# Patient Record
Sex: Male | Born: 1985 | Race: Black or African American | Hispanic: No | Marital: Single | Smoking: Current every day smoker
Health system: Southern US, Community
[De-identification: ages and names within clinical notes are randomized; demographics above are authoritative.]

## PROBLEM LIST (undated history)

## (undated) DIAGNOSIS — B20 Human immunodeficiency virus [HIV] disease: Secondary | ICD-10-CM

## (undated) DIAGNOSIS — F32A Depression, unspecified: Secondary | ICD-10-CM

## (undated) DIAGNOSIS — R569 Unspecified convulsions: Secondary | ICD-10-CM

## (undated) DIAGNOSIS — A6000 Herpesviral infection of urogenital system, unspecified: Secondary | ICD-10-CM

## (undated) DIAGNOSIS — F329 Major depressive disorder, single episode, unspecified: Secondary | ICD-10-CM

## (undated) DIAGNOSIS — G43909 Migraine, unspecified, not intractable, without status migrainosus: Secondary | ICD-10-CM

## (undated) DIAGNOSIS — F4310 Post-traumatic stress disorder, unspecified: Secondary | ICD-10-CM

## (undated) DIAGNOSIS — I10 Essential (primary) hypertension: Secondary | ICD-10-CM

## (undated) DIAGNOSIS — E871 Hypo-osmolality and hyponatremia: Secondary | ICD-10-CM

## (undated) DIAGNOSIS — R42 Dizziness and giddiness: Secondary | ICD-10-CM

## (undated) DIAGNOSIS — F319 Bipolar disorder, unspecified: Secondary | ICD-10-CM

## (undated) DIAGNOSIS — R627 Adult failure to thrive: Secondary | ICD-10-CM

## (undated) DIAGNOSIS — F259 Schizoaffective disorder, unspecified: Secondary | ICD-10-CM

## (undated) HISTORY — DX: Migraine, unspecified, not intractable, without status migrainosus: G43.909

## (undated) HISTORY — DX: Dizziness and giddiness: R42

## (undated) HISTORY — PX: BACK SURGERY: SHX140

## (undated) HISTORY — DX: Unspecified convulsions: R56.9

## (undated) HISTORY — PX: HAND SURGERY: SHX662

## (undated) HISTORY — DX: Herpesviral infection of urogenital system, unspecified: A60.00

---
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
## 2004-06-06 ENCOUNTER — Emergency Department (HOSPITAL_COMMUNITY): Admission: EM | Admit: 2004-06-06 | Discharge: 2004-06-06 | Payer: Self-pay | Admitting: Emergency Medicine

## 2005-05-07 ENCOUNTER — Emergency Department (HOSPITAL_COMMUNITY): Admission: EM | Admit: 2005-05-07 | Discharge: 2005-05-07 | Payer: Self-pay | Admitting: Emergency Medicine

## 2007-09-26 ENCOUNTER — Emergency Department (HOSPITAL_COMMUNITY): Admission: EM | Admit: 2007-09-26 | Discharge: 2007-09-26 | Payer: Self-pay | Admitting: Family Medicine

## 2008-08-22 ENCOUNTER — Emergency Department (HOSPITAL_COMMUNITY): Admission: EM | Admit: 2008-08-22 | Discharge: 2008-08-22 | Payer: Self-pay | Admitting: Emergency Medicine

## 2009-01-09 ENCOUNTER — Emergency Department (HOSPITAL_COMMUNITY): Admission: EM | Admit: 2009-01-09 | Discharge: 2009-01-09 | Payer: Self-pay | Admitting: Emergency Medicine

## 2009-03-01 ENCOUNTER — Emergency Department (HOSPITAL_COMMUNITY): Admission: AC | Admit: 2009-03-01 | Discharge: 2009-03-01 | Payer: Self-pay

## 2009-03-06 ENCOUNTER — Emergency Department (HOSPITAL_COMMUNITY): Admission: EM | Admit: 2009-03-06 | Discharge: 2009-03-06 | Payer: Self-pay | Admitting: Emergency Medicine

## 2009-05-16 ENCOUNTER — Emergency Department (HOSPITAL_COMMUNITY): Admission: EM | Admit: 2009-05-16 | Discharge: 2009-05-16 | Payer: Self-pay | Admitting: Family Medicine

## 2009-11-24 ENCOUNTER — Emergency Department (HOSPITAL_COMMUNITY): Admission: EM | Admit: 2009-11-24 | Discharge: 2009-11-24 | Payer: Self-pay | Admitting: Family Medicine

## 2009-12-18 ENCOUNTER — Emergency Department (HOSPITAL_COMMUNITY): Admission: EM | Admit: 2009-12-18 | Discharge: 2009-12-19 | Payer: Self-pay | Admitting: Emergency Medicine

## 2009-12-19 ENCOUNTER — Inpatient Hospital Stay (HOSPITAL_COMMUNITY): Admission: RE | Admit: 2009-12-19 | Discharge: 2009-12-22 | Payer: Self-pay | Admitting: Psychiatry

## 2009-12-19 ENCOUNTER — Ambulatory Visit: Payer: Self-pay | Admitting: Psychiatry

## 2010-01-29 ENCOUNTER — Ambulatory Visit: Payer: Self-pay | Admitting: Psychiatry

## 2010-01-29 ENCOUNTER — Inpatient Hospital Stay (HOSPITAL_COMMUNITY): Admission: EM | Admit: 2010-01-29 | Discharge: 2010-01-30 | Payer: Self-pay | Admitting: Psychiatry

## 2010-12-28 LAB — COMPREHENSIVE METABOLIC PANEL
ALT: 25 U/L (ref 0–53)
AST: 28 U/L (ref 0–37)
Albumin: 3.9 g/dL (ref 3.5–5.2)
Alkaline Phosphatase: 136 U/L — ABNORMAL HIGH (ref 39–117)
BUN: 11 mg/dL (ref 6–23)
CO2: 31 mEq/L (ref 19–32)
Calcium: 9.5 mg/dL (ref 8.4–10.5)
Chloride: 101 mEq/L (ref 96–112)
Creatinine, Ser: 1.25 mg/dL (ref 0.4–1.5)
GFR calc Af Amer: >60 mL/min (ref >60)
GFR calc non Af Amer: >60 mL/min (ref >60)
Glucose, Bld: 120 mg/dL — ABNORMAL HIGH (ref 70–99)
Potassium: 4.6 mEq/L (ref 3.5–5.1)
Sodium: 137 mEq/L (ref 135–145)
Total Bilirubin: 0.4 mg/dL (ref 0.3–1.2)
Total Protein: 7.8 g/dL (ref 6.0–8.3)

## 2010-12-28 LAB — CBC
HCT: 41.8 % (ref 39.0–52.0)
Hemoglobin: 13.6 g/dL (ref 13.0–17.0)
MCHC: 32.6 g/dL (ref 30.0–36.0)
MCV: 98.9 fL (ref 78.0–100.0)
Platelets: 261 10*3/uL (ref 150–400)
RBC: 4.23 MIL/uL (ref 4.22–5.81)
RDW: 13.7 % (ref 11.5–15.5)
WBC: 7.5 10*3/uL (ref 4.0–10.5)

## 2010-12-29 LAB — GC/CHLAMYDIA PROBE AMP, GENITAL
Chlamydia, DNA Probe: NEGATIVE
GC Probe Amp, Genital: POSITIVE — AB

## 2011-01-03 LAB — HEPATIC FUNCTION PANEL
ALT: 28 U/L (ref 0–53)
AST: 27 U/L (ref 0–37)
Albumin: 4.2 g/dL (ref 3.5–5.2)
Alkaline Phosphatase: 144 U/L — ABNORMAL HIGH (ref 39–117)
Bilirubin, Direct: <0.1 mg/dL (ref 0.0–0.3)
Total Bilirubin: 0.2 mg/dL — ABNORMAL LOW (ref 0.3–1.2)
Total Protein: 8.5 g/dL — ABNORMAL HIGH (ref 6.0–8.3)

## 2011-01-03 LAB — DIFFERENTIAL
Basophils Absolute: 0 10*3/uL (ref 0.0–0.1)
Basophils Relative: 1 % (ref 0–1)
Eosinophils Absolute: 0 10*3/uL (ref 0.0–0.7)
Eosinophils Relative: 1 % (ref 0–5)
Lymphocytes Relative: 44 % (ref 12–46)
Lymphs Abs: 2.6 10*3/uL (ref 0.7–4.0)
Monocytes Absolute: 0.5 10*3/uL (ref 0.1–1.0)
Monocytes Relative: 8 % (ref 3–12)
Neutro Abs: 2.8 10*3/uL (ref 1.7–7.7)
Neutrophils Relative %: 47 % (ref 43–77)

## 2011-01-03 LAB — CBC
HCT: 46.2 % (ref 39.0–52.0)
Hemoglobin: 14.8 g/dL (ref 13.0–17.0)
MCHC: 32.1 g/dL (ref 30.0–36.0)
MCV: 99.5 fL (ref 78.0–100.0)
Platelets: 325 10*3/uL (ref 150–400)
RBC: 4.64 MIL/uL (ref 4.22–5.81)
RDW: 13.8 % (ref 11.5–15.5)
WBC: 6 10*3/uL (ref 4.0–10.5)

## 2011-01-03 LAB — BASIC METABOLIC PANEL
BUN: 7 mg/dL (ref 6–23)
CO2: 26 mEq/L (ref 19–32)
Calcium: 9 mg/dL (ref 8.4–10.5)
Chloride: 106 mEq/L (ref 96–112)
Creatinine, Ser: 0.92 mg/dL (ref 0.4–1.5)
GFR calc Af Amer: >60 mL/min (ref >60)
GFR calc non Af Amer: >60 mL/min (ref >60)
Glucose, Bld: 111 mg/dL — ABNORMAL HIGH (ref 70–99)
Potassium: 3.9 mEq/L (ref 3.5–5.1)
Sodium: 142 mEq/L (ref 135–145)

## 2011-01-03 LAB — RAPID URINE DRUG SCREEN, HOSP PERFORMED
Amphetamines: NOT DETECTED
Barbiturates: NOT DETECTED
Benzodiazepines: NOT DETECTED
Cocaine: NOT DETECTED
Opiates: NOT DETECTED
Tetrahydrocannabinol: NOT DETECTED

## 2011-01-03 LAB — TRICYCLICS SCREEN, URINE: TCA Scrn: NOT DETECTED

## 2011-01-03 LAB — LIPASE, BLOOD: Lipase: 23 U/L (ref 11–59)

## 2011-01-03 LAB — ETHANOL
Alcohol, Ethyl (B): 27 mg/dL — ABNORMAL HIGH (ref 0–10)
Alcohol, Ethyl (B): 381 mg/dL — ABNORMAL HIGH (ref 0–10)

## 2011-01-14 ENCOUNTER — Emergency Department (HOSPITAL_COMMUNITY)
Admission: EM | Admit: 2011-01-14 | Discharge: 2011-01-14 | Disposition: A | Discharge status: Home / Self Care | Payer: Medicaid Other | Attending: Emergency Medicine | Admitting: Emergency Medicine

## 2011-01-14 DIAGNOSIS — R209 Unspecified disturbances of skin sensation: Secondary | ICD-10-CM | POA: Insufficient documentation

## 2011-01-14 DIAGNOSIS — F341 Dysthymic disorder: Secondary | ICD-10-CM | POA: Insufficient documentation

## 2011-01-14 DIAGNOSIS — Z21 Asymptomatic human immunodeficiency virus [HIV] infection status: Secondary | ICD-10-CM | POA: Insufficient documentation

## 2011-01-14 DIAGNOSIS — M545 Low back pain, unspecified: Secondary | ICD-10-CM | POA: Insufficient documentation

## 2011-01-14 DIAGNOSIS — M5380 Other specified dorsopathies, site unspecified: Secondary | ICD-10-CM | POA: Insufficient documentation

## 2011-01-17 ENCOUNTER — Emergency Department (HOSPITAL_COMMUNITY)
Admission: EM | Admit: 2011-01-17 | Discharge: 2011-01-18 | Disposition: A | Discharge status: Home / Self Care | Payer: Medicaid Other | Attending: Emergency Medicine | Admitting: Emergency Medicine

## 2011-01-17 DIAGNOSIS — Z79899 Other long term (current) drug therapy: Secondary | ICD-10-CM | POA: Insufficient documentation

## 2011-01-17 DIAGNOSIS — R443 Hallucinations, unspecified: Secondary | ICD-10-CM | POA: Insufficient documentation

## 2011-01-17 DIAGNOSIS — T50901A Poisoning by unspecified drugs, medicaments and biological substances, accidental (unintentional), initial encounter: Secondary | ICD-10-CM | POA: Insufficient documentation

## 2011-01-17 DIAGNOSIS — F1010 Alcohol abuse, uncomplicated: Secondary | ICD-10-CM | POA: Insufficient documentation

## 2011-01-17 DIAGNOSIS — T50902A Poisoning by unspecified drugs, medicaments and biological substances, intentional self-harm, initial encounter: Secondary | ICD-10-CM | POA: Insufficient documentation

## 2011-01-17 DIAGNOSIS — F259 Schizoaffective disorder, unspecified: Secondary | ICD-10-CM

## 2011-01-17 DIAGNOSIS — Z21 Asymptomatic human immunodeficiency virus [HIV] infection status: Secondary | ICD-10-CM | POA: Insufficient documentation

## 2011-01-17 LAB — CBC
HCT: 42.8 % (ref 39.0–52.0)
Hemoglobin: 14 g/dL (ref 13.0–17.0)
MCH: 32.6 pg (ref 26.0–34.0)
MCHC: 32.7 g/dL (ref 30.0–36.0)
MCV: 99.5 fL (ref 78.0–100.0)
Platelets: 247 10*3/uL (ref 150–400)
RBC: 4.3 MIL/uL (ref 4.22–5.81)
RDW: 13.9 % (ref 11.5–15.5)
WBC: 7.6 10*3/uL (ref 4.0–10.5)

## 2011-01-17 LAB — URINALYSIS, ROUTINE W REFLEX MICROSCOPIC
Bilirubin Urine: NEGATIVE
Glucose, UA: NEGATIVE mg/dL
Hgb urine dipstick: NEGATIVE
Ketones, ur: NEGATIVE mg/dL
Nitrite: NEGATIVE
Protein, ur: NEGATIVE mg/dL
Specific Gravity, Urine: 1.008 (ref 1.005–1.030)
Urobilinogen, UA: 0.2 mg/dL (ref 0.0–1.0)
pH: 5.5 (ref 5.0–8.0)

## 2011-01-17 LAB — DIFFERENTIAL
Basophils Absolute: 0.1 10*3/uL (ref 0.0–0.1)
Basophils Relative: 1 % (ref 0–1)
Eosinophils Absolute: 0.1 10*3/uL (ref 0.0–0.7)
Eosinophils Relative: 1 % (ref 0–5)
Lymphocytes Relative: 46 % (ref 12–46)
Lymphs Abs: 3.5 10*3/uL (ref 0.7–4.0)
Monocytes Absolute: 0.5 10*3/uL (ref 0.1–1.0)
Monocytes Relative: 6 % (ref 3–12)
Neutro Abs: 3.5 10*3/uL (ref 1.7–7.7)
Neutrophils Relative %: 46 % (ref 43–77)

## 2011-01-17 LAB — COMPREHENSIVE METABOLIC PANEL
ALT: 22 U/L (ref 0–53)
AST: 29 U/L (ref 0–37)
Albumin: 4.1 g/dL (ref 3.5–5.2)
Alkaline Phosphatase: 116 U/L (ref 39–117)
BUN: 10 mg/dL (ref 6–23)
CO2: 24 mEq/L (ref 19–32)
Calcium: 9.7 mg/dL (ref 8.4–10.5)
Chloride: 108 mEq/L (ref 96–112)
Creatinine, Ser: 1.17 mg/dL (ref 0.4–1.5)
GFR calc Af Amer: >60 mL/min (ref >60)
GFR calc non Af Amer: >60 mL/min (ref >60)
Glucose, Bld: 113 mg/dL — ABNORMAL HIGH (ref 70–99)
Potassium: 3.8 mEq/L (ref 3.5–5.1)
Sodium: 143 mEq/L (ref 135–145)
Total Bilirubin: 0.3 mg/dL (ref 0.3–1.2)
Total Protein: 7.7 g/dL (ref 6.0–8.3)

## 2011-01-17 LAB — RAPID URINE DRUG SCREEN, HOSP PERFORMED
Amphetamines: NOT DETECTED
Barbiturates: NOT DETECTED
Benzodiazepines: NOT DETECTED
Cocaine: NOT DETECTED
Opiates: NOT DETECTED
Tetrahydrocannabinol: NOT DETECTED

## 2011-01-17 LAB — ACETAMINOPHEN LEVEL: Acetaminophen (Tylenol), Serum: <10 ug/mL — ABNORMAL LOW (ref 10–30)

## 2011-01-17 LAB — SALICYLATE LEVEL: Salicylate Lvl: <4 mg/dL (ref 2.8–20.0)

## 2011-01-17 LAB — ETHANOL: Alcohol, Ethyl (B): 283 mg/dL — ABNORMAL HIGH (ref 0–10)

## 2011-01-17 LAB — LIPASE, BLOOD: Lipase: 28 U/L (ref 11–59)

## 2011-01-18 DIAGNOSIS — F259 Schizoaffective disorder, unspecified: Secondary | ICD-10-CM

## 2011-01-18 LAB — HIV-1 RNA QUANT-NO REFLEX-BLD
HIV 1 RNA Quant: <48 copies/mL (ref <48)
HIV-1 RNA Quant, Log: <1.68 {Log} (ref <1.68)

## 2011-01-18 LAB — POCT I-STAT, CHEM 8
BUN: 9 mg/dL (ref 6–23)
Calcium, Ion: 1 mmol/L — ABNORMAL LOW (ref 1.12–1.32)
Chloride: 108 mEq/L (ref 96–112)
Creatinine, Ser: 1.4 mg/dL (ref 0.4–1.5)
Glucose, Bld: 184 mg/dL — ABNORMAL HIGH (ref 70–99)
HCT: 43 % (ref 39.0–52.0)
Hemoglobin: 14.6 g/dL (ref 13.0–17.0)
Potassium: 3.3 mEq/L — ABNORMAL LOW (ref 3.5–5.1)
Sodium: 139 mEq/L (ref 135–145)
TCO2: 12 mmol/L (ref 0–100)

## 2011-01-18 LAB — CBC
HCT: 39.5 % (ref 39.0–52.0)
Hemoglobin: 13.5 g/dL (ref 13.0–17.0)
MCHC: 34.2 g/dL (ref 30.0–36.0)
MCV: 96.4 fL (ref 78.0–100.0)
Platelets: 309 10*3/uL (ref 150–400)
RBC: 4.1 MIL/uL — ABNORMAL LOW (ref 4.22–5.81)
RDW: 13.5 % (ref 11.5–15.5)
WBC: 7.5 10*3/uL (ref 4.0–10.5)

## 2011-01-18 LAB — TYPE AND SCREEN
ABO/RH(D): O POS
Antibody Screen: NEGATIVE

## 2011-01-18 LAB — ABO/RH: ABO/RH(D): O POS

## 2011-01-18 LAB — LACTIC ACID, PLASMA: Lactic Acid, Venous: 11.2 mmol/L — ABNORMAL HIGH (ref 0.5–2.2)

## 2011-01-18 LAB — PROTIME-INR
INR: 1.1 (ref 0.00–1.49)
Prothrombin Time: 14.4 seconds (ref 11.6–15.2)
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
## 2013-11-06 ENCOUNTER — Other Ambulatory Visit (HOSPITAL_COMMUNITY): Payer: Self-pay | Admitting: Internal Medicine

## 2013-11-06 DIAGNOSIS — R569 Unspecified convulsions: Secondary | ICD-10-CM

## 2013-11-12 ENCOUNTER — Ambulatory Visit (HOSPITAL_COMMUNITY): Payer: Medicaid Other

## 2013-11-19 ENCOUNTER — Ambulatory Visit (HOSPITAL_COMMUNITY): Payer: Medicaid Other

## 2013-11-20 ENCOUNTER — Ambulatory Visit (HOSPITAL_COMMUNITY)
Admission: RE | Admit: 2013-11-20 | Discharge: 2013-11-20 | Disposition: A | Discharge status: Home / Self Care | Payer: Medicaid Other | Source: Ambulatory Visit | Attending: Internal Medicine | Admitting: Internal Medicine

## 2013-11-20 DIAGNOSIS — R569 Unspecified convulsions: Secondary | ICD-10-CM | POA: Insufficient documentation

## 2013-11-20 NOTE — Progress Notes (Signed)
EEG completed; results pending.    

## 2013-11-21 NOTE — Procedures (Signed)
ELECTROENCEPHALOGRAM REPORT   Patient: [name]       Room #: OP EEG No. ID: [id number] Age: 28 y.o.        Sex: male Referring Physician: Avbuere Report Date:  11/20/2013        Interpreting Physician: Alexis Goodell D  History: Lorne Skeens Beem is an 28 y.o. male with a history of seizures.  Last seizure was 6 months ago.  Medications:  None  Conditions of Recording:  This is a 16 channel EEG carried out with the patient in the awake state.  Description:  The waking background activity consists of a low voltage, symmetrical, fairly well organized, 9 Hz alpha activity, seen from the parieto-occipital and posterior temporal regions.  Low voltage fast activity, poorly organized, is seen anteriorly and is at times superimposed on more posterior regions.  A mixture of theta and alpha rhythms are seen from the central and temporal regions. The patient does not drowse or sleep. Hyperventilation produced a mild to moderate buildup but failed to elicit any abnormalities.  Intermittent photic stimulation was performed but failed to illicit any change in the tracing.   IMPRESSION: This is a normal awake electroencephalogram  Comment:  An EEG with the patient sleep deprived to elicit drowse and light sleep may be desirable to further elicit a possible seizure disorder.    Alexis Goodell, MD [facility] [phone] 11/21/2013, 8:21 AM

## 2014-01-11 ENCOUNTER — Encounter (HOSPITAL_COMMUNITY): Payer: Self-pay | Admitting: Emergency Medicine

## 2014-01-11 DIAGNOSIS — Z79899 Other long term (current) drug therapy: Secondary | ICD-10-CM | POA: Insufficient documentation

## 2014-01-11 DIAGNOSIS — F17200 Nicotine dependence, unspecified, uncomplicated: Secondary | ICD-10-CM | POA: Insufficient documentation

## 2014-01-11 DIAGNOSIS — R4182 Altered mental status, unspecified: Secondary | ICD-10-CM | POA: Insufficient documentation

## 2014-01-11 DIAGNOSIS — R45851 Suicidal ideations: Secondary | ICD-10-CM | POA: Insufficient documentation

## 2014-01-11 DIAGNOSIS — Z21 Asymptomatic human immunodeficiency virus [HIV] infection status: Secondary | ICD-10-CM | POA: Insufficient documentation

## 2014-01-11 DIAGNOSIS — I10 Essential (primary) hypertension: Secondary | ICD-10-CM | POA: Insufficient documentation

## 2014-01-11 DIAGNOSIS — F4310 Post-traumatic stress disorder, unspecified: Secondary | ICD-10-CM | POA: Insufficient documentation

## 2014-01-11 DIAGNOSIS — F3289 Other specified depressive episodes: Secondary | ICD-10-CM | POA: Insufficient documentation

## 2014-01-11 DIAGNOSIS — F10988 Alcohol use, unspecified with other alcohol-induced disorder: Secondary | ICD-10-CM | POA: Insufficient documentation

## 2014-01-11 DIAGNOSIS — F259 Schizoaffective disorder, unspecified: Secondary | ICD-10-CM | POA: Insufficient documentation

## 2014-01-11 DIAGNOSIS — F329 Major depressive disorder, single episode, unspecified: Secondary | ICD-10-CM | POA: Insufficient documentation

## 2014-01-11 NOTE — ED Notes (Signed)
Pt states he became suicidal a few hours ago, pt stated he was going to take a knife & stab himself in the heart.

## 2014-01-12 ENCOUNTER — Emergency Department (HOSPITAL_COMMUNITY)
Admission: EM | Admit: 2014-01-12 | Discharge: 2014-01-12 | Disposition: A | Discharge status: Home / Self Care | Payer: Medicare HMO | Attending: Emergency Medicine | Admitting: Emergency Medicine

## 2014-01-12 ENCOUNTER — Encounter (HOSPITAL_COMMUNITY): Payer: Self-pay | Admitting: *Deleted

## 2014-01-12 DIAGNOSIS — F332 Major depressive disorder, recurrent severe without psychotic features: Secondary | ICD-10-CM

## 2014-01-12 DIAGNOSIS — F1010 Alcohol abuse, uncomplicated: Secondary | ICD-10-CM

## 2014-01-12 DIAGNOSIS — R45851 Suicidal ideations: Secondary | ICD-10-CM

## 2014-01-12 DIAGNOSIS — F1099 Alcohol use, unspecified with unspecified alcohol-induced disorder: Secondary | ICD-10-CM | POA: Diagnosis present

## 2014-01-12 DIAGNOSIS — F1994 Other psychoactive substance use, unspecified with psychoactive substance-induced mood disorder: Secondary | ICD-10-CM

## 2014-01-12 HISTORY — DX: Essential (primary) hypertension: I10

## 2014-01-12 HISTORY — DX: Depression, unspecified: F32.A

## 2014-01-12 HISTORY — DX: Major depressive disorder, single episode, unspecified: F32.9

## 2014-01-12 HISTORY — DX: Post-traumatic stress disorder, unspecified: F43.10

## 2014-01-12 HISTORY — DX: Bipolar disorder, unspecified: F31.9

## 2014-01-12 HISTORY — DX: Schizoaffective disorder, unspecified: F25.9

## 2014-01-12 HISTORY — DX: Human immunodeficiency virus (HIV) disease: B20

## 2014-01-12 LAB — COMPREHENSIVE METABOLIC PANEL
ALT: 16 U/L (ref 0–53)
AST: 27 U/L (ref 0–37)
Albumin: 3.8 g/dL (ref 3.5–5.2)
Alkaline Phosphatase: 100 U/L (ref 39–117)
BUN: 9 mg/dL (ref 6–23)
CO2: 22 mEq/L (ref 19–32)
Calcium: 9.3 mg/dL (ref 8.4–10.5)
Chloride: 102 mEq/L (ref 96–112)
Creatinine, Ser: 1.27 mg/dL (ref 0.50–1.35)
GFR calc Af Amer: 86 mL/min — ABNORMAL LOW (ref >90)
GFR calc non Af Amer: 75 mL/min — ABNORMAL LOW (ref >90)
Glucose, Bld: 123 mg/dL — ABNORMAL HIGH (ref 70–99)
Potassium: 4.2 mEq/L (ref 3.7–5.3)
Sodium: 142 mEq/L (ref 137–147)
Total Bilirubin: 0.2 mg/dL — ABNORMAL LOW (ref 0.3–1.2)
Total Protein: 9.2 g/dL — ABNORMAL HIGH (ref 6.0–8.3)

## 2014-01-12 LAB — CBC
HCT: 47.6 % (ref 39.0–52.0)
Hemoglobin: 15.8 g/dL (ref 13.0–17.0)
MCH: 32.2 pg (ref 26.0–34.0)
MCHC: 33.2 g/dL (ref 30.0–36.0)
MCV: 96.9 fL (ref 78.0–100.0)
Platelets: 326 10*3/uL (ref 150–400)
RBC: 4.91 MIL/uL (ref 4.22–5.81)
RDW: 14.3 % (ref 11.5–15.5)
WBC: 12.1 10*3/uL — ABNORMAL HIGH (ref 4.0–10.5)

## 2014-01-12 LAB — ETHANOL
Alcohol, Ethyl (B): 200 mg/dL — ABNORMAL HIGH (ref 0–11)
Alcohol, Ethyl (B): 344 mg/dL — ABNORMAL HIGH (ref 0–11)

## 2014-01-12 LAB — RAPID URINE DRUG SCREEN, HOSP PERFORMED
Amphetamines: NOT DETECTED
Barbiturates: NOT DETECTED
Benzodiazepines: NOT DETECTED
Cocaine: NOT DETECTED
Opiates: NOT DETECTED
Tetrahydrocannabinol: NOT DETECTED

## 2014-01-12 MED ORDER — ZIPRASIDONE MESYLATE 20 MG IM SOLR
INTRAMUSCULAR | Status: AC
  Filled 2014-01-12: qty 20

## 2014-01-12 MED ORDER — STERILE WATER FOR INJECTION IJ SOLN
INTRAMUSCULAR | Status: AC
  Administered 2014-01-12: 01:00:00
  Filled 2014-01-12: qty 10

## 2014-01-12 MED ORDER — NICOTINE 21 MG/24HR TD PT24
MEDICATED_PATCH | TRANSDERMAL | Status: AC
  Filled 2014-01-12: qty 1

## 2014-01-12 MED ORDER — RILPIVIRINE HCL 25 MG PO TABS
25.0000 mg | ORAL_TABLET | Freq: Every day | ORAL | Status: DC
  Administered 2014-01-12: 25 mg via ORAL
  Filled 2014-01-12 (×2): qty 1

## 2014-01-12 MED ORDER — NICOTINE 21 MG/24HR TD PT24
21.0000 mg | MEDICATED_PATCH | Freq: Once | TRANSDERMAL | Status: DC
Start: 2014-01-12 — End: 2014-01-12
  Administered 2014-01-12: 21 mg via TRANSDERMAL

## 2014-01-12 MED ORDER — ZIPRASIDONE MESYLATE 20 MG IM SOLR
20.0000 mg | Freq: Once | INTRAMUSCULAR | Status: AC
  Administered 2014-01-12: 20 mg via INTRAMUSCULAR

## 2014-01-12 MED ORDER — EMTRICITAB-RILPIVIR-TENOFOV DF 200-25-300 MG PO TABS
1.0000 | ORAL_TABLET | Freq: Every day | ORAL | Status: DC
  Filled 2014-01-12 (×2): qty 1

## 2014-01-12 MED ORDER — DOXEPIN HCL 10 MG PO CAPS
10.0000 mg | ORAL_CAPSULE | Freq: Every day | ORAL | Status: DC
  Filled 2014-01-12: qty 1

## 2014-01-12 MED ORDER — CYCLOBENZAPRINE HCL 10 MG PO TABS
10.0000 mg | ORAL_TABLET | Freq: Once | ORAL | Status: AC
  Administered 2014-01-12: 10 mg via ORAL
  Filled 2014-01-12: qty 1

## 2014-01-12 MED ORDER — EMTRICITABINE-TENOFOVIR DF 200-300 MG PO TABS
1.0000 | ORAL_TABLET | Freq: Every day | ORAL | Status: DC
  Administered 2014-01-12: 1 via ORAL
  Filled 2014-01-12 (×2): qty 1

## 2014-01-12 MED ORDER — LURASIDONE HCL 40 MG PO TABS
80.0000 mg | ORAL_TABLET | Freq: Every day | ORAL | Status: DC
  Administered 2014-01-12: 80 mg via ORAL
  Filled 2014-01-12 (×2): qty 1

## 2014-01-12 MED ORDER — TRAZODONE HCL 50 MG PO TABS: 300.0000 mg | ORAL_TABLET | Freq: Every day | ORAL | Status: DC

## 2014-01-12 MED ORDER — VALACYCLOVIR HCL 500 MG PO TABS
500.0000 mg | ORAL_TABLET | Freq: Every day | ORAL | Status: DC
  Administered 2014-01-12: 500 mg via ORAL
  Filled 2014-01-12: qty 1

## 2014-01-12 NOTE — ED Notes (Signed)
Pt alert, ambulatory to restroom,  [employer] at bedside, pt given breakfast tray, states "I am ready to go home", denies any thoughts of SI or HI, process of psychiatric care in er explained to pt, advised pt that he would have a tele psych performed this am, pt states " I just made those statements last night because I was drunk and did not feel safe at home".

## 2014-01-12 NOTE — ED Notes (Signed)
Pt received discharge instructions, verbalized understanding and has no further questions. Pt ambulated to exit in stable condition.  Advised to return to emergency department with new or worsening symptoms.

## 2014-01-12 NOTE — ED Provider Notes (Signed)
CSN: [id number]     Arrival date & time 01/11/14  2335 History  This chart was scribed for [name], MD by Jenne Campus, ED Scribe. This patient was seen in room APA17/APA17 and the patient's care was started at 12:19 AM.   Chief Complaint  Patient presents with  . V70.1   Level 5 Caveat-Intoxication  The history is provided by the patient. No language interpreter was used.    HPI Comments: QUINTAVIUS NIEBUHR is a 28 y.o. male with a h/o HIV and mental disorders who presents to the Emergency Department in [employer] custody for reports of being SI. Pt states that he had an episode of auditory hallucinations tonight telling him to kill himself. He then called mobile crisis who then contacted the [employer]. Pt states that next time this happens "I will just kill myself". He denies any self harm attempts tonight or self harm. He denies any overdose on medications.He reports prior admissions for the same. He denies any illegal drug use. He denies SI currently. He is currently being treated for HIV and mental health disorders; although, he can't tell me what medications he is on. He is currently requesting rehab for EtOH abuse. Due to his intoxicated state he is unable to answer any further questions.   He is seen at [facility] for his mental health disorders currently.   Past Medical History  Diagnosis Date  . Hypertension   . HIV disease   . PTSD (post-traumatic stress disorder)   . Bipolar 1 disorder   . Schizoaffective disorder    Past Surgical History  Procedure Laterality Date  . Hand surgery    . Back surgery     No family history on file. History  Substance Use Topics  . Smoking status: Current Every Day Smoker    Types: Cigarettes  . Smokeless tobacco: Not on file  . Alcohol Use: Yes    Review of Systems  Unable to perform ROS: Mental status change    Allergies  Review of patient's allergies indicates no known allergies.  Home Medications   Current Outpatient Rx  Name  Route   Sig  Dispense  Refill  . cyclobenzaprine (FLEXERIL) 10 MG tablet   Oral   Take 10 mg by mouth 3 (three) times daily as needed for muscle spasms.         [name] doxepin (SINEQUAN) 10 MG capsule   Oral   Take 10 mg by mouth at bedtime.         [name] Emtricitab-Rilpivir-Tenofovir (COMPLERA) 200-25-300 MG TABS   Oral   Take 1 tablet by mouth daily.         [name] lurasidone (LATUDA) 80 MG TABS tablet   Oral   Take 80 mg by mouth daily with breakfast.         . trazodone (DESYREL) 300 MG tablet   Oral   Take 300 mg by mouth at bedtime.         . valACYclovir (VALTREX) 500 MG tablet   Oral   Take 500 mg by mouth daily.          Triage Vitals: Pulse 128  Temp(Src) 98.1 F (36.7 C) (Oral)  Resp 20  Ht 6' 3"  (1.905 m)  Wt 260 lb (117.935 kg)  BMI 32.50 kg/m2  SpO2 98%  Physical Exam  Nursing note and vitals reviewed. Constitutional: He is oriented to person, place, and time. He appears well-developed and well-nourished. No distress.  HENT:  Head: Normocephalic and  atraumatic.  Eyes: EOM are normal.  Neck: Neck supple. No tracheal deviation present.  Cardiovascular: Normal rate.   Pulmonary/Chest: Effort normal. No respiratory distress.  Musculoskeletal: Normal range of motion.  Neurological: He is alert and oriented to person, place, and time.  Skin: Skin is warm and dry.  Psychiatric:  Intoxicated, tangential thinking     ED Course  Procedures (including critical care time)  DIAGNOSTIC STUDIES: Oxygen Saturation is 98% on RA, normal by my interpretation.    COORDINATION OF CARE: 12:19 AM-Discussed treatment plan which includes with pt at bedside and pt agreed to plan.   12:46 AM- PT remains in handcuffs. He is no becoming disruptive yelling profanities and requiring Geodon.   Labs Review Labs Reviewed  CBC - Abnormal; Notable for the following:    WBC 12.1 (*)    All other components within normal limits  COMPREHENSIVE METABOLIC PANEL - Abnormal; Notable for  the following:    Glucose, Bld 123 (*)    Total Protein 9.2 (*)    Total Bilirubin 0.2 (*)    GFR calc non Af Amer 75 (*)    GFR calc Af Amer 86 (*)    All other components within normal limits  ETHANOL - Abnormal; Notable for the following:    Alcohol, Ethyl (B) 344 (*)    All other components within normal limits  URINE RAPID DRUG SCREEN (HOSP PERFORMED)  ETHANOL   Imaging Review No results found.  CRITICAL CARE Performed by: [name] Total critical care time: 40 Critical care time was exclusive of separately billable procedures and treating other patients. Critical care was necessary to treat or prevent imminent or life-threatening deterioration. Critical care was time spent personally by me on the following activities: development of treatment plan with patient and/or surrogate as well as nursing, discussions with consultants, evaluation of patient's response to treatment, examination of patient, obtaining history from patient or surrogate, ordering and performing treatments and interventions, ordering and review of laboratory studies, ordering and review of radiographic studies, pulse oximetry and re-evaluation of patient's condition. Geodon required for violent outbursts, uncooperative and unruly behavior. Patient handcuffed by police/ requiring physical and chemical restraints.   Plan labs, patient to sober, and TTS consult for psych dispo  MDM   Diagnosis: Alcohol intoxication, suicidal ideation, history of bipolar  After Geodon provided, patient resting. No further violent outbursts. Labs reviewed as above, alcohol level 344   I personally performed the services described in this documentation, which was scribed in my presence. The recorded information has been reviewed and is accurate.      [name], MD 01/12/14 0630

## 2014-01-12 NOTE — ED Notes (Signed)
Pt yelling, using profanity towards staff & police department. Pt making demands. Instructions given to pt on procedures being done at this time. Pt not listening or cooperating.

## 2014-01-12 NOTE — ED Notes (Signed)
Pt pending second tele psych evaluation for possible discharge, sitter remains at bedside,

## 2014-01-12 NOTE — ED Notes (Signed)
Pt yelling & screaming in the room. Pt stating he getting the fuck out of here if he does not get to [facility]. EDP in hallway & order given.

## 2014-01-12 NOTE — BH Assessment (Signed)
TTS completed.  Pt calm/cooperative, apologetic for his actions last night, denies SI/HI, requesting d/c.  EDP notified.  Pt pending Telepsych w/ MD or extender for final dispo.

## 2014-01-12 NOTE — Discharge Instructions (Signed)

## 2014-01-12 NOTE — ED Notes (Signed)
Pt walked to restroom w/ [employer]. Pt apologized for the way he acted stating he was drunk. Pt calm at this time. Pt given nicotine patch & some water.

## 2014-01-12 NOTE — ED Notes (Signed)
Behavioral health contacted reference time frame for provider assessment, advised time frame unknown, pt updated,
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
## 2014-01-12 NOTE — ED Notes (Signed)
Behavioral health contacted reference time frame for assessment, advised that pt is "on the list"

## 2014-01-12 NOTE — ED Notes (Signed)
MD notified of elevated heart rate. Pt reports feeling anxious to leave. Pt cleared for discharge by MD.

## 2014-01-12 NOTE — ED Notes (Signed)
Pt cooperative at present, denies any SI/HI, pending tele psych by provider, pt updated on plan of care,

## 2014-01-12 NOTE — ED Notes (Signed)
Pharmacy contacted reference morning medication, advised that pt's morning medication was "on the way"

## 2014-01-12 NOTE — ED Notes (Signed)
Pt ambulatory to restroom with [facility] at bedside,
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
## 2014-01-12 NOTE — ED Provider Notes (Signed)
  Physical Exam  BP 118/79  Pulse 110  Temp(Src) 98.3 F (36.8 C) (Oral)  Resp 20  Ht 6' 3"  (1.905 m)  Wt 260 lb (117.935 kg)  BMI 32.50 kg/m2  SpO2 98%  Physical Exam  ED Course  Procedures  MDM Patient is awake and appropriate. He states no longer suicidal. CT was just drunk. Patient has history of doing this. He has acting followup at home. Patient was seen by TTS. Psychiatry recommended that the patient be allowed to leave if he wanted to but otherwise try and keep him that for 12 more hours for supportive care. Patient does not want to stay in the ER.      [name]. Alvino Chapel, MD 01/12/14 1315

## 2014-04-01 ENCOUNTER — Ambulatory Visit (HOSPITAL_COMMUNITY): Payer: Self-pay | Admitting: Psychiatry

## 2014-04-21 ENCOUNTER — Encounter (HOSPITAL_COMMUNITY): Payer: Self-pay | Admitting: Emergency Medicine

## 2014-04-21 ENCOUNTER — Emergency Department (HOSPITAL_COMMUNITY)
Admission: EM | Admit: 2014-04-21 | Discharge: 2014-04-21 | Discharge status: Against Medical Advice | Payer: Medicare HMO | Attending: Emergency Medicine | Admitting: Emergency Medicine

## 2014-04-21 DIAGNOSIS — F419 Anxiety disorder, unspecified: Secondary | ICD-10-CM

## 2014-04-21 DIAGNOSIS — Z79899 Other long term (current) drug therapy: Secondary | ICD-10-CM | POA: Insufficient documentation

## 2014-04-21 DIAGNOSIS — Z21 Asymptomatic human immunodeficiency virus [HIV] infection status: Secondary | ICD-10-CM | POA: Insufficient documentation

## 2014-04-21 DIAGNOSIS — F411 Generalized anxiety disorder: Secondary | ICD-10-CM | POA: Insufficient documentation

## 2014-04-21 DIAGNOSIS — F319 Bipolar disorder, unspecified: Secondary | ICD-10-CM | POA: Insufficient documentation

## 2014-04-21 DIAGNOSIS — F209 Schizophrenia, unspecified: Secondary | ICD-10-CM | POA: Insufficient documentation

## 2014-04-21 DIAGNOSIS — I10 Essential (primary) hypertension: Secondary | ICD-10-CM | POA: Insufficient documentation

## 2014-04-21 DIAGNOSIS — R000 Tachycardia, unspecified: Secondary | ICD-10-CM | POA: Insufficient documentation

## 2014-04-21 DIAGNOSIS — F17200 Nicotine dependence, unspecified, uncomplicated: Secondary | ICD-10-CM | POA: Insufficient documentation

## 2014-04-21 LAB — I-STAT CHEM 8, ED
BUN: 11 mg/dL (ref 6–23)
Calcium, Ion: 1.17 mmol/L (ref 1.12–1.23)
Chloride: 100 mEq/L (ref 96–112)
Creatinine, Ser: 1.5 mg/dL — ABNORMAL HIGH (ref 0.50–1.35)
Glucose, Bld: 105 mg/dL — ABNORMAL HIGH (ref 70–99)
HCT: 50 % (ref 39.0–52.0)
Hemoglobin: 17 g/dL (ref 13.0–17.0)
Potassium: 3.4 mEq/L — ABNORMAL LOW (ref 3.7–5.3)
Sodium: 141 mEq/L (ref 137–147)
TCO2: 21 mmol/L (ref 0–100)

## 2014-04-21 MED ORDER — SODIUM CHLORIDE 0.9 % IV BOLUS (SEPSIS): 1000.0000 mL | Freq: Once | INTRAVENOUS | Status: DC

## 2014-04-21 NOTE — ED Notes (Addendum)
Pt states "need to leave, feeling better". Dr. Elise Benne notified, at pts bedside explaining risks and benefits of leaving and staying. Pt acknowledged understanding, will still like to leave. AMA form signed by pt.  Pt A&O, NAD, IV removed and bandaid applied. Pt able to ambulate without difficulties.

## 2014-04-21 NOTE — ED Notes (Signed)
Pt very anxious in room. Pt states has dizziness, tremors and shortness of breath since this morning.

## 2014-04-21 NOTE — ED Notes (Signed)
C/o dizziness that started to and c/o " feelings of going to pass out"

## 2014-04-21 NOTE — ED Provider Notes (Signed)
CSN: [id number]     Arrival date & time 04/21/14  1251 History   First MD Initiated Contact with Patient 04/21/14 1507     Chief Complaint  Patient presents with  . Dizziness      HPI Pt was seen at 1510. Per pt, c/o gradual onset and resolution of one episode of "anxiety" that occurred after he woke up this morning. Pt describes his symptoms as feeling "dizzy," "having tremors," and "dry mouth."  Pt states he "feels better" since he came to the ED for evaluation. Denies CP/palpitations, no SOB/cough, no abd pain, no N/V/D, no back pain, no focal motor weakness, no tingling/numbness in extremities, no syncope.    Past Medical History  Diagnosis Date  . Hypertension   . HIV disease   . PTSD (post-traumatic stress disorder)   . Bipolar 1 disorder   . Schizoaffective disorder   . Depression    Past Surgical History  Procedure Laterality Date  . Hand surgery    . Back surgery      History  Substance Use Topics  . Smoking status: Current Every Day Smoker    Types: Cigarettes  . Smokeless tobacco: Not on file  . Alcohol Use: 1.8 oz/week    3 Cans of beer per week    Review of Systems ROS: Statement: All systems negative except as marked or noted in the HPI; Constitutional: Negative for fever and chills. ; ; Eyes: Negative for eye pain, redness and discharge. ; ; ENMT: +"dry mouth." Negative for ear pain, hoarseness, nasal congestion, sinus pressure and sore throat. ; ; Cardiovascular: Negative for chest pain, palpitations, diaphoresis, dyspnea and peripheral edema. ; ; Respiratory: Negative for cough, wheezing and stridor. ; ; Gastrointestinal: Negative for nausea, vomiting, diarrhea, abdominal pain, blood in stool, hematemesis, jaundice and rectal bleeding. . ; ; Genitourinary: Negative for dysuria, flank pain and hematuria. ; ; Musculoskeletal: Negative for back pain and neck pain. Negative for swelling and trauma.; ; Skin: Negative for pruritus, rash, abrasions, blisters, bruising  and skin lesion.; ; Neuro: +"dizzy." Negative for headache, lightheadedness and neck stiffness. Negative for weakness, altered level of consciousness , altered mental status, extremity weakness, paresthesias, involuntary movement, seizure and syncope. ;Psych:  +anxiety. No SI, no SA, no HI, no hallucinations.     Allergies  Review of patient's allergies indicates no known allergies.  Home Medications   Prior to Admission medications   Medication Sig Start Date End Date Taking? Authorizing Provider  acetaminophen-codeine (TYLENOL #3) 300-30 MG per tablet Take 1 tablet by mouth every 4 (four) hours as needed for moderate pain (for chronic back and leg pain,).   Yes Historical Provider, MD  albuterol (PROVENTIL HFA;VENTOLIN HFA) 108 (90 BASE) MCG/ACT inhaler Inhale 2 puffs into the lungs every 6 (six) hours as needed for wheezing or shortness of breath.   Yes Historical Provider, MD  cyclobenzaprine (FLEXERIL) 10 MG tablet Take 10 mg by mouth 3 (three) times daily as needed for muscle spasms.   Yes Historical Provider, MD  DiphenhydrAMINE HCl (BENADRYL PO) Take 50 mg by mouth 2 (two) times daily.   Yes Historical Provider, MD  doxepin (SINEQUAN) 10 MG capsule Take 10 mg by mouth at bedtime.   Yes Historical Provider, MD  Emtricitab-Rilpivir-Tenofovir (COMPLERA) 200-25-300 MG TABS Take 1 tablet by mouth daily.   Yes Historical Provider, MD  HYDROcodone-acetaminophen (NORCO/VICODIN) 5-325 MG per tablet Take 1 tablet by mouth 2 (two) times daily. For chronic back pain,   Yes Historical  Provider, MD  lurasidone (LATUDA) 80 MG TABS tablet Take 80 mg by mouth daily with breakfast.   Yes Historical Provider, MD  trazodone (DESYREL) 300 MG tablet Take 300 mg by mouth at bedtime.   Yes Historical Provider, MD  valACYclovir (VALTREX) 1000 MG tablet Take 1,000 mg by mouth daily.   Yes Historical Provider, MD   BP 136/99  Pulse 110  Temp(Src) 98.1 F (36.7 C) (Oral)  Resp 20  Ht 6' 3"  (1.905 m)  Wt 250  lb (113.399 kg)  BMI 31.25 kg/m2  SpO2 97% Filed Vitals:   04/21/14 1306 04/21/14 1619 04/21/14 1619 04/21/14 1620  BP: 155/77 146/81 153/96 136/99  Pulse: 128 102 95 110  Temp: 98.1 F (36.7 C)     TempSrc: Oral     Resp: 20     Height: 6' 3"  (1.905 m)     Weight: 250 lb (113.399 kg)     SpO2: 97%       Physical Exam 1515: Physical examination:  Nursing notes reviewed; Vital signs and O2 SAT reviewed;  Constitutional: Well developed, Well nourished, Well hydrated, In no acute distress; Head:  Normocephalic, atraumatic; Eyes: EOMI, PERRL, No scleral icterus; ENMT: TM's clear bilat. Mouth and pharynx normal, Mucous membranes moist. Mouth and pharynx without lesions. No tonsillar exudates. No intra-oral edema. No submandibular or sublingual edema. No hoarse voice, no drooling, no stridor. No pain with manipulation of larynx. No trismus.; Neck: Supple, Full range of motion, No lymphadenopathy; Cardiovascular: Tachycardic rate and rhythm, No murmur, rub, or gallop; Respiratory: Breath sounds clear & equal bilaterally, No rales, rhonchi, wheezes.  Speaking full sentences with ease, Normal respiratory effort/excursion; Chest: Nontender, Movement normal; Abdomen: Soft, Nontender, Nondistended, Normal bowel sounds; Genitourinary: No CVA tenderness; Extremities: Pulses normal, No tenderness, No edema, No calf edema or asymmetry.; Neuro: AA&Ox3, Major CN grossly intact.  Speech clear. No gross focal motor or sensory deficits in extremities. Climbs on and off stretcher easily by himself. Gait steady.; Skin: Color normal, Warm, Dry.; Psych:  Anxious.    ED Course  Procedures     MDM  MDM Reviewed: previous chart, nursing note and vitals Interpretation: labs    , Results for orders placed during the hospital encounter of 04/21/14  I-STAT CHEM 8, ED      Result Value Ref Range   Sodium 141  137 - 147 mEq/L   Potassium 3.4 (*) 3.7 - 5.3 mEq/L   Chloride 100  96 - 112 mEq/L   BUN 11  6 - 23  mg/dL   Creatinine, Ser 1.50 (*) 0.50 - 1.35 mg/dL   Glucose, Bld 105 (*) 70 - 99 mg/dL   Calcium, Ion 1.17  1.12 - 1.23 mmol/L   TCO2 21  0 - 100 mmol/L   Hemoglobin 17.0  13.0 - 17.0 g/dL   HCT 50.0  39.0 - 52.0 %    1625:  Pt anxious on arrival to ED. States he "feels better now" and wants to go home. VS are per baseline. Pt offered IVF but refuses, stating "my ride is here and I have to go now." Pt has to PO fluids well while in the ED; encouraged to do so after d/c. Dx and testing d/w pt.  Questions answered.  Verb understanding, agreeable to d/c home with outpt f/u.     Alfonzo Feller, DO 04/24/14 1445

## 2014-04-21 NOTE — ED Notes (Signed)
Pt able to tolerate fluids without difficulties.

## 2014-04-23 ENCOUNTER — Encounter (HOSPITAL_COMMUNITY): Payer: Self-pay | Admitting: Psychiatry

## 2014-04-23 ENCOUNTER — Ambulatory Visit (INDEPENDENT_AMBULATORY_CARE_PROVIDER_SITE_OTHER): Payer: Medicare HMO | Admitting: Psychiatry

## 2014-04-23 VITALS — BP 120/80 | Ht 75.0 in | Wt 265.0 lb

## 2014-04-23 DIAGNOSIS — F333 Major depressive disorder, recurrent, severe with psychotic symptoms: Secondary | ICD-10-CM

## 2014-04-23 MED ORDER — LAMOTRIGINE 100 MG PO TABS: 100.0000 mg | ORAL_TABLET | Freq: Two times a day (BID) | ORAL | Status: DC

## 2014-04-23 MED ORDER — MIRTAZAPINE 30 MG PO TABS: 30.0000 mg | ORAL_TABLET | Freq: Every day | ORAL | Status: DC

## 2014-04-23 MED ORDER — LURASIDONE HCL 120 MG PO TABS: 120.0000 mg | ORAL_TABLET | Freq: Every day | ORAL | Status: DC
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
## 2014-05-22 ENCOUNTER — Ambulatory Visit (HOSPITAL_COMMUNITY): Payer: Self-pay | Admitting: Psychiatry

## 2014-06-03 ENCOUNTER — Telehealth (HOSPITAL_COMMUNITY): Payer: Self-pay | Admitting: *Deleted

## 2014-06-03 NOTE — Telephone Encounter (Signed)
[name] is calling to get a copy of pt last assesment with Dr. Harrington Challenger. Per Pt release information on file, pt stated he only wanted Dr. Harrington Challenger to talk to them and did not specify if we need to send any copies of records.

## 2014-06-03 NOTE — Telephone Encounter (Signed)
We will have to ask patient at his next visit if he wants to release the records

## 2014-06-04 ENCOUNTER — Ambulatory Visit (INDEPENDENT_AMBULATORY_CARE_PROVIDER_SITE_OTHER): Payer: Medicare HMO | Admitting: Psychiatry

## 2014-06-04 ENCOUNTER — Encounter (HOSPITAL_COMMUNITY): Payer: Self-pay | Admitting: Psychiatry

## 2014-06-04 ENCOUNTER — Ambulatory Visit (HOSPITAL_COMMUNITY): Payer: Self-pay | Admitting: Psychiatry

## 2014-06-04 DIAGNOSIS — F1010 Alcohol abuse, uncomplicated: Secondary | ICD-10-CM

## 2014-06-04 DIAGNOSIS — F333 Major depressive disorder, recurrent, severe with psychotic symptoms: Secondary | ICD-10-CM

## 2014-06-04 DIAGNOSIS — F1994 Other psychoactive substance use, unspecified with psychoactive substance-induced mood disorder: Secondary | ICD-10-CM

## 2014-06-04 DIAGNOSIS — F4310 Post-traumatic stress disorder, unspecified: Secondary | ICD-10-CM

## 2014-06-04 MED ORDER — LURASIDONE HCL 120 MG PO TABS: 120.0000 mg | ORAL_TABLET | Freq: Every day | ORAL | Status: DC

## 2014-06-04 MED ORDER — LAMOTRIGINE 100 MG PO TABS: 100.0000 mg | ORAL_TABLET | Freq: Two times a day (BID) | ORAL | Status: DC

## 2014-06-04 MED ORDER — MIRTAZAPINE 30 MG PO TABS: 30.0000 mg | ORAL_TABLET | Freq: Every day | ORAL | Status: DC

## 2014-06-04 NOTE — Telephone Encounter (Signed)
Pt came in today and filled out release form and was token care of by Radene Journey
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
## 2014-07-16 ENCOUNTER — Ambulatory Visit (HOSPITAL_COMMUNITY): Payer: Self-pay | Admitting: Psychiatry

## 2014-07-25 ENCOUNTER — Other Ambulatory Visit: Payer: Self-pay

## 2014-08-07 ENCOUNTER — Ambulatory Visit (INDEPENDENT_AMBULATORY_CARE_PROVIDER_SITE_OTHER): Payer: Medicare HMO | Admitting: Psychiatry

## 2014-08-07 ENCOUNTER — Encounter (HOSPITAL_COMMUNITY): Payer: Self-pay | Admitting: Psychiatry

## 2014-08-07 VITALS — BP 128/81 | HR 113 | Ht 75.0 in | Wt 271.4 lb

## 2014-08-07 DIAGNOSIS — F1994 Other psychoactive substance use, unspecified with psychoactive substance-induced mood disorder: Secondary | ICD-10-CM

## 2014-08-07 DIAGNOSIS — F4310 Post-traumatic stress disorder, unspecified: Secondary | ICD-10-CM

## 2014-08-07 DIAGNOSIS — F333 Major depressive disorder, recurrent, severe with psychotic symptoms: Secondary | ICD-10-CM

## 2014-08-07 DIAGNOSIS — F1010 Alcohol abuse, uncomplicated: Secondary | ICD-10-CM

## 2014-08-07 MED ORDER — SUVOREXANT 20 MG PO TABS: 20.0000 mg | ORAL_TABLET | Freq: Every day | ORAL | Status: DC

## 2014-08-07 MED ORDER — LAMOTRIGINE 100 MG PO TABS: 100.0000 mg | ORAL_TABLET | Freq: Two times a day (BID) | ORAL | Status: DC

## 2014-08-07 MED ORDER — LURASIDONE HCL 120 MG PO TABS: 120.0000 mg | ORAL_TABLET | Freq: Every day | ORAL | Status: DC
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
## 2014-08-15 ENCOUNTER — Encounter (HOSPITAL_COMMUNITY): Payer: Self-pay | Admitting: *Deleted

## 2014-08-15 ENCOUNTER — Telehealth (HOSPITAL_COMMUNITY): Payer: Self-pay | Admitting: *Deleted

## 2014-08-15 NOTE — Telephone Encounter (Signed)
Pt called 08-14-14 stating he was out of his Belsomra samples and his pharmacy needed prior approval from his insurance before refilling his medication. Informed pt that I will work on the prior approval but he needed to come back to out office to redo his Urine test. Informed pt, first he needed to come back to do the urine test. pt then became very hostile with me. Pt stated that he do not understand why he have to do the test and that he was not going to come in to do the test. Explained to pt that his results came back and it stated that he tampered with the test. pt then increased his volume a bit more and stated he wanted me to remove him off of the schedule and that he will no longer be coming to our office. Pt then stated he is voluntarily discharging himself and it's his right and he did not want me to do any authorization. Pt then stated he did not want his Belsomra anymore if he have to come in to do the urine test and if he finds out that we've sent anything to his pharmacy he will sue our office and the company and file a grievance against the office. Pt then stated that he have been out of his belsomra for a week and he's not been sleeping. Informed pt that Dr. Harrington Challenger gived him 2 packs of Belsomra samples 08-07-14 and he should not be out it he is taking the way Dr. Harrington Challenger instructed him to take it. Pt then became very upset and stressed he wants to be off the schedule and to discharge him. Pt called later and asked for management number and stated he was going to report our office and he do not understand why he have to take the urine test and he wants our office to prior approve his belsomra and he will no longer come to our office and to discharge him then he called me a fucking bitch. After pt had stated that I stated ok sir I am going to hang up now and I hung up. Informed Dr. Harrington Challenger about this and Dr. Harrington Challenger stated to go ahead and get his med approve and if that what he wants to do then we can discharge  him. 08-15-14 called pt insurance and got his medication approved and that per our policy we will be sending him a discharge letter per his request. pt is aware and verbilize understanding.

## 2014-09-24 ENCOUNTER — Ambulatory Visit (HOSPITAL_COMMUNITY): Payer: Self-pay | Admitting: Psychiatry

## 2014-10-15 ENCOUNTER — Emergency Department (HOSPITAL_COMMUNITY)
Admission: EM | Admit: 2014-10-15 | Discharge: 2014-10-15 | Disposition: A | Discharge status: Home / Self Care | Payer: Medicare HMO | Attending: Emergency Medicine | Admitting: Emergency Medicine

## 2014-10-15 ENCOUNTER — Emergency Department (HOSPITAL_COMMUNITY): Payer: Medicare HMO

## 2014-10-15 ENCOUNTER — Encounter (HOSPITAL_COMMUNITY): Payer: Self-pay | Admitting: Emergency Medicine

## 2014-10-15 DIAGNOSIS — Z8619 Personal history of other infectious and parasitic diseases: Secondary | ICD-10-CM | POA: Insufficient documentation

## 2014-10-15 DIAGNOSIS — J069 Acute upper respiratory infection, unspecified: Secondary | ICD-10-CM

## 2014-10-15 DIAGNOSIS — F4310 Post-traumatic stress disorder, unspecified: Secondary | ICD-10-CM | POA: Diagnosis not present

## 2014-10-15 DIAGNOSIS — Z21 Asymptomatic human immunodeficiency virus [HIV] infection status: Secondary | ICD-10-CM | POA: Diagnosis not present

## 2014-10-15 DIAGNOSIS — R05 Cough: Secondary | ICD-10-CM

## 2014-10-15 DIAGNOSIS — Z720 Tobacco use: Secondary | ICD-10-CM | POA: Diagnosis not present

## 2014-10-15 DIAGNOSIS — F259 Schizoaffective disorder, unspecified: Secondary | ICD-10-CM | POA: Insufficient documentation

## 2014-10-15 DIAGNOSIS — J40 Bronchitis, not specified as acute or chronic: Secondary | ICD-10-CM

## 2014-10-15 DIAGNOSIS — F319 Bipolar disorder, unspecified: Secondary | ICD-10-CM | POA: Diagnosis not present

## 2014-10-15 DIAGNOSIS — Z79899 Other long term (current) drug therapy: Secondary | ICD-10-CM | POA: Diagnosis not present

## 2014-10-15 DIAGNOSIS — I10 Essential (primary) hypertension: Secondary | ICD-10-CM | POA: Diagnosis not present

## 2014-10-15 DIAGNOSIS — R059 Cough, unspecified: Secondary | ICD-10-CM

## 2014-10-15 MED ORDER — MINOCYCLINE HCL 100 MG PO CAPS: 100.0000 mg | ORAL_CAPSULE | Freq: Two times a day (BID) | ORAL | Status: DC

## 2014-10-15 MED ORDER — ALBUTEROL SULFATE HFA 108 (90 BASE) MCG/ACT IN AERS
2.0000 | INHALATION_SPRAY | Freq: Once | RESPIRATORY_TRACT | Status: AC
  Administered 2014-10-15: 2 via RESPIRATORY_TRACT
  Filled 2014-10-15: qty 6.7

## 2014-10-15 MED ORDER — HYDROCOD POLST-CHLORPHEN POLST 10-8 MG/5ML PO LQCR: 5.0000 mL | Freq: Two times a day (BID) | ORAL | Status: DC | PRN

## 2014-10-15 MED ORDER — IPRATROPIUM-ALBUTEROL 0.5-2.5 (3) MG/3ML IN SOLN
3.0000 mL | Freq: Once | RESPIRATORY_TRACT | Status: AC
  Administered 2014-10-15: 3 mL via RESPIRATORY_TRACT
  Filled 2014-10-15: qty 3

## 2014-10-15 MED ORDER — HYDROCOD POLST-CHLORPHEN POLST 10-8 MG/5ML PO LQCR
5.0000 mL | Freq: Once | ORAL | Status: AC
  Administered 2014-10-15: 5 mL via ORAL
  Filled 2014-10-15: qty 5

## 2014-10-15 NOTE — Discharge Instructions (Signed)
Your chest x-ray suggests bronchitis. Please wash hands frequently, please increase fluids. Please use albuterol 2 puffs every 4 hours, use minocycline 1 tablet 2 times daily with food. Use Tussionex every 12 hours for cough and congestion. This medication may cause drowsiness, please use with caution. Please see Dr.Avbuere next week for recheck. Upper Respiratory Infection, Adult An upper respiratory infection (URI) is also sometimes known as the common cold. The upper respiratory tract includes the nose, sinuses, throat, trachea, and bronchi. Bronchi are the airways leading to the lungs. Most people improve within 1 week, but symptoms can last up to 2 weeks. A residual cough may last even longer.  CAUSES Many different viruses can infect the tissues lining the upper respiratory tract. The tissues become irritated and inflamed and often become very moist. Mucus production is also common. A cold is contagious. You can easily spread the virus to others by oral contact. This includes kissing, sharing a glass, coughing, or sneezing. Touching your mouth or nose and then touching a surface, which is then touched by another person, can also spread the virus. SYMPTOMS  Symptoms typically develop 1 to 3 days after you come in contact with a cold virus. Symptoms vary from person to person. They may include:  Runny nose.  Sneezing.  Nasal congestion.  Sinus irritation.  Sore throat.  Loss of voice (laryngitis).  Cough.  Fatigue.  Muscle aches.  Loss of appetite.  Headache.  Low-grade fever. DIAGNOSIS  You might diagnose your own cold based on familiar symptoms, since most people get a cold 2 to 3 times a year. Your caregiver can confirm this based on your exam. Most importantly, your caregiver can check that your symptoms are not due to another disease such as strep throat, sinusitis, pneumonia, asthma, or epiglottitis. Blood tests, throat tests, and X-rays are not necessary to diagnose a  common cold, but they may sometimes be helpful in excluding other more serious diseases. Your caregiver will decide if any further tests are required. RISKS AND COMPLICATIONS  You may be at risk for a more severe case of the common cold if you smoke cigarettes, have chronic heart disease (such as heart failure) or lung disease (such as asthma), or if you have a weakened immune system. The very young and very old are also at risk for more serious infections. Bacterial sinusitis, middle ear infections, and bacterial pneumonia can complicate the common cold. The common cold can worsen asthma and chronic obstructive pulmonary disease (COPD). Sometimes, these complications can require emergency medical care and may be life-threatening. PREVENTION  The best way to protect against getting a cold is to practice good hygiene. Avoid oral or hand contact with people with cold symptoms. Wash your hands often if contact occurs. There is no clear evidence that vitamin C, vitamin E, echinacea, or exercise reduces the chance of developing a cold. However, it is always recommended to get plenty of rest and practice good nutrition. TREATMENT  Treatment is directed at relieving symptoms. There is no cure. Antibiotics are not effective, because the infection is caused by a virus, not by bacteria. Treatment may include:  Increased fluid intake. Sports drinks offer valuable electrolytes, sugars, and fluids.  Breathing heated mist or steam (vaporizer or shower).  Eating chicken soup or other clear broths, and maintaining good nutrition.  Getting plenty of rest.  Using gargles or lozenges for comfort.  Controlling fevers with ibuprofen or acetaminophen as directed by your caregiver.  Increasing usage of your inhaler if  you have asthma. Zinc gel and zinc lozenges, taken in the first 24 hours of the common cold, can shorten the duration and lessen the severity of symptoms. Pain medicines may help with fever, muscle  aches, and throat pain. A variety of non-prescription medicines are available to treat congestion and runny nose. Your caregiver can make recommendations and may suggest nasal or lung inhalers for other symptoms.  HOME CARE INSTRUCTIONS   Only take over-the-counter or prescription medicines for pain, discomfort, or fever as directed by your caregiver.  Use a warm mist humidifier or inhale steam from a shower to increase air moisture. This may keep secretions moist and make it easier to breathe.  Drink enough water and fluids to keep your urine clear or pale yellow.  Rest as needed.  Return to work when your temperature has returned to normal or as your caregiver advises. You may need to stay home longer to avoid infecting others. You can also use a face mask and careful hand washing to prevent spread of the virus. SEEK MEDICAL CARE IF:   After the first few days, you feel you are getting worse rather than better.  You need your caregiver's advice about medicines to control symptoms.  You develop chills, worsening shortness of breath, or brown or red sputum. These may be signs of pneumonia.  You develop yellow or brown nasal discharge or pain in the face, especially when you bend forward. These may be signs of sinusitis.  You develop a fever, swollen neck glands, pain with swallowing, or white areas in the back of your throat. These may be signs of strep throat. SEEK IMMEDIATE MEDICAL CARE IF:   You have a fever.  You develop severe or persistent headache, ear pain, sinus pain, or chest pain.  You develop wheezing, a prolonged cough, cough up blood, or have a change in your usual mucus (if you have chronic lung disease).  You develop sore muscles or a stiff neck. Document Released: 03/22/2001 Document Revised: 12/19/2011 Document Reviewed: 01/01/2014 [facility] Patient Information 2015 [place], [place]. This information is not intended to replace advice given to you by your health care  provider. Make sure you discuss any questions you have with your health care provider.

## 2014-10-15 NOTE — ED Provider Notes (Signed)
CSN: [id number]     Arrival date & time 10/15/14  1444 History   None    Chief Complaint  Patient presents with  . Cough     (Consider location/radiation/quality/duration/timing/severity/associated sxs/prior Treatment) HPI Comments: Patient is a 29 year old male who presents to the emergency department with a complaint of cough and congestion. The patient states that he this is the fifth day that he has been having excessive coughing, with some thick yellowish to brown sputum. He's had congestion in his nasal passages, post nasal drip, body aches, and generally not feeling well. He denies any active rash. He feels that he has had some temperature elevations, but is not sure. He has tried some over-the-counter medications but these have not been effective.  The history is provided by the patient.    Past Medical History  Diagnosis Date  . Hypertension   . HIV disease   . PTSD (post-traumatic stress disorder)   . Bipolar 1 disorder   . Schizoaffective disorder   . Depression   . Seizures   . Herpes genitalia    Past Surgical History  Procedure Laterality Date  . Hand surgery    . Back surgery     Family History  Problem Relation Age of Onset  . Alcohol abuse Mother   . Schizophrenia Father   . Depression Father   . Alcohol abuse Father   . Alcohol abuse Paternal Uncle   . Alcohol abuse Paternal Uncle    History  Substance Use Topics  . Smoking status: Current Every Day Smoker -- 1.00 packs/day    Types: Cigarettes  . Smokeless tobacco: Never Used  . 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       All ROS Neg except as noted in HPI  HENT: Positive for congestion and postnasal drip. Negative for nosebleeds.   Eyes: Negative for photophobia and discharge.  Respiratory: Positive for cough. Negative for shortness of  breath and wheezing.   Cardiovascular: Negative for chest pain and palpitations.  Gastrointestinal: Negative for abdominal pain and blood in stool.  Genitourinary: Negative for dysuria, frequency and hematuria.  Musculoskeletal: Negative for back pain, arthralgias and neck pain.  Skin: Negative for rash.  Neurological: Negative for dizziness, seizures and speech difficulty.  Psychiatric/Behavioral: Negative for hallucinations and confusion.      Allergies  Review of patient's allergies indicates no known allergies.  Home Medications   Prior to Admission medications   Medication Sig Start Date End Date Taking? Authorizing Provider  acetaminophen-codeine (TYLENOL #3) 300-30 MG per tablet Take 1 tablet by mouth every 4 (four) hours as needed for moderate pain (for chronic back and leg pain,).   Yes Historical Provider, MD  albuterol (PROVENTIL HFA;VENTOLIN HFA) 108 (90 BASE) MCG/ACT inhaler Inhale 2 puffs into the lungs every 6 (six) hours as needed for wheezing or shortness of breath.   Yes Historical Provider, MD  cyclobenzaprine (FLEXERIL) 10 MG tablet Take 10 mg by mouth 3 (three) times daily as needed for muscle spasms.   Yes Historical Provider, MD  DiphenhydrAMINE HCl (BENADRYL PO) Take 50 mg by mouth 2 (two) times daily.   Yes Historical Provider, MD  Emtricitab-Rilpivir-Tenofovir (COMPLERA) 200-25-300 MG TABS Take 1 tablet by mouth daily.   Yes Historical Provider, MD  HYDROcodone-acetaminophen (NORCO/VICODIN) 5-325 MG  per tablet Take 1 tablet by mouth 2 (two) times daily. For chronic back pain,   Yes Historical Provider, MD  lamoTRIgine (LAMICTAL) 100 MG tablet Take 1 tablet (100 mg total) by mouth 2 (two) times daily. 08/07/14  Yes Levonne Spiller, MD  Lurasidone HCl (LATUDA) 120 MG TABS Take 1 tablet (120 mg total) by mouth daily. 08/07/14  Yes Levonne Spiller, MD  rizatriptan (MAXALT-MLT) 10 MG disintegrating tablet Take 10 mg by mouth as needed.  02/21/14  Yes Historical Provider, MD   Suvorexant (BELSOMRA) 20 MG TABS Take 20 mg by mouth at bedtime. 08/07/14  Yes Levonne Spiller, MD  valACYclovir (VALTREX) 1000 MG tablet Take 1,000 mg by mouth daily.   Yes Historical Provider, MD  mirtazapine (REMERON) 30 MG tablet Take 1 tablet (30 mg total) by mouth at bedtime. 06/04/14   Levonne Spiller, MD   BP 137/83 mmHg  Pulse 104  Resp 18  Ht 6' 3"  (1.905 m)  Wt 260 lb (117.935 kg)  BMI 32.50 kg/m2  SpO2 99% Physical Exam  Constitutional: He is oriented to person, place, and time. He appears well-developed and well-nourished.  Non-toxic appearance.  HENT:  Head: Normocephalic.  Right Ear: Tympanic membrane and external ear normal.  Left Ear: Tympanic membrane and external ear normal.  Nasal congestion  Eyes: EOM and lids are normal. Pupils are equal, round, and reactive to light.  Neck: Normal range of motion. Neck supple. Carotid bruit is not present.  Cardiovascular: Normal rate, regular rhythm, normal heart sounds, intact distal pulses and normal pulses.   Pulmonary/Chest: No respiratory distress.  Patient has soft wheezes and a few scattered rhonchi present. Patient speaks in complete sentences without problem.  Abdominal: Soft. Bowel sounds are normal. There is no tenderness. There is no guarding.  Musculoskeletal: Normal range of motion.  Lymphadenopathy:       Head (right side): No submandibular adenopathy present.       Head (left side): No submandibular adenopathy present.    He has no cervical adenopathy.  Neurological: He is alert and oriented to person, place, and time. He has normal strength. No cranial nerve deficit or sensory deficit.  Skin: Skin is warm and dry. No rash noted.  Psychiatric: He has a normal mood and affect. His speech is normal.  Nursing note and vitals reviewed.   ED Course  Procedures (including critical care time) Labs Review Labs Reviewed - No data to display  Imaging Review No results found.   EKG Interpretation None       MDM  Heart rate is 104, otherwise the vital signs within normal limits. Pulse oximetry is 93% which is low by my interpretation.  Chest x-ray shows no acute infiltrate or pleural effusion. Patient has a history of asthma and bronchitis. He is also a smoker.     Plan at this time is for the patient to be treated with doxycycline, albuterol inhaler, and Tussionex. Patient is to follow with his primary physician for additional evaluation and management.    Final diagnoses:  Cough    *I have reviewed nursing notes, vital signs, and all appropriate lab and imaging results for this patient.    Lenox Ahr, PA-C 10/15/14 [facility], MD 10/16/14 [phone]

## 2014-10-15 NOTE — ED Notes (Signed)
Pt reports productive cough with thick yellow/brown sputum. Onset of symptoms 5 days ago.

## 2014-11-25 ENCOUNTER — Emergency Department (HOSPITAL_COMMUNITY)
Admission: EM | Admit: 2014-11-25 | Discharge: 2014-11-25 | Disposition: A | Discharge status: Home / Self Care | Payer: Medicare HMO | Attending: Emergency Medicine | Admitting: Emergency Medicine

## 2014-11-25 ENCOUNTER — Encounter (HOSPITAL_COMMUNITY): Payer: Self-pay

## 2014-11-25 DIAGNOSIS — R0789 Other chest pain: Secondary | ICD-10-CM

## 2014-11-25 DIAGNOSIS — Z720 Tobacco use: Secondary | ICD-10-CM | POA: Insufficient documentation

## 2014-11-25 DIAGNOSIS — Z21 Asymptomatic human immunodeficiency virus [HIV] infection status: Secondary | ICD-10-CM | POA: Diagnosis not present

## 2014-11-25 DIAGNOSIS — I10 Essential (primary) hypertension: Secondary | ICD-10-CM | POA: Diagnosis not present

## 2014-11-25 DIAGNOSIS — Z79899 Other long term (current) drug therapy: Secondary | ICD-10-CM | POA: Insufficient documentation

## 2014-11-25 DIAGNOSIS — Z8619 Personal history of other infectious and parasitic diseases: Secondary | ICD-10-CM | POA: Diagnosis not present

## 2014-11-25 DIAGNOSIS — R079 Chest pain, unspecified: Secondary | ICD-10-CM | POA: Diagnosis present

## 2014-11-25 DIAGNOSIS — F319 Bipolar disorder, unspecified: Secondary | ICD-10-CM | POA: Diagnosis not present

## 2014-11-25 DIAGNOSIS — F259 Schizoaffective disorder, unspecified: Secondary | ICD-10-CM | POA: Insufficient documentation

## 2014-11-25 LAB — BASIC METABOLIC PANEL
Anion gap: 5 (ref 5–15)
BUN: 15 mg/dL (ref 6–23)
CO2: 28 mmol/L (ref 19–32)
Calcium: 8.6 mg/dL (ref 8.4–10.5)
Chloride: 104 mmol/L (ref 96–112)
Creatinine, Ser: 1.46 mg/dL — ABNORMAL HIGH (ref 0.50–1.35)
GFR calc Af Amer: 73 mL/min — ABNORMAL LOW (ref >90)
GFR calc non Af Amer: 63 mL/min — ABNORMAL LOW (ref >90)
Glucose, Bld: 98 mg/dL (ref 70–99)
Potassium: 3.9 mmol/L (ref 3.5–5.1)
Sodium: 137 mmol/L (ref 135–145)

## 2014-11-25 LAB — CBC WITH DIFFERENTIAL/PLATELET
Basophils Absolute: 0 10*3/uL (ref 0.0–0.1)
Basophils Relative: 1 % (ref 0–1)
Eosinophils Absolute: 0.1 10*3/uL (ref 0.0–0.7)
Eosinophils Relative: 2 % (ref 0–5)
HCT: 43.2 % (ref 39.0–52.0)
Hemoglobin: 14.1 g/dL (ref 13.0–17.0)
Lymphocytes Relative: 34 % (ref 12–46)
Lymphs Abs: 2.2 10*3/uL (ref 0.7–4.0)
MCH: 32.3 pg (ref 26.0–34.0)
MCHC: 32.6 g/dL (ref 30.0–36.0)
MCV: 98.9 fL (ref 78.0–100.0)
Monocytes Absolute: 0.5 10*3/uL (ref 0.1–1.0)
Monocytes Relative: 9 % (ref 3–12)
Neutro Abs: 3.4 10*3/uL (ref 1.7–7.7)
Neutrophils Relative %: 54 % (ref 43–77)
Platelets: 207 10*3/uL (ref 150–400)
RBC: 4.37 MIL/uL (ref 4.22–5.81)
RDW: 13.1 % (ref 11.5–15.5)
WBC: 6.3 10*3/uL (ref 4.0–10.5)

## 2014-11-25 MED ORDER — SODIUM CHLORIDE 0.9 % IV SOLN
1000.0000 mL | INTRAVENOUS | Status: DC
  Administered 2014-11-25: 1000 mL via INTRAVENOUS

## 2014-11-25 MED ORDER — SODIUM CHLORIDE 0.9 % IV SOLN
1000.0000 mL | Freq: Once | INTRAVENOUS | Status: AC
  Administered 2014-11-25: 1000 mL via INTRAVENOUS

## 2014-11-25 NOTE — ED Provider Notes (Signed)
Pt with nl cbc and bmet except for slightly elevated creatine, but stable.   Pt feeling better with fluids.  He is instructed to follow up with his md if problems  Maudry Diego, MD 11/25/14 [phone]

## 2014-11-25 NOTE — ED Notes (Signed)
Patient states he has had chest pressure that feels like his chest is pounding for 2 days. Patient states "it only happens when i get dehydrated"

## 2014-11-25 NOTE — ED Provider Notes (Signed)
CSN: [id number]     Arrival date & time 11/25/14  0608 History   First MD Initiated Contact with Patient 11/25/14 [phone]     Chief Complaint  Patient presents with  . Chest Pain   HPI Patient presents to emergency room with complaints of palpitations and lightheadedness. Patient states the last 2 days he has been feeling lightheaded. He has had some intermittent episodes of palpitations where his heart feels like it is pounding. He denies any chest pain. He is not having any symptoms now. He denies vomiting but is having some diarrhea. He's had these symptoms before when he has been dehydrated. He is concerned the same thing may be occurring. This evening he woke up with similar symptoms decided come to the emergency room to be evaluated Past Medical History  Diagnosis Date  . Hypertension   . HIV disease   . PTSD (post-traumatic stress disorder)   . Bipolar 1 disorder   . Schizoaffective disorder   . Depression   . Seizures   . Herpes genitalia    Past Surgical History  Procedure Laterality Date  . Hand surgery    . Back surgery     Family History  Problem Relation Age of Onset  . Alcohol abuse Mother   . Schizophrenia Father   . Depression Father   . Alcohol abuse Father   . Alcohol abuse Paternal Uncle   . Alcohol abuse Paternal Uncle    History  Substance Use Topics  . Smoking status: Current Every Day Smoker -- 1.00 packs/day    Types: Cigarettes  . Smokeless tobacco: Never Used  . 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     Allergies  Review of patient's allergies indicates no known allergies.  Home Medications   Prior to Admission medications   Medication Sig Start Date End Date Taking? Authorizing Provider  Emtricitab-Rilpivir-Tenofovir (COMPLERA) 200-25-300 MG TABS Take 1 tablet by mouth at bedtime. Take with food   Yes Historical  Provider, MD  valACYclovir (VALTREX) 1000 MG tablet Take 1,000 mg by mouth daily.   Yes Historical Provider, MD  acetaminophen-codeine (TYLENOL #3) 300-30 MG per tablet Take 1 tablet by mouth every 4 (four) hours as needed for moderate pain (for chronic back and leg pain,).    Historical Provider, MD  albuterol (PROVENTIL HFA;VENTOLIN HFA) 108 (90 BASE) MCG/ACT inhaler Inhale 2 puffs into the lungs every 6 (six) hours as needed for wheezing or shortness of breath.    Historical Provider, MD  chlorpheniramine-HYDROcodone (TUSSIONEX PENNKINETIC ER) 10-8 MG/5ML LQCR Take 5 mLs by mouth every 12 (twelve) hours as needed for cough. 10/15/14   Lenox Ahr, PA-C  cyclobenzaprine (FLEXERIL) 10 MG tablet Take 10 mg by mouth 3 (three) times daily as needed for muscle spasms.    Historical Provider, MD  DM-Phenylephrine-Acetaminophen (TYLENOL COLD MULTI-SYMPTOM) 10-5-325 MG/15ML LIQD Take 10-15 mLs by mouth daily as needed (for cold symptoms).    Historical Provider, MD  lamoTRIgine (LAMICTAL) 100 MG tablet Take 1 tablet (100 mg total) by mouth 2 (two) times daily. Patient taking differently: Take 200 mg by mouth every evening.  08/07/14   Levonne Spiller, MD  Lurasidone HCl (LATUDA) 120 MG TABS Take 1 tablet (120 mg total) by mouth daily. 08/07/14   Levonne Spiller, MD  minocycline ([place]) 100  MG capsule Take 1 capsule (100 mg total) by mouth 2 (two) times daily. 10/15/14   Lenox Ahr, PA-C  mirtazapine (REMERON) 30 MG tablet Take 1 tablet (30 mg total) by mouth at bedtime. 06/04/14   Levonne Spiller, MD  Nicotine 21-14-7 MG/24HR KIT Place 1 patch onto the skin daily. 09/01/14   Historical Provider, MD  Phenylephrine-DM-GG-APAP (TYLENOL COLD MULTI-SYMPTOM) 5-10-200-325 MG TABS Take 1-2 tablets by mouth daily as needed (for cold and cough).    Historical Provider, MD  rizatriptan (MAXALT-MLT) 10 MG disintegrating tablet Take 10 mg by mouth as needed for migraine.  02/21/14   Historical Provider, MD   Suvorexant (BELSOMRA) 20 MG TABS Take 20 mg by mouth at bedtime. 08/07/14   Levonne Spiller, MD   BP 123/83 mmHg  Pulse 77  Temp(Src) 98.2 F (36.8 C) (Oral)  Resp 22  Ht 6' 3"  (1.905 m)  Wt 280 lb (127.007 kg)  BMI 35.00 kg/m2  SpO2 97% Physical Exam  Constitutional: He appears well-developed and well-nourished. No distress.  HENT:  Head: Normocephalic and atraumatic.  Right Ear: External ear normal.  Left Ear: External ear normal.  Eyes: Conjunctivae are normal. Right eye exhibits no discharge. Left eye exhibits no discharge. No scleral icterus.  Neck: Neck supple. No tracheal deviation present.  Cardiovascular: Normal rate, regular rhythm and intact distal pulses.   Pulmonary/Chest: Effort normal and breath sounds normal. No stridor. No respiratory distress. He has no wheezes. He has no rales.  Abdominal: Soft. Bowel sounds are normal. He exhibits no distension. There is no tenderness. There is no rebound and no guarding.  Musculoskeletal: He exhibits no edema or tenderness.  Neurological: He is alert. He has normal strength. No cranial nerve deficit (no facial droop, extraocular movements intact, no slurred speech) or sensory deficit. He exhibits normal muscle tone. He displays no seizure activity. Coordination normal.  Skin: Skin is warm and dry. No rash noted.  Psychiatric: He has a normal mood and affect.  Nursing note and vitals reviewed.   ED Course  Procedures (including critical care time) Labs Review Labs Reviewed  CBC WITH DIFFERENTIAL/PLATELET  BASIC METABOLIC PANEL     EKG Interpretation   Date/Time:  Tuesday November 25 2014 06:17:33 EST Ventricular Rate:  84 PR Interval:  144 QRS Duration: 94 QT Interval:  369 QTC Calculation: 436 R Axis:   66 Text Interpretation:  Sinus rhythm Baseline wander in lead(s) II III aVF  Since last tracing rate slower Confirmed by Jontez Redfield  MD-J, Susane Bey ([id number]) on  11/25/2014 6:57:22 AM      MDM   Pt appears well.  Normal  EKG.  May have a component of anxiety.  Will give liter of fluids, check CBC and BMET.  Dr.  Roderic Palau fill follow up on the labs.    Dorie Rank, MD 11/25/14 [phone]

## 2014-11-25 NOTE — Discharge Instructions (Signed)
Drink plenty of fluids. And follow up with your md if any problems

## 2014-12-05 ENCOUNTER — Encounter: Payer: Self-pay | Admitting: Psychiatry

## 2014-12-21 ENCOUNTER — Encounter (HOSPITAL_COMMUNITY): Payer: Self-pay | Admitting: Emergency Medicine

## 2014-12-21 ENCOUNTER — Emergency Department (HOSPITAL_COMMUNITY)
Admission: EM | Admit: 2014-12-21 | Discharge: 2014-12-21 | Disposition: A | Discharge status: Home / Self Care | Payer: Medicare HMO | Attending: Emergency Medicine | Admitting: Emergency Medicine

## 2014-12-21 DIAGNOSIS — Z8619 Personal history of other infectious and parasitic diseases: Secondary | ICD-10-CM | POA: Insufficient documentation

## 2014-12-21 DIAGNOSIS — Z792 Long term (current) use of antibiotics: Secondary | ICD-10-CM | POA: Insufficient documentation

## 2014-12-21 DIAGNOSIS — Z008 Encounter for other general examination: Secondary | ICD-10-CM | POA: Diagnosis present

## 2014-12-21 DIAGNOSIS — I10 Essential (primary) hypertension: Secondary | ICD-10-CM | POA: Diagnosis not present

## 2014-12-21 DIAGNOSIS — Z8669 Personal history of other diseases of the nervous system and sense organs: Secondary | ICD-10-CM | POA: Diagnosis not present

## 2014-12-21 DIAGNOSIS — F1099 Alcohol use, unspecified with unspecified alcohol-induced disorder: Secondary | ICD-10-CM

## 2014-12-21 DIAGNOSIS — Z79899 Other long term (current) drug therapy: Secondary | ICD-10-CM | POA: Diagnosis not present

## 2014-12-21 DIAGNOSIS — B20 Human immunodeficiency virus [HIV] disease: Secondary | ICD-10-CM | POA: Diagnosis not present

## 2014-12-21 DIAGNOSIS — Z720 Tobacco use: Secondary | ICD-10-CM | POA: Insufficient documentation

## 2014-12-21 DIAGNOSIS — F329 Major depressive disorder, single episode, unspecified: Secondary | ICD-10-CM | POA: Insufficient documentation

## 2014-12-21 DIAGNOSIS — R000 Tachycardia, unspecified: Secondary | ICD-10-CM | POA: Insufficient documentation

## 2014-12-21 DIAGNOSIS — F32A Depression, unspecified: Secondary | ICD-10-CM

## 2014-12-21 LAB — BASIC METABOLIC PANEL
Anion gap: 11 (ref 5–15)
BUN: 14 mg/dL (ref 6–23)
CO2: 21 mmol/L (ref 19–32)
Calcium: 8.4 mg/dL (ref 8.4–10.5)
Chloride: 109 mmol/L (ref 96–112)
Creatinine, Ser: 1.7 mg/dL — ABNORMAL HIGH (ref 0.50–1.35)
GFR calc Af Amer: 60 mL/min — ABNORMAL LOW (ref >90)
GFR calc non Af Amer: 52 mL/min — ABNORMAL LOW (ref >90)
Glucose, Bld: 99 mg/dL (ref 70–99)
Potassium: 3.8 mmol/L (ref 3.5–5.1)
Sodium: 141 mmol/L (ref 135–145)

## 2014-12-21 LAB — RAPID URINE DRUG SCREEN, HOSP PERFORMED
Amphetamines: NOT DETECTED
Barbiturates: NOT DETECTED
Benzodiazepines: NOT DETECTED
Cocaine: NOT DETECTED
Opiates: NOT DETECTED
Tetrahydrocannabinol: NOT DETECTED

## 2014-12-21 LAB — CBC WITH DIFFERENTIAL/PLATELET
Basophils Absolute: 0 10*3/uL (ref 0.0–0.1)
Basophils Relative: 1 % (ref 0–1)
Eosinophils Absolute: 0.1 10*3/uL (ref 0.0–0.7)
Eosinophils Relative: 1 % (ref 0–5)
HCT: 46.7 % (ref 39.0–52.0)
Hemoglobin: 15.7 g/dL (ref 13.0–17.0)
Lymphocytes Relative: 41 % (ref 12–46)
Lymphs Abs: 3.6 10*3/uL (ref 0.7–4.0)
MCH: 33 pg (ref 26.0–34.0)
MCHC: 33.6 g/dL (ref 30.0–36.0)
MCV: 98.1 fL (ref 78.0–100.0)
Monocytes Absolute: 0.5 10*3/uL (ref 0.1–1.0)
Monocytes Relative: 5 % (ref 3–12)
Neutro Abs: 4.7 10*3/uL (ref 1.7–7.7)
Neutrophils Relative %: 52 % (ref 43–77)
Platelets: 257 10*3/uL (ref 150–400)
RBC: 4.76 MIL/uL (ref 4.22–5.81)
RDW: 13.5 % (ref 11.5–15.5)
WBC: 8.9 10*3/uL (ref 4.0–10.5)

## 2014-12-21 LAB — COMPREHENSIVE METABOLIC PANEL
ALT: 25 U/L (ref 0–53)
AST: 28 U/L (ref 0–37)
Albumin: 4 g/dL (ref 3.5–5.2)
Alkaline Phosphatase: 76 U/L (ref 39–117)
Anion gap: 14 (ref 5–15)
BUN: 14 mg/dL (ref 6–23)
CO2: 19 mmol/L (ref 19–32)
Calcium: 9.6 mg/dL (ref 8.4–10.5)
Chloride: 106 mmol/L (ref 96–112)
Creatinine, Ser: 1.71 mg/dL — ABNORMAL HIGH (ref 0.50–1.35)
GFR calc Af Amer: 60 mL/min — ABNORMAL LOW (ref >90)
GFR calc non Af Amer: 52 mL/min — ABNORMAL LOW (ref >90)
Glucose, Bld: 99 mg/dL (ref 70–99)
Potassium: 3.7 mmol/L (ref 3.5–5.1)
Sodium: 139 mmol/L (ref 135–145)
Total Bilirubin: 0.4 mg/dL (ref 0.3–1.2)
Total Protein: 7.9 g/dL (ref 6.0–8.3)

## 2014-12-21 LAB — URINALYSIS, ROUTINE W REFLEX MICROSCOPIC
Bilirubin Urine: NEGATIVE
Glucose, UA: NEGATIVE mg/dL
Hgb urine dipstick: NEGATIVE
Leukocytes, UA: NEGATIVE
Nitrite: NEGATIVE
Protein, ur: NEGATIVE mg/dL
Specific Gravity, Urine: >1.03 — ABNORMAL HIGH (ref 1.005–1.030)
Urobilinogen, UA: 0.2 mg/dL (ref 0.0–1.0)
pH: 5.5 (ref 5.0–8.0)

## 2014-12-21 LAB — CK: Total CK: 470 U/L — ABNORMAL HIGH (ref 7–232)

## 2014-12-21 LAB — ETHANOL: Alcohol, Ethyl (B): 250 mg/dL — ABNORMAL HIGH (ref 0–9)

## 2014-12-21 MED ORDER — MIRTAZAPINE 30 MG PO TABS
30.0000 mg | ORAL_TABLET | Freq: Every day | ORAL | Status: DC
  Filled 2014-12-21: qty 1

## 2014-12-21 MED ORDER — LURASIDONE HCL 40 MG PO TABS
120.0000 mg | ORAL_TABLET | Freq: Every day | ORAL | Status: DC
  Administered 2014-12-21: 120 mg via ORAL
  Filled 2014-12-21 (×2): qty 3

## 2014-12-21 MED ORDER — MINOCYCLINE HCL 100 MG PO CAPS: 100.0000 mg | ORAL_CAPSULE | Freq: Two times a day (BID) | ORAL | Status: DC

## 2014-12-21 MED ORDER — ZIPRASIDONE MESYLATE 20 MG IM SOLR
10.0000 mg | Freq: Once | INTRAMUSCULAR | Status: AC
  Administered 2014-12-21: 10 mg via INTRAMUSCULAR

## 2014-12-21 MED ORDER — SODIUM CHLORIDE 0.9 % IV BOLUS (SEPSIS)
1000.0000 mL | Freq: Once | INTRAVENOUS | Status: AC
  Administered 2014-12-21: 1000 mL via INTRAVENOUS

## 2014-12-21 MED ORDER — VALACYCLOVIR HCL 500 MG PO TABS
1000.0000 mg | ORAL_TABLET | Freq: Every day | ORAL | Status: DC
  Filled 2014-12-21: qty 2

## 2014-12-21 MED ORDER — LAMOTRIGINE 100 MG PO TABS
100.0000 mg | ORAL_TABLET | Freq: Two times a day (BID) | ORAL | Status: DC
  Administered 2014-12-21: 100 mg via ORAL
  Filled 2014-12-21 (×3): qty 1

## 2014-12-21 MED ORDER — LACTULOSE 10 GM/15ML PO SOLN: 30.0000 g | Freq: Two times a day (BID) | ORAL | Status: DC | PRN

## 2014-12-21 MED ORDER — ZIPRASIDONE MESYLATE 20 MG IM SOLR
INTRAMUSCULAR | Status: AC
Start: 2014-12-21 — End: 2014-12-21
  Filled 2014-12-21: qty 20

## 2014-12-21 MED ORDER — SODIUM CHLORIDE 0.9 % IV BOLUS (SEPSIS): 1000.0000 mL | Freq: Once | INTRAVENOUS | Status: DC

## 2014-12-21 MED ORDER — ALBUTEROL SULFATE HFA 108 (90 BASE) MCG/ACT IN AERS: 2.0000 | INHALATION_SPRAY | Freq: Four times a day (QID) | RESPIRATORY_TRACT | Status: DC | PRN

## 2014-12-21 MED ORDER — STERILE WATER FOR INJECTION IJ SOLN
INTRAMUSCULAR | Status: AC
  Filled 2014-12-21: qty 10

## 2014-12-21 MED ORDER — EMTRICITAB-RILPIVIR-TENOFOV DF 200-25-300 MG PO TABS
1.0000 | ORAL_TABLET | Freq: Every day | ORAL | Status: DC
  Filled 2014-12-21: qty 1

## 2014-12-21 MED ORDER — CYCLOBENZAPRINE HCL 10 MG PO TABS
10.0000 mg | ORAL_TABLET | Freq: Three times a day (TID) | ORAL | Status: DC | PRN
  Administered 2014-12-21: 10 mg via ORAL
  Filled 2014-12-21: qty 1

## 2014-12-21 MED ORDER — NICOTINE 21 MG/24HR TD PT24
21.0000 mg | MEDICATED_PATCH | Freq: Every day | TRANSDERMAL | Status: DC
  Administered 2014-12-21: 21 mg via TRANSDERMAL
  Filled 2014-12-21: qty 1

## 2014-12-21 MED ORDER — SODIUM CHLORIDE 0.9 % IV SOLN
INTRAVENOUS | Status: DC
  Administered 2014-12-21: 05:00:00 via INTRAVENOUS

## 2014-12-21 MED ORDER — SUVOREXANT 20 MG PO TABS: 20.0000 mg | ORAL_TABLET | Freq: Every day | ORAL | Status: DC

## 2014-12-21 NOTE — BH Assessment (Signed)
Clinician experiencing difficulty with connectivity with tele-assessment for about 15 mins.  Rigoberto Noel, MSW, LCSW Triage Specialist [phone],

## 2014-12-21 NOTE — ED Notes (Signed)
telepsych in process.

## 2014-12-21 NOTE — Discharge Instructions (Signed)
Follow-up with community mental health resources

## 2014-12-21 NOTE — ED Notes (Signed)
Pt awake. [place] aware and reported to get TTS consult order from EDP.

## 2014-12-21 NOTE — ED Notes (Signed)
MD at bedside. 

## 2014-12-21 NOTE — ED Notes (Signed)
Pt states "I need help". "There is KKK people walking around my house and threatening me. People are calling me and making threatening phone calls against me". "This is a voluntary commitment and I need help. I want help from alcohol and drinking." Pt reports drinking 3-4 40s today.  Pt is very erratic and upset in triage.

## 2014-12-21 NOTE — ED Provider Notes (Addendum)
CSN: [id number]     Arrival date & time 12/21/14  0148 History   First MD Initiated Contact with Patient 12/21/14 [phone]     Chief Complaint  Patient presents with  . V70.1     (Consider location/radiation/quality/duration/timing/severity/associated sxs/prior Treatment) HPI Comments: Patient here requesting help due to his paranoia. Admits to drinking copious amounts of alcohol this evening. Review of the old records show that the patient does have a history of schizophrenia and he states he is not been completely compliant with his medications. He denies any suicidal or homicidal ideations. Patient feels as if the KKK is coming after him. His symptoms have been gradually worsening over the evening. Nothing makes them better. No treatment used prior to arrival. No further history obtainable due to his current agitated state  The history is provided by the patient. The history is limited by the condition of the patient.    Past Medical History  Diagnosis Date  . Hypertension   . HIV disease   . PTSD (post-traumatic stress disorder)   . Bipolar 1 disorder   . Schizoaffective disorder   . Depression   . Seizures   . Herpes genitalia    Past Surgical History  Procedure Laterality Date  . Hand surgery    . Back surgery     Family History  Problem Relation Age of Onset  . Alcohol abuse Mother   . Schizophrenia Father   . Depression Father   . Alcohol abuse Father   . Alcohol abuse Paternal Uncle   . Alcohol abuse Paternal Uncle    History  Substance Use Topics  . Smoking status: Current Every Day Smoker -- 1.00 packs/day    Types: Cigarettes  . Smokeless tobacco: Never Used  . Alcohol Use: 1.8 oz/week    3 Cans of beer per week     Comment: 60 days out of rehab, clean    Review of Systems  Unable to perform ROS     Allergies  Review of patient's allergies indicates no known allergies.  Home Medications   Prior to Admission medications   Medication Sig Start Date  End Date Taking? Authorizing Provider  acetaminophen-codeine (TYLENOL #3) 300-30 MG per tablet Take 1 tablet by mouth every 4 (four) hours as needed for moderate pain (for chronic back and leg pain,).    Historical Provider, MD  albuterol (PROVENTIL HFA;VENTOLIN HFA) 108 (90 BASE) MCG/ACT inhaler Inhale 2 puffs into the lungs every 6 (six) hours as needed for wheezing or shortness of breath.    Historical Provider, MD  chlorpheniramine-HYDROcodone (TUSSIONEX PENNKINETIC ER) 10-8 MG/5ML LQCR Take 5 mLs by mouth every 12 (twelve) hours as needed for cough. 10/15/14   Lily Kocher, PA-C  cyclobenzaprine (FLEXERIL) 10 MG tablet Take 10 mg by mouth 3 (three) times daily as needed for muscle spasms.    Historical Provider, MD  DM-Phenylephrine-Acetaminophen (TYLENOL COLD MULTI-SYMPTOM) 10-5-325 MG/15ML LIQD Take 10-15 mLs by mouth daily as needed (for cold symptoms).    Historical Provider, MD  Emtricitab-Rilpivir-Tenofovir (COMPLERA) 200-25-300 MG TABS Take 1 tablet by mouth at bedtime. Take with food    Historical Provider, MD  lamoTRIgine (LAMICTAL) 100 MG tablet Take 1 tablet (100 mg total) by mouth 2 (two) times daily. Patient taking differently: Take 200 mg by mouth every evening.  08/07/14   Cloria Spring, MD  Lurasidone HCl (LATUDA) 120 MG TABS Take 1 tablet (120 mg total) by mouth daily. 08/07/14   Cloria Spring, MD  minocycline (MINOCIN,DYNACIN) 100 MG capsule Take 1 capsule (100 mg total) by mouth 2 (two) times daily. 10/15/14   Lily Kocher, PA-C  mirtazapine (REMERON) 30 MG tablet Take 1 tablet (30 mg total) by mouth at bedtime. 06/04/14   Cloria Spring, MD  Nicotine 21-14-7 MG/24HR KIT Place 1 patch onto the skin daily. 09/01/14   Historical Provider, MD  Phenylephrine-DM-GG-APAP (TYLENOL COLD MULTI-SYMPTOM) 5-10-200-325 MG TABS Take 1-2 tablets by mouth daily as needed (for cold and cough).    Historical Provider, MD  rizatriptan (MAXALT-MLT) 10 MG disintegrating tablet Take 10 mg by mouth as  needed for migraine.  02/21/14   Historical Provider, MD  Suvorexant (BELSOMRA) 20 MG TABS Take 20 mg by mouth at bedtime. 08/07/14   Cloria Spring, MD  valACYclovir (VALTREX) 1000 MG tablet Take 1,000 mg by mouth daily.    Historical Provider, MD   BP 130/83 mmHg  Pulse 127  Temp(Src) 98.3 F (36.8 C)  Resp 22  Ht _0  (1.905 m)  Wt 280 lb (127.007 kg)  BMI 35.00 kg/m2  SpO2 98% Physical Exam  Constitutional: He is oriented to person, place, and time. He appears well-developed and well-nourished.  Non-toxic appearance. No distress.  HENT:  Head: Normocephalic and atraumatic.  Eyes: Conjunctivae, EOM and lids are normal. Pupils are equal, round, and reactive to light.  Neck: Normal range of motion. Neck supple. No tracheal deviation present. No thyroid mass present.  Cardiovascular: Regular rhythm and normal heart sounds.  Tachycardia present.  Exam reveals no gallop.   No murmur heard. Pulmonary/Chest: Effort normal and breath sounds normal. No stridor. No respiratory distress. He has no decreased breath sounds. He has no wheezes. He has no rhonchi. He has no rales.  Abdominal: Soft. Normal appearance and bowel sounds are normal. He exhibits no distension. There is no tenderness. There is no rebound and no CVA tenderness.  Musculoskeletal: Normal range of motion. He exhibits no edema or tenderness.  Neurological: He is alert and oriented to person, place, and time. He has normal strength. No cranial nerve deficit or sensory deficit. GCS eye subscore is 4. GCS verbal subscore is 5. GCS motor subscore is 6.  Skin: Skin is warm and dry. No abrasion and no rash noted.  Psychiatric: His mood appears anxious. His speech is rapid and/or pressured. He is agitated and aggressive.  Nursing note and vitals reviewed.   ED Course  Procedures (including critical care time) Labs Review Labs Reviewed  ETHANOL  URINE RAPID DRUG SCREEN (HOSP PERFORMED)  CBC WITH DIFFERENTIAL/PLATELET   COMPREHENSIVE METABOLIC PANEL    Imaging Review No results found.   EKG Interpretation   Date/Time:  Sunday December 21 2014 06:24:18 EDT Ventricular Rate:  99 PR Interval:  134 QRS Duration: 86 QT Interval:  343 QTC Calculation: 440 R Axis:   75 Text Interpretation:  Sinus rhythm No significant change since last  tracing Confirmed by Sheniya Garciaperez  MD, Bayan Kushnir ([id number]) on 12/21/2014 6:27:23 AM      MDM   Final diagnoses:  None    Patient given Geodon 10 mg IM for agitation. Patient's creatinine noted and he'll be given 3 L of saline for suspected dehydration and we will recheck his bmet. Review of old chart shows that this is likely just worsening chronic renal insufficency. Patient rechecked and he is resting comfortably at this time. He is more calm. He does have evidence of alcohol intoxication. No evidence of rhabdomyolysis. Repeat heart rate has improved to  90. Will consult psychiatry once he is sober.Signed out to Dr. Lacinda Axon.   CRITICAL CARE Performed by: Leota Jacobsen Total critical care time: 50 Critical care time was exclusive of separately billable procedures and treating other patients. Critical care was necessary to treat or prevent imminent or life-threatening deterioration. Critical care was time spent personally by me on the following activities: development of treatment plan with patient and/or surrogate as well as nursing, discussions with consultants, evaluation of patient's response to treatment, examination of patient, obtaining history from patient or surrogate, ordering and performing treatments and interventions, ordering and review of laboratory studies, ordering and review of radiographic studies, pulse oximetry and re-evaluation of patient's condition.     Lacretia Leigh, MD 12/21/14 1642  Lacretia Leigh, MD 12/21/14 9037  Lacretia Leigh, MD 12/21/14 1415

## 2014-12-21 NOTE — ED Provider Notes (Signed)
No suicidal or homicidal ideation. Behavioral health consult obtained. They believe patient can be treated as an outpatient.  Nat Christen, MD 12/21/14 1247
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
## 2014-12-21 NOTE — ED Notes (Signed)
telepsych monitor at bedside.

## 2014-12-21 NOTE — ED Notes (Signed)
Pt yelling at staff and security that he is going to sue [place]. Pt upset because he is not allowed his phone in the room or his personal belongings. Explained to pt the nature of being a pysch patient here and the guidelines we follow. Pt repeatedly stating "I don't trust you. Where's the black nurses?"

## 2014-12-21 NOTE — BH Assessment (Signed)
Clinician consulted with Dr. Harrington Challenger and Waylan Boga, NP who states Pt does not meet criteria for inpatient admission. Clinician provided updates to Dr. Lacinda Axon and Theadora Rama, RN. Clinician to fax outpatient referrals for patient to follow up.   Rigoberto Noel, MSW, LCSW Clinical Social Worker

## 2015-05-12 ENCOUNTER — Encounter (HOSPITAL_COMMUNITY): Payer: Self-pay | Admitting: Emergency Medicine

## 2015-05-12 ENCOUNTER — Emergency Department (HOSPITAL_COMMUNITY)
Admission: EM | Admit: 2015-05-12 | Discharge: 2015-05-12 | Disposition: A | Discharge status: Other Acute Inpatient Hospital | Payer: Medicare HMO | Attending: Emergency Medicine | Admitting: Emergency Medicine

## 2015-05-12 ENCOUNTER — Inpatient Hospital Stay (HOSPITAL_COMMUNITY)
Admission: AD | Admit: 2015-05-12 | Discharge: 2015-05-15 | DRG: 885 | Disposition: A | Discharge status: Home / Self Care | Payer: Medicare HMO | Source: Intra-hospital | Attending: Psychiatry | Admitting: Psychiatry

## 2015-05-12 ENCOUNTER — Encounter (HOSPITAL_COMMUNITY): Payer: Self-pay

## 2015-05-12 DIAGNOSIS — F251 Schizoaffective disorder, depressive type: Secondary | ICD-10-CM | POA: Diagnosis present

## 2015-05-12 DIAGNOSIS — Z818 Family history of other mental and behavioral disorders: Secondary | ICD-10-CM

## 2015-05-12 DIAGNOSIS — B20 Human immunodeficiency virus [HIV] disease: Secondary | ICD-10-CM | POA: Diagnosis present

## 2015-05-12 DIAGNOSIS — Y999 Unspecified external cause status: Secondary | ICD-10-CM | POA: Insufficient documentation

## 2015-05-12 DIAGNOSIS — I10 Essential (primary) hypertension: Secondary | ICD-10-CM | POA: Diagnosis present

## 2015-05-12 DIAGNOSIS — F209 Schizophrenia, unspecified: Secondary | ICD-10-CM | POA: Diagnosis present

## 2015-05-12 DIAGNOSIS — Z9889 Other specified postprocedural states: Secondary | ICD-10-CM | POA: Diagnosis not present

## 2015-05-12 DIAGNOSIS — Y929 Unspecified place or not applicable: Secondary | ICD-10-CM | POA: Diagnosis not present

## 2015-05-12 DIAGNOSIS — F1020 Alcohol dependence, uncomplicated: Secondary | ICD-10-CM | POA: Diagnosis present

## 2015-05-12 DIAGNOSIS — Y939 Activity, unspecified: Secondary | ICD-10-CM | POA: Insufficient documentation

## 2015-05-12 DIAGNOSIS — F17210 Nicotine dependence, cigarettes, uncomplicated: Secondary | ICD-10-CM | POA: Diagnosis present

## 2015-05-12 DIAGNOSIS — Z79899 Other long term (current) drug therapy: Secondary | ICD-10-CM | POA: Diagnosis not present

## 2015-05-12 DIAGNOSIS — F329 Major depressive disorder, single episode, unspecified: Secondary | ICD-10-CM | POA: Diagnosis not present

## 2015-05-12 DIAGNOSIS — Z21 Asymptomatic human immunodeficiency virus [HIV] infection status: Secondary | ICD-10-CM | POA: Insufficient documentation

## 2015-05-12 DIAGNOSIS — F10920 Alcohol use, unspecified with intoxication, uncomplicated: Secondary | ICD-10-CM

## 2015-05-12 DIAGNOSIS — F419 Anxiety disorder, unspecified: Secondary | ICD-10-CM | POA: Diagnosis present

## 2015-05-12 DIAGNOSIS — R45851 Suicidal ideations: Secondary | ICD-10-CM

## 2015-05-12 DIAGNOSIS — Z720 Tobacco use: Secondary | ICD-10-CM | POA: Insufficient documentation

## 2015-05-12 DIAGNOSIS — G4700 Insomnia, unspecified: Secondary | ICD-10-CM | POA: Diagnosis present

## 2015-05-12 DIAGNOSIS — F32A Depression, unspecified: Secondary | ICD-10-CM

## 2015-05-12 DIAGNOSIS — G43909 Migraine, unspecified, not intractable, without status migrainosus: Secondary | ICD-10-CM | POA: Diagnosis not present

## 2015-05-12 DIAGNOSIS — F259 Schizoaffective disorder, unspecified: Secondary | ICD-10-CM | POA: Insufficient documentation

## 2015-05-12 DIAGNOSIS — Z915 Personal history of self-harm: Secondary | ICD-10-CM

## 2015-05-12 DIAGNOSIS — F323 Major depressive disorder, single episode, severe with psychotic features: Secondary | ICD-10-CM | POA: Diagnosis present

## 2015-05-12 DIAGNOSIS — F39 Unspecified mood [affective] disorder: Secondary | ICD-10-CM | POA: Insufficient documentation

## 2015-05-12 DIAGNOSIS — Z7951 Long term (current) use of inhaled steroids: Secondary | ICD-10-CM

## 2015-05-12 DIAGNOSIS — Z811 Family history of alcohol abuse and dependence: Secondary | ICD-10-CM | POA: Diagnosis not present

## 2015-05-12 DIAGNOSIS — T43202A Poisoning by unspecified antidepressants, intentional self-harm, initial encounter: Secondary | ICD-10-CM | POA: Insufficient documentation

## 2015-05-12 DIAGNOSIS — T50902A Poisoning by unspecified drugs, medicaments and biological substances, intentional self-harm, initial encounter: Secondary | ICD-10-CM

## 2015-05-12 DIAGNOSIS — F10120 Alcohol abuse with intoxication, uncomplicated: Secondary | ICD-10-CM | POA: Diagnosis not present

## 2015-05-12 DIAGNOSIS — Z8619 Personal history of other infectious and parasitic diseases: Secondary | ICD-10-CM | POA: Insufficient documentation

## 2015-05-12 LAB — CBC
HCT: 47.7 % (ref 39.0–52.0)
Hemoglobin: 15.9 g/dL (ref 13.0–17.0)
MCH: 32.7 pg (ref 26.0–34.0)
MCHC: 33.3 g/dL (ref 30.0–36.0)
MCV: 98.1 fL (ref 78.0–100.0)
Platelets: 221 10*3/uL (ref 150–400)
RBC: 4.86 MIL/uL (ref 4.22–5.81)
RDW: 13.2 % (ref 11.5–15.5)
WBC: 9.5 10*3/uL (ref 4.0–10.5)

## 2015-05-12 LAB — COMPREHENSIVE METABOLIC PANEL
ALT: 28 U/L (ref 17–63)
AST: 27 U/L (ref 15–41)
Albumin: 4.4 g/dL (ref 3.5–5.0)
Alkaline Phosphatase: 83 U/L (ref 38–126)
Anion gap: 11 (ref 5–15)
BUN: 14 mg/dL (ref 6–20)
CO2: 24 mmol/L (ref 22–32)
Calcium: 9.5 mg/dL (ref 8.9–10.3)
Chloride: 107 mmol/L (ref 101–111)
Creatinine, Ser: 1.58 mg/dL — ABNORMAL HIGH (ref 0.61–1.24)
GFR calc Af Amer: >60 mL/min (ref >60)
GFR calc non Af Amer: 57 mL/min — ABNORMAL LOW (ref >60)
Glucose, Bld: 115 mg/dL — ABNORMAL HIGH (ref 65–99)
Potassium: 3.7 mmol/L (ref 3.5–5.1)
Sodium: 142 mmol/L (ref 135–145)
Total Bilirubin: 0.4 mg/dL (ref 0.3–1.2)
Total Protein: 9 g/dL — ABNORMAL HIGH (ref 6.5–8.1)

## 2015-05-12 LAB — RAPID URINE DRUG SCREEN, HOSP PERFORMED
Amphetamines: NOT DETECTED
Barbiturates: NOT DETECTED
Benzodiazepines: NOT DETECTED
Cocaine: NOT DETECTED
Opiates: NOT DETECTED
Tetrahydrocannabinol: NOT DETECTED

## 2015-05-12 LAB — SALICYLATE LEVEL: Salicylate Lvl: <4 mg/dL (ref 2.8–30.0)

## 2015-05-12 LAB — ETHANOL: Alcohol, Ethyl (B): 300 mg/dL — ABNORMAL HIGH (ref <5)

## 2015-05-12 LAB — ACETAMINOPHEN LEVEL: Acetaminophen (Tylenol), Serum: <10 ug/mL — ABNORMAL LOW (ref 10–30)

## 2015-05-12 MED ORDER — VITAMIN B-1 100 MG PO TABS
100.0000 mg | ORAL_TABLET | Freq: Every day | ORAL | Status: DC
Start: 2015-05-12 — End: 2015-05-12
  Administered 2015-05-12: 100 mg via ORAL
  Filled 2015-05-12: qty 1

## 2015-05-12 MED ORDER — ALUM & MAG HYDROXIDE-SIMETH 200-200-20 MG/5ML PO SUSP: 30.0000 mL | ORAL | Status: DC | PRN

## 2015-05-12 MED ORDER — ONDANSETRON 4 MG PO TBDP: 4.0000 mg | ORAL_TABLET | Freq: Four times a day (QID) | ORAL | Status: DC | PRN

## 2015-05-12 MED ORDER — THIAMINE HCL 100 MG/ML IJ SOLN: 100.0000 mg | Freq: Every day | INTRAMUSCULAR | Status: DC

## 2015-05-12 MED ORDER — MAGNESIUM HYDROXIDE 400 MG/5ML PO SUSP
30.0000 mL | Freq: Every day | ORAL | Status: DC | PRN
  Administered 2015-05-14: 30 mL via ORAL
  Filled 2015-05-12: qty 30

## 2015-05-12 MED ORDER — IBUPROFEN 200 MG PO TABS
600.0000 mg | ORAL_TABLET | Freq: Three times a day (TID) | ORAL | Status: DC | PRN
  Administered 2015-05-12: 600 mg via ORAL
  Filled 2015-05-12: qty 3

## 2015-05-12 MED ORDER — EMTRICITAB-RILPIVIR-TENOFOV DF 200-25-300 MG PO TABS
1.0000 | ORAL_TABLET | Freq: Every day | ORAL | Status: DC
  Administered 2015-05-12 – 2015-05-14 (×3): 1 via ORAL
  Filled 2015-05-12 (×6): qty 1

## 2015-05-12 MED ORDER — HYDROXYZINE HCL 25 MG PO TABS: 25.0000 mg | ORAL_TABLET | Freq: Two times a day (BID) | ORAL | Status: DC | PRN

## 2015-05-12 MED ORDER — HYDROXYZINE HCL 25 MG PO TABS: 25.0000 mg | ORAL_TABLET | Freq: Four times a day (QID) | ORAL | Status: DC | PRN

## 2015-05-12 MED ORDER — ACETAMINOPHEN 325 MG PO TABS: 650.0000 mg | ORAL_TABLET | ORAL | Status: DC | PRN

## 2015-05-12 MED ORDER — ACETAMINOPHEN 325 MG PO TABS
650.0000 mg | ORAL_TABLET | Freq: Four times a day (QID) | ORAL | Status: DC | PRN
  Administered 2015-05-12 – 2015-05-13 (×3): 650 mg via ORAL
  Filled 2015-05-12 (×3): qty 2

## 2015-05-12 MED ORDER — SODIUM CHLORIDE 0.9 % IV BOLUS (SEPSIS)
500.0000 mL | Freq: Once | INTRAVENOUS | Status: AC
  Administered 2015-05-12: 500 mL via INTRAVENOUS

## 2015-05-12 MED ORDER — LORAZEPAM 1 MG PO TABS: 1.0000 mg | ORAL_TABLET | Freq: Two times a day (BID) | ORAL | Status: DC

## 2015-05-12 MED ORDER — LORAZEPAM 1 MG PO TABS
0.0000 mg | ORAL_TABLET | Freq: Four times a day (QID) | ORAL | Status: DC
  Administered 2015-05-12: 1 mg via ORAL
  Filled 2015-05-12: qty 1

## 2015-05-12 MED ORDER — ADULT MULTIVITAMIN W/MINERALS CH
1.0000 | ORAL_TABLET | Freq: Every day | ORAL | Status: DC
  Administered 2015-05-12 – 2015-05-15 (×4): 1 via ORAL
  Filled 2015-05-12 (×7): qty 1

## 2015-05-12 MED ORDER — NICOTINE 21 MG/24HR TD PT24
21.0000 mg | MEDICATED_PATCH | Freq: Every day | TRANSDERMAL | Status: DC
  Administered 2015-05-12: 21 mg via TRANSDERMAL
  Filled 2015-05-12: qty 1

## 2015-05-12 MED ORDER — LORAZEPAM 1 MG PO TABS
1.0000 mg | ORAL_TABLET | Freq: Four times a day (QID) | ORAL | Status: DC
  Administered 2015-05-12 – 2015-05-13 (×4): 1 mg via ORAL
  Filled 2015-05-12 (×4): qty 1

## 2015-05-12 MED ORDER — LORAZEPAM 1 MG PO TABS: 1.0000 mg | ORAL_TABLET | Freq: Four times a day (QID) | ORAL | Status: DC | PRN

## 2015-05-12 MED ORDER — TRAZODONE HCL 100 MG PO TABS
100.0000 mg | ORAL_TABLET | Freq: Every evening | ORAL | Status: DC | PRN
  Administered 2015-05-12: 100 mg via ORAL
  Filled 2015-05-12 (×2): qty 1

## 2015-05-12 MED ORDER — ALBUTEROL SULFATE HFA 108 (90 BASE) MCG/ACT IN AERS: 2.0000 | INHALATION_SPRAY | Freq: Four times a day (QID) | RESPIRATORY_TRACT | Status: DC | PRN

## 2015-05-12 MED ORDER — VITAMIN B-1 100 MG PO TABS
100.0000 mg | ORAL_TABLET | Freq: Every day | ORAL | Status: DC
  Administered 2015-05-13 – 2015-05-15 (×3): 100 mg via ORAL
  Filled 2015-05-12 (×5): qty 1

## 2015-05-12 MED ORDER — OLANZAPINE 5 MG PO TBDP
5.0000 mg | ORAL_TABLET | Freq: Three times a day (TID) | ORAL | Status: DC | PRN
  Administered 2015-05-14 – 2015-05-15 (×3): 5 mg via ORAL
  Filled 2015-05-12 (×3): qty 1

## 2015-05-12 MED ORDER — STERILE WATER FOR INJECTION IJ SOLN
INTRAMUSCULAR | Status: AC
  Filled 2015-05-12: qty 10

## 2015-05-12 MED ORDER — LORAZEPAM 1 MG PO TABS: 1.0000 mg | ORAL_TABLET | Freq: Every day | ORAL | Status: DC

## 2015-05-12 MED ORDER — TRAZODONE HCL 100 MG PO TABS
100.0000 mg | ORAL_TABLET | Freq: Every evening | ORAL | Status: DC | PRN
  Administered 2015-05-12: 100 mg via ORAL
  Filled 2015-05-12 (×5): qty 1

## 2015-05-12 MED ORDER — ZIPRASIDONE MESYLATE 20 MG IM SOLR
10.0000 mg | Freq: Once | INTRAMUSCULAR | Status: AC
  Administered 2015-05-12: 10 mg via INTRAMUSCULAR
  Filled 2015-05-12: qty 20

## 2015-05-12 MED ORDER — ONDANSETRON HCL 4 MG PO TABS: 4.0000 mg | ORAL_TABLET | Freq: Three times a day (TID) | ORAL | Status: DC | PRN

## 2015-05-12 MED ORDER — LORATADINE 10 MG PO TABS
10.0000 mg | ORAL_TABLET | Freq: Every day | ORAL | Status: DC | PRN
Start: 2015-05-12 — End: 2015-05-15
  Administered 2015-05-13 – 2015-05-14 (×2): 10 mg via ORAL
  Filled 2015-05-12 (×2): qty 1

## 2015-05-12 MED ORDER — NICOTINE 21 MG/24HR TD PT24
21.0000 mg | MEDICATED_PATCH | Freq: Every day | TRANSDERMAL | Status: DC
  Administered 2015-05-13 – 2015-05-15 (×3): 21 mg via TRANSDERMAL
  Filled 2015-05-12 (×6): qty 1

## 2015-05-12 MED ORDER — LORAZEPAM 1 MG PO TABS: 1.0000 mg | ORAL_TABLET | Freq: Three times a day (TID) | ORAL | Status: DC

## 2015-05-12 MED ORDER — LORAZEPAM 1 MG PO TABS: 0.0000 mg | ORAL_TABLET | Freq: Two times a day (BID) | ORAL | Status: DC

## 2015-05-12 MED ORDER — LOPERAMIDE HCL 2 MG PO CAPS: 2.0000 mg | ORAL_CAPSULE | ORAL | Status: DC | PRN

## 2015-05-12 MED ORDER — SODIUM CHLORIDE 0.9 % IV BOLUS (SEPSIS)
1000.0000 mL | Freq: Once | INTRAVENOUS | Status: AC
  Administered 2015-05-12: 1000 mL via INTRAVENOUS
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
## 2015-05-12 NOTE — ED Notes (Signed)
TTS at bedside. 

## 2015-05-12 NOTE — ED Notes (Signed)
Bed: RESA Expected date:  Expected time:  Means of arrival:  Comments: EMS ETOH overdose on depression meds

## 2015-05-12 NOTE — Progress Notes (Signed)
D: New admit from earlier in the day. Appears depressed. Cooperative with assessment. States he feels comfortable on unit. Had some questions r/t meds in the morning. Writer explained what was ordered. Also expressed some concern about programming, stating he feel like he needs substance abuse treatment more than anything. He denied SI/HI/AVH and contracts for safety.  A: Writer assisted pt with writing down concerns so he could discuss them with the treatment team in the morning. Pt was open to suggestion and actively participated in writing things down. Meds given as ordered. Support and encouragement provided. Safety has been maintained with q15 minute checks. Pt is cooperative and engaged in treatment.  R: Pt safe. Will continue current POC and q15 minute observation.

## 2015-05-12 NOTE — ED Notes (Signed)
Pt presents with SI, ingested 9 Trazodone and drank Alcohol.  Pt reports he is stressed without family support.  Previous SI Attempt, cut self in March.  Admits to hearing voices telling him to kill himself, that he is worthless.  Also sees a person by the name of [name], who is not really there he reports.  Pt states he drinks 4 40 oz beers per day.  Diagnosed with PTSD, Borderline, Major Depression.  Monitoring for safety, Q 15 min checks in effect.

## 2015-05-12 NOTE — ED Notes (Addendum)
David with poison control: expect drowsiness, watch for bradycardia, hypotension, QT prolongation. Recommend EKG, tylenol, 6 hours obs from time of ingestion

## 2015-05-12 NOTE — Tx Team (Signed)
Initial Interdisciplinary Treatment Plan   PATIENT STRESSORS: Financial difficulties Loss of support due to substance abuse Substance abuse Traumatic event   PATIENT STRENGTHS: Ability for insight Active sense of humor Average or above average intelligence Communication skills General fund of knowledge Physical Health Supportive family/friends   PROBLEM LIST: Problem List/Patient Goals Date to be addressed Date deferred Reason deferred Estimated date of resolution  "get a hold of alcohol problem" - substance abuse 05/12/2015      "hearing voices" - psychosis 05/12/2015     "thinking about suicide" - SI 05/12/2015     "worsening depression" - depression 05/12/2015                                    DISCHARGE CRITERIA:  Ability to meet basic life and health needs Improved stabilization in mood, thinking, and/or behavior Safe-care adequate arrangements made Verbal commitment to aftercare and medication compliance Withdrawal symptoms are absent or subacute and managed without 24-hour nursing intervention  PRELIMINARY DISCHARGE PLAN: Attend PHP/IOP Attend 12-step recovery group Return to previous living arrangement Return to previous work or school arrangements  PATIENT/FAMIILY INVOLVEMENT: This treatment plan has been presented to and reviewed with the patient, [name]. The patient and family have been given the opportunity to ask questions and make suggestions.  [name] 05/12/2015, 7:02 PM
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
## 2015-05-12 NOTE — Progress Notes (Signed)
The focus of this group is to help patients review their daily goal of treatment and discuss progress on daily workbooks . Pt attended the evening group session and responded to all discussion prompts from the [facility]. Pt shared that today was a good day on the unit, the highlight of which was being here. "I'm glad I came here. I think I needed it." Pt reported having no additional needs from Nursing Staff this evening but was encouraged to speak with either if MHTs or RN if any needs arose. Pt's affect was appropriate.

## 2015-05-12 NOTE — BH Assessment (Addendum)
05/12/2015 Reassessment:   ED staff noted that pt presents with SI, ingested 9 Trazodone and drank Alcohol. Pt reports he is stressed without family support. Previous SI Attempt, cut self in March. Admits to hearing voices telling him to kill himself, that he is worthless. Also sees a person by the name of [name], who is not really there he reports. Pt states he drinks 4 40 oz beers per day. Diagnosed with PTSD, Borderline, Major Depression.   Writer met with patient to complete his re-assessment. Patient reports he has suicidal ideations and a plan to kill himself. Patient denies plan this am but does have intent. Patient stating that he is unable to contract for safety. He is requesting and eager to be transferred to [facility] for inpatient treatment. Patient rates his depression on a scale of 1-10 as a 9. Patient's anxiety is rated as a 9 on a scal of 1-10. He rates his sleep on a scale of 1-10 as a 9. Patient's appetite is ok. Patient slept well last night.   Patient meets criteria for a 300 hall bed at [facility], per Dr. Darleene Cleaver and Jospehine, NP. TTS to seek appropriate inpatient treatment at [facility] or other facilities.

## 2015-05-12 NOTE — ED Notes (Signed)
Pt presents from home via EMS for ingestion of approximately nine of his trazadone around 2300 and ETOH.   Last VS: 101/71, 98hr, 18resp, [id number].
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
## 2015-05-12 NOTE — BH Assessment (Signed)
[facility] Assessment Progress Note  Per Corena Pilgrim, MD, this pt requires psychiatric hospitalization at this time.  Letitia Libra, RN, [facility] has assigned pt to [facility] Rm [id number].  Pt has signed Voluntary Admission and Consent for Treatment, as well as Consent to Release Information, and signed forms have been faxed to [facility].  Pt's nurse, Nena Jordan, has been notified, and agrees to send original paperwork along with pt via [facility], and to call report to [phone].  Jalene Mullet, [place] Triage Specialist [phone]

## 2015-05-13 DIAGNOSIS — R45851 Suicidal ideations: Secondary | ICD-10-CM

## 2015-05-13 DIAGNOSIS — F1020 Alcohol dependence, uncomplicated: Secondary | ICD-10-CM | POA: Diagnosis present

## 2015-05-13 DIAGNOSIS — F251 Schizoaffective disorder, depressive type: Secondary | ICD-10-CM | POA: Diagnosis present

## 2015-05-13 MED ORDER — LAMOTRIGINE 100 MG PO TABS
200.0000 mg | ORAL_TABLET | Freq: Every evening | ORAL | Status: DC
  Administered 2015-05-13 – 2015-05-14 (×2): 200 mg via ORAL
  Filled 2015-05-13: qty 6
  Filled 2015-05-13 (×3): qty 1
  Filled 2015-05-13: qty 6

## 2015-05-13 MED ORDER — VALACYCLOVIR HCL 500 MG PO TABS
1000.0000 mg | ORAL_TABLET | Freq: Every day | ORAL | Status: DC
  Administered 2015-05-13 – 2015-05-14 (×2): 1000 mg via ORAL
  Filled 2015-05-13 (×4): qty 2

## 2015-05-13 MED ORDER — VALACYCLOVIR HCL 500 MG PO TABS
1000.0000 mg | ORAL_TABLET | Freq: Every day | ORAL | Status: DC
  Filled 2015-05-13 (×3): qty 2

## 2015-05-13 MED ORDER — CHLORDIAZEPOXIDE HCL 25 MG PO CAPS: 25.0000 mg | ORAL_CAPSULE | ORAL | Status: DC

## 2015-05-13 MED ORDER — LURASIDONE HCL 40 MG PO TABS
120.0000 mg | ORAL_TABLET | Freq: Every day | ORAL | Status: DC
  Administered 2015-05-13 – 2015-05-15 (×3): 120 mg via ORAL
  Filled 2015-05-13: qty 3
  Filled 2015-05-13: qty 9
  Filled 2015-05-13: qty 3
  Filled 2015-05-13: qty 9
  Filled 2015-05-13 (×3): qty 3

## 2015-05-13 MED ORDER — TRAZODONE HCL 150 MG PO TABS
300.0000 mg | ORAL_TABLET | Freq: Every evening | ORAL | Status: DC | PRN
  Administered 2015-05-13 – 2015-05-14 (×2): 300 mg via ORAL
  Filled 2015-05-13: qty 6
  Filled 2015-05-13 (×2): qty 2

## 2015-05-13 MED ORDER — CHLORDIAZEPOXIDE HCL 25 MG PO CAPS: 25.0000 mg | ORAL_CAPSULE | Freq: Four times a day (QID) | ORAL | Status: DC | PRN

## 2015-05-13 MED ORDER — CHLORDIAZEPOXIDE HCL 25 MG PO CAPS
25.0000 mg | ORAL_CAPSULE | Freq: Four times a day (QID) | ORAL | Status: AC
  Administered 2015-05-13 – 2015-05-14 (×6): 25 mg via ORAL
  Filled 2015-05-13 (×6): qty 1

## 2015-05-13 MED ORDER — CHLORDIAZEPOXIDE HCL 25 MG PO CAPS
25.0000 mg | ORAL_CAPSULE | Freq: Three times a day (TID) | ORAL | Status: DC
  Administered 2015-05-15 (×2): 25 mg via ORAL
  Filled 2015-05-13 (×2): qty 1

## 2015-05-13 MED ORDER — CHLORDIAZEPOXIDE HCL 25 MG PO CAPS: 25.0000 mg | ORAL_CAPSULE | Freq: Every day | ORAL | Status: DC

## 2015-05-13 MED ORDER — HYDROXYZINE HCL 50 MG PO TABS
50.0000 mg | ORAL_TABLET | Freq: Four times a day (QID) | ORAL | Status: DC | PRN
  Administered 2015-05-13 – 2015-05-15 (×5): 50 mg via ORAL
  Filled 2015-05-13 (×5): qty 1

## 2015-05-13 NOTE — BHH Group Notes (Signed)
[facility] Mental [employer] Group Therapy  05/13/2015 , 1:47 PM    Type of Therapy:  Mental Health Association Presentation  Participation Level:  Active  Participation Quality:  Attentive  Affect:  Blunted  Cognitive:  Oriented  Insight:  Limited  Engagement in Therapy:  Engaged  Modes of Intervention:  Discussion, Education and Socialization  Summary of Progress/Problems:  [name] from [place] came to present his recovery story and play the guitar.  Sat quietly for the first half hour.  Left and did not return.  [name] B 05/13/2015 , 1:47 PM

## 2015-05-13 NOTE — BHH Suicide Risk Assessment (Signed)
[facility] Admission Suicide Risk Assessment Patient is a 29 y.o. male who has a hx of bipolar 1 disorder, schizoaffective disorder and depression who was transferred from [facility] ED after he presented there with complaints of worsening of depression for a week along with the suicide attempt and stating that he no longer wanted to live. Patient reported that he overdosed on 9 pills of trazodone along with drinking alcohol in order to die.   Nursing information obtained from:    Demographic factors:    Current Mental Status:    Loss Factors:    Historical Factors:    Risk Reduction Factors:    Total Time spent with patient: 30 minutes Principal Problem: Alcohol abuse with alcohol-induced mood disorder Diagnosis:   Patient Active Problem List   Diagnosis Date Noted  . Alcohol abuse with alcohol-induced mood disorder [F10.14] 05/13/2015    Priority: High  . Suicidal ideation [R45.851] 01/12/2014    Priority: High     Continued Clinical Symptoms:  Alcohol Use Disorder Identification Test Final Score (AUDIT): 23 The "Alcohol Use Disorders Identification Test", Guidelines for Use in Primary Care, Second Edition.  World Pharmacologist [facility]). Score between 0-7:  no or low risk or alcohol related problems. Score between 8-15:  moderate risk of alcohol related problems. Score between 16-19:  high risk of alcohol related problems. Score 20 or above:  warrants further diagnostic evaluation for alcohol dependence and treatment.   CLINICAL FACTORS:   Severe Anxiety and/or Agitation Alcohol/Substance Abuse/Dependencies Chronic Pain Previous Psychiatric Diagnoses and Treatments   Musculoskeletal: Strength & Muscle Tone: within normal limits Gait & Station: normal Patient leans: [name]  Psychiatric Specialty Exam: Physical Exam  ROS  Blood pressure 135/90, pulse 96, temperature 98.2 F (36.8 C), temperature source Oral, resp. rate 20, height 6' 1.5" (1.867 m), weight 125.193 kg (276 lb),  SpO2 100 %.Body mass index is 35.92 kg/(m^2).  General Appearance: Casual  Eye Contact::  Fair  Speech:  Clear and Coherent and Normal Rate  Volume:  Decreased  Mood:  Anxious, Depressed and Dysphoric  Affect:  Non-Congruent and Inappropriate  Thought Process:  Coherent and Intact  Orientation:  Full (Time, Place, and Person)  Thought Content:  Rumination  Suicidal Thoughts:  Yes.  with intent/plan, overdosed in order to kill self  Homicidal Thoughts:  No  Memory:  Immediate;   Fair Recent;   Fair Remote;   Fair  Judgement:  Poor  Insight:  Lacking  Psychomotor Activity:  Mannerisms  Concentration:  Fair  Recall:  [employer] of Knowledge:Fair  Language: Fair  Akathisia:  No  Handed:  Right  AIMS (if indicated):     Assets:  Desire for Improvement Vocational/Educational  Sleep:  Number of Hours: 5  Cognition: WNL  ADL's:  Impaired     COGNITIVE FEATURES THAT CONTRIBUTE TO RISK:  Polarized thinking and Thought constriction (tunnel vision)    SUICIDE RISK:   Severe:  Frequent, intense, and enduring suicidal ideation, specific plan, no subjective intent, but some objective markers of intent (i.e., choice of lethal method), the method is accessible, some limited preparatory behavior, evidence of impaired self-control, severe dysphoria/symptomatology, multiple risk factors present, and few if any protective factors, particularly a lack of social support.  PLAN OF CARE:  Patient is a 29 year old diagnosed with schizoaffective disorder, depressed type and alcohol use disorder, severe. Patient overdosed on trazodone along with 40 on beer last night in order to kill himself. Patient is struggling with alcohol use  along with depression, pain issues and does not want to live any longer. While here patient will undergo cognitive behavioral therapy, substance abuse counseling, group therapy and medication adjustment in order for him to safely and effectively participate in outpatient  treatment Medical Decision Making:  Established Problem, Stable/Improving (1), Review of Psycho-Social Stressors (1), Review or order clinical lab tests (1), Review and summation of old records (2), Review of Last Therapy Session (1), Review of Medication Regimen & Side Effects (2) and Review of New Medication or Change in Dosage (2)  I certify that inpatient services furnished can reasonably be expected to improve the patient's condition.   [place] 05/13/2015, 2:10 PM

## 2015-05-13 NOTE — Progress Notes (Signed)
Patient ID: [name], male   DOB: January 31, 1986, 31 y.o.   MRN: [id number]  DAR: Pt. Denies SI/HI and A/V Hallucinations. He denies withdrawal symptoms other than anxiety. He reports his sleep was poor, appetite is fair, energy level is low, and concentration level is poor. He reports his depression 8/10, hopelessness 7/10, and anxiety 9/10. Patient has been advised that he is able to attend substance abuse meetings on the 300 hall. Patient verbalized understanding. He decided to sign a 72 hour today and verbalized understanding of its implications.  Patient continues to report pain in his lower back which he states Tylenol does not help. Patient states, "I;m used to taking Flexeril." MD Dwyane Dee made aware of this. Support and encouragement provided to the patient. Scheduled medications administered to patient per physician's orders. Patient received PRN Vistaril for anxiety which proved to be helpful on reassessment. He is seen in the milieu at times and is attending some groups. Q15 minute checks are maintained for safety.

## 2015-05-13 NOTE — Tx Team (Signed)
Interdisciplinary Treatment Plan Update (Adult)  Date:  05/13/2015   Time Reviewed:  2:54 PM   Progress in Treatment: Attending groups: Yes. Participating in groups:  Yes. Taking medication as prescribed:  Yes. Tolerating medication:  Yes. Family/Significant othe contact made:  declines Patient understands diagnosis:  Yes  As evidenced by seeking help with depression and etoh use Discussing patient identified problems/goals with staff:  Yes, see initial care plan. Medical problems stabilized or resolved:  Yes. Denies suicidal/homicidal ideation: Yes. Issues/concerns per patient self-inventory:  No. Other:  New problem(s) identified:  Discharge Plan or Barriers: plans to return home at dc with outpatient follow up.  Reason for Continuation of Hospitalization: Anxiety Depression Medication stabilization Withdrawal symptoms  Comments:Patient admits that he was trying to kill himself tonight when he took 9 trazadone and drank beer in addition. Patient consumed four 40oz beers over the course of the day. Patient ingested the ETOH and trazadone around 22:30. He then called [facility] and informed them of his actions. They called [facility] who did an assessment and brought patient to [facility]. Pt reports multiple previous suicide attempts. Lamictal, Latuda trial with Librium for detox protocol Estimated length of stay:  4-5 days  New goal(s):  Review of initial/current patient goals per problem list:   Review of initial/current patient goals per problem list:  1. Goal(s): Patient will participate in aftercare plan   Met: Yes   Target date: 3-5 days post admission date   As evidenced by: Patient will participate within aftercare plan AEB aftercare provider and housing plan at discharge being identified.  05/13/2015: Return home, follow up outpt  2. Goal (s): Patient will exhibit decreased depressive symptoms and suicidal ideations.   Met: No   Target  date: 3-5 days post admission date   As evidenced by: Patient will utilize self rating of depression at 3 or below and demonstrate decreased signs of depression or be deemed stable for discharge by MD.  05/13/2015: Pt rates depression at a 10 today   3. Goal(s): Patient will demonstrate decreased signs and symptoms of anxiety.  Met:  No   Target date: 3-5 days post admission date   As evidenced by: Patient will utilize self rating of anxiety at 3 or below and demonstrated decreased signs of anxiety, or be deemed stable for discharge by MD  05/13/2015: Reports anxiety at a 10 today   4. Goal(s): Patient will demonstrate decreased signs of withdrawal due to substance abuse  Met: No    Target date: 3-5 days post admission date   As evidenced by: Patient will produce a CIWA/COWS score of 0, have stable vitals signs, and no symptoms of withdrawal  05/13/2015:  CIWA at a 1 today, fluctuating vitals   5. Goal(s): Patient will demonstrate decreased signs of psychosis  * Met: Yes  * Target date: 3-5 days post admission date  * As evidenced by: Patient will demonstrate decreased frequency of AVH or return to baseline function  05/13/2015:Pt denies psychosis today      Attendees: Patient:  05/13/2015 2:54 PM   Family:   05/13/2015 2:54 PM   Physician:  Hampton Abbot MD 05/13/2015 2:54 PM   Nursing:   Gaylan Gerold, RN 05/13/2015 2:54 PM   CSW:    [name], LCSW   05/13/2015 2:54 PM   Other:  05/13/2015 2:54 PM   Other:  Eduard Clos, LCSWA 05/13/2015 2:54 PM   Other:  Lars Pinks, Nurse CM 05/13/2015 2:54 PM  Other:  Lucinda Dell, Beverly Sessions TCT 05/13/2015 2:54 PM   Other:  Norberto Sorenson, [place]  05/13/2015 2:54 PM   Other:  05/13/2015 2:54 PM   Other:  05/13/2015 2:54 PM   Other:  05/13/2015 2:54 PM   Other:  05/13/2015 2:54 PM   Other:  05/13/2015 2:54 PM   Other:   05/13/2015 2:54 PM    Scribe for Treatment Team:   Trish Mage, 05/13/2015 2:54 PM
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
## 2015-05-13 NOTE — Plan of Care (Signed)
Problem: Diagnosis: Increased Risk For Suicide Attempt Goal: STG-Patient Will Comply With Medication Regime Outcome: Progressing Patient is compliant with medication regimen.
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
## 2015-05-13 NOTE — BHH Group Notes (Signed)
[facility] LCSW Aftercare Discharge Planning Group Note   05/13/2015 1:42 PM  Participation Quality:  Engaged  Mood/Affect:  Depressed  Depression Rating:  10  Anxiety Rating:  10  Thoughts of Suicide:  No Will you contract for safety?   NA  Current AVH:  No  Plan for Discharge/Comments:  Stated he is here due to extreme depression with accompanying SI prior to admission, as well as drinking "too much beer."  States he is depressed because he is working with a provider that he does not like, but they will not refer him out to another service.  Lives in his own apartment, and has minimal supports.  He is not interested in a referral to rehab.  "I just need a different SAIOP."  Transportation Means:  bus  Supports: formal only  [place], [place] B

## 2015-05-14 DIAGNOSIS — F1020 Alcohol dependence, uncomplicated: Secondary | ICD-10-CM

## 2015-05-14 NOTE — Progress Notes (Signed)
Patient ID: [name], male   DOB: [birth year], 29 y.o.   MRN: [id number] D: Client reports he was admitted "to get stable on my medications" noting "voices decreases with medication" Denies SHI. A: Writer provided emotional support reviewed medications, administered as ordered. Writer encouraged client to report any concerns. Staff will monitor q50mn for safety. R:Client is safe on the unit, attended karaoke.

## 2015-05-14 NOTE — BHH Suicide Risk Assessment (Signed)
[place] INPATIENT:  Family/Significant Other Suicide Prevention Education  Suicide Prevention Education:  Patient Refusal for Family/Significant Other Suicide Prevention Education: The patient [name] has refused to provide written consent for family/significant other to be provided Family/Significant Other Suicide Prevention Education during admission and/or prior to discharge.  Physician notified.  [name] B 05/14/2015, 4:14 PM

## 2015-05-14 NOTE — Progress Notes (Signed)
D: Pt is at this time alert and oriented x 4. Pt c/o of mild depression and anxiety; Pt however state he feels better than he was in the morning. Pt also c/o of moderate pain of 6 on a 0-10 pain scale. Pt denies SI/HI/AVH. Pt was isolative and withdrawn to his room. Pt has been very calm and cooperative. Pt. continues to be nonviolent A: Pt attended substance abuse meetings on the 300 hall. Medications administered as prescribed.  Support, encouragement, and safe environment provided.  15-minute safety checks continue.  R: Pt was med compliant. 15 min. Safety checks continue.

## 2015-05-14 NOTE — Progress Notes (Signed)
[facility] MD Progress Note  05/14/2015 5:17 PM [name]  MRN:  [id number] Subjective:  I'm doing better, I made a poor choice as I was drinking, I know I need to continue to not drink after discharge   Objective:Patient is a 29 year old male diagnosed with schizoaffective disorder, depressed type and alcohol use disorder.  Patient reports that he is doing better in regards to his mood, is no longer drunk, has no withdrawal symptoms and feels that the overdose was a mistake. Patient adds that he  knows he needs to work on staying sober, taking his medications regularly and see a therapist to help with his coping skills.   On a scale of 0-10, with 0 being no symptoms in 10 being the worst, patient reports that his depression is currently a 6 out of 10. He has that he's working on his coping skills, adds that cooking helps him stay balanced. He reports that he has good outpatient support and knows that he needs not drink. Patient states that he is no longer suicidal, and is working on ways to learn how to cope without overdosing again  Principal Problem: Schizophrenia, schizo-affective type, depressed Diagnosis:   Patient Active Problem List   Diagnosis Date Noted  . Schizophrenia, schizo-affective type, depressed [F25.1] 05/13/2015    Priority: High  . Suicidal ideation [R45.851] 01/12/2014    Priority: High  . Severe alcohol dependence [F10.20] 05/13/2015   Total Time spent with patient: 20 minutes   Past Medical History:  Past Medical History  Diagnosis Date  . Hypertension   . HIV disease   . PTSD (post-traumatic stress disorder)   . Bipolar 1 disorder   . Schizoaffective disorder   . Depression   . Seizures   . Herpes genitalia     Past Surgical History  Procedure Laterality Date  . Hand surgery    . Back surgery     Family History:  Family History  Problem Relation Age of Onset  . Alcohol abuse Mother   . Schizophrenia Father   . Depression Father   . Alcohol abuse  Father   . Alcohol abuse Paternal Uncle   . Alcohol abuse Paternal Uncle    Social History:  History  Alcohol Use  . 1.8 oz/week  . 3 Cans of beer per week    Comment: 60 days out of rehab, clean     History  Drug Use No    History   Social History  . Marital Status: Single    Spouse Name: [name]  . Number of Children: [name]  . Years of Education: [name]   Social History Main Topics  . Smoking status: Current Every Day Smoker -- 1.00 packs/day    Types: Cigarettes  . Smokeless tobacco: Never Used  . Alcohol Use: 1.8 oz/week    3 Cans of beer per week     Comment: 60 days out of rehab, clean  . Drug Use: No  . Sexual Activity: Yes    Birth Control/ Protection: Condom   Other Topics Concern  . None   Social History Narrative   Additional History:    Sleep: Fair  Appetite:  Fair   Assessment:  see above   Musculoskeletal: Strength & Muscle Tone: within normal limits Gait & Station: normal Patient leans: [name]   Psychiatric Specialty Exam: Physical Exam  Review of Systems  Constitutional: Negative.  Negative for fever and malaise/fatigue.  HENT: Negative.  Negative for congestion and sore throat.  Eyes: Negative.  Negative for blurred vision, double vision, discharge and redness.  Respiratory: Negative.  Negative for cough, shortness of breath and wheezing.   Cardiovascular: Negative.  Negative for chest pain and palpitations.  Gastrointestinal: Negative.  Negative for heartburn, nausea, vomiting, abdominal pain, diarrhea and constipation.  Genitourinary: Negative.  Negative for dysuria and urgency.  Musculoskeletal: Negative.  Negative for myalgias, back pain, joint pain and falls.  Skin: Negative.  Negative for rash.  Neurological: Negative.  Negative for dizziness, seizures, loss of consciousness, weakness and headaches.  Endo/Heme/Allergies: Negative.  Negative for environmental allergies.  Psychiatric/Behavioral: Positive for depression and substance abuse.  Negative for suicidal ideas, hallucinations and memory loss. The patient is nervous/anxious. The patient does not have insomnia.     Blood pressure 130/75, pulse 97, temperature 97.9 F (36.6 C), temperature source Oral, resp. rate 20, height 6' 1.5" (1.867 m), weight 125.193 kg (276 lb), SpO2 100 %.Body mass index is 35.92 kg/(m^2).  General Appearance: Casual  Eye Contact::  Fair  Speech:  Clear and Coherent and Normal Rate  Volume:  Normal  Mood:  Anxious and Depressed  Affect:  Non-Congruent and Full Range  Thought Process:  Coherent, Goal Directed and Intact  Orientation:  Full (Time, Place, and Person)  Thought Content:  Rumination  Suicidal Thoughts:  No  Homicidal Thoughts:  No  Memory:  Immediate;   Fair Recent;   Fair Remote;   Fair  Judgement:  Fair  Insight:  Shallow  Psychomotor Activity:  Normal  Concentration:  Fair  Recall:  [place]: Fair  Akathisia:  No  Handed:  Right  AIMS (if indicated):     Assets:  Housing Physical Health Social Support  ADL's:  Intact  Cognition: WNL  Sleep:  Number of Hours: 5.5     Current Medications: Current Facility-Administered Medications  Medication Dose Route Frequency Provider Last Rate Last Dose  . acetaminophen (TYLENOL) tablet 650 mg  650 mg Oral Q6H PRN [name], NP   650 mg at 05/13/15 2141  . albuterol (PROVENTIL HFA;VENTOLIN HFA) 108 (90 BASE) MCG/ACT inhaler 2 puff  2 puff Inhalation Q6H PRN Niel Hummer, NP      . alum & mag hydroxide-simeth (MAALOX/MYLANTA) 200-200-20 MG/5ML suspension 30 mL  30 mL Oral Q4H PRN [name], NP      . chlordiazePOXIDE (LIBRIUM) capsule 25 mg  25 mg Oral Q6H PRN Benjamine Mola, FNP      . chlordiazePOXIDE (LIBRIUM) capsule 25 mg  25 mg Oral QID Benjamine Mola, FNP   25 mg at 05/14/15 1655   Followed by  . [START ON 05/15/2015] chlordiazePOXIDE (LIBRIUM) capsule 25 mg  25 mg Oral TID Benjamine Mola, FNP       Followed by  . [START ON  05/16/2015] chlordiazePOXIDE (LIBRIUM) capsule 25 mg  25 mg Oral BH-qamhs Benjamine Mola, FNP       Followed by  . [START ON 05/17/2015] chlordiazePOXIDE (LIBRIUM) capsule 25 mg  25 mg Oral Daily Benjamine Mola, FNP      . Emtricitab-Rilpivir-Tenofov DF 200-25-300 MG TABS 1 tablet  1 tablet Oral QHS Niel Hummer, NP   1 tablet at 05/13/15 2142  . hydrOXYzine (ATARAX/VISTARIL) tablet 50 mg  50 mg Oral Q6H PRN Hampton Abbot, MD   50 mg at 05/14/15 8119  . lamoTRIgine (LAMICTAL) tablet 200 mg  200 mg Oral QPM Hampton Abbot, MD   200  mg at 05/13/15 2141  . loperamide (IMODIUM) capsule 2-4 mg  2-4 mg Oral PRN Niel Hummer, NP      . loratadine (CLARITIN) tablet 10 mg  10 mg Oral Daily PRN Niel Hummer, NP   10 mg at 05/14/15 1256  . lurasidone (LATUDA) tablet 120 mg  120 mg Oral Daily Hampton Abbot, MD   120 mg at 05/14/15 0815  . magnesium hydroxide (MILK OF MAGNESIA) suspension 30 mL  30 mL Oral Daily PRN [name], NP   30 mL at 05/14/15 1655  . multivitamin with minerals tablet 1 tablet  1 tablet Oral Daily Niel Hummer, NP   1 tablet at 05/14/15 0815  . nicotine (NICODERM CQ - dosed in mg/24 hours) patch 21 mg  21 mg Transdermal Daily Laverle Hobby, PA-C   21 mg at 05/14/15 0800  . OLANZapine zydis (ZYPREXA) disintegrating tablet 5 mg  5 mg Oral Q8H PRN Niel Hummer, NP   5 mg at 05/14/15 4270  . ondansetron (ZOFRAN-ODT) disintegrating tablet 4 mg  4 mg Oral Q6H PRN Niel Hummer, NP      . thiamine (VITAMIN B-1) tablet 100 mg  100 mg Oral Daily Niel Hummer, NP   100 mg at 05/14/15 0815  . traZODone (DESYREL) tablet 300 mg  300 mg Oral QHS PRN Benjamine Mola, FNP   300 mg at 05/13/15 2141  . valACYclovir (VALTREX) tablet 1,000 mg  1,000 mg Oral QHS Benjamine Mola, FNP   1,000 mg at 05/13/15 2141    Lab Results: No results found for this or any previous visit (from the past 48 hour(s)).  Physical Findings: AIMS: Facial and Oral Movements Muscles of Facial Expression: None,  normal Lips and Perioral Area: None, normal Jaw: None, normal Tongue: None, normal,Extremity Movements Upper (arms, wrists, hands, fingers): None, normal Lower (legs, knees, ankles, toes): None, normal, Trunk Movements Neck, shoulders, hips: None, normal, Overall Severity Severity of abnormal movements (highest score from questions above): None, normal Incapacitation due to abnormal movements: None, normal Patient's awareness of abnormal movements (rate only patient's report): No Awareness, Dental Status Current problems with teeth and/or dentures?: No Does patient usually wear dentures?: No  CIWA:  CIWA-Ar Total: 1 COWS:     Treatment Plan Summary: Daily contact with patient to assess and evaluate symptoms and progress in treatment, Medication management and Plan For patient to participate in groups and therapeutic milieu  EKG done shows:Sinus tachycardia, Atrial premature complexes, Probable left atrial enlargement, Borderline T abnormalities, anterior leads, Borderline prolonged QT interval. No significant change since last tracing, these results were discussed with the patient   patient was continued on his Latuda 120 mg daily, Lamictal 200 mg at bedtime and trazodone 300 mg at bedtime. Patient was changed from Ativan protocol to Librium protocol and currently has no withdrawal symptoms Patient was ordered Motrin for pain as needed    Medical Decision Making:  Established Problem, Stable/Improving (1), Review of Psycho-Social Stressors (1), Review and summation of old records (2), Review of Last Therapy Session (1) and Review of Medication Regimen & Side Effects (2)     [name] 05/14/2015, 5:17 PM

## 2015-05-14 NOTE — BHH Group Notes (Signed)
[facility] Group Notes:  (Counselor/Nursing/MHT/Case Management/Adjunct)  05/14/2015 1:15PM  Type of Therapy:  Group Therapy  Participation Level:  Active  Participation Quality:  Appropriate  Affect:  Flat  Cognitive:  Oriented  Insight:  Improving  Engagement in Group:  Limited  Engagement in Therapy:  Limited  Modes of Intervention:  Discussion, Exploration and Socialization  Summary of Progress/Problems: The topic for group was balance in life.  Pt participated in the discussion about when their life was in balance and out of balance and how this feels.  Pt discussed ways to get back in balance and short term goals they can work on to get where they want to be. Stayed for most of the group.  States he is balanced, and knows he is because "I could work an 8 hour day."  When pressed further, stated he is alert and focused, unlike his state of mind prior to admission.  Shared that cooking is something that helps him get balanced, especially if he is doing it for a guest or friends.   [name] B 05/14/2015 4:12 PM

## 2015-05-15 MED ORDER — CETIRIZINE HCL 10 MG PO TABS: 10.0000 mg | ORAL_TABLET | Freq: Every day | ORAL | Status: DC

## 2015-05-15 MED ORDER — HYDROXYZINE HCL 25 MG PO TABS
25.0000 mg | ORAL_TABLET | Freq: Two times a day (BID) | ORAL | Status: DC | PRN
  Filled 2015-05-15 (×2): qty 6

## 2015-05-15 MED ORDER — EMTRICITAB-RILPIVIR-TENOFOV DF 200-25-300 MG PO TABS: 1.0000 | ORAL_TABLET | Freq: Every day | ORAL | Status: DC

## 2015-05-15 MED ORDER — DICYCLOMINE HCL 10 MG PO CAPS: 10.0000 mg | ORAL_CAPSULE | Freq: Three times a day (TID) | ORAL | Status: DC

## 2015-05-15 MED ORDER — LAMOTRIGINE 200 MG PO TABS: 200.0000 mg | ORAL_TABLET | Freq: Every evening | ORAL | Status: DC

## 2015-05-15 MED ORDER — NICOTINE 21-14-7 MG/24HR TD KIT: 1.0000 | PACK | Freq: Every day | TRANSDERMAL | Status: DC

## 2015-05-15 MED ORDER — VALACYCLOVIR HCL 1 G PO TABS: 1000.0000 mg | ORAL_TABLET | Freq: Every day | ORAL | Status: DC

## 2015-05-15 MED ORDER — LURASIDONE HCL 120 MG PO TABS: 120.0000 mg | ORAL_TABLET | Freq: Every day | ORAL | Status: DC

## 2015-05-15 MED ORDER — ADULT MULTIVITAMIN W/MINERALS CH: 1.0000 | ORAL_TABLET | Freq: Every day | ORAL | Status: DC

## 2015-05-15 MED ORDER — TRAZODONE HCL 300 MG PO TABS: 300.0000 mg | ORAL_TABLET | Freq: Every evening | ORAL | Status: DC | PRN

## 2015-05-15 MED ORDER — ACETAMINOPHEN 325 MG PO TABS: 650.0000 mg | ORAL_TABLET | Freq: Four times a day (QID) | ORAL | Status: DC | PRN

## 2015-05-15 NOTE — BHH Suicide Risk Assessment (Signed)
[facility] Discharge Suicide Risk Assessment   Demographic Factors:  Low socioeconomic status, Living alone and Unemployed  Total Time spent with patient: 30 minutes  Musculoskeletal: Strength & Muscle Tone: within normal limits Gait & Station: normal Patient leans: [name]  Psychiatric Specialty Exam: Physical Exam  ROS  Blood pressure 110/72, pulse 101, temperature 99.7 F (37.6 C), temperature source Oral, resp. rate 16, height 6' 1.5" (1.867 m), weight 125.193 kg (276 lb), SpO2 100 %.Body mass index is 35.92 kg/(m^2).  General Appearance: Casual and Fairly Groomed  Engineer, water::  Fair  Speech:  Clear and Coherent and Normal Rate409  Volume:  Normal  Mood:  Euthymic  Affect:  Appropriate, Congruent and Full Range  Thought Process:  Coherent, Goal Directed, Linear and Logical  Orientation:  Full (Time, Place, and Person)  Thought Content:  Negative  Suicidal Thoughts:  No  Homicidal Thoughts:  No  Memory:  Immediate;   Fair Recent;   Fair Remote;   Fair  Judgement:  Fair  Insight:  Fair  Psychomotor Activity:  Normal  Concentration:  Fair  Recall:  [employer] [place]  Language: Fair  Akathisia:  Negative  Handed:  Right  AIMS (if indicated):     Assets:  Communication Skills Desire for Improvement Financial Resources/Insurance Housing Social Support  Sleep:  Number of Hours: 5.25  Cognition: WNL  ADL's:  Intact   Have you used any form of tobacco in the last 30 days? (Cigarettes, Smokeless Tobacco, Cigars, and/or Pipes): Yes  Has this patient used any form of tobacco in the last 30 days? (Cigarettes, Smokeless Tobacco, Cigars, and/or Pipes) Yes, A prescription for an FDA-approved tobacco cessation medication was offered at discharge and the patient refused  Mental Status Per Nursing Assessment::   On Admission:     Current Mental Status by Physician: Pt interviewed. Chart reviewed. Case discussed with unit staff. Pt reports feeling better now, since he  stopped drinking. He decided to abstain from alcohol, and will continue therapy. He realizes that he needs to adhere to his medications. Sleep still chronically poor, but latuda helps. Appetite good. Pt denies SI/HI/AVH/paranoia. Pt reports readiness for discharge. Pt has a few friends in his neighborhood for support. His mother and cousin call to check on him regularly.  Loss Factors: Financial problems/change in socioeconomic status  Historical Factors: Prior suicide attempts, Family history of mental illness or substance abuse and Impulsivity  Risk Reduction Factors:   Sense of responsibility to family, Positive social support, Positive therapeutic relationship and Positive coping skills or problem solving skills  Continued Clinical Symptoms:  Alcohol/Substance Abuse/Dependencies Schizophrenia:   Depressive state  Cognitive Features That Contribute To Risk:  Thought constriction (tunnel vision)    Suicide Risk:  Mild:  Suicidal ideation of limited frequency, intensity, duration, and specificity.  There are no identifiable plans, no associated intent, mild dysphoria and related symptoms, good self-control (both objective and subjective assessment), few other risk factors, and identifiable protective factors, including available and accessible social support.  Principal Problem: Schizophrenia, schizo-affective type, depressed Discharge Diagnoses:  Patient Active Problem List   Diagnosis Date Noted  . Schizophrenia, schizo-affective type, depressed [F25.1] 05/13/2015  . Severe alcohol dependence [F10.20] 05/13/2015  . Suicidal ideation [R45.851] 01/12/2014    Follow-up Information    Follow up with [facility] On 05/27/2015.   Why:  Wed. at 1:30 with Dr Rosine Door    Also, they told me you are now free to pursue services with [facility].  Their number is [phone]   Contact information:   [place]  [phone]      Follow up with [facility] On  05/21/2015.   Why:  Thursday at 1:45 with Angie   Contact information:   [place] [facility]/Follow-up recommendations:  Activity:  as tolerated Diet:  per PCP Tests:  per PCP Other:  f/u as scheduled with psychiatrist and therapist. He agrees to attend [facility] meetings.  Is patient on multiple antipsychotic therapies at discharge:  No   Has Patient had three or more failed trials of antipsychotic monotherapy by history:  No  Recommended Plan for Multiple Antipsychotic Therapies: NA    Dominick Morella 05/15/2015, 10:56 AM

## 2015-05-15 NOTE — Progress Notes (Signed)
  [facility] Adult Case Management Discharge Plan :  Will you be returning to the same living situation after discharge:  Yes,  home At discharge, do you have transportation home?: Yes,  bus pass Do you have the ability to pay for your medications: Yes,  MCD  Release of information consent forms completed and in the chart;  Patient's signature needed at discharge.  Patient to Follow up at: Follow-up Information    Follow up with [facility] On 05/27/2015.   Why:  Wed. at 1:30 with Dr Rosine Door    Also, they told me you are now free to pursue services with [facility].  Their number is [phone]   Contact information:   [place]  [phone]      Follow up with [facility] On 05/21/2015.   Why:  Thursday at 1:45 with Angie   Contact information:   [place]      Patient denies SI/HI: Yes,  yes    Safety Planning and Suicide Prevention discussed: Yes,  yes  Have you used any form of tobacco in the last 30 days? (Cigarettes, Smokeless Tobacco, Cigars, and/or Pipes): Yes  Has patient been referred to the Quitline?: Yes, faxed on 05/15/15  Trish Mage 05/15/2015, 11:52 AM

## 2015-05-15 NOTE — Progress Notes (Cosign Needed)
Pt d/c to home with sample meds. Rx's, and d/c information. Pt verbalizes understanding. Pt denies s.i.

## 2015-05-15 NOTE — Discharge Summary (Signed)
Physician Discharge Summary Note  Patient:  [name] is an 29 y.o., male MRN:  [id number] DOB:  03-30-1986 Patient phone:  [phone] (home)  Patient address:   [address] [place] [address],  Total Time spent with patient: 30 minutes  Date of Admission:  05/12/2015 Date of Discharge: 05/15/2015  Reason for Admission:  Mood stabilization treatments  Principal Problem: Schizophrenia, schizo-affective type, depressed Discharge Diagnoses: Patient Active Problem List   Diagnosis Date Noted  . Schizophrenia, schizo-affective type, depressed [F25.1] 05/13/2015  . Severe alcohol dependence [F10.20] 05/13/2015  . Suicidal ideation [R45.851] 01/12/2014    Musculoskeletal: Strength & Muscle Tone: within normal limits Gait & Station: normal Patient leans: [name]  Psychiatric Specialty Exam: Physical Exam  Review of Systems  Constitutional: Negative.   HENT: Negative.   Eyes: Negative.   Respiratory: Negative.   Cardiovascular: Negative.   Gastrointestinal: Negative.   Genitourinary: Negative.   Musculoskeletal: Negative.   Skin: Negative.   Neurological: Negative.   Endo/Heme/Allergies: Negative.   Psychiatric/Behavioral: Positive for depression (Stabilized with treatments) and substance abuse (Previous alcohol abuse before admission ). Negative for suicidal ideas, hallucinations and memory loss. The patient is not nervous/anxious and does not have insomnia.     Blood pressure 110/72, pulse 101, temperature 99.7 F (37.6 C), temperature source Oral, resp. rate 16, height 6' 1.5" (1.867 m), weight 125.193 kg (276 lb), SpO2 100 %.Body mass index is 35.92 kg/(m^2).       Have you used any form of tobacco in the last 30 days? (Cigarettes, Smokeless Tobacco, Cigars, and/or Pipes): Yes  Has this patient used any form of tobacco in the last 30 days? (Cigarettes, Smokeless Tobacco, Cigars, and/or Pipes) Yes, A prescription for an FDA-approved tobacco cessation  medication was offered at discharge and the patient refused   Past Medical History:  Past Medical History  Diagnosis Date  . Hypertension   . HIV disease   . PTSD (post-traumatic stress disorder)   . Bipolar 1 disorder   . Schizoaffective disorder   . Depression   . Seizures   . Herpes genitalia     Past Surgical History  Procedure Laterality Date  . Hand surgery    . Back surgery     Family History:  Family History  Problem Relation Age of Onset  . Alcohol abuse Mother   . Schizophrenia Father   . Depression Father   . Alcohol abuse Father   . Alcohol abuse Paternal Uncle   . Alcohol abuse Paternal Uncle    Social History:  History  Alcohol Use  . 1.8 oz/week  . 3 Cans of beer per week    Comment: 60 days out of rehab, clean     History  Drug Use No    History   Social History  . Marital Status: Single    Spouse Name: [name]  . Number of Children: [name]  . Years of Education: [name]   Social History Main Topics  . Smoking status: Current Every Day Smoker -- 1.00 packs/day    Types: Cigarettes  . Smokeless tobacco: Never Used  . Alcohol Use: 1.8 oz/week    3 Cans of beer per week     Comment: 60 days out of rehab, clean  . Drug Use: No  . Sexual Activity: Yes    Birth Control/ Protection: Condom   Other Topics Concern  . None   Social History Narrative   Risk to Self: Is patient at  risk for suicide?: Yes What has been your use of drugs/alcohol within the last 12 months?:  (etoh (daily use)) Risk to Others:   Prior Inpatient Therapy:   Prior Outpatient Therapy:    Level of Care:  OP  Hospital Course:     [name] is a 29 y.o. male who has a hx of bipolar 1 disorder, schizoaffective disorder and depression who was transferred from [facility] ED after he presented there with complaints of worsening of depression for a week along with a suicide attempt (overdosed on 9 Trazodone) and stating that he no longer wanted to live. Upon arrival to the ED,  patient reported that he consumed four 40oz beers over the course of the day. Patient ingested the ETOH and trazadone around 22:30. He then called [facility] and informed them of his actions. They called [facility] who did an assessment and brought patient to [facility]. Patient reported multiple previous suicide attempts.         [name] was admitted to the adult 500 unit. He was evaluated and his symptoms were identified. Medication management was discussed and initiated. Patient was restarted on his Latuda 120 mg daily for schizoaffective disorder, Lamictal 200 mg daily for improved stability of mood and Trazodone 300 mg hs prn for insomnia. The patient completed the librium detox protocol for the purposes of alcohol detox with no reported complications.  He was oriented to the unit and encouraged to participate in unit programming. Medical problems were identified and treated appropriately. Home medication was restarted as needed.        The patient was evaluated each day by a clinical provider to ascertain the patient's response to treatment.  Improvement was noted by the patient's report of decreasing symptoms, improved sleep and appetite, affect, medication tolerance, behavior, and participation in unit programming.  He was asked each day to complete a self inventory noting mood, mental status, pain, new symptoms, anxiety and concerns. The patient was agreeable to attending substance abuse meetings on the 300 hall. He verbalized that drinking the alcohol was a poor choice and denied intent to continue after leaving the hospital. Also reported that overdosing was a mistake that occurred due to being intoxicated.          He responded well to medication and being in a therapeutic and supportive environment. Positive and appropriate behavior was noted and the patient was motivated for recovery.  The patient worked closely with the treatment team and case manager to develop a  discharge plan with appropriate goals. Coping skills, problem solving as well as relaxation therapies were also part of the unit programming. Patient denied during follow up assessments that he was no longer experiencing auditory hallucinations. On admission he reported seeing a lady dressed in black. He also denied hearing any voices telling him to kill himself.          By the day of discharge he was in much improved condition than upon admission.  Symptoms were reported as significantly decreased or resolved completely.  The patient denied SI/HI and voiced no AVH. He was motivated to continue taking medication with a goal of continued improvement in mental health.   [name] was discharged home with a plan to follow up as noted below. The patient was provided with three day sample medications and prescriptions at time of discharge. He left [facility] in stable condition with all belongings returned to him.   Consults:  None  Significant Diagnostic  Studies:  Chemistry panel, CBC, UDS negative, alcohol level 300 on admission, EKG,   Discharge Vitals:   Blood pressure 110/72, pulse 101, temperature 99.7 F (37.6 C), temperature source Oral, resp. rate 16, height 6' 1.5" (1.867 m), weight 125.193 kg (276 lb), SpO2 100 %. Body mass index is 35.92 kg/(m^2). Lab Results:   No results found for this or any previous visit (from the past 72 hour(s)).  Physical Findings: AIMS: Facial and Oral Movements Muscles of Facial Expression: None, normal Lips and Perioral Area: None, normal Jaw: None, normal Tongue: None, normal,Extremity Movements Upper (arms, wrists, hands, fingers): None, normal Lower (legs, knees, ankles, toes): None, normal, Trunk Movements Neck, shoulders, hips: None, normal, Overall Severity Severity of abnormal movements (highest score from questions above): None, normal Incapacitation due to abnormal movements: None, normal Patient's awareness of abnormal movements (rate only  patient's report): No Awareness, Dental Status Current problems with teeth and/or dentures?: No Does patient usually wear dentures?: No  CIWA:  CIWA-Ar Total: 0 COWS:      See Psychiatric Specialty Exam and Suicide Risk Assessment completed by Attending Physician prior to discharge.  Discharge destination:  Home  Is patient on multiple antipsychotic therapies at discharge:  No   Has Patient had three or more failed trials of antipsychotic monotherapy by history:  No  Recommended Plan for Multiple Antipsychotic Therapies: NA     Medication List    STOP taking these medications        acetaminophen-codeine 300-30 MG per tablet  Commonly known as:  TYLENOL #3     clonazePAM 1 MG tablet  Commonly known as:  KLONOPIN      TAKE these medications      Indication   acetaminophen 325 MG tablet  Commonly known as:  TYLENOL  Take 2 tablets (650 mg total) by mouth every 6 (six) hours as needed for mild pain.   Indication:  Fever, Pain     albuterol 108 (90 BASE) MCG/ACT inhaler  Commonly known as:  PROVENTIL HFA;VENTOLIN HFA  Inhale 2 puffs into the lungs every 6 (six) hours as needed for wheezing or shortness of breath.      cetirizine 10 MG tablet  Commonly known as:  ZYRTEC  Take 1 tablet (10 mg total) by mouth daily.   Indication:  Hayfever     cyclobenzaprine 10 MG tablet  Commonly known as:  FLEXERIL  Take 10 mg by mouth 3 (three) times daily as needed for muscle spasms.      dicyclomine 10 MG capsule  Commonly known as:  BENTYL  Take 1 capsule (10 mg total) by mouth 4 (four) times daily -  before meals and at bedtime.   Indication:  Irritable Bowel Syndrome     Emtricitab-Rilpivir-Tenofov DF 200-25-300 MG Tabs  Commonly known as:  COMPLERA  Take 1 tablet by mouth at bedtime. Take with food   Indication:  HIV Disease     hydrOXYzine 25 MG tablet  Commonly known as:  ATARAX/VISTARIL  Take 25 mg by mouth 2 (two) times daily as needed for anxiety.      lactulose  10 GM/15ML solution  Commonly known as:  CHRONULAC  Take 30 g by mouth 2 (two) times daily as needed for mild constipation.      lamoTRIgine 200 MG tablet  Commonly known as:  LAMICTAL  Take 1 tablet (200 mg total) by mouth every evening.   Indication:  Schizophrenia     Lurasidone HCl 120 MG Tabs  Take 1 tablet (120 mg total) by mouth daily.   Indication:  Schizophrenia     meclizine 25 MG tablet  Commonly known as:  ANTIVERT  Take 25 mg by mouth 3 (three) times daily as needed for dizziness.      multivitamin with minerals Tabs tablet  Take 1 tablet by mouth daily.   Indication:  Vitamin Supplementation     Nicotine 21-14-7 MG/24HR Kit  Place 1 patch onto the skin daily.   Indication:  Nicotine Addiction     rizatriptan 10 MG disintegrating tablet  Commonly known as:  MAXALT-MLT  Take 10 mg by mouth as needed for migraine.      trazodone 300 MG tablet  Commonly known as:  DESYREL  Take 1 tablet (300 mg total) by mouth at bedtime as needed for sleep.   Indication:  Trouble Sleeping     valACYclovir 1000 MG tablet  Commonly known as:  VALTREX  Take 1 tablet (1,000 mg total) by mouth daily.   Indication:  Genital Herpes       Follow-up Information    Follow up with [facility] On 05/27/2015.   Why:  Wed. at 1:30 with Dr Rosine Door    Also, they told me you are now free to pursue services with [facility].  Their number is [phone]   Contact information:   [place]  [phone]      Follow up with [facility] On 05/21/2015.   Why:  Thursday at 1:45 with Angie   Contact information:   [place] [phone]      Follow-up recommendations:   Activity: as tolerated Diet: per PCP Tests: per PCP Other: f/u as scheduled with psychiatrist and therapist. He agrees to attend [place] meetings.  Comments:   Take all your medications as prescribed by your mental healthcare provider.  Report any adverse effects and  or reactions from your medicines to your outpatient provider promptly.  Patient is instructed and cautioned to not engage in alcohol and or illegal drug use while on prescription medicines.  In the event of worsening symptoms, patient is instructed to call the crisis hotline, 911 and or go to the nearest ED for appropriate evaluation and treatment of symptoms.  Follow-up with your primary care provider for your other medical issues, concerns and or health care needs.   Total Discharge Time: Greater than 30 minutes  Signed: Elmarie Shiley, NP-C 05/15/2015, 12:51 PM

## 2016-01-11 ENCOUNTER — Emergency Department (HOSPITAL_COMMUNITY)
Admission: EM | Admit: 2016-01-11 | Discharge: 2016-01-11 | Disposition: A | Discharge status: Home / Self Care | Payer: Medicare HMO | Attending: Emergency Medicine | Admitting: Emergency Medicine

## 2016-01-11 ENCOUNTER — Encounter (HOSPITAL_COMMUNITY): Payer: Self-pay | Admitting: *Deleted

## 2016-01-11 DIAGNOSIS — F17210 Nicotine dependence, cigarettes, uncomplicated: Secondary | ICD-10-CM | POA: Diagnosis not present

## 2016-01-11 DIAGNOSIS — R55 Syncope and collapse: Secondary | ICD-10-CM | POA: Insufficient documentation

## 2016-01-11 DIAGNOSIS — I10 Essential (primary) hypertension: Secondary | ICD-10-CM | POA: Insufficient documentation

## 2016-01-11 LAB — I-STAT TROPONIN, ED: Troponin i, poc: 0 ng/mL (ref 0.00–0.08)

## 2016-01-11 LAB — CBG MONITORING, ED: Glucose-Capillary: 86 mg/dL (ref 65–99)

## 2016-01-11 LAB — CBC
HCT: 46.5 % (ref 39.0–52.0)
Hemoglobin: 15.5 g/dL (ref 13.0–17.0)
MCH: 32.8 pg (ref 26.0–34.0)
MCHC: 33.3 g/dL (ref 30.0–36.0)
MCV: 98.5 fL (ref 78.0–100.0)
Platelets: 205 10*3/uL (ref 150–400)
RBC: 4.72 MIL/uL (ref 4.22–5.81)
RDW: 13.7 % (ref 11.5–15.5)
WBC: 6.5 10*3/uL (ref 4.0–10.5)

## 2016-01-11 LAB — BASIC METABOLIC PANEL
Anion gap: 16 — ABNORMAL HIGH (ref 5–15)
BUN: 10 mg/dL (ref 6–20)
CO2: 20 mmol/L — ABNORMAL LOW (ref 22–32)
Calcium: 9.8 mg/dL (ref 8.9–10.3)
Chloride: 100 mmol/L — ABNORMAL LOW (ref 101–111)
Creatinine, Ser: 1.64 mg/dL — ABNORMAL HIGH (ref 0.61–1.24)
GFR calc Af Amer: >60 mL/min (ref >60)
GFR calc non Af Amer: 54 mL/min — ABNORMAL LOW (ref >60)
Glucose, Bld: 86 mg/dL (ref 65–99)
Potassium: 3.8 mmol/L (ref 3.5–5.1)
Sodium: 136 mmol/L (ref 135–145)

## 2016-01-11 NOTE — ED Notes (Signed)
Pt reports feeling lightheaded and dizzy for months but had syncopal episode while at work today. Appears anxious at triage. HR 130.

## 2016-02-01 ENCOUNTER — Encounter: Payer: Self-pay | Admitting: Neurology

## 2016-02-01 ENCOUNTER — Ambulatory Visit (INDEPENDENT_AMBULATORY_CARE_PROVIDER_SITE_OTHER): Payer: Medicare HMO | Admitting: Neurology

## 2016-02-01 VITALS — BP 126/82 | HR 102 | Ht 73.5 in | Wt 272.5 lb

## 2016-02-01 DIAGNOSIS — G43719 Chronic migraine without aura, intractable, without status migrainosus: Secondary | ICD-10-CM

## 2016-02-01 DIAGNOSIS — R42 Dizziness and giddiness: Secondary | ICD-10-CM

## 2016-02-01 DIAGNOSIS — G43909 Migraine, unspecified, not intractable, without status migrainosus: Secondary | ICD-10-CM

## 2016-02-01 HISTORY — DX: Migraine, unspecified, not intractable, without status migrainosus: G43.909

## 2016-02-01 HISTORY — DX: Dizziness and giddiness: R42

## 2016-02-01 NOTE — Progress Notes (Signed)
Reason for visit: Dizziness  Referring physician: Dr. Tretha Sciara is a 30 y.o. male  History of present illness:  [name] is a 30 year old right-handed black male with a history of schizophrenia, migraine headaches, and dizziness. The patient has had a two-year history of problems with dizziness that is described as a lightheaded floaty feeling that has gradually worsened over time. The patient indicates that the episodes are almost daily in nature, they may last several hours or all day long. The patient indicates that he may have some nausea with the dizziness. The patient has frequent headaches, 3 or 4 week, but he does not always relate the dizziness with the headache. He does indicate that he may have shortness of breath, increased heart rate, and anxiety feelings with the dizziness. The dizziness is worse when he is standing, but still can occur while sitting or lying down. He may have some occasional numbness occasionally in the hands and feet. He has fallen on one occasion with the dizziness without injury. He denies any issues controlling the bowels or the bladder. He may have some blurring of vision. He denies any change in hearing, ringing in the ears, or ear pain. He has not had any blackouts. He comes to this office for an evaluation.  Past Medical History  Diagnosis Date  . Hypertension   . HIV disease ([place])   . PTSD (post-traumatic stress disorder)   . Bipolar 1 disorder ([place])   . Schizoaffective disorder ([place])   . Depression   . Seizures ([place])   . Herpes genitalia   . Dizziness and giddiness 02/01/2016  . Migraine headache 02/01/2016    Past Surgical History  Procedure Laterality Date  . Hand surgery    . Back surgery      Family History  Problem Relation Age of Onset  . Alcohol abuse Mother   . Schizophrenia Father   . Depression Father   . Alcohol abuse Father   . Alcohol abuse Paternal Uncle   . Alcohol abuse Paternal Uncle     Social  history:  reports that he has been smoking Cigarettes.  He has been smoking about 1.00 pack per day. He has never used smokeless tobacco. He reports that he drinks about 8.4 oz of alcohol per week. He reports that he does not use illicit drugs.  Medications:  Prior to Admission medications   Medication Sig Start Date End Date Taking? Authorizing Provider  acetaminophen (TYLENOL) 325 MG tablet Take 2 tablets (650 mg total) by mouth every 6 (six) hours as needed for mild pain. 05/15/15  Yes Niel Hummer, NP  acetaminophen-codeine (TYLENOL #3) 300-30 MG tablet  01/16/16  Yes Historical Provider, MD  albuterol (PROVENTIL HFA;VENTOLIN HFA) 108 (90 BASE) MCG/ACT inhaler Inhale 2 puffs into the lungs every 6 (six) hours as needed for wheezing or shortness of breath.   Yes Historical Provider, MD  ALPRAZolam Duanne Moron) 1 MG tablet Take 1 mg by mouth.   Yes Historical Provider, MD  cetirizine (ZYRTEC) 10 MG tablet Take 1 tablet (10 mg total) by mouth daily. 05/15/15  Yes Niel Hummer, NP  cloNIDine (CATAPRES) 0.1 MG tablet  07/14/15  Yes Historical Provider, MD  cyclobenzaprine (FLEXERIL) 10 MG tablet Take 10 mg by mouth 3 (three) times daily as needed for muscle spasms.   Yes Historical Provider, MD  diclofenac sodium (VOLTAREN) 1 % GEL  02/17/14  Yes Historical Provider, MD  dicyclomine (BENTYL) 10 MG capsule Take  1 capsule (10 mg total) by mouth 4 (four) times daily -  before meals and at bedtime. 05/15/15  Yes Niel Hummer, NP  Emtricitab-Rilpivir-Tenofov DF (COMPLERA) 200-25-300 MG TABS Take 1 tablet by mouth at bedtime. Take with food 05/15/15  Yes Niel Hummer, NP  HYDROcodone-acetaminophen (NORCO) 5-325 MG tablet Take by mouth. 03/26/14  Yes Historical Provider, MD  hydrOXYzine (ATARAX/VISTARIL) 25 MG tablet Take 25 mg by mouth 2 (two) times daily as needed for anxiety.   Yes Historical Provider, MD  lactulose (CHRONULAC) 10 GM/15ML solution Take 30 g by mouth 2 (two) times daily as needed for mild constipation.    Yes Historical Provider, MD  lamoTRIgine (LAMICTAL) 200 MG tablet Take 1 tablet (200 mg total) by mouth every evening. 05/15/15  Yes Niel Hummer, NP  loratadine (CLARITIN) 10 MG tablet Take by mouth.   Yes Historical Provider, MD  lurasidone 120 MG TABS Take 1 tablet (120 mg total) by mouth daily. 05/15/15  Yes Niel Hummer, NP  meclizine (ANTIVERT) 25 MG tablet Take 25 mg by mouth 3 (three) times daily as needed for dizziness.   Yes Historical Provider, MD  meloxicam (MOBIC) 15 MG tablet Take by mouth.   Yes Historical Provider, MD  Multiple Vitamin (MULTIVITAMIN WITH MINERALS) TABS tablet Take 1 tablet by mouth daily. 05/15/15  Yes Niel Hummer, NP  naproxen (NAPROSYN) 500 MG tablet Take by mouth.   Yes Historical Provider, MD  Nicotine 21-14-7 MG/24HR KIT Place 1 patch onto the skin daily. 05/15/15  Yes Niel Hummer, NP  rizatriptan (MAXALT-MLT) 10 MG disintegrating tablet Take 10 mg by mouth as needed for migraine.  02/21/14  Yes Historical Provider, MD  traZODone (DESYREL) 300 MG tablet Take 1 tablet (300 mg total) by mouth at bedtime as needed for sleep. 05/15/15  Yes Niel Hummer, NP  valACYclovir (VALTREX) 1000 MG tablet Take 1 tablet (1,000 mg total) by mouth daily. 05/15/15  Yes Niel Hummer, NP  zolpidem (AMBIEN) 10 MG tablet Take 10 mg by mouth.   Yes Historical Provider, MD     No Known Allergies  ROS:  Out of a complete 14 system review of symptoms, the patient complains only of the following symptoms, and all other reviewed systems are negative.  Fatigue Chest pain Skin rash Blurred vision Cough, wheezing, snoring Diarrhea, constipation Feeling hot Memory loss, dizziness Depression, anxiety, not enough sleep, decreased energy, suicidal thoughts, hallucinations, racing thoughts Insomnia  Blood pressure 126/82, pulse 102, height 6' 1.5" (1.867 m), weight 272 lb 8 oz (123.605 kg).   Blood pressure, right arm, standing is 142/92. Blood pressure, right arm, sitting is  136/90.  Physical Exam  General: The patient is alert and cooperative at the time of the examination.  Eyes: Pupils are equal, round, and reactive to light. Discs are flat bilaterally.  Ears: Tympanic membranes are clear bilaterally.  Neck: The neck is supple, no carotid bruits are noted.  Respiratory: The respiratory examination is clear.  Cardiovascular: The cardiovascular examination reveals a regular rate and rhythm, no obvious murmurs or rubs are noted.  Skin: Extremities are without significant edema.  Neurologic Exam  Mental status: The patient is alert and oriented x 3 at the time of the examination. The patient has apparent normal recent and remote memory, with an apparently normal attention span and concentration ability.  Cranial nerves: Facial symmetry is present. There is good sensation of the face to pinprick and soft touch bilaterally. The strength  of the facial muscles and the muscles to head turning and shoulder shrug are normal bilaterally. Speech is well enunciated, no aphasia or dysarthria is noted. Extraocular movements are full. Visual fields are full. The tongue is midline, and the patient has symmetric elevation of the soft palate. No obvious hearing deficits are noted.  Motor: The motor testing reveals 5 over 5 strength of all 4 extremities. Good symmetric motor tone is noted throughout.  Sensory: Sensory testing is intact to pinprick, soft touch, vibration sensation, and position sense on all 4 extremities. No evidence of extinction is noted.  Coordination: Cerebellar testing reveals good finger-nose-finger and heel-to-shin bilaterally.  Gait and station: Gait is normal. Tandem gait is normal. Romberg is negative. No drift is seen.  Reflexes: Deep tendon reflexes are symmetric and normal bilaterally. Toes are downgoing bilaterally.   Assessment/Plan:  1. Dizziness  2. Migraine headache  3. Depression, anxiety  4. HIV infection  The patient  reports a long-standing history of dizziness. The patient indicates that the dizziness is a bit worse with standing, but is also associated with episodes of shortness of breath, increased heart rate, anxiety. The episodes may represent panic type attacks. He indicates that alprazolam and meclizine will help the symptoms. The patient will be sent for further evaluation with MRI of the brain, EEG evaluation. If the studies are unremarkable, the patient may benefit from a more longer acting antianxiety medication. The patient is on a multitude of medications, he does not clearly relate his dizziness to initiation or cessation of any particular medication. He will follow-up in about 4 or 5 months.  [name] MD 02/01/2016 8:33 PM  [facility] [address] [place], [place] [address]  Phone [phone] Fax [phone]

## 2016-02-18 ENCOUNTER — Ambulatory Visit
Admission: RE | Admit: 2016-02-18 | Discharge: 2016-02-18 | Disposition: A | Discharge status: Home / Self Care | Payer: Medicare HMO | Source: Ambulatory Visit | Attending: Neurology | Admitting: Neurology

## 2016-02-18 DIAGNOSIS — G43719 Chronic migraine without aura, intractable, without status migrainosus: Secondary | ICD-10-CM

## 2016-02-18 DIAGNOSIS — R42 Dizziness and giddiness: Secondary | ICD-10-CM

## 2016-02-18 MED ORDER — GADOBENATE DIMEGLUMINE 529 MG/ML IV SOLN
20.0000 mL | Freq: Once | INTRAVENOUS | Status: AC | PRN
  Administered 2016-02-18: 20 mL via INTRAVENOUS

## 2016-02-21 ENCOUNTER — Telehealth: Payer: Self-pay | Admitting: Neurology

## 2016-02-21 NOTE — Telephone Encounter (Signed)
  I called patient. MRI the brain was unremarkable. EEG is pending.  MRI brain 02/19/16:  IMPRESSION: Unremarkable MRI scan of the brain with and without contrast

## 2016-03-01 ENCOUNTER — Other Ambulatory Visit: Payer: Medicare HMO

## 2016-03-01 ENCOUNTER — Telehealth: Payer: Self-pay | Admitting: *Deleted

## 2016-03-01 NOTE — Telephone Encounter (Signed)
------------------------------------------------------------   [name]             CID [id number]  Patient SAME                 Pt's Dr Jannifer Franklin       Area Code 336 Phone# 198 [name]     RE RETURNING A MISSED CALL FROM 5/22                                                                      Disp:Y/N N If Y = C/B If No Response In 53mnutes ============================================================ Called pt unable to reach pt. ds

## 2016-03-02 ENCOUNTER — Encounter: Payer: Self-pay | Admitting: Neurology

## 2016-03-03 ENCOUNTER — Telehealth: Payer: Self-pay | Admitting: *Deleted

## 2016-03-03 NOTE — Telephone Encounter (Signed)
Message For: OFFICE               Taken 25-MAY-17 at 10:24AM by [facility] ------------------------------------------------------------ Jolene Provost Bugge              CID [id number]  Patient SAME                 Pt's Dr Jannifer Franklin       Area Code 336 Phone# [phone] * DOB [birth year]     RE WANTS TO RESCHEDULE HIS APPT                                                                           Disp:Y/N N If Y = C/B If No Response In 58mnutes ============================================================ I called pt, to r/s appt unable to reach pt.

## 2016-04-07 ENCOUNTER — Telehealth: Payer: Self-pay | Admitting: Neurology

## 2016-04-07 ENCOUNTER — Ambulatory Visit (INDEPENDENT_AMBULATORY_CARE_PROVIDER_SITE_OTHER): Payer: Medicare HMO | Admitting: Neurology

## 2016-04-07 DIAGNOSIS — R42 Dizziness and giddiness: Secondary | ICD-10-CM

## 2016-04-07 DIAGNOSIS — R55 Syncope and collapse: Secondary | ICD-10-CM

## 2016-04-07 DIAGNOSIS — G43719 Chronic migraine without aura, intractable, without status migrainosus: Secondary | ICD-10-CM

## 2016-04-07 MED ORDER — TOPIRAMATE 25 MG PO TABS: ORAL_TABLET | ORAL | Status: DC

## 2016-04-07 NOTE — Telephone Encounter (Signed)
I called patient. EEG study was unremarkable. The patient still having headaches, 3-4 times a week. He has Maxalt to take if needed, I'll start low-dose Topamax for the headaches.

## 2016-04-07 NOTE — Procedures (Signed)
    History:  [name] is a 30 year old gentleman with a history of schizophrenia, migraine headaches, and dizziness. The patient is having frequent headaches. The patient is having episodes of shortness of breath, increased heart rate, and anxiety. The patient is being evaluated for possible seizures versus panic attacks.  This is a routine EEG. No skull defects are noted. Medications include Tylenol No. 3, albuterol inhaler, Zyrtec, Catapres, Complera, hydrocodone, Vistaril, lactulose, lamotrigine, Claritin, Antivert, Mobic, multivitamins, Maxalt, Valtrex, and Ambien.   EEG classification: Normal awake  Description of the recording: The background rhythms of this recording consists of a fairly well modulated medium amplitude alpha rhythm of 8 Hz that is reactive to eye opening and closure. As the record progresses, the patient appears to remain in the waking state throughout the recording. Photic stimulation was not performed. Hyperventilation was performed, resulting in a minimal buildup of the background rhythm activities without significant slowing seen. At no time during the recording does there appear to be evidence of spike or spike wave discharges or evidence of focal slowing. EKG monitor shows no evidence of cardiac rhythm abnormalities with a heart rate of 102.  Impression: This is a normal EEG recording in the waking state. No evidence of ictal or interictal discharges are seen.

## 2016-07-05 ENCOUNTER — Encounter: Payer: Self-pay | Admitting: Neurology

## 2016-07-05 ENCOUNTER — Ambulatory Visit (INDEPENDENT_AMBULATORY_CARE_PROVIDER_SITE_OTHER): Payer: Medicare HMO | Admitting: Neurology

## 2016-07-05 VITALS — BP 128/86 | HR 80 | Ht 74.0 in | Wt 272.0 lb

## 2016-07-05 DIAGNOSIS — G43719 Chronic migraine without aura, intractable, without status migrainosus: Secondary | ICD-10-CM | POA: Diagnosis not present

## 2016-07-05 DIAGNOSIS — R42 Dizziness and giddiness: Secondary | ICD-10-CM | POA: Diagnosis not present

## 2016-07-05 MED ORDER — TOPIRAMATE 100 MG PO TABS: 100.0000 mg | ORAL_TABLET | Freq: Every day | ORAL | 5 refills | Status: DC

## 2016-07-05 NOTE — Progress Notes (Signed)
Reason for visit: Dizziness, headache  [name] is an 30 y.o. male  History of present illness:  [name] is a 30 year old right-handed black male with a history of bipolar disorder, schizoaffective disorder. The patient has reported daily events of dizziness. He indicates that this is occurring mainly while he is at work and the episodes are associated with shortness of breath and palpitations of the heart. The patient generally does much better when he is at home. The patient works at [employer] as a Scientist, water quality. The patient is on Vistaril and alprazolam, but he takes these medications at night, not during the day. He indicates that his headaches are also daily in nature. He will sometimes miss work because the episodes of dizziness.  Past Medical History:  Diagnosis Date  . Bipolar 1 disorder ([place])   . Depression   . Dizziness and giddiness 02/01/2016  . Herpes genitalia   . HIV disease ([place])   . Hypertension   . Migraine headache 02/01/2016  . PTSD (post-traumatic stress disorder)   . Schizoaffective disorder ([place])   . Seizures ([place])     Past Surgical History:  Procedure Laterality Date  . BACK SURGERY    . HAND SURGERY      Family History  Problem Relation Age of Onset  . Alcohol abuse Mother   . Schizophrenia Father   . Depression Father   . Alcohol abuse Father   . Alcohol abuse Paternal Uncle   . Alcohol abuse Paternal Uncle     Social history:  reports that he has been smoking Cigarettes.  He has been smoking about 1.00 pack per day. He has never used smokeless tobacco. He reports that he drinks about 8.4 oz of alcohol per week . He reports that he does not use drugs.   No Known Allergies  Medications:  Prior to Admission medications   Medication Sig Start Date End Date Taking? Authorizing Provider  acetaminophen (TYLENOL) 325 MG tablet Take 2 tablets (650 mg total) by mouth every 6 (six) hours as needed for mild pain. 05/15/15  Yes Niel Hummer, NP    acetaminophen-codeine (TYLENOL #3) 300-30 MG tablet  01/16/16  Yes Historical Provider, MD  albuterol (PROVENTIL HFA;VENTOLIN HFA) 108 (90 BASE) MCG/ACT inhaler Inhale 2 puffs into the lungs every 6 (six) hours as needed for wheezing or shortness of breath.   Yes Historical Provider, MD  ALPRAZolam Duanne Moron) 1 MG tablet Take 1 mg by mouth.   Yes Historical Provider, MD  cetirizine (ZYRTEC) 10 MG tablet Take 1 tablet (10 mg total) by mouth daily. 05/15/15  Yes Niel Hummer, NP  cloNIDine (CATAPRES) 0.1 MG tablet  07/14/15  Yes Historical Provider, MD  cyclobenzaprine (FLEXERIL) 10 MG tablet Take 10 mg by mouth 3 (three) times daily as needed for muscle spasms.   Yes Historical Provider, MD  diclofenac sodium (VOLTAREN) 1 % GEL  02/17/14  Yes Historical Provider, MD  dicyclomine (BENTYL) 10 MG capsule Take 1 capsule (10 mg total) by mouth 4 (four) times daily -  before meals and at bedtime. 05/15/15  Yes Niel Hummer, NP  Emtricitab-Rilpivir-Tenofov DF (COMPLERA) 200-25-300 MG TABS Take 1 tablet by mouth at bedtime. Take with food 05/15/15  Yes Niel Hummer, NP  HYDROcodone-acetaminophen (NORCO) 5-325 MG tablet Take by mouth. 03/26/14  Yes Historical Provider, MD  hydrOXYzine (ATARAX/VISTARIL) 25 MG tablet Take 25 mg by mouth 2 (two) times daily as needed for anxiety.   Yes Historical Provider,  MD  lactulose (CHRONULAC) 10 GM/15ML solution Take 30 g by mouth 2 (two) times daily as needed for mild constipation.   Yes Historical Provider, MD  lamoTRIgine (LAMICTAL) 200 MG tablet Take 1 tablet (200 mg total) by mouth every evening. 05/15/15  Yes Niel Hummer, NP  loratadine (CLARITIN) 10 MG tablet Take by mouth.   Yes Historical Provider, MD  lurasidone 120 MG TABS Take 1 tablet (120 mg total) by mouth daily. 05/15/15  Yes Niel Hummer, NP  meclizine (ANTIVERT) 25 MG tablet Take 25 mg by mouth 3 (three) times daily as needed for dizziness.   Yes Historical Provider, MD  meloxicam (MOBIC) 15 MG tablet Take by mouth.    Yes Historical Provider, MD  Multiple Vitamin (MULTIVITAMIN WITH MINERALS) TABS tablet Take 1 tablet by mouth daily. 05/15/15  Yes Niel Hummer, NP  naproxen (NAPROSYN) 500 MG tablet Take by mouth.   Yes Historical Provider, MD  Nicotine 21-14-7 MG/24HR KIT Place 1 patch onto the skin daily. 05/15/15  Yes Niel Hummer, NP  rizatriptan (MAXALT-MLT) 10 MG disintegrating tablet Take 10 mg by mouth as needed for migraine.  02/21/14  Yes Historical Provider, MD  topiramate (TOPAMAX) 25 MG tablet Take one tablet at night for one week, then take 2 tablets at night 04/07/16  Yes Kathrynn Ducking, MD  traZODone (DESYREL) 300 MG tablet Take 1 tablet (300 mg total) by mouth at bedtime as needed for sleep. 05/15/15  Yes Niel Hummer, NP  valACYclovir (VALTREX) 1000 MG tablet Take 1 tablet (1,000 mg total) by mouth daily. 05/15/15  Yes Niel Hummer, NP  zolpidem (AMBIEN) 10 MG tablet Take 10 mg by mouth.   Yes Historical Provider, MD    ROS:  Out of a complete 14 system review of symptoms, the patient complains only of the following symptoms, and all other reviewed systems are negative.  Activity change Cough, wheezing Constipation, nausea Insomnia Dizziness, headache, speech difficulty, weakness, passing out Depression, hallucinations, suicidal falls  Blood pressure 128/86, pulse 80, height _0  (1.88 m), weight 272 lb (123.4 kg).  Physical Exam  General: The patient is alert and cooperative at the time of the examination.  Skin: No significant peripheral edema is noted.   Neurologic Exam  Mental status: The patient is alert and oriented x 3 at the time of the examination. The patient has apparent normal recent and remote memory, with an apparently normal attention span and concentration ability.   Cranial nerves: Facial symmetry is present. Speech is normal, no aphasia or dysarthria is noted. Extraocular movements are full. Visual fields are full.  Motor: The patient has good strength in all  4 extremities.  Sensory examination: Soft touch sensation is symmetric on the face, arms, and legs.  Coordination: The patient has good finger-nose-finger and heel-to-shin bilaterally.  Gait and station: The patient has a normal gait. Tandem gait is normal. Romberg is negative. No drift is seen.  Reflexes: Deep tendon reflexes are symmetric.   MRI brain 02/19/16:  IMPRESSION: Unremarkable MRI scan of the brain with and without contrast   Assessment/Plan:  1. Episodes of dizziness  2. Daily headache  The patient will increase the Topamax to 75 mg at night for week and then go to 100 mg at night. The patient may benefit from a long-acting benzodiazepine during the daytime to help the episodes of dizziness that appears to be associated with other symptoms of anxiety. The episodes generally occur at work. The  patient has requested that I keep him out of work for the rest of the week, I have denied the request. The patient will follow-up in 4 months, sooner if needed. MRI of the brain and EEG evaluations were normal.  [name] MD 07/05/2016 12:16 PM  [facility] [address] [place], [place] [address]  Phone [phone] Fax [phone]

## 2016-07-05 NOTE — Patient Instructions (Signed)
   With the Topamax 25 mg tablets, begin 3 tablets at night for one week, then start one of the 100 mg tablets at night.

## 2016-11-07 ENCOUNTER — Encounter: Payer: Self-pay | Admitting: Adult Health

## 2016-11-07 ENCOUNTER — Ambulatory Visit (INDEPENDENT_AMBULATORY_CARE_PROVIDER_SITE_OTHER): Payer: Medicare HMO | Admitting: Adult Health

## 2016-11-07 VITALS — BP 133/86 | HR 109 | Ht 74.0 in | Wt 275.0 lb

## 2016-11-07 DIAGNOSIS — F250 Schizoaffective disorder, bipolar type: Secondary | ICD-10-CM | POA: Diagnosis not present

## 2016-11-07 DIAGNOSIS — G43019 Migraine without aura, intractable, without status migrainosus: Secondary | ICD-10-CM

## 2016-11-07 MED ORDER — TOPIRAMATE 100 MG PO TABS: 150.0000 mg | ORAL_TABLET | Freq: Every day | ORAL | 5 refills | Status: DC

## 2016-11-07 NOTE — Progress Notes (Signed)
PATIENT: [name] DOB: [birth year]  REASON FOR VISIT: follow up- headache HISTORY FROM: patient  HISTORY OF PRESENT ILLNESS: [name] is a 31 year old male with a history of bipolar disorder, schizoaffective disorder and headaches. He returns today for follow-up. At the last visit his Topamax was increased to 100 milligrams daily. He reports that this has been beneficial. His headaches will improve median daily to 3-4 times a week. His headaches typically occur across the forehead and in the temporal regions bilaterally. He does have photophobia, phonophobia, nausea and vomiting. His headaches typically last 3 or 4 hours. He usually has to lay down and use Tylenol or Advil to get relief. He reports that he is seeing a new psychiatrist at [facility]. He returns today for an evaluation.  HISTORY 07/05/16: [name] is a 31 year old right-handed black male with a history of bipolar disorder, schizoaffective disorder. The patient has reported daily events of dizziness. He indicates that this is occurring mainly while he is at work and the episodes are associated with shortness of breath and palpitations of the heart. The patient generally does much better when he is at home. The patient works at [employer] as a Scientist, water quality. The patient is on Vistaril and alprazolam, but he takes these medications at night, not during the day. He indicates that his headaches are also daily in nature. He will sometimes miss work because the episodes of dizziness.  REVIEW OF SYSTEMS: Out of a complete 14 system review of symptoms, the patient complains only of the following symptoms, and all other reviewed systems are negative.  Cough, wheezing, choking, chest pain, insomnia, speech difficulty, weakness, tremors, passing out, dizziness, headache, activity change, appetite change, chills  ALLERGIES: No Known Allergies  HOME MEDICATIONS: Outpatient Medications Prior to Visit  Medication Sig Dispense Refill  .  acetaminophen (TYLENOL) 325 MG tablet Take 2 tablets (650 mg total) by mouth every 6 (six) hours as needed for mild pain.    [name] acetaminophen-codeine (TYLENOL #3) 300-30 MG tablet     . albuterol (PROVENTIL HFA;VENTOLIN HFA) 108 (90 BASE) MCG/ACT inhaler Inhale 2 puffs into the lungs every 6 (six) hours as needed for wheezing or shortness of breath.    . ALPRAZolam (XANAX) 1 MG tablet Take 1 mg by mouth.    . cetirizine (ZYRTEC) 10 MG tablet Take 1 tablet (10 mg total) by mouth daily.    . cloNIDine (CATAPRES) 0.1 MG tablet     . cyclobenzaprine (FLEXERIL) 10 MG tablet Take 10 mg by mouth 3 (three) times daily as needed for muscle spasms.    . diclofenac sodium (VOLTAREN) 1 % GEL     . dicyclomine (BENTYL) 10 MG capsule Take 1 capsule (10 mg total) by mouth 4 (four) times daily -  before meals and at bedtime.    [name] Emtricitab-Rilpivir-Tenofov DF (COMPLERA) 200-25-300 MG TABS Take 1 tablet by mouth at bedtime. Take with food 30 tablet   . HYDROcodone-acetaminophen (NORCO) 5-325 MG tablet Take by mouth.    . hydrOXYzine (ATARAX/VISTARIL) 25 MG tablet Take 25 mg by mouth 2 (two) times daily as needed for anxiety.    [name] lactulose (CHRONULAC) 10 GM/15ML solution Take 30 g by mouth 2 (two) times daily as needed for mild constipation.    [name] lamoTRIgine (LAMICTAL) 200 MG tablet Take 1 tablet (200 mg total) by mouth every evening. 30 tablet 0  . loratadine (CLARITIN) 10 MG tablet Take by mouth.    . lurasidone 120 MG  TABS Take 1 tablet (120 mg total) by mouth daily. 30 tablet 0  . meclizine (ANTIVERT) 25 MG tablet Take 25 mg by mouth 3 (three) times daily as needed for dizziness.    . meloxicam (MOBIC) 15 MG tablet Take by mouth.    . Multiple Vitamin (MULTIVITAMIN WITH MINERALS) TABS tablet Take 1 tablet by mouth daily.    . naproxen (NAPROSYN) 500 MG tablet Take by mouth.    . Nicotine 21-14-7 MG/24HR KIT Place 1 patch onto the skin daily.  0  . rizatriptan (MAXALT-MLT) 10 MG disintegrating tablet Take 10  mg by mouth as needed for migraine.     . topiramate (TOPAMAX) 100 MG tablet Take 1 tablet (100 mg total) by mouth at bedtime. 30 tablet 5  . traZODone (DESYREL) 300 MG tablet Take 1 tablet (300 mg total) by mouth at bedtime as needed for sleep. 30 tablet 0  . valACYclovir (VALTREX) 1000 MG tablet Take 1 tablet (1,000 mg total) by mouth daily.    [name] zolpidem (AMBIEN) 10 MG tablet Take 10 mg by mouth.     No facility-administered medications prior to visit.     PAST MEDICAL HISTORY: Past Medical History:  Diagnosis Date  . Bipolar 1 disorder ([place])   . Depression   . Dizziness and giddiness 02/01/2016  . Herpes genitalia   . HIV disease ([place])   . Hypertension   . Migraine headache 02/01/2016  . PTSD (post-traumatic stress disorder)   . Schizoaffective disorder ([place])   . Seizures ([place])     PAST SURGICAL HISTORY: Past Surgical History:  Procedure Laterality Date  . BACK SURGERY    . HAND SURGERY      FAMILY HISTORY: Family History  Problem Relation Age of Onset  . Alcohol abuse Mother   . Schizophrenia Father   . Depression Father   . Alcohol abuse Father   . Alcohol abuse Paternal Uncle   . Alcohol abuse Paternal Uncle     SOCIAL HISTORY: Social History   Social History  . Marital status: Single    Spouse name: [name]  . Number of children: [name]  . Years of education: [name]   Occupational History  . [employer]    Social History Main Topics  . Smoking status: Current Every Day Smoker    Packs/day: 1.00    Types: Cigarettes  . Smokeless tobacco: Never Used  . Alcohol use 8.4 oz/week    14 Cans of beer per week     Comment: About 2 beers per day  . Drug use: No  . Sexual activity: Yes    Birth control/ protection: Condom   Other Topics Concern  . Not on file   Social History Narrative   Lives at home alone   Right-handed   Drinks 2-3 sodas per day      PHYSICAL EXAM  Vitals:   11/07/16 1329  BP: 133/86  Pulse: (!) 109  Weight: 275 lb (124.7 kg)    Height: 6' 2"  (1.88 m)   Body mass index is 35.31 kg/m.  Generalized: Well developed, in no acute distress   Neurological examination  Mentation: Alert oriented to time, place, history taking. Follows all commands speech and language fluent Cranial nerve II-XII: Pupils were equal round reactive to light. Extraocular movements were full, visual field were full on confrontational test. Facial sensation and strength were normal. Uvula tongue midline. Head turning and shoulder shrug  were normal and symmetric. Motor: The motor testing reveals 5 over  5 strength of all 4 extremities. Good symmetric motor tone is noted throughout.  Sensory: Sensory testing is intact to soft touch on all 4 extremities. No evidence of extinction is noted.  Coordination: Cerebellar testing reveals good finger-nose-finger and heel-to-shin bilaterally.  Gait and station: Gait is normal. Tandem gait is normal. Romberg is negative. No drift is seen.  Reflexes: Deep tendon reflexes are symmetric and normal bilaterally.   DIAGNOSTIC DATA (LABS, IMAGING, TESTING) - I reviewed patient records, labs, notes, testing and imaging myself where available.  Lab Results  Component Value Date   WBC 6.5 01/11/2016   HGB 15.5 01/11/2016   HCT 46.5 01/11/2016   MCV 98.5 01/11/2016   PLT 205 01/11/2016      Component Value Date/Time   NA 136 01/11/2016 1714   K 3.8 01/11/2016 1714   CL 100 (L) 01/11/2016 1714   CO2 20 (L) 01/11/2016 1714   GLUCOSE 86 01/11/2016 1714   BUN 10 01/11/2016 1714   CREATININE 1.64 (H) 01/11/2016 1714   CALCIUM 9.8 01/11/2016 1714   PROT 9.0 (H) 05/12/2015 0117   ALBUMIN 4.4 05/12/2015 0117   AST 27 05/12/2015 0117   ALT 28 05/12/2015 0117   ALKPHOS 83 05/12/2015 0117   BILITOT 0.4 05/12/2015 0117   GFRNONAA 54 (L) 01/11/2016 1714   GFRAA >60 01/11/2016 1714      ASSESSMENT AND PLAN 31 y.o. year old male  has a past medical history of Bipolar 1 disorder ([place]); Depression; Dizziness and  giddiness (02/01/2016); Herpes genitalia; HIV disease ([place]); Hypertension; Migraine headache (02/01/2016); PTSD (post-traumatic stress disorder); Schizoaffective disorder ([place]); and Seizures ([place]). here with:  1. Headaches  2. Schizoaffective disorder  The patient continues to have headaches. We will increase his Topamax 150 mg at bedtime. Advised patient that if his headache frequency or severity does not improve he should let us know. He will follow-up in 6 months or sooner if needed.  I spent 15 minutes with the patient 50% of this time was spent reviewing migraine triggers and medication Topamax.      Ward Givens, MSN, NP-C 11/07/2016, 1:46 PM [facility] [address], [place], [place] [address] [phone]

## 2016-11-07 NOTE — Patient Instructions (Signed)
Increase Topamax to 150 mg at bedtime (1.5 tablets) If your symptoms worsen or you develop new symptoms please let us know.

## 2016-11-07 NOTE — Progress Notes (Signed)
I have read the note, and I agree with the clinical assessment and plan.  Gladyce Mcray KEITH   

## 2017-04-19 ENCOUNTER — Ambulatory Visit (HOSPITAL_COMMUNITY)
Admission: RE | Admit: 2017-04-19 | Discharge: 2017-04-19 | Disposition: A | Discharge status: Home / Self Care | Payer: Medicare HMO | Source: Ambulatory Visit | Attending: Vascular Surgery | Admitting: Vascular Surgery

## 2017-04-19 ENCOUNTER — Other Ambulatory Visit (HOSPITAL_COMMUNITY): Payer: Self-pay | Admitting: Internal Medicine

## 2017-04-19 DIAGNOSIS — R600 Localized edema: Secondary | ICD-10-CM

## 2017-05-01 ENCOUNTER — Other Ambulatory Visit: Payer: Self-pay | Admitting: Adult Health

## 2017-05-05 ENCOUNTER — Telehealth: Payer: Self-pay | Admitting: Adult Health

## 2017-05-05 MED ORDER — TOPIRAMATE 100 MG PO TABS
ORAL_TABLET | ORAL | 5 refills | Status: DC
Start: 2017-05-05 — End: 2017-07-04

## 2017-05-05 NOTE — Telephone Encounter (Signed)
Medication resent

## 2017-05-05 NOTE — Addendum Note (Signed)
Addended by: Trudie Buckler on: 05/05/2017 11:39 AM   Modules accepted: Orders

## 2017-05-05 NOTE — Telephone Encounter (Signed)
Spoke to Pope at [facility] and provided verbal rx.

## 2017-05-05 NOTE — Telephone Encounter (Signed)
[name] with [facility] called to confirm topiramate (TOPAMAX) 100 MG tablet was never received when sent in on 05/02/17.  Please re send. FYI

## 2017-05-09 ENCOUNTER — Ambulatory Visit: Payer: Medicare HMO | Admitting: Neurology

## 2017-07-04 ENCOUNTER — Encounter: Payer: Self-pay | Admitting: Adult Health

## 2017-07-04 ENCOUNTER — Ambulatory Visit: Payer: Medicare HMO | Admitting: Adult Health

## 2017-07-04 ENCOUNTER — Ambulatory Visit (INDEPENDENT_AMBULATORY_CARE_PROVIDER_SITE_OTHER): Payer: Medicare HMO | Admitting: Adult Health

## 2017-07-04 VITALS — BP 124/80 | HR 82 | Wt 274.8 lb

## 2017-07-04 DIAGNOSIS — F1010 Alcohol abuse, uncomplicated: Secondary | ICD-10-CM

## 2017-07-04 DIAGNOSIS — G43009 Migraine without aura, not intractable, without status migrainosus: Secondary | ICD-10-CM

## 2017-07-04 MED ORDER — TOPIRAMATE 100 MG PO TABS: 100.0000 mg | ORAL_TABLET | Freq: Every day | ORAL | 5 refills | Status: DC

## 2017-07-04 NOTE — Progress Notes (Signed)
I have read the note, and I agree with the clinical assessment and plan.  Persais Ethridge KEITH   

## 2017-07-04 NOTE — Patient Instructions (Signed)
Your Plan:  Decrease Topamax 100 mg at bedtime Blood work today Should not drink alcohol and take topamax.  Speak with psychiatrist about resources to help you stop drinking alcohol.  If your symptoms worsen or you develop new symptoms please let us know.   Thank you for coming to see [place] at [facility]. I hope we have been able to provide you high quality care today.  You may receive a patient satisfaction survey over the next few weeks. We would appreciate your feedback and comments so that we may continue to improve ourselves and the health of our patients.

## 2017-07-04 NOTE — Progress Notes (Addendum)
PATIENT: [name] DOB: November 05, 1985  REASON FOR VISIT: follow up- headaches HISTORY FROM: patient  HISTORY OF PRESENT ILLNESS: Today 07/04/17 [name] is a 31 year old male with a history of bipolar disorder, schizoaffective disorder and headaches. He returns today for follow-up. At the last visit Topamax was increased to 150 mg daily. He reports that he is only been taking Topamax when he gets the headache. Reports that he has approximately 1-2 headaches a week. Headaches are always located in the frontal region radiating to the back of the head. He denies photophobia phonophobia. He states occasionally he will have nausea and vomiting. He states taking 150 grams daily cause drowsiness. The patient reports drinking alcohol nightly. He states that he is in counseling for alcoholism. He also sees his psychiatrist regularly. The patient states that Topamax does help with his headache not necessarily dizziness. He returns today for an evaluation.   HISTORY [name] is a 31 year old male with a history of bipolar disorder, schizoaffective disorder and headaches. He returns today for follow-up. At the last visit his Topamax was increased to 100 milligrams daily. He reports that this has been beneficial. His headaches will improve median daily to 3-4 times a week. His headaches typically occur across the forehead and in the temporal regions bilaterally. He does have photophobia, phonophobia, nausea and vomiting. His headaches typically last 3 or 4 hours. He usually has to lay down and use Tylenol or Advil to get relief. He reports that he is seeing a new [facility] at [facility]. He returns today for an evaluation.  HISTORY 07/05/16: [name] a 31 year old right-handed black male with a history of bipolar disorder, schizoaffective disorder. The patient has reported daily events of dizziness. He indicates that this is occurring mainly while he is at work and the episodes are associated  with shortness of breath and palpitations of the heart. The patient generally does much better when he is at home. 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 The patient is on Vistaril and alprazolam, but he takes these medications at night, not during the day. He indicates that his headaches are also daily in nature. He will sometimes miss work because the episodes of dizziness.   REVIEW OF SYSTEMS: Out of a complete 14 system review of symptoms, the patient complains only of the following symptoms, and all other reviewed systems are negative.  ALLERGIES: No Known Allergies  HOME MEDICATIONS: Outpatient Medications Prior to Visit  Medication Sig Dispense Refill  . acetaminophen (TYLENOL) 325 MG tablet Take 2 tablets (650 mg total) by mouth every 6 (six) hours as needed for mild pain.    [name] acetaminophen-codeine (TYLENOL #3) 300-30 MG tablet     . albuterol (PROVENTIL HFA;VENTOLIN HFA) 108 (90 BASE) MCG/ACT inhaler Inhale 2 puffs into the lungs every 6 (six) hours as needed for wheezing or shortness of breath.    . ALPRAZolam (XANAX) 1 MG tablet Take 1 mg by mouth.    . cetirizine (ZYRTEC) 10 MG tablet Take 1 tablet (10 mg total) by mouth daily.    . cloNIDine (CATAPRES) 0.1 MG tablet     . cyclobenzaprine (FLEXERIL) 10 MG tablet Take 10 mg by mouth 3 (three) times daily as needed for muscle spasms.    . diclofenac sodium (VOLTAREN) 1 % GEL     . dicyclomine (BENTYL) 10 MG capsule Take 1 capsule (10 mg total) by mouth 4 (four) times daily -  before meals and at bedtime.    [name]  HYDROcodone-acetaminophen (NORCO) 5-325 MG tablet Take by mouth.    . hydrOXYzine (ATARAX/VISTARIL) 25 MG tablet Take 25 mg by mouth 2 (two) times daily as needed for anxiety.    [name] lactulose (CHRONULAC) 10 GM/15ML solution Take 30 g by mouth 2 (two) times daily as needed for mild constipation.    [name] lamoTRIgine (LAMICTAL) 200 MG tablet Take 1 tablet (200 mg total) by mouth every evening. 30 tablet 0  . loratadine  (CLARITIN) 10 MG tablet Take by mouth.    . lurasidone 120 MG TABS Take 1 tablet (120 mg total) by mouth daily. 30 tablet 0  . meclizine (ANTIVERT) 25 MG tablet Take 25 mg by mouth 3 (three) times daily as needed for dizziness.    . meloxicam (MOBIC) 15 MG tablet Take by mouth.    . Multiple Vitamin (MULTIVITAMIN WITH MINERALS) TABS tablet Take 1 tablet by mouth daily.    . naproxen (NAPROSYN) 500 MG tablet Take by mouth.    . Nicotine 21-14-7 MG/24HR KIT Place 1 patch onto the skin daily.  0  . rizatriptan (MAXALT-MLT) 10 MG disintegrating tablet Take 10 mg by mouth as needed for migraine.     . topiramate (TOPAMAX) 100 MG tablet TAKE 1 & 1/2 TABLETS BY MOUTH AT BEDTIME 45 tablet 5  . traZODone (DESYREL) 300 MG tablet Take 1 tablet (300 mg total) by mouth at bedtime as needed for sleep. 30 tablet 0  . valACYclovir (VALTREX) 1000 MG tablet Take 1 tablet (1,000 mg total) by mouth daily.    [name] zolpidem (AMBIEN) 10 MG tablet Take 10 mg by mouth.    . Emtricitab-Rilpivir-Tenofov DF (COMPLERA) 200-25-300 MG TABS Take 1 tablet by mouth at bedtime. Take with food 30 tablet    No facility-administered medications prior to visit.     PAST MEDICAL HISTORY: Past Medical History:  Diagnosis Date  . Bipolar 1 disorder ([place])   . Depression   . Dizziness and giddiness 02/01/2016  . Herpes genitalia   . HIV disease ([place])   . Hypertension   . Migraine headache 02/01/2016  . PTSD (post-traumatic stress disorder)   . Schizoaffective disorder ([place])   . Seizures ([place])     PAST SURGICAL HISTORY: Past Surgical History:  Procedure Laterality Date  . BACK SURGERY    . HAND SURGERY      FAMILY HISTORY: Family History  Problem Relation Age of Onset  . Alcohol abuse Mother   . Schizophrenia Father   . Depression Father   . Alcohol abuse Father   . Alcohol abuse Paternal Uncle   . Alcohol abuse Paternal Uncle     SOCIAL HISTORY: Social History   Social History  . Marital status: Single     Spouse name: [name]  . Number of children: [name]  . Years of education: [name]   Occupational History  . [employer]    Social History Main Topics  . Smoking status: Current Every Day Smoker    Packs/day: 1.00    Types: Cigarettes  . Smokeless tobacco: Never Used  . Alcohol use 8.4 oz/week    14 Cans of beer per week     Comment: About 2 beers per day  . Drug use: No  . Sexual activity: Yes    Birth control/ protection: Condom   Other Topics Concern  . Not on file   Social History Narrative   Lives at home alone   Right-handed   Drinks 2-3 sodas per day  PHYSICAL EXAM  There were no vitals filed for this visit. There is no height or weight on file to calculate BMI.  Generalized: Well developed, in no acute distress   Neurological examination  Mentation: Alert oriented to time, place, history taking. Follows all commands speech and language fluent Cranial nerve II-XII: Pupils were equal round reactive to light. Extraocular movements were full, visual field were full on confrontational test. Facial sensation and strength were normal. Uvula tongue midline. Head turning and shoulder shrug  were normal and symmetric. Motor: The motor testing reveals 5 over 5 strength of all 4 extremities. Good symmetric motor tone is noted throughout.  Sensory: Sensory testing is intact to soft touch on all 4 extremities. No evidence of extinction is noted.  Coordination: Cerebellar testing reveals good finger-nose-finger and heel-to-shin bilaterally.  Gait and station: Gait is normal. Tandem gait is slightly unsteady. Romberg is negative. No drift is seen.  Reflexes: Deep tendon reflexes are symmetric and normal bilaterally.   DIAGNOSTIC DATA (LABS, IMAGING, TESTING) - I reviewed patient records, labs, notes, testing and imaging myself where available.  Lab Results  Component Value Date   WBC 6.5 01/11/2016   HGB 15.5 01/11/2016   HCT 46.5 01/11/2016   MCV 98.5 01/11/2016   PLT 205  01/11/2016      Component Value Date/Time   NA 136 01/11/2016 1714   K 3.8 01/11/2016 1714   CL 100 (L) 01/11/2016 1714   CO2 20 (L) 01/11/2016 1714   GLUCOSE 86 01/11/2016 1714   BUN 10 01/11/2016 1714   CREATININE 1.64 (H) 01/11/2016 1714   CALCIUM 9.8 01/11/2016 1714   PROT 9.0 (H) 05/12/2015 0117   ALBUMIN 4.4 05/12/2015 0117   AST 27 05/12/2015 0117   ALT 28 05/12/2015 0117   ALKPHOS 83 05/12/2015 0117   BILITOT 0.4 05/12/2015 0117   GFRNONAA 54 (L) 01/11/2016 1714   GFRAA >60 01/11/2016 1714     ASSESSMENT AND PLAN 31 y.o. year old male  has a past medical history of Bipolar 1 disorder ([place]); Depression; Dizziness and giddiness (02/01/2016); Herpes genitalia; HIV disease ([place]); Hypertension; Migraine headache (02/01/2016); PTSD (post-traumatic stress disorder); Schizoaffective disorder ([place]); and Seizures ([place]). here with:  1. Headaches  Patient has not been taking Topamax properly. I advised that he should be taking nightly. He voiced understanding. He had a hard time tolerating 150 mg we will decrease his dose 100 mg at bedtime. Patient is advised that he should speak to his psychiatrist about possible resources to help with alcohol abuse. Patient is advised that he should not drink alcohol and take topamax. I will check blood work today. He will follow-up in 6 months or sooner if needed.     Ward Givens, MSN, NP-C 07/04/2017, 3:18 PM [facility] [address] [place], [place] [address] [phone]

## 2017-07-05 ENCOUNTER — Telehealth: Payer: Self-pay | Admitting: *Deleted

## 2017-07-05 LAB — COMPREHENSIVE METABOLIC PANEL
ALT: 43 IU/L (ref 0–44)
AST: 49 IU/L — ABNORMAL HIGH (ref 0–40)
Albumin/Globulin Ratio: 1.3 (ref 1.2–2.2)
Albumin: 4.2 g/dL (ref 3.5–5.5)
Alkaline Phosphatase: 72 IU/L (ref 39–117)
BUN/Creatinine Ratio: 6 — ABNORMAL LOW (ref 9–20)
BUN: 7 mg/dL (ref 6–20)
Bilirubin Total: 0.3 mg/dL (ref 0.0–1.2)
CO2: 26 mmol/L (ref 20–29)
Calcium: 9.1 mg/dL (ref 8.7–10.2)
Chloride: 95 mmol/L — ABNORMAL LOW (ref 96–106)
Creatinine, Ser: 1.25 mg/dL (ref 0.76–1.27)
GFR calc Af Amer: 87 mL/min/{1.73_m2} (ref >59)
GFR calc non Af Amer: 75 mL/min/{1.73_m2} (ref >59)
Globulin, Total: 3.2 g/dL (ref 1.5–4.5)
Glucose: 85 mg/dL (ref 65–99)
Potassium: 3.9 mmol/L (ref 3.5–5.2)
Sodium: 138 mmol/L (ref 134–144)
Total Protein: 7.4 g/dL (ref 6.0–8.5)

## 2017-07-05 NOTE — Telephone Encounter (Signed)
LVM informing patient his labs showed AST (liver enzyme) slightly elevated.  Otherwise his labs are okay. Advised him a copy was faxed to his PCP, Dr Jeanie Cooks. Left number for any questions.

## 2017-07-06 NOTE — Telephone Encounter (Signed)
Patient called office returning RN's call about lab results.  He has more questions about the AST being slightly elevated and would like to know what this means.  Please call

## 2017-07-06 NOTE — Telephone Encounter (Signed)
Spoke with patient who asked what exactly his lab result meant. Advised him that per NP, the mildly elevated liver enzyme may be related to his history of alcohol use. Advised him that his PCP can follow up with labs as needed. Advised Dr Eugenie Birks may also when he sees him for follow up next year.  Patient asked specifically what labs were drawn; advised a CBC and CMP. Explained the CMP checks kidney function labs, blood sugar, sodium, potassium and other electrolytes in blood. Patient verbalized understanding, had no further questions.

## 2018-01-04 ENCOUNTER — Encounter: Payer: Self-pay | Admitting: Neurology

## 2018-01-04 ENCOUNTER — Other Ambulatory Visit: Payer: Self-pay

## 2018-01-04 ENCOUNTER — Ambulatory Visit (INDEPENDENT_AMBULATORY_CARE_PROVIDER_SITE_OTHER): Payer: Medicare HMO | Admitting: Neurology

## 2018-01-04 VITALS — BP 125/72 | HR 109 | Ht 74.0 in | Wt 271.5 lb

## 2018-01-04 DIAGNOSIS — G43719 Chronic migraine without aura, intractable, without status migrainosus: Secondary | ICD-10-CM | POA: Diagnosis not present

## 2018-01-04 DIAGNOSIS — R42 Dizziness and giddiness: Secondary | ICD-10-CM | POA: Diagnosis not present

## 2018-01-04 MED ORDER — TOPIRAMATE 25 MG PO TABS: 75.0000 mg | ORAL_TABLET | Freq: Every day | ORAL | 1 refills | Status: DC

## 2018-01-04 MED ORDER — GABAPENTIN 300 MG PO CAPS: 300.0000 mg | ORAL_CAPSULE | Freq: Two times a day (BID) | ORAL | 3 refills | Status: DC

## 2018-01-04 NOTE — Patient Instructions (Signed)
   We will reduce the Topamax to 75 mg at night.  We will start gabapentin for the headache. Start 300 mg twice a day.  Neurontin (gabapentin) may result in drowsiness, ankle swelling, gait instability, or possibly dizziness. Please contact our office if significant side effects occur with this medication.

## 2018-01-04 NOTE — Progress Notes (Signed)
Reason for visit: Migraine headache, dizziness  [name] is an 32 y.o. male  History of present illness:  [name] is a 32 year old right-handed black male with a history of schizoaffective disorder.  The patient has a long-standing history of dizziness that has continued, he believes that the dizziness is generally when he stands up, it gets better when he sits down or lies down.  He takes clonidine in the evenings and the dizziness is usually worse in the morning.  The patient has not had any blackout episodes, he still has 2 or 3 headaches a week.  The patient indicates that headaches may last 30-40 minutes.  He has a lot of tingling sensations on his hands and feet on the Topamax, this keeps him awake at night.  The patient does note some photophobia with the headache, he denies significant nausea or vomiting.  He follows up in this office for further evaluation.  Past Medical History:  Diagnosis Date  . Bipolar 1 disorder ([place])   . Depression   . Dizziness and giddiness 02/01/2016  . Herpes genitalia   . HIV disease ([place])   . Hypertension   . Migraine headache 02/01/2016  . PTSD (post-traumatic stress disorder)   . Schizoaffective disorder ([place])   . Seizures ([place])     Past Surgical History:  Procedure Laterality Date  . BACK SURGERY    . HAND SURGERY      Family History  Problem Relation Age of Onset  . Alcohol abuse Mother   . Schizophrenia Father   . Depression Father   . Alcohol abuse Father   . Alcohol abuse Paternal Uncle   . Alcohol abuse Paternal Uncle     Social history:  reports that he has been smoking cigarettes.  He has been smoking about 1.00 pack per day. He has never used smokeless tobacco. He reports that he drinks about 8.4 oz of alcohol per week. He reports that he does not use drugs.   No Known Allergies  Medications:  Prior to Admission medications   Medication Sig Start Date End Date Taking? Authorizing Provider  acetaminophen  (TYLENOL) 325 MG tablet Take 2 tablets (650 mg total) by mouth every 6 (six) hours as needed for mild pain. 05/15/15  Yes Niel Hummer, NP  acetaminophen-codeine (TYLENOL #3) 300-30 MG tablet  01/16/16  Yes [provider]  albuterol (PROVENTIL HFA;VENTOLIN HFA) 108 (90 BASE) MCG/ACT inhaler Inhale 2 puffs into the lungs every 6 (six) hours as needed for wheezing or shortness of breath.   Yes [provider]  ALPRAZolam Duanne Moron) 1 MG tablet Take 1 mg by mouth.   Yes [provider]  bictegravir-emtricitabine-tenofovir AF (BIKTARVY) 50-200-25 MG TABS tablet Take by mouth. 02/24/17  Yes [provider]  cetirizine (ZYRTEC) 10 MG tablet Take 1 tablet (10 mg total) by mouth daily. 05/15/15  Yes Niel Hummer, NP  cloNIDine (CATAPRES) 0.1 MG tablet  07/14/15  Yes [provider]  cyclobenzaprine (FLEXERIL) 10 MG tablet Take 10 mg by mouth 3 (three) times daily as needed for muscle spasms.   Yes [provider]  diclofenac sodium (VOLTAREN) 1 % GEL  02/17/14  Yes [provider]  dicyclomine (BENTYL) 10 MG capsule Take 1 capsule (10 mg total) by mouth 4 (four) times daily -  before meals and at bedtime. 05/15/15  Yes Niel Hummer, NP  HYDROcodone-acetaminophen (NORCO) 5-325 MG tablet Take by mouth. 03/26/14  Yes [provider]  hydrOXYzine (ATARAX/VISTARIL) 25 MG tablet Take 25 mg by mouth 2 (two) times daily as needed for anxiety.   Yes [provider]  lactulose (CHRONULAC) 10 GM/15ML solution Take 30 g by mouth 2 (two) times daily as needed for mild constipation.   Yes [provider]  lamoTRIgine (LAMICTAL) 200 MG tablet Take 1 tablet (200 mg total) by mouth every evening. 05/15/15  Yes Niel Hummer, NP  loratadine (CLARITIN) 10 MG tablet Take by mouth.   Yes [provider]  lurasidone 120 MG TABS Take 1 tablet (120 mg total) by mouth daily. 05/15/15  Yes Niel Hummer, NP  meclizine (ANTIVERT) 25 MG tablet Take  25 mg by mouth 3 (three) times daily as needed for dizziness.   Yes [provider]  meloxicam (MOBIC) 15 MG tablet Take by mouth.   Yes [provider]  Multiple Vitamin (MULTIVITAMIN WITH MINERALS) TABS tablet Take 1 tablet by mouth daily. 05/15/15  Yes Niel Hummer, NP  naltrexone (DEPADE) 50 MG tablet Take 50 mg by mouth daily. 06/20/17  Yes [provider]  naproxen (NAPROSYN) 500 MG tablet Take by mouth.   Yes [provider]  Nicotine 21-14-7 MG/24HR KIT Place 1 patch onto the skin daily. 05/15/15  Yes Niel Hummer, NP  ondansetron (ZOFRAN) 8 MG tablet Take 8 mg by mouth every 8 (eight) hours as needed. 06/15/17  Yes [provider]  ondansetron (ZOFRAN-ODT) 4 MG disintegrating tablet DISSOLVE ONE TABLET IN MOUTH THREE TIMES A DAY AS NEEDED 06/08/17  Yes [provider]  PARoxetine (PAXIL) 40 MG tablet Take 40 mg by mouth at bedtime. 06/20/17  Yes [provider]  promethazine (PHENERGAN) 25 MG tablet TAKE ONE TABLET BY MOUTH THREE TIMES A DAY. AS NEEDED FOR NAUSEA 06/20/17  Yes [provider]  rizatriptan (MAXALT-MLT) 10 MG disintegrating tablet Take 10 mg by mouth as needed for migraine.  02/21/14  Yes [provider]  traZODone (DESYREL) 300 MG tablet Take 1 tablet (300 mg total) by mouth at bedtime as needed for sleep. 05/15/15  Yes Niel Hummer, NP  valACYclovir (VALTREX) 1000 MG tablet Take 1 tablet (1,000 mg total) by mouth daily. 05/15/15  Yes Niel Hummer, NP  zolpidem (AMBIEN) 10 MG tablet Take 10 mg by mouth.   Yes [provider]  gabapentin (NEURONTIN) 300 MG capsule Take 1 capsule (300 mg total) by mouth 2 (two) times daily. 01/04/18   Kathrynn Ducking, MD  topiramate (TOPAMAX) 25 MG tablet Take 3 tablets (75 mg total) by mouth at bedtime. 01/04/18   Kathrynn Ducking, MD    ROS:  Out of a complete 14 system review of symptoms, the patient complains only of the following symptoms, and all other  reviewed systems are negative.  Fevers Difficulty swallowing Double vision Cough, wheezing Leg swelling Restless legs, insomnia Back pain Behavior problem, confusion Depression, suicidal thoughts  Blood pressure 125/72, pulse (!) 109, height _0  (1.88 m), weight 271 lb 8 oz (123.2 kg).   Blood pressure, right arm, sitting is 138/88.  Blood pressure, right arm, standing is 122/84.  Physical Exam  General: The patient is alert and cooperative at the time of the examination.  Patient is moderately obese.  Skin: No significant peripheral edema is noted.   Neurologic Exam  Mental status: The patient is alert and oriented x 3 at the time of the examination. The patient has apparent normal recent and remote memory, with an  apparently normal attention span and concentration ability.   Cranial nerves: Facial symmetry is present. Speech is normal, no aphasia or dysarthria is noted. Extraocular movements are full. Visual fields are full.  Motor: The patient has good strength in all 4 extremities.  Sensory examination: Soft touch sensation is symmetric on the face, arms, and legs.  Coordination: The patient has good finger-nose-finger and heel-to-shin bilaterally.  Gait and station: The patient has a normal gait. Tandem gait is normal. Romberg is negative. No drift is seen.  Reflexes: Deep tendon reflexes are symmetric.   Assessment/Plan:  1.  Intractable migraine headache  2.  Chronic dizziness  The patient is on a multitude of medications that could potentially result in dizziness, he takes clonidine in the evenings and has the worst dizziness in the morning that seems to be positional, coming on when he stands up.  He does not appear to be significantly orthostatic today.  The patient is not tolerating the Topamax well secondary to tingling sensations.  He will be cut back to 75 mg at night.  The patient will be placed on gabapentin for his headache taking 300 mg twice daily,  he will call for any dose adjustments.  He will follow-up in 6 months.  [name] MD 01/04/2018 1:43 PM  [facility] [address] [place], [place] [address]  Phone [phone] Fax [phone]

## 2018-04-22 ENCOUNTER — Encounter (HOSPITAL_COMMUNITY): Payer: Self-pay | Admitting: Emergency Medicine

## 2018-04-22 ENCOUNTER — Emergency Department (HOSPITAL_COMMUNITY)
Admission: EM | Admit: 2018-04-22 | Discharge: 2018-04-22 | Disposition: A | Discharge status: Home / Self Care | Payer: Medicare HMO | Attending: Emergency Medicine | Admitting: Emergency Medicine

## 2018-04-22 DIAGNOSIS — F1994 Other psychoactive substance use, unspecified with psychoactive substance-induced mood disorder: Secondary | ICD-10-CM | POA: Insufficient documentation

## 2018-04-22 DIAGNOSIS — F17210 Nicotine dependence, cigarettes, uncomplicated: Secondary | ICD-10-CM | POA: Insufficient documentation

## 2018-04-22 DIAGNOSIS — B20 Human immunodeficiency virus [HIV] disease: Secondary | ICD-10-CM | POA: Diagnosis not present

## 2018-04-22 DIAGNOSIS — Z79899 Other long term (current) drug therapy: Secondary | ICD-10-CM | POA: Diagnosis not present

## 2018-04-22 DIAGNOSIS — I10 Essential (primary) hypertension: Secondary | ICD-10-CM | POA: Diagnosis not present

## 2018-04-22 DIAGNOSIS — F10220 Alcohol dependence with intoxication, uncomplicated: Secondary | ICD-10-CM | POA: Insufficient documentation

## 2018-04-22 DIAGNOSIS — Y906 Blood alcohol level of 120-199 mg/100 ml: Secondary | ICD-10-CM | POA: Diagnosis not present

## 2018-04-22 DIAGNOSIS — F10920 Alcohol use, unspecified with intoxication, uncomplicated: Secondary | ICD-10-CM

## 2018-04-22 LAB — RAPID URINE DRUG SCREEN, HOSP PERFORMED
Amphetamines: NOT DETECTED
Benzodiazepines: POSITIVE — AB
Cocaine: NOT DETECTED
Opiates: NOT DETECTED
Tetrahydrocannabinol: NOT DETECTED

## 2018-04-22 LAB — CBC
HCT: 42.5 % (ref 39.0–52.0)
Hemoglobin: 13.8 g/dL (ref 13.0–17.0)
MCH: 32.6 pg (ref 26.0–34.0)
MCHC: 32.5 g/dL (ref 30.0–36.0)
MCV: 100.5 fL — ABNORMAL HIGH (ref 78.0–100.0)
Platelets: 163 10*3/uL (ref 150–400)
RBC: 4.23 MIL/uL (ref 4.22–5.81)
RDW: 14.9 % (ref 11.5–15.5)
WBC: 6.5 10*3/uL (ref 4.0–10.5)

## 2018-04-22 LAB — COMPREHENSIVE METABOLIC PANEL
ALT: 40 U/L (ref 0–44)
AST: 47 U/L — ABNORMAL HIGH (ref 15–41)
Albumin: 3.7 g/dL (ref 3.5–5.0)
Alkaline Phosphatase: 65 U/L (ref 38–126)
Anion gap: 13 (ref 5–15)
BUN: 7 mg/dL (ref 6–20)
CO2: 22 mmol/L (ref 22–32)
Calcium: 9.1 mg/dL (ref 8.9–10.3)
Chloride: 104 mmol/L (ref 98–111)
Creatinine, Ser: 1.15 mg/dL (ref 0.61–1.24)
GFR calc Af Amer: >60 mL/min (ref >60)
GFR calc non Af Amer: >60 mL/min (ref >60)
Glucose, Bld: 120 mg/dL — ABNORMAL HIGH (ref 70–99)
Potassium: 3.6 mmol/L (ref 3.5–5.1)
Sodium: 139 mmol/L (ref 135–145)
Total Bilirubin: 0.3 mg/dL (ref 0.3–1.2)
Total Protein: 8.6 g/dL — ABNORMAL HIGH (ref 6.5–8.1)

## 2018-04-22 LAB — ETHANOL: Alcohol, Ethyl (B): 192 mg/dL — ABNORMAL HIGH (ref <10)

## 2018-04-22 LAB — ACETAMINOPHEN LEVEL: Acetaminophen (Tylenol), Serum: <10 ug/mL — ABNORMAL LOW (ref 10–30)

## 2018-04-22 LAB — SALICYLATE LEVEL: Salicylate Lvl: <7 mg/dL (ref 2.8–30.0)

## 2018-04-22 NOTE — ED Notes (Signed)
Bed: [facility] Expected date: 04/22/18 Expected time: 12:32 PM Means of arrival:  Comments: WTR6

## 2018-04-22 NOTE — ED Notes (Signed)
Wanded by security

## 2018-04-22 NOTE — ED Notes (Signed)
Bed: WTR6 Expected date:  Expected time:  Means of arrival:  Comments: 

## 2018-04-22 NOTE — ED Notes (Signed)
Pt drowsy upon arrival to unit and says he took 300 mg trazodone earlier today. He says this is his usual dose. NP notified. Pt says he last had SI yesterday with no plan. Denies HI. Endorse AVH -- seeing/hearing his friend telling him to calm down. Pt is cooperative and pleasant. Given water, blankets, and lunch. Will monitor for needs/safety.

## 2018-04-22 NOTE — Discharge Instructions (Addendum)
It was our pleasure to provide your ER care today - we hope that you feel better.  Rest. Drink plenty of fluids.  Avoid alcohol use - follow up with AA, and use resource guide provided for additional community resources.   For mental health issues and/or crisis, you may go directly to [facility].  Also follow up with primary care doctor in the coming week.   Return to ER if worse, new symptoms, new or severe pain, trouble breathing, or other emergency.

## 2018-04-22 NOTE — ED Notes (Signed)
Dr Ashok Cordia ok to move triage 6 now

## 2018-04-22 NOTE — ED Provider Notes (Signed)
[facility] DEPT Provider Note   CSN: [id number] Arrival date & time: 04/22/18  0909     History   Chief Complaint Chief Complaint  Patient presents with  . Alcohol Intoxication  . Suicidal    HPI Gerson L Duffus is a 32 y.o. male.  Patient c/o etoh intoxication. Drank heavily last night/early this AM. Unable to quantify amount. Report of SI earlier, but patient currently denies any thoughts of harm to self or others. States when he drinks, he gets down on himself sometimes.  Denies overdose or attempt at hurting self. Denies hx seizures or dts. States physical health at baseline. Normal appetite. Denies trauma or fall. Denies pain or other physical c/o currently. States would like something to eat/drink.   The history is provided by the patient.  Alcohol Intoxication  Pertinent negatives include no chest pain, no abdominal pain, no headaches and no shortness of breath.    Past Medical History:  Diagnosis Date  . Bipolar 1 disorder ([place])   . Depression   . Dizziness and giddiness 02/01/2016  . Herpes genitalia   . HIV disease ([place])   . Hypertension   . Migraine headache 02/01/2016  . PTSD (post-traumatic stress disorder)   . Schizoaffective disorder ([place])   . Seizures [facility])     Patient Active Problem List   Diagnosis Date Noted  . Dizziness and giddiness 02/01/2016  . Migraine headache 02/01/2016  . Schizophrenia, schizo-affective type, depressed ([place]) 05/13/2015  . Severe alcohol dependence ([place]) 05/13/2015  . Suicidal ideation 01/12/2014    Past Surgical History:  Procedure Laterality Date  . BACK SURGERY    . HAND SURGERY          Home Medications    Prior to Admission medications   Medication Sig Start Date End Date Taking? Authorizing Provider  acetaminophen (TYLENOL) 325 MG tablet Take 2 tablets (650 mg total) by mouth every 6 (six) hours as needed for mild pain. 05/15/15   Niel Hummer, NP  acetaminophen-codeine  (TYLENOL #3) 300-30 MG tablet  01/16/16   [provider]  albuterol (PROVENTIL HFA;VENTOLIN HFA) 108 (90 BASE) MCG/ACT inhaler Inhale 2 puffs into the lungs every 6 (six) hours as needed for wheezing or shortness of breath.    [provider]  ALPRAZolam Duanne Moron) 1 MG tablet Take 1 mg by mouth.    [provider]  bictegravir-emtricitabine-tenofovir AF (BIKTARVY) 50-200-25 MG TABS tablet Take by mouth. 02/24/17   [provider]  cetirizine (ZYRTEC) 10 MG tablet Take 1 tablet (10 mg total) by mouth daily. 05/15/15   Niel Hummer, NP  cloNIDine (CATAPRES) 0.1 MG tablet  07/14/15   [provider]  cyclobenzaprine (FLEXERIL) 10 MG tablet Take 10 mg by mouth 3 (three) times daily as needed for muscle spasms.    [provider]  diclofenac sodium (VOLTAREN) 1 % GEL  02/17/14   [provider]  dicyclomine (BENTYL) 10 MG capsule Take 1 capsule (10 mg total) by mouth 4 (four) times daily -  before meals and at bedtime. 05/15/15   Niel Hummer, NP  gabapentin (NEURONTIN) 300 MG capsule Take 1 capsule (300 mg total) by mouth 2 (two) times daily. 01/04/18   Kathrynn Ducking, MD  HYDROcodone-acetaminophen (NORCO) 5-325 MG tablet Take by mouth. 03/26/14   [provider]  hydrOXYzine (ATARAX/VISTARIL) 25 MG tablet Take 25 mg by mouth 2 (two) times daily as needed for anxiety.    [provider]  lactulose (CHRONULAC) 10 GM/15ML solution Take 30 g by mouth 2 (two) times daily as needed for mild constipation.    [provider]  lamoTRIgine (LAMICTAL) 200 MG tablet Take 1 tablet (200 mg total) by mouth every evening. 05/15/15   Niel Hummer, NP  loratadine (CLARITIN) 10 MG tablet Take by mouth.    [provider]  lurasidone 120 MG TABS Take 1 tablet (120 mg total) by mouth daily. 05/15/15   Niel Hummer, NP  meclizine (ANTIVERT) 25 MG tablet Take 25 mg by mouth 3 (three) times daily as needed for dizziness.    [provider]  meloxicam (MOBIC) 15 MG tablet Take by mouth.    [provider]  Multiple Vitamin (MULTIVITAMIN WITH MINERALS) TABS tablet Take 1 tablet by mouth daily. 05/15/15   Niel Hummer, NP  naltrexone (DEPADE) 50 MG tablet Take 50 mg by mouth daily. 06/20/17   [provider]  naproxen (NAPROSYN) 500 MG tablet Take by mouth.    [provider]  Nicotine 21-14-7 MG/24HR KIT Place 1 patch onto the skin daily. 05/15/15   Niel Hummer, NP  ondansetron (ZOFRAN) 8 MG tablet Take 8 mg by mouth every 8 (eight) hours as needed. 06/15/17   [provider]  ondansetron (ZOFRAN-ODT) 4 MG disintegrating tablet DISSOLVE ONE TABLET IN MOUTH THREE TIMES A DAY AS NEEDED 06/08/17   [provider]  PARoxetine (PAXIL) 40 MG tablet Take 40 mg by mouth at bedtime. 06/20/17   [provider]  promethazine (PHENERGAN) 25 MG tablet TAKE ONE TABLET BY MOUTH THREE TIMES A DAY. AS NEEDED FOR NAUSEA 06/20/17   [provider]  rizatriptan (MAXALT-MLT) 10 MG disintegrating tablet Take 10 mg by mouth as needed for migraine.  02/21/14   [provider]  topiramate (TOPAMAX) 25 MG tablet Take 3 tablets (75 mg total) by mouth at bedtime. 01/04/18   Kathrynn Ducking, MD  traZODone (DESYREL) 300 MG tablet Take 1 tablet (300 mg total) by mouth at bedtime as needed for sleep. 05/15/15   Niel Hummer, NP  valACYclovir (VALTREX) 1000 MG tablet Take 1 tablet (1,000 mg total) by mouth daily. 05/15/15   Niel Hummer, NP  zolpidem (AMBIEN) 10 MG tablet Take 10 mg by mouth.    [provider]    Family History Family History  Problem Relation Age of Onset  . Alcohol abuse Mother   . Schizophrenia Father   . Depression Father   . Alcohol abuse Father   . Alcohol abuse Paternal Uncle   . Alcohol abuse Paternal Uncle     Social History Social History   Tobacco Use  . Smoking status: Current Every Day Smoker    Packs/day: 1.00    Types: Cigarettes    . Smokeless tobacco: Never Used  Substance Use Topics  . Alcohol use: Yes    Alcohol/week: 8.4 oz    Types: 14 Cans of beer per week    Comment: About 2 beers per day  . Drug use: No     Allergies   Patient has no known allergies.   Review of Systems Review of Systems  Constitutional: Negative for fever.  HENT: Negative for sore throat.   Eyes: Negative for redness.  Respiratory: Negative for shortness of breath.   Cardiovascular: Negative for chest pain.  Gastrointestinal: Negative for abdominal pain.  Genitourinary: Negative for flank pain.  Musculoskeletal: Negative for back pain and neck pain.  Skin:  Negative for rash.  Neurological: Negative for headaches.  Hematological: Does not bruise/bleed easily.  Psychiatric/Behavioral: Positive for dysphoric mood.     Physical Exam Updated Vital Signs BP 130/76 (BP Location: Left Arm)   Pulse (!) 111   Temp 98.6 F (37 C) (Oral)   Resp 16   Ht 1.905 m (6' 3" )   Wt 127 kg (280 lb)   SpO2 92%   BMI 35.00 kg/m   Physical Exam  Constitutional: He appears well-developed and well-nourished.  HENT:  Head: Atraumatic.  Mouth/Throat: Oropharynx is clear and moist.  Eyes: Pupils are equal, round, and reactive to light. Conjunctivae are normal. No scleral icterus.  Neck: Neck supple. No tracheal deviation present.  Cardiovascular: Normal rate, regular rhythm, normal heart sounds and intact distal pulses.  Pulmonary/Chest: Effort normal and breath sounds normal. No accessory muscle usage. No respiratory distress.  Abdominal: Soft. He exhibits no distension. There is no tenderness.  Musculoskeletal: He exhibits no edema.  Neurological: He is alert.  Speech clear. Ambulates w steady gait.   Skin: Skin is warm and dry. He is not diaphoretic.  Psychiatric: He has a normal mood and affect.  Nursing note and vitals reviewed.    ED Treatments / Results  Labs (all labs ordered are listed, but only abnormal results are  displayed) Results for orders placed or performed during the hospital encounter of 04/22/18  Comprehensive metabolic panel  Result Value Ref Range   Sodium 139 135 - 145 mmol/L   Potassium 3.6 3.5 - 5.1 mmol/L   Chloride 104 98 - 111 mmol/L   CO2 22 22 - 32 mmol/L   Glucose, Bld 120 (H) 70 - 99 mg/dL   BUN 7 6 - 20 mg/dL   Creatinine, Ser 1.15 0.61 - 1.24 mg/dL   Calcium 9.1 8.9 - 10.3 mg/dL   Total Protein 8.6 (H) 6.5 - 8.1 g/dL   Albumin 3.7 3.5 - 5.0 g/dL   AST 47 (H) 15 - 41 U/L   ALT 40 0 - 44 U/L   Alkaline Phosphatase 65 38 - 126 U/L   Total Bilirubin 0.3 0.3 - 1.2 mg/dL   GFR calc non Af Amer >60 >60 mL/min   GFR calc Af Amer >60 >60 mL/min   Anion gap 13 5 - 15  Ethanol  Result Value Ref Range   Alcohol, Ethyl (B) 192 (H) <27 mg/dL  Salicylate level  Result Value Ref Range   Salicylate Lvl <5.1 2.8 - 30.0 mg/dL  Acetaminophen level  Result Value Ref Range   Acetaminophen (Tylenol), Serum <10 (L) 10 - 30 ug/mL  cbc  Result Value Ref Range   WBC 6.5 4.0 - 10.5 K/uL   RBC 4.23 4.22 - 5.81 MIL/uL   Hemoglobin 13.8 13.0 - 17.0 g/dL   HCT 42.5 39.0 - 52.0 %   MCV 100.5 (H) 78.0 - 100.0 fL   MCH 32.6 26.0 - 34.0 pg   MCHC 32.5 30.0 - 36.0 g/dL   RDW 14.9 11.5 - 15.5 %   Platelets 163 150 - 400 K/uL  Rapid urine drug screen (hospital performed)  Result Value Ref Range   Opiates NONE DETECTED NONE DETECTED   Cocaine NONE DETECTED NONE DETECTED   Benzodiazepines POSITIVE (A) NONE DETECTED   Amphetamines NONE DETECTED NONE DETECTED   Tetrahydrocannabinol NONE DETECTED NONE DETECTED   Barbiturates (A) NONE DETECTED    Result not available. Reagent lot number recalled by manufacturer.   EKG None  Radiology No results  found.  Procedures Procedures (including critical care time)  Medications Ordered in ED Medications - No data to display   Initial Impression / Assessment and Plan / ED Course  I have reviewed the triage vital signs and the nursing  notes.  Pertinent labs & imaging results that were available during my care of the patient were reviewed by me and considered in my medical decision making (see chart for details).  Labs sent from triage per standing orders.   Pt asking for something to eat/drink - provided to him.   Reviewed nursing notes and prior charts for additional history.   Pt ambulatory w steady gait. Tolerating diet/fluids well.   Pt is declining thoughts of harm to self or others, and does not appear acutely psychotic.  Pt currently appears stable for d/c.    Final Clinical Impressions(s) / ED Diagnoses   Final diagnoses:  None    ED Discharge Orders    None       Lajean Saver, MD 04/22/18 1347

## 2018-04-22 NOTE — ED Triage Notes (Signed)
Patient here from home with complaints of ETOH, "I drink a lot", and suicidal ideation. Hx of same. Plans to overdose.

## 2018-04-22 NOTE — ED Notes (Signed)
Patient was discharged per order. AVS was reviewed with pt. Pt was given an opportunity to ask questions and verbalized understanding of all discharge paperwork. Belongings were returned, and patient signed for receipt. Patient verbalized readiness for discharge and appeared in no acute distress when escorted to lobby with bus pass.

## 2018-05-09 ENCOUNTER — Other Ambulatory Visit: Payer: Self-pay | Admitting: Neurology

## 2018-06-26 ENCOUNTER — Other Ambulatory Visit: Payer: Self-pay | Admitting: Gastroenterology

## 2018-06-26 DIAGNOSIS — R112 Nausea with vomiting, unspecified: Secondary | ICD-10-CM

## 2018-06-26 DIAGNOSIS — R748 Abnormal levels of other serum enzymes: Secondary | ICD-10-CM

## 2018-07-05 ENCOUNTER — Other Ambulatory Visit: Payer: Self-pay | Admitting: Neurology

## 2018-07-06 DIAGNOSIS — Z0289 Encounter for other administrative examinations: Secondary | ICD-10-CM

## 2018-07-10 ENCOUNTER — Other Ambulatory Visit: Payer: Self-pay

## 2018-07-17 ENCOUNTER — Ambulatory Visit (INDEPENDENT_AMBULATORY_CARE_PROVIDER_SITE_OTHER): Payer: Medicare HMO | Admitting: Adult Health

## 2018-07-17 ENCOUNTER — Encounter: Payer: Self-pay | Admitting: Adult Health

## 2018-07-17 ENCOUNTER — Other Ambulatory Visit: Payer: Self-pay | Admitting: Gastroenterology

## 2018-07-17 VITALS — BP 126/84 | HR 80 | Ht 74.0 in | Wt 292.0 lb

## 2018-07-17 DIAGNOSIS — F1010 Alcohol abuse, uncomplicated: Secondary | ICD-10-CM | POA: Diagnosis not present

## 2018-07-17 DIAGNOSIS — R112 Nausea with vomiting, unspecified: Secondary | ICD-10-CM

## 2018-07-17 DIAGNOSIS — R42 Dizziness and giddiness: Secondary | ICD-10-CM

## 2018-07-17 DIAGNOSIS — R945 Abnormal results of liver function studies: Secondary | ICD-10-CM

## 2018-07-17 DIAGNOSIS — G43719 Chronic migraine without aura, intractable, without status migrainosus: Secondary | ICD-10-CM | POA: Diagnosis not present

## 2018-07-17 DIAGNOSIS — R7989 Other specified abnormal findings of blood chemistry: Secondary | ICD-10-CM

## 2018-07-17 NOTE — Progress Notes (Signed)
PATIENT: Esmeralda Arthur DOB: 1986/02/13  REASON FOR VISIT: follow up HISTORY FROM: patient  HISTORY OF PRESENT ILLNESS: Today 07/17/18:  Mr. Venuto is a 32 year old male with a history of daily headaches and dizziness.  He returns today for follow-up.  He states that he continues on Topamax 75 mg at bedtime as well as gabapentin.  He reports that he does not take his medication consistently.  He states that he continues to have daily headaches as well as dizziness.  The patient is on a multitude of medications.  He smells of alcohol today.  I asked if he has been drinking.  Initially he said no.  Then he stated that he only had one beer approximately 2 to 3 hours ago.  He does admit that he typically drinks alcohol daily.  He returns today for evaluation.  HISTORYMr. Toso is a 32 year old right-handed black male with a history of schizoaffective disorder.  The patient has a long-standing history of dizziness that has continued, he believes that the dizziness is generally when he stands up, it gets better when he sits down or lies down.  He takes clonidine in the evenings and the dizziness is usually worse in the morning.  The patient has not had any blackout episodes, he still has 2 or 3 headaches a week.  The patient indicates that headaches may last 30-40 minutes.  He has a lot of tingling sensations on his hands and feet on the Topamax, this keeps him awake at night.  The patient does note some photophobia with the headache, he denies significant nausea or vomiting.  He follows up in this office for further evaluation.   REVIEW OF SYSTEMS: Out of a complete 14 system review of symptoms, the patient complains only of the following symptoms, and all other reviewed systems are negative.  Chills, eye itching, eye redness, cough, wheezing, chest pain, joint pain, joint swelling, back pain  ALLERGIES: No Known Allergies  HOME MEDICATIONS: Outpatient Medications Prior to Visit  Medication  Sig Dispense Refill  . acetaminophen (TYLENOL) 325 MG tablet Take 2 tablets (650 mg total) by mouth every 6 (six) hours as needed for mild pain.    [name] acetaminophen-codeine (TYLENOL #3) 300-30 MG tablet     . albuterol (PROVENTIL HFA;VENTOLIN HFA) 108 (90 BASE) MCG/ACT inhaler Inhale 2 puffs into the lungs every 6 (six) hours as needed for wheezing or shortness of breath.    . ALPRAZolam (XANAX) 1 MG tablet Take 1 mg by mouth.    . bictegravir-emtricitabine-tenofovir AF (BIKTARVY) 50-200-25 MG TABS tablet Take by mouth.    . cetirizine (ZYRTEC) 10 MG tablet Take 1 tablet (10 mg total) by mouth daily.    . cloNIDine (CATAPRES) 0.1 MG tablet     . cyclobenzaprine (FLEXERIL) 10 MG tablet Take 10 mg by mouth 3 (three) times daily as needed for muscle spasms.    . diclofenac sodium (VOLTAREN) 1 % GEL     . dicyclomine (BENTYL) 10 MG capsule Take 1 capsule (10 mg total) by mouth 4 (four) times daily -  before meals and at bedtime.    . gabapentin (NEURONTIN) 300 MG capsule TAKE 1 CAPSULE (300 MG TOTAL) BY MOUTH 2 (TWO) TIMES DAILY. 60 capsule 3  . HYDROcodone-acetaminophen (NORCO) 5-325 MG tablet Take by mouth.    . hydrOXYzine (ATARAX/VISTARIL) 25 MG tablet Take 25 mg by mouth 2 (two) times daily as needed for anxiety.    [name] lactulose (CHRONULAC) 10 GM/15ML solution Take  30 g by mouth 2 (two) times daily as needed for mild constipation.    [name] lamoTRIgine (LAMICTAL) 200 MG tablet Take 1 tablet (200 mg total) by mouth every evening. 30 tablet 0  . loratadine (CLARITIN) 10 MG tablet Take by mouth.    . lurasidone 120 MG TABS Take 1 tablet (120 mg total) by mouth daily. 30 tablet 0  . meclizine (ANTIVERT) 25 MG tablet Take 25 mg by mouth 3 (three) times daily as needed for dizziness.    . meloxicam (MOBIC) 15 MG tablet Take by mouth.    . Multiple Vitamin (MULTIVITAMIN WITH MINERALS) TABS tablet Take 1 tablet by mouth daily.    . naltrexone (DEPADE) 50 MG tablet Take 50 mg by mouth daily.  3  . naproxen  (NAPROSYN) 500 MG tablet Take by mouth.    . Nicotine 21-14-7 MG/24HR KIT Place 1 patch onto the skin daily.  0  . ondansetron (ZOFRAN) 8 MG tablet Take 8 mg by mouth every 8 (eight) hours as needed.  2  . ondansetron (ZOFRAN-ODT) 4 MG disintegrating tablet DISSOLVE ONE TABLET IN MOUTH THREE TIMES A DAY AS NEEDED  1  . PARoxetine (PAXIL) 40 MG tablet Take 40 mg by mouth at bedtime.  3  . promethazine (PHENERGAN) 25 MG tablet TAKE ONE TABLET BY MOUTH THREE TIMES A DAY. AS NEEDED FOR NAUSEA  5  . rizatriptan (MAXALT-MLT) 10 MG disintegrating tablet Take 10 mg by mouth as needed for migraine.     . topiramate (TOPAMAX) 25 MG tablet TAKE 3 TABLETS (75 MG TOTAL) BY MOUTH AT BEDTIME. 270 tablet 1  . traZODone (DESYREL) 300 MG tablet Take 1 tablet (300 mg total) by mouth at bedtime as needed for sleep. 30 tablet 0  . valACYclovir (VALTREX) 1000 MG tablet Take 1 tablet (1,000 mg total) by mouth daily.    [name] zolpidem (AMBIEN) 10 MG tablet Take 10 mg by mouth.     No facility-administered medications prior to visit.     PAST MEDICAL HISTORY: Past Medical History:  Diagnosis Date  . Bipolar 1 disorder ([place])   . Depression   . Dizziness and giddiness 02/01/2016  . Herpes genitalia   . HIV disease ([place])   . Hypertension   . Migraine headache 02/01/2016  . PTSD (post-traumatic stress disorder)   . Schizoaffective disorder ([place])   . Seizures ([place])     PAST SURGICAL HISTORY: Past Surgical History:  Procedure Laterality Date  . BACK SURGERY    . HAND SURGERY      FAMILY HISTORY: Family History  Problem Relation Age of Onset  . Alcohol abuse Mother   . Schizophrenia Father   . Depression Father   . Alcohol abuse Father   . Alcohol abuse Paternal Uncle   . Alcohol abuse Paternal Uncle     SOCIAL HISTORY: Social History   Socioeconomic History  . Marital status: Single    Spouse name: Not on file  . Number of children: Not on file  . Years of education: Not on file  . Highest  education level: Not on file  Occupational History  . Occupation: [employer]  Social Needs  . Financial resource strain: Not on file  . Food insecurity:    Worry: Not on file    Inability: Not on file  . Transportation needs:    Medical: Not on file    Non-medical: Not on file  Tobacco Use  . Smoking status: Current Every Day Smoker  Packs/day: 1.00    Types: Cigarettes  . Smokeless tobacco: Never Used  Substance and Sexual Activity  . Alcohol use: Yes    Alcohol/week: 14.0 standard drinks    Types: 14 Cans of beer per week    Comment: About 2 beers per day  . Drug use: No  . Sexual activity: Yes    Birth control/protection: Condom  Lifestyle  . Physical activity:    Days per week: Not on file    Minutes per session: Not on file  . Stress: Not on file  Relationships  . Social connections:    Talks on phone: Not on file    Gets together: Not on file    Attends religious service: Not on file    Active member of club or organization: Not on file    Attends meetings of clubs or organizations: Not on file    Relationship status: Not on file  . Intimate partner violence:    Fear of current or ex partner: Not on file    Emotionally abused: Not on file    Physically abused: Not on file    Forced sexual activity: Not on file  Other Topics Concern  . Not on file  Social History Narrative   Lives at home alone   Right-handed   Drinks 2-3 sodas per day      PHYSICAL EXAM  Vitals:   07/17/18 1436  BP: 126/84  Pulse: 80  Weight: 292 lb (132.5 kg)  Height: 6' 2"  (1.88 m)   Body mass index is 37.49 kg/m.  Generalized: Well developed, in no acute distress   Neurological examination  Mentation: Alert oriented to time, place, history taking. Follows all commands speech and language fluent Cranial nerve II-XII: Pupils were equal round reactive to light. Extraocular movements were full, visual field were full on confrontational test. Facial sensation and strength were  normal. Uvula tongue midline. Head turning and shoulder shrug  were normal and symmetric. Motor: The motor testing reveals 5 over 5 strength of all 4 extremities. Good symmetric motor tone is noted throughout.  Sensory: Sensory testing is intact to soft touch on all 4 extremities. No evidence of extinction is noted.  Coordination: Cerebellar testing reveals good finger-nose-finger and heel-to-shin bilaterally.  Gait and station: Gait is normal. Reflexes: Deep tendon reflexes are symmetric and normal bilaterally.   DIAGNOSTIC DATA (LABS, IMAGING, TESTING) - I reviewed patient records, labs, notes, testing and imaging myself where available.  Lab Results  Component Value Date   WBC 6.5 04/22/2018   HGB 13.8 04/22/2018   HCT 42.5 04/22/2018   MCV 100.5 (H) 04/22/2018   PLT 163 04/22/2018      Component Value Date/Time   NA 139 04/22/2018 1143   NA 138 07/04/2017 1551   K 3.6 04/22/2018 1143   CL 104 04/22/2018 1143   CO2 22 04/22/2018 1143   GLUCOSE 120 (H) 04/22/2018 1143   BUN 7 04/22/2018 1143   BUN 7 07/04/2017 1551   CREATININE 1.15 04/22/2018 1143   CALCIUM 9.1 04/22/2018 1143   PROT 8.6 (H) 04/22/2018 1143   PROT 7.4 07/04/2017 1551   ALBUMIN 3.7 04/22/2018 1143   ALBUMIN 4.2 07/04/2017 1551   AST 47 (H) 04/22/2018 1143   ALT 40 04/22/2018 1143   ALKPHOS 65 04/22/2018 1143   BILITOT 0.3 04/22/2018 1143   BILITOT 0.3 07/04/2017 1551   GFRNONAA >60 04/22/2018 1143   GFRAA >60 04/22/2018 1143      ASSESSMENT AND  PLAN 32 y.o. year old male  has a past medical history of Bipolar 1 disorder ([place]), Depression, Dizziness and giddiness (02/01/2016), Herpes genitalia, HIV disease ([place]), Hypertension, Migraine headache (02/01/2016), PTSD (post-traumatic stress disorder), Schizoaffective disorder ([place]), and Seizures ([place]). here with:  1.  Daily headaches 2.  Dizziness 3.  Alcohol abuse  The patient will continue on Topamax and gabapentin.  I advised that he should take the  medication consistently.  Also advised that drinking alcohol daily as well as the amount of medication he takes may also be contributing to his symptoms.  Advised that he should consult his primary care provider for resources for alcohol abuse.  Patient voiced understanding.  I will check an alcohol level today.  He does not operate a motor vehicle.  he will return in 6 months or sooner if needed.     Ward Givens, MSN, NP-C 07/17/2018, 2:58 PM [facility] [address] [place], [place] [address] [phone]

## 2018-07-17 NOTE — Progress Notes (Signed)
I have read the note, and I agree with the clinical assessment and plan.  [place] Bellemare K Viva Gallaher   

## 2018-07-17 NOTE — Patient Instructions (Signed)
Your Plan:  Continue Gabapentin and Topamax Blood work today Speak to PCP about alcohol abuse programs  Thank you for coming to see [place] at [facility]. I hope we have been able to provide you high quality care today.  You may receive a patient satisfaction survey over the next few weeks. We would appreciate your feedback and comments so that we may continue to improve ourselves and the health of our patients.

## 2018-07-19 LAB — ETHANOL: Ethanol: 0.089 %

## 2018-07-20 ENCOUNTER — Telehealth: Payer: Self-pay | Admitting: *Deleted

## 2018-07-20 NOTE — Telephone Encounter (Signed)
LMVM for pt on cell (ok per DPR) that lab results came back positive for drinking alcohol, MM/NP wanted pt to be made aware.

## 2018-07-20 NOTE — Telephone Encounter (Signed)
-----   Message from Ward Givens, NP sent at 07/19/2018  2:18 PM EDT ----- Confirms that he has been drinking alcohol.  Patient admitted this in the office visit. can call patient and make him aware.

## 2018-07-22 ENCOUNTER — Emergency Department (HOSPITAL_COMMUNITY)
Admission: EM | Admit: 2018-07-22 | Discharge: 2018-07-22 | Disposition: A | Discharge status: Other Acute Inpatient Hospital | Payer: Medicare HMO | Attending: Emergency Medicine | Admitting: Emergency Medicine

## 2018-07-22 ENCOUNTER — Other Ambulatory Visit: Payer: Self-pay

## 2018-07-22 ENCOUNTER — Encounter (HOSPITAL_COMMUNITY): Payer: Self-pay

## 2018-07-22 ENCOUNTER — Emergency Department (HOSPITAL_COMMUNITY)
Admission: EM | Admit: 2018-07-22 | Discharge: 2018-07-22 | Discharge status: Absent without leave | Payer: Medicare HMO | Source: Home / Self Care

## 2018-07-22 ENCOUNTER — Encounter (HOSPITAL_COMMUNITY): Payer: Self-pay | Admitting: Emergency Medicine

## 2018-07-22 ENCOUNTER — Inpatient Hospital Stay (HOSPITAL_COMMUNITY): Payer: Medicare HMO

## 2018-07-22 ENCOUNTER — Inpatient Hospital Stay (HOSPITAL_COMMUNITY)
Admission: AD | Admit: 2018-07-22 | Discharge: 2018-07-25 | DRG: 885 | Disposition: A | Discharge status: Home / Self Care | Payer: Medicare HMO | Attending: Emergency Medicine | Admitting: Emergency Medicine

## 2018-07-22 DIAGNOSIS — Z79899 Other long term (current) drug therapy: Secondary | ICD-10-CM | POA: Insufficient documentation

## 2018-07-22 DIAGNOSIS — R45851 Suicidal ideations: Secondary | ICD-10-CM | POA: Diagnosis present

## 2018-07-22 DIAGNOSIS — R109 Unspecified abdominal pain: Secondary | ICD-10-CM | POA: Diagnosis not present

## 2018-07-22 DIAGNOSIS — I10 Essential (primary) hypertension: Secondary | ICD-10-CM | POA: Diagnosis present

## 2018-07-22 DIAGNOSIS — F329 Major depressive disorder, single episode, unspecified: Secondary | ICD-10-CM | POA: Diagnosis present

## 2018-07-22 DIAGNOSIS — K219 Gastro-esophageal reflux disease without esophagitis: Secondary | ICD-10-CM | POA: Diagnosis present

## 2018-07-22 DIAGNOSIS — Z818 Family history of other mental and behavioral disorders: Secondary | ICD-10-CM | POA: Diagnosis not present

## 2018-07-22 DIAGNOSIS — Z62810 Personal history of physical and sexual abuse in childhood: Secondary | ICD-10-CM | POA: Diagnosis present

## 2018-07-22 DIAGNOSIS — F4310 Post-traumatic stress disorder, unspecified: Secondary | ICD-10-CM | POA: Diagnosis present

## 2018-07-22 DIAGNOSIS — Z21 Asymptomatic human immunodeficiency virus [HIV] infection status: Secondary | ICD-10-CM | POA: Insufficient documentation

## 2018-07-22 DIAGNOSIS — F1099 Alcohol use, unspecified with unspecified alcohol-induced disorder: Secondary | ICD-10-CM | POA: Insufficient documentation

## 2018-07-22 DIAGNOSIS — G4700 Insomnia, unspecified: Secondary | ICD-10-CM | POA: Diagnosis present

## 2018-07-22 DIAGNOSIS — F17210 Nicotine dependence, cigarettes, uncomplicated: Secondary | ICD-10-CM | POA: Insufficient documentation

## 2018-07-22 DIAGNOSIS — Y908 Blood alcohol level of 240 mg/100 ml or more: Secondary | ICD-10-CM | POA: Insufficient documentation

## 2018-07-22 DIAGNOSIS — B20 Human immunodeficiency virus [HIV] disease: Secondary | ICD-10-CM | POA: Diagnosis present

## 2018-07-22 DIAGNOSIS — Z046 Encounter for general psychiatric examination, requested by authority: Secondary | ICD-10-CM | POA: Insufficient documentation

## 2018-07-22 DIAGNOSIS — Z915 Personal history of self-harm: Secondary | ICD-10-CM

## 2018-07-22 DIAGNOSIS — F251 Schizoaffective disorder, depressive type: Secondary | ICD-10-CM | POA: Insufficient documentation

## 2018-07-22 DIAGNOSIS — R451 Restlessness and agitation: Secondary | ICD-10-CM | POA: Diagnosis not present

## 2018-07-22 DIAGNOSIS — R52 Pain, unspecified: Secondary | ICD-10-CM | POA: Diagnosis not present

## 2018-07-22 DIAGNOSIS — M7989 Other specified soft tissue disorders: Secondary | ICD-10-CM | POA: Diagnosis not present

## 2018-07-22 DIAGNOSIS — F419 Anxiety disorder, unspecified: Secondary | ICD-10-CM | POA: Diagnosis not present

## 2018-07-22 DIAGNOSIS — R42 Dizziness and giddiness: Secondary | ICD-10-CM

## 2018-07-22 DIAGNOSIS — J45909 Unspecified asthma, uncomplicated: Secondary | ICD-10-CM | POA: Diagnosis present

## 2018-07-22 DIAGNOSIS — R2242 Localized swelling, mass and lump, left lower limb: Secondary | ICD-10-CM

## 2018-07-22 LAB — CBC WITH DIFFERENTIAL/PLATELET
Abs Immature Granulocytes: 0.11 10*3/uL — ABNORMAL HIGH (ref 0.00–0.07)
Basophils Absolute: 0.1 10*3/uL (ref 0.0–0.1)
Basophils Relative: 1 %
Eosinophils Absolute: 0.1 10*3/uL (ref 0.0–0.5)
Eosinophils Relative: 2 %
HCT: 40.7 % (ref 39.0–52.0)
Hemoglobin: 12.5 g/dL — ABNORMAL LOW (ref 13.0–17.0)
Immature Granulocytes: 2 %
Lymphocytes Relative: 35 %
Lymphs Abs: 2.2 10*3/uL (ref 0.7–4.0)
MCH: 31.7 pg (ref 26.0–34.0)
MCHC: 30.7 g/dL (ref 30.0–36.0)
MCV: 103.3 fL — ABNORMAL HIGH (ref 80.0–100.0)
Monocytes Absolute: 0.6 10*3/uL (ref 0.1–1.0)
Monocytes Relative: 10 %
Neutro Abs: 3.3 10*3/uL (ref 1.7–7.7)
Neutrophils Relative %: 50 %
Platelets: 154 10*3/uL (ref 150–400)
RBC: 3.94 MIL/uL — ABNORMAL LOW (ref 4.22–5.81)
RDW: 15.3 % (ref 11.5–15.5)
WBC: 6.5 10*3/uL (ref 4.0–10.5)
nRBC: 0 % (ref 0.0–0.2)

## 2018-07-22 LAB — BASIC METABOLIC PANEL
Anion gap: 12 (ref 5–15)
BUN: 5 mg/dL — ABNORMAL LOW (ref 6–20)
CO2: 23 mmol/L (ref 22–32)
Calcium: 9.5 mg/dL (ref 8.9–10.3)
Chloride: 108 mmol/L (ref 98–111)
Creatinine, Ser: 0.98 mg/dL (ref 0.61–1.24)
GFR calc Af Amer: >60 mL/min (ref >60)
GFR calc non Af Amer: >60 mL/min (ref >60)
Glucose, Bld: 115 mg/dL — ABNORMAL HIGH (ref 70–99)
Potassium: 4 mmol/L (ref 3.5–5.1)
Sodium: 143 mmol/L (ref 135–145)

## 2018-07-22 LAB — RAPID URINE DRUG SCREEN, HOSP PERFORMED
Amphetamines: NOT DETECTED
Barbiturates: NOT DETECTED
Benzodiazepines: POSITIVE — AB
Cocaine: NOT DETECTED
Opiates: NOT DETECTED
Tetrahydrocannabinol: NOT DETECTED

## 2018-07-22 LAB — ETHANOL: Alcohol, Ethyl (B): 257 mg/dL — ABNORMAL HIGH (ref <10)

## 2018-07-22 LAB — D-DIMER, QUANTITATIVE: D-Dimer, Quant: 0.73 ug/mL-FEU — ABNORMAL HIGH (ref 0.00–0.50)

## 2018-07-22 MED ORDER — NICOTINE 21 MG/24HR TD PT24
21.0000 mg | MEDICATED_PATCH | Freq: Every day | TRANSDERMAL | Status: DC
  Administered 2018-07-22: 21 mg via TRANSDERMAL
  Filled 2018-07-22: qty 1

## 2018-07-22 MED ORDER — ACETAMINOPHEN 325 MG PO TABS: 650.0000 mg | ORAL_TABLET | Freq: Four times a day (QID) | ORAL | Status: DC | PRN

## 2018-07-22 MED ORDER — TRAZODONE HCL 150 MG PO TABS
300.0000 mg | ORAL_TABLET | Freq: Every evening | ORAL | Status: DC | PRN
  Administered 2018-07-23 – 2018-07-24 (×3): 300 mg via ORAL
  Filled 2018-07-22: qty 3
  Filled 2018-07-22 (×2): qty 2

## 2018-07-22 MED ORDER — BICTEGRAVIR-EMTRICITAB-TENOFOV 50-200-25 MG PO TABS
1.0000 | ORAL_TABLET | Freq: Every day | ORAL | Status: DC
  Administered 2018-07-22: 1 via ORAL
  Filled 2018-07-22: qty 1

## 2018-07-22 MED ORDER — ALUM & MAG HYDROXIDE-SIMETH 200-200-20 MG/5ML PO SUSP: 30.0000 mL | ORAL | Status: DC | PRN

## 2018-07-22 MED ORDER — LAMOTRIGINE 100 MG PO TABS: 200.0000 mg | ORAL_TABLET | Freq: Every evening | ORAL | Status: DC

## 2018-07-22 MED ORDER — HYDROXYZINE HCL 25 MG PO TABS: 25.0000 mg | ORAL_TABLET | Freq: Two times a day (BID) | ORAL | Status: DC | PRN

## 2018-07-22 MED ORDER — LURASIDONE HCL 60 MG PO TABS
120.0000 mg | ORAL_TABLET | Freq: Every day | ORAL | Status: DC
  Administered 2018-07-23: 120 mg via ORAL
  Filled 2018-07-22: qty 3
  Filled 2018-07-22 (×2): qty 2

## 2018-07-22 MED ORDER — ALPRAZOLAM 0.5 MG PO TABS
1.0000 mg | ORAL_TABLET | Freq: Three times a day (TID) | ORAL | Status: DC | PRN
  Administered 2018-07-22 – 2018-07-23 (×2): 1 mg via ORAL
  Filled 2018-07-22 (×2): qty 2
  Filled 2018-07-22 (×2): qty 1

## 2018-07-22 MED ORDER — MAGNESIUM HYDROXIDE 400 MG/5ML PO SUSP
30.0000 mL | Freq: Every day | ORAL | Status: DC | PRN
  Filled 2018-07-22: qty 30

## 2018-07-22 MED ORDER — LAMOTRIGINE 200 MG PO TABS
200.0000 mg | ORAL_TABLET | Freq: Every evening | ORAL | Status: DC
  Administered 2018-07-22 – 2018-07-24 (×3): 200 mg via ORAL
  Filled 2018-07-22: qty 2
  Filled 2018-07-22 (×5): qty 1

## 2018-07-22 MED ORDER — SUMATRIPTAN SUCCINATE 50 MG PO TABS
100.0000 mg | ORAL_TABLET | ORAL | Status: DC | PRN
  Filled 2018-07-22: qty 2

## 2018-07-22 MED ORDER — ONDANSETRON 8 MG PO TBDP
8.0000 mg | ORAL_TABLET | Freq: Three times a day (TID) | ORAL | Status: DC | PRN
  Administered 2018-07-22: 8 mg via ORAL
  Filled 2018-07-22: qty 1

## 2018-07-22 MED ORDER — ONDANSETRON 4 MG PO TBDP
8.0000 mg | ORAL_TABLET | Freq: Three times a day (TID) | ORAL | Status: DC | PRN
  Administered 2018-07-22 – 2018-07-25 (×6): 8 mg via ORAL
  Filled 2018-07-22 (×3): qty 2
  Filled 2018-07-22: qty 1
  Filled 2018-07-22 (×2): qty 2

## 2018-07-22 MED ORDER — NICOTINE 21 MG/24HR TD PT24
21.0000 mg | MEDICATED_PATCH | Freq: Every day | TRANSDERMAL | Status: DC
  Administered 2018-07-23 – 2018-07-25 (×3): 21 mg via TRANSDERMAL
  Filled 2018-07-22 (×6): qty 1

## 2018-07-22 MED ORDER — RIZATRIPTAN BENZOATE 10 MG PO TBDP: 10.0000 mg | ORAL_TABLET | ORAL | Status: DC | PRN

## 2018-07-22 MED ORDER — GABAPENTIN 300 MG PO CAPS
300.0000 mg | ORAL_CAPSULE | Freq: Two times a day (BID) | ORAL | Status: DC
  Administered 2018-07-22: 300 mg via ORAL
  Filled 2018-07-22: qty 1

## 2018-07-22 MED ORDER — GABAPENTIN 300 MG PO CAPS
300.0000 mg | ORAL_CAPSULE | Freq: Two times a day (BID) | ORAL | Status: DC
  Administered 2018-07-22 – 2018-07-23 (×2): 300 mg via ORAL
  Filled 2018-07-22 (×6): qty 1

## 2018-07-22 MED ORDER — LURASIDONE HCL 40 MG PO TABS
120.0000 mg | ORAL_TABLET | Freq: Every day | ORAL | Status: DC
  Administered 2018-07-22: 120 mg via ORAL
  Filled 2018-07-22: qty 3

## 2018-07-22 MED ORDER — ALBUTEROL SULFATE HFA 108 (90 BASE) MCG/ACT IN AERS: 2.0000 | INHALATION_SPRAY | Freq: Four times a day (QID) | RESPIRATORY_TRACT | Status: DC | PRN

## 2018-07-22 MED ORDER — ALPRAZOLAM 0.5 MG PO TABS
1.0000 mg | ORAL_TABLET | Freq: Three times a day (TID) | ORAL | Status: DC | PRN
  Administered 2018-07-22: 1 mg via ORAL
  Filled 2018-07-22: qty 2

## 2018-07-22 MED ORDER — SUMATRIPTAN SUCCINATE 100 MG PO TABS
100.0000 mg | ORAL_TABLET | ORAL | Status: DC | PRN
  Filled 2018-07-22: qty 2

## 2018-07-22 MED ORDER — NICOTINE 21-14-7 MG/24HR TD KIT: 1.0000 | PACK | Freq: Every day | TRANSDERMAL | Status: DC

## 2018-07-22 MED ORDER — TRAZODONE HCL 100 MG PO TABS: 300.0000 mg | ORAL_TABLET | Freq: Every evening | ORAL | Status: DC | PRN

## 2018-07-22 MED ORDER — BICTEGRAVIR-EMTRICITAB-TENOFOV 50-200-25 MG PO TABS
1.0000 | ORAL_TABLET | Freq: Every day | ORAL | Status: DC
  Administered 2018-07-23 – 2018-07-25 (×3): 1 via ORAL
  Filled 2018-07-22 (×6): qty 1

## 2018-07-22 NOTE — ED Notes (Signed)
X-ray in with pt

## 2018-07-22 NOTE — Progress Notes (Signed)
Patient may go to [facility] later today. CSW will update bed assignment and time the patient is able to transport once updated.  RN Call for report: [phone]  Accepting provider: Dr. Courtney Paris, [place] Social Worker [phone]

## 2018-07-22 NOTE — ED Triage Notes (Signed)
Patient arrived with [facility] police department. Patient is IVC, mobile crisis put the IVC in place . Hx of schizophrenia and SI. Pt overdosed on prescribed medication. Pt has had visual and auditory hallucinations.

## 2018-07-22 NOTE — Tx Team (Signed)
Initial Treatment Plan 07/22/2018 6:00 PM [name] [id number]    PATIENT STRESSORS: Health problems Occupational concerns Substance abuse   PATIENT STRENGTHS: Ability for insight Capable of independent living Curator fund of knowledge Motivation for treatment/growth   PATIENT IDENTIFIED PROBLEMS: "get the voices to go away"  "work on my depression"                   DISCHARGE CRITERIA:  Ability to meet basic life and health needs Adequate post-discharge living arrangements Improved stabilization in mood, thinking, and/or behavior Motivation to continue treatment in a less acute level of care  PRELIMINARY DISCHARGE PLAN: Return to previous living arrangement  PATIENT/FAMILY INVOLVEMENT: This treatment plan has been presented to and reviewed with the patient, [name].  The patient and family have been given the opportunity to ask questions and make suggestions.  Baron Sane, RN 07/22/2018, 6:00 PM

## 2018-07-22 NOTE — ED Provider Notes (Signed)
[place] DEPT Provider Note   CSN: [id number] Arrival date & time: 07/22/18  2205     History   Chief Complaint Chief Complaint  Patient presents with  . Leg Swelling    L    HPI Wilfrid L Wainwright is a 32 y.o. male.  HPI   32 yo M with h/o bipolar d/o here with L leg pain. Pt states that his R leg has been painful and swollen for "a few months." He has had intermittent swelling as well. The pain si aching, fullness feeling in his calf that is worse after walking. Pt states he walks "a lot." No alleviating factors other than rest. He also reports some occasional cough that has worsened. No CP. Of note, tp just seen for his bipolar d/o and is currently hospitalized at [facility]. He was sent here after making staff aware of his leg swelling.  Past Medical History:  Diagnosis Date  . Bipolar 1 disorder ([place])   . Depression   . Dizziness and giddiness 02/01/2016  . Herpes genitalia   . HIV disease ([place])   . Hypertension   . Migraine headache 02/01/2016  . PTSD (post-traumatic stress disorder)   . Schizoaffective disorder ([place])   . Seizures [facility])     Patient Active Problem List   Diagnosis Date Noted  . Schizoaffective disorder ([place]) 07/22/2018  . Dizziness and giddiness 02/01/2016  . Migraine headache 02/01/2016  . Schizoaffective disorder, depressive type ([place]) 05/13/2015  . Severe alcohol dependence ([place]) 05/13/2015  . Suicidal ideation 01/12/2014    Past Surgical History:  Procedure Laterality Date  . BACK SURGERY    . HAND SURGERY          Home Medications    Prior to Admission medications   Medication Sig Start Date End Date Taking? Authorizing Provider  albuterol (PROVENTIL HFA;VENTOLIN HFA) 108 (90 BASE) MCG/ACT inhaler Inhale 2 puffs into the lungs every 6 (six) hours as needed for wheezing or shortness of breath.   Yes [provider]  ALPRAZolam Duanne Moron) 1 MG tablet Take 1 mg by mouth 3 (three) times daily.    Yes  [provider]  bictegravir-emtricitabine-tenofovir AF (BIKTARVY) 50-200-25 MG TABS tablet Take 1 tablet by mouth daily.  02/24/17  Yes [provider]  cetirizine (ZYRTEC) 10 MG tablet Take 1 tablet (10 mg total) by mouth daily. 05/15/15  Yes Niel Hummer, NP  cloNIDine (CATAPRES) 0.1 MG tablet Take 0.1 mg by mouth daily.  07/14/15  Yes [provider]  cyclobenzaprine (FLEXERIL) 10 MG tablet Take 10 mg by mouth 3 (three) times daily as needed for muscle spasms.   Yes [provider]  folic acid (FOLVITE) 1 MG tablet Take 1 mg by mouth daily.   Yes [provider]  gabapentin (NEURONTIN) 300 MG capsule TAKE 1 CAPSULE (300 MG TOTAL) BY MOUTH 2 (TWO) TIMES DAILY. 05/09/18  Yes Kathrynn Ducking, MD  haloperidol (HALDOL) 5 MG tablet Take 5 mg by mouth daily.   Yes [provider]  HYDROcodone-acetaminophen (NORCO) 5-325 MG tablet Take 1 tablet by mouth every 6 (six) hours as needed for moderate pain.  03/26/14  Yes [provider]  hydrOXYzine (ATARAX/VISTARIL) 25 MG tablet Take 25 mg by mouth 2 (two) times daily.    Yes [provider]  lamoTRIgine (LAMICTAL) 200 MG tablet Take 1 tablet (200 mg total) by mouth every evening. 05/15/15  Yes Niel Hummer, NP  linaclotide Rolan Lipa) [name]  capsule Take 145 mcg by mouth daily as needed (constipation).   Yes [provider]  lurasidone 120 MG TABS Take 1 tablet (120 mg total) by mouth daily. 05/15/15  Yes Niel Hummer, NP  meclizine (ANTIVERT) 25 MG tablet Take 25 mg by mouth 3 (three) times daily as needed for dizziness.   Yes [provider]  Multiple Vitamin (MULTIVITAMIN WITH MINERALS) TABS tablet Take 1 tablet by mouth daily. 05/15/15  Yes Niel Hummer, NP  ondansetron (ZOFRAN) 8 MG tablet Take 8 mg by mouth every 8 (eight) hours as needed for nausea or vomiting.  06/15/17  Yes [provider]  pantoprazole (PROTONIX) 20 MG tablet Take 20 mg by mouth  daily.   Yes [provider]  PARoxetine (PAXIL) 40 MG tablet Take 40 mg by mouth at bedtime. 06/20/17  Yes [provider]  promethazine (PHENERGAN) 25 MG tablet Take 25 mg by mouth every 6 (six) hours as needed for nausea or vomiting.  06/20/17  Yes [provider]  rizatriptan (MAXALT-MLT) 10 MG disintegrating tablet Take 10 mg by mouth as needed for migraine.  02/21/14  Yes [provider]  Thiamine HCl (VITAMIN B-1 PO) Take 1 tablet by mouth daily.   Yes [provider]  topiramate (TOPAMAX) 25 MG tablet TAKE 3 TABLETS (75 MG TOTAL) BY MOUTH AT BEDTIME. 07/05/18  Yes Kathrynn Ducking, MD  traZODone (DESYREL) 300 MG tablet Take 1 tablet (300 mg total) by mouth at bedtime as needed for sleep. Patient taking differently: Take 300 mg by mouth at bedtime.  05/15/15  Yes Niel Hummer, NP  valACYclovir (VALTREX) 1000 MG tablet Take 1 tablet (1,000 mg total) by mouth daily. 05/15/15  Yes Niel Hummer, NP  zolpidem (AMBIEN) 10 MG tablet Take 10 mg by mouth at bedtime.    Yes [provider]  Nicotine 21-14-7 MG/24HR KIT Place 1 patch onto the skin daily. Patient not taking: Reported on 07/22/2018 05/15/15   Niel Hummer, NP    Family History Family History  Problem Relation Age of Onset  . Alcohol abuse Mother   . Schizophrenia Father   . Depression Father   . Alcohol abuse Father   . Alcohol abuse Paternal Uncle   . Alcohol abuse Paternal Uncle     Social History Social History   Tobacco Use  . Smoking status: Current Every Day Smoker    Packs/day: 1.00    Types: Cigarettes  . Smokeless tobacco: Never Used  Substance Use Topics  . Alcohol use: Yes    Alcohol/week: 14.0 standard drinks    Types: 14 Cans of beer per week    Comment: 5-6 40oz per day  . Drug use: No     Allergies   Patient has no known allergies.   Review of Systems Review of Systems  Constitutional: Positive for fatigue. Negative for chills and fever.    HENT: Negative for congestion and rhinorrhea.   Eyes: Negative for visual disturbance.  Respiratory: Positive for cough and shortness of breath. Negative for wheezing.   Cardiovascular: Positive for leg swelling. Negative for chest pain.  Gastrointestinal: Negative for abdominal pain, diarrhea, nausea and vomiting.  Genitourinary: Negative for dysuria and flank pain.  Musculoskeletal: Negative for neck pain and neck stiffness.  Skin: Negative for rash and wound.  Allergic/Immunologic: Negative for immunocompromised state.  Neurological: Negative for syncope, weakness and headaches.  All other systems reviewed and are negative.    Physical Exam Updated  Vital Signs BP 123/82 (BP Location: Left Arm)   Pulse (!) 102   Temp 98.1 F (36.7 C) (Oral)   Resp 17   Ht 6' 3"  (1.905 m)   Wt 127 kg   SpO2 97%   BMI 35.00 kg/m   Physical Exam  Constitutional: He is oriented to person, place, and time. He appears well-developed and well-nourished. No distress.  HENT:  Head: Normocephalic and atraumatic.  Eyes: Conjunctivae are normal.  Neck: Neck supple.  Cardiovascular: Normal rate, regular rhythm and normal heart sounds. Exam reveals no friction rub.  No murmur heard. Pulmonary/Chest: Effort normal and breath sounds normal. No respiratory distress. He has no wheezes. He has no rales.  Abdominal: He exhibits no distension.  Musculoskeletal: He exhibits no edema.  Neurological: He is alert and oriented to person, place, and time. He exhibits normal muscle tone.  Skin: Skin is warm. Capillary refill takes less than 2 seconds.  Psychiatric: He has a normal mood and affect.  Nursing note and vitals reviewed.   LOWER EXTREMITY EXAM: BILATERALY  INSPECTION & PALPATION: 1+ pitting edema b/l LE. There is TTP and moderate soft tissue swelling of L gastroc body, without associated warmth, induration, or fluctuance. Mild TTP.   SENSORY: sensation is intact to light touch in:  Superficial  peroneal nerve distribution (over dorsum of foot) Deep peroneal nerve distribution (over first dorsal web space) Sural nerve distribution (over lateral aspect 5th metatarsal) Saphenous nerve distribution (over medial instep)  MOTOR:  + Motor EHL (great toe dorsiflexion) + FHL (great toe plantar flexion)  + TA (ankle dorsiflexion)  + GSC (ankle plantar flexion)  VASCULAR: 2+ dorsalis pedis and posterior tibialis pulses Capillary refill < 2 sec, toes warm and well-perfused  COMPARTMENTS: Soft, warm, well-perfused No pain with passive extension No parethesias   ED Treatments / Results  Labs (all labs ordered are listed, but only abnormal results are displayed) Labs Reviewed  D-DIMER, QUANTITATIVE (NOT AT [facility]) - Abnormal; Notable for the following components:      Result Value   D-Dimer, Quant 0.73 (*)    All other components within normal limits  CK    EKG None  Radiology Dg Tibia/fibula Left  Result Date: 07/22/2018 CLINICAL DATA:  Left calf and lower leg swelling. EXAM: LEFT TIBIA AND FIBULA - 2 VIEW COMPARISON:  None. FINDINGS: Soft tissue swelling about the malleoli more so medially with more diffuse subcutaneous soft tissue edema noted of the left calf possibly from cellulitis, venous insufficiency, or third spacing along some considerations given lack of traumatic history. No acute osseous abnormality or bone destruction. Soft tissue prominence of the calf which may be normal for this patient however and if this is a new acute finding, consider a hematoma or other underlying intramuscular abnormality. IMPRESSION: 1. Nonspecific diffuse soft tissue edema the left calf and leg extending to the ankle. 2. Soft tissue prominence of the calf question normal for this patient. If this is a new acute finding, consider the possibility of intramuscular hematoma or other underlying soft tissue pathology. This can be correlated clinically with the contralateral leg. 3. No acute osseous  abnormality. Electronically Signed   By: Ashley Royalty M.D.   On: 07/22/2018 23:35   Ct Angio Chest Pe W And/or Wo Contrast  Result Date: 07/23/2018 CLINICAL DATA:  Left calf swelling. History of hypertension and HIV. Positive D-dimer. EXAM: CT ANGIOGRAPHY CHEST WITH CONTRAST TECHNIQUE: Multidetector CT imaging of the chest was performed using the standard protocol during  bolus administration of intravenous contrast. Multiplanar CT image reconstructions and MIPs were obtained to evaluate the vascular anatomy. CONTRAST:  164m ISOVUE-370 IOPAMIDOL (ISOVUE-370) INJECTION 76% COMPARISON:  None. FINDINGS: Cardiovascular: Moderately good opacification of the central and segmental pulmonary arteries. No focal filling defects. No evidence of significant pulmonary embolus. Normal heart size. No pericardial effusion. Normal caliber thoracic aorta. No aortic dissection. Great vessel origins are patent. Mediastinum/Nodes: No significant lymphadenopathy in the chest. Esophagus is decompressed. Lungs/Pleura: Motion artifact limits examination. Dependent atelectasis in the lung bases. No airspace disease or consolidation is suggested. No pleural effusions. No pneumothorax. Airways are patent. Upper Abdomen: Diffuse fatty infiltration of the liver. Musculoskeletal: No chest wall abnormality. No acute or significant osseous findings. Review of the MIP images confirms the above findings. IMPRESSION: 1. No evidence of significant pulmonary embolus. 2. No evidence of active pulmonary disease. 3. Diffuse fatty infiltration of the liver. Electronically Signed   By: WLucienne CapersM.D.   On: 07/23/2018 01:04   [place] Non Vascular  Result Date: 07/23/2018 CLINICAL DATA:  Leg swelling to the left calf.  History of HIV. EXAM: ULTRASOUND left LOWER EXTREMITY LIMITED TECHNIQUE: Ultrasound examination of the lower extremity soft tissues was performed in the area of clinical concern. COMPARISON:  None.  FINDINGS: Ultrasound examination of the soft tissues of the left calf in the area of abnormality demonstrate a heterogeneous solid-appearing mass like structure measuring 19.3 x 4.8 x 11.2 cm. Striation is within this structure suggested to be of muscular origin. This may represent myositis, muscle injury, hematoma, or a soft tissue mass. Consider MRI for further evaluation. No loculated fluid collections. IMPRESSION: Heterogeneous masslike structure in the left calf corresponding to the area of abnormality. This could represent myositis, muscle injury, hematoma, or soft tissue mass. Consider MRI for further evaluation if clinically indicated. Electronically Signed   By: WLucienne CapersM.D.   On: 07/23/2018 03:46    Procedures Procedures (including critical care time)  Medications Ordered in ED Medications  acetaminophen (TYLENOL) tablet 650 mg (has no administration in time range)  alum & mag hydroxide-simeth (MAALOX/MYLANTA) 200-200-20 MG/5ML suspension 30 mL (has no administration in time range)  magnesium hydroxide (MILK OF MAGNESIA) suspension 30 mL (has no administration in time range)  albuterol (PROVENTIL HFA;VENTOLIN HFA) 108 (90 Base) MCG/ACT inhaler 2 puff (has no administration in time range)  ALPRAZolam (XANAX) tablet 1 mg (1 mg Oral Given 07/23/18 0205)  bictegravir-emtricitabine-tenofovir AF (BIKTARVY) 50-200-25 MG per tablet 1 tablet (has no administration in time range)  gabapentin (NEURONTIN) capsule 300 mg (300 mg Oral Given 07/22/18 1818)  hydrOXYzine (ATARAX/VISTARIL) tablet 25 mg (has no administration in time range)  lamoTRIgine (LAMICTAL) tablet 200 mg (200 mg Oral Given 07/22/18 1818)  Lurasidone HCl TABS 120 mg (has no administration in time range)  nicotine (NICODERM CQ - dosed in mg/24 hours) patch 21 mg (has no administration in time range)  ondansetron (ZOFRAN-ODT) disintegrating tablet 8 mg (8 mg Oral Given 07/22/18 1820)  SUMAtriptan (IMITREX) tablet 100 mg (has  no administration in time range)  traZODone (DESYREL) tablet 300 mg (300 mg Oral Given 07/23/18 0205)  sodium chloride 0.9 % injection (has no administration in time range)  iopamidol (ISOVUE-370) 76 % injection (has no administration in time range)  iopamidol (ISOVUE-370) 76 % injection 100 mL (100 mLs Intravenous Contrast Given 07/23/18 0050)     Initial Impression / Assessment and Plan / ED Course  I have reviewed the triage  vital signs and the nursing notes.  Pertinent labs & imaging results that were available during my care of the patient were reviewed by me and considered in my medical decision making (see chart for details).     32 yo M here with L>R leg swelling. On exam, he has focal soft tissue swelling of his gastroc concerning for possible sprain, soft tissue contusion/hematoma. Less likely DVT. However, D-Dimer positive so U/S ordered, CT Angio given mild tachycardia, c/o SOB and cough.  Plain films raise question of IM hematoma versus soft tissue abnormality. Will still order DVT study given + D-Dimer, though the pos D-Dimer could be 2/2 his underlying HIV, chronic inflammation. No clinical evidence of abscess, cellulitis, or infectious process. Distal pulses intact. Will check U/S of soft tissue area as well.  Given that pt currently admitted to [facility], will hold until U/S available this AM, can then be medically cleared. Lovenox held 2/2 question of hematoma.  ADDENDUM: U/S shows heterogenous mass - likely muscle injury, less likely myositis. Will add on CK. Awaiting DVT Study this AM.  Final Clinical Impressions(s) / ED Diagnoses   Final diagnoses:  Leg swelling    ED Discharge Orders    None       Duffy Bruce, MD 07/23/18 [phone]
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
## 2018-07-22 NOTE — ED Provider Notes (Signed)
[facility] DEPT Provider Note   CSN: [id number] Arrival date & time: 07/22/18  3893     History   Chief Complaint Chief Complaint  Patient presents with  . Suicidal  . Alcohol Intoxication    HPI [name] is a 32 y.o. male.  HPI Patient presents under involuntary commitment. Patient himself acknowledges multiple medical issues including psychiatric disease, states that he takes his medication regularly, but has not yet done so today. Today, after hearing voices he called his behavioral health team, and when he came to assess him he also reportedly acknowledged suicidal ideation, prompting involuntary commitment. Currently the patient denies any auditory hallucination or suicidal intent. He does acknowledge chronic abdominal discomfort, but no nausea, denies recent vomiting or diarrhea. He arrives via police, who provides much of the HPI, in addition to the patient's details.  Past Medical History:  Diagnosis Date  . Bipolar 1 disorder ([place])   . Depression   . Dizziness and giddiness 02/01/2016  . Herpes genitalia   . HIV disease ([place])   . Hypertension   . Migraine headache 02/01/2016  . PTSD (post-traumatic stress disorder)   . Schizoaffective disorder ([place])   . Seizures [facility])     Patient Active Problem List   Diagnosis Date Noted  . Dizziness and giddiness 02/01/2016  . Migraine headache 02/01/2016  . Schizophrenia, schizo-affective type, depressed ([place]) 05/13/2015  . Severe alcohol dependence ([place]) 05/13/2015  . Suicidal ideation 01/12/2014    Past Surgical History:  Procedure Laterality Date  . BACK SURGERY    . HAND SURGERY          Home Medications    Prior to Admission medications   Medication Sig Start Date End Date Taking? Authorizing Provider  acetaminophen (TYLENOL) 325 MG tablet Take 2 tablets (650 mg total) by mouth every 6 (six) hours as needed for mild pain. 05/15/15   Niel Hummer, NP    acetaminophen-codeine (TYLENOL #3) 300-30 MG tablet  01/16/16   [provider]  albuterol (PROVENTIL HFA;VENTOLIN HFA) 108 (90 BASE) MCG/ACT inhaler Inhale 2 puffs into the lungs every 6 (six) hours as needed for wheezing or shortness of breath.    [provider]  ALPRAZolam Duanne Moron) 1 MG tablet Take 1 mg by mouth.    [provider]  bictegravir-emtricitabine-tenofovir AF (BIKTARVY) 50-200-25 MG TABS tablet Take by mouth. 02/24/17   [provider]  cetirizine (ZYRTEC) 10 MG tablet Take 1 tablet (10 mg total) by mouth daily. 05/15/15   Niel Hummer, NP  cloNIDine (CATAPRES) 0.1 MG tablet  07/14/15   [provider]  cyclobenzaprine (FLEXERIL) 10 MG tablet Take 10 mg by mouth 3 (three) times daily as needed for muscle spasms.    [provider]  diclofenac sodium (VOLTAREN) 1 % GEL  02/17/14   [provider]  dicyclomine (BENTYL) 10 MG capsule Take 1 capsule (10 mg total) by mouth 4 (four) times daily -  before meals and at bedtime. 05/15/15   Niel Hummer, NP  gabapentin (NEURONTIN) 300 MG capsule TAKE 1 CAPSULE (300 MG TOTAL) BY MOUTH 2 (TWO) TIMES DAILY. 05/09/18   Kathrynn Ducking, MD  HYDROcodone-acetaminophen (NORCO) 5-325 MG tablet Take by mouth. 03/26/14   [provider]  hydrOXYzine (ATARAX/VISTARIL) 25 MG tablet Take 25 mg by mouth 2 (two) times daily as needed for anxiety.    [provider]  lactulose (CHRONULAC) 10 GM/15ML solution Take 30 g by mouth 2 (  two) times daily as needed for mild constipation.    [provider]  lamoTRIgine (LAMICTAL) 200 MG tablet Take 1 tablet (200 mg total) by mouth every evening. 05/15/15   Niel Hummer, NP  loratadine (CLARITIN) 10 MG tablet Take by mouth.    [provider]  lurasidone 120 MG TABS Take 1 tablet (120 mg total) by mouth daily. 05/15/15   Niel Hummer, NP  meclizine (ANTIVERT) 25 MG tablet Take 25 mg by mouth 3 (three) times daily as needed for  dizziness.    [provider]  meloxicam (MOBIC) 15 MG tablet Take by mouth.    [provider]  Multiple Vitamin (MULTIVITAMIN WITH MINERALS) TABS tablet Take 1 tablet by mouth daily. 05/15/15   Niel Hummer, NP  naltrexone (DEPADE) 50 MG tablet Take 50 mg by mouth daily. 06/20/17   [provider]  naproxen (NAPROSYN) 500 MG tablet Take by mouth.    [provider]  Nicotine 21-14-7 MG/24HR KIT Place 1 patch onto the skin daily. 05/15/15   Niel Hummer, NP  ondansetron (ZOFRAN) 8 MG tablet Take 8 mg by mouth every 8 (eight) hours as needed. 06/15/17   [provider]  ondansetron (ZOFRAN-ODT) 4 MG disintegrating tablet DISSOLVE ONE TABLET IN MOUTH THREE TIMES A DAY AS NEEDED 06/08/17   [provider]  PARoxetine (PAXIL) 40 MG tablet Take 40 mg by mouth at bedtime. 06/20/17   [provider]  promethazine (PHENERGAN) 25 MG tablet TAKE ONE TABLET BY MOUTH THREE TIMES A DAY. AS NEEDED FOR NAUSEA 06/20/17   [provider]  rizatriptan (MAXALT-MLT) 10 MG disintegrating tablet Take 10 mg by mouth as needed for migraine.  02/21/14   [provider]  topiramate (TOPAMAX) 25 MG tablet TAKE 3 TABLETS (75 MG TOTAL) BY MOUTH AT BEDTIME. 07/05/18   Kathrynn Ducking, MD  traZODone (DESYREL) 300 MG tablet Take 1 tablet (300 mg total) by mouth at bedtime as needed for sleep. 05/15/15   Niel Hummer, NP  valACYclovir (VALTREX) 1000 MG tablet Take 1 tablet (1,000 mg total) by mouth daily. 05/15/15   Niel Hummer, NP  zolpidem (AMBIEN) 10 MG tablet Take 10 mg by mouth.    [provider]    Family History Family History  Problem Relation Age of Onset  . Alcohol abuse Mother   . Schizophrenia Father   . Depression Father   . Alcohol abuse Father   . Alcohol abuse Paternal Uncle   . Alcohol abuse Paternal Uncle     Social History Social History   Tobacco Use  . Smoking status: Current Every Day Smoker    Packs/day:  1.00    Types: Cigarettes  . Smokeless tobacco: Never Used  Substance Use Topics  . Alcohol use: Yes    Alcohol/week: 14.0 standard drinks    Types: 14 Cans of beer per week    Comment: About 2 beers per day  . Drug use: No     Allergies   Patient has no known allergies.   Review of Systems Review of Systems  Constitutional:       Per HPI, otherwise negative  HENT:       Per HPI, otherwise negative  Respiratory:       Per HPI, otherwise negative  Cardiovascular:       Per HPI, otherwise negative  Gastrointestinal: Positive for abdominal pain. Negative for vomiting.  Endocrine:  Negative aside from HPI  Genitourinary:       Neg aside from HPI   Musculoskeletal:       Per HPI, otherwise negative  Skin: Negative.   Allergic/Immunologic: Positive for immunocompromised state.  Neurological: Negative for syncope.  Psychiatric/Behavioral:       Reported suicidal ideation, auditory hallucination     Physical Exam Updated Vital Signs Temp 97.6 F (36.4 C) (Oral)   Ht 6' (1.829 m)   Wt 126.1 kg   BMI 37.70 kg/m   Physical Exam  Constitutional: He is oriented to person, place, and time. He appears well-developed. No distress.  HENT:  Head: Normocephalic and atraumatic.  Eyes: Conjunctivae and EOM are normal.  Cardiovascular: Normal rate and regular rhythm.  Pulmonary/Chest: Effort normal. No stridor. No respiratory distress.  Abdominal: He exhibits no distension.  Musculoskeletal: He exhibits no edema.  Neurological: He is alert and oriented to person, place, and time.  Skin: Skin is warm and dry.  Psychiatric: His affect is blunt. He expresses no suicidal ideation.  Patient is awake and alert, interacting with slow, sustained responses anemia, has some insight into his presentation, acknowledges describing suicidal intent earlier in the day, hearing voices earlier today, denies these currently.  Nursing note and vitals reviewed.    ED Treatments / Results   Labs Labs reviewed  Procedures Procedures (including critical care time)  Medications Ordered in ED Medications  hydrOXYzine (ATARAX/VISTARIL) tablet 25 mg (has no administration in time range)  albuterol (PROVENTIL HFA;VENTOLIN HFA) 108 (90 Base) MCG/ACT inhaler 2 puff (has no administration in time range)  ALPRAZolam (XANAX) tablet 1 mg (has no administration in time range)  bictegravir-emtricitabine-tenofovir AF (BIKTARVY) 50-200-25 MG per tablet 1 tablet (has no administration in time range)  gabapentin (NEURONTIN) capsule 300 mg (has no administration in time range)  lamoTRIgine (LAMICTAL) tablet 200 mg (has no administration in time range)  Lurasidone HCl TABS 120 mg (has no administration in time range)  Nicotine 21-14-7 MG/24HR KIT 1 patch (has no administration in time range)  ondansetron (ZOFRAN-ODT) disintegrating tablet 8 mg (has no administration in time range)  rizatriptan (MAXALT-MLT) disintegrating tablet 10 mg (has no administration in time range)  traZODone (DESYREL) tablet 300 mg (has no administration in time range)     Initial Impression / Assessment and Plan / ED Course  I have reviewed the triage vital signs and the nursing notes.  Pertinent labs & imaging results that were available during my care of the patient were reviewed by me and considered in my medical decision making (see chart for details).  With multiple medical issues, psychiatric issues including HIV and schizophrenia presents after endorsing suicidal intent to his. Patient currently denies any of this, or any auditory hallucination Patient is awake, alert, in no distress Physical exam reassuring, patient appropriate for psychiatric evaluation.  10:58 AM Patient in no distress.  Final Clinical Impressions(s) / ED Diagnoses  Suicidal ideation   [name], MD 07/22/18 1058

## 2018-07-22 NOTE — ED Triage Notes (Signed)
Patient has had 4 40 ounce beers and appears intoxicated per [employer].

## 2018-07-22 NOTE — ED Notes (Signed)
PT belonging: black slides, black sweat pants, green shirt, black sock, pack of [place], $5(2), $1(1), set of keys. Belongings on 9-12/23-25 at nurse desk

## 2018-07-22 NOTE — Significant Event (Signed)
Notified by NS, patient sx of swollen, warm, LLE/Calf, no hx of injury,no recent prolonged confinement,no hx blood dyscrasias, but is a smoker. He endorses pain of his LLE, denies panic with ambulation or weakness, but has noticed increased fatigue with physical exertion. A/P: Suspect DVT of LLE Send to [facility] ED for DVT w/u, ESR, CRP, D-dimer and LLE U/s.

## 2018-07-22 NOTE — ED Triage Notes (Signed)
Pt admitted at [facility] and reports L calf swelling. Denies pain or warmth, but endorsing bilat feet tingling.

## 2018-07-22 NOTE — Progress Notes (Signed)
Writer spoke with patient 1:1 and went over medications available for him tonight. He reported to Probation officer about feeling dizzy, which he reports is usual for him. He then  asked writer to look at his left calf that he felt was swollen. He denied having any pain but did report that his foot was swollen as well. Writer contacted S. Simon NP to assess his leg. Writer received an order for him to be sent out for evaluation. Safety maintained on unit with 15 min checks.

## 2018-07-22 NOTE — ED Notes (Signed)
Bed: [facility] Expected date:  Expected time:  Means of arrival:  Comments: 

## 2018-07-22 NOTE — BH Assessment (Signed)
[facility] Assessment Progress Note     Patient has been accepted to [facility] and can be admitted there at 4:00 pm
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
## 2018-07-23 ENCOUNTER — Inpatient Hospital Stay (HOSPITAL_COMMUNITY): Payer: Medicare HMO

## 2018-07-23 ENCOUNTER — Encounter (HOSPITAL_COMMUNITY): Payer: Self-pay

## 2018-07-23 DIAGNOSIS — M7989 Other specified soft tissue disorders: Secondary | ICD-10-CM

## 2018-07-23 DIAGNOSIS — F4310 Post-traumatic stress disorder, unspecified: Secondary | ICD-10-CM

## 2018-07-23 DIAGNOSIS — R45851 Suicidal ideations: Secondary | ICD-10-CM

## 2018-07-23 DIAGNOSIS — F251 Schizoaffective disorder, depressive type: Principal | ICD-10-CM

## 2018-07-23 DIAGNOSIS — G4700 Insomnia, unspecified: Secondary | ICD-10-CM

## 2018-07-23 DIAGNOSIS — R52 Pain, unspecified: Secondary | ICD-10-CM

## 2018-07-23 DIAGNOSIS — F419 Anxiety disorder, unspecified: Secondary | ICD-10-CM

## 2018-07-23 LAB — CK: Total CK: 169 U/L (ref 49–397)

## 2018-07-23 MED ORDER — LORAZEPAM 2 MG/ML IJ SOLN: 1.0000 mg | Freq: Four times a day (QID) | INTRAMUSCULAR | Status: DC | PRN

## 2018-07-23 MED ORDER — HYDROXYZINE HCL 50 MG PO TABS
50.0000 mg | ORAL_TABLET | Freq: Four times a day (QID) | ORAL | Status: DC | PRN
  Administered 2018-07-24 (×2): 50 mg via ORAL
  Filled 2018-07-23 (×2): qty 1

## 2018-07-23 MED ORDER — RAMELTEON 8 MG PO TABS
8.0000 mg | ORAL_TABLET | Freq: Every day | ORAL | Status: DC
  Administered 2018-07-23 – 2018-07-24 (×2): 8 mg via ORAL
  Filled 2018-07-23 (×4): qty 1

## 2018-07-23 MED ORDER — BENZTROPINE MESYLATE 0.5 MG PO TABS: 0.5000 mg | ORAL_TABLET | Freq: Two times a day (BID) | ORAL | Status: DC | PRN

## 2018-07-23 MED ORDER — LURASIDONE HCL 60 MG PO TABS
120.0000 mg | ORAL_TABLET | Freq: Every day | ORAL | Status: DC
  Administered 2018-07-24: 120 mg via ORAL
  Filled 2018-07-23 (×4): qty 2

## 2018-07-23 MED ORDER — LORAZEPAM 1 MG PO TABS: 1.0000 mg | ORAL_TABLET | Freq: Four times a day (QID) | ORAL | Status: DC | PRN

## 2018-07-23 MED ORDER — IOPAMIDOL (ISOVUE-370) INJECTION 76%
INTRAVENOUS | Status: AC
  Filled 2018-07-23: qty 100

## 2018-07-23 MED ORDER — GABAPENTIN 300 MG PO CAPS
300.0000 mg | ORAL_CAPSULE | Freq: Three times a day (TID) | ORAL | Status: DC
  Administered 2018-07-23 – 2018-07-25 (×6): 300 mg via ORAL
  Filled 2018-07-23 (×12): qty 1

## 2018-07-23 MED ORDER — HALOPERIDOL 5 MG PO TABS: 5.0000 mg | ORAL_TABLET | Freq: Four times a day (QID) | ORAL | Status: DC | PRN

## 2018-07-23 MED ORDER — ENOXAPARIN SODIUM 100 MG/ML ~~LOC~~ SOLN
90.0000 mg | SUBCUTANEOUS | Status: DC
  Filled 2018-07-23: qty 0.9

## 2018-07-23 MED ORDER — IOPAMIDOL (ISOVUE-370) INJECTION 76%
100.0000 mL | Freq: Once | INTRAVENOUS | Status: AC | PRN
  Administered 2018-07-23: 100 mL via INTRAVENOUS

## 2018-07-23 MED ORDER — MECLIZINE HCL 25 MG PO TABS
25.0000 mg | ORAL_TABLET | Freq: Once | ORAL | Status: AC
  Administered 2018-07-23: 25 mg via ORAL
  Filled 2018-07-23: qty 1

## 2018-07-23 MED ORDER — SODIUM CHLORIDE 0.9 % IJ SOLN
INTRAMUSCULAR | Status: AC
  Filled 2018-07-23: qty 50

## 2018-07-23 MED ORDER — ENOXAPARIN SODIUM 300 MG/3ML IJ SOLN: 1.5000 mg/kg | Freq: Once | INTRAMUSCULAR | Status: DC

## 2018-07-23 MED ORDER — HALOPERIDOL LACTATE 5 MG/ML IJ SOLN: 5.0000 mg | Freq: Four times a day (QID) | INTRAMUSCULAR | Status: DC | PRN

## 2018-07-23 MED ORDER — TOPIRAMATE 25 MG PO TABS
75.0000 mg | ORAL_TABLET | Freq: Every day | ORAL | Status: DC
  Administered 2018-07-23 – 2018-07-24 (×2): 75 mg via ORAL
  Filled 2018-07-23 (×5): qty 3

## 2018-07-23 MED ORDER — ENOXAPARIN SODIUM 100 MG/ML ~~LOC~~ SOLN
100.0000 mg | SUBCUTANEOUS | Status: DC
  Filled 2018-07-23: qty 1

## 2018-07-23 NOTE — Discharge Instructions (Addendum)
The cause of the swelling in your left leg, is not clear.  There is a mass in your left calf muscle which will need further evaluation.  We are referring you to an orthopedist who can help you, by evaluating it and possibly getting an MRI to determine its cause.  It appears to be safe to start using meclizine again for your vertigo.  Tell your psychiatrist at the behavioral health Hospital, that you are no longer hearing voices.
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
## 2018-07-23 NOTE — Progress Notes (Signed)
Recreation Therapy Notes  INPATIENT RECREATION THERAPY ASSESSMENT  Patient Details Name: [name] MRN: [id number] DOB: Jan 11, 1986 Today's Date: 07/23/2018       Information Obtained From: Patient  Able to Participate in Assessment/Interview: Yes  Patient Presentation: Alert  Reason for Admission (Per Patient): Suicidal Ideation, Other (Comments)(Hearing voices)  Patient Stressors: (None identified)  Coping Skills:   Journal, Isolation, Sports, TV, Music, Meditate, Deep Breathing, Substance Abuse, Talk, Avoidance, Read, Hot Bath/Shower  Leisure Interests (2+):  Individual - Reading, Individual - Other (Comment)(Cooking)  Frequency of Recreation/Participation: Other (Comment)(Daily)  Awareness of Community Resources:  Yes  Community Resources:  Library, Tax inspector  Current Use: Yes  If no, Barriers?:    Expressed Interest in [place]: No  [employer] of Residence:  Investment banker, corporate  Patient Main Form of Transportation: Diplomatic Services operational officer  Patient Strengths:  Faith; Will power to stay strong  Patient Identified Areas of Improvement:  Drinking; Spending money  Patient Goal for Hospitalization:  "I'm ready to go"  Current SI (including self-harm):  No  Current HI:  No  Current AVH: No  Staff Intervention Plan: Group Attendance, Collaborate with Interdisciplinary Treatment Team  Consent to Intern Participation: [name]    Victorino Sparrow, LRT/CTRS  Victorino Sparrow A 07/23/2018, 3:21 PM

## 2018-07-23 NOTE — ED Notes (Signed)
Pt to remain in ED until morning for ultrasound of left leg.

## 2018-07-23 NOTE — [facility] Group Notes (Signed)
[facility] LCSW Group Therapy Note  Date/Time: 07/23/18, 1315  Type of Therapy and Topic:  Group Therapy:  Overcoming Obstacles  Participation Level:  active  Description of Group:    In this group patients will be encouraged to explore what they see as obstacles to their own wellness and recovery. They will be guided to discuss their thoughts, feelings, and behaviors related to these obstacles. The group will process together ways to cope with barriers, with attention given to specific choices patients can make. Each patient will be challenged to identify changes they are motivated to make in order to overcome their obstacles. This group will be process-oriented, with patients participating in exploration of their own experiences as well as giving and receiving support and challenge from other group members.  Therapeutic Goals: 1. Patient will identify personal and current obstacles as they relate to admission. 2. Patient will identify barriers that currently interfere with their wellness or overcoming obstacles.  3. Patient will identify feelings, thought process and behaviors related to these barriers. 4. Patient will identify two changes they are willing to make to overcome these obstacles:    Summary of Patient Progress: Pt shared that substance abuse and alcohol were obstacles in his life.  Pt made several comments during group discussion and was attentive throughout.      Therapeutic Modalities:   Cognitive Behavioral Therapy Solution Focused Therapy Motivational Interviewing Relapse Prevention Therapy  Lurline Idol, LCSW

## 2018-07-23 NOTE — Plan of Care (Signed)
  Problem: Activity: Goal: Interest or engagement in activities will improve Outcome: Progressing   Problem: Safety: Goal: Periods of time without injury will increase Outcome: Progressing   Problem: Self-Concept: Goal: Level of anxiety will decrease Outcome: Progressing  DAR NOTE: Patient presents with flat affect and depressed mood.  Denies suicidal thoughts, auditory and visual hallucinations.  Described energy level as low and concentration as poor.  Returned from the ED to rule out DVT.  Result negative.  Recommended follow-up care with outpatient provider.  MD made aware of recommendations.  Vital signs result within normal limit.    Maintained on routine safety checks.  Medications given as prescribed.  Support and encouragement offered as needed.  Attended group and participated.  Patient visible in milieu with minimal interactions.  Offered no complaint.
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
## 2018-07-23 NOTE — ED Provider Notes (Signed)
9:35 AM.  Vascular imaging done negative for DVT left leg or right leg.  He does have adenopathy in the left groin.  He states that he has had pain and swelling in the left leg, for several months.  The patient denies current abscess or pain in the genitals or groin.  I discussed the findings with him and he prefers to have outpatient evaluation for the mass in his left calf region.  On my examination there is a soft indistinct mass with fullness of the left gastrocnemius muscle.  The mass is more medial than posterior or lateral.  There is no associated fluctuance or drainage.  The left groin is nontender to palpation, at this time.  Patient states he is chronically on meclizine, for vertigo and would like to have a dose at this time and be maintained on it.  He also states that he is no longer hearing the voices and feels like he is ready to be discharged home.  The patient understands that he is under IVC and will have to return to the behavioral health [facility], where they can consider discharge if appropriate.  I offered the patient to have further work-up of the left leg mass, here but he states he would prefer to see a specialist, orthopedist, to evaluate it as an outpatient.     Daleen Bo, MD 07/23/18 2015

## 2018-07-23 NOTE — ED Notes (Signed)
Pt being transported by [employer] at this time.

## 2018-07-23 NOTE — Progress Notes (Signed)
D: Pt denies SI/HI/AVH. Pt is pleasant and cooperative. Pt stated he was feeling better today. Pt visible on the milieu at times .   A: Pt was offered support and encouragement. Pt was given scheduled medications. Pt was encourage to attend groups. Q 15 minute checks were done for safety.   R:Pt attends groups and interacts well with peers and staff. Pt is taking medication. Pt has no complaints.Pt receptive to treatment and safety maintained on unit.   Problem: Education: Goal: Emotional status will improve Outcome: Progressing   Problem: Education: Goal: Mental status will improve Outcome: Progressing   Problem: Activity: Goal: Interest or engagement in activities will improve Outcome: Progressing   Problem: Activity: Goal: Sleeping patterns will improve Outcome: Progressing   Problem: Coping: Goal: Ability to verbalize frustrations and anger appropriately will improve Outcome: Progressing   Problem: Coping: Goal: Ability to demonstrate self-control will improve Outcome: Progressing   Problem: Safety: Goal: Periods of time without injury will increase Outcome: Progressing

## 2018-07-23 NOTE — ED Notes (Signed)
Phone report called to [facility], Benjamine Mola, Therapist, sports. Transportation arranged with [employer]

## 2018-07-23 NOTE — Progress Notes (Addendum)
*  Preliminary Results* Bilateral lower extremity venous duplex completed.  Bilateral lower extremities are negative for deep vein thrombosis.   There is evidence of superficial vein thrombosis involving a small segment of the right great saphenous vein at the distal calf.   There is evidence of right Baker's cyst, no left Baker's cyst.   Incidental finding: there are multiple heterogenous areas of the left groin with the largest measuring 4.8cm. These are suggestive of prominent inguinal lymph nodes.   Preliminary results discussed with Dr. Eulis Foster.  07/23/2018 8:55 AM Maudry Mayhew, MHA, RVT, RDCS, RDMS
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
## 2018-07-23 NOTE — ED Notes (Signed)
Pt in radiology at this time. 

## 2018-07-24 DIAGNOSIS — F17210 Nicotine dependence, cigarettes, uncomplicated: Secondary | ICD-10-CM

## 2018-07-24 DIAGNOSIS — R451 Restlessness and agitation: Secondary | ICD-10-CM

## 2018-07-24 MED ORDER — LURASIDONE HCL 60 MG PO TABS
120.0000 mg | ORAL_TABLET | Freq: Every day | ORAL | Status: DC
  Administered 2018-07-25: 120 mg via ORAL
  Filled 2018-07-24 (×3): qty 2
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
## 2018-07-24 NOTE — [facility] Suicide Risk Assessment (Signed)
[facility] INPATIENT:  Family/Significant Other Suicide Prevention Education  Suicide Prevention Education:  Education Completed; Ms [name], mother, [phone] has been identified by the patient as the family member/significant other with whom the patient will be residing, and identified as the person(s) who will aid the patient in the event of a mental health crisis (suicidal ideations/suicide attempt).  With written consent from the patient, the family member/significant other has been provided the following suicide prevention education, prior to the and/or following the discharge of the patient.  The suicide prevention education provided includes the following:  Suicide risk factors  Suicide prevention and interventions  National Suicide Hotline telephone number  [facility] assessment telephone number  [facility] Emergency Assistance [place] and/or Residential Mobile Crisis Unit telephone number  Request made of family/significant other to:  Remove weapons (e.g., guns, rifles, knives), all items previously/currently identified as safety concern.    Remove drugs/medications (over-the-counter, prescriptions, illicit drugs), all items previously/currently identified as a safety concern.  The family member/significant other verbalizes understanding of the suicide prevention education information provided.  The family member/significant other agrees to remove the items of safety concern listed above. She lives in [place], he in [place], but they talk daily.  She is mainly concerned about his drinking, feels he needs to stop or cut back.  Would like to see him enrolled in [place].  Trish Mage 07/24/2018, 2:54 PM

## 2018-07-24 NOTE — Plan of Care (Signed)
  Problem: Activity: Goal: Interest or engagement in activities will improve Outcome: Progressing   Problem: Safety: Goal: Periods of time without injury will increase Outcome: Progressing  DAR NOTE: Patient presents with anxious affect and depressed mood.  Denies pain, auditory and visual hallucinations.  Rates depression at 1, hopelessness at 0, and anxiety at 2.  Maintained on routine safety checks.  Medications given as prescribed.  Support and encouragement offered as needed.  Attended group and participated.  States goal for today is "going home."  Patient visible in milieu with minimal interactions.    Patient requested and received Vistaril 50 mg for complain of anxiety with good effect.  Patient is safe on and off the unit.

## 2018-07-24 NOTE — Progress Notes (Signed)
Recreation Therapy Notes  Date: 10.15.19 Time: 1000 Location: 500 Hall Dayroom  Group Topic: Wellness  Goal Area(s) Addresses:  Patient will define components of whole wellness. Patient will verbalize benefit of whole wellness.  Behavioral Response: Minimal  Intervention: Music, Exercise  Activity: Exercise.  LRT led patients in a series of stretches to get them warmed up and loose.  Each patient would then lead the group in an exercise of their choice.  The group will complete 3 to 4 rounds of exercise.  Patients could take water breaks as needed.  Education: Wellness, Dentist.   Education Outcome: Acknowledges education/In group clarification offered/Needs additional education.   Clinical Observations/Feedback: Pt completed some of the exercises and observed his peers as they completed the exercises.  Pt was bright and smiling throughout group.    Victorino Sparrow, LRT/CTRS      [name], Lajoya Dombek A 07/24/2018 11:28 AM

## 2018-07-24 NOTE — Progress Notes (Signed)
Adult Psychoeducational Group Note  Date:  07/24/2018 Time:  9:26 PM  Group Topic/Focus:  Wrap-Up Group:   The focus of this group is to help patients review their daily goal of treatment and discuss progress on daily workbooks.  Participation Level:  Active  Participation Quality:  Appropriate  Affect:  Appropriate  Cognitive:  Appropriate  Insight: Appropriate  Engagement in Group:  Engaged  Modes of Intervention:  Discussion  Additional Comments:  Patient attended group and said that his day was a 6.  His goal for today was to discuss his discharge plan and he did . Patient said he will be discharged tomorrow.   [name] W Rondi Ivy [birth year], 9:26 PM

## 2018-07-24 NOTE — [facility] Group Notes (Signed)
LCSW Group Therapy Note  07/24/2018 1:15pm  Type of Therapy/Topic:  Group Therapy:  Feelings about Diagnosis  Participation Level:  Active   Description of Group:   This group will allow patients to explore their thoughts and feelings about diagnoses they have received. Patients will be guided to explore their level of understanding and acceptance of these diagnoses. Facilitator will encourage patients to process their thoughts and feelings about the reactions of others to their diagnosis and will guide patients in identifying ways to discuss their diagnosis with significant others in their lives. This group will be process-oriented, with patients participating in exploration of their own experiences, giving and receiving support, and processing challenge from other group members.   Therapeutic Goals: 1. Patient will demonstrate understanding of diagnosis as evidenced by identifying two or more symptoms of the disorder 2. Patient will be able to express two feelings regarding the diagnosis 3. Patient will demonstrate their ability to communicate their needs through discussion and/or role play  Summary of Patient Progress:       Therapeutic Modalities:   Cognitive Behavioral Therapy Brief Therapy Feelings Identification    Trish Mage, LCSW 07/24/2018 2:14 PM

## 2018-07-24 NOTE — Progress Notes (Signed)
D: Pt denies SI/HI/AVH. Pt is pleasant and cooperative. Pt stated he was feeling good, pt said he was anticipating D/C tomorrow. Pt was visible in the dayroom with peers watching TV. Pt requested Vistaril per [facility] with his night medications.  A: Pt was offered support and encouragement. Pt was given scheduled medications. Pt was encourage to attend groups. Q 15 minute checks were done for safety.  R:Pt attends groups and interacts well with peers and staff. Pt is taking medication. Pt has no complaints.Pt receptive to treatment and safety maintained on unit.  Problem: Health Behavior/Discharge Planning: Goal: Compliance with therapeutic regimen will improve Outcome: Progressing   Problem: Safety: Goal: Ability to disclose and discuss suicidal ideas will improve Outcome: Progressing   Problem: Self-Concept: Goal: Will verbalize positive feelings about self Outcome: Progressing   Problem: Self-Concept: Goal: Level of anxiety will decrease Outcome: Progressing   Problem: Activity: Goal: Will verbalize the importance of balancing activity with adequate rest periods Outcome: Progressing

## 2018-07-25 MED ORDER — GABAPENTIN 300 MG PO CAPS: 300.0000 mg | ORAL_CAPSULE | Freq: Three times a day (TID) | ORAL | 1 refills | Status: DC

## 2018-07-25 MED ORDER — RAMELTEON 8 MG PO TABS: 8.0000 mg | ORAL_TABLET | Freq: Every day | ORAL | 0 refills | Status: DC

## 2018-07-25 MED ORDER — HYDROXYZINE HCL 50 MG PO TABS: 50.0000 mg | ORAL_TABLET | Freq: Four times a day (QID) | ORAL | 0 refills | Status: DC | PRN

## 2018-07-25 NOTE — Progress Notes (Signed)
Recreation Therapy Notes  INPATIENT RECREATION TR PLAN  Patient Details Name: [name] MRN: [id number] DOB: [birth year] Today's Date: 07/25/2018  Rec Therapy Plan Is patient appropriate for Therapeutic Recreation?: Yes Treatment times per week: about 3 days Estimated Length of Stay: 5-7 days TR Treatment/Interventions: Group participation (Comment)  Discharge Criteria Pt will be discharged from therapy if:: Discharged Treatment plan/goals/alternatives discussed and agreed upon by:: Patient/family  Discharge Summary Short term goals set: See patient care plan Short term goals met: Adequate for discharge Progress toward goals comments: Groups attended Which groups?: Wellness, Communication Reason goals not met: None Therapeutic equipment acquired: [name] Reason patient discharged from therapy: Discharge from hospital Pt/family agrees with progress & goals achieved: Yes Date patient discharged from therapy: 07/25/18    Victorino Sparrow, LRT/CTRS  [name], [name] 07/25/2018, 12:46 PM

## 2018-07-25 NOTE — Progress Notes (Signed)
  [facility] Adult Case Management Discharge Plan :  Will you be returning to the same living situation after discharge:  Yes,  home At discharge, do you have transportation home?: Yes,  bus pass Do you have the ability to pay for your medications: Yes,  insurance  Release of information consent forms completed and in the chart;  Patient's signature needed at discharge.  Patient to Follow up at: Follow-up Information    Schedule an appointment as soon as possible for a visit  with Paralee Cancel, MD.   Specialty:  Orthopedic Surgery Why:  To be seen for evaluation of the swelling in the left calf muscle. Contact information: [address] [place] [address] [phone]        [facility] Follow up on 08/23/2018.   Why:  Thursday at 4:30 with Dr Georgiann Mccoy information: [address] [place]: [place], [place] Follow up on 07/31/2018.   Specialty:  Behavioral Health Why:  Tuesday at [place] your appointment with Remo Lipps Ringer for [facility] information: [place] [address] [phone]           Next level of care provider has access to [place] and Suicide Prevention discussed: Yes,  yes     Has patient been referred to the Quitline?: Patient refused referral  Patient has been referred for addiction treatment: Yes  Trish Mage, LCSW 07/25/2018, 9:26 AM

## 2018-07-25 NOTE — Plan of Care (Signed)
Pt was able to identify some positive coping strategies at completion of recreation therapy group sessions.   Victorino Sparrow, LRT/CTRS

## 2018-07-25 NOTE — Discharge Summary (Signed)
Physician Discharge Summary Note  Patient:  [name] is an 32 y.o., male MRN:  [id number] DOB:  March 31, 1986 Patient phone:  [phone] (home)  Patient address:   [address] [place] [address],  Total Time spent with patient: 30 minutes  Date of Admission:  07/22/2018 Date of Discharge: 07/25/2018  Reason for Admission: worsened AH, VH, SI, and alcohol use  Principal Problem: Schizoaffective disorder, depressive type [facility]) Discharge Diagnoses: Patient Active Problem List   Diagnosis Date Noted  . PTSD (post-traumatic stress disorder) [F43.10] 07/23/2018  . Dizziness and giddiness [R42] 02/01/2016  . Migraine headache [G43.909] 02/01/2016  . Schizoaffective disorder, depressive type ([place]) [F25.1] 05/13/2015  . Severe alcohol dependence ([place]) [F10.20] 05/13/2015  . Suicidal ideation [R45.851] 01/12/2014    Past Psychiatric History: see H&P  Past Medical History:  Past Medical History:  Diagnosis Date  . Bipolar 1 disorder ([place])   . Depression   . Dizziness and giddiness 02/01/2016  . Herpes genitalia   . HIV disease ([place])   . Hypertension   . Migraine headache 02/01/2016  . PTSD (post-traumatic stress disorder)   . Schizoaffective disorder ([place])   . Seizures ([place])     Past Surgical History:  Procedure Laterality Date  . BACK SURGERY    . HAND SURGERY     Family History:  Family History  Problem Relation Age of Onset  . Alcohol abuse Mother   . Schizophrenia Father   . Depression Father   . Alcohol abuse Father   . Alcohol abuse Paternal Uncle   . Alcohol abuse Paternal Uncle    Family Psychiatric  History: see H&P Social History:  Social History   Substance and Sexual Activity  Alcohol Use Yes  . Alcohol/week: 14.0 standard drinks  . Types: 14 Cans of beer per week   Comment: 5-6 40oz per day     Social History   Substance and Sexual Activity  Drug Use No    Social History   Socioeconomic History  . Marital status: Single   Spouse name: Not on file  . Number of children: Not on file  . Years of education: Not on file  . Highest education level: Not on file  Occupational History  . Occupation: [employer]  Social Needs  . Financial resource strain: Not on file  . Food insecurity:    Worry: Not on file    Inability: Not on file  . Transportation needs:    Medical: Not on file    Non-medical: Not on file  Tobacco Use  . Smoking status: Current Every Day Smoker    Packs/day: 1.00    Types: Cigarettes  . Smokeless tobacco: Never Used  Substance and Sexual Activity  . Alcohol use: Yes    Alcohol/week: 14.0 standard drinks    Types: 14 Cans of beer per week    Comment: 5-6 40oz per day  . Drug use: No  . Sexual activity: Not Currently    Birth control/protection: Condom  Lifestyle  . Physical activity:    Days per week: Not on file    Minutes per session: Not on file  . Stress: Not on file  Relationships  . Social connections:    Talks on phone: Not on file    Gets together: Not on file    Attends religious service: Not on file    Active member of club or organization: Not on file    Attends meetings of clubs or organizations: Not on  file    Relationship status: Not on file  Other Topics Concern  . Not on file  Social History Narrative   Lives at home alone   Right-handed   Drinks 2-3 sodas per day    Hospital Course:    Kairos Secrist is a 32 y/o M with history of schizophrenia (elsewhere schizoaffective disorder depressive type) who was admitted to [facility] on IVC initiated by mobile crisis team after pt had reported SI with plan to overdose, CAH to overdose, VH, worsening alcohol use, and poor adherence to his outpatient medication regimen. Pt was medically cleared and then transferred to [facility] for additional treatment and stabilization. Pt had pain in his calf area, and he was evaluated in ED for suspicion of DVT, and he was medically cleared again with plan for him to have outpatient follow for  his leg complaint. Upon arrival to [facility] pt was continued on home medications of latuda (he had not been taking with calories) and lamictal. He was also discontinued from xanax and started on trial of vistaril. He was started on trial of rozerem for sleep in addition to home medication of trazodone. He had been drinking about 6-10 beers per day at home, and he was monitored for withdrawal, but he had no symptoms during his stay. He had improvement of his presenting symptoms.  Today upon evaluation, pt shares, "I'm doing good." He denies any specific concerns. He is sleeping well. His appetite is good. He denies other physical complaints. He denies SI/HI/AH/VH. He is tolerating his medications well, and he is in agreement to continue his current regimen without changes. We discussed importance of not resuming xanax or other benzodiazepines after discharge due to his recent struggles with alcohol use. Pt plans to follow up at [facility] for alcohol use treatment, and he will continue with his previous outpatient provider, Dr. Wonda Amis after discharge. He was able to engage in safety planning including plan to return to [facility] or contact emergency services if he feels unable to maintain his own safety or the safety of others. Pt had no further questions, comments, or concerns.   Physical Findings: AIMS: Facial and Oral Movements Muscles of Facial Expression: None, normal Lips and Perioral Area: None, normal Jaw: None, normal Tongue: None, normal,Extremity Movements Upper (arms, wrists, hands, fingers): None, normal Lower (legs, knees, ankles, toes): None, normal, Trunk Movements Neck, shoulders, hips: None, normal, Overall Severity Severity of abnormal movements (highest score from questions above): None, normal Incapacitation due to abnormal movements: None, normal Patient's awareness of abnormal movements (rate only patient's report): No Awareness, Dental Status Current problems with teeth and/or  dentures?: No Does patient usually wear dentures?: No  CIWA:    COWS:     Musculoskeletal: Strength & Muscle Tone: within normal limits Gait & Station: normal Patient leans: [name]  Psychiatric Specialty Exam: Physical Exam  Nursing note and vitals reviewed.   Review of Systems  Constitutional: Negative for chills and fever.  Respiratory: Negative for cough and shortness of breath.   Cardiovascular: Negative for chest pain.  Gastrointestinal: Negative for abdominal pain, heartburn, nausea and vomiting.  Psychiatric/Behavioral: Negative for depression, hallucinations and suicidal ideas. The patient is not nervous/anxious and does not have insomnia.     Blood pressure 123/84, pulse (!) 103, temperature 97.8 F (36.6 C), temperature source Oral, resp. rate 20, height 6' 3"  (1.905 m), weight 127 kg, SpO2 98 %.Body mass index is 35 kg/m.  General Appearance: Casual and Fairly Groomed  Eye Contact:  Good  Speech:  Clear and Coherent and Normal Rate  Volume:  Normal  Mood:  Euthymic  Affect:  Appropriate and Congruent  Thought Process:  Coherent and Goal Directed  Orientation:  Full (Time, Place, and Person)  Thought Content:  Logical  Suicidal Thoughts:  No  Homicidal Thoughts:  No  Memory:  Immediate;   Fair Recent;   Fair Remote;   Fair  Judgement:  Poor  Insight:  Lacking  Psychomotor Activity:  Normal  Concentration:  Concentration: Fair  Recall:  [employer] of Knowledge:  Fair  Language:  Fair  Akathisia:  No  Handed:    AIMS (if indicated):     Assets:  Resilience Social Support  ADL's:  Intact  Cognition:  WNL  Sleep:  Number of Hours: 4.25        Has this patient used any form of tobacco in the last 30 days? (Cigarettes, Smokeless Tobacco, Cigars, and/or Pipes) Yes, Yes, A prescription for an FDA-approved tobacco cessation medication was offered at discharge and the patient refused  Blood Alcohol level:  Lab Results  Component Value Date   ETH 257 (H)  07/22/2018   ETH 192 (H) 76/54/6503    Metabolic Disorder Labs:  No results found for: HGBA1C, MPG No results found for: PROLACTIN No results found for: CHOL, TRIG, HDL, CHOLHDL, VLDL, LDLCALC  See Psychiatric Specialty Exam and Suicide Risk Assessment completed by Attending Physician prior to discharge.  Discharge destination:  Home  Is patient on multiple antipsychotic therapies at discharge:  No   Has Patient had three or more failed trials of antipsychotic monotherapy by history:  No  Recommended Plan for Multiple Antipsychotic Therapies: NA   Allergies as of 07/25/2018   No Known Allergies     Medication List    STOP taking these medications   ALPRAZolam 1 MG tablet Commonly known as:  XANAX   cloNIDine 0.1 MG tablet Commonly known as:  CATAPRES   cyclobenzaprine 10 MG tablet Commonly known as:  FLEXERIL   folic acid 1 MG tablet Commonly known as:  FOLVITE   haloperidol 5 MG tablet Commonly known as:  HALDOL   meclizine 25 MG tablet Commonly known as:  ANTIVERT   Nicotine 21-14-7 MG/24HR Kit   NORCO 5-325 MG tablet Generic drug:  HYDROcodone-acetaminophen   ondansetron 8 MG tablet Commonly known as:  ZOFRAN   PARoxetine 40 MG tablet Commonly known as:  PAXIL   promethazine 25 MG tablet Commonly known as:  PHENERGAN   rizatriptan 10 MG disintegrating tablet Commonly known as:  MAXALT-MLT   VITAMIN B-1 PO   zolpidem 10 MG tablet Commonly known as:  AMBIEN     TAKE these medications     Indication  albuterol 108 (90 Base) MCG/ACT inhaler Commonly known as:  PROVENTIL HFA;VENTOLIN HFA Inhale 2 puffs into the lungs every 6 (six) hours as needed for wheezing or shortness of breath.  Indication:  Asthma   bictegravir-emtricitabine-tenofovir AF 50-200-25 MG Tabs tablet Commonly known as:  BIKTARVY Take 1 tablet by mouth daily.  Indication:  HIV Disease   cetirizine 10 MG tablet Commonly known as:  ZYRTEC Take 1 tablet (10 mg total) by  mouth daily.  Indication:  Hayfever   gabapentin 300 MG capsule Commonly known as:  NEURONTIN Take 1 capsule (300 mg total) by mouth 3 (three) times daily. What changed:  when to take this  Indication:  anxiety   hydrOXYzine 50 MG tablet Commonly known as:  ATARAX/VISTARIL Take 1 tablet (50 mg total) by mouth every 6 (six) hours as needed for anxiety. What changed:    medication strength  how much to take  when to take this  reasons to take this  Indication:  Feeling Anxious   lamoTRIgine 200 MG tablet Commonly known as:  LAMICTAL Take 1 tablet (200 mg total) by mouth every evening.  Indication:  Schizophrenia   LINZESS 145 MCG Caps capsule Generic drug:  linaclotide Take 145 mcg by mouth daily as needed (constipation).  Indication:  Chronic Constipation of Unknown Cause   Lurasidone HCl 120 MG Tabs Take 1 tablet (120 mg total) by mouth daily.  Indication:  Schizophrenia   multivitamin with minerals Tabs tablet Take 1 tablet by mouth daily.  Indication:  Vitamin Supplementation   pantoprazole 20 MG tablet Commonly known as:  PROTONIX Take 20 mg by mouth daily.  Indication:  Gastroesophageal Reflux Disease   ramelteon 8 MG tablet Commonly known as:  ROZEREM Take 1 tablet (8 mg total) by mouth at bedtime.  Indication:  Trouble Sleeping   topiramate 25 MG tablet Commonly known as:  TOPAMAX TAKE 3 TABLETS (75 MG TOTAL) BY MOUTH AT BEDTIME.  Indication:  Migraine Headache   trazodone 300 MG tablet Commonly known as:  DESYREL Take 1 tablet (300 mg total) by mouth at bedtime as needed for sleep. What changed:  when to take this  Indication:  Trouble Sleeping   valACYclovir 1000 MG tablet Commonly known as:  VALTREX Take 1 tablet (1,000 mg total) by mouth daily.  Indication:  Genital Herpes      Follow-up Information    Schedule an appointment as soon as possible for a visit  with Paralee Cancel, MD.   Specialty:  Orthopedic Surgery Why:  To be seen for  evaluation of the swelling in the left calf muscle. Contact information: [address] [place] [address] [phone]        [facility] Follow up on 08/23/2018.   Why:  Thursday at 4:30 with Dr Georgiann Mccoy information: [address] [place]: [place] Follow up on 07/31/2018.   Specialty:  Behavioral Health Why:  Tuesday at [place] your appointment with Clarita Crane for [facility] information: [place] [address] [phone]           Follow-up recommendations:  Activity:  as tolerated Diet:  normal Tests:  NA Other:  see above for DC plan  Comments:    Signed: Pennelope Bracken, MD 07/25/2018, 10:40 AM

## 2018-07-25 NOTE — Progress Notes (Signed)
Recreation Therapy Notes  Date: 10.16.19 Time: 1000 Location: 500 Hall Dayroom  Group Topic: Communication  Goal Area(s) Addresses:  Patient will effectively communicate with peers in group.  Patient will verbalize benefit of healthy communication. Patient will verbalize positive effect of healthy communication on post d/c goals.   Behavioral Response: Engaged  Intervention: Engineer, civil (consulting), pencils, blank paper  Activity: Back to Back Drawings.  Patients were paired off into groups of two. Patients are seated back to back.  One person in each group was given a picture to describe to their partner.  The partner is to draw the picture as it is described to them.  The person drawing could not ask any questions of the person giving the instructions.  Once finished, partners will compare pictures and then switch roles.  LRT will then give each group a different picture.  Education: Communication, Discharge Planning  Education Outcome: Acknowledges understanding/In group clarification offered/Needs additional education.   Clinical Observations/Feedback:  Pt was bright and smiling during group.  Pt stated he partner didn't describe the picture right.  Pt also stated the hardest part for him was "drawing the picture".  Pt expressed when he was giving the instructions, his partner "did close to what I said".    Victorino Sparrow, LRT/CTRS     Victorino Sparrow A 07/25/2018 12:17 PM

## 2018-07-25 NOTE — Progress Notes (Signed)
Patient discharged to home/self care on his own accord.  Patient denies SI, HI and AVH upon discharge.  Patient was able to contract for safety and was eager to discharge.   Patient acknowledged understanding of all discharge instructions and receipt of all personal belongings.

## 2018-07-25 NOTE — BHH Suicide Risk Assessment (Signed)
[facility] Discharge Suicide Risk Assessment   Principal Problem: Schizoaffective disorder, depressive type [facility]) Discharge Diagnoses:  Patient Active Problem List   Diagnosis Date Noted  . PTSD (post-traumatic stress disorder) [F43.10] 07/23/2018  . Dizziness and giddiness [R42] 02/01/2016  . Migraine headache [G43.909] 02/01/2016  . Schizoaffective disorder, depressive type ([place]) [F25.1] 05/13/2015  . Severe alcohol dependence ([place]) [F10.20] 05/13/2015  . Suicidal ideation [R45.851] 01/12/2014    Total Time spent with patient: 30 minutes  Musculoskeletal: Strength & Muscle Tone: within normal limits Gait & Station: normal Patient leans: [name]  Psychiatric Specialty Exam: Review of Systems  Constitutional: Negative for chills and fever.  Respiratory: Negative for cough and shortness of breath.   Cardiovascular: Negative for chest pain.  Gastrointestinal: Negative for abdominal pain, heartburn, nausea and vomiting.  Psychiatric/Behavioral: Negative for depression, hallucinations and suicidal ideas. The patient is not nervous/anxious and does not have insomnia.     Blood pressure 123/84, pulse (!) 103, temperature 97.8 F (36.6 C), temperature source Oral, resp. rate 20, height 6' 3"  (1.905 m), weight 127 kg, SpO2 98 %.Body mass index is 35 kg/m.  General Appearance: Casual and Fairly Groomed  Eye Contact::  Good  Speech:  Clear and Coherent and Normal Rate  Volume:  Normal  Mood:  Euthymic  Affect:  Appropriate, Blunt and Constricted  Thought Process:  Coherent and Goal Directed  Orientation:  Full (Time, Place, and Person)  Thought Content:  Logical  Suicidal Thoughts:  No  Homicidal Thoughts:  No  Memory:  Immediate;   Good Recent;   Good Remote;   Good  Judgement:  Good  Insight:  Good  Psychomotor Activity:  Normal  Concentration:  Good  Recall:  Good  Fund of Knowledge:Fair  Language: Fair  Akathisia:  No  Handed:    AIMS (if indicated):     Assets:  Communication  Skills Resilience Social Support  Sleep:  Number of Hours: 4.25  Cognition: WNL  ADL's:  Intact   Mental Status Per Nursing Assessment::   On Admission:  Suicidal ideation indicated by patient, Self-harm thoughts, Self-harm behaviors, Suicide plan, Intention to act on suicide plan, Belief that plan would result in death  Demographic Factors:  Male, Low socioeconomic status, Living alone and Unemployed  Loss Factors: Financial problems/change in socioeconomic status  Historical Factors: Family history of mental illness or substance abuse and Impulsivity  Risk Reduction Factors:   Sense of responsibility to family, Positive social support, Positive therapeutic relationship and Positive coping skills or problem solving skills  Continued Clinical Symptoms:  Depression:   Comorbid alcohol abuse/dependence Severe Alcohol/Substance Abuse/Dependencies Schizophrenia:   Less than 78 years old Paranoid or undifferentiated type  Cognitive Features That Contribute To Risk:  None    Suicide Risk:  Minimal: No identifiable suicidal ideation.  Patients presenting with no risk factors but with morbid ruminations; may be classified as minimal risk based on the severity of the depressive symptoms  Follow-up Information    Schedule an appointment as soon as possible for a visit  with Paralee Cancel, MD.   Specialty:  Orthopedic Surgery Why:  To be seen for evaluation of the swelling in the left calf muscle. Contact information: [address] [place] [address] [phone]        [facility] Follow up on 08/23/2018.   Why:  Thursday at 4:30 with Dr Georgiann Mccoy information: [place] [address] [place]: [phone]  [facility] Follow up on 07/31/2018.   Specialty:  Behavioral Health Why:  Tuesday at [place] your appointment with Remo Lipps [facility] for [facility] information: [place]  [place] [address] [phone]         Subjective Data:  [name] is a 32 y/o M with history of schizophrenia (elsewhere schizoaffective disorder depressive type) who was admitted to [facility] on IVC initiated by mobile crisis team after pt had reported SI with plan to overdose, CAH to overdose, VH, worsening alcohol use, and poor adherence to his outpatient medication regimen. Pt was medically cleared and then transferred to [facility] for additional treatment and stabilization. Pt had pain in his calf area, and he was evaluated in ED for suspicion of DVT, and he was medically cleared again with plan for him to have outpatient follow for his leg complaint. Upon arrival to [facility] pt was continued on home medications of latuda (he had not been taking with calories) and lamictal. He was also discontinued from xanax and started on trial of vistaril. He was started on trial of rozerem for sleep in addition to home medication of trazodone. He had been drinking about 6-10 beers per day at home, and he was monitored for withdrawal, but he had no symptoms during his stay. He had improvement of his presenting symptoms.  Today upon evaluation, pt shares, "I'm doing good." He denies any specific concerns. He is sleeping well. His appetite is good. He denies other physical complaints. He denies SI/HI/AH/VH. He is tolerating his medications well, and he is in agreement to continue his current regimen without changes. We discussed importance of not resuming xanax or other benzodiazepines after discharge due to his recent struggles with alcohol use. Pt plans to follow up at [facility] for alcohol use treatment, and he will continue with his previous outpatient provider, Dr. Wonda Amis after discharge. He was able to engage in safety planning including plan to return to [facility] or contact emergency services if he feels unable to maintain his own safety or the safety of others. Pt had no further questions, comments, or concerns.    Plan  Of Care/Follow-up recommendations:    -Discharge to outpatient level of care  -Schizoaffective disorder, depressive type   -Continue Latuda 144m po qAM with breakfast   -Continue lamictal 2060mpo qAM  -Anxiety   -Continue gabapentin 30046mo TID   -Continue vistaril 14m14m q6h prn anxiety  -Insomnia  -Continue rozerem 8mg 14mqhs   -Continue trazodone 300mg 71mhs  -Migraine   -Continue topamax 75mg p35may  -HIV+  -Continue Biktarvy 1 tablet per day  Activity:  as tolerated Diet:  normal Tests:  NA Other:  see above for DC plan  ChristoPennelope Bracken/16/2019, 10:38 AM

## 2018-08-01 ENCOUNTER — Ambulatory Visit
Admission: RE | Admit: 2018-08-01 | Discharge: 2018-08-01 | Disposition: A | Discharge status: Home / Self Care | Payer: Medicare HMO | Source: Ambulatory Visit | Attending: Gastroenterology | Admitting: Gastroenterology

## 2018-08-01 DIAGNOSIS — R112 Nausea with vomiting, unspecified: Secondary | ICD-10-CM

## 2018-08-01 DIAGNOSIS — R7989 Other specified abnormal findings of blood chemistry: Secondary | ICD-10-CM

## 2018-08-01 DIAGNOSIS — R945 Abnormal results of liver function studies: Secondary | ICD-10-CM

## 2018-08-21 ENCOUNTER — Other Ambulatory Visit (HOSPITAL_COMMUNITY): Payer: Self-pay

## 2018-08-27 ENCOUNTER — Other Ambulatory Visit (HOSPITAL_COMMUNITY): Payer: Self-pay | Admitting: Internal Medicine

## 2018-08-27 ENCOUNTER — Ambulatory Visit
Admission: RE | Admit: 2018-08-27 | Discharge: 2018-08-27 | Disposition: A | Discharge status: Home / Self Care | Payer: Medicare HMO | Source: Ambulatory Visit | Attending: Gastroenterology | Admitting: Gastroenterology

## 2018-08-27 DIAGNOSIS — R112 Nausea with vomiting, unspecified: Secondary | ICD-10-CM

## 2018-08-27 DIAGNOSIS — R748 Abnormal levels of other serum enzymes: Secondary | ICD-10-CM

## 2018-08-27 DIAGNOSIS — R600 Localized edema: Secondary | ICD-10-CM

## 2018-08-27 MED ORDER — GADOBENATE DIMEGLUMINE 529 MG/ML IV SOLN
20.0000 mL | Freq: Once | INTRAVENOUS | Status: AC | PRN
  Administered 2018-08-27: 20 mL via INTRAVENOUS

## 2018-09-03 ENCOUNTER — Ambulatory Visit (HOSPITAL_COMMUNITY)
Admission: RE | Admit: 2018-09-03 | Discharge: 2018-09-03 | Disposition: A | Discharge status: Home / Self Care | Payer: Medicare HMO | Source: Ambulatory Visit | Attending: Internal Medicine | Admitting: Internal Medicine

## 2018-09-03 DIAGNOSIS — R600 Localized edema: Secondary | ICD-10-CM | POA: Diagnosis not present

## 2018-09-03 NOTE — Progress Notes (Signed)
  Echocardiogram 2D Echocardiogram has been performed.  [name] 09/03/2018, 3:22 PM

## 2018-12-28 ENCOUNTER — Other Ambulatory Visit: Payer: Self-pay | Admitting: Orthopedic Surgery

## 2018-12-28 DIAGNOSIS — M7989 Other specified soft tissue disorders: Secondary | ICD-10-CM

## 2019-01-23 ENCOUNTER — Other Ambulatory Visit: Payer: Self-pay

## 2019-01-23 ENCOUNTER — Ambulatory Visit
Admission: RE | Admit: 2019-01-23 | Discharge: 2019-01-23 | Disposition: A | Discharge status: Home / Self Care | Payer: Medicare HMO | Source: Ambulatory Visit | Attending: Orthopedic Surgery | Admitting: Orthopedic Surgery

## 2019-01-23 DIAGNOSIS — M7989 Other specified soft tissue disorders: Secondary | ICD-10-CM

## 2019-01-23 MED ORDER — GADOBENATE DIMEGLUMINE 529 MG/ML IV SOLN
20.0000 mL | Freq: Once | INTRAVENOUS | Status: AC | PRN
  Administered 2019-01-23: 15:00:00 20 mL via INTRAVENOUS

## 2019-02-04 ENCOUNTER — Telehealth: Payer: Self-pay | Admitting: Neurology

## 2019-02-04 NOTE — Telephone Encounter (Signed)
Noted, pre charting will be completed closer to video visit.

## 2019-02-04 NOTE — Telephone Encounter (Signed)
Pt called in to cancel appt on 02/06/2019 advised pt we could offer a televisit or a web visit for him to refrain from a delay of care. Pt gave consent for televisit

## 2019-02-05 NOTE — Telephone Encounter (Signed)
Pt returned my call and pre charting for 02/06/19 visit has been completed. Allergies, pharm and PMH have been update.  Pt provided consent for video visit to be completed via doxy me. Text with confirmation has been send to [phone].

## 2019-02-05 NOTE — Addendum Note (Signed)
Addended by: Verlin Grills T on: 02/05/2019 09:12 AM   Modules accepted: Orders

## 2019-02-05 NOTE — Telephone Encounter (Signed)
LVM for pt to call back and complete pre charting.

## 2019-02-06 ENCOUNTER — Other Ambulatory Visit: Payer: Self-pay

## 2019-02-06 ENCOUNTER — Ambulatory Visit (INDEPENDENT_AMBULATORY_CARE_PROVIDER_SITE_OTHER): Payer: Medicare HMO | Admitting: Neurology

## 2019-02-06 ENCOUNTER — Encounter: Payer: Self-pay | Admitting: Neurology

## 2019-02-06 DIAGNOSIS — G43719 Chronic migraine without aura, intractable, without status migrainosus: Secondary | ICD-10-CM | POA: Diagnosis not present

## 2019-02-06 MED ORDER — TOPIRAMATE 100 MG PO TABS: 100.0000 mg | ORAL_TABLET | Freq: Every day | ORAL | 1 refills | Status: DC

## 2019-02-06 NOTE — Progress Notes (Signed)
     Virtual Visit via Video Note  I connected with [name] on 02/06/19 at  3:30 PM EDT by a video enabled telemedicine application and verified that I am speaking with the correct person using two identifiers.  Location: Patient: Patient home. Provider: Physician in office.   I discussed the limitations of evaluation and management by telemedicine and the availability of in person appointments. The patient expressed understanding and agreed to proceed.  History of Present Illness: Mr. Mika is a 33 year old right-handed black male with a history of schizoaffective disorder and a history of frequent headaches.  The patient has had prior history of alcohol overuse, he is still drinking at least 2 beers a day.  The patient has had daily headaches in the past.  He indicates that he has not been able to tolerate the gabapentin he was placed on and stop the medication.  He remains on Topamax 25 mg at night.  He has started taking alprazolam 2 mg 3 times daily, he claims that his Onfi takes his Topamax and alprazolam on a regular basis he has fewer headaches.  He now averages 2 or 3 headaches a week, he may occasionally take Tylenol or Advil for the headache with some benefit.  The has had some swelling in the calf muscle on the left for several months with some pain with walking.  He has seen a rheumatology doctor who ordered MRI of the leg which shows inflammatory changes in the muscle that could be consistent with polymyositis.  The patient however notes focal changes in the leg.   Observations/Objective: The evaluation today reveals that the patient is alert and cooperative, his speech is well enunciated, not aphasic or dysarthric.  He is answering questions appropriately.  Face is symmetric.  Extraocular movements are full.  There does appear to be some enlargement of the calf muscle on the left.  Assessment and Plan: 1.  History of frequent headache  2.  Schizoaffective disorder  3.   Left calf swelling and pain  The patient will increase the Topamax 200 mg at night.  His headaches have improved some on more regular dosing of this medication and with alprazolam.  The patient will follow-up in 3 to 4 months.  The patient is followed through rheumatology, he may at some point require EMG evaluation of the leg muscle.  Follow Up Instructions: 106-monthfollow-up, may see nurse practitioner.   I discussed the assessment and treatment plan with the patient. The patient was provided an opportunity to ask questions and all were answered. The patient agreed with the plan and demonstrated an understanding of the instructions.   The patient was advised to call back or seek an in-person evaluation if the symptoms worsen or if the condition fails to improve as anticipated.  I provided 20 minutes of non-face-to-face time during this encounter.   CKathrynn Ducking MD

## 2019-05-16 ENCOUNTER — Telehealth: Payer: Self-pay | Admitting: Neurology

## 2019-05-16 NOTE — Telephone Encounter (Signed)
Dottie from Dr Melvyn Neth office([facility] office) states pt had a biopsy done today because of low extreme swelling, Dr states Dr Jannifer Franklin may want to f/u with pt as a result of the biopsy.

## 2019-05-23 NOTE — Telephone Encounter (Signed)
I have checked several times, unable to find the surgical pathology report.  This was not ordered through this office, the patient apparently is followed through rheumatology.  I will see the patient back on 01 July 2019, I will check the report of the surgical pathology at that time.

## 2019-06-11 ENCOUNTER — Ambulatory Visit: Payer: Medicare HMO | Admitting: Neurology

## 2019-06-19 ENCOUNTER — Other Ambulatory Visit: Payer: Self-pay | Admitting: Neurology

## 2019-06-30 NOTE — Progress Notes (Signed)
Virtual Visit via Video Note  I connected with [place] on 06/30/19 at  3:45 PM EDT by a video enabled telemedicine application and verified that I am speaking with the correct person using two identifiers.  Location: Patient: At his home  Provider: In the office    I discussed the limitations of evaluation and management by telemedicine and the availability of in person appointments. The patient expressed understanding and agreed to proceed.  History of Present Illness: 07/01/2019 SS: [name] is a 33 year old male with history of schizoaffective disorder and frequent headache.  His headaches are under much better control by taking 100 mg of Topamax at bedtime.  He indicates he continues to have 2-3 headaches a week, that are relieved with Excedrin Migraine.  He has an issue that is more concerning to him, regarding a wound to his left calf.  He follows with Dr. Mylo Red, orthopedic doctor at [facility].  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  He indicates he was told his records have been sent to our office.  He currently is not working, he is on disability.  He reports he has had 2 falls as of recent due to his balance being off.  He presents today for follow-up via virtual visit.   02/06/2019 Dr. Jannifer Franklin: [name] is a 33 year old right-handed black male with a history of schizoaffective disorder and a history of frequent headaches.  The patient has had prior history of alcohol overuse, he is still drinking at least 2 beers a day.  The patient has had daily headaches in the past.  He indicates that he has not been able to tolerate the gabapentin he was placed on and stop the medication.  He remains on Topamax 25 mg at night.  He has started taking alprazolam 2 mg 3 times daily, he claims that his Onfi takes his Topamax and alprazolam on a regular basis he has fewer headaches.  He  now averages 2 or 3 headaches a week, he may occasionally take Tylenol or Advil for the headache with some benefit.  The has had some swelling in the calf muscle on the left for several months with some pain with walking.  He has seen a rheumatology doctor who ordered MRI of the leg which shows inflammatory changes in the muscle that could be consistent with polymyositis.  The patient however notes focal changes in the leg.   Observations/Objective: Is alert and oriented, answers questions appropriately, speech is clear and concise, facial symmetry noted, no arm drift, gait appears to be mildly limping on the left side  Assessment and Plan: 1.  Headaches -Under good control per patient, 2-3 per week -Continue Topamax 100 mg at bedtime, he does not wish to go up on the dose (will refill)  2. Left calf swelling and pain  -Muscle biopsy done at [facility] consistent with chronic myopathy, possible concern for muscular dystrophy -Will be set up for revisit in office with Dr. Jannifer Franklin in the next 6-8 weeks -He also has HIV disease  Follow Up Instructions: Needs to see Dr. Jannifer Franklin for first available office visit in the next 6-8 weeks    I discussed the assessment and treatment plan with the patient. The patient was provided an opportunity to ask questions and all were answered. The patient agreed with the plan and demonstrated an understanding of the instructions.   The patient was advised to  call back or seek an in-person evaluation if the symptoms worsen or if the condition fails to improve as anticipated.  I provided 15 minutes of non-face-to-face time during this encounter.   Evangeline Dakin, DNP  [facility] [address] [place], [place] [address] [phone]

## 2019-07-01 ENCOUNTER — Encounter: Payer: Self-pay | Admitting: Neurology

## 2019-07-01 ENCOUNTER — Telehealth: Payer: Self-pay | Admitting: Neurology

## 2019-07-01 ENCOUNTER — Telehealth (INDEPENDENT_AMBULATORY_CARE_PROVIDER_SITE_OTHER): Payer: Medicare HMO | Admitting: Neurology

## 2019-07-01 DIAGNOSIS — G43719 Chronic migraine without aura, intractable, without status migrainosus: Secondary | ICD-10-CM

## 2019-07-01 MED ORDER — TOPIRAMATE 100 MG PO TABS: 100.0000 mg | ORAL_TABLET | Freq: Every day | ORAL | 1 refills | Status: DC

## 2019-07-01 NOTE — Telephone Encounter (Signed)
Can you get this patient set up to see Dr. Jannifer Franklin in the next 6-8 weeks for follow-up regarding muscle biopsy done at [facility] concerning for muscular dystrophy. He sees Dr. Mylo Red at [facility].

## 2019-07-01 NOTE — Progress Notes (Signed)
I have read the note, and I agree with the clinical assessment and plan.  Yehya Brendle K Daya Dutt   

## 2019-07-02 NOTE — Telephone Encounter (Signed)
I left a vm for [name] asking him to call back so we could schedule a 6 to 8 week f/u with Dr. Jannifer Franklin.

## 2019-09-02 ENCOUNTER — Ambulatory Visit (INDEPENDENT_AMBULATORY_CARE_PROVIDER_SITE_OTHER): Payer: Medicare HMO | Admitting: Neurology

## 2019-09-02 ENCOUNTER — Other Ambulatory Visit: Payer: Self-pay

## 2019-09-02 ENCOUNTER — Encounter: Payer: Self-pay | Admitting: Neurology

## 2019-09-02 VITALS — BP 131/86 | HR 64 | Temp 97.5°F | Ht 75.0 in | Wt 303.0 lb

## 2019-09-02 DIAGNOSIS — G729 Myopathy, unspecified: Secondary | ICD-10-CM

## 2019-09-02 DIAGNOSIS — Z5181 Encounter for therapeutic drug level monitoring: Secondary | ICD-10-CM | POA: Diagnosis not present

## 2019-09-02 NOTE — Addendum Note (Signed)
Addended by: Kathrynn Ducking on: 09/02/2019 07:22 PM   Modules accepted: Level of Service

## 2019-09-02 NOTE — Progress Notes (Signed)
Reason for visit: Headache, possible myopathy or muscular dystrophy  [name] is an 32 y.o. male  History of present illness:  [name] is a 33 year old right-handed black male with a history of intractable migraine headache.  The patient is on 100 mg of the Topamax daily which has reduced his headaches to having only 3 or so a week, he takes Excedrin Migraine for the headache when he gets them and this seems to help.  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  The patient however has a chronic HIV infection and is on antiviral agents.  The patient has reported 2 falls recently, 1 fall 3 weeks ago and then 1 a week ago.  He reports some difficulty getting up from a chair.  He has some numbness in the feet on both sides.  He has some discomfort in the groin area and hips.  He has some difficulty with controlling the bowels and bladder at times.  He continues to drink but he claims that he only has about 7 beers a week.  He returns to the office today for an evaluation.  The patient does report frequent cramps in the upper thighs bilaterally.  Past Medical History:  Diagnosis Date  . Bipolar 1 disorder ([place])   . Depression   . Dizziness and giddiness 02/01/2016  . Herpes genitalia   . HIV disease ([place])   . Hypertension   . Migraine headache 02/01/2016  . PTSD (post-traumatic stress disorder)   . Schizoaffective disorder ([place])   . Seizures ([place])     Past Surgical History:  Procedure Laterality Date  . BACK SURGERY    . HAND SURGERY      Family History  Problem Relation Age of Onset  . Alcohol abuse Mother   . Schizophrenia Father   . Depression Father   . Alcohol abuse Father   . Alcohol abuse Paternal Uncle   . Alcohol abuse Paternal Uncle     Social history:  reports that he has been smoking cigarettes. He has been smoking about 1.00 pack per day. He has never used smokeless tobacco. He  reports current alcohol use of about 14.0 standard drinks of alcohol per week. He reports that he does not use drugs.    Allergies  Allergen Reactions  . Dapsone Other (See Comments)    Per centricity "G6PD deficient"  . Primaquine Phosphate Other (See Comments)    Per Centricity "G6PD deficient"    Medications:  Prior to Admission medications   Medication Sig Start Date End Date Taking? Authorizing Provider  albuterol (PROVENTIL HFA;VENTOLIN HFA) 108 (90 BASE) MCG/ACT inhaler Inhale 2 puffs into the lungs every 6 (six) hours as needed for wheezing or shortness of breath.   Yes [provider]  alprazolam Duanne Moron) 2 MG tablet Take 2 mg by mouth 3 (three) times daily.   Yes [provider]  bictegravir-emtricitabine-tenofovir AF (BIKTARVY) 50-200-25 MG TABS tablet Take 1 tablet by mouth daily.  02/24/17  Yes [provider]  cetirizine (ZYRTEC) 10 MG tablet Take 1 tablet (10 mg total) by mouth daily. 05/15/15  Yes Niel Hummer, NP  famotidine (PEPCID) 40 MG tablet Take 40 mg by mouth daily.   Yes [provider]  FLUoxetine (PROZAC) 40 MG capsule Take 40 mg by mouth daily.   Yes [provider]  hydrOXYzine (ATARAX/VISTARIL) 50 MG tablet Take 1 tablet (50 mg total) by mouth every  6 (six) hours as needed for anxiety. 07/25/18  Yes Pennelope Bracken, MD  linaclotide Rolan Lipa) 145 MCG CAPS capsule Take 145 mcg by mouth daily as needed (constipation).   Yes [provider]  lurasidone 120 MG TABS Take 1 tablet (120 mg total) by mouth daily. 05/15/15  Yes Niel Hummer, NP  thiamine (VITAMIN B-1) 100 MG tablet TAKE 1 TABLET ORALLY DAILY 01/31/19  Yes [provider]  topiramate (TOPAMAX) 100 MG tablet Take 1 tablet (100 mg total) by mouth at bedtime. 07/01/19  Yes Suzzanne Cloud, NP  traZODone (DESYREL) 300 MG tablet Take 1 tablet (300 mg total) by mouth at bedtime as needed for sleep. Patient taking differently: Take 300 mg by  mouth at bedtime.  05/15/15  Yes Niel Hummer, NP  valACYclovir (VALTREX) 1000 MG tablet Take 1 tablet (1,000 mg total) by mouth daily. 05/15/15  Yes Niel Hummer, NP    ROS:  Out of a complete 14 system review of symptoms, the patient complains only of the following symptoms, and all other reviewed systems are negative.  Weakness, numbness Walking difficulty  Blood pressure 131/86, pulse 64, temperature (!) 97.5 F (36.4 C), height 6' 3"  (1.905 m), weight (!) 303 lb (137.4 kg).  Physical Exam  General: The patient is alert and cooperative at the time of the examination.  Skin: No significant peripheral edema is noted.  The patient has unusual enlargement of the gastrocnemius muscles on both sides.   Neurologic Exam  Mental status: The patient is alert and oriented x 3 at the time of the examination. The patient has apparent normal recent and remote memory, with an apparently normal attention span and concentration ability.   Cranial nerves: Facial symmetry is present. Speech is normal, no aphasia or dysarthria is noted. Extraocular movements are full. Visual fields are full.  Good strength with neck flexion and extension is seen bilaterally.  Motor: The patient has good strength in all 4 extremities.  Sensory examination: Soft touch sensation is symmetric on the face, arms, and legs.  Coordination: The patient has good finger-nose-finger and heel-to-shin bilaterally.  Gait and station: The patient has a slightly wide-based gait, he can walk independently.  He is able to rise from a seated position with arms crossed.. Tandem gait is slightly unsteady. Romberg is negative. No drift is seen.  Reflexes: Deep tendon reflexes are symmetric, but are depressed.   Assessment/Plan:  1.  Migraine headache, intractable  2.  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  History of HIV infection on antiviral agents  The patient has well-controlled HIV on medications.  The patient has had minimal  elevations in muscle enzyme levels in the past.  He has relatively good strength on motor testing today.  There have been abnormalities on MRI of the legs suggesting of muscle disorder, muscle biopsy of the left gastrocnemius muscle suggest a chronic myopathy or muscular dystrophy.  The patient does have an unusual enlargement of the gastrocnemius muscles bilaterally, a dystrophin test may need to be done in the future.  He will be set up for blood work today, he will have nerve conductions on both legs and 1 arm, EMG on 1 arm and 1 leg.  He does not wish to go up on the dosing of the Topamax.  [name] MD 09/02/2019 12:50 PM  [facility] [address] [place], [place] [address]  Phone [phone] Fax [phone]

## 2019-09-04 ENCOUNTER — Telehealth: Payer: Self-pay | Admitting: Neurology

## 2019-09-04 LAB — CBC WITH DIFFERENTIAL/PLATELET
Basophils Absolute: 0.1 10*3/uL (ref 0.0–0.2)
Basos: 1 %
EOS (ABSOLUTE): 0.1 10*3/uL (ref 0.0–0.4)
Eos: 1 %
Hematocrit: 35.2 % — ABNORMAL LOW (ref 37.5–51.0)
Hemoglobin: 12.1 g/dL — ABNORMAL LOW (ref 13.0–17.7)
Immature Grans (Abs): 0.1 10*3/uL (ref 0.0–0.1)
Immature Granulocytes: 2 %
Lymphocytes Absolute: 1.8 10*3/uL (ref 0.7–3.1)
Lymphs: 24 %
MCH: 33.8 pg — ABNORMAL HIGH (ref 26.6–33.0)
MCHC: 34.4 g/dL (ref 31.5–35.7)
MCV: 98 fL — ABNORMAL HIGH (ref 79–97)
Monocytes Absolute: 0.8 10*3/uL (ref 0.1–0.9)
Monocytes: 12 %
Neutrophils Absolute: 4.5 10*3/uL (ref 1.4–7.0)
Neutrophils: 60 %
Platelets: 128 10*3/uL — ABNORMAL LOW (ref 150–450)
RBC: 3.58 x10E6/uL — ABNORMAL LOW (ref 4.14–5.80)
RDW: 13.9 % (ref 11.6–15.4)
WBC: 7.3 10*3/uL (ref 3.4–10.8)

## 2019-09-04 LAB — SEDIMENTATION RATE: Sed Rate: 113 mm/hr — ABNORMAL HIGH (ref 0–15)

## 2019-09-04 LAB — COMPREHENSIVE METABOLIC PANEL
ALT: 84 IU/L — ABNORMAL HIGH (ref 0–44)
AST: 106 IU/L — ABNORMAL HIGH (ref 0–40)
Albumin/Globulin Ratio: 1 — ABNORMAL LOW (ref 1.2–2.2)
Albumin: 4.3 g/dL (ref 4.0–5.0)
Alkaline Phosphatase: 124 IU/L — ABNORMAL HIGH (ref 39–117)
BUN/Creatinine Ratio: 5 — ABNORMAL LOW (ref 9–20)
BUN: 5 mg/dL — ABNORMAL LOW (ref 6–20)
Bilirubin Total: 0.4 mg/dL (ref 0.0–1.2)
CO2: 19 mmol/L — ABNORMAL LOW (ref 20–29)
Calcium: 9.9 mg/dL (ref 8.7–10.2)
Chloride: 102 mmol/L (ref 96–106)
Creatinine, Ser: 1.06 mg/dL (ref 0.76–1.27)
GFR calc Af Amer: 105 mL/min/{1.73_m2} (ref >59)
GFR calc non Af Amer: 90 mL/min/{1.73_m2} (ref >59)
Globulin, Total: 4.1 g/dL (ref 1.5–4.5)
Glucose: 99 mg/dL (ref 65–99)
Potassium: 4 mmol/L (ref 3.5–5.2)
Sodium: 144 mmol/L (ref 134–144)
Total Protein: 8.4 g/dL (ref 6.0–8.5)

## 2019-09-04 LAB — CK: Total CK: 337 U/L (ref 49–439)

## 2019-09-04 LAB — B. BURGDORFI ANTIBODIES: Lyme IgG/IgM Ab: <0.91 {ISR} (ref 0.00–0.90)

## 2019-09-04 LAB — ANGIOTENSIN CONVERTING ENZYME: Angio Convert Enzyme: 73 U/L (ref 14–82)

## 2019-09-04 LAB — ANA W/REFLEX: Anti Nuclear Antibody (ANA): NEGATIVE

## 2019-09-04 NOTE — Telephone Encounter (Signed)
I called the patient.  The blood work shows an elevated sedimentation rate with a chronic anemia and a low platelet level.  The patient appears to have elevation in liver enzymes, CO2 level is slightly low.  Muscle enzyme levels are normal, sedimentation rate is extremely high, but the patient reported a recent abscess involving the left gastrocnemius muscle where they did the muscle biopsy.  I will send the blood work reports to the primary care physician.

## 2019-10-01 ENCOUNTER — Ambulatory Visit (INDEPENDENT_AMBULATORY_CARE_PROVIDER_SITE_OTHER): Payer: Medicare HMO | Admitting: Neurology

## 2019-10-01 ENCOUNTER — Encounter: Payer: Self-pay | Admitting: Neurology

## 2019-10-01 DIAGNOSIS — G603 Idiopathic progressive neuropathy: Secondary | ICD-10-CM

## 2019-10-01 DIAGNOSIS — G729 Myopathy, unspecified: Secondary | ICD-10-CM

## 2019-10-01 DIAGNOSIS — G629 Polyneuropathy, unspecified: Secondary | ICD-10-CM | POA: Insufficient documentation

## 2019-10-01 HISTORY — DX: Polyneuropathy, unspecified: G62.9

## 2019-10-01 NOTE — Progress Notes (Addendum)
The patient returns for EMG and nerve conduction study.  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  EMG of the right arm and right leg do not show clear evidence of an underlying myopathic disorder.  The patient will be sent for further blood work today to evaluate for neuropathy.  This may explain some of his gait instability issues.    [place]    Nerve / Sites Muscle Latency Ref. Amplitude Ref. Rel Amp Segments Distance Velocity Ref. Area    ms ms mV mV %  cm m/s m/s mVms  R Ulnar - ADM     Wrist ADM 3.6 ?3.3 5.7 ?6.0 100 Wrist - ADM 7   20.9     B.Elbow ADM 10.4  4.0  69.8 B.Elbow - Wrist 26 38 ?49 15.6     A.Elbow ADM 13.7  4.0  101 A.Elbow - B.Elbow 10 30 ?49 16.4         A.Elbow - Wrist      R Peroneal - EDB     Ankle EDB NR ?6.5 NR ?2.0 NR Ankle - EDB 9   NR     Fib head EDB NR  NR  NR Fib head - Ankle 37 NR ?44 NR  L Peroneal - EDB     Ankle EDB NR ?6.5 NR ?2.0 NR Ankle - EDB 9   NR     Fib head EDB NR  NR  NR Fib head - Ankle 37 NR ?44 NR  R Tibial - AH     Ankle AH NR ?5.8 NR ?4.0 NR Ankle - AH 9   NR  L Tibial - AH     Ankle AH NR ?5.8 NR ?4.0 NR Ankle - AH 9   NR               SNC    Nerve / Sites Rec. Site Peak Lat Ref.  Amp Ref. Segments Distance    ms ms V V  cm  R Radial - Anatomical snuff box (Forearm)     Forearm Wrist 2.6 ?2.9 18 ?15 Forearm - Wrist 10  R Sural - Ankle (Calf)     Calf Ankle NR ?4.4 NR ?6 Calf - Ankle 14  L Sural - Ankle (Calf)     Calf Ankle NR ?4.4 NR ?6 Calf - Ankle 14  R Superficial peroneal - Ankle     Lat leg Ankle NR ?4.4 NR ?6 Lat leg - Ankle 14  L Superficial peroneal - Ankle     Lat leg Ankle NR ?4.4 NR ?6 Lat leg - Ankle 14  R Ulnar - Orthodromic, (Dig V, Mid palm)     Dig V Wrist NR ?3.1 NR ?5 Dig V - Wrist 30                 F  Wave    Nerve F Lat Ref.   ms ms  R Ulnar - ADM 43.2 ?32.0

## 2019-10-01 NOTE — Progress Notes (Signed)
Please refer to EMG and nerve conduction procedure note.  

## 2019-10-01 NOTE — Procedures (Signed)
     HISTORY:  [name] is a 33 year old gentleman with a history of chronic migraine headache and a history of an HIV infection.  The patient reports some numbness in the feet, he has had a muscle biopsy that suggest the possibility of a muscular dystrophy.  Muscle enzyme levels have been normal.  He is being evaluated for possible myopathic disorder.  NERVE CONDUCTION STUDIES:  Nerve conduction studies were performed on the right upper extremity.  The distal motor latency for the right ulnar nerve was prolonged with a slightly low motor amplitude.  Slowing was seen above and below the elbow for the right ulnar nerve.  The radial sensory latency on the right is normal, the right ulnar sensory latency is unobtainable.  The F-wave latency for the right ulnar nerve is prolonged.  Nerve conduction studies were performed on the lower extremities bilaterally.  No response was seen for the peroneal or posterior tibial nerves bilaterally.  The sensory latencies for the sural and peroneal nerves were unobtainable bilaterally.  EMG STUDIES:  EMG study was performed on the right upper extremity:  The first dorsal interosseous muscle reveals 2 to 4 K units with slightly reduced recruitment. No fibrillations or positive waves were noted. The abductor pollicis brevis muscle reveals 2 to 4 K units with full recruitment. No fibrillations or positive waves were noted. The extensor indicis proprius muscle reveals 1 to 3 K units with full recruitment. No fibrillations or positive waves were noted. The biceps muscle reveals 1 to 2 K units with full recruitment. No fibrillations or positive waves were noted. The triceps muscle reveals 2 to 4 K units with full recruitment. No fibrillations or positive waves were noted. The anterior deltoid muscle reveals 2 to 3 K units with full recruitment. No fibrillations or positive waves were noted. The cervical paraspinal muscles were tested at 2 levels. No  abnormalities of insertional activity were seen at either level tested. There was good relaxation.  EMG study was performed on the right lower extremity:  The tibialis anterior muscle reveals 2 to 3K motor units with slightly reduced recruitment. 1+ fibrillations and positive waves were seen. The peroneus tertius muscle reveals 1 to 4K motor units with slightly reduced recruitment. 1+ fibrillations and positive waves were seen. The medial gastrocnemius muscle reveals 1 to 2K motor units with decreased recruitment. 2+ fibrillations and positive waves were seen. The vastus lateralis muscle reveals 1 to 3K motor units with full recruitment. No fibrillations or positive waves were seen. The iliopsoas muscle reveals 2 to 3K motor units with full recruitment. No fibrillations or positive waves were seen. The biceps femoris muscle (long head) reveals 2 to 4K motor units with full recruitment. No fibrillations or positive waves were seen. The lumbosacral paraspinal muscles were tested at 3 levels, and revealed no abnormalities of insertional activity at all 3 levels tested. There was good relaxation.   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  EMG evaluation of the right upper extremity and right lower extremity show primarily neuropathic distal chronic and acute denervation consistent with a diagnosis of peripheral neuropathy.  An underlying myopathic disorder cannot be confirmed on this evaluation.  Drew Alexanders MD 10/01/2019 5:04 PM  [facility] [address] [place], [place] [address]  Phone [phone] Fax [phone]

## 2019-10-03 LAB — MULTIPLE MYELOMA PANEL, SERUM
Albumin SerPl Elph-Mcnc: 3.6 g/dL (ref 2.9–4.4)
Albumin/Glob SerPl: 0.9 (ref 0.7–1.7)
Alpha 1: 0.3 g/dL (ref 0.0–0.4)
Alpha2 Glob SerPl Elph-Mcnc: 0.7 g/dL (ref 0.4–1.0)
B-Globulin SerPl Elph-Mcnc: 1 g/dL (ref 0.7–1.3)
Gamma Glob SerPl Elph-Mcnc: 2.2 g/dL — ABNORMAL HIGH (ref 0.4–1.8)
Globulin, Total: 4.3 g/dL — ABNORMAL HIGH (ref 2.2–3.9)
IgA/Immunoglobulin A, Serum: 390 mg/dL — ABNORMAL HIGH (ref 90–386)
IgG (Immunoglobin G), Serum: 2386 mg/dL — ABNORMAL HIGH (ref 603–1613)
IgM (Immunoglobulin M), Srm: 60 mg/dL (ref 20–172)
Total Protein: 7.9 g/dL (ref 6.0–8.5)

## 2019-10-03 LAB — SEDIMENTATION RATE: Sed Rate: 65 mm/hr — ABNORMAL HIGH (ref 0–15)

## 2019-10-03 LAB — RHEUMATOID FACTOR: Rheumatoid fact SerPl-aCnc: 12.3 IU/mL (ref 0.0–13.9)

## 2019-10-03 LAB — C-REACTIVE PROTEIN: CRP: 24 mg/L — ABNORMAL HIGH (ref 0–10)

## 2019-10-03 LAB — VITAMIN B12: Vitamin B-12: 731 pg/mL (ref 232–1245)

## 2019-10-27 ENCOUNTER — Emergency Department (HOSPITAL_COMMUNITY): Payer: Medicare HMO

## 2019-10-27 ENCOUNTER — Inpatient Hospital Stay (HOSPITAL_COMMUNITY): Payer: Medicare HMO

## 2019-10-27 ENCOUNTER — Encounter (HOSPITAL_COMMUNITY): Payer: Self-pay | Admitting: Emergency Medicine

## 2019-10-27 ENCOUNTER — Inpatient Hospital Stay (HOSPITAL_COMMUNITY)
Admission: EM | Admit: 2019-10-27 | Discharge: 2019-11-02 | DRG: 974 | Disposition: A | Discharge status: Home Health | Payer: Medicare HMO | Attending: Family Medicine | Admitting: Family Medicine

## 2019-10-27 DIAGNOSIS — K219 Gastro-esophageal reflux disease without esophagitis: Secondary | ICD-10-CM | POA: Diagnosis present

## 2019-10-27 DIAGNOSIS — Z7289 Other problems related to lifestyle: Secondary | ICD-10-CM

## 2019-10-27 DIAGNOSIS — J9601 Acute respiratory failure with hypoxia: Secondary | ICD-10-CM | POA: Diagnosis present

## 2019-10-27 DIAGNOSIS — R0902 Hypoxemia: Secondary | ICD-10-CM

## 2019-10-27 DIAGNOSIS — E871 Hypo-osmolality and hyponatremia: Secondary | ICD-10-CM | POA: Diagnosis present

## 2019-10-27 DIAGNOSIS — G40909 Epilepsy, unspecified, not intractable, without status epilepticus: Secondary | ICD-10-CM | POA: Diagnosis present

## 2019-10-27 DIAGNOSIS — R4182 Altered mental status, unspecified: Secondary | ICD-10-CM | POA: Diagnosis not present

## 2019-10-27 DIAGNOSIS — F4310 Post-traumatic stress disorder, unspecified: Secondary | ICD-10-CM | POA: Diagnosis present

## 2019-10-27 DIAGNOSIS — J1282 Pneumonia due to coronavirus disease 2019: Secondary | ICD-10-CM | POA: Diagnosis present

## 2019-10-27 DIAGNOSIS — R4781 Slurred speech: Secondary | ICD-10-CM | POA: Diagnosis present

## 2019-10-27 DIAGNOSIS — R1310 Dysphagia, unspecified: Secondary | ICD-10-CM | POA: Diagnosis present

## 2019-10-27 DIAGNOSIS — G629 Polyneuropathy, unspecified: Secondary | ICD-10-CM | POA: Diagnosis present

## 2019-10-27 DIAGNOSIS — F251 Schizoaffective disorder, depressive type: Secondary | ICD-10-CM | POA: Diagnosis not present

## 2019-10-27 DIAGNOSIS — U071 COVID-19: Principal | ICD-10-CM

## 2019-10-27 DIAGNOSIS — E876 Hypokalemia: Secondary | ICD-10-CM | POA: Diagnosis not present

## 2019-10-27 DIAGNOSIS — G8194 Hemiplegia, unspecified affecting left nondominant side: Secondary | ICD-10-CM | POA: Diagnosis not present

## 2019-10-27 DIAGNOSIS — E669 Obesity, unspecified: Secondary | ICD-10-CM | POA: Diagnosis present

## 2019-10-27 DIAGNOSIS — J9602 Acute respiratory failure with hypercapnia: Secondary | ICD-10-CM | POA: Diagnosis present

## 2019-10-27 DIAGNOSIS — G4700 Insomnia, unspecified: Secondary | ICD-10-CM | POA: Diagnosis present

## 2019-10-27 DIAGNOSIS — R2990 Unspecified symptoms and signs involving the nervous system: Secondary | ICD-10-CM

## 2019-10-27 DIAGNOSIS — K7581 Nonalcoholic steatohepatitis (NASH): Secondary | ICD-10-CM | POA: Diagnosis present

## 2019-10-27 DIAGNOSIS — I10 Essential (primary) hypertension: Secondary | ICD-10-CM | POA: Diagnosis present

## 2019-10-27 DIAGNOSIS — K2100 Gastro-esophageal reflux disease with esophagitis, without bleeding: Secondary | ICD-10-CM

## 2019-10-27 DIAGNOSIS — Z20822 Contact with and (suspected) exposure to covid-19: Secondary | ICD-10-CM

## 2019-10-27 DIAGNOSIS — Z811 Family history of alcohol abuse and dependence: Secondary | ICD-10-CM

## 2019-10-27 DIAGNOSIS — R0602 Shortness of breath: Secondary | ICD-10-CM | POA: Diagnosis present

## 2019-10-27 DIAGNOSIS — B20 Human immunodeficiency virus [HIV] disease: Secondary | ICD-10-CM | POA: Diagnosis present

## 2019-10-27 DIAGNOSIS — Z79899 Other long term (current) drug therapy: Secondary | ICD-10-CM

## 2019-10-27 DIAGNOSIS — Z818 Family history of other mental and behavioral disorders: Secondary | ICD-10-CM

## 2019-10-27 DIAGNOSIS — G92 Toxic encephalopathy: Secondary | ICD-10-CM | POA: Diagnosis present

## 2019-10-27 DIAGNOSIS — J9600 Acute respiratory failure, unspecified whether with hypoxia or hypercapnia: Secondary | ICD-10-CM | POA: Diagnosis not present

## 2019-10-27 DIAGNOSIS — Z888 Allergy status to other drugs, medicaments and biological substances status: Secondary | ICD-10-CM

## 2019-10-27 DIAGNOSIS — Z79891 Long term (current) use of opiate analgesic: Secondary | ICD-10-CM

## 2019-10-27 DIAGNOSIS — F17210 Nicotine dependence, cigarettes, uncomplicated: Secondary | ICD-10-CM | POA: Diagnosis present

## 2019-10-27 DIAGNOSIS — F319 Bipolar disorder, unspecified: Secondary | ICD-10-CM | POA: Diagnosis present

## 2019-10-27 DIAGNOSIS — G7100 Muscular dystrophy, unspecified: Secondary | ICD-10-CM | POA: Diagnosis present

## 2019-10-27 DIAGNOSIS — Z6837 Body mass index (BMI) 37.0-37.9, adult: Secondary | ICD-10-CM

## 2019-10-27 DIAGNOSIS — Z716 Tobacco abuse counseling: Secondary | ICD-10-CM

## 2019-10-27 DIAGNOSIS — R7881 Bacteremia: Secondary | ICD-10-CM | POA: Diagnosis not present

## 2019-10-27 LAB — COMPREHENSIVE METABOLIC PANEL
ALT: 58 U/L — ABNORMAL HIGH (ref 0–44)
ALT: 67 U/L — ABNORMAL HIGH (ref 0–44)
AST: 206 U/L — ABNORMAL HIGH (ref 15–41)
AST: 223 U/L — ABNORMAL HIGH (ref 15–41)
Albumin: 2.5 g/dL — ABNORMAL LOW (ref 3.5–5.0)
Albumin: 2.7 g/dL — ABNORMAL LOW (ref 3.5–5.0)
Alkaline Phosphatase: 69 U/L (ref 38–126)
Alkaline Phosphatase: 81 U/L (ref 38–126)
Anion gap: 15 (ref 5–15)
Anion gap: 15 (ref 5–15)
BUN: 14 mg/dL (ref 6–20)
BUN: 16 mg/dL (ref 6–20)
CO2: 19 mmol/L — ABNORMAL LOW (ref 22–32)
CO2: 23 mmol/L (ref 22–32)
Calcium: 8.6 mg/dL — ABNORMAL LOW (ref 8.9–10.3)
Calcium: 9.3 mg/dL (ref 8.9–10.3)
Chloride: 97 mmol/L — ABNORMAL LOW (ref 98–111)
Chloride: 96 mmol/L — ABNORMAL LOW (ref 98–111)
Creatinine, Ser: 1.12 mg/dL (ref 0.61–1.24)
Creatinine, Ser: 1.15 mg/dL (ref 0.61–1.24)
GFR calc Af Amer: >60 mL/min (ref >60)
GFR calc Af Amer: >60 mL/min (ref >60)
GFR calc non Af Amer: >60 mL/min (ref >60)
GFR calc non Af Amer: >60 mL/min (ref >60)
Glucose, Bld: 75 mg/dL (ref 70–99)
Glucose, Bld: 122 mg/dL — ABNORMAL HIGH (ref 70–99)
Potassium: 2.9 mmol/L — ABNORMAL LOW (ref 3.5–5.1)
Potassium: 4 mmol/L (ref 3.5–5.1)
Sodium: 131 mmol/L — ABNORMAL LOW (ref 135–145)
Sodium: 134 mmol/L — ABNORMAL LOW (ref 135–145)
Total Bilirubin: 1.5 mg/dL — ABNORMAL HIGH (ref 0.3–1.2)
Total Bilirubin: 1.4 mg/dL — ABNORMAL HIGH (ref 0.3–1.2)
Total Protein: 6.8 g/dL (ref 6.5–8.1)
Total Protein: 7.8 g/dL (ref 6.5–8.1)

## 2019-10-27 LAB — RESPIRATORY PANEL BY RT PCR (FLU A&B, COVID)
Influenza A by PCR: NEGATIVE
Influenza B by PCR: NEGATIVE
SARS Coronavirus 2 by RT PCR: POSITIVE — AB

## 2019-10-27 LAB — C-REACTIVE PROTEIN: CRP: 13 mg/dL — ABNORMAL HIGH (ref <1.0)

## 2019-10-27 LAB — LACTATE DEHYDROGENASE: LDH: 458 U/L — ABNORMAL HIGH (ref 98–192)

## 2019-10-27 LAB — TRIGLYCERIDES: Triglycerides: 94 mg/dL (ref <150)

## 2019-10-27 LAB — CBC WITH DIFFERENTIAL/PLATELET
Abs Immature Granulocytes: 0.35 10*3/uL — ABNORMAL HIGH (ref 0.00–0.07)
Basophils Absolute: 0.1 10*3/uL (ref 0.0–0.1)
Basophils Relative: 1 %
Eosinophils Absolute: 0 10*3/uL (ref 0.0–0.5)
Eosinophils Relative: 0 %
HCT: 33.9 % — ABNORMAL LOW (ref 39.0–52.0)
Hemoglobin: 10.9 g/dL — ABNORMAL LOW (ref 13.0–17.0)
Immature Granulocytes: 4 %
Lymphocytes Relative: 10 %
Lymphs Abs: 0.9 10*3/uL (ref 0.7–4.0)
MCH: 33.9 pg (ref 26.0–34.0)
MCHC: 32.2 g/dL (ref 30.0–36.0)
MCV: 105.3 fL — ABNORMAL HIGH (ref 80.0–100.0)
Monocytes Absolute: 1 10*3/uL (ref 0.1–1.0)
Monocytes Relative: 12 %
Neutro Abs: 6.5 10*3/uL (ref 1.7–7.7)
Neutrophils Relative %: 73 %
Platelets: 127 10*3/uL — ABNORMAL LOW (ref 150–400)
RBC: 3.22 MIL/uL — ABNORMAL LOW (ref 4.22–5.81)
RDW: 15 % (ref 11.5–15.5)
WBC: 8.8 10*3/uL (ref 4.0–10.5)
nRBC: 0 % (ref 0.0–0.2)

## 2019-10-27 LAB — BLOOD GAS, VENOUS
Acid-base deficit: 1.7 mmol/L (ref 0.0–2.0)
Bicarbonate: 23.2 mmol/L (ref 20.0–28.0)
FIO2: 100
O2 Saturation: 97.8 %
Patient temperature: 98.6
pCO2, Ven: 42.5 mmHg — ABNORMAL LOW (ref 44.0–60.0)
pH, Ven: 7.356 (ref 7.250–7.430)
pO2, Ven: 125 mmHg — ABNORMAL HIGH (ref 32.0–45.0)

## 2019-10-27 LAB — D-DIMER, QUANTITATIVE: D-Dimer, Quant: 1.45 ug/mL-FEU — ABNORMAL HIGH (ref 0.00–0.50)

## 2019-10-27 LAB — GLUCOSE, CAPILLARY
Glucose-Capillary: 96 mg/dL (ref 70–99)
Glucose-Capillary: 120 mg/dL — ABNORMAL HIGH (ref 70–99)

## 2019-10-27 LAB — MAGNESIUM: Magnesium: 2.4 mg/dL (ref 1.7–2.4)

## 2019-10-27 LAB — PROCALCITONIN: Procalcitonin: 0.72 ng/mL

## 2019-10-27 LAB — FIBRINOGEN: Fibrinogen: 521 mg/dL — ABNORMAL HIGH (ref 210–475)

## 2019-10-27 LAB — ETHANOL
Alcohol, Ethyl (B): <10 mg/dL (ref <10)
Alcohol, Ethyl (B): <10 mg/dL (ref <10)

## 2019-10-27 LAB — LACTIC ACID, PLASMA
Lactic Acid, Venous: 3.2 mmol/L (ref 0.5–1.9)
Lactic Acid, Venous: 2.3 mmol/L (ref 0.5–1.9)

## 2019-10-27 LAB — POC SARS CORONAVIRUS 2 AG -  ED: SARS Coronavirus 2 Ag: NEGATIVE

## 2019-10-27 LAB — FERRITIN: Ferritin: 797 ng/mL — ABNORMAL HIGH (ref 24–336)

## 2019-10-27 MED ORDER — LORAZEPAM 2 MG/ML IJ SOLN: 1.0000 mg | INTRAMUSCULAR | Status: DC | PRN

## 2019-10-27 MED ORDER — FLUOXETINE HCL 20 MG PO CAPS
40.0000 mg | ORAL_CAPSULE | Freq: Every day | ORAL | Status: DC
  Administered 2019-10-28 – 2019-11-02 (×6): 40 mg via ORAL
  Filled 2019-10-27 (×6): qty 2

## 2019-10-27 MED ORDER — LORATADINE 10 MG PO TABS
10.0000 mg | ORAL_TABLET | Freq: Every day | ORAL | Status: DC
  Administered 2019-10-28 – 2019-11-02 (×6): 10 mg via ORAL
  Filled 2019-10-27 (×6): qty 1

## 2019-10-27 MED ORDER — ALPRAZOLAM 0.5 MG PO TABS: 2.0000 mg | ORAL_TABLET | Freq: Three times a day (TID) | ORAL | Status: DC

## 2019-10-27 MED ORDER — POTASSIUM CHLORIDE CRYS ER 20 MEQ PO TBCR
20.0000 meq | EXTENDED_RELEASE_TABLET | Freq: Once | ORAL | Status: AC
  Administered 2019-10-27: 20 meq via ORAL
  Filled 2019-10-27: qty 1

## 2019-10-27 MED ORDER — ALBUTEROL SULFATE HFA 108 (90 BASE) MCG/ACT IN AERS: 2.0000 | INHALATION_SPRAY | RESPIRATORY_TRACT | Status: DC

## 2019-10-27 MED ORDER — HYDROCODONE-ACETAMINOPHEN 5-325 MG PO TABS
1.0000 | ORAL_TABLET | ORAL | Status: DC | PRN
  Administered 2019-10-29: 1 via ORAL
  Administered 2019-10-30: 2 via ORAL
  Administered 2019-10-30: 1 via ORAL
  Administered 2019-10-31: 2 via ORAL
  Administered 2019-11-01: 1 via ORAL
  Filled 2019-10-27 (×3): qty 1
  Filled 2019-10-27 (×2): qty 2

## 2019-10-27 MED ORDER — NALOXONE HCL 0.4 MG/ML IJ SOLN
INTRAMUSCULAR | Status: AC
  Administered 2019-10-27: 0.4 mg via INTRAVENOUS
  Filled 2019-10-27: qty 1

## 2019-10-27 MED ORDER — ONDANSETRON HCL 4 MG PO TABS
4.0000 mg | ORAL_TABLET | Freq: Four times a day (QID) | ORAL | Status: DC | PRN
  Administered 2019-10-29 – 2019-10-31 (×2): 4 mg via ORAL
  Filled 2019-10-27 (×2): qty 1

## 2019-10-27 MED ORDER — TOCILIZUMAB 400 MG/20ML IV SOLN
800.0000 mg | Freq: Once | INTRAVENOUS | Status: AC
  Administered 2019-10-28: 800 mg via INTRAVENOUS
  Filled 2019-10-27: qty 40

## 2019-10-27 MED ORDER — LORAZEPAM 0.5 MG PO TABS: 1.0000 mg | ORAL_TABLET | ORAL | Status: DC | PRN

## 2019-10-27 MED ORDER — ONDANSETRON HCL 4 MG/2ML IJ SOLN
4.0000 mg | Freq: Four times a day (QID) | INTRAMUSCULAR | Status: DC | PRN
  Administered 2019-10-28: 4 mg via INTRAVENOUS
  Filled 2019-10-27: qty 2

## 2019-10-27 MED ORDER — SODIUM CHLORIDE 0.9 % IV SOLN
500.0000 mg | INTRAVENOUS | Status: DC
  Administered 2019-10-27 – 2019-11-01 (×6): 500 mg via INTRAVENOUS
  Filled 2019-10-27 (×6): qty 500

## 2019-10-27 MED ORDER — POTASSIUM CHLORIDE CRYS ER 20 MEQ PO TBCR: 40.0000 meq | EXTENDED_RELEASE_TABLET | Freq: Once | ORAL | Status: DC

## 2019-10-27 MED ORDER — FAMOTIDINE 20 MG PO TABS
40.0000 mg | ORAL_TABLET | Freq: Every day | ORAL | Status: DC
  Administered 2019-10-28 – 2019-11-02 (×6): 40 mg via ORAL
  Filled 2019-10-27 (×7): qty 2

## 2019-10-27 MED ORDER — DEXAMETHASONE SODIUM PHOSPHATE 10 MG/ML IJ SOLN
10.0000 mg | Freq: Once | INTRAMUSCULAR | Status: AC
  Administered 2019-10-27: 10 mg via INTRAVENOUS
  Filled 2019-10-27: qty 1

## 2019-10-27 MED ORDER — NALOXONE HCL 0.4 MG/ML IJ SOLN: 0.4000 mg | INTRAMUSCULAR | Status: DC | PRN

## 2019-10-27 MED ORDER — TRAZODONE HCL 50 MG PO TABS: 300.0000 mg | ORAL_TABLET | Freq: Every evening | ORAL | Status: DC | PRN

## 2019-10-27 MED ORDER — LEVETIRACETAM IN NACL 500 MG/100ML IV SOLN
500.0000 mg | Freq: Two times a day (BID) | INTRAVENOUS | Status: DC
  Administered 2019-10-27 – 2019-10-28 (×2): 500 mg via INTRAVENOUS
  Filled 2019-10-27 (×2): qty 100

## 2019-10-27 MED ORDER — IOHEXOL 350 MG/ML SOLN: 100.0000 mL | Freq: Once | INTRAVENOUS | Status: DC | PRN

## 2019-10-27 MED ORDER — ACETAMINOPHEN 325 MG PO TABS: 650.0000 mg | ORAL_TABLET | Freq: Once | ORAL | Status: DC

## 2019-10-27 MED ORDER — SODIUM CHLORIDE 0.9 % IV SOLN
100.0000 mg | Freq: Every day | INTRAVENOUS | Status: AC
  Administered 2019-10-28 – 2019-10-31 (×4): 100 mg via INTRAVENOUS
  Filled 2019-10-27 (×4): qty 20

## 2019-10-27 MED ORDER — ACETAMINOPHEN 650 MG RE SUPP: 650.0000 mg | Freq: Four times a day (QID) | RECTAL | Status: DC | PRN

## 2019-10-27 MED ORDER — SODIUM CHLORIDE 0.9 % IV BOLUS
500.0000 mL | Freq: Once | INTRAVENOUS | Status: AC
  Administered 2019-10-27: 500 mL via INTRAVENOUS

## 2019-10-27 MED ORDER — FLUMAZENIL 0.5 MG/5ML IV SOLN
0.2000 mg | Freq: Once | INTRAVENOUS | Status: AC
  Administered 2019-10-27: 0.2 mg via INTRAVENOUS
  Filled 2019-10-27: qty 5

## 2019-10-27 MED ORDER — POTASSIUM CHLORIDE 10 MEQ/100ML IV SOLN
10.0000 meq | INTRAVENOUS | Status: DC
  Administered 2019-10-27: 10 meq via INTRAVENOUS
  Filled 2019-10-27: qty 100

## 2019-10-27 MED ORDER — ALBUTEROL SULFATE HFA 108 (90 BASE) MCG/ACT IN AERS
2.0000 | INHALATION_SPRAY | RESPIRATORY_TRACT | Status: DC
  Administered 2019-10-27 – 2019-11-02 (×30): 2 via RESPIRATORY_TRACT
  Filled 2019-10-27 (×2): qty 6.7

## 2019-10-27 MED ORDER — IPRATROPIUM BROMIDE 0.02 % IN SOLN: 0.5000 mg | Freq: Four times a day (QID) | RESPIRATORY_TRACT | Status: DC

## 2019-10-27 MED ORDER — BICTEGRAVIR-EMTRICITAB-TENOFOV 50-200-25 MG PO TABS
1.0000 | ORAL_TABLET | Freq: Every day | ORAL | Status: DC
  Administered 2019-10-28 – 2019-11-02 (×6): 1 via ORAL
  Filled 2019-10-27 (×7): qty 1

## 2019-10-27 MED ORDER — SODIUM CHLORIDE 0.9% IV SOLUTION: Freq: Once | INTRAVENOUS | Status: AC

## 2019-10-27 MED ORDER — METHYLPREDNISOLONE SODIUM SUCC 125 MG IJ SOLR
60.0000 mg | Freq: Two times a day (BID) | INTRAMUSCULAR | Status: DC
  Administered 2019-10-27 – 2019-11-01 (×12): 60 mg via INTRAVENOUS
  Filled 2019-10-27 (×12): qty 2

## 2019-10-27 MED ORDER — SODIUM CHLORIDE 0.9 % IV SOLN
200.0000 mg | Freq: Once | INTRAVENOUS | Status: AC
  Administered 2019-10-28: 200 mg via INTRAVENOUS
  Filled 2019-10-27: qty 200

## 2019-10-27 MED ORDER — TOPIRAMATE 100 MG PO TABS
100.0000 mg | ORAL_TABLET | Freq: Every day | ORAL | Status: DC
  Filled 2019-10-27: qty 1

## 2019-10-27 MED ORDER — TOCILIZUMAB 400 MG/20ML IV SOLN: 800.0000 mg | Freq: Once | INTRAVENOUS | Status: DC

## 2019-10-27 MED ORDER — LURASIDONE HCL 40 MG PO TABS
120.0000 mg | ORAL_TABLET | Freq: Every day | ORAL | Status: DC
  Administered 2019-10-28 – 2019-11-02 (×6): 120 mg via ORAL
  Filled 2019-10-27 (×7): qty 3

## 2019-10-27 MED ORDER — OXYCODONE HCL 5 MG PO TABS
5.0000 mg | ORAL_TABLET | Freq: Four times a day (QID) | ORAL | Status: DC | PRN
  Filled 2019-10-27: qty 1

## 2019-10-27 MED ORDER — IOHEXOL 350 MG/ML SOLN
100.0000 mL | Freq: Once | INTRAVENOUS | Status: AC | PRN
  Administered 2019-10-27: 100 mL via INTRAVENOUS

## 2019-10-27 MED ORDER — THIAMINE HCL 100 MG PO TABS
100.0000 mg | ORAL_TABLET | Freq: Every day | ORAL | Status: DC
  Administered 2019-10-29 – 2019-11-02 (×4): 100 mg via ORAL
  Filled 2019-10-27 (×5): qty 1

## 2019-10-27 MED ORDER — ADULT MULTIVITAMIN W/MINERALS CH
1.0000 | ORAL_TABLET | Freq: Every day | ORAL | Status: DC
  Administered 2019-10-28 – 2019-11-02 (×6): 1 via ORAL
  Filled 2019-10-27 (×5): qty 1

## 2019-10-27 MED ORDER — THIAMINE HCL 100 MG/ML IJ SOLN
100.0000 mg | Freq: Every day | INTRAMUSCULAR | Status: DC
  Administered 2019-10-27 – 2019-11-01 (×3): 100 mg via INTRAVENOUS
  Filled 2019-10-27: qty 1
  Filled 2019-10-27: qty 2
  Filled 2019-10-27 (×6): qty 1

## 2019-10-27 MED ORDER — BUDESONIDE 180 MCG/ACT IN AEPB: 1.0000 | INHALATION_SPRAY | Freq: Two times a day (BID) | RESPIRATORY_TRACT | Status: DC

## 2019-10-27 MED ORDER — HYDROXYZINE HCL 50 MG PO TABS
50.0000 mg | ORAL_TABLET | Freq: Four times a day (QID) | ORAL | Status: DC | PRN
  Filled 2019-10-27: qty 1

## 2019-10-27 MED ORDER — LINACLOTIDE 145 MCG PO CAPS
145.0000 ug | ORAL_CAPSULE | Freq: Every day | ORAL | Status: DC | PRN
  Filled 2019-10-27: qty 1

## 2019-10-27 MED ORDER — ENOXAPARIN SODIUM 80 MG/0.8ML ~~LOC~~ SOLN
70.0000 mg | SUBCUTANEOUS | Status: DC
  Administered 2019-10-28 – 2019-11-02 (×6): 70 mg via SUBCUTANEOUS
  Filled 2019-10-27 (×6): qty 0.8

## 2019-10-27 MED ORDER — NICOTINE 14 MG/24HR TD PT24
14.0000 mg | MEDICATED_PATCH | Freq: Every day | TRANSDERMAL | Status: DC
  Administered 2019-10-27 – 2019-10-30 (×4): 14 mg via TRANSDERMAL
  Filled 2019-10-27 (×5): qty 1

## 2019-10-27 MED ORDER — FOLIC ACID 1 MG PO TABS
1.0000 mg | ORAL_TABLET | Freq: Every day | ORAL | Status: DC
  Administered 2019-10-28 – 2019-11-02 (×6): 1 mg via ORAL
  Filled 2019-10-27 (×6): qty 1

## 2019-10-27 MED ORDER — VALACYCLOVIR HCL 500 MG PO TABS
1000.0000 mg | ORAL_TABLET | Freq: Every day | ORAL | Status: DC
  Administered 2019-10-28 – 2019-11-02 (×6): 1000 mg via ORAL
  Filled 2019-10-27 (×7): qty 2

## 2019-10-27 MED ORDER — ALBUTEROL SULFATE HFA 108 (90 BASE) MCG/ACT IN AERS
4.0000 | INHALATION_SPRAY | Freq: Once | RESPIRATORY_TRACT | Status: AC
  Administered 2019-10-27: 4 via RESPIRATORY_TRACT
  Filled 2019-10-27: qty 6.7

## 2019-10-27 MED ORDER — ACETAMINOPHEN 325 MG PO TABS
650.0000 mg | ORAL_TABLET | Freq: Four times a day (QID) | ORAL | Status: DC | PRN
  Filled 2019-10-27: qty 2

## 2019-10-27 MED ORDER — BUDESONIDE 180 MCG/ACT IN AEPB
2.0000 | INHALATION_SPRAY | Freq: Two times a day (BID) | RESPIRATORY_TRACT | Status: DC
  Administered 2019-10-28 – 2019-11-02 (×10): 2 via RESPIRATORY_TRACT
  Filled 2019-10-27 (×2): qty 1

## 2019-10-27 MED ORDER — IPRATROPIUM BROMIDE HFA 17 MCG/ACT IN AERS
2.0000 | INHALATION_SPRAY | Freq: Four times a day (QID) | RESPIRATORY_TRACT | Status: DC
  Administered 2019-10-28 – 2019-11-02 (×20): 2 via RESPIRATORY_TRACT
  Filled 2019-10-27: qty 12.9

## 2019-10-27 MED ORDER — POTASSIUM CHLORIDE 10 MEQ/100ML IV SOLN
10.0000 meq | INTRAVENOUS | Status: AC
  Administered 2019-10-27 (×2): 10 meq via INTRAVENOUS
  Filled 2019-10-27 (×2): qty 100

## 2019-10-27 MED ORDER — SODIUM CHLORIDE 0.9 % IV SOLN
1.0000 g | INTRAVENOUS | Status: DC
  Administered 2019-10-27 – 2019-11-01 (×6): 1 g via INTRAVENOUS
  Filled 2019-10-27 (×6): qty 10

## 2019-10-27 NOTE — H&P (Addendum)
History and Physical        Hospital Admission Note Date: 10/27/2019  Patient name: [name] Medical record number: [id number] Date of birth: [birth year] Age: 34 y.o. Gender: male  PCP: [name], MD    Patient coming from: Home  I have reviewed all records in the [facility].    Chief Complaint:  Shortness of breath   HPI: Patient is a 34 year old male with history of controlled HIV on medications, bipolar disorder, schizophrenia, seizures presented to ED with worsening shortness of breath and wheezing for the last 3 days.  Patient is diffusely wheezing at the time of my examination, hypoxic on 5 L O2.  Patient reported that he had fever and chills 101 F, nausea, coughing, shortness of breath, progressively worsening in the last 3 days.  States he lives alone, denies any known exposure to Covid.  Had a negative Covid test last month. Poor appetite, denied any diarrhea.  History is limited as patient was short of breath.  Patient also reports that he smokes half a pack a day.  Point-of-care Covid test is negative, confirmatory test pending  ED work-up/course:  In ED 100.3 F, tachycardiac 108, BP 166/89 O2 sats 98% on 5 L O2, 91% on 4 L  Labs showed sodium 131, potassium 2.9, BUN 14, creatinine 1.1 AST 206 ALT 58 LDH 458, ferritin 797, CRP 13.0, lactic acid 3.2, procalcitonin 0.72, D-dimer 1.45, fibrinogen 521 Chest x-ray showed bilateral multifocal pneumonia  Review of Systems: Positives marked in 'bold' Constitutional: + fever, chills, diaphoresis, poor appetite and fatigue.  HEENT: Denies photophobia, eye pain, redness, hearing loss, ear pain, congestion, sore throat, rhinorrhea, sneezing, mouth sores, trouble swallowing, neck pain, neck stiffness and tinnitus.   Respiratory: See HPI, diffuse wheezing and tightness Cardiovascular: Denies chest  pain, palpitations and leg swelling.  Gastrointestinal: Please see HPI Genitourinary: Denies dysuria, urgency, frequency, hematuria, flank pain and difficulty urinating.  Musculoskeletal: Denies myalgias, back pain, joint swelling, arthralgias and gait problem.  Skin: Denies pallor, rash and wound.  Neurological: Denies dizziness, seizures, syncope, light-headedness, numbness and headaches. + Generalized weakness Hematological: Denies adenopathy. Easy bruising, personal or family bleeding history  Psychiatric/Behavioral: Denies suicidal ideation, mood changes, confusion, nervousness, sleep disturbance and agitation  Past Medical History: Past Medical History:  Diagnosis Date  . Bipolar 1 disorder ([place])   . Depression   . Dizziness and giddiness 02/01/2016  . Herpes genitalia   . HIV disease ([place])   . Hypertension   . Migraine headache 02/01/2016  . Peripheral neuropathy 10/01/2019  . PTSD (post-traumatic stress disorder)   . Schizoaffective disorder ([place])   . Seizures ([place])     Past Surgical History:  Procedure Laterality Date  . BACK SURGERY    . HAND SURGERY      Medications: Prior to Admission medications   Medication Sig Start Date End Date Taking? Authorizing Provider  albuterol (PROVENTIL HFA;VENTOLIN HFA) 108 (90 BASE) MCG/ACT inhaler Inhale 2 puffs into the lungs every 6 (six) hours as needed for wheezing or shortness of breath.   Yes [provider]  alprazolam Duanne Moron) 2 MG tablet Take 2 mg by mouth 3 (three) times daily.  Yes [provider]  benzonatate (TESSALON) 100 MG capsule Take 200 mg by mouth every 8 (eight) hours as needed for cough. 10/23/19  Yes [provider]  bictegravir-emtricitabine-tenofovir AF (BIKTARVY) 50-200-25 MG TABS tablet Take 1 tablet by mouth daily.  02/24/17  Yes [provider]  cetirizine (ZYRTEC) 10 MG tablet Take 1 tablet (10 mg total) by mouth daily. 05/15/15  Yes Niel Hummer, NP  famotidine (PEPCID)  40 MG tablet Take 40 mg by mouth daily.   Yes [provider]  FLUoxetine (PROZAC) 40 MG capsule Take 40 mg by mouth daily.   Yes [provider]  folic acid (FOLVITE) 1 MG tablet Take 1 mg by mouth daily. 10/03/19  Yes [provider]  haloperidol (HALDOL) 5 MG tablet Take 5 mg by mouth 2 (two) times daily. 06/19/19  Yes [provider]  hydrOXYzine (ATARAX/VISTARIL) 50 MG tablet Take 1 tablet (50 mg total) by mouth every 6 (six) hours as needed for anxiety. 07/25/18  Yes Pennelope Bracken, MD  linaclotide Rolan Lipa) 145 MCG CAPS capsule Take 145 mcg by mouth daily as needed (constipation).   Yes [provider]  lurasidone 120 MG TABS Take 1 tablet (120 mg total) by mouth daily. 05/15/15  Yes Niel Hummer, NP  ondansetron (ZOFRAN) 8 MG tablet Take 8 mg by mouth every 8 (eight) hours as needed for nausea/vomiting. 10/03/19  Yes [provider]  PARoxetine (PAXIL) 20 MG tablet Take 20 mg by mouth at bedtime. 06/19/19  Yes [provider]  thiamine (VITAMIN B-1) 100 MG tablet Take 100 mg by mouth daily.  01/31/19  Yes [provider]  topiramate (TOPAMAX) 100 MG tablet Take 1 tablet (100 mg total) by mouth at bedtime. Patient taking differently: Take 75 mg by mouth at bedtime.  07/01/19  Yes Suzzanne Cloud, NP  traZODone (DESYREL) 300 MG tablet Take 1 tablet (300 mg total) by mouth at bedtime as needed for sleep. Patient taking differently: Take 300 mg by mouth at bedtime.  05/15/15  Yes Niel Hummer, NP  valACYclovir (VALTREX) 1000 MG tablet Take 1 tablet (1,000 mg total) by mouth daily. 05/15/15  Yes Niel Hummer, NP  Vitamin D, Ergocalciferol, (DRISDOL) 1.25 MG (50000 UNIT) CAPS capsule Take 50,000 Units by mouth once a week. 10/03/19  Yes [provider]  zolpidem (AMBIEN) 10 MG tablet Take 10 mg by mouth at bedtime. 09/06/19  Yes [provider]    Allergies:   Allergies  Allergen Reactions  . Dapsone  Other (See Comments)    Per centricity "G6PD deficient"  . Primaquine Phosphate Other (See Comments)    Per Centricity "G6PD deficient"    Social History:  reports that he has been smoking cigarettes. He has been smoking about 1.00 pack per day. He has never used smokeless tobacco. He reports current alcohol use of about 14.0 standard drinks of alcohol per week. He reports that he does not use drugs.  Family History: Family History  Problem Relation Age of Onset  . Alcohol abuse Mother   . Schizophrenia Father   . Depression Father   . Alcohol abuse Father   . Alcohol abuse Paternal Uncle   . Alcohol abuse Paternal Uncle     Physical Exam: Blood pressure (!) 154/92, pulse 89, temperature 100.3 F (37.9 C), temperature source Oral, resp. rate (!) 24, height 6' 3"  (1.905 m), weight (!) 137.4 kg, SpO2 98 %. General: Alert, awake, oriented x3, in moderate  respiratory distress, on 5 L O2, visibly short of breath and audible wheezing Eyes: pink conjunctiva,anicteric sclera, pupils equal and reactive to light and accomodation, HEENT: normocephalic, atraumatic, oropharynx clear Neck: supple, no masses or lymphadenopathy, no goiter, no bruits, no JVD CVS: Regular rate and rhythm, without murmurs, rubs or gallops. No lower extremity edema Resp : Diffuse expiratory wheezing bilaterally GI : Soft, nontender, nondistended, positive bowel sounds, no masses. No hepatomegaly. No hernia.  Musculoskeletal: No clubbing or cyanosis, positive pedal pulses. No contracture. ROM intact  Neuro: Grossly intact, no focal neurological deficits, strength 5/5 upper and lower extremities bilaterally Psych: alert and oriented x 3, normal mood and affect Skin: no rashes or lesions, warm and dry   LABS on Admission: I have personally reviewed all the labs and imagings below    Basic Metabolic Panel: Recent Labs  Lab 10/27/19 1102  NA 131*  K 2.9*  CL 97*  CO2 19*  GLUCOSE 75  BUN 14  CREATININE 1.12    CALCIUM 8.6*   Liver Function Tests: Recent Labs  Lab 10/27/19 1102  AST 206*  ALT 58*  ALKPHOS 69  BILITOT 1.5*  PROT 6.8  ALBUMIN 2.5*   No results for input(s): LIPASE, AMYLASE in the last 168 hours. No results for input(s): AMMONIA in the last 168 hours. CBC: Recent Labs  Lab 10/27/19 1102  WBC 8.8  NEUTROABS 6.5  HGB 10.9*  HCT 33.9*  MCV 105.3*  PLT 127*   Cardiac Enzymes: No results for input(s): CKTOTAL, CKMB, CKMBINDEX, TROPONINI in the last 168 hours. BNP: Invalid input(s): POCBNP CBG: No results for input(s): GLUCAP in the last 168 hours.  Radiological Exams on Admission:  DG Chest Port 1 View  Result Date: 10/27/2019 CLINICAL DATA:  Shortness of breath. EXAM: PORTABLE CHEST 1 VIEW COMPARISON:  October 15, 2014. FINDINGS: Stable cardiomediastinal silhouette. No pneumothorax is noted. Multiple ill-defined airspace opacities are noted bilaterally consistent with multifocal pneumonia. Bony thorax is unremarkable. IMPRESSION: Bilateral multifocal pneumonia. Electronically Signed   By: Marijo Conception M.D.   On: 10/27/2019 12:04      EKG: Independently reviewed. *Rate 111, prolonged QTC 522, early repolarization changes   Assessment/Plan Principal Problem:   Acute hypoxic respiratory failure -Confirmatory Covid test is pending, point of care is negative however chest x-ray shows multifocal pneumonia with classic Covid symptoms, elevated inflammatory markers.  Patient will be admitted as PUI until confirmatory Covid test results back.  Currently on 5 L O2 via nasal cannula -Diffuse wheezing, placed on Solu-Medrol IV 60 mg every 12 hours, scheduled albuterol inhaler, Pulmicort.  -Multifocal pneumonia, elevated procalcitonin, possibility of coexisting bacterial infection, placed on IV Rocephin and Zithromax -CRP elevated at 13.0, elevated D-dimer, ferritin, LDH.  Awaiting Covid confirmatory test, will place on IV remdesivir, convalescent plasma pending the  results. -Continue airborne and contact her questions Addendum 3:50 PM Confirmatory COVID-19 test positive Patient on 5 L, started on remdesivir, will give convalescent plasma x1, Actemra x1.  Discussed about Actemra, risks and benefits, procalcitonin 0.7, on IV antibiotics.  AST borderline up 206, has history of HIV on Biktarvy.  Will discuss with pharmacy if patient meets criteria   Active Problems: Nicotine use -Patient reports half pack per day smoking, counseled on smoking cessation.  May have underlying COPD -Placed on scheduled nebs, continue IV steroids, nicotine patch    Schizoaffective disorder, depressive type (HCC) -Continue fluoxetine, alprazolam, lurasidone, trazodone as needed -Hold Haldol due to prolonged QTC  History of HIV -Continue  Biktarvy    GERD (gastroesophageal reflux disease) -Continue Pepcid    Hypokalemia -Obtain magnesium level, placed on potassium replacement  Alcohol use -States that he drinks alcohol occasionally, will place on CIWA with Ativan as needed.  Patient is on scheduled Xanax per his home medications.  History of seizures -Continue topiramate  DVT prophylaxis: Lovenox  CODE STATUS: Full CODE STATUS, discussed with the patient  Consults called: None   Admission status: Stepdown, inpatient.  [facility] if [facility]  The medical decision making on this patient was of high complexity and the patient is at high risk for clinical deterioration, therefore this is a level 3 admission.  Severity of Illness:      The appropriate patient status for this patient is INPATIENT. Inpatient status is judged to be reasonable and necessary in order to provide the required intensity of service to ensure the patient's safety. The patient's presenting symptoms, physical exam findings, and initial radiographic and laboratory data in the context of their chronic comorbidities is felt to place them at high risk for further clinical deterioration.  Furthermore, it is not anticipated that the patient will be medically stable for discharge from the hospital within 2 midnights of admission. The following factors support the patient status of inpatient.   " The patient's presenting symptoms include acute respiratory failure with hypoxia, audible wheezing, short of breath " The worrisome physical exam findings include wheezing, shortness of breath, hypoxia " The initial radiographic and laboratory data are worrisome because of elevated inflammatory markers, COVID-19 test pending, multifocal pneumonia " The chronic co-morbidities include nicotine use, schizophrenia, GERD   * I certify that at the point of admission it is my clinical judgment that the patient will require inpatient hospital care spanning beyond 2 midnights from the point of admission due to high intensity of service, high risk for further deterioration and high frequency of surveillance required.*    Time Spent on Admission: 72mns      [name] M.D. [facility] 10/27/2019, 1:14 PM

## 2019-10-27 NOTE — Progress Notes (Addendum)
Code stroke  Initial NIH: 12  NIH Reassessment: 3.   Somnulent, however oriented x4 4+/5 LUE 4/5 LLE 5/5 RUE 4+/5 RLE Pupils 5 mm PERRLA Visual fields intact EOMs intact Sensation diminished in LLE, but Mr. Nolden says this is chronic Weak voice, but not dysphasic Difficulty staying awake for more than 2-3 minutes  BP 118/69 (84) HR 93 NSR RR 24 SpO2 98 via 3L [place]  ABD distended, generalized non pitting edema  .20 LFA already established for CTA  No respiratory distress, however intermittently obstructs when falling asleep with no associated hypoxia at this time.   Dr. Andria Frames and Dr. Rory Percy at bedside via stroke cart.  CTA head and neck ordered and completed.  Transported by PCU charge RN, ICU charge RN, and [facility].  Dr. Rory Percy called after CTA read and determined no further intervention necessary.  Will remain in PCU at this time. Mr. Brubacher will need to be monitored for progression of AMS and respiratory status, I would be careful when considering CPAP or BiPAP given somnolence at this time.    136/78 (95) HR 100 NSR RR 25 SpO2 93 4 L [place]

## 2019-10-27 NOTE — ED Triage Notes (Signed)
Patient here from home reporting SOB x3 days. Neg Covid test last month. 89% RA. Has not slept in 3 days due to SOB. Fever. Tylenol given in route.

## 2019-10-27 NOTE — ED Notes (Signed)
ED TO INPATIENT HANDOFF REPORT  ED Nurse Name and Phone #: [phone]  S Name/Age/Gender [name] 34 y.o. male Room/Bed: WA10/WA10  Code Status   Code Status: Prior  Home/SNF/Other Home Patient oriented to: self Is this baseline? Yes   Triage Complete: Triage complete  Chief Complaint Acute respiratory failure due to COVID-19 ([place]) [U07.1, J96.00]  Triage Note Patient here from home reporting SOB x3 days. Neg Covid test last month. 89% RA. Has not slept in 3 days due to SOB. Fever. Tylenol given in route.     Allergies Allergies  Allergen Reactions  . Dapsone Other (See Comments)    Per centricity "G6PD deficient"  . Primaquine Phosphate Other (See Comments)    Per Centricity "G6PD deficient"    Level of Care/Admitting Diagnosis ED Disposition    ED Disposition Condition [facility]: [place] [[id number]]  Level of Care: Progressive [102]  Covid Evaluation: Confirmed COVID Positive  Diagnosis: Acute respiratory failure due to COVID-19 [facility]) [[id number]]  Admitting Physician: [name] [[id number]]  Attending Physician: RAI, RIPUDEEP K [[id number]]  Estimated length of stay: past midnight tomorrow  Certification:: I certify this patient will need inpatient services for at least 2 midnights       B Medical/Surgery History Past Medical History:  Diagnosis Date  . Bipolar 1 disorder ([place])   . Depression   . Dizziness and giddiness 02/01/2016  . Herpes genitalia   . HIV disease ([place])   . Hypertension   . Migraine headache 02/01/2016  . Peripheral neuropathy 10/01/2019  . PTSD (post-traumatic stress disorder)   . Schizoaffective disorder ([place])   . Seizures ([place])    Past Surgical History:  Procedure Laterality Date  . BACK SURGERY    . HAND SURGERY       A IV Location/Drains/Wounds Patient Lines/Drains/Airways Status   Active Line/Drains/Airways    Name:   Placement date:   Placement time:   Site:   Days:    Peripheral IV 10/27/19 Left Antecubital   10/27/19    1119    Antecubital   less than 1   Peripheral IV 10/27/19 Left Arm   10/27/19    1501    Arm   less than 1          Intake/Output Last 24 hours  Intake/Output Summary (Last 24 hours) at 10/27/2019 1702 Last data filed at 10/27/2019 1530 Gross per 24 hour  Intake 100 ml  Output --  Net 100 ml    Labs/Imaging Results for orders placed or performed during the hospital encounter of 10/27/19 (from the past 48 hour(s))  Lactic acid, plasma     Status: Abnormal   Collection Time: 10/27/19 11:02 AM  Result Value Ref Range   Lactic Acid, Venous 3.2 (HH) 0.5 - 1.9 mmol/L    Comment: CRITICAL RESULT CALLED TO, READ BACK BY AND VERIFIED WITH: Marcy Panning RN 1151 10/27/19 JM Performed at [facility], [place] [address], [place], [place] [address]   CBC WITH DIFFERENTIAL     Status: Abnormal   Collection Time: 10/27/19 11:02 AM  Result Value Ref Range   WBC 8.8 4.0 - 10.5 K/uL   RBC 3.22 (L) 4.22 - 5.81 MIL/uL   Hemoglobin 10.9 (L) 13.0 - 17.0 g/dL   HCT 33.9 (L) 39.0 - 52.0 %   MCV 105.3 (H) 80.0 - 100.0 fL   MCH 33.9 26.0 - 34.0 pg   MCHC 32.2  30.0 - 36.0 g/dL   RDW 15.0 11.5 - 15.5 %   Platelets 127 (L) 150 - 400 K/uL    Comment: Immature Platelet Fraction may be clinically indicated, consider ordering this additional test [id number]    nRBC 0.0 0.0 - 0.2 %   Neutrophils Relative % 73 %   Neutro Abs 6.5 1.7 - 7.7 K/uL   Lymphocytes Relative 10 %   Lymphs Abs 0.9 0.7 - 4.0 K/uL   Monocytes Relative 12 %   Monocytes Absolute 1.0 0.1 - 1.0 K/uL   Eosinophils Relative 0 %   Eosinophils Absolute 0.0 0.0 - 0.5 K/uL   Basophils Relative 1 %   Basophils Absolute 0.1 0.0 - 0.1 K/uL   Immature Granulocytes 4 %   Abs Immature Granulocytes 0.35 (H) 0.00 - 0.07 K/uL    Comment: Performed at [facility], [place] [address] [place], [place] [address]  Comprehensive metabolic panel     Status: Abnormal    Collection Time: 10/27/19 11:02 AM  Result Value Ref Range   Sodium 131 (L) 135 - 145 mmol/L   Potassium 2.9 (L) 3.5 - 5.1 mmol/L   Chloride 97 (L) 98 - 111 mmol/L   CO2 19 (L) 22 - 32 mmol/L   Glucose, Bld 75 70 - 99 mg/dL   BUN 14 6 - 20 mg/dL   Creatinine, Ser 1.12 0.61 - 1.24 mg/dL   Calcium 8.6 (L) 8.9 - 10.3 mg/dL   Total Protein 6.8 6.5 - 8.1 g/dL   Albumin 2.5 (L) 3.5 - 5.0 g/dL   AST 206 (H) 15 - 41 U/L   ALT 58 (H) 0 - 44 U/L   Alkaline Phosphatase 69 38 - 126 U/L   Total Bilirubin 1.5 (H) 0.3 - 1.2 mg/dL   GFR calc non Af Amer >60 >60 mL/min   GFR calc Af Amer >60 >60 mL/min   Anion gap 15 5 - 15    Comment: Performed at [facility], [place] [address], [place], [place] [address]  D-dimer, quantitative     Status: Abnormal   Collection Time: 10/27/19 11:02 AM  Result Value Ref Range   D-Dimer, Quant 1.45 (H) 0.00 - 0.50 ug/mL-FEU    Comment: (NOTE) At the manufacturer cut-off of 0.50 ug/mL FEU, this assay has been documented to exclude PE with a sensitivity and negative predictive value of 97 to 99%.  At this time, this assay has not been approved by the FDA to exclude DVT/VTE. Results should be correlated with clinical presentation. Performed at [facility], [place] [address] [place], [place] [address]   Procalcitonin     Status: None   Collection Time: 10/27/19 11:02 AM  Result Value Ref Range   Procalcitonin 0.72 ng/mL    Comment:        Interpretation: PCT > 0.5 ng/mL and <= 2 ng/mL: Systemic infection (sepsis) is possible, but other conditions are known to elevate PCT as well. (NOTE)       Sepsis PCT Algorithm           Lower Respiratory Tract                                      Infection PCT Algorithm    ----------------------------     ----------------------------         PCT < 0.25 ng/mL  PCT < 0.10 ng/mL         Strongly encourage             Strongly discourage   discontinuation of antibiotics     initiation of antibiotics    ----------------------------     -----------------------------       PCT 0.25 - 0.50 ng/mL            PCT 0.10 - 0.25 ng/mL               OR       >80% decrease in PCT            Discourage initiation of                                            antibiotics      Encourage discontinuation           of antibiotics    ----------------------------     -----------------------------         PCT >= 0.50 ng/mL              PCT 0.26 - 0.50 ng/mL                AND       <80% decrease in PCT             Encourage initiation of                                             antibiotics       Encourage continuation           of antibiotics    ----------------------------     -----------------------------        PCT >= 0.50 ng/mL                  PCT > 0.50 ng/mL               AND         increase in PCT                  Strongly encourage                                      initiation of antibiotics    Strongly encourage escalation           of antibiotics                                     -----------------------------                                           PCT <= 0.25 ng/mL                                                 OR                                        >  80% decrease in PCT                                     Discontinue / Do not initiate                                             antibiotics Performed at [place] [address], [place], [place] [address]   Lactate dehydrogenase     Status: Abnormal   Collection Time: 10/27/19 11:02 AM  Result Value Ref Range   LDH 458 (H) 98 - 192 U/L    Comment: Performed at [facility], [place] [address], [place], [place] [address]  Ferritin     Status: Abnormal   Collection Time: 10/27/19 11:02 AM  Result Value Ref Range   Ferritin 797 (H) 24 - 336 ng/mL    Comment: Performed at [facility], [place] [address], [place], [place] [address]   Triglycerides     Status: None   Collection Time: 10/27/19 11:02 AM  Result Value Ref Range   Triglycerides 94 <150 mg/dL    Comment: Performed at [facility], [place] [address] [place], [place] [address]  Fibrinogen     Status: Abnormal   Collection Time: 10/27/19 11:02 AM  Result Value Ref Range   Fibrinogen 521 (H) 210 - 475 mg/dL    Comment: Performed at [facility], [place] [address], [place], [place] [address]  C-reactive protein     Status: Abnormal   Collection Time: 10/27/19 11:02 AM  Result Value Ref Range   CRP 13.0 (H) <1.0 mg/dL    Comment: Performed at [facility], [place] [address] [place], [place] [address]  Ethanol     Status: None   Collection Time: 10/27/19 11:02 AM  Result Value Ref Range   Alcohol, Ethyl (B) <10 <10 mg/dL    Comment: (NOTE) Lowest detectable limit for serum alcohol is 10 mg/dL. For medical purposes only. Performed at [facility], [place] [address], [place], [place] [address]   Blood gas, venous (at [facility] and [facility], not at [facility])     Status: Abnormal   Collection Time: 10/27/19 11:09 AM  Result Value Ref Range   FIO2 100.00    pH, Ven 7.356 7.250 - 7.430   pCO2, Ven 42.5 (L) 44.0 - 60.0 mmHg   pO2, Ven 125.0 (H) 32.0 - 45.0 mmHg   Bicarbonate 23.2 20.0 - 28.0 mmol/L   Acid-base deficit 1.7 0.0 - 2.0 mmol/L   O2 Saturation 97.8 %   Patient temperature 98.6     Comment: Performed at [facility], [place] [address], [place], [place] [address]  POC SARS Coronavirus 2 Ag-ED - Nasal Swab (BD Veritor Kit)     Status: None   Collection Time: 10/27/19 11:46 AM  Result Value Ref Range   SARS Coronavirus 2 Ag NEGATIVE NEGATIVE    Comment: (NOTE) SARS-CoV-2 antigen NOT DETECTED.  Negative results are presumptive.  Negative results do not preclude SARS-CoV-2 infection and should not be used as the sole basis for treatment or other patient management decisions,  including infection  control decisions, particularly in the presence of clinical signs and  symptoms consistent with COVID-19, or in those who have  been in contact with the virus.  Negative results must be combined with clinical observations, patient history, and epidemiological information. The expected result is Negative. Fact Sheet for Patients: PodPark.tn Fact Sheet for Healthcare Providers: GiftContent.is This test is not yet approved or cleared by the [place] FDA and  has been authorized for detection and/or diagnosis of SARS-CoV-2 by FDA under an Emergency Use Authorization (EUA).  This EUA will remain in effect (meaning this test can be used) for the duration of  the COVID-19 de claration under Section 564(b)(1) of the Act, 21 U.S.C. section 360bbb-3(b)(1), unless the authorization is terminated or revoked sooner.   Lactic acid, plasma     Status: Abnormal   Collection Time: 10/27/19 12:32 PM  Result Value Ref Range   Lactic Acid, Venous 2.3 (HH) 0.5 - 1.9 mmol/L    Comment: CRITICAL RESULT CALLED TO, READ BACK BY AND VERIFIED WITH: M.BOWDEN,RN [id number] @1335  BY V.WILKINS Performed at [facility], [place] [address], [place], [place] [address]   Respiratory Panel by RT PCR (Flu A&B, Covid) - Nasopharyngeal Swab     Status: Abnormal   Collection Time: 10/27/19 12:32 PM   Specimen: Nasopharyngeal Swab  Result Value Ref Range   SARS Coronavirus 2 by RT PCR POSITIVE (A) NEGATIVE    Comment: RESULT CALLED TO, READ BACK BY AND VERIFIED WITH: Q.MILNER,RN [id number] @1455  BY V.WILKINS (NOTE) SARS-CoV-2 target nucleic acids are DETECTED. SARS-CoV-2 RNA is generally detectable in upper respiratory specimens  during the acute phase of infection. Positive results are indicative of the presence of the identified virus, but do not rule out bacterial infection or co-infection with other pathogens not detected by  the test. Clinical correlation with patient history and other diagnostic information is necessary to determine patient infection status. The expected result is Negative. Fact Sheet for Patients:  PinkCheek.be Fact Sheet for Healthcare Providers: GravelBags.it This test is not yet approved or cleared by the [place] FDA and  has been authorized for detection and/or diagnosis of SARS-CoV-2 by FDA under an Emergency Use Authorization (EUA).  This EUA will remain in effect (meaning this test can be used)  for the duration of  the COVID-19 declaration under Section 564(b)(1) of the Act, 21 U.S.C. section 360bbb-3(b)(1), unless the authorization is terminated or revoked sooner.    Influenza A by PCR NEGATIVE NEGATIVE   Influenza B by PCR NEGATIVE NEGATIVE    Comment: (NOTE) The Xpert Xpress SARS-CoV-2/FLU/RSV assay is intended as an aid in  the diagnosis of influenza from Nasopharyngeal swab specimens and  should not be used as a sole basis for treatment. Nasal washings and  aspirates are unacceptable for Xpert Xpress SARS-CoV-2/FLU/RSV  testing. Fact Sheet for Patients: PinkCheek.be Fact Sheet for Healthcare Providers: GravelBags.it This test is not yet approved or cleared by the [place] FDA and  has been authorized for detection and/or diagnosis of SARS-CoV-2 by  FDA under an Emergency Use Authorization (EUA). This EUA will remain  in effect (meaning this test can be used) for the duration of the  Covid-19 declaration under Section 564(b)(1) of the Act, 21  U.S.C. section 360bbb-3(b)(1), unless the authorization is  terminated or revoked. Performed at [facility], [place] [address] [place], [place] [address]   Magnesium     Status: None   Collection Time: 10/27/19 12:32 PM  Result Value Ref Range   Magnesium 2.4 1.7 - 2.4 mg/dL    Comment:  Performed at [facility],  [place] [address], [place], [place] [address]   DG Chest Port 1 View  Result Date: 10/27/2019 CLINICAL DATA:  Shortness of breath. EXAM: PORTABLE CHEST 1 VIEW COMPARISON:  October 15, 2014. FINDINGS: Stable cardiomediastinal silhouette. No pneumothorax is noted. Multiple ill-defined airspace opacities are noted bilaterally consistent with multifocal pneumonia. Bony thorax is unremarkable. IMPRESSION: Bilateral multifocal pneumonia. Electronically Signed   By: Marijo Conception M.D.   On: 10/27/2019 12:04    Pending Labs Unresulted Labs (From admission, onward)    Start     Ordered   10/27/19 1548  Prepare fresh frozen plasma  (Adult Blood Administration - Fresh Frozen Plasma)  Once,   R    Question Answer Comment  Number of Units 1   Transfusion Indications Other (Specify)   Special Requirements Covid Convalescent Plasma   If emergent release call blood bank [facility] [phone]      10/27/19 1547   10/27/19 1547  Type and screen  Once,   STAT     10/27/19 1547   10/27/19 1054  Blood Culture (routine x 2)  BLOOD CULTURE X 2,   STAT     10/27/19 1055   Signed and Held  Basic metabolic panel  Tomorrow morning,   R     Signed and Held   Signed and Held  CBC  Tomorrow morning,   R     Signed and Held   Signed and Held  CBC  (enoxaparin (LOVENOX)    CrCl >/= 30 ml/min)  Once,   R    Comments: Baseline for enoxaparin therapy IF NOT ALREADY DRAWN.  Notify MD if PLT < 100 K.    Signed and Held   Signed and Held  Creatinine, serum  (enoxaparin (LOVENOX)    CrCl >/= 30 ml/min)  Once,   R    Comments: Baseline for enoxaparin therapy IF NOT ALREADY DRAWN.    Signed and Held   Signed and Held  Creatinine, serum  (enoxaparin (LOVENOX)    CrCl >/= 30 ml/min)  Weekly,   R    Comments: while on enoxaparin therapy    Signed and Held          Vitals/Pain Today's Vitals   10/27/19 1204 10/27/19 1231 10/27/19 1234 10/27/19 1328  BP:  (!) 154/92   (!) 166/89  Pulse: (!) 115 89  (!) 110  Resp:  (!) 24  20  Temp: 100.3 F (37.9 C)     TempSrc: Oral     SpO2: (!) 88% 98%  95%  Weight:   (!) 137.4 kg   Height:   6' 3"  (1.905 m)   PainSc:        Isolation Precautions Airborne and Contact precautions  Medications Medications  methylPREDNISolone sodium succinate (SOLU-MEDROL) 125 mg/2 mL injection 60 mg (60 mg Intravenous Given 10/27/19 1328)  albuterol (VENTOLIN HFA) 108 (90 Base) MCG/ACT inhaler 2 puff (2 puffs Inhalation Given 10/27/19 1329)  budesonide (PULMICORT) 180 MCG/ACT inhaler 2 puff (has no administration in time range)  cefTRIAXone (ROCEPHIN) 1 g in sodium chloride 0.9 % 100 mL IVPB (0 g Intravenous Stopped 10/27/19 1436)  azithromycin (ZITHROMAX) 500 mg in sodium chloride 0.9 % 250 mL IVPB (500 mg Intravenous New Bag/Given 10/27/19 1436)  0.9 %  sodium chloride infusion (Manually program via Guardrails IV Fluids) (has no administration in time range)  remdesivir 200 mg in sodium chloride 0.9% 250 mL IVPB (has no administration in time range)    Followed  by  remdesivir 100 mg in sodium chloride 0.9 % 100 mL IVPB (has no administration in time range)  tocilizumab (ACTEMRA) 800 mg in sodium chloride 0.9 % 100 mL infusion (has no administration in time range)  sodium chloride 0.9 % bolus 500 mL (0 mLs Intravenous Stopped 10/27/19 1219)  albuterol (VENTOLIN HFA) 108 (90 Base) MCG/ACT inhaler 4 puff (4 puffs Inhalation Given 10/27/19 1121)  potassium chloride 10 mEq in 100 mL IVPB (0 mEq Intravenous Stopped 10/27/19 1530)  dexamethasone (DECADRON) injection 10 mg (10 mg Intravenous Given 10/27/19 1300)  potassium chloride SA (KLOR-CON) CR tablet 20 mEq (20 mEq Oral Given 10/27/19 1300)    Mobility walks Low fall risk   Focused Assessments .   R Recommendations: See Admitting Provider Note  Report given to:   Additional Notes: n/a

## 2019-10-27 NOTE — Plan of Care (Signed)
Problem: Education: Goal: Knowledge of General Education information will improve Description: Including pain rating scale, medication(s)/side effects and non-pharmacologic comfort measures 10/27/2019 1816 by Barrie Folk, RN Outcome: Progressing 10/27/2019 1815 by Barrie Folk, RN Outcome: Progressing   Problem: Health Behavior/Discharge Planning: Goal: Ability to manage health-related needs will improve 10/27/2019 1816 by Barrie Folk, RN Outcome: Progressing 10/27/2019 1815 by Barrie Folk, RN Outcome: Progressing   Problem: Clinical Measurements: Goal: Ability to maintain clinical measurements within normal limits will improve 10/27/2019 1816 by Barrie Folk, RN Outcome: Progressing 10/27/2019 1815 by Noralee Chars D, RN Outcome: Progressing Goal: Will remain free from infection 10/27/2019 1816 by Barrie Folk, RN Outcome: Progressing 10/27/2019 1815 by Noralee Chars D, RN Outcome: Progressing Goal: Diagnostic test results will improve 10/27/2019 1816 by Barrie Folk, RN Outcome: Progressing 10/27/2019 1815 by Noralee Chars D, RN Outcome: Progressing Goal: Respiratory complications will improve 10/27/2019 1816 by Barrie Folk, RN Outcome: Progressing 10/27/2019 1815 by Noralee Chars D, RN Outcome: Progressing Goal: Cardiovascular complication will be avoided 10/27/2019 1816 by Barrie Folk, RN Outcome: Progressing 10/27/2019 1815 by Barrie Folk, RN Outcome: Progressing   Problem: Activity: Goal: Risk for activity intolerance will decrease 10/27/2019 1816 by Barrie Folk, RN Outcome: Progressing 10/27/2019 1815 by Barrie Folk, RN Outcome: Progressing   Problem: Nutrition: Goal: Adequate nutrition will be maintained 10/27/2019 1816 by Barrie Folk, RN Outcome: Progressing 10/27/2019 1815 by Noralee Chars D, RN Outcome: Progressing   Problem: Coping: Goal: Level of anxiety will decrease 10/27/2019  1816 by Barrie Folk, RN Outcome: Progressing 10/27/2019 1815 by Noralee Chars D, RN Outcome: Progressing   Problem: Elimination: Goal: Will not experience complications related to bowel motility 10/27/2019 1816 by Barrie Folk, RN Outcome: Progressing 10/27/2019 1815 by Noralee Chars D, RN Outcome: Progressing Goal: Will not experience complications related to urinary retention 10/27/2019 1816 by Barrie Folk, RN Outcome: Progressing 10/27/2019 1815 by Barrie Folk, RN Outcome: Progressing   Problem: Pain Managment: Goal: General experience of comfort will improve 10/27/2019 1816 by Barrie Folk, RN Outcome: Progressing 10/27/2019 1815 by Noralee Chars D, RN Outcome: Progressing   Problem: Safety: Goal: Ability to remain free from injury will improve 10/27/2019 1816 by Barrie Folk, RN Outcome: Progressing 10/27/2019 1815 by Noralee Chars D, RN Outcome: Progressing   Problem: Skin Integrity: Goal: Risk for impaired skin integrity will decrease 10/27/2019 1816 by Barrie Folk, RN Outcome: Progressing 10/27/2019 1815 by Barrie Folk, RN Outcome: Progressing   Problem: Activity: Goal: Ability to tolerate increased activity will improve 10/27/2019 1816 by Barrie Folk, RN Outcome: Progressing 10/27/2019 1815 by Barrie Folk, RN Outcome: Progressing   Problem: Respiratory: Goal: Ability to maintain a clear airway will improve 10/27/2019 1816 by Barrie Folk, RN Outcome: Progressing 10/27/2019 1815 by Noralee Chars D, RN Outcome: Progressing Goal: Levels of oxygenation will improve 10/27/2019 1816 by Barrie Folk, RN Outcome: Progressing 10/27/2019 1815 by Barrie Folk, RN Outcome: Progressing   Problem: Education: Goal: Knowledge of risk factors and measures for prevention of condition will improve 10/27/2019 1816 by Barrie Folk, RN Outcome: Progressing 10/27/2019 1815 by Barrie Folk,  RN Outcome: Progressing   Problem: Coping: Goal: Psychosocial and spiritual needs will be supported 10/27/2019 1816 by Barrie Folk, RN Outcome: Progressing 10/27/2019 1815 by Barrie Folk, RN Outcome: Progressing   Problem: Respiratory: Goal: Will maintain a patent airway 10/27/2019 1816 by  Noralee Chars D, RN Outcome: Progressing 10/27/2019 1815 by Barrie Folk, RN Outcome: Progressing Goal: Complications related to the disease process, condition or treatment will be avoided or minimized 10/27/2019 1816 by Barrie Folk, RN Outcome: Progressing 10/27/2019 1815 by Barrie Folk, RN Outcome: Progressing   Problem: Education: Goal: Understanding of discharge needs will improve 10/27/2019 1816 by Barrie Folk, RN Outcome: Progressing 10/27/2019 1815 by Barrie Folk, RN Outcome: Progressing   Problem: Health Behavior/Discharge Planning: Goal: Ability to identify changes in lifestyle to reduce recurrence of condition will improve 10/27/2019 1816 by Barrie Folk, RN Outcome: Progressing 10/27/2019 1815 by Barrie Folk, RN Outcome: Progressing   Problem: Physical Regulation: Goal: Complications related to the disease process, condition or treatment will be avoided or minimized 10/27/2019 1816 by Barrie Folk, RN Outcome: Progressing 10/27/2019 1815 by Barrie Folk, RN Outcome: Progressing

## 2019-10-27 NOTE — ED Notes (Signed)
[name] (mother) [phone] please call with update

## 2019-10-27 NOTE — Progress Notes (Addendum)
Seen on arrival to [facility]. Pt somnolent, unable to answer any questions. Awakens to voice, follows commands with upper and lower extremities, PERRL. O2 sats maintaining 93% on 5 L. His mental status change seems relatively acute as nursing was just working with him.  He ate maybe half his dinner.  He's on xanax chronically at home, but this wasn't given to him yet here.  I don't see any opiates/benzos given.  Will d/c ativan/CIWA orders for now.  Also d/c home xanax.  Give narcan x1 to see if he responds to this.  Will check venous blood gas.  Check blood sugar.  Hold all meds for now given mental status.  Start IV keppra with need to hold topiramate while he's npo (discussed with pharmacy)  Will need to closely monitor until his mental status improves.  If no improvement, or worsening, consider head imaging/further w/u. Will sign out to night MD and discuss with nursing.  Otherwise, continue steroids, remdesivir, actemra for COVID per Dr. Tana Coast.  He needs to review plasma consent.  Continue abx as ordered.    See Dr. Josem Kaufmann note for additional details.   40 min critical care time with ahrf 2/2 COVID with new acute metabolic encephalopathy of unclear etiology.

## 2019-10-27 NOTE — Progress Notes (Addendum)
XC Note:  Patient signed out by Dr. Marcelline Deist, with episode of AMS/Lethargy.  Patient had evaluation initially with Glucose, VBG (still pending) but lower suspicion of hypercapnea, and CT Head which was negative for stroke.  Narcan had been given without improvement.  Noting PO meds at home, gave a dose of flumazenil which primary RN, [name] Lopes felt did improve his level of mentation.  Additionally, stat labs ordered, patient slightly hyponatremic and notably hypokalemic (did receive repletion in AM), but will follow-up lab.   However, subsequently notified of slurring speech (which patient reported to be chronic) and L sided weakness.  Upon my evaluation, patient still remained somewhat somnolent, it was noted that in report patient had received 8 mg of Ativan prior to transfer.  Per my exam, patient did not have sensory deficits in upper extremities, equal smile, speech was slightly slurred but also he is hypoxemic.  He was slightly weaker on the left vs right, but still able to resist bilaterally.  LLE he reports he always has altered sensorium  Code stroke called as onset of symptoms within the window, E-Consult and evaluation performed by Dr. Rory Percy.  After assessment, improving left sided deficits and otherwise non-focal exam, tPA was not recommended.   Plan: - Dr. Marcelline Deist had discontinued sedating meds, I have further discontinued Hydroxyzine and Trazodone prn - obtain additional labs TSH, B12, Ammonia - STAT CTA head and neck (basilar pathology) - Non emergent MRI  - gas is still pending   Appreciate consultation from Dr. Rory Percy, Neurology.  Truddie Hidden, MD Hospitalist  - interestingly, patient is retaining, all agents that may depress respiratory rate have been discontinued - patient would benefit from improved ventilation, he is also hypoxemic though oximetry sats have been at goal - other labs not as remarkable so this is likely to have been due to hypercapnea - d/w RT/ICU team,  felt somnolent for NIPPV, will give another dose of flumazenil due to reported response earlier, increase oxygen (also hypoxemic on ABG) and re-check ABG  After dose of Flumazenil patient again much more alert.  Renal function wnl.  Unclear if due to sustained effect of Ativan (which now they are unclear if it was a nursing report error).

## 2019-10-27 NOTE — Progress Notes (Signed)
Pt oriented to room/unit. VSS. Pt on 5L O2 via [place]. Pt is tachypneic/DOE. Pt requesting pain meds and a meal. Pt has generalized edema, 1+ in bilat LE. Pt has oversized calves firm calves. Pt reports no change prior to admission with calve size. Pt has generalized bruising and scrapes. Healed lesions on sacrum and penis. Condom cath placed. Pt came with no meds from [facility]. Pharmacy advised.Will continue to monitor.
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
## 2019-10-27 NOTE — Progress Notes (Signed)
Rt called Charge RN to verify if ABG was needed since VBG was resulted.  RN confirmed ABG unnecessary at this time.

## 2019-10-27 NOTE — ED Provider Notes (Signed)
[facility] DEPT Provider Note   CSN: [id number] Arrival date & time: 10/27/19  1023     History Chief Complaint  Patient presents with  . Shortness of Breath    [name] is a 34 y.o. male with a past medical history of controlled HIV on medications, bipolar disorder, schizophrenia, seizures currently on Topamax presenting to the ED with shortness of breath.  Reports 3-day history of shortness of breath. Negative COVID-19 test last month. Reports chills. Remainder of history is limited as patient is SOB.  HPI     Past Medical History:  Diagnosis Date  . Bipolar 1 disorder ([place])   . Depression   . Dizziness and giddiness 02/01/2016  . Herpes genitalia   . HIV disease ([place])   . Hypertension   . Migraine headache 02/01/2016  . Peripheral neuropathy 10/01/2019  . PTSD (post-traumatic stress disorder)   . Schizoaffective disorder ([place])   . Seizures [facility])     Patient Active Problem List   Diagnosis Date Noted  . Acute respiratory failure due to COVID-19 ([place]) 10/27/2019  . GERD (gastroesophageal reflux disease) 10/27/2019  . Hypokalemia 10/27/2019  . Peripheral neuropathy 10/01/2019  . PTSD (post-traumatic stress disorder) 07/23/2018  . Dizziness and giddiness 02/01/2016  . Migraine headache 02/01/2016  . Schizoaffective disorder, depressive type ([place]) 05/13/2015  . Severe alcohol dependence ([place]) 05/13/2015  . Suicidal ideation 01/12/2014    Past Surgical History:  Procedure Laterality Date  . BACK SURGERY    . HAND SURGERY         Family History  Problem Relation Age of Onset  . Alcohol abuse Mother   . Schizophrenia Father   . Depression Father   . Alcohol abuse Father   . Alcohol abuse Paternal Uncle   . Alcohol abuse Paternal Uncle     Social History   Tobacco Use  . Smoking status: Current Every Day Smoker    Packs/day: 1.00    Types: Cigarettes  . Smokeless tobacco: Never Used  Substance Use Topics  .  Alcohol use: Yes    Alcohol/week: 14.0 standard drinks    Types: 14 Cans of beer per week    Comment: 5-6 40oz per day  . Drug use: No    Home Medications Prior to Admission medications   Medication Sig Start Date End Date Taking? Authorizing Provider  albuterol (PROVENTIL HFA;VENTOLIN HFA) 108 (90 BASE) MCG/ACT inhaler Inhale 2 puffs into the lungs every 6 (six) hours as needed for wheezing or shortness of breath.    [provider]  alprazolam Duanne Moron) 2 MG tablet Take 2 mg by mouth 3 (three) times daily.    [provider]  bictegravir-emtricitabine-tenofovir AF (BIKTARVY) 50-200-25 MG TABS tablet Take 1 tablet by mouth daily.  02/24/17   [provider]  cetirizine (ZYRTEC) 10 MG tablet Take 1 tablet (10 mg total) by mouth daily. 05/15/15   Niel Hummer, NP  famotidine (PEPCID) 40 MG tablet Take 40 mg by mouth daily.    [provider]  FLUoxetine (PROZAC) 40 MG capsule Take 40 mg by mouth daily.    [provider]  hydrOXYzine (ATARAX/VISTARIL) 50 MG tablet Take 1 tablet (50 mg total) by mouth every 6 (six) hours as needed for anxiety. 07/25/18   Pennelope Bracken, MD  linaclotide (LINZESS) 145 MCG CAPS capsule Take 145 mcg by mouth daily as needed (constipation).    [provider]  lurasidone 120 MG TABS Take 1 tablet (  120 mg total) by mouth daily. 05/15/15   Niel Hummer, NP  thiamine (VITAMIN B-1) 100 MG tablet TAKE 1 TABLET ORALLY DAILY 01/31/19   [provider]  topiramate (TOPAMAX) 100 MG tablet Take 1 tablet (100 mg total) by mouth at bedtime. 07/01/19   Suzzanne Cloud, NP  traZODone (DESYREL) 300 MG tablet Take 1 tablet (300 mg total) by mouth at bedtime as needed for sleep. Patient taking differently: Take 300 mg by mouth at bedtime.  05/15/15   Niel Hummer, NP  valACYclovir (VALTREX) 1000 MG tablet Take 1 tablet (1,000 mg total) by mouth daily. 05/15/15   Niel Hummer, NP    Allergies    Dapsone and  Primaquine phosphate  Review of Systems   Review of Systems  Unable to perform ROS: Acuity of condition  Constitutional: Positive for fever.  Respiratory: Positive for shortness of breath.     Physical Exam Updated Vital Signs BP (!) 144/87 (BP Location: Right Arm)   Pulse (!) 115   Temp 100.3 F (37.9 C) (Oral)   Resp (!) 26   SpO2 (!) 88%   Physical Exam Vitals and nursing note reviewed.  Constitutional:      General: He is not in acute distress.    Appearance: He is well-developed.  HENT:     Head: Normocephalic and atraumatic.     Nose: Nose normal.  Eyes:     General: No scleral icterus.       Left eye: No discharge.     Conjunctiva/sclera: Conjunctivae normal.  Cardiovascular:     Rate and Rhythm: Regular rhythm. Tachycardia present.     Heart sounds: Normal heart sounds. No murmur. No friction rub. No gallop.   Pulmonary:     Effort: Tachypnea and accessory muscle usage present. No respiratory distress.     Breath sounds: Examination of the right-upper field reveals wheezing. Examination of the left-upper field reveals wheezing. Examination of the right-middle field reveals wheezing. Examination of the left-middle field reveals wheezing. Examination of the right-lower field reveals wheezing. Examination of the left-lower field reveals wheezing. Wheezing present.  Abdominal:     General: Bowel sounds are normal. There is no distension.     Palpations: Abdomen is soft.     Tenderness: There is no abdominal tenderness. There is no guarding.  Musculoskeletal:        General: Normal range of motion.     Cervical back: Normal range of motion and neck supple.  Skin:    General: Skin is warm and dry.     Findings: No rash.  Neurological:     Mental Status: He is alert.     Motor: No abnormal muscle tone.     Coordination: Coordination normal.     ED Results / Procedures / Treatments   Labs (all labs ordered are listed, but only abnormal results are  displayed) Labs Reviewed  LACTIC ACID, PLASMA - Abnormal; Notable for the following components:      Result Value   Lactic Acid, Venous 3.2 (*)    All other components within normal limits  CBC WITH DIFFERENTIAL/PLATELET - Abnormal; Notable for the following components:   RBC 3.22 (*)    Hemoglobin 10.9 (*)    HCT 33.9 (*)    MCV 105.3 (*)    Platelets 127 (*)    Abs Immature Granulocytes 0.35 (*)    All other components within normal limits  COMPREHENSIVE METABOLIC PANEL - Abnormal; Notable for the  following components:   Sodium 131 (*)    Potassium 2.9 (*)    Chloride 97 (*)    CO2 19 (*)    Calcium 8.6 (*)    Albumin 2.5 (*)    AST 206 (*)    ALT 58 (*)    Total Bilirubin 1.5 (*)    All other components within normal limits  D-DIMER, QUANTITATIVE (NOT AT [facility]) - Abnormal; Notable for the following components:   D-Dimer, Quant 1.45 (*)    All other components within normal limits  LACTATE DEHYDROGENASE - Abnormal; Notable for the following components:   LDH 458 (*)    All other components within normal limits  FERRITIN - Abnormal; Notable for the following components:   Ferritin 797 (*)    All other components within normal limits  FIBRINOGEN - Abnormal; Notable for the following components:   Fibrinogen 521 (*)    All other components within normal limits  C-REACTIVE PROTEIN - Abnormal; Notable for the following components:   CRP 13.0 (*)    All other components within normal limits  BLOOD GAS, VENOUS - Abnormal; Notable for the following components:   pCO2, Ven 42.5 (*)    pO2, Ven 125.0 (*)    All other components within normal limits  CULTURE, BLOOD (ROUTINE X 2)  CULTURE, BLOOD (ROUTINE X 2)  RESPIRATORY PANEL BY RT PCR (FLU A&B, COVID)  PROCALCITONIN  TRIGLYCERIDES  ETHANOL  LACTIC ACID, PLASMA  MAGNESIUM  MAGNESIUM  POC SARS CORONAVIRUS 2 AG -  ED    EKG EKG Interpretation  Date/Time:  Sunday October 27 2019 10:49:48 EST Ventricular Rate:  111 PR  Interval:    QRS Duration: 89 QT Interval:  384 QTC Calculation: 522 R Axis:   41 Text Interpretation: Sinus tachycardia Probable left atrial enlargement ST elev, probable normal early repol pattern Prolonged QT interval Confirmed by Veryl Speak [phone]) on 10/27/2019 11:23:30 AM   Radiology DG Chest Port 1 View  Result Date: 10/27/2019 CLINICAL DATA:  Shortness of breath. EXAM: PORTABLE CHEST 1 VIEW COMPARISON:  October 15, 2014. FINDINGS: Stable cardiomediastinal silhouette. No pneumothorax is noted. Multiple ill-defined airspace opacities are noted bilaterally consistent with multifocal pneumonia. Bony thorax is unremarkable. IMPRESSION: Bilateral multifocal pneumonia. Electronically Signed   By: Marijo Conception M.D.   On: 10/27/2019 12:04    Procedures .Critical Care Performed by: Delia Heady, PA-C Authorized by: Delia Heady, PA-C   Critical care provider statement:    Critical care time (minutes):  35   Critical care time was exclusive of:  Separately billable procedures and treating other patients   Critical care was necessary to treat or prevent imminent or life-threatening deterioration of the following conditions:  Cardiac failure, renal failure, respiratory failure, sepsis and circulatory failure   Critical care was time spent personally by me on the following activities:  Blood draw for specimens, discussions with consultants, evaluation of patient's response to treatment, examination of patient, review of old charts, re-evaluation of patient's condition, pulse oximetry, ordering and review of radiographic studies and ordering and review of laboratory studies   I assumed direction of critical care for this patient from another provider in my specialty: no     (including critical care time)  Medications Ordered in ED Medications  potassium chloride 10 mEq in 100 mL IVPB (has no administration in time range)  dexamethasone (DECADRON) injection 10 mg (has no administration in  time range)  potassium chloride SA (KLOR-CON) CR tablet 20 mEq (  has no administration in time range)  sodium chloride 0.9 % bolus 500 mL (0 mLs Intravenous Stopped 10/27/19 1219)  albuterol (VENTOLIN HFA) 108 (90 Base) MCG/ACT inhaler 4 puff (4 puffs Inhalation Given 10/27/19 1121)    ED Course  I have reviewed the triage vital signs and the nursing notes.  Pertinent labs & imaging results that were available during my care of the patient were reviewed by me and considered in my medical decision making (see chart for details).    MDM Rules/Calculators/A&P                      Garrison L Ashraf was evaluated in Emergency Department on 10/27/19  for the symptoms described in the history of present illness. He/she was evaluated in the context of the global COVID-19 pandemic, which necessitated consideration that the patient might be at risk for infection with the SARS-CoV-2 virus that causes COVID-19. Institutional protocols and algorithms that pertain to the evaluation of patients at risk for COVID-19 are in a state of rapid change based on information released by regulatory bodies including the CDC and federal and state organizations. These policies and algorithms were followed during the patient's care in the ED.  34 year old male with past medical history of HIV controlled on medications, alcohol abuse, presenting to ED for shortness of breath and fever. Patient tachypnec, tachycardic, febrile on arrival. He is maintaining his airway but diffuse wheezing noted on examination. He is able to answer my questions but will not give further information. Unknown sick contacts. Workup significant for negative POC COVID test but inflammatory markers all elevated. CXR findings c/w COVID pneumonia. He is oxygenating about 95% on his 5L [place]. I have ordered a repeat COVID swab, decadron and will admit to hospitalist service.  Final Clinical Impression(s) / ED Diagnoses Final diagnoses:  Suspected COVID-19  virus infection  Shortness of breath  Hypoxia    Rx / DC Orders ED Discharge Orders    None      Portions of this note were generated with Dragon dictation software. Dictation errors may occur despite best attempts at proofreading.    Delia Heady, PA-C 10/27/19 1230    Veryl Speak, MD 10/27/19 1443

## 2019-10-28 ENCOUNTER — Other Ambulatory Visit: Payer: Self-pay

## 2019-10-28 ENCOUNTER — Inpatient Hospital Stay (HOSPITAL_COMMUNITY)
Admit: 2019-10-28 | Discharge: 2019-10-28 | Disposition: A | Discharge status: Home / Self Care | Payer: Medicare HMO | Attending: Student in an Organized Health Care Education/Training Program | Admitting: Student in an Organized Health Care Education/Training Program

## 2019-10-28 DIAGNOSIS — R4182 Altered mental status, unspecified: Secondary | ICD-10-CM

## 2019-10-28 LAB — POCT I-STAT 7, (LYTES, BLD GAS, ICA,H+H)
Acid-base deficit: 2 mmol/L (ref 0.0–2.0)
Bicarbonate: 25.5 mmol/L (ref 20.0–28.0)
Bicarbonate: 26 mmol/L (ref 20.0–28.0)
Calcium, Ion: 1.32 mmol/L (ref 1.15–1.40)
Calcium, Ion: 1.29 mmol/L (ref 1.15–1.40)
HCT: 34 % — ABNORMAL LOW (ref 39.0–52.0)
HCT: 33 % — ABNORMAL LOW (ref 39.0–52.0)
Hemoglobin: 11.6 g/dL — ABNORMAL LOW (ref 13.0–17.0)
Hemoglobin: 11.2 g/dL — ABNORMAL LOW (ref 13.0–17.0)
O2 Saturation: 90 %
O2 Saturation: 95 %
Patient temperature: 98.6
Patient temperature: 98
Potassium: 4.1 mmol/L (ref 3.5–5.1)
Potassium: 4 mmol/L (ref 3.5–5.1)
Sodium: 137 mmol/L (ref 135–145)
Sodium: 137 mmol/L (ref 135–145)
TCO2: 27 mmol/L (ref 22–32)
TCO2: 27 mmol/L (ref 22–32)
pCO2 arterial: 57.9 mmHg — ABNORMAL HIGH (ref 32.0–48.0)
pCO2 arterial: 48.2 mmHg — ABNORMAL HIGH (ref 32.0–48.0)
pH, Arterial: 7.253 — ABNORMAL LOW (ref 7.350–7.450)
pH, Arterial: 7.339 — ABNORMAL LOW (ref 7.350–7.450)
pO2, Arterial: 69 mmHg — ABNORMAL LOW (ref 83.0–108.0)
pO2, Arterial: 81 mmHg — ABNORMAL LOW (ref 83.0–108.0)

## 2019-10-28 LAB — CBC WITH DIFFERENTIAL/PLATELET
Abs Immature Granulocytes: 0.5 10*3/uL — ABNORMAL HIGH (ref 0.00–0.07)
Basophils Absolute: 0 10*3/uL (ref 0.0–0.1)
Basophils Relative: 1 %
Eosinophils Absolute: 0 10*3/uL (ref 0.0–0.5)
Eosinophils Relative: 0 %
HCT: 35.4 % — ABNORMAL LOW (ref 39.0–52.0)
Hemoglobin: 11.4 g/dL — ABNORMAL LOW (ref 13.0–17.0)
Immature Granulocytes: 6 %
Lymphocytes Relative: 11 %
Lymphs Abs: 1 10*3/uL (ref 0.7–4.0)
MCH: 35.1 pg — ABNORMAL HIGH (ref 26.0–34.0)
MCHC: 32.2 g/dL (ref 30.0–36.0)
MCV: 108.9 fL — ABNORMAL HIGH (ref 80.0–100.0)
Monocytes Absolute: 0.4 10*3/uL (ref 0.1–1.0)
Monocytes Relative: 5 %
Neutro Abs: 6.9 10*3/uL (ref 1.7–7.7)
Neutrophils Relative %: 77 %
Platelets: 156 10*3/uL (ref 150–400)
RBC: 3.25 MIL/uL — ABNORMAL LOW (ref 4.22–5.81)
RDW: 15.2 % (ref 11.5–15.5)
WBC: 8.8 10*3/uL (ref 4.0–10.5)
nRBC: 0.2 % (ref 0.0–0.2)

## 2019-10-28 LAB — COMPREHENSIVE METABOLIC PANEL
ALT: 69 U/L — ABNORMAL HIGH (ref 0–44)
AST: 200 U/L — ABNORMAL HIGH (ref 15–41)
Albumin: 3 g/dL — ABNORMAL LOW (ref 3.5–5.0)
Alkaline Phosphatase: 76 U/L (ref 38–126)
Anion gap: 15 (ref 5–15)
BUN: 17 mg/dL (ref 6–20)
CO2: 24 mmol/L (ref 22–32)
Calcium: 9.3 mg/dL (ref 8.9–10.3)
Chloride: 97 mmol/L — ABNORMAL LOW (ref 98–111)
Creatinine, Ser: 1.04 mg/dL (ref 0.61–1.24)
GFR calc Af Amer: >60 mL/min (ref >60)
GFR calc non Af Amer: >60 mL/min (ref >60)
Glucose, Bld: 138 mg/dL — ABNORMAL HIGH (ref 70–99)
Potassium: 4.2 mmol/L (ref 3.5–5.1)
Sodium: 136 mmol/L (ref 135–145)
Total Bilirubin: 1.4 mg/dL — ABNORMAL HIGH (ref 0.3–1.2)
Total Protein: 8.3 g/dL — ABNORMAL HIGH (ref 6.5–8.1)

## 2019-10-28 LAB — C-REACTIVE PROTEIN: CRP: 11.3 mg/dL — ABNORMAL HIGH (ref <1.0)

## 2019-10-28 LAB — ABO/RH: ABO/RH(D): O POS

## 2019-10-28 LAB — TYPE AND SCREEN
ABO/RH(D): O POS
Antibody Screen: NEGATIVE

## 2019-10-28 LAB — TSH: TSH: 0.309 u[IU]/mL — ABNORMAL LOW (ref 0.350–4.500)

## 2019-10-28 LAB — D-DIMER, QUANTITATIVE: D-Dimer, Quant: 1.17 ug/mL-FEU — ABNORMAL HIGH (ref 0.00–0.50)

## 2019-10-28 LAB — FOLATE: Folate: 21.3 ng/mL (ref >5.9)

## 2019-10-28 LAB — VITAMIN B12: Vitamin B-12: 390 pg/mL (ref 180–914)

## 2019-10-28 LAB — PHOSPHORUS: Phosphorus: 4.9 mg/dL — ABNORMAL HIGH (ref 2.5–4.6)

## 2019-10-28 LAB — AMMONIA: Ammonia: 17 umol/L (ref 9–35)

## 2019-10-28 LAB — FERRITIN: Ferritin: 919 ng/mL — ABNORMAL HIGH (ref 24–336)

## 2019-10-28 LAB — MAGNESIUM: Magnesium: 2.4 mg/dL (ref 1.7–2.4)

## 2019-10-28 MED ORDER — INSULIN ASPART 100 UNIT/ML ~~LOC~~ SOLN: 0.0000 [IU] | Freq: Three times a day (TID) | SUBCUTANEOUS | Status: DC

## 2019-10-28 MED ORDER — FLUMAZENIL 0.5 MG/5ML IV SOLN: 0.2000 mg | Freq: Once | INTRAVENOUS | Status: DC

## 2019-10-28 MED ORDER — INSULIN ASPART 100 UNIT/ML ~~LOC~~ SOLN: 0.0000 [IU] | Freq: Every day | SUBCUTANEOUS | Status: DC

## 2019-10-28 MED ORDER — FLUMAZENIL 0.5 MG/5ML IV SOLN
0.2000 mg | Freq: Once | INTRAVENOUS | Status: AC
  Administered 2019-10-28: 0.2 mg via INTRAVENOUS
  Filled 2019-10-28: qty 5

## 2019-10-28 MED ORDER — TOPIRAMATE 25 MG PO TABS
75.0000 mg | ORAL_TABLET | Freq: Every day | ORAL | Status: DC
  Administered 2019-10-28 – 2019-11-01 (×5): 75 mg via ORAL
  Filled 2019-10-28 (×6): qty 3

## 2019-10-28 NOTE — Procedures (Signed)
Patient Name: [name]  MRN: [id number]  Epilepsy Attending: Lora Havens  Referring Physician/Provider: Dr Amie Portland Date: 10/28/2019 Duration: 22.26 mins  Patient history: 34 year old male with history of HIV, now admitted with COVID-19 infection with sudden onset of altered mental status and possible left-sided weakness.  CTA did not show any acute infarct.  EEG to evaluate for seizures.  Level of alertness: Awake, sleep  AEDs during EEG study: Topiramate  Technical aspects: This EEG study was done with scalp electrodes positioned according to the 10-20 International system of electrode placement. Electrical activity was acquired at a sampling rate of 500Hz  and reviewed with a high frequency filter of 70Hz  and a low frequency filter of 1Hz . EEG data were recorded continuously and digitally stored.   Description: During awake state, no clear posterior dominant rhythm was seen.  Sleep was characterized by vertex waves, sleep spindles (12 to 14 Hz), maximal frontocentral.  EEG also showed mixed 10 to 15 Hz alpha-beta frequencies distributed symmetrically and diffusely.  Hyperventilation and photic stimulation were not performed.  IMPRESSION: This study is within normal limits. No seizures or epileptiform discharges were seen throughout the recording.  Loren Sawaya Barbra Sarks

## 2019-10-28 NOTE — Progress Notes (Signed)
Pt awake and alert. Sitting up in bed and eating dinner. Advised pt of need for urine sample and condom cath removed momentarily and pt now says unable to urinate. Will check again later. VSS. Pt on 4L O2 and sats 95-96%. No distress noted.

## 2019-10-28 NOTE — Progress Notes (Addendum)
PHARMACY - PHYSICIAN COMMUNICATION CRITICAL VALUE ALERT - BLOOD CULTURE IDENTIFICATION (BCID)  Luiscarlos L Gillian is an 34 y.o. male who presented to [facility] on 10/27/2019 with a chief complaint of COVID PNA.  Assessment: 1/2 aerobic GPC with clusters. WBC wnl. Afebrile.  On Azithro/CTX for CAP. No change in abx is recommended.   Name of physician (or Provider) Contacted: Dr. Florene Glen  Current antibiotics: Azithro/CTX  Changes to prescribed antibiotics recommended: none   Benetta Spar, PharmD, BCPS, [facility] Clinical Pharmacist  Please check AMION for all [place] phone numbers After 10:00 PM, call [place] [phone]

## 2019-10-28 NOTE — Plan of Care (Signed)
Pt obtunded at shift change. Last known normal approx 1830. CBG 120, Narcan, Romazicon and IV Keppra given with little to no effect. Code stroke called. Pt noted to be weaker on left side. Head/neck CT done. Plan for pt to have MRI. To remain NPO until done. Will make oncoming staff aware.

## 2019-10-28 NOTE — Progress Notes (Signed)
Rt called multiple times about VBG order and RT explained respiratory does not draw or run VBG's.  MD cancelled ABG that was originally ordered but d/t lab not obtaining VBG, charge RN, Georgeanna Harrison, requested this RT obtain an ABG so there are gas results in the chart for the MD.    ABG result- 7.25/57.9/69/25.5 on 3LPM [place].  Pt did not react to stick, nor did he wake up to speak to RT while present.  At this time, pt is snoring and is having periods of apnea.  Vital signs are normal but pt does not match the monitor.  Pt is pale and nearly unresponsive.  Protection of airway may become an issue at some point today.  RT will monitor.

## 2019-10-28 NOTE — Progress Notes (Signed)
EEG complete - results pending 

## 2019-10-28 NOTE — Plan of Care (Signed)
  Problem: Education: Goal: Knowledge of General Education information will improve Description: Including pain rating scale, medication(s)/side effects and non-pharmacologic comfort measures Outcome: Progressing   Problem: Health Behavior/Discharge Planning: Goal: Ability to manage health-related needs will improve Outcome: Progressing   Problem: Clinical Measurements: Goal: Ability to maintain clinical measurements within normal limits will improve Outcome: Progressing Goal: Will remain free from infection Outcome: Progressing Goal: Diagnostic test results will improve Outcome: Progressing Goal: Respiratory complications will improve Outcome: Progressing Goal: Cardiovascular complication will be avoided Outcome: Progressing   Problem: Activity: Goal: Risk for activity intolerance will decrease Outcome: Progressing   Problem: Nutrition: Goal: Adequate nutrition will be maintained Outcome: Progressing   Problem: Coping: Goal: Level of anxiety will decrease Outcome: Progressing   Problem: Elimination: Goal: Will not experience complications related to bowel motility Outcome: Progressing Goal: Will not experience complications related to urinary retention Outcome: Progressing   Problem: Pain Managment: Goal: General experience of comfort will improve Outcome: Progressing   Problem: Safety: Goal: Ability to remain free from injury will improve Outcome: Progressing   Problem: Skin Integrity: Goal: Risk for impaired skin integrity will decrease Outcome: Progressing   Problem: Activity: Goal: Ability to tolerate increased activity will improve Outcome: Progressing   Problem: Respiratory: Goal: Ability to maintain a clear airway will improve Outcome: Progressing Goal: Levels of oxygenation will improve Outcome: Progressing   Problem: Education: Goal: Knowledge of risk factors and measures for prevention of condition will improve Outcome: Progressing    Problem: Coping: Goal: Psychosocial and spiritual needs will be supported Outcome: Progressing   Problem: Respiratory: Goal: Will maintain a patent airway Outcome: Progressing Goal: Complications related to the disease process, condition or treatment will be avoided or minimized Outcome: Progressing   Problem: Education: Goal: Understanding of discharge needs will improve Outcome: Progressing   Problem: Health Behavior/Discharge Planning: Goal: Ability to identify changes in lifestyle to reduce recurrence of condition will improve Outcome: Progressing   Problem: Physical Regulation: Goal: Complications related to the disease process, condition or treatment will be avoided or minimized Outcome: Progressing

## 2019-10-28 NOTE — Plan of Care (Signed)
Following up on results: NH3 normal at 17 Lactate 2.3 TSH -p  ABG    Component Value Date/Time   PHART 7.253 (L) 10/28/2019 0550   PCO2ART 57.9 (H) 10/28/2019 0550   PO2ART 69.0 (L) 10/28/2019 0550   HCO3 25.5 10/28/2019 0550   TCO2 27 10/28/2019 0550   ACIDBASEDEF 2.0 10/28/2019 0550   O2SAT 90.0 10/28/2019 0550  Acidotic, hypoixic and hypercarbic. Likley the cause of the AMS  Additional Recs:  Management per primary team. Dr. Andria Frames aware.  -- Amie Portland, MD [facility]

## 2019-10-28 NOTE — Progress Notes (Signed)
Pt more alert. Pt gave consent for Plasma infusion. Pt is asking questions about meds given and symptoms. VSS. Will continue to monitor.

## 2019-10-28 NOTE — Progress Notes (Signed)
PROGRESS NOTE    PARNELL SPIELER  QDI:[id number] DOB: Aug 13, 1986 DOA: 10/27/2019 PCP: [name], MD   Brief Narrative:  Patient is [name] 34 year old male with history of controlled HIV on medications, bipolar disorder, schizophrenia, seizures presented to ED with worsening shortness of breath and wheezing for the last 3 days.  Patient is diffusely wheezing at the time of my examination, hypoxic on 5 L O2.  Patient reported that he had fever and chills 101 F, nausea, coughing, shortness of breath, progressively worsening in the last 3 days.  States he lives alone, denies any known exposure to Covid.  Had Shallon Yaklin negative Covid test last month. Poor appetite, denied any diarrhea.  History is limited as patient was short of breath.  Patient also reports that he smokes half Hali Balgobin pack Dontarius Sheley day.  Assessment & Plan:   Principal Problem:   Acute respiratory failure due to COVID-19 [facility]) Active Problems:   Schizoaffective disorder, depressive type (HCC)   GERD (gastroesophageal reflux disease)   Hypokalemia  Acute hypoxic respiratory failure secondary to COVID 19 Pneumonia  Possible Community Acquired Pneumonia - Currently requiring 5 L [place] - Continue steroids, remdesivir - received actemra 1/17 - Plasma has been ordered, not yet given - on abx for possible CAP - I/O, daily weights - IS, OOB, prone as able  COVID-19 Labs  Recent Labs    10/27/19 1102 10/28/19 0321  DDIMER 1.45* 1.17*  FERRITIN 797* 919*  LDH 458*  --   CRP 13.0* 11.3*    Lab Results  Component Value Date   SARSCOV2NAA POSITIVE (Lillyan Hitson) 40/76/8088   Acute Metabolic Encephalopathy Secondary to Hypercarbic Respiratory Failure Suspect 2/2 Benzodiazepines: Somnolent last night, responded to flumazenil He's back to normal today Given hypercarbia, response to flumazenil, suspect this was 2/2 benzodiazepine No documented benzos given, he takes this at home - doesn't appear he has any medications here, don't think he took any  here Seen by neurology last night -> had CT head which was negative, CTA head/neck negative for large vessel occlusion EEG normal.  B12 wnl, folate wnl, TSH slightly low (follow free T4), ammonia wnl.  ABG normalizing. MRI was ordered, but given likely related to benzo/hypercarbia, I think this can be deferred  Gram Positive Cocci in Clusters noted in 1 aerobic bottle: noted in 1 aerobic bottle only.  Follow repeat cx.  Continue to monitor.  Nicotine use -Patient reports half pack per day smoking, counseled on smoking cessation.  May have underlying COPD -Placed on scheduled nebs, continue IV steroids, nicotine patch    Schizoaffective disorder, depressive type (HCC) -Continue fluoxetine, lurasidone, trazodone as needed - xanax is on hold -Hold Haldol due to prolonged QTC  History of HIV -Continue Biktarvy    GERD (gastroesophageal reflux disease) -Continue Pepcid    Hypokalemia -Obtain magnesium level, placed on potassium replacement  Alcohol use - d/c CIWA and prn ativan given lethargy above - will need to resume xanax when more awake  Abnormal Liver Enzymes:  - likely 2/2 COVID vs etoh [place] - follow acute hepatitis panel  History of seizures? (migraines?) -Continue topiramate - he denies sz and tells me he takes this for migraines  Hx Muscular Dystrophy: follow with neuro outpatient  DVT prophylaxis: lovenox Code Status: full  Family Communication: none at bedside Disposition Plan: pending further improvement  Consultants:   neurology  Procedures:  EEG IMPRESSION: This study is within normal limits. No seizures or epileptiform discharges were seen throughout the recording.  Antimicrobials:  Anti-infectives (  From admission, onward)   Start     Dose/Rate Route Frequency Ordered Stop   10/28/19 1000  remdesivir 100 mg in sodium chloride 0.9 % 100 mL IVPB     100 mg 200 mL/hr over 30 Minutes Intravenous Daily 10/27/19 1551 11/01/19 0959   10/27/19 2000   bictegravir-emtricitabine-tenofovir AF (BIKTARVY) 50-200-25 MG per tablet 1 tablet     1 tablet Oral Daily 10/27/19 1815     10/27/19 2000  valACYclovir (VALTREX) tablet 1,000 mg     1,000 mg Oral Daily 10/27/19 1815     10/27/19 1800  remdesivir 200 mg in sodium chloride 0.9% 250 mL IVPB     200 mg 580 mL/hr over 30 Minutes Intravenous Once 10/27/19 1551 10/28/19 0100   10/27/19 1400  azithromycin (ZITHROMAX) 500 mg in sodium chloride 0.9 % 250 mL IVPB     500 mg 250 mL/hr over 60 Minutes Intravenous Every 24 hours 10/27/19 1310     10/27/19 1330  cefTRIAXone (ROCEPHIN) 1 g in sodium chloride 0.9 % 100 mL IVPB     1 g 200 mL/hr over 30 Minutes Intravenous Every 24 hours 10/27/19 1310       Subjective: Feels better, about 80% Denies numbness, tingling, weakness out of normal Thinks he was given something in ED, but not sure  Objective: Vitals:   10/28/19 1118 10/28/19 1119 10/28/19 1120 10/28/19 1121  BP:    118/72  Pulse: 84 80 83 81  Resp: 12 12 12 12   Temp:    (!) 97 F (36.1 C)  TempSrc:    Axillary  SpO2: 97% 97% 96% 97%  Weight:      Height:        Intake/Output Summary (Last 24 hours) at 10/28/2019 1421 Last data filed at 10/28/2019 0600 Gross per 24 hour  Intake 100 ml  Output 1800 ml  Net -1700 ml   Filed Weights   10/27/19 1234  Weight: (!) 137.4 kg    Examination:  General exam: Appears calm and comfortable  Respiratory system: Clear to auscultation. Respiratory effort normal. Cardiovascular system: S1 & S2 heard, RRR.  Gastrointestinal system: Abdomen is nondistended, soft and nontender. Central nervous system: Alert and oriented. No focal neurological deficits.  CN 2-12 intact. Extremities: Symmetric 5 x 5 power. Skin: No rashes, lesions or ulcers Psychiatry: Judgement and insight appear normal. Mood & affect appropriate.     Data Reviewed: I have personally reviewed following labs and imaging studies  CBC: Recent Labs  Lab 10/27/19 1102  10/28/19 0321 10/28/19 0550 10/28/19 0916  WBC 8.8 8.8  --   --   NEUTROABS 6.5 6.9  --   --   HGB 10.9* 11.4* 11.6* 11.2*  HCT 33.9* 35.4* 34.0* 33.0*  MCV 105.3* 108.9*  --   --   PLT 127* 156  --   --    Basic Metabolic Panel: Recent Labs  Lab 10/27/19 1102 10/27/19 1232 10/27/19 2055 10/28/19 0321 10/28/19 0550 10/28/19 0916  NA 131*  --  134* 136 137 137  K 2.9*  --  4.0 4.2 4.1 4.0  CL 97*  --  96* 97*  --   --   CO2 19*  --  23 24  --   --   GLUCOSE 75  --  122* 138*  --   --   BUN 14  --  16 17  --   --   CREATININE 1.12  --  1.15 1.04  --   --  CALCIUM 8.6*  --  9.3 9.3  --   --   MG  --  2.4  --  2.4  --   --   PHOS  --   --   --  4.9*  --   --    GFR: Estimated Creatinine Clearance: 148.2 mL/min (by C-G formula based on SCr of 1.04 mg/dL). Liver Function Tests: Recent Labs  Lab 10/27/19 1102 10/27/19 2055 10/28/19 0321  AST 206* 223* 200*  ALT 58* 67* 69*  ALKPHOS 69 81 76  BILITOT 1.5* 1.4* 1.4*  PROT 6.8 7.8 8.3*  ALBUMIN 2.5* 2.7* 3.0*   No results for input(s): LIPASE, AMYLASE in the last 168 hours. Recent Labs  Lab 10/28/19 0442  AMMONIA 17   Coagulation Profile: No results for input(s): INR, PROTIME in the last 168 hours. Cardiac Enzymes: No results for input(s): CKTOTAL, CKMB, CKMBINDEX, TROPONINI in the last 168 hours. BNP (last 3 results) No results for input(s): PROBNP in the last 8760 hours. HbA1C: No results for input(s): HGBA1C in the last 72 hours. CBG: Recent Labs  Lab 10/27/19 1753 10/27/19 1937  GLUCAP 96 120*   Lipid Profile: Recent Labs    10/27/19 1102  TRIG 94   Thyroid Function Tests: Recent Labs    10/28/19 0321  TSH 0.309*   Anemia Panel: Recent Labs    10/27/19 1102 10/28/19 0321  VITAMINB12  --  390  FOLATE  --  21.3  FERRITIN 797* 919*   Sepsis Labs: Recent Labs  Lab 10/27/19 1102 10/27/19 1232  PROCALCITON 0.72  --   LATICACIDVEN 3.2* 2.3*    Recent Results (from the past 240  hour(s))  Blood Culture (routine x 2)     Status: None (Preliminary result)   Collection Time: 10/27/19 11:02 AM   Specimen: BLOOD RIGHT HAND  Result Value Ref Range Status   Specimen Description   Final    BLOOD RIGHT HAND Performed at [facility], [place] [address], [place], [place] [address]    Special Requests   Final    BOTTLES DRAWN AEROBIC AND ANAEROBIC Blood Culture results may not be optimal due to an inadequate volume of blood received in culture bottles Performed at [place] [address], [place], [place] [address]    Culture   Final    NO GROWTH < 24 HOURS Performed at [place] [address] [place], [place] [address]    Report Status PENDING  Incomplete  Blood Culture (routine x 2)     Status: None (Preliminary result)   Collection Time: 10/27/19 11:02 AM   Specimen: BLOOD  Result Value Ref Range Status   Specimen Description   Final    BLOOD LEFT ANTECUBITAL Performed at [place] [address], [place], [place] [address]    Special Requests   Final    BOTTLES DRAWN AEROBIC AND ANAEROBIC Blood Culture adequate volume Performed at [place] [address] [place], [place] [address]    Culture  Setup Time   Final    GRAM POSITIVE COCCI IN CLUSTERS AEROBIC BOTTLE ONLY CRITICAL RESULT CALLED TO, READ BACK BY AND VERIFIED WITH: L. CHEN, PHARMD ([place])  AT 6073 ON 10/28/19 BY C. JESSUP, MT.    Culture   Final    CULTURE REINCUBATED FOR BETTER GROWTH Performed at [facility], [place] [address] [place], [place] [address]    Report Status PENDING  Incomplete  Respiratory Panel by RT PCR (Flu Emberlee Sortino&B, Covid) - Nasopharyngeal Swab     Status: Abnormal   Collection Time: 10/27/19 12:32 PM   Specimen: Nasopharyngeal Swab  Result Value Ref Range Status   SARS Coronavirus 2 by RT PCR POSITIVE (Didier Brandenburg) NEGATIVE Final    Comment: RESULT CALLED TO, READ BACK BY AND VERIFIED  WITH: Q.MILNER,RN [id number] @1455  BY V.WILKINS (NOTE) SARS-CoV-2 target nucleic acids are DETECTED. SARS-CoV-2 RNA is generally detectable in upper respiratory specimens  during the acute phase of infection. Positive results are indicative of the presence of the identified virus, but do not rule out bacterial infection or co-infection with other pathogens not detected by the test. Clinical correlation with patient history and other diagnostic information is necessary to determine patient infection status. The expected result is Negative. Fact Sheet for Patients:  PinkCheek.be Fact Sheet for Healthcare Providers: GravelBags.it This test is not yet approved or cleared by the [place] FDA and  has been authorized for detection and/or diagnosis of SARS-CoV-2 by FDA under an Emergency Use Authorization (EUA).  This EUA will remain in effect (meaning this test can be used)  for the duration of  the COVID-19 declaration under Section 564(b)(1) of the Act, 21 U.S.C. section 360bbb-3(b)(1), unless the authorization is terminated or revoked sooner.    Influenza [place] by PCR NEGATIVE NEGATIVE Final   Influenza B by PCR NEGATIVE NEGATIVE Final    Comment: (NOTE) The Xpert Xpress SARS-CoV-2/FLU/RSV assay is intended as an aid in  the diagnosis of influenza from Nasopharyngeal swab specimens and  should not be used as Carlon Davidson sole basis for treatment. Nasal washings and  aspirates are unacceptable for Xpert Xpress SARS-CoV-2/FLU/RSV  testing. Fact Sheet for Patients: PinkCheek.be Fact Sheet for Healthcare Providers: GravelBags.it This test is not yet approved or cleared by the [place] FDA and  has been authorized for detection and/or diagnosis of SARS-CoV-2 by  FDA under an Emergency Use Authorization (EUA). This EUA will remain  in effect (meaning this test can be used) for the  duration of the  Covid-19 declaration under Section 564(b)(1) of the Act, 21  U.S.C. section 360bbb-3(b)(1), unless the authorization is  terminated or revoked. Performed at [facility], [place] [address], [place], [place] [address]          Radiology Studies: EEG  Result Date: 10/28/2019 Lora Havens, MD     10/28/2019  9:57 AM Patient Name: KERIN CECCHI MRN: [id number] Epilepsy Attending: Lora Havens Referring Physician/Provider: Dr Amie [place] Date: 10/28/2019 Duration: 22.26 mins Patient history: 34 year old male with history of HIV, now admitted with COVID-19 infection with sudden onset of altered mental status and possible left-sided weakness.  CTA did not show any acute infarct.  EEG to evaluate for seizures. Level of alertness: Awake, sleep AEDs during EEG study: Topiramate Technical aspects: This EEG study was done with scalp electrodes positioned according to the 10-20 International system of electrode placement. Electrical activity was acquired at Avel Ogawa sampling rate of 500Hz  and reviewed with Jaedin Trumbo high frequency filter of 70Hz  and Gio Janoski low frequency filter of 1Hz . EEG data were recorded continuously and digitally stored. Description: During awake state, no clear posterior dominant rhythm was seen.  Sleep was characterized by vertex waves, sleep spindles (12 to 14 Hz), maximal frontocentral.  EEG also showed mixed 10 to 15 Hz alpha-beta frequencies distributed symmetrically and diffusely.  Hyperventilation and photic stimulation were not performed. IMPRESSION: This study is within normal limits. No seizures or  epileptiform discharges were seen throughout the recording. Lora Havens   CT Code Stroke CTA Head W/WO contrast  Result Date: 10/28/2019 CLINICAL DATA:  Follow-up examination for acute stroke. EXAM: CT ANGIOGRAPHY HEAD AND NECK TECHNIQUE: Multidetector CT imaging of the head and neck was performed using the standard protocol during bolus administration of  intravenous contrast. Multiplanar CT image reconstructions and MIPs were obtained to evaluate the vascular anatomy. Carotid stenosis measurements (when applicable) are obtained utilizing NASCET criteria, using the distal internal carotid diameter as the denominator. CONTRAST:  137m OMNIPAQUE IOHEXOL 350 MG/ML SOLN COMPARISON:  Prior head CT from earlier same day. FINDINGS: CTA NECK FINDINGS Aortic arch: Examination technically limited by motion artifact. Visualized aortic arch of normal caliber with normal branch pattern. No hemodynamically significant stenosis seen about the origin of the great vessels. Visualized subclavian arteries widely patent. Right carotid system: Right common and internal carotid arteries grossly patent without stenosis, dissection, or occlusion. Left carotid system: Left common and internal carotid arteries grossly patent without stenosis, dissection or occlusion. Vertebral arteries: Both vertebral arteries arise from the subclavian arteries. Right vertebral artery dominant. Vertebral arteries patent within the neck without stenosis, dissection or occlusion. Skeleton: No acute osseous abnormality. No discrete osseous lesions. Degenerative spondylosis noted at C5-6 without significant stenosis. Other neck: No other acute soft tissue abnormality within the neck. Upper chest: Extensive multifocal airspace opacities noted within the visualized lungs, likely related history of COVID infection. Visualized upper chest demonstrates no other acute finding. Review of the MIP images confirms the above findings CTA HEAD FINDINGS Anterior circulation: Internal carotid arteries patent to the termini without stenosis or other abnormality. A1 segments patent bilaterally. Normal anterior communicating artery. Anterior cerebral arteries grossly patent to their distal aspects without stenosis. No M1 stenosis or occlusion. Negative MCA bifurcations. Distal MCA branches grossly perfused and symmetric.  Posterior circulation: Vertebral arteries patent to the vertebrobasilar junction without stenosis. Posteroinferior cerebral arteries patent bilaterally. Basilar patent to its distal aspect without appreciable stenosis. Superior cerebral arteries patent bilaterally. Right PCA primarily supplied via the basilar. Left PCA supplied via Saidy Ormand hypoplastic left P1 segment as well as Ritaj Dullea robust left posterior communicating artery. Both PCAs widely patent to their distal aspects. Venous sinuses: Patent. Anatomic variants: None significant. Review of the MIP images confirms the above findings IMPRESSION: 1. Negative CTA for large vessel occlusion. No significant atheromatous change about the major arterial vasculature of the head and neck. No hemodynamically significant stenosis or other acute abnormality. 2. Extensive airspace disease throughout the visualized lungs, consistent with history of COVID pneumonia. Results were discussed by telephone on 10/28/2019 at 10:30 p.m. with providerASHISH ARORA. Electronically Signed   By: BJeannine BogaM.D.   On: 10/28/2019 00:40   CT Code Stroke CTA Neck W/WO contrast  Result Date: 10/28/2019 CLINICAL DATA:  Follow-up examination for acute stroke. EXAM: CT ANGIOGRAPHY HEAD AND NECK TECHNIQUE: Multidetector CT imaging of the head and neck was performed using the standard protocol during bolus administration of intravenous contrast. Multiplanar CT image reconstructions and MIPs were obtained to evaluate the vascular anatomy. Carotid stenosis measurements (when applicable) are obtained utilizing NASCET criteria, using the distal internal carotid diameter as the denominator. CONTRAST:  1056mOMNIPAQUE IOHEXOL 350 MG/ML SOLN COMPARISON:  Prior head CT from earlier same day. FINDINGS: CTA NECK FINDINGS Aortic arch: Examination technically limited by motion artifact. Visualized aortic arch of normal caliber with normal branch pattern. No hemodynamically significant stenosis seen about the  origin of the great vessels.  Visualized subclavian arteries widely patent. Right carotid system: Right common and internal carotid arteries grossly patent without stenosis, dissection, or occlusion. Left carotid system: Left common and internal carotid arteries grossly patent without stenosis, dissection or occlusion. Vertebral arteries: Both vertebral arteries arise from the subclavian arteries. Right vertebral artery dominant. Vertebral arteries patent within the neck without stenosis, dissection or occlusion. Skeleton: No acute osseous abnormality. No discrete osseous lesions. Degenerative spondylosis noted at C5-6 without significant stenosis. Other neck: No other acute soft tissue abnormality within the neck. Upper chest: Extensive multifocal airspace opacities noted within the visualized lungs, likely related history of COVID infection. Visualized upper chest demonstrates no other acute finding. Review of the MIP images confirms the above findings CTA HEAD FINDINGS Anterior circulation: Internal carotid arteries patent to the termini without stenosis or other abnormality. A1 segments patent bilaterally. Normal anterior communicating artery. Anterior cerebral arteries grossly patent to their distal aspects without stenosis. No M1 stenosis or occlusion. Negative MCA bifurcations. Distal MCA branches grossly perfused and symmetric. Posterior circulation: Vertebral arteries patent to the vertebrobasilar junction without stenosis. Posteroinferior cerebral arteries patent bilaterally. Basilar patent to its distal aspect without appreciable stenosis. Superior cerebral arteries patent bilaterally. Right PCA primarily supplied via the basilar. Left PCA supplied via Nyair Depaulo hypoplastic left P1 segment as well as Cris Talavera robust left posterior communicating artery. Both PCAs widely patent to their distal aspects. Venous sinuses: Patent. Anatomic variants: None significant. Review of the MIP images confirms the above findings  IMPRESSION: 1. Negative CTA for large vessel occlusion. No significant atheromatous change about the major arterial vasculature of the head and neck. No hemodynamically significant stenosis or other acute abnormality. 2. Extensive airspace disease throughout the visualized lungs, consistent with history of COVID pneumonia. Results were discussed by telephone on 10/28/2019 at 10:30 p.m. with providerASHISH ARORA. Electronically Signed   By: Jeannine Boga M.D.   On: 10/28/2019 00:40   DG Chest Port 1 View  Result Date: 10/27/2019 CLINICAL DATA:  Shortness of breath. EXAM: PORTABLE CHEST 1 VIEW COMPARISON:  October 15, 2014. FINDINGS: Stable cardiomediastinal silhouette. No pneumothorax is noted. Multiple ill-defined airspace opacities are noted bilaterally consistent with multifocal pneumonia. Bony thorax is unremarkable. IMPRESSION: Bilateral multifocal pneumonia. Electronically Signed   By: Marijo Conception M.D.   On: 10/27/2019 12:04   CT HEAD CODE STROKE WO CONTRAST  Result Date: 10/27/2019 CLINICAL DATA:  Code stroke. Encephalopathy. Coronavirus infection. EXAM: CT HEAD WITHOUT CONTRAST TECHNIQUE: Contiguous axial images were obtained from the base of the skull through the vertex without intravenous contrast. COMPARISON:  MRI 02/18/2016 FINDINGS: Brain: No sign of acute infarction, mass lesion, hemorrhage, hydrocephalus or extra-axial collection. Physiologic calcification of the basal ganglia. Vascular: No abnormal vascular finding. Skull: Normal Sinuses/Orbits: Clear/normal Other: None ASPECTS ([place] Stroke Program Early CT Score) - Ganglionic level infarction (caudate, lentiform nuclei, internal capsule, insula, M1-M3 cortex): 7 - Supraganglionic infarction (M4-M6 cortex): 3 Total score (0-10 with 10 being normal): 10 IMPRESSION: 1. No acute finding.  Negative head CT. 2. ASPECTS is 10 3. Call report in progress. Electronically Signed   By: Nelson Chimes M.D.   On: 10/27/2019 20:43         Scheduled Meds: . albuterol  2 puff Inhalation Q4H  . bictegravir-emtricitabine-tenofovir AF  1 tablet Oral Daily  . budesonide  2 puff Inhalation BID  . enoxaparin (LOVENOX) injection  70 mg Subcutaneous Q24H  . famotidine  40 mg Oral Daily  . FLUoxetine  40 mg Oral Daily  .  folic acid  1 mg Oral Daily  . ipratropium  2 puff Inhalation Q6H  . loratadine  10 mg Oral Daily  . lurasidone  120 mg Oral Daily  . methylPREDNISolone (SOLU-MEDROL) injection  60 mg Intravenous Q12H  . multivitamin with minerals  1 tablet Oral Daily  . nicotine  14 mg Transdermal Daily  . thiamine  100 mg Oral Daily   Or  . thiamine  100 mg Intravenous Daily  . topiramate  75 mg Oral QHS  . valACYclovir  1,000 mg Oral Daily   Continuous Infusions: . azithromycin 500 mg (10/28/19 1406)  . cefTRIAXone (ROCEPHIN)  IV 1 g (10/28/19 1235)  . remdesivir 100 mg in NS 100 mL 100 mg (10/28/19 0917)     LOS: 1 day    Time spent: over 8 min    Fayrene Helper, MD [facility] Pager [email or web address]  If 7PM-7AM, please contact night-coverage [email or web address] Password [email or web address] 10/28/2019, 2:21 PM

## 2019-10-28 NOTE — Evaluation (Signed)
Clinical/Bedside Swallow Evaluation Patient Details  Name: [name] MRN: [id number] Date of Birth: 12-Jul-1986  Today's Date: 10/28/2019 Time: SLP Start Time (ACUTE ONLY): 1645 SLP Stop Time (ACUTE ONLY): 1706 SLP Time Calculation (min) (ACUTE ONLY): 21 min  Past Medical History:  Past Medical History:  Diagnosis Date  . Bipolar 1 disorder ([place])   . Depression   . Dizziness and giddiness 02/01/2016  . Herpes genitalia   . HIV disease ([place])   . Hypertension   . Migraine headache 02/01/2016  . Peripheral neuropathy 10/01/2019  . PTSD (post-traumatic stress disorder)   . Schizoaffective disorder ([place])   . Seizures ([place])    Past Surgical History:  Past Surgical History:  Procedure Laterality Date  . BACK SURGERY    . HAND SURGERY     HPI:  Patient is a 34 year old male with history of controlled HIV on medications, bipolar disorder, schizophrenia, seizures presented to ED with worsening shortness of breath and wheezing for the last 3 days.  Patient is diffusely wheezing at the time of my examination, hypoxic on 5 L O2.    Assessment / Plan / Recommendation Clinical Impression  Pt exhibits pharyngeal dysphagia either respiratory based and/or suspected incompetent glottal closure during swallow evidenced by vocal breaks. Dysphonia marked by low intensity and hoarseness with phonatory breaks during connected speech. Immediate cough response following consecutive sips, lessened but not eliminated with 2 sips max via straw. Regular texture unremarkable. Recommend continue with regular texture, thin liquids, pills whole in puree. Edcuated pt on sitting upright and small sips. If ST observes continued s/s, will recommend instrumental assessment.    SLP Visit Diagnosis: Dysphagia, unspecified (R13.10)    Aspiration Risk  Mild aspiration risk    Diet Recommendation Regular;Thin liquid   Liquid Administration via: Cup;Straw Medication Administration: Whole meds with  puree Supervision: Patient able to self feed Compensations: Slow rate;Small sips/bites Postural Changes: Seated upright at 90 degrees    Other  Recommendations Oral Care Recommendations: Oral care BID   Follow up Recommendations (TBD)      Frequency and Duration min 2x/week  2 weeks       Prognosis Prognosis for Safe Diet Advancement: Good      Swallow Study   General HPI: Patient is a 34 year old male with history of controlled HIV on medications, bipolar disorder, schizophrenia, seizures presented to ED with worsening shortness of breath and wheezing for the last 3 days.  Patient is diffusely wheezing at the time of my examination, hypoxic on 5 L O2.  Type of Study: Bedside Swallow Evaluation Previous Swallow Assessment: (none) Diet Prior to this Study: Regular;Thin liquids Respiratory Status: Nasal cannula History of Recent Intubation: No Behavior/Cognition: Alert;Cooperative;Pleasant mood Oral Cavity Assessment: Within Functional Limits Oral Care Completed by SLP: No Oral Cavity - Dentition: Adequate natural dentition Vision: Functional for self-feeding Self-Feeding Abilities: Able to feed self Patient Positioning: Upright in bed Baseline Vocal Quality: Hoarse;Low vocal intensity(dysphonic, vocal breaks) Volitional Cough: Strong Volitional Swallow: Able to elicit    Oral/Motor/Sensory Function Overall Oral Motor/Sensory Function: Within functional limits   Ice Chips Ice chips: Not tested   Thin Liquid Thin Liquid: Impaired Presentation: Cup;Straw Pharyngeal  Phase Impairments: Cough - Immediate;Cough - Delayed    Nectar Thick Nectar Thick Liquid: Not tested   Honey Thick Honey Thick Liquid: Not tested   Puree Puree: Within functional limits   Solid     Solid: Within functional limits      [name] 10/28/2019,5:42 PM  [name] [place].Ed Risk analyst [phone] Office [phone]

## 2019-10-28 NOTE — Progress Notes (Signed)
Per [place], pt had no sedatives, narcotics or opiods during ED admission. Pt was intermittently lethargic and obtunded but would come out of state and become lucid.

## 2019-10-29 LAB — COMPREHENSIVE METABOLIC PANEL
ALT: 75 U/L — ABNORMAL HIGH (ref 0–44)
AST: 146 U/L — ABNORMAL HIGH (ref 15–41)
Albumin: 2.8 g/dL — ABNORMAL LOW (ref 3.5–5.0)
Alkaline Phosphatase: 72 U/L (ref 38–126)
Anion gap: 10 (ref 5–15)
BUN: 20 mg/dL (ref 6–20)
CO2: 27 mmol/L (ref 22–32)
Calcium: 9.3 mg/dL (ref 8.9–10.3)
Chloride: 98 mmol/L (ref 98–111)
Creatinine, Ser: 0.81 mg/dL (ref 0.61–1.24)
GFR calc Af Amer: >60 mL/min (ref >60)
GFR calc non Af Amer: >60 mL/min (ref >60)
Glucose, Bld: 181 mg/dL — ABNORMAL HIGH (ref 70–99)
Potassium: 3.6 mmol/L (ref 3.5–5.1)
Sodium: 135 mmol/L (ref 135–145)
Total Bilirubin: 1 mg/dL (ref 0.3–1.2)
Total Protein: 7.3 g/dL (ref 6.5–8.1)

## 2019-10-29 LAB — CBC WITH DIFFERENTIAL/PLATELET
Abs Immature Granulocytes: 0.34 10*3/uL — ABNORMAL HIGH (ref 0.00–0.07)
Basophils Absolute: 0 10*3/uL (ref 0.0–0.1)
Basophils Relative: 0 %
Eosinophils Absolute: 0 10*3/uL (ref 0.0–0.5)
Eosinophils Relative: 0 %
HCT: 33.5 % — ABNORMAL LOW (ref 39.0–52.0)
Hemoglobin: 10.1 g/dL — ABNORMAL LOW (ref 13.0–17.0)
Immature Granulocytes: 5 %
Lymphocytes Relative: 7 %
Lymphs Abs: 0.5 10*3/uL — ABNORMAL LOW (ref 0.7–4.0)
MCH: 32.6 pg (ref 26.0–34.0)
MCHC: 30.1 g/dL (ref 30.0–36.0)
MCV: 108.1 fL — ABNORMAL HIGH (ref 80.0–100.0)
Monocytes Absolute: 0.5 10*3/uL (ref 0.1–1.0)
Monocytes Relative: 8 %
Neutro Abs: 5.7 10*3/uL (ref 1.7–7.7)
Neutrophils Relative %: 80 %
Platelets: 153 10*3/uL (ref 150–400)
RBC: 3.1 MIL/uL — ABNORMAL LOW (ref 4.22–5.81)
RDW: 15.5 % (ref 11.5–15.5)
WBC: 7.1 10*3/uL (ref 4.0–10.5)
nRBC: 0 % (ref 0.0–0.2)

## 2019-10-29 LAB — D-DIMER, QUANTITATIVE: D-Dimer, Quant: 0.77 ug/mL-FEU — ABNORMAL HIGH (ref 0.00–0.50)

## 2019-10-29 LAB — HEMOGLOBIN A1C
Hgb A1c MFr Bld: 6.1 % — ABNORMAL HIGH (ref 4.8–5.6)
Mean Plasma Glucose: 128.37 mg/dL

## 2019-10-29 LAB — MAGNESIUM: Magnesium: 2.4 mg/dL (ref 1.7–2.4)

## 2019-10-29 LAB — FERRITIN: Ferritin: 781 ng/mL — ABNORMAL HIGH (ref 24–336)

## 2019-10-29 LAB — PHOSPHORUS: Phosphorus: 2.9 mg/dL (ref 2.5–4.6)

## 2019-10-29 LAB — T4, FREE: Free T4: 0.95 ng/dL (ref 0.61–1.12)

## 2019-10-29 LAB — C-REACTIVE PROTEIN: CRP: 6.7 mg/dL — ABNORMAL HIGH (ref <1.0)

## 2019-10-29 MED ORDER — ALPRAZOLAM 0.5 MG PO TABS
0.5000 mg | ORAL_TABLET | Freq: Three times a day (TID) | ORAL | Status: DC | PRN
  Administered 2019-10-30 – 2019-11-02 (×6): 0.5 mg via ORAL
  Filled 2019-10-29 (×6): qty 1

## 2019-10-29 MED ORDER — DICLOFENAC SODIUM 1 % EX GEL
2.0000 g | Freq: Four times a day (QID) | CUTANEOUS | Status: DC
  Administered 2019-10-29 – 2019-11-02 (×8): 2 g via TOPICAL
  Filled 2019-10-29: qty 100

## 2019-10-29 MED ORDER — VANCOMYCIN HCL 2000 MG/400ML IV SOLN
2000.0000 mg | Freq: Once | INTRAVENOUS | Status: AC
  Administered 2019-10-29: 2000 mg via INTRAVENOUS
  Filled 2019-10-29: qty 400

## 2019-10-29 MED ORDER — VANCOMYCIN HCL 1750 MG/350ML IV SOLN
1750.0000 mg | Freq: Two times a day (BID) | INTRAVENOUS | Status: DC
  Filled 2019-10-29 (×2): qty 350

## 2019-10-29 NOTE — Progress Notes (Signed)
  Speech Language Pathology Treatment: Dysphagia  Patient Details Name: [name] MRN: [id number] DOB: [birth year] Today's Date: 10/29/2019 Time: 8850-2774 SLP Time Calculation (min) (ACUTE ONLY): 20 min  Assessment / Plan / Recommendation Clinical Impression  Pt exhibited increased lucidity today during continued diagnostic treatment with solids and liquids. Voice continued to have intervals of hoarseness and significant dysphonia during conversation which decreased during session. Consumed 2-3 oz water no s/s aspiration then had significant episode of immediate strong reflexive cough with water following cracker which took 3-4 min to recover. Expectorating mucous. Recommend pt have MBS to further evaluate swallow integrity (suspect aspiration/penetration during the swallow which may be missed with FEES). Suspected airway intrusion with differential diagnosis of MD's suspicion of chronic airway disease (this admission) causing incoordinated swallow sequence. Continue regular texture, thin liquids with precautions and MBS tomorrow.    HPI HPI: Patient is a 34 year old male with history of controlled HIV on medications, bipolar disorder, schizophrenia, seizures presented to ED with worsening shortness of breath and wheezing for the last 3 days.  Patient is diffusely wheezing at the time of my examination, hypoxic on 5 L O2.       SLP Plan  MBS       Recommendations  Diet recommendations: Regular;Thin liquid Liquids provided via: Cup;Straw Medication Administration: Whole meds with puree Supervision: Patient able to self feed Compensations: Slow rate;Small sips/bites Postural Changes and/or Swallow Maneuvers: Seated upright 90 degrees;Upright 30-60 min after meal                Oral Care Recommendations: Oral care BID Follow up Recommendations: Other (comment)(TBD) SLP Visit Diagnosis: Dysphagia, pharyngeal phase (R13.13) Plan: MBS       GO                [name] 10/29/2019, 3:27 PM  [phone]

## 2019-10-29 NOTE — Progress Notes (Addendum)
PROGRESS NOTE    [name]  BSJ:[id number] DOB: [birth year] DOA: 10/27/2019 PCP: [name], MD   Brief Narrative:  Patient is [name] 34 year old male with history of controlled HIV on medications, bipolar disorder, schizophrenia, seizures presented to ED with worsening shortness of breath and wheezing for the last 3 days.  Patient is diffusely wheezing at the time of my examination, hypoxic on 5 L O2.   He's being treated for COVID 19 pneumonia.  He's received steroids, remdesivir, and actemra.  Oxygen needs are improving.  Hospitalization was c/b acute metabolic encephalopathy likely 2/2 benzos and hypercarbia.   Assessment & Plan:   Principal Problem:   Acute respiratory failure due to COVID-19 [name]) Active Problems:   Schizoaffective disorder, depressive type (HCC)   GERD (gastroesophageal reflux disease)   Hypokalemia  Acute hypoxic respiratory failure secondary to COVID 19 Pneumonia   Possible Community Acquired Pneumonia - Currently requiring 2 L [place] - Continue steroids, remdesivir - received actemra 1/17 - Plasma has been ordered, not yet given - I think at this point it can be canceled given his improvement - on abx for possible CAP - I/O, daily weights - IS, OOB, prone as able  COVID-19 Labs  Recent Labs    10/27/19 1102 10/28/19 0321 10/29/19 0140  DDIMER 1.45* 1.17* 0.77*  FERRITIN 797* 919* 781*  LDH 458*  --   --   CRP 13.0* 11.3* 6.7*    Lab Results  Component Value Date   SARSCOV2NAA POSITIVE ([name]) 76/54/6503   Acute Metabolic Encephalopathy Secondary to Hypercarbic Respiratory Failure Suspect 2/2 Benzodiazepines: Somnolent on day of transfer to [place], responded to flumazenil He's back to normal today Given hypercarbia, response to flumazenil, suspect this was 2/2 benzodiazepine No documented benzos given, he takes this at home - doesn't appear he has any medications here, don't think he took any here Seen by neurology last night -> had CT head  which was negative, CTA head/neck negative for large vessel occlusion EEG normal.  B12 wnl, folate wnl, TSH slightly low (follow free T4), ammonia wnl.  ABG normalizing. MRI was ordered, but given likely related to benzo/hypercarbia, I think this can be deferred  Coag Negative Staph Bacteremia: Initially in aerobic bottle only, now in anaerobic as well, in 1 set of 2 from 11/17.  Follow repeat cultures which are pending from 1/18.  He notes frequent skin and soft tissue infections, but it doesn't appear he has any at this time, his L buttock has healing lesion which used to be an abscess, but this was treated in late December (with warm compresses and doxycycline) and he says resolved/improved early January.  He had [name] liver biopsy  Will treat with vancomycin for now, follow repeat cultures Appreciate ID assistance  Nicotine use -Patient reports half pack per day smoking, counseled on smoking cessation.  May have underlying COPD -Placed on scheduled nebs, continue IV steroids, nicotine patch    Schizoaffective disorder, depressive type (HCC) -Continue fluoxetine, lurasidone, trazodone as needed - resume xanax, low dose -Hold Haldol due to prolonged QTC  History of HIV -Continue Biktarvy - appears he gets HIV care at [facility] (care everywhere)    GERD (gastroesophageal reflux disease) -Continue Pepcid    Hypokalemia -Obtain magnesium level, placed on potassium replacement  Alcohol use - d/c CIWA and prn ativan given lethargy above - will need to resume xanax when more awake  Abnormal Liver Enzymes:  - likely 2/2 COVID vs etoh [place] - follow acute hepatitis  panel - recent liver bx on 1/11 with steatohepatitis and periportal and centrilobular fibrosis  History of seizures? (migraines?) -Continue topiramate - he denies sz and tells me he takes this for migraines  Hx Muscular Dystrophy: follow with neuro outpatient  Dyphagia: MBS per speech  DVT prophylaxis: lovenox Code  Status: full  Family Communication: none at bedside Disposition Plan: pending further improvement  Consultants:   neurology  Procedures:  EEG IMPRESSION: This study is within normal limits. No seizures or epileptiform discharges were seen throughout the recording.  Antimicrobials:  Anti-infectives (From admission, onward)   Start     Dose/Rate Route Frequency Ordered Stop   10/28/19 1000  remdesivir 100 mg in sodium chloride 0.9 % 100 mL IVPB     100 mg 200 mL/hr over 30 Minutes Intravenous Daily 10/27/19 1551 11/01/19 0959   10/27/19 2000  bictegravir-emtricitabine-tenofovir AF (BIKTARVY) 50-200-25 MG per tablet 1 tablet     1 tablet Oral Daily 10/27/19 1815     10/27/19 2000  valACYclovir (VALTREX) tablet 1,000 mg     1,000 mg Oral Daily 10/27/19 1815     10/27/19 1800  remdesivir 200 mg in sodium chloride 0.9% 250 mL IVPB     200 mg 580 mL/hr over 30 Minutes Intravenous Once 10/27/19 1551 10/28/19 0100   10/27/19 1400  azithromycin (ZITHROMAX) 500 mg in sodium chloride 0.9 % 250 mL IVPB     500 mg 250 mL/hr over 60 Minutes Intravenous Every 24 hours 10/27/19 1310     10/27/19 1330  cefTRIAXone (ROCEPHIN) 1 g in sodium chloride 0.9 % 100 mL IVPB     1 g 200 mL/hr over 30 Minutes Intravenous Every 24 hours 10/27/19 1310       Subjective: Feels progressively better  Objective: Vitals:   10/29/19 0000 10/29/19 0400 10/29/19 0800 10/29/19 1200  BP: (!) 133/95 124/76 121/81 (!) 142/91  Pulse: 87 91 87 95  Resp: 20 18  17   Temp: 97.8 F (36.6 C) 97.7 F (36.5 C)  97.8 F (36.6 C)  TempSrc: Oral Oral  Oral  SpO2: 95% 93% 98% 96%  Weight:      Height:        Intake/Output Summary (Last 24 hours) at 10/29/2019 1601 Last data filed at 10/28/2019 1800 Gross per 24 hour  Intake 240 ml  Output 950 ml  Net -710 ml   Filed Weights   10/27/19 1234  Weight: (!) 137.4 kg    Examination:  General: No acute distress. Cardiovascular: Heart sounds show [name] regular rate,  and rhythm.  Lungs: Clear to auscultation bilaterally  Abdomen: Soft, nontender, nondistended Neurological: Alert and oriented 3. Moves all extremities 4. Cranial nerves II through XII grossly intact. Skin: Warm and dry. No rashes or lesions. Extremities: No clubbing or cyanosis. No edema.   Data Reviewed: I have personally reviewed following labs and imaging studies  CBC: Recent Labs  Lab 10/27/19 1102 10/28/19 0321 10/28/19 0550 10/28/19 0916 10/29/19 0140  WBC 8.8 8.8  --   --  7.1  NEUTROABS 6.5 6.9  --   --  5.7  HGB 10.9* 11.4* 11.6* 11.2* 10.1*  HCT 33.9* 35.4* 34.0* 33.0* 33.5*  MCV 105.3* 108.9*  --   --  108.1*  PLT 127* 156  --   --  563   Basic Metabolic Panel: Recent Labs  Lab 10/27/19 1102 10/27/19 1102 10/27/19 1232 10/27/19 2055 10/28/19 0321 10/28/19 0550 10/28/19 0916 10/29/19 0140  NA 131*   < >  --  134* 136 137 137 135  K 2.9*   < >  --  4.0 4.2 4.1 4.0 3.6  CL 97*  --   --  96* 97*  --   --  98  CO2 19*  --   --  23 24  --   --  27  GLUCOSE 75  --   --  122* 138*  --   --  181*  BUN 14  --   --  16 17  --   --  20  CREATININE 1.12  --   --  1.15 1.04  --   --  0.81  CALCIUM 8.6*  --   --  9.3 9.3  --   --  9.3  MG  --   --  2.4  --  2.4  --   --  2.4  PHOS  --   --   --   --  4.9*  --   --  2.9   < > = values in this interval not displayed.   GFR: Estimated Creatinine Clearance: 190.3 mL/min (by C-G formula based on SCr of 0.81 mg/dL). Liver Function Tests: Recent Labs  Lab 10/27/19 1102 10/27/19 2055 10/28/19 0321 10/29/19 0140  AST 206* 223* 200* 146*  ALT 58* 67* 69* 75*  ALKPHOS 69 81 76 72  BILITOT 1.5* 1.4* 1.4* 1.0  PROT 6.8 7.8 8.3* 7.3  ALBUMIN 2.5* 2.7* 3.0* 2.8*   No results for input(s): LIPASE, AMYLASE in the last 168 hours. Recent Labs  Lab 10/28/19 0442  AMMONIA 17   Coagulation Profile: No results for input(s): INR, PROTIME in the last 168 hours. Cardiac Enzymes: No results for input(s): CKTOTAL, CKMB,  CKMBINDEX, TROPONINI in the last 168 hours. BNP (last 3 results) No results for input(s): PROBNP in the last 8760 hours. HbA1C: Recent Labs    10/28/19 1810  HGBA1C 6.1*   CBG: Recent Labs  Lab 10/27/19 1753 10/27/19 1937  GLUCAP 96 120*   Lipid Profile: Recent Labs    10/27/19 1102  TRIG 94   Thyroid Function Tests: Recent Labs    10/28/19 0321 10/28/19 1810  TSH 0.309*  --   FREET4  --  0.95   Anemia Panel: Recent Labs    10/28/19 0321 10/29/19 0140  VITAMINB12 390  --   FOLATE 21.3  --   FERRITIN 919* 781*   Sepsis Labs: Recent Labs  Lab 10/27/19 1102 10/27/19 1232  PROCALCITON 0.72  --   LATICACIDVEN 3.2* 2.3*    Recent Results (from the past 240 hour(s))  Blood Culture (routine x 2)     Status: None (Preliminary result)   Collection Time: 10/27/19 11:02 AM   Specimen: BLOOD RIGHT HAND  Result Value Ref Range Status   Specimen Description   Final    BLOOD RIGHT HAND Performed at [name], [place] [address], [place], [place] [address]    Special Requests   Final    BOTTLES DRAWN AEROBIC AND ANAEROBIC Blood Culture results may not be optimal due to an inadequate volume of blood received in culture bottles Performed at [facility] [address], [place], [place] [address]    Culture   Final    NO GROWTH 2 DAYS Performed at [facility] [address], [place], [place] [address]    Report Status PENDING  Incomplete  Blood Culture (routine x 2)     Status: Abnormal (Preliminary result)  Collection Time: 10/27/19 11:02 AM   Specimen: BLOOD  Result Value Ref Range Status   Specimen Description   Final    BLOOD LEFT ANTECUBITAL Performed at [facility] [address], [place], [place] [address]    Special Requests   Final    BOTTLES DRAWN AEROBIC AND ANAEROBIC Blood Culture adequate volume Performed at [facility] [address], [place], [place]  [address]    Culture  Setup Time   Final    GRAM POSITIVE COCCI IN CLUSTERS IN BOTH AEROBIC AND ANAEROBIC BOTTLES CRITICAL RESULT CALLED TO, READ BACK BY AND VERIFIED WITH: L. CHEN, PHARMD ([place])  AT 0722 ON 10/28/19 BY C. JESSUP, MT.    Culture (Emerlyn Mehlhoff)  Final    STAPHYLOCOCCUS SPECIES (COAGULASE NEGATIVE) THE SIGNIFICANCE OF ISOLATING THIS ORGANISM FROM Jamell Opfer SINGLE SET OF BLOOD CULTURES WHEN MULTIPLE SETS ARE DRAWN IS UNCERTAIN. PLEASE NOTIFY THE MICROBIOLOGY DEPARTMENT WITHIN ONE WEEK IF SPECIATION AND SENSITIVITIES ARE REQUIRED. Performed at [facility], [place] [address], [place], [place] [address]    Report Status PENDING  Incomplete  Respiratory Panel by RT PCR (Flu Dawnya Grams&B, Covid) - Nasopharyngeal Swab     Status: Abnormal   Collection Time: 10/27/19 12:32 PM   Specimen: Nasopharyngeal Swab  Result Value Ref Range Status   SARS Coronavirus 2 by RT PCR POSITIVE (Starkeisha Vanwinkle) NEGATIVE Final    Comment: RESULT CALLED TO, READ BACK BY AND VERIFIED WITH: Q.MILNER,RN [id number] @1455  BY V.WILKINS (NOTE) SARS-CoV-2 target nucleic acids are DETECTED. SARS-CoV-2 RNA is generally detectable in upper respiratory specimens  during the acute phase of infection. Positive results are indicative of the presence of the identified virus, but do not rule out bacterial infection or co-infection with other pathogens not detected by the test. Clinical correlation with patient history and other diagnostic information is necessary to determine patient infection status. The expected result is Negative. Fact Sheet for Patients:  PinkCheek.be Fact Sheet for Healthcare Providers: GravelBags.it This test is not yet approved or cleared by the [place] FDA and  has been authorized for detection and/or diagnosis of SARS-CoV-2 by FDA under an Emergency Use Authorization (EUA).  This EUA will remain in effect (meaning this test can be used)  for the duration of  the  COVID-19 declaration under Section 564(b)(1) of the Act, 21 U.S.C. section 360bbb-3(b)(1), unless the authorization is terminated or revoked sooner.    Influenza Erbie Arment by PCR NEGATIVE NEGATIVE Final   Influenza B by PCR NEGATIVE NEGATIVE Final    Comment: (NOTE) The Xpert Xpress SARS-CoV-2/FLU/RSV assay is intended as an aid in  the diagnosis of influenza from Nasopharyngeal swab specimens and  should not be used as Samanthajo Payano sole basis for treatment. Nasal washings and  aspirates are unacceptable for Xpert Xpress SARS-CoV-2/FLU/RSV  testing. Fact Sheet for Patients: PinkCheek.be Fact Sheet for Healthcare Providers: GravelBags.it This test is not yet approved or cleared by the [place] FDA and  has been authorized for detection and/or diagnosis of SARS-CoV-2 by  FDA under an Emergency Use Authorization (EUA). This EUA will remain  in effect (meaning this test can be used) for the duration of the  Covid-19 declaration under Section 564(b)(1) of the Act, 21  U.S.C. section 360bbb-3(b)(1), unless the authorization is  terminated or revoked. Performed at [name], [place] [address] [place], [place] [address]   Culture, blood (routine x 2)     Status: None (Preliminary result)   Collection Time: 10/28/19  1:53 PM   Specimen: BLOOD  Result Value Ref Range Status   Specimen Description   Final    BLOOD RIGHT ANTECUBITAL Performed at [facility] [address] [place], [place] [address]    Special Requests   Final    BOTTLES DRAWN AEROBIC ONLY Blood Culture adequate volume Performed at [facility] [address] [place] [address], [place], [place] [address]    Culture   Final    NO GROWTH < 12 HOURS Performed at [facility] [address] [place], [place] [address]    Report Status PENDING  Incomplete  Culture, blood (routine x 2)     Status: None (Preliminary result)    Collection Time: 10/28/19  6:10 PM   Specimen: BLOOD  Result Value Ref Range Status   Specimen Description   Final    BLOOD RIGHT ANTECUBITAL Performed at [facility] [address] [place], [place] [address]    Special Requests   Final    BOTTLES DRAWN AEROBIC AND ANAEROBIC Blood Culture adequate volume Performed at [facility] [address] [place], [place] [address]    Culture   Final    NO GROWTH < 12 HOURS Performed at [facility] [address] [place],  [address]    Report Status PENDING  Incomplete         Radiology Studies: EEG  Result Date: 10/28/2019 Lora Havens, MD     10/28/2019  9:57 AM Patient Name: KAYDENCE BABA MRN: [id number] Epilepsy Attending: Lora Havens Referring Physician/Provider: Dr Amie [place] Date: 10/28/2019 Duration: 22.26 mins Patient history: 34 year old male with history of HIV, now admitted with COVID-19 infection with sudden onset of altered mental status and possible left-sided weakness.  CTA did not show any acute infarct.  EEG to evaluate for seizures. Level of alertness: Awake, sleep AEDs during EEG study: Topiramate Technical aspects: This EEG study was done with scalp electrodes positioned according to the 10-20 International system of electrode placement. Electrical activity was acquired at Rmani Kapusta sampling rate of 500Hz  and reviewed with Chirsty Armistead high frequency filter of 70Hz  and Jahleah Mariscal low frequency filter of 1Hz . EEG data were recorded continuously and digitally stored. Description: During awake state, no clear posterior dominant rhythm was seen.  Sleep was characterized by vertex waves, sleep spindles (12 to 14 Hz), maximal frontocentral.  EEG also showed mixed 10 to 15 Hz alpha-beta frequencies distributed symmetrically and diffusely.  Hyperventilation and photic stimulation were not performed. IMPRESSION: This study is within normal limits. No seizures or epileptiform discharges were seen  throughout the recording. Lora Havens   CT Code Stroke CTA Head W/WO contrast  Result Date: 10/28/2019 CLINICAL DATA:  Follow-up examination for acute stroke. EXAM: CT ANGIOGRAPHY HEAD AND NECK TECHNIQUE: Multidetector CT imaging of the head and neck was performed using the standard protocol during bolus administration of intravenous contrast. Multiplanar CT image reconstructions and MIPs were obtained to evaluate the vascular anatomy. Carotid stenosis measurements (when applicable) are obtained utilizing NASCET criteria, using the distal internal carotid diameter as the denominator. CONTRAST:  116m OMNIPAQUE IOHEXOL 350 MG/ML SOLN COMPARISON:  Prior head CT from earlier same day. FINDINGS: CTA NECK FINDINGS Aortic arch: Examination technically limited by motion artifact. Visualized aortic arch of normal caliber with normal branch pattern. No hemodynamically significant stenosis seen about the origin of the great vessels. Visualized subclavian arteries widely patent. Right carotid system: Right common and internal carotid arteries  grossly patent without stenosis, dissection, or occlusion. Left carotid system: Left common and internal carotid arteries grossly patent without stenosis, dissection or occlusion. Vertebral arteries: Both vertebral arteries arise from the subclavian arteries. Right vertebral artery dominant. Vertebral arteries patent within the neck without stenosis, dissection or occlusion. Skeleton: No acute osseous abnormality. No discrete osseous lesions. Degenerative spondylosis noted at C5-6 without significant stenosis. Other neck: No other acute soft tissue abnormality within the neck. Upper chest: Extensive multifocal airspace opacities noted within the visualized lungs, likely related history of COVID infection. Visualized upper chest demonstrates no other acute finding. Review of the MIP images confirms the above findings CTA HEAD FINDINGS Anterior circulation: Internal carotid  arteries patent to the termini without stenosis or other abnormality. A1 segments patent bilaterally. Normal anterior communicating artery. Anterior cerebral arteries grossly patent to their distal aspects without stenosis. No M1 stenosis or occlusion. Negative MCA bifurcations. Distal MCA branches grossly perfused and symmetric. Posterior circulation: Vertebral arteries patent to the vertebrobasilar junction without stenosis. Posteroinferior cerebral arteries patent bilaterally. Basilar patent to its distal aspect without appreciable stenosis. Superior cerebral arteries patent bilaterally. Right PCA primarily supplied via the basilar. Left PCA supplied via Shamra Bradeen hypoplastic left P1 segment as well as Rondell Frick robust left posterior communicating artery. Both PCAs widely patent to their distal aspects. Venous sinuses: Patent. Anatomic variants: None significant. Review of the MIP images confirms the above findings IMPRESSION: 1. Negative CTA for large vessel occlusion. No significant atheromatous change about the major arterial vasculature of the head and neck. No hemodynamically significant stenosis or other acute abnormality. 2. Extensive airspace disease throughout the visualized lungs, consistent with history of COVID pneumonia. Results were discussed by telephone on 10/28/2019 at 10:30 p.m. with providerASHISH ARORA. Electronically Signed   By: Jeannine Boga M.D.   On: 10/28/2019 00:40   CT Code Stroke CTA Neck W/WO contrast  Result Date: 10/28/2019 CLINICAL DATA:  Follow-up examination for acute stroke. EXAM: CT ANGIOGRAPHY HEAD AND NECK TECHNIQUE: Multidetector CT imaging of the head and neck was performed using the standard protocol during bolus administration of intravenous contrast. Multiplanar CT image reconstructions and MIPs were obtained to evaluate the vascular anatomy. Carotid stenosis measurements (when applicable) are obtained utilizing NASCET criteria, using the distal internal carotid diameter as  the denominator. CONTRAST:  158m OMNIPAQUE IOHEXOL 350 MG/ML SOLN COMPARISON:  Prior head CT from earlier same day. FINDINGS: CTA NECK FINDINGS Aortic arch: Examination technically limited by motion artifact. Visualized aortic arch of normal caliber with normal branch pattern. No hemodynamically significant stenosis seen about the origin of the great vessels. Visualized subclavian arteries widely patent. Right carotid system: Right common and internal carotid arteries grossly patent without stenosis, dissection, or occlusion. Left carotid system: Left common and internal carotid arteries grossly patent without stenosis, dissection or occlusion. Vertebral arteries: Both vertebral arteries arise from the subclavian arteries. Right vertebral artery dominant. Vertebral arteries patent within the neck without stenosis, dissection or occlusion. Skeleton: No acute osseous abnormality. No discrete osseous lesions. Degenerative spondylosis noted at C5-6 without significant stenosis. Other neck: No other acute soft tissue abnormality within the neck. Upper chest: Extensive multifocal airspace opacities noted within the visualized lungs, likely related history of COVID infection. Visualized upper chest demonstrates no other acute finding. Review of the MIP images confirms the above findings CTA HEAD FINDINGS Anterior circulation: Internal carotid arteries patent to the termini without stenosis or other abnormality. A1 segments patent bilaterally. Normal anterior communicating artery. Anterior cerebral arteries grossly patent to their  distal aspects without stenosis. No M1 stenosis or occlusion. Negative MCA bifurcations. Distal MCA branches grossly perfused and symmetric. Posterior circulation: Vertebral arteries patent to the vertebrobasilar junction without stenosis. Posteroinferior cerebral arteries patent bilaterally. Basilar patent to its distal aspect without appreciable stenosis. Superior cerebral arteries patent  bilaterally. Right PCA primarily supplied via the basilar. Left PCA supplied via Tamanna Whitson hypoplastic left P1 segment as well as Murlean Seelye robust left posterior communicating artery. Both PCAs widely patent to their distal aspects. Venous sinuses: Patent. Anatomic variants: None significant. Review of the MIP images confirms the above findings IMPRESSION: 1. Negative CTA for large vessel occlusion. No significant atheromatous change about the major arterial vasculature of the head and neck. No hemodynamically significant stenosis or other acute abnormality. 2. Extensive airspace disease throughout the visualized lungs, consistent with history of COVID pneumonia. Results were discussed by telephone on 10/28/2019 at 10:30 p.m. with providerASHISH ARORA. Electronically Signed   By: Jeannine Boga M.D.   On: 10/28/2019 00:40   CT HEAD CODE STROKE WO CONTRAST  Result Date: 10/27/2019 CLINICAL DATA:  Code stroke. Encephalopathy. Coronavirus infection. EXAM: CT HEAD WITHOUT CONTRAST TECHNIQUE: Contiguous axial images were obtained from the base of the skull through the vertex without intravenous contrast. COMPARISON:  MRI 02/18/2016 FINDINGS: Brain: No sign of acute infarction, mass lesion, hemorrhage, hydrocephalus or extra-axial collection. Physiologic calcification of the basal ganglia. Vascular: No abnormal vascular finding. Skull: Normal Sinuses/Orbits: Clear/normal Other: None ASPECTS ([place] Stroke Program Early CT Score) - Ganglionic level infarction (caudate, lentiform nuclei, internal capsule, insula, M1-M3 cortex): 7 - Supraganglionic infarction (M4-M6 cortex): 3 Total score (0-10 with 10 being normal): 10 IMPRESSION: 1. No acute finding.  Negative head CT. 2. ASPECTS is 10 3. Call report in progress. Electronically Signed   By: Nelson Chimes M.D.   On: 10/27/2019 20:43        Scheduled Meds:  albuterol  2 puff Inhalation Q4H   bictegravir-emtricitabine-tenofovir AF  1 tablet Oral Daily   budesonide  2  puff Inhalation BID   diclofenac Sodium  2 g Topical QID   enoxaparin (LOVENOX) injection  70 mg Subcutaneous Q24H   famotidine  40 mg Oral Daily   FLUoxetine  40 mg Oral Daily   folic acid  1 mg Oral Daily   ipratropium  2 puff Inhalation Q6H   loratadine  10 mg Oral Daily   lurasidone  120 mg Oral Daily   methylPREDNISolone (SOLU-MEDROL) injection  60 mg Intravenous Q12H   multivitamin with minerals  1 tablet Oral Daily   nicotine  14 mg Transdermal Daily   thiamine  100 mg Oral Daily   Or   thiamine  100 mg Intravenous Daily   topiramate  75 mg Oral QHS   valACYclovir  1,000 mg Oral Daily   Continuous Infusions:  azithromycin Stopped (10/28/19 1505)   cefTRIAXone (ROCEPHIN)  IV 1 g (10/29/19 1311)   remdesivir 100 mg in NS 100 mL 100 mg (10/29/19 1126)     LOS: 2 days    Time spent: over 27 min    Fayrene Helper, MD [facility] Pager AMION  If 7PM-7AM, please contact night-coverage [email or web address] Password [name] 10/29/2019, 4:01 PM

## 2019-10-29 NOTE — Progress Notes (Signed)
Pharmacy Antibiotic Note  [name] is a 34 y.o. male admitted on 10/27/2019 with bacteremia.  Pharmacy has been consulted for vanc dosing.  Pt with HIV who is here for COVID. Vanc was ordered tonight after one set of blood cultures was positive for CNS on 1/17. Dr. Florene Glen would like to start vanc for now. Both sets are ngtd on 1/18. He is morbidly obese.   Scr <1, wbc wnl, currently AF. Currently on ceftriaxone/azith for PNA coverage.   Plan: Vanc 2g IV x1 then 1.75g IV q12>>AUC 450, scr 0.81 F/u with level if needed  Height: 6' 3"  (190.5 cm) Weight: (!) 302 lb 14.6 oz (137.4 kg) IBW/kg (Calculated) : 84.5  Temp (24hrs), Avg:97.8 F (36.6 C), Min:97.7 F (36.5 C), Max:97.8 F (36.6 C)  Recent Labs  Lab 10/27/19 1102 10/27/19 1232 10/27/19 2055 10/28/19 0321 10/29/19 0140  WBC 8.8  --   --  8.8 7.1  CREATININE 1.12  --  1.15 1.04 0.81  LATICACIDVEN 3.2* 2.3*  --   --   --     Estimated Creatinine Clearance: 190.3 mL/min (by C-G formula based on SCr of 0.81 mg/dL).    Allergies  Allergen Reactions  . Dapsone Other (See Comments)    Per centricity "G6PD deficient"  . Primaquine Phosphate Other (See Comments)    Per Centricity "G6PD deficient"    Antimicrobials this admission: 1/19 vanc>> 1/17 ceftriaxone>> 1/17 azith>>  Dose adjustments this admission:   Microbiology results: 1/17 blood>>1/2 sets CNS 1/18 blood>>ngtd  Onnie Boer, PharmD, [place], AAHIVP, CPP Infectious Disease Pharmacist 10/29/2019 7:13 PM

## 2019-10-29 NOTE — Plan of Care (Signed)
Pt A&Ox4. VSS, SpO2 >88% on 3L [place]. No c/o pain throughout shift. Condom cath intact, able to use BSC independently w/o issue.   Worked w/ PT, see notes for details.   Barium swallow & OT ordered for 1/20.  Will report to oncoming shift  ,[name]   Problem: Education: Goal: Knowledge of General Education information will improve Description: Including pain rating scale, medication(s)/side effects and non-pharmacologic comfort measures Outcome: Progressing   Problem: Health Behavior/Discharge Planning: Goal: Ability to manage health-related needs will improve Outcome: Progressing   Problem: Clinical Measurements: Goal: Ability to maintain clinical measurements within normal limits will improve Outcome: Progressing Goal: Will remain free from infection Outcome: Progressing Goal: Diagnostic test results will improve Outcome: Progressing Goal: Respiratory complications will improve Outcome: Progressing Goal: Cardiovascular complication will be avoided Outcome: Progressing   Problem: Activity: Goal: Risk for activity intolerance will decrease Outcome: Progressing   Problem: Nutrition: Goal: Adequate nutrition will be maintained Outcome: Progressing   Problem: Coping: Goal: Level of anxiety will decrease Outcome: Progressing   Problem: Elimination: Goal: Will not experience complications related to bowel motility Outcome: Progressing Goal: Will not experience complications related to urinary retention Outcome: Progressing   Problem: Pain Managment: Goal: General experience of comfort will improve Outcome: Progressing   Problem: Safety: Goal: Ability to remain free from injury will improve Outcome: Progressing   Problem: Skin Integrity: Goal: Risk for impaired skin integrity will decrease Outcome: Progressing   Problem: Activity: Goal: Ability to tolerate increased activity will improve Outcome: Progressing   Problem: Respiratory: Goal: Ability  to maintain a clear airway will improve Outcome: Progressing Goal: Levels of oxygenation will improve Outcome: Progressing   Problem: Education: Goal: Knowledge of risk factors and measures for prevention of condition will improve Outcome: Progressing   Problem: Coping: Goal: Psychosocial and spiritual needs will be supported Outcome: Progressing   Problem: Respiratory: Goal: Will maintain a patent airway Outcome: Progressing Goal: Complications related to the disease process, condition or treatment will be avoided or minimized Outcome: Progressing   Problem: Education: Goal: Understanding of discharge needs will improve Outcome: Progressing   Problem: Health Behavior/Discharge Planning: Goal: Ability to identify changes in lifestyle to reduce recurrence of condition will improve Outcome: Progressing   Problem: Physical Regulation: Goal: Complications related to the disease process, condition or treatment will be avoided or minimized Outcome: Progressing
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
## 2019-10-30 ENCOUNTER — Inpatient Hospital Stay (HOSPITAL_COMMUNITY): Payer: Medicare HMO

## 2019-10-30 LAB — HEPATITIS PANEL, ACUTE
HCV Ab: NONREACTIVE
Hep A IgM: NONREACTIVE
Hep B C IgM: NONREACTIVE
Hepatitis B Surface Ag: NONREACTIVE

## 2019-10-30 LAB — PROCALCITONIN: Procalcitonin: 0.15 ng/mL

## 2019-10-30 MED ORDER — TRAZODONE HCL 50 MG PO TABS
100.0000 mg | ORAL_TABLET | Freq: Every day | ORAL | Status: DC
  Administered 2019-10-30: 100 mg via ORAL
  Filled 2019-10-30: qty 2

## 2019-10-30 MED ORDER — VANCOMYCIN HCL 1750 MG/350ML IV SOLN
1750.0000 mg | Freq: Two times a day (BID) | INTRAVENOUS | Status: DC
  Administered 2019-10-30 – 2019-11-01 (×5): 1750 mg via INTRAVENOUS
  Filled 2019-10-30 (×6): qty 350

## 2019-10-30 NOTE — Consult Note (Signed)
[facility]ious Disease  Total days of antibiotics 4               Reason for Consult: CoNS bacteremia    Referring Physician: powell  Principal Problem:   Acute respiratory failure due to COVID-19 [facility]) Active Problems:   Schizoaffective disorder, depressive type ([place])   GERD (gastroesophageal reflux disease)   Hypokalemia    HPI: [name] is a 34 y.o. male with well controlled hiv disease, on biktarvy, in addition to bipolar disease, PTSD, HTN, who was admitted on 1/17 with 3 days history of cough, fever, difficulty breathing from wheezing found to have temp 101F, WBC 8, elevated inflammatory markers. CXR with patchy infiltrate found to be COVID-19 positive. He was empirically started on ceftriaxone plus azithromycin in addition to remdesivir. The patient's infectious work up included blood cx which showed 1 of 2 set positive drawn from the same time. He has not had further fever since admit. He remains on 5L. Has possible history of muscular dystrophy- has dysphagia- concern for aspiration pneumonia. Currently on day 3-4 of CAP coverage. Has had repeat blood cx NGTD at 24hr.  Past Medical History:  Diagnosis Date  . Bipolar 1 disorder ([place])   . Depression   . Dizziness and giddiness 02/01/2016  . Herpes genitalia   . HIV disease ([place])   . Hypertension   . Migraine headache 02/01/2016  . Peripheral neuropathy 10/01/2019  . PTSD (post-traumatic stress disorder)   . Schizoaffective disorder ([place])   . Seizures ([place])     Allergies:  Allergies  Allergen Reactions  . Dapsone Other (See Comments)    Per centricity "G6PD deficient"  . Primaquine Phosphate Other (See Comments)    Per Centricity "G6PD deficient"    Current antibiotics:   MEDICATIONS: . albuterol  2 puff Inhalation Q4H  . bictegravir-emtricitabine-tenofovir AF  1 tablet Oral Daily  . budesonide  2 puff Inhalation BID  . diclofenac Sodium  2 g Topical QID  . enoxaparin (LOVENOX)  injection  70 mg Subcutaneous Q24H  . famotidine  40 mg Oral Daily  . FLUoxetine  40 mg Oral Daily  . folic acid  1 mg Oral Daily  . ipratropium  2 puff Inhalation Q6H  . loratadine  10 mg Oral Daily  . lurasidone  120 mg Oral Daily  . methylPREDNISolone (SOLU-MEDROL) injection  60 mg Intravenous Q12H  . multivitamin with minerals  1 tablet Oral Daily  . nicotine  14 mg Transdermal Daily  . thiamine  100 mg Oral Daily   Or  . thiamine  100 mg Intravenous Daily  . topiramate  75 mg Oral QHS  . traZODone  100 mg Oral QHS  . valACYclovir  1,000 mg Oral Daily    Social History   Tobacco Use  . Smoking status: Current Every Day Smoker    Packs/day: 1.00    Types: Cigarettes  . Smokeless tobacco: Never Used  Substance Use Topics  . Alcohol use: Yes    Alcohol/week: 14.0 standard drinks    Types: 14 Cans of beer per week    Comment: 5-6 40oz per day  . Drug use: No    Family History  Problem Relation Age of Onset  . Alcohol abuse Mother   . Schizophrenia Father   . Depression Father   . Alcohol abuse Father   . Alcohol abuse Paternal Uncle   . Alcohol abuse Paternal Uncle      Review of  Systems  Constitutional: Negative for fever, chills, diaphoresis, activity change, appetite change, fatigue and unexpected weight change.  HENT: Negative for congestion, sore throat, rhinorrhea, sneezing, trouble swallowing and sinus pressure.  Eyes: Negative for photophobia and visual disturbance.  Respiratory: +  for cough, chest tightness, shortness of breath, wheezing and stridor.  Cardiovascular: Negative for chest pain, palpitations and leg swelling.  Gastrointestinal: Negative for nausea, vomiting, abdominal pain, diarrhea, constipation, blood in stool, abdominal distention and anal bleeding.  Genitourinary: Negative for dysuria, hematuria, flank pain and difficulty urinating.  Musculoskeletal: Negative for myalgias, back pain, joint swelling, arthralgias and gait problem.  Skin:  Negative for color change, pallor, rash and wound.  Neurological: Negative for dizziness, tremors, weakness and light-headedness.  Hematological: Negative for adenopathy. Does not bruise/bleed easily.  Psychiatric/Behavioral: Negative for behavioral problems, confusion, sleep disturbance, dysphoric mood, decreased concentration and agitation.     OBJECTIVE: Temp:  [97.5 F (36.4 C)-98 F (36.7 C)] 97.7 F (36.5 C) (01/20 1147) Pulse Rate:  [61-81] 76 (01/20 1147) Resp:  [16] 16 (01/20 1147) BP: (129-146)/(82-96) 146/96 (01/20 1147) SpO2:  [90 %-98 %] 98 % (01/20 1147) No exam - e visit  LABS: Results for orders placed or performed during the hospital encounter of 10/27/19 (from the past 48 hour(s))  Culture, blood (routine x 2)     Status: None (Preliminary result)   Collection Time: 10/28/19  1:53 PM   Specimen: BLOOD  Result Value Ref Range   Specimen Description      BLOOD RIGHT ANTECUBITAL Performed at [facility], [place] [address], [place], [place] [address]    Special Requests      BOTTLES DRAWN AEROBIC ONLY Blood Culture adequate volume Performed at [facility] [address], [place], [place] [address]    Culture      NO GROWTH 2 DAYS Performed at [facility], [place] [address], [place], [place] [address]    Report Status PENDING   Culture, blood (routine x 2)     Status: None (Preliminary result)   Collection Time: 10/28/19  6:10 PM   Specimen: BLOOD  Result Value Ref Range   Specimen Description      BLOOD RIGHT ANTECUBITAL Performed at [facility] [address] [place], [place] [address]    Special Requests      BOTTLES DRAWN AEROBIC AND ANAEROBIC Blood Culture adequate volume Performed at [facility] [address], [place], [place] [address]    Culture      NO GROWTH 2 DAYS Performed at [facility], [place] [address], [place], [place] [address]    Report Status  PENDING   Type and screen [place]     Status: None   Collection Time: 10/28/19  6:10 PM  Result Value Ref Range   ABO/RH(D) O POS    Antibody Screen NEG    Sample Expiration      10/31/2019,2359 Performed at [facility], [place] [address], [place],  [address]   Hemoglobin A1c     Status: Abnormal   Collection Time: 10/28/19  6:10 PM  Result Value Ref Range   Hgb A1c MFr Bld 6.1 (H) 4.8 - 5.6 %    Comment: (NOTE) Pre diabetes:          5.7%-6.4% Diabetes:              >6.4% Glycemic control for   <7.0% adults with  diabetes    Mean Plasma Glucose 128.37 mg/dL    Comment: Performed at [facility] [address], [place], [place] [address]  T4, free     Status: None   Collection Time: 10/28/19  6:10 PM  Result Value Ref Range   Free T4 0.95 0.61 - 1.12 ng/dL    Comment: (NOTE) Biotin ingestion may interfere with free T4 tests. If the results are inconsistent with the TSH level, previous test results, or the clinical presentation, then consider biotin interference. If needed, order repeat testing after stopping biotin. Performed at [facility], [place] [address], [place], [place] [address]   ABO/Rh     Status: None   Collection Time: 10/28/19  6:10 PM  Result Value Ref Range   ABO/RH(D)      O POS Performed at [facility], [place] [address], [place], [place] [address]   CBC with Differential/Platelet     Status: Abnormal   Collection Time: 10/29/19  1:40 AM  Result Value Ref Range   WBC 7.1 4.0 - 10.5 K/uL   RBC 3.10 (L) 4.22 - 5.81 MIL/uL   Hemoglobin 10.1 (L) 13.0 - 17.0 g/dL   HCT 33.5 (L) 39.0 - 52.0 %   MCV 108.1 (H) 80.0 - 100.0 fL   MCH 32.6 26.0 - 34.0 pg   MCHC 30.1 30.0 - 36.0 g/dL   RDW 15.5 11.5 - 15.5 %   Platelets 153 150 - 400 K/uL   nRBC 0.0 0.0 - 0.2 %   Neutrophils Relative % 80 %   Neutro Abs 5.7 1.7 - 7.7 K/uL   Lymphocytes Relative 7 %   Lymphs Abs 0.5 (L) 0.7 - 4.0 K/uL    Monocytes Relative 8 %   Monocytes Absolute 0.5 0.1 - 1.0 K/uL   Eosinophils Relative 0 %   Eosinophils Absolute 0.0 0.0 - 0.5 K/uL   Basophils Relative 0 %   Basophils Absolute 0.0 0.0 - 0.1 K/uL   Immature Granulocytes 5 %   Abs Immature Granulocytes 0.34 (H) 0.00 - 0.07 K/uL   Reactive, Benign Lymphocytes PRESENT    Polychromasia PRESENT     Comment: Performed at [facility], [place] [address], [place], [place] [address]  Comprehensive metabolic panel     Status: Abnormal   Collection Time: 10/29/19  1:40 AM  Result Value Ref Range   Sodium 135 135 - 145 mmol/L   Potassium 3.6 3.5 - 5.1 mmol/L   Chloride 98 98 - 111 mmol/L   CO2 27 22 - 32 mmol/L   Glucose, Bld 181 (H) 70 - 99 mg/dL   BUN 20 6 - 20 mg/dL   Creatinine, Ser 0.81 0.61 - 1.24 mg/dL   Calcium 9.3 8.9 - 10.3 mg/dL   Total Protein 7.3 6.5 - 8.1 g/dL   Albumin 2.8 (L) 3.5 - 5.0 g/dL   AST 146 (H) 15 - 41 U/L   ALT 75 (H) 0 - 44 U/L   Alkaline Phosphatase 72 38 - 126 U/L   Total Bilirubin 1.0 0.3 - 1.2 mg/dL   GFR calc non Af Amer >60 >60 mL/min   GFR calc Af Amer >60 >60 mL/min   Anion gap 10 5 - 15    Comment: Performed at [facility], [place] [address] [place], [place] [address]  C-reactive protein     Status: Abnormal   Collection Time: 10/29/19  1:40 AM  Result Value Ref Range  CRP 6.7 (H) <1.0 mg/dL    Comment: Performed at [facility], [place] [address], [place], [place] [address]  D-dimer, quantitative (not at [facility])     Status: Abnormal   Collection Time: 10/29/19  1:40 AM  Result Value Ref Range   D-Dimer, Quant 0.77 (H) 0.00 - 0.50 ug/mL-FEU    Comment: (NOTE) At the manufacturer cut-off of 0.50 ug/mL FEU, this assay has been documented to exclude PE with a sensitivity and negative predictive value of 97 to 99%.  At this time, this assay has not been approved by the FDA to exclude DVT/VTE. Results should be correlated with clinical  presentation. Performed at [facility], [place] [address], [place], [place] [address]   Ferritin     Status: Abnormal   Collection Time: 10/29/19  1:40 AM  Result Value Ref Range   Ferritin 781 (H) 24 - 336 ng/mL    Comment: Performed at [facility], [place] [address] [place], [place] [address]  Magnesium     Status: None   Collection Time: 10/29/19  1:40 AM  Result Value Ref Range   Magnesium 2.4 1.7 - 2.4 mg/dL    Comment: Performed at [facility], [place] [address] [place], [place] [address]  Phosphorus     Status: None   Collection Time: 10/29/19  1:40 AM  Result Value Ref Range   Phosphorus 2.9 2.5 - 4.6 mg/dL    Comment: Performed at [facility], [place] [address] [place], [place] [address]  Hepatitis panel, acute     Status: None   Collection Time: 10/29/19  1:40 AM  Result Value Ref Range   Hepatitis B Surface Ag NON REACTIVE NON REACTIVE   HCV Ab NON REACTIVE NON REACTIVE    Comment: (NOTE) Nonreactive HCV antibody screen is consistent with no HCV infections,  unless recent infection is suspected or other evidence exists to indicate HCV infection.    Hep A IgM NON REACTIVE NON REACTIVE   Hep B C IgM NON REACTIVE NON REACTIVE    Comment: Performed at [facility], [place] [address], [place], [place] [address]    MICRO:  IMAGING: DG Swallowing Func-Speech Pathology  Result Date: 10/30/2019 Objective Swallowing Evaluation: Type of Study: MBS-Modified Barium Swallow Study  Patient Details Name: [name] MRN: [id number] Date of Birth: 04-27-1986 Today's Date: 10/30/2019 Time: SLP Start Time (ACUTE ONLY): 0915 -SLP Stop Time (ACUTE ONLY): 0935 SLP Time Calculation (min) (ACUTE ONLY): 20 min Past Medical History: Past Medical History: Diagnosis Date . Bipolar 1 disorder ([place])  . Depression  . Dizziness and giddiness 02/01/2016 . Herpes genitalia  . HIV disease ([place])  . Hypertension  . Migraine  headache 02/01/2016 . Peripheral neuropathy 10/01/2019 . PTSD (post-traumatic stress disorder)  . Schizoaffective disorder ([place])  . Seizures ([place])  Past Surgical History: Past Surgical History: Procedure Laterality Date . BACK SURGERY   . HAND SURGERY   HPI: Patient is a 34 year old male with history of controlled HIV on medications, bipolar disorder, schizophrenia, seizures presented to ED with worsening shortness of breath and wheezing for the last 3 days.  Patient is diffusely wheezing at the time of my examination, hypoxic on 5 L O2. Consistent s/s aspiration x 2 days and MBS recommended.   No data recorded Assessment / Plan / Recommendation CHL IP CLINICAL IMPRESSIONS 10/30/2019 Clinical Impression Pt exhibited functional oral phase however pharyngeal swallow sequence revealed aspiration with thin barium transiently.  Pt affirms coughing during meals prior to admission and etiology of dysphagia is unclear but thought to be chronic and exacerbated with Covid.  MD suspected COPD which could lead to decreased periods of apnea during swallows and aspiration. Barium reached his pyriform sinuses and swallow was not initiated immediately and reached vocal cord level with thin resultig in immediate cough to clear. Decreased oral coordination with pill with barium spilling and filling pyriform sinsues falling onto and below vocal cords. Immediate strong, reflexive cough ejected partial barium. Subsequent cues for smaller sips with straw were better coordinated and did not enter airway. Recommend he continue regular texture, thin, SMALL sips thin via cup or straw, pills WHOLE in APPLESAUCE, intermittent throat clear and do not consume liquid with solids in oral cavity. Brief follpw up for strategies.    SLP Visit Diagnosis Dysphagia, pharyngeal phase (R13.13) Attention and concentration deficit following -- Frontal lobe and executive function deficit following -- Impact on safety and function Mild aspiration risk;Moderate  aspiration risk   CHL IP TREATMENT RECOMMENDATION 10/30/2019 Treatment Recommendations Therapy as outlined in treatment plan below   Prognosis 10/30/2019 Prognosis for Safe Diet Advancement Good Barriers to Reach Goals (No Data) Barriers/Prognosis Comment -- CHL IP DIET RECOMMENDATION 10/30/2019 SLP Diet Recommendations Regular solids;Thin liquid Liquid Administration via Cup;Straw Medication Administration Whole meds with puree Compensations Slow rate;Small sips/bites Postural Changes Seated upright at 90 degrees   CHL IP OTHER RECOMMENDATIONS 10/30/2019 Recommended Consults -- Oral Care Recommendations Oral care BID Other Recommendations --   CHL IP FOLLOW UP RECOMMENDATIONS 10/30/2019 Follow up Recommendations None   CHL IP FREQUENCY AND DURATION 10/30/2019 Speech Therapy Frequency (ACUTE ONLY) min 1 x/week Treatment Duration 1 week      CHL IP ORAL PHASE 10/30/2019 Oral Phase WFL Oral - Pudding Teaspoon -- Oral - Pudding Cup -- Oral - Honey Teaspoon -- Oral - Honey Cup -- Oral - Nectar Teaspoon -- Oral - Nectar Cup -- Oral - Nectar Straw -- Oral - Thin Teaspoon -- Oral - Thin Cup -- Oral - Thin Straw -- Oral - Puree -- Oral - Mech Soft -- Oral - Regular -- Oral - Multi-Consistency -- Oral - Pill -- Oral Phase - Comment --  CHL IP PHARYNGEAL PHASE 10/30/2019 Pharyngeal Phase Impaired Pharyngeal- Pudding Teaspoon -- Pharyngeal -- Pharyngeal- Pudding Cup -- Pharyngeal -- Pharyngeal- Honey Teaspoon -- Pharyngeal -- Pharyngeal- Honey Cup -- Pharyngeal -- Pharyngeal- Nectar Teaspoon -- Pharyngeal -- Pharyngeal- Nectar Cup -- Pharyngeal -- Pharyngeal- Nectar Straw -- Pharyngeal -- Pharyngeal- Thin Teaspoon -- Pharyngeal -- Pharyngeal- Thin Cup Delayed swallow initiation-pyriform sinuses;Penetration/Aspiration during swallow Pharyngeal Material enters airway, CONTACTS cords and not ejected out;Material enters airway, passes BELOW cords then ejected out;Material enters airway, passes BELOW cords and not ejected out despite cough  attempt by patient Pharyngeal- Thin Straw Delayed swallow initiation-pyriform sinuses Pharyngeal Material does not enter airway Pharyngeal- Puree -- Pharyngeal -- Pharyngeal- Mechanical Soft -- Pharyngeal -- Pharyngeal- Regular WFL Pharyngeal -- Pharyngeal- Multi-consistency -- Pharyngeal -- Pharyngeal- Pill WFL Pharyngeal -- Pharyngeal Comment --  CHL IP CERVICAL ESOPHAGEAL PHASE 10/30/2019 Cervical Esophageal Phase (No Data) Pudding Teaspoon -- Pudding Cup -- Honey Teaspoon -- Honey Cup -- Nectar Teaspoon -- Nectar Cup -- Nectar Straw -- Thin Teaspoon -- Thin Cup -- Thin Straw -- Puree -- Mechanical Soft -- Regular -- Multi-consistency -- Pill -- Cervical Esophageal Comment -- [name] 10/30/2019, 10:47 AM [phone]             Assessment/Plan:  34yo M with hiv disease, well controlled, with covid-19 on 5L [place] receiving remdesivir and steroids. Also found to have aspiration on modified barium swallow. Currently on CAP coverage with ceftriaxone, azithromycin plus vancomycin. Admit work up showed 1 of 2 sets with CoNS.  Viral pneumonia = I suspect his hypoxia is all due to COVID-19. Recommend discontinue CAP coverage or do shortcourse  Mild dysphagia = follow SLP recs  HIV disease = continue with biktarcy  CoNS bacteremia = recommend to discontinue vancomycin. Follow up on repeat blood cx. If positive again, then will do further work up. For now, suspect initial blood cx 1/17 was a contaminant.

## 2019-10-30 NOTE — Progress Notes (Signed)
Pharmacy Antibiotic Note  [name] is a 34 y.o. male with HIV admitted on 10/27/2019 with bacteremia. Lab called with [facility] to report GPC in 1/3 bottles from 1/18 cultures. Pharmacy has been consulted to resume vancomycin with another blood culture positive with [facility] that is "large" and may be CNS per micro.   Plan: Patient received a vancomycin load last PM.  Will not reload but will start vancomycin 1750 mg iv q 12 hours. Predicted AUC 478. SCr used 1 (0.81) Levels prn  Height: 6' 3"  (190.5 cm) Weight: (!) 302 lb 14.6 oz (137.4 kg) IBW/kg (Calculated) : 84.5  Temp (24hrs), Avg:97.7 F (36.5 C), Min:97.5 F (36.4 C), Max:98 F (36.7 C)  Recent Labs  Lab 10/27/19 1102 10/27/19 1232 10/27/19 2055 10/28/19 0321 10/29/19 0140  WBC 8.8  --   --  8.8 7.1  CREATININE 1.12  --  1.15 1.04 0.81  LATICACIDVEN 3.2* 2.3*  --   --   --     Estimated Creatinine Clearance: 190.3 mL/min (by C-G formula based on SCr of 0.81 mg/dL).    Allergies  Allergen Reactions  . Dapsone Other (See Comments)    Per centricity "G6PD deficient"  . Primaquine Phosphate Other (See Comments)    Per Centricity "G6PD deficient"    1/17 azith >> 1/17 ctx >> 1/19 vanc>> 1/20, 1/20 >> 1/17 remdes >>1/21  1/17BCx: 2/4 CNS 1/18 BCx: 1/3 GPC 1/17 flu neg, covid +  Thank you for allowing pharmacy to be a part of this patient's care.  Napoleon Form 10/30/2019 7:55 PM
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
## 2019-10-30 NOTE — Plan of Care (Signed)
Pt A&Ox4. VSS, SpO2 >88% on RA. No c/o pain throughout shift.   OOBTC for majority of shift, tolerated well. Bathed self w/ minimal assist at sink w/ OT. See notes for details   Potential d/c date 1/21.  Will report to oncoming shift  Tomie China    Problem: Education: Goal: Knowledge of General Education information will improve Description: Including pain rating scale, medication(s)/side effects and non-pharmacologic comfort measures Outcome: Progressing   Problem: Health Behavior/Discharge Planning: Goal: Ability to manage health-related needs will improve Outcome: Progressing   Problem: Clinical Measurements: Goal: Ability to maintain clinical measurements within normal limits will improve Outcome: Progressing Goal: Will remain free from infection Outcome: Progressing Goal: Diagnostic test results will improve Outcome: Progressing Goal: Respiratory complications will improve Outcome: Progressing Goal: Cardiovascular complication will be avoided Outcome: Progressing   Problem: Activity: Goal: Risk for activity intolerance will decrease Outcome: Progressing   Problem: Nutrition: Goal: Adequate nutrition will be maintained Outcome: Progressing   Problem: Coping: Goal: Level of anxiety will decrease Outcome: Progressing   Problem: Elimination: Goal: Will not experience complications related to bowel motility Outcome: Progressing Goal: Will not experience complications related to urinary retention Outcome: Progressing   Problem: Pain Managment: Goal: General experience of comfort will improve Outcome: Progressing   Problem: Safety: Goal: Ability to remain free from injury will improve Outcome: Progressing   Problem: Skin Integrity: Goal: Risk for impaired skin integrity will decrease Outcome: Progressing   Problem: Activity: Goal: Ability to tolerate increased activity will improve Outcome: Progressing   Problem: Respiratory: Goal: Ability to  maintain a clear airway will improve Outcome: Progressing Goal: Levels of oxygenation will improve Outcome: Progressing   Problem: Education: Goal: Knowledge of risk factors and measures for prevention of condition will improve Outcome: Progressing   Problem: Coping: Goal: Psychosocial and spiritual needs will be supported Outcome: Progressing   Problem: Respiratory: Goal: Will maintain a patent airway Outcome: Progressing Goal: Complications related to the disease process, condition or treatment will be avoided or minimized Outcome: Progressing   Problem: Education: Goal: Understanding of discharge needs will improve Outcome: Progressing   Problem: Health Behavior/Discharge Planning: Goal: Ability to identify changes in lifestyle to reduce recurrence of condition will improve Outcome: Progressing   Problem: Physical Regulation: Goal: Complications related to the disease process, condition or treatment will be avoided or minimized Outcome: Progressing

## 2019-10-30 NOTE — Progress Notes (Addendum)
Objective Swallowing Evaluation: Type of Study: MBS-Modified Barium Swallow Study   Patient Details  Name: [name] MRN: [id number] Date of Birth: 1986-08-18  Today's Date: 10/30/2019 Time: SLP Start Time (ACUTE ONLY): 0915 -SLP Stop Time (ACUTE ONLY): 0935  SLP Time Calculation (min) (ACUTE ONLY): 20 min   Past Medical History:  Past Medical History:  Diagnosis Date  . Bipolar 1 disorder ([place])   . Depression   . Dizziness and giddiness 02/01/2016  . Herpes genitalia   . HIV disease ([place])   . Hypertension   . Migraine headache 02/01/2016  . Peripheral neuropathy 10/01/2019  . PTSD (post-traumatic stress disorder)   . Schizoaffective disorder ([place])   . Seizures ([place])    Past Surgical History:  Past Surgical History:  Procedure Laterality Date  . BACK SURGERY    . HAND SURGERY     HPI: Patient is a 34 year old male with history of controlled HIV on medications, bipolar disorder, schizophrenia, question of muscular dystrophy (?) seizures presented to ED with worsening shortness of breath and wheezing for the last 3 days.  Patient is diffusely wheezing at the time of my examination, hypoxic on 5 L O2. Consistent s/s aspiration x 2 days and MBS recommended.     No data recorded   Assessment / Plan / Recommendation  CHL IP CLINICAL IMPRESSIONS 10/30/2019  Clinical Impression Pt exhibited functional oral phase however pharyngeal swallow sequence revealed aspiration with thin barium transiently. Pt affirms coughing during meals prior to admission and etiology of dysphagia is unclear but thought to be chronic and exacerbated with Covid.  MD suspected COPD which could lead to decreased periods of apnea during swallows and aspiration. Barium reached his pyriform sinuses and swallow was not initiated immediately and reached vocal cord level with thin resultig in immediate cough to clear. Decreased oral coordination with pill with barium spilling and filling pyriform sinsues falling  onto and below vocal cords. Immediate strong, reflexive cough ejected partial barium. Subsequent cues for smaller sips with straw were better coordinated and did not enter airway. Recommend he continue regular texture, thin, SMALL sips thin via cup or straw, pills WHOLE in APPLESAUCE, intermittent throat clear and do not consume liquid with solids in oral cavity. Brief follpw up for strategies.     **addendum:  MD stated questionable history of muscular dystrophy (?)   SLP Visit Diagnosis Dysphagia, pharyngeal phase (R13.13)  Attention and concentration deficit following --  Frontal lobe and executive function deficit following --  Impact on safety and function Mild aspiration risk;Moderate aspiration risk      CHL IP TREATMENT RECOMMENDATION 10/30/2019  Treatment Recommendations Therapy as outlined in treatment plan below     Prognosis 10/30/2019  Prognosis for Safe Diet Advancement Good  Barriers to Reach Goals (No Data)  Barriers/Prognosis Comment --    CHL IP DIET RECOMMENDATION 10/30/2019  SLP Diet Recommendations Regular solids;Thin liquid  Liquid Administration via Cup;Straw  Medication Administration Whole meds with puree  Compensations Slow rate;Small sips/bites  Postural Changes Seated upright at 90 degrees      CHL IP OTHER RECOMMENDATIONS 10/30/2019  Recommended Consults --  Oral Care Recommendations Oral care BID  Other Recommendations --      CHL IP FOLLOW UP RECOMMENDATIONS 10/30/2019  Follow up Recommendations None      CHL IP FREQUENCY AND DURATION 10/30/2019  Speech Therapy Frequency (ACUTE ONLY) min 1 x/week  Treatment Duration 1 week           CHL  IP ORAL PHASE 10/30/2019  Oral Phase WFL  Oral - Pudding Teaspoon --  Oral - Pudding Cup --  Oral - Honey Teaspoon --  Oral - Honey Cup --  Oral - Nectar Teaspoon --  Oral - Nectar Cup --  Oral - Nectar Straw --  Oral - Thin Teaspoon --  Oral - Thin Cup --  Oral - Thin Straw --  Oral - Puree --  Oral -  Mech Soft --  Oral - Regular --  Oral - Multi-Consistency --  Oral - Pill --  Oral Phase - Comment --    CHL IP PHARYNGEAL PHASE 10/30/2019  Pharyngeal Phase Impaired  Pharyngeal- Pudding Teaspoon --  Pharyngeal --  Pharyngeal- Pudding Cup --  Pharyngeal --  Pharyngeal- Honey Teaspoon --  Pharyngeal --  Pharyngeal- Honey Cup --  Pharyngeal --  Pharyngeal- Nectar Teaspoon --  Pharyngeal --  Pharyngeal- Nectar Cup --  Pharyngeal --  Pharyngeal- Nectar Straw --  Pharyngeal --  Pharyngeal- Thin Teaspoon --  Pharyngeal --  Pharyngeal- Thin Cup Delayed swallow initiation-pyriform sinuses;Penetration/Aspiration during swallow  Pharyngeal Material enters airway, CONTACTS cords and not ejected out;Material enters airway, passes BELOW cords then ejected out;Material enters airway, passes BELOW cords and not ejected out despite cough attempt by patient  Pharyngeal- Thin Straw Delayed swallow initiation-pyriform sinuses  Pharyngeal Material does not enter airway  Pharyngeal- Puree --  Pharyngeal --  Pharyngeal- Mechanical Soft --  Pharyngeal --  Pharyngeal- Regular WFL  Pharyngeal --  Pharyngeal- Multi-consistency --  Pharyngeal --  Pharyngeal- Pill WFL  Pharyngeal --  Pharyngeal Comment --     CHL IP CERVICAL ESOPHAGEAL PHASE 10/30/2019  Cervical Esophageal Phase (No Data)  Pudding Teaspoon --  Pudding Cup --  Honey Teaspoon --  Honey Cup --  Nectar Teaspoon --  Nectar Cup --  Nectar Straw --  Thin Teaspoon --  Thin Cup --  Thin Straw --  Puree --  Mechanical Soft --  Regular --  Multi-consistency --  Pill --  Cervical Esophageal Comment --     [name] 10/30/2019, 10:48 AM

## 2019-10-30 NOTE — Progress Notes (Addendum)
PROGRESS NOTE  [name]  FOY:[id number] DOB: 18-Nov-1985 DOA: 10/27/2019 PCP: [name], MD   Brief Narrative: [name] is a 34 y.o. male with a history of muscular dystrophy, peripheral neuropathy, bipolar disorder, PTSD, well-controlled HIV disease on biktarvy, G6PD deficiency and HTN who presented 1/17 with cough, fever, dyspnea found to be febrile and hypoxic with elevated inflammatory markers and CXR demonstrating patchy infiltrate and positive SARS-CoV-2 testing. Remdesivir, steroids, and empiric ceftriaxone and azithromycin were given and blood cultures were drawn.   Assessment & Plan: Principal Problem:   Acute respiratory failure due to COVID-19 [facility]) Active Problems:   Schizoaffective disorder, depressive type (HCC)   GERD (gastroesophageal reflux disease)   Hypokalemia  Acute hypoxemic and hypercarbic respiratory failure due to covid-19 pneumonia: SARS-CoV-2 PCR but not Ag on 1/17. CRP declining as expected.  - Continue remdesivir x5 days 1/17 - 1/21 - Steroids x10 days - s/p tocilizumab 1/17 - Continue antibiotics given his definite aspiration x5 days - Vitamin C, zinc - Encourage OOB, IS, FV, and awake proning if able - Tylenol and antitussives prn - Continue airborne, contact precautions while admitted. Isolation period would be recommended for 21 days from positive testing. - Enoxaparin prophylactic dose.  - Maintain euvolemia/net negative.  - Avoid NSAIDs   Acute toxic metabolic encephalopathy: Resolved. Strong suspicion for sedative-induced hypercarbia. CT head was negative, CTA head/neck negative for large vessel occlusion, EEG normal. B12 wnl, folate wnl, TSH slightly low with normal free T4, ammonia wnl.  ABG normalizing. - Minimize benzodiazepines/sedative medications - No further recommendations from neurology.   CoNS: Suspected contaminant, d/w ID.  - DC Vancomycin - Follow repeat cultures from 1/18 (NGTD x48 hrs)  Tobacco use: ~1 ppd  reported to me  - Nicotine patch ordered - Scheduled inhalers ordered - Cessation counseling provided  LFT elevation: recent liver bx on 1/11 with steatohepatitis and periportal and centrilobular fibrosis. Hepatitis panel negative.  - Continue monitoring in AM, improving.  HIV: Followed at [facility]  Aspiration, dysphagia: Likely due to covid-related weakness on chronic muscular dystrophy.  - D/w SLP will continue aspiration precautions, dysphagia diet.   History of muscular dystrophy, peripheral neuropathy:  - Continue assistance as he has available at home.  - Continue outpatient neurology evaluations.  Schizoaffective disorder, bipolar disorder, PTSD:  - Continue fluoxetine, lurasidone, low dose xanax. Holding haldol due to QT prolongation  Insomnia:  - Trazodone qHS added  Obesity: BMI 37. Noted.   GERD:  - Continue pepcid  Hypokalemia: Replace and monitor.   History of alcohol use: No withdrawal or intoxication currently.   Seizure disorder: Reportedly. EEG normal.  - Continue topiramate, though this could also cause a more chronic encephalopathy.   DVT prophylaxis: Lovenox Code Status: Full Family Communication: None at bedside Disposition Plan: Home once clinically stabilized  Consultants:   Neurology  Procedures:   EEG: This study is within normal limits. No seizures or epileptiform discharges were seen throughout the recording.  Antimicrobials:  Remdesivir  Ceftriaxone azithromycin  Vancomycin 1/19 - 1/20   Biktarvy  Subjective: No major complaints today, dyspnea has improved to mild, present at rest.   Objective: Vitals:   10/29/19 2017 10/30/19 0400 10/30/19 0800 10/30/19 1147  BP: 129/87 (!) 144/83 (!) 142/82 (!) 146/96  Pulse: 61 81 68 76  Resp:  16  16  Temp: (!) 97.5 F (36.4 C) 98 F (36.7 C)  97.7 F (36.5 C)  TempSrc: Oral Oral  Oral  SpO2: 96% 90% 91% 98%  Weight:      Height:        Intake/Output  Summary (Last 24 hours) at 10/30/2019 1202 Last data filed at 10/29/2019 1600 Gross per 24 hour  Intake --  Output 1201 ml  Net -1201 ml   Filed Weights   10/27/19 1234  Weight: (!) 137.4 kg    Gen: 34 y.o. male in no distress  Pulm: Non-labored breathing. Clear to auscultation bilaterally.  CV: Regular rate and rhythm. No murmur, rub, or gallop. No JVD, no pitting pedal edema. GI: Abdomen soft, non-tender, non-distended, with normoactive bowel sounds. No organomegaly or masses felt. Ext: Warm, no deformities Skin: No rashes, lesions or ulcers on visualized skin Neuro: Alert and oriented. No focal neurological deficits. Psych: Judgement and insight appear normal. Mood & affect appropriate.   Data Reviewed: I have personally reviewed following labs and imaging studies  CBC: Recent Labs  Lab 10/27/19 1102 10/28/19 0321 10/28/19 0550 10/28/19 0916 10/29/19 0140  WBC 8.8 8.8  --   --  7.1  NEUTROABS 6.5 6.9  --   --  5.7  HGB 10.9* 11.4* 11.6* 11.2* 10.1*  HCT 33.9* 35.4* 34.0* 33.0* 33.5*  MCV 105.3* 108.9*  --   --  108.1*  PLT 127* 156  --   --  270   Basic Metabolic Panel: Recent Labs  Lab 10/27/19 1102 10/27/19 1102 10/27/19 1232 10/27/19 2055 10/28/19 0321 10/28/19 0550 10/28/19 0916 10/29/19 0140  NA 131*   < >  --  134* 136 137 137 135  K 2.9*   < >  --  4.0 4.2 4.1 4.0 3.6  CL 97*  --   --  96* 97*  --   --  98  CO2 19*  --   --  23 24  --   --  27  GLUCOSE 75  --   --  122* 138*  --   --  181*  BUN 14  --   --  16 17  --   --  20  CREATININE 1.12  --   --  1.15 1.04  --   --  0.81  CALCIUM 8.6*  --   --  9.3 9.3  --   --  9.3  MG  --   --  2.4  --  2.4  --   --  2.4  PHOS  --   --   --   --  4.9*  --   --  2.9   < > = values in this interval not displayed.   GFR: Estimated Creatinine Clearance: 190.3 mL/min (by C-G formula based on SCr of 0.81 mg/dL). Liver Function Tests: Recent Labs  Lab 10/27/19 1102 10/27/19 2055 10/28/19 0321 10/29/19 0140   AST 206* 223* 200* 146*  ALT 58* 67* 69* 75*  ALKPHOS 69 81 76 72  BILITOT 1.5* 1.4* 1.4* 1.0  PROT 6.8 7.8 8.3* 7.3  ALBUMIN 2.5* 2.7* 3.0* 2.8*   No results for input(s): LIPASE, AMYLASE in the last 168 hours. Recent Labs  Lab 10/28/19 0442  AMMONIA 17   Coagulation Profile: No results for input(s): INR, PROTIME in the last 168 hours. Cardiac Enzymes: No results for input(s): CKTOTAL, CKMB, CKMBINDEX, TROPONINI in the last 168 hours. BNP (last 3 results) No results for input(s): PROBNP in the last 8760 hours. HbA1C: Recent Labs    10/28/19 1810  HGBA1C 6.1*   CBG: Recent Labs  Lab 10/27/19  1753 10/27/19 1937  GLUCAP 96 120*   Lipid Profile: No results for input(s): CHOL, HDL, LDLCALC, TRIG, CHOLHDL, LDLDIRECT in the last 72 hours. Thyroid Function Tests: Recent Labs    10/28/19 0321 10/28/19 1810  TSH 0.309*  --   FREET4  --  0.95   Anemia Panel: Recent Labs    10/28/19 0321 10/29/19 0140  VITAMINB12 390  --   FOLATE 21.3  --   FERRITIN 919* 781*   Urine analysis:    Component Value Date/Time   COLORURINE YELLOW 12/21/2014 [place] 12/21/2014 1006   LABSPEC >1.030 (H) 12/21/2014 1006   PHURINE 5.5 12/21/2014 1006   GLUCOSEU NEGATIVE 12/21/2014 1006   HGBUR NEGATIVE 12/21/2014 1006   BILIRUBINUR NEGATIVE 12/21/2014 1006   KETONESUR TRACE (A) 12/21/2014 1006   PROTEINUR NEGATIVE 12/21/2014 1006   UROBILINOGEN 0.2 12/21/2014 1006   NITRITE NEGATIVE 12/21/2014 1006   LEUKOCYTESUR NEGATIVE 12/21/2014 1006   Recent Results (from the past 240 hour(s))  Blood Culture (routine x 2)     Status: None (Preliminary result)   Collection Time: 10/27/19 11:02 AM   Specimen: BLOOD RIGHT HAND  Result Value Ref Range Status   Specimen Description   Final    BLOOD RIGHT HAND Performed at [facility], [place] [address] [place], [place] [address]    Special Requests   Final    BOTTLES DRAWN AEROBIC AND ANAEROBIC Blood  Culture results may not be optimal due to an inadequate volume of blood received in culture bottles Performed at [facility] [address] [place], [place] [address]    Culture   Final    NO GROWTH 3 DAYS Performed at [facility], [place] [address] [place], [place] [address]    Report Status PENDING  Incomplete  Blood Culture (routine x 2)     Status: Abnormal   Collection Time: 10/27/19 11:02 AM   Specimen: BLOOD  Result Value Ref Range Status   Specimen Description   Final    BLOOD LEFT ANTECUBITAL Performed at [facility] [address], [place], [place] [address]    Special Requests   Final    BOTTLES DRAWN AEROBIC AND ANAEROBIC Blood Culture adequate volume Performed at [facility] [address], [place], [place] [address]    Culture  Setup Time   Final    GRAM POSITIVE COCCI IN CLUSTERS IN BOTH AEROBIC AND ANAEROBIC BOTTLES CRITICAL RESULT CALLED TO, READ BACK BY AND VERIFIED WITH: L. CHEN, PHARMD ([place])  AT 4287 ON 10/28/19 BY C. JESSUP, MT.    Culture (A)  Final    STAPHYLOCOCCUS SPECIES (COAGULASE NEGATIVE) THE SIGNIFICANCE OF ISOLATING THIS ORGANISM FROM A SINGLE SET OF BLOOD CULTURES WHEN MULTIPLE SETS ARE DRAWN IS UNCERTAIN. PLEASE NOTIFY THE MICROBIOLOGY DEPARTMENT WITHIN ONE WEEK IF SPECIATION AND SENSITIVITIES ARE REQUIRED. Performed at [facility], [place] [address], [place],  [address]    Report Status 10/30/2019 FINAL  Final  Respiratory Panel by RT PCR (Flu A&B, Covid) - Nasopharyngeal Swab     Status: Abnormal   Collection Time: 10/27/19 12:32 PM   Specimen: Nasopharyngeal Swab  Result Value Ref Range Status   SARS Coronavirus 2 by RT PCR POSITIVE (A) NEGATIVE Final    Comment: RESULT CALLED TO, READ BACK BY AND VERIFIED WITH: Q.MILNER,RN [id number] @1455  BY V.WILKINS (NOTE) SARS-CoV-2 target nucleic acids are DETECTED. SARS-CoV-2 RNA is generally detectable in upper respiratory  specimens  during the acute phase of infection. Positive results are indicative of the presence of the identified virus, but do not rule out bacterial infection or co-infection with other pathogens not detected by the test. Clinical correlation with patient history and other diagnostic information is necessary to determine patient infection status. The expected result is Negative. Fact Sheet for Patients:  PinkCheek.be Fact Sheet for Healthcare Providers: GravelBags.it This test is not yet approved or cleared by the [place] FDA and  has been authorized for detection and/or diagnosis of SARS-CoV-2 by FDA under an Emergency Use Authorization (EUA).  This EUA will remain in effect (meaning this test can be used)  for the duration of  the COVID-19 declaration under Section 564(b)(1) of the Act, 21 U.S.C. section 360bbb-3(b)(1), unless the authorization is terminated or revoked sooner.    Influenza A by PCR NEGATIVE NEGATIVE Final   Influenza B by PCR NEGATIVE NEGATIVE Final    Comment: (NOTE) The Xpert Xpress SARS-CoV-2/FLU/RSV assay is intended as an aid in  the diagnosis of influenza from Nasopharyngeal swab specimens and  should not be used as a sole basis for treatment. Nasal washings and  aspirates are unacceptable for Xpert Xpress SARS-CoV-2/FLU/RSV  testing. Fact Sheet for Patients: PinkCheek.be Fact Sheet for Healthcare Providers: GravelBags.it This test is not yet approved or cleared by the [place] FDA and  has been authorized for detection and/or diagnosis of SARS-CoV-2 by  FDA under an Emergency Use Authorization (EUA). This EUA will remain  in effect (meaning this test can be used) for the duration of the  Covid-19 declaration under Section 564(b)(1) of the Act, 21  U.S.C. section 360bbb-3(b)(1), unless the authorization is  terminated or  revoked. Performed at [facility], [place] [address], [place], [place] [address]   Culture, blood (routine x 2)     Status: None (Preliminary result)   Collection Time: 10/28/19  1:53 PM   Specimen: BLOOD  Result Value Ref Range Status   Specimen Description   Final    BLOOD RIGHT ANTECUBITAL Performed at [facility] [address], [place], [place] [address]    Special Requests   Final    BOTTLES DRAWN AEROBIC ONLY Blood Culture adequate volume Performed at [facility] [address] [place], [place] [address]    Culture   Final    NO GROWTH 2 DAYS Performed at [facility] [address] [place], [place] [address]    Report Status PENDING  Incomplete  Culture, blood (routine x 2)     Status: None (Preliminary result)   Collection Time: 10/28/19  6:10 PM   Specimen: BLOOD  Result Value Ref Range Status   Specimen Description   Final    BLOOD RIGHT ANTECUBITAL Performed at [facility] [address] [place], [place] [address]    Special Requests   Final    BOTTLES DRAWN AEROBIC AND ANAEROBIC Blood Culture adequate volume Performed at [facility] [address], [place], [place] [address]    Culture   Final    NO GROWTH 2 DAYS Performed at [facility] [address], [place],  [address]    Report Status PENDING  Incomplete      Radiology Studies: DG Swallowing Func-Speech Pathology  Result Date: 10/30/2019 Objective Swallowing Evaluation: Type of Study: MBS-Modified Barium Swallow Study  Patient Details Name: SHAFIQ LARCH MRN: [id number] Date of Birth: 1985/11/29 Today's Date:  10/30/2019 Time: SLP Start Time (ACUTE ONLY): 0915 -SLP Stop Time (ACUTE ONLY): 0935 SLP Time Calculation (min) (ACUTE ONLY): 20 min Past Medical History: Past Medical History: Diagnosis Date . Bipolar 1 disorder ([place])  . Depression  . Dizziness and giddiness 02/01/2016 . Herpes  genitalia  . HIV disease ([place])  . Hypertension  . Migraine headache 02/01/2016 . Peripheral neuropathy 10/01/2019 . PTSD (post-traumatic stress disorder)  . Schizoaffective disorder ([place])  . Seizures ([place])  Past Surgical History: Past Surgical History: Procedure Laterality Date . BACK SURGERY   . HAND SURGERY   HPI: Patient is a 34 year old male with history of controlled HIV on medications, bipolar disorder, schizophrenia, seizures presented to ED with worsening shortness of breath and wheezing for the last 3 days.  Patient is diffusely wheezing at the time of my examination, hypoxic on 5 L O2. Consistent s/s aspiration x 2 days and MBS recommended.   No data recorded Assessment / Plan / Recommendation CHL IP CLINICAL IMPRESSIONS 10/30/2019 Clinical Impression Pt exhibited functional oral phase however pharyngeal swallow sequence revealed aspiration with thin barium transiently. Pt affirms coughing during meals prior to admission and etiology of dysphagia is unclear but thought to be chronic and exacerbated with Covid.  MD suspected COPD which could lead to decreased periods of apnea during swallows and aspiration. Barium reached his pyriform sinuses and swallow was not initiated immediately and reached vocal cord level with thin resultig in immediate cough to clear. Decreased oral coordination with pill with barium spilling and filling pyriform sinsues falling onto and below vocal cords. Immediate strong, reflexive cough ejected partial barium. Subsequent cues for smaller sips with straw were better coordinated and did not enter airway. Recommend he continue regular texture, thin, SMALL sips thin via cup or straw, pills WHOLE in APPLESAUCE, intermittent throat clear and do not consume liquid with solids in oral cavity. Brief follpw up for strategies.    SLP Visit Diagnosis Dysphagia, pharyngeal phase (R13.13) Attention and concentration deficit following -- Frontal lobe and executive function deficit following --  Impact on safety and function Mild aspiration risk;Moderate aspiration risk   CHL IP TREATMENT RECOMMENDATION 10/30/2019 Treatment Recommendations Therapy as outlined in treatment plan below   Prognosis 10/30/2019 Prognosis for Safe Diet Advancement Good Barriers to Reach Goals (No Data) Barriers/Prognosis Comment -- CHL IP DIET RECOMMENDATION 10/30/2019 SLP Diet Recommendations Regular solids;Thin liquid Liquid Administration via Cup;Straw Medication Administration Whole meds with puree Compensations Slow rate;Small sips/bites Postural Changes Seated upright at 90 degrees   CHL IP OTHER RECOMMENDATIONS 10/30/2019 Recommended Consults -- Oral Care Recommendations Oral care BID Other Recommendations --   CHL IP FOLLOW UP RECOMMENDATIONS 10/30/2019 Follow up Recommendations None   CHL IP FREQUENCY AND DURATION 10/30/2019 Speech Therapy Frequency (ACUTE ONLY) min 1 x/week Treatment Duration 1 week      CHL IP ORAL PHASE 10/30/2019 Oral Phase WFL Oral - Pudding Teaspoon -- Oral - Pudding Cup -- Oral - Honey Teaspoon -- Oral - Honey Cup -- Oral - Nectar Teaspoon -- Oral - Nectar Cup -- Oral - Nectar Straw -- Oral - Thin Teaspoon -- Oral - Thin Cup -- Oral - Thin Straw -- Oral - Puree -- Oral - Mech Soft -- Oral - Regular -- Oral - Multi-Consistency -- Oral - Pill -- Oral Phase - Comment --  CHL IP PHARYNGEAL PHASE 10/30/2019 Pharyngeal Phase Impaired Pharyngeal- Pudding Teaspoon -- Pharyngeal -- Pharyngeal- Pudding Cup -- Pharyngeal -- Pharyngeal- Honey Teaspoon -- Pharyngeal -- Pharyngeal- Honey Cup -- Pharyngeal --  Pharyngeal- Nectar Teaspoon -- Pharyngeal -- Pharyngeal- Nectar Cup -- Pharyngeal -- Pharyngeal- Nectar Straw -- Pharyngeal -- Pharyngeal- Thin Teaspoon -- Pharyngeal -- Pharyngeal- Thin Cup Delayed swallow initiation-pyriform sinuses;Penetration/Aspiration during swallow Pharyngeal Material enters airway, CONTACTS cords and not ejected out;Material enters airway, passes BELOW cords then ejected out;Material enters  airway, passes BELOW cords and not ejected out despite cough attempt by patient Pharyngeal- Thin Straw Delayed swallow initiation-pyriform sinuses Pharyngeal Material does not enter airway Pharyngeal- Puree -- Pharyngeal -- Pharyngeal- Mechanical Soft -- Pharyngeal -- Pharyngeal- Regular WFL Pharyngeal -- Pharyngeal- Multi-consistency -- Pharyngeal -- Pharyngeal- Pill WFL Pharyngeal -- Pharyngeal Comment --  CHL IP CERVICAL ESOPHAGEAL PHASE 10/30/2019 Cervical Esophageal Phase (No Data) Pudding Teaspoon -- Pudding Cup -- Honey Teaspoon -- Honey Cup -- Nectar Teaspoon -- Nectar Cup -- Nectar Straw -- Thin Teaspoon -- Thin Cup -- Thin Straw -- Puree -- Mechanical Soft -- Regular -- Multi-consistency -- Pill -- Cervical Esophageal Comment -- [name] 10/30/2019, 10:47 AM [phone]              Scheduled Meds: . albuterol  2 puff Inhalation Q4H  . bictegravir-emtricitabine-tenofovir AF  1 tablet Oral Daily  . budesonide  2 puff Inhalation BID  . diclofenac Sodium  2 g Topical QID  . enoxaparin (LOVENOX) injection  70 mg Subcutaneous Q24H  . famotidine  40 mg Oral Daily  . FLUoxetine  40 mg Oral Daily  . folic acid  1 mg Oral Daily  . ipratropium  2 puff Inhalation Q6H  . loratadine  10 mg Oral Daily  . lurasidone  120 mg Oral Daily  . methylPREDNISolone (SOLU-MEDROL) injection  60 mg Intravenous Q12H  . multivitamin with minerals  1 tablet Oral Daily  . nicotine  14 mg Transdermal Daily  . thiamine  100 mg Oral Daily   Or  . thiamine  100 mg Intravenous Daily  . topiramate  75 mg Oral QHS  . valACYclovir  1,000 mg Oral Daily   Continuous Infusions: . azithromycin 500 mg (10/29/19 1607)  . cefTRIAXone (ROCEPHIN)  IV 1 g (10/29/19 1311)  . remdesivir 100 mg in NS 100 mL 100 mg (10/30/19 1151)  . vancomycin       LOS: 3 days   Time spent: 25 minutes.  Patrecia Pour, MD [facility] [email or web address] 10/30/2019, 12:02 PM

## 2019-10-31 LAB — CBC WITH DIFFERENTIAL/PLATELET
Abs Immature Granulocytes: 0.35 10*3/uL — ABNORMAL HIGH (ref 0.00–0.07)
Basophils Absolute: 0 10*3/uL (ref 0.0–0.1)
Basophils Relative: 1 %
Eosinophils Absolute: 0 10*3/uL (ref 0.0–0.5)
Eosinophils Relative: 0 %
HCT: 35 % — ABNORMAL LOW (ref 39.0–52.0)
Hemoglobin: 10.7 g/dL — ABNORMAL LOW (ref 13.0–17.0)
Immature Granulocytes: 5 %
Lymphocytes Relative: 8 %
Lymphs Abs: 0.6 10*3/uL — ABNORMAL LOW (ref 0.7–4.0)
MCH: 33.4 pg (ref 26.0–34.0)
MCHC: 30.6 g/dL (ref 30.0–36.0)
MCV: 109.4 fL — ABNORMAL HIGH (ref 80.0–100.0)
Monocytes Absolute: 0.4 10*3/uL (ref 0.1–1.0)
Monocytes Relative: 5 %
Neutro Abs: 6 10*3/uL (ref 1.7–7.7)
Neutrophils Relative %: 81 %
Platelets: 180 10*3/uL (ref 150–400)
RBC: 3.2 MIL/uL — ABNORMAL LOW (ref 4.22–5.81)
RDW: 15.2 % (ref 11.5–15.5)
WBC: 7.3 10*3/uL (ref 4.0–10.5)
nRBC: 0 % (ref 0.0–0.2)

## 2019-10-31 LAB — COMPREHENSIVE METABOLIC PANEL
ALT: 128 U/L — ABNORMAL HIGH (ref 0–44)
AST: 132 U/L — ABNORMAL HIGH (ref 15–41)
Albumin: 2.9 g/dL — ABNORMAL LOW (ref 3.5–5.0)
Alkaline Phosphatase: 74 U/L (ref 38–126)
Anion gap: 10 (ref 5–15)
BUN: 21 mg/dL — ABNORMAL HIGH (ref 6–20)
CO2: 29 mmol/L (ref 22–32)
Calcium: 9.4 mg/dL (ref 8.9–10.3)
Chloride: 97 mmol/L — ABNORMAL LOW (ref 98–111)
Creatinine, Ser: 0.83 mg/dL (ref 0.61–1.24)
GFR calc Af Amer: >60 mL/min (ref >60)
GFR calc non Af Amer: >60 mL/min (ref >60)
Glucose, Bld: 196 mg/dL — ABNORMAL HIGH (ref 70–99)
Potassium: 3.7 mmol/L (ref 3.5–5.1)
Sodium: 136 mmol/L (ref 135–145)
Total Bilirubin: 1.3 mg/dL — ABNORMAL HIGH (ref 0.3–1.2)
Total Protein: 6.9 g/dL (ref 6.5–8.1)

## 2019-10-31 LAB — C-REACTIVE PROTEIN: CRP: 1.1 mg/dL — ABNORMAL HIGH (ref <1.0)

## 2019-10-31 MED ORDER — ONDANSETRON HCL 4 MG PO TABS: 8.0000 mg | ORAL_TABLET | Freq: Four times a day (QID) | ORAL | Status: DC | PRN

## 2019-10-31 MED ORDER — ONDANSETRON HCL 4 MG/2ML IJ SOLN: 4.0000 mg | Freq: Four times a day (QID) | INTRAMUSCULAR | Status: DC | PRN

## 2019-10-31 MED ORDER — NICOTINE 21 MG/24HR TD PT24: 21.0000 mg | MEDICATED_PATCH | Freq: Every day | TRANSDERMAL | Status: DC

## 2019-10-31 MED ORDER — CYCLOBENZAPRINE HCL 10 MG PO TABS
5.0000 mg | ORAL_TABLET | Freq: Three times a day (TID) | ORAL | Status: DC | PRN
  Administered 2019-11-01 (×2): 5 mg via ORAL
  Filled 2019-10-31 (×2): qty 1

## 2019-10-31 MED ORDER — TRAZODONE HCL 50 MG PO TABS
150.0000 mg | ORAL_TABLET | Freq: Every day | ORAL | Status: DC
  Administered 2019-10-31 – 2019-11-02 (×2): 150 mg via ORAL
  Filled 2019-10-31 (×2): qty 3

## 2019-10-31 MED ORDER — NICOTINE 21 MG/24HR TD PT24
21.0000 mg | MEDICATED_PATCH | Freq: Every day | TRANSDERMAL | Status: DC
  Administered 2019-10-31 – 2019-11-02 (×3): 21 mg via TRANSDERMAL
  Filled 2019-10-31 (×3): qty 1

## 2019-10-31 NOTE — Progress Notes (Signed)
Physical Therapy Treatment Patient Details Name: [name] MRN: [id number] DOB: December 31, 1985 Today's Date: 10/31/2019    History of Present Illness 34 y.o. male admitted on 10/27/19 for SOB.  Dx with acute hypoxic respiratory failure secondary to COVID 19 PNA and possible CAP, acute metabolic encepholopathy suspected benzos.  Pt with significant PMH of schizoaffective disorder, PTSD, seizures, peripheral neuropathy, HTN, HIV, herpes, bipolar d/o, hand surgery and back surgery.      PT Comments    Pleasant, cooperative male patient. Of interest, he states he also has MD. He was able to stand with RW and Close supervision, ambulation 10 steps forward and backward, but was observed later in his room ambulating with no Ad. No noted LOB. Performed LE ex as outlined, and was reminded to perform these 2-3 x daily, 9-10 times each session. Should benefit from PT to further address goals as outline for optimal functional outcomes  Follow Up Recommendations  Home health PT;Supervision - Intermittent     Equipment Recommendations  Rolling walker with 5" wheels;Other (comment)    Recommendations for Other Services       Precautions / Restrictions Precautions Precautions: Fall Precaution Comments: monitor O2 sats Restrictions Weight Bearing Restrictions: No Other Position/Activity Restrictions: He has reduced safety awareness- but has been observed ambulating in room with no AD.    Mobility  Bed Mobility Overal bed mobility: Modified Independent                Transfers Overall transfer level: Needs assistance Equipment used: Rolling walker (2 wheeled) Transfers: Sit to/from Stand Sit to Stand: Min assist         General transfer comment: from lower surface; VC for hand placement  Ambulation/Gait Ambulation/Gait assistance: Min guard   Assistive device: Rolling walker (2 wheeled) Gait Pattern/deviations: Step-through pattern;Decreased dorsiflexion - left;Decreased  dorsiflexion - right;Trunk flexed     General Gait Details: Foot slap gait pattern, cues for safe/proper RW use and upright posture. Attempted gait without AD and pt reaching for nearest support with both hands, so quickly changed to Rw. Gait on RA initially, however by the time we got back to the chair his O2 was down to 87% on RA and I re-applied 1 L O2 [place] and he was back into the 90s.  90 atrest before walking.    Stairs             Wheelchair Mobility    Modified Rankin (Stroke Patients Only)       Balance Overall balance assessment: Needs assistance Sitting-balance support: Feet supported;No upper extremity supported Sitting balance-Leahy Scale: Good     Standing balance support: Bilateral upper extremity supported;Single extremity supported Standing balance-Leahy Scale: Fair Standing balance comment: needs external support in standing.                             Cognition Arousal/Alertness: Awake/alert Behavior During Therapy: WFL for tasks assessed/performed   Area of Impairment: Attention;Safety/judgement;Awareness                   Current Attention Level: Selective Memory: Decreased short-term memory Following Commands: Follows one step commands consistently Safety/Judgement: Decreased awareness of safety;Decreased awareness of deficits   Problem Solving: Slow processing General Comments: Pt very appropriate with answers at times; self correcting mistakes at times; demonstrating improved judgement during ADL after education      Exercises General Exercises - Lower Extremity Ankle Circles/Pumps: AROM;Seated Quad Sets: AROM;Seated  Short Arc Quad: AROM;Seated Hip ABduction/ADduction: AROM;Seated Hip Flexion/Marching: AROM;Seated Other Exercises Other Exercises: Practiced pursed lip breathing. Encouraged to perform LE ex 2-3 x daily, 10 reps each    General Comments        Pertinent Vitals/Pain Pain Assessment: No/denies pain     Home Living Family/patient expects to be discharged to:: Private residence Living Arrangements: Alone Available Help at Discharge: Personal care attendant Type of Home: House Home Access: Stairs to enter            Prior Function            PT Goals (current goals can now be found in the care plan section) Acute Rehab PT Goals Patient Stated Goal: to get stronger, return to his baseline and go home.  PT Goal Formulation: With patient Time For Goal Achievement: 11/12/19 Potential to Achieve Goals: Good Progress towards PT goals: Progressing toward goals    Frequency    Min 3X/week      PT Plan      Co-evaluation              AM-PAC PT "6 Clicks" Mobility   Outcome Measure  Help needed turning from your back to your side while in a flat bed without using bedrails?: None Help needed moving from lying on your back to sitting on the side of a flat bed without using bedrails?: None Help needed moving to and from a bed to a chair (including a wheelchair)?: A Little Help needed standing up from a chair using your arms (e.g., wheelchair or bedside chair)?: None Help needed to walk in hospital room?: A Little Help needed climbing 3-5 steps with a railing? : A Little 6 Click Score: 21    End of Session   Activity Tolerance: Patient limited by fatigue Patient left: in chair;with call bell/phone within reach Nurse Communication: Mobility status       Time: 7340-3709 PT Time Calculation (min) (ACUTE ONLY): 35 min  Charges:  $Gait Training: 8-22 mins $Therapeutic Exercise: 8-22 mins                     Rollen Sox, PT # [phone] CGV cell   Casandra Doffing 10/31/2019, 5:42 PM

## 2019-10-31 NOTE — Plan of Care (Signed)
  Problem: Education: Goal: Knowledge of General Education information will improve Description: Including pain rating scale, medication(s)/side effects and non-pharmacologic comfort measures Outcome: Progressing   Problem: Health Behavior/Discharge Planning: Goal: Ability to manage health-related needs will improve Outcome: Progressing   Problem: Clinical Measurements: Goal: Ability to maintain clinical measurements within normal limits will improve Outcome: Progressing Goal: Will remain free from infection Outcome: Progressing Goal: Diagnostic test results will improve Outcome: Progressing Goal: Respiratory complications will improve Outcome: Progressing Goal: Cardiovascular complication will be avoided Outcome: Progressing   Problem: Activity: Goal: Risk for activity intolerance will decrease Outcome: Progressing   Problem: Nutrition: Goal: Adequate nutrition will be maintained Outcome: Progressing   Problem: Coping: Goal: Level of anxiety will decrease Outcome: Progressing   Problem: Elimination: Goal: Will not experience complications related to bowel motility Outcome: Progressing Goal: Will not experience complications related to urinary retention Outcome: Progressing   Problem: Pain Managment: Goal: General experience of comfort will improve Outcome: Progressing   Problem: Safety: Goal: Ability to remain free from injury will improve Outcome: Progressing   Problem: Skin Integrity: Goal: Risk for impaired skin integrity will decrease Outcome: Progressing   Problem: Activity: Goal: Ability to tolerate increased activity will improve Outcome: Progressing   Problem: Respiratory: Goal: Ability to maintain a clear airway will improve Outcome: Progressing Goal: Levels of oxygenation will improve Outcome: Progressing   Problem: Education: Goal: Knowledge of risk factors and measures for prevention of condition will improve Outcome: Progressing    Problem: Coping: Goal: Psychosocial and spiritual needs will be supported Outcome: Progressing   Problem: Respiratory: Goal: Will maintain a patent airway Outcome: Progressing Goal: Complications related to the disease process, condition or treatment will be avoided or minimized Outcome: Progressing   Problem: Education: Goal: Understanding of discharge needs will improve Outcome: Progressing   Problem: Health Behavior/Discharge Planning: Goal: Ability to identify changes in lifestyle to reduce recurrence of condition will improve Outcome: Progressing   Problem: Physical Regulation: Goal: Complications related to the disease process, condition or treatment will be avoided or minimized Outcome: Progressing

## 2019-10-31 NOTE — Progress Notes (Signed)
PROGRESS NOTE  [name]  OZD:[id number] DOB: [birth year] DOA: 10/27/2019 PCP: [name], MD   Brief Narrative: [name] is a 34 y.o. male with a history of muscular dystrophy, peripheral neuropathy, bipolar disorder, PTSD, well-controlled HIV disease on biktarvy, G6PD deficiency and HTN who presented 1/17 with cough, fever, dyspnea found to be febrile and hypoxic with elevated inflammatory markers and CXR demonstrating patchy infiltrate and positive SARS-CoV-2 testing. Remdesivir, steroids, and empiric ceftriaxone and azithromycin were given and blood cultures were drawn.   Assessment & Plan: Principal Problem:   Acute respiratory failure due to COVID-19 [facility]) Active Problems:   Schizoaffective disorder, depressive type (HCC)   GERD (gastroesophageal reflux disease)   Hypokalemia  Acute hypoxemic and hypercarbic respiratory failure due to covid-19 pneumonia: SARS-CoV-2 PCR but not Ag on 1/17. CRP declining as expected.  - Continue remdesivir x5 days 1/17 - 1/21 - Steroids x10 days - s/p tocilizumab 1/17 - Continue antibiotics given his definite aspiration x5 days - Vitamin C, zinc - Encourage OOB, IS, FV, and awake proning if able - Tylenol and antitussives prn - Continue airborne, contact precautions while admitted. Isolation period would be recommended for 21 days from positive testing. - Enoxaparin prophylactic dose.  - Maintain euvolemia/net negative.  - Avoid NSAIDs   CoNS bacteremia: No fevers, wounds, meningismus. No sedation, encephalopathy. CRP down and PCT only 0.14. Continue to suspect contaminant, though patient received tocilizumab, ongoing steroids, and a history of cutaneous infections.  - Appreciate ID consult, will get echocardiogram and monitor repeat blood cultures.  - Continuing vancomycin pending work up, ID rec's.   Acute toxic metabolic encephalopathy: Resolved. Strong suspicion for sedative-induced hypercarbia. CT head was negative, CTA  head/neck negative for large vessel occlusion, EEG normal. B12 wnl, folate wnl, TSH slightly low with normal free T4, ammonia wnl.  ABG normalizing. - Minimize benzodiazepines/sedative medications - No further recommendations from neurology.   CoNS: Suspected contaminant, d/w ID.  - DC Vancomycin - Follow repeat cultures from 1/18 (NGTD x48 hrs)  Tobacco use: ~1 ppd reported to me  - Nicotine patch ordered - Scheduled inhalers ordered - Cessation counseling provided  LFT elevation: recent liver bx on 1/11 with steatohepatitis and periportal and centrilobular fibrosis. Hepatitis panel negative.  - Continue monitoring in AM, improving.  HIV: Followed at [facility]  Aspiration, dysphagia: Likely due to covid-related weakness on chronic muscular dystrophy.  - D/w SLP will continue aspiration precautions, dysphagia diet.   History of muscular dystrophy, peripheral neuropathy:  - Continue assistance as he has available at home.  - Give flexeril low dose prn, monitor for sedation.  - Continue outpatient neurology evaluations.  Schizoaffective disorder, bipolar disorder, PTSD:  - Continue fluoxetine, lurasidone, low dose xanax. Holding haldol due to QT prolongation  Insomnia:  - Trazodone qHS increased  Obesity: BMI 37. Noted.   GERD:  - Continue pepcid  Hypokalemia: Replace and monitor.   History of alcohol use: No withdrawal or intoxication currently.   Seizure disorder: Reportedly. EEG normal.  - Continue topiramate, though this could also cause a more chronic encephalopathy.   DVT prophylaxis: Lovenox Code Status: Full Family Communication: None at bedside Disposition Plan: Home once clinically stabilized  Consultants:   Neurology  Procedures:   EEG: This study is within normal limits. No seizures or epileptiform discharges were seen throughout the recording.  Antimicrobials:  Remdesivir  Ceftriaxone azithromycin  Vancomycin 1/19 - 1/20    Biktarvy  Subjective: Does not have active  skin wounds/abscess/pain. No worsening dyspnea, in fact has liberated from oxygen. Working with PT, returning near baseline. Still having positive cultures.   Objective: Vitals:   10/31/19 0733 10/31/19 1136 10/31/19 1736 10/31/19 1737  BP: (!) 145/96 (!) 151/90    Pulse:  79    Resp: 18 17    Temp: 97.7 F (36.5 C) 97.6 F (36.4 C)    TempSrc: Oral Axillary    SpO2:  92% 93% 93%  Weight:      Height:        Intake/Output Summary (Last 24 hours) at 10/31/2019 1847 Last data filed at 10/31/2019 1639 Gross per 24 hour  Intake 946.71 ml  Output 2650 ml  Net -1703.29 ml   Filed Weights   10/27/19 1234  Weight: (!) 137.4 kg   Gen: 34 y.o. male in no distress Pulm: Nonlabored breathing room air. Clear. CV: Regular rate and rhythm. No murmur, rub, or gallop. No JVD, no dependent edema. GI: Abdomen soft, non-tender, non-distended, with normoactive bowel sounds.  Ext: Warm, no deformities Skin: No wounds, healed medial thigh wounds without exudate, erythema, tenderness Neuro: Alert and oriented. No focal neurological deficits. Psych: Judgement and insight appear fair. Mood euthymic & affect congruent. Behavior is appropriate.    Data Reviewed: I have personally reviewed following labs and imaging studies  CBC: Recent Labs  Lab 10/27/19 1102 10/27/19 1102 10/28/19 0321 10/28/19 0550 10/28/19 0916 10/29/19 0140 10/31/19 0420  WBC 8.8  --  8.8  --   --  7.1 7.3  NEUTROABS 6.5  --  6.9  --   --  5.7 6.0  HGB 10.9*   < > 11.4* 11.6* 11.2* 10.1* 10.7*  HCT 33.9*   < > 35.4* 34.0* 33.0* 33.5* 35.0*  MCV 105.3*  --  108.9*  --   --  108.1* 109.4*  PLT 127*  --  156  --   --  153 180   < > = values in this interval not displayed.   Basic Metabolic Panel: Recent Labs  Lab 10/27/19 1102 10/27/19 1102 10/27/19 1232 10/27/19 2055 10/27/19 2055 10/28/19 0321 10/28/19 0550 10/28/19 0916 10/29/19 0140 10/31/19 0420  NA 131*    < >  --  134*   < > 136 137 137 135 136  K 2.9*   < >  --  4.0   < > 4.2 4.1 4.0 3.6 3.7  CL 97*  --   --  96*  --  97*  --   --  98 97*  CO2 19*  --   --  23  --  24  --   --  27 29  GLUCOSE 75  --   --  122*  --  138*  --   --  181* 196*  BUN 14  --   --  16  --  17  --   --  20 21*  CREATININE 1.12  --   --  1.15  --  1.04  --   --  0.81 0.83  CALCIUM 8.6*  --   --  9.3  --  9.3  --   --  9.3 9.4  MG  --   --  2.4  --   --  2.4  --   --  2.4  --   PHOS  --   --   --   --   --  4.9*  --   --  2.9  --    < > =  values in this interval not displayed.   GFR: Estimated Creatinine Clearance: 185.7 mL/min (by C-G formula based on SCr of 0.83 mg/dL). Liver Function Tests: Recent Labs  Lab 10/27/19 1102 10/27/19 2055 10/28/19 0321 10/29/19 0140 10/31/19 0420  AST 206* 223* 200* 146* 132*  ALT 58* 67* 69* 75* 128*  ALKPHOS 69 81 76 72 74  BILITOT 1.5* 1.4* 1.4* 1.0 1.3*  PROT 6.8 7.8 8.3* 7.3 6.9  ALBUMIN 2.5* 2.7* 3.0* 2.8* 2.9*   No results for input(s): LIPASE, AMYLASE in the last 168 hours. Recent Labs  Lab 10/28/19 0442  AMMONIA 17   Coagulation Profile: No results for input(s): INR, PROTIME in the last 168 hours. Cardiac Enzymes: No results for input(s): CKTOTAL, CKMB, CKMBINDEX, TROPONINI in the last 168 hours. BNP (last 3 results) No results for input(s): PROBNP in the last 8760 hours. HbA1C: No results for input(s): HGBA1C in the last 72 hours. CBG: Recent Labs  Lab 10/27/19 1753 10/27/19 1937  GLUCAP 96 120*   Lipid Profile: No results for input(s): CHOL, HDL, LDLCALC, TRIG, CHOLHDL, LDLDIRECT in the last 72 hours. Thyroid Function Tests: No results for input(s): TSH, T4TOTAL, FREET4, T3FREE, THYROIDAB in the last 72 hours. Anemia Panel: Recent Labs    10/29/19 0140  FERRITIN 781*   Urine analysis:    Component Value Date/Time   COLORURINE YELLOW 12/21/2014 1006   APPEARANCEUR CLEAR 12/21/2014 1006   LABSPEC >1.030 (H) 12/21/2014 1006   PHURINE 5.5  12/21/2014 1006   GLUCOSEU NEGATIVE 12/21/2014 1006   HGBUR NEGATIVE 12/21/2014 1006   BILIRUBINUR NEGATIVE 12/21/2014 1006   KETONESUR TRACE (A) 12/21/2014 1006   PROTEINUR NEGATIVE 12/21/2014 1006   UROBILINOGEN 0.2 12/21/2014 1006   NITRITE NEGATIVE 12/21/2014 1006   LEUKOCYTESUR NEGATIVE 12/21/2014 1006   Recent Results (from the past 240 hour(s))  Blood Culture (routine x 2)     Status: None (Preliminary result)   Collection Time: 10/27/19 11:02 AM   Specimen: BLOOD RIGHT HAND  Result Value Ref Range Status   Specimen Description   Final    BLOOD RIGHT HAND Performed at [facility], [place] [address], [place], [place] [address]    Special Requests   Final    BOTTLES DRAWN AEROBIC AND ANAEROBIC Blood Culture results may not be optimal due to an inadequate volume of blood received in culture bottles Performed at [place] [address] [place], [name] [address]    Culture   Final    NO GROWTH 4 DAYS Performed at [facility], [place] [address] [place], [place] [address]    Report Status PENDING  Incomplete  Blood Culture (routine x 2)     Status: Abnormal (Preliminary result)   Collection Time: 10/27/19 11:02 AM   Specimen: BLOOD  Result Value Ref Range Status   Specimen Description   Final    BLOOD LEFT ANTECUBITAL Performed at [place] [address] [place], [place] [address]    Special Requests   Final    BOTTLES DRAWN AEROBIC AND ANAEROBIC Blood Culture adequate volume Performed at [place] [address], [place], [place] [address]    Culture  Setup Time   Final    GRAM POSITIVE COCCI IN CLUSTERS IN BOTH AEROBIC AND ANAEROBIC BOTTLES CRITICAL RESULT CALLED TO, READ BACK BY AND VERIFIED WITH: L. CHEN, PHARMD ([place])  AT 0315 ON 10/28/19 BY C. JESSUP, MT.    Culture (A)  Final  STAPHYLOCOCCUS EPIDERMIDIS CULTURE REINCUBATED FOR BETTER GROWTH SUSCEPTIBILITIES TO  FOLLOW Performed at [facility], [place] [address] [place], [place] [address]    Report Status PENDING  Incomplete  Respiratory Panel by RT PCR (Flu A&B, Covid) - Nasopharyngeal Swab     Status: Abnormal   Collection Time: 10/27/19 12:32 PM   Specimen: Nasopharyngeal Swab  Result Value Ref Range Status   SARS Coronavirus 2 by RT PCR POSITIVE (A) NEGATIVE Final    Comment: RESULT CALLED TO, READ BACK BY AND VERIFIED WITH: Q.MILNER,RN [id number] @1455  BY V.WILKINS (NOTE) SARS-CoV-2 target nucleic acids are DETECTED. SARS-CoV-2 RNA is generally detectable in upper respiratory specimens  during the acute phase of infection. Positive results are indicative of the presence of the identified virus, but do not rule out bacterial infection or co-infection with other pathogens not detected by the test. Clinical correlation with patient history and other diagnostic information is necessary to determine patient infection status. The expected result is Negative. Fact Sheet for Patients:  PinkCheek.be Fact Sheet for Healthcare Providers: GravelBags.it This test is not yet approved or cleared by the [place] FDA and  has been authorized for detection and/or diagnosis of SARS-CoV-2 by FDA under an Emergency Use Authorization (EUA).  This EUA will remain in effect (meaning this test can be used)  for the duration of  the COVID-19 declaration under Section 564(b)(1) of the Act, 21 U.S.C. section 360bbb-3(b)(1), unless the authorization is terminated or revoked sooner.    Influenza A by PCR NEGATIVE NEGATIVE Final   Influenza B by PCR NEGATIVE NEGATIVE Final    Comment: (NOTE) The Xpert Xpress SARS-CoV-2/FLU/RSV assay is intended as an aid in  the diagnosis of influenza from Nasopharyngeal swab specimens and  should not be used as a sole basis for treatment. Nasal washings and  aspirates are unacceptable for Xpert Xpress  SARS-CoV-2/FLU/RSV  testing. Fact Sheet for Patients: PinkCheek.be Fact Sheet for Healthcare Providers: GravelBags.it This test is not yet approved or cleared by the [place] FDA and  has been authorized for detection and/or diagnosis of SARS-CoV-2 by  FDA under an Emergency Use Authorization (EUA). This EUA will remain  in effect (meaning this test can be used) for the duration of the  Covid-19 declaration under Section 564(b)(1) of the Act, 21  U.S.C. section 360bbb-3(b)(1), unless the authorization is  terminated or revoked. Performed at [facility], [place] [address] [place], [place] [address]   Culture, blood (routine x 2)     Status: None (Preliminary result)   Collection Time: 10/28/19  1:53 PM   Specimen: BLOOD  Result Value Ref Range Status   Specimen Description   Final    BLOOD RIGHT ANTECUBITAL Performed at [facility] [address] [place], [place] [address]    Special Requests   Final    BOTTLES DRAWN AEROBIC ONLY Blood Culture adequate volume Performed at [place] [address], [place], [place] [address]    Culture   Final    NO GROWTH 3 DAYS Performed at [facility], [place] [address], [place], [place] [address]    Report Status PENDING  Incomplete  Culture, blood (routine x 2)     Status: Abnormal (Preliminary result)   Collection Time: 10/28/19  6:10 PM   Specimen: BLOOD  Result Value Ref Range Status   Specimen Description   Final    BLOOD RIGHT ANTECUBITAL Performed at [place]  [address] [place], [place] [address]    Special Requests   Final    BOTTLES DRAWN AEROBIC AND ANAEROBIC Blood Culture adequate volume Performed at [place] [address], [place], [place] [address]    Culture  Setup Time   Final    ANAEROBIC BOTTLE ONLY GRAM POSITIVE COCCI CRITICAL RESULT  CALLED TO, READ BACK BY AND VERIFIED WITH: S CHRISTY [facility] 10/30/19 1923 JDW    Culture (A)  Final    STAPHYLOCOCCUS EPIDERMIDIS SUSCEPTIBILITIES TO FOLLOW Performed at [facility], [place] [address], [place], [place] [address]    Report Status PENDING  Incomplete  Culture, blood (routine x 2)     Status: None (Preliminary result)   Collection Time: 10/30/19  9:45 PM   Specimen: Left Antecubital; Blood  Result Value Ref Range Status   Specimen Description   Final    LEFT ANTECUBITAL Performed at [facility] [address], [place], [place] [address]    Special Requests   Final    BOTTLES DRAWN AEROBIC AND ANAEROBIC Blood Culture adequate volume   Culture PENDING  Incomplete   Report Status PENDING  Incomplete  Culture, blood (routine x 2)     Status: None (Preliminary result)   Collection Time: 10/30/19  9:50 PM   Specimen: Left Antecubital; Blood  Result Value Ref Range Status   Specimen Description   Final    LEFT ANTECUBITAL Performed at [facility] [address], [place], [place] [address]    Special Requests   Final    BOTTLES DRAWN AEROBIC AND ANAEROBIC Blood Culture adequate volume   Culture PENDING  Incomplete   Report Status PENDING  Incomplete      Radiology Studies: DG Swallowing Func-Speech Pathology  Result Date: 10/30/2019 Objective Swallowing Evaluation: Type of Study: MBS-Modified Barium Swallow Study  Patient Details Name: JOURDYN FERRIN MRN: [id number] Date of Birth: 01/26/1986 Today's Date: 10/30/2019 Time: SLP Start Time (ACUTE ONLY): 0915 -SLP Stop Time (ACUTE ONLY): 0935 SLP Time Calculation (min) (ACUTE ONLY): 20 min Past Medical History: Past Medical History: Diagnosis Date . Bipolar 1 disorder ([place])  . Depression  . Dizziness and giddiness 02/01/2016 . Herpes genitalia  . HIV disease ([place])  . Hypertension  . Migraine headache 02/01/2016 . Peripheral neuropathy 10/01/2019 . PTSD (post-traumatic stress disorder)   . Schizoaffective disorder ([place])  . Seizures ([place])  Past Surgical History: Past Surgical History: Procedure Laterality Date . BACK SURGERY   . HAND SURGERY   HPI: Patient is a 34 year old male with history of controlled HIV on medications, bipolar disorder, schizophrenia, seizures presented to ED with worsening shortness of breath and wheezing for the last 3 days.  Patient is diffusely wheezing at the time of my examination, hypoxic on 5 L O2. Consistent s/s aspiration x 2 days and MBS recommended.   No data recorded Assessment / Plan / Recommendation CHL IP CLINICAL IMPRESSIONS 10/30/2019 Clinical Impression Pt exhibited functional oral phase however pharyngeal swallow sequence revealed aspiration with thin barium transiently. Pt affirms coughing during meals prior to admission and etiology of dysphagia is unclear but thought to be chronic and exacerbated with Covid.  MD suspected COPD which could lead to decreased periods of apnea during swallows and aspiration. Barium reached his pyriform sinuses and swallow was not initiated immediately and reached vocal cord level with thin resultig in immediate cough to clear. Decreased oral coordination with pill with barium spilling and filling pyriform sinsues falling onto and  below vocal cords. Immediate strong, reflexive cough ejected partial barium. Subsequent cues for smaller sips with straw were better coordinated and did not enter airway. Recommend he continue regular texture, thin, SMALL sips thin via cup or straw, pills WHOLE in APPLESAUCE, intermittent throat clear and do not consume liquid with solids in oral cavity. Brief follpw up for strategies.    SLP Visit Diagnosis Dysphagia, pharyngeal phase (R13.13) Attention and concentration deficit following -- Frontal lobe and executive function deficit following -- Impact on safety and function Mild aspiration risk;Moderate aspiration risk   CHL IP TREATMENT RECOMMENDATION 10/30/2019 Treatment Recommendations Therapy as  outlined in treatment plan below   Prognosis 10/30/2019 Prognosis for Safe Diet Advancement Good Barriers to Reach Goals (No Data) Barriers/Prognosis Comment -- CHL IP DIET RECOMMENDATION 10/30/2019 SLP Diet Recommendations Regular solids;Thin liquid Liquid Administration via Cup;Straw Medication Administration Whole meds with puree Compensations Slow rate;Small sips/bites Postural Changes Seated upright at 90 degrees   CHL IP OTHER RECOMMENDATIONS 10/30/2019 Recommended Consults -- Oral Care Recommendations Oral care BID Other Recommendations --   CHL IP FOLLOW UP RECOMMENDATIONS 10/30/2019 Follow up Recommendations None   CHL IP FREQUENCY AND DURATION 10/30/2019 Speech Therapy Frequency (ACUTE ONLY) min 1 x/week Treatment Duration 1 week      CHL IP ORAL PHASE 10/30/2019 Oral Phase WFL Oral - Pudding Teaspoon -- Oral - Pudding Cup -- Oral - Honey Teaspoon -- Oral - Honey Cup -- Oral - Nectar Teaspoon -- Oral - Nectar Cup -- Oral - Nectar Straw -- Oral - Thin Teaspoon -- Oral - Thin Cup -- Oral - Thin Straw -- Oral - Puree -- Oral - Mech Soft -- Oral - Regular -- Oral - Multi-Consistency -- Oral - Pill -- Oral Phase - Comment --  CHL IP PHARYNGEAL PHASE 10/30/2019 Pharyngeal Phase Impaired Pharyngeal- Pudding Teaspoon -- Pharyngeal -- Pharyngeal- Pudding Cup -- Pharyngeal -- Pharyngeal- Honey Teaspoon -- Pharyngeal -- Pharyngeal- Honey Cup -- Pharyngeal -- Pharyngeal- Nectar Teaspoon -- Pharyngeal -- Pharyngeal- Nectar Cup -- Pharyngeal -- Pharyngeal- Nectar Straw -- Pharyngeal -- Pharyngeal- Thin Teaspoon -- Pharyngeal -- Pharyngeal- Thin Cup Delayed swallow initiation-pyriform sinuses;Penetration/Aspiration during swallow Pharyngeal Material enters airway, CONTACTS cords and not ejected out;Material enters airway, passes BELOW cords then ejected out;Material enters airway, passes BELOW cords and not ejected out despite cough attempt by patient Pharyngeal- Thin Straw Delayed swallow initiation-pyriform sinuses Pharyngeal  Material does not enter airway Pharyngeal- Puree -- Pharyngeal -- Pharyngeal- Mechanical Soft -- Pharyngeal -- Pharyngeal- Regular WFL Pharyngeal -- Pharyngeal- Multi-consistency -- Pharyngeal -- Pharyngeal- Pill WFL Pharyngeal -- Pharyngeal Comment --  CHL IP CERVICAL ESOPHAGEAL PHASE 10/30/2019 Cervical Esophageal Phase (No Data) Pudding Teaspoon -- Pudding Cup -- Honey Teaspoon -- Honey Cup -- Nectar Teaspoon -- Nectar Cup -- Nectar Straw -- Thin Teaspoon -- Thin Cup -- Thin Straw -- Puree -- Mechanical Soft -- Regular -- Multi-consistency -- Pill -- Cervical Esophageal Comment -- [name] 10/30/2019, 10:47 AM [phone]              Scheduled Meds: . albuterol  2 puff Inhalation Q4H  . bictegravir-emtricitabine-tenofovir AF  1 tablet Oral Daily  . budesonide  2 puff Inhalation BID  . diclofenac Sodium  2 g Topical QID  . enoxaparin (LOVENOX) injection  70 mg Subcutaneous Q24H  . famotidine  40 mg Oral Daily  . FLUoxetine  40 mg Oral Daily  . folic acid  1 mg Oral Daily  . ipratropium  2 puff Inhalation Q6H  .  loratadine  10 mg Oral Daily  . lurasidone  120 mg Oral Daily  . methylPREDNISolone (SOLU-MEDROL) injection  60 mg Intravenous Q12H  . multivitamin with minerals  1 tablet Oral Daily  . nicotine  21 mg Transdermal Daily  . thiamine  100 mg Oral Daily   Or  . thiamine  100 mg Intravenous Daily  . topiramate  75 mg Oral QHS  . traZODone  150 mg Oral QHS  . valACYclovir  1,000 mg Oral Daily   Continuous Infusions: . azithromycin 500 mg (10/31/19 1458)  . cefTRIAXone (ROCEPHIN)  IV 1 g (10/31/19 1344)  . vancomycin 1,750 mg (10/31/19 1105)     LOS: 4 days   Time spent: 25 minutes.  Patrecia Pour, MD [facility] [email or web address] 10/31/2019, 6:47 PM

## 2019-10-31 NOTE — Progress Notes (Signed)
[facility] Infectious [facility]    Date of Admission:  10/27/2019   Total days of antibiotics 5           ID: [name] is a 34 y.o. male with  Recurrent +gpc-  Principal Problem:   Acute respiratory failure due to COVID-19 [facility]) Active Problems:   Schizoaffective disorder, depressive type ([place])   GERD (gastroesophageal reflux disease)   Hypokalemia    Subjective: Afebrile but surveillance blood cx showed 1/18 staph epi as well as on 1/17 staph epi  Medications:  . albuterol  2 puff Inhalation Q4H  . bictegravir-emtricitabine-tenofovir AF  1 tablet Oral Daily  . budesonide  2 puff Inhalation BID  . diclofenac Sodium  2 g Topical QID  . enoxaparin (LOVENOX) injection  70 mg Subcutaneous Q24H  . famotidine  40 mg Oral Daily  . FLUoxetine  40 mg Oral Daily  . folic acid  1 mg Oral Daily  . ipratropium  2 puff Inhalation Q6H  . loratadine  10 mg Oral Daily  . lurasidone  120 mg Oral Daily  . methylPREDNISolone (SOLU-MEDROL) injection  60 mg Intravenous Q12H  . multivitamin with minerals  1 tablet Oral Daily  . nicotine  21 mg Transdermal Daily  . thiamine  100 mg Oral Daily   Or  . thiamine  100 mg Intravenous Daily  . topiramate  75 mg Oral QHS  . traZODone  150 mg Oral QHS  . valACYclovir  1,000 mg Oral Daily    Objective: Vital signs in last 24 hours: Temp:  [97.6 F (36.4 C)-97.7 F (36.5 C)] 97.6 F (36.4 C) (01/21 1136) Pulse Rate:  [63-90] 79 (01/21 1136) Resp:  [16-18] 17 (01/21 1136) BP: (125-166)/(89-99) 151/90 (01/21 1136) SpO2:  [92 %-99 %] 92 % (01/21 1136)   No exam  Lab Results Recent Labs    10/29/19 0140 10/31/19 0420  WBC 7.1 7.3  HGB 10.1* 10.7*  HCT 33.5* 35.0*  NA 135 136  K 3.6 3.7  CL 98 97*  CO2 27 29  BUN 20 21*  CREATININE 0.81 0.83   Liver Panel Recent Labs    10/29/19 0140 10/31/19 0420  PROT 7.3 6.9  ALBUMIN 2.8* 2.9*  AST 146* 132*  ALT 75* 128*  ALKPHOS 72 74  BILITOT 1.0 1.3*    Sedimentation Rate No results for input(s): ESRSEDRATE in the last 72 hours. C-Reactive Protein Recent Labs    10/29/19 0140 10/31/19 0420  CRP 6.7* 1.1*    Microbiology: 1/17 blood cx staph epi 1/18 blood cx staph epi 1/20 blood cx pending Studies/Results: DG Swallowing Func-Speech Pathology  Result Date: 10/30/2019 Objective Swallowing Evaluation: Type of Study: MBS-Modified Barium Swallow Study  Patient Details Name: [name] MRN: [id number] Date of Birth: 1986/01/30 Today's Date: 10/30/2019 Time: SLP Start Time (ACUTE ONLY): 0915 -SLP Stop Time (ACUTE ONLY): 0935 SLP Time Calculation (min) (ACUTE ONLY): 20 min Past Medical History: Past Medical History: Diagnosis Date . Bipolar 1 disorder ([place])  . Depression  . Dizziness and giddiness 02/01/2016 . Herpes genitalia  . HIV disease ([place])  . Hypertension  . Migraine headache 02/01/2016 . Peripheral neuropathy 10/01/2019 . PTSD (post-traumatic stress disorder)  . Schizoaffective disorder ([place])  . Seizures ([place])  Past Surgical History: Past Surgical History: Procedure Laterality Date . BACK SURGERY   . HAND SURGERY   HPI: Patient is a 34 year old male with history of controlled HIV on medications, bipolar disorder, schizophrenia, seizures presented  to ED with worsening shortness of breath and wheezing for the last 3 days.  Patient is diffusely wheezing at the time of my examination, hypoxic on 5 L O2. Consistent s/s aspiration x 2 days and MBS recommended.   No data recorded Assessment / Plan / Recommendation CHL IP CLINICAL IMPRESSIONS 10/30/2019 Clinical Impression Pt exhibited functional oral phase however pharyngeal swallow sequence revealed aspiration with thin barium transiently. Pt affirms coughing during meals prior to admission and etiology of dysphagia is unclear but thought to be chronic and exacerbated with Covid.  MD suspected COPD which could lead to decreased periods of apnea during swallows and aspiration. Barium reached his  pyriform sinuses and swallow was not initiated immediately and reached vocal cord level with thin resultig in immediate cough to clear. Decreased oral coordination with pill with barium spilling and filling pyriform sinsues falling onto and below vocal cords. Immediate strong, reflexive cough ejected partial barium. Subsequent cues for smaller sips with straw were better coordinated and did not enter airway. Recommend he continue regular texture, thin, SMALL sips thin via cup or straw, pills WHOLE in APPLESAUCE, intermittent throat clear and do not consume liquid with solids in oral cavity. Brief follpw up for strategies.    SLP Visit Diagnosis Dysphagia, pharyngeal phase (R13.13) Attention and concentration deficit following -- Frontal lobe and executive function deficit following -- Impact on safety and function Mild aspiration risk;Moderate aspiration risk   CHL IP TREATMENT RECOMMENDATION 10/30/2019 Treatment Recommendations Therapy as outlined in treatment plan below   Prognosis 10/30/2019 Prognosis for Safe Diet Advancement Good Barriers to Reach Goals (No Data) Barriers/Prognosis Comment -- CHL IP DIET RECOMMENDATION 10/30/2019 SLP Diet Recommendations Regular solids;Thin liquid Liquid Administration via Cup;Straw Medication Administration Whole meds with puree Compensations Slow rate;Small sips/bites Postural Changes Seated upright at 90 degrees   CHL IP OTHER RECOMMENDATIONS 10/30/2019 Recommended Consults -- Oral Care Recommendations Oral care BID Other Recommendations --   CHL IP FOLLOW UP RECOMMENDATIONS 10/30/2019 Follow up Recommendations None   CHL IP FREQUENCY AND DURATION 10/30/2019 Speech Therapy Frequency (ACUTE ONLY) min 1 x/week Treatment Duration 1 week      CHL IP ORAL PHASE 10/30/2019 Oral Phase WFL Oral - Pudding Teaspoon -- Oral - Pudding Cup -- Oral - Honey Teaspoon -- Oral - Honey Cup -- Oral - Nectar Teaspoon -- Oral - Nectar Cup -- Oral - Nectar Straw -- Oral - Thin Teaspoon -- Oral - Thin Cup  -- Oral - Thin Straw -- Oral - Puree -- Oral - Mech Soft -- Oral - Regular -- Oral - Multi-Consistency -- Oral - Pill -- Oral Phase - Comment --  CHL IP PHARYNGEAL PHASE 10/30/2019 Pharyngeal Phase Impaired Pharyngeal- Pudding Teaspoon -- Pharyngeal -- Pharyngeal- Pudding Cup -- Pharyngeal -- Pharyngeal- Honey Teaspoon -- Pharyngeal -- Pharyngeal- Honey Cup -- Pharyngeal -- Pharyngeal- Nectar Teaspoon -- Pharyngeal -- Pharyngeal- Nectar Cup -- Pharyngeal -- Pharyngeal- Nectar Straw -- Pharyngeal -- Pharyngeal- Thin Teaspoon -- Pharyngeal -- Pharyngeal- Thin Cup Delayed swallow initiation-pyriform sinuses;Penetration/Aspiration during swallow Pharyngeal Material enters airway, CONTACTS cords and not ejected out;Material enters airway, passes BELOW cords then ejected out;Material enters airway, passes BELOW cords and not ejected out despite cough attempt by patient Pharyngeal- Thin Straw Delayed swallow initiation-pyriform sinuses Pharyngeal Material does not enter airway Pharyngeal- Puree -- Pharyngeal -- Pharyngeal- Mechanical Soft -- Pharyngeal -- Pharyngeal- Regular WFL Pharyngeal -- Pharyngeal- Multi-consistency -- Pharyngeal -- Pharyngeal- Pill WFL Pharyngeal -- Pharyngeal Comment --  CHL IP CERVICAL ESOPHAGEAL PHASE 10/30/2019 Cervical  Esophageal Phase (No Data) Pudding Teaspoon -- Pudding Cup -- Honey Teaspoon -- Honey Cup -- Nectar Teaspoon -- Nectar Cup -- Nectar Straw -- Thin Teaspoon -- Thin Cup -- Thin Straw -- Puree -- Mechanical Soft -- Regular -- Multi-consistency -- Pill -- Cervical Esophageal Comment -- [name] 10/30/2019, 10:47 AM [phone]               Assessment/Plan: Gram positive bacteremia = continue on vancomyin (restarted) since ongoing bacteremia. I have spoken to lab to not only do identification of staph epi but also sensitivities to see if same isolate. Initially thought 1/17 was contaminant (only 1 set positive)  If truly ongoing bacteremia, may consider getting TTE  to evaluate for any endocarditis.  Viral pneumonia from covid = continue on remdesivir and steroids  [facility] Cell: [phone] Pager: [phone]  10/31/2019, 3:38 PM

## 2019-11-01 ENCOUNTER — Inpatient Hospital Stay (HOSPITAL_COMMUNITY): Payer: Medicare HMO

## 2019-11-01 DIAGNOSIS — R7881 Bacteremia: Secondary | ICD-10-CM

## 2019-11-01 LAB — COMPREHENSIVE METABOLIC PANEL
ALT: 136 U/L — ABNORMAL HIGH (ref 0–44)
AST: 121 U/L — ABNORMAL HIGH (ref 15–41)
Albumin: 2.9 g/dL — ABNORMAL LOW (ref 3.5–5.0)
Alkaline Phosphatase: 83 U/L (ref 38–126)
Anion gap: 10 (ref 5–15)
BUN: 24 mg/dL — ABNORMAL HIGH (ref 6–20)
CO2: 30 mmol/L (ref 22–32)
Calcium: 9.6 mg/dL (ref 8.9–10.3)
Chloride: 97 mmol/L — ABNORMAL LOW (ref 98–111)
Creatinine, Ser: 1.04 mg/dL (ref 0.61–1.24)
GFR calc Af Amer: >60 mL/min (ref >60)
GFR calc non Af Amer: >60 mL/min (ref >60)
Glucose, Bld: 123 mg/dL — ABNORMAL HIGH (ref 70–99)
Potassium: 3.3 mmol/L — ABNORMAL LOW (ref 3.5–5.1)
Sodium: 137 mmol/L (ref 135–145)
Total Bilirubin: 0.9 mg/dL (ref 0.3–1.2)
Total Protein: 6.8 g/dL (ref 6.5–8.1)

## 2019-11-01 LAB — CULTURE, BLOOD (ROUTINE X 2): Culture: NO GROWTH

## 2019-11-01 LAB — GLUCOSE, CAPILLARY
Glucose-Capillary: 179 mg/dL — ABNORMAL HIGH (ref 70–99)
Glucose-Capillary: 122 mg/dL — ABNORMAL HIGH (ref 70–99)
Glucose-Capillary: 170 mg/dL — ABNORMAL HIGH (ref 70–99)

## 2019-11-01 LAB — ECHOCARDIOGRAM COMPLETE
Height: 75 in
Weight: 4846.59 oz

## 2019-11-01 LAB — C-REACTIVE PROTEIN: CRP: 0.8 mg/dL (ref <1.0)

## 2019-11-01 MED ORDER — POTASSIUM CHLORIDE CRYS ER 20 MEQ PO TBCR
40.0000 meq | EXTENDED_RELEASE_TABLET | Freq: Once | ORAL | Status: AC
  Administered 2019-11-01: 40 meq via ORAL
  Filled 2019-11-01: qty 2

## 2019-11-01 NOTE — Progress Notes (Signed)
PROGRESS NOTE  LADARRIAN ASENCIO  UMP:[id number] DOB: 1986/05/06 DOA: 10/27/2019 PCP: [name], MD   Brief Narrative: QUANTAE MARTEL is a 34 y.o. male with a history of muscular dystrophy, peripheral neuropathy, bipolar disorder, PTSD, well-controlled HIV disease on biktarvy, G6PD deficiency and HTN who presented 1/17 with cough, fever, dyspnea found to be febrile and hypoxic with elevated inflammatory markers and CXR demonstrating patchy infiltrate and positive SARS-CoV-2 testing. Remdesivir, steroids, and empiric ceftriaxone and azithromycin were given and blood cultures were drawn.   Assessment & Plan: Principal Problem:   Acute respiratory failure due to COVID-19 [facility]) Active Problems:   Schizoaffective disorder, depressive type (HCC)   GERD (gastroesophageal reflux disease)   Hypokalemia  Acute hypoxemic and hypercarbic respiratory failure due to [id number] pneumonia complicated by aspiration pneumonia: SARS-CoV-2 PCR but not Ag on 1/17. CRP declining as expected.  - Completed remdesivir x5 days 1/17 - 1/21 - Steroids x10 days, hypoxemia resolved with ambulation today. - s/p tocilizumab 1/17 - Continued antibiotics given his definite aspiration x5 days - Vitamin C, zinc - Encourage OOB, IS, FV, and awake proning if able - Tylenol and antitussives prn - Continue airborne, contact precautions while admitted. Isolation period would be recommended for 21 days from positive testing. - Enoxaparin prophylactic dose.  - Maintain euvolemia/net negative.  - Avoid NSAIDs   CoNS bacteremia: No fevers, wounds, meningismus. No sedation, encephalopathy. CRP down and PCT only 0.14. Continue to suspect contaminant, though patient received tocilizumab, ongoing steroids, and a history of cutaneous infections.  - Appreciate ID consult, echocardiogram performed this morning, still pending. Repeat blood cultures from 1/20 NGTD at ~36 hours.  - Continuing vancomycin pending work up, ID rec's.    Acute toxic metabolic encephalopathy: Resolved. Strong suspicion for sedative-induced hypercarbia. CT head was negative, CTA head/neck negative for large vessel occlusion, EEG normal. B12 wnl, folate wnl, TSH slightly low with normal free T4, ammonia wnl.  ABG normalizing. - Minimize benzodiazepines/sedative medications - No further recommendations from neurology.   CoNS: Suspected contaminant, d/w ID.  - DC Vancomycin - Follow repeat cultures from 1/18 (NGTD x48 hrs)  Tobacco use: ~1 ppd reported to me  - Nicotine patch ordered, augmented dose to 56m. - Scheduled inhalers ordered - Cessation counseling provided  LFT elevation: recent liver bx on 1/11 with steatohepatitis and periportal and centrilobular fibrosis. Hepatitis panel negative.  - Continue monitoring in AM, improving.  [facility]  Aspiration, dysphagia: Likely due to covid-related weakness on chronic muscular dystrophy.  - D/w SLP will continue aspiration precautions, dysphagia diet.   History of muscular dystrophy, peripheral neuropathy:  - Continue assistance as he has available at home.  - Give flexeril low dose prn, monitor for sedation.  - Continue outpatient neurology evaluations.  Schizoaffective disorder, bipolar disorder, PTSD:  - Continue fluoxetine, lurasidone, low dose xanax. Holding haldol due to QT prolongation  Insomnia:  - Trazodone qHS increased  Obesity: BMI 37. Noted.   GERD:  - Continue pepcid  Hypokalemia: Replace and monitor.   History of alcohol use: No withdrawal or intoxication currently.   Seizure disorder: Reportedly. EEG normal.  - Continue topiramate, though this could also cause a more chronic encephalopathy.   DVT prophylaxis: Lovenox Code Status: Full Family Communication: None at bedside Disposition Plan: Home potentially in next 24 hours. Still awaiting latest blood culture results and the susceptibility data on previous two CoNS  isolates as well as echocardiogram results. He very much wishes to  leave today, though we have yet to r/o true bacteremia and endocarditis. D/w ID  Consultants:   Neurology  Infectious diseases  Procedures:   EEG: This study is within normal limits. No seizures or epileptiform discharges were seen throughout the recording.  Antimicrobials:  Remdesivir  Ceftriaxone azithromycin  Vancomycin 1/19 - 1/20   Biktarvy  Subjective: Desperately wants to go home. Feels well. No shortness of breath, chest or other pain. No fever, chills. No new wounds reported. Back pain improved with flexeril, slept well with trazodone and nausea improved with zofran dosing. No sedation noted.   Objective: Vitals:   10/31/19 1737 10/31/19 1950 10/31/19 2313 11/01/19 0418  BP:  (!) 155/93 (!) 157/93 138/89  Pulse:  70 62 72  Resp:  18 20 20   Temp:  98 F (36.7 C) 97.8 F (36.6 C) 97.7 F (36.5 C)  TempSrc:  Oral Oral Oral  SpO2: 93% 94% 96% (!) 88%  Weight:      Height:        Intake/Output Summary (Last 24 hours) at 11/01/2019 1031 Last data filed at 11/01/2019 0600 Gross per 24 hour  Intake 650 ml  Output 2000 ml  Net -1350 ml   Filed Weights   10/27/19 1234  Weight: (!) 137.4 kg   Gen: Pleasant male in no distress Pulm: Nonlabored breathing room air. Clear. CV: Regular rate and rhythm. No murmur, rub, or gallop. No JVD, no pitting dependent edema. GI: Abdomen soft, non-tender, non-distended, with normoactive bowel sounds.  Ext: Warm, no deformities Skin: No rashes, lesions or ulcers on visualized skin. Neuro: Alert and oriented. No focal neurological deficits. Psych: Judgement and insight appear fair. Mood euthymic & affect broad. Behavior is appropriate.    Data Reviewed: I have personally reviewed following labs and imaging studies  CBC: Recent Labs  Lab 10/27/19 1102 10/27/19 1102 10/28/19 0321 10/28/19 0550 10/28/19 0916 10/29/19 0140 10/31/19 0420  WBC 8.8  --  8.8   --   --  7.1 7.3  NEUTROABS 6.5  --  6.9  --   --  5.7 6.0  HGB 10.9*   < > 11.4* 11.6* 11.2* 10.1* 10.7*  HCT 33.9*   < > 35.4* 34.0* 33.0* 33.5* 35.0*  MCV 105.3*  --  108.9*  --   --  108.1* 109.4*  PLT 127*  --  156  --   --  153 180   < > = values in this interval not displayed.   Basic Metabolic Panel: Recent Labs  Lab 10/27/19 1102 10/27/19 1232 10/27/19 2055 10/27/19 2055 10/28/19 0321 10/28/19 0321 10/28/19 0550 10/28/19 0916 10/29/19 0140 10/31/19 0420 11/01/19 0028  NA   < >  --  134*   < > 136   < > 137 137 135 136 137  K   < >  --  4.0   < > 4.2   < > 4.1 4.0 3.6 3.7 3.3*  CL   < >  --  96*  --  97*  --   --   --  98 97* 97*  CO2   < >  --  23  --  24  --   --   --  27 29 30   GLUCOSE   < >  --  122*  --  138*  --   --   --  181* 196* 123*  BUN   < >  --  16  --  17  --   --   --  20 21* 24*  CREATININE   < >  --  1.15  --  1.04  --   --   --  0.81 0.83 1.04  CALCIUM   < >  --  9.3  --  9.3  --   --   --  9.3 9.4 9.6  MG  --  2.4  --   --  2.4  --   --   --  2.4  --   --   PHOS  --   --   --   --  4.9*  --   --   --  2.9  --   --    < > = values in this interval not displayed.   GFR: Estimated Creatinine Clearance: 148.2 mL/min (by C-G formula based on SCr of 1.04 mg/dL). Liver Function Tests: Recent Labs  Lab 10/27/19 2055 10/28/19 0321 10/29/19 0140 10/31/19 0420 11/01/19 0028  AST 223* 200* 146* 132* 121*  ALT 67* 69* 75* 128* 136*  ALKPHOS 81 76 72 74 83  BILITOT 1.4* 1.4* 1.0 1.3* 0.9  PROT 7.8 8.3* 7.3 6.9 6.8  ALBUMIN 2.7* 3.0* 2.8* 2.9* 2.9*   Recent Labs  Lab 10/28/19 0442  AMMONIA 17   Recent Results (from the past 240 hour(s))  Blood Culture (routine x 2)     Status: None   Collection Time: 10/27/19 11:02 AM   Specimen: BLOOD RIGHT HAND  Result Value Ref Range Status   Specimen Description   Final    BLOOD RIGHT HAND Performed at [facility], [place] [address] [place], [place] [address]    Special Requests    Final    BOTTLES DRAWN AEROBIC AND ANAEROBIC Blood Culture results may not be optimal due to an inadequate volume of blood received in culture bottles Performed at [facility] [address], [place], [place] [address]    Culture   Final    NO GROWTH 5 DAYS Performed at [facility], [place] [address] [place], [place] [address]    Report Status 11/01/2019 FINAL  Final  Blood Culture (routine x 2)     Status: Abnormal (Preliminary result)   Collection Time: 10/27/19 11:02 AM   Specimen: BLOOD  Result Value Ref Range Status   Specimen Description   Final    BLOOD LEFT ANTECUBITAL Performed at [facility] [address], [place], [place] [address]    Special Requests   Final    BOTTLES DRAWN AEROBIC AND ANAEROBIC Blood Culture adequate volume Performed at [facility] [address], [place], [place] [address]    Culture  Setup Time   Final    GRAM POSITIVE COCCI IN CLUSTERS IN BOTH AEROBIC AND ANAEROBIC BOTTLES CRITICAL RESULT CALLED TO, READ BACK BY AND VERIFIED WITH: L. CHEN, PHARMD ([place])  AT 0086 ON 10/28/19 BY C. JESSUP, MT.    Culture (A)  Final    STAPHYLOCOCCUS EPIDERMIDIS SUSCEPTIBILITIES TO FOLLOW Performed at [facility], [place] [address], [place], [place] [address]    Report Status PENDING  Incomplete  Respiratory Panel by RT PCR (Flu A&B, Covid) - Nasopharyngeal Swab     Status: Abnormal   Collection Time: 10/27/19 12:32 PM   Specimen: Nasopharyngeal Swab  Result Value Ref Range Status   SARS Coronavirus 2 by RT PCR POSITIVE (A) NEGATIVE Final    Comment: RESULT CALLED TO, READ BACK BY AND VERIFIED WITH: Q.MILNER,RN [id number] @1455   BY V.WILKINS (NOTE) SARS-CoV-2 target nucleic acids are DETECTED. SARS-CoV-2 RNA is generally detectable in upper respiratory specimens  during the acute phase of infection. Positive results are indicative of the presence of the identified virus, but do not rule  out bacterial infection or co-infection with other pathogens not detected by the test. Clinical correlation with patient history and other diagnostic information is necessary to determine patient infection status. The expected result is Negative. Fact Sheet for Patients:  PinkCheek.be Fact Sheet for Healthcare Providers: GravelBags.it This test is not yet approved or cleared by the [place] FDA and  has been authorized for detection and/or diagnosis of SARS-CoV-2 by FDA under an Emergency Use Authorization (EUA).  This EUA will remain in effect (meaning this test can be used)  for the duration of  the COVID-19 declaration under Section 564(b)(1) of the Act, 21 U.S.C. section 360bbb-3(b)(1), unless the authorization is terminated or revoked sooner.    Influenza A by PCR NEGATIVE NEGATIVE Final   Influenza B by PCR NEGATIVE NEGATIVE Final    Comment: (NOTE) The Xpert Xpress SARS-CoV-2/FLU/RSV assay is intended as an aid in  the diagnosis of influenza from Nasopharyngeal swab specimens and  should not be used as a sole basis for treatment. Nasal washings and  aspirates are unacceptable for Xpert Xpress SARS-CoV-2/FLU/RSV  testing. Fact Sheet for Patients: PinkCheek.be Fact Sheet for Healthcare Providers: GravelBags.it This test is not yet approved or cleared by the [place] FDA and  has been authorized for detection and/or diagnosis of SARS-CoV-2 by  FDA under an Emergency Use Authorization (EUA). This EUA will remain  in effect (meaning this test can be used) for the duration of the  Covid-19 declaration under Section 564(b)(1) of the Act, 21  U.S.C. section 360bbb-3(b)(1), unless the authorization is  terminated or revoked. Performed at [facility], [place] [address] [place], [place] [address]   Culture, blood (routine x 2)     Status:  None (Preliminary result)   Collection Time: 10/28/19  1:53 PM   Specimen: BLOOD  Result Value Ref Range Status   Specimen Description   Final    BLOOD RIGHT ANTECUBITAL Performed at [facility] [address] [place], [place] [address]    Special Requests   Final    BOTTLES DRAWN AEROBIC ONLY Blood Culture adequate volume Performed at [facility] [address], [place], [place] [address]    Culture   Final    NO GROWTH 4 DAYS Performed at [facility], [place] [address], [place], [place] [address]    Report Status PENDING  Incomplete  Culture, blood (routine x 2)     Status: Abnormal (Preliminary result)   Collection Time: 10/28/19  6:10 PM   Specimen: BLOOD  Result Value Ref Range Status   Specimen Description   Final    BLOOD RIGHT ANTECUBITAL Performed at [facility] [address] [place], [place] [address]    Special Requests   Final    BOTTLES DRAWN AEROBIC AND ANAEROBIC Blood Culture adequate volume Performed at [facility] [address] [place], [place] [address]    Culture  Setup Time   Final    ANAEROBIC BOTTLE ONLY GRAM POSITIVE COCCI CRITICAL RESULT CALLED TO, READ BACK BY AND VERIFIED WITH: Domenick Gong [facility] 10/30/19 1923 JDW Performed at [facility], [place] [address], [place],  [address]    Culture STAPHYLOCOCCUS EPIDERMIDIS (A)  Final  Report Status PENDING  Incomplete   Organism ID, Bacteria STAPHYLOCOCCUS EPIDERMIDIS  Final      Susceptibility   Staphylococcus epidermidis - MIC*    CIPROFLOXACIN <=0.5 SENSITIVE Sensitive     ERYTHROMYCIN >=8 RESISTANT Resistant     GENTAMICIN <=0.5 SENSITIVE Sensitive     OXACILLIN >=4 RESISTANT Resistant     TETRACYCLINE >=16 RESISTANT Resistant     VANCOMYCIN 1 SENSITIVE Sensitive     TRIMETH/SULFA <=10 SENSITIVE Sensitive     CLINDAMYCIN >=8 RESISTANT Resistant     RIFAMPIN <=0.5 SENSITIVE Sensitive     Inducible  Clindamycin NEGATIVE Sensitive     * STAPHYLOCOCCUS EPIDERMIDIS  Culture, blood (routine x 2)     Status: None (Preliminary result)   Collection Time: 10/30/19  9:45 PM   Specimen: Left Antecubital; Blood  Result Value Ref Range Status   Specimen Description   Final    LEFT ANTECUBITAL Performed at [facility] [address] [place], [place] [address]    Special Requests   Final    BOTTLES DRAWN AEROBIC AND ANAEROBIC Blood Culture adequate volume   Culture   Final    NO GROWTH 1 DAY Performed at [facility], [place] [address], [place], [place] [address]    Report Status PENDING  Incomplete  Culture, blood (routine x 2)     Status: None (Preliminary result)   Collection Time: 10/30/19  9:50 PM   Specimen: Left Antecubital; Blood  Result Value Ref Range Status   Specimen Description   Final    LEFT ANTECUBITAL Performed at [facility] [address] [place], [place] [address]    Special Requests   Final    BOTTLES DRAWN AEROBIC AND ANAEROBIC Blood Culture adequate volume   Culture   Final    NO GROWTH 1 DAY Performed at [facility], [place] [address] [place], [place] [address]    Report Status PENDING  Incomplete      Radiology Studies: No results found.  Scheduled Meds: . albuterol  2 puff Inhalation Q4H  . bictegravir-emtricitabine-tenofovir AF  1 tablet Oral Daily  . budesonide  2 puff Inhalation BID  . diclofenac Sodium  2 g Topical QID  . enoxaparin (LOVENOX) injection  70 mg Subcutaneous Q24H  . famotidine  40 mg Oral Daily  . FLUoxetine  40 mg Oral Daily  . folic acid  1 mg Oral Daily  . ipratropium  2 puff Inhalation Q6H  . loratadine  10 mg Oral Daily  . lurasidone  120 mg Oral Daily  . methylPREDNISolone (SOLU-MEDROL) injection  60 mg Intravenous Q12H  . multivitamin with minerals  1 tablet Oral Daily  . nicotine  21 mg Transdermal Daily  . potassium chloride  40 mEq Oral Once  . thiamine  100 mg Oral  Daily   Or  . thiamine  100 mg Intravenous Daily  . topiramate  75 mg Oral QHS  . traZODone  150 mg Oral QHS  . valACYclovir  1,000 mg Oral Daily   Continuous Infusions: . azithromycin 500 mg (10/31/19 1458)  . cefTRIAXone (ROCEPHIN)  IV 1 g (10/31/19 1344)  . vancomycin Stopped (10/31/19 2215)     LOS: 5 days   Time spent: 25 minutes.  Patrecia Pour, MD [facility] [email or web address] 11/01/2019, 10:31 AM

## 2019-11-01 NOTE — Progress Notes (Signed)
Occupational Therapy Treatment Patient Details Name: [name] MRN: [id number] DOB: 12/20/1985 Today's Date: 11/01/2019    History of present illness 34 y.o. male admitted on 10/27/19 for SOB.  Dx with acute hypoxic respiratory failure secondary to COVID 19 PNA and possible CAP, acute metabolic encepholopathy suspected benzos.  Pt with significant PMH of schizoaffective disorder, PTSD, seizures, peripheral neuropathy, HTN, HIV, herpes, bipolar d/o, hand surgery and back surgery.     OT comments  Pt making progress toward goals. SpO2 remains in 90s during ADL and mobility (@ 79f). Educated pt on use of AE to assist with ADL and IADL. Educated on reducing risk of falls and availability of Med Alert Program  - written information provided. Information also provided on home grocery delivery options. Will continue to follow acuteey.   Follow Up Recommendations  Supervision - Intermittent;Home health OT(HH Aide)    Equipment Recommendations  3 in 1 bedside commode;Tub/shower bench;Other (comment)    Recommendations for Other Services      Precautions / Restrictions Precautions Precautions: Fall       Mobility Bed Mobility Overal bed mobility: Modified Independent                Transfers Overall transfer level: Needs assistance Equipment used: Rolling walker (2 wheeled) Transfers: Sit to/from SOmnicareSit to Stand: Supervision Stand pivot transfers: Supervision       General transfer comment: increased ability to trasnfer from seated position without assistance    Balance Overall balance assessment: Needs assistance   Sitting balance-Leahy Scale: Good       Standing balance-Leahy Scale: Fair                             ADL either performed or assessed with clinical judgement   ADL Overall ADL's : Needs assistance/impaired             Lower Body Bathing: Set up;Sit to/from stand       Lower Body Dressing: Set up;Sit  to/from stand;With adaptive equipment             Tub/Shower Transfer Details (indicate cue type and reason): Using picture, demonstrated safe tub tranfer technique with use of tub bench Functional mobility during ADLs: Supervision/safety;Rolling walker;Cueing for safety General ADL Comments: Educated on fall prevention strategies; issued AE to assist with ADL and IADL tasks     Vision       Perception     Praxis      Cognition Arousal/Alertness: Awake/alert Behavior During Therapy: WFL for tasks assessed/performed Overall Cognitive Status: No family/caregiver present to determine baseline cognitive functioning Area of Impairment: Attention;Safety/judgement;Awareness;Memory                   Current Attention Level: Selective Memory: Decreased short-term memory Following Commands: Follows one step commands consistently Safety/Judgement: Decreased awareness of deficits;Decreased awareness of safety Awareness: Emergent   General Comments: Cognition continues to improve; unsure of baseline cognition; impaired safety awareness; ableto return demosntrate safe mobility techniques after demonstration; increased insight into need tfor possible assistance at home/call alert system        Exercises Exercises: Other exercises Other Exercises Other Exercises: grip and pinch strengthening exercises   Shoulder Instructions       General Comments      Pertinent Vitals/ Pain       Pain Assessment: No/denies pain  Home Living  Prior Functioning/Environment              Frequency  Min 3X/week        Progress Toward Goals  OT Goals(current goals can now be found in the care plan section)  Progress towards OT goals: Progressing toward goals  Acute Rehab OT Goals Patient Stated Goal: to get stronger, return to his baseline and go home.  OT Goal Formulation: With patient Time For Goal Achievement:  11/13/19 Potential to Achieve Goals: Good ADL Goals Pt Will Perform Lower Body Bathing: with modified independence;sit to/from stand;with adaptive equipment Pt Will Perform Lower Body Dressing: with modified independence;sit to/from stand;with adaptive equipment Pt Will Transfer to Toilet: with modified independence;ambulating;bedside commode Pt Will Perform Tub/Shower Transfer: Tub transfer;with supervision;ambulating;tub bench;rolling walker Pt/caregiver will Perform Home Exercise Program: Increased strength;Independently;With written HEP provided;With theraband Additional ADL Goal #1: Pt will independently verbalize 3 strategies to reduce risk of falls  Plan Discharge plan remains appropriate    Co-evaluation                 AM-PAC OT "6 Clicks" Daily Activity     Outcome Measure   Help from another person eating meals?: None Help from another person taking care of personal grooming?: A Little Help from another person toileting, which includes using toliet, bedpan, or urinal?: A Little Help from another person bathing (including washing, rinsing, drying)?: A Little Help from another person to put on and taking off regular upper body clothing?: A Little Help from another person to put on and taking off regular lower body clothing?: A Little 6 Click Score: 19    End of Session Equipment Utilized During Treatment: Rolling walker  OT Visit Diagnosis: Unsteadiness on feet (R26.81);Other abnormalities of gait and mobility (R26.89);Muscle weakness (generalized) (M62.81);Other symptoms and signs involving cognitive function;Pain   Activity Tolerance Patient tolerated treatment well   Patient Left in chair;with call bell/phone within reach;with chair alarm set   Nurse Communication Mobility status;Other (comment)(DC plan)        Time: 0920-1000 OT Time Calculation (min): 40 min  Charges: OT General Charges $OT Visit: 1 Visit OT Treatments $Self Care/Home Management :  38-52 mins  Maurie Boettcher, OT/L   Acute OT Clinical Specialist [place] Pager [phone] Office [phone]    [facility] 11/01/2019, 10:13 AM

## 2019-11-01 NOTE — Progress Notes (Signed)
[facility] for Infectious Disease    Date of Admission:  10/27/2019   Total days of antibiotics 6 (finished 5 days of remdesivir), day 4 vanco           ID: [name] is a 34 y.o. male with well controlled hiv disease, covid pneumonia with possible bacteremia Principal Problem:   Acute respiratory failure due to COVID-19 [facility]) Active Problems:   Schizoaffective disorder, depressive type ([place])   GERD (gastroesophageal reflux disease)   Hypokalemia  Subjective: Afebrile, feeling better  Medications:  . albuterol  2 puff Inhalation Q4H  . bictegravir-emtricitabine-tenofovir AF  1 tablet Oral Daily  . budesonide  2 puff Inhalation BID  . diclofenac Sodium  2 g Topical QID  . enoxaparin (LOVENOX) injection  70 mg Subcutaneous Q24H  . famotidine  40 mg Oral Daily  . FLUoxetine  40 mg Oral Daily  . folic acid  1 mg Oral Daily  . ipratropium  2 puff Inhalation Q6H  . loratadine  10 mg Oral Daily  . lurasidone  120 mg Oral Daily  . methylPREDNISolone (SOLU-MEDROL) injection  60 mg Intravenous Q12H  . multivitamin with minerals  1 tablet Oral Daily  . nicotine  21 mg Transdermal Daily  . thiamine  100 mg Oral Daily   Or  . thiamine  100 mg Intravenous Daily  . topiramate  75 mg Oral QHS  . traZODone  150 mg Oral QHS  . valACYclovir  1,000 mg Oral Daily    Objective: Vital signs in last 24 hours: Temp:  [97.7 F (36.5 C)-98 F (36.7 C)] 97.7 F (36.5 C) (01/22 0418) Pulse Rate:  [62-72] 72 (01/22 0418) Resp:  [18-20] 20 (01/22 0418) BP: (138-157)/(89-93) 138/89 (01/22 0418) SpO2:  [88 %-96 %] 88 % (01/22 0418)   No exam  Lab Results Recent Labs    10/31/19 0420 11/01/19 0028  WBC 7.3  --   HGB 10.7*  --   HCT 35.0*  --   NA 136 137  K 3.7 3.3*  CL 97* 97*  CO2 29 30  BUN 21* 24*  CREATININE 0.83 1.04   Liver Panel Recent Labs    10/31/19 0420 11/01/19 0028  PROT 6.9 6.8  ALBUMIN 2.9* 2.9*  AST 132* 121*  ALT 128* 136*  ALKPHOS 74 83    BILITOT 1.3* 0.9   C-Reactive Protein Recent Labs    10/31/19 0420 11/01/19 0028  CRP 1.1* 0.8    Micro: Staphylococcus epidermidis      MIC    CIPROFLOXACIN <=0.5 SENSI... Sensitive    CLINDAMYCIN >=8 RESISTANT  Resistant    ERYTHROMYCIN >=8 RESISTANT  Resistant    GENTAMICIN <=0.5 SENSI... Sensitive    Inducible Clindamycin NEGATIVE  Sensitive    OXACILLIN >=4 RESISTANT  Resistant    RIFAMPIN <=0.5 SENSI... Sensitive    TETRACYCLINE >=16 RESIST... Resistant    TRIMETH/SULFA <=10 SENSIT... Sensitive    VANCOMYCIN 1 SENSITIVE  Sensitive     Studies/Results: ECHOCARDIOGRAM COMPLETE  Result Date: 11/01/2019   ECHOCARDIOGRAM REPORT   Patient Name:   [name] Date of Exam: 11/01/2019 Medical Rec #:  [id number]        Height:       75.0 in Accession #:    [id number]       Weight:       302.9 lb Date of Birth:  08/23/1986        BSA:  2.62 m Patient Age:    33 years         BP:           138/89 mmHg Patient Gender: M                HR:           72 bpm. Exam Location:  Inpatient Procedure: 2D Echo Indications:    Bacteremia 790.7 / R78.81  History:        Patient has prior history of Echocardiogram examinations, most                 recent 09/03/2018. Acute respiratory failure due to COVID-19.  Sonographer:    Vikki Ports Turrentine Referring Phys: [place]  1. Left ventricular ejection fraction, by visual estimation, is 60 to 65%. The left ventricle has normal function. There is no left ventricular hypertrophy.  2. The left ventricle has no regional wall motion abnormalities.  3. Global right ventricle has normal systolic function.The right ventricular size is normal. No increase in right ventricular wall thickness.  4. Left atrial size was normal.  5. Right atrial size was normal.  6. The mitral valve is normal in structure. Trivial mitral valve regurgitation. No evidence of mitral stenosis.  7. The tricuspid valve is normal in structure.  8. The tricuspid  valve is normal in structure. Tricuspid valve regurgitation is not demonstrated.  9. The aortic valve is normal in structure. Aortic valve regurgitation is not visualized. No evidence of aortic valve sclerosis or stenosis. 10. The pulmonic valve was normal in structure. Pulmonic valve regurgitation is not visualized. 11. The inferior vena cava is dilated in size with <50% respiratory variability, suggesting right atrial pressure of 15 mmHg. 12. No evidence of valvular vegetations on this transthoracic echocardiogram. Would recommend a transesophageal echocardiogram to exclude infective endocarditis if clinically indicated. FINDINGS  Left Ventricle: Left ventricular ejection fraction, by visual estimation, is 60 to 65%. The left ventricle has normal function. The left ventricle has no regional wall motion abnormalities. There is no left ventricular hypertrophy. Normal left atrial pressure. Right Ventricle: The right ventricular size is normal. No increase in right ventricular wall thickness. Global RV systolic function is has normal systolic function. Left Atrium: Left atrial size was normal in size. Right Atrium: Right atrial size was normal in size Pericardium: There is no evidence of pericardial effusion. Mitral Valve: The mitral valve is normal in structure. Trivial mitral valve regurgitation. No evidence of mitral valve stenosis by observation. Tricuspid Valve: The tricuspid valve is normal in structure. Tricuspid valve regurgitation is not demonstrated. Aortic Valve: The aortic valve is normal in structure. Aortic valve regurgitation is not visualized. The aortic valve is structurally normal, with no evidence of sclerosis or stenosis. Pulmonic Valve: The pulmonic valve was normal in structure. Pulmonic valve regurgitation is not visualized. Pulmonic regurgitation is not visualized. Aorta: The aortic root, ascending aorta and aortic arch are all structurally normal, with no evidence of dilitation or obstruction.  Venous: The inferior vena cava is dilated in size with less than 50% respiratory variability, suggesting right atrial pressure of 15 mmHg. IAS/Shunts: No atrial level shunt detected by color flow Doppler. There is no evidence of a patent foramen ovale. No ventricular septal defect is seen or detected. There is no evidence of an atrial septal defect. Additional Comments: No evidence of valvular vegetations on this transthoracic echocardiogram. Would recommend a transesophageal echocardiogram to exclude infective endocarditis if clinically indicated.  LEFT VENTRICLE PLAX 2D LVIDd:         5.25 cm  Diastology LVIDs:         3.44 cm  LV e' lateral:   11.30 cm/s LV PW:         0.83 cm  LV E/e' lateral: 9.8 LV IVS:        0.98 cm  LV e' medial:    8.49 cm/s LVOT diam:     2.10 cm  LV E/e' medial:  13.1 LV SV:         84 ml LV SV Index:   30.46 LVOT Area:     3.46 cm  RIGHT VENTRICLE RV S prime:     12.60 cm/s TAPSE (M-mode): 2.0 cm LEFT ATRIUM             Index       RIGHT ATRIUM           Index LA diam:        4.20 cm 1.60 cm/m  RA Area:     14.40 cm LA Vol (A2C):   64.4 ml 24.60 ml/m RA Volume:   38.40 ml  14.67 ml/m LA Vol (A4C):   56.4 ml 21.54 ml/m LA Biplane Vol: 60.4 ml 23.07 ml/m  AORTIC VALVE LVOT Vmax:   102.00 cm/s LVOT Vmean:  74.100 cm/s LVOT VTI:    0.234 m  AORTA Ao Root diam: 3.10 cm MITRAL VALVE MV Area (PHT): 4.39 cm              SHUNTS MV PHT:        50.17 msec            Systemic VTI:  0.23 m MV Decel Time: 173 msec              Systemic Diam: 2.10 cm MV E velocity: 111.00 cm/s 103 cm/s MV A velocity: 68.40 cm/s  70.3 cm/s MV E/A ratio:  1.62        1.5  Candee Furbish MD Electronically signed by Candee Furbish MD Signature Date/Time: 11/01/2019/11:31:47 AM    Final      Assessment/Plan: Staph epi bacteremia = initially felt to be contaminant however, he had repeat blood cx with 1/4 blood cx still showing staph epi. He is reported to be improved. Now on day 4 of vancomycin. Can finish out the 7  day course of abtx with bactrim ds 1 bid.   Pneumonia, covid = has completed his course of remdesivir  Questionable aspiration/cap = would complete his ceftriaxone/azithromycin today  hiv disease = continue on his home regimen of biktarvy daily  [facility] Cell: [phone] Pager: [phone]  11/01/2019, 5:57 PM

## 2019-11-01 NOTE — Progress Notes (Signed)
  Echocardiogram 2D Echocardiogram has been performed.  Sadat Sliwa A Caroleann Casler 11/01/2019, 8:16 AM

## 2019-11-01 NOTE — Care Management Important Message (Signed)
Important Message  Patient Details  Name: RUSTYN CONERY MRN: [id number] Date of Birth: 03-22-1986   Medicare Important Message Given:  Yes - Important Message mailed due to current National Emergency   Verbal consent obtained due to current National Emergency  Relationship to patient: Mother Contact Name: [place] Call Date: 11/01/19  Time: 1642 Phone: [phone], [phone] Outcome: No Answer/Busy Important Message mailed to: Patient address on file      Tommy Medal 11/01/2019, 4:42 PM

## 2019-11-01 NOTE — TOC Initial Note (Signed)
Transition of Care [facility]) - Initial/Assessment Note    Patient Details  Name: [name] MRN: [id number] Date of Birth: May 20, 1986  Transition of Care [facility]) CM/SW Contact:    Shade Flood, LCSW Phone Number: 11/01/2019, 3:03 PM  Clinical Narrative:                  Pt admitted from home. PT recommending [facility]. Spoke with pt by phone today to discuss. Per pt, he lives alone and feels safe returning home. He has personal care aides from [facility] 2.5 hours seven days a week. Discussed HH and pt agreeable. Pt will also need DME at dc, walker, BSC and tub bench. [place] arranged with [facility].  Anticipating dc tomorrow. Will ask weekend TOC to make sure walker delivered to pt's room before dc. Commode (Bariatric) and tub bench will be delivered to pt's home. Pt aware.   TOC will follow.  Expected Discharge Plan: [place] Barriers to Discharge: Continued Medical Work up   Patient Goals and CMS Choice        Expected Discharge Plan and Services Expected Discharge Plan: [place] In-house Referral: Clinical Social Work   Post Acute Care Choice: Durable Medical Equipment, Home Health Living arrangements for the past 2 months: Apartment Expected Discharge Date: 11/01/19               DME Arranged: 3-N-1, Walker rolling, Tub bench DME Agency: [employer] Date DME Agency Contacted: 11/01/19   Representative spoke with at DME Agency: Thedore Mins HH Arranged: PT, OT, Social Work [employer] Agency: [place] Date [place]: 11/01/19   Representative spoke with at [place]: Georgina Snell  Prior Living Arrangements/Services Living arrangements for the past 2 months: Apartment Lives with:: Self Patient language and need for interpreter reviewed:: Yes Do you feel safe going back to the place where you live?: Yes      Need for Family Participation in Patient Care: No (Comment) Care giver support system in place?: Yes (comment)   Criminal  Activity/Legal Involvement Pertinent to Current Situation/Hospitalization: No - Comment as needed  Activities of Daily Living Home Assistive Devices/Equipment: None ADL Screening (condition at time of admission) Patient's cognitive ability adequate to safely complete daily activities?: Yes Is the patient deaf or have difficulty hearing?: No Does the patient have difficulty seeing, even when wearing glasses/contacts?: Yes Does the patient have difficulty concentrating, remembering, or making decisions?: Yes Patient able to express need for assistance with ADLs?: Yes Does the patient have difficulty dressing or bathing?: No Independently performs ADLs?: Yes (appropriate for developmental age) Communication: Independent Dressing (OT): Independent Grooming: Independent Feeding: Independent Bathing: Independent Toileting: Needs assistance Is this a change from baseline?: Change from baseline, expected to last <3 days In/Out Bed: Needs assistance Does the patient have difficulty walking or climbing stairs?: No Weakness of Legs: Both Weakness of Arms/Hands: Both  Permission Sought/Granted Permission sought to share information with : Chartered certified accountant granted to share information with : Yes, Verbal Permission Granted     Permission granted to share info w AGENCY: [facility]        Emotional Assessment   Attitude/Demeanor/Rapport: Engaged Affect (typically observed): Pleasant Orientation: : Oriented to Self, Oriented to Place, Oriented to  Time, Oriented to Situation Alcohol / Substance Use: Not Applicable Psych Involvement: No (comment)  Admission diagnosis:  Shortness of breath [R06.02] Hypoxia [R09.02] Acute respiratory failure due to COVID-19 ([place]) [U07.1, J96.00] Suspected COVID-19 virus infection [  Z20.822] Patient Active Problem List   Diagnosis Date Noted  . Acute respiratory failure due to COVID-19 ([place]) 10/27/2019  . GERD (gastroesophageal reflux  disease) 10/27/2019  . Hypokalemia 10/27/2019  . Peripheral neuropathy 10/01/2019  . PTSD (post-traumatic stress disorder) 07/23/2018  . Dizziness and giddiness 02/01/2016  . Migraine headache 02/01/2016  . Schizoaffective disorder, depressive type ([place]) 05/13/2015  . Severe alcohol dependence ([place]) 05/13/2015  . Suicidal ideation 01/12/2014   PCP:  [name], MD Pharmacy:   [place], [place] - [address] [place] [address] Phone: [phone] Fax: [phone]     Social Determinants of Health (SDOH) Interventions    Readmission Risk Interventions Readmission Risk Prevention Plan 11/01/2019  Home Care Screening Complete  Medication Review (RN CM) Complete  Some recent data might be hidden

## 2019-11-02 LAB — GLUCOSE, CAPILLARY
Glucose-Capillary: 137 mg/dL — ABNORMAL HIGH (ref 70–99)
Glucose-Capillary: 87 mg/dL (ref 70–99)

## 2019-11-02 LAB — CULTURE, BLOOD (ROUTINE X 2)
Culture: NO GROWTH
Special Requests: ADEQUATE
Special Requests: ADEQUATE

## 2019-11-02 LAB — VANCOMYCIN, PEAK: Vancomycin Pk: 68 ug/mL (ref 30–40)

## 2019-11-02 MED ORDER — SULFAMETHOXAZOLE-TRIMETHOPRIM 800-160 MG PO TABS
1.0000 | ORAL_TABLET | Freq: Two times a day (BID) | ORAL | Status: DC
  Administered 2019-11-02: 1 via ORAL
  Filled 2019-11-02 (×2): qty 1

## 2019-11-02 MED ORDER — SULFAMETHOXAZOLE-TRIMETHOPRIM 800-160 MG PO TABS: 1.0000 | ORAL_TABLET | Freq: Two times a day (BID) | ORAL | 0 refills | Status: DC

## 2019-11-02 MED ORDER — SULFAMETHOXAZOLE-TRIMETHOPRIM 800-160 MG PO TABS
1.0000 | ORAL_TABLET | Freq: Two times a day (BID) | ORAL | Status: DC
  Filled 2019-11-02: qty 1

## 2019-11-02 NOTE — Discharge Summary (Signed)
Physician Discharge Summary  [name] JOI:[id number] DOB: Jun 24, 1986 DOA: 10/27/2019  PCP: [name], MD  Admit date: 10/27/2019 Discharge date: 11/02/2019  Admitted From: Home Disposition: Home   Recommendations for Outpatient Follow-up:  1. Follow up with PCP in 1-2 weeks 2. Please obtain BMP/CBC in one week 3. Please follow up on the following pending results: Blood cultures 10/30/2019  Home Health: PT, OT, CSW Equipment/Devices: 3 in 1, rolling walker, tub bench Discharge Condition: Stable CODE STATUS: Full Diet recommendation: Heart healthy  Brief/Interim Summary: [name] is a 34 y.o. male with a history of muscular dystrophy, peripheral neuropathy, bipolar disorder, PTSD, well-controlled HIV disease on biktarvy, G6PD deficiency and HTN who presented 1/17 with cough, fever, dyspnea found to be febrile and hypoxic with elevated inflammatory markers and CXR demonstrating patchy infiltrate and positive SARS-CoV-2 testing. Remdesivir, steroids, and empiric ceftriaxone and azithromycin were given and blood cultures were drawn. With treatment hypoxemia resolved, inflammatory markers normalized, and the patient's functional mobility improved. Blood cultures from admission grew S. epidermidis in 1 of 2 collections initially felt to be contaminant however, he had repeat blood cx with 1/4 blood cx still showing staph epi. Echocardiogram showed no vegetations and susceptibility testing on different isolates yield different resistant patterns more consistent with contaminant. Infectious disease was consulted and recommended finishing 7-day course of antibiotics (vancomycin started for 4 days) with bactrim. This has been prescribed and the patient discharged in stable condition.   Discharge Diagnoses:  Principal Problem:   Acute respiratory failure due to COVID-19 [facility]) Active Problems:   Schizoaffective disorder, depressive type ([place])   GERD (gastroesophageal reflux disease)    Hypokalemia  Acute hypoxemic and hypercarbic respiratory failure due to [id number] pneumonia complicated by aspiration pneumonia: SARS-CoV-2 PCR but not Ag on 1/17. CRP declining as expected.  - Completed remdesivir x5 days 1/17 - 1/21 - Steroids given x7 days with durable resolution of hypoxemia will not continue at discharge.  - s/p tocilizumab 1/17 - Continued antibiotics given aspiration x5 days - Isolation period would be recommended for 21 days from positive testing.  Acute toxic metabolic encephalopathy: Resolved. Strong suspicion for sedative-induced hypercarbia. CT head was negative, CTA head/neck negative for large vessel occlusion, EEG normal.B12 wnl, folate wnl, TSH slightly low with normal free T4, ammonia wnl. ABG normalizing. - Minimize benzodiazepines/sedative medications - No further recommendations from neurology.   S. epidermidis bacteremias: On 2 different isolates on 1/17 and 1/18 with different susceptibility profiles. With clinical improvement/stability, this was suspected to be contamination. ID was consulted, echocardiogram did not show vegetation. Repeat blood culture from 1/20 NGTD. CRP is normal, no fever, no skin infections noted.  - Ok to convert vancomycin to bactrim per ID and complete 7 days Tx. Follow up blood cultures final data after discharge. TMP-SMX has been tolerated in this patient without evidence of hemolysis in the past.   Tobacco use: ~1 ppd. Nicotine patch ordered while admitted - Cessation counseling provided  LFT elevation: recent liver bx on 1/11 with steatohepatitis and periportal and centrilobular fibrosis. Hepatitis panel negative. This is stable. - Continue monitoring at follow up.   HIV: Followed at [facility]  Aspiration, dysphagia: Likely due to covid-related weakness on chronic muscular dystrophy.  - D/w SLP will continue aspiration precautions, no dysphagia diet recommended at time of discharge.   History of  muscular dystrophy, peripheral neuropathy:  - Continue assistance as he has available at home.  - Continue outpatient neurology evaluations.  Schizoaffective  disorder, bipolar disorder, PTSD:  - Continue home medications, monitor QT interval.  Insomnia:  - Continue management per PCP  Obesity: BMI 37. Noted.   GERD:  - Continue pepcid  Hypokalemia: Replaced, recheck at follow up.  History of alcohol use: No withdrawal or intoxication currently.   Seizure disorder: Reportedly. EEG normal.  - Continue topiramate, though this could also cause a more chronic encephalopathy.   Discharge Instructions Discharge Instructions    Diet - low sodium heart healthy   Complete by: As directed    Discharge instructions   Complete by: As directed    You are being discharged from the hospital after treatment for covid-19 infection. You are felt to be stable enough to no longer require inpatient monitoring, testing, and treatment, though you will need to follow the recommendations below: - Continue taking bactrim twice daily for 3 more days, sent to your pharmacy - Per CDC guidelines, you will need to remain in isolation for 21 days from your first positive covid test. - Do not take NSAID medications (including, but not limited to, ibuprofen, advil, motrin, naproxen, aleve, goody's powder, etc.) - Follow up with your doctor in the next week via telehealth or seek medical attention right away if your symptoms get WORSE.  - Consider donating plasma after you have recovered (either 14 days after a negative test or 28 days after symptoms have completely resolved) because your antibodies to this virus may be helpful to give to others with life-threatening infections. Please go to the website [email or web address] if you would like to consider volunteering for plasma donation.    Directions for you at home:  Wear a facemask You should wear a facemask that covers your nose and mouth when you are in the  same room with other people and when you visit a healthcare provider. People who live with or visit you should also wear a facemask while they are in the same room with you.  Separate yourself from other people in your home As much as possible, you should stay in a different room from other people in your home. Also, you should use a separate bathroom, if available.  Avoid sharing household items You should not share dishes, drinking glasses, cups, eating utensils, towels, bedding, or other items with other people in your home. After using these items, you should wash them thoroughly with soap and water.  Cover your coughs and sneezes Cover your mouth and nose with a tissue when you cough or sneeze, or you can cough or sneeze into your sleeve. Throw used tissues in a lined trash can, and immediately wash your hands with soap and water for at least 20 seconds or use an alcohol-based hand rub.  Wash your [facility] your hands often and thoroughly with soap and water for at least 20 seconds. You can use an alcohol-based hand sanitizer if soap and water are not available and if your hands are not visibly dirty. Avoid touching your eyes, nose, and mouth with unwashed hands.  Directions for those who live with, or provide care at home for you:  Limit the number of people who have contact with the patient If possible, have only one caregiver for the patient. Other household members should stay in another home or place of residence. If this is not possible, they should stay in another room, or be separated from the patient as much as possible. Use a separate bathroom, if available. Restrict visitors who do not have an essential need to  be in the home.  Ensure good ventilation Make sure that shared spaces in the home have good air flow, such as from an air conditioner or an opened window, weather permitting.  Wash your hands often Wash your hands often and thoroughly with soap and water for  at least 20 seconds. You can use an alcohol based hand sanitizer if soap and water are not available and if your hands are not visibly dirty. Avoid touching your eyes, nose, and mouth with unwashed hands. Use disposable paper towels to dry your hands. If not available, use dedicated cloth towels and replace them when they become wet.  Wear a facemask and gloves Wear a disposable facemask at all times in the room and gloves when you touch or have contact with the patient's blood, body fluids, and/or secretions or excretions, such as sweat, saliva, sputum, nasal mucus, vomit, urine, or feces.  Ensure the mask fits over your nose and mouth tightly, and do not touch it during use. Throw out disposable facemasks and gloves after using them. Do not reuse. Wash your hands immediately after removing your facemask and gloves. If your personal clothing becomes contaminated, carefully remove clothing and launder. Wash your hands after handling contaminated clothing. Place all used disposable facemasks, gloves, and other waste in a lined container before disposing them with other household waste. Remove gloves and wash your hands immediately after handling these items.  Do not share dishes, glasses, or other household items with the patient Avoid sharing household items. You should not share dishes, drinking glasses, cups, eating utensils, towels, bedding, or other items with a patient who is confirmed to have, or being evaluated for, COVID-19 infection. After the person uses these items, you should wash them thoroughly with soap and water.  Wash laundry thoroughly Immediately remove and wash clothes or bedding that have blood, body fluids, and/or secretions or excretions, such as sweat, saliva, sputum, nasal mucus, vomit, urine, or feces, on them. Wear gloves when handling laundry from the patient. Read and follow directions on labels of laundry or clothing items and detergent. In general, wash and dry with  the warmest temperatures recommended on the label.  Clean all areas the individual has used often Clean all touchable surfaces, such as counters, tabletops, doorknobs, bathroom fixtures, toilets, phones, keyboards, tablets, and bedside tables, every day. Also, clean any surfaces that may have blood, body fluids, and/or secretions or excretions on them. Wear gloves when cleaning surfaces the patient has come in contact with. Use a diluted bleach solution (e.g., dilute bleach with 1 part bleach and 10 parts water) or a household disinfectant with a label that says EPA-registered for coronaviruses. To make a bleach solution at home, add 1 tablespoon of bleach to 1 quart (4 cups) of water. For a larger supply, add  cup of bleach to 1 gallon (16 cups) of water. Read labels of cleaning products and follow recommendations provided on product labels. Labels contain instructions for safe and effective use of the cleaning product including precautions you should take when applying the product, such as wearing gloves or eye protection and making sure you have good ventilation during use of the product. Remove gloves and wash hands immediately after cleaning.  Monitor yourself for signs and symptoms of illness Caregivers and household members are considered close contacts, should monitor their health, and will be asked to limit movement outside of the home to the extent possible. Follow the monitoring steps for close contacts listed on the symptom monitoring form.  If you have additional questions, contact your local health department or call the epidemiologist on call at [phone] (available 24/7). This guidance is subject to change. For the most up-to-date guidance from [facility], please refer to their website: [email or web address]   Increase activity slowly   Complete by: As directed    MyChart COVID-19 home monitoring program   Complete by: Nov 02, 2019    Is the patient willing to use the [place] for home monitoring?: Yes     Allergies as of 11/02/2019      Reactions   Dapsone Other (See Comments)   Per centricity "G6PD deficient"   Primaquine Phosphate Other (See Comments)   Per Centricity "G6PD deficient"      Medication List    TAKE these medications   albuterol 108 (90 Base) MCG/ACT inhaler Commonly known as: VENTOLIN HFA Inhale 2 puffs into the lungs every 6 (six) hours as needed for wheezing or shortness of breath.   alprazolam 2 MG tablet Commonly known as: XANAX Take 2 mg by mouth 3 (three) times daily.   benzonatate 100 MG capsule Commonly known as: TESSALON Take 200 mg by mouth every 8 (eight) hours as needed for cough.   bictegravir-emtricitabine-tenofovir AF 50-200-25 MG Tabs tablet Commonly known as: BIKTARVY Take 1 tablet by mouth daily.   cetirizine 10 MG tablet Commonly known as: ZYRTEC Take 1 tablet (10 mg total) by mouth daily.   famotidine 40 MG tablet Commonly known as: PEPCID Take 40 mg by mouth daily.   FLUoxetine 40 MG capsule Commonly known as: PROZAC Take 40 mg by mouth daily.   folic acid 1 MG tablet Commonly known as: FOLVITE Take 1 mg by mouth daily.   haloperidol 5 MG tablet Commonly known as: HALDOL Take 5 mg by mouth 2 (two) times daily.   hydrOXYzine 50 MG tablet Commonly known as: ATARAX/VISTARIL Take 1 tablet (50 mg total) by mouth every 6 (six) hours as needed for anxiety.   Linzess 145 MCG Caps capsule Generic drug: linaclotide Take 145 mcg by mouth daily as needed (constipation).   Lurasidone HCl 120 MG Tabs Take 1 tablet (120 mg total) by mouth daily.   ondansetron 8 MG tablet Commonly known as: ZOFRAN Take 8 mg by mouth every 8 (eight) hours as needed for nausea/vomiting.   sulfamethoxazole-trimethoprim 800-160 MG tablet Commonly known as: BACTRIM DS Take 1 tablet by mouth every 12 (twelve) hours.   thiamine 100 MG tablet Commonly known as:  VITAMIN B-1 Take 100 mg by mouth daily.   topiramate 100 MG tablet Commonly known as: TOPAMAX Take 1 tablet (100 mg total) by mouth at bedtime. What changed: how much to take   trazodone 300 MG tablet Commonly known as: DESYREL Take 1 tablet (300 mg total) by mouth at bedtime as needed for sleep. What changed: when to take this   valACYclovir 1000 MG tablet Commonly known as: VALTREX Take 1 tablet (1,000 mg total) by mouth daily.   Vitamin D (Ergocalciferol) 1.25 MG (50000 UNIT) Caps capsule Commonly known as: DRISDOL Take 50,000 Units by mouth once a week.   zolpidem 10 MG tablet Commonly known as: AMBIEN Take 10 mg by mouth at bedtime.            Durable Medical Equipment  (From admission, onward)         Start     Ordered   11/01/19 1551  For home use only DME 3 n 1  Once  Comments: Bariatric   11/01/19 1550   11/01/19 1040  For home use only DME Tub bench  Once     11/01/19 1039   11/01/19 1040  For home use only DME Walker rolling  Once    Question Answer Comment  Walker: With 5 Inch Wheels   Patient needs a walker to treat with the following condition Gait instability   Patient needs a walker to treat with the following condition Muscular dystrophy ([place])      11/01/19 1039         Follow-up Information    [name], MD. Schedule an appointment as soon as possible for a visit in 1 week(s).   Specialty: Internal Medicine Contact information: [place] [address] [phone]          Allergies  Allergen Reactions  . Dapsone Other (See Comments)    Per centricity "G6PD deficient"  . Primaquine Phosphate Other (See Comments)    Per Centricity "G6PD deficient"    Consultations:  Neurology  Infectious disease  Procedures/Studies: EEG  Result Date: 10/28/2019 Lora Havens, MD     10/28/2019  9:57 AM Patient Name: [name] MRN: [id number] Epilepsy Attending: Lora Havens Referring  Physician/Provider: Dr Amie [place] Date: 10/28/2019 Duration: 22.26 mins Patient history: 34 year old male with history of HIV, now admitted with COVID-19 infection with sudden onset of altered mental status and possible left-sided weakness.  CTA did not show any acute infarct.  EEG to evaluate for seizures. Level of alertness: Awake, sleep AEDs during EEG study: Topiramate Technical aspects: This EEG study was done with scalp electrodes positioned according to the 10-20 International system of electrode placement. Electrical activity was acquired at a sampling rate of 500Hz  and reviewed with a high frequency filter of 70Hz  and a low frequency filter of 1Hz . EEG data were recorded continuously and digitally stored. Description: During awake state, no clear posterior dominant rhythm was seen.  Sleep was characterized by vertex waves, sleep spindles (12 to 14 Hz), maximal frontocentral.  EEG also showed mixed 10 to 15 Hz alpha-beta frequencies distributed symmetrically and diffusely.  Hyperventilation and photic stimulation were not performed. IMPRESSION: This study is within normal limits. No seizures or epileptiform discharges were seen throughout the recording. Lora Havens   CT Code Stroke CTA Head W/WO contrast  Result Date: 10/28/2019 CLINICAL DATA:  Follow-up examination for acute stroke. EXAM: CT ANGIOGRAPHY HEAD AND NECK TECHNIQUE: Multidetector CT imaging of the head and neck was performed using the standard protocol during bolus administration of intravenous contrast. Multiplanar CT image reconstructions and MIPs were obtained to evaluate the vascular anatomy. Carotid stenosis measurements (when applicable) are obtained utilizing NASCET criteria, using the distal internal carotid diameter as the denominator. CONTRAST:  11m OMNIPAQUE IOHEXOL 350 MG/ML SOLN COMPARISON:  Prior head CT from earlier same day. FINDINGS: CTA NECK FINDINGS Aortic arch: Examination technically limited by motion artifact.  Visualized aortic arch of normal caliber with normal branch pattern. No hemodynamically significant stenosis seen about the origin of the great vessels. Visualized subclavian arteries widely patent. Right carotid system: Right common and internal carotid arteries grossly patent without stenosis, dissection, or occlusion. Left carotid system: Left common and internal carotid arteries grossly patent without stenosis, dissection or occlusion. Vertebral arteries: Both vertebral arteries arise from the subclavian arteries. Right vertebral artery dominant. Vertebral arteries patent within the neck without stenosis, dissection or occlusion. Skeleton: No acute osseous abnormality. No discrete osseous lesions. Degenerative spondylosis noted at  C5-6 without significant stenosis. Other neck: No other acute soft tissue abnormality within the neck. Upper chest: Extensive multifocal airspace opacities noted within the visualized lungs, likely related history of COVID infection. Visualized upper chest demonstrates no other acute finding. Review of the MIP images confirms the above findings CTA HEAD FINDINGS Anterior circulation: Internal carotid arteries patent to the termini without stenosis or other abnormality. A1 segments patent bilaterally. Normal anterior communicating artery. Anterior cerebral arteries grossly patent to their distal aspects without stenosis. No M1 stenosis or occlusion. Negative MCA bifurcations. Distal MCA branches grossly perfused and symmetric. Posterior circulation: Vertebral arteries patent to the vertebrobasilar junction without stenosis. Posteroinferior cerebral arteries patent bilaterally. Basilar patent to its distal aspect without appreciable stenosis. Superior cerebral arteries patent bilaterally. Right PCA primarily supplied via the basilar. Left PCA supplied via a hypoplastic left P1 segment as well as a robust left posterior communicating artery. Both PCAs widely patent to their distal  aspects. Venous sinuses: Patent. Anatomic variants: None significant. Review of the MIP images confirms the above findings IMPRESSION: 1. Negative CTA for large vessel occlusion. No significant atheromatous change about the major arterial vasculature of the head and neck. No hemodynamically significant stenosis or other acute abnormality. 2. Extensive airspace disease throughout the visualized lungs, consistent with history of COVID pneumonia. Results were discussed by telephone on 10/28/2019 at 10:30 p.m. with providerASHISH ARORA. Electronically Signed   By: Jeannine Boga M.D.   On: 10/28/2019 00:40   CT Code Stroke CTA Neck W/WO contrast  Result Date: 10/28/2019 CLINICAL DATA:  Follow-up examination for acute stroke. EXAM: CT ANGIOGRAPHY HEAD AND NECK TECHNIQUE: Multidetector CT imaging of the head and neck was performed using the standard protocol during bolus administration of intravenous contrast. Multiplanar CT image reconstructions and MIPs were obtained to evaluate the vascular anatomy. Carotid stenosis measurements (when applicable) are obtained utilizing NASCET criteria, using the distal internal carotid diameter as the denominator. CONTRAST:  124m OMNIPAQUE IOHEXOL 350 MG/ML SOLN COMPARISON:  Prior head CT from earlier same day. FINDINGS: CTA NECK FINDINGS Aortic arch: Examination technically limited by motion artifact. Visualized aortic arch of normal caliber with normal branch pattern. No hemodynamically significant stenosis seen about the origin of the great vessels. Visualized subclavian arteries widely patent. Right carotid system: Right common and internal carotid arteries grossly patent without stenosis, dissection, or occlusion. Left carotid system: Left common and internal carotid arteries grossly patent without stenosis, dissection or occlusion. Vertebral arteries: Both vertebral arteries arise from the subclavian arteries. Right vertebral artery dominant. Vertebral arteries patent  within the neck without stenosis, dissection or occlusion. Skeleton: No acute osseous abnormality. No discrete osseous lesions. Degenerative spondylosis noted at C5-6 without significant stenosis. Other neck: No other acute soft tissue abnormality within the neck. Upper chest: Extensive multifocal airspace opacities noted within the visualized lungs, likely related history of COVID infection. Visualized upper chest demonstrates no other acute finding. Review of the MIP images confirms the above findings CTA HEAD FINDINGS Anterior circulation: Internal carotid arteries patent to the termini without stenosis or other abnormality. A1 segments patent bilaterally. Normal anterior communicating artery. Anterior cerebral arteries grossly patent to their distal aspects without stenosis. No M1 stenosis or occlusion. Negative MCA bifurcations. Distal MCA branches grossly perfused and symmetric. Posterior circulation: Vertebral arteries patent to the vertebrobasilar junction without stenosis. Posteroinferior cerebral arteries patent bilaterally. Basilar patent to its distal aspect without appreciable stenosis. Superior cerebral arteries patent bilaterally. Right PCA primarily supplied via the basilar. Left PCA supplied via a  hypoplastic left P1 segment as well as a robust left posterior communicating artery. Both PCAs widely patent to their distal aspects. Venous sinuses: Patent. Anatomic variants: None significant. Review of the MIP images confirms the above findings IMPRESSION: 1. Negative CTA for large vessel occlusion. No significant atheromatous change about the major arterial vasculature of the head and neck. No hemodynamically significant stenosis or other acute abnormality. 2. Extensive airspace disease throughout the visualized lungs, consistent with history of COVID pneumonia. Results were discussed by telephone on 10/28/2019 at 10:30 p.m. with providerASHISH ARORA. Electronically Signed   By: Jeannine Boga  M.D.   On: 10/28/2019 00:40   DG Chest Port 1 View  Result Date: 10/27/2019 CLINICAL DATA:  Shortness of breath. EXAM: PORTABLE CHEST 1 VIEW COMPARISON:  October 15, 2014. FINDINGS: Stable cardiomediastinal silhouette. No pneumothorax is noted. Multiple ill-defined airspace opacities are noted bilaterally consistent with multifocal pneumonia. Bony thorax is unremarkable. IMPRESSION: Bilateral multifocal pneumonia. Electronically Signed   By: Marijo Conception M.D.   On: 10/27/2019 12:04   DG Swallowing Func-Speech Pathology  Result Date: 10/30/2019 Objective Swallowing Evaluation: Type of Study: MBS-Modified Barium Swallow Study  Patient Details Name: [name] MRN: [id number] Date of Birth: 1985/12/26 Today's Date: 10/30/2019 Time: SLP Start Time (ACUTE ONLY): 0915 -SLP Stop Time (ACUTE ONLY): 0935 SLP Time Calculation (min) (ACUTE ONLY): 20 min Past Medical History: Past Medical History: Diagnosis Date . Bipolar 1 disorder ([place])  . Depression  . Dizziness and giddiness 02/01/2016 . Herpes genitalia  . HIV disease ([place])  . Hypertension  . Migraine headache 02/01/2016 . Peripheral neuropathy 10/01/2019 . PTSD (post-traumatic stress disorder)  . Schizoaffective disorder ([place])  . Seizures ([place])  Past Surgical History: Past Surgical History: Procedure Laterality Date . BACK SURGERY   . HAND SURGERY   HPI: Patient is a 34 year old male with history of controlled HIV on medications, bipolar disorder, schizophrenia, seizures presented to ED with worsening shortness of breath and wheezing for the last 3 days.  Patient is diffusely wheezing at the time of my examination, hypoxic on 5 L O2. Consistent s/s aspiration x 2 days and MBS recommended.   No data recorded Assessment / Plan / Recommendation CHL IP CLINICAL IMPRESSIONS 10/30/2019 Clinical Impression Pt exhibited functional oral phase however pharyngeal swallow sequence revealed aspiration with thin barium transiently. Pt affirms coughing during meals prior to  admission and etiology of dysphagia is unclear but thought to be chronic and exacerbated with Covid.  MD suspected COPD which could lead to decreased periods of apnea during swallows and aspiration. Barium reached his pyriform sinuses and swallow was not initiated immediately and reached vocal cord level with thin resultig in immediate cough to clear. Decreased oral coordination with pill with barium spilling and filling pyriform sinsues falling onto and below vocal cords. Immediate strong, reflexive cough ejected partial barium. Subsequent cues for smaller sips with straw were better coordinated and did not enter airway. Recommend he continue regular texture, thin, SMALL sips thin via cup or straw, pills WHOLE in APPLESAUCE, intermittent throat clear and do not consume liquid with solids in oral cavity. Brief follpw up for strategies.    SLP Visit Diagnosis Dysphagia, pharyngeal phase (R13.13) Attention and concentration deficit following -- Frontal lobe and executive function deficit following -- Impact on safety and function Mild aspiration risk;Moderate aspiration risk   CHL IP TREATMENT RECOMMENDATION 10/30/2019 Treatment Recommendations Therapy as outlined in treatment plan below   Prognosis 10/30/2019 Prognosis for Safe Diet Advancement Good Barriers  to Reach Goals (No Data) Barriers/Prognosis Comment -- CHL IP DIET RECOMMENDATION 10/30/2019 SLP Diet Recommendations Regular solids;Thin liquid Liquid Administration via Cup;Straw Medication Administration Whole meds with puree Compensations Slow rate;Small sips/bites Postural Changes Seated upright at 90 degrees   CHL IP OTHER RECOMMENDATIONS 10/30/2019 Recommended Consults -- Oral Care Recommendations Oral care BID Other Recommendations --   CHL IP FOLLOW UP RECOMMENDATIONS 10/30/2019 Follow up Recommendations None   CHL IP FREQUENCY AND DURATION 10/30/2019 Speech Therapy Frequency (ACUTE ONLY) min 1 x/week Treatment Duration 1 week      CHL IP ORAL PHASE 10/30/2019  Oral Phase WFL Oral - Pudding Teaspoon -- Oral - Pudding Cup -- Oral - Honey Teaspoon -- Oral - Honey Cup -- Oral - Nectar Teaspoon -- Oral - Nectar Cup -- Oral - Nectar Straw -- Oral - Thin Teaspoon -- Oral - Thin Cup -- Oral - Thin Straw -- Oral - Puree -- Oral - Mech Soft -- Oral - Regular -- Oral - Multi-Consistency -- Oral - Pill -- Oral Phase - Comment --  CHL IP PHARYNGEAL PHASE 10/30/2019 Pharyngeal Phase Impaired Pharyngeal- Pudding Teaspoon -- Pharyngeal -- Pharyngeal- Pudding Cup -- Pharyngeal -- Pharyngeal- Honey Teaspoon -- Pharyngeal -- Pharyngeal- Honey Cup -- Pharyngeal -- Pharyngeal- Nectar Teaspoon -- Pharyngeal -- Pharyngeal- Nectar Cup -- Pharyngeal -- Pharyngeal- Nectar Straw -- Pharyngeal -- Pharyngeal- Thin Teaspoon -- Pharyngeal -- Pharyngeal- Thin Cup Delayed swallow initiation-pyriform sinuses;Penetration/Aspiration during swallow Pharyngeal Material enters airway, CONTACTS cords and not ejected out;Material enters airway, passes BELOW cords then ejected out;Material enters airway, passes BELOW cords and not ejected out despite cough attempt by patient Pharyngeal- Thin Straw Delayed swallow initiation-pyriform sinuses Pharyngeal Material does not enter airway Pharyngeal- Puree -- Pharyngeal -- Pharyngeal- Mechanical Soft -- Pharyngeal -- Pharyngeal- Regular WFL Pharyngeal -- Pharyngeal- Multi-consistency -- Pharyngeal -- Pharyngeal- Pill WFL Pharyngeal -- Pharyngeal Comment --  CHL IP CERVICAL ESOPHAGEAL PHASE 10/30/2019 Cervical Esophageal Phase (No Data) Pudding Teaspoon -- Pudding Cup -- Honey Teaspoon -- Honey Cup -- Nectar Teaspoon -- Nectar Cup -- Nectar Straw -- Thin Teaspoon -- Thin Cup -- Thin Straw -- Puree -- Mechanical Soft -- Regular -- Multi-consistency -- Pill -- Cervical Esophageal Comment -- [name] 10/30/2019, 10:47 AM [phone]             ECHOCARDIOGRAM COMPLETE  Result Date: 11/01/2019   ECHOCARDIOGRAM REPORT   Patient Name:   [name] Date of  Exam: 11/01/2019 Medical Rec #:  [id number]        Height:       75.0 in Accession #:    [id number]       Weight:       302.9 lb Date of Birth:  [birth year]        BSA:          2.62 m Patient Age:    34 years         BP:           138/89 mmHg Patient Gender: M                HR:           72 bpm. Exam Location:  Inpatient Procedure: 2D Echo Indications:    Bacteremia 790.7 / R78.81  History:        Patient has prior history of Echocardiogram examinations, most                 recent 09/03/2018. Acute respiratory failure  due to COVID-19.  Sonographer:    Vikki Ports Turrentine Referring Phys: [place]  1. Left ventricular ejection fraction, by visual estimation, is 60 to 65%. The left ventricle has normal function. There is no left ventricular hypertrophy.  2. The left ventricle has no regional wall motion abnormalities.  3. Global right ventricle has normal systolic function.The right ventricular size is normal. No increase in right ventricular wall thickness.  4. Left atrial size was normal.  5. Right atrial size was normal.  6. The mitral valve is normal in structure. Trivial mitral valve regurgitation. No evidence of mitral stenosis.  7. The tricuspid valve is normal in structure.  8. The tricuspid valve is normal in structure. Tricuspid valve regurgitation is not demonstrated.  9. The aortic valve is normal in structure. Aortic valve regurgitation is not visualized. No evidence of aortic valve sclerosis or stenosis. 10. The pulmonic valve was normal in structure. Pulmonic valve regurgitation is not visualized. 11. The inferior vena cava is dilated in size with <50% respiratory variability, suggesting right atrial pressure of 15 mmHg. 12. No evidence of valvular vegetations on this transthoracic echocardiogram. Would recommend a transesophageal echocardiogram to exclude infective endocarditis if clinically indicated. FINDINGS  Left Ventricle: Left ventricular ejection fraction, by visual  estimation, is 60 to 65%. The left ventricle has normal function. The left ventricle has no regional wall motion abnormalities. There is no left ventricular hypertrophy. Normal left atrial pressure. Right Ventricle: The right ventricular size is normal. No increase in right ventricular wall thickness. Global RV systolic function is has normal systolic function. Left Atrium: Left atrial size was normal in size. Right Atrium: Right atrial size was normal in size Pericardium: There is no evidence of pericardial effusion. Mitral Valve: The mitral valve is normal in structure. Trivial mitral valve regurgitation. No evidence of mitral valve stenosis by observation. Tricuspid Valve: The tricuspid valve is normal in structure. Tricuspid valve regurgitation is not demonstrated. Aortic Valve: The aortic valve is normal in structure. Aortic valve regurgitation is not visualized. The aortic valve is structurally normal, with no evidence of sclerosis or stenosis. Pulmonic Valve: The pulmonic valve was normal in structure. Pulmonic valve regurgitation is not visualized. Pulmonic regurgitation is not visualized. Aorta: The aortic root, ascending aorta and aortic arch are all structurally normal, with no evidence of dilitation or obstruction. Venous: The inferior vena cava is dilated in size with less than 50% respiratory variability, suggesting right atrial pressure of 15 mmHg. IAS/Shunts: No atrial level shunt detected by color flow Doppler. There is no evidence of a patent foramen ovale. No ventricular septal defect is seen or detected. There is no evidence of an atrial septal defect. Additional Comments: No evidence of valvular vegetations on this transthoracic echocardiogram. Would recommend a transesophageal echocardiogram to exclude infective endocarditis if clinically indicated.  LEFT VENTRICLE PLAX 2D LVIDd:         5.25 cm  Diastology LVIDs:         3.44 cm  LV e' lateral:   11.30 cm/s LV PW:         0.83 cm  LV E/e'  lateral: 9.8 LV IVS:        0.98 cm  LV e' medial:    8.49 cm/s LVOT diam:     2.10 cm  LV E/e' medial:  13.1 LV SV:         84 ml LV SV Index:   30.46 LVOT Area:     3.46 cm  RIGHT VENTRICLE RV S prime:     12.60 cm/s TAPSE (M-mode): 2.0 cm LEFT ATRIUM             Index       RIGHT ATRIUM           Index LA diam:        4.20 cm 1.60 cm/m  RA Area:     14.40 cm LA Vol (A2C):   64.4 ml 24.60 ml/m RA Volume:   38.40 ml  14.67 ml/m LA Vol (A4C):   56.4 ml 21.54 ml/m LA Biplane Vol: 60.4 ml 23.07 ml/m  AORTIC VALVE LVOT Vmax:   102.00 cm/s LVOT Vmean:  74.100 cm/s LVOT VTI:    0.234 m  AORTA Ao Root diam: 3.10 cm MITRAL VALVE MV Area (PHT): 4.39 cm              SHUNTS MV PHT:        50.17 msec            Systemic VTI:  0.23 m MV Decel Time: 173 msec              Systemic Diam: 2.10 cm MV E velocity: 111.00 cm/s 103 cm/s MV A velocity: 68.40 cm/s  70.3 cm/s MV E/A ratio:  1.62        1.5  Candee Furbish MD Electronically signed by Candee Furbish MD Signature Date/Time: 11/01/2019/11:31:47 AM    Final    CT HEAD CODE STROKE WO CONTRAST  Result Date: 10/27/2019 CLINICAL DATA:  Code stroke. Encephalopathy. Coronavirus infection. EXAM: CT HEAD WITHOUT CONTRAST TECHNIQUE: Contiguous axial images were obtained from the base of the skull through the vertex without intravenous contrast. COMPARISON:  MRI 02/18/2016 FINDINGS: Brain: No sign of acute infarction, mass lesion, hemorrhage, hydrocephalus or extra-axial collection. Physiologic calcification of the basal ganglia. Vascular: No abnormal vascular finding. Skull: Normal Sinuses/Orbits: Clear/normal Other: None ASPECTS ([place] Stroke Program Early CT Score) - Ganglionic level infarction (caudate, lentiform nuclei, internal capsule, insula, M1-M3 cortex): 7 - Supraganglionic infarction (M4-M6 cortex): 3 Total score (0-10 with 10 being normal): 10 IMPRESSION: 1. No acute finding.  Negative head CT. 2. ASPECTS is 10 3. Call report in progress. Electronically Signed   By:  Nelson Chimes M.D.   On: 10/27/2019 20:43    Echocardiogram 11/01/2019:  1. Left ventricular ejection fraction, by visual estimation, is 60 to 65%. The left ventricle has normal function. There is no left ventricular hypertrophy.  2. The left ventricle has no regional wall motion abnormalities.  3. Global right ventricle has normal systolic function.The right ventricular size is normal. No increase in right ventricular wall thickness.  4. Left atrial size was normal.  5. Right atrial size was normal.  6. The mitral valve is normal in structure. Trivial mitral valve regurgitation. No evidence of mitral stenosis.  7. The tricuspid valve is normal in structure.  8. The tricuspid valve is normal in structure. Tricuspid valve regurgitation is not demonstrated.  9. The aortic valve is normal in structure. Aortic valve regurgitation is not visualized. No evidence of aortic valve sclerosis or stenosis. 10. The pulmonic valve was normal in structure. Pulmonic valve regurgitation is not visualized. 11. The inferior vena cava is dilated in size with <50% respiratory variability, suggesting right atrial pressure of 15 mmHg. 12. No evidence of valvular vegetations on this transthoracic echocardiogram. Would recommend a transesophageal echocardiogram to exclude infective endocarditis if clinically indicated.  Subjective: Feels well, wants to go home.  No dyspnea or other issues reported.  Discharge Exam: Vitals:   11/02/19 0600 11/02/19 0752  BP:  128/86  Pulse: 82 85  Resp:  20  Temp:  97.8 F (36.6 C)  SpO2: (!) 70% 92%   General: Pt is alert, awake, not in acute distress Cardiovascular: RRR Respiratory: Nonlabored, no wheezing noted.  Labs: Basic Metabolic Panel: Recent Labs  Lab 10/27/19 1102 10/27/19 1232 10/27/19 2055 10/27/19 2055 10/28/19 0321 10/28/19 0321 10/28/19 0550 10/28/19 0916 10/29/19 0140 10/31/19 0420 11/01/19 0028  NA   < >  --  134*   < > 136   < > 137 137 135 136  137  K   < >  --  4.0   < > 4.2   < > 4.1 4.0 3.6 3.7 3.3*  CL   < >  --  96*  --  97*  --   --   --  98 97* 97*  CO2   < >  --  23  --  24  --   --   --  27 29 30   GLUCOSE   < >  --  122*  --  138*  --   --   --  181* 196* 123*  BUN   < >  --  16  --  17  --   --   --  20 21* 24*  CREATININE   < >  --  1.15  --  1.04  --   --   --  0.81 0.83 1.04  CALCIUM   < >  --  9.3  --  9.3  --   --   --  9.3 9.4 9.6  MG  --  2.4  --   --  2.4  --   --   --  2.4  --   --   PHOS  --   --   --   --  4.9*  --   --   --  2.9  --   --    < > = values in this interval not displayed.   Liver Function Tests: Recent Labs  Lab 10/27/19 2055 10/28/19 0321 10/29/19 0140 10/31/19 0420 11/01/19 0028  AST 223* 200* 146* 132* 121*  ALT 67* 69* 75* 128* 136*  ALKPHOS 81 76 72 74 83  BILITOT 1.4* 1.4* 1.0 1.3* 0.9  PROT 7.8 8.3* 7.3 6.9 6.8  ALBUMIN 2.7* 3.0* 2.8* 2.9* 2.9*   Recent Labs  Lab 10/28/19 0442  AMMONIA 17   CBC: Recent Labs  Lab 10/27/19 1102 10/27/19 1102 10/28/19 0321 10/28/19 0550 10/28/19 0916 10/29/19 0140 10/31/19 0420  WBC 8.8  --  8.8  --   --  7.1 7.3  NEUTROABS 6.5  --  6.9  --   --  5.7 6.0  HGB 10.9*   < > 11.4* 11.6* 11.2* 10.1* 10.7*  HCT 33.9*   < > 35.4* 34.0* 33.0* 33.5* 35.0*  MCV 105.3*  --  108.9*  --   --  108.1* 109.4*  PLT 127*  --  156  --   --  153 180   < > = values in this interval not displayed.   CBG: Recent Labs  Lab 10/27/19 1937 11/01/19 1036 11/01/19 1626 11/01/19 2012 11/02/19 0755  GLUCAP 120* 179* 122* 170* 137*   Microbiology Recent Results (from the past 240 hour(s))  Blood Culture (routine x 2)     Status: None  Collection Time: 10/27/19 11:02 AM   Specimen: BLOOD RIGHT HAND  Result Value Ref Range Status   Specimen Description   Final    BLOOD RIGHT HAND Performed at [facility] [address], [place], [place] [address]    Special Requests   Final    BOTTLES DRAWN AEROBIC AND ANAEROBIC Blood Culture  results may not be optimal due to an inadequate volume of blood received in culture bottles Performed at [facility] [address] [place], [place] [address]    Culture   Final    NO GROWTH 5 DAYS Performed at [facility], [place] [address] [place], [place] [address]    Report Status 11/01/2019 FINAL  Final  Blood Culture (routine x 2)     Status: Abnormal   Collection Time: 10/27/19 11:02 AM   Specimen: BLOOD  Result Value Ref Range Status   Specimen Description   Final    BLOOD LEFT ANTECUBITAL Performed at [facility] [address] [place],  [address]    Special Requests   Final    BOTTLES DRAWN AEROBIC AND ANAEROBIC Blood Culture adequate volume Performed at [facility] [address], [place], [place] [address]    Culture  Setup Time   Final    GRAM POSITIVE COCCI IN CLUSTERS IN BOTH AEROBIC AND ANAEROBIC BOTTLES CRITICAL RESULT CALLED TO, READ BACK BY AND VERIFIED WITH: L. CHEN, PHARMD ([place])  AT 7741 ON 10/28/19 BY C. JESSUP, MT. Performed at [facility], [place] [address] [place], [place] [address]    Culture STAPHYLOCOCCUS EPIDERMIDIS (A)  Final   Report Status 11/02/2019 FINAL  Final   Organism ID, Bacteria STAPHYLOCOCCUS EPIDERMIDIS  Final      Susceptibility   Staphylococcus epidermidis - MIC*    CIPROFLOXACIN <=0.5 SENSITIVE Sensitive     ERYTHROMYCIN <=0.25 SENSITIVE Sensitive     GENTAMICIN <=0.5 SENSITIVE Sensitive     OXACILLIN <=0.25 SENSITIVE Sensitive     TETRACYCLINE <=1 SENSITIVE Sensitive     VANCOMYCIN 1 SENSITIVE Sensitive     TRIMETH/SULFA <=10 SENSITIVE Sensitive     CLINDAMYCIN <=0.25 SENSITIVE Sensitive     RIFAMPIN <=0.5 SENSITIVE Sensitive     Inducible Clindamycin NEGATIVE Sensitive     * STAPHYLOCOCCUS EPIDERMIDIS  Respiratory Panel by RT PCR (Flu A&B, Covid) - Nasopharyngeal Swab     Status: Abnormal   Collection Time: 10/27/19 12:32 PM   Specimen:  Nasopharyngeal Swab  Result Value Ref Range Status   SARS Coronavirus 2 by RT PCR POSITIVE (A) NEGATIVE Final    Comment: RESULT CALLED TO, READ BACK BY AND VERIFIED WITH: Q.MILNER,RN [id number] @1455  BY V.WILKINS (NOTE) SARS-CoV-2 target nucleic acids are DETECTED. SARS-CoV-2 RNA is generally detectable in upper respiratory specimens  during the acute phase of infection. Positive results are indicative of the presence of the identified virus, but do not rule out bacterial infection or co-infection with other pathogens not detected by the test. Clinical correlation with patient history and other diagnostic information is necessary to determine patient infection status. The expected result is Negative. Fact Sheet for Patients:  PinkCheek.be Fact Sheet for Healthcare Providers: GravelBags.it This test is not yet approved or cleared by the [place] FDA and  has been authorized for detection and/or diagnosis of SARS-CoV-2 by FDA under an Emergency Use Authorization (EUA).  This EUA will remain in effect (meaning this test can be used)  for the duration of  the COVID-19 declaration under Section 564(b)(1) of the Act, 21 U.S.C. section 360bbb-3(b)(1), unless the authorization is terminated or revoked sooner.    Influenza A by PCR NEGATIVE NEGATIVE Final   Influenza B by PCR NEGATIVE NEGATIVE Final    Comment: (NOTE) The Xpert Xpress SARS-CoV-2/FLU/RSV assay is intended as an aid in  the diagnosis of influenza from Nasopharyngeal swab specimens and  should not be used as a sole basis for treatment. Nasal washings and  aspirates are unacceptable for Xpert Xpress SARS-CoV-2/FLU/RSV  testing. Fact Sheet for Patients: PinkCheek.be Fact Sheet for Healthcare Providers: GravelBags.it This test is not yet approved or cleared by the [place] FDA and  has been authorized for  detection and/or diagnosis of SARS-CoV-2 by  FDA under an Emergency Use Authorization (EUA). This EUA will remain  in effect (meaning this test can be used) for the duration of the  Covid-19 declaration under Section 564(b)(1) of the Act, 21  U.S.C. section 360bbb-3(b)(1), unless the authorization is  terminated or revoked. Performed at [facility], [place] [address] [place], [place] [address]   Culture, blood (routine x 2)     Status: None   Collection Time: 10/28/19  1:53 PM   Specimen: BLOOD  Result Value Ref Range Status   Specimen Description   Final    BLOOD RIGHT ANTECUBITAL Performed at [facility] [address] [place], [place] [address]    Special Requests   Final    BOTTLES DRAWN AEROBIC ONLY Blood Culture adequate volume Performed at [facility] [address], [place], [place] [address]    Culture   Final    NO GROWTH 5 DAYS Performed at [facility], [place] [address] [place], [place] [address]    Report Status 11/02/2019 FINAL  Final  Culture, blood (routine x 2)     Status: Abnormal (Preliminary result)   Collection Time: 10/28/19  6:10 PM   Specimen: BLOOD  Result Value Ref Range Status   Specimen Description   Final    BLOOD RIGHT ANTECUBITAL Performed at [facility] [address] [place], [place] [address]    Special Requests   Final    BOTTLES DRAWN AEROBIC AND ANAEROBIC Blood Culture adequate volume Performed at [facility] [address] [place], [place] [address]    Culture  Setup Time   Final    ANAEROBIC BOTTLE ONLY GRAM POSITIVE COCCI CRITICAL RESULT CALLED TO, READ BACK BY AND VERIFIED WITH: Domenick Gong [facility] 10/30/19 1923 JDW Performed at [facility], [place] [address] [name], [place] [address]    Culture STAPHYLOCOCCUS EPIDERMIDIS (A)  Final   Report Status PENDING  Incomplete   Organism ID, Bacteria STAPHYLOCOCCUS EPIDERMIDIS   Final      Susceptibility   Staphylococcus epidermidis - MIC*    CIPROFLOXACIN <=0.5 SENSITIVE Sensitive     ERYTHROMYCIN >=8 RESISTANT Resistant     GENTAMICIN <=0.5 SENSITIVE Sensitive     OXACILLIN >=4 RESISTANT Resistant     TETRACYCLINE >=16 RESISTANT Resistant     VANCOMYCIN 1 SENSITIVE Sensitive     TRIMETH/SULFA <=10 SENSITIVE Sensitive     CLINDAMYCIN >=8 RESISTANT Resistant     RIFAMPIN <=0.5 SENSITIVE Sensitive     Inducible Clindamycin NEGATIVE Sensitive     * STAPHYLOCOCCUS EPIDERMIDIS  Culture, blood (routine x 2)     Status: None (Preliminary result)   Collection Time: 10/30/19  9:45 PM  Specimen: Left Antecubital; Blood  Result Value Ref Range Status   Specimen Description   Final    LEFT ANTECUBITAL Performed at [facility] [address], [place], [place] [address]    Special Requests   Final    BOTTLES DRAWN AEROBIC AND ANAEROBIC Blood Culture adequate volume   Culture   Final    NO GROWTH 2 DAYS Performed at [facility], [place] [address] [place], [place] [address]    Report Status PENDING  Incomplete  Culture, blood (routine x 2)     Status: None (Preliminary result)   Collection Time: 10/30/19  9:50 PM   Specimen: Left Antecubital; Blood  Result Value Ref Range Status   Specimen Description   Final    LEFT ANTECUBITAL Performed at [facility] [address] [place], [place] [address]    Special Requests   Final    BOTTLES DRAWN AEROBIC AND ANAEROBIC Blood Culture adequate volume   Culture   Final    NO GROWTH 2 DAYS Performed at [facility], [place] [address] [place], [place] [address]    Report Status PENDING  Incomplete    Time coordinating discharge: Approximately 40 minutes  Patrecia Pour, MD  [facility] 11/02/2019, 10:49 AM

## 2019-11-02 NOTE — Progress Notes (Signed)
During shift assessment, patient stated his ride would be here at 11 am. Paged MD to obtain d/c order. AC delivered walker to the room.  Patient is ready for discharge, however patient was planning to use Lyft for a ride. Informed patient that [facility] Confer, and taxi services will not pick covid patients up from the hospital.  Patient is working on finding a family member or friend to pick him up now.

## 2019-11-02 NOTE — Progress Notes (Signed)
Patient stated he has a ride coming to pick him up in 15 min, again this is a Visual merchandiser. Informed patient they do not want to transfer patients from this hospital and he should avoid public transportation during quarantine. Contacted case Insurance underwriter for [employer]. Patient made aware.

## 2019-11-02 NOTE — TOC Transition Note (Signed)
Transition of Care [facility]) - CM/SW Discharge Note   Patient Details  Name: [name] MRN: [id number] Date of Birth: 1986/09/24  Transition of Care [facility]) CM/SW Contact:  Eileen Stanford, LCSW Phone Number: 11/02/2019, 12:37 PM   Clinical Narrative:   Pt does not have a ride home. Pt is COVID +. PTAR has been arranged. RN aware.    Final next level of care: [place] Barriers to Discharge: Continued Medical Work up   Patient Goals and CMS Choice        Discharge Placement                Patient to be transferred to facility by: daughter      Discharge Plan and Services In-house Referral: Clinical Social Work   Post Acute Care Choice: Museum/gallery conservator, Home Health          DME Arranged: 3-N-1, Walker rolling, Tub bench DME Agency: [employer] Date DME Agency Contacted: 11/01/19   Representative spoke with at DME Agency: [place]: PT, OT, Social Work [employer] Agency: [place] Date [place]: 11/01/19   Representative spoke with at [place]: [place] Interventions     Readmission Risk Interventions Readmission Risk Prevention Plan 11/01/2019  Home Care Screening Complete  Medication Review (RN CM) Complete  Some recent data might be hidden

## 2019-11-04 LAB — GLUCOSE, CAPILLARY: Glucose-Capillary: 129 mg/dL — ABNORMAL HIGH (ref 70–99)

## 2019-11-05 LAB — CULTURE, BLOOD (ROUTINE X 2)
Culture: NO GROWTH
Culture: NO GROWTH
Special Requests: ADEQUATE
Special Requests: ADEQUATE

## 2019-11-09 LAB — CULTURE, BLOOD (ROUTINE X 2): Special Requests: ADEQUATE

## 2019-11-19 ENCOUNTER — Other Ambulatory Visit: Payer: Self-pay

## 2020-01-09 ENCOUNTER — Ambulatory Visit: Payer: Medicare HMO | Admitting: Neurology

## 2020-01-09 ENCOUNTER — Telehealth: Payer: Self-pay | Admitting: Neurology

## 2020-01-09 ENCOUNTER — Other Ambulatory Visit: Payer: Self-pay

## 2020-01-09 NOTE — Telephone Encounter (Signed)
The patient showed up late for his appointment today.,  The patient had to be rescheduled.

## 2020-02-17 ENCOUNTER — Encounter: Payer: Self-pay | Admitting: Neurology

## 2020-02-17 ENCOUNTER — Other Ambulatory Visit: Payer: Self-pay

## 2020-02-17 ENCOUNTER — Ambulatory Visit (INDEPENDENT_AMBULATORY_CARE_PROVIDER_SITE_OTHER): Payer: Medicare HMO | Admitting: Neurology

## 2020-02-17 VITALS — BP 128/77 | HR 111 | Temp 98.7°F | Ht 75.0 in | Wt 278.0 lb

## 2020-02-17 DIAGNOSIS — G603 Idiopathic progressive neuropathy: Secondary | ICD-10-CM

## 2020-02-17 DIAGNOSIS — G43719 Chronic migraine without aura, intractable, without status migrainosus: Secondary | ICD-10-CM | POA: Diagnosis not present

## 2020-02-17 MED ORDER — TOPIRAMATE 100 MG PO TABS: 100.0000 mg | ORAL_TABLET | Freq: Every day | ORAL | 1 refills | Status: DC

## 2020-02-17 NOTE — Progress Notes (Signed)
PATIENT: [name] DOB: [birth year]  REASON FOR VISIT: follow up HISTORY FROM: patient  HISTORY OF PRESENT ILLNESS: Today 02/17/20  [name] is a 34 year old male with history of intractable migraine headache.  He has chronic HIV.  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  Nerve conduction evaluation in December 2020 showed evidence of a fairly significant primarily axonal peripheral neuropathy, EMG of the right arm and right leg do not show clear evidence of an underlying myopathic disorder. He has some numbness in the feet bilaterally. He was hospitalized for a significant Covid infection in January, feels following Covid, has had more significant issues with balance and walking. He continues to have enlargement of bilateral gastrocnemius muscles, left more than right, says primary gives him some fluid pills, that helps. His headaches come and go, remains on Topamax 100 mg at bedtime. He continues to drive a car. He presents today for evaluation unaccompanied.  HISTORY 09/02/2019 Dr. Jannifer Franklin: [name] is a 34 year old right-handed black male with a history of intractable migraine headache.  The patient is on 100 mg of the Topamax daily which has reduced his headaches to having only 3 or so a week, he takes Excedrin Migraine for the headache when he gets them and this seems to help.  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  The patient however has a chronic HIV infection and is on antiviral agents.  The patient has reported 2 falls recently, 1 fall 3 weeks ago and then 1 a week ago.  He reports some difficulty getting up from a chair.  He has some numbness in the feet on both sides.  He has some discomfort in the groin area and hips.  He has some difficulty with controlling the bowels and bladder at times.  He continues to drink but he claims that  he only has about 7 beers a week.  He returns to the office today for an evaluation.  The patient does report frequent cramps in the upper thighs bilaterally.  REVIEW OF SYSTEMS: Out of a complete 14 system review of symptoms, the patient complains only of the following symptoms, and all other reviewed systems are negative.  Walking difficulty, numbness, swelling  ALLERGIES: Allergies  Allergen Reactions  . Dapsone Other (See Comments)    Per centricity "G6PD deficient"  . Primaquine Phosphate Other (See Comments)    Per Centricity "G6PD deficient"    HOME MEDICATIONS: Outpatient Medications Prior to Visit  Medication Sig Dispense Refill  . albuterol (PROVENTIL HFA;VENTOLIN HFA) 108 (90 BASE) MCG/ACT inhaler Inhale 2 puffs into the lungs every 6 (six) hours as needed for wheezing or shortness of breath.    . alprazolam (XANAX) 2 MG tablet Take 2 mg by mouth 3 (three) times daily.    . benzonatate (TESSALON) 100 MG capsule Take 200 mg by mouth every 8 (eight) hours as needed for cough.    . bictegravir-emtricitabine-tenofovir AF (BIKTARVY) 50-200-25 MG TABS tablet Take 1 tablet by mouth daily.     . cetirizine (ZYRTEC) 10 MG tablet Take 1 tablet (10 mg total) by mouth daily.    . famotidine (PEPCID) 40 MG tablet Take 40 mg by mouth daily.    [name] FLUoxetine (PROZAC) 40 MG capsule Take 40 mg by mouth daily.    . folic acid (FOLVITE) 1 MG tablet Take 1 mg by mouth daily.    . haloperidol (  HALDOL) 5 MG tablet Take 5 mg by mouth 2 (two) times daily.    . hydrOXYzine (ATARAX/VISTARIL) 50 MG tablet Take 1 tablet (50 mg total) by mouth every 6 (six) hours as needed for anxiety. 30 tablet 0  . linaclotide (LINZESS) 145 MCG CAPS capsule Take 145 mcg by mouth daily as needed (constipation).    [name] lurasidone 120 MG TABS Take 1 tablet (120 mg total) by mouth daily. 30 tablet 0  . ondansetron (ZOFRAN) 8 MG tablet Take 8 mg by mouth every 8 (eight) hours as needed for nausea/vomiting.    .  sulfamethoxazole-trimethoprim (BACTRIM DS) 800-160 MG tablet Take 1 tablet by mouth every 12 (twelve) hours. 6 tablet 0  . thiamine (VITAMIN B-1) 100 MG tablet Take 100 mg by mouth daily.     [name] topiramate (TOPAMAX) 100 MG tablet Take 1 tablet (100 mg total) by mouth at bedtime. (Patient taking differently: Take 75 mg by mouth at bedtime. ) 90 tablet 1  . traZODone (DESYREL) 300 MG tablet Take 1 tablet (300 mg total) by mouth at bedtime as needed for sleep. (Patient taking differently: Take 300 mg by mouth at bedtime. ) 30 tablet 0  . valACYclovir (VALTREX) 1000 MG tablet Take 1 tablet (1,000 mg total) by mouth daily.    . Vitamin D, Ergocalciferol, (DRISDOL) 1.25 MG (50000 UNIT) CAPS capsule Take 50,000 Units by mouth once a week.    . zolpidem (AMBIEN) 10 MG tablet Take 10 mg by mouth at bedtime.     No facility-administered medications prior to visit.    PAST MEDICAL HISTORY: Past Medical History:  Diagnosis Date  . Bipolar 1 disorder ([place])   . Depression   . Dizziness and giddiness 02/01/2016  . Herpes genitalia   . HIV disease ([place])   . Hypertension   . Migraine headache 02/01/2016  . Peripheral neuropathy 10/01/2019  . PTSD (post-traumatic stress disorder)   . Schizoaffective disorder ([place])   . Seizures ([place])     PAST SURGICAL HISTORY: Past Surgical History:  Procedure Laterality Date  . BACK SURGERY    . HAND SURGERY      FAMILY HISTORY: Family History  Problem Relation Age of Onset  . Alcohol abuse Mother   . Schizophrenia Father   . Depression Father   . Alcohol abuse Father   . Alcohol abuse Paternal Uncle   . Alcohol abuse Paternal Uncle     SOCIAL HISTORY: Social History   Socioeconomic History  . Marital status: Single    Spouse name: Not on file  . Number of children: Not on file  . Years of education: Not on file  . Highest education level: Not on file  Occupational History  . Occupation: [employer]  Tobacco Use  . Smoking status: Current Every Day  Smoker    Packs/day: 1.00    Types: Cigarettes  . Smokeless tobacco: Never Used  Substance and Sexual Activity  . Alcohol use: Yes    Alcohol/week: 14.0 standard drinks    Types: 14 Cans of beer per week    Comment: 5-6 40oz per day  . Drug use: No  . Sexual activity: Not Currently    Birth control/protection: Condom  Other Topics Concern  . Not on file  Social History Narrative   Lives at home alone   Right-handed   Drinks 2-3 sodas per day   Social Determinants of Health   Financial Resource Strain:   . Difficulty of Paying Living Expenses:   Food  Insecurity:   . Worried About Charity fundraiser in the Last Year:   . Arboriculturist in the Last Year:   Transportation Needs:   . Film/video editor (Medical):   [name] Lack of Transportation (Non-Medical):   Physical Activity:   . Days of Exercise per Week:   . Minutes of Exercise per Session:   Stress:   . Feeling of Stress :   Social Connections:   . Frequency of Communication with Friends and Family:   . Frequency of Social Gatherings with Friends and Family:   . Attends Religious Services:   . Active Member of Clubs or Organizations:   . Attends Archivist Meetings:   [name] Marital Status:   Intimate Partner Violence:   . Fear of Current or Ex-Partner:   . Emotionally Abused:   [name] Physically Abused:   . Sexually Abused:    PHYSICAL EXAM  Vitals:   02/17/20 1520  BP: 128/77  Pulse: (!) 111  Temp: 98.7 F (37.1 C)  Weight: 278 lb (126.1 kg)  Height: 6' 3"  (1.905 m)   Body mass index is 34.75 kg/m.  Generalized: Well developed, in no acute distress   Neurological examination  Mentation: Alert oriented to time, place, history taking. Follows all commands speech and language fluent Cranial nerve II-XII: Pupils were equal round reactive to light. Extraocular movements were full, visual field were full on confrontational test. Facial sensation and strength were normal.  Head turning and shoulder shrug   were normal and symmetric. Motor: Good strength of all extremities, noted to have significant enlargement of bilateral gastrocnemius muscles, left greater than right Sensory: Sensory testing is intact to soft touch on all 4 extremities. No evidence of extinction is noted.  Coordination: Cerebellar testing reveals good finger-nose-finger and heel-to-shin bilaterally.  Gait and station: Has to rock from seated position to stand, gait is wide-based, unsteady, left foot rolls inward, no assistive device Reflexes: Deep tendon reflexes are symmetric but depressed  DIAGNOSTIC DATA (LABS, IMAGING, TESTING) - I reviewed patient records, labs, notes, testing and imaging myself where available.  Lab Results  Component Value Date   WBC 7.3 10/31/2019   HGB 10.7 (L) 10/31/2019   HCT 35.0 (L) 10/31/2019   MCV 109.4 (H) 10/31/2019   PLT 180 10/31/2019      Component Value Date/Time   NA 137 11/01/2019 0028   NA 144 09/02/2019 1315   K 3.3 (L) 11/01/2019 0028   CL 97 (L) 11/01/2019 0028   CO2 30 11/01/2019 0028   GLUCOSE 123 (H) 11/01/2019 0028   BUN 24 (H) 11/01/2019 0028   BUN 5 (L) 09/02/2019 1315   CREATININE 1.04 11/01/2019 0028   CALCIUM 9.6 11/01/2019 0028   PROT 6.8 11/01/2019 0028   PROT 7.9 10/01/2019 1700   ALBUMIN 2.9 (L) 11/01/2019 0028   ALBUMIN 4.3 09/02/2019 1315   AST 121 (H) 11/01/2019 0028   ALT 136 (H) 11/01/2019 0028   ALKPHOS 83 11/01/2019 0028   BILITOT 0.9 11/01/2019 0028   BILITOT 0.4 09/02/2019 1315   GFRNONAA >60 11/01/2019 0028   GFRAA >60 11/01/2019 0028   Lab Results  Component Value Date   TRIG 94 10/27/2019   Lab Results  Component Value Date   HGBA1C 6.1 (H) 10/28/2019   Lab Results  Component Value Date   VITAMINB12 390 10/28/2019   Lab Results  Component Value Date   TSH 0.309 (L) 10/28/2019      ASSESSMENT AND  PLAN 34 y.o. year old male  has a past medical history of Bipolar 1 disorder ([place]), Depression, Dizziness and giddiness  (02/01/2016), Herpes genitalia, HIV disease ([place]), Hypertension, Migraine headache (02/01/2016), Peripheral neuropathy (10/01/2019), PTSD (post-traumatic stress disorder), Schizoaffective disorder ([place]), and Seizures ([place]). here with:  1. Migraine headache 2. Peripheral neuropathy, NCV shows evidence of fairly significant primarily axonal peripheral neuropathy, EMG of the right arm and right leg do not show clear evidence of underlying myopathic disorder 3. HIV, on chronic therapy  He continues to complain of issues with his balance and gait, these worsened following Covid illness earlier this year. I will refer him for physical and occupational therapy via home health. He will remain on Topamax. EMG did not show clear evidence of an underlying myopathic disorder (will not order dystrophin test at this time), he continues to exhibit significant enlargement of bilateral gastrocnemius muscles on exam. He will follow-up in 4 months with Dr. Jannifer Franklin or sooner if needed. CRP and sed rate had improved in December, we will continue to follow this over time.  I spent 30 minutes of face-to-face and non-face-to-face time with patient.  This included previsit chart review, lab review, study review, order entry, electronic health record documentation, patient education.  Butler Denmark, AGNP-C, DNP 02/17/2020, 3:24 PM [facility] [address], [place], [place] [address] [phone]

## 2020-02-17 NOTE — Progress Notes (Signed)
I have read the note, and I agree with the clinical assessment and plan.  Loray Akard K Zaylia Riolo   

## 2020-02-17 NOTE — Patient Instructions (Signed)
I will refer you to PT/OT Continue on Topamax Continue follow-up with PCP  See you back in 4 month with Dr. Jannifer Franklin

## 2020-02-20 ENCOUNTER — Telehealth: Payer: Self-pay | Admitting: Neurology

## 2020-02-20 NOTE — Telephone Encounter (Signed)
Rep with [facility] called to inform they are unable to take pt at this time due to staffing

## 2020-02-26 NOTE — Telephone Encounter (Signed)
I called [facility] and they are going to see if they can get patient in [facility] will call me back with answer.

## 2020-03-14 ENCOUNTER — Encounter (HOSPITAL_COMMUNITY): Payer: Self-pay

## 2020-03-14 ENCOUNTER — Inpatient Hospital Stay (HOSPITAL_COMMUNITY)
Admission: EM | Admit: 2020-03-14 | Discharge: 2020-03-20 | DRG: 974 | Disposition: A | Discharge status: Home / Self Care | Payer: Medicare HMO | Attending: Family Medicine | Admitting: Family Medicine

## 2020-03-14 ENCOUNTER — Other Ambulatory Visit: Payer: Self-pay

## 2020-03-14 ENCOUNTER — Emergency Department (HOSPITAL_COMMUNITY): Payer: Medicare HMO

## 2020-03-14 DIAGNOSIS — Z79899 Other long term (current) drug therapy: Secondary | ICD-10-CM

## 2020-03-14 DIAGNOSIS — D638 Anemia in other chronic diseases classified elsewhere: Secondary | ICD-10-CM | POA: Diagnosis present

## 2020-03-14 DIAGNOSIS — K7010 Alcoholic hepatitis without ascites: Secondary | ICD-10-CM | POA: Diagnosis present

## 2020-03-14 DIAGNOSIS — F251 Schizoaffective disorder, depressive type: Secondary | ICD-10-CM | POA: Diagnosis present

## 2020-03-14 DIAGNOSIS — Z818 Family history of other mental and behavioral disorders: Secondary | ICD-10-CM

## 2020-03-14 DIAGNOSIS — E871 Hypo-osmolality and hyponatremia: Secondary | ICD-10-CM | POA: Diagnosis present

## 2020-03-14 DIAGNOSIS — G40909 Epilepsy, unspecified, not intractable, without status epilepticus: Secondary | ICD-10-CM | POA: Diagnosis not present

## 2020-03-14 DIAGNOSIS — F17210 Nicotine dependence, cigarettes, uncomplicated: Secondary | ICD-10-CM | POA: Diagnosis present

## 2020-03-14 DIAGNOSIS — K7290 Hepatic failure, unspecified without coma: Secondary | ICD-10-CM | POA: Diagnosis present

## 2020-03-14 DIAGNOSIS — J810 Acute pulmonary edema: Secondary | ICD-10-CM | POA: Diagnosis present

## 2020-03-14 DIAGNOSIS — L03314 Cellulitis of groin: Secondary | ICD-10-CM | POA: Diagnosis present

## 2020-03-14 DIAGNOSIS — R7401 Elevation of levels of liver transaminase levels: Secondary | ICD-10-CM

## 2020-03-14 DIAGNOSIS — N390 Urinary tract infection, site not specified: Secondary | ICD-10-CM

## 2020-03-14 DIAGNOSIS — N3091 Cystitis, unspecified with hematuria: Secondary | ICD-10-CM | POA: Diagnosis present

## 2020-03-14 DIAGNOSIS — L03311 Cellulitis of abdominal wall: Secondary | ICD-10-CM | POA: Diagnosis present

## 2020-03-14 DIAGNOSIS — F1020 Alcohol dependence, uncomplicated: Secondary | ICD-10-CM | POA: Diagnosis present

## 2020-03-14 DIAGNOSIS — D539 Nutritional anemia, unspecified: Secondary | ICD-10-CM | POA: Diagnosis present

## 2020-03-14 DIAGNOSIS — R062 Wheezing: Secondary | ICD-10-CM | POA: Diagnosis present

## 2020-03-14 DIAGNOSIS — D696 Thrombocytopenia, unspecified: Secondary | ICD-10-CM | POA: Diagnosis present

## 2020-03-14 DIAGNOSIS — R7989 Other specified abnormal findings of blood chemistry: Secondary | ICD-10-CM | POA: Diagnosis present

## 2020-03-14 DIAGNOSIS — K7682 Hepatic encephalopathy: Secondary | ICD-10-CM | POA: Diagnosis present

## 2020-03-14 DIAGNOSIS — A419 Sepsis, unspecified organism: Secondary | ICD-10-CM | POA: Diagnosis present

## 2020-03-14 DIAGNOSIS — E876 Hypokalemia: Secondary | ICD-10-CM | POA: Diagnosis present

## 2020-03-14 DIAGNOSIS — N3090 Cystitis, unspecified without hematuria: Secondary | ICD-10-CM | POA: Diagnosis present

## 2020-03-14 DIAGNOSIS — E669 Obesity, unspecified: Secondary | ICD-10-CM | POA: Diagnosis present

## 2020-03-14 DIAGNOSIS — E861 Hypovolemia: Secondary | ICD-10-CM | POA: Diagnosis present

## 2020-03-14 DIAGNOSIS — G9341 Metabolic encephalopathy: Secondary | ICD-10-CM | POA: Diagnosis present

## 2020-03-14 DIAGNOSIS — Z8616 Personal history of COVID-19: Secondary | ICD-10-CM | POA: Diagnosis not present

## 2020-03-14 DIAGNOSIS — G9340 Encephalopathy, unspecified: Secondary | ICD-10-CM | POA: Diagnosis not present

## 2020-03-14 DIAGNOSIS — F4310 Post-traumatic stress disorder, unspecified: Secondary | ICD-10-CM | POA: Diagnosis present

## 2020-03-14 DIAGNOSIS — L089 Local infection of the skin and subcutaneous tissue, unspecified: Secondary | ICD-10-CM | POA: Diagnosis not present

## 2020-03-14 DIAGNOSIS — B20 Human immunodeficiency virus [HIV] disease: Secondary | ICD-10-CM | POA: Diagnosis present

## 2020-03-14 DIAGNOSIS — F259 Schizoaffective disorder, unspecified: Secondary | ICD-10-CM | POA: Diagnosis present

## 2020-03-14 DIAGNOSIS — D649 Anemia, unspecified: Secondary | ICD-10-CM | POA: Diagnosis not present

## 2020-03-14 DIAGNOSIS — Z6834 Body mass index (BMI) 34.0-34.9, adult: Secondary | ICD-10-CM

## 2020-03-14 DIAGNOSIS — R9431 Abnormal electrocardiogram [ECG] [EKG]: Secondary | ICD-10-CM | POA: Diagnosis present

## 2020-03-14 DIAGNOSIS — Z811 Family history of alcohol abuse and dependence: Secondary | ICD-10-CM

## 2020-03-14 DIAGNOSIS — K700 Alcoholic fatty liver: Secondary | ICD-10-CM | POA: Diagnosis not present

## 2020-03-14 DIAGNOSIS — F319 Bipolar disorder, unspecified: Secondary | ICD-10-CM | POA: Diagnosis present

## 2020-03-14 DIAGNOSIS — R600 Localized edema: Secondary | ICD-10-CM

## 2020-03-14 DIAGNOSIS — K759 Inflammatory liver disease, unspecified: Secondary | ICD-10-CM | POA: Diagnosis present

## 2020-03-14 DIAGNOSIS — L739 Follicular disorder, unspecified: Secondary | ICD-10-CM | POA: Diagnosis present

## 2020-03-14 DIAGNOSIS — K5900 Constipation, unspecified: Secondary | ICD-10-CM | POA: Diagnosis present

## 2020-03-14 DIAGNOSIS — K754 Autoimmune hepatitis: Secondary | ICD-10-CM | POA: Diagnosis present

## 2020-03-14 LAB — CBC WITH DIFFERENTIAL/PLATELET
Abs Immature Granulocytes: 0.23 10*3/uL — ABNORMAL HIGH (ref 0.00–0.07)
Basophils Absolute: 0.1 10*3/uL (ref 0.0–0.1)
Basophils Relative: 0 %
Eosinophils Absolute: 0 10*3/uL (ref 0.0–0.5)
Eosinophils Relative: 0 %
HCT: 26.3 % — ABNORMAL LOW (ref 39.0–52.0)
Hemoglobin: 8.4 g/dL — ABNORMAL LOW (ref 13.0–17.0)
Immature Granulocytes: 2 %
Lymphocytes Relative: 7 %
Lymphs Abs: 1 10*3/uL (ref 0.7–4.0)
MCH: 34.1 pg — ABNORMAL HIGH (ref 26.0–34.0)
MCHC: 31.9 g/dL (ref 30.0–36.0)
MCV: 106.9 fL — ABNORMAL HIGH (ref 80.0–100.0)
Monocytes Absolute: 1.4 10*3/uL — ABNORMAL HIGH (ref 0.1–1.0)
Monocytes Relative: 11 %
Neutro Abs: 10.3 10*3/uL — ABNORMAL HIGH (ref 1.7–7.7)
Neutrophils Relative %: 80 %
Platelets: 111 10*3/uL — ABNORMAL LOW (ref 150–400)
RBC: 2.46 MIL/uL — ABNORMAL LOW (ref 4.22–5.81)
RDW: 18.7 % — ABNORMAL HIGH (ref 11.5–15.5)
WBC: 12.9 10*3/uL — ABNORMAL HIGH (ref 4.0–10.5)
nRBC: 0 % (ref 0.0–0.2)

## 2020-03-14 LAB — ETHANOL: Alcohol, Ethyl (B): <10 mg/dL (ref <10)

## 2020-03-14 LAB — URINALYSIS, ROUTINE W REFLEX MICROSCOPIC
Glucose, UA: NEGATIVE mg/dL
Ketones, ur: 20 mg/dL — AB
Nitrite: NEGATIVE
Protein, ur: 100 mg/dL — AB
RBC / HPF: >50 RBC/hpf — ABNORMAL HIGH (ref 0–5)
Specific Gravity, Urine: 1.011 (ref 1.005–1.030)
WBC, UA: >50 WBC/hpf — ABNORMAL HIGH (ref 0–5)
pH: 6 (ref 5.0–8.0)

## 2020-03-14 LAB — COMPREHENSIVE METABOLIC PANEL
ALT: 116 U/L — ABNORMAL HIGH (ref 0–44)
AST: 442 U/L — ABNORMAL HIGH (ref 15–41)
Albumin: 2.3 g/dL — ABNORMAL LOW (ref 3.5–5.0)
Alkaline Phosphatase: 187 U/L — ABNORMAL HIGH (ref 38–126)
Anion gap: 16 — ABNORMAL HIGH (ref 5–15)
BUN: 10 mg/dL (ref 6–20)
CO2: 22 mmol/L (ref 22–32)
Calcium: 8.8 mg/dL — ABNORMAL LOW (ref 8.9–10.3)
Chloride: 89 mmol/L — ABNORMAL LOW (ref 98–111)
Creatinine, Ser: 0.94 mg/dL (ref 0.61–1.24)
GFR calc Af Amer: >60 mL/min (ref >60)
GFR calc non Af Amer: >60 mL/min (ref >60)
Glucose, Bld: 92 mg/dL (ref 70–99)
Potassium: 3.1 mmol/L — ABNORMAL LOW (ref 3.5–5.1)
Sodium: 127 mmol/L — ABNORMAL LOW (ref 135–145)
Total Bilirubin: 11.2 mg/dL — ABNORMAL HIGH (ref 0.3–1.2)
Total Protein: 7.8 g/dL (ref 6.5–8.1)

## 2020-03-14 LAB — SARS CORONAVIRUS 2 BY RT PCR (HOSPITAL ORDER, PERFORMED IN ~~LOC~~ HOSPITAL LAB): SARS Coronavirus 2: NEGATIVE

## 2020-03-14 LAB — AMMONIA: Ammonia: 29 umol/L (ref 9–35)

## 2020-03-14 LAB — RAPID URINE DRUG SCREEN, HOSP PERFORMED
Amphetamines: NOT DETECTED
Barbiturates: NOT DETECTED
Benzodiazepines: POSITIVE — AB
Cocaine: NOT DETECTED
Opiates: NOT DETECTED
Tetrahydrocannabinol: NOT DETECTED

## 2020-03-14 LAB — PROTIME-INR
INR: 1.4 — ABNORMAL HIGH (ref 0.8–1.2)
Prothrombin Time: 16.9 seconds — ABNORMAL HIGH (ref 11.4–15.2)

## 2020-03-14 LAB — APTT: aPTT: 37 seconds — ABNORMAL HIGH (ref 24–36)

## 2020-03-14 LAB — POC OCCULT BLOOD, ED: Fecal Occult Bld: NEGATIVE

## 2020-03-14 LAB — LACTIC ACID, PLASMA
Lactic Acid, Venous: 2.5 mmol/L (ref 0.5–1.9)
Lactic Acid, Venous: 1.6 mmol/L (ref 0.5–1.9)

## 2020-03-14 LAB — ACETAMINOPHEN LEVEL: Acetaminophen (Tylenol), Serum: <10 ug/mL — ABNORMAL LOW (ref 10–30)

## 2020-03-14 LAB — LIPASE, BLOOD: Lipase: 60 U/L — ABNORMAL HIGH (ref 11–51)

## 2020-03-14 LAB — BRAIN NATRIURETIC PEPTIDE: B Natriuretic Peptide: 59.4 pg/mL (ref 0.0–100.0)

## 2020-03-14 LAB — BILIRUBIN, DIRECT: Bilirubin, Direct: 6.8 mg/dL — ABNORMAL HIGH (ref 0.0–0.2)

## 2020-03-14 MED ORDER — SODIUM CHLORIDE 0.9 % IV BOLUS (SEPSIS)
1000.0000 mL | Freq: Once | INTRAVENOUS | Status: AC
  Administered 2020-03-14: 1000 mL via INTRAVENOUS

## 2020-03-14 MED ORDER — METRONIDAZOLE IN NACL 5-0.79 MG/ML-% IV SOLN
500.0000 mg | Freq: Once | INTRAVENOUS | Status: AC
  Administered 2020-03-14: 500 mg via INTRAVENOUS
  Filled 2020-03-14: qty 100

## 2020-03-14 MED ORDER — SODIUM CHLORIDE 0.9 % IV BOLUS: 1000.0000 mL | Freq: Once | INTRAVENOUS | Status: DC

## 2020-03-14 MED ORDER — SODIUM CHLORIDE (PF) 0.9 % IJ SOLN
INTRAMUSCULAR | Status: AC
  Administered 2020-03-15: 10 mL
  Filled 2020-03-14: qty 50

## 2020-03-14 MED ORDER — SODIUM CHLORIDE 0.9 % IV BOLUS (SEPSIS)
500.0000 mL | Freq: Once | INTRAVENOUS | Status: AC
  Administered 2020-03-15: 500 mL via INTRAVENOUS

## 2020-03-14 MED ORDER — SODIUM CHLORIDE 0.9 % IV SOLN
2.0000 g | Freq: Once | INTRAVENOUS | Status: AC
  Administered 2020-03-14: 2 g via INTRAVENOUS
  Filled 2020-03-14: qty 2

## 2020-03-14 MED ORDER — SODIUM CHLORIDE 0.9 % IV BOLUS (SEPSIS)
1000.0000 mL | Freq: Once | INTRAVENOUS | Status: AC
  Administered 2020-03-15: 1000 mL via INTRAVENOUS

## 2020-03-14 MED ORDER — ONDANSETRON HCL 4 MG/2ML IJ SOLN
4.0000 mg | Freq: Once | INTRAMUSCULAR | Status: AC
  Administered 2020-03-14: 4 mg via INTRAVENOUS
  Filled 2020-03-14: qty 2

## 2020-03-14 MED ORDER — VANCOMYCIN HCL 2000 MG/400ML IV SOLN
2000.0000 mg | Freq: Once | INTRAVENOUS | Status: AC
  Administered 2020-03-14: 2000 mg via INTRAVENOUS
  Filled 2020-03-14: qty 400

## 2020-03-14 MED ORDER — VANCOMYCIN HCL IN DEXTROSE 1-5 GM/200ML-% IV SOLN: 1000.0000 mg | Freq: Once | INTRAVENOUS | Status: DC

## 2020-03-14 MED ORDER — IOHEXOL 300 MG/ML  SOLN
100.0000 mL | Freq: Once | INTRAMUSCULAR | Status: AC | PRN
  Administered 2020-03-14: 100 mL via INTRAVENOUS

## 2020-03-14 NOTE — Progress Notes (Signed)
A consult was received from an ED physician for Vancomycin, cefepime per pharmacy dosing.  The patient's profile has been reviewed for ht/wt/allergies/indication/available labs.   A one time order has been placed for Vancomycin 2gm iv x1, and Cefepime 2gm iv x1.  Further antibiotics/pharmacy consults should be ordered by admitting physician if indicated.                       Thank you, Nani Skillern Crowford 03/14/2020  10:37 PM

## 2020-03-14 NOTE — ED Notes (Signed)
US at bedside

## 2020-03-14 NOTE — ED Triage Notes (Signed)
Pt BIB EMS from home. Pt reports dizziness today and has been very lethargic. Pts mother reports he was having burning with urination x2 weeks. Pt is significantly jaundice. Pt has hx of HIV and recent COVID. A&O x4.  CBG 106 98% RA Initially hypotensive 90/60 and came up to 140/90  18G LAC

## 2020-03-14 NOTE — H&P (Signed)
History and Physical    [name] [name] DOB: June 17, 1986 DOA: 03/14/2020  PCP: [name], MD   Patient coming from: Home   Chief Complaint: Dysuria, confusion   HPI: [name] is a 34 y.o. male with medical history significant for HIV, seizures, schizoaffective disorder, and admission with pneumonia due to COVID-19 in January, now presenting to the emergency department for evaluation of lethargy, dysuria, and confusion.  Patient is lethargic and disoriented in the emergency department, limiting his ability to provide a history though he does report burning with urination, denies abdominal or chest pain, and denies cough or shortness of breath.  He acknowledges ongoing daily alcohol use but is unable to quantify.  [name] reported to ED personnel that the patient had been complaining of dysuria for more than a week and then sounded confused on the phone today which prompted EMS to be called.  Patient had a blood pressure of 90/60 with with EMS that improved spontaneously.  ED Course: Upon arrival to the ED, patient is found to be afebrile, saturating low 90s on room air, slightly tachycardic, and with stable blood pressure.  EKG features sinus rhythm with QTc interval 515 ms.  Chest x-ray is negative for acute cardiopulmonary disease.  CT head is negative for acute intracranial abnormality.  CT abdomen/pelvis is notable for findings suggestive of cellulitis and possibly developing the abscess in the pubic area and bloating, as well as hepatomegaly and diffuse attenuation consistent with steatosis, and cholelithiasis without evidence for acute cholecystitis.  CT also suggestive of cystitis with ascending UTI.  No biliary dilatation or focal lesions noted on CT.  Right upper quadrant ultrasound reveals layering sludge in the gallbladder without acute cholecystitis and without any biliary dilatation.  Chemistry panel concerning for sodium 127, potassium 3.1, alkaline  phosphatase 187, albumin 2.3, AST "42, ALT 60, and total bilirubin 11.2.  CBC features a new leukocytosis to 12,900, worsened chronic anemia with hemoglobin of 8.4, and thrombocytopenia with platelets 111,000.  Fecal occult blood testing is negative, acetaminophen level undetectable, COVID-19 screening test also negative.  Lactic acid was elevated to 2.5, then down to 1.6 with fluids.  INR is 1.4.  Urinalysis compatible with infection.  Patient was given 30 cc/kg normal saline in the emergency department, blood cultures were collected, and he was treated with vancomycin, Flagyl, and cefepime.  Review of Systems:  ROS limited by patient's clinical condition with acute encephalopathy.  Past Medical History:  Diagnosis Date  . Bipolar 1 disorder ([place])   . Depression   . Dizziness and giddiness 02/01/2016  . Herpes genitalia   . HIV disease ([place])   . Hypertension   . Migraine headache 02/01/2016  . Peripheral neuropathy 10/01/2019  . PTSD (post-traumatic stress disorder)   . Schizoaffective disorder ([place])   . Seizures ([place])     Past Surgical History:  Procedure Laterality Date  . BACK SURGERY    . HAND SURGERY       reports that he has been smoking cigarettes. He has been smoking about 1.00 pack per day. He has never used smokeless tobacco. He reports current alcohol use of about 14.0 standard drinks of alcohol per week. He reports that he does not use drugs.  Allergies  Allergen Reactions  . Dapsone Other (See Comments)    Per centricity "G6PD deficient"  . Primaquine Phosphate Other (See Comments)    Per Centricity "G6PD deficient"    Family History  Problem Relation Age of Onset  .  Alcohol abuse Mother   . Schizophrenia Father   . Depression Father   . Alcohol abuse Father   . Alcohol abuse Paternal Uncle   . Alcohol abuse Paternal Uncle      Prior to Admission medications   Medication Sig Start Date End Date Taking? Authorizing Provider  albuterol (PROVENTIL  HFA;VENTOLIN HFA) 108 (90 BASE) MCG/ACT inhaler Inhale 2 puffs into the lungs every 6 (six) hours as needed for wheezing or shortness of breath.   Yes [provider]  alprazolam Duanne Moron) 2 MG tablet Take 2 mg by mouth 3 (three) times daily.   Yes [provider]  benzonatate (TESSALON) 100 MG capsule Take 200 mg by mouth every 8 (eight) hours as needed for cough. 10/23/19  Yes [provider]  bictegravir-emtricitabine-tenofovir AF (BIKTARVY) 50-200-25 MG TABS tablet Take 1 tablet by mouth daily.  02/24/17  Yes [provider]  famotidine (PEPCID) 40 MG tablet Take 40 mg by mouth daily.   Yes [provider]  FLUoxetine (PROZAC) 40 MG capsule Take 40 mg by mouth daily.   Yes [provider]  folic acid (FOLVITE) 1 MG tablet Take 1 mg by mouth daily. 10/03/19  Yes [provider]  furosemide (LASIX) 40 MG tablet Take 40 mg by mouth daily.  02/27/20  Yes [provider]  haloperidol (HALDOL) 5 MG tablet Take 5 mg by mouth daily.  06/19/19  Yes [provider]  hydrOXYzine (ATARAX/VISTARIL) 50 MG tablet Take 1 tablet (50 mg total) by mouth every 6 (six) hours as needed for anxiety. 07/25/18  Yes Pennelope Bracken, MD  linaclotide Rolan Lipa) 145 MCG CAPS capsule Take 145 mcg by mouth daily as needed (constipation).   Yes [provider]  lurasidone 120 MG TABS Take 1 tablet (120 mg total) by mouth daily. 05/15/15  Yes Niel Hummer, NP  meclizine (ANTIVERT) 25 MG tablet Take 25 mg by mouth every 8 (eight) hours as needed for dizziness.  03/02/20  Yes [provider]  ondansetron (ZOFRAN) 8 MG tablet Take 8 mg by mouth every 8 (eight) hours as needed for nausea/vomiting. 10/03/19  Yes [provider]  pantoprazole (PROTONIX) 40 MG tablet Take 40 mg by mouth every morning. 03/02/20  Yes [provider]  promethazine (PHENERGAN) 12.5 MG tablet Take 12.5 mg by mouth 2 (two) times daily as needed  for nausea or vomiting.  03/02/20  Yes [provider]  thiamine (VITAMIN B-1) 100 MG tablet Take 100 mg by mouth daily.  01/31/19  Yes [provider]  topiramate (TOPAMAX) 100 MG tablet Take 1 tablet (100 mg total) by mouth at bedtime. 02/17/20  Yes Suzzanne Cloud, NP  traZODone (DESYREL) 300 MG tablet Take 1 tablet (300 mg total) by mouth at bedtime as needed for sleep. Patient taking differently: Take 300 mg by mouth at bedtime.  05/15/15  Yes Niel Hummer, NP  valACYclovir (VALTREX) 1000 MG tablet Take 1 tablet (1,000 mg total) by mouth daily. 05/15/15  Yes Niel Hummer, NP  Vitamin D, Ergocalciferol, (DRISDOL) 1.25 MG (50000 UNIT) CAPS capsule Take 50,000 Units by mouth once a week. 10/03/19  Yes [provider]  zolpidem (AMBIEN) 10 MG tablet Take 10 mg by mouth at bedtime. 09/06/19  Yes [provider]  cetirizine (ZYRTEC) 10 MG tablet Take 1 tablet (10 mg total) by mouth daily. Patient not taking: Reported on 03/14/2020 05/15/15   Niel Hummer, NP  sulfamethoxazole-trimethoprim (BACTRIM DS) 800-160 MG  tablet Take 1 tablet by mouth every 12 (twelve) hours. Patient not taking: Reported on 03/14/2020 11/02/19   Patrecia Pour, MD    Physical Exam: Vitals:   03/14/20 2200 03/14/20 2207 03/14/20 2230 03/14/20 2245  BP: 132/69  (!) 134/95 131/86  Pulse: 97  (!) 103 99  Resp: (!) 24  20 (!) 22  Temp:  99.8 F (37.7 C)    TempSrc:  Rectal    SpO2: 96%  97% 97%    Constitutional: No respiratory distress, lethargic, no diaphoresis   Eyes: PERTLA, lids and conjunctivae normal ENMT: Mucous membranes are moist. Posterior pharynx clear of any exudate or lesions.   Neck: normal, supple, no masses, no thyromegaly Respiratory: no wheezing, no crackles. No accessory muscle use, pallor or cyanosis.  Cardiovascular: S1 & S2 heard, regular rate and rhythm. No extremity edema.   Abdomen: No distension, no tenderness, soft. Bowel sounds active.  Musculoskeletal: no  clubbing / cyanosis. No joint deformity upper and lower extremities.   Skin: Folliculitis involving pubic region and groin with some areas of induration and spontaneous drainage of scant purulent material. Otherwise warm, dry, well-perfused. Neurologic: No gross facial asymmetry, PERRL. Sensation to light touch intact. Moving all extremities.  Psychiatric: Sleeping, wakes to voice but is lethargic. Oriented to person, place, and situation but does not answer when asked for month or year.      Labs and Imaging on Admission: I have personally reviewed following labs and imaging studies  CBC: Recent Labs  Lab 03/14/20 1947  WBC 12.9*  NEUTROABS 10.3*  HGB 8.4*  HCT 26.3*  MCV 106.9*  PLT 382*   Basic Metabolic Panel: Recent Labs  Lab 03/14/20 1947  NA 127*  K 3.1*  CL 89*  CO2 22  GLUCOSE 92  BUN 10  CREATININE 0.94  CALCIUM 8.8*   GFR: CrCl cannot be calculated (Unknown ideal weight.). Liver Function Tests: Recent Labs  Lab 03/14/20 1947  AST 442*  ALT 116*  ALKPHOS 187*  BILITOT 11.2*  PROT 7.8  ALBUMIN 2.3*   Recent Labs  Lab 03/14/20 1947  LIPASE 60*   Recent Labs  Lab 03/14/20 1947  AMMONIA 29   Coagulation Profile: Recent Labs  Lab 03/14/20 2233  INR 1.4*   Cardiac Enzymes: No results for input(s): CKTOTAL, CKMB, CKMBINDEX, TROPONINI in the last 168 hours. BNP (last 3 results) No results for input(s): PROBNP in the last 8760 hours. HbA1C: No results for input(s): HGBA1C in the last 72 hours. CBG: No results for input(s): GLUCAP in the last 168 hours. Lipid Profile: No results for input(s): CHOL, HDL, LDLCALC, TRIG, CHOLHDL, LDLDIRECT in the last 72 hours. Thyroid Function Tests: No results for input(s): TSH, T4TOTAL, FREET4, T3FREE, THYROIDAB in the last 72 hours. Anemia Panel: No results for input(s): VITAMINB12, FOLATE, FERRITIN, TIBC, IRON, RETICCTPCT in the last 72 hours. Urine analysis:    Component Value Date/Time   COLORURINE  AMBER (A) 03/14/2020 1947   APPEARANCEUR CLOUDY (A) 03/14/2020 1947   LABSPEC 1.011 03/14/2020 1947   PHURINE 6.0 03/14/2020 1947   GLUCOSEU NEGATIVE 03/14/2020 1947   HGBUR LARGE (A) 03/14/2020 1947   BILIRUBINUR MODERATE (A) 03/14/2020 1947   KETONESUR 20 (A) 03/14/2020 1947   PROTEINUR 100 (A) 03/14/2020 1947   UROBILINOGEN 0.2 12/21/2014 1006   NITRITE NEGATIVE 03/14/2020 1947   LEUKOCYTESUR MODERATE (A) 03/14/2020 1947   Sepsis Labs: @LABRCNTIP (procalcitonin:4,lacticidven:4) )No results found for this or any previous visit (from the past 240 hour(s)).  Radiological Exams on Admission: CT Head Wo Contrast  Result Date: 03/14/2020 CLINICAL DATA:  Dizziness and lethargy. EXAM: CT HEAD WITHOUT CONTRAST TECHNIQUE: Contiguous axial images were obtained from the base of the skull through the vertex without intravenous contrast. COMPARISON:  October 27, 2019 FINDINGS: Brain: No evidence of acute infarction, hemorrhage, hydrocephalus, extra-axial collection or mass lesion/mass effect. There is mild bilateral basal ganglia calcification. Vascular: No hyperdense vessel or unexpected calcification. Skull: Normal. Negative for fracture or focal lesion. Sinuses/Orbits: No acute finding. Other: None. IMPRESSION: No acute intracranial pathology. Electronically Signed   By: Virgina Norfolk M.D.   On: 03/14/2020 22:14   CT ABDOMEN PELVIS W CONTRAST  Result Date: 03/14/2020 CLINICAL DATA:  Jaundice, burning urination, history of HIV and COVID-19 EXAM: CT ABDOMEN AND PELVIS WITH CONTRAST TECHNIQUE: Multidetector CT imaging of the abdomen and pelvis was performed using the standard protocol following bolus administration of intravenous contrast. CONTRAST:  154m OMNIPAQUE IOHEXOL 300 MG/ML  SOLN COMPARISON:  MRI 08/27/2018, ultrasound 08/01/2018 FINDINGS: Lower chest: Few patchy multifocal ground-glass opacities in the lung bases. No effusion. Normal heart size. No pericardial effusion. Hepatobiliary: Mild  hepatomegaly with diffuse hepatic hypoattenuation sparing along the gallbladder fossa most compatible with hepatic steatosis seen on comparison MR. Smooth liver surface contour. No focal liver lesion. Layering gallstones within the otherwise normal gallbladder. No visible intraductal gallstones or biliary dilatation. Pancreas: There is some questionable stranding versus motion artifact near the body and tail of the pancreas. No focal concerning lesion. No duct dilatation. Spleen: Normal in size without focal abnormality. Adrenals/Urinary Tract: Normal adrenal glands. Kidneys enhance symmetrically. No concerning renal lesion. No urolithiasis or hydronephrosis. Some mild urothelial thickening of the ureters is noted. There is circumferential bladder wall thickening with mucosal hyperemia and small focus of intraluminal gas. Stomach/Bowel: Distal esophagus, stomach and duodenal sweep are unremarkable. No small bowel wall thickening or dilatation. No evidence of obstruction. A normal appendix is visualized. No colonic dilatation or wall thickening. Vascular/Lymphatic: Atherosclerotic calcifications throughout the abdominal aorta and branch vessels. No aneurysm or ectasia. Several borderline enlarged and possibly reactive nodes are present in the inguinal chains. No other enlarged lymph nodes. Reproductive: The prostate and seminal vesicles are unremarkable. Included portions of the external genitalia are unremarkable. Other: Multiple areas of skin thickening and subcutaneous stranding as well as possible developing rim enhancing collections in the subcutaneous soft tissues of the anterior mons pubis including a collection measuring 1.8 x 2.0 x 1.7 cm. Stranding and inflammation from this location extends to the base of the penile shaft. Additional areas of focal skin thickening noted along the right gluteal cleft and anterior thigh soft tissues. Small fat containing umbilical hernia. No abdominopelvic free air or fluid.  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 No other acute or suspicious osseous lesions. IMPRESSION: 1. Multiple areas of skin thickening and subcutaneous stranding as well as possible developing rim enhancing collections in the subcutaneous soft tissues, most pronounced of the anterior mons pubis including a subcutaneous collection measuring 1.8 x 2.0 x 1.7 cm. Stranding and inflammation from this location extends to the base of the penile shaft. Additional areas of focal skin thickening noted along the right gluteal cleft and anterior thigh soft tissues. Findings are concerning for cellulitis with possible developing abscesses. Recommend correlation with direct visual inspection. 2. Circumferential bladder wall thickening with mucosal hyperemia and small focus of intraluminal gas. Findings are  concerning for cystitis with ascending urinary tract infection. 3. 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 4. Few patchy multifocal ground-glass opacities in the lung bases compatible with COVID-19 pneumonia. 5. Mild hepatomegaly with diffuse hepatic hypoattenuation sparing along the gallbladder fossa most compatible with hepatic steatosis seen on comparison MR. 6. Cholelithiasis without evidence of acute cholecystitis. If there is persisting clinical concern, correlate with right upper quadrant ultrasound. 7. Aortic Atherosclerosis (ICD10-I70.0). These results were called by telephone at the time of interpretation on 03/14/2020 at 10:20 pm to provider [facility] , who verbally acknowledged these results. Electronically Signed   By: Lovena Le M.D.   On: 03/14/2020 22:20   US Abdomen Limited  Result Date: 03/14/2020 CLINICAL DATA:  Transaminitis.  Gallstone. EXAM: ULTRASOUND ABDOMEN LIMITED  RIGHT UPPER QUADRANT COMPARISON:  Abdominal CT earlier today. Abdominal ultrasound 08/01/2018. FINDINGS: Gallbladder: Physiologically distended. Layering sludge but no evidence of shadowing stone. No wall thickening or pericholecystic fluid. No sonographic Murphy sign noted by sonographer. Common bile duct: Diameter: 6 mm. Liver: No focal lesion identified. Diffusely increased and heterogeneous in parenchymal echogenicity. Portal vein is patent on color Doppler imaging with normal direction of blood flow towards the liver. Other: No right upper quadrant ascites. IMPRESSION: 1. Layering sludge in the gallbladder without stones or sonographic findings of acute cholecystitis. No biliary dilatation. 2. Diffusely increased and heterogeneous parenchymal echogenicity consistent with steatosis. Electronically Signed   By: Keith Rake M.D.   On: 03/14/2020 23:25   DG Chest Port 1 View  Result Date: 03/14/2020 CLINICAL DATA:  Dizziness and lethargy. EXAM: PORTABLE CHEST 1 VIEW COMPARISON:  October 27, 2019 FINDINGS: There is no evidence of acute infiltrate, pleural effusion or pneumothorax. There is mild, stable elevation of the right hemidiaphragm. The heart size and mediastinal contours are within normal limits. The visualized skeletal structures are unremarkable. IMPRESSION: No active disease. Electronically Signed   By: Virgina Norfolk M.D.   On: 03/14/2020 21:33    EKG: Independently reviewed. Sinus rhythm, QTc 515 ms.   Assessment/Plan   1. Sepsis secondary to UTI, cellulitis  - Presents with dysuria, lethargy, and confusion and is found to have new leukocytosis, elevated lactate, UA compatible with infection, and CT-findings concerning for cystitis with ascending infection and cellulitis with possible developing abscess involving pubic area and groin  - Blood and urine cultures collected in ED, 30 cc/kg NS bolus was given, and vancomycin, cefepime, and Flagyl were administered  - On exam, there is  folliculitis in the pubic region and groin with areas of induration and scant purulent drainage  - Continue empiric antibiotics with vancomycin and cefepime, follow cultures and clinical response to therapy    2. Hepatitis  - Presents with dysuria and confusion and is found to have alk phos 187, AST 442, ALT 116, and total bili 11.2 with hepatomegaly and steatosis on imaging but no biliary dilatation or focal lesion  - Ammonia is normal, INR 1.4  - APAP level <10 in ED  - With hx of alcohol dependence, alcoholic hepatitis is considered (Maddrey discriminant fxn score 31); medication effect, autoimmune, or viral etiologies also considered  - Check viral hepatitis panel, hold potentially offending medications, continue supportive care, trend LFTs    3. Acute encephalopathy  - Patient reports >1 wk dysuria and was noted to be confused and lethargic by family  - Head CT negative for acute findings, ammonia level  is normal  - Likely related to sepsis and/or electrolyte derangements, anticipate improvement with IVF, antibiotics, holding sedating medications, and correction of electrolytes    4. HIV  - CD4 was 790 and VL undetectable in January 2021  - Biktarvy held on admission in light of hepatomegaly and acutely elevated LFTs, may need to discuss with ID and/or GI prior to resuming    5. Anemia, thrombocytopenia  - Hgb is 8.4, down from 10.7 in January with negative FOBT in ED and no apparent source of blood-loss  - Platelets 111k on admission, intermittently low in past  - Type and screen, check anemia panel, monitor, transfuse as needed    6. Hyponatremia   - Serum sodium was 127 in ED in setting of jaundice and hypovolemia  - He was given 3.5 liters of saline in ED  - Check serum osm, repeat serum chemistry panel   7. Hypokalemia  - Serum potassium is 3.1 in ED  - Replace, repeat chemistries in am   8. Alcohol dependence  - Does not appear to be intoxicated or withdrawing on  admission, acknowledges daily EtOH use  - Monitor with CIWA, use Ativan as needed, and supplement vitamins    9. Schizoaffective disorder  - Hold potentially sedating medications given lethargy and confusion    10. Prolonged QT interval  - QT interval is 515 ms on admission  - Continue cardiac monitoring, minimize QT-prolonging medications, replace potassium to 4 and mag to 2, repeat chem panel in am      DVT prophylaxis: SCDs  Code Status: Full  Family Communication: Discussed with patient  Disposition Plan:  Patient is from: Home  Anticipated d/c is to: TBD Anticipated d/c date is: 03/19/20 Patient currently: Critically ill  Consults called: None  Admission status: Inpatient     [name], MD [facility] Pager: See [email or web address]  If 7AM-7PM, please contact the daytime attending [email or web address]  03/14/2020, 11:53 PM

## 2020-03-14 NOTE — ED Notes (Signed)
x2 unsuccessful attempts at obtaining second set of cultures and getting second IV

## 2020-03-14 NOTE — ED Provider Notes (Signed)
[facility] DEPT Provider Note   CSN: [id number] Arrival date & time: 03/14/20  1926     History Chief Complaint  Patient presents with  . Weakness  . Jaundice    [name] is a 34 y.o. male.  The history is provided by the patient and medical records. No language interpreter was used.  Weakness    34 year old male with history of HIV, heavy alcohol use, schizophrenia, bipolar, depression, brought here via EMS from home for complaints of generalized weakness.  History is difficult due to patient's current state of health.  Per EMS, patient report being very lethargic and he also has had urinary discomfort for the past 2 weeks.  He recently diagnosed with COVID-19 in January of this year.  Patient report for the past 3 to 4 days he has extreme fatigue unable to ambulate.  Does admits to alcohol use, last drink was yesterday.  Denies any significant headache or chest pain or trouble breathing but does endorse some abdominal discomfort and urinary discomfort.  Unsure if he has any nausea or vomiting.  The rest of the history is limited as patient appears altered and having difficulty finishing his sentence.  Level 5 caveat due to altered mental status.  Past Medical History:  Diagnosis Date  . Bipolar 1 disorder ([place])   . Depression   . Dizziness and giddiness 02/01/2016  . Herpes genitalia   . HIV disease ([place])   . Hypertension   . Migraine headache 02/01/2016  . Peripheral neuropathy 10/01/2019  . PTSD (post-traumatic stress disorder)   . Schizoaffective disorder ([place])   . Seizures [facility])     Patient Active Problem List   Diagnosis Date Noted  . Acute respiratory failure due to COVID-19 ([place]) 10/27/2019  . GERD (gastroesophageal reflux disease) 10/27/2019  . Hypokalemia 10/27/2019  . Peripheral neuropathy 10/01/2019  . PTSD (post-traumatic stress disorder) 07/23/2018  . Dizziness and giddiness 02/01/2016  . Migraine headache 02/01/2016  .  Schizoaffective disorder, depressive type ([place]) 05/13/2015  . Severe alcohol dependence ([place]) 05/13/2015  . Suicidal ideation 01/12/2014    Past Surgical History:  Procedure Laterality Date  . BACK SURGERY    . HAND SURGERY         Family History  Problem Relation Age of Onset  . Alcohol abuse Mother   . Schizophrenia Father   . Depression Father   . Alcohol abuse Father   . Alcohol abuse Paternal Uncle   . Alcohol abuse Paternal Uncle     Social History   Tobacco Use  . Smoking status: Current Every Day Smoker    Packs/day: 1.00    Types: Cigarettes  . Smokeless tobacco: Never Used  Substance Use Topics  . Alcohol use: Yes    Alcohol/week: 14.0 standard drinks    Types: 14 Cans of beer per week    Comment: 5-6 40oz per day  . Drug use: No    Home Medications Prior to Admission medications   Medication Sig Start Date End Date Taking? Authorizing Provider  albuterol (PROVENTIL HFA;VENTOLIN HFA) 108 (90 BASE) MCG/ACT inhaler Inhale 2 puffs into the lungs every 6 (six) hours as needed for wheezing or shortness of breath.    [provider]  alprazolam Duanne Moron) 2 MG tablet Take 2 mg by mouth 3 (three) times daily.    [provider]  benzonatate (TESSALON) 100 MG capsule Take 200 mg by mouth every 8 (eight) hours as needed for cough. 10/23/19  [provider]  bictegravir-emtricitabine-tenofovir AF (BIKTARVY) 50-200-25 MG TABS tablet Take 1 tablet by mouth daily.  02/24/17   [provider]  cetirizine (ZYRTEC) 10 MG tablet Take 1 tablet (10 mg total) by mouth daily. 05/15/15   Niel Hummer, NP  famotidine (PEPCID) 40 MG tablet Take 40 mg by mouth daily.    [provider]  FLUoxetine (PROZAC) 40 MG capsule Take 40 mg by mouth daily.    [provider]  folic acid (FOLVITE) 1 MG tablet Take 1 mg by mouth daily. 10/03/19   [provider]  haloperidol (HALDOL) 5 MG tablet Take 5 mg by mouth 2 (two) times daily.  06/19/19   [provider]  hydrOXYzine (ATARAX/VISTARIL) 50 MG tablet Take 1 tablet (50 mg total) by mouth every 6 (six) hours as needed for anxiety. 07/25/18   Pennelope Bracken, MD  linaclotide (LINZESS) 145 MCG CAPS capsule Take 145 mcg by mouth daily as needed (constipation).    [provider]  lurasidone 120 MG TABS Take 1 tablet (120 mg total) by mouth daily. 05/15/15   Niel Hummer, NP  ondansetron (ZOFRAN) 8 MG tablet Take 8 mg by mouth every 8 (eight) hours as needed for nausea/vomiting. 10/03/19   [provider]  sulfamethoxazole-trimethoprim (BACTRIM DS) 800-160 MG tablet Take 1 tablet by mouth every 12 (twelve) hours. 11/02/19   Patrecia Pour, MD  thiamine (VITAMIN B-1) 100 MG tablet Take 100 mg by mouth daily.  01/31/19   [provider]  topiramate (TOPAMAX) 100 MG tablet Take 1 tablet (100 mg total) by mouth at bedtime. 02/17/20   Suzzanne Cloud, NP  traZODone (DESYREL) 300 MG tablet Take 1 tablet (300 mg total) by mouth at bedtime as needed for sleep. Patient taking differently: Take 300 mg by mouth at bedtime.  05/15/15   Niel Hummer, NP  valACYclovir (VALTREX) 1000 MG tablet Take 1 tablet (1,000 mg total) by mouth daily. 05/15/15   Niel Hummer, NP  Vitamin D, Ergocalciferol, (DRISDOL) 1.25 MG (50000 UNIT) CAPS capsule Take 50,000 Units by mouth once a week. 10/03/19   [provider]  zolpidem (AMBIEN) 10 MG tablet Take 10 mg by mouth at bedtime. 09/06/19   [provider]    Allergies    Dapsone and Primaquine phosphate  Review of Systems   Review of Systems  Unable to perform ROS: Mental status change  Neurological: Positive for weakness.    Physical Exam Updated Vital Signs BP 131/76   Pulse 67   Temp 98.8 F (37.1 C) (Oral)   Resp 14   SpO2 92%   Physical Exam Vitals and nursing note reviewed. Exam conducted with a chaperone present.  Constitutional:      Appearance: He is ill-appearing.      Comments: Patient is ill-appearing, drowsy, slurred speech, and jaundice in appearance  HENT:     Head: Normocephalic and atraumatic.     Mouth/Throat:     Mouth: Mucous membranes are dry.  Eyes:     General: Scleral icterus present.     Extraocular Movements: Extraocular movements intact.     Pupils: Pupils are equal, round, and reactive to light.  Cardiovascular:     Rate and Rhythm: Normal rate and regular rhythm.     Pulses: Normal pulses.     Heart sounds: Normal heart sounds.  Pulmonary:     Breath sounds: Normal breath sounds. No wheezing, rhonchi or rales.  Abdominal:  Palpations: Abdomen is soft.     Tenderness: There is abdominal tenderness (Diffusely tender without guarding or rebound tenderness.  Multiple bruising noted in the abdomen).  Musculoskeletal:        General: Swelling (Moderate edema noted to left lower extremity compared to right.) present.     Cervical back: Neck supple. No rigidity.  Skin:    General: Skin is warm.     Coloration: Skin is jaundiced.  Neurological:     Mental Status: He is disoriented.     GCS: GCS eye subscore is 3. GCS verbal subscore is 4. GCS motor subscore is 6.     Cranial Nerves: No cranial nerve deficit.     Sensory: Sensation is intact.     Motor: Weakness present.     ED Results / Procedures / Treatments   Labs (all labs ordered are listed, but only abnormal results are displayed) Labs Reviewed  CBC WITH DIFFERENTIAL/PLATELET - Abnormal; Notable for the following components:      Result Value   WBC 12.9 (*)    RBC 2.46 (*)    Hemoglobin 8.4 (*)    HCT 26.3 (*)    MCV 106.9 (*)    MCH 34.1 (*)    RDW 18.7 (*)    Platelets 111 (*)    Neutro Abs 10.3 (*)    Monocytes Absolute 1.4 (*)    Abs Immature Granulocytes 0.23 (*)    All other components within normal limits  COMPREHENSIVE METABOLIC PANEL - Abnormal; Notable for the following components:   Sodium 127 (*)    Potassium 3.1 (*)    Chloride 89 (*)    Calcium  8.8 (*)    Albumin 2.3 (*)    AST 442 (*)    ALT 116 (*)    Alkaline Phosphatase 187 (*)    Total Bilirubin 11.2 (*)    Anion gap 16 (*)    All other components within normal limits  LIPASE, BLOOD - Abnormal; Notable for the following components:   Lipase 60 (*)    All other components within normal limits  URINALYSIS, ROUTINE W REFLEX MICROSCOPIC - Abnormal; Notable for the following components:   Color, Urine AMBER (*)    APPearance CLOUDY (*)    Hgb urine dipstick LARGE (*)    Bilirubin Urine MODERATE (*)    Ketones, ur 20 (*)    Protein, ur 100 (*)    Leukocytes,Ua MODERATE (*)    RBC / HPF >50 (*)    WBC, UA >50 (*)    Bacteria, UA MANY (*)    All other components within normal limits  RAPID URINE DRUG SCREEN, HOSP PERFORMED - Abnormal; Notable for the following components:   Benzodiazepines POSITIVE (*)    All other components within normal limits  LACTIC ACID, PLASMA - Abnormal; Notable for the following components:   Lactic Acid, Venous 2.5 (*)    All other components within normal limits  BILIRUBIN, DIRECT - Abnormal; Notable for the following components:   Bilirubin, Direct 6.8 (*)    All other components within normal limits  ACETAMINOPHEN LEVEL - Abnormal; Notable for the following components:   Acetaminophen (Tylenol), Serum <10 (*)    All other components within normal limits  SARS CORONAVIRUS 2 BY RT PCR (HOSPITAL ORDER, [facility])  CULTURE, BLOOD (ROUTINE X 2)  CULTURE, BLOOD (ROUTINE X 2)  URINE CULTURE  ETHANOL  LACTIC ACID, PLASMA  AMMONIA  BRAIN NATRIURETIC PEPTIDE  HEPATITIS PANEL, ACUTE  APTT  PROTIME-INR  POC OCCULT BLOOD, ED    EKG EKG Interpretation  Date/Time:  Saturday March 14 2020 19:37:46 EDT Ventricular Rate:  97 PR Interval:    QRS Duration: 99 QT Interval:  405 QTC Calculation: 515 R Axis:   41 Text Interpretation: Sinus rhythm Abnormal T, consider ischemia, anterior leads Prolonged QT interval  prolonged QT unchanged from previous Confirmed by Wandra Arthurs [phone]) on 03/14/2020 7:50:25 PM   Radiology CT Head Wo Contrast  Result Date: 03/14/2020 CLINICAL DATA:  Dizziness and lethargy. EXAM: CT HEAD WITHOUT CONTRAST TECHNIQUE: Contiguous axial images were obtained from the base of the skull through the vertex without intravenous contrast. COMPARISON:  October 27, 2019 FINDINGS: Brain: No evidence of acute infarction, hemorrhage, hydrocephalus, extra-axial collection or mass lesion/mass effect. There is mild bilateral basal ganglia calcification. Vascular: No hyperdense vessel or unexpected calcification. Skull: Normal. Negative for fracture or focal lesion. Sinuses/Orbits: No acute finding. Other: None. IMPRESSION: No acute intracranial pathology. Electronically Signed   By: Virgina Norfolk M.D.   On: 03/14/2020 22:14   CT ABDOMEN PELVIS W CONTRAST  Result Date: 03/14/2020 CLINICAL DATA:  Jaundice, burning urination, history of HIV and COVID-19 EXAM: CT ABDOMEN AND PELVIS WITH CONTRAST TECHNIQUE: Multidetector CT imaging of the abdomen and pelvis was performed using the standard protocol following bolus administration of intravenous contrast. CONTRAST:  117m OMNIPAQUE IOHEXOL 300 MG/ML  SOLN COMPARISON:  MRI 08/27/2018, ultrasound 08/01/2018 FINDINGS: Lower chest: Few patchy multifocal ground-glass opacities in the lung bases. No effusion. Normal heart size. No pericardial effusion. Hepatobiliary: Mild hepatomegaly with diffuse hepatic hypoattenuation sparing along the gallbladder fossa most compatible with hepatic steatosis seen on comparison MR. Smooth liver surface contour. No focal liver lesion. Layering gallstones within the otherwise normal gallbladder. No visible intraductal gallstones or biliary dilatation. Pancreas: There is some questionable stranding versus motion artifact near the body and tail of the pancreas. No focal concerning lesion. No duct dilatation. Spleen: Normal in size  without focal abnormality. Adrenals/Urinary Tract: Normal adrenal glands. Kidneys enhance symmetrically. No concerning renal lesion. No urolithiasis or hydronephrosis. Some mild urothelial thickening of the ureters is noted. There is circumferential bladder wall thickening with mucosal hyperemia and small focus of intraluminal gas. Stomach/Bowel: Distal esophagus, stomach and duodenal sweep are unremarkable. No small bowel wall thickening or dilatation. No evidence of obstruction. A normal appendix is visualized. No colonic dilatation or wall thickening. Vascular/Lymphatic: Atherosclerotic calcifications throughout the abdominal aorta and branch vessels. No aneurysm or ectasia. Several borderline enlarged and possibly reactive nodes are present in the inguinal chains. No other enlarged lymph nodes. Reproductive: The prostate and seminal vesicles are unremarkable. Included portions of the external genitalia are unremarkable. Other: Multiple areas of skin thickening and subcutaneous stranding as well as possible developing rim enhancing collections in the subcutaneous soft tissues of the anterior mons pubis including a collection measuring 1.8 x 2.0 x 1.7 cm. Stranding and inflammation from this location extends to the base of the penile shaft. Additional areas of focal skin thickening noted along the right gluteal cleft and anterior thigh soft tissues. Small fat containing umbilical hernia. No abdominopelvic free air or fluid. 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 No other acute or suspicious osseous lesions. IMPRESSION: 1. Multiple areas of skin thickening and subcutaneous stranding as well as possible developing rim enhancing collections in the subcutaneous soft tissues, most pronounced  of the anterior mons pubis including a subcutaneous collection measuring 1.8 x 2.0 x 1.7 cm. Stranding  and inflammation from this location extends to the base of the penile shaft. Additional areas of focal skin thickening noted along the right gluteal cleft and anterior thigh soft tissues. Findings are concerning for cellulitis with possible developing abscesses. Recommend correlation with direct visual inspection. 2. Circumferential bladder wall thickening with mucosal hyperemia and small focus of intraluminal gas. Findings are concerning for cystitis with ascending urinary tract infection. 3. 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 4. Few patchy multifocal ground-glass opacities in the lung bases compatible with COVID-19 pneumonia. 5. Mild hepatomegaly with diffuse hepatic hypoattenuation sparing along the gallbladder fossa most compatible with hepatic steatosis seen on comparison MR. 6. Cholelithiasis without evidence of acute cholecystitis. If there is persisting clinical concern, correlate with right upper quadrant ultrasound. 7. Aortic Atherosclerosis (ICD10-I70.0). These results were called by telephone at the time of interpretation on 03/14/2020 at 10:20 pm to provider [facility] , who verbally acknowledged these results. Electronically Signed   By: Lovena Le M.D.   On: 03/14/2020 22:20   DG Chest Port 1 View  Result Date: 03/14/2020 CLINICAL DATA:  Dizziness and lethargy. EXAM: PORTABLE CHEST 1 VIEW COMPARISON:  October 27, 2019 FINDINGS: There is no evidence of acute infiltrate, pleural effusion or pneumothorax. There is mild, stable elevation of the right hemidiaphragm. The heart size and mediastinal contours are within normal limits. The visualized skeletal structures are unremarkable. IMPRESSION: No active disease. Electronically Signed   By: Virgina Norfolk M.D.   On: 03/14/2020 21:33    Procedures .Critical Care Performed by: Domenic Moras, PA-C Authorized by: Domenic Moras, PA-C    Critical care provider statement:    Critical care time (minutes):  65   Critical care was time spent personally by me on the following activities:  Discussions with consultants, evaluation of patient's response to treatment, examination of patient, ordering and performing treatments and interventions, ordering and review of laboratory studies, ordering and review of radiographic studies, pulse oximetry, re-evaluation of patient's condition, obtaining history from patient or surrogate and review of old charts   (including critical care time)  Medications Ordered in ED Medications  sodium chloride (PF) 0.9 % injection (has no administration in time range)  sodium chloride 0.9 % bolus 1,000 mL (1,000 mLs Intravenous New Bag/Given (Non-Interop) 03/14/20 2256)    And  sodium chloride 0.9 % bolus 1,000 mL (has no administration in time range)    And  sodium chloride 0.9 % bolus 1,000 mL (has no administration in time range)    And  sodium chloride 0.9 % bolus 500 mL (has no administration in time range)  ceFEPIme (MAXIPIME) 2 g in sodium chloride 0.9 % 100 mL IVPB (2 g Intravenous New Bag/Given (Non-Interop) 03/14/20 2256)  metroNIDAZOLE (FLAGYL) IVPB 500 mg (has no administration in time range)  vancomycin (VANCOREADY) IVPB 2000 mg/400 mL (has no administration in time range)  ondansetron (ZOFRAN) injection 4 mg (4 mg Intravenous Given 03/14/20 2207)  iohexol (OMNIPAQUE) 300 MG/ML solution 100 mL (100 mLs Intravenous Contrast Given 03/14/20 2128)    ED Course  I have reviewed the triage vital signs and the nursing notes.  Pertinent labs & imaging results that were available during my care of the patient were reviewed by me and considered in my medical decision making (see chart for details).  MDM Rules/Calculators/A&P                      BP 131/86   Pulse 99   Temp 99.8 F (37.7 C) (Rectal)   Resp (!) 22   SpO2 97%   Final Clinical Impression(s) / ED Diagnoses Final diagnoses:   Sepsis, due to unspecified organism, unspecified whether acute organ dysfunction present ([place])  Upper urinary tract infection  Transaminitis  Anemia, unspecified type  Edema of left lower extremity    Rx / DC Orders ED Discharge Orders    None     8:00 PM Patient with significant history of alcohol abuse, HIV, previously diagnosed with COVID-19 approximately 5 months ago who brought today due to generalized weakness.  He is globally weak, altered, and jaundiced in appearance.  He is ill-appearing.  He has multiple ecchymosis noted to body.  He has left leg edema unable to obtain DVT study at this time however he would benefit from having one once he is being admitted.  He has a tender abdomen.  Work-up initiated. Suspect alcohol induced pancreatitis.   Labs remarkable for elevated white count of 12.9.  Hemoglobin is 8.4, lower than baseline, platelets is 111 also lower than baseline, a sodium of 127, potassium of 3.1, chloride is 89.  Patient has evidence of transaminitis with AST 442, ALT 116, alk phos 187 and a total bili of 11.2.  This is markedly elevated from his baseline.  Mildly elevated lipase of 60.  Urinalysis is pending.  UDS is positive for benzodiazepine.  His initial lactic acid is 2.5.  Normal ammonia level, direct bilirubin is 6.8.  Hepatitis panel is currently pending.  Abdominal pelvis CT scan obtained showing multiple findings.  There are multiple area of skin thickening and subcutaneous stranding as well as developing rim-enhancing collection in the subcutaneous soft tissue most pronounced in the anterior mons pubis with stranding extending towards the penile shaft.  Additional area of focal skin thickening at the right gluteal cleft and anterior thigh soft tissue.  Finding concerning for cellulitis or possible developing abscess.  I did examine patient's mon pubis and gluteal region with Dr. Darl Householder.  No obvious cellulitic skin changes aside from chronic follicular skin lesions.   Area nontender.  Uncircumcised penis.    Significant bladder wall thickening with mucosal hyperemia and small focus of intraluminal gas.  Findings are concerning for cystitis with ascending urinary tract infection.  Patient has been complaining of dysuria for the past week.  UA is currently pending.  There are few patchy multifocal groundglass opacity in the lung base compatible with COVID-19 pneumonia.  History reviewed patient was diagnosed with COVID-19 pneumonia in January of this year.  This is likely a residual finding.  He does have some wheezing and rhonchi on exam.  Mild hepatomegaly with diffuse hepatic hypoattenuation sparing along the gallbladder fossa most compatible with hepatic steatosis.  Cholelithiasis without evidence of acute cholecystitis.  I will order a limited abdominal just after evaluation.  At this time, will initiate broad-spectrum antibiotic, IV fluid resuscitation, will consult for admission.  11:13 PM Appreciate consultation from [facility] Bowen, Dr. Myna Hidalgo who agrees to see and admit pt.  Care discussed with Dr. Darl Householder.  Pt given fluid resuscitation at 53m/kg, broad spectrum abx given.    AQUAVION BOULEwas evaluated in Emergency Department on 03/14/2020 for the symptoms described in the history of present illness. He was evaluated in the context of the global COVID-19 pandemic,  which necessitated consideration that the patient might be at risk for infection with the SARS-CoV-2 virus that causes COVID-19. Institutional protocols and algorithms that pertain to the evaluation of patients at risk for COVID-19 are in a state of rapid change based on information released by regulatory bodies including the CDC and federal and state organizations. These policies and algorithms were followed during the patient's care in the ED.    Domenic Moras, PA-C 03/14/20 2318    Drenda Freeze, MD 03/15/20 [phone]

## 2020-03-14 NOTE — ED Notes (Signed)
Pt transported to CT ?

## 2020-03-15 ENCOUNTER — Inpatient Hospital Stay (HOSPITAL_COMMUNITY): Payer: Medicare HMO

## 2020-03-15 DIAGNOSIS — D649 Anemia, unspecified: Secondary | ICD-10-CM | POA: Insufficient documentation

## 2020-03-15 DIAGNOSIS — N390 Urinary tract infection, site not specified: Secondary | ICD-10-CM | POA: Insufficient documentation

## 2020-03-15 LAB — FERRITIN: Ferritin: 1292 ng/mL — ABNORMAL HIGH (ref 24–336)

## 2020-03-15 LAB — RETICULOCYTES
Immature Retic Fract: 18.8 % — ABNORMAL HIGH (ref 2.3–15.9)
RBC.: 2.35 MIL/uL — ABNORMAL LOW (ref 4.22–5.81)
Retic Count, Absolute: 74.3 10*3/uL (ref 19.0–186.0)
Retic Ct Pct: 3.2 % — ABNORMAL HIGH (ref 0.4–3.1)

## 2020-03-15 LAB — CBC
HCT: 25.9 % — ABNORMAL LOW (ref 39.0–52.0)
Hemoglobin: 8.3 g/dL — ABNORMAL LOW (ref 13.0–17.0)
MCH: 34.6 pg — ABNORMAL HIGH (ref 26.0–34.0)
MCHC: 32 g/dL (ref 30.0–36.0)
MCV: 107.9 fL — ABNORMAL HIGH (ref 80.0–100.0)
Platelets: 121 10*3/uL — ABNORMAL LOW (ref 150–400)
RBC: 2.4 MIL/uL — ABNORMAL LOW (ref 4.22–5.81)
RDW: 18.9 % — ABNORMAL HIGH (ref 11.5–15.5)
WBC: 12.3 10*3/uL — ABNORMAL HIGH (ref 4.0–10.5)
nRBC: 0 % (ref 0.0–0.2)

## 2020-03-15 LAB — IRON AND TIBC
Iron: 85 ug/dL (ref 45–182)
Saturation Ratios: 68 % — ABNORMAL HIGH (ref 17.9–39.5)
TIBC: 126 ug/dL — ABNORMAL LOW (ref 250–450)
UIBC: 41 ug/dL

## 2020-03-15 LAB — OSMOLALITY: Osmolality: 281 mOsm/kg (ref 275–295)

## 2020-03-15 LAB — BRAIN NATRIURETIC PEPTIDE: B Natriuretic Peptide: 98.7 pg/mL (ref 0.0–100.0)

## 2020-03-15 LAB — COMPREHENSIVE METABOLIC PANEL
ALT: 116 U/L — ABNORMAL HIGH (ref 0–44)
AST: 449 U/L — ABNORMAL HIGH (ref 15–41)
Albumin: 2.3 g/dL — ABNORMAL LOW (ref 3.5–5.0)
Alkaline Phosphatase: 178 U/L — ABNORMAL HIGH (ref 38–126)
Anion gap: 14 (ref 5–15)
BUN: 10 mg/dL (ref 6–20)
CO2: 22 mmol/L (ref 22–32)
Calcium: 8.6 mg/dL — ABNORMAL LOW (ref 8.9–10.3)
Chloride: 94 mmol/L — ABNORMAL LOW (ref 98–111)
Creatinine, Ser: 0.85 mg/dL (ref 0.61–1.24)
GFR calc Af Amer: >60 mL/min (ref >60)
GFR calc non Af Amer: >60 mL/min (ref >60)
Glucose, Bld: 93 mg/dL (ref 70–99)
Potassium: 3 mmol/L — ABNORMAL LOW (ref 3.5–5.1)
Sodium: 130 mmol/L — ABNORMAL LOW (ref 135–145)
Total Bilirubin: 11.9 mg/dL — ABNORMAL HIGH (ref 0.3–1.2)
Total Protein: 7.5 g/dL (ref 6.5–8.1)

## 2020-03-15 LAB — VITAMIN B12: Vitamin B-12: 1145 pg/mL — ABNORMAL HIGH (ref 180–914)

## 2020-03-15 LAB — HEPATITIS PANEL, ACUTE
HCV Ab: NONREACTIVE
Hep A IgM: NONREACTIVE
Hep B C IgM: NONREACTIVE
Hepatitis B Surface Ag: NONREACTIVE

## 2020-03-15 LAB — MAGNESIUM: Magnesium: 2.3 mg/dL (ref 1.7–2.4)

## 2020-03-15 LAB — PHOSPHORUS: Phosphorus: 2.1 mg/dL — ABNORMAL LOW (ref 2.5–4.6)

## 2020-03-15 LAB — FOLATE: Folate: 15.9 ng/mL (ref >5.9)

## 2020-03-15 MED ORDER — ALBUTEROL SULFATE HFA 108 (90 BASE) MCG/ACT IN AERS: 2.0000 | INHALATION_SPRAY | Freq: Four times a day (QID) | RESPIRATORY_TRACT | Status: DC | PRN

## 2020-03-15 MED ORDER — PANTOPRAZOLE SODIUM 40 MG PO TBEC
40.0000 mg | DELAYED_RELEASE_TABLET | Freq: Every morning | ORAL | Status: DC
  Administered 2020-03-15 – 2020-03-20 (×6): 40 mg via ORAL
  Filled 2020-03-15 (×6): qty 1

## 2020-03-15 MED ORDER — IPRATROPIUM-ALBUTEROL 0.5-2.5 (3) MG/3ML IN SOLN
3.0000 mL | RESPIRATORY_TRACT | Status: DC
  Administered 2020-03-15: 3 mL via RESPIRATORY_TRACT

## 2020-03-15 MED ORDER — LORAZEPAM 2 MG/ML IJ SOLN: 0.0000 mg | Freq: Two times a day (BID) | INTRAMUSCULAR | Status: DC

## 2020-03-15 MED ORDER — NICOTINE 14 MG/24HR TD PT24
14.0000 mg | MEDICATED_PATCH | Freq: Every day | TRANSDERMAL | Status: DC
  Administered 2020-03-16 – 2020-03-20 (×5): 14 mg via TRANSDERMAL
  Filled 2020-03-15 (×5): qty 1

## 2020-03-15 MED ORDER — VANCOMYCIN HCL IN DEXTROSE 1-5 GM/200ML-% IV SOLN: 1000.0000 mg | Freq: Once | INTRAVENOUS | Status: DC

## 2020-03-15 MED ORDER — MAGNESIUM SULFATE IN D5W 1-5 GM/100ML-% IV SOLN
1.0000 g | Freq: Once | INTRAVENOUS | Status: AC
  Administered 2020-03-15: 1 g via INTRAVENOUS
  Filled 2020-03-15: qty 100

## 2020-03-15 MED ORDER — THIAMINE HCL 100 MG PO TABS
100.0000 mg | ORAL_TABLET | Freq: Every day | ORAL | Status: DC
  Administered 2020-03-15 – 2020-03-20 (×6): 100 mg via ORAL
  Filled 2020-03-15 (×7): qty 1

## 2020-03-15 MED ORDER — SODIUM CHLORIDE 0.9% FLUSH
3.0000 mL | Freq: Two times a day (BID) | INTRAVENOUS | Status: DC
  Administered 2020-03-15 – 2020-03-19 (×7): 3 mL via INTRAVENOUS

## 2020-03-15 MED ORDER — VANCOMYCIN HCL IN DEXTROSE 1-5 GM/200ML-% IV SOLN
1000.0000 mg | Freq: Three times a day (TID) | INTRAVENOUS | Status: DC
  Administered 2020-03-15 – 2020-03-17 (×7): 1000 mg via INTRAVENOUS
  Filled 2020-03-15 (×7): qty 200

## 2020-03-15 MED ORDER — TOPIRAMATE 100 MG PO TABS
100.0000 mg | ORAL_TABLET | Freq: Every day | ORAL | Status: DC
  Administered 2020-03-15 – 2020-03-19 (×5): 100 mg via ORAL
  Filled 2020-03-15 (×5): qty 1

## 2020-03-15 MED ORDER — LORAZEPAM 2 MG/ML IJ SOLN
0.0000 mg | Freq: Four times a day (QID) | INTRAMUSCULAR | Status: DC
  Administered 2020-03-16 (×2): 1 mg via INTRAVENOUS
  Filled 2020-03-15: qty 1

## 2020-03-15 MED ORDER — POTASSIUM CHLORIDE CRYS ER 20 MEQ PO TBCR
20.0000 meq | EXTENDED_RELEASE_TABLET | Freq: Once | ORAL | Status: AC
  Administered 2020-03-15: 20 meq via ORAL
  Filled 2020-03-15: qty 1

## 2020-03-15 MED ORDER — IPRATROPIUM-ALBUTEROL 0.5-2.5 (3) MG/3ML IN SOLN
3.0000 mL | RESPIRATORY_TRACT | Status: DC | PRN
  Administered 2020-03-17: 3 mL via RESPIRATORY_TRACT
  Filled 2020-03-15: qty 3

## 2020-03-15 MED ORDER — LORAZEPAM 2 MG/ML IJ SOLN
1.0000 mg | INTRAMUSCULAR | Status: DC | PRN
  Filled 2020-03-15: qty 1

## 2020-03-15 MED ORDER — LORAZEPAM 1 MG PO TABS: 1.0000 mg | ORAL_TABLET | ORAL | Status: DC | PRN

## 2020-03-15 MED ORDER — POTASSIUM CHLORIDE IN NACL 40-0.9 MEQ/L-% IV SOLN
INTRAVENOUS | Status: DC
  Administered 2020-03-15: 75 mL/h via INTRAVENOUS
  Filled 2020-03-15: qty 1000

## 2020-03-15 MED ORDER — FUROSEMIDE 10 MG/ML IJ SOLN
40.0000 mg | Freq: Once | INTRAMUSCULAR | Status: AC
  Administered 2020-03-15: 40 mg via INTRAVENOUS
  Filled 2020-03-15: qty 4

## 2020-03-15 MED ORDER — SODIUM CHLORIDE 0.9 % IV SOLN
2.0000 g | Freq: Three times a day (TID) | INTRAVENOUS | Status: DC
  Administered 2020-03-15 – 2020-03-17 (×7): 2 g via INTRAVENOUS
  Filled 2020-03-15 (×7): qty 2

## 2020-03-15 MED ORDER — FOLIC ACID 1 MG PO TABS
1.0000 mg | ORAL_TABLET | Freq: Every day | ORAL | Status: DC
  Administered 2020-03-15 – 2020-03-20 (×6): 1 mg via ORAL
  Filled 2020-03-15 (×6): qty 1

## 2020-03-15 MED ORDER — VALACYCLOVIR HCL 500 MG PO TABS
1000.0000 mg | ORAL_TABLET | Freq: Every day | ORAL | Status: DC
  Administered 2020-03-15 – 2020-03-20 (×6): 1000 mg via ORAL
  Filled 2020-03-15 (×6): qty 2

## 2020-03-15 NOTE — Progress Notes (Signed)
Pharmacy Antibiotic Note  Rollo Farquhar Keator is a 34 y.o. male admitted on 03/14/2020 with sepsis, cellulitis and UTI.  Pharmacy has been consulted for Vancomycin, cefepime dosing.  Plan: Nomogram vanocomycin 1gm iv q8hr Vancomycin goal trough: 15-20  Cefepime 2gm iv x1, then 2gm iv q8hr   Weight: 126.1 kg (278 lb)  Temp (24hrs), Avg:99.3 F (37.4 C), Min:98.8 F (37.1 C), Max:99.8 F (37.7 C)  Recent Labs  Lab 03/14/20 1947 03/14/20 2147  WBC 12.9*  --   CREATININE 0.94  --   LATICACIDVEN 2.5* 1.6    Estimated Creatinine Clearance: 155.4 mL/min (by C-G formula based on SCr of 0.94 mg/dL).    Allergies  Allergen Reactions  . Dapsone Other (See Comments)    Per centricity "G6PD deficient"  . Primaquine Phosphate Other (See Comments)    Per Centricity "G6PD deficient"    Antimicrobials this admission: Vancomycin 03/14/2020 >> Cefepime 03/14/2020 >>   Dose adjustments this admission: -  Microbiology results: -  Thank you for allowing pharmacy to be a part of this patient's care.  Nani Skillern Crowford 03/15/2020 6:09 AM

## 2020-03-15 NOTE — Progress Notes (Addendum)
[facility]                                                                              Patient Demographics  [name], is a 34 y.o. male, DOB - Dec 05, 1985, EXN:[id number]  Admit date - 03/14/2020   Admitting Physician [name], MD  Outpatient Primary MD for the patient is [name], MD  Outpatient specialists:   LOS - 1  days   Medical records reviewed and are as summarized below:    Chief Complaint  Patient presents with  . Weakness  . Jaundice       Brief summary   Patient is a 34 year old male with history of HIV, seizures, schizoaffective disorder, was admitted with Covid pneumonia in 10/2019 presented to ED with lethargy, dysuria and confusion.  BP 90/60 with EMS that improved in ED. In ED, chest x-ray negative, CT head negative for acute intracranial abnormality.  CT abdomen pelvis showed cellulitis, possibly developing abscess in the pubic area, bloating, hepatomegaly, cholelithiasis without evidence of acute cholecystitis.  Cystitis with ascending UTI, no biliary dilatation or focal lesions. Right upper quadrant ultrasound layering sludge in the gallbladder without acute cholecystitis or biliary dilatation LFTs elevated, total bilirubin 11.2 UA positive for UTI.  Assessment & Plan    Principal Problem:   Sepsis secondary to UTI [facility]), cellulitis/folliculitis in groin area -Patient met sepsis criteria at the time of admission with leukocytosis, lactic acidosis, hypotension, UA positive for UTI, CT with developing abscess involving pubic area and groin -UA positive for UTI, follow blood cultures, urine culture -UA positive for ketones, continue IV fluid hydration -Continue vancomycin, cefepime and Flagyl   Transaminitis -Alk phos 187, AST 442, ALT 116, total bilirubin 11.2 at the time of admission. -Total bilirubin 11.9, this morning.  Ammonia level normal -History of alcohol dependence,  discriminant function score 31 -Hepatitis  panel negative, [facility] consulted, (sees Dr. Alessandra Bevels outpatient, ?  Diagnosis of autoimmune hepatitis, PBC) -CT abdomen pelvis, right upper quadrant ultrasound negative for acute cholecystitis.  Layering sludge in the gallbladder without stones, no biliary dilatation on ultrasound.  Acute encephalopathy -Possibly worsened due to dehydration, sepsis, UTI.  Ammonia level normal.  CT head negative -Hold sedating medication, correct electrolytes, continue IV fluids  History of HIV CD4 790, viral load undetectable in 10/2019 Biktarvy currently held due to hepatomegaly and acutely elevated LFTs.  May need to discuss with ID/GI prior to resuming   history of tobacco use, likely has underlying COPD -Diffusely wheezing, congested at the time of my examination, smokes 1 pack/day -Patient received 3.5 L fluid boluses in the ED.  Chest x-ray showed cardiomegaly and pulmonary vascular congestion -DC IV fluids, Lasix 40 mg IV x1 -2D echo 1/21 had shown EF of 60 to 65%, may have underlying diastolic CHF -Placed on duo nebs, nicotine patch  Anemia, thrombocytopenia -Hemoglobin 8.3, FOBT negative -Transfuse for hemoglobin less than 7, follow anemia panel  Hyponatremia Presented with sodium of 127, has received 3.5 L saline in ED, now congested, will DC IV fluids Sodium improving 130  Hypokalemia -Replaced  Alcohol dependence States he drinks 2 pack of beer  daily, currently not in any withdrawals -Continue CIWA scale with Ativan, thiamine, folate   Schizoaffective disorder -Hold potentially sedating medications  Prolonged QT interval  QTC 515 on admission, continue telemetry, minimize QT prolonging meds Mg2.3, replace potassium Repeat EKG  Obesity Estimated body mass index is 34.75 kg/m as calculated from the following:   Height as of 02/17/20: 6' 3"  (1.905 m).   Weight as of this encounter: 126.1 kg.  Code Status: Full code DVT Prophylaxis: SCD's Family Communication: Discussed  all imaging results, lab results, explained to the patient   Disposition Plan:     Status is: Inpatient  Remains inpatient appropriate because:Inpatient level of care appropriate due to severity of illness   Dispo: The patient is from: Home              Anticipated d/c is to: Home              Anticipated d/c date is: 2 days              Patient currently is not medically stable to d/c.      Time Spent in minutes   35 minutes  Procedures:  None  Consultants:   Gastroenterology  Antimicrobials:   Anti-infectives (From admission, onward)   Start     Dose/Rate Route Frequency Ordered Stop   03/15/20 1000  valACYclovir (VALTREX) tablet 1,000 mg     1,000 mg Oral Daily 03/15/20 0606     03/15/20 0800  vancomycin (VANCOCIN) IVPB 1000 mg/200 mL premix     1,000 mg 200 mL/hr over 60 Minutes Intravenous Every 8 hours 03/15/20 0608     03/15/20 0615  vancomycin (VANCOCIN) IVPB 1000 mg/200 mL premix  Status:  Discontinued     1,000 mg 200 mL/hr over 60 Minutes Intravenous  Once 03/15/20 0606 03/15/20 0608   03/15/20 0615  ceFEPIme (MAXIPIME) 2 g in sodium chloride 0.9 % 100 mL IVPB     2 g 200 mL/hr over 30 Minutes Intravenous Every 8 hours 03/15/20 0602     03/14/20 2245  vancomycin (VANCOREADY) IVPB 2000 mg/400 mL     2,000 mg 200 mL/hr over 120 Minutes Intravenous  Once 03/14/20 2236 03/15/20 0138   03/14/20 2230  ceFEPIme (MAXIPIME) 2 g in sodium chloride 0.9 % 100 mL IVPB     2 g 200 mL/hr over 30 Minutes Intravenous  Once 03/14/20 2227 03/14/20 2326   03/14/20 2230  metroNIDAZOLE (FLAGYL) IVPB 500 mg     500 mg 100 mL/hr over 60 Minutes Intravenous  Once 03/14/20 2227 03/15/20 0036   03/14/20 2230  vancomycin (VANCOCIN) IVPB 1000 mg/200 mL premix  Status:  Discontinued     1,000 mg 200 mL/hr over 60 Minutes Intravenous  Once 03/14/20 2227 03/14/20 2236          Medications  Scheduled Meds: . folic acid  1 mg Oral Daily  . LORazepam  0-4 mg Intravenous Q6H    Followed by  . [START ON 03/17/2020] LORazepam  0-4 mg Intravenous Q12H  . pantoprazole  40 mg Oral q morning - 10a  . sodium chloride flush  3 mL Intravenous Q12H  . thiamine  100 mg Oral Daily  . topiramate  100 mg Oral QHS  . valACYclovir  1,000 mg Oral Daily   Continuous Infusions: . 0.9 % NaCl with KCl 40 mEq / L 75 mL/hr (03/15/20 0628)  . ceFEPime (MAXIPIME) IV Stopped (03/15/20 0724)  . vancomycin 1,000 mg (03/15/20 1009)  PRN Meds:.albuterol, ipratropium-albuterol, LORazepam **OR** LORazepam      Subjective:   [name] was seen and examined today.  Much more alert and awake however diffusely wheezing and congested at the time of my examination.  Denies any abdominal pain, nausea or vomiting.  No chest pain.  Afebrile  Objective:   Vitals:   03/15/20 0730 03/15/20 0900 03/15/20 0925 03/15/20 1011  BP: 139/73 139/73  (!) 141/73  Pulse: 91 91  88  Resp: 16 16  16   Temp:      TempSrc:      SpO2: 94% 94% 96% 96%  Weight:        Intake/Output Summary (Last 24 hours) at 03/15/2020 1138 Last data filed at 03/15/2020 1696 Gross per 24 hour  Intake 4079.34 ml  Output --  Net 4079.34 ml     Wt Readings from Last 3 Encounters:  03/15/20 126.1 kg  02/17/20 126.1 kg  10/27/19 (!) 137.4 kg     Exam  General: Alert and oriented x 2, NAD  Cardiovascular: S1 S2 auscultated, no murmurs, RRR  Respiratory: Coarse breath sounds with wheezing bilaterally  Gastrointestinal: Soft, nontender, nondistended, + bowel sounds  Ext: no pedal edema bilaterally  Neuro: Moving all 4 extremities  Musculoskeletal: No digital cyanosis, clubbing  Skin: Folliculitis involving pubic region and groin, some areas of induration, spontaneous drainage of scant purulent material  Psych: Much more alert and awake   Data Reviewed:  I have personally reviewed following labs and imaging studies  Micro Results Recent Results (from the past 240 hour(s))  SARS Coronavirus 2 by RT PCR  (hospital order, performed in [place] hospital lab) Nasopharyngeal Nasopharyngeal Swab     Status: None   Collection Time: 03/14/20  9:02 PM   Specimen: Nasopharyngeal Swab  Result Value Ref Range Status   SARS Coronavirus 2 NEGATIVE NEGATIVE Final    Comment: (NOTE) SARS-CoV-2 target nucleic acids are NOT DETECTED. The SARS-CoV-2 RNA is generally detectable in upper and lower respiratory specimens during the acute phase of infection. The lowest concentration of SARS-CoV-2 viral copies this assay can detect is 250 copies / mL. A negative result does not preclude SARS-CoV-2 infection and should not be used as the sole basis for treatment or other patient management decisions.  A negative result may occur with improper specimen collection / handling, submission of specimen other than nasopharyngeal swab, presence of viral mutation(s) within the areas targeted by this assay, and inadequate number of viral copies (<250 copies / mL). A negative result must be combined with clinical observations, patient history, and epidemiological information. Fact Sheet for Patients:   StrictlyIdeas.no Fact Sheet for Healthcare Providers: BankingDealers.co.za This test is not yet approved or cleared  by the [place] FDA and has been authorized for detection and/or diagnosis of SARS-CoV-2 by FDA under an Emergency Use Authorization (EUA).  This EUA will remain in effect (meaning this test can be used) for the duration of the COVID-19 declaration under Section 564(b)(1) of the Act, 21 U.S.C. section 360bbb-3(b)(1), unless the authorization is terminated or revoked sooner. Performed at [facility], [place] [address] [place], [place] [address]     Radiology Reports CT Head Wo Contrast  Result Date: 03/14/2020 CLINICAL DATA:  Dizziness and lethargy. EXAM: CT HEAD WITHOUT CONTRAST TECHNIQUE: Contiguous axial images were obtained from  the base of the skull through the vertex without intravenous contrast. COMPARISON:  October 27, 2019 FINDINGS: Brain: No evidence of acute infarction, hemorrhage, hydrocephalus, extra-axial  collection or mass lesion/mass effect. There is mild bilateral basal ganglia calcification. Vascular: No hyperdense vessel or unexpected calcification. Skull: Normal. Negative for fracture or focal lesion. Sinuses/Orbits: No acute finding. Other: None. IMPRESSION: No acute intracranial pathology. Electronically Signed   By: Virgina Norfolk M.D.   On: 03/14/2020 22:14   CT ABDOMEN PELVIS W CONTRAST  Result Date: 03/14/2020 CLINICAL DATA:  Jaundice, burning urination, history of HIV and COVID-19 EXAM: CT ABDOMEN AND PELVIS WITH CONTRAST TECHNIQUE: Multidetector CT imaging of the abdomen and pelvis was performed using the standard protocol following bolus administration of intravenous contrast. CONTRAST:  166m OMNIPAQUE IOHEXOL 300 MG/ML  SOLN COMPARISON:  MRI 08/27/2018, ultrasound 08/01/2018 FINDINGS: Lower chest: Few patchy multifocal ground-glass opacities in the lung bases. No effusion. Normal heart size. No pericardial effusion. Hepatobiliary: Mild hepatomegaly with diffuse hepatic hypoattenuation sparing along the gallbladder fossa most compatible with hepatic steatosis seen on comparison MR. Smooth liver surface contour. No focal liver lesion. Layering gallstones within the otherwise normal gallbladder. No visible intraductal gallstones or biliary dilatation. Pancreas: There is some questionable stranding versus motion artifact near the body and tail of the pancreas. No focal concerning lesion. No duct dilatation. Spleen: Normal in size without focal abnormality. Adrenals/Urinary Tract: Normal adrenal glands. Kidneys enhance symmetrically. No concerning renal lesion. No urolithiasis or hydronephrosis. Some mild urothelial thickening of the ureters is noted. There is circumferential bladder wall thickening with mucosal  hyperemia and small focus of intraluminal gas. Stomach/Bowel: Distal esophagus, stomach and duodenal sweep are unremarkable. No small bowel wall thickening or dilatation. No evidence of obstruction. A normal appendix is visualized. No colonic dilatation or wall thickening. Vascular/Lymphatic: Atherosclerotic calcifications throughout the abdominal aorta and branch vessels. No aneurysm or ectasia. Several borderline enlarged and possibly reactive nodes are present in the inguinal chains. No other enlarged lymph nodes. Reproductive: The prostate and seminal vesicles are unremarkable. Included portions of the external genitalia are unremarkable. Other: Multiple areas of skin thickening and subcutaneous stranding as well as possible developing rim enhancing collections in the subcutaneous soft tissues of the anterior mons pubis including a collection measuring 1.8 x 2.0 x 1.7 cm. Stranding and inflammation from this location extends to the base of the penile shaft. Additional areas of focal skin thickening noted along the right gluteal cleft and anterior thigh soft tissues. Small fat containing umbilical hernia. No abdominopelvic free air or fluid. 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 No other acute or suspicious osseous lesions. IMPRESSION: 1. Multiple areas of skin thickening and subcutaneous stranding as well as possible developing rim enhancing collections in the subcutaneous soft tissues, most pronounced of the anterior mons pubis including a subcutaneous collection measuring 1.8 x 2.0 x 1.7 cm. Stranding and inflammation from this location extends to the base of the penile shaft. Additional areas of focal skin thickening noted along the right gluteal cleft and anterior thigh soft tissues. Findings are concerning for cellulitis with possible developing abscesses. Recommend correlation  with direct visual inspection. 2. Circumferential bladder wall thickening with mucosal hyperemia and small focus of intraluminal gas. Findings are concerning for cystitis with ascending urinary tract infection. 3. 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 4. Few patchy multifocal ground-glass opacities in the lung bases compatible with COVID-19 pneumonia. 5. Mild hepatomegaly with diffuse hepatic hypoattenuation sparing along the gallbladder  fossa most compatible with hepatic steatosis seen on comparison MR. 6. Cholelithiasis without evidence of acute cholecystitis. If there is persisting clinical concern, correlate with right upper quadrant ultrasound. 7. Aortic Atherosclerosis (ICD10-I70.0). These results were called by telephone at the time of interpretation on 03/14/2020 at 10:20 pm to provider [facility] , who verbally acknowledged these results. Electronically Signed   By: Lovena Le M.D.   On: 03/14/2020 22:20   US Abdomen Limited  Result Date: 03/14/2020 CLINICAL DATA:  Transaminitis.  Gallstone. EXAM: ULTRASOUND ABDOMEN LIMITED RIGHT UPPER QUADRANT COMPARISON:  Abdominal CT earlier today. Abdominal ultrasound 08/01/2018. FINDINGS: Gallbladder: Physiologically distended. Layering sludge but no evidence of shadowing stone. No wall thickening or pericholecystic fluid. No sonographic Murphy sign noted by sonographer. Common bile duct: Diameter: 6 mm. Liver: No focal lesion identified. Diffusely increased and heterogeneous in parenchymal echogenicity. Portal vein is patent on color Doppler imaging with normal direction of blood flow towards the liver. Other: No right upper quadrant ascites. IMPRESSION: 1. Layering sludge in the gallbladder without stones or sonographic findings of acute cholecystitis. No biliary dilatation. 2. Diffusely increased and heterogeneous parenchymal echogenicity consistent  with steatosis. Electronically Signed   By: Keith Rake M.D.   On: 03/14/2020 23:25   DG CHEST PORT 1 VIEW  Result Date: 03/15/2020 CLINICAL DATA:  Lethargy.  Altered mental status.  Wheezing. EXAM: PORTABLE CHEST 1 VIEW COMPARISON:  03/14/2020 FINDINGS: Stable elevation of the RIGHT hemidiaphragm. Heart size is mildly enlarged. There is increased perihilar infiltrate and pulmonary vascular congestion, increased. IMPRESSION: Cardiomegaly and pulmonary vascular congestion. Electronically Signed   By: Nolon Nations M.D.   On: 03/15/2020 09:24   DG Chest Port 1 View  Result Date: 03/14/2020 CLINICAL DATA:  Dizziness and lethargy. EXAM: PORTABLE CHEST 1 VIEW COMPARISON:  October 27, 2019 FINDINGS: There is no evidence of acute infiltrate, pleural effusion or pneumothorax. There is mild, stable elevation of the right hemidiaphragm. The heart size and mediastinal contours are within normal limits. The visualized skeletal structures are unremarkable. IMPRESSION: No active disease. Electronically Signed   By: Virgina Norfolk M.D.   On: 03/14/2020 21:33    Lab Data:  CBC: Recent Labs  Lab 03/14/20 1947 03/15/20 1002  WBC 12.9* 12.3*  NEUTROABS 10.3*  --   HGB 8.4* 8.3*  HCT 26.3* 25.9*  MCV 106.9* 107.9*  PLT 111* 229*   Basic Metabolic Panel: Recent Labs  Lab 03/14/20 1947 03/15/20 1002  NA 127* 130*  K 3.1* 3.0*  CL 89* 94*  CO2 22 22  GLUCOSE 92 93  BUN 10 10  CREATININE 0.94 0.85  CALCIUM 8.8* 8.6*  MG  --  2.3  PHOS  --  2.1*   GFR: Estimated Creatinine Clearance: 171.8 mL/min (by C-G formula based on SCr of 0.85 mg/dL). Liver Function Tests: Recent Labs  Lab 03/14/20 1947 03/15/20 1002  AST 442* 449*  ALT 116* 116*  ALKPHOS 187* 178*  BILITOT 11.2* 11.9*  PROT 7.8 7.5  ALBUMIN 2.3* 2.3*   Recent Labs  Lab 03/14/20 1947  LIPASE 60*   Recent Labs  Lab 03/14/20 1947  AMMONIA 29   Coagulation Profile: Recent Labs  Lab 03/14/20 2233  INR 1.4*    Cardiac Enzymes: No results for input(s): CKTOTAL, CKMB, CKMBINDEX, TROPONINI in the last 168 hours. BNP (last 3 results) No results for input(s): PROBNP in the last 8760 hours. HbA1C: No results for input(s): HGBA1C in the last 72 hours. CBG: No results for input(s): GLUCAP  in the last 168 hours. Lipid Profile: No results for input(s): CHOL, HDL, LDLCALC, TRIG, CHOLHDL, LDLDIRECT in the last 72 hours. Thyroid Function Tests: No results for input(s): TSH, T4TOTAL, FREET4, T3FREE, THYROIDAB in the last 72 hours. Anemia Panel: Recent Labs    03/15/20 1002  FOLATE 15.9  RETICCTPCT 3.2*   Urine analysis:    Component Value Date/Time   COLORURINE AMBER (A) 03/14/2020 1947   APPEARANCEUR CLOUDY (A) 03/14/2020 1947   LABSPEC 1.011 03/14/2020 1947   PHURINE 6.0 03/14/2020 1947   GLUCOSEU NEGATIVE 03/14/2020 1947   HGBUR LARGE (A) 03/14/2020 1947   BILIRUBINUR MODERATE (A) 03/14/2020 1947   KETONESUR 20 (A) 03/14/2020 1947   PROTEINUR 100 (A) 03/14/2020 1947   UROBILINOGEN 0.2 12/21/2014 1006   NITRITE NEGATIVE 03/14/2020 1947   LEUKOCYTESUR MODERATE (A) 03/14/2020 1947     Lakira Ogando M.D. [facility] 03/15/2020, 11:38 AM   Call night coverage person covering after 7pm

## 2020-03-15 NOTE — Consult Note (Signed)
[facility] Consultation Note  Referring Provider: [facility] Primary Care Physician:  [name], MD Primary Gastroenterologist:  Dr. Otis Brace  Reason for Consultation:  Elevated liver enzymes  HPI: [name] is a 34 y.o. male with multiple medical problems, including alcohol abuse who presents with jaundice, weakness, lethargy, confusion. Imaging/lab work-up has shown possible cellulitis/abscess in groin as well as elevated liver enzymes.  No reports of GI bleeding.  Drinks alcohol heavily.   Past Medical History:  Diagnosis Date  . Bipolar 1 disorder ([place])   . Depression   . Dizziness and giddiness 02/01/2016  . Herpes genitalia   . HIV disease ([place])   . Hypertension   . Migraine headache 02/01/2016  . Peripheral neuropathy 10/01/2019  . PTSD (post-traumatic stress disorder)   . Schizoaffective disorder ([place])   . Seizures ([place])     Past Surgical History:  Procedure Laterality Date  . BACK SURGERY    . HAND SURGERY      Prior to Admission medications   Medication Sig Start Date End Date Taking? Authorizing Provider  albuterol (PROVENTIL HFA;VENTOLIN HFA) 108 (90 BASE) MCG/ACT inhaler Inhale 2 puffs into the lungs every 6 (six) hours as needed for wheezing or shortness of breath.   Yes [provider]  alprazolam Duanne Moron) 2 MG tablet Take 2 mg by mouth 3 (three) times daily.   Yes [provider]  benzonatate (TESSALON) 100 MG capsule Take 200 mg by mouth every 8 (eight) hours as needed for cough. 10/23/19  Yes [provider]  bictegravir-emtricitabine-tenofovir AF (BIKTARVY) 50-200-25 MG TABS tablet Take 1 tablet by mouth daily.  02/24/17  Yes [provider]  famotidine (PEPCID) 40 MG tablet Take 40 mg by mouth daily.   Yes [provider]  FLUoxetine (PROZAC) 40 MG capsule Take 40 mg by mouth daily.   Yes [provider]  folic acid (FOLVITE) 1 MG tablet Take 1 mg by mouth daily.  10/03/19  Yes [provider]  furosemide (LASIX) 40 MG tablet Take 40 mg by mouth daily.  02/27/20  Yes [provider]  haloperidol (HALDOL) 5 MG tablet Take 5 mg by mouth daily.  06/19/19  Yes [provider]  hydrOXYzine (ATARAX/VISTARIL) 50 MG tablet Take 1 tablet (50 mg total) by mouth every 6 (six) hours as needed for anxiety. 07/25/18  Yes Pennelope Bracken, MD  linaclotide Rolan Lipa) 145 MCG CAPS capsule Take 145 mcg by mouth daily as needed (constipation).   Yes [provider]  lurasidone 120 MG TABS Take 1 tablet (120 mg total) by mouth daily. 05/15/15  Yes Niel Hummer, NP  meclizine (ANTIVERT) 25 MG tablet Take 25 mg by mouth every 8 (eight) hours as needed for dizziness.  03/02/20  Yes [provider]  ondansetron (ZOFRAN) 8 MG tablet Take 8 mg by mouth every 8 (eight) hours as needed for nausea/vomiting. 10/03/19  Yes [provider]  pantoprazole (PROTONIX) 40 MG tablet Take 40 mg by mouth every morning. 03/02/20  Yes [provider]  promethazine (PHENERGAN) 12.5 MG tablet Take 12.5 mg by mouth 2 (two) times daily as needed for nausea or vomiting.  03/02/20  Yes [provider]  thiamine (VITAMIN B-1) 100 MG tablet Take 100 mg by mouth daily.  01/31/19  Yes [provider]  topiramate (TOPAMAX) 100 MG tablet Take 1 tablet (100 mg total) by mouth at bedtime. 02/17/20  Yes Suzzanne Cloud, NP  traZODone (DESYREL) 300 MG tablet  Take 1 tablet (300 mg total) by mouth at bedtime as needed for sleep. Patient taking differently: Take 300 mg by mouth at bedtime.  05/15/15  Yes Niel Hummer, NP  valACYclovir (VALTREX) 1000 MG tablet Take 1 tablet (1,000 mg total) by mouth daily. 05/15/15  Yes Niel Hummer, NP  Vitamin D, Ergocalciferol, (DRISDOL) 1.25 MG (50000 UNIT) CAPS capsule Take 50,000 Units by mouth once a week. 10/03/19  Yes [provider]  zolpidem (AMBIEN) 10 MG tablet Take 10 mg by mouth at  bedtime. 09/06/19  Yes [provider]  cetirizine (ZYRTEC) 10 MG tablet Take 1 tablet (10 mg total) by mouth daily. Patient not taking: Reported on 03/14/2020 05/15/15   Niel Hummer, NP  sulfamethoxazole-trimethoprim (BACTRIM DS) 800-160 MG tablet Take 1 tablet by mouth every 12 (twelve) hours. Patient not taking: Reported on 03/14/2020 11/02/19   Patrecia Pour, MD    Current Facility-Administered Medications  Medication Dose Route Frequency Provider Last Rate Last Admin  . albuterol (VENTOLIN HFA) 108 (90 Base) MCG/ACT inhaler 2 puff  2 puff Inhalation Q6H PRN Opyd, Ilene Qua, MD      . ceFEPIme (MAXIPIME) 2 g in sodium chloride 0.9 % 100 mL IVPB  2 g Intravenous Q8H Rai, Ripudeep K, MD   Stopped at 03/15/20 0724  . folic acid (FOLVITE) tablet 1 mg  1 mg Oral Daily Opyd, Ilene Qua, MD   1 mg at 03/15/20 1008  . furosemide (LASIX) injection 40 mg  40 mg Intravenous Once Rai, Ripudeep K, MD      . ipratropium-albuterol (DUONEB) 0.5-2.5 (3) MG/3ML nebulizer solution 3 mL  3 mL Nebulization Q4H PRN Rai, Ripudeep K, MD      . LORazepam (ATIVAN) injection 0-4 mg  0-4 mg Intravenous Q6H Opyd, Ilene Qua, MD       Followed by  . [START ON 03/17/2020] LORazepam (ATIVAN) injection 0-4 mg  0-4 mg Intravenous Q12H Opyd, Timothy S, MD      . LORazepam (ATIVAN) tablet 1-4 mg  1-4 mg Oral Q1H PRN Opyd, Ilene Qua, MD       Or  . LORazepam (ATIVAN) injection 1-4 mg  1-4 mg Intravenous Q1H PRN Opyd, Ilene Qua, MD      . nicotine (NICODERM CQ - dosed in mg/24 hours) patch 14 mg  14 mg Transdermal Daily Rai, Ripudeep K, MD      . pantoprazole (PROTONIX) EC tablet 40 mg  40 mg Oral q morning - 10a Opyd, Ilene Qua, MD   40 mg at 03/15/20 1008  . sodium chloride flush (NS) 0.9 % injection 3 mL  3 mL Intravenous Q12H Opyd, Ilene Qua, MD   3 mL at 03/15/20 1009  . thiamine tablet 100 mg  100 mg Oral Daily Opyd, Ilene Qua, MD   100 mg at 03/15/20 1008  . topiramate (TOPAMAX) tablet 100 mg  100 mg Oral QHS Opyd,  Ilene Qua, MD      . valACYclovir (VALTREX) tablet 1,000 mg  1,000 mg Oral Daily Opyd, Ilene Qua, MD   1,000 mg at 03/15/20 1008  . vancomycin (VANCOCIN) IVPB 1000 mg/200 mL premix  1,000 mg Intravenous Q8H Rai, Ripudeep K, MD   Stopped at 03/15/20 1109   Current Outpatient Medications  Medication Sig Dispense Refill  . albuterol (PROVENTIL HFA;VENTOLIN HFA) 108 (90 BASE) MCG/ACT inhaler Inhale 2 puffs into the lungs every 6 (six) hours as needed for wheezing or shortness of breath.    [name]  alprazolam (XANAX) 2 MG tablet Take 2 mg by mouth 3 (three) times daily.    . benzonatate (TESSALON) 100 MG capsule Take 200 mg by mouth every 8 (eight) hours as needed for cough.    . bictegravir-emtricitabine-tenofovir AF (BIKTARVY) 50-200-25 MG TABS tablet Take 1 tablet by mouth daily.     . famotidine (PEPCID) 40 MG tablet Take 40 mg by mouth daily.    [name] FLUoxetine (PROZAC) 40 MG capsule Take 40 mg by mouth daily.    . folic acid (FOLVITE) 1 MG tablet Take 1 mg by mouth daily.    . furosemide (LASIX) 40 MG tablet Take 40 mg by mouth daily.     . haloperidol (HALDOL) 5 MG tablet Take 5 mg by mouth daily.     . hydrOXYzine (ATARAX/VISTARIL) 50 MG tablet Take 1 tablet (50 mg total) by mouth every 6 (six) hours as needed for anxiety. 30 tablet 0  . linaclotide (LINZESS) 145 MCG CAPS capsule Take 145 mcg by mouth daily as needed (constipation).    [name] lurasidone 120 MG TABS Take 1 tablet (120 mg total) by mouth daily. 30 tablet 0  . meclizine (ANTIVERT) 25 MG tablet Take 25 mg by mouth every 8 (eight) hours as needed for dizziness.     . ondansetron (ZOFRAN) 8 MG tablet Take 8 mg by mouth every 8 (eight) hours as needed for nausea/vomiting.    . pantoprazole (PROTONIX) 40 MG tablet Take 40 mg by mouth every morning.    . promethazine (PHENERGAN) 12.5 MG tablet Take 12.5 mg by mouth 2 (two) times daily as needed for nausea or vomiting.     . thiamine (VITAMIN B-1) 100 MG tablet Take 100 mg by mouth daily.     [name]  topiramate (TOPAMAX) 100 MG tablet Take 1 tablet (100 mg total) by mouth at bedtime. 90 tablet 1  . traZODone (DESYREL) 300 MG tablet Take 1 tablet (300 mg total) by mouth at bedtime as needed for sleep. (Patient taking differently: Take 300 mg by mouth at bedtime. ) 30 tablet 0  . valACYclovir (VALTREX) 1000 MG tablet Take 1 tablet (1,000 mg total) by mouth daily.    . Vitamin D, Ergocalciferol, (DRISDOL) 1.25 MG (50000 UNIT) CAPS capsule Take 50,000 Units by mouth once a week.    . zolpidem (AMBIEN) 10 MG tablet Take 10 mg by mouth at bedtime.    . cetirizine (ZYRTEC) 10 MG tablet Take 1 tablet (10 mg total) by mouth daily. (Patient not taking: Reported on 03/14/2020)    . sulfamethoxazole-trimethoprim (BACTRIM DS) 800-160 MG tablet Take 1 tablet by mouth every 12 (twelve) hours. (Patient not taking: Reported on 03/14/2020) 6 tablet 0    Allergies as of 03/14/2020 - Review Complete 03/14/2020  Allergen Reaction Noted  . Dapsone Other (See Comments) 09/08/2011  . Primaquine phosphate Other (See Comments) 09/08/2011    Family History  Problem Relation Age of Onset  . Alcohol abuse Mother   . Schizophrenia Father   . Depression Father   . Alcohol abuse Father   . Alcohol abuse Paternal Uncle   . Alcohol abuse Paternal Uncle     Social History   Socioeconomic History  . Marital status: Single    Spouse name: Not on file  . Number of children: Not on file  . Years of education: Not on file  . Highest education level: Not on file  Occupational History  . Occupation: [employer]  Tobacco Use  . Smoking status: Current  Every Day Smoker    Packs/day: 1.00    Types: Cigarettes  . Smokeless tobacco: Never Used  Substance and Sexual Activity  . Alcohol use: Yes    Alcohol/week: 14.0 standard drinks    Types: 14 Cans of beer per week    Comment: 5-6 40oz per day  . Drug use: No  . Sexual activity: Not Currently    Birth control/protection: Condom  Other Topics Concern  . Not on file   Social History Narrative   Lives at home alone   Right-handed   Drinks 2-3 sodas per day   Social Determinants of Health   Financial Resource Strain:   . Difficulty of Paying Living Expenses:   Food Insecurity:   . Worried About Charity fundraiser in the Last Year:   . Arboriculturist in the Last Year:   Transportation Needs:   . Film/video editor (Medical):   [name] Lack of Transportation (Non-Medical):   Physical Activity:   . Days of Exercise per Week:   . Minutes of Exercise per Session:   Stress:   . Feeling of Stress :   Social Connections:   . Frequency of Communication with Friends and Family:   . Frequency of Social Gatherings with Friends and Family:   . Attends Religious Services:   . Active Member of Clubs or Organizations:   . Attends Archivist Meetings:   [name] Marital Status:   Intimate Partner Violence:   . Fear of Current or Ex-Partner:   . Emotionally Abused:   [name] Physically Abused:   . Sexually Abused:     Review of Systems: As per HPI, all others negative  Physical Exam: Vital signs in last 24 hours: Temp:  [98.8 F (37.1 C)-99.8 F (37.7 C)] 99.8 F (37.7 C) (06/05 2207) Pulse Rate:  [67-111] 91 (06/06 1330) Resp:  [14-37] 18 (06/06 1330) BP: (113-178)/(48-118) 178/96 (06/06 1330) SpO2:  [91 %-100 %] 96 % (06/06 1330) Weight:  [126.1 kg] 126.1 kg (06/06 0500)   General:   Alert,  Overweight, intermittent confusion Head:  Normocephalic and atraumatic. Eyes:  Scleral icterus bilaterally.   Conjunctiva pink. Ears:  Normal auditory acuity. Nose:  No deformity, discharge,  or lesions. Mouth:  No deformity or lesions.  Oropharynx pink & moist. Neck:  Supple; no masses or thyromegaly. Abdomen:  Soft, nontender and nondistended. No masses, hepatosplenomegaly or hernias noted. Normal bowel sounds, without guarding, and without rebound.     Msk:  Symmetrical without gross deformities. Normal posture. Pulses:  Normal pulses  noted. Extremities:  3+ non pitting edema bilaterally lower extremities Neurologic:  Intermittently confused Skin:  Intact without significant lesions or rashes. Psych:  Alert and cooperative. Normal mood and affect.   Lab Results: Recent Labs    03/14/20 1947 03/15/20 1002  WBC 12.9* 12.3*  HGB 8.4* 8.3*  HCT 26.3* 25.9*  PLT 111* 121*   BMET Recent Labs    03/14/20 1947 03/15/20 1002  NA 127* 130*  K 3.1* 3.0*  CL 89* 94*  CO2 22 22  GLUCOSE 92 93  BUN 10 10  CREATININE 0.94 0.85  CALCIUM 8.8* 8.6*   LFT Recent Labs    03/14/20 1947 03/14/20 1947 03/15/20 1002  PROT 7.8   < > 7.5  ALBUMIN 2.3*   < > 2.3*  AST 442*   < > 449*  ALT 116*   < > 116*  ALKPHOS 187*   < > 178*  BILITOT 11.2*   < > 11.9*  BILIDIR 6.8*  --   --    < > = values in this interval not displayed.   PT/INR Recent Labs    03/14/20 2233  LABPROT 16.9*  INR 1.4*    Studies/Results: CT Head Wo Contrast  Result Date: 03/14/2020 CLINICAL DATA:  Dizziness and lethargy. EXAM: CT HEAD WITHOUT CONTRAST TECHNIQUE: Contiguous axial images were obtained from the base of the skull through the vertex without intravenous contrast. COMPARISON:  October 27, 2019 FINDINGS: Brain: No evidence of acute infarction, hemorrhage, hydrocephalus, extra-axial collection or mass lesion/mass effect. There is mild bilateral basal ganglia calcification. Vascular: No hyperdense vessel or unexpected calcification. Skull: Normal. Negative for fracture or focal lesion. Sinuses/Orbits: No acute finding. Other: None. IMPRESSION: No acute intracranial pathology. Electronically Signed   By: Virgina Norfolk M.D.   On: 03/14/2020 22:14   CT ABDOMEN PELVIS W CONTRAST  Result Date: 03/14/2020 CLINICAL DATA:  Jaundice, burning urination, history of HIV and COVID-19 EXAM: CT ABDOMEN AND PELVIS WITH CONTRAST TECHNIQUE: Multidetector CT imaging of the abdomen and pelvis was performed using the standard protocol following bolus  administration of intravenous contrast. CONTRAST:  129m OMNIPAQUE IOHEXOL 300 MG/ML  SOLN COMPARISON:  MRI 08/27/2018, ultrasound 08/01/2018 FINDINGS: Lower chest: Few patchy multifocal ground-glass opacities in the lung bases. No effusion. Normal heart size. No pericardial effusion. Hepatobiliary: Mild hepatomegaly with diffuse hepatic hypoattenuation sparing along the gallbladder fossa most compatible with hepatic steatosis seen on comparison MR. Smooth liver surface contour. No focal liver lesion. Layering gallstones within the otherwise normal gallbladder. No visible intraductal gallstones or biliary dilatation. Pancreas: There is some questionable stranding versus motion artifact near the body and tail of the pancreas. No focal concerning lesion. No duct dilatation. Spleen: Normal in size without focal abnormality. Adrenals/Urinary Tract: Normal adrenal glands. Kidneys enhance symmetrically. No concerning renal lesion. No urolithiasis or hydronephrosis. Some mild urothelial thickening of the ureters is noted. There is circumferential bladder wall thickening with mucosal hyperemia and small focus of intraluminal gas. Stomach/Bowel: Distal esophagus, stomach and duodenal sweep are unremarkable. No small bowel wall thickening or dilatation. No evidence of obstruction. A normal appendix is visualized. No colonic dilatation or wall thickening. Vascular/Lymphatic: Atherosclerotic calcifications throughout the abdominal aorta and branch vessels. No aneurysm or ectasia. Several borderline enlarged and possibly reactive nodes are present in the inguinal chains. No other enlarged lymph nodes. Reproductive: The prostate and seminal vesicles are unremarkable. Included portions of the external genitalia are unremarkable. Other: Multiple areas of skin thickening and subcutaneous stranding as well as possible developing rim enhancing collections in the subcutaneous soft tissues of the anterior mons pubis including a  collection measuring 1.8 x 2.0 x 1.7 cm. Stranding and inflammation from this location extends to the base of the penile shaft. Additional areas of focal skin thickening noted along the right gluteal cleft and anterior thigh soft tissues. Small fat containing umbilical hernia. No abdominopelvic free air or fluid. 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 No other acute or suspicious osseous lesions. IMPRESSION: 1. Multiple areas of skin thickening and subcutaneous stranding as well as possible developing rim enhancing collections in the subcutaneous soft tissues, most pronounced of the anterior mons pubis including a subcutaneous collection measuring 1.8 x 2.0 x 1.7 cm. Stranding and inflammation from this location extends to the base of the penile shaft. Additional areas of focal skin  thickening noted along the right gluteal cleft and anterior thigh soft tissues. Findings are concerning for cellulitis with possible developing abscesses. Recommend correlation with direct visual inspection. 2. Circumferential bladder wall thickening with mucosal hyperemia and small focus of intraluminal gas. Findings are concerning for cystitis with ascending urinary tract infection. 3. 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 4. Few patchy multifocal ground-glass opacities in the lung bases compatible with COVID-19 pneumonia. 5. Mild hepatomegaly with diffuse hepatic hypoattenuation sparing along the gallbladder fossa most compatible with hepatic steatosis seen on comparison MR. 6. Cholelithiasis without evidence of acute cholecystitis. If there is persisting clinical concern, correlate with right upper quadrant ultrasound. 7. Aortic Atherosclerosis (ICD10-I70.0). These results were called by telephone  at the time of interpretation on 03/14/2020 at 10:20 pm to provider [facility] , who verbally acknowledged these results. Electronically Signed   By: Lovena Le M.D.   On: 03/14/2020 22:20   US Abdomen Limited  Result Date: 03/14/2020 CLINICAL DATA:  Transaminitis.  Gallstone. EXAM: ULTRASOUND ABDOMEN LIMITED RIGHT UPPER QUADRANT COMPARISON:  Abdominal CT earlier today. Abdominal ultrasound 08/01/2018. FINDINGS: Gallbladder: Physiologically distended. Layering sludge but no evidence of shadowing stone. No wall thickening or pericholecystic fluid. No sonographic Murphy sign noted by sonographer. Common bile duct: Diameter: 6 mm. Liver: No focal lesion identified. Diffusely increased and heterogeneous in parenchymal echogenicity. Portal vein is patent on color Doppler imaging with normal direction of blood flow towards the liver. Other: No right upper quadrant ascites. IMPRESSION: 1. Layering sludge in the gallbladder without stones or sonographic findings of acute cholecystitis. No biliary dilatation. 2. Diffusely increased and heterogeneous parenchymal echogenicity consistent with steatosis. Electronically Signed   By: Keith Rake M.D.   On: 03/14/2020 23:25   DG CHEST PORT 1 VIEW  Result Date: 03/15/2020 CLINICAL DATA:  Lethargy.  Altered mental status.  Wheezing. EXAM: PORTABLE CHEST 1 VIEW COMPARISON:  03/14/2020 FINDINGS: Stable elevation of the RIGHT hemidiaphragm. Heart size is mildly enlarged. There is increased perihilar infiltrate and pulmonary vascular congestion, increased. IMPRESSION: Cardiomegaly and pulmonary vascular congestion. Electronically Signed   By: Nolon Nations M.D.   On: 03/15/2020 09:24   DG Chest Port 1 View  Result Date: 03/14/2020 CLINICAL DATA:  Dizziness and lethargy. EXAM: PORTABLE CHEST 1 VIEW COMPARISON:  October 27, 2019 FINDINGS: There is no evidence of acute infiltrate, pleural effusion or pneumothorax. There is mild, stable elevation of the right hemidiaphragm.  The heart size and mediastinal contours are within normal limits. The visualized skeletal structures are unremarkable. IMPRESSION: No active disease. Electronically Signed   By: Virgina Norfolk M.D.   On: 03/14/2020 21:33   Impression:  1.  Elevated LFTs.  Differential diagnosis sepsis/infection mediated versus alcohol versus other.  No biliary obstruction on imaging. 2.  Jaundice. 3.  Dysuria. 4.  Confusion.  Infection-mediated most likely.  Doubt hepatic encephalopathy. 5.  Crural cellulitis/abscess/folliculitis. 6.  HIV, on therapy, recent undetectable VL with CD4 >700. 7.  Anemia, thrombocytopenia:  Suspect bone marrow suppression from alcohol. 8.  Schizoaffective disorder.  Plan:  1.  Given concomitant infection, prednisolone (for possible severe alcohol hepatitis) is contraindicated. 2.  Follow liver eyzymes, PT/INR and clinical course; if no improvement of mental status despite treatment of infection, could consider lactulose. 3.  Patient, should liver failure develop (which he currently does NOT have, and most likely will not develop during this admission), patient is not liver  transplant candidate for many reasons (ongoing alcohol, superimposed infection). 4.  [facility] will follow.    LOS: 1 day   Ki Corbo M  03/15/2020, 3:04 PM  Cell [phone] If no answer or after 5 PM call [phone]

## 2020-03-16 LAB — COMPREHENSIVE METABOLIC PANEL
ALT: 103 U/L — ABNORMAL HIGH (ref 0–44)
AST: 315 U/L — ABNORMAL HIGH (ref 15–41)
Albumin: 2 g/dL — ABNORMAL LOW (ref 3.5–5.0)
Alkaline Phosphatase: 158 U/L — ABNORMAL HIGH (ref 38–126)
Anion gap: 11 (ref 5–15)
BUN: 10 mg/dL (ref 6–20)
CO2: 24 mmol/L (ref 22–32)
Calcium: 8.2 mg/dL — ABNORMAL LOW (ref 8.9–10.3)
Chloride: 93 mmol/L — ABNORMAL LOW (ref 98–111)
Creatinine, Ser: 0.9 mg/dL (ref 0.61–1.24)
GFR calc Af Amer: >60 mL/min (ref >60)
GFR calc non Af Amer: >60 mL/min (ref >60)
Glucose, Bld: 124 mg/dL — ABNORMAL HIGH (ref 70–99)
Potassium: 2.6 mmol/L — CL (ref 3.5–5.1)
Sodium: 128 mmol/L — ABNORMAL LOW (ref 135–145)
Total Bilirubin: 10.1 mg/dL — ABNORMAL HIGH (ref 0.3–1.2)
Total Protein: 6.9 g/dL (ref 6.5–8.1)

## 2020-03-16 LAB — CBC WITH DIFFERENTIAL/PLATELET
Abs Immature Granulocytes: 0.18 10*3/uL — ABNORMAL HIGH (ref 0.00–0.07)
Basophils Absolute: 0.1 10*3/uL (ref 0.0–0.1)
Basophils Relative: 1 %
Eosinophils Absolute: 0.1 10*3/uL (ref 0.0–0.5)
Eosinophils Relative: 1 %
HCT: 22.5 % — ABNORMAL LOW (ref 39.0–52.0)
Hemoglobin: 7.2 g/dL — ABNORMAL LOW (ref 13.0–17.0)
Immature Granulocytes: 2 %
Lymphocytes Relative: 11 %
Lymphs Abs: 1.1 10*3/uL (ref 0.7–4.0)
MCH: 33.6 pg (ref 26.0–34.0)
MCHC: 32 g/dL (ref 30.0–36.0)
MCV: 105.1 fL — ABNORMAL HIGH (ref 80.0–100.0)
Monocytes Absolute: 1.2 10*3/uL — ABNORMAL HIGH (ref 0.1–1.0)
Monocytes Relative: 11 %
Neutro Abs: 7.5 10*3/uL (ref 1.7–7.7)
Neutrophils Relative %: 74 %
Platelets: 124 10*3/uL — ABNORMAL LOW (ref 150–400)
RBC: 2.14 MIL/uL — ABNORMAL LOW (ref 4.22–5.81)
RDW: 18.8 % — ABNORMAL HIGH (ref 11.5–15.5)
WBC: 10.1 10*3/uL (ref 4.0–10.5)
nRBC: 0 % (ref 0.0–0.2)

## 2020-03-16 LAB — ABO/RH: ABO/RH(D): O POS

## 2020-03-16 LAB — PROTIME-INR
INR: 1.3 — ABNORMAL HIGH (ref 0.8–1.2)
Prothrombin Time: 16 seconds — ABNORMAL HIGH (ref 11.4–15.2)

## 2020-03-16 LAB — POTASSIUM: Potassium: 2.9 mmol/L — ABNORMAL LOW (ref 3.5–5.1)

## 2020-03-16 MED ORDER — METRONIDAZOLE IN NACL 5-0.79 MG/ML-% IV SOLN
500.0000 mg | Freq: Three times a day (TID) | INTRAVENOUS | Status: DC
  Administered 2020-03-16 – 2020-03-17 (×3): 500 mg via INTRAVENOUS
  Filled 2020-03-16 (×3): qty 100

## 2020-03-16 MED ORDER — LACTULOSE 10 GM/15ML PO SOLN
10.0000 g | Freq: Every day | ORAL | Status: DC
  Administered 2020-03-16 – 2020-03-18 (×3): 10 g via ORAL
  Filled 2020-03-16 (×3): qty 30

## 2020-03-16 MED ORDER — POTASSIUM CHLORIDE 10 MEQ/100ML IV SOLN
10.0000 meq | INTRAVENOUS | Status: AC
  Administered 2020-03-16 (×5): 10 meq via INTRAVENOUS
  Filled 2020-03-16 (×5): qty 100

## 2020-03-16 NOTE — Progress Notes (Signed)
CRITICAL VALUE ALERT  Critical Value:  Potassium 2.6  Date & Time Notied:  03/16/20, 0600  Provider Notified: Lara Mulch   Orders Received/Actions taken: waiting on orders

## 2020-03-16 NOTE — Progress Notes (Signed)
[name] 10:27 AM  Subjective: Patient seen and examined and case discussed with my partner Dr. Paulita Fujita and hospital computer chart reviewed and care everywhere reviewed and he is hungry and wants to eat and is feeling better and has no specific complaints and he does live alone and has questions about his HIV medicine  Objective: Vital signs stable afebrile no acute distress abdomen is soft nontender liver test slightly decreased BUN and creatinine okay anemia of chronic disease based on iron studies and ferritin white count decreased  Assessment: Multiple medical problems including elevated liver tests questionable etiology  Plan: I have put an order in to get the liver biopsy pathology results from his [facility] liver biopsy in January and will need to ask infectious disease to assist with his medicines otherwise we will wait on above and see if cessation of alcohol helps  [facility]  office [phone] After 5PM or if no answer call [phone]

## 2020-03-16 NOTE — Progress Notes (Signed)
While performing morning hygiene care for patient, RN noticed patient's blister/abscess on groin and perineal area was actively bleeding.This RN and NT cleaned and applied mepilex foam on all affected area. Patient was also producing bloody urine this morning that was amber color earlier in shift. Patient did not voice any discomfort or distress in the mean time. On-call provider was made aware. On-call will consult urologist. Will continue to monitor.

## 2020-03-16 NOTE — Progress Notes (Addendum)
PROGRESS NOTE    [name]  DUK:[id number]  DOB: 1985/10/21  PCP: [name], MD Admit date:03/14/2020 34 year old male with history of HIV, seizures, schizoaffective disorder, alcohol dependence who was admitted with Covid pneumonia in 10/2019 presented to ED now with lethargy, dysuria and confusion.  BP 90/60 with EMS that improved in ED. ED Course: Afebrile,saturating low 90s on room air, slightly tachycardic, and with stable blood pressure. sodium 127, potassium 3.1, alkaline phosphatase 187, albumin 2.3, AST "42, ALT 60, and total bilirubin 11.2.  CBC features a new leukocytosis to 12,900, worsened chronic anemia with hemoglobin of 8.4, and thrombocytopenia with platelets 111,000.  Fecal occult blood testing is negative, acetaminophen level undetectable, COVID-19 screening test also negative.  Lactic acid was elevated to 2.5, then down to 1.6 with fluids.  INR is 1.4.  CXR unremarkable. UA positive for UTI.CT head negative for acute intracranial abnormality.  CT abdomen pelvis showed cellulitis, possibly developing abscess in the pubic area, bloating, hepatomegaly, cholelithiasis without evidence of acute cholecystitis. Cystitis with ascending UTI, no biliary dilatation or focal lesions.RUQ ultrasound reported layering sludge in the gallbladder without acute cholecystitis or biliary dilatation Hospital course: Patient received Vanc/cefepime/flagyl and admitted to [facility] for further evaluation and management of sepsis, cystitis, hepatic failure and metabolic encephalopathy.  Subjective:  Patient  appears to be drowsy but oriented x3.  He received Ativan x1 last night.  No sedatives this morning.  Denies any pain/diarrhea. Patient reports having a nl BM this morning after 3 days. T-max 99.8 today  Objective: Vitals:   03/16/20 0000 03/16/20 0504 03/16/20 0552 03/16/20 0643  BP:  (!) 96/56 (!) 118/53   Pulse: 96 94 96   Resp:  18    Temp:  99.8 F (37.7 C)    TempSrc:  Oral    SpO2:   93%    Weight:    123.9 kg  Height:        Intake/Output Summary (Last 24 hours) at 03/16/2020 0937 Last data filed at 03/16/2020 0600 Gross per 24 hour  Intake 1001.08 ml  Output 1900 ml  Net -898.92 ml   Filed Weights   03/15/20 0500 03/15/20 1700 03/16/20 0643  Weight: 126.1 kg 126 kg 123.9 kg    Physical Examination:  General exam: Appears calm and comfortable  Respiratory system: Clear to auscultation. Respiratory effort normal. Cardiovascular system: S1 & S2 heard, RRR. No JVD, murmurs, rubs, gallops or clicks. No pedal edema. Gastrointestinal system: Abdomen is nondistended, soft and nontender. Normal bowel sounds heard. Central nervous system:  Drowsy and oriented. No new focal neurological deficits. Extremities: No contractures, edema or joint deformities.  Skin:  Folliculitis involving pubic region and groin, some areas of induration, spontaneous drainage of scant purulent material Psychiatry: Judgement and insight appear okay. Mood & affect appropriate.   Data Reviewed: I have personally reviewed following labs and imaging studies  CBC: Recent Labs  Lab 03/14/20 1947 03/15/20 1002 03/16/20 0446  WBC 12.9* 12.3* 10.1  NEUTROABS 10.3*  --  7.5  HGB 8.4* 8.3* 7.2*  HCT 26.3* 25.9* 22.5*  MCV 106.9* 107.9* 105.1*  PLT 111* 121* 376*   Basic Metabolic Panel: Recent Labs  Lab 03/14/20 1947 03/15/20 1002 03/16/20 0446  NA 127* 130* 128*  K 3.1* 3.0* 2.6*  CL 89* 94* 93*  CO2 22 22 24   GLUCOSE 92 93 124*  BUN 10 10 10   CREATININE 0.94 0.85 0.90  CALCIUM 8.8* 8.6* 8.2*  MG  --  2.3  --  PHOS  --  2.1*  --    GFR: Estimated Creatinine Clearance: 161 mL/min (by C-G formula based on SCr of 0.9 mg/dL). Liver Function Tests: Recent Labs  Lab 03/14/20 1947 03/15/20 1002 03/16/20 0446  AST 442* 449* 315*  ALT 116* 116* 103*  ALKPHOS 187* 178* 158*  BILITOT 11.2* 11.9* 10.1*  PROT 7.8 7.5 6.9  ALBUMIN 2.3* 2.3* 2.0*   Recent Labs  Lab 03/14/20 1947   LIPASE 60*   Recent Labs  Lab 03/14/20 1947  AMMONIA 29   Coagulation Profile: Recent Labs  Lab 03/14/20 2233 03/16/20 0446  INR 1.4* 1.3*   Cardiac Enzymes: No results for input(s): CKTOTAL, CKMB, CKMBINDEX, TROPONINI in the last 168 hours. BNP (last 3 results) No results for input(s): PROBNP in the last 8760 hours. HbA1C: No results for input(s): HGBA1C in the last 72 hours. CBG: No results for input(s): GLUCAP in the last 168 hours. Lipid Profile: No results for input(s): CHOL, HDL, LDLCALC, TRIG, CHOLHDL, LDLDIRECT in the last 72 hours. Thyroid Function Tests: No results for input(s): TSH, T4TOTAL, FREET4, T3FREE, THYROIDAB in the last 72 hours. Anemia Panel: Recent Labs    03/15/20 1002  VITAMINB12 1,145*  FOLATE 15.9  FERRITIN 1,292*  TIBC 126*  IRON 85  RETICCTPCT 3.2*   Sepsis Labs: Recent Labs  Lab 03/14/20 1947 03/14/20 2147  LATICACIDVEN 2.5* 1.6    Recent Results (from the past 240 hour(s))  SARS Coronavirus 2 by RT PCR (hospital order, performed in [facility] hospital lab) Nasopharyngeal Nasopharyngeal Swab     Status: None   Collection Time: 03/14/20  9:02 PM   Specimen: Nasopharyngeal Swab  Result Value Ref Range Status   SARS Coronavirus 2 NEGATIVE NEGATIVE Final    Comment: (NOTE) SARS-CoV-2 target nucleic acids are NOT DETECTED. The SARS-CoV-2 RNA is generally detectable in upper and lower respiratory specimens during the acute phase of infection. The lowest concentration of SARS-CoV-2 viral copies this assay can detect is 250 copies / mL. A negative result does not preclude SARS-CoV-2 infection and should not be used as the sole basis for treatment or other patient management decisions.  A negative result may occur with improper specimen collection / handling, submission of specimen other than nasopharyngeal swab, presence of viral mutation(s) within the areas targeted by this assay, and inadequate number of viral copies (<250 copies  / mL). A negative result must be combined with clinical observations, patient history, and epidemiological information. Fact Sheet for Patients:   StrictlyIdeas.no Fact Sheet for Healthcare Providers: BankingDealers.co.za This test is not yet approved or cleared  by the [place] FDA and has been authorized for detection and/or diagnosis of SARS-CoV-2 by FDA under an Emergency Use Authorization (EUA).  This EUA will remain in effect (meaning this test can be used) for the duration of the COVID-19 declaration under Section 564(b)(1) of the Act, 21 U.S.C. section 360bbb-3(b)(1), unless the authorization is terminated or revoked sooner. Performed at [facility], [place] [address], [place], [place] [address]   Urine culture     Status: None (Preliminary result)   Collection Time: 03/14/20 10:31 PM   Specimen: In/Out Cath Urine  Result Value Ref Range Status   Specimen Description   Final    IN/OUT CATH URINE Performed at [facility] [address], [place], [place] [address]    Special Requests   Final    NONE Performed at [facility], [place] [address] [place], [place]  [address]    Culture   Final    CULTURE REINCUBATED FOR BETTER GROWTH Performed at [facility], [place] [address] [place], [place] [address]    Report Status PENDING  Incomplete  Blood Culture (routine x 2)     Status: None (Preliminary result)   Collection Time: 03/14/20 10:50 PM   Specimen: BLOOD  Result Value Ref Range Status   Specimen Description   Final    BLOOD LEFT ANTECUBITAL Performed at [facility] [address] [place], [place] [address]    Special Requests   Final    BOTTLES DRAWN AEROBIC AND ANAEROBIC Blood Culture adequate volume Performed at [facility] [address] [place], [place] [address]    Culture   Final    NO GROWTH 1  DAY Performed at [facility], [place] [address], [place], [place] [address]    Report Status PENDING  Incomplete  Blood Culture (routine x 2)     Status: None (Preliminary result)   Collection Time: 03/15/20 10:02 AM   Specimen: BLOOD  Result Value Ref Range Status   Specimen Description   Final    BLOOD RIGHT ARM Performed at [facility] [address] [place], [place] [address]    Special Requests   Final    BOTTLES DRAWN AEROBIC ONLY Blood Culture adequate volume Performed at [facility] [address], [place], [place] [address]    Culture   Final    NO GROWTH < 24 HOURS Performed at [facility] [address], [place], [place] [address]    Report Status PENDING  Incomplete      Radiology Studies: CT Head Wo Contrast  Result Date: 03/14/2020 CLINICAL DATA:  Dizziness and lethargy. EXAM: CT HEAD WITHOUT CONTRAST TECHNIQUE: Contiguous axial images were obtained from the base of the skull through the vertex without intravenous contrast. COMPARISON:  October 27, 2019 FINDINGS: Brain: No evidence of acute infarction, hemorrhage, hydrocephalus, extra-axial collection or mass lesion/mass effect. There is mild bilateral basal ganglia calcification. Vascular: No hyperdense vessel or unexpected calcification. Skull: Normal. Negative for fracture or focal lesion. Sinuses/Orbits: No acute finding. Other: None. IMPRESSION: No acute intracranial pathology. Electronically Signed   By: Virgina Norfolk M.D.   On: 03/14/2020 22:14   CT ABDOMEN PELVIS W CONTRAST  Result Date: 03/14/2020 CLINICAL DATA:  Jaundice, burning urination, history of HIV and COVID-19 EXAM: CT ABDOMEN AND PELVIS WITH CONTRAST TECHNIQUE: Multidetector CT imaging of the abdomen and pelvis was performed using the standard protocol following bolus administration of intravenous contrast. CONTRAST:  11m OMNIPAQUE IOHEXOL 300 MG/ML  SOLN COMPARISON:  MRI 08/27/2018, ultrasound  08/01/2018 FINDINGS: Lower chest: Few patchy multifocal ground-glass opacities in the lung bases. No effusion. Normal heart size. No pericardial effusion. Hepatobiliary: Mild hepatomegaly with diffuse hepatic hypoattenuation sparing along the gallbladder fossa most compatible with hepatic steatosis seen on comparison MR. Smooth liver surface contour. No focal liver lesion. Layering gallstones within the otherwise normal gallbladder. No visible intraductal gallstones or biliary dilatation. Pancreas: There is some questionable stranding versus motion artifact near the body and tail of the pancreas. No focal concerning lesion. No duct dilatation. Spleen: Normal in size without focal abnormality. Adrenals/Urinary Tract: Normal adrenal glands. Kidneys enhance symmetrically. No concerning renal lesion. No urolithiasis or hydronephrosis. Some mild urothelial thickening of the ureters is noted. There is circumferential bladder wall thickening with mucosal hyperemia and small focus of intraluminal gas. Stomach/Bowel:  Distal esophagus, stomach and duodenal sweep are unremarkable. No small bowel wall thickening or dilatation. No evidence of obstruction. A normal appendix is visualized. No colonic dilatation or wall thickening. Vascular/Lymphatic: Atherosclerotic calcifications throughout the abdominal aorta and branch vessels. No aneurysm or ectasia. Several borderline enlarged and possibly reactive nodes are present in the inguinal chains. No other enlarged lymph nodes. Reproductive: The prostate and seminal vesicles are unremarkable. Included portions of the external genitalia are unremarkable. Other: Multiple areas of skin thickening and subcutaneous stranding as well as possible developing rim enhancing collections in the subcutaneous soft tissues of the anterior mons pubis including a collection measuring 1.8 x 2.0 x 1.7 cm. Stranding and inflammation from this location extends to the base of the penile shaft. Additional  areas of focal skin thickening noted along the right gluteal cleft and anterior thigh soft tissues. Small fat containing umbilical hernia. No abdominopelvic free air or fluid. 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 No other acute or suspicious osseous lesions. IMPRESSION: 1. Multiple areas of skin thickening and subcutaneous stranding as well as possible developing rim enhancing collections in the subcutaneous soft tissues, most pronounced of the anterior mons pubis including a subcutaneous collection measuring 1.8 x 2.0 x 1.7 cm. Stranding and inflammation from this location extends to the base of the penile shaft. Additional areas of focal skin thickening noted along the right gluteal cleft and anterior thigh soft tissues. Findings are concerning for cellulitis with possible developing abscesses. Recommend correlation with direct visual inspection. 2. Circumferential bladder wall thickening with mucosal hyperemia and small focus of intraluminal gas. Findings are concerning for cystitis with ascending urinary tract infection. 3. 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 4. Few patchy multifocal ground-glass opacities in the lung bases compatible with COVID-19 pneumonia. 5. Mild hepatomegaly with diffuse hepatic hypoattenuation sparing along the gallbladder fossa most compatible with hepatic steatosis seen on comparison MR. 6. Cholelithiasis without evidence of acute cholecystitis. If there is persisting clinical concern, correlate with right upper quadrant ultrasound. 7. Aortic Atherosclerosis (ICD10-I70.0). These results were called by telephone at the time of interpretation on 03/14/2020 at 10:20 pm to provider [facility] , who verbally acknowledged these results. Electronically  Signed   By: Lovena Le M.D.   On: 03/14/2020 22:20   US Abdomen Limited  Result Date: 03/14/2020 CLINICAL DATA:  Transaminitis.  Gallstone. EXAM: ULTRASOUND ABDOMEN LIMITED RIGHT UPPER QUADRANT COMPARISON:  Abdominal CT earlier today. Abdominal ultrasound 08/01/2018. FINDINGS: Gallbladder: Physiologically distended. Layering sludge but no evidence of shadowing stone. No wall thickening or pericholecystic fluid. No sonographic Murphy sign noted by sonographer. Common bile duct: Diameter: 6 mm. Liver: No focal lesion identified. Diffusely increased and heterogeneous in parenchymal echogenicity. Portal vein is patent on color Doppler imaging with normal direction of blood flow towards the liver. Other: No right upper quadrant ascites. IMPRESSION: 1. Layering sludge in the gallbladder without stones or sonographic findings of acute cholecystitis. No biliary dilatation. 2. Diffusely increased and heterogeneous parenchymal echogenicity consistent with steatosis. Electronically Signed   By: Keith Rake M.D.   On: 03/14/2020 23:25   DG CHEST PORT 1 VIEW  Result Date: 03/15/2020 CLINICAL DATA:  Lethargy.  Altered mental status.  Wheezing. EXAM: PORTABLE CHEST 1 VIEW COMPARISON:  03/14/2020 FINDINGS: Stable elevation of the RIGHT hemidiaphragm. Heart size is mildly  enlarged. There is increased perihilar infiltrate and pulmonary vascular congestion, increased. IMPRESSION: Cardiomegaly and pulmonary vascular congestion. Electronically Signed   By: Nolon Nations M.D.   On: 03/15/2020 09:24   DG Chest Port 1 View  Result Date: 03/14/2020 CLINICAL DATA:  Dizziness and lethargy. EXAM: PORTABLE CHEST 1 VIEW COMPARISON:  October 27, 2019 FINDINGS: There is no evidence of acute infiltrate, pleural effusion or pneumothorax. There is mild, stable elevation of the right hemidiaphragm. The heart size and mediastinal contours are within normal limits. The visualized skeletal structures are unremarkable. IMPRESSION: No  active disease. Electronically Signed   By: Virgina Norfolk M.D.   On: 03/14/2020 21:33        Scheduled Meds: . folic acid  1 mg Oral Daily  . LORazepam  0-4 mg Intravenous Q6H   Followed by  . [START ON 03/17/2020] LORazepam  0-4 mg Intravenous Q12H  . nicotine  14 mg Transdermal Daily  . pantoprazole  40 mg Oral q morning - 10a  . sodium chloride flush  3 mL Intravenous Q12H  . thiamine  100 mg Oral Daily  . topiramate  100 mg Oral QHS  . valACYclovir  1,000 mg Oral Daily   Continuous Infusions: . ceFEPime (MAXIPIME) IV 2 g (03/16/20 0458)  . potassium chloride 10 mEq (03/16/20 0824)  . vancomycin 1,000 mg (03/16/20 0815)     Assessment/Plan:  1.Sepsis secondary to UTI [facility]), pubic cellulitis/abscess-Patient met sepsis criteria at the time of admission with leukocytosis, lactic acidosis, hypotension, UA positive for UTI, CT with developing abscess involving pubic area and groin. Off IV fluids now in concern for pulm congestion. Remains on IV abx- vancomycin, cefepime and renewed Flagyl. Patient developed hematuria overnight, will consult urology/ ID  2. Hyperbilirubinemia/transaminitis-Related to alcoholic/autoimmune hepatitis vs cholestasis (med or infection induced). Alk phos 187, AST 442, ALT 116, total bilirubin 11.2 at the time of admission.Ammonia level normal. History of alcohol dependence,  discriminant function score 31. Hepatitis panel negative, Hepatomegaly with steatosis on CT/USG.  [facility] consulted, (sees Dr. Alessandra Bevels outpatient, ?  Diagnosis of autoimmune hepatitis, PBC per bx at [facility])-CT/ ultrasound negative for acute cholecystitis.  Layering sludge in the gallbladder without stones, no biliary dilatation on ultrasound. Seen by GI and not a candidate for liver transplant. Attempting to obtain bx results from [facility].  3. Hyponatremia-Presented with sodium of 127, has received 3.5 L saline in ED, now congested -off IV fluids/ received lasix. Sodium improving to  130  4. Acute encephalopathy-in the setting of #1 and #2.  Ammonia level normal.  CT head negative-Hold sedating medication, correct electrolytes,  Now off IV fluids. Will give lactulose to help with constipation and monitor   5. History of HIV-CD4 790, viral load undetectable in 10/2019. Biktarvy currently held due to hepatomegaly and acutely elevated LFTs.  Per ID as medication is unlikely to cause elevation in liver enzymes without hyponatremia.  Okay to resume once acute infection improves. GI trying to obtain prior biopsy report  6. Dyspnea/ Pulm edema: Diffusely wheezing, congested on exam, smokes 1 pack/day. Patient received 3.5 L fluid boluses in the ED.  Chest x-ray showed cardiomegaly and pulmonary vascular congestion DCed IV fluids, Lasix 40 mg IV x1 on 6/6-2D echo 1/21 had shown EF of 60 to 65%, may have underlying diastolic CHF. Placed on duo nebs, nicotine patch given history of tobacco use, may have underlying COPD  7. Anemia, thrombocytopenia-Hemoglobin 8.3 yesterday--down to 7.2 today likely related to hematuria overnight, FOBT negative-Transfuse for  hemoglobin less than 7, follow anemia panel  8. Hypokalemia-worsened after lasix use. Replace IV/PO due to #11.Mag >2.0  9. Polysubstance abuse: He has been smoking about 1.00 pack per day. He has never used smokeless tobacco. He reports current alcohol use of about 14.0 standard drinks of alcohol per week. He reports that he does not use drugs. States he drinks 2 pack of beer daily, currently not in any withdrawals -Continue CIWA scale with Ativan, thiamine, folate  10. Hematuria : could be related to hemorrhagic cystitis.  Consulted urology, continue monitoring.  Continue empiric broad-spectrum antibiotics until urine culture results.  11. Prolonged QT interval -QTC 515 on admission, continue telemetry, minimize QT prolonging meds Mg2.3, replace potassium aggressively. Repeat EKG  12. Schizoaffective disorder-Hold  potentially sedating medications  13. Obesity-Estimated body mass index is 34.75 kg/m.    DVT prophylaxis: hold chemoprophylaxis due to anemia/hematuria Code Status: Full Family / Patient Communication: d/w patient Disposition Plan:   Status is: Inpatient  Remains inpatient appropriate because:IV treatments appropriate due to intensity of illness or inability to take PO   Dispo: The patient is from: Home              Anticipated d/c is to: Home              Anticipated d/c date is: > 3 days              Patient currently is not medically stable to d/c.        LOS: 2 days    Time spent:     Guilford Shi, MD [facility] Pager in [place]  If 7PM-7AM, please contact night-coverage [email or web address] 03/16/2020, 9:37 AM

## 2020-03-16 NOTE — Consult Note (Signed)
Urology Consult   Physician requesting consult: Guilford Shi  Reason for consult: Hematuria  History of Present Illness: [name] is a 34 y.o. male with PMH significant for HIV, Bipolar disorder, HTN, PTSD, and seizures who was admitted 03/14/20 due to sepsis from UTI and pubic cellulitis/abscess.  CT A/P reveals no nephrolithiasis, tumors, or hydronephrosis.  It did show circumferential bladder wall thickening with mucosal hyperemia and a subcutaneous collection 1.8 x 2 cm in the anterior mons pubis. UA nitrite negative with many bacteria and >50 RBCs. Urine culture is pending.  WBC on admission was 12.9 and today is 10.1.  Cr 0.9 on admission and remains unchanged. He was started on Vanc, cefepime, and Flagyl. Pt has a condom cath in place and hematuria was noted in foley bag.   He states he first noted urine discoloration and dysuria 2 weeks ago and that both were intermittent. He denies hx of F/C, HA, CP, SOB, N/V, diarrhea/constipation, flank pain, and back pain. He denies a history of voiding or storage urinary symptoms, hematuria, UTIs, urolithiasis, and GU malignancy/trauma/surgery.  Past Medical History:  Diagnosis Date  . Bipolar 1 disorder ([place])   . Depression   . Dizziness and giddiness 02/01/2016  . Herpes genitalia   . HIV disease ([place])   . Hypertension   . Migraine headache 02/01/2016  . Peripheral neuropathy 10/01/2019  . PTSD (post-traumatic stress disorder)   . Schizoaffective disorder ([place])   . Seizures ([place])     Past Surgical History:  Procedure Laterality Date  . BACK SURGERY    . HAND SURGERY      Current Hospital Medications:  Home Meds:  . Current Meds  Medication Sig  . albuterol (PROVENTIL HFA;VENTOLIN HFA) 108 (90 BASE) MCG/ACT inhaler Inhale 2 puffs into the lungs every 6 (six) hours as needed for wheezing or shortness of breath.  . alprazolam (XANAX) 2 MG tablet Take 2 mg by mouth 3 (three) times daily.  . benzonatate (TESSALON) 100 MG  capsule Take 200 mg by mouth every 8 (eight) hours as needed for cough.  . bictegravir-emtricitabine-tenofovir AF (BIKTARVY) 50-200-25 MG TABS tablet Take 1 tablet by mouth daily.   . famotidine (PEPCID) 40 MG tablet Take 40 mg by mouth daily.  [name] FLUoxetine (PROZAC) 40 MG capsule Take 40 mg by mouth daily.  . folic acid (FOLVITE) 1 MG tablet Take 1 mg by mouth daily.  . furosemide (LASIX) 40 MG tablet Take 40 mg by mouth daily.   . haloperidol (HALDOL) 5 MG tablet Take 5 mg by mouth daily.   . hydrOXYzine (ATARAX/VISTARIL) 50 MG tablet Take 1 tablet (50 mg total) by mouth every 6 (six) hours as needed for anxiety.  [name] linaclotide (LINZESS) 145 MCG CAPS capsule Take 145 mcg by mouth daily as needed (constipation).  [name] lurasidone 120 MG TABS Take 1 tablet (120 mg total) by mouth daily.  . meclizine (ANTIVERT) 25 MG tablet Take 25 mg by mouth every 8 (eight) hours as needed for dizziness.   . ondansetron (ZOFRAN) 8 MG tablet Take 8 mg by mouth every 8 (eight) hours as needed for nausea/vomiting.  . pantoprazole (PROTONIX) 40 MG tablet Take 40 mg by mouth every morning.  . promethazine (PHENERGAN) 12.5 MG tablet Take 12.5 mg by mouth 2 (two) times daily as needed for nausea or vomiting.   . thiamine (VITAMIN B-1) 100 MG tablet Take 100 mg by mouth daily.   [name] topiramate (TOPAMAX) 100 MG tablet Take 1 tablet (100  mg total) by mouth at bedtime.  . traZODone (DESYREL) 300 MG tablet Take 1 tablet (300 mg total) by mouth at bedtime as needed for sleep. (Patient taking differently: Take 300 mg by mouth at bedtime. )  . valACYclovir (VALTREX) 1000 MG tablet Take 1 tablet (1,000 mg total) by mouth daily.  . Vitamin D, Ergocalciferol, (DRISDOL) 1.25 MG (50000 UNIT) CAPS capsule Take 50,000 Units by mouth once a week.  . zolpidem (AMBIEN) 10 MG tablet Take 10 mg by mouth at bedtime.    Scheduled Meds: . folic acid  1 mg Oral Daily  . lactulose  10 g Oral Daily  . LORazepam  0-4 mg Intravenous Q6H   Followed  by  . [START ON 03/17/2020] LORazepam  0-4 mg Intravenous Q12H  . nicotine  14 mg Transdermal Daily  . pantoprazole  40 mg Oral q morning - 10a  . sodium chloride flush  3 mL Intravenous Q12H  . thiamine  100 mg Oral Daily  . topiramate  100 mg Oral QHS  . valACYclovir  1,000 mg Oral Daily   Continuous Infusions: . ceFEPime (MAXIPIME) IV 2 g (03/16/20 1436)  . metronidazole    . vancomycin 1,000 mg (03/16/20 0815)   PRN Meds:.ipratropium-albuterol, LORazepam **OR** LORazepam  Allergies:  Allergies  Allergen Reactions  . Dapsone Other (See Comments)    Per centricity "G6PD deficient"  . Primaquine Phosphate Other (See Comments)    Per Centricity "G6PD deficient"    Family History  Problem Relation Age of Onset  . Alcohol abuse Mother   . Schizophrenia Father   . Depression Father   . Alcohol abuse Father   . Alcohol abuse Paternal Uncle   . Alcohol abuse Paternal Uncle     Social History:  reports that he has been smoking cigarettes. He has been smoking about 1.00 pack per day. He has never used smokeless tobacco. He reports current alcohol use of about 14.0 standard drinks of alcohol per week. He reports that he does not use drugs.  ROS: A complete review of systems was performed.  All systems are negative except for pertinent findings as noted.  Physical Exam:  Vital signs in last 24 hours: Temp:  [98.6 F (37 C)-99.8 F (37.7 C)] 99.4 F (37.4 C) (06/07 1334) Pulse Rate:  [18-99] 99 (06/07 1334) Resp:  [16-96] 21 (06/07 1334) BP: (96-135)/(53-85) 103/54 (06/07 1334) SpO2:  [92 %-97 %] 92 % (06/07 1334) Weight:  [123.9 kg-126 kg] 123.9 kg (06/07 0643) Constitutional:  Alert and oriented, No acute distress Cardiovascular: Regular rate and rhythm Respiratory: Normal respiratory effort GI: Abdomen is soft, nontender, nondistended, no abdominal masses. Very small open wound on LLQ with no drainage or erythema  GU: condom cath in place with dark bloody urine in foley  bag and dark yellow urine in tubing; mons pubis with plaque-like skin and pock marks; no open wounds and no fluctuance noted. Lymphatic: No lymphadenopathy Neurologic: Grossly intact, no focal deficits Psychiatric: Normal mood and affect  Laboratory Data:  Recent Labs    03/14/20 1947 03/15/20 1002 03/16/20 0446  WBC 12.9* 12.3* 10.1  HGB 8.4* 8.3* 7.2*  HCT 26.3* 25.9* 22.5*  PLT 111* 121* 124*    Recent Labs    03/14/20 1947 03/15/20 1002 03/16/20 0446  NA 127* 130* 128*  K 3.1* 3.0* 2.6*  CL 89* 94* 93*  GLUCOSE 92 93 124*  BUN 10 10 10   CALCIUM 8.8* 8.6* 8.2*  CREATININE 0.94 0.85 0.90  Results for orders placed or performed during the hospital encounter of 03/14/20 (from the past 24 hour(s))  Comprehensive metabolic panel     Status: Abnormal   Collection Time: 03/16/20  4:46 AM  Result Value Ref Range   Sodium 128 (L) 135 - 145 mmol/L   Potassium 2.6 (LL) 3.5 - 5.1 mmol/L   Chloride 93 (L) 98 - 111 mmol/L   CO2 24 22 - 32 mmol/L   Glucose, Bld 124 (H) 70 - 99 mg/dL   BUN 10 6 - 20 mg/dL   Creatinine, Ser 0.90 0.61 - 1.24 mg/dL   Calcium 8.2 (L) 8.9 - 10.3 mg/dL   Total Protein 6.9 6.5 - 8.1 g/dL   Albumin 2.0 (L) 3.5 - 5.0 g/dL   AST 315 (H) 15 - 41 U/L   ALT 103 (H) 0 - 44 U/L   Alkaline Phosphatase 158 (H) 38 - 126 U/L   Total Bilirubin 10.1 (H) 0.3 - 1.2 mg/dL   GFR calc non Af Amer >60 >60 mL/min   GFR calc Af Amer >60 >60 mL/min   Anion gap 11 5 - 15  CBC WITH DIFFERENTIAL     Status: Abnormal   Collection Time: 03/16/20  4:46 AM  Result Value Ref Range   WBC 10.1 4.0 - 10.5 K/uL   RBC 2.14 (L) 4.22 - 5.81 MIL/uL   Hemoglobin 7.2 (L) 13.0 - 17.0 g/dL   HCT 22.5 (L) 39.0 - 52.0 %   MCV 105.1 (H) 80.0 - 100.0 fL   MCH 33.6 26.0 - 34.0 pg   MCHC 32.0 30.0 - 36.0 g/dL   RDW 18.8 (H) 11.5 - 15.5 %   Platelets 124 (L) 150 - 400 K/uL   nRBC 0.0 0.0 - 0.2 %   Neutrophils Relative % 74 %   Neutro Abs 7.5 1.7 - 7.7 K/uL   Lymphocytes Relative 11  %   Lymphs Abs 1.1 0.7 - 4.0 K/uL   Monocytes Relative 11 %   Monocytes Absolute 1.2 (H) 0.1 - 1.0 K/uL   Eosinophils Relative 1 %   Eosinophils Absolute 0.1 0.0 - 0.5 K/uL   Basophils Relative 1 %   Basophils Absolute 0.1 0.0 - 0.1 K/uL   Immature Granulocytes 2 %   Abs Immature Granulocytes 0.18 (H) 0.00 - 0.07 K/uL  Protime-INR     Status: Abnormal   Collection Time: 03/16/20  4:46 AM  Result Value Ref Range   Prothrombin Time 16.0 (H) 11.4 - 15.2 seconds   INR 1.3 (H) 0.8 - 1.2   Recent Results (from the past 240 hour(s))  SARS Coronavirus 2 by RT PCR (hospital order, performed in [place] hospital lab) Nasopharyngeal Nasopharyngeal Swab     Status: None   Collection Time: 03/14/20  9:02 PM   Specimen: Nasopharyngeal Swab  Result Value Ref Range Status   SARS Coronavirus 2 NEGATIVE NEGATIVE Final    Comment: (NOTE) SARS-CoV-2 target nucleic acids are NOT DETECTED. The SARS-CoV-2 RNA is generally detectable in upper and lower respiratory specimens during the acute phase of infection. The lowest concentration of SARS-CoV-2 viral copies this assay can detect is 250 copies / mL. A negative result does not preclude SARS-CoV-2 infection and should not be used as the sole basis for treatment or other patient management decisions.  A negative result may occur with improper specimen collection / handling, submission of specimen other than nasopharyngeal swab, presence of viral mutation(s) within the areas targeted by this assay, and inadequate number  of viral copies (<250 copies / mL). A negative result must be combined with clinical observations, patient history, and epidemiological information. Fact Sheet for Patients:   StrictlyIdeas.no Fact Sheet for Healthcare Providers: BankingDealers.co.za This test is not yet approved or cleared  by the [place] FDA and has been authorized for detection and/or diagnosis of SARS-CoV-2  by FDA under an Emergency Use Authorization (EUA).  This EUA will remain in effect (meaning this test can be used) for the duration of the COVID-19 declaration under Section 564(b)(1) of the Act, 21 U.S.C. section 360bbb-3(b)(1), unless the authorization is terminated or revoked sooner. Performed at [facility], [place] [address] [place], [place] [address]   Urine culture     Status: None (Preliminary result)   Collection Time: 03/14/20 10:31 PM   Specimen: In/Out Cath Urine  Result Value Ref Range Status   Specimen Description   Final    IN/OUT CATH URINE Performed at [place] [address], [place], [place] [address]    Special Requests   Final    NONE Performed at [facility], [place] [address] [place], [place] [address]    Culture   Final    CULTURE REINCUBATED FOR BETTER GROWTH Performed at [facility], [place] [address], [place], [place] [address]    Report Status PENDING  Incomplete  Blood Culture (routine x 2)     Status: None (Preliminary result)   Collection Time: 03/14/20 10:50 PM   Specimen: BLOOD  Result Value Ref Range Status   Specimen Description   Final    BLOOD LEFT ANTECUBITAL Performed at [facility] [address] [place], [place] [address]    Special Requests   Final    BOTTLES DRAWN AEROBIC AND ANAEROBIC Blood Culture adequate volume Performed at [facility] [address], [place], [place] [address]    Culture   Final    NO GROWTH 1 DAY Performed at [facility], [place] [address], [place], [place] [address]    Report Status PENDING  Incomplete  Blood Culture (routine x 2)     Status: None (Preliminary result)   Collection Time: 03/15/20 10:02 AM   Specimen: BLOOD  Result Value Ref Range Status   Specimen Description   Final    BLOOD RIGHT ARM Performed at [facility] [address], [place], [place] [address]     Special Requests   Final    BOTTLES DRAWN AEROBIC ONLY Blood Culture adequate volume Performed at [facility] [address] [place], [place] [address]    Culture   Final    NO GROWTH < 24 HOURS Performed at [facility] [address], [place], [place] [address]    Report Status PENDING  Incomplete    Renal Function: Recent Labs    03/14/20 1947 03/15/20 1002 03/16/20 0446  CREATININE 0.94 0.85 0.90   Estimated Creatinine Clearance: 161 mL/min (by C-G formula based on SCr of 0.9 mg/dL).  Radiologic Imaging: CT Head Wo Contrast  Result Date: 03/14/2020 CLINICAL DATA:  Dizziness and lethargy. EXAM: CT HEAD WITHOUT CONTRAST TECHNIQUE: Contiguous axial images were obtained from the base of the skull through the vertex without intravenous contrast. COMPARISON:  October 27, 2019 FINDINGS: Brain: No evidence of acute infarction, hemorrhage, hydrocephalus, extra-axial collection or mass lesion/mass effect. There is mild bilateral basal ganglia calcification. Vascular: No hyperdense vessel or unexpected calcification. Skull: Normal.  Negative for fracture or focal lesion. Sinuses/Orbits: No acute finding. Other: None. IMPRESSION: No acute intracranial pathology. Electronically Signed   By: Virgina Norfolk M.D.   On: 03/14/2020 22:14   CT ABDOMEN PELVIS W CONTRAST  Result Date: 03/14/2020 CLINICAL DATA:  Jaundice, burning urination, history of HIV and COVID-19 EXAM: CT ABDOMEN AND PELVIS WITH CONTRAST TECHNIQUE: Multidetector CT imaging of the abdomen and pelvis was performed using the standard protocol following bolus administration of intravenous contrast. CONTRAST:  133m OMNIPAQUE IOHEXOL 300 MG/ML  SOLN COMPARISON:  MRI 08/27/2018, ultrasound 08/01/2018 FINDINGS: Lower chest: Few patchy multifocal ground-glass opacities in the lung bases. No effusion. Normal heart size. No pericardial effusion. Hepatobiliary: Mild hepatomegaly with diffuse hepatic hypoattenuation  sparing along the gallbladder fossa most compatible with hepatic steatosis seen on comparison MR. Smooth liver surface contour. No focal liver lesion. Layering gallstones within the otherwise normal gallbladder. No visible intraductal gallstones or biliary dilatation. Pancreas: There is some questionable stranding versus motion artifact near the body and tail of the pancreas. No focal concerning lesion. No duct dilatation. Spleen: Normal in size without focal abnormality. Adrenals/Urinary Tract: Normal adrenal glands. Kidneys enhance symmetrically. No concerning renal lesion. No urolithiasis or hydronephrosis. Some mild urothelial thickening of the ureters is noted. There is circumferential bladder wall thickening with mucosal hyperemia and small focus of intraluminal gas. Stomach/Bowel: Distal esophagus, stomach and duodenal sweep are unremarkable. No small bowel wall thickening or dilatation. No evidence of obstruction. A normal appendix is visualized. No colonic dilatation or wall thickening. Vascular/Lymphatic: Atherosclerotic calcifications throughout the abdominal aorta and branch vessels. No aneurysm or ectasia. Several borderline enlarged and possibly reactive nodes are present in the inguinal chains. No other enlarged lymph nodes. Reproductive: The prostate and seminal vesicles are unremarkable. Included portions of the external genitalia are unremarkable. Other: Multiple areas of skin thickening and subcutaneous stranding as well as possible developing rim enhancing collections in the subcutaneous soft tissues of the anterior mons pubis including a collection measuring 1.8 x 2.0 x 1.7 cm. Stranding and inflammation from this location extends to the base of the penile shaft. Additional areas of focal skin thickening noted along the right gluteal cleft and anterior thigh soft tissues. Small fat containing umbilical hernia. No abdominopelvic free air or fluid. 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 No other acute or suspicious osseous lesions. IMPRESSION: 1. Multiple areas of skin thickening and subcutaneous stranding as well as possible developing rim enhancing collections in the subcutaneous soft tissues, most pronounced of the anterior mons pubis including a subcutaneous collection measuring 1.8 x 2.0 x 1.7 cm. Stranding and inflammation from this location extends to the base of the penile shaft. Additional areas of focal skin thickening noted along the right gluteal cleft and anterior thigh soft tissues. Findings are concerning for cellulitis with possible developing abscesses. Recommend correlation with direct visual inspection. 2. Circumferential bladder wall thickening with mucosal hyperemia and small focus of intraluminal gas. Findings are concerning for cystitis with ascending urinary tract infection. 3. 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 4. Few patchy multifocal ground-glass opacities in the lung bases compatible with COVID-19 pneumonia. 5. Mild hepatomegaly with diffuse hepatic hypoattenuation sparing along the gallbladder fossa most compatible with hepatic steatosis seen on comparison MR. 6. Cholelithiasis without evidence of acute cholecystitis. If there is persisting  clinical concern, correlate with right upper quadrant ultrasound. 7. Aortic Atherosclerosis (ICD10-I70.0). These results were called by telephone at the time of interpretation on 03/14/2020 at 10:20 pm to provider [facility] , who verbally acknowledged these results. Electronically Signed   By: Lovena Le M.D.   On: 03/14/2020 22:20   US Abdomen Limited  Result Date: 03/14/2020 CLINICAL DATA:  Transaminitis.  Gallstone. EXAM: ULTRASOUND ABDOMEN LIMITED RIGHT UPPER QUADRANT COMPARISON:  Abdominal CT  earlier today. Abdominal ultrasound 08/01/2018. FINDINGS: Gallbladder: Physiologically distended. Layering sludge but no evidence of shadowing stone. No wall thickening or pericholecystic fluid. No sonographic Murphy sign noted by sonographer. Common bile duct: Diameter: 6 mm. Liver: No focal lesion identified. Diffusely increased and heterogeneous in parenchymal echogenicity. Portal vein is patent on color Doppler imaging with normal direction of blood flow towards the liver. Other: No right upper quadrant ascites. IMPRESSION: 1. Layering sludge in the gallbladder without stones or sonographic findings of acute cholecystitis. No biliary dilatation. 2. Diffusely increased and heterogeneous parenchymal echogenicity consistent with steatosis. Electronically Signed   By: Keith Rake M.D.   On: 03/14/2020 23:25   DG CHEST PORT 1 VIEW  Result Date: 03/15/2020 CLINICAL DATA:  Lethargy.  Altered mental status.  Wheezing. EXAM: PORTABLE CHEST 1 VIEW COMPARISON:  03/14/2020 FINDINGS: Stable elevation of the RIGHT hemidiaphragm. Heart size is mildly enlarged. There is increased perihilar infiltrate and pulmonary vascular congestion, increased. IMPRESSION: Cardiomegaly and pulmonary vascular congestion. Electronically Signed   By: Nolon Nations M.D.   On: 03/15/2020 09:24   DG Chest Port 1 View  Result Date: 03/14/2020 CLINICAL DATA:  Dizziness and lethargy. EXAM: PORTABLE CHEST 1 VIEW COMPARISON:  October 27, 2019 FINDINGS: There is no evidence of acute infiltrate, pleural effusion or pneumothorax. There is mild, stable elevation of the right hemidiaphragm. The heart size and mediastinal contours are within normal limits. The visualized skeletal structures are unremarkable. IMPRESSION: No active disease. Electronically Signed   By: Virgina Norfolk M.D.   On: 03/14/2020 21:33     Impression/Recommendation  Hematuria--due to bladder wall irritation from UTI.  Per pt it is intermittent. CT shows normal  kidneys/ureters with no stones or tumors. Keep pt hydrated to flush bladder in attempt to prevent clot formation. If hematuria persists after infection has cleared he will need a cysto (could be performed as an outpt if necessary).   UTI--continue ABx and adjust as necessary per culture results.   Cellulitis--monitor and continue ABx.    Debbrah Alar 03/16/2020, 3:10 PM

## 2020-03-17 DIAGNOSIS — L089 Local infection of the skin and subcutaneous tissue, unspecified: Secondary | ICD-10-CM

## 2020-03-17 DIAGNOSIS — K700 Alcoholic fatty liver: Secondary | ICD-10-CM

## 2020-03-17 DIAGNOSIS — G40909 Epilepsy, unspecified, not intractable, without status epilepticus: Secondary | ICD-10-CM

## 2020-03-17 DIAGNOSIS — K759 Inflammatory liver disease, unspecified: Secondary | ICD-10-CM

## 2020-03-17 LAB — COMPREHENSIVE METABOLIC PANEL
ALT: 82 U/L — ABNORMAL HIGH (ref 0–44)
AST: 202 U/L — ABNORMAL HIGH (ref 15–41)
Albumin: 2 g/dL — ABNORMAL LOW (ref 3.5–5.0)
Alkaline Phosphatase: 142 U/L — ABNORMAL HIGH (ref 38–126)
Anion gap: 11 (ref 5–15)
BUN: 8 mg/dL (ref 6–20)
CO2: 23 mmol/L (ref 22–32)
Calcium: 8.6 mg/dL — ABNORMAL LOW (ref 8.9–10.3)
Chloride: 99 mmol/L (ref 98–111)
Creatinine, Ser: 0.87 mg/dL (ref 0.61–1.24)
GFR calc Af Amer: >60 mL/min (ref >60)
GFR calc non Af Amer: >60 mL/min (ref >60)
Glucose, Bld: 107 mg/dL — ABNORMAL HIGH (ref 70–99)
Potassium: 2.9 mmol/L — ABNORMAL LOW (ref 3.5–5.1)
Sodium: 133 mmol/L — ABNORMAL LOW (ref 135–145)
Total Bilirubin: 9.5 mg/dL — ABNORMAL HIGH (ref 0.3–1.2)
Total Protein: 6.8 g/dL (ref 6.5–8.1)

## 2020-03-17 LAB — CBC WITH DIFFERENTIAL/PLATELET
Abs Immature Granulocytes: 0.5 10*3/uL — ABNORMAL HIGH (ref 0.00–0.07)
Basophils Absolute: 0.1 10*3/uL (ref 0.0–0.1)
Basophils Relative: 1 %
Eosinophils Absolute: 0.2 10*3/uL (ref 0.0–0.5)
Eosinophils Relative: 1 %
HCT: 22.2 % — ABNORMAL LOW (ref 39.0–52.0)
Hemoglobin: 7 g/dL — ABNORMAL LOW (ref 13.0–17.0)
Immature Granulocytes: 4 %
Lymphocytes Relative: 11 %
Lymphs Abs: 1.4 10*3/uL (ref 0.7–4.0)
MCH: 34.5 pg — ABNORMAL HIGH (ref 26.0–34.0)
MCHC: 31.5 g/dL (ref 30.0–36.0)
MCV: 109.4 fL — ABNORMAL HIGH (ref 80.0–100.0)
Monocytes Absolute: 1.5 10*3/uL — ABNORMAL HIGH (ref 0.1–1.0)
Monocytes Relative: 12 %
Neutro Abs: 8.5 10*3/uL — ABNORMAL HIGH (ref 1.7–7.7)
Neutrophils Relative %: 71 %
Platelets: 134 10*3/uL — ABNORMAL LOW (ref 150–400)
RBC: 2.03 MIL/uL — ABNORMAL LOW (ref 4.22–5.81)
RDW: 19.3 % — ABNORMAL HIGH (ref 11.5–15.5)
WBC: 12.1 10*3/uL — ABNORMAL HIGH (ref 4.0–10.5)
nRBC: 0.2 % (ref 0.0–0.2)

## 2020-03-17 LAB — PREPARE RBC (CROSSMATCH)

## 2020-03-17 LAB — PROTIME-INR
INR: 1.4 — ABNORMAL HIGH (ref 0.8–1.2)
Prothrombin Time: 16.3 seconds — ABNORMAL HIGH (ref 11.4–15.2)

## 2020-03-17 MED ORDER — POTASSIUM CHLORIDE 10 MEQ/100ML IV SOLN
10.0000 meq | INTRAVENOUS | Status: AC
  Administered 2020-03-17 (×4): 10 meq via INTRAVENOUS
  Filled 2020-03-17 (×4): qty 100

## 2020-03-17 MED ORDER — SODIUM CHLORIDE 0.9% IV SOLUTION: Freq: Once | INTRAVENOUS | Status: DC

## 2020-03-17 MED ORDER — AMOXICILLIN-POT CLAVULANATE 875-125 MG PO TABS
1.0000 | ORAL_TABLET | Freq: Two times a day (BID) | ORAL | Status: DC
  Administered 2020-03-17 – 2020-03-20 (×7): 1 via ORAL
  Filled 2020-03-17 (×7): qty 1

## 2020-03-17 MED ORDER — POTASSIUM CHLORIDE 20 MEQ PO PACK
40.0000 meq | PACK | Freq: Once | ORAL | Status: AC
  Administered 2020-03-17: 40 meq via ORAL
  Filled 2020-03-17: qty 2

## 2020-03-17 MED ORDER — GUAIFENESIN-DM 100-10 MG/5ML PO SYRP
5.0000 mL | ORAL_SOLUTION | ORAL | Status: DC | PRN
  Administered 2020-03-17 – 2020-03-18 (×2): 5 mL via ORAL
  Filled 2020-03-17 (×2): qty 10

## 2020-03-17 MED ORDER — BICTEGRAVIR-EMTRICITAB-TENOFOV 50-200-25 MG PO TABS
1.0000 | ORAL_TABLET | Freq: Every day | ORAL | Status: DC
  Administered 2020-03-17 – 2020-03-19 (×3): 1 via ORAL
  Filled 2020-03-17 (×3): qty 1

## 2020-03-17 NOTE — Care Management Important Message (Signed)
Important Message  Patient Details IM Letter given to Gabriel Earing RN Case Manager to present to the Patient Name: [name] MRN: [id number] Date of Birth: 1985-12-10   Medicare Important Message Given:  Yes     Kerin [place]en 03/17/2020, 11:15 AM

## 2020-03-17 NOTE — Progress Notes (Signed)
A [facility] miscellaneous test- leptospirosis antigen was ordered by ID. Lab called and said this test does not exist. Our lab reached out to [facility] who in turn reached out to multple other people about this lab draw. They have never heard of the antigen portion of this test. RN called [facility] coverage and they asked [place] to reach out to the ordering provider, however he is not on at the time. RN will pass the message on so that we can figure this out.

## 2020-03-17 NOTE — Progress Notes (Signed)
[name] 11:20 AM  Subjective: Patient doing well without any GI complaints and her liver biopsy in January just showed steatohepatitis  Objective: Vital signs stable afebrile no acute distress abdomen is soft nontender LFTs trending downward other labs stable  Assessment: Multiple medical problems including elevated liver tests chronic anemia  Plan: Continue to follow liver tests and will check on later in the week and call sooner if any specific question or problem and have pharmacy team make sure no hepatotoxic medicines being used  [facility]  office [phone] After 5PM or if no answer call [phone]

## 2020-03-17 NOTE — Consult Note (Signed)
Date of Admission:  03/14/2020          Reason for Consult: hepatitis in patient with HIV, alcoholism,fatty liver disease  seizure disorder    Referring Provider: Dr .Hilliard Clark    Assessment:  1. Suspect hepatic failure due to alcohol abuse superimposed on 2. Fatty liver disease 3. HIV well controlled on Biktarvy (past 3 years on this regimen) 4. Soft tissue infection in abdominal wall 5. CLaims of dysuria which he now denies 6. Encephalopathy   Plan:  1. Continue to trend LFTS', if worsens should go to tertiary care center with transplant expertise and have repeat liver biopsy 2. Check RPR, CMV, EBV serologies, leptospirosis ag 3. Resume Biktarvy 4. Narrow to augmentin for his soft tissue infection 5. He needs to stop drinking altogether   I will sign off for now.  Please call with further questions.  Principal Problem:   Sepsis secondary to UTI [facility]) Active Problems:   Schizoaffective disorder, depressive type (HCC)   Severe alcohol dependence (HCC)   Hypokalemia   Cellulitis of groin   Elevated LFTs   HIV disease (HCC)   Acute encephalopathy   Hyponatremia   Macrocytic anemia   Thrombocytopenia (HCC)   Prolonged QT interval   Scheduled Meds: . amoxicillin-clavulanate  1 tablet Oral Q12H  . bictegravir-emtricitabine-tenofovir AF  1 tablet Oral Daily  . folic acid  1 mg Oral Daily  . lactulose  10 g Oral Daily  . LORazepam  0-4 mg Intravenous Q12H  . nicotine  14 mg Transdermal Daily  . pantoprazole  40 mg Oral q morning - 10a  . sodium chloride flush  3 mL Intravenous Q12H  . thiamine  100 mg Oral Daily  . topiramate  100 mg Oral QHS  . valACYclovir  1,000 mg Oral Daily   Continuous Infusions: PRN Meds:.ipratropium-albuterol, LORazepam **OR** LORazepam  HPI: [name] is a 34 y.o. male with longstanding HIV disease that has been perfectly controlled on various regimens, most recently [place] and followed by [facility], also with comorbid  schizoaffective disorder, alcoholism and fatty liver disease. He had been hospitalized in January with COVID 19. He tells me that he came to the ER "because I couldn't walk" He was lethargic and disoriented in there ER. His mother stated that paitent had been complaining of dysuria x week. In ER he was hypotensive but responded to fluid and tachycardic. His admission labs signficant for elevated transaminases with markedly elevated bilirubin. CT performed showed sludge in GB as well as ? areao f soft tissue abscess in the pubic area, hepatomegaly. He has had prior liver biopsy at [facility] that had shown fatty liver. Possible cystitis seen on CT scan as well .He admitted to team that he drinks daily though he told me only 1-2 beers. I suspect he is greatly minimizing and that he consumes quite a bit more alcohol. He has been treated with vancomycin, cefepime and metronidazole for sepsis and for soft tissue area in suprapubic area. On exam he has no pain in that area and no purulent drainage.   I do not think he has any evidence of sepsis and I think the findings on his abdominal exam are under whelming.  I would treat this area with augmentin. I am skeptical he had UTI and if he did he already has received 3 days of cefepime  I have zero suspicion for drug induced liver injury from [facility] though perhaps some of his other meds could  be considered.   I will restart Biktarvy tonight   Review of Systems: Review of Systems  Constitutional: Positive for malaise/fatigue. Negative for chills, fever and weight loss.  HENT: Negative for congestion and sore throat.   Eyes: Negative for blurred vision and photophobia.  Respiratory: Negative for cough, shortness of breath and wheezing.   Cardiovascular: Negative for chest pain, palpitations and leg swelling.  Gastrointestinal: Negative for abdominal pain, blood in stool, constipation, diarrhea, heartburn, melena, nausea and vomiting.  Genitourinary: Negative for  dysuria, flank pain and hematuria.  Musculoskeletal: Negative for back pain, falls, joint pain and myalgias.  Skin: Negative for itching and rash.  Neurological: Positive for dizziness. Negative for focal weakness, loss of consciousness, weakness and headaches.  Endo/Heme/Allergies: Does not bruise/bleed easily.  Psychiatric/Behavioral: Positive for depression. Negative for suicidal ideas. The patient does not have insomnia.     Past Medical History:  Diagnosis Date  . Bipolar 1 disorder ([place])   . Depression   . Dizziness and giddiness 02/01/2016  . Herpes genitalia   . HIV disease ([place])   . Hypertension   . Migraine headache 02/01/2016  . Peripheral neuropathy 10/01/2019  . PTSD (post-traumatic stress disorder)   . Schizoaffective disorder ([place])   . Seizures ([place])     Social History   Tobacco Use  . Smoking status: Current Every Day Smoker    Packs/day: 1.00    Types: Cigarettes  . Smokeless tobacco: Never Used  Substance Use Topics  . Alcohol use: Yes    Alcohol/week: 14.0 standard drinks    Types: 14 Cans of beer per week    Comment: 5-6 40oz per day  . Drug use: No    Family History  Problem Relation Age of Onset  . Alcohol abuse Mother   . Schizophrenia Father   . Depression Father   . Alcohol abuse Father   . Alcohol abuse Paternal Uncle   . Alcohol abuse Paternal Uncle    Allergies  Allergen Reactions  . Dapsone Other (See Comments)    Per centricity "G6PD deficient"  . Primaquine Phosphate Other (See Comments)    Per Centricity "G6PD deficient"    OBJECTIVE: Blood pressure (!) 110/52, pulse 96, temperature 98.4 F (36.9 C), temperature source Oral, resp. rate 18, height 6' 3"  (1.905 m), weight 123.9 kg, SpO2 92 %.  Physical Exam Constitutional:      General: He is not in acute distress.    Appearance: Normal appearance. He is well-developed. He is not ill-appearing or diaphoretic.  HENT:     Head: Normocephalic and atraumatic.     Right Ear:  Hearing and external ear normal.     Left Ear: Hearing and external ear normal.     Nose: No nasal deformity or rhinorrhea.  Eyes:     General: No scleral icterus.    Conjunctiva/sclera: Conjunctivae normal.     Right eye: Right conjunctiva is not injected.     Left eye: Left conjunctiva is not injected.     Pupils: Pupils are equal, round, and reactive to light.  Neck:     Vascular: No JVD.  Cardiovascular:     Rate and Rhythm: Normal rate and regular rhythm.     Heart sounds: Normal heart sounds, S1 normal and S2 normal. No murmur. No friction rub.  Pulmonary:     Effort: Pulmonary effort is normal. No respiratory distress.     Breath sounds: Normal breath sounds. No stridor. No wheezing.  Abdominal:  General: Bowel sounds are normal. There is no distension.     Palpations: Abdomen is soft.  Musculoskeletal:        General: Normal range of motion.     Right shoulder: Normal.     Left shoulder: Normal.     Cervical back: Normal range of motion and neck supple.     Right hip: Normal.     Left hip: Normal.     Right knee: Normal.     Left knee: Normal.  Lymphadenopathy:     Head:     Right side of head: No submandibular, preauricular or posterior auricular adenopathy.     Left side of head: No submandibular, preauricular or posterior auricular adenopathy.     Cervical: No cervical adenopathy.     Right cervical: No superficial or deep cervical adenopathy.    Left cervical: No superficial or deep cervical adenopathy.  Skin:    General: Skin is warm and dry.     Coloration: Skin is not pale.     Findings: No abrasion, bruising, ecchymosis, erythema, lesion or rash.     Nails: There is no clubbing.  Neurological:     General: No focal deficit present.     Mental Status: He is alert and oriented to person, place, and time.     Sensory: No sensory deficit.     Coordination: Coordination normal.  Psychiatric:        Attention and Perception: He is attentive.        Mood  and Affect: Mood normal.        Speech: Speech normal.        Behavior: Behavior normal. Behavior is cooperative.        Thought Content: Thought content normal.        Judgment: Judgment normal.     Abdomen: 03/17/2020:      Lab Results Lab Results  Component Value Date   WBC 12.1 (H) 03/17/2020   HGB 7.0 (L) 03/17/2020   HCT 22.2 (L) 03/17/2020   MCV 109.4 (H) 03/17/2020   PLT 134 (L) 03/17/2020    Lab Results  Component Value Date   CREATININE 0.87 03/17/2020   BUN 8 03/17/2020   NA 133 (L) 03/17/2020   K 2.9 (L) 03/17/2020   CL 99 03/17/2020   CO2 23 03/17/2020    Lab Results  Component Value Date   ALT 82 (H) 03/17/2020   AST 202 (H) 03/17/2020   ALKPHOS 142 (H) 03/17/2020   BILITOT 9.5 (H) 03/17/2020     Microbiology: Recent Results (from the past 240 hour(s))  SARS Coronavirus 2 by RT PCR (hospital order, performed in [facility] hospital lab) Nasopharyngeal Nasopharyngeal Swab     Status: None   Collection Time: 03/14/20  9:02 PM   Specimen: Nasopharyngeal Swab  Result Value Ref Range Status   SARS Coronavirus 2 NEGATIVE NEGATIVE Final    Comment: (NOTE) SARS-CoV-2 target nucleic acids are NOT DETECTED. The SARS-CoV-2 RNA is generally detectable in upper and lower respiratory specimens during the acute phase of infection. The lowest concentration of SARS-CoV-2 viral copies this assay can detect is 250 copies / mL. A negative result does not preclude SARS-CoV-2 infection and should not be used as the sole basis for treatment or other patient management decisions.  A negative result may occur with improper specimen collection / handling, submission of specimen other than nasopharyngeal swab, presence of viral mutation(s) within the areas targeted by this assay, and inadequate number  of viral copies (<250 copies / mL). A negative result must be combined with clinical observations, patient history, and epidemiological information. Fact Sheet for Patients:    StrictlyIdeas.no Fact Sheet for Healthcare Providers: BankingDealers.co.za This test is not yet approved or cleared  by the [place] FDA and has been authorized for detection and/or diagnosis of SARS-CoV-2 by FDA under an Emergency Use Authorization (EUA).  This EUA will remain in effect (meaning this test can be used) for the duration of the COVID-19 declaration under Section 564(b)(1) of the Act, 21 U.S.C. section 360bbb-3(b)(1), unless the authorization is terminated or revoked sooner. Performed at [facility], [place] [address], [place], [place] [address]   Urine culture     Status: None (Preliminary result)   Collection Time: 03/14/20 10:31 PM   Specimen: In/Out Cath Urine  Result Value Ref Range Status   Specimen Description   Final    IN/OUT CATH URINE Performed at [place] [address], [place], [place] [address]    Special Requests   Final    NONE Performed at [facility], [place] [address], [place], [place] [address]    Culture   Final    CULTURE REINCUBATED FOR BETTER GROWTH Performed at [facility], [place] [address] [place], [place] [address]    Report Status PENDING  Incomplete  Blood Culture (routine x 2)     Status: None (Preliminary result)   Collection Time: 03/14/20 10:50 PM   Specimen: BLOOD  Result Value Ref Range Status   Specimen Description   Final    BLOOD LEFT ANTECUBITAL Performed at [place] [address] [place], [place] [address]    Special Requests   Final    BOTTLES DRAWN AEROBIC AND ANAEROBIC Blood Culture adequate volume Performed at [place] [address] [place], [place] [address]    Culture   Final    NO GROWTH 2 DAYS Performed at [place] [address] [place], [place] [address]    Report Status PENDING  Incomplete  Blood Culture (routine x 2)      Status: None (Preliminary result)   Collection Time: 03/15/20 10:02 AM   Specimen: BLOOD  Result Value Ref Range Status   Specimen Description   Final    BLOOD RIGHT ARM Performed at [facility] [address], [place], [place] [address]    Special Requests   Final    BOTTLES DRAWN AEROBIC ONLY Blood Culture adequate volume Performed at [place] [address], [place], [place] [address]    Culture   Final    NO GROWTH 2 DAYS Performed at [facility] [place] [address], [place],  [address]    Report Status PENDING  Incomplete    Alcide Evener, [place] [facility] [place] [phone] pager  03/17/2020, 1:40 PM

## 2020-03-17 NOTE — TOC Progression Note (Signed)
Transition of Care [facility]) - Progression Note    Patient Details  Name: [name] MRN: [id number] Date of Birth: 27-Drew-1987  Transition of Care [facility]) CM/SW Contact  Purcell Mouton, RN Phone Number: 03/17/2020, 3:50 PM  Clinical Narrative:    TOC will continue to follow pt for discharge needs.    Expected Discharge Plan: Home/Self Care Barriers to Discharge: No Barriers Identified  Expected Discharge Plan and Services Expected Discharge Plan: Home/Self Care   Discharge Planning Services: CM Consult   Living arrangements for the past 2 months: Single Family Home                                       Social Determinants of Health (SDOH) Interventions    Readmission Risk Interventions Readmission Risk Prevention Plan 11/01/2019  Home Care Screening Complete  Medication Review (RN CM) Complete  Some recent data might be hidden

## 2020-03-17 NOTE — Progress Notes (Signed)
PROGRESS NOTE    [name]  LAG:[id number]  DOB: August 31, 1986  PCP: [name], MD Admit date:03/14/2020 34 year old male with history of HIV, seizures, schizoaffective disorder, alcohol dependence who was admitted with Covid pneumonia in 10/2019 presented to ED now with lethargy, dysuria and confusion.  BP 90/60 with EMS that improved in ED. ED Course: Afebrile,saturating low 90s on room air, slightly tachycardic, and with stable blood pressure. sodium 127, potassium 3.1, alkaline phosphatase 187, albumin 2.3, AST "42, ALT 60, and total bilirubin 11.2.  CBC features a new leukocytosis to 12,900, worsened chronic anemia with hemoglobin of 8.4, and thrombocytopenia with platelets 111,000.  Fecal occult blood testing is negative, acetaminophen level undetectable, COVID-19 screening test also negative.  Lactic acid was elevated to 2.5, then down to 1.6 with fluids.  INR is 1.4.  CXR unremarkable. UA positive for UTI.CT head negative for acute intracranial abnormality.  CT abdomen pelvis showed cellulitis, possibly developing abscess in the pubic area, bloating, hepatomegaly, cholelithiasis without evidence of acute cholecystitis. Cystitis with ascending UTI, no biliary dilatation or focal lesions.RUQ ultrasound reported layering sludge in the gallbladder without acute cholecystitis or biliary dilatation Hospital course: Patient received Vanc/cefepime/flagyl and admitted to [facility] for further evaluation and management of sepsis, cystitis, hepatic failure and metabolic encephalopathy.  Subjective:  Patient  appears more awake alert today. Denies any pain/diarrhea. Patient reports having a nl BM this morning after 3 days. T-max 99.8 during the hospital course so far.   Objective: Vitals:   03/16/20 1334 03/16/20 2130 03/17/20 0540 03/17/20 1305  BP: (!) 103/54 (!) 105/52 115/60 (!) 110/52  Pulse: 99 96 94 96  Resp: (!) 21 (!) 24 (!) 22 18  Temp: 99.4 F (37.4 C) 98.3 F (36.8 C) 99.5 F (37.5  C) 98.4 F (36.9 C)  TempSrc: Oral Oral Oral Oral  SpO2: 92% 92% 96% 92%  Weight:      Height:        Intake/Output Summary (Last 24 hours) at 03/17/2020 1722 Last data filed at 03/17/2020 1511 Gross per 24 hour  Intake 1704.24 ml  Output 1950 ml  Net -245.76 ml   Filed Weights   03/15/20 0500 03/15/20 1700 03/16/20 0643  Weight: 126.1 kg 126 kg 123.9 kg    Physical Examination:  General exam: Appears calm and comfortable  Respiratory system: Clear to auscultation. Respiratory effort normal. Cardiovascular system: S1 & S2 heard, RRR. No JVD, murmurs, rubs, gallops or clicks. No pedal edema. Gastrointestinal system: Abdomen is nondistended, soft and nontender. Normal bowel sounds heard. Central nervous system:  Drowsy and oriented. No new focal neurological deficits. Extremities: No contractures, edema or joint deformities.  Skin:  Indurated areas in pubic region and groin, no spontaneous drainage today with mild rt inguinal lymphadenopathy Psychiatry: Judgement and insight appear okay. Mood & affect appropriate.   Data Reviewed: I have personally reviewed following labs and imaging studies  CBC: Recent Labs  Lab 03/14/20 1947 03/15/20 1002 03/16/20 0446 03/17/20 0527  WBC 12.9* 12.3* 10.1 12.1*  NEUTROABS 10.3*  --  7.5 8.5*  HGB 8.4* 8.3* 7.2* 7.0*  HCT 26.3* 25.9* 22.5* 22.2*  MCV 106.9* 107.9* 105.1* 109.4*  PLT 111* 121* 124* 122*   Basic Metabolic Panel: Recent Labs  Lab 03/14/20 1947 03/15/20 1002 03/16/20 0446 03/16/20 1816 03/17/20 0527  NA 127* 130* 128*  --  133*  K 3.1* 3.0* 2.6* 2.9* 2.9*  CL 89* 94* 93*  --  99  CO2 22 22 24   --  23  GLUCOSE 92 93 124*  --  107*  BUN 10 10 10   --  8  CREATININE 0.94 0.85 0.90  --  0.87  CALCIUM 8.8* 8.6* 8.2*  --  8.6*  MG  --  2.3  --   --   --   PHOS  --  2.1*  --   --   --    GFR: Estimated Creatinine Clearance: 166.5 mL/min (by C-G formula based on SCr of 0.87 mg/dL). Liver Function Tests: Recent Labs   Lab 03/14/20 1947 03/15/20 1002 03/16/20 0446 03/17/20 0527  AST 442* 449* 315* 202*  ALT 116* 116* 103* 82*  ALKPHOS 187* 178* 158* 142*  BILITOT 11.2* 11.9* 10.1* 9.5*  PROT 7.8 7.5 6.9 6.8  ALBUMIN 2.3* 2.3* 2.0* 2.0*   Recent Labs  Lab 03/14/20 1947  LIPASE 60*   Recent Labs  Lab 03/14/20 1947  AMMONIA 29   Coagulation Profile: Recent Labs  Lab 03/14/20 2233 03/16/20 0446 03/17/20 0527  INR 1.4* 1.3* 1.4*   Cardiac Enzymes: No results for input(s): CKTOTAL, CKMB, CKMBINDEX, TROPONINI in the last 168 hours. BNP (last 3 results) No results for input(s): PROBNP in the last 8760 hours. HbA1C: No results for input(s): HGBA1C in the last 72 hours. CBG: No results for input(s): GLUCAP in the last 168 hours. Lipid Profile: No results for input(s): CHOL, HDL, LDLCALC, TRIG, CHOLHDL, LDLDIRECT in the last 72 hours. Thyroid Function Tests: No results for input(s): TSH, T4TOTAL, FREET4, T3FREE, THYROIDAB in the last 72 hours. Anemia Panel: Recent Labs    03/15/20 1002  VITAMINB12 1,145*  FOLATE 15.9  FERRITIN 1,292*  TIBC 126*  IRON 85  RETICCTPCT 3.2*   Sepsis Labs: Recent Labs  Lab 03/14/20 1947 03/14/20 2147  LATICACIDVEN 2.5* 1.6    Recent Results (from the past 240 hour(s))  SARS Coronavirus 2 by RT PCR (hospital order, performed in [facility] hospital lab) Nasopharyngeal Nasopharyngeal Swab     Status: None   Collection Time: 03/14/20  9:02 PM   Specimen: Nasopharyngeal Swab  Result Value Ref Range Status   SARS Coronavirus 2 NEGATIVE NEGATIVE Final    Comment: (NOTE) SARS-CoV-2 target nucleic acids are NOT DETECTED. The SARS-CoV-2 RNA is generally detectable in upper and lower respiratory specimens during the acute phase of infection. The lowest concentration of SARS-CoV-2 viral copies this assay can detect is 250 copies / mL. A negative result does not preclude SARS-CoV-2 infection and should not be used as the sole basis for treatment or  other patient management decisions.  A negative result may occur with improper specimen collection / handling, submission of specimen other than nasopharyngeal swab, presence of viral mutation(s) within the areas targeted by this assay, and inadequate number of viral copies (<250 copies / mL). A negative result must be combined with clinical observations, patient history, and epidemiological information. Fact Sheet for Patients:   StrictlyIdeas.no Fact Sheet for Healthcare Providers: BankingDealers.co.za This test is not yet approved or cleared  by the [place] FDA and has been authorized for detection and/or diagnosis of SARS-CoV-2 by FDA under an Emergency Use Authorization (EUA).  This EUA will remain in effect (meaning this test can be used) for the duration of the COVID-19 declaration under Section 564(b)(1) of the Act, 21 U.S.C. section 360bbb-3(b)(1), unless the authorization is terminated or revoked sooner. Performed at [facility], [place] [address] [place], [place] [address]   Urine culture     Status: None (Preliminary  result)   Collection Time: 03/14/20 10:31 PM   Specimen: In/Out Cath Urine  Result Value Ref Range Status   Specimen Description   Final    IN/OUT CATH URINE Performed at [facility], [place] [address] [place], [place] [address]    Special Requests   Final    NONE Performed at [facility], [place] [address], [place], [place] [address]    Culture   Final    CULTURE REINCUBATED FOR BETTER GROWTH Performed at [facility], [place] [address] [place], [place] [address]    Report Status PENDING  Incomplete  Blood Culture (routine x 2)     Status: None (Preliminary result)   Collection Time: 03/14/20 10:50 PM   Specimen: BLOOD  Result Value Ref Range Status   Specimen Description   Final    BLOOD LEFT ANTECUBITAL Performed at [place] [address], [place], [place] [address]    Special Requests   Final    BOTTLES DRAWN AEROBIC AND ANAEROBIC Blood Culture adequate volume Performed at [place] [address] [place], [place] [address]    Culture   Final    NO GROWTH 2 DAYS Performed at [place] [address] [place], [place] [address]    Report Status PENDING  Incomplete  Blood Culture (routine x 2)     Status: None (Preliminary result)   Collection Time: 03/15/20 10:02 AM   Specimen: BLOOD  Result Value Ref Range Status   Specimen Description   Final    BLOOD RIGHT ARM Performed at [place] [address], [place], [place] [address]    Special Requests   Final    BOTTLES DRAWN AEROBIC ONLY Blood Culture adequate volume Performed at [place] [address] [place], [place] [address]    Culture   Final    NO GROWTH 2 DAYS Performed at [place] [address], [place], [place] [address]    Report Status PENDING  Incomplete      Radiology Studies: No results found.      Scheduled Meds: . amoxicillin-clavulanate  1 tablet Oral Q12H  . bictegravir-emtricitabine-tenofovir AF  1 tablet Oral Daily  . folic acid  1 mg Oral Daily  . lactulose  10 g Oral Daily  . LORazepam  0-4 mg Intravenous Q12H  . nicotine  14 mg Transdermal Daily  . pantoprazole  40 mg Oral q morning - 10a  . sodium chloride flush  3 mL Intravenous Q12H  . thiamine  100 mg Oral Daily  . topiramate  100 mg Oral QHS  . valACYclovir  1,000 mg Oral Daily   Continuous Infusions:    Assessment/Plan:  1.Sepsis secondary to pubic cellulitis/abscess, UTI-Patient met sepsis criteria at the time of admission with leukocytosis, lactic acidosis, hypotension, UA positive for UTI, CT with developing abscess involving pubic area and groin. Off IV fluids now in concern for pulm congestion. Admitted with IV abx- vancomycin, cefepime and  Flagyl. Patient developed hematuria on 6/6 night, had pyuria on admission --urology recommended treating as hemorrhagic cystitis. No concern for fistula on CT . Bld cx -ve. Urine cx results pending.  Afebrile last 24 hrs.  Seen by ID who recommended to resume HIV regimen and changed to PO Augmentin now.  Still has indurated pubic area --watch for recurrence of drainage/fluctuance  2. Hyperbilirubinemia/transaminitis-Related to alcoholic/autoimmune hepatitis vs cholestasis (med or infection  induced). Alk phos 187, AST 442, ALT 116, total bilirubin 11.2 at the time of admission.Ammonia level normal. History of alcohol dependence,  discriminant function score 31. Hepatitis panel negative, Hepatomegaly with steatosis on CT/USG.  [facility] consulted, (sees Dr. Alessandra Bevels outpatient, ?  Diagnosis of autoimmune hepatitis, PBC per bx at [facility])-CT/ ultrasound negative for acute cholecystitis.  Layering sludge in the gallbladder without stones, no biliary dilatation on ultrasound. Seen by GI and not a candidate for liver transplant. Bx results from [facility] indicate hepatic stetosis. LFTs now downtrending  3. Hypervolemic Hyponatremia-Presented with sodium of 127, received 3.5 L saline in ED, got congested -off IV fluids/ received lasix. Sodium improving to 133 now  4. Acute metabolic encephalopathy-in the setting of #1 and #2.  Ammonia level normal.  CT head negative-improved today after holding sedatives, low dose lactulose (ordered for constipation).   5. Hematuria : could be related to hemorrhagic cystitis.  Consulted urology, continue monitoring.  Continue empiric antibiotics until final urine culture results available.seen by ID  6. Dyspnea/ Pulm edema due to volume overload/hypoalbuminemia:  Patient received 3.5 L fluid boluses in the ED.  Chest x-ray showed cardiomegaly and pulmonary vascular congestion.DCed IV fluids, Lasix 40 mg IV x1 on 6/6-2D echo 1/21 had shown EF of 60 to 65%, may have underlying  diastolic CHF. Placed on duo nebs, nicotine patch given history of tobacco use, may have underlying COPD. Resolved and Saturating well on RA now  7. Acute blood loss superimposed on AOCD: Hemoglobin 8.3 on admisison--down to 7.2 yesterday and 7.0 today- likely related to hematuria overnight, FOBT negative on admission but nurse reports noting mild hematochezia in stools today (sounds hemorrhoidal) Fe 65, TIBC 126  anemia panel, Ferritin 1292 indicating AOCD.   8. Hypokalemia-worsened after lasix use. Replace aggressively IV/PO due to #11.Mag >2.0  9. Polysubstance abuse: He has been smoking about 1.00 pack per day. He has never used smokeless tobacco. He reports current alcohol use of about 14.0 standard drinks of alcohol per week. He reports that he does not use drugs.States he drinks 2 pack of beer daily, currently not in any withdrawals Continue thiamine, folate. D/C CIWA as not required much in last 24 hrs  10. History of HIV-CD4 790, viral load undetectable in 10/2019. Biktarvy unlikely to cause elevation in liver enzymes or hyponatremia per ID, now resumed.    11. Prolonged QT interval -QTC 515 on admission, continue telemetry, minimize QT prolonging meds Mg2.3, replace potassium aggressively. Repeat EKG  12. Schizoaffective disorder-Hold potentially sedating medications  13. Obesity-Estimated body mass index is 34.75 kg/m.    DVT prophylaxis: hold chemoprophylaxis due to anemia/hematuria Code Status: Full Family / Patient Communication: d/w patient Disposition Plan:   Status is: Inpatient  Remains inpatient appropriate because:IV treatments appropriate due to intensity of illness or inability to take PO  IV potassium , PRBC transfusion   Dispo: The patient is from: Home              Anticipated d/c is to: Home              Anticipated d/c date is: possibly in am if electrolytes replaced and doing well off CIWA, tolerating po abx              Patient currently is not  medically stable to d/c.        LOS: 3 days    Time spent: 35 minutes    Guilford Shi, MD [facility] Pager in [place]  If  7PM-7AM, please contact night-coverage [email or web address] 03/17/2020, 5:22 PM

## 2020-03-18 LAB — EPSTEIN-BARR VIRUS (EBV) ANTIBODY PROFILE
EBV NA IgG: 329 U/mL — ABNORMAL HIGH (ref 0.0–17.9)
EBV VCA IgG: 70.9 U/mL — ABNORMAL HIGH (ref 0.0–17.9)
EBV VCA IgM: <36 U/mL (ref 0.0–35.9)

## 2020-03-18 LAB — CBC WITH DIFFERENTIAL/PLATELET
Abs Immature Granulocytes: 0.86 10*3/uL — ABNORMAL HIGH (ref 0.00–0.07)
Basophils Absolute: 0.1 10*3/uL (ref 0.0–0.1)
Basophils Relative: 1 %
Eosinophils Absolute: 0.2 10*3/uL (ref 0.0–0.5)
Eosinophils Relative: 2 %
HCT: 25.2 % — ABNORMAL LOW (ref 39.0–52.0)
Hemoglobin: 8.1 g/dL — ABNORMAL LOW (ref 13.0–17.0)
Immature Granulocytes: 6 %
Lymphocytes Relative: 14 %
Lymphs Abs: 2 10*3/uL (ref 0.7–4.0)
MCH: 34.6 pg — ABNORMAL HIGH (ref 26.0–34.0)
MCHC: 32.1 g/dL (ref 30.0–36.0)
MCV: 107.7 fL — ABNORMAL HIGH (ref 80.0–100.0)
Monocytes Absolute: 1.9 10*3/uL — ABNORMAL HIGH (ref 0.1–1.0)
Monocytes Relative: 14 %
Neutro Abs: 9 10*3/uL — ABNORMAL HIGH (ref 1.7–7.7)
Neutrophils Relative %: 63 %
Platelets: 161 10*3/uL (ref 150–400)
RBC: 2.34 MIL/uL — ABNORMAL LOW (ref 4.22–5.81)
RDW: 21.2 % — ABNORMAL HIGH (ref 11.5–15.5)
WBC: 14 10*3/uL — ABNORMAL HIGH (ref 4.0–10.5)
nRBC: 0.1 % (ref 0.0–0.2)

## 2020-03-18 LAB — COMPREHENSIVE METABOLIC PANEL
ALT: 71 U/L — ABNORMAL HIGH (ref 0–44)
AST: 161 U/L — ABNORMAL HIGH (ref 15–41)
Albumin: 2.1 g/dL — ABNORMAL LOW (ref 3.5–5.0)
Alkaline Phosphatase: 138 U/L — ABNORMAL HIGH (ref 38–126)
Anion gap: 10 (ref 5–15)
BUN: 9 mg/dL (ref 6–20)
CO2: 23 mmol/L (ref 22–32)
Calcium: 9 mg/dL (ref 8.9–10.3)
Chloride: 102 mmol/L (ref 98–111)
Creatinine, Ser: 0.91 mg/dL (ref 0.61–1.24)
GFR calc Af Amer: >60 mL/min (ref >60)
GFR calc non Af Amer: >60 mL/min (ref >60)
Glucose, Bld: 106 mg/dL — ABNORMAL HIGH (ref 70–99)
Potassium: 2.9 mmol/L — ABNORMAL LOW (ref 3.5–5.1)
Sodium: 135 mmol/L (ref 135–145)
Total Bilirubin: 9.8 mg/dL — ABNORMAL HIGH (ref 0.3–1.2)
Total Protein: 7.1 g/dL (ref 6.5–8.1)

## 2020-03-18 LAB — TYPE AND SCREEN
ABO/RH(D): O POS
Antibody Screen: NEGATIVE
Unit division: 0

## 2020-03-18 LAB — T.PALLIDUM AB, TOTAL: T Pallidum Abs: REACTIVE — AB

## 2020-03-18 LAB — PROTIME-INR
INR: 1.3 — ABNORMAL HIGH (ref 0.8–1.2)
Prothrombin Time: 15.6 seconds — ABNORMAL HIGH (ref 11.4–15.2)

## 2020-03-18 LAB — CMV IGM: CMV IgM: <30 AU/mL (ref 0.0–29.9)

## 2020-03-18 LAB — BPAM RBC
Blood Product Expiration Date: 202107092359
ISSUE DATE / TIME: 202106082119
Unit Type and Rh: 5100

## 2020-03-18 LAB — URINE CULTURE: Culture: >=100000 — AB

## 2020-03-18 LAB — RPR
RPR Ser Ql: REACTIVE — AB
RPR Titer: 1:2 {titer}

## 2020-03-18 LAB — CMV ANTIBODY, IGG (EIA): CMV Ab - IgG: >10 U/mL — ABNORMAL HIGH (ref 0.00–0.59)

## 2020-03-18 MED ORDER — POTASSIUM CHLORIDE 20 MEQ PO PACK
40.0000 meq | PACK | ORAL | Status: AC
  Administered 2020-03-18 (×2): 40 meq via ORAL
  Filled 2020-03-18 (×2): qty 2

## 2020-03-18 MED ORDER — LACTULOSE 10 GM/15ML PO SOLN
10.0000 g | Freq: Two times a day (BID) | ORAL | Status: DC
  Administered 2020-03-18 – 2020-03-20 (×4): 10 g via ORAL
  Filled 2020-03-18 (×4): qty 30

## 2020-03-18 NOTE — Progress Notes (Signed)
Subjective: [name] is without urinary complaints this morning and his urine is clearing on current therapy.  ROS:  Review of Systems  All other systems reviewed and are negative.   Anti-infectives: Anti-infectives (From admission, onward)   Start     Dose/Rate Route Frequency Ordered Stop   03/17/20 1800  bictegravir-emtricitabine-tenofovir AF (BIKTARVY) 50-200-25 MG per tablet 1 tablet     1 tablet Oral Daily 03/17/20 0808     03/17/20 1400  amoxicillin-clavulanate (AUGMENTIN) 875-125 MG per tablet 1 tablet     1 tablet Oral Every 12 hours 03/17/20 0812     03/16/20 1600  metroNIDAZOLE (FLAGYL) IVPB 500 mg  Status:  Discontinued     500 mg 100 mL/hr over 60 Minutes Intravenous Every 8 hours 03/16/20 1422 03/17/20 0812   03/15/20 1000  valACYclovir (VALTREX) tablet 1,000 mg     1,000 mg Oral Daily 03/15/20 0606     03/15/20 0800  vancomycin (VANCOCIN) IVPB 1000 mg/200 mL premix  Status:  Discontinued     1,000 mg 200 mL/hr over 60 Minutes Intravenous Every 8 hours 03/15/20 0608 03/17/20 1138   03/15/20 0615  vancomycin (VANCOCIN) IVPB 1000 mg/200 mL premix  Status:  Discontinued     1,000 mg 200 mL/hr over 60 Minutes Intravenous  Once 03/15/20 0606 03/15/20 0608   03/15/20 0615  ceFEPIme (MAXIPIME) 2 g in sodium chloride 0.9 % 100 mL IVPB  Status:  Discontinued     2 g 200 mL/hr over 30 Minutes Intravenous Every 8 hours 03/15/20 0602 03/17/20 0812   03/14/20 2245  vancomycin (VANCOREADY) IVPB 2000 mg/400 mL     2,000 mg 200 mL/hr over 120 Minutes Intravenous  Once 03/14/20 2236 03/15/20 0138   03/14/20 2230  ceFEPIme (MAXIPIME) 2 g in sodium chloride 0.9 % 100 mL IVPB     2 g 200 mL/hr over 30 Minutes Intravenous  Once 03/14/20 2227 03/14/20 2326   03/14/20 2230  metroNIDAZOLE (FLAGYL) IVPB 500 mg     500 mg 100 mL/hr over 60 Minutes Intravenous  Once 03/14/20 2227 03/15/20 0036   03/14/20 2230  vancomycin (VANCOCIN) IVPB 1000 mg/200 mL premix  Status:  Discontinued      1,000 mg 200 mL/hr over 60 Minutes Intravenous  Once 03/14/20 2227 03/14/20 2236      Current Facility-Administered Medications  Medication Dose Route Frequency Provider Last Rate Last Admin  . 0.9 %  sodium chloride infusion (Manually program via Guardrails IV Fluids)   Intravenous Once Guilford Shi, MD      . amoxicillin-clavulanate (AUGMENTIN) 875-125 MG per tablet 1 tablet  1 tablet Oral Q12H Tommy Medal, Lavell Islam, MD   1 tablet at 03/17/20 2153  . bictegravir-emtricitabine-tenofovir AF (BIKTARVY) 50-200-25 MG per tablet 1 tablet  1 tablet Oral Daily Tommy Medal, Lavell Islam, MD   1 tablet at 03/17/20 1721  . folic acid (FOLVITE) tablet 1 mg  1 mg Oral Daily Opyd, Ilene Qua, MD   1 mg at 03/17/20 0806  . guaiFENesin-dextromethorphan (ROBITUSSIN DM) 100-10 MG/5ML syrup 5 mL  5 mL Oral Q4H PRN Guilford Shi, MD   5 mL at 03/18/20 0656  . ipratropium-albuterol (DUONEB) 0.5-2.5 (3) MG/3ML nebulizer solution 3 mL  3 mL Nebulization Q4H PRN Rai, Ripudeep K, MD   3 mL at 03/17/20 2244  . lactulose (CHRONULAC) 10 GM/15ML solution 10 g  10 g Oral Daily Guilford Shi, MD   10 g at 03/17/20 0817  . nicotine (NICODERM CQ -  dosed in mg/24 hours) patch 14 mg  14 mg Transdermal Daily Rai, Ripudeep K, MD   14 mg at 03/17/20 0806  . pantoprazole (PROTONIX) EC tablet 40 mg  40 mg Oral q morning - 10a Opyd, Ilene Qua, MD   40 mg at 03/17/20 0806  . sodium chloride flush (NS) 0.9 % injection 3 mL  3 mL Intravenous Q12H Opyd, Ilene Qua, MD   3 mL at 03/17/20 0820  . thiamine tablet 100 mg  100 mg Oral Daily Opyd, Ilene Qua, MD   100 mg at 03/17/20 0805  . topiramate (TOPAMAX) tablet 100 mg  100 mg Oral QHS Opyd, Ilene Qua, MD   100 mg at 03/17/20 2153  . valACYclovir (VALTREX) tablet 1,000 mg  1,000 mg Oral Daily Opyd, Ilene Qua, MD   1,000 mg at 03/17/20 0805     Objective: Vital signs in last 24 hours: Temp:  [98 F (36.7 C)-99.3 F (37.4 C)] 99.3 F (37.4 C) (06/09 0646) Pulse Rate:  [90-100]  90 (06/09 0646) Resp:  [18-24] 20 (06/09 0646) BP: (102-117)/(52-86) 117/64 (06/09 0646) SpO2:  [92 %-97 %] 94 % (06/09 0646) Weight:  [126.5 kg] 126.5 kg (06/09 0500)  Intake/Output from previous day: 06/08 0701 - 06/09 0700 In: 1117.8 [P.O.:120; I.V.:50; Blood:350; IV Piggyback:597.8] Out: 2075 [Urine:2075] Intake/Output this shift: No intake/output data recorded.   Physical Exam Vitals reviewed.  Constitutional:      Appearance: Normal appearance.  Genitourinary:    Comments: Urine clear in catheter tube.  Neurological:     Mental Status: He is alert.     Lab Results:  Recent Labs    03/17/20 0527 03/18/20 0444  WBC 12.1* 14.0*  HGB 7.0* 8.1*  HCT 22.2* 25.2*  PLT 134* 161   BMET Recent Labs    03/17/20 0527 03/18/20 0444  NA 133* 135  K 2.9* 2.9*  CL 99 102  CO2 23 23  GLUCOSE 107* 106*  BUN 8 9  CREATININE 0.87 0.91  CALCIUM 8.6* 9.0   PT/INR Recent Labs    03/17/20 0527 03/18/20 0444  LABPROT 16.3* 15.6*  INR 1.4* 1.3*   ABG No results for input(s): PHART, HCO3 in the last 72 hours.  Invalid input(s): PCO2, PO2  Studies/Results: No results found.   Assessment and Plan: Hemorrhagic cystitis.   Urine is clearing on current therapy.   He will need office f/u in 2-3 weeks.      LOS: 4 days    [name] 03/18/2020 [phone] ID: Esmeralda Arthur, male   DOB: 1986/02/13, 34 y.o.   MRN: [id number]

## 2020-03-18 NOTE — Progress Notes (Signed)
[name] 8:44 AM  Subjective: Patient without any new complaints and case discussed with the patient's nurse and specifically he is eating fine no abdominal pain no signs of bleeding no bowel movement problems  Objective: Vital signs stable afebrile no acute distress patient not examined today CBC stable LFTs about the same  Assessment: Increased LFTs probably multifactorial  Plan: It may take time for his liver test to get back to normal and he does have a liver specialist as an outpatient and please call us back if we could be of any further inpatient help and I discussed rehab physical therapy with the nurse and on a personal note I am not sure patient is capable of living by himself and taking care of himself  [facility]  office [phone] After 5PM or if no answer call [phone]

## 2020-03-18 NOTE — Plan of Care (Signed)
  Problem: Education: Goal: Knowledge of General Education information will improve Description Including pain rating scale, medication(s)/side effects and non-pharmacologic comfort measures Outcome: Progressing   Problem: Health Behavior/Discharge Planning: Goal: Ability to manage health-related needs will improve Outcome: Progressing   

## 2020-03-18 NOTE — Progress Notes (Signed)
PROGRESS NOTE  [name]  MRN:[id number] DOB: 10/24/1985 DOA: 03/14/2020 PCP: Avbuere, Edwin, MD   Brief Narrative: Admit date:03/14/2020 34-year-old male with history of HIV, seizures, schizoaffective disorder, alcohol dependence who was admitted with Covid pneumonia in 10/2019 presented to ED now with lethargy, dysuria and confusion. BP 90/60 with EMS that improved in ED. ED Course: Afebrile,saturating low 90s on room air, slightly tachycardic, and with stable blood pressure. sodium 127, potassium 3.1, alkaline phosphatase 187, albumin 2.3, AST "42, ALT 60, and total bilirubin 11.2. CBC features a new leukocytosis to 12,900, worsened chronic anemia with hemoglobin of 8.4, and thrombocytopenia with platelets 111,000. Fecal occult blood testing is negative, acetaminophen level undetectable, COVID-19 screening test also negative. Lactic acid was elevated to 2.5, then down to 1.6 with fluids. INR is 1.4.  CXR unremarkable. UA positive for UTI.CT head negative for acute intracranial abnormality. CT abdomen pelvis showed cellulitis, possibly developing abscess in the pubic area, bloating, hepatomegaly, cholelithiasis without evidence of acute cholecystitis. Cystitis with ascending UTI, no biliary dilatation or focal lesions.RUQ ultrasound reported layering sludge in the gallbladder without acute cholecystitis or biliary dilatation Hospital course: Patient received Vanc/cefepime/flagyl and admitted to [facility] for further evaluation and management of sepsis, cystitis, hepatic failure and metabolic encephalopathy.  Assessment & Plan: Principal Problem:   Sepsis secondary to UTI (HCC) Active Problems:   Schizoaffective disorder, depressive type (HCC)   Severe alcohol dependence (HCC)   Hypokalemia   Cellulitis of groin   Elevated LFTs   HIV disease (HCC)   Acute encephalopathy   Hyponatremia   Macrocytic anemia   Thrombocytopenia (HCC)   Prolonged QT interval  Acute metabolic encephalopathy:   - Increase lactulose, check ammonia in AM, has some asterixis.  - Avoid sedating medications  Sepsis secondary to pubic cellulitis/abscess, UTI/hemorrhagic cystitis: Patient met sepsis criteria at the time of admission with leukocytosis, lactic acidosis, hypotension, UA positive for UTI, CT with developing abscess involving pubic area and groin.  - Narrowed vanc/cefepime/flagyl to augmentin per ID recommendations to cover UTI (alpha-hemolytic GPC ?aerococcus, susceptibilities pending) and SSTI. - Urology recommended treating as hemorrhagic cystitis. No concern for fistula on CT .  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 LFTs improving slowly. Discriminant function 31. Hepatitis panel negative, Hepatomegaly with steatosis on CT/US.  - [facility] consulted, no further recommendations, anticipate continued improvement. - Seen by GI and not a candidate for liver transplant. Bx results from [facility] indicate hepatic steatosis. LFTs now downtrending  Hypervolemic hyponatremia: Presented with sodium of 127, received 3.5 L saline in ED, got congested -off IV fluids/ received lasix. Resolved.   Hemorrhagic cystitis:  - Consulted urology, continue monitoring.  Continue empiric antibiotics until final urine culture results available   Dyspnea/acute pulmonary edema due to volume overload/hypoalbuminemia: Patient received 3.5 L fluid boluses in the ED. Chest x-ray showed cardiomegaly and pulmonary vascular congestion. DCed IV fluids, Lasix 40 mg IV x1 on 6/6-2D echo 1/21 had shown EF of 60 to 65%, may have underlying diastolic CHF. Placed on duo nebs, nicotine patch given history of tobacco use, may have underlying COPD.  - Resolved and Saturating well on RA now  Acute blood loss superimposed on AOCD:  - s/p 1u PRBCs 6/8.  Hypokalemia:  - Continue to aggressive.   Polysubstance abuse,  alcohol abuse, tobacco use:  - States he will not be drinking any more.  - Continue thiamine, folate.  - D/C CIWA as he's not got   evidence of withdrawal  HIV disease: CD4 790, viral load undetectable in 10/2019.  - Continue ART, unlikely to be cause of LFT abnormalities.   Prolonged QT interval-QTC 515 on admission, continue telemetry, minimize QT prolonging meds - Continue to supplement K and Mg prn as above.   Schizoaffective disorder: No current psychotic features or dysthymia.  - Holding sedating medications.   Obesity: Body mass index is 34.86 kg/m.   RPR weakly reactive (1:2) will discuss w/ID.  DVT prophylaxis: SCDs (not pharmacologic DVT Ppx due to anemia, hematuria) Code Status: Full Family Communication: None at bedside Disposition Plan:  Status is: Inpatient  Remains inpatient appropriate because: Persistent altered mental status   Dispo: The patient is from: Home              Anticipated d/c is to: TBD, mentation not clear enough to return home              Anticipated d/c date is: 2 days              Patient currently is not medically stable to d/c.  Consultants:   GI  ID  Urology  Antimicrobials:  Vancomycin 6/5 - 6/8  Flagyl 6/5 - 6/8  Cefepime 6/5 - 6/7  Augmentin 6/8 >>    Subjective: Oriented to person, place, month and year, not day/date. Falls asleep while speaking. He is taking lactulose when encouraged to do so.  Objective: Vitals:   03/18/20 0101 03/18/20 0500 03/18/20 0646 03/18/20 1424  BP: 115/67  117/64 (!) 107/55  Pulse: 97  90 92  Resp: (!) 22  20 (!) 22  Temp: 98 F (36.7 C)  99.3 F (37.4 C) 98.4 F (36.9 C)  TempSrc: Oral  Oral Oral  SpO2: 95%  94% 100%  Weight:  126.5 kg    Height:        Intake/Output Summary (Last 24 hours) at 03/18/2020 1821 Last data filed at 03/18/2020 1605 Gross per 24 hour  Intake 797.8 ml  Output 1750 ml  Net -952.2 ml   Filed Weights   03/15/20 1700 03/16/20 0643 03/18/20 0500   Weight: 126 kg 123.9 kg 126.5 kg    Gen: 34 y.o. male in no distress Pulm: Non-labored breathing room air. Clear to auscultation bilaterally.  CV: Regular rate and rhythm. No murmur, rub, or gallop. No JVD, no pitting pedal edema. GI: Abdomen soft, non-tender, non-distended, with normoactive bowel sounds. No organomegaly or masses felt. Ext: Warm, no deformities Skin: Diffuse jaundice, no new rashes, lesions or ulcers. No exudate from wound. Neuro: Alert and incompletely oriented. Slightly asterixis. No focal neurological deficits. Psych: Judgement and insight appear impaired. Mood & affect appropriate.   Data Reviewed: I have personally reviewed following labs and imaging studies  CBC: Recent Labs  Lab 03/14/20 1947 03/15/20 1002 03/16/20 0446 03/17/20 0527 03/18/20 0444  WBC 12.9* 12.3* 10.1 12.1* 14.0*  NEUTROABS 10.3*  --  7.5 8.5* 9.0*  HGB 8.4* 8.3* 7.2* 7.0* 8.1*  HCT 26.3* 25.9* 22.5* 22.2* 25.2*  MCV 106.9* 107.9* 105.1* 109.4* 107.7*  PLT 111* 121* 124* 134* 161   Basic Metabolic Panel: Recent Labs  Lab 03/14/20 1947 03/14/20 1947 03/15/20 1002 03/16/20 0446 03/16/20 1816 03/17/20 0527 03/18/20 0444  NA 127*  --  130* 128*  --  133* 135  K 3.1*   < > 3.0* 2.6* 2.9* 2.9* 2.9*  CL 89*  --  94* 93*  --  99 102  CO2 22  --    22 24  --  23 23  GLUCOSE 92  --  93 124*  --  107* 106*  BUN 10  --  10 10  --  8 9  CREATININE 0.94  --  0.85 0.90  --  0.87 0.91  CALCIUM 8.8*  --  8.6* 8.2*  --  8.6* 9.0  MG  --   --  2.3  --   --   --   --   PHOS  --   --  2.1*  --   --   --   --    < > = values in this interval not displayed.   GFR: Estimated Creatinine Clearance: 160.8 mL/min (by C-G formula based on SCr of 0.91 mg/dL). Liver Function Tests: Recent Labs  Lab 03/14/20 1947 03/15/20 1002 03/16/20 0446 03/17/20 0527 03/18/20 0444  AST 442* 449* 315* 202* 161*  ALT 116* 116* 103* 82* 71*  ALKPHOS 187* 178* 158* 142* 138*  BILITOT 11.2* 11.9* 10.1* 9.5*  9.8*  PROT 7.8 7.5 6.9 6.8 7.1  ALBUMIN 2.3* 2.3* 2.0* 2.0* 2.1*   Recent Labs  Lab 03/14/20 1947  LIPASE 60*   Recent Labs  Lab 03/14/20 1947  AMMONIA 29   Coagulation Profile: Recent Labs  Lab 03/14/20 2233 03/16/20 0446 03/17/20 0527 03/18/20 0444  INR 1.4* 1.3* 1.4* 1.3*   Cardiac Enzymes: No results for input(s): CKTOTAL, CKMB, CKMBINDEX, TROPONINI in the last 168 hours. BNP (last 3 results) No results for input(s): PROBNP in the last 8760 hours. HbA1C: No results for input(s): HGBA1C in the last 72 hours. CBG: No results for input(s): GLUCAP in the last 168 hours. Lipid Profile: No results for input(s): CHOL, HDL, LDLCALC, TRIG, CHOLHDL, LDLDIRECT in the last 72 hours. Thyroid Function Tests: No results for input(s): TSH, T4TOTAL, FREET4, T3FREE, THYROIDAB in the last 72 hours. Anemia Panel: No results for input(s): VITAMINB12, FOLATE, FERRITIN, TIBC, IRON, RETICCTPCT in the last 72 hours. Urine analysis:    Component Value Date/Time   COLORURINE AMBER (A) 03/14/2020 1947   APPEARANCEUR CLOUDY (A) 03/14/2020 1947   LABSPEC 1.011 03/14/2020 1947   PHURINE 6.0 03/14/2020 1947   GLUCOSEU NEGATIVE 03/14/2020 1947   HGBUR LARGE (A) 03/14/2020 1947   BILIRUBINUR MODERATE (A) 03/14/2020 1947   KETONESUR 20 (A) 03/14/2020 1947   PROTEINUR 100 (A) 03/14/2020 1947   UROBILINOGEN 0.2 12/21/2014 1006   NITRITE NEGATIVE 03/14/2020 1947   LEUKOCYTESUR MODERATE (A) 03/14/2020 1947   Recent Results (from the past 240 hour(s))  SARS Coronavirus 2 by RT PCR (hospital order, performed in [place] hospital lab) Nasopharyngeal Nasopharyngeal Swab     Status: None   Collection Time: 03/14/20  9:02 PM   Specimen: Nasopharyngeal Swab  Result Value Ref Range Status   SARS Coronavirus 2 NEGATIVE NEGATIVE Final    Comment: (NOTE) SARS-CoV-2 target nucleic acids are NOT DETECTED. The SARS-CoV-2 RNA is generally detectable in upper and lower respiratory specimens during the  acute phase of infection. The lowest concentration of SARS-CoV-2 viral copies this assay can detect is 250 copies / mL. A negative result does not preclude SARS-CoV-2 infection and should not be used as the sole basis for treatment or other patient management decisions.  A negative result may occur with improper specimen collection / handling, submission of specimen other than nasopharyngeal swab, presence of viral mutation(s) within the areas targeted by this assay, and inadequate number of viral copies (<250 copies / mL). A negative result must be  combined with clinical observations, patient history, and epidemiological information. Fact Sheet for Patients:   https://www.fda.gov/media/136312/download Fact Sheet for Healthcare Providers: https://www.fda.gov/media/136313/download This test is not yet approved or cleared  by the [place] FDA and has been authorized for detection and/or diagnosis of SARS-CoV-2 by FDA under an Emergency Use Authorization (EUA).  This EUA will remain in effect (meaning this test can be used) for the duration of the COVID-19 declaration under Section 564(b)(1) of the Act, 21 U.S.C. section 360bbb-3(b)(1), unless the authorization is terminated or revoked sooner. Performed at [facility], [address] [place], [place] [address]   Urine culture     Status: Abnormal   Collection Time: 03/14/20 10:31 PM   Specimen: In/Out Cath Urine  Result Value Ref Range Status   Specimen Description   Final    IN/OUT CATH URINE Performed at [facility], [address] [place], [place] [address]    Special Requests   Final    NONE Performed at [facility], [address] [place], [place] [address]    Culture (A)  Final    >=100,000 COLONIES/mL ALPHA HEMOLYTIC GRAM POSITIVE COCCI POSSIBLE AEROCOCCUS SPECIES Performed at [facility], [address] [place], [place] [address]    Report Status  03/18/2020 FINAL  Final  Blood Culture (routine x 2)     Status: None (Preliminary result)   Collection Time: 03/14/20 10:50 PM   Specimen: BLOOD  Result Value Ref Range Status   Specimen Description   Final    BLOOD LEFT ANTECUBITAL Performed at [facility], [address] [place], [place] [address]    Special Requests   Final    BOTTLES DRAWN AEROBIC AND ANAEROBIC Blood Culture adequate volume Performed at [facility], [address] [place], [place] [address]    Culture   Final    NO GROWTH 3 DAYS Performed at [facility], [address] [place], [place] [address]    Report Status PENDING  Incomplete  Blood Culture (routine x 2)     Status: None (Preliminary result)   Collection Time: 03/15/20 10:02 AM   Specimen: BLOOD  Result Value Ref Range Status   Specimen Description   Final    BLOOD RIGHT ARM Performed at [facility], [address] , [place] [address]    Special Requests   Final    BOTTLES DRAWN AEROBIC ONLY Blood Culture adequate volume Performed at [facility], [address] , [place] [address]    Culture   Final    NO GROWTH 3 DAYS Performed at [facility], [address] , [place] [address]    Report Status PENDING  Incomplete      Radiology Studies: No results found.  Scheduled Meds: . sodium chloride   Intravenous Once  . amoxicillin-clavulanate  1 tablet Oral Q12H  . bictegravir-emtricitabine-tenofovir AF  1 tablet Oral Daily  . folic acid  1 mg Oral Daily  . lactulose  10 g Oral Daily  . nicotine  14 mg Transdermal Daily  . pantoprazole  40 mg Oral q morning - 10a  . sodium chloride flush  3 mL Intravenous Q12H  . thiamine  100 mg Oral Daily  . topiramate  100 mg Oral QHS  . valACYclovir  1,000 mg Oral Daily   Continuous Infusions:   LOS: 4 days   Time spent: 25 minutes.    Patrecia Pour, MD [facility] [email or web address] 03/18/2020, 6:21 PM

## 2020-03-18 NOTE — Progress Notes (Signed)
Patient has a full pack of [employer] brand cigarett and one lighter locked in [place] cabinet.

## 2020-03-19 LAB — COMPREHENSIVE METABOLIC PANEL
ALT: 66 U/L — ABNORMAL HIGH (ref 0–44)
AST: 137 U/L — ABNORMAL HIGH (ref 15–41)
Albumin: 2 g/dL — ABNORMAL LOW (ref 3.5–5.0)
Alkaline Phosphatase: 131 U/L — ABNORMAL HIGH (ref 38–126)
Anion gap: 5 (ref 5–15)
BUN: 8 mg/dL (ref 6–20)
CO2: 25 mmol/L (ref 22–32)
Calcium: 9.2 mg/dL (ref 8.9–10.3)
Chloride: 105 mmol/L (ref 98–111)
Creatinine, Ser: 0.79 mg/dL (ref 0.61–1.24)
GFR calc Af Amer: >60 mL/min (ref >60)
GFR calc non Af Amer: >60 mL/min (ref >60)
Glucose, Bld: 106 mg/dL — ABNORMAL HIGH (ref 70–99)
Potassium: 3.3 mmol/L — ABNORMAL LOW (ref 3.5–5.1)
Sodium: 135 mmol/L (ref 135–145)
Total Bilirubin: 8.5 mg/dL — ABNORMAL HIGH (ref 0.3–1.2)
Total Protein: 7.1 g/dL (ref 6.5–8.1)

## 2020-03-19 LAB — CBC WITH DIFFERENTIAL/PLATELET
Abs Immature Granulocytes: 1.05 10*3/uL — ABNORMAL HIGH (ref 0.00–0.07)
Basophils Absolute: 0.1 10*3/uL (ref 0.0–0.1)
Basophils Relative: 1 %
Eosinophils Absolute: 0.2 10*3/uL (ref 0.0–0.5)
Eosinophils Relative: 2 %
HCT: 27.1 % — ABNORMAL LOW (ref 39.0–52.0)
Hemoglobin: 8.3 g/dL — ABNORMAL LOW (ref 13.0–17.0)
Immature Granulocytes: 8 %
Lymphocytes Relative: 15 %
Lymphs Abs: 1.9 10*3/uL (ref 0.7–4.0)
MCH: 34.3 pg — ABNORMAL HIGH (ref 26.0–34.0)
MCHC: 30.6 g/dL (ref 30.0–36.0)
MCV: 112 fL — ABNORMAL HIGH (ref 80.0–100.0)
Monocytes Absolute: 1.5 10*3/uL — ABNORMAL HIGH (ref 0.1–1.0)
Monocytes Relative: 12 %
Neutro Abs: 7.6 10*3/uL (ref 1.7–7.7)
Neutrophils Relative %: 62 %
Platelets: 164 10*3/uL (ref 150–400)
RBC: 2.42 MIL/uL — ABNORMAL LOW (ref 4.22–5.81)
RDW: 20.9 % — ABNORMAL HIGH (ref 11.5–15.5)
WBC: 12.4 10*3/uL — ABNORMAL HIGH (ref 4.0–10.5)
nRBC: 0.2 % (ref 0.0–0.2)

## 2020-03-19 LAB — AMMONIA: Ammonia: 40 umol/L — ABNORMAL HIGH (ref 9–35)

## 2020-03-19 LAB — PROTIME-INR
INR: 1.4 — ABNORMAL HIGH (ref 0.8–1.2)
Prothrombin Time: 16.3 seconds — ABNORMAL HIGH (ref 11.4–15.2)

## 2020-03-19 NOTE — Evaluation (Signed)
Occupational Therapy Evaluation Patient Details Name: [name] MRN: [id number] DOB: 11-15-1985 Today's Date: 03/19/2020    History of Present Illness 34 year old male with history of HIV, seizures, schizoaffective disorder, alcohol dependence who was admitted with Covid pneumonia in 10/2019 presented to ED now with lethargy, dysuria and confusion. Dx of sepsis, UTI, pulmonary edema 2* volume overload, metabolic encephalopathy.   Clinical Impression   [name] is a 34 year old man with multiple medical comorbidities admitted to hospital with sepsis, UT and metabolic encephalopathy. On evaluation patient demonstrates functional strength of upper extremities but overall generalized weakness, decreased endurance and impaired balance. Patient reports ever since getting COVID in January he hasn't returned to his baseline. Patient will benefit from skilled OT services to improve and maintain abilities while in hospital in order to return home safely at discharge.     Follow Up Recommendations  No OT follow up    Equipment Recommendations  None recommended by OT    Recommendations for Other Services       Precautions / Restrictions Precautions Precautions: Fall Precaution Comments: pt reports he fell just prior to admission, stated he takes meclizine for vertigo Restrictions Weight Bearing Restrictions: No      Mobility Bed Mobility Overal bed mobility: Modified Independent             General bed mobility comments: used rail  Transfers Overall transfer level: Needs assistance Equipment used: Rolling walker (2 wheeled) Transfers: Sit to/from Stand Sit to Stand: Min guard         General transfer comment: mim guard for safety, mildly unsteady which patient reports is his baseline.    Balance Overall balance assessment: Mild deficits observed, not formally tested   Sitting balance-Leahy Scale: Good       Standing balance-Leahy Scale: Fair Standing  balance comment: mildly unsteady without UE support                           ADL either performed or assessed with clinical judgement   ADL Overall ADL's : Needs assistance/impaired Eating/Feeding: Independent   Grooming: Set up;Standing Grooming Details (indicate cue type and reason): performed grooming standing at sink. Upper Body Bathing: Set up;Sitting   Lower Body Bathing: Sit to/from stand;Set up;Min guard   Upper Body Dressing : Set up;Sitting   Lower Body Dressing: Set up;Sit to/from stand;Min guard Lower Body Dressing Details (indicate cue type and reason): Patient able to donn and doff socks by bring foot over opposite x 2. Socks had to be replaced over toileting. Toilet Transfer: Min Lobbyist Details (indicate cue type and reason): Used BSC placed over toilet. Toileting- Water quality scientist and Hygiene: Min guard;Sit to/from stand;Supervision/safety Toileting - Clothing Manipulation Details (indicate cue type and reason): patient able to perform toileting - mildly unsteady. Tub/ Shower Transfer: Min guard;Shower seat   Functional mobility during ADLs: Min guard;Rolling walker       Vision   Vision Assessment?: No apparent visual deficits     Perception     Praxis      Pertinent Vitals/Pain Pain Assessment: No/denies pain     Hand Dominance Right   Extremity/Trunk Assessment Upper Extremity Assessment Upper Extremity Assessment: Overall WFL for tasks assessed   Lower Extremity Assessment Lower Extremity Assessment: Defer to PT evaluation   Cervical / Trunk Assessment Cervical / Trunk Assessment: Normal   Communication Communication Communication: No difficulties   Cognition Arousal/Alertness: Awake/alert Behavior During Therapy:  WFL for tasks assessed/performed;Flat affect Overall Cognitive Status: No family/caregiver present to determine baseline cognitive functioning                                  General Comments: pt found lying in very large, liquid BM saturating entire bed pad, pt was unaware he'd had a BM in the bed; pt alert/oriented, able to follow commands   General Comments       Exercises     Shoulder Instructions      Home Living Family/patient expects to be discharged to:: Private residence Living Arrangements: Alone Available Help at Discharge: Other (Comment) ([place] aide 7 days a week for 2 hours) Type of Home: Apartment Home Access: Stairs to enter Entrance Stairs-Number of Steps: 3 Entrance Stairs-Rails: Left;Can reach both;Right Home Layout: One level     Bathroom Shower/Tub: Teacher, early years/pre: Standard Bathroom Accessibility: No   Home Equipment: Clinical cytogeneticist - 2 wheels          Prior Functioning/Environment Level of Independence: Independent with assistive device(s)        Comments: reports using a rw since Thursday after a fall but typically doesn't use a device. Independent with ADLs.        OT Problem List: Decreased activity tolerance;Impaired balance (sitting and/or standing);Decreased safety awareness;Decreased cognition      OT Treatment/Interventions: Self-care/ADL training;Therapeutic exercise;Therapeutic activities;DME and/or AE instruction;Patient/family education;Balance training    OT Goals(Current goals can be found in the care plan section) Acute Rehab OT Goals Patient Stated Goal: to be able to walk farther OT Goal Formulation: With patient Time For Goal Achievement: 04/02/20 Potential to Achieve Goals: Good  OT Frequency: Min 2X/week   Barriers to D/C:            Co-evaluation              AM-PAC OT "6 Clicks" Daily Activity     Outcome Measure Help from another person eating meals?: None Help from another person taking care of personal grooming?: A Little Help from another person toileting, which includes using toliet, bedpan, or urinal?: A Little Help from another person bathing  (including washing, rinsing, drying)?: A Little Help from another person to put on and taking off regular upper body clothing?: A Little Help from another person to put on and taking off regular lower body clothing?: A Little 6 Click Score: 19   End of Session Equipment Utilized During Treatment: Gait belt;Rolling walker Nurse Communication: Mobility status  Activity Tolerance: Patient tolerated treatment well Patient left: in bed;with call bell/phone within reach;with bed alarm set  OT Visit Diagnosis: Unsteadiness on feet (R26.81);Muscle weakness (generalized) (M62.81)                Time: 0930-1006 OT Time Calculation (min): 36 min Charges:  OT General Charges $OT Visit: 1 Visit OT Evaluation $OT Eval Low Complexity: 1 Low OT Treatments $Self Care/Home Management : 8-22 mins  Sherlin Sonier, OTR/L [place]  Office [phone] Pager: [place] 03/19/2020, 1:08 PM

## 2020-03-19 NOTE — Progress Notes (Addendum)
PROGRESS NOTE  [name]  OVF:[id number] DOB: 1986-05-08 DOA: 03/14/2020 PCP: [name], MD   Brief Narrative: Admit date:03/14/2020 34 year old male with history of HIV, seizures, schizoaffective disorder, alcohol dependence who was admitted with Covid pneumonia in 10/2019 presented to ED now with lethargy, dysuria and confusion. BP 90/60 with EMS that improved in ED. ED Course: Afebrile,saturating low 90s on room air, slightly tachycardic, and with stable blood pressure. sodium 127, potassium 3.1, alkaline phosphatase 187, albumin 2.3, AST "42, ALT 60, and total bilirubin 11.2. CBC features a new leukocytosis to 12,900, worsened chronic anemia with hemoglobin of 8.4, and thrombocytopenia with platelets 111,000. Fecal occult blood testing is negative, acetaminophen level undetectable, COVID-19 screening test also negative. Lactic acid was elevated to 2.5, then down to 1.6 with fluids. INR is 1.4.  CXR unremarkable. UA positive for UTI.CT head negative for acute intracranial abnormality. CT abdomen pelvis showed cellulitis, possibly developing abscess in the pubic area, bloating, hepatomegaly, cholelithiasis without evidence of acute cholecystitis. Cystitis with ascending UTI, no biliary dilatation or focal lesions.RUQ ultrasound reported layering sludge in the gallbladder without acute cholecystitis or biliary dilatation Hospital course: Patient received Vanc/cefepime/flagyl and admitted to [facility] for further evaluation and management of sepsis, cystitis, hepatic failure and metabolic encephalopathy.  Assessment & Plan: Principal Problem:   Sepsis secondary to UTI [facility]) Active Problems:   Schizoaffective disorder, depressive type (HCC)   Severe alcohol dependence (HCC)   Hypokalemia   Cellulitis of groin   Elevated LFTs   HIV disease (HCC)   Acute encephalopathy   Hyponatremia   Macrocytic anemia   Thrombocytopenia (HCC)   Prolonged QT interval  Acute metabolic encephalopathy:   - Mentation improved with increased lactulose. Ammonia confirmed to be mildly elevated.  - Continue lactulose. - Avoid sedating medications  Sepsis secondary to pubic cellulitis/abscess, UTI/hemorrhagic cystitis: Patient met sepsis criteria at the time of admission with leukocytosis, lactic acidosis, hypotension, UA positive for UTI, CT with developing abscess involving pubic area and groin.  - Narrowed vanc/cefepime/flagyl to augmentin per ID recommendations to cover UTI (alpha-hemolytic GPC ?aerococcus, susceptibilities pending) and SSTI. - Urology recommended treating as hemorrhagic cystitis. No concern for fistula on CT .  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 LFTs improving slowly. Discriminant function 31. Hepatitis panel negative, Hepatomegaly with steatosis on CT/US.  - [facility] consulted, no further recommendations, anticipate continued improvement. - Seen by GI and not a candidate for liver transplant. Bx results from [facility] indicate hepatic steatosis. LFTs now downtrending  Hypervolemic hyponatremia: Presented with sodium of 127, received 3.5 L saline in ED, got congested -off IV fluids/ received lasix. Resolved.   Hemorrhagic cystitis:  - Consulted urology, continue monitoring.  Continue empiric antibiotics until final urine culture results available   Dyspnea/acute pulmonary edema due to volume overload/hypoalbuminemia: Patient received 3.5 L fluid boluses in the ED. Chest x-ray showed cardiomegaly and pulmonary vascular congestion. DCed IV fluids, Lasix 40 mg IV x1 on 6/6-2D echo 1/21 had shown EF of 60 to 65%, may have underlying diastolic CHF. Placed on duo nebs, nicotine patch given history of tobacco use, may have underlying COPD.  - Resolved and Saturating well on RA now  Acute blood loss superimposed on AOCD:  - s/p 1u PRBCs 6/8.  Hypokalemia:  -  Continue supplementation.  Polysubstance abuse, alcohol abuse, tobacco use:  - States he will not be drinking any more.  - Continue thiamine, folate.  - D/C CIWA as  he's not got evidence of withdrawal  HIV disease: CD4 790, viral load undetectable in 10/2019.  - Continue ART, unlikely to be cause of LFT abnormalities.   Prolonged QT interval-QTC 515 on admission, continue telemetry, minimize QT prolonging meds - Continue to supplement K and Mg prn as above.   Schizoaffective disorder: No current psychotic features or dysthymia.  - Holding sedating medications.   Obesity: Body mass index is 33.95 kg/m.   RPR weakly reactive (1:2) ) with positive T. pallidum Ab. Discussed with ID, Dr. Tommy Medal who recommends no current treatment.   DVT prophylaxis: SCDs (not pharmacologic DVT Ppx due to anemia, hematuria) Code Status: Full Family Communication: None at bedside Disposition Plan:  Status is: Inpatient  Remains inpatient appropriate because: Persistent altered mental status   Dispo: The patient is from: Home              Anticipated d/c is to: Home              Anticipated d/c date is: 6/11              Patient currently is not medically stable to d/c.  Consultants:   GI  ID  Urology  Antimicrobials:  Vancomycin 6/5 - 6/8  Flagyl 6/5 - 6/8  Cefepime 6/5 - 6/7  Augmentin 6/8 >>    Subjective: More alert and oriented this morning, working with PT and OT. LFTs improved. No bleeding. Feels comfortable returning home soon.  Objective: Vitals:   03/18/20 2309 03/19/20 0500 03/19/20 0539 03/19/20 1358  BP: 117/77  118/72 112/70  Pulse: 97  92 95  Resp: (!) 24   19  Temp: 98 F (36.7 C)  98.1 F (36.7 C) 98.4 F (36.9 C)  TempSrc: Oral  Oral Oral  SpO2: 99%  98% 100%  Weight:  123.2 kg    Height:        Intake/Output Summary (Last 24 hours) at 03/19/2020 1945 Last data filed at 03/19/2020 1820 Gross per 24 hour  Intake 1200 ml  Output 1575 ml  Net  -375 ml   Filed Weights   03/16/20 0643 03/18/20 0500 03/19/20 0500  Weight: 123.9 kg 126.5 kg 123.2 kg   Gen: 34 y.o. male in no distress Pulm: Nonlabored breathing room air. Clear. CV: Regular rate and rhythm. No murmur, rub, or gallop. No JVD, no dependent edema. GI: Abdomen soft, non-tender, non-distended, with normoactive bowel sounds.  Ext: Warm, no deformities Skin: No rashes, lesions or ulcers on visualized skin. Neuro: Alert and oriented. No focal neurological deficits. Psych: Judgement and insight appear fair. Mood euthymic & affect congruent. Behavior is appropriate.    Data Reviewed: I have personally reviewed following labs and imaging studies  CBC: Recent Labs  Lab 03/14/20 1947 03/14/20 1947 03/15/20 1002 03/16/20 0446 03/17/20 0527 03/18/20 0444 03/19/20 0436  WBC 12.9*   < > 12.3* 10.1 12.1* 14.0* 12.4*  NEUTROABS 10.3*  --   --  7.5 8.5* 9.0* 7.6  HGB 8.4*   < > 8.3* 7.2* 7.0* 8.1* 8.3*  HCT 26.3*   < > 25.9* 22.5* 22.2* 25.2* 27.1*  MCV 106.9*   < > 107.9* 105.1* 109.4* 107.7* 112.0*  PLT 111*   < > 121* 124* 134* 161 164   < > = values in this interval not displayed.   Basic Metabolic Panel: Recent Labs  Lab 03/15/20 1002 03/15/20 1002 03/16/20 0446 03/16/20 1816 03/17/20 0527 03/18/20 0444 03/19/20 0436  NA 130*  --  128*  --  133* 135 135  K 3.0*   < > 2.6* 2.9* 2.9* 2.9* 3.3*  CL 94*  --  93*  --  99 102 105  CO2 22  --  24  --  _0 GLUCOSE 93  --  124*  --  107* 106* 106*  BUN 10  --  10  --  _1 CREATININE 0.85  --  0.90  --  0.87 0.91 0.79  CALCIUM 8.6*  --  8.2*  --  8.6* 9.0 9.2  MG 2.3  --   --   --   --   --   --   PHOS 2.1*  --   --   --   --   --   --    < > = values in this interval not displayed.   GFR: Estimated Creatinine Clearance: 180.6 mL/min (by C-G formula based on SCr of 0.79 mg/dL). Liver Function Tests: Recent Labs  Lab 03/15/20 1002 03/16/20 0446 03/17/20 0527 03/18/20 0444 03/19/20 0436  AST 449*  315* 202* 161* 137*  ALT 116* 103* 82* 71* 66*  ALKPHOS 178* 158* 142* 138* 131*  BILITOT 11.9* 10.1* 9.5* 9.8* 8.5*  PROT 7.5 6.9 6.8 7.1 7.1  ALBUMIN 2.3* 2.0* 2.0* 2.1* 2.0*   Recent Labs  Lab 03/14/20 1947  LIPASE 60*   Recent Labs  Lab 03/14/20 1947 03/19/20 0436  AMMONIA 29 40*   Coagulation Profile: Recent Labs  Lab 03/14/20 2233 03/16/20 0446 03/17/20 0527 03/18/20 0444 03/19/20 0436  INR 1.4* 1.3* 1.4* 1.3* 1.4*   Cardiac Enzymes: No results for input(s): CKTOTAL, CKMB, CKMBINDEX, TROPONINI in the last 168 hours. BNP (last 3 results) No results for input(s): PROBNP in the last 8760 hours. HbA1C: No results for input(s): HGBA1C in the last 72 hours. CBG: No results for input(s): GLUCAP in the last 168 hours. Lipid Profile: No results for input(s): CHOL, HDL, LDLCALC, TRIG, CHOLHDL, LDLDIRECT in the last 72 hours. Thyroid Function Tests: No results for input(s): TSH, T4TOTAL, FREET4, T3FREE, THYROIDAB in the last 72 hours. Anemia Panel: No results for input(s): VITAMINB12, FOLATE, FERRITIN, TIBC, IRON, RETICCTPCT in the last 72 hours. Urine analysis:    Component Value Date/Time   COLORURINE AMBER (A) 03/14/2020 1947   APPEARANCEUR CLOUDY (A) 03/14/2020 1947   LABSPEC 1.011 03/14/2020 1947   PHURINE 6.0 03/14/2020 1947   GLUCOSEU NEGATIVE 03/14/2020 1947   HGBUR LARGE (A) 03/14/2020 1947   BILIRUBINUR MODERATE (A) 03/14/2020 1947   KETONESUR 20 (A) 03/14/2020 1947   PROTEINUR 100 (A) 03/14/2020 1947   UROBILINOGEN 0.2 12/21/2014 1006   NITRITE NEGATIVE 03/14/2020 1947   LEUKOCYTESUR MODERATE (A) 03/14/2020 1947   Recent Results (from the past 240 hour(s))  SARS Coronavirus 2 by RT PCR (hospital order, performed in [place] hospital lab) Nasopharyngeal Nasopharyngeal Swab     Status: None   Collection Time: 03/14/20  9:02 PM   Specimen: Nasopharyngeal Swab  Result Value Ref Range Status   SARS Coronavirus 2 NEGATIVE NEGATIVE Final    Comment:  (NOTE) SARS-CoV-2 target nucleic acids are NOT DETECTED. The SARS-CoV-2 RNA is generally detectable in upper and lower respiratory specimens during the acute phase of infection. The lowest concentration of SARS-CoV-2 viral copies this assay can detect is 250 copies / mL. A negative result does not preclude SARS-CoV-2 infection and should not be used as the sole basis for treatment or other patient management decisions.  A negative result may occur with improper specimen collection / handling, submission of specimen other than nasopharyngeal swab, presence of viral mutation(s) within the areas targeted by this assay, and inadequate number of viral copies (<250 copies / mL). A negative result must be combined with clinical observations, patient history, and epidemiological information. Fact Sheet for Patients:   StrictlyIdeas.no Fact Sheet for Healthcare Providers: BankingDealers.co.za This test is not yet approved or cleared  by the [place] FDA and has been authorized for detection and/or diagnosis of SARS-CoV-2 by FDA under an Emergency Use Authorization (EUA).  This EUA will remain in effect (meaning this test can be used) for the duration of the COVID-19 declaration under Section 564(b)(1) of the Act, 21 U.S.C. section 360bbb-3(b)(1), unless the authorization is terminated or revoked sooner. Performed at [facility], [place] [address], [place], [place] [address]   Urine culture     Status: Abnormal   Collection Time: 03/14/20 10:31 PM   Specimen: In/Out Cath Urine  Result Value Ref Range Status   Specimen Description   Final    IN/OUT CATH URINE Performed at [facility] [address] [place], [place] [address]    Special Requests   Final    NONE Performed at [facility], [place] [address], [place], [place] [address]    Culture (A)  Final    >=100,000 COLONIES/mL ALPHA  HEMOLYTIC GRAM POSITIVE COCCI POSSIBLE AEROCOCCUS SPECIES Performed at [facility], [place] [address] [place], [place] [address]    Report Status 03/18/2020 FINAL  Final  Blood Culture (routine x 2)     Status: None (Preliminary result)   Collection Time: 03/14/20 10:50 PM   Specimen: BLOOD  Result Value Ref Range Status   Specimen Description   Final    BLOOD LEFT ANTECUBITAL Performed at [facility] [address] [place], [place] [address]    Special Requests   Final    BOTTLES DRAWN AEROBIC AND ANAEROBIC Blood Culture adequate volume Performed at [facility] [address], [place], [place] [address]    Culture   Final    NO GROWTH 4 DAYS Performed at [facility], [place] [address] [place], [place] [address]    Report Status PENDING  Incomplete  Blood Culture (routine x 2)     Status: None (Preliminary result)   Collection Time: 03/15/20 10:02 AM   Specimen: BLOOD  Result Value Ref Range Status   Specimen Description   Final    BLOOD RIGHT ARM Performed at [facility] [address], [place], [place] [address]    Special Requests   Final    BOTTLES DRAWN AEROBIC ONLY Blood Culture adequate volume Performed at [facility] [address], [place], [place] [address]    Culture   Final    NO GROWTH 4 DAYS Performed at [facility], [place] [address], [place],  [address]    Report Status PENDING  Incomplete      Radiology Studies: No results found.  Scheduled Meds: . sodium chloride   Intravenous Once  . amoxicillin-clavulanate  1 tablet Oral Q12H  . bictegravir-emtricitabine-tenofovir AF  1 tablet Oral Daily  . folic acid  1 mg Oral Daily  . lactulose  10 g Oral BID  . nicotine  14 mg Transdermal Daily  . pantoprazole  40 mg Oral q morning - 10a  . sodium chloride flush  3 mL Intravenous Q12H  . thiamine  100 mg Oral Daily  . topiramate  100 mg Oral QHS  .  valACYclovir  1,000 mg Oral Daily   Continuous Infusions:   LOS: 5 days   Time spent: 25 minutes.  Patrecia Pour, MD [facility] [email or web address] 03/19/2020, 7:45 PM

## 2020-03-19 NOTE — Evaluation (Addendum)
Physical Therapy Evaluation Patient Details Name: [name] MRN: [id number] DOB: [birth year] Today's Date: 03/19/2020   History of Present Illness  34 year old male with history of HIV, seizures, schizoaffective disorder, alcohol dependence who was admitted with Covid pneumonia in 10/2019 presented to ED now with lethargy, dysuria and confusion. Dx of sepsis, UTI, pulmonary edema 2* volume overload, metabolic encephalopathy.  Clinical Impression  Pt admitted with above diagnosis. Pt found lying in bed saturated with very large, liquid BM, pt unaware bed was soiled. Pt ambulated 220' holding handrail in hall, mild unsteadiness but no loss of balance. Recommended pt use a cane when ambulating.  Pt currently with functional limitations due to the deficits listed below (see PT Problem List). Pt will benefit from skilled PT to increase their independence and safety with mobility to allow discharge to the venue listed below.       Follow Up Recommendations Home health PT    Equipment Recommendations  Cane    Recommendations for Other Services       Precautions / Restrictions Precautions Precautions: None;Fall Precaution Comments: pt reports he fell just prior to admission, stated he takes meclizine for vertigo Restrictions Weight Bearing Restrictions: No      Mobility  Bed Mobility Overal bed mobility: Modified Independent             General bed mobility comments: used rail  Transfers Overall transfer level: Needs assistance Equipment used: Rolling walker (2 wheeled) Transfers: Sit to/from Stand Sit to Stand: Min guard         General transfer comment: VCs for safe hand placement  Ambulation/Gait Ambulation/Gait assistance: Min guard Gait Distance (Feet): 220 Feet Assistive device: 1 person hand held assist Gait Pattern/deviations: Step-through pattern;Decreased stride length;Wide base of support     General Gait Details: pt held handrail in hallway, when  handrail was unavailable he  was mildly unsteady but had no loss of balance  Stairs            Wheelchair Mobility    Modified Rankin (Stroke Patients Only)       Balance Overall balance assessment: Needs assistance   Sitting balance-Leahy Scale: Good       Standing balance-Leahy Scale: Fair Standing balance comment: mildly unsteady without UE support                             Pertinent Vitals/Pain Pain Assessment: No/denies pain    Home Living Family/patient expects to be discharged to:: Private residence Living Arrangements: Alone Available Help at Discharge: Other (Comment) ([place] aide 7 days a week for 2 hours) Type of Home: Apartment Home Access: 3 steps to enter, B rails     Home Layout: One level Home Equipment: Clinical cytogeneticist - 2 wheels      Prior Function Level of Independence: Independent with assistive device(s)         Comments: reports using a rw since Thursday after a fall but typically doesn't use a device. Independent with ADLs.     Hand Dominance   Dominant Hand: Right    Extremity/Trunk Assessment   Upper Extremity Assessment Upper Extremity Assessment: Defer to OT evaluation    Lower Extremity Assessment Lower Extremity Assessment: Generalized weakness    Cervical / Trunk Assessment Cervical / Trunk Assessment: Normal  Communication   Communication: No difficulties  Cognition Arousal/Alertness: Awake/alert Behavior During Therapy: WFL for tasks assessed/performed;Flat affect Overall Cognitive Status: No family/caregiver present to determine  baseline cognitive functioning                                 General Comments: pt found lying in very large, liquid BM saturating entire bed pad, pt was unaware he'd had a BM in the bed; pt alert/oriented, able to follow commands      General Comments      Exercises     Assessment/Plan    PT Assessment Patient needs continued PT services  PT  Problem List Decreased activity tolerance;Decreased balance;Decreased mobility       PT Treatment Interventions Gait training;Functional mobility training;Therapeutic exercise;Therapeutic activities;Patient/family education    PT Goals (Current goals can be found in the Care Plan section)  Acute Rehab PT Goals Patient Stated Goal: to be able to walk farther PT Goal Formulation: With patient Time For Goal Achievement: 04/02/20 Potential to Achieve Goals: Good    Frequency Min 3X/week   Barriers to discharge        Co-evaluation               AM-PAC PT "6 Clicks" Mobility  Outcome Measure Help needed turning from your back to your side while in a flat bed without using bedrails?: A Little Help needed moving from lying on your back to sitting on the side of a flat bed without using bedrails?: A Little Help needed moving to and from a bed to a chair (including a wheelchair)?: A Little Help needed standing up from a chair using your arms (e.g., wheelchair or bedside chair)?: None Help needed to walk in hospital room?: A Little Help needed climbing 3-5 steps with a railing? : A Little 6 Click Score: 19    End of Session Equipment Utilized During Treatment: Gait belt Activity Tolerance: Patient tolerated treatment well Patient left: in chair;with call bell/phone within reach;with chair alarm set Nurse Communication: Mobility status PT Visit Diagnosis: Unsteadiness on feet (R26.81);History of falling (Z91.81)    Time: 3254-9826 PT Time Calculation (min) (ACUTE ONLY): 31 min   Charges:   PT Evaluation $PT Eval Low Complexity: 1 Low PT Treatments $Gait Training: 8-22 mins       Blondell Reveal Kistler PT 03/19/2020  Acute Rehabilitation Services Pager [phone] Office [phone]

## 2020-03-19 NOTE — Plan of Care (Signed)
  Problem: Education: Goal: Knowledge of General Education information will improve Description Including pain rating scale, medication(s)/side effects and non-pharmacologic comfort measures Outcome: Progressing   Problem: Health Behavior/Discharge Planning: Goal: Ability to manage health-related needs will improve Outcome: Progressing   

## 2020-03-19 NOTE — Progress Notes (Signed)
[name] 9:52 AM  Subjective: Patient without any GI complaints specifically eating fine no pain moving his bowels fine about to work with physical therapy  Objective: Vital signs stable afebrile no acute distress patient not examined today looks the same CBC stable LFTs decreased  Assessment: Abnormal LFTs and patient with multiple medical problems  Plan: Please see yesterday's note for details but will be on standby to help as needed and will check labs tomorrow and call our team if needed during this hospital stay  [facility]  office [phone] After 5PM or if no answer call [phone]

## 2020-03-20 LAB — CBC WITH DIFFERENTIAL/PLATELET
Abs Immature Granulocytes: 1.28 10*3/uL — ABNORMAL HIGH (ref 0.00–0.07)
Basophils Absolute: 0.1 10*3/uL (ref 0.0–0.1)
Basophils Relative: 1 %
Eosinophils Absolute: 0.2 10*3/uL (ref 0.0–0.5)
Eosinophils Relative: 2 %
HCT: 28.9 % — ABNORMAL LOW (ref 39.0–52.0)
Hemoglobin: 8.8 g/dL — ABNORMAL LOW (ref 13.0–17.0)
Immature Granulocytes: 9 %
Lymphocytes Relative: 18 %
Lymphs Abs: 2.5 10*3/uL (ref 0.7–4.0)
MCH: 34.6 pg — ABNORMAL HIGH (ref 26.0–34.0)
MCHC: 30.4 g/dL (ref 30.0–36.0)
MCV: 113.8 fL — ABNORMAL HIGH (ref 80.0–100.0)
Monocytes Absolute: 1.5 10*3/uL — ABNORMAL HIGH (ref 0.1–1.0)
Monocytes Relative: 10 %
Neutro Abs: 8.7 10*3/uL — ABNORMAL HIGH (ref 1.7–7.7)
Neutrophils Relative %: 60 %
Platelets: 175 10*3/uL (ref 150–400)
RBC: 2.54 MIL/uL — ABNORMAL LOW (ref 4.22–5.81)
RDW: 20.6 % — ABNORMAL HIGH (ref 11.5–15.5)
WBC: 14.4 10*3/uL — ABNORMAL HIGH (ref 4.0–10.5)
nRBC: 0.1 % (ref 0.0–0.2)

## 2020-03-20 LAB — COMPREHENSIVE METABOLIC PANEL
ALT: 64 U/L — ABNORMAL HIGH (ref 0–44)
AST: 120 U/L — ABNORMAL HIGH (ref 15–41)
Albumin: 2.2 g/dL — ABNORMAL LOW (ref 3.5–5.0)
Alkaline Phosphatase: 139 U/L — ABNORMAL HIGH (ref 38–126)
Anion gap: 9 (ref 5–15)
BUN: 8 mg/dL (ref 6–20)
CO2: 21 mmol/L — ABNORMAL LOW (ref 22–32)
Calcium: 9.3 mg/dL (ref 8.9–10.3)
Chloride: 103 mmol/L (ref 98–111)
Creatinine, Ser: 0.91 mg/dL (ref 0.61–1.24)
GFR calc Af Amer: >60 mL/min (ref >60)
GFR calc non Af Amer: >60 mL/min (ref >60)
Glucose, Bld: 97 mg/dL (ref 70–99)
Potassium: 3.2 mmol/L — ABNORMAL LOW (ref 3.5–5.1)
Sodium: 133 mmol/L — ABNORMAL LOW (ref 135–145)
Total Bilirubin: 7 mg/dL — ABNORMAL HIGH (ref 0.3–1.2)
Total Protein: 7.7 g/dL (ref 6.5–8.1)

## 2020-03-20 LAB — CULTURE, BLOOD (ROUTINE X 2)
Culture: NO GROWTH
Culture: NO GROWTH
Special Requests: ADEQUATE
Special Requests: ADEQUATE

## 2020-03-20 LAB — LEPTOSPIRA AB SCREEN

## 2020-03-20 LAB — PROTIME-INR
INR: 1.3 — ABNORMAL HIGH (ref 0.8–1.2)
Prothrombin Time: 15.6 seconds — ABNORMAL HIGH (ref 11.4–15.2)

## 2020-03-20 MED ORDER — AMOXICILLIN-POT CLAVULANATE 875-125 MG PO TABS: 1.0000 | ORAL_TABLET | Freq: Two times a day (BID) | ORAL | 0 refills | Status: DC

## 2020-03-20 MED ORDER — LACTULOSE 10 GM/15ML PO SOLN: 10.0000 g | Freq: Two times a day (BID) | ORAL | 0 refills | Status: AC

## 2020-03-20 NOTE — Progress Notes (Signed)
Labs continue to improve please call my partner on call who I believe is Dr. Michail Sermon this weekend if any question or problem otherwise can follow-up with his ID specialist and outpatient liver specialist when discharged

## 2020-03-20 NOTE — TOC Progression Note (Signed)
Transition of Care [facility]) - Progression Note    Patient Details  Name: [name] MRN: [id number] Date of Birth: 1985-10-26  Transition of Care [facility]) CM/SW Contact  Purcell Mouton, RN Phone Number: 03/20/2020, 10:29 AM  Clinical Narrative:    Pt will discharge home with [facility]PT from [facility] to start Wednesday June 16th. Pt was okay with that.  Cane ordered from [facility].   Expected Discharge Plan: Home/Self Care Barriers to Discharge: No Barriers Identified  Expected Discharge Plan and Services Expected Discharge Plan: Home/Self Care   Discharge Planning Services: CM Consult   Living arrangements for the past 2 months: Single Family Home Expected Discharge Date: 03/20/20                                     Social Determinants of Health (SDOH) Interventions    Readmission Risk Interventions Readmission Risk Prevention Plan 11/01/2019  Home Care Screening Complete  Medication Review (RN CM) Complete  Some recent data might be hidden

## 2020-03-20 NOTE — Progress Notes (Signed)
Pt discharged to home instructions reviewed with patient and acknowledged understanding. SRP, RN

## 2020-03-20 NOTE — Care Management Important Message (Signed)
Important Message  Patient Details IM Letter given to Gabriel Earing RN Case Manager to present to the Patient Name: [name] MRN: [id number] Date of Birth: 1986/08/31   Medicare Important Message Given:  Yes     Kerin Salen 03/20/2020, 11:41 AM

## 2020-03-20 NOTE — Discharge Summary (Signed)
Physician Discharge Summary  [name]:[id number] DOB: [birth year] DOA: 03/14/2020  PCP: [name], MD  Admit date: 03/14/2020 Discharge date: 03/20/2020  Admitted From: Home Disposition: Home   Recommendations for Outpatient Follow-up:  1. Follow up with PCP in 1-2 weeks 2. Follow up with ID at [facility] per routine. Please make note RPR was reactive at 1:2 here, though it has been positive in the past (1:1) 2018, subsequently nonreactive. 3. Follow up with liver specialist as outpatient. Continue alcohol cessation counseling. Discharged on lactulose.  Home Health: PT Equipment/Devices: Cane Discharge Condition: Stable CODE STATUS: Full Diet recommendation: Heart healthy  Brief/Interim Summary: Admit date:03/14/2020 34 year old male with history of HIV, seizures, schizoaffective disorder, alcohol dependence who was admitted with Covid pneumonia in 10/2019 presented to ED now with lethargy, dysuria and confusion. BP 90/60 with EMS that improved in ED.  ED Course: Afebrile,saturating low 90s on room air, slightly tachycardic, and with stable blood pressure. sodium 127, potassium 3.1, alkaline phosphatase 187, albumin 2.3, AST "42, ALT 60, and total bilirubin 11.2.CBC features a new leukocytosis to 12,900,worsened chronic anemia with hemoglobin of 8.4, and thrombocytopenia with platelets 111,000. Fecal occult blood testing is negative, acetaminophen level undetectable, COVID-19 screening test also negative. Lactic acid was elevated to 2.5, then down to 1.6 with fluids. INR is 1.4. CXR unremarkable. UA positivefor UTI.CT head negative for acute intracranial abnormality. CT abdomen pelvis showedcellulitis, possibly developing abscess in the pubic area, bloating, hepatomegaly, cholelithiasis without evidence of acute cholecystitis. Cystitis with ascending UTI, no biliary dilatation or focal lesions.RUQ ultrasound reported layering sludge in the gallbladder without acute cholecystitis  or biliary dilatation  Hospital course: Patient received Vanc/cefepime/flagyl and admitted to [facility] for further evaluation and management of sepsis, cystitis, hepatic failure and metabolic encephalopathy. GI was also consulted as was ID. Fortunately with supportive care, LFTs have shown steady improvement, felt to be consistent with alcoholic hepatitis improved with cessation. Lactulose was given and will be continued as this caused significant improvement in mentation. He has worked with PT and demonstrated functional independence prior to discharge.  Discharge Diagnoses:  Principal Problem:   Sepsis secondary to UTI [facility]) Active Problems:   Schizoaffective disorder, depressive type ([place])   Severe alcohol dependence (HCC)   Hypokalemia   Cellulitis of groin   Elevated LFTs   HIV disease (HCC)   Acute encephalopathy   Hyponatremia   Macrocytic anemia   Thrombocytopenia (HCC)   Prolonged QT interval  Acute metabolic encephalopathy:  - Mentation improved with increased lactulose. Ammonia confirmed to be mildly elevated.  - Continue lactulose. - Avoid sedating medications  Sepsis secondarytopubic cellulitis/abscess, UTI/hemorrhagic cystitis: Patient met sepsis criteria at the time of admission with leukocytosis, lactic acidosis, hypotension, UA positive for UTI, CT with developing abscess involving pubic area and groin.  - Narrowed vanc/cefepime/flagyl to augmentin per ID recommendations to cover UTI (alpha-hemolytic GPC, possible aerococcus) and SSTI. Remains afebrile. - Urology recommended treating as hemorrhagic cystitis. No concern for fistula on CT.  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 Discriminant function 31. Hepatitis panel negative, Hepatomegaly with steatosis on CT/US.  - [facility] consulted, no further recommendations, anticipate continued improvement. - Seen by  GI and not a candidate for liver transplant.Bx results from [place] hepatic steatosis. LFTs now downtrending  Hypervolemichyponatremia: Presented with sodium of 127, received 3.5 L saline in ED,gotcongested -off IV fluids/ received lasix. Resolved.   Hemorrhagic cystitis:  - Consulted urology, no further recommendations. This  has cleared with abx.  Dyspnea/acute pulmonary edemadue to volume overload/hypoalbuminemia:Patient received 3.5 L fluid boluses in the ED. Chest x-ray showed cardiomegaly and pulmonary vascular congestion. DCed IV fluids, Lasix 40 mg IV x1 on 6/6-2D echo 1/21 had shown EF of 60 to 65%, may have underlying diastolic CHF. Placed on duo nebs, nicotine patch given history of tobacco use, may have underlying COPD.  - Resolved and Saturating well on RA now  Acute blood loss superimposed on AOCD: - s/p 1u PRBCs 6/8. Check CBC at follow up. No bleeding noted.  Hypokalemia:  - Continue supplementation.  Polysubstance abuse, alcohol abuse, tobacco use:  - States he will not be drinking any more.   HIV disease: CD4 790, viral load undetectable in 10/2019.  - Continue ART, unlikely to be cause of LFT abnormalities.   Prolonged QT interval-QTC 515 on admission. - Minimize QT prolonging meds  Schizoaffective disorder: No current psychotic features or dysthymia.  - Ok to restart home medications cautiously.  Obesity: Body mass index is 33.95 kg/m.   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 Discussed with ID, Dr. Tommy Medal who recommends no current treatment, will follow up with ID as outpatient.   Discharge Instructions Discharge Instructions    Diet - low sodium heart healthy   Complete by: As directed    Discharge instructions   Complete by: As directed    Continue taking lactulose twice daily to help with confusion/hepatic encephalopathy  Continue taking augmentin twice daily for 7 days  Follow  up with your infectious disease doctor as an outpatient as scheduled.   Do NOT drink any alcohol.  If your symptoms return, seek medical attention.   Increase activity slowly   Complete by: As directed      Allergies as of 03/20/2020      Reactions   Dapsone Other (See Comments)   Per centricity "G6PD deficient"   Primaquine Phosphate Other (See Comments)   Per Centricity "G6PD deficient"      Medication List    STOP taking these medications   sulfamethoxazole-trimethoprim 800-160 MG tablet Commonly known as: BACTRIM DS     TAKE these medications   albuterol 108 (90 Base) MCG/ACT inhaler Commonly known as: VENTOLIN HFA Inhale 2 puffs into the lungs every 6 (six) hours as needed for wheezing or shortness of breath.   alprazolam 2 MG tablet Commonly known as: XANAX Take 2 mg by mouth 3 (three) times daily.   amoxicillin-clavulanate 875-125 MG tablet Commonly known as: AUGMENTIN Take 1 tablet by mouth every 12 (twelve) hours.   benzonatate 100 MG capsule Commonly known as: TESSALON Take 200 mg by mouth every 8 (eight) hours as needed for cough.   bictegravir-emtricitabine-tenofovir AF 50-200-25 MG Tabs tablet Commonly known as: BIKTARVY Take 1 tablet by mouth daily.   cetirizine 10 MG tablet Commonly known as: ZYRTEC Take 1 tablet (10 mg total) by mouth daily.   famotidine 40 MG tablet Commonly known as: PEPCID Take 40 mg by mouth daily.   FLUoxetine 40 MG capsule Commonly known as: PROZAC Take 40 mg by mouth daily.   folic acid 1 MG tablet Commonly known as: FOLVITE Take 1 mg by mouth daily.   furosemide 40 MG tablet Commonly known as: LASIX Take 40 mg by mouth daily.   haloperidol 5 MG tablet Commonly known as: HALDOL Take 5 mg by mouth daily.   hydrOXYzine 50 MG tablet Commonly known as: ATARAX/VISTARIL Take 1  tablet (50 mg total) by mouth every 6 (six) hours as needed for anxiety.   lactulose 10 GM/15ML solution Commonly known as:  CHRONULAC Take 15 mLs (10 g total) by mouth 2 (two) times daily.   Linzess 145 MCG Caps capsule Generic drug: linaclotide Take 145 mcg by mouth daily as needed (constipation).   Lurasidone HCl 120 MG Tabs Take 1 tablet (120 mg total) by mouth daily.   meclizine 25 MG tablet Commonly known as: ANTIVERT Take 25 mg by mouth every 8 (eight) hours as needed for dizziness.   ondansetron 8 MG tablet Commonly known as: ZOFRAN Take 8 mg by mouth every 8 (eight) hours as needed for nausea/vomiting.   pantoprazole 40 MG tablet Commonly known as: PROTONIX Take 40 mg by mouth every morning.   promethazine 12.5 MG tablet Commonly known as: PHENERGAN Take 12.5 mg by mouth 2 (two) times daily as needed for nausea or vomiting.   thiamine 100 MG tablet Commonly known as: Vitamin B-1 Take 100 mg by mouth daily.   topiramate 100 MG tablet Commonly known as: TOPAMAX Take 1 tablet (100 mg total) by mouth at bedtime.   trazodone 300 MG tablet Commonly known as: DESYREL Take 1 tablet (300 mg total) by mouth at bedtime as needed for sleep. What changed: when to take this   valACYclovir 1000 MG tablet Commonly known as: VALTREX Take 1 tablet (1,000 mg total) by mouth daily.   Vitamin D (Ergocalciferol) 1.25 MG (50000 UNIT) Caps capsule Commonly known as: DRISDOL Take 50,000 Units by mouth once a week.   zolpidem 10 MG tablet Commonly known as: AMBIEN Take 10 mg by mouth at bedtime.            Durable Medical Equipment  (From admission, onward)         Start     Ordered   03/20/20 1023  For home use only DME Cane  Once        03/20/20 1023          Follow-up Information    [facility] Follow up.   Specialty: [place] Why: Will call you to make appointment to come out Wednesday June 16th. Please call if you have not heard from them or have any questions.  Contact information: [address] [address] [phone]         [facility] Follow up.   Why: Your Kasandra Knudsen is from [place]. Please call above number if ou have any problems.  Contact information: [place] [address] [phone]        [name], MD. Schedule an appointment as soon as possible for a visit in 1 week(s).   Specialty: Internal Medicine Contact information: [place] [address] [phone]              Allergies  Allergen Reactions  . Dapsone Other (See Comments)    Per centricity "G6PD deficient"  . Primaquine Phosphate Other (See Comments)    Per Centricity "G6PD deficient"    Consultations:  GI  ID  Urology  Procedures/Studies: CT Head Wo Contrast  Result Date: 03/14/2020 CLINICAL DATA:  Dizziness and lethargy. EXAM: CT HEAD WITHOUT CONTRAST TECHNIQUE: Contiguous axial images were obtained from the base of the skull through the vertex without intravenous contrast. COMPARISON:  October 27, 2019 FINDINGS: Brain: No evidence of acute infarction, hemorrhage, hydrocephalus, extra-axial collection or mass lesion/mass effect. There is mild bilateral basal ganglia  calcification. Vascular: No hyperdense vessel or unexpected calcification. Skull: Normal. Negative for fracture or focal lesion. Sinuses/Orbits: No acute finding. Other: None. IMPRESSION: No acute intracranial pathology. Electronically Signed   By: Virgina Norfolk M.D.   On: 03/14/2020 22:14   CT ABDOMEN PELVIS W CONTRAST  Result Date: 03/14/2020 CLINICAL DATA:  Jaundice, burning urination, history of HIV and COVID-19 EXAM: CT ABDOMEN AND PELVIS WITH CONTRAST TECHNIQUE: Multidetector CT imaging of the abdomen and pelvis was performed using the standard protocol following bolus administration of intravenous contrast. CONTRAST:  176m OMNIPAQUE IOHEXOL 300 MG/ML  SOLN COMPARISON:  MRI 08/27/2018, ultrasound 08/01/2018 FINDINGS: Lower chest: Few patchy multifocal ground-glass opacities in the lung bases. No effusion.  Normal heart size. No pericardial effusion. Hepatobiliary: Mild hepatomegaly with diffuse hepatic hypoattenuation sparing along the gallbladder fossa most compatible with hepatic steatosis seen on comparison MR. Smooth liver surface contour. No focal liver lesion. Layering gallstones within the otherwise normal gallbladder. No visible intraductal gallstones or biliary dilatation. Pancreas: There is some questionable stranding versus motion artifact near the body and tail of the pancreas. No focal concerning lesion. No duct dilatation. Spleen: Normal in size without focal abnormality. Adrenals/Urinary Tract: Normal adrenal glands. Kidneys enhance symmetrically. No concerning renal lesion. No urolithiasis or hydronephrosis. Some mild urothelial thickening of the ureters is noted. There is circumferential bladder wall thickening with mucosal hyperemia and small focus of intraluminal gas. Stomach/Bowel: Distal esophagus, stomach and duodenal sweep are unremarkable. No small bowel wall thickening or dilatation. No evidence of obstruction. A normal appendix is visualized. No colonic dilatation or wall thickening. Vascular/Lymphatic: Atherosclerotic calcifications throughout the abdominal aorta and branch vessels. No aneurysm or ectasia. Several borderline enlarged and possibly reactive nodes are present in the inguinal chains. No other enlarged lymph nodes. Reproductive: The prostate and seminal vesicles are unremarkable. Included portions of the external genitalia are unremarkable. Other: Multiple areas of skin thickening and subcutaneous stranding as well as possible developing rim enhancing collections in the subcutaneous soft tissues of the anterior mons pubis including a collection measuring 1.8 x 2.0 x 1.7 cm. Stranding and inflammation from this location extends to the base of the penile shaft. Additional areas of focal skin thickening noted along the right gluteal cleft and anterior thigh soft tissues. Small fat  containing umbilical hernia. No abdominopelvic free air or fluid. 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 No other acute or suspicious osseous lesions. IMPRESSION: 1. Multiple areas of skin thickening and subcutaneous stranding as well as possible developing rim enhancing collections in the subcutaneous soft tissues, most pronounced of the anterior mons pubis including a subcutaneous collection measuring 1.8 x 2.0 x 1.7 cm. Stranding and inflammation from this location extends to the base of the penile shaft. Additional areas of focal skin thickening noted along the right gluteal cleft and anterior thigh soft tissues. Findings are concerning for cellulitis with possible developing abscesses. Recommend correlation with direct visual inspection. 2. Circumferential bladder wall thickening with mucosal hyperemia and small focus of intraluminal gas. Findings are concerning for cystitis with ascending urinary tract infection. 3. 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 4. Few patchy multifocal ground-glass opacities in the lung bases compatible with COVID-19 pneumonia. 5. Mild hepatomegaly with diffuse hepatic hypoattenuation sparing along the gallbladder fossa most compatible with hepatic steatosis seen on comparison MR. 6.  Cholelithiasis without evidence of acute cholecystitis. If there is persisting clinical concern, correlate with right upper quadrant ultrasound. 7. Aortic Atherosclerosis (ICD10-I70.0). These results were called by telephone at the time of interpretation on 03/14/2020 at 10:20 pm to provider [facility] , who verbally acknowledged these results. Electronically Signed   By: Lovena Le M.D.   On: 03/14/2020 22:20   US Abdomen Limited  Result Date: 03/14/2020 CLINICAL  DATA:  Transaminitis.  Gallstone. EXAM: ULTRASOUND ABDOMEN LIMITED RIGHT UPPER QUADRANT COMPARISON:  Abdominal CT earlier today. Abdominal ultrasound 08/01/2018. FINDINGS: Gallbladder: Physiologically distended. Layering sludge but no evidence of shadowing stone. No wall thickening or pericholecystic fluid. No sonographic Murphy sign noted by sonographer. Common bile duct: Diameter: 6 mm. Liver: No focal lesion identified. Diffusely increased and heterogeneous in parenchymal echogenicity. Portal vein is patent on color Doppler imaging with normal direction of blood flow towards the liver. Other: No right upper quadrant ascites. IMPRESSION: 1. Layering sludge in the gallbladder without stones or sonographic findings of acute cholecystitis. No biliary dilatation. 2. Diffusely increased and heterogeneous parenchymal echogenicity consistent with steatosis. Electronically Signed   By: Keith Rake M.D.   On: 03/14/2020 23:25   DG CHEST PORT 1 VIEW  Result Date: 03/15/2020 CLINICAL DATA:  Lethargy.  Altered mental status.  Wheezing. EXAM: PORTABLE CHEST 1 VIEW COMPARISON:  03/14/2020 FINDINGS: Stable elevation of the RIGHT hemidiaphragm. Heart size is mildly enlarged. There is increased perihilar infiltrate and pulmonary vascular congestion, increased. IMPRESSION: Cardiomegaly and pulmonary vascular congestion. Electronically Signed   By: Nolon Nations M.D.   On: 03/15/2020 09:24   DG Chest Port 1 View  Result Date: 03/14/2020 CLINICAL DATA:  Dizziness and lethargy. EXAM: PORTABLE CHEST 1 VIEW COMPARISON:  October 27, 2019 FINDINGS: There is no evidence of acute infiltrate, pleural effusion or pneumothorax. There is mild, stable elevation of the right hemidiaphragm. The heart size and mediastinal contours are within normal limits. The visualized skeletal structures are unremarkable. IMPRESSION: No active disease. Electronically Signed   By: Virgina Norfolk M.D.   On: 03/14/2020 21:33       Subjective: Sitting on side of the bed in street clothes asking to go home. Feels well. Mentating normally. Able to teach back medications at discharge, and directions to abstain from alcohol and follow up with hepatology and ID. Has appt later this month.  Discharge Exam: Vitals:   03/19/20 2024 03/20/20 0444  BP: 109/67 130/83  Pulse: 90 96  Resp:  20  Temp: 98.7 F (37.1 C) 98.8 F (37.1 C)  SpO2:  98%   General: Pt is alert, awake, not in acute distress Cardiovascular: RRR, S1/S2 +, no rubs, no gallops Respiratory: CTA bilaterally, no wheezing, no rhonchi Abdominal: Soft, NT, ND, bowel sounds + Extremities: No edema, no cyanosis Skin: Mild jaundice, improved.   Labs: BNP (last 3 results) Recent Labs    03/14/20 1952 03/15/20 1002  BNP 59.4 38.1   Basic Metabolic Panel: Recent Labs  Lab 03/15/20 1002 03/15/20 1002 03/16/20 0446 03/16/20 0446 03/16/20 1816 03/17/20 0527 03/18/20 0444 03/19/20 0436 03/20/20 0430  NA 130*   < > 128*  --   --  133* 135 135 133*  K 3.0*   < > 2.6*   < > 2.9* 2.9* 2.9* 3.3* 3.2*  CL 94*   < > 93*  --   --  99 102 105 103  CO2 22   < > 24  --   --  _0 21*  GLUCOSE 93   < > 124*  --   --  107* 106* 106* 97  BUN 10   < > 10  --   --  _0 CREATININE 0.85   < > 0.90  --   --  0.87 0.91 0.79 0.91  CALCIUM 8.6*   < > 8.2*  --   --  8.6* 9.0 9.2 9.3  MG 2.3  --   --   --   --   --   --   --   --   PHOS 2.1*  --   --   --   --   --   --   --   --    < > = values in this interval not displayed.   Liver Function Tests: Recent Labs  Lab 03/16/20 0446 03/17/20 0527 03/18/20 0444 03/19/20 0436 03/20/20 0430  AST 315* 202* 161* 137* 120*  ALT 103* 82* 71* 66* 64*  ALKPHOS 158* 142* 138* 131* 139*  BILITOT 10.1* 9.5* 9.8* 8.5* 7.0*  PROT 6.9 6.8 7.1 7.1 7.7  ALBUMIN 2.0* 2.0* 2.1* 2.0* 2.2*   Recent Labs  Lab 03/14/20 1947  LIPASE 60*   Recent Labs  Lab 03/14/20 1947 03/19/20 0436  AMMONIA 29 40*    CBC: Recent Labs  Lab 03/16/20 0446 03/17/20 0527 03/18/20 0444 03/19/20 0436 03/20/20 0430  WBC 10.1 12.1* 14.0* 12.4* 14.4*  NEUTROABS 7.5 8.5* 9.0* 7.6 8.7*  HGB 7.2* 7.0* 8.1* 8.3* 8.8*  HCT 22.5* 22.2* 25.2* 27.1* 28.9*  MCV 105.1* 109.4* 107.7* 112.0* 113.8*  PLT 124* 134* 161 164 175   Cardiac Enzymes: No results for input(s): CKTOTAL, CKMB, CKMBINDEX, TROPONINI in the last 168 hours. BNP: Invalid input(s): POCBNP CBG: No results for input(s): GLUCAP in the last 168 hours. D-Dimer No results for input(s): DDIMER in the last 72 hours. Hgb A1c No results for input(s): HGBA1C in the last 72 hours. Lipid Profile No results for input(s): CHOL, HDL, LDLCALC, TRIG, CHOLHDL, LDLDIRECT in the last 72 hours. Thyroid function studies No results for input(s): TSH, T4TOTAL, T3FREE, THYROIDAB in the last 72 hours.  Invalid input(s): FREET3 Anemia work up No results for input(s): VITAMINB12, FOLATE, FERRITIN, TIBC, IRON, RETICCTPCT in the last 72 hours. Urinalysis    Component Value Date/Time   COLORURINE AMBER (A) 03/14/2020 1947   APPEARANCEUR CLOUDY (A) 03/14/2020 1947   LABSPEC 1.011 03/14/2020 1947   PHURINE 6.0 03/14/2020 1947   GLUCOSEU NEGATIVE 03/14/2020 1947   HGBUR LARGE (A) 03/14/2020 1947   BILIRUBINUR MODERATE (A) 03/14/2020 1947   KETONESUR 20 (A) 03/14/2020 1947   PROTEINUR 100 (A) 03/14/2020 1947   UROBILINOGEN 0.2 12/21/2014 1006   NITRITE NEGATIVE 03/14/2020 1947   LEUKOCYTESUR MODERATE (A) 03/14/2020 1947    Microbiology Recent Results (from the past 240 hour(s))  SARS Coronavirus 2 by RT PCR (hospital order, performed in [facility] hospital lab) Nasopharyngeal Nasopharyngeal Swab     Status: None   Collection Time: 03/14/20  9:02 PM   Specimen: Nasopharyngeal Swab  Result Value Ref Range Status   SARS Coronavirus 2 NEGATIVE NEGATIVE Final    Comment: (NOTE) SARS-CoV-2 target nucleic acids are NOT DETECTED. The SARS-CoV-2 RNA is generally  detectable in upper and lower respiratory specimens during the acute phase of infection. The lowest concentration of SARS-CoV-2 viral copies this assay can detect is 250 copies / mL. A negative result does not preclude SARS-CoV-2 infection and should not be used  as the sole basis for treatment or other patient management decisions.  A negative result may occur with improper specimen collection / handling, submission of specimen other than nasopharyngeal swab, presence of viral mutation(s) within the areas targeted by this assay, and inadequate number of viral copies (<250 copies / mL). A negative result must be combined with clinical observations, patient history, and epidemiological information. Fact Sheet for Patients:   StrictlyIdeas.no Fact Sheet for Healthcare Providers: BankingDealers.co.za This test is not yet approved or cleared  by the [place] FDA and has been authorized for detection and/or diagnosis of SARS-CoV-2 by FDA under an Emergency Use Authorization (EUA).  This EUA will remain in effect (meaning this test can be used) for the duration of the COVID-19 declaration under Section 564(b)(1) of the Act, 21 U.S.C. section 360bbb-3(b)(1), unless the authorization is terminated or revoked sooner. Performed at [facility], [place] [address], [place], [place] [address]   Urine culture     Status: Abnormal   Collection Time: 03/14/20 10:31 PM   Specimen: In/Out Cath Urine  Result Value Ref Range Status   Specimen Description   Final    IN/OUT CATH URINE Performed at [facility] [address] [place], [place] [address]    Special Requests   Final    NONE Performed at [facility], [place] [address] [place], [place] [address]    Culture (A)  Final    >=100,000 COLONIES/mL ALPHA HEMOLYTIC GRAM POSITIVE COCCI POSSIBLE AEROCOCCUS SPECIES Performed at [facility], [place] [address] [place], [place] [address]    Report Status 03/18/2020 FINAL  Final  Blood Culture (routine x 2)     Status: None   Collection Time: 03/14/20 10:50 PM   Specimen: BLOOD  Result Value Ref Range Status   Specimen Description   Final    BLOOD LEFT ANTECUBITAL Performed at [facility] [place] [address] [place], [place] [address]    Special Requests   Final    BOTTLES DRAWN AEROBIC AND ANAEROBIC Blood Culture adequate volume Performed at [facility] [address], [place], [place] [address]    Culture   Final    NO GROWTH 5 DAYS Performed at [facility], [place] [address] [place], [place] [address]    Report Status 03/20/2020 FINAL  Final  Blood Culture (routine x 2)     Status: None   Collection Time: 03/15/20 10:02 AM   Specimen: BLOOD  Result Value Ref Range Status   Specimen Description   Final    BLOOD RIGHT ARM Performed at [facility] [address], [place], [place] [address]    Special Requests   Final    BOTTLES DRAWN AEROBIC ONLY Blood Culture adequate volume Performed at [facility] [address] [place], [place] [address]    Culture   Final    NO GROWTH 5 DAYS Performed at [facility], [place] [address] [place], [place] [address]    Report Status 03/20/2020 FINAL  Final    Time coordinating discharge: Approximately 40 minutes  Patrecia Pour, MD  [facility] 03/20/2020, 10:55 AM

## 2020-03-20 NOTE — TOC Progression Note (Signed)
Transition of Care [facility]) - Progression Note    Patient Details  Name: [name] MRN: [id number] Date of Birth: [birth year]  Transition of Care [facility]) CM/SW Contact  Purcell Mouton, RN Phone Number: 03/20/2020, 10:18 AM  Clinical Narrative:    Pt states that he was active with [facility]. Checking with [facility] agencies now. Alvis Lemmings rep states that [employer] took pt from [facility]. Checking with all agencies for HHPT/RN.    Expected Discharge Plan: Home/Self Care Barriers to Discharge: No Barriers Identified  Expected Discharge Plan and Services Expected Discharge Plan: Home/Self Care   Discharge Planning Services: CM Consult   Living arrangements for the past 2 months: Single Family Home Expected Discharge Date: 03/20/20                                     Social Determinants of Health (SDOH) Interventions    Readmission Risk Interventions Readmission Risk Prevention Plan 11/01/2019  Home Care Screening Complete  Medication Review (RN CM) Complete  Some recent data might be hidden

## 2020-06-20 IMAGING — US US EXTREM LOW*L* LIMITED
1 series · 14 of 25 positions shown · non-contrast
Comparison: None.

CLINICAL DATA: Leg swelling to the left calf.  History of HIV.

EXAM:
ULTRASOUND left LOWER EXTREMITY LIMITED
TECHNIQUE: Ultrasound examination of the lower extremity soft tissues was
performed in the area of clinical concern.

[Series 1: us extrem low*left* limited · 14 of 38 slices shown]
[im 1/38]
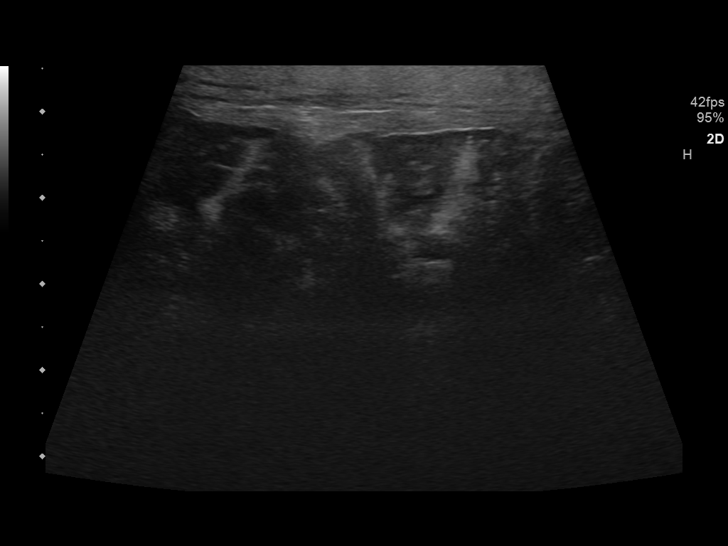
[im 4/38]
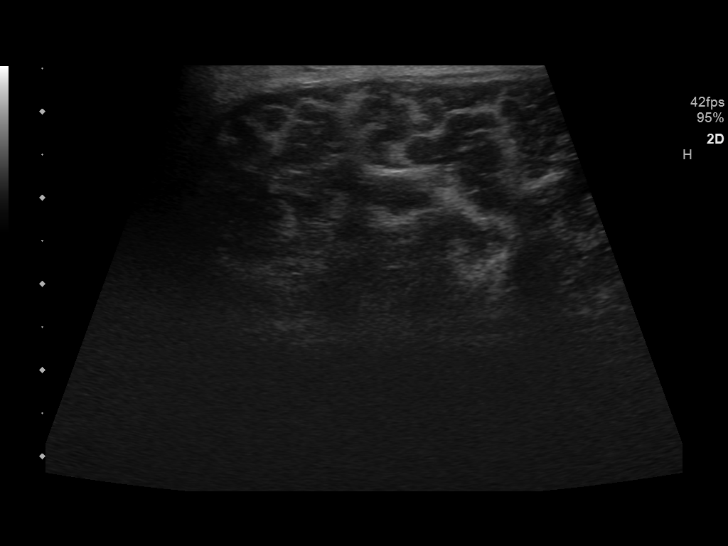
[im 7/38]
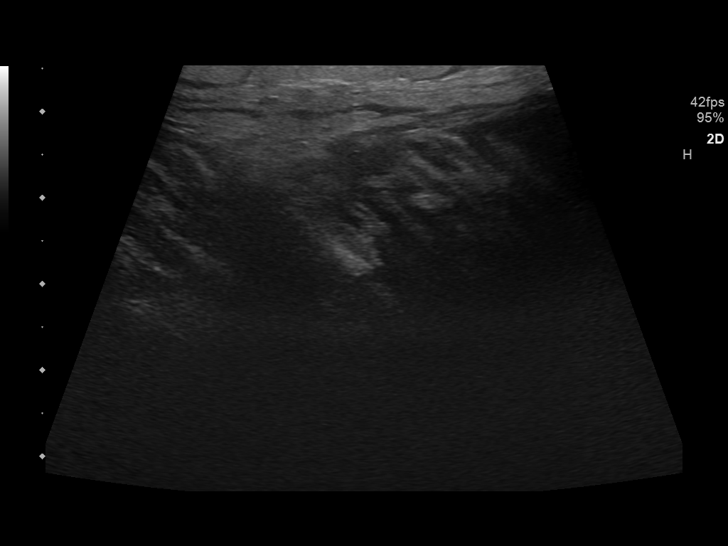
[im 10/38]
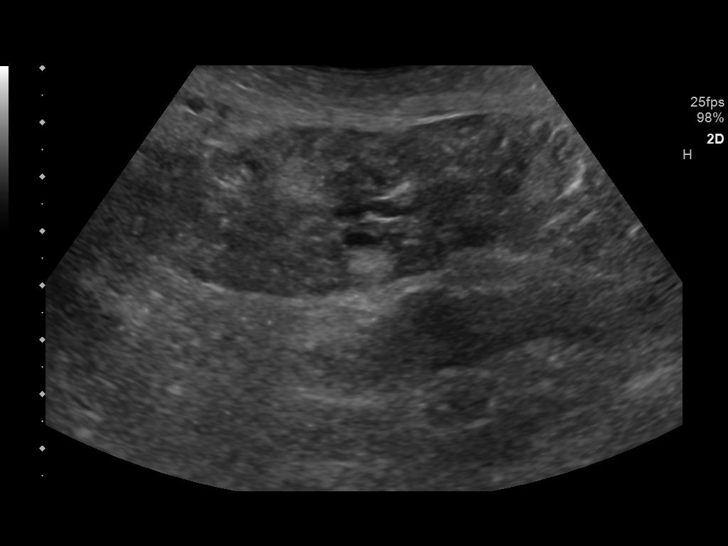
[im 13/38]
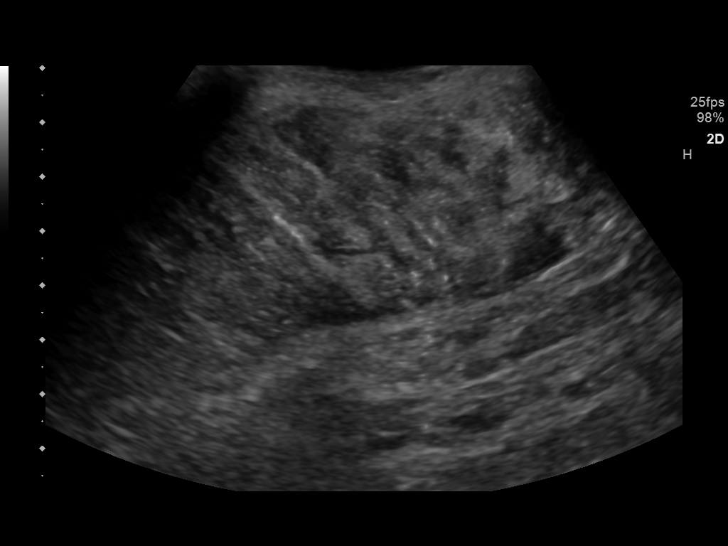
[im 14/38]
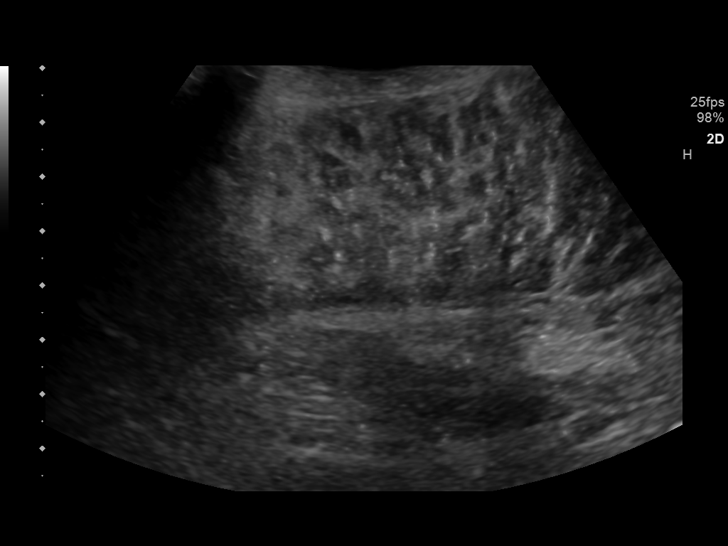
[im 17/38]
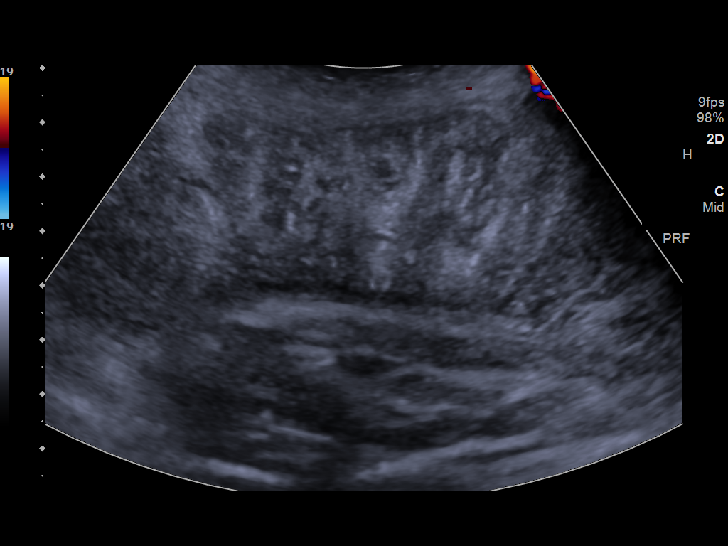
[im 21/38]
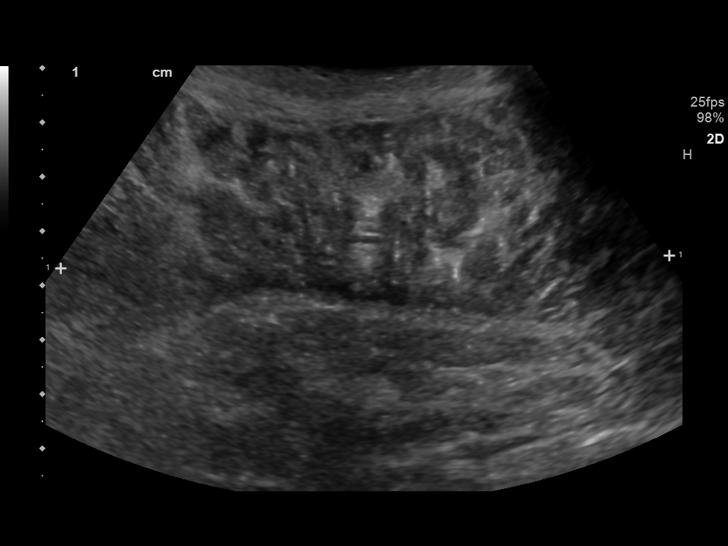
[im 24/38]
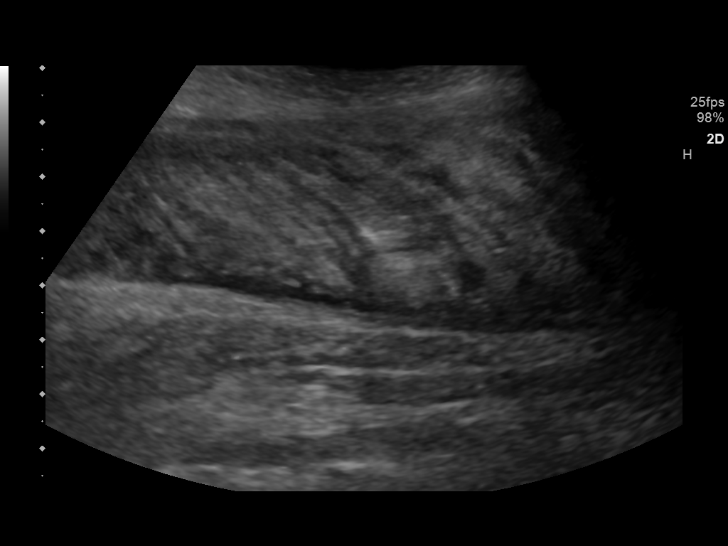
[im 25/38]
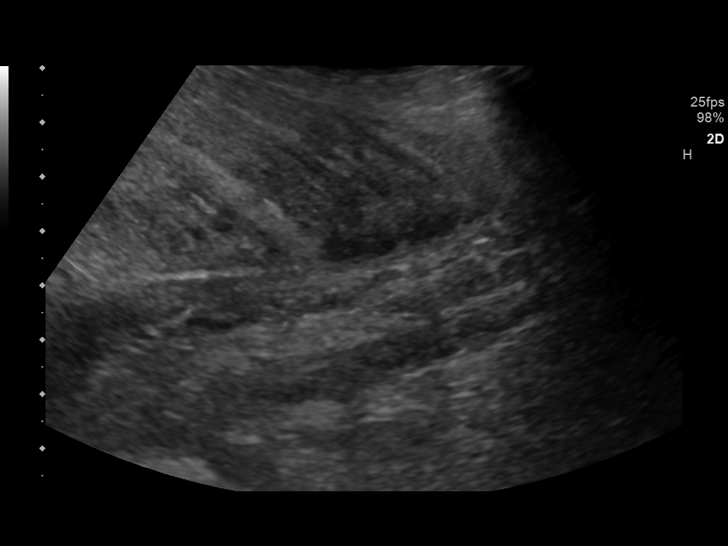
[im 28/38]
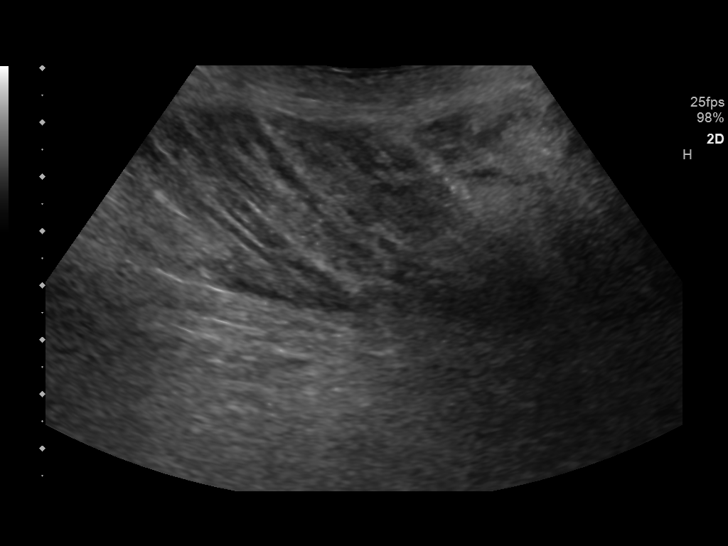
[im 31/38]
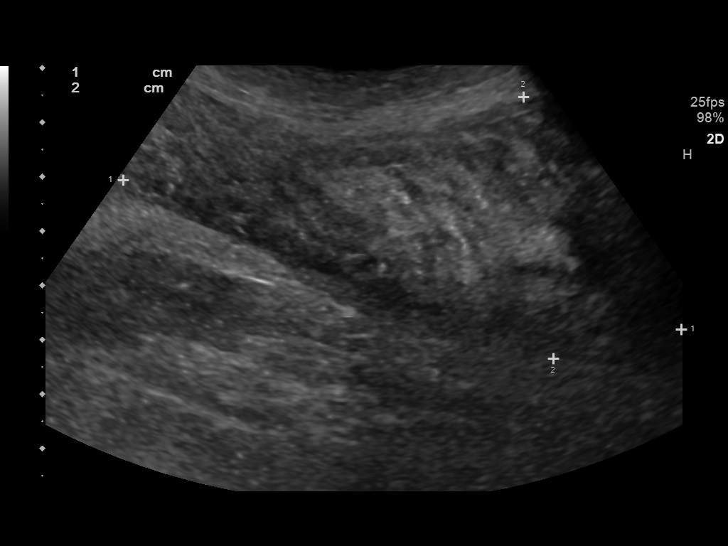
[im 34/38]
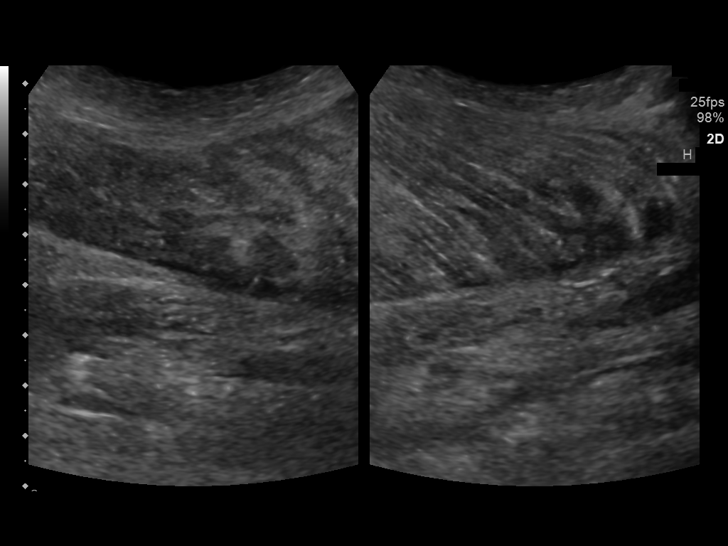
[im 38/38]
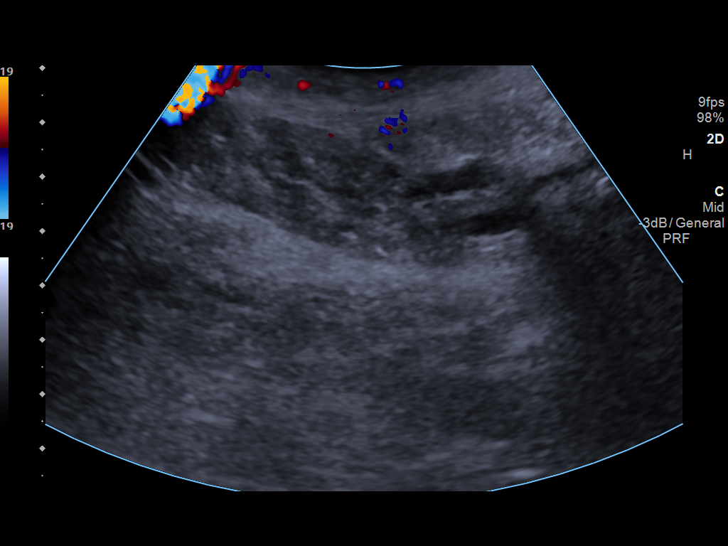

[14 of 25 positions shown; findings below may reference images not displayed]

FINDINGS: Ultrasound examination of the soft tissues of the left calf in the
area of abnormality demonstrate a heterogeneous solid-appearing mass
like structure measuring 19.3 x 4.8 x 11.2 cm. Striation is within
this structure suggested to be of muscular origin. This may
represent myositis, muscle injury, hematoma, or a soft tissue mass.
Consider MRI for further evaluation. No loculated fluid collections.
IMPRESSION: Heterogeneous masslike structure in the left calf corresponding to
the area of abnormality. This could represent myositis, muscle
injury, hematoma, or soft tissue mass. Consider MRI for further
evaluation if clinically indicated.

## 2020-08-06 ENCOUNTER — Telehealth (INDEPENDENT_AMBULATORY_CARE_PROVIDER_SITE_OTHER): Payer: Medicare HMO | Admitting: Neurology

## 2020-08-06 ENCOUNTER — Other Ambulatory Visit: Payer: Self-pay

## 2020-08-06 ENCOUNTER — Encounter: Payer: Self-pay | Admitting: Neurology

## 2020-08-06 ENCOUNTER — Ambulatory Visit: Payer: Medicare HMO | Admitting: Neurology

## 2020-08-06 ENCOUNTER — Telehealth: Payer: Self-pay | Admitting: Emergency Medicine

## 2020-08-06 DIAGNOSIS — G603 Idiopathic progressive neuropathy: Secondary | ICD-10-CM | POA: Diagnosis not present

## 2020-08-06 DIAGNOSIS — G43719 Chronic migraine without aura, intractable, without status migrainosus: Secondary | ICD-10-CM

## 2020-08-06 MED ORDER — TOPIRAMATE 100 MG PO TABS: 100.0000 mg | ORAL_TABLET | Freq: Every day | ORAL | 1 refills | Status: DC

## 2020-08-06 NOTE — Progress Notes (Signed)
I have read the note, and I agree with the clinical assessment and plan.  Grisel Blumenstock K Randall Rampersad   

## 2020-08-06 NOTE — Telephone Encounter (Signed)
-----   Message from Suzzanne Cloud, NP sent at 08/06/2020 11:05 AM EDT ----- Could you please schedule him with Dr. Jannifer Franklin for an IN OFFICE visit in about 3 months; is overall stable but needs a good physical exam for his conditions, please let him know appointments with Dr. Jannifer Franklin are limited so NOT to miss it.

## 2020-08-06 NOTE — Progress Notes (Signed)
Virtual Visit via Video Note  I connected with [name] on 08/06/20 at 10:15 AM EDT by a video enabled telemedicine application and verified that I am speaking with the correct person using two identifiers.  Location: Patient: at his home Provider: in the office    I discussed the limitations of evaluation and management by telemedicine and the availability of in person appointments. The patient expressed understanding and agreed to proceed.  History of Present Illness: 08/06/2020 SS: [name] is a 34 year old male with history of intractable migraine headache, chronic HIV.  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 NCV in December 2020 showed evidence of fairly significant primarily axonal peripheral neuropathy, EMG of the right arm and right leg did not show clear evidence of an underlying myopathic disorder.  He is on Topamax 100 mg at bedtime for his headaches, headaches are well controlled as long as he takes the medicine.  At last visit, having issues with balance and gait, had 5 weeks of PT/OT via [facility] with good benefit.  No falls, balance can still be off, has a cane and a walker. Hospitalized in June for sepsis had pubic cellulitis, UTI, alcoholic/autoimmune hepatitis.  Continues to report numbness from the knees down bilaterally, some stiffness.  He has been taken out of work since his hospitalization in June.  Sleeping well on trazodone, off Ambien.  Is able to drive.  Reportedly still has swelling to the left calf, left groin (felt to be ingrown hair?).  HIV reportedly stable. Reportedly only drinks socially, every 2 weeks. Presents today for evaluation via telephone visit.  02/17/2020 SS: [name] is a 34 year old male with history of intractable migraine headache.  He has chronic HIV.  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  Nerve  conduction evaluation in December 2020 showed evidence of a fairly significant primarily axonal peripheral neuropathy, EMG of the right arm and right leg do not show clear evidence of an underlying myopathic disorder. He has some numbness in the feet bilaterally. He was hospitalized for a significant Covid infection in January, feels following Covid, has had more significant issues with balance and walking. He continues to have enlargement of bilateral gastrocnemius muscles, left more than right, says primary gives him some fluid pills, that helps. His headaches come and go, remains on Topamax 100 mg at bedtime. He continues to drive a car. He presents today for evaluation unaccompanied.   Observations/Objective: Via telephone visit, is alert and oriented, speech is clear and concise  Assessment and Plan: 1.  Migraine headache 2.  Peripheral neuropathy, NCV shows evidence of fairly significant primarily axonal peripheral neuropathy, EMG of the right arm and right leg do not show clear evidence of underlying myopathic disorder 3.  HIV on chronic therapy 4.  Elevated liver enzymes  -This was telephone visit, clearly cannot do a good physical exam  -Migraines well controlled on Topamax, continue 100 mg at bedtime  -EMG in Nov 2020 did not show clear evidence of an underlying myopathic disorder (have held dystrophin test for the time being)  -Extensive laboratory evaluation in November 2020 revealed elevated sed rate 113, CK normal 337; ANA, angiotensin-converting enzyme, B burgdorferi were unremarkable; sed rate in December 2020 improved 65, RF factor negative, B12 normal; elevated liver enzymes in June 2021 AST 120, ALT 64, alk phos 139 hospitalized with alcoholic hepatitis  -Completed 5 weeks of PT/OT that I  ordered, was beneficial  -His neuropathy may explain some of his gait instability issues  Follow Up Instructions: Migraines are well controlled, needs an office visit with Dr. Jannifer Franklin in 3  months for physical exam, continued surveillance of possible myopathic disorder   I discussed the assessment and treatment plan with the patient. The patient was provided an opportunity to ask questions and all were answered. The patient agreed with the plan and demonstrated an understanding of the instructions.   The patient was advised to call back or seek an in-person evaluation if the symptoms worsen or if the condition fails to improve as anticipated.  I spent 15 minutes of face-to-face and non-face-to-face time with patient.  This included previsit chart review, lab review, study review, order entry, electronic health record documentation, patient education.  Evangeline Dakin, DNP  [facility] [address] [place], [place] [address] [phone]

## 2020-08-06 NOTE — Telephone Encounter (Signed)
Called and spoke to patient regarding office follow, made patient appointment on 10/28/20 @ 1130.  Patient accepted appointment and acknowledged arrival time for check in is 1100.  Had no questions and expressed appreciation.

## 2020-08-21 ENCOUNTER — Other Ambulatory Visit: Payer: Self-pay | Admitting: Gastroenterology

## 2020-08-21 DIAGNOSIS — D72829 Elevated white blood cell count, unspecified: Secondary | ICD-10-CM

## 2020-08-27 ENCOUNTER — Encounter: Payer: Self-pay | Admitting: Hematology and Oncology

## 2020-08-27 ENCOUNTER — Telehealth: Payer: Self-pay | Admitting: Hematology and Oncology

## 2020-08-27 NOTE — Telephone Encounter (Signed)
Received a new hem referral from Dr. Alessandra Bevels for abnl lab test. Pt has been cld and scheduled to see Dr. Lindi Adie 12/6 at 2:15pm. Pt aware to arrive 30 minutes early. Letter mailed.

## 2020-09-10 ENCOUNTER — Ambulatory Visit
Admission: RE | Admit: 2020-09-10 | Discharge: 2020-09-10 | Disposition: A | Discharge status: Home / Self Care | Payer: Medicare HMO | Source: Ambulatory Visit | Attending: Gastroenterology | Admitting: Gastroenterology

## 2020-09-10 DIAGNOSIS — D72829 Elevated white blood cell count, unspecified: Secondary | ICD-10-CM

## 2020-09-10 MED ORDER — IOPAMIDOL (ISOVUE-300) INJECTION 61%
100.0000 mL | Freq: Once | INTRAVENOUS | Status: AC | PRN
  Administered 2020-09-10: 100 mL via INTRAVENOUS

## 2020-09-13 NOTE — Progress Notes (Signed)
[place] NOTE  Patient Care Team: [name], MD as PCP - General (Internal Medicine) Bernita Raisin, MD as Consulting Physician  CHIEF COMPLAINTS/PURPOSE OF CONSULTATION:  Newly diagnosed macrocytosis  HISTORY OF PRESENTING ILLNESS:  [name] 34 y.o. male is here because of recent diagnosis of macrocytosis. He is referred by Dr. Alessandra Bevels. Labs on 07/16/20 showed WBC 11.2, Hg 10.5, HCT 31.4, platelets 205, creatinine 1.11, AST 45, ALT 22, ALP 222, and on 08/21/20 showed WBC 12.6, Hg 12.3, HCT 38.6, platelets 232. He is followed by [facility] for steatohepatitis. He was diagnosed with COVID-19 in 10/2019. He presents to the clinic today for initial evaluation.   I reviewed his records extensively and collaborated the history with the patient.  MEDICAL HISTORY:  Past Medical History:  Diagnosis Date  . Bipolar 1 disorder ([place])   . Depression   . Dizziness and giddiness 02/01/2016  . Herpes genitalia   . HIV disease ([place])   . Hypertension   . Migraine headache 02/01/2016  . Peripheral neuropathy 10/01/2019  . PTSD (post-traumatic stress disorder)   . Schizoaffective disorder ([place])   . Seizures ([place])     SURGICAL HISTORY: Past Surgical History:  Procedure Laterality Date  . BACK SURGERY    . HAND SURGERY      SOCIAL HISTORY: Social History   Socioeconomic History  . Marital status: Single    Spouse name: Not on file  . Number of children: Not on file  . Years of education: Not on file  . Highest education level: Not on file  Occupational History  . Occupation: [employer]  Tobacco Use  . Smoking status: Current Every Day Smoker    Packs/day: 1.00    Types: Cigarettes  . Smokeless tobacco: Never Used  Vaping Use  . Vaping Use: Never used  Substance and Sexual Activity  . Alcohol use: Yes    Alcohol/week: 14.0 standard drinks    Types: 14 Cans of beer per week    Comment: 5-6 40oz per day  . Drug use: No  . Sexual  activity: Not Currently    Birth control/protection: Condom  Other Topics Concern  . Not on file  Social History Narrative   Lives at home alone   Right-handed   Drinks 2-3 sodas per day   Social Determinants of Health   Financial Resource Strain:   . Difficulty of Paying Living Expenses: Not on file  Food Insecurity:   . Worried About Charity fundraiser in the Last Year: Not on file  . Ran Out of Food in the Last Year: Not on file  Transportation Needs:   . Lack of Transportation (Medical): Not on file  . Lack of Transportation (Non-Medical): Not on file  Physical Activity:   . Days of Exercise per Week: Not on file  . Minutes of Exercise per Session: Not on file  Stress:   . Feeling of Stress : Not on file  Social Connections:   . Frequency of Communication with Friends and Family: Not on file  . Frequency of Social Gatherings with Friends and Family: Not on file  . Attends Religious Services: Not on file  . Active Member of Clubs or Organizations: Not on file  . Attends Archivist Meetings: Not on file  . Marital Status: Not on file  Intimate Partner Violence:   . Fear of Current or Ex-Partner: Not on file  . Emotionally Abused: Not on file  . Physically Abused:  Not on file  . Sexually Abused: Not on file    FAMILY HISTORY: Family History  Problem Relation Age of Onset  . Alcohol abuse Mother   . Schizophrenia Father   . Depression Father   . Alcohol abuse Father   . Alcohol abuse Paternal Uncle   . Alcohol abuse Paternal Uncle     ALLERGIES:  is allergic to dapsone and primaquine phosphate.  MEDICATIONS:  Current Outpatient Medications  Medication Sig Dispense Refill  . albuterol (PROVENTIL HFA;VENTOLIN HFA) 108 (90 BASE) MCG/ACT inhaler Inhale 2 puffs into the lungs every 6 (six) hours as needed for wheezing or shortness of breath.    . alprazolam (XANAX) 2 MG tablet Take 2 mg by mouth 3 (three) times daily.    [name] amoxicillin-clavulanate  (AUGMENTIN) 875-125 MG tablet Take 1 tablet by mouth every 12 (twelve) hours. 14 tablet 0  . benzonatate (TESSALON) 100 MG capsule Take 200 mg by mouth every 8 (eight) hours as needed for cough.    . bictegravir-emtricitabine-tenofovir AF (BIKTARVY) 50-200-25 MG TABS tablet Take 1 tablet by mouth daily.     . cetirizine (ZYRTEC) 10 MG tablet Take 1 tablet (10 mg total) by mouth daily. (Patient not taking: Reported on 03/14/2020)    . famotidine (PEPCID) 40 MG tablet Take 40 mg by mouth daily.    [name] FLUoxetine (PROZAC) 40 MG capsule Take 40 mg by mouth daily.    . folic acid (FOLVITE) 1 MG tablet Take 1 mg by mouth daily.    . furosemide (LASIX) 40 MG tablet Take 40 mg by mouth daily.     . haloperidol (HALDOL) 5 MG tablet Take 5 mg by mouth daily.     . hydrOXYzine (ATARAX/VISTARIL) 50 MG tablet Take 1 tablet (50 mg total) by mouth every 6 (six) hours as needed for anxiety. 30 tablet 0  . linaclotide (LINZESS) 145 MCG CAPS capsule Take 145 mcg by mouth daily as needed (constipation).    [name] lurasidone 120 MG TABS Take 1 tablet (120 mg total) by mouth daily. 30 tablet 0  . meclizine (ANTIVERT) 25 MG tablet Take 25 mg by mouth every 8 (eight) hours as needed for dizziness.     . ondansetron (ZOFRAN) 8 MG tablet Take 8 mg by mouth every 8 (eight) hours as needed for nausea/vomiting.    . pantoprazole (PROTONIX) 40 MG tablet Take 40 mg by mouth every morning.    . promethazine (PHENERGAN) 12.5 MG tablet Take 12.5 mg by mouth 2 (two) times daily as needed for nausea or vomiting.     . thiamine (VITAMIN B-1) 100 MG tablet Take 100 mg by mouth daily.     [name] topiramate (TOPAMAX) 100 MG tablet Take 1 tablet (100 mg total) by mouth at bedtime. 90 tablet 1  . traZODone (DESYREL) 300 MG tablet Take 1 tablet (300 mg total) by mouth at bedtime as needed for sleep. (Patient taking differently: Take 300 mg by mouth at bedtime. ) 30 tablet 0  . valACYclovir (VALTREX) 1000 MG tablet Take 1 tablet (1,000 mg total) by  mouth daily.    . Vitamin D, Ergocalciferol, (DRISDOL) 1.25 MG (50000 UNIT) CAPS capsule Take 50,000 Units by mouth once a week.    . zolpidem (AMBIEN) 10 MG tablet Take 10 mg by mouth at bedtime.     No current facility-administered medications for this visit.    REVIEW OF SYSTEMS:   Constitutional: Denies fevers, chills or abnormal night sweats   All other  systems were reviewed with the patient and are negative.  PHYSICAL EXAMINATION: ECOG PERFORMANCE STATUS: 1 - Symptomatic but completely ambulatory  Vitals:   09/14/20 1430  BP: 109/69  Pulse: 100  Resp: 18  Temp: 97.9 F (36.6 C)  SpO2: 100%   Filed Weights   09/14/20 1430  Weight: 245 lb 4.8 oz (111.3 kg)    LABORATORY DATA:  I have reviewed the data as listed Lab Results  Component Value Date   WBC 14.4 (H) 03/20/2020   HGB 8.8 (L) 03/20/2020   HCT 28.9 (L) 03/20/2020   MCV 113.8 (H) 03/20/2020   PLT 175 03/20/2020   Lab Results  Component Value Date   NA 133 (L) 03/20/2020   K 3.2 (L) 03/20/2020   CL 103 03/20/2020   CO2 21 (L) 03/20/2020    RADIOGRAPHIC STUDIES: I have personally reviewed the radiological reports and agreed with the findings in the report.  ASSESSMENT AND PLAN:  Macrocytic anemia Multifactorial: markedly worse since Jan 2021 Normal B 12 and Folate MCV: 113.8, Hb 8.8, rest of CBC is normal  Differential diagnosis: 1.  Medication induced: Related to antiretroviral medications 2. infection related 3.  We will rule out hemolysis with LDH and reticulocyte count 4.  Myelodysplastic syndrome  I discussed with the patient that we will perform blood work today and will call him tomorrow with the results. If necessary we may have to pursue a bone marrow biopsy. We can watch the trend by rechecking him back in 3 months with labs.   All questions were answered. The patient knows to call the clinic with any problems, questions or concerns.   Rulon Eisenmenger, MD, MPH 09/14/2020    I,  Molly Dorshimer, am acting as scribe for Nicholas Lose, MD.  I have reviewed the above documentation for accuracy and completeness, and I agree with the above.

## 2020-09-14 ENCOUNTER — Inpatient Hospital Stay: Payer: Medicare HMO

## 2020-09-14 ENCOUNTER — Inpatient Hospital Stay: Payer: Medicare HMO | Attending: Hematology and Oncology | Admitting: Hematology and Oncology

## 2020-09-14 ENCOUNTER — Telehealth: Payer: Self-pay | Admitting: Hematology and Oncology

## 2020-09-14 ENCOUNTER — Other Ambulatory Visit: Payer: Self-pay

## 2020-09-14 DIAGNOSIS — Z811 Family history of alcohol abuse and dependence: Secondary | ICD-10-CM | POA: Insufficient documentation

## 2020-09-14 DIAGNOSIS — D539 Nutritional anemia, unspecified: Secondary | ICD-10-CM | POA: Insufficient documentation

## 2020-09-14 DIAGNOSIS — Z79899 Other long term (current) drug therapy: Secondary | ICD-10-CM | POA: Insufficient documentation

## 2020-09-14 DIAGNOSIS — Z7289 Other problems related to lifestyle: Secondary | ICD-10-CM | POA: Diagnosis not present

## 2020-09-14 DIAGNOSIS — K7581 Nonalcoholic steatohepatitis (NASH): Secondary | ICD-10-CM | POA: Insufficient documentation

## 2020-09-14 DIAGNOSIS — F17210 Nicotine dependence, cigarettes, uncomplicated: Secondary | ICD-10-CM | POA: Insufficient documentation

## 2020-09-14 DIAGNOSIS — Z818 Family history of other mental and behavioral disorders: Secondary | ICD-10-CM | POA: Diagnosis not present

## 2020-09-14 DIAGNOSIS — D7589 Other specified diseases of blood and blood-forming organs: Secondary | ICD-10-CM | POA: Insufficient documentation

## 2020-09-14 DIAGNOSIS — D469 Myelodysplastic syndrome, unspecified: Secondary | ICD-10-CM | POA: Insufficient documentation

## 2020-09-14 LAB — CMP (CANCER CENTER ONLY)
ALT: 9 U/L (ref 0–44)
AST: 17 U/L (ref 15–41)
Albumin: 2.8 g/dL — ABNORMAL LOW (ref 3.5–5.0)
Alkaline Phosphatase: 176 U/L — ABNORMAL HIGH (ref 38–126)
Anion gap: 10 (ref 5–15)
BUN: <4 mg/dL — ABNORMAL LOW (ref 6–20)
CO2: 26 mmol/L (ref 22–32)
Calcium: 9.7 mg/dL (ref 8.9–10.3)
Chloride: 99 mmol/L (ref 98–111)
Creatinine: 1.08 mg/dL (ref 0.61–1.24)
GFR, Estimated: >60 mL/min (ref >60)
Glucose, Bld: 107 mg/dL — ABNORMAL HIGH (ref 70–99)
Potassium: 3.7 mmol/L (ref 3.5–5.1)
Sodium: 135 mmol/L (ref 135–145)
Total Bilirubin: 0.3 mg/dL (ref 0.3–1.2)
Total Protein: 8.7 g/dL — ABNORMAL HIGH (ref 6.5–8.1)

## 2020-09-14 LAB — CBC WITH DIFFERENTIAL (CANCER CENTER ONLY)
Abs Immature Granulocytes: 0.29 10*3/uL — ABNORMAL HIGH (ref 0.00–0.07)
Basophils Absolute: 0.1 10*3/uL (ref 0.0–0.1)
Basophils Relative: 1 %
Eosinophils Absolute: 0.1 10*3/uL (ref 0.0–0.5)
Eosinophils Relative: 0 %
HCT: 39.3 % (ref 39.0–52.0)
Hemoglobin: 12.9 g/dL — ABNORMAL LOW (ref 13.0–17.0)
Immature Granulocytes: 2 %
Lymphocytes Relative: 16 %
Lymphs Abs: 2.8 10*3/uL (ref 0.7–4.0)
MCH: 35 pg — ABNORMAL HIGH (ref 26.0–34.0)
MCHC: 32.8 g/dL (ref 30.0–36.0)
MCV: 106.5 fL — ABNORMAL HIGH (ref 80.0–100.0)
Monocytes Absolute: 1.3 10*3/uL — ABNORMAL HIGH (ref 0.1–1.0)
Monocytes Relative: 8 %
Neutro Abs: 12.5 10*3/uL — ABNORMAL HIGH (ref 1.7–7.7)
Neutrophils Relative %: 73 %
Platelet Count: 249 10*3/uL (ref 150–400)
RBC: 3.69 MIL/uL — ABNORMAL LOW (ref 4.22–5.81)
RDW: 12.9 % (ref 11.5–15.5)
WBC Count: 17.1 10*3/uL — ABNORMAL HIGH (ref 4.0–10.5)
nRBC: 0 % (ref 0.0–0.2)

## 2020-09-14 LAB — RETICULOCYTES
Immature Retic Fract: 16.8 % — ABNORMAL HIGH (ref 2.3–15.9)
RBC.: 3.72 MIL/uL — ABNORMAL LOW (ref 4.22–5.81)
Retic Count, Absolute: 56.9 10*3/uL (ref 19.0–186.0)
Retic Ct Pct: 1.5 % (ref 0.4–3.1)

## 2020-09-14 LAB — VITAMIN B12: Vitamin B-12: 460 pg/mL (ref 180–914)

## 2020-09-14 LAB — FOLATE: Folate: 8.8 ng/mL (ref >5.9)

## 2020-09-14 LAB — LACTATE DEHYDROGENASE: LDH: 138 U/L (ref 98–192)

## 2020-09-14 NOTE — Telephone Encounter (Signed)
CBC Latest Ref Rng & Units 09/14/2020 03/20/2020 03/19/2020  WBC 4.0 - 10.5 K/uL 17.1(H) 14.4(H) 12.4(H)  Hemoglobin 13.0 - 17.0 g/dL 12.9(L) 8.8(L) 8.3(L)  Hematocrit 39 - 52 % 39.3 28.9(L) 27.1(L)  Platelets 150 - 400 K/uL 249 175 164    I reviewed the lab work with the patient. His hemoglobin is improved to 12.9 and his MCV has also improved. Therefore the increased MCV can be attributed to antiretroviral therapy. There is no indication for bone marrow biopsy at this time. If in the future he becomes pancytopenic we will perform a bone marrow biopsy. Return to clinic on an as-needed basis.

## 2020-09-14 NOTE — Assessment & Plan Note (Signed)
Multifactorial: markedly worse since Jan 2021 Normal B 12 and Folate MCV: 113.8, Hb 8.8

## 2020-09-14 NOTE — Telephone Encounter (Signed)
Scheduled appts per 12/6 los. Gave pt a print out of AVS.

## 2020-09-21 ENCOUNTER — Telehealth: Payer: Self-pay | Admitting: Hematology and Oncology

## 2020-09-21 NOTE — Telephone Encounter (Signed)
Scheduled per 12/8 staff msg. Called and spoke with pt, confirmed 12/21 appt

## 2020-09-28 NOTE — Progress Notes (Signed)
Patient Care Team: Drew Ebbs, MD as PCP - General (Internal Medicine) Drew Raisin, MD as Consulting Physician  DIAGNOSIS:    ICD-10-CM   1. Lymphadenopathy  R59.1     CHIEF COMPLIANT: Follow-up of macrocytosis and lymphadenopathy  INTERVAL HISTORY: [name] is a 34 y.o. with above-mentioned history of macrocytosis. he presents to the clinic today for follow-up.  He was noted to have lymphadenopathy in the CT scan and he was asked to follow-up with [place].  He has external iliac and inguinal lymphadenopathy that measures 1.4 cm in size.  These have slightly increased in size compared to June 2020.  ALLERGIES:  is allergic to dapsone and primaquine phosphate.  MEDICATIONS:  Current Outpatient Medications  Medication Sig Dispense Refill  . albuterol (PROVENTIL HFA;VENTOLIN HFA) 108 (90 BASE) MCG/ACT inhaler Inhale 2 puffs into the lungs every 6 (six) hours as needed for wheezing or shortness of breath.    . alprazolam (XANAX) 2 MG tablet Take 2 mg by mouth 3 (three) times daily.    [name] amoxicillin-clavulanate (AUGMENTIN) 875-125 MG tablet Take 1 tablet by mouth every 12 (twelve) hours. 14 tablet 0  . benzonatate (TESSALON) 100 MG capsule Take 200 mg by mouth every 8 (eight) hours as needed for cough.    . bictegravir-emtricitabine-tenofovir AF (BIKTARVY) 50-200-25 MG TABS tablet Take 1 tablet by mouth daily.     . cetirizine (ZYRTEC) 10 MG tablet Take 1 tablet (10 mg total) by mouth daily. (Patient not taking: Reported on 03/14/2020)    . famotidine (PEPCID) 40 MG tablet Take 40 mg by mouth daily.    [name] FLUoxetine (PROZAC) 40 MG capsule Take 40 mg by mouth daily.    . folic acid (FOLVITE) 1 MG tablet Take 1 mg by mouth daily.    . furosemide (LASIX) 40 MG tablet Take 40 mg by mouth daily.     . haloperidol (HALDOL) 5 MG tablet Take 5 mg by mouth daily.     . hydrOXYzine (ATARAX/VISTARIL) 50 MG tablet Take 1 tablet (50 mg total) by mouth every 6 (six) hours as needed for anxiety.  30 tablet 0  . linaclotide (LINZESS) 145 MCG CAPS capsule Take 145 mcg by mouth daily as needed (constipation).    [name] lurasidone 120 MG TABS Take 1 tablet (120 mg total) by mouth daily. 30 tablet 0  . meclizine (ANTIVERT) 25 MG tablet Take 25 mg by mouth every 8 (eight) hours as needed for dizziness.     . ondansetron (ZOFRAN) 8 MG tablet Take 8 mg by mouth every 8 (eight) hours as needed for nausea/vomiting.    . pantoprazole (PROTONIX) 40 MG tablet Take 40 mg by mouth every morning.    . promethazine (PHENERGAN) 12.5 MG tablet Take 12.5 mg by mouth 2 (two) times daily as needed for nausea or vomiting.     . thiamine (VITAMIN B-1) 100 MG tablet Take 100 mg by mouth daily.     [name] topiramate (TOPAMAX) 100 MG tablet Take 1 tablet (100 mg total) by mouth at bedtime. 90 tablet 1  . traZODone (DESYREL) 300 MG tablet Take 1 tablet (300 mg total) by mouth at bedtime as needed for sleep. (Patient taking differently: Take 300 mg by mouth at bedtime. ) 30 tablet 0  . valACYclovir (VALTREX) 1000 MG tablet Take 1 tablet (1,000 mg total) by mouth daily.    . Vitamin D, Ergocalciferol, (DRISDOL) 1.25 MG (50000 UNIT) CAPS capsule Take 50,000 Units by mouth once a week.    [name]  zolpidem (AMBIEN) 10 MG tablet Take 10 mg by mouth at bedtime.     No current facility-administered medications for this visit.    PHYSICAL EXAMINATION: ECOG PERFORMANCE STATUS: 1 - Symptomatic but completely ambulatory  Vitals:   09/29/20 1558  BP: 108/74  Pulse: (!) 101  Resp: 20  Temp: 98.9 F (37.2 C)  SpO2: 100%   Filed Weights   09/29/20 1558  Weight: 253 lb 9.6 oz (115 kg)    LABORATORY DATA:  I have reviewed the data as listed CMP Latest Ref Rng & Units 09/14/2020 03/20/2020 03/19/2020  Glucose 70 - 99 mg/dL 107(H) 97 106(H)  BUN 6 - 20 mg/dL <4(L) 8 8  Creatinine 0.61 - 1.24 mg/dL 1.08 0.91 0.79  Sodium 135 - 145 mmol/L 135 133(L) 135  Potassium 3.5 - 5.1 mmol/L 3.7 3.2(L) 3.3(L)  Chloride 98 - 111 mmol/L 99 103 105   CO2 22 - 32 mmol/L 26 21(L) 25  Calcium 8.9 - 10.3 mg/dL 9.7 9.3 9.2  Total Protein 6.5 - 8.1 g/dL 8.7(H) 7.7 7.1  Total Bilirubin 0.3 - 1.2 mg/dL 0.3 7.0(H) 8.5(H)  Alkaline Phos 38 - 126 U/L 176(H) 139(H) 131(H)  AST 15 - 41 U/L 17 120(H) 137(H)  ALT 0 - 44 U/L 9 64(H) 66(H)    Lab Results  Component Value Date   WBC 17.1 (H) 09/14/2020   HGB 12.9 (L) 09/14/2020   HCT 39.3 09/14/2020   MCV 106.5 (H) 09/14/2020   PLT 249 09/14/2020   NEUTROABS 12.5 (H) 09/14/2020    ASSESSMENT & PLAN:  Lymphadenopathy Inguinal and external iliac lymphadenopathy detected on CT scan done in December. I discussed with him that the cause of lymphadenopathy is cellulitis in his groin area.  She she has multiple boils and frequent abscesses. I discussed that he needs to make an appointment to see his infectious disease specialist for that. This lymphadenopathy is infectious and and not related to any underlying hematological malignancy. The infection is the primary reason for his leukocytosis as well.    No orders of the defined types were placed in this encounter.  The patient has a good understanding of the overall plan. he agrees with it. he will call with any problems that may develop before the next visit here.  Total time spent: 20 mins including face to face time and time spent for planning, charting and coordination of care  Nicholas Lose, MD 09/29/2020  I, Cloyde Reams Dorshimer, am acting as scribe for Dr. Nicholas Lose.  I have reviewed the above documentation for accuracy and completeness, and I agree with the above.

## 2020-09-29 ENCOUNTER — Other Ambulatory Visit: Payer: Self-pay

## 2020-09-29 ENCOUNTER — Inpatient Hospital Stay (HOSPITAL_BASED_OUTPATIENT_CLINIC_OR_DEPARTMENT_OTHER): Payer: Medicare HMO | Admitting: Hematology and Oncology

## 2020-09-29 DIAGNOSIS — R591 Generalized enlarged lymph nodes: Secondary | ICD-10-CM | POA: Diagnosis not present

## 2020-09-29 DIAGNOSIS — D7589 Other specified diseases of blood and blood-forming organs: Secondary | ICD-10-CM | POA: Diagnosis not present

## 2020-09-29 NOTE — Assessment & Plan Note (Addendum)
Inguinal and external iliac lymphadenopathy detected on CT scan done in December. I discussed with him that the cause of lymphadenopathy is cellulitis in his groin area.  She she has multiple boils and frequent abscesses. I discussed that he needs to make an appointment to see his infectious disease specialist for that. This lymphadenopathy is infectious and and not related to any underlying hematological malignancy. The infection is the primary reason for his leukocytosis as well.

## 2020-10-01 ENCOUNTER — Telehealth: Payer: Self-pay | Admitting: Hematology and Oncology

## 2020-10-01 NOTE — Telephone Encounter (Signed)
No 12/21 los, no changes made to pt schedule

## 2020-10-23 ENCOUNTER — Other Ambulatory Visit: Payer: Self-pay

## 2020-10-23 ENCOUNTER — Ambulatory Visit (HOSPITAL_COMMUNITY)
Admission: EM | Admit: 2020-10-23 | Discharge: 2020-10-23 | Disposition: A | Discharge status: Home / Self Care | Payer: Medicare HMO | Attending: Student | Admitting: Student

## 2020-10-23 ENCOUNTER — Encounter (HOSPITAL_COMMUNITY): Payer: Self-pay | Admitting: *Deleted

## 2020-10-23 DIAGNOSIS — R220 Localized swelling, mass and lump, head: Secondary | ICD-10-CM

## 2020-10-23 MED ORDER — PREDNISONE 20 MG PO TABS
20.0000 mg | ORAL_TABLET | Freq: Every day | ORAL | 0 refills | Status: AC
Start: 2020-10-23 — End: 2020-10-28

## 2020-10-23 MED ORDER — AMOXICILLIN-POT CLAVULANATE 875-125 MG PO TABS
1.0000 | ORAL_TABLET | Freq: Two times a day (BID) | ORAL | 0 refills | Status: DC
Start: 2020-10-23 — End: 2021-05-19

## 2020-10-23 NOTE — ED Triage Notes (Signed)
Pt reports upper lip started to swell on Monday. Pt is speaking in full sentences and no resp. Distress noted.

## 2020-10-23 NOTE — Discharge Instructions (Addendum)
-  Start the antibiotic (Augmentin). Take this two times a day for 7 days -Also start the steroid (Prednisone). Take one pill a day for 5 days. -Continue using ibuprofen for pain.  -Please follow-up with this urgent care or your primary care provider in 2-3 days, or sooner if symptoms worsen.  -Head straight to the ED if- your face gets even more swollen, if you're having trouble breathing, if it feels like your throat is closing, if you feel short of breath, etc.

## 2020-10-23 NOTE — ED Provider Notes (Signed)
[facility]    CSN: [id number] Arrival date & time: 10/23/20  1436      History   Chief Complaint Chief Complaint  Patient presents with  . Oral Swelling    HPI [name] is a 35 y.o. male presenting with swelling to the upper lip since Monday 10/19/2020. History of bipolar 1 disorder, depression, HIV, hypertension, migraine, PTSD, schizoaffective disorder, seizures. Pt presenting with swelling of upper lip x5 days, painful and getting worse. Pain is adequately controlled on ibuprofen. Denies shortness of breath, throat pain, difficulty breathing, fevers/chills. States he was treated for "boils on inner thighs" with bactrim 2 weeks ago, with resolution of boils. Stopped this antibiotic on Monday. States he was diagnosed with similar lip swelling 1 month ago and was treated with prednisone and antibiotic at that time, with resolution of symptoms. Tolerated the prednisone well. He is not a diabetic. Denies shortness of breath, difficulty breathing, etc.   HPI  Past Medical History:  Diagnosis Date  . Bipolar 1 disorder ([place])   . Depression   . Dizziness and giddiness 02/01/2016  . Herpes genitalia   . HIV disease ([place])   . Hypertension   . Migraine headache 02/01/2016  . Peripheral neuropathy 10/01/2019  . PTSD (post-traumatic stress disorder)   . Schizoaffective disorder ([place])   . Seizures [facility])     Patient Active Problem List   Diagnosis Date Noted  . Lymphadenopathy 09/29/2020  . Anemia   . Upper urinary tract infection   . Sepsis secondary to UTI ([place]) 03/14/2020  . Cellulitis of groin 03/14/2020  . Elevated LFTs 03/14/2020  . HIV disease ([place])   . Acute encephalopathy   . Hyponatremia   . Macrocytic anemia   . Thrombocytopenia ([place])   . Prolonged QT interval   . Acute respiratory failure due to COVID-19 ([place]) 10/27/2019  . GERD (gastroesophageal reflux disease) 10/27/2019  . Hypokalemia 10/27/2019  . Peripheral neuropathy 10/01/2019  . PTSD  (post-traumatic stress disorder) 07/23/2018  . Dizziness and giddiness 02/01/2016  . Migraine headache 02/01/2016  . Schizoaffective disorder, depressive type ([place]) 05/13/2015  . Severe alcohol dependence ([place]) 05/13/2015  . Suicidal ideation 01/12/2014    Past Surgical History:  Procedure Laterality Date  . BACK SURGERY    . HAND SURGERY         Home Medications    Prior to Admission medications   Medication Sig Start Date End Date Taking? Authorizing Provider  amoxicillin-clavulanate (AUGMENTIN) 875-125 MG tablet Take 1 tablet by mouth every 12 (twelve) hours. 10/23/20  Yes Hazel Sams, PA-C  predniSONE (DELTASONE) 20 MG tablet Take 1 tablet (20 mg total) by mouth daily for 5 days. 10/23/20 10/28/20 Yes Hazel Sams, PA-C  albuterol (PROVENTIL HFA;VENTOLIN HFA) 108 (90 BASE) MCG/ACT inhaler Inhale 2 puffs into the lungs every 6 (six) hours as needed for wheezing or shortness of breath.    [provider]  alprazolam Duanne Moron) 2 MG tablet Take 2 mg by mouth 3 (three) times daily.    [provider]  benzonatate (TESSALON) 100 MG capsule Take 200 mg by mouth every 8 (eight) hours as needed for cough. 10/23/19   [provider]  bictegravir-emtricitabine-tenofovir AF (BIKTARVY) 50-200-25 MG TABS tablet Take 1 tablet by mouth daily.  02/24/17   [provider]  cetirizine (ZYRTEC) 10 MG tablet Take 1 tablet (10 mg total) by mouth daily. Patient not taking: Reported on 03/14/2020 05/15/15   Niel Hummer, NP  famotidine (  PEPCID) 40 MG tablet Take 40 mg by mouth daily.    [provider]  FLUoxetine (PROZAC) 40 MG capsule Take 40 mg by mouth daily.    [provider]  folic acid (FOLVITE) 1 MG tablet Take 1 mg by mouth daily. 10/03/19   [provider]  furosemide (LASIX) 40 MG tablet Take 40 mg by mouth daily.  02/27/20   [provider]  haloperidol (HALDOL) 5 MG tablet Take 5 mg by mouth daily.  06/19/19   [provider]  hydrOXYzine (ATARAX/VISTARIL) 50 MG tablet Take 1 tablet (50 mg total) by mouth every 6 (six) hours as needed for anxiety. 07/25/18   Pennelope Bracken, MD  linaclotide (LINZESS) 145 MCG CAPS capsule Take 145 mcg by mouth daily as needed (constipation).    [provider]  lurasidone 120 MG TABS Take 1 tablet (120 mg total) by mouth daily. 05/15/15   Niel Hummer, NP  meclizine (ANTIVERT) 25 MG tablet Take 25 mg by mouth every 8 (eight) hours as needed for dizziness.  03/02/20   [provider]  ondansetron (ZOFRAN) 8 MG tablet Take 8 mg by mouth every 8 (eight) hours as needed for nausea/vomiting. 10/03/19   [provider]  pantoprazole (PROTONIX) 40 MG tablet Take 40 mg by mouth every morning. 03/02/20   [provider]  promethazine (PHENERGAN) 12.5 MG tablet Take 12.5 mg by mouth 2 (two) times daily as needed for nausea or vomiting.  03/02/20   [provider]  thiamine (VITAMIN B-1) 100 MG tablet Take 100 mg by mouth daily.  01/31/19   [provider]  topiramate (TOPAMAX) 100 MG tablet Take 1 tablet (100 mg total) by mouth at bedtime. 08/06/20   Suzzanne Cloud, NP  traZODone (DESYREL) 300 MG tablet Take 1 tablet (300 mg total) by mouth at bedtime as needed for sleep. Patient taking differently: Take 300 mg by mouth at bedtime.  05/15/15   Niel Hummer, NP  valACYclovir (VALTREX) 1000 MG tablet Take 1 tablet (1,000 mg total) by mouth daily. 05/15/15   Niel Hummer, NP  Vitamin D, Ergocalciferol, (DRISDOL) 1.25 MG (50000 UNIT) CAPS capsule Take 50,000 Units by mouth once a week. 10/03/19   [provider]  zolpidem (AMBIEN) 10 MG tablet Take 10 mg by mouth at bedtime. 09/06/19   [provider]    Family History Family History  Problem Relation Age of Onset  . Alcohol abuse Mother   . Schizophrenia Father   . Depression Father   . Alcohol abuse Father   . Alcohol abuse Paternal Uncle   . Alcohol  abuse Paternal Uncle     Social History Social History   Tobacco Use  . Smoking status: Current Every Day Smoker    Packs/day: 1.00    Types: Cigarettes  . Smokeless tobacco: Never Used  Vaping Use  . Vaping Use: Never used  Substance Use Topics  . Alcohol use: Yes    Alcohol/week: 14.0 standard drinks    Types: 14 Cans of beer per week    Comment: 5-6 40oz per day  . Drug use: No     Allergies   Dapsone and Primaquine phosphate   Review of Systems Review of Systems  Constitutional: Negative for chills and fever.  Skin:       Lip swelling and pain   All other systems reviewed and are negative.    Physical Exam Triage Vital Signs ED Triage Vitals  Enc Vitals Group     BP 10/23/20 1557 (!) 102/56     Pulse Rate 10/23/20 1557 97     Resp 10/23/20 1557 16     Temp 10/23/20 1557 98.2 F (36.8 C)     Temp Source 10/23/20 1557 Oral     SpO2 10/23/20 1557 98 %     Weight --      Height --      Head Circumference --      Peak Flow --      Pain Score 10/23/20 1554 10     Pain Loc --      Pain Edu? --      Excl. in [place]? --    No data found.  Updated Vital Signs BP (!) 102/56 (BP Location: Left Arm)   Pulse 97   Temp 98.2 F (36.8 C) (Oral)   Resp 16   SpO2 98%   Visual Acuity Right Eye Distance:   Left Eye Distance:   Bilateral Distance:    Right Eye Near:   Left Eye Near:    Bilateral Near:     Physical Exam Vitals reviewed.  Constitutional:      Appearance: Normal appearance.  HENT:     Mouth/Throat:     Tongue: No lesions. Tongue does not deviate from midline.     Palate: No mass and lesions.     Pharynx: Oropharynx is clear. Uvula midline. No pharyngeal swelling, oropharyngeal exudate, posterior oropharyngeal erythema or uvula swelling.     Tonsils: No tonsillar exudate or tonsillar abscesses. 0 on the right. 0 on the left.     Comments: Upper left lip with swelling and tenderness to palpation. No pharyngeal edema.  Cardiovascular:      Rate and Rhythm: Normal rate.     Heart sounds: Normal heart sounds.  Pulmonary:     Effort: Pulmonary effort is normal.     Breath sounds: Normal breath sounds.  Neurological:     General: No focal deficit present.     Mental Status: He is alert and oriented to person, place, and time.  Psychiatric:        Attention and Perception: He is inattentive.        Mood and Affect: Affect is blunt and flat.        Behavior: Behavior normal.        Thought Content: Thought content normal.        Judgment: Judgment normal.      UC Treatments / Results  Labs (all labs ordered are listed, but only abnormal results are displayed) Labs Reviewed - No data to display  EKG   Radiology No results found.  Procedures Procedures (including critical care time)  Medications Ordered in UC Medications - No data to display  Initial Impression / Assessment and Plan / UC Course  I have reviewed the triage vital signs and the nursing notes.  Pertinent labs & imaging results that were available during my care of the patient were reviewed by me and considered in my medical decision making (see chart for details).     Afebrile nontachycardic nontachypneic, oxygenating well on room air. No wheezes rhonchi or rales. Airway intact.   Prednisone 4m x5 days and augmentin as below. Pt is not diabetic, recent labs and notes reviewed.   Seek IMMEDIATE medical attention if swelling worsens instead of improves; if you have trouble breathing; if throat feels like it's closing up; etc. Follow-up with your PCP in 2-3  days, or seek immediate medical attention sooner if symptoms worsen. Patient verbalizes understanding and agreement multiple times.   Final Clinical Impressions(s) / UC Diagnoses   Final diagnoses:  None     Discharge Instructions     -Start the antibiotic (Augmentin). Take this two times a day for 7 days -Also start the steroid (Prednisone). Take one pill a day for 5 days. -Continue  using ibuprofen for pain.  -Please follow-up with this urgent care or your primary care provider in 2-3 days, or sooner if symptoms worsen.  -Head straight to the ED if- your face gets even more swollen, if you're having trouble breathing, if it feels like your throat is closing, if you feel short of breath, etc.     ED Prescriptions    Medication Sig Dispense Auth. Provider   predniSONE (DELTASONE) 20 MG tablet Take 1 tablet (20 mg total) by mouth daily for 5 days. 5 tablet Hazel Sams, PA-C   amoxicillin-clavulanate (AUGMENTIN) 875-125 MG tablet Take 1 tablet by mouth every 12 (twelve) hours. 14 tablet Hazel Sams, PA-C     PDMP not reviewed this encounter.   Hazel Sams, PA-C 10/23/20 1648

## 2020-10-28 ENCOUNTER — Ambulatory Visit: Payer: Self-pay | Admitting: Neurology

## 2020-10-28 ENCOUNTER — Other Ambulatory Visit: Payer: Self-pay | Admitting: Internal Medicine

## 2020-10-29 LAB — SARS-COV-2 RNA,(COVID-19) QUALITATIVE NAAT: SARS CoV2 RNA: DETECTED — CR

## 2020-11-09 ENCOUNTER — Other Ambulatory Visit: Payer: Self-pay | Admitting: Internal Medicine

## 2020-11-10 LAB — SARS-COV-2 RNA,(COVID-19) QUALITATIVE NAAT: SARS CoV2 RNA: DETECTED — AB

## 2020-11-25 ENCOUNTER — Other Ambulatory Visit: Payer: Self-pay | Admitting: Internal Medicine

## 2020-11-26 LAB — CBC
HCT: 33.3 % — ABNORMAL LOW (ref 38.5–50.0)
Hemoglobin: 11.3 g/dL — ABNORMAL LOW (ref 13.2–17.1)
MCH: 34.7 pg — ABNORMAL HIGH (ref 27.0–33.0)
MCHC: 33.9 g/dL (ref 32.0–36.0)
MCV: 102.1 fL — ABNORMAL HIGH (ref 80.0–100.0)
MPV: 9.2 fL (ref 7.5–12.5)
Platelets: 170 10*3/uL (ref 140–400)
RBC: 3.26 10*6/uL — ABNORMAL LOW (ref 4.20–5.80)
RDW: 14 % (ref 11.0–15.0)
WBC: 14.5 10*3/uL — ABNORMAL HIGH (ref 3.8–10.8)

## 2020-11-26 LAB — COMPLETE METABOLIC PANEL WITH GFR
AG Ratio: 0.6 (calc) — ABNORMAL LOW (ref 1.0–2.5)
ALT: 15 U/L (ref 9–46)
AST: 57 U/L — ABNORMAL HIGH (ref 10–40)
Albumin: 2.8 g/dL — ABNORMAL LOW (ref 3.6–5.1)
Alkaline phosphatase (APISO): 272 U/L — ABNORMAL HIGH (ref 36–130)
BUN/Creatinine Ratio: 2 (calc) — ABNORMAL LOW (ref 6–22)
BUN: 3 mg/dL — ABNORMAL LOW (ref 7–25)
CO2: 22 mmol/L (ref 20–32)
Calcium: 8.6 mg/dL (ref 8.6–10.3)
Chloride: 101 mmol/L (ref 98–110)
Creat: 1.23 mg/dL (ref 0.60–1.35)
GFR, Est African American: 87 mL/min/{1.73_m2} (ref >=60)
GFR, Est Non African American: 75 mL/min/{1.73_m2} (ref >=60)
Globulin: 4.8 g/dL (calc) — ABNORMAL HIGH (ref 1.9–3.7)
Glucose, Bld: 88 mg/dL (ref 65–99)
Potassium: 3.7 mmol/L (ref 3.5–5.3)
Sodium: 133 mmol/L — ABNORMAL LOW (ref 135–146)
Total Bilirubin: 0.4 mg/dL (ref 0.2–1.2)
Total Protein: 7.6 g/dL (ref 6.1–8.1)

## 2020-11-26 LAB — URINE CULTURE
MICRO NUMBER:: 11542024
SPECIMEN QUALITY:: ADEQUATE

## 2020-11-26 LAB — LIPID PANEL
Cholesterol: 133 mg/dL (ref <200)
HDL: 73 mg/dL (ref >=40)
LDL Cholesterol (Calc): 46 mg/dL (calc)
Non-HDL Cholesterol (Calc): 60 mg/dL (calc) (ref <130)
Total CHOL/HDL Ratio: 1.8 (calc) (ref <5.0)
Triglycerides: 67 mg/dL (ref <150)

## 2020-11-26 LAB — TSH: TSH: 1.42 mIU/L (ref 0.40–4.50)

## 2020-11-26 LAB — VITAMIN D 25 HYDROXY (VIT D DEFICIENCY, FRACTURES): Vit D, 25-Hydroxy: 40 ng/mL (ref 30–100)

## 2020-12-03 ENCOUNTER — Emergency Department (HOSPITAL_COMMUNITY)
Admission: EM | Admit: 2020-12-03 | Discharge: 2020-12-03 | Disposition: A | Discharge status: Home / Self Care | Payer: Medicare HMO | Attending: Emergency Medicine | Admitting: Emergency Medicine

## 2020-12-03 ENCOUNTER — Encounter (HOSPITAL_COMMUNITY): Payer: Self-pay

## 2020-12-03 ENCOUNTER — Other Ambulatory Visit: Payer: Self-pay

## 2020-12-03 DIAGNOSIS — R300 Dysuria: Secondary | ICD-10-CM | POA: Insufficient documentation

## 2020-12-03 DIAGNOSIS — I10 Essential (primary) hypertension: Secondary | ICD-10-CM | POA: Insufficient documentation

## 2020-12-03 DIAGNOSIS — K921 Melena: Secondary | ICD-10-CM | POA: Diagnosis not present

## 2020-12-03 DIAGNOSIS — Z21 Asymptomatic human immunodeficiency virus [HIV] infection status: Secondary | ICD-10-CM | POA: Diagnosis not present

## 2020-12-03 DIAGNOSIS — F17210 Nicotine dependence, cigarettes, uncomplicated: Secondary | ICD-10-CM | POA: Insufficient documentation

## 2020-12-03 DIAGNOSIS — R319 Hematuria, unspecified: Secondary | ICD-10-CM | POA: Diagnosis present

## 2020-12-03 DIAGNOSIS — R197 Diarrhea, unspecified: Secondary | ICD-10-CM

## 2020-12-03 LAB — CBC WITH DIFFERENTIAL/PLATELET
Abs Immature Granulocytes: 0.32 10*3/uL — ABNORMAL HIGH (ref 0.00–0.07)
Basophils Absolute: 0.1 10*3/uL (ref 0.0–0.1)
Basophils Relative: 1 %
Eosinophils Absolute: 0.1 10*3/uL (ref 0.0–0.5)
Eosinophils Relative: 1 %
HCT: 33.8 % — ABNORMAL LOW (ref 39.0–52.0)
Hemoglobin: 10.6 g/dL — ABNORMAL LOW (ref 13.0–17.0)
Immature Granulocytes: 2 %
Lymphocytes Relative: 11 %
Lymphs Abs: 1.6 10*3/uL (ref 0.7–4.0)
MCH: 34.6 pg — ABNORMAL HIGH (ref 26.0–34.0)
MCHC: 31.4 g/dL (ref 30.0–36.0)
MCV: 110.5 fL — ABNORMAL HIGH (ref 80.0–100.0)
Monocytes Absolute: 1.2 10*3/uL — ABNORMAL HIGH (ref 0.1–1.0)
Monocytes Relative: 8 %
Neutro Abs: 11.4 10*3/uL — ABNORMAL HIGH (ref 1.7–7.7)
Neutrophils Relative %: 77 %
Platelets: 142 10*3/uL — ABNORMAL LOW (ref 150–400)
RBC: 3.06 MIL/uL — ABNORMAL LOW (ref 4.22–5.81)
RDW: 15.6 % — ABNORMAL HIGH (ref 11.5–15.5)
WBC: 14.7 10*3/uL — ABNORMAL HIGH (ref 4.0–10.5)
nRBC: 0 % (ref 0.0–0.2)

## 2020-12-03 LAB — BASIC METABOLIC PANEL
Anion gap: 9 (ref 5–15)
BUN: <5 mg/dL — ABNORMAL LOW (ref 6–20)
CO2: 23 mmol/L (ref 22–32)
Calcium: 8.4 mg/dL — ABNORMAL LOW (ref 8.9–10.3)
Chloride: 100 mmol/L (ref 98–111)
Creatinine, Ser: 1.12 mg/dL (ref 0.61–1.24)
GFR, Estimated: >60 mL/min (ref >60)
Glucose, Bld: 93 mg/dL (ref 70–99)
Potassium: 3.6 mmol/L (ref 3.5–5.1)
Sodium: 132 mmol/L — ABNORMAL LOW (ref 135–145)

## 2020-12-03 LAB — URINALYSIS, ROUTINE W REFLEX MICROSCOPIC
Bilirubin Urine: NEGATIVE
Glucose, UA: NEGATIVE mg/dL
Ketones, ur: NEGATIVE mg/dL
Nitrite: NEGATIVE
Protein, ur: 30 mg/dL — AB
Specific Gravity, Urine: 1.003 — ABNORMAL LOW (ref 1.005–1.030)
WBC, UA: >50 WBC/hpf — ABNORMAL HIGH (ref 0–5)
pH: 6 (ref 5.0–8.0)

## 2020-12-03 LAB — POC OCCULT BLOOD, ED: Fecal Occult Bld: POSITIVE — AB

## 2020-12-03 MED ORDER — FLUCONAZOLE 150 MG PO TABS
150.0000 mg | ORAL_TABLET | Freq: Once | ORAL | Status: AC
  Administered 2020-12-03: 150 mg via ORAL
  Filled 2020-12-03: qty 1

## 2020-12-03 NOTE — Discharge Instructions (Addendum)
Seen here for urinary symptoms and diarrhea.  Lab work and imaging look reassuring.  I saw that your urine culture grew yeast.  Have given you a single dose of fluconazole here which should help treat your infection.  I would like you to continue taking your Cipro as prescribed.  Do not consume alcohol while taking this medication as it can react poorly with it.  You may take Pepto-Bismol for relief of diarrhea.    I would like you to follow-up with your infectious disease doctor in 1 week's time for reevaluation of your urine.  Also like you to follow-up with your GI doctor as I suspect he may have an internal hemorrhoid.  Come back to the emergency department if you develop chest pain, shortness of breath, severe abdominal pain, uncontrolled nausea, vomiting, diarrhea.

## 2020-12-03 NOTE — ED Provider Notes (Signed)
[facility] DEPT Provider Note   CSN: [id number] Arrival date & time: 12/03/20  1459     History Chief Complaint  Patient presents with  . Numbness    [name] is a 35 y.o. male.  HPI   Patient with significant medical history of bipolar, depression, HIV, hypertension, PTSD peripheral neuropathy presents with a multitude of complaints.  He endorses that he is having worsening urinary symptoms and hematuria.  He endorses that symptoms have been going on for last month, he denies recent trauma to the area.  Patient states that he recently saw his ID doctor where they performed a urine culture, on 2/16, shows that he has a yeast isolates and he was started on Cipro.  He endorses that since starting his Cipro he is continuing to have a burning sensation when he urinates, urinary frequency but denies  penile discharge or testicular pain, he denies flank pain, or abdominal pain.  He has no history of kidney stones, states he is not sexually active, denies oral lesions, worsening rashes on his body.  Patient also states that he is here because he is having bloddy stools, states this has  been on for a month. endores  states he has IBS and will have diarrhea.  He states he generally has bloody stools at nighttime.  States food does not make it worse.  He denies alleviating factors.  He has no history of stomach ulcers, smoking history, prolonged NSAID use or alcohol use, no history of hemorrhoids.  Patient denies headaches, fevers, chills, shortness of breath, chest pain, abdominal pain, nausea, vomiting, worsening pedal edema.  Past Medical History:  Diagnosis Date  . Bipolar 1 disorder ([place])   . Depression   . Dizziness and giddiness 02/01/2016  . Herpes genitalia   . HIV disease ([place])   . Hypertension   . Migraine headache 02/01/2016  . Peripheral neuropathy 10/01/2019  . PTSD (post-traumatic stress disorder)   . Schizoaffective disorder ([place])   .  Seizures [facility])     Patient Active Problem List   Diagnosis Date Noted  . Lymphadenopathy 09/29/2020  . Anemia   . Upper urinary tract infection   . Sepsis secondary to UTI ([place]) 03/14/2020  . Cellulitis of groin 03/14/2020  . Elevated LFTs 03/14/2020  . HIV disease ([place])   . Acute encephalopathy   . Hyponatremia   . Macrocytic anemia   . Thrombocytopenia ([place])   . Prolonged QT interval   . Acute respiratory failure due to COVID-19 ([place]) 10/27/2019  . GERD (gastroesophageal reflux disease) 10/27/2019  . Hypokalemia 10/27/2019  . Peripheral neuropathy 10/01/2019  . PTSD (post-traumatic stress disorder) 07/23/2018  . Dizziness and giddiness 02/01/2016  . Migraine headache 02/01/2016  . Schizoaffective disorder, depressive type ([place]) 05/13/2015  . Severe alcohol dependence ([place]) 05/13/2015  . Suicidal ideation 01/12/2014    Past Surgical History:  Procedure Laterality Date  . BACK SURGERY    . HAND SURGERY         Family History  Problem Relation Age of Onset  . Alcohol abuse Mother   . Schizophrenia Father   . Depression Father   . Alcohol abuse Father   . Alcohol abuse Paternal Uncle   . Alcohol abuse Paternal Uncle     Social History   Tobacco Use  . Smoking status: Current Every Day Smoker    Packs/day: 1.00    Types: Cigarettes  . Smokeless tobacco: Never Used  Vaping Use  . Vaping  Use: Never used  Substance Use Topics  . Alcohol use: Yes    Alcohol/week: 14.0 standard drinks    Types: 14 Cans of beer per week    Comment: 5-6 40oz per day  . Drug use: No    Home Medications Prior to Admission medications   Medication Sig Start Date End Date Taking? Authorizing Provider  albuterol (PROVENTIL HFA;VENTOLIN HFA) 108 (90 BASE) MCG/ACT inhaler Inhale 2 puffs into the lungs every 6 (six) hours as needed for wheezing or shortness of breath.    [provider]  alprazolam Duanne Moron) 2 MG tablet Take 2 mg by mouth 3 (three) times daily.    [provider]  amoxicillin-clavulanate (AUGMENTIN) 875-125 MG tablet Take 1 tablet by mouth every 12 (twelve) hours. 10/23/20   Hazel Sams, PA-C  benzonatate (TESSALON) 100 MG capsule Take 200 mg by mouth every 8 (eight) hours as needed for cough. 10/23/19   [provider]  bictegravir-emtricitabine-tenofovir AF (BIKTARVY) 50-200-25 MG TABS tablet Take 1 tablet by mouth daily.  02/24/17   [provider]  cetirizine (ZYRTEC) 10 MG tablet Take 1 tablet (10 mg total) by mouth daily. Patient not taking: Reported on 03/14/2020 05/15/15   Niel Hummer, NP  famotidine (PEPCID) 40 MG tablet Take 40 mg by mouth daily.    [provider]  FLUoxetine (PROZAC) 40 MG capsule Take 40 mg by mouth daily.    [provider]  folic acid (FOLVITE) 1 MG tablet Take 1 mg by mouth daily. 10/03/19   [provider]  furosemide (LASIX) 40 MG tablet Take 40 mg by mouth daily.  02/27/20   [provider]  haloperidol (HALDOL) 5 MG tablet Take 5 mg by mouth daily.  06/19/19   [provider]  hydrOXYzine (ATARAX/VISTARIL) 50 MG tablet Take 1 tablet (50 mg total) by mouth every 6 (six) hours as needed for anxiety. 07/25/18   Pennelope Bracken, MD  linaclotide (LINZESS) 145 MCG CAPS capsule Take 145 mcg by mouth daily as needed (constipation).    [provider]  lurasidone 120 MG TABS Take 1 tablet (120 mg total) by mouth daily. 05/15/15   Niel Hummer, NP  meclizine (ANTIVERT) 25 MG tablet Take 25 mg by mouth every 8 (eight) hours as needed for dizziness.  03/02/20   [provider]  ondansetron (ZOFRAN) 8 MG tablet Take 8 mg by mouth every 8 (eight) hours as needed for nausea/vomiting. 10/03/19   [provider]  pantoprazole (PROTONIX) 40 MG tablet Take 40 mg by mouth every morning. 03/02/20   [provider]  promethazine (PHENERGAN) 12.5 MG tablet Take 12.5 mg by mouth 2 (two) times daily as needed for nausea or  vomiting.  03/02/20   [provider]  thiamine (VITAMIN B-1) 100 MG tablet Take 100 mg by mouth daily.  01/31/19   [provider]  topiramate (TOPAMAX) 100 MG tablet Take 1 tablet (100 mg total) by mouth at bedtime. 08/06/20   Suzzanne Cloud, NP  traZODone (DESYREL) 300 MG tablet Take 1 tablet (300 mg total) by mouth at bedtime as needed for sleep. Patient taking differently: Take 300 mg by mouth at bedtime.  05/15/15   Niel Hummer, NP  valACYclovir (VALTREX) 1000 MG tablet Take 1 tablet (1,000 mg total) by mouth daily. 05/15/15   Niel Hummer, NP  Vitamin D, Ergocalciferol, (DRISDOL) 1.25 MG (50000 UNIT) CAPS capsule Take 50,000 Units by mouth once a week.  10/03/19   [provider]  zolpidem (AMBIEN) 10 MG tablet Take 10 mg by mouth at bedtime. 09/06/19   [provider]    Allergies    Dapsone and Primaquine phosphate  Review of Systems   Review of Systems  Constitutional: Negative for chills and fever.  HENT: Negative for congestion and sore throat.   Eyes: Negative for visual disturbance.  Respiratory: Negative for shortness of breath.   Cardiovascular: Negative for chest pain.  Gastrointestinal: Positive for blood in stool and diarrhea. Negative for abdominal pain, constipation, nausea, rectal pain and vomiting.  Genitourinary: Positive for dysuria, frequency and hematuria. Negative for enuresis, penile pain, scrotal swelling and testicular pain.  Musculoskeletal: Negative for back pain.  Skin: Negative for rash.  Neurological: Negative for dizziness and headaches.  Hematological: Does not bruise/bleed easily.    Physical Exam Updated Vital Signs BP (!) 138/92   Pulse 93   Temp 98 F (36.7 C) (Oral)   Resp 18   Ht 6' 3"  (1.905 m)   Wt 112.9 kg   SpO2 100%   BMI 31.12 kg/m   Physical Exam Vitals and nursing note reviewed. Exam conducted with a chaperone present.  Constitutional:      General: He is not in acute distress.     Appearance: He is not ill-appearing.  HENT:     Head: Normocephalic and atraumatic.     Nose: No congestion.     Mouth/Throat:     Mouth: Mucous membranes are moist.     Pharynx: Oropharynx is clear. No oropharyngeal exudate or posterior oropharyngeal erythema.  Eyes:     Conjunctiva/sclera: Conjunctivae normal.  Cardiovascular:     Rate and Rhythm: Normal rate and regular rhythm.     Pulses: Normal pulses.     Heart sounds: No murmur heard. No friction rub. No gallop.   Pulmonary:     Effort: No respiratory distress.     Breath sounds: No wheezing, rhonchi or rales.  Abdominal:     Palpations: Abdomen is soft.     Tenderness: There is no abdominal tenderness. There is no right CVA tenderness or left CVA tenderness.  Genitourinary:    Penis: Normal.      Testes: Normal.     Comments: With chaperone present genitals were examined, there is no gross abnormalities present, no discharge or drainage present.  Testicles are nontender to palpation.  Rectal exam was performed, there is no external hemorrhoids present my exam, he was tender during palpation, and had a streak of bright red blood.  Suspect this is internal hemorrhoids. Musculoskeletal:     Right lower leg: No edema.     Left lower leg: No edema.     Comments: Patient's left calf is larger than patient's right calf, slight edema noted in the left foot  Neurovascular intact in both legs.  Patient is moving upper and lower extremities at difficulty.  Skin:    General: Skin is warm and dry.  Neurological:     Mental Status: He is alert.  Psychiatric:        Mood and Affect: Mood normal.     ED Results / Procedures / Treatments   Labs (all labs ordered are listed, but only abnormal results are displayed) Labs Reviewed  URINALYSIS, ROUTINE W REFLEX MICROSCOPIC - Abnormal; Notable for the following components:      Result Value   APPearance CLOUDY (*)    Specific Gravity, Urine 1.003 (*)    Hgb urine  dipstick MODERATE  (*)    Protein, ur 30 (*)    Leukocytes,Ua LARGE (*)    WBC, UA >50 (*)    Bacteria, UA MANY (*)    Non Squamous Epithelial 6-10 (*)    All other components within normal limits  BASIC METABOLIC PANEL - Abnormal; Notable for the following components:   Sodium 132 (*)    BUN <5 (*)    Calcium 8.4 (*)    All other components within normal limits  CBC WITH DIFFERENTIAL/PLATELET - Abnormal; Notable for the following components:   WBC 14.7 (*)    RBC 3.06 (*)    Hemoglobin 10.6 (*)    HCT 33.8 (*)    MCV 110.5 (*)    MCH 34.6 (*)    RDW 15.6 (*)    Platelets 142 (*)    Neutro Abs 11.4 (*)    Monocytes Absolute 1.2 (*)    Abs Immature Granulocytes 0.32 (*)    All other components within normal limits  POC OCCULT BLOOD, ED - Abnormal; Notable for the following components:   Fecal Occult Bld POSITIVE (*)    All other components within normal limits  URINE CULTURE  GC/CHLAMYDIA PROBE AMP ([place]) NOT AT [facility]    EKG None  Radiology No results found.  Procedures Procedures   Medications Ordered in ED Medications  fluconazole (DIFLUCAN) tablet 150 mg (150 mg Oral Given 12/03/20 1839)    ED Course  I have reviewed the triage vital signs and the nursing notes.  Pertinent labs & imaging results that were available during my care of the patient were reviewed by me and considered in my medical decision making (see chart for details).    MDM Rules/Calculators/A&P                         Initial impression-patient presents with concerns of hematuria and hematochezia.  Patient is alert, does not appear in acute distress, vital signs reassuring.  Will obtain basic lab work, perform Hemoccult and reevaluate.  Work-up-CBC shows leukocytosis and macrocytic anemia appears to be a baseline for patient.  BMP shows slight hyponatremia of 132, no anion gap present.  UA shows large leukocytes, 21-50 red blood cells, 50 white blood cells, many bacteria, squamous cells present.  Hemoccult  positive.  Rule out-I have low suspicion for pyelonephritis as patient has no CVA tenderness, patient is nontoxic-appearing, vital signs reassuring.  Urine is abnormal, with red and white blood cells and many bacteria, this appears to be unchanged from prior UA.  After reviewing culture shows that he has a yeast infection currently on ciprofloxacin, will provide him with a dose of fluconazole as I suspect this is what causing him to have dysuria and abnormal UA.  Low suspicion for kidney stone as he had no CVA tenderness, abdomen soft nontender to palpation, no history of kidney stones in the past.  Low suspicion for C. difficile as patient denies explosive diarrhea, no foul odors, no history of C. Difficile, he has not recent been on clindamycin.  Low suspicion for an upper GI bleed as abdomen is soft nontender to palpation, no peritoneal signs on my exam.  Patient does have noted positive Hemoccult and was tender on my  rectal exam, I suspect he may have a internal hemorrhoid.  Low suspicion for systemic infection as patient is nontoxic-appearing, vital signs reassuring, no obvious source infection on my exam.  I suspect slightly elevated leukocytosis secondary  due to possible yeast infection.   Plan-I suspect patient's dysuria secondary due to a yeast infection treat him with a dose of fluconazole will have him continue with his Cipro and follow-up with his infectious disease doctor for further evaluation.  I suspect patient's bloody stools are secondary to internal hemorrhoids will recommend he follows up with GI for further evaluation.  Vital signs have remained stable, no indication for hospital admission.  Patient discussed with attending and they agreed with assessment and plan.  Patient given at home care as well strict return precautions.  Patient verbalized that they understood agreed to said plan.    Final Clinical Impression(s) / ED Diagnoses Final diagnoses:  Dysuria  Bloody diarrhea     Rx / DC Orders ED Discharge Orders    None       Marcello Fennel, PA-C 12/03/20 1841    Lacretia Leigh, MD 12/07/20 [phone]

## 2020-12-03 NOTE — ED Triage Notes (Signed)
Patient c/o Numbness all over my entire body" x 1 1/2 weeks. Patient also c/o hematuria. Patient states he was diagnosed with a UTI and was given an antibiotic. Patient states that "it's not working." Patient states he still has pain when he urinates and blood present. Patient added that he has been having bloody stools at least once a week for the past 2 months.

## 2020-12-03 NOTE — ED Notes (Signed)
Gave pt 2 pairs of socks to go home with

## 2020-12-04 LAB — GC/CHLAMYDIA PROBE AMP (~~LOC~~) NOT AT ARMC
Chlamydia: NEGATIVE
Comment: NEGATIVE
Comment: NORMAL
Neisseria Gonorrhea: NEGATIVE

## 2020-12-05 LAB — URINE CULTURE: Culture: 50000 — AB

## 2020-12-06 ENCOUNTER — Telehealth (HOSPITAL_BASED_OUTPATIENT_CLINIC_OR_DEPARTMENT_OTHER): Payer: Self-pay | Admitting: Emergency Medicine

## 2020-12-06 NOTE — Progress Notes (Signed)
ED Antimicrobial Stewardship Positive Culture Follow Up   [name] is an 35 y.o. male who presented to [facility] on 12/03/2020 with a chief complaint of  Chief Complaint  Patient presents with  . Numbness    Recent Results (from the past 720 hour(s))  SARS-COV-2 RNA,(COVID-19) QUAL NAAT     Status: Abnormal   Collection Time: 11/09/20 12:00 AM  Result Value Ref Range Status   SARS CoV2 RNA DETECTED (A) NOT DETECT Final    Comment: . A Detected result indicates that the patient's specimen was positive for SARS-CoV-2 RNA. [name] Test Method: Nucleic Acid Amplification Test including reverse transcription polymerase chain reaction (RT-PCR) and transcription mediated amplification (TMA). The test method meets the [place] Centers for Disease Control and prevention (CDC) pre departure and arrival requirement for viral test for COVID-19 dated November 07, 2019. Testing requirements for traveling may change with time. The patient is responsible for determining the test requirements for each nation while they are traveling. . This test has been authorized by the FDA under an  Emergency Use Authorization (EUA) for use by authorized laboratories. . Please review the "Fact Sheets" and FDA authorized labeling available for health care providers and patients using the following websites: https://www.[facility].com/home/Covid-19/HCP/NAAT/fact-sheet2  https://www.[facility].com/home/Covid-19/Patients/NAAT / fact-sheet2 . Due to the current public health emergency, [facility] is accepting samples from appropriate clinical sources collected using wide variety of swabs and transport media for COVID-19. Not detected test results derived from specimens received in non- commercially manufactured viral collection kits or those not yet authorized by FDA for COVID-19 testing should be cautiously evaluated and take extra precautions such as additional clinical monitoring,  including collection of an additional specimen. . Additional information about COVID-19 can be found at the [employer] website: www.[facility].com/Covid19. . We received a frozen specimen in Phosphate Buffered Saline (PBS) or an ESwab for SARS-CoV-2 (COVID19) and Influenza A and B, NAAT or SARS CoV2 RNA(COVID19) and Respiratory Viral Panel, QL NAAT or SARS-CoV-2 RNA(COVID19) and Respiratory Pathogen Panel, NAAT. [name] Frozen specimens received in Phosphate  Buffered Saline (PBS) or collec ted utilizing ESwab are not appropriate for this request. Based upon the specimen submitted, SARS-CoV-2 RNA (COVID-19), Qualitative NAAT was performed. . If this is not what you intended to order, please contact your local client service representative immediately so that we can adjust our billing appropriately. You may also inquire about alternative or additional testing.   Urine Culture     Status: Abnormal   Collection Time: 11/25/20 12:00 AM  Result Value Ref Range Status   MICRO NUMBER: [id number]  Final   SPECIMEN QUALITY: Adequate  Final   Sample Source BLADDER  Final   STATUS: FINAL  Final   ISOLATE 1: Yeast Isolated. (A)  Final    Comment: 10,000-49,000 CFU/mL of Yeast Isolated. Please contact the laboratory within 3 days if further identification is desired.  Urine culture     Status: Abnormal   Collection Time: 12/03/20  5:02 PM   Specimen: Urine, Random  Result Value Ref Range Status   Specimen Description   Final    URINE, RANDOM Performed at [facility] [address], [place], [place] [address]    Special Requests   Final    NONE Performed at [facility], [place] [address], [place], [place] [address]    Culture 50,000 COLONIES/mL YEAST (A)  Final   Report Status 12/05/2020 FINAL  Final    [x]  Patient discharged originally without antimicrobial agent  and treatment is now indicated  Pt with yeast in urine, c/o dysuria not  improving on course of ciprofloxacin. Recently prescribed terbenafine course for tinea cruis.  New antibiotic prescription: Fluconazole 200 mg PO daily x14 days. No refill  ED Provider: Alyse Low, [place], PharmD 12/06/2020, 11:11 AM Clinical Pharmacist [phone]

## 2020-12-06 NOTE — Telephone Encounter (Signed)
Post ED Visit - Positive Culture Follow-up: Successful Patient Follow-Up  Culture assessed and recommendations reviewed by:  []  Elenor Quinones, Pharm.D. []  Heide Guile, Pharm.D., BCPS AQ-ID []  Parks Neptune, Pharm.D., BCPS []  Alycia Rossetti, Pharm.D., BCPS []  [place], [place].D., BCPS, AAHIVP []  Legrand Como, Pharm.D., BCPS, AAHIVP []  Salome Arnt, PharmD, BCPS []  Johnnette Gourd, PharmD, BCPS []  Hughes Better, PharmD, BCPS [x]  Samul Dada, PharmD  Positive urine culture  [x]  Patient discharged without antimicrobial prescription and treatment is now indicated []  Organism is resistant to prescribed ED discharge antimicrobial []  Patient with positive blood cultures  Changes discussed with ED provider: Alyse Low PA New antibiotic prescription:  Fluconazole 200 mg PO daily x 14 days Called to [place]  Contacted patient, date 12/06/2020, time [place] 12/06/2020, 4:14 PM

## 2020-12-25 ENCOUNTER — Other Ambulatory Visit: Payer: Self-pay | Admitting: *Deleted

## 2020-12-25 DIAGNOSIS — D539 Nutritional anemia, unspecified: Secondary | ICD-10-CM

## 2020-12-28 NOTE — Progress Notes (Incomplete)
Patient Care Team: [name], MD as PCP - General (Internal Medicine) Bernita Raisin, MD as Consulting Physician  DIAGNOSIS: No diagnosis found.  CHIEF COMPLIANT: Follow-up of macrocytosis and lymphadenopathy  INTERVAL HISTORY: [name] is a 35 y.o. with above-mentioned history of macrocytosis. he presents to the clinic today for follow-up  ALLERGIES:  is allergic to dapsone and primaquine phosphate.  MEDICATIONS:  Current Outpatient Medications  Medication Sig Dispense Refill  . albuterol (PROVENTIL HFA;VENTOLIN HFA) 108 (90 BASE) MCG/ACT inhaler Inhale 2 puffs into the lungs every 6 (six) hours as needed for wheezing or shortness of breath.    . alprazolam (XANAX) 2 MG tablet Take 2 mg by mouth 3 (three) times daily.    [name] amoxicillin-clavulanate (AUGMENTIN) 875-125 MG tablet Take 1 tablet by mouth every 12 (twelve) hours. 14 tablet 0  . benzonatate (TESSALON) 100 MG capsule Take 200 mg by mouth every 8 (eight) hours as needed for cough.    . bictegravir-emtricitabine-tenofovir AF (BIKTARVY) 50-200-25 MG TABS tablet Take 1 tablet by mouth daily.     . cetirizine (ZYRTEC) 10 MG tablet Take 1 tablet (10 mg total) by mouth daily. (Patient not taking: Reported on 03/14/2020)    . famotidine (PEPCID) 40 MG tablet Take 40 mg by mouth daily.    [name] FLUoxetine (PROZAC) 40 MG capsule Take 40 mg by mouth daily.    . folic acid (FOLVITE) 1 MG tablet Take 1 mg by mouth daily.    . furosemide (LASIX) 40 MG tablet Take 40 mg by mouth daily.     . haloperidol (HALDOL) 5 MG tablet Take 5 mg by mouth daily.     . hydrOXYzine (ATARAX/VISTARIL) 50 MG tablet Take 1 tablet (50 mg total) by mouth every 6 (six) hours as needed for anxiety. 30 tablet 0  . linaclotide (LINZESS) 145 MCG CAPS capsule Take 145 mcg by mouth daily as needed (constipation).    [name] lurasidone 120 MG TABS Take 1 tablet (120 mg total) by mouth daily. 30 tablet 0  . meclizine (ANTIVERT) 25 MG tablet Take 25 mg by mouth every  8 (eight) hours as needed for dizziness.     . ondansetron (ZOFRAN) 8 MG tablet Take 8 mg by mouth every 8 (eight) hours as needed for nausea/vomiting.    . pantoprazole (PROTONIX) 40 MG tablet Take 40 mg by mouth every morning.    . promethazine (PHENERGAN) 12.5 MG tablet Take 12.5 mg by mouth 2 (two) times daily as needed for nausea or vomiting.     . thiamine (VITAMIN B-1) 100 MG tablet Take 100 mg by mouth daily.     [name] topiramate (TOPAMAX) 100 MG tablet Take 1 tablet (100 mg total) by mouth at bedtime. 90 tablet 1  . traZODone (DESYREL) 300 MG tablet Take 1 tablet (300 mg total) by mouth at bedtime as needed for sleep. (Patient taking differently: Take 300 mg by mouth at bedtime. ) 30 tablet 0  . valACYclovir (VALTREX) 1000 MG tablet Take 1 tablet (1,000 mg total) by mouth daily.    . Vitamin D, Ergocalciferol, (DRISDOL) 1.25 MG (50000 UNIT) CAPS capsule Take 50,000 Units by mouth once a week.    . zolpidem (AMBIEN) 10 MG tablet Take 10 mg by mouth at bedtime.     No current facility-administered medications for this visit.    PHYSICAL EXAMINATION: ECOG PERFORMANCE STATUS: {CHL ONC ECOG PS:[phone]}  There were no vitals filed for this visit. There were no vitals filed for  this visit.  BREAST:*** No palpable masses or nodules in either right or left breasts. No palpable axillary supraclavicular or infraclavicular adenopathy no breast tenderness or nipple discharge. (exam performed in the presence of a chaperone)  LABORATORY DATA:  I have reviewed the data as listed CMP Latest Ref Rng & Units 12/03/2020 11/25/2020 09/14/2020  Glucose 70 - 99 mg/dL 93 88 107(H)  BUN 6 - 20 mg/dL <5(L) 3(L) <4(L)  Creatinine 0.61 - 1.24 mg/dL 1.12 1.23 1.08  Sodium 135 - 145 mmol/L 132(L) 133(L) 135  Potassium 3.5 - 5.1 mmol/L 3.6 3.7 3.7  Chloride 98 - 111 mmol/L 100 101 99  CO2 22 - 32 mmol/L 23 22 26   Calcium 8.9 - 10.3 mg/dL 8.4(L) 8.6 9.7  Total Protein 6.1 - 8.1 g/dL - 7.6 8.7(H)  Total  Bilirubin 0.2 - 1.2 mg/dL - 0.4 0.3  Alkaline Phos 38 - 126 U/L - - 176(H)  AST 10 - 40 U/L - 57(H) 17  ALT 9 - 46 U/L - 15 9    Lab Results  Component Value Date   WBC 14.7 (H) 12/03/2020   HGB 10.6 (L) 12/03/2020   HCT 33.8 (L) 12/03/2020   MCV 110.5 (H) 12/03/2020   PLT 142 (L) 12/03/2020   NEUTROABS 11.4 (H) 12/03/2020    ASSESSMENT & PLAN:  No problem-specific Assessment & Plan notes found for this encounter.    No orders of the defined types were placed in this encounter.  The patient has a good understanding of the overall plan. he agrees with it. he will call with any problems that may develop before the next visit here.  Total time spent: *** mins including face to face time and time spent for planning, charting and coordination of care  Rulon Eisenmenger, MD, MPH 12/28/2020  I, Molly Dorshimer, am acting as scribe for Dr. Nicholas Lose.  {insert scribe attestation}

## 2020-12-29 ENCOUNTER — Inpatient Hospital Stay: Payer: Medicare HMO

## 2020-12-29 ENCOUNTER — Inpatient Hospital Stay: Payer: Medicare HMO | Attending: Hematology and Oncology | Admitting: Hematology and Oncology

## 2020-12-29 NOTE — Assessment & Plan Note (Deleted)
Inguinal and external iliac lymphadenopathy detected on CT scan done in December. I discussed with him that the cause of lymphadenopathy is cellulitis in his groin area.  He has multiple boils and frequent abscesses.

## 2021-01-13 ENCOUNTER — Other Ambulatory Visit: Payer: Self-pay | Admitting: Gastroenterology

## 2021-02-03 ENCOUNTER — Other Ambulatory Visit: Payer: Self-pay | Admitting: Internal Medicine

## 2021-02-04 LAB — URINE CULTURE
MICRO NUMBER:: 11821020
SPECIMEN QUALITY:: ADEQUATE

## 2021-02-23 ENCOUNTER — Other Ambulatory Visit (HOSPITAL_COMMUNITY): Payer: Medicare HMO

## 2021-02-23 ENCOUNTER — Other Ambulatory Visit: Payer: Self-pay

## 2021-02-25 NOTE — Anesthesia Preprocedure Evaluation (Addendum)
Anesthesia Evaluation  Patient identified by MRN, date of birth, ID band Patient awake    Reviewed: Allergy & Precautions, NPO status , Patient's Chart, lab work & pertinent test results  Airway Mallampati: II  TM Distance: >3 FB Neck ROM: Full    Dental no notable dental hx. (+) Teeth Intact, Dental Advisory Given   Pulmonary Current Smoker and Patient abstained from smoking.,    Pulmonary exam normal breath sounds clear to auscultation       Cardiovascular hypertension, Normal cardiovascular exam Rhythm:Regular Rate:Normal     Neuro/Psych  Headaches,    GI/Hepatic Neg liver ROS, GERD  ,  Endo/Other  negative endocrine ROS  Renal/GU negative Renal ROS     Musculoskeletal   Abdominal (+) + obese,   Peds  Hematology  (+) anemia ,   Anesthesia Other Findings   Reproductive/Obstetrics                           Anesthesia Physical Anesthesia Plan  ASA: III  Anesthesia Plan: MAC   Post-op Pain Management:    Induction:   PONV Risk Score and Plan: Treatment may vary due to age or medical condition  Airway Management Planned: Natural Airway  Additional Equipment: None  Intra-op Plan:   Post-operative Plan:   Informed Consent: I have reviewed the patients History and Physical, chart, labs and discussed the procedure including the risks, benefits and alternatives for the proposed anesthesia with the patient or authorized representative who has indicated his/her understanding and acceptance.     Dental advisory given  Plan Discussed with:   Anesthesia Plan Comments: (Heme + stool for colonoscopy)       Anesthesia Quick Evaluation

## 2021-02-26 ENCOUNTER — Ambulatory Visit (HOSPITAL_COMMUNITY): Payer: Medicare HMO | Admitting: Anesthesiology

## 2021-02-26 ENCOUNTER — Other Ambulatory Visit: Payer: Self-pay

## 2021-02-26 ENCOUNTER — Ambulatory Visit (HOSPITAL_COMMUNITY)
Admission: RE | Admit: 2021-02-26 | Discharge: 2021-02-26 | Disposition: A | Discharge status: Home / Self Care | Payer: Medicare HMO | Attending: Gastroenterology | Admitting: Gastroenterology

## 2021-02-26 ENCOUNTER — Encounter (HOSPITAL_COMMUNITY): Payer: Self-pay | Admitting: Gastroenterology

## 2021-02-26 ENCOUNTER — Encounter (HOSPITAL_COMMUNITY)
Admission: RE | Disposition: A | Discharge status: Home / Self Care | Payer: Self-pay | Source: Home / Self Care | Attending: Gastroenterology

## 2021-02-26 DIAGNOSIS — I129 Hypertensive chronic kidney disease with stage 1 through stage 4 chronic kidney disease, or unspecified chronic kidney disease: Secondary | ICD-10-CM | POA: Insufficient documentation

## 2021-02-26 DIAGNOSIS — K5900 Constipation, unspecified: Secondary | ICD-10-CM | POA: Insufficient documentation

## 2021-02-26 DIAGNOSIS — Z8616 Personal history of COVID-19: Secondary | ICD-10-CM | POA: Insufficient documentation

## 2021-02-26 DIAGNOSIS — D649 Anemia, unspecified: Secondary | ICD-10-CM | POA: Diagnosis not present

## 2021-02-26 DIAGNOSIS — K625 Hemorrhage of anus and rectum: Secondary | ICD-10-CM | POA: Insufficient documentation

## 2021-02-26 DIAGNOSIS — F17210 Nicotine dependence, cigarettes, uncomplicated: Secondary | ICD-10-CM | POA: Diagnosis not present

## 2021-02-26 DIAGNOSIS — K6289 Other specified diseases of anus and rectum: Secondary | ICD-10-CM | POA: Diagnosis not present

## 2021-02-26 DIAGNOSIS — K648 Other hemorrhoids: Secondary | ICD-10-CM | POA: Insufficient documentation

## 2021-02-26 DIAGNOSIS — R42 Dizziness and giddiness: Secondary | ICD-10-CM | POA: Diagnosis not present

## 2021-02-26 DIAGNOSIS — K2950 Unspecified chronic gastritis without bleeding: Secondary | ICD-10-CM | POA: Insufficient documentation

## 2021-02-26 DIAGNOSIS — G43919 Migraine, unspecified, intractable, without status migrainosus: Secondary | ICD-10-CM | POA: Insufficient documentation

## 2021-02-26 DIAGNOSIS — Z21 Asymptomatic human immunodeficiency virus [HIV] infection status: Secondary | ICD-10-CM | POA: Diagnosis not present

## 2021-02-26 DIAGNOSIS — N182 Chronic kidney disease, stage 2 (mild): Secondary | ICD-10-CM | POA: Diagnosis not present

## 2021-02-26 DIAGNOSIS — K259 Gastric ulcer, unspecified as acute or chronic, without hemorrhage or perforation: Secondary | ICD-10-CM | POA: Diagnosis not present

## 2021-02-26 DIAGNOSIS — Z888 Allergy status to other drugs, medicaments and biological substances status: Secondary | ICD-10-CM | POA: Diagnosis not present

## 2021-02-26 DIAGNOSIS — R195 Other fecal abnormalities: Secondary | ICD-10-CM | POA: Diagnosis not present

## 2021-02-26 DIAGNOSIS — Z79899 Other long term (current) drug therapy: Secondary | ICD-10-CM | POA: Insufficient documentation

## 2021-02-26 DIAGNOSIS — K2289 Other specified disease of esophagus: Secondary | ICD-10-CM | POA: Diagnosis not present

## 2021-02-26 HISTORY — PX: COLONOSCOPY WITH PROPOFOL: SHX5780

## 2021-02-26 HISTORY — PX: BIOPSY: SHX5522

## 2021-02-26 HISTORY — PX: ESOPHAGOGASTRODUODENOSCOPY (EGD) WITH PROPOFOL: SHX5813

## 2021-02-26 SURGERY — COLONOSCOPY WITH PROPOFOL
Anesthesia: Monitor Anesthesia Care

## 2021-02-26 MED ORDER — LIDOCAINE 2% (20 MG/ML) 5 ML SYRINGE
INTRAMUSCULAR | Status: DC | PRN
  Administered 2021-02-26: 100 mg via INTRAVENOUS

## 2021-02-26 MED ORDER — PROPOFOL 10 MG/ML IV BOLUS
INTRAVENOUS | Status: AC
  Filled 2021-02-26: qty 20

## 2021-02-26 MED ORDER — SODIUM CHLORIDE 0.9 % IV SOLN: INTRAVENOUS | Status: DC

## 2021-02-26 MED ORDER — PROPOFOL 500 MG/50ML IV EMUL
INTRAVENOUS | Status: DC | PRN
  Administered 2021-02-26: 150 ug/kg/min via INTRAVENOUS

## 2021-02-26 MED ORDER — PANTOPRAZOLE SODIUM 40 MG PO TBEC: 40.0000 mg | DELAYED_RELEASE_TABLET | Freq: Two times a day (BID) | ORAL | 1 refills | Status: DC

## 2021-02-26 MED ORDER — LACTATED RINGERS IV SOLN
INTRAVENOUS | Status: AC | PRN
  Administered 2021-02-26: 10 mL/h via INTRAVENOUS

## 2021-02-26 MED ORDER — PROPOFOL 10 MG/ML IV BOLUS
INTRAVENOUS | Status: DC | PRN
  Administered 2021-02-26 (×4): 20 mg via INTRAVENOUS

## 2021-02-26 MED ORDER — PHENYLEPHRINE 40 MCG/ML (10ML) SYRINGE FOR IV PUSH (FOR BLOOD PRESSURE SUPPORT)
PREFILLED_SYRINGE | INTRAVENOUS | Status: DC | PRN
  Administered 2021-02-26: 120 ug via INTRAVENOUS
  Administered 2021-02-26 (×2): 80 ug via INTRAVENOUS

## 2021-02-26 MED ORDER — PROPOFOL 500 MG/50ML IV EMUL
INTRAVENOUS | Status: AC
  Filled 2021-02-26: qty 50

## 2021-02-26 SURGICAL SUPPLY — 25 items

## 2021-02-26 NOTE — Discharge Instructions (Signed)
YOU HAD AN ENDOSCOPIC PROCEDURE TODAY: Refer to the procedure report and other information in the discharge instructions given to you for any specific questions about what was found during the examination. If this information does not answer your questions, please call [facility] office at [phone] to clarify.   YOU SHOULD EXPECT: Some feelings of bloating in the abdomen. Passage of more gas than usual. Walking can help get rid of the air that was put into your GI tract during the procedure and reduce the bloating. If you had a lower endoscopy (such as a colonoscopy or flexible sigmoidoscopy) you may notice spotting of blood in your stool or on the toilet paper. Some abdominal soreness may be present for a day or two, also.  DIET: Your first meal following the procedure should be a light meal and then it is ok to progress to your normal diet. A half-sandwich or bowl of soup is an example of a good first meal. Heavy or fried foods are harder to digest and may make you feel nauseous or bloated. Drink plenty of fluids but you should avoid alcoholic beverages for 24 hours. If you had a esophageal dilation, please see attached instructions for diet.    ACTIVITY: Your care partner should take you home directly after the procedure. You should plan to take it easy, moving slowly for the rest of the day. You can resume normal activity the day after the procedure however YOU SHOULD NOT DRIVE, use power tools, machinery or perform tasks that involve climbing or major physical exertion for 24 hours (because of the sedation medicines used during the test).   SYMPTOMS TO REPORT IMMEDIATELY: A gastroenterologist can be reached at any hour. Please call [phone]  for any of the following symptoms:  . Following lower endoscopy (colonoscopy, flexible sigmoidoscopy) Excessive amounts of blood in the stool  Significant tenderness, worsening of abdominal pains  Swelling of the abdomen that is new, acute  Fever of 100  or higher  . Following upper endoscopy (EGD, EUS, ERCP, esophageal dilation) Vomiting of blood or coffee ground material  New, significant abdominal pain  New, significant chest pain or pain under the shoulder blades  Painful or persistently difficult swallowing  New shortness of breath  Black, tarry-looking or red, bloody stools  FOLLOW UP:  If any biopsies were taken you will be contacted by phone or by letter within the next 1-3 weeks. Call [phone]  if you have not heard about the biopsies in 3 weeks.  Please also call with any specific questions about appointments or follow up tests. YOU HAD AN ENDOSCOPIC PROCEDURE TODAY: Refer to the procedure report and other information in the discharge instructions given to you for any specific questions about what was found during the examination. If this information does not answer your questions, please call [facility] office at [phone] to clarify.   YOU SHOULD EXPECT: Some feelings of bloating in the abdomen. Passage of more gas than usual. Walking can help get rid of the air that was put into your GI tract during the procedure and reduce the bloating. If you had a lower endoscopy (such as a colonoscopy or flexible sigmoidoscopy) you may notice spotting of blood in your stool or on the toilet paper. Some abdominal soreness may be present for a day or two, also.  DIET: Your first meal following the procedure should be a light meal and then it is ok to progress to your normal diet. A half-sandwich or bowl of soup  is an example of a good first meal. Heavy or fried foods are harder to digest and may make you feel nauseous or bloated. Drink plenty of fluids but you should avoid alcoholic beverages for 24 hours.   ACTIVITY: Your care partner should take you home directly after the procedure. You should plan to take it easy, moving slowly for the rest of the day. You can resume normal activity the day after the procedure however YOU SHOULD NOT DRIVE,  use power tools, machinery or perform tasks that involve climbing or major physical exertion for 24 hours (because of the sedation medicines used during the test).   SYMPTOMS TO REPORT IMMEDIATELY: A gastroenterologist can be reached at any hour. Please call [phone]  for any of the following symptoms:  . Following lower endoscopy (colonoscopy, flexible sigmoidoscopy) Excessive amounts of blood in the stool  Significant tenderness, worsening of abdominal pains  Swelling of the abdomen that is new, acute  Fever of 100 or higher  . Following upper endoscopy (EGD, EUS, ERCP, esophageal dilation) Vomiting of blood or coffee ground material  New, significant abdominal pain  New, significant chest pain or pain under the shoulder blades  Painful or persistently difficult swallowing  New shortness of breath  Black, tarry-looking or red, bloody stools  FOLLOW UP:  If any biopsies were taken you will be contacted by phone or by letter within the next 1-3 weeks. Call [phone]  if you have not heard about the biopsies in 3 weeks.  Please also call with any specific questions about appointments or follow up tests.

## 2021-02-26 NOTE — Op Note (Signed)
[facility] Patient Name: [name] Procedure Date: 02/26/2021 MRN: [id number] Attending MD: Otis Brace , MD Date of Birth: [birth year] CSN: [id number] Age: 35 Admit Type: Outpatient Procedure:                Upper GI endoscopy Indications:              Heme positive stool Providers:                Otis Brace, MD, Nelia Shi, RN,                            Tyrone Apple, Technician, Danley Danker, CRNA Referring MD:              Medicines:                Sedation Administered by an Anesthesia Professional Complications:            No immediate complications. Estimated Blood Loss:     Estimated blood loss was minimal. Procedure:                Pre-Anesthesia Assessment:                           - Prior to the procedure, a History and Physical                            was performed, and patient medications and                            allergies were reviewed. The patient's tolerance of                            previous anesthesia was also reviewed. The risks                            and benefits of the procedure and the sedation                            options and risks were discussed with the patient.                            All questions were answered, and informed consent                            was obtained. Prior Anticoagulants: The patient has                            taken no previous anticoagulant or antiplatelet                            agents. ASA Grade Assessment: III - A patient with                            severe systemic disease. After reviewing the risks  and benefits, the patient was deemed in                            satisfactory condition to undergo the procedure.                           After obtaining informed consent, the endoscope was                            passed under direct vision. Throughout the                            procedure, the patient's blood  pressure, pulse, and                            oxygen saturations were monitored continuously. The                            GIF-H190 ([id number]) was introduced through the                            mouth, and advanced to the second part of duodenum.                            The upper GI endoscopy was accomplished without                            difficulty. The patient tolerated the procedure                            well. Scope In: Scope Out: Findings:      Patchy, white plaques were found in the entire esophagus. Biopsies were       taken with a cold forceps for histology.      The Z-line was regular and was found 40 cm from the incisors.      Two non-bleeding superficial gastric ulcers with pigmented material were       found in the gastric antrum. The largest lesion was 6 mm in largest       dimension. Biopsies were taken with a cold forceps for histology.      Scattered mild inflammation characterized by congestion (edema) and       erythema was found in the gastric body.      The cardia and gastric fundus were normal on retroflexion.      The duodenal bulb, first portion of the duodenum and second portion of       the duodenum were normal. Impression:               - Esophageal plaques were found, suspicious for                            candidiasis. Biopsied.                           - Z-line regular, 40 cm from the incisors.                           -  Non-bleeding gastric ulcers with pigmented                            material. Biopsied.                           - Gastritis.                           - Normal duodenal bulb, first portion of the                            duodenum and second portion of the duodenum. Moderate Sedation:      Moderate (conscious) sedation was personally administered by an       anesthesia professional. The following parameters were monitored: oxygen       saturation, heart rate, blood pressure, and response to care. Recommendation:            - Patient has a contact number available for                            emergencies. The signs and symptoms of potential                            delayed complications were discussed with the                            patient. Return to normal activities tomorrow.                            Written discharge instructions were provided to the                            patient.                           - Resume previous diet.                           - Continue present medications.                           - Await pathology results.                           - Repeat upper endoscopy in 3 months to check                            healing.                           - Use Protonix (pantoprazole) 40 mg PO BID for 3                            months. Procedure Code(s):        --- Professional ---  16109, Esophagogastroduodenoscopy, flexible,                            transoral; with biopsy, single or multiple Diagnosis Code(s):        --- Professional ---                           K22.9, Disease of esophagus, unspecified                           K25.9, Gastric ulcer, unspecified as acute or                            chronic, without hemorrhage or perforation                           K29.70, Gastritis, unspecified, without bleeding                           R19.5, Other fecal abnormalities CPT copyright 2019 American Medical Association. All rights reserved. The codes documented in this report are preliminary and upon coder review may  be revised to meet current compliance requirements. Otis Brace, MD Otis Brace, MD 02/26/2021 10:35:24 AM Number of Addenda: 0

## 2021-02-26 NOTE — H&P (Signed)
Primary Care Physician:  [name], MD Primary Gastroenterologist:  Dr. Alessandra Bevels  Reason for Visit : Rectal bleeding, anemia, heme positive stool  HPI: [name] is a 35 y.o. male here for outpatient EGD and colonoscopy for evaluation of anemia, rectal bleeding and heme positive stool.  He is complaining of intermittent rectal bleeding for last several months. Usually rectal bleeding after bowel movements. Complaining of diarrhea alternating with constipation. Complaining of occasional abdominal discomfort which gets better with bowel movement. Occasional nausea. Denies any vomiting. He is gaining weight now.        Patient with past medical history of schizophrenia, PTSD, history of HIV, chronic kidney disease stage II, history of chronic migraine followed by neurology history of abnormal LFTs, history of constipation was on Linzess 145 mcg.         Was admitted to the hospital in June 2021 with confusion and dysuria. Was found to have jaundice with total bilirubin of around 11. CT abdomen pelvis were concerning for developing pubic area abscess. He was treated with vancomycin, Flagyl and cefepime during hospitalization. He was also started on lactulose for possible hepatic encephalopathy.        Was seen by [name] hepatology in July 2021. Repeat blood work at that time showed improvement in LFTs with T bili of 3.1, alkaline phosphatase 157, AST 76 and ALT 44. He has stopped drinking alcohol         For abnormal LFTs- blood work in the past showing elevated AMA and ASMA as well as elevated immunoglobulin G level. He has been referred to [facility] and was seen by [facility] liver specialist in March 2020. Liver biopsy in January 2021 showed steatohepatitis.         He was diagnosed with COVID in January 2021.         Previous GI workup        ultrasound 07/2018 was ordered which showed possible right hepatic mass. Follow-up MRI/MRCP in 08/2018 showed focal fatty sparing and no liver mass.  Normal-appearing pancreas.        No family history of colon cancer.        He is also followed by neurology for intractable migraine headache and chronic dizziness.        Followed by infectious disease for his HIV.   Past Medical History:  Diagnosis Date  . Bipolar 1 disorder ([place])   . Depression   . Dizziness and giddiness 02/01/2016  . Herpes genitalia   . HIV disease ([place])   . Hypertension   . Migraine headache 02/01/2016  . Peripheral neuropathy 10/01/2019  . PTSD (post-traumatic stress disorder)   . Schizoaffective disorder ([place])   . Seizures ([place])     Past Surgical History:  Procedure Laterality Date  . BACK SURGERY    . HAND SURGERY      Prior to Admission medications   Medication Sig Start Date End Date Taking? Authorizing Provider  albuterol (PROVENTIL HFA;VENTOLIN HFA) 108 (90 BASE) MCG/ACT inhaler Inhale 2 puffs into the lungs every 6 (six) hours as needed for wheezing or shortness of breath.   Yes [provider]  alprazolam Duanne Moron) 2 MG tablet Take 2 mg by mouth 3 (three) times daily.   Yes [provider]  amoxicillin-clavulanate (AUGMENTIN) 875-125 MG tablet Take 1 tablet by mouth every 12 (twelve) hours. 10/23/20  Yes Hazel Sams, PA-C  bictegravir-emtricitabine-tenofovir AF (BIKTARVY) 50-200-25 MG TABS tablet Take 1 tablet by mouth daily.  02/24/17  Yes  [provider]  Clindamycin-Benzoyl Per, Refr, gel Apply 1 application topically daily. 09/02/20  Yes [provider]  diphenoxylate-atropine (LOMOTIL) 2.5-0.025 MG tablet Take 1 tablet by mouth every 8 (eight) hours as needed for diarrhea or loose stools. 02/16/21  Yes [provider]  escitalopram (LEXAPRO) 20 MG tablet Take 1 tablet by mouth daily. 02/16/21  Yes [provider]  folic acid (FOLVITE) 1 MG tablet Take 1 mg by mouth daily. 10/03/19  Yes [provider]  furosemide (LASIX) 40 MG tablet Take 40 mg by mouth daily as needed for fluid.  02/27/20  Yes [provider]  haloperidol (HALDOL) 5 MG tablet Take 5 mg by mouth daily.  06/19/19  Yes [provider]  ibuprofen (ADVIL) 800 MG tablet Take 800 mg by mouth daily as needed for pain. 02/02/21  Yes [provider]  lurasidone 120 MG TABS Take 1 tablet (120 mg total) by mouth daily. 05/15/15  Yes Niel Hummer, NP  meclizine (ANTIVERT) 25 MG tablet Take 25 mg by mouth every 8 (eight) hours as needed for dizziness.  03/02/20  Yes [provider]  ondansetron (ZOFRAN) 8 MG tablet Take 8 mg by mouth every 8 (eight) hours as needed for nausea/vomiting, nausea or vomiting. 10/03/19  Yes [provider]  thiamine (VITAMIN B-1) 100 MG tablet Take 100 mg by mouth daily.  01/31/19  Yes [provider]  topiramate (TOPAMAX) 100 MG tablet Take 1 tablet (100 mg total) by mouth at bedtime. 08/06/20  Yes Suzzanne Cloud, NP  traZODone (DESYREL) 300 MG tablet Take 1 tablet (300 mg total) by mouth at bedtime as needed for sleep. Patient taking differently: Take 1,200 mg by mouth at bedtime. 05/15/15  Yes Niel Hummer, NP  valACYclovir (VALTREX) 1000 MG tablet Take 1 tablet (1,000 mg total) by mouth daily. 05/15/15  Yes Niel Hummer, NP  benzonatate (TESSALON) 100 MG capsule Take 200 mg by mouth every 8 (eight) hours as needed for cough. 10/23/19   [provider]  cetirizine (ZYRTEC) 10 MG tablet Take 1 tablet (10 mg total) by mouth daily. Patient taking differently: Take 10 mg by mouth daily as needed for allergies. 05/15/15   Niel Hummer, NP  famotidine (PEPCID) 40 MG tablet Take 40 mg by mouth daily.    [provider]  hydrOXYzine (ATARAX/VISTARIL) 50 MG tablet Take 1 tablet (50 mg total) by mouth every 6 (six) hours as needed for anxiety. 07/25/18   Pennelope Bracken, MD  pantoprazole (PROTONIX) 40 MG tablet Take 40 mg by mouth every morning. 03/02/20   [provider]  zolpidem (AMBIEN) 10 MG tablet Take 10 mg by  mouth at bedtime. 09/06/19   [provider]    Scheduled Meds: Continuous Infusions: . sodium chloride    . lactated ringers 10 mL/hr at 02/26/21 0955   PRN Meds:.lactated ringers  Allergies as of 01/13/2021 - Review Complete 12/03/2020  Allergen Reaction Noted  . Dapsone Other (See Comments) 09/08/2011  . Primaquine phosphate Other (See Comments) 09/08/2011    Family History  Problem Relation Age of Onset  . Alcohol abuse Mother   . Schizophrenia Father   . Depression Father   . Alcohol abuse Father   . Alcohol abuse Paternal Uncle   . Alcohol abuse Paternal Uncle     Social History   Socioeconomic History  . Marital status: Single    Spouse name: Not on file  . Number of children: Not  on file  . Years of education: Not on file  . Highest education level: Not on file  Occupational History  . Occupation: [employer]  Tobacco Use  . Smoking status: Current Every Day Smoker    Packs/day: 1.00    Types: Cigarettes  . Smokeless tobacco: Never Used  Vaping Use  . Vaping Use: Never used  Substance and Sexual Activity  . Alcohol use: Yes    Alcohol/week: 14.0 standard drinks    Types: 14 Cans of beer per week    Comment: 5-6 40oz per day  . Drug use: No  . Sexual activity: Not Currently    Birth control/protection: Condom  Other Topics Concern  . Not on file  Social History Narrative   Lives at home alone   Right-handed   Drinks 2-3 sodas per day   Social Determinants of Health   Financial Resource Strain: Not on file  Food Insecurity: Not on file  Transportation Needs: Not on file  Physical Activity: Not on file  Stress: Not on file  Social Connections: Not on file  Intimate Partner Violence: Not on file     Physical Exam: Vital signs: Vitals:   02/26/21 0936  BP: 110/69  Pulse: 88  Resp: 16  Temp: 98 F (36.7 C)  SpO2: 99%     General:   Alert,  Well-developed, well-nourished, pleasant and cooperative in NAD Lungs:  Clear  throughout to auscultation.   No wheezes, crackles, or rhonchi. No acute distress. Heart:  Regular rate and rhythm; no murmurs, clicks, rubs,  or gallops. Abdomen: Soft, nontender, nondistended, bowel sounds present.  No peritoneal sign Rectal:  Deferred  GI:  Lab Results: No results for input(s): WBC, HGB, HCT, PLT in the last 72 hours. BMET No results for input(s): NA, K, CL, CO2, GLUCOSE, BUN, CREATININE, CALCIUM in the last 72 hours. LFT No results for input(s): PROT, ALBUMIN, AST, ALT, ALKPHOS, BILITOT, BILIDIR, IBILI in the last 72 hours. PT/INR No results for input(s): LABPROT, INR in the last 72 hours.   Studies/Results: No results found.  Impression/Plan: -Rectal bleeding - Occult blood Positive stool -Diarrhea alternating with constipation  Recommendations ------------------------- - EGD and colonoscopy today.  Risks (bleeding, infection, bowel perforation that could require surgery, sedation-related changes in cardiopulmonary systems), benefits (identification and possible treatment of source of symptoms, exclusion of certain causes of symptoms), and alternatives (watchful waiting, radiographic imaging studies, empiric medical treatment)  were explained to patient/family in detail and patient wishes to proceed.    LOS: 0 days   Otis Brace  MD, FACP 02/26/2021, 9:59 AM  Contact #  [phone]

## 2021-02-26 NOTE — Op Note (Signed)
[facility] Patient Name: [name] Procedure Date: 02/26/2021 MRN: [id number] Attending MD: Otis Brace , MD Date of Birth: October 13, 1985 CSN: [id number] Age: 35 Admit Type: Outpatient Procedure:                Colonoscopy Indications:              This is the patient's first colonoscopy, Rectal                            bleeding Providers:                Otis Brace, MD, Nelia Shi, RN,                            Tyrone Apple, Technician, Danley Danker, CRNA Referring MD:              Medicines:                Sedation Administered by an Anesthesia Professional Complications:            No immediate complications. Estimated Blood Loss:     Estimated blood loss: none. Procedure:                Pre-Anesthesia Assessment:                           - Prior to the procedure, a History and Physical                            was performed, and patient medications and                            allergies were reviewed. The patient's tolerance of                            previous anesthesia was also reviewed. The risks                            and benefits of the procedure and the sedation                            options and risks were discussed with the patient.                            All questions were answered, and informed consent                            was obtained. Prior Anticoagulants: The patient has                            taken no previous anticoagulant or antiplatelet                            agents. ASA Grade Assessment: III - A patient with  severe systemic disease. After reviewing the risks                            and benefits, the patient was deemed in                            satisfactory condition to undergo the procedure.                           After obtaining informed consent, the colonoscope                            was passed under direct vision. Throughout the                             procedure, the patient's blood pressure, pulse, and                            oxygen saturations were monitored continuously. The                            PCF-H190DL ([id number]) Olympus pediatric colonscope                            was introduced through the anus and advanced to the                            the terminal ileum, with identification of the                            appendiceal orifice and IC valve. The colonoscopy                            was performed without difficulty. The patient                            tolerated the procedure well. The quality of the                            bowel preparation was good except the sigmoid colon                            was fair. Scope In: 10:12:16 AM Scope Out: 10:23:49 AM Scope Withdrawal Time: 0 hours 7 minutes 46 seconds  Total Procedure Duration: 0 hours 11 minutes 33 seconds  Findings:      The perianal exam findings include skin lesions with active bleeding.      The terminal ileum appeared normal.      Normal mucosa was found in the entire colon. Biopsies for histology were       taken with a cold forceps from the ascending colon and descending colon       for evaluation of microscopic colitis.      Internal hemorrhoids were found during retroflexion. The hemorrhoids       were medium-sized. Impression:               -  Skin lesions with active bleeding. Found on                            perianal exam.                           - The examined portion of the ileum was normal.                           - Normal mucosa in the entire examined colon.                            Biopsied.                           - Internal hemorrhoids. Moderate Sedation:      Moderate (conscious) sedation was personally administered by an       anesthesia professional. The following parameters were monitored: oxygen       saturation, heart rate, blood pressure, and response to care. Recommendation:           - Patient  has a contact number available for                            emergencies. The signs and symptoms of potential                            delayed complications were discussed with the                            patient. Return to normal activities tomorrow.                            Written discharge instructions were provided to the                            patient.                           - Resume previous diet.                           - Continue present medications.                           - Await pathology results.                           - Repeat colonoscopy in 5 years for screening                            purposes.                           - Return to my office as previously scheduled. Procedure Code(s):        --- Professional ---  45380, Colonoscopy, flexible; with biopsy, single                            or multiple Diagnosis Code(s):        --- Professional ---                           K64.8, Other hemorrhoids                           K62.5, Hemorrhage of anus and rectum CPT copyright 2019 [employer] Association. All rights reserved. The codes documented in this report are preliminary and upon coder review may  be revised to meet current compliance requirements. Otis Brace, MD Otis Brace, MD 02/26/2021 10:39:33 AM Number of Addenda: 0

## 2021-02-26 NOTE — Transfer of Care (Signed)
Immediate Anesthesia Transfer of Care Note  Patient: [name]  Procedure(s) Performed: COLONOSCOPY WITH PROPOFOL ([name] ) ESOPHAGOGASTRODUODENOSCOPY (EGD) WITH PROPOFOL ([name] ) BIOPSY  Patient Location: Endoscopy Unit  Anesthesia Type:MAC  Level of Consciousness: awake  Airway & Oxygen Therapy: Patient Spontanous Breathing  Post-op Assessment: Report given to RN and Post -op Vital signs reviewed and stable  Post vital signs: Reviewed and stable  Last Vitals:  Vitals Value Taken Time  BP    Temp    Pulse    Resp    SpO2      Last Pain:  Vitals:   02/26/21 0936  TempSrc: Oral  PainSc:          Complications: No complications documented.

## 2021-02-26 NOTE — Anesthesia Postprocedure Evaluation (Signed)
Anesthesia Post Note  Patient: [name]  Procedure(s) Performed: COLONOSCOPY WITH PROPOFOL ([name] ) ESOPHAGOGASTRODUODENOSCOPY (EGD) WITH PROPOFOL ([name] ) BIOPSY     Patient location during evaluation: Endoscopy Anesthesia Type: MAC Level of consciousness: awake and alert Pain management: pain level controlled Vital Signs Assessment: post-procedure vital signs reviewed and stable Respiratory status: spontaneous breathing, nonlabored ventilation, respiratory function stable and patient connected to nasal cannula oxygen Cardiovascular status: stable and blood pressure returned to baseline Postop Assessment: no apparent nausea or vomiting Anesthetic complications: no   No complications documented.  Last Vitals:  Vitals:   02/26/21 1042 02/26/21 1052  BP: 119/78 128/76  Pulse: 94 97  Resp: 18 (!) 21  Temp:    SpO2: 96% 92%    Last Pain:  Vitals:   02/26/21 1052  TempSrc:   PainSc: 0-No pain                 Barnet Glasgow

## 2021-03-01 ENCOUNTER — Encounter (HOSPITAL_COMMUNITY): Payer: Self-pay | Admitting: Gastroenterology

## 2021-03-01 LAB — SURGICAL PATHOLOGY

## 2021-03-02 ENCOUNTER — Other Ambulatory Visit: Payer: Self-pay | Admitting: Internal Medicine

## 2021-03-03 LAB — URINE CULTURE
MICRO NUMBER:: 11928310
SPECIMEN QUALITY:: ADEQUATE

## 2021-03-18 ENCOUNTER — Other Ambulatory Visit: Payer: Self-pay | Admitting: Neurology

## 2021-04-29 ENCOUNTER — Encounter: Payer: Self-pay | Admitting: Neurology

## 2021-04-29 ENCOUNTER — Ambulatory Visit: Payer: Self-pay | Admitting: Neurology

## 2021-04-29 ENCOUNTER — Telehealth: Payer: Self-pay | Admitting: Neurology

## 2021-04-29 NOTE — Telephone Encounter (Signed)
Pt would like to know who is being advised to see in the practice when Dr Jamey Ripa, please call.

## 2021-04-29 NOTE — Telephone Encounter (Signed)
This is the second revisit no-show for this patient in the last 12 months, he did not show on 28 October 2020.

## 2021-04-29 NOTE — Telephone Encounter (Signed)
FYI pt was called, he was offered Dr Rhea Belton 1st available afternoon appointment, pt is also on the wait list.

## 2021-05-05 NOTE — Patient Instructions (Addendum)
DUE TO COVID-19 ONLY ONE VISITOR IS ALLOWED TO COME WITH YOU AND STAY IN THE WAITING ROOM ONLY DURING PRE OP AND PROCEDURE DAY OF SURGERY.   TWO VISITOR  MAY VISIT WITH YOU AFTER SURGERY IN YOUR PRIVATE ROOM DURING VISITING HOURS ONLY!  10am-8pm  YOU NEED TO HAVE A COVID 19 TEST ON__8-5-22_____ @_______ , THIS TEST MUST BE DONE BEFORE SURGERY,    COVID TESTING SITE   [place] ,[place] TEST IS COMPLETED,  PLEASE Wear a mask when in public           Your procedure is scheduled on: 05-18-21   Report to [facility] Main  Entrance   Report to admitting at      1:15 PM      Call this number if you have problems the morning of surgery [phone]    Remember: NO SOLID FOOD AFTER MIDNIGHT THE NIGHT PRIOR TO SURGERY. NOTHING BY MOUTH EXCEPT CLEAR LIQUIDS UNTIL  1245 pm   . PLEASE FINISH ENSURE DRINK PER SURGEON ORDER  WHICH NEEDS TO BE COMPLETED AT     1245 pm then nothing by mouth .     CLEAR LIQUID DIET   Foods Allowed                                                                     Foods Excluded water Black Coffee and tea, regular and decaf                             liquids that you cannot  Plain Jell-O any favor except red or purple                                           see through such as: Fruit ices (not with fruit pulp)                                                   milk, soups, orange juice  Iced Popsicles                                                   All solid food Carbonated beverages, regular and diet                                    Cranberry, grape and apple juices Sports drinks like Gatorade Lightly seasoned clear broth or consume(fat free) Sugar, honey syrup  _____________________________________________________________________     BRUSH YOUR TEETH MORNING OF SURGERY AND RINSE YOUR MOUTH OUT, NO CHEWING GUM CANDY OR MINTS.     Take these medicines the morning of surgery with A SIP OF WATER:  You may not have any metal on your body including hair pins and              piercings  Do not wear jewelry, make-up, lotions, powders or perfumes, deodorant             Do not wear nail polish on your fingernails or toenails .  Do not shave  48 hours prior to surgery.              Men may shave face and neck.   Do not bring valuables to the hospital. [place].  Contacts, dentures or bridgework may not be worn into surgery.       Patients discharged the day of surgery will not be allowed to drive home. IF YOU ARE HAVING SURGERY AND GOING HOME THE SAME DAY, YOU MUST HAVE AN ADULT TO DRIVE YOU HOME AND BE WITH YOU FOR 24 HOURS. YOU MAY GO HOME BY TAXI OR UBER OR ORTHERWISE, BUT AN ADULT MUST ACCOMPANY YOU HOME AND STAY WITH YOU FOR 24 HOURS.  Name and phone number of your driver:  Special Instructions: [name]              Please read over the following fact sheets you were given: _____________________________________________________________________             [facility] - Preparing for Surgery Before surgery, you can play an important role.  Because skin is not sterile, your skin needs to be as free of germs as possible.  You can reduce the number of germs on your skin by washing with CHG (chlorahexidine gluconate) soap before surgery.  CHG is an antiseptic cleaner which kills germs and bonds with the skin to continue killing germs even after washing. Please DO NOT use if you have an allergy to CHG or antibacterial soaps.  If your skin becomes reddened/irritated stop using the CHG and inform your nurse when you arrive at Short Stay. Do not shave (including legs and underarms) for at least 48 hours prior to the first CHG shower.  You may shave your face/neck. Please follow these instructions carefully:  1.  Shower with CHG Soap the night before surgery and the  morning of Surgery.  2.  If you choose to wash your hair,  wash your hair first as usual with your  normal  shampoo.  3.  After you shampoo, rinse your hair and body thoroughly to remove the  shampoo.                           4.  Use CHG as you would any other liquid soap.  You can apply chg directly  to the skin and wash                       Gently with a scrungie or clean washcloth.  5.  Apply the CHG Soap to your body ONLY FROM THE NECK DOWN.   Do not use on face/ open                           Wound or open sores. Avoid contact with eyes, ears mouth and genitals (private parts).  Wash face,  Genitals (private parts) with your normal soap.             6.  Wash thoroughly, paying special attention to the area where your surgery  will be performed.  7.  Thoroughly rinse your body with warm water from the neck down.  8.  DO NOT shower/wash with your normal soap after using and rinsing off  the CHG Soap.                9.  Pat yourself dry with a clean towel.            10.  Wear clean pajamas.            11.  Place clean sheets on your bed the night of your first shower and do not  sleep with pets. Day of Surgery : Do not apply any lotions/deodorants the morning of surgery.  Please wear clean clothes to the hospital/surgery center.  FAILURE TO FOLLOW THESE INSTRUCTIONS MAY RESULT IN THE CANCELLATION OF YOUR SURGERY PATIENT SIGNATURE_________________________________  NURSE SIGNATURE__________________________________  ____  Incentive Spirometer  An incentive spirometer is a tool that can help keep your lungs clear and active. This tool measures how well you are filling your lungs with each breath. Taking long deep breaths may help reverse or decrease the chance of developing breathing (pulmonary) problems (especially infection) following: A long period of time when you are unable to move or be active. BEFORE THE PROCEDURE  If the spirometer includes an indicator to show your best effort, your nurse or respiratory therapist  will set it to a desired goal. If possible, sit up straight or lean slightly forward. Try not to slouch. Hold the incentive spirometer in an upright position. INSTRUCTIONS FOR USE  Sit on the edge of your bed if possible, or sit up as far as you can in bed or on a chair. Hold the incentive spirometer in an upright position. Breathe out normally. Place the mouthpiece in your mouth and seal your lips tightly around it. Breathe in slowly and as deeply as possible, raising the piston or the ball toward the top of the column. Hold your breath for 3-5 seconds or for as long as possible. Allow the piston or ball to fall to the bottom of the column. Remove the mouthpiece from your mouth and breathe out normally. Rest for a few seconds and repeat Steps 1 through 7 at least 10 times every 1-2 hours when you are awake. Take your time and take a few normal breaths between deep breaths. The spirometer may include an indicator to show your best effort. Use the indicator as a goal to work toward during each repetition. After each set of 10 deep breaths, practice coughing to be sure your lungs are clear. If you have an incision (the cut made at the time of surgery), support your incision when coughing by placing a pillow or rolled up towels firmly against it. Once you are able to get out of bed, walk around indoors and cough well. You may stop using the incentive spirometer when instructed by your caregiver.  RISKS AND COMPLICATIONS Take your time so you do not get dizzy or light-headed. If you are in pain, you may need to take or ask for pain medication before doing incentive spirometry. It is harder to take a deep breath if you are having pain. AFTER USE Rest and breathe slowly and easily. It can be helpful to keep track of a  log of your progress. Your caregiver can provide you with a simple table to help with this. If you are using the spirometer at home, follow these instructions: [place] IF:   You are having difficultly using the spirometer. You have trouble using the spirometer as often as instructed. Your pain medication is not giving enough relief while using the spirometer. You develop fever of 100.5 F (38.1 C) or higher. SEEK IMMEDIATE MEDICAL CARE IF:  You cough up bloody sputum that had not been present before. You develop fever of 102 F (38.9 C) or greater. You develop worsening pain at or near the incision site. MAKE SURE YOU:  Understand these instructions. Will watch your condition. Will get help right away if you are not doing well or get worse. Document Released: 02/06/2007 Document Revised: 12/19/2011 Document Reviewed: 04/09/2007 [facility] Patient Information 2014 [employer], [place].   ________________________________________________________________________ ____________________________________________________________________

## 2021-05-05 NOTE — Progress Notes (Signed)
PCP -  Cardiologist -   PPM/ICD -  Device Orders -  Rep Notified -   Chest x-ray - 1v  03-15-2020 EKG - 03-16-20 Stress Test -  ECHO - 11-01-19 Cardiac Cath -   Sleep Study -  CPAP -   Fasting Blood Sugar -  Checks Blood Sugar _____ times a day  Blood Thinner Instructions: Aspirin Instructions:  ERAS Protcol - PRE-SURGERY Ensure or G2-   COVID TEST- 05-14-21  Activity-- Anesthesia review: HIV , PTSD,Schizoaffective disorder  Patient denies shortness of breath, fever, cough and chest pain at PAT appointment   All instructions explained to the patient, with a verbal understanding of the material. Patient agrees to go over the instructions while at home for a better understanding. Patient also instructed to self quarantine after being tested for COVID-19. The opportunity to ask questions was provided.

## 2021-05-07 ENCOUNTER — Inpatient Hospital Stay (HOSPITAL_COMMUNITY): Payer: Medicare HMO

## 2021-05-07 ENCOUNTER — Encounter (HOSPITAL_COMMUNITY): Payer: Self-pay | Admitting: Emergency Medicine

## 2021-05-07 ENCOUNTER — Emergency Department (HOSPITAL_COMMUNITY): Payer: Medicare HMO

## 2021-05-07 ENCOUNTER — Inpatient Hospital Stay (HOSPITAL_COMMUNITY)
Admission: EM | Admit: 2021-05-07 | Discharge: 2021-05-19 | DRG: 602 | Disposition: A | Discharge status: Home Health | Payer: Medicare HMO | Attending: Internal Medicine | Admitting: Internal Medicine

## 2021-05-07 ENCOUNTER — Other Ambulatory Visit: Payer: Self-pay

## 2021-05-07 DIAGNOSIS — S71102A Unspecified open wound, left thigh, initial encounter: Secondary | ICD-10-CM | POA: Diagnosis present

## 2021-05-07 DIAGNOSIS — R7989 Other specified abnormal findings of blood chemistry: Secondary | ICD-10-CM | POA: Diagnosis present

## 2021-05-07 DIAGNOSIS — E512 Wernicke's encephalopathy: Secondary | ICD-10-CM | POA: Diagnosis present

## 2021-05-07 DIAGNOSIS — G40909 Epilepsy, unspecified, not intractable, without status epilepticus: Secondary | ICD-10-CM | POA: Diagnosis present

## 2021-05-07 DIAGNOSIS — Z6831 Body mass index (BMI) 31.0-31.9, adult: Secondary | ICD-10-CM | POA: Diagnosis not present

## 2021-05-07 DIAGNOSIS — D649 Anemia, unspecified: Secondary | ICD-10-CM | POA: Diagnosis not present

## 2021-05-07 DIAGNOSIS — G8929 Other chronic pain: Secondary | ICD-10-CM | POA: Diagnosis present

## 2021-05-07 DIAGNOSIS — Z9114 Patient's other noncompliance with medication regimen: Secondary | ICD-10-CM

## 2021-05-07 DIAGNOSIS — M879 Osteonecrosis, unspecified: Secondary | ICD-10-CM | POA: Diagnosis present

## 2021-05-07 DIAGNOSIS — M87852 Other osteonecrosis, left femur: Secondary | ICD-10-CM | POA: Diagnosis present

## 2021-05-07 DIAGNOSIS — I5030 Unspecified diastolic (congestive) heart failure: Secondary | ICD-10-CM | POA: Diagnosis not present

## 2021-05-07 DIAGNOSIS — I10 Essential (primary) hypertension: Secondary | ICD-10-CM | POA: Diagnosis present

## 2021-05-07 DIAGNOSIS — A6002 Herpesviral infection of other male genital organs: Secondary | ICD-10-CM | POA: Diagnosis present

## 2021-05-07 DIAGNOSIS — L03116 Cellulitis of left lower limb: Secondary | ICD-10-CM | POA: Diagnosis present

## 2021-05-07 DIAGNOSIS — F319 Bipolar disorder, unspecified: Secondary | ICD-10-CM | POA: Diagnosis present

## 2021-05-07 DIAGNOSIS — M25552 Pain in left hip: Secondary | ICD-10-CM | POA: Diagnosis not present

## 2021-05-07 DIAGNOSIS — B20 Human immunodeficiency virus [HIV] disease: Secondary | ICD-10-CM | POA: Diagnosis not present

## 2021-05-07 DIAGNOSIS — F17210 Nicotine dependence, cigarettes, uncomplicated: Secondary | ICD-10-CM | POA: Diagnosis present

## 2021-05-07 DIAGNOSIS — K2970 Gastritis, unspecified, without bleeding: Secondary | ICD-10-CM | POA: Diagnosis present

## 2021-05-07 DIAGNOSIS — L03317 Cellulitis of buttock: Secondary | ICD-10-CM | POA: Diagnosis present

## 2021-05-07 DIAGNOSIS — E669 Obesity, unspecified: Secondary | ICD-10-CM | POA: Diagnosis present

## 2021-05-07 DIAGNOSIS — M7989 Other specified soft tissue disorders: Secondary | ICD-10-CM | POA: Diagnosis present

## 2021-05-07 DIAGNOSIS — K760 Fatty (change of) liver, not elsewhere classified: Secondary | ICD-10-CM | POA: Diagnosis present

## 2021-05-07 DIAGNOSIS — N179 Acute kidney failure, unspecified: Secondary | ICD-10-CM | POA: Diagnosis present

## 2021-05-07 DIAGNOSIS — R296 Repeated falls: Secondary | ICD-10-CM | POA: Diagnosis present

## 2021-05-07 DIAGNOSIS — S31829A Unspecified open wound of left buttock, initial encounter: Secondary | ICD-10-CM | POA: Diagnosis present

## 2021-05-07 DIAGNOSIS — D539 Nutritional anemia, unspecified: Secondary | ICD-10-CM | POA: Diagnosis present

## 2021-05-07 DIAGNOSIS — F10239 Alcohol dependence with withdrawal, unspecified: Secondary | ICD-10-CM | POA: Diagnosis not present

## 2021-05-07 DIAGNOSIS — K259 Gastric ulcer, unspecified as acute or chronic, without hemorrhage or perforation: Secondary | ICD-10-CM | POA: Diagnosis present

## 2021-05-07 DIAGNOSIS — D638 Anemia in other chronic diseases classified elsewhere: Secondary | ICD-10-CM | POA: Diagnosis present

## 2021-05-07 DIAGNOSIS — S71101A Unspecified open wound, right thigh, initial encounter: Secondary | ICD-10-CM | POA: Diagnosis present

## 2021-05-07 DIAGNOSIS — D509 Iron deficiency anemia, unspecified: Secondary | ICD-10-CM | POA: Diagnosis present

## 2021-05-07 DIAGNOSIS — R7401 Elevation of levels of liver transaminase levels: Secondary | ICD-10-CM

## 2021-05-07 DIAGNOSIS — D6959 Other secondary thrombocytopenia: Secondary | ICD-10-CM | POA: Diagnosis present

## 2021-05-07 DIAGNOSIS — K449 Diaphragmatic hernia without obstruction or gangrene: Secondary | ICD-10-CM | POA: Diagnosis present

## 2021-05-07 DIAGNOSIS — F1020 Alcohol dependence, uncomplicated: Secondary | ICD-10-CM | POA: Diagnosis present

## 2021-05-07 DIAGNOSIS — K222 Esophageal obstruction: Secondary | ICD-10-CM | POA: Diagnosis present

## 2021-05-07 DIAGNOSIS — R7881 Bacteremia: Secondary | ICD-10-CM | POA: Diagnosis not present

## 2021-05-07 DIAGNOSIS — R609 Edema, unspecified: Secondary | ICD-10-CM | POA: Diagnosis not present

## 2021-05-07 DIAGNOSIS — Z888 Allergy status to other drugs, medicaments and biological substances status: Secondary | ICD-10-CM

## 2021-05-07 DIAGNOSIS — E8809 Other disorders of plasma-protein metabolism, not elsewhere classified: Secondary | ICD-10-CM | POA: Diagnosis present

## 2021-05-07 DIAGNOSIS — L03115 Cellulitis of right lower limb: Principal | ICD-10-CM | POA: Diagnosis present

## 2021-05-07 DIAGNOSIS — Z811 Family history of alcohol abuse and dependence: Secondary | ICD-10-CM

## 2021-05-07 DIAGNOSIS — L03119 Cellulitis of unspecified part of limb: Secondary | ICD-10-CM | POA: Diagnosis not present

## 2021-05-07 DIAGNOSIS — Z20822 Contact with and (suspected) exposure to covid-19: Secondary | ICD-10-CM | POA: Diagnosis present

## 2021-05-07 DIAGNOSIS — E8770 Fluid overload, unspecified: Secondary | ICD-10-CM | POA: Diagnosis not present

## 2021-05-07 DIAGNOSIS — Z818 Family history of other mental and behavioral disorders: Secondary | ICD-10-CM

## 2021-05-07 DIAGNOSIS — K2100 Gastro-esophageal reflux disease with esophagitis, without bleeding: Secondary | ICD-10-CM | POA: Diagnosis not present

## 2021-05-07 DIAGNOSIS — F251 Schizoaffective disorder, depressive type: Secondary | ICD-10-CM | POA: Diagnosis present

## 2021-05-07 DIAGNOSIS — F4310 Post-traumatic stress disorder, unspecified: Secondary | ICD-10-CM | POA: Diagnosis present

## 2021-05-07 DIAGNOSIS — Z9119 Patient's noncompliance with other medical treatment and regimen: Secondary | ICD-10-CM

## 2021-05-07 DIAGNOSIS — L0390 Cellulitis, unspecified: Secondary | ICD-10-CM | POA: Diagnosis present

## 2021-05-07 DIAGNOSIS — G9341 Metabolic encephalopathy: Secondary | ICD-10-CM | POA: Diagnosis not present

## 2021-05-07 DIAGNOSIS — E876 Hypokalemia: Secondary | ICD-10-CM | POA: Diagnosis present

## 2021-05-07 DIAGNOSIS — D510 Vitamin B12 deficiency anemia due to intrinsic factor deficiency: Secondary | ICD-10-CM | POA: Diagnosis not present

## 2021-05-07 DIAGNOSIS — G43909 Migraine, unspecified, not intractable, without status migrainosus: Secondary | ICD-10-CM | POA: Diagnosis present

## 2021-05-07 DIAGNOSIS — G629 Polyneuropathy, unspecified: Secondary | ICD-10-CM | POA: Diagnosis present

## 2021-05-07 DIAGNOSIS — Z79899 Other long term (current) drug therapy: Secondary | ICD-10-CM

## 2021-05-07 DIAGNOSIS — K219 Gastro-esophageal reflux disease without esophagitis: Secondary | ICD-10-CM | POA: Diagnosis present

## 2021-05-07 DIAGNOSIS — S31819A Unspecified open wound of right buttock, initial encounter: Secondary | ICD-10-CM | POA: Diagnosis present

## 2021-05-07 LAB — URINALYSIS, ROUTINE W REFLEX MICROSCOPIC
Bilirubin Urine: NEGATIVE
Glucose, UA: NEGATIVE mg/dL
Ketones, ur: 5 mg/dL — AB
Nitrite: POSITIVE — AB
Protein, ur: NEGATIVE mg/dL
Specific Gravity, Urine: 1.005 (ref 1.005–1.030)
pH: 6 (ref 5.0–8.0)

## 2021-05-07 LAB — IRON AND TIBC
Iron: 69 ug/dL (ref 45–182)
Saturation Ratios: 70 % — ABNORMAL HIGH (ref 17.9–39.5)
TIBC: 98 ug/dL — ABNORMAL LOW (ref 250–450)
UIBC: 29 ug/dL

## 2021-05-07 LAB — CBC
HCT: 22.3 % — ABNORMAL LOW (ref 39.0–52.0)
Hemoglobin: 7.2 g/dL — ABNORMAL LOW (ref 13.0–17.0)
MCH: 34.8 pg — ABNORMAL HIGH (ref 26.0–34.0)
MCHC: 32.3 g/dL (ref 30.0–36.0)
MCV: 107.7 fL — ABNORMAL HIGH (ref 80.0–100.0)
Platelets: 122 10*3/uL — ABNORMAL LOW (ref 150–400)
RBC: 2.07 MIL/uL — ABNORMAL LOW (ref 4.22–5.81)
RDW: 15 % (ref 11.5–15.5)
WBC: 18.3 10*3/uL — ABNORMAL HIGH (ref 4.0–10.5)
nRBC: 0 % (ref 0.0–0.2)

## 2021-05-07 LAB — BRAIN NATRIURETIC PEPTIDE: B Natriuretic Peptide: 87.8 pg/mL (ref 0.0–100.0)

## 2021-05-07 LAB — CBC WITH DIFFERENTIAL/PLATELET
Abs Immature Granulocytes: 0.53 10*3/uL — ABNORMAL HIGH (ref 0.00–0.07)
Basophils Absolute: 0.1 10*3/uL (ref 0.0–0.1)
Basophils Relative: 0 %
Eosinophils Absolute: 0.1 10*3/uL (ref 0.0–0.5)
Eosinophils Relative: 0 %
HCT: 23.8 % — ABNORMAL LOW (ref 39.0–52.0)
Hemoglobin: 7.6 g/dL — ABNORMAL LOW (ref 13.0–17.0)
Immature Granulocytes: 3 %
Lymphocytes Relative: 7 %
Lymphs Abs: 1.3 10*3/uL (ref 0.7–4.0)
MCH: 34.1 pg — ABNORMAL HIGH (ref 26.0–34.0)
MCHC: 31.9 g/dL (ref 30.0–36.0)
MCV: 106.7 fL — ABNORMAL HIGH (ref 80.0–100.0)
Monocytes Absolute: 1.2 10*3/uL — ABNORMAL HIGH (ref 0.1–1.0)
Monocytes Relative: 6 %
Neutro Abs: 15.3 10*3/uL — ABNORMAL HIGH (ref 1.7–7.7)
Neutrophils Relative %: 84 %
Platelets: 142 10*3/uL — ABNORMAL LOW (ref 150–400)
RBC: 2.23 MIL/uL — ABNORMAL LOW (ref 4.22–5.81)
RDW: 14.6 % (ref 11.5–15.5)
WBC: 18.5 10*3/uL — ABNORMAL HIGH (ref 4.0–10.5)
nRBC: 0 % (ref 0.0–0.2)

## 2021-05-07 LAB — COMPREHENSIVE METABOLIC PANEL
ALT: 28 U/L (ref 0–44)
AST: 43 U/L — ABNORMAL HIGH (ref 15–41)
Albumin: 1.9 g/dL — ABNORMAL LOW (ref 3.5–5.0)
Alkaline Phosphatase: 209 U/L — ABNORMAL HIGH (ref 38–126)
Anion gap: 8 (ref 5–15)
BUN: 13 mg/dL (ref 6–20)
CO2: 23 mmol/L (ref 22–32)
Calcium: 8.6 mg/dL — ABNORMAL LOW (ref 8.9–10.3)
Chloride: 103 mmol/L (ref 98–111)
Creatinine, Ser: 1.32 mg/dL — ABNORMAL HIGH (ref 0.61–1.24)
GFR, Estimated: >60 mL/min (ref >60)
Glucose, Bld: 98 mg/dL (ref 70–99)
Potassium: 3.8 mmol/L (ref 3.5–5.1)
Sodium: 134 mmol/L — ABNORMAL LOW (ref 135–145)
Total Bilirubin: 1 mg/dL (ref 0.3–1.2)
Total Protein: 7.8 g/dL (ref 6.5–8.1)

## 2021-05-07 LAB — RESP PANEL BY RT-PCR (FLU A&B, COVID) ARPGX2
Influenza A by PCR: NEGATIVE
Influenza B by PCR: NEGATIVE
SARS Coronavirus 2 by RT PCR: NEGATIVE

## 2021-05-07 LAB — RETICULOCYTES
Immature Retic Fract: 13.7 % (ref 2.3–15.9)
RBC.: 2.07 MIL/uL — ABNORMAL LOW (ref 4.22–5.81)
Retic Count, Absolute: 68.1 10*3/uL (ref 19.0–186.0)
Retic Ct Pct: 3.3 % — ABNORMAL HIGH (ref 0.4–3.1)

## 2021-05-07 LAB — LACTIC ACID, PLASMA: Lactic Acid, Venous: 0.8 mmol/L (ref 0.5–1.9)

## 2021-05-07 LAB — PROTIME-INR
INR: 1.2 (ref 0.8–1.2)
Prothrombin Time: 15.5 seconds — ABNORMAL HIGH (ref 11.4–15.2)

## 2021-05-07 LAB — FERRITIN: Ferritin: 433 ng/mL — ABNORMAL HIGH (ref 24–336)

## 2021-05-07 LAB — MAGNESIUM: Magnesium: 1.8 mg/dL (ref 1.7–2.4)

## 2021-05-07 LAB — AMMONIA: Ammonia: 30 umol/L (ref 9–35)

## 2021-05-07 LAB — POC OCCULT BLOOD, ED: Fecal Occult Bld: NEGATIVE

## 2021-05-07 LAB — LIPASE, BLOOD: Lipase: 26 U/L (ref 11–51)

## 2021-05-07 LAB — FOLATE: Folate: 7.3 ng/mL (ref >5.9)

## 2021-05-07 LAB — ETHANOL: Alcohol, Ethyl (B): <10 mg/dL (ref <10)

## 2021-05-07 LAB — PROCALCITONIN: Procalcitonin: 4.91 ng/mL

## 2021-05-07 LAB — VITAMIN B12: Vitamin B-12: 2821 pg/mL — ABNORMAL HIGH (ref 180–914)

## 2021-05-07 LAB — TROPONIN I (HIGH SENSITIVITY): Troponin I (High Sensitivity): 2 ng/L (ref <18)

## 2021-05-07 MED ORDER — PANTOPRAZOLE SODIUM 40 MG IV SOLR
40.0000 mg | Freq: Once | INTRAVENOUS | Status: AC
  Administered 2021-05-07: 40 mg via INTRAVENOUS
  Filled 2021-05-07: qty 40

## 2021-05-07 MED ORDER — ACETAMINOPHEN 650 MG RE SUPP: 650.0000 mg | Freq: Four times a day (QID) | RECTAL | Status: DC | PRN

## 2021-05-07 MED ORDER — BICTEGRAVIR-EMTRICITAB-TENOFOV 50-200-25 MG PO TABS
1.0000 | ORAL_TABLET | Freq: Every day | ORAL | Status: DC
  Administered 2021-05-09 – 2021-05-19 (×11): 1 via ORAL
  Filled 2021-05-07 (×12): qty 1

## 2021-05-07 MED ORDER — LORAZEPAM 2 MG/ML IJ SOLN: 1.0000 mg | INTRAMUSCULAR | Status: DC | PRN

## 2021-05-07 MED ORDER — FOLIC ACID 1 MG PO TABS
1.0000 mg | ORAL_TABLET | Freq: Every day | ORAL | Status: DC
  Administered 2021-05-07 – 2021-05-18 (×12): 1 mg via ORAL
  Filled 2021-05-07 (×13): qty 1

## 2021-05-07 MED ORDER — PANTOPRAZOLE SODIUM 40 MG PO TBEC
40.0000 mg | DELAYED_RELEASE_TABLET | Freq: Two times a day (BID) | ORAL | Status: DC
  Administered 2021-05-08: 40 mg via ORAL
  Filled 2021-05-07: qty 1

## 2021-05-07 MED ORDER — HEPARIN SODIUM (PORCINE) 5000 UNIT/ML IJ SOLN
5000.0000 [IU] | Freq: Three times a day (TID) | INTRAMUSCULAR | Status: DC
  Administered 2021-05-07 – 2021-05-15 (×20): 5000 [IU] via SUBCUTANEOUS
  Filled 2021-05-07 (×21): qty 1

## 2021-05-07 MED ORDER — ALBUTEROL SULFATE HFA 108 (90 BASE) MCG/ACT IN AERS: 2.0000 | INHALATION_SPRAY | Freq: Four times a day (QID) | RESPIRATORY_TRACT | Status: DC | PRN

## 2021-05-07 MED ORDER — THIAMINE HCL 100 MG/ML IJ SOLN
500.0000 mg | Freq: Three times a day (TID) | INTRAVENOUS | Status: AC
  Administered 2021-05-07 – 2021-05-10 (×9): 500 mg via INTRAVENOUS
  Filled 2021-05-07 (×10): qty 5

## 2021-05-07 MED ORDER — SODIUM CHLORIDE 0.9 % IV SOLN
2.0000 g | INTRAVENOUS | Status: DC
  Administered 2021-05-07 – 2021-05-12 (×6): 2 g via INTRAVENOUS
  Filled 2021-05-07 (×2): qty 2
  Filled 2021-05-07: qty 20
  Filled 2021-05-07 (×3): qty 2

## 2021-05-07 MED ORDER — LACTATED RINGERS IV SOLN: INTRAVENOUS | Status: AC

## 2021-05-07 MED ORDER — ADULT MULTIVITAMIN W/MINERALS CH
1.0000 | ORAL_TABLET | Freq: Every day | ORAL | Status: DC
  Administered 2021-05-07 – 2021-05-19 (×13): 1 via ORAL
  Filled 2021-05-07 (×13): qty 1

## 2021-05-07 MED ORDER — ACETAMINOPHEN 325 MG PO TABS
650.0000 mg | ORAL_TABLET | Freq: Four times a day (QID) | ORAL | Status: DC | PRN
  Administered 2021-05-08 – 2021-05-16 (×4): 650 mg via ORAL
  Filled 2021-05-07 (×5): qty 2

## 2021-05-07 MED ORDER — LORAZEPAM 1 MG PO TABS: 1.0000 mg | ORAL_TABLET | ORAL | Status: DC | PRN

## 2021-05-07 MED ORDER — VALACYCLOVIR HCL 500 MG PO TABS
1000.0000 mg | ORAL_TABLET | Freq: Every day | ORAL | Status: DC
  Administered 2021-05-07 – 2021-05-19 (×13): 1000 mg via ORAL
  Filled 2021-05-07 (×13): qty 2

## 2021-05-07 NOTE — ED Notes (Signed)
Dr. Irene Pap, [facility] ED floor coverage gave verbal order to switch PO Protonix to IV and order speech/ swallow consultation for patient.

## 2021-05-07 NOTE — ED Notes (Addendum)
Dr. Aileen Fass notified about switching patients PO medication to IV due to trouble swallowing sips of water and pills.

## 2021-05-07 NOTE — H&P (Addendum)
History and Physical        Hospital Admission Note Date: 05/07/2021  Patient name: [name] Medical record number: [id number] Date of birth: Nov 07, 1985 Age: 35 y.o. Gender: male  PCP: [name], MD    Patient coming from: home   I have reviewed all records in the [place].    Chief Complaint:  Altered mental status  HPI: Patient is a 35 year old male with history of HIV, PTSD, schizoaffective disorder, seizures, heavy alcohol abuse, hypertension was brought to ED via EMS for altered mental status.  I was unable to obtain any history from the patient due to his mental status (whispering and rambling).  He was able to tell me his name.  Per EMS and EDP records, patient lives alone.  Patient's family called EMS because he was not taking his medications.  He was laying on the couch for the last 2 weeks, covered in feces and urine.  Patient was surrounded by many alcohol bottles.  He was also noted to have increased lower extremity swelling, sores and redness to the feet.   ED work-up/course:  In ED, temp 97.4, respiratory 13, pulse 87, BP 113/67, O2 sats 100% on room air  CMET showed sodium 134, creatinine 1.3, BUN 13.  Creatinine was 1.1 on 12/03/2020. Alkaline phosphatase 209, albumin 1.9, lipase 26, AST 43, ALT 28 ammonia level pending, total bilirubin 1.0 BNP 87.2 troponin 2, lactic acid 0.8  UA and culture pending, CT head pending.  Review of Systems: Positives marked in 'bold' Unable to obtain review of system from the patient due to his mental status.   Past Medical History: Past Medical History:  Diagnosis Date   Bipolar 1 disorder ([place])    Depression    Dizziness and giddiness 02/01/2016   Herpes genitalia    HIV disease ([place])    Hypertension    Migraine headache 02/01/2016   Peripheral neuropathy 10/01/2019   PTSD (post-traumatic  stress disorder)    Schizoaffective disorder ([place])    Seizures ([place])     Past Surgical History: Past Surgical History:  Procedure Laterality Date   BACK SURGERY     BIOPSY  02/26/2021   Procedure: BIOPSY;  Surgeon: Otis Brace, MD;  Location: [facility];  Service: Gastroenterology;;   COLONOSCOPY WITH PROPOFOL [name] 02/26/2021   Procedure: COLONOSCOPY WITH PROPOFOL;  Surgeon: Otis Brace, MD;  Location: [facility];  Service: Gastroenterology;  Laterality: [name];   ESOPHAGOGASTRODUODENOSCOPY (EGD) WITH PROPOFOL [name] 02/26/2021   Procedure: ESOPHAGOGASTRODUODENOSCOPY (EGD) WITH PROPOFOL;  Surgeon: Otis Brace, MD;  Location: [facility];  Service: Gastroenterology;  Laterality: [name];   HAND SURGERY      Medications: Prior to Admission medications   Medication Sig Start Date End Date Taking? Authorizing Provider  traZODone (DESYREL) 50 MG tablet Take 50 mg by mouth at bedtime.   Yes [provider]  albuterol (PROVENTIL HFA;VENTOLIN HFA) 108 (90 BASE) MCG/ACT inhaler Inhale 2 puffs into the lungs every 6 (six) hours as needed for wheezing or shortness of breath.    [provider]  alprazolam Duanne Moron) 2 MG tablet Take 2 mg by mouth 3 (three) times daily.    [provider]  amoxicillin-clavulanate (AUGMENTIN) 875-125 MG tablet Take 1 tablet by mouth every 12 (twelve) hours. 10/23/20   Hazel Sams, PA-C  benzonatate (TESSALON) 100 MG capsule Take 200 mg by mouth every 8 (eight) hours as needed for cough. 10/23/19   [provider]  bictegravir-emtricitabine-tenofovir AF (BIKTARVY) 50-200-25 MG TABS tablet Take 1 tablet by mouth daily.  02/24/17   [provider]  cetirizine (ZYRTEC) 10 MG tablet Take 1 tablet (10 mg total) by mouth daily. Patient taking differently: Take 10 mg by mouth daily as needed for allergies. 05/15/15   Niel Hummer, NP  Clindamycin-Benzoyl Per, Refr, gel Apply 1 application topically daily. 09/02/20    [provider]  diphenoxylate-atropine (LOMOTIL) 2.5-0.025 MG tablet Take 1 tablet by mouth every 8 (eight) hours as needed for diarrhea or loose stools. 02/16/21   [provider]  escitalopram (LEXAPRO) 20 MG tablet Take 1 tablet by mouth daily. 02/16/21   [provider]  famotidine (PEPCID) 40 MG tablet Take 40 mg by mouth daily.    [provider]  folic acid (FOLVITE) 1 MG tablet Take 1 mg by mouth daily. 10/03/19   [provider]  furosemide (LASIX) 40 MG tablet Take 40 mg by mouth daily as needed for fluid. 02/27/20   [provider]  haloperidol (HALDOL) 5 MG tablet Take 5 mg by mouth daily.  06/19/19   [provider]  hydrOXYzine (ATARAX/VISTARIL) 50 MG tablet Take 1 tablet (50 mg total) by mouth every 6 (six) hours as needed for anxiety. 07/25/18   Pennelope Bracken, MD  lurasidone 120 MG TABS Take 1 tablet (120 mg total) by mouth daily. 05/15/15   Niel Hummer, NP  meclizine (ANTIVERT) 25 MG tablet Take 25 mg by mouth every 8 (eight) hours as needed for dizziness.  03/02/20   [provider]  ondansetron (ZOFRAN) 8 MG tablet Take 8 mg by mouth every 8 (eight) hours as needed for nausea/vomiting, nausea or vomiting. 10/03/19   [provider]  pantoprazole (PROTONIX) 40 MG tablet Take 1 tablet (40 mg total) by mouth 2 (two) times daily. 02/26/21   Otis Brace, MD  thiamine (VITAMIN B-1) 100 MG tablet Take 100 mg by mouth daily.  01/31/19   [provider]  topiramate (TOPAMAX) 100 MG tablet TAKE 1 TABLET (100 MG TOTAL) BY MOUTH AT BEDTIME. 03/18/21   Suzzanne Cloud, NP  traZODone (DESYREL) 300 MG tablet Take 1 tablet (300 mg total) by mouth at bedtime as needed for sleep. Patient taking differently: Take 1,200 mg by mouth at bedtime. 05/15/15   Niel Hummer, NP  valACYclovir (VALTREX) 1000 MG tablet Take 1 tablet (1,000 mg total) by mouth daily. 05/15/15   Niel Hummer, NP  zolpidem (AMBIEN) 10  MG tablet Take 10 mg by mouth at bedtime. 09/06/19   [provider]    Allergies:   Allergies  Allergen Reactions   Dapsone Other (See Comments)    Per centricity "G6PD deficient"   Primaquine Phosphate Other (See Comments)    Per Centricity "G6PD deficient"    Social History: Per records, reports that he has been smoking cigarettes. He has been smoking an average of 1 pack per day. He has never used smokeless tobacco. He reports current alcohol use of about 14.0 standard drinks of alcohol per week. He reports that he does not use drugs.  Family History: Family History  Problem Relation Age of Onset   Alcohol abuse  Mother    Schizophrenia Father    Depression Father    Alcohol abuse Father    Alcohol abuse Paternal Uncle    Alcohol abuse Paternal Uncle     Physical Exam: Blood pressure 118/76, pulse 87, temperature (!) 97.4 F (36.3 C), temperature source Oral, resp. rate 18, SpO2 96 %. General: Alert, awake, whispering and rambling, disoriented. Eyes: pink conjunctiva,anicteric sclera, pupils equal and reactive to light and accomodation, HEENT: normocephalic, atraumatic, oropharynx clear Neck: supple, no masses or lymphadenopathy, no goiter, no bruits, no JVD CVS: Regular rate and rhythm, without murmurs, rubs or gallops.  Resp : Clear to auscultation bilaterally, no wheezing, rales or rhonchi. GI : Soft, nontender, nondistended, positive bowel sounds. No hernia.  Musculoskeletal: Both lower extremities with 2-3+ pedal edema and erythema, sores Neuro: was able to lift up both arms, tremulousness.  Otherwise does not follow commands. Psych: confused, whispering and rambling Skin: no rashes or lesions, warm and dry   LABS on Admission: I have personally reviewed all the labs and imagings below    Basic Metabolic Panel: Recent Labs  Lab 05/07/21 1443  NA 134*  K 3.8  CL 103  CO2 23  GLUCOSE 98  BUN 13  CREATININE 1.32*  CALCIUM 8.6*  MG 1.8   Liver  Function Tests: Recent Labs  Lab 05/07/21 1443  AST 43*  ALT 28  ALKPHOS 209*  BILITOT 1.0  PROT 7.8  ALBUMIN 1.9*   Recent Labs  Lab 05/07/21 1443  LIPASE 26   No results for input(s): AMMONIA in the last 168 hours. CBC: Recent Labs  Lab 05/07/21 1443  WBC 18.5*  NEUTROABS 15.3*  HGB 7.6*  HCT 23.8*  MCV 106.7*  PLT 142*   Cardiac Enzymes: No results for input(s): CKTOTAL, CKMB, CKMBINDEX, TROPONINI in the last 168 hours. BNP: Invalid input(s): POCBNP CBG: No results for input(s): GLUCAP in the last 168 hours.  Radiological Exams on Admission:  DG Chest Port 1 View  Result Date: 05/07/2021 CLINICAL DATA:  Leg swelling EXAM: PORTABLE CHEST 1 VIEW COMPARISON:  Chest x-ray dated March 15, 2020 FINDINGS: Cardiac and mediastinal contours are unchanged. Clear lungs. No pleural effusion or pneumothorax. IMPRESSION: Clear lungs. Electronically Signed   By: Yetta Glassman MD   On: 05/07/2021 16:14      EKG: Independently reviewed.  Rate 85, normal sinus rhythm, T wave inversions in anterior leads   Assessment/Plan Principal Problem:   Acute metabolic encephalopathy: Unclear etiology, possibly Warnicke's encephalopathy/Korsakoff syndrome or hepatic encephalopathy or seizure or due to acute infection/cellulitis, uremia -CT head currently pending, await ammonia level -Patient has not been eating or drinking for unknown period of time, albumin level 1.9, creatinine 1.32 -Has been drinking heavily however alcohol level less than 10, will place on alcohol withdrawal protocol -Placed on thiamine high-dose 500 mg every 8 hours IV for 3 days and assess for clinical improvement  -Follow UA and culture, procalcitonin, blood cultures.  Given lower extremity sores and cellulitis, placed on IV Rocephin.  -TSH in 11/2020 was 1.42  Active Problems: Transaminitis with elevated alk phos, AST, thrombocytopenia: Likely due to alcohol abuse -Obtain abdominal ultrasound to assess for any  cirrhosis -Follow ammonia level  Acute kidney injury -Obtain UA and culture, AKI possibly due to dehydration, poor oral intake, intravascular depletion -Creatinine 1.3 at the time of admission, baseline 1.1 on 12/03/2020 -Placed on gentle IV fluid hydration.  Follow abdominal ultrasound.  HIV disease -Patient has not been taking any of his  antiretrovirals -Once tolerating diet, resume Biktarvy, valacyclovir    Schizoaffective disorder, depressive type ([place]) -Will obtain psychiatry consult for assessment and medication adjustment    Severe alcohol dependence ([place]) with likely acute alcohol withdrawal, possible Warnicke's encephalopathy -Alcohol level <10, although was surrounded by several alcohol bottles, somewhat tremulous - will place on alcohol withdrawal protocol with CIWA -Placed on high-dose thiamine for 3 days, multivitamin, folic acid  Macrocytic anemia, thrombocytopenia -Obtain anemia panel, possibly due to alcohol abuse -FOBT negative, hemoglobin level 7.6, no obvious bleeding    GERD (gastroesophageal reflux disease) -Continue PPI     Cellulitis, lower extremity edema with wounds -Lower extremity edema likely due to severe hypoalbuminemia and third spacing.  2D echo in 10/2019 had shown EF of 60 to 65%, normal LV function, will repeat. -Wound care consult, started on IV Rocephin -Obtain venous Dopplers lower extremity to rule out DVT  DVT prophylaxis: Heparin subcu  CODE STATUS: Full CODE STATUS   Consults called: None  Family Communication: No family member at the bedside  Admission status:   The medical decision making on this patient was of high complexity and the patient is at high risk for clinical deterioration, therefore this is a level 3 admission.  Severity of Illness:      The appropriate patient status for this patient is INPATIENT. Inpatient status is judged to be reasonable and necessary in order to provide the required intensity of service to ensure  the patient's safety. The patient's presenting symptoms, physical exam findings, and initial radiographic and laboratory data in the context of their chronic comorbidities is felt to place them at high risk for further clinical deterioration. Furthermore, it is not anticipated that the patient will be medically stable for discharge from the hospital within 2 midnights of admission. The following factors support the patient status of inpatient.   " The patient's presenting symptoms include altered mental status, lower extremity cellulitis with wounds, alcohol withdrawal " The worrisome physical exam findings include bilateral lower extremity cellulitis with wounds, edema, delirium " The initial radiographic and laboratory data are worrisome because of anemia, acute kidney injury, leukocytosis " The chronic co-morbidities include HIV disease, heavy alcohol abuse, schizoaffective disorder, noncompliance   * I certify that at the point of admission it is my clinical judgment that the patient will require inpatient hospital care spanning beyond 2 midnights from the point of admission due to high intensity of service, high risk for further deterioration and high frequency of surveillance required.*   Time Spent on Admission: 70 minutes     Teller Wakefield M.D. [facility] 05/07/2021, 5:12 PM

## 2021-05-07 NOTE — ED Notes (Addendum)
RN administered night PO medication. RN attempted first to have patient take a sip of water. Patient began choking and coughing after first sip. Suction set up at bedside. Patient asked for another sip of water and pills crushed in apple sauce. Patient swallowed pills successfully but still had trouble swallowing. RN notifying night coverage physician to switch medications to IV.

## 2021-05-07 NOTE — ED Notes (Signed)
RN spoke to patients mother. Gave an update.

## 2021-05-07 NOTE — ED Notes (Signed)
RN asked Pharm Tech to complete med rec to get the medications verified by pharmacy.

## 2021-05-07 NOTE — ED Triage Notes (Signed)
BIBA Per EMS: Pt coming from home. Brother called out due to patient not taking care of self lately ; Lying on couch covered in feces and urine. Not been eating or taking meds recently. Pt was surrounded by alcohol bottles. Pt brother states pt does have some edema normally but the edema in legs today was way worse than normal. Hx cirrhosis  A&O x4  All vitals WDL  98 HR  132/92  98% RA  118 CBG

## 2021-05-07 NOTE — ED Notes (Signed)
Pt cleaned up. Condom cath placed on pt.

## 2021-05-07 NOTE — ED Provider Notes (Signed)
[facility] DEPT Provider Note   CSN: [id number] Arrival date & time: 05/07/21  1330     History Chief Complaint  Patient presents with   Leg Swelling    [name] is a 35 y.o. male.  He has a history of HIV seizures psychiatric disorder.  Per EMS family called because he has been not taking his meds laying on the couch for the last 2 weeks covered in feces and urine, not eating.  Surrounded by alcohol bottles.  Increased lower extremity edema.  Level 5 caveat secondary to altered mental status.  The history is provided by the patient and the EMS personnel.  Weakness Severity:  Severe Onset quality:  Gradual Duration:  2 weeks Timing:  Constant Progression:  Unchanged Context: alcohol use   Relieved by:  Nothing Worsened by:  Nothing Ineffective treatments:  None tried Associated symptoms: difficulty walking       Past Medical History:  Diagnosis Date   Bipolar 1 disorder ([place])    Depression    Dizziness and giddiness 02/01/2016   Herpes genitalia    HIV disease ([place])    Hypertension    Migraine headache 02/01/2016   Peripheral neuropathy 10/01/2019   PTSD (post-traumatic stress disorder)    Schizoaffective disorder ([place])    Seizures ([place])     Patient Active Problem List   Diagnosis Date Noted   Lymphadenopathy 09/29/2020   Anemia    Upper urinary tract infection    Sepsis secondary to UTI ([place]) 03/14/2020   Cellulitis of groin 03/14/2020   Elevated LFTs 03/14/2020   HIV disease ([place])    Acute encephalopathy    Hyponatremia    Macrocytic anemia    Thrombocytopenia ([facility])    Prolonged QT interval    Acute respiratory failure due to COVID-19 ([place]) 10/27/2019   GERD (gastroesophageal reflux disease) 10/27/2019   Hypokalemia 10/27/2019   Peripheral neuropathy 10/01/2019   PTSD (post-traumatic stress disorder) 07/23/2018   Dizziness and giddiness 02/01/2016   Migraine headache 02/01/2016   Schizoaffective disorder,  depressive type ([place]) 05/13/2015   Severe alcohol dependence ([place]) 05/13/2015   Suicidal ideation 01/12/2014    Past Surgical History:  Procedure Laterality Date   BACK SURGERY     BIOPSY  02/26/2021   Procedure: BIOPSY;  Surgeon: Otis Brace, MD;  Location: [facility];  Service: Gastroenterology;;   COLONOSCOPY WITH PROPOFOL [name] 02/26/2021   Procedure: COLONOSCOPY WITH PROPOFOL;  Surgeon: Otis Brace, MD;  Location: [facility];  Service: Gastroenterology;  Laterality: [name];   ESOPHAGOGASTRODUODENOSCOPY (EGD) WITH PROPOFOL [name] 02/26/2021   Procedure: ESOPHAGOGASTRODUODENOSCOPY (EGD) WITH PROPOFOL;  Surgeon: Otis Brace, MD;  [facility];  Service: Gastroenterology;  Laterality: [name];   HAND SURGERY         Family History  Problem Relation Age of Onset   Alcohol abuse Mother    Schizophrenia Father    Depression Father    Alcohol abuse Father    Alcohol abuse Paternal Uncle    Alcohol abuse Paternal Uncle     Social History   Tobacco Use   Smoking status: Every Day    Packs/day: 1.00    Types: Cigarettes   Smokeless tobacco: Never  Vaping Use   Vaping Use: Never used  Substance Use Topics   Alcohol use: Yes    Alcohol/week: 14.0 standard drinks    Types: 14 Cans of beer per week    Comment: 5-6 40oz per day   Drug use: No  Home Medications Prior to Admission medications   Medication Sig Start Date End Date Taking? Authorizing Provider  albuterol (PROVENTIL HFA;VENTOLIN HFA) 108 (90 BASE) MCG/ACT inhaler Inhale 2 puffs into the lungs every 6 (six) hours as needed for wheezing or shortness of breath.    [provider]  alprazolam Duanne Moron) 2 MG tablet Take 2 mg by mouth 3 (three) times daily.    [provider]  amoxicillin-clavulanate (AUGMENTIN) 875-125 MG tablet Take 1 tablet by mouth every 12 (twelve) hours. 10/23/20   Hazel Sams, PA-C  benzonatate (TESSALON) 100 MG capsule Take 200 mg by mouth every 8 (eight)  hours as needed for cough. 10/23/19   [provider]  bictegravir-emtricitabine-tenofovir AF (BIKTARVY) 50-200-25 MG TABS tablet Take 1 tablet by mouth daily.  02/24/17   [provider]  cetirizine (ZYRTEC) 10 MG tablet Take 1 tablet (10 mg total) by mouth daily. Patient taking differently: Take 10 mg by mouth daily as needed for allergies. 05/15/15   Niel Hummer, NP  Clindamycin-Benzoyl Per, Refr, gel Apply 1 application topically daily. 09/02/20   [provider]  diphenoxylate-atropine (LOMOTIL) 2.5-0.025 MG tablet Take 1 tablet by mouth every 8 (eight) hours as needed for diarrhea or loose stools. 02/16/21   [provider]  escitalopram (LEXAPRO) 20 MG tablet Take 1 tablet by mouth daily. 02/16/21   [provider]  famotidine (PEPCID) 40 MG tablet Take 40 mg by mouth daily.    [provider]  folic acid (FOLVITE) 1 MG tablet Take 1 mg by mouth daily. 10/03/19   [provider]  furosemide (LASIX) 40 MG tablet Take 40 mg by mouth daily as needed for fluid. 02/27/20   [provider]  haloperidol (HALDOL) 5 MG tablet Take 5 mg by mouth daily.  06/19/19   [provider]  hydrOXYzine (ATARAX/VISTARIL) 50 MG tablet Take 1 tablet (50 mg total) by mouth every 6 (six) hours as needed for anxiety. 07/25/18   Pennelope Bracken, MD  lurasidone 120 MG TABS Take 1 tablet (120 mg total) by mouth daily. 05/15/15   Niel Hummer, NP  meclizine (ANTIVERT) 25 MG tablet Take 25 mg by mouth every 8 (eight) hours as needed for dizziness.  03/02/20   [provider]  ondansetron (ZOFRAN) 8 MG tablet Take 8 mg by mouth every 8 (eight) hours as needed for nausea/vomiting, nausea or vomiting. 10/03/19   [provider]  pantoprazole (PROTONIX) 40 MG tablet Take 1 tablet (40 mg total) by mouth 2 (two) times daily. 02/26/21   Otis Brace, MD  thiamine (VITAMIN B-1) 100 MG tablet Take 100 mg by mouth daily.  01/31/19    [provider]  topiramate (TOPAMAX) 100 MG tablet TAKE 1 TABLET (100 MG TOTAL) BY MOUTH AT BEDTIME. 03/18/21   Suzzanne Cloud, NP  traZODone (DESYREL) 300 MG tablet Take 1 tablet (300 mg total) by mouth at bedtime as needed for sleep. Patient taking differently: Take 1,200 mg by mouth at bedtime. 05/15/15   Niel Hummer, NP  valACYclovir (VALTREX) 1000 MG tablet Take 1 tablet (1,000 mg total) by mouth daily. 05/15/15   Niel Hummer, NP  zolpidem (AMBIEN) 10 MG tablet Take 10 mg by mouth at bedtime. 09/06/19   [provider]    Allergies    Dapsone and Primaquine phosphate  Review of Systems   Review of Systems  Unable to perform ROS: Mental status change  Neurological:  Positive for weakness.  Physical Exam Updated Vital Signs BP 113/67   Pulse 87   Temp (!) 97.4 F (36.3 C) (Oral)   Resp 13   SpO2 100%   Physical Exam Vitals and nursing note reviewed.  Constitutional:      General: He is not in acute distress.    Appearance: Normal appearance. He is well-developed and overweight.  HENT:     Head: Normocephalic and atraumatic.  Eyes:     Conjunctiva/sclera: Conjunctivae normal.  Cardiovascular:     Rate and Rhythm: Normal rate and regular rhythm.     Heart sounds: No murmur heard. Pulmonary:     Effort: Pulmonary effort is normal. No respiratory distress.     Breath sounds: Normal breath sounds.  Abdominal:     Palpations: Abdomen is soft.     Tenderness: There is no abdominal tenderness. There is no guarding or rebound.  Musculoskeletal:     Cervical back: Neck supple.     Right lower leg: Edema present.     Left lower leg: Edema present.  Skin:    General: Skin is warm and dry.     Comments: Multiple superficial ulcerations of trunk and perineum.  Lower extremity edema and erythema.  Neurological:     General: No focal deficit present.     Mental Status: He is easily aroused. He is disoriented.     Comments: Patient is arousable to voice.   His speech is not slurred but it is difficult to understand him.  He follows some commands.  Moving his upper extremities without any limitations although seems generally weak in his lower extremities.    ED Results / Procedures / Treatments   Labs (all labs ordered are listed, but only abnormal results are displayed) Labs Reviewed  COMPREHENSIVE METABOLIC PANEL - Abnormal; Notable for the following components:      Result Value   Sodium 134 (*)    Creatinine, Ser 1.32 (*)    Calcium 8.6 (*)    Albumin 1.9 (*)    AST 43 (*)    Alkaline Phosphatase 209 (*)    All other components within normal limits  CBC WITH DIFFERENTIAL/PLATELET - Abnormal; Notable for the following components:   WBC 18.5 (*)    RBC 2.23 (*)    Hemoglobin 7.6 (*)    HCT 23.8 (*)    MCV 106.7 (*)    MCH 34.1 (*)    Platelets 142 (*)    Neutro Abs 15.3 (*)    Monocytes Absolute 1.2 (*)    Abs Immature Granulocytes 0.53 (*)    All other components within normal limits  PROTIME-INR - Abnormal; Notable for the following components:   Prothrombin Time 15.5 (*)    All other components within normal limits  IRON AND TIBC - Abnormal; Notable for the following components:   TIBC 98 (*)    Saturation Ratios 70 (*)    All other components within normal limits  FERRITIN - Abnormal; Notable for the following components:   Ferritin 433 (*)    All other components within normal limits  RESP PANEL BY RT-PCR (FLU A&B, COVID) ARPGX2  URINE CULTURE  CULTURE, BLOOD (ROUTINE X 2)  CULTURE, BLOOD (ROUTINE X 2)  ETHANOL  LIPASE, BLOOD  MAGNESIUM  BRAIN NATRIURETIC PEPTIDE  LACTIC ACID, PLASMA  AMMONIA  PROCALCITONIN  FOLATE  URINALYSIS, ROUTINE W REFLEX MICROSCOPIC  VITAMIN B12  RETICULOCYTES  VITAMIN B1  COMPREHENSIVE METABOLIC PANEL  MAGNESIUM  PHOSPHORUS  CBC  COMPREHENSIVE  METABOLIC PANEL  CBC  POC OCCULT BLOOD, ED  TROPONIN I (HIGH SENSITIVITY)  TROPONIN I (HIGH SENSITIVITY)    EKG EKG  Interpretation  Date/Time:  Friday May 07 2021 15:05:11 EDT Ventricular Rate:  85 PR Interval:  142 QRS Duration: 99 QT Interval:  404 QTC Calculation: 481 R Axis:   40 Text Interpretation: Sinus rhythm Borderline low voltage, extremity leads Abnormal T, consider ischemia, anterior leads No significant change since prior 6/21 Confirmed by Aletta Edouard [id number]) on 05/07/2021 3:08:49 PM  Radiology CT Head Wo Contrast  Result Date: 05/07/2021 CLINICAL DATA:  Mental status change, unknown cause. EXAM: CT HEAD WITHOUT CONTRAST TECHNIQUE: Contiguous axial images were obtained from the base of the skull through the vertex without intravenous contrast. COMPARISON:  03/14/2020 FINDINGS: Brain: No evidence for acute hemorrhage, mass lesion, midline shift, hydrocephalus or large infarct. Again noted are calcifications in the basal ganglia. Vascular: No hyperdense vessel or unexpected calcification. Skull: Normal. Negative for fracture or focal lesion. Sinuses/Orbits: Increased fluid in the right mastoid air cells. Small focus of mucosal thickening near the junction of the right ethmoid air cells and right frontal sinus. Other: Superficial low-density nodules along the lateral aspect of the face and temporal regions. There were similar findings on the prior examination but these superficial structures are now more conspicuous. Index lesion on the left side on sequence 2 image 1 measures 9 mm. IMPRESSION: 1. No acute intracranial abnormality. 2. Increased fluid in the right ethmoid air cells. 3. Again noted are small superficial nodules along the lateral aspect of the face. These small superficial nodular structures may have enlarged in the interim. Recommend clinical correlation in these areas. Electronically Signed   By: Markus Daft M.D.   On: 05/07/2021 17:50   US Abdomen Complete  Result Date: 05/07/2021 CLINICAL DATA:  Transaminitis. Severe occult dependence. HIV. EXAM: ABDOMEN ULTRASOUND COMPLETE  COMPARISON:  03/14/2020 FINDINGS: Gallbladder: No gallstones or wall thickening visualized. No sonographic Murphy sign noted by sonographer. Common bile duct: Diameter: 3 mm, within normal limits. Liver: Diffusely increased echogenicity of the hepatic parenchyma, consistent with hepatic steatosis. No hepatic mass identified. Portal vein is patent on color Doppler imaging with normal direction of blood flow towards the liver. IVC: No abnormality visualized. Pancreas: Visualized portion unremarkable. Spleen: Size and appearance within normal limits. Right Kidney: Length: 10.6 cm. Echogenicity within normal limits. No mass or hydronephrosis visualized. Left Kidney: Length: 11.3 cm. Echogenicity within normal limits. No mass or hydronephrosis visualized. Abdominal aorta: No aneurysm visualized. Other findings: None. IMPRESSION: Diffuse hepatic steatosis, without significant change since prior exam. No hepatic mass or other significant abnormality identified. No evidence of gallstones or biliary ductal dilatation. Electronically Signed   By: Marlaine Hind M.D.   On: 05/07/2021 18:41   DG Chest Port 1 View  Result Date: 05/07/2021 CLINICAL DATA:  Leg swelling EXAM: PORTABLE CHEST 1 VIEW COMPARISON:  Chest x-ray dated March 15, 2020 FINDINGS: Cardiac and mediastinal contours are unchanged. Clear lungs. No pleural effusion or pneumothorax. IMPRESSION: Clear lungs. Electronically Signed   By: Yetta Glassman MD   On: 05/07/2021 16:14    Procedures .Critical Care  Date/Time: 05/07/2021 7:16 PM Performed by: Hayden Rasmussen, MD Authorized by: Hayden Rasmussen, MD   Critical care provider statement:    Critical care time (minutes):  45   Critical care time was exclusive of:  Separately billable procedures and treating other patients   Critical care was necessary to treat or prevent imminent or  life-threatening deterioration of the following conditions:  CNS failure or compromise   Critical care was time spent  personally by me on the following activities:  Discussions with consultants, evaluation of patient's response to treatment, examination of patient, ordering and performing treatments and interventions, ordering and review of laboratory studies, ordering and review of radiographic studies, pulse oximetry, re-evaluation of patient's condition, obtaining history from patient or surrogate, review of old charts and development of treatment plan with patient or surrogate   Medications Ordered in ED Medications  lactated ringers infusion ( Intravenous New Bag/Given 05/07/21 1724)  pantoprazole (PROTONIX) EC tablet 40 mg (has no administration in time range)  albuterol (VENTOLIN HFA) 108 (90 Base) MCG/ACT inhaler 2 puff (has no administration in time range)  heparin injection 5,000 Units (has no administration in time range)  cefTRIAXone (ROCEPHIN) 2 g in sodium chloride 0.9 % 100 mL IVPB (has no administration in time range)  acetaminophen (TYLENOL) tablet 650 mg (has no administration in time range)    Or  acetaminophen (TYLENOL) suppository 650 mg (has no administration in time range)  LORazepam (ATIVAN) tablet 1-4 mg (has no administration in time range)    Or  LORazepam (ATIVAN) injection 1-4 mg (has no administration in time range)  folic acid (FOLVITE) tablet 1 mg (has no administration in time range)  multivitamin with minerals tablet 1 tablet (has no administration in time range)  thiamine 560m in normal saline (530m IVPB (has no administration in time range)  bictegravir-emtricitabine-tenofovir AF (BIKTARVY) 50-200-25 MG per tablet 1 tablet (has no administration in time range)  valACYclovir (VALTREX) tablet 1,000 mg (has no administration in time range)    ED Course  I have reviewed the triage vital signs and the nursing notes.  Pertinent labs & imaging results that were available during my care of the patient were reviewed by me and considered in my medical decision making (see chart  for details).  Clinical Course as of 05/07/21 1913  Fri May 07, 2021  1544 Chest x-ray interpreted by me as no acute infiltrates. [MB]  [id number] exam done normal tone no gross blood.  Sent to lab for guaiac. [MB]    Clinical Course User Index [MB] BuHayden RasmussenMD   MDM Rules/Calculators/A&P                          AnWarrenas evaluated in Emergency Department on 05/07/2021 for the symptoms described in the history of present illness. He was evaluated in the context of the global COVID-19 pandemic, which necessitated consideration that the patient might be at risk for infection with the SARS-CoV-2 virus that causes COVID-19. Institutional protocols and algorithms that pertain to the evaluation of patients at risk for COVID-19 are in a state of rapid change based on information released by regulatory bodies including the CDC and federal and state organizations. These policies and algorithms were followed during the patient's care in the ED.  This patient complains of confusion weakness peripheral edema failure to thrive; this involves an extensive number of treatment Options and is a complaint that carries with it a high risk of complications and Morbidity. The differential includes stroke, bleed, encephalopathy, metabolic derangement, alcohol intoxication, alcohol withdrawal, hepatic failure  I ordered, reviewed and interpreted labs, which included elevated white count hemoglobin lower than priors, platelets mildly low, chemistries with mild elevation of creatinine, low albumin like reflecting his alcohol abuse and poor nutrition, alcohol level negative,  lactic acid not elevated, troponin not elevated  I ordered imaging studies which included chest x-ray and head CT and I independently    visualized and interpreted imaging which showed no acute findings Additional history obtained from EMS Previous records obtained and reviewed in epic, has seen both GI and infectious disease  within the last few months.  It sounds like he was fairly functional at that time. I consulted [facility] hospitalist Dr. Tana Coast and discussed lab and imaging findings  Critical Interventions: Evaluation and work-up of patient's altered mental status  After the interventions stated above, I reevaluated the patient and found patient to be reasonably hemodynamically stable although clearly not appropriate for discharge.  He will need to be admitted to the hospital for further work-up.   Final Clinical Impression(s) / ED Diagnoses Final diagnoses:  Acute metabolic encephalopathy  Peripheral edema    Rx / DC Orders ED Discharge Orders     None        Hayden Rasmussen, MD 05/07/21 1919

## 2021-05-08 ENCOUNTER — Inpatient Hospital Stay (HOSPITAL_COMMUNITY): Payer: Medicare HMO

## 2021-05-08 DIAGNOSIS — R7989 Other specified abnormal findings of blood chemistry: Secondary | ICD-10-CM | POA: Diagnosis not present

## 2021-05-08 DIAGNOSIS — G9341 Metabolic encephalopathy: Secondary | ICD-10-CM | POA: Diagnosis not present

## 2021-05-08 DIAGNOSIS — R609 Edema, unspecified: Secondary | ICD-10-CM

## 2021-05-08 DIAGNOSIS — B20 Human immunodeficiency virus [HIV] disease: Secondary | ICD-10-CM | POA: Diagnosis not present

## 2021-05-08 DIAGNOSIS — L03119 Cellulitis of unspecified part of limb: Secondary | ICD-10-CM | POA: Diagnosis not present

## 2021-05-08 DIAGNOSIS — I5030 Unspecified diastolic (congestive) heart failure: Secondary | ICD-10-CM | POA: Diagnosis not present

## 2021-05-08 LAB — COMPREHENSIVE METABOLIC PANEL
ALT: 29 U/L (ref 0–44)
ALT: 30 U/L (ref 0–44)
AST: 52 U/L — ABNORMAL HIGH (ref 15–41)
AST: 57 U/L — ABNORMAL HIGH (ref 15–41)
Albumin: 1.8 g/dL — ABNORMAL LOW (ref 3.5–5.0)
Albumin: 1.7 g/dL — ABNORMAL LOW (ref 3.5–5.0)
Alkaline Phosphatase: 198 U/L — ABNORMAL HIGH (ref 38–126)
Alkaline Phosphatase: 195 U/L — ABNORMAL HIGH (ref 38–126)
Anion gap: 8 (ref 5–15)
Anion gap: 8 (ref 5–15)
BUN: 12 mg/dL (ref 6–20)
BUN: 12 mg/dL (ref 6–20)
CO2: 22 mmol/L (ref 22–32)
CO2: 23 mmol/L (ref 22–32)
Calcium: 8.4 mg/dL — ABNORMAL LOW (ref 8.9–10.3)
Calcium: 8.4 mg/dL — ABNORMAL LOW (ref 8.9–10.3)
Chloride: 103 mmol/L (ref 98–111)
Chloride: 105 mmol/L (ref 98–111)
Creatinine, Ser: 1.32 mg/dL — ABNORMAL HIGH (ref 0.61–1.24)
Creatinine, Ser: 1.35 mg/dL — ABNORMAL HIGH (ref 0.61–1.24)
GFR, Estimated: >60 mL/min (ref >60)
GFR, Estimated: >60 mL/min (ref >60)
Glucose, Bld: 102 mg/dL — ABNORMAL HIGH (ref 70–99)
Glucose, Bld: 97 mg/dL (ref 70–99)
Potassium: 3.8 mmol/L (ref 3.5–5.1)
Potassium: 3.8 mmol/L (ref 3.5–5.1)
Sodium: 133 mmol/L — ABNORMAL LOW (ref 135–145)
Sodium: 136 mmol/L (ref 135–145)
Total Bilirubin: 1.1 mg/dL (ref 0.3–1.2)
Total Bilirubin: 1 mg/dL (ref 0.3–1.2)
Total Protein: 7.2 g/dL (ref 6.5–8.1)
Total Protein: 6.9 g/dL (ref 6.5–8.1)

## 2021-05-08 LAB — CBC
HCT: 25.9 % — ABNORMAL LOW (ref 39.0–52.0)
Hemoglobin: 8.1 g/dL — ABNORMAL LOW (ref 13.0–17.0)
MCH: 34 pg (ref 26.0–34.0)
MCHC: 31.3 g/dL (ref 30.0–36.0)
MCV: 108.8 fL — ABNORMAL HIGH (ref 80.0–100.0)
Platelets: 116 10*3/uL — ABNORMAL LOW (ref 150–400)
RBC: 2.38 MIL/uL — ABNORMAL LOW (ref 4.22–5.81)
RDW: 15 % (ref 11.5–15.5)
WBC: 19.1 10*3/uL — ABNORMAL HIGH (ref 4.0–10.5)
nRBC: 0 % (ref 0.0–0.2)

## 2021-05-08 LAB — VITAMIN B1

## 2021-05-08 LAB — TROPONIN I (HIGH SENSITIVITY): Troponin I (High Sensitivity): 3 ng/L (ref <18)

## 2021-05-08 LAB — ECHOCARDIOGRAM COMPLETE
Area-P 1/2: 3.99 cm2
S' Lateral: 2.3 cm

## 2021-05-08 LAB — TSH: TSH: 1.197 u[IU]/mL (ref 0.350–4.500)

## 2021-05-08 LAB — MAGNESIUM: Magnesium: 1.7 mg/dL (ref 1.7–2.4)

## 2021-05-08 LAB — PHOSPHORUS: Phosphorus: 3.3 mg/dL (ref 2.5–4.6)

## 2021-05-08 MED ORDER — BENZONATATE 100 MG PO CAPS
200.0000 mg | ORAL_CAPSULE | Freq: Three times a day (TID) | ORAL | Status: DC
  Administered 2021-05-08: 200 mg via ORAL
  Filled 2021-05-08: qty 2

## 2021-05-08 MED ORDER — TRAZODONE HCL 100 MG PO TABS
200.0000 mg | ORAL_TABLET | Freq: Every evening | ORAL | Status: DC | PRN
  Administered 2021-05-08 – 2021-05-17 (×7): 200 mg via ORAL
  Filled 2021-05-08 (×7): qty 2

## 2021-05-08 MED ORDER — HALOPERIDOL 5 MG PO TABS: 5.0000 mg | ORAL_TABLET | Freq: Every day | ORAL | Status: DC

## 2021-05-08 MED ORDER — HALOPERIDOL LACTATE 5 MG/ML IJ SOLN: 10.0000 mg | Freq: Two times a day (BID) | INTRAMUSCULAR | Status: DC

## 2021-05-08 MED ORDER — ALBUTEROL SULFATE (2.5 MG/3ML) 0.083% IN NEBU
2.5000 mg | INHALATION_SOLUTION | Freq: Four times a day (QID) | RESPIRATORY_TRACT | Status: DC | PRN
  Administered 2021-05-10: 2.5 mg via RESPIRATORY_TRACT
  Filled 2021-05-08: qty 3

## 2021-05-08 MED ORDER — HALOPERIDOL LACTATE 5 MG/ML IJ SOLN: 5.0000 mg | Freq: Two times a day (BID) | INTRAMUSCULAR | Status: DC | PRN

## 2021-05-08 MED ORDER — HALOPERIDOL 5 MG PO TABS
5.0000 mg | ORAL_TABLET | Freq: Two times a day (BID) | ORAL | Status: DC | PRN
  Filled 2021-05-08: qty 1

## 2021-05-08 MED ORDER — PANTOPRAZOLE SODIUM 40 MG PO TBEC
40.0000 mg | DELAYED_RELEASE_TABLET | Freq: Two times a day (BID) | ORAL | Status: DC
  Administered 2021-05-09 – 2021-05-17 (×16): 40 mg via ORAL
  Filled 2021-05-08 (×17): qty 1

## 2021-05-08 MED ORDER — HYDROXYZINE HCL 25 MG PO TABS: 50.0000 mg | ORAL_TABLET | Freq: Four times a day (QID) | ORAL | Status: DC | PRN

## 2021-05-08 MED ORDER — PANTOPRAZOLE SODIUM 40 MG PO TBEC: 40.0000 mg | DELAYED_RELEASE_TABLET | Freq: Two times a day (BID) | ORAL | Status: DC

## 2021-05-08 MED ORDER — GUAIFENESIN ER 600 MG PO TB12
1200.0000 mg | ORAL_TABLET | Freq: Two times a day (BID) | ORAL | Status: DC
  Administered 2021-05-08 – 2021-05-19 (×21): 1200 mg via ORAL
  Filled 2021-05-08 (×21): qty 2

## 2021-05-08 MED ORDER — FAMOTIDINE 20 MG PO TABS
40.0000 mg | ORAL_TABLET | Freq: Every day | ORAL | Status: DC
  Administered 2021-05-09 – 2021-05-17 (×9): 40 mg via ORAL
  Filled 2021-05-08 (×10): qty 2

## 2021-05-08 MED ORDER — HALOPERIDOL 5 MG PO TABS: 5.0000 mg | ORAL_TABLET | Freq: Two times a day (BID) | ORAL | Status: DC

## 2021-05-08 MED ORDER — VANCOMYCIN HCL 2000 MG/400ML IV SOLN
2000.0000 mg | Freq: Once | INTRAVENOUS | Status: AC
  Administered 2021-05-08: 2000 mg via INTRAVENOUS
  Filled 2021-05-08: qty 400

## 2021-05-08 MED ORDER — VANCOMYCIN HCL 1250 MG/250ML IV SOLN
1250.0000 mg | Freq: Two times a day (BID) | INTRAVENOUS | Status: DC
  Administered 2021-05-08 – 2021-05-09 (×3): 1250 mg via INTRAVENOUS
  Filled 2021-05-08 (×4): qty 250

## 2021-05-08 MED ORDER — TOPIRAMATE 100 MG PO TABS
100.0000 mg | ORAL_TABLET | Freq: Every day | ORAL | Status: DC
Start: 2021-05-08 — End: 2021-05-08

## 2021-05-08 MED ORDER — ALPRAZOLAM 0.5 MG PO TABS: 2.0000 mg | ORAL_TABLET | Freq: Three times a day (TID) | ORAL | Status: DC

## 2021-05-08 NOTE — Progress Notes (Signed)
Bilateral lower extremity venous duplex completed. Refer to "CV Proc" under chart review to view preliminary results.  05/08/2021 2:59 PM Kelby Aline., MHA, RVT, RDCS, RDMS

## 2021-05-08 NOTE — ED Notes (Signed)
Patient transported to X-ray 

## 2021-05-08 NOTE — Evaluation (Signed)
Clinical/Bedside Swallow Evaluation Patient Details  Name: [name] MRN: [id number] Date of Birth: 02-01-1986  Today's Date: 05/08/2021 Time: SLP Start Time (ACUTE ONLY): 0950 SLP Stop Time (ACUTE ONLY): 1010 SLP Time Calculation (min) (ACUTE ONLY): 20 min  Past Medical History:  Past Medical History:  Diagnosis Date   Bipolar 1 disorder ([place])    Depression    Dizziness and giddiness 02/01/2016   Herpes genitalia    HIV disease ([place])    Hypertension    Migraine headache 02/01/2016   Peripheral neuropathy 10/01/2019   PTSD (post-traumatic stress disorder)    Schizoaffective disorder ([place])    Seizures ([place])    Past Surgical History:  Past Surgical History:  Procedure Laterality Date   BACK SURGERY     BIOPSY  02/26/2021   Procedure: BIOPSY;  Surgeon: Otis Brace, MD;  [facility];  Service: Gastroenterology;;   COLONOSCOPY WITH PROPOFOL [name] 02/26/2021   Procedure: COLONOSCOPY WITH PROPOFOL;  Surgeon: Otis Brace, MD;  [facility];  Service: Gastroenterology;  Laterality: [name];   ESOPHAGOGASTRODUODENOSCOPY (EGD) WITH PROPOFOL [name] 02/26/2021   Procedure: ESOPHAGOGASTRODUODENOSCOPY (EGD) WITH PROPOFOL;  Surgeon: Otis Brace, MD;  [facility];  Service: Gastroenterology;  Laterality: [name];   HAND SURGERY     HPI:  Patient is a 35 y.o. male with PMH: PTSD, HIV, schizoaffective disorder, seizures, heavy alcohol abuse, HTN. He was brought to ED via EMS from home for AMS. Patient's family called EMS because patient was not taking his medications and has been laying on the couch for past 2 weeks covered in feces and urine, surrounded by multiple alcohol bottles. In addition, he was noted to have increased LE swelling and sores on feet.  Patient was evaluated by SLP for swallow function in January of 2021 and MBS was completed which showed aspiration of thin liquids with larger sips, but without penetration or aspiration when taking smaller  more controlled sips.   Assessment / Plan / Recommendation Clinical Impression  Patient presents with a mod-severe suspected pharyngeal phase dysphagia with immediate and prolonged coughing response after over 50% of sips of thin liquids. Patient with h/o dysphagia and MBS in 2021 revealed aspiration when patient taking larger sips thin liquids, but no aspiration or penetration with smaller sips. SLP is recommending MBS to determine if significant change in swallow function. SLP Visit Diagnosis: Dysphagia, unspecified (R13.10)    Aspiration Risk  Moderate aspiration risk    Diet Recommendation Thin liquid   Liquid Administration via: Cup;Straw Medication Administration: Whole meds with puree Supervision: Patient able to self feed Compensations: Slow rate;Small sips/bites;Minimize environmental distractions Postural Changes: Seated upright at 90 degrees    Other  Recommendations Oral Care Recommendations: Oral care BID   Follow up Recommendations Other (comment) (TBD, likely none)      Frequency and Duration min 1 x/week  1 week       Prognosis Prognosis for Safe Diet Advancement: Good Barriers to Reach Goals: Cognitive deficits      Swallow Study   General Date of Onset: 05/07/21 HPI: Patient is a 35 y.o. male with PMH: PTSD, HIV, schizoaffective disorder, seizures, heavy alcohol abuse, HTN. He was brought to ED via EMS from home for AMS. Patient's family called EMS because patient was not taking his medications and has been laying on the couch for past 2 weeks covered in feces and urine, surrounded by multiple alcohol bottles. In addition, he was noted to have increased LE swelling and sores on feet.  Patient was evaluated by SLP for swallow function in January of 2021 and MBS was completed which showed aspiration of thin liquids with larger sips, but without penetration or aspiration when taking smaller more controlled sips. Type of Study: Bedside Swallow Evaluation Previous  Swallow Assessment: MBS during previous admission 10/2019 Diet Prior to this Study: Thin liquids;Other (Comment) (clear liquids) Temperature Spikes Noted: No Respiratory Status: Room air History of Recent Intubation: No Behavior/Cognition: Alert;Cooperative;Pleasant mood;Confused Oral Cavity Assessment: Within Functional Limits Oral Care Completed by SLP: No Oral Cavity - Dentition: Adequate natural dentition Vision: Functional for self-feeding Self-Feeding Abilities: Able to feed self Patient Positioning: Upright in bed Baseline Vocal Quality: Normal Volitional Cough: Strong Volitional Swallow: Able to elicit    Oral/Motor/Sensory Function Overall Oral Motor/Sensory Function: Within functional limits   Ice Chips     Thin Liquid Thin Liquid: Impaired Pharyngeal  Phase Impairments: Suspected delayed Swallow;Cough - Immediate Other Comments: excessive coughing after first few sips of thin liquids, however patient then did drink several consecutive sips without any overt s/s aspiration or penetration    Nectar Thick     Honey Thick     Puree Puree: Not tested   Solid     Solid: Not tested      Sonia Baller, MA, CCC-SLP Speech Therapy

## 2021-05-08 NOTE — Progress Notes (Signed)
Patients mother, Luellen Pucker, updated via phone.

## 2021-05-08 NOTE — Progress Notes (Signed)
Echocardiogram 2D Echocardiogram has been performed.  Oneal Deputy Elbridge Magowan RDCS 05/08/2021, 2:20 PM

## 2021-05-08 NOTE — Progress Notes (Signed)
Modified Barium Swallow Progress Note  Patient Details  Name: [name] MRN: [id number] Date of Birth: [birth year]  Today's Date: 05/08/2021  Modified Barium Swallow completed.  Full report located under Chart Review in the Imaging Section.  Brief recommendations include the following:  Clinical Impression  Patient presents with a mild oral and a moderate pharyngeal phase dysphagia which appears to be largely sensory based. When patient consumed small to regular sized sips of thin liquids via cup, delay in swallow initiation to pyriform sinus was observed, but no aspiration or penetration and full clearance of bolus. When patient took large cup or straw sips and when patient consuming thin liquid barium with barium tablet, liquids pooled into vallecular and pyriform sinuses before swallow was initiated, leading to aspiration before swallow initiation. Patient did immediately cough when aspiration occuring, but this was ineffective to clear aspirate. One instance of flash penetration (PAS 2) observed with thin liquids. No appreciable difference between nectar thick liquids and thin liquids.  He exhibited mildly prolonged mastication of graham cracker, however did have full clearance of oral cavity. SLP compared today's MBS films with previous MBS in January of 2021 and no significant difference was noted in patient's swallow function. SLP is recommending Dys 3 solids, thin liquids, no straws and meds whole in puree.   Swallow Evaluation Recommendations       SLP Diet Recommendations: Dysphagia 3 (Mech soft) solids;Thin liquid   Liquid Administration via: Cup;No straw   Medication Administration: Whole meds with puree   Supervision: Patient able to self feed;Intermittent supervision to cue for compensatory strategies   Compensations: Slow rate;Small sips/bites;Minimize environmental distractions   Postural Changes: Seated upright at 90 degrees   Oral Care Recommendations: Oral care  BID       Sonia Baller, MA, CCC-SLP Speech Therapy

## 2021-05-08 NOTE — Consult Note (Addendum)
Psychiatry consult placed for schizoaffective disorder, depression, acute encephalopathy, has underlying heavy alcohol use.  Please evaluate and adjust medications.   Patient continues to remain difficult to assess, due to his altered mental status and acute metabolic encephalopathy.  EMS was reportedly called, due to patient's noncompliance with his psychotropic medication.  Difficult to obtain history at this time, will benefit from resumption of his psychotropic medications to further target his psychosis and altered mental status.  Chart review shows patient has been followed by Dr. Ihor Gully for avascular necrosis of the hip, with surgery scheduled for August 9.  Patient appears to be compliant with his outpatient medical providers to include infectious disease, oncology, neurology, gastroenterology however there does not appear to be any psychiatric documentation for the past 12 months.  Patient was also admitted last year for sepsis, after review of his labs, patient does appear to be at increased risk for systemic infection and has been admitted.  Patient's labs reviewed include urinary tract infection(large leukocytes, positive for nitrites), elevated white blood cell count of 18.3>>19.1, procalcitonin 4.9, vitamin B12 2821, elevated alkaline phosphatase of 195, an AST of 57.   -Patient's current presentation very consistent with a metabolic encephalopathy related to infection and AKI.  Cognitive improvement will likely lag behind improvement in lab work.  Due to presence of the metabolic encephalopathy this is not an optimal time to evaluate patient for schizoaffective disorder, and medication non-compliance.   -Will order  Haldol 5 mg p.o. twice daily prn.   Patient appears to have difficulty swallowing per chart review, may administer Haldol 5 mg IV if needed.  Will place order for both. -We will also order an EKG.  -Have ordered TSH, B12Vitamin B12 is greatly elevated (2821),  which can also  contribute to his worsening neuropsychiatric presentation (depression and delirium).   -Will attempt to reassess patient once mentation has improved. -Psychiatry to continue to follow, as patient is currently medically ill at this time and remains difficult to assess.

## 2021-05-08 NOTE — Progress Notes (Signed)
Pt remains lethargic since admission to unit. Pt will arouse to voice and will answer self and place orientation questions appropriately but remains disoriented to time and situation. He quickly drifts back to sleep after answering. Pupils are round and reactive to light. Pt follows commands and has strong bilateral grips. Vital signs stable. Holding PO meds at this time d/t lethargy per MD. Will continue to monitor.

## 2021-05-08 NOTE — Progress Notes (Signed)
[facility]                                                                              Patient Demographics  [name], is a 35 y.o. male, DOB - 1985-11-17, HYQ:[id number]  Admit date - 05/07/2021   Admitting Physician Reggie Bise [name], MD  Outpatient Primary MD for the patient is [name], MD  Outpatient specialists:   LOS - 1  days   Medical records reviewed and are as summarized below:    Chief Complaint  Patient presents with   Leg Swelling       Brief summary   Patient is a 35 year old male with history of HIV, PTSD, schizoaffective disorder, seizures, heavy alcohol abuse, hypertension was brought to ED via EMS for altered mental status.  I was unable to obtain any history from the patient due to his mental status (whispering and rambling).  He was able to tell me his name.  Per EMS and EDP records, patient lives alone.  Patient's family called EMS because he was not taking his medications.  He was laying on the couch for the last 2 weeks, covered in feces and urine.  Patient was surrounded by many alcohol bottles.  He was also noted to have increased lower extremity swelling, sores and redness to the feet.   Assessment & Plan   Principal Problem:   Acute metabolic encephalopathy: Unclear etiology, possibly Warnicke's encephalopathy/Korsakoff syndrome or hepatic encephalopathy or seizure or due to acute infection/cellulitis, uremia -Much more alert and oriented today, appears close to his baseline.   -CT head showed no acute intracranial abnormality, increase fluid in the right ethmoid air cells. -Ammonia level 30, follow TSH, B1 -Continue high-dose thiamine 500 mg TID x 3 days -Continue IV Rocephin, added vancomycin, procalcitonin 4.9, follow blood cultures.  Leukocytosis trending up 19.1, no fevers. -Follow UA and culture, procalcitonin, blood cultures.  Given lower extremity sores and cellulitis, placed on IV Rocephin. -Sounds congested,  coughing with productive phlegm, continue IV vancomycin and Rocephin.  Chest x-ray on admission did not show pneumonia however could have aspirated.   -SLP evaluation.   Active Problems:     Cellulitis, lower extremity edema with wounds -Lower extremity edema likely due to severe hypoalbuminemia and third spacing.  2D echo in 10/2019 had shown EF of 60 to 65%, normal LV function -Follow 2D echo -Continue IV Rocephin, wound care consult -Obtain venous Dopplers lower extremity to rule out DVT   Transaminitis with elevated alk phos, AST, thrombocytopenia: Likely due to alcohol abuse -Ammonia level 30 -abdominal ultrasound did not show any cholelithiasis or biliary duct dilatation, showed hepatic steatosis. -Follow LFTs   Acute kidney injury -AKI possibly due to dehydration, poor oral intake, intravascular depletion -Creatinine 1.3 at the time of admission, baseline 1.1 on 12/03/2020 -UA showed ketones, continue IV fluid hydration   HIV disease -Patient has been noncompliant with his antiretrovirals, check viral load -Once tolerating diet, resume Biktarvy, valacyclovir     Schizoaffective disorder, depressive type ([place]) -Appreciate psychiatry recommendations     Severe alcohol dependence ([place]) with likely acute alcohol withdrawal, possible Warnicke's encephalopathy -Alcohol level <10,  although was surrounded by several alcohol bottles, somewhat tremulous -Continue CIWA with Ativan protocol -Continue high-dose thiamine for 3 days, continue MVI, folic acid    Macrocytic anemia, thrombocytopenia -Possibly due to alcohol use -FOBT negative, hemoglobin 7.2 at the time of admission -Anemia panel showed likely anemia of chronic disease -H&H currently stable     GERD (gastroesophageal reflux disease) -Continue PPI   Left hip pain -Reports chronic left hip pain and had several falls at home.  Patient has known history of avascular necrosis of left hip joint -Currently scheduled for  surgery on 05/18/2021 outpatient (Dr. Alvan Dame) for total hip arthroplasty.    Obesity Estimated body mass index is 31.25 kg/m as calculated from the following:   Height as of 02/26/21: 6' 3" (1.905 m).   Weight as of 02/26/21: 113.4 kg.  Code Status: Full CODE STATUS DVT Prophylaxis:  heparin injection 5,000 Units Start: 05/07/21 2200   Level of Care: Level of care: Progressive Family Communication: Discussed all imaging results, lab results, explained to the patient    Disposition Plan:     Status is: Inpatient  Remains inpatient appropriate because:Inpatient level of care appropriate due to severity of illness  Dispo: The patient is from: Home              Anticipated d/c is to:  TBD              Patient currently is not medically stable to d/c.   Difficult to place patient No      Time Spent in minutes   35 mins   Procedures:  CT head, abdominal ultrasound  Consultants:   None    Antimicrobials:   Anti-infectives (From admission, onward)    Start     Dose/Rate Route Frequency Ordered Stop   05/07/21 1915  cefTRIAXone (ROCEPHIN) 2 g in sodium chloride 0.9 % 100 mL IVPB        2 g 200 mL/hr over 30 Minutes Intravenous Every 24 hours 05/07/21 1912 05/14/21 1914   05/07/21 1915  bictegravir-emtricitabine-tenofovir AF (BIKTARVY) 50-200-25 MG per tablet 1 tablet        1 tablet Oral Daily 05/07/21 1912     05/07/21 1915  valACYclovir (VALTREX) tablet 1,000 mg        1,000 mg Oral Daily 05/07/21 1912            Medications  Scheduled Meds:  benzonatate  200 mg Oral TID   bictegravir-emtricitabine-tenofovir AF  1 tablet Oral Daily   famotidine  40 mg Oral Daily   folic acid  1 mg Oral Daily   guaiFENesin  1,200 mg Oral BID   heparin  5,000 Units Subcutaneous Q8H   multivitamin with minerals  1 tablet Oral Daily   pantoprazole  40 mg Oral BID   valACYclovir  1,000 mg Oral Daily   Continuous Infusions:  cefTRIAXone (ROCEPHIN)  IV Stopped (05/07/21 2217)    lactated ringers 100 mL/hr at 05/08/21 0351   thiamine injection Stopped (05/08/21 0619)   PRN Meds:.acetaminophen **OR** acetaminophen, albuterol, haloperidol **OR** haloperidol lactate, LORazepam **OR** LORazepam, trazodone      Subjective:   [name] was seen and examined today.  Alert and oriented today, appears close to his baseline.  Coughing during the encounter with productive phlegm.  States has chronic left hip pain and is due for his surgery on 8/9 (verified in the chart).  No fevers or chills.,  No acute events overnight.  Denies any nausea vomiting, abdominal  pain or any diarrhea.    Objective:   Vitals:   05/08/21 0600 05/08/21 0630 05/08/21 0700 05/08/21 0730  BP: 113/79 115/76 115/61 116/78  Pulse: 100 95 98 95  Resp: _0 Temp:      TempSrc:      SpO2: 99% 100% 99% 100%    Intake/Output Summary (Last 24 hours) at 05/08/2021 1025 Last data filed at 05/08/2021 7124 Gross per 24 hour  Intake 200 ml  Output --  Net 200 ml     Wt Readings from Last 3 Encounters:  02/26/21 113.4 kg  12/03/20 112.9 kg  09/29/20 115 kg     Exam General: Alert and oriented x 3, NAD, coughing Cardiovascular: S1 S2 auscultated, RRR Respiratory: Bilateral rhonchi Gastrointestinal: Soft, nontender, nondistended, + bowel sounds Ext: 2-3+ pitting edema, erythema, tremulous Neuro: no focal weakness Musculoskeletal: No digital cyanosis, clubbing Skin: Sores on the legs Psych: much more alert and oriented today   Data Reviewed:  I have personally reviewed following labs and imaging studies  Micro Results Recent Results (from the past 240 hour(s))  Resp Panel by RT-PCR (Flu A&B, Covid) Nasopharyngeal Swab     Status: None   Collection Time: 05/07/21  2:48 PM   Specimen: Nasopharyngeal Swab; Nasopharyngeal(NP) swabs in vial transport medium  Result Value Ref Range Status   SARS Coronavirus 2 by RT PCR NEGATIVE NEGATIVE Final    Comment: (NOTE) SARS-CoV-2 target  nucleic acids are NOT DETECTED.  The SARS-CoV-2 RNA is generally detectable in upper respiratory specimens during the acute phase of infection. The lowest concentration of SARS-CoV-2 viral copies this assay can detect is 138 copies/mL. A negative result does not preclude SARS-Cov-2 infection and should not be used as the sole basis for treatment or other patient management decisions. A negative result may occur with  improper specimen collection/handling, submission of specimen other than nasopharyngeal swab, presence of viral mutation(s) within the areas targeted by this assay, and inadequate number of viral copies(<138 copies/mL). A negative result must be combined with clinical observations, patient history, and epidemiological information. The expected result is Negative.  Fact Sheet for Patients:  EntrepreneurPulse.com.au  Fact Sheet for Healthcare Providers:  IncredibleEmployment.be  This test is no t yet approved or cleared by the [place] FDA and  has been authorized for detection and/or diagnosis of SARS-CoV-2 by FDA under an Emergency Use Authorization (EUA). This EUA will remain  in effect (meaning this test can be used) for the duration of the COVID-19 declaration under Section 564(b)(1) of the Act, 21 U.S.C.section 360bbb-3(b)(1), unless the authorization is terminated  or revoked sooner.       Influenza A by PCR NEGATIVE NEGATIVE Final   Influenza B by PCR NEGATIVE NEGATIVE Final    Comment: (NOTE) The Xpert Xpress SARS-CoV-2/FLU/RSV plus assay is intended as an aid in the diagnosis of influenza from Nasopharyngeal swab specimens and should not be used as a sole basis for treatment. Nasal washings and aspirates are unacceptable for Xpert Xpress SARS-CoV-2/FLU/RSV testing.  Fact Sheet for Patients: EntrepreneurPulse.com.au  Fact Sheet for Healthcare  Providers: IncredibleEmployment.be  This test is not yet approved or cleared by the [place] FDA and has been authorized for detection and/or diagnosis of SARS-CoV-2 by FDA under an Emergency Use Authorization (EUA). This EUA will remain in effect (meaning this test can be used) for the duration of the COVID-19 declaration under Section 564(b)(1) of the Act, 21 U.S.C. section 360bbb-3(b)(1), unless the authorization is terminated  or revoked.  Performed at [facility], [place] [address], [place], [place] [address]     Radiology Reports CT Head Wo Contrast  Result Date: 05/07/2021 CLINICAL DATA:  Mental status change, unknown cause. EXAM: CT HEAD WITHOUT CONTRAST TECHNIQUE: Contiguous axial images were obtained from the base of the skull through the vertex without intravenous contrast. COMPARISON:  03/14/2020 FINDINGS: Brain: No evidence for acute hemorrhage, mass lesion, midline shift, hydrocephalus or large infarct. Again noted are calcifications in the basal ganglia. Vascular: No hyperdense vessel or unexpected calcification. Skull: Normal. Negative for fracture or focal lesion. Sinuses/Orbits: Increased fluid in the right mastoid air cells. Small focus of mucosal thickening near the junction of the right ethmoid air cells and right frontal sinus. Other: Superficial low-density nodules along the lateral aspect of the face and temporal regions. There were similar findings on the prior examination but these superficial structures are now more conspicuous. Index lesion on the left side on sequence 2 image 1 measures 9 mm. IMPRESSION: 1. No acute intracranial abnormality. 2. Increased fluid in the right ethmoid air cells. 3. Again noted are small superficial nodules along the lateral aspect of the face. These small superficial nodular structures may have enlarged in the interim. Recommend clinical correlation in these areas. Electronically Signed   By: Markus Daft M.D.   On: 05/07/2021 17:50   US Abdomen Complete  Result Date: 05/07/2021 CLINICAL DATA:  Transaminitis. Severe occult dependence. HIV. EXAM: ABDOMEN ULTRASOUND COMPLETE COMPARISON:  03/14/2020 FINDINGS: Gallbladder: No gallstones or wall thickening visualized. No sonographic Murphy sign noted by sonographer. Common bile duct: Diameter: 3 mm, within normal limits. Liver: Diffusely increased echogenicity of the hepatic parenchyma, consistent with hepatic steatosis. No hepatic mass identified. Portal vein is patent on color Doppler imaging with normal direction of blood flow towards the liver. IVC: No abnormality visualized. Pancreas: Visualized portion unremarkable. Spleen: Size and appearance within normal limits. Right Kidney: Length: 10.6 cm. Echogenicity within normal limits. No mass or hydronephrosis visualized. Left Kidney: Length: 11.3 cm. Echogenicity within normal limits. No mass or hydronephrosis visualized. Abdominal aorta: No aneurysm visualized. Other findings: None. IMPRESSION: Diffuse hepatic steatosis, without significant change since prior exam. No hepatic mass or other significant abnormality identified. No evidence of gallstones or biliary ductal dilatation. Electronically Signed   By: Marlaine Hind M.D.   On: 05/07/2021 18:41   DG Chest Port 1 View  Result Date: 05/07/2021 CLINICAL DATA:  Leg swelling EXAM: PORTABLE CHEST 1 VIEW COMPARISON:  Chest x-ray dated March 15, 2020 FINDINGS: Cardiac and mediastinal contours are unchanged. Clear lungs. No pleural effusion or pneumothorax. IMPRESSION: Clear lungs. Electronically Signed   By: Yetta Glassman MD   On: 05/07/2021 16:14    Lab Data:  CBC: Recent Labs  Lab 05/07/21 1443 05/07/21 1912 05/08/21 0401  WBC 18.5* 18.3* 19.1*  NEUTROABS 15.3*  --   --   HGB 7.6* 7.2* 8.1*  HCT 23.8* 22.3* 25.9*  MCV 106.7* 107.7* 108.8*  PLT 142* 122* 919*   Basic Metabolic Panel: Recent Labs  Lab 05/07/21 1443 05/07/21 2332  05/08/21 0401  NA 134* 133* 136  K 3.8 3.8 3.8  CL 103 103 105  CO2 _0 GLUCOSE 98 102* 97  BUN _1 CREATININE 1.32* 1.32* 1.35*  CALCIUM 8.6* 8.4* 8.4*  MG 1.8 1.7  --   PHOS  --  3.3  --    GFR: CrCl cannot be calculated (Unknown ideal weight.). Liver  Function Tests: Recent Labs  Lab 05/07/21 1443 05/07/21 2332 05/08/21 0401  AST 43* 52* 57*  ALT _0 ALKPHOS 209* 198* 195*  BILITOT 1.0 1.1 1.0  PROT 7.8 7.2 6.9  ALBUMIN 1.9* 1.8* 1.7*   Recent Labs  Lab 05/07/21 1443  LIPASE 26   Recent Labs  Lab 05/07/21 1618  AMMONIA 30   Coagulation Profile: Recent Labs  Lab 05/07/21 1443  INR 1.2   Cardiac Enzymes: No results for input(s): CKTOTAL, CKMB, CKMBINDEX, TROPONINI in the last 168 hours. BNP (last 3 results) No results for input(s): PROBNP in the last 8760 hours. HbA1C: No results for input(s): HGBA1C in the last 72 hours. CBG: No results for input(s): GLUCAP in the last 168 hours. Lipid Profile: No results for input(s): CHOL, HDL, LDLCALC, TRIG, CHOLHDL, LDLDIRECT in the last 72 hours. Thyroid Function Tests: No results for input(s): TSH, T4TOTAL, FREET4, T3FREE, THYROIDAB in the last 72 hours. Anemia Panel: Recent Labs    05/07/21 1704 05/07/21 1912  VITAMINB12 2,821*  --   FOLATE 7.3  --   FERRITIN 433*  --   TIBC 98*  --   IRON 69  --   RETICCTPCT  --  3.3*   Urine analysis:    Component Value Date/Time   COLORURINE YELLOW 05/07/2021 1702   APPEARANCEUR HAZY (A) 05/07/2021 1702   LABSPEC 1.005 05/07/2021 1702   PHURINE 6.0 05/07/2021 1702   GLUCOSEU NEGATIVE 05/07/2021 1702   HGBUR MODERATE (A) 05/07/2021 1702   BILIRUBINUR NEGATIVE 05/07/2021 1702   KETONESUR 5 (A) 05/07/2021 1702   PROTEINUR NEGATIVE 05/07/2021 1702   UROBILINOGEN 0.2 12/21/2014 1006   NITRITE POSITIVE (A) 05/07/2021 1702   LEUKOCYTESUR LARGE (A) 05/07/2021 1702     Donavin Audino M.D. [facility] 05/08/2021, 10:25 AM  Available via  Epic secure chat 7am-7pm After 7 pm, please refer to night coverage provider listed on amion.

## 2021-05-08 NOTE — ED Notes (Signed)
Pt has been cleaned up and fresh linen applied, pt now has on male purewic.

## 2021-05-08 NOTE — Progress Notes (Signed)
Pharmacy Antibiotic Note  [name] is a 35 y.o. male admitted on 05/07/2021 with AMS. Found to have sores and suspected cellulitis of lower extremities, as well as possible aspiration.  Pharmacy has been consulted for vancomycin dosing.  Plan: Vancomycin 2000 mg IV now, then 1250 mg IV q12 hr (est AUC 434 based on SCr 1.35; Vd 0.65) Measure vancomycin AUC at steady state as indicated SCr q48 while on vanc MRSA PCR ordered; f/u and narrow vanc as appropriate Rocephin per MD; dosing appropriate     Temp (24hrs), Avg:97.7 F (36.5 C), Min:97.4 F (36.3 C), Max:98 F (36.7 C)  Recent Labs  Lab 05/07/21 1443 05/07/21 1444 05/07/21 1912 05/07/21 2332 05/08/21 0401  WBC 18.5*  --  18.3*  --  19.1*  CREATININE 1.32*  --   --  1.32* 1.35*  LATICACIDVEN  --  0.8  --   --   --     CrCl cannot be calculated (Unknown ideal weight.).    Allergies  Allergen Reactions   Dapsone Other (See Comments)    Per centricity "G6PD deficient"   Primaquine Phosphate Other (See Comments)    Per Centricity "G6PD deficient"    Antimicrobials this admission: 7/30 vancomycin >>  7/30 ceftriaxone >>   Dose adjustments this admission: n/a  Microbiology results: 7/29 BCx: sent 7/29 UCx: sent   Thank you for allowing pharmacy to be a part of this patient's care.  Juandavid Dallman A 05/08/2021 1:46 PM

## 2021-05-09 DIAGNOSIS — L03119 Cellulitis of unspecified part of limb: Secondary | ICD-10-CM | POA: Diagnosis not present

## 2021-05-09 DIAGNOSIS — R7989 Other specified abnormal findings of blood chemistry: Secondary | ICD-10-CM | POA: Diagnosis not present

## 2021-05-09 DIAGNOSIS — G9341 Metabolic encephalopathy: Secondary | ICD-10-CM | POA: Diagnosis not present

## 2021-05-09 DIAGNOSIS — K2100 Gastro-esophageal reflux disease with esophagitis, without bleeding: Secondary | ICD-10-CM | POA: Diagnosis not present

## 2021-05-09 LAB — COMPREHENSIVE METABOLIC PANEL
ALT: 33 U/L (ref 0–44)
AST: 45 U/L — ABNORMAL HIGH (ref 15–41)
Albumin: 1.7 g/dL — ABNORMAL LOW (ref 3.5–5.0)
Alkaline Phosphatase: 190 U/L — ABNORMAL HIGH (ref 38–126)
Anion gap: 6 (ref 5–15)
BUN: 11 mg/dL (ref 6–20)
CO2: 24 mmol/L (ref 22–32)
Calcium: 8 mg/dL — ABNORMAL LOW (ref 8.9–10.3)
Chloride: 104 mmol/L (ref 98–111)
Creatinine, Ser: 1.57 mg/dL — ABNORMAL HIGH (ref 0.61–1.24)
GFR, Estimated: 58 mL/min — ABNORMAL LOW (ref >60)
Glucose, Bld: 102 mg/dL — ABNORMAL HIGH (ref 70–99)
Potassium: 3.4 mmol/L — ABNORMAL LOW (ref 3.5–5.1)
Sodium: 134 mmol/L — ABNORMAL LOW (ref 135–145)
Total Bilirubin: 0.6 mg/dL (ref 0.3–1.2)
Total Protein: 6.8 g/dL (ref 6.5–8.1)

## 2021-05-09 LAB — CBC
HCT: 20.9 % — ABNORMAL LOW (ref 39.0–52.0)
Hemoglobin: 6.3 g/dL — CL (ref 13.0–17.0)
MCH: 32.6 pg (ref 26.0–34.0)
MCHC: 30.1 g/dL (ref 30.0–36.0)
MCV: 108.3 fL — ABNORMAL HIGH (ref 80.0–100.0)
Platelets: 126 10*3/uL — ABNORMAL LOW (ref 150–400)
RBC: 1.93 MIL/uL — ABNORMAL LOW (ref 4.22–5.81)
RDW: 15.1 % (ref 11.5–15.5)
WBC: 17.7 10*3/uL — ABNORMAL HIGH (ref 4.0–10.5)
nRBC: 0 % (ref 0.0–0.2)

## 2021-05-09 LAB — PREPARE RBC (CROSSMATCH)

## 2021-05-09 LAB — URINE CULTURE

## 2021-05-09 MED ORDER — POTASSIUM CHLORIDE 20 MEQ PO PACK
40.0000 meq | PACK | Freq: Once | ORAL | Status: AC
  Administered 2021-05-09: 40 meq via ORAL
  Filled 2021-05-09: qty 2

## 2021-05-09 MED ORDER — SODIUM CHLORIDE 0.9% IV SOLUTION: Freq: Once | INTRAVENOUS | Status: AC

## 2021-05-09 MED ORDER — BENZONATATE 100 MG PO CAPS: 200.0000 mg | ORAL_CAPSULE | Freq: Three times a day (TID) | ORAL | Status: DC | PRN

## 2021-05-09 MED ORDER — NICOTINE 21 MG/24HR TD PT24
21.0000 mg | MEDICATED_PATCH | Freq: Every day | TRANSDERMAL | Status: DC
  Administered 2021-05-09 – 2021-05-19 (×12): 21 mg via TRANSDERMAL
  Filled 2021-05-09 (×12): qty 1

## 2021-05-09 MED ORDER — POTASSIUM CHLORIDE CRYS ER 20 MEQ PO TBCR: 40.0000 meq | EXTENDED_RELEASE_TABLET | Freq: Once | ORAL | Status: DC

## 2021-05-09 MED ORDER — OXYCODONE-ACETAMINOPHEN 5-325 MG PO TABS
1.0000 | ORAL_TABLET | Freq: Once | ORAL | Status: AC
  Administered 2021-05-09: 1 via ORAL
  Filled 2021-05-09: qty 1

## 2021-05-09 NOTE — Progress Notes (Signed)
Date and time results received: 05/09/21 0628   Test: hemoglobin at 6.3 Critical Value: 6.3  Name of Provider Notified: [facility] paged via [place]? Or Actions Taken?: No orders received at this time; RN will continue to monitor

## 2021-05-09 NOTE — Progress Notes (Signed)
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 Asc diam:  3.00 cm MITRAL VALVE               TRICUSPID VALVE MV Area (PHT): 3.99 cm    TR Peak grad:   11.7 mmHg MV Decel Time: 190 msec    TR Vmax:        171.00 cm/s MV E velocity: 75.90 cm/s MV A velocity: 93.60 cm/s  SHUNTS MV E/A ratio:  0.81        Systemic VTI:  0.22 m                            Systemic Diam: 2.20 cm Skeet Latch MD Electronically signed by Skeet Latch MD Signature Date/Time: 05/08/2021/3:58:48 PM    Final    DG HIP UNILAT WITH PELVIS 2-3 VIEWS LEFT  Result Date: 05/08/2021 CLINICAL DATA:  Brought in by EMS due to health concerns EXAM: DG HIP (WITH OR WITHOUT PELVIS) 2-3V LEFT COMPARISON:  CT abdomen pelvis 09/10/2020 FINDINGS: Unchanged chronic collapse and fracture of the left femoral head most consistent with osteonecrosis. No new fracture or dislocation. IMPRESSION: Chronic collapsed and fractured left femoral head similar to prior exam.  Electronically Signed   By: Miachel Roux M.D.   On: 05/08/2021 10:37   VAS [place] LOWER EXTREMITY VENOUS (DVT)  Result Date: 05/08/2021  Lower Venous DVT Study Patient Name:  VYRON FRONCZAK  Date of Exam:   05/08/2021 Medical Rec #: [id number]         Accession #:    [id number] Date of Birth: May 29, 1986         Patient Gender: M Patient Age:   35Y Exam Location:  [facility] Procedure:      VAS [place] LOWER EXTREMITY VENOUS (DVT) Referring Phys: [id number] Neleh Muldoon K Dezirea Mccollister --------------------------------------------------------------------------------  Indications: Edema.  Limitations: Poor ultrasound/tissue interface and body habitus. Comparison Study: No prior study Performing Technologist: Maudry Mayhew MHA, RDMS, RVT, RDCS  Examination Guidelines: A complete evaluation includes B-mode imaging, spectral Doppler, color Doppler, and power Doppler as needed of all accessible portions of each vessel. Bilateral testing is considered an integral part of a complete examination. Limited examinations for reoccurring indications may be performed as noted. The reflux portion of the exam is performed with the patient in reverse Trendelenburg.  +---------+---------------+---------+-----------+----------+--------------+ RIGHT    CompressibilityPhasicitySpontaneityPropertiesThrombus Aging +---------+---------------+---------+-----------+----------+--------------+ CFV      Full           Yes      Yes                                 +---------+---------------+---------+-----------+----------+--------------+ SFJ      Full                                                        +---------+---------------+---------+-----------+----------+--------------+ FV Prox  Full                                                        +---------+---------------+---------+-----------+----------+--------------+  FV Mid   Full                                                         +---------+---------------+---------+-----------+----------+--------------+ FV DistalFull                                                        +---------+---------------+---------+-----------+----------+--------------+ PFV      Full                                                        +---------+---------------+---------+-----------+----------+--------------+ POP      Full           Yes      Yes                                 +---------+---------------+---------+-----------+----------+--------------+ PTV      Full                    Yes                                 +---------+---------------+---------+-----------+----------+--------------+ PERO     Full                    Yes                                 +---------+---------------+---------+-----------+----------+--------------+   +---------+---------------+---------+-----------+----------+--------------+ LEFT     CompressibilityPhasicitySpontaneityPropertiesThrombus Aging +---------+---------------+---------+-----------+----------+--------------+ CFV      Full           Yes      Yes                                 +---------+---------------+---------+-----------+----------+--------------+ SFJ      Full                                                        +---------+---------------+---------+-----------+----------+--------------+ FV Prox  Full                                                        +---------+---------------+---------+-----------+----------+--------------+ FV Mid   Full                                                        +---------+---------------+---------+-----------+----------+--------------+  FV DistalFull                                                        +---------+---------------+---------+-----------+----------+--------------+ PFV      Full                                                         +---------+---------------+---------+-----------+----------+--------------+ POP      Full           Yes      Yes                                 +---------+---------------+---------+-----------+----------+--------------+ PTV                              Yes                                 +---------+---------------+---------+-----------+----------+--------------+   Left Technical Findings: Not visualized segments include limited evaluation PTV, unable to visualize peroneal veins.   Summary: RIGHT: - There is no evidence of deep vein thrombosis in the lower extremity. However, portions of this examination were limited- see technologist comments above.  - No cystic structure found in the popliteal fossa.  LEFT: - There is no evidence of deep vein thrombosis in the lower extremity. However, portions of this examination were limited- see technologist comments above.  - No cystic structure found in the popliteal fossa.  *See table(s) above for measurements and observations. Electronically signed by Deitra Mayo MD on 05/08/2021 at 6:42:23 PM.    Final     Lab Data:  CBC: Recent Labs  Lab 05/07/21 1443 05/07/21 1912 05/08/21 0401 05/09/21 0544  WBC 18.5* 18.3* 19.1* 17.7*  NEUTROABS 15.3*  --   --   --   HGB 7.6* 7.2* 8.1* 6.3*  HCT 23.8* 22.3* 25.9* 20.9*  MCV 106.7* 107.7* 108.8* 108.3*  PLT 142* 122* 116* 409*   Basic Metabolic Panel: Recent Labs  Lab 05/07/21 1443 05/07/21 2332 05/08/21 0401 05/09/21 0544  NA 134* 133* 136 134*  K 3.8 3.8 3.8 3.4*  CL 103 103 105 104  CO2 23 22 23 24   GLUCOSE 98 102* 97 102*  BUN 13 12 12 11   CREATININE 1.32* 1.32* 1.35* 1.57*  CALCIUM 8.6* 8.4* 8.4* 8.0*  MG 1.8 1.7  --   --   PHOS  --  3.3  --   --    GFR: Estimated Creatinine Clearance: 85.9 mL/min (A) (by C-G formula based on SCr of 1.57 mg/dL (H)). Liver Function Tests: Recent Labs  Lab 05/07/21 1443 05/07/21 2332 05/08/21 0401 05/09/21 0544  AST 43* 52* 57* 45*   ALT 28 29 30  33  ALKPHOS 209* 198* 195* 190*  BILITOT 1.0 1.1 1.0 0.6  PROT 7.8 7.2 6.9 6.8  ALBUMIN 1.9* 1.8* 1.7* 1.7*   Recent Labs  Lab 05/07/21 1443  LIPASE 26   Recent Labs  Lab 05/07/21 1618  AMMONIA 30  Coagulation Profile: Recent Labs  Lab 05/07/21 1443  INR 1.2   Cardiac Enzymes: No results for input(s): CKTOTAL, CKMB, CKMBINDEX, TROPONINI in the last 168 hours. BNP (last 3 results) No results for input(s): PROBNP in the last 8760 hours. HbA1C: No results for input(s): HGBA1C in the last 72 hours. CBG: No results for input(s): GLUCAP in the last 168 hours. Lipid Profile: No results for input(s): CHOL, HDL, LDLCALC, TRIG, CHOLHDL, LDLDIRECT in the last 72 hours. Thyroid Function Tests: Recent Labs    05/08/21 0910  TSH 1.197   Anemia Panel: Recent Labs    05/07/21 1704 05/07/21 1912  VITAMINB12 2,821*  --   FOLATE 7.3  --   FERRITIN 433*  --   TIBC 98*  --   IRON 69  --   RETICCTPCT  --  3.3*   Urine analysis:    Component Value Date/Time   COLORURINE YELLOW 05/07/2021 1702   APPEARANCEUR HAZY (A) 05/07/2021 1702   LABSPEC 1.005 05/07/2021 1702   PHURINE 6.0 05/07/2021 1702   GLUCOSEU NEGATIVE 05/07/2021 1702   HGBUR MODERATE (A) 05/07/2021 1702   BILIRUBINUR NEGATIVE 05/07/2021 1702   KETONESUR 5 (A) 05/07/2021 1702   PROTEINUR NEGATIVE 05/07/2021 1702   UROBILINOGEN 0.2 12/21/2014 1006   NITRITE POSITIVE (A) 05/07/2021 1702   LEUKOCYTESUR LARGE (A) 05/07/2021 1702     Annjanette Wertenberger M.D. [facility] 05/09/2021, 12:14 PM  Available via Epic secure chat 7am-7pm After 7 pm, please refer to night coverage provider listed on amion.

## 2021-05-10 ENCOUNTER — Inpatient Hospital Stay (HOSPITAL_COMMUNITY): Payer: Medicare HMO

## 2021-05-10 ENCOUNTER — Encounter (HOSPITAL_COMMUNITY)
Admission: RE | Admit: 2021-05-10 | Discharge: 2021-05-10 | Disposition: A | Discharge status: Home / Self Care | Payer: Medicare HMO | Source: Ambulatory Visit | Attending: Internal Medicine | Admitting: Internal Medicine

## 2021-05-10 DIAGNOSIS — G9341 Metabolic encephalopathy: Secondary | ICD-10-CM | POA: Diagnosis not present

## 2021-05-10 DIAGNOSIS — B20 Human immunodeficiency virus [HIV] disease: Secondary | ICD-10-CM | POA: Diagnosis not present

## 2021-05-10 DIAGNOSIS — K2100 Gastro-esophageal reflux disease with esophagitis, without bleeding: Secondary | ICD-10-CM | POA: Diagnosis not present

## 2021-05-10 DIAGNOSIS — L03119 Cellulitis of unspecified part of limb: Secondary | ICD-10-CM | POA: Diagnosis not present

## 2021-05-10 LAB — COMPREHENSIVE METABOLIC PANEL
ALT: 31 U/L (ref 0–44)
AST: 42 U/L — ABNORMAL HIGH (ref 15–41)
Albumin: 1.6 g/dL — ABNORMAL LOW (ref 3.5–5.0)
Alkaline Phosphatase: 163 U/L — ABNORMAL HIGH (ref 38–126)
Anion gap: 9 (ref 5–15)
BUN: 12 mg/dL (ref 6–20)
CO2: 22 mmol/L (ref 22–32)
Calcium: 8 mg/dL — ABNORMAL LOW (ref 8.9–10.3)
Chloride: 103 mmol/L (ref 98–111)
Creatinine, Ser: 1.71 mg/dL — ABNORMAL HIGH (ref 0.61–1.24)
GFR, Estimated: 52 mL/min — ABNORMAL LOW (ref >60)
Glucose, Bld: 102 mg/dL — ABNORMAL HIGH (ref 70–99)
Potassium: 3.2 mmol/L — ABNORMAL LOW (ref 3.5–5.1)
Sodium: 134 mmol/L — ABNORMAL LOW (ref 135–145)
Total Bilirubin: 1.1 mg/dL (ref 0.3–1.2)
Total Protein: 6.6 g/dL (ref 6.5–8.1)

## 2021-05-10 LAB — CBC
HCT: 19.9 % — ABNORMAL LOW (ref 39.0–52.0)
Hemoglobin: 6.2 g/dL — CL (ref 13.0–17.0)
MCH: 32.3 pg (ref 26.0–34.0)
MCHC: 31.2 g/dL (ref 30.0–36.0)
MCV: 103.6 fL — ABNORMAL HIGH (ref 80.0–100.0)
Platelets: 128 10*3/uL — ABNORMAL LOW (ref 150–400)
RBC: 1.92 MIL/uL — ABNORMAL LOW (ref 4.22–5.81)
RDW: 18.9 % — ABNORMAL HIGH (ref 11.5–15.5)
WBC: 19.8 10*3/uL — ABNORMAL HIGH (ref 4.0–10.5)
nRBC: 0 % (ref 0.0–0.2)

## 2021-05-10 LAB — VANCOMYCIN, TROUGH: Vancomycin Tr: 43 ug/mL (ref 15–20)

## 2021-05-10 LAB — PREPARE RBC (CROSSMATCH)

## 2021-05-10 MED ORDER — OXYCODONE-ACETAMINOPHEN 5-325 MG PO TABS
1.0000 | ORAL_TABLET | Freq: Four times a day (QID) | ORAL | Status: AC | PRN
  Administered 2021-05-11 (×2): 1 via ORAL
  Filled 2021-05-10 (×2): qty 1

## 2021-05-10 MED ORDER — VANCOMYCIN VARIABLE DOSE PER UNSTABLE RENAL FUNCTION (PHARMACIST DOSING): Status: DC

## 2021-05-10 MED ORDER — POTASSIUM CHLORIDE CRYS ER 20 MEQ PO TBCR
40.0000 meq | EXTENDED_RELEASE_TABLET | Freq: Once | ORAL | Status: AC
  Administered 2021-05-10: 40 meq via ORAL
  Filled 2021-05-10: qty 2

## 2021-05-10 MED ORDER — ALPRAZOLAM 1 MG PO TABS
2.0000 mg | ORAL_TABLET | Freq: Three times a day (TID) | ORAL | Status: DC
  Administered 2021-05-10 – 2021-05-11 (×4): 2 mg via ORAL
  Administered 2021-05-11: 1 mg via ORAL
  Administered 2021-05-12 – 2021-05-18 (×15): 2 mg via ORAL
  Administered 2021-05-18: 1 mg via ORAL
  Administered 2021-05-18 – 2021-05-19 (×2): 2 mg via ORAL
  Filled 2021-05-10 (×27): qty 2

## 2021-05-10 MED ORDER — SODIUM CHLORIDE 0.9% IV SOLUTION: Freq: Once | INTRAVENOUS | Status: AC

## 2021-05-10 MED ORDER — LACTATED RINGERS IV SOLN: INTRAVENOUS | Status: DC

## 2021-05-10 MED ORDER — GERHARDT'S BUTT CREAM
TOPICAL_CREAM | Freq: Two times a day (BID) | CUTANEOUS | Status: DC
  Administered 2021-05-12 – 2021-05-17 (×3): 1 via TOPICAL
  Filled 2021-05-10: qty 1

## 2021-05-10 MED ORDER — THIAMINE HCL 100 MG PO TABS
250.0000 mg | ORAL_TABLET | Freq: Every day | ORAL | Status: DC
  Administered 2021-05-10 – 2021-05-19 (×10): 250 mg via ORAL
  Filled 2021-05-10 (×11): qty 3

## 2021-05-10 MED ORDER — LURASIDONE HCL 40 MG PO TABS
120.0000 mg | ORAL_TABLET | Freq: Every day | ORAL | Status: DC
  Administered 2021-05-10 – 2021-05-18 (×9): 120 mg via ORAL
  Filled 2021-05-10 (×10): qty 3

## 2021-05-10 MED ORDER — OXYCODONE-ACETAMINOPHEN 5-325 MG PO TABS
1.0000 | ORAL_TABLET | Freq: Once | ORAL | Status: DC
  Filled 2021-05-10 (×5): qty 1

## 2021-05-10 MED ORDER — IOHEXOL 350 MG/ML SOLN
100.0000 mL | Freq: Once | INTRAVENOUS | Status: AC | PRN
  Administered 2021-05-10: 100 mL via INTRAVENOUS

## 2021-05-10 MED ORDER — ESCITALOPRAM OXALATE 20 MG PO TABS
20.0000 mg | ORAL_TABLET | Freq: Every day | ORAL | Status: DC
  Administered 2021-05-10 – 2021-05-19 (×10): 20 mg via ORAL
  Filled 2021-05-10 (×10): qty 1

## 2021-05-10 MED ORDER — OXYCODONE-ACETAMINOPHEN 5-325 MG PO TABS: 1.0000 | ORAL_TABLET | ORAL | Status: DC | PRN

## 2021-05-10 NOTE — Progress Notes (Signed)
Pharmacy Antibiotic Note  [name] is a 35 y.o. male with hx HIV presented o the ED on 05/07/2021 with c/o leg swelling. LE doppler was negative for DVT.  He's currently on vancomycin and ceftriaxone for LE cellulitis/wound.  Today, 05/10/2021: - day #3 abx - Tmax 99.9, wbc 19.8 - scr up 1.71 (crcl~60N) - vancomycin trough level is supratherapeutic at 43 (goal 10-15)  Plan: - hold vancomycin for now. Will f/u with renal function and re-dose if/when appropriate - ceftriaxone 2gm IV q24h ________________________________  Height: 6' 3"  (190.5 cm) Weight: 109.1 kg (240 lb 8.4 oz) IBW/kg (Calculated) : 84.5  Temp (24hrs), Avg:99.3 F (37.4 C), Min:98.9 F (37.2 C), Max:99.9 F (37.7 C)  Recent Labs  Lab 05/07/21 1443 05/07/21 1444 05/07/21 1912 05/07/21 2332 05/08/21 0401 05/09/21 0544 05/10/21 0416  WBC 18.5*  --  18.3*  --  19.1* 17.7* 19.8*  CREATININE 1.32*  --   --  1.32* 1.35* 1.57* 1.71*  LATICACIDVEN  --  0.8  --   --   --   --   --     Estimated Creatinine Clearance: 78.9 mL/min (A) (by C-G formula based on SCr of 1.71 mg/dL (H)).    Allergies  Allergen Reactions   Dapsone Other (See Comments)    Per centricity "G6PD deficient"   Primaquine Phosphate Other (See Comments)    Per Centricity "G6PD deficient"     Thank you for allowing pharmacy to be a part of this patient's care.  Lynelle Doctor 05/10/2021 10:25 AM

## 2021-05-10 NOTE — Progress Notes (Signed)
RN cleansed wounds, applied ABD pads, and used butt cream per [facility]OC RN order.

## 2021-05-10 NOTE — Consult Note (Signed)
[facility] Face to-Face Psychiatry Consult   Reason for Consult:  acute encephalopathy, has underlying heavy alcohol abuse, schizoaffective disorder, bipolar disorder.  Please evaluate and medication adjustment.- Referring Physician:  Dr. Tana Coast Patient Identification: [name] MRN:  [id number] Principal Diagnosis: Acute metabolic encephalopathy Diagnosis:  Principal Problem:   Acute metabolic encephalopathy Active Problems:   Schizoaffective disorder, depressive type (HCC)   Severe alcohol dependence (HCC)   GERD (gastroesophageal reflux disease)   Elevated LFTs   HIV disease (HCC)   Anemia   Cellulitis   Total Time spent with patient: 30 minutes  Subjective:   [name] is a 35 y.o. male patient admitted with .  Acute encephalopathy.  Per EMS report brother called 911 due to patient not taking care of self lately, was found lying on a couch covered in feces and urine, not been eating or taking meds recently.  Patient was surrounded by alcohol bottles.  Psych consult was placed due to the concerns noted above.  Patient is seen and assessed by this nurse practitioner.  At the time patient denies much of the above-noted information, he reports he has been very compliant with his medication and not sure what happened.  He states he is also been compliant with his outpatient psychiatric visits, and has been seen monthly.  He states he is currently scheduled to take lurasidone 120 mg, alprazolam 2 mg, and Lexapro 20 mg.  Patient states he is no longer on Haldol and he has been on lurasidone for several months.  He denies any current or acute psychiatric symptoms to include depression, mania, paranoia, psychosis, hallucinations.  He further denies any current or active suicidal ideations, suicidal thoughts, and or self-harm behaviors.  He does admit to current psychiatric diagnosis, however he states he has been compliant with this medication.  He further states" my medication should be in my  system to confirm compliance."    On evaluation patient is alert and oriented, calm and cooperative, and shows much willingness to engage with psychiatric provider.  He is very jovial, shows motivation to get better, and remembers this provider from previous inpatient hospitalizations.  Patient denies any acute or current psychiatric symptoms, he is unable to explain what led up to this current admission.  Although he does report compliance with his outpatient psychiatric provider and prescriptions.  He states he is seeing Dr. Acquanetta Chain, at [address] monthly for his [place], [place], and Xanax.  He reports compliance with both medication, and is requesting his medications be resumed at this time.  He does minimize his alcohol use.  He denies any acute or active suicidal ideations.  He appears to answer all questions appropriately, does not appear to be exhibiting any psychosis or mania.  He does not appear to be responding to internal stimuli, external stimuli, and or having hallucinations.  He is able to hold a linear conversation.  At this time patient appears to be psychiatrically stable, to follow up with outpatient psychiatric resources.  HPI:  Patient is a 35 year old male with history of HIV, PTSD, schizoaffective disorder, seizures, heavy alcohol abuse, hypertension was brought to ED via EMS for altered mental status.  I was unable to obtain any history from the patient due to his mental status (whispering and rambling).  He was able to tell me his name.  Per EMS and EDP records, patient lives alone.  Patient's family called EMS because he was not taking his medications.  He was laying on the  couch for the last 2 weeks, covered in feces and urine.  Patient was surrounded by many alcohol bottles.  He was also noted to have increased lower extremity swelling, sores and redness to the feet.  Past Psychiatric History: Bipolar disorder, schizoaffective disorder, anxiety disorder.  History of multiple  inpatient hospitalizations, last known admission October 2019.  Risk to Self: Denies risk to Others: Denies Prior Inpatient Therapy: October 2019 at [facility] Prior Outpatient Therapy: Dr. Acquanetta Chain  Past Medical History:  Past Medical History:  Diagnosis Date   Bipolar 1 disorder ([place])    Depression    Dizziness and giddiness 02/01/2016   Herpes genitalia    HIV disease ([place])    Hypertension    Migraine headache 02/01/2016   Peripheral neuropathy 10/01/2019   PTSD (post-traumatic stress disorder)    Schizoaffective disorder ([place])    Seizures ([place])     Past Surgical History:  Procedure Laterality Date   BACK SURGERY     BIOPSY  02/26/2021   Procedure: BIOPSY;  Surgeon: Otis Brace, MD;  Location: [facility];  Service: Gastroenterology;;   COLONOSCOPY WITH PROPOFOL [name] 02/26/2021   Procedure: COLONOSCOPY WITH PROPOFOL;  Surgeon: Otis Brace, MD;  Location: [facility];  Service: Gastroenterology;  Laterality: [name];   ESOPHAGOGASTRODUODENOSCOPY (EGD) WITH PROPOFOL [name] 02/26/2021   Procedure: ESOPHAGOGASTRODUODENOSCOPY (EGD) WITH PROPOFOL;  Surgeon: Otis Brace, MD;  Location: [facility];  Service: Gastroenterology;  Laterality: [name];   HAND SURGERY     Family History:  Family History  Problem Relation Age of Onset   Alcohol abuse Mother    Schizophrenia Father    Depression Father    Alcohol abuse Father    Alcohol abuse Paternal Uncle    Alcohol abuse Paternal Uncle    Family Psychiatric  History: As per patient mother has diagnosis of bipolar schizophrenia Social History:  Social History   Substance and Sexual Activity  Alcohol Use Yes   Alcohol/week: 14.0 standard drinks   Types: 14 Cans of beer per week   Comment: 5-6 40oz per day     Social History   Substance and Sexual Activity  Drug Use No    Social History   Socioeconomic History   Marital status: Single    Spouse name: Not on file   Number of children: Not on  file   Years of education: Not on file   Highest education level: Not on file  Occupational History   Occupation: [employer]  Tobacco Use   Smoking status: Every Day    Packs/day: 1.00    Types: Cigarettes   Smokeless tobacco: Never  Vaping Use   Vaping Use: Never used  Substance and Sexual Activity   Alcohol use: Yes    Alcohol/week: 14.0 standard drinks    Types: 14 Cans of beer per week    Comment: 5-6 40oz per day   Drug use: No   Sexual activity: Not Currently    Birth control/protection: Condom  Other Topics Concern   Not on file  Social History Narrative   Lives at home alone   Right-handed   Drinks 2-3 sodas per day   Social Determinants of Health   Financial Resource Strain: Not on file  Food Insecurity: Not on file  Transportation Needs: Not on file  Physical Activity: Not on file  Stress: Not on file  Social Connections: Not on file   Additional Social History:    Allergies:   Allergies  Allergen Reactions  Dapsone Other (See Comments)    Per centricity "G6PD deficient"   Primaquine Phosphate Other (See Comments)    Per Centricity "G6PD deficient"    Labs:  Results for orders placed or performed during the hospital encounter of 05/07/21 (from the past 48 hour(s))  Comprehensive metabolic panel     Status: Abnormal   Collection Time: 05/09/21  5:44 AM  Result Value Ref Range   Sodium 134 (L) 135 - 145 mmol/L   Potassium 3.4 (L) 3.5 - 5.1 mmol/L   Chloride 104 98 - 111 mmol/L   CO2 24 22 - 32 mmol/L   Glucose, Bld 102 (H) 70 - 99 mg/dL    Comment: Glucose reference range applies only to samples taken after fasting for at least 8 hours.   BUN 11 6 - 20 mg/dL   Creatinine, Ser 1.57 (H) 0.61 - 1.24 mg/dL   Calcium 8.0 (L) 8.9 - 10.3 mg/dL   Total Protein 6.8 6.5 - 8.1 g/dL   Albumin 1.7 (L) 3.5 - 5.0 g/dL   AST 45 (H) 15 - 41 U/L   ALT 33 0 - 44 U/L   Alkaline Phosphatase 190 (H) 38 - 126 U/L   Total Bilirubin 0.6 0.3 - 1.2 mg/dL   GFR,  Estimated 58 (L) >60 mL/min    Comment: (NOTE) Calculated using the CKD-EPI Creatinine Equation (2021)    Anion gap 6 5 - 15    Comment: Performed at [facility], [place] [address], [place], [place] [address]  CBC     Status: Abnormal   Collection Time: 05/09/21  5:44 AM  Result Value Ref Range   WBC 17.7 (H) 4.0 - 10.5 K/uL   RBC 1.93 (L) 4.22 - 5.81 MIL/uL   Hemoglobin 6.3 (LL) 13.0 - 17.0 g/dL    Comment: This critical result has verified and been called to MONICA DAVIS RN by Marvell Fuller on 07 31 2022 at [place], and has been read back. CRITICAL RESULT VERIFIED   HCT 20.9 (L) 39.0 - 52.0 %   MCV 108.3 (H) 80.0 - 100.0 fL   MCH 32.6 26.0 - 34.0 pg   MCHC 30.1 30.0 - 36.0 g/dL   RDW 15.1 11.5 - 15.5 %   Platelets 126 (L) 150 - 400 K/uL    Comment: Immature Platelet Fraction may be clinically indicated, consider ordering this additional test [id number]    nRBC 0.0 0.0 - 0.2 %    Comment: Performed at [facility], [place] [address] [place], [place] [address]  Type and screen [place]     Status: None (Preliminary result)   Collection Time: 05/09/21  8:03 AM  Result Value Ref Range   ABO/RH(D) O POS    Antibody Screen NEG    Sample Expiration      05/12/2021,2359 Performed at [facility], [address] [place], [place] [address]    Unit Number [id number]    Blood Component Type RED CELLS,LR    Unit division 00    Status of Unit ISSUED    Transfusion Status OK TO TRANSFUSE    Crossmatch Result Compatible    Unit Number [id number]    Blood Component Type RED CELLS,LR    Unit division 00    Status of Unit ISSUED    Transfusion Status OK TO TRANSFUSE    Crossmatch Result Compatible   Prepare RBC (crossmatch)     Status: None   Collection Time: 05/09/21  8:03  AM  Result Value Ref Range   Order Confirmation      ORDER PROCESSED BY BLOOD BANK Performed at [place]  [address], [place], [place] [address]   Comprehensive metabolic panel     Status: Abnormal   Collection Time: 05/10/21  4:16 AM  Result Value Ref Range   Sodium 134 (L) 135 - 145 mmol/L   Potassium 3.2 (L) 3.5 - 5.1 mmol/L   Chloride 103 98 - 111 mmol/L   CO2 22 22 - 32 mmol/L   Glucose, Bld 102 (H) 70 - 99 mg/dL    Comment: Glucose reference range applies only to samples taken after fasting for at least 8 hours.   BUN 12 6 - 20 mg/dL   Creatinine, Ser 1.71 (H) 0.61 - 1.24 mg/dL   Calcium 8.0 (L) 8.9 - 10.3 mg/dL   Total Protein 6.6 6.5 - 8.1 g/dL   Albumin 1.6 (L) 3.5 - 5.0 g/dL   AST 42 (H) 15 - 41 U/L   ALT 31 0 - 44 U/L   Alkaline Phosphatase 163 (H) 38 - 126 U/L   Total Bilirubin 1.1 0.3 - 1.2 mg/dL   GFR, Estimated 52 (L) >60 mL/min    Comment: (NOTE) Calculated using the CKD-EPI Creatinine Equation (2021)    Anion gap 9 5 - 15    Comment: Performed at [facility], [place] [address], [place], [place] [address]  CBC     Status: Abnormal   Collection Time: 05/10/21  4:16 AM  Result Value Ref Range   WBC 19.8 (H) 4.0 - 10.5 K/uL   RBC 1.92 (L) 4.22 - 5.81 MIL/uL   Hemoglobin 6.2 (LL) 13.0 - 17.0 g/dL    Comment: CRITICAL VALUE NOTED.  VALUE IS CONSISTENT WITH PREVIOUSLY REPORTED AND CALLED VALUE.   HCT 19.9 (L) 39.0 - 52.0 %   MCV 103.6 (H) 80.0 - 100.0 fL   MCH 32.3 26.0 - 34.0 pg   MCHC 31.2 30.0 - 36.0 g/dL   RDW 18.9 (H) 11.5 - 15.5 %   Platelets 128 (L) 150 - 400 K/uL    Comment: Immature Platelet Fraction may be clinically indicated, consider ordering this additional test [id number]    nRBC 0.0 0.0 - 0.2 %    Comment: Performed at [facility], [place] [address] [place], [place] [address]  Prepare RBC (crossmatch)     Status: None   Collection Time: 05/10/21  5:27 AM  Result Value Ref Range   Order Confirmation      ORDER PROCESSED BY BLOOD BANK Performed at [place] [address] [place], [place]  [address]     Current Facility-Administered Medications  Medication Dose Route Frequency Provider Last Rate Last Admin   acetaminophen (TYLENOL) tablet 650 mg  650 mg Oral Q6H PRN Rai, Ripudeep K, MD   650 mg at 05/09/21 1031   Or   acetaminophen (TYLENOL) suppository 650 mg  650 mg Rectal Q6H PRN Rai, Ripudeep K, MD       albuterol (PROVENTIL) (2.5 MG/3ML) 0.083% nebulizer solution 2.5 mg  2.5 mg Nebulization Q6H PRN Wofford, Drew A, RPH       benzonatate (TESSALON) capsule 200 mg  200 mg Oral TID PRN Rai, Ripudeep K, MD       bictegravir-emtricitabine-tenofovir AF (BIKTARVY) 50-200-25 MG per tablet 1 tablet  1 tablet Oral Daily Rai, Ripudeep K, MD   1 tablet at 05/09/21 1039   cefTRIAXone (  ROCEPHIN) 2 g in sodium chloride 0.9 % 100 mL IVPB  2 g Intravenous Q24H Rai, Ripudeep K, MD 200 mL/hr at 05/09/21 1855 2 g at 05/09/21 1855   famotidine (PEPCID) tablet 40 mg  40 mg Oral Daily Rai, Ripudeep K, MD   40 mg at 03/47/42 5956   folic acid (FOLVITE) tablet 1 mg  1 mg Oral Daily Rai, Ripudeep K, MD   1 mg at 05/09/21 1031   Gerhardt's butt cream   Topical BID Rai, Ripudeep K, MD       guaiFENesin (MUCINEX) 12 hr tablet 1,200 mg  1,200 mg Oral BID Rai, Ripudeep K, MD   1,200 mg at 05/09/21 2107   haloperidol (HALDOL) tablet 5 mg  5 mg Oral BID PRN Suella Broad, FNP       Or   haloperidol lactate (HALDOL) injection 5 mg  5 mg Intravenous BID PRN Suella Broad, FNP       heparin injection 5,000 Units  5,000 Units Subcutaneous Q8H Rai, Ripudeep K, MD   5,000 Units at 05/10/21 0521   LORazepam (ATIVAN) tablet 1-4 mg  1-4 mg Oral Q1H PRN Rai, Ripudeep K, MD       Or   LORazepam (ATIVAN) injection 1-4 mg  1-4 mg Intravenous Q1H PRN Rai, Ripudeep K, MD       multivitamin with minerals tablet 1 tablet  1 tablet Oral Daily Rai, Ripudeep K, MD   1 tablet at 05/09/21 1031   nicotine (NICODERM CQ - dosed in mg/24 hours) patch 21 mg  21 mg Transdermal Daily Lang Snow, NP   21 mg  at 05/09/21 1030   pantoprazole (PROTONIX) EC tablet 40 mg  40 mg Oral BID Rai, Ripudeep K, MD   40 mg at 05/09/21 2107   thiamine 566m in normal saline (511m IVPB  500 mg Intravenous Q8H Rai, Ripudeep K, MD 100 mL/hr at 05/10/21 0516 500 mg at 05/10/21 0516   traZODone (DESYREL) tablet 200 mg  200 mg Oral QHS PRN Rai, Ripudeep K, MD   200 mg at 05/09/21 2107   valACYclovir (VALTREX) tablet 1,000 mg  1,000 mg Oral Daily Rai, Ripudeep K, MD   1,000 mg at 05/09/21 1037   vancomycin variable dose per unstable renal function (pharmacist dosing)   Does not apply See admin instructions PhLynelle DoctorRPH        Musculoskeletal: Strength & Muscle Tone: within normal limits Gait & Station:  Unable to assess patient lying in bed Patient leans: [name]            Psychiatric Specialty Exam:  Presentation  General Appearance: Appropriate for Environment; Casual  Eye Contact:Good  Speech:Clear and Coherent; Normal Rate  Speech Volume:Normal  Handedness:Right   Mood and Affect  Mood:Euthymic  Affect:Appropriate; Congruent   Thought Process  Thought Processes:Coherent; Goal Directed; Linear  Descriptions of Associations:Intact  Orientation:Full (Time, Place and Person)  Thought Content:Logical  History of Schizophrenia/Schizoaffective disorder:No data recorded Duration of Psychotic Symptoms:No data recorded Hallucinations:Hallucinations: None  Ideas of Reference:None  Suicidal Thoughts:Suicidal Thoughts: No  Homicidal Thoughts:Homicidal Thoughts: No   Sensorium  Memory:Immediate Fair; Recent Fair; Remote Fair  Judgment:Fair  Insight:Fair   Executive Functions  Concentration:Fair  Attention Span:Fair  [place] Psychomotor Activity  Psychomotor Activity:Psychomotor Activity: Normal   Assets  Assets:Desire for Improvement; Financial Resources/Insurance; Leisure Time; Intimacy; TrActorPhysical Health   Sleep  Sleep:Sleep: Fair  Physical Exam: Physical Exam ROS Blood pressure 107/60, pulse 98, temperature 98.9 F (37.2 C), temperature source Oral, resp. rate 16, height 6' 3"  (1.905 m), weight 109.1 kg, SpO2 100 %. Body mass index is 30.06 kg/m.  Treatment Plan Summary: Plan Will resume home medication of lurasidone 120 mg p.o. with evening meal, Lexapro 20 mg p.o. daily for depression and anxiety.  Patient also requests resuming his alprazolam, which was verified in PDMP.  He is currently prescribed alprazolam 2 mg p.o. 3 times daily by Dr. Acquanetta Chain.  Urine drug screen was not obtained on admission, therefore unable to determine if patient was compliant with this medication.  Patient's clinical presentation of acute metabolic encephalopathy is likely multifactorial due to infection, polysubstance(benzodiazepine, opiates, and alcohol), and underlying psychiatric condition. Patient will benefit from chemical dependency intensive outpatient programming, an in-depth review of polypharmacy which can lead to respiratory depression.  -Will resume home medications.  New orders to be placed for lurasidone 120 mg p.o. with evening meal, Lexapro 20 mg p.o. daily, and alprazolam 2 mg p.o. 3 times daily.  Will resume home medications, please do not discharge patient with prescription for alprazolam.  Patient on the second day of admission, out of window to obtain urine drug screen to check for compliance. -Will discontinue agitation protocol, as patient does not appear to be exhibiting any disruptive behaviors, aggression, agitation, and or combativeness.  His altered mental status, and acute encephalopathy also appeared to be resolving. -EKG obtained yesterday QTC of 456. -Recommend TOC consult, for outpatient referral for alcohol dependence and drug treatment program.   Psychiatry to sign off at this time. Disposition: No evidence of imminent risk to self or others at  present.   Patient does not meet criteria for psychiatric inpatient admission. Supportive therapy provided about ongoing stressors. Refer to IOP. Discussed crisis plan, support from social network, calling 911, coming to the Emergency Department, and calling Suicide Hotline.  Suella Broad, FNP 05/10/2021 10:39 AM

## 2021-05-10 NOTE — Progress Notes (Signed)
Brief ID Note.  Full consult note to follow   35 yo man with HIV followed at [facility] and on [place].  Recent HIV labs from January 2022 with VL just 21 copies and healthy CD4 count.  He presents now to [facility] via EMS on 7/29 with encephalopathy.  He was found at home surrounded by EtOH bottles with LE edema and bilateral leg wounds.  Reportedly has been off his medications.  WOC was consulted but felt that scope was more appropriate for surgical and ID consultation which has been obtained.  He was continued on Biktarvy and placed on Ceftriaxone and Vancomycin.  Surgery has ordered imaging to better evaluate possible abscesses and this is pending.   Labs at admit:  albumin 1.9, AST 43, ALT 28, creatinine 1.3.  WBC 18.5, hemoglobin 7.6.  Blood cx NGTD.  CD4 count pending.  VL not done.   Plan -- continue Biktarvy.  Check HIV RNA.  Await imaging planned by surgery and continue with vancomycin/ceftriaxone for now.   Raynelle Highland for Infectious Disease [facility] 05/10/2021, 4:44 PM

## 2021-05-10 NOTE — Consult Note (Signed)
ConsultNote  [name] [birth year]  [id number].    Requesting MD: Dr. Estill Cotta  Chief Complaint/Reason for Consult: buttock and thigh cellulitis with concern for undrained abscesses HPI:  Patient is a 35 year old male who was brought in to [facility] by EMS 7/29 with concern for altered mental status. PMH significant for HIV, PTSD, schizoaffective disorder, seizures, heavy alcohol abuse, HTN. Patient reportedly lives alone and family called EMS because he was not taking his medications. He reportedly was found lying on the couch in urine and feces. Patient was surrounded by many alcohol bottles. He was noted to have increased LE edema with sores and redness of feet. He has wounds noted to bilateral buttocks and thighs with concern for possible underlying abscesses. General surgery asked to evaluate - at the time of my examination patient had just received pain medications and was minimally participatory in history and exam. Patient reportedly in noncompliant with antiretrovirals.   ROS: Review of Systems  Unable to perform ROS: Mental status change (Patient just received pain medications and was minimally participatory)   Family History  Problem Relation Age of Onset   Alcohol abuse Mother    Schizophrenia Father    Depression Father    Alcohol abuse Father    Alcohol abuse Paternal Uncle    Alcohol abuse Paternal Uncle     Past Medical History:  Diagnosis Date   Bipolar 1 disorder ([place])    Depression    Dizziness and giddiness 02/01/2016   Herpes genitalia    HIV disease ([place])    Hypertension    Migraine headache 02/01/2016   Peripheral neuropathy 10/01/2019   PTSD (post-traumatic stress disorder)    Schizoaffective disorder ([place])    Seizures ([place])     Past Surgical History:  Procedure Laterality Date   BACK SURGERY     BIOPSY  02/26/2021   Procedure: BIOPSY;  Surgeon: Otis Brace, MD;  Location: [facility];  Service: Gastroenterology;;   COLONOSCOPY  WITH PROPOFOL [name] 02/26/2021   Procedure: COLONOSCOPY WITH PROPOFOL;  Surgeon: Otis Brace, MD;  Location: [facility];  Service: Gastroenterology;  Laterality: [name];   ESOPHAGOGASTRODUODENOSCOPY (EGD) WITH PROPOFOL [name] 02/26/2021   Procedure: ESOPHAGOGASTRODUODENOSCOPY (EGD) WITH PROPOFOL;  Surgeon: Otis Brace, MD;  Location: [facility];  Service: Gastroenterology;  Laterality: [name];   HAND SURGERY      Social History:  reports that he has been smoking cigarettes. He has been smoking an average of 1 pack per day. He has never used smokeless tobacco. He reports current alcohol use of about 14.0 standard drinks of alcohol per week. He reports that he does not use drugs.  Allergies:  Allergies  Allergen Reactions   Dapsone Other (See Comments)    Per centricity "G6PD deficient"   Primaquine Phosphate Other (See Comments)    Per Centricity "G6PD deficient"    Medications Prior to Admission  Medication Sig Dispense Refill   bictegravir-emtricitabine-tenofovir AF (BIKTARVY) 50-200-25 MG TABS tablet Take 1 tablet by mouth daily.      albuterol (PROVENTIL HFA;VENTOLIN HFA) 108 (90 BASE) MCG/ACT inhaler Inhale 2 puffs into the lungs every 6 (six) hours as needed for wheezing or shortness of breath.     alprazolam (XANAX) 2 MG tablet Take 2 mg by mouth 3 (three) times daily.     amoxicillin-clavulanate (AUGMENTIN) 875-125 MG tablet Take 1 tablet by mouth every 12 (twelve) hours. (Patient not taking: Reported on 05/07/2021) 14 tablet 0   benzonatate (TESSALON) 100  MG capsule Take 200 mg by mouth every 8 (eight) hours as needed for cough.     cetirizine (ZYRTEC) 10 MG tablet Take 1 tablet (10 mg total) by mouth daily. (Patient taking differently: Take 10 mg by mouth daily as needed for allergies.)     Clindamycin-Benzoyl Per, Refr, gel Apply 1 application topically daily.     diphenoxylate-atropine (LOMOTIL) 2.5-0.025 MG tablet Take 1 tablet by mouth every 8 (eight) hours as needed for  diarrhea or loose stools.     escitalopram (LEXAPRO) 20 MG tablet Take 1 tablet by mouth daily.     famotidine (PEPCID) 40 MG tablet Take 40 mg by mouth daily.     folic acid (FOLVITE) 1 MG tablet Take 1 mg by mouth daily.     furosemide (LASIX) 40 MG tablet Take 40 mg by mouth daily as needed for fluid.     haloperidol (HALDOL) 5 MG tablet Take 5 mg by mouth daily.      hydrOXYzine (ATARAX/VISTARIL) 50 MG tablet Take 1 tablet (50 mg total) by mouth every 6 (six) hours as needed for anxiety. 30 tablet 0   lurasidone 120 MG TABS Take 1 tablet (120 mg total) by mouth daily. 30 tablet 0   meclizine (ANTIVERT) 25 MG tablet Take 25 mg by mouth every 8 (eight) hours as needed for dizziness.      ondansetron (ZOFRAN) 8 MG tablet Take 8 mg by mouth every 8 (eight) hours as needed for nausea/vomiting, nausea or vomiting.     pantoprazole (PROTONIX) 40 MG tablet Take 1 tablet (40 mg total) by mouth 2 (two) times daily. 180 tablet 1   thiamine (VITAMIN B-1) 100 MG tablet Take 100 mg by mouth daily.      topiramate (TOPAMAX) 100 MG tablet TAKE 1 TABLET (100 MG TOTAL) BY MOUTH AT BEDTIME. 90 tablet 1   traZODone (DESYREL) 300 MG tablet Take 1 tablet (300 mg total) by mouth at bedtime as needed for sleep. (Patient taking differently: Take 1,200 mg by mouth at bedtime.) 30 tablet 0   valACYclovir (VALTREX) 1000 MG tablet Take 1 tablet (1,000 mg total) by mouth daily.     zolpidem (AMBIEN) 10 MG tablet Take 10 mg by mouth at bedtime.      Blood pressure 101/64, pulse (!) 112, temperature 98.9 F (37.2 C), temperature source Oral, resp. rate 20, height 6' 3"  (1.905 m), weight 109.1 kg, SpO2 99 %. Physical Exam:  General: lethargic, WD, chronically ill appearing male HEENT: head is normocephalic, atraumatic.  Sclera are noninjected. Mouth is pink and moist Heart: regular, rate, and rhythm.  Normal s1,s2. No obvious murmurs, gallops, or rubs noted.   Lungs: CTAB, no wheezes, rhonchi, or rales noted.   Respiratory effort nonlabored Abd: soft, NT, ND MS: sinus appearing tracts to bilateral buttocks with erythema and purulent appearing drainage, +1 edema to BLE Skin: warm and dry with no masses, lesions, or rashes Psych: unable to accurately assess   Results for orders placed or performed during the hospital encounter of 05/07/21 (from the past 48 hour(s))  Comprehensive metabolic panel     Status: Abnormal   Collection Time: 05/09/21  5:44 AM  Result Value Ref Range   Sodium 134 (L) 135 - 145 mmol/L   Potassium 3.4 (L) 3.5 - 5.1 mmol/L   Chloride 104 98 - 111 mmol/L   CO2 24 22 - 32 mmol/L   Glucose, Bld 102 (H) 70 - 99 mg/dL    Comment: Glucose reference  range applies only to samples taken after fasting for at least 8 hours.   BUN 11 6 - 20 mg/dL   Creatinine, Ser 1.57 (H) 0.61 - 1.24 mg/dL   Calcium 8.0 (L) 8.9 - 10.3 mg/dL   Total Protein 6.8 6.5 - 8.1 g/dL   Albumin 1.7 (L) 3.5 - 5.0 g/dL   AST 45 (H) 15 - 41 U/L   ALT 33 0 - 44 U/L   Alkaline Phosphatase 190 (H) 38 - 126 U/L   Total Bilirubin 0.6 0.3 - 1.2 mg/dL   GFR, Estimated 58 (L) >60 mL/min    Comment: (NOTE) Calculated using the CKD-EPI Creatinine Equation (2021)    Anion gap 6 5 - 15    Comment: Performed at [facility], [place] [address] [place], [place] [address]  CBC     Status: Abnormal   Collection Time: 05/09/21  5:44 AM  Result Value Ref Range   WBC 17.7 (H) 4.0 - 10.5 K/uL   RBC 1.93 (L) 4.22 - 5.81 MIL/uL   Hemoglobin 6.3 (LL) 13.0 - 17.0 g/dL    Comment: This critical result has verified and been called to MONICA DAVIS RN by Marvell Fuller on 07 31 2022 at [place], and has been read back. CRITICAL RESULT VERIFIED   HCT 20.9 (L) 39.0 - 52.0 %   MCV 108.3 (H) 80.0 - 100.0 fL   MCH 32.6 26.0 - 34.0 pg   MCHC 30.1 30.0 - 36.0 g/dL   RDW 15.1 11.5 - 15.5 %   Platelets 126 (L) 150 - 400 K/uL    Comment: Immature Platelet Fraction may be clinically indicated, consider ordering this  additional test [id number]    nRBC 0.0 0.0 - 0.2 %    Comment: Performed at [facility], [place] [address] [place], [place] [address]  Type and screen [place]     Status: None (Preliminary result)   Collection Time: 05/09/21  8:03 AM  Result Value Ref Range   ABO/RH(D) O POS    Antibody Screen NEG    Sample Expiration 05/12/2021,2359    Unit Number [id number]    Blood Component Type RED CELLS,LR    Unit division 00    Status of Unit ISSUED,FINAL    Transfusion Status OK TO TRANSFUSE    Crossmatch Result      Compatible Performed at [facility], [place] [address], [place], [place] [address]    Unit Number [id number]    Blood Component Type RED CELLS,LR    Unit division 00    Status of Unit ISSUED    Transfusion Status OK TO TRANSFUSE    Crossmatch Result Compatible   Prepare RBC (crossmatch)     Status: None   Collection Time: 05/09/21  8:03 AM  Result Value Ref Range   Order Confirmation      ORDER PROCESSED BY BLOOD BANK Performed at [facility], [place] [address] [place], [place] [address]   Comprehensive metabolic panel     Status: Abnormal   Collection Time: 05/10/21  4:16 AM  Result Value Ref Range   Sodium 134 (L) 135 - 145 mmol/L   Potassium 3.2 (L) 3.5 - 5.1 mmol/L   Chloride 103 98 - 111 mmol/L   CO2 22 22 - 32 mmol/L   Glucose, Bld 102 (H) 70 - 99 mg/dL    Comment: Glucose reference range applies only to samples taken after fasting for at least 8 hours.  BUN 12 6 - 20 mg/dL   Creatinine, Ser 1.71 (H) 0.61 - 1.24 mg/dL   Calcium 8.0 (L) 8.9 - 10.3 mg/dL   Total Protein 6.6 6.5 - 8.1 g/dL   Albumin 1.6 (L) 3.5 - 5.0 g/dL   AST 42 (H) 15 - 41 U/L   ALT 31 0 - 44 U/L   Alkaline Phosphatase 163 (H) 38 - 126 U/L   Total Bilirubin 1.1 0.3 - 1.2 mg/dL   GFR, Estimated 52 (L) >60 mL/min    Comment: (NOTE) Calculated using the CKD-EPI Creatinine Equation (2021)    Anion gap 9 5 - 15     Comment: Performed at [facility], [place] [address] [place], [place] [address]  CBC     Status: Abnormal   Collection Time: 05/10/21  4:16 AM  Result Value Ref Range   WBC 19.8 (H) 4.0 - 10.5 K/uL   RBC 1.92 (L) 4.22 - 5.81 MIL/uL   Hemoglobin 6.2 (LL) 13.0 - 17.0 g/dL    Comment: CRITICAL VALUE NOTED.  VALUE IS CONSISTENT WITH PREVIOUSLY REPORTED AND CALLED VALUE.   HCT 19.9 (L) 39.0 - 52.0 %   MCV 103.6 (H) 80.0 - 100.0 fL   MCH 32.3 26.0 - 34.0 pg   MCHC 31.2 30.0 - 36.0 g/dL   RDW 18.9 (H) 11.5 - 15.5 %   Platelets 128 (L) 150 - 400 K/uL    Comment: Immature Platelet Fraction may be clinically indicated, consider ordering this additional test [id number]    nRBC 0.0 0.0 - 0.2 %    Comment: Performed at [facility] [place] [address], [place], [place] [address]  Prepare RBC (crossmatch)     Status: None   Collection Time: 05/10/21  5:27 AM  Result Value Ref Range   Order Confirmation      ORDER PROCESSED BY BLOOD BANK Performed at [place] [address] [place], [place] [address]   Vancomycin, trough     Status: Abnormal   Collection Time: 05/10/21 10:39 AM  Result Value Ref Range   Vancomycin Tr 43 (HH) 15 - 20 ug/mL    Comment: CRITICAL RESULT CALLED TO, READ BACK BY AND VERIFIED WITH: CHRISTINA, RN @ 1120 ON 05/10/2021 BY BROOKS, L Performed at [facility], [place] [address] [place], [place] [address]    No results found.    Assessment/Plan Concern for BL buttock/thigh abscesses - recommend CT to better evaluate - ID consulted given HIV - WBC 19 - on BS abx - will make NPO after MN in case patient requires surgical intervention - although if ortho planning to take to OR for chronic avascular necrosis of left hip they could potentially I&D as well  FEN: reg diet, IVF per [facility] VTE: SCDs ID: [email or web address]   Acute metabolic encephalopathy Acute on chronic anemia with  thrombocytopenia Transaminitis Alcohol abuse with possible withdrawal and possible Wernicke's encephalopathy HIV disease, non-compliant with meds Chronic avascular necrosis of left hip Schizoaffective disorder GERD Hx of seizures    Norm Parcel, [facility] 05/10/2021, 3:27 PM Please see Amion for pager number during day hours 7:00am-4:30pm

## 2021-05-10 NOTE — Consult Note (Signed)
WOC Nurse Consult Note: Patient receiving care in [place]. Primary RN, Nelia Shi, and I assessed the patient. Reason for Consult: BLE wounds Wound type: presumed by me to be infectious Pressure Injury POA: Yes/No/NA The areas of the bilateral posterior thighs do not represent pressure injuries.  Frankly, I am not sure what is going on.  The patient is known HIV+ and I suspect the areas are associated with this underlying condition.  There are many openings on the bilateral posterior thighs, many of which are draining heavy amounts of bloody purulent fluid.  There is erythema and induration, and the patient tells me the areas are painful.  I question if he has abscess formation under some of these, particularly on the left thigh.  I have communicated my concerns to Dr. Tana Coast via [place], including if Infectious Disease and Surgical specialists might be able to contribute to treatment of the areas, as a topical dressing will not remedy this condition.  Dr. Tana Coast has acknowledged my communication. I have ordered the following: Wash bilateral posterior thighs with soap and water, dry. Place multiple ABD pads over all draining areas, secure with tape. Change twice daily and with soilage. Also, twice daily application of Gerhardt's butt cream to the reddened areas of the buttocks. Thank you for the consult.  Discussed plan of care with the patient and bedside nurse.  [place] nurse will not follow at this time.  Please re-consult the [place] team if needed.  Val Riles, RN, MSN, CWOCN, CNS-BC, pager [phone]

## 2021-05-10 NOTE — Progress Notes (Signed)
[facility]                                                                              Patient Demographics  [name], is a 35 y.o. male, DOB - May 16, 1986, [id number]  Admit date - 05/07/2021   Admitting Physician Vianne Grieshop [name], MD  Outpatient Primary MD for the patient is [name], MD  Outpatient specialists:   LOS - 3  days   Medical records reviewed and are as summarized below:    Chief Complaint  Patient presents with   Leg Swelling       Brief summary   Patient is a 35 year old male with history of HIV, PTSD, schizoaffective disorder, seizures, heavy alcohol abuse, hypertension was brought to ED via EMS for altered mental status.  I was unable to obtain any history from the patient due to his mental status (whispering and rambling).  He was able to tell me his name.  Per EMS and EDP records, patient lives alone.  Patient's family called EMS because he was not taking his medications.  He was laying on the couch for the last 2 weeks, covered in feces and urine.  Patient was surrounded by many alcohol bottles.  He was also noted to have increased lower extremity swelling, sores and redness to the feet.   Assessment & Plan   Principal Problem:   Acute metabolic encephalopathy: Unclear etiology, possibly Warnicke's encephalopathy/Korsakoff syndrome or hepatic encephalopathy or seizure or due to acute infection/cellulitis, uremia -CT head showed no acute intracranial abnormality, increase fluid in the right ethmoid air cells. -Ammonia level 30, TSH 1.1, B1 pending. -Mental status is improving, appears to be close to his baseline. -Completed high-dose thiamine 500 mg 3 times daily for 3 days -Continue IV vancomycin and Rocephin per pharmacy -Continue dysphagia diet per SLP evaluation  Active Problems:     Cellulitis, lower extremity edema with wounds, sacral/gluteal/thigh wounds -Lower extremity edema likely due to severe hypoalbuminemia  and third spacing.  Improving. -2D echo showed EF of 60 to 65%, G1 DD -Venous Dopplers negative for DVT -Several wounds on the lower extremities, thigh and gluteal/sacral area, with purulent discharge and bleeding -Wound care was consulted, however out of scope. I have requested ID and surgery consult.   Acute on chronic anemia, macrocytic anemia, thrombocytopenia -FOBT negative on 7/29 -Anemia panel showed anemia of chronic disease -Bleeding from his wounds on the back, transfused 1 unit packed RBC on 7/31, hemoglobin still 6.2, will transfuse another unit today  Transaminitis with elevated alk phos, AST, thrombocytopenia: Likely due to alcohol abuse -Ammonia level 30 -abdominal ultrasound did not show any cholelithiasis or biliary duct dilatation, showed hepatic steatosis. -LFTs improving   Acute kidney injury -AKI possibly due to dehydration, poor oral intake, intravascular depletion -Creatinine 1.3 at the time of admission, baseline 1.1 on 12/03/2020 -Creatinine likely worsening due to anemia, 1.7.  - Will give gentle hydration and renal dose vancomycin  Hypokalemia -Replaced   HIV disease -Patient has been noncompliant with his antiretrovirals, check viral load -resume Biktarvy, valacyclovir.  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   -Left hip x-ray showed chronic and  absent fracture of the left femoral head similar to prior exam -Currently scheduled for surgery on 05/18/2021 outpatient (Dr. Alvan Dame) for total hip arthroplasty.  Patient asking if the surgery can be done during this hospitalization.  Requested orthopedics consult.    Obesity Estimated body mass index is 30.06 kg/m as calculated from the following:   Height as of this encounter: 6' 3"  (1.905 m).   Weight as of this encounter: 109.1 kg.  Code Status: Full CODE STATUS DVT Prophylaxis:  heparin injection 5,000 Units Start: 05/07/21 2200   Level of Care: Level of care: Progressive Family Communication: Discussed all imaging results, lab results, explained to the patient    Disposition Plan:     Status is: Inpatient  Remains inpatient appropriate because:Inpatient level of care appropriate due to severity of illness  Dispo: The patient is from: Home              Anticipated d/c is to:  TBD              Patient currently is not medically stable to d/c.   Difficult to place patient No      Time Spent in minutes 25 minutes  Procedures:  CT head, abdominal ultrasound  Consultants:   Orthopedics General surgery Infectious disease   Antimicrobials:   Anti-infectives (From admission, onward)    Start     Dose/Rate Route Frequency Ordered Stop   05/10/21 1022  vancomycin variable dose per unstable renal function (pharmacist dosing)         Does not apply See admin instructions 05/10/21 1022     05/08/21 2200  vancomycin (VANCOREADY) IVPB 1250 mg/250 mL  Status:  Discontinued        1,250 mg 166.7 mL/hr over 90 Minutes Intravenous Every 12 hours 05/08/21 1044 05/10/21 1021   05/08/21 1100  vancomycin (VANCOREADY) IVPB 2000 mg/400 mL        2,000 mg 200 mL/hr over 120 Minutes Intravenous  Once 05/08/21 1044 05/08/21 1523   05/07/21 1915  cefTRIAXone (ROCEPHIN) 2 g in sodium chloride 0.9 % 100 mL IVPB        2 g 200 mL/hr over 30 Minutes Intravenous Every 24 hours 05/07/21 1912  05/14/21 1759   05/07/21 1915  bictegravir-emtricitabine-tenofovir AF (BIKTARVY) 50-200-25 MG per tablet 1 tablet        1 tablet Oral Daily 05/07/21 1912     05/07/21 1915  valACYclovir (VALTREX) tablet 1,000 mg        1,000 mg Oral Daily 05/07/21 1912            Medications  Scheduled Meds:  ALPRAZolam  2 mg Oral TID   bictegravir-emtricitabine-tenofovir AF  1 tablet Oral Daily   escitalopram  20 mg Oral Daily   famotidine  40 mg Oral Daily   folic acid  1 mg Oral Daily   Gerhardt's butt cream   Topical BID  guaiFENesin  1,200 mg Oral BID   heparin  5,000 Units Subcutaneous Q8H   lurasidone  120 mg Oral q1800   multivitamin with minerals  1 tablet Oral Daily   nicotine  21 mg Transdermal Daily   pantoprazole  40 mg Oral BID   valACYclovir  1,000 mg Oral Daily   vancomycin variable dose per unstable renal function (pharmacist dosing)   Does not apply See admin instructions   Continuous Infusions:  cefTRIAXone (ROCEPHIN)  IV 2 g (05/09/21 1855)   PRN Meds:.acetaminophen **OR** acetaminophen, albuterol, benzonatate, trazodone      Subjective:   [name] was seen and examined today.  Alert and oriented however hemoglobin worsening.  Wounds on the posterior left thigh, gluteal region, sacral region bleeding with purulent discharge.  Cough is improving.  No acute shortness of breath, nausea vomiting or diarrhea.  Objective:   Vitals:   05/10/21 0640 05/10/21 0916 05/10/21 1025 05/10/21 1251  BP: (!) 94/45 107/60 110/70 101/64  Pulse: (!) 102 98 96 (!) 112  Resp: 18 16  20   Temp: 98.9 F (37.2 C) 98.9 F (37.2 C) 98.8 F (37.1 C) 98.9 F (37.2 C)  TempSrc: Oral Oral Oral Oral  SpO2: 99% 100% 100% 99%  Weight:      Height:        Intake/Output Summary (Last 24 hours) at 05/10/2021 1501 Last data filed at 05/10/2021 1252 Gross per 24 hour  Intake 1897.33 ml  Output 1825 ml  Net 72.33 ml     Wt Readings from Last 3 Encounters:  05/08/21 109.1 kg   02/26/21 113.4 kg  12/03/20 112.9 kg   Physical Exam General: Alert and oriented x 3, NAD Cardiovascular: S1 S2 clear, RRR.  Respiratory: CTAB, no wheezing, rales or rhonchi Gastrointestinal: Soft, nontender, nondistended, NBS Ext: 1+ pedal edema bilaterally, rt> Lt Neuro: no new deficits Skin: Wounds on the bilateral lower extremity, posterior thighs gluteal and sacral region Psych: Normal affect and demeanor, alert and oriented x3    Data Reviewed:  I have personally reviewed following labs and imaging studies  Micro Results Recent Results (from the past 240 hour(s))  Resp Panel by RT-PCR (Flu A&B, Covid) Nasopharyngeal Swab     Status: None   Collection Time: 05/07/21  2:48 PM   Specimen: Nasopharyngeal Swab; Nasopharyngeal(NP) swabs in vial transport medium  Result Value Ref Range Status   SARS Coronavirus 2 by RT PCR NEGATIVE NEGATIVE Final    Comment: (NOTE) SARS-CoV-2 target nucleic acids are NOT DETECTED.  The SARS-CoV-2 RNA is generally detectable in upper respiratory specimens during the acute phase of infection. The lowest concentration of SARS-CoV-2 viral copies this assay can detect is 138 copies/mL. A negative result does not preclude SARS-Cov-2 infection and should not be used as the sole basis for treatment or other patient management decisions. A negative result may occur with  improper specimen collection/handling, submission of specimen other than nasopharyngeal swab, presence of viral mutation(s) within the areas targeted by this assay, and inadequate number of viral copies(<138 copies/mL). A negative result must be combined with clinical observations, patient history, and epidemiological information. The expected result is Negative.  Fact Sheet for Patients:  EntrepreneurPulse.com.au  Fact Sheet for Healthcare Providers:  IncredibleEmployment.be  This test is no t yet approved or cleared by the [place] FDA  and  has been authorized for detection and/or diagnosis of SARS-CoV-2 by FDA under an Emergency Use Authorization (EUA). This EUA will remain  in effect (meaning  this test can be used) for the duration of the COVID-19 declaration under Section 564(b)(1) of the Act, 21 U.S.C.section 360bbb-3(b)(1), unless the authorization is terminated  or revoked sooner.       Influenza A by PCR NEGATIVE NEGATIVE Final   Influenza B by PCR NEGATIVE NEGATIVE Final    Comment: (NOTE) The Xpert Xpress SARS-CoV-2/FLU/RSV plus assay is intended as an aid in the diagnosis of influenza from Nasopharyngeal swab specimens and should not be used as a sole basis for treatment. Nasal washings and aspirates are unacceptable for Xpert Xpress SARS-CoV-2/FLU/RSV testing.  Fact Sheet for Patients: EntrepreneurPulse.com.au  Fact Sheet for Healthcare Providers: IncredibleEmployment.be  This test is not yet approved or cleared by the [place] FDA and has been authorized for detection and/or diagnosis of SARS-CoV-2 by FDA under an Emergency Use Authorization (EUA). This EUA will remain in effect (meaning this test can be used) for the duration of the COVID-19 declaration under Section 564(b)(1) of the Act, 21 U.S.C. section 360bbb-3(b)(1), unless the authorization is terminated or revoked.  Performed at [facility], [place] [address], [place], [place] [address]   Urine Culture     Status: Abnormal   Collection Time: 05/07/21  5:02 PM   Specimen: Urine, Clean Catch  Result Value Ref Range Status   Specimen Description   Final    URINE, CLEAN CATCH Performed at [facility], [place] [address], [place], [place] [address]    Special Requests   Final    NONE Performed at [facility], [place] [address] [place], [place] [address]    Culture MULTIPLE SPECIES PRESENT, SUGGEST RECOLLECTION (A)  Final   Report Status  05/09/2021 FINAL  Final  Culture, blood (Routine X 2) w Reflex to ID Panel     Status: None (Preliminary result)   Collection Time: 05/07/21  5:03 PM   Specimen: BLOOD  Result Value Ref Range Status   Specimen Description   Final    BLOOD LEFT ANTECUBITAL Performed at [place] [address] [place], [place] [address]    Special Requests   Final    BOTTLES DRAWN AEROBIC AND ANAEROBIC Blood Culture adequate volume Performed at [place] [address], [place], [place] [address]    Culture   Final    NO GROWTH 2 DAYS Performed at [place] [address], [place], [place] [address]    Report Status PENDING  Incomplete  Culture, blood (Routine X 2) w Reflex to ID Panel     Status: None (Preliminary result)   Collection Time: 05/07/21  5:08 PM   Specimen: BLOOD  Result Value Ref Range Status   Specimen Description   Final    BLOOD BLOOD LEFT FOREARM Performed at [place] [address], [place], [place] [address]    Special Requests   Final    BOTTLES DRAWN AEROBIC ONLY Blood Culture results may not be optimal due to an inadequate volume of blood received in culture bottles Performed at [place] [address], [place], [place] [address]    Culture   Final    NO GROWTH 2 DAYS Performed at [place] [address], [place],  [address]    Report Status PENDING  Incomplete    Radiology Reports CT Head Wo Contrast  Result Date: 05/07/2021 CLINICAL DATA:  Mental status change, unknown cause. EXAM: CT HEAD WITHOUT CONTRAST TECHNIQUE: Contiguous  axial images were obtained from the base of the skull through the vertex without intravenous contrast. COMPARISON:  03/14/2020 FINDINGS: Brain: No evidence for acute hemorrhage, mass lesion, midline shift, hydrocephalus or large infarct. Again noted are calcifications in the basal ganglia. Vascular: No hyperdense vessel or  unexpected calcification. Skull: Normal. Negative for fracture or focal lesion. Sinuses/Orbits: Increased fluid in the right mastoid air cells. Small focus of mucosal thickening near the junction of the right ethmoid air cells and right frontal sinus. Other: Superficial low-density nodules along the lateral aspect of the face and temporal regions. There were similar findings on the prior examination but these superficial structures are now more conspicuous. Index lesion on the left side on sequence 2 image 1 measures 9 mm. IMPRESSION: 1. No acute intracranial abnormality. 2. Increased fluid in the right ethmoid air cells. 3. Again noted are small superficial nodules along the lateral aspect of the face. These small superficial nodular structures may have enlarged in the interim. Recommend clinical correlation in these areas. Electronically Signed   By: Markus Daft M.D.   On: 05/07/2021 17:50   US Abdomen Complete  Result Date: 05/07/2021 CLINICAL DATA:  Transaminitis. Severe occult dependence. HIV. EXAM: ABDOMEN ULTRASOUND COMPLETE COMPARISON:  03/14/2020 FINDINGS: Gallbladder: No gallstones or wall thickening visualized. No sonographic Murphy sign noted by sonographer. Common bile duct: Diameter: 3 mm, within normal limits. Liver: Diffusely increased echogenicity of the hepatic parenchyma, consistent with hepatic steatosis. No hepatic mass identified. Portal vein is patent on color Doppler imaging with normal direction of blood flow towards the liver. IVC: No abnormality visualized. Pancreas: Visualized portion unremarkable. Spleen: Size and appearance within normal limits. Right Kidney: Length: 10.6 cm. Echogenicity within normal limits. No mass or hydronephrosis visualized. Left Kidney: Length: 11.3 cm. Echogenicity within normal limits. No mass or hydronephrosis visualized. Abdominal aorta: No aneurysm visualized. Other findings: None. IMPRESSION: Diffuse hepatic steatosis, without significant change since  prior exam. No hepatic mass or other significant abnormality identified. No evidence of gallstones or biliary ductal dilatation. Electronically Signed   By: Marlaine Hind M.D.   On: 05/07/2021 18:41   DG Chest Port 1 View  Result Date: 05/07/2021 CLINICAL DATA:  Leg swelling EXAM: PORTABLE CHEST 1 VIEW COMPARISON:  Chest x-ray dated March 15, 2020 FINDINGS: Cardiac and mediastinal contours are unchanged. Clear lungs. No pleural effusion or pneumothorax. IMPRESSION: Clear lungs. Electronically Signed   By: Yetta Glassman MD   On: 05/07/2021 16:14   DG Swallowing Func-Speech Pathology  Result Date: 05/08/2021 Formatting of this result is different from the original. Objective Swallowing Evaluation: Type of Study: Bedside Swallow Evaluation  Patient Details Name: [name] MRN: [id number] Date of Birth: 13-Aug-1986 Today's Date: 05/08/2021 Time: SLP Start Time (ACUTE ONLY): 1050 -SLP Stop Time (ACUTE ONLY): 1110 SLP Time Calculation (min) (ACUTE ONLY): 20 min Past Medical History: Past Medical History: Diagnosis Date  Bipolar 1 disorder ([place])   Depression   Dizziness and giddiness 02/01/2016  Herpes genitalia   HIV disease ([place])   Hypertension   Migraine headache 02/01/2016  Peripheral neuropathy 10/01/2019  PTSD (post-traumatic stress disorder)   Schizoaffective disorder ([place])   Seizures ([place])  Past Surgical History: Past Surgical History: Procedure Laterality Date  BACK SURGERY    BIOPSY  02/26/2021  Procedure: BIOPSY;  Surgeon: Otis Brace, MD;  Location: [facility];  Service: Gastroenterology;;  COLONOSCOPY WITH PROPOFOL [name] 02/26/2021  Procedure: COLONOSCOPY WITH PROPOFOL;  Surgeon: Otis Brace, MD;  Location: [facility];  Service: Gastroenterology;  Laterality: [name];  ESOPHAGOGASTRODUODENOSCOPY (EGD) WITH PROPOFOL [name] 02/26/2021  Procedure: ESOPHAGOGASTRODUODENOSCOPY (EGD) WITH PROPOFOL;  Surgeon: Otis Brace, MD;  Location: [facility];  Service: Gastroenterology;  Laterality: [name];   HAND SURGERY   HPI: Patient is a 35 y.o. male with PMH: PTSD, HIV, schizoaffective disorder, seizures, heavy alcohol abuse, HTN. He was brought to ED via EMS from home for AMS. Patient's family called EMS because patient was not taking his medications and has been laying on the couch for past 2 weeks covered in feces and urine, surrounded by multiple alcohol bottles. In addition, he was noted to have increased LE swelling and sores on feet.  Patient was evaluated by SLP for swallow function in January of 2021 and MBS was completed which showed aspiration of thin liquids with larger sips, but without penetration or aspiration when taking smaller more controlled sips.  Subjective: pleasant, sitting in chair in radiology suite Assessment / Plan / Recommendation CHL IP CLINICAL IMPRESSIONS 05/08/2021 Clinical Impression Patient presents with a mild oral and a moderate pharyngeal phase dysphagia which appears to be largely sensory based. When patient consumed small to regular sized sips of thin liquids via cup, delay in swallow initiation to pyriform sinus was observed, but no aspiration or penetration and full clearance of bolus. When patient took large cup or straw sips and when patient consuming thin liquid barium with barium tablet, liquids pooled into vallecular and pyriform sinuses before swallow was initiated, leading to aspiration before swallow initiation. Patient did immediately cough when aspiration occuring, but this was ineffective to clear aspirate. One instance of flash penetration (PAS 2) observed with thin liquids. No appreciable difference between nectar thick liquids and thin liquids.  He exhibited mildly prolonged mastication of graham cracker, however did have full clearance of oral cavity. SLP compared today's MBS films with previous MBS in January of 2021 and no significant difference was noted in patient's swallow function. SLP is recommending Dys 3 solids, thin liquids, no straws and meds whole in  puree. SLP Visit Diagnosis Dysphagia, oropharyngeal phase (R13.12) Attention and concentration deficit following -- Frontal lobe and executive function deficit following -- Impact on safety and function Mild aspiration risk;Moderate aspiration risk   CHL IP TREATMENT RECOMMENDATION 05/08/2021 Treatment Recommendations Therapy as outlined in treatment plan below   Prognosis 05/08/2021 Prognosis for Safe Diet Advancement Fair Barriers to Reach Goals Time post onset Barriers/Prognosis Comment dysphagia appears to be chronic CHL IP DIET RECOMMENDATION 05/08/2021 SLP Diet Recommendations Dysphagia 3 (Mech soft) solids;Thin liquid Liquid Administration via Cup;No straw Medication Administration Whole meds with puree Compensations Slow rate;Small sips/bites;Minimize environmental distractions Postural Changes Seated upright at 90 degrees   CHL IP OTHER RECOMMENDATIONS 05/08/2021 Recommended Consults -- Oral Care Recommendations Oral care BID Other Recommendations --   CHL IP FOLLOW UP RECOMMENDATIONS 05/08/2021 Follow up Recommendations None   CHL IP FREQUENCY AND DURATION 05/08/2021 Speech Therapy Frequency (ACUTE ONLY) min 1 x/week Treatment Duration 1 week      CHL IP ORAL PHASE 05/08/2021 Oral Phase Impaired Oral - Pudding Teaspoon -- Oral - Pudding Cup -- Oral - Honey Teaspoon -- Oral - Honey Cup -- Oral - Nectar Teaspoon -- Oral - Nectar Cup -- Oral - Nectar Straw -- Oral - Thin Teaspoon -- Oral - Thin Cup -- Oral - Thin Straw -- Oral - Puree -- Oral - Mech Soft Impaired mastication;Delayed oral transit Oral - Regular -- Oral - Multi-Consistency -- Oral - Pill -- Oral Phase - Comment --  CHL IP  PHARYNGEAL PHASE 05/08/2021 Pharyngeal Phase Impaired Pharyngeal- Pudding Teaspoon -- Pharyngeal -- Pharyngeal- Pudding Cup -- Pharyngeal -- Pharyngeal- Honey Teaspoon -- Pharyngeal -- Pharyngeal- Honey Cup -- Pharyngeal -- Pharyngeal- Nectar Teaspoon -- Pharyngeal -- Pharyngeal- Nectar Cup Delayed swallow initiation-pyriform sinuses  Pharyngeal -- Pharyngeal- Nectar Straw Delayed swallow initiation-pyriform sinuses Pharyngeal -- Pharyngeal- Thin Teaspoon -- Pharyngeal -- Pharyngeal- Thin Cup Delayed swallow initiation-pyriform sinuses;Reduced airway/laryngeal closure;Penetration/Aspiration before swallow Pharyngeal Material enters airway, passes BELOW cords and not ejected out despite cough attempt by patient;Material enters airway, remains ABOVE vocal cords then ejected out Pharyngeal- Thin Straw Delayed swallow initiation-pyriform sinuses;Reduced airway/laryngeal closure;Penetration/Aspiration before swallow Pharyngeal Material enters airway, passes BELOW cords and not ejected out despite cough attempt by patient Pharyngeal- Puree Delayed swallow initiation-vallecula Pharyngeal -- Pharyngeal- Mechanical Soft Delayed swallow initiation-vallecula Pharyngeal -- Pharyngeal- Regular -- Pharyngeal -- Pharyngeal- Multi-consistency -- Pharyngeal -- Pharyngeal- Pill Reduced pharyngeal peristalsis Pharyngeal -- Pharyngeal Comment --  CHL IP CERVICAL ESOPHAGEAL PHASE 05/08/2021 Cervical Esophageal Phase Impaired Pudding Teaspoon -- Pudding Cup -- Honey Teaspoon -- Honey Cup -- Nectar Teaspoon -- Nectar Cup -- Nectar Straw -- Thin Teaspoon -- Thin Cup -- Thin Straw -- Puree -- Mechanical Soft -- Regular -- Multi-consistency -- Pill Prominent cricopharyngeal segment Cervical Esophageal Comment -- Sonia Baller, MA, CCC-SLP Speech Therapy             ECHOCARDIOGRAM COMPLETE  Result Date: 05/08/2021    ECHOCARDIOGRAM REPORT   Patient Name:   [name] Date of Exam: 05/08/2021 Medical Rec #:  [id number]        Height:       75.0 in Accession #:    [id number]       Weight:       250.0 lb Date of Birth:  04/30/1986        BSA:          2.413 m Patient Age:    35 years         BP:           104/89 mmHg Patient Gender: M                HR:           95 bpm. Exam Location:  Inpatient Procedure: 2D Echo, Color Doppler and Cardiac Doppler Indications:     I50.9* Heart failure (unspecified)  History:        Patient has prior history of Echocardiogram examinations, most                 recent 11/01/2019. Risk Factors:ETOH Abuse, HIV, COVID+ 10/2019                 and Hypertension.  Sonographer:    Raquel Sarna Senior RDCS Referring Phys: [id number] Marycruz Boehner K Zackarie Chason IMPRESSIONS  1. Left ventricular ejection fraction, by estimation, is 60 to 65%. The left ventricle has normal function. The left ventricle has no regional wall motion abnormalities. Left ventricular diastolic parameters are consistent with Grade I diastolic dysfunction (impaired relaxation).  2. Right ventricular systolic function is normal. The right ventricular size is normal.  3. The mitral valve is normal in structure. No evidence of mitral valve regurgitation. No evidence of mitral stenosis.  4. The aortic valve is tricuspid. Aortic valve regurgitation is not visualized. No aortic stenosis is present. FINDINGS  Left Ventricle: Left ventricular ejection fraction, by estimation, is 60 to 65%. The left ventricle has normal function. The left ventricle has no regional wall motion  abnormalities. The left ventricular internal cavity size was normal in size. There is  no left ventricular hypertrophy. Left ventricular diastolic parameters are consistent with Grade I diastolic dysfunction (impaired relaxation). Indeterminate filling pressures. Right Ventricle: The right ventricular size is normal. No increase in right ventricular wall thickness. Right ventricular systolic function is normal. Left Atrium: Left atrial size was normal in size. Right Atrium: Right atrial size was normal in size. Pericardium: There is no evidence of pericardial effusion. Mitral Valve: The mitral valve is normal in structure. No evidence of mitral valve regurgitation. No evidence of mitral valve stenosis. Tricuspid Valve: The tricuspid valve is normal in structure. Tricuspid valve regurgitation is not demonstrated. No evidence of tricuspid  stenosis. Aortic Valve: The aortic valve is tricuspid. Aortic valve regurgitation is not visualized. No aortic stenosis is present. Pulmonic Valve: The pulmonic valve was normal in structure. Pulmonic valve regurgitation is not visualized. No evidence of pulmonic stenosis. Aorta: The aortic root is normal in size and structure. Venous: The inferior vena cava was not well visualized. IAS/Shunts: No atrial level shunt detected by color flow Doppler.  LEFT VENTRICLE PLAX 2D LVIDd:         3.60 cm  Diastology LVIDs:         2.30 cm  LV e' medial:    7.07 cm/s LV PW:         1.00 cm  LV E/e' medial:  10.7 LV IVS:        0.90 cm  LV e' lateral:   7.72 cm/s LVOT diam:     2.20 cm  LV E/e' lateral: 9.8 LV SV:         82 LV SV Index:   34 LVOT Area:     3.80 cm  RIGHT VENTRICLE RV S prime:     16.80 cm/s TAPSE (M-mode): 1.7 cm LEFT ATRIUM             Index       RIGHT ATRIUM           Index LA diam:        3.50 cm 1.45 cm/m  RA Area:     11.50 cm LA Vol (A2C):   43.9 ml 18.20 ml/m RA Volume:   24.90 ml  10.32 ml/m LA Vol (A4C):   46.6 ml 19.31 ml/m LA Biplane Vol: 49.0 ml 20.31 ml/m  AORTIC VALVE LVOT Vmax:   108.00 cm/s LVOT Vmean:  83.200 cm/s LVOT VTI:    0.216 m  AORTA Ao Root diam: 3.30 cm Ao Asc diam:  3.00 cm MITRAL VALVE               TRICUSPID VALVE MV Area (PHT): 3.99 cm    TR Peak grad:   11.7 mmHg MV Decel Time: 190 msec    TR Vmax:        171.00 cm/s MV E velocity: 75.90 cm/s MV A velocity: 93.60 cm/s  SHUNTS MV E/A ratio:  0.81        Systemic VTI:  0.22 m                            Systemic Diam: 2.20 cm Skeet Latch MD Electronically signed by Skeet Latch MD Signature Date/Time: 05/08/2021/3:58:48 PM    Final    DG HIP UNILAT WITH PELVIS 2-3 VIEWS LEFT  Result Date: 05/08/2021 CLINICAL DATA:  Brought in by EMS due to health concerns EXAM: DG  HIP (WITH OR WITHOUT PELVIS) 2-3V LEFT COMPARISON:  CT abdomen pelvis 09/10/2020 FINDINGS: Unchanged chronic collapse and fracture of the left femoral  head most consistent with osteonecrosis. No new fracture or dislocation. IMPRESSION: Chronic collapsed and fractured left femoral head similar to prior exam. Electronically Signed   By: Miachel Roux M.D.   On: 05/08/2021 10:37   VAS [place] LOWER EXTREMITY VENOUS (DVT)  Result Date: 05/08/2021  Lower Venous DVT Study Patient Name:  [name]  Date of Exam:   05/08/2021 Medical Rec #: [id number]         Accession #:    [id number] Date of Birth: 09-06-1986         Patient Gender: M Patient Age:   35Y Exam Location:  [facility] Procedure:      VAS [place] LOWER EXTREMITY VENOUS (DVT) Referring Phys: [id number] Hondo Nanda K Burnett Spray --------------------------------------------------------------------------------  Indications: Edema.  Limitations: Poor ultrasound/tissue interface and body habitus. Comparison Study: No prior study Performing Technologist: Maudry Mayhew MHA, RDMS, RVT, RDCS  Examination Guidelines: A complete evaluation includes B-mode imaging, spectral Doppler, color Doppler, and power Doppler as needed of all accessible portions of each vessel. Bilateral testing is considered an integral part of a complete examination. Limited examinations for reoccurring indications may be performed as noted. The reflux portion of the exam is performed with the patient in reverse Trendelenburg.  +---------+---------------+---------+-----------+----------+--------------+ RIGHT    CompressibilityPhasicitySpontaneityPropertiesThrombus Aging +---------+---------------+---------+-----------+----------+--------------+ CFV      Full           Yes      Yes                                 +---------+---------------+---------+-----------+----------+--------------+ SFJ      Full                                                        +---------+---------------+---------+-----------+----------+--------------+ FV Prox  Full                                                         +---------+---------------+---------+-----------+----------+--------------+ FV Mid   Full                                                        +---------+---------------+---------+-----------+----------+--------------+ FV DistalFull                                                        +---------+---------------+---------+-----------+----------+--------------+ PFV      Full                                                        +---------+---------------+---------+-----------+----------+--------------+  POP      Full           Yes      Yes                                 +---------+---------------+---------+-----------+----------+--------------+ PTV      Full                    Yes                                 +---------+---------------+---------+-----------+----------+--------------+ PERO     Full                    Yes                                 +---------+---------------+---------+-----------+----------+--------------+   +---------+---------------+---------+-----------+----------+--------------+ LEFT     CompressibilityPhasicitySpontaneityPropertiesThrombus Aging +---------+---------------+---------+-----------+----------+--------------+ CFV      Full           Yes      Yes                                 +---------+---------------+---------+-----------+----------+--------------+ SFJ      Full                                                        +---------+---------------+---------+-----------+----------+--------------+ FV Prox  Full                                                        +---------+---------------+---------+-----------+----------+--------------+ FV Mid   Full                                                        +---------+---------------+---------+-----------+----------+--------------+ FV DistalFull                                                         +---------+---------------+---------+-----------+----------+--------------+ PFV      Full                                                        +---------+---------------+---------+-----------+----------+--------------+ POP      Full           Yes      Yes                                 +---------+---------------+---------+-----------+----------+--------------+  PTV                              Yes                                 +---------+---------------+---------+-----------+----------+--------------+   Left Technical Findings: Not visualized segments include limited evaluation PTV, unable to visualize peroneal veins.   Summary: RIGHT: - There is no evidence of deep vein thrombosis in the lower extremity. However, portions of this examination were limited- see technologist comments above.  - No cystic structure found in the popliteal fossa.  LEFT: - There is no evidence of deep vein thrombosis in the lower extremity. However, portions of this examination were limited- see technologist comments above.  - No cystic structure found in the popliteal fossa.  *See table(s) above for measurements and observations. Electronically signed by Deitra Mayo MD on 05/08/2021 at 6:42:23 PM.    Final     Lab Data:  CBC: Recent Labs  Lab 05/07/21 1443 05/07/21 1912 05/08/21 0401 05/09/21 0544 05/10/21 0416  WBC 18.5* 18.3* 19.1* 17.7* 19.8*  NEUTROABS 15.3*  --   --   --   --   HGB 7.6* 7.2* 8.1* 6.3* 6.2*  HCT 23.8* 22.3* 25.9* 20.9* 19.9*  MCV 106.7* 107.7* 108.8* 108.3* 103.6*  PLT 142* 122* 116* 126* 476*   Basic Metabolic Panel: Recent Labs  Lab 05/07/21 1443 05/07/21 2332 05/08/21 0401 05/09/21 0544 05/10/21 0416  NA 134* 133* 136 134* 134*  K 3.8 3.8 3.8 3.4* 3.2*  CL 103 103 105 104 103  CO2 23 22 23 24 22   GLUCOSE 98 102* 97 102* 102*  BUN 13 12 12 11 12   CREATININE 1.32* 1.32* 1.35* 1.57* 1.71*  CALCIUM 8.6* 8.4* 8.4* 8.0* 8.0*  MG 1.8 1.7  --   --   --    PHOS  --  3.3  --   --   --    GFR: Estimated Creatinine Clearance: 78.9 mL/min (A) (by C-G formula based on SCr of 1.71 mg/dL (H)). Liver Function Tests: Recent Labs  Lab 05/07/21 1443 05/07/21 2332 05/08/21 0401 05/09/21 0544 05/10/21 0416  AST 43* 52* 57* 45* 42*  ALT 28 29 30  33 31  ALKPHOS 209* 198* 195* 190* 163*  BILITOT 1.0 1.1 1.0 0.6 1.1  PROT 7.8 7.2 6.9 6.8 6.6  ALBUMIN 1.9* 1.8* 1.7* 1.7* 1.6*   Recent Labs  Lab 05/07/21 1443  LIPASE 26   Recent Labs  Lab 05/07/21 1618  AMMONIA 30   Coagulation Profile: Recent Labs  Lab 05/07/21 1443  INR 1.2   Cardiac Enzymes: No results for input(s): CKTOTAL, CKMB, CKMBINDEX, TROPONINI in the last 168 hours. BNP (last 3 results) No results for input(s): PROBNP in the last 8760 hours. HbA1C: No results for input(s): HGBA1C in the last 72 hours. CBG: No results for input(s): GLUCAP in the last 168 hours. Lipid Profile: No results for input(s): CHOL, HDL, LDLCALC, TRIG, CHOLHDL, LDLDIRECT in the last 72 hours. Thyroid Function Tests: Recent Labs    05/08/21 0910  TSH 1.197   Anemia Panel: Recent Labs    05/07/21 1704 05/07/21 1912  VITAMINB12 2,821*  --   FOLATE 7.3  --   FERRITIN 433*  --   TIBC 98*  --   IRON 69  --   RETICCTPCT  --  3.3*  Urine analysis:    Component Value Date/Time   COLORURINE YELLOW 05/07/2021 1702   APPEARANCEUR HAZY (A) 05/07/2021 1702   LABSPEC 1.005 05/07/2021 1702   PHURINE 6.0 05/07/2021 1702   GLUCOSEU NEGATIVE 05/07/2021 1702   HGBUR MODERATE (A) 05/07/2021 1702   BILIRUBINUR NEGATIVE 05/07/2021 1702   KETONESUR 5 (A) 05/07/2021 1702   PROTEINUR NEGATIVE 05/07/2021 1702   UROBILINOGEN 0.2 12/21/2014 1006   NITRITE POSITIVE (A) 05/07/2021 1702   LEUKOCYTESUR LARGE (A) 05/07/2021 1702     Nichole Keltner M.D. [facility] 05/10/2021, 3:01 PM  Available via Epic secure chat 7am-7pm After 7 pm, please refer to night coverage provider listed on  amion.

## 2021-05-10 NOTE — Progress Notes (Signed)
  Speech Language Pathology Treatment: Dysphagia  [name] Details Name: [name] MRN: [id number] DOB: December 22, 1985 Today's Date: 05/10/2021 Time: 1120-1140 SLP Time Calculation (min) (ACUTE ONLY): 20 min  Assessment / Plan / Recommendation Clinical Impression  [name] seen to address dysphagia goals and observe [name] with PO intake at meal. Prior to session, [name] had requested SLP upgrade from Dys 3 solids to regular solids. As [name] is significantly more alert and lucid today as compared to on initial bedside swallow evaluation, SLP in agreement and made diet upgrade. SLP was then able to observe [name] with regular texture solids and thin liquids with [name] feeding self and not requiring any setup assistance. [name] exhibited adequate mastication and oral phase of swallow and only one instance of delayed coughing which sounded congested. As [name] has  h/o dysphagia but demonstrates understanding of swallow precautions, will continue with regular solids, thin liquids and continue with no straws restriction. SLP plans to see [name] at least one more time prior to discharge but is not recommending SLP intervention at next venue of care.   HPI HPI: [name] is a 35 y.o. male with PMH: PTSD, HIV, schizoaffective disorder, seizures, heavy alcohol abuse, HTN. He was brought to ED via EMS from home for AMS. [name]'s family called EMS because [name] was not taking his medications and has been laying on the couch for past 2 weeks covered in feces and urine, surrounded by multiple alcohol bottles. In addition, he was noted to have increased LE swelling and sores on feet.  [name] was evaluated by SLP for swallow function in January of 2021 and MBS was completed which showed aspiration of thin liquids with larger sips, but without penetration or aspiration when taking smaller more controlled sips.       SLP Plan  Continue with current plan of care       Recommendations  Diet  recommendations: Regular;Thin liquid Liquids provided via: Cup;No straw Medication Administration: Whole meds with puree Supervision: [name] able to self feed Compensations: Slow rate;Small sips/bites;Minimize environmental distractions Postural Changes and/or Swallow Maneuvers: Seated upright 90 degrees                Follow up Recommendations: None SLP Visit Diagnosis: Dysphagia, oropharyngeal phase (R13.12) Plan: Continue with current plan of care       Sonia Baller, MA, CCC-SLP Speech Therapy

## 2021-05-11 DIAGNOSIS — R7881 Bacteremia: Secondary | ICD-10-CM

## 2021-05-11 DIAGNOSIS — K2100 Gastro-esophageal reflux disease with esophagitis, without bleeding: Secondary | ICD-10-CM | POA: Diagnosis not present

## 2021-05-11 DIAGNOSIS — L03119 Cellulitis of unspecified part of limb: Secondary | ICD-10-CM | POA: Diagnosis not present

## 2021-05-11 DIAGNOSIS — B20 Human immunodeficiency virus [HIV] disease: Secondary | ICD-10-CM | POA: Diagnosis not present

## 2021-05-11 DIAGNOSIS — R7989 Other specified abnormal findings of blood chemistry: Secondary | ICD-10-CM | POA: Diagnosis not present

## 2021-05-11 DIAGNOSIS — G9341 Metabolic encephalopathy: Secondary | ICD-10-CM | POA: Diagnosis not present

## 2021-05-11 LAB — COMPREHENSIVE METABOLIC PANEL
ALT: 30 U/L (ref 0–44)
AST: 41 U/L (ref 15–41)
Albumin: 1.4 g/dL — ABNORMAL LOW (ref 3.5–5.0)
Alkaline Phosphatase: 165 U/L — ABNORMAL HIGH (ref 38–126)
Anion gap: 5 (ref 5–15)
BUN: 12 mg/dL (ref 6–20)
CO2: 24 mmol/L (ref 22–32)
Calcium: 7.8 mg/dL — ABNORMAL LOW (ref 8.9–10.3)
Chloride: 108 mmol/L (ref 98–111)
Creatinine, Ser: 1.68 mg/dL — ABNORMAL HIGH (ref 0.61–1.24)
GFR, Estimated: 53 mL/min — ABNORMAL LOW (ref >60)
Glucose, Bld: 100 mg/dL — ABNORMAL HIGH (ref 70–99)
Potassium: 3.3 mmol/L — ABNORMAL LOW (ref 3.5–5.1)
Sodium: 137 mmol/L (ref 135–145)
Total Bilirubin: 1.1 mg/dL (ref 0.3–1.2)
Total Protein: 6.2 g/dL — ABNORMAL LOW (ref 6.5–8.1)

## 2021-05-11 LAB — BLOOD CULTURE ID PANEL (REFLEXED) - BCID2

## 2021-05-11 LAB — CBC
HCT: 20.6 % — ABNORMAL LOW (ref 39.0–52.0)
Hemoglobin: 6.5 g/dL — CL (ref 13.0–17.0)
MCH: 32.2 pg (ref 26.0–34.0)
MCHC: 31.6 g/dL (ref 30.0–36.0)
MCV: 102 fL — ABNORMAL HIGH (ref 80.0–100.0)
Platelets: 147 10*3/uL — ABNORMAL LOW (ref 150–400)
RBC: 2.02 MIL/uL — ABNORMAL LOW (ref 4.22–5.81)
RDW: 20 % — ABNORMAL HIGH (ref 11.5–15.5)
WBC: 19.3 10*3/uL — ABNORMAL HIGH (ref 4.0–10.5)
nRBC: 0 % (ref 0.0–0.2)

## 2021-05-11 LAB — PREPARE RBC (CROSSMATCH)

## 2021-05-11 LAB — T-HELPER CELLS (CD4) COUNT (NOT AT ARMC)
CD4 % Helper T Cell: 55 % (ref 33–65)
CD4 T Cell Abs: 1000 /uL (ref 400–1790)

## 2021-05-11 LAB — BRAIN NATRIURETIC PEPTIDE: B Natriuretic Peptide: 220.9 pg/mL — ABNORMAL HIGH (ref 0.0–100.0)

## 2021-05-11 LAB — HEMOGLOBIN AND HEMATOCRIT, BLOOD
HCT: 24.3 % — ABNORMAL LOW (ref 39.0–52.0)
Hemoglobin: 7.5 g/dL — ABNORMAL LOW (ref 13.0–17.0)

## 2021-05-11 LAB — VANCOMYCIN, RANDOM: Vancomycin Rm: 22

## 2021-05-11 MED ORDER — ALBUTEROL SULFATE (2.5 MG/3ML) 0.083% IN NEBU
2.5000 mg | INHALATION_SOLUTION | Freq: Two times a day (BID) | RESPIRATORY_TRACT | Status: DC
  Administered 2021-05-11 – 2021-05-12 (×2): 2.5 mg via RESPIRATORY_TRACT
  Filled 2021-05-11 (×2): qty 3

## 2021-05-11 MED ORDER — VANCOMYCIN HCL 1250 MG/250ML IV SOLN
1250.0000 mg | Freq: Once | INTRAVENOUS | Status: AC
  Administered 2021-05-11: 1250 mg via INTRAVENOUS
  Filled 2021-05-11: qty 250

## 2021-05-11 MED ORDER — FUROSEMIDE 10 MG/ML IJ SOLN
20.0000 mg | Freq: Once | INTRAMUSCULAR | Status: AC
  Administered 2021-05-11: 20 mg via INTRAVENOUS
  Filled 2021-05-11: qty 2

## 2021-05-11 MED ORDER — POTASSIUM CHLORIDE CRYS ER 20 MEQ PO TBCR
40.0000 meq | EXTENDED_RELEASE_TABLET | Freq: Once | ORAL | Status: AC
  Administered 2021-05-11: 40 meq via ORAL
  Filled 2021-05-11: qty 2

## 2021-05-11 MED ORDER — ALBUTEROL SULFATE (2.5 MG/3ML) 0.083% IN NEBU
2.5000 mg | INHALATION_SOLUTION | Freq: Three times a day (TID) | RESPIRATORY_TRACT | Status: DC
  Administered 2021-05-11: 2.5 mg via RESPIRATORY_TRACT
  Filled 2021-05-11 (×2): qty 3

## 2021-05-11 MED ORDER — SODIUM CHLORIDE 0.9% IV SOLUTION: Freq: Once | INTRAVENOUS | Status: AC

## 2021-05-11 NOTE — TOC Progression Note (Signed)
Transition of Care [facility]) - Progression Note    Patient Details  Name: [name] MRN: [id number] Date of Birth: 15-Jul-1986  Transition of Care [facility]) CM/SW Contact  Ross Ludwig, [place] Phone Number: 05/11/2021, 11:19 AM  Clinical Narrative:     CSW spoke to attending physician regarding if patient would be a good candidate for [facility].  CSW contacted [facility] and they refused to accept patient, [facility] is still reviewing patient's information.  CSW to continue to follow patient's progress throughout discharge planning.     Expected Discharge Plan and Services                                                 Social Determinants of Health (SDOH) Interventions    Readmission Risk Interventions Readmission Risk Prevention Plan 11/01/2019  Home Care Screening Complete  Medication Review (RN CM) Complete  Some recent data might be hidden

## 2021-05-11 NOTE — Progress Notes (Signed)
PHARMACY - PHYSICIAN COMMUNICATION CRITICAL VALUE ALERT - BLOOD CULTURE IDENTIFICATION (BCID)  [name] is an 35 y.o. male who presented to [facility] on 05/07/2021 with a chief complaint of c/o leg swelling. LE doppler was negative for DVT.  He's currently on vancomycin and ceftriaxone for LE cellulitis/wound.  ID team is currently seeing patient.  One of three blood culture bottles has GPC in clusters (nothing noted on BCID).  Name of physician (or Provider) Contacted: Dr. Tana Coast  Current antibiotics: vancomycin and ceftriaxone  Changes to prescribed antibiotics recommended:  - Continue current abx for now. F/u with ID's team recom.  Results for orders placed or performed during the hospital encounter of 05/07/21  Blood Culture ID Panel (Reflexed) (Collected: 05/07/2021  5:08 PM)  Result Value Ref Range   Enterococcus faecalis NOT DETECTED NOT DETECTED   Enterococcus Faecium NOT DETECTED NOT DETECTED   Listeria monocytogenes NOT DETECTED NOT DETECTED   Staphylococcus species NOT DETECTED NOT DETECTED   Staphylococcus aureus (BCID) NOT DETECTED NOT DETECTED   Staphylococcus epidermidis NOT DETECTED NOT DETECTED   Staphylococcus lugdunensis NOT DETECTED NOT DETECTED   Streptococcus species NOT DETECTED NOT DETECTED   Streptococcus agalactiae NOT DETECTED NOT DETECTED   Streptococcus pneumoniae NOT DETECTED NOT DETECTED   Streptococcus pyogenes NOT DETECTED NOT DETECTED   A.calcoaceticus-baumannii NOT DETECTED NOT DETECTED   Bacteroides fragilis NOT DETECTED NOT DETECTED   Enterobacterales NOT DETECTED NOT DETECTED   Enterobacter cloacae complex NOT DETECTED NOT DETECTED   Escherichia coli NOT DETECTED NOT DETECTED   Klebsiella aerogenes NOT DETECTED NOT DETECTED   Klebsiella oxytoca NOT DETECTED NOT DETECTED   Klebsiella pneumoniae NOT DETECTED NOT DETECTED   Proteus species NOT DETECTED NOT DETECTED   Salmonella species NOT DETECTED NOT DETECTED   Serratia marcescens NOT  DETECTED NOT DETECTED   Haemophilus influenzae NOT DETECTED NOT DETECTED   Neisseria meningitidis NOT DETECTED NOT DETECTED   Pseudomonas aeruginosa NOT DETECTED NOT DETECTED   Stenotrophomonas maltophilia NOT DETECTED NOT DETECTED   Candida albicans NOT DETECTED NOT DETECTED   Candida auris NOT DETECTED NOT DETECTED   Candida glabrata NOT DETECTED NOT DETECTED   Candida krusei NOT DETECTED NOT DETECTED   Candida parapsilosis NOT DETECTED NOT DETECTED   Candida tropicalis NOT DETECTED NOT DETECTED   Cryptococcus neoformans/gattii NOT DETECTED NOT DETECTED    Lynelle Doctor 05/11/2021  10:32 AM

## 2021-05-11 NOTE — Progress Notes (Addendum)
RN & NT gave patient bed bath. RN cleansed wounds and placed new dressings and applied butt cream.

## 2021-05-11 NOTE — Plan of Care (Signed)

## 2021-05-11 NOTE — Final Consult Note (Signed)
Consultant Final Sign-Off Note    Assessment/Final recommendations  [name] is a 35 y.o. male followed by me for cellulitis of bilateral buttocks and posterior thighs with purulent and bloody drainage. CT scans obtained 8/1 and no undrained abscess cavity is underlying. No indication for surgical I&D at this time, general surgery will sign off.    Wound care (if applicable): Recommend sitz baths and showers. Wound recommend compressive bandages with ACE wraps and ABD pads and change BID or PRN for soiling    Diet at discharge: per primary team   Activity at discharge: per primary team   Follow-up appointment:  Follow up with ID and PCP    Pending results:  Unresulted Labs (From admission, onward)     Start     Ordered   05/12/21 0500  Creatinine, serum  Daily,   R     Comments: While on vancomycin - may discontinue once vanc stopped   Question:  Specimen collection method  Answer:  Lab=Lab collect   05/10/21 1157   05/11/21 1100  Vancomycin, random  Once-Timed,   TIMED       Question:  Specimen collection method  Answer:  Lab=Lab collect   05/11/21 0715   05/11/21 0837  T-helper cells (CD4) count (not at [facility])  Once,   R        05/11/21 0837   05/11/21 0500  HIV-1 RNA quant-no reflex-bld  Tomorrow morning,   R       Question:  Specimen collection method  Answer:  Lab=Lab collect   05/10/21 1643   05/09/21 0948  Occult blood card to lab, stool  Once,   R        05/09/21 0947   05/08/21 1422  Vitamin B1  Once,   R        05/08/21 1421   05/08/21 1040  T-helper cells (CD4) count (not at [facility])  Once,   STAT        05/08/21 1040             Medication recommendations: Recommend antibiotics per ID.    Other recommendations: Patient needs protein supplements to help with healing. Cessation of smoking and alcohol use.    Thank you for allowing [place] to participate in the care of your patient!  Please consult [place] again if you have further needs for your patient.  Norm Parcel 05/11/2021 12:19 PM    Subjective   Patient more alert this AM. Discussed CT findings and that I would not recommend surgical I&D or debridement at this time.   Objective  Vital signs in last 24 hours: Temp:  [98.2 F (36.8 C)-99.8 F (37.7 C)] 98.3 F (36.8 C) (08/02 1050) Pulse Rate:  [94-112] 97 (08/02 1050) Resp:  [14-20] 14 (08/02 1050) BP: (93-119)/(44-69) 119/62 (08/02 1050) SpO2:  [95 %-100 %] 100 % (08/02 1105)  General: WD, chronically ill appearing male HEENT: head is normocephalic, atraumatic.  Sclera are noninjected. Mouth is pink and moist Lungs: Respiratory effort nonlabored Psych: unable to accurately assess   Pertinent labs and Studies: Recent Labs    05/09/21 0544 05/10/21 0416 05/11/21 0429  WBC 17.7* 19.8* 19.3*  HGB 6.3* 6.2* 6.5*  HCT 20.9* 19.9* 20.6*   BMET Recent Labs    05/10/21 0416 05/11/21 0429  NA 134* 137  K 3.2* 3.3*  CL 103 108  CO2 22 24  GLUCOSE 102* 100*  BUN 12 12  CREATININE 1.71* 1.68*  CALCIUM  8.0* 7.8*   No results for input(s): LABURIN in the last 72 hours. Results for orders placed or performed during the hospital encounter of 05/07/21  Resp Panel by RT-PCR (Flu A&B, Covid) Nasopharyngeal Swab     Status: None   Collection Time: 05/07/21  2:48 PM   Specimen: Nasopharyngeal Swab; Nasopharyngeal(NP) swabs in vial transport medium  Result Value Ref Range Status   SARS Coronavirus 2 by RT PCR NEGATIVE NEGATIVE Final    Comment: (NOTE) SARS-CoV-2 target nucleic acids are NOT DETECTED.  The SARS-CoV-2 RNA is generally detectable in upper respiratory specimens during the acute phase of infection. The lowest concentration of SARS-CoV-2 viral copies this assay can detect is 138 copies/mL. A negative result does not preclude SARS-Cov-2 infection and should not be used as the sole basis for treatment or other patient management decisions. A negative result may occur with  improper specimen collection/handling,  submission of specimen other than nasopharyngeal swab, presence of viral mutation(s) within the areas targeted by this assay, and inadequate number of viral copies(<138 copies/mL). A negative result must be combined with clinical observations, patient history, and epidemiological information. The expected result is Negative.  Fact Sheet for Patients:  EntrepreneurPulse.com.au  Fact Sheet for Healthcare Providers:  IncredibleEmployment.be  This test is no t yet approved or cleared by the [place] FDA and  has been authorized for detection and/or diagnosis of SARS-CoV-2 by FDA under an Emergency Use Authorization (EUA). This EUA will remain  in effect (meaning this test can be used) for the duration of the COVID-19 declaration under Section 564(b)(1) of the Act, 21 U.S.C.section 360bbb-3(b)(1), unless the authorization is terminated  or revoked sooner.       Influenza A by PCR NEGATIVE NEGATIVE Final   Influenza B by PCR NEGATIVE NEGATIVE Final    Comment: (NOTE) The Xpert Xpress SARS-CoV-2/FLU/RSV plus assay is intended as an aid in the diagnosis of influenza from Nasopharyngeal swab specimens and should not be used as a sole basis for treatment. Nasal washings and aspirates are unacceptable for Xpert Xpress SARS-CoV-2/FLU/RSV testing.  Fact Sheet for Patients: EntrepreneurPulse.com.au  Fact Sheet for Healthcare Providers: IncredibleEmployment.be  This test is not yet approved or cleared by the [place] FDA and has been authorized for detection and/or diagnosis of SARS-CoV-2 by FDA under an Emergency Use Authorization (EUA). This EUA will remain in effect (meaning this test can be used) for the duration of the COVID-19 declaration under Section 564(b)(1) of the Act, 21 U.S.C. section 360bbb-3(b)(1), unless the authorization is terminated or revoked.  Performed at [facility], [place] [address], [place], [place] [address]   Urine Culture     Status: Abnormal   Collection Time: 05/07/21  5:02 PM   Specimen: Urine, Clean Catch  Result Value Ref Range Status   Specimen Description   Final    URINE, CLEAN CATCH Performed at [facility], [place] [address] [place], [place] [address]    Special Requests   Final    NONE Performed at [facility], [place] [address] [place], [place] [address]    Culture MULTIPLE SPECIES PRESENT, SUGGEST RECOLLECTION (A)  Final   Report Status 05/09/2021 FINAL  Final  Culture, blood (Routine X 2) w Reflex to ID Panel     Status: None (Preliminary result)   Collection Time: 05/07/21  5:03 PM   Specimen: BLOOD  Result Value Ref Range Status   Specimen Description   Final  BLOOD LEFT ANTECUBITAL Performed at [place] [address] [place], [place] [address]    Special Requests   Final    BOTTLES DRAWN AEROBIC AND ANAEROBIC Blood Culture adequate volume Performed at [facility] [address] [place], [place] [address]    Culture   Final    NO GROWTH 3 DAYS Performed at [facility], [place] [address] [place], [place] [address]    Report Status PENDING  Incomplete  Culture, blood (Routine X 2) w Reflex to ID Panel     Status: None (Preliminary result)   Collection Time: 05/07/21  5:08 PM   Specimen: BLOOD  Result Value Ref Range Status   Specimen Description   Final    BLOOD BLOOD LEFT FOREARM Performed at [facility] [address], [place], [place] [address]    Special Requests   Final    BOTTLES DRAWN AEROBIC ONLY Blood Culture results may not be optimal due to an inadequate volume of blood received in culture bottles Performed at [facility] [address], [place], [place] [address]    Culture  Setup Time   Final    GRAM POSITIVE COCCI IN CLUSTERS AEROBIC BOTTLE ONLY CRITICAL  RESULT CALLED TO, READ BACK BY AND VERIFIED WITH: PHARM D J.Scherrie November ON [id number] AT 0957 BY E.PARRISH Performed at [facility], [place] [address] [place], [place] [address]    Culture GRAM POSITIVE COCCI  Final   Report Status PENDING  Incomplete  Blood Culture ID Panel (Reflexed)     Status: None   Collection Time: 05/07/21  5:08 PM  Result Value Ref Range Status   Enterococcus faecalis NOT DETECTED NOT DETECTED Final   Enterococcus Faecium NOT DETECTED NOT DETECTED Final   Listeria monocytogenes NOT DETECTED NOT DETECTED Final   Staphylococcus species NOT DETECTED NOT DETECTED Final   Staphylococcus aureus (BCID) NOT DETECTED NOT DETECTED Final   Staphylococcus epidermidis NOT DETECTED NOT DETECTED Final   Staphylococcus lugdunensis NOT DETECTED NOT DETECTED Final   Streptococcus species NOT DETECTED NOT DETECTED Final   Streptococcus agalactiae NOT DETECTED NOT DETECTED Final   Streptococcus pneumoniae NOT DETECTED NOT DETECTED Final   Streptococcus pyogenes NOT DETECTED NOT DETECTED Final   A.calcoaceticus-baumannii NOT DETECTED NOT DETECTED Final   Bacteroides fragilis NOT DETECTED NOT DETECTED Final   Enterobacterales NOT DETECTED NOT DETECTED Final   Enterobacter cloacae complex NOT DETECTED NOT DETECTED Final   Escherichia coli NOT DETECTED NOT DETECTED Final   Klebsiella aerogenes NOT DETECTED NOT DETECTED Final   Klebsiella oxytoca NOT DETECTED NOT DETECTED Final   Klebsiella pneumoniae NOT DETECTED NOT DETECTED Final   Proteus species NOT DETECTED NOT DETECTED Final   Salmonella species NOT DETECTED NOT DETECTED Final   Serratia marcescens NOT DETECTED NOT DETECTED Final   Haemophilus influenzae NOT DETECTED NOT DETECTED Final   Neisseria meningitidis NOT DETECTED NOT DETECTED Final   Pseudomonas aeruginosa NOT DETECTED NOT DETECTED Final   Stenotrophomonas maltophilia NOT DETECTED NOT DETECTED Final   Candida albicans NOT DETECTED NOT DETECTED Final   Candida auris  NOT DETECTED NOT DETECTED Final   Candida glabrata NOT DETECTED NOT DETECTED Final   Candida krusei NOT DETECTED NOT DETECTED Final   Candida parapsilosis NOT DETECTED NOT DETECTED Final   Candida tropicalis NOT DETECTED NOT DETECTED Final   Cryptococcus neoformans/gattii NOT DETECTED NOT DETECTED Final    Comment: Performed at [facility], [address], [place],  [address]    Imaging: CT PELVIS W CONTRAST  Result Date: 05/10/2021 CLINICAL DATA:  Buttock and thigh cellulitis, concern for undrained abscess. EXAM: CT PELVIS WITH CONTRAST TECHNIQUE: Multidetector CT imaging of the pelvis was performed using the standard protocol following the bolus administration of intravenous contrast. CONTRAST:  133m OMNIPAQUE IOHEXOL 350 MG/ML SOLN COMPARISON:  CT 09/10/2020 FINDINGS: Urinary Tract: Circumferential thickening of the urinary bladder, greater than expected for underdistention. Hazy perivesicular stranding. Bowel: Some questionable intramural fatty infiltration versus mural thickening of the cecum. No other conspicuous bowel wall thickening or dilatation. No evidence of obstruction within the included margins of imaging. High attenuation contrast media noted within the distal colon and partially opacifying the cecum as well. Streak artifact is noted about the deep pelvis likely related to combination of body habitus and high attenuation contrast media in this vicinity (2/26) Vascular/Lymphatic: Vascular calcifications in the aortic bifurcation included vasculature. Reactive appearing adenopathy in the inguinal chains. Reproductive: The prostate and seminal vesicles are unremarkable. No acute abnormality the included external genitalia. Other: Diffuse edematous changes of the soft tissues most pronounced laterally and along the posterior gluteal soft tissues as well as the posterior thighs and mons pubis. More focal regions of irregular skin thickening seen posteriorly as well as several  superficial foci of soft tissue gas in the left gluteal subcutaneous fat. No deeper extension of the soft tissue gas is seen. Small amount of contrast media seen within the gluteal cleft. Small fat containing umbilical hernia. Question additional regions of injectable use the low anterior abdomen as well. 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 No acute osseous abnormality or suspicious osseous lesion. Musculature appears normal and symmetric. No intramuscular collection or abscess. No worrisome stranding or fluid nor soft tissue gas seen within the fascial planes. IMPRESSION: Extensive soft tissue edema with more irregular skin thickening and subcutaneous fat stranding posteriorly along the gluteal soft tissues and proximal thighs as well as the mons pubis anteriorly. Few foci soft tissue gas noted along the left posterior gluteal tissues and proximal thigh. Recommend correlation with visual inspection for regions of cellulitis and for sites of prior intervention. No organized abscess or collection is evident. Some additional nodular soft tissue thickening along the low anterior abdominal wall, possibly related to injectable use. Circumferential bladder wall thickening. Correlate with urinalysis to exclude cystitis. Thickening versus intramural fatty infiltration of the cecum, correlate with bowel symptoms. Beam hardening/streak artifact seen across the pelvis related to concentrated contrast media within the distal rectum. Appears artifactual. Should Electronically Signed   By: PLovena LeM.D.   On: 05/10/2021 19:30

## 2021-05-11 NOTE — Progress Notes (Signed)
Pharmacy Antibiotic Note  Alter Moss Liberto is a 35 y.o. male with hx HIV presented o the ED on 05/07/2021 with c/o leg swelling. LE doppler was negative for DVT.  He's currently on vancomycin and ceftriaxone for LE and gluteal cellulitis/wound.  Today, 05/11/2021: - day #4 abx - Tmax 99.8, wbc 19.3 - scr is elevated but down slightly to 1.68 (crcl~60N). On LR @ 100 ml/hr - 1 of 3 blood culture bottles  with GPC in clusters (nothing on BCID) - 8/1 at 1039 Vanc trough= 43  (goal 10-15)--> hold vancomycin - 8/2 at 1100 vanc random = 22 (37 hrs after the 1250 mg dose); Ke= [id number], t1/2= 37 hrs, Vd 59L   Plan: - vancomycin 1250 mg IV x1 at 10p today. Will f/u wit renal function and re-dose when appropriate - ceftriaxone 2gm IV q24h ________________________________  Height: 6' 3"  (190.5 cm) Weight: 109.1 kg (240 lb 8.4 oz) IBW/kg (Calculated) : 84.5  Temp (24hrs), Avg:98.9 F (37.2 C), Min:98.2 F (36.8 C), Max:99.8 F (37.7 C)  Recent Labs  Lab 05/07/21 1444 05/07/21 1912 05/07/21 2332 05/08/21 0401 05/09/21 0544 05/10/21 0416 05/10/21 1039 05/11/21 0429  WBC  --  18.3*  --  19.1* 17.7* 19.8*  --  19.3*  CREATININE  --   --  1.32* 1.35* 1.57* 1.71*  --  1.68*  LATICACIDVEN 0.8  --   --   --   --   --   --   --   VANCOTROUGH  --   --   --   --   --   --  43*  --      Estimated Creatinine Clearance: 80.3 mL/min (A) (by C-G formula based on SCr of 1.68 mg/dL (H)).    Allergies  Allergen Reactions   Dapsone Other (See Comments)    Per centricity "G6PD deficient"   Primaquine Phosphate Other (See Comments)    Per Centricity "G6PD deficient"   7/30 vancomycin >>  7/30 ceftriaxone >> (8/5)   8/1 at 1039 VT = 43 --> hold vancomycin 8/2 at 1100 VR = 22 (37 hrs after the 1250 mg dose); Ke= [id number], t1/2= 37 hrs, Vd 59L   7/29 BCx x2: 1 of 3 bottles with GPC in clusters (nothing on BCID) 7/29 UCx: multi species FINAL  Thank you for allowing pharmacy to be a part of this  patient's care.  Lynelle Doctor 05/11/2021 7:26 AM

## 2021-05-11 NOTE — Consult Note (Signed)
[facility]    Date of Admission:  05/07/2021     Reason for Consult: HIV, lower extremity wounds     Referring Physician: Dr Tana Coast  Current antibiotics: Vanco 7/30-pres Ceftriaxone 7/29-pres   ASSESSMENT:    Lower extremity cellulitis with sacral/gluteal/thigh wounds HIV GPC positive blood culture  PLAN:    Continue vancomycin and ceftriaxone Continue Biktarvy Suspect positive blood culture to be contaminant.  Will follow for identification Follow HIV RNA Continue wound care   Principal Problem:   Acute metabolic encephalopathy Active Problems:   Schizoaffective disorder, depressive type (HCC)   Severe alcohol dependence (HCC)   GERD (gastroesophageal reflux disease)   Elevated LFTs   HIV disease (HCC)   Anemia   Cellulitis   MEDICATIONS:    Scheduled Meds: . albuterol  2.5 mg Nebulization BID  . ALPRAZolam  2 mg Oral TID  . bictegravir-emtricitabine-tenofovir AF  1 tablet Oral Daily  . escitalopram  20 mg Oral Daily  . famotidine  40 mg Oral Daily  . folic acid  1 mg Oral Daily  . Gerhardt's butt cream   Topical BID  . guaiFENesin  1,200 mg Oral BID  . heparin  5,000 Units Subcutaneous Q8H  . lurasidone  120 mg Oral q1800  . multivitamin with minerals  1 tablet Oral Daily  . nicotine  21 mg Transdermal Daily  . oxyCODONE-acetaminophen  1 tablet Oral Once  . pantoprazole  40 mg Oral BID  . thiamine  250 mg Oral Daily  . valACYclovir  1,000 mg Oral Daily  . vancomycin variable dose per unstable renal function (pharmacist dosing)   Does not apply See admin instructions   Continuous Infusions: . cefTRIAXone (ROCEPHIN)  IV Stopped (05/10/21 1859)   PRN Meds:.acetaminophen **OR** acetaminophen, benzonatate, oxyCODONE-acetaminophen, trazodone  HPI:    [name] is a 35 y.o. male with HIV followed at [place] and on [place].  Recent HIV labs from January 2022 with VL just 21 copies and healthy CD4 count.  He presents now to  [facility] via EMS on 7/29 with encephalopathy.  He was found at home surrounded by EtOH bottles with LE edema and bilateral leg wounds.  Reportedly had been off his medications.  WOC was consulted but felt that scope was more appropriate for surgical and ID consultation which has been obtained.  He was continued on Biktarvy and placed on Ceftriaxone and Vancomycin.    Surgery was consulted who obtained a CT of the pelvis with contrast that showed extensive soft tissue edema with irregular skin thickening and subcutaneous fat stranding posteriorly along the gluteal soft tissues and proximal thighs.  There was some foci of soft tissue gas possibly suggesting cellulitis but no organized abscess or collection was noted.  They did not feel that any of this was amenable to I&D.  He currently remains afebrile over the past 24 hours.  He continues to be anemic with a hemoglobin today of 6.5 and a WBC of 19.  His HIV viral load is currently pending, however, CD4 count is 1000 (55%).  He reports that he missed just a couple days of his Biktarvy.  He continues on ceftriaxone and vancomycin.  1 out of 3 admission blood culture bottles today is positive for GPC's in clusters at day 3.  BC ID did not identify any targets.   Labs at admit:  albumin 1.9, AST 43, ALT 28, creatinine 1.3.  WBC 18.5, hemoglobin 7.6.  Blood cx NGTD.  CD4 count pending.  VL not done.   Past Medical History:  Diagnosis Date  . Bipolar 1 disorder ([place])   . Depression   . Dizziness and giddiness 02/01/2016  . Herpes genitalia   . HIV disease ([place])   . Hypertension   . Migraine headache 02/01/2016  . Peripheral neuropathy 10/01/2019  . PTSD (post-traumatic stress disorder)   . Schizoaffective disorder ([place])   . Seizures ([place])     Social History   Tobacco Use  . Smoking status: Every Day    Packs/day: 1.00    Types: Cigarettes  . Smokeless tobacco: Never  Vaping Use  . Vaping Use: Never used  Substance Use Topics  . Alcohol use: Yes     Alcohol/week: 14.0 standard drinks    Types: 14 Cans of beer per week    Comment: 5-6 40oz per day  . Drug use: No    Family History  Problem Relation Age of Onset  . Alcohol abuse Mother   . Schizophrenia Father   . Depression Father   . Alcohol abuse Father   . Alcohol abuse Paternal Uncle   . Alcohol abuse Paternal Uncle     Allergies  Allergen Reactions  . Dapsone Other (See Comments)    Per centricity "G6PD deficient"  . Primaquine Phosphate Other (See Comments)    Per Centricity "G6PD deficient"    Review of Systems  Constitutional:  Negative for chills and fever.  Respiratory: Negative.    Cardiovascular: Negative.   Gastrointestinal: Negative.   Genitourinary: Negative.   Skin:        + Wounds  All other systems reviewed and are negative.  OBJECTIVE:   Blood pressure 119/62, pulse 97, temperature 98.3 F (36.8 C), temperature source Oral, resp. rate 14, height 6' 3"  (1.905 m), weight 109.1 kg, SpO2 100 %. Body mass index is 30.06 kg/m.  Physical Exam Constitutional:      General: He is not in acute distress.    Appearance: Normal appearance.  HENT:     Head: Normocephalic and atraumatic.  Eyes:     Extraocular Movements: Extraocular movements intact.     Conjunctiva/sclera: Conjunctivae normal.  Cardiovascular:     Rate and Rhythm: Normal rate and regular rhythm.  Pulmonary:     Effort: Pulmonary effort is normal. No respiratory distress.     Breath sounds: Normal breath sounds.  Abdominal:     General: There is no distension.     Palpations: Abdomen is soft.     Tenderness: There is no abdominal tenderness.  Musculoskeletal:        General: Normal range of motion.  Skin:    Comments: Lower extremity wounds noted.  Currently without much purulent drainage or odor.   Neurological:     General: No focal deficit present.     Mental Status: He is alert and oriented to person, place, and time.  Psychiatric:        Mood and Affect: Mood normal.         Behavior: Behavior normal.     Lab Results: Lab Results  Component Value Date   WBC 19.3 (H) 05/11/2021   HGB 6.5 (LL) 05/11/2021   HCT 20.6 (L) 05/11/2021   MCV 102.0 (H) 05/11/2021   PLT 147 (L) 05/11/2021    Lab Results  Component Value Date   NA 137 05/11/2021   K 3.3 (L) 05/11/2021   CO2 24 05/11/2021   GLUCOSE 100 (H) 05/11/2021   BUN 12  05/11/2021   CREATININE 1.68 (H) 05/11/2021   CALCIUM 7.8 (L) 05/11/2021   GFRNONAA 53 (L) 05/11/2021   GFRAA 87 11/25/2020    Lab Results  Component Value Date   ALT 30 05/11/2021   AST 41 05/11/2021   ALKPHOS 165 (H) 05/11/2021   BILITOT 1.1 05/11/2021       Component Value Date/Time   CRP 0.8 11/01/2019 0028   CRP 24 (H) 10/01/2019 1700       Component Value Date/Time   ESRSEDRATE 65 (H) 10/01/2019 1700    I have reviewed the micro and lab results in Epic.  Imaging: CT PELVIS W CONTRAST  Result Date: 05/10/2021 CLINICAL DATA:  Buttock and thigh cellulitis, concern for undrained abscess. EXAM: CT PELVIS WITH CONTRAST TECHNIQUE: Multidetector CT imaging of the pelvis was performed using the standard protocol following the bolus administration of intravenous contrast. CONTRAST:  147m OMNIPAQUE IOHEXOL 350 MG/ML SOLN COMPARISON:  CT 09/10/2020 FINDINGS: Urinary Tract: Circumferential thickening of the urinary bladder, greater than expected for underdistention. Hazy perivesicular stranding. Bowel: Some questionable intramural fatty infiltration versus mural thickening of the cecum. No other conspicuous bowel wall thickening or dilatation. No evidence of obstruction within the included margins of imaging. High attenuation contrast media noted within the distal colon and partially opacifying the cecum as well. Streak artifact is noted about the deep pelvis likely related to combination of body habitus and high attenuation contrast media in this vicinity (2/26) Vascular/Lymphatic: Vascular calcifications in the aortic  bifurcation included vasculature. Reactive appearing adenopathy in the inguinal chains. Reproductive: The prostate and seminal vesicles are unremarkable. No acute abnormality the included external genitalia. Other: Diffuse edematous changes of the soft tissues most pronounced laterally and along the posterior gluteal soft tissues as well as the posterior thighs and mons pubis. More focal regions of irregular skin thickening seen posteriorly as well as several superficial foci of soft tissue gas in the left gluteal subcutaneous fat. No deeper extension of the soft tissue gas is seen. Small amount of contrast media seen within the gluteal cleft. Small fat containing umbilical hernia. Question additional regions of injectable use the low anterior abdomen as well. 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 No acute osseous abnormality or suspicious osseous lesion. Musculature appears normal and symmetric. No intramuscular collection or abscess. No worrisome stranding or fluid nor soft tissue gas seen within the fascial planes. IMPRESSION: Extensive soft tissue edema with more irregular skin thickening and subcutaneous fat stranding posteriorly along the gluteal soft tissues and proximal thighs as well as the mons pubis anteriorly. Few foci soft tissue gas noted along the left posterior gluteal tissues and proximal thigh. Recommend correlation with visual inspection for regions of cellulitis and for sites of prior intervention. No organized abscess or collection is evident. Some additional nodular soft tissue thickening along the low anterior abdominal wall, possibly related to injectable use. Circumferential bladder wall thickening. Correlate with urinalysis to exclude cystitis. Thickening versus intramural fatty infiltration of the cecum, correlate with bowel symptoms. Beam hardening/streak artifact seen across the pelvis related to concentrated  contrast media within the distal rectum. Appears artifactual. Should Electronically Signed   By: PLovena LeM.D.   On: 05/10/2021 19:30     Imaging independently reviewed in Epic.  ARaynelle Highlandfor Infectious Disease CRochesterGroup [phone] 05/11/2021, 11:53 AM  I spent greater than 110 minutes with the patient including greater than 50% of time  in face to face counsel of the patient and in coordination of their care.

## 2021-05-11 NOTE — Progress Notes (Signed)
[facility]                                                                              Patient Demographics  [name], is a 35 y.o. male, DOB - Jun 04, 1986, OMB:[id number]  Admit date - 05/07/2021   Admitting Physician Shaylon Gillean [name], MD  Outpatient Primary MD for the patient is [name], MD  Outpatient specialists:   LOS - 4  days   Medical records reviewed and are as summarized below:    Chief Complaint  Patient presents with   Leg Swelling       Brief summary   Patient is a 35 year old male with history of HIV, PTSD, schizoaffective disorder, seizures, heavy alcohol abuse, hypertension was brought to ED via EMS for altered mental status.  I was unable to obtain any history from the patient due to his mental status (whispering and rambling).  He was able to tell me his name.  Per EMS and EDP records, patient lives alone.  Patient's family called EMS because he was not taking his medications.  He was laying on the couch for the last 2 weeks, covered in feces and urine.  Patient was surrounded by many alcohol bottles.  He was also noted to have increased lower extremity swelling, sores and redness to the feet.  Interval summary 7/30-8/2  Patient was admitted with altered mental status, found on the couch covered in feces and urine, alcohol bottles Received high-dose IV thiamine for 3 days, completed. Patient has extensive cellulitis and wounds of the bilateral buttocks, posterior thighs with purulent and bloody drainage.  Placed on IV vancomycin and Rocephin. General surgery was consulted, CT scans showed drainable abscess.   Blood cultures positive for GPC in clusters, continue current IV antibiotics.  ID consulted  Plan to pursue LTAC for wound care, IV antibiotics.  Assessment & Plan   Principal Problem:   Acute metabolic encephalopathy: Unclear etiology, possibly Warnicke's encephalopathy/Korsakoff syndrome or hepatic encephalopathy or  seizure or due to acute infection/cellulitis, uremia, has underlying psych history -CT head showed no acute intracranial abnormality, increase fluid in the right ethmoid air cells. -Ammonia level 30, TSH 1.1, B1 pending. -Mental status is improving, completed high-dose thiamine 500 mg 3 times daily for 3 days, continue 250 mg daily for now.  -Continue dysphagia diet per SLP evaluation -Psychiatry also following  Active Problems:     Cellulitis, lower extremity edema with wounds, sacral/gluteal/thigh wounds -Lower extremity edema likely due to severe hypoalbuminemia and third spacing.  Improving. -2D echo showed EF of 60 to 65%, G1 DD -Venous Dopplers negative for DVT -Appreciate general surgery and ID recommendations -Continue IV vancomycin and cefepime  GPC bacteremia -  1/3 blood cultures positive for GPC in clusters, will continue IV vancomycin -ID following  Acute on chronic anemia, macrocytic anemia, thrombocytopenia -FOBT negative on 7/29 -Anemia panel showed anemia of chronic disease -Continues to bleed from his wounds on the posterior thighs, gluteal and sacral area.   -Hemoglobin 6.5, transfuse 1 unit packed RBCs (has received 1unit on 7/31, 8/1)  Transaminitis with elevated alk phos, AST, thrombocytopenia: Likely due to alcohol abuse -Ammonia  level 30 -abdominal ultrasound did not show any cholelithiasis or biliary duct dilatation, showed hepatic steatosis. -LFTs are improving   Acute kidney injury -AKI possibly due to dehydration, poor oral intake, intravascular depletion -Creatinine 1.3 at the time of admission, baseline 1.1 on 12/03/2020, patient was placed on IV fluid hydration, plateaued at 1.7 on 8/1 improving to 1.6 -Now appears to have some fluid overload, will discontinue IV fluids, Lasix 20 mg IV x1  Hypokalemia -Potassium 3.3, replaced   HIV disease -Patient has been noncompliant with his antiretrovirals, check viral load -resume Biktarvy, valacyclovir.   ID consulted     Schizoaffective disorder, depressive type ([place]) -Appreciate psychiatry recommendations     Severe alcohol dependence ([place]) with likely acute alcohol withdrawal, possible Warnicke's encephalopathy -Alcohol level <10, although was surrounded by several alcohol bottles, somewhat tremulous on admission --Completed high-dose thiamine for 3 days, now on thiamine 250 mg daily.  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   -Left hip x-ray showed chronic and absent fracture of the left femoral head similar to prior exam -Currently scheduled for surgery on 05/18/2021 outpatient (Dr. Alvan Dame) for total hip arthroplasty.  Patient asking if the surgery can be done during this hospitalization.  I had requested orthopedics consultation on 7/31, will see the patient however likely no surgical plan inpatient due to acute issues    Obesity Estimated body mass index is 30.06 kg/m as calculated from the following:   Height as of this encounter: 6' 3"  (1.905 m).   Weight as of this encounter: 109.1 kg.  Code Status: Full CODE STATUS DVT Prophylaxis:  heparin injection 5,000 Units Start: 05/07/21 2200   Level of Care: Level of care: Progressive Family Communication: Discussed all imaging results, lab results, explained to the patient    Disposition Plan:     Status is: Inpatient  Remains inpatient appropriate because:Inpatient level of care appropriate due to severity of illness  Dispo: The patient is from: Home              Anticipated d/c is to:  TBD              Patient currently is not medically stable to d/c.  Patient clearly unable to care for himself at home.  Discussed with TOC, will pursue LTAC   Difficult to place patient No      Time Spent in minutes 25  minutes  Procedures:  CT head, abdominal ultrasound CT pelvis  Consultants:   Orthopedics General surgery Infectious disease   Antimicrobials:   Anti-infectives (From admission, onward)    Start     Dose/Rate Route Frequency Ordered Stop   05/10/21 1022  vancomycin variable dose per unstable renal function (pharmacist dosing)         Does not apply See admin instructions 05/10/21 1022     05/08/21 2200  vancomycin (VANCOREADY) IVPB 1250 mg/250 mL  Status:  Discontinued        1,250 mg 166.7 mL/hr over 90 Minutes Intravenous Every 12 hours 05/08/21 1044 05/10/21 1021   05/08/21 1100  vancomycin (VANCOREADY) IVPB 2000 mg/400 mL        2,000 mg 200 mL/hr over 120 Minutes Intravenous  Once 05/08/21 1044 05/08/21 1523   05/07/21 1915  cefTRIAXone (  ROCEPHIN) 2 g in sodium chloride 0.9 % 100 mL IVPB        2 g 200 mL/hr over 30 Minutes Intravenous Every 24 hours 05/07/21 1912 05/14/21 1759   05/07/21 1915  bictegravir-emtricitabine-tenofovir AF (BIKTARVY) 50-200-25 MG per tablet 1 tablet        1 tablet Oral Daily 05/07/21 1912     05/07/21 1915  valACYclovir (VALTREX) tablet 1,000 mg        1,000 mg Oral Daily 05/07/21 1912            Medications  Scheduled Meds:  albuterol  2.5 mg Nebulization BID   ALPRAZolam  2 mg Oral TID   bictegravir-emtricitabine-tenofovir AF  1 tablet Oral Daily   escitalopram  20 mg Oral Daily   famotidine  40 mg Oral Daily   folic acid  1 mg Oral Daily   Gerhardt's butt cream   Topical BID   guaiFENesin  1,200 mg Oral BID   heparin  5,000 Units Subcutaneous Q8H   lurasidone  120 mg Oral q1800   multivitamin with minerals  1 tablet Oral Daily   nicotine  21 mg Transdermal Daily   oxyCODONE-acetaminophen  1 tablet Oral Once   pantoprazole  40 mg Oral BID   thiamine  250 mg Oral Daily   valACYclovir  1,000 mg Oral Daily   vancomycin variable dose per unstable renal function (pharmacist dosing)   Does not apply See admin instructions    Continuous Infusions:  cefTRIAXone (ROCEPHIN)  IV Stopped (05/10/21 1859)   PRN Meds:.acetaminophen **OR** acetaminophen, benzonatate, oxyCODONE-acetaminophen, trazodone      Subjective:   [name] was seen and examined today.  Feeling wheezy and somewhat short of breath.  No fevers, no acute nausea vomiting or diarrhea.  Objective:   Vitals:   05/11/21 0815 05/11/21 0838 05/11/21 1050 05/11/21 1105  BP: 111/69 105/63 119/62   Pulse:  96 97   Resp:   14   Temp:  99.2 F (37.3 C) 98.3 F (36.8 C)   TempSrc: Oral Oral Oral   SpO2: 100% 100% 100% 100%  Weight:      Height:        Intake/Output Summary (Last 24 hours) at 05/11/2021 1246 Last data filed at 05/11/2021 1215 Gross per 24 hour  Intake 1545.16 ml  Output 1675 ml  Net -129.84 ml     Wt Readings from Last 3 Encounters:  05/08/21 109.1 kg  02/26/21 113.4 kg  12/03/20 112.9 kg   Physical Exam General: Alert and oriented x 3, NAD Cardiovascular: S1 S2 clear, RRR. Respiratory: Bilateral rhonchi with wheezing Gastrointestinal: Soft, nontender, nondistended, NBS Ext: 1+ pedal edema bilaterally Rt >Lt  Neuro: no new deficits Skin: Extensive wounds on the posterior thighs, gluteal and sacral region,  Psych: Normal affect and demeanor, alert and oriented x3    Data Reviewed:  I have personally reviewed following labs and imaging studies  Micro Results Recent Results (from the past 240 hour(s))  Resp Panel by RT-PCR (Flu A&B, Covid) Nasopharyngeal Swab     Status: None   Collection Time: 05/07/21  2:48 PM   Specimen: Nasopharyngeal Swab; Nasopharyngeal(NP) swabs in vial transport medium  Result Value Ref Range Status   SARS Coronavirus 2 by RT PCR NEGATIVE NEGATIVE Final    Comment: (NOTE) SARS-CoV-2 target nucleic acids are NOT DETECTED.  The SARS-CoV-2 RNA is generally detectable in upper respiratory specimens during the acute phase of infection. The lowest concentration of SARS-CoV-2 viral  copies this assay can detect is 138 copies/mL. A negative result does not preclude SARS-Cov-2 infection and should not be used as the sole basis for treatment or other patient management decisions. A negative result may occur with  improper specimen collection/handling, submission of specimen other than nasopharyngeal swab, presence of viral mutation(s) within the areas targeted by this assay, and inadequate number of viral copies(<138 copies/mL). A negative result must be combined with clinical observations, patient history, and epidemiological information. The expected result is Negative.  Fact Sheet for Patients:  EntrepreneurPulse.com.au  Fact Sheet for Healthcare Providers:  IncredibleEmployment.be  This test is no t yet approved or cleared by the [place] FDA and  has been authorized for detection and/or diagnosis of SARS-CoV-2 by FDA under an Emergency Use Authorization (EUA). This EUA will remain  in effect (meaning this test can be used) for the duration of the COVID-19 declaration under Section 564(b)(1) of the Act, 21 U.S.C.section 360bbb-3(b)(1), unless the authorization is terminated  or revoked sooner.       Influenza A by PCR NEGATIVE NEGATIVE Final   Influenza B by PCR NEGATIVE NEGATIVE Final    Comment: (NOTE) The Xpert Xpress SARS-CoV-2/FLU/RSV plus assay is intended as an aid in the diagnosis of influenza from Nasopharyngeal swab specimens and should not be used as a sole basis for treatment. Nasal washings and aspirates are unacceptable for Xpert Xpress SARS-CoV-2/FLU/RSV testing.  Fact Sheet for Patients: EntrepreneurPulse.com.au  Fact Sheet for Healthcare Providers: IncredibleEmployment.be  This test is not yet approved or cleared by the [place] FDA and has been authorized for detection and/or diagnosis of SARS-CoV-2 by FDA under an Emergency Use Authorization (EUA). This  EUA will remain in effect (meaning this test can be used) for the duration of the COVID-19 declaration under Section 564(b)(1) of the Act, 21 U.S.C. section 360bbb-3(b)(1), unless the authorization is terminated or revoked.  Performed at [facility], [place] [address], [place], [place] [address]   Urine Culture     Status: Abnormal   Collection Time: 05/07/21  5:02 PM   Specimen: Urine, Clean Catch  Result Value Ref Range Status   Specimen Description   Final    URINE, CLEAN CATCH Performed at [facility], [place] [address], [place], [place] [address]    Special Requests   Final    NONE Performed at [facility], [place] [address], [place], [place] [address]    Culture MULTIPLE SPECIES PRESENT, SUGGEST RECOLLECTION (A)  Final   Report Status 05/09/2021 FINAL  Final  Culture, blood (Routine X 2) w Reflex to ID Panel     Status: None (Preliminary result)   Collection Time: 05/07/21  5:03 PM   Specimen: BLOOD  Result Value Ref Range Status   Specimen Description   Final    BLOOD LEFT ANTECUBITAL Performed at [place] [address], [place], [place] [address]    Special Requests   Final    BOTTLES DRAWN AEROBIC AND ANAEROBIC Blood Culture adequate volume Performed at [place] [address], [place], [place] [address]    Culture   Final    NO GROWTH 3 DAYS Performed at [facility], [place] [address], [place], [place] [address]    Report Status PENDING  Incomplete  Culture, blood (Routine X 2) w Reflex to ID Panel     Status: None (Preliminary result)   Collection Time: 05/07/21  5:08 PM   Specimen:  BLOOD  Result Value Ref Range Status   Specimen Description   Final    BLOOD BLOOD LEFT FOREARM Performed at [place] [address], [place], [place] [address]    Special Requests   Final    BOTTLES DRAWN AEROBIC ONLY Blood Culture results may not be  optimal due to an inadequate volume of blood received in culture bottles Performed at [facility] [address], [place], [place] [address]    Culture  Setup Time   Final    GRAM POSITIVE COCCI IN CLUSTERS AEROBIC BOTTLE ONLY CRITICAL RESULT CALLED TO, READ BACK BY AND VERIFIED WITH: PHARM D J.Scherrie November ON [id number] AT 0957 BY E.PARRISH Performed at [facility], [place] [address], [place], [place] [address]    Culture GRAM POSITIVE COCCI  Final   Report Status PENDING  Incomplete  Blood Culture ID Panel (Reflexed)     Status: None   Collection Time: 05/07/21  5:08 PM  Result Value Ref Range Status   Enterococcus faecalis NOT DETECTED NOT DETECTED Final   Enterococcus Faecium NOT DETECTED NOT DETECTED Final   Listeria monocytogenes NOT DETECTED NOT DETECTED Final   Staphylococcus species NOT DETECTED NOT DETECTED Final   Staphylococcus aureus (BCID) NOT DETECTED NOT DETECTED Final   Staphylococcus epidermidis NOT DETECTED NOT DETECTED Final   Staphylococcus lugdunensis NOT DETECTED NOT DETECTED Final   Streptococcus species NOT DETECTED NOT DETECTED Final   Streptococcus agalactiae NOT DETECTED NOT DETECTED Final   Streptococcus pneumoniae NOT DETECTED NOT DETECTED Final   Streptococcus pyogenes NOT DETECTED NOT DETECTED Final   A.calcoaceticus-baumannii NOT DETECTED NOT DETECTED Final   Bacteroides fragilis NOT DETECTED NOT DETECTED Final   Enterobacterales NOT DETECTED NOT DETECTED Final   Enterobacter cloacae complex NOT DETECTED NOT DETECTED Final   Escherichia coli NOT DETECTED NOT DETECTED Final   Klebsiella aerogenes NOT DETECTED NOT DETECTED Final   Klebsiella oxytoca NOT DETECTED NOT DETECTED Final   Klebsiella pneumoniae NOT DETECTED NOT DETECTED Final   Proteus species NOT DETECTED NOT DETECTED Final   Salmonella species NOT DETECTED NOT DETECTED Final   Serratia marcescens NOT DETECTED NOT DETECTED Final   Haemophilus influenzae NOT DETECTED NOT  DETECTED Final   Neisseria meningitidis NOT DETECTED NOT DETECTED Final   Pseudomonas aeruginosa NOT DETECTED NOT DETECTED Final   Stenotrophomonas maltophilia NOT DETECTED NOT DETECTED Final   Candida albicans NOT DETECTED NOT DETECTED Final   Candida auris NOT DETECTED NOT DETECTED Final   Candida glabrata NOT DETECTED NOT DETECTED Final   Candida krusei NOT DETECTED NOT DETECTED Final   Candida parapsilosis NOT DETECTED NOT DETECTED Final   Candida tropicalis NOT DETECTED NOT DETECTED Final   Cryptococcus neoformans/gattii NOT DETECTED NOT DETECTED Final    Comment: Performed at [facility], [address], [place], [place] [address]    Radiology Reports CT Head Wo Contrast  Result Date: 05/07/2021 CLINICAL DATA:  Mental status change, unknown cause. EXAM: CT HEAD WITHOUT CONTRAST TECHNIQUE: Contiguous axial images were obtained from the base of the skull through the vertex without intravenous contrast. COMPARISON:  03/14/2020 FINDINGS: Brain: No evidence for acute hemorrhage, mass lesion, midline shift, hydrocephalus or large infarct. Again noted are calcifications in the basal ganglia. Vascular: No hyperdense vessel or unexpected calcification. Skull: Normal. Negative for fracture or focal lesion. Sinuses/Orbits: Increased fluid in the right mastoid air cells. Small focus of mucosal thickening near the junction of the right ethmoid air cells and  right frontal sinus. Other: Superficial low-density nodules along the lateral aspect of the face and temporal regions. There were similar findings on the prior examination but these superficial structures are now more conspicuous. Index lesion on the left side on sequence 2 image 1 measures 9 mm. IMPRESSION: 1. No acute intracranial abnormality. 2. Increased fluid in the right ethmoid air cells. 3. Again noted are small superficial nodules along the lateral aspect of the face. These small superficial nodular structures may have enlarged in the  interim. Recommend clinical correlation in these areas. Electronically Signed   By: Markus Daft M.D.   On: 05/07/2021 17:50   CT PELVIS W CONTRAST  Result Date: 05/10/2021 CLINICAL DATA:  Buttock and thigh cellulitis, concern for undrained abscess. EXAM: CT PELVIS WITH CONTRAST TECHNIQUE: Multidetector CT imaging of the pelvis was performed using the standard protocol following the bolus administration of intravenous contrast. CONTRAST:  165m OMNIPAQUE IOHEXOL 350 MG/ML SOLN COMPARISON:  CT 09/10/2020 FINDINGS: Urinary Tract: Circumferential thickening of the urinary bladder, greater than expected for underdistention. Hazy perivesicular stranding. Bowel: Some questionable intramural fatty infiltration versus mural thickening of the cecum. No other conspicuous bowel wall thickening or dilatation. No evidence of obstruction within the included margins of imaging. High attenuation contrast media noted within the distal colon and partially opacifying the cecum as well. Streak artifact is noted about the deep pelvis likely related to combination of body habitus and high attenuation contrast media in this vicinity (2/26) Vascular/Lymphatic: Vascular calcifications in the aortic bifurcation included vasculature. Reactive appearing adenopathy in the inguinal chains. Reproductive: The prostate and seminal vesicles are unremarkable. No acute abnormality the included external genitalia. Other: Diffuse edematous changes of the soft tissues most pronounced laterally and along the posterior gluteal soft tissues as well as the posterior thighs and mons pubis. More focal regions of irregular skin thickening seen posteriorly as well as several superficial foci of soft tissue gas in the left gluteal subcutaneous fat. No deeper extension of the soft tissue gas is seen. Small amount of contrast media seen within the gluteal cleft. Small fat containing umbilical hernia. Question additional regions of injectable use the low anterior  abdomen as well. 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 No acute osseous abnormality or suspicious osseous lesion. Musculature appears normal and symmetric. No intramuscular collection or abscess. No worrisome stranding or fluid nor soft tissue gas seen within the fascial planes. IMPRESSION: Extensive soft tissue edema with more irregular skin thickening and subcutaneous fat stranding posteriorly along the gluteal soft tissues and proximal thighs as well as the mons pubis anteriorly. Few foci soft tissue gas noted along the left posterior gluteal tissues and proximal thigh. Recommend correlation with visual inspection for regions of cellulitis and for sites of prior intervention. No organized abscess or collection is evident. Some additional nodular soft tissue thickening along the low anterior abdominal wall, possibly related to injectable use. Circumferential bladder wall thickening. Correlate with urinalysis to exclude cystitis. Thickening versus intramural fatty infiltration of the cecum, correlate with bowel symptoms. Beam hardening/streak artifact seen across the pelvis related to concentrated contrast media within the distal rectum. Appears artifactual. Should Electronically Signed   By: PLovena LeM.D.   On: 05/10/2021 19:30   UKoreaAbdomen Complete  Result Date: 05/07/2021 CLINICAL DATA:  Transaminitis. Severe occult dependence. HIV. EXAM: ABDOMEN ULTRASOUND COMPLETE COMPARISON:  03/14/2020 FINDINGS: Gallbladder: No gallstones or wall thickening visualized. No sonographic Murphy sign noted by sonographer. Common bile  duct: Diameter: 3 mm, within normal limits. Liver: Diffusely increased echogenicity of the hepatic parenchyma, consistent with hepatic steatosis. No hepatic mass identified. Portal vein is patent on color Doppler imaging with normal direction of blood flow towards the liver. IVC: No abnormality visualized.  Pancreas: Visualized portion unremarkable. Spleen: Size and appearance within normal limits. Right Kidney: Length: 10.6 cm. Echogenicity within normal limits. No mass or hydronephrosis visualized. Left Kidney: Length: 11.3 cm. Echogenicity within normal limits. No mass or hydronephrosis visualized. Abdominal aorta: No aneurysm visualized. Other findings: None. IMPRESSION: Diffuse hepatic steatosis, without significant change since prior exam. No hepatic mass or other significant abnormality identified. No evidence of gallstones or biliary ductal dilatation. Electronically Signed   By: Marlaine Hind M.D.   On: 05/07/2021 18:41   DG Chest Port 1 View  Result Date: 05/07/2021 CLINICAL DATA:  Leg swelling EXAM: PORTABLE CHEST 1 VIEW COMPARISON:  Chest x-ray dated March 15, 2020 FINDINGS: Cardiac and mediastinal contours are unchanged. Clear lungs. No pleural effusion or pneumothorax. IMPRESSION: Clear lungs. Electronically Signed   By: Yetta Glassman MD   On: 05/07/2021 16:14   DG Swallowing Func-Speech Pathology  Result Date: 05/08/2021 Formatting of this result is different from the original. Objective Swallowing Evaluation: Type of Study: Bedside Swallow Evaluation  Patient Details Name: MAKSIM PEREGOY MRN: [id number] Date of Birth: Aug 29, 1986 Today's Date: 05/08/2021 Time: SLP Start Time (ACUTE ONLY): 1050 -SLP Stop Time (ACUTE ONLY): 1110 SLP Time Calculation (min) (ACUTE ONLY): 20 min Past Medical History: Past Medical History: Diagnosis Date  Bipolar 1 disorder ([place])   Depression   Dizziness and giddiness 02/01/2016  Herpes genitalia   HIV disease ([place])   Hypertension   Migraine headache 02/01/2016  Peripheral neuropathy 10/01/2019  PTSD (post-traumatic stress disorder)   Schizoaffective disorder ([place])   Seizures ([place])  Past Surgical History: Past Surgical History: Procedure Laterality Date  BACK SURGERY    BIOPSY  02/26/2021  Procedure: BIOPSY;  Surgeon: Otis Brace, MD;  Location: [facility];   Service: Gastroenterology;;  COLONOSCOPY WITH PROPOFOL [name] 02/26/2021  Procedure: COLONOSCOPY WITH PROPOFOL;  Surgeon: Otis Brace, MD;  Location: [facility];  Service: Gastroenterology;  Laterality: [name];  ESOPHAGOGASTRODUODENOSCOPY (EGD) WITH PROPOFOL [name] 02/26/2021  Procedure: ESOPHAGOGASTRODUODENOSCOPY (EGD) WITH PROPOFOL;  Surgeon: Otis Brace, MD;  Location: [facility];  Service: Gastroenterology;  Laterality: [name];  HAND SURGERY   HPI: Patient is a 34 y.o. male with PMH: PTSD, HIV, schizoaffective disorder, seizures, heavy alcohol abuse, HTN. He was brought to ED via EMS from home for AMS. Patient's family called EMS because patient was not taking his medications and has been laying on the couch for past 2 weeks covered in feces and urine, surrounded by multiple alcohol bottles. In addition, he was noted to have increased LE swelling and sores on feet.  Patient was evaluated by SLP for swallow function in January of 2021 and MBS was completed which showed aspiration of thin liquids with larger sips, but without penetration or aspiration when taking smaller more controlled sips.  Subjective: pleasant, sitting in chair in radiology suite Assessment / Plan / Recommendation CHL IP CLINICAL IMPRESSIONS 05/08/2021 Clinical Impression Patient presents with a mild oral and a moderate pharyngeal phase dysphagia which appears to be largely sensory based. When patient consumed small to regular sized sips of thin liquids via cup, delay in swallow initiation to pyriform sinus was observed, but no aspiration or penetration and full clearance of bolus. When patient took large cup or straw sips and  when patient consuming thin liquid barium with barium tablet, liquids pooled into vallecular and pyriform sinuses before swallow was initiated, leading to aspiration before swallow initiation. Patient did immediately cough when aspiration occuring, but this was ineffective to clear aspirate. One instance of flash  penetration (PAS 2) observed with thin liquids. No appreciable difference between nectar thick liquids and thin liquids.  He exhibited mildly prolonged mastication of graham cracker, however did have full clearance of oral cavity. SLP compared today's MBS films with previous MBS in January of 2021 and no significant difference was noted in patient's swallow function. SLP is recommending Dys 3 solids, thin liquids, no straws and meds whole in puree. SLP Visit Diagnosis Dysphagia, oropharyngeal phase (R13.12) Attention and concentration deficit following -- Frontal lobe and executive function deficit following -- Impact on safety and function Mild aspiration risk;Moderate aspiration risk   CHL IP TREATMENT RECOMMENDATION 05/08/2021 Treatment Recommendations Therapy as outlined in treatment plan below   Prognosis 05/08/2021 Prognosis for Safe Diet Advancement Fair Barriers to Reach Goals Time post onset Barriers/Prognosis Comment dysphagia appears to be chronic CHL IP DIET RECOMMENDATION 05/08/2021 SLP Diet Recommendations Dysphagia 3 (Mech soft) solids;Thin liquid Liquid Administration via Cup;No straw Medication Administration Whole meds with puree Compensations Slow rate;Small sips/bites;Minimize environmental distractions Postural Changes Seated upright at 90 degrees   CHL IP OTHER RECOMMENDATIONS 05/08/2021 Recommended Consults -- Oral Care Recommendations Oral care BID Other Recommendations --   CHL IP FOLLOW UP RECOMMENDATIONS 05/08/2021 Follow up Recommendations None   CHL IP FREQUENCY AND DURATION 05/08/2021 Speech Therapy Frequency (ACUTE ONLY) min 1 x/week Treatment Duration 1 week      CHL IP ORAL PHASE 05/08/2021 Oral Phase Impaired Oral - Pudding Teaspoon -- Oral - Pudding Cup -- Oral - Honey Teaspoon -- Oral - Honey Cup -- Oral - Nectar Teaspoon -- Oral - Nectar Cup -- Oral - Nectar Straw -- Oral - Thin Teaspoon -- Oral - Thin Cup -- Oral - Thin Straw -- Oral - Puree -- Oral - Mech Soft Impaired  mastication;Delayed oral transit Oral - Regular -- Oral - Multi-Consistency -- Oral - Pill -- Oral Phase - Comment --  CHL IP PHARYNGEAL PHASE 05/08/2021 Pharyngeal Phase Impaired Pharyngeal- Pudding Teaspoon -- Pharyngeal -- Pharyngeal- Pudding Cup -- Pharyngeal -- Pharyngeal- Honey Teaspoon -- Pharyngeal -- Pharyngeal- Honey Cup -- Pharyngeal -- Pharyngeal- Nectar Teaspoon -- Pharyngeal -- Pharyngeal- Nectar Cup Delayed swallow initiation-pyriform sinuses Pharyngeal -- Pharyngeal- Nectar Straw Delayed swallow initiation-pyriform sinuses Pharyngeal -- Pharyngeal- Thin Teaspoon -- Pharyngeal -- Pharyngeal- Thin Cup Delayed swallow initiation-pyriform sinuses;Reduced airway/laryngeal closure;Penetration/Aspiration before swallow Pharyngeal Material enters airway, passes BELOW cords and not ejected out despite cough attempt by patient;Material enters airway, remains ABOVE vocal cords then ejected out Pharyngeal- Thin Straw Delayed swallow initiation-pyriform sinuses;Reduced airway/laryngeal closure;Penetration/Aspiration before swallow Pharyngeal Material enters airway, passes BELOW cords and not ejected out despite cough attempt by patient Pharyngeal- Puree Delayed swallow initiation-vallecula Pharyngeal -- Pharyngeal- Mechanical Soft Delayed swallow initiation-vallecula Pharyngeal -- Pharyngeal- Regular -- Pharyngeal -- Pharyngeal- Multi-consistency -- Pharyngeal -- Pharyngeal- Pill Reduced pharyngeal peristalsis Pharyngeal -- Pharyngeal Comment --  CHL IP CERVICAL ESOPHAGEAL PHASE 05/08/2021 Cervical Esophageal Phase Impaired Pudding Teaspoon -- Pudding Cup -- Honey Teaspoon -- Honey Cup -- Nectar Teaspoon -- Nectar Cup -- Nectar Straw -- Thin Teaspoon -- Thin Cup -- Thin Straw -- Puree -- Mechanical Soft -- Regular -- Multi-consistency -- Pill Prominent cricopharyngeal segment Cervical Esophageal Comment -- Sonia Baller, MA, CCC-SLP Speech Therapy  ECHOCARDIOGRAM COMPLETE  Result Date: 05/08/2021     ECHOCARDIOGRAM REPORT   Patient Name:   JOSELITO FIELDHOUSE Date of Exam: 05/08/2021 Medical Rec #:  [id number]        Height:       75.0 in Accession #:    [id number]       Weight:       250.0 lb Date of Birth:  Feb 26, 1986        BSA:          2.413 m Patient Age:    74 years         BP:           104/89 mmHg Patient Gender: M                HR:           95 bpm. Exam Location:  Inpatient Procedure: 2D Echo, Color Doppler and Cardiac Doppler Indications:    I50.9* Heart failure (unspecified)  History:        Patient has prior history of Echocardiogram examinations, most                 recent 11/01/2019. Risk Factors:ETOH Abuse, HIV, COVID+ 10/2019                 and Hypertension.  Sonographer:    Raquel Sarna Senior RDCS Referring Phys: [id number] Marquavis Hannen K Mandolin Falwell IMPRESSIONS  1. Left ventricular ejection fraction, by estimation, is 60 to 65%. The left ventricle has normal function. The left ventricle has no regional wall motion abnormalities. Left ventricular diastolic parameters are consistent with Grade I diastolic dysfunction (impaired relaxation).  2. Right ventricular systolic function is normal. The right ventricular size is normal.  3. The mitral valve is normal in structure. No evidence of mitral valve regurgitation. No evidence of mitral stenosis.  4. The aortic valve is tricuspid. Aortic valve regurgitation is not visualized. No aortic stenosis is present. FINDINGS  Left Ventricle: Left ventricular ejection fraction, by estimation, is 60 to 65%. The left ventricle has normal function. The left ventricle has no regional wall motion abnormalities. The left ventricular internal cavity size was normal in size. There is  no left ventricular hypertrophy. Left ventricular diastolic parameters are consistent with Grade I diastolic dysfunction (impaired relaxation). Indeterminate filling pressures. Right Ventricle: The right ventricular size is normal. No increase in right ventricular wall thickness. Right ventricular systolic  function is normal. Left Atrium: Left atrial size was normal in size. Right Atrium: Right atrial size was normal in size. Pericardium: There is no evidence of pericardial effusion. Mitral Valve: The mitral valve is normal in structure. No evidence of mitral valve regurgitation. No evidence of mitral valve stenosis. Tricuspid Valve: The tricuspid valve is normal in structure. Tricuspid valve regurgitation is not demonstrated. No evidence of tricuspid stenosis. Aortic Valve: The aortic valve is tricuspid. Aortic valve regurgitation is not visualized. No aortic stenosis is present. Pulmonic Valve: The pulmonic valve was normal in structure. Pulmonic valve regurgitation is not visualized. No evidence of pulmonic stenosis. Aorta: The aortic root is normal in size and structure. Venous: The inferior vena cava was not well visualized. IAS/Shunts: No atrial level shunt detected by color flow Doppler.  LEFT VENTRICLE PLAX 2D LVIDd:         3.60 cm  Diastology LVIDs:         2.30 cm  LV e' medial:    7.07 cm/s LV PW:  1.00 cm  LV E/e' medial:  10.7 LV IVS:        0.90 cm  LV e' lateral:   7.72 cm/s LVOT diam:     2.20 cm  LV E/e' lateral: 9.8 LV SV:         82 LV SV Index:   34 LVOT Area:     3.80 cm  RIGHT VENTRICLE RV S prime:     16.80 cm/s TAPSE (M-mode): 1.7 cm LEFT ATRIUM             Index       RIGHT ATRIUM           Index LA diam:        3.50 cm 1.45 cm/m  RA Area:     11.50 cm LA Vol (A2C):   43.9 ml 18.20 ml/m RA Volume:   24.90 ml  10.32 ml/m LA Vol (A4C):   46.6 ml 19.31 ml/m LA Biplane Vol: 49.0 ml 20.31 ml/m  AORTIC VALVE LVOT Vmax:   108.00 cm/s LVOT Vmean:  83.200 cm/s LVOT VTI:    0.216 m  AORTA Ao Root diam: 3.30 cm Ao Asc diam:  3.00 cm MITRAL VALVE               TRICUSPID VALVE MV Area (PHT): 3.99 cm    TR Peak grad:   11.7 mmHg MV Decel Time: 190 msec    TR Vmax:        171.00 cm/s MV E velocity: 75.90 cm/s MV A velocity: 93.60 cm/s  SHUNTS MV E/A ratio:  0.81        Systemic VTI:  0.22 m                             Systemic Diam: 2.20 cm Skeet Latch MD Electronically signed by Skeet Latch MD Signature Date/Time: 05/08/2021/3:58:48 PM    Final    DG HIP UNILAT WITH PELVIS 2-3 VIEWS LEFT  Result Date: 05/08/2021 CLINICAL DATA:  Brought in by EMS due to health concerns EXAM: DG HIP (WITH OR WITHOUT PELVIS) 2-3V LEFT COMPARISON:  CT abdomen pelvis 09/10/2020 FINDINGS: Unchanged chronic collapse and fracture of the left femoral head most consistent with osteonecrosis. No new fracture or dislocation. IMPRESSION: Chronic collapsed and fractured left femoral head similar to prior exam. Electronically Signed   By: Miachel Roux M.D.   On: 05/08/2021 10:37   VAS [place] LOWER EXTREMITY VENOUS (DVT)  Result Date: 05/08/2021  Lower Venous DVT Study Patient Name:  SEVAG SHEARN  Date of Exam:   05/08/2021 Medical Rec #: [id number]         Accession #:    [id number] Date of Birth: 28-Aug-1986         Patient Gender: M Patient Age:   35Y Exam Location:  [facility] Procedure:      VAS [place] LOWER EXTREMITY VENOUS (DVT) Referring Phys: [id number] Allyiah Gartner K Stephan Draughn --------------------------------------------------------------------------------  Indications: Edema.  Limitations: Poor ultrasound/tissue interface and body habitus. Comparison Study: No prior study Performing Technologist: Maudry Mayhew MHA, RDMS, RVT, RDCS  Examination Guidelines: A complete evaluation includes B-mode imaging, spectral Doppler, color Doppler, and power Doppler as needed of all accessible portions of each vessel. Bilateral testing is considered an integral part of a complete examination. Limited examinations for reoccurring indications may be performed as noted. The reflux portion of the exam is performed with the patient in reverse Trendelenburg.  +---------+---------------+---------+-----------+----------+--------------+  RIGHT    CompressibilityPhasicitySpontaneityPropertiesThrombus Aging  +---------+---------------+---------+-----------+----------+--------------+ CFV      Full           Yes      Yes                                 +---------+---------------+---------+-----------+----------+--------------+ SFJ      Full                                                        +---------+---------------+---------+-----------+----------+--------------+ FV Prox  Full                                                        +---------+---------------+---------+-----------+----------+--------------+ FV Mid   Full                                                        +---------+---------------+---------+-----------+----------+--------------+ FV DistalFull                                                        +---------+---------------+---------+-----------+----------+--------------+ PFV      Full                                                        +---------+---------------+---------+-----------+----------+--------------+ POP      Full           Yes      Yes                                 +---------+---------------+---------+-----------+----------+--------------+ PTV      Full                    Yes                                 +---------+---------------+---------+-----------+----------+--------------+ PERO     Full                    Yes                                 +---------+---------------+---------+-----------+----------+--------------+   +---------+---------------+---------+-----------+----------+--------------+ LEFT     CompressibilityPhasicitySpontaneityPropertiesThrombus Aging +---------+---------------+---------+-----------+----------+--------------+ CFV      Full           Yes      Yes                                 +---------+---------------+---------+-----------+----------+--------------+  SFJ      Full                                                         +---------+---------------+---------+-----------+----------+--------------+ FV Prox  Full                                                        +---------+---------------+---------+-----------+----------+--------------+ FV Mid   Full                                                        +---------+---------------+---------+-----------+----------+--------------+ FV DistalFull                                                        +---------+---------------+---------+-----------+----------+--------------+ PFV      Full                                                        +---------+---------------+---------+-----------+----------+--------------+ POP      Full           Yes      Yes                                 +---------+---------------+---------+-----------+----------+--------------+ PTV                              Yes                                 +---------+---------------+---------+-----------+----------+--------------+   Left Technical Findings: Not visualized segments include limited evaluation PTV, unable to visualize peroneal veins.   Summary: RIGHT: - There is no evidence of deep vein thrombosis in the lower extremity. However, portions of this examination were limited- see technologist comments above.  - No cystic structure found in the popliteal fossa.  LEFT: - There is no evidence of deep vein thrombosis in the lower extremity. However, portions of this examination were limited- see technologist comments above.  - No cystic structure found in the popliteal fossa.  *See table(s) above for measurements and observations. Electronically signed by Deitra Mayo MD on 05/08/2021 at 6:42:23 PM.    Final     Lab Data:  CBC: Recent Labs  Lab 05/07/21 1443 05/07/21 1912 05/08/21 0401 05/09/21 0544 05/10/21 0416 05/11/21 0429  WBC 18.5* 18.3* 19.1* 17.7* 19.8* 19.3*  NEUTROABS 15.3*  --   --   --   --   --   HGB 7.6* 7.2* 8.1* 6.3* 6.2* 6.5*  HCT 23.8* 22.3* 25.9* 20.9* 19.9* 20.6*  MCV 106.7* 107.7* 108.8* 108.3* 103.6* 102.0*  PLT 142* 122* 116* 126* 128* 967*   Basic Metabolic Panel: Recent Labs  Lab 05/07/21 1443 05/07/21 2332 05/08/21 0401 05/09/21 0544 05/10/21 0416 05/11/21 0429  NA 134* 133* 136 134* 134* 137  K 3.8 3.8 3.8 3.4* 3.2* 3.3*  CL 103 103 105 104 103 108  CO2 23 22 23 24 22 24   GLUCOSE 98 102* 97 102* 102* 100*  BUN 13 12 12 11 12 12   CREATININE 1.32* 1.32* 1.35* 1.57* 1.71* 1.68*  CALCIUM 8.6* 8.4* 8.4* 8.0* 8.0* 7.8*  MG 1.8 1.7  --   --   --   --   PHOS  --  3.3  --   --   --   --    GFR: Estimated Creatinine Clearance: 80.3 mL/min (A) (by C-G formula based on SCr of 1.68 mg/dL (H)). Liver Function Tests: Recent Labs  Lab 05/07/21 2332 05/08/21 0401 05/09/21 0544 05/10/21 0416 05/11/21 0429  AST 52* 57* 45* 42* 41  ALT 29 30 33 31 30  ALKPHOS 198* 195* 190* 163* 165*  BILITOT 1.1 1.0 0.6 1.1 1.1  PROT 7.2 6.9 6.8 6.6 6.2*  ALBUMIN 1.8* 1.7* 1.7* 1.6* 1.4*   Recent Labs  Lab 05/07/21 1443  LIPASE 26   Recent Labs  Lab 05/07/21 1618  AMMONIA 30   Coagulation Profile: Recent Labs  Lab 05/07/21 1443  INR 1.2   Cardiac Enzymes: No results for input(s): CKTOTAL, CKMB, CKMBINDEX, TROPONINI in the last 168 hours. BNP (last 3 results) No results for input(s): PROBNP in the last 8760 hours. HbA1C: No results for input(s): HGBA1C in the last 72 hours. CBG: No results for input(s): GLUCAP in the last 168 hours. Lipid Profile: No results for input(s): CHOL, HDL, LDLCALC, TRIG, CHOLHDL, LDLDIRECT in the last 72 hours. Thyroid Function Tests: No results for input(s): TSH, T4TOTAL, FREET4, T3FREE, THYROIDAB in the last 72 hours.  Anemia Panel: No results for input(s): VITAMINB12, FOLATE, FERRITIN, TIBC, IRON, RETICCTPCT in the last 72 hours.  Urine analysis:    Component Value Date/Time   COLORURINE YELLOW 05/07/2021 1702   APPEARANCEUR HAZY (A) 05/07/2021 1702    LABSPEC 1.005 05/07/2021 1702   PHURINE 6.0 05/07/2021 1702   GLUCOSEU NEGATIVE 05/07/2021 1702   HGBUR MODERATE (A) 05/07/2021 1702   BILIRUBINUR NEGATIVE 05/07/2021 1702   KETONESUR 5 (A) 05/07/2021 1702   PROTEINUR NEGATIVE 05/07/2021 1702   UROBILINOGEN 0.2 12/21/2014 1006   NITRITE POSITIVE (A) 05/07/2021 1702   LEUKOCYTESUR LARGE (A) 05/07/2021 1702     Constantin Hillery M.D. [facility] 05/11/2021, 12:47 PM  Available via Epic secure chat 7am-7pm After 7 pm, please refer to night coverage provider listed on amion.

## 2021-05-12 DIAGNOSIS — R7401 Elevation of levels of liver transaminase levels: Secondary | ICD-10-CM

## 2021-05-12 DIAGNOSIS — L03119 Cellulitis of unspecified part of limb: Secondary | ICD-10-CM | POA: Diagnosis not present

## 2021-05-12 DIAGNOSIS — M25552 Pain in left hip: Secondary | ICD-10-CM

## 2021-05-12 DIAGNOSIS — D510 Vitamin B12 deficiency anemia due to intrinsic factor deficiency: Secondary | ICD-10-CM

## 2021-05-12 DIAGNOSIS — F1020 Alcohol dependence, uncomplicated: Secondary | ICD-10-CM

## 2021-05-12 DIAGNOSIS — R7989 Other specified abnormal findings of blood chemistry: Secondary | ICD-10-CM | POA: Diagnosis not present

## 2021-05-12 DIAGNOSIS — B20 Human immunodeficiency virus [HIV] disease: Secondary | ICD-10-CM | POA: Diagnosis not present

## 2021-05-12 DIAGNOSIS — G9341 Metabolic encephalopathy: Secondary | ICD-10-CM | POA: Diagnosis not present

## 2021-05-12 LAB — CULTURE, BLOOD (ROUTINE X 2): Special Requests: ADEQUATE

## 2021-05-12 LAB — CBC WITH DIFFERENTIAL/PLATELET
Abs Immature Granulocytes: 0.35 K/uL — ABNORMAL HIGH (ref 0.00–0.07)
Basophils Absolute: 0.1 K/uL (ref 0.0–0.1)
Basophils Relative: 1 %
Eosinophils Absolute: 0.3 K/uL (ref 0.0–0.5)
Eosinophils Relative: 2 %
HCT: 26.4 % — ABNORMAL LOW (ref 39.0–52.0)
Hemoglobin: 8.2 g/dL — ABNORMAL LOW (ref 13.0–17.0)
Immature Granulocytes: 2 %
Lymphocytes Relative: 13 %
Lymphs Abs: 1.9 K/uL (ref 0.7–4.0)
MCH: 31.8 pg (ref 26.0–34.0)
MCHC: 31.1 g/dL (ref 30.0–36.0)
MCV: 102.3 fL — ABNORMAL HIGH (ref 80.0–100.0)
Monocytes Absolute: 1.2 K/uL — ABNORMAL HIGH (ref 0.1–1.0)
Monocytes Relative: 8 %
Neutro Abs: 10.8 K/uL — ABNORMAL HIGH (ref 1.7–7.7)
Neutrophils Relative %: 74 %
Platelets: 126 K/uL — ABNORMAL LOW (ref 150–400)
RBC: 2.58 MIL/uL — ABNORMAL LOW (ref 4.22–5.81)
RDW: 19.6 % — ABNORMAL HIGH (ref 11.5–15.5)
WBC: 14.5 K/uL — ABNORMAL HIGH (ref 4.0–10.5)
nRBC: 0 % (ref 0.0–0.2)

## 2021-05-12 LAB — TYPE AND SCREEN
ABO/RH(D): O POS
Antibody Screen: NEGATIVE
Unit division: 0
Unit division: 0
Unit division: 0

## 2021-05-12 LAB — BPAM RBC
Blood Product Expiration Date: 202209022359
Blood Product Expiration Date: 202209032359
Blood Product Expiration Date: 202209032359
ISSUE DATE / TIME: 202207311352
ISSUE DATE / TIME: 202208010620
ISSUE DATE / TIME: 202208020808
Unit Type and Rh: 5100
Unit Type and Rh: 5100
Unit Type and Rh: 5100

## 2021-05-12 LAB — HIV-1 RNA QUANT-NO REFLEX-BLD
HIV 1 RNA Quant: <20 copies/mL
LOG10 HIV-1 RNA: UNDETERMINED log10copy/mL

## 2021-05-12 LAB — COMPREHENSIVE METABOLIC PANEL
ALT: 28 U/L (ref 0–44)
AST: 32 U/L (ref 15–41)
Albumin: 1.5 g/dL — ABNORMAL LOW (ref 3.5–5.0)
Alkaline Phosphatase: 192 U/L — ABNORMAL HIGH (ref 38–126)
Anion gap: 8 (ref 5–15)
BUN: 12 mg/dL (ref 6–20)
CO2: 24 mmol/L (ref 22–32)
Calcium: 7.7 mg/dL — ABNORMAL LOW (ref 8.9–10.3)
Chloride: 107 mmol/L (ref 98–111)
Creatinine, Ser: 1.51 mg/dL — ABNORMAL HIGH (ref 0.61–1.24)
GFR, Estimated: >60 mL/min (ref >60)
Glucose, Bld: 110 mg/dL — ABNORMAL HIGH (ref 70–99)
Potassium: 2.9 mmol/L — ABNORMAL LOW (ref 3.5–5.1)
Sodium: 139 mmol/L (ref 135–145)
Total Bilirubin: 0.8 mg/dL (ref 0.3–1.2)
Total Protein: 6.9 g/dL (ref 6.5–8.1)

## 2021-05-12 LAB — PHOSPHORUS: Phosphorus: 2.6 mg/dL (ref 2.5–4.6)

## 2021-05-12 LAB — MAGNESIUM: Magnesium: 1.3 mg/dL — ABNORMAL LOW (ref 1.7–2.4)

## 2021-05-12 LAB — VITAMIN B1: Vitamin B1 (Thiamine): 213.8 nmol/L — ABNORMAL HIGH (ref 66.5–200.0)

## 2021-05-12 LAB — CREATININE, SERUM
Creatinine, Ser: 1.59 mg/dL — ABNORMAL HIGH (ref 0.61–1.24)
GFR, Estimated: 57 mL/min — ABNORMAL LOW (ref >60)

## 2021-05-12 MED ORDER — POTASSIUM CHLORIDE CRYS ER 20 MEQ PO TBCR
40.0000 meq | EXTENDED_RELEASE_TABLET | ORAL | Status: AC
  Administered 2021-05-12 (×2): 40 meq via ORAL
  Filled 2021-05-12 (×2): qty 2

## 2021-05-12 MED ORDER — LORAZEPAM 2 MG/ML IJ SOLN: 0.0000 mg | Freq: Four times a day (QID) | INTRAMUSCULAR | Status: DC

## 2021-05-12 MED ORDER — LORAZEPAM 2 MG/ML IJ SOLN: 0.0000 mg | Freq: Two times a day (BID) | INTRAMUSCULAR | Status: DC

## 2021-05-12 MED ORDER — LORAZEPAM 2 MG/ML IJ SOLN: 1.0000 mg | INTRAMUSCULAR | Status: DC | PRN

## 2021-05-12 MED ORDER — MAGNESIUM SULFATE 4 GM/100ML IV SOLN
4.0000 g | Freq: Once | INTRAVENOUS | Status: AC
  Administered 2021-05-12: 4 g via INTRAVENOUS
  Filled 2021-05-12: qty 100

## 2021-05-12 MED ORDER — FOLIC ACID 1 MG PO TABS: 1.0000 mg | ORAL_TABLET | Freq: Every day | ORAL | Status: DC

## 2021-05-12 MED ORDER — LORAZEPAM 1 MG PO TABS: 1.0000 mg | ORAL_TABLET | ORAL | Status: DC | PRN

## 2021-05-12 MED ORDER — ADULT MULTIVITAMIN W/MINERALS CH: 1.0000 | ORAL_TABLET | Freq: Every day | ORAL | Status: DC

## 2021-05-12 MED ORDER — ALBUTEROL SULFATE (2.5 MG/3ML) 0.083% IN NEBU: 2.5000 mg | INHALATION_SOLUTION | RESPIRATORY_TRACT | Status: DC | PRN

## 2021-05-12 NOTE — Progress Notes (Signed)
Nurse assessed pt's wounds. Nurse observed, cleaned, measured, and dressed wounds.  Pt has a wound  -left anterior thigh, measurements as follows 0.5 x 0.5 x 0.8.  Undermining in all directions - right distal thigh , measurement as follow 3 x 2.3 x 0 - Right posterior hip, measurement as follow 2 x 1.7 x 0.  [name]

## 2021-05-12 NOTE — Progress Notes (Signed)
Subjective:     Patient seen in rounds by Dr. Alvan Dame. Patient was scheduled for outpatient total hip arthroplasty on 05/18/21, but was admitted in the interim for separate medical issues since his last visit to our office.   Objective: Vital signs in last 24 hours: Temp:  [98.3 F (36.8 C)-99.2 F (37.3 C)] 98.6 F (37 C) (08/03 0609) Pulse Rate:  [90-103] 94 (08/03 0823) Resp:  [14-18] 18 (08/03 0823) BP: (99-119)/(52-67) 116/67 (08/03 0609) SpO2:  [94 %-100 %] 95 % (08/03 0823)  Intake/Output from previous day:  Intake/Output Summary (Last 24 hours) at 05/12/2021 0826 Last data filed at 05/12/2021 0500 Gross per 24 hour  Intake 675 ml  Output 1900 ml  Net -1225 ml     Intake/Output this shift: No intake/output data recorded.  Labs: Recent Labs    05/10/21 0416 05/11/21 0429 05/11/21 1056  HGB 6.2* 6.5* 7.5*   Recent Labs    05/10/21 0416 05/11/21 0429 05/11/21 1056  WBC 19.8* 19.3*  --   RBC 1.92* 2.02*  --   HCT 19.9* 20.6* 24.3*  PLT 128* 147*  --    Recent Labs    05/10/21 0416 05/11/21 0429 05/12/21 0453  NA 134* 137  --   K 3.2* 3.3*  --   CL 103 108  --   CO2 22 24  --   BUN 12 12  --   CREATININE 1.71* 1.68* 1.59*  GLUCOSE 102* 100*  --   CALCIUM 8.0* 7.8*  --    No results for input(s): LABPT, INR in the last 72 hours.  Exam: Patient resting in bed upon exam this morning.   Past Medical History:  Diagnosis Date   Bipolar 1 disorder ([place])    Depression    Dizziness and giddiness 02/01/2016   Herpes genitalia    HIV disease ([place])    Hypertension    Migraine headache 02/01/2016   Peripheral neuropathy 10/01/2019   PTSD (post-traumatic stress disorder)    Schizoaffective disorder ([facility])    Seizures ([facility])     Assessment/Plan:    Principal Problem:   Acute metabolic encephalopathy Active Problems:   Schizoaffective disorder, depressive type (HCC)   Severe alcohol dependence (HCC)   GERD (gastroesophageal reflux disease)   Elevated  LFTs   HIV disease (HCC)   Anemia   Cellulitis  Estimated body mass index is 30.06 kg/m as calculated from the following:   Height as of this encounter: 6' 3"  (1.905 m).   Weight as of this encounter: 109.1 kg.   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 He was scheduled for left THA on 05/18/21.   Dr. Alvan Dame discussed with the patient his concerns regarding medical optimization prior to proceeding with surgery. The patient is admitted for several acute & chronic issues which place him at high risk for elective surgery. His hemoglobin is currently 7.5, s/p transfusion. He has also been diagnosed with cellulitis and GPC bacteremia.  After addressing these urgent issues inpatient, I would like the patient to follow up with his PCP/ID team regarding compliance and management of his HIV treatment. He also needs assistance in addressing his alcohol use, as it has resulted in significant safety issues for him recently.   At this point, the patient is at significantly high risk for surgery based on these medical comorbidities, and he will need to be rescheduled until these issues are addressed.   Patient may follow up in our office  as needed.   Griffith Citron, PA-C Orthopedic Surgery [phone] 05/12/2021, 8:26 AM

## 2021-05-12 NOTE — Progress Notes (Signed)
[name] for Infectious Disease  Date of Admission:  05/07/2021           Reason for visit: Follow up on HIV, lower extremity wound  Current antibiotics: Vanco 7/30-pres Ceftriaxone 7/29-pres  ASSESSMENT:    Lower extremity cellulitis with sacral/gluteal/thigh wounds: Evaluated by general surgery, however, no surgical intervention indicated based on CT findings. GPC positive blood cultures HIV Alcohol use disorder Avascular necrosis of the left hip  PLAN:    Continue vancomycin and ceftriaxone as his wounds are presumably polymicrobial Follow-up identification of GPC in blood cultures.  TTE earlier this admission was negative.  Will hold off on TEE pending organism identification. Repeat blood cultures today Continue Biktarvy Follow-up HIV viral load Continue wound care   Principal Problem:   Acute metabolic encephalopathy Active Problems:   Schizoaffective disorder, depressive type (HCC)   Severe alcohol dependence (HCC)   GERD (gastroesophageal reflux disease)   Elevated LFTs   HIV disease (HCC)   Anemia   Cellulitis    MEDICATIONS:    Scheduled Meds: . ALPRAZolam  2 mg Oral TID  . bictegravir-emtricitabine-tenofovir AF  1 tablet Oral Daily  . escitalopram  20 mg Oral Daily  . famotidine  40 mg Oral Daily  . folic acid  1 mg Oral Daily  . Gerhardt's butt cream   Topical BID  . guaiFENesin  1,200 mg Oral BID  . heparin  5,000 Units Subcutaneous Q8H  . LORazepam  0-4 mg Intravenous Q6H   Followed by  . [START ON 05/14/2021] LORazepam  0-4 mg Intravenous Q12H  . lurasidone  120 mg Oral q1800  . multivitamin with minerals  1 tablet Oral Daily  . nicotine  21 mg Transdermal Daily  . oxyCODONE-acetaminophen  1 tablet Oral Once  . pantoprazole  40 mg Oral BID  . thiamine  250 mg Oral Daily  . valACYclovir  1,000 mg Oral Daily  . vancomycin variable dose per unstable renal function (pharmacist dosing)   Does not apply See admin instructions    Continuous Infusions: . cefTRIAXone (ROCEPHIN)  IV 2 g (05/11/21 1813)   PRN Meds:.acetaminophen **OR** acetaminophen, albuterol, benzonatate, LORazepam **OR** LORazepam, trazodone  SUBJECTIVE:   24 hour events:  No acute events were noted overnight Patient is afebrile T-max 99.2 WBC improved HIV viral load is pending GPC in clusters in 2 out of 2 admission blood culture sets (2 of 3 bottles).  No targets isolated on BC ID No new imaging   No new complaints this morning.  No fevers or chills.  Tolerating wound care and antibiotics thus far.  Curious about his lab results that are pending.  Review of Systems  All other systems reviewed and are negative.    OBJECTIVE:   Blood pressure 116/67, pulse 94, temperature 98.6 F (37 C), temperature source Oral, resp. rate 18, height 6' 3"  (1.905 m), weight 109.1 kg, SpO2 95 %. Body mass index is 30.06 kg/m.  Physical Exam Constitutional:      General: He is not in acute distress.    Appearance: Normal appearance.  HENT:     Head: Normocephalic and atraumatic.  Pulmonary:     Effort: Pulmonary effort is normal. No respiratory distress.  Abdominal:     General: There is no distension.     Palpations: Abdomen is soft.     Tenderness: There is no abdominal tenderness.  Neurological:     General: No focal deficit present.     Mental  Status: He is alert and oriented to person, place, and time.  Psychiatric:        Mood and Affect: Mood normal.        Behavior: Behavior normal.     Lab Results: Lab Results  Component Value Date   WBC 14.5 (H) 05/12/2021   HGB 8.2 (L) 05/12/2021   HCT 26.4 (L) 05/12/2021   MCV 102.3 (H) 05/12/2021   PLT 126 (L) 05/12/2021    Lab Results  Component Value Date   NA 139 05/12/2021   K 2.9 (L) 05/12/2021   CO2 24 05/12/2021   GLUCOSE 110 (H) 05/12/2021   BUN 12 05/12/2021   CREATININE 1.51 (H) 05/12/2021   CALCIUM 7.7 (L) 05/12/2021   GFRNONAA >60 05/12/2021   GFRAA 87 11/25/2020     Lab Results  Component Value Date   ALT 28 05/12/2021   AST 32 05/12/2021   ALKPHOS 192 (H) 05/12/2021   BILITOT 0.8 05/12/2021       Component Value Date/Time   CRP 0.8 11/01/2019 0028   CRP 24 (H) 10/01/2019 1700       Component Value Date/Time   ESRSEDRATE 65 (H) 10/01/2019 1700     I have reviewed the micro and lab results in Epic.  Imaging: CT PELVIS W CONTRAST  Result Date: 05/10/2021 CLINICAL DATA:  Buttock and thigh cellulitis, concern for undrained abscess. EXAM: CT PELVIS WITH CONTRAST TECHNIQUE: Multidetector CT imaging of the pelvis was performed using the standard protocol following the bolus administration of intravenous contrast. CONTRAST:  185m OMNIPAQUE IOHEXOL 350 MG/ML SOLN COMPARISON:  CT 09/10/2020 FINDINGS: Urinary Tract: Circumferential thickening of the urinary bladder, greater than expected for underdistention. Hazy perivesicular stranding. Bowel: Some questionable intramural fatty infiltration versus mural thickening of the cecum. No other conspicuous bowel wall thickening or dilatation. No evidence of obstruction within the included margins of imaging. High attenuation contrast media noted within the distal colon and partially opacifying the cecum as well. Streak artifact is noted about the deep pelvis likely related to combination of body habitus and high attenuation contrast media in this vicinity (2/26) Vascular/Lymphatic: Vascular calcifications in the aortic bifurcation included vasculature. Reactive appearing adenopathy in the inguinal chains. Reproductive: The prostate and seminal vesicles are unremarkable. No acute abnormality the included external genitalia. Other: Diffuse edematous changes of the soft tissues most pronounced laterally and along the posterior gluteal soft tissues as well as the posterior thighs and mons pubis. More focal regions of irregular skin thickening seen posteriorly as well as several superficial foci of soft tissue gas in the  left gluteal subcutaneous fat. No deeper extension of the soft tissue gas is seen. Small amount of contrast media seen within the gluteal cleft. Small fat containing umbilical hernia. Question additional regions of injectable use the low anterior abdomen as well. 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 No acute osseous abnormality or suspicious osseous lesion. Musculature appears normal and symmetric. No intramuscular collection or abscess. No worrisome stranding or fluid nor soft tissue gas seen within the fascial planes. IMPRESSION: Extensive soft tissue edema with more irregular skin thickening and subcutaneous fat stranding posteriorly along the gluteal soft tissues and proximal thighs as well as the mons pubis anteriorly. Few foci soft tissue gas noted along the left posterior gluteal tissues and proximal thigh. Recommend correlation with visual inspection for regions of cellulitis and for sites of prior intervention. No organized abscess or collection is evident. Some additional nodular  soft tissue thickening along the low anterior abdominal wall, possibly related to injectable use. Circumferential bladder wall thickening. Correlate with urinalysis to exclude cystitis. Thickening versus intramural fatty infiltration of the cecum, correlate with bowel symptoms. Beam hardening/streak artifact seen across the pelvis related to concentrated contrast media within the distal rectum. Appears artifactual. Should Electronically Signed   By: Lovena Le M.D.   On: 05/10/2021 19:30     Imaging independently reviewed in Epic.    Raynelle Highland for Infectious Disease [facility] [phone] pager 05/12/2021, 11:33 AM

## 2021-05-12 NOTE — Progress Notes (Signed)
Speech Language Pathology Treatment: Dysphagia  Patient Details Name: [name] MRN: [id number] DOB: 22-Apr-1986 Today's Date: 05/12/2021 Time: 6270-3500 SLP Time Calculation (min) (ACUTE ONLY): 40 min  Assessment / Plan / Recommendation Clinical Impression  Pt seen for follow up after MBS study the prior date - He was observed slid down in bed nearly flat with drink spilled on his gown.  Wet vocal quality noted concerning for laryngeal infiltration of secretions and/or po.  Pt also observed to take water via straw with HOB reclined resulting in overt cough post-swallow.    Pt cough after swallow of thin per 2 prior MBS studies were indicative of aspiration without full ability to clear on most recent study.  SLP obtained assistance to slide pt up in bed for optimal positioning for safer po intake.   Pt continued with cough post-swallow of thin water despite straw use and upright position.   He admits coughing causes discomfort - and used his [name] to clear secretions.  Fortunately pt's CXR was negative during this hospital coarse - and last pna per [facility] was 10/2019 however given his acute illness, weak cough, compromised immune system and absent mobility- he is an elevated aspiration pna risk.    SLP offered pt po trials of nectar thick - and liquid between nectar and thin - No indications of aspiration across all cup boluses consumed of these consistencies *approximately 7 boluses.     Pt reports improved comfort with the 1/2 nectar - therefore will modify liquid consistency to between nectar/thin.    Will follow up for RMT to improve cough strength/airway protection as pt admits his voice/cough strength are weaker with current acute illness than baseline.  He is able to produce only a few breathy words per breath group and advised he would commit to RMT.    Pt is making progress in understanding his dysphagia and given his poor positioning in bed - weakness, recommend to  modify his liquids to maximize intake/hydration safely as possible.   Informed NT to recommendations and demonstrated use of thickener for 1/2 nectar thickness. Posted signs with precautions and provided pt with copy using teach back for reinforcement.    HPI HPI: Patient is a 35 y.o. male with PMH: PTSD, HIV, schizoaffective disorder, seizures, heavy alcohol abuse, HTN. He was brought to ED via EMS from home for AMS. Patient's family called EMS because patient was not taking his medications and has been laying on the couch for past 2 weeks covered in feces and urine, surrounded by multiple alcohol bottles. In addition, he was noted to have increased LE swelling and sores on feet.  Patient was evaluated by SLP for swallow function in January of 2021 and MBS was completed which showed aspiration of thin liquids with larger sips, but without penetration or aspiration when taking smaller more controlled sips.      SLP Plan  Continue with current plan of care       Recommendations  Diet recommendations: Regular (1/2 nectar thick) Liquids provided via: Cup;No straw Medication Administration: Whole meds with puree Supervision: Patient able to self feed Compensations: Slow rate;Small sips/bites;Minimize environmental distractions Postural Changes and/or Swallow Maneuvers: Seated upright 90 degrees;Upright 30-60 min after meal                Follow up Recommendations: None SLP Visit Diagnosis: Dysphagia, oropharyngeal phase (R13.12) Plan: Continue with current plan of care       GO  Kathleen Lime, MS [facility] SLP Acute Rehab Services Office [phone] Pager [phone]   [name] 05/12/2021, 5:30 PM

## 2021-05-12 NOTE — Plan of Care (Signed)
°  Problem: Clinical Measurements: °Goal: Respiratory complications will improve °Outcome: Progressing °Goal: Cardiovascular complication will be avoided °Outcome: Progressing °  °Problem: Activity: °Goal: Risk for activity intolerance will decrease °Outcome: Progressing °  °

## 2021-05-12 NOTE — Progress Notes (Signed)
PROGRESS NOTE    [name]  YBO:[id number] DOB: 1986-06-05 DOA: 05/07/2021 PCP: [name], MD   Chief Complaint  Patient presents with   Leg Swelling    Brief Narrative: Patient is a 35 year old male with history of HIV, PTSD, schizoaffective disorder, seizures, heavy alcohol abuse, hypertension was brought to ED via EMS for altered mental status.  I was unable to obtain any history from the patient due to his mental status (whispering and rambling).  He was able to tell me his name.  Per EMS and EDP records, patient lives alone.  Patient's family called EMS because he was not taking his medications.  He was laying on the couch for the last 2 weeks, covered in feces and urine.  Patient was surrounded by many alcohol bottles.  He was also noted to have increased lower extremity swelling, sores and redness to the feet.   Interval summary 7/30-8/2   Patient was admitted with altered mental status, found on the couch covered in feces and urine, alcohol bottles Received high-dose IV thiamine for 3 days, completed. Patient has extensive cellulitis and wounds of the bilateral buttocks, posterior thighs with purulent and bloody drainage.  Placed on IV vancomycin and Rocephin. General surgery was consulted, CT scans showed drainable abscess.   Blood cultures positive for GPC in clusters, continue current IV antibiotics.  ID consulted   Plan to pursue LTAC for wound care, IV antibiotics.   Assessment & Plan:   Principal Problem:   Acute metabolic encephalopathy Active Problems:   Schizoaffective disorder, depressive type (HCC)   Severe alcohol dependence (HCC)   GERD (gastroesophageal reflux disease)   Elevated LFTs   HIV disease (HCC)   Anemia   Cellulitis   #1 acute metabolic encephalopathy -Questionable etiology. -Could possibly be secondary to Warnicke's encephalopathy/Korsakoff syndrome versus hepatic encephalopathy versus secondary to infectious etiology/cellulitis.   Patient noted to have underlying psychiatric history. -CT head done negative for any acute abnormalities except increase fluid in the right ethmoid air cells. -Ammonia level at 30, TSH 1.1, B1 pending.,  Vitamin B12 at 2821. -Patient noted to have completed high-dose thiamine 500 mg 3 times daily x3 days, currently on thiamine 250 mg daily. -Mental status slowly improving. -Continue current dysphagia diet as recommended by SLP. -Patient was being followed by psychiatry who have signed off.  2.  Cellulitis bilateral lower extremity wounds, sacral/gluteal/thigh wound, POA -Patient noted to have some lower extremity edema felt likely secondary to severe hypoalbuminemia and third spacing. -2D echo with normal EF of 60 to 27%, grade 1 diastolic dysfunction. -Lower extremity Dopplers negative for DVT. -Continue empiric IV vancomycin and IV Rocephin. -General surgery was following however signed off but they state no drainable abscess noted. -ID following and appreciate input and recommendations.  3.  GPC bacteremia -1/3 blood cultures positive for GPC in clusters. -On IV vancomycin. -ID following.  4.  Acute on chronic anemia/microcytic anemia/thrombocytopenia -Likely alcohol induced. -FOBT negative 05/07/2021 -Anemia panel consistent with anemia of chronic disease. -Patient noted to have some bleeding from wound on posterior thighs, gluteal and sacral area. -Status post transfusion 3 units packed red blood cells . -Hemoglobin currently at 8.2 -Transfusion threshold hemoglobin < 7.   5. transaminitis with elevated alk phosphatase, AST, thrombocytopenia -Likely alcohol induced. -Abdominal ultrasound negative for cholelithiasis or biliary duct dilatation however consistent with hepatic steatosis. -LFTs improving. -Outpatient follow-up.  6.  Acute kidney injury -Likely secondary to prerenal azotemia in the setting of poor oral intake and  intravascular depletion. -Renal function improved  with hydration. -IV fluids saline lock.  7.  Hypokalemia/hypomagnesemia -Magnesium sulfate 4 g IV x1. -K-Dur 40 mEq p.o. x2. -Repeat labs in the morning.  8.  History of alcohol abuse -Continue thiamine, folic acid. -Status post high-dose thiamine. -Place on Ativan withdrawal protocol.  9.  HIV disease -Patient noted to be noncompliant with antiretrovirals. -Continue Biktarvy, valacyclovir. -ID following.  10.  Schizoaffective disorder, depressive disorder -Continue current regimen of Xanax, Lexapro, Latuda, -Psychiatry was following but have signed off. -Outpatient follow-up with psychiatry.  11.  GERD -PPI.  12.  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 -Plain films of the left hip showed chronic and absent fracture of left femoral head similar to prior exam. -Patient noted to have been scheduled for surgery on 05/18/2021 with orthopedics, Dr. Ihor Gully for total hip arthroplasty. -Patient has been seen in consultation by orthopedics who feel patient is at significant high risk for surgery based on medical comorbidities and now recommending rescheduling surgery once issues have been addressed. -Outpatient follow-up with orthopedics.   DVT prophylaxis: Heparin Code Status: Full Family Communication: No family at bedside. Disposition:   Status is: Inpatient  Remains inpatient appropriate because:IV treatments appropriate due to intensity of illness or inability to take PO  Dispo: The patient is from: LTAC versus SNF              Anticipated d/c is to: Home              Patient currently is not medically stable to d/c.   Difficult to place patient No       Consultants:  Psychiatry: Dr. Dwyane Dee 05/10/2021 Wound care RN 05/10/2021 General surgery: Dr. Redmond Pulling 05/11/2021 Infectious disease: Dr. Juleen China 05/11/2021  Orthopedics: Costella Hatcher, PA  05/12/2021  Procedures:  CT pelvis 05/10/2021 CT head 05/07/2021 Plain films of the pelvis and left hip 05/08/2021 Chest x-ray 05/07/2021 Abdominal ultrasound 05/07/2021 2D echo 05/08/2021 Lower extremity Doppler 05/09/2019  Antimicrobials:  IV Rocephin 05/07/2021>>>>> 05/14/2021 IV vancomycin 05/08/2021>>>>>>>   Subjective: States had a bout of nausea and emesis early on this morning.  Noted however to have cleaned his lunch tray.  Denies any chest pain or shortness of breath.  Denies any abdominal pain.  Objective: Vitals:   05/12/21 0211 05/12/21 0609 05/12/21 0823 05/12/21 1329  BP: 111/60 116/67  (!) 103/58  Pulse: 90 95 94 (!) 104  Resp: 18 16 18 20   Temp: 98.9 F (37.2 C) 98.6 F (37 C)  98.9 F (37.2 C)  TempSrc: Oral Oral  Oral  SpO2: 94% 98% 95% 96%  Weight:      Height:        Intake/Output Summary (Last 24 hours) at 05/12/2021 1336 Last data filed at 05/12/2021 0900 Gross per 24 hour  Intake 360 ml  Output 2500 ml  Net -2140 ml   Filed Weights   05/08/21 1300  Weight: 109.1 kg    Examination:  General exam: Appears calm and comfortable  Respiratory system: Coarse breath sounds anterior lung fields.  No wheezing.  No crackles.  Fair air movement.  Speaking in full sentences.  Cardiovascular system: Regular rate rhythm no murmurs rubs or gallops.  No JVD.  No lower extremity edema.  Gastrointestinal system: Abdomen is nondistended, soft and some lower abdominal discomfort. No organomegaly or masses felt. Normal bowel sounds heard.  Central nervous system: Alert. No focal neurological deficits. Extremities: Dressing noted on bilateral outer thighs.  Symmetric 5 x 5 power. Skin: No rashes, lesions or ulcers Psychiatry: Judgement and insight appear poor to fair.  Mood & affect appropriate.     Data Reviewed: I have personally reviewed following labs and imaging studies  CBC: Recent Labs  Lab 05/07/21 1443 05/07/21 1912 05/08/21 0401 05/09/21 0544 05/10/21 0416  05/11/21 0429 05/11/21 1056 05/12/21 1019  WBC 18.5*   < > 19.1* 17.7* 19.8* 19.3*  --  14.5*  NEUTROABS 15.3*  --   --   --   --   --   --  10.8*  HGB 7.6*   < > 8.1* 6.3* 6.2* 6.5* 7.5* 8.2*  HCT 23.8*   < > 25.9* 20.9* 19.9* 20.6* 24.3* 26.4*  MCV 106.7*   < > 108.8* 108.3* 103.6* 102.0*  --  102.3*  PLT 142*   < > 116* 126* 128* 147*  --  126*   < > = values in this interval not displayed.    Basic Metabolic Panel: Recent Labs  Lab 05/07/21 1443 05/07/21 2332 05/08/21 0401 05/09/21 0544 05/10/21 0416 05/11/21 0429 05/12/21 0453 05/12/21 1019  NA 134* 133* 136 134* 134* 137  --  139  K 3.8 3.8 3.8 3.4* 3.2* 3.3*  --  2.9*  CL 103 103 105 104 103 108  --  107  CO2 23 22 23 24 22 24   --  24  GLUCOSE 98 102* 97 102* 102* 100*  --  110*  BUN 13 12 12 11 12 12   --  12  CREATININE 1.32* 1.32* 1.35* 1.57* 1.71* 1.68* 1.59* 1.51*  CALCIUM 8.6* 8.4* 8.4* 8.0* 8.0* 7.8*  --  7.7*  MG 1.8 1.7  --   --   --   --   --  1.3*  PHOS  --  3.3  --   --   --   --   --  2.6    GFR: Estimated Creatinine Clearance: 89.3 mL/min (A) (by C-G formula based on SCr of 1.51 mg/dL (H)).  Liver Function Tests: Recent Labs  Lab 05/08/21 0401 05/09/21 0544 05/10/21 0416 05/11/21 0429 05/12/21 1019  AST 57* 45* 42* 41 32  ALT 30 33 31 30 28   ALKPHOS 195* 190* 163* 165* 192*  BILITOT 1.0 0.6 1.1 1.1 0.8  PROT 6.9 6.8 6.6 6.2* 6.9  ALBUMIN 1.7* 1.7* 1.6* 1.4* 1.5*    CBG: No results for input(s): GLUCAP in the last 168 hours.   Recent Results (from the past 240 hour(s))  Resp Panel by RT-PCR (Flu A&B, Covid) Nasopharyngeal Swab     Status: None   Collection Time: 05/07/21  2:48 PM   Specimen: Nasopharyngeal Swab; Nasopharyngeal(NP) swabs in vial transport medium  Result Value Ref Range Status   SARS Coronavirus 2 by RT PCR NEGATIVE NEGATIVE Final    Comment: (NOTE) SARS-CoV-2 target nucleic acids are NOT DETECTED.  The SARS-CoV-2 RNA is generally detectable in upper  respiratory specimens during the acute phase of infection. The lowest concentration of SARS-CoV-2 viral copies this assay can detect is 138 copies/mL. A negative result does not preclude SARS-Cov-2 infection and should not be used as the sole basis for treatment or other patient management decisions. A negative result may occur with  improper specimen collection/handling, submission of specimen other than nasopharyngeal swab, presence of viral mutation(s) within the areas targeted by this assay, and inadequate number of viral  copies(<138 copies/mL). A negative result must be combined with clinical observations, patient history, and epidemiological information. The expected result is Negative.  Fact Sheet for Patients:  EntrepreneurPulse.com.au  Fact Sheet for Healthcare Providers:  IncredibleEmployment.be  This test is no t yet approved or cleared by the [place] FDA and  has been authorized for detection and/or diagnosis of SARS-CoV-2 by FDA under an Emergency Use Authorization (EUA). This EUA will remain  in effect (meaning this test can be used) for the duration of the COVID-19 declaration under Section 564(b)(1) of the Act, 21 U.S.C.section 360bbb-3(b)(1), unless the authorization is terminated  or revoked sooner.       Influenza A by PCR NEGATIVE NEGATIVE Final   Influenza B by PCR NEGATIVE NEGATIVE Final    Comment: (NOTE) The Xpert Xpress SARS-CoV-2/FLU/RSV plus assay is intended as an aid in the diagnosis of influenza from Nasopharyngeal swab specimens and should not be used as a sole basis for treatment. Nasal washings and aspirates are unacceptable for Xpert Xpress SARS-CoV-2/FLU/RSV testing.  Fact Sheet for Patients: EntrepreneurPulse.com.au  Fact Sheet for Healthcare Providers: IncredibleEmployment.be  This test is not yet approved or cleared by the [place] FDA and has been  authorized for detection and/or diagnosis of SARS-CoV-2 by FDA under an Emergency Use Authorization (EUA). This EUA will remain in effect (meaning this test can be used) for the duration of the COVID-19 declaration under Section 564(b)(1) of the Act, 21 U.S.C. section 360bbb-3(b)(1), unless the authorization is terminated or revoked.  Performed at [facility], [place] [address] [place], [place] [address]   Urine Culture     Status: Abnormal   Collection Time: 05/07/21  5:02 PM   Specimen: Urine, Clean Catch  Result Value Ref Range Status   Specimen Description   Final    URINE, CLEAN CATCH Performed at [facility], [place] [address] [place], [place] [address]    Special Requests   Final    NONE Performed at [facility], [place] [address], [place], [place] [address]    Culture MULTIPLE SPECIES PRESENT, SUGGEST RECOLLECTION (A)  Final   Report Status 05/09/2021 FINAL  Final  Culture, blood (Routine X 2) w Reflex to ID Panel     Status: Abnormal   Collection Time: 05/07/21  5:03 PM   Specimen: BLOOD  Result Value Ref Range Status   Specimen Description   Final    BLOOD LEFT ANTECUBITAL Performed at [name] [address], [place], [place] [address]    Special Requests   Final    BOTTLES DRAWN AEROBIC AND ANAEROBIC Blood Culture adequate volume Performed at [facility] [address] [place], [place] [address]    Culture  Setup Time   Final    GRAM POSITIVE COCCI IN CLUSTERS AEROBIC BOTTLE ONLY CRITICAL VALUE NOTED.  VALUE IS CONSISTENT WITH PREVIOUSLY REPORTED AND CALLED VALUE.    Culture (A)  Final    MICROCOCCUS SPECIES Standardized susceptibility testing for this organism is not available. Performed at [facility], [place] [address], [place], [place] [address]    Report Status 05/12/2021 FINAL  Final  Culture, blood (Routine X 2) w Reflex to ID Panel     Status:  Abnormal   Collection Time: 05/07/21  5:08 PM   Specimen: BLOOD  Result Value Ref Range Status   Specimen Description   Final    BLOOD BLOOD LEFT FOREARM Performed at [facility], [place] [address] [place], [place] [address]    Special Requests   Final    BOTTLES DRAWN AEROBIC ONLY Blood Culture results may not be optimal due to an inadequate volume of blood received in culture bottles Performed at [facility] [address] [place], [place] [address]    Culture  Setup Time   Final    GRAM POSITIVE COCCI IN CLUSTERS AEROBIC BOTTLE ONLY CRITICAL RESULT CALLED TO, READ BACK BY AND VERIFIED WITH: PHARM D J.GADHIA ON [id number] AT 0957 BY E.PARRISH    Culture (A)  Final    MICROCOCCUS SPECIES Standardized susceptibility testing for this organism is not available. Performed at [facility], [place] [address] [place], [place] [address]    Report Status 05/12/2021 FINAL  Final  Blood Culture ID Panel (Reflexed)     Status: None   Collection Time: 05/07/21  5:08 PM  Result Value Ref Range Status   Enterococcus faecalis NOT DETECTED NOT DETECTED Final   Enterococcus Faecium NOT DETECTED NOT DETECTED Final   Listeria monocytogenes NOT DETECTED NOT DETECTED Final   Staphylococcus species NOT DETECTED NOT DETECTED Final   Staphylococcus aureus (BCID) NOT DETECTED NOT DETECTED Final   Staphylococcus epidermidis NOT DETECTED NOT DETECTED Final   Staphylococcus lugdunensis NOT DETECTED NOT DETECTED Final   Streptococcus species NOT DETECTED NOT DETECTED Final   Streptococcus agalactiae NOT DETECTED NOT DETECTED Final   Streptococcus pneumoniae NOT DETECTED NOT DETECTED Final   Streptococcus pyogenes NOT DETECTED NOT DETECTED Final   A.calcoaceticus-baumannii NOT DETECTED NOT DETECTED Final   Bacteroides fragilis NOT DETECTED NOT DETECTED Final   Enterobacterales NOT DETECTED NOT DETECTED Final   Enterobacter cloacae complex NOT DETECTED NOT DETECTED  Final   Escherichia coli NOT DETECTED NOT DETECTED Final   Klebsiella aerogenes NOT DETECTED NOT DETECTED Final   Klebsiella oxytoca NOT DETECTED NOT DETECTED Final   Klebsiella pneumoniae NOT DETECTED NOT DETECTED Final   Proteus species NOT DETECTED NOT DETECTED Final   Salmonella species NOT DETECTED NOT DETECTED Final   Serratia marcescens NOT DETECTED NOT DETECTED Final   Haemophilus influenzae NOT DETECTED NOT DETECTED Final   Neisseria meningitidis NOT DETECTED NOT DETECTED Final   Pseudomonas aeruginosa NOT DETECTED NOT DETECTED Final   Stenotrophomonas maltophilia NOT DETECTED NOT DETECTED Final   Candida albicans NOT DETECTED NOT DETECTED Final   Candida auris NOT DETECTED NOT DETECTED Final   Candida glabrata NOT DETECTED NOT DETECTED Final   Candida krusei NOT DETECTED NOT DETECTED Final   Candida parapsilosis NOT DETECTED NOT DETECTED Final   Candida tropicalis NOT DETECTED NOT DETECTED Final   Cryptococcus neoformans/gattii NOT DETECTED NOT DETECTED Final    Comment: Performed at [facility], [address], [place], [place] [address]         Radiology Studies: CT PELVIS W CONTRAST  Result Date: 05/10/2021 CLINICAL DATA:  Buttock and thigh cellulitis, concern for undrained abscess. EXAM: CT PELVIS WITH CONTRAST TECHNIQUE: Multidetector CT imaging of the pelvis was performed using the standard protocol following the bolus administration of intravenous contrast. CONTRAST:  139m OMNIPAQUE IOHEXOL 350 MG/ML SOLN COMPARISON:  CT 09/10/2020 FINDINGS: Urinary Tract: Circumferential thickening of the urinary bladder, greater than expected for underdistention. Hazy perivesicular stranding. Bowel: Some questionable intramural fatty infiltration versus mural thickening of the cecum. No other conspicuous bowel wall thickening or dilatation. No evidence of obstruction within the included margins of imaging. High attenuation contrast media noted within the distal colon  and  partially opacifying the cecum as well. Streak artifact is noted about the deep pelvis likely related to combination of body habitus and high attenuation contrast media in this vicinity (2/26) Vascular/Lymphatic: Vascular calcifications in the aortic bifurcation included vasculature. Reactive appearing adenopathy in the inguinal chains. Reproductive: The prostate and seminal vesicles are unremarkable. No acute abnormality the included external genitalia. Other: Diffuse edematous changes of the soft tissues most pronounced laterally and along the posterior gluteal soft tissues as well as the posterior thighs and mons pubis. More focal regions of irregular skin thickening seen posteriorly as well as several superficial foci of soft tissue gas in the left gluteal subcutaneous fat. No deeper extension of the soft tissue gas is seen. Small amount of contrast media seen within the gluteal cleft. Small fat containing umbilical hernia. Question additional regions of injectable use the low anterior abdomen as well. 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 No acute osseous abnormality or suspicious osseous lesion. Musculature appears normal and symmetric. No intramuscular collection or abscess. No worrisome stranding or fluid nor soft tissue gas seen within the fascial planes. IMPRESSION: Extensive soft tissue edema with more irregular skin thickening and subcutaneous fat stranding posteriorly along the gluteal soft tissues and proximal thighs as well as the mons pubis anteriorly. Few foci soft tissue gas noted along the left posterior gluteal tissues and proximal thigh. Recommend correlation with visual inspection for regions of cellulitis and for sites of prior intervention. No organized abscess or collection is evident. Some additional nodular soft tissue thickening along the low anterior abdominal wall, possibly related to injectable use.  Circumferential bladder wall thickening. Correlate with urinalysis to exclude cystitis. Thickening versus intramural fatty infiltration of the cecum, correlate with bowel symptoms. Beam hardening/streak artifact seen across the pelvis related to concentrated contrast media within the distal rectum. Appears artifactual. Should Electronically Signed   By: Lovena Le M.D.   On: 05/10/2021 19:30        Scheduled Meds:  ALPRAZolam  2 mg Oral TID   bictegravir-emtricitabine-tenofovir AF  1 tablet Oral Daily   escitalopram  20 mg Oral Daily   famotidine  40 mg Oral Daily   folic acid  1 mg Oral Daily   Gerhardt's butt cream   Topical BID   guaiFENesin  1,200 mg Oral BID   heparin  5,000 Units Subcutaneous Q8H   LORazepam  0-4 mg Intravenous Q6H   Followed by   Derrill Memo ON 05/14/2021] LORazepam  0-4 mg Intravenous Q12H   lurasidone  120 mg Oral q1800   multivitamin with minerals  1 tablet Oral Daily   nicotine  21 mg Transdermal Daily   oxyCODONE-acetaminophen  1 tablet Oral Once   pantoprazole  40 mg Oral BID   potassium chloride  40 mEq Oral Q4H   thiamine  250 mg Oral Daily   valACYclovir  1,000 mg Oral Daily   vancomycin variable dose per unstable renal function (pharmacist dosing)   Does not apply See admin instructions   Continuous Infusions:  cefTRIAXone (ROCEPHIN)  IV 2 g (05/11/21 1813)   magnesium sulfate bolus IVPB       LOS: 5 days    Time spent: 40 minutes    Irine Seal, MD [facility]   To contact the attending provider between 7A-7P or the covering provider during after hours 7P-7A, please log into the web site [email or web address] and access using universal [place] password for that web site.  If you do not have the password, please call the hospital operator.  05/12/2021, 1:36 PM

## 2021-05-12 NOTE — Care Management Important Message (Signed)
Important Message  Patient Details IM Letter given to the Patient. Name: [name] MRN: [id number] Date of Birth: 05-Jun-1986   Medicare Important Message Given:  Yes     Kerin Salen 05/12/2021, 12:16 PM

## 2021-05-13 DIAGNOSIS — R7989 Other specified abnormal findings of blood chemistry: Secondary | ICD-10-CM | POA: Diagnosis not present

## 2021-05-13 DIAGNOSIS — B20 Human immunodeficiency virus [HIV] disease: Secondary | ICD-10-CM | POA: Diagnosis not present

## 2021-05-13 DIAGNOSIS — M25552 Pain in left hip: Secondary | ICD-10-CM | POA: Diagnosis not present

## 2021-05-13 DIAGNOSIS — G9341 Metabolic encephalopathy: Secondary | ICD-10-CM | POA: Diagnosis not present

## 2021-05-13 DIAGNOSIS — L03119 Cellulitis of unspecified part of limb: Secondary | ICD-10-CM | POA: Diagnosis not present

## 2021-05-13 DIAGNOSIS — D510 Vitamin B12 deficiency anemia due to intrinsic factor deficiency: Secondary | ICD-10-CM | POA: Diagnosis not present

## 2021-05-13 LAB — BASIC METABOLIC PANEL
Anion gap: 4 — ABNORMAL LOW (ref 5–15)
BUN: 11 mg/dL (ref 6–20)
CO2: 24 mmol/L (ref 22–32)
Calcium: 7.9 mg/dL — ABNORMAL LOW (ref 8.9–10.3)
Chloride: 111 mmol/L (ref 98–111)
Creatinine, Ser: 1.28 mg/dL — ABNORMAL HIGH (ref 0.61–1.24)
GFR, Estimated: >60 mL/min (ref >60)
Glucose, Bld: 88 mg/dL (ref 70–99)
Potassium: 3.9 mmol/L (ref 3.5–5.1)
Sodium: 139 mmol/L (ref 135–145)

## 2021-05-13 LAB — CBC WITH DIFFERENTIAL/PLATELET
Abs Immature Granulocytes: 0.35 10*3/uL — ABNORMAL HIGH (ref 0.00–0.07)
Basophils Absolute: 0.1 10*3/uL (ref 0.0–0.1)
Basophils Relative: 1 %
Eosinophils Absolute: 0.2 10*3/uL (ref 0.0–0.5)
Eosinophils Relative: 2 %
HCT: 26.9 % — ABNORMAL LOW (ref 39.0–52.0)
Hemoglobin: 8.3 g/dL — ABNORMAL LOW (ref 13.0–17.0)
Immature Granulocytes: 2 %
Lymphocytes Relative: 15 %
Lymphs Abs: 2.3 10*3/uL (ref 0.7–4.0)
MCH: 32 pg (ref 26.0–34.0)
MCHC: 30.9 g/dL (ref 30.0–36.0)
MCV: 103.9 fL — ABNORMAL HIGH (ref 80.0–100.0)
Monocytes Absolute: 1.5 10*3/uL — ABNORMAL HIGH (ref 0.1–1.0)
Monocytes Relative: 10 %
Neutro Abs: 10.8 10*3/uL — ABNORMAL HIGH (ref 1.7–7.7)
Neutrophils Relative %: 70 %
Platelets: 141 10*3/uL — ABNORMAL LOW (ref 150–400)
RBC: 2.59 MIL/uL — ABNORMAL LOW (ref 4.22–5.81)
RDW: 19.4 % — ABNORMAL HIGH (ref 11.5–15.5)
WBC: 15.3 10*3/uL — ABNORMAL HIGH (ref 4.0–10.5)
nRBC: 0 % (ref 0.0–0.2)

## 2021-05-13 LAB — MAGNESIUM: Magnesium: 2 mg/dL (ref 1.7–2.4)

## 2021-05-13 MED ORDER — LOPERAMIDE HCL 1 MG/7.5ML PO SUSP
1.0000 mg | ORAL | Status: DC | PRN
  Administered 2021-05-15: 1 mg via ORAL
  Filled 2021-05-13 (×2): qty 7.5

## 2021-05-13 MED ORDER — RISAQUAD PO CAPS
2.0000 | ORAL_CAPSULE | Freq: Three times a day (TID) | ORAL | Status: AC
  Administered 2021-05-14: 2 via ORAL
  Filled 2021-05-13: qty 2

## 2021-05-13 MED ORDER — SODIUM CHLORIDE 0.9 % IV SOLN
3.0000 g | Freq: Four times a day (QID) | INTRAVENOUS | Status: DC
  Administered 2021-05-13 – 2021-05-14 (×5): 3 g via INTRAVENOUS
  Filled 2021-05-13 (×3): qty 3
  Filled 2021-05-13: qty 8
  Filled 2021-05-13 (×2): qty 3

## 2021-05-13 NOTE — Progress Notes (Signed)
  Speech Language Pathology Treatment: Dysphagia  Patient Details Name: COLSEN MODI MRN: [id number] DOB: 1986-08-16 Today's Date: 05/13/2021 Time: [id number] SLP Time Calculation (min) (ACUTE ONLY): 35 min  Assessment / Plan / Recommendation Clinical Impression  Pt asleep but awoke with moderate verbal/tactile stimulation.  Purpose of visit was to intiate RMT for cough/hyolaryngeal elevator strength.  Pt willingly consumed Gingerale via cup *between nectar and thin consistencies*.  He tolerated 6/7 swallows - with immediate cough x1 post-swallow.  Pt's aspirated gingerale was slightly thinner and had ice in it,  rectified it with pt tolerating further intake.  Pt was positioned fully upright to consume intake with assist from [place] and he was propped with pillow to maintain more erect posture.    Strong cough and expectoration of viscous secretions noted x1 before intake- which pt was able to clear with oral suction.   Intermittent wet voice also observed with pt needing moderate verbal cues to cough and expectorate to clear - using teach back, explained reasoning to pt for precautions .  Based on said findings, he is likely having episodic aspiration of even secretions therefore reiterated importance of oral care.   Using teach back, pt reiterated purpose of compensations.    Pt declined to participate in RMT today but advised he would "try" tomorrow.    SLP informed NT and RN of recommendations.  Also recommend to consider PT consult if pt is a candidate as pt reports he wants to "get up" and ortho states he is not a candidate for surgery currently.      HPI HPI: Patient is a 35 y.o. male with PMH: PTSD, HIV, schizoaffective disorder, seizures, heavy alcohol abuse, HTN. He was brought to ED via EMS from home for AMS. Patient's family called EMS because patient was not taking his medications and has been laying on the couch for past 2 weeks covered in feces and urine, surrounded by  multiple alcohol bottles. In addition, he was noted to have increased LE swelling and sores on feet.  Patient was evaluated by SLP for swallow function in January of 2021 and MBS was completed which showed aspiration of thin liquids with larger sips, but without penetration or aspiration when taking smaller more controlled sips.      SLP Plan  Continue with current plan of care       Recommendations  Diet recommendations: Regular;Thin liquid Liquids provided via: Cup;No straw Medication Administration: Whole meds with puree Supervision: Patient able to self feed Compensations: Slow rate;Small sips/bites;Minimize environmental distractions Postural Changes and/or Swallow Maneuvers: Seated upright 90 degrees;Upright 30-60 min after meal                Oral Care Recommendations: Oral care BID Follow up Recommendations: Skilled Nursing facility SLP Visit Diagnosis: Dysphagia, oropharyngeal phase (R13.12) Plan: Continue with current plan of care       GO              Kathleen Lime, MS [facility] Office [phone] Pager [phone]   [name] 05/13/2021, 5:31 PM

## 2021-05-13 NOTE — Progress Notes (Signed)
[facility]  Date of Admission:  05/07/2021           Reason for visit: Follow up on HIV, lower extremity wounds  Current antibiotics: Vanco 7/30-pres Ceftriaxone 7/29-pres   ASSESSMENT:    Lower extremity sacral/gluteal/thigh wounds: Previously evaluated by general surgery, however, no surgical intervention indicated based on imaging findings.  He seems to be clinically improving on broad-spectrum antibiotics.  Wound care nursing assessment of patient's wounds noted this morning. Micrococcus positive blood cultures: Unclear significance as often times these are considered normal skin or mouth flora.  Interestingly, there is a micrococcus species that has been associated with beer and patient was noted to be found with multiple empty beer bottles in his home prior to admission.  Question whether these cultures obtained through the ED might be contaminated with this.  Nonetheless, his TTE from earlier this admission was negative and he is clinically improving with repeat blood cultures no growth. HIV: CD4 count healthy and viral load undetectable. Alcohol use disorder Avascular necrosis of the left hip  PLAN:    Will narrow antibiotics today to Unasyn If repeat blood cultures are no growth at 48 to 72 hours likely can transition to Augmentin to complete course of therapy for skin and soft tissue infection and possible uncomplicated bacteremia Continue Biktarvy Continue wound care Will follow   Principal Problem:   Acute metabolic encephalopathy Active Problems:   Schizoaffective disorder, depressive type (HCC)   Severe alcohol dependence (HCC)   GERD (gastroesophageal reflux disease)   Elevated LFTs   HIV disease (HCC)   Anemia   Cellulitis   Left hip pain   Transaminitis    MEDICATIONS:    Scheduled Meds: . ALPRAZolam  2 mg Oral TID  . bictegravir-emtricitabine-tenofovir AF  1 tablet Oral Daily  . escitalopram  20 mg Oral Daily  .  famotidine  40 mg Oral Daily  . folic acid  1 mg Oral Daily  . Gerhardt's butt cream   Topical BID  . guaiFENesin  1,200 mg Oral BID  . heparin  5,000 Units Subcutaneous Q8H  . LORazepam  0-4 mg Intravenous Q6H   Followed by  . [START ON 05/14/2021] LORazepam  0-4 mg Intravenous Q12H  . lurasidone  120 mg Oral q1800  . multivitamin with minerals  1 tablet Oral Daily  . nicotine  21 mg Transdermal Daily  . oxyCODONE-acetaminophen  1 tablet Oral Once  . pantoprazole  40 mg Oral BID  . thiamine  250 mg Oral Daily  . valACYclovir  1,000 mg Oral Daily   Continuous Infusions: . ampicillin-sulbactam (UNASYN) IV     PRN Meds:.acetaminophen **OR** acetaminophen, albuterol, benzonatate, LORazepam **OR** LORazepam, trazodone  SUBJECTIVE:   24 hour events:  No acute events noted overnight Afebrile WBC stable HIV viral load undetectable Repeat blood cultures no growth Initial blood cultures with micrococcus No new imaging   Patient has no new complaints this morning.  He denies fevers or chills.  He seems to be tolerating wound care and antibiotics thus far.  He was relieved to know that his viral load is undetectable.  Review of Systems  All other systems reviewed and are negative.    OBJECTIVE:   Blood pressure 120/72, pulse 96, temperature 98.6 F (37 C), temperature source Oral, resp. rate (!) 23, height 6' 3"  (1.905 m), weight 109.1 kg, SpO2 94 %. Body mass index is 30.06 kg/m.  Physical Exam Constitutional:  General: He is not in acute distress.    Appearance: Normal appearance.  HENT:     Head: Normocephalic and atraumatic.  Eyes:     Extraocular Movements: Extraocular movements intact.     Conjunctiva/sclera: Conjunctivae normal.  Pulmonary:     Effort: Pulmonary effort is normal. No respiratory distress.  Abdominal:     General: There is no distension.     Palpations: Abdomen is soft.     Tenderness: There is no abdominal tenderness.  Skin:    General: Skin  is warm and dry.  Neurological:     General: No focal deficit present.     Mental Status: He is alert. Mental status is at baseline.  Psychiatric:        Mood and Affect: Mood normal.        Behavior: Behavior normal.     Lab Results: Lab Results  Component Value Date   WBC 15.3 (H) 05/13/2021   HGB 8.3 (L) 05/13/2021   HCT 26.9 (L) 05/13/2021   MCV 103.9 (H) 05/13/2021   PLT 141 (L) 05/13/2021    Lab Results  Component Value Date   NA 139 05/13/2021   K 3.9 05/13/2021   CO2 24 05/13/2021   GLUCOSE 88 05/13/2021   BUN 11 05/13/2021   CREATININE 1.28 (H) 05/13/2021   CALCIUM 7.9 (L) 05/13/2021   GFRNONAA >60 05/13/2021   GFRAA 87 11/25/2020    Lab Results  Component Value Date   ALT 28 05/12/2021   AST 32 05/12/2021   ALKPHOS 192 (H) 05/12/2021   BILITOT 0.8 05/12/2021       Component Value Date/Time   CRP 0.8 11/01/2019 0028   CRP 24 (H) 10/01/2019 1700       Component Value Date/Time   ESRSEDRATE 65 (H) 10/01/2019 1700     I have reviewed the micro and lab results in Epic.  Imaging: No results found.   Imaging independently reviewed in Epic.    Rayn[facility] [phone] pager 05/13/2021, 11:15 AM  I spent greater than 35 minutes with the patient including greater than 50% of time in face to face counsel of the patient and in coordination of their care.

## 2021-05-13 NOTE — Plan of Care (Signed)
  Problem: Education: Goal: Knowledge of General Education information will improve Description: Including pain rating scale, medication(s)/side effects and non-pharmacologic comfort measures Outcome: Progressing   Problem: Clinical Measurements: Goal: Cardiovascular complication will be avoided Outcome: Progressing   Problem: Nutrition: Goal: Adequate nutrition will be maintained Outcome: Progressing   Problem: Coping: Goal: Level of anxiety will decrease Outcome: Progressing

## 2021-05-13 NOTE — Progress Notes (Signed)
PROGRESS NOTE    [name]  RSW:[id number] DOB: 04-29-1986 DOA: 05/07/2021 PCP: [name], MD   Chief Complaint  Patient presents with   Leg Swelling    Brief Narrative: Patient is a 35 year old male with history of HIV, PTSD, schizoaffective disorder, seizures, heavy alcohol abuse, hypertension was brought to ED via EMS for altered mental status.  I was unable to obtain any history from the patient due to his mental status (whispering and rambling).  He was able to tell me his name.  Per EMS and EDP records, patient lives alone.  Patient's family called EMS because he was not taking his medications.  He was laying on the couch for the last 2 weeks, covered in feces and urine.  Patient was surrounded by many alcohol bottles.  He was also noted to have increased lower extremity swelling, sores and redness to the feet.   Interval summary 7/30-8/2   Patient was admitted with altered mental status, found on the couch covered in feces and urine, alcohol bottles Received high-dose IV thiamine for 3 days, completed. Patient has extensive cellulitis and wounds of the bilateral buttocks, posterior thighs with purulent and bloody drainage.  Placed on IV vancomycin and Rocephin. General surgery was consulted, CT scans showed drainable abscess.   Blood cultures positive for GPC in clusters, continue current IV antibiotics.  ID consulted   Plan to pursue LTAC for wound care, IV antibiotics.   Assessment & Plan:   Principal Problem:   Acute metabolic encephalopathy Active Problems:   Schizoaffective disorder, depressive type (HCC)   Severe alcohol dependence (HCC)   GERD (gastroesophageal reflux disease)   Elevated LFTs   HIV disease (HCC)   Anemia   Cellulitis   Left hip pain   Transaminitis   #1 acute metabolic encephalopathy -Questionable etiology. -Could possibly be secondary to Warnicke's encephalopathy/Korsakoff syndrome versus hepatic encephalopathy versus secondary  to infectious etiology/cellulitis.  Patient noted to have underlying psychiatric history. -CT head done negative for any acute abnormalities except increase fluid in the right ethmoid air cells. -Ammonia level at 30, TSH 1.1, B1 pending.,  Vitamin B12 at 2821. -Patient noted to have completed high-dose thiamine 500 mg 3 times daily x3 days, currently on thiamine 250 mg daily. -Mental status slowly improving. -Continue current dysphagia diet as recommended by SLP. -Patient was being followed by psychiatry who have signed off.  2.  Cellulitis bilateral lower extremity wounds, sacral/gluteal/thigh wound, POA -Patient noted to have some lower extremity edema felt likely secondary to severe hypoalbuminemia and third spacing. -2D echo with normal EF of 60 to 09%, grade 1 diastolic dysfunction. -Lower extremity Dopplers negative for DVT. -Continue empiric IV vancomycin and IV Rocephin. -General surgery was following however signed off but they state no drainable abscess noted. -ID following and appreciate input and recommendations.  3.  Micrococcus bacteremia  -Repeat blood cultures pending with no growth to date. - on IV vancomycin.   -Per ID.   4.  Acute on chronic anemia/microcytic anemia/thrombocytopenia -Likely alcohol induced. -FOBT negative 05/07/2021 -Anemia panel consistent with anemia of chronic disease. -Patient noted to have some bleeding from wound on posterior thighs, gluteal and sacral area. -Status post transfusion 3 units packed red blood cells . -Hemoglobin stable at 8.3. -Transfusion threshold hemoglobin < 7.   5. transaminitis with elevated alk phosphatase, AST, thrombocytopenia -Likely alcohol induced. -Abdominal ultrasound negative for cholelithiasis or biliary duct dilatation however consistent with hepatic steatosis. -LFTs improving. -Outpatient follow-up.  6.  Acute kidney  injury -Likely secondary to prerenal azotemia in the setting of poor oral intake and  intravascular depletion. -Improved with hydration.   -Follow.  7.  Hypokalemia/hypomagnesemia -Repleted.   -Magnesium at 2.0, potassium at 3.9.   -Follow.    8.  History of alcohol abuse -Continue thiamine, folic acid.   -Status post high-dose thiamine.   -Continue Ativan withdrawal protocol.  9.  HIV disease -Patient noted to be noncompliant with antiretrovirals. -Continue Biktarvy, valacyclovir. -ID following.  10.  Schizoaffective disorder, depressive disorder -Continue current regimen of Lexapro, Latuda, Xanax.   -Psychiatry was following but have signed off.   -Outpatient follow-up with psychiatry.   11.  GERD -Continue PPI.  12.  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 -Plain films of the left hip showed chronic and absent fracture of left femoral head similar to prior exam. -Patient noted to have been scheduled for surgery on 05/18/2021 with orthopedics, Dr. Alvan Dame for total hip arthroplasty. -Patient has been seen in consultation by orthopedics who feel patient is at significant high risk for surgery based on medical comorbidities and now recommending rescheduling surgery once issues have been addressed. -Outpatient follow-up with orthopedics. -PT/OT   DVT prophylaxis: Heparin Code Status: Full Family Communication: No family at bedside. Disposition:   Status is: Inpatient  Remains inpatient appropriate because:IV treatments appropriate due to intensity of illness or inability to take PO  Dispo: The patient is from: LTAC versus SNF              Anticipated d/c is to: Home              Patient currently is not medically stable to d/c.   Difficult to place patient No       Consultants:  Psychiatry: Dr. Dwyane Dee 05/10/2021 Wound care RN 05/10/2021 General surgery: Dr. Redmond Pulling 05/11/2021 Infectious disease: Dr. Juleen China 05/11/2021  Orthopedics:  Costella Hatcher, PA 05/12/2021  Procedures:  CT pelvis 05/10/2021 CT head 05/07/2021 Plain films of the pelvis and left hip 05/08/2021 Chest x-ray 05/07/2021 Abdominal ultrasound 05/07/2021 2D echo 05/08/2021 Lower extremity Doppler 05/08/2021  Antimicrobials:  IV Rocephin 05/07/2021>>>>> 05/14/2021 IV vancomycin 05/08/2021>>>>>>>   Subjective: Sleeping deeply.   Objective: Vitals:   05/12/21 0823 05/12/21 1329 05/12/21 2016 05/13/21 0537  BP:  (!) 103/58 117/79 120/72  Pulse: 94 (!) 104 (!) 103 96  Resp: _0 (!) 23  Temp:  98.9 F (37.2 C) 99.1 F (37.3 C) 98.6 F (37 C)  TempSrc:  Oral Oral Oral  SpO2: 95% 96% 99% 94%  Weight:      Height:        Intake/Output Summary (Last 24 hours) at 05/13/2021 1150 Last data filed at 05/13/2021 0511 Gross per 24 hour  Intake 725.23 ml  Output 900 ml  Net -174.77 ml    Filed Weights   05/08/21 1300  Weight: 109.1 kg    Examination:  General exam: Asleep. Respiratory system: Coarse breath sounds anterior lung fields.  No wheezing.  No crackles.  Fair air movement.  Cardiovascular system: RRR no murmurs rubs or gallops.  No JVD.  No lower extremity edema. Gastrointestinal system: Abdomen is soft, nondistended, nontender, positive bowel sounds.  Central nervous system: Alert. No focal neurological deficits. Extremities: Dressing bilateral outer thighs.   Skin: No rashes, lesions or ulcers Psychiatry: Judgement and insight appear poor to fair.  Mood &  affect appropriate.     Data Reviewed: I have personally reviewed following labs and imaging studies  CBC: Recent Labs  Lab 05/07/21 1443 05/07/21 1912 05/09/21 0544 05/10/21 0416 05/11/21 0429 05/11/21 1056 05/12/21 1019 05/13/21 0520  WBC 18.5*   < > 17.7* 19.8* 19.3*  --  14.5* 15.3*  NEUTROABS 15.3*  --   --   --   --   --  10.8* 10.8*  HGB 7.6*   < > 6.3* 6.2* 6.5* 7.5* 8.2* 8.3*  HCT 23.8*   < > 20.9* 19.9* 20.6* 24.3* 26.4* 26.9*  MCV 106.7*   < > 108.3* 103.6*  102.0*  --  102.3* 103.9*  PLT 142*   < > 126* 128* 147*  --  126* 141*   < > = values in this interval not displayed.     Basic Metabolic Panel: Recent Labs  Lab 05/07/21 1443 05/07/21 2332 05/08/21 0401 05/09/21 0544 05/10/21 0416 05/11/21 0429 05/12/21 0453 05/12/21 1019 05/13/21 0520  NA 134* 133*   < > 134* 134* 137  --  139 139  K 3.8 3.8   < > 3.4* 3.2* 3.3*  --  2.9* 3.9  CL 103 103   < > 104 103 108  --  107 111  CO2 23 22   < > _0 --  24 24  GLUCOSE 98 102*   < > 102* 102* 100*  --  110* 88  BUN 13 12   < > _1 --  12 11  CREATININE 1.32* 1.32*   < > 1.57* 1.71* 1.68* 1.59* 1.51* 1.28*  CALCIUM 8.6* 8.4*   < > 8.0* 8.0* 7.8*  --  7.7* 7.9*  MG 1.8 1.7  --   --   --   --   --  1.3* 2.0  PHOS  --  3.3  --   --   --   --   --  2.6  --    < > = values in this interval not displayed.     GFR: Estimated Creatinine Clearance: 105.4 mL/min (A) (by C-G formula based on SCr of 1.28 mg/dL (H)).  Liver Function Tests: Recent Labs  Lab 05/08/21 0401 05/09/21 0544 05/10/21 0416 05/11/21 0429 05/12/21 1019  AST 57* 45* 42* 41 32  ALT 30 33 _2 ALKPHOS 195* 190* 163* 165* 192*  BILITOT 1.0 0.6 1.1 1.1 0.8  PROT 6.9 6.8 6.6 6.2* 6.9  ALBUMIN 1.7* 1.7* 1.6* 1.4* 1.5*     CBG: No results for input(s): GLUCAP in the last 168 hours.   Recent Results (from the past 240 hour(s))  Resp Panel by RT-PCR (Flu A&B, Covid) Nasopharyngeal Swab     Status: None   Collection Time: 05/07/21  2:48 PM   Specimen: Nasopharyngeal Swab; Nasopharyngeal(NP) swabs in vial transport medium  Result Value Ref Range Status   SARS Coronavirus 2 by RT PCR NEGATIVE NEGATIVE Final    Comment: (NOTE) SARS-CoV-2 target nucleic acids are NOT DETECTED.  The SARS-CoV-2 RNA is generally detectable in upper respiratory specimens during the acute phase of infection. The lowest concentration of SARS-CoV-2 viral copies this assay can detect is 138 copies/mL. A negative result  does not preclude SARS-Cov-2 infection and should not be used as the sole basis for treatment or other patient management decisions. A negative result may occur with  improper specimen collection/handling, submission of specimen other than nasopharyngeal swab, presence of viral mutation(s)  within the areas targeted by this assay, and inadequate number of viral copies(<138 copies/mL). A negative result must be combined with clinical observations, patient history, and epidemiological information. The expected result is Negative.  Fact Sheet for Patients:  EntrepreneurPulse.com.au  Fact Sheet for Healthcare Providers:  IncredibleEmployment.be  This test is no t yet approved or cleared by the [place] FDA and  has been authorized for detection and/or diagnosis of SARS-CoV-2 by FDA under an Emergency Use Authorization (EUA). This EUA will remain  in effect (meaning this test can be used) for the duration of the COVID-19 declaration under Section 564(b)(1) of the Act, 21 U.S.C.section 360bbb-3(b)(1), unless the authorization is terminated  or revoked sooner.       Influenza A by PCR NEGATIVE NEGATIVE Final   Influenza B by PCR NEGATIVE NEGATIVE Final    Comment: (NOTE) The Xpert Xpress SARS-CoV-2/FLU/RSV plus assay is intended as an aid in the diagnosis of influenza from Nasopharyngeal swab specimens and should not be used as a sole basis for treatment. Nasal washings and aspirates are unacceptable for Xpert Xpress SARS-CoV-2/FLU/RSV testing.  Fact Sheet for Patients: EntrepreneurPulse.com.au  Fact Sheet for Healthcare Providers: IncredibleEmployment.be  This test is not yet approved or cleared by the [place] FDA and has been authorized for detection and/or diagnosis of SARS-CoV-2 by FDA under an Emergency Use Authorization (EUA). This EUA will remain in effect (meaning this test can be used) for  the duration of the COVID-19 declaration under Section 564(b)(1) of the Act, 21 U.S.C. section 360bbb-3(b)(1), unless the authorization is terminated or revoked.  Performed at [facility], [place] [address] [place], [place] [address]   Urine Culture     Status: Abnormal   Collection Time: 05/07/21  5:02 PM   Specimen: Urine, Clean Catch  Result Value Ref Range Status   Specimen Description   Final    URINE, CLEAN CATCH Performed at [facility], [place] [address], [place], [place] [address]    Special Requests   Final    NONE Performed at [facility], [place] [address], [place], [place] [address]    Culture MULTIPLE SPECIES PRESENT, SUGGEST RECOLLECTION (A)  Final   Report Status 05/09/2021 FINAL  Final  Culture, blood (Routine X 2) w Reflex to ID Panel     Status: Abnormal   Collection Time: 05/07/21  5:03 PM   Specimen: BLOOD  Result Value Ref Range Status   Specimen Description   Final    BLOOD LEFT ANTECUBITAL Performed at [facility] [address], [place], [place] [address]    Special Requests   Final    BOTTLES DRAWN AEROBIC AND ANAEROBIC Blood Culture adequate volume Performed at [facility] [address], [place], [place] [address]    Culture  Setup Time   Final    GRAM POSITIVE COCCI IN CLUSTERS AEROBIC BOTTLE ONLY CRITICAL VALUE NOTED.  VALUE IS CONSISTENT WITH PREVIOUSLY REPORTED AND CALLED VALUE.    Culture (A)  Final    MICROCOCCUS SPECIES Standardized susceptibility testing for this organism is not available. Performed at [facility], [place] [address], [place], [place] [address]    Report Status 05/12/2021 FINAL  Final  Culture, blood (Routine X 2) w Reflex to ID Panel     Status: Abnormal   Collection Time: 05/07/21  5:08 PM   Specimen: BLOOD  Result Value Ref Range Status   Specimen Description   Final  BLOOD BLOOD LEFT FOREARM Performed at [facility] [address], [place], [place] [address]    Special Requests   Final    BOTTLES DRAWN AEROBIC ONLY Blood Culture results may not be optimal due to an inadequate volume of blood received in culture bottles Performed at [facility] [address], [place], [place] [address]    Culture  Setup Time   Final    GRAM POSITIVE COCCI IN CLUSTERS AEROBIC BOTTLE ONLY CRITICAL RESULT CALLED TO, READ BACK BY AND VERIFIED WITH: PHARM D J.GADHIA ON [id number] AT 0957 BY E.PARRISH    Culture (A)  Final    MICROCOCCUS SPECIES Standardized susceptibility testing for this organism is not available. Performed at [facility], [place] [address], [place], [place] [address]    Report Status 05/12/2021 FINAL  Final  Blood Culture ID Panel (Reflexed)     Status: None   Collection Time: 05/07/21  5:08 PM  Result Value Ref Range Status   Enterococcus faecalis NOT DETECTED NOT DETECTED Final   Enterococcus Faecium NOT DETECTED NOT DETECTED Final   Listeria monocytogenes NOT DETECTED NOT DETECTED Final   Staphylococcus species NOT DETECTED NOT DETECTED Final   Staphylococcus aureus (BCID) NOT DETECTED NOT DETECTED Final   Staphylococcus epidermidis NOT DETECTED NOT DETECTED Final   Staphylococcus lugdunensis NOT DETECTED NOT DETECTED Final   Streptococcus species NOT DETECTED NOT DETECTED Final   Streptococcus agalactiae NOT DETECTED NOT DETECTED Final   Streptococcus pneumoniae NOT DETECTED NOT DETECTED Final   Streptococcus pyogenes NOT DETECTED NOT DETECTED Final   A.calcoaceticus-baumannii NOT DETECTED NOT DETECTED Final   Bacteroides fragilis NOT DETECTED NOT DETECTED Final   Enterobacterales NOT DETECTED NOT DETECTED Final   Enterobacter cloacae complex NOT DETECTED NOT DETECTED Final   Escherichia coli NOT DETECTED NOT DETECTED Final   Klebsiella aerogenes NOT DETECTED NOT DETECTED Final   Klebsiella oxytoca NOT DETECTED NOT DETECTED Final    Klebsiella pneumoniae NOT DETECTED NOT DETECTED Final   Proteus species NOT DETECTED NOT DETECTED Final   Salmonella species NOT DETECTED NOT DETECTED Final   Serratia marcescens NOT DETECTED NOT DETECTED Final   Haemophilus influenzae NOT DETECTED NOT DETECTED Final   Neisseria meningitidis NOT DETECTED NOT DETECTED Final   Pseudomonas aeruginosa NOT DETECTED NOT DETECTED Final   Stenotrophomonas maltophilia NOT DETECTED NOT DETECTED Final   Candida albicans NOT DETECTED NOT DETECTED Final   Candida auris NOT DETECTED NOT DETECTED Final   Candida glabrata NOT DETECTED NOT DETECTED Final   Candida krusei NOT DETECTED NOT DETECTED Final   Candida parapsilosis NOT DETECTED NOT DETECTED Final   Candida tropicalis NOT DETECTED NOT DETECTED Final   Cryptococcus neoformans/gattii NOT DETECTED NOT DETECTED Final    Comment: Performed at [facility], [address], [place], [place] [address]  Culture, blood (routine x 2)     Status: None (Preliminary result)   Collection Time: 05/12/21  9:30 AM   Specimen: BLOOD  Result Value Ref Range Status   Specimen Description   Final    BLOOD RIGHT ANTECUBITAL Performed at [facility] [address] [place], [place] [address]    Special Requests   Final    BOTTLES DRAWN AEROBIC AND ANAEROBIC Blood Culture adequate volume Performed at [facility] [address], [place], [place] [address]    Culture   Final    NO GROWTH < 24 HOURS Performed at [facility],  [address] [place], [place] [address]    Report Status PENDING  Incomplete  Culture, blood (routine x 2)     Status: None (Preliminary result)   Collection Time: 05/12/21  9:30 AM   Specimen: BLOOD  Result Value Ref Range Status   Specimen Description   Final    BLOOD RT HAND Performed at [facility] [address], [place], [place] [address]    Special Requests   Final    BOTTLES DRAWN AEROBIC ONLY Blood  Culture adequate volume Performed at [facility] [address], [place], [place] [address]    Culture   Final    NO GROWTH < 24 HOURS Performed at [facility] [address], [place], [place] [address]    Report Status PENDING  Incomplete          Radiology Studies: No results found.      Scheduled Meds:  ALPRAZolam  2 mg Oral TID   bictegravir-emtricitabine-tenofovir AF  1 tablet Oral Daily   escitalopram  20 mg Oral Daily   famotidine  40 mg Oral Daily   folic acid  1 mg Oral Daily   Gerhardt's butt cream   Topical BID   guaiFENesin  1,200 mg Oral BID   heparin  5,000 Units Subcutaneous Q8H   LORazepam  0-4 mg Intravenous Q6H   Followed by   Derrill Memo ON 05/14/2021] LORazepam  0-4 mg Intravenous Q12H   lurasidone  120 mg Oral q1800   multivitamin with minerals  1 tablet Oral Daily   nicotine  21 mg Transdermal Daily   oxyCODONE-acetaminophen  1 tablet Oral Once   pantoprazole  40 mg Oral BID   thiamine  250 mg Oral Daily   valACYclovir  1,000 mg Oral Daily   Continuous Infusions:  ampicillin-sulbactam (UNASYN) IV       LOS: 6 days    Time spent: 40 minutes    Irine Seal, MD [facility]   To contact the attending provider between 7A-7P or the covering provider during after hours 7P-7A, please log into the web site [email or web address] and access using universal [place] password for that web site. If you do not have the password, please call the hospital operator.  05/13/2021, 11:50 AM

## 2021-05-14 DIAGNOSIS — F1020 Alcohol dependence, uncomplicated: Secondary | ICD-10-CM | POA: Diagnosis not present

## 2021-05-14 DIAGNOSIS — L03119 Cellulitis of unspecified part of limb: Secondary | ICD-10-CM | POA: Diagnosis not present

## 2021-05-14 DIAGNOSIS — B20 Human immunodeficiency virus [HIV] disease: Secondary | ICD-10-CM | POA: Diagnosis not present

## 2021-05-14 LAB — CBC WITH DIFFERENTIAL/PLATELET
Abs Immature Granulocytes: 0.3 10*3/uL — ABNORMAL HIGH (ref 0.00–0.07)
Basophils Absolute: 0.1 10*3/uL (ref 0.0–0.1)
Basophils Relative: 1 %
Eosinophils Absolute: 0.3 10*3/uL (ref 0.0–0.5)
Eosinophils Relative: 2 %
HCT: 25.7 % — ABNORMAL LOW (ref 39.0–52.0)
Hemoglobin: 7.8 g/dL — ABNORMAL LOW (ref 13.0–17.0)
Immature Granulocytes: 2 %
Lymphocytes Relative: 17 %
Lymphs Abs: 2.8 10*3/uL (ref 0.7–4.0)
MCH: 32.4 pg (ref 26.0–34.0)
MCHC: 30.4 g/dL (ref 30.0–36.0)
MCV: 106.6 fL — ABNORMAL HIGH (ref 80.0–100.0)
Monocytes Absolute: 1.2 10*3/uL — ABNORMAL HIGH (ref 0.1–1.0)
Monocytes Relative: 7 %
Neutro Abs: 11.6 10*3/uL — ABNORMAL HIGH (ref 1.7–7.7)
Neutrophils Relative %: 71 %
Platelets: 151 10*3/uL (ref 150–400)
RBC: 2.41 MIL/uL — ABNORMAL LOW (ref 4.22–5.81)
RDW: 19.6 % — ABNORMAL HIGH (ref 11.5–15.5)
WBC: 16.3 10*3/uL — ABNORMAL HIGH (ref 4.0–10.5)
nRBC: 0 % (ref 0.0–0.2)

## 2021-05-14 LAB — BASIC METABOLIC PANEL
Anion gap: 3 — ABNORMAL LOW (ref 5–15)
BUN: 10 mg/dL (ref 6–20)
CO2: 23 mmol/L (ref 22–32)
Calcium: 7.9 mg/dL — ABNORMAL LOW (ref 8.9–10.3)
Chloride: 111 mmol/L (ref 98–111)
Creatinine, Ser: 1.29 mg/dL — ABNORMAL HIGH (ref 0.61–1.24)
GFR, Estimated: >60 mL/min (ref >60)
Glucose, Bld: 117 mg/dL — ABNORMAL HIGH (ref 70–99)
Potassium: 3.2 mmol/L — ABNORMAL LOW (ref 3.5–5.1)
Sodium: 137 mmol/L (ref 135–145)

## 2021-05-14 MED ORDER — IPRATROPIUM-ALBUTEROL 0.5-2.5 (3) MG/3ML IN SOLN
3.0000 mL | Freq: Three times a day (TID) | RESPIRATORY_TRACT | Status: DC
  Administered 2021-05-14 – 2021-05-15 (×3): 3 mL via RESPIRATORY_TRACT
  Filled 2021-05-14 (×3): qty 3

## 2021-05-14 MED ORDER — POTASSIUM CHLORIDE CRYS ER 20 MEQ PO TBCR
40.0000 meq | EXTENDED_RELEASE_TABLET | ORAL | Status: AC
  Administered 2021-05-14 (×2): 40 meq via ORAL
  Filled 2021-05-14 (×2): qty 2

## 2021-05-14 MED ORDER — LORATADINE 10 MG PO TABS
10.0000 mg | ORAL_TABLET | Freq: Every day | ORAL | Status: DC
  Administered 2021-05-14 – 2021-05-19 (×6): 10 mg via ORAL
  Filled 2021-05-14 (×6): qty 1

## 2021-05-14 MED ORDER — AMOXICILLIN-POT CLAVULANATE 875-125 MG PO TABS
1.0000 | ORAL_TABLET | Freq: Two times a day (BID) | ORAL | Status: DC
  Administered 2021-05-14 – 2021-05-19 (×11): 1 via ORAL
  Filled 2021-05-14 (×11): qty 1

## 2021-05-14 MED ORDER — FLUTICASONE PROPIONATE 50 MCG/ACT NA SUSP
2.0000 | Freq: Every day | NASAL | Status: DC
Start: 2021-05-14 — End: 2021-05-19
  Administered 2021-05-14 – 2021-05-19 (×6): 2 via NASAL
  Filled 2021-05-14: qty 16

## 2021-05-14 NOTE — TOC Progression Note (Signed)
Transition of Care [facility]) - Progression Note    Patient Details  Name: [name] MRN: [id number] Date of Birth: Mar 29, 1986  [facility] CM/SW Contact  Ross Ludwig, [place] Phone Number: 05/14/2021, 5:41 PM  Clinical Narrative:     CSW spoke to [facility], they are still reviewing waiting to see how patient does with therapy.  Per [facility], patient's mother thinks he may just want to return back home, waiting to see what final outcome is for patient.  CSW to continue to follow patient's progress throughout discharge planning.       Expected Discharge Plan and Services    LTACH verse SNF verse [facility].                                             Social Determinants of Health (SDOH) Interventions    Readmission Risk Interventions Readmission Risk Prevention Plan 11/01/2019  Home Care Screening Complete  Medication Review (RN CM) Complete  Some recent data might be hidden

## 2021-05-14 NOTE — Evaluation (Addendum)
Physical Therapy Evaluation Patient Details Name: [name] MRN: [id number] DOB: 20-Oct-1985 Today's Date: 05/14/2021   History of Present Illness  Patient is a 35 year old male admitted to ED after family called EMS, patient found in feces/urine with alcohol bottles all around him. Patient admitted with acute metabolic encephalopathy, Cellulitis bilateral lower extremity wounds, sacral/gluteal/thigh wound. PMH includes HIV, PTSD, schizoaffective disorder, seizures  Clinical Impression  Pt admitted with above diagnosis. Pt currently with functional limitations due to the deficits listed below (see PT Problem List). Pt will benefit from skilled PT to increase their independence and safety with mobility to allow discharge to the venue listed below.  Pt assisted with sitting EOB and then RN gave meds.  Pt initiated gait however urgently needed to use bathroom so BSC provided.  RN still present and assisted with pericare and dressing change (soiled).  Pt took seated rest break due to fatigue and then assisted with ambulating short distance in hallway.  Pt does not appear safe to return to apt alone and would benefit from f/u therapy upon d/c.     Follow Up Recommendations SNF;LTACH (f/u therapy in which ever setting MD deems medically necessary)    Equipment Recommendations  Wheelchair (measurements PT);Wheelchair cushion (measurements PT)    Recommendations for Other Services       Precautions / Restrictions Precautions Precautions: Fall Precaution Comments: multiple wounds, loose stool Restrictions Weight Bearing Restrictions: No      Mobility  Bed Mobility Overal bed mobility: Needs Assistance Bed Mobility: Supine to Sit Rolling: Mod assist Sidelying to sit: Min assist;HOB elevated Supine to sit: Mod assist;HOB elevated     General bed mobility comments: increased time for all mobility, assist mostly for lower body    Transfers Overall transfer level: Needs  assistance Equipment used: Rolling walker (2 wheeled) Transfers: Sit to/from [facility] Sit to Stand: Mod assist;From elevated surface;+2 safety/equipment Stand pivot transfers: Mod assist;+2 safety/equipment       General transfer comment: verbal cues for hand placement and weight shifting, pt took a few steps and then requested BSC, mod assist for stability with turning as pt makes big twisting motion with RW and legs  Ambulation/Gait Ambulation/Gait assistance: Min assist;+2 safety/equipment Gait Distance (Feet): 40 Feet Assistive device: Rolling walker (2 wheeled) Gait Pattern/deviations: Step-through pattern;Wide base of support;Decreased stride length;Trunk flexed     General Gait Details: verbal cues for sequence, RW positioning, BOS especially with turning; recliner following for safety  Stairs            Wheelchair Mobility    Modified Rankin (Stroke Patients Only)       Balance Overall balance assessment: Needs assistance Sitting-balance support: Feet supported Sitting balance-Leahy Scale: Fair     Standing balance support: Bilateral upper extremity supported Standing balance-Leahy Scale: Poor Standing balance comment: reliant on UE support and external assist                             Pertinent Vitals/Pain Pain Assessment: Faces Faces Pain Scale: Hurts even more Pain Location: legs and buttock with bed mobility Pain Descriptors / Indicators: Grimacing;Sore Pain Intervention(s): Repositioned;Monitored during session;RN gave pain meds during session    [place] expects to be discharged to:: Private residence Living Arrangements: Alone Available Help at Discharge: Family;Available PRN/intermittently Type of Home: Apartment Home Access: Stairs to enter Entrance Stairs-Rails: Right Entrance Stairs-Number of Steps: 3 Home Layout: One level Home Equipment:  Cane - single point;Walker - 2 wheels Additional  Comments: pt states needing a wheelchair    Prior Function Level of Independence: Needs assistance   Gait / Transfers Assistance Needed: ambulates with walker  ADL's / Homemaking Assistance Needed: patient has an aide that comes M-F for 3-4 hrs to assist with ADL/IADL tasks        Hand Dominance   Dominant Hand: Right    Extremity/Trunk Assessment   Upper Extremity Assessment Upper Extremity Assessment: Generalized weakness    Generalized Weakness Bil LEs, pt awaiting Lt THA (was supposed to have this month (Aug) however likely will now be delayed until pt medically stable)       Communication   Communication: Expressive difficulties (low volume)  Cognition Arousal/Alertness: Awake/alert Behavior During Therapy: WFL for tasks assessed/performed Overall Cognitive Status: No family/caregiver present to determine baseline cognitive functioning                                 General Comments: poor insight of deficits and medical awareness      General Comments      Exercises     Assessment/Plan    PT Assessment Patient needs continued PT services  PT Problem List Decreased balance;Decreased activity tolerance;Decreased strength;Decreased mobility;Decreased knowledge of use of DME;Decreased skin integrity       PT Treatment Interventions DME instruction;Gait training;Balance training;Therapeutic exercise;Wheelchair mobility training;Therapeutic activities;Functional mobility training;Patient/family education;Neuromuscular re-education    PT Goals (Current goals can be found in the Care Plan section)  Acute Rehab PT Goals Patient Stated Goal: to walk PT Goal Formulation: With patient Time For Goal Achievement: 05/28/21 Potential to Achieve Goals: Good    Frequency Min 2X/week   Barriers to discharge        Co-evaluation               AM-PAC PT "6 Clicks" Mobility  Outcome Measure Help needed turning from your back to your side while in  a flat bed without using bedrails?: A Little Help needed moving from lying on your back to sitting on the side of a flat bed without using bedrails?: A Lot Help needed moving to and from a bed to a chair (including a wheelchair)?: A Lot Help needed standing up from a chair using your arms (e.g., wheelchair or bedside chair)?: A Lot Help needed to walk in hospital room?: A Lot Help needed climbing 3-5 steps with a railing? : Total 6 Click Score: 12    End of Session Equipment Utilized During Treatment: Gait belt Activity Tolerance: Patient tolerated treatment well Patient left: in chair;with call bell/phone within reach;with chair alarm set Nurse Communication: Mobility status PT Visit Diagnosis: Other abnormalities of gait and mobility (R26.89);Muscle weakness (generalized) (M62.81)    Time: 3953-2023 PT Time Calculation (min) (ACUTE ONLY): 38 min   Charges:   PT Evaluation $PT Eval Moderate Complexity: 1 Mod PT Treatments $Gait Training: 8-22 mins      Jannette Spanner PT, DPT Acute Rehabilitation Services Pager: [phone] Office: [phone]   [place] Ram E 05/14/2021, 2:59 PM

## 2021-05-14 NOTE — Evaluation (Signed)
Occupational Therapy Evaluation Patient Details Name: [name] MRN: [id number] DOB: November 10, 1985 Today's Date: 05/14/2021    History of Present Illness Patient is a 35 year old male admitted to ED after family called EMS, patient found in feces/urine with alcohol bottles all around him. Patient admitted with acute metabolic encephalopathy, Cellulitis bilateral lower extremity wounds, sacral/gluteal/thigh wound. PMH includes HIV, PTSD, schizoaffective disorder, seizures   Clinical Impression   Patient lives home alone in a single level apartment with 3 steps to enter. Reports using walker for ambulation and had aide M-F for 3-4 hours to assist with self care and IADLs as needed. Currently patient presents with global weakness, decreased balance and activity tolerance needing up to mod A for bed mobility due to painful legs/buttock from wounds. Bed height elevated and patient needing light mod A to power up to standing, assist to manage walker and cues to sequence side stepping to head of bed. Patient needing significant time to advance legs towards head of bed and trying to advance walker too quickly needing cues to redirect. Due to current assist levels and limited assist at home would recommend rehab at D/C, acute OT to follow.    Follow Up Recommendations  SNF    Equipment Recommendations  Tub/shower bench       Precautions / Restrictions Precautions Precautions: Fall Precaution Comments: multiple wounds, loose stool Restrictions Weight Bearing Restrictions: No      Mobility Bed Mobility Overal bed mobility: Needs Assistance Bed Mobility: Rolling;Sidelying to Sit Rolling: Mod assist Sidelying to sit: Min assist;HOB elevated       General bed mobility comments: increased time for all mobility, pain from multiple leg/buttock wounds. mod A to roll onto R side with cues for sequencing and min A to upright trunk    Transfers Overall transfer level: Needs assistance Equipment  used: Rolling walker (2 wheeled) Transfers: Sit to/from Stand Sit to Stand: Mod assist;From elevated surface         General transfer comment: light mod A to power up to standing, cues for hand placement. please see toilet transfer in ADL section.    Balance Overall balance assessment: Needs assistance Sitting-balance support: Feet supported Sitting balance-Leahy Scale: Fair     Standing balance support: Bilateral upper extremity supported Standing balance-Leahy Scale: Poor Standing balance comment: reliant on UE support and external assist                           ADL either performed or assessed with clinical judgement   ADL Overall ADL's : Needs assistance/impaired     Grooming: Set up;Sitting   Upper Body Bathing: Set up;Sitting   Lower Body Bathing: Maximal assistance;Sitting/lateral leans;Sit to/from stand   Upper Body Dressing : Set up;Sitting   Lower Body Dressing: Maximal assistance;Sitting/lateral leans;Sit to/from stand Lower Body Dressing Details (indicate cue type and reason): decreased strength, balance, safety Toilet Transfer: Moderate assistance;RW;Cueing for safety;Cueing for sequencing Toilet Transfer Details (indicate cue type and reason): bed height elevated light mod A to power up to standing from edge of bed. needing mod cues to sequence side stepping toward head of bed. Patient keeps trying to push walker forward to walk however educate patient that due to first time up and only OT to assist want to stay close to bed at this time. Toileting- Clothing Manipulation and Hygiene: Total assistance;Sit to/from stand Toileting - Clothing Manipulation Details (indicate cue type and reason): reliant on UE support  Functional mobility during ADLs: Moderate assistance;Rolling walker;Cueing for safety;Cueing for sequencing General ADL Comments: patient needing assistance for all functional mobility and self care tasks due to weakness, decreased  activity tolerance, safety                  Pertinent Vitals/Pain Pain Assessment: Faces Faces Pain Scale: Hurts even more Pain Location: legs and buttock with bed mobility Pain Descriptors / Indicators: Grimacing Pain Intervention(s): Monitored during session     Hand Dominance Right   Extremity/Trunk Assessment Upper Extremity Assessment Upper Extremity Assessment: Generalized weakness   Lower Extremity Assessment Lower Extremity Assessment: Defer to PT evaluation       Communication Communication Communication: Expressive difficulties (low volume)   Cognition Arousal/Alertness: Awake/alert Behavior During Therapy: WFL for tasks assessed/performed Overall Cognitive Status: No family/caregiver present to determine baseline cognitive functioning                                 General Comments: patient is oriented to time, place, however does ask questions like "can I take this home?" referring to suction/yonker              Home Living Family/patient expects to be discharged to:: Private residence Living Arrangements: Alone Available Help at Discharge: Family;Available PRN/intermittently Type of Home: Apartment Home Access: Stairs to enter Entrance Stairs-Number of Steps: 3 Entrance Stairs-Rails: Right Home Layout: One level     Bathroom Shower/Tub: Teacher, early years/pre: Standard     Home Equipment: Cane - single point;Walker - 2 wheels   Additional Comments: pt states needing a wheelchair      Prior Functioning/Environment Level of Independence: Needs assistance  Gait / Transfers Assistance Needed: ambulates with walker ADL's / Homemaking Assistance Needed: patient has an aide that comes M-F for 3-4 hrs to assist with ADL/IADL tasks            OT Problem List: Decreased strength;Decreased activity tolerance;Impaired balance (sitting and/or standing);Decreased safety awareness;Pain      OT Treatment/Interventions:  Therapeutic exercise;Self-care/ADL training;DME and/or AE instruction;Therapeutic activities;Patient/family education;Balance training    OT Goals(Current goals can be found in the care plan section) Acute Rehab OT Goals Patient Stated Goal: to walk OT Goal Formulation: With patient Time For Goal Achievement: 05/28/21 Potential to Achieve Goals: Good  OT Frequency: Min 2X/week    AM-PAC OT "6 Clicks" Daily Activity     Outcome Measure Help from another person eating meals?: A Little Help from another person taking care of personal grooming?: A Little Help from another person toileting, which includes using toliet, bedpan, or urinal?: A Lot Help from another person bathing (including washing, rinsing, drying)?: A Lot Help from another person to put on and taking off regular upper body clothing?: A Little Help from another person to put on and taking off regular lower body clothing?: A Lot 6 Click Score: 15   End of Session Equipment Utilized During Treatment: Gait belt;Rolling walker Nurse Communication: Mobility status;Other (comment) (apparent rectal bleed from wound, soiled buttock dressings)  Activity Tolerance: Patient tolerated treatment well Patient left: in bed;with call bell/phone within reach;with bed alarm set  OT Visit Diagnosis: Pain;Unsteadiness on feet (R26.81);Other abnormalities of gait and mobility (R26.89);Muscle weakness (generalized) (M62.81) Pain - part of body:  (legs, buttock)                Time: 2440-1027 OT Time Calculation (min): 31 min Charges:  OT General Charges $OT Visit: 1 Visit OT Evaluation $OT Eval Low Complexity: 1 Low OT Treatments $Self Care/Home Management : 8-22 mins  Delbert Phenix OT OT pager: [place] 05/14/2021, 1:38 PM

## 2021-05-14 NOTE — Progress Notes (Signed)
Patient requested medication to help reduce the frequency of the his BMs d/t the painful experience when assisting to get cleaned up. Notified provider on call. New order in Epic an medication given. Will continue to monitor.

## 2021-05-14 NOTE — Progress Notes (Signed)
[facility]  Date of Admission:  05/07/2021           Reason for visit: Follow up on HIV, lower extremity wounds   ASSESSMENT:    Lower extremity sacral/gluteal/thigh wounds: Previously evaluated by general surgery, however, no surgical intervention indicated based on imaging findings.  He has improved on antibiotics and remains afebrile.  WBC is stable today.  Micrococcus positive blood cultures: Unclear significance as often times these are considered normal skin or mouth flora. Nonetheless, his TTE from earlier this admission was negative.  Repeat blood cultures are NGTD.  HIV: CD4 count healthy and viral load undetectable. Alcohol use disorder Avascular necrosis of the left hip  PLAN:    Transition to Augmentin Plan for 2 weeks of therapy 7/29-8/11to complete course of therapy for skin and soft tissue infection and possible uncomplicated bacteremia Continue Biktarvy Continue wound care Will sign off for now, please call as needed.    Principal Problem:   Acute metabolic encephalopathy Active Problems:   Schizoaffective disorder, depressive type (HCC)   Severe alcohol dependence (HCC)   GERD (gastroesophageal reflux disease)   Elevated LFTs   HIV disease (HCC)   Anemia   Cellulitis   Left hip pain   Transaminitis    MEDICATIONS:    Scheduled Meds: . ALPRAZolam  2 mg Oral TID  . amoxicillin-clavulanate  1 tablet Oral Q12H  . bictegravir-emtricitabine-tenofovir AF  1 tablet Oral Daily  . escitalopram  20 mg Oral Daily  . famotidine  40 mg Oral Daily  . fluticasone  2 spray Each Nare Daily  . folic acid  1 mg Oral Daily  . Gerhardt's butt cream   Topical BID  . guaiFENesin  1,200 mg Oral BID  . heparin  5,000 Units Subcutaneous Q8H  . ipratropium-albuterol  3 mL Nebulization TID  . loratadine  10 mg Oral Daily  . LORazepam  0-4 mg Intravenous Q12H  . lurasidone  120 mg Oral q1800  . multivitamin with minerals  1 tablet Oral  Daily  . nicotine  21 mg Transdermal Daily  . oxyCODONE-acetaminophen  1 tablet Oral Once  . pantoprazole  40 mg Oral BID  . thiamine  250 mg Oral Daily  . valACYclovir  1,000 mg Oral Daily   Continuous Infusions:   PRN Meds:.acetaminophen **OR** acetaminophen, albuterol, benzonatate, loperamide HCl, LORazepam **OR** LORazepam, trazodone  SUBJECTIVE:   Pt states he is doing so so this afternoon.  Having some abdominal discomfort.  Diarrhea noted.  Otherwise, no new complaints.   Review of Systems  All other systems reviewed and are negative.    OBJECTIVE:   Blood pressure 121/63, pulse (!) 107, temperature 99.1 F (37.3 C), temperature source Oral, resp. rate 18, height 6' 3"  (1.905 m), weight 109.1 kg, SpO2 100 %. Body mass index is 30.06 kg/m.  Physical Exam Constitutional:      Comments: NAD, somewhat lethargic, sleeping in bed.   Eyes:     General: No scleral icterus.    Extraocular Movements: Extraocular movements intact.  Pulmonary:     Effort: Pulmonary effort is normal. No respiratory distress.  Abdominal:     General: There is no distension.     Palpations: Abdomen is soft.     Tenderness: There is no abdominal tenderness.  Musculoskeletal:     Cervical back: Normal range of motion and neck supple.  Skin:    General: Skin is warm and dry.  Findings: No rash.  Neurological:     General: No focal deficit present.     Mental Status: Mental status is at baseline.     Lab Results: Lab Results  Component Value Date   WBC 16.3 (H) 05/14/2021   HGB 7.8 (L) 05/14/2021   HCT 25.7 (L) 05/14/2021   MCV 106.6 (H) 05/14/2021   PLT 151 05/14/2021    Lab Results  Component Value Date   NA 137 05/14/2021   K 3.2 (L) 05/14/2021   CO2 23 05/14/2021   GLUCOSE 117 (H) 05/14/2021   BUN 10 05/14/2021   CREATININE 1.29 (H) 05/14/2021   CALCIUM 7.9 (L) 05/14/2021   GFRNONAA >60 05/14/2021   GFRAA 87 11/25/2020    Lab Results  Component Value Date   ALT 28  05/12/2021   AST 32 05/12/2021   ALKPHOS 192 (H) 05/12/2021   BILITOT 0.8 05/12/2021       Component Value Date/Time   CRP 0.8 11/01/2019 0028   CRP 24 (H) 10/01/2019 1700       Component Value Date/Time   ESRSEDRATE 65 (H) 10/01/2019 1700     I have reviewed the micro and lab results in Epic.  Imaging: No results found.   Imaging independently reviewed in Epic.    Raynelle Highland for Infectious Disease [facility] [phone] pager 05/14/2021, 3:10 PM

## 2021-05-14 NOTE — Progress Notes (Signed)
PROGRESS NOTE    [name]  AVW:[id number] DOB: 10-28-1985 DOA: 05/07/2021 PCP: [name], MD   Chief Complaint  Patient presents with   Leg Swelling    Brief Narrative: Patient is a 35 year old male with history of HIV, PTSD, schizoaffective disorder, seizures, heavy alcohol abuse, hypertension was brought to ED via EMS for altered mental status.  I was unable to obtain any history from the patient due to his mental status (whispering and rambling).  He was able to tell me his name.  Per EMS and EDP records, patient lives alone.  Patient's family called EMS because he was not taking his medications.  He was laying on the couch for the last 2 weeks, covered in feces and urine.  Patient was surrounded by many alcohol bottles.  He was also noted to have increased lower extremity swelling, sores and redness to the feet.   Interval summary 7/30-8/2   Patient was admitted with altered mental status, found on the couch covered in feces and urine, alcohol bottles Received high-dose IV thiamine for 3 days, completed. Patient has extensive cellulitis and wounds of the bilateral buttocks, posterior thighs with purulent and bloody drainage.  Placed on IV vancomycin and Rocephin. General surgery was consulted, CT scans showed drainable abscess.   Blood cultures positive for GPC in clusters, continue current IV antibiotics.  ID consulted   Plan to pursue LTAC for wound care, IV antibiotics.   Assessment & Plan:   Principal Problem:   Acute metabolic encephalopathy Active Problems:   Schizoaffective disorder, depressive type (HCC)   Severe alcohol dependence (HCC)   GERD (gastroesophageal reflux disease)   Elevated LFTs   HIV disease (HCC)   Anemia   Cellulitis   Left hip pain   Transaminitis   1 acute metabolic encephalopathy -Questionable etiology. -Could possibly be secondary to Warnicke's encephalopathy/Korsakoff syndrome versus hepatic encephalopathy versus secondary to  infectious etiology/cellulitis.  Patient noted to have underlying psychiatric history. -CT head done negative for any acute abnormalities except increase fluid in the right ethmoid air cells. -Ammonia level at 30, TSH 1.1, B1 pending.,  Vitamin B12 at 2821. -Patient noted to have completed high-dose thiamine 500 mg 3 times daily x3 days, currently on thiamine 250 mg daily. -Mental status slowly improving. -Continue current dysphagia diet as recommended by SLP. -Patient was being followed by psychiatry who have signed off.  2.  Cellulitis bilateral lower extremity wounds, sacral/gluteal/thigh wound, POA -Patient noted to have some lower extremity edema felt likely secondary to severe hypoalbuminemia and third spacing. -2D echo with normal EF of 60 to 82%, grade 1 diastolic dysfunction. -Lower extremity Dopplers negative for DVT. -IV antibiotics of vancomycin and Rocephin have been narrowed down to IV Unasyn. -General surgery was following however signed off but they state no drainable abscess noted. -ID following and appreciate input and recommendations.  3.  Micrococcus bacteremia  -Repeat blood cultures pending with no growth to date. -Was on IV vancomycin and transition to IV Unasyn per ID.   4.  Acute on chronic anemia/microcytic anemia/thrombocytopenia -Likely alcohol induced. -FOBT negative 05/07/2021 -Anemia panel consistent with anemia of chronic disease. -Patient noted to have some bleeding from wound on posterior thighs, gluteal and sacral area. -Status post transfusion 3 units packed red blood cells . -Hemoglobin at 7.8. -Transfusion threshold hemoglobin < 7.   5. transaminitis with elevated alk phosphatase, AST, thrombocytopenia -Likely alcohol induced. -Abdominal ultrasound negative for cholelithiasis or biliary duct dilatation however consistent with hepatic steatosis. -LFTs  improving. -Outpatient follow-up.  6.  Acute kidney injury -Likely secondary to prerenal  azotemia in the setting of poor oral intake and intravascular depletion. -Renal of renal function improved with hydration.  -Follow.  7.  Hypokalemia/hypomagnesemia -Potassium at 3.2.   -Magnesium at 2.0.   -K-Dur 40 mEq p.o. every 4 hours x2 doses.  -Follow.    8.  History of alcohol abuse -Continue thiamine, folic acid.   -Status post high-dose thiamine.   -Continue Ativan withdrawal protocol.  9.  HIV disease -Patient noted to be noncompliant with antiretrovirals. -Continue Biktarvy, valacyclovir. -ID following.  10.  Schizoaffective disorder, depressive disorder -Continue current regimen of Lexapro, Latuda, Xanax.   -Psychiatry was following but have signed off.   -Outpatient follow-up with psychiatry.   11.  GERD -PPI  12.  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 -Plain films of the left hip showed chronic and absent fracture of left femoral head similar to prior exam. -Patient noted to have been scheduled for surgery on 05/18/2021 with orthopedics, Dr. Alvan Dame for total hip arthroplasty. -Patient has been seen in consultation by orthopedics who feel patient is at significant high risk for surgery based on medical comorbidities and now recommending rescheduling surgery once issues have been addressed. -Outpatient follow-up with orthopedics. -PT/OT   DVT prophylaxis: Heparin Code Status: Full Family Communication: No family at bedside. Disposition:   Status is: Inpatient  Remains inpatient appropriate because:IV treatments appropriate due to intensity of illness or inability to take PO  Dispo: The patient is from: LTAC versus SNF              Anticipated d/c is to: Home              Patient currently is not medically stable to d/c.   Difficult to place patient No       Consultants:  Psychiatry: Dr. Dwyane Dee 05/10/2021 Wound care RN  05/10/2021 General surgery: Dr. Redmond Pulling 05/11/2021 Infectious disease: Dr. Juleen China 05/11/2021  Orthopedics: Costella Hatcher, PA 05/12/2021  Procedures:  CT pelvis 05/10/2021 CT head 05/07/2021 Plain films of the pelvis and left hip 05/08/2021 Chest x-ray 05/07/2021 Abdominal ultrasound 05/07/2021 2D echo 05/08/2021 Lower extremity Doppler 05/08/2021  Antimicrobials:  IV Rocephin 05/07/2021>>>>> 05/14/2021 IV vancomycin 05/08/2021>>>>>>> 05/13/2021 IV Unasyn 05/13/2021   Subjective: Sleeping deeply.   Objective: Vitals:   05/13/21 1215 05/13/21 1400 05/13/21 2141 05/14/21 0445  BP: (!) 85/48 (!) 99/57 113/67 121/63  Pulse: 99 88 98 (!) 107  Resp:  _0 Temp:  98.4 F (36.9 C) 99.5 F (37.5 C) 99.1 F (37.3 C)  TempSrc:  Oral Oral Oral  SpO2: 95% 96% 98% 100%  Weight:      Height:        Intake/Output Summary (Last 24 hours) at 05/14/2021 1315 Last data filed at 05/14/2021 0500 Gross per 24 hour  Intake 100 ml  Output 750 ml  Net -650 ml    Filed Weights   05/08/21 1300  Weight: 109.1 kg    Examination:  General exam: Asleep. Respiratory system: Minimal expiratory wheezing.  No crackles, fair air movement, some coarse breath sounds.   Cardiovascular system: Regular rate and rhythm no murmurs rubs or gallops.  No JVD.  No lower extremity edema. Gastrointestinal system: Abdomen soft, nontender, nondistended, positive bowel sounds.  Central nervous system: No focal neurological deficits. Extremities: Dressing  bilateral outer thighs.   Skin: No rashes, lesions or ulcers Psychiatry: Judgement and insight appear poor to fair.  Mood & affect appropriate.     Data Reviewed: I have personally reviewed following labs and imaging studies  CBC: Recent Labs  Lab 05/07/21 1443 05/07/21 1912 05/10/21 0416 05/11/21 0429 05/11/21 1056 05/12/21 1019 05/13/21 0520 05/14/21 0457  WBC 18.5*   < > 19.8* 19.3*  --  14.5* 15.3* 16.3*  NEUTROABS 15.3*  --   --   --   --  10.8* 10.8* 11.6*   HGB 7.6*   < > 6.2* 6.5* 7.5* 8.2* 8.3* 7.8*  HCT 23.8*   < > 19.9* 20.6* 24.3* 26.4* 26.9* 25.7*  MCV 106.7*   < > 103.6* 102.0*  --  102.3* 103.9* 106.6*  PLT 142*   < > 128* 147*  --  126* 141* 151   < > = values in this interval not displayed.     Basic Metabolic Panel: Recent Labs  Lab 05/07/21 1443 05/07/21 2332 05/08/21 0401 05/10/21 0416 05/11/21 0429 05/12/21 0453 05/12/21 1019 05/13/21 0520 05/14/21 0457  NA 134* 133*   < > 134* 137  --  139 139 137  K 3.8 3.8   < > 3.2* 3.3*  --  2.9* 3.9 3.2*  CL 103 103   < > 103 108  --  107 111 111  CO2 23 22   < > 22 24  --  _0 GLUCOSE 98 102*   < > 102* 100*  --  110* 88 117*  BUN 13 12   < > 12 12  --  _1 CREATININE 1.32* 1.32*   < > 1.71* 1.68* 1.59* 1.51* 1.28* 1.29*  CALCIUM 8.6* 8.4*   < > 8.0* 7.8*  --  7.7* 7.9* 7.9*  MG 1.8 1.7  --   --   --   --  1.3* 2.0  --   PHOS  --  3.3  --   --   --   --  2.6  --   --    < > = values in this interval not displayed.     GFR: Estimated Creatinine Clearance: 104.6 mL/min (A) (by C-G formula based on SCr of 1.29 mg/dL (H)).  Liver Function Tests: Recent Labs  Lab 05/08/21 0401 05/09/21 0544 05/10/21 0416 05/11/21 0429 05/12/21 1019  AST 57* 45* 42* 41 32  ALT 30 33 _2 ALKPHOS 195* 190* 163* 165* 192*  BILITOT 1.0 0.6 1.1 1.1 0.8  PROT 6.9 6.8 6.6 6.2* 6.9  ALBUMIN 1.7* 1.7* 1.6* 1.4* 1.5*     CBG: No results for input(s): GLUCAP in the last 168 hours.   Recent Results (from the past 240 hour(s))  Resp Panel by RT-PCR (Flu A&B, Covid) Nasopharyngeal Swab     Status: None   Collection Time: 05/07/21  2:48 PM   Specimen: Nasopharyngeal Swab; Nasopharyngeal(NP) swabs in vial transport medium  Result Value Ref Range Status   SARS Coronavirus 2 by RT PCR NEGATIVE NEGATIVE Final    Comment: (NOTE) SARS-CoV-2 target nucleic acids are NOT DETECTED.  The SARS-CoV-2 RNA is generally detectable in upper respiratory specimens during the acute  phase of infection. The lowest concentration of SARS-CoV-2 viral copies this assay can detect is 138 copies/mL. A negative result does not preclude SARS-Cov-2 infection and should not be used as the sole basis for treatment or other patient management decisions. A  negative result may occur with  improper specimen collection/handling, submission of specimen other than nasopharyngeal swab, presence of viral mutation(s) within the areas targeted by this assay, and inadequate number of viral copies(<138 copies/mL). A negative result must be combined with clinical observations, patient history, and epidemiological information. The expected result is Negative.  Fact Sheet for Patients:  EntrepreneurPulse.com.au  Fact Sheet for Healthcare Providers:  IncredibleEmployment.be  This test is no t yet approved or cleared by the [place] FDA and  has been authorized for detection and/or diagnosis of SARS-CoV-2 by FDA under an Emergency Use Authorization (EUA). This EUA will remain  in effect (meaning this test can be used) for the duration of the COVID-19 declaration under Section 564(b)(1) of the Act, 21 U.S.C.section 360bbb-3(b)(1), unless the authorization is terminated  or revoked sooner.       Influenza A by PCR NEGATIVE NEGATIVE Final   Influenza B by PCR NEGATIVE NEGATIVE Final    Comment: (NOTE) The Xpert Xpress SARS-CoV-2/FLU/RSV plus assay is intended as an aid in the diagnosis of influenza from Nasopharyngeal swab specimens and should not be used as a sole basis for treatment. Nasal washings and aspirates are unacceptable for Xpert Xpress SARS-CoV-2/FLU/RSV testing.  Fact Sheet for Patients: EntrepreneurPulse.com.au  Fact Sheet for Healthcare Providers: IncredibleEmployment.be  This test is not yet approved or cleared by the [place] FDA and has been authorized for detection and/or diagnosis of  SARS-CoV-2 by FDA under an Emergency Use Authorization (EUA). This EUA will remain in effect (meaning this test can be used) for the duration of the COVID-19 declaration under Section 564(b)(1) of the Act, 21 U.S.C. section 360bbb-3(b)(1), unless the authorization is terminated or revoked.  Performed at [facility], [place] [address], [place], [place] [address]   Urine Culture     Status: Abnormal   Collection Time: 05/07/21  5:02 PM   Specimen: Urine, Clean Catch  Result Value Ref Range Status   Specimen Description   Final    URINE, CLEAN CATCH Performed at [facility], [place] [address] [place], [place] [address]    Special Requests   Final    NONE Performed at [facility], [place] [address], [place], [place] [address]    Culture MULTIPLE SPECIES PRESENT, SUGGEST RECOLLECTION (A)  Final   Report Status 05/09/2021 FINAL  Final  Culture, blood (Routine X 2) w Reflex to ID Panel     Status: Abnormal   Collection Time: 05/07/21  5:03 PM   Specimen: BLOOD  Result Value Ref Range Status   Specimen Description   Final    BLOOD LEFT ANTECUBITAL Performed at [place] [address], [place], [place] [address]    Special Requests   Final    BOTTLES DRAWN AEROBIC AND ANAEROBIC Blood Culture adequate volume Performed at [place] [address] [place], [place] [address]    Culture  Setup Time   Final    GRAM POSITIVE COCCI IN CLUSTERS AEROBIC BOTTLE ONLY CRITICAL VALUE NOTED.  VALUE IS CONSISTENT WITH PREVIOUSLY REPORTED AND CALLED VALUE.    Culture (A)  Final    MICROCOCCUS SPECIES Standardized susceptibility testing for this organism is not available. Performed at [facility], [place] [address], [place], [place] [address]    Report Status 05/12/2021 FINAL  Final  Culture, blood (Routine X 2) w Reflex to ID Panel     Status: Abnormal   Collection Time: 05/07/21  5:08 PM    Specimen: BLOOD  Result Value Ref Range Status   Specimen Description   Final    BLOOD BLOOD LEFT FOREARM Performed at [place] [address] [place], [place] [address]    Special Requests   Final    BOTTLES DRAWN AEROBIC ONLY Blood Culture results may not be optimal due to an inadequate volume of blood received in culture bottles Performed at [place] [address], [place], [place] [address]    Culture  Setup Time   Final    GRAM POSITIVE COCCI IN CLUSTERS AEROBIC BOTTLE ONLY CRITICAL RESULT CALLED TO, READ BACK BY AND VERIFIED WITH: PHARM D J.GADHIA ON [id number] AT 0957 BY E.PARRISH    Culture (A)  Final    MICROCOCCUS SPECIES Standardized susceptibility testing for this organism is not available. Performed at [facility], [place] [address], [place], [place] [address]    Report Status 05/12/2021 FINAL  Final  Blood Culture ID Panel (Reflexed)     Status: None   Collection Time: 05/07/21  5:08 PM  Result Value Ref Range Status   Enterococcus faecalis NOT DETECTED NOT DETECTED Final   Enterococcus Faecium NOT DETECTED NOT DETECTED Final   Listeria monocytogenes NOT DETECTED NOT DETECTED Final   Staphylococcus species NOT DETECTED NOT DETECTED Final   Staphylococcus aureus (BCID) NOT DETECTED NOT DETECTED Final   Staphylococcus epidermidis NOT DETECTED NOT DETECTED Final   Staphylococcus lugdunensis NOT DETECTED NOT DETECTED Final   Streptococcus species NOT DETECTED NOT DETECTED Final   Streptococcus agalactiae NOT DETECTED NOT DETECTED Final   Streptococcus pneumoniae NOT DETECTED NOT DETECTED Final   Streptococcus pyogenes NOT DETECTED NOT DETECTED Final   A.calcoaceticus-baumannii NOT DETECTED NOT DETECTED Final   Bacteroides fragilis NOT DETECTED NOT DETECTED Final   Enterobacterales NOT DETECTED NOT DETECTED Final   Enterobacter cloacae complex NOT DETECTED NOT DETECTED Final   Escherichia coli NOT DETECTED NOT  DETECTED Final   Klebsiella aerogenes NOT DETECTED NOT DETECTED Final   Klebsiella oxytoca NOT DETECTED NOT DETECTED Final   Klebsiella pneumoniae NOT DETECTED NOT DETECTED Final   Proteus species NOT DETECTED NOT DETECTED Final   Salmonella species NOT DETECTED NOT DETECTED Final   Serratia marcescens NOT DETECTED NOT DETECTED Final   Haemophilus influenzae NOT DETECTED NOT DETECTED Final   Neisseria meningitidis NOT DETECTED NOT DETECTED Final   Pseudomonas aeruginosa NOT DETECTED NOT DETECTED Final   Stenotrophomonas maltophilia NOT DETECTED NOT DETECTED Final   Candida albicans NOT DETECTED NOT DETECTED Final   Candida auris NOT DETECTED NOT DETECTED Final   Candida glabrata NOT DETECTED NOT DETECTED Final   Candida krusei NOT DETECTED NOT DETECTED Final   Candida parapsilosis NOT DETECTED NOT DETECTED Final   Candida tropicalis NOT DETECTED NOT DETECTED Final   Cryptococcus neoformans/gattii NOT DETECTED NOT DETECTED Final    Comment: Performed at [facility], [address], [place], [place] [address]  Culture, blood (routine x 2)     Status: None (Preliminary result)   Collection Time: 05/12/21  9:30 AM   Specimen: BLOOD  Result Value Ref Range Status   Specimen Description   Final    BLOOD RIGHT ANTECUBITAL Performed at [place] [address], [place], [place] [address]    Special Requests   Final    BOTTLES DRAWN AEROBIC AND ANAEROBIC Blood Culture adequate volume Performed at [place] [address] [place], [place]  [address]    Culture   Final    NO GROWTH 2 DAYS Performed at [facility], [place] [address] [place], [place] [address]    Report Status PENDING  Incomplete  Culture, blood (routine x 2)     Status: None (Preliminary result)   Collection Time: 05/12/21  9:30 AM   Specimen: BLOOD  Result Value Ref Range Status   Specimen Description   Final    BLOOD RT HAND Performed at [place] [address], [place], [place] [address]    Special Requests   Final    BOTTLES DRAWN AEROBIC ONLY Blood Culture adequate volume Performed at [place] [address] [place], [place] [address]    Culture   Final    NO GROWTH 2 DAYS Performed at [place] [address], [place], [place] [address]    Report Status PENDING  Incomplete          Radiology Studies: No results found.      Scheduled Meds:  ALPRAZolam  2 mg Oral TID   bictegravir-emtricitabine-tenofovir AF  1 tablet Oral Daily   escitalopram  20 mg Oral Daily   famotidine  40 mg Oral Daily   folic acid  1 mg Oral Daily   Gerhardt's butt cream   Topical BID   guaiFENesin  1,200 mg Oral BID   heparin  5,000 Units Subcutaneous Q8H   LORazepam  0-4 mg Intravenous Q12H   lurasidone  120 mg Oral q1800   multivitamin with minerals  1 tablet Oral Daily   nicotine  21 mg Transdermal Daily   oxyCODONE-acetaminophen  1 tablet Oral Once   pantoprazole  40 mg Oral BID   potassium chloride  40 mEq Oral Q4H   thiamine  250 mg Oral Daily   valACYclovir  1,000 mg Oral Daily   Continuous Infusions:  ampicillin-sulbactam (UNASYN) IV Stopped (05/14/21 0939)     LOS: 7 days    Time spent: 40 minutes    Irine Seal, MD [facility]   To contact the attending provider between 7A-7P or the covering provider during after hours 7P-7A, please log into the web site [email or web address] and access using universal [place] password for that web site. If you do not have the password, please call the hospital operator.  05/14/2021, 1:15 PM

## 2021-05-14 NOTE — Plan of Care (Signed)
  Problem: Clinical Measurements: Goal: Respiratory complications will improve Outcome: Progressing Goal: Cardiovascular complication will be avoided Outcome: Progressing   Problem: Activity: Goal: Risk for activity intolerance will decrease Outcome: Progressing   Problem: Nutrition: Goal: Adequate nutrition will be maintained Outcome: Progressing   Problem: Coping: Goal: Level of anxiety will decrease Outcome: Progressing

## 2021-05-15 LAB — BASIC METABOLIC PANEL
Anion gap: 5 (ref 5–15)
BUN: 10 mg/dL (ref 6–20)
CO2: 24 mmol/L (ref 22–32)
Calcium: 7.7 mg/dL — ABNORMAL LOW (ref 8.9–10.3)
Chloride: 107 mmol/L (ref 98–111)
Creatinine, Ser: 1.3 mg/dL — ABNORMAL HIGH (ref 0.61–1.24)
GFR, Estimated: >60 mL/min (ref >60)
Glucose, Bld: 82 mg/dL (ref 70–99)
Potassium: 3.6 mmol/L (ref 3.5–5.1)
Sodium: 136 mmol/L (ref 135–145)

## 2021-05-15 LAB — CBC WITH DIFFERENTIAL/PLATELET
Abs Immature Granulocytes: 0.25 10*3/uL — ABNORMAL HIGH (ref 0.00–0.07)
Basophils Absolute: 0.1 10*3/uL (ref 0.0–0.1)
Basophils Relative: 1 %
Eosinophils Absolute: 0.2 10*3/uL (ref 0.0–0.5)
Eosinophils Relative: 2 %
HCT: 22.1 % — ABNORMAL LOW (ref 39.0–52.0)
Hemoglobin: 6.7 g/dL — CL (ref 13.0–17.0)
Immature Granulocytes: 2 %
Lymphocytes Relative: 23 %
Lymphs Abs: 3.2 10*3/uL (ref 0.7–4.0)
MCH: 31.8 pg (ref 26.0–34.0)
MCHC: 30.3 g/dL (ref 30.0–36.0)
MCV: 104.7 fL — ABNORMAL HIGH (ref 80.0–100.0)
Monocytes Absolute: 1.3 10*3/uL — ABNORMAL HIGH (ref 0.1–1.0)
Monocytes Relative: 9 %
Neutro Abs: 9.1 10*3/uL — ABNORMAL HIGH (ref 1.7–7.7)
Neutrophils Relative %: 63 %
Platelets: 158 10*3/uL (ref 150–400)
RBC: 2.11 MIL/uL — ABNORMAL LOW (ref 4.22–5.81)
RDW: 19.4 % — ABNORMAL HIGH (ref 11.5–15.5)
WBC: 14 10*3/uL — ABNORMAL HIGH (ref 4.0–10.5)
nRBC: 0 % (ref 0.0–0.2)

## 2021-05-15 LAB — PREPARE RBC (CROSSMATCH)

## 2021-05-15 LAB — OCCULT BLOOD X 1 CARD TO LAB, STOOL: Fecal Occult Bld: NEGATIVE

## 2021-05-15 LAB — MAGNESIUM: Magnesium: 1.6 mg/dL — ABNORMAL LOW (ref 1.7–2.4)

## 2021-05-15 MED ORDER — IPRATROPIUM-ALBUTEROL 0.5-2.5 (3) MG/3ML IN SOLN
3.0000 mL | Freq: Two times a day (BID) | RESPIRATORY_TRACT | Status: DC
  Administered 2021-05-15 – 2021-05-19 (×7): 3 mL via RESPIRATORY_TRACT
  Filled 2021-05-15 (×8): qty 3

## 2021-05-15 MED ORDER — SODIUM CHLORIDE 0.9% IV SOLUTION: Freq: Once | INTRAVENOUS | Status: AC

## 2021-05-15 MED ORDER — ACETAMINOPHEN 325 MG PO TABS
650.0000 mg | ORAL_TABLET | Freq: Once | ORAL | Status: AC
  Administered 2021-05-15: 650 mg via ORAL
  Filled 2021-05-15: qty 2

## 2021-05-15 MED ORDER — MAGNESIUM OXIDE -MG SUPPLEMENT 400 (240 MG) MG PO TABS
400.0000 mg | ORAL_TABLET | Freq: Two times a day (BID) | ORAL | Status: DC
  Administered 2021-05-15 – 2021-05-19 (×8): 400 mg via ORAL
  Filled 2021-05-15 (×8): qty 1

## 2021-05-15 MED ORDER — MAGNESIUM SULFATE 4 GM/100ML IV SOLN
4.0000 g | Freq: Once | INTRAVENOUS | Status: AC
  Administered 2021-05-15: 4 g via INTRAVENOUS
  Filled 2021-05-15: qty 100

## 2021-05-15 NOTE — Progress Notes (Signed)
CRITICAL VALUE STICKER  CRITICAL VALUE: Hgb 6.7  RECEIVER (on-site recipient of call): Ardeen Garland, RN  DATE & TIME NOTIFIED: [phone]  MD NOTIFIED: Irine Seal  TIME OF NOTIFICATION: 0827  RESPONSE:  See new orders

## 2021-05-15 NOTE — Progress Notes (Signed)
PROGRESS NOTE    [name]  [name] DOB: [birth year] DOA: 05/07/2021 PCP: Drew Ebbs, MD   Chief Complaint  Patient presents with   Leg Swelling    Brief Narrative: Patient is a 35 year old male with history of HIV, PTSD, schizoaffective disorder, seizures, heavy alcohol abuse, hypertension was brought to ED via EMS for altered mental status.  I was unable to obtain any history from the patient due to his mental status (whispering and rambling).  He was able to tell me his name.  Per EMS and EDP records, patient lives alone.  Patient's family called EMS because he was not taking his medications.  He was laying on the couch for the last 2 weeks, covered in feces and urine.  Patient was surrounded by many alcohol bottles.  He was also noted to have increased lower extremity swelling, sores and redness to the feet.   Interval summary 7/30-8/2   Patient was admitted with altered mental status, found on the couch covered in feces and urine, alcohol bottles Received high-dose IV thiamine for 3 days, completed. Patient has extensive cellulitis and wounds of the bilateral buttocks, posterior thighs with purulent and bloody drainage.  Placed on IV vancomycin and Rocephin. General surgery was consulted, CT scans showed drainable abscess.   Blood cultures positive for GPC in clusters, continue current IV antibiotics.  ID consulted   Plan to pursue LTAC for wound care, IV antibiotics.   Assessment & Plan:   Principal Problem:   Acute metabolic encephalopathy Active Problems:   Schizoaffective disorder, depressive type (HCC)   Severe alcohol dependence (HCC)   GERD (gastroesophageal reflux disease)   Elevated LFTs   HIV disease (HCC)   Anemia   Cellulitis   Left hip pain   Transaminitis   1 acute metabolic encephalopathy -Questionable etiology. -Could possibly be secondary to Wernicke's encephalopathy/Korsakoff syndrome versus hepatic encephalopathy versus secondary to  infectious etiology/cellulitis.  Patient noted to have underlying psychiatric history. -CT head done negative for any acute abnormalities except increase fluid in the right ethmoid air cells. -Ammonia level at 30, TSH 1.1, B1 pending.,  Vitamin B12 at 2821. -Patient noted to have completed high-dose thiamine 500 mg 3 times daily x3 days, currently on thiamine 250 mg daily. -Mental status slowly improving. -Continue current dysphagia diet as recommended by SLP. -Patient was being followed by psychiatry who have signed off.  2.  Cellulitis bilateral lower extremity wounds, sacral/gluteal/thigh wound, POA -Patient noted to have some lower extremity edema felt likely secondary to severe hypoalbuminemia and third spacing. -2D echo with normal EF of 60 to 31%, grade 1 diastolic dysfunction. -Lower extremity Dopplers negative for DVT. -IV antibiotics of vancomycin and Rocephin were narrowed to IV Unasyn and subsequently transition to oral Augmentin per ID to complete course of treatment.  -General surgery was following however signed off but they state no drainable abscess noted. -ID following and appreciate input and recommendations.  3.  Micrococcus bacteremia  -Repeat blood cultures pending with no growth to date x3 days. -Was on IV vancomycin and transition to IV Unasyn and subsequently transition to oral Augmentin per ID to complete course of antibiotic treatment.    4.  Acute on chronic anemia/microcytic anemia/thrombocytopenia -Likely alcohol induced. -FOBT negative 05/07/2021, 05/15/2021 -Anemia panel consistent with anemia of chronic disease. -Patient noted to have some bleeding from wound on posterior thighs, gluteal and sacral area. -Status post transfusion 3 units packed red blood cells . -Hemoglobin at 6.7 this morning. -Patient noted to  have had EGD done 02/2021 with gastric ulcers noted and patient placed on PPI twice daily with recommendations for repeat ERCP in 3 months to follow-up  on healing. -Transfuse 2 units packed red blood cells.. -Transfusion threshold hemoglobin < 7.   5. transaminitis with elevated alk phosphatase, AST, thrombocytopenia -Likely alcohol induced. -Abdominal ultrasound negative for cholelithiasis or biliary duct dilatation however consistent with hepatic steatosis. -LFTs improving. -Outpatient follow-up.  6.  Acute kidney injury -Likely secondary to prerenal azotemia in the setting of poor oral intake and intravascular depletion. -Improved with hydration.   -Follow.   7.  Hypokalemia/hypomagnesemia -Potassium at 3.6 -Magnesium at 1.6. -Place on magnesium sulfate 4 g IV x1. -Place on oral magnesium oxide. -Repeat labs in the morning.  8.  History of alcohol abuse -No signs of withdrawal noted. -Status post high-dose thiamine.   -Continue thiamine, folic acid.   -DC the Ativan withdrawal protocol.    9.  HIV disease -Patient noted to be noncompliant with antiretrovirals. -Continue valacyclovir, Biktarvy.   -ID following.    10.  Schizoaffective disorder, depressive disorder -Continue current regimen of Lexapro, Latuda, Xanax.   -Psychiatry was following but have signed off.   -Outpatient follow-up with psychiatry.   11.  GERD -Continue PPI.  12.  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 -Plain films of the left hip showed chronic and absent fracture of left femoral head similar to prior exam. -Patient noted to have been scheduled for surgery on 05/18/2021 with orthopedics, Dr. Alvan Dame for total hip arthroplasty. -Patient has been seen in consultation by orthopedics who feel patient is at significant high risk for surgery based on medical comorbidities and now recommending rescheduling surgery once issues have been addressed. -Outpatient follow-up with orthopedics. -PT/OT   DVT prophylaxis: SCDs Code  Status: Full Family Communication: No family at bedside. Disposition:   Status is: Inpatient  Remains inpatient appropriate because:IV treatments appropriate due to intensity of illness or inability to take PO  Dispo: The patient is from: LTAC versus SNF              Anticipated d/c is to: Home              Patient currently is not medically stable to d/c.   Difficult to place patient No       Consultants:  Psychiatry: Dr. Dwyane Dee 05/10/2021 Wound care RN 05/10/2021 General surgery: Dr. Redmond Pulling 05/11/2021 Infectious disease: Dr. Juleen China 05/11/2021  Orthopedics: Costella Hatcher, PA 05/12/2021  Procedures:  CT pelvis 05/10/2021 CT head 05/07/2021 Plain films of the pelvis and left hip 05/08/2021 Chest x-ray 05/07/2021 Abdominal ultrasound 05/07/2021 2D echo 05/08/2021 Lower extremity Doppler 05/08/2021 Transfusing 2 units packed red blood cells pending 05/15/2021 Transfusion of 3 units PRBCs 7/31, 8/ 1, 8/ 2.  Antimicrobials:  IV Rocephin 05/07/2021>>>>> 05/14/2021 IV vancomycin 05/08/2021>>>>>>> 05/13/2021 IV Unasyn 05/13/2021>>>>> 05/14/2021 Augmentin 05/14/2021>>>>>> 05/22/2021   Subjective: Awake.  Alert.  Patient states does not feel too well today.  Patient with complaints of congestion.  Denies any significant shortness of breath.  Denies any significant chest pain.  No abdominal pain.  Poor appetite, poor oral intake per RN.  Patient denies any overt GI bleeding.  Objective: Vitals:   05/14/21 2214 05/15/21 0622 05/15/21 0728 05/15/21 0810  BP: 112/65 114/71  (!) 102/59  Pulse: (!) 106 94  (!) 108  Resp: 20 18  Temp: 98.9 F (37.2 C) 98.7 F (37.1 C)  98.6 F (37 C)  TempSrc: Oral Oral  Axillary  SpO2: 98% 97% 99% 96%  Weight:      Height:        Intake/Output Summary (Last 24 hours) at 05/15/2021 1316 Last data filed at 05/14/2021 2325 Gross per 24 hour  Intake 240 ml  Output --  Net 240 ml    Filed Weights   05/08/21 1300  Weight: 109.1 kg    Examination:  General exam:  NAD Respiratory system: Some scattered coarse/rhonchorous breath sounds.  No wheezing noted.  Fair air movement.  Cardiovascular system: RRR no murmurs rubs or gallops.  No JVD.  No lower extremity edema. Gastrointestinal system: Abdomen is soft, nontender, nondistended, positive bowel sounds.  No rebound.  No guarding.   Central nervous system: No focal neurological deficits. Extremities: Dressing bilateral outer thighs.   Skin: No rashes, lesions or ulcers Psychiatry: Judgement and insight appear fair.  Mood & affect appropriate.     Data Reviewed: I have personally reviewed following labs and imaging studies  CBC: Recent Labs  Lab 05/11/21 0429 05/11/21 1056 05/12/21 1019 05/13/21 0520 05/14/21 0457 05/15/21 0801  WBC 19.3*  --  14.5* 15.3* 16.3* 14.0*  NEUTROABS  --   --  10.8* 10.8* 11.6* 9.1*  HGB 6.5* 7.5* 8.2* 8.3* 7.8* 6.7*  HCT 20.6* 24.3* 26.4* 26.9* 25.7* 22.1*  MCV 102.0*  --  102.3* 103.9* 106.6* 104.7*  PLT 147*  --  126* 141* 151 158     Basic Metabolic Panel: Recent Labs  Lab 05/11/21 0429 05/12/21 0453 05/12/21 1019 05/13/21 0520 05/14/21 0457 05/15/21 0801  NA 137  --  139 139 137 136  K 3.3*  --  2.9* 3.9 3.2* 3.6  CL 108  --  107 111 111 107  CO2 24  --  24 24 23 24   GLUCOSE 100*  --  110* 88 117* 82  BUN 12  --  12 11 10 10   CREATININE 1.68* 1.59* 1.51* 1.28* 1.29* 1.30*  CALCIUM 7.8*  --  7.7* 7.9* 7.9* 7.7*  MG  --   --  1.3* 2.0  --  1.6*  PHOS  --   --  2.6  --   --   --      GFR: Estimated Creatinine Clearance: 103.8 mL/min (A) (by C-G formula based on SCr of 1.3 mg/dL (H)).  Liver Function Tests: Recent Labs  Lab 05/09/21 0544 05/10/21 0416 05/11/21 0429 05/12/21 1019  AST 45* 42* 41 32  ALT 33 31 30 28   ALKPHOS 190* 163* 165* 192*  BILITOT 0.6 1.1 1.1 0.8  PROT 6.8 6.6 6.2* 6.9  ALBUMIN 1.7* 1.6* 1.4* 1.5*     CBG: No results for input(s): GLUCAP in the last 168 hours.   Recent Results (from the past 240 hour(s))   Resp Panel by RT-PCR (Flu A&B, Covid) Nasopharyngeal Swab     Status: None   Collection Time: 05/07/21  2:48 PM   Specimen: Nasopharyngeal Swab; Nasopharyngeal(NP) swabs in vial transport medium  Result Value Ref Range Status   SARS Coronavirus 2 by RT PCR NEGATIVE NEGATIVE Final    Comment: (NOTE) SARS-CoV-2 target nucleic acids are NOT DETECTED.  The SARS-CoV-2 RNA is generally detectable in upper respiratory specimens during the acute phase of infection. The lowest concentration of SARS-CoV-2 viral copies this assay can detect is 138 copies/mL. A negative result does not preclude SARS-Cov-2 infection and should  not be used as the sole basis for treatment or other patient management decisions. A negative result may occur with  improper specimen collection/handling, submission of specimen other than nasopharyngeal swab, presence of viral mutation(s) within the areas targeted by this assay, and inadequate number of viral copies(<138 copies/mL). A negative result must be combined with clinical observations, patient history, and epidemiological information. The expected result is Negative.  Fact Sheet for Patients:  EntrepreneurPulse.com.au  Fact Sheet for Healthcare Providers:  IncredibleEmployment.be  This test is no t yet approved or cleared by the [place] FDA and  has been authorized for detection and/or diagnosis of SARS-CoV-2 by FDA under an Emergency Use Authorization (EUA). This EUA will remain  in effect (meaning this test can be used) for the duration of the COVID-19 declaration under Section 564(b)(1) of the Act, 21 U.S.C.section 360bbb-3(b)(1), unless the authorization is terminated  or revoked sooner.       Influenza A by PCR NEGATIVE NEGATIVE Final   Influenza B by PCR NEGATIVE NEGATIVE Final    Comment: (NOTE) The Xpert Xpress SARS-CoV-2/FLU/RSV plus assay is intended as an aid in the diagnosis of influenza from  Nasopharyngeal swab specimens and should not be used as a sole basis for treatment. Nasal washings and aspirates are unacceptable for Xpert Xpress SARS-CoV-2/FLU/RSV testing.  Fact Sheet for Patients: EntrepreneurPulse.com.au  Fact Sheet for Healthcare Providers: IncredibleEmployment.be  This test is not yet approved or cleared by the [place] FDA and has been authorized for detection and/or diagnosis of SARS-CoV-2 by FDA under an Emergency Use Authorization (EUA). This EUA will remain in effect (meaning this test can be used) for the duration of the COVID-19 declaration under Section 564(b)(1) of the Act, 21 U.S.C. section 360bbb-3(b)(1), unless the authorization is terminated or revoked.  Performed at [facility], [place] [address], [place], [place] [address]   Urine Culture     Status: Abnormal   Collection Time: 05/07/21  5:02 PM   Specimen: Urine, Clean Catch  Result Value Ref Range Status   Specimen Description   Final    URINE, CLEAN CATCH Performed at [facility], [place] [address], [place], [place] [address]    Special Requests   Final    NONE Performed at [facility], [place] [address] [place], [place] [address]    Culture MULTIPLE SPECIES PRESENT, SUGGEST RECOLLECTION (A)  Final   Report Status 05/09/2021 FINAL  Final  Culture, blood (Routine X 2) w Reflex to ID Panel     Status: Abnormal   Collection Time: 05/07/21  5:03 PM   Specimen: BLOOD  Result Value Ref Range Status   Specimen Description   Final    BLOOD LEFT ANTECUBITAL Performed at [place] [address], [place], [place] [address]    Special Requests   Final    BOTTLES DRAWN AEROBIC AND ANAEROBIC Blood Culture adequate volume Performed at [facility] [address], [place], [place] [address]    Culture  Setup Time   Final    GRAM POSITIVE COCCI IN  CLUSTERS AEROBIC BOTTLE ONLY CRITICAL VALUE NOTED.  VALUE IS CONSISTENT WITH PREVIOUSLY REPORTED AND CALLED VALUE.    Culture (A)  Final    MICROCOCCUS SPECIES Standardized susceptibility testing for this organism is not available. Performed at [facility], [place] [address], [place], [place] [address]    Report Status 05/12/2021 FINAL  Final  Culture, blood (Routine X 2) w  Reflex to ID Panel     Status: Abnormal   Collection Time: 05/07/21  5:08 PM   Specimen: BLOOD  Result Value Ref Range Status   Specimen Description   Final    BLOOD BLOOD LEFT FOREARM Performed at [facility] [address], [place], [place] [address]    Special Requests   Final    BOTTLES DRAWN AEROBIC ONLY Blood Culture results may not be optimal due to an inadequate volume of blood received in culture bottles Performed at [facility] [address] [place], [place] [address]    Culture  Setup Time   Final    GRAM POSITIVE COCCI IN CLUSTERS AEROBIC BOTTLE ONLY CRITICAL RESULT CALLED TO, READ BACK BY AND VERIFIED WITH: PHARM D J.GADHIA ON [id number] AT 0957 BY E.PARRISH    Culture (A)  Final    MICROCOCCUS SPECIES Standardized susceptibility testing for this organism is not available. Performed at [facility], [place] [address], [place], [place] [address]    Report Status 05/12/2021 FINAL  Final  Blood Culture ID Panel (Reflexed)     Status: None   Collection Time: 05/07/21  5:08 PM  Result Value Ref Range Status   Enterococcus faecalis NOT DETECTED NOT DETECTED Final   Enterococcus Faecium NOT DETECTED NOT DETECTED Final   Listeria monocytogenes NOT DETECTED NOT DETECTED Final   Staphylococcus species NOT DETECTED NOT DETECTED Final   Staphylococcus aureus (BCID) NOT DETECTED NOT DETECTED Final   Staphylococcus epidermidis NOT DETECTED NOT DETECTED Final   Staphylococcus lugdunensis NOT DETECTED NOT DETECTED Final   Streptococcus species NOT DETECTED  NOT DETECTED Final   Streptococcus agalactiae NOT DETECTED NOT DETECTED Final   Streptococcus pneumoniae NOT DETECTED NOT DETECTED Final   Streptococcus pyogenes NOT DETECTED NOT DETECTED Final   A.calcoaceticus-baumannii NOT DETECTED NOT DETECTED Final   Bacteroides fragilis NOT DETECTED NOT DETECTED Final   Enterobacterales NOT DETECTED NOT DETECTED Final   Enterobacter cloacae complex NOT DETECTED NOT DETECTED Final   Escherichia coli NOT DETECTED NOT DETECTED Final   Klebsiella aerogenes NOT DETECTED NOT DETECTED Final   Klebsiella oxytoca NOT DETECTED NOT DETECTED Final   Klebsiella pneumoniae NOT DETECTED NOT DETECTED Final   Proteus species NOT DETECTED NOT DETECTED Final   Salmonella species NOT DETECTED NOT DETECTED Final   Serratia marcescens NOT DETECTED NOT DETECTED Final   Haemophilus influenzae NOT DETECTED NOT DETECTED Final   Neisseria meningitidis NOT DETECTED NOT DETECTED Final   Pseudomonas aeruginosa NOT DETECTED NOT DETECTED Final   Stenotrophomonas maltophilia NOT DETECTED NOT DETECTED Final   Candida albicans NOT DETECTED NOT DETECTED Final   Candida auris NOT DETECTED NOT DETECTED Final   Candida glabrata NOT DETECTED NOT DETECTED Final   Candida krusei NOT DETECTED NOT DETECTED Final   Candida parapsilosis NOT DETECTED NOT DETECTED Final   Candida tropicalis NOT DETECTED NOT DETECTED Final   Cryptococcus neoformans/gattii NOT DETECTED NOT DETECTED Final    Comment: Performed at [facility], [address], [place], [place] [address]  Culture, blood (routine x 2)     Status: None (Preliminary result)   Collection Time: 05/12/21  9:30 AM   Specimen: BLOOD  Result Value Ref Range Status   Specimen Description   Final    BLOOD RIGHT ANTECUBITAL Performed at [place] [address], [place], [place] [address]    Special Requests   Final    BOTTLES DRAWN AEROBIC AND ANAEROBIC Blood  Culture adequate volume Performed at [place] [address], [place], [place] [address]    Culture   Final    NO GROWTH 3 DAYS Performed at [facility], [place] [address], [place], [place] [address]    Report Status PENDING  Incomplete  Culture, blood (routine x 2)     Status: None (Preliminary result)   Collection Time: 05/12/21  9:30 AM   Specimen: BLOOD  Result Value Ref Range Status   Specimen Description   Final    BLOOD RT HAND Performed at [place] [address], [place], [place] [address]    Special Requests   Final    BOTTLES DRAWN AEROBIC ONLY Blood Culture adequate volume Performed at [place] [address] [place], [place] [address]    Culture   Final    NO GROWTH 3 DAYS Performed at [facility], [place] [address] [place], [place] [address]    Report Status PENDING  Incomplete          Radiology Studies: No results found.      Scheduled Meds:  sodium chloride   Intravenous Once   acetaminophen  650 mg Oral Once   ALPRAZolam  2 mg Oral TID   amoxicillin-clavulanate  1 tablet Oral Q12H   bictegravir-emtricitabine-tenofovir AF  1 tablet Oral Daily   escitalopram  20 mg Oral Daily   famotidine  40 mg Oral Daily   fluticasone  2 spray Each Nare Daily   folic acid  1 mg Oral Daily   Gerhardt's butt cream   Topical BID   guaiFENesin  1,200 mg Oral BID   heparin  5,000 Units Subcutaneous Q8H   ipratropium-albuterol  3 mL Nebulization BID   loratadine  10 mg Oral Daily   LORazepam  0-4 mg Intravenous Q12H   lurasidone  120 mg Oral q1800   multivitamin with minerals  1 tablet Oral Daily   nicotine  21 mg Transdermal Daily   oxyCODONE-acetaminophen  1 tablet Oral Once   pantoprazole  40 mg Oral BID   thiamine  250 mg Oral Daily   valACYclovir  1,000 mg Oral Daily   Continuous Infusions:  magnesium sulfate bolus IVPB       LOS: 8 days    Time spent: 40 minutes    Irine Seal, MD [facility]   To contact the attending provider between 7A-7P or the covering provider during after hours 7P-7A, please log into the web site [email or web address] and access using universal [place] password for that web site. If you do not have the password, please call the hospital operator.  05/15/2021, 1:16 PM

## 2021-05-16 DIAGNOSIS — D649 Anemia, unspecified: Secondary | ICD-10-CM

## 2021-05-16 LAB — BPAM RBC
Blood Product Expiration Date: 202209032359
Blood Product Expiration Date: 202209062359
ISSUE DATE / TIME: 202208061419
ISSUE DATE / TIME: 202208061747
Unit Type and Rh: 5100
Unit Type and Rh: 5100

## 2021-05-16 LAB — BASIC METABOLIC PANEL
Anion gap: 5 (ref 5–15)
BUN: 9 mg/dL (ref 6–20)
CO2: 27 mmol/L (ref 22–32)
Calcium: 8.1 mg/dL — ABNORMAL LOW (ref 8.9–10.3)
Chloride: 108 mmol/L (ref 98–111)
Creatinine, Ser: 1.32 mg/dL — ABNORMAL HIGH (ref 0.61–1.24)
GFR, Estimated: >60 mL/min (ref >60)
Glucose, Bld: 81 mg/dL (ref 70–99)
Potassium: 3.4 mmol/L — ABNORMAL LOW (ref 3.5–5.1)
Sodium: 140 mmol/L (ref 135–145)

## 2021-05-16 LAB — TYPE AND SCREEN
ABO/RH(D): O POS
Antibody Screen: NEGATIVE
Unit division: 0
Unit division: 0

## 2021-05-16 LAB — CBC
HCT: 29.4 % — ABNORMAL LOW (ref 39.0–52.0)
Hemoglobin: 9.3 g/dL — ABNORMAL LOW (ref 13.0–17.0)
MCH: 31.6 pg (ref 26.0–34.0)
MCHC: 31.6 g/dL (ref 30.0–36.0)
MCV: 100 fL (ref 80.0–100.0)
Platelets: 185 10*3/uL (ref 150–400)
RBC: 2.94 MIL/uL — ABNORMAL LOW (ref 4.22–5.81)
RDW: 19.9 % — ABNORMAL HIGH (ref 11.5–15.5)
WBC: 15.3 10*3/uL — ABNORMAL HIGH (ref 4.0–10.5)
nRBC: 0 % (ref 0.0–0.2)

## 2021-05-16 LAB — MAGNESIUM: Magnesium: 2.1 mg/dL (ref 1.7–2.4)

## 2021-05-16 MED ORDER — BOOST / RESOURCE BREEZE PO LIQD CUSTOM
1.0000 | Freq: Two times a day (BID) | ORAL | Status: DC
  Administered 2021-05-17 – 2021-05-18 (×3): 1 via ORAL

## 2021-05-16 MED ORDER — ENSURE ENLIVE PO LIQD
237.0000 mL | ORAL | Status: DC
  Administered 2021-05-17: 237 mL via ORAL

## 2021-05-16 MED ORDER — ORAL CARE MOUTH RINSE
15.0000 mL | Freq: Two times a day (BID) | OROMUCOSAL | Status: DC
  Administered 2021-05-16 – 2021-05-19 (×6): 15 mL via OROMUCOSAL

## 2021-05-16 MED ORDER — POTASSIUM CHLORIDE CRYS ER 20 MEQ PO TBCR
40.0000 meq | EXTENDED_RELEASE_TABLET | Freq: Once | ORAL | Status: AC
  Administered 2021-05-16: 40 meq via ORAL
  Filled 2021-05-16: qty 2

## 2021-05-16 MED ORDER — LIP MEDEX EX OINT: TOPICAL_OINTMENT | CUTANEOUS | Status: DC | PRN

## 2021-05-16 MED ORDER — PROSOURCE PLUS PO LIQD
30.0000 mL | Freq: Two times a day (BID) | ORAL | Status: DC
  Administered 2021-05-16 – 2021-05-19 (×6): 30 mL via ORAL
  Filled 2021-05-16 (×5): qty 30

## 2021-05-16 MED ORDER — CHLORHEXIDINE GLUCONATE 0.12 % MT SOLN
15.0000 mL | Freq: Two times a day (BID) | OROMUCOSAL | Status: DC
  Administered 2021-05-17 – 2021-05-19 (×5): 15 mL via OROMUCOSAL
  Filled 2021-05-16 (×6): qty 15

## 2021-05-16 NOTE — Progress Notes (Signed)
PROGRESS NOTE    [name]  AXE:[id number] DOB: 04/06/1986 DOA: 05/07/2021 PCP: [name], MD   Chief Complaint  Patient presents with   Leg Swelling    Brief Narrative: Patient is a 35 year old male with history of HIV, PTSD, schizoaffective disorder, seizures, heavy alcohol abuse, hypertension was brought to ED via EMS for altered mental status.  I was unable to obtain any history from the patient due to his mental status (whispering and rambling).  He was able to tell me his name.  Per EMS and EDP records, patient lives alone.  Patient's family called EMS because he was not taking his medications.  He was laying on the couch for the last 2 weeks, covered in feces and urine.  Patient was surrounded by many alcohol bottles.  He was also noted to have increased lower extremity swelling, sores and redness to the feet.   Interval summary 7/30-8/2   Patient was admitted with altered mental status, found on the couch covered in feces and urine, alcohol bottles Received high-dose IV thiamine for 3 days, completed. Patient has extensive cellulitis and wounds of the bilateral buttocks, posterior thighs with purulent and bloody drainage.  Placed on IV vancomycin and Rocephin. General surgery was consulted, CT scans showed drainable abscess.   Blood cultures positive for GPC in clusters, continue current antibiotics.  ID consulted   Plan to pursue LTAC for wound care,  antibiotics.   Assessment & Plan:   Principal Problem:   Acute metabolic encephalopathy Active Problems:   Schizoaffective disorder, depressive type (HCC)   Severe alcohol dependence (HCC)   GERD (gastroesophageal reflux disease)   Elevated LFTs   HIV disease (HCC)   Anemia   Cellulitis   Left hip pain   Transaminitis   1 acute metabolic encephalopathy -Questionable etiology. -Could possibly be secondary to Wernicke's encephalopathy/Korsakoff syndrome versus hepatic encephalopathy versus secondary to  infectious etiology/cellulitis.  Patient noted to have underlying psychiatric history. -CT head done negative for any acute abnormalities except increase fluid in the right ethmoid air cells. -Ammonia level at 30, TSH 1.1, B1 pending.,  Vitamin B12 at 2821. -Patient noted to have completed high-dose thiamine 500 mg 3 times daily x3 days, currently on thiamine 250 mg daily. -Mental status improved and likely close to baseline.   -Continue current dysphagia diet as recommended by SLP. -Patient was being followed by psychiatry who have signed off.  2.  Cellulitis bilateral lower extremity wounds, sacral/gluteal/thigh wound, POA -Patient noted to have some lower extremity edema felt likely secondary to severe hypoalbuminemia and third spacing. -2D echo with normal EF of 60 to 11%, grade 1 diastolic dysfunction. -Lower extremity Dopplers negative for DVT. -IV antibiotics of vancomycin and Rocephin were narrowed to IV Unasyn and subsequently transitioned to oral Augmentin per ID to complete course of treatment.  -General surgery was following however signed off but they state no drainable abscess noted. -ID was following but have signed off.   3.  Micrococcus bacteremia  -Repeat blood cultures pending with no growth to date x4 days. -Was on IV vancomycin and transitioned to IV Unasyn and subsequently transitioned to oral Augmentin per ID to complete course of antibiotic treatment.    4.  Acute on chronic anemia/microcytic anemia/thrombocytopenia -Likely alcohol induced. -FOBT negative 05/07/2021, 05/15/2021 -Anemia panel consistent with anemia of chronic disease. -Patient noted to have some bleeding from wound on posterior thighs, gluteal and sacral area. -Status post transfusion 3 units packed red blood cells . -Hemoglobin was  6.7 on 05/15/2021 status post transfusion of 2 units packed red blood cells with hemoglobin currently at 9.3 this morning.   -Patient noted to have had EGD done 02/2021 with  gastric ulcers noted and patient placed on PPI twice daily with recommendations for repeat EGD in 3 months to follow-up on healing. -Patient seen in consultation by GI who discussed with patient and seems as if patient does not want to have a repeat EGD at this time and would like to wait a few days before deciding on repeat EGD. -Supportive care. -Transfusion threshold hemoglobin < 7.  -Appreciate GI input and recommendations.  5. transaminitis with elevated alk phosphatase, AST, thrombocytopenia -Likely alcohol induced. -Abdominal ultrasound negative for cholelithiasis or biliary duct dilatation however consistent with hepatic steatosis. -LFTs improving. -Outpatient follow-up.  6.  Acute kidney injury -Likely secondary to prerenal azotemia in the setting of poor oral intake and intravascular depletion. -Improved with hydration.   -Follow.   7.  Hypokalemia/hypomagnesemia -Potassium at 3.4.  Magnesium at 2.1.   -Replete potassium.   -Repeat labs in the morning.    8.  History of alcohol abuse -No signs of withdrawal noted. -Status post high-dose thiamine.   -Continue thiamine, folic acid.   -Ativan withdrawal protocol has been discontinued.   9.  HIV disease -Patient noted to be noncompliant with antiretrovirals. -Continue valacyclovir, Biktarvy.   -Outpatient follow-up with ID  10.  Schizoaffective disorder, depressive disorder -Stable.   -Continue current regimen of Lexapro, Latuda, Xanax.   -Psychiatry was following but have signed off.   -Outpatient follow-up with psychiatry.   11.  GERD -PPI twice daily.  12.  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 -Plain films of the left hip showed chronic and absent fracture of left femoral head similar to prior exam. -Patient noted to have been scheduled for surgery on 05/18/2021 with orthopedics, Dr.  Alvan Dame for total hip arthroplasty. -Patient has been seen in consultation by orthopedics who feel patient is at significant high risk for surgery based on medical comorbidities and now recommending rescheduling surgery once issues have been addressed. -Outpatient follow-up with orthopedics. -PT/OT   DVT prophylaxis: SCDs Code Status: Full Family Communication: No family at bedside. Disposition:   Status is: Inpatient  Remains inpatient appropriate because:IV treatments appropriate due to intensity of illness or inability to take PO  Dispo: The patient is from: LTAC versus SNF              Anticipated d/c is to: Home              Patient currently is not medically stable to d/c.   Difficult to place patient No       Consultants:  Psychiatry: Dr. Dwyane Dee 05/10/2021 Wound care RN 05/10/2021 General surgery: Dr. Redmond Pulling 05/11/2021 Infectious disease: Dr. Juleen China 05/11/2021  Orthopedics: Costella Hatcher, PA 05/12/2021 GI: Dr. Alessandra Bevels 05/16/2021  Procedures:  CT pelvis 05/10/2021 CT head 05/07/2021 Plain films of the pelvis and left hip 05/08/2021 Chest x-ray 05/07/2021 Abdominal ultrasound 05/07/2021 2D echo 05/08/2021 Lower extremity Doppler 05/08/2021 Transfusing 2 units packed red blood cells 05/15/2021 Transfusion of 3 units PRBCs 7/31, 8/ 1, 8/ 2.  Antimicrobials:  IV Rocephin 05/07/2021>>>>> 05/14/2021 IV vancomycin 05/08/2021>>>>>>> 05/13/2021 IV Unasyn 05/13/2021>>>>> 05/14/2021 Augmentin 05/14/2021>>>>>> 05/22/2021   Subjective: Awake, alert.  Feeling better today.  No chest pain.  No shortness of breath.  No abdominal pain.  Somewhat hesitant to go to rehab however agreeable.   Objective: Vitals:   05/15/21 2124 05/15/21 2125 05/16/21 0557 05/16/21 0744  BP: 115/68 115/68 (!) 141/77   Pulse: (!) 102 (!) 102 86   Resp: 18 18 20    Temp: 98 F (36.7 C) 98 F (36.7 C) 99.1 F (37.3 C)   TempSrc: Oral Oral Oral   SpO2: 100% 96% 97% 95%  Weight:      Height:        Intake/Output Summary  (Last 24 hours) at 05/16/2021 1356 Last data filed at 05/16/2021 1030 Gross per 24 hour  Intake 1406 ml  Output --  Net 1406 ml    Filed Weights   05/08/21 1300  Weight: 109.1 kg    Examination:  General exam: NAD Respiratory system: Decreased coarse breath sounds.  No wheezing.  Fair air movement.  Speaking in full sentences.  Cardiovascular system: Regular rate rhythm no murmurs rubs or gallops.  No JVD.  No lower extremity edema. Gastrointestinal system: Abdomen is soft, nontender, nondistended, positive bowel sounds.  No rebound.  No guarding.  Central nervous system: Alert and oriented.  Moving extremities spontaneously.  No focal neurological deficits.  Extremities: Dressing bilateral outer thighs.   Skin: No rashes, lesions or ulcers Psychiatry: Judgement and insight appear fair.  Mood & affect appropriate.     Data Reviewed: I have personally reviewed following labs and imaging studies  CBC: Recent Labs  Lab 05/12/21 1019 05/13/21 0520 05/14/21 0457 05/15/21 0801 05/15/21 2336  WBC 14.5* 15.3* 16.3* 14.0* 15.3*  NEUTROABS 10.8* 10.8* 11.6* 9.1*  --   HGB 8.2* 8.3* 7.8* 6.7* 9.3*  HCT 26.4* 26.9* 25.7* 22.1* 29.4*  MCV 102.3* 103.9* 106.6* 104.7* 100.0  PLT 126* 141* 151 158 185     Basic Metabolic Panel: Recent Labs  Lab 05/12/21 1019 05/13/21 0520 05/14/21 0457 05/15/21 0801 05/16/21 0916  NA 139 139 137 136 140  K 2.9* 3.9 3.2* 3.6 3.4*  CL 107 111 111 107 108  CO2 24 24 23 24 27   GLUCOSE 110* 88 117* 82 81  BUN 12 11 10 10 9   CREATININE 1.51* 1.28* 1.29* 1.30* 1.32*  CALCIUM 7.7* 7.9* 7.9* 7.7* 8.1*  MG 1.3* 2.0  --  1.6* 2.1  PHOS 2.6  --   --   --   --      GFR: Estimated Creatinine Clearance: 102.2 mL/min (A) (by C-G formula based on SCr of 1.32 mg/dL (H)).  Liver Function Tests: Recent Labs  Lab 05/10/21 0416 05/11/21 0429 05/12/21 1019  AST 42* 41 32  ALT 31 30 28   ALKPHOS 163* 165* 192*  BILITOT 1.1 1.1 0.8  PROT 6.6 6.2* 6.9   ALBUMIN 1.6* 1.4* 1.5*     CBG: No results for input(s): GLUCAP in the last 168 hours.   Recent Results (from the past 240 hour(s))  Resp Panel by RT-PCR (Flu A&B, Covid) Nasopharyngeal Swab     Status: None   Collection Time: 05/07/21  2:48 PM   Specimen: Nasopharyngeal Swab; Nasopharyngeal(NP) swabs in vial transport medium  Result Value Ref Range Status   SARS Coronavirus 2 by RT PCR NEGATIVE NEGATIVE Final    Comment: (NOTE) SARS-CoV-2 target nucleic acids are NOT DETECTED.  The SARS-CoV-2 RNA is generally detectable in upper respiratory specimens during the acute phase of infection. The lowest concentration of SARS-CoV-2 viral copies this assay can detect is 138 copies/mL. A negative result does not  preclude SARS-Cov-2 infection and should not be used as the sole basis for treatment or other patient management decisions. A negative result may occur with  improper specimen collection/handling, submission of specimen other than nasopharyngeal swab, presence of viral mutation(s) within the areas targeted by this assay, and inadequate number of viral copies(<138 copies/mL). A negative result must be combined with clinical observations, patient history, and epidemiological information. The expected result is Negative.  Fact Sheet for Patients:  EntrepreneurPulse.com.au  Fact Sheet for Healthcare Providers:  IncredibleEmployment.be  This test is no t yet approved or cleared by the [place] FDA and  has been authorized for detection and/or diagnosis of SARS-CoV-2 by FDA under an Emergency Use Authorization (EUA). This EUA will remain  in effect (meaning this test can be used) for the duration of the COVID-19 declaration under Section 564(b)(1) of the Act, 21 U.S.C.section 360bbb-3(b)(1), unless the authorization is terminated  or revoked sooner.       Influenza A by PCR NEGATIVE NEGATIVE Final   Influenza B by PCR NEGATIVE  NEGATIVE Final    Comment: (NOTE) The Xpert Xpress SARS-CoV-2/FLU/RSV plus assay is intended as an aid in the diagnosis of influenza from Nasopharyngeal swab specimens and should not be used as a sole basis for treatment. Nasal washings and aspirates are unacceptable for Xpert Xpress SARS-CoV-2/FLU/RSV testing.  Fact Sheet for Patients: EntrepreneurPulse.com.au  Fact Sheet for Healthcare Providers: IncredibleEmployment.be  This test is not yet approved or cleared by the [place] FDA and has been authorized for detection and/or diagnosis of SARS-CoV-2 by FDA under an Emergency Use Authorization (EUA). This EUA will remain in effect (meaning this test can be used) for the duration of the COVID-19 declaration under Section 564(b)(1) of the Act, 21 U.S.C. section 360bbb-3(b)(1), unless the authorization is terminated or revoked.  Performed at [facility], [place] [address], [place], [place] [address]   Urine Culture     Status: Abnormal   Collection Time: 05/07/21  5:02 PM   Specimen: Urine, Clean Catch  Result Value Ref Range Status   Specimen Description   Final    URINE, CLEAN CATCH Performed at [facility], [place] [address], [place], [place] [address]    Special Requests   Final    NONE Performed at [facility], [place] [address], [place], [place] [address]    Culture MULTIPLE SPECIES PRESENT, SUGGEST RECOLLECTION (A)  Final   Report Status 05/09/2021 FINAL  Final  Culture, blood (Routine X 2) w Reflex to ID Panel     Status: Abnormal   Collection Time: 05/07/21  5:03 PM   Specimen: BLOOD  Result Value Ref Range Status   Specimen Description   Final    BLOOD LEFT ANTECUBITAL Performed at [place] [address] [place], [place] [address]    Special Requests   Final    BOTTLES DRAWN AEROBIC AND ANAEROBIC Blood Culture adequate volume Performed at [place] [address], [place], [place] [address]    Culture  Setup Time   Final    GRAM POSITIVE COCCI IN CLUSTERS AEROBIC BOTTLE ONLY CRITICAL VALUE NOTED.  VALUE IS CONSISTENT WITH PREVIOUSLY REPORTED AND CALLED VALUE.    Culture (A)  Final    MICROCOCCUS SPECIES Standardized susceptibility testing for this organism is not available. Performed at [facility], [place] [address], [place], [place] [address]    Report Status 05/12/2021 FINAL  Final  Culture,  blood (Routine X 2) w Reflex to ID Panel     Status: Abnormal   Collection Time: 05/07/21  5:08 PM   Specimen: BLOOD  Result Value Ref Range Status   Specimen Description   Final    BLOOD BLOOD LEFT FOREARM Performed at [place] [address], [place], [place] [address]    Special Requests   Final    BOTTLES DRAWN AEROBIC ONLY Blood Culture results may not be optimal due to an inadequate volume of blood received in culture bottles Performed at [place] [address], [place], [place] [address]    Culture  Setup Time   Final    GRAM POSITIVE COCCI IN CLUSTERS AEROBIC BOTTLE ONLY CRITICAL RESULT CALLED TO, READ BACK BY AND VERIFIED WITH: PHARM D J.GADHIA ON [id number] AT [id number] BY E.PARRISH    Culture (A)  Final    MICROCOCCUS SPECIES Standardized susceptibility testing for this organism is not available. Performed at [facility], [place] [address] [place], [place] [address]    Report Status 05/12/2021 FINAL  Final  Blood Culture ID Panel (Reflexed)     Status: None   Collection Time: 05/07/21  5:08 PM  Result Value Ref Range Status   Enterococcus faecalis NOT DETECTED NOT DETECTED Final   Enterococcus Faecium NOT DETECTED NOT DETECTED Final   Listeria monocytogenes NOT DETECTED NOT DETECTED Final   Staphylococcus species NOT DETECTED NOT DETECTED Final   Staphylococcus aureus (BCID) NOT DETECTED NOT DETECTED Final   Staphylococcus epidermidis NOT  DETECTED NOT DETECTED Final   Staphylococcus lugdunensis NOT DETECTED NOT DETECTED Final   Streptococcus species NOT DETECTED NOT DETECTED Final   Streptococcus agalactiae NOT DETECTED NOT DETECTED Final   Streptococcus pneumoniae NOT DETECTED NOT DETECTED Final   Streptococcus pyogenes NOT DETECTED NOT DETECTED Final   A.calcoaceticus-baumannii NOT DETECTED NOT DETECTED Final   Bacteroides fragilis NOT DETECTED NOT DETECTED Final   Enterobacterales NOT DETECTED NOT DETECTED Final   Enterobacter cloacae complex NOT DETECTED NOT DETECTED Final   Escherichia coli NOT DETECTED NOT DETECTED Final   Klebsiella aerogenes NOT DETECTED NOT DETECTED Final   Klebsiella oxytoca NOT DETECTED NOT DETECTED Final   Klebsiella pneumoniae NOT DETECTED NOT DETECTED Final   Proteus species NOT DETECTED NOT DETECTED Final   Salmonella species NOT DETECTED NOT DETECTED Final   Serratia marcescens NOT DETECTED NOT DETECTED Final   Haemophilus influenzae NOT DETECTED NOT DETECTED Final   Neisseria meningitidis NOT DETECTED NOT DETECTED Final   Pseudomonas aeruginosa NOT DETECTED NOT DETECTED Final   Stenotrophomonas maltophilia NOT DETECTED NOT DETECTED Final   Candida albicans NOT DETECTED NOT DETECTED Final   Candida auris NOT DETECTED NOT DETECTED Final   Candida glabrata NOT DETECTED NOT DETECTED Final   Candida krusei NOT DETECTED NOT DETECTED Final   Candida parapsilosis NOT DETECTED NOT DETECTED Final   Candida tropicalis NOT DETECTED NOT DETECTED Final   Cryptococcus neoformans/gattii NOT DETECTED NOT DETECTED Final    Comment: Performed at [facility], [address], [place], [place] [address]  Culture, blood (routine x 2)     Status: None (Preliminary result)   Collection Time: 05/12/21  9:30 AM   Specimen: BLOOD  Result Value Ref Range Status   Specimen Description   Final    BLOOD RIGHT ANTECUBITAL Performed at [place] [address] [place], [place]  [address]    Special Requests   Final  BOTTLES DRAWN AEROBIC AND ANAEROBIC Blood Culture adequate volume Performed at [place] [address], [place], [place] [address]    Culture   Final    NO GROWTH 4 DAYS Performed at [facility], [place] [address], [place], [place] [address]    Report Status PENDING  Incomplete  Culture, blood (routine x 2)     Status: None (Preliminary result)   Collection Time: 05/12/21  9:30 AM   Specimen: BLOOD  Result Value Ref Range Status   Specimen Description   Final    BLOOD RT HAND Performed at [place] [address] [place], [place] [address]    Special Requests   Final    BOTTLES DRAWN AEROBIC ONLY Blood Culture adequate volume Performed at [place] [address], [place], [place] [address]    Culture   Final    NO GROWTH 4 DAYS Performed at [facility], [place] [address], [place], [place] [address]    Report Status PENDING  Incomplete          Radiology Studies: No results found.      Scheduled Meds:  ALPRAZolam  2 mg Oral TID   amoxicillin-clavulanate  1 tablet Oral Q12H   bictegravir-emtricitabine-tenofovir AF  1 tablet Oral Daily   chlorhexidine  15 mL Mouth Rinse BID   escitalopram  20 mg Oral Daily   famotidine  40 mg Oral Daily   fluticasone  2 spray Each Nare Daily   folic acid  1 mg Oral Daily   Gerhardt's butt cream   Topical BID   guaiFENesin  1,200 mg Oral BID   ipratropium-albuterol  3 mL Nebulization BID   loratadine  10 mg Oral Daily   lurasidone  120 mg Oral q1800   magnesium oxide  400 mg Oral BID   mouth rinse  15 mL Mouth Rinse q12n4p   multivitamin with minerals  1 tablet Oral Daily   nicotine  21 mg Transdermal Daily   oxyCODONE-acetaminophen  1 tablet Oral Once   pantoprazole  40 mg Oral BID   potassium chloride  40 mEq Oral Once   thiamine  250 mg Oral Daily   valACYclovir  1,000 mg Oral Daily   Continuous  Infusions:     LOS: 9 days    Time spent: 40 minutes    Irine Seal, MD [facility]   To contact the attending provider between 7A-7P or the covering provider during after hours 7P-7A, please log into the web site [email or web address] and access using universal [place] password for that web site. If you do not have the password, please call the hospital operator.  05/16/2021, 1:56 PM

## 2021-05-16 NOTE — Consult Note (Signed)
Referring Provider:  [facility] Primary Care Physician:  [name], MD Primary Gastroenterologist:  Dr. Alessandra Bevels  Reason for Consultation:  Anemia  HPI: [name] is a 35 y.o. male with past medical history of HIV, PTSD, seizure disorder and schizoaffective disorder, history of alcohol use brought into the hospital with altered mental status on May 07, 2021.  He was found to have cellulitis of lower extremity as well as posterior thighs.  Was seen by wound care team.  Was found to have cellulitis which was draining heavy amount of bloody purulent fluid.  He was seen by surgery.  CT pelvis showed extensive soft tissue edema around the posterior gluteal soft tissue and proximal thighs.  No organized abscess.  It also showed thickening versus fatty infiltration of the cecum.  He was also seen by infectious disease.  GI is consulted for evaluation of anemia.  Patient with history of chronic anemia.  Hemoglobin on admission was around 7.6.  Hemoglobin dropped to 6.2.  He received blood transfusion but again found to have a drop in hemoglobin to 6.7.  Hemoglobin improved to 9.3 now after blood transfusion.  Patient seen and examined at bedside.  Denies any acute GI issues at this time.  Denies seeing any blood in the stool or black stool but complaining of bleeding from cellulitis/wound.  He underwent EGD and colonoscopy in May 2022 for evaluation of anemia and heme positive stool.  EGD showed few gastric ulcers and scattered white plaques in esophagus.  Biopsies were negative for Candida esophagitis and H. pylori.  Repeat EGD was recommended in 3 months to document healing of ulcers.  Colonoscopy showed fair prep in the cecum and sigmoid colon, normal terminal ileum and no evidence of active bleeding.      Past Medical History:  Diagnosis Date   Bipolar 1 disorder ([place])    Depression    Dizziness and giddiness 02/01/2016   Herpes genitalia    HIV disease ([place])    Hypertension    Migraine  headache 02/01/2016   Peripheral neuropathy 10/01/2019   PTSD (post-traumatic stress disorder)    Schizoaffective disorder ([place])    Seizures ([place])     Past Surgical History:  Procedure Laterality Date   BACK SURGERY     BIOPSY  02/26/2021   Procedure: BIOPSY;  Surgeon: Otis Brace, MD;  Location: [facility];  Service: Gastroenterology;;   COLONOSCOPY WITH PROPOFOL [name] 02/26/2021   Procedure: COLONOSCOPY WITH PROPOFOL;  Surgeon: Otis Brace, MD;  Location: [facility];  Service: Gastroenterology;  Laterality: [name];   ESOPHAGOGASTRODUODENOSCOPY (EGD) WITH PROPOFOL [name] 02/26/2021   Procedure: ESOPHAGOGASTRODUODENOSCOPY (EGD) WITH PROPOFOL;  Surgeon: Otis Brace, MD;  Location: [facility];  Service: Gastroenterology;  Laterality: [name];   HAND SURGERY      Prior to Admission medications   Medication Sig Start Date End Date Taking? Authorizing Provider  bictegravir-emtricitabine-tenofovir AF (BIKTARVY) 50-200-25 MG TABS tablet Take 1 tablet by mouth daily.  02/24/17  Yes [provider]  albuterol (PROVENTIL HFA;VENTOLIN HFA) 108 (90 BASE) MCG/ACT inhaler Inhale 2 puffs into the lungs every 6 (six) hours as needed for wheezing or shortness of breath.    [provider]  alprazolam Duanne Moron) 2 MG tablet Take 2 mg by mouth 3 (three) times daily.    [provider]  amoxicillin-clavulanate (AUGMENTIN) 875-125 MG tablet Take 1 tablet by mouth every 12 (twelve) hours. 10/23/20   Hazel Sams, PA-C  benzonatate (TESSALON) 100 MG capsule Take 200 mg by mouth  every 8 (eight) hours as needed for cough. 10/23/19   [provider]  cetirizine (ZYRTEC) 10 MG tablet Take 1 tablet (10 mg total) by mouth daily. Patient taking differently: Take 10 mg by mouth daily as needed for allergies. 05/15/15   Niel Hummer, NP  Clindamycin-Benzoyl Per, Refr, gel Apply 1 application topically daily. 09/02/20   [provider]  diphenoxylate-atropine (LOMOTIL)  2.5-0.025 MG tablet Take 1 tablet by mouth every 8 (eight) hours as needed for diarrhea or loose stools. 02/16/21   [provider]  escitalopram (LEXAPRO) 20 MG tablet Take 1 tablet by mouth daily. 02/16/21   [provider]  famotidine (PEPCID) 40 MG tablet Take 40 mg by mouth daily.    [provider]  folic acid (FOLVITE) 1 MG tablet Take 1 mg by mouth daily. 10/03/19   [provider]  furosemide (LASIX) 40 MG tablet Take 40 mg by mouth daily as needed for fluid. 02/27/20   [provider]  haloperidol (HALDOL) 5 MG tablet Take 5 mg by mouth daily.  06/19/19   [provider]  hydrOXYzine (ATARAX/VISTARIL) 50 MG tablet Take 1 tablet (50 mg total) by mouth every 6 (six) hours as needed for anxiety. 07/25/18   Pennelope Bracken, MD  lurasidone 120 MG TABS Take 1 tablet (120 mg total) by mouth daily. 05/15/15   Niel Hummer, NP  meclizine (ANTIVERT) 25 MG tablet Take 25 mg by mouth every 8 (eight) hours as needed for dizziness.  03/02/20   [provider]  ondansetron (ZOFRAN) 8 MG tablet Take 8 mg by mouth every 8 (eight) hours as needed for nausea/vomiting, nausea or vomiting. 10/03/19   [provider]  pantoprazole (PROTONIX) 40 MG tablet Take 1 tablet (40 mg total) by mouth 2 (two) times daily. 02/26/21   Otis Brace, MD  thiamine (VITAMIN B-1) 100 MG tablet Take 100 mg by mouth daily.  01/31/19   [provider]  topiramate (TOPAMAX) 100 MG tablet TAKE 1 TABLET (100 MG TOTAL) BY MOUTH AT BEDTIME. 03/18/21   Suzzanne Cloud, NP  traZODone (DESYREL) 300 MG tablet Take 1 tablet (300 mg total) by mouth at bedtime as needed for sleep. Patient taking differently: Take 1,200 mg by mouth at bedtime. 05/15/15   Niel Hummer, NP  valACYclovir (VALTREX) 1000 MG tablet Take 1 tablet (1,000 mg total) by mouth daily. 05/15/15   Niel Hummer, NP  zolpidem (AMBIEN) 10 MG tablet Take 10 mg by mouth at bedtime. 09/06/19    [provider]    Scheduled Meds:  ALPRAZolam  2 mg Oral TID   amoxicillin-clavulanate  1 tablet Oral Q12H   bictegravir-emtricitabine-tenofovir AF  1 tablet Oral Daily   escitalopram  20 mg Oral Daily   famotidine  40 mg Oral Daily   fluticasone  2 spray Each Nare Daily   folic acid  1 mg Oral Daily   Gerhardt's butt cream   Topical BID   guaiFENesin  1,200 mg Oral BID   ipratropium-albuterol  3 mL Nebulization BID   loratadine  10 mg Oral Daily   LORazepam  0-4 mg Intravenous Q12H   lurasidone  120 mg Oral q1800   magnesium oxide  400 mg Oral BID   multivitamin with minerals  1 tablet Oral Daily   nicotine  21 mg Transdermal Daily   oxyCODONE-acetaminophen  1 tablet Oral Once   pantoprazole  40 mg Oral BID   thiamine  250  mg Oral Daily   valACYclovir  1,000 mg Oral Daily   Continuous Infusions: PRN Meds:.acetaminophen **OR** acetaminophen, albuterol, benzonatate, loperamide HCl, trazodone  Allergies as of 05/07/2021 - Review Complete 05/07/2021  Allergen Reaction Noted   Dapsone Other (See Comments) 09/08/2011   Primaquine phosphate Other (See Comments) 09/08/2011    Family History  Problem Relation Age of Onset   Alcohol abuse Mother    Schizophrenia Father    Depression Father    Alcohol abuse Father    Alcohol abuse Paternal Uncle    Alcohol abuse Paternal Uncle     Social History   Socioeconomic History   Marital status: Single    Spouse name: Not on file   Number of children: Not on file   Years of education: Not on file   Highest education level: Not on file  Occupational History   Occupation: [employer]  Tobacco Use   Smoking status: Every Day    Packs/day: 1.00    Types: Cigarettes   Smokeless tobacco: Never  Vaping Use   Vaping Use: Never used  Substance and Sexual Activity   Alcohol use: Yes    Alcohol/week: 14.0 standard drinks    Types: 14 Cans of beer per week    Comment: 5-6 40oz per day   Drug use: No   Sexual activity: Not  Currently    Birth control/protection: Condom  Other Topics Concern   Not on file  Social History Narrative   Lives at home alone   Right-handed   Drinks 2-3 sodas per day   Social Determinants of Health   Financial Resource Strain: Not on file  Food Insecurity: Not on file  Transportation Needs: Not on file  Physical Activity: Not on file  Stress: Not on file  Social Connections: Not on file  Intimate Partner Violence: Not on file    Review of Systems: Limited review of system negative except as mentioned in HPI  Physical Exam: Vital signs: Vitals:   05/16/21 0557 05/16/21 0744  BP: (!) 141/77   Pulse: 86   Resp: 20   Temp: 99.1 F (37.3 C)   SpO2: 97% 95%   Last BM Date: 05/15/21 General:   Alert,  Well-developed, well-nourished, pleasant and cooperative in NAD Lungs: No visible respiratory distress noted Heart:  Regular rate and rhythm; no murmurs, clicks, rubs,  or gallops. Abdomen: Soft, nontender, nondistended, bowel sounds present.  No peritoneal signs Rectal:  Deferred  GI:  Lab Results: Recent Labs    05/14/21 0457 05/15/21 0801 05/15/21 2336  WBC 16.3* 14.0* 15.3*  HGB 7.8* 6.7* 9.3*  HCT 25.7* 22.1* 29.4*  PLT 151 158 185   BMET Recent Labs    05/14/21 0457 05/15/21 0801  NA 137 136  K 3.2* 3.6  CL 111 107  CO2 23 24  GLUCOSE 117* 82  BUN 10 10  CREATININE 1.29* 1.30*  CALCIUM 7.9* 7.7*   LFT No results for input(s): PROT, ALBUMIN, AST, ALT, ALKPHOS, BILITOT, BILIDIR, IBILI in the last 72 hours. PT/INR No results for input(s): LABPROT, INR in the last 72 hours.   Studies/Results: No results found.  He underwent EGD and colonoscopy in May 2022 for evaluation of anemia and heme positive stool.  EGD showed few gastric ulcers and scattered white plaques in esophagus.  Biopsies were negative for Candida esophagitis and H. pylori.  Repeat EGD was recommended in 3 months to document healing of ulcers.  Colonoscopy 02/26/2021 showed fair  prep in the cecum  and sigmoid colon, normal terminal ileum and no evidence of active bleeding.  Impression/Plan: -Acute on chronic anemia.  No overt bleeding.  Slow blood loss could be from bleeding from posterior thigh wounds/cellulitis -History of gastric ulcers. -Abnormal CT scan concerning for thickening versus fatty infiltration of the cecum. Colonoscopy 02/26/2021 showed fair prep in the cecum and sigmoid colon, normal terminal ileum and no evidence of active bleeding.  Recommendations ------------------------- -Continue supportive care for now -Patient does not want to have a repeat EGD at this time.  He would like to wait for few days before deciding on repeat EGD. -Continue twice daily PPI -Monitor H&H.  Repeat LFTs in the morning -GI will follow    LOS: 9 days   Otis Brace  MD, FACP 05/16/2021, 9:36 AM  Contact #  [phone]

## 2021-05-16 NOTE — Progress Notes (Signed)
Initial Nutrition Assessment RD working remotely.  DOCUMENTATION CODES:   Not applicable  INTERVENTION:  - will order Boost Breeze BID, each supplement provides 250 kcal and 9 grams of protein. - will order Ensure Enlive once/day, each supplement provides 350 kcal and 20 grams of protein. - will order 30 ml Prosource Plus BID, each supplement provides 100 kcal and 15 grams protein.  - complete NFPE when feasible.  - weigh patient today.    NUTRITION DIAGNOSIS:   Inadequate oral intake related to acute illness, social / environmental circumstances as evidenced by meal completion < 50%.  GOAL:   Patient will meet greater than or equal to 90% of their needs  MONITOR:   PO intake, Supplement acceptance, Labs, Weight trends  REASON FOR ASSESSMENT:   Malnutrition Screening Tool  ASSESSMENT:   35 year old male with medical history of HIV, PTSD, schizoaffective disorder, seizures, heavy alcohol abuse, and HTN. He presented to the ED via EMS due to AMS. He lives alone and his family called EMS because he was not taking his medications and was noted to have been laying on the couch x2 weeks, covered in feces and urine, and surrounded by alcohol bottles. He was noted the have increased BLE swelling, sored, and redness to his feet.  Patient is now a/o x4 but noted to continue to have poor safety awareness and delayed responses with slurred speech.   Diet advanced from CLD to Dysphagia 3, thin liquids on 7/30 at 1108 and to Regular on 8/1 at 0853.  He has been eating variably at meals: 20-90% over the past 5 days.   Weight on 7/30 was 240.5 lb and he has not been weighed since that time. Weight on 02/26/21 was 249.5 lb. This indicates 9 lb weight loss (3.6% body weight) in a 2 month span; not significant.   He has not been seen by a [place] RD at any time in the past.   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 Medications reviewed; 40 mg oral pepcid/day, 1 mg folvite/day, 400 mg mag-ox BID, 4 g IV Mg sulfate x1 run 8/6, 1 tablet multivitamin with minerals/day, 40 mg oral protonix BID, 40 mEq Klor-Con x1 dose 8/7, 250 mg thiamine/day.    NUTRITION - FOCUSED PHYSICAL EXAM:  Unable to complete at this time.   Diet Order:   Diet Order             Diet regular Room service appropriate? Yes; Fluid consistency: Thin  Diet effective now                   EDUCATION NEEDS:   No education needs have been identified at this time  Skin:  Skin Assessment: Skin Integrity Issues: Skin Integrity Issues:: Other (Comment) Other: non-pressure wounds to R thigh x3; non-pressure wounds to L thigh x2  Last BM:  8/6 (type 6 x1)  Height:   Ht Readings from Last 1 Encounters:  05/08/21 6' 3"  (1.905 m)    Weight:   Wt Readings from Last 1 Encounters:  05/08/21 109.1 kg  Estimated Nutritional Needs:  Kcal:  2400-2650 kcal Protein:  120-130 grams Fluid:  >/= 2.5 L/day       Jarome Matin, MS, RD, LDN, CNSC Inpatient Clinical Dietitian RD pager # available in [facility]  After hours/weekend pager # available in [facility]

## 2021-05-17 LAB — CBC WITH DIFFERENTIAL/PLATELET
Abs Immature Granulocytes: 0.24 10*3/uL — ABNORMAL HIGH (ref 0.00–0.07)
Basophils Absolute: 0.1 10*3/uL (ref 0.0–0.1)
Basophils Relative: 0 %
Eosinophils Absolute: 0.3 10*3/uL (ref 0.0–0.5)
Eosinophils Relative: 2 %
HCT: 28.2 % — ABNORMAL LOW (ref 39.0–52.0)
Hemoglobin: 8.9 g/dL — ABNORMAL LOW (ref 13.0–17.0)
Immature Granulocytes: 2 %
Lymphocytes Relative: 19 %
Lymphs Abs: 2.7 10*3/uL (ref 0.7–4.0)
MCH: 31.8 pg (ref 26.0–34.0)
MCHC: 31.6 g/dL (ref 30.0–36.0)
MCV: 100.7 fL — ABNORMAL HIGH (ref 80.0–100.0)
Monocytes Absolute: 1.1 10*3/uL — ABNORMAL HIGH (ref 0.1–1.0)
Monocytes Relative: 8 %
Neutro Abs: 9.9 10*3/uL — ABNORMAL HIGH (ref 1.7–7.7)
Neutrophils Relative %: 69 %
Platelets: 210 10*3/uL (ref 150–400)
RBC: 2.8 MIL/uL — ABNORMAL LOW (ref 4.22–5.81)
RDW: 19.1 % — ABNORMAL HIGH (ref 11.5–15.5)
WBC: 14.3 10*3/uL — ABNORMAL HIGH (ref 4.0–10.5)
nRBC: 0 % (ref 0.0–0.2)

## 2021-05-17 LAB — BASIC METABOLIC PANEL
Anion gap: 6 (ref 5–15)
BUN: 9 mg/dL (ref 6–20)
CO2: 26 mmol/L (ref 22–32)
Calcium: 8.6 mg/dL — ABNORMAL LOW (ref 8.9–10.3)
Chloride: 109 mmol/L (ref 98–111)
Creatinine, Ser: 1.26 mg/dL — ABNORMAL HIGH (ref 0.61–1.24)
GFR, Estimated: >60 mL/min (ref >60)
Glucose, Bld: 87 mg/dL (ref 70–99)
Potassium: 4.1 mmol/L (ref 3.5–5.1)
Sodium: 141 mmol/L (ref 135–145)

## 2021-05-17 LAB — HEPATIC FUNCTION PANEL
ALT: 17 U/L (ref 0–44)
AST: 19 U/L (ref 15–41)
Albumin: 1.4 g/dL — ABNORMAL LOW (ref 3.5–5.0)
Alkaline Phosphatase: 142 U/L — ABNORMAL HIGH (ref 38–126)
Bilirubin, Direct: 0.2 mg/dL (ref 0.0–0.2)
Indirect Bilirubin: 0.2 mg/dL — ABNORMAL LOW (ref 0.3–0.9)
Total Bilirubin: 0.4 mg/dL (ref 0.3–1.2)
Total Protein: 7.7 g/dL (ref 6.5–8.1)

## 2021-05-17 LAB — MAGNESIUM: Magnesium: 1.8 mg/dL (ref 1.7–2.4)

## 2021-05-17 MED ORDER — SODIUM CHLORIDE 0.9 % IV SOLN: INTRAVENOUS | Status: DC

## 2021-05-17 NOTE — Anesthesia Preprocedure Evaluation (Addendum)
Anesthesia Evaluation  Patient identified by MRN, date of birth, ID band Patient awake    Reviewed: Allergy & Precautions, NPO status , Patient's Chart, lab work & pertinent test results  History of Anesthesia Complications Negative for: history of anesthetic complications  Airway Mallampati: II  TM Distance: >3 FB Neck ROM: Full    Dental  (+) Dental Advisory Given   Pulmonary Current Smoker and Patient abstained from smoking.,    Pulmonary exam normal        Cardiovascular hypertension, Normal cardiovascular exam     Neuro/Psych  Headaches, Seizures -, Well Controlled,  PSYCHIATRIC DISORDERS Anxiety Depression Bipolar Disorder  Schizoaffective d/o SI  Neuromuscular disease    GI/Hepatic GERD  Medicated and Controlled,(+)     substance abuse  alcohol use,   Endo/Other  negative endocrine ROS  Renal/GU negative Renal ROS     Musculoskeletal negative musculoskeletal ROS (+)   Abdominal   Peds  Hematology  (+) anemia , HIV,   Anesthesia Other Findings HSV   Reproductive/Obstetrics                            Anesthesia Physical Anesthesia Plan  ASA: 3  Anesthesia Plan: MAC   Post-op Pain Management:    Induction:   PONV Risk Score and Plan: 1 and Propofol infusion and Treatment may vary due to age or medical condition  Airway Management Planned: Nasal Cannula and Natural Airway  Additional Equipment: None  Intra-op Plan:   Post-operative Plan:   Informed Consent: I have reviewed the patients History and Physical, chart, labs and discussed the procedure including the risks, benefits and alternatives for the proposed anesthesia with the patient or authorized representative who has indicated his/her understanding and acceptance.       Plan Discussed with: CRNA and Anesthesiologist  Anesthesia Plan Comments:        Anesthesia Quick Evaluation

## 2021-05-17 NOTE — Progress Notes (Signed)
PROGRESS NOTE    [name]  NAT:[id number] DOB: 10-25-1985 DOA: 05/07/2021 PCP: [name], MD   Chief Complaint  Patient presents with   Leg Swelling    Brief Narrative: Patient is a 35 year old male with history of HIV, PTSD, schizoaffective disorder, seizures, heavy alcohol abuse, hypertension was brought to ED via EMS for altered mental status.  Patient's family called EMS because he was not taking his medications.  He was laying on the couch for the last 2 weeks, covered in feces and urine.  Patient was surrounded by many alcohol bottles.  He was also noted to have increased lower extremity swelling, sores and redness to the feet. Received high-dose IV thiamine for 3 days, completed. Patient has extensive cellulitis and wounds of the bilateral buttocks, posterior thighs with purulent and bloody drainage.  Placed on IV vancomycin and Rocephin. General surgery was consulted, CT scans showed drainable abscess.   Blood cultures positive for GPC in clusters, continue current antibiotics.  ID consulted   Plan to pursue LTAC for wound care,  antibiotics.   Assessment & Plan:   Principal Problem:   Acute metabolic encephalopathy Active Problems:   Schizoaffective disorder, depressive type (HCC)   Severe alcohol dependence (HCC)   GERD (gastroesophageal reflux disease)   Elevated LFTs   HIV disease (HCC)   Anemia   Cellulitis   Left hip pain   Transaminitis    acute metabolic encephalopathy -Questionable etiology. -Could possibly be secondary to Wernicke's encephalopathy/Korsakoff syndrome versus hepatic encephalopathy versus secondary to infectious etiology/cellulitis.  Patient noted to have underlying psychiatric history. -CT head done negative for any acute abnormalities except increase fluid in the right ethmoid air cells. -Ammonia level at 30, TSH 1.1, B1 pending.,  Vitamin B12 at 2821. -Patient noted to have completed high-dose thiamine 500 mg 3 times daily x3  days, currently on thiamine 250 mg daily. -Mental status improved and likely close to baseline.   -Patient was being followed by psychiatry who have signed off.  Cellulitis bilateral lower extremity wounds, sacral/gluteal/thigh wound, POA -Patient noted to have some lower extremity edema felt likely secondary to severe hypoalbuminemia and third spacing. -2D echo with normal EF of 60 to 42%, grade 1 diastolic dysfunction. -Lower extremity Dopplers negative for DVT. -IV antibiotics of vancomycin and Rocephin were narrowed to IV Unasyn and subsequently transitioned to oral Augmentin per ID to complete course of treatment.  -General surgery was following however signed off but they state no drainable abscess noted.  Micrococcus bacteremia  -Repeat blood cultures pending with no growth to date x4 days. -Was on IV vancomycin and transitioned to IV Unasyn and subsequently transitioned to oral Augmentin per ID to complete course of antibiotic treatment: Plan for 2 weeks of therapy 7/29-8/11to complete course of therapy for skin and soft tissue infection and possible uncomplicated bacteremia  Acute on chronic anemia/microcytic anemia/thrombocytopenia -Likely alcohol induced. -FOBT negative 05/07/2021, 05/15/2021 -Anemia panel consistent with anemia of chronic disease. -Patient noted to have some bleeding from wound on posterior thighs, gluteal and sacral area. -Status post transfusion 3 units packed red blood cells . -Hemoglobin was 6.7 on 05/15/2021 status post transfusion of 2 units packed red blood cells with hemoglobin currently at 9.3 this morning.   -Patient noted to have had EGD done 02/2021 with gastric ulcers noted and patient placed on PPI twice daily with recommendations for repeat EGD in 3 months to follow-up on healing. -Patient seen in consultation by GI: plan for EGD in AM  transaminitis with elevated alk phosphatase, AST, thrombocytopenia -Likely alcohol induced. -Abdominal ultrasound  negative for cholelithiasis or biliary duct dilatation however consistent with hepatic steatosis. -LFTs improving. -Outpatient follow-up.  Acute kidney injury -Likely secondary to prerenal azotemia in the setting of poor oral intake and intravascular depletion. -Improved with hydration.   -Follow.   Hypokalemia/hypomagnesemia -Potassium at 3.4.  Magnesium at 2.1.   -Replete potassium.   -Repeat labs in the morning.    History of alcohol abuse -No signs of withdrawal noted. -Status post high-dose thiamine.   -Continue thiamine, folic acid.   -Ativan withdrawal protocol has been discontinued.   HIV disease -Patient noted to be noncompliant with antiretrovirals. -Continue valacyclovir, Biktarvy.   -Outpatient follow-up with ID  Schizoaffective disorder, depressive disorder -Stable.   -Continue current regimen of Lexapro, Latuda, Xanax.   -Psychiatry was following but have signed off.   -Outpatient follow-up with psychiatry.    GERD -PPI twice daily.   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 -Plain films of the left hip showed chronic and absent fracture of left femoral head similar to prior exam. -Patient noted to have been scheduled for surgery on 05/18/2021 with orthopedics, Dr. Alvan Dame for total hip arthroplasty. -Patient has been seen in consultation by orthopedics who feel patient is at significant high risk for surgery based on medical comorbidities and now recommending rescheduling surgery once issues have been addressed. -Outpatient follow-up with orthopedics.    DVT prophylaxis: SCDs Code Status: Full Family Communication: No family at bedside. Disposition:   Status is: Inpatient  Remains inpatient appropriate because:IV treatments appropriate due to intensity of illness or inability to take PO  Dispo: The patient is from: LTAC versus  SNF vs home              Anticipated d/c is to: Home              Patient currently is not medically stable to d/c.   Difficult to place patient No       Consultants:  Psychiatry: Dr. Dwyane Dee 05/10/2021 Wound care RN 05/10/2021 General surgery: Dr. Redmond Pulling 05/11/2021 Infectious disease: Dr. Juleen China 05/11/2021  Orthopedics: Costella Hatcher, PA 05/12/2021 GI: Dr. Alessandra Bevels 05/16/2021  Procedures:  CT pelvis 05/10/2021 CT head 05/07/2021 Plain films of the pelvis and left hip 05/08/2021 Chest x-ray 05/07/2021 Abdominal ultrasound 05/07/2021 2D echo 05/08/2021 Lower extremity Doppler 05/08/2021 Transfusing 2 units packed red blood cells 05/15/2021 Transfusion of 3 units PRBCs 7/31, 8/ 1, 8/ 2.  Antimicrobials:  IV Rocephin 05/07/2021>>>>> 05/14/2021 IV vancomycin 05/08/2021>>>>>>> 05/13/2021 IV Unasyn 05/13/2021>>>>> 05/14/2021 Augmentin 05/14/2021>>>>>> 05/22/2021   Subjective: Says his cousin is coming from [place] ([place]) who is a CNA and can help him with wound care at home  Objective: Vitals:   05/16/21 2021 05/16/21 2143 05/17/21 0453 05/17/21 0700  BP:  111/72 118/72   Pulse:  100 (!) 103   Resp:  20 20   Temp:  98.7 F (37.1 C) 98.4 F (36.9 C)   TempSrc:  Oral    SpO2: 91% 99% 100% 100%  Weight:      Height:       No intake or output data in the 24 hours ending 05/17/21 1139 Filed Weights   05/08/21 1300 05/16/21 1527  Weight: 109.1 kg 108.1 kg    Examination:  General: Appearance:     Overweight male in no  acute distress   Multiple skin lesions  Lungs:     respirations unlabored  Heart:    Tachycardic. Normal rhythm. No murmurs, rubs, or gallops.   MS:   All extremities are intact.    Neurologic:   Awake, alert, poor insight       Data Reviewed: I have personally reviewed following labs and imaging studies  CBC: Recent Labs  Lab 05/12/21 1019 05/13/21 0520 05/14/21 0457 05/15/21 0801 05/15/21 2336 05/17/21 0417  WBC 14.5* 15.3* 16.3* 14.0* 15.3* 14.3*  NEUTROABS  10.8* 10.8* 11.6* 9.1*  --  9.9*  HGB 8.2* 8.3* 7.8* 6.7* 9.3* 8.9*  HCT 26.4* 26.9* 25.7* 22.1* 29.4* 28.2*  MCV 102.3* 103.9* 106.6* 104.7* 100.0 100.7*  PLT 126* 141* 151 158 185 643    Basic Metabolic Panel: Recent Labs  Lab 05/12/21 1019 05/13/21 0520 05/14/21 0457 05/15/21 0801 05/16/21 0916 05/17/21 0417  NA 139 139 137 136 140 141  K 2.9* 3.9 3.2* 3.6 3.4* 4.1  CL 107 111 111 107 108 109  CO2 24 24 23 24 27 26   GLUCOSE 110* 88 117* 82 81 87  BUN 12 11 10 10 9 9   CREATININE 1.51* 1.28* 1.29* 1.30* 1.32* 1.26*  CALCIUM 7.7* 7.9* 7.9* 7.7* 8.1* 8.6*  MG 1.3* 2.0  --  1.6* 2.1 1.8  PHOS 2.6  --   --   --   --   --     GFR: Estimated Creatinine Clearance: 106.6 mL/min (A) (by C-G formula based on SCr of 1.26 mg/dL (H)).  Liver Function Tests: Recent Labs  Lab 05/11/21 0429 05/12/21 1019 05/17/21 0417  AST 41 32 19  ALT 30 28 17   ALKPHOS 165* 192* 142*  BILITOT 1.1 0.8 0.4  PROT 6.2* 6.9 7.7  ALBUMIN 1.4* 1.5* 1.4*    CBG: No results for input(s): GLUCAP in the last 168 hours.   Recent Results (from the past 240 hour(s))  Resp Panel by RT-PCR (Flu A&B, Covid) Nasopharyngeal Swab     Status: None   Collection Time: 05/07/21  2:48 PM   Specimen: Nasopharyngeal Swab; Nasopharyngeal(NP) swabs in vial transport medium  Result Value Ref Range Status   SARS Coronavirus 2 by RT PCR NEGATIVE NEGATIVE Final    Comment: (NOTE) SARS-CoV-2 target nucleic acids are NOT DETECTED.  The SARS-CoV-2 RNA is generally detectable in upper respiratory specimens during the acute phase of infection. The lowest concentration of SARS-CoV-2 viral copies this assay can detect is 138 copies/mL. A negative result does not preclude SARS-Cov-2 infection and should not be used as the sole basis for treatment or other patient management decisions. A negative result may occur with  improper specimen collection/handling, submission of specimen other than nasopharyngeal swab, presence of  viral mutation(s) within the areas targeted by this assay, and inadequate number of viral copies(<138 copies/mL). A negative result must be combined with clinical observations, patient history, and epidemiological information. The expected result is Negative.  Fact Sheet for Patients:  EntrepreneurPulse.com.au  Fact Sheet for Healthcare Providers:  IncredibleEmployment.be  This test is no t yet approved or cleared by the [place] FDA and  has been authorized for detection and/or diagnosis of SARS-CoV-2 by FDA under an Emergency Use Authorization (EUA). This EUA will remain  in effect (meaning this test can be used) for the duration of the COVID-19 declaration under Section 564(b)(1) of the Act, 21 U.S.C.section 360bbb-3(b)(1), unless the authorization is terminated  or revoked sooner.  Influenza A by PCR NEGATIVE NEGATIVE Final   Influenza B by PCR NEGATIVE NEGATIVE Final    Comment: (NOTE) The Xpert Xpress SARS-CoV-2/FLU/RSV plus assay is intended as an aid in the diagnosis of influenza from Nasopharyngeal swab specimens and should not be used as a sole basis for treatment. Nasal washings and aspirates are unacceptable for Xpert Xpress SARS-CoV-2/FLU/RSV testing.  Fact Sheet for Patients: EntrepreneurPulse.com.au  Fact Sheet for Healthcare Providers: IncredibleEmployment.be  This test is not yet approved or cleared by the [place] FDA and has been authorized for detection and/or diagnosis of SARS-CoV-2 by FDA under an Emergency Use Authorization (EUA). This EUA will remain in effect (meaning this test can be used) for the duration of the COVID-19 declaration under Section 564(b)(1) of the Act, 21 U.S.C. section 360bbb-3(b)(1), unless the authorization is terminated or revoked.  Performed at [facility], [place] [address] [place], [place] [address]   Urine Culture      Status: Abnormal   Collection Time: 05/07/21  5:02 PM   Specimen: Urine, Clean Catch  Result Value Ref Range Status   Specimen Description   Final    URINE, CLEAN CATCH Performed at [facility], [place] [address], [place], [place] [address]    Special Requests   Final    NONE Performed at [facility], [place]  [address], [place], [place] [address]    Culture MULTIPLE SPECIES PRESENT, SUGGEST RECOLLECTION (A)  Final   Report Status 05/09/2021 FINAL  Final  Culture, blood (Routine X 2) w Reflex to ID Panel     Status: Abnormal   Collection Time: 05/07/21  5:03 PM   Specimen: BLOOD  Result Value Ref Range Status   Specimen Description   Final    BLOOD LEFT ANTECUBITAL Performed at [place] [address] [place], [place] [address]    Special Requests   Final    BOTTLES DRAWN AEROBIC AND ANAEROBIC Blood Culture adequate volume Performed at [place] [address] [place], [place] [address]    Culture  Setup Time   Final    GRAM POSITIVE COCCI IN CLUSTERS AEROBIC BOTTLE ONLY CRITICAL VALUE NOTED.  VALUE IS CONSISTENT WITH PREVIOUSLY REPORTED AND CALLED VALUE.    Culture (A)  Final    MICROCOCCUS SPECIES Standardized susceptibility testing for this organism is not available. Performed at [facility], [place] [address], [place], [place] [address]    Report Status 05/12/2021 FINAL  Final  Culture, blood (Routine X 2) w Reflex to ID Panel     Status: Abnormal   Collection Time: 05/07/21  5:08 PM   Specimen: BLOOD  Result Value Ref Range Status   Specimen Description   Final    BLOOD BLOOD LEFT FOREARM Performed at [place] [address], [place], [place] [address]    Special Requests   Final    BOTTLES DRAWN AEROBIC ONLY Blood Culture results may not be optimal due to an inadequate volume of blood received in culture bottles Performed at [facility] [address] [place], [place] [address]    Culture  Setup Time   Final    GRAM POSITIVE COCCI IN CLUSTERS AEROBIC BOTTLE ONLY CRITICAL RESULT CALLED TO, READ BACK BY AND VERIFIED WITH: PHARM D J.GADHIA ON [id number] AT 0957 BY E.PARRISH    Culture (A)  Final    MICROCOCCUS SPECIES Standardized susceptibility testing for this organism is not  available. Performed at [facility], [place] [address] [place], [place] [address]    Report Status 05/12/2021 FINAL  Final  Blood Culture ID Panel (Reflexed)     Status: None   Collection Time: 05/07/21  5:08 PM  Result Value Ref Range Status   Enterococcus faecalis NOT DETECTED NOT DETECTED Final   Enterococcus Faecium NOT DETECTED NOT DETECTED Final   Listeria monocytogenes NOT DETECTED NOT DETECTED Final   Staphylococcus species NOT DETECTED NOT DETECTED Final   Staphylococcus aureus (BCID) NOT DETECTED NOT DETECTED Final   Staphylococcus epidermidis NOT DETECTED NOT DETECTED Final   Staphylococcus lugdunensis NOT DETECTED NOT DETECTED Final   Streptococcus species NOT DETECTED NOT DETECTED Final   Streptococcus agalactiae NOT DETECTED NOT DETECTED Final   Streptococcus pneumoniae NOT DETECTED NOT DETECTED Final   Streptococcus pyogenes NOT DETECTED NOT DETECTED Final   A.calcoaceticus-baumannii NOT DETECTED NOT DETECTED Final   Bacteroides fragilis NOT DETECTED NOT DETECTED Final   Enterobacterales NOT DETECTED NOT DETECTED Final   Enterobacter cloacae complex NOT DETECTED NOT DETECTED Final   Escherichia coli NOT DETECTED NOT DETECTED Final   Klebsiella aerogenes NOT DETECTED NOT DETECTED Final   Klebsiella oxytoca NOT DETECTED NOT DETECTED Final   Klebsiella pneumoniae NOT DETECTED NOT DETECTED Final   Proteus species NOT DETECTED NOT DETECTED Final   Salmonella species NOT DETECTED NOT DETECTED Final   Serratia marcescens NOT DETECTED NOT DETECTED Final   Haemophilus influenzae NOT DETECTED NOT DETECTED Final   Neisseria  meningitidis NOT DETECTED NOT DETECTED Final   Pseudomonas aeruginosa NOT DETECTED NOT DETECTED Final   Stenotrophomonas maltophilia NOT DETECTED NOT DETECTED Final   Candida albicans NOT DETECTED NOT DETECTED Final   Candida auris NOT DETECTED NOT DETECTED Final   Candida glabrata NOT DETECTED NOT DETECTED Final   Candida krusei NOT DETECTED NOT DETECTED Final   Candida parapsilosis NOT DETECTED NOT DETECTED Final   Candida tropicalis NOT DETECTED NOT DETECTED Final   Cryptococcus neoformans/gattii NOT DETECTED NOT DETECTED Final    Comment: Performed at [facility], [address], [place], [place] [address]  Culture, blood (routine x 2)     Status: None (Preliminary result)   Collection Time: 05/12/21  9:30 AM   Specimen: BLOOD  Result Value Ref Range Status   Specimen Description   Final    BLOOD RIGHT ANTECUBITAL Performed at [place] [address], [place], [place] [address]    Special Requests   Final    BOTTLES DRAWN AEROBIC AND ANAEROBIC Blood Culture adequate volume Performed at [facility] [address], [place], [place] [address]    Culture   Final    NO GROWTH 4 DAYS Performed at [facility], [place] [address], [place], [place] [address]    Report Status PENDING  Incomplete  Culture, blood (routine x 2)     Status: None (Preliminary result)   Collection Time: 05/12/21  9:30 AM   Specimen: BLOOD  Result Value Ref Range Status   Specimen Description   Final    BLOOD RT HAND Performed at [facility] [address], [place], [place] [address]    Special Requests   Final    BOTTLES DRAWN AEROBIC ONLY Blood Culture adequate volume Performed at [facility] [address] [place], [place] [address]    Culture   Final    NO GROWTH 4 DAYS Performed at [facility],  [address] [place], [place] [address]    Report Status PENDING  Incomplete           Radiology Studies: No results found.      Scheduled Meds:  (feeding supplement) PROSource Plus  30 mL Oral BID BM   ALPRAZolam  2 mg Oral TID   amoxicillin-clavulanate  1 tablet Oral Q12H   bictegravir-emtricitabine-tenofovir AF  1 tablet Oral Daily   chlorhexidine  15 mL Mouth Rinse BID   escitalopram  20 mg Oral Daily   famotidine  40 mg Oral Daily   feeding supplement  1 Container Oral BID BM   feeding supplement  237 mL Oral Q24H   fluticasone  2 spray Each Nare Daily   folic acid  1 mg Oral Daily   Gerhardt's butt cream   Topical BID   guaiFENesin  1,200 mg Oral BID   ipratropium-albuterol  3 mL Nebulization BID   loratadine  10 mg Oral Daily   lurasidone  120 mg Oral q1800   magnesium oxide  400 mg Oral BID   mouth rinse  15 mL Mouth Rinse q12n4p   multivitamin with minerals  1 tablet Oral Daily   nicotine  21 mg Transdermal Daily   oxyCODONE-acetaminophen  1 tablet Oral Once   pantoprazole  40 mg Oral BID   thiamine  250 mg Oral Daily   valACYclovir  1,000 mg Oral Daily   Continuous Infusions:     LOS: 10 days    Time spent: 35 minutes    Geradine Girt, DO [facility]   To contact the attending provider between 7A-7P or the covering provider during after hours 7P-7A, please log into the web site [email or web address] and access using universal [place] password for that web site. If you do not have the password, please call the hospital operator.  05/17/2021, 11:39 AM

## 2021-05-17 NOTE — Care Management Important Message (Signed)
Important Message  Patient Details IM Letter given to the Patient. Name: [name] MRN: [id number] Date of Birth: 09-16-1986   Medicare Important Message Given:  Yes     Kerin Salen 05/17/2021, 3:37 PM

## 2021-05-17 NOTE — Progress Notes (Addendum)
Physical Therapy Treatment Patient Details Name: FREDDY KINNE MRN: [id number] DOB: September 16, 1986 Today's Date: 05/17/2021    History of Present Illness Patient is a 35 year old male admitted to ED after family called EMS, patient found in feces/urine with alcohol bottles all around him. Patient admitted with acute metabolic encephalopathy, Cellulitis bilateral lower extremity wounds, sacral/gluteal/thigh wound. PMH includes HIV, PTSD, schizoaffective disorder, seizures    PT Comments    Pt agreeable to working with therapy. Increased time to complete tasks. Intermittent assist provided by therapist. Discussed d/c plan-pt stated he prefers to go home. He does not wish to go to a rehab. He was able to demonstrate improved mobility and safety on today (compared to last session) but he still requires some assistance. Will continue to follow and progress activity as tolerated. Unsure if pt will be able manage care of his wounds at home.     Follow Up Recommendations  LTACH/SNF vs Home health PT;Supervision/Assist-24 hour;HHRN;Home Health Aide (pt prefers home/does not want placement)     Equipment Recommendations  Hospital bed;Wheelchair;3in1    Recommendations for Other Services       Precautions / Restrictions Precautions Precautions: Fall Precaution Comments: multiple wounds Restrictions Weight Bearing Restrictions: No    Mobility  Bed Mobility Overal bed mobility: Needs Assistance Bed Mobility: Supine to Sit     Supine to sit: Min guard;HOB elevated     General bed mobility comments: Increased time. Pt used gait belt to assist L LE off bed.    Transfers Overall transfer level: Needs assistance Equipment used: Rolling walker (2 wheeled) Transfers: Sit to/from Stand Sit to Stand: Min guard;From elevated surface         General transfer comment: Elevated bed height required. Cues for safety, hand placement. Increased time.  Ambulation/Gait Ambulation/Gait assistance:  Min assist Gait Distance (Feet): 125 Feet Assistive device: Rolling walker (2 wheeled) Gait Pattern/deviations: Step-through pattern;Wide base of support;Decreased stride length;Trunk flexed     General Gait Details: Cues for safety, RW proximity, BOS. Mostly Min guard with intermittent assist to steady/maneuver RW. Pt tolerated distance well.   Stairs             Wheelchair Mobility    Modified Rankin (Stroke Patients Only)       Balance Overall balance assessment: Needs assistance;History of Falls         Standing balance support: Bilateral upper extremity supported Standing balance-Leahy Scale: Poor                              Cognition Arousal/Alertness: Awake/alert Behavior During Therapy: WFL for tasks assessed/performed Overall Cognitive Status: Within Functional Limits for tasks assessed                                 General Comments: still some poor insight but he is clear he does not wish to go to rehab      Exercises      General Comments        Pertinent Vitals/Pain Pain Assessment: 0-10 Pain Score: 8  Pain Location: legs and buttock with bed mobility Pain Descriptors / Indicators: Discomfort;Sore Pain Intervention(s): Monitored during session;Repositioned    Home Living                      Prior Function  PT Goals (current goals can now be found in the care plan section) Progress towards PT goals: Progressing toward goals    Frequency    Min 3X/week      PT Plan Discharge plan needs to be updated    Co-evaluation              AM-PAC PT "6 Clicks" Mobility   Outcome Measure  Help needed turning from your back to your side while in a flat bed without using bedrails?: A Little Help needed moving from lying on your back to sitting on the side of a flat bed without using bedrails?: A Little Help needed moving to and from a bed to a chair (including a wheelchair)?: A  Little Help needed standing up from a chair using your arms (e.g., wheelchair or bedside chair)?: A Little Help needed to walk in hospital room?: A Little Help needed climbing 3-5 steps with a railing? : A Lot 6 Click Score: 17    End of Session Equipment Utilized During Treatment: Gait belt Activity Tolerance: Patient tolerated treatment well Patient left: in chair;with call bell/phone within reach;with chair alarm set   PT Visit Diagnosis: Other abnormalities of gait and mobility (R26.89);Muscle weakness (generalized) (M62.81);Unsteadiness on feet (R26.81);Pain Pain - Right/Left: Left Pain - part of body: Hip     Time: 1610-9604 PT Time Calculation (min) (ACUTE ONLY): 25 min  Charges:  $Gait Training: 23-37 mins                         Doreatha Massed, PT Acute Rehabilitation  Office: [phone] Pager: [phone]

## 2021-05-17 NOTE — Progress Notes (Signed)
[facility] Progress Note  [name] 35 y.o. [birth year]  CC: Anemia, history of gastric ulcer   Subjective: Patient seen and examined at bedside.  No acute GI issues noted.  Continues to deny blood in the stool or black stool.  ROS : Afebrile, negative for chest pain   Objective: Vital signs in last 24 hours: Vitals:   05/17/21 0453 05/17/21 0700  BP: 118/72   Pulse: (!) 103   Resp: 20   Temp: 98.4 F (36.9 C)   SpO2: 100% 100%    Physical Exam:  General : Resting in the bed.  Not in acute distress Abdomen : Soft, nontender, nondistended, bowel sounds present.  No peritoneal sign  Lab Results: Recent Labs    05/16/21 0916 05/17/21 0417  NA 140 141  K 3.4* 4.1  CL 108 109  CO2 27 26  GLUCOSE 81 87  BUN 9 9  CREATININE 1.32* 1.26*  CALCIUM 8.1* 8.6*  MG 2.1 1.8   Recent Labs    05/17/21 0417  AST 19  ALT 17  ALKPHOS 142*  BILITOT 0.4  PROT 7.7  ALBUMIN 1.4*   Recent Labs    05/15/21 0801 05/15/21 2336 05/17/21 0417  WBC 14.0* 15.3* 14.3*  NEUTROABS 9.1*  --  9.9*  HGB 6.7* 9.3* 8.9*  HCT 22.1* 29.4* 28.2*  MCV 104.7* 100.0 100.7*  PLT 158 185 210   No results for input(s): LABPROT, INR in the last 72 hours.    Assessment/Plan: -Acute on chronic anemia.  No overt bleeding.  Slow blood loss could be from bleeding from posterior thigh wounds/cellulitis -History of gastric ulcers. -Abnormal CT scan concerning for thickening versus fatty infiltration of the cecum. Colonoscopy 02/26/2021 showed fair prep in the cecum and sigmoid colon, normal terminal ileum and no evidence of active bleeding.   Recommendations ------------------------- -Tentative plan for EGD tomorrow for follow-up on gastric ulcer -Continue current supportive care -Keep n.p.o. past midnight -Repeat H&H in the morning -GI will follow  Risks (bleeding, infection, bowel perforation that could require surgery, sedation-related changes in cardiopulmonary  systems), benefits (identification and possible treatment of source of symptoms, exclusion of certain causes of symptoms), and alternatives (watchful waiting, radiographic imaging studies, empiric medical treatment)  were explained to patient/family in detail and patient wishes to proceed.      Otis Brace MD, FACP 05/17/2021, 10:02 AM  Contact #  [phone]

## 2021-05-18 ENCOUNTER — Inpatient Hospital Stay (HOSPITAL_COMMUNITY): Payer: Medicare HMO | Admitting: Anesthesiology

## 2021-05-18 ENCOUNTER — Encounter (HOSPITAL_COMMUNITY)
Admission: EM | Disposition: A | Discharge status: Home Health | Payer: Self-pay | Source: Home / Self Care | Attending: Internal Medicine

## 2021-05-18 ENCOUNTER — Ambulatory Visit: Admit: 2021-05-18 | Payer: Medicare HMO | Admitting: Orthopedic Surgery

## 2021-05-18 HISTORY — PX: ESOPHAGOGASTRODUODENOSCOPY (EGD) WITH PROPOFOL: SHX5813

## 2021-05-18 LAB — CBC
HCT: 30 % — ABNORMAL LOW (ref 39.0–52.0)
Hemoglobin: 9.2 g/dL — ABNORMAL LOW (ref 13.0–17.0)
MCH: 31.8 pg (ref 26.0–34.0)
MCHC: 30.7 g/dL (ref 30.0–36.0)
MCV: 103.8 fL — ABNORMAL HIGH (ref 80.0–100.0)
Platelets: 213 10*3/uL (ref 150–400)
RBC: 2.89 MIL/uL — ABNORMAL LOW (ref 4.22–5.81)
RDW: 19.1 % — ABNORMAL HIGH (ref 11.5–15.5)
WBC: 11.1 10*3/uL — ABNORMAL HIGH (ref 4.0–10.5)
nRBC: 0 % (ref 0.0–0.2)

## 2021-05-18 LAB — CULTURE, BLOOD (ROUTINE X 2)
Culture: NO GROWTH
Culture: NO GROWTH
Special Requests: ADEQUATE
Special Requests: ADEQUATE

## 2021-05-18 SURGERY — ESOPHAGOGASTRODUODENOSCOPY (EGD) WITH PROPOFOL
Anesthesia: Monitor Anesthesia Care

## 2021-05-18 SURGERY — ARTHROPLASTY, HIP, TOTAL, ANTERIOR APPROACH
Anesthesia: Spinal | Site: Hip | Laterality: Left

## 2021-05-18 MED ORDER — PROPOFOL 10 MG/ML IV BOLUS
INTRAVENOUS | Status: DC | PRN
  Administered 2021-05-18: 20 mg via INTRAVENOUS

## 2021-05-18 MED ORDER — PROPOFOL 500 MG/50ML IV EMUL
INTRAVENOUS | Status: AC
  Filled 2021-05-18: qty 50

## 2021-05-18 MED ORDER — LACTATED RINGERS IV SOLN
INTRAVENOUS | Status: DC
  Administered 2021-05-18: 1000 mL via INTRAVENOUS

## 2021-05-18 MED ORDER — PROPOFOL 500 MG/50ML IV EMUL
INTRAVENOUS | Status: DC | PRN
  Administered 2021-05-18: 150 ug/kg/min via INTRAVENOUS

## 2021-05-18 MED ORDER — PANTOPRAZOLE SODIUM 40 MG PO TBEC
40.0000 mg | DELAYED_RELEASE_TABLET | Freq: Every day | ORAL | Status: DC
  Administered 2021-05-18 – 2021-05-19 (×2): 40 mg via ORAL
  Filled 2021-05-18 (×2): qty 1

## 2021-05-18 SURGICAL SUPPLY — 15 items

## 2021-05-18 NOTE — Anesthesia Postprocedure Evaluation (Signed)
Anesthesia Post Note  Patient: [name]  Procedure(s) Performed: ESOPHAGOGASTRODUODENOSCOPY (EGD) WITH PROPOFOL     Patient location during evaluation: PACU Anesthesia Type: MAC Level of consciousness: awake and alert Pain management: pain level controlled Vital Signs Assessment: post-procedure vital signs reviewed and stable Respiratory status: spontaneous breathing, nonlabored ventilation and respiratory function stable Cardiovascular status: stable and blood pressure returned to baseline Anesthetic complications: no   No notable events documented.  Last Vitals:  Vitals:   05/18/21 0920 05/18/21 0930  BP: 103/67 109/75  Pulse: 98 85  Resp: (!) 23 18  Temp:    SpO2: 92% 99%    Last Pain:  Vitals:   05/18/21 0930  TempSrc:   PainSc: 0-No pain                 Audry Pili

## 2021-05-18 NOTE — Progress Notes (Signed)
Pt refused use of flutter / cpt at this time.

## 2021-05-18 NOTE — Plan of Care (Signed)

## 2021-05-18 NOTE — TOC Progression Note (Signed)
Transition of Care [facility]) - Progression Note    Patient Details  Name: JUBAL RADEMAKER MRN: [id number] Date of Birth: 1985/11/30  Transition of Care [facility]) CM/SW Contact  Evadne Ose, Juliann Pulse, RN Phone Number: 05/18/2021, 12:57 PM  Clinical Narrative: TC per patient permission Veronica(cousin) [phone] [place] through [facility] will continue to provide those services-She will be the primary caregiver for [facility] agency to instruct on wound care,therapy-[facility] rep Levada Dy will assess if able to accept. Nsg to make arrangements with family primary care giver for instruction in hospital on wound care. Recc for HHRN/PT/aide. Continue to monitor for d/c needs.    Expected Discharge Plan: [place] Barriers to Discharge: Continued Medical Work up  Expected Discharge Plan and Services Expected Discharge Plan: [place]   Discharge Planning Services: CM Consult Post Acute Care Choice: [place] arrangements for the past 2 months: Apartment                           HH Arranged: RN, PT, Nurse's Aide HH Agency: [place] [facility] Date [place]: 05/18/21 Time [place]: 1255 Representative spoke with at [place]: [place] ([place]) Interventions    Readmission Risk Interventions Readmission Risk Prevention Plan 11/01/2019  Home Care Screening Complete  Medication Review (RN CM) Complete  Some recent data might be hidden

## 2021-05-18 NOTE — Plan of Care (Signed)

## 2021-05-18 NOTE — Progress Notes (Signed)
PROGRESS NOTE    [name]  MVE:[id number] DOB: Dec 25, 1985 DOA: 05/07/2021 PCP: [name], MD   Chief Complaint  Patient presents with   Leg Swelling    Brief Narrative: Patient is a 35 year old male with history of HIV, PTSD, schizoaffective disorder, seizures, heavy alcohol abuse, hypertension was brought to ED via EMS for altered mental status.  Patient's family called EMS because he was not taking his medications.  He was laying on the couch for the last 2 weeks, covered in feces and urine.  Patient was surrounded by many alcohol bottles.  He was also noted to have increased lower extremity swelling, sores and redness to the feet. Received high-dose IV thiamine for 3 days, completed. Patient has extensive cellulitis and wounds of the bilateral buttocks, posterior thighs with purulent and bloody drainage.  Placed on IV vancomycin and Rocephin. General surgery was consulted, CT scans showed drainable abscess.   Blood cultures positive for GPC in clusters, continue current antibiotics.  ID consulted   Plan to pursue LTAC for wound care,  antibiotics.   Assessment & Plan:   Principal Problem:   Acute metabolic encephalopathy Active Problems:   Schizoaffective disorder, depressive type (HCC)   Severe alcohol dependence (HCC)   GERD (gastroesophageal reflux disease)   Elevated LFTs   HIV disease (HCC)   Anemia   Cellulitis   Left hip pain   Transaminitis    acute metabolic encephalopathy -Questionable etiology. -Could possibly be secondary to Wernicke's encephalopathy/Korsakoff syndrome versus hepatic encephalopathy versus secondary to infectious etiology/cellulitis.  Patient noted to have underlying psychiatric history. -CT head done negative for any acute abnormalities except increase fluid in the right ethmoid air cells. -Ammonia level at 30, TSH 1.1, B1 pending.,  Vitamin B12 at 2821. -Patient noted to have completed high-dose thiamine 500 mg 3 times daily x3  days, currently on thiamine 250 mg daily. -Mental status improved and likely close to baseline.   -Patient was being followed by psychiatry who have signed off.  Cellulitis bilateral lower extremity wounds, sacral/gluteal/thigh wound, POA -Patient noted to have some lower extremity edema felt likely secondary to severe hypoalbuminemia and third spacing. -2D echo with normal EF of 60 to 28%, grade 1 diastolic dysfunction. -Lower extremity Dopplers negative for DVT. -IV antibiotics of vancomycin and Rocephin were narrowed to IV Unasyn and subsequently transitioned to oral Augmentin per ID to complete course of treatment.  -General surgery was following however signed off but they state no drainable abscess noted.  Micrococcus bacteremia  -Repeat blood cultures pending with no growth to date x4 days. -Was on IV vancomycin and transitioned to IV Unasyn and subsequently transitioned to oral Augmentin per ID to complete course of antibiotic treatment: Plan for 2 weeks of therapy 7/29-8/11to complete course of therapy for skin and soft tissue infection and possible uncomplicated bacteremia  Acute on chronic anemia/microcytic anemia/thrombocytopenia -Likely alcohol induced. -FOBT negative 05/07/2021, 05/15/2021 -Anemia panel consistent with anemia of chronic disease. -Patient noted to have some bleeding from wound on posterior thighs, gluteal and sacral area. -Status post transfusion 3 units packed red blood cells . -Hemoglobin was 6.7 on 05/15/2021 status post transfusion of 2 units packed red blood cells with hemoglobin currently at 9.3 this morning.   -Patient noted to have had EGD done 02/2021 with gastric ulcers noted and patient placed on PPI twice daily with recommendations for repeat EGD in 3 months to follow-up on healing. -Patient seen in consultation by GI: s/p EGD  transaminitis with elevated  alk phosphatase, AST, thrombocytopenia -Likely alcohol induced. -Abdominal ultrasound negative for  cholelithiasis or biliary duct dilatation however consistent with hepatic steatosis. -LFTs improving. -Outpatient follow-up.  Acute kidney injury -Likely secondary to prerenal azotemia in the setting of poor oral intake and intravascular depletion. -Improved with hydration.   -Follow.   Hypokalemia/hypomagnesemia -Potassium at 3.4.  Magnesium at 2.1.   -Replete potassium.   -Repeat labs in the morning.    History of alcohol abuse -No signs of withdrawal noted. -Status post high-dose thiamine.   -Continue thiamine, folic acid.   -Ativan withdrawal protocol has been discontinued.   HIV disease -Patient noted to be noncompliant with antiretrovirals. -Continue valacyclovir, Biktarvy.   -Outpatient follow-up with ID  Schizoaffective disorder, depressive disorder -Stable.   -Continue current regimen of Lexapro, Latuda, Xanax.   -Psychiatry was following but have signed off.   -Outpatient follow-up with psychiatry.    GERD -PPI twice daily.   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 -Plain films of the left hip showed chronic and absent fracture of left femoral head similar to prior exam. -Patient noted to have been scheduled for surgery on 05/18/2021 with orthopedics, Dr. Alvan Dame for total hip arthroplasty. -Patient has been seen in consultation by orthopedics who feel patient is at significant high risk for surgery based on medical comorbidities and now recommending rescheduling surgery once issues have been addressed. -Outpatient follow-up with orthopedics.    DVT prophylaxis: SCDs Code Status: Full Family Communication: No family at bedside. Disposition:   Status is: Inpatient  Remains inpatient appropriate because:IV treatments appropriate due to intensity of illness or inability to take PO                Patient currently is not medically  stable to d/c.   Difficult to place patient No Home in AM if patient has proper help      Consultants:  Psychiatry: Dr. Dwyane Dee 05/10/2021 Wound care RN 05/10/2021 General surgery: Dr. Redmond Pulling 05/11/2021 Infectious disease: Dr. Juleen China 05/11/2021  Orthopedics: Costella Hatcher, PA 05/12/2021 GI: Dr. Alessandra Bevels 05/16/2021  Procedures:  CT pelvis 05/10/2021 CT head 05/07/2021 Plain films of the pelvis and left hip 05/08/2021 Chest x-ray 05/07/2021 Abdominal ultrasound 05/07/2021 2D echo 05/08/2021 Lower extremity Doppler 05/08/2021 Transfusing 2 units packed red blood cells 05/15/2021 Transfusion of 3 units PRBCs 7/31, 8/ 1, 8/ 2.  Antimicrobials:  IV Rocephin 05/07/2021>>>>> 05/14/2021 IV vancomycin 05/08/2021>>>>>>> 05/13/2021 IV Unasyn 05/13/2021>>>>> 05/14/2021 Augmentin 05/14/2021>>>>>> 05/22/2021   Subjective: Again says his cousin is coming down to[place] to help him  Objective: Vitals:   05/18/21 0842 05/18/21 0913 05/18/21 0920 05/18/21 0930  BP: 116/75 (!) 89/56 103/67 109/75  Pulse: 97 91 98 85  Resp: (!) 22 20 (!) 23 18  Temp: 98.4 F (36.9 C) 98 F (36.7 C)    TempSrc: Oral Oral    SpO2: 96% 100% 92% 99%  Weight:      Height:        Intake/Output Summary (Last 24 hours) at 05/18/2021 1307 Last data filed at 05/17/2021 2239 Gross per 24 hour  Intake 480 ml  Output 325 ml  Net 155 ml   Filed Weights   05/08/21 1300 05/16/21 1527  Weight: 109.1 kg 108.1 kg    Examination:   General: Appearance:     Overweight male in no acute distress     Lungs:  respirations unlabored  Heart:    Normal heart rate. Normal rhythm. No murmurs, rubs, or gallops.    MS:   All extremities are intact.    Neurologic:   Awake, alert         Data Reviewed: I have personally reviewed following labs and imaging studies  CBC: Recent Labs  Lab 05/12/21 1019 05/13/21 0520 05/14/21 0457 05/15/21 0801 05/15/21 2336 05/17/21 0417 05/18/21 0419  WBC 14.5* 15.3* 16.3* 14.0* 15.3* 14.3* 11.1*   NEUTROABS 10.8* 10.8* 11.6* 9.1*  --  9.9*  --   HGB 8.2* 8.3* 7.8* 6.7* 9.3* 8.9* 9.2*  HCT 26.4* 26.9* 25.7* 22.1* 29.4* 28.2* 30.0*  MCV 102.3* 103.9* 106.6* 104.7* 100.0 100.7* 103.8*  PLT 126* 141* 151 158 185 210 563    Basic Metabolic Panel: Recent Labs  Lab 05/12/21 1019 05/13/21 0520 05/14/21 0457 05/15/21 0801 05/16/21 0916 05/17/21 0417  NA 139 139 137 136 140 141  K 2.9* 3.9 3.2* 3.6 3.4* 4.1  CL 107 111 111 107 108 109  CO2 24 24 23 24 27 26   GLUCOSE 110* 88 117* 82 81 87  BUN 12 11 10 10 9 9   CREATININE 1.51* 1.28* 1.29* 1.30* 1.32* 1.26*  CALCIUM 7.7* 7.9* 7.9* 7.7* 8.1* 8.6*  MG 1.3* 2.0  --  1.6* 2.1 1.8  PHOS 2.6  --   --   --   --   --     GFR: Estimated Creatinine Clearance: 106.6 mL/min (A) (by C-G formula based on SCr of 1.26 mg/dL (H)).  Liver Function Tests: Recent Labs  Lab 05/12/21 1019 05/17/21 0417  AST 32 19  ALT 28 17  ALKPHOS 192* 142*  BILITOT 0.8 0.4  PROT 6.9 7.7  ALBUMIN 1.5* 1.4*    CBG: No results for input(s): GLUCAP in the last 168 hours.   Recent Results (from the past 240 hour(s))  Culture, blood (routine x 2)     Status: None   Collection Time: 05/12/21  9:30 AM   Specimen: BLOOD  Result Value Ref Range Status   Specimen Description   Final    BLOOD RIGHT ANTECUBITAL Performed at [facility] [address] [place], [place] [address]    Special Requests   Final    BOTTLES DRAWN AEROBIC AND ANAEROBIC Blood Culture adequate volume Performed at [facility] [address], [place], [place] [address]    Culture   Final    NO GROWTH 6 DAYS Performed at [facility], [place] [address], [place], [place] [address]    Report Status 05/18/2021 FINAL  Final  Culture, blood (routine x 2)     Status: None   Collection Time: 05/12/21  9:30 AM   Specimen: BLOOD  Result Value Ref Range Status   Specimen Description   Final    BLOOD RT HAND Performed at [facility] [address], [place], [place] [address]    Special Requests   Final    BOTTLES DRAWN AEROBIC ONLY Blood Culture adequate volume Performed at [facility] [address], [place], [place] [address]    Culture   Final    NO GROWTH 6 DAYS Performed at [facility], [place] [address], [place], [place] [address]    Report Status 05/18/2021 FINAL  Final          Radiology Studies: No results found.      Scheduled Meds:  (feeding supplement)  PROSource Plus  30 mL Oral BID BM   ALPRAZolam  2 mg Oral TID   amoxicillin-clavulanate  1 tablet Oral Q12H   bictegravir-emtricitabine-tenofovir AF  1 tablet Oral Daily   chlorhexidine  15 mL Mouth Rinse BID   escitalopram  20 mg Oral Daily   feeding supplement  1 Container Oral BID BM   feeding supplement  237 mL Oral Q24H   fluticasone  2 spray Each Nare Daily   folic acid  1 mg Oral Daily   Gerhardt's butt cream   Topical BID   guaiFENesin  1,200 mg Oral BID   ipratropium-albuterol  3 mL Nebulization BID   loratadine  10 mg Oral Daily   lurasidone  120 mg Oral q1800   magnesium oxide  400 mg Oral BID   mouth rinse  15 mL Mouth Rinse q12n4p   multivitamin with minerals  1 tablet Oral Daily   nicotine  21 mg Transdermal Daily   pantoprazole  40 mg Oral Daily   thiamine  250 mg Oral Daily   valACYclovir  1,000 mg Oral Daily   Continuous Infusions:     LOS: 11 days    Time spent: 25 minutes    Geradine Girt, DO [facility]   To contact the attending provider between 7A-7P or the covering provider during after hours 7P-7A, please log into the web site [email or web address] and access using universal [place] password for that web site. If you do not have the password, please call the hospital operator.  05/18/2021, 1:07 PM

## 2021-05-18 NOTE — Progress Notes (Signed)
Patient wanted to sleep and not be bothered with chest vest at this time. We will resume at next treatment time.

## 2021-05-18 NOTE — Progress Notes (Signed)
Pt refused dressing change again at this time even with RN advice need to change it. Will  ask again later.

## 2021-05-18 NOTE — Progress Notes (Signed)
[facility] Progress Note  [name] 35 y.o. 1986/03/01  CC: Anemia, history of gastric ulcer   Subjective: Patient seen and examined at bedside in the Endo unit.  No acute GI changes overnight.  Denies rectal bleeding.  Hemoglobin stable  ROS : Afebrile, negative for chest pain   Objective: Vital signs in last 24 hours: Vitals:   05/17/21 1926 05/17/21 2025  BP: 110/73   Pulse: 92   Resp: 20   Temp: 98.3 F (36.8 C)   SpO2: 100% 98%    Physical Exam:  General : Resting in the bed.  Not in acute distress Abdomen : Soft, nontender, nondistended, bowel sounds present.  No peritoneal sign  Lab Results: Recent Labs    05/16/21 0916 05/17/21 0417  NA 140 141  K 3.4* 4.1  CL 108 109  CO2 27 26  GLUCOSE 81 87  BUN 9 9  CREATININE 1.32* 1.26*  CALCIUM 8.1* 8.6*  MG 2.1 1.8   Recent Labs    05/17/21 0417  AST 19  ALT 17  ALKPHOS 142*  BILITOT 0.4  PROT 7.7  ALBUMIN 1.4*   Recent Labs    05/17/21 0417 05/18/21 0419  WBC 14.3* 11.1*  NEUTROABS 9.9*  --   HGB 8.9* 9.2*  HCT 28.2* 30.0*  MCV 100.7* 103.8*  PLT 210 213   No results for input(s): LABPROT, INR in the last 72 hours.    Assessment/Plan: -Acute on chronic anemia.  No overt bleeding.  Slow blood loss could be from bleeding from posterior thigh wounds/cellulitis -History of gastric ulcers. -Abnormal CT scan concerning for thickening versus fatty infiltration of the cecum. Colonoscopy 02/26/2021 showed fair prep in the cecum and sigmoid colon, normal terminal ileum and no evidence of active bleeding.   Recommendations ------------------------- -Hemoglobin stable. -Proceed with EGD today  Risks (bleeding, infection, bowel perforation that could require surgery, sedation-related changes in cardiopulmonary systems), benefits (identification and possible treatment of source of symptoms, exclusion of certain causes of symptoms), and alternatives (watchful waiting, radiographic  imaging studies, empiric medical treatment)  were explained to patient/family in detail and patient wishes to proceed.      Otis Brace MD, FACP 05/18/2021, 8:41 AM  Contact #  [phone]

## 2021-05-18 NOTE — Progress Notes (Signed)
Occupational Therapy Treatment Patient Details Name: [name] MRN: [id number] DOB: [birth year] Today's Date: 05/18/2021    History of present illness Patient is a 35 year old male admitted to ED after family called EMS, patient found in feces/urine with alcohol bottles all around him. Patient admitted with acute metabolic encephalopathy, Cellulitis bilateral lower extremity wounds, sacral/gluteal/thigh wound. PMH includes HIV, PTSD, schizoaffective disorder, seizures   OT comments  Patient was noted to be making improvements towards goals. Patient continues to require education and cues to apply education with patient noted to have poor safety awareness. Patient attempted standing to void urine with mod A to maintain standing balance and education to keep one UE support during ADLs tasks. Patient's discharge plan remains appropriate at this time. OT will continue to follow acutely.    Follow Up Recommendations  SNF    Equipment Recommendations  Tub/shower bench    Recommendations for Other Services      Precautions / Restrictions Precautions Precautions: Fall Precaution Comments: multiple wounds Restrictions Weight Bearing Restrictions: No       Mobility Bed Mobility Overal bed mobility: Needs Assistance Bed Mobility: Supine to Sit Rolling: Mod assist Sidelying to sit: Min assist;HOB elevated Supine to sit: Min guard;HOB elevated     General bed mobility comments: Increased time. Pt used gait belt to assist L LE off bed.    Transfers Overall transfer level: Needs assistance Equipment used: Rolling walker (2 wheeled) Transfers: Sit to/from Stand Sit to Stand: Min guard;From elevated surface Stand pivot transfers: Mod assist       General transfer comment: Elevated bed height required. Cues for safety, hand placement. Increased time.    Balance           Standing balance support: Bilateral upper extremity supported;During functional activity Standing  balance-Leahy Scale: Poor                             ADL either performed or assessed with clinical judgement   ADL Overall ADL's : Needs assistance/impaired                         Toilet Transfer: RW;Cueing for safety;Cueing for sequencing;Minimal assistance     Toileting - Clothing Manipulation Details (indicate cue type and reason): patient needed mod A for standing balacne with patietn declining to sit to void urine. patient attempted to reach backwards to wash hands in sink with extensive education on how it was important to stand infront of the sink to wash hands v.s. remaining infront of the toilet. patient verbalized understanding but did not apply education to tasks requiring increased assistance and cues to get back to bed in room with rolling walker safely.     Functional mobility during ADLs: Moderate assistance;Rolling walker;Cueing for safety;Cueing for sequencing General ADL Comments: patient was noted to push walker out too far with consistent multimodal cues to keep rolling walker close when completing functional mobiltiy and standing. patient when about 5 steps away from bed attempted to push walker away reporting " i can walk" patient was educated on how standing balance was mod A with no BUE support and walking without walker was not safe. patient after continued education agreed to keep walker with him to complete transition to bed.     Vision Patient Visual Report: No change from baseline     Perception     Praxis      Cognition  Exercises     Shoulder Instructions       General Comments      Pertinent Vitals/ Pain       Pain Score: 9  Pain Location: legs and buttock with bed mobility Pain Descriptors / Indicators: Discomfort;Sore Pain Intervention(s): Limited activity within patient's tolerance;Monitored during session  Home Living                                           Prior Functioning/Environment              Frequency  Min 2X/week        Progress Toward Goals  OT Goals(current goals can now be found in the care plan section)  Progress towards OT goals: Progressing toward goals  Acute Rehab OT Goals Patient Stated Goal: to walk OT Goal Formulation: With patient Time For Goal Achievement: 05/28/21 Potential to Achieve Goals: Good  Plan Discharge plan remains appropriate    Co-evaluation                 AM-PAC OT "6 Clicks" Daily Activity     Outcome Measure   Help from another person eating meals?: A Little Help from another person taking care of personal grooming?: A Little Help from another person toileting, which includes using toliet, bedpan, or urinal?: A Lot Help from another person bathing (including washing, rinsing, drying)?: A Lot Help from another person to put on and taking off regular upper body clothing?: A Little Help from another person to put on and taking off regular lower body clothing?: A Lot 6 Click Score: 15    End of Session Equipment Utilized During Treatment: Gait belt;Rolling walker  OT Visit Diagnosis: Pain;Unsteadiness on feet (R26.81);Other abnormalities of gait and mobility (R26.89);Muscle weakness (generalized) (M62.81) Pain - Right/Left: Left Pain - part of body: Leg   Activity Tolerance Patient tolerated treatment well   Patient Left in bed;with call bell/phone within reach;with bed alarm set   Nurse Communication Mobility status        Time: 0569-7948 OT Time Calculation (min): 23 min  Charges: OT General Charges $OT Visit: 1 Visit OT Treatments $Self Care/Home Management : 23-37 mins  Jackelyn Poling OTR/L, MS Acute Rehabilitation Department Office# [phone] Pager# [phone]    [place] 05/18/2021, 12:57 PM

## 2021-05-18 NOTE — Progress Notes (Signed)
Patient refused dressing change at this time. Will ask again later.

## 2021-05-18 NOTE — Brief Op Note (Signed)
05/07/2021 - 05/18/2021  9:09 AM  PATIENT:  [name]  35 y.o. male  PRE-OPERATIVE DIAGNOSIS:  History of gastric ulcer  POST-OPERATIVE DIAGNOSIS:  ulcer healed, gastritis  PROCEDURE:  Procedure(s): ESOPHAGOGASTRODUODENOSCOPY (EGD) WITH PROPOFOL ([name])  SURGEON:  Surgeon(s) and Role:    * Bodhi Moradi, MD - Primary  Findings ---------- -EGD showed gastritis, no evidence of ulcers or bleeding.  Recommendations ------------------------ -Change Protonix to p.o. 40 mg once a day -Start soft diet and advance as tolerated -Avoid NSAIDs and alcohol -No further inpatient GI work-up planned. -Okay to discharge from GI standpoint.  GI will sign off.  Call us back if needed.   Otis Brace MD, FACP 05/18/2021, 9:10 AM  Contact #  [phone]

## 2021-05-18 NOTE — Transfer of Care (Signed)
Immediate Anesthesia Transfer of Care Note  Patient: [name]  Procedure(s) Performed: ESOPHAGOGASTRODUODENOSCOPY (EGD) WITH PROPOFOL  Patient Location: PACU  Anesthesia Type:MAC  Level of Consciousness: sedated  Airway & Oxygen Therapy: Patient Spontanous Breathing and Patient connected to face mask oxygen  Post-op Assessment: Report given to RN and Post -op Vital signs reviewed and stable  Post vital signs: Reviewed and stable  Last Vitals:  Vitals Value Taken Time  BP 86/53 05/18/21 0912  Temp    Pulse 89 05/18/21 0913  Resp 19 05/18/21 0913  SpO2 100 % 05/18/21 0913  Vitals shown include unvalidated device data.  Last Pain:  Vitals:   05/18/21 0842  TempSrc: Oral  PainSc: 0-No pain      Patients Stated Pain Goal: 3 (64/35/39 1225)  Complications: No notable events documented.

## 2021-05-18 NOTE — Op Note (Signed)
[facility] Patient Name: [name] Procedure Date: 05/18/2021 MRN: [id number] Attending MD: Otis Brace , MD Date of Birth: November 06, 1985 CSN: [id number] Age: 35 Admit Type: Inpatient Procedure:                Upper GI endoscopy Indications:              Follow-up of gastric ulcer Providers:                Otis Brace, MD, Burtis Junes, RN, Cletis [place],                            Technician, [place] CRNA, CRNA Referring MD:              Medicines:                Sedation Administered by an Anesthesia Professional Complications:            No immediate complications. Estimated Blood Loss:     Estimated blood loss was minimal. Procedure:                Pre-Anesthesia Assessment:                           - Prior to the procedure, a History and Physical                            was performed, and patient medications and                            allergies were reviewed. The patient's tolerance of                            previous anesthesia was also reviewed. The risks                            and benefits of the procedure and the sedation                            options and risks were discussed with the patient.                            All questions were answered, and informed consent                            was obtained. Prior Anticoagulants: The patient has                            taken no previous anticoagulant or antiplatelet                            agents. ASA Grade Assessment: III - A patient with                            severe systemic disease. After reviewing the risks  and benefits, the patient was deemed in                            satisfactory condition to undergo the procedure.                           After obtaining informed consent, the endoscope was                            passed under direct vision. Throughout the                            procedure, the patient's blood pressure,  pulse, and                            oxygen saturations were monitored continuously. The                            GIF-H190 ([id number]) Olympus endoscope was introduced                            through the mouth, and advanced to the second part                            of duodenum. The upper GI endoscopy was                            accomplished without difficulty. The patient                            tolerated the procedure well. Scope In: Scope Out: Findings:      A non-obstructing Schatzki ring was found at the gastroesophageal       junction.      A small hiatal hernia was present.      Scattered mild inflammation characterized by congestion (edema) and       erythema was found in the entire examined stomach.      The cardia and gastric fundus were normal on retroflexion.      The duodenal bulb, first portion of the duodenum and second portion of       the duodenum were normal. Impression:               - Non-obstructing Schatzki ring.                           - Small hiatal hernia.                           - Gastritis.                           - Normal duodenal bulb, first portion of the                            duodenum and second portion of the duodenum.                           -  No specimens collected. Moderate Sedation:      Moderate (conscious) sedation was personally administered by an       anesthesia professional. The following parameters were monitored: oxygen       saturation, heart rate, blood pressure, and response to care. Recommendation:           - Return patient to hospital ward for ongoing care.                           - Soft diet.                           - Continue present medications.                           - Return to my office in 6 months. Procedure Code(s):        --- Professional ---                           [id number], Esophagogastroduodenoscopy, flexible,                            transoral; diagnostic, including collection of                             specimen(s) by brushing or washing, when performed                            (separate procedure) Diagnosis Code(s):        --- Professional ---                           K22.2, Esophageal obstruction                           K44.9, Diaphragmatic hernia without obstruction or                            gangrene                           K29.70, Gastritis, unspecified, without bleeding                           K25.9, Gastric ulcer, unspecified as acute or                            chronic, without hemorrhage or perforation CPT copyright 2019 [employer]. All rights reserved. The codes documented in this report are preliminary and upon coder review may  be revised to meet current compliance requirements. Otis Brace, MD Otis Brace, MD 05/18/2021 9:14:06 AM Number of Addenda: 0

## 2021-05-19 ENCOUNTER — Encounter (HOSPITAL_COMMUNITY): Payer: Self-pay | Admitting: Gastroenterology

## 2021-05-19 MED ORDER — MAGNESIUM OXIDE -MG SUPPLEMENT 400 (240 MG) MG PO TABS: 400.0000 mg | ORAL_TABLET | Freq: Every day | ORAL | 0 refills | Status: DC

## 2021-05-19 MED ORDER — THIAMINE HCL 100 MG PO TABS: 250.0000 mg | ORAL_TABLET | Freq: Every day | ORAL | 0 refills | Status: DC

## 2021-05-19 MED ORDER — AMOXICILLIN-POT CLAVULANATE 875-125 MG PO TABS: 1.0000 | ORAL_TABLET | Freq: Two times a day (BID) | ORAL | 0 refills | Status: AC

## 2021-05-19 MED ORDER — PANTOPRAZOLE SODIUM 40 MG PO TBEC: 40.0000 mg | DELAYED_RELEASE_TABLET | Freq: Every day | ORAL | 0 refills | Status: DC

## 2021-05-19 MED ORDER — TRAZODONE HCL 100 MG PO TABS: 200.0000 mg | ORAL_TABLET | Freq: Every evening | ORAL | 0 refills | Status: DC | PRN

## 2021-05-19 NOTE — Progress Notes (Signed)
Pt refused cpt

## 2021-05-19 NOTE — Care Management (Signed)
    Durable Medical Equipment  (From admission, onward)           Start     Ordered   05/19/21 1116  For home use only DME 3 n 1  Once        05/19/21 1115   05/19/21 1114  For home use only DME standard manual wheelchair with seat cushion  Once       Comments: Patient suffers from wound which impairs their ability to perform daily activities like grooming in the home.  A cane will not resolve issue with performing activities of daily living. A wheelchair will allow patient to safely perform daily activities. Patient can safely propel the wheelchair in the home or has a caregiver who can provide assistance. Length of need 6 months . Accessories: elevating leg rests (ELRs), wheel locks, extensions and anti-tippers.   05/19/21 1115   05/19/21 1114  For home use only DME Hospital bed  Once       Question Answer Comment  Length of Need 6 Months   Patient has (list medical condition): Dysphagia, oropharyngeal phase , lower extremity wound   The above medical condition requires: Patient requires the ability to reposition frequently   Head must be elevated greater than: 30 degrees   Bed type Semi-electric      05/19/21 1115

## 2021-05-19 NOTE — Discharge Instructions (Signed)
No alcohol and no NSAIDS

## 2021-05-19 NOTE — Progress Notes (Signed)
Pt refused morning dose of alprazolam which RN planned to return to pyxis. After pt departure, RN found refused alprazolam in scrub pocket. RN explained situation to CN, Abby D. who witnessed RN waste the medication.

## 2021-05-19 NOTE — TOC Transition Note (Signed)
Transition of Care [facility]) - CM/SW Discharge Note   Patient Details  Name: [name] MRN: [id number] Date of Birth: [birth year]  [facility] CM/SW Contact:  Dessa Phi, RN Phone Number: 05/19/2021, 11:56 AM   Clinical Narrative:  Nsg-please make arrangements for family member to come in for teaching of wound care prior d/c, & document return demo. dme to be delivered to house by [facility]-patient agrees to d/c prior dme delivery. [place] arranged-informed patient they teach services, not do wound care.Patient declines [facility] wants w/c transport-Nsg arrange [place] w/c transport.No further CM needs.    Final next level of care: [place] Barriers to Discharge: No Barriers Identified   Patient Goals and CMS Choice Patient states their goals for this hospitalization and ongoing recovery are:: go home w/HHC CMS Medicare.gov Compare Post Acute Care list provided to:: Patient Represenative (must comment) (cousin Verdene Lennert [phone]) Choice offered to / list presented to :  (cousin [place])  Discharge Placement                       Discharge Plan and Services   Discharge Planning Services: CM Consult Post Acute Care Choice: Home Health          DME Arranged: 3-N-1, Hospital bed, Wheelchair manual DME Agency: [employer] Date DME Agency Contacted: 05/19/21 Time DME Agency Contacted: [id number] Representative spoke with at DME Agency: Thedore Mins HH Arranged: RN, PT, Nurse's Aide [place] Agency: [place] Date [place]: 05/18/21 Time Delta: 1255 Representative spoke with at [place]: [place] ([place]) Interventions     Readmission Risk Interventions Readmission Risk Prevention Plan 11/01/2019  Home Care Screening Complete  Medication Review (RN CM) Complete  Some recent data might be hidden

## 2021-05-19 NOTE — Progress Notes (Signed)
RN spoke with family member to provide wound care instructions. Family member stated understanding.   NT removed IV. RN reviewed discharge paperwork with patient.   All vital signs stable.  RN transported patient via wheelchair to main entrance where [facility] transportation services picked up patient to take him home.

## 2021-05-19 NOTE — Discharge Summary (Signed)
Physician Discharge Summary  [name] QRF:[id number] DOB: 1986-10-08 DOA: 05/07/2021  PCP: [name], MD  Admit date: 05/07/2021 Discharge date: 05/19/2021  Admitted From: home Discharge disposition: home   Recommendations for Outpatient Follow-Up:   Wound care Increased home health DME ordered   Discharge Diagnosis:   Principal Problem:   Acute metabolic encephalopathy Active Problems:   Schizoaffective disorder, depressive type (HCC)   Severe alcohol dependence (HCC)   GERD (gastroesophageal reflux disease)   Elevated LFTs   HIV disease ([place])   Anemia   Cellulitis   Left hip pain   Transaminitis    Discharge Condition: Improved.  Diet recommendation: Low sodium, heart healthy.  Carbohydrate-modified.  Regular.  Wound care: None.  Code status: Full.   History of Present Illness:   Patient is a 35 year old male with history of HIV, PTSD, schizoaffective disorder, seizures, heavy alcohol abuse, hypertension was brought to ED via EMS for altered mental status.  I was unable to obtain any history from the patient due to his mental status (whispering and rambling).  He was able to tell me his name.  Per EMS and EDP records, patient lives alone.  Patient's family called EMS because he was not taking his medications.  He was laying on the couch for the last 2 weeks, covered in feces and urine.  Patient was surrounded by many alcohol bottles.  He was also noted to have increased lower extremity swelling, sores and redness to the feet   Hospital Course by Problem:   acute metabolic encephalopathy -Questionable etiology. -Could possibly be secondary to Wernicke's encephalopathy/Korsakoff syndrome versus hepatic encephalopathy versus secondary to infectious etiology/cellulitis.  Patient noted to have underlying psychiatric history. -CT head done negative for any acute abnormalities except increase fluid in the right ethmoid air cells. -Ammonia level at  30, TSH 1.1, B1 pending.,  Vitamin B12 at 2821. -Patient noted to have completed high-dose thiamine 500 mg 3 times daily x3 days, currently on thiamine 250 mg daily. -Mental status improved and likely close to baseline.   -Patient was being followed by psychiatry who have signed off.  Cellulitis bilateral lower extremity wounds, sacral/gluteal/thigh wound, POA -Patient noted to have some lower extremity edema felt likely secondary to severe hypoalbuminemia and third spacing. -2D echo with normal EF of 60 to 82%, grade 1 diastolic dysfunction. -Lower extremity Dopplers negative for DVT. -IV antibiotics of vancomycin and Rocephin were narrowed to IV Unasyn and subsequently transitioned to oral Augmentin per ID to complete course of treatment. -General surgery was following however signed off but they state no drainable abscess noted.  Micrococcus bacteremia -Repeat blood cultures pending with no growth to date x4 days. -Was on IV vancomycin and transitioned to IV Unasyn and subsequently transitioned to oral Augmentin per ID to complete course of antibiotic treatment: Plan for 2 weeks of therapy 7/29-8/11 to complete course of therapy for skin and soft tissue infection and possible uncomplicated bacteremia  Acute on chronic anemia/microcytic anemia/thrombocytopenia -Likely alcohol induced. -FOBT negative 05/07/2021, 05/15/2021 -Anemia panel consistent with anemia of chronic disease. -Patient noted to have some bleeding from wound on posterior thighs, gluteal and sacral area. -Status post transfusion 3 units packed red blood cells . -Hemoglobin was 6.7 on 05/15/2021 status post transfusion of 2 units packed red blood cells  -Patient seen in consultation by GI: s/p EGD  transaminitis with elevated alk phosphatase, AST, thrombocytopenia -Likely alcohol induced. -Abdominal ultrasound negative for cholelithiasis or biliary duct dilatation however consistent  with hepatic steatosis. -LFTs  improving. -Outpatient follow-up.  Acute kidney injury -Likely secondary to prerenal azotemia in the setting of poor oral intake and intravascular depletion. -Improved with hydration.     Hypokalemia/hypomagnesemia -Repleted   History of alcohol abuse -No signs of withdrawal noted. -Status post high-dose thiamine.   -Continue thiamine, folic acid.   -Ativan withdrawal protocol has been discontinued.   HIV disease -Patient noted to be noncompliant with antiretrovirals. -Continue valacyclovir, Biktarvy.   -Outpatient follow-up with ID  Schizoaffective disorder, depressive disorder -Stable.   -Continue current regimen of Lexapro, Latuda, Xanax.   -Psychiatry was following but have signed off.   -Outpatient follow-up with psychiatry.    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 -Plain films of the left hip showed chronic and absent fracture of left femoral head similar to prior exam. -Patient noted to have been scheduled for surgery on 05/18/2021 with orthopedics, Dr. Alvan Dame for total hip arthroplasty. -Patient has been seen in consultation by orthopedics who feel patient is at significant high risk for surgery based on medical comorbidities and now recommending rescheduling surgery once issues have been addressed. -Outpatient follow-up with orthopedics   Patient refused LTAC and SNF-- preferred to go home  Medical Consultants:   GI GS ID psych   Discharge Exam:   Vitals:   05/18/21 2054 05/19/21 0829  BP:    Pulse:    Resp:    Temp:    SpO2: 94% 93%   Vitals:   05/18/21 1502 05/18/21 2025 05/18/21 2054 05/19/21 0829  BP: 104/64 (!) 112/57    Pulse: (!) 101 93    Resp: 18 18    Temp: 98.3 F (36.8 C) 97.9 F (36.6 C)    TempSrc: Oral Axillary    SpO2: 94% 95% 94% 93%  Weight:      Height:        General exam:  Appears calm and comfortable.     The results of significant diagnostics from this hospitalization (including imaging, microbiology, ancillary and laboratory) are listed below for reference.     Procedures and Diagnostic Studies:   CT PELVIS W CONTRAST  Result Date: 05/10/2021 CLINICAL DATA:  Buttock and thigh cellulitis, concern for undrained abscess. EXAM: CT PELVIS WITH CONTRAST TECHNIQUE: Multidetector CT imaging of the pelvis was performed using the standard protocol following the bolus administration of intravenous contrast. CONTRAST:  131m OMNIPAQUE IOHEXOL 350 MG/ML SOLN COMPARISON:  CT 09/10/2020 FINDINGS: Urinary Tract: Circumferential thickening of the urinary bladder, greater than expected for underdistention. Hazy perivesicular stranding. Bowel: Some questionable intramural fatty infiltration versus mural thickening of the cecum. No other conspicuous bowel wall thickening or dilatation. No evidence of obstruction within the included margins of imaging. High attenuation contrast media noted within the distal colon and partially opacifying the cecum as well. Streak artifact is noted about the deep pelvis likely related to combination of body habitus and high attenuation contrast media in this vicinity (2/26) Vascular/Lymphatic: Vascular calcifications in the aortic bifurcation included vasculature. Reactive appearing adenopathy in the inguinal chains. Reproductive: The prostate and seminal vesicles are unremarkable. No acute abnormality the included external genitalia. Other: Diffuse edematous changes of the soft tissues most pronounced laterally and along the posterior gluteal soft tissues as well as the posterior thighs and mons pubis. More focal regions of irregular skin thickening seen posteriorly  as well as several superficial foci of soft tissue gas in the left gluteal subcutaneous fat. No deeper extension of the soft tissue gas is seen. Small amount of contrast media seen within the  gluteal cleft. Small fat containing umbilical hernia. Question additional regions of injectable use the low anterior abdomen as well. 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 No acute osseous abnormality or suspicious osseous lesion. Musculature appears normal and symmetric. No intramuscular collection or abscess. No worrisome stranding or fluid nor soft tissue gas seen within the fascial planes. IMPRESSION: Extensive soft tissue edema with more irregular skin thickening and subcutaneous fat stranding posteriorly along the gluteal soft tissues and proximal thighs as well as the mons pubis anteriorly. Few foci soft tissue gas noted along the left posterior gluteal tissues and proximal thigh. Recommend correlation with visual inspection for regions of cellulitis and for sites of prior intervention. No organized abscess or collection is evident. Some additional nodular soft tissue thickening along the low anterior abdominal wall, possibly related to injectable use. Circumferential bladder wall thickening. Correlate with urinalysis to exclude cystitis. Thickening versus intramural fatty infiltration of the cecum, correlate with bowel symptoms. Beam hardening/streak artifact seen across the pelvis related to concentrated contrast media within the distal rectum. Appears artifactual. Should Electronically Signed   By: Lovena Le M.D.   On: 05/10/2021 19:30     Labs:   Basic Metabolic Panel: Recent Labs  Lab 05/13/21 0520 05/14/21 0457 05/15/21 0801 05/16/21 0916 05/17/21 0417  NA 139 137 136 140 141  K 3.9 3.2* 3.6 3.4* 4.1  CL 111 111 107 108 109  CO2 24 23 24 27 26   GLUCOSE 88 117* 82 81 87  BUN 11 10 10 9 9   CREATININE 1.28* 1.29* 1.30* 1.32* 1.26*  CALCIUM 7.9* 7.9* 7.7* 8.1* 8.6*  MG 2.0  --  1.6* 2.1 1.8   GFR Estimated Creatinine Clearance: 106.6 mL/min (A) (by C-G formula based on SCr of 1.26 mg/dL (H)). Liver  Function Tests: Recent Labs  Lab 05/17/21 0417  AST 19  ALT 17  ALKPHOS 142*  BILITOT 0.4  PROT 7.7  ALBUMIN 1.4*   No results for input(s): LIPASE, AMYLASE in the last 168 hours. No results for input(s): AMMONIA in the last 168 hours. Coagulation profile No results for input(s): INR, PROTIME in the last 168 hours.  CBC: Recent Labs  Lab 05/13/21 0520 05/14/21 0457 05/15/21 0801 05/15/21 2336 05/17/21 0417 05/18/21 0419  WBC 15.3* 16.3* 14.0* 15.3* 14.3* 11.1*  NEUTROABS 10.8* 11.6* 9.1*  --  9.9*  --   HGB 8.3* 7.8* 6.7* 9.3* 8.9* 9.2*  HCT 26.9* 25.7* 22.1* 29.4* 28.2* 30.0*  MCV 103.9* 106.6* 104.7* 100.0 100.7* 103.8*  PLT 141* 151 158 185 210 213   Cardiac Enzymes: No results for input(s): CKTOTAL, CKMB, CKMBINDEX, TROPONINI in the last 168 hours. BNP: Invalid input(s): POCBNP CBG: No results for input(s): GLUCAP in the last 168 hours. D-Dimer No results for input(s): DDIMER in the last 72 hours. Hgb A1c No results for input(s): HGBA1C in the last 72 hours. Lipid Profile No results for input(s): CHOL, HDL, LDLCALC, TRIG, CHOLHDL, LDLDIRECT in the last 72 hours. Thyroid function studies No results for input(s): TSH, T4TOTAL, T3FREE, THYROIDAB in the last 72 hours.  Invalid input(s): FREET3 Anemia work up No results for input(s): VITAMINB12, FOLATE, FERRITIN, TIBC, IRON, RETICCTPCT in the last 72 hours. Microbiology Recent Results (from the past 240 hour(s))  Culture, blood (routine x 2)     Status: None   Collection Time: 05/12/21  9:30 AM   Specimen: BLOOD  Result Value Ref Range Status   Specimen Description   Final    BLOOD RIGHT ANTECUBITAL Performed at [facility] [address], [place], [place] [address]    Special Requests   Final    BOTTLES DRAWN AEROBIC AND ANAEROBIC Blood Culture adequate volume Performed at [facility] [address] [place], [place] [address]    Culture   Final    NO GROWTH 6  DAYS Performed at [facility], [place] [address] [place], [place] [address]    Report Status 05/18/2021 FINAL  Final  Culture, blood (routine x 2)     Status: None   Collection Time: 05/12/21  9:30 AM   Specimen: BLOOD  Result Value Ref Range Status   Specimen Description   Final    BLOOD RT HAND Performed at [facility] [address] [place], [place] [address]    Special Requests   Final    BOTTLES DRAWN AEROBIC ONLY Blood Culture adequate volume Performed at [facility] [address] [place], [place] [address]    Culture   Final    NO GROWTH 6 DAYS Performed at [facility], [place] [address], [place], [place] [address]    Report Status 05/18/2021 FINAL  Final     Discharge Instructions:   Discharge Instructions     Discharge instructions   Complete by: As directed    regular foods - and slightly thicker drinks with meals (between nectar and thin) - as he overtly coughs with thins and it can cause discomfort.  Recommend he have thin water between meals as he tolerates to assure staying hydrated.   Discharge wound care:   Complete by: As directed    Wash bilateral posterior thighs with soap and water, dry. Place multiple ABD pads over all draining areas, secure with tape. Change twice daily and with soilage   Increase activity slowly   Complete by: As directed       Allergies as of 05/19/2021       Reactions   Dapsone Other (See Comments)   Per centricity "G6PD deficient"   Primaquine Phosphate Other (See Comments)   Per Centricity "G6PD deficient"        Medication List     STOP taking these medications    cetirizine 10 MG tablet Commonly known as: ZYRTEC   Clindamycin-Benzoyl Per (Refr) gel   famotidine 40 MG tablet Commonly known as: PEPCID   haloperidol 5 MG tablet Commonly known as: HALDOL   hydrOXYzine 50 MG tablet Commonly known as: ATARAX/VISTARIL   meclizine 25 MG tablet Commonly known  as: ANTIVERT   thiamine 100 MG tablet Commonly known as: Vitamin B-1 Replaced by: thiamine 100 MG tablet   topiramate 100 MG tablet Commonly known as: TOPAMAX   zolpidem 10 MG tablet Commonly known as: AMBIEN       TAKE these medications    albuterol 108 (90 Base) MCG/ACT inhaler Commonly known as: VENTOLIN HFA Inhale 2 puffs into the lungs every 6 (six) hours as needed for wheezing or shortness of breath.   alprazolam 2 MG tablet Commonly known as: XANAX Take 2 mg by mouth 3 (three) times daily.   amoxicillin-clavulanate 875-125 MG tablet Commonly known as: AUGMENTIN Take 1 tablet by mouth every 12 (twelve) hours for 2  days. What changed: when to take this   benzonatate 100 MG capsule Commonly known as: TESSALON Take 200 mg by mouth every 8 (eight) hours as needed for cough.   bictegravir-emtricitabine-tenofovir AF 50-200-25 MG Tabs tablet Commonly known as: BIKTARVY Take 1 tablet by mouth daily.   diphenoxylate-atropine 2.5-0.025 MG tablet Commonly known as: LOMOTIL Take 1 tablet by mouth every 8 (eight) hours as needed for diarrhea or loose stools.   escitalopram 20 MG tablet Commonly known as: LEXAPRO Take 1 tablet by mouth daily.   folic acid 1 MG tablet Commonly known as: FOLVITE Take 1 mg by mouth daily.   furosemide 40 MG tablet Commonly known as: LASIX Take 40 mg by mouth daily as needed for fluid.   Lurasidone HCl 120 MG Tabs Take 1 tablet (120 mg total) by mouth daily.   magnesium oxide 400 (240 Mg) MG tablet Commonly known as: MAG-OX Take 1 tablet (400 mg total) by mouth daily.   ondansetron 8 MG tablet Commonly known as: ZOFRAN Take 8 mg by mouth every 8 (eight) hours as needed for nausea/vomiting, nausea or vomiting.   pantoprazole 40 MG tablet Commonly known as: PROTONIX Take 1 tablet (40 mg total) by mouth daily. Start taking on: May 20, 2021 What changed: when to take this   thiamine 100 MG tablet Take 2.5 tablets (250 mg  total) by mouth daily. Start taking on: May 20, 2021 Replaces: thiamine 100 MG tablet   traZODone 100 MG tablet Commonly known as: DESYREL Take 2 tablets (200 mg total) by mouth at bedtime as needed for sleep. What changed:  medication strength how much to take   valACYclovir 1000 MG tablet Commonly known as: VALTREX Take 1 tablet (1,000 mg total) by mouth daily.               Durable Medical Equipment  (From admission, onward)           Start     Ordered   05/19/21 1116  For home use only DME 3 n 1  Once        05/19/21 1115   05/19/21 1114  For home use only DME standard manual wheelchair with seat cushion  Once       Comments: Patient suffers from wound which impairs their ability to perform daily activities like grooming in the home.  A cane will not resolve issue with performing activities of daily living. A wheelchair will allow patient to safely perform daily activities. Patient can safely propel the wheelchair in the home or has a caregiver who can provide assistance. Length of need 6 months . Accessories: elevating leg rests (ELRs), wheel locks, extensions and anti-tippers.   05/19/21 1115   05/19/21 1114  For home use only DME Hospital bed  Once       Question Answer Comment  Length of Need 6 Months   Patient has (list medical condition): Dysphagia, oropharyngeal phase , lower extremity wound   The above medical condition requires: Patient requires the ability to reposition frequently   Head must be elevated greater than: 30 degrees   Bed type Semi-electric      05/19/21 1115              Discharge Care Instructions  (From admission, onward)           Start     Ordered   05/19/21 0000  Discharge wound care:       Comments: Wash bilateral posterior thighs with soap  and water, dry. Place multiple ABD pads over all draining areas, secure with tape. Change twice daily and with soilage   05/19/21 1219            Follow-up Information      [place] [place] Follow up.   Specialty: [place] Why: [facility] nursing/physical therapy/aide Contact information: [address] [place] [address] [phone]         [place] [place] .   Specialty: Home Health Services Contact information: [place] [address] [phone]         [facility] Follow up.   Why: w/c,hospital bed, bedside commode Contact information: [place] [address] [phone]                  Time coordinating discharge: 35 min  Signed:  Geradine Girt DO  [facility] 05/19/2021, 12:20 PM

## 2021-08-23 ENCOUNTER — Encounter (HOSPITAL_COMMUNITY): Payer: Self-pay

## 2021-08-23 ENCOUNTER — Inpatient Hospital Stay (HOSPITAL_COMMUNITY)
Admission: EM | Admit: 2021-08-23 | Discharge: 2021-08-31 | DRG: 974 | Disposition: A | Discharge status: Home Health | Payer: Medicare HMO | Attending: Internal Medicine | Admitting: Internal Medicine

## 2021-08-23 ENCOUNTER — Emergency Department (HOSPITAL_COMMUNITY): Payer: Medicare HMO

## 2021-08-23 DIAGNOSIS — F17210 Nicotine dependence, cigarettes, uncomplicated: Secondary | ICD-10-CM | POA: Diagnosis present

## 2021-08-23 DIAGNOSIS — Y929 Unspecified place or not applicable: Secondary | ICD-10-CM | POA: Diagnosis not present

## 2021-08-23 DIAGNOSIS — Z888 Allergy status to other drugs, medicaments and biological substances status: Secondary | ICD-10-CM

## 2021-08-23 DIAGNOSIS — R0902 Hypoxemia: Secondary | ICD-10-CM

## 2021-08-23 DIAGNOSIS — L03317 Cellulitis of buttock: Secondary | ICD-10-CM | POA: Diagnosis present

## 2021-08-23 DIAGNOSIS — Y900 Blood alcohol level of less than 20 mg/100 ml: Secondary | ICD-10-CM | POA: Diagnosis present

## 2021-08-23 DIAGNOSIS — I10 Essential (primary) hypertension: Secondary | ICD-10-CM | POA: Diagnosis present

## 2021-08-23 DIAGNOSIS — E871 Hypo-osmolality and hyponatremia: Secondary | ICD-10-CM | POA: Diagnosis present

## 2021-08-23 DIAGNOSIS — Z20822 Contact with and (suspected) exposure to covid-19: Secondary | ICD-10-CM | POA: Diagnosis present

## 2021-08-23 DIAGNOSIS — R6520 Severe sepsis without septic shock: Secondary | ICD-10-CM | POA: Diagnosis not present

## 2021-08-23 DIAGNOSIS — F319 Bipolar disorder, unspecified: Secondary | ICD-10-CM | POA: Diagnosis present

## 2021-08-23 DIAGNOSIS — K219 Gastro-esophageal reflux disease without esophagitis: Secondary | ICD-10-CM | POA: Diagnosis present

## 2021-08-23 DIAGNOSIS — W1830XA Fall on same level, unspecified, initial encounter: Secondary | ICD-10-CM | POA: Diagnosis present

## 2021-08-23 DIAGNOSIS — Z8616 Personal history of COVID-19: Secondary | ICD-10-CM | POA: Diagnosis not present

## 2021-08-23 DIAGNOSIS — Z6827 Body mass index (BMI) 27.0-27.9, adult: Secondary | ICD-10-CM | POA: Diagnosis not present

## 2021-08-23 DIAGNOSIS — E860 Dehydration: Secondary | ICD-10-CM | POA: Diagnosis present

## 2021-08-23 DIAGNOSIS — F10229 Alcohol dependence with intoxication, unspecified: Secondary | ICD-10-CM | POA: Diagnosis present

## 2021-08-23 DIAGNOSIS — Z23 Encounter for immunization: Secondary | ICD-10-CM | POA: Diagnosis present

## 2021-08-23 DIAGNOSIS — D6959 Other secondary thrombocytopenia: Secondary | ICD-10-CM | POA: Diagnosis present

## 2021-08-23 DIAGNOSIS — E861 Hypovolemia: Secondary | ICD-10-CM | POA: Diagnosis present

## 2021-08-23 DIAGNOSIS — E876 Hypokalemia: Secondary | ICD-10-CM | POA: Diagnosis not present

## 2021-08-23 DIAGNOSIS — B20 Human immunodeficiency virus [HIV] disease: Secondary | ICD-10-CM | POA: Diagnosis present

## 2021-08-23 DIAGNOSIS — A419 Sepsis, unspecified organism: Secondary | ICD-10-CM | POA: Diagnosis present

## 2021-08-23 DIAGNOSIS — F251 Schizoaffective disorder, depressive type: Secondary | ICD-10-CM | POA: Diagnosis present

## 2021-08-23 DIAGNOSIS — F4310 Post-traumatic stress disorder, unspecified: Secondary | ICD-10-CM | POA: Diagnosis present

## 2021-08-23 DIAGNOSIS — E669 Obesity, unspecified: Secondary | ICD-10-CM | POA: Diagnosis present

## 2021-08-23 DIAGNOSIS — R627 Adult failure to thrive: Secondary | ICD-10-CM | POA: Diagnosis present

## 2021-08-23 DIAGNOSIS — T68XXXA Hypothermia, initial encounter: Secondary | ICD-10-CM

## 2021-08-23 DIAGNOSIS — E43 Unspecified severe protein-calorie malnutrition: Secondary | ICD-10-CM | POA: Diagnosis present

## 2021-08-23 DIAGNOSIS — G928 Other toxic encephalopathy: Secondary | ICD-10-CM | POA: Diagnosis present

## 2021-08-23 DIAGNOSIS — R6521 Severe sepsis with septic shock: Secondary | ICD-10-CM | POA: Diagnosis present

## 2021-08-23 DIAGNOSIS — G629 Polyneuropathy, unspecified: Secondary | ICD-10-CM | POA: Diagnosis present

## 2021-08-23 DIAGNOSIS — D510 Vitamin B12 deficiency anemia due to intrinsic factor deficiency: Secondary | ICD-10-CM | POA: Diagnosis not present

## 2021-08-23 DIAGNOSIS — I959 Hypotension, unspecified: Secondary | ICD-10-CM | POA: Diagnosis not present

## 2021-08-23 DIAGNOSIS — D649 Anemia, unspecified: Secondary | ICD-10-CM | POA: Diagnosis present

## 2021-08-23 DIAGNOSIS — K7682 Hepatic encephalopathy: Secondary | ICD-10-CM | POA: Diagnosis present

## 2021-08-23 DIAGNOSIS — Z79899 Other long term (current) drug therapy: Secondary | ICD-10-CM

## 2021-08-23 DIAGNOSIS — Z818 Family history of other mental and behavioral disorders: Secondary | ICD-10-CM

## 2021-08-23 DIAGNOSIS — S01111A Laceration without foreign body of right eyelid and periocular area, initial encounter: Secondary | ICD-10-CM | POA: Diagnosis present

## 2021-08-23 DIAGNOSIS — Z811 Family history of alcohol abuse and dependence: Secondary | ICD-10-CM

## 2021-08-23 LAB — CBC WITH DIFFERENTIAL/PLATELET
Abs Immature Granulocytes: 0.14 10*3/uL — ABNORMAL HIGH (ref 0.00–0.07)
Basophils Absolute: 0 10*3/uL (ref 0.0–0.1)
Basophils Relative: 0 %
Eosinophils Absolute: 0.1 10*3/uL (ref 0.0–0.5)
Eosinophils Relative: 0 %
HCT: 20.8 % — ABNORMAL LOW (ref 39.0–52.0)
Hemoglobin: 6.7 g/dL — CL (ref 13.0–17.0)
Immature Granulocytes: 1 %
Lymphocytes Relative: 13 %
Lymphs Abs: 1.9 10*3/uL (ref 0.7–4.0)
MCH: 33.7 pg (ref 26.0–34.0)
MCHC: 32.2 g/dL (ref 30.0–36.0)
MCV: 104.5 fL — ABNORMAL HIGH (ref 80.0–100.0)
Monocytes Absolute: 0.9 10*3/uL (ref 0.1–1.0)
Monocytes Relative: 6 %
Neutro Abs: 12 10*3/uL — ABNORMAL HIGH (ref 1.7–7.7)
Neutrophils Relative %: 80 %
Platelets: 114 10*3/uL — ABNORMAL LOW (ref 150–400)
RBC: 1.99 MIL/uL — ABNORMAL LOW (ref 4.22–5.81)
RDW: 18.9 % — ABNORMAL HIGH (ref 11.5–15.5)
WBC: 15 10*3/uL — ABNORMAL HIGH (ref 4.0–10.5)
nRBC: 0 % (ref 0.0–0.2)

## 2021-08-23 LAB — COMPREHENSIVE METABOLIC PANEL
ALT: 68 U/L — ABNORMAL HIGH (ref 0–44)
AST: 51 U/L — ABNORMAL HIGH (ref 15–41)
Albumin: <1.5 g/dL — ABNORMAL LOW (ref 3.5–5.0)
Alkaline Phosphatase: 279 U/L — ABNORMAL HIGH (ref 38–126)
Anion gap: 7 (ref 5–15)
BUN: 8 mg/dL (ref 6–20)
CO2: 21 mmol/L — ABNORMAL LOW (ref 22–32)
Calcium: 7.7 mg/dL — ABNORMAL LOW (ref 8.9–10.3)
Chloride: 101 mmol/L (ref 98–111)
Creatinine, Ser: 1.26 mg/dL — ABNORMAL HIGH (ref 0.61–1.24)
GFR, Estimated: >60 mL/min (ref >60)
Glucose, Bld: 122 mg/dL — ABNORMAL HIGH (ref 70–99)
Potassium: 3.7 mmol/L (ref 3.5–5.1)
Sodium: 129 mmol/L — ABNORMAL LOW (ref 135–145)
Total Bilirubin: 0.9 mg/dL (ref 0.3–1.2)
Total Protein: 6.6 g/dL (ref 6.5–8.1)

## 2021-08-23 LAB — LACTIC ACID, PLASMA
Lactic Acid, Venous: 3.5 mmol/L (ref 0.5–1.9)
Lactic Acid, Venous: 1.9 mmol/L (ref 0.5–1.9)

## 2021-08-23 LAB — RESP PANEL BY RT-PCR (FLU A&B, COVID) ARPGX2
Influenza A by PCR: NEGATIVE
Influenza B by PCR: NEGATIVE
SARS Coronavirus 2 by RT PCR: NEGATIVE

## 2021-08-23 LAB — PROTIME-INR
INR: 1.5 — ABNORMAL HIGH (ref 0.8–1.2)
Prothrombin Time: 17.8 seconds — ABNORMAL HIGH (ref 11.4–15.2)

## 2021-08-23 LAB — APTT: aPTT: 35 seconds (ref 24–36)

## 2021-08-23 LAB — PREPARE RBC (CROSSMATCH)

## 2021-08-23 MED ORDER — SODIUM CHLORIDE 0.9 % IV SOLN
10.0000 mL/h | Freq: Once | INTRAVENOUS | Status: AC
  Administered 2021-08-27: 10 mL/h via INTRAVENOUS

## 2021-08-23 MED ORDER — SODIUM CHLORIDE 0.9 % IV BOLUS (SEPSIS)
1000.0000 mL | Freq: Once | INTRAVENOUS | Status: AC
  Administered 2021-08-23: 1000 mL via INTRAVENOUS

## 2021-08-23 MED ORDER — SODIUM CHLORIDE 0.9 % IV SOLN
2.0000 g | Freq: Once | INTRAVENOUS | Status: AC
  Administered 2021-08-24: 2 g via INTRAVENOUS
  Filled 2021-08-23: qty 2

## 2021-08-23 MED ORDER — VANCOMYCIN HCL 2000 MG/400ML IV SOLN
2000.0000 mg | Freq: Once | INTRAVENOUS | Status: AC
  Administered 2021-08-23: 2000 mg via INTRAVENOUS
  Filled 2021-08-23: qty 400

## 2021-08-23 MED ORDER — METRONIDAZOLE 500 MG/100ML IV SOLN
500.0000 mg | Freq: Once | INTRAVENOUS | Status: AC
  Administered 2021-08-23: 500 mg via INTRAVENOUS
  Filled 2021-08-23: qty 100

## 2021-08-23 MED ORDER — SODIUM CHLORIDE 0.9 % IV BOLUS (SEPSIS)
1000.0000 mL | Freq: Once | INTRAVENOUS | Status: AC
  Administered 2021-08-24: 1000 mL via INTRAVENOUS

## 2021-08-23 MED ORDER — VANCOMYCIN HCL IN DEXTROSE 1-5 GM/200ML-% IV SOLN: 1000.0000 mg | Freq: Once | INTRAVENOUS | Status: DC

## 2021-08-23 NOTE — ED Triage Notes (Signed)
Pt to ED via [facility] from home s/p fall with laceration to right eyebrow, bleeding controlled.  Pt states his right leg gave out causing the fall, hx of right hip deterioration.  Pt has not taken medications or eaten food x 3 days, ETOH on board.

## 2021-08-23 NOTE — H&P (Signed)
NAME:  [name], MRN:  [id number], DOB:  Jan 27, 1986, LOS: 0 ADMISSION DATE:  08/23/2021, CONSULTATION DATE: 08/23/21 REFERRING MD:  Roderic Palau, CHIEF COMPLAINT:  fall, hypotension   History of Present Illness:  35 yo man with hx of HIV, PTSD, schizoaffective disorder, seizures, etoh abuse, HTN, here at [facility] for fall with laceration to R eyebrow.   Fall due to "R leg giving out".  No po intake x 3 days, +etoh.  On arrival hypothermic 92.3.  Initially hypotensive, improved with fluids.  Improved further with blood transfusion, no obvious source of bleeding present.  F/u cbc pending  In ed: started on Cefepime, flagyl, vanc 1L LR given.   Seen by ID doctor 06/16/21, plan to cont current HIV regimen with biktarvy.    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 rate 13, height 6' 3"  (1.905 m), weight 98 kg, SpO2 95 %.       No intake or output data in the 24 hours ending 08/23/21 2153 Filed Weights   08/23/21 1923  Weight: 98 kg    Examination: General: NAD, c-collar ,appears pale  HENT:  Lungs: CTAB Cardiovascular: RRR no mgr  Abdomen: nt, nd  Extremities:  Neuro: awake but not very interactive GU:   CXR: no acute  abnormality  Pelvis X ray:   IMPRESSION: Chronic changes of avascular necrosis and partial collapse of the left femoral head similar to that seen on prior CT. No acute abnormality is noted.   Echo:  Left ventricular ejection fraction, by estimation, is 60 to 65%. The  left ventricle has normal function. The left ventricle has no regional  wall motion abnormalities. Left ventricular diastolic parameters are  consistent with Grade I diastolic  dysfunction (impaired relaxation).   2. Right ventricular systolic function is normal. The right ventricular  size is normal.   3. The mitral valve is normal in structure. No evidence of mitral valve  regurgitation. No evidence of mitral stenosis.   4. The aortic valve is tricuspid. Aortic valve regurgitation is not  visualized. No aortic stenosis is present.   Resolved Hospital Problem list      Assessment & Plan:  Sepsis, hypotension.  ICU consult for hypotension, concern for sepsis.   Responded well to fluids.  Patient appears stable for admission to hospitalist team to stepdown unit.   UA, EKG and TSH pending.    Best Practice (right click and "Reselect all SmartList Selections" daily)   Labs   CBC: Recent Labs  Lab 08/23/21 1956  WBC 15.0*  NEUTROABS 12.0*  HGB 6.7*  HCT 20.8*  MCV 104.5*  PLT 114*    Basic Metabolic Panel: Recent Labs  Lab 08/23/21 1956  NA 129*  K  3.7  CL 101  CO2 21*  GLUCOSE 122*  BUN 8  CREATININE 1.26*  CALCIUM 7.7*   GFR: Estimated Creatinine Clearance: 95.9 mL/min (A) (by C-G formula based on SCr of 1.26 mg/dL (H)). Recent Labs  Lab 08/23/21 1956  WBC 15.0*  LATICACIDVEN 3.5*    Liver Function Tests: Recent Labs  Lab 08/23/21 1956  AST 51*  ALT 68*  ALKPHOS 279*  BILITOT 0.9  PROT 6.6  ALBUMIN <1.5*   No results for input(s): LIPASE, AMYLASE in the last 168 hours. No results for input(s): AMMONIA in the last 168 hours.  ABG    Component Value Date/Time   PHART  7.339 (L) 10/28/2019 0916   PCO2ART 48.2 (H) 10/28/2019 0916   PO2ART 81.0 (L) 10/28/2019 0916   HCO3 26.0 10/28/2019 0916   TCO2 27 10/28/2019 0916   ACIDBASEDEF 2.0 10/28/2019 0550   O2SAT 95.0 10/28/2019 0916     Coagulation Profile: Recent Labs  Lab 08/23/21 1956  INR 1.5*    Cardiac Enzymes: No results for input(s): CKTOTAL, CKMB, CKMBINDEX, TROPONINI in the last 168 hours.  HbA1C: Hgb A1c MFr Bld  Date/Time Value Ref Range Status  10/28/2019 06:10 PM 6.1 (H) 4.8 - 5.6 % Final    Comment:    (NOTE) Pre diabetes:          5.7%-6.4% Diabetes:              >6.4% Glycemic control for   <7.0% adults with diabetes     CBG: No results for input(s): GLUCAP in the last 168 hours.  Review of Systems:   Unable   Past Medical History:  He,  has a past medical history of Bipolar 1 disorder ([place]), Depression, Dizziness and giddiness (02/01/2016), Herpes genitalia, HIV disease ([place]), Hypertension, Migraine headache (02/01/2016), Peripheral neuropathy (10/01/2019), PTSD (post-traumatic stress disorder), Schizoaffective disorder ([place]), and Seizures ([place]).   Surgical History:   Past Surgical History:  Procedure Laterality Date   BACK SURGERY     BIOPSY  02/26/2021   Procedure: BIOPSY;  Surgeon: Otis Brace, MD;  Location: [facility];  Service: Gastroenterology;;   COLONOSCOPY WITH PROPOFOL [name] 02/26/2021   Procedure: COLONOSCOPY WITH PROPOFOL;  Surgeon: Otis Brace, MD;  Location: [facility];  Service: Gastroenterology;  Laterality: [name];   ESOPHAGOGASTRODUODENOSCOPY (EGD) WITH PROPOFOL [name] 02/26/2021   Procedure: ESOPHAGOGASTRODUODENOSCOPY (EGD) WITH PROPOFOL;  Surgeon: Otis Brace, MD;  Location: [facility];  Service: Gastroenterology;  Laterality: [name];   ESOPHAGOGASTRODUODENOSCOPY (EGD) WITH PROPOFOL [name] 05/18/2021   Procedure: ESOPHAGOGASTRODUODENOSCOPY (EGD) WITH PROPOFOL;  Surgeon: Otis Brace, MD;  Location: [facility];  Service:  Gastroenterology;  Laterality: [name];   HAND SURGERY       Social History:   reports that he has been smoking cigarettes. He has been smoking an average of 1 pack per day. He has never used smokeless tobacco. He reports current alcohol use of about 14.0 standard drinks per week. He reports that he does not use drugs.   Family History:  His family history includes Alcohol abuse in his father, mother, paternal uncle, and paternal uncle; Depression in his father; Schizophrenia in his father.   Allergies Allergies  Allergen Reactions   Dapsone Other (See Comments)    Per centricity "G6PD deficient"   Primaquine Phosphate Other (See Comments)    Per Centricity "G6PD deficient"     Home Medications  Prior to Admission medications   Medication Sig Start Date End Date Taking? Authorizing Provider  albuterol (PROVENTIL HFA;VENTOLIN HFA)  108 (90 BASE) MCG/ACT inhaler Inhale 2 puffs into the lungs every 6 (six) hours as needed for wheezing or shortness of breath.    [provider]  alprazolam Duanne Moron) 2 MG tablet Take 2 mg by mouth 3 (three) times daily.    [provider]  benzonatate (TESSALON) 100 MG capsule Take 200 mg by mouth every 8 (eight) hours as needed for cough. 10/23/19   [provider]  bictegravir-emtricitabine-tenofovir AF (BIKTARVY) 50-200-25 MG TABS tablet Take 1 tablet by mouth daily.  02/24/17   [provider]  diphenoxylate-atropine (LOMOTIL) 2.5-0.025 MG tablet Take 1 tablet by mouth every 8 (eight) hours as needed for diarrhea or loose stools. 02/16/21   [provider]  escitalopram (LEXAPRO) 20 MG tablet Take 1 tablet by mouth daily. 02/16/21   [provider]  folic acid (FOLVITE) 1 MG tablet Take 1 mg by mouth daily. 10/03/19   [provider]  furosemide (LASIX) 40 MG tablet Take 40 mg by mouth daily as needed for fluid. 02/27/20   [provider]  lurasidone 120 MG TABS Take 1 tablet (120 mg total) by  mouth daily. 05/15/15   Niel Hummer, NP  magnesium oxide (MAG-OX) 400 (240 Mg) MG tablet Take 1 tablet (400 mg total) by mouth daily. 05/19/21   Geradine Girt, DO  ondansetron (ZOFRAN) 8 MG tablet Take 8 mg by mouth every 8 (eight) hours as needed for nausea/vomiting, nausea or vomiting. 10/03/19   [provider]  pantoprazole (PROTONIX) 40 MG tablet Take 1 tablet (40 mg total) by mouth daily. 05/20/21   Geradine Girt, DO  thiamine 100 MG tablet Take 2.5 tablets (250 mg total) by mouth daily. 05/20/21   Geradine Girt, DO  traZODone (DESYREL) 100 MG tablet Take 2 tablets (200 mg total) by mouth at bedtime as needed for sleep. 05/19/21   Geradine Girt, DO  valACYclovir (VALTREX) 1000 MG tablet Take 1 tablet (1,000 mg total) by mouth daily. 05/15/15   Niel Hummer, NP     Critical care time: 35 minutes

## 2021-08-23 NOTE — Sepsis Progress Note (Signed)
Elink following for Sepsis Protocol 

## 2021-08-23 NOTE — ED Provider Notes (Signed)
[place] DEPT Provider Note   CSN: [id number] Arrival date & time: 08/23/21  1907     History Chief Complaint  Patient presents with   Laceration    [name] is a 35 y.o. male.  Patient was brought to the emergency department for a fall.  He has avascular necrosis of the right hip.  He also has HIV.  When he arrived his temperature was 92.3.  The history is provided by the patient and the EMS personnel. No language interpreter was used.  Fall This is a new problem. The current episode started 1 to 2 hours ago. The problem occurs rarely. The problem has been resolved. Pertinent negatives include no chest pain, no abdominal pain and no headaches. Nothing aggravates the symptoms. Nothing relieves the symptoms. He has tried nothing for the symptoms.      Past Medical History:  Diagnosis Date   Bipolar 1 disorder ([place])    Depression    Dizziness and giddiness 02/01/2016   Herpes genitalia    HIV disease ([place])    Hypertension    Migraine headache 02/01/2016   Peripheral neuropathy 10/01/2019   PTSD (post-traumatic stress disorder)    Schizoaffective disorder ([place])    Seizures ([place])     Patient Active Problem List   Diagnosis Date Noted   Left hip pain    Transaminitis    Acute metabolic encephalopathy 63/84/6659   Cellulitis 05/07/2021   Lymphadenopathy 09/29/2020   Anemia    Upper urinary tract infection    Sepsis secondary to UTI ([place]) 03/14/2020   Cellulitis of groin 03/14/2020   Elevated LFTs 03/14/2020   HIV disease ([place])    Acute encephalopathy    Hyponatremia    Macrocytic anemia    Thrombocytopenia ([facility])    Prolonged QT interval    Acute respiratory failure due to COVID-19 ([place]) 10/27/2019   GERD (gastroesophageal reflux disease) 10/27/2019   Hypokalemia 10/27/2019   Peripheral neuropathy 10/01/2019   PTSD (post-traumatic stress disorder) 07/23/2018   Dizziness and giddiness 02/01/2016   Migraine headache  02/01/2016   Schizoaffective disorder, depressive type ([place]) 05/13/2015   Severe alcohol dependence ([place]) 05/13/2015   Suicidal ideation 01/12/2014    Past Surgical History:  Procedure Laterality Date   BACK SURGERY     BIOPSY  02/26/2021   Procedure: BIOPSY;  Surgeon: Otis Brace, MD;  Location: [facility];  Service: Gastroenterology;;   COLONOSCOPY WITH PROPOFOL [name] 02/26/2021   Procedure: COLONOSCOPY WITH PROPOFOL;  Surgeon: Otis Brace, MD;  Location: [facility];  Service: Gastroenterology;  Laterality: [name];   ESOPHAGOGASTRODUODENOSCOPY (EGD) WITH PROPOFOL [name] 02/26/2021   Procedure: ESOPHAGOGASTRODUODENOSCOPY (EGD) WITH PROPOFOL;  Surgeon: Otis Brace, MD;  Location: [facility];  Service: Gastroenterology;  Laterality: [name];   ESOPHAGOGASTRODUODENOSCOPY (EGD) WITH PROPOFOL [name] 05/18/2021   Procedure: ESOPHAGOGASTRODUODENOSCOPY (EGD) WITH PROPOFOL;  Surgeon: Otis Brace, MD;  Location: [facility];  Service: Gastroenterology;  Laterality: [name];   HAND SURGERY         Family History  Problem Relation Age of Onset   Alcohol abuse Mother    Schizophrenia Father    Depression Father    Alcohol abuse Father    Alcohol abuse Paternal Uncle    Alcohol abuse Paternal Uncle     Social History   Tobacco Use   Smoking status: Every Day    Packs/day: 1.00    Types: Cigarettes   Smokeless tobacco: Never  Vaping Use   Vaping Use: Never used  Substance  Use Topics   Alcohol use: Yes    Alcohol/week: 14.0 standard drinks    Types: 14 Cans of beer per week    Comment: 5-6 40oz per day   Drug use: No    Home Medications Prior to Admission medications   Medication Sig Start Date End Date Taking? Authorizing Provider  albuterol (PROVENTIL HFA;VENTOLIN HFA) 108 (90 BASE) MCG/ACT inhaler Inhale 2 puffs into the lungs every 6 (six) hours as needed for wheezing or shortness of breath.    [provider]  alprazolam Duanne Moron) 2 MG tablet Take 2 mg by  mouth 3 (three) times daily.    [provider]  benzonatate (TESSALON) 100 MG capsule Take 200 mg by mouth every 8 (eight) hours as needed for cough. 10/23/19   [provider]  bictegravir-emtricitabine-tenofovir AF (BIKTARVY) 50-200-25 MG TABS tablet Take 1 tablet by mouth daily.  02/24/17   [provider]  diphenoxylate-atropine (LOMOTIL) 2.5-0.025 MG tablet Take 1 tablet by mouth every 8 (eight) hours as needed for diarrhea or loose stools. 02/16/21   [provider]  escitalopram (LEXAPRO) 20 MG tablet Take 1 tablet by mouth daily. 02/16/21   [provider]  folic acid (FOLVITE) 1 MG tablet Take 1 mg by mouth daily. 10/03/19   [provider]  furosemide (LASIX) 40 MG tablet Take 40 mg by mouth daily as needed for fluid. 02/27/20   [provider]  lurasidone 120 MG TABS Take 1 tablet (120 mg total) by mouth daily. 05/15/15   Niel Hummer, NP  magnesium oxide (MAG-OX) 400 (240 Mg) MG tablet Take 1 tablet (400 mg total) by mouth daily. 05/19/21   Geradine Girt, DO  ondansetron (ZOFRAN) 8 MG tablet Take 8 mg by mouth every 8 (eight) hours as needed for nausea/vomiting, nausea or vomiting. 10/03/19   [provider]  pantoprazole (PROTONIX) 40 MG tablet Take 1 tablet (40 mg total) by mouth daily. 05/20/21   Geradine Girt, DO  thiamine 100 MG tablet Take 2.5 tablets (250 mg total) by mouth daily. 05/20/21   Geradine Girt, DO  traZODone (DESYREL) 100 MG tablet Take 2 tablets (200 mg total) by mouth at bedtime as needed for sleep. 05/19/21   Geradine Girt, DO  valACYclovir (VALTREX) 1000 MG tablet Take 1 tablet (1,000 mg total) by mouth daily. 05/15/15   Niel Hummer, NP    Allergies    Dapsone and Primaquine phosphate  Review of Systems   Review of Systems  Constitutional:  Negative for appetite change and fatigue.  HENT:  Negative for congestion, ear discharge and sinus pressure.   Eyes:  Negative for discharge.   Respiratory:  Negative for cough.   Cardiovascular:  Negative for chest pain.  Gastrointestinal:  Negative for abdominal pain and diarrhea.  Genitourinary:  Negative for frequency and hematuria.  Musculoskeletal:  Negative for back pain.       Hip pain  Skin:  Negative for rash.  Neurological:  Negative for seizures and headaches.  Psychiatric/Behavioral:  Negative for hallucinations.    Physical Exam Updated Vital Signs BP (!) 89/75   Pulse 83   Temp (!) 92.3 F (33.5 C) (Rectal)   Resp 13   Ht 6' 3"  (1.905 m)   Wt 98 kg   SpO2 95%   BMI 27.00 kg/m   Physical Exam Vitals and nursing note reviewed.  Constitutional:      Appearance: He is well-developed.     Comments:  Lethargic  HENT:     Head: Normocephalic.     Nose: Nose normal.  Eyes:     General: No scleral icterus.    Conjunctiva/sclera: Conjunctivae normal.  Neck:     Thyroid: No thyromegaly.  Cardiovascular:     Rate and Rhythm: Normal rate and regular rhythm.     Heart sounds: No murmur heard.   No friction rub. No gallop.  Pulmonary:     Breath sounds: No stridor. No wheezing or rales.  Chest:     Chest wall: No tenderness.  Abdominal:     General: There is no distension.     Tenderness: There is no abdominal tenderness. There is no rebound.  Musculoskeletal:        General: Normal range of motion.     Cervical back: Neck supple.     Comments: Tender right hip  Lymphadenopathy:     Cervical: No cervical adenopathy.  Skin:    Findings: No erythema or rash.     Comments: Ulcers throughout backside  Neurological:     Mental Status: He is oriented to person, place, and time.     Motor: No abnormal muscle tone.     Coordination: Coordination normal.  Psychiatric:        Behavior: Behavior normal.    ED Results / Procedures / Treatments   Labs (all labs ordered are listed, but only abnormal results are displayed) Labs Reviewed  LACTIC ACID, PLASMA - Abnormal; Notable for the following  components:      Result Value   Lactic Acid, Venous 3.5 (*)    All other components within normal limits  COMPREHENSIVE METABOLIC PANEL - Abnormal; Notable for the following components:   Sodium 129 (*)    CO2 21 (*)    Glucose, Bld 122 (*)    Creatinine, Ser 1.26 (*)    Calcium 7.7 (*)    Albumin <1.5 (*)    AST 51 (*)    ALT 68 (*)    Alkaline Phosphatase 279 (*)    All other components within normal limits  CBC WITH DIFFERENTIAL/PLATELET - Abnormal; Notable for the following components:   WBC 15.0 (*)    RBC 1.99 (*)    Hemoglobin 6.7 (*)    HCT 20.8 (*)    MCV 104.5 (*)    RDW 18.9 (*)    Platelets 114 (*)    Neutro Abs 12.0 (*)    Abs Immature Granulocytes 0.14 (*)    All other components within normal limits  PROTIME-INR - Abnormal; Notable for the following components:   Prothrombin Time 17.8 (*)    INR 1.5 (*)    All other components within normal limits  RESP PANEL BY RT-PCR (FLU A&B, COVID) ARPGX2  CULTURE, BLOOD (ROUTINE X 2)  CULTURE, BLOOD (ROUTINE X 2)  URINE CULTURE  APTT  LACTIC ACID, PLASMA  URINALYSIS, ROUTINE W REFLEX MICROSCOPIC  ETHANOL  T-HELPER CELLS (CD4) COUNT (NOT AT [facility])  TYPE AND SCREEN  PREPARE RBC (CROSSMATCH)    EKG None  Radiology DG Pelvis Portable  Result Date: 08/23/2021 CLINICAL DATA:  Recent fall with hip pain, initial encounter EXAM: PORTABLE PELVIS 1-2 VIEWS COMPARISON:  05/10/2021 CT FINDINGS: There is again noted mild deformity and fragmentation involving the head of the left femur similar to that seen on prior CT examination consistent with sequelae from prior avascular necrosis. The pelvic ring is intact although the patient is significantly rotated. Proximal right femur appears within normal limits.  No other focal abnormality is noted. 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 No acute abnormality is noted. Electronically Signed   By:  Inez Catalina M.D.   On: 08/23/2021 20:38   DG Chest Port 1 View  Result Date: 08/23/2021 CLINICAL DATA:  Recent fall, possible sepsis, initial encounter EXAM: PORTABLE CHEST 1 VIEW COMPARISON:  05/07/2021 FINDINGS: Cardiac shadow is stable. Patient is rotated significantly to the left accentuating the mediastinal markings. The lungs appear clear but hypoinflated. No acute bony abnormality is noted. IMPRESSION: No acute abnormality noted. Electronically Signed   By: Inez Catalina M.D.   On: 08/23/2021 20:37    Procedures Procedures   Medications Ordered in ED Medications  sodium chloride 0.9 % bolus 1,000 mL (1,000 mLs Intravenous New Bag/Given 08/23/21 2009)    And  sodium chloride 0.9 % bolus 1,000 mL (has no administration in time range)    And  sodium chloride 0.9 % bolus 1,000 mL (has no administration in time range)  ceFEPIme (MAXIPIME) 2 g in sodium chloride 0.9 % 100 mL IVPB (has no administration in time range)  metroNIDAZOLE (FLAGYL) IVPB 500 mg (has no administration in time range)  vancomycin (VANCOREADY) IVPB 2000 mg/400 mL (2,000 mg Intravenous New Bag/Given 08/23/21 2047)  0.9 %  sodium chloride infusion (has no administration in time range)    ED Course  I have reviewed the triage vital signs and the nursing notes.  Pertinent labs & imaging results that were available during my care of the patient were reviewed by me and considered in my medical decision making (see chart for details).   CRITICAL CARE Performed by: Milton Ferguson Total critical care time: 40 minutes Critical care time was exclusive of separately billable procedures and treating other patients. Critical care was necessary to treat or prevent imminent or life-threatening deterioration. Critical care was time spent personally by me on the following activities: development of treatment plan with patient and/or surrogate as well as nursing, discussions with consultants, evaluation of patient's response to  treatment, examination of patient, obtaining history from patient or surrogate, ordering and performing treatments and interventions, ordering and review of laboratory studies, ordering and review of radiographic studies, pulse oximetry and re-evaluation of patient's condition.  MDM Rules/Calculators/A&P                           Patient is septic and will be admitted to critical care. Final Clinical Impression(s) / ED Diagnoses Final diagnoses:  None    Rx / DC Orders ED Discharge Orders     None        Milton Ferguson, MD 08/23/21 2157

## 2021-08-23 NOTE — ED Notes (Signed)
Pt placed on Bair Hugger due to hypothermia

## 2021-08-23 NOTE — Progress Notes (Signed)
A consult was received from an ED physician for Vancomycin and Cefepime per pharmacy dosing.  The patient's profile has been reviewed for ht/wt/allergies/indication/available labs.    A one time order has been placed for Vancomycin 2gm and Cefepime 2gm.    Further antibiotics/pharmacy consults should be ordered by admitting physician if indicated.                       Thank you, Everette Rank, PharmD 08/23/2021  8:01 PM

## 2021-08-23 NOTE — ED Notes (Signed)
Patients mother called and would like an update: [name] [phone]

## 2021-08-23 NOTE — ED Notes (Signed)
Pt placed on O2 2L via [place] for oxygen saturation of 80% while asleep

## 2021-08-23 NOTE — ED Notes (Signed)
Patient transported to CT 

## 2021-08-24 ENCOUNTER — Other Ambulatory Visit: Payer: Self-pay

## 2021-08-24 ENCOUNTER — Inpatient Hospital Stay: Payer: Self-pay

## 2021-08-24 DIAGNOSIS — T68XXXA Hypothermia, initial encounter: Secondary | ICD-10-CM

## 2021-08-24 DIAGNOSIS — A419 Sepsis, unspecified organism: Secondary | ICD-10-CM | POA: Diagnosis not present

## 2021-08-24 DIAGNOSIS — R0902 Hypoxemia: Secondary | ICD-10-CM

## 2021-08-24 DIAGNOSIS — R6520 Severe sepsis without septic shock: Secondary | ICD-10-CM

## 2021-08-24 HISTORY — DX: Hypothermia, initial encounter: T68.XXXA

## 2021-08-24 LAB — CBC
HCT: 24.1 % — ABNORMAL LOW (ref 39.0–52.0)
HCT: 22.6 % — ABNORMAL LOW (ref 39.0–52.0)
Hemoglobin: 8.1 g/dL — ABNORMAL LOW (ref 13.0–17.0)
Hemoglobin: 7.4 g/dL — ABNORMAL LOW (ref 13.0–17.0)
MCH: 32.9 pg (ref 26.0–34.0)
MCH: 32.9 pg (ref 26.0–34.0)
MCHC: 33.6 g/dL (ref 30.0–36.0)
MCHC: 32.7 g/dL (ref 30.0–36.0)
MCV: 98 fL (ref 80.0–100.0)
MCV: 100.4 fL — ABNORMAL HIGH (ref 80.0–100.0)
Platelets: 112 10*3/uL — ABNORMAL LOW (ref 150–400)
Platelets: 98 10*3/uL — ABNORMAL LOW (ref 150–400)
RBC: 2.46 MIL/uL — ABNORMAL LOW (ref 4.22–5.81)
RBC: 2.25 MIL/uL — ABNORMAL LOW (ref 4.22–5.81)
RDW: 19.6 % — ABNORMAL HIGH (ref 11.5–15.5)
RDW: 20.6 % — ABNORMAL HIGH (ref 11.5–15.5)
WBC: 15.6 10*3/uL — ABNORMAL HIGH (ref 4.0–10.5)
WBC: 15.5 10*3/uL — ABNORMAL HIGH (ref 4.0–10.5)
nRBC: 0 % (ref 0.0–0.2)
nRBC: 0 % (ref 0.0–0.2)

## 2021-08-24 LAB — PROCALCITONIN: Procalcitonin: 6.15 ng/mL

## 2021-08-24 LAB — COMPREHENSIVE METABOLIC PANEL
ALT: 58 U/L — ABNORMAL HIGH (ref 0–44)
ALT: 54 U/L — ABNORMAL HIGH (ref 0–44)
AST: 43 U/L — ABNORMAL HIGH (ref 15–41)
AST: 48 U/L — ABNORMAL HIGH (ref 15–41)
Albumin: <1.5 g/dL — ABNORMAL LOW (ref 3.5–5.0)
Albumin: <1.5 g/dL — ABNORMAL LOW (ref 3.5–5.0)
Alkaline Phosphatase: 270 U/L — ABNORMAL HIGH (ref 38–126)
Alkaline Phosphatase: 233 U/L — ABNORMAL HIGH (ref 38–126)
Anion gap: 5 (ref 5–15)
Anion gap: 4 — ABNORMAL LOW (ref 5–15)
BUN: 8 mg/dL (ref 6–20)
BUN: 9 mg/dL (ref 6–20)
CO2: 22 mmol/L (ref 22–32)
CO2: 21 mmol/L — ABNORMAL LOW (ref 22–32)
Calcium: 7.1 mg/dL — ABNORMAL LOW (ref 8.9–10.3)
Calcium: 7 mg/dL — ABNORMAL LOW (ref 8.9–10.3)
Chloride: 107 mmol/L (ref 98–111)
Chloride: 111 mmol/L (ref 98–111)
Creatinine, Ser: 1.02 mg/dL (ref 0.61–1.24)
Creatinine, Ser: 1.2 mg/dL (ref 0.61–1.24)
GFR, Estimated: >60 mL/min (ref >60)
GFR, Estimated: >60 mL/min (ref >60)
Glucose, Bld: 98 mg/dL (ref 70–99)
Glucose, Bld: 102 mg/dL — ABNORMAL HIGH (ref 70–99)
Potassium: 3.8 mmol/L (ref 3.5–5.1)
Potassium: 4.2 mmol/L (ref 3.5–5.1)
Sodium: 134 mmol/L — ABNORMAL LOW (ref 135–145)
Sodium: 136 mmol/L (ref 135–145)
Total Bilirubin: 1.8 mg/dL — ABNORMAL HIGH (ref 0.3–1.2)
Total Bilirubin: 1.4 mg/dL — ABNORMAL HIGH (ref 0.3–1.2)
Total Protein: 6 g/dL — ABNORMAL LOW (ref 6.5–8.1)
Total Protein: 5.7 g/dL — ABNORMAL LOW (ref 6.5–8.1)

## 2021-08-24 LAB — IRON AND TIBC: Iron: 63 ug/dL (ref 45–182)

## 2021-08-24 LAB — RETICULOCYTES
Immature Retic Fract: 26.3 % — ABNORMAL HIGH (ref 2.3–15.9)
RBC.: 2.46 MIL/uL — ABNORMAL LOW (ref 4.22–5.81)
Retic Count, Absolute: 80 10*3/uL (ref 19.0–186.0)
Retic Ct Pct: 3.3 % — ABNORMAL HIGH (ref 0.4–3.1)

## 2021-08-24 LAB — TSH: TSH: 2.303 u[IU]/mL (ref 0.350–4.500)

## 2021-08-24 LAB — MAGNESIUM: Magnesium: 1.9 mg/dL (ref 1.7–2.4)

## 2021-08-24 LAB — PREPARE RBC (CROSSMATCH)

## 2021-08-24 LAB — T-HELPER CELLS (CD4) COUNT (NOT AT ARMC)
CD4 % Helper T Cell: 66 % — ABNORMAL HIGH (ref 33–65)
CD4 T Cell Abs: 1169 /uL (ref 400–1790)

## 2021-08-24 LAB — FOLATE: Folate: 5.9 ng/mL — ABNORMAL LOW (ref >5.9)

## 2021-08-24 LAB — ETHANOL: Alcohol, Ethyl (B): <10 mg/dL (ref <10)

## 2021-08-24 LAB — HEMOGLOBIN AND HEMATOCRIT, BLOOD
HCT: 19.9 % — ABNORMAL LOW (ref 39.0–52.0)
Hemoglobin: 6.3 g/dL — CL (ref 13.0–17.0)

## 2021-08-24 LAB — MRSA NEXT GEN BY PCR, NASAL: MRSA by PCR Next Gen: DETECTED — AB

## 2021-08-24 LAB — HEPATITIS PANEL, ACUTE
HCV Ab: NONREACTIVE
Hep A IgM: NONREACTIVE
Hep B C IgM: NONREACTIVE
Hepatitis B Surface Ag: NONREACTIVE

## 2021-08-24 LAB — CORTISOL-AM, BLOOD: Cortisol - AM: 13.8 ug/dL (ref 6.7–22.6)

## 2021-08-24 LAB — VITAMIN B12: Vitamin B-12: 3689 pg/mL — ABNORMAL HIGH (ref 180–914)

## 2021-08-24 LAB — AMMONIA: Ammonia: 94 umol/L — ABNORMAL HIGH (ref 9–35)

## 2021-08-24 LAB — FERRITIN: Ferritin: 714 ng/mL — ABNORMAL HIGH (ref 24–336)

## 2021-08-24 MED ORDER — SODIUM CHLORIDE 0.9% FLUSH: 10.0000 mL | INTRAVENOUS | Status: DC | PRN

## 2021-08-24 MED ORDER — SCOPOLAMINE 1 MG/3DAYS TD PT72: 1.0000 | MEDICATED_PATCH | TRANSDERMAL | Status: DC

## 2021-08-24 MED ORDER — NOREPINEPHRINE 4 MG/250ML-% IV SOLN: 2.0000 ug/min | INTRAVENOUS | Status: DC

## 2021-08-24 MED ORDER — SCOPOLAMINE 1 MG/3DAYS TD PT72
1.0000 | MEDICATED_PATCH | Freq: Once | TRANSDERMAL | Status: AC | PRN
  Administered 2021-08-24: 1.5 mg via TRANSDERMAL
  Filled 2021-08-24: qty 1

## 2021-08-24 MED ORDER — POTASSIUM CHLORIDE CRYS ER 20 MEQ PO TBCR
40.0000 meq | EXTENDED_RELEASE_TABLET | Freq: Once | ORAL | Status: DC
  Filled 2021-08-24: qty 2

## 2021-08-24 MED ORDER — FUROSEMIDE 40 MG PO TABS
40.0000 mg | ORAL_TABLET | Freq: Every day | ORAL | Status: DC
  Administered 2021-08-24: 40 mg via ORAL
  Filled 2021-08-24: qty 1

## 2021-08-24 MED ORDER — NOREPINEPHRINE 4 MG/250ML-% IV SOLN
INTRAVENOUS | Status: AC
  Administered 2021-08-24: 2 ug/min via INTRAVENOUS
  Filled 2021-08-24: qty 250

## 2021-08-24 MED ORDER — PNEUMOCOCCAL VAC POLYVALENT 25 MCG/0.5ML IJ INJ
0.5000 mL | INJECTION | INTRAMUSCULAR | Status: AC
  Administered 2021-08-25: 0.5 mL via INTRAMUSCULAR
  Filled 2021-08-24: qty 0.5

## 2021-08-24 MED ORDER — PROSOURCE PLUS PO LIQD
30.0000 mL | Freq: Four times a day (QID) | ORAL | Status: DC
  Administered 2021-08-25 – 2021-08-31 (×14): 30 mL via ORAL
  Filled 2021-08-24 (×19): qty 30

## 2021-08-24 MED ORDER — MUPIROCIN 2 % EX OINT
1.0000 "application " | TOPICAL_OINTMENT | Freq: Two times a day (BID) | CUTANEOUS | Status: AC
  Administered 2021-08-24 – 2021-08-28 (×10): 1 via NASAL
  Filled 2021-08-24 (×2): qty 22

## 2021-08-24 MED ORDER — NOREPINEPHRINE 4 MG/250ML-% IV SOLN: 0.0000 ug/min | INTRAVENOUS | Status: DC

## 2021-08-24 MED ORDER — VALACYCLOVIR HCL 500 MG PO TABS
1000.0000 mg | ORAL_TABLET | Freq: Every day | ORAL | Status: DC
  Administered 2021-08-24 – 2021-08-31 (×8): 1000 mg via ORAL
  Filled 2021-08-24 (×8): qty 2

## 2021-08-24 MED ORDER — LACTATED RINGERS IV BOLUS: 1000.0000 mL | Freq: Once | INTRAVENOUS | Status: DC

## 2021-08-24 MED ORDER — CHLORHEXIDINE GLUCONATE CLOTH 2 % EX PADS
6.0000 | MEDICATED_PAD | Freq: Every day | CUTANEOUS | Status: DC
  Administered 2021-08-25 (×2): 6 via TOPICAL

## 2021-08-24 MED ORDER — NICOTINE 14 MG/24HR TD PT24
14.0000 mg | MEDICATED_PATCH | Freq: Every day | TRANSDERMAL | Status: DC
Start: 2021-08-24 — End: 2021-09-01
  Administered 2021-08-24 – 2021-08-31 (×8): 14 mg via TRANSDERMAL
  Filled 2021-08-24 (×8): qty 1

## 2021-08-24 MED ORDER — OXYCODONE HCL 5 MG PO TABS
5.0000 mg | ORAL_TABLET | Freq: Once | ORAL | Status: AC | PRN
  Administered 2021-08-24: 5 mg via ORAL
  Filled 2021-08-24: qty 1

## 2021-08-24 MED ORDER — ALBUMIN HUMAN 25 % IV SOLN
50.0000 g | Freq: Once | INTRAVENOUS | Status: AC
  Administered 2021-08-24: 50 g via INTRAVENOUS
  Filled 2021-08-24: qty 200

## 2021-08-24 MED ORDER — SODIUM CHLORIDE 0.9 % IV SOLN: INTRAVENOUS | Status: AC

## 2021-08-24 MED ORDER — SODIUM CHLORIDE 0.9% IV SOLUTION: Freq: Once | INTRAVENOUS | Status: AC

## 2021-08-24 MED ORDER — OXYCODONE HCL 5 MG PO TABS
5.0000 mg | ORAL_TABLET | Freq: Four times a day (QID) | ORAL | Status: DC | PRN
  Administered 2021-08-25 – 2021-08-31 (×11): 5 mg via ORAL
  Filled 2021-08-24 (×12): qty 1

## 2021-08-24 MED ORDER — LACTULOSE 10 GM/15ML PO SOLN
30.0000 g | Freq: Once | ORAL | Status: DC
  Filled 2021-08-24: qty 45

## 2021-08-24 MED ORDER — CHLORHEXIDINE GLUCONATE CLOTH 2 % EX PADS
6.0000 | MEDICATED_PAD | Freq: Every day | CUTANEOUS | Status: AC
  Administered 2021-08-24 – 2021-08-27 (×4): 6 via TOPICAL

## 2021-08-24 MED ORDER — JUVEN PO PACK
1.0000 | PACK | Freq: Two times a day (BID) | ORAL | Status: DC
  Administered 2021-08-24 – 2021-08-25 (×2): 1 via ORAL
  Filled 2021-08-24 (×6): qty 1

## 2021-08-24 MED ORDER — THIAMINE HCL 100 MG/ML IJ SOLN
100.0000 mg | Freq: Every day | INTRAMUSCULAR | Status: DC
  Administered 2021-08-26 – 2021-08-28 (×2): 100 mg via INTRAVENOUS
  Filled 2021-08-24 (×5): qty 2

## 2021-08-24 MED ORDER — PANTOPRAZOLE SODIUM 40 MG IV SOLR
40.0000 mg | Freq: Two times a day (BID) | INTRAVENOUS | Status: DC
  Administered 2021-08-24 – 2021-08-27 (×8): 40 mg via INTRAVENOUS
  Filled 2021-08-24 (×8): qty 40

## 2021-08-24 MED ORDER — THIAMINE HCL 100 MG PO TABS
100.0000 mg | ORAL_TABLET | Freq: Every day | ORAL | Status: DC
  Administered 2021-08-24 – 2021-08-31 (×6): 100 mg via ORAL
  Filled 2021-08-24 (×7): qty 1

## 2021-08-24 MED ORDER — BICTEGRAVIR-EMTRICITAB-TENOFOV 50-200-25 MG PO TABS
1.0000 | ORAL_TABLET | Freq: Every day | ORAL | Status: DC
  Administered 2021-08-24 – 2021-08-31 (×8): 1 via ORAL
  Filled 2021-08-24 (×8): qty 1

## 2021-08-24 MED ORDER — SODIUM CHLORIDE 0.9% FLUSH
10.0000 mL | Freq: Two times a day (BID) | INTRAVENOUS | Status: DC
  Administered 2021-08-24 – 2021-08-25 (×3): 10 mL
  Administered 2021-08-26: 12:00:00 20 mL
  Administered 2021-08-26 – 2021-08-31 (×6): 10 mL

## 2021-08-24 MED ORDER — SODIUM CHLORIDE 0.9 % IV BOLUS
1000.0000 mL | Freq: Once | INTRAVENOUS | Status: AC
  Administered 2021-08-24: 1000 mL via INTRAVENOUS

## 2021-08-24 MED ORDER — LORAZEPAM 2 MG/ML IJ SOLN: 1.0000 mg | INTRAMUSCULAR | Status: AC | PRN

## 2021-08-24 MED ORDER — LACTULOSE ENEMA
300.0000 mL | Freq: Once | ORAL | Status: DC
  Filled 2021-08-24 (×2): qty 300

## 2021-08-24 MED ORDER — METRONIDAZOLE 500 MG/100ML IV SOLN
500.0000 mg | Freq: Two times a day (BID) | INTRAVENOUS | Status: DC
  Administered 2021-08-24 – 2021-08-26 (×5): 500 mg via INTRAVENOUS
  Filled 2021-08-24 (×5): qty 100

## 2021-08-24 MED ORDER — SODIUM CHLORIDE 0.9 % IV SOLN
2.0000 g | Freq: Three times a day (TID) | INTRAVENOUS | Status: DC
  Administered 2021-08-24 (×2): 2 g via INTRAVENOUS
  Filled 2021-08-24 (×2): qty 2

## 2021-08-24 MED ORDER — VANCOMYCIN HCL 1500 MG/300ML IV SOLN
1500.0000 mg | Freq: Two times a day (BID) | INTRAVENOUS | Status: DC
  Administered 2021-08-24 – 2021-08-25 (×3): 1500 mg via INTRAVENOUS
  Filled 2021-08-24 (×4): qty 300

## 2021-08-24 MED ORDER — ADULT MULTIVITAMIN W/MINERALS CH
1.0000 | ORAL_TABLET | Freq: Every day | ORAL | Status: DC
  Administered 2021-08-24 – 2021-08-31 (×8): 1 via ORAL
  Filled 2021-08-24 (×8): qty 1

## 2021-08-24 MED ORDER — NOREPINEPHRINE 4 MG/250ML-% IV SOLN
0.0000 ug/min | INTRAVENOUS | Status: DC
  Administered 2021-08-24: 12 ug/min via INTRAVENOUS
  Administered 2021-08-25: 2 ug/min via INTRAVENOUS
  Administered 2021-08-25: 14:00:00 9 ug/min via INTRAVENOUS
  Administered 2021-08-25: 10 ug/min via INTRAVENOUS
  Administered 2021-08-25: 9 ug/min via INTRAVENOUS
  Administered 2021-08-26: 20:00:00 5 ug/min via INTRAVENOUS
  Administered 2021-08-27: 2 ug/min via INTRAVENOUS
  Filled 2021-08-24 (×6): qty 250

## 2021-08-24 MED ORDER — LORAZEPAM 1 MG PO TABS: 1.0000 mg | ORAL_TABLET | ORAL | Status: AC | PRN

## 2021-08-24 MED ORDER — SODIUM CHLORIDE 0.9 % IV SOLN
250.0000 mL | INTRAVENOUS | Status: DC
  Administered 2021-08-24: 250 mL via INTRAVENOUS

## 2021-08-24 MED ORDER — CEFTRIAXONE SODIUM 2 G IJ SOLR
2.0000 g | INTRAMUSCULAR | Status: DC
  Administered 2021-08-24 – 2021-08-25 (×2): 2 g via INTRAVENOUS
  Filled 2021-08-24 (×2): qty 20

## 2021-08-24 MED ORDER — ENOXAPARIN SODIUM 40 MG/0.4ML IJ SOSY
40.0000 mg | PREFILLED_SYRINGE | INTRAMUSCULAR | Status: DC
  Administered 2021-08-24 – 2021-08-30 (×7): 40 mg via SUBCUTANEOUS
  Filled 2021-08-24 (×6): qty 0.4

## 2021-08-24 MED ORDER — SODIUM CHLORIDE 0.9 % IV SOLN: INTRAVENOUS | Status: DC

## 2021-08-24 MED ORDER — FOLIC ACID 1 MG PO TABS
1.0000 mg | ORAL_TABLET | Freq: Every day | ORAL | Status: DC
  Administered 2021-08-24 – 2021-08-31 (×8): 1 mg via ORAL
  Filled 2021-08-24 (×8): qty 1

## 2021-08-24 NOTE — Consult Note (Signed)
NAME:  [name], MRN:  [id number], DOB:  March 18, 1986, LOS: 1 ADMISSION DATE:  08/23/2021, CONSULTATION DATE: 11 chest 15/22 REFERRING MD: TRH, CHIEF COMPLAINT: Encephalopathy  History of Present Illness:  35 year old man admitted after fall now concern for septic shock.  Presumed source skin soft tissue given chronic cellulitis/buttock wounds.  Transfer to ICU for persistent hypotension.  Encephalopathic.  Blood pressures have been elevated place but low in the past.  On broad-spectrum biotics.  Review of records shows recent TP-PA positive, intermittent RPR low titer positive.  Neurosyphilis?[name]  Most likely sources lower extremity wounds/chronic cellulitis changes.  Try to place NG tube.  Reportedly tried to hit the nurse.  This was put on hold.  Pertinent  Medical History  Well-controlled HIV  Significant Hospital Events: Including procedures, antibiotic start and stop dates in addition to other pertinent events   11/14 admitted after fall, developed hypotension ongoing issues with buttocks wounds well described [facility]15 persistent hypotension for many hours, eventually transferred to the ICU started on low-dose peripheral vasopressors  Interim History / Subjective:    Objective   Blood pressure (!) 64/43, pulse 93, temperature (!) 94.9 F (34.9 C), temperature source Rectal, resp. rate 16, height 6' 3"  (1.905 m), weight 98 kg, SpO2 100 %.        Intake/Output Summary (Last 24 hours) at 08/24/2021 1831 Last data filed at 08/24/2021 1225 Gross per 24 hour  Intake 6609.53 ml  Output 800 ml  Net 5809.53 ml   Filed Weights   08/23/21 1923  Weight: 98 kg    Examination: General: Chronic ill-appearing, no acute distress Eyes: EOMI, icterus Neck: Supple, no JVP, range of motion intact Pulmonary: Clear, normal work of breathing Cardiovascular: Tachycardic, regular rhythm Abdomen: Nondistended, bowel sounds present MSK: No synovitis, joint  effusion Neuro: Easily arousable, nods off, drowsy, mild* Psych: Difficult to interpret given encephalopathy  Resolved Hospital Problem list     Assessment & Plan:  Septic shock: Presumed, elevated lactate on admission.  Likely component of hypovolemia given poor p.o. intake.  Lactate improved with IV fluids initially.  Presumed source lower extremity wounds. --Continue broad-spectrum antibiotics --Norepinephrine, goal blood pressure systolic 90 or MAP of 60 --1 L crystalloid, 50 g albumin  Toxic metabolic encephalopathy: Concern for severe sepsis as primary driver.  Possible hepatic encephalopathy, history of alcohol abuse, elevated LFTs.  No formal diagnosis of cirrhosis.  Mild asterixis on exam.  Unable to take lactulose.  Swung at nurse when trying to place NG tube.   --Change antibiotics to stop cefepime, start ceftriaxone given possible concern for neurosyphilis, perhaps I am overstating the concern see below --Consider another attempted NG placement for lactulose  HIV: Well-controlled, CD4 count 1100 this admission.  Prior viral load 05/2021 undetectable.  Not checked this admission but given robust CD4 count suspect still undetectable. --ID consult in the morning, difficult to interpret recent RPR + with prior negative, tPA-PA recent positive with prior negative.    Severe protein calorie malnutrition: Present on admission.  Consider tube feeds if unable to take p.o.  Failure to thrive: Unclear trigger, temporal wasting.  Best Practice (right click and "Reselect all SmartList Selections" daily)   Diet/type: NPO DVT prophylaxis: LMWH GI prophylaxis: PPI Lines: [name] Foley:  [name] Code Status:  full code Last date of multidisciplinary goals of care discussion [n/a]  Labs   CBC: Recent Labs  Lab 08/23/21 1956 08/24/21 0415  WBC 15.0* 15.6*  NEUTROABS 12.0*  --  HGB 6.7* 8.1*  HCT 20.8* 24.1*  MCV 104.5* 98.0  PLT 114* 112*    Basic Metabolic Panel: Recent Labs  Lab  08/23/21 1956 08/24/21 0415  NA 129* 134*  K 3.7 3.8  CL 101 107  CO2 21* 22  GLUCOSE 122* 98  BUN 8 8  CREATININE 1.26* 1.02  CALCIUM 7.7* 7.1*  MG  --  1.9   GFR: Estimated Creatinine Clearance: 118.5 mL/min (by C-G formula based on SCr of 1.02 mg/dL). Recent Labs  Lab 08/23/21 1956 08/23/21 2214 08/24/21 0415  PROCALCITON  --   --  6.15  WBC 15.0*  --  15.6*  LATICACIDVEN 3.5* 1.9  --     Liver Function Tests: Recent Labs  Lab 08/23/21 1956 08/24/21 0415  AST 51* 43*  ALT 68* 58*  ALKPHOS 279* 270*  BILITOT 0.9 1.8*  PROT 6.6 6.0*  ALBUMIN <1.5* <1.5*   No results for input(s): LIPASE, AMYLASE in the last 168 hours. Recent Labs  Lab 08/24/21 0415  AMMONIA 94*    ABG    Component Value Date/Time   PHART 7.339 (L) 10/28/2019 0916   PCO2ART 48.2 (H) 10/28/2019 0916   PO2ART 81.0 (L) 10/28/2019 0916   HCO3 26.0 10/28/2019 0916   TCO2 27 10/28/2019 0916   ACIDBASEDEF 2.0 10/28/2019 0550   O2SAT 95.0 10/28/2019 0916     Coagulation Profile: Recent Labs  Lab 08/23/21 1956  INR 1.5*    Cardiac Enzymes: No results for input(s): CKTOTAL, CKMB, CKMBINDEX, TROPONINI in the last 168 hours.  HbA1C: Hgb A1c MFr Bld  Date/Time Value Ref Range Status  10/28/2019 06:10 PM 6.1 (H) 4.8 - 5.6 % Final    Comment:    (NOTE) Pre diabetes:          5.7%-6.4% Diabetes:              >6.4% Glycemic control for   <7.0% adults with diabetes     CBG: No results for input(s): GLUCAP in the last 168 hours.  Review of Systems:   Unobtainable due to patient factors  Past Medical History:  He,  has a past medical history of Bipolar 1 disorder ([place]), Depression, Dizziness and giddiness (02/01/2016), Herpes genitalia, HIV disease ([place]), Hypertension, Migraine headache (02/01/2016), Peripheral neuropathy (10/01/2019), PTSD (post-traumatic stress disorder), Schizoaffective disorder ([place]), and Seizures ([place]).   Surgical History:   Past Surgical History:  Procedure  Laterality Date   BACK SURGERY     BIOPSY  02/26/2021   Procedure: BIOPSY;  Surgeon: Otis Brace, MD;  Location: [facility];  Service: Gastroenterology;;   COLONOSCOPY WITH PROPOFOL [name] 02/26/2021   Procedure: COLONOSCOPY WITH PROPOFOL;  Surgeon: Otis Brace, MD;  Location: [facility];  Service: Gastroenterology;  Laterality: [name];   ESOPHAGOGASTRODUODENOSCOPY (EGD) WITH PROPOFOL [name] 02/26/2021   Procedure: ESOPHAGOGASTRODUODENOSCOPY (EGD) WITH PROPOFOL;  Surgeon: Otis Brace, MD;  Location: [facility];  Service: Gastroenterology;  Laterality: [name];   ESOPHAGOGASTRODUODENOSCOPY (EGD) WITH PROPOFOL [name] 05/18/2021   Procedure: ESOPHAGOGASTRODUODENOSCOPY (EGD) WITH PROPOFOL;  Surgeon: Otis Brace, MD;  Location: [facility];  Service: Gastroenterology;  Laterality: [name];   HAND SURGERY       Social History:   reports that he has been smoking cigarettes. He has been smoking an average of 1 pack per day. He has never used smokeless tobacco. He reports current alcohol use of about 14.0 standard drinks per week. He reports that he does not use drugs.   Family History:  His family history includes Alcohol  abuse in his father, mother, paternal uncle, and paternal uncle; Depression in his father; Schizophrenia in his father.   Allergies Allergies  Allergen Reactions   Dapsone Other (See Comments)    Per centricity "G6PD deficient"   Primaquine Phosphate Other (See Comments)    Per Centricity "G6PD deficient"     Home Medications  Prior to Admission medications   Medication Sig Start Date End Date Taking? Authorizing Provider  albuterol (PROVENTIL HFA;VENTOLIN HFA) 108 (90 BASE) MCG/ACT inhaler Inhale 2 puffs into the lungs every 6 (six) hours as needed for wheezing or shortness of breath.   Yes [provider]  alprazolam Duanne Moron) 2 MG tablet Take 2 mg by mouth 3 (three) times daily.   Yes [provider]  benzonatate (TESSALON) 100 MG capsule Take 200  mg by mouth every 8 (eight) hours as needed for cough. 10/23/19  Yes [provider]  bictegravir-emtricitabine-tenofovir AF (BIKTARVY) 50-200-25 MG TABS tablet Take 1 tablet by mouth daily.  02/24/17  Yes [provider]  cyclobenzaprine (FLEXERIL) 10 MG tablet Take 10 mg by mouth 3 (three) times daily as needed for muscle spasms. 08/19/21  Yes [provider]  diphenoxylate-atropine (LOMOTIL) 2.5-0.025 MG tablet Take 1 tablet by mouth every 8 (eight) hours as needed for diarrhea or loose stools. 02/16/21  Yes [provider]  escitalopram (LEXAPRO) 20 MG tablet Take 1 tablet by mouth daily. 02/16/21  Yes [provider]  famotidine (PEPCID) 40 MG tablet Take 40 mg by mouth every morning. 08/19/21  Yes [provider]  folic acid (FOLVITE) 1 MG tablet Take 1 mg by mouth daily. 10/03/19  Yes [provider]  furosemide (LASIX) 40 MG tablet Take 40 mg by mouth daily as needed for fluid. 02/27/20  Yes [provider]  HYDROcodone-acetaminophen (NORCO/VICODIN) 5-325 MG tablet Take 1 tablet by mouth 2 (two) times daily as needed. 08/09/21  Yes [provider]  lurasidone 120 MG TABS Take 1 tablet (120 mg total) by mouth daily. 05/15/15  Yes Niel Hummer, NP  magnesium oxide (MAG-OX) 400 (240 Mg) MG tablet Take 1 tablet (400 mg total) by mouth daily. 05/19/21  Yes Vann, Jessica U, DO  ondansetron (ZOFRAN) 8 MG tablet Take 8 mg by mouth every 8 (eight) hours as needed for nausea/vomiting, nausea or vomiting. 10/03/19  Yes [provider]  pantoprazole (PROTONIX) 40 MG tablet Take 1 tablet (40 mg total) by mouth daily. 05/20/21  Yes Eulogio Bear U, DO  thiamine 100 MG tablet Take 2.5 tablets (250 mg total) by mouth daily. 05/20/21  Yes Geradine Girt, DO  traZODone (DESYREL) 100 MG tablet Take 2 tablets (200 mg total) by mouth at bedtime as needed for sleep. 05/19/21  Yes Geradine Girt, DO  valACYclovir (VALTREX) 1000 MG  tablet Take 1 tablet (1,000 mg total) by mouth daily. 05/15/15  Yes Niel Hummer, NP     Critical care time:     CRITICAL CARE Performed by: Lanier Clam   Total critical care time: 45 minutes  Critical care time was exclusive of separately billable procedures and treating other patients.  Critical care was necessary to treat or prevent imminent or life-threatening deterioration.  Critical care was time spent personally by me on the following activities: development of treatment plan with patient and/or surrogate as well as nursing, discussions with consultants, evaluation of patient's response to treatment, examination of patient, obtaining history from patient or surrogate, ordering and performing treatments and interventions,  ordering and review of laboratory studies, ordering and review of radiographic studies, pulse oximetry and re-evaluation of patient's condition.

## 2021-08-24 NOTE — Progress Notes (Signed)
Pt currently has PIV sites per RN

## 2021-08-24 NOTE — TOC Initial Note (Addendum)
Transition of Care [facility]) - Initial/Assessment Note    Patient Details  Name: [name] MRN: [id number] Date of Birth: 02-Apr-1986  Transition of [facility]) CM/SW Contact:    Dessa Phi, RN Phone Number: 08/24/2021, 3:55 PM  Clinical Narrative: Spoke to patient's mother listed as contact-if can d/c home would be best palan-lives alone @ home,has private aide, has multiple wounds buttock/thigh/legs-WOC following. PTAR @ d/c. Monitor for [facility] services-will check if agency able to assist. Referral for SA-tobacco,etoh-unable to talk to patient too lethargic. Will check with [facility] agency may have used in past-reviewing notes.                 Expected Discharge Plan: [place] Barriers to Discharge: Continued Medical Work up   Patient Goals and CMS Choice Patient states their goals for this hospitalization and ongoing recovery are:: go home CMS Medicare.gov Compare Post Acute Care list provided to:: Patient Represenative (must comment) Choice offered to / list presented to : Parent  Expected Discharge Plan and Services Expected Discharge Plan: [place]   Discharge Planning Services: CM Consult Post Acute Care Choice: Resumption of Svcs/PTA Provider (private duty aide) Living arrangements for the past 2 months: Apartment                                      Prior Living Arrangements/Services Living arrangements for the past 2 months: Apartment Lives with:: Self Patient language and need for interpreter reviewed:: Yes Do you feel safe going back to the place where you live?: Yes      Need for Family Participation in Patient Care: No (Comment) Care giver support system in place?: Yes (comment) Current home services: Homehealth aide, DME (rw) Criminal Activity/Legal Involvement Pertinent to Current Situation/Hospitalization: No - Comment as needed  Activities of Daily Living Home Assistive Devices/Equipment: Walker  (specify type) ADL Screening (condition at time of admission) Patient's cognitive ability adequate to safely complete daily activities?: Yes Is the patient deaf or have difficulty hearing?: No Does the patient have difficulty seeing, even when wearing glasses/contacts?: No Does the patient have difficulty concentrating, remembering, or making decisions?: No Patient able to express need for assistance with ADLs?: Yes Does the patient have difficulty dressing or bathing?: Yes Independently performs ADLs?: No Communication: Independent Dressing (OT): Needs assistance Is this a change from baseline?: Pre-admission baseline Grooming: Needs assistance Is this a change from baseline?: Pre-admission baseline Feeding: Independent with device (comment) Bathing: Needs assistance Is this a change from baseline?: Pre-admission baseline Toileting: Independent with device (comment) In/Out Bed: Needs assistance Is this a change from baseline?: Pre-admission baseline Walks in Home: Needs assistance Is this a change from baseline?: Pre-admission baseline Does the patient have difficulty walking or climbing stairs?: Yes Weakness of Legs: Both Weakness of Arms/Hands: Both  Permission Sought/Granted Permission sought to share information with : Case Manager Permission granted to share information with : Yes, Verbal Permission Granted  Share Information with NAME: Case Manager     Permission granted to share info w Relationship: Luellen Pucker mother [phone]     Emotional Assessment Appearance:: Appears stated age Attitude/Demeanor/Rapport: Lethargic Affect (typically observed): Unable to Assess Orientation: : Oriented to Self Alcohol / Substance Use: Alcohol Use, Tobacco Use    Admission diagnosis:  Severe sepsis ([place]) [A41.9, R65.20] Acute sepsis [facility]) [A41.9] Patient Active Problem List   Diagnosis Date  Noted   Hypothermia 08/24/2021   Hypoxia 08/24/2021   Severe sepsis ([place]) 08/23/2021    Left hip pain    Transaminitis    Acute metabolic encephalopathy 58/03/3867   Cellulitis 05/07/2021   Lymphadenopathy 09/29/2020   Anemia    Upper urinary tract infection    Sepsis secondary to UTI ([place]) 03/14/2020   Cellulitis of groin 03/14/2020   Elevated LFTs 03/14/2020   HIV disease ([place])    Acute encephalopathy    Hyponatremia    Macrocytic anemia    Thrombocytopenia ([facility])    Prolonged QT interval    Acute respiratory failure due to COVID-19 ([place]) 10/27/2019   GERD (gastroesophageal reflux disease) 10/27/2019   Hypokalemia 10/27/2019   Peripheral neuropathy 10/01/2019   PTSD (post-traumatic stress disorder) 07/23/2018   Dizziness and giddiness 02/01/2016   Migraine headache 02/01/2016   Schizoaffective disorder, depressive type ([place]) 05/13/2015   Severe alcohol dependence ([place]) 05/13/2015   Suicidal ideation 01/12/2014   PCP:  [name], MD Pharmacy:   [place], [place] - [address] [place] [address] Phone: [phone] Fax: [phone]     Social Determinants of Health (SDOH) Interventions    Readmission Risk Interventions Readmission Risk Prevention Plan 08/24/2021 11/01/2019  PCP or Specialist Appt within 3-5 Days Complete -  Home Care Screening - Complete  Medication Review (RN CM) - Complete  HRI or Home Care Consult Complete -  Social Work Consult for Recovery Care Planning/Counseling Complete -  Palliative Care Screening Complete -  Medication Review (RN Care Manager) Complete -  Some recent data might be hidden

## 2021-08-24 NOTE — Progress Notes (Addendum)
PROGRESS NOTE    [name]  WCB:[id number] DOB: 17-Jun-1986 DOA: 08/23/2021 PCP: [name], MD    Brief Narrative:  [name] is a 35 year old male with past medical history significant for HIV, chronic avascular necrosis left hip, bipolar disorder, depression, PTSD, schizoaffective disorder, seizures, essential hypertension, peripheral neuropathy, severe alcohol dependence, tobacco use disorder, GERD who presented to [facility] ED on 11/14 via EMS after mechanical fall with laceration to right eyebrow.  On ED arrival, patient was somnolent but arousable.  He was confused and not able to give a meaningful history.  States he fell but does not recall events leading to this other than he admits to significant intoxication.  In the ED, patient was noted to be high both dermic with a temperature 92.3 F, hypotensive with a systolic BP in the low 61Y.  Patient was noted also to desaturate while sleeping with SPO2 80% requiring 2 L supplemental oxygen.  WBC 15.0, hemoglobin 6.7 (baseline 8-9), platelet 114.  Sodium 129, potassium 3.7, chloride 101, bicarb 21, BUN 8, creatinine 1.2.  Glucose 122.  Calcium 7.7, albumin less than 1.5, AST 51, ALT 68, alkaline phosphatase 279, total bilirubin within normal limits.  INR 1.5.  COVID-19 PCR negative.  Influenza A/B PCR negative.  Lactic acid 1.9.  Chest x-ray with no acute cardiopulmonary disease process.  X-ray left hip with chronic left hip vascular necrosis, no acute abnormality.  CT head/C-spine negative for acute findings.  EtOH level, blood cultures drawn, CD4 count, UA/UDS, urine culture ordered by EDP.  Patient was started on empiric antibiotics with cefepime, vancomycin, Flagyl, given 3 L NS bolus, 2 units PRBCs.  Blood pressure improved after IV fluid resuscitation.  Critical care felt patient was stable for stepdown admission.  Hospitalist service consulted for further evaluation management of acute metabolic encephalopathy, severe sepsis of  unclear etiology, and symptomatic anemia.   Assessment & Plan:   Principal Problem:   Severe sepsis ([place]) Active Problems:   Hyponatremia   Anemia   Hypothermia   Hypoxia   Acute metabolic encephalopathy, POA Patient presenting to the ED via EMS following fall.  Admits to heavy alcohol use, intoxication.  Also likely secondary to severe sepsis as below with chronic buttock/gluteal wounds with surrounding cellulitis.  Also with elevated ammonia level. --Improving --UDS: Pending --Continue treatment as below  Severe sepsis/shock, POA Buttock/gluteal wounds with surrounding cellulitis Patient met sepsis criteria on admission with tachycardia, hypothermia, leukocytosis in the setting of end organ damage acute metabolic encephalopathy, Liver injury, MAP <70, INR 1.5 with source of infection buttock/gluteal wounds and surrounding cellulitis.  Chest x-ray with no acute cardiopulmonary disease process.  COVID/influenza A/B PCR negative.  MRSA PCR positive. --Blood culture: pending --Urinalysis: Pending --Vancomycin, pharmacy consulted for dosing/monitoring --Cefepime --Metronidazole --Wound care consulted for assistance in the management of his chronic wounds  Addendum 1830: Reevaluated patient at bedside after nursing noted patient became hypothermic once again requesting Bair hugger placement.  At bedside, patient more lethargic and with persistent hypotension.  Patient refused and spit out his oral lactulose earlier.  Ordered 1 L NS bolus followed by Levophed infusion.  PCCM consulted for assistance with management of septic shock and acute metabolic encephalopathy.  Symptomatic anemia Hemoglobin on admission 6.7, baseline 8-9.  Denies any blood in the stool, states he has had some bleeding from his chronic wounds on occasion.  Also suspect chronic gastritis from continued alcohol abuse.  Transfused 2 unit PRBCs with improvement of hemoglobin to 8.1.  Patient  with history of colonoscopy  May 2022 with internal hemorrhoids, EGD August 2022 with gastritis.  Follows with medical oncology, Dr. Payton Mccallum outpatient. --FOBT: pending --Repeat CBC in the a.m. --If notable for dark stools or FOBT positive, will consider GI evaluation.  Hyponatremia Sodium 129 admission, likely secondary to chronic alcohol use versus poor p.o. intake.  Received IV fluid resuscitation on admission. --Na 129>134 --Repeat BMP in a.m.  Thrombocytopenia Etiology likely secondary to severe sepsis and chronic alcohol use. --CBC daily  Transaminitis Hepatic encephalopathy. Etiology likely secondary to severe sepsis with hypotension admission and chronic alcohol use.  Ammonia level elevated 94. --Give 1 dose of lactulose today --Acute hepatitis panel: Pending --Repeat LFTs and ammonia level in a.m.  Right eyebrow laceration Etiology likely from fall.  CT head/C-spine unrevealing.  Severe EtOH dependence Patient admits to continued significant alcohol abuse with intoxication.  EtOH level less than 10 on admission.  Patient reports history of mild withdrawal symptoms but denies any significant DTs or seizures. --CIWA protocol with symptom triggered Ativan --Thiamine, folic acid, multivitamin --TOC evaluation for substance use disorder  HIV Follows with infectious disease outpatient, [place], PAC. --CD4 count: Pending --Continue home Biktarvy, states compliance. --Valtrex 1000 mg p.o. daily  Chronic pain: --Oxycodone 5 mg every 6 hours as needed moderate pain  Chronic lower extremity edema Essential hypertension Initially was hypotensive on admission requiring IV fluid resuscitation, now resolved. --Continue home furosemide 40 mg p.o. daily  Tobacco use disorder Counseled on need for complete cessation --Nicotine patch  History of bipolar, schizoaffective, PTSD --Outpatient follow-up with behavioral health  QT prolongation --Avoid QT prolonging medications --Monitor on  telemetry  Chronic wounds, POA Not pressure related, likely related to his underlying medical conditions, HIV.  No odor, no fluctuance. --Wound care consulted --Right buttock wound Aquacel and cover with foam daily --Foam dressings to other open areas changed q3 days    DVT prophylaxis: SCDs Start: 08/24/21 0158   Code Status: Full Code Family Communication: No family present at bedside this morning  Disposition Plan:  Level of care: Stepdown Status is: Inpatient  Remains inpatient appropriate because: Antibiotics, continues with confusion greater than his baseline, concerned about ensuing withdrawal symptoms from alcohol abuse.   Consultants:  None  Procedures:  None  Antimicrobials:  Vancomycin 11/14>> Cefepime 11/14>> Metronidazole 11/14>>    Subjective: Patient seen examined at bedside, resting comfortably.  Continues with mild confusion.  RN and wound care nurse present at bedside.  Requesting restart of his Lasix, concerned about his chronic lower extremity edema.  Hypothermia and hypotension now resolved.  No other specific questions or concerns at this time.  Denies any blood in the stool, does endorse occasional bleeding from his chronic wounds.  No other questions or concerns at this time.  Objective: Vitals:   08/24/21 0516 08/24/21 0600 08/24/21 0805 08/24/21 1105  BP: 95/64 (!) 90/51    Pulse: 97 95    Resp: 18 13    Temp:   97.7 F (36.5 C) (!) 97.5 F (36.4 C)  TempSrc:   Axillary Axillary  SpO2: 100% 98%    Weight:      Height:        Intake/Output Summary (Last 24 hours) at 08/24/2021 1227 Last data filed at 08/24/2021 1225 Gross per 24 hour  Intake 6609.53 ml  Output 400 ml  Net 6209.53 ml   Filed Weights   08/23/21 1923  Weight: 98 kg    Examination:  General exam: Calm/comfortable, chronically ill  in appearance, appears older than stated age Respiratory system: Clear to auscultation. Respiratory effort normal.  On room  air Cardiovascular system: S1 & S2 heard, RRR. No JVD, murmurs, rubs, gallops or clicks. No pedal edema. Gastrointestinal system: Abdomen is nondistended, soft and nontender. No organomegaly or masses felt. Normal bowel sounds heard. Central nervous system: Alert and oriented. No focal neurological deficits. Extremities: Symmetric 5 x 5 power. Skin: Chronic wounds noted bilateral buttocks/gluteal region with surrounding erythema, no appreciable fluctuance or purulent discharge Psychiatry: Judgement and insight appear poor. Mood & affect appropriate.        Data Reviewed: I have personally reviewed following labs and imaging studies  CBC: Recent Labs  Lab 08/23/21 1956 08/24/21 0415  WBC 15.0* 15.6*  NEUTROABS 12.0*  --   HGB 6.7* 8.1*  HCT 20.8* 24.1*  MCV 104.5* 98.0  PLT 114* 258*   Basic Metabolic Panel: Recent Labs  Lab 08/23/21 1956 08/24/21 0415  NA 129* 134*  K 3.7 3.8  CL 101 107  CO2 21* 22  GLUCOSE 122* 98  BUN 8 8  CREATININE 1.26* 1.02  CALCIUM 7.7* 7.1*  MG  --  1.9   GFR: Estimated Creatinine Clearance: 118.5 mL/min (by C-G formula based on SCr of 1.02 mg/dL). Liver Function Tests: Recent Labs  Lab 08/23/21 1956 08/24/21 0415  AST 51* 43*  ALT 68* 58*  ALKPHOS 279* 270*  BILITOT 0.9 1.8*  PROT 6.6 6.0*  ALBUMIN <1.5* <1.5*   No results for input(s): LIPASE, AMYLASE in the last 168 hours. Recent Labs  Lab 08/24/21 0415  AMMONIA 94*   Coagulation Profile: Recent Labs  Lab 08/23/21 1956  INR 1.5*   Cardiac Enzymes: No results for input(s): CKTOTAL, CKMB, CKMBINDEX, TROPONINI in the last 168 hours. BNP (last 3 results) No results for input(s): PROBNP in the last 8760 hours. HbA1C: No results for input(s): HGBA1C in the last 72 hours. CBG: No results for input(s): GLUCAP in the last 168 hours. Lipid Profile: No results for input(s): CHOL, HDL, LDLCALC, TRIG, CHOLHDL, LDLDIRECT in the last 72 hours. Thyroid Function Tests: Recent  Labs    08/24/21 0415  TSH 2.303   Anemia Panel: Recent Labs    08/24/21 0415  VITAMINB12 3,689*   Sepsis Labs: Recent Labs  Lab 08/23/21 1956 08/23/21 2214 08/24/21 0415  PROCALCITON  --   --  6.15  LATICACIDVEN 3.5* 1.9  --     Recent Results (from the past 240 hour(s))  Resp Panel by RT-PCR (Flu A&B, Covid) Nasopharyngeal Swab     Status: None   Collection Time: 08/23/21  9:00 PM   Specimen: Nasopharyngeal Swab; Nasopharyngeal(NP) swabs in vial transport medium  Result Value Ref Range Status   SARS Coronavirus 2 by RT PCR NEGATIVE NEGATIVE Final    Comment: (NOTE) SARS-CoV-2 target nucleic acids are NOT DETECTED.  The SARS-CoV-2 RNA is generally detectable in upper respiratory specimens during the acute phase of infection. The lowest concentration of SARS-CoV-2 viral copies this assay can detect is 138 copies/mL. A negative result does not preclude SARS-Cov-2 infection and should not be used as the sole basis for treatment or other patient management decisions. A negative result may occur with  improper specimen collection/handling, submission of specimen other than nasopharyngeal swab, presence of viral mutation(s) within the areas targeted by this assay, and inadequate number of viral copies(<138 copies/mL). A negative result must be combined with clinical observations, patient history, and epidemiological information. The expected result is Negative.  Fact Sheet for Patients:  EntrepreneurPulse.com.au  Fact Sheet for Healthcare Providers:  IncredibleEmployment.be  This test is no t yet approved or cleared by the [place] FDA and  has been authorized for detection and/or diagnosis of SARS-CoV-2 by FDA under an Emergency Use Authorization (EUA). This EUA will remain  in effect (meaning this test can be used) for the duration of the COVID-19 declaration under Section 564(b)(1) of the Act, 21 U.S.C.section 360bbb-3(b)(1),  unless the authorization is terminated  or revoked sooner.       Influenza A by PCR NEGATIVE NEGATIVE Final   Influenza B by PCR NEGATIVE NEGATIVE Final    Comment: (NOTE) The Xpert Xpress SARS-CoV-2/FLU/RSV plus assay is intended as an aid in the diagnosis of influenza from Nasopharyngeal swab specimens and should not be used as a sole basis for treatment. Nasal washings and aspirates are unacceptable for Xpert Xpress SARS-CoV-2/FLU/RSV testing.  Fact Sheet for Patients: EntrepreneurPulse.com.au  Fact Sheet for Healthcare Providers: IncredibleEmployment.be  This test is not yet approved or cleared by the [place] FDA and has been authorized for detection and/or diagnosis of SARS-CoV-2 by FDA under an Emergency Use Authorization (EUA). This EUA will remain in effect (meaning this test can be used) for the duration of the COVID-19 declaration under Section 564(b)(1) of the Act, 21 U.S.C. section 360bbb-3(b)(1), unless the authorization is terminated or revoked.  Performed at [facility], [place] [address] [place], [place] [address]   MRSA Next Gen by PCR, Nasal     Status: Abnormal   Collection Time: 08/24/21  5:01 AM   Specimen: Nasal Mucosa; Nasal Swab  Result Value Ref Range Status   MRSA by PCR Next Gen DETECTED (A) NOT DETECTED Final    Comment: RESULT CALLED TO, READ BACK BY AND VERIFIED WITH: BULL, E. RN ON 08/24/2021 @ 9622 BY MECIAL J.  (NOTE) The GeneXpert MRSA Assay (FDA approved for NASAL specimens only), is one component of a comprehensive MRSA colonization surveillance program. It is not intended to diagnose MRSA infection nor to guide or monitor treatment for MRSA infections. Test performance is not FDA approved in patients less than 65 years old. Performed at [facility], [place] [address] [place], [place] [address]          Radiology Studies: CT Head Wo Contrast  Result  Date: 08/23/2021 CLINICAL DATA:  Fall EXAM: CT HEAD WITHOUT CONTRAST TECHNIQUE: Contiguous axial images were obtained from the base of the skull through the vertex without intravenous contrast. COMPARISON:  05/07/2021 FINDINGS: Brain: No acute intracranial abnormality. Specifically, no hemorrhage, hydrocephalus, mass lesion, acute infarction, or significant intracranial injury. Vascular: No hyperdense vessel or unexpected calcification. Skull: No acute calvarial abnormality. Sinuses/Orbits: No acute findings Other: None IMPRESSION: No acute intracranial abnormality. Electronically Signed   By: Rolm Baptise M.D.   On: 08/23/2021 22:52   CT Cervical Spine Wo Contrast  Result Date: 08/23/2021 CLINICAL DATA:  Neck trauma, dangerous injury mechanism (Age 69-64y). Fall. EXAM: CT CERVICAL SPINE WITHOUT CONTRAST TECHNIQUE: Multidetector CT imaging of the cervical spine was performed without intravenous contrast. Multiplanar CT image reconstructions were also generated. COMPARISON:  None. FINDINGS: Alignment: Normal Skull base and vertebrae: No acute fracture. No primary bone lesion or focal pathologic process. Soft tissues and spinal canal: No prevertebral fluid or swelling. No visible canal hematoma. Disc levels: Degenerative disc disease with disc space narrowing and spurring at C5-6. Upper chest: No acute findings Other: None IMPRESSION: No acute bony abnormality. Electronically  Signed   By: Rolm Baptise M.D.   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 The pelvic ring is intact although the patient is significantly rotated. Proximal right femur appears within normal  limits. No other focal abnormality is noted. 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 No acute abnormality is noted. Electronically Signed   By: Inez Catalina M.D.   On: 08/23/2021 20:38   DG Chest Port 1 View  Result Date: 08/23/2021 CLINICAL DATA:  Recent fall, possible sepsis, initial encounter EXAM: PORTABLE CHEST 1 VIEW COMPARISON:  05/07/2021 FINDINGS: Cardiac shadow is stable. Patient is rotated significantly to the left accentuating the mediastinal markings. The lungs appear clear but hypoinflated. No acute bony abnormality is noted. IMPRESSION: No acute abnormality noted. Electronically Signed   By: Inez Catalina M.D.   On: 08/23/2021 20:37        Scheduled Meds:  bictegravir-emtricitabine-tenofovir AF  1 tablet Oral Daily   Chlorhexidine Gluconate Cloth  6 each Topical Daily   Chlorhexidine Gluconate Cloth  6 each Topical K5625   folic acid  1 mg Oral Daily   furosemide  40 mg Oral Daily   multivitamin with minerals  1 tablet Oral Daily   mupirocin ointment  1 application Nasal BID   nicotine  14 mg Transdermal Daily   pantoprazole (PROTONIX) IV  40 mg Intravenous Q12H   [START ON 08/25/2021] pneumococcal 23 valent vaccine  0.5 mL Intramuscular Tomorrow-1000   potassium chloride  40 mEq Oral Once   thiamine  100 mg Oral Daily   Or   thiamine  100 mg Intravenous Daily   valACYclovir  1,000 mg Oral Daily   Continuous Infusions:  sodium chloride     sodium chloride 125 mL/hr at 08/24/21 1225   ceFEPime (MAXIPIME) IV Stopped (08/24/21 0612)   metronidazole Stopped (08/24/21 1047)   vancomycin Stopped (08/24/21 1123)     LOS: 1 day    Time spent: 46 minutes spent on chart review, discussion with nursing staff, consultants, updating family and interview/physical exam; more than 50% of that time was spent in counseling and/or coordination of care.    Conlan Miceli J [place] DO [facility] Available via Epic secure chat 7am-7pm After these hours, please refer to coverage provider listed on [email or web address] 08/24/2021, 12:27 PM

## 2021-08-24 NOTE — Progress Notes (Signed)
Pharmacy Antibiotic Note  [name] is a 35 y.o. male admitted on 08/23/2021 with hx of avascular necrosis of the right hip and HIV came to ED for a fall.  When arrived his temp was 92.3.  Pharmacy has been consulted to dose vancomycin and cefepime for sepsis.  1st doses given in ED  Plan: Vancomycin 1568m IV q12h (AUC 506, Scr 1.26) Cefepime 2gm IV q8h Follow renal function, cultures and clinical course  Height: 6' 3"  (190.5 cm) Weight: 98 kg (216 lb) IBW/kg (Calculated) : 84.5  Temp (24hrs), Avg:94.7 F (34.8 C), Min:92.3 F (33.5 C), Max:96.4 F (35.8 C)  Recent Labs  Lab 08/23/21 1956 08/23/21 2214  WBC 15.0*  --   CREATININE 1.26*  --   LATICACIDVEN 3.5* 1.9    Estimated Creatinine Clearance: 95.9 mL/min (A) (by C-G formula based on SCr of 1.26 mg/dL (H)).    Allergies  Allergen Reactions   Dapsone Other (See Comments)    Per centricity "G6PD deficient"   Primaquine Phosphate Other (See Comments)    Per Centricity "G6PD deficient"    Antimicrobials this admission: Vanc 11/14 >> Cefepime 11/15>> Flagyl 11/14>>  Dose adjustments this admission:   Microbiology results: 11/14 BCx:  11/14 UCx:   Thank you for allowing pharmacy to be a part of this patient's care.  EDolly RiasRPh 08/24/2021, 2:31 AM

## 2021-08-24 NOTE — Progress Notes (Signed)
Peripherally Inserted Central Catheter Placement  The IV Nurse has discussed with the patient and/or persons authorized to consent for the patient, the purpose of this procedure and the potential benefits and risks involved with this procedure.  The benefits include less needle sticks, lab draws from the catheter, and the patient may be discharged home with the catheter. Risks include, but not limited to, infection, bleeding, blood clot (thrombus formation), and puncture of an artery; nerve damage and irregular heartbeat and possibility to perform a PICC exchange if needed/ordered by physician.  Alternatives to this procedure were also discussed.  Bard Power PICC patient education guide, fact sheet on infection prevention and patient information card has been provided to patient /or left at bedside.  PICC inserted by Audie Box, RN  PICC Placement Documentation  PICC Double Lumen 08/24/21 PICC Right Brachial 42 cm 0 cm (Active)  Indication for Insertion or Continuance of Line Vasoactive infusions 08/24/21 2027  Exposed Catheter (cm) 0 cm 08/24/21 2027  Site Assessment Clean;Dry;Intact 08/24/21 2027  Lumen #1 Status Flushed;Saline locked;Blood return noted 08/24/21 2027  Lumen #2 Status Flushed;Saline locked;Blood return noted 08/24/21 2027  Dressing Type Transparent 08/24/21 2027  Dressing Status Clean;Dry;Intact 08/24/21 2027  Antimicrobial disc in place? Yes 08/24/21 2027  Safety Lock Not Applicable 65/68/12 7517  Line Care Connections checked and tightened 08/24/21 2027  Dressing Intervention New dressing 08/24/21 2027  Dressing Change Due 08/31/21 08/24/21 2027       Alphonzo Devera, Nicolette Bang 08/24/2021, 8:28 PM

## 2021-08-24 NOTE — ED Notes (Signed)
ED TO INPATIENT HANDOFF REPORT  Name/Age/Gender [name] 35 y.o. male  Code Status    Code Status Orders  (From admission, onward)           Start     Ordered   08/24/21 0158  Full code  Continuous        08/24/21 0200           Code Status History     Date Active Date Inactive Code Status Order ID Comments User Context   05/07/2021 1912 05/19/2021 1841 Full Code [id number]  [name], MD ED   03/15/2020 0606 03/20/2020 1650 Full Code [id number]  [name], MD ED   10/27/2019 1815 11/02/2019 1902 Full Code [id number]  [name], MD Inpatient   07/22/2018 1722 07/22/2018 2158 Full Code [id number]  [name], NP Inpatient   07/22/2018 0832 07/22/2018 1703 Full Code [id number]  [name], MD ED   05/12/2015 1753 05/15/2015 1723 Full Code [id number]  [name], NP Inpatient   05/12/2015 0546 05/12/2015 1753 Full Code [id number]  [name], MD ED   01/12/2014 0256 01/12/2014 1650 Full Code [id number]  [name], MD ED       Home/SNF/Other Home  Chief Complaint Severe sepsis ([place]) [A41.9, R65.20]  Level of Care/Admitting Diagnosis ED Disposition     ED Disposition  Admit   Condition  --   [facility]: [place] [[id number]] Level of Care: Stepdown [14] Admit to SDU based on following criteria: Hemodynamic compromise or significant risk of instability:  Patient requiring short term acute titration and management of  vasoactive drips, and invasive monitoring (i.e., CVP and Arterial line). May admit patient to [facility] or [facility] if equivalent level of care is available:: Yes Covid Evaluation: Covid negative Diagnosis: Severe sepsis ([place]) [[id number]] Admi tting Physician: [name] [[id number]] Attending Physician: [name] [[id number]] Estimated length of stay: past midnight tomorrow Certification:: I certify this patient will need inpatient services for at least 2  midnights          Medical History Past Medical History:  Diagnosis Date   Bipolar 1 disorder ([place])    Depression    Dizziness and giddiness 02/01/2016   Herpes genitalia    HIV disease ([place])    Hypertension    Migraine headache 02/01/2016   Peripheral neuropathy 10/01/2019   PTSD (post-traumatic stress disorder)    Schizoaffective disorder ([place])    Seizures ([place])     Allergies Allergies  Allergen Reactions   Dapsone Other (See Comments)    Per centricity "G6PD deficient"   Primaquine Phosphate Other (See Comments)    Per Centricity "G6PD deficient"    IV Location/Drains/Wounds Patient Lines/Drains/Airways Status     Active Line/Drains/Airways     Name Placement date Placement time Site Days   Peripheral IV 08/23/21 20 G Anterior;Distal;Left;Upper Arm 08/23/21  1916  Arm  1   Peripheral IV 08/23/21 20 G Right Antecubital 08/23/21  1937  Antecubital  1   Wound / Incision (Open or Dehisced) 05/08/21 Non-pressure wound Thigh Anterior;Right Open circular wound 05/08/21  1300  Thigh  108   Wound / Incision (Open or Dehisced) 05/08/21 Non-pressure wound Thigh Posterior;Right Open circular wound 05/08/21  1503  Thigh  108   Wound / Incision (Open or Dehisced) 05/08/21 Non-pressure wound Thigh Distal;Posterior;Right Circular, open wound 05/08/21  1517  Thigh  108   Wound / Incision (Open or Dehisced) 05/08/21 Non-pressure  wound Thigh Anterior;Left 05/08/21  1519  Thigh  108   Wound / Incision (Open or Dehisced) 05/08/21 Non-pressure wound Thigh Left;Posterior 05/08/21  1520  Thigh  108            Labs/Imaging Results for orders placed or performed during the hospital encounter of 08/23/21 (from the past 48 hour(s))  Lactic acid, plasma     Status: Abnormal   Collection Time: 08/23/21  7:56 PM  Result Value Ref Range   Lactic Acid, Venous 3.5 (HH) 0.5 - 1.9 mmol/L    Comment: CRITICAL RESULT CALLED TO, READ BACK BY AND VERIFIED WITH:  Javaris Wigington RN 08/23/21 @ 2036  VS Performed at [facility], [place] [address], [place], [place] [address]   Comprehensive metabolic panel     Status: Abnormal   Collection Time: 08/23/21  7:56 PM  Result Value Ref Range   Sodium 129 (L) 135 - 145 mmol/L   Potassium 3.7 3.5 - 5.1 mmol/L   Chloride 101 98 - 111 mmol/L   CO2 21 (L) 22 - 32 mmol/L   Glucose, Bld 122 (H) 70 - 99 mg/dL    Comment: Glucose reference range applies only to samples taken after fasting for at least 8 hours.   BUN 8 6 - 20 mg/dL   Creatinine, Ser 1.26 (H) 0.61 - 1.24 mg/dL   Calcium 7.7 (L) 8.9 - 10.3 mg/dL   Total Protein 6.6 6.5 - 8.1 g/dL   Albumin <1.5 (L) 3.5 - 5.0 g/dL   AST 51 (H) 15 - 41 U/L   ALT 68 (H) 0 - 44 U/L   Alkaline Phosphatase 279 (H) 38 - 126 U/L   Total Bilirubin 0.9 0.3 - 1.2 mg/dL   GFR, Estimated >60 >60 mL/min    Comment: (NOTE) Calculated using the CKD-EPI Creatinine Equation (2021)    Anion gap 7 5 - 15    Comment: Performed at [facility], [place] [address] [place], [place] [address]  CBC WITH DIFFERENTIAL     Status: Abnormal   Collection Time: 08/23/21  7:56 PM  Result Value Ref Range   WBC 15.0 (H) 4.0 - 10.5 K/uL   RBC 1.99 (L) 4.22 - 5.81 MIL/uL   Hemoglobin 6.7 (LL) 13.0 - 17.0 g/dL    Comment: REPEATED TO VERIFY THIS CRITICAL RESULT HAS VERIFIED AND BEEN CALLED TO LOU MERRICKS RN. BY TAMEECO CALDWELL ON 11 14 2022 AT 2119, AND HAS BEEN READ BACK.     HCT 20.8 (L) 39.0 - 52.0 %   MCV 104.5 (H) 80.0 - 100.0 fL   MCH 33.7 26.0 - 34.0 pg   MCHC 32.2 30.0 - 36.0 g/dL   RDW 18.9 (H) 11.5 - 15.5 %   Platelets 114 (L) 150 - 400 K/uL    Comment: SPECIMEN CHECKED FOR CLOTS Immature Platelet Fraction may be clinically indicated, consider ordering this additional test [id number] REPEATED TO VERIFY PLATELET COUNT CONFIRMED BY SMEAR    nRBC 0.0 0.0 - 0.2 %   Neutrophils Relative % 80 %   Neutro Abs 12.0 (H) 1.7 - 7.7 K/uL   Lymphocytes Relative 13 %   Lymphs Abs 1.9  0.7 - 4.0 K/uL   Monocytes Relative 6 %   Monocytes Absolute 0.9 0.1 - 1.0 K/uL   Eosinophils Relative 0 %   Eosinophils Absolute 0.1 0.0 - 0.5 K/uL   Basophils Relative 0 %   Basophils Absolute 0.0 0.0 - 0.1 K/uL   Immature Granulocytes  1 %   Abs Immature Granulocytes 0.14 (H) 0.00 - 0.07 K/uL    Comment: Performed at [facility], [place] [address] [place], [place] [address]  Protime-INR     Status: Abnormal   Collection Time: 08/23/21  7:56 PM  Result Value Ref Range   Prothrombin Time 17.8 (H) 11.4 - 15.2 seconds   INR 1.5 (H) 0.8 - 1.2    Comment: (NOTE) INR goal varies based on device and disease states. Performed at [facility], [place] [address] [place], [place] [address]   APTT     Status: None   Collection Time: 08/23/21  7:56 PM  Result Value Ref Range   aPTT 35 24 - 36 seconds    Comment: Performed at [facility], [place] [address] [place], [place] [address]  Resp Panel by RT-PCR (Flu A&B, Covid) Nasopharyngeal Swab     Status: None   Collection Time: 08/23/21  9:00 PM   Specimen: Nasopharyngeal Swab; Nasopharyngeal(NP) swabs in vial transport medium  Result Value Ref Range   SARS Coronavirus 2 by RT PCR NEGATIVE NEGATIVE    Comment: (NOTE) SARS-CoV-2 target nucleic acids are NOT DETECTED.  The SARS-CoV-2 RNA is generally detectable in upper respiratory specimens during the acute phase of infection. The lowest concentration of SARS-CoV-2 viral copies this assay can detect is 138 copies/mL. A negative result does not preclude SARS-Cov-2 infection and should not be used as the sole basis for treatment or other patient management decisions. A negative result may occur with  improper specimen collection/handling, submission of specimen other than nasopharyngeal swab, presence of viral mutation(s) within the areas targeted by this assay, and inadequate number of viral copies(<138 copies/mL). A negative result must  be combined with clinical observations, patient history, and epidemiological information. The expected result is Negative.  Fact Sheet for Patients:  EntrepreneurPulse.com.au  Fact Sheet for Healthcare Providers:  IncredibleEmployment.be  This test is no t yet approved or cleared by the [place] FDA and  has been authorized for detection and/or diagnosis of SARS-CoV-2 by FDA under an Emergency Use Authorization (EUA). This EUA will remain  in effect (meaning this test can be used) for the duration of the COVID-19 declaration under Section 564(b)(1) of the Act, 21 U.S.C.section 360bbb-3(b)(1), unless the authorization is terminated  or revoked sooner.       Influenza A by PCR NEGATIVE NEGATIVE   Influenza B by PCR NEGATIVE NEGATIVE    Comment: (NOTE) The Xpert Xpress SARS-CoV-2/FLU/RSV plus assay is intended as an aid in the diagnosis of influenza from Nasopharyngeal swab specimens and should not be used as a sole basis for treatment. Nasal washings and aspirates are unacceptable for Xpert Xpress SARS-CoV-2/FLU/RSV testing.  Fact Sheet for Patients: EntrepreneurPulse.com.au  Fact Sheet for Healthcare Providers: IncredibleEmployment.be  This test is not yet approved or cleared by the [place] FDA and has been authorized for detection and/or diagnosis of SARS-CoV-2 by FDA under an Emergency Use Authorization (EUA). This EUA will remain in effect (meaning this test can be used) for the duration of the COVID-19 declaration under Section 564(b)(1) of the Act, 21 U.S.C. section 360bbb-3(b)(1), unless the authorization is terminated or revoked.  Performed at [facility], [place] [address], [place], [place] [address]   Type and screen     Status: None (Preliminary result)   Collection Time: 08/23/21 10:04 PM  Result Value Ref Range   ABO/RH(D) O POS    Antibody Screen NEG  Sample Expiration 08/26/2021,2359    Unit Number [id number]    Blood Component Type RED CELLS,LR    Unit division 00    Status of Unit ISSUED    Transfusion Status OK TO TRANSFUSE    Crossmatch Result Compatible    Unit Number [id number]    Blood Component Type RED CELLS,LR    Unit division 00    Status of Unit ISSUED    Transfusion Status OK TO TRANSFUSE    Crossmatch Result      Compatible Performed at [facility], [place] [address] [place], [place] [address]   Prepare RBC (crossmatch)     Status: None   Collection Time: 08/23/21 10:04 PM  Result Value Ref Range   Order Confirmation      ORDER PROCESSED BY BLOOD BANK Performed at [facility], [place] [address], [place], [place] [address]   Lactic acid, plasma     Status: None   Collection Time: 08/23/21 10:14 PM  Result Value Ref Range   Lactic Acid, Venous 1.9 0.5 - 1.9 mmol/L    Comment: Performed at [facility], [place] [address], [place],  [address]   CT Head Wo Contrast  Result Date: 08/23/2021 CLINICAL DATA:  Fall EXAM: CT HEAD WITHOUT CONTRAST TECHNIQUE: Contiguous axial images were obtained from the base of the skull through the vertex without intravenous contrast. COMPARISON:  05/07/2021 FINDINGS: Brain: No acute intracranial abnormality. Specifically, no hemorrhage, hydrocephalus, mass lesion, acute infarction, or significant intracranial injury. Vascular: No hyperdense vessel or unexpected calcification. Skull: No acute calvarial abnormality. Sinuses/Orbits: No acute findings Other: None IMPRESSION: No acute intracranial abnormality. Electronically Signed   By: Rolm Baptise M.D.   On: 08/23/2021 22:52   CT Cervical Spine Wo Contrast  Result Date: 08/23/2021 CLINICAL DATA:  Neck trauma, dangerous injury mechanism (Age 60-64y). Fall. EXAM: CT CERVICAL SPINE WITHOUT CONTRAST TECHNIQUE: Multidetector CT imaging of the cervical spine was performed without  intravenous contrast. Multiplanar CT image reconstructions were also generated. COMPARISON:  None. FINDINGS: Alignment: Normal Skull base and vertebrae: No acute fracture. No primary bone lesion or focal pathologic process. Soft tissues and spinal canal: No prevertebral fluid or swelling. No visible canal hematoma. Disc levels: Degenerative disc disease with disc space narrowing and spurring at C5-6. Upper chest: No acute findings Other: None IMPRESSION: No acute bony abnormality. Electronically Signed   By: Rolm Baptise M.D.   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 The pelvic ring is intact although the patient is significantly rotated. Proximal right femur appears within normal limits. No other focal abnormality is noted. 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 No acute abnormality is noted. Electronically Signed   By: Inez Catalina M.D.   On: 08/23/2021 20:38   DG Chest Port 1 View  Result Date: 08/23/2021 CLINICAL DATA:  Recent fall, possible sepsis, initial encounter EXAM: PORTABLE CHEST 1 VIEW COMPARISON:  05/07/2021 FINDINGS: Cardiac shadow is stable. Patient is rotated significantly to the left accentuating the mediastinal markings. The lungs appear clear but hypoinflated. No acute bony abnormality is noted.  IMPRESSION: No acute abnormality noted. Electronically Signed   By: Inez Catalina M.D.   On: 08/23/2021 20:37    Pending Labs Unresulted Labs (From admission, onward)     Start     Ordered   08/24/21 0500  CBC  Tomorrow morning,   R        08/24/21 0200    08/24/21 0500  Comprehensive metabolic panel  Tomorrow morning,   R        08/24/21 0200   08/24/21 0500  Cortisol-am, blood  Tomorrow morning,   R        08/24/21 0200   08/24/21 0500  Procalcitonin - Baseline  Tomorrow morning,   R        08/24/21 0200   08/24/21 0226  Vitamin B12  Once,   R        08/24/21 0225   08/24/21 0226  Ammonia  Once,   R        08/24/21 0225   08/24/21 0201  Occult blood card to lab, stool  Once,   R        08/24/21 0200   08/24/21 0200  Hepatitis panel, acute  Once,   R        08/24/21 0200   08/24/21 0155  Magnesium  Once,   R        08/24/21 0200   08/23/21 2352  Urine rapid drug screen (hosp performed)  ONCE - STAT,   STAT        08/23/21 2351   08/23/21 2250  TSH  Once,   STAT        08/23/21 2249   08/23/21 2000  T-helper cells (CD4) count (not at [facility])  Once,   STAT        08/23/21 1959   08/23/21 1957  Ethanol  ONCE - STAT,   STAT        08/23/21 1957   08/23/21 1956  Blood Culture (routine x 2)  (Septic presentation on arrival (screening labs, nursing and treatment orders for obvious sepsis))  BLOOD CULTURE X 2,   STAT      08/23/21 1956   08/23/21 1956  Urinalysis, Routine w reflex microscopic  (Septic presentation on arrival (screening labs, nursing and treatment orders for obvious sepsis))  ONCE - STAT,   STAT        08/23/21 1956   08/23/21 1956  Urine Culture  (Septic presentation on arrival (screening labs, nursing and treatment orders for obvious sepsis))  ONCE - STAT,   STAT       Question:  Indication  Answer:  Sepsis   08/23/21 1956            Vitals/Pain Today's Vitals   08/24/21 0300 08/24/21 0315 08/24/21 0330 08/24/21 0345  BP: 109/62 (!) 98/55 109/62 117/67  Pulse: 98 99 99 99  Resp: 19 19 13 16   Temp:  97.6 F (36.4 C)    TempSrc:  Rectal    SpO2: 100% 96% 98% 98%  Weight:      Height:      PainSc:        Isolation Precautions No active isolations  Medications Medications  0.9 %  sodium chloride infusion  (has no administration in time range)  metroNIDAZOLE (FLAGYL) IVPB 500 mg (has no administration in time range)  potassium chloride SA (KLOR-CON) CR tablet 40 mEq (has no administration in time range)  LORazepam (ATIVAN) tablet 1-4 mg (has no administration in  time range)    Or  LORazepam (ATIVAN) injection 1-4 mg (has no administration in time range)  thiamine tablet 100 mg (has no administration in time range)    Or  thiamine (B-1) injection 100 mg (has no administration in time range)  folic acid (FOLVITE) tablet 1 mg (has no administration in time range)  multivitamin with minerals tablet 1 tablet (has no administration in time range)  nicotine (NICODERM CQ - dosed in mg/24 hours) patch 14 mg (has no administration in time range)  pantoprazole (PROTONIX) injection 40 mg (has no administration in time range)  0.9 %  sodium chloride infusion (has no administration in time range)  vancomycin (VANCOREADY) IVPB 1500 mg/300 mL (has no administration in time range)  ceFEPIme (MAXIPIME) 2 g in sodium chloride 0.9 % 100 mL IVPB (has no administration in time range)  sodium chloride 0.9 % bolus 1,000 mL (0 mLs Intravenous Stopped 08/23/21 2218)    And  sodium chloride 0.9 % bolus 1,000 mL (0 mLs Intravenous Stopped 08/23/21 2315)    And  sodium chloride 0.9 % bolus 1,000 mL (0 mLs Intravenous Stopped 08/24/21 0155)  ceFEPIme (MAXIPIME) 2 g in sodium chloride 0.9 % 100 mL IVPB (0 g Intravenous Stopped 08/24/21 0046)  metroNIDAZOLE (FLAGYL) IVPB 500 mg (0 mg Intravenous Stopped 08/24/21 0001)  vancomycin (VANCOREADY) IVPB 2000 mg/400 mL (0 mg Intravenous Stopped 08/23/21 2250)    Mobility manual wheelchair

## 2021-08-24 NOTE — Consult Note (Addendum)
[place] Nurse Consult Note: Reason for Consult: Consult requested for multiple wounds.  Pt is familiar to [facility] team from recent admission on 05/10/21.  He has multpile red, round "punched out in appearance" wounds which are scattered across bilat buttocks, right arm, upper posterior legs/thighs.  Multiple patchy areas of pink dry scar tissue from areas which had previous wounds and have healed.  Etiology is unusual in appearance and unknown, but pt has HIV and they are probably related to this medical condition.  Mod amt tan drainage; no odor or fluctuance to indicate infection.  Reight buttock with the most extensive wound; 1X1.3cm, 20% yellow, 80% red.  Pressure Injury POA: Yes; these are not pressure injuries and all very present on admission. Dressing procedure/placement/frequency: Topical treatment orders provided for bedside nurses to perform as follows to promote drying and healing: 1. Apply piece of Aquacel Kellie Simmering # [phone]) to right buttock wound Q day, then cover with foam dressing . (Change foam dressings Q 3 days or PRN soiling.) 2. Foam dressings to other open wound areas, change Q 3 days or PRN soiling. Please re-consult if further assistance is needed.  Thank-you,  Julien Girt MSN, [place], [place], [place], [place]

## 2021-08-24 NOTE — H&P (Addendum)
History and Physical    [name] DVV:[id number] DOB: [birth year] DOA: 08/23/2021  PCP: [name], MD Patient coming from: Home  Chief Complaint: Fall  HPI: [name] is a 35 y.o. 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  In the ED hypothermic with temperature 92.3 F and hypotensive with systolic as low as 62I.  Not hypoxic when awake but oxygen saturation dropped to 80% when asleep, placed on 2 L supplemental oxygen.  Labs showing WBC 15.0, hemoglobin 6.7 (baseline 8-9 range), platelet count 114k.  Sodium 129, potassium 3.7, chloride 101, bicarb 21, BUN 8, creatinine 1.2 (stable), glucose 122.  Calcium 7.7, albumin <1.5.  AST 51, ALT 68, alk phos 279, T bili normal.  INR 1.5.  Blood ethanol level pending.  Blood culture drawn.  CD4 count pending.  UA and urine culture pending.  COVID and influenza PCR negative.  Lactic acid 1.9.  TSH pending.  UDS pending.  Chest x-ray showing no acute finding.  X-ray showing findings consistent with chronic left hip avascular necrosis; no acute abnormality.  CT head and C-spine negative for acute finding. Patient was given cefepime, vancomycin, Flagyl, 3 L normal saline boluses, and 2 units PRBCs ordered.  Blood pressure improved after IV fluid resuscitation.  Critical care felt that the patient was stable for stepdown admission.  Patient somnolent but arousable.  Confused and not able to give a meaningful history.  States he fell.  Very difficult to understand him as he is whispering .  Review of Systems:  All systems reviewed and apart from history of presenting illness, are negative.  Past Medical History:  Diagnosis Date    Bipolar 1 disorder ([place])    Depression    Dizziness and giddiness 02/01/2016   Herpes genitalia    HIV disease ([place])    Hypertension    Migraine headache 02/01/2016   Peripheral neuropathy 10/01/2019   PTSD (post-traumatic stress disorder)    Schizoaffective disorder ([place])    Seizures ([place])     Past Surgical History:  Procedure Laterality Date   BACK SURGERY     BIOPSY  02/26/2021   Procedure: BIOPSY;  Surgeon: Otis Brace, MD;  Location: [facility];  Service: Gastroenterology;;   COLONOSCOPY WITH PROPOFOL [name] 02/26/2021   Procedure: COLONOSCOPY WITH PROPOFOL;  Surgeon: Otis Brace, MD;  Location: [facility];  Service: Gastroenterology;  Laterality: [name];   ESOPHAGOGASTRODUODENOSCOPY (EGD) WITH PROPOFOL [name] 02/26/2021   Procedure: ESOPHAGOGASTRODUODENOSCOPY (EGD) WITH PROPOFOL;  Surgeon: Otis Brace, MD;  Location: [facility];  Service: Gastroenterology;  Laterality: [name];   ESOPHAGOGASTRODUODENOSCOPY (EGD) WITH PROPOFOL [name] 05/18/2021   Procedure: ESOPHAGOGASTRODUODENOSCOPY (EGD) WITH PROPOFOL;  Surgeon: Otis Brace, MD;  Location: [facility];  Service: Gastroenterology;  Laterality: [name];   HAND SURGERY       reports that he has been smoking cigarettes. He has been smoking an average of 1 pack per day. He has never used smokeless tobacco. He reports current alcohol use of about 14.0 standard drinks per week. He reports that he does not use drugs.  Allergies  Allergen Reactions   Dapsone Other (See Comments)    Per centricity "G6PD deficient"   Primaquine Phosphate Other (See Comments)    Per Centricity "G6PD deficient"  Family History  Problem Relation Age of Onset   Alcohol abuse Mother    Schizophrenia Father    Depression Father    Alcohol abuse Father    Alcohol abuse Paternal Uncle    Alcohol abuse Paternal Uncle     Prior to Admission medications   Medication Sig Start Date End Date Taking? Authorizing Provider  albuterol (PROVENTIL  HFA;VENTOLIN HFA) 108 (90 BASE) MCG/ACT inhaler Inhale 2 puffs into the lungs every 6 (six) hours as needed for wheezing or shortness of breath.    [provider]  alprazolam Duanne Moron) 2 MG tablet Take 2 mg by mouth 3 (three) times daily.    [provider]  benzonatate (TESSALON) 100 MG capsule Take 200 mg by mouth every 8 (eight) hours as needed for cough. 10/23/19   [provider]  bictegravir-emtricitabine-tenofovir AF (BIKTARVY) 50-200-25 MG TABS tablet Take 1 tablet by mouth daily.  02/24/17   [provider]  diphenoxylate-atropine (LOMOTIL) 2.5-0.025 MG tablet Take 1 tablet by mouth every 8 (eight) hours as needed for diarrhea or loose stools. 02/16/21   [provider]  escitalopram (LEXAPRO) 20 MG tablet Take 1 tablet by mouth daily. 02/16/21   [provider]  folic acid (FOLVITE) 1 MG tablet Take 1 mg by mouth daily. 10/03/19   [provider]  furosemide (LASIX) 40 MG tablet Take 40 mg by mouth daily as needed for fluid. 02/27/20   [provider]  lurasidone 120 MG TABS Take 1 tablet (120 mg total) by mouth daily. 05/15/15   Niel Hummer, NP  magnesium oxide (MAG-OX) 400 (240 Mg) MG tablet Take 1 tablet (400 mg total) by mouth daily. 05/19/21   Geradine Girt, DO  ondansetron (ZOFRAN) 8 MG tablet Take 8 mg by mouth every 8 (eight) hours as needed for nausea/vomiting, nausea or vomiting. 10/03/19   [provider]  pantoprazole (PROTONIX) 40 MG tablet Take 1 tablet (40 mg total) by mouth daily. 05/20/21   Geradine Girt, DO  thiamine 100 MG tablet Take 2.5 tablets (250 mg total) by mouth daily. 05/20/21   Geradine Girt, DO  traZODone (DESYREL) 100 MG tablet Take 2 tablets (200 mg total) by mouth at bedtime as needed for sleep. 05/19/21   Geradine Girt, DO  valACYclovir (VALTREX) 1000 MG tablet Take 1 tablet (1,000 mg total) by mouth daily. 05/15/15   Niel Hummer, NP    Physical Exam: Vitals:   08/24/21 0030  08/24/21 0045 08/24/21 0114 08/24/21 0145  BP: (!) 102/55 101/60 110/63 (!) 104/59  Pulse: 87 88 93 94  Resp: 13 15 15 18   Temp:   (!) 95.7 F (35.4 C) (!) 96 F (35.6 C)  TempSrc:   Rectal Rectal  SpO2: 100% 100% 100% 100%  Weight:      Height:        Physical Exam Constitutional:      General: He is not in acute distress. HENT:     Head: Normocephalic.     Comments: Right eyebrow laceration without active bleeding    Mouth/Throat:     Mouth: Mucous membranes are dry.  Eyes:     Extraocular Movements: Extraocular movements intact.  Cardiovascular:     Rate and Rhythm: Normal rate and regular rhythm.     Pulses: Normal pulses.  Pulmonary:     Effort: Pulmonary effort is normal. No respiratory distress.     Breath sounds: Normal breath sounds. No wheezing or rales.  Abdominal:     General: Bowel sounds are normal. There is no distension.     Palpations: Abdomen is soft.     Tenderness: There is no abdominal tenderness.  Musculoskeletal:        General: No swelling or tenderness.     Cervical back: Normal range of motion and neck supple.  Skin:    General: Skin is warm and dry.  Neurological:     Mental Status: He is alert.     Comments: Somnolent but arousable Confused Moving all extremities on command, no focal weakness     Labs on Admission: I have personally reviewed following labs and imaging studies  CBC: Recent Labs  Lab 08/23/21 1956  WBC 15.0*  NEUTROABS 12.0*  HGB 6.7*  HCT 20.8*  MCV 104.5*  PLT 294*   Basic Metabolic Panel: Recent Labs  Lab 08/23/21 1956  NA 129*  K 3.7  CL 101  CO2 21*  GLUCOSE 122*  BUN 8  CREATININE 1.26*  CALCIUM 7.7*   GFR: Estimated Creatinine Clearance: 95.9 mL/min (A) (by C-G formula based on SCr of 1.26 mg/dL (H)). Liver Function Tests: Recent Labs  Lab 08/23/21 1956  AST 51*  ALT 68*  ALKPHOS 279*  BILITOT 0.9  PROT 6.6  ALBUMIN <1.5*   No results for input(s): LIPASE, AMYLASE in the last 168  hours. No results for input(s): AMMONIA in the last 168 hours. Coagulation Profile: Recent Labs  Lab 08/23/21 1956  INR 1.5*   Cardiac Enzymes: No results for input(s): CKTOTAL, CKMB, CKMBINDEX, TROPONINI in the last 168 hours. BNP (last 3 results) No results for input(s): PROBNP in the last 8760 hours. HbA1C: No results for input(s): HGBA1C in the last 72 hours. CBG: No results for input(s): GLUCAP in the last 168 hours. Lipid Profile: No results for input(s): CHOL, HDL, LDLCALC, TRIG, CHOLHDL, LDLDIRECT in the last 72 hours. Thyroid Function Tests: No results for input(s): TSH, T4TOTAL, FREET4, T3FREE, THYROIDAB in the last 72 hours. Anemia Panel: No results for input(s): VITAMINB12, FOLATE, FERRITIN, TIBC, IRON, RETICCTPCT in the last 72 hours. Urine analysis:    Component Value Date/Time   COLORURINE YELLOW 05/07/2021 1702   APPEARANCEUR HAZY (A) 05/07/2021 1702   LABSPEC 1.005 05/07/2021 1702   PHURINE 6.0 05/07/2021 1702   GLUCOSEU NEGATIVE 05/07/2021 1702   HGBUR MODERATE (A) 05/07/2021 1702   BILIRUBINUR NEGATIVE 05/07/2021 1702   KETONESUR 5 (A) 05/07/2021 1702   PROTEINUR NEGATIVE 05/07/2021 1702   UROBILINOGEN 0.2 12/21/2014 1006   NITRITE POSITIVE (A) 05/07/2021 1702   LEUKOCYTESUR LARGE (A) 05/07/2021 1702    Radiological Exams on Admission: CT Head Wo Contrast  Result Date: 08/23/2021 CLINICAL DATA:  Fall EXAM: CT HEAD WITHOUT CONTRAST TECHNIQUE: Contiguous axial images were obtained from the base of the skull through the vertex without intravenous contrast. COMPARISON:  05/07/2021 FINDINGS: Brain: No acute intracranial abnormality. Specifically, no hemorrhage, hydrocephalus, mass lesion, acute infarction, or significant intracranial injury. Vascular: No hyperdense vessel or unexpected calcification. Skull: No acute calvarial abnormality. Sinuses/Orbits: No acute findings Other: None IMPRESSION: No acute intracranial abnormality. Electronically Signed   By:  Rolm Baptise M.D.   On: 08/23/2021 22:52   CT Cervical Spine Wo Contrast  Result Date: 08/23/2021 CLINICAL DATA:  Neck trauma, dangerous injury mechanism (Age 56-64y). Fall. EXAM: CT CERVICAL SPINE WITHOUT CONTRAST TECHNIQUE: Multidetector CT imaging of the cervical spine was performed without intravenous contrast. Multiplanar CT image reconstructions were also generated. COMPARISON:  None. FINDINGS: Alignment: Normal  Skull base and vertebrae: No acute fracture. No primary bone lesion or focal pathologic process. Soft tissues and spinal canal: No prevertebral fluid or swelling. No visible canal hematoma. Disc levels: Degenerative disc disease with disc space narrowing and spurring at C5-6. Upper chest: No acute findings Other: None IMPRESSION: No acute bony abnormality. Electronically Signed   By: Rolm Baptise M.D.   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 The pelvic ring is intact although the patient is significantly rotated. Proximal right femur appears within normal limits. No other focal abnormality is noted. 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 No acute abnormality is noted. Electronically Signed   By: Inez Catalina M.D.   On: 08/23/2021 20:38   DG Chest Port 1 View  Result Date: 08/23/2021 CLINICAL DATA:  Recent fall, possible sepsis, initial encounter EXAM: PORTABLE CHEST 1 VIEW COMPARISON:  05/07/2021 FINDINGS: Cardiac shadow is stable. Patient is rotated significantly to the left accentuating the mediastinal markings. The lungs appear clear but hypoinflated.  No acute bony abnormality is noted. IMPRESSION: No acute abnormality noted. Electronically Signed   By: Inez Catalina M.D.   On: 08/23/2021 20:37    EKG: Independently reviewed.  Sinus rhythm, QTC 560.  T wave abnormality in anterior leads similar to prior tracing.  QT interval has increased.  Assessment/Plan Principal Problem:   Severe sepsis (HCC) Active Problems:   Hyponatremia   Anemia   Hypothermia   Hypoxia   Severe sepsis Meets criteria for meets criteria for sepsis with hypothermia, hypotension, and leukocytosis.  Source of infection unknown at this time.  Chest x-ray not suggestive of pneumonia.  COVID and influenza PCR negative. X-ray showing findings consistent with chronic left hip avascular necrosis; no acute abnormality.  No meningeal signs. -Continue broad-spectrum antibiotics including vancomycin, cefepime, and metronidazole.  Hypotension resolved after 3 L fluid boluses.  Bair hugger for hypothermia, monitor temperature closely.  UA and urine culture pending.  Blood culture pending.  Hypothermia Likely due to severe sepsis. -Check TSH and cortisol levels  Acute metabolic encephalopathy Likely due to infection, dehydration, and possible alcohol intoxication.  CT head negative for acute finding.  No focal neurodeficit. -Check TSH, B12, and ammonia levels  Nocturnal hypoxia Not hypoxic when awake but oxygen saturation dropped to 80% when asleep, placed on 2 L supplemental oxygen and stable.  Chest x-ray negative for acute finding.  May have underlying sleep apnea. -Continue supplemental oxygen, will need outpatient sleep study  Anemia Hemoglobin 6.7, baseline in the 8-9 range.  MCV elevated consistent with macrocytosis in the setting of chronic alcohol use.  Patient currently confused, unclear whether he is having any GI bleed symptoms.  However, he is at risk for upper GI bleed given chronic heavy alcohol use. -Type and screen, 2 unit PRBCs ordered in the ED.   Posttransfusion H&H.  Transfuse additional PRBCs if hemoglobin <7.  Start IV Protonix 40 mg every 12 hours.  Check FOBT, if positive, consult GI.  Addendum: Gastritis seen on EGD done August 2022.  Colonoscopy done May 2022 showing internal hemorrhoids.  Mild thrombocytopenia Likely due  to severe sepsis and chronic alcohol use. -Continue to monitor  Hyponatremia Sodium 129.  Likely resulting from chronic alcohol use/poor p.o. intake. -Received IV fluid boluses, repeat labs to check sodium level  Mild elevation of liver enzymes Likely due to severe sepsis and chronic alcohol use.   -Hepatitis panel, repeat LFTs in a.m.  Hypertension -Avoid antihypertensives at this time  Severe alcohol dependence -CIWA protocol; Ativan as needed.  Thiamine, folate, and multivitamin.  QT prolongation -Cardiac monitoring.  Keep potassium above 4 and magnesium above 2.  Avoid QT prolonging drugs if possible.  Repeat EKG in AM.  Tobacco use -NicoDerm patch and counseling  HIV: CD4 count pending. Bipolar disorder, schizoaffective disorder GERD -Pharmacy med rec pending  DVT prophylaxis: SCDs Code Status: Full code Family Communication: No family available at this time. Disposition Plan: Status is: Inpatient  Remains inpatient appropriate because: Severe sepsis  Level of care: Level of care: Stepdown  The medical decision making on this patient was of high complexity and the patient is at high risk for clinical deterioration, therefore this is a level 3 visit.  [name] MD [facility]  If 7PM-7AM, please contact night-coverage [email or web address]  08/24/2021, 2:06 AM

## 2021-08-24 NOTE — Progress Notes (Signed)
Initial Nutrition Assessment  DOCUMENTATION CODES:   Not applicable  INTERVENTION:  - will order 1 packet Juven BID, each packet provides 95 calories, 2.5 grams of protein (collagen), and 9.8 grams of carbohydrate (3 grams sugar); also contains 7 grams of L-arginine and L-glutamine, 300 mg vitamin C, 15 mg vitamin E, 1.2 mcg vitamin B-12, 9.5 mg zinc, 200 mg calcium, and 1.5 g  Calcium Beta-hydroxy-Beta-methylbutyrate to support wound healing.  - will order 30 ml Prosource Plus QID, each supplement provides 100 kcal and 15 grams protein.   - if patient's mentation does not improve and PO intakes remain poor, recommend small bore NGT and initiation of TF given high kcal and protein needs.    NUTRITION DIAGNOSIS:   Increased nutrient needs related to acute illness, wound healing as evidenced by estimated needs.  GOAL:   Patient will meet greater than or equal to 90% of their needs  MONITOR:   PO intake, Supplement acceptance, Labs, Weight trends  REASON FOR ASSESSMENT:   Malnutrition Screening Tool    ASSESSMENT:   35 year old male with medical history of HIV, chronic avascular necrosis L hip, bipolar disorder, depression, PTSD, schizoaffective disorder, seizures, essential HTN, peripheral neuropathy, severe alcohol dependence, tobacco use disorder, and GERD. He presented to the ED via EMS on 11/14 after a mechanical fall with laceration to R eyebrow. In the ED he was somnolent but arousable. He was confused and unable to provide any meaningful information. In the ED, x-ray of L hip showed chronic vascular necrosis, no acute abnormality. CT head and cervical spine was negative for any acute findings. he was admitted with acute metabolic encephalopathy, severe sepsis, and symptomatic anemia.  Patient laying in bed with no visitors present at the time of RD visit. Lunch tray was in front of him and was mainly untouched. No other meal intakes have been documented this admission.    Patient kept eyes closed throughout RD visit and was mainly only able to nod and shake head in response to questions. He did attempt to mouth answers at times but movement of lips was minimal so it was difficult to make out what he was attempting to say.   Visit was brief d/t mentation and patient frequently falling asleep. He indicates that he feels poorly today. He lives alone and PTA he was experiencing increasing weakness and inability to carry out ADL as easily as usual. He reports current back pain which is unusual for him.   Weight yesterday was 216 lb and weight on 05/16/21 was 238 lb. This indicates 22 lb weight loss (9% body weight) in the past 3 months; significant for time frame.   Patient at high risk for malnutrition but unable to identify at this time.   Per notes: - acute metabolic encephalopathy, hepatic encephalopathy - severe sepsis - wounds to buttocks and gluteal area with surrounding cellulitis - symptomatic anemia - transaminitis  - severe alcohol dependence on CIWA protocol   Labs reviewed; Na: 134 mmol/l, Ca: 7.1 mg/dl.  Medications reviewed; 1 mg folvite/day, 30 g lactulose x1 dose 11/15, 1 tablet multivitamin with minerals/day, 40 mg IV protonix BID, 40 mEq Klor-Con x1 dose 11/15, 100 mg thiamine/day.    NUTRITION - FOCUSED PHYSICAL EXAM:  Flowsheet Row Most Recent Value  Orbital Region No depletion  Upper Arm Region No depletion  Thoracic and Lumbar Region Unable to assess  Buccal Region Mild depletion  Temple Region No depletion  Clavicle Bone Region Mild depletion  Clavicle and Acromion Bone  Region No depletion  Scapular Bone Region No depletion  Dorsal Hand No depletion  Patellar Region No depletion  Anterior Thigh Region No depletion  Posterior Calf Region No depletion  Edema (RD Assessment) Severe  [BLE]  Hair Reviewed  Eyes Unable to assess  Mouth Unable to assess  Skin Reviewed  Nails Reviewed       Diet Order:   Diet Order              Diet Heart Room service appropriate? Yes; Fluid consistency: Thin  Diet effective now                   EDUCATION NEEDS:   Not appropriate for education at this time  Skin:  Skin Assessment: Skin Integrity Issues: Skin Integrity Issues:: Other (Comment) Other: laceration to L eyebrow; non-pressure injuries to: L and R great toes, L heel, R foot, R leg, R arm; open ulcer to scrotum; red excoriated skin to bilateral buttocks  Last BM:  PTA/unknown  Height:   Ht Readings from Last 1 Encounters:  08/23/21 6' 3"  (1.905 m)    Weight:   Wt Readings from Last 1 Encounters:  08/23/21 98 kg     Estimated Nutritional Needs:  Kcal:  2600-2850 kcal Protein:  130-145 grams Fluid:  >/= 2.5 L/day     Jarome Matin, MS, RD, LDN, CNSC Inpatient Clinical Dietitian RD pager # available in AMION  After hours/weekend pager # available in [facility]

## 2021-08-24 NOTE — Progress Notes (Signed)
eLink Physician-Brief Progress Note Patient Name: KEYON LILLER DOB: September 17, 1986 MRN: [id number]   Date of Service  08/24/2021  HPI/Events of Note  Patient with soft blood pressures, hemoglobin 6.3 gm / dl. No evidence of active bleeding.  eICU Interventions  Levophed gtt switched to full dose via central line, order given to transfuse one unit PRBC.        [name] Al Bracewell 08/24/2021, 11:15 PM

## 2021-08-24 NOTE — Progress Notes (Signed)
Telephone consent obtained from mother for PICC placement due to altered mental status.

## 2021-08-24 NOTE — Progress Notes (Signed)
Patient arrived to 2W room 33 at 04:45. GCS 15. Vitals stable on room air. Normothermic. [place] J collar on and aligned. Educated patient to keep on until cleared by MD. CHG wipe given. Pt vomited. Contacted MD for possible anti-emetic and pain relief.  Erling Conte. RN

## 2021-08-25 DIAGNOSIS — I959 Hypotension, unspecified: Secondary | ICD-10-CM

## 2021-08-25 DIAGNOSIS — B20 Human immunodeficiency virus [HIV] disease: Secondary | ICD-10-CM

## 2021-08-25 DIAGNOSIS — T68XXXA Hypothermia, initial encounter: Secondary | ICD-10-CM | POA: Diagnosis not present

## 2021-08-25 DIAGNOSIS — A419 Sepsis, unspecified organism: Secondary | ICD-10-CM | POA: Diagnosis not present

## 2021-08-25 DIAGNOSIS — R6520 Severe sepsis without septic shock: Secondary | ICD-10-CM | POA: Diagnosis not present

## 2021-08-25 LAB — BPAM RBC
Blood Product Expiration Date: 202212142359
Blood Product Expiration Date: 202212142359
Blood Product Expiration Date: 202212142359
ISSUE DATE / TIME: 202211142310
ISSUE DATE / TIME: 202211150144
ISSUE DATE / TIME: 202211152338
Unit Type and Rh: 5100
Unit Type and Rh: 5100
Unit Type and Rh: 5100

## 2021-08-25 LAB — COMPREHENSIVE METABOLIC PANEL
ALT: 41 U/L (ref 0–44)
AST: 37 U/L (ref 15–41)
Albumin: 1.6 g/dL — ABNORMAL LOW (ref 3.5–5.0)
Alkaline Phosphatase: 195 U/L — ABNORMAL HIGH (ref 38–126)
Anion gap: 7 (ref 5–15)
BUN: 7 mg/dL (ref 6–20)
CO2: 19 mmol/L — ABNORMAL LOW (ref 22–32)
Calcium: 6.4 mg/dL — CL (ref 8.9–10.3)
Chloride: 108 mmol/L (ref 98–111)
Creatinine, Ser: 1.09 mg/dL (ref 0.61–1.24)
GFR, Estimated: >60 mL/min (ref >60)
Glucose, Bld: 274 mg/dL — ABNORMAL HIGH (ref 70–99)
Potassium: 2.8 mmol/L — ABNORMAL LOW (ref 3.5–5.1)
Sodium: 134 mmol/L — ABNORMAL LOW (ref 135–145)
Total Bilirubin: 1.6 mg/dL — ABNORMAL HIGH (ref 0.3–1.2)
Total Protein: 5.7 g/dL — ABNORMAL LOW (ref 6.5–8.1)

## 2021-08-25 LAB — CBC
HCT: 23.9 % — ABNORMAL LOW (ref 39.0–52.0)
Hemoglobin: 7.9 g/dL — ABNORMAL LOW (ref 13.0–17.0)
MCH: 32.2 pg (ref 26.0–34.0)
MCHC: 33.1 g/dL (ref 30.0–36.0)
MCV: 97.6 fL (ref 80.0–100.0)
Platelets: 94 10*3/uL — ABNORMAL LOW (ref 150–400)
RBC: 2.45 MIL/uL — ABNORMAL LOW (ref 4.22–5.81)
RDW: 19.9 % — ABNORMAL HIGH (ref 11.5–15.5)
WBC: 16.5 10*3/uL — ABNORMAL HIGH (ref 4.0–10.5)
nRBC: 0.1 % (ref 0.0–0.2)

## 2021-08-25 LAB — TYPE AND SCREEN
ABO/RH(D): O POS
Antibody Screen: NEGATIVE
Unit division: 0
Unit division: 0
Unit division: 0

## 2021-08-25 LAB — AMMONIA: Ammonia: 150 umol/L — ABNORMAL HIGH (ref 9–35)

## 2021-08-25 LAB — MAGNESIUM: Magnesium: 1.4 mg/dL — ABNORMAL LOW (ref 1.7–2.4)

## 2021-08-25 LAB — OSMOLALITY: Osmolality: 296 mOsm/kg — ABNORMAL HIGH (ref 275–295)

## 2021-08-25 LAB — GLUCOSE, CAPILLARY
Glucose-Capillary: 123 mg/dL — ABNORMAL HIGH (ref 70–99)
Glucose-Capillary: 106 mg/dL — ABNORMAL HIGH (ref 70–99)

## 2021-08-25 MED ORDER — INSULIN ASPART 100 UNIT/ML IJ SOLN: 0.0000 [IU] | Freq: Every day | INTRAMUSCULAR | Status: DC

## 2021-08-25 MED ORDER — POTASSIUM CHLORIDE CRYS ER 20 MEQ PO TBCR
40.0000 meq | EXTENDED_RELEASE_TABLET | ORAL | Status: AC
  Administered 2021-08-25 (×2): 40 meq via ORAL
  Filled 2021-08-25 (×3): qty 2

## 2021-08-25 MED ORDER — FOOD THICKENER (SIMPLYTHICK): 1.0000 | ORAL | Status: DC | PRN

## 2021-08-25 MED ORDER — CALCIUM GLUCONATE-NACL 2-0.675 GM/100ML-% IV SOLN
2.0000 g | Freq: Once | INTRAVENOUS | Status: AC
  Administered 2021-08-25: 2000 mg via INTRAVENOUS
  Filled 2021-08-25: qty 100

## 2021-08-25 MED ORDER — LACTULOSE 10 GM/15ML PO SOLN
20.0000 g | Freq: Two times a day (BID) | ORAL | Status: DC
  Filled 2021-08-25 (×8): qty 30

## 2021-08-25 MED ORDER — INSULIN ASPART 100 UNIT/ML IJ SOLN
0.0000 [IU] | Freq: Three times a day (TID) | INTRAMUSCULAR | Status: DC
  Administered 2021-08-25 – 2021-08-30 (×2): 2 [IU] via SUBCUTANEOUS

## 2021-08-25 NOTE — Progress Notes (Signed)
NAME:  [name], MRN:  [id number], DOB:  1985-10-17, LOS: 2 ADMISSION DATE:  08/23/2021, CONSULTATION DATE: 11 chest 15/22 REFERRING MD: TRH, CHIEF COMPLAINT: Encephalopathy  History of Present Illness:  35 year old man admitted after fall now concern for septic shock.  Presumed source skin soft tissue given chronic cellulitis/buttock wounds.  Transfer to ICU for persistent hypotension.  Encephalopathic.  Blood pressures have been elevated place but low in the past.  On broad-spectrum biotics.  Review of records shows recent TP-PA positive, intermittent RPR low titer positive.  Neurosyphilis?[name]  Most likely sources lower extremity wounds/chronic cellulitis changes.  Try to place NG tube.  Reportedly tried to hit the nurse.  This was put on hold.  Pertinent  Medical History  Well-controlled HIV  Significant Hospital Events: Including procedures, antibiotic start and stop dates in addition to other pertinent events   11/14 admitted after fall, developed hypotension ongoing issues with buttocks wounds well described [facility]15 persistent hypotension for many hours, eventually transferred to the ICU started on low-dose peripheral vasopressors 11/16 No acute issues overnight, mentation much improved. Remains on levo   Interim History / Subjective:  States he feels well with no acute complaints  Is requesting breakfast   Objective   Blood pressure 117/68, pulse 86, temperature 99 F (37.2 C), temperature source Axillary, resp. rate 15, height 6' 3"  (1.905 m), weight 98 kg, SpO2 96 %.        Intake/Output Summary (Last 24 hours) at 08/25/2021 0819 Last data filed at 08/25/2021 0656 Gross per 24 hour  Intake 3073.54 ml  Output 1700 ml  Net 1373.54 ml    Filed Weights   08/23/21 1923  Weight: 98 kg    Examination: General: Acute on chronic ill appearing adult male lying in bed on mechanical ventilation, in NAD HEENT: [place]/AT, MM pink/moist, PERRL,   Neuro: Alert and oriented x3, some confusion to situation  CV: s1s2 regular rate and rhythm, no murmur, rubs, or gallops,  PULM:  Clear to ascultation bilaterally, congested cough, no increased work of breathing on RA GI: soft, bowel sounds active in all 4 quadrants, non-tender, non-distended Extremities: warm/dry, 3+ pitting lower extremity edema  Skin: no rashes or lesions  Resolved Hospital Problem list     Assessment & Plan:  Septic shock -Presumed, elevated lactate on admission.  Likely component of hypovolemia given poor p.o. intake.  Lactate improved with IV fluids initially.  Presumed source lower extremity wounds. P: Continue to follow pan culture  Consult ID for possible treatment of ?neurosyphilis Continue IV ceftriaxone  Continue pressors for MAP< 65  Lactic 3.5 > 1.9 Procalcitonin 6.15 Monitor urine output Capillary refill  Toxic metabolic encephalopathy -Concern for severe sepsis as primary driver.  Possible hepatic encephalopathy, history of alcohol abuse, elevated LFTs.  No formal diagnosis of cirrhosis.  Mild asterixis on exam.  Unable to take lactulose.  Swung at nurse when trying to place NG tube.  P: Improved am of 11/16 Maintain neuro protective measures; goal for eurothermia, euglycemia, eunatermia, normoxia, and PCO2 goal of 35-40 Nutrition and bowel regiment  Seizure precautions  Aspirations precautions  Lactulose  Continue IV ceftriaxone  HIV -Well-controlled, CD4 count 1100 this admission.  Prior viral load 05/2021 undetectable.  Not checked this admission but given robust CD4 count suspect still undetectable. P: Consult ID difficult to interpret recent RPR + with prior negative, tPA-PA recent positive with prior negative.    Severe protein calorie malnutrition: Present on  admission. Failure to thrive: Unclear trigger, temporal wasting. -Ablumin < 1.5 P: Mentation much improved, advance diet  Protein supplementation   Best Practice (right click  and "Reselect all SmartList Selections" daily)   Diet/type: NPO DVT prophylaxis: LMWH GI prophylaxis: PPI Lines: [name] Foley:  [name] Code Status:  full code Last date of multidisciplinary goals of care discussion [n/a]  Critical care time:    CRITICAL CARE Performed by: Vansh Reckart D. Harris  Total critical care time: 45 minutes  Critical care time was exclusive of separately billable procedures and treating other patients.  Critical care was necessary to treat or prevent imminent or life-threatening deterioration.  Critical care was time spent personally by me on the following activities: development of treatment plan with patient and/or surrogate as well as nursing, discussions with consultants, evaluation of patient's response to treatment, examination of patient, obtaining history from patient or surrogate, ordering and performing treatments and interventions, ordering and review of laboratory studies, ordering and review of radiographic studies, pulse oximetry and re-evaluation of patient's condition.  Dai Mcadams D. Kenton Kingfisher, NP-C [facility] Personal contact information can be found on Amion  08/25/2021, 8:48 AM

## 2021-08-25 NOTE — Evaluation (Signed)
Clinical/Bedside Swallow Evaluation Patient Details  Name: [name] MRN: [id number] Date of Birth: [birth year]  Today's Date: 08/25/2021 Time: SLP Start Time (ACUTE ONLY): 15 SLP Stop Time (ACUTE ONLY): 1138 SLP Time Calculation (min) (ACUTE ONLY): 36 min  Past Medical History:  Past Medical History:  Diagnosis Date   Bipolar 1 disorder ([place])    Depression    Dizziness and giddiness 02/01/2016   Herpes genitalia    HIV disease ([place])    Hypertension    Migraine headache 02/01/2016   Peripheral neuropathy 10/01/2019   PTSD (post-traumatic stress disorder)    Schizoaffective disorder ([place])    Seizures ([place])    Past Surgical History:  Past Surgical History:  Procedure Laterality Date   BACK SURGERY     BIOPSY  02/26/2021   Procedure: BIOPSY;  Surgeon: Otis Brace, MD;  Location: [facility];  Service: Gastroenterology;;   COLONOSCOPY WITH PROPOFOL [name] 02/26/2021   Procedure: COLONOSCOPY WITH PROPOFOL;  Surgeon: Otis Brace, MD;  Location: [facility];  Service: Gastroenterology;  Laterality: [name];   ESOPHAGOGASTRODUODENOSCOPY (EGD) WITH PROPOFOL [name] 02/26/2021   Procedure: ESOPHAGOGASTRODUODENOSCOPY (EGD) WITH PROPOFOL;  Surgeon: Otis Brace, MD;  Location: [facility];  Service: Gastroenterology;  Laterality: [name];   ESOPHAGOGASTRODUODENOSCOPY (EGD) WITH PROPOFOL [name] 05/18/2021   Procedure: ESOPHAGOGASTRODUODENOSCOPY (EGD) WITH PROPOFOL;  Surgeon: Otis Brace, MD;  Location: [facility];  Service: Gastroenterology;  Laterality: [name];   HAND SURGERY     HPI:  35 yo male adm to [facility] after fall, Pt imaging all negative for acute change - including CT neck, CT head, CXR negative.  Swallow evaluation ordered.  Pt has h/o bipolar d/o, dysphagia, FTT, and ETOH abuse.    Swallow evaluation ordered.    Assessment / Plan / Recommendation  Clinical Impression  Pt with known h/o aspiration of thin liquids that was followed by a cough *ineffective. No focal CN  deficits apparent but cough is weak and congested and voice is hoarse.  Pt admits to coughing/choking on an orange prior to admission.  Overt coughing noted post-swallow of thin liquid x2/5 concerning for aspiration.  This continued even with chin tuck posture - No indication of aspiraiton with icee, ice, nectar, and solid.  Pt is impulsive, eats at rapid rate and suspect this is his source of aspiration on orange PTA. Pt agreeable to diet modification short term while in hospital  Will follow up for dietary advancement.  Recommend nectar short term to compensate due to pt's respiratory issues/acute illness/weakness. SLP Visit Diagnosis: Dysphagia, pharyngeal phase (R13.13)    Aspiration Risk  Moderate aspiration risk    Diet Recommendation Dysphagia 3 (Mech soft);Nectar-thick liquid (thin water between meals after oral care)   Medication Administration: Whole meds with puree Supervision: Full supervision/cueing for compensatory strategies (d/t impulsivity) Compensations: Slow rate;Small sips/bites Postural Changes: Seated upright at 90 degrees;Remain upright for at least 30 minutes after po intake    Other  Recommendations Oral Care Recommendations: Oral care BID    Recommendations for follow up therapy are one component of a multi-disciplinary discharge planning process, led by the attending physician.  Recommendations may be updated based on patient status, additional functional criteria and insurance authorization.  Follow up Recommendations No SLP follow up      Assistance Recommended at Discharge None  Functional Status Assessment Patient has had a recent decline in their functional status and demonstrates the ability to make significant improvements in function in a reasonable and predictable amount of time.  Frequency and Duration  min 1 x/week  1 week       Prognosis Prognosis for Safe Diet Advancement: Good      Swallow Study   General Date of Onset: 08/25/21 HPI: 35 yo  male adm to [facility] after fall, Pt imaging all negative for acute change - including CT neck, CT head, CXR negative.  Swallow evaluation ordered.  Pt has h/o bipolar d/o, dysphagia, FTT, and ETOH abuse.    Swallow evaluation ordered. Type of Study: Bedside Swallow Evaluation Diet Prior to this Study: Regular;Thin liquids Temperature Spikes Noted: No Respiratory Status: Nasal cannula History of Recent Intubation: No Behavior/Cognition: Alert;Cooperative;Pleasant mood Oral Cavity Assessment: Within Functional Limits Oral Care Completed by SLP: No Oral Cavity - Dentition: Adequate natural dentition Vision: Functional for self-feeding Self-Feeding Abilities: Able to feed self Patient Positioning: Upright in bed Baseline Vocal Quality: Hoarse Volitional Cough: Strong;Congested Volitional Swallow: Able to elicit    Oral/Motor/Sensory Function Overall Oral Motor/Sensory Function: Within functional limits   Ice Chips Ice chips: Within functional limits Presentation: Spoon Other Comments: icee   Thin Liquid Thin Liquid: Impaired Presentation: Cup;Self Fed;Straw Other Comments: immediate cough with 2/5 swallows, concerning for aspiration    Nectar Thick Nectar Thick Liquid: Not tested   Honey Thick Honey Thick Liquid: Not tested   Puree Puree: Within functional limits Presentation: Self Fed;Spoon   Solid     Solid: Within functional limits Presentation: Spoon;Self Fed      [name] 08/25/2021,11:57 AM  Kathleen Lime, MS [place] Office [phone] Pager [phone]

## 2021-08-25 NOTE — TOC Progression Note (Signed)
Transition of Care [facility]) - Progression Note    Patient Details  Name: [name] MRN: [id number] Date of Birth: 1986/05/23  [facility]) CM/SW Contact  Ben Sanz, Juliann Pulse, RN Phone Number: 08/25/2021, 10:08 AM  Clinical Narrative:  [facility] rep Levada Dy following for HHRN-wound care-will need specific wound care orders.     Expected Discharge Plan: [place] Barriers to Discharge: Continued Medical Work up  Expected Discharge Plan and Services Expected Discharge Plan: [place]   Discharge Planning Services: CM Consult Post Acute Care Choice: Resumption of Svcs/PTA Provider (private duty aide) Living arrangements for the past 2 months: Apartment                                       Social Determinants of Health (SDOH) Interventions    Readmission Risk Interventions Readmission Risk Prevention Plan 08/24/2021 08/24/2021 11/01/2019  Transportation Screening Complete - -  PCP or Specialist Appt within 3-5 Days Complete Complete -  Home Care Screening - - Complete  Medication Review (RN CM) - - Complete  HRI or Home Care Consult Complete Complete -  Social Work Consult for [place] Planning/Counseling - Complete -  Palliative Care Screening Complete Complete -  Medication Review Press photographer) Complete Complete -  Some recent data might be hidden

## 2021-08-25 NOTE — Consult Note (Signed)
[facility] for Infectious Disease    Date of Admission:  08/23/2021     Reason for Consult: positive syphilis serology    Referring Provider: H[place]ucker    Lines:  peripheral  Abx: 11/14-c ceftriaxone 11/14-c metronidazole 11/14-c vancomycin   Home biktarvy        Assessment: Hiv Hypothermia/hypotension Alcoholism Hypocortisolism Chronic sacral/buttock soft tissue wound  35  yo male well controlled hiv (on biktarvy; followed by [facility]; cd4 1100 this admission, hiv vl undetectable 06/2021), left hip avascular necrosis, bipolar, schizoaffective disorder, alcoholism, chronic/recurrent buttock wound, admitted after a mechanical fall and right eyebrow laceration, found to be hypothermic in ED 92's and hypotensive sbp 80s with relative bi-cytopenia (hb/platelet), confusion, and also positive syphilis serology which ID is consulted   #hiv Well controlled as mentioned  -Continue biktarvy -he can follow up with his [facility] ID team after discharge   #positive syphilis serology Distant treatment; called department of health Rpr titer has been low/stable Agree with team mild confusion multifactorial/metabolic. Unlikely to be syphilitic meningoencephalitis  -no indication for syphilis tx   #hypothermia #hypotension #severe malnutrition #alcoholism -- concern for advanced liver disease #bilateral brawny edema #bicytopenia -- no gib Even though procalcitonin elevated, I do not suspect infectious etiology playing a role His hypotension/hypothermia likely alcoholism/exposure and nutritional related and ?adrenal insufficiency. See concern of cirrhosis below. Tsh normal Albumin extremely low (malnutrition/cirrhosis?), difficult to interpret a cortisol level of 13. Primary team will investigate futher Probably would benefit from multiple minerals/vitamin replacement. Bicytopenia likely nutritional/alcohol -- I do not suspect sepsis (blood smear unremarkable).  Of note, it is observed that b12 can falsely high near end-stage liver disease Last echo several weeks ago showed normal ef; no obvious mention on echo to suggest high output heart failure although he is at risk for in setting alcoholism  -continue supportive care per primary team -if blood culture tomorrow remains negative, will dc ceftriaxone/flagyl -dc vancomycin today as no evidence mrsa infection -perhaps could benefit from further GI evaluation for cirrhosis -will see again tomorrow   #chronic buttock wound #?hidadrenitis Exam also with scarring/nodular process along armpits, groins Patient is a smoker This suggest hidradenitis He has been on a few courses of amox-clav/unasyn for buttock process which appears chronic and unchanged Suspect hidradenitis  -if bcx remains negative tomorrow stop ceftriaxone/flagyl as mentioned, and start doxycycline -would benefit from outpatient dermatology evaluation for hidradenitis        ------------------------------------------------ Principal Problem:   Severe sepsis ([place]) Active Problems:   Hyponatremia   Anemia   Hypothermia   Hypoxia    HPI: [name] is a 35 y.o. male alcoholism, well controlled hiv on biktarvy (followed by [facility]), bipolar/schizoaffective, alcoholism admitted after a glf in setting hypothermia/slight confusion, found to have bicytopenia, positive syphilis serology, hypotension, relative low cortisol level, and severe malnutrition  Patient has frequent ed visit/admissions to our [facility] system, recently for cellulitis/buttock wound discharged on amox-clav  He is well controlled on hiv with biktarvy and follows [facility]  There is a question of advance liver disease previously. This admission and as before albumin <1.5, brawny edema, cytopenia, relative hypotension, extremely high b12. He consumes large amount of alcohol as indicated from prior consult note although not revealing that today. His  last drink 2 days prior to admission; serum etoh negative on admission. No sign of withdrawal  The reason id was called was specificallly for rpr/tppa-fta positive. Titer has been  1:1 or low. Previously treated per dhs discussion; highest titer prior to treatment 1:64; treated twice, last one 2009  For his hypotension/hypothermia, septic w/u so far negative blood cx, cxr (he has some dry cough on exam/interview but no hypoxia/dyspnea), no focal pain, chronic buttock wound. Started on vanc/ceftriaxone/flagyl. Temperature had improved. Tsh normal. Cortisol low in setting albumin < 1.5  Ros limited with his improving but still slight confusion    Family History  Problem Relation Age of Onset   Alcohol abuse Mother    Schizophrenia Father    Depression Father    Alcohol abuse Father    Alcohol abuse Paternal Uncle    Alcohol abuse Paternal Uncle     Social History   Tobacco Use   Smoking status: Every Day    Packs/day: 1.00    Types: Cigarettes   Smokeless tobacco: Never  Vaping Use   Vaping Use: Never used  Substance Use Topics   Alcohol use: Yes    Alcohol/week: 14.0 standard drinks    Types: 14 Cans of beer per week    Comment: 5-6 40oz per day   Drug use: No    Allergies  Allergen Reactions   Dapsone Other (See Comments)    Per centricity "G6PD deficient"   Primaquine Phosphate Other (See Comments)    Per Centricity "G6PD deficient"    Review of Systems: ROS All Other ROS was negative, except mentioned above   Past Medical History:  Diagnosis Date   Bipolar 1 disorder ([place])    Depression    Dizziness and giddiness 02/01/2016   Herpes genitalia    HIV disease ([place])    Hypertension    Migraine headache 02/01/2016   Peripheral neuropathy 10/01/2019   PTSD (post-traumatic stress disorder)    Schizoaffective disorder (HCC)    Seizures (H[facility])        Scheduled Meds:  (feeding supplement) PROSource Plus  30 mL Oral QID   bictegravir-emtricitabine-tenofovir  AF  1 tablet Oral Daily   Chlorhexidine Gluconate Cloth  6 each Topical Daily   Chlorhexidine Gluconate Cloth  6 each Topical Q0600   enoxaparin (LOVENOX) injection  40 mg Subcutaneous E70J   folic acid  1 mg Oral Daily   lactulose  30 g Oral Once   lactulose  300 mL Rectal Once   multivitamin with minerals  1 tablet Oral Daily   mupirocin ointment  1 application Nasal BID   nicotine  14 mg Transdermal Daily   nutrition supplement (JUVEN)  1 packet Oral BID BM   pantoprazole (PROTONIX) IV  40 mg Intravenous Q12H   pneumococcal 23 valent vaccine  0.5 mL Intramuscular Tomorrow-1000   potassium chloride  40 mEq Oral Once   sodium chloride flush  10-40 mL Intracatheter Q12H   thiamine  100 mg Oral Daily   Or   thiamine  100 mg Intravenous Daily   valACYclovir  1,000 mg Oral Daily   Continuous Infusions:  sodium chloride     sodium chloride 10 mL/hr at 08/25/21 0656   cefTRIAXone (ROCEPHIN)  IV Stopped (08/24/21 2300)   metronidazole Stopped (08/25/21 0100)   norepinephrine (LEVOPHED) Adult infusion 9 mcg/min (08/25/21 0823)   PRN Meds:.food thickener, LORazepam **OR** LORazepam, oxyCODONE, scopolamine, sodium chloride flush   OBJECTIVE: Blood pressure 117/68, pulse 86, temperature 98.2 F (36.8 C), temperature source Oral, resp. rate 15, height 6' 3"  (1.905 m), weight 98 kg, SpO2 96 %.  Physical Exam General/constitutional: no distress, pleasant, conversant, appropriate; dry cough at  times, slight disorientation HEENT: Normocephalic, PER, Conj Clear, EOMI, Oropharynx clear Neck supple CV: rrr no mrg Lungs: clear to auscultation, normal respiratory effort Abd: Soft, Nontender -- periumbillical hernia nonstrangulated Ext: bilateral brawny edema Skin: reviewed buttock picture from 11/14; no cellulitic change on exam or purulence out of shallow open wound stage2-3; nodular and hypotrophic scarring bilateral axillary line/bilateral groin and buttock Neuro: generalized weakness but  4-5/5 strength symmetric MSK: no peripheral joint swelling/tenderness/warmth; back spines nontender    Lab Results Lab Results  Component Value Date   WBC 15.5 (H) 08/24/2021   HGB 6.3 (LL) 08/24/2021   HCT 19.9 (L) 08/24/2021   MCV 100.4 (H) 08/24/2021   PLT 98 (L) 08/24/2021    Lab Results  Component Value Date   CREATININE 1.20 08/24/2021   BUN 9 08/24/2021   NA 136 08/24/2021   K 4.2 08/24/2021   CL 111 08/24/2021   CO2 21 (L) 08/24/2021    Lab Results  Component Value Date   ALT 54 (H) 08/24/2021   AST 48 (H) 08/24/2021   ALKPHOS 233 (H) 08/24/2021   BILITOT 1.4 (H) 08/24/2021      Microbiology: Recent Results (from the past 240 hour(s))  Blood Culture (routine x 2)     Status: None (Preliminary result)   Collection Time: 08/23/21  7:40 PM   Specimen: BLOOD  Result Value Ref Range Status   Specimen Description   Final    BLOOD RIGHT ANTECUBITAL Performed at [facility], [place] [address] [place], [place] [address]    Special Requests   Final    BOTTLES DRAWN AEROBIC AND ANAEROBIC Blood Culture results may not be optimal due to an inadequate volume of blood received in culture bottles Performed at [facility] [address] [place], [place] [address]    Culture   Final    NO GROWTH 1 DAY Performed at [facility], [place] [address], [place], [place] [address]    Report Status PENDING  Incomplete  Resp Panel by RT-PCR (Flu A&B, Covid) Nasopharyngeal Swab     Status: None   Collection Time: 08/23/21  9:00 PM   Specimen: Nasopharyngeal Swab; Nasopharyngeal(NP) swabs in vial transport medium  Result Value Ref Range Status   SARS Coronavirus 2 by RT PCR NEGATIVE NEGATIVE Final    Comment: (NOTE) SARS-CoV-2 target nucleic acids are NOT DETECTED.  The SARS-CoV-2 RNA is generally detectable in upper respiratory specimens during the acute phase of infection. The lowest concentration of SARS-CoV-2 viral copies this  assay can detect is 138 copies/mL. A negative result does not preclude SARS-Cov-2 infection and should not be used as the sole basis for treatment or other patient management decisions. A negative result may occur with  improper specimen collection/handling, submission of specimen other than nasopharyngeal swab, presence of viral mutation(s) within the areas targeted by this assay, and inadequate number of viral copies(<138 copies/mL). A negative result must be combined with clinical observations, patient history, and epidemiological information. The expected result is Negative.  Fact Sheet for Patients:  EntrepreneurPulse.com.au  Fact Sheet for Healthcare Providers:  IncredibleEmployment.be  This test is no t yet approved or cleared by the [place] FDA and  has been authorized for detection and/or diagnosis of SARS-CoV-2 by FDA under an Emergency Use Authorization (EUA). This EUA will remain  in effect (meaning this test can be used) for the duration of the COVID-19 declaration under Section 564(b)(1) of the Act, 21 U.S.C.section 360bbb-3(b)(1), unless  the authorization is terminated  or revoked sooner.       Influenza A by PCR NEGATIVE NEGATIVE Final   Influenza B by PCR NEGATIVE NEGATIVE Final    Comment: (NOTE) The Xpert Xpress SARS-CoV-2/FLU/RSV plus assay is intended as an aid in the diagnosis of influenza from Nasopharyngeal swab specimens and should not be used as a sole basis for treatment. Nasal washings and aspirates are unacceptable for Xpert Xpress SARS-CoV-2/FLU/RSV testing.  Fact Sheet for Patients: EntrepreneurPulse.com.au  Fact Sheet for Healthcare Providers: IncredibleEmployment.be  This test is not yet approved or cleared by the [place] FDA and has been authorized for detection and/or diagnosis of SARS-CoV-2 by FDA under an Emergency Use Authorization (EUA). This EUA will  remain in effect (meaning this test can be used) for the duration of the COVID-19 declaration under Section 564(b)(1) of the Act, 21 U.S.C. section 360bbb-3(b)(1), unless the authorization is terminated or revoked.  Performed at [facility], [place] [address] [place], [place] [address]   MRSA Next Gen by PCR, Nasal     Status: Abnormal   Collection Time: 08/24/21  5:01 AM   Specimen: Nasal Mucosa; Nasal Swab  Result Value Ref Range Status   MRSA by PCR Next Gen DETECTED (A) NOT DETECTED Final    Comment: RESULT CALLED TO, READ BACK BY AND VERIFIED WITH: BULL, E. RN ON 08/24/2021 @ 6979 BY MECIAL J.  (NOTE) The GeneXpert MRSA Assay (FDA approved for NASAL specimens only), is one component of a comprehensive MRSA colonization surveillance program. It is not intended to diagnose MRSA infection nor to guide or monitor treatment for MRSA infections. Test performance is not FDA approved in patients less than 24 years old. Performed at [facility], [place] [address] [place], [place] [address]   Culture, blood (Routine X 2) w Reflex to ID Panel     Status: None (Preliminary result)   Collection Time: 08/24/21  8:11 AM   Specimen: BLOOD LEFT HAND  Result Value Ref Range Status   Specimen Description   Final    BLOOD LEFT HAND Performed at [facility] [address], [place], [place] [address]    Special Requests   Final    BOTTLES DRAWN AEROBIC ONLY Blood Culture adequate volume Performed at [facility] [address], [place], [place] [address]    Culture   Final    NO GROWTH 1 DAY Performed at [facility], [place] [address], [place], [place] [address]    Report Status PENDING  Incomplete     Serology: Lab Results  Component Value Date   CD4TCELL 19 (H) 08/23/2021   CD4TABS 1,169 08/23/2021   Lab Results  Component Value Date   HIV1RNAQUANT <20 05/11/2021      Imaging: If present, new  imagings (plain films, ct scans, and mri) have been personally visualized and interpreted; radiology reports have been reviewed. Decision making incorporated into the Impression / Recommendations.   11/14 cxr  No acute pathology  11/14 pelvis xray Chronic changes of avascular necrosis and partial collapse of the left femoral head similar to that seen on prior CT. No acute abnormality is noted.  11/14 ct spine/head No acute pathology  Jabier Mutton, [facility] [place] [phone] pager    08/25/2021, 11:33 AM

## 2021-08-25 NOTE — Progress Notes (Signed)
Remains on levo per chart review, discussed with PCCM, they'll take over.  Will sign off, please let us know when we can be of further assistance.  TRH.

## 2021-08-26 DIAGNOSIS — A419 Sepsis, unspecified organism: Secondary | ICD-10-CM | POA: Diagnosis not present

## 2021-08-26 DIAGNOSIS — R6520 Severe sepsis without septic shock: Secondary | ICD-10-CM | POA: Diagnosis not present

## 2021-08-26 LAB — BASIC METABOLIC PANEL
Anion gap: 5 (ref 5–15)
BUN: 8 mg/dL (ref 6–20)
CO2: 23 mmol/L (ref 22–32)
Calcium: 7.6 mg/dL — ABNORMAL LOW (ref 8.9–10.3)
Chloride: 109 mmol/L (ref 98–111)
Creatinine, Ser: 1 mg/dL (ref 0.61–1.24)
GFR, Estimated: >60 mL/min (ref >60)
Glucose, Bld: 164 mg/dL — ABNORMAL HIGH (ref 70–99)
Potassium: 3.5 mmol/L (ref 3.5–5.1)
Sodium: 137 mmol/L (ref 135–145)

## 2021-08-26 LAB — CBC
HCT: 25.5 % — ABNORMAL LOW (ref 39.0–52.0)
Hemoglobin: 8.3 g/dL — ABNORMAL LOW (ref 13.0–17.0)
MCH: 32.3 pg (ref 26.0–34.0)
MCHC: 32.5 g/dL (ref 30.0–36.0)
MCV: 99.2 fL (ref 80.0–100.0)
Platelets: 80 10*3/uL — ABNORMAL LOW (ref 150–400)
RBC: 2.57 MIL/uL — ABNORMAL LOW (ref 4.22–5.81)
RDW: 20.8 % — ABNORMAL HIGH (ref 11.5–15.5)
WBC: 14.7 10*3/uL — ABNORMAL HIGH (ref 4.0–10.5)
nRBC: 0 % (ref 0.0–0.2)

## 2021-08-26 LAB — GLUCOSE, CAPILLARY
Glucose-Capillary: 109 mg/dL — ABNORMAL HIGH (ref 70–99)
Glucose-Capillary: 92 mg/dL (ref 70–99)
Glucose-Capillary: 102 mg/dL — ABNORMAL HIGH (ref 70–99)
Glucose-Capillary: 137 mg/dL — ABNORMAL HIGH (ref 70–99)

## 2021-08-26 LAB — ACTH STIMULATION, 3 TIME POINTS
Cortisol, 30 Min: 15.6 ug/dL
Cortisol, 60 Min: 17.9 ug/dL
Cortisol, Base: 11.6 ug/dL

## 2021-08-26 MED ORDER — MEGESTROL ACETATE 400 MG/10ML PO SUSP
400.0000 mg | Freq: Every day | ORAL | Status: DC
  Administered 2021-08-26 – 2021-08-31 (×6): 400 mg via ORAL
  Filled 2021-08-26 (×7): qty 10

## 2021-08-26 MED ORDER — COSYNTROPIN 0.25 MG IJ SOLR
0.2500 mg | Freq: Once | INTRAMUSCULAR | Status: AC
  Administered 2021-08-26: 15:00:00 0.25 mg via INTRAVENOUS
  Filled 2021-08-26: qty 0.25

## 2021-08-26 MED ORDER — ONDANSETRON HCL 4 MG/2ML IJ SOLN
4.0000 mg | Freq: Once | INTRAMUSCULAR | Status: AC
  Administered 2021-08-26: 11:00:00 4 mg via INTRAVENOUS
  Filled 2021-08-26: qty 2

## 2021-08-26 MED ORDER — MIDODRINE HCL 5 MG PO TABS
10.0000 mg | ORAL_TABLET | Freq: Three times a day (TID) | ORAL | Status: DC
  Administered 2021-08-26 – 2021-08-31 (×17): 10 mg via ORAL
  Filled 2021-08-26 (×17): qty 2

## 2021-08-26 MED ORDER — CYCLOBENZAPRINE HCL 5 MG PO TABS
5.0000 mg | ORAL_TABLET | Freq: Three times a day (TID) | ORAL | Status: AC | PRN
  Administered 2021-08-26: 23:00:00 5 mg via ORAL
  Filled 2021-08-26: qty 1

## 2021-08-26 NOTE — Progress Notes (Signed)
[facility] Progress Note Patient Name: [name] DOB: 1986/06/04 MRN: [id number]   Date of Service  08/26/2021  HPI/Events of Note  Patient requesting that his home Flexeril be resumed.  eICU Interventions  Flexeril ordered.        [name] [name] 08/26/2021, 10:36 PM

## 2021-08-26 NOTE — Progress Notes (Signed)
Id brief note  Chart reviewed [place]/clinically improving Bcx negative    -stop iv ceftriaxone/flagyl -start doxy -please have patient f/u with his hiv team for ongoing hiv care -would consider referring him to outpatient dermatology for what seems like hidradenitis -consider a long 2-3 month course of doxy pending dermatology referal   Will sign off. Discussed with primary team

## 2021-08-26 NOTE — Progress Notes (Signed)
NUTRITION NOTE  Able to talk with Loree Fee, CCM NP, about patient. She was able to talk with patient about recommendation for small bore NGT. Patient is not agreeable at this time.  Plan for Calorie Count and for initiation of Megace.    RD will follow-up 11/17 for day #1 of Calorie Count results.       Jarome Matin, MS, RD, LDN, CNSC Inpatient Clinical Dietitian RD pager # available in [facility]  After hours/weekend pager # available in [facility]

## 2021-08-26 NOTE — Progress Notes (Signed)
Nutrition Follow-up  DOCUMENTATION CODES:   Not applicable  INTERVENTION:  - continue Juven BID and 30 ml Prosource Plus QID. - recommend small bore NGT with nocturnal TF to aid in meeting estimated needs.   - nocturnal TF recommendation: Pivot 1.5 @ 25 ml/hr to advance by 10 ml every 8 hours of run time to reach goal rate of 85 ml/hr x14 hours/day (1800-0800).  - at goal rate, this regimen will provide 1785 kcal (69% kcal need), 112 grams protein (80% protein need), and 903 ml free water.   Monitor magnesium, potassium, and phosphorus BID for at least 3 days, MD to replete as needed, as pt is at risk for refeeding syndrome given severe alcohol abuse PTA and patient's report of ongoing poor PO intakes for unknown amount of time.    NUTRITION DIAGNOSIS:   Increased nutrient needs related to acute illness, wound healing as evidenced by estimated needs. -ongoing  GOAL:   Patient will meet greater than or equal to 90% of their needs -unmet, unlikely to meet with PO intakes alone  MONITOR:   PO intake, Supplement acceptance, Labs, Weight trends  REASON FOR ASSESSMENT:   Consult Assessment of nutrition requirement/status  ASSESSMENT:   35 year old male with medical history of HIV, chronic avascular necrosis L hip, bipolar disorder, depression, PTSD, schizoaffective disorder, seizures, essential HTN, peripheral neuropathy, severe alcohol dependence, tobacco use disorder, and GERD. He presented to the ED via EMS on 11/14 after a mechanical fall with laceration to R eyebrow. In the ED he was somnolent but arousable. He was confused and unable to provide any meaningful information. In the ED, x-ray of L hip showed chronic vascular necrosis, no acute abnormality. CT head and cervical spine was negative for any acute findings. he was admitted with acute metabolic encephalopathy, severe sepsis, and symptomatic anemia.  SLP following patient and last saw him this AM; reviewed note. Diet  changed from Heart Healthy, thin liquids to Dysphagia 3, nectar-thick liquids yesterday at 1128. No meal intakes documented since that time.   Patient sitting upright in bed with lunch tray in front of patient. RN at bedside and reports patient told her he is not hungry and wants to wait until later to eat lunch. Patient needs full supervision when eating so RN moved tray away from patient.   Patient with high estimated needs d/t wounds and medical course. Able to talk with CCM MD to recommend small bore NGT placement for nocturnal TF to aid in meeting needs and allowing patient to eat during the day.    Weight today is +22 lb compared to weight on 11/14 and is consistent with weight on 05/16/21.  Patient denies abdominal pain or nausea. Shares that appetite is improved since admission and that PTA he was nibbling during the day when he was hungry. This was ongoing for a prolonged period, per his report, but he was unable to recall how long.   He admits to drinking alcohol at home without issue. He drinks beer. After sharing this patient says "I am done answering questions".   No further information able to be obtained at this time.    Labs reviewed; CBGs: 109 and 92 mg/dl, Ca: 7.6 mg/dl, alk phos elevated but trending down, ammonia: 150 umol/l.  Medications reviewed; 1 mg folvite/day, sliding scale novolog, 20 g lactulose BID, 1 tablet multivitamin with minerals/day, 40 mg IV protonix BID, 40 mEq Klor-Con x2 doses 11/16, 100 mg thiamine/day.    Diet Order:   Diet Order  DIET DYS 3 Room service appropriate? Yes; Fluid consistency: Nectar Thick  Diet effective now                   EDUCATION NEEDS:   Not appropriate for education at this time  Skin:  Skin Assessment: Skin Integrity Issues: Skin Integrity Issues:: Other (Comment) Other: laceration to L eyebrow; non-pressure injuries to: L and R great toes, L heel, R foot, R leg, R arm; open ulcer to scrotum; red  excoriated skin to bilateral buttocks  Last BM:  PTA/unknown  Height:   Ht Readings from Last 1 Encounters:  08/23/21 6' 3"  (1.905 m)    Weight:   Wt Readings from Last 1 Encounters:  08/26/21 108 kg     Estimated Nutritional Needs:  Kcal:  2600-2850 kcal Protein: 145-160 grams Fluid:  >/= 2.5 L/day     Drew Matin, MS, RD, LDN, CNSC Inpatient Clinical Dietitian RD pager # available in [facility]  After hours/weekend pager # available in [facility]
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
## 2021-08-26 NOTE — Progress Notes (Signed)
PCCM Progress Note  Patients ongoing nutritional need and deficits discussed with RD and recommendations made for nocturnal feeds via a small bore NG tube.   This plan was discussed with the patient and unfortunately he is not currently willing to proceed with the feeding tube placement. He wishes to continue to try and consume the needed calories. The risk of continues malnutrition was discussed extensively and patient verbalized understanding.   At this time we will proceed with a strict calorie count and a trial of adding an appetite stimulant to try and increase caloric intake.   Gavinn Collard D. Kenton Kingfisher, NP-C [facility] Personal contact information can be found on Amion  08/26/2021, 1:36 PM

## 2021-08-26 NOTE — Progress Notes (Signed)
Speech Language Pathology Treatment: Dysphagia  Patient Details Name: [name] MRN: [id number] DOB: 10/18/1985 Today's Date: 08/26/2021 Time: 0926-1006 SLP Time Calculation (min) (ACUTE ONLY): 40 min  Assessment / Plan / Recommendation Clinical Impression  Pt seen today to address dysphagia goals - assess po tolerance.  He was located in bed, reclined to approximately 35* and admits to eating in this position.   Per pt's RN, he vomited with breakfast - pt reported sensing eggs sticking in "esophagus" but pointing to proximal pharynx *vallecular region*.  SlP reviewed prior MBS with pt that showed decreased oral control with premature spillage into pharynx to pyriform with resultant trace aspiration to encourage pt to sit FULLY upright and take small boluses/sips.    Obtained assist to slide pt up in bed, after which pt vomited - white frothy secretions initially - then ? Yellowish secretions- ? bile.  He then admitted to nausea- and SLP used call bell to alert RN that pt wanted medication for nausea.  having nausea- vomited yellowish secretions - ? bile?   In addition, he also reports no bowel movement in a week - but does not want medication to assist with constipation. Alerted nurse to both findings.    Pt wanted to consume thin water and was observed to consume 3 straw boluses in fully upright position with NO indication of aspiration.   Recommend continue diet with strict precautions.    Using teach back - pt educated to findings, recommendations - most notably to importance of sitting upright and if coughing - aspiration results in overt coughing.      HPI HPI: 35 yo male adm to [facility] after fall, Pt imaging all negative for acute change - including CT neck, CT head, CXR negative.  Swallow evaluation ordered.  Pt has h/o bipolar d/o, dysphagia, FTT, and ETOH abuse.    Swallow evaluation ordered.      SLP Plan  Continue with current plan of care      Recommendations for follow up  therapy are one component of a multi-disciplinary discharge planning process, led by the attending physician.  Recommendations may be updated based on patient status, additional functional criteria and insurance authorization.    Recommendations  Diet recommendations: Dysphagia 3 (mechanical soft);Nectar-thick liquid (water between meals after oral care) Liquids provided via: Straw Medication Administration: Whole meds with puree Compensations: Slow rate;Small sips/bites Postural Changes and/or Swallow Maneuvers: Seated upright 90 degrees;Upright 30-60 min after meal                Oral Care Recommendations: Oral care BID Follow Up Recommendations: No SLP follow up Assistance recommended at discharge: None SLP Visit Diagnosis: Dysphagia, pharyngeal phase (R13.13) Plan: Continue with current plan of care       GO               Kathleen Lime, MS [place] Office [phone] Pager [phone]  [name]  08/26/2021, 10:17 AM

## 2021-08-27 DIAGNOSIS — A419 Sepsis, unspecified organism: Secondary | ICD-10-CM | POA: Diagnosis not present

## 2021-08-27 DIAGNOSIS — R6520 Severe sepsis without septic shock: Secondary | ICD-10-CM | POA: Diagnosis not present

## 2021-08-27 LAB — GLUCOSE, CAPILLARY
Glucose-Capillary: 104 mg/dL — ABNORMAL HIGH (ref 70–99)
Glucose-Capillary: 104 mg/dL — ABNORMAL HIGH (ref 70–99)
Glucose-Capillary: 100 mg/dL — ABNORMAL HIGH (ref 70–99)
Glucose-Capillary: 88 mg/dL (ref 70–99)

## 2021-08-27 LAB — BASIC METABOLIC PANEL
Anion gap: 5 (ref 5–15)
BUN: 8 mg/dL (ref 6–20)
CO2: 25 mmol/L (ref 22–32)
Calcium: 7.4 mg/dL — ABNORMAL LOW (ref 8.9–10.3)
Chloride: 106 mmol/L (ref 98–111)
Creatinine, Ser: 0.94 mg/dL (ref 0.61–1.24)
GFR, Estimated: >60 mL/min (ref >60)
Glucose, Bld: 164 mg/dL — ABNORMAL HIGH (ref 70–99)
Potassium: 3.1 mmol/L — ABNORMAL LOW (ref 3.5–5.1)
Sodium: 136 mmol/L (ref 135–145)

## 2021-08-27 LAB — OCCULT BLOOD X 1 CARD TO LAB, STOOL: Fecal Occult Bld: POSITIVE — AB

## 2021-08-27 LAB — PHOSPHORUS: Phosphorus: 1.5 mg/dL — ABNORMAL LOW (ref 2.5–4.6)

## 2021-08-27 LAB — MAGNESIUM: Magnesium: 1.2 mg/dL — ABNORMAL LOW (ref 1.7–2.4)

## 2021-08-27 LAB — HEMOGLOBIN A1C
Hgb A1c MFr Bld: 5 % (ref 4.8–5.6)
Mean Plasma Glucose: 97 mg/dL

## 2021-08-27 LAB — AMMONIA: Ammonia: 90 umol/L — ABNORMAL HIGH (ref 9–35)

## 2021-08-27 MED ORDER — POTASSIUM PHOSPHATES 15 MMOLE/5ML IV SOLN
15.0000 mmol | Freq: Once | INTRAVENOUS | Status: AC
  Administered 2021-08-27: 15 mmol via INTRAVENOUS
  Filled 2021-08-27: qty 5

## 2021-08-27 MED ORDER — MAGNESIUM SULFATE 2 GM/50ML IV SOLN
2.0000 g | Freq: Once | INTRAVENOUS | Status: AC
  Administered 2021-08-27: 2 g via INTRAVENOUS
  Filled 2021-08-27: qty 50

## 2021-08-27 MED ORDER — CHLORHEXIDINE GLUCONATE CLOTH 2 % EX PADS
6.0000 | MEDICATED_PAD | Freq: Every day | CUTANEOUS | Status: DC
  Administered 2021-08-30 – 2021-08-31 (×2): 6 via TOPICAL

## 2021-08-27 NOTE — Progress Notes (Signed)
Latest Reference Range & Units 08/27/21 60:47  BASIC METABOLIC PANEL  Rpt !  Sodium 135 - 145 mmol/L 136  Potassium 3.5 - 5.1 mmol/L 3.1 (L)  Chloride 98 - 111 mmol/L 106  CO2 22 - 32 mmol/L 25  Glucose 70 - 99 mg/dL 164 (H)  BUN 6 - 20 mg/dL 8  Creatinine 0.61 - 1.24 mg/dL 0.94  Calcium 8.9 - 10.3 mg/dL 7.4 (L)  Anion gap 5 - 15  5  Phosphorus 2.5 - 4.6 mg/dL 1.5 (L)  Magnesium 1.7 - 2.4 mg/dL 1.2 (L)  Ammonia 9 - 35 umol/L 90 (H)  GFR, Estimated >60 mL/min >60  WBC 4.0 - 10.5 K/uL 13.5 (H)  RBC 4.22 - 5.81 MIL/uL 2.51 (L)  Hemoglobin 13.0 - 17.0 g/dL 7.9 (L)  HCT 39.0 - 52.0 % 24.8 (L)  MCV 80.0 - 100.0 fL 98.8  MCH 26.0 - 34.0 pg 31.5  MCHC 30.0 - 36.0 g/dL 31.9  RDW 11.5 - 15.5 % 19.9 (H)  Platelets 150 - 400 K/uL 72 (L)  nRBC 0.0 - 0.2 % 0 /100 WBC 0.0 PENDING  !: Data is abnormal (L): Data is abnormally low (H): Data is abnormally high Rpt: View report in Results Review for more information  E-link notified of above results at 2240.  Some results still pending from Lab draw.
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
## 2021-08-27 NOTE — TOC Progression Note (Signed)
Transition of Care [facility]) - Progression Note    Patient Details  Name: [name] MRN: [id number] Date of Birth: [birth year]  Transition of Care [facility]) CM/SW Contact  Leeroy Cha, RN Phone Number: 08/27/2021, 7:13 AM  Clinical Narrative:    35 year old man with well-controlled HIV presents after fall, transferred to ICU 11/15 with hypotension, encephalopathy.   Encephalopathy improving.  Still with hypotension.  Likely will need to adjust blood pressure goals.  Suspect combination of malnutrition, delirium, undiagnosed cirrhosis with hepatic encephalopathy and cirrhotic related low blood pressure.  Overall lower concern for sepsis although he does have a source of cellulitis, wounds.  Seems improved overall.  New nausea and vomiting likely due to constipation given time her last bowel movement.  QTC mildly lengthened on repeat EKG 11/17.   Synopsis of assessment and plan:   Hypotension: Possible septic shock.  Continue antibiotics for now. More likely FTT, hypoalbuminemia, undiagnosed cirrhotic physiology leading to lower Bps. --SBP goal 90, map of 60, on NE --Midodrine started 11/17   Cellulitis: Transition IV antibiotics to long course of doxycycline 11/17 per ID recs  HIV: Well-controlled, continue antiretrovirals  Positive RPR: With chronic positivity, prior TP-PA positivity.  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  Appreciate ID assistance   Toxic metabolic encephalopathy: Likely multifactorial, concern for hepatic encephalopathy, sepsis, delirium.  Improving.   Nausea, vomiting: likely related to constipation.  Last bowel movement over a week. --Encourage bowel regimen --Ondansetron IV x1, repeat QTC 505, improved from prior but still elevated   TOC PLAN OF CARE: Following for toc needs none at this time.  [facility] is following for wound care. Will need ptar at dc for transport. Progression: wbc-14.7 hgb-8.3 Fecal occult bld is + platelets are  80.   Expected Discharge Plan: [place] Barriers to Discharge: Continued Medical Work up  Expected Discharge Plan and Services Expected Discharge Plan: [place]   Discharge Planning Services: CM Consult Post Acute Care Choice: Resumption of Svcs/PTA Provider (private duty aide) Living arrangements for the past 2 months: Apartment                                       Social Determinants of Health (SDOH) Interventions    Readmission Risk Interventions Readmission Risk Prevention Plan 08/24/2021 08/24/2021 11/01/2019  Transportation Screening Complete - -  PCP or Specialist Appt within 3-5 Days Complete Complete -  Home Care Screening - - Complete  Medication Review (RN CM) - - Complete  HRI or Home Care Consult Complete Complete -  Social Work Consult for [place] Planning/Counseling - Complete -  Palliative Care Screening Complete Complete -  Medication Review Press photographer) Complete Complete -  Some recent data might be hidden

## 2021-08-27 NOTE — Progress Notes (Signed)
Patient refused lactulose this shift.  Patient was educated on benefit of medication, but he still refused.

## 2021-08-27 NOTE — Progress Notes (Signed)
[facility] Progress Note Patient Name: [name] DOB: 1986/06/20 MRN: [id number]   Date of Service  08/27/2021  HPI/Events of Note  K 3.1, Mg 1.2, Pho 1.5  eICU Interventions  Replete 15 mm Kphos IV Replete Mg 2 g IV AM labs ordered     Intervention Category Minor Interventions: Electrolytes abnormality - evaluation and management  [name] [name] 08/27/2021, 10:58 PM

## 2021-08-28 DIAGNOSIS — R6521 Severe sepsis with septic shock: Secondary | ICD-10-CM | POA: Diagnosis not present

## 2021-08-28 DIAGNOSIS — A419 Sepsis, unspecified organism: Secondary | ICD-10-CM | POA: Diagnosis not present

## 2021-08-28 DIAGNOSIS — B20 Human immunodeficiency virus [HIV] disease: Secondary | ICD-10-CM | POA: Diagnosis not present

## 2021-08-28 LAB — GLUCOSE, CAPILLARY
Glucose-Capillary: 97 mg/dL (ref 70–99)
Glucose-Capillary: 112 mg/dL — ABNORMAL HIGH (ref 70–99)
Glucose-Capillary: 101 mg/dL — ABNORMAL HIGH (ref 70–99)
Glucose-Capillary: 92 mg/dL (ref 70–99)

## 2021-08-28 LAB — BASIC METABOLIC PANEL
Anion gap: 5 (ref 5–15)
BUN: 8 mg/dL (ref 6–20)
CO2: 25 mmol/L (ref 22–32)
Calcium: 7.3 mg/dL — ABNORMAL LOW (ref 8.9–10.3)
Chloride: 108 mmol/L (ref 98–111)
Creatinine, Ser: 0.83 mg/dL (ref 0.61–1.24)
GFR, Estimated: >60 mL/min (ref >60)
Glucose, Bld: 104 mg/dL — ABNORMAL HIGH (ref 70–99)
Potassium: 3.1 mmol/L — ABNORMAL LOW (ref 3.5–5.1)
Sodium: 138 mmol/L (ref 135–145)

## 2021-08-28 LAB — CBC
HCT: 22.7 % — ABNORMAL LOW (ref 39.0–52.0)
Hemoglobin: 7.3 g/dL — ABNORMAL LOW (ref 13.0–17.0)
MCH: 32 pg (ref 26.0–34.0)
MCHC: 32.2 g/dL (ref 30.0–36.0)
MCV: 99.6 fL (ref 80.0–100.0)
Platelets: 71 10*3/uL — ABNORMAL LOW (ref 150–400)
RBC: 2.28 MIL/uL — ABNORMAL LOW (ref 4.22–5.81)
RDW: 20.4 % — ABNORMAL HIGH (ref 11.5–15.5)
WBC: 10.6 10*3/uL — ABNORMAL HIGH (ref 4.0–10.5)
nRBC: 0 % (ref 0.0–0.2)

## 2021-08-28 LAB — CBC WITH DIFFERENTIAL/PLATELET
Abs Immature Granulocytes: 0.17 10*3/uL — ABNORMAL HIGH (ref 0.00–0.07)
Basophils Absolute: 0.1 10*3/uL (ref 0.0–0.1)
Basophils Relative: 0 %
Eosinophils Absolute: 0.2 10*3/uL (ref 0.0–0.5)
Eosinophils Relative: 1 %
HCT: 24.8 % — ABNORMAL LOW (ref 39.0–52.0)
Hemoglobin: 7.9 g/dL — ABNORMAL LOW (ref 13.0–17.0)
Immature Granulocytes: 1 %
Lymphocytes Relative: 20 %
Lymphs Abs: 2.7 10*3/uL (ref 0.7–4.0)
MCH: 31.5 pg (ref 26.0–34.0)
MCHC: 31.9 g/dL (ref 30.0–36.0)
MCV: 98.8 fL (ref 80.0–100.0)
Monocytes Absolute: 1.5 10*3/uL — ABNORMAL HIGH (ref 0.1–1.0)
Monocytes Relative: 11 %
Neutro Abs: 8.9 10*3/uL — ABNORMAL HIGH (ref 1.7–7.7)
Neutrophils Relative %: 67 %
Platelets: 72 10*3/uL — ABNORMAL LOW (ref 150–400)
RBC: 2.51 MIL/uL — ABNORMAL LOW (ref 4.22–5.81)
RDW: 19.9 % — ABNORMAL HIGH (ref 11.5–15.5)
WBC: 13.5 10*3/uL — ABNORMAL HIGH (ref 4.0–10.5)
nRBC: 0 % (ref 0.0–0.2)

## 2021-08-28 LAB — MAGNESIUM: Magnesium: 1.4 mg/dL — ABNORMAL LOW (ref 1.7–2.4)

## 2021-08-28 LAB — PHOSPHORUS: Phosphorus: 2.3 mg/dL — ABNORMAL LOW (ref 2.5–4.6)

## 2021-08-28 MED ORDER — LURASIDONE HCL 120 MG PO TABS: 120.0000 mg | ORAL_TABLET | Freq: Every day | ORAL | Status: DC

## 2021-08-28 MED ORDER — ALPRAZOLAM 0.5 MG PO TABS
0.5000 mg | ORAL_TABLET | Freq: Three times a day (TID) | ORAL | Status: DC
  Administered 2021-08-28 – 2021-08-31 (×11): 0.5 mg via ORAL
  Filled 2021-08-28 (×12): qty 1

## 2021-08-28 MED ORDER — MAGNESIUM SULFATE 4 GM/100ML IV SOLN
4.0000 g | Freq: Once | INTRAVENOUS | Status: AC
  Administered 2021-08-28: 4 g via INTRAVENOUS
  Filled 2021-08-28: qty 100

## 2021-08-28 MED ORDER — TRAZODONE HCL 100 MG PO TABS
100.0000 mg | ORAL_TABLET | Freq: Once | ORAL | Status: AC
  Administered 2021-08-28: 100 mg via ORAL
  Filled 2021-08-28: qty 1

## 2021-08-28 MED ORDER — PANTOPRAZOLE SODIUM 40 MG PO TBEC
40.0000 mg | DELAYED_RELEASE_TABLET | Freq: Every day | ORAL | Status: DC
  Administered 2021-08-28 – 2021-08-31 (×4): 40 mg via ORAL
  Filled 2021-08-28 (×4): qty 1

## 2021-08-28 MED ORDER — POTASSIUM PHOSPHATES 15 MMOLE/5ML IV SOLN
15.0000 mmol | Freq: Once | INTRAVENOUS | Status: AC
  Administered 2021-08-28: 15 mmol via INTRAVENOUS
  Filled 2021-08-28: qty 5

## 2021-08-28 MED ORDER — ESCITALOPRAM OXALATE 20 MG PO TABS
20.0000 mg | ORAL_TABLET | Freq: Every day | ORAL | Status: DC
  Administered 2021-08-28 – 2021-08-31 (×3): 20 mg via ORAL
  Filled 2021-08-28 (×4): qty 1

## 2021-08-28 MED ORDER — POTASSIUM CHLORIDE CRYS ER 20 MEQ PO TBCR
20.0000 meq | EXTENDED_RELEASE_TABLET | Freq: Once | ORAL | Status: AC
  Administered 2021-08-28: 20 meq via ORAL
  Filled 2021-08-28: qty 1

## 2021-08-28 MED ORDER — POTASSIUM CHLORIDE 10 MEQ/50ML IV SOLN
10.0000 meq | INTRAVENOUS | Status: AC
  Administered 2021-08-28 (×2): 10 meq via INTRAVENOUS
  Filled 2021-08-28 (×2): qty 50

## 2021-08-28 MED ORDER — ALPRAZOLAM 0.5 MG PO TABS: 0.5000 mg | ORAL_TABLET | Freq: Three times a day (TID) | ORAL | Status: DC

## 2021-08-28 MED ORDER — LURASIDONE HCL 40 MG PO TABS
120.0000 mg | ORAL_TABLET | Freq: Every day | ORAL | Status: DC
  Administered 2021-08-28 – 2021-08-31 (×4): 120 mg via ORAL
  Filled 2021-08-28 (×5): qty 3

## 2021-08-28 NOTE — Progress Notes (Signed)
eLink Physician-Brief Progress Note Patient Name: JGUADALUPE OPIELA DOB: August 08, 1986 MRN: [id number]   Date of Service  08/28/2021  HPI/Events of Note  Patient requests home Trazodone for sleep.   eICU Interventions  Plan: Trazodone 100 mg PO X 1 now.      Intervention Category Major Interventions: Other:  Lysle Dingwall 08/28/2021, 11:31 PM

## 2021-08-28 NOTE — Progress Notes (Signed)
[facility] ADULT ICU REPLACEMENT PROTOCOL   The patient does apply for the [facility] Adult ICU Electrolyte Replacment Protocol based on the criteria listed below:   1.Exclusion criteria: TCTS patients, ECMO patients, and Dialysis patients 2. Is GFR >/= 30 ml/min? Yes.    Patient's GFR today is >60 3. Is SCr </= 2? Yes.   Patient's SCr is 0.83 mg/dL 4. Did SCr increase >/= 0.5 in 24 hours? No. 5.Pt's weight >40kg  Yes.   6. Abnormal electrolyte(s): K+ 3.1, phos 2.3, mag 1.4  7. Electrolytes replaced per protocol 8.  Call MD STAT for K+ </= 2.5, Phos </= 1, or Mag </= 1 Physician:  n/a  [name] 08/28/2021 6:38 AM

## 2021-08-28 NOTE — Progress Notes (Signed)
Pt notified of tranfer to 4E. Pt's belongings collected. Report called to 4E RN. Pt stable at time of transfer.E RN met [place] in pt's new room.
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
## 2021-08-29 DIAGNOSIS — D510 Vitamin B12 deficiency anemia due to intrinsic factor deficiency: Secondary | ICD-10-CM

## 2021-08-29 DIAGNOSIS — E871 Hypo-osmolality and hyponatremia: Secondary | ICD-10-CM

## 2021-08-29 LAB — CBC
HCT: 22.8 % — ABNORMAL LOW (ref 39.0–52.0)
Hemoglobin: 7.5 g/dL — ABNORMAL LOW (ref 13.0–17.0)
MCH: 32.6 pg (ref 26.0–34.0)
MCHC: 32.9 g/dL (ref 30.0–36.0)
MCV: 99.1 fL (ref 80.0–100.0)
Platelets: 73 10*3/uL — ABNORMAL LOW (ref 150–400)
RBC: 2.3 MIL/uL — ABNORMAL LOW (ref 4.22–5.81)
RDW: 19.5 % — ABNORMAL HIGH (ref 11.5–15.5)
WBC: 10 10*3/uL (ref 4.0–10.5)
nRBC: 0 % (ref 0.0–0.2)

## 2021-08-29 LAB — BASIC METABOLIC PANEL
Anion gap: 4 — ABNORMAL LOW (ref 5–15)
BUN: 9 mg/dL (ref 6–20)
CO2: 27 mmol/L (ref 22–32)
Calcium: 7.6 mg/dL — ABNORMAL LOW (ref 8.9–10.3)
Chloride: 107 mmol/L (ref 98–111)
Creatinine, Ser: 0.83 mg/dL (ref 0.61–1.24)
GFR, Estimated: >60 mL/min (ref >60)
Glucose, Bld: 94 mg/dL (ref 70–99)
Potassium: 3.2 mmol/L — ABNORMAL LOW (ref 3.5–5.1)
Sodium: 138 mmol/L (ref 135–145)

## 2021-08-29 LAB — GLUCOSE, CAPILLARY
Glucose-Capillary: 80 mg/dL (ref 70–99)
Glucose-Capillary: 108 mg/dL — ABNORMAL HIGH (ref 70–99)
Glucose-Capillary: 92 mg/dL (ref 70–99)
Glucose-Capillary: 83 mg/dL (ref 70–99)

## 2021-08-29 LAB — CULTURE, BLOOD (ROUTINE X 2)
Culture: NO GROWTH
Culture: NO GROWTH
Special Requests: ADEQUATE

## 2021-08-29 LAB — PHOSPHORUS: Phosphorus: 2.4 mg/dL — ABNORMAL LOW (ref 2.5–4.6)

## 2021-08-29 LAB — MAGNESIUM: Magnesium: 2 mg/dL (ref 1.7–2.4)

## 2021-08-29 MED ORDER — K PHOS MONO-SOD PHOS DI & MONO 155-852-130 MG PO TABS
500.0000 mg | ORAL_TABLET | Freq: Once | ORAL | Status: AC
  Administered 2021-08-29: 500 mg via ORAL
  Filled 2021-08-29: qty 2

## 2021-08-29 MED ORDER — SODIUM CHLORIDE 0.9 % IV BOLUS
1000.0000 mL | Freq: Once | INTRAVENOUS | Status: AC
  Administered 2021-08-29: 1000 mL via INTRAVENOUS

## 2021-08-29 MED ORDER — DOXYCYCLINE HYCLATE 100 MG PO TABS
100.0000 mg | ORAL_TABLET | Freq: Two times a day (BID) | ORAL | Status: DC
  Administered 2021-08-29 – 2021-08-31 (×4): 100 mg via ORAL
  Filled 2021-08-29 (×5): qty 1

## 2021-08-29 MED ORDER — POTASSIUM CHLORIDE CRYS ER 20 MEQ PO TBCR
40.0000 meq | EXTENDED_RELEASE_TABLET | Freq: Two times a day (BID) | ORAL | Status: AC
  Administered 2021-08-29 – 2021-08-30 (×2): 40 meq via ORAL
  Filled 2021-08-29 (×2): qty 2

## 2021-08-29 MED ORDER — TRAZODONE HCL 50 MG PO TABS
50.0000 mg | ORAL_TABLET | Freq: Every evening | ORAL | Status: DC | PRN
  Administered 2021-08-30 (×2): 50 mg via ORAL
  Filled 2021-08-29 (×3): qty 1
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
## 2021-08-29 NOTE — Progress Notes (Signed)
Patient refused lactulose. Education provided and pt verbalized understanding but still refuses.

## 2021-08-30 DIAGNOSIS — D649 Anemia, unspecified: Secondary | ICD-10-CM

## 2021-08-30 LAB — PREPARE RBC (CROSSMATCH)

## 2021-08-30 LAB — COMPREHENSIVE METABOLIC PANEL
ALT: 27 U/L (ref 0–44)
AST: 34 U/L (ref 15–41)
Albumin: <1.5 g/dL — ABNORMAL LOW (ref 3.5–5.0)
Alkaline Phosphatase: 203 U/L — ABNORMAL HIGH (ref 38–126)
Anion gap: 3 — ABNORMAL LOW (ref 5–15)
BUN: 9 mg/dL (ref 6–20)
CO2: 27 mmol/L (ref 22–32)
Calcium: 7.4 mg/dL — ABNORMAL LOW (ref 8.9–10.3)
Chloride: 108 mmol/L (ref 98–111)
Creatinine, Ser: 1.05 mg/dL (ref 0.61–1.24)
GFR, Estimated: >60 mL/min (ref >60)
Glucose, Bld: 97 mg/dL (ref 70–99)
Potassium: 3.3 mmol/L — ABNORMAL LOW (ref 3.5–5.1)
Sodium: 138 mmol/L (ref 135–145)
Total Bilirubin: 1.1 mg/dL (ref 0.3–1.2)
Total Protein: 5.7 g/dL — ABNORMAL LOW (ref 6.5–8.1)

## 2021-08-30 LAB — CBC WITH DIFFERENTIAL/PLATELET
Abs Immature Granulocytes: 0 10*3/uL (ref 0.00–0.07)
Basophils Absolute: 0.1 10*3/uL (ref 0.0–0.1)
Basophils Relative: 1 %
Eosinophils Absolute: 0.1 10*3/uL (ref 0.0–0.5)
Eosinophils Relative: 1 %
HCT: 20.4 % — ABNORMAL LOW (ref 39.0–52.0)
Hemoglobin: 6.5 g/dL — CL (ref 13.0–17.0)
Lymphocytes Relative: 15 %
Lymphs Abs: 1.4 10*3/uL (ref 0.7–4.0)
MCH: 32.2 pg (ref 26.0–34.0)
MCHC: 31.9 g/dL (ref 30.0–36.0)
MCV: 101 fL — ABNORMAL HIGH (ref 80.0–100.0)
Monocytes Absolute: 0.4 10*3/uL (ref 0.1–1.0)
Monocytes Relative: 4 %
Neutro Abs: 7.6 10*3/uL (ref 1.7–7.7)
Neutrophils Relative %: 79 %
Platelets: 76 10*3/uL — ABNORMAL LOW (ref 150–400)
RBC: 2.02 MIL/uL — ABNORMAL LOW (ref 4.22–5.81)
RDW: 19.5 % — ABNORMAL HIGH (ref 11.5–15.5)
WBC: 9.6 10*3/uL (ref 4.0–10.5)
nRBC: 0 % (ref 0.0–0.2)

## 2021-08-30 LAB — GLUCOSE, CAPILLARY
Glucose-Capillary: 92 mg/dL (ref 70–99)
Glucose-Capillary: 103 mg/dL — ABNORMAL HIGH (ref 70–99)
Glucose-Capillary: 132 mg/dL — ABNORMAL HIGH (ref 70–99)
Glucose-Capillary: 95 mg/dL (ref 70–99)

## 2021-08-30 LAB — PHOSPHORUS: Phosphorus: 2.8 mg/dL (ref 2.5–4.6)

## 2021-08-30 LAB — MAGNESIUM: Magnesium: 1.8 mg/dL (ref 1.7–2.4)

## 2021-08-30 MED ORDER — SODIUM CHLORIDE 0.9% IV SOLUTION
Freq: Once | INTRAVENOUS | Status: AC
  Administered 2021-08-30: 1 mL via INTRAVENOUS

## 2021-08-30 NOTE — Evaluation (Signed)
Physical Therapy Evaluation Patient Details Name: [name] MRN: [id number] DOB: 03-Feb-1986 Today's Date: 08/30/2021  History of Present Illness  35 yo male admitted with sepsis, hypotension after having fall at home. Hx of HIV, PTSD, schizoaffective d/o, Sz, L hip AVN, neuropathy, polysubstance abuse  Clinical Impression  On eval, pt required Min A for mobility. He was having blood transfusion at the time of this eval. He performed a stand pivot and walked a short distance in the room with a RW.  Total assist for toilet hygiene (at pt's request). Discussed d/c plan-pt stated he plans to d/c home-he prefers HHPT f/u-he will likely decline SNF placement (he did last admission as well). Will plan to follow and progress activity as tolerated.      Recommendations for follow up therapy are one component of a multi-disciplinary discharge planning process, led by the attending physician.  Recommendations may be updated based on patient status, additional functional criteria and insurance authorization.  Follow Up Recommendations Skilled nursing-short term rehab (<3 hours/day) (HHPT if pt declines placement which he likely will. He states he prefers HHPT.)    Assistance Recommended at Discharge Frequent or constant Supervision/Assistance  Functional Status Assessment Patient has had a recent decline in their functional status and demonstrates the ability to make significant improvements in function in a reasonable and predictable amount of time.  Equipment Recommendations  None recommended by PT    Recommendations for Other Services       Precautions / Restrictions Precautions Precautions: Fall Restrictions Weight Bearing Restrictions: No      Mobility  Bed Mobility Overal bed mobility: Needs Assistance Bed Mobility: Supine to Sit;Sit to Supine     Supine to sit: Min assist;HOB elevated Sit to supine: Min assist;HOB elevated   General bed mobility comments: Assist for L LE.  Increased time.    Transfers Overall transfer level: Needs assistance Equipment used: Rolling walker (2 wheels) Transfers: Sit to/from Stand Sit to Stand: Min assist;From elevated surface           General transfer comment: Assist to rise, steady, control descent. Cues for safety. Stand pivot x 2 with RW    Ambulation/Gait Ambulation/Gait assistance: Min assist Gait Distance (Feet): 5 Feet Assistive device: Rolling walker (2 wheels) Gait Pattern/deviations: Step-to pattern       General Gait Details: Pt took a few steps up to [facility] from [facility]. Assist to steady. Slow pace. Pt denied dizziness.  Stairs            Wheelchair Mobility    Modified Rankin (Stroke Patients Only)       Balance Overall balance assessment: Needs assistance;History of Falls         Standing balance support: Bilateral upper extremity supported Standing balance-Leahy Scale: Poor                               Pertinent Vitals/Pain Pain Assessment: No/denies pain Pain Score: 0-No pain Pain Location: L hip Pain Descriptors / Indicators: Discomfort;Sore;Aching Pain Intervention(s): Limited activity within patient's tolerance;Monitored during session;Repositioned    Home Living Family/patient expects to be discharged to:: Private residence Living Arrangements: Alone Available Help at Discharge: Family;Available PRN/intermittently;Personal care attendant (aide M-F 3 hours) Type of Home: Apartment Home Access: Stairs to enter Entrance Stairs-Rails: Right Entrance Stairs-Number of Steps: 3   Home Layout: One level Home Equipment: Conservation officer, nature (2 wheels);Kasandra Knudsen - single point      Prior Function  Prior Level of Function : Needs assist             Mobility Comments: uses RW for ambulation ADLs Comments: had aide a few hours during the week     Hand Dominance        Extremity/Trunk Assessment   Upper Extremity Assessment Upper Extremity Assessment: Defer to OT  evaluation    Lower Extremity Assessment Lower Extremity Assessment: Generalized weakness    Cervical / Trunk Assessment Cervical / Trunk Assessment: Normal  Communication   Communication: No difficulties  Cognition Arousal/Alertness: Awake/alert Behavior During Therapy: WFL for tasks assessed/performed Overall Cognitive Status: Within Functional Limits for tasks assessed                                          General Comments      Exercises     Assessment/Plan    PT Assessment Patient needs continued PT services  PT Problem List Decreased strength;Decreased mobility;Decreased activity tolerance;Decreased knowledge of use of DME;Pain       PT Treatment Interventions DME instruction;Gait training;Therapeutic activities;Therapeutic exercise;Patient/family education;Balance training;Functional mobility training    PT Goals (Current goals can be found in the Care Plan section)  Acute Rehab PT Goals Patient Stated Goal: home PT Goal Formulation: With patient Time For Goal Achievement: 09/13/21 Potential to Achieve Goals: Good    Frequency Min 3X/week   Barriers to discharge Decreased caregiver support      Co-evaluation               AM-PAC PT "6 Clicks" Mobility  Outcome Measure Help needed turning from your back to your side while in a flat bed without using bedrails?: A Little Help needed moving from lying on your back to sitting on the side of a flat bed without using bedrails?: A Little Help needed moving to and from a bed to a chair (including a wheelchair)?: A Little Help needed standing up from a chair using your arms (e.g., wheelchair or bedside chair)?: A Little Help needed to walk in hospital room?: A Little Help needed climbing 3-5 steps with a railing? : A Lot 6 Click Score: 17    End of Session Equipment Utilized During Treatment: Gait belt Activity Tolerance: Patient tolerated treatment well Patient left: in bed;with call  bell/phone within reach;with bed alarm set   PT Visit Diagnosis: Muscle weakness (generalized) (M62.81);Difficulty in walking, not elsewhere classified (R26.2);History of falling (Z91.81);Repeated falls (R29.6)    Time: 8563-1497 PT Time Calculation (min) (ACUTE ONLY): 22 min   Charges:   PT Evaluation $PT Eval Moderate Complexity: 1 Mod             Doreatha Massed, PT Acute Rehabilitation  Office: [phone] Pager: [phone]

## 2021-08-30 NOTE — Progress Notes (Signed)
PT Cancellation Note  Patient Details Name: [name] MRN: [id number] DOB: [birth year]   Cancelled Treatment:    Reason Eval/Treat Not Completed: Medical issues which prohibited therapy: low hgb-pending transfusion. Will hold PT and check back as schedule allows.    [place] [facility]  Office: [phone] Pager: [phone]
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
## 2021-08-30 NOTE — Progress Notes (Signed)
Calorie Count Note  48 hour calorie count ordered.  Diet: Dysphagia 3, nectar-thick liquids Supplements: Juven BID, 30 ml Prosource Plus QID  Dinner 11/17: 128 kcal and 19 grams protein Breakfast 11/18: 142 kcal and 3 grams protein Dinner 11/18: bites only Breakfast 11/19: 292 kcal and 6 grams protein Dinner 11/19: 337 kcal and 9 grams protein Breakfast 11/21: 966 kcal and 22 grams protein  Supplements:  RN at bedside and reports that patient has been refusing Juven but will accept Prosource Plus   Nutrition Dx:  Increased nutrient needs related to acute illness, wound healing as evidenced by estimated needs.  Goal:  Patient will meet greater than or equal to 90% of their needs  Intervention:  - will d/c Juven - continue 30 ml Prosource Plus QID       Jarome Matin, MS, RD, LDN, CNSC Inpatient Clinical Dietitian RD pager # available in [place]  After hours/weekend pager # available in [facility]

## 2021-08-30 NOTE — Care Management Important Message (Signed)
Important Message  Patient Details IM Letter given to the Patient. Name: [name] MRN: [id number] Date of Birth: 1986/01/12   Medicare Important Message Given:  Yes     Kerin Salen 08/30/2021, 1:28 PM

## 2021-08-30 NOTE — TOC Progression Note (Signed)
Transition of Care [facility]) - Progression Note    Patient Details  Name: [name] MRN: [id number] Date of Birth: [birth year]  Transition of [facility]) CM/SW Contact  Leeroy Cha, RN Phone Number: 08/30/2021, 9:43 AM  Clinical Narrative TOC PLAN OF CARE: Hgb 6.5 rec'ing one unit of prbc [id number] Plan is home with self care once stable    Expected Discharge Plan: [place] Barriers to Discharge: Continued Medical Work up  Expected Discharge Plan and Services Expected Discharge Plan: [place]   Discharge Planning Services: CM Consult Post Acute Care Choice: Resumption of Svcs/PTA Provider (private duty aide) Living arrangements for the past 2 months: Apartment                                       Social Determinants of Health (SDOH) Interventions    Readmission Risk Interventions Readmission Risk Prevention Plan 08/24/2021 08/24/2021 11/01/2019  Transportation Screening Complete - -  PCP or Specialist Appt within 3-5 Days Complete Complete -  Home Care Screening - - Complete  Medication Review (RN CM) - - Complete  HRI or Home Care Consult Complete Complete -  Social Work Consult for [place] Planning/Counseling - Complete -  Palliative Care Screening Complete Complete -  Medication Review Press photographer) Complete Complete -  Some recent data might be hidden

## 2021-08-30 NOTE — Progress Notes (Signed)
Nutrition Follow-up  DOCUMENTATION CODES:   Not applicable  INTERVENTION:  - will d/c Juven. - continue 30 ml Prosource Plus QID.   NUTRITION DIAGNOSIS:   Increased nutrient needs related to acute illness, wound healing as evidenced by estimated needs. -ongoing  GOAL:   Patient will meet greater than or equal to 90% of their needs -currently unmet, but improving with each meal  MONITOR:   PO intake, Supplement acceptance, Labs, Weight trends  REASON FOR ASSESSMENT:   Consult Assessment of nutrition requirement/status  ASSESSMENT:   35 year old male with medical history of HIV, chronic avascular necrosis L hip, bipolar disorder, depression, PTSD, schizoaffective disorder, seizures, essential HTN, peripheral neuropathy, severe alcohol dependence, tobacco use disorder, and GERD. He presented to the ED via EMS on 11/14 after a mechanical fall with laceration to R eyebrow. In the ED he was somnolent but arousable. He was confused and unable to provide any meaningful information. In the ED, x-ray of L hip showed chronic vascular necrosis, no acute abnormality. CT head and cervical spine was negative for any acute findings. he was admitted with acute metabolic encephalopathy, severe sepsis, and symptomatic anemia.  Patient moved from 2W to 4E on 11/19 evening.   Follow-up note from Calorie Count entered a short time ago.   Patient laying in bed at the time of visit. He reports eating 100% of breakfast this AM without issue. RN was at bedside and shared that patient has been accepting Prosource Plus but is refusing Juven.  RD asked patient if she could share with him the rationale for Juven, but patient declined for rationale and information on the supplement to be shared. He was appreciative when RD offers to discontinue the supplement.   He was last weighted on 11/19 and this showed that weight had been stable 11/17, 11/18, and 11/19.    Moderate pitting edema to BLE documented in  the edema section of flow sheet. On 11/19 and 11/20 this was documented as deep pitting.   TOC note from today indicates anticipated d/c plan is for home with Home Health.    Labs reviewed; CBGs: 92 and 103 mg/dl, K: 3.3 mmol/l, Alk Phos elevated.   Medications reviewed; 1 mg folvite/day, sliding scale novolog, 20 g lactulose BID, 400 mg megace/day started 11/17, 1 tablet multivitamin with minerals/day, 40 mg oral protonix/day, 500 mg KPhos neutral x1 dose 11/20, 40 mEq Klor-Con x2 doses 11/20, 100 mg oral thiamine/day.    Diet Order:   Diet Order             DIET DYS 3 Room service appropriate? Yes; Fluid consistency: Nectar Thick  Diet effective now                   EDUCATION NEEDS:   Not appropriate for education at this time  Skin:  Skin Assessment: Skin Integrity Issues: Skin Integrity Issues:: Other (Comment) Other: laceration to L eyebrow; non-pressure injuries to: L and R great toes, L heel, R foot, R leg, R arm; open ulcer to scrotum; red excoriated skin to bilateral buttocks  Last BM:  11/19 (type 6 x1, large amount)  Height:   Ht Readings from Last 1 Encounters:  08/23/21 6' 3"  (1.905 m)    Weight:   Wt Readings from Last 1 Encounters:  08/28/21 109.5 kg     Estimated Nutritional Needs:  Kcal:  2600-2850 kcal Protein:  145-160 grams Fluid:  >/= 2.5 L/day     Jarome Matin, MS, RD, LDN, CNSC  Inpatient Clinical Dietitian RD pager # available in [place]  After hours/weekend pager # available in [facility]

## 2021-08-30 NOTE — Progress Notes (Signed)
OT Cancellation Note  Patient Details Name: [name] MRN: [id number] DOB: 1986/07/29   Cancelled Treatment:    Reason Eval/Treat Not Completed: Medical issues which prohibited therapy patients Hgb is 6.5 with patient getting blood transfusion. Nursing asking to check back later. OT to continue to follow and check back as able.   Jackelyn Poling OTR/L, [place] [facility] Office# [phone] Pager# [phone]    08/30/2021, 9:10 AM

## 2021-08-30 NOTE — Progress Notes (Signed)
Speech Language Pathology Treatment: Dysphagia  Patient Details Name: RASHAUD YBARBO MRN: [id number] DOB: 01-10-1986 Today's Date: 08/30/2021 Time: 1500-1520 SLP Time Calculation (min) (ACUTE ONLY): 20 min  Assessment / Plan / Recommendation Clinical Impression  Patient seen by SLP to address dysphagia goals with focus on upgraded solids texture trial and observation with his toleration of thin liquids. Patient consumed sips of thin liquids via cup sips and then straw sips, exhibiting several instances of immediate, wet cough response. He asked about getting off "that nasty chewy food" (referring to Dys 3). SLP observed him with a regular texture solid and he tolerated it without overt s/s aspiration or penetration. SLP reviewed patient's MBS from previous admission back in August of 2022 which showed swallow initiation delay with inconsistent, sensed aspiration of thin liquids during the swallow with larger sip size. Patient did demonstrate recall of recommendation for smaller sips, however he is not very compliant overall. SLP suspects that patient's swallow function is largely unchanged since MBS in August 2022 and that he has chronic sensory-based dysphagia leading to inconsistent aspiration of thin liquids. SLP to follow up at least one more time to determine least restrictive diet.   HPI HPI: 35 yo male adm to [facility] after fall, Pt imaging all negative for acute change - including CT neck, CT head, CXR negative.  Swallow evaluation ordered.  Pt has h/o bipolar d/o, dysphagia, FTT, and ETOH abuse.    Swallow evaluation ordered.      SLP Plan  Continue with current plan of care      Recommendations for follow up therapy are one component of a multi-disciplinary discharge planning process, led by the attending physician.  Recommendations may be updated based on patient status, additional functional criteria and insurance authorization.    Recommendations  Diet recommendations: Nectar-thick  liquid;Regular Liquids provided via: Straw Medication Administration: Whole meds with puree Supervision: Patient able to self feed Compensations: Slow rate;Small sips/bites Postural Changes and/or Swallow Maneuvers: Seated upright 90 degrees;Upright 30-60 min after meal                Oral Care Recommendations: Oral care BID Follow Up Recommendations: No SLP follow up Assistance recommended at discharge: None SLP Visit Diagnosis: Dysphagia, pharyngeal phase (R13.13) Plan: Continue with current plan of care       Sonia Baller, MA, CCC-SLP Speech Therapy

## 2021-08-30 NOTE — Plan of Care (Signed)
  Problem: Clinical Measurements: Goal: Will remain free from infection Outcome: Progressing Goal: Respiratory complications will improve Outcome: Progressing   Problem: Coping: Goal: Level of anxiety will decrease Outcome: Progressing   Problem: Elimination: Goal: Will not experience complications related to bowel motility Outcome: Progressing Goal: Will not experience complications related to urinary retention Outcome: Progressing   Problem: Pain Managment: Goal: General experience of comfort will improve Outcome: Progressing   Problem: Education: Goal: Knowledge of General Education information will improve Description: Including pain rating scale, medication(s)/side effects and non-pharmacologic comfort measures Outcome: Not Progressing   Problem: Health Behavior/Discharge Planning: Goal: Ability to manage health-related needs will improve Outcome: Not Progressing   Problem: Clinical Measurements: Goal: Ability to maintain clinical measurements within normal limits will improve Outcome: Not Progressing Goal: Diagnostic test results will improve Outcome: Not Progressing Goal: Cardiovascular complication will be avoided Outcome: Not Progressing   Problem: Activity: Goal: Risk for activity intolerance will decrease Outcome: Not Progressing   Problem: Nutrition: Goal: Adequate nutrition will be maintained Outcome: Not Progressing   Problem: Safety: Goal: Ability to remain free from injury will improve Outcome: Not Progressing   Problem: Skin Integrity: Goal: Risk for impaired skin integrity will decrease Outcome: Not Progressing

## 2021-08-31 ENCOUNTER — Ambulatory Visit: Payer: Medicare HMO | Admitting: Neurology

## 2021-08-31 DIAGNOSIS — R0902 Hypoxemia: Secondary | ICD-10-CM

## 2021-08-31 LAB — TYPE AND SCREEN
ABO/RH(D): O POS
Antibody Screen: NEGATIVE
Unit division: 0

## 2021-08-31 LAB — CBC WITH DIFFERENTIAL/PLATELET
Abs Immature Granulocytes: 0.05 10*3/uL (ref 0.00–0.07)
Basophils Absolute: 0.1 10*3/uL (ref 0.0–0.1)
Basophils Relative: 1 %
Eosinophils Absolute: 0.1 10*3/uL (ref 0.0–0.5)
Eosinophils Relative: 1 %
HCT: 23.9 % — ABNORMAL LOW (ref 39.0–52.0)
Hemoglobin: 7.6 g/dL — ABNORMAL LOW (ref 13.0–17.0)
Immature Granulocytes: 1 %
Lymphocytes Relative: 27 %
Lymphs Abs: 2.8 10*3/uL (ref 0.7–4.0)
MCH: 32.2 pg (ref 26.0–34.0)
MCHC: 31.8 g/dL (ref 30.0–36.0)
MCV: 101.3 fL — ABNORMAL HIGH (ref 80.0–100.0)
Monocytes Absolute: 0.8 10*3/uL (ref 0.1–1.0)
Monocytes Relative: 8 %
Neutro Abs: 6.6 10*3/uL (ref 1.7–7.7)
Neutrophils Relative %: 62 %
Platelets: 84 10*3/uL — ABNORMAL LOW (ref 150–400)
RBC: 2.36 MIL/uL — ABNORMAL LOW (ref 4.22–5.81)
RDW: 20.2 % — ABNORMAL HIGH (ref 11.5–15.5)
WBC: 10.4 10*3/uL (ref 4.0–10.5)
nRBC: 0 % (ref 0.0–0.2)

## 2021-08-31 LAB — COMPREHENSIVE METABOLIC PANEL
ALT: 26 U/L (ref 0–44)
AST: 31 U/L (ref 15–41)
Albumin: <1.5 g/dL — ABNORMAL LOW (ref 3.5–5.0)
Alkaline Phosphatase: 205 U/L — ABNORMAL HIGH (ref 38–126)
Anion gap: 3 — ABNORMAL LOW (ref 5–15)
BUN: 10 mg/dL (ref 6–20)
CO2: 26 mmol/L (ref 22–32)
Calcium: 7.7 mg/dL — ABNORMAL LOW (ref 8.9–10.3)
Chloride: 110 mmol/L (ref 98–111)
Creatinine, Ser: 1.02 mg/dL (ref 0.61–1.24)
GFR, Estimated: >60 mL/min (ref >60)
Glucose, Bld: 105 mg/dL — ABNORMAL HIGH (ref 70–99)
Potassium: 3.4 mmol/L — ABNORMAL LOW (ref 3.5–5.1)
Sodium: 139 mmol/L (ref 135–145)
Total Bilirubin: 0.9 mg/dL (ref 0.3–1.2)
Total Protein: 6.3 g/dL — ABNORMAL LOW (ref 6.5–8.1)

## 2021-08-31 LAB — GLUCOSE, CAPILLARY
Glucose-Capillary: 97 mg/dL (ref 70–99)
Glucose-Capillary: 116 mg/dL — ABNORMAL HIGH (ref 70–99)
Glucose-Capillary: 114 mg/dL — ABNORMAL HIGH (ref 70–99)

## 2021-08-31 LAB — BPAM RBC
Blood Product Expiration Date: 202212212359
ISSUE DATE / TIME: 202211211349
Unit Type and Rh: 5100

## 2021-08-31 LAB — PHOSPHORUS: Phosphorus: 2.6 mg/dL (ref 2.5–4.6)

## 2021-08-31 LAB — MAGNESIUM: Magnesium: 1.5 mg/dL — ABNORMAL LOW (ref 1.7–2.4)

## 2021-08-31 MED ORDER — ADULT MULTIVITAMIN W/MINERALS CH: 1.0000 | ORAL_TABLET | Freq: Every day | ORAL | 0 refills | Status: AC

## 2021-08-31 MED ORDER — MAGNESIUM SULFATE 2 GM/50ML IV SOLN
2.0000 g | Freq: Once | INTRAVENOUS | Status: AC
  Administered 2021-08-31: 2 g via INTRAVENOUS
  Filled 2021-08-31: qty 50

## 2021-08-31 MED ORDER — NICOTINE 14 MG/24HR TD PT24
14.0000 mg | MEDICATED_PATCH | Freq: Every day | TRANSDERMAL | 0 refills | Status: DC
Start: 2021-09-01 — End: 2022-07-07

## 2021-08-31 MED ORDER — POTASSIUM CHLORIDE CRYS ER 20 MEQ PO TBCR
40.0000 meq | EXTENDED_RELEASE_TABLET | Freq: Once | ORAL | Status: AC
  Administered 2021-08-31: 40 meq via ORAL
  Filled 2021-08-31: qty 2

## 2021-08-31 MED ORDER — DOXYCYCLINE HYCLATE 100 MG PO TABS: 100.0000 mg | ORAL_TABLET | Freq: Two times a day (BID) | ORAL | 1 refills | Status: DC

## 2021-08-31 MED ORDER — MIDODRINE HCL 10 MG PO TABS: 10.0000 mg | ORAL_TABLET | Freq: Three times a day (TID) | ORAL | 0 refills | Status: DC

## 2021-08-31 MED ORDER — THIAMINE HCL 100 MG PO TABS: 100.0000 mg | ORAL_TABLET | Freq: Every day | ORAL | 0 refills | Status: DC

## 2021-08-31 MED ORDER — FOOD THICKENER (SIMPLYTHICK): 1.0000 | ORAL | 0 refills | Status: DC | PRN

## 2021-08-31 MED ORDER — PROSOURCE PLUS PO LIQD
30.0000 mL | Freq: Four times a day (QID) | ORAL | 0 refills | Status: DC
Start: 2021-08-31 — End: 2021-11-04

## 2021-08-31 MED ORDER — MAGNESIUM OXIDE -MG SUPPLEMENT 400 (240 MG) MG PO TABS
800.0000 mg | ORAL_TABLET | Freq: Once | ORAL | Status: AC
  Administered 2021-08-31: 800 mg via ORAL
  Filled 2021-08-31: qty 2

## 2021-08-31 MED ORDER — LACTULOSE 10 GM/15ML PO SOLN: 20.0000 g | Freq: Two times a day (BID) | ORAL | 0 refills | Status: DC

## 2021-08-31 NOTE — Discharge Summary (Signed)
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   Final    BOTTLES DRAWN AEROBIC AND ANAEROBIC Blood Culture results may not be optimal due to an inadequate volume of blood received in culture bottles Performed at [facility] [address], [place], [place] [address]    Culture   Final    NO GROWTH 5 DAYS Performed at [facility], [place] [address], [place], [place] [address]    Report Status 08/29/2021 FINAL  Final  Resp Panel by RT-PCR (Flu A&B, Covid) Nasopharyngeal Swab      Status: None   Collection Time: 08/23/21  9:00 PM   Specimen: Nasopharyngeal Swab; Nasopharyngeal(NP) swabs in vial transport medium  Result Value Ref Range Status   SARS Coronavirus 2 by RT PCR NEGATIVE NEGATIVE Final    Comment: (NOTE) SARS-CoV-2 target nucleic acids are NOT DETECTED.  The SARS-CoV-2 RNA is generally detectable in upper respiratory specimens during the acute phase of infection. The lowest concentration of SARS-CoV-2 viral copies this assay can detect is 138 copies/mL. A negative result does not preclude SARS-Cov-2 infection and should not be used as the sole basis for treatment or other patient management decisions. A negative result may occur with  improper specimen collection/handling, submission of specimen other than nasopharyngeal swab, presence of viral mutation(s) within the areas targeted by this assay, and inadequate number of viral copies(<138 copies/mL). A negative result must be combined with clinical observations, patient history, and epidemiological information. The expected result is Negative.  Fact Sheet for Patients:  EntrepreneurPulse.com.au  Fact Sheet for Healthcare Providers:  IncredibleEmployment.be  This test is no t yet approved or cleared by the [place] FDA and  has been authorized for detection and/or diagnosis of SARS-CoV-2 by FDA under an Emergency Use Authorization (EUA). This EUA will remain  in effect (meaning this test can be used) for the duration of the COVID-19 declaration under Section 564(b)(1) of the Act, 21 U.S.C.section 360bbb-3(b)(1), unless the authorization is terminated  or revoked sooner.       Influenza A by PCR NEGATIVE NEGATIVE Final   Influenza B by PCR NEGATIVE NEGATIVE Final    Comment: (NOTE) The Xpert Xpress SARS-CoV-2/FLU/RSV plus assay is intended as an aid in the diagnosis of influenza from Nasopharyngeal swab specimens and should not be used as a sole basis  for treatment. Nasal washings and aspirates are unacceptable for Xpert Xpress SARS-CoV-2/FLU/RSV testing.  Fact Sheet for Patients: EntrepreneurPulse.com.au  Fact Sheet for Healthcare Providers: IncredibleEmployment.be  This test is not yet approved or cleared by the [place] FDA and has been authorized for detection and/or diagnosis of SARS-CoV-2 by FDA under an Emergency Use Authorization (EUA). This EUA will remain in effect (meaning this test can be used) for the duration of the COVID-19 declaration under Section 564(b)(1) of the Act, 21 U.S.C. section 360bbb-3(b)(1), unless the authorization is terminated or revoked.  Performed at [facility], [place] [address] [place], [place] [address]   MRSA Next Gen by PCR, Nasal     Status: Abnormal   Collection Time: 08/24/21  5:01 AM   Specimen: Nasal Mucosa; Nasal Swab  Result Value Ref Range Status   MRSA by PCR Next Gen DETECTED (A) NOT DETECTED Final    Comment: RESULT CALLED TO, READ BACK BY AND VERIFIED WITH: BULL, E. RN ON 08/24/2021 @ 6153 BY MECIAL J.  (NOTE) The GeneXpert MRSA Assay (FDA approved for NASAL specimens only), is one component of a comprehensive MRSA colonization surveillance program. It is not intended to diagnose MRSA infection nor to guide or monitor treatment for MRSA infections. Test performance is not FDA approved in patients less than 70 years old. Performed at [facility], [place] [address], [place], [place] [address]   Culture, blood (Routine X 2) w Reflex to ID Panel     Status: None   Collection Time: 08/24/21  8:11 AM   Specimen: BLOOD LEFT HAND  Result Value Ref Range Status   Specimen Description   Final    BLOOD LEFT HAND Performed at [facility] [address], [place], [place] [address]    Special Requests   Final    BOTTLES DRAWN AEROBIC ONLY Blood Culture adequate volume Performed at  [facility] [address], [place], [place] [address]    Culture   Final    NO GROWTH 5 DAYS Performed at [facility], [place] [address], [place], [place] [address]    Report Status 08/29/2021 FINAL  Final   Time coordinating discharge: 35 minutes  SIGNED:  Kerney Elbe, DO [facility] 08/31/2021, 9:30 PM Pager is on AMION  If 7PM-7AM, please contact night-coverage [email or web address]

## 2021-08-31 NOTE — TOC Transition Note (Addendum)
Transition of Care [facility]) - CM/SW Discharge Note   Patient Details  Name: [name] MRN: [id number] Date of Birth: [birth year]  [facility] CM/SW Contact:  Leeroy Cha, RN Phone Number: 08/31/2021, 3:00 PM   Clinical Narrative:    Patient dcd  With hhc rn,pt,ot and aide through [facility]      Discharge Plan and Services   Discharge Planning Services: CM Consult Post Acute Care Choice: Resumption of Svcs/PTA Provider (private duty aide)                    HH Arranged: RN, PT, OT, Nurse's Aide [place] Agency: [facility] Date [facility] Agency Contacted: 08/31/21 Time HH Agency Contacted: 1500 Representative spoke with at [place]: [place] ([place]) Interventions     Readmission Risk Interventions Readmission Risk Prevention Plan 08/24/2021 08/24/2021 11/01/2019  Transportation Screening Complete - -  PCP or Specialist Appt within 3-5 Days Complete Complete -  Home Care Screening - - Complete  Medication Review (RN CM) - - Complete  HRI or Home Care Consult Complete Complete -  Social Work Consult for [place] Planning/Counseling - Complete -  Palliative Care Screening Complete Complete -  Medication Review Press photographer) Complete Complete -  Some recent data might be hidden

## 2021-08-31 NOTE — Progress Notes (Signed)
Patient informed staff that landlord was available to unlock house for patient and patient ready for discharge.   Refused HS doxycycline despite Probation officer stressing importance of continuing antibiotic therapy for infection, stated that he would get it filled tomorrow.    Additionally, AC to floor to facilitate taxi pickup for patient.  AC and Probation officer both expressed concerns to patient about patient being able to transfer out of taxi and ambulate into house under his own power. Patient able to ambulate from bed to door of room using furniture and walls for support.   AC and Probation officer both agreed that there were significant concerns.  Patient called neighbor while in the room and neighbor agreed to meet him outside to help him in.  Taxi arrived at 2045 to take patient home.  Escorted down with PCT in wheelchair with belongings bag and discharge paperwork including new prescriptions.

## 2021-08-31 NOTE — Progress Notes (Signed)
Pt informed staff that he is missing his wallet and house key and therefore unable to get into his home. [facility] transport service temporarily cancelled. Charge RN updated and made aware of this situation.    No report of these belongings have been brought to the hospital are listed in the flowsheet.    ED and ICU departments called and searched, none of these items have been located. Oncoming RN and charge RN have been made aware. Pt's landlord is able to unlock his home so transport services will be called. Pt informed that the hospital staff will attempt to locate these items as best possible.

## 2021-08-31 NOTE — Evaluation (Signed)
Occupational Therapy Evaluation Patient Details Name: [name] MRN: [id number] DOB: 08/17/1986 Today's Date: 08/31/2021   History of Present Illness 35 yo male admitted with sepsis, hypotension after having fall at home. Hx of HIV, PTSD, schizoaffective d/o, Sz, L hip AVN, neuropathy, polysubstance abuse   Clinical Impression   Patient is currently requiring assistance with ADLs including minimal assist with toileting, with LE dressing, and with bathing, and setup/supervision assist to min guard with standing grooming and UE dressing.  Current level of function is slightly below patient's typical baseline, although pt endorses being "pretty close to normal".  During this evaluation, patient was limited by generalized weakness, impaired activity tolerance, and mild cognitive deficits with decreased awareness of impairments, as well as bowel incontinence, all of which has the potential to impact patient's safety and independence during functional mobility, as well as performance for ADLs.  Patient lives alone with and Aide M-F for 3 hours/day and family on the weekends.  Patient demonstrates good rehab potential, and should benefit from continued skilled occupational therapy services while in acute care to maximize safety, independence and quality of life at home.  Continued occupational therapy services in a SNF setting prior to return home is recommended, however pt is refusing this service and is amiable to home health instead.  ?     Recommendations for follow up therapy are one component of a multi-disciplinary discharge planning process, led by the attending physician.  Recommendations may be updated based on patient status, additional functional criteria and insurance authorization.   Follow Up Recommendations  Home health OT (Pt adamantly refusing SNF)    Assistance Recommended at Discharge Frequent or constant Supervision/Assistance  Functional Status Assessment  Patient has had  a recent decline in their functional status and demonstrates the ability to make significant improvements in function in a reasonable and predictable amount of time.  Equipment Recommendations  None recommended by OT    Recommendations for Other Services       Precautions / Restrictions Precautions Precautions: Fall Restrictions Weight Bearing Restrictions: No      Mobility Bed Mobility Overal bed mobility: Needs Assistance Bed Mobility: Supine to Sit;Sit to Supine     Supine to sit: Min assist;HOB elevated Sit to supine: Min assist;HOB elevated   General bed mobility comments: Assist for L LE. Increased time.    Transfers Overall transfer level: Needs assistance Equipment used: Rolling walker (2 wheels) Transfers: Sit to/from Stand Sit to Stand: Min assist           General transfer comment: Pt declined elevating bed. Stood to [employer] with Min As and increased effort on pt's part. Slow movements.      Balance Overall balance assessment: History of Falls;Needs assistance   Sitting balance-Leahy Scale: Good     Standing balance support: Bilateral upper extremity supported Standing balance-Leahy Scale: Poor Standing balance comment: Poor dynamic, fair static.                           ADL either performed or assessed with clinical judgement   ADL Overall ADL's : Needs assistance/impaired Eating/Feeding: Independent;Bed level   Grooming: Standing;Supervision/safety;Wash/dry hands Grooming Details (indicate cue type and reason): Verbal cue to initiate. Upper Body Bathing: Sitting;Set up   Lower Body Bathing: Minimal assistance;Sit to/from stand   Upper Body Dressing : Set up;Sitting   Lower Body Dressing: Minimal assistance;Sit to/from stand   Toilet Transfer: BSC/3in1;Rolling walker (2 wheels);Ambulation;Minimal assistance Armed forces technical officer  Details (indicate cue type and reason): Took ~3 steps with BSC with RW. Initially want to try for bathroom, but  had bowel urgency. Min As with [facility] transfers with cues for hand placement. Toileting- Clothing Manipulation and Hygiene: Minimal assistance;Sit to/from stand;Sitting/lateral lean Toileting - Clothing Manipulation Details (indicate cue type and reason): Pt able to perform posterior peri care with forward lean and need of Min As for thoroughness once standing.   Tub/Shower Transfer Details (indicate cue type and reason): Pt does not enter shower without his Aide present and uses tub transfer bench. Functional mobility during ADLs: Minimal assistance;Rolling walker (2 wheels)       Vision Baseline Vision/History: 0 No visual deficits Vision Assessment?: No apparent visual deficits     Perception Perception Perception: Within Functional Limits   Praxis Praxis Praxis: Intact    Pertinent Vitals/Pain Pain Assessment: No/denies pain (PT reports intermittent LT hip pain, but none presently.) Pain Intervention(s): Monitored during session     Hand Dominance Right   Extremity/Trunk Assessment Upper Extremity Assessment Upper Extremity Assessment: Generalized weakness   Lower Extremity Assessment Lower Extremity Assessment: Generalized weakness (Reports neuropathy to Bil feet)   Cervical / Trunk Assessment Cervical / Trunk Assessment: Normal   Communication Communication Communication:  (mild dysarthria)   Cognition Arousal/Alertness: Awake/alert Behavior During Therapy: WFL for tasks assessed/performed Overall Cognitive Status: Difficult to assess                                 General Comments: Pt with complex mental health history, 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  Speech is mildly dysarthric.     General Comments       Exercises     Shoulder Instructions      Home Living Family/patient expects to be discharged to:: Private residence Living Arrangements: Alone Available Help at Discharge: Family;Available PRN/intermittently;Personal care  attendant Type of Home: Apartment Home Access: Stairs to enter Entrance Stairs-Number of Steps: 3 Entrance Stairs-Rails: Right Home Layout: One level     Bathroom Shower/Tub: Teacher, early years/pre: Standard     Home Equipment: Conservation officer, nature (2 wheels);Cane - single point;Wheelchair - manual;Hand held shower head;Tub bench;BSC/3in1;Hospital bed   Additional Comments: Pt reports having a lift recliner.      Prior Functioning/Environment Prior Level of Function : Driving;History of Falls (last six months);Needs assist  Cognitive Assist : ADLs (cognitive)   ADLs (Cognitive): Set up cues Physical Assist : Mobility (physical);ADLs (physical) Mobility (physical): Bed mobility;Transfers;Gait ADLs (physical): Bathing;Dressing;Toileting;IADLs Mobility Comments: uses RW for ambulation, endorses 2 falls, one from tripping and the other pt does not recall reason. ADLs Comments: had aide a few hours during the week. Pt reports that his Aide does "everything" for him including assisting him out of bed (Family assists on Sat/Sun), assist with standing, and with dressing, bathing and toieting. Pt uses a urinal for bed level toileting at night when he is alone. Aide performs IADLs of cleaning,laundry and cooking. Pt reports that he does drive. 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        OT Problem List: Decreased strength;Pain;Decreased activity tolerance;Decreased safety awareness;Impaired balance (sitting and/or standing);Decreased knowledge of use of DME or AE;Decreased knowledge of precautions;Impaired sensation      OT Treatment/Interventions: Self-care/ADL training;Therapeutic exercise;Therapeutic activities;Cognitive remediation/compensation;DME and/or AE instruction;Patient/family education;Balance training;Energy  conservation    OT Goals(Current goals can be found in the care plan  section) Acute Rehab OT Goals Patient Stated Goal: To go home OT Goal Formulation: With patient Time For Goal Achievement: 09/14/21 Potential to Achieve Goals: Good ADL Goals Pt Will Perform Grooming: standing;with supervision (3/3 tasks) Pt Will Transfer to Toilet: with supervision;ambulating;bedside commode Pt Will Perform Toileting - Clothing Manipulation and hygiene: with supervision;sitting/lateral leans;sit to/from stand Pt/caregiver will Perform Home Exercise Program: Increased strength;Both right and left upper extremity;With theraband;With Supervision Additional ADL Goal #1: Patient will identify at least 3 fall prevention strategies to employ at home in order to maximize function and safety during ADLs and decrease caregiver burden while preventing possible injury and rehospitalization.  OT Frequency: Min 2X/week   Barriers to D/C: Decreased caregiver support  Alone at night but states that he does not get out of bed without someone present.       Co-evaluation              AM-PAC OT "6 Clicks" Daily Activity     Outcome Measure Help from another person eating meals?: None Help from another person taking care of personal grooming?: A Little Help from another person toileting, which includes using toliet, bedpan, or urinal?: A Little Help from another person bathing (including washing, rinsing, drying)?: A Little Help from another person to put on and taking off regular upper body clothing?: A Little Help from another person to put on and taking off regular lower body clothing?: A Little 6 Click Score: 19   End of Session Equipment Utilized During Treatment: Gait belt;Rolling walker (2 wheels) Nurse Communication: Mobility status (Output)  Activity Tolerance: Patient tolerated treatment well Patient left: in bed;with call bell/phone within reach;with bed alarm set;with nursing/sitter in room  OT Visit Diagnosis: Unsteadiness on feet (R26.81);History of falling  (Z91.81);Muscle weakness (generalized) (M62.81);Other symptoms and signs involving cognitive function;Pain Pain - Right/Left: Left Pain - part of body: Hip                Time: 0936-1020 OT Time Calculation (min): 44 min Charges:  OT General Charges $OT Visit: 1 Visit OT Evaluation $OT Eval Low Complexity: 1 Low OT Treatments $Self Care/Home Management : 8-22 mins $Therapeutic Activity: 8-22 mins  Anderson Malta, OT Acute Rehab Services Office: [phone] 08/31/2021 Julien Girt 08/31/2021, 10:34 AM

## 2021-10-06 ENCOUNTER — Emergency Department (HOSPITAL_COMMUNITY): Payer: Medicare HMO

## 2021-10-06 ENCOUNTER — Other Ambulatory Visit: Payer: Self-pay

## 2021-10-06 ENCOUNTER — Inpatient Hospital Stay (HOSPITAL_COMMUNITY)
Admission: EM | Admit: 2021-10-06 | Discharge: 2021-11-04 | DRG: 974 | Disposition: A | Discharge status: Home / Self Care | Payer: Medicare HMO | Attending: Internal Medicine | Admitting: Internal Medicine

## 2021-10-06 ENCOUNTER — Encounter (HOSPITAL_COMMUNITY): Payer: Self-pay

## 2021-10-06 DIAGNOSIS — F603 Borderline personality disorder: Secondary | ICD-10-CM | POA: Diagnosis present

## 2021-10-06 DIAGNOSIS — K7460 Unspecified cirrhosis of liver: Secondary | ICD-10-CM

## 2021-10-06 DIAGNOSIS — E440 Moderate protein-calorie malnutrition: Secondary | ICD-10-CM | POA: Insufficient documentation

## 2021-10-06 DIAGNOSIS — F319 Bipolar disorder, unspecified: Secondary | ICD-10-CM | POA: Diagnosis present

## 2021-10-06 DIAGNOSIS — Z79899 Other long term (current) drug therapy: Secondary | ICD-10-CM

## 2021-10-06 DIAGNOSIS — J189 Pneumonia, unspecified organism: Secondary | ICD-10-CM

## 2021-10-06 DIAGNOSIS — E876 Hypokalemia: Secondary | ICD-10-CM | POA: Diagnosis present

## 2021-10-06 DIAGNOSIS — G8929 Other chronic pain: Secondary | ICD-10-CM | POA: Diagnosis present

## 2021-10-06 DIAGNOSIS — L03317 Cellulitis of buttock: Secondary | ICD-10-CM | POA: Diagnosis present

## 2021-10-06 DIAGNOSIS — R54 Age-related physical debility: Secondary | ICD-10-CM | POA: Diagnosis present

## 2021-10-06 DIAGNOSIS — E162 Hypoglycemia, unspecified: Secondary | ICD-10-CM | POA: Diagnosis not present

## 2021-10-06 DIAGNOSIS — D6959 Other secondary thrombocytopenia: Secondary | ICD-10-CM | POA: Diagnosis present

## 2021-10-06 DIAGNOSIS — E870 Hyperosmolality and hypernatremia: Secondary | ICD-10-CM | POA: Diagnosis not present

## 2021-10-06 DIAGNOSIS — R7989 Other specified abnormal findings of blood chemistry: Secondary | ICD-10-CM | POA: Diagnosis present

## 2021-10-06 DIAGNOSIS — T68XXXA Hypothermia, initial encounter: Secondary | ICD-10-CM | POA: Diagnosis present

## 2021-10-06 DIAGNOSIS — R627 Adult failure to thrive: Secondary | ICD-10-CM | POA: Diagnosis present

## 2021-10-06 DIAGNOSIS — E8809 Other disorders of plasma-protein metabolism, not elsewhere classified: Secondary | ICD-10-CM | POA: Diagnosis not present

## 2021-10-06 DIAGNOSIS — G629 Polyneuropathy, unspecified: Secondary | ICD-10-CM | POA: Diagnosis present

## 2021-10-06 DIAGNOSIS — D539 Nutritional anemia, unspecified: Secondary | ICD-10-CM | POA: Diagnosis present

## 2021-10-06 DIAGNOSIS — Z515 Encounter for palliative care: Secondary | ICD-10-CM

## 2021-10-06 DIAGNOSIS — K7030 Alcoholic cirrhosis of liver without ascites: Secondary | ICD-10-CM | POA: Diagnosis present

## 2021-10-06 DIAGNOSIS — Z452 Encounter for adjustment and management of vascular access device: Secondary | ICD-10-CM

## 2021-10-06 DIAGNOSIS — F251 Schizoaffective disorder, depressive type: Secondary | ICD-10-CM | POA: Diagnosis present

## 2021-10-06 DIAGNOSIS — Z66 Do not resuscitate: Secondary | ICD-10-CM

## 2021-10-06 DIAGNOSIS — Z0189 Encounter for other specified special examinations: Secondary | ICD-10-CM

## 2021-10-06 DIAGNOSIS — Z21 Asymptomatic human immunodeficiency virus [HIV] infection status: Secondary | ICD-10-CM | POA: Diagnosis present

## 2021-10-06 DIAGNOSIS — A4151 Sepsis due to Escherichia coli [E. coli]: Principal | ICD-10-CM | POA: Diagnosis present

## 2021-10-06 DIAGNOSIS — I10 Essential (primary) hypertension: Secondary | ICD-10-CM | POA: Diagnosis present

## 2021-10-06 DIAGNOSIS — L97929 Non-pressure chronic ulcer of unspecified part of left lower leg with unspecified severity: Secondary | ICD-10-CM | POA: Diagnosis present

## 2021-10-06 DIAGNOSIS — E43 Unspecified severe protein-calorie malnutrition: Secondary | ICD-10-CM | POA: Diagnosis present

## 2021-10-06 DIAGNOSIS — G9340 Encephalopathy, unspecified: Secondary | ICD-10-CM | POA: Diagnosis present

## 2021-10-06 DIAGNOSIS — F1120 Opioid dependence, uncomplicated: Secondary | ICD-10-CM | POA: Diagnosis present

## 2021-10-06 DIAGNOSIS — J9601 Acute respiratory failure with hypoxia: Secondary | ICD-10-CM | POA: Diagnosis present

## 2021-10-06 DIAGNOSIS — R040 Epistaxis: Secondary | ICD-10-CM | POA: Diagnosis not present

## 2021-10-06 DIAGNOSIS — K7682 Hepatic encephalopathy: Secondary | ICD-10-CM | POA: Diagnosis present

## 2021-10-06 DIAGNOSIS — Z8249 Family history of ischemic heart disease and other diseases of the circulatory system: Secondary | ICD-10-CM

## 2021-10-06 DIAGNOSIS — E872 Acidosis, unspecified: Secondary | ICD-10-CM | POA: Diagnosis present

## 2021-10-06 DIAGNOSIS — R6521 Severe sepsis with septic shock: Secondary | ICD-10-CM | POA: Diagnosis present

## 2021-10-06 DIAGNOSIS — G40909 Epilepsy, unspecified, not intractable, without status epilepticus: Secondary | ICD-10-CM | POA: Diagnosis present

## 2021-10-06 DIAGNOSIS — J690 Pneumonitis due to inhalation of food and vomit: Secondary | ICD-10-CM | POA: Diagnosis not present

## 2021-10-06 DIAGNOSIS — L732 Hidradenitis suppurativa: Secondary | ICD-10-CM | POA: Diagnosis present

## 2021-10-06 DIAGNOSIS — Z818 Family history of other mental and behavioral disorders: Secondary | ICD-10-CM

## 2021-10-06 DIAGNOSIS — G9341 Metabolic encephalopathy: Secondary | ICD-10-CM | POA: Diagnosis present

## 2021-10-06 DIAGNOSIS — Z781 Physical restraint status: Secondary | ICD-10-CM

## 2021-10-06 DIAGNOSIS — Z6830 Body mass index (BMI) 30.0-30.9, adult: Secondary | ICD-10-CM

## 2021-10-06 DIAGNOSIS — B20 Human immunodeficiency virus [HIV] disease: Secondary | ICD-10-CM | POA: Diagnosis present

## 2021-10-06 DIAGNOSIS — R9431 Abnormal electrocardiogram [ECG] [EKG]: Secondary | ICD-10-CM | POA: Diagnosis present

## 2021-10-06 DIAGNOSIS — R739 Hyperglycemia, unspecified: Secondary | ICD-10-CM | POA: Diagnosis not present

## 2021-10-06 DIAGNOSIS — M87052 Idiopathic aseptic necrosis of left femur: Secondary | ICD-10-CM | POA: Diagnosis present

## 2021-10-06 DIAGNOSIS — F1020 Alcohol dependence, uncomplicated: Secondary | ICD-10-CM | POA: Diagnosis present

## 2021-10-06 DIAGNOSIS — K219 Gastro-esophageal reflux disease without esophagitis: Secondary | ICD-10-CM | POA: Diagnosis present

## 2021-10-06 DIAGNOSIS — F061 Catatonic disorder due to known physiological condition: Secondary | ICD-10-CM | POA: Diagnosis not present

## 2021-10-06 DIAGNOSIS — T17908A Unspecified foreign body in respiratory tract, part unspecified causing other injury, initial encounter: Secondary | ICD-10-CM

## 2021-10-06 DIAGNOSIS — K7031 Alcoholic cirrhosis of liver with ascites: Secondary | ICD-10-CM | POA: Diagnosis present

## 2021-10-06 DIAGNOSIS — R1310 Dysphagia, unspecified: Secondary | ICD-10-CM

## 2021-10-06 DIAGNOSIS — Z8616 Personal history of COVID-19: Secondary | ICD-10-CM

## 2021-10-06 DIAGNOSIS — E2740 Unspecified adrenocortical insufficiency: Secondary | ICD-10-CM | POA: Diagnosis not present

## 2021-10-06 DIAGNOSIS — N3080 Other cystitis without hematuria: Secondary | ICD-10-CM | POA: Diagnosis present

## 2021-10-06 DIAGNOSIS — F17210 Nicotine dependence, cigarettes, uncomplicated: Secondary | ICD-10-CM | POA: Diagnosis present

## 2021-10-06 DIAGNOSIS — E871 Hypo-osmolality and hyponatremia: Secondary | ICD-10-CM | POA: Diagnosis present

## 2021-10-06 DIAGNOSIS — Z9114 Patient's other noncompliance with medication regimen: Secondary | ICD-10-CM

## 2021-10-06 DIAGNOSIS — I9589 Other hypotension: Secondary | ICD-10-CM | POA: Diagnosis present

## 2021-10-06 DIAGNOSIS — A419 Sepsis, unspecified organism: Secondary | ICD-10-CM | POA: Diagnosis not present

## 2021-10-06 DIAGNOSIS — Z4659 Encounter for fitting and adjustment of other gastrointestinal appliance and device: Secondary | ICD-10-CM

## 2021-10-06 DIAGNOSIS — K760 Fatty (change of) liver, not elsewhere classified: Secondary | ICD-10-CM | POA: Diagnosis present

## 2021-10-06 DIAGNOSIS — M87852 Other osteonecrosis, left femur: Secondary | ICD-10-CM | POA: Diagnosis present

## 2021-10-06 DIAGNOSIS — D696 Thrombocytopenia, unspecified: Secondary | ICD-10-CM | POA: Diagnosis present

## 2021-10-06 DIAGNOSIS — D689 Coagulation defect, unspecified: Secondary | ICD-10-CM | POA: Diagnosis present

## 2021-10-06 DIAGNOSIS — F4310 Post-traumatic stress disorder, unspecified: Secondary | ICD-10-CM | POA: Diagnosis present

## 2021-10-06 DIAGNOSIS — E878 Other disorders of electrolyte and fluid balance, not elsewhere classified: Secondary | ICD-10-CM | POA: Diagnosis not present

## 2021-10-06 HISTORY — DX: Hypo-osmolality and hyponatremia: E87.1

## 2021-10-06 LAB — COMPREHENSIVE METABOLIC PANEL
ALT: 71 U/L — ABNORMAL HIGH (ref 0–44)
AST: 62 U/L — ABNORMAL HIGH (ref 15–41)
Albumin: <1.5 g/dL — ABNORMAL LOW (ref 3.5–5.0)
Alkaline Phosphatase: 314 U/L — ABNORMAL HIGH (ref 38–126)
Anion gap: 12 (ref 5–15)
BUN: <5 mg/dL — ABNORMAL LOW (ref 6–20)
CO2: 22 mmol/L (ref 22–32)
Calcium: 7.9 mg/dL — ABNORMAL LOW (ref 8.9–10.3)
Chloride: 98 mmol/L (ref 98–111)
Creatinine, Ser: 0.88 mg/dL (ref 0.61–1.24)
GFR, Estimated: >60 mL/min (ref >60)
Glucose, Bld: 94 mg/dL (ref 70–99)
Potassium: 2.7 mmol/L — CL (ref 3.5–5.1)
Sodium: 132 mmol/L — ABNORMAL LOW (ref 135–145)
Total Bilirubin: 1.6 mg/dL — ABNORMAL HIGH (ref 0.3–1.2)
Total Protein: 6.6 g/dL (ref 6.5–8.1)

## 2021-10-06 LAB — PROTIME-INR
INR: 1.6 — ABNORMAL HIGH (ref 0.8–1.2)
Prothrombin Time: 19.4 seconds — ABNORMAL HIGH (ref 11.4–15.2)

## 2021-10-06 LAB — LACTIC ACID, PLASMA
Lactic Acid, Venous: 6.4 mmol/L (ref 0.5–1.9)
Lactic Acid, Venous: 4.3 mmol/L (ref 0.5–1.9)

## 2021-10-06 LAB — APTT: aPTT: 41 seconds — ABNORMAL HIGH (ref 24–36)

## 2021-10-06 MED ORDER — PIPERACILLIN-TAZOBACTAM 3.375 G IVPB 30 MIN
3.3750 g | Freq: Once | INTRAVENOUS | Status: AC
  Administered 2021-10-06: 21:00:00 3.375 g via INTRAVENOUS
  Filled 2021-10-06: qty 50

## 2021-10-06 MED ORDER — SODIUM CHLORIDE 0.9 % IV BOLUS
1000.0000 mL | Freq: Once | INTRAVENOUS | Status: AC
  Administered 2021-10-06: 19:00:00 1000 mL via INTRAVENOUS

## 2021-10-06 MED ORDER — NOREPINEPHRINE 4 MG/250ML-% IV SOLN
0.0000 ug/min | INTRAVENOUS | Status: DC
  Administered 2021-10-06: 23:00:00 2 ug/min via INTRAVENOUS
  Filled 2021-10-06: qty 250

## 2021-10-06 MED ORDER — SODIUM CHLORIDE 0.9 % IV BOLUS
30.0000 mL/kg | Freq: Once | INTRAVENOUS | Status: AC
  Administered 2021-10-06: 21:00:00 3267 mL via INTRAVENOUS

## 2021-10-06 MED ORDER — POTASSIUM CHLORIDE CRYS ER 20 MEQ PO TBCR
40.0000 meq | EXTENDED_RELEASE_TABLET | Freq: Once | ORAL | Status: AC
  Administered 2021-10-06: 21:00:00 40 meq via ORAL
  Filled 2021-10-06: qty 2

## 2021-10-06 MED ORDER — IOHEXOL 350 MG/ML SOLN
80.0000 mL | Freq: Once | INTRAVENOUS | Status: AC | PRN
  Administered 2021-10-06: 20:00:00 80 mL via INTRAVENOUS

## 2021-10-06 MED ORDER — VANCOMYCIN HCL 2000 MG/400ML IV SOLN
2000.0000 mg | Freq: Once | INTRAVENOUS | Status: AC
  Administered 2021-10-06: 21:00:00 2000 mg via INTRAVENOUS
  Filled 2021-10-06: qty 400

## 2021-10-06 NOTE — Progress Notes (Signed)
A consult was received from an ED physician for vancomycin and piperacillin/tazobactam per pharmacy dosing.  The patient's profile has been reviewed for ht/wt/allergies/indication/available labs.    Allergies  Allergen Reactions   Dapsone Other (See Comments)    Per centricity "G6PD deficient"   Primaquine Phosphate Other (See Comments)    Per Centricity "G6PD deficient"    A one time order has been placed for piperacillin/tazobactam 3.375 g IV to be infused over 30 minutes + vancomycin 2000 mg IV once.    Further antibiotics/pharmacy consults should be ordered by admitting physician if indicated.                       Thank you, Lenis Noon, PharmD 10/06/2021  7:55 PM

## 2021-10-06 NOTE — ED Notes (Signed)
Patient transported to CT 

## 2021-10-06 NOTE — ED Notes (Signed)
Still awaiting pt urine sample via pure wick

## 2021-10-06 NOTE — ED Notes (Signed)
Pt in bed, pt cold to the touch, bilateral iv placed via ultrasound, blood and blood cultures drawn, pt rectal temp 92.5, bear hugger placed.

## 2021-10-06 NOTE — ED Notes (Signed)
Pt Bp trending downward even with fluids running. Last bp 84/47 with map of 59. Provider made aware of pt status at this time.

## 2021-10-06 NOTE — ED Notes (Signed)
Provider made aware of lab critical values lactic acid 6.4; k 2.7

## 2021-10-06 NOTE — ED Notes (Signed)
Pt refused in and out cath; male purewick placed. Provider notified by RN.

## 2021-10-06 NOTE — ED Provider Notes (Addendum)
[facility] DEPT Provider Note   CSN: [id number] Arrival date & time: 10/06/21  1755     History Chief Complaint  Patient presents with   Wound Check    [name] is a 35 y.o. male.  Patient brought in by EMS for pain and sores on his buttocks.  He states that he lives at home alone and home health comes and checks on him.  They evaluate him today were concerned about the wounds and advised him to go to the ER.  Additional history is very difficult obtained from the patient.  He is awake and appears alert but has a whispering voice and very difficult to understand what he saying.      Past Medical History:  Diagnosis Date   Bipolar 1 disorder ([place])    Depression    Dizziness and giddiness 02/01/2016   Herpes genitalia    HIV disease ([place])    Hypertension    Migraine headache 02/01/2016   Peripheral neuropathy 10/01/2019   PTSD (post-traumatic stress disorder)    Schizoaffective disorder ([place])    Seizures ([place])     Patient Active Problem List   Diagnosis Date Noted   Hypotension    Hypothermia 08/24/2021   Hypoxia 08/24/2021   Severe sepsis ([place]) 08/23/2021   Left hip pain    Transaminitis    Acute metabolic encephalopathy 62/94/7654   Cellulitis 05/07/2021   Lymphadenopathy 09/29/2020   Anemia    Upper urinary tract infection    Sepsis secondary to UTI ([place]) 03/14/2020   Cellulitis of groin 03/14/2020   Elevated LFTs 03/14/2020   HIV disease ([place])    Acute encephalopathy    Hyponatremia    Macrocytic anemia    Thrombocytopenia ([facility])    Prolonged QT interval    Acute respiratory failure due to COVID-19 ([place]) 10/27/2019   GERD (gastroesophageal reflux disease) 10/27/2019   Hypokalemia 10/27/2019   Peripheral neuropathy 10/01/2019   PTSD (post-traumatic stress disorder) 07/23/2018   Dizziness and giddiness 02/01/2016   Migraine headache 02/01/2016   Schizoaffective disorder, depressive type ([place]) 05/13/2015   Severe  alcohol dependence ([place]) 05/13/2015   Suicidal ideation 01/12/2014    Past Surgical History:  Procedure Laterality Date   BACK SURGERY     BIOPSY  02/26/2021   Procedure: BIOPSY;  Surgeon: Otis Brace, MD;  Location: [facility];  Service: Gastroenterology;;   COLONOSCOPY WITH PROPOFOL [name] 02/26/2021   Procedure: COLONOSCOPY WITH PROPOFOL;  Surgeon: Otis Brace, MD;  Location: [facility];  Service: Gastroenterology;  Laterality: [name];   ESOPHAGOGASTRODUODENOSCOPY (EGD) WITH PROPOFOL [name] 02/26/2021   Procedure: ESOPHAGOGASTRODUODENOSCOPY (EGD) WITH PROPOFOL;  Surgeon: Otis Brace, MD;  Location: [facility];  Service: Gastroenterology;  Laterality: [name];   ESOPHAGOGASTRODUODENOSCOPY (EGD) WITH PROPOFOL [name] 05/18/2021   Procedure: ESOPHAGOGASTRODUODENOSCOPY (EGD) WITH PROPOFOL;  Surgeon: Otis Brace, MD;  Location: [facility];  Service: Gastroenterology;  Laterality: [name];   HAND SURGERY         Family History  Problem Relation Age of Onset   Alcohol abuse Mother    Schizophrenia Father    Depression Father    Alcohol abuse Father    Alcohol abuse Paternal Uncle    Alcohol abuse Paternal Uncle     Social History   Tobacco Use   Smoking status: Every Day    Packs/day: 1.00    Types: Cigarettes   Smokeless tobacco: Never  Vaping Use   Vaping Use: Never used  Substance Use Topics   Alcohol  use: Yes    Alcohol/week: 14.0 standard drinks    Types: 14 Cans of beer per week    Comment: 5-6 40oz per day   Drug use: No    Home Medications Prior to Admission medications   Medication Sig Start Date End Date Taking? Authorizing Provider  albuterol (PROVENTIL HFA;VENTOLIN HFA) 108 (90 BASE) MCG/ACT inhaler Inhale 2 puffs into the lungs every 6 (six) hours as needed for wheezing or shortness of breath.    [provider]  alprazolam Duanne Moron) 2 MG tablet Take 2 mg by mouth 3 (three) times daily.    [provider]  benzonatate (TESSALON) 100  MG capsule Take 200 mg by mouth every 8 (eight) hours as needed for cough. 10/23/19   [provider]  bictegravir-emtricitabine-tenofovir AF (BIKTARVY) 50-200-25 MG TABS tablet Take 1 tablet by mouth daily.  02/24/17   [provider]  cyclobenzaprine (FLEXERIL) 10 MG tablet Take 10 mg by mouth 3 (three) times daily as needed for muscle spasms. 08/19/21   [provider]  diphenoxylate-atropine (LOMOTIL) 2.5-0.025 MG tablet Take 1 tablet by mouth every 8 (eight) hours as needed for diarrhea or loose stools. 02/16/21   [provider]  doxycycline (VIBRA-TABS) 100 MG tablet Take 1 tablet (100 mg total) by mouth every 12 (twelve) hours. 08/31/21 10/30/21  Raiford Noble Latif, DO  escitalopram (LEXAPRO) 20 MG tablet Take 1 tablet by mouth daily. 02/16/21   [provider]  famotidine (PEPCID) 40 MG tablet Take 40 mg by mouth every morning. 08/19/21   [provider]  folic acid (FOLVITE) 1 MG tablet Take 1 mg by mouth daily. 10/03/19   [provider]  food thickener (SIMPLYTHICK, NECTAR/LEVEL 2/MILDLY THICK,) GEL Take 1 packet by mouth as needed. 08/31/21   Raiford Noble Latif, DO  furosemide (LASIX) 40 MG tablet Take 40 mg by mouth daily as needed for fluid. 02/27/20   [provider]  haloperidol (HALDOL) 5 MG tablet Take 5 mg by mouth 2 (two) times daily. 08/19/21   [provider]  HYDROcodone-acetaminophen (NORCO/VICODIN) 5-325 MG tablet Take 1 tablet by mouth 2 (two) times daily as needed. 08/09/21   [provider]  ibuprofen (ADVIL) 800 MG tablet Take 800 mg by mouth 3 (three) times daily. 09/09/21   [provider]  lactulose (CHRONULAC) 10 GM/15ML solution Take 30 mLs (20 g total) by mouth 2 (two) times daily. 08/31/21   Raiford Noble Latif, DO  lurasidone 120 MG TABS Take 1 tablet (120 mg total) by mouth daily. 05/15/15   Niel Hummer, NP  magnesium oxide (MAG-OX) 400 (240 Mg) MG tablet Take 1 tablet  (400 mg total) by mouth daily. 05/19/21   Geradine Girt, DO  meclizine (ANTIVERT) 25 MG tablet Take 25 mg by mouth every 8 (eight) hours as needed. 06/18/21   [provider]  midodrine (PROAMATINE) 10 MG tablet Take 1 tablet (10 mg total) by mouth 3 (three) times daily with meals. 08/31/21   Raiford Noble Latif, DO  Multiple Vitamin (MULTIVITAMIN WITH MINERALS) TABS tablet Take 1 tablet by mouth daily. 09/01/21   Sheikh, Omair Latif, DO  nicotine (NICODERM CQ - DOSED IN MG/24 HOURS) 14 mg/24hr patch Place 1 patch (14 mg total) onto the skin daily. 09/01/21   Raiford Noble Latif, DO  Nutritional Supplements (,FEEDING SUPPLEMENT, PROSOURCE PLUS) liquid Take 30 mLs by mouth 4 (four) times daily. 08/31/21   Raiford Noble Latif, DO  ondansetron (ZOFRAN) 8 MG tablet  Take 8 mg by mouth every 8 (eight) hours as needed for nausea/vomiting, nausea or vomiting. 10/03/19   [provider]  pantoprazole (PROTONIX) 40 MG tablet Take 1 tablet (40 mg total) by mouth daily. 05/20/21   Geradine Girt, DO  thiamine 100 MG tablet Take 2.5 tablets (250 mg total) by mouth daily. 05/20/21   Geradine Girt, DO  thiamine 100 MG tablet Take 1 tablet (100 mg total) by mouth daily. 09/01/21   Raiford Noble Latif, DO  topiramate (TOPAMAX) 100 MG tablet Take 100 mg by mouth at bedtime. 06/18/21   [provider]  traZODone (DESYREL) 100 MG tablet Take 2 tablets (200 mg total) by mouth at bedtime as needed for sleep. 05/19/21   Geradine Girt, DO  traZODone (DESYREL) 50 MG tablet Take 200 mg by mouth at bedtime. 08/19/21   [provider]  valACYclovir (VALTREX) 1000 MG tablet Take 1 tablet (1,000 mg total) by mouth daily. 05/15/15   Niel Hummer, NP    Allergies    Dapsone and Primaquine phosphate  Review of Systems   Review of Systems  Unable to perform ROS: Other   Physical Exam Updated Vital Signs BP (!) 93/53    Pulse 91    Temp (!) 94 F (34.4 C) (Rectal)    Resp 17    Ht 6' 3"   (1.905 m)    Wt 108.9 kg    SpO2 98%    BMI 30.00 kg/m   Physical Exam Constitutional:      Appearance: He is well-developed.  HENT:     Head: Normocephalic.     Nose: Nose normal.  Eyes:     Extraocular Movements: Extraocular movements intact.  Cardiovascular:     Rate and Rhythm: Normal rate.  Pulmonary:     Effort: Pulmonary effort is normal.  Skin:    Coloration: Skin is not jaundiced.     Comments: Extensive small ulcerative lesions and excoriations along his buttocks.  Neurological:     Mental Status: He is alert. Mental status is at baseline.    ED Results / Procedures / Treatments   Labs (all labs ordered are listed, but only abnormal results are displayed) Labs Reviewed  LACTIC ACID, PLASMA - Abnormal; Notable for the following components:      Result Value   Lactic Acid, Venous 6.4 (*)    All other components within normal limits  LACTIC ACID, PLASMA - Abnormal; Notable for the following components:   Lactic Acid, Venous 4.3 (*)    All other components within normal limits  COMPREHENSIVE METABOLIC PANEL - Abnormal; Notable for the following components:   Sodium 132 (*)    Potassium 2.7 (*)    BUN <5 (*)    Calcium 7.9 (*)    Albumin <1.5 (*)    AST 62 (*)    ALT 71 (*)    Alkaline Phosphatase 314 (*)    Total Bilirubin 1.6 (*)    All other components within normal limits  CBC WITH DIFFERENTIAL/PLATELET - Abnormal; Notable for the following components:   WBC 11.5 (*)    RBC 2.31 (*)    Hemoglobin 7.4 (*)    HCT 23.1 (*)    RDW 18.2 (*)    Platelets 17 (*)    Neutro Abs 8.8 (*)    Abs Immature Granulocytes 0.08 (*)    All other components within normal limits  PROTIME-INR - Abnormal; Notable for the following components:   Prothrombin  Time 19.4 (*)    INR 1.6 (*)    All other components within normal limits  APTT - Abnormal; Notable for the following components:   aPTT 41 (*)    All other components within normal limits  CULTURE, BLOOD (ROUTINE X  2)  CULTURE, BLOOD (ROUTINE X 2)  URINE CULTURE  RESP PANEL BY RT-PCR (FLU A&B, COVID) ARPGX2  URINALYSIS, ROUTINE W REFLEX MICROSCOPIC  ETHANOL    EKG None  Radiology CT Abdomen Pelvis W Contrast  Result Date: 10/06/2021 CLINICAL DATA:  Wound on buttocks, sepsis EXAM: CT ABDOMEN AND PELVIS WITH CONTRAST TECHNIQUE: Multidetector CT imaging of the abdomen and pelvis was performed using the standard protocol following bolus administration of intravenous contrast. CONTRAST:  37m OMNIPAQUE IOHEXOL 350 MG/ML SOLN COMPARISON:  09/10/2020 FINDINGS: Lower chest: No acute pleural or parenchymal lung disease. Hepatobiliary: Diffuse hepatic steatosis. No focal liver abnormality. The gallbladder is moderately distended without evidence of cholelithiasis or cholecystitis. Pancreas: Unremarkable. No pancreatic ductal dilatation or surrounding inflammatory changes. Spleen: Normal in size without focal abnormality. Adrenals/Urinary Tract: There is gas within the bladder lumen, as well as a small amount of intramural gas seen dependently within the bladder. Findings could reflect emphysematous cystitis or recent instrumentation of the bladder. No bladder wall thickening or perivesicular fat stranding. There is a small amount of gas within the bilateral renal collecting systems, right greater than left, likely related to the gas in the bladder described above. The kidneys enhance normally and symmetrically, with no other findings to suggest infection or pyelonephritis. No urinary tract calculi or obstructive uropathy. The adrenals are unremarkable. Stomach/Bowel: No bowel obstruction or ileus. Normal appendix right lower quadrant. No bowel wall thickening or inflammatory change. Vascular/Lymphatic: Borderline enlarged lymph nodes are seen within the bilateral inguinal regions and pelvis. Largest lymph node in the right external iliac chain measures up to 18 mm in short axis. Smaller subcentimeter lymph nodes are seen  within the retroperitoneum. These may be reactive. No significant vascular findings. Reproductive: Prostate is unremarkable. Other: No free fluid or free intraperitoneal gas. Fat containing umbilical hernia. No bowel herniation. Musculoskeletal: Subcutaneous edema is seen dependently within the suprapubic region, bilateral flanks and buttocks. Small foci of subcutaneous gas within the right buttock could be related to infection or recent injection. No fluid collection or abscess. There are no acute or destructive bony lesions. 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 IMPRESSION: 1. Abnormal gas within the bilateral renal collecting systems, urinary bladder, and dependent bladder wall. Given the lack of associated inflammatory changes within the kidneys or bladder, I would favor recent instrumentation over acute urinary tract infection and emphysematous cystitis. Please correlate with procedural history. 2. Subcutaneous edema within the suprapubic region, bilateral flanks, and buttocks, with small foci of subcutaneous gas in the right buttock as above. Findings could reflect cellulitis. No fluid collection or abscess. 3. Chronic retroperitoneal and pelvic adenopathy. 4. 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 5. Hepatic steatosis. 6. Fat containing umbilical hernia. Electronically Signed   By: MRanda NgoM.D.   On: 10/06/2021 20:41   DG Chest Port 1 View  Result Date: 10/06/2021 CLINICAL DATA:  Possible sepsis EXAM: PORTABLE CHEST 1 VIEW COMPARISON:  08/23/2021 FINDINGS: The heart size and mediastinal contours are within normal limits. Both lungs are clear. The visualized skeletal structures are unremarkable. IMPRESSION: No active disease. Electronically Signed   By: PPrudy FeelerD.  On: 10/06/2021 18:53    Procedures .Critical Care Performed by: Luna Fuse, MD Authorized by: Luna Fuse, MD   Critical care provider statement:    Critical care time (minutes):  40   Critical care time was exclusive of:  Separately billable procedures and treating other patients and teaching time   Critical care was necessary to treat or prevent imminent or life-threatening deterioration of the following conditions:  Sepsis and shock .Central Line  Date/Time: 10/07/2021 12:20 AM Performed by: Luna Fuse, MD Authorized by: Luna Fuse, MD   Consent:    Consent obtained:  Verbal Comments:     Right-sided central line placed by myself with ultrasound guidance and Seldinger technique.  Maximal sterile barrier technique applied.  Patient tolerated procedure well.  There was nonpulsatile dark appearing blood return from the catheter.  It was secured with a Biopatch sutured in place and a Tegaderm dressing placed on top.  Good return of blood from all 3 ports noted.   Medications Ordered in ED Medications  norepinephrine (LEVOPHED) 77m in 2547m(0.016 mg/mL) premix infusion (4 mcg/min Intravenous Rate/Dose Change 10/07/21 0001)  sodium chloride 0.9 % bolus 1,000 mL (0 mLs Intravenous Stopped 10/06/21 2108)  piperacillin-tazobactam (ZOSYN) IVPB 3.375 g (0 g Intravenous Stopped 10/06/21 2238)  vancomycin (VANCOREADY) IVPB 2000 mg/400 mL (0 mg Intravenous Stopped 10/06/21 2350)  iohexol (OMNIPAQUE) 350 MG/ML injection 80 mL (80 mLs Intravenous Contrast Given 10/06/21 2018)  potassium chloride SA (KLOR-CON M) CR tablet 40 mEq (40 mEq Oral Given 10/06/21 2104)  sodium chloride 0.9 % bolus 3,267 mL (3,267 mLs Intravenous New Bag/Given 10/06/21 2101)    ED Course  I have reviewed the triage vital signs and the nursing notes.  Pertinent labs & imaging results that were available during my care of the patient were reviewed by me and considered in my medical decision making (see chart for details).    MDM Rules/Calculators/A&P                          Patient presents with low blood pressure concerning for sepsis.  Lactic acid elevated at 6, white count 11.5.  Potassium low 2.7 as well platelets of 17.  Patient was recently mated last month for similar episodes of sepsis with multifactorial causes per discharge note.  Patient noted to be severely hypothermic and hypotensive here in the ER.  Patient started on broad-spectrum antibiotics Zosyn and vancomycin.  Given 30 cc/kg IV fluid resuscitation blood pressure still remains labile at about 8067-61ystolic.  Given additional 1 L bolus and Levophed started IV.  Consultation with intensivist requested.     Final Clinical Impression(s) / ED Diagnoses Final [facility]:  [facility]   Rx / DC Orders ED Discharge Orders     None        HoLuna FuseMD 10/06/21 2241    HoLuna FuseMD 10/07/21 [phone]

## 2021-10-06 NOTE — ED Triage Notes (Signed)
Pt to er via ems, per ems pt is here for some sore on his buttocks, states that he lives at home and home health comes to see him, home health thought the wounds might be infected so they sent him to the er.  States that pt is more tired than normal

## 2021-10-06 NOTE — ED Notes (Signed)
Pt temp rectally 93.2, fluid warmer started in efforts to assist in raising temp in conjunction with the bear hugger. Will continue to monitor.

## 2021-10-07 ENCOUNTER — Inpatient Hospital Stay (HOSPITAL_COMMUNITY): Payer: Medicare HMO

## 2021-10-07 ENCOUNTER — Emergency Department (HOSPITAL_COMMUNITY): Payer: Medicare HMO

## 2021-10-07 DIAGNOSIS — E872 Acidosis, unspecified: Secondary | ICD-10-CM | POA: Diagnosis present

## 2021-10-07 DIAGNOSIS — L03317 Cellulitis of buttock: Secondary | ICD-10-CM | POA: Diagnosis present

## 2021-10-07 DIAGNOSIS — R6521 Severe sepsis with septic shock: Secondary | ICD-10-CM | POA: Diagnosis not present

## 2021-10-07 DIAGNOSIS — R7989 Other specified abnormal findings of blood chemistry: Secondary | ICD-10-CM | POA: Diagnosis not present

## 2021-10-07 DIAGNOSIS — A419 Sepsis, unspecified organism: Secondary | ICD-10-CM

## 2021-10-07 DIAGNOSIS — K7030 Alcoholic cirrhosis of liver without ascites: Secondary | ICD-10-CM

## 2021-10-07 DIAGNOSIS — R451 Restlessness and agitation: Secondary | ICD-10-CM | POA: Diagnosis not present

## 2021-10-07 DIAGNOSIS — J690 Pneumonitis due to inhalation of food and vomit: Secondary | ICD-10-CM | POA: Diagnosis not present

## 2021-10-07 DIAGNOSIS — M87852 Other osteonecrosis, left femur: Secondary | ICD-10-CM | POA: Diagnosis present

## 2021-10-07 DIAGNOSIS — F32A Depression, unspecified: Secondary | ICD-10-CM | POA: Diagnosis not present

## 2021-10-07 DIAGNOSIS — F251 Schizoaffective disorder, depressive type: Secondary | ICD-10-CM | POA: Diagnosis not present

## 2021-10-07 DIAGNOSIS — E870 Hyperosmolality and hypernatremia: Secondary | ICD-10-CM | POA: Diagnosis not present

## 2021-10-07 DIAGNOSIS — A4151 Sepsis due to Escherichia coli [E. coli]: Secondary | ICD-10-CM | POA: Diagnosis present

## 2021-10-07 DIAGNOSIS — F1020 Alcohol dependence, uncomplicated: Secondary | ICD-10-CM

## 2021-10-07 DIAGNOSIS — D696 Thrombocytopenia, unspecified: Secondary | ICD-10-CM | POA: Diagnosis not present

## 2021-10-07 DIAGNOSIS — D539 Nutritional anemia, unspecified: Secondary | ICD-10-CM

## 2021-10-07 DIAGNOSIS — F1120 Opioid dependence, uncomplicated: Secondary | ICD-10-CM | POA: Diagnosis present

## 2021-10-07 DIAGNOSIS — D7589 Other specified diseases of blood and blood-forming organs: Secondary | ICD-10-CM

## 2021-10-07 DIAGNOSIS — G9340 Encephalopathy, unspecified: Secondary | ICD-10-CM | POA: Diagnosis not present

## 2021-10-07 DIAGNOSIS — Z6830 Body mass index (BMI) 30.0-30.9, adult: Secondary | ICD-10-CM | POA: Diagnosis not present

## 2021-10-07 DIAGNOSIS — J9601 Acute respiratory failure with hypoxia: Secondary | ICD-10-CM | POA: Diagnosis present

## 2021-10-07 DIAGNOSIS — Z66 Do not resuscitate: Secondary | ICD-10-CM | POA: Diagnosis not present

## 2021-10-07 DIAGNOSIS — M87052 Idiopathic aseptic necrosis of left femur: Secondary | ICD-10-CM | POA: Diagnosis present

## 2021-10-07 DIAGNOSIS — T68XXXA Hypothermia, initial encounter: Secondary | ICD-10-CM

## 2021-10-07 DIAGNOSIS — Z21 Asymptomatic human immunodeficiency virus [HIV] infection status: Secondary | ICD-10-CM

## 2021-10-07 DIAGNOSIS — E871 Hypo-osmolality and hyponatremia: Secondary | ICD-10-CM

## 2021-10-07 DIAGNOSIS — G9341 Metabolic encephalopathy: Secondary | ICD-10-CM | POA: Diagnosis present

## 2021-10-07 DIAGNOSIS — E876 Hypokalemia: Secondary | ICD-10-CM | POA: Diagnosis not present

## 2021-10-07 DIAGNOSIS — D689 Coagulation defect, unspecified: Secondary | ICD-10-CM | POA: Diagnosis present

## 2021-10-07 DIAGNOSIS — B20 Human immunodeficiency virus [HIV] disease: Secondary | ICD-10-CM | POA: Diagnosis present

## 2021-10-07 DIAGNOSIS — N390 Urinary tract infection, site not specified: Secondary | ICD-10-CM | POA: Diagnosis not present

## 2021-10-07 DIAGNOSIS — E43 Unspecified severe protein-calorie malnutrition: Secondary | ICD-10-CM | POA: Diagnosis present

## 2021-10-07 DIAGNOSIS — R1310 Dysphagia, unspecified: Secondary | ICD-10-CM | POA: Diagnosis not present

## 2021-10-07 DIAGNOSIS — K7682 Hepatic encephalopathy: Secondary | ICD-10-CM | POA: Diagnosis not present

## 2021-10-07 DIAGNOSIS — F4310 Post-traumatic stress disorder, unspecified: Secondary | ICD-10-CM | POA: Diagnosis not present

## 2021-10-07 DIAGNOSIS — G40909 Epilepsy, unspecified, not intractable, without status epilepticus: Secondary | ICD-10-CM | POA: Diagnosis present

## 2021-10-07 DIAGNOSIS — R9431 Abnormal electrocardiogram [ECG] [EKG]: Secondary | ICD-10-CM

## 2021-10-07 DIAGNOSIS — T17908A Unspecified foreign body in respiratory tract, part unspecified causing other injury, initial encounter: Secondary | ICD-10-CM | POA: Diagnosis not present

## 2021-10-07 DIAGNOSIS — K760 Fatty (change of) liver, not elsewhere classified: Secondary | ICD-10-CM | POA: Diagnosis present

## 2021-10-07 DIAGNOSIS — R609 Edema, unspecified: Secondary | ICD-10-CM

## 2021-10-07 DIAGNOSIS — I10 Essential (primary) hypertension: Secondary | ICD-10-CM | POA: Diagnosis present

## 2021-10-07 DIAGNOSIS — F319 Bipolar disorder, unspecified: Secondary | ICD-10-CM | POA: Diagnosis present

## 2021-10-07 DIAGNOSIS — E2740 Unspecified adrenocortical insufficiency: Secondary | ICD-10-CM | POA: Diagnosis not present

## 2021-10-07 DIAGNOSIS — Z8616 Personal history of COVID-19: Secondary | ICD-10-CM | POA: Diagnosis not present

## 2021-10-07 DIAGNOSIS — Z7189 Other specified counseling: Secondary | ICD-10-CM | POA: Diagnosis not present

## 2021-10-07 DIAGNOSIS — R627 Adult failure to thrive: Secondary | ICD-10-CM | POA: Diagnosis not present

## 2021-10-07 DIAGNOSIS — R410 Disorientation, unspecified: Secondary | ICD-10-CM | POA: Diagnosis not present

## 2021-10-07 DIAGNOSIS — Z515 Encounter for palliative care: Secondary | ICD-10-CM | POA: Diagnosis not present

## 2021-10-07 LAB — URINALYSIS, ROUTINE W REFLEX MICROSCOPIC
Bilirubin Urine: NEGATIVE
Glucose, UA: NEGATIVE mg/dL
Ketones, ur: NEGATIVE mg/dL
Nitrite: NEGATIVE
Protein, ur: NEGATIVE mg/dL
Specific Gravity, Urine: 1.012 (ref 1.005–1.030)
pH: 6 (ref 5.0–8.0)

## 2021-10-07 LAB — LACTIC ACID, PLASMA
Lactic Acid, Venous: 2.4 mmol/L (ref 0.5–1.9)
Lactic Acid, Venous: 2.9 mmol/L (ref 0.5–1.9)
Lactic Acid, Venous: 2.8 mmol/L (ref 0.5–1.9)

## 2021-10-07 LAB — CBC WITH DIFFERENTIAL/PLATELET
Abs Immature Granulocytes: 0.09 10*3/uL — ABNORMAL HIGH (ref 0.00–0.07)
Basophils Absolute: 0.1 10*3/uL (ref 0.0–0.1)
Basophils Relative: 0 %
Eosinophils Absolute: 0.1 10*3/uL (ref 0.0–0.5)
Eosinophils Relative: 0 %
HCT: 22.7 % — ABNORMAL LOW (ref 39.0–52.0)
Hemoglobin: 7.4 g/dL — ABNORMAL LOW (ref 13.0–17.0)
Immature Granulocytes: 1 %
Lymphocytes Relative: 20 %
Lymphs Abs: 3 10*3/uL (ref 0.7–4.0)
MCH: 31.6 pg (ref 26.0–34.0)
MCHC: 32.6 g/dL (ref 30.0–36.0)
MCV: 97 fL (ref 80.0–100.0)
Monocytes Absolute: 0.9 10*3/uL (ref 0.1–1.0)
Monocytes Relative: 6 %
Neutro Abs: 11.1 10*3/uL — ABNORMAL HIGH (ref 1.7–7.7)
Neutrophils Relative %: 73 %
Platelets: 58 10*3/uL — ABNORMAL LOW (ref 150–400)
RBC: 2.34 MIL/uL — ABNORMAL LOW (ref 4.22–5.81)
RDW: 16.9 % — ABNORMAL HIGH (ref 11.5–15.5)
WBC: 15.2 10*3/uL — ABNORMAL HIGH (ref 4.0–10.5)
nRBC: 0 % (ref 0.0–0.2)

## 2021-10-07 LAB — COMPREHENSIVE METABOLIC PANEL
ALT: 53 U/L — ABNORMAL HIGH (ref 0–44)
AST: 48 U/L — ABNORMAL HIGH (ref 15–41)
Albumin: <1.5 g/dL — ABNORMAL LOW (ref 3.5–5.0)
Alkaline Phosphatase: 251 U/L — ABNORMAL HIGH (ref 38–126)
Anion gap: 6 (ref 5–15)
BUN: <5 mg/dL — ABNORMAL LOW (ref 6–20)
CO2: 25 mmol/L (ref 22–32)
Calcium: 7 mg/dL — ABNORMAL LOW (ref 8.9–10.3)
Chloride: 105 mmol/L (ref 98–111)
Creatinine, Ser: 0.78 mg/dL (ref 0.61–1.24)
GFR, Estimated: >60 mL/min (ref >60)
Glucose, Bld: 88 mg/dL (ref 70–99)
Potassium: 3.1 mmol/L — ABNORMAL LOW (ref 3.5–5.1)
Sodium: 136 mmol/L (ref 135–145)
Total Bilirubin: 1.4 mg/dL — ABNORMAL HIGH (ref 0.3–1.2)
Total Protein: 5.1 g/dL — ABNORMAL LOW (ref 6.5–8.1)

## 2021-10-07 LAB — CBC
HCT: 17.7 % — ABNORMAL LOW (ref 39.0–52.0)
Hemoglobin: 5.8 g/dL — CL (ref 13.0–17.0)
MCH: 32.2 pg (ref 26.0–34.0)
MCHC: 32.8 g/dL (ref 30.0–36.0)
MCV: 98.3 fL (ref 80.0–100.0)
Platelets: 20 10*3/uL — CL (ref 150–400)
RBC: 1.8 MIL/uL — ABNORMAL LOW (ref 4.22–5.81)
RDW: 18.2 % — ABNORMAL HIGH (ref 11.5–15.5)
WBC: 11.4 10*3/uL — ABNORMAL HIGH (ref 4.0–10.5)
nRBC: 0 % (ref 0.0–0.2)

## 2021-10-07 LAB — LACTATE DEHYDROGENASE: LDH: 105 U/L (ref 98–192)

## 2021-10-07 LAB — PROCALCITONIN: Procalcitonin: 0.49 ng/mL

## 2021-10-07 LAB — DIC (DISSEMINATED INTRAVASCULAR COAGULATION)PANEL
D-Dimer, Quant: 1.09 ug/mL-FEU — ABNORMAL HIGH (ref 0.00–0.50)
D-Dimer, Quant: 1.14 ug/mL-FEU — ABNORMAL HIGH (ref 0.00–0.50)
Fibrinogen: 165 mg/dL — ABNORMAL LOW (ref 210–475)
Fibrinogen: 224 mg/dL (ref 210–475)
INR: 1.7 — ABNORMAL HIGH (ref 0.8–1.2)
INR: 1.6 — ABNORMAL HIGH (ref 0.8–1.2)
Platelets: 21 10*3/uL — CL (ref 150–400)
Platelets: 60 10*3/uL — ABNORMAL LOW (ref 150–400)
Prothrombin Time: 20 seconds — ABNORMAL HIGH (ref 11.4–15.2)
Prothrombin Time: 19.1 seconds — ABNORMAL HIGH (ref 11.4–15.2)
Smear Review: NONE SEEN
Smear Review: NONE SEEN
aPTT: 43 seconds — ABNORMAL HIGH (ref 24–36)
aPTT: 37 seconds — ABNORMAL HIGH (ref 24–36)

## 2021-10-07 LAB — PROTIME-INR
INR: 1.5 — ABNORMAL HIGH (ref 0.8–1.2)
Prothrombin Time: 18.5 seconds — ABNORMAL HIGH (ref 11.4–15.2)

## 2021-10-07 LAB — POC OCCULT BLOOD, ED: Fecal Occult Bld: POSITIVE — AB

## 2021-10-07 LAB — RESP PANEL BY RT-PCR (FLU A&B, COVID) ARPGX2
Influenza A by PCR: NEGATIVE
Influenza B by PCR: NEGATIVE
SARS Coronavirus 2 by RT PCR: NEGATIVE

## 2021-10-07 LAB — CORTISOL
Cortisol, Plasma: 8.2 ug/dL
Cortisol, Plasma: 10 ug/dL

## 2021-10-07 LAB — MRSA NEXT GEN BY PCR, NASAL: MRSA by PCR Next Gen: DETECTED — AB

## 2021-10-07 LAB — MAGNESIUM
Magnesium: 1.8 mg/dL (ref 1.7–2.4)
Magnesium: 1.7 mg/dL (ref 1.7–2.4)

## 2021-10-07 LAB — PREPARE RBC (CROSSMATCH)

## 2021-10-07 LAB — ETHANOL: Alcohol, Ethyl (B): <10 mg/dL (ref <10)

## 2021-10-07 MED ORDER — SODIUM CHLORIDE 0.9 % IV SOLN
2.0000 g | Freq: Three times a day (TID) | INTRAVENOUS | Status: DC
  Administered 2021-10-07 – 2021-10-09 (×6): 2 g via INTRAVENOUS
  Filled 2021-10-07 (×7): qty 2

## 2021-10-07 MED ORDER — MAGNESIUM OXIDE -MG SUPPLEMENT 400 (240 MG) MG PO TABS
400.0000 mg | ORAL_TABLET | Freq: Every day | ORAL | Status: DC
  Administered 2021-10-07 – 2021-10-08 (×2): 400 mg via ORAL
  Filled 2021-10-07 (×2): qty 1

## 2021-10-07 MED ORDER — NOREPINEPHRINE 4 MG/250ML-% IV SOLN
0.0000 ug/min | INTRAVENOUS | Status: DC
  Administered 2021-10-07 (×2): 8 ug/min via INTRAVENOUS
  Administered 2021-10-08: 02:00:00 7 ug/min via INTRAVENOUS
  Administered 2021-10-08: 13:00:00 5 ug/min via INTRAVENOUS
  Administered 2021-10-09: 3 ug/min via INTRAVENOUS
  Administered 2021-10-10: 2 ug/min via INTRAVENOUS
  Filled 2021-10-07 (×5): qty 250

## 2021-10-07 MED ORDER — TRAZODONE HCL 50 MG PO TABS: 200.0000 mg | ORAL_TABLET | Freq: Every evening | ORAL | Status: DC | PRN

## 2021-10-07 MED ORDER — ESCITALOPRAM OXALATE 10 MG PO TABS
20.0000 mg | ORAL_TABLET | Freq: Every day | ORAL | Status: DC
  Administered 2021-10-07 – 2021-10-08 (×2): 20 mg via ORAL
  Filled 2021-10-07 (×3): qty 2

## 2021-10-07 MED ORDER — SODIUM CHLORIDE 0.9% IV SOLUTION: Freq: Once | INTRAVENOUS | Status: AC

## 2021-10-07 MED ORDER — IPRATROPIUM-ALBUTEROL 0.5-2.5 (3) MG/3ML IN SOLN
3.0000 mL | Freq: Four times a day (QID) | RESPIRATORY_TRACT | Status: DC
  Administered 2021-10-07 – 2021-10-09 (×12): 3 mL via RESPIRATORY_TRACT
  Filled 2021-10-07 (×12): qty 3

## 2021-10-07 MED ORDER — PROSOURCE PLUS PO LIQD
30.0000 mL | Freq: Four times a day (QID) | ORAL | Status: DC
  Administered 2021-10-07 – 2021-10-08 (×4): 30 mL via ORAL
  Filled 2021-10-07 (×4): qty 30

## 2021-10-07 MED ORDER — THIAMINE HCL 100 MG PO TABS
250.0000 mg | ORAL_TABLET | Freq: Every day | ORAL | Status: DC
  Administered 2021-10-07 – 2021-10-08 (×2): 250 mg via ORAL
  Filled 2021-10-07 (×3): qty 3

## 2021-10-07 MED ORDER — FAMOTIDINE IN NACL 20-0.9 MG/50ML-% IV SOLN
20.0000 mg | Freq: Two times a day (BID) | INTRAVENOUS | Status: DC
  Administered 2021-10-07 – 2021-10-20 (×29): 20 mg via INTRAVENOUS
  Filled 2021-10-07 (×32): qty 50

## 2021-10-07 MED ORDER — FOOD THICKENER (SIMPLYTHICK)
1.0000 | ORAL | Status: DC | PRN
  Filled 2021-10-07 (×3): qty 1

## 2021-10-07 MED ORDER — BUDESONIDE 0.25 MG/2ML IN SUSP
0.2500 mg | Freq: Two times a day (BID) | RESPIRATORY_TRACT | Status: DC
Start: 2021-10-07 — End: 2021-10-12
  Administered 2021-10-07 – 2021-10-12 (×10): 0.25 mg via RESPIRATORY_TRACT
  Filled 2021-10-07 (×11): qty 2

## 2021-10-07 MED ORDER — TRAZODONE HCL 50 MG PO TABS
200.0000 mg | ORAL_TABLET | Freq: Every day | ORAL | Status: DC
  Administered 2021-10-07: 22:00:00 200 mg via ORAL
  Filled 2021-10-07: qty 4

## 2021-10-07 MED ORDER — PANTOPRAZOLE SODIUM 40 MG PO TBEC
40.0000 mg | DELAYED_RELEASE_TABLET | Freq: Every day | ORAL | Status: DC
  Administered 2021-10-07 – 2021-10-08 (×2): 40 mg via ORAL
  Filled 2021-10-07 (×3): qty 1

## 2021-10-07 MED ORDER — SODIUM CHLORIDE 0.9 % IV SOLN
250.0000 mL | INTRAVENOUS | Status: DC
  Administered 2021-10-07 – 2021-10-28 (×5): 250 mL via INTRAVENOUS

## 2021-10-07 MED ORDER — ADULT MULTIVITAMIN W/MINERALS CH
1.0000 | ORAL_TABLET | Freq: Every day | ORAL | Status: DC
  Administered 2021-10-07 – 2021-10-08 (×2): 1 via ORAL
  Filled 2021-10-07 (×3): qty 1

## 2021-10-07 MED ORDER — FOLIC ACID 1 MG PO TABS
1.0000 mg | ORAL_TABLET | Freq: Every day | ORAL | Status: DC
  Administered 2021-10-07 – 2021-10-08 (×2): 1 mg via ORAL
  Filled 2021-10-07 (×3): qty 1

## 2021-10-07 MED ORDER — MUPIROCIN 2 % EX OINT
TOPICAL_OINTMENT | Freq: Two times a day (BID) | CUTANEOUS | Status: DC
  Administered 2021-10-07 – 2021-11-02 (×10): 1 via NASAL
  Filled 2021-10-07 (×4): qty 22

## 2021-10-07 MED ORDER — METRONIDAZOLE 500 MG/100ML IV SOLN
500.0000 mg | Freq: Two times a day (BID) | INTRAVENOUS | Status: DC
  Administered 2021-10-07 – 2021-10-08 (×3): 500 mg via INTRAVENOUS
  Filled 2021-10-07 (×3): qty 100

## 2021-10-07 MED ORDER — VALACYCLOVIR HCL 500 MG PO TABS
1000.0000 mg | ORAL_TABLET | Freq: Every day | ORAL | Status: DC
  Administered 2021-10-07 – 2021-10-08 (×2): 1000 mg via ORAL
  Filled 2021-10-07 (×3): qty 2

## 2021-10-07 MED ORDER — ALBUTEROL SULFATE (2.5 MG/3ML) 0.083% IN NEBU: 2.5000 mg | INHALATION_SOLUTION | RESPIRATORY_TRACT | Status: DC | PRN

## 2021-10-07 MED ORDER — HALOPERIDOL 1 MG PO TABS
5.0000 mg | ORAL_TABLET | Freq: Two times a day (BID) | ORAL | Status: DC
  Filled 2021-10-07: qty 5

## 2021-10-07 MED ORDER — LURASIDONE HCL 40 MG PO TABS
120.0000 mg | ORAL_TABLET | Freq: Every day | ORAL | Status: DC
  Administered 2021-10-07 – 2021-10-08 (×2): 120 mg via ORAL
  Filled 2021-10-07 (×3): qty 3

## 2021-10-07 MED ORDER — VANCOMYCIN HCL 1750 MG/350ML IV SOLN
1750.0000 mg | Freq: Two times a day (BID) | INTRAVENOUS | Status: DC
  Administered 2021-10-07 – 2021-10-09 (×5): 1750 mg via INTRAVENOUS
  Filled 2021-10-07 (×5): qty 350

## 2021-10-07 MED ORDER — NOREPINEPHRINE 4 MG/250ML-% IV SOLN: 5.0000 ug/min | INTRAVENOUS | Status: DC

## 2021-10-07 MED ORDER — VASOPRESSIN 20 UNITS/100 ML INFUSION FOR SHOCK
0.0000 [IU]/min | INTRAVENOUS | Status: DC
  Administered 2021-10-07 – 2021-10-08 (×3): 0.03 [IU]/min via INTRAVENOUS
  Filled 2021-10-07 (×4): qty 100

## 2021-10-07 MED ORDER — MIDODRINE HCL 5 MG PO TABS
10.0000 mg | ORAL_TABLET | Freq: Three times a day (TID) | ORAL | Status: DC
  Administered 2021-10-07 – 2021-10-08 (×4): 10 mg via ORAL
  Filled 2021-10-07 (×5): qty 2

## 2021-10-07 MED ORDER — TOPIRAMATE 100 MG PO TABS
100.0000 mg | ORAL_TABLET | Freq: Every day | ORAL | Status: DC
  Administered 2021-10-07: 22:00:00 100 mg via ORAL
  Filled 2021-10-07: qty 1

## 2021-10-07 MED ORDER — CHLORHEXIDINE GLUCONATE CLOTH 2 % EX PADS
6.0000 | MEDICATED_PAD | Freq: Every day | CUTANEOUS | Status: DC
  Administered 2021-10-07 – 2021-10-19 (×13): 6 via TOPICAL

## 2021-10-07 MED ORDER — SODIUM CHLORIDE 0.9 % IV SOLN
250.0000 mL | INTRAVENOUS | Status: DC
  Administered 2021-10-07 – 2021-10-13 (×2): 250 mL via INTRAVENOUS

## 2021-10-07 MED ORDER — NOREPINEPHRINE 4 MG/250ML-% IV SOLN
2.0000 ug/min | INTRAVENOUS | Status: DC
  Administered 2021-10-07: 08:00:00 8 ug/min via INTRAVENOUS
  Administered 2021-10-07: 03:00:00 2 ug/min via INTRAVENOUS
  Filled 2021-10-07: qty 250

## 2021-10-07 MED ORDER — NICOTINE 14 MG/24HR TD PT24
14.0000 mg | MEDICATED_PATCH | Freq: Every day | TRANSDERMAL | Status: DC
  Administered 2021-10-07 – 2021-11-04 (×29): 14 mg via TRANSDERMAL
  Filled 2021-10-07 (×29): qty 1

## 2021-10-07 MED ORDER — HYDROCODONE-ACETAMINOPHEN 5-325 MG PO TABS: 1.0000 | ORAL_TABLET | Freq: Two times a day (BID) | ORAL | Status: DC | PRN

## 2021-10-07 MED ORDER — ALPRAZOLAM 1 MG PO TABS
2.0000 mg | ORAL_TABLET | Freq: Three times a day (TID) | ORAL | Status: DC
  Administered 2021-10-07 – 2021-10-08 (×4): 2 mg via ORAL
  Filled 2021-10-07 (×4): qty 4

## 2021-10-07 MED ORDER — ACETAMINOPHEN 325 MG PO TABS: 650.0000 mg | ORAL_TABLET | ORAL | Status: DC | PRN

## 2021-10-07 MED ORDER — MAGNESIUM SULFATE 2 GM/50ML IV SOLN
2.0000 g | Freq: Once | INTRAVENOUS | Status: AC
  Administered 2021-10-07: 13:00:00 2 g via INTRAVENOUS
  Filled 2021-10-07: qty 50

## 2021-10-07 MED ORDER — LACTATED RINGERS IV SOLN: INTRAVENOUS | Status: DC

## 2021-10-07 MED ORDER — DOCUSATE SODIUM 100 MG PO CAPS: 100.0000 mg | ORAL_CAPSULE | Freq: Two times a day (BID) | ORAL | Status: DC | PRN

## 2021-10-07 MED ORDER — BICTEGRAVIR-EMTRICITAB-TENOFOV 50-200-25 MG PO TABS
1.0000 | ORAL_TABLET | Freq: Every day | ORAL | Status: DC
  Administered 2021-10-07 – 2021-11-04 (×26): 1 via ORAL
  Filled 2021-10-07 (×30): qty 1

## 2021-10-07 MED ORDER — SODIUM CHLORIDE 0.9 % IV SOLN
2.0000 g | Freq: Once | INTRAVENOUS | Status: AC
  Administered 2021-10-07: 12:00:00 2 g via INTRAVENOUS
  Filled 2021-10-07: qty 2

## 2021-10-07 MED ORDER — POLYETHYLENE GLYCOL 3350 17 G PO PACK: 17.0000 g | PACK | Freq: Every day | ORAL | Status: DC | PRN

## 2021-10-07 NOTE — Progress Notes (Signed)
Triple lumen PICC placed in ED

## 2021-10-07 NOTE — Progress Notes (Signed)
CVC placed, not PICC line.

## 2021-10-07 NOTE — Progress Notes (Signed)
Initial Nutrition Assessment  DOCUMENTATION CODES:   Not applicable  INTERVENTION:  - continue order 30 ml Prosource Plus QID, each supplement provides 100 kcal and 15 grams protein.  - complete NFPE at follow-up.    NUTRITION DIAGNOSIS:   Increased nutrient needs related to acute illness, wound healing as evidenced by estimated needs.  GOAL:   Patient will meet greater than or equal to 90% of their needs  MONITOR:   PO intake, Supplement acceptance, Labs, Weight trends  REASON FOR ASSESSMENT:   Malnutrition Screening Tool  ASSESSMENT:   35 y.o. male with medical history of HIV, HTN, PTSD, bipolar 1 disorder, depression, seizures, migraine headaches, and peripheral neuropathy. He presented to the ED via EMS due to soreness to buttocks.  Patient discussed in rounds this AM and with RN at bedside. Patient sleeping at the time of RD visit with no visitors at bedside at that time. RN shared that patient had been sleeping the majority of the shift.   He is currently NPO pending RUQ ultrasound. RN shares that patient took Prosource prior to being made NPO and that he enjoys supplement.  Patient is well-known to this RD from hospitalization in 08/2021 (assessments done on 11/15, 11/17, and 11/21). NFPE was done on 08/24/21 and showed mild depletions to buccal region and clavicle bone region; all other areas WDL.   Weight yesterday was documented as 240 lb which appears to be a stated weight and is stable since 05/16/21. Mild pitting edema to BLE documented in the edema section of flow sheet.    Labs reviewed; K: 3.1 mmol/l, BUN: <5 mg/dl, Ca: 7 mg/dl, Alk Phos elevated.  Medications reviewed; 20 mg IV pepcid BID, 1 mg folvite/day, 400 mg mag-ox/day, 2 g IV Mg sulfate x1 run 12/29, 1 tablet multivitamin with minerals/day, 40 mg oral protonix/day, 40 mEq Klor-Con x1 dose 12/28, 250 mg thiamine/day.  IVF; LR @ 100 ml/hr.      NUTRITION - FOCUSED PHYSICAL EXAM:  Patient sleeping  throughout the shift  Diet Order:   Diet Order             Diet NPO time specified Except for: Sips with Meds  Diet effective ____                   EDUCATION NEEDS:   Not appropriate for education at this time  Skin:  Skin Assessment: Reviewed RN Assessment ([facility] note from 12/29)  Last BM:  PTA/unknown  Height:   Ht Readings from Last 1 Encounters:  10/06/21 6' 3"  (1.905 m)    Weight:   Wt Readings from Last 1 Encounters:  10/06/21 108.9 kg     Estimated Nutritional Needs:  Kcal:  2180-2400 kcal Protein:  110-125 grams Fluid:  >/= 2.3 L/day      Jarome Matin, MS, RD, LDN, CNSC Inpatient Clinical Dietitian RD pager # available in [place]  After hours/weekend pager # available in [facility]
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
## 2021-10-07 NOTE — Consult Note (Signed)
[facility]    Date of Admission:  10/06/2021     Reason for Consult: Septic shock     Referring Physician: Dr Chase Caller  Current antibiotics: Vanco 12/28-present Cefepime 12/29-present Metronidazole 12/29-present Biktarvy 12/29-present Valtrex 12/29-present  Previous antibiotics: Zosyn 12/28-12/28    ASSESSMENT:    35 y.o. male admitted with:  Concern for septic shock:  He had similar admission in November 2022 with hypothermia, hypotension, severe malnutrition, and bicytopenia.  Concern was raised for possible alcoholism/exposure and nutritional related vs adrenal insufficiency.  He is on broad spectrum antibiotics right now due to concern for septic shock and infected gluteal wounds vs urinary source with gas noted on CT scan. Chronic wounds to bilateral buttocks and posterior thighs:  Possibility of hidradenitis had been raised previously and patient discharged home on doxycycline.  Unclear if patient was taking this leading up to admission, but there is concern from [facility] that this area looks infected with purulent drainage. Gas seen in urinary system on CT at admit: Unclear signficance as he has no urinary complaints currently.  UA not particularly revealing and cultures pending. HIV: well controlled on Biktarvy and followed at [facility]. Alcohol use disorder and concern for advanced liver disease Bicytopenia:  Worsening anemia noted and no schistocytes noted on smear. Severe protein calorie malnutrition  RECOMMENDATIONS:    Unclear if this presentation is related to sepsis or not.  Would continue broad-spectrum antibiotics for now.  If cultures are negative around 48 to 72 hours, would consider stopping antibiotics +/- resuming doxycycline for possible hidradenitis.  Discussed with RN, and wounds do not currently look terribly infected at this time. Consider urology consult regarding CT findings of gas in urinary system.  Continue biktarvy. Follow  up cultures. Supportive care per CCM. Appreciate WOC eval.   Principal Problem:   Septic shock (HCC) Active Problems:   Hypokalemia   Elevated LFTs   Hyponatremia   Macrocytic anemia   Thrombocytopenia (HCC)   Prolonged QT interval   Hypothermia   HIV positive (HCC)   Avascular necrosis of femoral head, left (HCC)   Hepatic steatosis   Cellulitis of multiple sites of buttock   MEDICATIONS:    Scheduled Meds:  (feeding supplement) PROSource Plus  30 mL Oral QID   alprazolam  2 mg Oral TID   bictegravir-emtricitabine-tenofovir AF  1 tablet Oral Daily   budesonide (PULMICORT) nebulizer solution  0.25 mg Nebulization BID   Chlorhexidine Gluconate Cloth  6 each Topical Q0600   escitalopram  20 mg Oral Daily   folic acid  1 mg Oral Daily   ipratropium-albuterol  3 mL Nebulization Q6H   lurasidone  120 mg Oral Daily   magnesium oxide  400 mg Oral Daily   midodrine  10 mg Oral TID WC   multivitamin with minerals  1 tablet Oral Daily   mupirocin ointment   Nasal BID   nicotine  14 mg Transdermal Daily   pantoprazole  40 mg Oral Daily   thiamine  250 mg Oral Daily   topiramate  100 mg Oral QHS   traZODone  200 mg Oral QHS   valACYclovir  1,000 mg Oral Daily   Continuous Infusions:  sodium chloride 250 mL (10/07/21 1300)   sodium chloride 120 mL/hr at 10/07/21 0500   ceFEPime (MAXIPIME) IV 2 g (10/07/21 1707)   famotidine (PEPCID) IV Stopped (10/07/21 1152)   lactated ringers Stopped (10/07/21 1425)   metronidazole Stopped (10/07/21 1537)   norepinephrine (  LEVOPHED) Adult infusion 7 mcg/min (10/07/21 1640)   vancomycin Stopped (10/07/21 1626)   vasopressin 0.03 Units/min (10/07/21 1640)   PRN Meds:.acetaminophen, albuterol, docusate sodium, food thickener, HYDROcodone-acetaminophen, polyethylene glycol, traZODone  HPI:    [name] is a 35 y.o. male with a past medical history of HIV on Biktarvy (followed at [facility]), left hip avascular necrosis, bipolar,  schizoaffective disorder, and chronic/recurrent buttocks wounds who was admitted to the hospital overnight and early this morning after he presented at the advisement of his home health service.  They were concerned that his sores may have become infected and encouraged him to present for this purpose.  He reports greater than normal fatigue.  He was also found to be hypothermic on arrival.  Labs were notable for hypokalemia and lactic acidosis.  LFTs were elevated with an AST of 62, ALT 71, alk phos 314.  His albumin is less than 1.5.  He was hypotensive and required norepinephrine.  He is currently on 8 mcg as well as 0.3 of vasopressin.  He was started on vancomycin and Zosyn.  This has been narrowed to cefepime, metronidazole, and vancomycin.  He was evaluated by [facility] who noted scattered open wounds measuring 0.5 to 1 cm with a punched-out appearance.  Purulence was noted.  Patient also underwent a CT of the abdomen/pelvis.  This showed abnormal gas within the bilateral renal collecting systems, urinary bladder, and dependent bladder wall.  There was no associated inflammatory changes that would be associated with UTI or emphysematous cystitis.  Other labs revealed leukocytosis with a mild elevation in white count up to 11.5.  His platelets were significantly decreased to 17 in the setting of chronic thrombocytopenia and hemoglobin is 5.8.  Blood cultures have been obtained and are no growth x2.  A urinalysis was obtained which showed negative nitrites, 21-50 white blood cells per high-powered field, and rare bacteria.  Urine culture is pending.  He has no urinary complaints.  He has had no recent urinary procedures or instrumentation.  He has been afebrile this admission but was hypothermic upon arrival.   HIV is well controlled with CD4 count 1169 in November 2022 and VL <20 in September 2022.   Past Medical History:  Diagnosis Date   Bipolar 1 disorder ([place])    Depression    Dizziness and giddiness  02/01/2016   Herpes genitalia    HIV disease ([place])    Hypertension    Migraine headache 02/01/2016   Peripheral neuropathy 10/01/2019   PTSD (post-traumatic stress disorder)    Schizoaffective disorder (HCC)    Seizures ([place])     Social History   Tobacco Use   Smoking status: Every Day    Packs/day: 1.00    Types: Cigarettes   Smokeless tobacco: Never  Vaping Use   Vaping Use: Never used  Substance Use Topics   Alcohol use: Yes    Alcohol/week: 14.0 standard drinks    Types: 14 Cans of beer per week    Comment: 5-6 40oz per day   Drug use: No    Family History  Problem Relation Age of Onset   Alcohol abuse Mother    Schizophrenia Father    Depression Father    Alcohol abuse Father    Alcohol abuse Paternal Uncle    Alcohol abuse Paternal Uncle     Allergies  Allergen Reactions   Dapsone Other (See Comments)    Per centricity "G6PD deficient"   Primaquine Phosphate Other (See Comments)  Per Centricity "G6PD deficient"    Review of Systems  Unable to perform ROS: Mental acuity   OBJECTIVE:   Blood pressure 99/61, pulse 75, temperature 98.2 F (36.8 C), temperature source Axillary, resp. rate 12, height _0  (1.905 m), weight 108.9 kg, SpO2 100 %. Body mass index is 30 kg/m.  Physical Exam Constitutional:      Comments: Somnolent, opens his eyes to voice and lifts his head off the pillow but then quickly closes them again without answering questions.  HENT:     Head: Normocephalic and atraumatic.  Eyes:     Extraocular Movements: Extraocular movements intact.     Conjunctiva/sclera: Conjunctivae normal.  Cardiovascular:     Rate and Rhythm: Normal rate and regular rhythm.  Pulmonary:     Effort: Pulmonary effort is normal. No respiratory distress.     Comments: Breath sounds are diminished at the bases and coarse bilaterally Abdominal:     General: There is no distension.     Palpations: Abdomen is soft.     Tenderness: There is no abdominal  tenderness.  Musculoskeletal:        General: Swelling present.     Right lower leg: Edema present.     Left lower leg: Edema present.  Skin:    General: Skin is warm and dry.     Findings: Lesion present.     Comments: Woody, brawny lower extremity edema. Multiple wounds noted.  Neurological:     General: No focal deficit present.      Lab Results: Lab Results  Component Value Date   WBC 11.4 (H) 10/07/2021   HGB 5.8 (LL) 10/07/2021   HCT 17.7 (L) 10/07/2021   MCV 98.3 10/07/2021   PLT 60 (L) 10/07/2021    Lab Results  Component Value Date   NA 136 10/07/2021   K 3.1 (L) 10/07/2021   CO2 25 10/07/2021   GLUCOSE 88 10/07/2021   BUN <5 (L) 10/07/2021   CREATININE 0.78 10/07/2021   CALCIUM 7.0 (L) 10/07/2021   GFRNONAA >60 10/07/2021   GFRAA 87 11/25/2020    Lab Results  Component Value Date   ALT 53 (H) 10/07/2021   AST 48 (H) 10/07/2021   ALKPHOS 251 (H) 10/07/2021   BILITOT 1.4 (H) 10/07/2021       Component Value Date/Time   CRP 0.8 11/01/2019 0028   CRP 24 (H) 10/01/2019 1700       Component Value Date/Time   ESRSEDRATE 65 (H) 10/01/2019 1700    I have reviewed the micro and lab results in Epic.  Imaging: CT Abdomen Pelvis W Contrast  Result Date: 10/06/2021 CLINICAL DATA:  Wound on buttocks, sepsis EXAM: CT ABDOMEN AND PELVIS WITH CONTRAST TECHNIQUE: Multidetector CT imaging of the abdomen and pelvis was performed using the standard protocol following bolus administration of intravenous contrast. CONTRAST:  67m OMNIPAQUE IOHEXOL 350 MG/ML SOLN COMPARISON:  09/10/2020 FINDINGS: Lower chest: No acute pleural or parenchymal lung disease. Hepatobiliary: Diffuse hepatic steatosis. No focal liver abnormality. The gallbladder is moderately distended without evidence of cholelithiasis or cholecystitis. Pancreas: Unremarkable. No pancreatic ductal dilatation or surrounding inflammatory changes. Spleen: Normal in size without focal abnormality.  Adrenals/Urinary Tract: There is gas within the bladder lumen, as well as a small amount of intramural gas seen dependently within the bladder. Findings could reflect emphysematous cystitis or recent instrumentation of the bladder. No bladder wall thickening or perivesicular fat stranding. There is a small amount of gas within the bilateral renal collecting  systems, right greater than left, likely related to the gas in the bladder described above. The kidneys enhance normally and symmetrically, with no other findings to suggest infection or pyelonephritis. No urinary tract calculi or obstructive uropathy. The adrenals are unremarkable. Stomach/Bowel: No bowel obstruction or ileus. Normal appendix right lower quadrant. No bowel wall thickening or inflammatory change. Vascular/Lymphatic: Borderline enlarged lymph nodes are seen within the bilateral inguinal regions and pelvis. Largest lymph node in the right external iliac chain measures up to 18 mm in short axis. Smaller subcentimeter lymph nodes are seen within the retroperitoneum. These may be reactive. No significant vascular findings. Reproductive: Prostate is unremarkable. Other: No free fluid or free intraperitoneal gas. Fat containing umbilical hernia. No bowel herniation. Musculoskeletal: Subcutaneous edema is seen dependently within the suprapubic region, bilateral flanks and buttocks. Small foci of subcutaneous gas within the right buttock could be related to infection or recent injection. No fluid collection or abscess. There are no acute or destructive bony lesions. 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 IMPRESSION: 1. Abnormal gas within the bilateral renal collecting systems, urinary bladder, and dependent bladder wall. Given the lack of associated inflammatory changes within the kidneys or bladder, I would favor recent instrumentation over acute urinary tract infection and  emphysematous cystitis. Please correlate with procedural history. 2. Subcutaneous edema within the suprapubic region, bilateral flanks, and buttocks, with small foci of subcutaneous gas in the right buttock as above. Findings could reflect cellulitis. No fluid collection or abscess. 3. Chronic retroperitoneal and pelvic adenopathy. 4. 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 5. Hepatic steatosis. 6. Fat containing umbilical hernia. Electronically Signed   By: Randa Ngo M.D.   On: 10/06/2021 20:41   DG CHEST PORT 1 VIEW  Result Date: 10/07/2021 CLINICAL DATA:  Central line placement EXAM: PORTABLE CHEST 1 VIEW COMPARISON:  10/07/2021 FINDINGS: CVC with tip overlying the right atrium. Unchanged cardiac and mediastinal silhouette. No focal pulmonary opacity. Elevation of the right hemidiaphragm. No pleural effusion or pneumothorax. No acute osseous abnormality. IMPRESSION: No acute cardiopulmonary process. Electronically Signed   By: Merilyn Baba M.D.   On: 10/07/2021 11:03   DG Chest Port 1 View  Result Date: 10/07/2021 CLINICAL DATA:  Sore on buttocks, fatigue, central line placement EXAM: PORTABLE CHEST 1 VIEW COMPARISON:  10/06/2021 FINDINGS: Single frontal view of the chest demonstrates right internal jugular dialysis catheter tip overlying the atriocaval junction. Cardiac silhouette is stable. No airspace disease, effusion, or pneumothorax. No acute bony abnormalities. IMPRESSION: 1. No complication after right internal jugular catheter placement. 2. No acute process. Electronically Signed   By: Randa Ngo M.D.   On: 10/07/2021 01:05   DG Chest Port 1 View  Result Date: 10/06/2021 CLINICAL DATA:  Possible sepsis EXAM: PORTABLE CHEST 1 VIEW COMPARISON:  08/23/2021 FINDINGS: The heart size and mediastinal contours are within normal limits. Both lungs are clear. The visualized skeletal structures are unremarkable.  IMPRESSION: No active disease. Electronically Signed   By: Elmer Picker M.D.   On: 10/06/2021 18:53   VAS [place] LOWER EXTREMITY VENOUS (DVT)  Result Date: 10/07/2021  Lower Venous DVT Study Patient Name:  BLAKELY GLUTH  Date of Exam:   10/07/2021 Medical Rec #: [id number]         Accession #:    [id number] Date of Birth: 1986/07/30         Patient Gender: M Patient Age:  35 years Exam Location:  [facility] Procedure:      VAS [place] LOWER EXTREMITY VENOUS (DVT) Referring Phys: MURALI RAMASWAMY --------------------------------------------------------------------------------  Indications: Edema.  Limitations: Body habitus, poor ultrasound/tissue interface and patient position, restricted mobility, patient unable to cooperate. Comparison Study: 05/08/2021 negative lower extremity venous duplex Performing Technologist: Maudry Mayhew MHA, RDMS, RVT, RDCS  Examination Guidelines: A complete evaluation includes B-mode imaging, spectral Doppler, color Doppler, and power Doppler as needed of all accessible portions of each vessel. Bilateral testing is considered an integral part of a complete examination. Limited examinations for reoccurring indications may be performed as noted. The reflux portion of the exam is performed with the patient in reverse Trendelenburg.  +---------+---------------+---------+-----------+----------+--------------+  RIGHT     Compressibility Phasicity Spontaneity Properties Thrombus Aging  +---------+---------------+---------+-----------+----------+--------------+  CFV       Full            Yes       Yes                                    +---------+---------------+---------+-----------+----------+--------------+  SFJ       Full                                                             +---------+---------------+---------+-----------+----------+--------------+  FV Prox   Full                                                              +---------+---------------+---------+-----------+----------+--------------+  FV Mid    Full                                                             +---------+---------------+---------+-----------+----------+--------------+  FV Distal Full                                                             +---------+---------------+---------+-----------+----------+--------------+  POP       Full            Yes       Yes                                    +---------+---------------+---------+-----------+----------+--------------+  PTV       Full                                                             +---------+---------------+---------+-----------+----------+--------------+  PERO      Full                                                             +---------+---------------+---------+-----------+----------+--------------+   Right Technical Findings: Not visualized segments include PFV, limited evaluation PTV and peroneal veins.  +---------+---------------+---------+-----------+----------+--------------+  LEFT      Compressibility Phasicity Spontaneity Properties Thrombus Aging  +---------+---------------+---------+-----------+----------+--------------+  FV Mid    Full                                                             +---------+---------------+---------+-----------+----------+--------------+  FV Distal Full                                                             +---------+---------------+---------+-----------+----------+--------------+  POP       Full            Yes       Yes                                    +---------+---------------+---------+-----------+----------+--------------+   Left Technical Findings: Not visualized segments include CFV, SFJ, FV prox, PFV, PTV, peroneal veins.   Summary: RIGHT: - There is no evidence of deep vein thrombosis in the lower extremity. However, portions of this examination were limited- see technologist comments above.  - No cystic structure found in the  popliteal fossa.  LEFT: - There is no evidence of deep vein thrombosis involving the visualized veins of the left lower extremity. However, this examination were limited- see technologist comments above.  - No cystic structure found in the popliteal fossa.  *See table(s) above for measurements and observations.    Preliminary      Imaging independently reviewed in Epic.  [facility] [phone] pager 10/07/2021, 5:10 PM

## 2021-10-07 NOTE — Progress Notes (Signed)
Bilateral lower extremity venous duplex completed. Refer to "CV Proc" under chart review to view preliminary results.  10/07/2021 3:48 PM Kelby Aline., MHA, RVT, RDCS, RDMS

## 2021-10-07 NOTE — Progress Notes (Signed)
Pharmacy Antibiotic Note  [name] is a 35 y.o. male admitted on 10/06/2021 with sepsis 2nd extensive cellulitis and wounds to bilateral buttocks and posterior thighs.  Pharmacy has been consulted for vancomycin/cefepime/flagyl dosing.  Plan: Vancomycin 2 gm given yesterday in ED at 2100 Vancomycin 1750 mg IV q12 for est AUC 495 using SCr 0.8, VD 0.5 Cefepime 2 gm IV q8hr Flagyl 500 mg IV q12 F/u renal function, WBC, temp, cultures Vancomycin levels as needed  Height: 6' 3"  (190.5 cm) Weight: 108.9 kg (240 lb) IBW/kg (Calculated) : 84.5  Temp (24hrs), Avg:96.2 F (35.7 C), Min:92.5 F (33.6 C), Max:98.5 F (36.9 C)  Recent Labs  Lab 10/06/21 1858 10/06/21 2150 10/07/21 0249 10/07/21 0529  WBC 11.5*  --  11.4*  --   CREATININE 0.88  --  0.78  --   LATICACIDVEN 6.4* 4.3* 2.4* 2.9*    Estimated Creatinine Clearance: 168.6 mL/min (by C-G formula based on SCr of 0.78 mg/dL).    Allergies  Allergen Reactions   Dapsone Other (See Comments)    Per centricity "G6PD deficient"   Primaquine Phosphate Other (See Comments)    Per Centricity "G6PD deficient"   Antimicrobials this admission:  12/28 zosyn x1 12/28 vanc >> 12/29 cefepime>> 12/29 flagyl >> Holding PTA Biktarvy for now Dose adjustments this admission:    Microbiology results:  12/29 MRSA positive 12/29 UCx: sent 12/28 BCx2: ngtd  Thank you for allowing pharmacy to be a part of this patients care.  Eudelia Bunch, Pharm.D 10/07/2021 10:03 AM

## 2021-10-07 NOTE — ED Notes (Signed)
Temp Foley attempted. Procedure explained to patient but upon time to insert catheter patient became upset and adamantly refused to have foley placed. Patient encouraged by multiple staff members, but patient continues to refused and states he does not want a catheter.

## 2021-10-07 NOTE — Consult Note (Signed)
WOC Nurse Consult Note: Reason for Consult:HIV positive, history extensive cellulitis and wounds to bilateral buttocks and posterior thighs.  Bloody drainage, purulence noted.  On vancomycin and Zosyn at this time.  On Biktarvy and has not missed doses.  History genital herpes.  Wound type:infectious, unclear etiology, chronic, nonhealing.  Pressure Injury POA: NA Measurement: Scattered open wounds, 0.5 cm to 1 cm round with punched out appearance.  Purulence noted.  Scarring from previous ulcerations noted.  Wound bed:red and friable Drainage (amount, consistency, odor) moderate serosanguinous with some purulence present.  Periwound:warmth, erythema and tenderness Dressing procedure/placement/frequency:Cleanse wounds to buttocks, perineum and posterior thighs with NS and pat dry. Appy Xeroform gauze to open areas.  Cover with ABD pads and secure with mesh underpants. Change daily.  Will not follow at this time.  Please re-consult if needed.  Domenic Moras MSN, RN, FNP-BC CWON Wound, Ostomy, Continence Nurse Pager [phone]

## 2021-10-07 NOTE — Progress Notes (Signed)
eLink Physician-Brief Progress Note Patient Name: [name] DOB: 12/04/1985 MRN: [id number]   Date of Service  10/07/2021  HPI/Events of Note  HIV positive male admitted with septic shock presumed to be related to infected buttock wounds.  eICU Interventions  New Patient Evaluation. Work up in progress.        [name] [name] 10/07/2021, 5:08 AM

## 2021-10-07 NOTE — ED Notes (Signed)
Pt refuse temp foley

## 2021-10-07 NOTE — H&P (Addendum)
NAME:  [name] MRN:  [id number] DOB:  12-26-1985 LOS: 0 ADMISSION DATE:  10/06/2021 DATE OF SERVICE:  10/07/2021  CHIEF COMPLAINT: Sore on buttocks  HISTORY & PHYSICAL  History of Present Illness  This 35 y.o. African-American male, HIV positive smoker presented to the [facility] Emergency Department via EMS with complaints of sore on his buttocks.  He apparently had a visit from the home health service earlier today, who raised concerns that the sores may have become infected and advised him to present to the emergency department.  The patient reported that he does endorse fatigue, more than normal.  He was noted to be hypothermic (92.5 F) on arrival.  He was started on empiric Zosyn/vancomycin.  Initial labs revealed hypokalemia (2.7) and elevated lactate (6.4).  He was also noted to have abnormal LFTs with a cholestatic pattern.  Bear hugger was placed.  IV fluid resuscitation was started.  BP was noted to trend downward.  Norepinephrine infusion started.  REVIEW OF SYSTEMS  Constitutional: No weight loss. No night sweats. No fever. No chills. No fatigue. HEENT: Dry mouth.  No headaches, dysphagia, sore throat, otalgia, nasal congestion, PND CV:  No chest pain, orthopnea, PND, swelling in lower extremities, palpitations GI:  Abdominal pain, nausea, no vomiting.  Anorexia.  No diarrhea, change in bowel pattern. Resp: No DOE, rest dyspnea, cough, mucus, hemoptysis, wheezing  GU: no dysuria, change in color of urine, no urgency or frequency.  No flank pain. MS:  No joint pain or swelling. No myalgias,  No decreased range of motion.  Psych:  No change in mood or affect. No memory loss. Skin: Sores on buttocks   Past Medical/Surgical/Social/Family History   Past Medical History:  Diagnosis Date   Bipolar 1 disorder ([place])    Depression    Dizziness and giddiness 02/01/2016   Herpes genitalia    HIV disease ([place])    Hypertension    Migraine headache  02/01/2016   Peripheral neuropathy 10/01/2019   PTSD (post-traumatic stress disorder)    Schizoaffective disorder ([place])    Seizures ([place])     Past Surgical History:  Procedure Laterality Date   BACK SURGERY     BIOPSY  02/26/2021   Procedure: BIOPSY;  Surgeon: Otis Brace, MD;  Location: [facility];  Service: Gastroenterology;;   COLONOSCOPY WITH PROPOFOL [name] 02/26/2021   Procedure: COLONOSCOPY WITH PROPOFOL;  Surgeon: Otis Brace, MD;  Location[facility];  Service: Gastroenterology;  Laterality: [name];   ESOPHAGOGASTRODUODENOSCOPY (EGD) WITH PROPOFOL [name] 02/26/2021   Procedure: ESOPHAGOGASTRODUODENOSCOPY (EGD) WITH PROPOFOL;  Surgeon: Otis Brace, MD;  [facility];  Service: Gastroenterology;  Laterality: [name];   ESOPHAGOGASTRODUODENOSCOPY (EGD) WITH PROPOFOL [name] 05/18/2021   Procedure: ESOPHAGOGASTRODUODENOSCOPY (EGD) WITH PROPOFOL;  Surgeon: Otis Brace, MD;  [facility];  Service: Gastroenterology;  Laterality: [name];   HAND SURGERY      Social History   Tobacco Use   Smoking status: Every Day    Packs/day: 1.00    Types: Cigarettes   Smokeless tobacco: Never  Substance Use Topics   Alcohol use: Yes    Alcohol/week: 14.0 standard drinks    Types: 14 Cans of beer per week    Comment: 5-6 40oz per day    Family History  Problem Relation Age of Onset   Alcohol abuse Mother    Schizophrenia Father    Depression Father    Alcohol abuse Father    Alcohol abuse Paternal Uncle  Alcohol abuse Paternal Uncle     Procedures:  CVC (12/29)   Significant Diagnostic Tests:    Micro Data:   Results for orders placed or performed during the hospital encounter of 08/23/21  Blood Culture (routine x 2)     Status: None   Collection Time: 08/23/21  7:40 PM   Specimen: BLOOD  Result Value Ref Range Status   Specimen Description   Final    BLOOD RIGHT ANTECUBITAL Performed at [facility] [address]  [place], [place] [address]    Special Requests   Final    BOTTLES DRAWN AEROBIC AND ANAEROBIC Blood Culture results may not be optimal due to an inadequate volume of blood received in culture bottles Performed at [facility] [address] [place], [place] [address]    Culture   Final    NO GROWTH 5 DAYS Performed at [facility], [place] [address] [place], [place] [address]    Report Status 08/29/2021 FINAL  Final  Resp Panel by RT-PCR (Flu A&B, Covid) Nasopharyngeal Swab     Status: None   Collection Time: 08/23/21  9:00 PM   Specimen: Nasopharyngeal Swab; Nasopharyngeal(NP) swabs in vial transport medium  Result Value Ref Range Status   SARS Coronavirus 2 by RT PCR NEGATIVE NEGATIVE Final    Comment: (NOTE) SARS-CoV-2 target nucleic acids are NOT DETECTED.  The SARS-CoV-2 RNA is generally detectable in upper respiratory specimens during the acute phase of infection. The lowest concentration of SARS-CoV-2 viral copies this assay can detect is 138 copies/mL. A negative result does not preclude SARS-Cov-2 infection and should not be used as the sole basis for treatment or other patient management decisions. A negative result may occur with  improper specimen collection/handling, submission of specimen other than nasopharyngeal swab, presence of viral mutation(s) within the areas targeted by this assay, and inadequate number of viral copies(<138 copies/mL). A negative result must be combined with clinical observations, patient history, and epidemiological information. The expected result is Negative.  Fact Sheet for Patients:  EntrepreneurPulse.com.au  Fact Sheet for Healthcare Providers:  IncredibleEmployment.be  This test is no t yet approved or cleared by the [place] FDA and  has been authorized for detection and/or diagnosis of SARS-CoV-2 by FDA under an Emergency Use Authorization (EUA). This EUA will remain  in  effect (meaning this test can be used) for the duration of the COVID-19 declaration under Section 564(b)(1) of the Act, 21 U.S.C.section 360bbb-3(b)(1), unless the authorization is terminated  or revoked sooner.       Influenza A by PCR NEGATIVE NEGATIVE Final   Influenza B by PCR NEGATIVE NEGATIVE Final    Comment: (NOTE) The Xpert Xpress SARS-CoV-2/FLU/RSV plus assay is intended as an aid in the diagnosis of influenza from Nasopharyngeal swab specimens and should not be used as a sole basis for treatment. Nasal washings and aspirates are unacceptable for Xpert Xpress SARS-CoV-2/FLU/RSV testing.  Fact Sheet for Patients: EntrepreneurPulse.com.au  Fact Sheet for Healthcare Providers: IncredibleEmployment.be  This test is not yet approved or cleared by the [place] FDA and has been authorized for detection and/or diagnosis of SARS-CoV-2 by FDA under an Emergency Use Authorization (EUA). This EUA will remain in effect (meaning this test can be used) for the duration of the COVID-19 declaration under Section 564(b)(1) of the Act, 21 U.S.C. section 360bbb-3(b)(1), unless the authorization is terminated or revoked.  Performed at [facility], [address] [place], [place]  [address]   MRSA Next Gen by PCR, Nasal     Status: Abnormal   Collection Time: 08/24/21  5:01 AM   Specimen: Nasal Mucosa; Nasal Swab  Result Value Ref Range Status   MRSA by PCR Next Gen DETECTED (A) NOT DETECTED Final    Comment: RESULT CALLED TO, READ BACK BY AND VERIFIED WITH: BULL, E. RN ON 08/24/2021 @ 7494 BY MECIAL J.  (NOTE) The GeneXpert MRSA Assay (FDA approved for NASAL specimens only), is one component of a comprehensive MRSA colonization surveillance program. It is not intended to diagnose MRSA infection nor to guide or monitor treatment for MRSA infections. Test performance is not FDA approved in patients less than 55  years old. Performed at [facility], [place] [address] [place], [place] [address]   Culture, blood (Routine X 2) w Reflex to ID Panel     Status: None   Collection Time: 08/24/21  8:11 AM   Specimen: BLOOD LEFT HAND  Result Value Ref Range Status   Specimen Description   Final    BLOOD LEFT HAND Performed at [facility] [address] [place], [place] [address]    Special Requests   Final    BOTTLES DRAWN AEROBIC ONLY Blood Culture adequate volume Performed at [facility] [address] [place], [place] [address]    Culture   Final    NO GROWTH 5 DAYS Performed at [facility], [place] [address], [place], [place] [address]    Report Status 08/29/2021 FINAL  Final     Antimicrobials:  Zosyn/vancomycin (12/29)    Interim history/subjective:     Objective   BP 111/70    Pulse 95    Temp (!) 95.8 F (35.4 C) (Rectal)    Resp (!) 21    Ht 6' 3"  (1.905 m)    Wt 108.9 kg    SpO2 100%    BMI 30.00 kg/m     Filed Weights   10/06/21 1854  Weight: 108.9 kg    Intake/Output Summary (Last 24 hours) at 10/07/2021 0150 Last data filed at 10/07/2021 0051 Gross per 24 hour  Intake 4736.53 ml  Output --  Net 4736.53 ml        Examination: GENERAL: alert, oriented to time, person and place, pleasant, well-developed. No acute distress.  Very soft spoken. HEAD: normocephalic, atraumatic EYE: PERRLA, EOM intact, no scleral icterus, no pallor. NOSE: nares are patent. No polyps. No exudate. No sinus tenderness. THROAT/ORAL CAVITY: Normal dentition. No oral thrush. No exudate. Mucous membranes are moist. No tonsillar enlargement.  Mallampati class IV (only hard palate visible) airway. NECK: R IJ CVC in situ.  supple, no thyromegaly, no JVD, no lymphadenopathy. Trachea midline. CHEST/LUNG: symmetric in development and expansion. Good air entry. No crackles. No wheezes. HEART: Regular S1 and S2 without murmur, rub or  gallop. ABDOMEN: soft, nontender, nondistended. Normoactive bowel sounds. No rebound. No guarding. No hepatosplenomegaly. EXTREMITIES: Edema: 2+. No cyanosis. No clubbing. 2+ DP pulses LYMPHATIC: no cervical/axillary/inguinal lymph nodes appreciated MUSCULOSKELETAL: No point tenderness. No bulk atrophy. Joints: normal inspection.  SKIN:  Open sores on buttocks with bloody oozing. NEUROLOGIC: Doll's eyes intact. Corneal reflex intact. Spontaneous respirations intact. Cranial nerves II-XII are grossly symmetric and physiologic. Babinski absent. No sensory deficit. Motor: 5/5 @ RUE, 5/5 @ LUE, 5/5 @ RLL,  5/5 @ LLL.  DTR: 2+ @ R biceps, 2+ @ L biceps, 2+ @ R patellar,  2+ @ L  patellar. No cerebellar signs. Gait was not assessed.   Resolved Hospital Problem list      Assessment & Plan:   ASSESSMENT/PLAN:  ASSESSMENT (included in the Hospital Problem List)  Principal Problem:   Septic shock [facility]) Active Problems:   Thrombocytopenia (HCC)   Cellulitis of multiple sites of buttock   Hypokalemia   Hyponatremia   Prolonged QT interval   Hypothermia   Elevated LFTs   Macrocytic anemia   HIV positive (HCC)   Avascular necrosis of femoral head, left (HCC)   Hepatic steatosis   By systems: PULMONARY: No acute issues Titrate supplemental oxygen to maintain SpO2 93+% DuoNeb/Pulmicort scheduled   CARDIOVASCULAR Shock, likely septic shock Prolonged QTc, likely related to polypharmacy resulting in QT prolongation Check SvO2. Trend lactate Titrate norepinephrine to maintain MAP 65. Decrease Bair Hugger temperature to 38 C (from 42 C). Continue IV fluids. Check ionized Ca.   RENAL Hyponatremia Hypokalemia Replete K Check Mg Hyponatremia may be an adverse effect of ongoing therapy with SSRI.  Given his significant psych history, will continue for now.  Monitor Na.   GASTROINTESTINAL Abnormal LFTs, cholestatic pattern Perhaps due to retroviral protease inhibitor GI PROPHYLAXIS:  famotidine   HEMATOLOGIC Thrombocytopenia, likely ~ of sepsis Macrocytic anemia With ongoing bleeding from sores, will transfuse platelets. Check DIC panel. DVT PROPHYLAXIS: SCDs for now   INFECTIOUS Septic shock, likely from cellulitis Cellulitis/buttocks HIV-positive Continue empiric Zosyn/vancomycin Follow up blood cultures, urinalysis Check CD4 count.  Hold Biktarvy for now.   ENDOCRINE: No acute issues   NEUROLOGIC History of depression, bipolar I, schizoaffective disorder History of PTSD History of seizures No suicidality at this time.  MUSCULOSKELETAL Avascular necrosis of the LEFT femoral head  PLAN/RECOMMENDATIONS  Admit to ICU under my service (Attending: [name], MD) with the diagnoses highlighted above in the active Hospital Problem List (ASSESSMENT).     My assessment, plan of care, findings, medications, side effects, etc. were discussed with: nurse.   Best practice:  Diet: regular Pain/Anxiety/Delirium protocol (if indicated): [name] VAP protocol (if indicated): [name] DVT prophylaxis: SCDs only (thrombocytopenia, active bleeding) GI prophylaxis: famotidine Glucose control: [name] Mobility/Activity: bedrest   Code Status: Full Code Family Communication:   no family at bedside Disposition: admit to ICU   Labs   CBC: Recent Labs  Lab 10/06/21 1858  WBC 11.5*  NEUTROABS 8.8*  HGB 7.4*  HCT 23.1*  MCV 100.0  PLT 17*    Basic Metabolic Panel: Recent Labs  Lab 10/06/21 1858  NA 132*  K 2.7*  CL 98  CO2 22  GLUCOSE 94  BUN <5*  CREATININE 0.88  CALCIUM 7.9*   GFR: Estimated Creatinine Clearance: 153.3 mL/min (by C-G formula based on SCr of 0.88 mg/dL). Recent Labs  Lab 10/06/21 1858 10/06/21 2150  WBC 11.5*  --   LATICACIDVEN 6.4* 4.3*    Liver Function Tests: Recent Labs  Lab 10/06/21 1858  AST 62*  ALT 71*  ALKPHOS 314*  BILITOT 1.6*  PROT 6.6  ALBUMIN <1.5*   No results for input(s): LIPASE, AMYLASE in the last  168 hours. No results for input(s): AMMONIA in the last 168 hours.  ABG    Component Value Date/Time   PHART 7.339 (L) 10/28/2019 0916   PCO2ART 48.2 (H) 10/28/2019 0916   PO2ART 81.0 (L) 10/28/2019 0916   HCO3 26.0 10/28/2019 0916   TCO2 27 10/28/2019 0916   ACIDBASEDEF 2.0 10/28/2019 0550   O2SAT 95.0 10/28/2019 0916     Coagulation Profile:  Recent Labs  Lab 10/06/21 1858  INR 1.6*    Cardiac Enzymes: No results for input(s): CKTOTAL, CKMB, CKMBINDEX, TROPONINI in the last 168 hours.  HbA1C: Hgb A1c MFr Bld  Date/Time Value Ref Range Status  08/25/2021 11:56 AM 5.0 4.8 - 5.6 % Final    Comment:    (NOTE)         Prediabetes: 5.7 - 6.4         Diabetes: >6.4         Glycemic control for adults with diabetes: <7.0   10/28/2019 06:10 PM 6.1 (H) 4.8 - 5.6 % Final    Comment:    (NOTE) Pre diabetes:          5.7%-6.4% Diabetes:              >6.4% Glycemic control for   <7.0% adults with diabetes     CBG: No results for input(s): GLUCAP in the last 168 hours.   Past Medical History   Past Medical History:  Diagnosis Date   Bipolar 1 disorder ([place])    Depression    Dizziness and giddiness 02/01/2016   Herpes genitalia    HIV disease ([place])    Hypertension    Migraine headache 02/01/2016   Peripheral neuropathy 10/01/2019   PTSD (post-traumatic stress disorder)    Schizoaffective disorder ([place])    Seizures ([place])       Surgical History    Past Surgical History:  Procedure Laterality Date   BACK SURGERY     BIOPSY  02/26/2021   Procedure: BIOPSY;  Surgeon: Otis Brace, MD;  [facility];  Service: Gastroenterology;;   COLONOSCOPY WITH PROPOFOL [name] 02/26/2021   Procedure: COLONOSCOPY WITH PROPOFOL;  Surgeon: Otis Brace, MD;  [facility];  Service: Gastroenterology;  Laterality: [name];   ESOPHAGOGASTRODUODENOSCOPY (EGD) WITH PROPOFOL [name] 02/26/2021   Procedure: ESOPHAGOGASTRODUODENOSCOPY (EGD) WITH PROPOFOL;  Surgeon:  Otis Brace, MD;  [facility];  Service: Gastroenterology;  Laterality: [name];   ESOPHAGOGASTRODUODENOSCOPY (EGD) WITH PROPOFOL [name] 05/18/2021   Procedure: ESOPHAGOGASTRODUODENOSCOPY (EGD) WITH PROPOFOL;  Surgeon: Otis Brace, MD;  [facility];  Service: Gastroenterology;  Laterality: [name];   HAND SURGERY        Social History   Social History   Socioeconomic History   Marital status: Single    Spouse name: Not on file   Number of children: Not on file   Years of education: Not on file   Highest education level: Not on file  Occupational History   Occupation: [employer]  Tobacco Use   Smoking status: Every Day    Packs/day: 1.00    Types: Cigarettes   Smokeless tobacco: Never  Vaping Use   Vaping Use: Never used  Substance and Sexual Activity   Alcohol use: Yes    Alcohol/week: 14.0 standard drinks    Types: 14 Cans of beer per week    Comment: 5-6 40oz per day   Drug use: No   Sexual activity: Not Currently    Birth control/protection: Condom  Other Topics Concern   Not on file  Social History Narrative   Lives at home alone   Right-handed   Drinks 2-3 sodas per day   Social Determinants of Health   Financial Resource Strain: Not on file  Food Insecurity: Not on file  Transportation Needs: Not on file  Physical Activity: Not on file  Stress: Not on file  Social Connections: Not on file      Family History  Family History  Problem Relation Age of Onset   Alcohol abuse Mother    Schizophrenia Father    Depression Father    Alcohol abuse Father    Alcohol abuse Paternal Uncle    Alcohol abuse Paternal Uncle    family history includes Alcohol abuse in his father, mother, paternal uncle, and paternal uncle; Depression in his father; Schizophrenia in his father.    Allergies Allergies  Allergen Reactions   Dapsone Other (See Comments)    Per centricity "G6PD deficient"   Primaquine Phosphate Other (See Comments)    Per  Centricity "G6PD deficient"      Current Medications  Current Facility-Administered Medications:    0.9 %  sodium chloride infusion (Manually program via Guardrails IV Fluids), , Intravenous, Once, Corey Harold, NP   norepinephrine (LEVOPHED) 46m in 2524m(0.016 mg/mL) premix infusion, 0-40 mcg/min, Intravenous, Continuous, Hong, JoGreggory BrandyMD, Last Rate: 37.5 mL/hr at 10/07/21 0058, 10 mcg/min at 10/07/21 0058  Current Outpatient Medications:    albuterol (PROVENTIL HFA;VENTOLIN HFA) 108 (90 BASE) MCG/ACT inhaler, Inhale 2 puffs into the lungs every 6 (six) hours as needed for wheezing or shortness of breath., Disp: , Rfl:    alprazolam (XANAX) 2 MG tablet, Take 2 mg by mouth 3 (three) times daily., Disp: , Rfl:    benzonatate (TESSALON) 100 MG capsule, Take 200 mg by mouth every 8 (eight) hours as needed for cough., Disp: , Rfl:    bictegravir-emtricitabine-tenofovir AF (BIKTARVY) 50-200-25 MG TABS tablet, Take 1 tablet by mouth daily. , Disp: , Rfl:    cyclobenzaprine (FLEXERIL) 10 MG tablet, Take 10 mg by mouth 3 (three) times daily as needed for muscle spasms., Disp: , Rfl:    diphenoxylate-atropine (LOMOTIL) 2.5-0.025 MG tablet, Take 1 tablet by mouth every 8 (eight) hours as needed for diarrhea or loose stools., Disp: , Rfl:    doxycycline (VIBRA-TABS) 100 MG tablet, Take 1 tablet (100 mg total) by mouth every 12 (twelve) hours., Disp: 60 tablet, Rfl: 1   escitalopram (LEXAPRO) 20 MG tablet, Take 1 tablet by mouth daily., Disp: , Rfl:    famotidine (PEPCID) 40 MG tablet, Take 40 mg by mouth every morning., Disp: , Rfl:    folic acid (FOLVITE) 1 MG tablet, Take 1 mg by mouth daily., Disp: , Rfl:    food thickener (SIMPLYTHICK, NECTAR/LEVEL 2/MILDLY THICK,) GEL, Take 1 packet by mouth as needed., Disp: 10 packet, Rfl: 0   furosemide (LASIX) 40 MG tablet, Take 40 mg by mouth daily as needed for fluid., Disp: , Rfl:    haloperidol (HALDOL) 5 MG tablet, Take 5 mg by mouth 2 (two) times  daily., Disp: , Rfl:    HYDROcodone-acetaminophen (NORCO/VICODIN) 5-325 MG tablet, Take 1 tablet by mouth 2 (two) times daily as needed., Disp: , Rfl:    ibuprofen (ADVIL) 800 MG tablet, Take 800 mg by mouth 3 (three) times daily., Disp: , Rfl:    lactulose (CHRONULAC) 10 GM/15ML solution, Take 30 mLs (20 g total) by mouth 2 (two) times daily., Disp: 236 mL, Rfl: 0   lurasidone 120 MG TABS, Take 1 tablet (120 mg total) by mouth daily., Disp: 30 tablet, Rfl: 0   magnesium oxide (MAG-OX) 400 (240 Mg) MG tablet, Take 1 tablet (400 mg total) by mouth daily., Disp: 30 tablet, Rfl: 0   meclizine (ANTIVERT) 25 MG tablet, Take 25 mg by mouth every 8 (eight) hours as needed., Disp: , Rfl:    midodrine (PROAMATINE) 10 MG  tablet, Take 1 tablet (10 mg total) by mouth 3 (three) times daily with meals., Disp: 90 tablet, Rfl: 0   Multiple Vitamin (MULTIVITAMIN WITH MINERALS) TABS tablet, Take 1 tablet by mouth daily., Disp: 30 tablet, Rfl: 0   nicotine (NICODERM CQ - DOSED IN MG/24 HOURS) 14 mg/24hr patch, Place 1 patch (14 mg total) onto the skin daily., Disp: 28 patch, Rfl: 0   Nutritional Supplements (,FEEDING SUPPLEMENT, PROSOURCE PLUS) liquid, Take 30 mLs by mouth 4 (four) times daily., Disp: 887 mL, Rfl: 0   ondansetron (ZOFRAN) 8 MG tablet, Take 8 mg by mouth every 8 (eight) hours as needed for nausea/vomiting, nausea or vomiting., Disp: , Rfl:    pantoprazole (PROTONIX) 40 MG tablet, Take 1 tablet (40 mg total) by mouth daily., Disp: 30 tablet, Rfl: 0   thiamine 100 MG tablet, Take 2.5 tablets (250 mg total) by mouth daily., Disp: 30 tablet, Rfl: 0   thiamine 100 MG tablet, Take 1 tablet (100 mg total) by mouth daily., Disp: 30 tablet, Rfl: 0   topiramate (TOPAMAX) 100 MG tablet, Take 100 mg by mouth at bedtime., Disp: , Rfl:    traZODone (DESYREL) 100 MG tablet, Take 2 tablets (200 mg total) by mouth at bedtime as needed for sleep., Disp: 30 tablet, Rfl: 0   traZODone (DESYREL) 50 MG tablet, Take 200 mg  by mouth at bedtime., Disp: , Rfl:    valACYclovir (VALTREX) 1000 MG tablet, Take 1 tablet (1,000 mg total) by mouth daily., Disp: , Rfl:    Home Medications  Prior to Admission medications   Medication Sig Start Date End Date Taking? Authorizing Provider  albuterol (PROVENTIL HFA;VENTOLIN HFA) 108 (90 BASE) MCG/ACT inhaler Inhale 2 puffs into the lungs every 6 (six) hours as needed for wheezing or shortness of breath.    [provider]  alprazolam Duanne Moron) 2 MG tablet Take 2 mg by mouth 3 (three) times daily.    [provider]  benzonatate (TESSALON) 100 MG capsule Take 200 mg by mouth every 8 (eight) hours as needed for cough. 10/23/19   [provider]  bictegravir-emtricitabine-tenofovir AF (BIKTARVY) 50-200-25 MG TABS tablet Take 1 tablet by mouth daily.  02/24/17   [provider]  cyclobenzaprine (FLEXERIL) 10 MG tablet Take 10 mg by mouth 3 (three) times daily as needed for muscle spasms. 08/19/21   [provider]  diphenoxylate-atropine (LOMOTIL) 2.5-0.025 MG tablet Take 1 tablet by mouth every 8 (eight) hours as needed for diarrhea or loose stools. 02/16/21   [provider]  doxycycline (VIBRA-TABS) 100 MG tablet Take 1 tablet (100 mg total) by mouth every 12 (twelve) hours. 08/31/21 10/30/21  Raiford Noble Latif, DO  escitalopram (LEXAPRO) 20 MG tablet Take 1 tablet by mouth daily. 02/16/21   [provider]  famotidine (PEPCID) 40 MG tablet Take 40 mg by mouth every morning. 08/19/21   [provider]  folic acid (FOLVITE) 1 MG tablet Take 1 mg by mouth daily. 10/03/19   [provider]  food thickener (SIMPLYTHICK, NECTAR/LEVEL 2/MILDLY THICK,) GEL Take 1 packet by mouth as needed. 08/31/21   Raiford Noble Latif, DO  furosemide (LASIX) 40 MG tablet Take 40 mg by mouth daily as needed for fluid. 02/27/20   [provider]  haloperidol (HALDOL) 5 MG tablet Take 5 mg by mouth 2 (two) times daily.  08/19/21   [provider]  HYDROcodone-acetaminophen (NORCO/VICODIN) 5-325 MG tablet Take 1 tablet by mouth 2 (two) times daily as  needed. 08/09/21   [provider]  ibuprofen (ADVIL) 800 MG tablet Take 800 mg by mouth 3 (three) times daily. 09/09/21   [provider]  lactulose (CHRONULAC) 10 GM/15ML solution Take 30 mLs (20 g total) by mouth 2 (two) times daily. 08/31/21   Raiford Noble Latif, DO  lurasidone 120 MG TABS Take 1 tablet (120 mg total) by mouth daily. 05/15/15   Niel Hummer, NP  magnesium oxide (MAG-OX) 400 (240 Mg) MG tablet Take 1 tablet (400 mg total) by mouth daily. 05/19/21   Geradine Girt, DO  meclizine (ANTIVERT) 25 MG tablet Take 25 mg by mouth every 8 (eight) hours as needed. 06/18/21   [provider]  midodrine (PROAMATINE) 10 MG tablet Take 1 tablet (10 mg total) by mouth 3 (three) times daily with meals. 08/31/21   Raiford Noble Latif, DO  Multiple Vitamin (MULTIVITAMIN WITH MINERALS) TABS tablet Take 1 tablet by mouth daily. 09/01/21   Sheikh, Omair Latif, DO  nicotine (NICODERM CQ - DOSED IN MG/24 HOURS) 14 mg/24hr patch Place 1 patch (14 mg total) onto the skin daily. 09/01/21   Raiford Noble Latif, DO  Nutritional Supplements (,FEEDING SUPPLEMENT, PROSOURCE PLUS) liquid Take 30 mLs by mouth 4 (four) times daily. 08/31/21   Raiford Noble Latif, DO  ondansetron (ZOFRAN) 8 MG tablet Take 8 mg by mouth every 8 (eight) hours as needed for nausea/vomiting, nausea or vomiting. 10/03/19   [provider]  pantoprazole (PROTONIX) 40 MG tablet Take 1 tablet (40 mg total) by mouth daily. 05/20/21   Geradine Girt, DO  thiamine 100 MG tablet Take 2.5 tablets (250 mg total) by mouth daily. 05/20/21   Geradine Girt, DO  thiamine 100 MG tablet Take 1 tablet (100 mg total) by mouth daily. 09/01/21   Raiford Noble Latif, DO  topiramate (TOPAMAX) 100 MG tablet Take 100 mg by mouth at bedtime. 06/18/21   [provider]  traZODone  (DESYREL) 100 MG tablet Take 2 tablets (200 mg total) by mouth at bedtime as needed for sleep. 05/19/21   Geradine Girt, DO  traZODone (DESYREL) 50 MG tablet Take 200 mg by mouth at bedtime. 08/19/21   [provider]  valACYclovir (VALTREX) 1000 MG tablet Take 1 tablet (1,000 mg total) by mouth daily. 05/15/15   Niel Hummer, NP      Critical care time: 45 minutes.  The treatment and management of the patient's condition was required based on the threat of imminent deterioration. This time reflects time spent by the physician evaluating, providing care and managing the critically ill patient's care. The time was spent at the immediate bedside (or on the same floor/unit and dedicated to this patient's care). Time involved in separately billable procedures is NOT included int he critical care time indicated above. Family meeting and update time may be included above if and only if the patient is unable/incompetent to participate in clinical interview and/or decision making, and the discussion was necessary to determining treatment decisions.   [name], MD Board Certified by the [employer], [place]

## 2021-10-07 NOTE — Progress Notes (Signed)
° °  Updates  - Noticed CNS meds were actually restarted at admit -> rpeat QTc improve dfrom 600s to 509 msc -> plan dc haldol for today -> repeat EKG 10/08/21   Also RN reports Also his right calf is red and swollen, left is not -> get duplex LE stat -> will NOT be able to anticoaguate due to low platelets  Recent Labs  Lab 10/06/21 1858 10/07/21 0102 10/07/21 0249  HGB 7.4*  --  5.8*  HCT 23.1*  --  17.7*  WBC 11.5*  --  11.4*  PLT 17* 21* 20*   Recent Labs  Lab 10/06/21 1858 10/07/21 0102 10/07/21 1248  INR 1.6* 1.7* 1.5*       SIGNATURE    Dr. Brand Males, M.D., F.C.C.P,  Pulmonary and Critical Care Medicine Staff Physician, [facility] Director - Interstitial Lung Disease  Program  [facility] [place] [facility] [place], [place], [address]  NPI Number:  NPI #[id number]  Pager: [phone], If no answer  -> Check AMION or Try [phone] Telephone (clinical office): [phone] Telephone (research): [phone]  1:25 PM 10/07/2021

## 2021-10-08 DIAGNOSIS — A419 Sepsis, unspecified organism: Secondary | ICD-10-CM | POA: Diagnosis not present

## 2021-10-08 DIAGNOSIS — R6521 Severe sepsis with septic shock: Secondary | ICD-10-CM | POA: Diagnosis not present

## 2021-10-08 LAB — COMPREHENSIVE METABOLIC PANEL
ALT: 47 U/L — ABNORMAL HIGH (ref 0–44)
AST: 38 U/L (ref 15–41)
Albumin: <1.5 g/dL — ABNORMAL LOW (ref 3.5–5.0)
Alkaline Phosphatase: 240 U/L — ABNORMAL HIGH (ref 38–126)
Anion gap: 5 (ref 5–15)
BUN: <5 mg/dL — ABNORMAL LOW (ref 6–20)
CO2: 25 mmol/L (ref 22–32)
Calcium: 7.5 mg/dL — ABNORMAL LOW (ref 8.9–10.3)
Chloride: 105 mmol/L (ref 98–111)
Creatinine, Ser: 0.86 mg/dL (ref 0.61–1.24)
GFR, Estimated: >60 mL/min (ref >60)
Glucose, Bld: 119 mg/dL — ABNORMAL HIGH (ref 70–99)
Potassium: 3.4 mmol/L — ABNORMAL LOW (ref 3.5–5.1)
Sodium: 135 mmol/L (ref 135–145)
Total Bilirubin: 2 mg/dL — ABNORMAL HIGH (ref 0.3–1.2)
Total Protein: 5.8 g/dL — ABNORMAL LOW (ref 6.5–8.1)

## 2021-10-08 LAB — PREPARE PLATELET PHERESIS
Unit division: 0
Unit division: 0

## 2021-10-08 LAB — BLOOD GAS, ARTERIAL
Acid-Base Excess: 1.2 mmol/L (ref 0.0–2.0)
Bicarbonate: 25.5 mmol/L (ref 20.0–28.0)
O2 Saturation: 99.5 %
Patient temperature: 98.6
pCO2 arterial: 41.7 mmHg (ref 32.0–48.0)
pH, Arterial: 7.404 (ref 7.350–7.450)
pO2, Arterial: 164 mmHg — ABNORMAL HIGH (ref 83.0–108.0)

## 2021-10-08 LAB — TROPONIN I (HIGH SENSITIVITY): Troponin I (High Sensitivity): 29 ng/L — ABNORMAL HIGH (ref <18)

## 2021-10-08 LAB — BPAM PLATELET PHERESIS
Blood Product Expiration Date: 202212292359
Blood Product Expiration Date: 202212312359
ISSUE DATE / TIME: 202212290236
ISSUE DATE / TIME: 202212290411
Unit Type and Rh: 5100
Unit Type and Rh: 6200

## 2021-10-08 LAB — CBC
HCT: 22.5 % — ABNORMAL LOW (ref 39.0–52.0)
Hemoglobin: 7.5 g/dL — ABNORMAL LOW (ref 13.0–17.0)
MCH: 32.5 pg (ref 26.0–34.0)
MCHC: 33.3 g/dL (ref 30.0–36.0)
MCV: 97.4 fL (ref 80.0–100.0)
Platelets: 55 10*3/uL — ABNORMAL LOW (ref 150–400)
RBC: 2.31 MIL/uL — ABNORMAL LOW (ref 4.22–5.81)
RDW: 18.1 % — ABNORMAL HIGH (ref 11.5–15.5)
WBC: 14.2 10*3/uL — ABNORMAL HIGH (ref 4.0–10.5)
nRBC: 0 % (ref 0.0–0.2)

## 2021-10-08 LAB — MAGNESIUM: Magnesium: 1.8 mg/dL (ref 1.7–2.4)

## 2021-10-08 LAB — HAPTOGLOBIN: Haptoglobin: 57 mg/dL (ref 17–317)

## 2021-10-08 LAB — PROCALCITONIN: Procalcitonin: 0.55 ng/mL

## 2021-10-08 MED ORDER — ASCORBIC ACID 500 MG PO TABS
500.0000 mg | ORAL_TABLET | Freq: Two times a day (BID) | ORAL | Status: DC
  Filled 2021-10-08: qty 1

## 2021-10-08 MED ORDER — HALOPERIDOL 1 MG PO TABS
2.0000 mg | ORAL_TABLET | Freq: Two times a day (BID) | ORAL | Status: DC
  Administered 2021-10-08: 10:00:00 2 mg via ORAL
  Filled 2021-10-08: qty 2

## 2021-10-08 MED ORDER — LACTATED RINGERS IV BOLUS
500.0000 mL | Freq: Once | INTRAVENOUS | Status: AC
  Administered 2021-10-08: 10:00:00 500 mL via INTRAVENOUS

## 2021-10-08 MED ORDER — ZINC SULFATE 220 (50 ZN) MG PO CAPS
220.0000 mg | ORAL_CAPSULE | Freq: Every day | ORAL | Status: DC
  Filled 2021-10-08: qty 1

## 2021-10-08 MED ORDER — POTASSIUM CHLORIDE CRYS ER 20 MEQ PO TBCR
40.0000 meq | EXTENDED_RELEASE_TABLET | Freq: Once | ORAL | Status: AC
  Administered 2021-10-08: 10:00:00 40 meq via ORAL
  Filled 2021-10-08: qty 2

## 2021-10-08 MED ORDER — THIAMINE HCL 100 MG/ML IJ SOLN
100.0000 mg | Freq: Every day | INTRAMUSCULAR | Status: DC
  Administered 2021-10-08 – 2021-10-18 (×7): 100 mg via INTRAVENOUS
  Filled 2021-10-08 (×7): qty 2

## 2021-10-08 NOTE — Progress Notes (Signed)
Pt complaining of needing to urinate. Bladder scan performed showed 6110m. Pt still refusing foley at this time. Educated patient on risks of urinary retention.

## 2021-10-08 NOTE — H&P (Signed)
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 deep vein thrombosis in the lower extremity. However, portions of this examination were limited- see technologist comments above.  - No cystic structure found in the popliteal fossa.  LEFT: - There is no evidence of deep vein thrombosis involving the visualized veins of the left lower extremity. However, this examination were limited- see technologist comments above.  - No cystic structure found in the popliteal fossa.  *See table(s) above for  measurements and observations. Electronically  signed by Harold Barban MD on 10/07/2021 at 8:03:45 PM.    Final    US Abdomen Limited RUQ (LIVER/GB)  Result Date: 10/07/2021 CLINICAL DATA:  Cirrhosis EXAM: ULTRASOUND ABDOMEN LIMITED RIGHT UPPER QUADRANT COMPARISON:  CT abdomen pelvis 10/06/2021 FINDINGS: Gallbladder: No gallstones or wall thickening visualized. No sonographic Murphy sign noted by sonographer. Common bile duct: Diameter: 2.7 mm Liver: Diffusely increased echogenicity liver without focal liver lesion. Nodular capsular surface the liver suggesting cirrhosis. Small amount of ascites. Portal vein is patent on color Doppler imaging with normal direction of blood flow towards the liver. Other: None. IMPRESSION: Negative for gallstones or biliary dilatation Coarse echotexture liver with nodularity suggestive of cirrhosis. Mild ascites. Electronically Signed   By: Franchot Gallo M.D.   On: 10/07/2021 18:53

## 2021-10-08 NOTE — Plan of Care (Signed)
°  Problem: Education: Goal: Knowledge of General Education information will improve Description: Including pain rating scale, medication(s)/side effects and non-pharmacologic comfort measures Outcome: Not Progressing

## 2021-10-08 NOTE — Progress Notes (Signed)
Nutrition Follow-up  DOCUMENTATION CODES:   Non-severe (moderate) malnutrition in context of chronic illness  INTERVENTION:  - diet advancement as medically feasible. - patient would benefit from small bore NGT placement and TF initiation, if he is agreeable.  - continue order for 30 ml Prosource Plus QID. - will order 500 mg ascorbic acid BID and 220 mg zinc sulfate/day to aid in healing for chronic wounds (cleared by MD).    NUTRITION DIAGNOSIS:   Moderate Malnutrition related to chronic illness (HIV) as evidenced by mild fat depletion, mild muscle depletion. -revised, ongoing  GOAL:   Patient will meet greater than or equal to 90% of their needs -unmet  MONITOR:   PO intake, Supplement acceptance, Labs, Weight trends  REASON FOR ASSESSMENT:   Consult Assessment of nutrition requirement/status  ASSESSMENT:   35 y.o. male with medical history of HIV, HTN, PTSD, bipolar 1 disorder, depression, seizures, migraine headaches, and peripheral neuropathy. He presented to the ED via EMS due to soreness to buttocks.  Patient discussed in rounds this AM. Patient laying in bed with no visitors present at the time of RD visit. Patient was very difficult to arouse despite name call throughout visit and firm rubbing during the course of NFPE. Patient would slightly nod or shake head at times.   He denies abdominal pain or nausea. Per discussion in rounds, plan to advance diet today. This to occur after SLP evaluation, per one-on-one discussion with RN, as patient aspirated on meds in applesauce earlier this afternoon.   Weight today is +2 lb compared to weight on 12/28.   Per notes: - septic shock - Urology recommended Foley but patient refused - chronic alcohol abuse - known hx of dysphagia   Labs reviewed; K: 3.4 mmol/l, BUN: <5 mg/dl, Ca: 7.5 mg/dl, Alk Phos elevated.   Medications reviewed; 20 mg IV pepcid BID, 1 mg folvite/day, 400 mg mag-ox/day, 1 tablet multivitamin with  minerals/day, 40 mg oral protonix/day, 40 mEq Klor-Con x1 dose 12/30, 250 mg oral thiamine/day.  IVF; LR @ 100 ml/hr.     NUTRITION - FOCUSED PHYSICAL EXAM:  Flowsheet Row Most Recent Value  Orbital Region Mild depletion  Upper Arm Region Mild depletion  Thoracic and Lumbar Region Unable to assess  Buccal Region Mild depletion  Temple Region Moderate depletion  Clavicle Bone Region Mild depletion  Clavicle and Acromion Bone Region Mild depletion  Scapular Bone Region Unable to assess  Dorsal Hand No depletion  Patellar Region No depletion  Anterior Thigh Region No depletion  Posterior Calf Region No depletion  Edema (RD Assessment) Moderate  [BLE]  Hair Reviewed  Eyes Unable to assess  [patient would vs could not open eyes throughout entirety of visit]  Mouth Unable to assess  Skin Reviewed  Nails Reviewed       Diet Order:   Diet Order             Diet NPO time specified Except for: Sips with Meds  Diet effective ____                   EDUCATION NEEDS:   Not appropriate for education at this time  Skin:  Skin Assessment: Reviewed RN Assessment ([place] note from 12/29)  Last BM:  PTA/unknown  Height:   Ht Readings from Last 1 Encounters:  10/06/21 6' 3"  (1.905 m)    Weight:   Wt Readings from Last 1 Encounters:  10/08/21 109.9 kg     Estimated Nutritional Needs:  Kcal:  2180-2400 kcal Protein:  110-125 grams Fluid:  >/= 2.3 L/day      Jarome Matin, MS, RD, LDN, CNSC Inpatient Clinical Dietitian RD pager # available in [place]  After hours/weekend pager # available in [facility]

## 2021-10-08 NOTE — Progress Notes (Signed)
Ramaswamy MD notified of low UOP throughout shift- one unmeasured occurrence through shift. Bladder scan showed 266 cc urine at 1530. LR @ 100 ml/hr restarted- patient still refusing foley catheter at this time despite education on risks of urinary retention.

## 2021-10-08 NOTE — Progress Notes (Signed)
[facility] for Infectious Disease   Reason for visit: Follow up on shock  Interval History: he remains on pressor support, WBC mildly elevated, afebrile.   Procalcitonin wnl.  Day 3 total antibiotics  Physical Exam: Constitutional:  Vitals:   10/08/21 1515 10/08/21 1530  BP: 114/78 114/82  Pulse: 73 74  Resp: 18 17  Temp:    SpO2: 97% 99%   patient appears in NAD; fatigued appearing Respiratory: Normal respiratory effort; CTA B Cardiovascular: RRR GI: soft, nt, nd Neuro: slowed, answers questions appropriately  Review of Systems: Constitutional: negative for fevers and chills Gastrointestinal: negative for diarrhea  Lab Results  Component Value Date   WBC 14.2 (H) 10/08/2021   HGB 7.5 (L) 10/08/2021   HCT 22.5 (L) 10/08/2021   MCV 97.4 10/08/2021   PLT 55 (L) 10/08/2021    Lab Results  Component Value Date   CREATININE 0.86 10/08/2021   BUN <5 (L) 10/08/2021   NA 135 10/08/2021   K 3.4 (L) 10/08/2021   CL 105 10/08/2021   CO2 25 10/08/2021    Lab Results  Component Value Date   ALT 47 (H) 10/08/2021   AST 38 10/08/2021   ALKPHOS 240 (H) 10/08/2021     Microbiology: Recent Results (from the past 240 hour(s))  Blood Culture (routine x 2)     Status: None (Preliminary result)   Collection Time: 10/06/21  6:55 PM   Specimen: BLOOD  Result Value Ref Range Status   Specimen Description   Final    BLOOD LEFT ANTECUBITAL Performed at [facility], [place] [address], [place], [place] [address]    Special Requests   Final    BOTTLES DRAWN AEROBIC AND ANAEROBIC Blood Culture results may not be optimal due to an inadequate volume of blood received in culture bottles Performed at [place] [address], [place], [place] [address]    Culture   Final    NO GROWTH 2 DAYS Performed at [place] [address] [place], [place] [address]    Report Status PENDING  Incomplete  Blood Culture (routine x 2)      Status: None (Preliminary result)   Collection Time: 10/06/21  6:55 PM   Specimen: BLOOD  Result Value Ref Range Status   Specimen Description   Final    BLOOD RIGHT ANTECUBITAL Performed at [place] [address] [place], [place] [address]    Special Requests   Final    BOTTLES DRAWN AEROBIC AND ANAEROBIC Blood Culture adequate volume Performed at [place] [address] [place], [place] [address]    Culture   Final    NO GROWTH 2 DAYS Performed at [place] [address], [place], [place] [address]    Report Status PENDING  Incomplete  Resp Panel by RT-PCR (Flu A&B, Covid) Nasopharyngeal Swab     Status: None   Collection Time: 10/07/21  1:02 AM   Specimen: Nasopharyngeal Swab; Nasopharyngeal(NP) swabs in vial transport medium  Result Value Ref Range Status   SARS Coronavirus 2 by RT PCR NEGATIVE NEGATIVE Final    Comment: (NOTE) SARS-CoV-2 target nucleic acids are NOT DETECTED.  The SARS-CoV-2 RNA is generally detectable in upper respiratory specimens during the acute phase of infection. The lowest concentration of SARS-CoV-2 viral copies this assay can detect is 138 copies/mL. A negative result does not preclude SARS-Cov-2 infection and should not be used as the  sole basis for treatment or other patient management decisions. A negative result may occur with  improper specimen collection/handling, submission of specimen other than nasopharyngeal swab, presence of viral mutation(s) within the areas targeted by this assay, and inadequate number of viral copies(<138 copies/mL). A negative result must be combined with clinical observations, patient history, and epidemiological information. The expected result is Negative.  Fact Sheet for Patients:  EntrepreneurPulse.com.au  Fact Sheet for Healthcare Providers:  IncredibleEmployment.be  This test is no t yet approved or cleared  by the [place] FDA and  has been authorized for detection and/or diagnosis of SARS-CoV-2 by FDA under an Emergency Use Authorization (EUA). This EUA will remain  in effect (meaning this test can be used) for the duration of the COVID-19 declaration under Section 564(b)(1) of the Act, 21 U.S.C.section 360bbb-3(b)(1), unless the authorization is terminated  or revoked sooner.       Influenza A by PCR NEGATIVE NEGATIVE Final   Influenza B by PCR NEGATIVE NEGATIVE Final    Comment: (NOTE) The Xpert Xpress SARS-CoV-2/FLU/RSV plus assay is intended as an aid in the diagnosis of influenza from Nasopharyngeal swab specimens and should not be used as a sole basis for treatment. Nasal washings and aspirates are unacceptable for Xpert Xpress SARS-CoV-2/FLU/RSV testing.  Fact Sheet for Patients: EntrepreneurPulse.com.au  Fact Sheet for Healthcare Providers: IncredibleEmployment.be  This test is not yet approved or cleared by the [place] FDA and has been authorized for detection and/or diagnosis of SARS-CoV-2 by FDA under an Emergency Use Authorization (EUA). This EUA will remain in effect (meaning this test can be used) for the duration of the COVID-19 declaration under Section 564(b)(1) of the Act, 21 U.S.C. section 360bbb-3(b)(1), unless the authorization is terminated or revoked.  Performed at [facility], [place] [address] [place], [place] [address]   Urine Culture     Status: Abnormal (Preliminary result)   Collection Time: 10/07/21  2:00 AM   Specimen: In/Out Cath Urine  Result Value Ref Range Status   Specimen Description   Final    IN/OUT CATH URINE Performed at [place] [address], [place], [place] [address]    Special Requests   Final    NONE Performed at [facility], [place] [address] [place], [place] [address]    Culture 20,000 COLONIES/mL ESCHERICHIA COLI (A)   Final   Report Status PENDING  Incomplete  MRSA Next Gen by PCR, Nasal     Status: Abnormal   Collection Time: 10/07/21  5:00 AM   Specimen: Nasal Mucosa; Nasal Swab  Result Value Ref Range Status   MRSA by PCR Next Gen DETECTED (A) NOT DETECTED Final    Comment: RESULT CALLED TO, READ BACK BY AND VERIFIED WITH: ROBINSON, A.  RN ON 10/07/2021 @ 0916 BY MECIAL J. (NOTE) The GeneXpert MRSA Assay (FDA approved for NASAL specimens only), is one component of a comprehensive MRSA colonization surveillance program. It is not intended to diagnose MRSA infection nor to guide or monitor treatment for MRSA infections. Test performance is not FDA approved in patients less than 89 years old. Performed at [facility], [place] [address] [place], [place] [address]     Impression/Plan:  1. Shock - unclear etiology.  Septic shock a possibility and on vancomycin, cefepime and metronidazole.  Will stop metronidazole, unlikely anaerobic.   Not sure that this is infectious in etiology since it has happened before. Procalcitonin does not suggest infection.  No positive cultures.   Differential of shock includes infection but ? If it is from another etiology.    I would also look at etiologies related to his severe malnutrition including refeeding, thiamine deficiency with shock/beri beri/wernickes.     He is getting thiamine but I will give a dose IV.   Check magnesium, phosphorus - I see that is ordered for tomorrow  2.  Bladder gas - unclear etiology.  Not much inflammation in the UA.  He has had microscopic hematuria.  Consider urology input  3.  HIV - will continue with [employer]

## 2021-10-08 NOTE — Progress Notes (Addendum)
During noon medication administration, patient developed strong cough after taking medication with apple sauce. Patient able to clear airway-Drew Kenton Kingfisher NP notified and SLP consult placed for concerns of aspiration. SpO2 97% on [address] L [address] at this time.

## 2021-10-08 NOTE — Progress Notes (Signed)
° °  Update Not tolerating even d3 diet - severe dyphagia Psych asdvised to dc his haldol and simplified his psych med regimen He is mostly looks somnolen but abg ok and he gets awake and normal when stimulated - refused foley Will continue to monitor    SIGNATURE    Dr. Brand Males, M.D., F.C.C.P,  Pulmonary and Critical Care Medicine Staff Physician, [place] Director - Interstitial Lung Disease  Program  Pulmonary [place] at [place], [place], [address]  NPI Number:  NPI #[id number]  Pager: [phone], If no answer  -> Check AMION or Try [phone] Telephone (clinical office): [phone] Telephone (research): [phone]  6:27 PM 10/08/2021

## 2021-10-08 NOTE — Consult Note (Signed)
Face To Face Psychiatry Consult  Patient Identification:  [name] Date of Evaluation:  10/08/2021 Referring Provider:Dr Ramsawami  History of Present Illness: Patient is a 35 year old African-American with history of HIV, PTSD, schizoaffective disorder, hypertension presented to the emergency department with complaints of sore on his buttocks.  He recently had a visit from home health services who had concern about the service.  Psychiatry consult was called and as patient taking too many medication and prolong QTC.  Patient seen, chart and labs reviewed. Patient admitted history of psychiatric illness for a long time.  He has a psychiatrist Dr. Acquanetta Chain at [facility]. who is managing his medication.  Currently he is on Xanax 2 mg 3 times a day, Latuda 120 mg daily, Lexapro 20 mg daily, trazodone 200 mg at bedtime, Topamax 100 mg daily and Haldol 5 mg twice a day.  These are home medication.  Patient denies any current hallucination, paranoia, suicidal thoughts.  Patient appears ill-looking and reported he is feeling tired.  He is very soft spoken and his speech is very slow with decreased tone.  He lives by himself.  He endorsed compliant with medication.  When asked about his current psychotropic medication, patient has difficulty remembering things reported he has been taking the medicine as prescribed.  Denies any irritability, mood swings.  In the hospital he is not agitated and there were no behavior problems.  He is rather calm and cooperative but very soft spoken.  As per chart patient was admitted in August for acute encephalopathy and at that time he was drinking alcohol.  Patient denies any current use of alcohol.  His discharge medication at that time does not have trazodone Topamax and Haldol.  Past Psychiatric History:  History of schizoaffective disorder, anxiety disorder, PTSD.  History of multiple hospitalization and as per chart last inpatient in October 2019.   Currently followed by Dr. Acquanetta Chain at [facility].  Is the patient at risk to self? No. Has the patient been a risk to self in the past 6 months? No. Has the patient been a risk to self within the distant past? No. Is the patient a risk to others No. Has the patient been a risk to others in the past 6 months? No. Has the patient been a risk to others within the distant past? No.     Past Medical History:     Past Medical History:  Diagnosis Date   Bipolar 1 disorder ([place])    Depression    Dizziness and giddiness 02/01/2016   Herpes genitalia    HIV disease ([facility])    Hypertension    Migraine headache 02/01/2016   Peripheral neuropathy 10/01/2019   PTSD (post-traumatic stress disorder)    Schizoaffective disorder ([place])    Seizures ([place])        Past Surgical History:  Procedure Laterality Date   BACK SURGERY     BIOPSY  02/26/2021   Procedure: BIOPSY;  Surgeon: Otis Brace, MD;  Location: [facility];  Service: Gastroenterology;;   COLONOSCOPY WITH PROPOFOL [name] 02/26/2021   Procedure: COLONOSCOPY WITH PROPOFOL;  Surgeon: Otis Brace, MD;  Location: [facility];  Service: Gastroenterology;  Laterality: [name];   ESOPHAGOGASTRODUODENOSCOPY (EGD) WITH PROPOFOL [name] 02/26/2021   Procedure: ESOPHAGOGASTRODUODENOSCOPY (EGD) WITH PROPOFOL;  Surgeon: Otis Brace, MD;  [facility];  Service: Gastroenterology;  Laterality: [name];   ESOPHAGOGASTRODUODENOSCOPY (EGD) WITH PROPOFOL [name] 05/18/2021   Procedure: ESOPHAGOGASTRODUODENOSCOPY (EGD) WITH PROPOFOL;  Surgeon: Otis Brace, MD;  Location[facility];  Service: Gastroenterology;  Laterality: [name];   HAND SURGERY      Labs: Recent Results (from the past 2160 hour(s))  Blood Culture (routine x 2)     Status: None   Collection Time: 08/23/21  7:40 PM   Specimen: BLOOD  Result Value Ref Range   Specimen Description      BLOOD RIGHT ANTECUBITAL Performed at [facility], [place]  [address] [place], [place] [address]    Special Requests      BOTTLES DRAWN AEROBIC AND ANAEROBIC Blood Culture results may not be optimal due to an inadequate volume of blood received in culture bottles Performed at [facility], [place] [address], [place], [place] [address]    Culture      NO GROWTH 5 DAYS Performed at [facility], [place] [address] [place], [place] [address]    Report Status 08/29/2021 FINAL   Lactic acid, plasma     Status: Abnormal   Collection Time: 08/23/21  7:56 PM  Result Value Ref Range   Lactic Acid, Venous 3.5 (HH) 0.5 - 1.9 mmol/L    Comment: CRITICAL RESULT CALLED TO, READ BACK BY AND VERIFIED WITH:  JESSICA PRAY RN 08/23/21 @ 2036 VS Performed at [facility], [place] [address] [place], [place] [address]   Comprehensive metabolic panel     Status: Abnormal   Collection Time: 08/23/21  7:56 PM  Result Value Ref Range   Sodium 129 (L) 135 - 145 mmol/L   Potassium 3.7 3.5 - 5.1 mmol/L   Chloride 101 98 - 111 mmol/L   CO2 21 (L) 22 - 32 mmol/L   Glucose, Bld 122 (H) 70 - 99 mg/dL    Comment: Glucose reference range applies only to samples taken after fasting for at least 8 hours.   BUN 8 6 - 20 mg/dL   Creatinine, Ser 1.26 (H) 0.61 - 1.24 mg/dL   Calcium 7.7 (L) 8.9 - 10.3 mg/dL   Total Protein 6.6 6.5 - 8.1 g/dL   Albumin <1.5 (L) 3.5 - 5.0 g/dL   AST 51 (H) 15 - 41 U/L   ALT 68 (H) 0 - 44 U/L   Alkaline Phosphatase 279 (H) 38 - 126 U/L   Total Bilirubin 0.9 0.3 - 1.2 mg/dL   GFR, Estimated >60 >60 mL/min    Comment: (NOTE) Calculated using the CKD-EPI Creatinine Equation (2021)    Anion gap 7 5 - 15    Comment: Performed at [facility], [place] [address] [place], [place] [address]  CBC WITH DIFFERENTIAL     Status: Abnormal   Collection Time: 08/23/21  7:56 PM  Result Value Ref Range   WBC 15.0 (H) 4.0 - 10.5 K/uL   RBC 1.99 (L) 4.22 - 5.81 MIL/uL   Hemoglobin 6.7 (LL) 13.0 - 17.0 g/dL     Comment: REPEATED TO VERIFY THIS CRITICAL RESULT HAS VERIFIED AND BEEN CALLED TO LOU MERRICKS RN. BY TAMEECO CALDWELL ON 11 14 2022 AT 2119, AND HAS BEEN READ BACK.     HCT 20.8 (L) 39.0 - 52.0 %   MCV 104.5 (H) 80.0 - 100.0 fL   MCH 33.7 26.0 - 34.0 pg   MCHC 32.2 30.0 - 36.0 g/dL   RDW 18.9 (H) 11.5 - 15.5 %   Platelets 114 (L) 150 - 400 K/uL    Comment: SPECIMEN CHECKED FOR CLOTS Immature Platelet Fraction may be clinically indicated, consider ordering this  additional test [id number] REPEATED TO VERIFY PLATELET COUNT CONFIRMED BY SMEAR    nRBC 0.0 0.0 - 0.2 %   Neutrophils Relative % 80 %   Neutro Abs 12.0 (H) 1.7 - 7.7 K/uL   Lymphocytes Relative 13 %   Lymphs Abs 1.9 0.7 - 4.0 K/uL   Monocytes Relative 6 %   Monocytes Absolute 0.9 0.1 - 1.0 K/uL   Eosinophils Relative 0 %   Eosinophils Absolute 0.1 0.0 - 0.5 K/uL   Basophils Relative 0 %   Basophils Absolute 0.0 0.0 - 0.1 K/uL   Immature Granulocytes 1 %   Abs Immature Granulocytes 0.14 (H) 0.00 - 0.07 K/uL    Comment: Performed at [facility], [place] [address], [place], [place] [address]  Protime-INR     Status: Abnormal   Collection Time: 08/23/21  7:56 PM  Result Value Ref Range   Prothrombin Time 17.8 (H) 11.4 - 15.2 seconds   INR 1.5 (H) 0.8 - 1.2    Comment: (NOTE) INR goal varies based on device and disease states. Performed at [facility], [place] [address], [place],  [address]   APTT     Status: None   Collection Time: 08/23/21  7:56 PM  Result Value Ref Range   aPTT 35 24 - 36 seconds    Comment: Performed at [facility], [place] [address], [place], [place] [address]  T-helper cells (CD4) count (not at [facility])     Status: Abnormal   Collection Time: 08/23/21  7:56 PM  Result Value Ref Range   CD4 T Cell Abs 1,169 400 - 1,790 /uL   CD4 % Helper T Cell 66 (H) 33 - 65 %    Comment: Performed at [facility], [place] [address] [place],  [address]  Resp Panel by RT-PCR (Flu A&B, Covid) Nasopharyngeal Swab     Status: None   Collection Time: 08/23/21  9:00 PM   Specimen: Nasopharyngeal Swab; Nasopharyngeal(NP) swabs in vial transport medium  Result Value Ref Range   SARS Coronavirus 2 by RT PCR NEGATIVE NEGATIVE    Comment: (NOTE) SARS-CoV-2 target nucleic acids are NOT DETECTED.  The SARS-CoV-2 RNA is generally detectable in upper respiratory specimens during the acute phase of infection. The lowest concentration of SARS-CoV-2 viral copies this assay can detect is 138 copies/mL. A negative result does not preclude SARS-Cov-2 infection and should not be used as the sole basis for treatment or other patient management decisions. A negative result may occur with  improper specimen collection/handling, submission of specimen other than nasopharyngeal swab, presence of viral mutation(s) within the areas targeted by this assay, and inadequate number of viral copies(<138 copies/mL). A negative result must be combined with clinical observations, patient history, and epidemiological information. The expected result is Negative.  Fact Sheet for Patients:  EntrepreneurPulse.com.au  Fact Sheet for Healthcare Providers:  IncredibleEmployment.be  This test is no t yet approved or cleared by the [place] FDA and  has been authorized for detection and/or diagnosis of SARS-CoV-2 by FDA under an Emergency Use Authorization (EUA). This EUA will remain  in effect (meaning this test can be used) for the duration of the COVID-19 declaration under Section 564(b)(1) of the Act, 21 U.S.C.section 360bbb-3(b)(1), unless the authorization is terminated  or revoked sooner.       Influenza A by PCR NEGATIVE NEGATIVE   Influenza B by PCR NEGATIVE NEGATIVE    Comment: (NOTE) The Xpert Xpress SARS-CoV-2/FLU/RSV  plus assay is intended as an aid in the diagnosis of influenza from  Nasopharyngeal swab specimens and should not be used as a sole basis for treatment. Nasal washings and aspirates are unacceptable for Xpert Xpress SARS-CoV-2/FLU/RSV testing.  Fact Sheet for Patients: EntrepreneurPulse.com.au  Fact Sheet for Healthcare Providers: IncredibleEmployment.be  This test is not yet approved or cleared by the [place] FDA and has been authorized for detection and/or diagnosis of SARS-CoV-2 by FDA under an Emergency Use Authorization (EUA). This EUA will remain in effect (meaning this test can be used) for the duration of the COVID-19 declaration under Section 564(b)(1) of the Act, 21 U.S.C. section 360bbb-3(b)(1), unless the authorization is terminated or revoked.  Performed at [facility], [place] [address] [place], [place] [address]   Type and screen     Status: None   Collection Time: 08/23/21 10:04 PM  Result Value Ref Range   ABO/RH(D) O POS    Antibody Screen NEG    Sample Expiration 08/26/2021,2359    Unit Number [id number]    Blood Component Type RED CELLS,LR    Unit division 00    Status of Unit ISSUED,FINAL    Transfusion Status OK TO TRANSFUSE    Crossmatch Result Compatible    Unit Number [id number]    Blood Component Type RED CELLS,LR    Unit division 00    Status of Unit ISSUED,FINAL    Transfusion Status OK TO TRANSFUSE    Crossmatch Result      Compatible Performed at [facility], [place] [address], [place], [place] [address]    Unit Number [id number]    Blood Component Type RED CELLS,LR    Unit division 00    Status of Unit ISSUED,FINAL    Transfusion Status OK TO TRANSFUSE    Crossmatch Result Compatible   Prepare RBC (crossmatch)     Status: None   Collection Time: 08/23/21 10:04 PM  Result Value Ref Range   Order Confirmation      ORDER PROCESSED BY BLOOD BANK Performed at [facility], [place] [address]  [place], [place] [address]   BPAM RBC     Status: None   Collection Time: 08/23/21 10:04 PM  Result Value Ref Range   ISSUE DATE / TIME 902409735329    Blood Product Unit Number [id number]    PRODUCT CODE [id number]    Unit Type and Rh 5100    Blood Product Expiration Date 194174081448    ISSUE DATE / TIME [id number]    Blood Product Unit Number [id number]    PRODUCT CODE [id number]    Unit Type and Rh 5100    Blood Product Expiration Date 209470962836    ISSUE DATE / TIME [id number]    Blood Product Unit Number [id number]    Unit Type and Rh 5100    Blood Product Expiration Date 017494496759   Lactic acid, plasma     Status: None   Collection Time: 08/23/21 10:14 PM  Result Value Ref Range   Lactic Acid, Venous 1.9 0.5 - 1.9 mmol/L    Comment: Performed at [facility], [place] [address], [place], [place] [address]  Ethanol     Status: None   Collection Time: 08/24/21  4:15 AM  Result Value Ref Range   Alcohol, Ethyl (B) <10 <10 mg/dL    Comment: (NOTE) Lowest detectable limit for serum alcohol is 10 mg/dL.  For medical purposes only. Performed at [facility], [address] [place], [place] [address]   TSH     Status: None   Collection Time: 08/24/21  4:15 AM  Result Value Ref Range   TSH 2.303 0.350 - 4.500 uIU/mL    Comment: Performed by a 3rd Generation assay with a functional sensitivity of <=0.01 uIU/mL. Performed at [facility], [place] [address] [place], [place] [address]   Magnesium     Status: None   Collection Time: 08/24/21  4:15 AM  Result Value Ref Range   Magnesium 1.9 1.7 - 2.4 mg/dL    Comment: Performed at [facility], [place] [address], [place], [place] [address]  CBC     Status: Abnormal   Collection Time: 08/24/21  4:15 AM  Result Value Ref Range   WBC 15.6 (H) 4.0 - 10.5 K/uL   RBC 2.46 (L) 4.22 - 5.81 MIL/uL   Hemoglobin 8.1 (L) 13.0 - 17.0 g/dL   HCT 24.1 (L) 39.0 -  52.0 %   MCV 98.0 80.0 - 100.0 fL   MCH 32.9 26.0 - 34.0 pg   MCHC 33.6 30.0 - 36.0 g/dL   RDW 19.6 (H) 11.5 - 15.5 %   Platelets 112 (L) 150 - 400 K/uL    Comment: SPECIMEN CHECKED FOR CLOTS Immature Platelet Fraction may be clinically indicated, consider ordering this additional test [id number] CONSISTENT WITH PREVIOUS RESULT REPEATED TO VERIFY    nRBC 0.0 0.0 - 0.2 %    Comment: Performed at [facility], [place] [address] [place], [place] [address]  Comprehensive metabolic panel     Status: Abnormal   Collection Time: 08/24/21  4:15 AM  Result Value Ref Range   Sodium 134 (L) 135 - 145 mmol/L   Potassium 3.8 3.5 - 5.1 mmol/L   Chloride 107 98 - 111 mmol/L   CO2 22 22 - 32 mmol/L   Glucose, Bld 98 70 - 99 mg/dL    Comment: Glucose reference range applies only to samples taken after fasting for at least 8 hours.   BUN 8 6 - 20 mg/dL   Creatinine, Ser 1.02 0.61 - 1.24 mg/dL   Calcium 7.1 (L) 8.9 - 10.3 mg/dL   Total Protein 6.0 (L) 6.5 - 8.1 g/dL   Albumin <1.5 (L) 3.5 - 5.0 g/dL   AST 43 (H) 15 - 41 U/L   ALT 58 (H) 0 - 44 U/L   Alkaline Phosphatase 270 (H) 38 - 126 U/L   Total Bilirubin 1.8 (H) 0.3 - 1.2 mg/dL   GFR, Estimated >60 >60 mL/min    Comment: (NOTE) Calculated using the CKD-EPI Creatinine Equation (2021)    Anion gap 5 5 - 15    Comment: Performed at [facility], [place] [address] [place], [place] [address]  Hepatitis panel, acute     Status: None   Collection Time: 08/24/21  4:15 AM  Result Value Ref Range   Hepatitis B Surface Ag NON REACTIVE NON REACTIVE   HCV Ab NON REACTIVE NON REACTIVE    Comment: (NOTE) Nonreactive HCV antibody screen is consistent with no HCV infections,  unless recent infection is suspected or other evidence exists to indicate HCV infection.     Hep A IgM NON REACTIVE NON REACTIVE   Hep B C IgM NON REACTIVE NON REACTIVE    Comment: Performed at [facility], [place] [address]  [place], [place] [address]  Cortisol-am, blood     Status: None   Collection  Time: 08/24/21  4:15 AM  Result Value Ref Range   Cortisol - AM 13.8 6.7 - 22.6 ug/dL    Comment: Performed at [facility], [place] [address], [place], [place] [address]  Procalcitonin - Baseline     Status: None   Collection Time: 08/24/21  4:15 AM  Result Value Ref Range   Procalcitonin 6.15 ng/mL    Comment:        Interpretation: PCT > 2 ng/mL: Systemic infection (sepsis) is likely, unless other causes are known. (NOTE)       Sepsis PCT Algorithm           Lower Respiratory Tract                                      Infection PCT Algorithm    ----------------------------     ----------------------------         PCT < 0.25 ng/mL                PCT < 0.10 ng/mL          Strongly encourage             Strongly discourage   discontinuation of antibiotics    initiation of antibiotics    ----------------------------     -----------------------------       PCT 0.25 - 0.50 ng/mL            PCT 0.10 - 0.25 ng/mL               OR       >80% decrease in PCT            Discourage initiation of                                            antibiotics      Encourage discontinuation           of antibiotics    ----------------------------     -----------------------------         PCT >= 0.50 ng/mL              PCT 0.26 - 0.50 ng/mL               AND       <80% decrease in PCT              Encourage initiation of                                             antibiotics       Encourage continuation           of antibiotics    ----------------------------     -----------------------------        PCT >= 0.50 ng/mL                  PCT > 0.50 ng/mL               AND         increase in PCT                  Strongly encourage  initiation of antibiotics    Strongly encourage escalation           of antibiotics                                     -----------------------------                                            PCT <= 0.25 ng/mL                                                 OR                                        > 80% decrease in PCT                                      Discontinue / Do not initiate                                             antibiotics  Performed at [place] [address] [place], [place] [address]   Vitamin B12     Status: Abnormal   Collection Time: 08/24/21  4:15 AM  Result Value Ref Range   Vitamin B-12 3,689 (H) 180 - 914 pg/mL    Comment: RESULTS CONFIRMED BY MANUAL DILUTION (NOTE) This assay is not validated for testing neonatal or myeloproliferative syndrome specimens for Vitamin B12 levels. Performed at [facility], [place] [address], [place], [place] [address]   Ammonia     Status: Abnormal   Collection Time: 08/24/21  4:15 AM  Result Value Ref Range   Ammonia 94 (H) 9 - 35 umol/L    Comment: Performed at [facility], [place] [address] [place], [place] [address]  MRSA Next Gen by PCR, Nasal     Status: Abnormal   Collection Time: 08/24/21  5:01 AM   Specimen: Nasal Mucosa; Nasal Swab  Result Value Ref Range   MRSA by PCR Next Gen DETECTED (A) NOT DETECTED    Comment: RESULT CALLED TO, READ BACK BY AND VERIFIED WITH: BULL, E. RN ON 08/24/2021 @ 2725 BY MECIAL J.  (NOTE) The GeneXpert MRSA Assay (FDA approved for NASAL specimens only), is one component of a comprehensive MRSA colonization surveillance program. It is not intended to diagnose MRSA infection nor to guide or monitor treatment for MRSA infections. Test performance is not FDA approved in patients less than 66 years old. Performed at [facility], [place] [address], [place], [place] [address]   Culture, blood (Routine X 2) w Reflex to ID Panel     Status: None   Collection Time: 08/24/21  8:11 AM   Specimen: BLOOD LEFT HAND  Result Value Ref Range   Specimen Description       BLOOD LEFT  HAND Performed at [facility], [place] [address], [place], [place] [address]    Special Requests      BOTTLES DRAWN AEROBIC ONLY Blood Culture adequate volume Performed at [place] [address] [place], [place] [address]    Culture      NO GROWTH 5 DAYS Performed at [facility], [place] [address] [place], [place] [address]    Report Status 08/29/2021 FINAL   Folate     Status: Abnormal   Collection Time: 08/24/21  1:20 PM  Result Value Ref Range   Folate 5.9 (L) >5.9 ng/mL    Comment: Performed at [facility], [place] [address], [place], [place] [address]  Iron and TIBC     Status: None   Collection Time: 08/24/21  1:20 PM  Result Value Ref Range   Iron 63 45 - 182 ug/dL   TIBC NOT CALCULATED 250 - 450 ug/dL   Saturation Ratios NOT CALCULATED 17.9 - 39.5 %   UIBC NOT CALCULATED ug/dL    Comment: Performed at [facility], [place] [address], [place], [place] [address]  Ferritin     Status: Abnormal   Collection Time: 08/24/21  1:20 PM  Result Value Ref Range   Ferritin 714 (H) 24 - 336 ng/mL    Comment: Performed at [facility], [place] [address] [place], [place] [address]  Reticulocytes     Status: Abnormal   Collection Time: 08/24/21  1:20 PM  Result Value Ref Range   Retic Ct Pct 3.3 (H) 0.4 - 3.1 %   RBC. 2.46 (L) 4.22 - 5.81 MIL/uL   Retic Count, Absolute 80.0 19.0 - 186.0 K/uL   Immature Retic Fract 26.3 (H) 2.3 - 15.9 %    Comment: Performed at [facility], [place] [address], [place], [place] [address]  CBC     Status: Abnormal   Collection Time: 08/24/21  6:18 PM  Result Value Ref Range   WBC 15.5 (H) 4.0 - 10.5 K/uL   RBC 2.25 (L) 4.22 - 5.81 MIL/uL   Hemoglobin 7.4 (L) 13.0 - 17.0 g/dL   HCT 22.6 (L) 39.0 - 52.0 %   MCV 100.4 (H) 80.0 - 100.0 fL   MCH 32.9 26.0 - 34.0 pg   MCHC 32.7 30.0 - 36.0 g/dL   RDW 20.6 (H) 11.5 - 15.5 %    Platelets 98 (L) 150 - 400 K/uL    Comment: SPECIMEN CHECKED FOR CLOTS Immature Platelet Fraction may be clinically indicated, consider ordering this additional test [id number] REPEATED TO VERIFY PLATELET COUNT CONFIRMED BY SMEAR    nRBC 0.0 0.0 - 0.2 %    Comment: Performed at [facility], [place] [address] [place],  [address]  Comprehensive metabolic panel     Status: Abnormal   Collection Time: 08/24/21  6:18 PM  Result Value Ref Range   Sodium 136 135 - 145 mmol/L   Potassium 4.2 3.5 - 5.1 mmol/L   Chloride 111 98 - 111 mmol/L   CO2 21 (L) 22 - 32 mmol/L   Glucose, Bld 102 (H) 70 - 99 mg/dL    Comment: Glucose reference range applies only to samples taken after fasting for at least 8 hours.   BUN 9 6 - 20 mg/dL   Creatinine, Ser 1.20 0.61 - 1.24 mg/dL   Calcium 7.0 (L) 8.9 - 10.3 mg/dL   Total Protein 5.7 (L) 6.5 - 8.1 g/dL  Albumin <1.5 (L) 3.5 - 5.0 g/dL   AST 48 (H) 15 - 41 U/L   ALT 54 (H) 0 - 44 U/L   Alkaline Phosphatase 233 (H) 38 - 126 U/L   Total Bilirubin 1.4 (H) 0.3 - 1.2 mg/dL   GFR, Estimated >60 >60 mL/min    Comment: (NOTE) Calculated using the CKD-EPI Creatinine Equation (2021)    Anion gap 4 (L) 5 - 15    Comment: Performed at [facility], [place] [address], [place], [place] [address]  Hemoglobin and hematocrit, blood     Status: Abnormal   Collection Time: 08/24/21 10:54 PM  Result Value Ref Range   Hemoglobin 6.3 (LL) 13.0 - 17.0 g/dL    Comment: REPEATED TO VERIFY THIS CRITICAL RESULT HAS VERIFIED AND BEEN CALLED TO RN A GRAHAM BY ALEXIS Cowley ON 11 15 2022 AT 2306, AND HAS BEEN READ BACK.     HCT 19.9 (L) 39.0 - 52.0 %    Comment: Performed at [facility], [place] [address] [place], [place] [address]  Prepare RBC (crossmatch)     Status: None   Collection Time: 08/24/21 11:15 PM  Result Value Ref Range   Order Confirmation      ORDER PROCESSED BY BLOOD BANK Performed at [place] [address], [place], [place] [address]   Comprehensive metabolic panel     Status: Abnormal   Collection Time: 08/25/21 11:56 AM  Result Value Ref Range   Sodium 134 (L) 135 - 145 mmol/L   Potassium 2.8 (L) 3.5 - 5.1 mmol/L    Comment: NO VISIBLE HEMOLYSIS DELTA CHECK NOTED    Chloride 108 98 - 111 mmol/L   CO2 19 (L) 22 - 32 mmol/L   Glucose, Bld 274 (H) 70 - 99 mg/dL    Comment: Glucose reference range applies only to samples taken after fasting for at least 8 hours.   BUN 7 6 - 20 mg/dL   Creatinine, Ser 1.09 0.61 - 1.24 mg/dL   Calcium 6.4 (LL) 8.9 - 10.3 mg/dL    Comment: CRITICAL RESULT CALLED TO, READ BACK BY AND VERIFIED WITH: BARSLE T. RN ON 08/25/2021 @ 1241 BY MECIAL J.    Total Protein 5.7 (L) 6.5 - 8.1 g/dL   Albumin 1.6 (L) 3.5 - 5.0 g/dL   AST 37 15 - 41 U/L   ALT 41 0 - 44 U/L   Alkaline Phosphatase 195 (H) 38 - 126 U/L   Total Bilirubin 1.6 (H) 0.3 - 1.2 mg/dL   GFR, Estimated >60 >60 mL/min    Comment: (NOTE) Calculated using the CKD-EPI Creatinine Equation (2021)    Anion gap 7 5 - 15    Comment: Performed at [facility], [place] [address], [place], [place] [address]  CBC     Status: Abnormal   Collection Time: 08/25/21 11:56 AM  Result Value Ref Range   WBC 16.5 (H) 4.0 - 10.5 K/uL   RBC 2.45 (L) 4.22 - 5.81 MIL/uL   Hemoglobin 7.9 (L) 13.0 - 17.0 g/dL    Comment: REPEATED TO VERIFY POST TRANSFUSION SPECIMEN DELTA CHECK NOTED    HCT 23.9 (L) 39.0 - 52.0 %   MCV 97.6 80.0 - 100.0 fL   MCH 32.2 26.0 - 34.0 pg   MCHC 33.1 30.0 - 36.0 g/dL   RDW 19.9 (H) 11.5 - 15.5 %   Platelets 94 (L) 150 - 400 K/uL    Comment: Immature  Platelet Fraction may be clinically indicated, consider ordering this additional test [id number] CONSISTENT WITH PREVIOUS RESULT    nRBC 0.1 0.0 - 0.2 %    Comment: Performed at [facility], [place] [address] [place], [place] [address]  Magnesium     Status:  Abnormal   Collection Time: 08/25/21 11:56 AM  Result Value Ref Range   Magnesium 1.4 (L) 1.7 - 2.4 mg/dL    Comment: Performed at [facility], [place] [address], [place], [place] [address]  Ammonia     Status: Abnormal   Collection Time: 08/25/21 11:56 AM  Result Value Ref Range   Ammonia 150 (H) 9 - 35 umol/L    Comment: Performed at [facility], [place] [address] [place], [place] [address]  Osmolality     Status: Abnormal   Collection Time: 08/25/21 11:56 AM  Result Value Ref Range   Osmolality 296 (H) 275 - 295 mOsm/kg    Comment: REPEATED TO VERIFY Performed at [facility] Performed at [facility], [place] [address], [place],  [address]   Hemoglobin A1c     Status: None   Collection Time: 08/25/21 11:56 AM  Result Value Ref Range   Hgb A1c MFr Bld 5.0 4.8 - 5.6 %    Comment: (NOTE)         Prediabetes: 5.7 - 6.4         Diabetes: >6.4         Glycemic control for adults with diabetes: <7.0    Mean Plasma Glucose 97 mg/dL    Comment: (NOTE) Performed At: [facility] [place], [place] [id number] Rush Farmer MD QJ:[id number]   Glucose, capillary     Status: Abnormal   Collection Time: 08/25/21  5:07 PM  Result Value Ref Range   Glucose-Capillary 123 (H) 70 - 99 mg/dL    Comment: Glucose reference range applies only to samples taken after fasting for at least 8 hours.  Glucose, capillary     Status: Abnormal   Collection Time: 08/25/21  9:09 PM  Result Value Ref Range   Glucose-Capillary 106 (H) 70 - 99 mg/dL    Comment: Glucose reference range applies only to samples taken after fasting for at least 8 hours.   Comment 1 Notify RN    Comment 2 Document in Chart   Glucose, capillary     Status: Abnormal   Collection Time: 08/26/21  7:34 AM  Result Value Ref Range   Glucose-Capillary 109 (H) 70 - 99 mg/dL    Comment: Glucose reference range applies only to samples taken after fasting for at  least 8 hours.  Basic metabolic panel     Status: Abnormal   Collection Time: 08/26/21  8:21 AM  Result Value Ref Range   Sodium 137 135 - 145 mmol/L   Potassium 3.5 3.5 - 5.1 mmol/L    Comment: DELTA CHECK NOTED NO VISIBLE HEMOLYSIS    Chloride 109 98 - 111 mmol/L   CO2 23 22 - 32 mmol/L   Glucose, Bld 164 (H) 70 - 99 mg/dL    Comment: Glucose reference range applies only to samples taken after fasting for at least 8 hours.   BUN 8 6 - 20 mg/dL   Creatinine, Ser 1.00 0.61 - 1.24 mg/dL   Calcium 7.6 (L) 8.9 - 10.3 mg/dL   GFR, Estimated >60 >60 mL/min    Comment: (NOTE) Calculated using the CKD-EPI Creatinine Equation (2021)  Anion gap 5 5 - 15    Comment: Performed at [name], [place] [address], [place], [place] [address]  CBC     Status: Abnormal   Collection Time: 08/26/21  8:21 AM  Result Value Ref Range   WBC 14.7 (H) 4.0 - 10.5 K/uL   RBC 2.57 (L) 4.22 - 5.81 MIL/uL   Hemoglobin 8.3 (L) 13.0 - 17.0 g/dL   HCT 25.5 (L) 39.0 - 52.0 %   MCV 99.2 80.0 - 100.0 fL   MCH 32.3 26.0 - 34.0 pg   MCHC 32.5 30.0 - 36.0 g/dL   RDW 20.8 (H) 11.5 - 15.5 %   Platelets 80 (L) 150 - 400 K/uL    Comment: SPECIMEN CHECKED FOR CLOTS Immature Platelet Fraction may be clinically indicated, consider ordering this additional test [id number] CONSISTENT WITH PREVIOUS RESULT    nRBC 0.0 0.0 - 0.2 %    Comment: Performed at [facility], [place] [address], [place], [place] [address]  Glucose, capillary     Status: None   Collection Time: 08/26/21 12:10 PM  Result Value Ref Range   Glucose-Capillary 92 70 - 99 mg/dL    Comment: Glucose reference range applies only to samples taken after fasting for at least 8 hours.  ACTH stimulation, 3 time points     Status: None   Collection Time: 08/26/21  3:54 PM  Result Value Ref Range   Cortisol, Base 11.6 ug/dL    Comment: NO NORMAL RANGE ESTABLISHED FOR THIS TEST   Cortisol, 30 Min 15.6 ug/dL   Cortisol, 60  Min 17.9 ug/dL    Comment: Performed at [place] [address], [place], [place] [address]  Glucose, capillary     Status: Abnormal   Collection Time: 08/26/21  3:55 PM  Result Value Ref Range   Glucose-Capillary 102 (H) 70 - 99 mg/dL    Comment: Glucose reference range applies only to samples taken after fasting for at least 8 hours.  Glucose, capillary     Status: Abnormal   Collection Time: 08/26/21  9:16 PM  Result Value Ref Range   Glucose-Capillary 137 (H) 70 - 99 mg/dL    Comment: Glucose reference range applies only to samples taken after fasting for at least 8 hours.  Occult blood card to lab, stool     Status: Abnormal   Collection Time: 08/27/21  5:17 AM  Result Value Ref Range   Fecal Occult Bld POSITIVE (A) NEGATIVE    Comment: Performed at [facility], [place] [address], [place], [place] [address]  Glucose, capillary     Status: Abnormal   Collection Time: 08/27/21  8:08 AM  Result Value Ref Range   Glucose-Capillary 104 (H) 70 - 99 mg/dL    Comment: Glucose reference range applies only to samples taken after fasting for at least 8 hours.  Glucose, capillary     Status: Abnormal   Collection Time: 08/27/21 11:32 AM  Result Value Ref Range   Glucose-Capillary 104 (H) 70 - 99 mg/dL    Comment: Glucose reference range applies only to samples taken after fasting for at least 8 hours.   Comment 1 Notify RN    Comment 2 Document in Chart   Glucose, capillary     Status: Abnormal   Collection Time: 08/27/21  4:14 PM  Result Value Ref Range   Glucose-Capillary 100 (H) 70 - 99 mg/dL    Comment: Glucose reference range applies only  to samples taken after fasting for at least 8 hours.   Comment 1 Notify RN    Comment 2 Document in Chart   Glucose, capillary     Status: None   Collection Time: 08/27/21  9:44 PM  Result Value Ref Range   Glucose-Capillary 88 70 - 99 mg/dL    Comment: Glucose reference range applies only to samples taken after  fasting for at least 8 hours.  CBC with Differential/Platelet     Status: Abnormal   Collection Time: 08/27/21  9:52 PM  Result Value Ref Range   WBC 13.5 (H) 4.0 - 10.5 K/uL   RBC 2.51 (L) 4.22 - 5.81 MIL/uL   Hemoglobin 7.9 (L) 13.0 - 17.0 g/dL   HCT 24.8 (L) 39.0 - 52.0 %   MCV 98.8 80.0 - 100.0 fL   MCH 31.5 26.0 - 34.0 pg   MCHC 31.9 30.0 - 36.0 g/dL   RDW 19.9 (H) 11.5 - 15.5 %   Platelets 72 (L) 150 - 400 K/uL    Comment: Immature Platelet Fraction may be clinically indicated, consider ordering this additional test [id number] CONSISTENT WITH PREVIOUS RESULT    nRBC 0.0 0.0 - 0.2 %   Neutrophils Relative % 67 %   Neutro Abs 8.9 (H) 1.7 - 7.7 K/uL   Lymphocytes Relative 20 %   Lymphs Abs 2.7 0.7 - 4.0 K/uL   Monocytes Relative 11 %   Monocytes Absolute 1.5 (H) 0.1 - 1.0 K/uL   Eosinophils Relative 1 %   Eosinophils Absolute 0.2 0.0 - 0.5 K/uL   Basophils Relative 0 %   Basophils Absolute 0.1 0.0 - 0.1 K/uL   Immature Granulocytes 1 %   Abs Immature Granulocytes 0.17 (H) 0.00 - 0.07 K/uL    Comment: Performed at [facility], [place] [address] [place], [place] [address]  Basic metabolic panel     Status: Abnormal   Collection Time: 08/27/21  9:52 PM  Result Value Ref Range   Sodium 136 135 - 145 mmol/L   Potassium 3.1 (L) 3.5 - 5.1 mmol/L   Chloride 106 98 - 111 mmol/L   CO2 25 22 - 32 mmol/L   Glucose, Bld 164 (H) 70 - 99 mg/dL    Comment: Glucose reference range applies only to samples taken after fasting for at least 8 hours.   BUN 8 6 - 20 mg/dL   Creatinine, Ser 0.94 0.61 - 1.24 mg/dL   Calcium 7.4 (L) 8.9 - 10.3 mg/dL   GFR, Estimated >60 >60 mL/min    Comment: (NOTE) Calculated using the CKD-EPI Creatinine Equation (2021)    Anion gap 5 5 - 15    Comment: Performed at [facility], [place] [address] [place], [place] [address]  Magnesium     Status: Abnormal   Collection Time: 08/27/21  9:52 PM  Result Value Ref Range    Magnesium 1.2 (L) 1.7 - 2.4 mg/dL    Comment: Performed at [facility], [place] [address], [place], [place] [address]  Ammonia     Status: Abnormal   Collection Time: 08/27/21  9:52 PM  Result Value Ref Range   Ammonia 90 (H) 9 - 35 umol/L    Comment: Performed at [facility], [place] [address], [place],  [address]  Phosphorus     Status: Abnormal   Collection Time: 08/27/21  9:52 PM  Result Value Ref Range   Phosphorus 1.5 (L) 2.5 - 4.6 mg/dL  Comment: Performed at [facility], [place] [address], [place], [place] [address]  Basic metabolic panel     Status: Abnormal   Collection Time: 08/28/21  5:55 AM  Result Value Ref Range   Sodium 138 135 - 145 mmol/L   Potassium 3.1 (L) 3.5 - 5.1 mmol/L   Chloride 108 98 - 111 mmol/L   CO2 25 22 - 32 mmol/L   Glucose, Bld 104 (H) 70 - 99 mg/dL    Comment: Glucose reference range applies only to samples taken after fasting for at least 8 hours.   BUN 8 6 - 20 mg/dL   Creatinine, Ser 0.83 0.61 - 1.24 mg/dL   Calcium 7.3 (L) 8.9 - 10.3 mg/dL   GFR, Estimated >60 >60 mL/min    Comment: (NOTE) Calculated using the CKD-EPI Creatinine Equation (2021)    Anion gap 5 5 - 15    Comment: Performed at [facility], [place] [address], [place], [place] [address]  Phosphorus     Status: Abnormal   Collection Time: 08/28/21  5:55 AM  Result Value Ref Range   Phosphorus 2.3 (L) 2.5 - 4.6 mg/dL    Comment: Performed at [facility], [place] [address] [place],  [place] [address]  Magnesium     Status: Abnormal   Collection Time: 08/28/21  5:55 AM  Result Value Ref Range   Magnesium 1.4 (L) 1.7 - 2.4 mg/dL    Comment: Performed at [facility], [place] [address], [place],  [address]  CBC     Status: Abnormal   Collection Time: 08/28/21  5:55 AM  Result Value Ref Range   WBC 10.6 (H) 4.0 - 10.5 K/uL   RBC 2.28 (L) 4.22 - 5.81 MIL/uL    Hemoglobin 7.3 (L) 13.0 - 17.0 g/dL   HCT 22.7 (L) 39.0 - 52.0 %   MCV 99.6 80.0 - 100.0 fL   MCH 32.0 26.0 - 34.0 pg   MCHC 32.2 30.0 - 36.0 g/dL   RDW 20.4 (H) 11.5 - 15.5 %   Platelets 71 (L) 150 - 400 K/uL    Comment: SPECIMEN CHECKED FOR CLOTS Immature Platelet Fraction may be clinically indicated, consider ordering this additional test [id number] CONSISTENT WITH PREVIOUS RESULT REPEATED TO VERIFY    nRBC 0.0 0.0 - 0.2 %    Comment: Performed at [facility], [place] [address], [place],  [address]  Glucose, capillary     Status: None   Collection Time: 08/28/21  7:26 AM  Result Value Ref Range   Glucose-Capillary 97 70 - 99 mg/dL    Comment: Glucose reference range applies only to samples taken after fasting for at least 8 hours.   Comment 1 Notify RN    Comment 2 Document in Chart   Glucose, capillary     Status: Abnormal   Collection Time: 08/28/21 11:39 AM  Result Value Ref Range   Glucose-Capillary 112 (H) 70 - 99 mg/dL    Comment: Glucose reference range applies only to samples taken after fasting for at least 8 hours.   Comment 1 Notify RN    Comment 2 Document in Chart   Glucose, capillary     Status: Abnormal   Collection Time: 08/28/21  5:53 PM  Result Value Ref Range   Glucose-Capillary 101 (H) 70 - 99 mg/dL    Comment: Glucose reference range applies only to samples taken after fasting for at least 8 hours.  Glucose, capillary  Status: None   Collection Time: 08/28/21  9:56 PM  Result Value Ref Range   Glucose-Capillary 92 70 - 99 mg/dL    Comment: Glucose reference range applies only to samples taken after fasting for at least 8 hours.  CBC     Status: Abnormal   Collection Time: 08/29/21  3:43 AM  Result Value Ref Range   WBC 10.0 4.0 - 10.5 K/uL   RBC 2.30 (L) 4.22 - 5.81 MIL/uL   Hemoglobin 7.5 (L) 13.0 - 17.0 g/dL   HCT 22.8 (L) 39.0 - 52.0 %   MCV 99.1 80.0 - 100.0 fL   MCH 32.6 26.0 - 34.0 pg   MCHC 32.9 30.0 - 36.0 g/dL    RDW 19.5 (H) 11.5 - 15.5 %   Platelets 73 (L) 150 - 400 K/uL    Comment: SPECIMEN CHECKED FOR CLOTS Immature Platelet Fraction may be clinically indicated, consider ordering this additional test [id number] CONSISTENT WITH PREVIOUS RESULT REPEATED TO VERIFY    nRBC 0.0 0.0 - 0.2 %    Comment: Performed at [facility], [place] [address] [place], [place] [address]  Basic metabolic panel     Status: Abnormal   Collection Time: 08/29/21  3:43 AM  Result Value Ref Range   Sodium 138 135 - 145 mmol/L   Potassium 3.2 (L) 3.5 - 5.1 mmol/L   Chloride 107 98 - 111 mmol/L   CO2 27 22 - 32 mmol/L   Glucose, Bld 94 70 - 99 mg/dL    Comment: Glucose reference range applies only to samples taken after fasting for at least 8 hours.   BUN 9 6 - 20 mg/dL   Creatinine, Ser 0.83 0.61 - 1.24 mg/dL   Calcium 7.6 (L) 8.9 - 10.3 mg/dL   GFR, Estimated >60 >60 mL/min    Comment: (NOTE) Calculated using the CKD-EPI Creatinine Equation (2021)    Anion gap 4 (L) 5 - 15    Comment: Performed at [facility], [place] [address] [place], [place] [address]  Magnesium     Status: None   Collection Time: 08/29/21  3:43 AM  Result Value Ref Range   Magnesium 2.0 1.7 - 2.4 mg/dL    Comment: Performed at [facility], [place] [address] [place], [place] [address]  Phosphorus     Status: Abnormal   Collection Time: 08/29/21  3:43 AM  Result Value Ref Range   Phosphorus 2.4 (L) 2.5 - 4.6 mg/dL    Comment: Performed at [facility], [place] [address], [place], [place] [address]  Glucose, capillary     Status: None   Collection Time: 08/29/21  7:43 AM  Result Value Ref Range   Glucose-Capillary 80 70 - 99 mg/dL    Comment: Glucose reference range applies only to samples taken after fasting for at least 8 hours.  Glucose, capillary     Status: Abnormal   Collection Time: 08/29/21 12:02 PM  Result Value Ref Range   Glucose-Capillary 108 (H) 70 -  99 mg/dL    Comment: Glucose reference range applies only to samples taken after fasting for at least 8 hours.  Glucose, capillary     Status: None   Collection Time: 08/29/21  5:05 PM  Result Value Ref Range   Glucose-Capillary 92 70 - 99 mg/dL    Comment: Glucose reference range applies only to samples taken after fasting for at least 8 hours.  Glucose, capillary     Status:  None   Collection Time: 08/29/21  9:10 PM  Result Value Ref Range   Glucose-Capillary 83 70 - 99 mg/dL    Comment: Glucose reference range applies only to samples taken after fasting for at least 8 hours.  CBC with Differential/Platelet     Status: Abnormal   Collection Time: 08/30/21  3:00 AM  Result Value Ref Range   WBC 9.6 4.0 - 10.5 K/uL   RBC 2.02 (L) 4.22 - 5.81 MIL/uL   Hemoglobin 6.5 (LL) 13.0 - 17.0 g/dL    Comment: This critical result has verified and been called to [place]. by SEEL,MOLLY on 11 21 2022 at 0338, and has been read back.    HCT 20.4 (L) 39.0 - 52.0 %   MCV 101.0 (H) 80.0 - 100.0 fL   MCH 32.2 26.0 - 34.0 pg   MCHC 31.9 30.0 - 36.0 g/dL   RDW 19.5 (H) 11.5 - 15.5 %   Platelets 76 (L) 150 - 400 K/uL    Comment: Immature Platelet Fraction may be clinically indicated, consider ordering this additional test [id number]    nRBC 0.0 0.0 - 0.2 %   Neutrophils Relative % 79 %   Neutro Abs 7.6 1.7 - 7.7 K/uL   Lymphocytes Relative 15 %   Lymphs Abs 1.4 0.7 - 4.0 K/uL   Monocytes Relative 4 %   Monocytes Absolute 0.4 0.1 - 1.0 K/uL   Eosinophils Relative 1 %   Eosinophils Absolute 0.1 0.0 - 0.5 K/uL   Basophils Relative 1 %   Basophils Absolute 0.1 0.0 - 0.1 K/uL   WBC Morphology MORPHOLOGY UNREMARKABLE    RBC Morphology MORPHOLOGY UNREMARKABLE    Smear Review MORPHOLOGY UNREMARKABLE    Abs Immature Granulocytes 0.00 0.00 - 0.07 K/uL    Comment: Performed at [facility] [place] [address], [place], [place] [address]  Comprehensive metabolic panel     Status: Abnormal    Collection Time: 08/30/21  3:00 AM  Result Value Ref Range   Sodium 138 135 - 145 mmol/L   Potassium 3.3 (L) 3.5 - 5.1 mmol/L   Chloride 108 98 - 111 mmol/L   CO2 27 22 - 32 mmol/L   Glucose, Bld 97 70 - 99 mg/dL    Comment: Glucose reference range applies only to samples taken after fasting for at least 8 hours.   BUN 9 6 - 20 mg/dL   Creatinine, Ser 1.05 0.61 - 1.24 mg/dL   Calcium 7.4 (L) 8.9 - 10.3 mg/dL   Total Protein 5.7 (L) 6.5 - 8.1 g/dL   Albumin <1.5 (L) 3.5 - 5.0 g/dL   AST 34 15 - 41 U/L   ALT 27 0 - 44 U/L   Alkaline Phosphatase 203 (H) 38 - 126 U/L   Total Bilirubin 1.1 0.3 - 1.2 mg/dL   GFR, Estimated >60 >60 mL/min    Comment: (NOTE) Calculated using the CKD-EPI Creatinine Equation (2021)    Anion gap 3 (L) 5 - 15    Comment: Electrolytes repeated to confirm. Performed at [facility], [place] [address], [place], [name] [address]   Phosphorus     Status: None   Collection Time: 08/30/21  3:00 AM  Result Value Ref Range   Phosphorus 2.8 2.5 - 4.6 mg/dL    Comment: Performed at [facility], [place] [address] [place], [place] [address]  Magnesium     Status: None   Collection Time: 08/30/21  3:00 AM  Result  Value Ref Range   Magnesium 1.8 1.7 - 2.4 mg/dL    Comment: Performed at [facility], [place] [address] [place], [place] [address]  Type and screen [place]     Status: None   Collection Time: 08/30/21  5:03 AM  Result Value Ref Range   ABO/RH(D) O POS    Antibody Screen NEG    Sample Expiration 09/02/2021,2359    Unit Number [id number]    Blood Component Type RED CELLS,LR    Unit division 00    Status of Unit ISSUED,FINAL    Transfusion Status OK TO TRANSFUSE    Crossmatch Result      Compatible Performed at [facility], [place] [address] [place], [place] [address]   Prepare RBC (crossmatch)     Status: None   Collection Time: 08/30/21  5:03 AM   Result Value Ref Range   Order Confirmation      ORDER PROCESSED BY BLOOD BANK Performed at [place] [address] [place], [place] [address]   BPAM RBC     Status: None   Collection Time: 08/30/21  5:03 AM  Result Value Ref Range   ISSUE DATE / TIME [id number]    Blood Product Unit Number [id number]    PRODUCT CODE [id number]    Unit Type and Rh 5100    Blood Product Expiration Date 202212212359   Glucose, capillary     Status: None   Collection Time: 08/30/21  8:14 AM  Result Value Ref Range   Glucose-Capillary 92 70 - 99 mg/dL    Comment: Glucose reference range applies only to samples taken after fasting for at least 8 hours.  Glucose, capillary     Status: Abnormal   Collection Time: 08/30/21 11:31 AM  Result Value Ref Range   Glucose-Capillary 103 (H) 70 - 99 mg/dL    Comment: Glucose reference range applies only to samples taken after fasting for at least 8 hours.  Glucose, capillary     Status: Abnormal   Collection Time: 08/30/21  4:55 PM  Result Value Ref Range   Glucose-Capillary 132 (H) 70 - 99 mg/dL    Comment: Glucose reference range applies only to samples taken after fasting for at least 8 hours.  Glucose, capillary     Status: None   Collection Time: 08/30/21  9:08 PM  Result Value Ref Range   Glucose-Capillary 95 70 - 99 mg/dL    Comment: Glucose reference range applies only to samples taken after fasting for at least 8 hours.  Comprehensive metabolic panel     Status: Abnormal   Collection Time: 08/31/21  4:08 AM  Result Value Ref Range   Sodium 139 135 - 145 mmol/L   Potassium 3.4 (L) 3.5 - 5.1 mmol/L   Chloride 110 98 - 111 mmol/L   CO2 26 22 - 32 mmol/L   Glucose, Bld 105 (H) 70 - 99 mg/dL    Comment: Glucose reference range applies only to samples taken after fasting for at least 8 hours.   BUN 10 6 - 20 mg/dL   Creatinine, Ser 1.02 0.61 - 1.24 mg/dL   Calcium 7.7 (L) 8.9 - 10.3 mg/dL   Total Protein 6.3 (L) 6.5 - 8.1  g/dL   Albumin <1.5 (L) 3.5 - 5.0 g/dL   AST 31 15 - 41 U/L   ALT 26 0 - 44 U/L   Alkaline Phosphatase 205 (H) 38 - 126 U/L  Total Bilirubin 0.9 0.3 - 1.2 mg/dL   GFR, Estimated >60 >60 mL/min    Comment: (NOTE) Calculated using the CKD-EPI Creatinine Equation (2021)    Anion gap 3 (L) 5 - 15    Comment: Performed at [facility], [place] [address], [place], [place] [address]  CBC with Differential/Platelet     Status: Abnormal   Collection Time: 08/31/21  4:08 AM  Result Value Ref Range   WBC 10.4 4.0 - 10.5 K/uL   RBC 2.36 (L) 4.22 - 5.81 MIL/uL   Hemoglobin 7.6 (L) 13.0 - 17.0 g/dL   HCT 23.9 (L) 39.0 - 52.0 %   MCV 101.3 (H) 80.0 - 100.0 fL   MCH 32.2 26.0 - 34.0 pg   MCHC 31.8 30.0 - 36.0 g/dL   RDW 20.2 (H) 11.5 - 15.5 %   Platelets 84 (L) 150 - 400 K/uL    Comment: SPECIMEN CHECKED FOR CLOTS Immature Platelet Fraction may be clinically indicated, consider ordering this additional test [id number] CONSISTENT WITH PREVIOUS RESULT REPEATED TO VERIFY    nRBC 0.0 0.0 - 0.2 %   Neutrophils Relative % 62 %   Neutro Abs 6.6 1.7 - 7.7 K/uL   Lymphocytes Relative 27 %   Lymphs Abs 2.8 0.7 - 4.0 K/uL   Monocytes Relative 8 %   Monocytes Absolute 0.8 0.1 - 1.0 K/uL   Eosinophils Relative 1 %   Eosinophils Absolute 0.1 0.0 - 0.5 K/uL   Basophils Relative 1 %   Basophils Absolute 0.1 0.0 - 0.1 K/uL   Immature Granulocytes 1 %   Abs Immature Granulocytes 0.05 0.00 - 0.07 K/uL    Comment: Performed at [facility], [place] [address], [place], [place] [address]  Magnesium     Status: Abnormal   Collection Time: 08/31/21  4:08 AM  Result Value Ref Range   Magnesium 1.5 (L) 1.7 - 2.4 mg/dL    Comment: Performed at [facility], [place] [address] [place], [place] [address]  Phosphorus     Status: None   Collection Time: 08/31/21  4:08 AM  Result Value Ref Range   Phosphorus 2.6 2.5 - 4.6 mg/dL    Comment: Performed at [facility], [place] [address] [place], [place] [address]  Glucose, capillary     Status: None   Collection Time: 08/31/21  8:12 AM  Result Value Ref Range   Glucose-Capillary 97 70 - 99 mg/dL    Comment: Glucose reference range applies only to samples taken after fasting for at least 8 hours.  Glucose, capillary     Status: Abnormal   Collection Time: 08/31/21 11:44 AM  Result Value Ref Range   Glucose-Capillary 116 (H) 70 - 99 mg/dL    Comment: Glucose reference range applies only to samples taken after fasting for at least 8 hours.  Glucose, capillary     Status: Abnormal   Collection Time: 08/31/21  4:28 PM  Result Value Ref Range   Glucose-Capillary 114 (H) 70 - 99 mg/dL    Comment: Glucose reference range applies only to samples taken after fasting for at least 8 hours.  Blood Culture (routine x 2)     Status: None (Preliminary result)   Collection Time: 10/06/21  6:55 PM   Specimen: BLOOD  Result Value Ref Range   Specimen Description      BLOOD LEFT ANTECUBITAL Performed at [facility], [place] [address] [place], [place] [address]  Special Requests      BOTTLES DRAWN AEROBIC AND ANAEROBIC Blood Culture results may not be optimal due to an inadequate volume of blood received in culture bottles Performed at [facility], [place] [address], [place], [place] [address]    Culture      NO GROWTH 2 DAYS Performed at [facility], [place] [address] [address] [place], [place] [address]    Report Status PENDING   Blood Culture (routine x 2)     Status: None (Preliminary result)   Collection Time: 10/06/21  6:55 PM   Specimen: BLOOD  Result Value Ref Range   Specimen Description      BLOOD RIGHT ANTECUBITAL Performed at [facility] [address] [place], [place] [address]    Special Requests      BOTTLES DRAWN AEROBIC AND ANAEROBIC Blood Culture adequate volume Performed at [facility]  [address] [place], [place] [address]    Culture      NO GROWTH 2 DAYS Performed at [facility], [place] [address] [place], [place] [address]    Report Status PENDING   Lactic acid, plasma     Status: Abnormal   Collection Time: 10/06/21  6:58 PM  Result Value Ref Range   Lactic Acid, Venous 6.4 (HH) 0.5 - 1.9 mmol/L    Comment: CRITICAL RESULT CALLED TO, READ BACK BY AND VERIFIED WITH: Romelle Starcher, RN @ 2002 ON 10/06/2021 BY Ruffin Frederick, MLT Performed at [facility], [place] [address], [place], [place] [address]   Comprehensive metabolic panel     Status: Abnormal   Collection Time: 10/06/21  6:58 PM  Result Value Ref Range   Sodium 132 (L) 135 - 145 mmol/L   Potassium 2.7 (LL) 3.5 - 5.1 mmol/L    Comment: CRITICAL RESULT CALLED TO, READ BACK BY AND VERIFIED WITH: BETHANY, RN @ 2002 ON 10/06/2021 BY LBROOKS, MLT    Chloride 98 98 - 111 mmol/L   CO2 22 22 - 32 mmol/L   Glucose, Bld 94 70 - 99 mg/dL    Comment: Glucose reference range applies only to samples taken after fasting for at least 8 hours.   BUN <5 (L) 6 - 20 mg/dL   Creatinine, Ser 0.88 0.61 - 1.24 mg/dL   Calcium 7.9 (L) 8.9 - 10.3 mg/dL   Total Protein 6.6 6.5 - 8.1 g/dL   Albumin <1.5 (L) 3.5 - 5.0 g/dL   AST 62 (H) 15 - 41 U/L   ALT 71 (H) 0 - 44 U/L   Alkaline Phosphatase 314 (H) 38 - 126 U/L   Total Bilirubin 1.6 (H) 0.3 - 1.2 mg/dL   GFR, Estimated >60 >60 mL/min    Comment: (NOTE) Calculated using the CKD-EPI Creatinine Equation (2021)    Anion gap 12 5 - 15    Comment: Performed at [facility], [place] [address] [place], [place] [address]  CBC WITH DIFFERENTIAL     Status: Abnormal   Collection Time: 10/06/21  6:58 PM  Result Value Ref Range   WBC 11.5 (H) 4.0 - 10.5 K/uL   RBC 2.31 (L) 4.22 - 5.81 MIL/uL   Hemoglobin 7.4 (L) 13.0 - 17.0 g/dL   HCT 23.1 (L) 39.0 - 52.0 %   MCV 100.0 80.0 - 100.0 fL   MCH 32.0 26.0 - 34.0 pg   MCHC 32.0 30.0 - 36.0 g/dL   RDW 18.2 (H)  11.5 - 15.5 %  Platelets 17 (LL) 150 - 400 K/uL    Comment: SPECIMEN CHECKED FOR CLOTS Immature Platelet Fraction may be clinically indicated, consider ordering this additional test [id number] REPEATED TO VERIFY PLATELET COUNT CONFIRMED BY SMEAR    nRBC 0.0 0.0 - 0.2 %   Neutrophils Relative % 76 %   Neutro Abs 8.8 (H) 1.7 - 7.7 K/uL   Lymphocytes Relative 19 %   Lymphs Abs 2.1 0.7 - 4.0 K/uL   Monocytes Relative 3 %   Monocytes Absolute 0.4 0.1 - 1.0 K/uL   Eosinophils Relative 1 %   Eosinophils Absolute 0.1 0.0 - 0.5 K/uL   Basophils Relative 0 %   Basophils Absolute 0.0 0.0 - 0.1 K/uL   Immature Granulocytes 1 %   Abs Immature Granulocytes 0.08 (H) 0.00 - 0.07 K/uL    Comment: Performed at [facility], [place] [address], [place], [place] [address]  Protime-INR     Status: Abnormal   Collection Time: 10/06/21  6:58 PM  Result Value Ref Range   Prothrombin Time 19.4 (H) 11.4 - 15.2 seconds   INR 1.6 (H) 0.8 - 1.2    Comment: (NOTE) INR goal varies based on device and disease states. Performed at [facility], [place] [address], [place], [place] [address]   APTT     Status: Abnormal   Collection Time: 10/06/21  6:58 PM  Result Value Ref Range   aPTT 41 (H) 24 - 36 seconds    Comment:        IF BASELINE aPTT IS ELEVATED, SUGGEST PATIENT RISK ASSESSMENT BE USED TO DETERMINE APPROPRIATE ANTICOAGULANT THERAPY. Performed at [facility], [place] [address], [place], [place] [address]   Lactic acid, plasma     Status: Abnormal   Collection Time: 10/06/21  9:50 PM  Result Value Ref Range   Lactic Acid, Venous 4.3 (HH) 0.5 - 1.9 mmol/L    Comment: CRITICAL VALUE NOTED.  VALUE IS CONSISTENT WITH PREVIOUSLY REPORTED AND CALLED VALUE. Performed at [facility], [place] [address] [place], [place] [address]   POC occult blood, ED RN will collect     Status: Abnormal   Collection Time: 10/07/21 12:47 AM  Result  Value Ref Range   Fecal Occult Bld POSITIVE (A) NEGATIVE  Ethanol     Status: None   Collection Time: 10/07/21  1:02 AM  Result Value Ref Range   Alcohol, Ethyl (B) <10 <10 mg/dL    Comment: (NOTE) Lowest detectable limit for serum alcohol is 10 mg/dL.  For medical purposes only. Performed at [facility], [place] [address] [place], [place] [address]   Resp Panel by RT-PCR (Flu A&B, Covid) Nasopharyngeal Swab     Status: None   Collection Time: 10/07/21  1:02 AM   Specimen: Nasopharyngeal Swab; Nasopharyngeal(NP) swabs in vial transport medium  Result Value Ref Range   SARS Coronavirus 2 by RT PCR NEGATIVE NEGATIVE    Comment: (NOTE) SARS-CoV-2 target nucleic acids are NOT DETECTED.  The SARS-CoV-2 RNA is generally detectable in upper respiratory specimens during the acute phase of infection. The lowest concentration of SARS-CoV-2 viral copies this assay can detect is 138 copies/mL. A negative result does not preclude SARS-Cov-2 infection and should not be used as the sole basis for treatment or other patient management decisions. A negative result may occur with  improper specimen collection/handling, submission of specimen other than nasopharyngeal swab, presence of viral mutation(s) within the areas targeted by this  assay, and inadequate number of viral copies(<138 copies/mL). A negative result must be combined with clinical observations, patient history, and epidemiological information. The expected result is Negative.  Fact Sheet for Patients:  EntrepreneurPulse.com.au  Fact Sheet for Healthcare Providers:  IncredibleEmployment.be  This test is no t yet approved or cleared by the [place] FDA and  has been authorized for detection and/or diagnosis of SARS-CoV-2 by FDA under an Emergency Use Authorization (EUA). This EUA will remain  in effect (meaning this test can be used) for the duration of the COVID-19  declaration under Section 564(b)(1) of the Act, 21 U.S.C.section 360bbb-3(b)(1), unless the authorization is terminated  or revoked sooner.       Influenza A by PCR NEGATIVE NEGATIVE   Influenza B by PCR NEGATIVE NEGATIVE    Comment: (NOTE) The Xpert Xpress SARS-CoV-2/FLU/RSV plus assay is intended as an aid in the diagnosis of influenza from Nasopharyngeal swab specimens and should not be used as a sole basis for treatment. Nasal washings and aspirates are unacceptable for Xpert Xpress SARS-CoV-2/FLU/RSV testing.  Fact Sheet for Patients: EntrepreneurPulse.com.au  Fact Sheet for Healthcare Providers: IncredibleEmployment.be  This test is not yet approved or cleared by the [place] FDA and has been authorized for detection and/or diagnosis of SARS-CoV-2 by FDA under an Emergency Use Authorization (EUA). This EUA will remain in effect (meaning this test can be used) for the duration of the COVID-19 declaration under Section 564(b)(1) of the Act, 21 U.S.C. section 360bbb-3(b)(1), unless the authorization is terminated or revoked.  Performed at [facility], [place] [address] [place], [place] [address]   DIC Panel ONCE - STAT     Status: Abnormal   Collection Time: 10/07/21  1:02 AM  Result Value Ref Range   Prothrombin Time 20.0 (H) 11.4 - 15.2 seconds   INR 1.7 (H) 0.8 - 1.2    Comment: (NOTE) INR goal varies based on device and disease states.    aPTT 43 (H) 24 - 36 seconds    Comment:        IF BASELINE aPTT IS ELEVATED, SUGGEST PATIENT RISK ASSESSMENT BE USED TO DETERMINE APPROPRIATE ANTICOAGULANT THERAPY.    Fibrinogen 165 (L) 210 - 475 mg/dL    Comment: (NOTE) Fibrinogen results may be underestimated in patients receiving thrombolytic therapy.    D-Dimer, Quant 1.09 (H) 0.00 - 0.50 ug/mL-FEU    Comment: (NOTE) At the manufacturer cut-off value of 0.5 g/mL FEU, this assay has a negative predictive  value of 95-100%.This assay is intended for use in conjunction with a clinical pretest probability (PTP) assessment model to exclude pulmonary embolism (PE) and deep venous thrombosis (DVT) in outpatients suspected of PE or DVT. Results should be correlated with clinical presentation.    Platelets 21 (LL) 150 - 400 K/uL    Comment: Immature Platelet Fraction may be clinically indicated, consider ordering this additional test [id number] CRITICAL VALUE NOTED.  VALUE IS CONSISTENT WITH PREVIOUSLY REPORTED AND CALLED VALUE. REPEATED TO VERIFY    Smear Review NO SCHISTOCYTES SEEN     Comment: Performed at [facility] [address], [place],  [address]  Type and screen [place]     Status: None   Collection Time: 10/07/21  1:03 AM  Result Value Ref Range   ABO/RH(D) O POS    Antibody Screen NEG    Sample Expiration 10/10/2021,2359    Unit Number [id number]    Blood Component Type RED CELLS,LR  Unit division 00    Status of Unit ISSUED,FINAL    Transfusion Status OK TO TRANSFUSE    Crossmatch Result Compatible    Unit Number [id number]    Blood Component Type RBC LR PHER1    Unit division 00    Status of Unit ISSUED,FINAL    Transfusion Status OK TO TRANSFUSE    Crossmatch Result      Compatible Performed at [facility] [address] [place], [place] [address]   BPAM RBC     Status: None   Collection Time: 10/07/21  1:03 AM  Result Value Ref Range   ISSUE DATE / TIME 004599774142    Blood Product Unit Number [id number]    PRODUCT CODE [id number]    Unit Type and Rh 5100    Blood Product Expiration Date 902111552080    ISSUE DATE / TIME 223361224497    Blood Product Unit Number [id number]    PRODUCT CODE [id number]    Unit Type and Rh 5100    Blood Product Expiration Date 030131438887   Prepare platelet pheresis     Status: None   Collection Time: 10/07/21  1:20 AM  Result Value Ref Range    Unit Number [id number]    Blood Component Type PLT POOL LR    Unit division 00    Status of Unit ISSUED,FINAL    Transfusion Status      OK TO TRANSFUSE Performed at [facility] [address] [place], [name] [address]    Unit Number [id number]    Blood Component Type PLT POOL LR    Unit division 00    Status of Unit ISSUED,FINAL    Transfusion Status OK TO TRANSFUSE   BPAM Platelet Pheresis     Status: None   Collection Time: 10/07/21  1:20 AM  Result Value Ref Range   ISSUE DATE / TIME 747340370964    Blood Product Unit Number [id number]    PRODUCT CODE [id number]    Unit Type and Rh 6200    Blood Product Expiration Date 202212292359    ISSUE DATE / TIME 606770340352    Blood Product Unit Number [id number]    PRODUCT CODE EA047V00    Unit Type and Rh 5100    Blood Product Expiration Date 202212312359   Urinalysis, Routine w reflex microscopic Urine, Clean Catch     Status: Abnormal   Collection Time: 10/07/21  2:00 AM  Result Value Ref Range   Color, Urine YELLOW YELLOW   APPearance CLEAR CLEAR   Specific Gravity, Urine 1.012 1.005 - 1.030   pH 6.0 5.0 - 8.0   Glucose, UA NEGATIVE NEGATIVE mg/dL   Hgb urine dipstick LARGE (A) NEGATIVE   Bilirubin Urine NEGATIVE NEGATIVE   Ketones, ur NEGATIVE NEGATIVE mg/dL   Protein, ur NEGATIVE NEGATIVE mg/dL   Nitrite NEGATIVE NEGATIVE   Leukocytes,Ua LARGE (A) NEGATIVE   RBC / HPF 11-20 0 - 5 RBC/hpf   WBC, UA 21-50 0 - 5 WBC/hpf   Bacteria, UA RARE (A) NONE SEEN   Squamous Epithelial / LPF 0-5 0 - 5    Comment: Performed at [facility], [place] [address] [place], [place] [address]  Urine Culture     Status: Abnormal (Preliminary result)   Collection Time: 10/07/21  2:00 AM   Specimen: In/Out Cath Urine  Result Value Ref Range   Specimen Description      IN/OUT CATH URINE Performed at [facility], [address] [place], [place] [address]    Special  Requests      NONE Performed at [facility], [place] [address] [place], [place] [address]    Culture 20,000 COLONIES/mL ESCHERICHIA COLI (A)    Report Status PENDING   Procalcitonin - Baseline     Status: None   Collection Time: 10/07/21  2:24 AM  Result Value Ref Range   Procalcitonin 0.49 ng/mL    Comment:        Interpretation: PCT (Procalcitonin) <= 0.5 ng/mL: Systemic infection (sepsis) is not likely. Local bacterial infection is possible. (NOTE)       Sepsis PCT Algorithm           Lower Respiratory Tract                                      Infection PCT Algorithm    ----------------------------     ----------------------------         PCT < 0.25 ng/mL                PCT < 0.10 ng/mL          Strongly encourage             Strongly discourage   discontinuation of antibiotics    initiation of antibiotics    ----------------------------     -----------------------------       PCT 0.25 - 0.50 ng/mL            PCT 0.10 - 0.25 ng/mL               OR       >80% decrease in PCT            Discourage initiation of                                            antibiotics      Encourage discontinuation           of antibiotics    ----------------------------     -----------------------------         PCT >= 0.50 ng/mL              PCT 0.26 - 0.50 ng/mL               AND        <80% decrease in PCT             Encourage initiation of                                             antibiotics       Encourage continuation           of antibiotics    ----------------------------     -----------------------------        PCT >= 0.50 ng/mL                  PCT > 0.50 ng/mL               AND         increase in PCT  Strongly encourage                                      initiation of antibiotics    Strongly encourage escalation           of antibiotics                                     -----------------------------                                            PCT <= 0.25 ng/mL                                                 OR                                        > 80% decrease in PCT                                      Discontinue / Do not initiate                                             antibiotics  Performed at [facility] [address] [place], [place] [address]   Magnesium     Status: None   Collection Time: 10/07/21  2:24 AM  Result Value Ref Range   Magnesium 1.8 1.7 - 2.4 mg/dL    Comment: Performed at [facility], [place] [address], [place], [place] [address]  Cortisol     Status: None   Collection Time: 10/07/21  2:49 AM  Result Value Ref Range   Cortisol, Plasma 8.2 ug/dL    Comment: (NOTE) AM    6.7 - 22.6 ug/dL PM   <10.0       ug/dL Performed at [facility] [address] [place], [place] [address]   CBC     Status: Abnormal   Collection Time: 10/07/21  2:49 AM  Result Value Ref Range   WBC 11.4 (H) 4.0 - 10.5 K/uL   RBC 1.80 (L) 4.22 - 5.81 MIL/uL   Hemoglobin 5.8 (LL) 13.0 - 17.0 g/dL    Comment: REPEATED TO VERIFY THIS CRITICAL RESULT HAS VERIFIED AND BEEN CALLED TO J.NASH RN. BY TAMEECO CALDWELL ON 12 29 2022 AT 0402, AND HAS BEEN READ BACK.     HCT 17.7 (L) 39.0 - 52.0 %   MCV 98.3 80.0 - 100.0 fL   MCH 32.2 26.0 - 34.0 pg   MCHC 32.8 30.0 - 36.0 g/dL   RDW 18.2 (H) 11.5 - 15.5 %   Platelets 20 (LL) 150 - 400 K/uL    Comment: Immature Platelet Fraction may be clinically indicated, consider ordering this additional test [id number]  CRITICAL VALUE NOTED.  VALUE IS CONSISTENT WITH PREVIOUSLY REPORTED AND CALLED VALUE. REPEATED TO VERIFY THIS CRITICAL RESULT HAS VERIFIED AND BEEN CALLED TO [place] RN. BY TAMEECO CALDWELL ON 12 29 2022 AT 0402, AND HAS BEEN READ BACK.     nRBC 0.0 0.0 - 0.2 %    Comment: Performed at [facility], [place] [address] [place], [place] [address]  Magnesium     Status: None   Collection Time: 10/07/21  2:49  AM  Result Value Ref Range   Magnesium 1.7 1.7 - 2.4 mg/dL    Comment: Performed at [facility], [place] [address] [place], [place] [address]  Lactic acid, plasma     Status: Abnormal   Collection Time: 10/07/21  2:49 AM  Result Value Ref Range   Lactic Acid, Venous 2.4 (HH) 0.5 - 1.9 mmol/L    Comment: CRITICAL VALUE NOTED.  VALUE IS CONSISTENT WITH PREVIOUSLY REPORTED AND CALLED VALUE. Performed at [facility], [place] [address] [place], [place] [address]   Comprehensive metabolic panel     Status: Abnormal   Collection Time: 10/07/21  2:49 AM  Result Value Ref Range   Sodium 136 135 - 145 mmol/L   Potassium 3.1 (L) 3.5 - 5.1 mmol/L   Chloride 105 98 - 111 mmol/L   CO2 25 22 - 32 mmol/L   Glucose, Bld 88 70 - 99 mg/dL    Comment: Glucose reference range applies only to samples taken after fasting for at least 8 hours.   BUN <5 (L) 6 - 20 mg/dL   Creatinine, Ser 0.78 0.61 - 1.24 mg/dL   Calcium 7.0 (L) 8.9 - 10.3 mg/dL   Total Protein 5.1 (L) 6.5 - 8.1 g/dL   Albumin <1.5 (L) 3.5 - 5.0 g/dL   AST 48 (H) 15 - 41 U/L   ALT 53 (H) 0 - 44 U/L   Alkaline Phosphatase 251 (H) 38 - 126 U/L   Total Bilirubin 1.4 (H) 0.3 - 1.2 mg/dL   GFR, Estimated >60 >60 mL/min    Comment: (NOTE) Calculated using the CKD-EPI Creatinine Equation (2021)    Anion gap 6 5 - 15    Comment: Performed at [facility], [place] [address], [place], [place] [address]  Prepare RBC (crossmatch)     Status: None   Collection Time: 10/07/21  4:07 AM  Result Value Ref Range   Order Confirmation      ORDER PROCESSED BY BLOOD BANK Performed at [facility], [place] [address], [place], [place] [address]   MRSA Next Gen by PCR, Nasal     Status: Abnormal   Collection Time: 10/07/21  5:00 AM   Specimen: Nasal Mucosa; Nasal Swab  Result Value Ref Range   MRSA by PCR Next Gen DETECTED (A) NOT DETECTED    Comment: RESULT CALLED TO, READ BACK BY AND  VERIFIED WITH: ROBINSON, A.  RN ON 10/07/2021 @ 0916 BY MECIAL J. (NOTE) The GeneXpert MRSA Assay (FDA approved for NASAL specimens only), is one component of a comprehensive MRSA colonization surveillance program. It is not intended to diagnose MRSA infection nor to guide or monitor treatment for MRSA infections. Test performance is not FDA approved in patients less than 55 years old. Performed at [facility], [place] [address] [place], [place] [address]   Lactic acid, plasma     Status: Abnormal   Collection Time: 10/07/21  5:29 AM  Result Value Ref  Range   Lactic Acid, Venous 2.9 (HH) 0.5 - 1.9 mmol/L    Comment: CRITICAL VALUE NOTED.  VALUE IS CONSISTENT WITH PREVIOUSLY REPORTED AND CALLED VALUE. Performed at [facility], [place] [address], [place], [place] [address]   Lactate dehydrogenase     Status: None   Collection Time: 10/07/21  5:29 AM  Result Value Ref Range   LDH 105 98 - 192 U/L    Comment: Performed at [facility], [place] [address] [place], [place] [address]  Haptoglobin     Status: None   Collection Time: 10/07/21  5:29 AM  Result Value Ref Range   Haptoglobin 57 17 - 317 mg/dL    Comment: (NOTE) Performed At: [facility] [place], [place] [id number] Rush Farmer MD NI:[id number]   Cortisol, Random     Status: None   Collection Time: 10/07/21 12:48 PM  Result Value Ref Range   Cortisol, Plasma 10.0 ug/dL    Comment: (NOTE) AM    6.7 - 22.6 ug/dL PM   <10.0       ug/dL Performed at [place] [address] [place], [place] [address]   DIC Panel ONCE - STAT     Status: Abnormal   Collection Time: 10/07/21 12:48 PM  Result Value Ref Range   Prothrombin Time 19.1 (H) 11.4 - 15.2 seconds   INR 1.6 (H) 0.8 - 1.2    Comment: (NOTE) INR goal varies based on device and disease states.    aPTT 37 (H) 24 - 36 seconds    Comment:        IF BASELINE aPTT IS  ELEVATED, SUGGEST PATIENT RISK ASSESSMENT BE USED TO DETERMINE APPROPRIATE ANTICOAGULANT THERAPY.    Fibrinogen 224 210 - 475 mg/dL    Comment: (NOTE) Fibrinogen results may be underestimated in patients receiving thrombolytic therapy.    D-Dimer, Quant 1.14 (H) 0.00 - 0.50 ug/mL-FEU    Comment: (NOTE) At the manufacturer cut-off value of 0.5 g/mL FEU, this assay has a negative predictive value of 95-100%.This assay is intended for use in conjunction with a clinical pretest probability (PTP) assessment model to exclude pulmonary embolism (PE) and deep venous thrombosis (DVT) in outpatients suspected of PE or DVT. Results should be correlated with clinical presentation.    Platelets 60 (L) 150 - 400 K/uL    Comment: Immature Platelet Fraction may be clinically indicated, consider ordering this additional test [id number]    Smear Review NO SCHISTOCYTES SEEN     Comment: Performed at [facility], [place] [address] [place], [place] [address]  Lactic acid, plasma     Status: Abnormal   Collection Time: 10/07/21 12:48 PM  Result Value Ref Range   Lactic Acid, Venous 2.8 (HH) 0.5 - 1.9 mmol/L    Comment: CRITICAL VALUE NOTED.  VALUE IS CONSISTENT WITH PREVIOUSLY REPORTED AND CALLED VALUE. Performed at [facility], [place] [address] [place], [place] [address]   Protime-INR     Status: Abnormal   Collection Time: 10/07/21 12:48 PM  Result Value Ref Range   Prothrombin Time 18.5 (H) 11.4 - 15.2 seconds   INR 1.5 (H) 0.8 - 1.2    Comment: (NOTE) INR goal varies based on device and disease states. Performed at [name], [place] [address], [place], [place] [address]   CBC with Differential/Platelet     Status: Abnormal   Collection Time: 10/07/21  5:08 PM  Result Value  Ref Range   WBC 15.2 (H) 4.0 - 10.5 K/uL   RBC 2.34 (L) 4.22 - 5.81 MIL/uL   Hemoglobin 7.4 (L) 13.0 - 17.0 g/dL   HCT 22.7 (L) 39.0 - 52.0 %   MCV 97.0 80.0  - 100.0 fL   MCH 31.6 26.0 - 34.0 pg   MCHC 32.6 30.0 - 36.0 g/dL   RDW 16.9 (H) 11.5 - 15.5 %   Platelets 58 (L) 150 - 400 K/uL    Comment: SPECIMEN CHECKED FOR CLOTS REPEATED TO VERIFY PLATELET COUNT CONFIRMED BY SMEAR    nRBC 0.0 0.0 - 0.2 %   Neutrophils Relative % 73 %   Neutro Abs 11.1 (H) 1.7 - 7.7 K/uL   Lymphocytes Relative 20 %   Lymphs Abs 3.0 0.7 - 4.0 K/uL   Monocytes Relative 6 %   Monocytes Absolute 0.9 0.1 - 1.0 K/uL   Eosinophils Relative 0 %   Eosinophils Absolute 0.1 0.0 - 0.5 K/uL   Basophils Relative 0 %   Basophils Absolute 0.1 0.0 - 0.1 K/uL   Immature Granulocytes 1 %   Abs Immature Granulocytes 0.09 (H) 0.00 - 0.07 K/uL   Dohle Bodies PRESENT    Polychromasia PRESENT    Basophilic Stippling PRESENT     Comment: Performed at [facility], [place] [address] [place], [place] [address]  Procalcitonin     Status: None   Collection Time: 10/08/21  6:00 AM  Result Value Ref Range   Procalcitonin 0.55 ng/mL    Comment:        Interpretation: PCT > 0.5 ng/mL and <= 2 ng/mL: Systemic infection (sepsis) is possible, but other conditions are known to elevate PCT as well. (NOTE)       Sepsis PCT Algorithm           Lower Respiratory Tract                                      Infection PCT Algorithm    ----------------------------     ----------------------------         PCT < 0.25 ng/mL                PCT < 0.10 ng/mL          Strongly encourage             Strongly discourage   discontinuation of antibiotics    initiation of antibiotics    ----------------------------     -----------------------------       PCT 0.25 - 0.50 ng/mL            PCT 0.10 - 0.25 ng/mL               OR       >80% decrease in PCT            Discourage initiation of                                            antibiotics      Encourage discontinuation           of antibiotics    ----------------------------     -----------------------------         PCT >= 0.50  ng/mL  PCT 0.26 - 0.50 ng/mL                AND       <80% decrease in PCT             Encourage initiation of                                             antibiotics       Encourage continuation           of antibiotics    ----------------------------     -----------------------------        PCT >= 0.50 ng/mL                  PCT > 0.50 ng/mL               AND         increase in PCT                  Strongly encourage                                      initiation of antibiotics    Strongly encourage escalation           of antibiotics                                     -----------------------------                                           PCT <= 0.25 ng/mL                                                 OR                                        > 80% decrease in PCT                                      Discontinue / Do not initiate                                             antibiotics  Performed at [facility] [address] [place], [place] [address]   Comprehensive metabolic panel     Status: Abnormal   Collection Time: 10/08/21  6:00 AM  Result Value Ref Range   Sodium 135 135 - 145 mmol/L   Potassium 3.4 (L) 3.5 - 5.1 mmol/L   Chloride 105 98 - 111 mmol/L   CO2 25 22 - 32 mmol/L   Glucose, Bld 119 (H) 70 - 99 mg/dL  Comment: Glucose reference range applies only to samples taken after fasting for at least 8 hours.   BUN <5 (L) 6 - 20 mg/dL   Creatinine, Ser 0.86 0.61 - 1.24 mg/dL   Calcium 7.5 (L) 8.9 - 10.3 mg/dL   Total Protein 5.8 (L) 6.5 - 8.1 g/dL   Albumin <1.5 (L) 3.5 - 5.0 g/dL   AST 38 15 - 41 U/L   ALT 47 (H) 0 - 44 U/L   Alkaline Phosphatase 240 (H) 38 - 126 U/L   Total Bilirubin 2.0 (H) 0.3 - 1.2 mg/dL   GFR, Estimated >60 >60 mL/min    Comment: (NOTE) Calculated using the CKD-EPI Creatinine Equation (2021)    Anion gap 5 5 - 15    Comment: Performed at [facility], [place] [address],  [place], [place] [address]  Troponin I (High Sensitivity)     Status: Abnormal   Collection Time: 10/08/21  6:00 AM  Result Value Ref Range   Troponin I (High Sensitivity) 29 (H) <18 ng/L    Comment: (NOTE) Elevated high sensitivity troponin I (hsTnI) values and significant  changes across serial measurements may suggest ACS but many other  chronic and acute conditions are known to elevate hsTnI results.  Refer to the Links section for chest pain algorithms and additional  guidance. Performed at [facility], [place] [address] [place], [place] [address]   Magnesium     Status: None   Collection Time: 10/08/21  6:00 AM  Result Value Ref Range   Magnesium 1.8 1.7 - 2.4 mg/dL    Comment: Performed at [facility], [place] [address], [place], [place] [address]  CBC     Status: Abnormal   Collection Time: 10/08/21  6:30 AM  Result Value Ref Range   WBC 14.2 (H) 4.0 - 10.5 K/uL   RBC 2.31 (L) 4.22 - 5.81 MIL/uL   Hemoglobin 7.5 (L) 13.0 - 17.0 g/dL   HCT 22.5 (L) 39.0 - 52.0 %   MCV 97.4 80.0 - 100.0 fL   MCH 32.5 26.0 - 34.0 pg   MCHC 33.3 30.0 - 36.0 g/dL   RDW 18.1 (H) 11.5 - 15.5 %   Platelets 55 (L) 150 - 400 K/uL    Comment: SPECIMEN CHECKED FOR CLOTS Immature Platelet Fraction may be clinically indicated, consider ordering this additional test [id number] REPEATED TO VERIFY    nRBC 0.0 0.0 - 0.2 %    Comment: Performed at [facility], [place] [address] [place], [place] [address]     Allergies:  Allergies  Allergen Reactions   Dapsone Other (See Comments)    Per centricity "G6PD deficient"   Primaquine Phosphate Other (See Comments)    Per Centricity "G6PD deficient"    Current Medications:  Prior to Admission medications   Medication Sig Start Date End Date Taking? Authorizing Provider  albuterol (PROVENTIL HFA;VENTOLIN HFA) 108 (90 BASE) MCG/ACT inhaler Inhale 2 puffs into the lungs every 6 (six) hours as needed for  wheezing or shortness of breath.    [provider]  alprazolam Duanne Moron) 2 MG tablet Take 2 mg by mouth 3 (three) times daily.    [provider]  benzonatate (TESSALON) 100 MG capsule Take 200 mg by mouth every 8 (eight) hours as needed for cough. 10/23/19   [provider]  bictegravir-emtricitabine-tenofovir AF (BIKTARVY) 50-200-25 MG TABS tablet Take 1 tablet by mouth daily.  02/24/17   [provider]  cyclobenzaprine (  FLEXERIL) 10 MG tablet Take 10 mg by mouth 3 (three) times daily as needed for muscle spasms. 08/19/21   [provider]  diphenoxylate-atropine (LOMOTIL) 2.5-0.025 MG tablet Take 1 tablet by mouth every 8 (eight) hours as needed for diarrhea or loose stools. 02/16/21   [provider]  doxycycline (VIBRA-TABS) 100 MG tablet Take 1 tablet (100 mg total) by mouth every 12 (twelve) hours. 08/31/21 10/30/21  Raiford Noble Latif, DO  escitalopram (LEXAPRO) 20 MG tablet Take 1 tablet by mouth daily. 02/16/21   [provider]  famotidine (PEPCID) 40 MG tablet Take 40 mg by mouth every morning. 08/19/21   [provider]  folic acid (FOLVITE) 1 MG tablet Take 1 mg by mouth daily. 10/03/19   [provider]  food thickener (SIMPLYTHICK, NECTAR/LEVEL 2/MILDLY THICK,) GEL Take 1 packet by mouth as needed. 08/31/21   Raiford Noble Latif, DO  furosemide (LASIX) 40 MG tablet Take 40 mg by mouth daily as needed for fluid. 02/27/20   [provider]  haloperidol (HALDOL) 5 MG tablet Take 5 mg by mouth 2 (two) times daily. 08/19/21   [provider]  HYDROcodone-acetaminophen (NORCO/VICODIN) 5-325 MG tablet Take 1 tablet by mouth 2 (two) times daily as needed. 08/09/21   [provider]  ibuprofen (ADVIL) 800 MG tablet Take 800 mg by mouth 3 (three) times daily. 09/09/21   [provider]  lactulose (CHRONULAC) 10 GM/15ML solution Take 30 mLs (20 g total) by mouth 2 (two) times daily.  08/31/21   Raiford Noble Latif, DO  lurasidone 120 MG TABS Take 1 tablet (120 mg total) by mouth daily. 05/15/15   Niel Hummer, NP  magnesium oxide (MAG-OX) 400 (240 Mg) MG tablet Take 1 tablet (400 mg total) by mouth daily. 05/19/21   Geradine Girt, DO  meclizine (ANTIVERT) 25 MG tablet Take 25 mg by mouth every 8 (eight) hours as needed. 06/18/21   [provider]  midodrine (PROAMATINE) 10 MG tablet Take 1 tablet (10 mg total) by mouth 3 (three) times daily with meals. 08/31/21   Raiford Noble Latif, DO  Multiple Vitamin (MULTIVITAMIN WITH MINERALS) TABS tablet Take 1 tablet by mouth daily. 09/01/21   Sheikh, Omair Latif, DO  nicotine (NICODERM CQ - DOSED IN MG/24 HOURS) 14 mg/24hr patch Place 1 patch (14 mg total) onto the skin daily. 09/01/21   Raiford Noble Latif, DO  Nutritional Supplements (,FEEDING SUPPLEMENT, PROSOURCE PLUS) liquid Take 30 mLs by mouth 4 (four) times daily. 08/31/21   Raiford Noble Latif, DO  ondansetron (ZOFRAN) 8 MG tablet Take 8 mg by mouth every 8 (eight) hours as needed for nausea/vomiting, nausea or vomiting. 10/03/19   [provider]  pantoprazole (PROTONIX) 40 MG tablet Take 1 tablet (40 mg total) by mouth daily. 05/20/21   Geradine Girt, DO  thiamine 100 MG tablet Take 2.5 tablets (250 mg total) by mouth daily. 05/20/21   Geradine Girt, DO  thiamine 100 MG tablet Take 1 tablet (100 mg total) by mouth daily. 09/01/21   Raiford Noble Latif, DO  topiramate (TOPAMAX) 100 MG tablet Take 100 mg by mouth at bedtime. 06/18/21   [provider]  traZODone (DESYREL) 100 MG tablet Take 2 tablets (200 mg total) by mouth at bedtime as needed for sleep. 05/19/21   Geradine Girt, DO  traZODone (DESYREL) 50 MG tablet Take 200 mg by mouth at bedtime. 08/19/21   [provider]  valACYclovir (VALTREX) 1000 MG  tablet Take 1 tablet (1,000 mg total) by mouth daily. 05/15/15   Niel Hummer, NP    Social History:    reports that he has been  smoking cigarettes. He has been smoking an average of 1 pack per day. He has never used smokeless tobacco. He reports current alcohol use of about 14.0 standard drinks per week. He reports that he does not use drugs.   Family History:    Family History  Problem Relation Age of Onset   Alcohol abuse Mother    Schizophrenia Father    Depression Father    Alcohol abuse Father    Alcohol abuse Paternal Uncle    Alcohol abuse Paternal Uncle    Psychiatric Specialty Exam: Physical Exam Constitutional:      Appearance: He is underweight. He is ill-appearing.     Interventions: He is sedated.    Review of Systems  Constitutional:  Positive for fatigue.  Psychiatric/Behavioral:  Positive for dysphoric mood. Negative for suicidal ideas.    Blood pressure 100/65, pulse 80, temperature (!) 97.4 F (36.3 C), temperature source Oral, resp. rate 16, height 6' 3"  (1.905 m), weight 109.9 kg, SpO2 100 %.Body mass index is 30.28 kg/m.  General Appearance: Disheveled  Eye Contact:  Fair  Speech:  Slow and soft spoken  Volume:  Decreased  Mood:  Dysphoric and tired  Affect:  Flat  Thought Process:  Goal Directed  Orientation:  Full (Time, Place, and Person)  Thought Content:  Rumination and slow thought process. No delusions, paranoia  Suicidal Thoughts:  No  Homicidal Thoughts:  No  Memory:  Immediate;   Fair Recent;   Fair Remote;   Fair  Judgement:  Fair  Insight:  Present  Psychomotor Activity:  Decreased  Concentration:  Concentration: Fair and Attention Span: Fair  Recall:  [employer] of Knowledge:  Fair  Language:  Fair  Akathisia:  No  Handed:  Right  AIMS (if indicated):     Assets:  Communication Skills Desire for Improvement Housing  ADL's:  Intact  Cognition:  WNL  Sleep:   ok      Assessment/Plan: Review current medication.  Patient was last discharged on Xanax, Latuda and Lexapro.  Patient sleeping good and he feels tired and sometimes lethargic.  Discontinue  trazodone, Haldol due to prolonged QTc interval.  Patient is already on moderate dose of antipsychotic antianxiety and antidepressant medication.  Not clear if Topamax given for seizures if patient has no history of seizures when Topamax can also be discontinued.  Continue Xanax 2 mg 3 times a day, Latuda 120 mg daily and Lexapro 20 mg daily.  Patient does not meet criteria for inpatient services.  Continue supportive therapy.  Psychiatry will sign off unless patient symptoms relapse and need a new consult.  Thank you to provide opportunity to be involved in patient care.     Berniece Andreas MD Psychiatry

## 2021-10-08 NOTE — Evaluation (Signed)
Clinical/Bedside Swallow Evaluation Patient Details  Name: [name] MRN: [id number] Date of Birth: June 09, 1986  Today's Date: 10/08/2021 Time: SLP Start Time (ACUTE ONLY): 1400 SLP Stop Time (ACUTE ONLY): 1420 SLP Time Calculation (min) (ACUTE ONLY): 20 min  Past Medical History:  Past Medical History:  Diagnosis Date   Bipolar 1 disorder ([place])    Depression    Dizziness and giddiness 02/01/2016   Herpes genitalia    HIV disease ([place])    Hypertension    Migraine headache 02/01/2016   Peripheral neuropathy 10/01/2019   PTSD (post-traumatic stress disorder)    Schizoaffective disorder ([place])    Seizures ([place])    Past Surgical History:  Past Surgical History:  Procedure Laterality Date   BACK SURGERY     BIOPSY  02/26/2021   Procedure: BIOPSY;  Surgeon: Otis Brace, MD;  Location: [facility];  Service: Gastroenterology;;   COLONOSCOPY WITH PROPOFOL [name] 02/26/2021   Procedure: COLONOSCOPY WITH PROPOFOL;  Surgeon: Otis Brace, MD;  Location: [facility];  Service: Gastroenterology;  Laterality: [name];   ESOPHAGOGASTRODUODENOSCOPY (EGD) WITH PROPOFOL [name] 02/26/2021   Procedure: ESOPHAGOGASTRODUODENOSCOPY (EGD) WITH PROPOFOL;  Surgeon: Otis Brace, MD;  Location: [facility];  Service: Gastroenterology;  Laterality: [name];   ESOPHAGOGASTRODUODENOSCOPY (EGD) WITH PROPOFOL [name] 05/18/2021   Procedure: ESOPHAGOGASTRODUODENOSCOPY (EGD) WITH PROPOFOL;  Surgeon: Otis Brace, MD;  Location: [facility];  Service: Gastroenterology;  Laterality: [name];   HAND SURGERY     HPI:  35 y.o. male with hx of controlled HIV on medications, bipolar disorder, schizophrenia presented to the ED via EMS with complaints of sore on his buttocks. Dx sepsis. Hx of dysphagia and aspiration during prior admissions. Voice has been documented as weak/hoarse with low volume.  MBS 10/30/19 revealed pharyngeal dysphagia with intermittent aspiration of large boluses of thin liquids - dysphagia at  that time was attributed to effects of COVID on respiratory/swallowing sequence. Repeat MBS 05/08/21 showed similar deficits with delay of swallow onset after materials were sitting in the pharynx, leading to spillover into the airway. During both studies, modifying bolus size helped to prevent aspiration. His swallow was evaluated again at the bedside on 08/25/20, clinical impression was unchanged. When taking smaller sips of liquids there were no instances of coughing.  On morning of 12/30, pt was observed to likely aspirate applesauce, hence consult placed for SLP swallow eval.    Assessment / Plan / Recommendation  Clinical Impression  Pt presented today with obvious s/s of aspiration at bedside, leading to concern for airway obstruction and ultimate explosive coughing.  Intial swallows of ice chips, 1/2 teaspoon water appeared to be delayed with mild cough elicited. Subsequent boluses of 1/2 tspn pudding then ice chip culminated in event described above and exam was terminated. Pt was lethargic throughout evaluation, but participatory and asking for ice. His voice was generally aphonic with occasional break-through of weak phonation.    Recommend strict NPO; D/W NP Gerald Leitz and RN Josph Macho. NG would be best recommendation if pt will allow it - he has a hx of refusing tubes/catheters. Will likely need instrumental swallow study -FEES would be preference given pt's history of voice issues and questionable integrity of glottal closure. Again, endoscopy may not be possible if pt refuses.  In that case, we will need to repeat MBS. SLP will follow. SLP Visit Diagnosis: Dysphagia, pharyngeal phase (R13.13)    Aspiration Risk  Severe aspiration risk    Diet Recommendation   NPO  Medication Administration: Via alternative means  Other  Recommendations Oral Care Recommendations: Oral care QID    Recommendations for follow up therapy are one component of a multi-disciplinary discharge  planning process, led by the attending physician.  Recommendations may be updated based on patient status, additional functional criteria and insurance authorization.  Follow up Recommendations Other (comment) (tba)      Assistance Recommended at Discharge    Functional Status Assessment    Frequency and Duration            Prognosis        Swallow Study   General HPI: 35 y.o. male with hx of controlled HIV on medications, bipolar disorder, schizophrenia presented to the ED via EMS with complaints of sore on his buttocks. Dx sepsis. Hx of dysphagia and aspiration during prior admissions. Voice has been documented as weak/hoarse with low volume.  MBS 10/30/19 revealed pharyngeal dysphagia with intermittent aspiration of large boluses of thin liquids - dysphagia at that time was attributed to effects of COVID on respiratory/swallowing sequence. Repeat MBS 05/08/21 showed similar deficits with delay of swallow onset after materials were sitting in the pharynx, leading to spillover into the airway. During both studies, modifying bolus size helped to prevent aspiration. His swallow was evaluated again at the bedside on 08/25/20, clinical impression was unchanged. When taking smaller sips of liquids there were no instances of coughing.  On morning of 12/30, pt was observed to likely aspirate applesauce, hence consult placed for SLP swallow eval. Type of Study: Bedside Swallow Evaluation Previous Swallow Assessment: see HPI Diet Prior to this Study: NPO Temperature Spikes Noted: No Respiratory Status: Nasal cannula History of Recent Intubation: No Behavior/Cognition: Lethargic/Drowsy Oral Cavity Assessment: Within Functional Limits Oral Care Completed by SLP: No Self-Feeding Abilities: Total assist Patient Positioning: Upright in bed Baseline Vocal Quality: Low vocal intensity;Aphonic Volitional Cough: Weak Volitional Swallow: Able to elicit    Oral/Motor/Sensory Function Overall Oral  Motor/Sensory Function: Within functional limits   Ice Chips Ice chips: Impaired Presentation: Spoon Pharyngeal Phase Impairments: Cough - Immediate   Thin Liquid Thin Liquid: Impaired Presentation: Cup Pharyngeal  Phase Impairments: Cough - Immediate    Nectar Thick Nectar Thick Liquid: Not tested   Honey Thick Honey Thick Liquid: Not tested   Puree Puree: Impaired Presentation: Spoon Pharyngeal Phase Impairments: Cough - Delayed   Solid     Solid: Not tested     [name], [place] Office number [phone] Pager [phone]  [name] 10/08/2021,2:42 PM

## 2021-10-09 ENCOUNTER — Inpatient Hospital Stay (HOSPITAL_COMMUNITY): Payer: Medicare HMO

## 2021-10-09 DIAGNOSIS — E43 Unspecified severe protein-calorie malnutrition: Secondary | ICD-10-CM | POA: Diagnosis present

## 2021-10-09 DIAGNOSIS — E440 Moderate protein-calorie malnutrition: Secondary | ICD-10-CM | POA: Insufficient documentation

## 2021-10-09 DIAGNOSIS — A419 Sepsis, unspecified organism: Secondary | ICD-10-CM | POA: Diagnosis not present

## 2021-10-09 DIAGNOSIS — R6521 Severe sepsis with septic shock: Secondary | ICD-10-CM | POA: Diagnosis not present

## 2021-10-09 LAB — COMPREHENSIVE METABOLIC PANEL
ALT: 38 U/L (ref 0–44)
AST: 31 U/L (ref 15–41)
Albumin: <1.5 g/dL — ABNORMAL LOW (ref 3.5–5.0)
Alkaline Phosphatase: 219 U/L — ABNORMAL HIGH (ref 38–126)
Anion gap: 4 — ABNORMAL LOW (ref 5–15)
BUN: <5 mg/dL — ABNORMAL LOW (ref 6–20)
CO2: 26 mmol/L (ref 22–32)
Calcium: 7.6 mg/dL — ABNORMAL LOW (ref 8.9–10.3)
Chloride: 108 mmol/L (ref 98–111)
Creatinine, Ser: 0.85 mg/dL (ref 0.61–1.24)
GFR, Estimated: >60 mL/min (ref >60)
Glucose, Bld: 94 mg/dL (ref 70–99)
Potassium: 3.7 mmol/L (ref 3.5–5.1)
Sodium: 138 mmol/L (ref 135–145)
Total Bilirubin: 1.8 mg/dL — ABNORMAL HIGH (ref 0.3–1.2)
Total Protein: 5.6 g/dL — ABNORMAL LOW (ref 6.5–8.1)

## 2021-10-09 LAB — PHOSPHORUS
Phosphorus: 2.5 mg/dL (ref 2.5–4.6)
Phosphorus: 2.6 mg/dL (ref 2.5–4.6)

## 2021-10-09 LAB — CBC
HCT: 21.2 % — ABNORMAL LOW (ref 39.0–52.0)
Hemoglobin: 6.9 g/dL — CL (ref 13.0–17.0)
MCH: 32.1 pg (ref 26.0–34.0)
MCHC: 32.5 g/dL (ref 30.0–36.0)
MCV: 98.6 fL (ref 80.0–100.0)
Platelets: 43 10*3/uL — ABNORMAL LOW (ref 150–400)
RBC: 2.15 MIL/uL — ABNORMAL LOW (ref 4.22–5.81)
RDW: 17.5 % — ABNORMAL HIGH (ref 11.5–15.5)
WBC: 12.2 10*3/uL — ABNORMAL HIGH (ref 4.0–10.5)
nRBC: 0 % (ref 0.0–0.2)

## 2021-10-09 LAB — URINE CULTURE: Culture: 20000 — AB

## 2021-10-09 LAB — PREALBUMIN: Prealbumin: <5 mg/dL — ABNORMAL LOW (ref 18–38)

## 2021-10-09 LAB — MAGNESIUM
Magnesium: 1.7 mg/dL (ref 1.7–2.4)
Magnesium: 2.2 mg/dL (ref 1.7–2.4)

## 2021-10-09 LAB — PROCALCITONIN: Procalcitonin: 0.52 ng/mL

## 2021-10-09 LAB — PREPARE RBC (CROSSMATCH)

## 2021-10-09 MED ORDER — SODIUM CHLORIDE 0.9 % IV SOLN
2.0000 g | INTRAVENOUS | Status: AC
  Administered 2021-10-09 – 2021-10-15 (×7): 2 g via INTRAVENOUS
  Filled 2021-10-09 (×9): qty 20

## 2021-10-09 MED ORDER — MIDODRINE HCL 5 MG PO TABS
10.0000 mg | ORAL_TABLET | Freq: Three times a day (TID) | ORAL | Status: DC
Start: 2021-10-09 — End: 2021-10-12
  Administered 2021-10-09 – 2021-10-12 (×7): 10 mg
  Filled 2021-10-09 (×7): qty 2

## 2021-10-09 MED ORDER — ORAL CARE MOUTH RINSE
15.0000 mL | Freq: Two times a day (BID) | OROMUCOSAL | Status: DC
  Administered 2021-10-09 – 2021-11-03 (×34): 15 mL via OROMUCOSAL

## 2021-10-09 MED ORDER — ADULT MULTIVITAMIN LIQUID CH
15.0000 mL | Freq: Every day | ORAL | Status: DC
  Administered 2021-10-09 – 2021-10-13 (×5): 15 mL
  Filled 2021-10-09 (×5): qty 15

## 2021-10-09 MED ORDER — HYDROCORTISONE SOD SUC (PF) 100 MG IJ SOLR
50.0000 mg | Freq: Two times a day (BID) | INTRAMUSCULAR | Status: DC
  Administered 2021-10-09 – 2021-10-12 (×7): 50 mg via INTRAVENOUS
  Filled 2021-10-09 (×6): qty 2

## 2021-10-09 MED ORDER — MAGNESIUM SULFATE 4 GM/100ML IV SOLN
4.0000 g | Freq: Once | INTRAVENOUS | Status: AC
  Administered 2021-10-09: 4 g via INTRAVENOUS
  Filled 2021-10-09: qty 100

## 2021-10-09 MED ORDER — PIVOT 1.5 CAL PO LIQD
1000.0000 mL | ORAL | Status: DC
  Administered 2021-10-09 – 2021-10-12 (×4): 1000 mL
  Filled 2021-10-09 (×6): qty 1000

## 2021-10-09 MED ORDER — SODIUM CHLORIDE 0.9% IV SOLUTION: Freq: Once | INTRAVENOUS | Status: AC

## 2021-10-09 MED ORDER — ZINC SULFATE 220 (50 ZN) MG PO CAPS
220.0000 mg | ORAL_CAPSULE | Freq: Every day | ORAL | Status: DC
  Administered 2021-10-10 – 2021-10-12 (×3): 220 mg
  Filled 2021-10-09 (×4): qty 1

## 2021-10-09 MED ORDER — ACETAMINOPHEN 325 MG PO TABS
650.0000 mg | ORAL_TABLET | Freq: Four times a day (QID) | ORAL | Status: DC
  Administered 2021-10-09 – 2021-10-10 (×5): 650 mg
  Filled 2021-10-09 (×5): qty 2

## 2021-10-09 MED ORDER — LURASIDONE HCL 40 MG PO TABS
120.0000 mg | ORAL_TABLET | Freq: Every day | ORAL | Status: DC
  Administered 2021-10-10 – 2021-10-13 (×4): 120 mg
  Filled 2021-10-09 (×4): qty 3

## 2021-10-09 MED ORDER — POLYETHYLENE GLYCOL 3350 17 G PO PACK: 17.0000 g | PACK | Freq: Every day | ORAL | Status: DC | PRN

## 2021-10-09 MED ORDER — FUROSEMIDE 10 MG/ML IJ SOLN
20.0000 mg | Freq: Once | INTRAMUSCULAR | Status: AC
  Administered 2021-10-09: 20 mg via INTRAVENOUS
  Filled 2021-10-09: qty 2

## 2021-10-09 MED ORDER — DOCUSATE SODIUM 50 MG/5ML PO LIQD: 100.0000 mg | Freq: Two times a day (BID) | ORAL | Status: DC | PRN

## 2021-10-09 MED ORDER — FREE WATER
100.0000 mL | Status: DC
  Administered 2021-10-09 – 2021-10-13 (×19): 100 mL

## 2021-10-09 MED ORDER — ACETAMINOPHEN 325 MG PO TABS
650.0000 mg | ORAL_TABLET | Freq: Four times a day (QID) | ORAL | Status: DC
  Filled 2021-10-09: qty 2

## 2021-10-09 MED ORDER — ASCORBIC ACID 500 MG PO TABS
500.0000 mg | ORAL_TABLET | Freq: Two times a day (BID) | ORAL | Status: DC
Start: 2021-10-09 — End: 2021-10-13
  Administered 2021-10-09 – 2021-10-12 (×6): 500 mg
  Filled 2021-10-09 (×7): qty 1

## 2021-10-09 MED ORDER — CHLORHEXIDINE GLUCONATE 0.12 % MT SOLN
15.0000 mL | Freq: Two times a day (BID) | OROMUCOSAL | Status: DC
  Administered 2021-10-09 – 2021-11-04 (×35): 15 mL via OROMUCOSAL
  Filled 2021-10-09 (×42): qty 15

## 2021-10-09 MED ORDER — FOLIC ACID 1 MG PO TABS
1.0000 mg | ORAL_TABLET | Freq: Every day | ORAL | Status: DC
  Administered 2021-10-10 – 2021-10-12 (×3): 1 mg
  Filled 2021-10-09 (×4): qty 1

## 2021-10-09 MED ORDER — THIAMINE HCL 100 MG PO TABS
250.0000 mg | ORAL_TABLET | Freq: Every day | ORAL | Status: DC
  Administered 2021-10-12: 250 mg
  Filled 2021-10-09 (×2): qty 3

## 2021-10-09 MED ORDER — IPRATROPIUM-ALBUTEROL 0.5-2.5 (3) MG/3ML IN SOLN
3.0000 mL | Freq: Two times a day (BID) | RESPIRATORY_TRACT | Status: DC
  Administered 2021-10-10 – 2021-10-21 (×21): 3 mL via RESPIRATORY_TRACT
  Filled 2021-10-09 (×20): qty 3

## 2021-10-09 MED ORDER — KETOROLAC TROMETHAMINE 15 MG/ML IJ SOLN
15.0000 mg | Freq: Four times a day (QID) | INTRAMUSCULAR | Status: DC | PRN
  Administered 2021-10-09: 15 mg via INTRAVENOUS
  Filled 2021-10-09: qty 1

## 2021-10-09 MED ORDER — VALACYCLOVIR HCL 500 MG PO TABS
1000.0000 mg | ORAL_TABLET | Freq: Every day | ORAL | Status: DC
  Administered 2021-10-10 – 2021-10-13 (×4): 1000 mg
  Filled 2021-10-09 (×4): qty 2

## 2021-10-09 MED ORDER — ESCITALOPRAM OXALATE 10 MG PO TABS
20.0000 mg | ORAL_TABLET | Freq: Every day | ORAL | Status: DC
  Administered 2021-10-10 – 2021-10-13 (×4): 20 mg
  Filled 2021-10-09 (×4): qty 2

## 2021-10-09 MED ORDER — PANTOPRAZOLE 2 MG/ML SUSPENSION
40.0000 mg | Freq: Every day | ORAL | Status: DC
  Administered 2021-10-09 – 2021-10-12 (×4): 40 mg
  Filled 2021-10-09 (×4): qty 20

## 2021-10-09 NOTE — Progress Notes (Signed)
Nutrition Follow-up RD working remotely.  DOCUMENTATION CODES:   Non-severe (moderate) malnutrition in context of chronic illness  INTERVENTION:  - once small bore NGT placement confirmed: Pivot 1.5 @ 20 ml/hr to advance by 10 ml every 12 hours to reach goal rate of 60 ml/hr with 100 ml free water every 4 hours. - at goal rate, this regimen will provide 2160 kcal, 135 grams protein, and 1680 ml free water  Monitor magnesium, potassium, and phosphorus BID for at least 3 days, MD to replete as needed, as pt is at risk for refeeding syndrome given moderate malnutrition, inadequate oral intake for unknown amount of time.   NUTRITION DIAGNOSIS:   Moderate Malnutrition related to chronic illness (HIV) as evidenced by mild fat depletion, mild muscle depletion. -ongoing  GOAL:   Patient will meet greater than or equal to 90% of their needs -unmet at this time  MONITOR:   TF tolerance, Diet advancement, Labs, Weight trends, Skin  REASON FOR ASSESSMENT:   Consult Enteral/tube feeding initiation and management  ASSESSMENT:   35 y.o. male with medical history of HIV, HTN, PTSD, bipolar 1 disorder, depression, seizures, migraine headaches, and peripheral neuropathy. He presented to the ED via EMS due to soreness to buttocks.  Able to discuss with CCM MD, RN, and Pharmacist via secure chat. Patient noted to be a/o to person and place this AM.   Bedside swallow evaluation done by SLP yesterday afternoon and recommendation made for NPO status.   Patient is pending bedside placement of small bore NGT.  Weight remains stable. Mild pitting edema to all extremities documented in the edema section of flow sheet.    Labs reviewed; BUN: <5 mg/dl, Ca: 7.6 mg/dl, Alk Phos elevated.  Medications reviewed; 500 mg ascorbic acid BID, 20 mg IV pepcid BID, 2 mg folvite/day, 50 mg solu-cortef BID, 4 g IV Mg sulfate x1 run 12/31, 1 tablet multivitamin with minerals/day, 40 mg protonix/day, 40 mEq  Klor-Con x1 dose 12/30, 100 mg IV thiamine/day, 250 mg oral thiamine/day, 220 mg zinc sulfate/day.  IVF; LR @ 100 ml/hr.    Diet Order:   Diet Order             Diet NPO time specified  Diet effective now                   EDUCATION NEEDS:   Not appropriate for education at this time  Skin:  Skin Assessment: Reviewed RN Assessment ([facility] note from 12/29)  Last BM:  PTA/unknown  Height:   Ht Readings from Last 1 Encounters:  10/06/21 6' 3"  (1.905 m)    Weight:   Wt Readings from Last 1 Encounters:  10/09/21 109.5 kg     Estimated Nutritional Needs:  Kcal:  2180-2400 kcal Protein:  110-125 grams Fluid:  >/= 2.3 L/day      Jarome Matin, MS, RD, LDN, CNSC Inpatient Clinical Dietitian RD pager # available in [place]  After hours/weekend pager # available in [facility]

## 2021-10-09 NOTE — Progress Notes (Signed)
eLink Physician-Brief Progress Note Patient Name: [name] DOB: 1986-09-19 MRN: [id number]   Date of Service  10/09/2021  HPI/Events of Note  Patient is drowsy although bedside RN notes he rouses and follows commands, also oriented x 2, he has > 500 ml of urine in his bladder, and this is likely contributing to intermittent delirium.  eICU Interventions  Bedside RN requested to in / out bladder catheterize patient to relieve urinary retention and hopefully resolve intermittent delirium.         [name] 10/09/2021, 2:39 AM

## 2021-10-09 NOTE — Progress Notes (Signed)
eLink Physician-Brief Progress Note Patient Name: [name] DOB: [birth year] MRN: [id number]   Date of Service  10/09/2021  HPI/Events of Note  Patient needs an order for analgesics for pain related to wounds but he is too drowsy to safely administer narcotics, creatinine is 0.86.  eICU Interventions  Toradol 15 mg iv Q 6 hours prn pain x 4 doses ordered.        [name] 10/09/2021, 5:31 AM

## 2021-10-09 NOTE — Progress Notes (Signed)
[facility]   Reason for visit: Follow up on shock  Interval History: some decrease in pressor support but remains on levophed; feels better.  Remains afebrile and WBC mildly elevated.   Day 4 total antibiotics  Physical Exam: Constitutional:  Vitals:   10/09/21 1500 10/09/21 1600  BP: 103/69 104/72  Pulse: 76 77  Resp: 12 13  Temp:    SpO2: 100% 100%   patient appears in NAD Respiratory: Normal respiratory effort; CTA B Cardiovascular: RRR MS: + edema  Review of Systems: Constitutional: negative for fevers and chills Gastrointestinal: negative for nausea and diarrhea  Lab Results  Component Value Date   WBC 12.2 (H) 10/09/2021   HGB 6.9 (LL) 10/09/2021   HCT 21.2 (L) 10/09/2021   MCV 98.6 10/09/2021   PLT 43 (L) 10/09/2021    Lab Results  Component Value Date   CREATININE 0.85 10/09/2021   BUN <5 (L) 10/09/2021   NA 138 10/09/2021   K 3.7 10/09/2021   CL 108 10/09/2021   CO2 26 10/09/2021    Lab Results  Component Value Date   ALT 38 10/09/2021   AST 31 10/09/2021   ALKPHOS 219 (H) 10/09/2021     Microbiology: Recent Results (from the past 240 hour(s))  Blood Culture (routine x 2)     Status: None (Preliminary result)   Collection Time: 10/06/21  6:55 PM   Specimen: BLOOD  Result Value Ref Range Status   Specimen Description   Final    BLOOD LEFT ANTECUBITAL Performed at [facility], [place] [address], [place], [place] [address]    Special Requests   Final    BOTTLES DRAWN AEROBIC AND ANAEROBIC Blood Culture results may not be optimal due to an inadequate volume of blood received in culture bottles Performed at [facility] [address], [place], [place] [address]    Culture   Final    NO GROWTH 3 DAYS Performed at [facility], [place] [address], [place], [place] [address]    Report Status PENDING  Incomplete  Blood Culture (routine x 2)     Status: None (Preliminary result)    Collection Time: 10/06/21  6:55 PM   Specimen: BLOOD  Result Value Ref Range Status   Specimen Description   Final    BLOOD RIGHT ANTECUBITAL Performed at [facility] [address] [place], [place] [address]    Special Requests   Final    BOTTLES DRAWN AEROBIC AND ANAEROBIC Blood Culture adequate volume Performed at [facility] [address] [place], [place] [address]    Culture   Final    NO GROWTH 3 DAYS Performed at [facility], [place] [address] [place], [place] [address]    Report Status PENDING  Incomplete  Resp Panel by RT-PCR (Flu A&B, Covid) Nasopharyngeal Swab     Status: None   Collection Time: 10/07/21  1:02 AM   Specimen: Nasopharyngeal Swab; Nasopharyngeal(NP) swabs in vial transport medium  Result Value Ref Range Status   SARS Coronavirus 2 by RT PCR NEGATIVE NEGATIVE Final    Comment: (NOTE) SARS-CoV-2 target nucleic acids are NOT DETECTED.  The SARS-CoV-2 RNA is generally detectable in upper respiratory specimens during the acute phase of infection. The lowest concentration of SARS-CoV-2 viral copies this assay can detect is 138 copies/mL. A negative result does not preclude SARS-Cov-2 infection and should not be used as the sole basis  for treatment or other patient management decisions. A negative result may occur with  improper specimen collection/handling, submission of specimen other than nasopharyngeal swab, presence of viral mutation(s) within the areas targeted by this assay, and inadequate number of viral copies(<138 copies/mL). A negative result must be combined with clinical observations, patient history, and epidemiological information. The expected result is Negative.  Fact Sheet for Patients:  EntrepreneurPulse.com.au  Fact Sheet for Healthcare Providers:  IncredibleEmployment.be  This test is no t yet approved or cleared by the [place] FDA and  has  been authorized for detection and/or diagnosis of SARS-CoV-2 by FDA under an Emergency Use Authorization (EUA). This EUA will remain  in effect (meaning this test can be used) for the duration of the COVID-19 declaration under Section 564(b)(1) of the Act, 21 U.S.C.section 360bbb-3(b)(1), unless the authorization is terminated  or revoked sooner.       Influenza A by PCR NEGATIVE NEGATIVE Final   Influenza B by PCR NEGATIVE NEGATIVE Final    Comment: (NOTE) The Xpert Xpress SARS-CoV-2/FLU/RSV plus assay is intended as an aid in the diagnosis of influenza from Nasopharyngeal swab specimens and should not be used as a sole basis for treatment. Nasal washings and aspirates are unacceptable for Xpert Xpress SARS-CoV-2/FLU/RSV testing.  Fact Sheet for Patients: EntrepreneurPulse.com.au  Fact Sheet for Healthcare Providers: IncredibleEmployment.be  This test is not yet approved or cleared by the [place] FDA and has been authorized for detection and/or diagnosis of SARS-CoV-2 by FDA under an Emergency Use Authorization (EUA). This EUA will remain in effect (meaning this test can be used) for the duration of the COVID-19 declaration under Section 564(b)(1) of the Act, 21 U.S.C. section 360bbb-3(b)(1), unless the authorization is terminated or revoked.  Performed at [facility], [place] [address] [place], [place] [address]   Urine Culture     Status: Abnormal   Collection Time: 10/07/21  2:00 AM   Specimen: In/Out Cath Urine  Result Value Ref Range Status   Specimen Description   Final    IN/OUT CATH URINE Performed at [facility] [address] [place], [place] [address]    Special Requests   Final    NONE Performed at [facility], [place] [address], [place], [place] [address]    Culture 20,000 COLONIES/mL ESCHERICHIA COLI (A)  Final   Report Status 10/09/2021 FINAL  Final    Organism ID, Bacteria ESCHERICHIA COLI (A)  Final      Susceptibility   Escherichia coli - MIC*    AMPICILLIN <=2 SENSITIVE Sensitive     CEFAZOLIN <=4 SENSITIVE Sensitive     CEFEPIME <=0.12 SENSITIVE Sensitive     CEFTRIAXONE <=0.25 SENSITIVE Sensitive     CIPROFLOXACIN <=0.25 SENSITIVE Sensitive     GENTAMICIN <=1 SENSITIVE Sensitive     IMIPENEM <=0.25 SENSITIVE Sensitive     NITROFURANTOIN <=16 SENSITIVE Sensitive     TRIMETH/SULFA <=20 SENSITIVE Sensitive     AMPICILLIN/SULBACTAM <=2 SENSITIVE Sensitive     PIP/TAZO <=4 SENSITIVE Sensitive     * 20,000 COLONIES/mL ESCHERICHIA COLI  MRSA Next Gen by PCR, Nasal     Status: Abnormal   Collection Time: 10/07/21  5:00 AM   Specimen: Nasal Mucosa; Nasal Swab  Result Value Ref Range Status   MRSA by PCR Next Gen DETECTED (A) NOT DETECTED Final    Comment: RESULT CALLED TO, READ BACK BY AND VERIFIED WITH: ROBINSON, A.  RN ON 10/07/2021 @ 0916 BY  MECIAL J. (NOTE) The GeneXpert MRSA Assay (FDA approved for NASAL specimens only), is one component of a comprehensive MRSA colonization surveillance program. It is not intended to diagnose MRSA infection nor to guide or monitor treatment for MRSA infections. Test performance is not FDA approved in patients less than 4 years old. Performed at [facility], [place] [address], [place], [place] [address]     Impression/Plan:  1. Shock - may be septic with urinary infection based on CT findings and growth with E coli.  Though this has been a recurrent shock so also may be related to nutrition. Starting tube feeds per CCM  Will continue with antibiotics and will use ceftriaxone to target E coli.   2.  Malnutrition - as above, his anemia,shock, other issues certainly partly due to his nutrition.  Thiamine given.  Starting tube feeds.    3.  Edema - also from malnutrition and getting lasix.

## 2021-10-09 NOTE — Progress Notes (Signed)
eLink Physician-Brief Progress Note Patient Name: [name] DOB: [birth year] MRN: [id number]   Date of Service  10/09/2021  HPI/Events of Note  Pt ith chronic back pain, takes vicon and flexeril at home. Not ordered here yet.  eICU Interventions  Cirrhosis on [place]. Sepsis. On abx. On nasal o2. Drowzy/Bipolar.   Would avoid both meds for tonight if possible. Asp precautions      Intervention Category Minor Interventions: Other:  Elmer Sow 10/09/2021, 8:36 PM

## 2021-10-09 NOTE — Consult Note (Signed)
Urology Consult  Consulting MD: Chase Caller  CC: Difficult catheter placement HPI: This is a 35year old male without significant prior urologic history admitted for sepsis.  This may well be of urologic source as he does have urinary tract infection and gas in his bladder/collecting system/bladder wall.  The patient is on the critical care service.  He has been incontinent of urine.  Because of his urologic findings on CT scan, catheter placement was attempted x2 earlier.  This was unsuccessful, urologic consultation is requested.  PMH: Past Medical History:  Diagnosis Date   Bipolar 1 disorder ([place])    Depression    Dizziness and giddiness 02/01/2016   Herpes genitalia    HIV disease ([place])    Hypertension    Migraine headache 02/01/2016   Peripheral neuropathy 10/01/2019   PTSD (post-traumatic stress disorder)    Schizoaffective disorder ([place])    Seizures ([place])     PSH: Past Surgical History:  Procedure Laterality Date   BACK SURGERY     BIOPSY  02/26/2021   Procedure: BIOPSY;  Surgeon: Otis Brace, MD;  Location: [facility];  Service: Gastroenterology;;   COLONOSCOPY WITH PROPOFOL [name] 02/26/2021   Procedure: COLONOSCOPY WITH PROPOFOL;  Surgeon: Otis Brace, MD;  Location: [facility];  Service: Gastroenterology;  Laterality: [name];   ESOPHAGOGASTRODUODENOSCOPY (EGD) WITH PROPOFOL [name] 02/26/2021   Procedure: ESOPHAGOGASTRODUODENOSCOPY (EGD) WITH PROPOFOL;  Surgeon: Otis Brace, MD;  Location: [facility];  Service: Gastroenterology;  Laterality: [name];   ESOPHAGOGASTRODUODENOSCOPY (EGD) WITH PROPOFOL [name] 05/18/2021   Procedure: ESOPHAGOGASTRODUODENOSCOPY (EGD) WITH PROPOFOL;  Surgeon: Otis Brace, MD;  Location: [facility];  Service: Gastroenterology;  Laterality: [name];   HAND SURGERY      Allergies: Allergies  Allergen Reactions   Dapsone Other (See Comments)    Per centricity "G6PD deficient"   Primaquine Phosphate Other (See Comments)    Per Centricity  "G6PD deficient"    Medications: Medications Prior to Admission  Medication Sig Dispense Refill Last Dose   albuterol (PROVENTIL HFA;VENTOLIN HFA) 108 (90 BASE) MCG/ACT inhaler Inhale 2 puffs into the lungs every 6 (six) hours as needed for wheezing or shortness of breath.      alprazolam (XANAX) 2 MG tablet Take 2 mg by mouth 3 (three) times daily.      benzonatate (TESSALON) 100 MG capsule Take 200 mg by mouth every 8 (eight) hours as needed for cough.      bictegravir-emtricitabine-tenofovir AF (BIKTARVY) 50-200-25 MG TABS tablet Take 1 tablet by mouth daily.       cyclobenzaprine (FLEXERIL) 10 MG tablet Take 10 mg by mouth 3 (three) times daily as needed for muscle spasms.      diphenoxylate-atropine (LOMOTIL) 2.5-0.025 MG tablet Take 1 tablet by mouth every 8 (eight) hours as needed for diarrhea or loose stools.      doxycycline (VIBRA-TABS) 100 MG tablet Take 1 tablet (100 mg total) by mouth every 12 (twelve) hours. 60 tablet 1    escitalopram (LEXAPRO) 20 MG tablet Take 1 tablet by mouth daily.      famotidine (PEPCID) 40 MG tablet Take 40 mg by mouth every morning.      folic acid (FOLVITE) 1 MG tablet Take 1 mg by mouth daily.      food thickener (SIMPLYTHICK, NECTAR/LEVEL 2/MILDLY THICK,) GEL Take 1 packet by mouth as needed. 10 packet 0    furosemide (LASIX) 40 MG tablet Take 40 mg by mouth daily as needed for fluid.      haloperidol (HALDOL) 5 MG  tablet Take 5 mg by mouth 2 (two) times daily.      HYDROcodone-acetaminophen (NORCO/VICODIN) 5-325 MG tablet Take 1 tablet by mouth 2 (two) times daily as needed.      ibuprofen (ADVIL) 800 MG tablet Take 800 mg by mouth 3 (three) times daily.      lactulose (CHRONULAC) 10 GM/15ML solution Take 30 mLs (20 g total) by mouth 2 (two) times daily. 236 mL 0    lurasidone 120 MG TABS Take 1 tablet (120 mg total) by mouth daily. 30 tablet 0    magnesium oxide (MAG-OX) 400 (240 Mg) MG tablet Take 1 tablet (400 mg total) by mouth daily. 30 tablet  0    meclizine (ANTIVERT) 25 MG tablet Take 25 mg by mouth every 8 (eight) hours as needed.      midodrine (PROAMATINE) 10 MG tablet Take 1 tablet (10 mg total) by mouth 3 (three) times daily with meals. 90 tablet 0    Multiple Vitamin (MULTIVITAMIN WITH MINERALS) TABS tablet Take 1 tablet by mouth daily. 30 tablet 0    nicotine (NICODERM CQ - DOSED IN MG/24 HOURS) 14 mg/24hr patch Place 1 patch (14 mg total) onto the skin daily. 28 patch 0    Nutritional Supplements (,FEEDING SUPPLEMENT, PROSOURCE PLUS) liquid Take 30 mLs by mouth 4 (four) times daily. 887 mL 0    ondansetron (ZOFRAN) 8 MG tablet Take 8 mg by mouth every 8 (eight) hours as needed for nausea/vomiting, nausea or vomiting.      pantoprazole (PROTONIX) 40 MG tablet Take 1 tablet (40 mg total) by mouth daily. 30 tablet 0    thiamine 100 MG tablet Take 2.5 tablets (250 mg total) by mouth daily. 30 tablet 0    thiamine 100 MG tablet Take 1 tablet (100 mg total) by mouth daily. 30 tablet 0    topiramate (TOPAMAX) 100 MG tablet Take 100 mg by mouth at bedtime.      traZODone (DESYREL) 100 MG tablet Take 2 tablets (200 mg total) by mouth at bedtime as needed for sleep. 30 tablet 0    traZODone (DESYREL) 50 MG tablet Take 200 mg by mouth at bedtime.      valACYclovir (VALTREX) 1000 MG tablet Take 1 tablet (1,000 mg total) by mouth daily.        Social History: Social History   Socioeconomic History   Marital status: Single    Spouse name: Not on file   Number of children: Not on file   Years of education: Not on file   Highest education level: Not on file  Occupational History   Occupation: [employer]  Tobacco Use   Smoking status: Every Day    Packs/day: 1.00    Types: Cigarettes   Smokeless tobacco: Never  Vaping Use   Vaping Use: Never used  Substance and Sexual Activity   Alcohol use: Yes    Alcohol/week: 14.0 standard drinks    Types: 14 Cans of beer per week    Comment: 5-6 40oz per day   Drug use: No   Sexual  activity: Not Currently    Birth control/protection: Condom  Other Topics Concern   Not on file  Social History Narrative   Lives at home alone   Right-handed   Drinks 2-3 sodas per day   Social Determinants of Health   Financial Resource Strain: Not on file  Food Insecurity: Not on file  Transportation Needs: Not on file  Physical Activity: Not on file  Stress:  Not on file  Social Connections: Not on file  Intimate Partner Violence: Not on file    Family History: Family History  Problem Relation Age of Onset   Alcohol abuse Mother    Schizophrenia Father    Depression Father    Alcohol abuse Father    Alcohol abuse Paternal Uncle    Alcohol abuse Paternal Uncle     Review of Systems: Unable to obtain due to patient's mental status  Physical Exam: @VITALS2 @ General: No acute distress.  Somnolent. Head:  Normocephalic.  Atraumatic. ENT:  EOMI.  Mucous membranes moist Neck:  Supple.  No lymphadenopathy. CV:  Regular rate. Pulmonary: Equal effort bilaterally.   Skin:  Normal turgor.  No visible rash. Extremity: No gross deformity of extremities.  Neurologic: Alert. Appropriate mood. GU:                  Phallus uncircumcised.  Scrotal skin/penile skin normal.  Glans/meatus normal.  Studies:  Recent Labs    10/08/21 0630 10/09/21 0602  HGB 7.5* 6.9*  WBC 14.2* 12.2*  PLT 55* 43*    Recent Labs    10/08/21 0600 10/09/21 0602  NA 135 138  K 3.4* 3.7  CL 105 108  CO2 25 26  BUN <5* <5*  CREATININE 0.86 0.85  CALCIUM 7.5* 7.6*  GFRNONAA >60 >60     Recent Labs    10/07/21 0102 10/07/21 1248  INR 1.7* 1.6*   1.5*  APTT 43* 37*     Invalid input(s): ABG  I reviewed patient's lab and CT results/images  Procedure note: After sterile prep and drape and using 20 mL of 2% lidocaine jelly, 18 French coud tip catheter easily passed.  Slightly bloody urine obtained.  This was hooked to dependent drainage after balloon filled with 10 cc of  water.  Assessment: Emphysematous cystitis  Difficult catheterization  Plan: 1.  Leave catheter in until patient adequately awake/ambulatory/able to have voiding trial  2.  Appropriate antibiotic management for his urinary tract infection.  I do not think any different antibiotic management needed for his emphysematous findings  3.  Reconsult urology if needed    Pager:[phone]

## 2021-10-09 NOTE — Progress Notes (Signed)
2 unsuccessful attempts made to place foley. Pt had an unmeasured urine occurrence. Bladder scan showed 453m after urine occurrence.

## 2021-10-09 NOTE — Plan of Care (Signed)
°  Problem: Clinical Measurements: Goal: Ability to maintain clinical measurements within normal limits will improve Outcome: Progressing   Problem: Fluid Volume: Goal: Hemodynamic stability will improve Outcome: Progressing   Problem: Education: Goal: Knowledge of General Education information will improve Description: Including pain rating scale, medication(s)/side effects and non-pharmacologic comfort measures Outcome: Not Progressing   Problem: Health Behavior/Discharge Planning: Goal: Ability to manage health-related needs will improve Outcome: Not Progressing   Problem: Clinical Measurements: Goal: Diagnostic test results will improve Outcome: Not Progressing Goal: Respiratory complications will improve Outcome: Not Progressing   Problem: Nutrition: Goal: Adequate nutrition will be maintained Outcome: Not Progressing   Problem: Elimination: Goal: Will not experience complications related to bowel motility Outcome: Not Progressing Goal: Will not experience complications related to urinary retention Outcome: Not Progressing   Problem: Skin Integrity: Goal: Risk for impaired skin integrity will decrease Outcome: Not Progressing
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
## 2021-10-10 ENCOUNTER — Inpatient Hospital Stay (HOSPITAL_COMMUNITY): Payer: Medicare HMO

## 2021-10-10 DIAGNOSIS — Z7189 Other specified counseling: Secondary | ICD-10-CM

## 2021-10-10 DIAGNOSIS — A419 Sepsis, unspecified organism: Secondary | ICD-10-CM | POA: Diagnosis not present

## 2021-10-10 DIAGNOSIS — T17908A Unspecified foreign body in respiratory tract, part unspecified causing other injury, initial encounter: Secondary | ICD-10-CM

## 2021-10-10 DIAGNOSIS — R6521 Severe sepsis with septic shock: Secondary | ICD-10-CM | POA: Diagnosis not present

## 2021-10-10 DIAGNOSIS — Z21 Asymptomatic human immunodeficiency virus [HIV] infection status: Secondary | ICD-10-CM | POA: Diagnosis not present

## 2021-10-10 DIAGNOSIS — Z515 Encounter for palliative care: Secondary | ICD-10-CM

## 2021-10-10 LAB — TYPE AND SCREEN
ABO/RH(D): O POS
Antibody Screen: NEGATIVE
Unit division: 0
Unit division: 0
Unit division: 0

## 2021-10-10 LAB — AMMONIA: Ammonia: 123 umol/L — ABNORMAL HIGH (ref 9–35)

## 2021-10-10 LAB — CBC
HCT: 24.7 % — ABNORMAL LOW (ref 39.0–52.0)
Hemoglobin: 8 g/dL — ABNORMAL LOW (ref 13.0–17.0)
MCH: 31.6 pg (ref 26.0–34.0)
MCHC: 32.4 g/dL (ref 30.0–36.0)
MCV: 97.6 fL (ref 80.0–100.0)
Platelets: 41 10*3/uL — ABNORMAL LOW (ref 150–400)
RBC: 2.53 MIL/uL — ABNORMAL LOW (ref 4.22–5.81)
RDW: 17.1 % — ABNORMAL HIGH (ref 11.5–15.5)
WBC: 13.2 10*3/uL — ABNORMAL HIGH (ref 4.0–10.5)
nRBC: 0 % (ref 0.0–0.2)

## 2021-10-10 LAB — BPAM RBC
Blood Product Expiration Date: 202301252359
Blood Product Expiration Date: 202301282359
Blood Product Expiration Date: 202301302359
ISSUE DATE / TIME: 202212290652
ISSUE DATE / TIME: 202212291232
ISSUE DATE / TIME: 202212311050
Unit Type and Rh: 5100
Unit Type and Rh: 5100
Unit Type and Rh: 5100

## 2021-10-10 LAB — MAGNESIUM
Magnesium: 2.3 mg/dL (ref 1.7–2.4)
Magnesium: 2 mg/dL (ref 1.7–2.4)

## 2021-10-10 LAB — COMPREHENSIVE METABOLIC PANEL
ALT: 34 U/L (ref 0–44)
AST: 27 U/L (ref 15–41)
Albumin: <1.5 g/dL — ABNORMAL LOW (ref 3.5–5.0)
Alkaline Phosphatase: 225 U/L — ABNORMAL HIGH (ref 38–126)
Anion gap: 4 — ABNORMAL LOW (ref 5–15)
BUN: 5 mg/dL — ABNORMAL LOW (ref 6–20)
CO2: 26 mmol/L (ref 22–32)
Calcium: 7.8 mg/dL — ABNORMAL LOW (ref 8.9–10.3)
Chloride: 110 mmol/L (ref 98–111)
Creatinine, Ser: 0.8 mg/dL (ref 0.61–1.24)
GFR, Estimated: >60 mL/min (ref >60)
Glucose, Bld: 150 mg/dL — ABNORMAL HIGH (ref 70–99)
Potassium: 3.8 mmol/L (ref 3.5–5.1)
Sodium: 140 mmol/L (ref 135–145)
Total Bilirubin: 1.4 mg/dL — ABNORMAL HIGH (ref 0.3–1.2)
Total Protein: 5.9 g/dL — ABNORMAL LOW (ref 6.5–8.1)

## 2021-10-10 LAB — GLUCOSE, CAPILLARY
Glucose-Capillary: 125 mg/dL — ABNORMAL HIGH (ref 70–99)
Glucose-Capillary: 134 mg/dL — ABNORMAL HIGH (ref 70–99)
Glucose-Capillary: 136 mg/dL — ABNORMAL HIGH (ref 70–99)
Glucose-Capillary: 138 mg/dL — ABNORMAL HIGH (ref 70–99)
Glucose-Capillary: 140 mg/dL — ABNORMAL HIGH (ref 70–99)

## 2021-10-10 LAB — PHOSPHORUS
Phosphorus: 3 mg/dL (ref 2.5–4.6)
Phosphorus: 2.8 mg/dL (ref 2.5–4.6)

## 2021-10-10 MED ORDER — FUROSEMIDE 10 MG/ML IJ SOLN
20.0000 mg | Freq: Once | INTRAMUSCULAR | Status: AC
  Administered 2021-10-10: 20 mg via INTRAVENOUS
  Filled 2021-10-10: qty 2

## 2021-10-10 MED ORDER — LACTULOSE 10 GM/15ML PO SOLN
10.0000 g | Freq: Three times a day (TID) | ORAL | Status: DC
  Administered 2021-10-10 – 2021-10-12 (×7): 10 g
  Filled 2021-10-10 (×7): qty 15

## 2021-10-10 MED ORDER — DEXTROSE-NACL 5-0.9 % IV SOLN: INTRAVENOUS | Status: DC

## 2021-10-10 MED ORDER — POTASSIUM CHLORIDE 20 MEQ PO PACK
40.0000 meq | PACK | Freq: Every day | ORAL | Status: DC
  Administered 2021-10-10 – 2021-10-13 (×4): 40 meq
  Filled 2021-10-10 (×4): qty 2

## 2021-10-10 MED ORDER — LACTULOSE 10 GM/15ML PO SOLN: 10.0000 g | Freq: Three times a day (TID) | ORAL | Status: DC

## 2021-10-10 MED ORDER — DEXMEDETOMIDINE HCL IN NACL 200 MCG/50ML IV SOLN
0.4000 ug/kg/h | INTRAVENOUS | Status: DC
  Administered 2021-10-10 – 2021-10-11 (×2): 0.4 ug/kg/h via INTRAVENOUS
  Filled 2021-10-10 (×2): qty 50

## 2021-10-10 NOTE — Progress Notes (Signed)
Patient has pulled out his NG tube.  He continues to try to get out of bed.  States that he wants to be left alone.  He has a mild nose bleed from removal of NG tube.
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
## 2021-10-10 NOTE — Progress Notes (Signed)
eLink Physician-Brief Progress Note Patient Name: ARIAS WEINERT DOB: 24-Aug-1986 MRN: [id number]   Date of Service  10/10/2021  HPI/Events of Note  Patient has pulled out his NG tube x 2 during the day already and is refusing several parts of care. Seen on camera. Has intermittent agitation. Just says he wants to be left alone. Has intermittent confusion as well so am not sure he understands the gravity of his situation. From notes it appears that Palliative care was consulted for a goals of care discussion. Not on any infusions , tube feeds off since he pulled out his NG. Only medication due tonight is Vitamin C and lactulose at 10 pm.   eICU Interventions  Start precedex infusion due to agitation , Prolonged Qtc noted on EKG done last and he does have delirium Restraints ordered since he tries to get out of bed and pull devices  If he does calm down with this, we will attempt placing NG tube otherwise will need to re attempt in AM Tube feeds on hold so adding low dose fluids to prevent hypoglycemia Have d/w RN and seen him on camera     Intervention Category Major Interventions: Respiratory failure - evaluation and management  Margaretmary Lombard 10/10/2021, 7:58 PM

## 2021-10-11 ENCOUNTER — Inpatient Hospital Stay (HOSPITAL_COMMUNITY): Payer: Medicare HMO

## 2021-10-11 DIAGNOSIS — A419 Sepsis, unspecified organism: Secondary | ICD-10-CM | POA: Diagnosis not present

## 2021-10-11 DIAGNOSIS — L03317 Cellulitis of buttock: Secondary | ICD-10-CM | POA: Diagnosis not present

## 2021-10-11 DIAGNOSIS — R6521 Severe sepsis with septic shock: Secondary | ICD-10-CM | POA: Diagnosis not present

## 2021-10-11 DIAGNOSIS — T17908A Unspecified foreign body in respiratory tract, part unspecified causing other injury, initial encounter: Secondary | ICD-10-CM | POA: Diagnosis not present

## 2021-10-11 DIAGNOSIS — G9340 Encephalopathy, unspecified: Secondary | ICD-10-CM | POA: Diagnosis not present

## 2021-10-11 DIAGNOSIS — Z7189 Other specified counseling: Secondary | ICD-10-CM | POA: Diagnosis not present

## 2021-10-11 LAB — CULTURE, BLOOD (ROUTINE X 2)
Culture: NO GROWTH
Culture: NO GROWTH
Special Requests: ADEQUATE

## 2021-10-11 LAB — COMPREHENSIVE METABOLIC PANEL
ALT: 32 U/L (ref 0–44)
AST: 23 U/L (ref 15–41)
Albumin: <1.5 g/dL — ABNORMAL LOW (ref 3.5–5.0)
Alkaline Phosphatase: 234 U/L — ABNORMAL HIGH (ref 38–126)
Anion gap: 4 — ABNORMAL LOW (ref 5–15)
BUN: 9 mg/dL (ref 6–20)
CO2: 25 mmol/L (ref 22–32)
Calcium: 8.2 mg/dL — ABNORMAL LOW (ref 8.9–10.3)
Chloride: 110 mmol/L (ref 98–111)
Creatinine, Ser: 0.96 mg/dL (ref 0.61–1.24)
GFR, Estimated: >60 mL/min (ref >60)
Glucose, Bld: 120 mg/dL — ABNORMAL HIGH (ref 70–99)
Potassium: 4.1 mmol/L (ref 3.5–5.1)
Sodium: 139 mmol/L (ref 135–145)
Total Bilirubin: 1.2 mg/dL (ref 0.3–1.2)
Total Protein: 6.1 g/dL — ABNORMAL LOW (ref 6.5–8.1)

## 2021-10-11 LAB — CBC
HCT: 24.7 % — ABNORMAL LOW (ref 39.0–52.0)
Hemoglobin: 8 g/dL — ABNORMAL LOW (ref 13.0–17.0)
MCH: 32.4 pg (ref 26.0–34.0)
MCHC: 32.4 g/dL (ref 30.0–36.0)
MCV: 100 fL (ref 80.0–100.0)
Platelets: 43 10*3/uL — ABNORMAL LOW (ref 150–400)
RBC: 2.47 MIL/uL — ABNORMAL LOW (ref 4.22–5.81)
RDW: 17.2 % — ABNORMAL HIGH (ref 11.5–15.5)
WBC: 14.2 10*3/uL — ABNORMAL HIGH (ref 4.0–10.5)
nRBC: 0 % (ref 0.0–0.2)

## 2021-10-11 LAB — ACETAMINOPHEN LEVEL: Acetaminophen (Tylenol), Serum: <10 ug/mL — ABNORMAL LOW (ref 10–30)

## 2021-10-11 LAB — GLUCOSE, CAPILLARY
Glucose-Capillary: 111 mg/dL — ABNORMAL HIGH (ref 70–99)
Glucose-Capillary: 101 mg/dL — ABNORMAL HIGH (ref 70–99)
Glucose-Capillary: 120 mg/dL — ABNORMAL HIGH (ref 70–99)
Glucose-Capillary: 150 mg/dL — ABNORMAL HIGH (ref 70–99)
Glucose-Capillary: 127 mg/dL — ABNORMAL HIGH (ref 70–99)

## 2021-10-11 LAB — MAGNESIUM
Magnesium: 2.2 mg/dL (ref 1.7–2.4)
Magnesium: 2.2 mg/dL (ref 1.7–2.4)

## 2021-10-11 LAB — PHOSPHORUS
Phosphorus: 3.3 mg/dL (ref 2.5–4.6)
Phosphorus: 3.4 mg/dL (ref 2.5–4.6)

## 2021-10-11 MED ORDER — FUROSEMIDE 10 MG/ML IJ SOLN
20.0000 mg | Freq: Once | INTRAMUSCULAR | Status: AC
  Administered 2021-10-11: 20 mg via INTRAVENOUS
  Filled 2021-10-11: qty 2

## 2021-10-11 MED ORDER — DEXMEDETOMIDINE HCL IN NACL 200 MCG/50ML IV SOLN
0.0000 ug/kg/h | INTRAVENOUS | Status: DC
  Administered 2021-10-11 – 2021-10-12 (×3): 0.6 ug/kg/h via INTRAVENOUS
  Administered 2021-10-12 (×2): 0.4 ug/kg/h via INTRAVENOUS
  Administered 2021-10-13: 0.2 ug/kg/h via INTRAVENOUS
  Filled 2021-10-11 (×5): qty 50

## 2021-10-11 NOTE — Progress Notes (Signed)
[facility]   Reason for visit: Follow up on shock  Interval History: he remains off of pressor support today.  WBC 14.2, remains afebrile.  Does not voice any complaints.  Pulling out NGTs.     Day 6 total antibiotics  Physical Exam: Constitutional:  Vitals:   10/11/21 1300 10/11/21 1400  BP: 99/65 93/62  Pulse: (!) 41 (!) 37  Resp: 10 11  Temp:    SpO2: 96% 97%   patient appears in NAD Respiratory: Normal respiratory effort; CTA B Cardiovascular: RRR MS: + bilateral edema Skin: multiple lesions around and scaly bilateral legs.    Review of Systems: Unable to be assessed due to patient factors  Lab Results  Component Value Date   WBC 14.2 (H) 10/11/2021   HGB 8.0 (L) 10/11/2021   HCT 24.7 (L) 10/11/2021   MCV 100.0 10/11/2021   PLT 43 (L) 10/11/2021    Lab Results  Component Value Date   CREATININE 0.96 10/11/2021   BUN 9 10/11/2021   NA 139 10/11/2021   K 4.1 10/11/2021   CL 110 10/11/2021   CO2 25 10/11/2021    Lab Results  Component Value Date   ALT 32 10/11/2021   AST 23 10/11/2021   ALKPHOS 234 (H) 10/11/2021     Microbiology: Recent Results (from the past 240 hour(s))  Blood Culture (routine x 2)     Status: None   Collection Time: 10/06/21  6:55 PM   Specimen: BLOOD  Result Value Ref Range Status   Specimen Description   Final    BLOOD LEFT ANTECUBITAL Performed at [facility], [place] [address], [place], [place] [address]    Special Requests   Final    BOTTLES DRAWN AEROBIC AND ANAEROBIC Blood Culture results may not be optimal due to an inadequate volume of blood received in culture bottles Performed at [facility] [address], [place], [place] [address]    Culture   Final    NO GROWTH 5 DAYS Performed at [facility], [place] [address], [place], [place] [address]    Report Status 10/11/2021 FINAL  Final  Blood Culture (routine x 2)     Status: None   Collection  Time: 10/06/21  6:55 PM   Specimen: BLOOD  Result Value Ref Range Status   Specimen Description   Final    BLOOD RIGHT ANTECUBITAL Performed at [facility] [address], [place], [place] [address]    Special Requests   Final    BOTTLES DRAWN AEROBIC AND ANAEROBIC Blood Culture adequate volume Performed at [place] [address] [place], [place] [address]    Culture   Final    NO GROWTH 5 DAYS Performed at [facility], [place] [address], [place], [place] [address]    Report Status 10/11/2021 FINAL  Final  Resp Panel by RT-PCR (Flu A&B, Covid) Nasopharyngeal Swab     Status: None   Collection Time: 10/07/21  1:02 AM   Specimen: Nasopharyngeal Swab; Nasopharyngeal(NP) swabs in vial transport medium  Result Value Ref Range Status   SARS Coronavirus 2 by RT PCR NEGATIVE NEGATIVE Final    Comment: (NOTE) SARS-CoV-2 target nucleic acids are NOT DETECTED.  The SARS-CoV-2 RNA is generally detectable in upper respiratory specimens during the acute phase of infection. The lowest concentration of SARS-CoV-2 viral copies this assay can detect is 138 copies/mL. A negative result does not  preclude SARS-Cov-2 infection and should not be used as the sole basis for treatment or other patient management decisions. A negative result may occur with  improper specimen collection/handling, submission of specimen other than nasopharyngeal swab, presence of viral mutation(s) within the areas targeted by this assay, and inadequate number of viral copies(<138 copies/mL). A negative result must be combined with clinical observations, patient history, and epidemiological information. The expected result is Negative.  Fact Sheet for Patients:  EntrepreneurPulse.com.au  Fact Sheet for Healthcare Providers:  IncredibleEmployment.be  This test is no t yet approved or cleared by the [place] FDA and  has been  authorized for detection and/or diagnosis of SARS-CoV-2 by FDA under an Emergency Use Authorization (EUA). This EUA will remain  in effect (meaning this test can be used) for the duration of the COVID-19 declaration under Section 564(b)(1) of the Act, 21 U.S.C.section 360bbb-3(b)(1), unless the authorization is terminated  or revoked sooner.       Influenza A by PCR NEGATIVE NEGATIVE Final   Influenza B by PCR NEGATIVE NEGATIVE Final    Comment: (NOTE) The Xpert Xpress SARS-CoV-2/FLU/RSV plus assay is intended as an aid in the diagnosis of influenza from Nasopharyngeal swab specimens and should not be used as a sole basis for treatment. Nasal washings and aspirates are unacceptable for Xpert Xpress SARS-CoV-2/FLU/RSV testing.  Fact Sheet for Patients: EntrepreneurPulse.com.au  Fact Sheet for Healthcare Providers: IncredibleEmployment.be  This test is not yet approved or cleared by the [place] FDA and has been authorized for detection and/or diagnosis of SARS-CoV-2 by FDA under an Emergency Use Authorization (EUA). This EUA will remain in effect (meaning this test can be used) for the duration of the COVID-19 declaration under Section 564(b)(1) of the Act, 21 U.S.C. section 360bbb-3(b)(1), unless the authorization is terminated or revoked.  Performed at [facility], [place] [address] [place], [place] [address]   Urine Culture     Status: Abnormal   Collection Time: 10/07/21  2:00 AM   Specimen: In/Out Cath Urine  Result Value Ref Range Status   Specimen Description   Final    IN/OUT CATH URINE Performed at [facility] [address], [place], [place] [address]    Special Requests   Final    NONE Performed at [facility], [place] [address], [place], [place] [address]    Culture 20,000 COLONIES/mL ESCHERICHIA COLI (A)  Final   Report Status 10/09/2021 FINAL  Final   Organism  ID, Bacteria ESCHERICHIA COLI (A)  Final      Susceptibility   Escherichia coli - MIC*    AMPICILLIN <=2 SENSITIVE Sensitive     CEFAZOLIN <=4 SENSITIVE Sensitive     CEFEPIME <=0.12 SENSITIVE Sensitive     CEFTRIAXONE <=0.25 SENSITIVE Sensitive     CIPROFLOXACIN <=0.25 SENSITIVE Sensitive     GENTAMICIN <=1 SENSITIVE Sensitive     IMIPENEM <=0.25 SENSITIVE Sensitive     NITROFURANTOIN <=16 SENSITIVE Sensitive     TRIMETH/SULFA <=20 SENSITIVE Sensitive     AMPICILLIN/SULBACTAM <=2 SENSITIVE Sensitive     PIP/TAZO <=4 SENSITIVE Sensitive     * 20,000 COLONIES/mL ESCHERICHIA COLI  MRSA Next Gen by PCR, Nasal     Status: Abnormal   Collection Time: 10/07/21  5:00 AM   Specimen: Nasal Mucosa; Nasal Swab  Result Value Ref Range Status   MRSA by PCR Next Gen DETECTED (A) NOT DETECTED Final    Comment: RESULT CALLED TO, READ BACK BY  AND VERIFIED WITH: ROBINSON, A.  RN ON 10/07/2021 @ 0916 BY MECIAL J. (NOTE) The GeneXpert MRSA Assay (FDA approved for NASAL specimens only), is one component of a comprehensive MRSA colonization surveillance program. It is not intended to diagnose MRSA infection nor to guide or monitor treatment for MRSA infections. Test performance is not FDA approved in patients less than 83 years old. Performed at [facility], [place] [address] [place], [place] [address]     Impression/Plan:  1. Shock - this has resolved. May have been some element of infection but I suspect mainly nutrition related.  At this point he is off pressor support more than 24 hours, on hydrocortisone, so seems to be resolving.  This though has been a recurrent issue and nutrition support likely most beneficial for him, as is being done by the primary team.   2.  Urinary system gas - may be infection and E coli in culture and on ceftriaxone.  As above, this may be a contributor to his underlying shock and on antibiotics.  Improving and I recommend a 10 day treatment course of  antibiotics through 10/15/21.  Stop date placed.    3.  HIV - CD4 1169 and recent viral load in September < 20.  Previous viral loads also < 20 so he has maintained viral suppression.  Continue Biktarvy.  He follows at [facility].    No further ID needs at this time, I will sign off, call with questions.
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
## 2021-10-11 NOTE — Consult Note (Signed)
WOC Nurse Consult Note: Patient receiving care in [place] Reason for Consult:new open wounds to right under arm Wound type: Has the appearance of  Hidradenitis Supporativa however he has never been diagnosed with this. The right underarm has two small holes that are 0.8. cm in depth. Multiple areas of scarring from previous ulcers.  Pressure Injury POA: NA Wound bed: Pink Drainage (amount, consistency, odor) Serosanguinous on the foam dressing that was removed.  Periwound: intact Dressing procedure/placement/frequency: Placed a small piece of Xeroform gauze over the openings and secured with foam dressing.     Hurley's Grade I:  Abscess formation, single or multiple without sinus tracts and cicatrization. Hurley's Grade II:  Recurrent abscesses with tract formation and cicatrization.  Single or multiple, widely separated lesions. Hurley's Grade III:  Diffuse or near-diffuse involvement, or multiple interconnected tracts and abscesses across entire area.  Antibiotic therapy reported in the literature includes:  ( (2012). Hidradenitis Supporativa.  [place] of Medicine. 366(2) pg. 158-164.) (1) topical administration of clindamycin (10 mg per milliliter twice daily) over three months.   (2) Tetracycline 500 mg twice daily for three months.  (3) Clindaymycin and Rifampin 300 mg twice daily of both, length of time for administration was not indicated.   According to Judi Cong, [place] (2019). Hidradenitis Suppurativa. [employer] Clinical Advisor, e1, pg. [phone]: Topical clindamycin is appropriate for Hurley's Grade I. Cephalosporins, dicloxacillin, erythromycin, minocycline, and tetracycline may also prove helpful for Hurley's Grade II. Severe, recurrent disease can require 3 to 6 months of antibiotics.  Short courses of oral corticosteriods may be helpful in flares, but efficacy is lost after these area discontinued. Zinc salts (zinc gluconate 90 mg TID), and topical  metronidazole gel twice daily may also prove helpful. ([place] (2012). Hidradenitis Suppurativa.  Journal for Nurse Practitioners, 8(3). Pg. [phone].)  Monitor the wound area(s) for worsening of condition such as: Signs/symptoms of infection, increase in size, development of or worsening of odor, development of pain, or increased pain at the affected locations.   Notify the medical team if any of these develop.  Thank you for the consult. [place] nurse will not follow at this time.   Please re-consult the [place] team if needed.  Drew Marseilles Tamala Julian, MSN, RN, [place], [place], [facility] Wound Treatment Associate Pager [phone]

## 2021-10-11 NOTE — Progress Notes (Signed)
SLP Cancellation Note  Patient Details Name: [name] MRN: [id number] DOB: 04/02/1986   Cancelled treatment:       Reason Eval/Treat Not Completed: Patient's level of consciousness;Other (comment) (Patient has pulled out NG x2 and refusing interventions, intermittently agitated. SLP will follow for readiness.)  Sonia Baller, MA, CCC-SLP Speech Therapy

## 2021-10-11 NOTE — Consult Note (Signed)
Consultation Note Date: 10/11/2021   Patient Name: [name]  DOB: [birth year]  MRN: [id number]  Age / Sex: 36 y.o., male  PCP: [name], MD Referring Physician: Brand Males, MD  Reason for Consultation: Establishing goals of care  HPI/Patient Profile: 36 y.o. male  with past medical history of HIV, bipolar, depression, hypertension, migraine, schizoaffective disorder, seizures admitted on 10/06/2021 with hypothermia and concern for infected wounds.  At time of admission, he was started on Zosyn and vancomycin.  Labs revealed hypokalemia and elevated lactate.  Fluid resuscitation started.  He is remained very weak and continues to have significant dysphagia.  NG tube was placed to dysphagia.  Palliative consulted for goals of care.  Clinical Assessment and Goals of Care: Palliative care consult received.  Chart reviewed including personal review of pertinent labs and imaging.  I saw and examined [name] today.  He was lying in bed in no distress at time of my encounter.  He was sleepy but remained awake throughout encounter.  He has a very weak voice and it is difficult to understand him at times.  We discussed his clinical situation and concerns about his nutrition and functional status.  He very much minimizes concerns and states he wants to go home and live independently.  I tried to talk to them about his understanding of the situation overall and he stated, "I know."  But was not really able to relate any specific details regarding his situation either during or prior to this hospitalization.  Became frustrated when asked for more help understand his overall situation and asked me to leave and let him rest.  I called and was able to reach his mother.  We discussed his clinical course this admission and concerns about the continued decline in nutrition, cognition, and functional status.  She  reports being frustrated by the fact that he does not care for himself outside of the hospital and she wishes she could do something to change his situation.  She very much feels he cannot go back to living alone in his prior living situation.  She also reports she is not able to care for him due to her own medical problems and his refusal to consider allowing her to help.  We discussed the fact that, currently, I do not think that [name] understands the severity of the situation.  We had discussion regarding limitations of care that reinforced recommendation from Dr. Chase Caller to continue with full medical care including potential for short-term intubation if needed but consider limit of no CPR.  She reports that she would like to discuss further with his brother.  I offered to call him today but she reports he is unavailable and she will discuss with him this evening.  SUMMARY OF RECOMMENDATIONS   -Full code/full scope -While he is awake and alert, [name] does not appear to have insight into his medical condition at this point.  He is not able to relay back information to me nor does he appear to be  able to process information presented to him.  Discussed with patient's mother today and we talked about his long-term failure to thrive.  She is very concerned about him stating he wants to go back to his prior living arrangement as he is unable to care for himself.  We discussed limits of care including recommendation for no CPR in the event of cardiac arrest.  She reports she is going to discuss further with his brother.  I offered to call him today and she declined for me to do so as she states he is unavailable and she will talk to them later this evening. -Palliative to continue to follow and progress conversation based upon his clinical course and family's emotional ability to engage in conversation. Code Status/Advance Care Planning: Full code  Psycho-social/Spiritual:  Desire for further  Chaplaincy support:no Additional Recommendations: Caregiving  Support/Resources  Prognosis:  Unable to determine  Discharge Planning: To Be Determined      Primary Diagnoses: Present on Admission:  HIV positive ([place])  Hypothermia  Hypokalemia  Septic shock (HCC)  Hyponatremia  Macrocytic anemia  Thrombocytopenia (HCC)  Prolonged QT interval  Elevated LFTs  Avascular necrosis of femoral head, left (HCC)  Hepatic steatosis  Cellulitis of multiple sites of buttock  Protein-calorie malnutrition, severe ([place])   I have reviewed the medical record, interviewed the patient and family, and examined the patient. The following aspects are pertinent.  Past Medical History:  Diagnosis Date   Bipolar 1 disorder ([place])    Depression    Dizziness and giddiness 02/01/2016   Herpes genitalia    HIV disease ([place])    Hypertension    Migraine headache 02/01/2016   Peripheral neuropathy 10/01/2019   PTSD (post-traumatic stress disorder)    Schizoaffective disorder (HCC)    Seizures ([place])    Social History   Socioeconomic History   Marital status: Single    Spouse name: Not on file   Number of children: Not on file   Years of education: Not on file   Highest education level: Not on file  Occupational History   Occupation: [employer]  Tobacco Use   Smoking status: Every Day    Packs/day: 1.00    Types: Cigarettes   Smokeless tobacco: Never  Vaping Use   Vaping Use: Never used  Substance and Sexual Activity   Alcohol use: Yes    Alcohol/week: 14.0 standard drinks    Types: 14 Cans of beer per week    Comment: 5-6 40oz per day   Drug use: No   Sexual activity: Not Currently    Birth control/protection: Condom  Other Topics Concern   Not on file  Social History Narrative   Lives at home alone   Right-handed   Drinks 2-3 sodas per day   Social Determinants of Health   Financial Resource Strain: Not on file  Food Insecurity: Not on file  Transportation Needs: Not on file   Physical Activity: Not on file  Stress: Not on file  Social Connections: Not on file   Family History  Problem Relation Age of Onset   Alcohol abuse Mother    Schizophrenia Father    Depression Father    Alcohol abuse Father    Alcohol abuse Paternal Uncle    Alcohol abuse Paternal Uncle    Scheduled Meds:  acetaminophen  650 mg Per Tube Q6H   vitamin C  500 mg Per Tube BID   bictegravir-emtricitabine-tenofovir AF  1 tablet Oral Daily   budesonide (PULMICORT)  nebulizer solution  0.25 mg Nebulization BID   chlorhexidine  15 mL Mouth Rinse BID   Chlorhexidine Gluconate Cloth  6 each Topical Q0600   escitalopram  20 mg Per Tube Daily   feeding supplement (PIVOT 1.5 CAL)  1,000 mL Per Tube I69G   folic acid  1 mg Per Tube Daily   free water  100 mL Per Tube Q4H   hydrocortisone sod succinate (SOLU-CORTEF) inj  50 mg Intravenous Q12H   ipratropium-albuterol  3 mL Nebulization BID   lactulose  10 g Per Tube TID   lurasidone  120 mg Per Tube Daily   mouth rinse  15 mL Mouth Rinse q12n4p   midodrine  10 mg Per Tube TID WC   multivitamin  15 mL Per Tube Daily   mupirocin ointment   Nasal BID   nicotine  14 mg Transdermal Daily   pantoprazole sodium  40 mg Per Tube Daily   potassium chloride  40 mEq Per Tube Daily   thiamine injection  100 mg Intravenous Daily   thiamine  250 mg Per Tube Daily   valACYclovir  1,000 mg Per Tube Daily   zinc sulfate  220 mg Per Tube Daily   Continuous Infusions:  sodium chloride 250 mL (10/07/21 1300)   sodium chloride 120 mL/hr at 10/07/21 0500   cefTRIAXone (ROCEPHIN)  IV Stopped (10/10/21 2145)   dexmedetomidine (PRECEDEX) IV infusion Stopped (10/10/21 2215)   dextrose 5 % and 0.9% NaCl 50 mL/hr at 10/10/21 2042   famotidine (PEPCID) IV Stopped (10/11/21 0000)   norepinephrine (LEVOPHED) Adult infusion Stopped (10/10/21 0405)   PRN Meds:.albuterol, docusate, food thickener, polyethylene glycol Medications Prior to Admission:  Prior to  Admission medications   Medication Sig Start Date End Date Taking? Authorizing Provider  albuterol (PROVENTIL HFA;VENTOLIN HFA) 108 (90 BASE) MCG/ACT inhaler Inhale 2 puffs into the lungs every 6 (six) hours as needed for wheezing or shortness of breath.    [provider]  alprazolam Duanne Moron) 2 MG tablet Take 2 mg by mouth 3 (three) times daily.    [provider]  benzonatate (TESSALON) 100 MG capsule Take 200 mg by mouth every 8 (eight) hours as needed for cough. 10/23/19   [provider]  bictegravir-emtricitabine-tenofovir AF (BIKTARVY) 50-200-25 MG TABS tablet Take 1 tablet by mouth daily.  02/24/17   [provider]  cyclobenzaprine (FLEXERIL) 10 MG tablet Take 10 mg by mouth 3 (three) times daily as needed for muscle spasms. 08/19/21   [provider]  diphenoxylate-atropine (LOMOTIL) 2.5-0.025 MG tablet Take 1 tablet by mouth every 8 (eight) hours as needed for diarrhea or loose stools. 02/16/21   [provider]  doxycycline (VIBRA-TABS) 100 MG tablet Take 1 tablet (100 mg total) by mouth every 12 (twelve) hours. 08/31/21 10/30/21  Raiford Noble Latif, DO  escitalopram (LEXAPRO) 20 MG tablet Take 1 tablet by mouth daily. 02/16/21   [provider]  famotidine (PEPCID) 40 MG tablet Take 40 mg by mouth every morning. 08/19/21   [provider]  folic acid (FOLVITE) 1 MG tablet Take 1 mg by mouth daily. 10/03/19   [provider]  food thickener (SIMPLYTHICK, NECTAR/LEVEL 2/MILDLY THICK,) GEL Take 1 packet by mouth as needed. 08/31/21   Raiford Noble Latif, DO  furosemide (LASIX) 40 MG tablet Take 40 mg by mouth daily as needed for fluid. 02/27/20   [provider]  haloperidol (HALDOL) 5 MG tablet Take 5 mg by mouth 2 (two) times  daily. 08/19/21   [provider]  HYDROcodone-acetaminophen (NORCO/VICODIN) 5-325 MG tablet Take 1 tablet by mouth 2 (two) times daily as needed. 08/09/21   [provider]  ibuprofen (ADVIL) 800 MG tablet Take 800 mg by mouth 3 (three) times daily. 09/09/21   [provider]  lactulose (CHRONULAC) 10 GM/15ML solution Take 30 mLs (20 g total) by mouth 2 (two) times daily. 08/31/21   Raiford Noble Latif, DO  lurasidone 120 MG TABS Take 1 tablet (120 mg total) by mouth daily. 05/15/15   Niel Hummer, NP  magnesium oxide (MAG-OX) 400 (240 Mg) MG tablet Take 1 tablet (400 mg total) by mouth daily. 05/19/21   Geradine Girt, DO  meclizine (ANTIVERT) 25 MG tablet Take 25 mg by mouth every 8 (eight) hours as needed. 06/18/21   [provider]  midodrine (PROAMATINE) 10 MG tablet Take 1 tablet (10 mg total) by mouth 3 (three) times daily with meals. 08/31/21   Raiford Noble Latif, DO  Multiple Vitamin (MULTIVITAMIN WITH MINERALS) TABS tablet Take 1 tablet by mouth daily. 09/01/21   Sheikh, Omair Latif, DO  nicotine (NICODERM CQ - DOSED IN MG/24 HOURS) 14 mg/24hr patch Place 1 patch (14 mg total) onto the skin daily. 09/01/21   Raiford Noble Latif, DO  Nutritional Supplements (,FEEDING SUPPLEMENT, PROSOURCE PLUS) liquid Take 30 mLs by mouth 4 (four) times daily. 08/31/21   Raiford Noble Latif, DO  ondansetron (ZOFRAN) 8 MG tablet Take 8 mg by mouth every 8 (eight) hours as needed for nausea/vomiting, nausea or vomiting. 10/03/19   [provider]  pantoprazole (PROTONIX) 40 MG tablet Take 1 tablet (40 mg total) by mouth daily. 05/20/21   Geradine Girt, DO  thiamine 100 MG tablet Take 2.5 tablets (250 mg total) by mouth daily. 05/20/21   Geradine Girt, DO  thiamine 100 MG tablet Take 1 tablet (100 mg total) by mouth daily. 09/01/21   Raiford Noble Latif, DO  topiramate (TOPAMAX) 100 MG tablet Take 100 mg by mouth at bedtime. 06/18/21   [provider]  traZODone (DESYREL) 100 MG tablet Take 2 tablets (200 mg total) by mouth at bedtime as needed for sleep. 05/19/21   Geradine Girt, DO  traZODone (DESYREL) 50 MG tablet Take 200 mg by  mouth at bedtime. 08/19/21   [provider]  valACYclovir (VALTREX) 1000 MG tablet Take 1 tablet (1,000 mg total) by mouth daily. 05/15/15   Niel Hummer, NP   Allergies  Allergen Reactions   Dapsone Other (See Comments)    Per centricity "G6PD deficient"   Primaquine Phosphate Other (See Comments)    Per Centricity "G6PD deficient"   Review of Systems Denies complaints but poor historian  Physical Exam General: Alert, awake, in no acute distress.  Slow to respond to questions.  Very soft voice/mumbles and difficult to understand HEENT: No bruits, no goiter, no JVD Heart: Regular rate and rhythm. No murmur appreciated. Lungs: Scattered coarse Abdomen: Soft, nontender, nondistended, positive bowel sounds.   Ext: No significant edema Skin: Warm and dry, wounds not examined    Vital Signs: BP 117/81 (BP Location: Left Arm)    Pulse (!) 59    Temp (!) 97.5 F (36.4 C) (Axillary)    Resp 13    Ht 6' 3"  (1.905 m)    Wt 110.1 kg    SpO2 100%    BMI 30.34 kg/m  Pain Scale: CPOT   Pain Score: 0-No pain  SpO2: SpO2: 100 % O2 Device:SpO2: 100 % O2 Flow Rate: .O2 Flow Rate (L/min): 2 L/min  IO: Intake/output summary:  Intake/Output Summary (Last 24 hours) at 10/11/2021 0659 Last data filed at 10/11/2021 5379 Gross per 24 hour  Intake 1358.33 ml  Output 975 ml  Net 383.33 ml    LBM: Last BM Date: 10/09/21 Baseline Weight: Weight: 108.9 kg Most recent weight: Weight: 110.1 kg     Palliative Assessment/Data:   Flowsheet Rows    Flowsheet Row Most Recent Value  Intake Tab   Referral Department Critical care  Unit at Time of Referral ICU  Palliative Care Primary Diagnosis Sepsis/Infectious Disease  Date Notified 10/09/21  Clinical Assessment   Psychosocial & Spiritual Assessment   Palliative Care Outcomes        Time In: 4327 Time Out: 1500 Time Total: 80 Greater than 50%  of this time was spent counseling and coordinating care related to the above  assessment and plan.  Signed by: Micheline Rough, MD   Please contact Palliative Medicine Team phone at [phone] for questions and concerns.  For individual provider: See Shea Evans

## 2021-10-12 ENCOUNTER — Inpatient Hospital Stay (HOSPITAL_COMMUNITY): Payer: Medicare HMO

## 2021-10-12 DIAGNOSIS — R6521 Severe sepsis with septic shock: Secondary | ICD-10-CM | POA: Diagnosis not present

## 2021-10-12 DIAGNOSIS — A419 Sepsis, unspecified organism: Secondary | ICD-10-CM | POA: Diagnosis not present

## 2021-10-12 LAB — COMPREHENSIVE METABOLIC PANEL
ALT: 32 U/L (ref 0–44)
AST: 31 U/L (ref 15–41)
Albumin: <1.5 g/dL — ABNORMAL LOW (ref 3.5–5.0)
Alkaline Phosphatase: 271 U/L — ABNORMAL HIGH (ref 38–126)
Anion gap: 3 — ABNORMAL LOW (ref 5–15)
BUN: 19 mg/dL (ref 6–20)
CO2: 27 mmol/L (ref 22–32)
Calcium: 8.2 mg/dL — ABNORMAL LOW (ref 8.9–10.3)
Chloride: 113 mmol/L — ABNORMAL HIGH (ref 98–111)
Creatinine, Ser: 1.18 mg/dL (ref 0.61–1.24)
GFR, Estimated: >60 mL/min (ref >60)
Glucose, Bld: 162 mg/dL — ABNORMAL HIGH (ref 70–99)
Potassium: 4 mmol/L (ref 3.5–5.1)
Sodium: 143 mmol/L (ref 135–145)
Total Bilirubin: 1 mg/dL (ref 0.3–1.2)
Total Protein: 5.9 g/dL — ABNORMAL LOW (ref 6.5–8.1)

## 2021-10-12 LAB — GLUCOSE, CAPILLARY
Glucose-Capillary: 126 mg/dL — ABNORMAL HIGH (ref 70–99)
Glucose-Capillary: 129 mg/dL — ABNORMAL HIGH (ref 70–99)
Glucose-Capillary: 132 mg/dL — ABNORMAL HIGH (ref 70–99)
Glucose-Capillary: 136 mg/dL — ABNORMAL HIGH (ref 70–99)
Glucose-Capillary: 149 mg/dL — ABNORMAL HIGH (ref 70–99)
Glucose-Capillary: 156 mg/dL — ABNORMAL HIGH (ref 70–99)
Glucose-Capillary: 165 mg/dL — ABNORMAL HIGH (ref 70–99)
Glucose-Capillary: 168 mg/dL — ABNORMAL HIGH (ref 70–99)

## 2021-10-12 LAB — PHOSPHORUS
Phosphorus: 2.8 mg/dL (ref 2.5–4.6)
Phosphorus: 2.3 mg/dL — ABNORMAL LOW (ref 2.5–4.6)

## 2021-10-12 LAB — CBC
HCT: 22.3 % — ABNORMAL LOW (ref 39.0–52.0)
Hemoglobin: 7 g/dL — ABNORMAL LOW (ref 13.0–17.0)
MCH: 32.1 pg (ref 26.0–34.0)
MCHC: 31.4 g/dL (ref 30.0–36.0)
MCV: 102.3 fL — ABNORMAL HIGH (ref 80.0–100.0)
Platelets: 45 10*3/uL — ABNORMAL LOW (ref 150–400)
RBC: 2.18 MIL/uL — ABNORMAL LOW (ref 4.22–5.81)
RDW: 17.1 % — ABNORMAL HIGH (ref 11.5–15.5)
WBC: 10 10*3/uL (ref 4.0–10.5)
nRBC: 0 % (ref 0.0–0.2)

## 2021-10-12 LAB — PROTIME-INR
INR: 1.5 — ABNORMAL HIGH (ref 0.8–1.2)
Prothrombin Time: 17.6 seconds — ABNORMAL HIGH (ref 11.4–15.2)

## 2021-10-12 LAB — MAGNESIUM
Magnesium: 2.2 mg/dL (ref 1.7–2.4)
Magnesium: 1.7 mg/dL (ref 1.7–2.4)

## 2021-10-12 MED ORDER — PHYTONADIONE 1 MG/0.5 ML ORAL SOLUTION
1.0000 mg | Freq: Once | ORAL | Status: AC
  Administered 2021-10-12: 1 mg via ORAL
  Filled 2021-10-12: qty 0.5

## 2021-10-12 MED ORDER — OLANZAPINE 10 MG IM SOLR
10.0000 mg | Freq: Once | INTRAMUSCULAR | Status: AC | PRN
  Administered 2021-10-13: 10 mg via INTRAMUSCULAR
  Filled 2021-10-12: qty 10

## 2021-10-12 MED ORDER — MAGNESIUM SULFATE 2 GM/50ML IV SOLN
2.0000 g | Freq: Once | INTRAVENOUS | Status: AC
  Administered 2021-10-12: 2 g via INTRAVENOUS
  Filled 2021-10-12: qty 50

## 2021-10-12 MED ORDER — TETRACYCLINE HCL 250 MG PO CAPS
500.0000 mg | ORAL_CAPSULE | Freq: Two times a day (BID) | ORAL | Status: DC
  Administered 2021-10-12 (×2): 500 mg
  Filled 2021-10-12 (×3): qty 2

## 2021-10-12 MED ORDER — HYDROCORTISONE SOD SUC (PF) 100 MG IJ SOLR
25.0000 mg | Freq: Two times a day (BID) | INTRAMUSCULAR | Status: DC
  Administered 2021-10-12: 25 mg via INTRAVENOUS
  Filled 2021-10-12: qty 2

## 2021-10-12 MED ORDER — MIDODRINE HCL 5 MG PO TABS
5.0000 mg | ORAL_TABLET | Freq: Three times a day (TID) | ORAL | Status: DC
  Administered 2021-10-12 (×2): 5 mg
  Filled 2021-10-12 (×3): qty 1

## 2021-10-12 NOTE — Progress Notes (Signed)
eLink Physician-Brief Progress Note Patient Name: [name] DOB: 07/26/1986 MRN: [id number]   Date of Service  10/12/2021  HPI/Events of Note  Patient very agitated, kicking and spitting at the bedside nurse. Precedex gtt cannot be increased due to bradycardia.  eICU Interventions  Zyprexa 10 mg IM x 1 ordered, ankle restraints ordered for safety.        [name] Kelechi Orgeron 10/12/2021, 11:45 PM

## 2021-10-12 NOTE — Progress Notes (Signed)
Daily Progress Note   Patient Name: [name]       Date: 10/12/2021 DOB: 08-Jul-1986  Age: 36 y.o. MRN#: [id number] Attending Physician: Drew Males, MD Primary Care Physician: Drew Ebbs, MD Admit Date: 10/06/2021  Reason for Consultation/Follow-up: Establishing goals of care  Subjective: I saw and examined [name] today.  He was very sleepy and cannot participate in conversation.  I called and was able to reach his mother.  We reviewed his clinical course including the fact that he has been more agitated and pulled out NG tube on multiple occasions.  She expressed again that her concern is that he is not going to be able to care for himself and lives independently.  We talked about long-term goals of establishing appropriate living situation but we also discussed short-term goals that he remains critically ill.  She reports that family continues to discuss his overall situation and recommendations for consideration for limitations of care.  At this point, they are invested in continuation of current interventions.  Length of Stay: 5  Current Medications: Scheduled Meds:   vitamin C  500 mg Per Tube BID   bictegravir-emtricitabine-tenofovir AF  1 tablet Oral Daily   budesonide (PULMICORT) nebulizer solution  0.25 mg Nebulization BID   chlorhexidine  15 mL Mouth Rinse BID   Chlorhexidine Gluconate Cloth  6 each Topical Q0600   escitalopram  20 mg Per Tube Daily   feeding supplement (PIVOT 1.5 CAL)  1,000 mL Per Tube F12R   folic acid  1 mg Per Tube Daily   free water  100 mL Per Tube Q4H   hydrocortisone sod succinate (SOLU-CORTEF) inj  50 mg Intravenous Q12H   ipratropium-albuterol  3 mL Nebulization BID   lactulose  10 g Per Tube TID   lurasidone  120 mg Per Tube  Daily   mouth rinse  15 mL Mouth Rinse q12n4p   midodrine  10 mg Per Tube TID WC   multivitamin  15 mL Per Tube Daily   mupirocin ointment   Nasal BID   nicotine  14 mg Transdermal Daily   pantoprazole sodium  40 mg Per Tube Daily   potassium chloride  40 mEq Per Tube Daily   thiamine injection  100 mg Intravenous Daily   thiamine  250 mg Per Tube Daily  valACYclovir  1,000 mg Per Tube Daily   zinc sulfate  220 mg Per Tube Daily    Continuous Infusions:  sodium chloride 250 mL (10/07/21 1300)   sodium chloride 250 mL (10/12/21 0433)   cefTRIAXone (ROCEPHIN)  IV Stopped (10/11/21 2113)   dexmedetomidine (PRECEDEX) IV infusion 0.6 mcg/kg/hr (10/12/21 0555)   dextrose 5 % and 0.9% NaCl Stopped (10/11/21 1232)   famotidine (PEPCID) IV Stopped (10/11/21 2230)   norepinephrine (LEVOPHED) Adult infusion Stopped (10/10/21 0405)    PRN Meds: albuterol, docusate, food thickener, polyethylene glycol  Physical Exam         General: Sleeping, in no acute distress.   HEENT: No bruits, no goiter, no JVD Heart: Regular rate and rhythm. No murmur appreciated. Lungs: Good air movement, clear Abdomen: Soft, nontender, nondistended, positive bowel sounds.   Ext: No significant edema Skin: Warm and dry  Vital Signs: BP 107/65 (BP Location: Left Arm)    Pulse 81    Temp 98.4 F (36.9 C) (Oral)    Resp 16    Ht 6' 3"  (1.905 m)    Wt 111 kg    SpO2 96%    BMI 30.59 kg/m  SpO2: SpO2: 96 % O2 Device: O2 Device: Room Air O2 Flow Rate: O2 Flow Rate (L/min): 2 L/min  Intake/output summary:  Intake/Output Summary (Last 24 hours) at 10/12/2021 7741 Last data filed at 10/12/2021 0600 Gross per 24 hour  Intake 2033.41 ml  Output 1925 ml  Net 108.41 ml   LBM: Last BM Date: 10/11/21 Baseline Weight: Weight: 108.9 kg Most recent weight: Weight: 111 kg       Palliative Assessment/Data:    Flowsheet Rows    Flowsheet Row Most Recent Value  Intake Tab   Referral Department Critical care  Unit  at Time of Referral ICU  Palliative Care Primary Diagnosis Sepsis/Infectious Disease  Date Notified 10/09/21  Palliative Care Type New Palliative care  Reason for referral Clarify Goals of Care  Date of Admission 10/06/21  Date first seen by Palliative Care 10/10/21  # of days Palliative referral response time 1 Day(s)  # of days IP prior to Palliative referral 3  Clinical Assessment   Palliative Performance Scale Score 30%  Psychosocial & Spiritual Assessment   Palliative Care Outcomes   Patient/Family meeting held? Yes  Who was at the meeting? Patient, mother via phone       Patient Active Problem List   Diagnosis Date Noted   Protein-calorie malnutrition, severe ([place]) 10/09/2021   Malnutrition of moderate degree 10/09/2021   HIV positive ([place]) 10/07/2021   Avascular necrosis of femoral head, left ([place]) 10/07/2021   Hepatic steatosis 10/07/2021   Cellulitis of multiple sites of buttock 10/07/2021   Hypotension    Hypothermia 08/24/2021   Hypoxia 08/24/2021   Septic shock ([place]) 08/23/2021   Left hip pain    Transaminitis    Acute metabolic encephalopathy 28/78/6767   Cellulitis 05/07/2021   Lymphadenopathy 09/29/2020   Anemia    Upper urinary tract infection    Sepsis secondary to UTI ([place]) 03/14/2020   Cellulitis of groin 03/14/2020   Elevated LFTs 03/14/2020   HIV disease ([place])    Acute encephalopathy    Hyponatremia    Macrocytic anemia    Thrombocytopenia (HCC)    Prolonged QT interval    Acute respiratory failure due to COVID-19 ([place]) 10/27/2019   GERD (gastroesophageal reflux disease) 10/27/2019   Hypokalemia 10/27/2019   Peripheral neuropathy 10/01/2019   PTSD (  post-traumatic stress disorder) 07/23/2018   Dizziness and giddiness 02/01/2016   Migraine headache 02/01/2016   Schizoaffective disorder, depressive type ([place]) 05/13/2015   Severe alcohol dependence ([place]) 05/13/2015   Suicidal ideation 01/12/2014    Palliative Care Assessment & Plan    Patient Profile: 35 y.o. male  with past medical history of HIV, bipolar, depression, hypertension, migraine, schizoaffective disorder, seizures admitted on 10/06/2021 with hypothermia and concern for infected wounds.  At time of admission, he was started on Zosyn and vancomycin.  Labs revealed hypokalemia and elevated lactate.  Fluid resuscitation started.  He is remained very weak and continues to have significant dysphagia.  NG tube was placed to dysphagia.  Palliative consulted for goals of care.  Recommendations/Plan: Full code/full scope Continued discussion with family regarding the fact that he remains critically ill and consideration for limitation of DNR in the event of cardiac arrest.  Family continues to discuss goals amongst themselves.  Goals of Care and Additional Recommendations: Limitations on Scope of Treatment: Full Scope Treatment  Code Status:    Code Status Orders  (From admission, onward)           Start     Ordered   10/07/21 0225  Full code  Continuous        10/07/21 0228           Code Status History     Date Active Date Inactive Code Status Order ID Comments User Context   08/24/2021 0200 09/01/2021 0258 Full Code [id number]  [name], MD ED   05/07/2021 1912 05/19/2021 1841 Full Code [id number]  [name], MD ED   03/15/2020 0606 03/20/2020 1650 Full Code [id number]  [name], MD ED   10/27/2019 1815 11/02/2019 1902 Full Code [id number]  [name], MD Inpatient   07/22/2018 1722 07/22/2018 2158 Full Code [id number]  [name], NP Inpatient   07/22/2018 0832 07/22/2018 1703 Full Code [id number]  [name], MD ED   05/12/2015 1753 05/15/2015 1723 Full Code [id number]  [name], NP Inpatient   05/12/2015 0546 05/12/2015 1753 Full Code [id number]  [name], MD ED   01/12/2014 0256 01/12/2014 1650 Full Code [id number]  [name], MD ED       Prognosis:  Unable to determine  Discharge  Planning: To Be Determined  Care plan was discussed with mother  Thank you for allowing the Palliative Medicine Team to assist in the care of this patient.   Time In: 1850 Time Out: 1920 Total Time 30 Prolonged Time Billed No   Micheline Rough, MD  Please contact Palliative Medicine Team phone at [phone] for questions and concerns.

## 2021-10-12 NOTE — Progress Notes (Signed)
Patient agitated with any touch, care, or verbal contact. Unable to titrate Precedex due to bradycardia. Patient trying to kick and spit. Mask put on face and bilateral ankle restraints placed. Called E-link and spoke to Arelia Sneddon, RN to obtain orders for ankle restraints.

## 2021-10-12 NOTE — Progress Notes (Addendum)
NAME:  [name] MRN:  [id number] DOB:  1985-12-23 LOS: 5 ADMISSION DATE:  10/06/2021 DATE OF SERVICE:  10/07/2021  CHIEF COMPLAINT: Sore on buttocks  HISTORY & PHYSICAL  BRIEF  This 36 y.o. African-American male, HIV positive smoker presented to the [facility] Emergency Department via EMS with complaints of sore on his buttocks.  He apparently had a visit from the home health service earlier today, who raised concerns that the sores may have become infected and advised him to present to the emergency department.  The patient reported that he does endorse fatigue, more than normal.  He was noted to be hypothermic (92.5 F) on arrival.  He was started on empiric Zosyn/vancomycin.  Initial labs revealed hypokalemia (2.7) and elevated lactate (6.4).  He was also noted to have abnormal LFTs with a cholestatic pattern.  Bear hugger was placed.  IV fluid resuscitation was started.  BP was noted to trend downward.  Norepinephrine infusion started.    Past Medical/Surgical/Social/Family History   has a past medical history of Bipolar 1 disorder ([name]), Depression, Dizziness and giddiness (02/01/2016), Herpes genitalia, HIV disease ([name]), Hypertension, Migraine headache (02/01/2016), Peripheral neuropathy (10/01/2019), PTSD (post-traumatic stress disorder), Schizoaffective disorder ([place]), and Seizures ([name]).   has a past surgical history that includes Hand surgery; Back surgery; Colonoscopy with propofol ([name], 02/26/2021); Esophagogastroduodenoscopy (egd) with propofol ([name], 02/26/2021); biopsy (02/26/2021); and Esophagogastroduodenoscopy (egd) with propofol ([name], 05/18/2021). Known positive syphilis serology - Prior to & Present on Admit  Septic shock admt - nov 2022 - urinary source (gas on CT) versus lowr torsor hidradeitis v cellulits - most likely e colii UTI  Procedures:  CVC (12/29)    EVENTS   10/07/21 -on 2 L nasal cannula.  He is vasopressin and Levophed.  Also on  fluid lactated Ringer.  He did get transfused platelets and RBC.  His temperature was low with a white count of 11 point 4K.  His MRSA PCR was positive.  Rest of cultures pending.  Duplex lower extremity negative for DVT.  Admit QTC was 600 ms.  Improved later in the day to 480 ms. - Psych consult 10/08/21 recommended discontinuing Haldol, Topamax and trazodone permanently 10/08/21 -he is off vasopressin.  Levophed is down to 3 mcg/min.  QTC has improved to 480 ms after his Haldol was stopped.  He had gas in his urinary system.  Urology recommended Foley catheter but he refused.  - Psych consult - [name]. Stop haldol.  Stop trazodone..  Stop Topamax.  Continue Xanax 2 mg 3 times daily, Latuda and Lexapro.  Follow-up with Dr. Acquanetta Chain outpatient psychiatry  - failed even D3 diet due to dysphagia. Severe aspiration  - refused foley   10/09/21 -remains on intubated.  Possible hypothermia.  WBC count improved to 12,200.  On 2 L nasal cannula.  Off vasopressin.  Appears to be on low-dose Levophed.  Because nursing concern is that he seems to have overflow incontinence.  He is refused Foley and nursing also has been unable to get Foley when he agreed to it.  If 100 cc bladder retention this morning.  Urine cultures growing E. coli.  He also appears to be with continued sleepiness although when aroused he is able to answer questions with a soft voice  -Foley catheter placed by urology Dr. Diona Fanti  -Tube feeds started [n.p.o. due to severe aspiration]  -Palliative care consult called  10/10/21 - currently on room air. [name]  He is on  tube feeds.  He is on Foley catheter he is afebrile.  He is off pressors at this point.  He is a little bit more awake and oriented.  But still decondition without any strength and in bed with weeping wounds.  He told me that he does not want to go to a nursing home.  He does not want to live at this moment.  He wants to go back to his home living alone.  He says he has an aide who  can manage.  -Palliative care consult: Does not think he has insight/capacity -. Goals of care in progess  - startted on lactulose for high ammonia level 10/11/2021   -Confusion and agitation intermittently.  Was on Precedex and helped him but because of bradycardia heart rate in the 40s..  Blood pressure did tolerate Precedex.  Has pulled out NG tube x2.  Currently not on any form of nutrition.  Refusing care on variable locations.  Lungs sounded congested and Lasix x1 given. new hydradenitis related right armpit also discovered 1/3: Awake, remains off pressors.  Bloody oral secretions noted.  Getting vitamin K for mild coagulopathy.  Blood may be secondary to reinsertion of feeding tube. Tetracyline started for Hidradenitis supporativa, SUBJECTIVE/OVERNIGHT/INTERVAL HX   Restless and agitated  Objective   Blood Pressure 107/65 (BP Location: Left Arm)    Pulse 81    Temperature 98.4 F (36.9 C) (Oral)    Respiration 16    Height 6' 3"  (1.905 m)    Weight 111 kg    Oxygen Saturation 96%    Body Mass Index 30.59 kg/m     Filed Weights   10/10/21 0402 10/11/21 0627 10/12/21 0600  Weight: 111.3 kg 110.1 kg 111 kg    Intake/Output Summary (Last 24 hours) at 10/12/2021 0815 Last data filed at 10/12/2021 0600 Gross per 24 hour  Intake 1293.48 ml  Output 1725 ml  Net -431.52 ml    General: 36 year old chronically ill-appearing male patient he is uncooperative today, agitated at times, remains on Precedex HEENT nasogastric tube in place small bore.  Bloody secretions from mouth.  I cannot get him to open his mouth to evaluate his posterior pharynx Pulmonary: Coarse scattered rhonchi diffuse pulse oximetry mid 90s on room air no current accessory use Cardiac regular rate and rhythm Abdomen soft not tender Extremities warm, diffuse anasarca, ulcerations noted Neuro awake, moving all extremities, not talking.  Agitated, restless, uncooperative.   GU: Clear yellow Foley catheter   Resolved  Hospital Problem list   Transaimitis - at admit -resolved 10/09/2021 Hypoxia resolved Septic shock secondary to urinary tract source resolved Assessment & Plan:   Septic shock (POA)- Ecoli UTI  v [place] ulcers (? Hidranentis)  10/11/2021 -on midodrine and hydrocortisone stress dose x 48-72h.  Off pressors >48hrs Plan Cont hydrocortisone taper Cont Midodrine  SBP goal > 100 Continue midodrine Keep foley in place until awake and able to cooperate w/ voiding trial  Ctx stop date 1/6  Acute on chronic metabolic Encephalopathy complicated by: Bipolar disease, schizoaffective disorder, ETOH abuse, chronic pain, opioid dependence and Post-traumatic stress.  Plan No haldol (QTC issue)  Cont Lexapro and Latuda  Off xanax since 12/31  Cont lactulose  F/u w/ [name] (send dc summ to him) Need to reevaluate prior to discharge about his ability to make his own medical decisions  Titrate precedex  epistaxis with ongoing bloody oral secretions as well High risk for aspiration -Wonder if blood is due to  feeding tube reinsertion Plan Continue pulse oximetry N.p.o. Will administer vitamin K, has mild coagulopathy  Cirrhosis + with mild ascites in context of chronic ETOH abuse, chronic elevated ammonia and fatty liver (POA)  Plan Supportive care Cont lactulose   Adrenal insufficiency  - mentioned nov 2022 notes Prior to & Present on Admit. 10/08/2021: Random cortisol 14 Plan Cont slow taper of Hydrocortisone   Chronic Prolonged QTc - > 563mec in Nov 2022 - Prior to & Present on Admit. On admit 10/06/2021 - - worse QTc 6066mc  - multiple CNS Meds and low Mg -Haldol stopped 10/08/2021 10/11/2021: QTc finally normal at 467 ms without Haldol Plan Frequent intermittent QTC check  well controlled HIV (on biktarvy; followed by [facility]; cd4 1100 this admission, hiv vl undetectable 06/2021) Plan Cont current HIV regimen    Intermittent Fluid & electrolyte  imbalance Plan Monitor    Known bicytopenia (hgb and  platelets) Severe acute on chronic thrombocytopenia this admit due to sepsis superimposed on ETOH -20s -status post 2 units PLTs and 3 units PRBCs this admit -PLTs stable in mid 40s (baseline 70s) Plan Cont to trend  SCDs Trend cbc Transfusion trigger < 7  Chronic dysphagia - Prior to & Present on Admit -D3 diet recommended November 2022-This admit worse and even failed D3 diet Plan tubefeeds  Severe physical deconditioing with severe fraility. FTT and severe Protein calorie malnutrition  Prior to & Present on Admit -SNF recommended in past but pt refuses  -seen by palliative  Plan Supportive care Maximize nutrition On-going [place]/ family  Open wound Right upper arm pit concerning for Hidradenitis supporativa, also has multiple areas of scarring from previous ulcerations  Plan Xeroform gauze dressings Tetracycline  50063mid x 3 mo  Known Avascular necrosis - left hip - Prior to & Present on Admit  - worse on CT this admit 10/06/2021 Plan  Supportive care Pain management  Maximize nutrition   Best Practice (right click and "Reselect all SmartList Selections" daily)   Diet/type: tubefeeds DVT prophylaxis: SCD GI prophylaxis: PPI Lines: Central line Foley:  Yes, and it is still needed Code Status:  full code Last date of multidisciplinary goals of care discussion [1/2 per palliative. ]  My cct 35 minutes PetErick ColaceNP-BC LebKellyger # [phone] # [phone] no answer

## 2021-10-12 NOTE — Evaluation (Signed)
SLP Cancellation Note  Patient Details Name: [name] MRN: [id number] DOB: 1986-01-06   Cancelled treatment:       Reason Eval/Treat Not Completed: Other (comment) (per secure chat with RN, pt is refusing oral care and pulled out his IV today but has small bore feeding tube in place, will continue efforts)  Kathleen Lime, MS [place] Office [phone] Pager [phone]    [name] 10/12/2021, 9:08 AM

## 2021-10-12 NOTE — Progress Notes (Signed)
eLink Physician-Brief Progress Note Patient Name: [name] DOB: [birth year] MRN: [id number]   Date of Service  10/12/2021  HPI/Events of Note  Patient needs KUB review for NG tube placement, Mg++ 1.7  eICU Interventions  NG tube verified in the distal stomach, okay to use. Magnesium sulfate 2 gm iv bolus ordered.        [name] [name] 10/12/2021, 10:04 PM

## 2021-10-13 ENCOUNTER — Inpatient Hospital Stay (HOSPITAL_COMMUNITY): Payer: Medicare HMO

## 2021-10-13 ENCOUNTER — Inpatient Hospital Stay: Payer: Self-pay

## 2021-10-13 DIAGNOSIS — J189 Pneumonia, unspecified organism: Secondary | ICD-10-CM

## 2021-10-13 DIAGNOSIS — R6521 Severe sepsis with septic shock: Secondary | ICD-10-CM | POA: Diagnosis not present

## 2021-10-13 DIAGNOSIS — E43 Unspecified severe protein-calorie malnutrition: Secondary | ICD-10-CM | POA: Diagnosis not present

## 2021-10-13 DIAGNOSIS — Z515 Encounter for palliative care: Secondary | ICD-10-CM | POA: Diagnosis not present

## 2021-10-13 DIAGNOSIS — A419 Sepsis, unspecified organism: Secondary | ICD-10-CM | POA: Diagnosis not present

## 2021-10-13 LAB — PHOSPHORUS
Phosphorus: 2.2 mg/dL — ABNORMAL LOW (ref 2.5–4.6)
Phosphorus: 2.4 mg/dL — ABNORMAL LOW (ref 2.5–4.6)

## 2021-10-13 LAB — COMPREHENSIVE METABOLIC PANEL
ALT: 42 U/L (ref 0–44)
AST: 43 U/L — ABNORMAL HIGH (ref 15–41)
Albumin: <1.5 g/dL — ABNORMAL LOW (ref 3.5–5.0)
Alkaline Phosphatase: 332 U/L — ABNORMAL HIGH (ref 38–126)
Anion gap: 6 (ref 5–15)
BUN: 31 mg/dL — ABNORMAL HIGH (ref 6–20)
CO2: 26 mmol/L (ref 22–32)
Calcium: 8.5 mg/dL — ABNORMAL LOW (ref 8.9–10.3)
Chloride: 112 mmol/L — ABNORMAL HIGH (ref 98–111)
Creatinine, Ser: 1.02 mg/dL (ref 0.61–1.24)
GFR, Estimated: >60 mL/min (ref >60)
Glucose, Bld: 164 mg/dL — ABNORMAL HIGH (ref 70–99)
Potassium: 3.9 mmol/L (ref 3.5–5.1)
Sodium: 144 mmol/L (ref 135–145)
Total Bilirubin: 0.9 mg/dL (ref 0.3–1.2)
Total Protein: 6.5 g/dL (ref 6.5–8.1)

## 2021-10-13 LAB — CBC
HCT: 23.8 % — ABNORMAL LOW (ref 39.0–52.0)
Hemoglobin: 7 g/dL — ABNORMAL LOW (ref 13.0–17.0)
MCH: 31.4 pg (ref 26.0–34.0)
MCHC: 29.4 g/dL — ABNORMAL LOW (ref 30.0–36.0)
MCV: 106.7 fL — ABNORMAL HIGH (ref 80.0–100.0)
Platelets: 55 10*3/uL — ABNORMAL LOW (ref 150–400)
RBC: 2.23 MIL/uL — ABNORMAL LOW (ref 4.22–5.81)
RDW: 17.1 % — ABNORMAL HIGH (ref 11.5–15.5)
WBC: 10.7 10*3/uL — ABNORMAL HIGH (ref 4.0–10.5)
nRBC: 0.2 % (ref 0.0–0.2)

## 2021-10-13 LAB — MAGNESIUM
Magnesium: 2.6 mg/dL — ABNORMAL HIGH (ref 1.7–2.4)
Magnesium: 2.1 mg/dL (ref 1.7–2.4)

## 2021-10-13 LAB — GLUCOSE, CAPILLARY
Glucose-Capillary: 106 mg/dL — ABNORMAL HIGH (ref 70–99)
Glucose-Capillary: 108 mg/dL — ABNORMAL HIGH (ref 70–99)
Glucose-Capillary: 123 mg/dL — ABNORMAL HIGH (ref 70–99)
Glucose-Capillary: 124 mg/dL — ABNORMAL HIGH (ref 70–99)
Glucose-Capillary: 165 mg/dL — ABNORMAL HIGH (ref 70–99)
Glucose-Capillary: 95 mg/dL (ref 70–99)

## 2021-10-13 MED ORDER — PANTOPRAZOLE SODIUM 40 MG PO TBEC
40.0000 mg | DELAYED_RELEASE_TABLET | Freq: Every day | ORAL | Status: DC
  Administered 2021-10-13: 40 mg via ORAL
  Filled 2021-10-13: qty 1

## 2021-10-13 MED ORDER — ALPRAZOLAM 1 MG PO TABS: 1.0000 mg | ORAL_TABLET | Freq: Three times a day (TID) | ORAL | Status: DC

## 2021-10-13 MED ORDER — LIP MEDEX EX OINT: TOPICAL_OINTMENT | CUTANEOUS | Status: DC | PRN

## 2021-10-13 MED ORDER — LACTULOSE 10 GM/15ML PO SOLN
10.0000 g | Freq: Three times a day (TID) | ORAL | Status: DC
  Administered 2021-10-13 – 2021-10-27 (×11): 10 g via ORAL
  Filled 2021-10-13 (×25): qty 15

## 2021-10-13 MED ORDER — HYDROCORTISONE SOD SUC (PF) 100 MG IJ SOLR
25.0000 mg | Freq: Every day | INTRAMUSCULAR | Status: DC
  Administered 2021-10-13: 25 mg via INTRAVENOUS
  Filled 2021-10-13: qty 2

## 2021-10-13 MED ORDER — POTASSIUM CHLORIDE 20 MEQ PO PACK
40.0000 meq | PACK | Freq: Every day | ORAL | Status: DC
  Administered 2021-10-14 – 2021-10-22 (×9): 40 meq via ORAL
  Filled 2021-10-13 (×12): qty 2

## 2021-10-13 MED ORDER — POLYETHYLENE GLYCOL 3350 17 G PO PACK: 17.0000 g | PACK | Freq: Every day | ORAL | Status: DC | PRN

## 2021-10-13 MED ORDER — TETRACYCLINE HCL 250 MG PO CAPS
500.0000 mg | ORAL_CAPSULE | Freq: Two times a day (BID) | ORAL | Status: DC
  Administered 2021-10-13 – 2021-11-04 (×41): 500 mg via ORAL
  Filled 2021-10-13 (×47): qty 2

## 2021-10-13 MED ORDER — ASCORBIC ACID 500 MG PO TABS
500.0000 mg | ORAL_TABLET | Freq: Two times a day (BID) | ORAL | Status: DC
  Administered 2021-10-13 – 2021-11-04 (×40): 500 mg via ORAL
  Filled 2021-10-13 (×45): qty 1

## 2021-10-13 MED ORDER — ZINC SULFATE 220 (50 ZN) MG PO CAPS
220.0000 mg | ORAL_CAPSULE | Freq: Every day | ORAL | Status: DC
  Administered 2021-10-14 – 2021-11-04 (×20): 220 mg via ORAL
  Filled 2021-10-13 (×22): qty 1

## 2021-10-13 MED ORDER — MIDODRINE HCL 5 MG PO TABS
5.0000 mg | ORAL_TABLET | Freq: Three times a day (TID) | ORAL | Status: DC
  Administered 2021-10-13 – 2021-10-24 (×26): 5 mg via ORAL
  Filled 2021-10-13 (×33): qty 1

## 2021-10-13 MED ORDER — THIAMINE HCL 100 MG PO TABS
250.0000 mg | ORAL_TABLET | Freq: Every day | ORAL | Status: DC
  Administered 2021-10-14 – 2021-10-17 (×3): 250 mg via ORAL
  Filled 2021-10-13 (×3): qty 3

## 2021-10-13 MED ORDER — VALACYCLOVIR HCL 500 MG PO TABS
1000.0000 mg | ORAL_TABLET | Freq: Every day | ORAL | Status: DC
  Administered 2021-10-14 – 2021-11-04 (×20): 1000 mg via ORAL
  Filled 2021-10-13 (×22): qty 2

## 2021-10-13 MED ORDER — ESCITALOPRAM OXALATE 20 MG PO TABS
20.0000 mg | ORAL_TABLET | Freq: Every day | ORAL | Status: DC
  Administered 2021-10-14 – 2021-11-04 (×21): 20 mg via ORAL
  Filled 2021-10-13: qty 2
  Filled 2021-10-13 (×21): qty 1
  Filled 2021-10-13: qty 2

## 2021-10-13 MED ORDER — LURASIDONE HCL 40 MG PO TABS
120.0000 mg | ORAL_TABLET | Freq: Every day | ORAL | Status: DC
  Administered 2021-10-14 – 2021-10-17 (×4): 120 mg via ORAL
  Filled 2021-10-13 (×5): qty 3

## 2021-10-13 MED ORDER — ADULT MULTIVITAMIN W/MINERALS CH
1.0000 | ORAL_TABLET | Freq: Every day | ORAL | Status: DC
  Administered 2021-10-16 – 2021-11-04 (×19): 1 via ORAL
  Filled 2021-10-13 (×20): qty 1

## 2021-10-13 MED ORDER — DOCUSATE SODIUM 100 MG PO CAPS
100.0000 mg | ORAL_CAPSULE | Freq: Two times a day (BID) | ORAL | Status: DC | PRN
  Administered 2021-11-04: 100 mg via ORAL
  Filled 2021-10-13: qty 1

## 2021-10-13 MED ORDER — ALPRAZOLAM 1 MG PO TABS
2.0000 mg | ORAL_TABLET | Freq: Three times a day (TID) | ORAL | Status: DC
  Administered 2021-10-13 – 2021-10-18 (×13): 2 mg via ORAL
  Filled 2021-10-13 (×13): qty 2

## 2021-10-13 MED ORDER — FENTANYL CITRATE (PF) 100 MCG/2ML IJ SOLN: 50.0000 ug | INTRAMUSCULAR | Status: DC | PRN

## 2021-10-13 MED ORDER — FOLIC ACID 1 MG PO TABS
1.0000 mg | ORAL_TABLET | Freq: Every day | ORAL | Status: DC
  Administered 2021-10-16 – 2021-11-04 (×19): 1 mg via ORAL
  Filled 2021-10-13 (×21): qty 1

## 2021-10-13 NOTE — Progress Notes (Signed)
Daily Progress Note   Patient Name: [name]       Date: 10/13/2021 DOB: Nov 15, 1985  Age: 36 y.o. MRN#: [id number] Attending Physician: Drew Clam, MD Primary Care Physician: Drew Ebbs, MD Admit Date: 10/06/2021  Reason for Consultation/Follow-up: Establishing goals of care  Subjective:  [name] is resting in bed, chart reviewed, overnight events noted, discussed with bedside RN, call placed and discussed with his mother [name] [phone].     Length of Stay: 6  Current Medications: Scheduled Meds:   ALPRAZolam  2 mg Oral TID   vitamin C  500 mg Oral BID   bictegravir-emtricitabine-tenofovir AF  1 tablet Oral Daily   chlorhexidine  15 mL Mouth Rinse BID   Chlorhexidine Gluconate Cloth  6 each Topical Q0600   [START ON 10/14/2021] escitalopram  20 mg Oral Daily   feeding supplement (PIVOT 1.5 CAL)  1,000 mL Per Tube Q24H   [START ON 04/14/4491] folic acid  1 mg Oral Daily   free water  100 mL Per Tube Q4H   hydrocortisone sod succinate (SOLU-CORTEF) inj  25 mg Intravenous Daily   ipratropium-albuterol  3 mL Nebulization BID   lactulose  10 g Oral TID   [START ON 10/14/2021] lurasidone  120 mg Oral Daily   mouth rinse  15 mL Mouth Rinse q12n4p   midodrine  5 mg Oral TID WC   [START ON 10/14/2021] multivitamin with minerals  1 tablet Oral Daily   mupirocin ointment   Nasal BID   nicotine  14 mg Transdermal Daily   pantoprazole  40 mg Oral Daily   [START ON 10/14/2021] potassium chloride  40 mEq Oral Daily   tetracycline  500 mg Oral BID   thiamine injection  100 mg Intravenous Daily   [START ON 10/14/2021] thiamine  250 mg Oral Daily   [START ON 10/14/2021] valACYclovir  1,000 mg Oral Daily   [START ON 10/14/2021] zinc sulfate  220 mg Oral Daily    Continuous  Infusions:  sodium chloride Stopped (10/13/21 1134)   sodium chloride Stopped (10/12/21 0936)   cefTRIAXone (ROCEPHIN)  IV Stopped (10/12/21 2127)   dexmedetomidine (PRECEDEX) IV infusion Stopped (10/13/21 1134)   dextrose 5 % and 0.9% NaCl Stopped (10/11/21 1232)   famotidine (PEPCID) IV Stopped (10/13/21 1054)  PRN Meds: albuterol, docusate sodium, food thickener, lip balm, polyethylene glycol  Physical Exam         General: Sleeping, in no acute distress.   HEENT: No bruits, no goiter, no JVD Heart: Regular rate and rhythm. No murmur appreciated. Lungs: Good air movement, clear Abdomen: Soft, nontender, nondistended, positive bowel sounds.   Ext: No significant edema Skin: Warm and dry  Vital Signs: BP (!) 93/56    Pulse 85    Temp 98.2 F (36.8 C) (Oral)    Resp 10    Ht 6' 3"  (1.905 m)    Wt 110.4 kg    SpO2 94%    BMI 30.42 kg/m  SpO2: SpO2: 94 % O2 Device: O2 Device: Room Air O2 Flow Rate: O2 Flow Rate (L/min): 2 L/min  Intake/output summary:  Intake/Output Summary (Last 24 hours) at 10/13/2021 1232 Last data filed at 10/13/2021 1200 Gross per 24 hour  Intake 3643.75 ml  Output 1925 ml  Net 1718.75 ml    LBM: Last BM Date: 10/12/21 Baseline Weight: Weight: 108.9 kg Most recent weight: Weight: 110.4 kg       Palliative Assessment/Data:    Flowsheet Rows    Flowsheet Row Most Recent Value  Intake Tab   Referral Department Critical care  Unit at Time of Referral ICU  Palliative Care Primary Diagnosis Sepsis/Infectious Disease  Date Notified 10/09/21  Palliative Care Type New Palliative care  Reason for referral Clarify Goals of Care  Date of Admission 10/06/21  Date first seen by Palliative Care 10/10/21  # of days Palliative referral response time 1 Day(s)  # of days IP prior to Palliative referral 3  Clinical Assessment   Palliative Performance Scale Score 30%  Psychosocial & Spiritual Assessment   Palliative Care Outcomes   Patient/Family meeting  held? Yes  Who was at the meeting? Patient, mother via phone       Patient Active Problem List   Diagnosis Date Noted   Protein-calorie malnutrition, severe ([name]) 10/09/2021   Malnutrition of moderate degree 10/09/2021   HIV positive ([name]) 10/07/2021   Avascular necrosis of femoral head, left ([name]) 10/07/2021   Hepatic steatosis 10/07/2021   Cellulitis of multiple sites of buttock 10/07/2021   Hypotension    Hypothermia 08/24/2021   Hypoxia 08/24/2021   Septic shock ([name]) 08/23/2021   Left hip pain    Transaminitis    Acute metabolic encephalopathy 42/59/5638   Cellulitis 05/07/2021   Lymphadenopathy 09/29/2020   Anemia    Upper urinary tract infection    Sepsis secondary to UTI ([name]) 03/14/2020   Cellulitis of groin 03/14/2020   Elevated LFTs 03/14/2020   HIV disease ([name])    Acute encephalopathy    Hyponatremia    Macrocytic anemia    Thrombocytopenia ([name])    Prolonged QT interval    Acute respiratory failure due to COVID-19 ([name]) 10/27/2019   GERD (gastroesophageal reflux disease) 10/27/2019   Hypokalemia 10/27/2019   Peripheral neuropathy 10/01/2019   PTSD (post-traumatic stress disorder) 07/23/2018   Dizziness and giddiness 02/01/2016   Migraine headache 02/01/2016   Schizoaffective disorder, depressive type ([name]) 05/13/2015   Severe alcohol dependence ([name]) 05/13/2015   Suicidal ideation 01/12/2014    Palliative Care Assessment & Plan   Patient Profile: 36 y.o. male  with past medical history of HIV, bipolar, depression, hypertension, migraine, schizoaffective disorder, seizures admitted on 10/06/2021 with hypothermia and concern for infected wounds.  At time of admission, he was started on Zosyn and vancomycin.  Labs revealed hypokalemia and elevated lactate.  Fluid resuscitation started.  He is remained very weak and continues to have significant dysphagia.  NG tube was placed to dysphagia.  Palliative consulted for goals of  care.  Recommendations/Plan: Full code/full scope: re discussed with his mother on the phone today, she says, "its a bit much" with regards to the patient's current condition and guarded prognosis, she is aware of things he is up against. We talked about chronic malnutrition as well as acute factors such as infection and multi factorial encephalopathy. Brief life review also performed, patient was living independently in an apartment, he has no spouse no kids.  Continue current mode of care for now, agree with SLP eval and hopefully patient can get started on PO.    Goals of Care and Additional Recommendations: Limitations on Scope of Treatment: Full Scope Treatment  Code Status:    Code Status Orders  (From admission, onward)           Start     Ordered   10/07/21 0225  Full code  Continuous        10/07/21 0228           Code Status History     Date Active Date Inactive Code Status Order ID Comments User Context   08/24/2021 0200 09/01/2021 0258 Full Code [id number]  [name], MD ED   05/07/2021 1912 05/19/2021 1841 Full Code [id number]  [name], MD ED   03/15/2020 0606 03/20/2020 1650 Full Code [id number]  [name], MD ED   10/27/2019 1815 11/02/2019 1902 Full Code [id number]  [name], MD Inpatient   07/22/2018 1722 07/22/2018 2158 Full Code [id number]  [name], NP Inpatient   07/22/2018 0832 07/22/2018 1703 Full Code [id number]  [name], MD ED   05/12/2015 1753 05/15/2015 1723 Full Code [id number]  [name], NP Inpatient   05/12/2015 0546 05/12/2015 1753 Full Code [id number]  [name], MD ED   01/12/2014 0256 01/12/2014 1650 Full Code [id number]  [name], MD ED       Prognosis:  Unable to determine  Discharge Planning: To Be Determined  Care plan was discussed with mother on phone.   Thank you for allowing the Palliative Medicine Team to assist in the care of this patient.   Time In: 12 Time Out: 12.25  Total Time 25 Prolonged Time Billed No   Loistine Chance, MD  Please contact Palliative Medicine Team phone at [phone] for questions and concerns.

## 2021-10-13 NOTE — Progress Notes (Signed)
VAST consulted to obtain IV access so that CVC can be dc'd.  Pt's left arm is extremely edematous and not appropriate for IV placement. Pt's right lower arm slightly edematous. Attempted to place upper arm USGIV unsuccessfully. ICU RN notified of findings.

## 2021-10-13 NOTE — Progress Notes (Signed)
NAME:  [name] MRN:  [id number] DOB:  [birth year] LOS: 6 ADMISSION DATE:  10/06/2021 DATE OF SERVICE:  10/07/2021  CHIEF COMPLAINT: Sore on buttocks  HISTORY & PHYSICAL  BRIEF  This 36 y.o. African-American male, HIV positive smoker presented to the [facility] Emergency Department via EMS with complaints of sore on his buttocks.  He apparently had a visit from the home health service earlier today, who raised concerns that the sores may have become infected and advised him to present to the emergency department.  The patient reported that he does endorse fatigue, more than normal.  He was noted to be hypothermic (92.5 F) on arrival.  He was started on empiric Zosyn/vancomycin.  Initial labs revealed hypokalemia (2.7) and elevated lactate (6.4).  He was also noted to have abnormal LFTs with a cholestatic pattern.  Bear hugger was placed.  IV fluid resuscitation was started.  BP was noted to trend downward.  Norepinephrine infusion started.    Past Medical/Surgical/Social/Family History   has a past medical history of Bipolar 1 disorder ([place]), Depression, Dizziness and giddiness (02/01/2016), Herpes genitalia, HIV disease ([place]), Hypertension, Migraine headache (02/01/2016), Peripheral neuropathy (10/01/2019), PTSD (post-traumatic stress disorder), Schizoaffective disorder ([place]), and Seizures ([place]).   has a past surgical history that includes Hand surgery; Back surgery; Colonoscopy with propofol ([name], 02/26/2021); Esophagogastroduodenoscopy (egd) with propofol ([name], 02/26/2021); biopsy (02/26/2021); and Esophagogastroduodenoscopy (egd) with propofol ([name], 05/18/2021). Known positive syphilis serology - Prior to & Present on Admit  Septic shock admt - nov 2022 - urinary source (gas on CT) versus lowr torsor hidradeitis v cellulits - most likely e colii UTI  Procedures:  CVC (12/29)    EVENTS   10/07/21 -on 2 L nasal cannula.  He is vasopressin and Levophed.  Also on  fluid lactated Ringer.  He did get transfused platelets and RBC.  His temperature was low with a white count of 11 point 4K.  His MRSA PCR was positive.  Rest of cultures pending.  Duplex lower extremity negative for DVT.  Admit QTC was 600 ms.  Improved later in the day to 480 ms. - Psych consult 10/08/21 recommended discontinuing Haldol, Topamax and trazodone permanently 10/08/21 -he is off vasopressin.  Levophed is down to 3 mcg/min.  QTC has improved to 480 ms after his Haldol was stopped.  He had gas in his urinary system.  Urology recommended Foley catheter but he refused.  - Psych consult - [name]. Stop haldol.  Stop trazodone..  Stop Topamax.  Continue Xanax 2 mg 3 times daily, Latuda and Lexapro.  Follow-up with Dr. Acquanetta Chain outpatient psychiatry  - failed even D3 diet due to dysphagia. Severe aspiration  - refused foley   10/09/21 -remains on intubated.  Possible hypothermia.  WBC count improved to 12,200.  On 2 L nasal cannula.  Off vasopressin.  Appears to be on low-dose Levophed.  Because nursing concern is that he seems to have overflow incontinence.  He is refused Foley and nursing also has been unable to get Foley when he agreed to it.  If 100 cc bladder retention this morning.  Urine cultures growing E. coli.  He also appears to be with continued sleepiness although when aroused he is able to answer questions with a soft voice  -Foley catheter placed by urology Dr. Diona Fanti  -Tube feeds started [n.p.o. due to severe aspiration]  -Palliative care consult called  10/10/21 - currently on room air. [name]  He is on  tube feeds.  He is on Foley catheter he is afebrile.  He is off pressors at this point.  He is a little bit more awake and oriented.  But still decondition without any strength and in bed with weeping wounds.  He told me that he does not want to go to a nursing home.  He does not want to live at this moment.  He wants to go back to his home living alone.  He says he has an aide who  can manage.  -Palliative care consult: Does not think he has insight/capacity -. Goals of care in progess  - startted on lactulose for high ammonia level 10/11/2021   -Confusion and agitation intermittently.  Was on Precedex and helped him but because of bradycardia heart rate in the 40s..  Blood pressure did tolerate Precedex.  Has pulled out NG tube x2.  Currently not on any form of nutrition.  Refusing care on variable locations.  Lungs sounded congested and Lasix x1 given. new hydradenitis related right armpit also discovered 1/3: Awake, remains off pressors.  Bloody oral secretions noted.  Getting vitamin K for mild coagulopathy.  Blood may be secondary to reinsertion of feeding tube. Tetracyline started for Hidradenitis supporativa, 1/4 pulled out feeding tube multiple times. CXR suggesting aspiration. Allowing sips w/meds and re-ordered SLP eval. Added back xanax. Got 1 time dose of zyprexa IM.  SUBJECTIVE/OVERNIGHT/INTERVAL HX   Agitated again. Got zyprexa  x 1  Objective   Blood Pressure 120/74 (BP Location: Left Arm)    Pulse 73    Temperature 97.6 F (36.4 C) (Oral)    Respiration 14    Height 6' 3"  (1.905 m)    Weight 110.4 kg    Oxygen Saturation 99%    Body Mass Index 30.42 kg/m     Filed Weights   10/11/21 0627 10/12/21 0600 10/13/21 0414  Weight: 110.1 kg 111 kg 110.4 kg    Intake/Output Summary (Last 24 hours) at 10/13/2021 0857 Last data filed at 10/13/2021 0700 Gross per 24 hour  Intake 3614.87 ml  Output 1675 ml  Net 1939.87 ml   General this is a 36 year old male. He appears chronically ill and much older than stated age. Pulled his feeding tube out again just before I walked into the room HENT [place]. Lips bloody,dry and ulcerated. No JVD Pulm Room air. Diffuse rhonchi. No accessory use.  PCXR worsening bilateral airspace disease Card RRR w/out MRG Abd soft  Ext diffuse anasarca ulcerations noted. Right armpit dressing intact Neuro more awake today. Oriented X2. Very  impulsive. Moves all extremities. Combative at times.    Resolved Hospital Problem list   Transaimitis - at admit -resolved 10/09/2021 Hypoxia resolved Septic shock (POA) secondary to [facility] urinary tract +/- lower torso ulcerations from Hidranetis source resolved Assessment & Plan:   Ecoli UTI Plan Cont ceftriaxone thru 1/6 Keeping foley cath in place given difficult insertion. Can consider removal once more ambulatory and cooperative   Relative adrenal insufficiency and chronic Hypotension Plan  Cont to taper stress dose steroids (decreased to 94m/d; will dc tomorrow.  Cont midodrine (changed to 516mtid; may need to go back up to 1016m Epistaxis w/ resultant aspiration Pneumonitis  -suspect bleeding from repeated NGT insertions in context of thrombocytopenia and coagulopathy  Plan Cont to monitor pulse oz and clinical WOB Watch fever and WBC ct Repeat am cxr If clinically declines may need to alter abx approach   Acute on Chronic dysphagia -  Prior to & Present on Admit -D3 diet recommended November 2022-This admit worse and even failed D3 diet. He continues to pull out feeding tubes even when heavily sedated. He has proven that this has also created opportunity for aspiration  Plan Will hold off on further NGT placement Re-engage SLP For now sips OK (when monitored by bedside nurse using 1 sip at time an chin tuck) Meds w/ apple sauce  Acute on chronic metabolic Encephalopathy complicated by: Bipolar disease, schizoaffective disorder, ETOH abuse, chronic pain, opioid dependence and Post-traumatic stress.  -still very difficult to control  Plan Holding further antipsychotics given QTc issues Cont Lexapro and Latuda  Resume xanax today  Cont lactulose F/u w/ [name] w/ plan to send DC summary to him Cont to titrate precedex   Cirrhosis + with mild ascites in context of chronic ETOH abuse, chronic elevated ammonia and fatty liver (POA)  Plan Supportive  care lactulose   Chronic Prolonged QTc - > 529mec in Nov 2022 - Prior to & Present on Admit. On admit 10/06/2021 - - worse QTc 6040mc  - multiple CNS Meds and low Mg -Haldol stopped 10/08/2021 10/11/2021: QTc finally normal at 467 ms without Haldol Plan Repeat 12 lead am 1/5  well controlled HIV (on biktarvy; followed by [facility]; cd4 1100 this admission, hiv vl undetectable 06/2021) Plan Cont current HIV regimen    Intermittent Fluid & electrolyte imbalance Plan Monitor and replace as needed   Known bicytopenia (hgb and  platelets) Severe acute on chronic thrombocytopenia this admit due to sepsis superimposed on ETOH -20s -status post 2 units PLTs and 3 units PRBCs this admit -PLTs stable in mid 40s (baseline 70s), HGB trending down  Plan Cont SCDs (w/ dropping Hgb will not add back prophylactic DVT treatment) Trend cbc Will likely need transfusion again in next 24 hrs  Mild coagulopathy ->suspect d/t CLD Plan Repeat INR am   Severe physical deconditioing with severe fraility. FTT and severe Protein calorie malnutrition  Prior to & Present on Admit -SNF recommended in past but pt refuses  -seen by palliative  Plan Maximizing nutrition  Needs SNF  Open wound Right upper arm pit concerning for Hidradenitis supporativa, also has multiple areas of scarring from previous ulcerations  Plan Xeroform gauze  Tetracycline 5007mo X 3 mo (started 1/3)   Known Avascular necrosis - left hip - Prior to & Present on Admit  - worse on CT this admit 10/06/2021 Plan  Supportive care Pain management    Best Practice (right click and "Reselect all SmartList Selections" daily)   Diet/type: NPO w/ oral meds DVT prophylaxis: SCD GI prophylaxis: PPI Lines: Central line Foley:  Yes, and it is still needed Code Status:  full code Last date of multidisciplinary goals of care discussion [1/2 per palliative. ]  My cct 34 min   PetErick ColaceNP-BC [facility]ager # [phone] # [phone] no answer

## 2021-10-13 NOTE — Plan of Care (Signed)
Patient remains agitated and resistant to nursing care. Patient repeatedly expresses that he wants to go, let him out, and curses frequently. Unable to titrate Precedex due to bradycardia. Administered Zyprexa 54m IM x 1 dose ordered by Dr. OLucile Shuttersfor continuous agitation. Patient is unable to comprehend care needs or rationale for interventions. Patient now has restraints in both wrists and ankles. Patient wearing mask over mouth so he cannot spit on staff. Able to maintain patency of NG tube thus far. Patient's temperature remains low, but patient not keeping blankets on him. Increased room temperature to compensate. Not low enough for bair hugger device.   Problem: Education: Goal: Knowledge of General Education information will improve Description: Including pain rating scale, medication(s)/side effects and non-pharmacologic comfort measures Outcome: Not Progressing   Problem: Health Behavior/Discharge Planning: Goal: Ability to manage health-related needs will improve Outcome: Not Progressing   Problem: Clinical Measurements: Goal: Ability to maintain clinical measurements within normal limits will improve Outcome: Not Progressing Goal: Will remain free from infection Outcome: Not Progressing Goal: Diagnostic test results will improve Outcome: Not Progressing Goal: Respiratory complications will improve Outcome: Not Progressing Goal: Cardiovascular complication will be avoided Outcome: Not Progressing   Problem: Activity: Goal: Risk for activity intolerance will decrease Outcome: Not Progressing   Problem: Nutrition: Goal: Adequate nutrition will be maintained Outcome: Not Progressing   Problem: Coping: Goal: Level of anxiety will decrease Outcome: Not Progressing   Problem: Elimination: Goal: Will not experience complications related to bowel motility Outcome: Not Progressing Goal: Will not experience complications related to urinary retention Outcome: Not Progressing    Problem: Pain Managment: Goal: General experience of comfort will improve Outcome: Not Progressing   Problem: Safety: Goal: Ability to remain free from injury will improve Outcome: Not Progressing   Problem: Skin Integrity: Goal: Risk for impaired skin integrity will decrease Outcome: Not Progressing   Problem: Fluid Volume: Goal: Hemodynamic stability will improve Outcome: Not Progressing   Problem: Clinical Measurements: Goal: Diagnostic test results will improve Outcome: Not Progressing Goal: Signs and symptoms of infection will decrease Outcome: Not Progressing   Problem: Respiratory: Goal: Ability to maintain adequate ventilation will improve Outcome: Not Progressing   Problem: Safety: Goal: Non-violent Restraint(s) Outcome: Not Progressing

## 2021-10-14 ENCOUNTER — Encounter (HOSPITAL_COMMUNITY): Payer: Self-pay | Admitting: Pulmonary Disease

## 2021-10-14 DIAGNOSIS — J690 Pneumonitis due to inhalation of food and vomit: Secondary | ICD-10-CM

## 2021-10-14 DIAGNOSIS — R1310 Dysphagia, unspecified: Secondary | ICD-10-CM

## 2021-10-14 DIAGNOSIS — Z66 Do not resuscitate: Secondary | ICD-10-CM

## 2021-10-14 DIAGNOSIS — R627 Adult failure to thrive: Secondary | ICD-10-CM

## 2021-10-14 DIAGNOSIS — R6521 Severe sepsis with septic shock: Secondary | ICD-10-CM | POA: Diagnosis not present

## 2021-10-14 DIAGNOSIS — A419 Sepsis, unspecified organism: Secondary | ICD-10-CM | POA: Diagnosis not present

## 2021-10-14 DIAGNOSIS — D689 Coagulation defect, unspecified: Secondary | ICD-10-CM

## 2021-10-14 LAB — HEMOGLOBIN AND HEMATOCRIT, BLOOD
HCT: 27.1 % — ABNORMAL LOW (ref 39.0–52.0)
Hemoglobin: 8.3 g/dL — ABNORMAL LOW (ref 13.0–17.0)

## 2021-10-14 LAB — COMPREHENSIVE METABOLIC PANEL
ALT: 40 U/L (ref 0–44)
AST: 36 U/L (ref 15–41)
Albumin: <1.5 g/dL — ABNORMAL LOW (ref 3.5–5.0)
Alkaline Phosphatase: 300 U/L — ABNORMAL HIGH (ref 38–126)
Anion gap: 4 — ABNORMAL LOW (ref 5–15)
BUN: 29 mg/dL — ABNORMAL HIGH (ref 6–20)
CO2: 28 mmol/L (ref 22–32)
Calcium: 8.5 mg/dL — ABNORMAL LOW (ref 8.9–10.3)
Chloride: 115 mmol/L — ABNORMAL HIGH (ref 98–111)
Creatinine, Ser: 1.03 mg/dL (ref 0.61–1.24)
GFR, Estimated: >60 mL/min (ref >60)
Glucose, Bld: 160 mg/dL — ABNORMAL HIGH (ref 70–99)
Potassium: 3.3 mmol/L — ABNORMAL LOW (ref 3.5–5.1)
Sodium: 147 mmol/L — ABNORMAL HIGH (ref 135–145)
Total Bilirubin: 0.8 mg/dL (ref 0.3–1.2)
Total Protein: 5.8 g/dL — ABNORMAL LOW (ref 6.5–8.1)

## 2021-10-14 LAB — CBC WITH DIFFERENTIAL/PLATELET
Abs Immature Granulocytes: 0.08 10*3/uL — ABNORMAL HIGH (ref 0.00–0.07)
Abs Immature Granulocytes: 0.14 10*3/uL — ABNORMAL HIGH (ref 0.00–0.07)
Basophils Absolute: 0 10*3/uL (ref 0.0–0.1)
Basophils Absolute: 0 10*3/uL (ref 0.0–0.1)
Basophils Relative: 0 %
Basophils Relative: 0 %
Eosinophils Absolute: 0.1 10*3/uL (ref 0.0–0.5)
Eosinophils Absolute: 0.1 10*3/uL (ref 0.0–0.5)
Eosinophils Relative: 1 %
Eosinophils Relative: 0 %
HCT: 23.1 % — ABNORMAL LOW (ref 39.0–52.0)
HCT: 19.2 % — ABNORMAL LOW (ref 39.0–52.0)
Hemoglobin: 7.4 g/dL — ABNORMAL LOW (ref 13.0–17.0)
Hemoglobin: 5.9 g/dL — CL (ref 13.0–17.0)
Immature Granulocytes: 1 %
Immature Granulocytes: 1 %
Lymphocytes Relative: 19 %
Lymphocytes Relative: 27 %
Lymphs Abs: 2.1 10*3/uL (ref 0.7–4.0)
Lymphs Abs: 3.2 10*3/uL (ref 0.7–4.0)
MCH: 32 pg (ref 26.0–34.0)
MCH: 32.4 pg (ref 26.0–34.0)
MCHC: 32 g/dL (ref 30.0–36.0)
MCHC: 30.7 g/dL (ref 30.0–36.0)
MCV: 100 fL (ref 80.0–100.0)
MCV: 105.5 fL — ABNORMAL HIGH (ref 80.0–100.0)
Monocytes Absolute: 0.4 10*3/uL (ref 0.1–1.0)
Monocytes Absolute: 0.9 10*3/uL (ref 0.1–1.0)
Monocytes Relative: 3 %
Monocytes Relative: 8 %
Neutro Abs: 8.8 10*3/uL — ABNORMAL HIGH (ref 1.7–7.7)
Neutro Abs: 7.4 10*3/uL (ref 1.7–7.7)
Neutrophils Relative %: 76 %
Neutrophils Relative %: 64 %
Platelets: 17 10*3/uL — CL (ref 150–400)
Platelets: 67 10*3/uL — ABNORMAL LOW (ref 150–400)
RBC: 2.31 MIL/uL — ABNORMAL LOW (ref 4.22–5.81)
RBC: 1.82 MIL/uL — ABNORMAL LOW (ref 4.22–5.81)
RDW: 18.2 % — ABNORMAL HIGH (ref 11.5–15.5)
RDW: 17 % — ABNORMAL HIGH (ref 11.5–15.5)
WBC: 11.5 10*3/uL — ABNORMAL HIGH (ref 4.0–10.5)
WBC: 11.7 10*3/uL — ABNORMAL HIGH (ref 4.0–10.5)
nRBC: 0 % (ref 0.0–0.2)
nRBC: 0 % (ref 0.0–0.2)

## 2021-10-14 LAB — GLUCOSE, CAPILLARY
Glucose-Capillary: 63 mg/dL — ABNORMAL LOW (ref 70–99)
Glucose-Capillary: 105 mg/dL — ABNORMAL HIGH (ref 70–99)
Glucose-Capillary: 81 mg/dL (ref 70–99)
Glucose-Capillary: 86 mg/dL (ref 70–99)
Glucose-Capillary: 83 mg/dL (ref 70–99)
Glucose-Capillary: 83 mg/dL (ref 70–99)
Glucose-Capillary: 85 mg/dL (ref 70–99)

## 2021-10-14 LAB — PHOSPHORUS: Phosphorus: 2.5 mg/dL (ref 2.5–4.6)

## 2021-10-14 LAB — PROTIME-INR
INR: 1.2 (ref 0.8–1.2)
Prothrombin Time: 15.4 seconds — ABNORMAL HIGH (ref 11.4–15.2)

## 2021-10-14 LAB — MAGNESIUM: Magnesium: 2.2 mg/dL (ref 1.7–2.4)

## 2021-10-14 LAB — PREPARE RBC (CROSSMATCH)

## 2021-10-14 MED ORDER — PROSOURCE PLUS PO LIQD
30.0000 mL | Freq: Four times a day (QID) | ORAL | Status: DC
  Administered 2021-10-15 – 2021-11-04 (×50): 30 mL via ORAL
  Filled 2021-10-14 (×55): qty 30

## 2021-10-14 MED ORDER — PANTOPRAZOLE SODIUM 40 MG PO TBEC
40.0000 mg | DELAYED_RELEASE_TABLET | Freq: Two times a day (BID) | ORAL | Status: DC
  Administered 2021-10-14 – 2021-10-28 (×23): 40 mg via ORAL
  Filled 2021-10-14 (×26): qty 1

## 2021-10-14 MED ORDER — DEXTROSE 10 % IV SOLN
INTRAVENOUS | Status: DC
  Administered 2021-10-14: 40 mL/h via INTRAVENOUS

## 2021-10-14 MED ORDER — DEXTROSE 50 % IV SOLN
INTRAVENOUS | Status: AC
  Administered 2021-10-14: 12.5 mL
  Filled 2021-10-14: qty 50

## 2021-10-14 MED ORDER — SODIUM CHLORIDE 0.9% IV SOLUTION: Freq: Once | INTRAVENOUS | Status: AC

## 2021-10-14 MED ORDER — POTASSIUM CHLORIDE 10 MEQ/50ML IV SOLN
10.0000 meq | INTRAVENOUS | Status: AC
  Administered 2021-10-14 (×6): 10 meq via INTRAVENOUS
  Filled 2021-10-14 (×5): qty 50

## 2021-10-14 MED ORDER — KCL IN DEXTROSE-NACL 40-5-0.45 MEQ/L-%-% IV SOLN
INTRAVENOUS | Status: DC
  Filled 2021-10-14 (×16): qty 1000

## 2021-10-14 NOTE — Progress Notes (Signed)
Patient was found sitting up in bed, calling out for his mother. He is requesting to be dressed because he is going home.   Patient was reoriented, mitts were placed on him and he was repositioned in bed.    Safety ensured, will continue to monitor.

## 2021-10-14 NOTE — Progress Notes (Signed)
MBS scheduled for 830 am 10/15/21. Contact me at [phone] if there is a change in plans.   [name], [name]  Acute Rehabilitation Services Office [phone]

## 2021-10-14 NOTE — Assessment & Plan Note (Signed)
From fatty liver and etoh Plan Nothing to do at this time

## 2021-10-14 NOTE — Assessment & Plan Note (Signed)
Made DNR 1/5 Mother hopeful that he will be candidate for hospice Allowing for comfort care No escalation of care beyond oxygen, IVFs and antibiotics. No BIPAP, no pressors Full DNR

## 2021-10-14 NOTE — Assessment & Plan Note (Signed)
Unable to keep feeding tube in place. Not candidate for Peg Plan Diet w/ precautions as able

## 2021-10-14 NOTE — Progress Notes (Signed)
NAME:  [name] MRN:  [id number] DOB:  [birth year] LOS: 7 ADMISSION DATE:  10/06/2021 DATE OF SERVICE:  10/07/2021  CHIEF COMPLAINT: Sore on buttocks  HISTORY & PHYSICAL  BRIEF  This 36 y.o. African-American male, HIV positive smoker presented to the [facility] Emergency Department via EMS with complaints of sore on his buttocks.  He apparently had a visit from the home health service earlier today, who raised concerns that the sores may have become infected and advised him to present to the emergency department.  The patient reported that he does endorse fatigue, more than normal.  He was noted to be hypothermic (92.5 F) on arrival.  He was started on empiric Zosyn/vancomycin.  Initial labs revealed hypokalemia (2.7) and elevated lactate (6.4).  He was also noted to have abnormal LFTs with a cholestatic pattern.  Bear hugger was placed.  IV fluid resuscitation was started.  BP was noted to trend downward.  Norepinephrine infusion started.    Past Medical/Surgical/Social/Family History   has a past medical history of Bipolar 1 disorder ([place]), Depression, Dizziness and giddiness (02/01/2016), Herpes genitalia, HIV disease ([place]), Hypertension, Migraine headache (02/01/2016), Peripheral neuropathy (10/01/2019), PTSD (post-traumatic stress disorder), Schizoaffective disorder ([place]), and Seizures ([place]).   has a past surgical history that includes Hand surgery; Back surgery; Colonoscopy with propofol ([name], 02/26/2021); Esophagogastroduodenoscopy (egd) with propofol ([name], 02/26/2021); biopsy (02/26/2021); and Esophagogastroduodenoscopy (egd) with propofol ([name], 05/18/2021). Known positive syphilis serology - Prior to & Present on Admit  Septic shock admt - nov 2022 - urinary source (gas on CT) versus lowr torsor hidradeitis v cellulits - most likely e colii UTI  Procedures:  CVC (12/29)    EVENTS   10/07/21 -on 2 L nasal cannula.  He is vasopressin and Levophed.  Also on  fluid lactated Ringer.  He did get transfused platelets and RBC.  His temperature was low with a white count of 11 point 4K.  His MRSA PCR was positive.  Rest of cultures pending.  Duplex lower extremity negative for DVT.  Admit QTC was 600 ms.  Improved later in the day to 480 ms. - Psych consult 10/08/21 recommended discontinuing Haldol, Topamax and trazodone permanently 10/08/21 -he is off vasopressin.  Levophed is down to 3 mcg/min.  QTC has improved to 480 ms after his Haldol was stopped.  He had gas in his urinary system.  Urology recommended Foley catheter but he refused.  - Psych consult - [name]. Stop haldol.  Stop trazodone..  Stop Topamax.  Continue Xanax 2 mg 3 times daily, Latuda and Lexapro.  Follow-up with Dr. Acquanetta Chain outpatient psychiatry  - failed even D3 diet due to dysphagia. Severe aspiration  - refused foley   10/09/21 -remains on intubated.  Possible hypothermia.  WBC count improved to 12,200.  On 2 L nasal cannula.  Off vasopressin.  Appears to be on low-dose Levophed.  Because nursing concern is that he seems to have overflow incontinence.  He is refused Foley and nursing also has been unable to get Foley when he agreed to it.  If 100 cc bladder retention this morning.  Urine cultures growing E. coli.  He also appears to be with continued sleepiness although when aroused he is able to answer questions with a soft voice  -Foley catheter placed by urology Dr. Diona Fanti  -Tube feeds started [n.p.o. due to severe aspiration]  -Palliative care consult called  10/10/21 - currently on room air. [name]  He is on  tube feeds.  He is on Foley catheter he is afebrile.  He is off pressors at this point.  He is a little bit more awake and oriented.  But still decondition without any strength and in bed with weeping wounds.  He told me that he does not want to go to a nursing home.  He does not want to live at this moment.  He wants to go back to his home living alone.  He says he has an aide who  can manage.  -Palliative care consult: Does not think he has insight/capacity -. Goals of care in progess  - startted on lactulose for high ammonia level 10/11/2021   -Confusion and agitation intermittently.  Was on Precedex and helped him but because of bradycardia heart rate in the 40s..  Blood pressure did tolerate Precedex.  Has pulled out NG tube x2.  Currently not on any form of nutrition.  Refusing care on variable locations.  Lungs sounded congested and Lasix x1 given. new hydradenitis related right armpit also discovered 1/3: Awake, remains off pressors.  Bloody oral secretions noted.  Getting vitamin K for mild coagulopathy.  Blood may be secondary to reinsertion of feeding tube. Tetracyline started for Hidradenitis supporativa, 1/4 pulled out feeding tube multiple times. CXR suggesting aspiration. Allowing sips w/meds and re-ordered SLP eval. Added back xanax. Got 1 time dose of zyprexa IM.  1/5 hgb down to 5.9. getting 2 units of blood. Mental status better. Off precedex. Taking orals w/ direct observation or RN. Added MIVFs as getting more hypernatremic. Order placed for PICC. Ready for triad. Not ready for dc   SUBJECTIVE/OVERNIGHT/INTERVAL HX   Calm today   Objective   Blood Pressure 100/65 (BP Location: Left Arm)    Pulse 90    Temperature 98.4 F (36.9 C) (Oral)    Respiration 17    Height 6' 3"  (1.905 m)    Weight 109.4 kg    Oxygen Saturation 99%    Body Mass Index 30.15 kg/m     Filed Weights   10/12/21 0600 10/13/21 0414 10/14/21 0500  Weight: 111 kg 110.4 kg 109.4 kg    Intake/Output Summary (Last 24 hours) at 10/14/2021 0848 Last data filed at 10/14/2021 0730 Gross per 24 hour  Intake 399.54 ml  Output 1225 ml  Net -825.46 ml   General chronically ill 36 year old male HENT NCAT lips less bloody w/ decreased bloody oral secretions  Pulm scattered rhonchi. No accessory use. RA sats mid 90s  Card RRR Abd soft not tender Ext generalized anasarca Neuro awake. Oriented  X2 still impulsive  Gu fl yellow    Resolved Hospital Problem list   Transaimitis - at admit -resolved 10/09/2021 Hypoxia resolved Septic shock (POA) secondary to [facility] urinary tract +/- lower torso ulcerations from Hidranetis source resolved Assessment & Plan:   Ecoli UTI Plan Complete Ceftriaxone 1/6 Keep foley in place given difficult insertion.  If or when he becomes more ambulatory and cooperative can remove after voiding trials.    Relative adrenal insufficiency and chronic Hypotension Plan  Dc solucortef  Cont midodrine at 65m tid (if BP drops could go back to 13m   Epistaxis w/ resultant aspiration Pneumonitis  -suspect bleeding from repeated NGT insertions in context of thrombocytopenia and coagulopathy  Plan Cont pulse oz Aspiration precautions Watch fever and WBC curve  Acute on Chronic dysphagia - Prior to & Present on Admit -D3 diet recommended November 2022-This admit worse and even failed D3 diet.  He continues to pull out feeding tubes even when heavily sedated. He has proven that this has also created opportunity for aspiration  Plan Re-consult SLP Sips w/ direct observation or RN or SLP w/ chin tuck Meds w/ apple sauce.  Will NOT place feeding tube back as we simply can't keep one in him without sedating him. I have updated his mother about this.   Acute on chronic metabolic Encephalopathy complicated by: Bipolar disease, schizoaffective disorder, ETOH abuse, chronic pain, opioid dependence and Post-traumatic stress.  -better, now off precedex.  Plan Avoid antipsychotics such as Haldol or seroquel given QTC issues Conte lexapro and latuda Cont xanax (better since adding back) Cont lactulose F/u w/ [name] and send dc summary to him at dc   Cirrhosis + with mild ascites in context of chronic ETOH abuse, chronic elevated ammonia and fatty liver (POA)  Plan Supportive care Lactulose   Chronic Prolonged QTc - > 52mec in Nov 2022 - Prior to &  Present on Admit. On admit 10/06/2021 - - worse QTc 6042mc  - multiple CNS Meds and low Mg -Haldol stopped 10/08/2021 10/11/2021: QTc finally normal at 467 ms without Haldol Plan Repeat 12 lead today   well controlled HIV (on biktarvy; followed by [facility] ID; cd4 1100 this admission, hiv vl undetectable 06/2021) Plan Cont current HIV regimen    Intermittent Fluid & electrolyte imbalance: hypernatremia, hypokalemia, hyperchloremia  Plan Adding D5 1/2 NS w/ KCL at 75 ml/hr  Replace K Repeat am chem    Known bicytopenia (hgb and  platelets) Severe acute on chronic thrombocytopenia this admit due to sepsis superimposed on ETOH -20s -status post 2 units PLTs and 3 units PRBCs this admit -hgb drifting down to 5.9 no clear bleeding today  Plan [facility] SCDs.  Transfuse 2 units Ck coags Increase PPI incase occult GI loss (he is not candidate for endoscopic procedure)   Mild coagulopathy ->suspect d/t CLD Plan Ck INR   Severe physical deconditioing with severe fraility. FTT and severe Protein calorie malnutrition  Prior to & Present on Admit -SNF recommended in past but pt refuses  -seen by palliative  Plan Maximize nutrition Needs SNF Will need Psych eval for competency   Open wound Right upper arm pit concerning for Hidradenitis supporativa, also has multiple areas of scarring from previous ulcerations  Plan Xeroform gauze dressing Tetracycline 50054mO x 3 mon (started 1/3)   Known Avascular necrosis - left hip - Prior to & Present on Admit  - worse on CT this admit 10/06/2021 Plan  Rx pain  Supportive care   Hypoglycemia Plan Added dextrose and MIVFs   Best Practice (right click and "Reselect all SmartList Selections" daily)   Diet/type: NPO w/ oral meds DVT prophylaxis: SCD GI prophylaxis: PPI Lines: Central line Foley:  Yes, and it is still needed Code Status:  full code Last date of multidisciplinary goals of care discussion [1/2  per palliative. ]  NA  PetErick ColaceNP-BC Leb[place]ger # [phone] no answer

## 2021-10-14 NOTE — Progress Notes (Signed)
Hypoglycemia treated per protocol. Elink called.

## 2021-10-14 NOTE — Assessment & Plan Note (Signed)
PRN pain management

## 2021-10-14 NOTE — Progress Notes (Signed)
Speech Language Pathology Treatment: Dysphagia  Patient Details Name: [name] MRN: [id number] DOB: [birth year] Today's Date: 10/14/2021 Time: 1657-9038 SLP Time Calculation (min) (ACUTE ONLY): 12 min  Assessment / Plan / Recommendation Clinical Impression  Pt is awake but lethargic, requires cueing to accept boluses and stay focused. He has to be redirected from shifting around in bed, trying to get up. SLP was able to spoon feed the pt nectar, honey thick and purees. Pt accepted these and did initiate a swallow, but his cough is delayed, weak and wet. There is currently a high probability of aspiration given severe deconditioning in the setting of dysphagia and malnourishment. SLP will proceed with MBS for objective reassessment of swallowing for best textures and methods. However, is it certain based on pts weakness, weak cough, altered mentation and chronic dysphagia that a 'safe' diet that also meets nutritional needs is not an achievable goal. Comfort feeding with known risk of aspiration may be the only way to provide any oral nutrition at this point. Again, will f/u with more specific objective findings after MBS. For now, if oral intake is needed, give meds crushed in puree, teaspoons of honey thick liquids.   HPI HPI: 36 y.o. male with hx of controlled HIV on medications, bipolar disorder, schizophrenia presented to the ED via EMS with complaints of sore on his buttocks. Dx sepsis. Hx of dysphagia and aspiration during prior admissions. Voice has been documented as weak/hoarse with low volume.  MBS 10/30/19 revealed pharyngeal dysphagia with intermittent aspiration of large boluses of thin liquids - dysphagia at that time was attributed to effects of COVID on respiratory/swallowing sequence. Repeat MBS 05/08/21 showed similar deficits with delay of swallow onset after materials were sitting in the pharynx, leading to spillover into the airway. During both studies, modifying bolus size helped  to prevent aspiration. His swallow was evaluated again at the bedside on 08/25/20, clinical impression was unchanged. When taking smaller sips of liquids there were no instances of coughing.  On morning of 12/30, pt was observed to likely aspirate applesauce, hence consult placed for SLP swallow eval.      SLP Plan  Continue with current plan of care      Recommendations for follow up therapy are one component of a multi-disciplinary discharge planning process, led by the attending physician.  Recommendations may be updated based on patient status, additional functional criteria and insurance authorization.    Recommendations  Diet recommendations: Other(comment) (consider comfort feeding. purees and nectar thick liquids via teaspoon to start) Liquids provided via: Teaspoon Medication Administration: Crushed with puree Supervision: Full supervision/cueing for compensatory strategies                Follow Up Recommendations: Skilled nursing-short term rehab (<3 hours/day) SLP Visit Diagnosis: Dysphagia, pharyngeal phase (R13.13) Plan: Continue with current plan of care           Sarie Stall, Katherene Ponto  10/14/2021, 11:29 AM

## 2021-10-14 NOTE — Progress Notes (Signed)
Spoke to IV team, who stated that someone was at [facility] who would be placing PICC line sometime shortly.

## 2021-10-14 NOTE — Progress Notes (Signed)
Night time doses of PO medications were held. Patient can not tolerate oral intake.   A small sip of water was offered prior to medication administration, when patient began coughing aggressively and vomting.  Medication held, will continue to monitor.

## 2021-10-14 NOTE — Assessment & Plan Note (Signed)
Cont HIV meds

## 2021-10-14 NOTE — Progress Notes (Signed)
eLink Physician-Brief Progress Note Patient Name: [name] DOB: [birth year] MRN: [id number]   Date of Service  10/14/2021  HPI/Events of Note  Hypoglycemia - Blood glucose = 63. Patient pulled out NGT yesterday.   eICU Interventions  Plan: D10W IV infusion to run at 40 mL/hour.      Intervention Category Major Interventions: Other:  Lysle Dingwall 10/14/2021, 4:33 AM

## 2021-10-14 NOTE — Assessment & Plan Note (Deleted)
Unable to keep feeding tube in place. Not candidate for Peg Plan Diet w/ precautions as able

## 2021-10-14 NOTE — Progress Notes (Addendum)
Goals of care discussion   I spoke to Mr Madelin Rear mother again via phone today [place]). I updated her on his current condition and also results of SLP eval. Unfortunately Mr Haris is in a very difficult situation. He essentially aspirates all consistencies of oral intake at bedside but also has proven to aspirate with naso-gastric feeding tube in place as well (mostly as he had pulled it out several times and caused epistaxis). We discussed possible PEG but given his malnutrition, coagulopathy and the fact that he repeatedly pull out his supportive tubes he is not in our opinion a candidate for this. I offered to Ms Constance Haw the alternative of keeping him restrained and sedated but we both agree that this is not a dignified plan of care for her son. Based on our discussion we agreed on the following.  1) Comfort care status w/ DNR w/ no escalation beyond IV fluids and antibiotics.  2) Comfort feeds. We are going to have a MBS tomorrow. Hopefully we can find a consistency he can tolerate a little better 3) Ms Constance Haw is agreeable to hospice for her son and is hopeful he will be a candidate for it. I have reached out to the palliative care teal and let them know of the changes.   [name] ACNP-BC [place] Pager # [phone] OR # [phone] if no answer

## 2021-10-14 NOTE — Progress Notes (Signed)
eLink Physician-Brief Progress Note Patient Name: CORDON GASSETT DOB: 15-May-1986 MRN: [id number]   Date of Service  10/14/2021  HPI/Events of Note  Anemia - Hgb = 5.9.  eICU Interventions  Will transfuse 2 units PRBC now.      Intervention Category Major Interventions: Other:  Lysle Dingwall 10/14/2021, 5:33 AM

## 2021-10-14 NOTE — Assessment & Plan Note (Signed)
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

## 2021-10-14 NOTE — Progress Notes (Signed)
Nutrition Follow-up  DOCUMENTATION CODES:   Non-severe (moderate) malnutrition in context of chronic illness  INTERVENTION:  - will order 30 ml Prosource Plus QID, each supplement provides 100 kcal and 15 grams protein. - will monitor for results of MBS on 1/6.   NUTRITION DIAGNOSIS:   Moderate Malnutrition related to chronic illness (HIV) as evidenced by mild fat depletion, mild muscle depletion. -ongoing  GOAL:   Patient will meet greater than or equal to 90% of their needs -unmet, unable to meet.  MONITOR:   PO intake, Labs, Weight trends, Skin   ASSESSMENT:   36 y.o. male with medical history of HIV, HTN, PTSD, bipolar 1 disorder, depression, seizures, migraine headaches, and peripheral neuropathy. He presented to the ED via EMS due to soreness to buttocks.  Patient discussed in rounds and separately with CCM NP, CCM MD, and SLP. Plan at this time is for comfort feeds starting today, MBS to be done on 1/6, and RD informed that mom desires for no PEG/G-tube.   Patient had NGT/small bore NGT placed on 1/1 x2 and on 1/3 x2. Patient has pulled all 4 of these tubes.   Weight has been stable since admission on 12/28. Mild pitting edema to BUE, moderate pitting edema to BLE, and moderate pitting edema to perineal area.      Labs reviewed; CBGs: 63, 105, 81, 86 mg/dl, Na: 147 mmol/l, K: 3.3 mmol/l, Cl: 115 mmol/l, BUN: 29 mg/dl, Ca: 8.5 mg/dl, Alk Phos elevated.  Medications reviewed; 500 mg ascorbic acid BID, 20 mg IV pepcid BID, 1 mg folvite/day, 10 g lactulose TID, 1 tablet multivitamin with minerals/day, 40 mg oral protonix BID, 40 mEq Klor-Con/day, 100 mg IV thiamine/day, 250 mg oral thiamine/day started on 1/5, 220 mg zinc sulfate/day.  IVF; D5-1/2 NS-40 mEq IV KCl @ 75 ml/hr (306 kcal/24 hrs).   Diet Order:   Diet Order             Diet NPO time specified  Diet effective now                   EDUCATION NEEDS:   Not appropriate for education at this  time  Skin:  Skin Assessment: Reviewed RN Assessment ([facility] note from 12/29)  Last BM:  1/5 (type 7 x1, medium amount)  Height:   Ht Readings from Last 1 Encounters:  10/06/21 6' 3"  (1.905 m)    Weight:   Wt Readings from Last 1 Encounters:  10/14/21 109.4 kg     Estimated Nutritional Needs:  Kcal:  2180-2400 kcal Protein:  110-125 grams Fluid:  >/= 2.3 L/day     Jarome Matin, MS, RD, LDN Inpatient Clinical Dietitian RD pager # available in [place]  After hours/weekend pager # available in [facility]

## 2021-10-14 NOTE — Progress Notes (Signed)
[facility] ADULT ICU REPLACEMENT PROTOCOL   The patient does apply for the [facility] Adult ICU Electrolyte Replacment Protocol based on the criteria listed below:   1.Exclusion criteria: TCTS patients, ECMO patients, and Dialysis patients 2. Is GFR >/= 30 ml/min? Yes.    Patient's GFR today is >60 3. Is SCr </= 2? Yes.   Patient's SCr is 1.03 mg/dL 4. Did SCr increase >/= 0.5 in 24 hours? No. 5.Pt's weight >40kg  Yes.   6. Abnormal electrolyte(s): K+ 3.3  7. Electrolytes replaced per protocol 8.  Call MD STAT for K+ </= 2.5, Phos </= 1, or Mag </= 1 Physician:  n/a  [name] 10/14/2021 5:28 AM

## 2021-10-15 ENCOUNTER — Inpatient Hospital Stay (HOSPITAL_COMMUNITY): Payer: Medicare HMO

## 2021-10-15 DIAGNOSIS — Z515 Encounter for palliative care: Secondary | ICD-10-CM

## 2021-10-15 DIAGNOSIS — A419 Sepsis, unspecified organism: Secondary | ICD-10-CM | POA: Diagnosis not present

## 2021-10-15 DIAGNOSIS — R6521 Severe sepsis with septic shock: Secondary | ICD-10-CM | POA: Diagnosis not present

## 2021-10-15 LAB — GLUCOSE, CAPILLARY
Glucose-Capillary: 65 mg/dL — ABNORMAL LOW (ref 70–99)
Glucose-Capillary: 132 mg/dL — ABNORMAL HIGH (ref 70–99)
Glucose-Capillary: 80 mg/dL (ref 70–99)
Glucose-Capillary: 94 mg/dL (ref 70–99)
Glucose-Capillary: 103 mg/dL — ABNORMAL HIGH (ref 70–99)
Glucose-Capillary: 98 mg/dL (ref 70–99)
Glucose-Capillary: 87 mg/dL (ref 70–99)

## 2021-10-15 LAB — CBC
HCT: 26.1 % — ABNORMAL LOW (ref 39.0–52.0)
Hemoglobin: 7.8 g/dL — ABNORMAL LOW (ref 13.0–17.0)
MCH: 29.7 pg (ref 26.0–34.0)
MCHC: 29.9 g/dL — ABNORMAL LOW (ref 30.0–36.0)
MCV: 99.2 fL (ref 80.0–100.0)
Platelets: 95 10*3/uL — ABNORMAL LOW (ref 150–400)
RBC: 2.63 MIL/uL — ABNORMAL LOW (ref 4.22–5.81)
RDW: 21.5 % — ABNORMAL HIGH (ref 11.5–15.5)
WBC: 14.3 10*3/uL — ABNORMAL HIGH (ref 4.0–10.5)
nRBC: 0.1 % (ref 0.0–0.2)

## 2021-10-15 LAB — TYPE AND SCREEN
ABO/RH(D): O POS
Antibody Screen: NEGATIVE
Unit division: 0
Unit division: 0

## 2021-10-15 LAB — BASIC METABOLIC PANEL
Anion gap: 5 (ref 5–15)
BUN: 21 mg/dL — ABNORMAL HIGH (ref 6–20)
CO2: 27 mmol/L (ref 22–32)
Calcium: 8.6 mg/dL — ABNORMAL LOW (ref 8.9–10.3)
Chloride: 116 mmol/L — ABNORMAL HIGH (ref 98–111)
Creatinine, Ser: 0.91 mg/dL (ref 0.61–1.24)
GFR, Estimated: >60 mL/min (ref >60)
Glucose, Bld: 138 mg/dL — ABNORMAL HIGH (ref 70–99)
Potassium: 3.8 mmol/L (ref 3.5–5.1)
Sodium: 148 mmol/L — ABNORMAL HIGH (ref 135–145)

## 2021-10-15 LAB — BPAM RBC
Blood Product Expiration Date: 202302012359
Blood Product Expiration Date: 202302012359
ISSUE DATE / TIME: 202301050929
ISSUE DATE / TIME: 202301051254
Unit Type and Rh: 5100
Unit Type and Rh: 5100

## 2021-10-15 MED ORDER — SODIUM CHLORIDE 0.9% FLUSH: 10.0000 mL | INTRAVENOUS | Status: DC | PRN

## 2021-10-15 MED ORDER — DEXTROSE 50 % IV SOLN
INTRAVENOUS | Status: AC
  Administered 2021-10-15: 12.5 mL
  Filled 2021-10-15: qty 50

## 2021-10-15 MED ORDER — DEXTROSE 10 % IV SOLN
INTRAVENOUS | Status: DC
  Administered 2021-10-15: 30 mL/h via INTRAVENOUS

## 2021-10-15 MED ORDER — SODIUM CHLORIDE 0.9% FLUSH
10.0000 mL | Freq: Two times a day (BID) | INTRAVENOUS | Status: DC
  Administered 2021-10-15 – 2021-11-02 (×33): 10 mL

## 2021-10-15 NOTE — Progress Notes (Signed)
PROGRESS NOTE    [name]  MOQ:[id number] DOB: 02-27-1986 DOA: 10/06/2021 PCP: [name], MD    Brief Narrative:  This 36 years old African-American male with PMH significant for tobacco abuse, HIV+ , bipolar disorder, hypertension, migraine, peripheral neuropathy, PTSD, schizoaffective disorder, seizure disorder presented in the ED with complaints of sores on his buttocks.  Patient had a visit from home health services who raised the concern that his sores have become infected.  Patient was noted to be hypothermic on arrival.  Labs in the ER showed lactic acid 6.4, potassium 2.7, abnormal LFTs.  Patient was hypotensive requiring pressor support.  Patient was agitated and restless requiring Precedex.  Subsequently patient was intubated and admitted in the ICU.  He is now off pressors, off Precedex drip. Septic shock resolved.  Patient continued to remain confused and has no capacity to make decisions. Palliative care consulted to discuss goals of care.  Mother is understanding that he has poor prognosis.  She is receptive to consider the options of hospice. PCCM pickup 10/15/2021.  Patient is currently on room air, he is on comfort feeds,  he has a Foley catheter, remains afebrile, is very deconditioned. Plan: Continue goals of care discussion with palliative care, transition to comfort care.  Assessment & Plan:   Active Problems:   Schizoaffective disorder, depressive type (HCC)   Hypokalemia   Elevated LFTs   Acute encephalopathy   Macrocytic anemia   Thrombocytopenia (HCC)   Prolonged QT interval   HIV positive (HCC)   Avascular necrosis of femoral head, left (HCC)   Hepatic steatosis   Cellulitis of multiple sites of buttock   Protein-calorie malnutrition, severe (HCC)   DNR (do not resuscitate)   Aspiration pneumonia ([place])   FTT (failure to thrive) in adult   Dysphagia   Coagulopathy (HCC)  Septic shock  sec. to E. coli UTI /  LE ulcerations: Patient is completing  antibiotics on 10/15/2021. Sepsis physiology resolved, continue midodrine. Continue Foley catheter given difficult insertion. Consider voiding trial when patient becomes more ambulatory.  Acute on chronic metabolic encephalopathy: Complicated by bipolar disorder, schizoaffective, EtOH abuse, opioid dependence and PTSD, He is off Precedex drip now. Avoid antipsychotics such as Haldol or Seroquel given QTC issues Continue Lexapro and Latuda Continue Xanax, Continue lactulose, Back to baseline mental status.  Chronic hypertension : Solu-Cortef was discontinued. Continue midodrine 5 mg 3 times daily.  Epistaxis: > Improved. Suspect bleeding from repeated NG tube insertions in context of thrombocytopenia and coagulopathy. Continue aspiration precautions.  Acute on chronic dysphagia: Patient failed dysphagia 3 diet, He continued to pull NG tube even when heavily sedated. Speech consulted recommended MBS. Continue sips with direct supervision.  EtOH cirrhosis: Chronic elevated ammonia: Continue lactulose  HIV: Continue Biktarvy  Mild coagulopathy: Suspect due to chronic liver disease.  Chronic anemia / thrombocytopenia: Could be due to EtOH abuse. S/p 2 units of platelets and 3 unit PRBC. Hemoglobin stable, continue to hold anticoagulation given hemoglobin drop. Continue SCDs.  Severe deconditioning with severe fragility Failure to thrive and protein calorie malnutrition: Patient is seen by palliative care: Maximize nutrition, needs long-term placement. Continue comfort feeds now.  Open wound right upper arm: multiple scarring from prior ulceration Continue Xeroform gauze dressing,  Continue tetracycline    DVT prophylaxis: SCDs Code Status: DNR Family Communication: No family at bed side. Disposition Plan:   Status is: Inpatient  Remains inpatient appropriate because: Admitted for septic shock which is resolved.  Patient is completing antibiotics.  Patient will need  comfort feeds and possible transition to hospice care.  Consultants:  PCCM, urology  Procedures:  Antimicrobials:  . Anti-infectives (From admission, onward)    Start     Dose/Rate Route Frequency Ordered Stop   10/14/21 1000  valACYclovir (VALTREX) tablet 1,000 mg        1,000 mg Oral Daily 10/13/21 1152     10/13/21 2000  tetracycline (SUMYCIN) capsule 500 mg        500 mg Oral 2 times daily 10/13/21 1152     10/12/21 0930  tetracycline (SUMYCIN) capsule 500 mg  Status:  Discontinued        500 mg Per Tube 2 times daily 10/12/21 0918 10/13/21 1152   10/10/21 1000  valACYclovir (VALTREX) tablet 1,000 mg  Status:  Discontinued        1,000 mg Per Tube Daily 10/09/21 1344 10/13/21 1152   10/09/21 2000  cefTRIAXone (ROCEPHIN) 2 g in sodium chloride 0.9 % 100 mL IVPB        2 g 200 mL/hr over 30 Minutes Intravenous Every 24 hours 10/09/21 1705 10/16/21 1959   10/07/21 1800  ceFEPIme (MAXIPIME) 2 g in sodium chloride 0.9 % 100 mL IVPB  Status:  Discontinued        2 g 200 mL/hr over 30 Minutes Intravenous Every 8 hours 10/07/21 0954 10/09/21 1705   10/07/21 1230  bictegravir-emtricitabine-tenofovir AF (BIKTARVY) 50-200-25 MG per tablet 1 tablet        1 tablet Oral Daily 10/07/21 1138     10/07/21 1200  metroNIDAZOLE (FLAGYL) IVPB 500 mg  Status:  Discontinued        500 mg 100 mL/hr over 60 Minutes Intravenous Every 12 hours 10/07/21 0951 10/08/21 1601   10/07/21 1200  vancomycin (VANCOREADY) IVPB 1750 mg/350 mL  Status:  Discontinued        1,750 mg 175 mL/hr over 120 Minutes Intravenous Every 12 hours 10/07/21 0958 10/09/21 1705   10/07/21 1100  ceFEPIme (MAXIPIME) 2 g in sodium chloride 0.9 % 100 mL IVPB        2 g 200 mL/hr over 30 Minutes Intravenous  Once 10/07/21 0952 10/07/21 1236   10/07/21 1000  valACYclovir (VALTREX) tablet 1,000 mg  Status:  Discontinued        1,000 mg Oral Daily 10/07/21 0232 10/09/21 1344   10/06/21 2000  piperacillin-tazobactam (ZOSYN) IVPB 3.375 g         3.375 g 100 mL/hr over 30 Minutes Intravenous  Once 10/06/21 1954 10/06/21 2238   10/06/21 2000  vancomycin (VANCOREADY) IVPB 2000 mg/400 mL        2,000 mg 200 mL/hr over 120 Minutes Intravenous  Once 10/06/21 1955 10/06/21 2350        Subjective: Patient was seen and examined at bedside. He is alert and oriented x 1,  following commands.   He continues to have cough, asking for food.  He has a right neck line which appears not clean.  Objective: Vitals:   10/15/21 0754 10/15/21 0800 10/15/21 0900 10/15/21 1000  BP:  118/76 104/66 117/74  Pulse:  (!) 129    Resp:  13 12 12   Temp:  98.8 F (37.1 C)    TempSrc:  Oral    SpO2: 100% 93%    Weight:      Height:        Intake/Output Summary (Last 24 hours) at 10/15/2021 1146 Last data filed at 10/15/2021 0800 Gross per 24  hour  Intake 1980.62 ml  Output 2550 ml  Net -569.38 ml   Filed Weights   10/13/21 0414 10/14/21 0500 10/15/21 0500  Weight: 110.4 kg 109.4 kg 109.4 kg    Examination:  General exam: Appears chronically ill, deconditioned, not in any acute distress, comfortable. Respiratory system: Decreased breath sounds, no accessory muscle use, respiratory effort normal. Cardiovascular system: S1 & S2 heard, RRR.  No murmur,  Gastrointestinal system: Abdomen is soft, nontender, nondistended, BS+ Central nervous system: Alert and oriented X 1. No focal neurological deficits. Extremities: Edema+ no cyanosis, no clubbing. Skin: Multiple open wounds on the right arm, lower extremities Psychiatry: Judgement and insight appear normal. Mood & affect appropriate.     Data Reviewed: I have personally reviewed following labs and imaging studies  CBC: Recent Labs  Lab 10/11/21 0620 10/12/21 0500 10/13/21 0416 10/14/21 0420 10/14/21 1849 10/15/21 0354  WBC 14.2* 10.0 10.7* 11.7*  --  14.3*  NEUTROABS  --   --   --  7.4  --   --   HGB 8.0* 7.0* 7.0* 5.9* 8.3* 7.8*  HCT 24.7* 22.3* 23.8* 19.2* 27.1* 26.1*  MCV  100.0 102.3* 106.7* 105.5*  --  99.2  PLT 43* 45* 55* 67*  --  95*   Basic Metabolic Panel: Recent Labs  Lab 10/11/21 0620 10/12/21 0500 10/12/21 1904 10/13/21 0416 10/13/21 1700 10/14/21 0420 10/15/21 0354  NA 139 143  --  144  --  147* 148*  K 4.1 4.0  --  3.9  --  3.3* 3.8  CL 110 113*  --  112*  --  115* 116*  CO2 25 27  --  26  --  28 27  GLUCOSE 120* 162*  --  164*  --  160* 138*  BUN 9 19  --  31*  --  29* 21*  CREATININE 0.96 1.18  --  1.02  --  1.03 0.91  CALCIUM 8.2* 8.2*  --  8.5*  --  8.5* 8.6*  MG 2.2   2.2 2.2 1.7 2.6* 2.1 2.2  --   PHOS 3.3   3.4 2.8 2.3* 2.2* 2.4* 2.5  --    GFR: Estimated Creatinine Clearance: 148.6 mL/min (by C-G formula based on SCr of 0.91 mg/dL). Liver Function Tests: Recent Labs  Lab 10/10/21 0433 10/11/21 0620 10/12/21 0500 10/13/21 0416 10/14/21 0420  AST 27 23 31  43* 36  ALT 34 32 32 42 40  ALKPHOS 225* 234* 271* 332* 300*  BILITOT 1.4* 1.2 1.0 0.9 0.8  PROT 5.9* 6.1* 5.9* 6.5 5.8*  ALBUMIN <1.5* <1.5* <1.5* <1.5* <1.5*   No results for input(s): LIPASE, AMYLASE in the last 168 hours. Recent Labs  Lab 10/10/21 1054  AMMONIA 123*   Coagulation Profile: Recent Labs  Lab 10/12/21 0500 10/14/21 1141  INR 1.5* 1.2   Cardiac Enzymes: No results for input(s): CKTOTAL, CKMB, CKMBINDEX, TROPONINI in the last 168 hours. BNP (last 3 results) No results for input(s): PROBNP in the last 8760 hours. HbA1C: No results for input(s): HGBA1C in the last 72 hours. CBG: Recent Labs  Lab 10/14/21 2329 10/15/21 0327 10/15/21 0349 10/15/21 0755 10/15/21 1139  GLUCAP 85 65* 132* 80 94   Lipid Profile: No results for input(s): CHOL, HDL, LDLCALC, TRIG, CHOLHDL, LDLDIRECT in the last 72 hours. Thyroid Function Tests: No results for input(s): TSH, T4TOTAL, FREET4, T3FREE, THYROIDAB in the last 72 hours. Anemia Panel: No results for input(s): VITAMINB12, FOLATE, FERRITIN, TIBC, IRON, RETICCTPCT in the  last 72 hours. Sepsis  Labs: Recent Labs  Lab 10/09/21 0602  PROCALCITON 0.52    Recent Results (from the past 240 hour(s))  Blood Culture (routine x 2)     Status: None   Collection Time: 10/06/21  6:55 PM   Specimen: BLOOD  Result Value Ref Range Status   Specimen Description   Final    BLOOD LEFT ANTECUBITAL Performed at [facility] [address] [place], [place] [address]    Special Requests   Final    BOTTLES DRAWN AEROBIC AND ANAEROBIC Blood Culture results may not be optimal due to an inadequate volume of blood received in culture bottles Performed at [facility] [address] [place], [place] [address]    Culture   Final    NO GROWTH 5 DAYS Performed at [facility], [place] [address] [place], [place] [address]    Report Status 10/11/2021 FINAL  Final  Blood Culture (routine x 2)     Status: None   Collection Time: 10/06/21  6:55 PM   Specimen: BLOOD  Result Value Ref Range Status   Specimen Description   Final    BLOOD RIGHT ANTECUBITAL Performed at [facility] [address] [place], [place] [address]    Special Requests   Final    BOTTLES DRAWN AEROBIC AND ANAEROBIC Blood Culture adequate volume Performed at [facility] [address], [place], [place] [address]    Culture   Final    NO GROWTH 5 DAYS Performed at [facility], [place] [address], [place], [place] [address]    Report Status 10/11/2021 FINAL  Final  Resp Panel by RT-PCR (Flu A&B, Covid) Nasopharyngeal Swab     Status: None   Collection Time: 10/07/21  1:02 AM   Specimen: Nasopharyngeal Swab; Nasopharyngeal(NP) swabs in vial transport medium  Result Value Ref Range Status   SARS Coronavirus 2 by RT PCR NEGATIVE NEGATIVE Final    Comment: (NOTE) SARS-CoV-2 target nucleic acids are NOT DETECTED.  The SARS-CoV-2 RNA is generally detectable in upper respiratory specimens during the acute phase of infection. The  lowest concentration of SARS-CoV-2 viral copies this assay can detect is 138 copies/mL. A negative result does not preclude SARS-Cov-2 infection and should not be used as the sole basis for treatment or other patient management decisions. A negative result may occur with  improper specimen collection/handling, submission of specimen other than nasopharyngeal swab, presence of viral mutation(s) within the areas targeted by this assay, and inadequate number of viral copies(<138 copies/mL). A negative result must be combined with clinical observations, patient history, and epidemiological information. The expected result is Negative.  Fact Sheet for Patients:  EntrepreneurPulse.com.au  Fact Sheet for Healthcare Providers:  IncredibleEmployment.be  This test is no t yet approved or cleared by the [place] FDA and  has been authorized for detection and/or diagnosis of SARS-CoV-2 by FDA under an Emergency Use Authorization (EUA). This EUA will remain  in effect (meaning this test can be used) for the duration of the COVID-19 declaration under Section 564(b)(1) of the Act, 21 U.S.C.section 360bbb-3(b)(1), unless the authorization is terminated  or revoked sooner.       Influenza A by PCR NEGATIVE NEGATIVE Final   Influenza B by PCR NEGATIVE NEGATIVE Final    Comment: (NOTE) The Xpert Xpress SARS-CoV-2/FLU/RSV plus assay is intended as an aid in the diagnosis of influenza from Nasopharyngeal swab specimens and should not  be used as a sole basis for treatment. Nasal washings and aspirates are unacceptable for Xpert Xpress SARS-CoV-2/FLU/RSV testing.  Fact Sheet for Patients: EntrepreneurPulse.com.au  Fact Sheet for Healthcare Providers: IncredibleEmployment.be  This test is not yet approved or cleared by the [place] FDA and has been authorized for detection and/or diagnosis of SARS-CoV-2 by FDA under  an Emergency Use Authorization (EUA). This EUA will remain in effect (meaning this test can be used) for the duration of the COVID-19 declaration under Section 564(b)(1) of the Act, 21 U.S.C. section 360bbb-3(b)(1), unless the authorization is terminated or revoked.  Performed at [facility], [place] [address], [place], [place] [address]   Urine Culture     Status: Abnormal   Collection Time: 10/07/21  2:00 AM   Specimen: In/Out Cath Urine  Result Value Ref Range Status   Specimen Description   Final    IN/OUT CATH URINE Performed at [facility] [address] [place], [place] [address]    Special Requests   Final    NONE Performed at [facility], [place] [address], [place], [place] [address]    Culture 20,000 COLONIES/mL ESCHERICHIA COLI (A)  Final   Report Status 10/09/2021 FINAL  Final   Organism ID, Bacteria ESCHERICHIA COLI (A)  Final      Susceptibility   Escherichia coli - MIC*    AMPICILLIN <=2 SENSITIVE Sensitive     CEFAZOLIN <=4 SENSITIVE Sensitive     CEFEPIME <=0.12 SENSITIVE Sensitive     CEFTRIAXONE <=0.25 SENSITIVE Sensitive     CIPROFLOXACIN <=0.25 SENSITIVE Sensitive     GENTAMICIN <=1 SENSITIVE Sensitive     IMIPENEM <=0.25 SENSITIVE Sensitive     NITROFURANTOIN <=16 SENSITIVE Sensitive     TRIMETH/SULFA <=20 SENSITIVE Sensitive     AMPICILLIN/SULBACTAM <=2 SENSITIVE Sensitive     PIP/TAZO <=4 SENSITIVE Sensitive     * 20,000 COLONIES/mL ESCHERICHIA COLI  MRSA Next Gen by PCR, Nasal     Status: Abnormal   Collection Time: 10/07/21  5:00 AM   Specimen: Nasal Mucosa; Nasal Swab  Result Value Ref Range Status   MRSA by PCR Next Gen DETECTED (A) NOT DETECTED Final    Comment: RESULT CALLED TO, READ BACK BY AND VERIFIED WITH: ROBINSON, A.  RN ON 10/07/2021 @ 0916 BY MECIAL J. (NOTE) The GeneXpert MRSA Assay (FDA approved for NASAL specimens only), is one component of a comprehensive MRSA colonization  surveillance program. It is not intended to diagnose MRSA infection nor to guide or monitor treatment for MRSA infections. Test performance is not FDA approved in patients less than 43 years old. Performed at [facility], [place] [address], [place], [place] [address]     Radiology Studies: [place] EKG SITE RITE  Result Date: 10/13/2021 If Site Rite image not attached, placement could not be confirmed due to current cardiac rhythm.   Scheduled Meds:  (feeding supplement) PROSource Plus  30 mL Oral QID   ALPRAZolam  2 mg Oral TID   vitamin C  500 mg Oral BID   bictegravir-emtricitabine-tenofovir AF  1 tablet Oral Daily   chlorhexidine  15 mL Mouth Rinse BID   Chlorhexidine Gluconate Cloth  6 each Topical Q0600   escitalopram  20 mg Oral Daily   folic acid  1 mg Oral Daily   free water  100 mL Per Tube Q4H   ipratropium-albuterol  3 mL Nebulization BID   lactulose  10 g Oral TID  lurasidone  120 mg Oral Daily   mouth rinse  15 mL Mouth Rinse q12n4p   midodrine  5 mg Oral TID WC   multivitamin with minerals  1 tablet Oral Daily   mupirocin ointment   Nasal BID   nicotine  14 mg Transdermal Daily   pantoprazole  40 mg Oral BID   potassium chloride  40 mEq Oral Daily   tetracycline  500 mg Oral BID   thiamine injection  100 mg Intravenous Daily   thiamine  250 mg Oral Daily   valACYclovir  1,000 mg Oral Daily   zinc sulfate  220 mg Oral Daily   Continuous Infusions:  sodium chloride 10 mL/hr at 10/14/21 0223   sodium chloride Stopped (10/12/21 0936)   cefTRIAXone (ROCEPHIN)  IV Stopped (10/14/21 2240)   dextrose 30 mL/hr (10/15/21 0359)   dextrose 5 % and 0.45 % NaCl with KCl 40 mEq/L 75 mL/hr at 10/15/21 0235   famotidine (PEPCID) IV Stopped (10/15/21 0931)     LOS: 8 days    Time spent: 50 mins    Habeeb Puertas, MD [facility]   If 7PM-7AM, please contact night-coverage

## 2021-10-15 NOTE — Progress Notes (Signed)
Daily Progress Note   Patient Name: [name]       Date: 10/15/2021 DOB: January 16, 1986  Age: 36 y.o. MRN#: [id number] Attending Physician: Shawna Clamp, MD Primary Care Physician: [name], MD Admit Date: 10/06/2021  Reason for Consultation/Follow-up: Establishing goals of care  Subjective:  Mr. Ballweg is resting in bed, appears much more awake alert, is able to follow commands, is able to tolerate POs such as soft foods and thickened fluids, brother Lytle Michaels is at the bedside,call placed and discussed with his mother Ms Constance Haw [phone].     Length of Stay: 8  Current Medications: Scheduled Meds:   (feeding supplement) PROSource Plus  30 mL Oral QID   ALPRAZolam  2 mg Oral TID   vitamin C  500 mg Oral BID   bictegravir-emtricitabine-tenofovir AF  1 tablet Oral Daily   chlorhexidine  15 mL Mouth Rinse BID   Chlorhexidine Gluconate Cloth  6 each Topical Q0600   escitalopram  20 mg Oral Daily   folic acid  1 mg Oral Daily   free water  100 mL Per Tube Q4H   ipratropium-albuterol  3 mL Nebulization BID   lactulose  10 g Oral TID   lurasidone  120 mg Oral Daily   mouth rinse  15 mL Mouth Rinse q12n4p   midodrine  5 mg Oral TID WC   multivitamin with minerals  1 tablet Oral Daily   mupirocin ointment   Nasal BID   nicotine  14 mg Transdermal Daily   pantoprazole  40 mg Oral BID   potassium chloride  40 mEq Oral Daily   tetracycline  500 mg Oral BID   thiamine injection  100 mg Intravenous Daily   thiamine  250 mg Oral Daily   valACYclovir  1,000 mg Oral Daily   zinc sulfate  220 mg Oral Daily    Continuous Infusions:  sodium chloride 10 mL/hr at 10/14/21 0223   sodium chloride Stopped (10/12/21 0936)   cefTRIAXone (ROCEPHIN)  IV Stopped  (10/14/21 2240)   dextrose 30 mL/hr at 10/15/21 1200   dextrose 5 % and 0.45 % NaCl with KCl 40 mEq/L 75 mL/hr at 10/15/21 1200   famotidine (PEPCID) IV Stopped (10/15/21 0931)    PRN Meds: albuterol, docusate sodium, food thickener, lip balm, polyethylene glycol  Physical Exam         General: awake alert no distress Has regular work of breathing Appears chronically ill But follows commands Not agitated Monitor noted      Vital Signs: BP 137/87    Pulse 69    Temp 98.6 F (37 C) (Oral)    Resp 14    Ht 6' 3"  (1.905 m)    Wt 109.4 kg    SpO2 99%    BMI 30.15 kg/m  SpO2: SpO2: 99 % O2 Device: O2 Device: Nasal Cannula O2 Flow Rate: O2 Flow Rate (L/min): 2 L/min  Intake/output summary:  Intake/Output Summary (Last 24 hours) at 10/15/2021 1421 Last data filed at 10/15/2021 1200 Gross per 24 hour  Intake 2690.59 ml  Output 2550 ml  Net 140.59 ml    LBM: Last BM Date:  (PTA) Baseline Weight: Weight: 108.9 kg Most recent weight: Weight: 109.4 kg       Palliative Assessment/Data:    Flowsheet Rows    Flowsheet Row Most Recent Value  Intake Tab   Referral Department Critical care  Unit at Time of Referral ICU  Palliative Care Primary Diagnosis Sepsis/Infectious Disease  Date Notified 10/09/21  Palliative Care Type New Palliative care  Reason for referral Clarify Goals of Care  Date of Admission 10/06/21  Date first seen by Palliative Care 10/10/21  # of days Palliative referral response time 1 Day(s)  # of days IP prior to Palliative referral 3  Clinical Assessment   Palliative Performance Scale Score 30%  Psychosocial & Spiritual Assessment   Palliative Care Outcomes   Patient/Family meeting held? Yes  Who was at the meeting? Patient, mother via phone       Patient Active Problem List   Diagnosis Date Noted   DNR (do not resuscitate) 10/14/2021   Aspiration pneumonia ([place]) 10/14/2021   FTT (failure to thrive) in adult 10/14/2021   Dysphagia  10/14/2021   Coagulopathy ([place]) 10/14/2021   Protein-calorie malnutrition, severe ([place]) 10/09/2021   HIV positive ([place]) 10/07/2021   Avascular necrosis of femoral head, left ([place]) 10/07/2021   Hepatic steatosis 10/07/2021   Cellulitis of multiple sites of buttock 10/07/2021   Hypotension    Hypoxia 08/24/2021   Left hip pain    Transaminitis    Acute metabolic encephalopathy 37/94/4461   Cellulitis 05/07/2021   Lymphadenopathy 09/29/2020   Anemia    Upper urinary tract infection    Sepsis secondary to UTI ([place]) 03/14/2020   Cellulitis of groin 03/14/2020   Elevated LFTs 03/14/2020   HIV disease ([place])    Acute encephalopathy    Macrocytic anemia    Thrombocytopenia (HCC)    Prolonged QT interval    Acute respiratory failure due to COVID-19 ([place]) 10/27/2019   GERD (gastroesophageal reflux disease) 10/27/2019   Hypokalemia 10/27/2019   Peripheral neuropathy 10/01/2019   PTSD (post-traumatic stress disorder) 07/23/2018   Dizziness and giddiness 02/01/2016   Migraine headache 02/01/2016   Schizoaffective disorder, depressive type ([place]) 05/13/2015   Severe alcohol dependence ([place]) 05/13/2015   Suicidal ideation 01/12/2014    Palliative Care Assessment & Plan   Patient Profile: 36 y.o. male  with past medical history of HIV, bipolar, depression, hypertension, migraine, schizoaffective disorder, seizures admitted on 10/06/2021 with hypothermia and concern for infected wounds.  At time of admission, he was started on Zosyn and vancomycin.  Labs revealed hypokalemia and elevated lactate.  Fluid resuscitation started.  He is remained very weak and continues to have significant dysphagia.  NG tube was placed to dysphagia.  Palliative consulted for goals of care.  Recommendations/Plan: DNR DNI Monitor PO intake and hospital course Call placed and discussed with mother: SNF rehab with palliative versus residential hospice, based on PO intake, functional  status.     Goals of Care and Additional Recommendations: Limitations on Scope of Treatment: Full Scope Treatment  Code Status:    Code Status Orders  (From admission, onward)           Start     Ordered   10/07/21 0225  Full code  Continuous        10/07/21 0228           Code Status History     Date Active Date Inactive Code Status Order ID Comments User Context   08/24/2021 0200 09/01/2021 0258 Full Code [id number]  [name], MD ED   05/07/2021 1912 05/19/2021 1841 Full Code [id number]  [name], MD ED   03/15/2020 0606 03/20/2020 1650 Full Code [id number]  [name], MD ED   10/27/2019 1815 11/02/2019 1902 Full Code [id number]  [name], MD Inpatient   07/22/2018 1722 07/22/2018 2158 Full Code [id number]  [name], NP Inpatient   07/22/2018 0832 07/22/2018 1703 Full Code [id number]  [name], MD ED   05/12/2015 1753 05/15/2015 1723 Full Code [id number]  [name], NP Inpatient   05/12/2015 0546 05/12/2015 1753 Full Code [id number]  [name], MD ED   01/12/2014 0256 01/12/2014 1650 Full Code [id number]  [name], MD ED       Prognosis:  Unable to determine  Discharge Planning: To Be Determined  Care plan was discussed with mother on phone.   Thank you for allowing the Palliative Medicine Team to assist in the care of this patient.   Time In: 12 Time Out: 12.25 Total Time 25 Prolonged Time Billed No   Loistine Chance, MD  Please contact Palliative Medicine Team phone at [phone] for questions and concerns.

## 2021-10-15 NOTE — Progress Notes (Signed)
Modified Barium Swallow Progress Note  Patient Details  Name: [name] MRN: [id number] Date of Birth: [birth year]  Today's Date: 10/15/2021  Modified Barium Swallow completed.  Full report located under Chart Review in the Imaging Section.  Brief recommendations include the following:  Clinical Impression  Pt presents with a moderate oropharyngeal dysphagia, with high risk of aspiration given waking and waning mental status, restlessness, decreased strength and efficacy of cough. Pt demonstrates decreased bolus cohesion, particularly with liquids, with boluses spilling and pooling in pyriform sinuses for 1-2 seonds prior to laryngeal elevation. Pt tolerates small cup sips and teaspoon sips well, but aspirates before the swallow with slightly larger boluses. Cough response does not eject aspirate. Expect that when he is lethargic, frequency and severity of aspiration is worse. Today he is very alert and following commands. Pt has no significant pharyngeal residue and would tolerate honey thick liquids even better when more altered. For now teaspoon or controlled cup sips of nectar thick liquids and regular solids are recommended to offer pt a wide varity of foods for his comfort.   Swallow Evaluation Recommendations       SLP Diet Recommendations: Regular solids;Nectar thick liquid   Liquid Administration via: Cup;No straw;Spoon   Medication Administration: Whole meds with puree   Supervision: Full assist for feeding;Staff to assist with self feeding   Compensations: Slow rate;Small sips/bites   Postural Changes: Seated upright at 90 degrees;Remain semi-upright after after feeds/meals (Comment)   Oral Care Recommendations: Oral care BID        Yareth Kearse, Katherene Ponto 10/15/2021,2:00 PM

## 2021-10-15 NOTE — Progress Notes (Signed)
Spoke to IV team about delay in placing PICC. They stated that PICC would not be placed  today due to only having one PICC nurse, who would not be at [facility] today.

## 2021-10-15 NOTE — Progress Notes (Signed)
eLink Physician-Brief Progress Note Patient Name: [name] DOB: September 17, 1986 MRN: [id number]   Date of Service  10/15/2021  HPI/Events of Note  Hypoglycemia - Blood glucose = 65. Currently on D5 0.45 NaCl + KCl 20 meq/L at 75 mL/hour. Patient not getting tube feeds.   eICU Interventions  Plan: D10W IV infusion to run at 30 mL/hour.     Intervention Category Major Interventions: Other:  Lysle Dingwall 10/15/2021, 3:44 AM

## 2021-10-15 NOTE — Progress Notes (Signed)
Patient was found attempting to pull PICC line out. He was reoriented and reposioned in bed. Fluids were offered, denies any pain.   Will continue to monitor.

## 2021-10-15 NOTE — Progress Notes (Signed)
Peripherally Inserted Central Catheter Placement  The IV Nurse has discussed with the patient and/or persons authorized to consent for the patient, the purpose of this procedure and the potential benefits and risks involved with this procedure.  The benefits include less needle sticks, lab draws from the catheter, and the patient may be discharged home with the catheter. Risks include, but not limited to, infection, bleeding, blood clot (thrombus formation), and puncture of an artery; nerve damage and irregular heartbeat and possibility to perform a PICC exchange if needed/ordered by physician.  Alternatives to this procedure were also discussed.  Bard Power PICC patient education guide, fact sheet on infection prevention and patient information card has been provided to patient /or left at bedside.    PICC Placement Documentation  PICC Double Lumen 10/15/21 PICC Right Brachial 42 cm 0 cm (Active)  Indication for Insertion or Continuance of Line Prolonged intravenous therapies 10/15/21 1824  Exposed Catheter (cm) 0 cm 10/15/21 1824  Site Assessment Clean;Dry;Intact 10/15/21 1824  Lumen #1 Status Flushed;Saline locked;Blood return noted 10/15/21 1824  Lumen #2 Status Flushed;Saline locked;Blood return noted 10/15/21 1824  Dressing Type Transparent;Securing device 10/15/21 1824  Dressing Status Clean;Dry;Intact 10/15/21 1824  Antimicrobial disc in place? Yes 10/15/21 1824  Safety Lock Not Applicable 45/99/77 4142  Dressing Intervention Other (Comment) 10/15/21 1824  Dressing Change Due 10/22/21 10/15/21 [place], Rishab Stoudt 10/15/2021, 6:25 PM

## 2021-10-16 DIAGNOSIS — R627 Adult failure to thrive: Secondary | ICD-10-CM | POA: Diagnosis not present

## 2021-10-16 DIAGNOSIS — Z66 Do not resuscitate: Secondary | ICD-10-CM | POA: Diagnosis not present

## 2021-10-16 DIAGNOSIS — R6521 Severe sepsis with septic shock: Secondary | ICD-10-CM | POA: Diagnosis not present

## 2021-10-16 DIAGNOSIS — A419 Sepsis, unspecified organism: Secondary | ICD-10-CM | POA: Diagnosis not present

## 2021-10-16 LAB — GLUCOSE, CAPILLARY
Glucose-Capillary: 86 mg/dL (ref 70–99)
Glucose-Capillary: 73 mg/dL (ref 70–99)
Glucose-Capillary: 102 mg/dL — ABNORMAL HIGH (ref 70–99)
Glucose-Capillary: 84 mg/dL (ref 70–99)
Glucose-Capillary: 107 mg/dL — ABNORMAL HIGH (ref 70–99)
Glucose-Capillary: 93 mg/dL (ref 70–99)

## 2021-10-16 LAB — BASIC METABOLIC PANEL
Anion gap: 3 — ABNORMAL LOW (ref 5–15)
BUN: 14 mg/dL (ref 6–20)
CO2: 27 mmol/L (ref 22–32)
Calcium: 8.4 mg/dL — ABNORMAL LOW (ref 8.9–10.3)
Chloride: 116 mmol/L — ABNORMAL HIGH (ref 98–111)
Creatinine, Ser: 0.77 mg/dL (ref 0.61–1.24)
GFR, Estimated: >60 mL/min (ref >60)
Glucose, Bld: 94 mg/dL (ref 70–99)
Potassium: 4 mmol/L (ref 3.5–5.1)
Sodium: 146 mmol/L — ABNORMAL HIGH (ref 135–145)

## 2021-10-16 LAB — CBC
HCT: 27.7 % — ABNORMAL LOW (ref 39.0–52.0)
Hemoglobin: 8.2 g/dL — ABNORMAL LOW (ref 13.0–17.0)
MCH: 30.3 pg (ref 26.0–34.0)
MCHC: 29.6 g/dL — ABNORMAL LOW (ref 30.0–36.0)
MCV: 102.2 fL — ABNORMAL HIGH (ref 80.0–100.0)
Platelets: 105 10*3/uL — ABNORMAL LOW (ref 150–400)
RBC: 2.71 MIL/uL — ABNORMAL LOW (ref 4.22–5.81)
RDW: 21.1 % — ABNORMAL HIGH (ref 11.5–15.5)
WBC: 11.5 10*3/uL — ABNORMAL HIGH (ref 4.0–10.5)
nRBC: 0 % (ref 0.0–0.2)

## 2021-10-16 LAB — MAGNESIUM: Magnesium: 1.6 mg/dL — ABNORMAL LOW (ref 1.7–2.4)

## 2021-10-16 LAB — PHOSPHORUS: Phosphorus: 3.3 mg/dL (ref 2.5–4.6)

## 2021-10-16 MED ORDER — MAGNESIUM SULFATE 2 GM/50ML IV SOLN
2.0000 g | Freq: Once | INTRAVENOUS | Status: AC
  Administered 2021-10-16: 2 g via INTRAVENOUS
  Filled 2021-10-16: qty 50

## 2021-10-16 MED ORDER — OLANZAPINE 5 MG PO TBDP
5.0000 mg | ORAL_TABLET | Freq: Every day | ORAL | Status: DC
  Administered 2021-10-16 – 2021-11-03 (×18): 5 mg via ORAL
  Filled 2021-10-16 (×21): qty 1

## 2021-10-16 MED ORDER — HALOPERIDOL LACTATE 5 MG/ML IJ SOLN
2.0000 mg | Freq: Once | INTRAMUSCULAR | Status: AC
  Administered 2021-10-16: 2 mg via INTRAVENOUS
  Filled 2021-10-16: qty 1

## 2021-10-16 NOTE — Progress Notes (Signed)
Worked with PT on deep breathing and coughing- small, yellow, thin, productive cough. Ordered CPT.

## 2021-10-16 NOTE — Progress Notes (Signed)
Patient ID: [name], male   DOB: 03-05-1986, 36 y.o.   MRN: [id number] Patient is very agitated, most recent QTc is 459, will try one time 2 mg of IV halodol. Phillips Climes MD

## 2021-10-16 NOTE — Progress Notes (Signed)
Daily Progress Note   Patient Name: [name]       Date: 10/16/2021 DOB: [birth year]  Age: 36 y.o. MRN#: [id number] Attending Physician: Shawna Clamp, MD Primary Care Physician: [name], MD Admit Date: 10/06/2021  Reason for Consultation/Follow-up: Establishing goals of care  Subjective:  Mr. Pask is resting in bed, PO intake is good, overnight events noted patient got Haldol for agitation.     Length of Stay: 9  Current Medications: Scheduled Meds:   (feeding supplement) PROSource Plus  30 mL Oral QID   ALPRAZolam  2 mg Oral TID   vitamin C  500 mg Oral BID   bictegravir-emtricitabine-tenofovir AF  1 tablet Oral Daily   chlorhexidine  15 mL Mouth Rinse BID   Chlorhexidine Gluconate Cloth  6 each Topical Q0600   escitalopram  20 mg Oral Daily   folic acid  1 mg Oral Daily   ipratropium-albuterol  3 mL Nebulization BID   lactulose  10 g Oral TID   lurasidone  120 mg Oral Daily   mouth rinse  15 mL Mouth Rinse q12n4p   midodrine  5 mg Oral TID WC   multivitamin with minerals  1 tablet Oral Daily   mupirocin ointment   Nasal BID   nicotine  14 mg Transdermal Daily   OLANZapine zydis  5 mg Oral QHS   pantoprazole  40 mg Oral BID   potassium chloride  40 mEq Oral Daily   sodium chloride flush  10-40 mL Intracatheter Q12H   tetracycline  500 mg Oral BID   thiamine injection  100 mg Intravenous Daily   thiamine  250 mg Oral Daily   valACYclovir  1,000 mg Oral Daily   zinc sulfate  220 mg Oral Daily    Continuous Infusions:  sodium chloride Stopped (10/14/21 0548)   sodium chloride Stopped (10/12/21 0936)   dextrose 30 mL/hr at 10/16/21 1204   dextrose 5 % and 0.45 % NaCl with KCl 40 mEq/L 75 mL/hr at 10/16/21 1204   famotidine  (PEPCID) IV Stopped (10/16/21 1100)    PRN Meds: albuterol, docusate sodium, food thickener, lip balm, polyethylene glycol, sodium chloride flush  Physical Exam         General: awake alert no distress Has regular work of breathing Appears chronically ill Not agitated Monitor  noted      Vital Signs: BP (!) 135/95    Pulse 81    Temp (!) 97.5 F (36.4 C) (Oral)    Resp 14    Ht 6' 3"  (1.905 m)    Wt 110.7 kg    SpO2 100%    BMI 30.50 kg/m  SpO2: SpO2: 100 % O2 Device: O2 Device: Room Air O2 Flow Rate: O2 Flow Rate (L/min): 2 L/min  Intake/output summary:  Intake/Output Summary (Last 24 hours) at 10/16/2021 1259 Last data filed at 10/16/2021 1204 Gross per 24 hour  Intake 2768.52 ml  Output 1050 ml  Net 1718.52 ml    LBM: Last BM Date:  (PTA) Baseline Weight: Weight: 108.9 kg Most recent weight: Weight: 110.7 kg       Palliative Assessment/Data:    Flowsheet Rows    Flowsheet Row Most Recent Value  Intake Tab   Referral Department Critical care  Unit at Time of Referral ICU  Palliative Care Primary Diagnosis Sepsis/Infectious Disease  Date Notified 10/09/21  Palliative Care Type New Palliative care  Reason for referral Clarify Goals of Care  Date of Admission 10/06/21  Date first seen by Palliative Care 10/10/21  # of days Palliative referral response time 1 Day(s)  # of days IP prior to Palliative referral 3  Clinical Assessment   Palliative Performance Scale Score 30%  Psychosocial & Spiritual Assessment   Palliative Care Outcomes   Patient/Family meeting held? Yes  Who was at the meeting? Patient, mother via phone       Patient Active Problem List   Diagnosis Date Noted   Palliative care by specialist    DNR (do not resuscitate) 10/14/2021   Aspiration pneumonia ([place]) 10/14/2021   FTT (failure to thrive) in adult 10/14/2021   Dysphagia 10/14/2021   Coagulopathy ([place]) 10/14/2021   Protein-calorie malnutrition, severe ([place]) 10/09/2021   HIV  positive ([place]) 10/07/2021   Avascular necrosis of femoral head, left ([place]) 10/07/2021   Hepatic steatosis 10/07/2021   Cellulitis of multiple sites of buttock 10/07/2021   Hypotension    Hypoxia 08/24/2021   Left hip pain    Transaminitis    Acute metabolic encephalopathy 48/25/0037   Cellulitis 05/07/2021   Lymphadenopathy 09/29/2020   Anemia    Upper urinary tract infection    Sepsis secondary to UTI ([place]) 03/14/2020   Cellulitis of groin 03/14/2020   Elevated LFTs 03/14/2020   HIV disease ([place])    Acute encephalopathy    Macrocytic anemia    Thrombocytopenia ([facility])    Prolonged QT interval    Acute respiratory failure due to COVID-19 ([place]) 10/27/2019   GERD (gastroesophageal reflux disease) 10/27/2019   Hypokalemia 10/27/2019   Peripheral neuropathy 10/01/2019   PTSD (post-traumatic stress disorder) 07/23/2018   Dizziness and giddiness 02/01/2016   Migraine headache 02/01/2016   Schizoaffective disorder, depressive type ([place]) 05/13/2015   Severe alcohol dependence ([place]) 05/13/2015   Suicidal ideation 01/12/2014    Palliative Care Assessment & Plan   Patient Profile: 36 y.o. male  with past medical history of HIV, bipolar, depression, hypertension, migraine, schizoaffective disorder, seizures admitted on 10/06/2021 with hypothermia and concern for infected wounds.  At time of admission, he was started on Zosyn and vancomycin.  Labs revealed hypokalemia and elevated lactate.  Fluid resuscitation started.  He is remained very weak and continues to have significant dysphagia.  NG tube was placed to dysphagia.  Palliative consulted for goals of care.  Recommendations/Plan: DNR DNI Monitor PO  intake and hospital course Recommend  SNF rehab with palliative  Add low dose Olanzapine and monitor.    Goals of Care and Additional Recommendations: Limitations on Scope of Treatment: Full Scope Treatment  Code Status:    Code Status Orders  (From  admission, onward)           Start     Ordered   10/07/21 0225  Full code  Continuous        10/07/21 0228           Code Status History     Date Active Date Inactive Code Status Order ID Comments User Context   08/24/2021 0200 09/01/2021 0258 Full Code [id number]  [name], MD ED   05/07/2021 1912 05/19/2021 1841 Full Code [id number]  [name], MD ED   03/15/2020 0606 03/20/2020 1650 Full Code [id number]  [name], MD ED   10/27/2019 1815 11/02/2019 1902 Full Code [id number]  [name], MD Inpatient   07/22/2018 1722 07/22/2018 2158 Full Code [id number]  [name], NP Inpatient   07/22/2018 0832 07/22/2018 1703 Full Code [id number]  [name], MD ED   05/12/2015 1753 05/15/2015 1723 Full Code [id number]  [name], NP Inpatient   05/12/2015 0546 05/12/2015 1753 Full Code [id number]  [name], MD ED   01/12/2014 0256 01/12/2014 1650 Full Code [id number]  [name], MD ED       Prognosis:  Unable to determine  Discharge Planning: To Be Determined  Care plan was discussed with Dr Dwyane Dee.   Thank you for allowing the Palliative Medicine Team to assist in the care of this patient.   Time In: 12 Time Out: 12.25 Total Time 25 Prolonged Time Billed No   Loistine Chance, MD  Please contact Palliative Medicine Team phone at [phone] for questions and concerns.

## 2021-10-16 NOTE — Progress Notes (Signed)
Patient continues requesting to leave, while removing essential equipment.  Equipment replaced, PICC line wrapped and hidden away from sight. Attempted reorienting without success.   [facility] hospitalist messaged.

## 2021-10-16 NOTE — Progress Notes (Addendum)
PROGRESS NOTE    [name]  BJY:[id number] DOB: 22-Jun-1986 DOA: 10/06/2021 PCP: [name], MD    Brief Narrative:  This 36 years old African-American male with PMH significant for tobacco abuse, HIV+ , bipolar disorder, hypertension, migraine, peripheral neuropathy, PTSD, schizoaffective disorder, seizure disorder presented in the ED with complaints of sores on his buttocks.  Patient had a visit from home health services who raised the concern that his sores have become infected.  Patient was noted to be hypothermic on arrival.  Labs in the ER showed lactic acid 6.4, potassium 2.7, abnormal LFTs.  Patient was hypotensive requiring pressor support.  Patient was agitated and restless requiring Precedex.  Subsequently patient was intubated and admitted in the ICU.  He is now off pressors, off Precedex drip. Septic shock resolved.  Patient continued to remain confused and has no capacity to make decisions. Palliative care consulted to discuss goals of care.  Mother is understanding that he has poor prognosis.  She is receptive to consider the options of hospice. PCCM pickup 10/15/2021.  Patient is currently on room air, he is on comfort feeds,  he has a Foley catheter, remains afebrile, He is very deconditioned. Plan: Continue goals of care discussion with palliative care, transition to comfort care.  Assessment & Plan:   Active Problems:   Schizoaffective disorder, depressive type (HCC)   Hypokalemia   Elevated LFTs   Acute encephalopathy   Macrocytic anemia   Thrombocytopenia (HCC)   Prolonged QT interval   HIV positive (HCC)   Avascular necrosis of femoral head, left (HCC)   Hepatic steatosis   Cellulitis of multiple sites of buttock   Protein-calorie malnutrition, severe (HCC)   DNR (do not resuscitate)   Aspiration pneumonia ([place])   FTT (failure to thrive) in adult   Dysphagia   Coagulopathy ([place])   Palliative care by specialist  Septic shock sec. to E. coli UTI /  LE  ulcerations: Patient completed course of antibiotics on 10/15/2021. Sepsis physiology resolved, Continue midodrine. Continue Foley catheter given difficult insertion. Consider voiding trial when patient becomes more ambulatory.  Acute on chronic metabolic encephalopathy: Complicated by bipolar disorder, schizoaffective, EtOH abuse, opioid dependence and PTSD, He is off Precedex drip now. Avoid antipsychotics such as Haldol or Seroquel given QTC issues Continue Lexapro and Latuda Continue Xanax, Continue lactulose, Back to baseline mental status with intermittent agitation and restlessness requiring antipsychotics.  Chronic hypotension : Solu-Cortef was discontinued/ completed stress dose steroids.. Continue midodrine 5 mg 3 times daily.  Epistaxis: > Improved. Suspect bleeding from repeated NG tube insertions in context of thrombocytopenia and coagulopathy. Continue aspiration precautions.  Acute on chronic dysphagia: Patient failed dysphagia 3 diet, He continued to pull NG tube even when heavily sedated. Speech consulted recommended MBS. Continue sips with direct supervision. Speech recommended thick liquids.  EtOH cirrhosis: Chronic elevated ammonia: Continue lactulose.  HIV: Continue Biktarvy.  Mild coagulopathy: Suspect due to chronic liver disease.  Chronic anemia / thrombocytopenia: Could be due to EtOH abuse. S/p 2 units of platelets and 3 unit PRBC. Hemoglobin stable, Continue to hold anticoagulation given hemoglobin drop.  Continue SCDs.  Severe deconditioning with severe fragility Failure to thrive and protein calorie malnutrition: Patient is seen by palliative care. Maximize nutrition, needs long-term placement. Continue comfort feeds now.  Open wound right upper arm:  Multiple scarring from prior ulceration Continue Xeroform gauze dressing,   Continue tetracycline    DVT prophylaxis: SCDs Code Status: DNR Family Communication: No family at bed  side. Disposition Plan:   Status is: Inpatient  Remains inpatient appropriate because:  Admitted for septic shock which is resolved.  Patient completed antibiotics.   Patient will need comfort feeds and possible transition to hospice care.  Consultants:  PCCM, urology  Procedures:  Antimicrobials:  . Anti-infectives (From admission, onward)    Start     Dose/Rate Route Frequency Ordered Stop   10/14/21 1000  valACYclovir (VALTREX) tablet 1,000 mg        1,000 mg Oral Daily 10/13/21 1152     10/13/21 2000  tetracycline (SUMYCIN) capsule 500 mg        500 mg Oral 2 times daily 10/13/21 1152     10/12/21 0930  tetracycline (SUMYCIN) capsule 500 mg  Status:  Discontinued        500 mg Per Tube 2 times daily 10/12/21 0918 10/13/21 1152   10/10/21 1000  valACYclovir (VALTREX) tablet 1,000 mg  Status:  Discontinued        1,000 mg Per Tube Daily 10/09/21 1344 10/13/21 1152   10/09/21 2000  cefTRIAXone (ROCEPHIN) 2 g in sodium chloride 0.9 % 100 mL IVPB        2 g 200 mL/hr over 30 Minutes Intravenous Every 24 hours 10/09/21 1705 10/15/21 2143   10/07/21 1800  ceFEPIme (MAXIPIME) 2 g in sodium chloride 0.9 % 100 mL IVPB  Status:  Discontinued        2 g 200 mL/hr over 30 Minutes Intravenous Every 8 hours 10/07/21 0954 10/09/21 1705   10/07/21 1230  bictegravir-emtricitabine-tenofovir AF (BIKTARVY) 50-200-25 MG per tablet 1 tablet        1 tablet Oral Daily 10/07/21 1138     10/07/21 1200  metroNIDAZOLE (FLAGYL) IVPB 500 mg  Status:  Discontinued        500 mg 100 mL/hr over 60 Minutes Intravenous Every 12 hours 10/07/21 0951 10/08/21 1601   10/07/21 1200  vancomycin (VANCOREADY) IVPB 1750 mg/350 mL  Status:  Discontinued        1,750 mg 175 mL/hr over 120 Minutes Intravenous Every 12 hours 10/07/21 0958 10/09/21 1705   10/07/21 1100  ceFEPIme (MAXIPIME) 2 g in sodium chloride 0.9 % 100 mL IVPB        2 g 200 mL/hr over 30 Minutes Intravenous  Once 10/07/21 0952 10/07/21 1236    10/07/21 1000  valACYclovir (VALTREX) tablet 1,000 mg  Status:  Discontinued        1,000 mg Oral Daily 10/07/21 0232 10/09/21 1344   10/06/21 2000  piperacillin-tazobactam (ZOSYN) IVPB 3.375 g        3.375 g 100 mL/hr over 30 Minutes Intravenous  Once 10/06/21 1954 10/06/21 2238   10/06/21 2000  vancomycin (VANCOREADY) IVPB 2000 mg/400 mL        2,000 mg 200 mL/hr over 120 Minutes Intravenous  Once 10/06/21 1955 10/06/21 2350        Subjective: Patient was seen and examined at bedside.  Overnight events noted. Patient was agitated and restless,  trying to get out of bed.  Patient was given Haldol once. Patient underwent modified barium swallow was recommended thick liquids.     Objective: Vitals:   10/16/21 0800 10/16/21 0828 10/16/21 0849 10/16/21 0900  BP: (!) 138/93   124/79  Pulse:      Resp: 15  14 15   Temp:   98.4 F (36.9 C)   TempSrc:   Oral   SpO2:  100%    Weight:  Height:        Intake/Output Summary (Last 24 hours) at 10/16/2021 1052 Last data filed at 10/16/2021 0900 Gross per 24 hour  Intake 3613.26 ml  Output 1050 ml  Net 2563.26 ml   Filed Weights   10/14/21 0500 10/15/21 0500 10/16/21 0623  Weight: 109.4 kg 109.4 kg 110.7 kg    Examination:  General exam: Appears chronically ill looking, comfortable, deconditioned, not in any distress. Respiratory system: Decreased breath sounds bilaterally, not in distress, no accessory muscle use.  RR 17 Cardiovascular system: S1 & S2 heard, RRR.  No murmur,  Gastrointestinal system: Abdomen is soft, nontender, nondistended, BS+ Central nervous system: Alert and oriented X 1. No focal neurological deficits. Extremities: Edema+ no cyanosis, no clubbing. Skin: Multiple open wounds on the right arm, lower extremities Psychiatry: Mood & affect appropriate.     Data Reviewed: I have personally reviewed following labs and imaging studies  CBC: Recent Labs  Lab 10/12/21 0500 10/13/21 0416 10/14/21 0420  10/14/21 1849 10/15/21 0354 10/16/21 0443  WBC 10.0 10.7* 11.7*  --  14.3* 11.5*  NEUTROABS  --   --  7.4  --   --   --   HGB 7.0* 7.0* 5.9* 8.3* 7.8* 8.2*  HCT 22.3* 23.8* 19.2* 27.1* 26.1* 27.7*  MCV 102.3* 106.7* 105.5*  --  99.2 102.2*  PLT 45* 55* 67*  --  95* 401*   Basic Metabolic Panel: Recent Labs  Lab 10/12/21 0500 10/12/21 1904 10/13/21 0416 10/13/21 1700 10/14/21 0420 10/15/21 0354 10/16/21 0443  NA 143  --  144  --  147* 148* 146*  K 4.0  --  3.9  --  3.3* 3.8 4.0  CL 113*  --  112*  --  115* 116* 116*  CO2 27  --  26  --  28 27 27   GLUCOSE 162*  --  164*  --  160* 138* 94  BUN 19  --  31*  --  29* 21* 14  CREATININE 1.18  --  1.02  --  1.03 0.91 0.77  CALCIUM 8.2*  --  8.5*  --  8.5* 8.6* 8.4*  MG 2.2 1.7 2.6* 2.1 2.2  --  1.6*  PHOS 2.8 2.3* 2.2* 2.4* 2.5  --  3.3   GFR: Estimated Creatinine Clearance: 169.9 mL/min (by C-G formula based on SCr of 0.77 mg/dL). Liver Function Tests: Recent Labs  Lab 10/10/21 0433 10/11/21 0620 10/12/21 0500 10/13/21 0416 10/14/21 0420  AST 27 23 31  43* 36  ALT 34 32 32 42 40  ALKPHOS 225* 234* 271* 332* 300*  BILITOT 1.4* 1.2 1.0 0.9 0.8  PROT 5.9* 6.1* 5.9* 6.5 5.8*  ALBUMIN <1.5* <1.5* <1.5* <1.5* <1.5*   No results for input(s): LIPASE, AMYLASE in the last 168 hours. Recent Labs  Lab 10/10/21 1054  AMMONIA 123*   Coagulation Profile: Recent Labs  Lab 10/12/21 0500 10/14/21 1141  INR 1.5* 1.2   Cardiac Enzymes: No results for input(s): CKTOTAL, CKMB, CKMBINDEX, TROPONINI in the last 168 hours. BNP (last 3 results) No results for input(s): PROBNP in the last 8760 hours. HbA1C: No results for input(s): HGBA1C in the last 72 hours. CBG: Recent Labs  Lab 10/15/21 1541 10/15/21 1928 10/15/21 2317 10/16/21 0453 10/16/21 0754  GLUCAP 103* 98 87 86 73   Lipid Profile: No results for input(s): CHOL, HDL, LDLCALC, TRIG, CHOLHDL, LDLDIRECT in the last 72 hours. Thyroid Function Tests: No results for  input(s): TSH, T4TOTAL, FREET4, T3FREE, THYROIDAB  in the last 72 hours. Anemia Panel: No results for input(s): VITAMINB12, FOLATE, FERRITIN, TIBC, IRON, RETICCTPCT in the last 72 hours. Sepsis Labs: No results for input(s): PROCALCITON, LATICACIDVEN in the last 168 hours.   Recent Results (from the past 240 hour(s))  Blood Culture (routine x 2)     Status: None   Collection Time: 10/06/21  6:55 PM   Specimen: BLOOD  Result Value Ref Range Status   Specimen Description   Final    BLOOD LEFT ANTECUBITAL Performed at [facility] [address], [place], [place] [address]    Special Requests   Final    BOTTLES DRAWN AEROBIC AND ANAEROBIC Blood Culture results may not be optimal due to an inadequate volume of blood received in culture bottles Performed at [facility] [address] [place], [place] [address]    Culture   Final    NO GROWTH 5 DAYS Performed at [facility], [place] [address] [place], [place] [address]    Report Status 10/11/2021 FINAL  Final  Blood Culture (routine x 2)     Status: None   Collection Time: 10/06/21  6:55 PM   Specimen: BLOOD  Result Value Ref Range Status   Specimen Description   Final    BLOOD RIGHT ANTECUBITAL Performed at [facility] [address], [place], [place] [address]    Special Requests   Final    BOTTLES DRAWN AEROBIC AND ANAEROBIC Blood Culture adequate volume Performed at [facility] [address], [place], [place] [address]    Culture   Final    NO GROWTH 5 DAYS Performed at [facility], [place] [address], [place],  [address]    Report Status 10/11/2021 FINAL  Final  Resp Panel by RT-PCR (Flu A&B, Covid) Nasopharyngeal Swab     Status: None   Collection Time: 10/07/21  1:02 AM   Specimen: Nasopharyngeal Swab; Nasopharyngeal(NP) swabs in vial transport medium  Result Value Ref Range Status   SARS Coronavirus 2 by RT PCR NEGATIVE  NEGATIVE Final    Comment: (NOTE) SARS-CoV-2 target nucleic acids are NOT DETECTED.  The SARS-CoV-2 RNA is generally detectable in upper respiratory specimens during the acute phase of infection. The lowest concentration of SARS-CoV-2 viral copies this assay can detect is 138 copies/mL. A negative result does not preclude SARS-Cov-2 infection and should not be used as the sole basis for treatment or other patient management decisions. A negative result may occur with  improper specimen collection/handling, submission of specimen other than nasopharyngeal swab, presence of viral mutation(s) within the areas targeted by this assay, and inadequate number of viral copies(<138 copies/mL). A negative result must be combined with clinical observations, patient history, and epidemiological information. The expected result is Negative.  Fact Sheet for Patients:  EntrepreneurPulse.com.au  Fact Sheet for Healthcare Providers:  IncredibleEmployment.be  This test is no t yet approved or cleared by the [place] FDA and  has been authorized for detection and/or diagnosis of SARS-CoV-2 by FDA under an Emergency Use Authorization (EUA). This EUA will remain  in effect (meaning this test can be used) for the duration of the COVID-19 declaration under Section 564(b)(1) of the Act, 21 U.S.C.section 360bbb-3(b)(1), unless the authorization is terminated  or revoked sooner.       Influenza A by PCR NEGATIVE NEGATIVE Final   Influenza B by PCR NEGATIVE NEGATIVE Final    Comment: (NOTE) The Xpert Xpress SARS-CoV-2/FLU/RSV  plus assay is intended as an aid in the diagnosis of influenza from Nasopharyngeal swab specimens and should not be used as a sole basis for treatment. Nasal washings and aspirates are unacceptable for Xpert Xpress SARS-CoV-2/FLU/RSV testing.  Fact Sheet for Patients: EntrepreneurPulse.com.au  Fact Sheet for Healthcare  Providers: IncredibleEmployment.be  This test is not yet approved or cleared by the [place] FDA and has been authorized for detection and/or diagnosis of SARS-CoV-2 by FDA under an Emergency Use Authorization (EUA). This EUA will remain in effect (meaning this test can be used) for the duration of the COVID-19 declaration under Section 564(b)(1) of the Act, 21 U.S.C. section 360bbb-3(b)(1), unless the authorization is terminated or revoked.  Performed at [facility], [place] [address] [place], [place] [address]   Urine Culture     Status: Abnormal   Collection Time: 10/07/21  2:00 AM   Specimen: In/Out Cath Urine  Result Value Ref Range Status   Specimen Description   Final    IN/OUT CATH URINE Performed at [facility] [address], [place], [place] [address]    Special Requests   Final    NONE Performed at [facility], [place] [address] [place], [place] [address]    Culture 20,000 COLONIES/mL ESCHERICHIA COLI (A)  Final   Report Status 10/09/2021 FINAL  Final   Organism ID, Bacteria ESCHERICHIA COLI (A)  Final      Susceptibility   Escherichia coli - MIC*    AMPICILLIN <=2 SENSITIVE Sensitive     CEFAZOLIN <=4 SENSITIVE Sensitive     CEFEPIME <=0.12 SENSITIVE Sensitive     CEFTRIAXONE <=0.25 SENSITIVE Sensitive     CIPROFLOXACIN <=0.25 SENSITIVE Sensitive     GENTAMICIN <=1 SENSITIVE Sensitive     IMIPENEM <=0.25 SENSITIVE Sensitive     NITROFURANTOIN <=16 SENSITIVE Sensitive     TRIMETH/SULFA <=20 SENSITIVE Sensitive     AMPICILLIN/SULBACTAM <=2 SENSITIVE Sensitive     PIP/TAZO <=4 SENSITIVE Sensitive     * 20,000 COLONIES/mL ESCHERICHIA COLI  MRSA Next Gen by PCR, Nasal     Status: Abnormal   Collection Time: 10/07/21  5:00 AM   Specimen: Nasal Mucosa; Nasal Swab  Result Value Ref Range Status   MRSA by PCR Next Gen DETECTED (A) NOT DETECTED Final    Comment: RESULT CALLED TO, READ  BACK BY AND VERIFIED WITH: ROBINSON, A.  RN ON 10/07/2021 @ 0916 BY MECIAL J. (NOTE) The GeneXpert MRSA Assay (FDA approved for NASAL specimens only), is one component of a comprehensive MRSA colonization surveillance program. It is not intended to diagnose MRSA infection nor to guide or monitor treatment for MRSA infections. Test performance is not FDA approved in patients less than 21 years old. Performed at [facility], [place] [address], [place], [place] [address]     Radiology Studies: No results found.  Scheduled Meds:  (feeding supplement) PROSource Plus  30 mL Oral QID   ALPRAZolam  2 mg Oral TID   vitamin C  500 mg Oral BID   bictegravir-emtricitabine-tenofovir AF  1 tablet Oral Daily   chlorhexidine  15 mL Mouth Rinse BID   Chlorhexidine Gluconate Cloth  6 each Topical Q0600   escitalopram  20 mg Oral Daily   folic acid  1 mg Oral Daily   ipratropium-albuterol  3 mL Nebulization BID   lactulose  10 g Oral TID   lurasidone  120 mg Oral Daily   mouth rinse  15 mL Mouth Rinse q12n4p   midodrine  5 mg Oral TID WC   multivitamin with minerals  1 tablet Oral Daily   mupirocin ointment   Nasal BID   nicotine  14 mg Transdermal Daily   pantoprazole  40 mg Oral BID   potassium chloride  40 mEq Oral Daily   sodium chloride flush  10-40 mL Intracatheter Q12H   tetracycline  500 mg Oral BID   thiamine injection  100 mg Intravenous Daily   thiamine  250 mg Oral Daily   valACYclovir  1,000 mg Oral Daily   zinc sulfate  220 mg Oral Daily   Continuous Infusions:  sodium chloride Stopped (10/14/21 0548)   sodium chloride Stopped (10/12/21 0936)   dextrose 30 mL/hr at 10/16/21 0900   dextrose 5 % and 0.45 % NaCl with KCl 40 mEq/L 75 mL/hr at 10/16/21 0900   famotidine (PEPCID) IV 20 mg (10/16/21 1030)     LOS: 9 days    Time spent: 35 mins    Gussie Murton, MD [facility]   If 7PM-7AM, please contact night-coverage

## 2021-10-17 DIAGNOSIS — Z515 Encounter for palliative care: Secondary | ICD-10-CM | POA: Diagnosis not present

## 2021-10-17 DIAGNOSIS — A419 Sepsis, unspecified organism: Secondary | ICD-10-CM | POA: Diagnosis not present

## 2021-10-17 DIAGNOSIS — R6521 Severe sepsis with septic shock: Secondary | ICD-10-CM | POA: Diagnosis not present

## 2021-10-17 LAB — GLUCOSE, CAPILLARY
Glucose-Capillary: 86 mg/dL (ref 70–99)
Glucose-Capillary: 95 mg/dL (ref 70–99)
Glucose-Capillary: 87 mg/dL (ref 70–99)
Glucose-Capillary: 82 mg/dL (ref 70–99)

## 2021-10-17 LAB — CBC
HCT: 27.7 % — ABNORMAL LOW (ref 39.0–52.0)
Hemoglobin: 8.1 g/dL — ABNORMAL LOW (ref 13.0–17.0)
MCH: 30.6 pg (ref 26.0–34.0)
MCHC: 29.2 g/dL — ABNORMAL LOW (ref 30.0–36.0)
MCV: 104.5 fL — ABNORMAL HIGH (ref 80.0–100.0)
Platelets: 118 10*3/uL — ABNORMAL LOW (ref 150–400)
RBC: 2.65 MIL/uL — ABNORMAL LOW (ref 4.22–5.81)
RDW: 21.5 % — ABNORMAL HIGH (ref 11.5–15.5)
WBC: 10.9 10*3/uL — ABNORMAL HIGH (ref 4.0–10.5)
nRBC: 0 % (ref 0.0–0.2)

## 2021-10-17 LAB — BASIC METABOLIC PANEL
Anion gap: <3 — ABNORMAL LOW (ref 5–15)
BUN: 12 mg/dL (ref 6–20)
CO2: 26 mmol/L (ref 22–32)
Calcium: 7.9 mg/dL — ABNORMAL LOW (ref 8.9–10.3)
Chloride: 115 mmol/L — ABNORMAL HIGH (ref 98–111)
Creatinine, Ser: 0.66 mg/dL (ref 0.61–1.24)
GFR, Estimated: >60 mL/min (ref >60)
Glucose, Bld: 104 mg/dL — ABNORMAL HIGH (ref 70–99)
Potassium: 4 mmol/L (ref 3.5–5.1)
Sodium: 141 mmol/L (ref 135–145)

## 2021-10-17 LAB — PHOSPHORUS: Phosphorus: 3.1 mg/dL (ref 2.5–4.6)

## 2021-10-17 LAB — MAGNESIUM: Magnesium: 1.6 mg/dL — ABNORMAL LOW (ref 1.7–2.4)

## 2021-10-17 MED ORDER — MAGNESIUM SULFATE 2 GM/50ML IV SOLN
2.0000 g | Freq: Once | INTRAVENOUS | Status: AC
  Administered 2021-10-17: 2 g via INTRAVENOUS

## 2021-10-17 MED ORDER — HALOPERIDOL LACTATE 5 MG/ML IJ SOLN
2.0000 mg | Freq: Once | INTRAMUSCULAR | Status: AC
  Administered 2021-10-17: 2 mg via INTRAVENOUS
  Filled 2021-10-17: qty 1

## 2021-10-17 NOTE — Progress Notes (Signed)
Daily Progress Note   Patient Name: [name]       Date: 10/17/2021 DOB: [birth year]  Age: 36 y.o. MRN#: [id number] Attending Physician: Shawna Clamp, MD Primary Care Physician: [name], MD Admit Date: 10/06/2021  Reason for Consultation/Follow-up: Establishing goals of care  Subjective:  [name] is resting in bed, with covers drawn, episodic agitation continues.   Length of Stay: 10  Current Medications: Scheduled Meds:   (feeding supplement) PROSource Plus  30 mL Oral QID   ALPRAZolam  2 mg Oral TID   vitamin C  500 mg Oral BID   bictegravir-emtricitabine-tenofovir AF  1 tablet Oral Daily   chlorhexidine  15 mL Mouth Rinse BID   Chlorhexidine Gluconate Cloth  6 each Topical Q0600   escitalopram  20 mg Oral Daily   folic acid  1 mg Oral Daily   ipratropium-albuterol  3 mL Nebulization BID   lactulose  10 g Oral TID   lurasidone  120 mg Oral Daily   mouth rinse  15 mL Mouth Rinse q12n4p   midodrine  5 mg Oral TID WC   multivitamin with minerals  1 tablet Oral Daily   mupirocin ointment   Nasal BID   nicotine  14 mg Transdermal Daily   OLANZapine zydis  5 mg Oral QHS   pantoprazole  40 mg Oral BID   potassium chloride  40 mEq Oral Daily   sodium chloride flush  10-40 mL Intracatheter Q12H   tetracycline  500 mg Oral BID   thiamine injection  100 mg Intravenous Daily   thiamine  250 mg Oral Daily   valACYclovir  1,000 mg Oral Daily   zinc sulfate  220 mg Oral Daily    Continuous Infusions:  sodium chloride Stopped (10/14/21 0548)   sodium chloride Stopped (10/12/21 0936)   dextrose 50 mL/hr at 10/17/21 1238   dextrose 5 % and 0.45 % NaCl with KCl 40 mEq/L 75 mL/hr at 10/17/21 1238   famotidine (PEPCID) IV Stopped (10/17/21  1128)    PRN Meds: albuterol, docusate sodium, food thickener, lip balm, polyethylene glycol, sodium chloride flush  Physical Exam         General: awake alert no distress Has regular work of breathing Appears chronically ill Not agitated Monitor noted   doesn't interact   Vital  Signs: BP 107/70    Pulse 77    Temp (!) 97.5 F (36.4 C) (Axillary)    Resp 15    Ht 6' 3"  (1.905 m)    Wt 110.7 kg    SpO2 (!) 87%    BMI 30.50 kg/m  SpO2: SpO2: (!) 87 % O2 Device: O2 Device: Room Air O2 Flow Rate: O2 Flow Rate (L/min): 2 L/min  Intake/output summary:  Intake/Output Summary (Last 24 hours) at 10/17/2021 1256 Last data filed at 10/17/2021 1238 Gross per 24 hour  Intake 2970.57 ml  Output 1247 ml  Net 1723.57 ml    LBM: Last BM Date: 10/17/21 Baseline Weight: Weight: 108.9 kg Most recent weight: Weight: 110.7 kg       Palliative Assessment/Data:    Flowsheet Rows    Flowsheet Row Most Recent Value  Intake Tab   Referral Department Critical care  Unit at Time of Referral ICU  Palliative Care Primary Diagnosis Sepsis/Infectious Disease  Date Notified 10/09/21  Palliative Care Type New Palliative care  Reason for referral Clarify Goals of Care  Date of Admission 10/06/21  Date first seen by Palliative Care 10/10/21  # of days Palliative referral response time 1 Day(s)  # of days IP prior to Palliative referral 3  Clinical Assessment   Palliative Performance Scale Score 30%  Psychosocial & Spiritual Assessment   Palliative Care Outcomes   Patient/Family meeting held? Yes  Who was at the meeting? Patient, mother via phone       Patient Active Problem List   Diagnosis Date Noted   Palliative care by specialist    DNR (do not resuscitate) 10/14/2021   Aspiration pneumonia ([place]) 10/14/2021   FTT (failure to thrive) in adult 10/14/2021   Dysphagia 10/14/2021   Coagulopathy ([place]) 10/14/2021   Protein-calorie malnutrition, severe ([place]) 10/09/2021   HIV  positive ([place]) 10/07/2021   Avascular necrosis of femoral head, left ([place]) 10/07/2021   Hepatic steatosis 10/07/2021   Cellulitis of multiple sites of buttock 10/07/2021   Hypotension    Hypoxia 08/24/2021   Left hip pain    Transaminitis    Acute metabolic encephalopathy 62/70/3500   Cellulitis 05/07/2021   Lymphadenopathy 09/29/2020   Anemia    Upper urinary tract infection    Sepsis secondary to UTI ([place]) 03/14/2020   Cellulitis of groin 03/14/2020   Elevated LFTs 03/14/2020   HIV disease ([place])    Acute encephalopathy    Macrocytic anemia    Thrombocytopenia ([facility])    Prolonged QT interval    Acute respiratory failure due to COVID-19 ([place]) 10/27/2019   GERD (gastroesophageal reflux disease) 10/27/2019   Hypokalemia 10/27/2019   Peripheral neuropathy 10/01/2019   PTSD (post-traumatic stress disorder) 07/23/2018   Dizziness and giddiness 02/01/2016   Migraine headache 02/01/2016   Schizoaffective disorder, depressive type ([place]) 05/13/2015   Severe alcohol dependence ([place]) 05/13/2015   Suicidal ideation 01/12/2014    Palliative Care Assessment & Plan   Patient Profile: 36 y.o. male  with past medical history of HIV, bipolar, depression, hypertension, migraine, schizoaffective disorder, seizures admitted on 10/06/2021 with hypothermia and concern for infected wounds.  At time of admission, he was started on Zosyn and vancomycin.  Labs revealed hypokalemia and elevated lactate.  Fluid resuscitation started.  He is remained very weak and continues to have significant dysphagia.  NG tube was placed to dysphagia.  Palliative consulted for goals of care.  Recommendations/Plan: DNR DNI Monitor PO intake and hospital course Recommend  SNF  rehab with palliative one behaviors better controlled.  Now on  low dose Olanzapine, recommend psychiatry input, discussed with TRH MD.   Goals of Care and Additional Recommendations: Limitations on Scope of  Treatment: Full Scope Treatment  Code Status:    Code Status Orders  (From admission, onward)           Start     Ordered   10/07/21 0225  Full code  Continuous        10/07/21 0228           Code Status History     Date Active Date Inactive Code Status Order ID Comments User Context   08/24/2021 0200 09/01/2021 0258 Full Code [id number]  [name], MD ED   05/07/2021 1912 05/19/2021 1841 Full Code [id number]  [name], MD ED   03/15/2020 0606 03/20/2020 1650 Full Code [id number]  [name], MD ED   10/27/2019 1815 11/02/2019 1902 Full Code [id number]  [name], MD Inpatient   07/22/2018 1722 07/22/2018 2158 Full Code [id number]  [name], NP Inpatient   07/22/2018 0832 07/22/2018 1703 Full Code [id number]  [name], MD ED   05/12/2015 1753 05/15/2015 1723 Full Code [id number]  [name], NP Inpatient   05/12/2015 0546 05/12/2015 1753 Full Code [id number]  [name], MD ED   01/12/2014 0256 01/12/2014 1650 Full Code [id number]  [name], MD ED       Prognosis:  Unable to determine  Discharge Planning: To Be Determined  Care plan was discussed with Dr Dwyane Dee.   Thank you for allowing the Palliative Medicine Team to assist in the care of this patient.   Time In: 12 Time Out: 12.25 Total Time 25 Prolonged Time Billed No   Loistine Chance, MD  Please contact Palliative Medicine Team phone at [phone] for questions and concerns.

## 2021-10-17 NOTE — Progress Notes (Signed)
PROGRESS NOTE    [name]  RKY:[id number] DOB: 02-May-1986 DOA: 10/06/2021 PCP: [name], MD    Brief Narrative:  This 36 years old African-American male with PMH significant for tobacco abuse, HIV+ , bipolar disorder, hypertension, migraine, peripheral neuropathy, PTSD, schizoaffective disorder, seizure disorder presented in the ED with complaints of sores on his buttocks.  Patient had a visit from home health services who raised the concern that his sores have become infected.  Patient was noted to be hypothermic on arrival.  Labs in the ER showed lactic acid 6.4, potassium 2.7, abnormal LFTs.  Patient was hypotensive requiring pressor support.  Patient was agitated and restless requiring Precedex.  Subsequently patient was intubated and admitted in the ICU.  He is now off pressors, off Precedex drip. Septic shock resolved.  Patient continued to remain confused and has no capacity to make decisions. Palliative care consulted to discuss goals of care.  Mother is understanding that he has poor prognosis.  She is receptive to consider the options of hospice. PCCM pickup 10/15/2021.  Patient is currently on room air, he is on comfort feeds,  he has a Foley catheter, remains afebrile, He is very deconditioned. Plan: Continue goals of care discussion with palliative care, transition to comfort care.  Assessment & Plan:   Active Problems:   Schizoaffective disorder, depressive type (HCC)   Hypokalemia   Elevated LFTs   Acute encephalopathy   Macrocytic anemia   Thrombocytopenia (HCC)   Prolonged QT interval   HIV positive (HCC)   Avascular necrosis of femoral head, left (HCC)   Hepatic steatosis   Cellulitis of multiple sites of buttock   Protein-calorie malnutrition, severe (HCC)   DNR (do not resuscitate)   Aspiration pneumonia ([place])   FTT (failure to thrive) in adult   Dysphagia   Coagulopathy ([place])   Palliative care by specialist  Septic shock sec. to E. coli UTI /  LE  ulcerations: Patient completed course of antibiotics on 10/15/2021. Sepsis physiology resolved, Continue midodrine. Continue Foley catheter given difficult insertion. Consider voiding trial when patient becomes more ambulatory.  Acute on chronic metabolic encephalopathy: Complicated by bipolar disorder, schizoaffective, EtOH abuse, opioid dependence and PTSD, He is off Precedex drip now. Avoid antipsychotics such as Haldol or Seroquel given QTC issues Continue Lexapro and Latuda Continue Xanax, Continue lactulose, Back to baseline mental status with intermittent agitation and restlessness requiring antipsychotics. Palliative care started Zyprexa ODT as needed.  Chronic hypotension : Completed stress dose steroids. Continue midodrine 5 mg 3 times daily.  Epistaxis: > Improved. Suspect bleeding from repeated NG tube insertions in context of thrombocytopenia and coagulopathy. Continue aspiration precautions.  Acute on chronic dysphagia: Patient failed dysphagia 3 diet, He continued to pull NG tube even when heavily sedated. Speech consulted recommended MBS. Continue sips with direct supervision. Speech recommended thick liquids.  EtOH cirrhosis: Chronic elevated ammonia: Continue lactulose.  HIV: Continue Biktarvy.  Mild coagulopathy: Suspect due to chronic liver disease.  Chronic anemia / thrombocytopenia: Could be due to EtOH abuse. S/p 2 units of platelets and 3 unit PRBC. Hemoglobin stable, Continue to hold anticoagulation given hemoglobin drop.  Continue SCDs.  Severe deconditioning with severe fragility Failure to thrive and protein calorie malnutrition: Patient is seen by palliative care. Maximize nutrition, needs long-term placement. Continue comfort feeds now.  Open wound right upper arm:  Multiple scarring from prior ulceration Continue Xeroform gauze dressing,   Continue tetracycline    DVT prophylaxis: SCDs Code Status: DNR Family Communication:  No  family at bed side. Disposition Plan:   Status is: Inpatient  Remains inpatient appropriate because:  Admitted for septic shock which is now resolved. Patient has completed course of antibiotics.   Patient will need comfort feeds and possible transition to hospice care.  Anticipated discharge to skilled nursing facility with palliative following.  Consultants:  PCCM, urology  Procedures:  Antimicrobials:  . Anti-infectives (From admission, onward)    Start     Dose/Rate Route Frequency Ordered Stop   10/14/21 1000  valACYclovir (VALTREX) tablet 1,000 mg        1,000 mg Oral Daily 10/13/21 1152     10/13/21 2000  tetracycline (SUMYCIN) capsule 500 mg        500 mg Oral 2 times daily 10/13/21 1152     10/12/21 0930  tetracycline (SUMYCIN) capsule 500 mg  Status:  Discontinued        500 mg Per Tube 2 times daily 10/12/21 0918 10/13/21 1152   10/10/21 1000  valACYclovir (VALTREX) tablet 1,000 mg  Status:  Discontinued        1,000 mg Per Tube Daily 10/09/21 1344 10/13/21 1152   10/09/21 2000  cefTRIAXone (ROCEPHIN) 2 g in sodium chloride 0.9 % 100 mL IVPB        2 g 200 mL/hr over 30 Minutes Intravenous Every 24 hours 10/09/21 1705 10/15/21 2143   10/07/21 1800  ceFEPIme (MAXIPIME) 2 g in sodium chloride 0.9 % 100 mL IVPB  Status:  Discontinued        2 g 200 mL/hr over 30 Minutes Intravenous Every 8 hours 10/07/21 0954 10/09/21 1705   10/07/21 1230  bictegravir-emtricitabine-tenofovir AF (BIKTARVY) 50-200-25 MG per tablet 1 tablet        1 tablet Oral Daily 10/07/21 1138     10/07/21 1200  metroNIDAZOLE (FLAGYL) IVPB 500 mg  Status:  Discontinued        500 mg 100 mL/hr over 60 Minutes Intravenous Every 12 hours 10/07/21 0951 10/08/21 1601   10/07/21 1200  vancomycin (VANCOREADY) IVPB 1750 mg/350 mL  Status:  Discontinued        1,750 mg 175 mL/hr over 120 Minutes Intravenous Every 12 hours 10/07/21 0958 10/09/21 1705   10/07/21 1100  ceFEPIme (MAXIPIME) 2 g in sodium chloride  0.9 % 100 mL IVPB        2 g 200 mL/hr over 30 Minutes Intravenous  Once 10/07/21 0952 10/07/21 1236   10/07/21 1000  valACYclovir (VALTREX) tablet 1,000 mg  Status:  Discontinued        1,000 mg Oral Daily 10/07/21 0232 10/09/21 1344   10/06/21 2000  piperacillin-tazobactam (ZOSYN) IVPB 3.375 g        3.375 g 100 mL/hr over 30 Minutes Intravenous  Once 10/06/21 1954 10/06/21 2238   10/06/21 2000  vancomycin (VANCOREADY) IVPB 2000 mg/400 mL        2,000 mg 200 mL/hr over 120 Minutes Intravenous  Once 10/06/21 1955 10/06/21 2350       Subjective: Patient was seen and examined at bedside.  Overnight events noted. Patient underwent modified barium swallow and was recommended thick liquids.   He seems reasonably improved but develops Intermittent agitation and restlessness requiring antipsychotics.  Objective: Vitals:   10/17/21 0700 10/17/21 0759 10/17/21 0800 10/17/21 0803  BP: (!) 122/97  124/86   Pulse: (!) 54  70   Resp: 15  14   Temp:    (!) 97.5 F (36.4 C)  TempSrc:  Oral  SpO2:  96% 99%   Weight:      Height:        Intake/Output Summary (Last 24 hours) at 10/17/2021 1039 Last data filed at 10/17/2021 0900 Gross per 24 hour  Intake 2716.83 ml  Output 1547 ml  Net 1169.83 ml   Filed Weights   10/15/21 0500 10/16/21 0623 10/17/21 0500  Weight: 109.4 kg 110.7 kg 110.7 kg    Examination:  General exam: Appears comfortable, chronically ill looking, deconditioned, not in any distress. Respiratory system: Decreased breath sounds bilaterally, no accessory muscle use, respiratory effort normal, RR 15 Cardiovascular system: S1-S2 heard, regular rate and rhythm, no murmur. Gastrointestinal system: Abdomen is soft, nontender, nondistended, BS+ Central nervous system: Alert and oriented X 2. No focal neurological deficits. Extremities: Edema+ , no cyanosis, no clubbing. Skin: Multiple open wounds on the right arm, lower extremities Psychiatry: Mood & affect appropriate.      Data Reviewed: I have personally reviewed following labs and imaging studies  CBC: Recent Labs  Lab 10/13/21 0416 10/14/21 0420 10/14/21 1849 10/15/21 0354 10/16/21 0443 10/17/21 0500  WBC 10.7* 11.7*  --  14.3* 11.5* 10.9*  NEUTROABS  --  7.4  --   --   --   --   HGB 7.0* 5.9* 8.3* 7.8* 8.2* 8.1*  HCT 23.8* 19.2* 27.1* 26.1* 27.7* 27.7*  MCV 106.7* 105.5*  --  99.2 102.2* 104.5*  PLT 55* 67*  --  95* 105* 256*   Basic Metabolic Panel: Recent Labs  Lab 10/13/21 0416 10/13/21 1700 10/14/21 0420 10/15/21 0354 10/16/21 0443 10/17/21 0813  NA 144  --  147* 148* 146* 141  K 3.9  --  3.3* 3.8 4.0 4.0  CL 112*  --  115* 116* 116* 115*  CO2 26  --  28 27 27 26   GLUCOSE 164*  --  160* 138* 94 104*  BUN 31*  --  29* 21* 14 12  CREATININE 1.02  --  1.03 0.91 0.77 0.66  CALCIUM 8.5*  --  8.5* 8.6* 8.4* 7.9*  MG 2.6* 2.1 2.2  --  1.6* 1.6*  PHOS 2.2* 2.4* 2.5  --  3.3 3.1   GFR: Estimated Creatinine Clearance: 169.9 mL/min (by C-G formula based on SCr of 0.66 mg/dL). Liver Function Tests: Recent Labs  Lab 10/11/21 0620 10/12/21 0500 10/13/21 0416 10/14/21 0420  AST 23 31 43* 36  ALT 32 32 42 40  ALKPHOS 234* 271* 332* 300*  BILITOT 1.2 1.0 0.9 0.8  PROT 6.1* 5.9* 6.5 5.8*  ALBUMIN <1.5* <1.5* <1.5* <1.5*   No results for input(s): LIPASE, AMYLASE in the last 168 hours. Recent Labs  Lab 10/10/21 1054  AMMONIA 123*   Coagulation Profile: Recent Labs  Lab 10/12/21 0500 10/14/21 1141  INR 1.5* 1.2   Cardiac Enzymes: No results for input(s): CKTOTAL, CKMB, CKMBINDEX, TROPONINI in the last 168 hours. BNP (last 3 results) No results for input(s): PROBNP in the last 8760 hours. HbA1C: No results for input(s): HGBA1C in the last 72 hours. CBG: Recent Labs  Lab 10/16/21 1607 10/16/21 2000 10/16/21 2318 10/17/21 0444 10/17/21 0743  GLUCAP 84 107* 93 86 95   Lipid Profile: No results for input(s): CHOL, HDL, LDLCALC, TRIG, CHOLHDL, LDLDIRECT in the  last 72 hours. Thyroid Function Tests: No results for input(s): TSH, T4TOTAL, FREET4, T3FREE, THYROIDAB in the last 72 hours. Anemia Panel: No results for input(s): VITAMINB12, FOLATE, FERRITIN, TIBC, IRON, RETICCTPCT in the last 72 hours. Sepsis  Labs: No results for input(s): PROCALCITON, LATICACIDVEN in the last 168 hours.   No results found for this or any previous visit (from the past 240 hour(s)).   Radiology Studies: No results found.  Scheduled Meds:  (feeding supplement) PROSource Plus  30 mL Oral QID   ALPRAZolam  2 mg Oral TID   vitamin C  500 mg Oral BID   bictegravir-emtricitabine-tenofovir AF  1 tablet Oral Daily   chlorhexidine  15 mL Mouth Rinse BID   Chlorhexidine Gluconate Cloth  6 each Topical Q0600   escitalopram  20 mg Oral Daily   folic acid  1 mg Oral Daily   ipratropium-albuterol  3 mL Nebulization BID   lactulose  10 g Oral TID   lurasidone  120 mg Oral Daily   mouth rinse  15 mL Mouth Rinse q12n4p   midodrine  5 mg Oral TID WC   multivitamin with minerals  1 tablet Oral Daily   mupirocin ointment   Nasal BID   nicotine  14 mg Transdermal Daily   OLANZapine zydis  5 mg Oral QHS   pantoprazole  40 mg Oral BID   potassium chloride  40 mEq Oral Daily   sodium chloride flush  10-40 mL Intracatheter Q12H   tetracycline  500 mg Oral BID   thiamine injection  100 mg Intravenous Daily   thiamine  250 mg Oral Daily   valACYclovir  1,000 mg Oral Daily   zinc sulfate  220 mg Oral Daily   Continuous Infusions:  sodium chloride Stopped (10/14/21 0548)   sodium chloride Stopped (10/12/21 0936)   dextrose 50 mL/hr at 10/17/21 0900   dextrose 5 % and 0.45 % NaCl with KCl 40 mEq/L 75 mL/hr at 10/17/21 0900   famotidine (PEPCID) IV Stopped (10/16/21 2318)     LOS: 10 days    Time spent: 35 mins    Patrici Minnis, MD [facility]   If 7PM-7AM, please contact night-coverage

## 2021-10-17 NOTE — Progress Notes (Signed)
Patient has increased confusion, attempting to remove essential equipment and requesting to leave.  He was reoriented without success. Mitts were places to promote safety.   Will continue to monitor.

## 2021-10-18 DIAGNOSIS — R410 Disorientation, unspecified: Secondary | ICD-10-CM

## 2021-10-18 DIAGNOSIS — F32A Depression, unspecified: Secondary | ICD-10-CM

## 2021-10-18 DIAGNOSIS — Z66 Do not resuscitate: Secondary | ICD-10-CM | POA: Diagnosis not present

## 2021-10-18 DIAGNOSIS — A419 Sepsis, unspecified organism: Secondary | ICD-10-CM | POA: Diagnosis not present

## 2021-10-18 DIAGNOSIS — R6521 Severe sepsis with septic shock: Secondary | ICD-10-CM | POA: Diagnosis not present

## 2021-10-18 LAB — GLUCOSE, CAPILLARY
Glucose-Capillary: 92 mg/dL (ref 70–99)
Glucose-Capillary: 71 mg/dL (ref 70–99)
Glucose-Capillary: 80 mg/dL (ref 70–99)
Glucose-Capillary: 88 mg/dL (ref 70–99)
Glucose-Capillary: 91 mg/dL (ref 70–99)

## 2021-10-18 LAB — CBC
HCT: 30.9 % — ABNORMAL LOW (ref 39.0–52.0)
Hemoglobin: 9.4 g/dL — ABNORMAL LOW (ref 13.0–17.0)
MCH: 30.8 pg (ref 26.0–34.0)
MCHC: 30.4 g/dL (ref 30.0–36.0)
MCV: 101.3 fL — ABNORMAL HIGH (ref 80.0–100.0)
Platelets: 159 10*3/uL (ref 150–400)
RBC: 3.05 MIL/uL — ABNORMAL LOW (ref 4.22–5.81)
RDW: 21.2 % — ABNORMAL HIGH (ref 11.5–15.5)
WBC: 11.9 10*3/uL — ABNORMAL HIGH (ref 4.0–10.5)
nRBC: 0 % (ref 0.0–0.2)

## 2021-10-18 LAB — MAGNESIUM: Magnesium: 1.6 mg/dL — ABNORMAL LOW (ref 1.7–2.4)

## 2021-10-18 MED ORDER — MAGNESIUM SULFATE 2 GM/50ML IV SOLN
2.0000 g | Freq: Once | INTRAVENOUS | Status: AC
  Administered 2021-10-18: 2 g via INTRAVENOUS
  Filled 2021-10-18: qty 50

## 2021-10-18 MED ORDER — THIAMINE HCL 100 MG PO TABS
250.0000 mg | ORAL_TABLET | Freq: Every day | ORAL | Status: DC
  Administered 2021-10-23 – 2021-11-04 (×12): 250 mg via ORAL
  Filled 2021-10-18 (×12): qty 3

## 2021-10-18 MED ORDER — THIAMINE HCL 100 MG/ML IJ SOLN
400.0000 mg | Freq: Three times a day (TID) | INTRAVENOUS | Status: AC
  Administered 2021-10-18 – 2021-10-22 (×12): 400 mg via INTRAVENOUS
  Filled 2021-10-18 (×17): qty 4

## 2021-10-18 MED ORDER — ALPRAZOLAM 1 MG PO TABS
1.5000 mg | ORAL_TABLET | Freq: Three times a day (TID) | ORAL | Status: DC | PRN
  Administered 2021-10-19 – 2021-10-24 (×7): 1.5 mg via ORAL
  Filled 2021-10-18 (×7): qty 1

## 2021-10-18 NOTE — Progress Notes (Signed)
Daily Progress Note   Patient Name: [name]       Date: 10/18/2021 DOB: [birth year]  Age: 36 y.o. MRN#: [id number] Attending Physician: Shawna Clamp, MD Primary Care Physician: [name], MD Admit Date: 10/06/2021  Reason for Consultation/Follow-up: Establishing goals of care  Subjective:  [name] is resting in bed, with covers drawn, episodic agitation, reasonable PO intake.   Length of Stay: 11  Current Medications: Scheduled Meds:   (feeding supplement) PROSource Plus  30 mL Oral QID   ALPRAZolam  2 mg Oral TID   vitamin C  500 mg Oral BID   bictegravir-emtricitabine-tenofovir AF  1 tablet Oral Daily   chlorhexidine  15 mL Mouth Rinse BID   Chlorhexidine Gluconate Cloth  6 each Topical Q0600   escitalopram  20 mg Oral Daily   folic acid  1 mg Oral Daily   ipratropium-albuterol  3 mL Nebulization BID   lactulose  10 g Oral TID   lurasidone  120 mg Oral Daily   mouth rinse  15 mL Mouth Rinse q12n4p   midodrine  5 mg Oral TID WC   multivitamin with minerals  1 tablet Oral Daily   mupirocin ointment   Nasal BID   nicotine  14 mg Transdermal Daily   OLANZapine zydis  5 mg Oral QHS   pantoprazole  40 mg Oral BID   potassium chloride  40 mEq Oral Daily   sodium chloride flush  10-40 mL Intracatheter Q12H   tetracycline  500 mg Oral BID   thiamine injection  100 mg Intravenous Daily   thiamine  250 mg Oral Daily   valACYclovir  1,000 mg Oral Daily   zinc sulfate  220 mg Oral Daily    Continuous Infusions:  sodium chloride Stopped (10/14/21 0548)   sodium chloride Stopped (10/18/21 1131)   dextrose 50 mL/hr at 10/18/21 1305   dextrose 5 % and 0.45 % NaCl with KCl 40 mEq/L 75 mL/hr at 10/18/21 1305   famotidine (PEPCID) IV Stopped  (10/18/21 1202)    PRN Meds: albuterol, docusate sodium, food thickener, lip balm, polyethylene glycol, sodium chloride flush  Physical Exam         General: awake alert no distress Has regular work of breathing Appears chronically ill Not agitated Monitor noted   doesn't interact  Vital Signs: BP 116/83    Pulse 97    Temp (!) 97.4 F (36.3 C) (Oral)    Resp 14    Ht 6' 3"  (1.905 m)    Wt 113 kg    SpO2 98%    BMI 31.14 kg/m  SpO2: SpO2: 98 % O2 Device: O2 Device: Room Air O2 Flow Rate: O2 Flow Rate (L/min): 2 L/min  Intake/output summary:  Intake/Output Summary (Last 24 hours) at 10/18/2021 1329 Last data filed at 10/18/2021 1305 Gross per 24 hour  Intake 3144.41 ml  Output 2050 ml  Net 1094.41 ml    LBM: Last BM Date: 10/18/21 Baseline Weight: Weight: 108.9 kg Most recent weight: Weight: 113 kg       Palliative Assessment/Data:    Flowsheet Rows    Flowsheet Row Most Recent Value  Intake Tab   Referral Department Critical care  Unit at Time of Referral ICU  Palliative Care Primary Diagnosis Sepsis/Infectious Disease  Date Notified 10/09/21  Palliative Care Type New Palliative care  Reason for referral Clarify Goals of Care  Date of Admission 10/06/21  Date first seen by Palliative Care 10/10/21  # of days Palliative referral response time 1 Day(s)  # of days IP prior to Palliative referral 3  Clinical Assessment   Palliative Performance Scale Score 30%  Psychosocial & Spiritual Assessment   Palliative Care Outcomes   Patient/Family meeting held? Yes  Who was at the meeting? Patient, mother via phone       Patient Active Problem List   Diagnosis Date Noted   Palliative care by specialist    DNR (do not resuscitate) 10/14/2021   Aspiration pneumonia ([place]) 10/14/2021   FTT (failure to thrive) in adult 10/14/2021   Dysphagia 10/14/2021   Coagulopathy ([place]) 10/14/2021   Protein-calorie malnutrition, severe ([place]) 10/09/2021   HIV positive  ([place]) 10/07/2021   Avascular necrosis of femoral head, left ([place]) 10/07/2021   Hepatic steatosis 10/07/2021   Cellulitis of multiple sites of buttock 10/07/2021   Hypotension    Hypoxia 08/24/2021   Left hip pain    Transaminitis    Acute metabolic encephalopathy 83/41/9622   Cellulitis 05/07/2021   Lymphadenopathy 09/29/2020   Anemia    Upper urinary tract infection    Sepsis secondary to UTI ([place]) 03/14/2020   Cellulitis of groin 03/14/2020   Elevated LFTs 03/14/2020   HIV disease ([place])    Acute encephalopathy    Macrocytic anemia    Thrombocytopenia (H[facility])    Prolonged QT interval    Acute respiratory failure due to COVID-19 ([place]) 10/27/2019   GERD (gastroesophageal reflux disease) 10/27/2019   Hypokalemia 10/27/2019   Peripheral neuropathy 10/01/2019   PTSD (post-traumatic stress disorder) 07/23/2018   Dizziness and giddiness 02/01/2016   Migraine headache 02/01/2016   Schizoaffective disorder, depressive type ([place]) 05/13/2015   Severe alcohol dependence ([place]) 05/13/2015   Suicidal ideation 01/12/2014    Palliative Care Assessment & Plan   Patient Profile: 36 y.o. male  with past medical history of HIV, bipolar, depression, hypertension, migraine, schizoaffective disorder, seizures admitted on 10/06/2021 with hypothermia and concern for infected wounds.  At time of admission, he was started on Zosyn and vancomycin.  Labs revealed hypokalemia and elevated lactate.  Fluid resuscitation started.  He is remained very weak and continues to have significant dysphagia.  NG tube was placed to dysphagia.  Palliative consulted for goals of care.  Recommendations/Plan: DNR DNI Monitor PO intake and hospital course Recommend  SNF rehab  with palliative one behaviors better controlled.  Remains on low dose Olanzapine, recommend psychiatry input, discussed with TRH MD.   Goals of Care and Additional Recommendations: Limitations on Scope of Treatment:  Full Scope Treatment  Code Status:    Code Status Orders  (From admission, onward)           Start     Ordered   10/07/21 0225  Full code  Continuous        10/07/21 0228           Code Status History     Date Active Date Inactive Code Status Order ID Comments User Context   08/24/2021 0200 09/01/2021 0258 Full Code [id number]  [name], MD ED   05/07/2021 1912 05/19/2021 1841 Full Code [id number]  [name], MD ED   03/15/2020 0606 03/20/2020 1650 Full Code [id number]  [name], MD ED   10/27/2019 1815 11/02/2019 1902 Full Code [id number]  [name], MD Inpatient   07/22/2018 1722 07/22/2018 2158 Full Code [id number]  [name], NP Inpatient   07/22/2018 0832 07/22/2018 1703 Full Code [id number]  [name], MD ED   05/12/2015 1753 05/15/2015 1723 Full Code [id number]  [name], NP Inpatient   05/12/2015 0546 05/12/2015 1753 Full Code [id number]  [name], MD ED   01/12/2014 0256 01/12/2014 1650 Full Code [id number]  [name], MD ED       Prognosis:  Unable to determine  Discharge Planning: To Be Determined  Care plan was discussed with IDT   Thank you for allowing the Palliative Medicine Team to assist in the care of this patient.   Time In: 12 Time Out: 12.15 Total Time 15 Prolonged Time Billed No   Loistine Chance, MD  Please contact Palliative Medicine Team phone at [phone] for questions and concerns.

## 2021-10-18 NOTE — NC FL2 (Signed)
[place] MEDICAID FL2 LEVEL OF CARE SCREENING TOOL     IDENTIFICATION  Patient Name: [name] Birthdate: 11-07-1985 Sex: male Admission Date (Current Location): 10/06/2021  [place] and [place] Number:  Kathleen Argue [id number] [place] and Address:  [facility],  [employer] [place], [place]      Provider Number: [id number]  Attending Physician Name and Address:  Shawna Clamp, MD  Relative Name and Phone Number:  [place] Mother [phone]  [phone]  Hedy Jacob [phone]    Current Level of Care: Hospital Recommended Level of Care: [place] Prior Approval Number:    Date Approved/Denied:   PASRR Number: Pending  Discharge Plan: SNF    Current Diagnoses: Patient Active Problem List   Diagnosis Date Noted   Palliative care by specialist    DNR (do not resuscitate) 10/14/2021   Aspiration pneumonia ([place]) 10/14/2021   FTT (failure to thrive) in adult 10/14/2021   Dysphagia 10/14/2021   Coagulopathy ([place]) 10/14/2021   Protein-calorie malnutrition, severe ([place]) 10/09/2021   HIV positive ([place]) 10/07/2021   Avascular necrosis of femoral head, left ([place]) 10/07/2021   Hepatic steatosis 10/07/2021   Cellulitis of multiple sites of buttock 10/07/2021   Hypotension    Hypoxia 08/24/2021   Left hip pain    Transaminitis    Acute metabolic encephalopathy 24/58/0998   Cellulitis 05/07/2021   Lymphadenopathy 09/29/2020   Anemia    Upper urinary tract infection    Sepsis secondary to UTI ([place]) 03/14/2020   Cellulitis of groin 03/14/2020   Elevated LFTs 03/14/2020   HIV disease ([place])    Acute encephalopathy    Macrocytic anemia    Thrombocytopenia (HCC)    Prolonged QT interval    Acute respiratory failure due to COVID-19 ([place]) 10/27/2019   GERD (gastroesophageal reflux disease) 10/27/2019   Hypokalemia 10/27/2019   Peripheral neuropathy 10/01/2019   PTSD (post-traumatic stress disorder) 07/23/2018    Dizziness and giddiness 02/01/2016   Migraine headache 02/01/2016   Schizoaffective disorder, depressive type ([place]) 05/13/2015   Severe alcohol dependence ([place]) 05/13/2015   Suicidal ideation 01/12/2014    Orientation RESPIRATION BLADDER Height & Weight     Self, Situation, Place  Normal Incontinent Weight: 249 lb 1.9 oz (113 kg) Height:  6' 3"  (190.5 cm)  BEHAVIORAL SYMPTOMS/MOOD NEUROLOGICAL BOWEL NUTRITION STATUS      Incontinent Diet  AMBULATORY STATUS COMMUNICATION OF NEEDS Skin   Limited Assist Verbally Other (Comment) (Non pressure wound on thigh)                       Personal Care Assistance Level of Assistance  Bathing, Feeding, Dressing Bathing Assistance: Limited assistance Feeding assistance: Limited assistance Dressing Assistance: Limited assistance     Functional Limitations Info  Sight, Hearing, Speech Sight Info: Adequate Hearing Info: Adequate Speech Info: Adequate    SPECIAL CARE FACTORS FREQUENCY  PT (By licensed PT), OT (By licensed OT)     PT Frequency: Minimum 5x a week OT Frequency: Minimum 5x a week            Contractures Contractures Info: Not present    Additional Factors Info  Code Status, Allergies, Psychotropic, Isolation Precautions Code Status Info: DNR Allergies Info: Dapsone   Primaquine Phosphate Psychotropic Info: escitalopram (LEXAPRO) tablet 20 mg, OLANZapine zydis (ZYPREXA) disintegrating tablet 5 mg   Isolation Precautions Info: MRSA     Current Medications (10/18/2021):  This is the current hospital active medication list Current Facility-Administered Medications  Medication Dose Route Frequency Provider Last Rate Last Admin   (feeding supplement) PROSource Plus liquid 30 mL  30 mL Oral QID Hunsucker, Bonna Gains, MD   30 mL at 10/18/21 0826   0.9 %  sodium chloride infusion  250 mL Intravenous Continuous [name], MD   Stopped at 10/14/21 0548   0.9 %  sodium chloride infusion  250 mL Intravenous Continuous  [name], MD   Stopped at 10/18/21 1131   albuterol (PROVENTIL) (2.5 MG/3ML) 0.083% nebulizer solution 2.5 mg  2.5 mg Nebulization Q2H PRN [name], MD       ALPRAZolam Duanne Moron) tablet 1.5 mg  1.5 mg Oral TID PRN Cinderella, Margaret A       ascorbic acid (VITAMIN C) tablet 500 mg  500 mg Oral BID Hunsucker, Bonna Gains, MD   500 mg at 10/18/21 0827   bictegravir-emtricitabine-tenofovir AF (BIKTARVY) 50-200-25 MG per tablet 1 tablet  1 tablet Oral Daily Gerald Leitz D, NP   1 tablet at 10/18/21 0827   chlorhexidine (PERIDEX) 0.12 % solution 15 mL  15 mL Mouth Rinse BID Brand Males, MD   15 mL at 10/18/21 0825   Chlorhexidine Gluconate Cloth 2 % PADS 6 each  6 each Topical Q0600 Brand Males, MD   6 each at 10/18/21 1700   dextrose 10 % infusion   Intravenous Continuous Lovey Newcomer T, NP 50 mL/hr at 10/18/21 1719 New Bag at 10/18/21 1719   dextrose 5 % and 0.45 % NaCl with KCl 40 mEq/L infusion   Intravenous Continuous [name], NP 75 mL/hr at 10/18/21 1446 New Bag at 10/18/21 1446   docusate sodium (COLACE) capsule 100 mg  100 mg Oral BID PRN Hunsucker, Bonna Gains, MD       escitalopram (LEXAPRO) tablet 20 mg  20 mg Oral Daily Hunsucker, Bonna Gains, MD   20 mg at 10/18/21 0827   famotidine (PEPCID) IVPB 20 mg premix  20 mg Intravenous Q12H [name], MD   Stopped at 42/70/62 3762   folic acid (FOLVITE) tablet 1 mg  1 mg Oral Daily Hunsucker, Bonna Gains, MD   1 mg at 10/18/21 0827   food thickener (SIMPLYTHICK (NECTAR/LEVEL 2/MILDLY THICK)) 1 packet  1 packet Oral PRN [name], MD       ipratropium-albuterol (DUONEB) 0.5-2.5 (3) MG/3ML nebulizer solution 3 mL  3 mL Nebulization BID Brand Males, MD   3 mL at 10/18/21 0742   lactulose (CHRONULAC) 10 GM/15ML solution 10 g  10 g Oral TID Lanier Clam, MD   10 g at 10/16/21 2122   lip balm (CARMEX) ointment   Topical PRN [name], NP       MEDLINE mouth rinse  15 mL Mouth Rinse q12n4p  Brand Males, MD   15 mL at 10/18/21 1721   midodrine (PROAMATINE) tablet 5 mg  5 mg Oral TID WC Hunsucker, Bonna Gains, MD   5 mg at 10/18/21 1808   multivitamin with minerals tablet 1 tablet  1 tablet Oral Daily Hunsucker, Bonna Gains, MD   1 tablet at 10/18/21 0827   mupirocin ointment (BACTROBAN) 2 %   Nasal BID Brand Males, MD   Given at 10/18/21 8315   nicotine (NICODERM CQ - dosed in mg/24 hours) patch 14 mg  14 mg Transdermal Daily [name], MD   14 mg at 10/18/21 0837   OLANZapine zydis (ZYPREXA) disintegrating tablet 5 mg  5 mg Oral QHS Loistine Chance, MD  5 mg at 10/17/21 2219   pantoprazole (PROTONIX) EC tablet 40 mg  40 mg Oral BID [name], NP   40 mg at 10/18/21 6160   polyethylene glycol (MIRALAX / GLYCOLAX) packet 17 g  17 g Oral Daily PRN Hunsucker, Bonna Gains, MD       potassium chloride (KLOR-CON) packet 40 mEq  40 mEq Oral Daily Hunsucker, Bonna Gains, MD   40 mEq at 10/18/21 7371   sodium chloride flush (NS) 0.9 % injection 10-40 mL  10-40 mL Intracatheter Q12H Shawna Clamp, MD   10 mL at 10/18/21 0830   sodium chloride flush (NS) 0.9 % injection 10-40 mL  10-40 mL Intracatheter PRN Shawna Clamp, MD       tetracycline (SUMYCIN) capsule 500 mg  500 mg Oral BID Hunsucker, Bonna Gains, MD   500 mg at 10/18/21 0626   thiamine (B-1) 400 mg in sodium chloride 0.9 % 50 mL IVPB  400 mg Intravenous TID Salley Slaughter ON 10/23/2021] thiamine tablet 250 mg  250 mg Oral Daily Cinderella, Margaret A       valACYclovir (VALTREX) tablet 1,000 mg  1,000 mg Oral Daily Hunsucker, Bonna Gains, MD   1,000 mg at 10/18/21 9485   zinc sulfate capsule 220 mg  220 mg Oral Daily Hunsucker, Bonna Gains, MD   220 mg at 10/18/21 0827     Discharge Medications: Please see discharge summary for a list of discharge medications.  Relevant Imaging Results:  Relevant Lab Results:   Additional Information SSN [id number]  Ross Ludwig, LCSW

## 2021-10-18 NOTE — Consult Note (Signed)
[name]   Service Date: October 18, 2021 LOS:  LOS: 11 days    Assessment  [name] is a 36 y.o. male admitted medically for 10/06/2021  5:57 PM for sepsis d/t wounds on buttocks. [name] He carries the psychiatric diagnoses of reported PTSD, borderline personality disorder, and schizoaffective disorder and has a complex past medical history of  HIV/AIDs and sequelae. Psychiatry was consulted for confusion, restelessness, agitation, Depression. by Drew Clamp, MD.    His current presentation of waxing and waning mentation while severely ill in the hospital is most consistent with delirium.   Current outpatient psychotropic medications include lurasidone, escitaloparam, and alprazolam and historically he has had a fair response to these medications (has been able to stay outside of the psychiatric hospital, but has significant self-care deficits and is not well) . He was not compliant with medications prior to admission as evidenced by pt and brother report. On initial examination, patient did fairly well on formal attention testing and was able to participate in shared decision making regarding his psychiatric medications, although has minimal insight into the overall severity of his medical illness or physical debility. We will be reducing the dose of Xanax and making it PRN rather than scheduled (deliriogenic, disinhibiting), stopping latuda entirely (pt with hx of prolonged qt, no recent EKG, on multiple Qtc prolonging medications including most recently olanzapine from palliative care, questionable absorption in pt with minimal oral intake), and continuing escitalopram without changing it. Please see plan below for detailed recommendations.   Diagnoses:  Active Hospital problems: Active Problems:   Schizoaffective disorder, depressive type (HCC)   Hypokalemia   Elevated LFTs   Acute encephalopathy   Macrocytic anemia    Thrombocytopenia (HCC)   Prolonged QT interval   HIV positive (HCC)   Avascular necrosis of femoral head, left (HCC)   Hepatic steatosis   Cellulitis of multiple sites of buttock   Protein-calorie malnutrition, severe (HCC)   DNR (do not resuscitate)   Aspiration pneumonia ([place])   FTT (failure to thrive) in adult   Dysphagia   Coagulopathy ([place])   Palliative care by specialist    Problems edited/added by me: No problems updated.  Plan  ## Safety and Observation Level:  - Based on my clinical evaluation, I estimate the patient to be at low risk of self harm in the current setting - At this time, we recommend a routine level of observation. This decision is based on my review of the chart including patient's history and current presentation, interview of the patient, mental status examination, and consideration of suicide risk including evaluating suicidal ideation, plan, intent, suicidal or self-harm behaviors, risk factors, and protective factors. This judgment is based on our ability to directly address suicide risk, implement suicide prevention strategies and develop a safety plan while the patient is in the clinical setting. Please contact our team if there is a concern that risk level has changed.   ## Medications:  -- continue escitalopram 20 mg qD -- STOP lurasidone; continue olanzapine 5 QHS  -- would use olanzapine 5 PRN up to twice daily for agitation if needed -- DECREASE alprazolam from 2 TID scheduled to 1.5 TID PRN   ## Medical Decision Making Capacity:  Not formally assessed  ## Further Work-up:  -- repeat EKG, has had many doses of tetracycline and started on olanzapine since last EKG    -- most recent EKG on 1/5 had QtC of 459; had had prolonged  qtc with significant bradycardia this admission -- Pertinent labwork reviewed earlier this admission includes: most recent TSH WNL 11/22, folate low/nl (5.9) 11/22, B12 supratherapeutic 11/22  ## Disposition:  -- per  primary team   Thank you for this consult request. Recommendations have been communicated to the primary team.  We will continue to follow at this time.   [name]   New history  Relevant Aspects of Hospital Course:  Admitted on 10/06/2021 for sepsis after presenting to the ED with infected sores on his buttocks; required ICU admission during which he became agitated, restless, got precedex, and was intubated. He has been intermittently confused throughout hospitalization.   Patient Report:  Patient seen in room; brother remains in room with pt consent.   He is alert and oriented to self, location, month and year. He is only partially oriented to situation (thinks he is in the hospital because his "blood pressure is too high") and through conversation demonstrates markedly poor insight into the severity of his condition. Attention is poor; able to complete 1/2 of DOWB.  He reports that his mood has been poor lately, but overall feels his medication regimen is working fairly well in that it has kept him out of the hospital. He endorses intermittent compliance with his medication (brother behind him is shaking his head no). He states that he ate a good breakfast (discussed with nurse -ate almost nothing). Lunch untouched at time of my exam. I discussed concerns of confusion, medication absorption, and risk/benefits of therapeutic overlap with zyprexa and latuda with pt and brother in the room; he is agreeable to med changes as described above. He does not endorse any SI or HI for at least the last several years (last hospital stay in 2019 also notes concerns about pt taking latuda with appropriate # of calories) and endorsed 1-2 episodes of AH/VH over past several months.   ROS:  Cough  Collateral information:  Brother in room  Psychiatric History:  Information collected from pt, brother  Hx of borderline personality disorder, schizoaffective disorder vs bipolar disorder,  depression, PTSD  Social History:  Lives alone  Tobacco use: yes   Family History:  The patient's family history includes Alcohol abuse in his father, mother, paternal uncle, and paternal uncle; Depression in his father; Schizophrenia in his father.  Medical History: Past Medical History:  Diagnosis Date   Bipolar 1 disorder ([place])    Depression    Dizziness and giddiness 02/01/2016   Herpes genitalia    HIV disease ([place])    Hypertension    Hyponatremia    Hypothermia 08/24/2021   Migraine headache 02/01/2016   Peripheral neuropathy 10/01/2019   PTSD (post-traumatic stress disorder)    Schizoaffective disorder ([place])    Seizures ([place])     Surgical History: Past Surgical History:  Procedure Laterality Date   BACK SURGERY     BIOPSY  02/26/2021   Procedure: BIOPSY;  Surgeon: Otis Brace, MD;  Location: [facility];  Service: Gastroenterology;;   COLONOSCOPY WITH PROPOFOL [name] 02/26/2021   Procedure: COLONOSCOPY WITH PROPOFOL;  Surgeon: Otis Brace, MD;  Location: [facility];  Service: Gastroenterology;  Laterality: [name];   ESOPHAGOGASTRODUODENOSCOPY (EGD) WITH PROPOFOL [name] 02/26/2021   Procedure: ESOPHAGOGASTRODUODENOSCOPY (EGD) WITH PROPOFOL;  Surgeon: Otis Brace, MD;  Location: [facility];  Service: Gastroenterology;  Laterality: [name];   ESOPHAGOGASTRODUODENOSCOPY (EGD) WITH PROPOFOL [name] 05/18/2021   Procedure: ESOPHAGOGASTRODUODENOSCOPY (EGD) WITH PROPOFOL;  Surgeon: Otis Brace, MD;  Location: [facility];  Service: Gastroenterology;  Laterality: [name];   HAND SURGERY      Medications:   Current Facility-Administered Medications:    (feeding supplement) PROSource Plus liquid 30 mL, 30 mL, Oral, QID, Hunsucker, Bonna Gains, MD, 30 mL at 10/18/21 0826   0.9 %  sodium chloride infusion, 250 mL, Intravenous, Continuous, [name], MD, Stopped at 10/14/21 0548   0.9 %  sodium chloride infusion, 250 mL, Intravenous, Continuous,  [name], MD, Stopped at 10/18/21 1131   albuterol (PROVENTIL) (2.5 MG/3ML) 0.083% nebulizer solution 2.5 mg, 2.5 mg, Nebulization, Q2H PRN, [name], MD   ALPRAZolam Duanne Moron) tablet 1.5 mg, 1.5 mg, Oral, TID PRN, Annabeth Tortora A   ascorbic acid (VITAMIN C) tablet 500 mg, 500 mg, Oral, BID, Hunsucker, Bonna Gains, MD, 500 mg at 10/18/21 0827   bictegravir-emtricitabine-tenofovir AF (BIKTARVY) 50-200-25 MG per tablet 1 tablet, 1 tablet, Oral, Daily, Harris, Whitney D, NP, 1 tablet at 10/18/21 0827   chlorhexidine (PERIDEX) 0.12 % solution 15 mL, 15 mL, Mouth Rinse, BID, Ramaswamy, Murali, MD, 15 mL at 10/18/21 0825   Chlorhexidine Gluconate Cloth 2 % PADS 6 each, 6 each, Topical, Q0600, Brand Males, MD, 6 each at 10/17/21 1724   dextrose 10 % infusion, , Intravenous, Continuous, Blount, Xenia T, NP, Last Rate: 50 mL/hr at 10/18/21 1305, Infusion Verify at 10/18/21 1305   dextrose 5 % and 0.45 % NaCl with KCl 40 mEq/L infusion, , Intravenous, Continuous, [name], NP, Last Rate: 75 mL/hr at 10/18/21 1446, New Bag at 10/18/21 1446   docusate sodium (COLACE) capsule 100 mg, 100 mg, Oral, BID PRN, Hunsucker, Bonna Gains, MD   escitalopram (LEXAPRO) tablet 20 mg, 20 mg, Oral, Daily, Hunsucker, Bonna Gains, MD, 20 mg at 10/18/21 0827   famotidine (PEPCID) IVPB 20 mg premix, 20 mg, Intravenous, Q12H, [name], MD, Stopped at 95/32/02 3343   folic acid (FOLVITE) tablet 1 mg, 1 mg, Oral, Daily, Hunsucker, Bonna Gains, MD, 1 mg at 10/18/21 0827   food thickener (SIMPLYTHICK (NECTAR/LEVEL 2/MILDLY THICK)) 1 packet, 1 packet, Oral, PRN, [name], MD   ipratropium-albuterol (DUONEB) 0.5-2.5 (3) MG/3ML nebulizer solution 3 mL, 3 mL, Nebulization, BID, Ramaswamy, Murali, MD, 3 mL at 10/18/21 0742   lactulose (CHRONULAC) 10 GM/15ML solution 10 g, 10 g, Oral, TID, Hunsucker, Bonna Gains, MD, 10 g at 10/16/21 2122   lip balm (CARMEX) ointment, , Topical, PRN,  [name], NP   MEDLINE mouth rinse, 15 mL, Mouth Rinse, q12n4p, Ramaswamy, Murali, MD, 15 mL at 10/16/21 1238   midodrine (PROAMATINE) tablet 5 mg, 5 mg, Oral, TID WC, Hunsucker, Bonna Gains, MD, 5 mg at 10/18/21 1257   multivitamin with minerals tablet 1 tablet, 1 tablet, Oral, Daily, Hunsucker, Bonna Gains, MD, 1 tablet at 10/18/21 0827   mupirocin ointment (BACTROBAN) 2 %, , Nasal, BID, Brand Males, MD, Given at 10/18/21 5686   nicotine (NICODERM CQ - dosed in mg/24 hours) patch 14 mg, 14 mg, Transdermal, Daily, [name], MD, 14 mg at 10/18/21 0837   OLANZapine zydis (ZYPREXA) disintegrating tablet 5 mg, 5 mg, Oral, QHS, Anwar, Zeba, MD, 5 mg at 10/17/21 2219   pantoprazole (PROTONIX) EC tablet 40 mg, 40 mg, Oral, BID, Salvadore Dom E, NP, 40 mg at 10/18/21 0826   polyethylene glycol (MIRALAX / GLYCOLAX) packet 17 g, 17 g, Oral, Daily PRN, Hunsucker, Bonna Gains, MD   potassium chloride (KLOR-CON) packet 40 mEq, 40 mEq, Oral, Daily, Hunsucker, Bonna Gains, MD, 40 mEq at 10/18/21 [phone]  sodium chloride flush (NS) 0.9 % injection 10-40 mL, 10-40 mL, Intracatheter, Q12H, Drew Clamp, MD, 10 mL at 10/18/21 0830   sodium chloride flush (NS) 0.9 % injection 10-40 mL, 10-40 mL, Intracatheter, PRN, Drew Clamp, MD   tetracycline (SUMYCIN) capsule 500 mg, 500 mg, Oral, BID, Hunsucker, Bonna Gains, MD, 500 mg at 10/18/21 3552   thiamine (B-1) 400 mg in sodium chloride 0.9 % 50 mL IVPB, 400 mg, Intravenous, TID, Peyton Spengler A   thiamine tablet 250 mg, 250 mg, Oral, Daily, Hunsucker, Bonna Gains, MD, 250 mg at 10/17/21 1054   valACYclovir (VALTREX) tablet 1,000 mg, 1,000 mg, Oral, Daily, Hunsucker, Bonna Gains, MD, 1,000 mg at 10/18/21 1747   zinc sulfate capsule 220 mg, 220 mg, Oral, Daily, Hunsucker, Bonna Gains, MD, 220 mg at 10/18/21 0827  Allergies: Allergies  Allergen Reactions   Dapsone Other (See Comments)    Per centricity "G6PD deficient"   Primaquine  Phosphate Other (See Comments)    Per Centricity "G6PD deficient"       Objective  Vital signs:  Temp:  [97.4 F (36.3 C)-97.7 F (36.5 C)] 97.4 F (36.3 C) (01/09 1151) Pulse Rate:  [45-100] 97 (01/09 1132) Resp:  [12-19] 14 (01/09 1132) BP: (99-136)/(75-107) 116/83 (01/09 1132) SpO2:  [96 %-100 %] 98 % (01/09 1132) Weight:  [159 kg] 113 kg (01/09 0500)   Psychiatric Specialty Exam:  Presentation  General Appearance: Appropriate for Environment; Casual  Eye Contact:Good  Speech:Clear and Coherent; Normal Rate  Speech Volume:Normal  Handedness:Right   Mood and Affect  Mood:Euthymic  Affect:Appropriate; Congruent   Thought Process  Thought Processes:Coherent; Goal Directed; Linear  Descriptions of Associations:Intact  Orientation:Full (Time, Place and Person)  Thought Content:Logical  History of Schizophrenia/Schizoaffective disorder:No data recorded Duration of Psychotic Symptoms:No data recorded Hallucinations:No data recorded Ideas of Reference:None  Suicidal Thoughts:No data recorded Homicidal Thoughts:No data recorded  Sensorium  Memory:Immediate Fair; Recent Fair; Remote Fair  Judgment:Fair  Insight:Fair   Executive Functions  Concentration:Fair  Attention Span:Fair  [place]   Psychomotor Activity  Psychomotor Activity:No data recorded  Assets  Assets:Desire for Improvement; Financial Resources/Insurance; Leisure Time; Intimacy; Engineer, structural; Physical Health   Sleep  Sleep:No data recorded   Physical Exam: Physical Exam Constitutional:      Appearance: He is ill-appearing.     Comments: Multiple small raised lesions to skin visible  HENT:     Head: Normocephalic.  Pulmonary:     Comments: Frequently coughs Neurological:     Mental Status: He is alert and oriented to person, place, and time.   ROS Blood pressure 116/83, pulse 97, temperature (!)  97.4 F (36.3 C), temperature source Oral, resp. rate 14, height 6' 3"  (1.905 m), weight 113 kg, SpO2 98 %. Body mass index is 31.14 kg/m.

## 2021-10-18 NOTE — TOC PASRR Note (Signed)
30 Day Passar Note  RE: [name]  Date of Birth: [birth year]__   Date:10/18/2021        To Whom It May Concern:  Please be advised that the above-named patient will require a short-term nursing home stay - anticipated 30 days or less for rehabilitation and strengthening.  The plan is for return home.   Evette Cristal, MSW, Marlinda Mike [phone]

## 2021-10-18 NOTE — BH Assessment (Deleted)
30 Day Passar Note  RE: [name]  Date of Birth: [birth year]__   Date:10/18/2021        To Whom It May Concern:  Please be advised that the above-named patient will require a short-term nursing home stay - anticipated 30 days or less for rehabilitation and strengthening.  The plan is for return home.   Evette Cristal, MSW, Marlinda Mike [phone]

## 2021-10-18 NOTE — TOC Progression Note (Addendum)
Transition of Care [facility]) - Progression Note    Patient Details  Name: [name] MRN: [id number] Date of Birth: February 07, 1986  [facility] CM/SW Contact  Ross Ludwig, [place] Phone Number: 10/18/2021, 1:28 PM  Clinical Narrative:     CSW reviewing patient's information and will ask [facility] and [facility] to screen to see if patient is a candidate.  3:00pm  CSW received a response back from [facility] and [facility], patient is not a candidate for LTAC.  CSW to pursue SNF placement, patient will be a level 2 Passar screen.  CSW to continue to follow patient's progress throughout discharge planning.        Expected Discharge Plan and Services  SNF verse [facility].                                               Social Determinants of Health (SDOH) Interventions    Readmission Risk Interventions Readmission Risk Prevention Plan 08/24/2021 08/24/2021 11/01/2019  Transportation Screening Complete - -  PCP or Specialist Appt within 3-5 Days Complete Complete -  Home Care Screening - - Complete  Medication Review (RN CM) - - Complete  HRI or Home Care Consult Complete Complete -  Social Work Consult for [place] Planning/Counseling - Complete -  Palliative Care Screening Complete Complete -  Medication Review Press photographer) Complete Complete -  Some recent data might be hidden

## 2021-10-18 NOTE — Progress Notes (Signed)
PROGRESS NOTE    [name]  XLK:[id number] DOB: 1986/04/09 DOA: 10/06/2021 PCP: [name], MD    Brief Narrative:  This 36 years old African-American male with PMH significant for tobacco abuse, HIV+ , bipolar disorder, hypertension, migraine, peripheral neuropathy, PTSD, schizoaffective disorder, seizure disorder presented in the ED with complaints of sores on his buttocks.  Patient had a visit from home health services who raised the concern that his sores have become infected.  Patient was noted to be hypothermic on arrival.  Labs in the ER showed lactic acid 6.4, potassium 2.7, abnormal LFTs.  Patient was hypotensive requiring pressor support.  Patient was agitated and restless requiring Precedex.  Subsequently patient was intubated and admitted in the ICU.  He is now off pressors, off Precedex drip. Septic shock resolved.  Patient continued to remain confused and has no capacity to make decisions. Palliative care consulted to discuss goals of care.  Mother is understanding that he has poor prognosis.  She is receptive to consider the options of hospice. PCCM pickup 10/15/2021.  Patient is currently on room air, he is on comfort feeds,  he has a Foley catheter, remains afebrile, He is very deconditioned. Plan: Continue goals of care discussion with palliative care.   Assessment & Plan:   Active Problems:   Schizoaffective disorder, depressive type (HCC)   Hypokalemia   Elevated LFTs   Acute encephalopathy   Macrocytic anemia   Thrombocytopenia (HCC)   Prolonged QT interval   HIV positive (HCC)   Avascular necrosis of femoral head, left (HCC)   Hepatic steatosis   Cellulitis of multiple sites of buttock   Protein-calorie malnutrition, severe (HCC)   DNR (do not resuscitate)   Aspiration pneumonia ([place])   FTT (failure to thrive) in adult   Dysphagia   Coagulopathy ([place])   Palliative care by specialist  Septic shock sec. to E. coli UTI /  LE ulcerations: Patient  completed course of antibiotics on 10/15/2021. Sepsis physiology resolved, Continue midodrine. Continue Foley catheter given difficult insertion. Consider voiding trial when patient becomes more ambulatory.  Acute on chronic metabolic encephalopathy: Complicated by bipolar disorder, schizoaffective, EtOH abuse, opioid dependence and PTSD, He is off Precedex drip now. Avoid antipsychotics such as Haldol or Seroquel given QTC issues. Continue Lexapro and Latuda Continue Xanax, Continue lactulose, Back to baseline mental status with intermittent agitation and restlessness requiring antipsychotics. Palliative care started Zyprexa ODT as needed. Psych consulted for reevaluation,  awaiting recommendation.  Chronic hypotension : Completed stress dose steroids. Continue midodrine 5 mg 3 times daily.  Epistaxis: > Improved. Suspect bleeding from repeated NG tube insertions in context of thrombocytopenia and coagulopathy. Continue aspiration precautions.  Acute on chronic dysphagia: Patient failed dysphagia 3 diet, He continued to pull NG tube even when heavily sedated. Speech consulted recommended MBS. Continue sips with direct supervision. Speech recommended thick liquids.  EtOH cirrhosis: Chronic elevated ammonia: Continue lactulose.  HIV: Continue Biktarvy.  Mild coagulopathy: Suspect due to chronic liver disease.  Chronic anemia / thrombocytopenia: Could be due to EtOH abuse. S/p 2 units of platelets and 3 unit PRBC. Hemoglobin stable, Continue to hold anticoagulation given hemoglobin drop.  Continue SCDs.  Severe deconditioning with severe fragility Failure to thrive and protein calorie malnutrition: Patient is seen by palliative care. Maximize nutrition, needs long-term placement. Continue comfort feeds now.  Open wound right upper arm:  Multiple scarring from prior ulceration Continue Xeroform gauze dressing,   Continue tetracycline   Hypomagnesemia: Replaced.   Continue  to monitor   DVT prophylaxis: SCDs Code Status: DNR Family Communication: No family at bed side. Disposition Plan:   Status is: Inpatient  Remains inpatient appropriate because:  Admitted for septic shock which is now resolved. Patient has completed course of antibiotics.   Patient will need comfort feeds and possible placement in a nursing home with palliative following  Anticipated discharge to skilled nursing facility with palliative following.  Consultants:  PCCM, urology  Procedures:  Antimicrobials:  . Anti-infectives (From admission, onward)    Start     Dose/Rate Route Frequency Ordered Stop   10/14/21 1000  valACYclovir (VALTREX) tablet 1,000 mg        1,000 mg Oral Daily 10/13/21 1152     10/13/21 2000  tetracycline (SUMYCIN) capsule 500 mg        500 mg Oral 2 times daily 10/13/21 1152     10/12/21 0930  tetracycline (SUMYCIN) capsule 500 mg  Status:  Discontinued        500 mg Per Tube 2 times daily 10/12/21 0918 10/13/21 1152   10/10/21 1000  valACYclovir (VALTREX) tablet 1,000 mg  Status:  Discontinued        1,000 mg Per Tube Daily 10/09/21 1344 10/13/21 1152   10/09/21 2000  cefTRIAXone (ROCEPHIN) 2 g in sodium chloride 0.9 % 100 mL IVPB        2 g 200 mL/hr over 30 Minutes Intravenous Every 24 hours 10/09/21 1705 10/15/21 2143   10/07/21 1800  ceFEPIme (MAXIPIME) 2 g in sodium chloride 0.9 % 100 mL IVPB  Status:  Discontinued        2 g 200 mL/hr over 30 Minutes Intravenous Every 8 hours 10/07/21 0954 10/09/21 1705   10/07/21 1230  bictegravir-emtricitabine-tenofovir AF (BIKTARVY) 50-200-25 MG per tablet 1 tablet        1 tablet Oral Daily 10/07/21 1138     10/07/21 1200  metroNIDAZOLE (FLAGYL) IVPB 500 mg  Status:  Discontinued        500 mg 100 mL/hr over 60 Minutes Intravenous Every 12 hours 10/07/21 0951 10/08/21 1601   10/07/21 1200  vancomycin (VANCOREADY) IVPB 1750 mg/350 mL  Status:  Discontinued        1,750 mg 175 mL/hr over 120 Minutes  Intravenous Every 12 hours 10/07/21 0958 10/09/21 1705   10/07/21 1100  ceFEPIme (MAXIPIME) 2 g in sodium chloride 0.9 % 100 mL IVPB        2 g 200 mL/hr over 30 Minutes Intravenous  Once 10/07/21 0952 10/07/21 1236   10/07/21 1000  valACYclovir (VALTREX) tablet 1,000 mg  Status:  Discontinued        1,000 mg Oral Daily 10/07/21 0232 10/09/21 1344   10/06/21 2000  piperacillin-tazobactam (ZOSYN) IVPB 3.375 g        3.375 g 100 mL/hr over 30 Minutes Intravenous  Once 10/06/21 1954 10/06/21 2238   10/06/21 2000  vancomycin (VANCOREADY) IVPB 2000 mg/400 mL        2,000 mg 200 mL/hr over 120 Minutes Intravenous  Once 10/06/21 1955 10/06/21 2350       Subjective: Patient was seen and examined at bedside.  Overnight events noted. Patient was agitated and restless trying to get out of bed, was given Haldol x1. He seems reasonably improved now, wants to be discharged.  Objective: Vitals:   10/18/21 0700 10/18/21 0742 10/18/21 0800 10/18/21 0815  BP:   115/89   Pulse: 97  89   Resp: 12  17  Temp:    97.7 F (36.5 C)  TempSrc:    Oral  SpO2: 96% 99% 98%   Weight:      Height:        Intake/Output Summary (Last 24 hours) at 10/18/2021 1127 Last data filed at 10/18/2021 9629 Gross per 24 hour  Intake 2607.2 ml  Output 2050 ml  Net 557.2 ml   Filed Weights   10/16/21 0623 10/17/21 0500 10/18/21 0500  Weight: 110.7 kg 110.7 kg 113 kg    Examination:  General exam: Appears chronically ill looking, deconditioned, not in any acute distress, comfortable. Respiratory system: Decreased breath sounds bilaterally, no accessory muscle use, respiratory effort normal. Cardiovascular system: S1-S2 heard, regular rate and rhythm, no murmur. Gastrointestinal system: Abdomen is soft, nontender, nondistended, BS+ Central nervous system: Alert and oriented X 2. No focal neurological deficits. Extremities: Edema+ , no cyanosis, no clubbing. Skin: Multiple open wounds on the right arm, lower  extremities Psychiatry: Mood & affect appropriate.     Data Reviewed: I have personally reviewed following labs and imaging studies  CBC: Recent Labs  Lab 10/14/21 0420 10/14/21 1849 10/15/21 0354 10/16/21 0443 10/17/21 0500 10/18/21 0500  WBC 11.7*  --  14.3* 11.5* 10.9* 11.9*  NEUTROABS 7.4  --   --   --   --   --   HGB 5.9* 8.3* 7.8* 8.2* 8.1* 9.4*  HCT 19.2* 27.1* 26.1* 27.7* 27.7* 30.9*  MCV 105.5*  --  99.2 102.2* 104.5* 101.3*  PLT 67*  --  95* 105* 118* 528   Basic Metabolic Panel: Recent Labs  Lab 10/13/21 0416 10/13/21 1700 10/14/21 0420 10/15/21 0354 10/16/21 0443 10/17/21 0813 10/18/21 0500  NA 144  --  147* 148* 146* 141  --   K 3.9  --  3.3* 3.8 4.0 4.0  --   CL 112*  --  115* 116* 116* 115*  --   CO2 26  --  28 27 27 26   --   GLUCOSE 164*  --  160* 138* 94 104*  --   BUN 31*  --  29* 21* 14 12  --   CREATININE 1.02  --  1.03 0.91 0.77 0.66  --   CALCIUM 8.5*  --  8.5* 8.6* 8.4* 7.9*  --   MG 2.6* 2.1 2.2  --  1.6* 1.6* 1.6*  PHOS 2.2* 2.4* 2.5  --  3.3 3.1  --    GFR: Estimated Creatinine Clearance: 171.5 mL/min (by C-G formula based on SCr of 0.66 mg/dL). Liver Function Tests: Recent Labs  Lab 10/12/21 0500 10/13/21 0416 10/14/21 0420  AST 31 43* 36  ALT 32 42 40  ALKPHOS 271* 332* 300*  BILITOT 1.0 0.9 0.8  PROT 5.9* 6.5 5.8*  ALBUMIN <1.5* <1.5* <1.5*   No results for input(s): LIPASE, AMYLASE in the last 168 hours. No results for input(s): AMMONIA in the last 168 hours.  Coagulation Profile: Recent Labs  Lab 10/12/21 0500 10/14/21 1141  INR 1.5* 1.2   Cardiac Enzymes: No results for input(s): CKTOTAL, CKMB, CKMBINDEX, TROPONINI in the last 168 hours. BNP (last 3 results) No results for input(s): PROBNP in the last 8760 hours. HbA1C: No results for input(s): HGBA1C in the last 72 hours. CBG: Recent Labs  Lab 10/17/21 0743 10/17/21 1149 10/17/21 1609 10/18/21 0058 10/18/21 0758  GLUCAP 95 87 82 92 71   Lipid  Profile: No results for input(s): CHOL, HDL, LDLCALC, TRIG, CHOLHDL, LDLDIRECT in the last 72 hours. Thyroid  Function Tests: No results for input(s): TSH, T4TOTAL, FREET4, T3FREE, THYROIDAB in the last 72 hours. Anemia Panel: No results for input(s): VITAMINB12, FOLATE, FERRITIN, TIBC, IRON, RETICCTPCT in the last 72 hours. Sepsis Labs: No results for input(s): PROCALCITON, LATICACIDVEN in the last 168 hours.   No results found for this or any previous visit (from the past 240 hour(s)).   Radiology Studies: No results found.  Scheduled Meds:  (feeding supplement) PROSource Plus  30 mL Oral QID   ALPRAZolam  2 mg Oral TID   vitamin C  500 mg Oral BID   bictegravir-emtricitabine-tenofovir AF  1 tablet Oral Daily   chlorhexidine  15 mL Mouth Rinse BID   Chlorhexidine Gluconate Cloth  6 each Topical Q0600   escitalopram  20 mg Oral Daily   folic acid  1 mg Oral Daily   ipratropium-albuterol  3 mL Nebulization BID   lactulose  10 g Oral TID   lurasidone  120 mg Oral Daily   mouth rinse  15 mL Mouth Rinse q12n4p   midodrine  5 mg Oral TID WC   multivitamin with minerals  1 tablet Oral Daily   mupirocin ointment   Nasal BID   nicotine  14 mg Transdermal Daily   OLANZapine zydis  5 mg Oral QHS   pantoprazole  40 mg Oral BID   potassium chloride  40 mEq Oral Daily   sodium chloride flush  10-40 mL Intracatheter Q12H   tetracycline  500 mg Oral BID   thiamine injection  100 mg Intravenous Daily   thiamine  250 mg Oral Daily   valACYclovir  1,000 mg Oral Daily   zinc sulfate  220 mg Oral Daily   Continuous Infusions:  sodium chloride Stopped (10/14/21 0548)   sodium chloride 250 mL (10/18/21 1008)   dextrose 50 mL/hr at 10/18/21 0500   dextrose 5 % and 0.45 % NaCl with KCl 40 mEq/L 75 mL/hr at 10/18/21 0623   famotidine (PEPCID) IV Stopped (10/17/21 2309)     LOS: 11 days    Time spent: 35 mins    Willford Rabideau, MD [facility]   If 7PM-7AM, please contact  night-coverage

## 2021-10-19 DIAGNOSIS — A419 Sepsis, unspecified organism: Secondary | ICD-10-CM | POA: Diagnosis not present

## 2021-10-19 DIAGNOSIS — R6521 Severe sepsis with septic shock: Secondary | ICD-10-CM | POA: Diagnosis not present

## 2021-10-19 LAB — GLUCOSE, CAPILLARY
Glucose-Capillary: 73 mg/dL (ref 70–99)
Glucose-Capillary: 80 mg/dL (ref 70–99)
Glucose-Capillary: 88 mg/dL (ref 70–99)
Glucose-Capillary: 89 mg/dL (ref 70–99)
Glucose-Capillary: 93 mg/dL (ref 70–99)
Glucose-Capillary: 94 mg/dL (ref 70–99)
Glucose-Capillary: 94 mg/dL (ref 70–99)

## 2021-10-19 MED ORDER — GERHARDT'S BUTT CREAM
TOPICAL_CREAM | Freq: Two times a day (BID) | CUTANEOUS | Status: DC
  Administered 2021-10-20 – 2021-10-31 (×7): 1 via TOPICAL
  Filled 2021-10-19 (×3): qty 1

## 2021-10-19 MED ORDER — GERHARDT'S BUTT CREAM
TOPICAL_CREAM | Freq: Two times a day (BID) | CUTANEOUS | Status: DC
  Filled 2021-10-19: qty 1

## 2021-10-19 MED ORDER — CHLORHEXIDINE GLUCONATE CLOTH 2 % EX PADS
6.0000 | MEDICATED_PAD | Freq: Every day | CUTANEOUS | Status: DC
  Administered 2021-10-20 – 2021-11-04 (×14): 6 via TOPICAL

## 2021-10-19 NOTE — Plan of Care (Signed)
Discussed with patient plan of care for the evening, pain management and potential bath with wound care in the AM with some teach back displayed  Problem: Education: Goal: Knowledge of General Education information will improve Description: Including pain rating scale, medication(s)/side effects and non-pharmacologic comfort measures Outcome: Progressing   Problem: Health Behavior/Discharge Planning: Goal: Ability to manage health-related needs will improve Outcome: Progressing

## 2021-10-19 NOTE — Consult Note (Signed)
WOC Nurse Consult Note: Patient receiving care in [place] Reason for Consult: Fungus like rash in the groin/buttock area Wound type: Moisture/rash consistent with chronic scattered areas over the body that have been addressed previously. No complaints of pain or itching however this rash and scattered lesions are consistent with fungal type appearance and could benefit from Gerhardt's butt cream and antifungal powder Pressure Injury POA: NA Dressing procedure/placement/frequency: Clean the perineal area with soap and water, rinse and dry then apply a coat of Gerhardt's butt cream over the area and apply fungal powder over the Gerhardt's. Apply twice daily or PRN soiling.   Monitor the wound area(s) for worsening of condition such as: Signs/symptoms of infection, increase in size, development of or worsening of odor, development of pain, or increased pain at the affected locations.   Notify the medical team if any of these develop.  Thank you for the consult. [place] nurse will not follow at this time.   Please re-consult the [place] team if needed.  Cathlean Marseilles Tamala Julian, MSN, RN, [place], [place]le [place], [facility] Wound Treatment Associate Pager [phone]

## 2021-10-19 NOTE — Progress Notes (Signed)
PROGRESS NOTE    [name]  CHY:[id number] DOB: 1985-10-27 DOA: 10/06/2021 PCP: [name], MD    Brief Narrative:  This 36 years old African-American male with PMH significant for tobacco abuse, HIV+ , bipolar disorder, hypertension, migraine, peripheral neuropathy, PTSD, schizoaffective disorder, seizure disorder presented in the ED with complaints of sores on his buttocks.  Patient had a visit from home health services who raised the concern that his sores have become infected.  Patient was noted to be hypothermic on arrival.  Labs in the ER showed lactic acid 6.4, potassium 2.7, abnormal LFTs.  Patient was hypotensive requiring pressor support.  Patient was agitated and restless requiring Precedex.  Subsequently patient was intubated and admitted in the ICU.  He is now off pressors, off Precedex drip. Septic shock resolved.  Patient continued to remain confused and has no capacity to make decisions. Palliative care consulted to discuss goals of care.  Mother is understanding that he has poor prognosis.  She is receptive to consider the options of hospice. PCCM pickup 10/15/2021.  Patient is currently on room air, he is on comfort feeds,  he has a Foley catheter, remains afebrile, He is very deconditioned. Plan: Continue goals of care discussion with palliative care.   Assessment & Plan:   Active Problems:   Schizoaffective disorder, depressive type (HCC)   Hypokalemia   Elevated LFTs   Acute encephalopathy   Macrocytic anemia   Thrombocytopenia (HCC)   Prolonged QT interval   HIV positive (HCC)   Avascular necrosis of femoral head, left (HCC)   Hepatic steatosis   Cellulitis of multiple sites of buttock   Protein-calorie malnutrition, severe (HCC)   DNR (do not resuscitate)   Aspiration pneumonia ([place])   FTT (failure to thrive) in adult   Dysphagia   Coagulopathy ([place])   Palliative care by specialist  Septic shock sec. to E. coli UTI /  LE ulcerations: Patient  completed course of antibiotics on 10/15/2021. Sepsis physiology resolved, Continue midodrine. Continue Foley catheter given difficult insertion. Consider voiding trial when patient becomes more ambulatory.  Acute on chronic metabolic encephalopathy: Complicated by bipolar disorder, schizoaffective, EtOH abuse, opioid dependence and PTSD, He is off Precedex drip now. Avoid antipsychotics such as Haldol or Seroquel given QTC issues. Continue Lexapro, Xanax, Continue lactulose, Back to baseline mental status with intermittent agitation and restlessness requiring antipsychotics. Palliative care started Zyprexa ODT as needed. Psychiatry consulted, recommended to discontinue Latuda, reduce Xanax and continue Zyprexa ODT and Lexapro.  Chronic hypotension : Completed stress dose steroids. Continue midodrine 5 mg 3 times daily.  Epistaxis: > Improved. Suspect bleeding from repeated NG tube insertions in context of thrombocytopenia and coagulopathy. Continue aspiration precautions.  Acute on chronic dysphagia: Patient failed dysphagia 3 diet, He continued to pull NG tube even when heavily sedated. Speech consulted recommended MBS. Continue sips with direct supervision.  Speech recommended thick liquids.  EtOH cirrhosis: Chronic elevated ammonia: Continue lactulose.  HIV: Continue Biktarvy.  Mild coagulopathy: Suspect due to chronic liver disease.  Chronic anemia / thrombocytopenia: Could be due to EtOH abuse. S/p 2 units of platelets and 3 unit PRBC. Hemoglobin stable, Continue to hold anticoagulation given hemoglobin drop.  Continue SCDs.  Severe deconditioning. Failure to thrive and protein calorie malnutrition: Patient was seen by palliative care. Maximize nutrition, needs long-term placement. Continue comfort feeds now.  Open wound right upper arm:  Multiple scarring from prior ulceration. Continue Xeroform gauze dressing,   Continue tetracycline    Hypomagnesemia: Replaced.  Continue to monitor.   DVT prophylaxis: SCDs Code Status: DNR Family Communication: No family at bed side. Disposition Plan:   Status is: Inpatient  Remains inpatient appropriate because:  Admitted for septic shock which is now resolved. Patient has completed course of antibiotics.   Patient will need comfort feeds and possible placement in a nursing home with palliative following  Anticipated discharge to skilled nursing facility with palliative following.  Consultants:  PCCM, urology  Procedures:  Antimicrobials:  . Anti-infectives (From admission, onward)    Start     Dose/Rate Route Frequency Ordered Stop   10/14/21 1000  valACYclovir (VALTREX) tablet 1,000 mg        1,000 mg Oral Daily 10/13/21 1152     10/13/21 2000  tetracycline (SUMYCIN) capsule 500 mg        500 mg Oral 2 times daily 10/13/21 1152     10/12/21 0930  tetracycline (SUMYCIN) capsule 500 mg  Status:  Discontinued        500 mg Per Tube 2 times daily 10/12/21 0918 10/13/21 1152   10/10/21 1000  valACYclovir (VALTREX) tablet 1,000 mg  Status:  Discontinued        1,000 mg Per Tube Daily 10/09/21 1344 10/13/21 1152   10/09/21 2000  cefTRIAXone (ROCEPHIN) 2 g in sodium chloride 0.9 % 100 mL IVPB        2 g 200 mL/hr over 30 Minutes Intravenous Every 24 hours 10/09/21 1705 10/15/21 2143   10/07/21 1800  ceFEPIme (MAXIPIME) 2 g in sodium chloride 0.9 % 100 mL IVPB  Status:  Discontinued        2 g 200 mL/hr over 30 Minutes Intravenous Every 8 hours 10/07/21 0954 10/09/21 1705   10/07/21 1230  bictegravir-emtricitabine-tenofovir AF (BIKTARVY) 50-200-25 MG per tablet 1 tablet        1 tablet Oral Daily 10/07/21 1138     10/07/21 1200  metroNIDAZOLE (FLAGYL) IVPB 500 mg  Status:  Discontinued        500 mg 100 mL/hr over 60 Minutes Intravenous Every 12 hours 10/07/21 0951 10/08/21 1601   10/07/21 1200  vancomycin (VANCOREADY) IVPB 1750 mg/350 mL  Status:  Discontinued         1,750 mg 175 mL/hr over 120 Minutes Intravenous Every 12 hours 10/07/21 0958 10/09/21 1705   10/07/21 1100  ceFEPIme (MAXIPIME) 2 g in sodium chloride 0.9 % 100 mL IVPB        2 g 200 mL/hr over 30 Minutes Intravenous  Once 10/07/21 0952 10/07/21 1236   10/07/21 1000  valACYclovir (VALTREX) tablet 1,000 mg  Status:  Discontinued        1,000 mg Oral Daily 10/07/21 0232 10/09/21 1344   10/06/21 2000  piperacillin-tazobactam (ZOSYN) IVPB 3.375 g        3.375 g 100 mL/hr over 30 Minutes Intravenous  Once 10/06/21 1954 10/06/21 2238   10/06/21 2000  vancomycin (VANCOREADY) IVPB 2000 mg/400 mL        2,000 mg 200 mL/hr over 120 Minutes Intravenous  Once 10/06/21 1955 10/06/21 2350       Subjective: Patient was seen and examined at bedside.  Overnight events noted. Patient seems reasonably improved now. He is trying to get out of bed. Patient denies any symptoms and wants to be discharged.  Objective: Vitals:   10/19/21 1000 10/19/21 1100 10/19/21 1200 10/19/21 1215  BP:  104/83    Pulse: 98 96 98 (!) 102  Resp: 16 15 17  18  Temp:    98 F (36.7 C)  TempSrc:    Oral  SpO2: 97% 99% 98% 99%  Weight:      Height:        Intake/Output Summary (Last 24 hours) at 10/19/2021 1240 Last data filed at 10/19/2021 1203 Gross per 24 hour  Intake 3210.6 ml  Output 3200 ml  Net 10.6 ml   Filed Weights   10/17/21 0500 10/18/21 0500 10/19/21 0500  Weight: 110.7 kg 113 kg 113.5 kg    Examination:  General exam: Appears chronically ill looking, deconditioned, not in any acute distress. Respiratory system: Decreased breath sounds, no accessory muscle use, respiratory effort normal. Cardiovascular system: S1-S2 heard, regular rate and rhythm, no murmur. Gastrointestinal system: Abdomen is soft, nontender, nondistended, BS+ Central nervous system: Alert and oriented X 2. No focal neurological deficits. Extremities: Edema+ , no cyanosis, no clubbing. Skin: Multiple open wounds on the right  arm, lower extremities Psychiatry: Mood & affect appropriate.     Data Reviewed: I have personally reviewed following labs and imaging studies  CBC: Recent Labs  Lab 10/14/21 0420 10/14/21 1849 10/15/21 0354 10/16/21 0443 10/17/21 0500 10/18/21 0500  WBC 11.7*  --  14.3* 11.5* 10.9* 11.9*  NEUTROABS 7.4  --   --   --   --   --   HGB 5.9* 8.3* 7.8* 8.2* 8.1* 9.4*  HCT 19.2* 27.1* 26.1* 27.7* 27.7* 30.9*  MCV 105.5*  --  99.2 102.2* 104.5* 101.3*  PLT 67*  --  95* 105* 118* 017   Basic Metabolic Panel: Recent Labs  Lab 10/13/21 0416 10/13/21 1700 10/14/21 0420 10/15/21 0354 10/16/21 0443 10/17/21 0813 10/18/21 0500  NA 144  --  147* 148* 146* 141  --   K 3.9  --  3.3* 3.8 4.0 4.0  --   CL 112*  --  115* 116* 116* 115*  --   CO2 26  --  28 27 27 26   --   GLUCOSE 164*  --  160* 138* 94 104*  --   BUN 31*  --  29* 21* 14 12  --   CREATININE 1.02  --  1.03 0.91 0.77 0.66  --   CALCIUM 8.5*  --  8.5* 8.6* 8.4* 7.9*  --   MG 2.6* 2.1 2.2  --  1.6* 1.6* 1.6*  PHOS 2.2* 2.4* 2.5  --  3.3 3.1  --    GFR: Estimated Creatinine Clearance: 171.8 mL/min (by C-G formula based on SCr of 0.66 mg/dL). Liver Function Tests: Recent Labs  Lab 10/13/21 0416 10/14/21 0420  AST 43* 36  ALT 42 40  ALKPHOS 332* 300*  BILITOT 0.9 0.8  PROT 6.5 5.8*  ALBUMIN <1.5* <1.5*   No results for input(s): LIPASE, AMYLASE in the last 168 hours. No results for input(s): AMMONIA in the last 168 hours.  Coagulation Profile: Recent Labs  Lab 10/14/21 1141  INR 1.2   Cardiac Enzymes: No results for input(s): CKTOTAL, CKMB, CKMBINDEX, TROPONINI in the last 168 hours. BNP (last 3 results) No results for input(s): PROBNP in the last 8760 hours. HbA1C: No results for input(s): HGBA1C in the last 72 hours. CBG: Recent Labs  Lab 10/18/21 1937 10/18/21 2357 10/19/21 0357 10/19/21 0822 10/19/21 1134  GLUCAP 91 80 88 73 94   Lipid Profile: No results for input(s): CHOL, HDL, LDLCALC,  TRIG, CHOLHDL, LDLDIRECT in the last 72 hours. Thyroid Function Tests: No results for input(s): TSH, T4TOTAL, FREET4, T3FREE, THYROIDAB  in the last 72 hours. Anemia Panel: No results for input(s): VITAMINB12, FOLATE, FERRITIN, TIBC, IRON, RETICCTPCT in the last 72 hours. Sepsis Labs: No results for input(s): PROCALCITON, LATICACIDVEN in the last 168 hours.   No results found for this or any previous visit (from the past 240 hour(s)).   Radiology Studies: No results found.  Scheduled Meds:  (feeding supplement) PROSource Plus  30 mL Oral QID   vitamin C  500 mg Oral BID   bictegravir-emtricitabine-tenofovir AF  1 tablet Oral Daily   chlorhexidine  15 mL Mouth Rinse BID   Chlorhexidine Gluconate Cloth  6 each Topical Q0600   escitalopram  20 mg Oral Daily   folic acid  1 mg Oral Daily   Gerhardt's butt cream   Topical BID   ipratropium-albuterol  3 mL Nebulization BID   lactulose  10 g Oral TID   mouth rinse  15 mL Mouth Rinse q12n4p   midodrine  5 mg Oral TID WC   multivitamin with minerals  1 tablet Oral Daily   mupirocin ointment   Nasal BID   nicotine  14 mg Transdermal Daily   OLANZapine zydis  5 mg Oral QHS   pantoprazole  40 mg Oral BID   potassium chloride  40 mEq Oral Daily   sodium chloride flush  10-40 mL Intracatheter Q12H   tetracycline  500 mg Oral BID   [START ON 10/23/2021] thiamine  250 mg Oral Daily   valACYclovir  1,000 mg Oral Daily   zinc sulfate  220 mg Oral Daily   Continuous Infusions:  sodium chloride Stopped (10/14/21 0548)   sodium chloride Stopped (10/18/21 1131)   dextrose 50 mL/hr at 10/19/21 1203   dextrose 5 % and 0.45 % NaCl with KCl 40 mEq/L 75 mL/hr at 10/19/21 1203   famotidine (PEPCID) IV Stopped (10/19/21 1103)   thiamine injection 100 mL/hr at 10/19/21 1203     LOS: 12 days    Time spent: 35 mins    Ignatius Kloos, MD [facility]   If 7PM-7AM, please contact night-coverage

## 2021-10-19 NOTE — TOC Progression Note (Signed)
Transition of Care [facility]) - Progression Note    Patient Details  Name: [name] MRN: [id number] Date of Birth: 17-Feb-1986  Transition of Care [facility]) CM/SW Contact  Ross Ludwig, [place] Phone Number: 10/19/2021, 3:31 PM  Clinical Narrative:     Patient's [facility] number has been received [id number] E expires 11/18/2021.  Patient has been faxed out to SNFs for review.  CSW also notified TOC lead to inform her patient may be a difficult to place patient.        Expected Discharge Plan and Services                                                 Social Determinants of Health (SDOH) Interventions    Readmission Risk Interventions Readmission Risk Prevention Plan 08/24/2021 08/24/2021 11/01/2019  Transportation Screening Complete - -  PCP or Specialist Appt within 3-5 Days Complete Complete -  Home Care Screening - - Complete  Medication Review (RN CM) - - Complete  HRI or Home Care Consult Complete Complete -  Social Work Consult for [place] Planning/Counseling - Complete -  Palliative Care Screening Complete Complete -  Medication Review [employer]) Complete Complete -  Some recent data might be hidden

## 2021-10-20 DIAGNOSIS — A419 Sepsis, unspecified organism: Secondary | ICD-10-CM | POA: Diagnosis not present

## 2021-10-20 DIAGNOSIS — E440 Moderate protein-calorie malnutrition: Secondary | ICD-10-CM | POA: Insufficient documentation

## 2021-10-20 DIAGNOSIS — M87052 Idiopathic aseptic necrosis of left femur: Secondary | ICD-10-CM | POA: Diagnosis not present

## 2021-10-20 DIAGNOSIS — R6521 Severe sepsis with septic shock: Secondary | ICD-10-CM | POA: Diagnosis not present

## 2021-10-20 DIAGNOSIS — G9340 Encephalopathy, unspecified: Secondary | ICD-10-CM | POA: Diagnosis not present

## 2021-10-20 LAB — GLUCOSE, CAPILLARY
Glucose-Capillary: 73 mg/dL (ref 70–99)
Glucose-Capillary: 74 mg/dL (ref 70–99)
Glucose-Capillary: 79 mg/dL (ref 70–99)
Glucose-Capillary: 83 mg/dL (ref 70–99)
Glucose-Capillary: 86 mg/dL (ref 70–99)
Glucose-Capillary: 97 mg/dL (ref 70–99)

## 2021-10-20 LAB — BASIC METABOLIC PANEL
Anion gap: 5 (ref 5–15)
BUN: 7 mg/dL (ref 6–20)
CO2: 26 mmol/L (ref 22–32)
Calcium: 8.1 mg/dL — ABNORMAL LOW (ref 8.9–10.3)
Chloride: 105 mmol/L (ref 98–111)
Creatinine, Ser: 0.76 mg/dL (ref 0.61–1.24)
GFR, Estimated: >60 mL/min (ref >60)
Glucose, Bld: 78 mg/dL (ref 70–99)
Potassium: 4.1 mmol/L (ref 3.5–5.1)
Sodium: 136 mmol/L (ref 135–145)

## 2021-10-20 LAB — CBC
HCT: 29.2 % — ABNORMAL LOW (ref 39.0–52.0)
Hemoglobin: 8.8 g/dL — ABNORMAL LOW (ref 13.0–17.0)
MCH: 30.8 pg (ref 26.0–34.0)
MCHC: 30.1 g/dL (ref 30.0–36.0)
MCV: 102.1 fL — ABNORMAL HIGH (ref 80.0–100.0)
Platelets: 143 10*3/uL — ABNORMAL LOW (ref 150–400)
RBC: 2.86 MIL/uL — ABNORMAL LOW (ref 4.22–5.81)
RDW: 21 % — ABNORMAL HIGH (ref 11.5–15.5)
WBC: 13 10*3/uL — ABNORMAL HIGH (ref 4.0–10.5)
nRBC: 0 % (ref 0.0–0.2)

## 2021-10-20 LAB — PHOSPHORUS: Phosphorus: 3.8 mg/dL (ref 2.5–4.6)

## 2021-10-20 LAB — MAGNESIUM: Magnesium: 1.5 mg/dL — ABNORMAL LOW (ref 1.7–2.4)

## 2021-10-20 MED ORDER — ENSURE ENLIVE PO LIQD
237.0000 mL | Freq: Two times a day (BID) | ORAL | Status: DC
  Administered 2021-10-20 – 2021-11-03 (×16): 237 mL via ORAL

## 2021-10-20 MED ORDER — MAGNESIUM SULFATE 4 GM/100ML IV SOLN
4.0000 g | Freq: Once | INTRAVENOUS | Status: AC
  Administered 2021-10-20: 4 g via INTRAVENOUS
  Filled 2021-10-20: qty 100

## 2021-10-20 NOTE — Progress Notes (Signed)
PROGRESS NOTE    [name]  QRF:[id number] DOB: 1985-12-17 DOA: 10/06/2021 PCP: [name], MD    Brief Narrative:  36 years old African-American male with PMH significant for tobacco abuse, HIV+ , bipolar disorder, hypertension, migraine, peripheral neuropathy, PTSD, schizoaffective disorder, seizure disorder presented in the ED with complaints of sores on his buttocks.  Patient had a visit from home health services who raised the concern that his sores have become infected.  Patient was noted to be hypothermic on arrival.  Labs in the ER showed lactic acid 6.4, potassium 2.7, abnormal LFTs.  Patient was hypotensive requiring pressor support.  Patient was agitated and restless requiring Precedex.  Subsequently patient was intubated and admitted in the ICU.  He is now off pressors, off Precedex drip. Septic shock resolved.  Patient continued to remain confused and has no capacity to make decisions. Palliative care consulted to discuss goals of care.  Mother is understanding that he has poor prognosis.  She is receptive to consider the options of hospice. PCCM pickup 10/15/2021.  Patient is currently on room air, he is on comfort feeds,  he has a Foley catheter, remains afebrile, He is very deconditioned  Assessment & Plan:   Active Problems:   Schizoaffective disorder, depressive type (HCC)   Hypokalemia   Elevated LFTs   Acute encephalopathy   Macrocytic anemia   Thrombocytopenia (HCC)   Prolonged QT interval   HIV positive (HCC)   Avascular necrosis of femoral head, left (HCC)   Hepatic steatosis   Cellulitis of multiple sites of buttock   Protein-calorie malnutrition, severe (HCC)   DNR (do not resuscitate)   Aspiration pneumonia ([place])   FTT (failure to thrive) in adult   Dysphagia   Coagulopathy ([place])   Palliative care by specialist   Malnutrition of moderate degree  Septic shock sec. to E. coli UTI /  LE ulcerations: Patient completed course of antibiotics on  10/15/2021. Sepsis physiology resolved, Continue midodrine. Per Urology consultation note, can try voiding trial Pt now more alert, would try voiding trials   Acute on chronic metabolic encephalopathy: Complicated by bipolar disorder, schizoaffective, EtOH abuse, opioid dependence and PTSD, Now off precedex Avoid antipsychotics such as Haldol or Seroquel given QTC issues. Continue Lexapro, Xanax, Continue lactulose, Back to baseline mental status with intermittent agitation and restlessness requiring antipsychotics. Palliative care started Zyprexa ODT as needed. Psychiatry consulted, recommended to discontinue Latuda, reduce Xanax and continue Zyprexa ODT and Lexapro. Pleasant and conversant this AM   Chronic hypotension : Completed stress dose steroids. Continue midodrine 5 mg 3 times daily. BP remains stable   Epistaxis: > Improved. Suspect bleeding from repeated NG tube insertions in context of thrombocytopenia and coagulopathy. Continue aspiration precautions.   Acute on chronic dysphagia: Patient failed dysphagia 3 diet, He continued to pull NG tube even when heavily sedated. Speech consulted recommended MBS. Continue sips with direct supervision.  Speech recommended thick liquids.   EtOH cirrhosis: Chronic elevated ammonia: Continue lactulose. Mentation seem stable at this time   HIV: Continue Biktarvy.   Mild coagulopathy: Suspect due to chronic liver disease.   Chronic anemia / thrombocytopenia: Could be due to EtOH abuse. S/p 2 units of platelets and 3 unit PRBC. Hemoglobin stable, Continue to hold anticoagulation given hemoglobin drop.  Continue SCDs.   Severe deconditioning. Failure to thrive and protein calorie malnutrition: Patient was seen by palliative care. Maximize nutrition, needs long-term placement. Continue comfort feeds now.   Open wound right upper arm:  Multiple scarring from prior ulceration. Continue Xeroform gauze dressing,   Continue  tetracycline as tolerated   Hypomagnesemia: Remains low at 1.5 Will replace    DVT prophylaxis: SCD's Code Status: DNR Family Communication: Pt in room, family not at bedside  Status is: Inpatient  Remains inpatient appropriate because: severity of illness   Consultants:  PCCM Urology Psychiatrty  Procedures:    Antimicrobials: Anti-infectives (From admission, onward)    Start     Dose/Rate Route Frequency Ordered Stop   10/14/21 1000  valACYclovir (VALTREX) tablet 1,000 mg        1,000 mg Oral Daily 10/13/21 1152     10/13/21 2000  tetracycline (SUMYCIN) capsule 500 mg        500 mg Oral 2 times daily 10/13/21 1152     10/12/21 0930  tetracycline (SUMYCIN) capsule 500 mg  Status:  Discontinued        500 mg Per Tube 2 times daily 10/12/21 0918 10/13/21 1152   10/10/21 1000  valACYclovir (VALTREX) tablet 1,000 mg  Status:  Discontinued        1,000 mg Per Tube Daily 10/09/21 1344 10/13/21 1152   10/09/21 2000  cefTRIAXone (ROCEPHIN) 2 g in sodium chloride 0.9 % 100 mL IVPB        2 g 200 mL/hr over 30 Minutes Intravenous Every 24 hours 10/09/21 1705 10/15/21 2143   10/07/21 1800  ceFEPIme (MAXIPIME) 2 g in sodium chloride 0.9 % 100 mL IVPB  Status:  Discontinued        2 g 200 mL/hr over 30 Minutes Intravenous Every 8 hours 10/07/21 0954 10/09/21 1705   10/07/21 1230  bictegravir-emtricitabine-tenofovir AF (BIKTARVY) 50-200-25 MG per tablet 1 tablet        1 tablet Oral Daily 10/07/21 1138     10/07/21 1200  metroNIDAZOLE (FLAGYL) IVPB 500 mg  Status:  Discontinued        500 mg 100 mL/hr over 60 Minutes Intravenous Every 12 hours 10/07/21 0951 10/08/21 1601   10/07/21 1200  vancomycin (VANCOREADY) IVPB 1750 mg/350 mL  Status:  Discontinued        1,750 mg 175 mL/hr over 120 Minutes Intravenous Every 12 hours 10/07/21 0958 10/09/21 1705   10/07/21 1100  ceFEPIme (MAXIPIME) 2 g in sodium chloride 0.9 % 100 mL IVPB        2 g 200 mL/hr over 30 Minutes Intravenous   Once 10/07/21 0952 10/07/21 1236   10/07/21 1000  valACYclovir (VALTREX) tablet 1,000 mg  Status:  Discontinued        1,000 mg Oral Daily 10/07/21 0232 10/09/21 1344   10/06/21 2000  piperacillin-tazobactam (ZOSYN) IVPB 3.375 g        3.375 g 100 mL/hr over 30 Minutes Intravenous  Once 10/06/21 1954 10/06/21 2238   10/06/21 2000  vancomycin (VANCOREADY) IVPB 2000 mg/400 mL        2,000 mg 200 mL/hr over 120 Minutes Intravenous  Once 10/06/21 1955 10/06/21 2350       Subjective: Without complaints this AM  Objective: Vitals:   10/20/21 1300 10/20/21 1400 10/20/21 1500 10/20/21 1600  BP:    101/67  Pulse: 96 94 96 95  Resp: 18 14 16 17   Temp:    97.8 F (36.6 C)  TempSrc:    Oral  SpO2: 100% 100% 98% 100%  Weight:      Height:        Intake/Output Summary (Last 24 hours) at 10/20/2021 1715  Last data filed at 10/20/2021 1700 Gross per 24 hour  Intake 2014.14 ml  Output 5750 ml  Net -3735.86 ml   Filed Weights   10/18/21 0500 10/19/21 0500 10/20/21 0500  Weight: 113 kg 113.5 kg 110.4 kg    Examination: General exam: Awake, laying in bed, in nad Respiratory system: Normal respiratory effort, no wheezing Cardiovascular system: regular rate, s1, s2 Gastrointestinal system: Soft, nondistended, positive BS Central nervous system: CN2-12 grossly intact, strength intact Extremities: Perfused, no clubbing Skin: Normal skin turgor, no notable skin lesions seen Psychiatry: Mood normal // no visual hallucinations   Data Reviewed: I have personally reviewed following labs and imaging studies  CBC: Recent Labs  Lab 10/14/21 0420 10/14/21 1849 10/15/21 0354 10/16/21 0443 10/17/21 0500 10/18/21 0500 10/20/21 0640  WBC 11.7*  --  14.3* 11.5* 10.9* 11.9* 13.0*  NEUTROABS 7.4  --   --   --   --   --   --   HGB 5.9*   < > 7.8* 8.2* 8.1* 9.4* 8.8*  HCT 19.2*   < > 26.1* 27.7* 27.7* 30.9* 29.2*  MCV 105.5*  --  99.2 102.2* 104.5* 101.3* 102.1*  PLT 67*  --  95* 105* 118*  159 143*   < > = values in this interval not displayed.   Basic Metabolic Panel: Recent Labs  Lab 10/14/21 0420 10/15/21 0354 10/16/21 0443 10/17/21 0813 10/18/21 0500 10/20/21 0640  NA 147* 148* 146* 141  --  136  K 3.3* 3.8 4.0 4.0  --  4.1  CL 115* 116* 116* 115*  --  105  CO2 28 27 27 26   --  26  GLUCOSE 160* 138* 94 104*  --  78  BUN 29* 21* 14 12  --  7  CREATININE 1.03 0.91 0.77 0.66  --  0.76  CALCIUM 8.5* 8.6* 8.4* 7.9*  --  8.1*  MG 2.2  --  1.6* 1.6* 1.6* 1.5*  PHOS 2.5  --  3.3 3.1  --  3.8   GFR: Estimated Creatinine Clearance: 169.7 mL/min (by C-G formula based on SCr of 0.76 mg/dL). Liver Function Tests: Recent Labs  Lab 10/14/21 0420  AST 36  ALT 40  ALKPHOS 300*  BILITOT 0.8  PROT 5.8*  ALBUMIN <1.5*   No results for input(s): LIPASE, AMYLASE in the last 168 hours. No results for input(s): AMMONIA in the last 168 hours. Coagulation Profile: Recent Labs  Lab 10/14/21 1141  INR 1.2   Cardiac Enzymes: No results for input(s): CKTOTAL, CKMB, CKMBINDEX, TROPONINI in the last 168 hours. BNP (last 3 results) No results for input(s): PROBNP in the last 8760 hours. HbA1C: No results for input(s): HGBA1C in the last 72 hours. CBG: Recent Labs  Lab 10/19/21 2314 10/20/21 0314 10/20/21 0758 10/20/21 1136 10/20/21 1645  GLUCAP 94 79 74 73 83   Lipid Profile: No results for input(s): CHOL, HDL, LDLCALC, TRIG, CHOLHDL, LDLDIRECT in the last 72 hours. Thyroid Function Tests: No results for input(s): TSH, T4TOTAL, FREET4, T3FREE, THYROIDAB in the last 72 hours. Anemia Panel: No results for input(s): VITAMINB12, FOLATE, FERRITIN, TIBC, IRON, RETICCTPCT in the last 72 hours. Sepsis Labs: No results for input(s): PROCALCITON, LATICACIDVEN in the last 168 hours.  No results found for this or any previous visit (from the past 240 hour(s)).   Radiology Studies: No results found.  Scheduled Meds:  (feeding supplement) PROSource Plus  30 mL Oral QID    vitamin C  500 mg Oral BID  bictegravir-emtricitabine-tenofovir AF  1 tablet Oral Daily   chlorhexidine  15 mL Mouth Rinse BID   Chlorhexidine Gluconate Cloth  6 each Topical Q0600   escitalopram  20 mg Oral Daily   feeding supplement  237 mL Oral BID BM   folic acid  1 mg Oral Daily   Gerhardt's butt cream   Topical BID   ipratropium-albuterol  3 mL Nebulization BID   lactulose  10 g Oral TID   mouth rinse  15 mL Mouth Rinse q12n4p   midodrine  5 mg Oral TID WC   multivitamin with minerals  1 tablet Oral Daily   mupirocin ointment   Nasal BID   nicotine  14 mg Transdermal Daily   OLANZapine zydis  5 mg Oral QHS   pantoprazole  40 mg Oral BID   potassium chloride  40 mEq Oral Daily   sodium chloride flush  10-40 mL Intracatheter Q12H   tetracycline  500 mg Oral BID   [START ON 10/23/2021] thiamine  250 mg Oral Daily   valACYclovir  1,000 mg Oral Daily   zinc sulfate  220 mg Oral Daily   Continuous Infusions:  sodium chloride Stopped (10/14/21 0548)   sodium chloride 10 mL/hr at 10/20/21 0628   dextrose 50 mL/hr at 10/20/21 0628   dextrose 5 % and 0.45 % NaCl with KCl 40 mEq/L 75 mL/hr at 10/20/21 0628   famotidine (PEPCID) IV Stopped (10/20/21 1008)   thiamine injection Stopped (10/20/21 1555)     LOS: 13 days   Marylu Lund, MD [facility] Pager On Amion  If 7PM-7AM, please contact night-coverage 10/20/2021, 5:15 PM

## 2021-10-20 NOTE — Progress Notes (Signed)
Nutrition Follow-up  DOCUMENTATION CODES:   Non-severe (moderate) malnutrition in context of chronic illness  INTERVENTION:  - continue 30 ml Prosource Plus QID.  - will order Ensure Enlive BID, each supplement provides 350 kcal and 20 grams of protein.  NUTRITION DIAGNOSIS:   Moderate Malnutrition related to chronic illness (HIV) as evidenced by mild fat depletion, mild muscle depletion. -ongoing  GOAL:   Patient will meet greater than or equal to 90% of their needs -progressing  MONITOR:   PO intake, Supplement acceptance, Labs, Weight trends   ASSESSMENT:   36 y.o. male with medical history of HIV, HTN, PTSD, bipolar 1 disorder, depression, seizures, migraine headaches, and peripheral neuropathy. He presented to the ED via EMS due to soreness to buttocks.  Diet advanced from NPO to Regular, nectar on 1/6 at 1152 after MBS. The only documented meal completion percentage since that time was 25% of lunch on 1/6.  Per review, he has been accepting Prosource Plus 100% of the time offered.   Patient sleeping with no visitors at bedside at the time of RD visit. Able to talk with tech and RN. Patient has been able to feed himself, doing very well with drinking liquids, and ate 100% of breakfast (607 kcal and 28 grams protein).   Lunch tray was being delivered as RD left the unit.   Weight has been stable throughout hospitalization. Non-pitting edema to BUE, moderate pitting edema to perineal area, and deep pitting edema to BLE documented in the edema section of flow sheet.   Per MD note yesterday, anticipated d/c plan is for SNF with palliative following.    Labs reviewed; CBGs: 79, 74, 73 mg/dl, Ca: 8.1 mg/dl, Mg: 1.5 mg/dl.  Medications reviewed; 500 mg ascorbic acid/day, 20 mg folvite/day, 1 tablet multivitamin with minerals/day, 40 mEq Klor-Con/day, 400 mg IV thiamine TID (1/9-1/12), 220 mg zinc sulfate/day.   IVF; D10 @ 50 ml/hr (408 kcal/24 hrs).    Diet Order:    Diet Order             Diet regular Room service appropriate? Yes; Fluid consistency: Nectar Thick  Diet effective now                   EDUCATION NEEDS:   Not appropriate for education at this time  Skin:  Skin Assessment: Reviewed RN Assessment ([place] note from 12/29)  Last BM:  1/11 (type 4 mixed with type 5)  Height:   Ht Readings from Last 1 Encounters:  10/06/21 6' 3"  (1.905 m)    Weight:   Wt Readings from Last 1 Encounters:  10/20/21 110.4 kg     Estimated Nutritional Needs:  Kcal:  2180-2400 kcal Protein:  110-125 grams Fluid:  >/= 2.3 L/day     Jarome Matin, MS, RD, LDN Inpatient Clinical Dietitian RD pager # available in [place]  After hours/weekend pager # available in [facility]

## 2021-10-21 DIAGNOSIS — A419 Sepsis, unspecified organism: Secondary | ICD-10-CM | POA: Diagnosis not present

## 2021-10-21 DIAGNOSIS — R6521 Severe sepsis with septic shock: Secondary | ICD-10-CM | POA: Diagnosis not present

## 2021-10-21 DIAGNOSIS — T17908A Unspecified foreign body in respiratory tract, part unspecified causing other injury, initial encounter: Secondary | ICD-10-CM | POA: Diagnosis not present

## 2021-10-21 LAB — CBC
HCT: 26.8 % — ABNORMAL LOW (ref 39.0–52.0)
Hemoglobin: 8.2 g/dL — ABNORMAL LOW (ref 13.0–17.0)
MCH: 30.6 pg (ref 26.0–34.0)
MCHC: 30.6 g/dL (ref 30.0–36.0)
MCV: 100 fL (ref 80.0–100.0)
Platelets: 153 10*3/uL (ref 150–400)
RBC: 2.68 MIL/uL — ABNORMAL LOW (ref 4.22–5.81)
RDW: 21.2 % — ABNORMAL HIGH (ref 11.5–15.5)
WBC: 13.1 10*3/uL — ABNORMAL HIGH (ref 4.0–10.5)
nRBC: 0 % (ref 0.0–0.2)

## 2021-10-21 LAB — GLUCOSE, CAPILLARY
Glucose-Capillary: 68 mg/dL — ABNORMAL LOW (ref 70–99)
Glucose-Capillary: 74 mg/dL (ref 70–99)
Glucose-Capillary: 76 mg/dL (ref 70–99)
Glucose-Capillary: 81 mg/dL (ref 70–99)
Glucose-Capillary: 83 mg/dL (ref 70–99)
Glucose-Capillary: 86 mg/dL (ref 70–99)
Glucose-Capillary: 88 mg/dL (ref 70–99)

## 2021-10-21 LAB — COMPREHENSIVE METABOLIC PANEL
ALT: 21 U/L (ref 0–44)
AST: 21 U/L (ref 15–41)
Albumin: <1.5 g/dL — ABNORMAL LOW (ref 3.5–5.0)
Alkaline Phosphatase: 179 U/L — ABNORMAL HIGH (ref 38–126)
Anion gap: 6 (ref 5–15)
BUN: 7 mg/dL (ref 6–20)
CO2: 27 mmol/L (ref 22–32)
Calcium: 8.2 mg/dL — ABNORMAL LOW (ref 8.9–10.3)
Chloride: 103 mmol/L (ref 98–111)
Creatinine, Ser: 0.85 mg/dL (ref 0.61–1.24)
GFR, Estimated: >60 mL/min (ref >60)
Glucose, Bld: 87 mg/dL (ref 70–99)
Potassium: 4.3 mmol/L (ref 3.5–5.1)
Sodium: 136 mmol/L (ref 135–145)
Total Bilirubin: 0.7 mg/dL (ref 0.3–1.2)
Total Protein: 5.9 g/dL — ABNORMAL LOW (ref 6.5–8.1)

## 2021-10-21 LAB — MAGNESIUM: Magnesium: 2.6 mg/dL — ABNORMAL HIGH (ref 1.7–2.4)

## 2021-10-21 MED ORDER — FAMOTIDINE 20 MG PO TABS
20.0000 mg | ORAL_TABLET | Freq: Two times a day (BID) | ORAL | Status: DC
  Administered 2021-10-21 – 2021-10-28 (×12): 20 mg via ORAL
  Filled 2021-10-21 (×14): qty 1

## 2021-10-21 NOTE — Care Management Important Message (Signed)
Important Message  Patient Details IM Letter placed in Patients room. Name: [name] MRN: [id number] Date of Birth: 07-Apr-1986   Medicare Important Message Given:  Yes     Kerin Salen 10/21/2021, 11:02 AM

## 2021-10-21 NOTE — Progress Notes (Signed)
Speech Language Pathology Treatment: Dysphagia  Patient Details Name: [name] MRN: [id number] DOB: Jan 18, 1986 Today's Date: 10/21/2021 Time: 5573-2202 SLP Time Calculation (min) (ACUTE ONLY): 42 min  Assessment / Plan / Recommendation Clinical Impression  SLP follow up to address dysphagia goals.  SLP reviewed flouro loops with pt from prior MBS last Friday to determine compensation strategies that may be helpful. His mentation is much improved today compared to Friday, January 6th.  Pt advised he is not conducting chin tuck.  Swallow delayed to pyriform sinus x approx 5 seconds allowing inconsisent aspiration with cough response.  Using teach back and demonstration, pt cued to organize bolus and swallow in timely fashion.  Pt found with honey thick juice at bedside - although he is on nectar liquids.  Obtained orange juice to thin honey thick juice to nectar.  Observed pt consuming nectar thick OJ via cup and straw x5 boluses. NO indication of aspiration noted.  Then commenced trials of thin liquids - gingerale boluses observed x5 - no s/s of aspiration with cup bolus when pt in upright position.  Prolonged immediate cough x1 after swallowing gingerale via straw, after which pt declined to consume further po intake.  Fortunately pt's cough at baseline and during po was strong and productive - indicating improved airway protection.     Recommend to consider advancing liquids to thin without use of straw to decrease aspiration risk to maximize pt's hydration with least risk. Pt informed this SLP that he chronically coughed with liquids for "years".  He continues with dysphonia - causing concern for potential subglottic air pressure impairment for swallow.  Using teach back to swallow in timely fashion, providing swallow precaution sign, etc, pt educated and agreeable to plan if MD agrees.  He required moderate verbal/visual cues for understanding, but is making progress.    Spoke to RN, [facility],  regarding recommendations.  SLP will follow up for RMST to improve laryngeal closure and cough strength for aspiration expectorates.    HPI HPI: 36 y.o. male with hx of controlled HIV on medications, bipolar disorder, schizophrenia presented to the ED via EMS with complaints of sore on his buttocks. Dx sepsis. Hx of dysphagia and aspiration during prior admissions. Voice has been documented as weak/hoarse with low volume.  MBS 10/30/19 revealed pharyngeal dysphagia with intermittent aspiration of large boluses of thin liquids - dysphagia at that time was attributed to effects of COVID on respiratory/swallowing sequence. Repeat MBS 05/08/21 showed similar deficits with delay of swallow onset after materials were sitting in the pharynx, leading to spillover into the airway. During both studies, modifying bolus size helped to prevent aspiration. His swallow was evaluated again at the bedside on 08/25/20, clinical impression was unchanged. When taking smaller sips of liquids there were no instances of coughing.  On morning of 12/30, pt was observed to likely aspirate applesauce, hence consult placed for SLP swallow eval.      SLP Plan  Continue with current plan of care (EMST)      Recommendations for follow up therapy are one component of a multi-disciplinary discharge planning process, led by the attending physician.  Recommendations may be updated based on patient status, additional functional criteria and insurance authorization.    Recommendations  Diet recommendations: Regular;Thin liquid Liquids provided via: Cup;No straw;Teaspoon Medication Administration: Crushed with puree Supervision: Full supervision/cueing for compensatory strategies Compensations: Slow rate;Small sips/bites Postural Changes and/or Swallow Maneuvers: Seated upright 90 degrees;Upright 30-60 min after meal  Oral Care Recommendations: Oral care QID Follow Up Recommendations: Skilled nursing-short term  rehab (<3 hours/day) SLP Visit Diagnosis: Dysphagia, oropharyngeal phase (R13.12) Plan: Continue with current plan of care (EMST)         Kathleen Lime, [place] [facility] [phone] Cell [phone]   [name]  10/21/2021, 11:40 AM

## 2021-10-21 NOTE — Progress Notes (Signed)
PROGRESS NOTE    [name]  DEY:[id number] DOB: 04-27-1986 DOA: 10/06/2021 PCP: [name], MD    Brief Narrative:  36 years old African-American male with PMH significant for tobacco abuse, HIV+ , bipolar disorder, hypertension, migraine, peripheral neuropathy, PTSD, schizoaffective disorder, seizure disorder presented in the ED with complaints of sores on his buttocks.  Patient had a visit from home health services who raised the concern that his sores have become infected.  Patient was noted to be hypothermic on arrival.  Labs in the ER showed lactic acid 6.4, potassium 2.7, abnormal LFTs.  Patient was hypotensive requiring pressor support.  Patient was agitated and restless requiring Precedex.  Subsequently patient was intubated and admitted in the ICU.  He is now off pressors, off Precedex drip. Septic shock resolved.  Patient continued to remain confused and has no capacity to make decisions. Palliative care consulted to discuss goals of care.  Mother is understanding that he has poor prognosis.  She is receptive to consider the options of hospice. PCCM pickup 10/15/2021.  Patient is currently on room air, he is on comfort feeds,  he has a Foley catheter, remains afebrile, He is very deconditioned  Assessment & Plan:   Active Problems:   Schizoaffective disorder, depressive type (HCC)   Hypokalemia   Elevated LFTs   Acute encephalopathy   Macrocytic anemia   Thrombocytopenia (HCC)   Prolonged QT interval   HIV positive (HCC)   Avascular necrosis of femoral head, left (HCC)   Hepatic steatosis   Cellulitis of multiple sites of buttock   Protein-calorie malnutrition, severe (HCC)   DNR (do not resuscitate)   Aspiration pneumonia ([place])   FTT (failure to thrive) in adult   Dysphagia   Coagulopathy ([place])   Palliative care by specialist   Malnutrition of moderate degree  Septic shock sec. to E. coli UTI /  LE ulcerations: Patient completed course of antibiotics on  10/15/2021. Sepsis physiology resolved, Continue midodrine. Per Urology consultation note, can try voiding trial when more alert Foley has since been removed   Acute on chronic metabolic encephalopathy: Complicated by bipolar disorder, schizoaffective, EtOH abuse, opioid dependence and PTSD, Now off precedex Avoid antipsychotics such as Haldol or Seroquel given QTC issues. Continue Lexapro, Xanax, Continue lactulose, Back to baseline mental status with intermittent agitation and restlessness requiring antipsychotics. Palliative care started Zyprexa ODT as needed. Psychiatry consulted, recommended to discontinue Latuda, reduce Xanax and continue Zyprexa ODT and Lexapro. Remains conversant. Today, pt is able to demonstrate insight to his healthcare. Pt is able to verbalize to me the reason he is in hospital, what treatment entailed, what we are currently treating, and what the risks are if he were to leave prematurely. Pt is a/o x3 and is able to make medical decisions at this time   Chronic hypotension : Completed stress dose steroids. Continue midodrine 5 mg 3 times daily. BP remains stable at this time   Epistaxis: > Improved. Suspect bleeding from repeated NG tube insertions in context of thrombocytopenia and coagulopathy. Continue aspiration precautions.   Acute on chronic dysphagia: Patient failed dysphagia 3 diet, He continued to pull NG tube even when heavily sedated. Speech consulted recommended MBS. Continue sips with direct supervision.  Speech recommended thick liquids.   EtOH cirrhosis: Chronic elevated ammonia: Continue lactulose. Mentation seem stable currently   HIV: Continue Biktarvy.   Mild coagulopathy: Suspect due to chronic liver disease.   Chronic anemia / thrombocytopenia: Could be due to EtOH abuse. S/p  2 units of platelets and 3 unit PRBC. Hemoglobin stable, Continue to hold anticoagulation given hemoglobin drop.  Continue SCDs as tolerated    Severe deconditioning. Failure to thrive and protein calorie malnutrition: Patient was seen by palliative care. Maximize nutrition, needs long-term placement. Continue comfort feeds now.   Open wound right upper arm:  Multiple scarring from prior ulceration. Continue Xeroform gauze dressing,   Continue tetracycline as tolerated   Hypomagnesemia: Remains low at 1.5 Will replace  Hypoglycemia -still dependent on dextrose containing fluids -Cont to encourage PO as tolerated    DVT prophylaxis: SCD's Code Status: DNR Family Communication: Pt in room, family not at bedside  Status is: Inpatient  Remains inpatient appropriate because: severity of illness   Consultants:  PCCM Urology Psychiatrty  Procedures:    Antimicrobials: Anti-infectives (From admission, onward)    Start     Dose/Rate Route Frequency Ordered Stop   10/14/21 1000  valACYclovir (VALTREX) tablet 1,000 mg        1,000 mg Oral Daily 10/13/21 1152     10/13/21 2000  tetracycline (SUMYCIN) capsule 500 mg        500 mg Oral 2 times daily 10/13/21 1152     10/12/21 0930  tetracycline (SUMYCIN) capsule 500 mg  Status:  Discontinued        500 mg Per Tube 2 times daily 10/12/21 0918 10/13/21 1152   10/10/21 1000  valACYclovir (VALTREX) tablet 1,000 mg  Status:  Discontinued        1,000 mg Per Tube Daily 10/09/21 1344 10/13/21 1152   10/09/21 2000  cefTRIAXone (ROCEPHIN) 2 g in sodium chloride 0.9 % 100 mL IVPB        2 g 200 mL/hr over 30 Minutes Intravenous Every 24 hours 10/09/21 1705 10/15/21 2143   10/07/21 1800  ceFEPIme (MAXIPIME) 2 g in sodium chloride 0.9 % 100 mL IVPB  Status:  Discontinued        2 g 200 mL/hr over 30 Minutes Intravenous Every 8 hours 10/07/21 0954 10/09/21 1705   10/07/21 1230  bictegravir-emtricitabine-tenofovir AF (BIKTARVY) 50-200-25 MG per tablet 1 tablet        1 tablet Oral Daily 10/07/21 1138     10/07/21 1200  metroNIDAZOLE (FLAGYL) IVPB 500 mg  Status:  Discontinued         500 mg 100 mL/hr over 60 Minutes Intravenous Every 12 hours 10/07/21 0951 10/08/21 1601   10/07/21 1200  vancomycin (VANCOREADY) IVPB 1750 mg/350 mL  Status:  Discontinued        1,750 mg 175 mL/hr over 120 Minutes Intravenous Every 12 hours 10/07/21 0958 10/09/21 1705   10/07/21 1100  ceFEPIme (MAXIPIME) 2 g in sodium chloride 0.9 % 100 mL IVPB        2 g 200 mL/hr over 30 Minutes Intravenous  Once 10/07/21 0952 10/07/21 1236   10/07/21 1000  valACYclovir (VALTREX) tablet 1,000 mg  Status:  Discontinued        1,000 mg Oral Daily 10/07/21 0232 10/09/21 1344   10/06/21 2000  piperacillin-tazobactam (ZOSYN) IVPB 3.375 g        3.375 g 100 mL/hr over 30 Minutes Intravenous  Once 10/06/21 1954 10/06/21 2238   10/06/21 2000  vancomycin (VANCOREADY) IVPB 2000 mg/400 mL        2,000 mg 200 mL/hr over 120 Minutes Intravenous  Once 10/06/21 1955 10/06/21 2350       Subjective: Eager to go home soon  Objective: Vitals:  10/21/21 0811 10/21/21 0958 10/21/21 1002 10/21/21 1454  BP:  108/85 127/72 106/79  Pulse:  81 80 89  Resp:  14 14 15   Temp:  (!) 97.5 F (36.4 C) 97.9 F (36.6 C) (!) 97.5 F (36.4 C)  TempSrc:  Oral Oral Oral  SpO2: 98% 100% 100% 99%  Weight:      Height:        Intake/Output Summary (Last 24 hours) at 10/21/2021 1752 Last data filed at 10/21/2021 1751 Gross per 24 hour  Intake 3845.32 ml  Output 1775 ml  Net 2070.32 ml    Filed Weights   10/18/21 0500 10/19/21 0500 10/20/21 0500  Weight: 113 kg 113.5 kg 110.4 kg    Examination: General exam: Conversant, in no acute distress Respiratory system: normal chest rise, clear, no audible wheezing Cardiovascular system: regular rhythm, s1-s2 Gastrointestinal system: Nondistended, nontender, pos BS Central nervous system: No seizures, no tremors Extremities: No cyanosis, no joint deformities Skin: No rashes, no pallor Psychiatry: Affect normal // no auditory hallucinations   Data Reviewed: I have  personally reviewed following labs and imaging studies  CBC: Recent Labs  Lab 10/16/21 0443 10/17/21 0500 10/18/21 0500 10/20/21 0640 10/21/21 0500  WBC 11.5* 10.9* 11.9* 13.0* 13.1*  HGB 8.2* 8.1* 9.4* 8.8* 8.2*  HCT 27.7* 27.7* 30.9* 29.2* 26.8*  MCV 102.2* 104.5* 101.3* 102.1* 100.0  PLT 105* 118* 159 143* 008    Basic Metabolic Panel: Recent Labs  Lab 10/15/21 0354 10/16/21 0443 10/17/21 0813 10/18/21 0500 10/20/21 0640 10/21/21 0500  NA 148* 146* 141  --  136 136  K 3.8 4.0 4.0  --  4.1 4.3  CL 116* 116* 115*  --  105 103  CO2 27 27 26   --  26 27  GLUCOSE 138* 94 104*  --  78 87  BUN 21* 14 12  --  7 7  CREATININE 0.91 0.77 0.66  --  0.76 0.85  CALCIUM 8.6* 8.4* 7.9*  --  8.1* 8.2*  MG  --  1.6* 1.6* 1.6* 1.5* 2.6*  PHOS  --  3.3 3.1  --  3.8  --     GFR: Estimated Creatinine Clearance: 159.7 mL/min (by C-G formula based on SCr of 0.85 mg/dL). Liver Function Tests: Recent Labs  Lab 10/21/21 0500  AST 21  ALT 21  ALKPHOS 179*  BILITOT 0.7  PROT 5.9*  ALBUMIN <1.5*    No results for input(s): LIPASE, AMYLASE in the last 168 hours. No results for input(s): AMMONIA in the last 168 hours. Coagulation Profile: No results for input(s): INR, PROTIME in the last 168 hours.  Cardiac Enzymes: No results for input(s): CKTOTAL, CKMB, CKMBINDEX, TROPONINI in the last 168 hours. BNP (last 3 results) No results for input(s): PROBNP in the last 8760 hours. HbA1C: No results for input(s): HGBA1C in the last 72 hours. CBG: Recent Labs  Lab 10/21/21 0408 10/21/21 0853 10/21/21 0940 10/21/21 1200 10/21/21 1542  GLUCAP 83 68* 88 74 81    Lipid Profile: No results for input(s): CHOL, HDL, LDLCALC, TRIG, CHOLHDL, LDLDIRECT in the last 72 hours. Thyroid Function Tests: No results for input(s): TSH, T4TOTAL, FREET4, T3FREE, THYROIDAB in the last 72 hours. Anemia Panel: No results for input(s): VITAMINB12, FOLATE, FERRITIN, TIBC, IRON, RETICCTPCT in the last  72 hours. Sepsis Labs: No results for input(s): PROCALCITON, LATICACIDVEN in the last 168 hours.  No results found for this or any previous visit (from the past 240 hour(s)).   Radiology Studies:  No results found.  Scheduled Meds:  (feeding supplement) PROSource Plus  30 mL Oral QID   vitamin C  500 mg Oral BID   bictegravir-emtricitabine-tenofovir AF  1 tablet Oral Daily   chlorhexidine  15 mL Mouth Rinse BID   Chlorhexidine Gluconate Cloth  6 each Topical Q0600   escitalopram  20 mg Oral Daily   famotidine  20 mg Oral BID   feeding supplement  237 mL Oral BID BM   folic acid  1 mg Oral Daily   Gerhardt's butt cream   Topical BID   lactulose  10 g Oral TID   mouth rinse  15 mL Mouth Rinse q12n4p   midodrine  5 mg Oral TID WC   multivitamin with minerals  1 tablet Oral Daily   mupirocin ointment   Nasal BID   nicotine  14 mg Transdermal Daily   OLANZapine zydis  5 mg Oral QHS   pantoprazole  40 mg Oral BID   potassium chloride  40 mEq Oral Daily   sodium chloride flush  10-40 mL Intracatheter Q12H   tetracycline  500 mg Oral BID   [START ON 10/23/2021] thiamine  250 mg Oral Daily   valACYclovir  1,000 mg Oral Daily   zinc sulfate  220 mg Oral Daily   Continuous Infusions:  sodium chloride Stopped (10/14/21 0548)   sodium chloride Stopped (10/21/21 0225)   dextrose 75 mL/hr at 10/21/21 1218   thiamine injection 400 mg (10/21/21 1032)     LOS: 14 days   Marylu Lund, MD [facility] Pager On Amion  If 7PM-7AM, please contact night-coverage 10/21/2021, 5:52 PM

## 2021-10-22 DIAGNOSIS — A419 Sepsis, unspecified organism: Secondary | ICD-10-CM | POA: Diagnosis not present

## 2021-10-22 DIAGNOSIS — L03317 Cellulitis of buttock: Secondary | ICD-10-CM | POA: Diagnosis not present

## 2021-10-22 DIAGNOSIS — R6521 Severe sepsis with septic shock: Secondary | ICD-10-CM | POA: Diagnosis not present

## 2021-10-22 DIAGNOSIS — J690 Pneumonitis due to inhalation of food and vomit: Secondary | ICD-10-CM | POA: Diagnosis not present

## 2021-10-22 LAB — GLUCOSE, CAPILLARY
Glucose-Capillary: 76 mg/dL (ref 70–99)
Glucose-Capillary: 80 mg/dL (ref 70–99)
Glucose-Capillary: 81 mg/dL (ref 70–99)
Glucose-Capillary: 85 mg/dL (ref 70–99)
Glucose-Capillary: 88 mg/dL (ref 70–99)
Glucose-Capillary: 90 mg/dL (ref 70–99)

## 2021-10-22 MED ORDER — FUROSEMIDE 10 MG/ML IJ SOLN
40.0000 mg | Freq: Two times a day (BID) | INTRAMUSCULAR | Status: DC
  Administered 2021-10-22 – 2021-10-27 (×11): 40 mg via INTRAVENOUS
  Filled 2021-10-22 (×11): qty 4

## 2021-10-22 NOTE — Progress Notes (Signed)
Blood Glucose: 90 Time: 20:17 Notified: Lovey Newcomer, NP per orders  Time Notified: 20:18

## 2021-10-22 NOTE — Evaluation (Signed)
Physical Therapy Evaluation Patient Details Name: [name] MRN: [id number] DOB: 08/25/1986 Today's Date: 10/22/2021  History of Present Illness  36 yo male admitted from home with c/o buttock wounds, and dx with FTT and acute encephalopathy.  Pt with hx of HIV, PTSD, schizoaffective d/o, Sz, L hip AVN, neuropathy, polysubstance abuse  Clinical Impression  Pt admitted as above and presenting with functional mobility limitations 2* generalized weakness, balance deficits, and limited endurance.  This date, pt assisted to standing for nursing to assist with hygiene and dressing change and then ambulated limited distance in room and up to sit in chair.  Pt would benefit from follow up rehab at SNF level to maximize IND and safety but pt states he will likely prefer to return home with intermittent assist of aide and family.  If pt returns home, HHPT would be beneficial.     Recommendations for follow up therapy are one component of a multi-disciplinary discharge planning process, led by the attending physician.  Recommendations may be updated based on patient status, additional functional criteria and insurance authorization.  Follow Up Recommendations Skilled nursing-short term rehab (<3 hours/day) (Pt states he will likely prefer home with [facility] services)    Assistance Recommended at Discharge Frequent or constant Supervision/Assistance  Patient can return home with the following  A little help with walking and/or transfers;Assistance with cooking/housework;A lot of help with bathing/dressing/bathroom;Help with stairs or ramp for entrance    Equipment Recommendations None recommended by PT  Recommendations for Other Services  OT consult    Functional Status Assessment Patient has had a recent decline in their functional status and demonstrates the ability to make significant improvements in function in a reasonable and predictable amount of time.     Precautions / Restrictions  Precautions Precautions: Fall;Other (comment) Precaution Comments: rectal tube in place Restrictions Weight Bearing Restrictions: No      Mobility  Bed Mobility Overal bed mobility: Needs Assistance Bed Mobility: Supine to Sit     Supine to sit: Min assist;Mod assist     General bed mobility comments: Pt requires assist to bring legs over side of bed but brings trunk to upright unassisted. Ltd assist to bring bed pad with pt 2* presence of rectal tube as pt eased to EOB sitting    Transfers Overall transfer level: Needs assistance Equipment used: Rolling walker (2 wheels) Transfers: Sit to/from Stand Sit to Stand: Min assist           General transfer comment: Cues for use of UEs to self assist.  Min assist to bring wt up and fwd and to steady in initial standing.    Ambulation/Gait Ambulation/Gait assistance: Min assist Gait Distance (Feet): 8 Feet Assistive device: Rolling walker (2 wheels) Gait Pattern/deviations: Step-to pattern;Decreased step length - right;Decreased step length - left;Shuffle;Trunk flexed Gait velocity: decr     General Gait Details: cues for posture and position from RW; steady assist for balance and to manage RW  Stairs            Wheelchair Mobility    Modified Rankin (Stroke Patients Only)       Balance Overall balance assessment: Needs assistance Sitting-balance support: No upper extremity supported;Feet supported Sitting balance-Leahy Scale: Good     Standing balance support: No upper extremity supported Standing balance-Leahy Scale: Fair  Pertinent Vitals/Pain Pain Assessment: 0-10 Pain Score: 3  Pain Location: low back, rectum Pain Descriptors / Indicators: Sore Pain Intervention(s): Limited activity within patient's tolerance;Monitored during session    Home Living Family/patient expects to be discharged to:: Private residence Living Arrangements: Alone Available Help  at Discharge: Family;Available PRN/intermittently;Personal care attendant Type of Home: Apartment Home Access: Stairs to enter Entrance Stairs-Rails: Right Entrance Stairs-Number of Steps: 3   Home Layout: One level Home Equipment: Conservation officer, nature (2 wheels);Cane - single point;Wheelchair - manual;Hand held shower head;Tub bench;BSC/3in1;Hospital bed Additional Comments: Pt reports having a lift recliner.    Prior Function Prior Level of Function : History of Falls (last six months)             Mobility Comments: uses RW for ambulation, endorses 2 falls, one from tripping and the other pt does not recall reason. ADLs Comments: Pt reports aide 3 hrs a day and 5 days a week but can be 7 days. Pt reports that his Aide does "everything" for him including assisting him out of bed (Family assists on Sat/Sun), assist with standing, and with dressing, bathing and toieting. Pt uses a urinal for bed level toileting at night when he is alone. Aide performs IADLs of cleaning,laundry and cooking.  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     Hand Dominance   Dominant Hand: Right    Extremity/Trunk Assessment   Upper Extremity Assessment Upper Extremity Assessment: Generalized weakness    Lower Extremity Assessment Lower Extremity Assessment: Generalized weakness       Communication   Communication: No difficulties  Cognition Arousal/Alertness: Awake/alert Behavior During Therapy: WFL for tasks assessed/performed Overall Cognitive Status: Within Functional Limits for tasks assessed                                          General Comments      Exercises     Assessment/Plan    PT Assessment Patient needs continued PT services  PT Problem List Decreased strength;Decreased activity tolerance;Decreased balance;Decreased mobility;Decreased knowledge of use of DME;Pain        PT Treatment Interventions DME instruction;Gait training;Stair training;Functional mobility training;Therapeutic activities;Therapeutic exercise;Balance training;Patient/family education    PT Goals (Current goals can be found in the Care Plan section)  Acute Rehab PT Goals Patient Stated Goal: REgain IND to return home PT Goal Formulation: With patient Time For Goal Achievement: 11/05/21 Potential to Achieve Goals: Fair    Frequency Min 3X/week     Co-evaluation               AM-PAC PT "6 Clicks" Mobility  Outcome Measure Help needed turning from your back to your side while in a flat bed without using bedrails?: A Little Help needed moving from lying on your back to sitting on the side of a flat bed without using bedrails?: A Lot Help needed moving to and from a bed to a chair (including a wheelchair)?: A Little Help needed standing up from a chair using your arms (e.g., wheelchair or bedside chair)?: A Little Help needed to walk in hospital room?: Total Help needed climbing 3-5 steps with a railing? : Total 6 Click Score: 13    End of Session Equipment Utilized During Treatment: Gait belt Activity Tolerance: Patient tolerated treatment well;Patient limited  by fatigue Patient left: in chair;with call bell/phone within reach;with nursing/sitter in room Nurse Communication: Mobility status PT Visit Diagnosis: Difficulty in walking, not elsewhere classified (R26.2)    Time: 7955-8316 PT Time Calculation (min) (ACUTE ONLY): 26 min   Charges:   PT Evaluation $PT Eval Low Complexity: 1 Low PT Treatments $Therapeutic Activity: 8-22 mins        Debe Coder PT Acute Rehabilitation Services Pager [phone] Office [phone]   Markeya Mincy 10/22/2021, 6:05 PM

## 2021-10-22 NOTE — Progress Notes (Signed)
Rectal tube appeared to be leaking stool around the rectum unto the bed sheets.  Reconnected bag and tightened connectors. After checking volume in rectal tube, administered 10 cc of sterile water to rectal tube to equal 45 cc.  Pt was able to stand with assistance, using rolling walker and sit in chair for about 20 min , while cleaning up the soiled bed, bed rails , and pillows.

## 2021-10-22 NOTE — Consult Note (Signed)
°  Colon [name]  presented as a pleasant, down to earth male  who cooperatively participated in the interview. [name] was alert and well oriented, with no overt signs of cognitive difficulty or sedation potentially associated with medication use.  Patient is advised due to tube feeds, decrease in oral intake it is recommended he discontinue Latuda as this medication is ineffective without heavy meal of 350 cal or more.  He verbalized understanding.His speech was of normal rate, tone, volume and fluency. His mood and affect were euthymic, modulating appropriately in accord with what he discussed. [name] responses to questions were relevant and to the point, with thought processes linear and goal directed, with no signs of thought disorder, flight of ideas, suicidal or paranoid ideation.  Patient did express concerns about discontinuing his Latuda, however they were addressed.  At this time will psychiatrically clear patient.  Please reconsult if there are any additional concerns regarding patient, change in mental status, and or new onset of aggressive behaviors.  Today upon evaluation patient is appropriate and alert. He appears to be engaging well with staff, parents and Probation officer. Overall patient reports much improvement of symptoms at this time as evident by his interaction with team, decrease in depressive symptoms , agitation and anxiety.    He denies any suicidal thoughts, homicidal thoughts, and or auditory visual hallucinations.  He does not appear to be responding to internal stimuli.  He has had no urges to self-harm while on the unit.  He is able to contract for safety at this time.   -Psych cleared  -Continue current medications

## 2021-10-22 NOTE — Progress Notes (Signed)
PROGRESS NOTE    [name]  XJO:[id number] DOB: [birth year] DOA: 10/06/2021 PCP: [name], MD    Brief Narrative:  36 years old African-American male with PMH significant for tobacco abuse, HIV+ , bipolar disorder, hypertension, migraine, peripheral neuropathy, PTSD, schizoaffective disorder, seizure disorder presented in the ED with complaints of sores on his buttocks.  Patient had a visit from home health services who raised the concern that his sores have become infected.  Patient was noted to be hypothermic on arrival.  Labs in the ER showed lactic acid 6.4, potassium 2.7, abnormal LFTs.  Patient was hypotensive requiring pressor support.  Patient was agitated and restless requiring Precedex.  Subsequently patient was intubated and admitted in the ICU.  He is now off pressors, off Precedex drip. Septic shock resolved.  Patient continued to remain confused and has no capacity to make decisions. Palliative care consulted to discuss goals of care.  Mother is understanding that he has poor prognosis.  She is receptive to consider the options of hospice. PCCM pickup 10/15/2021.  Patient is currently on room air, he is on comfort feeds,  he has a Foley catheter, remains afebrile, He is very deconditioned  Assessment & Plan:   Active Problems:   Schizoaffective disorder, depressive type (HCC)   Hypokalemia   Elevated LFTs   Acute encephalopathy   Macrocytic anemia   Thrombocytopenia (HCC)   Prolonged QT interval   HIV positive (HCC)   Avascular necrosis of femoral head, left (HCC)   Hepatic steatosis   Cellulitis of multiple sites of buttock   Protein-calorie malnutrition, severe (HCC)   DNR (do not resuscitate)   Aspiration pneumonia ([place])   FTT (failure to thrive) in adult   Dysphagia   Coagulopathy ([place])   Palliative care by specialist   Malnutrition of moderate degree  Septic shock sec. to E. coli UTI /  LE ulcerations: Patient completed course of antibiotics on  10/15/2021. Sepsis physiology resolved, Continue midodrine. Foley has since been removed, voiding well   Acute on chronic metabolic encephalopathy: Complicated by bipolar disorder, schizoaffective, EtOH abuse, opioid dependence and PTSD, Now off precedex Avoid antipsychotics such as Haldol or Seroquel given QTC issues. Continue Lexapro, Xanax, Continue lactulose, Back to baseline mental status with intermittent agitation and restlessness requiring antipsychotics. Palliative care started Zyprexa ODT as needed. Psychiatry consulted, recommended to discontinue Latuda, reduce Xanax and continue Zyprexa ODT and Lexapro. Remains conversant. Recently, pt has demonstrated insight to his healthcare. Pt is able to verbalize to me the reason he is in hospital, what treatment entailed, what we are currently treating, and what the risks are if he were to leave prematurely. Pt is a/o x3 and is able to make medical decisions at this time Re-evaluated by Psych today, noted to be able to contract for safety at this time   Chronic hypotension : Completed stress dose steroids. Continue midodrine 5 mg 3 times daily. BP remains stable at this time   Epistaxis: > Improved. Suspect bleeding from repeated NG tube insertions in context of thrombocytopenia and coagulopathy. Continue aspiration precautions.   Acute on chronic dysphagia: Patient failed dysphagia 3 diet, He continued to pull NG tube even when heavily sedated. Speech consulted recommended MBS. Continue sips with direct supervision.  Speech recommended thick liquids.   EtOH cirrhosis: Chronic elevated ammonia: Continue lactulose as tolerated Mentation seems much improved Cont with rectal tube with stool output.  Goal is 2-3 BM per day, titrating lactulose   HIV: Continue Biktarvy.  Mild coagulopathy: Suspect due to chronic liver disease.   Chronic anemia / thrombocytopenia: Could be due to EtOH abuse. S/p 2 units of platelets and 3  unit PRBC. Hemoglobin stable, Continue to hold anticoagulation given hemoglobin drop.  Continue SCDs as tolerated   Severe deconditioning. Failure to thrive and protein calorie malnutrition: Patient was seen by palliative care. Maximize nutrition, needs long-term placement. Cont diet as tolerated   Open wound right upper arm:  Multiple scarring from prior ulceration. Continue Xeroform gauze dressing,   Continue tetracycline as tolerated   Hypomagnesemia: Remains low at 1.5 Will replace  Hypoglycemia -still dependent on dextrose containing fluids -Cont to encourage PO as tolerated, liberalize diet as tolerated -Begin weaning down d10 basal IVF  Anasarca -Gross edema throughout -Suspect related to third spacing from malnutrition with albumin <1.5 -Giving trial of lasix    DVT prophylaxis: SCD's Code Status: DNR Family Communication: Pt in room, family not at bedside  Status is: Inpatient  Remains inpatient appropriate because: severity of illness   Consultants:  PCCM Urology Psychiatrty  Procedures:    Antimicrobials: Anti-infectives (From admission, onward)    Start     Dose/Rate Route Frequency Ordered Stop   10/14/21 1000  valACYclovir (VALTREX) tablet 1,000 mg        1,000 mg Oral Daily 10/13/21 1152     10/13/21 2000  tetracycline (SUMYCIN) capsule 500 mg        500 mg Oral 2 times daily 10/13/21 1152     10/12/21 0930  tetracycline (SUMYCIN) capsule 500 mg  Status:  Discontinued        500 mg Per Tube 2 times daily 10/12/21 0918 10/13/21 1152   10/10/21 1000  valACYclovir (VALTREX) tablet 1,000 mg  Status:  Discontinued        1,000 mg Per Tube Daily 10/09/21 1344 10/13/21 1152   10/09/21 2000  cefTRIAXone (ROCEPHIN) 2 g in sodium chloride 0.9 % 100 mL IVPB        2 g 200 mL/hr over 30 Minutes Intravenous Every 24 hours 10/09/21 1705 10/15/21 2143   10/07/21 1800  ceFEPIme (MAXIPIME) 2 g in sodium chloride 0.9 % 100 mL IVPB  Status:  Discontinued         2 g 200 mL/hr over 30 Minutes Intravenous Every 8 hours 10/07/21 0954 10/09/21 1705   10/07/21 1230  bictegravir-emtricitabine-tenofovir AF (BIKTARVY) 50-200-25 MG per tablet 1 tablet        1 tablet Oral Daily 10/07/21 1138     10/07/21 1200  metroNIDAZOLE (FLAGYL) IVPB 500 mg  Status:  Discontinued        500 mg 100 mL/hr over 60 Minutes Intravenous Every 12 hours 10/07/21 0951 10/08/21 1601   10/07/21 1200  vancomycin (VANCOREADY) IVPB 1750 mg/350 mL  Status:  Discontinued        1,750 mg 175 mL/hr over 120 Minutes Intravenous Every 12 hours 10/07/21 0958 10/09/21 1705   10/07/21 1100  ceFEPIme (MAXIPIME) 2 g in sodium chloride 0.9 % 100 mL IVPB        2 g 200 mL/hr over 30 Minutes Intravenous  Once 10/07/21 0952 10/07/21 1236   10/07/21 1000  valACYclovir (VALTREX) tablet 1,000 mg  Status:  Discontinued        1,000 mg Oral Daily 10/07/21 0232 10/09/21 1344   10/06/21 2000  piperacillin-tazobactam (ZOSYN) IVPB 3.375 g        3.375 g 100 mL/hr over 30 Minutes Intravenous  Once 10/06/21 1954  10/06/21 2238   10/06/21 2000  vancomycin (VANCOREADY) IVPB 2000 mg/400 mL        2,000 mg 200 mL/hr over 120 Minutes Intravenous  Once 10/06/21 1955 10/06/21 2350       Subjective: Reports feeling better. Eager to go home soon  Objective: Vitals:   10/21/21 1454 10/21/21 2017 10/22/21 0425 10/22/21 1429  BP: 106/79 105/78 112/79 111/82  Pulse: 89 84 95 88  Resp: 15 16 16 15   Temp: (!) 97.5 F (36.4 C) 97.9 F (36.6 C) (!) 97.4 F (36.3 C) 97.9 F (36.6 C)  TempSrc: Oral Oral Oral Oral  SpO2: 99% 99% 97% 97%  Weight:      Height:        Intake/Output Summary (Last 24 hours) at 10/22/2021 1510 Last data filed at 10/22/2021 1450 Gross per 24 hour  Intake 120 ml  Output 2700 ml  Net -2580 ml    Filed Weights   10/18/21 0500 10/19/21 0500 10/20/21 0500  Weight: 113 kg 113.5 kg 110.4 kg    Examination: General exam: Awake, laying in bed, in nad Respiratory system: Normal  respiratory effort, no wheezing Cardiovascular system: regular rate, s1, s2 Gastrointestinal system: Soft, nondistended, positive BS Central nervous system: CN2-12 grossly intact, strength intact Extremities: Perfused, no clubbing, BLE edema Skin: Normal skin turgor, no notable skin lesions seen Psychiatry: Mood normal // no visual hallucinations   Data Reviewed: I have personally reviewed following labs and imaging studies  CBC: Recent Labs  Lab 10/16/21 0443 10/17/21 0500 10/18/21 0500 10/20/21 0640 10/21/21 0500  WBC 11.5* 10.9* 11.9* 13.0* 13.1*  HGB 8.2* 8.1* 9.4* 8.8* 8.2*  HCT 27.7* 27.7* 30.9* 29.2* 26.8*  MCV 102.2* 104.5* 101.3* 102.1* 100.0  PLT 105* 118* 159 143* 865    Basic Metabolic Panel: Recent Labs  Lab 10/16/21 0443 10/17/21 0813 10/18/21 0500 10/20/21 0640 10/21/21 0500  NA 146* 141  --  136 136  K 4.0 4.0  --  4.1 4.3  CL 116* 115*  --  105 103  CO2 27 26  --  26 27  GLUCOSE 94 104*  --  78 87  BUN 14 12  --  7 7  CREATININE 0.77 0.66  --  0.76 0.85  CALCIUM 8.4* 7.9*  --  8.1* 8.2*  MG 1.6* 1.6* 1.6* 1.5* 2.6*  PHOS 3.3 3.1  --  3.8  --     GFR: Estimated Creatinine Clearance: 159.7 mL/min (by C-G formula based on SCr of 0.85 mg/dL). Liver Function Tests: Recent Labs  Lab 10/21/21 0500  AST 21  ALT 21  ALKPHOS 179*  BILITOT 0.7  PROT 5.9*  ALBUMIN <1.5*    No results for input(s): LIPASE, AMYLASE in the last 168 hours. No results for input(s): AMMONIA in the last 168 hours. Coagulation Profile: No results for input(s): INR, PROTIME in the last 168 hours.  Cardiac Enzymes: No results for input(s): CKTOTAL, CKMB, CKMBINDEX, TROPONINI in the last 168 hours. BNP (last 3 results) No results for input(s): PROBNP in the last 8760 hours. HbA1C: No results for input(s): HGBA1C in the last 72 hours. CBG: Recent Labs  Lab 10/21/21 2014 10/21/21 2353 10/22/21 0423 10/22/21 0751 10/22/21 1146  GLUCAP 76 86 88 81 90    Lipid  Profile: No results for input(s): CHOL, HDL, LDLCALC, TRIG, CHOLHDL, LDLDIRECT in the last 72 hours. Thyroid Function Tests: No results for input(s): TSH, T4TOTAL, FREET4, T3FREE, THYROIDAB in the last 72 hours. Anemia Panel: No  results for input(s): VITAMINB12, FOLATE, FERRITIN, TIBC, IRON, RETICCTPCT in the last 72 hours. Sepsis Labs: No results for input(s): PROCALCITON, LATICACIDVEN in the last 168 hours.  No results found for this or any previous visit (from the past 240 hour(s)).   Radiology Studies: No results found.  Scheduled Meds:  (feeding supplement) PROSource Plus  30 mL Oral QID   vitamin C  500 mg Oral BID   bictegravir-emtricitabine-tenofovir AF  1 tablet Oral Daily   chlorhexidine  15 mL Mouth Rinse BID   Chlorhexidine Gluconate Cloth  6 each Topical Q0600   escitalopram  20 mg Oral Daily   famotidine  20 mg Oral BID   feeding supplement  237 mL Oral BID BM   folic acid  1 mg Oral Daily   furosemide  40 mg Intravenous BID   Gerhardt's butt cream   Topical BID   lactulose  10 g Oral TID   mouth rinse  15 mL Mouth Rinse q12n4p   midodrine  5 mg Oral TID WC   multivitamin with minerals  1 tablet Oral Daily   mupirocin ointment   Nasal BID   nicotine  14 mg Transdermal Daily   OLANZapine zydis  5 mg Oral QHS   pantoprazole  40 mg Oral BID   potassium chloride  40 mEq Oral Daily   sodium chloride flush  10-40 mL Intracatheter Q12H   tetracycline  500 mg Oral BID   [START ON 10/23/2021] thiamine  250 mg Oral Daily   valACYclovir  1,000 mg Oral Daily   zinc sulfate  220 mg Oral Daily   Continuous Infusions:  sodium chloride Stopped (10/14/21 0548)   sodium chloride Stopped (10/21/21 0225)   dextrose 75 mL/hr at 10/21/21 1218   thiamine injection 400 mg (10/22/21 0901)     LOS: 15 days   Marylu Lund, MD [facility] Pager On Amion  If 7PM-7AM, please contact night-coverage 10/22/2021, 3:10 PM

## 2021-10-23 DIAGNOSIS — M87052 Idiopathic aseptic necrosis of left femur: Secondary | ICD-10-CM | POA: Diagnosis not present

## 2021-10-23 DIAGNOSIS — A419 Sepsis, unspecified organism: Secondary | ICD-10-CM | POA: Diagnosis not present

## 2021-10-23 DIAGNOSIS — R6521 Severe sepsis with septic shock: Secondary | ICD-10-CM | POA: Diagnosis not present

## 2021-10-23 DIAGNOSIS — G9340 Encephalopathy, unspecified: Secondary | ICD-10-CM | POA: Diagnosis not present

## 2021-10-23 LAB — COMPREHENSIVE METABOLIC PANEL
ALT: 25 U/L (ref 0–44)
AST: 24 U/L (ref 15–41)
Albumin: <1.5 g/dL — ABNORMAL LOW (ref 3.5–5.0)
Alkaline Phosphatase: 211 U/L — ABNORMAL HIGH (ref 38–126)
Anion gap: 8 (ref 5–15)
BUN: 9 mg/dL (ref 6–20)
CO2: 31 mmol/L (ref 22–32)
Calcium: 8.1 mg/dL — ABNORMAL LOW (ref 8.9–10.3)
Chloride: 96 mmol/L — ABNORMAL LOW (ref 98–111)
Creatinine, Ser: 0.92 mg/dL (ref 0.61–1.24)
GFR, Estimated: >60 mL/min (ref >60)
Glucose, Bld: 84 mg/dL (ref 70–99)
Potassium: 3.4 mmol/L — ABNORMAL LOW (ref 3.5–5.1)
Sodium: 135 mmol/L (ref 135–145)
Total Bilirubin: 0.6 mg/dL (ref 0.3–1.2)
Total Protein: 6.3 g/dL — ABNORMAL LOW (ref 6.5–8.1)

## 2021-10-23 LAB — CBC
HCT: 25.5 % — ABNORMAL LOW (ref 39.0–52.0)
Hemoglobin: 8 g/dL — ABNORMAL LOW (ref 13.0–17.0)
MCH: 30.8 pg (ref 26.0–34.0)
MCHC: 31.4 g/dL (ref 30.0–36.0)
MCV: 98.1 fL (ref 80.0–100.0)
Platelets: 181 10*3/uL (ref 150–400)
RBC: 2.6 MIL/uL — ABNORMAL LOW (ref 4.22–5.81)
RDW: 21.8 % — ABNORMAL HIGH (ref 11.5–15.5)
WBC: 11.3 10*3/uL — ABNORMAL HIGH (ref 4.0–10.5)
nRBC: 0 % (ref 0.0–0.2)

## 2021-10-23 LAB — GLUCOSE, CAPILLARY
Glucose-Capillary: 80 mg/dL (ref 70–99)
Glucose-Capillary: 81 mg/dL (ref 70–99)
Glucose-Capillary: 71 mg/dL (ref 70–99)
Glucose-Capillary: 85 mg/dL (ref 70–99)
Glucose-Capillary: 90 mg/dL (ref 70–99)

## 2021-10-23 NOTE — Progress Notes (Signed)
°  Blood Glucose: 76 Time: 23:53 Notified: Lovey Newcomer, NP per orders  Time Notified: 00:01

## 2021-10-23 NOTE — Evaluation (Signed)
Occupational Therapy Evaluation Patient Details Name: [name] MRN: [id number] DOB: 09/25/1986 Today's Date: 10/23/2021   History of Present Illness 36 yo male admitted from home with c/o buttock wounds, and dx with FTT and acute encephalopathy.  Pt with hx of HIV, PTSD, schizoaffective d/o, Sz, L hip AVN, neuropathy, polysubstance abuse   Clinical Impression   Patient admitted for the diagnosis above.  PTA he lives in an apartment with PRN PCA and family assist.  Deficts impacting independence are listed below.  Currently he is needing Min A with basic mobility and ADL completion. Patient is not overly receptive to OT assessment this date, and does not want OT in the home setting.  He is open to an upper body HEP, and continuing to mobilize.  OT can follow in the acute setting, with assist as needed at home from family and PCA.  PT recommended SNF, which would be beneficial given the PRN nature of assist at home, but he is not open to it.         Recommendations for follow up therapy are one component of a multi-disciplinary discharge planning process, led by the attending physician.  Recommendations may be updated based on patient status, additional functional criteria and insurance authorization.   Follow Up Recommendations  No OT follow up    Assistance Recommended at Discharge Set up Supervision/Assistance  Patient can return home with the following A little help with walking and/or transfers;A little help with bathing/dressing/bathroom;Assistance with cooking/housework;Assist for transportation    Functional Status Assessment  Patient has had a recent decline in their functional status and demonstrates the ability to make significant improvements in function in a reasonable and predictable amount of time.  Equipment Recommendations  None recommended by OT    Recommendations for Other Services       Precautions / Restrictions Precautions Precautions: Fall;Other  (comment) Precaution Comments: rectal tube in place Restrictions Weight Bearing Restrictions: No Other Position/Activity Restrictions: wounds to bottom      Mobility Bed Mobility Overal bed mobility: Needs Assistance Bed Mobility: Supine to Sit;Sit to Supine     Supine to sit: Min assist Sit to supine: Min assist   General bed mobility comments: increased time    Transfers Overall transfer level: Needs assistance Equipment used: Rolling walker (2 wheels) Transfers: Sit to/from Stand;Bed to chair/wheelchair/BSC Sit to Stand: Min assist     Step pivot transfers: Min assist            Balance Overall balance assessment: Needs assistance Sitting-balance support: No upper extremity supported;Feet supported Sitting balance-Leahy Scale: Good     Standing balance support: Reliant on assistive device for balance Standing balance-Leahy Scale: Fair                             ADL either performed or assessed with clinical judgement   ADL       Grooming: Wash/dry hands;Wash/dry face;Set up;Sitting           Upper Body Dressing : Sitting;Supervision/safety   Lower Body Dressing: Minimal assistance;Sitting/lateral leans   Toilet Transfer: Minimal assistance;Stand-pivot;BSC/3in1 Toilet Transfer Details (indicate cue type and reason): simulated to recliner and back                 Vision Patient Visual Report: No change from baseline       Perception Perception Perception: Not tested   Praxis Praxis Praxis: Not tested    Pertinent Vitals/Pain  Pain Assessment: Faces Faces Pain Scale: Hurts little more Pain Location: bottom Pain Descriptors / Indicators: Sore;Discomfort Pain Intervention(s): Monitored during session     Hand Dominance Right   Extremity/Trunk Assessment Upper Extremity Assessment Upper Extremity Assessment: Generalized weakness   Lower Extremity Assessment Lower Extremity Assessment: Defer to PT evaluation    Cervical / Trunk Assessment Cervical / Trunk Assessment: Kyphotic   Communication Communication Communication: No difficulties   Cognition Arousal/Alertness: Awake/alert Behavior During Therapy: WFL for tasks assessed/performed Overall Cognitive Status: Within Functional Limits for tasks assessed                                 General Comments: ? slowed mentation, slowed response to all questions.     General Comments       Exercises     Shoulder Instructions      Home Living Family/patient expects to be discharged to:: Private residence Living Arrangements: Alone Available Help at Discharge: Family;Available PRN/intermittently;Personal care attendant Type of Home: Apartment Home Access: Stairs to enter Entrance Stairs-Number of Steps: 3 Entrance Stairs-Rails: Right Home Layout: One level     Bathroom Shower/Tub: Teacher, early years/pre: Standard     Home Equipment: Conservation officer, nature (2 wheels);Cane - single point;Wheelchair - manual;Hand held shower head;Tub bench;BSC/3in1;Hospital bed          Prior Functioning/Environment Prior Level of Function : Needs assist       Physical Assist : Mobility (physical);ADLs (physical) Mobility (physical): Bed mobility;Transfers;Gait ADLs (physical): Bathing;Dressing;Toileting;IADLs            OT Problem List: Decreased strength;Decreased activity tolerance      OT Treatment/Interventions: Self-care/ADL training;Therapeutic exercise;Balance training;Patient/family education;Therapeutic activities    OT Goals(Current goals can be found in the care plan section) Acute Rehab OT Goals Patient Stated Goal: Go home OT Goal Formulation: With patient Time For Goal Achievement: 11/06/21 Potential to Achieve Goals: Good ADL Goals Pt Will Perform Grooming: with supervision;standing Pt Will Perform Lower Body Bathing: sit to/from stand;with supervision Pt Will Perform Lower Body Dressing: with  supervision;sit to/from stand Pt Will Transfer to Toilet: with supervision;ambulating;regular height toilet Pt/caregiver will Perform Home Exercise Program: Increased strength;Both right and left upper extremity;With theraband;With Supervision;With written HEP provided  OT Frequency: Min 2X/week    Co-evaluation              AM-PAC OT "6 Clicks" Daily Activity     Outcome Measure Help from another person eating meals?: None Help from another person taking care of personal grooming?: A Little Help from another person toileting, which includes using toliet, bedpan, or urinal?: A Little Help from another person bathing (including washing, rinsing, drying)?: A Little Help from another person to put on and taking off regular upper body clothing?: A Little Help from another person to put on and taking off regular lower body clothing?: A Little 6 Click Score: 19   End of Session Equipment Utilized During Treatment: Rolling walker (2 wheels)  Activity Tolerance: Patient limited by lethargy Patient left: in bed;with call bell/phone within reach  OT Visit Diagnosis: Unsteadiness on feet (R26.81);Muscle weakness (generalized) (M62.81)                Time: 4132-4401 OT Time Calculation (min): 18 min Charges:  OT General Charges $OT Visit: 1 Visit OT Evaluation $OT Eval Moderate Complexity: 1 Mod  10/23/2021  RP, OTR/L  Acute Rehabilitation Services  Office:  [place] 10/23/2021, 11:13 AM

## 2021-10-23 NOTE — Progress Notes (Signed)
PROGRESS NOTE    [name]  IDP:[id number] DOB: Oct 22, 1985 DOA: 10/06/2021 PCP: [name], MD    Brief Narrative:  36 years old African-American male with PMH significant for tobacco abuse, HIV+ , bipolar disorder, hypertension, migraine, peripheral neuropathy, PTSD, schizoaffective disorder, seizure disorder presented in the ED with complaints of sores on his buttocks.  Patient had a visit from home health services who raised the concern that his sores have become infected.  Patient was noted to be hypothermic on arrival.  Labs in the ER showed lactic acid 6.4, potassium 2.7, abnormal LFTs.  Patient was hypotensive requiring pressor support.  Patient was agitated and restless requiring Precedex.  Subsequently patient was intubated and admitted in the ICU.  He is now off pressors, off Precedex drip. Septic shock resolved.  Patient continued to remain confused and has no capacity to make decisions. Palliative care consulted to discuss goals of care.  Mother is understanding that he has poor prognosis.  She is receptive to consider the options of hospice. PCCM pickup 10/15/2021.  Patient is currently on room air, he is on comfort feeds,  he has a Foley catheter, remains afebrile, He is very deconditioned  Assessment & Plan:   Active Problems:   Schizoaffective disorder, depressive type (HCC)   Hypokalemia   Elevated LFTs   Acute encephalopathy   Macrocytic anemia   Thrombocytopenia (HCC)   Prolonged QT interval   HIV positive (HCC)   Avascular necrosis of femoral head, left (HCC)   Hepatic steatosis   Cellulitis of multiple sites of buttock   Protein-calorie malnutrition, severe (HCC)   DNR (do not resuscitate)   Aspiration pneumonia ([place])   FTT (failure to thrive) in adult   Dysphagia   Coagulopathy ([place])   Palliative care by specialist   Malnutrition of moderate degree  Septic shock sec. to E. coli UTI /  LE ulcerations: Patient completed course of antibiotics on  10/15/2021. Sepsis physiology resolved, Continue midodrine. Foley has since been removed, cont to void well   Acute on chronic metabolic encephalopathy: Complicated by bipolar disorder, schizoaffective, EtOH abuse, opioid dependence and PTSD, Now off precedex Avoid antipsychotics such as Haldol or Seroquel given QTC issues. Continue Lexapro, Xanax, Continue lactulose, Recently to baseline mental status with intermittent agitation and restlessness requiring antipsychotics. Palliative care started Zyprexa ODT as needed. Psychiatry consulted, recommended to discontinue Latuda, reduce Xanax and continue Zyprexa ODT and Lexapro. Remains conversant. Recently, pt had demonstrated insight to his healthcare and demonstrated ability to make decisions, see previous note Recently re-evaluated by Psych today, noted to be able to contract for safety at this time   Chronic hypotension : Completed stress dose steroids. Continue midodrine 5 mg 3 times daily. BP remains stable at this time   Epistaxis: > Improved. Suspect bleeding from repeated NG tube insertions in context of thrombocytopenia and coagulopathy. Continue aspiration precautions.   Acute on chronic dysphagia: Patient failed dysphagia 3 diet, He continued to pull NG tube even when heavily sedated. Speech consulted recommended MBS. Continue sips with direct supervision.  Speech recommended thick liquids.   EtOH cirrhosis: Chronic elevated ammonia: Has been refusing lactulose doses Cont with rectal tube with stool output.  Goal is 2-3 BM per day Most recent ammonia level of 123 on 1/1   HIV: Continue Biktarvy.   Mild coagulopathy: Suspect due to chronic liver disease.   Chronic anemia / thrombocytopenia: Could be due to EtOH abuse. S/p 2 units of platelets and 3 unit PRBC. Hemoglobin  stable, Continue to hold anticoagulation given hemoglobin drop.  Continue SCDs as tolerated   Severe deconditioning. Failure to thrive and  protein calorie malnutrition: Patient was seen by palliative care. Maximize nutrition, needs long-term placement. Cont diet as pt tolerates   Open wound right upper arm:  Multiple scarring from prior ulceration. Continue Xeroform gauze dressing,   Continue tetracycline as tolerated   Hypomagnesemia: Replaced  Hypoglycemia -still dependent on dextrose containing fluids -Cont to encourage PO as tolerated, liberalize diet as tolerated -remains dependent on d10 fluids -Have ordered insulin, proinsulin, c-peptide levels  Anasarca -Gross edema throughout -Suspect related to third spacing from malnutrition with albumin <1.5 -Improving with BID IV lasix   DVT prophylaxis: SCD's Code Status: DNR Family Communication: Pt in room, family not at bedside  Status is: Inpatient  Remains inpatient appropriate because: severity of illness   Consultants:  PCCM Urology Psychiatrty  Procedures:    Antimicrobials: Anti-infectives (From admission, onward)    Start     Dose/Rate Route Frequency Ordered Stop   10/14/21 1000  valACYclovir (VALTREX) tablet 1,000 mg        1,000 mg Oral Daily 10/13/21 1152     10/13/21 2000  tetracycline (SUMYCIN) capsule 500 mg        500 mg Oral 2 times daily 10/13/21 1152     10/12/21 0930  tetracycline (SUMYCIN) capsule 500 mg  Status:  Discontinued        500 mg Per Tube 2 times daily 10/12/21 0918 10/13/21 1152   10/10/21 1000  valACYclovir (VALTREX) tablet 1,000 mg  Status:  Discontinued        1,000 mg Per Tube Daily 10/09/21 1344 10/13/21 1152   10/09/21 2000  cefTRIAXone (ROCEPHIN) 2 g in sodium chloride 0.9 % 100 mL IVPB        2 g 200 mL/hr over 30 Minutes Intravenous Every 24 hours 10/09/21 1705 10/15/21 2143   10/07/21 1800  ceFEPIme (MAXIPIME) 2 g in sodium chloride 0.9 % 100 mL IVPB  Status:  Discontinued        2 g 200 mL/hr over 30 Minutes Intravenous Every 8 hours 10/07/21 0954 10/09/21 1705   10/07/21 1230   bictegravir-emtricitabine-tenofovir AF (BIKTARVY) 50-200-25 MG per tablet 1 tablet        1 tablet Oral Daily 10/07/21 1138     10/07/21 1200  metroNIDAZOLE (FLAGYL) IVPB 500 mg  Status:  Discontinued        500 mg 100 mL/hr over 60 Minutes Intravenous Every 12 hours 10/07/21 0951 10/08/21 1601   10/07/21 1200  vancomycin (VANCOREADY) IVPB 1750 mg/350 mL  Status:  Discontinued        1,750 mg 175 mL/hr over 120 Minutes Intravenous Every 12 hours 10/07/21 0958 10/09/21 1705   10/07/21 1100  ceFEPIme (MAXIPIME) 2 g in sodium chloride 0.9 % 100 mL IVPB        2 g 200 mL/hr over 30 Minutes Intravenous  Once 10/07/21 0952 10/07/21 1236   10/07/21 1000  valACYclovir (VALTREX) tablet 1,000 mg  Status:  Discontinued        1,000 mg Oral Daily 10/07/21 0232 10/09/21 1344   10/06/21 2000  piperacillin-tazobactam (ZOSYN) IVPB 3.375 g        3.375 g 100 mL/hr over 30 Minutes Intravenous  Once 10/06/21 1954 10/06/21 2238   10/06/21 2000  vancomycin (VANCOREADY) IVPB 2000 mg/400 mL        2,000 mg 200 mL/hr over 120 Minutes Intravenous  Once 10/06/21 1955 10/06/21 2350       Subjective: Not very conversant today  Objective: Vitals:   10/22/21 1429 10/22/21 2010 10/23/21 0437 10/23/21 0700  BP: 111/82 107/78 107/81   Pulse: 88 95 92   Resp: 15 18 18    Temp: 97.9 F (36.6 C) 97.9 F (36.6 C) 97.7 F (36.5 C)   TempSrc: Oral Oral Oral   SpO2: 97% 100% 96%   Weight:    79.8 kg  Height:        Intake/Output Summary (Last 24 hours) at 10/23/2021 1434 Last data filed at 10/23/2021 1027 Gross per 24 hour  Intake 240 ml  Output 4300 ml  Net -4060 ml    Filed Weights   10/19/21 0500 10/20/21 0500 10/23/21 0700  Weight: 113.5 kg 110.4 kg 79.8 kg    Examination: General exam: Not very conversant, in no acute distress Respiratory system: normal chest rise, clear, no audible wheezing Cardiovascular system: regular rhythm, s1-s2 Gastrointestinal system: Nondistended, nontender, pos  BS Central nervous system: No seizures, no tremors Extremities: No cyanosis, no joint deformities Skin: No rashes, no pallor Psychiatry: difficult to assess as pt is not very conversant  Data Reviewed: I have personally reviewed following labs and imaging studies  CBC: Recent Labs  Lab 10/17/21 0500 10/18/21 0500 10/20/21 0640 10/21/21 0500 10/23/21 0949  WBC 10.9* 11.9* 13.0* 13.1* 11.3*  HGB 8.1* 9.4* 8.8* 8.2* 8.0*  HCT 27.7* 30.9* 29.2* 26.8* 25.5*  MCV 104.5* 101.3* 102.1* 100.0 98.1  PLT 118* 159 143* 153 053    Basic Metabolic Panel: Recent Labs  Lab 10/17/21 0813 10/18/21 0500 10/20/21 0640 10/21/21 0500 10/23/21 0949  NA 141  --  136 136 135  K 4.0  --  4.1 4.3 3.4*  CL 115*  --  105 103 96*  CO2 26  --  26 27 31   GLUCOSE 104*  --  78 87 84  BUN 12  --  7 7 9   CREATININE 0.66  --  0.76 0.85 0.92  CALCIUM 7.9*  --  8.1* 8.2* 8.1*  MG 1.6* 1.6* 1.5* 2.6*  --   PHOS 3.1  --  3.8  --   --     GFR: Estimated Creatinine Clearance: 124.1 mL/min (by C-G formula based on SCr of 0.92 mg/dL). Liver Function Tests: Recent Labs  Lab 10/21/21 0500 10/23/21 0949  AST 21 24  ALT 21 25  ALKPHOS 179* 211*  BILITOT 0.7 0.6  PROT 5.9* 6.3*  ALBUMIN <1.5* <1.5*    No results for input(s): LIPASE, AMYLASE in the last 168 hours. No results for input(s): AMMONIA in the last 168 hours. Coagulation Profile: No results for input(s): INR, PROTIME in the last 168 hours.  Cardiac Enzymes: No results for input(s): CKTOTAL, CKMB, CKMBINDEX, TROPONINI in the last 168 hours. BNP (last 3 results) No results for input(s): PROBNP in the last 8760 hours. HbA1C: No results for input(s): HGBA1C in the last 72 hours. CBG: Recent Labs  Lab 10/22/21 2014 10/22/21 2353 10/23/21 0403 10/23/21 0737 10/23/21 1150  GLUCAP 80 76 80 81 71    Lipid Profile: No results for input(s): CHOL, HDL, LDLCALC, TRIG, CHOLHDL, LDLDIRECT in the last 72 hours. Thyroid Function Tests: No  results for input(s): TSH, T4TOTAL, FREET4, T3FREE, THYROIDAB in the last 72 hours. Anemia Panel: No results for input(s): VITAMINB12, FOLATE, FERRITIN, TIBC, IRON, RETICCTPCT in the last 72 hours. Sepsis Labs: No results for input(s): PROCALCITON, LATICACIDVEN in the last 168  hours.  No results found for this or any previous visit (from the past 240 hour(s)).   Radiology Studies: No results found.  Scheduled Meds:  (feeding supplement) PROSource Plus  30 mL Oral QID   vitamin C  500 mg Oral BID   bictegravir-emtricitabine-tenofovir AF  1 tablet Oral Daily   chlorhexidine  15 mL Mouth Rinse BID   Chlorhexidine Gluconate Cloth  6 each Topical Q0600   escitalopram  20 mg Oral Daily   famotidine  20 mg Oral BID   feeding supplement  237 mL Oral BID BM   folic acid  1 mg Oral Daily   furosemide  40 mg Intravenous BID   Gerhardt's butt cream   Topical BID   lactulose  10 g Oral TID   mouth rinse  15 mL Mouth Rinse q12n4p   midodrine  5 mg Oral TID WC   multivitamin with minerals  1 tablet Oral Daily   mupirocin ointment   Nasal BID   nicotine  14 mg Transdermal Daily   OLANZapine zydis  5 mg Oral QHS   pantoprazole  40 mg Oral BID   potassium chloride  40 mEq Oral Daily   sodium chloride flush  10-40 mL Intracatheter Q12H   tetracycline  500 mg Oral BID   thiamine  250 mg Oral Daily   valACYclovir  1,000 mg Oral Daily   zinc sulfate  220 mg Oral Daily   Continuous Infusions:  sodium chloride Stopped (10/14/21 0548)   sodium chloride Stopped (10/21/21 0225)   dextrose 75 mL/hr at 10/23/21 1022     LOS: 16 days   Marylu Lund, MD [facility] Pager On Amion  If 7PM-7AM, please contact night-coverage 10/23/2021, 2:34 PM

## 2021-10-23 NOTE — Progress Notes (Signed)
Pt's blood sugar 81 @ 0737. MD notified. Orders placed.

## 2021-10-24 DIAGNOSIS — A419 Sepsis, unspecified organism: Secondary | ICD-10-CM | POA: Diagnosis not present

## 2021-10-24 DIAGNOSIS — J690 Pneumonitis due to inhalation of food and vomit: Secondary | ICD-10-CM | POA: Diagnosis not present

## 2021-10-24 DIAGNOSIS — R451 Restlessness and agitation: Secondary | ICD-10-CM

## 2021-10-24 DIAGNOSIS — R6521 Severe sepsis with septic shock: Secondary | ICD-10-CM | POA: Diagnosis not present

## 2021-10-24 DIAGNOSIS — M87052 Idiopathic aseptic necrosis of left femur: Secondary | ICD-10-CM | POA: Diagnosis not present

## 2021-10-24 DIAGNOSIS — Z515 Encounter for palliative care: Secondary | ICD-10-CM | POA: Diagnosis not present

## 2021-10-24 LAB — COMPREHENSIVE METABOLIC PANEL
ALT: 22 U/L (ref 0–44)
AST: 22 U/L (ref 15–41)
Albumin: <1.5 g/dL — ABNORMAL LOW (ref 3.5–5.0)
Alkaline Phosphatase: 188 U/L — ABNORMAL HIGH (ref 38–126)
Anion gap: 9 (ref 5–15)
BUN: 7 mg/dL (ref 6–20)
CO2: 34 mmol/L — ABNORMAL HIGH (ref 22–32)
Calcium: 8.1 mg/dL — ABNORMAL LOW (ref 8.9–10.3)
Chloride: 94 mmol/L — ABNORMAL LOW (ref 98–111)
Creatinine, Ser: 0.83 mg/dL (ref 0.61–1.24)
GFR, Estimated: >60 mL/min (ref >60)
Glucose, Bld: 91 mg/dL (ref 70–99)
Potassium: 3.2 mmol/L — ABNORMAL LOW (ref 3.5–5.1)
Sodium: 137 mmol/L (ref 135–145)
Total Bilirubin: 0.8 mg/dL (ref 0.3–1.2)
Total Protein: 6 g/dL — ABNORMAL LOW (ref 6.5–8.1)

## 2021-10-24 LAB — GLUCOSE, CAPILLARY
Glucose-Capillary: 106 mg/dL — ABNORMAL HIGH (ref 70–99)
Glucose-Capillary: 121 mg/dL — ABNORMAL HIGH (ref 70–99)
Glucose-Capillary: 76 mg/dL (ref 70–99)
Glucose-Capillary: 88 mg/dL (ref 70–99)
Glucose-Capillary: 92 mg/dL (ref 70–99)
Glucose-Capillary: 93 mg/dL (ref 70–99)
Glucose-Capillary: 98 mg/dL (ref 70–99)

## 2021-10-24 LAB — AMMONIA: Ammonia: 30 umol/L (ref 9–35)

## 2021-10-24 LAB — CBC
HCT: 24 % — ABNORMAL LOW (ref 39.0–52.0)
Hemoglobin: 7.5 g/dL — ABNORMAL LOW (ref 13.0–17.0)
MCH: 30.5 pg (ref 26.0–34.0)
MCHC: 31.3 g/dL (ref 30.0–36.0)
MCV: 97.6 fL (ref 80.0–100.0)
Platelets: 189 10*3/uL (ref 150–400)
RBC: 2.46 MIL/uL — ABNORMAL LOW (ref 4.22–5.81)
RDW: 21.6 % — ABNORMAL HIGH (ref 11.5–15.5)
WBC: 10.5 10*3/uL (ref 4.0–10.5)
nRBC: 0 % (ref 0.0–0.2)

## 2021-10-24 LAB — INSULIN, RANDOM: Insulin: 1.2 u[IU]/mL — ABNORMAL LOW (ref 2.6–24.9)

## 2021-10-24 LAB — C-PEPTIDE: C-Peptide: 0.7 ng/mL — ABNORMAL LOW (ref 1.1–4.4)

## 2021-10-24 NOTE — Progress Notes (Signed)
PROGRESS NOTE    [name]  WUJ:[id number] DOB: [birth year] DOA: 10/06/2021 PCP: [name], MD    Brief Narrative:  36 years old African-American male with PMH significant for tobacco abuse, HIV+ , bipolar disorder, hypertension, migraine, peripheral neuropathy, PTSD, schizoaffective disorder, seizure disorder presented in the ED with complaints of sores on his buttocks.  Patient had a visit from home health services who raised the concern that his sores have become infected.  Patient was noted to be hypothermic on arrival.  Labs in the ER showed lactic acid 6.4, potassium 2.7, abnormal LFTs.  Patient was hypotensive requiring pressor support.  Patient was agitated and restless requiring Precedex.  Subsequently patient was intubated and admitted in the ICU.  He is now off pressors, off Precedex drip. Septic shock resolved.  Patient continued to remain confused and has no capacity to make decisions. Palliative care consulted to discuss goals of care.  Mother is understanding that he has poor prognosis.  She is receptive to consider the options of hospice. PCCM pickup 10/15/2021.  Patient is currently on room air, he is on comfort feeds,  he has a Foley catheter, remains afebrile, He is very deconditioned  Assessment & Plan:   Active Problems:   Schizoaffective disorder, depressive type (HCC)   Hypokalemia   Elevated LFTs   Acute encephalopathy   Macrocytic anemia   Thrombocytopenia (HCC)   Prolonged QT interval   HIV positive (HCC)   Avascular necrosis of femoral head, left (HCC)   Hepatic steatosis   Cellulitis of multiple sites of buttock   Protein-calorie malnutrition, severe (HCC)   DNR (do not resuscitate)   Aspiration pneumonia ([place])   FTT (failure to thrive) in adult   Dysphagia   Coagulopathy ([place])   Palliative care by specialist   Malnutrition of moderate degree  Septic shock sec. to E. coli UTI /  LE ulcerations: Patient completed course of antibiotics on  10/15/2021. Sepsis physiology resolved, Continue midodrine. Foley has since been removed, cont to void well   Acute on chronic metabolic encephalopathy: Complicated by bipolar disorder, schizoaffective, EtOH abuse, opioid dependence and PTSD, Now off precedex Avoid antipsychotics such as Haldol or Seroquel given QTC issues. Continue Lexapro, Xanax, Continue lactulose, Recently to baseline mental status with intermittent agitation and restlessness requiring antipsychotics. Palliative care started Zyprexa ODT as needed. Psychiatry consulted, recommended to discontinue Latuda, reduce Xanax and continue Zyprexa ODT and Lexapro. Recently re-evaluated by Psych, noted to be able to contract for safety at that time Over the weekend, pt noted to be largely non-verbal, now yelling religious statements, refusing medical treatment Will ask Psychiatry to re-evaluate tomorrow   Chronic hypotension : Completed stress dose steroids. Continue midodrine 5 mg 3 times daily. BP remains stable at this time   Epistaxis: > Improved. Suspect bleeding from repeated NG tube insertions in context of thrombocytopenia and coagulopathy. Continue aspiration precautions.   Acute on chronic dysphagia: Patient failed dysphagia 3 diet, He continued to pull NG tube even when heavily sedated. Speech consulted recommended MBS. Continue sips with direct supervision.  Speech recommended thick liquids.   EtOH cirrhosis: Chronic elevated ammonia: Has been refusing lactulose doses Cont with rectal tube with stool output.  Goal 2-3 BM daily Ammonia 30   HIV: Continue Biktarvy.   Mild coagulopathy: Suspect due to chronic liver disease.   Chronic anemia / thrombocytopenia: Could be due to EtOH abuse. S/p 2 units of platelets and 3 unit PRBC. Hemoglobin stable, Continue to hold anticoagulation given  hemoglobin drop.  Continue SCDs as tolerated   Severe deconditioning. Failure to thrive and protein calorie  malnutrition: Patient was seen by palliative care. Maximize nutrition, needs long-term placement. Cont diet as pt tolerates   Open wound right upper arm:  Multiple scarring from prior ulceration. Continue Xeroform gauze dressing,   Continue tetracycline as tolerated   Hypomagnesemia: Replaced  Hypoglycemia -still dependent on dextrose containing fluids -Cont to encourage PO as tolerated, liberalize diet as tolerated -remains dependent on d10 fluids but is improving, weaning IVF to 50cc -Insulin and cpeptide levels are low, proinsulin pending  Anasarca -Gross edema throughout -Suspect related to third spacing from malnutrition with albumin <1.5 -Improving with BID IV lasix with good diuresis   DVT prophylaxis: SCD's Code Status: DNR Family Communication: Pt in room, family not at bedside  Status is: Inpatient  Remains inpatient appropriate because: severity of illness   Consultants:  PCCM Urology Psychiatrty  Procedures:    Antimicrobials: Anti-infectives (From admission, onward)    Start     Dose/Rate Route Frequency Ordered Stop   10/14/21 1000  valACYclovir (VALTREX) tablet 1,000 mg        1,000 mg Oral Daily 10/13/21 1152     10/13/21 2000  tetracycline (SUMYCIN) capsule 500 mg        500 mg Oral 2 times daily 10/13/21 1152     10/12/21 0930  tetracycline (SUMYCIN) capsule 500 mg  Status:  Discontinued        500 mg Per Tube 2 times daily 10/12/21 0918 10/13/21 1152   10/10/21 1000  valACYclovir (VALTREX) tablet 1,000 mg  Status:  Discontinued        1,000 mg Per Tube Daily 10/09/21 1344 10/13/21 1152   10/09/21 2000  cefTRIAXone (ROCEPHIN) 2 g in sodium chloride 0.9 % 100 mL IVPB        2 g 200 mL/hr over 30 Minutes Intravenous Every 24 hours 10/09/21 1705 10/15/21 2143   10/07/21 1800  ceFEPIme (MAXIPIME) 2 g in sodium chloride 0.9 % 100 mL IVPB  Status:  Discontinued        2 g 200 mL/hr over 30 Minutes Intravenous Every 8 hours 10/07/21 0954 10/09/21  1705   10/07/21 1230  bictegravir-emtricitabine-tenofovir AF (BIKTARVY) 50-200-25 MG per tablet 1 tablet        1 tablet Oral Daily 10/07/21 1138     10/07/21 1200  metroNIDAZOLE (FLAGYL) IVPB 500 mg  Status:  Discontinued        500 mg 100 mL/hr over 60 Minutes Intravenous Every 12 hours 10/07/21 0951 10/08/21 1601   10/07/21 1200  vancomycin (VANCOREADY) IVPB 1750 mg/350 mL  Status:  Discontinued        1,750 mg 175 mL/hr over 120 Minutes Intravenous Every 12 hours 10/07/21 0958 10/09/21 1705   10/07/21 1100  ceFEPIme (MAXIPIME) 2 g in sodium chloride 0.9 % 100 mL IVPB        2 g 200 mL/hr over 30 Minutes Intravenous  Once 10/07/21 0952 10/07/21 1236   10/07/21 1000  valACYclovir (VALTREX) tablet 1,000 mg  Status:  Discontinued        1,000 mg Oral Daily 10/07/21 0232 10/09/21 1344   10/06/21 2000  piperacillin-tazobactam (ZOSYN) IVPB 3.375 g        3.375 g 100 mL/hr over 30 Minutes Intravenous  Once 10/06/21 1954 10/06/21 2238   10/06/21 2000  vancomycin (VANCOREADY) IVPB 2000 mg/400 mL        2,000 mg 200  mL/hr over 120 Minutes Intravenous  Once 10/06/21 1955 10/06/21 2350       Subjective: Initially nonverbal, tracking with eyes. Over time, starts yelling religious themed statements  Objective: Vitals:   10/23/21 1446 10/23/21 2011 10/24/21 0413 10/24/21 1400  BP: 115/75 106/68 108/66 107/70  Pulse: 84 100 86 89  Resp: 17 18 16    Temp: 98 F (36.7 C) (!) 97.3 F (36.3 C) 98.5 F (36.9 C) 98.8 F (37.1 C)  TempSrc: Oral Oral Oral Oral  SpO2: 100% 99% 92% 94%  Weight:      Height:        Intake/Output Summary (Last 24 hours) at 10/24/2021 1626 Last data filed at 10/24/2021 1500 Gross per 24 hour  Intake 0 ml  Output 5450 ml  Net -5450 ml    Filed Weights   10/19/21 0500 10/20/21 0500 10/23/21 0700  Weight: 113.5 kg 110.4 kg 79.8 kg    Examination: General exam: Awake, laying in bed, in nad Respiratory system: Normal respiratory effort, no  wheezing Cardiovascular system: regular rate, s1, s2 Gastrointestinal system: Soft, nondistended, positive BS Central nervous system: CN2-12 grossly intact, strength intact Extremities: Perfused, no clubbing Skin: Normal skin turgor, no notable skin lesions seen Psychiatry: Unable to assess given mentation  Data Reviewed: I have personally reviewed following labs and imaging studies  CBC: Recent Labs  Lab 10/18/21 0500 10/20/21 0640 10/21/21 0500 10/23/21 0949 10/24/21 0605  WBC 11.9* 13.0* 13.1* 11.3* 10.5  HGB 9.4* 8.8* 8.2* 8.0* 7.5*  HCT 30.9* 29.2* 26.8* 25.5* 24.0*  MCV 101.3* 102.1* 100.0 98.1 97.6  PLT 159 143* 153 181 762    Basic Metabolic Panel: Recent Labs  Lab 10/18/21 0500 10/20/21 0640 10/21/21 0500 10/23/21 0949 10/24/21 0605  NA  --  136 136 135 137  K  --  4.1 4.3 3.4* 3.2*  CL  --  105 103 96* 94*  CO2  --  26 27 31  34*  GLUCOSE  --  78 87 84 91  BUN  --  7 7 9 7   CREATININE  --  0.76 0.85 0.92 0.83  CALCIUM  --  8.1* 8.2* 8.1* 8.1*  MG 1.6* 1.5* 2.6*  --   --   PHOS  --  3.8  --   --   --     GFR: Estimated Creatinine Clearance: 137.5 mL/min (by C-G formula based on SCr of 0.83 mg/dL). Liver Function Tests: Recent Labs  Lab 10/21/21 0500 10/23/21 0949 10/24/21 0605  AST 21 24 22   ALT 21 25 22   ALKPHOS 179* 211* 188*  BILITOT 0.7 0.6 0.8  PROT 5.9* 6.3* 6.0*  ALBUMIN <1.5* <1.5* <1.5*    No results for input(s): LIPASE, AMYLASE in the last 168 hours. Recent Labs  Lab 10/24/21 0605  AMMONIA 30   Coagulation Profile: No results for input(s): INR, PROTIME in the last 168 hours.  Cardiac Enzymes: No results for input(s): CKTOTAL, CKMB, CKMBINDEX, TROPONINI in the last 168 hours. BNP (last 3 results) No results for input(s): PROBNP in the last 8760 hours. HbA1C: No results for input(s): HGBA1C in the last 72 hours. CBG: Recent Labs  Lab 10/23/21 2013 10/23/21 2358 10/24/21 0404 10/24/21 1011 10/24/21 1358  GLUCAP 90 92  93 88 76    Lipid Profile: No results for input(s): CHOL, HDL, LDLCALC, TRIG, CHOLHDL, LDLDIRECT in the last 72 hours. Thyroid Function Tests: No results for input(s): TSH, T4TOTAL, FREET4, T3FREE, THYROIDAB in the last 72 hours. Anemia Panel:  No results for input(s): VITAMINB12, FOLATE, FERRITIN, TIBC, IRON, RETICCTPCT in the last 72 hours. Sepsis Labs: No results for input(s): PROCALCITON, LATICACIDVEN in the last 168 hours.  No results found for this or any previous visit (from the past 240 hour(s)).   Radiology Studies: No results found.  Scheduled Meds:  (feeding supplement) PROSource Plus  30 mL Oral QID   vitamin C  500 mg Oral BID   bictegravir-emtricitabine-tenofovir AF  1 tablet Oral Daily   chlorhexidine  15 mL Mouth Rinse BID   Chlorhexidine Gluconate Cloth  6 each Topical Q0600   escitalopram  20 mg Oral Daily   famotidine  20 mg Oral BID   feeding supplement  237 mL Oral BID BM   folic acid  1 mg Oral Daily   furosemide  40 mg Intravenous BID   Gerhardt's butt cream   Topical BID   lactulose  10 g Oral TID   mouth rinse  15 mL Mouth Rinse q12n4p   midodrine  5 mg Oral TID WC   multivitamin with minerals  1 tablet Oral Daily   mupirocin ointment   Nasal BID   nicotine  14 mg Transdermal Daily   OLANZapine zydis  5 mg Oral QHS   pantoprazole  40 mg Oral BID   potassium chloride  40 mEq Oral Daily   sodium chloride flush  10-40 mL Intracatheter Q12H   tetracycline  500 mg Oral BID   thiamine  250 mg Oral Daily   valACYclovir  1,000 mg Oral Daily   zinc sulfate  220 mg Oral Daily   Continuous Infusions:  sodium chloride Stopped (10/14/21 0548)   sodium chloride Stopped (10/21/21 0225)   dextrose 50 mL/hr at 10/24/21 1052     LOS: 17 days   Marylu Lund, MD [facility] Pager On Amion  If 7PM-7AM, please contact night-coverage 10/24/2021, 4:26 PM

## 2021-10-24 NOTE — Progress Notes (Signed)
Pt is declining all oral meds and care today. MD made aware.

## 2021-10-25 DIAGNOSIS — M87052 Idiopathic aseptic necrosis of left femur: Secondary | ICD-10-CM | POA: Diagnosis not present

## 2021-10-25 DIAGNOSIS — A419 Sepsis, unspecified organism: Secondary | ICD-10-CM | POA: Diagnosis not present

## 2021-10-25 DIAGNOSIS — L03317 Cellulitis of buttock: Secondary | ICD-10-CM | POA: Diagnosis not present

## 2021-10-25 DIAGNOSIS — R6521 Severe sepsis with septic shock: Secondary | ICD-10-CM | POA: Diagnosis not present

## 2021-10-25 LAB — COMPREHENSIVE METABOLIC PANEL
ALT: 24 U/L (ref 0–44)
AST: 26 U/L (ref 15–41)
Albumin: <1.5 g/dL — ABNORMAL LOW (ref 3.5–5.0)
Alkaline Phosphatase: 207 U/L — ABNORMAL HIGH (ref 38–126)
Anion gap: 6 (ref 5–15)
BUN: 6 mg/dL (ref 6–20)
CO2: 33 mmol/L — ABNORMAL HIGH (ref 22–32)
Calcium: 8.1 mg/dL — ABNORMAL LOW (ref 8.9–10.3)
Chloride: 96 mmol/L — ABNORMAL LOW (ref 98–111)
Creatinine, Ser: 0.82 mg/dL (ref 0.61–1.24)
GFR, Estimated: >60 mL/min (ref >60)
Glucose, Bld: 104 mg/dL — ABNORMAL HIGH (ref 70–99)
Potassium: 3.1 mmol/L — ABNORMAL LOW (ref 3.5–5.1)
Sodium: 135 mmol/L (ref 135–145)
Total Bilirubin: 0.7 mg/dL (ref 0.3–1.2)
Total Protein: 7 g/dL (ref 6.5–8.1)

## 2021-10-25 LAB — GLUCOSE, CAPILLARY
Glucose-Capillary: 82 mg/dL (ref 70–99)
Glucose-Capillary: 86 mg/dL (ref 70–99)
Glucose-Capillary: 94 mg/dL (ref 70–99)
Glucose-Capillary: 88 mg/dL (ref 70–99)
Glucose-Capillary: 66 mg/dL — ABNORMAL LOW (ref 70–99)
Glucose-Capillary: 119 mg/dL — ABNORMAL HIGH (ref 70–99)

## 2021-10-25 LAB — CBC
HCT: 27.2 % — ABNORMAL LOW (ref 39.0–52.0)
Hemoglobin: 8.6 g/dL — ABNORMAL LOW (ref 13.0–17.0)
MCH: 30.8 pg (ref 26.0–34.0)
MCHC: 31.6 g/dL (ref 30.0–36.0)
MCV: 97.5 fL (ref 80.0–100.0)
Platelets: 210 10*3/uL (ref 150–400)
RBC: 2.79 MIL/uL — ABNORMAL LOW (ref 4.22–5.81)
RDW: 21.5 % — ABNORMAL HIGH (ref 11.5–15.5)
WBC: 12.5 10*3/uL — ABNORMAL HIGH (ref 4.0–10.5)
nRBC: 0 % (ref 0.0–0.2)

## 2021-10-25 MED ORDER — OLANZAPINE 5 MG PO TABS
5.0000 mg | ORAL_TABLET | Freq: Once | ORAL | Status: AC
Start: 2021-10-25 — End: 2021-10-29
  Administered 2021-10-29: 5 mg via ORAL
  Filled 2021-10-25 (×2): qty 1

## 2021-10-25 MED ORDER — POTASSIUM CHLORIDE 10 MEQ/100ML IV SOLN
10.0000 meq | INTRAVENOUS | Status: AC
  Administered 2021-10-25 (×6): 10 meq via INTRAVENOUS
  Filled 2021-10-25: qty 100

## 2021-10-25 NOTE — Progress Notes (Signed)
PROGRESS NOTE    [name]  AJO:[id number] DOB: [birth year] DOA: 10/06/2021 PCP: [name], MD    Brief Narrative:  36 years old African-American male with PMH significant for tobacco abuse, HIV+ , bipolar disorder, hypertension, migraine, peripheral neuropathy, PTSD, schizoaffective disorder, seizure disorder presented in the ED with complaints of sores on his buttocks.  Patient had a visit from home health services who raised the concern that his sores have become infected.  Patient was noted to be hypothermic on arrival.  Labs in the ER showed lactic acid 6.4, potassium 2.7, abnormal LFTs.  Patient was hypotensive requiring pressor support.  Patient was agitated and restless requiring Precedex.  Subsequently patient was intubated and admitted in the ICU.  He is now off pressors, off Precedex drip. Septic shock resolved.  Patient continued to remain confused and has no capacity to make decisions. Palliative care consulted to discuss goals of care.  Mother is understanding that he has poor prognosis.  She is receptive to consider the options of hospice. PCCM pickup 10/15/2021.  Patient is currently on room air, he is on comfort feeds,  he has a Foley catheter, remains afebrile, He is very deconditioned  Assessment & Plan:   Active Problems:   Schizoaffective disorder, depressive type (HCC)   Hypokalemia   Elevated LFTs   Acute encephalopathy   Macrocytic anemia   Thrombocytopenia (HCC)   Prolonged QT interval   HIV positive (HCC)   Avascular necrosis of femoral head, left (HCC)   Hepatic steatosis   Cellulitis of multiple sites of buttock   Protein-calorie malnutrition, severe (HCC)   DNR (do not resuscitate)   Aspiration pneumonia ([place])   FTT (failure to thrive) in adult   Dysphagia   Coagulopathy ([place])   Palliative care by specialist   Malnutrition of moderate degree  Septic shock sec. to E. coli UTI /  LE ulcerations: Patient completed course of antibiotics on  10/15/2021. Sepsis physiology resolved, Pt was to continue midodrine, however pt has been refusing medications.  BP remains well controlled, thus will formally d/c midodrine Foley has since been removed, cont to void well   Acute on chronic metabolic encephalopathy: Complicated by bipolar disorder, schizoaffective, EtOH abuse, opioid dependence and PTSD, Avoid antipsychotics such as Haldol or Seroquel given QTC issues. Continue Lexapro, Xanax, Continue lactulose, however pt has been refusing meds lately Palliative care started Zyprexa ODT as needed. Psychiatry consulted, recommended to discontinue Latuda, reduce Xanax and continue Zyprexa ODT and Lexapro although pt is now refusing medicaitons Recently re-evaluated by Psych, noted to be able to contract for safety at that time Over the weekend, pt noted to be largely non-verbal, intermittently yelling religious statements, refusing medical treatment Have asked Psychiatry to re-evaluate   Chronic hypotension : Completed stress dose steroids. Pt had been refusing midodrine lately, BP remains stable and controlled Have d/c'd midodrine   Epistaxis: > resolved Suspect bleeding from repeated NG tube insertions in context of thrombocytopenia and coagulopathy. Continue aspiration precautions.   Acute on chronic dysphagia: Patient failed dysphagia 3 diet, Speech recommended regular diet with thick liquids.   EtOH cirrhosis: Chronic elevated ammonia: Has been refusing lactulose doses Cont with rectal tube with stool output.  Goal 2-3 BM daily Ammonia 30   HIV: Continue Biktarvy.   Mild coagulopathy: Suspect due to chronic liver disease.   Chronic anemia / thrombocytopenia: Suspected secondary to hx of EtOH abuse. S/p 2 units of platelets and 3 unit PRBC. Hemoglobin has remained stable, Continue to  hold anticoagulation given hemoglobin drop this visit Continue SCDs as tolerated   Severe deconditioning. Failure to thrive and protein  calorie malnutrition: Patient was seen by palliative care. Maximize nutrition, needs long-term placement. Cont diet as pt tolerates   Open wound right upper arm:  Multiple scarring from prior ulceration. Continue Xeroform gauze dressing,   Continue tetracycline as tolerated   Hypomagnesemia: Replaced  Hypoglycemia -still dependent on dextrose containing fluids -Cont to encourage PO as tolerated, liberalize diet as tolerated -Insulin and cpeptide levels are low, proinsulin pending -Pt more catatonic and not eating much, thus will cont D10  Anasarca -Gross edema throughout -Suspect related to third spacing from malnutrition with albumin <1.5 -Improving with BID IV lasix with good diuresis -Recheck BMET in AM   DVT prophylaxis: SCD's Code Status: DNR Family Communication: Pt in room, family not at bedside  Status is: Inpatient  Remains inpatient appropriate because: severity of illness   Consultants:  PCCM Urology Psychiatrty Palliative Care  Procedures:    Antimicrobials: Anti-infectives (From admission, onward)    Start     Dose/Rate Route Frequency Ordered Stop   10/14/21 1000  valACYclovir (VALTREX) tablet 1,000 mg        1,000 mg Oral Daily 10/13/21 1152     10/13/21 2000  tetracycline (SUMYCIN) capsule 500 mg        500 mg Oral 2 times daily 10/13/21 1152     10/12/21 0930  tetracycline (SUMYCIN) capsule 500 mg  Status:  Discontinued        500 mg Per Tube 2 times daily 10/12/21 0918 10/13/21 1152   10/10/21 1000  valACYclovir (VALTREX) tablet 1,000 mg  Status:  Discontinued        1,000 mg Per Tube Daily 10/09/21 1344 10/13/21 1152   10/09/21 2000  cefTRIAXone (ROCEPHIN) 2 g in sodium chloride 0.9 % 100 mL IVPB        2 g 200 mL/hr over 30 Minutes Intravenous Every 24 hours 10/09/21 1705 10/15/21 2143   10/07/21 1800  ceFEPIme (MAXIPIME) 2 g in sodium chloride 0.9 % 100 mL IVPB  Status:  Discontinued        2 g 200 mL/hr over 30 Minutes Intravenous Every  8 hours 10/07/21 0954 10/09/21 1705   10/07/21 1230  bictegravir-emtricitabine-tenofovir AF (BIKTARVY) 50-200-25 MG per tablet 1 tablet        1 tablet Oral Daily 10/07/21 1138     10/07/21 1200  metroNIDAZOLE (FLAGYL) IVPB 500 mg  Status:  Discontinued        500 mg 100 mL/hr over 60 Minutes Intravenous Every 12 hours 10/07/21 0951 10/08/21 1601   10/07/21 1200  vancomycin (VANCOREADY) IVPB 1750 mg/350 mL  Status:  Discontinued        1,750 mg 175 mL/hr over 120 Minutes Intravenous Every 12 hours 10/07/21 0958 10/09/21 1705   10/07/21 1100  ceFEPIme (MAXIPIME) 2 g in sodium chloride 0.9 % 100 mL IVPB        2 g 200 mL/hr over 30 Minutes Intravenous  Once 10/07/21 0952 10/07/21 1236   10/07/21 1000  valACYclovir (VALTREX) tablet 1,000 mg  Status:  Discontinued        1,000 mg Oral Daily 10/07/21 0232 10/09/21 1344   10/06/21 2000  piperacillin-tazobactam (ZOSYN) IVPB 3.375 g        3.375 g 100 mL/hr over 30 Minutes Intravenous  Once 10/06/21 1954 10/06/21 2238   10/06/21 2000  vancomycin (VANCOREADY) IVPB 2000 mg/400 mL  2,000 mg 200 mL/hr over 120 Minutes Intravenous  Once 10/06/21 1955 10/06/21 2350       Subjective: Remains non-verbal. Responds appropriately with nodding. Tracks with eyes  Objective: Vitals:   10/24/21 1400 10/24/21 1945 10/25/21 0554 10/25/21 1612  BP: 107/70 114/75 111/74 114/74  Pulse: 89 94 87 (!) 102  Resp:  16 16 20   Temp: 98.8 F (37.1 C) 99.1 F (37.3 C) 98.2 F (36.8 C) 98.4 F (36.9 C)  TempSrc: Oral Oral Oral Oral  SpO2: 94% 95% 97% 97%  Weight:   86.9 kg   Height:        Intake/Output Summary (Last 24 hours) at 10/25/2021 1654 Last data filed at 10/25/2021 1130 Gross per 24 hour  Intake 740 ml  Output 4450 ml  Net -3710 ml    Filed Weights   10/20/21 0500 10/23/21 0700 10/25/21 0554  Weight: 110.4 kg 79.8 kg 86.9 kg    Examination: General exam: Not conversant, in no acute distress Respiratory system: normal chest rise,  clear, no audible wheezing Cardiovascular system: regular rhythm, s1-s2 Gastrointestinal system: Nondistended, nontender, pos BS Central nervous system: No seizures, no tremors Extremities: No cyanosis, no joint deformities Skin: No rashes, no pallor Psychiatry: Unable to assess given mentation/lack of verbal communication  Data Reviewed: I have personally reviewed following labs and imaging studies  CBC: Recent Labs  Lab 10/20/21 0640 10/21/21 0500 10/23/21 0949 10/24/21 0605 10/25/21 0630  WBC 13.0* 13.1* 11.3* 10.5 12.5*  HGB 8.8* 8.2* 8.0* 7.5* 8.6*  HCT 29.2* 26.8* 25.5* 24.0* 27.2*  MCV 102.1* 100.0 98.1 97.6 97.5  PLT 143* 153 181 189 557    Basic Metabolic Panel: Recent Labs  Lab 10/20/21 0640 10/21/21 0500 10/23/21 0949 10/24/21 0605 10/25/21 0630  NA 136 136 135 137 135  K 4.1 4.3 3.4* 3.2* 3.1*  CL 105 103 96* 94* 96*  CO2 26 27 31  34* 33*  GLUCOSE 78 87 84 91 104*  BUN 7 7 9 7 6   CREATININE 0.76 0.85 0.92 0.83 0.82  CALCIUM 8.1* 8.2* 8.1* 8.1* 8.1*  MG 1.5* 2.6*  --   --   --   PHOS 3.8  --   --   --   --     GFR: Estimated Creatinine Clearance: 147.4 mL/min (by C-G formula based on SCr of 0.82 mg/dL). Liver Function Tests: Recent Labs  Lab 10/21/21 0500 10/23/21 0949 10/24/21 0605 10/25/21 0630  AST 21 24 22 26   ALT 21 25 22 24   ALKPHOS 179* 211* 188* 207*  BILITOT 0.7 0.6 0.8 0.7  PROT 5.9* 6.3* 6.0* 7.0  ALBUMIN <1.5* <1.5* <1.5* <1.5*    No results for input(s): LIPASE, AMYLASE in the last 168 hours. Recent Labs  Lab 10/24/21 0605  AMMONIA 30    Coagulation Profile: No results for input(s): INR, PROTIME in the last 168 hours.  Cardiac Enzymes: No results for input(s): CKTOTAL, CKMB, CKMBINDEX, TROPONINI in the last 168 hours. BNP (last 3 results) No results for input(s): PROBNP in the last 8760 hours. HbA1C: No results for input(s): HGBA1C in the last 72 hours. CBG: Recent Labs  Lab 10/24/21 1949 10/24/21 2349  10/25/21 0550 10/25/21 0748 10/25/21 1214  GLUCAP 121* 106* 82 86 94    Lipid Profile: No results for input(s): CHOL, HDL, LDLCALC, TRIG, CHOLHDL, LDLDIRECT in the last 72 hours. Thyroid Function Tests: No results for input(s): TSH, T4TOTAL, FREET4, T3FREE, THYROIDAB in the last 72 hours. Anemia Panel: No results for  input(s): VITAMINB12, FOLATE, FERRITIN, TIBC, IRON, RETICCTPCT in the last 72 hours. Sepsis Labs: No results for input(s): PROCALCITON, LATICACIDVEN in the last 168 hours.  No results found for this or any previous visit (from the past 240 hour(s)).   Radiology Studies: No results found.  Scheduled Meds:  (feeding supplement) PROSource Plus  30 mL Oral QID   vitamin C  500 mg Oral BID   bictegravir-emtricitabine-tenofovir AF  1 tablet Oral Daily   chlorhexidine  15 mL Mouth Rinse BID   Chlorhexidine Gluconate Cloth  6 each Topical Q0600   escitalopram  20 mg Oral Daily   famotidine  20 mg Oral BID   feeding supplement  237 mL Oral BID BM   folic acid  1 mg Oral Daily   furosemide  40 mg Intravenous BID   Gerhardt's butt cream   Topical BID   lactulose  10 g Oral TID   mouth rinse  15 mL Mouth Rinse q12n4p   midodrine  5 mg Oral TID WC   multivitamin with minerals  1 tablet Oral Daily   mupirocin ointment   Nasal BID   nicotine  14 mg Transdermal Daily   OLANZapine  5 mg Oral Once   OLANZapine zydis  5 mg Oral QHS   pantoprazole  40 mg Oral BID   sodium chloride flush  10-40 mL Intracatheter Q12H   tetracycline  500 mg Oral BID   thiamine  250 mg Oral Daily   valACYclovir  1,000 mg Oral Daily   zinc sulfate  220 mg Oral Daily   Continuous Infusions:  sodium chloride Stopped (10/14/21 0548)   sodium chloride Stopped (10/21/21 0225)   dextrose 50 mL/hr at 10/24/21 1052   potassium chloride 10 mEq (10/25/21 1634)     LOS: 18 days   Marylu Lund, MD [facility] Pager On Amion  If 7PM-7AM, please contact night-coverage 10/25/2021, 4:54 PM

## 2021-10-25 NOTE — Progress Notes (Signed)
Daily Progress Note   Patient Name: [name]       Date: 10/25/2021 DOB: 1985/12/09  Age: 36 y.o. MRN#: [id number] Attending Physician: Donne Hazel, MD Primary Care Physician: [name], MD Admit Date: 10/06/2021  Reason for Consultation/Follow-up: Establishing goals of care  Subjective: I saw and examined Farley.  He was lying in bed in no distress.  Seemed anxious on my arrival.  Did not speak but he did track me in the room and nodded appropriately to some questions.  Length of Stay: 18  Current Medications: Scheduled Meds:   (feeding supplement) PROSource Plus  30 mL Oral QID   vitamin C  500 mg Oral BID   bictegravir-emtricitabine-tenofovir AF  1 tablet Oral Daily   chlorhexidine  15 mL Mouth Rinse BID   Chlorhexidine Gluconate Cloth  6 each Topical Q0600   escitalopram  20 mg Oral Daily   famotidine  20 mg Oral BID   feeding supplement  237 mL Oral BID BM   folic acid  1 mg Oral Daily   furosemide  40 mg Intravenous BID   Gerhardt's butt cream   Topical BID   lactulose  10 g Oral TID   mouth rinse  15 mL Mouth Rinse q12n4p   midodrine  5 mg Oral TID WC   multivitamin with minerals  1 tablet Oral Daily   mupirocin ointment   Nasal BID   nicotine  14 mg Transdermal Daily   OLANZapine  5 mg Oral Once   OLANZapine zydis  5 mg Oral QHS   pantoprazole  40 mg Oral BID   potassium chloride  40 mEq Oral Daily   sodium chloride flush  10-40 mL Intracatheter Q12H   tetracycline  500 mg Oral BID   thiamine  250 mg Oral Daily   valACYclovir  1,000 mg Oral Daily   zinc sulfate  220 mg Oral Daily    Continuous Infusions:  sodium chloride Stopped (10/14/21 0548)   sodium chloride Stopped (10/21/21 0225)   dextrose 50 mL/hr at 10/24/21 1052    PRN  Meds: albuterol, ALPRAZolam, docusate sodium, food thickener, lip balm, polyethylene glycol, sodium chloride flush  Physical Exam         General: awake alert no distress Has regular work of breathing Appears chronically ill Not  agitated Monitor noted   doesn't interact   Vital Signs: BP 111/74 (BP Location: Left Arm)    Pulse 87    Temp 98.2 F (36.8 C) (Oral)    Resp 16    Ht 6' 3"  (1.905 m)    Wt 86.9 kg    SpO2 97%    BMI 23.95 kg/m  SpO2: SpO2: 97 % O2 Device: O2 Device: Room Air O2 Flow Rate: O2 Flow Rate (L/min): 2 L/min  Intake/output summary:  Intake/Output Summary (Last 24 hours) at 10/25/2021 0957 Last data filed at 10/25/2021 9937 Gross per 24 hour  Intake 500 ml  Output 4450 ml  Net -3950 ml    LBM: Last BM Date: 10/22/21 Baseline Weight: Weight: 108.9 kg Most recent weight: Weight: 86.9 kg       Palliative Assessment/Data:    Flowsheet Rows    Flowsheet Row Most Recent Value  Intake Tab   Referral Department Critical care  Unit at Time of Referral ICU  Palliative Care Primary Diagnosis Sepsis/Infectious Disease  Date Notified 10/09/21  Palliative Care Type New Palliative care  Reason for referral Clarify Goals of Care  Date of Admission 10/06/21  Date first seen by Palliative Care 10/10/21  # of days Palliative referral response time 1 Day(s)  # of days IP prior to Palliative referral 3  Clinical Assessment   Palliative Performance Scale Score 30%  Psychosocial & Spiritual Assessment   Palliative Care Outcomes   Patient/Family meeting held? Yes  Who was at the meeting? Patient, mother via phone       Patient Active Problem List   Diagnosis Date Noted   Malnutrition of moderate degree 10/20/2021   Palliative care by specialist    DNR (do not resuscitate) 10/14/2021   Aspiration pneumonia ([place]) 10/14/2021   FTT (failure to thrive) in adult 10/14/2021   Dysphagia 10/14/2021   Coagulopathy ([place]) 10/14/2021   Protein-calorie malnutrition,  severe ([place]) 10/09/2021   HIV positive ([place]) 10/07/2021   Avascular necrosis of femoral head, left ([place]) 10/07/2021   Hepatic steatosis 10/07/2021   Cellulitis of multiple sites of buttock 10/07/2021   Hypotension    Hypoxia 08/24/2021   Left hip pain    Transaminitis    Acute metabolic encephalopathy 16/96/7893   Cellulitis 05/07/2021   Lymphadenopathy 09/29/2020   Anemia    Upper urinary tract infection    Sepsis secondary to UTI ([place]) 03/14/2020   Cellulitis of groin 03/14/2020   Elevated LFTs 03/14/2020   HIV disease ([place])    Acute encephalopathy    Macrocytic anemia    Thrombocytopenia (HCC)    Prolonged QT interval    Acute respiratory failure due to COVID-19 ([place]) 10/27/2019   GERD (gastroesophageal reflux disease) 10/27/2019   Hypokalemia 10/27/2019   Peripheral neuropathy 10/01/2019   PTSD (post-traumatic stress disorder) 07/23/2018   Dizziness and giddiness 02/01/2016   Migraine headache 02/01/2016   Schizoaffective disorder, depressive type ([place]) 05/13/2015   Severe alcohol dependence ([place]) 05/13/2015   Suicidal ideation 01/12/2014    Palliative Care Assessment & Plan   Patient Profile: 36 y.o. male  with past medical history of HIV, bipolar, depression, hypertension, migraine, schizoaffective disorder, seizures admitted on 10/06/2021 with hypothermia and concern for infected wounds.  At time of admission, he was started on Zosyn and vancomycin.  Labs revealed hypokalemia and elevated lactate.  Fluid resuscitation started.  He is remained very weak and continues to have significant dysphagia.  NG tube was placed to dysphagia.  Palliative  consulted for goals of care.  Recommendations/Plan: DNR DNI Monitor PO intake and hospital course I called and spoke with his mother today to update her.  We discussed his continued agitation.  She remains concerned about safe discharge plan for [place] and is hopeful to find SNF placement with palliative to follow.   Goals of  Care and Additional Recommendations: Limitations on Scope of Treatment: Full Scope Treatment   Prognosis:  Unable to determine  Discharge Planning: To Be Determined  Care plan was discussed with IDT   Thank you for allowing the Palliative Medicine Team to assist in the care of this patient.  Micheline Rough, MD  Please contact Palliative Medicine Team phone at [phone] for questions and concerns.

## 2021-10-25 NOTE — Progress Notes (Signed)
Apx 0330 Pt agitated, continually trying to get out of bed, has Xanax 1.5 mg PO was given at [facility] refusing to take at this time, [place], NP notified with new order for Zyprexa 5 mg PO x1 dose, Pt refusing to take medication. Pt continually trying to get out of bed to leave becoming verbally and  physically combative unable to redirect , Np notified obtained order for restraints at [facility], [place] placed in restraints and mother [place] notified at [phone]

## 2021-10-25 NOTE — Consult Note (Signed)
[facility] Consult   Reason for Consult: "Refusing all treatment and very labile affect" Referring Physician:  Marylu Lund  Patient Identification: [name] MRN:  [id number] Principal Diagnosis: Septic shock ([place]) Diagnosis:  Active Problems:   Schizoaffective disorder, depressive type ([place])   Hypokalemia   Elevated LFTs   Acute encephalopathy   Macrocytic anemia   Thrombocytopenia (HCC)   Prolonged QT interval   HIV positive ([place])   Avascular necrosis of femoral head, left (HCC)   Hepatic steatosis   Cellulitis of multiple sites of buttock   Protein-calorie malnutrition, severe ([place])   DNR (do not resuscitate)   Aspiration pneumonia ([place])   FTT (failure to thrive) in adult   Dysphagia   Coagulopathy ([place])   Palliative care by specialist   Malnutrition of moderate degree   Total Time spent with patient: 15 minutes  Subjective:   [name] is a 36 y.o. male patient was seen and evaluated face-to-face.  Patient is currently physically restrained.  Patient did make eye contact and did responded by nodding his head. ( Left and right) Charted history with schizoaffective disorder, bipolar disorder, posttraumatic stress disorder and suicidal ideations.  Chart reviewed patient has agitation orders placed for Zyprexa 5 mg p.o. x1 dose however it was reported that patient has been refusing medication and physically combative.  Patient is currently in restraints.   Case staffed with attending psychiatrist MD Dwyane Dee. Consider initiating Zyprexa Zydis (dissolvable) 5 mg p.o. twice daily may offer by mouth or Zyprexa IM if patient continues to refuse,  labs reviwed. UDS (was not collected) potassium 3.1.  Continue to monitor EKG for QT prolongation.  HPI: Per Narrative" 36 years old African-American male with PMH significant for tobacco abuse, HIV+ , bipolar disorder, hypertension, migraine, peripheral neuropathy, PTSD, schizoaffective disorder, seizure  disorder presented in the ED with complaints of sores on his buttocks.  Patient had a visit from home health services who raised the concern that his sores have become infected.  Patient was noted to be hypothermic on arrival.  Labs in the ER showed lactic acid 6.4, potassium 2.7, abnormal LFTs.  Patient was hypotensive requiring pressor support.  Patient was agitated and restless requiring Precedex.  Subsequently patient was intubated and admitted in the ICU.  He is now off pressors, off Precedex drip. Septic shock resolved.  Patient continued to remain confused and has no capacity to make decisions."   Past Psychiatric History:   Risk to Self:   Risk to Others:   Prior Inpatient Therapy:   Prior Outpatient Therapy:    Past Medical History:  Past Medical History:  Diagnosis Date   Bipolar 1 disorder ([place])    Depression    Dizziness and giddiness 02/01/2016   Herpes genitalia    HIV disease ([place])    Hypertension    Hyponatremia    Hypothermia 08/24/2021   Migraine headache 02/01/2016   Peripheral neuropathy 10/01/2019   PTSD (post-traumatic stress disorder)    Schizoaffective disorder ([place])    Seizures ([place])     Past Surgical History:  Procedure Laterality Date   BACK SURGERY     BIOPSY  02/26/2021   Procedure: BIOPSY;  Surgeon: Otis Brace, MD;  Location: [facility];  Service: Gastroenterology;;   COLONOSCOPY WITH PROPOFOL [name] 02/26/2021   Procedure: COLONOSCOPY WITH PROPOFOL;  Surgeon: Otis Brace, MD;  Location: [facility];  Service: Gastroenterology;  Laterality: [name];   ESOPHAGOGASTRODUODENOSCOPY (EGD) WITH PROPOFOL [name] 02/26/2021   Procedure: ESOPHAGOGASTRODUODENOSCOPY (EGD)  WITH PROPOFOL;  Surgeon: Otis Brace, MD;  Location: [facility];  Service: Gastroenterology;  Laterality: [name];   ESOPHAGOGASTRODUODENOSCOPY (EGD) WITH PROPOFOL [name] 05/18/2021   Procedure: ESOPHAGOGASTRODUODENOSCOPY (EGD) WITH PROPOFOL;  Surgeon: Otis Brace, MD;  Location: [facility];  Service: Gastroenterology;  Laterality: [name];   HAND SURGERY     Family History:  Family History  Problem Relation Age of Onset   Alcohol abuse Mother    Schizophrenia Father    Depression Father    Alcohol abuse Father    Alcohol abuse Paternal Uncle    Alcohol abuse Paternal Uncle    Family Psychiatric  History:  Social History:  Social History   Substance and Sexual Activity  Alcohol Use Yes   Alcohol/week: 14.0 standard drinks   Types: 14 Cans of beer per week   Comment: 5-6 40oz per day     Social History   Substance and Sexual Activity  Drug Use No    Social History   Socioeconomic History   Marital status: Single    Spouse name: Not on file   Number of children: Not on file   Years of education: Not on file   Highest education level: Not on file  Occupational History   Occupation: [employer]  Tobacco Use   Smoking status: Every Day    Packs/day: 1.00    Types: Cigarettes   Smokeless tobacco: Never  Vaping Use   Vaping Use: Never used  Substance and Sexual Activity   Alcohol use: Yes    Alcohol/week: 14.0 standard drinks    Types: 14 Cans of beer per week    Comment: 5-6 40oz per day   Drug use: No   Sexual activity: Not Currently    Birth control/protection: Condom  Other Topics Concern   Not on file  Social History Narrative   Lives at home alone   Right-handed   Drinks 2-3 sodas per day   Social Determinants of Health   Financial Resource Strain: Not on file  Food Insecurity: Not on file  Transportation Needs: Not on file  Physical Activity: Not on file  Stress: Not on file  Social Connections: Not on file   Additional Social History:    Allergies:   Allergies  Allergen Reactions   Dapsone Other (See Comments)    Per centricity "G6PD deficient"   Primaquine Phosphate Other (See Comments)    Per Centricity "G6PD deficient"    Labs:  Results for orders placed or performed during the hospital encounter of 10/06/21 (from  the past 48 hour(s))  Glucose, capillary     Status: None   Collection Time: 10/23/21  8:13 PM  Result Value Ref Range   Glucose-Capillary 90 70 - 99 mg/dL    Comment: Glucose reference range applies only to samples taken after fasting for at least 8 hours.  Glucose, capillary     Status: None   Collection Time: 10/23/21 11:58 PM  Result Value Ref Range   Glucose-Capillary 92 70 - 99 mg/dL    Comment: Glucose reference range applies only to samples taken after fasting for at least 8 hours.  Glucose, capillary     Status: None   Collection Time: 10/24/21  4:04 AM  Result Value Ref Range   Glucose-Capillary 93 70 - 99 mg/dL    Comment: Glucose reference range applies only to samples taken after fasting for at least 8 hours.  Comprehensive metabolic panel     Status: Abnormal   Collection Time: 10/24/21  6:05 AM  Result Value Ref Range   Sodium 137 135 - 145 mmol/L   Potassium 3.2 (L) 3.5 - 5.1 mmol/L   Chloride 94 (L) 98 - 111 mmol/L   CO2 34 (H) 22 - 32 mmol/L   Glucose, Bld 91 70 - 99 mg/dL    Comment: Glucose reference range applies only to samples taken after fasting for at least 8 hours.   BUN 7 6 - 20 mg/dL   Creatinine, Ser 0.83 0.61 - 1.24 mg/dL   Calcium 8.1 (L) 8.9 - 10.3 mg/dL   Total Protein 6.0 (L) 6.5 - 8.1 g/dL   Albumin <1.5 (L) 3.5 - 5.0 g/dL   AST 22 15 - 41 U/L   ALT 22 0 - 44 U/L   Alkaline Phosphatase 188 (H) 38 - 126 U/L   Total Bilirubin 0.8 0.3 - 1.2 mg/dL   GFR, Estimated >60 >60 mL/min    Comment: (NOTE) Calculated using the CKD-EPI Creatinine Equation (2021)    Anion gap 9 5 - 15    Comment: Performed at [facility], [place] [address], [place], [place] [address]  CBC     Status: Abnormal   Collection Time: 10/24/21  6:05 AM  Result Value Ref Range   WBC 10.5 4.0 - 10.5 K/uL   RBC 2.46 (L) 4.22 - 5.81 MIL/uL   Hemoglobin 7.5 (L) 13.0 - 17.0 g/dL   HCT 24.0 (L) 39.0 - 52.0 %   MCV 97.6 80.0 - 100.0 fL   MCH 30.5 26.0 - 34.0 pg    MCHC 31.3 30.0 - 36.0 g/dL   RDW 21.6 (H) 11.5 - 15.5 %   Platelets 189 150 - 400 K/uL   nRBC 0.0 0.0 - 0.2 %    Comment: Performed at [facility], [place] [address] [place], [place] [address]  Ammonia     Status: None   Collection Time: 10/24/21  6:05 AM  Result Value Ref Range   Ammonia 30 9 - 35 umol/L    Comment: Performed at [facility], [place] [address] [place],  [address]  Glucose, capillary     Status: None   Collection Time: 10/24/21 10:11 AM  Result Value Ref Range   Glucose-Capillary 88 70 - 99 mg/dL    Comment: Glucose reference range applies only to samples taken after fasting for at least 8 hours.  Glucose, capillary     Status: None   Collection Time: 10/24/21  1:58 PM  Result Value Ref Range   Glucose-Capillary 76 70 - 99 mg/dL    Comment: Glucose reference range applies only to samples taken after fasting for at least 8 hours.  Glucose, capillary     Status: None   Collection Time: 10/24/21  5:57 PM  Result Value Ref Range   Glucose-Capillary 98 70 - 99 mg/dL    Comment: Glucose reference range applies only to samples taken after fasting for at least 8 hours.  Glucose, capillary     Status: Abnormal   Collection Time: 10/24/21  7:49 PM  Result Value Ref Range   Glucose-Capillary 121 (H) 70 - 99 mg/dL    Comment: Glucose reference range applies only to samples taken after fasting for at least 8 hours.  Glucose, capillary     Status: Abnormal   Collection Time: 10/24/21 11:49 PM  Result Value Ref Range   Glucose-Capillary 106 (H) 70 - 99 mg/dL    Comment: Glucose reference range applies only to  samples taken after fasting for at least 8 hours.  Glucose, capillary     Status: None   Collection Time: 10/25/21  5:50 AM  Result Value Ref Range   Glucose-Capillary 82 70 - 99 mg/dL    Comment: Glucose reference range applies only to samples taken after fasting for at least 8 hours.  Comprehensive metabolic panel     Status:  Abnormal   Collection Time: 10/25/21  6:30 AM  Result Value Ref Range   Sodium 135 135 - 145 mmol/L   Potassium 3.1 (L) 3.5 - 5.1 mmol/L   Chloride 96 (L) 98 - 111 mmol/L   CO2 33 (H) 22 - 32 mmol/L   Glucose, Bld 104 (H) 70 - 99 mg/dL    Comment: Glucose reference range applies only to samples taken after fasting for at least 8 hours.   BUN 6 6 - 20 mg/dL   Creatinine, Ser 0.82 0.61 - 1.24 mg/dL   Calcium 8.1 (L) 8.9 - 10.3 mg/dL   Total Protein 7.0 6.5 - 8.1 g/dL   Albumin <1.5 (L) 3.5 - 5.0 g/dL   AST 26 15 - 41 U/L   ALT 24 0 - 44 U/L   Alkaline Phosphatase 207 (H) 38 - 126 U/L   Total Bilirubin 0.7 0.3 - 1.2 mg/dL   GFR, Estimated >60 >60 mL/min    Comment: (NOTE) Calculated using the CKD-EPI Creatinine Equation (2021)    Anion gap 6 5 - 15    Comment: Performed at [facility], [place] [address], [place], [place] [address]  CBC     Status: Abnormal   Collection Time: 10/25/21  6:30 AM  Result Value Ref Range   WBC 12.5 (H) 4.0 - 10.5 K/uL   RBC 2.79 (L) 4.22 - 5.81 MIL/uL   Hemoglobin 8.6 (L) 13.0 - 17.0 g/dL   HCT 27.2 (L) 39.0 - 52.0 %   MCV 97.5 80.0 - 100.0 fL   MCH 30.8 26.0 - 34.0 pg   MCHC 31.6 30.0 - 36.0 g/dL   RDW 21.5 (H) 11.5 - 15.5 %   Platelets 210 150 - 400 K/uL   nRBC 0.0 0.0 - 0.2 %    Comment: Performed at [facility], [place] [address], [place], [place] [address]  Glucose, capillary     Status: None   Collection Time: 10/25/21  7:48 AM  Result Value Ref Range   Glucose-Capillary 86 70 - 99 mg/dL    Comment: Glucose reference range applies only to samples taken after fasting for at least 8 hours.   Comment 1 Notify RN    Comment 2 Document in Chart   Glucose, capillary     Status: None   Collection Time: 10/25/21 12:14 PM  Result Value Ref Range   Glucose-Capillary 94 70 - 99 mg/dL    Comment: Glucose reference range applies only to samples taken after fasting for at least 8 hours.   Comment 1 Notify RN     Comment 2 Document in Chart     Current Facility-Administered Medications  Medication Dose Route Frequency Provider Last Rate Last Admin   (feeding supplement) PROSource Plus liquid 30 mL  30 mL Oral QID Hunsucker, Bonna Gains, MD   30 mL at 10/22/21 1625   0.9 %  sodium chloride infusion  250 mL Intravenous Continuous [name], MD   Stopped at 10/14/21 0548   0.9 %  sodium chloride infusion  250 mL Intravenous Continuous [name], MD  Stopped at 10/21/21 0225   albuterol (PROVENTIL) (2.5 MG/3ML) 0.083% nebulizer solution 2.5 mg  2.5 mg Nebulization Q2H PRN [name], MD       ALPRAZolam Duanne Moron) tablet 1.5 mg  1.5 mg Oral TID PRN Cinderella, Margaret A   1.5 mg at 10/24/21 1943   ascorbic acid (VITAMIN C) tablet 500 mg  500 mg Oral BID Hunsucker, Bonna Gains, MD   500 mg at 10/24/21 1947   bictegravir-emtricitabine-tenofovir AF (BIKTARVY) 50-200-25 MG per tablet 1 tablet  1 tablet Oral Daily Gerald Leitz D, NP   1 tablet at 10/23/21 0940   chlorhexidine (PERIDEX) 0.12 % solution 15 mL  15 mL Mouth Rinse BID Brand Males, MD   15 mL at 10/23/21 2133   Chlorhexidine Gluconate Cloth 2 % PADS 6 each  6 each Topical Q0600 Shawna Clamp, MD   6 each at 10/25/21 0200   dextrose 10 % infusion   Intravenous Continuous Donne Hazel, MD 50 mL/hr at 10/24/21 1052 Rate Change at 10/24/21 1052   docusate sodium (COLACE) capsule 100 mg  100 mg Oral BID PRN Hunsucker, Bonna Gains, MD       escitalopram (LEXAPRO) tablet 20 mg  20 mg Oral Daily Hunsucker, Bonna Gains, MD   20 mg at 10/24/21 1401   famotidine (PEPCID) tablet 20 mg  20 mg Oral BID Donne Hazel, MD   20 mg at 10/24/21 1401   feeding supplement (ENSURE ENLIVE / ENSURE PLUS) liquid 237 mL  237 mL Oral BID BM Donne Hazel, MD   237 mL at 66/06/30 1601   folic acid (FOLVITE) tablet 1 mg  1 mg Oral Daily Hunsucker, Bonna Gains, MD   1 mg at 10/24/21 1401   food thickener (SIMPLYTHICK (NECTAR/LEVEL 2/MILDLY THICK)) 1 packet   1 packet Oral PRN [name], MD       furosemide (LASIX) injection 40 mg  40 mg Intravenous BID Donne Hazel, MD   40 mg at 10/25/21 0932   Gerhardt's butt cream   Topical BID Shawna Clamp, MD   Given at 10/25/21 1635   lactulose (CHRONULAC) 10 GM/15ML solution 10 g  10 g Oral TID Hunsucker, Bonna Gains, MD   10 g at 10/22/21 1626   lip balm (CARMEX) ointment   Topical PRN [name], NP       MEDLINE mouth rinse  15 mL Mouth Rinse q12n4p Brand Males, MD   15 mL at 10/22/21 1627   multivitamin with minerals tablet 1 tablet  1 tablet Oral Daily Hunsucker, Bonna Gains, MD   1 tablet at 10/24/21 1401   mupirocin ointment (BACTROBAN) 2 %   Nasal BID Brand Males, MD   Given at 10/25/21 1031   nicotine (NICODERM CQ - dosed in mg/24 hours) patch 14 mg  14 mg Transdermal Daily [name], MD   14 mg at 10/25/21 1028   OLANZapine (ZYPREXA) tablet 5 mg  5 mg Oral Once Lovey Newcomer T, NP       OLANZapine zydis (ZYPREXA) disintegrating tablet 5 mg  5 mg Oral QHS Loistine Chance, MD   5 mg at 10/24/21 1946   pantoprazole (PROTONIX) EC tablet 40 mg  40 mg Oral BID [name], NP   40 mg at 10/24/21 1401   polyethylene glycol (MIRALAX / GLYCOLAX) packet 17 g  17 g Oral Daily PRN Hunsucker, Bonna Gains, MD       potassium chloride 10 mEq in 100 mL IVPB  10 mEq Intravenous Q1 Hr x 6 Donne Hazel, MD 100 mL/hr at 10/25/21 1634 10 mEq at 10/25/21 1634   sodium chloride flush (NS) 0.9 % injection 10-40 mL  10-40 mL Intracatheter Q12H Shawna Clamp, MD   10 mL at 10/25/21 1040   sodium chloride flush (NS) 0.9 % injection 10-40 mL  10-40 mL Intracatheter PRN Shawna Clamp, MD       tetracycline (SUMYCIN) capsule 500 mg  500 mg Oral BID Hunsucker, Bonna Gains, MD   500 mg at 10/24/21 1943   thiamine tablet 250 mg  250 mg Oral Daily Cinderella, Margaret A   250 mg at 10/24/21 1401   valACYclovir (VALTREX) tablet 1,000 mg  1,000 mg Oral Daily Hunsucker, Bonna Gains, MD   1,000 mg at  10/23/21 0941   zinc sulfate capsule 220 mg  220 mg Oral Daily Hunsucker, Bonna Gains, MD   220 mg at 10/23/21 0940    Musculoskeletal: Strength & Muscle Tone: within normal limits Gait & Station: normal Patient leans: [name]            Psychiatric Specialty Exam:  Presentation  General Appearance: Appropriate for Environment; Casual  Eye Contact:Good  Speech:Clear and Coherent; Normal Rate  Speech Volume:Normal  Handedness:Right   Mood and Affect  Mood:Euthymic  Affect:Appropriate; Congruent   Thought Process  Thought Processes:Coherent; Goal Directed; Linear  Descriptions of Associations:Intact  Orientation:Full (Time, Place and Person)  Thought Content:Logical  History of Schizophrenia/Schizoaffective disorder:No data recorded Duration of Psychotic Symptoms:No data recorded Hallucinations:No data recorded Ideas of Reference:None  Suicidal Thoughts:No data recorded Homicidal Thoughts:No data recorded  Sensorium  Memory:Immediate Fair; Recent Fair; Remote Fair  Judgment:Fair  Insight:Fair   Executive Functions  Concentration:Fair  Attention Span:Fair  [place]   Psychomotor Activity  Psychomotor Activity:No data recorded  Assets  Assets:Desire for Improvement; Financial Resources/Insurance; Leisure Time; Intimacy; Engineer, structural; Physical Health   Sleep  Sleep:No data recorded  Physical Exam: Physical Exam Psychiatric:        Mood and Affect: Mood normal.        Thought Content: Thought content normal.   ROS Blood pressure 114/74, pulse (!) 102, temperature 98.4 F (36.9 C), temperature source Oral, resp. rate 20, height 6' 3"  (1.905 m), weight 86.9 kg, SpO2 97 %. Body mass index is 23.95 kg/m.  Treatment Plan Summary: Daily contact with patient to assess and evaluate symptoms and progress in treatment and Medication management  Consider adjustment to Zyprexa  5 mg p.o. once daily and start Zyprexa Zydis 5 mg po BID and or Zyprexa IM if medication continue to be refused orally.     -Labs reviwed. UDS (was not collected) potassium 3.1.  -Consider repeating  EKG and monitor for QT prolongation   Disposition:  Patient remains psychiatrically cleared and does not appear to meet inpatient admission at this time.    Derrill [place], NP 10/25/2021 5:19 PM

## 2021-10-25 NOTE — Progress Notes (Signed)
OT Cancellation Note  Patient Details Name: [name] MRN: [id number] DOB: [birth year]   Cancelled Treatment:    Reason Eval/Treat Not Completed: Other (comment) Patient noted in chart to have increased agitation this AM. Patient currently in restraints at this time.  OT to continue to follow and check back as schedule will allow.  Jackelyn Poling OTR/L, [facility] Office# [phone] Pager# [phone]  10/25/2021, 9:04 AM

## 2021-10-26 ENCOUNTER — Inpatient Hospital Stay (HOSPITAL_COMMUNITY): Payer: Medicare HMO

## 2021-10-26 DIAGNOSIS — Z66 Do not resuscitate: Secondary | ICD-10-CM | POA: Diagnosis not present

## 2021-10-26 DIAGNOSIS — L03317 Cellulitis of buttock: Secondary | ICD-10-CM | POA: Diagnosis not present

## 2021-10-26 DIAGNOSIS — R6521 Severe sepsis with septic shock: Secondary | ICD-10-CM | POA: Diagnosis not present

## 2021-10-26 DIAGNOSIS — M87052 Idiopathic aseptic necrosis of left femur: Secondary | ICD-10-CM | POA: Diagnosis not present

## 2021-10-26 DIAGNOSIS — F4310 Post-traumatic stress disorder, unspecified: Secondary | ICD-10-CM

## 2021-10-26 DIAGNOSIS — A419 Sepsis, unspecified organism: Secondary | ICD-10-CM | POA: Diagnosis not present

## 2021-10-26 LAB — CBC
HCT: 25.1 % — ABNORMAL LOW (ref 39.0–52.0)
Hemoglobin: 7.8 g/dL — ABNORMAL LOW (ref 13.0–17.0)
MCH: 30.4 pg (ref 26.0–34.0)
MCHC: 31.1 g/dL (ref 30.0–36.0)
MCV: 97.7 fL (ref 80.0–100.0)
Platelets: 195 10*3/uL (ref 150–400)
RBC: 2.57 MIL/uL — ABNORMAL LOW (ref 4.22–5.81)
RDW: 21.7 % — ABNORMAL HIGH (ref 11.5–15.5)
WBC: 13.2 10*3/uL — ABNORMAL HIGH (ref 4.0–10.5)
nRBC: 0 % (ref 0.0–0.2)

## 2021-10-26 LAB — GLUCOSE, CAPILLARY
Glucose-Capillary: 101 mg/dL — ABNORMAL HIGH (ref 70–99)
Glucose-Capillary: 107 mg/dL — ABNORMAL HIGH (ref 70–99)
Glucose-Capillary: 121 mg/dL — ABNORMAL HIGH (ref 70–99)
Glucose-Capillary: 84 mg/dL (ref 70–99)
Glucose-Capillary: 86 mg/dL (ref 70–99)
Glucose-Capillary: 91 mg/dL (ref 70–99)
Glucose-Capillary: 92 mg/dL (ref 70–99)

## 2021-10-26 LAB — COMPREHENSIVE METABOLIC PANEL
ALT: 21 U/L (ref 0–44)
AST: 23 U/L (ref 15–41)
Albumin: <1.5 g/dL — ABNORMAL LOW (ref 3.5–5.0)
Alkaline Phosphatase: 199 U/L — ABNORMAL HIGH (ref 38–126)
Anion gap: 7 (ref 5–15)
BUN: 8 mg/dL (ref 6–20)
CO2: 34 mmol/L — ABNORMAL HIGH (ref 22–32)
Calcium: 8 mg/dL — ABNORMAL LOW (ref 8.9–10.3)
Chloride: 93 mmol/L — ABNORMAL LOW (ref 98–111)
Creatinine, Ser: 0.98 mg/dL (ref 0.61–1.24)
GFR, Estimated: >60 mL/min (ref >60)
Glucose, Bld: 122 mg/dL — ABNORMAL HIGH (ref 70–99)
Potassium: 3.1 mmol/L — ABNORMAL LOW (ref 3.5–5.1)
Sodium: 134 mmol/L — ABNORMAL LOW (ref 135–145)
Total Bilirubin: 0.6 mg/dL (ref 0.3–1.2)
Total Protein: 6.6 g/dL (ref 6.5–8.1)

## 2021-10-26 MED ORDER — LORAZEPAM 1 MG PO TABS
1.0000 mg | ORAL_TABLET | Freq: Two times a day (BID) | ORAL | Status: DC | PRN
  Filled 2021-10-26: qty 1

## 2021-10-26 MED ORDER — LORAZEPAM 1 MG PO TABS
2.0000 mg | ORAL_TABLET | Freq: Two times a day (BID) | ORAL | Status: DC
  Administered 2021-10-26 – 2021-11-04 (×17): 2 mg via ORAL
  Filled 2021-10-26 (×19): qty 2

## 2021-10-26 MED ORDER — ACETAMINOPHEN 325 MG PO TABS
650.0000 mg | ORAL_TABLET | Freq: Four times a day (QID) | ORAL | Status: DC | PRN
  Administered 2021-10-26 – 2021-11-04 (×3): 650 mg via ORAL
  Filled 2021-10-26 (×3): qty 2

## 2021-10-26 MED ORDER — ACETAMINOPHEN 650 MG RE SUPP: 650.0000 mg | Freq: Four times a day (QID) | RECTAL | Status: DC | PRN

## 2021-10-26 MED ORDER — POTASSIUM CHLORIDE 10 MEQ/100ML IV SOLN
10.0000 meq | INTRAVENOUS | Status: AC
  Administered 2021-10-26 (×6): 10 meq via INTRAVENOUS
  Filled 2021-10-26: qty 100

## 2021-10-26 NOTE — Progress Notes (Signed)
Daily Progress Note   Patient Name: [name]       Date: 10/26/2021 DOB: August 10, 1986  Age: 36 y.o. MRN#: [id number] Attending Physician: Drew Hazel, MD Primary Care Physician: Drew Ebbs, MD Admit Date: 10/06/2021  Reason for Consultation/Follow-up: Establishing goals of care  Subjective: I saw and examined [name].  He was lying in bed in no distress. Still having swallowing difficulties.     Length of Stay: 19  Current Medications: Scheduled Meds:   (feeding supplement) PROSource Plus  30 mL Oral QID   vitamin C  500 mg Oral BID   bictegravir-emtricitabine-tenofovir AF  1 tablet Oral Daily   chlorhexidine  15 mL Mouth Rinse BID   Chlorhexidine Gluconate Cloth  6 each Topical Q0600   escitalopram  20 mg Oral Daily   famotidine  20 mg Oral BID   feeding supplement  237 mL Oral BID BM   folic acid  1 mg Oral Daily   furosemide  40 mg Intravenous BID   Gerhardt's butt cream   Topical BID   lactulose  10 g Oral TID   mouth rinse  15 mL Mouth Rinse q12n4p   multivitamin with minerals  1 tablet Oral Daily   mupirocin ointment   Nasal BID   nicotine  14 mg Transdermal Daily   OLANZapine  5 mg Oral Once   OLANZapine zydis  5 mg Oral QHS   pantoprazole  40 mg Oral BID   sodium chloride flush  10-40 mL Intracatheter Q12H   tetracycline  500 mg Oral BID   thiamine  250 mg Oral Daily   valACYclovir  1,000 mg Oral Daily   zinc sulfate  220 mg Oral Daily    Continuous Infusions:  sodium chloride Stopped (10/14/21 0548)   sodium chloride Stopped (10/21/21 0225)   dextrose 50 mL/hr at 10/25/21 1725   potassium chloride 10 mEq (10/26/21 1422)    PRN Meds: albuterol, ALPRAZolam, docusate sodium, food thickener, lip balm, polyethylene glycol, sodium chloride  flush  Physical Exam         General: awake alert no distress Has regular work of breathing Appears chronically ill Not agitated   Vital Signs: BP 101/62 (BP Location: Left Arm)    Pulse 89    Temp 98.3 F (36.8 C) (Oral)  Resp 18    Ht 6' 3"  (1.905 m)    Wt 86.9 kg    SpO2 95%    BMI 23.95 kg/m  SpO2: SpO2: 95 % O2 Device: O2 Device: Room Air O2 Flow Rate: O2 Flow Rate (L/min): 2 L/min  Intake/output summary:  Intake/Output Summary (Last 24 hours) at 10/26/2021 1521 Last data filed at 10/26/2021 1422 Gross per 24 hour  Intake 400 ml  Output 2500 ml  Net -2100 ml    LBM: Last BM Date: 10/26/21 Baseline Weight: Weight: 108.9 kg Most recent weight: Weight: 86.9 kg       Palliative Assessment/Data:    Flowsheet Rows    Flowsheet Row Most Recent Value  Intake Tab   Referral Department Critical care  Unit at Time of Referral ICU  Palliative Care Primary Diagnosis Sepsis/Infectious Disease  Date Notified 10/09/21  Palliative Care Type New Palliative care  Reason for referral Clarify Goals of Care  Date of Admission 10/06/21  Date first seen by Palliative Care 10/10/21  # of days Palliative referral response time 1 Day(s)  # of days IP prior to Palliative referral 3  Clinical Assessment   Palliative Performance Scale Score 30%  Psychosocial & Spiritual Assessment   Palliative Care Outcomes   Patient/Family meeting held? Yes  Who was at the meeting? Patient, mother via phone       Patient Active Problem List   Diagnosis Date Noted   Malnutrition of moderate degree 10/20/2021   Palliative care by specialist    DNR (do not resuscitate) 10/14/2021   Aspiration pneumonia ([place]) 10/14/2021   FTT (failure to thrive) in adult 10/14/2021   Dysphagia 10/14/2021   Coagulopathy ([place]) 10/14/2021   Protein-calorie malnutrition, severe ([place]) 10/09/2021   HIV positive ([place]) 10/07/2021   Avascular necrosis of femoral head, left ([place]) 10/07/2021   Hepatic steatosis  10/07/2021   Cellulitis of multiple sites of buttock 10/07/2021   Hypotension    Hypoxia 08/24/2021   Left hip pain    Transaminitis    Acute metabolic encephalopathy 08/03/8526   Cellulitis 05/07/2021   Lymphadenopathy 09/29/2020   Anemia    Upper urinary tract infection    Sepsis secondary to UTI ([place]) 03/14/2020   Cellulitis of groin 03/14/2020   Elevated LFTs 03/14/2020   HIV disease ([place])    Acute encephalopathy    Macrocytic anemia    Thrombocytopenia (HCC)    Prolonged QT interval    Acute respiratory failure due to COVID-19 ([place]) 10/27/2019   GERD (gastroesophageal reflux disease) 10/27/2019   Hypokalemia 10/27/2019   Peripheral neuropathy 10/01/2019   PTSD (post-traumatic stress disorder) 07/23/2018   Dizziness and giddiness 02/01/2016   Migraine headache 02/01/2016   Schizoaffective disorder, depressive type ([place]) 05/13/2015   Severe alcohol dependence ([place]) 05/13/2015   Suicidal ideation 01/12/2014    Palliative Care Assessment & Plan   Patient Profile: 36 y.o. male  with past medical history of HIV, bipolar, depression, hypertension, migraine, schizoaffective disorder, seizures admitted on 10/06/2021 with hypothermia and concern for infected wounds.  At time of admission, he was started on Zosyn and vancomycin.  Labs revealed hypokalemia and elevated lactate.  Fluid resuscitation started.  He is remained very weak and continues to have significant dysphagia.  NG tube was placed to dysphagia.  Palliative consulted for goals of care.  Recommendations/Plan: DNR DNI Monitor PO intake and hospital course  Likely for SNF placement with palliative to follow.   Goals of Care and Additional Recommendations: Limitations on  Scope of Treatment: Full Scope Treatment   Prognosis:  Unable to determine  Discharge Planning: To Be Determined  Care plan was discussed with IDT   Thank you for allowing the Palliative Medicine Team to assist in the care of this  patient.  Loistine Chance, MD  Please contact Palliative Medicine Team phone at [phone] for questions and concerns.

## 2021-10-26 NOTE — Progress Notes (Signed)
PROGRESS NOTE    [name]  OHY:[id number] DOB: 1986-01-17 DOA: 10/06/2021 PCP: [name], MD    Brief Narrative:  36 years old African-American male with PMH significant for tobacco abuse, HIV+ , bipolar disorder, hypertension, migraine, peripheral neuropathy, PTSD, schizoaffective disorder, seizure disorder presented in the ED with complaints of sores on his buttocks.  Patient had a visit from home health services who raised the concern that his sores have become infected.  Patient was noted to be hypothermic on arrival.  Labs in the ER showed lactic acid 6.4, potassium 2.7, abnormal LFTs.  Patient was hypotensive requiring pressor support.  Patient was agitated and restless requiring Precedex.  Subsequently patient was intubated and admitted in the ICU.  He is now off pressors, off Precedex drip. Septic shock resolved.  Patient continued to remain confused and has no capacity to make decisions. Palliative care consulted to discuss goals of care.  Mother is understanding that he has poor prognosis.  She is receptive to consider the options of hospice. PCCM pickup 10/15/2021.  Patient is currently on room air, he is on comfort feeds,  he has a Foley catheter, remains afebrile, He is very deconditioned  Assessment & Plan:   Active Problems:   Schizoaffective disorder, depressive type (HCC)   Hypokalemia   Elevated LFTs   Acute encephalopathy   Macrocytic anemia   Thrombocytopenia (HCC)   Prolonged QT interval   HIV positive (HCC)   Avascular necrosis of femoral head, left (HCC)   Hepatic steatosis   Cellulitis of multiple sites of buttock   Protein-calorie malnutrition, severe (HCC)   DNR (do not resuscitate)   Aspiration pneumonia ([place])   FTT (failure to thrive) in adult   Dysphagia   Coagulopathy ([place])   Palliative care by specialist   Malnutrition of moderate degree  Septic shock sec. to E. coli UTI /  LE ulcerations: Patient completed course of antibiotics on  10/15/2021. Sepsis physiology resolved, Pt was to continue midodrine, however pt has been refusing medications.  BP remains well controlled, thus have d/c'd midodrine Foley has since been removed, cont to void well   Acute on chronic metabolic encephalopathy: Complicated by bipolar disorder, schizoaffective, EtOH abuse, opioid dependence and PTSD, Pt had been oriented and demonstrated insight to care on 1/12 Around 1/14, pt noted to be largely non-verbal, intermittently yelling religious statements, refusing medical treatment Asked Psych to re-eval. Recs for adjustment to zyprexa 43m daily and start zyprexa zydis 546mbid or zyprexa IM if pt continues to refuse meds Psychiatry has also recommended starting BID ativan with goal of weaning off benzos   Chronic hypotension : Completed stress dose steroids. Pt had been refusing midodrine lately, BP remains stable and controlled Have d/c'd midodrine BP remains stable   Epistaxis: > resolved Suspect bleeding from repeated NG tube insertions in context of thrombocytopenia and coagulopathy. Continue aspiration precautions.   Acute on chronic dysphagia: Patient failed dysphagia 3 diet, Speech recommended regular diet with thick liquids. Pt observed with coughing while eating. Had requested SLP re-eval   EtOH cirrhosis: Chronic elevated ammonia: Has been refusing lactulose doses Cont with rectal tube with stool output.  Goal 2-3 BM daily Ammonia 30 on most recent check   HIV: Continue Biktarvy.   Mild coagulopathy: Suspect due to chronic liver disease.   Chronic anemia / thrombocytopenia: Suspected secondary to hx of EtOH abuse. S/p 2 units of platelets and 3 unit PRBC. Hemoglobin has remained stable, Continue to hold anticoagulation given hemoglobin  drop this visit Continue SCDs as tolerated   Severe deconditioning. Failure to thrive and protein calorie malnutrition: Patient was seen by palliative care. Maximize nutrition, needs  long-term placement. Cont diet as pt tolerates   Open wound right upper arm:  Multiple scarring from prior ulceration. Continue Xeroform gauze dressing,   Continue tetracycline as tolerated   Hypomagnesemia: Replaced  Hypoglycemia -still dependent on dextrose containing fluids -Cont to encourage PO as tolerated, liberalize diet as tolerated -Insulin and cpeptide levels are low, proinsulin pending -Pt more catatonic and not eating much, noted to be hypoglycemic with glucose in the 60's -Will increase D10 to 100cc/hr  Anasarca -Gross edema throughout -Suspect related to third spacing from malnutrition with albumin <1.5 -Improving with BID IV lasix with good diuresis -Recheck BMET in AM   DVT prophylaxis: SCD's Code Status: DNR Family Communication: Pt in room, family not at bedside  Status is: Inpatient  Remains inpatient appropriate because: severity of illness   Consultants:  PCCM Urology Psychiatrty Palliative Care  Procedures:    Antimicrobials: Anti-infectives (From admission, onward)    Start     Dose/Rate Route Frequency Ordered Stop   10/14/21 1000  valACYclovir (VALTREX) tablet 1,000 mg        1,000 mg Oral Daily 10/13/21 1152     10/13/21 2000  tetracycline (SUMYCIN) capsule 500 mg        500 mg Oral 2 times daily 10/13/21 1152     10/12/21 0930  tetracycline (SUMYCIN) capsule 500 mg  Status:  Discontinued        500 mg Per Tube 2 times daily 10/12/21 0918 10/13/21 1152   10/10/21 1000  valACYclovir (VALTREX) tablet 1,000 mg  Status:  Discontinued        1,000 mg Per Tube Daily 10/09/21 1344 10/13/21 1152   10/09/21 2000  cefTRIAXone (ROCEPHIN) 2 g in sodium chloride 0.9 % 100 mL IVPB        2 g 200 mL/hr over 30 Minutes Intravenous Every 24 hours 10/09/21 1705 10/15/21 2143   10/07/21 1800  ceFEPIme (MAXIPIME) 2 g in sodium chloride 0.9 % 100 mL IVPB  Status:  Discontinued        2 g 200 mL/hr over 30 Minutes Intravenous Every 8 hours 10/07/21 0954  10/09/21 1705   10/07/21 1230  bictegravir-emtricitabine-tenofovir AF (BIKTARVY) 50-200-25 MG per tablet 1 tablet        1 tablet Oral Daily 10/07/21 1138     10/07/21 1200  metroNIDAZOLE (FLAGYL) IVPB 500 mg  Status:  Discontinued        500 mg 100 mL/hr over 60 Minutes Intravenous Every 12 hours 10/07/21 0951 10/08/21 1601   10/07/21 1200  vancomycin (VANCOREADY) IVPB 1750 mg/350 mL  Status:  Discontinued        1,750 mg 175 mL/hr over 120 Minutes Intravenous Every 12 hours 10/07/21 0958 10/09/21 1705   10/07/21 1100  ceFEPIme (MAXIPIME) 2 g in sodium chloride 0.9 % 100 mL IVPB        2 g 200 mL/hr over 30 Minutes Intravenous  Once 10/07/21 0952 10/07/21 1236   10/07/21 1000  valACYclovir (VALTREX) tablet 1,000 mg  Status:  Discontinued        1,000 mg Oral Daily 10/07/21 0232 10/09/21 1344   10/06/21 2000  piperacillin-tazobactam (ZOSYN) IVPB 3.375 g        3.375 g 100 mL/hr over 30 Minutes Intravenous  Once 10/06/21 1954 10/06/21 2238   10/06/21 2000  vancomycin (  VANCOREADY) IVPB 2000 mg/400 mL        2,000 mg 200 mL/hr over 120 Minutes Intravenous  Once 10/06/21 1955 10/06/21 2350       Subjective: Cont to be largely non-verbal but tracks with eyes and nods   Objective: Vitals:   10/25/21 0554 10/25/21 1612 10/25/21 2003 10/26/21 0544  BP: 111/74 114/74 109/79 101/62  Pulse: 87 (!) 102 100 89  Resp: 16 20 18 18   Temp: 98.2 F (36.8 C) 98.4 F (36.9 C) 99.2 F (37.3 C) 98.3 F (36.8 C)  TempSrc: Oral Oral Oral Oral  SpO2: 97% 97% 98% 95%  Weight: 86.9 kg     Height:        Intake/Output Summary (Last 24 hours) at 10/26/2021 1601 Last data filed at 10/26/2021 1422 Gross per 24 hour  Intake 400 ml  Output 2500 ml  Net -2100 ml    Filed Weights   10/20/21 0500 10/23/21 0700 10/25/21 0554  Weight: 110.4 kg 79.8 kg 86.9 kg    Examination: General exam: Awake, laying in bed, in nad Respiratory system: Normal respiratory effort, no wheezing Cardiovascular  system: regular rate, s1, s2 Gastrointestinal system: Soft, nondistended, positive BS Central nervous system: CN2-12 grossly intact, strength intact Extremities: Perfused, no clubbing Skin: Normal skin turgor, no notable skin lesions seen Psychiatry: Unable to assess given nonverbal state   Data Reviewed: I have personally reviewed following labs and imaging studies  CBC: Recent Labs  Lab 10/21/21 0500 10/23/21 0949 10/24/21 0605 10/25/21 0630 10/26/21 0513  WBC 13.1* 11.3* 10.5 12.5* 13.2*  HGB 8.2* 8.0* 7.5* 8.6* 7.8*  HCT 26.8* 25.5* 24.0* 27.2* 25.1*  MCV 100.0 98.1 97.6 97.5 97.7  PLT 153 181 189 210 335    Basic Metabolic Panel: Recent Labs  Lab 10/20/21 0640 10/21/21 0500 10/23/21 0949 10/24/21 0605 10/25/21 0630 10/26/21 0513  NA 136 136 135 137 135 134*  K 4.1 4.3 3.4* 3.2* 3.1* 3.1*  CL 105 103 96* 94* 96* 93*  CO2 26 27 31  34* 33* 34*  GLUCOSE 78 87 84 91 104* 122*  BUN 7 7 9 7 6 8   CREATININE 0.76 0.85 0.92 0.83 0.82 0.98  CALCIUM 8.1* 8.2* 8.1* 8.1* 8.1* 8.0*  MG 1.5* 2.6*  --   --   --   --   PHOS 3.8  --   --   --   --   --     GFR: Estimated Creatinine Clearance: 123.3 mL/min (by C-G formula based on SCr of 0.98 mg/dL). Liver Function Tests: Recent Labs  Lab 10/21/21 0500 10/23/21 0949 10/24/21 0605 10/25/21 0630 10/26/21 0513  AST 21 24 22 26 23   ALT 21 25 22 24 21   ALKPHOS 179* 211* 188* 207* 199*  BILITOT 0.7 0.6 0.8 0.7 0.6  PROT 5.9* 6.3* 6.0* 7.0 6.6  ALBUMIN <1.5* <1.5* <1.5* <1.5* <1.5*    No results for input(s): LIPASE, AMYLASE in the last 168 hours. Recent Labs  Lab 10/24/21 0605  AMMONIA 30    Coagulation Profile: No results for input(s): INR, PROTIME in the last 168 hours.  Cardiac Enzymes: No results for input(s): CKTOTAL, CKMB, CKMBINDEX, TROPONINI in the last 168 hours. BNP (last 3 results) No results for input(s): PROBNP in the last 8760 hours. HbA1C: No results for input(s): HGBA1C in the last 72  hours. CBG: Recent Labs  Lab 10/25/21 2207 10/26/21 0142 10/26/21 0417 10/26/21 0751 10/26/21 1207  GLUCAP 119* 92 84 86 121*  Lipid Profile: No results for input(s): CHOL, HDL, LDLCALC, TRIG, CHOLHDL, LDLDIRECT in the last 72 hours. Thyroid Function Tests: No results for input(s): TSH, T4TOTAL, FREET4, T3FREE, THYROIDAB in the last 72 hours. Anemia Panel: No results for input(s): VITAMINB12, FOLATE, FERRITIN, TIBC, IRON, RETICCTPCT in the last 72 hours. Sepsis Labs: No results for input(s): PROCALCITON, LATICACIDVEN in the last 168 hours.  No results found for this or any previous visit (from the past 240 hour(s)).   Radiology Studies: No results found.  Scheduled Meds:  (feeding supplement) PROSource Plus  30 mL Oral QID   vitamin C  500 mg Oral BID   bictegravir-emtricitabine-tenofovir AF  1 tablet Oral Daily   chlorhexidine  15 mL Mouth Rinse BID   Chlorhexidine Gluconate Cloth  6 each Topical Q0600   escitalopram  20 mg Oral Daily   famotidine  20 mg Oral BID   feeding supplement  237 mL Oral BID BM   folic acid  1 mg Oral Daily   furosemide  40 mg Intravenous BID   Gerhardt's butt cream   Topical BID   lactulose  10 g Oral TID   mouth rinse  15 mL Mouth Rinse q12n4p   multivitamin with minerals  1 tablet Oral Daily   mupirocin ointment   Nasal BID   nicotine  14 mg Transdermal Daily   OLANZapine  5 mg Oral Once   OLANZapine zydis  5 mg Oral QHS   pantoprazole  40 mg Oral BID   sodium chloride flush  10-40 mL Intracatheter Q12H   tetracycline  500 mg Oral BID   thiamine  250 mg Oral Daily   valACYclovir  1,000 mg Oral Daily   zinc sulfate  220 mg Oral Daily   Continuous Infusions:  sodium chloride Stopped (10/14/21 0548)   sodium chloride Stopped (10/21/21 0225)   dextrose 50 mL/hr at 10/25/21 1725     LOS: 19 days   Marylu Lund, MD [facility] Pager On Amion  If 7PM-7AM, please contact night-coverage 10/26/2021, 4:01 PM

## 2021-10-26 NOTE — Care Management Important Message (Signed)
Important Message  Patient Details IM Letter placed in Patients room. Name: [name] MRN: [id number] Date of Birth: 1986/04/13   Medicare Important Message Given:  Yes     Kerin Salen 10/26/2021, 10:21 AM

## 2021-10-26 NOTE — Consult Note (Signed)
Brief Psychiatry Consult Note  The patient was last seen by the psychiatry service on 1/13 at which point psychiatry signed off. He is medically admitted for FTT and infected wounds. He is diagnosed with schizoaffective disorder, borderline personality disorder, and PTSD. Interim documentation by primary team and nursing staff has been reviewed; psychiatry was reconsulted for withdrawal, mutism, hyper-religiosity on 1/15 but patient was not seen in the interim. His current medications include escitalopram 20 mg and olanzapine 6 mg (changed from home lurasidone due to poor PO intake). Notably, his alprazolam was switched from 2 mg TID --> 1.5 mg TID PRN and he has not gotten this medication for 2 days. On exam, he was mute with <20 words/5 min, immobile, had poor PO intake (although this is not a clear change from baseline), and exhibited a fixed gaze. He was oriented to self and generally to situation; 3/4 on CAM-ICU (failed "is 1 pound more than 2 pounds). He nodded when asked if he was OK switching Xanax to Ativan.  Due to marked withdrawal after decrease in BZD, BZD w/d catatonia currently at top of differential; will make medication changes below and re-evaluate pt tomorrow or Thursday.   - alprazolam 1 mg TID PRN --> lorazepam 2 mg BID, 1 mg BID PRN.  - no other changes - will continue to follow   I personally spent 15 minutes on the unit in direct patient care. The direct patient care time included face-to-face time with the patient, reviewing the patient's chart, communicating with other professionals, and coordinating care. Greater than 50% of this time was spent in counseling or coordinating care with the patient regarding goals of hospitalization, psycho-education, and discharge planning needs.   Lylianna Fraiser A [place] Mclester

## 2021-10-26 NOTE — Progress Notes (Signed)
°   10/26/21 1940  Assess: MEWS Score  Temp 100.2 F (37.9 C)  BP 98/63  Pulse Rate (!) 109  Resp 16  SpO2 94 %  O2 Device Room Air  Assess: MEWS Score  MEWS Temp 0  MEWS Systolic 1  MEWS Pulse 1  MEWS RR 0  MEWS LOC 0  MEWS Score 2  MEWS Score Color Yellow  Assess: SIRS CRITERIA  SIRS Temperature  0  SIRS Pulse 1  SIRS Respirations  0  SIRS WBC 0  SIRS Score Sum  1   Informed NP Gershon Cull of vital signs and yellow mews due to low SBP and pulse with slightly elevated temperature. This charge nurse is aware and will continue to monitor.

## 2021-10-27 LAB — CBC
HCT: 26.1 % — ABNORMAL LOW (ref 39.0–52.0)
Hemoglobin: 8.1 g/dL — ABNORMAL LOW (ref 13.0–17.0)
MCH: 30.6 pg (ref 26.0–34.0)
MCHC: 31 g/dL (ref 30.0–36.0)
MCV: 98.5 fL (ref 80.0–100.0)
Platelets: 206 10*3/uL (ref 150–400)
RBC: 2.65 MIL/uL — ABNORMAL LOW (ref 4.22–5.81)
RDW: 21.5 % — ABNORMAL HIGH (ref 11.5–15.5)
WBC: 13.3 10*3/uL — ABNORMAL HIGH (ref 4.0–10.5)
nRBC: 0 % (ref 0.0–0.2)

## 2021-10-27 LAB — GLUCOSE, CAPILLARY
Glucose-Capillary: 95 mg/dL (ref 70–99)
Glucose-Capillary: 87 mg/dL (ref 70–99)
Glucose-Capillary: 108 mg/dL — ABNORMAL HIGH (ref 70–99)
Glucose-Capillary: 94 mg/dL (ref 70–99)
Glucose-Capillary: 94 mg/dL (ref 70–99)

## 2021-10-27 LAB — COMPREHENSIVE METABOLIC PANEL
ALT: 21 U/L (ref 0–44)
AST: 25 U/L (ref 15–41)
Albumin: <1.5 g/dL — ABNORMAL LOW (ref 3.5–5.0)
Alkaline Phosphatase: 203 U/L — ABNORMAL HIGH (ref 38–126)
Anion gap: 7 (ref 5–15)
BUN: 11 mg/dL (ref 6–20)
CO2: 31 mmol/L (ref 22–32)
Calcium: 8.2 mg/dL — ABNORMAL LOW (ref 8.9–10.3)
Chloride: 96 mmol/L — ABNORMAL LOW (ref 98–111)
Creatinine, Ser: 1.32 mg/dL — ABNORMAL HIGH (ref 0.61–1.24)
GFR, Estimated: >60 mL/min (ref >60)
Glucose, Bld: 117 mg/dL — ABNORMAL HIGH (ref 70–99)
Potassium: 3.5 mmol/L (ref 3.5–5.1)
Sodium: 134 mmol/L — ABNORMAL LOW (ref 135–145)
Total Bilirubin: 0.9 mg/dL (ref 0.3–1.2)
Total Protein: 6.8 g/dL (ref 6.5–8.1)

## 2021-10-27 NOTE — Evaluation (Signed)
SLP Cancellation Note  Patient Details Name: [name] MRN: [id number] DOB: 1986/03/26   Cancelled treatment:       Reason Eval/Treat Not Completed: Other (comment) (pt working with OT at this time, per RN, pt consumed all of his breakfast - had episode of AMS earlier this week - but today improved per RN, will continue efforts) Kathleen Lime, MS [place] Office [phone] Cell [phone]   [name] 10/27/2021, 11:55 AM

## 2021-10-27 NOTE — Consult Note (Signed)
Brief Psychiatry Consult Note  The patient was last seen by the psychiatry service on 1/18  at which point we recommended starting lorazepam due to pt withdrawal, not eating, staring, decrease in functional status; he had been on Xanax earlier in admission (had made PRN but pt did not request it for 2-3 days). He is medically admitted for FTT and infected wounds. He is diagnosed with schizoaffective disorder, borderline personality disorder, and PTSD. Interim documentation by primary team and nursing staff has been reviewed; patient with large improvement in 24 hours (ate 100% of breakfast, now participating with OT/PT) - on my exam he was pleasant, conversed about the TV he was watching (Days of our Lives), had insight into his need for ongoing physical therapy, and overall had dramatic day to day improvement in his overall status. Exam is most c/w BZD w/d catatonia, now improved after BZD restarted. Has had functional taper since prior to admission (on 6 mg Xanax now to 4-6 mg lorazepam which is over 50% decrease); although generally recommend reducing this medication (worsens disinhibition, habit forming, falls risk) would not decrease again for at least 1 week and would taper slowly over several months.   - c lorazepam 2 mg BID, 1 mg BID PRN.  - no other changes - will sign off; please do not hesitate to reconsult if additional questions arise.    I personally spent 15 minutes on the unit in direct patient care. The direct patient care time included face-to-face time with the patient, reviewing the patient's chart, communicating with other professionals, and coordinating care. Greater than 50% of this time was spent in counseling or coordinating care with the patient regarding goals of hospitalization, psycho-education, and discharge planning needs.   [name] A Najae Rathert

## 2021-10-27 NOTE — Progress Notes (Signed)
Nutrition Follow-up  DOCUMENTATION CODES:   Non-severe (moderate) malnutrition in context of chronic illness  INTERVENTION:   -Prosource Plus PO QID, each provides 100 kcals and 15g protein  -Ensure Plus High Protein po BID, each supplement provides 350 kcal and 20 grams of protein.  NUTRITION DIAGNOSIS:   Moderate Malnutrition related to chronic illness (HIV) as evidenced by mild fat depletion, mild muscle depletion.  Ongoing.  GOAL:   Patient will meet greater than or equal to 90% of their needs  Progressing.  MONITOR:   PO intake, Supplement acceptance, Labs, Weight trends  ASSESSMENT:   36 y.o. male with medical history of HIV, HTN, PTSD, bipolar 1 disorder, depression, seizures, migraine headaches, and peripheral neuropathy. He presented to the ED via EMS due to soreness to buttocks.  12/28: admitted  Patient consumed most of his breakfast this morning (consisting of eggs, bacon, cream of wheat). Accepting Prosource and Ensure today. AMS improved today.  Admission weight: 240 lbs. Current weight: 182 lbs  Medications reviewed.  Labs reviewed:  CBGs: 87-108  Diet Order:   Diet Order             Diet regular Room service appropriate? Yes; Fluid consistency: Nectar Thick  Diet effective now                   EDUCATION NEEDS:   Not appropriate for education at this time  Skin:  Skin Assessment: Reviewed RN Assessment ([facility] note from 12/29)  Last BM:  1/18 -type 7  Height:   Ht Readings from Last 1 Encounters:  10/06/21 6' 3"  (1.905 m)    Weight:   Wt Readings from Last 1 Encounters:  10/27/21 82.6 kg    Ideal Body Weight:  89.1 kg  BMI:  Body mass index is 22.76 kg/m.  Estimated Nutritional Needs:   Kcal:  2180-2400 kcal  Protein:  110-125 grams  Fluid:  >/= 2.3 L/day  [name] Bibles, MS, RD, LDN Inpatient Clinical Dietitian Contact information available via Amion

## 2021-10-27 NOTE — Progress Notes (Signed)
Occupational Therapy Treatment Patient Details Name: [name] MRN: [id number] DOB: [birth year] Today's Date: 10/27/2021   History of present illness 36 yo male admitted from home with c/o buttock wounds, and dx with FTT and acute encephalopathy.  Pt with hx of HIV, PTSD, schizoaffective d/o, Sz, L hip AVN, neuropathy, polysubstance abuse   OT comments  Patient was cooperative with therapy today and motivated to get to recliner in room. Patient was able to engage in bathing/dressing tasks for Ub sitting on edge of bed. Patient was min A +2 to transfer from edge of bed to recliner with RW and education for proper hand and foot placement. Patient would continue to benefit from skilled OT services at this time while admitted to address noted deficits in order to improve overall safety and independence in ADLs.     Recommendations for follow up therapy are one component of a multi-disciplinary discharge planning process, led by the attending physician.  Recommendations may be updated based on patient status, additional functional criteria and insurance authorization.    Follow Up Recommendations  No OT follow up    Assistance Recommended at Discharge Set up Supervision/Assistance  Patient can return home with the following  A little help with walking and/or transfers;A little help with bathing/dressing/bathroom;Assistance with cooking/housework;Assist for transportation   Equipment Recommendations  None recommended by OT    Recommendations for Other Services      Precautions / Restrictions Precautions Precautions: Fall;Other (comment) Precaution Comments: rectal tube in place Restrictions Weight Bearing Restrictions: No Other Position/Activity Restrictions: wounds to bottom       Mobility Bed Mobility Overal bed mobility: Needs Assistance Bed Mobility: Supine to Sit     Supine to sit: Min assist, HOB elevated Sit to supine: Min assist   General bed mobility comments: pt  required cues for sequencing and increased time to initiate raising trunk. Assist to fully bring LE's off EOB and to pivot hips with bed pad to prevent sheering.    Transfers                         Balance                                           ADL either performed or assessed with clinical judgement   ADL Overall ADL's : Needs assistance/impaired     Grooming: Wash/dry hands;Wash/dry face;Set up;Sitting Grooming Details (indicate cue type and reason): EOB Upper Body Bathing: Set up;Sitting Upper Body Bathing Details (indicate cue type and reason): EOB with increased time             Toilet Transfer: +2 for safety/equipment;Minimal assistance;Rolling walker (2 wheels) Toilet Transfer Details (indicate cue type and reason): patient simultated transfer +2 min A for safety to recliner with RW and increased time.                Extremity/Trunk Assessment              Vision       Perception     Praxis      Cognition Arousal/Alertness: Awake/alert Behavior During Therapy: WFL for tasks assessed/performed Overall Cognitive Status: Within Functional Limits for tasks assessed  General Comments: patient was noted to be appropirate with particiapte up until comments made at end of session        Exercises      Shoulder Instructions       General Comments      Pertinent Vitals/ Pain       Pain Assessment Pain Assessment: Faces Faces Pain Scale: Hurts a little bit Pain Location: bottom and LE's Pain Descriptors / Indicators: Sore, Discomfort Pain Intervention(s): Limited activity within patient's tolerance, Monitored during session, Repositioned  Home Living                                          Prior Functioning/Environment              Frequency  Min 2X/week        Progress Toward Goals  OT Goals(current goals can now be found in the care  plan section)  Progress towards OT goals: Progressing toward goals     Plan Discharge plan remains appropriate    Co-evaluation    PT/OT/SLP Co-Evaluation/Treatment: Yes Reason for Co-Treatment: For patient/therapist safety;To address functional/ADL transfers PT goals addressed during session: Mobility/safety with mobility OT goals addressed during session: ADL's and self-care      AM-PAC OT "6 Clicks" Daily Activity     Outcome Measure   Help from another person eating meals?: None Help from another person taking care of personal grooming?: A Little Help from another person toileting, which includes using toliet, bedpan, or urinal?: A Little Help from another person bathing (including washing, rinsing, drying)?: A Little Help from another person to put on and taking off regular upper body clothing?: A Little Help from another person to put on and taking off regular lower body clothing?: A Little 6 Click Score: 19    End of Session Equipment Utilized During Treatment: Rolling walker (2 wheels)  OT Visit Diagnosis: Unsteadiness on feet (R26.81);Muscle weakness (generalized) (M62.81)   Activity Tolerance Patient limited by lethargy   Patient Left in chair;with call bell/phone within reach;with chair alarm set   Nurse Communication Mobility status        Time: 4315-4008 OT Time Calculation (min): 29 min  Charges: OT General Charges $OT Visit: 1 Visit OT Treatments $Self Care/Home Management : 8-22 mins  Jackelyn Poling OTR/L, MS Acute Rehabilitation Department Office# [phone] Pager# [phone]   Marcellina Millin 10/27/2021, 1:00 PM

## 2021-10-27 NOTE — Hospital Course (Addendum)
[name] is a 36 yo male with PMH HIV, bipolar disorder, schizoaffective disorder, seizure disorder, HTN, tobacco use, migraine, peripheral neuropathy, PTSD who presented to the ER with complaints of sores on his buttocks.  There was concern for them having become infected. He underwent work-up on admission and was found to be hypotensive requiring vasopressor support in the ICU.  He then became more agitated and restless requiring Precedex and ultimately intubation.  He had improvement and was able to be extubated successfully.  He was transferred to [facility] service on 10/15/2021. He developed signs consistent with catatonia and psychiatry was consulted.  He had not been receiving benzodiazepine during hospitalization which he was on chronically at home prior to admission.  This was resumed on scheduled dosing and his catatonia resolved, he became more awake/alert, and was much more interactive. Unfortunately, due to his behaviors during hospitalization, he was declined from SNF bed offers.  He also declined any home health at discharge. He was strongly encouraged to remain abstinent from alcohol upon discharge.

## 2021-10-27 NOTE — Progress Notes (Signed)
Physical Therapy Treatment Patient Details Name: [name] MRN: [id number] DOB: 07/14/1986 Today's Date: 10/27/2021   History of Present Illness 36 yo male admitted from home with c/o buttock wounds, and dx with FTT and acute encephalopathy.  Pt with hx of HIV, PTSD, schizoaffective d/o, Sz, L hip AVN, neuropathy, polysubstance abuse    PT Comments    Patient agreeable at start of session to mobilize with therapy. Min assist required to pivot to EOB and move supine>sit. Pt completed 2x sit<>stand at EOB with min-Mod assist +2 for power up and safety. Pt c/o slight dizziness on initial stand that improved after sitting back EOB. On second rise pt took several small steps to move bed>chair with +2 Min-Mod assist to steady pt and manage RW position. Pt comfortable in recliner at EOS and repositioned with pillows for LE elevation recliner. RN aware pt in chair and 2+ assist back to bed. Acute PT will continue to progress as able, continue to recommend SNF follow up for rehab to improve independence with mobility.     Recommendations for follow up therapy are one component of a multi-disciplinary discharge planning process, led by the attending physician.  Recommendations may be updated based on patient status, additional functional criteria and insurance authorization.  Follow Up Recommendations  Skilled nursing-short term rehab (<3 hours/day)     Assistance Recommended at Discharge Frequent or constant Supervision/Assistance  Patient can return home with the following A little help with walking and/or transfers;Assistance with cooking/housework;A lot of help with bathing/dressing/bathroom;Help with stairs or ramp for entrance   Equipment Recommendations  None recommended by PT    Recommendations for Other Services OT consult     Precautions / Restrictions Precautions Precautions: Fall;Other (comment) Precaution Comments: rectal tube in place Restrictions Weight Bearing  Restrictions: No Other Position/Activity Restrictions: wounds to bottom     Mobility  Bed Mobility Overal bed mobility: Needs Assistance Bed Mobility: Supine to Sit     Supine to sit: Min assist, HOB elevated     General bed mobility comments: pt required cues for sequencing and increased time to initiate raising trunk. Assist to fully bring LE's off EOB and to pivot hips with bed pad to prevent sheering.    Transfers Overall transfer level: Needs assistance Equipment used: Rolling walker (2 wheels) Transfers: Sit to/from Stand, Bed to chair/wheelchair/BSC Sit to Stand: +2 physical assistance, +2 safety/equipment, Min assist, Mod assist   Step pivot transfers: +2 physical assistance, +2 safety/equipment, Min assist, Mod assist       General transfer comment: Min-Mod +2 with cues for safety to initiate power up from elevated EOB. Min-Mod to manage RW and cue steps to move towards reclineer with +2 for safety.    Ambulation/Gait                   Stairs             Wheelchair Mobility    Modified Rankin (Stroke Patients Only)       Balance Overall balance assessment: Needs assistance Sitting-balance support: No upper extremity supported, Feet supported Sitting balance-Leahy Scale: Good     Standing balance support: Reliant on assistive device for balance Standing balance-Leahy Scale: Fair                              Cognition Arousal/Alertness: Awake/alert Behavior During Therapy: WFL for tasks assessed/performed Overall Cognitive Status: Within Functional Limits for tasks assessed  Exercises      General Comments        Pertinent Vitals/Pain Pain Assessment Pain Assessment: Faces Faces Pain Scale: Hurts a little bit Pain Location: bottom and LE's Pain Descriptors / Indicators: Sore, Discomfort Pain Intervention(s): Limited activity within patient's tolerance,  Monitored during session, Repositioned    Home Living                          Prior Function            PT Goals (current goals can now be found in the care plan section) Acute Rehab PT Goals PT Goal Formulation: With patient Time For Goal Achievement: 11/05/21 Potential to Achieve Goals: Fair Progress towards PT goals: Progressing toward goals    Frequency    Min 3X/week      PT Plan Current plan remains appropriate    Co-evaluation PT/OT/SLP Co-Evaluation/Treatment: Yes Reason for Co-Treatment: For patient/therapist safety;To address functional/ADL transfers PT goals addressed during session: Mobility/safety with mobility;Balance;Proper use of DME OT goals addressed during session: ADL's and self-care;Proper use of Adaptive equipment and DME      AM-PAC PT "6 Clicks" Mobility   Outcome Measure  Help needed turning from your back to your side while in a flat bed without using bedrails?: A Little Help needed moving from lying on your back to sitting on the side of a flat bed without using bedrails?: A Lot Help needed moving to and from a bed to a chair (including a wheelchair)?: A Lot Help needed standing up from a chair using your arms (e.g., wheelchair or bedside chair)?: A Lot Help needed to walk in hospital room?: A Lot Help needed climbing 3-5 steps with a railing? : Total 6 Click Score: 12    End of Session Equipment Utilized During Treatment: Gait belt Activity Tolerance: Patient tolerated treatment well;Patient limited by fatigue Patient left: in chair;with call bell/phone within reach;with nursing/sitter in room Nurse Communication: Mobility status PT Visit Diagnosis: Difficulty in walking, not elsewhere classified (R26.2)     Time: 3361-2244 PT Time Calculation (min) (ACUTE ONLY): 29 min  Charges:  $Therapeutic Activity: 8-22 mins                     Verner Mould, DPT Acute Rehabilitation Services Office [phone] Pager  [phone]    Jacques Navy 10/27/2021, 12:41 PM

## 2021-10-27 NOTE — Progress Notes (Signed)
Progress Note    ROBT OKUDA   YBO:[id number]  DOB: 04/26/1986  DOA: 10/06/2021     20 PCP: [name], MD  Initial CC: Sores on backside  Hospital Course: [name] is a 36 yo male with PMH HIV, bipolar disorder, schizoaffective disorder, seizure disorder, HTN, tobacco use, migraine, peripheral neuropathy, PTSD who presented to the ER with complaints of sores on his buttocks.  There was concern for them having become infected. He underwent work-up on admission and was found to be hypotensive requiring vasopressor support in the ICU.  He then became more agitated and restless requiring Precedex and ultimately intubation.  He had improvement and was able to be extubated successfully.  He was transferred to [facility] service on 10/15/2021.  Interval History:  First-day seeing patient but he was awake, alert and eating lunch by himself.  He was alert and oriented and able to carry on conversation easily. Yesterday there was concern for catatonia due to benzo withdrawal which was restarted and today he appears to be tremendously better.  Assessment & Plan:  Septic shock sec. to E. coli UTI /  LE ulcerations: Patient completed course of antibiotics on 10/15/2021. Sepsis physiology resolved, Pt was to continue midodrine, however pt has been refusing medications.  BP remains well controlled, thus have d/c'd midodrine Foley has since been removed, cont to void well   Acute on chronic metabolic encephalopathy Complicated by bipolar disorder, schizoaffective, EtOH abuse, opioid dependence and PTSD -Patient improved after extubation however was refusing some medications.  He had been off of benzodiazepines and developed catatonia; Ativan was restarted on 10/26/2021 and this morning he is significantly improved and his catatonia seems to be resolved -He will require prolonged taper over months to wean him off benzos -Continue Klonopin 2 mg twice daily and 1 mg twice daily as  needed  Hypoglycemia -still dependent on dextrose containing fluids -Cont to encourage PO as tolerated, liberalize diet as tolerated -Insulin and cpeptide levels are low, proinsulin pending -repeat BMP in am, monitor Na while on D10 - trying to wean D10 as diet improves    Chronic hypotension Completed stress dose steroids. Pt had been refusing midodrine lately, BP remains stable and controlled Have d/c'd midodrine BP remains stable; restart midodrine if MAP<60   Epistaxis: resolved Suspect bleeding from repeated NG tube insertions in context of thrombocytopenia and coagulopathy. Continue aspiration precautions.   Acute on chronic dysphagia: Speech recommended regular diet with nectar thick liquids.   EtOH cirrhosis: Chronic elevated ammonia: - continue lactulose    HIV: Continue Biktarvy.   Mild coagulopathy Suspect due to chronic liver disease.   Chronic anemia / thrombocytopenia Suspected secondary to hx of EtOH abuse. S/p 2 units of platelets and 3 unit PRBC. Hemoglobin has remained stable, Continue to hold anticoagulation given hemoglobin drop this visit Continue SCDs as tolerated   Severe deconditioning Failure to thrive and protein calorie malnutrition Patient was seen by palliative care. Maximize nutrition - follow up dispo plan with CM Cont diet as pt tolerates   Open wound right upper arm Multiple scarring from prior ulceration. Continue Xeroform gauze dressing, Continue tetracycline as tolerated   Hypomagnesemia Replaced   Anasarca -Gross edema throughout -Suspect related to third spacing from malnutrition with albumin <1.5 -Improving with BID IV lasix with good diuresis -d/c lasix and monitor     Old records reviewed in assessment of this patient   DVT prophylaxis: SCD  Code Status:   Code Status: DNR  Disposition Plan:  pending Status is: Inpt  Objective: Blood pressure (!) 86/56, pulse (!) 118, temperature 98.9 F (37.2 C),  temperature source Oral, resp. rate 16, height 6' 3"  (1.905 m), weight 82.6 kg, SpO2 100 %.  Examination:  Physical Exam Constitutional:      General: He is not in acute distress.    Appearance: Normal appearance.     Comments: Chronically ill appearing  HENT:     Head: Normocephalic and atraumatic.     Mouth/Throat:     Mouth: Mucous membranes are moist.  Eyes:     Extraocular Movements: Extraocular movements intact.  Cardiovascular:     Rate and Rhythm: Normal rate and regular rhythm.     Heart sounds: Normal heart sounds.  Pulmonary:     Effort: Pulmonary effort is normal. No respiratory distress.     Breath sounds: Normal breath sounds. No wheezing.  Abdominal:     General: Bowel sounds are normal. There is no distension.     Palpations: Abdomen is soft.     Tenderness: There is no abdominal tenderness.  Musculoskeletal:        General: Normal range of motion.     Cervical back: Normal range of motion and neck supple.  Skin:    General: Skin is warm and dry.  Neurological:     General: No focal deficit present.     Mental Status: He is alert.  Psychiatric:        Mood and Affect: Mood normal.        Behavior: Behavior normal.     Consultants:    Procedures:    Data Reviewed: I have personally reviewed labs and imaging studies    LOS: 20 days  Time spent: Greater than 50% of the 35 minute visit was spent in counseling/coordination of care for the patient as laid out in the A&P.   Dwyane Dee, MD [facility] 10/27/2021, 4:47 PM

## 2021-10-28 LAB — GLUCOSE, CAPILLARY
Glucose-Capillary: 115 mg/dL — ABNORMAL HIGH (ref 70–99)
Glucose-Capillary: 81 mg/dL (ref 70–99)
Glucose-Capillary: 93 mg/dL (ref 70–99)
Glucose-Capillary: 93 mg/dL (ref 70–99)
Glucose-Capillary: 98 mg/dL (ref 70–99)
Glucose-Capillary: 99 mg/dL (ref 70–99)

## 2021-10-28 LAB — CBC WITH DIFFERENTIAL/PLATELET
Abs Immature Granulocytes: 0.1 10*3/uL — ABNORMAL HIGH (ref 0.00–0.07)
Basophils Absolute: 0.1 10*3/uL (ref 0.0–0.1)
Basophils Relative: 1 %
Eosinophils Absolute: 0.2 10*3/uL (ref 0.0–0.5)
Eosinophils Relative: 1 %
HCT: 25.5 % — ABNORMAL LOW (ref 39.0–52.0)
Hemoglobin: 8.3 g/dL — ABNORMAL LOW (ref 13.0–17.0)
Immature Granulocytes: 1 %
Lymphocytes Relative: 23 %
Lymphs Abs: 4.2 10*3/uL — ABNORMAL HIGH (ref 0.7–4.0)
MCH: 31.9 pg (ref 26.0–34.0)
MCHC: 32.5 g/dL (ref 30.0–36.0)
MCV: 98.1 fL (ref 80.0–100.0)
Monocytes Absolute: 1.3 10*3/uL — ABNORMAL HIGH (ref 0.1–1.0)
Monocytes Relative: 7 %
Neutro Abs: 12.3 10*3/uL — ABNORMAL HIGH (ref 1.7–7.7)
Neutrophils Relative %: 67 %
Platelets: 210 10*3/uL (ref 150–400)
RBC: 2.6 MIL/uL — ABNORMAL LOW (ref 4.22–5.81)
RDW: 21.4 % — ABNORMAL HIGH (ref 11.5–15.5)
WBC: 18.2 10*3/uL — ABNORMAL HIGH (ref 4.0–10.5)
nRBC: 0 % (ref 0.0–0.2)

## 2021-10-28 LAB — BASIC METABOLIC PANEL
Anion gap: 9 (ref 5–15)
BUN: 18 mg/dL (ref 6–20)
CO2: 29 mmol/L (ref 22–32)
Calcium: 8.2 mg/dL — ABNORMAL LOW (ref 8.9–10.3)
Chloride: 94 mmol/L — ABNORMAL LOW (ref 98–111)
Creatinine, Ser: 1.3 mg/dL — ABNORMAL HIGH (ref 0.61–1.24)
GFR, Estimated: >60 mL/min (ref >60)
Glucose, Bld: 135 mg/dL — ABNORMAL HIGH (ref 70–99)
Potassium: 3.2 mmol/L — ABNORMAL LOW (ref 3.5–5.1)
Sodium: 132 mmol/L — ABNORMAL LOW (ref 135–145)

## 2021-10-28 LAB — MAGNESIUM: Magnesium: 1.6 mg/dL — ABNORMAL LOW (ref 1.7–2.4)

## 2021-10-28 MED ORDER — MAGNESIUM SULFATE 2 GM/50ML IV SOLN
2.0000 g | Freq: Once | INTRAVENOUS | Status: AC
  Administered 2021-10-28: 2 g via INTRAVENOUS
  Filled 2021-10-28: qty 50

## 2021-10-28 MED ORDER — LACTULOSE 10 GM/15ML PO SOLN
10.0000 g | Freq: Two times a day (BID) | ORAL | Status: DC
  Administered 2021-11-01 – 2021-11-04 (×3): 10 g via ORAL
  Filled 2021-10-28 (×7): qty 15

## 2021-10-28 MED ORDER — POTASSIUM CHLORIDE CRYS ER 20 MEQ PO TBCR
40.0000 meq | EXTENDED_RELEASE_TABLET | Freq: Once | ORAL | Status: AC
  Administered 2021-10-28: 40 meq via ORAL
  Filled 2021-10-28: qty 2

## 2021-10-28 MED ORDER — PANTOPRAZOLE SODIUM 40 MG PO TBEC
40.0000 mg | DELAYED_RELEASE_TABLET | Freq: Every day | ORAL | Status: DC
  Administered 2021-10-29 – 2021-11-04 (×7): 40 mg via ORAL
  Filled 2021-10-28 (×7): qty 1

## 2021-10-28 NOTE — Progress Notes (Signed)
Progress Note    TRAVER MECKES   BTD:[id number]  DOB: 19-May-1986  DOA: 10/06/2021     21 PCP: [name], MD  Initial CC: Sores on backside  Hospital Course: [name] is a 36 yo male with PMH HIV, bipolar disorder, schizoaffective disorder, seizure disorder, HTN, tobacco use, migraine, peripheral neuropathy, PTSD who presented to the ER with complaints of sores on his buttocks.  There was concern for them having become infected. He underwent work-up on admission and was found to be hypotensive requiring vasopressor support in the ICU.  He then became more agitated and restless requiring Precedex and ultimately intubation.  He had improvement and was able to be extubated successfully.  He was transferred to [facility] service on 10/15/2021. He developed signs consistent with catatonia and psychiatry was consulted.  He had not been receiving benzodiazepine during hospitalization which he was on chronically at home prior to admission.  This was resumed on scheduled dosing and his catatonia resolved, he became more awake/alert, and was much more interactive. Unfortunately, due to his behaviors during hospitalization, he was declined from SNF bed offers.  He also declined any home health at discharge. He was strongly encouraged to remain abstinent from alcohol upon discharge although he had already stated he wishes to drink once he gets home.  Interval History:  No events overnight.  He is wanting to go home and we discussed possible discharge today however it appears he is still 2+ assist with physical therapy and he is in no way safe for going home independently especially while refusing home health.  Assessment & Plan:  Severe deconditioning Failure to thrive and severe protein calorie malnutrition - has been followed by palliative care as well -Due to poor behavior during hospitalization, he has been declined by SNF offers; he also has declined home health.  - due to needing 2 person assist  and having no support at home, I do not feel discharge is safe at this time; he will either need to regain more strength with PT in order to return home independently or have 24/7 support (whether that's family or SNF) OR patient will have another expected decompensation and develop a complication from prolonged hospitalization resulting in worse case scenario, death  Acute on chronic metabolic encephalopathy Complicated by bipolar disorder, schizoaffective, EtOH abuse, opioid dependence and PTSD -Patient improved after extubation however was refusing some medications.  He had been off of benzodiazepines and developed catatonia; Ativan was restarted on 10/26/2021 and his catatonia resolved -He will require prolonged taper over months to wean him off benzos -Continue Klonopin 2 mg twice daily on discharge   Septic shock 2/2 E. coli UTI /  LE ulcerations - resolved  Patient completed course of antibiotics on 10/15/2021. Sepsis physiology resolved BP remains well controlled, thus have d/c'd midodrine Foley has since been removed, cont to void well   Hypoglycemia - resolved  -Resolved as diet intake increased - D10 discontinued   Chronic hypotension Completed stress dose steroids. Pt had been refusing midodrine lately, BP remains stable and controlled Have d/c'd midodrine BP remains stable   Epistaxis: resolved Suspect bleeding from repeated NG tube insertions in context of thrombocytopenia and coagulopathy. Continue aspiration precautions.   Acute on chronic dysphagia: Speech recommended regular diet with nectar thick liquids.   Alcoholic cirrhosis Chronic elevated ammonia: - continue lactulose at reduced dose given diarrhea recently    HIV: Continue Biktarvy.   Mild coagulopathy Suspect due to chronic liver disease.   Chronic  anemia / thrombocytopenia Suspected secondary to hx of EtOH abuse. S/p 2 units of platelets and 3 unit PRBC. Hemoglobin has remained stable, Continue to  hold anticoagulation given hemoglobin drop   Open wound right upper arm Multiple scarring from prior ulceration Continue tetracycline as tolerated   Hypomagnesemia Replaced   Anasarca -Gross edema throughout -Suspect related to third spacing from malnutrition with albumin <1.5 -Improving with BID IV lasix with good diuresis -d/c lasix and monitor    Old records reviewed in assessment of this patient   DVT prophylaxis: SCD  Code Status:   Code Status: DNR  Disposition Plan:  pending Status is: Inpt  Objective: Blood pressure (!) 88/62, pulse 100, temperature 98.3 F (36.8 C), temperature source Oral, resp. rate 16, height 6' 3"  (1.905 m), weight 79.1 kg, SpO2 100 %.  Examination:  Physical Exam Constitutional:      General: He is not in acute distress.    Appearance: Normal appearance.     Comments: Chronically ill appearing  HENT:     Head: Normocephalic and atraumatic.     Mouth/Throat:     Mouth: Mucous membranes are moist.  Eyes:     Extraocular Movements: Extraocular movements intact.  Cardiovascular:     Rate and Rhythm: Normal rate and regular rhythm.     Heart sounds: Normal heart sounds.  Pulmonary:     Effort: Pulmonary effort is normal. No respiratory distress.     Breath sounds: Normal breath sounds. No wheezing.  Abdominal:     General: Bowel sounds are normal. There is no distension.     Palpations: Abdomen is soft.     Tenderness: There is no abdominal tenderness.  Musculoskeletal:        General: Normal range of motion.     Cervical back: Normal range of motion and neck supple.  Skin:    General: Skin is warm and dry.  Neurological:     General: No focal deficit present.     Mental Status: He is alert.  Psychiatric:        Mood and Affect: Mood normal.        Behavior: Behavior normal.     Consultants:    Procedures:    Data Reviewed: I have personally reviewed labs and imaging studies    LOS: 21 days  Time spent: Greater than  50% of the 35 minute visit was spent in counseling/coordination of care for the patient as laid out in the A&P.   Dwyane Dee, MD [facility] 10/28/2021, 4:47 PM

## 2021-10-28 NOTE — TOC Progression Note (Signed)
Transition of Care [facility]) - Progression Note    Patient Details  Name: [name] MRN: [id number] Date of Birth: 01-Apr-1986  Transition of Care [facility]) CM/SW Contact  Scot Shiraishi, Marjie Skiff, RN Phone Number: 10/28/2021, 2:28 PM  Clinical Narrative:    Spoke with pt at bedside for dc planning. Pt states that he wants to go home at dc and that he has an aide through his Medicaid that can assist him when he gets home. Pt is alert and oriented at this time. When asked if pt wants home health PT he says no.   Spoke with pt mother Luellen Pucker to inform her that pt wants to go home. She states that she cannot help him at home because "my knees are bad". She says that she lives in [place] and the pt lives in an apartment in [place]. She gave the name and phone number of the pt's aide that helps him at home. Verdene Lennert [phone]. Luellen Pucker was informed that pt has no SNF bed offers due to his behavior issues and that we do not have another dc plan for him at this time. Luellen Pucker was encouraged to talk to the pt brother and Asim to discuss how they can support him at home at dc.         Social Determinants of Health (SDOH) Interventions    Readmission Risk Interventions Readmission Risk Prevention Plan 08/24/2021 08/24/2021 11/01/2019  Transportation Screening Complete - -  PCP or Specialist Appt within 3-5 Days Complete Complete -  Home Care Screening - - Complete  Medication Review (RN CM) - - Complete  HRI or Home Care Consult Complete Complete -  Social Work Consult for [place] Planning/Counseling - Complete -  Palliative Care Screening Complete Complete -  Medication Review Press photographer) Complete Complete -  Some recent data might be hidden

## 2021-10-28 NOTE — Progress Notes (Signed)
Patient refused all night time medications. Patient stated that he is ready to go. Two other nurses also tried but patient still refused. Explained and informed patient if he change his mind to let this RN know.

## 2021-10-28 NOTE — Progress Notes (Signed)
Occupational Therapy Treatment Patient Details Name: [name] MRN: [id number] DOB: 04/06/1986 Today's Date: 10/28/2021   History of present illness 36 yo male admitted from home with c/o buttock wounds, and dx with FTT and acute encephalopathy.  Pt with hx of HIV, PTSD, schizoaffective d/o, Sz, L hip AVN, neuropathy, polysubstance abuse   OT comments  Patient initially cooperative with OT. Mod I for sitting up to edge of bed and min A for safety when powering up to standing with bed height elevated. Upon standing note patient incontinent of bowel, strongly encourage patient to transfer to bedside commode or stay in standing to perform perianal care however patient adamantly refusing. Ultimately patient min A to stand pivot with walker to recliner and did not allow OT to wash buttock despite education on skin breakdown. Patient state "I know I don't care." Notified RN.    Recommendations for follow up therapy are one component of a multi-disciplinary discharge planning process, led by the attending physician.  Recommendations may be updated based on patient status, additional functional criteria and insurance authorization.    Follow Up Recommendations  No OT follow up    Assistance Recommended at Discharge Intermittent Supervision/Assistance  Patient can return home with the following  A little help with walking and/or transfers;A little help with bathing/dressing/bathroom;Assistance with cooking/housework;Assist for transportation   Equipment Recommendations  None recommended by OT       Precautions / Restrictions Precautions Precautions: Fall Restrictions Weight Bearing Restrictions: No Other Position/Activity Restrictions: wounds to bottom       Mobility Bed Mobility Overal bed mobility: Modified Independent                     Balance Overall balance assessment: Needs assistance Sitting-balance support: Feet supported Sitting balance-Leahy Scale: Good      Standing balance support: Reliant on assistive device for balance Standing balance-Leahy Scale: Poor                             ADL either performed or assessed with clinical judgement   ADL Overall ADL's : Needs assistance/impaired                         Toilet Transfer: Minimal assistance;Cueing for safety;Stand-pivot;Rolling walker (2 wheels) Toilet Transfer Details (indicate cue type and reason): Bed height elevated to power up to standing from edge of bed. Verbal cues to keep LEs within frame of walker when turning to sit into recliner chair.   Toileting - Clothing Manipulation Details (indicate cue type and reason): Patient adamantly refusing for pericare to be performed after incontinent of stool in bed     Functional mobility during ADLs: Minimal assistance;Cueing for safety;Rolling walker (2 wheels)        Cognition Arousal/Alertness: Awake/alert Behavior During Therapy: WFL for tasks assessed/performed Overall Cognitive Status: Within Functional Limits for tasks assessed                                 General Comments: Patient initially appropriate and participatory however when informed he had a BM and should get cleaned up patient repeatedly refusing                   Pertinent Vitals/ Pain       Pain Assessment Pain Assessment: Faces Faces Pain Scale: No hurt  Frequency  Min 2X/week        Progress Toward Goals  OT Goals(current goals can now be found in the care plan section)  Progress towards OT goals: Progressing toward goals  Acute Rehab OT Goals Patient Stated Goal: Home today OT Goal Formulation: With patient Time For Goal Achievement: 11/06/21 Potential to Achieve Goals: Good ADL Goals Pt Will Perform Grooming: with supervision;standing Pt Will Perform Lower Body Bathing: sit to/from stand;with supervision Pt Will Perform Lower Body Dressing: with supervision;sit to/from stand Pt Will  Transfer to Toilet: with supervision;ambulating;regular height toilet Pt/caregiver will Perform Home Exercise Program: Increased strength;Both right and left upper extremity;With theraband;With Supervision;With written HEP provided  Plan Discharge plan remains appropriate       AM-PAC OT "6 Clicks" Daily Activity     Outcome Measure   Help from another person eating meals?: None Help from another person taking care of personal grooming?: A Little Help from another person toileting, which includes using toliet, bedpan, or urinal?: A Little Help from another person bathing (including washing, rinsing, drying)?: A Little Help from another person to put on and taking off regular upper body clothing?: A Little Help from another person to put on and taking off regular lower body clothing?: A Little 6 Click Score: 19    End of Session Equipment Utilized During Treatment: Rolling walker (2 wheels)  OT Visit Diagnosis: Unsteadiness on feet (R26.81);Muscle weakness (generalized) (M62.81)   Activity Tolerance Patient tolerated treatment well   Patient Left in chair;with call bell/phone within reach;with chair alarm set   Nurse Communication Mobility status;Other (comment) (soiled bottom)        Time: 0762-2633 OT Time Calculation (min): 19 min  Charges: OT General Charges $OT Visit: 1 Visit OT Treatments $Self Care/Home Management : 8-22 mins  Delbert Phenix OT OT pager: [place] 10/28/2021, 1:34 PM

## 2021-10-29 LAB — CBC WITH DIFFERENTIAL/PLATELET
Abs Immature Granulocytes: 0.08 10*3/uL — ABNORMAL HIGH (ref 0.00–0.07)
Basophils Absolute: 0.1 10*3/uL (ref 0.0–0.1)
Basophils Relative: 0 %
Eosinophils Absolute: 0.3 10*3/uL (ref 0.0–0.5)
Eosinophils Relative: 2 %
HCT: 24.8 % — ABNORMAL LOW (ref 39.0–52.0)
Hemoglobin: 7.8 g/dL — ABNORMAL LOW (ref 13.0–17.0)
Immature Granulocytes: 1 %
Lymphocytes Relative: 23 %
Lymphs Abs: 3.5 10*3/uL (ref 0.7–4.0)
MCH: 31.6 pg (ref 26.0–34.0)
MCHC: 31.5 g/dL (ref 30.0–36.0)
MCV: 100.4 fL — ABNORMAL HIGH (ref 80.0–100.0)
Monocytes Absolute: 1 10*3/uL (ref 0.1–1.0)
Monocytes Relative: 7 %
Neutro Abs: 10.1 10*3/uL — ABNORMAL HIGH (ref 1.7–7.7)
Neutrophils Relative %: 67 %
Platelets: 213 10*3/uL (ref 150–400)
RBC: 2.47 MIL/uL — ABNORMAL LOW (ref 4.22–5.81)
RDW: 21.2 % — ABNORMAL HIGH (ref 11.5–15.5)
WBC: 15.1 10*3/uL — ABNORMAL HIGH (ref 4.0–10.5)
nRBC: 0 % (ref 0.0–0.2)

## 2021-10-29 LAB — BASIC METABOLIC PANEL
Anion gap: 8 (ref 5–15)
BUN: 19 mg/dL (ref 6–20)
CO2: 27 mmol/L (ref 22–32)
Calcium: 8 mg/dL — ABNORMAL LOW (ref 8.9–10.3)
Chloride: 99 mmol/L (ref 98–111)
Creatinine, Ser: 1.49 mg/dL — ABNORMAL HIGH (ref 0.61–1.24)
GFR, Estimated: >60 mL/min (ref >60)
Glucose, Bld: 110 mg/dL — ABNORMAL HIGH (ref 70–99)
Potassium: 3.3 mmol/L — ABNORMAL LOW (ref 3.5–5.1)
Sodium: 134 mmol/L — ABNORMAL LOW (ref 135–145)

## 2021-10-29 LAB — GLUCOSE, CAPILLARY
Glucose-Capillary: 100 mg/dL — ABNORMAL HIGH (ref 70–99)
Glucose-Capillary: 107 mg/dL — ABNORMAL HIGH (ref 70–99)
Glucose-Capillary: 109 mg/dL — ABNORMAL HIGH (ref 70–99)
Glucose-Capillary: 83 mg/dL (ref 70–99)
Glucose-Capillary: 85 mg/dL (ref 70–99)

## 2021-10-29 LAB — MAGNESIUM: Magnesium: 2.1 mg/dL (ref 1.7–2.4)

## 2021-10-29 NOTE — Progress Notes (Signed)
Progress Note    [name]   HUD:[id number]  DOB: 08-Mar-1986  DOA: 10/06/2021     22 PCP: [name], MD  Initial CC: Sores on backside  Hospital Course: [name] is a 36 yo male with PMH HIV, bipolar disorder, schizoaffective disorder, seizure disorder, HTN, tobacco use, migraine, peripheral neuropathy, PTSD who presented to the ER with complaints of sores on his buttocks.  There was concern for them having become infected. He underwent work-up on admission and was found to be hypotensive requiring vasopressor support in the ICU.  He then became more agitated and restless requiring Precedex and ultimately intubation.  He had improvement and was able to be extubated successfully.  He was transferred to [facility] service on 10/15/2021. He developed signs consistent with catatonia and psychiatry was consulted.  He had not been receiving benzodiazepine during hospitalization which he was on chronically at home prior to admission.  This was resumed on scheduled dosing and his catatonia resolved, he became more awake/alert, and was much more interactive. Unfortunately, due to his behaviors during hospitalization, he was declined from SNF bed offers.  He also declined any home health at discharge. He was strongly encouraged to remain abstinent from alcohol upon discharge.  Interval History:  No events overnight.  I informed him he would not be safe for discharging home until he was much more independent and able to ambulate essentially independently.  Assessment & Plan:  Severe deconditioning Failure to thrive and severe protein calorie malnutrition - has been followed by palliative care as well -Due to poor behavior during hospitalization, he has been declined by SNF offers; he also has declined home health.  - due to needing 2 person assist and having no support at home, I do not feel discharge is safe at this time; he will either need to regain more strength with PT in order to return  home independently or have 24/7 support (whether that's family or SNF) OR patient will have another expected decompensation and develop a complication from prolonged hospitalization resulting in worse case scenario, death  Acute on chronic metabolic encephalopathy Complicated by bipolar disorder, schizoaffective, EtOH abuse, opioid dependence and PTSD -Patient improved after extubation however was refusing some medications.  He had been off of benzodiazepines and developed catatonia; Ativan was restarted on 10/26/2021 and his catatonia resolved -He will require prolonged taper over months to wean him off benzos -Continue Klonopin 2 mg twice daily on discharge   Chronic hypotension - Completed stress dose steroids. Midodrine discontinued as well - expected in setting of cirrhosis  Alcoholic cirrhosis Chronic elevated ammonia - continue lactulose at reduced dose given diarrhea recently   Septic shock 2/2 E. coli UTI /  LE ulcerations - resolved  Patient completed course of antibiotics on 10/15/2021.   Hypoglycemia - resolved  -Resolved as diet intake increased - D10 discontinued   Epistaxis: resolved Suspect bleeding from repeated NG tube insertions in context of thrombocytopenia and coagulopathy. Continue aspiration precautions.   Acute on chronic dysphagia: Speech recommended regular diet with nectar thick liquids.   HIV: Continue Biktarvy.   Mild coagulopathy Suspect due to chronic liver disease.   Chronic anemia / thrombocytopenia Suspected secondary to hx of EtOH abuse. S/p 2 units of platelets and 3 unit PRBC. Hemoglobin has remained stable, Continue to hold anticoagulation given hemoglobin drop   Open wound right upper arm Multiple scarring from prior ulceration Continue tetracycline as tolerated   Hypomagnesemia Replaced   Anasarca -Gross edema throughout -Suspect  related to third spacing from malnutrition with albumin <1.5 -Improving with BID IV lasix with good  diuresis -d/c lasix and monitor    Old records reviewed in assessment of this patient   DVT prophylaxis: SCD  Code Status:   Code Status: DNR  Disposition Plan:  pending clinical improvement  Status is: Inpt  Objective: Blood pressure (!) 96/58, pulse 98, temperature 97.6 F (36.4 C), temperature source Oral, resp. rate 18, height 6' 3"  (1.905 m), weight 79.1 kg, SpO2 97 %.  Examination:  Physical Exam Constitutional:      General: He is not in acute distress.    Appearance: Normal appearance.     Comments: Chronically ill appearing  HENT:     Head: Normocephalic and atraumatic.     Mouth/Throat:     Mouth: Mucous membranes are moist.  Eyes:     Extraocular Movements: Extraocular movements intact.  Cardiovascular:     Rate and Rhythm: Normal rate and regular rhythm.     Heart sounds: Normal heart sounds.  Pulmonary:     Effort: Pulmonary effort is normal. No respiratory distress.     Breath sounds: Normal breath sounds. No wheezing.  Abdominal:     General: Bowel sounds are normal. There is no distension.     Palpations: Abdomen is soft.     Tenderness: There is no abdominal tenderness.  Musculoskeletal:        General: Normal range of motion.     Cervical back: Normal range of motion and neck supple.  Skin:    General: Skin is warm and dry.  Neurological:     General: No focal deficit present.     Mental Status: He is alert.  Psychiatric:        Mood and Affect: Mood normal.        Behavior: Behavior normal.     Consultants:    Procedures:    Data Reviewed: I have personally reviewed labs and imaging studies    LOS: 22 days  Time spent: Greater than 50% of the 35 minute visit was spent in counseling/coordination of care for the patient as laid out in the A&P.   Dwyane Dee, MD [facility] 10/29/2021, 2:12 PM

## 2021-10-29 NOTE — Progress Notes (Signed)
SLP Cancellation Note  Patient Details Name: [name] MRN: [id number] DOB: August 16, 1986   Cancelled treatment:       Reason Eval/Treat Not Completed: Other (comment) (SLP discussion with RN 2 days ago and po doc, pt appeared to be tolerating modified diet.)  Will follow up next week - repeat MBS may be helpful given prolonged hospital coarse and silent nature of dysphagia to see if pt may be appropriate for dietary advancement.   [name] 10/29/2021, 4:28 PM

## 2021-10-29 NOTE — Progress Notes (Signed)
Patient refused dressing change this morning.

## 2021-10-29 NOTE — Care Management Important Message (Signed)
Important Message  Patient Details IM Letter placed in Patients room. Name: ALESSANDRO GRIEP MRN: [id number] Date of Birth: 09/07/1986   Medicare Important Message Given:  Yes     Kerin Salen 10/29/2021, 9:58 AM

## 2021-10-29 NOTE — Progress Notes (Signed)
Physical Therapy Treatment Patient Details Name: [name] MRN: [id number] DOB: Sep 11, 1986 Today's Date: 10/29/2021   History of Present Illness 36 yo male admitted from home with c/o buttock wounds, and dx with FTT and acute encephalopathy.  Pt with hx of HIV, PTSD, schizoaffective d/o, Sz, L hip AVN, neuropathy, polysubstance abuse    PT Comments    Pt able to perform OOB to recliner min/guard today.  Pt appears to change his mind often and prefers to perform at his own pace.   Recommendations for follow up therapy are one component of a multi-disciplinary discharge planning process, led by the attending physician.  Recommendations may be updated based on patient status, additional functional criteria and insurance authorization.  Follow Up Recommendations  Skilled nursing-short term rehab (<3 hours/day)     Assistance Recommended at Discharge Frequent or constant Supervision/Assistance  Patient can return home with the following A little help with walking and/or transfers;Assistance with cooking/housework;A lot of help with bathing/dressing/bathroom;Help with stairs or ramp for entrance   Equipment Recommendations  None recommended by PT    Recommendations for Other Services       Precautions / Restrictions Precautions Precautions: Fall Restrictions Weight Bearing Restrictions: No     Mobility  Bed Mobility Overal bed mobility: Modified Independent             General bed mobility comments: increased time    Transfers Overall transfer level: Needs assistance Equipment used: Rolling walker (2 wheels) Transfers: Sit to/from Stand, Bed to chair/wheelchair/BSC Sit to Stand: Min guard   Step pivot transfers: Min guard       General transfer comment: pt requested to have BM and use BSC however once standing, pt with step pivot over to recliner instead of BSC, min/guard for safety    Ambulation/Gait                   Stairs              Wheelchair Mobility    Modified Rankin (Stroke Patients Only)       Balance                                            Cognition Arousal/Alertness: Awake/alert Behavior During Therapy: WFL for tasks assessed/performed Overall Cognitive Status: No family/caregiver present to determine baseline cognitive functioning                                 General Comments: pt requesting one thing and then when prepared to occur, pt would perform or ask something else and state no to his previous request, follows simple commands with increased time        Exercises      General Comments        Pertinent Vitals/Pain Pain Assessment Pain Assessment: Faces Faces Pain Scale: No hurt Pain Intervention(s): Repositioned, Monitored during session    Home Living                          Prior Function            PT Goals (current goals can now be found in the care plan section) Progress towards PT goals: Progressing toward goals    Frequency    Min 3X/week  PT Plan Current plan remains appropriate    Co-evaluation              AM-PAC PT "6 Clicks" Mobility   Outcome Measure  Help needed turning from your back to your side while in a flat bed without using bedrails?: A Little Help needed moving from lying on your back to sitting on the side of a flat bed without using bedrails?: A Little Help needed moving to and from a bed to a chair (including a wheelchair)?: A Little Help needed standing up from a chair using your arms (e.g., wheelchair or bedside chair)?: A Lot Help needed to walk in hospital room?: A Lot Help needed climbing 3-5 steps with a railing? : Total 6 Click Score: 14    End of Session Equipment Utilized During Treatment: Gait belt Activity Tolerance: Patient tolerated treatment well Patient left: in chair;with call bell/phone within reach;with chair alarm set Nurse Communication: Mobility  status PT Visit Diagnosis: Difficulty in walking, not elsewhere classified (R26.2)     Time: 1411-1431 PT Time Calculation (min) (ACUTE ONLY): 20 min  Charges:  $Therapeutic Activity: 8-22 mins                    Jannette Spanner PT, DPT Acute Rehabilitation Services Pager: [phone] Office: [place] 10/29/2021, 3:53 PM

## 2021-10-30 DIAGNOSIS — K7682 Hepatic encephalopathy: Secondary | ICD-10-CM

## 2021-10-30 LAB — CBC WITH DIFFERENTIAL/PLATELET
Abs Immature Granulocytes: 0.1 10*3/uL — ABNORMAL HIGH (ref 0.00–0.07)
Basophils Absolute: 0.1 10*3/uL (ref 0.0–0.1)
Basophils Relative: 0 %
Eosinophils Absolute: 0.3 10*3/uL (ref 0.0–0.5)
Eosinophils Relative: 2 %
HCT: 24 % — ABNORMAL LOW (ref 39.0–52.0)
Hemoglobin: 7.5 g/dL — ABNORMAL LOW (ref 13.0–17.0)
Immature Granulocytes: 1 %
Lymphocytes Relative: 22 %
Lymphs Abs: 3.4 10*3/uL (ref 0.7–4.0)
MCH: 31.6 pg (ref 26.0–34.0)
MCHC: 31.3 g/dL (ref 30.0–36.0)
MCV: 101.3 fL — ABNORMAL HIGH (ref 80.0–100.0)
Monocytes Absolute: 1.1 10*3/uL — ABNORMAL HIGH (ref 0.1–1.0)
Monocytes Relative: 7 %
Neutro Abs: 10.7 10*3/uL — ABNORMAL HIGH (ref 1.7–7.7)
Neutrophils Relative %: 68 %
Platelets: 215 10*3/uL (ref 150–400)
RBC: 2.37 MIL/uL — ABNORMAL LOW (ref 4.22–5.81)
RDW: 21 % — ABNORMAL HIGH (ref 11.5–15.5)
WBC: 15.7 10*3/uL — ABNORMAL HIGH (ref 4.0–10.5)
nRBC: 0 % (ref 0.0–0.2)

## 2021-10-30 LAB — GLUCOSE, CAPILLARY
Glucose-Capillary: 117 mg/dL — ABNORMAL HIGH (ref 70–99)
Glucose-Capillary: 84 mg/dL (ref 70–99)
Glucose-Capillary: 89 mg/dL (ref 70–99)
Glucose-Capillary: 89 mg/dL (ref 70–99)
Glucose-Capillary: 90 mg/dL (ref 70–99)

## 2021-10-30 LAB — BASIC METABOLIC PANEL
Anion gap: 6 (ref 5–15)
BUN: 17 mg/dL (ref 6–20)
CO2: 30 mmol/L (ref 22–32)
Calcium: 8.2 mg/dL — ABNORMAL LOW (ref 8.9–10.3)
Chloride: 102 mmol/L (ref 98–111)
Creatinine, Ser: 1.2 mg/dL (ref 0.61–1.24)
GFR, Estimated: >60 mL/min (ref >60)
Glucose, Bld: 89 mg/dL (ref 70–99)
Potassium: 3.3 mmol/L — ABNORMAL LOW (ref 3.5–5.1)
Sodium: 138 mmol/L (ref 135–145)

## 2021-10-30 LAB — MAGNESIUM: Magnesium: 1.9 mg/dL (ref 1.7–2.4)

## 2021-10-30 MED ORDER — POTASSIUM CHLORIDE CRYS ER 20 MEQ PO TBCR
40.0000 meq | EXTENDED_RELEASE_TABLET | Freq: Once | ORAL | Status: AC
  Administered 2021-10-30: 40 meq via ORAL
  Filled 2021-10-30: qty 2

## 2021-10-30 NOTE — Progress Notes (Signed)
Progress Note    [name]   [id number]:[id number]  DOB: 20-Aug-1986  DOA: 10/06/2021     23 PCP: [name], MD  Initial CC: Sores on backside  Hospital Course: [name] is a 36 yo male with PMH HIV, bipolar disorder, schizoaffective disorder, seizure disorder, HTN, tobacco use, migraine, peripheral neuropathy, PTSD who presented to the ER with complaints of sores on his buttocks.  There was concern for them having become infected. He underwent work-up on admission and was found to be hypotensive requiring vasopressor support in the ICU.  He then became more agitated and restless requiring Precedex and ultimately intubation.  He had improvement and was able to be extubated successfully.  He was transferred to [facility] service on 10/15/2021. He developed signs consistent with catatonia and psychiatry was consulted.  He had not been receiving benzodiazepine during hospitalization which he was on chronically at home prior to admission.  This was resumed on scheduled dosing and his catatonia resolved, he became more awake/alert, and was much more interactive. Unfortunately, due to his behaviors during hospitalization, he was declined from SNF bed offers.  He also declined any home health at discharge. He was strongly encouraged to remain abstinent from alcohol upon discharge.  Interval History:  No events overnight.  Worked a little better with therapy yesterday.  Had to repeat my questions several times today in order for him to understand and respond.  Assessment & Plan:  Severe deconditioning Failure to thrive and severe protein calorie malnutrition - has been followed by palliative care as well -Due to poor behavior during hospitalization, he has been declined by SNF offers; he also has declined home health.  - due to needing 2 person assist and having no support at home, I do not feel discharge is safe at this time; he will either need to regain more strength with PT in order to return  home independently or have 24/7 support (whether that's family or SNF) OR patient will have another expected decompensation and develop a complication from prolonged hospitalization resulting in worse case scenario, death  Acute on chronic metabolic encephalopathy Complicated by bipolar disorder, schizoaffective, EtOH abuse, opioid dependence and PTSD -Patient improved after extubation however was refusing some medications.  He had been off of benzodiazepines and developed catatonia; Ativan was restarted on 10/26/2021 and his catatonia resolved -He will require prolonged taper over months to wean him off benzos -Continue Klonopin 2 mg twice daily on discharge   Chronic hypotension - Completed stress dose steroids. Midodrine discontinued as well - expected in setting of cirrhosis  Alcoholic cirrhosis Chronic elevated ammonia - continue lactulose at reduced dose given diarrhea recently (seems to have improved); will adjust as needed  Septic shock 2/2 E. coli UTI /  LE ulcerations - resolved  Patient completed course of antibiotics on 10/15/2021.   Hypoglycemia - resolved  -Resolved as diet intake increased - D10 discontinued   Epistaxis: resolved Suspect bleeding from repeated NG tube insertions in context of thrombocytopenia and coagulopathy. Continue aspiration precautions.   Acute on chronic dysphagia: Speech recommended regular diet with nectar thick liquids.   HIV: Continue Biktarvy.   Mild coagulopathy Suspect due to chronic liver disease.   Chronic anemia / thrombocytopenia Suspected secondary to hx of EtOH abuse and cirrhosis S/p 2 units of platelets and 3 unit PRBC. Hemoglobin has remained stable, Continue to hold anticoagulation given hemoglobin drop   Open wound right upper arm Multiple scarring from prior ulceration Continue tetracycline as tolerated  Hypomagnesemia Replaced   Anasarca -Gross edema throughout -Suspect related to third spacing from  malnutrition with albumin <1.5 -Improving with BID IV lasix with good diuresis -d/c lasix and monitor    Old records reviewed in assessment of this patient   DVT prophylaxis: SCD  Code Status:   Code Status: DNR  Disposition Plan:  pending clinical improvement, goal is home when more mobile/independent  Status is: Inpt  Objective: Blood pressure 103/64, pulse (!) 103, temperature 98.2 F (36.8 C), temperature source Oral, resp. rate 17, height 6' 3"  (1.905 m), weight 81.6 kg, SpO2 94 %.  Examination:  Physical Exam Constitutional:      General: He is not in acute distress.    Appearance: Normal appearance.     Comments: Chronically ill appearing  HENT:     Head: Normocephalic and atraumatic.     Mouth/Throat:     Mouth: Mucous membranes are moist.  Eyes:     Extraocular Movements: Extraocular movements intact.  Cardiovascular:     Rate and Rhythm: Normal rate and regular rhythm.     Heart sounds: Normal heart sounds.  Pulmonary:     Effort: Pulmonary effort is normal. No respiratory distress.     Breath sounds: Normal breath sounds. No wheezing.  Abdominal:     General: Bowel sounds are normal. There is no distension.     Palpations: Abdomen is soft.     Tenderness: There is no abdominal tenderness.  Musculoskeletal:        General: Normal range of motion.     Cervical back: Normal range of motion and neck supple.  Skin:    General: Skin is warm and dry.  Neurological:     General: No focal deficit present.     Mental Status: He is alert.  Psychiatric:        Mood and Affect: Mood normal.        Behavior: Behavior normal.     Consultants:    Procedures:    Data Reviewed: I have personally reviewed labs and imaging studies    LOS: 23 days  Time spent: Greater than 50% of the 35 minute visit was spent in counseling/coordination of care for the patient as laid out in the A&P.   Drew Dee, MD [facility] 10/30/2021, 1:04 PM

## 2021-10-31 LAB — GLUCOSE, CAPILLARY
Glucose-Capillary: 76 mg/dL (ref 70–99)
Glucose-Capillary: 66 mg/dL — ABNORMAL LOW (ref 70–99)
Glucose-Capillary: 96 mg/dL (ref 70–99)
Glucose-Capillary: 117 mg/dL — ABNORMAL HIGH (ref 70–99)
Glucose-Capillary: 114 mg/dL — ABNORMAL HIGH (ref 70–99)

## 2021-10-31 LAB — CBC WITH DIFFERENTIAL/PLATELET
Abs Immature Granulocytes: 0.06 10*3/uL (ref 0.00–0.07)
Basophils Absolute: 0.1 10*3/uL (ref 0.0–0.1)
Basophils Relative: 0 %
Eosinophils Absolute: 0.3 10*3/uL (ref 0.0–0.5)
Eosinophils Relative: 2 %
HCT: 23.8 % — ABNORMAL LOW (ref 39.0–52.0)
Hemoglobin: 7.5 g/dL — ABNORMAL LOW (ref 13.0–17.0)
Immature Granulocytes: 1 %
Lymphocytes Relative: 25 %
Lymphs Abs: 2.9 10*3/uL (ref 0.7–4.0)
MCH: 31.9 pg (ref 26.0–34.0)
MCHC: 31.5 g/dL (ref 30.0–36.0)
MCV: 101.3 fL — ABNORMAL HIGH (ref 80.0–100.0)
Monocytes Absolute: 0.8 10*3/uL (ref 0.1–1.0)
Monocytes Relative: 7 %
Neutro Abs: 7.6 10*3/uL (ref 1.7–7.7)
Neutrophils Relative %: 65 %
Platelets: 218 10*3/uL (ref 150–400)
RBC: 2.35 MIL/uL — ABNORMAL LOW (ref 4.22–5.81)
RDW: 21 % — ABNORMAL HIGH (ref 11.5–15.5)
WBC: 11.7 10*3/uL — ABNORMAL HIGH (ref 4.0–10.5)
nRBC: 0 % (ref 0.0–0.2)

## 2021-10-31 LAB — BASIC METABOLIC PANEL
Anion gap: 5 (ref 5–15)
BUN: 17 mg/dL (ref 6–20)
CO2: 29 mmol/L (ref 22–32)
Calcium: 8.4 mg/dL — ABNORMAL LOW (ref 8.9–10.3)
Chloride: 102 mmol/L (ref 98–111)
Creatinine, Ser: 1.07 mg/dL (ref 0.61–1.24)
GFR, Estimated: >60 mL/min (ref >60)
Glucose, Bld: 84 mg/dL (ref 70–99)
Potassium: 3.7 mmol/L (ref 3.5–5.1)
Sodium: 136 mmol/L (ref 135–145)

## 2021-10-31 LAB — MAGNESIUM: Magnesium: 1.8 mg/dL (ref 1.7–2.4)

## 2021-10-31 NOTE — Progress Notes (Signed)
Progress Note    [name]   [id number]  DOB: [birth year]  DOA: 10/06/2021     24 PCP: [name], MD  Initial CC: Sores on backside  Hospital Course: [name] is a 36 yo male with PMH HIV, bipolar disorder, schizoaffective disorder, seizure disorder, HTN, tobacco use, migraine, peripheral neuropathy, PTSD who presented to the ER with complaints of sores on his buttocks.  There was concern for them having become infected. He underwent work-up on admission and was found to be hypotensive requiring vasopressor support in the ICU.  He then became more agitated and restless requiring Precedex and ultimately intubation.  He had improvement and was able to be extubated successfully.  He was transferred to [facility] service on 10/15/2021. He developed signs consistent with catatonia and psychiatry was consulted.  He had not been receiving benzodiazepine during hospitalization which he was on chronically at home prior to admission.  This was resumed on scheduled dosing and his catatonia resolved, he became more awake/alert, and was much more interactive. Unfortunately, due to his behaviors during hospitalization, he was declined from SNF bed offers.  He also declined any home health at discharge. He was strongly encouraged to remain abstinent from alcohol upon discharge.  Interval History:  No events overnight. Remains slightly confused at baseline and can't answer open ended questions at times. But otherwise when we talk about him going home he can engage fairly well in that conversation; and again I've told him he needs to get stronger before being able to return home.   Assessment & Plan:  Severe deconditioning Failure to thrive and severe protein calorie malnutrition - has been followed by palliative care as well -Due to poor behavior during hospitalization, he has been declined by SNF offers; he also has declined home health.  - continue working with PT; he's still showing some  progression and goal is to be able to have him be more mobile independently before him returning home   Acute on chronic metabolic encephalopathy Complicated by bipolar disorder, schizoaffective, EtOH abuse, opioid dependence and PTSD -Patient improved after extubation however was refusing some medications.  He had been off of benzodiazepines and developed catatonia; Ativan was restarted on 10/26/2021 and his catatonia resolved -He will require prolonged taper over months to wean him off benzos -Continue Klonopin 2 mg twice daily on discharge   Chronic hypotension - Completed stress dose steroids. Midodrine discontinued as well - expected in setting of cirrhosis  Alcoholic cirrhosis Chronic elevated ammonia - continue lactulose at reduced dose given diarrhea recently (seems to have improved); will adjust as needed  Septic shock 2/2 E. coli UTI /  LE ulcerations - resolved  Patient completed course of antibiotics on 10/15/2021.   Hypoglycemia - resolved  -Resolved as diet intake increased - D10 discontinued   Epistaxis: resolved Suspect bleeding from repeated NG tube insertions in context of thrombocytopenia and coagulopathy. Continue aspiration precautions.   Acute on chronic dysphagia: Speech recommended regular diet with nectar thick liquids.   HIV: Continue Biktarvy   Chronic anemia / thrombocytopenia Mild coagulopathy Suspected secondary to hx of EtOH abuse and cirrhosis S/p 2 units of platelets and 3 unit PRBC. Hemoglobin has remained stable, Continue to hold anticoagulation given hemoglobin drop   Open wound right upper arm Multiple scarring from prior ulceration Continue tetracycline as tolerated   Hypomagnesemia Replaced   Anasarca -Gross edema throughout -Suspect related to third spacing from malnutrition with albumin <1.5 -Improving with BID IV lasix with good  diuresis -d/c lasix and monitor    Old records reviewed in assessment of this patient   DVT  prophylaxis: SCD  Code Status:   Code Status: DNR  Disposition Plan:  pending clinical improvement, goal is home when more mobile/independent  Status is: Inpt  Objective: Blood pressure (!) 95/59, pulse (!) 111, temperature 98.3 F (36.8 C), temperature source Oral, resp. rate 16, height 6' 3"  (1.905 m), weight 80.6 kg, SpO2 100 %.  Examination:  Physical Exam Constitutional:      General: He is not in acute distress.    Appearance: Normal appearance.     Comments: Chronically ill appearing  HENT:     Head: Normocephalic and atraumatic.     Mouth/Throat:     Mouth: Mucous membranes are moist.  Eyes:     Extraocular Movements: Extraocular movements intact.  Cardiovascular:     Rate and Rhythm: Normal rate and regular rhythm.     Heart sounds: Normal heart sounds.  Pulmonary:     Effort: Pulmonary effort is normal. No respiratory distress.     Breath sounds: Normal breath sounds. No wheezing.  Abdominal:     General: Bowel sounds are normal. There is no distension.     Palpations: Abdomen is soft.     Tenderness: There is no abdominal tenderness.  Musculoskeletal:        General: Normal range of motion.     Cervical back: Normal range of motion and neck supple.  Skin:    General: Skin is warm and dry.  Neurological:     General: No focal deficit present.     Mental Status: He is alert.  Psychiatric:        Mood and Affect: Mood normal.        Behavior: Behavior normal.     Consultants:    Procedures:    Data Reviewed: I have personally reviewed labs and imaging studies    LOS: 24 days  Time spent: Greater than 50% of the 35 minute visit was spent in counseling/coordination of care for the patient as laid out in the A&P.   Drew Dee, MD [facility] 10/31/2021, 3:05 PM

## 2021-11-01 LAB — BASIC METABOLIC PANEL
Anion gap: 6 (ref 5–15)
BUN: 17 mg/dL (ref 6–20)
CO2: 27 mmol/L (ref 22–32)
Calcium: 8.5 mg/dL — ABNORMAL LOW (ref 8.9–10.3)
Chloride: 102 mmol/L (ref 98–111)
Creatinine, Ser: 1.05 mg/dL (ref 0.61–1.24)
GFR, Estimated: >60 mL/min (ref >60)
Glucose, Bld: 90 mg/dL (ref 70–99)
Potassium: 3.8 mmol/L (ref 3.5–5.1)
Sodium: 135 mmol/L (ref 135–145)

## 2021-11-01 LAB — CBC WITH DIFFERENTIAL/PLATELET
Abs Immature Granulocytes: 0.08 10*3/uL — ABNORMAL HIGH (ref 0.00–0.07)
Basophils Absolute: 0.1 10*3/uL (ref 0.0–0.1)
Basophils Relative: 1 %
Eosinophils Absolute: 0.3 10*3/uL (ref 0.0–0.5)
Eosinophils Relative: 2 %
HCT: 24.4 % — ABNORMAL LOW (ref 39.0–52.0)
Hemoglobin: 7.6 g/dL — ABNORMAL LOW (ref 13.0–17.0)
Immature Granulocytes: 1 %
Lymphocytes Relative: 22 %
Lymphs Abs: 3.2 10*3/uL (ref 0.7–4.0)
MCH: 31.7 pg (ref 26.0–34.0)
MCHC: 31.1 g/dL (ref 30.0–36.0)
MCV: 101.7 fL — ABNORMAL HIGH (ref 80.0–100.0)
Monocytes Absolute: 1.2 10*3/uL — ABNORMAL HIGH (ref 0.1–1.0)
Monocytes Relative: 8 %
Neutro Abs: 9.6 10*3/uL — ABNORMAL HIGH (ref 1.7–7.7)
Neutrophils Relative %: 66 %
Platelets: 253 10*3/uL (ref 150–400)
RBC: 2.4 MIL/uL — ABNORMAL LOW (ref 4.22–5.81)
RDW: 21.1 % — ABNORMAL HIGH (ref 11.5–15.5)
WBC: 14.4 10*3/uL — ABNORMAL HIGH (ref 4.0–10.5)
nRBC: 0 % (ref 0.0–0.2)

## 2021-11-01 LAB — GLUCOSE, CAPILLARY
Glucose-Capillary: 89 mg/dL (ref 70–99)
Glucose-Capillary: 75 mg/dL (ref 70–99)
Glucose-Capillary: 106 mg/dL — ABNORMAL HIGH (ref 70–99)
Glucose-Capillary: 97 mg/dL (ref 70–99)
Glucose-Capillary: 105 mg/dL — ABNORMAL HIGH (ref 70–99)

## 2021-11-01 LAB — MAGNESIUM: Magnesium: 1.6 mg/dL — ABNORMAL LOW (ref 1.7–2.4)

## 2021-11-01 MED ORDER — MAGNESIUM OXIDE -MG SUPPLEMENT 400 (240 MG) MG PO TABS
800.0000 mg | ORAL_TABLET | Freq: Every day | ORAL | Status: DC
  Administered 2021-11-01 – 2021-11-04 (×4): 800 mg via ORAL
  Filled 2021-11-01 (×4): qty 2

## 2021-11-01 NOTE — Progress Notes (Signed)
Progress Note    [name]   ENI:[id number]  DOB: [birth year]  DOA: 10/06/2021     25 PCP: [name], MD  Initial CC: Sores on backside  Hospital Course: [name] is a 36 yo male with PMH HIV, bipolar disorder, schizoaffective disorder, seizure disorder, HTN, tobacco use, migraine, peripheral neuropathy, PTSD who presented to the ER with complaints of sores on his buttocks.  There was concern for them having become infected. He underwent work-up on admission and was found to be hypotensive requiring vasopressor support in the ICU.  He then became more agitated and restless requiring Precedex and ultimately intubation.  He had improvement and was able to be extubated successfully.  He was transferred to [facility] service on 10/15/2021. He developed signs consistent with catatonia and psychiatry was consulted.  He had not been receiving benzodiazepine during hospitalization which he was on chronically at home prior to admission.  This was resumed on scheduled dosing and his catatonia resolved, he became more awake/alert, and was much more interactive. Unfortunately, due to his behaviors during hospitalization, he was declined from SNF bed offers.  He also declined any home health at discharge. He was strongly encouraged to remain abstinent from alcohol upon discharge.  Interval History:  No events overnight.  Ambulated 100' with RW with PT today. Required verbal cues for safety (no physical assist)  Assessment & Plan:  Severe deconditioning Failure to thrive and severe protein calorie malnutrition - has been followed by palliative care as well -Due to poor behavior during hospitalization, he has been declined by SNF offers; he also has declined home health.  - continue working with PT; he's still showing some progression and goal is to be able to have him be more mobile independently before him returning home  - as of 1/23 can walk 100' with RW requiring verbal cues but no physical  assistance  Acute on chronic metabolic encephalopathy Complicated by bipolar disorder, schizoaffective, EtOH abuse, opioid dependence and PTSD -Patient improved after extubation however was refusing some medications.  He had been off of benzodiazepines and developed catatonia; Ativan was restarted on 10/26/2021 and his catatonia resolved -He will require prolonged taper over months to wean him off benzos -Continue Klonopin 2 mg twice daily on discharge   Dysphagia - tentative plan for repeat MBS with SLP on 1/24 - continue current diet   Chronic hypotension - Completed stress dose steroids. Midodrine discontinued as well - expected in setting of cirrhosis  Alcoholic cirrhosis Chronic elevated ammonia - continue lactulose at reduced dose given diarrhea recently (seems to have improved); will adjust as needed  Septic shock 2/2 E. coli UTI /  LE ulcerations - resolved  Patient completed course of antibiotics on 10/15/2021.   Hypoglycemia - resolved  -Resolved as diet intake increased - D10 discontinued   Epistaxis: resolved Suspect bleeding from repeated NG tube insertions in context of thrombocytopenia and coagulopathy. Continue aspiration precautions.   HIV: Continue Biktarvy   Chronic anemia / thrombocytopenia Mild coagulopathy Suspected secondary to hx of EtOH abuse and cirrhosis S/p 2 units of platelets and 3 unit PRBC. Hemoglobin has remained stable, Continue to hold anticoagulation given hemoglobin drop   Open wound right upper arm Multiple scarring from prior ulceration Continue tetracycline as tolerated   Hypomagnesemia Replaced   Anasarca -Gross edema throughout -Suspect related to third spacing from malnutrition with albumin <1.5 -Improving with BID IV lasix with good diuresis -d/c lasix and monitor    Old records reviewed in  assessment of this patient   DVT prophylaxis: SCD  Code Status:   Code Status: DNR  Disposition Plan:  pending clinical  improvement, goal is home when more mobile/independent  Status is: Inpt  Objective: Blood pressure (!) 99/59, pulse 99, temperature 97.7 F (36.5 C), temperature source Oral, resp. rate 16, height 6' 3"  (1.905 m), weight 82 kg, SpO2 100 %.  Examination:  Physical Exam Constitutional:      General: He is not in acute distress.    Appearance: Normal appearance.     Comments: Chronically ill appearing  HENT:     Head: Normocephalic and atraumatic.     Mouth/Throat:     Mouth: Mucous membranes are moist.  Eyes:     Extraocular Movements: Extraocular movements intact.  Cardiovascular:     Rate and Rhythm: Normal rate and regular rhythm.     Heart sounds: Normal heart sounds.  Pulmonary:     Effort: Pulmonary effort is normal. No respiratory distress.     Breath sounds: Normal breath sounds. No wheezing.  Abdominal:     General: Bowel sounds are normal. There is no distension.     Palpations: Abdomen is soft.     Tenderness: There is no abdominal tenderness.  Musculoskeletal:        General: Normal range of motion.     Cervical back: Normal range of motion and neck supple.  Skin:    General: Skin is warm and dry.  Neurological:     General: No focal deficit present.     Mental Status: He is alert.  Psychiatric:        Mood and Affect: Mood normal.        Behavior: Behavior normal.     Consultants:    Procedures:    Data Reviewed: I have personally reviewed labs and imaging studies    LOS: 25 days  Time spent: Greater than 50% of the 35 minute visit was spent in counseling/coordination of care for the patient as laid out in the A&P.   Dwyane Dee, MD [facility] 11/01/2021, 3:27 PM

## 2021-11-01 NOTE — Progress Notes (Signed)
SLP Cancellation Note  Patient Details Name: [name] MRN: [id number] DOB: 1986/01/12   Cancelled treatment:       Reason Eval/Treat Not Completed: Fatigue/lethargy limiting ability to participate. Repeat MBS scheduled for today however RN notified radiology that patient was lethargic today. Plan to reschedule MBS for tomorrow if patient adequately alert.   Sonia Baller, MA, CCC-SLP Speech Therapy

## 2021-11-01 NOTE — Progress Notes (Signed)
Physical Therapy Treatment Patient Details Name: [name] MRN: [id number] DOB: 04/01/1986 Today's Date: 11/01/2021   History of Present Illness 35 yo male admitted from home with c/o buttock wounds, and dx with FTT and acute encephalopathy.  Pt with hx of HIV, PTSD, schizoaffective d/o, Sz, L hip AVN, neuropathy, polysubstance abuse    PT Comments    Pt agreeable to participate and therapist mentioned ambulating several times prior to pt standing however pt would only take a few steps over to recliner.  Pt appears awake/alert however also with little verbalizations this morning.    Recommendations for follow up therapy are one component of a multi-disciplinary discharge planning process, led by the attending physician.  Recommendations may be updated based on patient status, additional functional criteria and insurance authorization.  Follow Up Recommendations  Skilled nursing-short term rehab (<3 hours/day)     Assistance Recommended at Discharge Frequent or constant Supervision/Assistance  Patient can return home with the following A little help with walking and/or transfers;Assistance with cooking/housework;A lot of help with bathing/dressing/bathroom;Help with stairs or ramp for entrance   Equipment Recommendations  None recommended by PT    Recommendations for Other Services       Precautions / Restrictions Precautions Precautions: Fall Restrictions Weight Bearing Restrictions: No     Mobility  Bed Mobility Overal bed mobility: Modified Independent                  Transfers Overall transfer level: Needs assistance Equipment used: Rolling walker (2 wheels) Transfers: Sit to/from Stand, Bed to chair/wheelchair/BSC Sit to Stand: Min guard   Step pivot transfers: Min guard       General transfer comment: min/guard for safety, discussed ambulating prior to standing and also encouraged pt to ambulate into hallway however he would only take some steps  over to recliner    Ambulation/Gait                   Stairs             Wheelchair Mobility    Modified Rankin (Stroke Patients Only)       Balance                                            Cognition Arousal/Alertness: Awake/alert Behavior During Therapy: WFL for tasks assessed/performed Overall Cognitive Status: Within Functional Limits for tasks assessed                                          Exercises      General Comments        Pertinent Vitals/Pain Pain Assessment Pain Assessment: No/denies pain Pain Intervention(s): Repositioned    Home Living                          Prior Function            PT Goals (current goals can now be found in the care plan section) Progress towards PT goals: Progressing toward goals    Frequency    Min 3X/week      PT Plan Current plan remains appropriate    Co-evaluation              AM-PAC PT "  6 Clicks" Mobility   Outcome Measure  Help needed turning from your back to your side while in a flat bed without using bedrails?: A Little Help needed moving from lying on your back to sitting on the side of a flat bed without using bedrails?: A Little Help needed moving to and from a bed to a chair (including a wheelchair)?: A Little Help needed standing up from a chair using your arms (e.g., wheelchair or bedside chair)?: A Little Help needed to walk in hospital room?: A Lot Help needed climbing 3-5 steps with a railing? : A Lot 6 Click Score: 16    End of Session Equipment Utilized During Treatment: Gait belt Activity Tolerance: Patient tolerated treatment well Patient left: in chair;with call bell/phone within reach;with chair alarm set Nurse Communication: Mobility status PT Visit Diagnosis: Difficulty in walking, not elsewhere classified (R26.2)     Time: 1101-1110 PT Time Calculation (min) (ACUTE ONLY): 9 min  Charges:  $Therapeutic  Activity: 8-22 mins                    Jannette Spanner PT, DPT Acute Rehabilitation Services Pager: [phone] Office: [place] 11/01/2021, 12:57 PM

## 2021-11-01 NOTE — Progress Notes (Signed)
Physical Therapy Treatment Patient Details Name: [name] MRN: [id number] DOB: 05-24-1986 Today's Date: 11/01/2021   History of Present Illness 36 yo male admitted from home with c/o buttock wounds, and dx with FTT and acute encephalopathy.  Pt with hx of HIV, PTSD, schizoaffective d/o, Sz, L hip AVN, neuropathy, polysubstance abuse    PT Comments    Pt flagged therapist in hallway from his room and stated he was ready to walk.  Pt states he wants to go home.  Pt able to ambulate 100 feet with RW and min/guard assist for safety.  Pt did not require physical assist however required cues for safely using RW during ambulation.  Pt also did not state he needed to urinate however wearing primofit and observed urine flow into tubing so reconnected tubing to suction upon returning to room.   Recommendations for follow up therapy are one component of a multi-disciplinary discharge planning process, led by the attending physician.  Recommendations may be updated based on patient status, additional functional criteria and insurance authorization.  Follow Up Recommendations  Skilled nursing-short term rehab (<3 hours/day)     Assistance Recommended at Discharge Frequent or constant Supervision/Assistance  Patient can return home with the following Assistance with cooking/housework;Help with stairs or ramp for entrance;A little help with bathing/dressing/bathroom;A little help with walking and/or transfers;Direct supervision/assist for medications management   Equipment Recommendations  None recommended by PT    Recommendations for Other Services       Precautions / Restrictions Precautions Precautions: Fall     Mobility  Bed Mobility Overal bed mobility: Modified Independent                  Transfers Overall transfer level: Needs assistance Equipment used: Rolling walker (2 wheels) Transfers: Sit to/from Stand Sit to Stand: Min guard   Step pivot transfers: Min guard        General transfer comment: min/guard for safety    Ambulation/Gait Ambulation/Gait assistance: Min guard Gait Distance (Feet): 100 Feet Assistive device: Rolling walker (2 wheels) Gait Pattern/deviations: Step-through pattern, Decreased stride length, Trunk flexed Gait velocity: decr     General Gait Details: cues for posture and position within RW, min/guard for safety   Stairs             Wheelchair Mobility    Modified Rankin (Stroke Patients Only)       Balance           Standing balance support: Reliant on assistive device for balance Standing balance-Leahy Scale: Poor                              Cognition Arousal/Alertness: Awake/alert Behavior During Therapy: WFL for tasks assessed/performed Overall Cognitive Status: Within Functional Limits for tasks assessed                                          Exercises      General Comments        Pertinent Vitals/Pain Pain Assessment Pain Assessment: No/denies pain Pain Intervention(s): Repositioned    Home Living                          Prior Function            PT Goals (current goals can now  be found in the care plan section) Acute Rehab PT Goals Patient Stated Goal: wants to d/c home PT Goal Formulation: With patient Time For Goal Achievement: 11/08/21 Potential to Achieve Goals: Good Progress towards PT goals: Progressing toward goals    Frequency    Min 3X/week      PT Plan Current plan remains appropriate    Co-evaluation              AM-PAC PT "6 Clicks" Mobility   Outcome Measure  Help needed turning from your back to your side while in a flat bed without using bedrails?: A Little Help needed moving from lying on your back to sitting on the side of a flat bed without using bedrails?: A Little Help needed moving to and from a bed to a chair (including a wheelchair)?: A Little Help needed standing up from a chair  using your arms (e.g., wheelchair or bedside chair)?: A Little Help needed to walk in hospital room?: A Little Help needed climbing 3-5 steps with a railing? : A Lot 6 Click Score: 17    End of Session Equipment Utilized During Treatment: Gait belt Activity Tolerance: Patient tolerated treatment well Patient left: in chair;with call bell/phone within reach;with chair alarm set Nurse Communication: Mobility status PT Visit Diagnosis: Difficulty in walking, not elsewhere classified (R26.2)     Time: 9038-3338 PT Time Calculation (min) (ACUTE ONLY): 9 min  Charges:  $Gait Training: 8-22 mins                    Arlyce Dice, DPT Acute Rehabilitation Services Pager: [phone] Office: [place] 11/01/2021, 1:03 PM

## 2021-11-02 ENCOUNTER — Inpatient Hospital Stay (HOSPITAL_COMMUNITY): Payer: Medicare HMO

## 2021-11-02 DIAGNOSIS — R1310 Dysphagia, unspecified: Secondary | ICD-10-CM

## 2021-11-02 LAB — GLUCOSE, CAPILLARY
Glucose-Capillary: 116 mg/dL — ABNORMAL HIGH (ref 70–99)
Glucose-Capillary: 68 mg/dL — ABNORMAL LOW (ref 70–99)
Glucose-Capillary: 102 mg/dL — ABNORMAL HIGH (ref 70–99)
Glucose-Capillary: 86 mg/dL (ref 70–99)
Glucose-Capillary: 106 mg/dL — ABNORMAL HIGH (ref 70–99)

## 2021-11-02 NOTE — Progress Notes (Signed)
Discussed POC regarding PICC line removal. Patient VU. HOB >45*. Pt instructed to hold breath for line removal. Pressure held for 5 min, with no s/sx of bleeding at site. Instructed patient to report any s/sx of bleeding and pressure drsg applied. Pt instructed to remain in bed for 61mn, and keeping pressure drsg in place for 24* keeping it CDI. Pt VU. Notified nurse of line removal. HFran Lowes RN VAST

## 2021-11-02 NOTE — Progress Notes (Signed)
MBS completed, full report to follow.  Pt tested with nectar and thin barium only as he has not had issues with solids/puree in the past.  He demonstrates delayed swallow trigger to pyriform sinus at latest allowing laryngeal penetration and trace inconsistent aspiration of thin liquids with reflexive cough.   HOB reclined with controlled bolus effective to prevent aspiration but pt did require total cueing to maintain posture.  No aspiration or penetration of nectar observed.  Pt also tested with HOB neutral and with/without chin tuck posture.  HOB reclined decreased amount of laryngeal penetration significantly.  Of note, pt also took very small boluses despite verbal cues to take larger sips for increased accuracy/efficacy of testing.  SLP advises to continue nectar liquids for now and SLP will follow up for dysphagia, compensation training. He is fully alert today and cooperative.Using teach back, educated pt to recommendations.    Kathleen Lime, [place] SLP [place] Office [phone] Cell [phone]

## 2021-11-02 NOTE — Progress Notes (Signed)
Patient ambulated in the hallway with front wheel walker and standby assist of RN. Patient tolerated ambulation well. Patient requesting to sit in the chair after ambulating.

## 2021-11-02 NOTE — Progress Notes (Signed)
Progress Note    DIAMONTE STAVELY   FMB:[id number]  DOB: [birth year]  DOA: 10/06/2021     26 PCP: [name], MD  Initial CC: Sores on backside  Hospital Course: Mr. Fornwalt is a 36 yo male with PMH HIV, bipolar disorder, schizoaffective disorder, seizure disorder, HTN, tobacco use, migraine, peripheral neuropathy, PTSD who presented to the ER with complaints of sores on his buttocks.  There was concern for them having become infected. He underwent work-up on admission and was found to be hypotensive requiring vasopressor support in the ICU.  He then became more agitated and restless requiring Precedex and ultimately intubation.  He had improvement and was able to be extubated successfully.  He was transferred to [facility] service on 10/15/2021. He developed signs consistent with catatonia and psychiatry was consulted.  He had not been receiving benzodiazepine during hospitalization which he was on chronically at home prior to admission.  This was resumed on scheduled dosing and his catatonia resolved, he became more awake/alert, and was much more interactive. Unfortunately, due to his behaviors during hospitalization, he was declined from SNF bed offers.  He also declined any home health at discharge. He was strongly encouraged to remain abstinent from alcohol upon discharge.  Interval History:  No events overnight.  Ambulated 100' with RW with PT yesterday. Required verbal cues for safety (no physical assist). Hopefully in another couple days he'll be functional enough to go home.   Assessment & Plan:  Severe deconditioning Failure to thrive and severe protein calorie malnutrition - has been followed by palliative care as well -Due to poor behavior during hospitalization, he has been declined by SNF offers; he also has declined home health - continue working with PT; he's still showing some progression and goal is to be able to have him be more mobile independently before him returning home  (hopefully in next 2-3 days) - IF he were to have any recurrent decline in hospital, I think that's a sign we need re-involvement of palliative care for hospice discussions as, at that point, he's showing his physical reserve is too low to survive/thrive  Acute on chronic metabolic encephalopathy Complicated by bipolar disorder, schizoaffective, EtOH abuse, opioid dependence and PTSD -Patient improved after extubation however was refusing some medications.  He had been off of benzodiazepines and developed catatonia; Ativan was restarted on 10/26/2021 and his catatonia resolved -He will require prolonged taper over months to wean him off benzos -Continue Klonopin 2 mg twice daily on discharge  - needs dependable outpatient followup  Dysphagia - tentative plan for repeat MBS with SLP on 1/24: rec'd for regular solids and nectar thick liquids  Chronic hypotension - Completed stress dose steroids. Midodrine discontinued as well - expected in setting of cirrhosis  Alcoholic cirrhosis Chronic elevated ammonia - continue lactulose at reduced dose given diarrhea recently (seems to have improved); will adjust as needed; current dose appears adequate  Septic shock 2/2 E. coli UTI /  LE ulcerations - resolved  Patient completed course of antibiotics on 10/15/2021.   Hypoglycemia - resolved  -Resolved as diet intake increased - D10 discontinued   Epistaxis: resolved Suspect bleeding from repeated NG tube insertions in context of thrombocytopenia and coagulopathy. Continue aspiration precautions.   HIV: Continue Biktarvy   Chronic anemia / thrombocytopenia Mild coagulopathy Suspected secondary to hx of EtOH abuse and cirrhosis S/p 2 units of platelets and 3 unit PRBC. Hemoglobin has remained stable, Continue to hold anticoagulation given hemoglobin drop   Open wound  right upper arm Multiple scarring from prior ulceration Continue tetracycline as tolerated   Hypomagnesemia Replaced    Anasarca -Gross edema throughout -Suspect related to third spacing from malnutrition with albumin <1.5 -Improving with BID IV lasix with good diuresis -d/c lasix and monitor    Old records reviewed in assessment of this patient   DVT prophylaxis: SCD  Code Status:   Code Status: DNR  Disposition Plan:  pending clinical improvement, goal is home when more mobile/independent  Status is: Inpt  Objective: Blood pressure 107/67, pulse (!) 115, temperature (!) 97.4 F (36.3 C), temperature source Oral, resp. rate 17, height 6' 3"  (1.905 m), weight 82.1 kg, SpO2 100 %.  Examination:  Physical Exam Constitutional:      General: He is not in acute distress.    Appearance: Normal appearance.     Comments: Chronically ill appearing  HENT:     Head: Normocephalic and atraumatic.     Mouth/Throat:     Mouth: Mucous membranes are moist.  Eyes:     Extraocular Movements: Extraocular movements intact.  Cardiovascular:     Rate and Rhythm: Normal rate and regular rhythm.     Heart sounds: Normal heart sounds.  Pulmonary:     Effort: Pulmonary effort is normal. No respiratory distress.     Breath sounds: Normal breath sounds. No wheezing.  Abdominal:     General: Bowel sounds are normal. There is no distension.     Palpations: Abdomen is soft.     Tenderness: There is no abdominal tenderness.  Musculoskeletal:        General: Normal range of motion.     Cervical back: Normal range of motion and neck supple.  Skin:    General: Skin is warm and dry.  Neurological:     General: No focal deficit present.     Mental Status: He is alert.  Psychiatric:        Mood and Affect: Mood normal.        Behavior: Behavior normal.     Consultants:    Procedures:    Data Reviewed: I have personally reviewed labs and imaging studies    LOS: 26 days  Time spent: Greater than 50% of the 35 minute visit was spent in counseling/coordination of care for the patient as laid out in the A&P.    Dwyane Dee, MD [facility] 11/02/2021, 3:18 PM

## 2021-11-02 NOTE — Progress Notes (Signed)
Modified Barium Swallow Progress Note  Patient Details  Name: [name] MRN: [id number] Date of Birth: July 09, 1986  Today's Date: 11/02/2021  Modified Barium Swallow completed.  Full report located under Chart Review in the Imaging Section.  Brief recommendations include the following:  Clinical Impression  Pt tested with nectar and thin barium only as he has not had issues with solids/puree in the past.  He demonstrates delayed swallow trigger to pyriform sinus at latest allowing laryngeal penetration and trace inconsistent aspiration of thin liquids with reflexive cough.      HOB reclined with controlled bolus effective to prevent aspiration but pt did require total cueing to maintain posture.  No aspiration or penetration of nectar observed.  Pt also tested with HOB neutral and with/without chin tuck posture.  HOB reclined decreased amount of laryngeal penetration significantly.  Of note, pt also took very small boluses despite verbal cues to take larger sips for increased accuracy/efficacy of testing.  SLP advises to continue nectar liquids for now and SLP will follow up for dysphagia, compensation training. He is fully alert today and cooperative.Using teach back, educated pt to recommendations.  Today pt is fully awake and voice is clear - which was not encountered   Swallow Evaluation Recommendations       SLP Diet Recommendations: Regular solids;Nectar thick liquid       Medication Administration: Whole meds with puree   Supervision: Full assist for feeding;Staff to assist with self feeding   Compensations: Slow rate;Small sips/bites       Oral Care Recommendations: Oral care BID      Kathleen Lime, MS [facility] SLP Acute Rehab Services Office [phone] Cell [phone]   [name] 11/02/2021,1:17 PM

## 2021-11-02 NOTE — TOC Progression Note (Signed)
Transition of Care [facility]) - Progression Note    Patient Details  Name: [name] MRN: [id number] Date of Birth: 1986/05/08  Transition of Care [facility]) CM/SW Contact  Zoya Sprecher, Marjie Skiff, RN Phone Number: 11/02/2021, 3:01 PM  Clinical Narrative:    TOC continues to follow for dc planning. Pt is progressing well with physical therapy and also ambulating with nursing staff. Pt continues to have no SNF bed offers so will likely go home at dc.       Readmission Risk Interventions Readmission Risk Prevention Plan 08/24/2021 08/24/2021 11/01/2019  Transportation Screening Complete - -  PCP or Specialist Appt within 3-5 Days Complete Complete -  Home Care Screening - - Complete  Medication Review (RN CM) - - Complete  HRI or Home Care Consult Complete Complete -  Social Work Consult for [place] Planning/Counseling - Complete -  Palliative Care Screening Complete Complete -  Medication Review [employer]) Complete Complete -  Some recent data might be hidden

## 2021-11-02 NOTE — Progress Notes (Signed)
0700-1900 PICC line d/c per md order. Tolerated well and dressing is dry an intact. MD okay with no iv access unless needed. Not receiving iv antibiotics or fluids.

## 2021-11-03 LAB — GLUCOSE, CAPILLARY
Glucose-Capillary: 76 mg/dL (ref 70–99)
Glucose-Capillary: 97 mg/dL (ref 70–99)
Glucose-Capillary: 117 mg/dL — ABNORMAL HIGH (ref 70–99)
Glucose-Capillary: 115 mg/dL — ABNORMAL HIGH (ref 70–99)

## 2021-11-03 NOTE — Progress Notes (Signed)
[facility] PROGRESS NOTE    Progress Note  [name]  EXH:[id number] DOB: 1986/08/11 DOA: 10/06/2021 PCP: [name], MD     Brief Narrative:   [name] is an 36 y.o. male past medical history of HIV bipolar disorder, alcohol abuse and schizoaffective disorder who presented with complaint of soreness in his bump there was a concern it was becoming infected became septic and hypotensive, agitated and restless had to be placed on Precedex and had to be intubated subsequently extubated transferred to [facility] on 10/15/2021.  Developed signs of catatonia psychiatry was consulted, his benzodiazepines were stopped on admission and never resumed psychiatry recommended to resume these and catatonia resolved.  Unfortunately due to his behavior he was declined from SNF.    Assessment/Plan:   Severe deconditioning/failure to thrive and severe protein caloric malnutrition: Palliative Care was consulted and following. Due to poor behavior was declined from SNF, he is also declined home health. He is working with physical therapy and has been progressing quite nicely should be able to discharge in a day or 2.  Acute on chronic metabolic encephalopathy complicated by bipolar disorder schizoaffective disorder alcohol abuse and opiate dependence. Status post extubation. Require prolonged weaning of benzodiazepines. Follow-up with the pain clinic as an outpatient.  Dysphagia: SLP done now on regular solids and nectar thick liquids.  Chronic hypotension: Completed his stress dose of steroids, now has been weaned off midodrine.  In the setting of cirrhosis is currently asymptomatic.  Alcoholic cirrhosis/chronic elevated ammonia: Continue lactulose at current dose.  Septic shock secondary to E. coli UTI/left extremity ulceration resolved: Complete his course of antibiotics on 10/15/2021.  Hypoglycemia: Was treated D10 now resolved.  Epistaxis: Likely due to NG tube  in the setting of thrombocytopenia now resolved.  HIV: Continue Baktarvy.  Chronic anemia/chronic thrombocytopenia: Secondary to alcohol abuse he status post 2 units of platelets and 3 units of packed red blood cells. Cytopenia has resolved hemoglobin has remained relatively stable.  Open wound right upper arm: With multiple scarring tolerated tetracycline.  Hypomagnesemia: Repleted now resolved.  Anasarca: Likely due to hypoalbuminemia less than 1.5. Lasix has been discontinued.    DVT prophylaxis: lovenox Family Communication:none Status is: Inpatient  Remains inpatient appropriate because: probably home in am    Code Status:     Code Status Orders  (From admission, onward)           Start     Ordered   10/14/21 1207  Do not attempt resuscitation (DNR)  Continuous        10/14/21 1206           Code Status History     Date Active Date Inactive Code Status Order ID Comments User Context   10/07/2021 0228 10/14/2021 1206 Full Code [id number]  [name], MD ED   08/24/2021 0200 09/01/2021 0258 Full Code [id number]  [name], MD ED   05/07/2021 1912 05/19/2021 1841 Full Code [id number]  Drew Corning, MD ED   03/15/2020 0606 03/20/2020 1650 Full Code [id number]  Drew Bulls, MD ED   10/27/2019 1815 11/02/2019 1902 Full Code [id number]  Drew Corning, MD Inpatient   07/22/2018 1722 07/22/2018 2158 Full Code [id number]  Drew Hal, NP Inpatient   07/22/2018 0832 07/22/2018 1703 Full Code [id number]  Drew Muskrat, MD ED   05/12/2015 1753 05/15/2015 1723 Full Code [id number]  Drew Gant, NP Inpatient   05/12/2015 0546 05/12/2015 1753 Full Code [id number]  Drew Berkshire  J, MD ED   01/12/2014 0256 01/12/2014 1650 Full Code [id number]  Drew Lower, MD ED         IV Access:   Peripheral IV   Procedures and diagnostic studies:   DG Swallowing Func-Speech Pathology  Result Date: 11/02/2021 Table formatting from the original  result was not included. Objective Swallowing Evaluation: Type of Study: MBS-Modified Barium Swallow Study  Patient Details Name: [name] MRN: [id number] Date of Birth: Sep 19, 1986 Today's Date: 11/02/2021 Time: SLP Start Time (ACUTE ONLY): 0805 -SLP Stop Time (ACUTE ONLY): 0835 SLP Time Calculation (min) (ACUTE ONLY): 30 min Past Medical History: Past Medical History: Diagnosis Date  Bipolar 1 disorder ([place])   Depression   Dizziness and giddiness 02/01/2016  Herpes genitalia   HIV disease ([place])   Hypertension   Hyponatremia   Hypothermia 08/24/2021  Migraine headache 02/01/2016  Peripheral neuropathy 10/01/2019  PTSD (post-traumatic stress disorder)   Schizoaffective disorder ([place])   Seizures ([place])  Past Surgical History: Past Surgical History: Procedure Laterality Date  BACK SURGERY    BIOPSY  02/26/2021  Procedure: BIOPSY;  Surgeon: Otis Brace, MD;  Location: [facility];  Service: Gastroenterology;;  COLONOSCOPY WITH PROPOFOL [name] 02/26/2021  Procedure: COLONOSCOPY WITH PROPOFOL;  Surgeon: Otis Brace, MD;  Location: [facility];  Service: Gastroenterology;  Laterality: [name];  ESOPHAGOGASTRODUODENOSCOPY (EGD) WITH PROPOFOL [name] 02/26/2021  Procedure: ESOPHAGOGASTRODUODENOSCOPY (EGD) WITH PROPOFOL;  Surgeon: Otis Brace, MD;  Location: [facility];  Service: Gastroenterology;  Laterality: [name];  ESOPHAGOGASTRODUODENOSCOPY (EGD) WITH PROPOFOL [name] 05/18/2021  Procedure: ESOPHAGOGASTRODUODENOSCOPY (EGD) WITH PROPOFOL;  Surgeon: Otis Brace, MD;  Location: [facility];  Service: Gastroenterology;  Laterality: [name];  HAND SURGERY   HPI: 36 y.o. male with hx of controlled HIV on medications, bipolar disorder, schizophrenia presented to the ED via EMS with complaints of sore on his buttocks. Dx sepsis. Hx of dysphagia and aspiration during prior admissions. Voice has been documented as weak/hoarse with low volume.  MBS 10/30/19 revealed pharyngeal dysphagia with intermittent aspiration of large  boluses of thin liquids - dysphagia at that time was attributed to effects of COVID on respiratory/swallowing sequence. Repeat MBS 05/08/21 showed similar deficits with delay of swallow onset after materials were sitting in the pharynx, leading to spillover into the airway. During both studies, modifying bolus size helped to prevent aspiration. His swallow was evaluated again at the bedside on 08/25/20, clinical impression was unchanged. When taking smaller sips of liquids there were no instances of coughing.  On morning of 12/30, pt was observed to likely aspirate applesauce, hence consult placed for SLP swallow eval.  Subjective: pt alert  Recommendations for follow up therapy are one component of a multi-disciplinary discharge planning process, led by the attending physician.  Recommendations may be updated based on patient status, additional functional criteria and insurance authorization. Assessment / Plan / Recommendation Clinical Impressions 11/02/2021 Clinical Impression Pt tested with nectar and thin barium only as he has not had issues with solids/puree in the past.  He demonstrates delayed swallow trigger to pyriform sinus at latest allowing laryngeal penetration and trace inconsistent aspiration of thin liquids with reflexive cough.      HOB reclined with controlled bolus effective to prevent aspiration but pt did require total cueing to maintain posture.  No aspiration or penetration of nectar observed.  Pt also tested with HOB neutral and with/without chin tuck posture.  HOB reclined decreased amount of laryngeal penetration significantly.  Of note, pt also took very small boluses despite verbal cues to take  larger sips for increased accuracy/efficacy of testing.  SLP advises to continue nectar liquids for now and SLP will follow up for dysphagia, compensation training. He is fully alert today and cooperative.Using teach back, educated pt to recommendations.  Today pt is fully awake and voice is clear -  which was not encountered SLP Visit Diagnosis Dysphagia, oropharyngeal phase (R13.12) Attention and concentration deficit following -- Frontal lobe and executive function deficit following -- Impact on safety and function Mild aspiration risk   Treatment Recommendations 11/02/2021 Treatment Recommendations Therapy as outlined in treatment plan below   Prognosis 11/02/2021 Prognosis for Safe Diet Advancement Fair Barriers to Reach Goals Cognitive deficits Barriers/Prognosis Comment -- Diet Recommendations 11/02/2021 SLP Diet Recommendations Regular solids;Nectar thick liquid Liquid Administration via -- Medication Administration Whole meds with puree Compensations Slow rate;Small sips/bites Postural Changes --   Other Recommendations 11/02/2021 Recommended Consults -- Oral Care Recommendations Oral care BID Other Recommendations -- Follow Up Recommendations Skilled nursing-short term rehab (<3 hours/day) Assistance recommended at discharge -- Functional Status Assessment Patient has had a recent decline in their functional status and/or demonstrates limited ability to make significant improvements in function in a reasonable and predictable amount of time Frequency and Duration  11/02/2021 Speech Therapy Frequency (ACUTE ONLY) min 2x/week Treatment Duration 2 weeks   Oral Phase 11/02/2021 Oral Phase -- Oral - Pudding Teaspoon -- Oral - Pudding Cup -- Oral - Honey Teaspoon -- Oral - Honey Cup -- Oral - Nectar Teaspoon Delayed oral transit;Decreased bolus cohesion Oral - Nectar Cup Delayed oral transit;Decreased bolus cohesion Oral - Nectar Straw Delayed oral transit;Decreased bolus cohesion Oral - Thin Teaspoon Decreased bolus cohesion;Delayed oral transit Oral - Thin Cup Delayed oral transit;Decreased bolus cohesion Oral - Thin Straw Delayed oral transit;Decreased bolus cohesion Oral - Puree NT Oral - Mech Soft -- Oral - Regular NT Oral - Multi-Consistency -- Oral - Pill -- Oral Phase - Comment --  Pharyngeal Phase  11/02/2021 Pharyngeal Phase -- Pharyngeal- Pudding Teaspoon -- Pharyngeal -- Pharyngeal- Pudding Cup -- Pharyngeal -- Pharyngeal- Honey Teaspoon -- Pharyngeal -- Pharyngeal- Honey Cup -- Pharyngeal -- Pharyngeal- Nectar Teaspoon Delayed swallow initiation-pyriform sinuses Pharyngeal -- Pharyngeal- Nectar Cup Delayed swallow initiation-pyriform sinuses Pharyngeal -- Pharyngeal- Nectar Straw Delayed swallow initiation-pyriform sinuses Pharyngeal -- Pharyngeal- Thin Teaspoon Delayed swallow initiation-pyriform sinuses Pharyngeal -- Pharyngeal- Thin Cup Delayed swallow initiation-pyriform sinuses Pharyngeal -- Pharyngeal- Thin Straw Delayed swallow initiation-pyriform sinuses Pharyngeal Material does not enter airway Pharyngeal- Puree NT Pharyngeal -- Pharyngeal- Mechanical Soft -- Pharyngeal -- Pharyngeal- Regular NT Pharyngeal -- Pharyngeal- Multi-consistency -- Pharyngeal -- Pharyngeal- Pill -- Pharyngeal -- Pharyngeal Comment --  Cervical Esophageal Phase  11/02/2021 Cervical Esophageal Phase WFL Pudding Teaspoon -- Pudding Cup -- Honey Teaspoon -- Honey Cup -- Nectar Teaspoon -- Nectar Cup -- Nectar Straw -- Thin Teaspoon -- Thin Cup -- Thin Straw -- Puree -- Mechanical Soft -- Regular -- Multi-consistency -- Pill -- Cervical Esophageal Comment -- Kathleen Lime, MS [facility] SLP Acute Rehab Services Office [phone] Cell [phone] [name] 11/02/2021, 1:18 PM                       Medical Consultants:   None.   Subjective:    Keivon Marijo Sanes relates he feels good now is willing to go home with home health PT.  Objective:    Vitals:   11/02/21 1531 11/02/21 1744 11/02/21 2005 11/03/21 0453  BP: 106/68 (!) 98/56 (!) 91/50 98/60  Pulse: 94 90 99  97  Resp: 17 17 16 16   Temp: 98.8 F (37.1 C) 98.8 F (37.1 C) 97.8 F (36.6 C) 98.8 F (37.1 C)  TempSrc: Oral Oral Oral Oral  SpO2: 100% 100% 100% 98%  Weight:    84.9 kg  Height:       SpO2: 98 % O2 Flow Rate (L/min): 2  L/min   Intake/Output Summary (Last 24 hours) at 11/03/2021 0850 Last data filed at 11/03/2021 0552 Gross per 24 hour  Intake 120 ml  Output 500 ml  Net -380 ml   Filed Weights   11/01/21 0408 11/02/21 0456 11/03/21 0453  Weight: 82 kg 82.1 kg 84.9 kg    Exam: General exam: In no acute distress. Respiratory system: Good air movement and clear to auscultation. Cardiovascular system: S1 & S2 heard, RRR. No JVD. Gastrointestinal system: Abdomen is nondistended, soft and nontender.  Extremities: No pedal edema. Psychiatry: Judgement and insight appear normal. Mood & affect appropriate.    Data Reviewed:    Labs: Basic Metabolic Panel: Recent Labs  Lab 10/28/21 0528 10/29/21 1218 10/30/21 0445 10/31/21 0600 11/01/21 0421  NA 132* 134* 138 136 135  K 3.2* 3.3* 3.3* 3.7 3.8  CL 94* 99 102 102 102  CO2 29 27 30 29 27   GLUCOSE 135* 110* 89 84 90  BUN 18 19 17 17 17   CREATININE 1.30* 1.49* 1.20 1.07 1.05  CALCIUM 8.2* 8.0* 8.2* 8.4* 8.5*  MG 1.6* 2.1 1.9 1.8 1.6*   GFR Estimated Creatinine Clearance: 115.1 mL/min (by C-G formula based on SCr of 1.05 mg/dL). Liver Function Tests: No results for input(s): AST, ALT, ALKPHOS, BILITOT, PROT, ALBUMIN in the last 168 hours. No results for input(s): LIPASE, AMYLASE in the last 168 hours. No results for input(s): AMMONIA in the last 168 hours. Coagulation profile No results for input(s): INR, PROTIME in the last 168 hours. COVID-19 Labs  No results for input(s): DDIMER, FERRITIN, LDH, CRP in the last 72 hours.  Lab Results  Component Value Date   SARSCOV2NAA NEGATIVE 10/07/2021   SARSCOV2NAA NEGATIVE 08/23/2021   SARSCOV2NAA NEGATIVE 05/07/2021   SARSCOV2NAA DETECTED (A) 11/09/2020    CBC: Recent Labs  Lab 10/28/21 0528 10/29/21 1218 10/30/21 0445 10/31/21 0600 11/01/21 0421  WBC 18.2* 15.1* 15.7* 11.7* 14.4*  NEUTROABS 12.3* 10.1* 10.7* 7.6 9.6*  HGB 8.3* 7.8* 7.5* 7.5* 7.6*  HCT 25.5* 24.8* 24.0* 23.8* 24.4*   MCV 98.1 100.4* 101.3* 101.3* 101.7*  PLT 210 213 215 218 253   Cardiac Enzymes: No results for input(s): CKTOTAL, CKMB, CKMBINDEX, TROPONINI in the last 168 hours. BNP (last 3 results) No results for input(s): PROBNP in the last 8760 hours. CBG: Recent Labs  Lab 11/02/21 1145 11/02/21 1502 11/02/21 1701 11/02/21 2007 11/03/21 0721  GLUCAP 68* 102* 86 106* 76   D-Dimer: No results for input(s): DDIMER in the last 72 hours. Hgb A1c: No results for input(s): HGBA1C in the last 72 hours. Lipid Profile: No results for input(s): CHOL, HDL, LDLCALC, TRIG, CHOLHDL, LDLDIRECT in the last 72 hours. Thyroid function studies: No results for input(s): TSH, T4TOTAL, T3FREE, THYROIDAB in the last 72 hours.  Invalid input(s): FREET3 Anemia work up: No results for input(s): VITAMINB12, FOLATE, FERRITIN, TIBC, IRON, RETICCTPCT in the last 72 hours. Sepsis Labs: Recent Labs  Lab 10/29/21 1218 10/30/21 0445 10/31/21 0600 11/01/21 0421  WBC 15.1* 15.7* 11.7* 14.4*   Microbiology No results found for this or any previous visit (from the past 240 hour(s)).  Medications:    (feeding supplement) PROSource Plus  30 mL Oral QID   vitamin C  500 mg Oral BID   bictegravir-emtricitabine-tenofovir AF  1 tablet Oral Daily   chlorhexidine  15 mL Mouth Rinse BID   Chlorhexidine Gluconate Cloth  6 each Topical Q0600   escitalopram  20 mg Oral Daily   feeding supplement  237 mL Oral BID BM   folic acid  1 mg Oral Daily   Gerhardt's butt cream   Topical BID   lactulose  10 g Oral BID   LORazepam  2 mg Oral BID   magnesium oxide  800 mg Oral Daily   mouth rinse  15 mL Mouth Rinse q12n4p   multivitamin with minerals  1 tablet Oral Daily   mupirocin ointment   Nasal BID   nicotine  14 mg Transdermal Daily   OLANZapine zydis  5 mg Oral QHS   pantoprazole  40 mg Oral Daily   sodium chloride flush  10-40 mL Intracatheter Q12H   tetracycline  500 mg Oral BID   thiamine  250 mg Oral Daily    valACYclovir  1,000 mg Oral Daily   zinc sulfate  220 mg Oral Daily   Continuous Infusions:  sodium chloride Stopped (10/14/21 0548)   sodium chloride Stopped (10/28/21 1547)      LOS: 27 days   Charlynne Cousins  [facility]  11/03/2021, 8:50 AM

## 2021-11-04 DIAGNOSIS — B20 Human immunodeficiency virus [HIV] disease: Secondary | ICD-10-CM

## 2021-11-04 DIAGNOSIS — F251 Schizoaffective disorder, depressive type: Secondary | ICD-10-CM

## 2021-11-04 LAB — GLUCOSE, CAPILLARY: Glucose-Capillary: 138 mg/dL — ABNORMAL HIGH (ref 70–99)

## 2021-11-04 MED ORDER — OLANZAPINE 5 MG PO TBDP: 5.0000 mg | ORAL_TABLET | Freq: Every day | ORAL | 0 refills | Status: DC

## 2021-11-04 MED ORDER — CLONAZEPAM 0.5 MG PO TABS: 0.5000 mg | ORAL_TABLET | Freq: Two times a day (BID) | ORAL | 0 refills | Status: DC

## 2021-11-04 NOTE — Discharge Summary (Signed)
Physician Discharge Summary  MAINOR HELLMANN VQX:[id number] DOB: Feb 23, 1986 DOA: 10/06/2021  PCP: [name], MD  Admit date: 10/06/2021 Discharge date: 11/04/2021  Admitted From: Home Disposition:  Home  Recommendations for Outpatient Follow-up:  Follow up with PCP in 1-2 weeks Please obtain BMP/CBC in one week Follow-up with psychiatry and titrate his benzodiazepines down.   Home Health:No Equipment/Devices:None  Discharge Condition:Stable CODE STATUS:Full Diet recommendation: Heart Healthy   Brief/Interim Summary: 36 y.o. male past medical history of HIV bipolar disorder, alcohol abuse and schizoaffective disorder who presented with complaint of soreness in his buttock, there was a concern it was becoming infected became septic and hypotensive, agitated and restless had to be placed on Precedex and had to be intubated subsequently extubated transferred to triad service on 10/15/2021.  Developed signs of catatonia psychiatry was consulted, his benzodiazepines were stopped on admission and never resumed psychiatry recommended to resume these and catatonia resolved.  Unfortunately due to his behavior he was declined from SNF.  Discharge Diagnoses:  Active Problems:   Schizoaffective disorder, depressive type (HCC)   Hypokalemia   HIV disease (HCC)   Macrocytic anemia   Thrombocytopenia (HCC)   Prolonged QT interval   Avascular necrosis of femoral head, left (HCC)   Alcoholic cirrhosis of liver without ascites (HCC)   Cellulitis of multiple sites of buttock   Protein-calorie malnutrition, severe (HCC)   DNR (do not resuscitate)   Aspiration pneumonia ([place])   FTT (failure to thrive) in adult   Dysphagia   Coagulopathy ([place])   Palliative care by specialist  Severe deconditioning/failure to thrive and severe protein caloric malnutrition: Nutrition and palliative care was consulted he was started on Ensure 3 times daily. Due to his poor behavior was declined from SNF, he  was also declining home health services. Physical therapy evaluated the patient he elected to go home as he was improving rapidly.    Acute on chronic metabolic encephalopathy complicated by bipolar disorder schizoaffective disorder in the setting of alcohol abuse and opiate abuse: Due to his agitation in the setting of hypotension had to be intubated successfully extubated He has been off benzodiazepine and developed catatonia.  Psychiatry was consulted recommended to restart Ativan on 10/26/2021 and his catatonia resolved. He was switched to oral Klonopin which she will continue as an outpatient and will need to be further titrated slowly.  Catatonia: Psychiatry was consulted as his benzos were stopped he developed catatonia psychiatry recommended to resume benzodiazepine at a lower dose and the Olaxapine. Which she will continue as an outpatient. Will need to follow-up with psychiatry and titrate his benzodiazepines down slowly.  Dysphagia: Resolved.  Chronic hypotension: Completed course of stress dose steroids.  Has been weaned off midodrine. In the setting of cirrhosis she is currently asymptomatic.  Alcoholic cirrhosis/chronic elevated ammonia: Continue lactulose at current dose.  Septic shock secondary to E. coli/left lower extremity ulceration: Completed his course in-house.  Hyperglycemia: Was was treated with D10, tolerating his diet.  Epistaxis: Low due to NG tube and thrombocytopenia now it is resolved.  HIV: Continue baktarvy.  Chronic anemia/chronic thrombocytopenia: Secondary to alcohol abuse he was transfused 2 units of platelets with units of packed red blood cells his cytopenia remained stable. Had no episodes of bleeding.  Open wound of the right upper extremity: Multiple scarring, He was tolerating tetracycline which she tolerated and completed his course in-house.  Hypomagnesemia: Repleted orally now resolved.  Anasarca: Likely due to  hypoalbuminemia, Lasix has been discontinued anasarca slowly improving.  Discharge Instructions  Discharge Instructions     Diet - low sodium heart healthy   Complete by: As directed    Discharge wound care:   Complete by: As directed    See discharge wound care instructions   Increase activity slowly   Complete by: As directed       Allergies as of 11/04/2021       Reactions   Dapsone Other (See Comments)   Per centricity "G6PD deficient"   Primaquine Phosphate Other (See Comments)   Per Centricity "G6PD deficient"        Medication List     STOP taking these medications    (feeding supplement) PROSource Plus liquid   albuterol 108 (90 Base) MCG/ACT inhaler Commonly known as: VENTOLIN HFA   alprazolam 2 MG tablet Commonly known as: XANAX   benzonatate 100 MG capsule Commonly known as: TESSALON   cyclobenzaprine 10 MG tablet Commonly known as: FLEXERIL   diphenoxylate-atropine 2.5-0.025 MG tablet Commonly known as: LOMOTIL   doxycycline 100 MG tablet Commonly known as: VIBRA-TABS   haloperidol 5 MG tablet Commonly known as: HALDOL   ibuprofen 800 MG tablet Commonly known as: ADVIL   Lurasidone HCl 120 MG Tabs   meclizine 25 MG tablet Commonly known as: ANTIVERT   midodrine 10 MG tablet Commonly known as: PROAMATINE   pantoprazole 40 MG tablet Commonly known as: PROTONIX   thiamine 100 MG tablet   traZODone 100 MG tablet Commonly known as: DESYREL   traZODone 50 MG tablet Commonly known as: DESYREL       TAKE these medications    bictegravir-emtricitabine-tenofovir AF 50-200-25 MG Tabs tablet Commonly known as: BIKTARVY Take 1 tablet by mouth daily.   clonazePAM 0.5 MG tablet Commonly known as: KlonoPIN Take 1 tablet (0.5 mg total) by mouth 2 (two) times daily.   escitalopram 20 MG tablet Commonly known as: LEXAPRO Take 1 tablet by mouth daily.   famotidine 40 MG tablet Commonly known as: PEPCID Take 40 mg by mouth every  morning.   folic acid 1 MG tablet Commonly known as: FOLVITE Take 1 mg by mouth daily.   food thickener Gel Commonly known as: SIMPLYTHICK (NECTAR/LEVEL 2/MILDLY THICK) Take 1 packet by mouth as needed.   furosemide 40 MG tablet Commonly known as: LASIX Take 40 mg by mouth daily as needed for fluid.   HYDROcodone-acetaminophen 5-325 MG tablet Commonly known as: NORCO/VICODIN Take 1 tablet by mouth 2 (two) times daily as needed.   lactulose 10 GM/15ML solution Commonly known as: CHRONULAC Take 30 mLs (20 g total) by mouth 2 (two) times daily.   magnesium oxide 400 (240 Mg) MG tablet Commonly known as: MAG-OX Take 1 tablet (400 mg total) by mouth daily.   multivitamin with minerals Tabs tablet Take 1 tablet by mouth daily.   nicotine 14 mg/24hr patch Commonly known as: NICODERM CQ - dosed in mg/24 hours Place 1 patch (14 mg total) onto the skin daily.   OLANZapine zydis 5 MG disintegrating tablet Commonly known as: ZYPREXA Take 1 tablet (5 mg total) by mouth at bedtime.   ondansetron 8 MG tablet Commonly known as: ZOFRAN Take 8 mg by mouth every 8 (eight) hours as needed for nausea/vomiting, nausea or vomiting.   topiramate 100 MG tablet Commonly known as: TOPAMAX Take 100 mg by mouth at bedtime.   valACYclovir 1000 MG tablet Commonly known as: VALTREX Take 1 tablet (1,000 mg total) by mouth daily.  Discharge Care Instructions  (From admission, onward)           Start     Ordered   11/04/21 0000  Discharge wound care:       Comments: See discharge wound care instructions   11/04/21 0804            Follow-up Information     [name], MD. Schedule an appointment as soon as possible for a visit in 1 week(s).   Specialty: Internal Medicine Contact information: [place] [address] [phone]                Allergies  Allergen Reactions   Dapsone Other (See Comments)    Per centricity "G6PD  deficient"   Primaquine Phosphate Other (See Comments)    Per Centricity "G6PD deficient"    Consultations: Pulmonary and critical care Psychiatry   Procedures/Studies: DG Abd 1 View  Result Date: 10/12/2021 CLINICAL DATA:  Nasoenteric feeding tube placement EXAM: ABDOMEN - 1 VIEW COMPARISON:  10:10 a.m. FINDINGS: Nasoenteric feeding tube tip extends into the expected mid to distal body of the stomach. Visualized abdominal gas pattern is unremarkable. Multifocal pulmonary infiltrates noted. Right internal jugular central venous catheter tip noted within the right atrium. IMPRESSION: Nasoenteric feeding tube tip within the mid to distal body of the stomach. Electronically Signed   By: Fidela Salisbury M.D.   On: 10/12/2021 19:39   DG Abd 1 View  Result Date: 10/12/2021 CLINICAL DATA:  NG tube placement EXAM: ABDOMEN - 1 VIEW COMPARISON:  10/11/2021 FINDINGS: Unchanged position of weighted enteric feeding tube, tip below the diaphragm, projecting in the vicinity of the gastric body. Metallic stylette has been removed. Nonobstructive pattern of included bowel gas. No obvious free air on supine radiograph. IMPRESSION: Unchanged position of weighted enteric feeding tube, tip below the diaphragm, projecting in the vicinity of the gastric body. Metallic stylette has been removed. Electronically Signed   By: Delanna Ahmadi M.D.   On: 10/12/2021 10:39   DG Abd 1 View  Result Date: 10/11/2021 CLINICAL DATA:  NG tube placement EXAM: ABDOMEN - 1 VIEW COMPARISON:  10/10/2021 FINDINGS: There is a weighted feeding tube with tip projecting over the body of the stomach. There are 2 air-filled loops of small bowel which appear nondilated in the right lower quadrant of the abdomen. IMPRESSION: Weighted feeding tube tip projects over the body of the stomach. Electronically Signed   By: Kerby Moors M.D.   On: 10/11/2021 11:41   DG Abd 1 View  Result Date: 10/10/2021 CLINICAL DATA:  Feeding tube placement. EXAM:  ABDOMEN - 1 VIEW COMPARISON:  October 09, 2021 FINDINGS: The bowel gas pattern is normal. Feeding tube is identified distal tip in the proximal stomach. IMPRESSION: Feeding tube distal tip in the proximal stomach. Electronically Signed   By: Abelardo Diesel M.D.   On: 10/10/2021 13:43   CT Abdomen Pelvis W Contrast  Result Date: 10/06/2021 CLINICAL DATA:  Wound on buttocks, sepsis EXAM: CT ABDOMEN AND PELVIS WITH CONTRAST TECHNIQUE: Multidetector CT imaging of the abdomen and pelvis was performed using the standard protocol following bolus administration of intravenous contrast. CONTRAST:  76m OMNIPAQUE IOHEXOL 350 MG/ML SOLN COMPARISON:  09/10/2020 FINDINGS: Lower chest: No acute pleural or parenchymal lung disease. Hepatobiliary: Diffuse hepatic steatosis. No focal liver abnormality. The gallbladder is moderately distended without evidence of cholelithiasis or cholecystitis. Pancreas: Unremarkable. No pancreatic ductal dilatation or surrounding inflammatory changes. Spleen: Normal in size without focal abnormality. Adrenals/Urinary  Tract: There is gas within the bladder lumen, as well as a small amount of intramural gas seen dependently within the bladder. Findings could reflect emphysematous cystitis or recent instrumentation of the bladder. No bladder wall thickening or perivesicular fat stranding. There is a small amount of gas within the bilateral renal collecting systems, right greater than left, likely related to the gas in the bladder described above. The kidneys enhance normally and symmetrically, with no other findings to suggest infection or pyelonephritis. No urinary tract calculi or obstructive uropathy. The adrenals are unremarkable. Stomach/Bowel: No bowel obstruction or ileus. Normal appendix right lower quadrant. No bowel wall thickening or inflammatory change. Vascular/Lymphatic: Borderline enlarged lymph nodes are seen within the bilateral inguinal regions and pelvis. Largest lymph node in  the right external iliac chain measures up to 18 mm in short axis. Smaller subcentimeter lymph nodes are seen within the retroperitoneum. These may be reactive. No significant vascular findings. Reproductive: Prostate is unremarkable. Other: No free fluid or free intraperitoneal gas. Fat containing umbilical hernia. No bowel herniation. Musculoskeletal: Subcutaneous edema is seen dependently within the suprapubic region, bilateral flanks and buttocks. Small foci of subcutaneous gas within the right buttock could be related to infection or recent injection. No fluid collection or abscess. There are no acute or destructive bony lesions. 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 IMPRESSION: 1. Abnormal gas within the bilateral renal collecting systems, urinary bladder, and dependent bladder wall. Given the lack of associated inflammatory changes within the kidneys or bladder, I would favor recent instrumentation over acute urinary tract infection and emphysematous cystitis. Please correlate with procedural history. 2. Subcutaneous edema within the suprapubic region, bilateral flanks, and buttocks, with small foci of subcutaneous gas in the right buttock as above. Findings could reflect cellulitis. No fluid collection or abscess. 3. Chronic retroperitoneal and pelvic adenopathy. 4. 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 5. Hepatic steatosis. 6. Fat containing umbilical hernia. Electronically Signed   By: Randa Ngo M.D.   On: 10/06/2021 20:41   DG CHEST PORT 1 VIEW  Result Date: 10/26/2021 CLINICAL DATA:  Aspiration into airway. EXAM: PORTABLE CHEST 1 VIEW COMPARISON:  Chest x-ray 10/13/2021. FINDINGS: Right-sided central venous catheter tip projects over the distal SVC, unchanged. The lungs and costophrenic angles are clear. There is no pneumothorax. Cardiomediastinal  silhouette is within normal limits. No acute fractures are seen. IMPRESSION: 1. No acute cardiopulmonary process. Electronically Signed   By: Ronney Asters M.D.   On: 10/26/2021 21:02   DG CHEST PORT 1 VIEW  Result Date: 10/13/2021 CLINICAL DATA:  History of pneumonia. EXAM: PORTABLE CHEST 1 VIEW COMPARISON:  10/10/2021. FINDINGS: Interim removal of NG tube and placement of feeding tube. Feeding tube tip below left hemidiaphragm. Right IJ line tip again noted over the lateral portion of the right atrium. Retraction of approximately 6 cm should be considered. Low lung volumes with bilateral pulmonary infiltrates/edema. No pleural effusion or pneumothorax. IMPRESSION: 1. Interim removal of NG tube and placement of feeding tube. Feeding tube tip below left hemidiaphragm. 2. Right IJ line tip again noted over the lateral portion of the right atrium. Retraction of approximately 6 cm should be considered. 3.  Low lung volumes with bilateral pulmonary infiltrates/edema. Electronically Signed   By: Marcello Moores  Register M.D.   On: 10/13/2021 06:32   DG CHEST PORT 1 VIEW  Result Date: 10/10/2021 CLINICAL DATA:  Encounter for central line placement.  EXAM: PORTABLE CHEST 1 VIEW COMPARISON:  10/09/2021 FINDINGS: There is a right jugular central line and the tip extends into the right atrium and pointed laterally. Central line position has not significantly changed. Negative for a pneumothorax. Nasogastric tube extends into the abdomen but the tip is beyond the image. Mild diffuse interstitial lung densities unchanged. Heart size is normal. IMPRESSION: 1. Stable position of the right jugular central line. Central line tip appears to be in the right atrium and pointing towards the lateral wall. Consider retracting the central line 2-3 cm for more optimal positioning. 2. Persistent interstitial lung densities in both lungs. Findings could be related to vascular congestion or mild edema. Electronically Signed   By: Markus Daft M.D.    On: 10/10/2021 09:03   DG CHEST PORT 1 VIEW  Result Date: 10/09/2021 CLINICAL DATA:  Per order: aspiration into airway. Hx of HTN. EXAM: PORTABLE CHEST 1 VIEW COMPARISON:  10/07/2021 and older exams. FINDINGS: Cardiac silhouette normal in size. Central vascular congestion. Mild hazy airspace opacities also noted centrally. No convincing pleural effusion and no pneumothorax. Right internal jugular central venous line tip now projects along the right lateral margin of the right atrium. IMPRESSION: 1. Vascular congestion with mild central hazy airspace opacities suggests pulmonary edema developing since the prior chest radiograph. Findings are accentuated by lower lung volumes on the current exam. Electronically Signed   By: Lajean Manes M.D.   On: 10/09/2021 12:53   DG CHEST PORT 1 VIEW  Result Date: 10/07/2021 CLINICAL DATA:  Central line placement EXAM: PORTABLE CHEST 1 VIEW COMPARISON:  10/07/2021 FINDINGS: CVC with tip overlying the right atrium. Unchanged cardiac and mediastinal silhouette. No focal pulmonary opacity. Elevation of the right hemidiaphragm. No pleural effusion or pneumothorax. No acute osseous abnormality. IMPRESSION: No acute cardiopulmonary process. Electronically Signed   By: Merilyn Baba M.D.   On: 10/07/2021 11:03   DG Chest Port 1 View  Result Date: 10/07/2021 CLINICAL DATA:  Sore on buttocks, fatigue, central line placement EXAM: PORTABLE CHEST 1 VIEW COMPARISON:  10/06/2021 FINDINGS: Single frontal view of the chest demonstrates right internal jugular dialysis catheter tip overlying the atriocaval junction. Cardiac silhouette is stable. No airspace disease, effusion, or pneumothorax. No acute bony abnormalities. IMPRESSION: 1. No complication after right internal jugular catheter placement. 2. No acute process. Electronically Signed   By: Randa Ngo M.D.   On: 10/07/2021 01:05   DG Chest Port 1 View  Result Date: 10/06/2021 CLINICAL DATA:  Possible sepsis EXAM:  PORTABLE CHEST 1 VIEW COMPARISON:  08/23/2021 FINDINGS: The heart size and mediastinal contours are within normal limits. Both lungs are clear. The visualized skeletal structures are unremarkable. IMPRESSION: No active disease. Electronically Signed   By: Elmer Picker M.D.   On: 10/06/2021 18:53   DG Abd Portable 1V  Result Date: 10/09/2021 CLINICAL DATA:  NG tube placement EXAM: PORTABLE ABDOMEN - 1 VIEW COMPARISON:  CT, 10/06/2021 FINDINGS: Nasal/orogastric tube passes well below the diaphragm, tip projecting suspected mid stomach. Normal bowel gas pattern IMPRESSION: Well-positioned nasal/orogastric tube. Electronically Signed   By: Lajean Manes M.D.   On: 10/09/2021 12:54   DG Swallowing Func-Speech Pathology  Result Date: 11/02/2021 Table formatting from the original result was not included. Objective Swallowing Evaluation: Type of Study: MBS-Modified Barium Swallow Study  Patient Details Name: AADYN BUCHHEIT MRN: [id number] Date of Birth: 1986-02-14 Today's Date: 11/02/2021 Time: SLP Start Time (ACUTE ONLY): 0805 -SLP Stop Time (ACUTE ONLY): 7096 SLP Time Calculation (min) (  ACUTE ONLY): 30 min Past Medical History: Past Medical History: Diagnosis Date  Bipolar 1 disorder ([place])   Depression   Dizziness and giddiness 02/01/2016  Herpes genitalia   HIV disease ([place])   Hypertension   Hyponatremia   Hypothermia 08/24/2021  Migraine headache 02/01/2016  Peripheral neuropathy 10/01/2019  PTSD (post-traumatic stress disorder)   Schizoaffective disorder ([place])   Seizures ([place])  Past Surgical History: Past Surgical History: Procedure Laterality Date  BACK SURGERY    BIOPSY  02/26/2021  Procedure: BIOPSY;  Surgeon: Otis Brace, MD;  Location: [facility];  Service: Gastroenterology;;  COLONOSCOPY WITH PROPOFOL [name] 02/26/2021  Procedure: COLONOSCOPY WITH PROPOFOL;  Surgeon: Otis Brace, MD;  Location: [facility];  Service: Gastroenterology;  Laterality: [name];  ESOPHAGOGASTRODUODENOSCOPY (EGD) WITH  PROPOFOL [name] 02/26/2021  Procedure: ESOPHAGOGASTRODUODENOSCOPY (EGD) WITH PROPOFOL;  Surgeon: Otis Brace, MD;  Location: [facility];  Service: Gastroenterology;  Laterality: [name];  ESOPHAGOGASTRODUODENOSCOPY (EGD) WITH PROPOFOL [name] 05/18/2021  Procedure: ESOPHAGOGASTRODUODENOSCOPY (EGD) WITH PROPOFOL;  Surgeon: Otis Brace, MD;  Location: [facility];  Service: Gastroenterology;  Laterality: [name];  HAND SURGERY   HPI: 36 y.o. male with hx of controlled HIV on medications, bipolar disorder, schizophrenia presented to the ED via EMS with complaints of sore on his buttocks. Dx sepsis. Hx of dysphagia and aspiration during prior admissions. Voice has been documented as weak/hoarse with low volume.  MBS 10/30/19 revealed pharyngeal dysphagia with intermittent aspiration of large boluses of thin liquids - dysphagia at that time was attributed to effects of COVID on respiratory/swallowing sequence. Repeat MBS 05/08/21 showed similar deficits with delay of swallow onset after materials were sitting in the pharynx, leading to spillover into the airway. During both studies, modifying bolus size helped to prevent aspiration. His swallow was evaluated again at the bedside on 08/25/20, clinical impression was unchanged. When taking smaller sips of liquids there were no instances of coughing.  On morning of 12/30, pt was observed to likely aspirate applesauce, hence consult placed for SLP swallow eval.  Subjective: pt alert  Recommendations for follow up therapy are one component of a multi-disciplinary discharge planning process, led by the attending physician.  Recommendations may be updated based on patient status, additional functional criteria and insurance authorization. Assessment / Plan / Recommendation Clinical Impressions 11/02/2021 Clinical Impression Pt tested with nectar and thin barium only as he has not had issues with solids/puree in the past.  He demonstrates delayed swallow trigger to pyriform sinus at  latest allowing laryngeal penetration and trace inconsistent aspiration of thin liquids with reflexive cough.      HOB reclined with controlled bolus effective to prevent aspiration but pt did require total cueing to maintain posture.  No aspiration or penetration of nectar observed.  Pt also tested with HOB neutral and with/without chin tuck posture.  HOB reclined decreased amount of laryngeal penetration significantly.  Of note, pt also took very small boluses despite verbal cues to take larger sips for increased accuracy/efficacy of testing.  SLP advises to continue nectar liquids for now and SLP will follow up for dysphagia, compensation training. He is fully alert today and cooperative.Using teach back, educated pt to recommendations.  Today pt is fully awake and voice is clear - which was not encountered SLP Visit Diagnosis Dysphagia, oropharyngeal phase (R13.12) Attention and concentration deficit following -- Frontal lobe and executive function deficit following -- Impact on safety and function Mild aspiration risk   Treatment Recommendations 11/02/2021 Treatment Recommendations Therapy as outlined in treatment plan below   Prognosis  11/02/2021 Prognosis for Safe Diet Advancement Fair Barriers to Reach Goals Cognitive deficits Barriers/Prognosis Comment -- Diet Recommendations 11/02/2021 SLP Diet Recommendations Regular solids;Nectar thick liquid Liquid Administration via -- Medication Administration Whole meds with puree Compensations Slow rate;Small sips/bites Postural Changes --   Other Recommendations 11/02/2021 Recommended Consults -- Oral Care Recommendations Oral care BID Other Recommendations -- Follow Up Recommendations Skilled nursing-short term rehab (<3 hours/day) Assistance recommended at discharge -- Functional Status Assessment Patient has had a recent decline in their functional status and/or demonstrates limited ability to make significant improvements in function in a reasonable and predictable  amount of time Frequency and Duration  11/02/2021 Speech Therapy Frequency (ACUTE ONLY) min 2x/week Treatment Duration 2 weeks   Oral Phase 11/02/2021 Oral Phase -- Oral - Pudding Teaspoon -- Oral - Pudding Cup -- Oral - Honey Teaspoon -- Oral - Honey Cup -- Oral - Nectar Teaspoon Delayed oral transit;Decreased bolus cohesion Oral - Nectar Cup Delayed oral transit;Decreased bolus cohesion Oral - Nectar Straw Delayed oral transit;Decreased bolus cohesion Oral - Thin Teaspoon Decreased bolus cohesion;Delayed oral transit Oral - Thin Cup Delayed oral transit;Decreased bolus cohesion Oral - Thin Straw Delayed oral transit;Decreased bolus cohesion Oral - Puree NT Oral - Mech Soft -- Oral - Regular NT Oral - Multi-Consistency -- Oral - Pill -- Oral Phase - Comment --  Pharyngeal Phase 11/02/2021 Pharyngeal Phase -- Pharyngeal- Pudding Teaspoon -- Pharyngeal -- Pharyngeal- Pudding Cup -- Pharyngeal -- Pharyngeal- Honey Teaspoon -- Pharyngeal -- Pharyngeal- Honey Cup -- Pharyngeal -- Pharyngeal- Nectar Teaspoon Delayed swallow initiation-pyriform sinuses Pharyngeal -- Pharyngeal- Nectar Cup Delayed swallow initiation-pyriform sinuses Pharyngeal -- Pharyngeal- Nectar Straw Delayed swallow initiation-pyriform sinuses Pharyngeal -- Pharyngeal- Thin Teaspoon Delayed swallow initiation-pyriform sinuses Pharyngeal -- Pharyngeal- Thin Cup Delayed swallow initiation-pyriform sinuses Pharyngeal -- Pharyngeal- Thin Straw Delayed swallow initiation-pyriform sinuses Pharyngeal Material does not enter airway Pharyngeal- Puree NT Pharyngeal -- Pharyngeal- Mechanical Soft -- Pharyngeal -- Pharyngeal- Regular NT Pharyngeal -- Pharyngeal- Multi-consistency -- Pharyngeal -- Pharyngeal- Pill -- Pharyngeal -- Pharyngeal Comment --  Cervical Esophageal Phase  11/02/2021 Cervical Esophageal Phase WFL Pudding Teaspoon -- Pudding Cup -- Honey Teaspoon -- Honey Cup -- Nectar Teaspoon -- Nectar Cup -- Nectar Straw -- Thin Teaspoon -- Thin Cup -- Thin  Straw -- Puree -- Mechanical Soft -- Regular -- Multi-consistency -- Pill -- Cervical Esophageal Comment -- Kathleen Lime, MS [facility] SLP Acute Rehab Services Office [phone] Cell [phone] [name] 11/02/2021, 1:18 PM                     DG Swallowing Func-Speech Pathology  Result Date: 10/21/2021 Table formatting from the original result was not included. Objective Swallowing Evaluation: Type of Study: MBS-Modified Barium Swallow Study  Patient Details Name: BINH DOTEN MRN: [id number] Date of Birth: 01-04-1986 Today's Date: 10/21/2021 Time: SLP Start Time (ACUTE ONLY): 1035 -SLP Stop Time (ACUTE ONLY): 1117 SLP Time Calculation (min) (ACUTE ONLY): 42 min Past Medical History: Past Medical History: Diagnosis Date  Bipolar 1 disorder ([place])   Depression   Dizziness and giddiness 02/01/2016  Herpes genitalia   HIV disease ([place])   Hypertension   Hyponatremia   Hypothermia 08/24/2021  Migraine headache 02/01/2016  Peripheral neuropathy 10/01/2019  PTSD (post-traumatic stress disorder)   Schizoaffective disorder ([place])   Seizures ([place])  Past Surgical History: Past Surgical History: Procedure Laterality Date  BACK SURGERY    BIOPSY  02/26/2021  Procedure: BIOPSY;  Surgeon: Otis Brace, MD;  Location: [facility];  Service: Gastroenterology;;  COLONOSCOPY WITH PROPOFOL [name] 02/26/2021  Procedure: COLONOSCOPY WITH PROPOFOL;  Surgeon: Otis Brace, MD;  Location: [facility];  Service: Gastroenterology;  Laterality: [name];  ESOPHAGOGASTRODUODENOSCOPY (EGD) WITH PROPOFOL [name] 02/26/2021  Procedure: ESOPHAGOGASTRODUODENOSCOPY (EGD) WITH PROPOFOL;  Surgeon: Otis Brace, MD;  Location: [facility];  Service: Gastroenterology;  Laterality: [name];  ESOPHAGOGASTRODUODENOSCOPY (EGD) WITH PROPOFOL [name] 05/18/2021  Procedure: ESOPHAGOGASTRODUODENOSCOPY (EGD) WITH PROPOFOL;  Surgeon: Otis Brace, MD;  Location: [facility];  Service: Gastroenterology;  Laterality: [name];  HAND SURGERY   HPI: 36 y.o. male with hx  of controlled HIV on medications, bipolar disorder, schizophrenia presented to the ED via EMS with complaints of sore on his buttocks. Dx sepsis. Hx of dysphagia and aspiration during prior admissions. Voice has been documented as weak/hoarse with low volume.  MBS 10/30/19 revealed pharyngeal dysphagia with intermittent aspiration of large boluses of thin liquids - dysphagia at that time was attributed to effects of COVID on respiratory/swallowing sequence. Repeat MBS 05/08/21 showed similar deficits with delay of swallow onset after materials were sitting in the pharynx, leading to spillover into the airway. During both studies, modifying bolus size helped to prevent aspiration. His swallow was evaluated again at the bedside on 08/25/20, clinical impression was unchanged. When taking smaller sips of liquids there were no instances of coughing.  On morning of 12/30, pt was observed to likely aspirate applesauce, hence consult placed for SLP swallow eval.  Subjective: pt alert  Recommendations for follow up therapy are one component of a multi-disciplinary discharge planning process, led by the attending physician.  Recommendations may be updated based on patient status, additional functional criteria and insurance authorization. Assessment / Plan / Recommendation Clinical Impressions 10/21/2021 Clinical Impression --Pt presents with a moderate oropharyngeal dysphagia, with high risk of aspiration given waking and waning mental status, restlessness, decreased strength and efficacy of cough. Pt demonstrates decreased bolus cohesion, particularly with liquids, with boluses spilling and pooling in pyriform sinuses for 1-2 seonds prior to laryngeal elevation. Pt tolerates small cup sips and teaspoon sips well, but aspirates before the swallow with slightly larger boluses. Cough response does not eject aspirate. Expect that when he is lethargic, frequency and severity of aspiration is worse. Today he is very alert and  following commands. Pt has no significant pharyngeal residue and would tolerate honey thick liquids even better when more altered. For now teaspoon or controlled cup sips of nectar thick liquids and regular solids are recommended to offer pt a wide varity of foods for his comfort. SLP Visit Diagnosis Dysphagia, oropharyngeal phase (R13.12) Attention and concentration deficit following -- Frontal lobe and executive function deficit following -- Impact on safety and function --   Treatment Recommendations 10/15/2021 Treatment Recommendations Therapy as outlined in treatment plan below   Prognosis 10/15/2021 Prognosis for Safe Diet Advancement Fair Barriers to Reach Goals Cognitive deficits Barriers/Prognosis Comment -- Diet Recommendations 10/21/2021 SLP Diet Recommendations -- Liquid Administration via -- Medication Administration -- Compensations Slow rate;Small sips/bites Postural Changes --   Other Recommendations 10/21/2021 Recommended Consults -- Oral Care Recommendations -- Other Recommendations -- Follow Up Recommendations Skilled nursing-short term rehab (<3 hours/day) Assistance recommended at discharge -- Functional Status Assessment -- Frequency and Duration  10/15/2021 Speech Therapy Frequency (ACUTE ONLY) min 2x/week Treatment Duration 2 weeks   Oral Phase 10/15/2021 Oral Phase Impaired Oral - Pudding Teaspoon -- Oral - Pudding Cup -- Oral - Honey Teaspoon -- Oral - Honey Cup -- Oral - Nectar Teaspoon Premature spillage;Decreased bolus cohesion;Lingual/palatal residue Oral -  Nectar Cup Premature spillage;Decreased bolus cohesion;Lingual/palatal residue Oral - Nectar Straw Premature spillage;Decreased bolus cohesion;Lingual/palatal residue Oral - Thin Teaspoon Premature spillage;Decreased bolus cohesion;Lingual/palatal residue Oral - Thin Cup Premature spillage;Decreased bolus cohesion;Lingual/palatal residue Oral - Thin Straw Premature spillage;Decreased bolus cohesion;Lingual/palatal residue Oral - Puree  Premature spillage;Decreased bolus cohesion;Lingual/palatal residue Oral - Mech Soft -- Oral - Regular Premature spillage;Decreased bolus cohesion;Lingual/palatal residue Oral - Multi-Consistency -- Oral - Pill -- Oral Phase - Comment --  Pharyngeal Phase 10/15/2021 Pharyngeal Phase Impaired Pharyngeal- Pudding Teaspoon -- Pharyngeal -- Pharyngeal- Pudding Cup -- Pharyngeal -- Pharyngeal- Honey Teaspoon -- Pharyngeal -- Pharyngeal- Honey Cup -- Pharyngeal -- Pharyngeal- Nectar Teaspoon Delayed swallow initiation-pyriform sinuses Pharyngeal -- Pharyngeal- Nectar Cup Delayed swallow initiation-pyriform sinuses Pharyngeal -- Pharyngeal- Nectar Straw Delayed swallow initiation-pyriform sinuses;Penetration/Aspiration before swallow Pharyngeal Material enters airway, passes BELOW cords and not ejected out despite cough attempt by patient Pharyngeal- Thin Teaspoon Delayed swallow initiation-pyriform sinuses Pharyngeal -- Pharyngeal- Thin Cup Delayed swallow initiation-pyriform sinuses Pharyngeal -- Pharyngeal- Thin Straw Delayed swallow initiation-pyriform sinuses;Penetration/Aspiration before swallow Pharyngeal Material enters airway, passes BELOW cords and not ejected out despite cough attempt by patient Pharyngeal- Puree Delayed swallow initiation-pyriform sinuses Pharyngeal -- Pharyngeal- Mechanical Soft -- Pharyngeal -- Pharyngeal- Regular Delayed swallow initiation-pyriform sinuses Pharyngeal -- Pharyngeal- Multi-consistency -- Pharyngeal -- Pharyngeal- Pill -- Pharyngeal -- Pharyngeal Comment --  Cervical Esophageal Phase  05/08/2021 Cervical Esophageal Phase Impaired Pudding Teaspoon -- Pudding Cup -- Honey Teaspoon -- Honey Cup -- Nectar Teaspoon -- Nectar Cup -- Nectar Straw -- Thin Teaspoon -- Thin Cup -- Thin Straw -- Puree -- Mechanical Soft -- Regular -- Multi-consistency -- Pill Prominent cricopharyngeal segment Cervical Esophageal Comment -- DeBlois, Katherene Ponto 10/21/2021, 12:51 PM                      VAS [place] LOWER EXTREMITY VENOUS (DVT)  Result Date: 10/07/2021  Lower Venous DVT Study Patient Name:  REEGAN BOUFFARD  Date of Exam:   10/07/2021 Medical Rec #: [id number]         Accession #:    [id number] Date of Birth: 07-31-1986         Patient Gender: M Patient Age:   75 years Exam Location:  [facility] Procedure:      VAS [place] LOWER EXTREMITY VENOUS (DVT) Referring Phys: MURALI RAMASWAMY --------------------------------------------------------------------------------  Indications: Edema.  Limitations: Body habitus, poor ultrasound/tissue interface and patient position, restricted mobility, patient unable to cooperate. Comparison Study: 05/08/2021 negative lower extremity venous duplex Performing Technologist: Maudry Mayhew MHA, RDMS, RVT, RDCS  Examination Guidelines: A complete evaluation includes B-mode imaging, spectral Doppler, color Doppler, and power Doppler as needed of all accessible portions of each vessel. Bilateral testing is considered an integral part of a complete examination. Limited examinations for reoccurring indications may be performed as noted. The reflux portion of the exam is performed with the patient in reverse Trendelenburg.  +---------+---------------+---------+-----------+----------+--------------+  RIGHT     Compressibility Phasicity Spontaneity Properties Thrombus Aging  +---------+---------------+---------+-----------+----------+--------------+  CFV       Full            Yes       Yes                                    +---------+---------------+---------+-----------+----------+--------------+  SFJ       Full                                                             +---------+---------------+---------+-----------+----------+--------------+  FV Prox   Full                                                             +---------+---------------+---------+-----------+----------+--------------+  FV Mid    Full                                                              +---------+---------------+---------+-----------+----------+--------------+  FV Distal Full                                                             +---------+---------------+---------+-----------+----------+--------------+  POP       Full            Yes       Yes                                    +---------+---------------+---------+-----------+----------+--------------+  PTV       Full                                                             +---------+---------------+---------+-----------+----------+--------------+  PERO      Full                                                             +---------+---------------+---------+-----------+----------+--------------+   Right Technical Findings: Not visualized segments include PFV, limited evaluation PTV and peroneal veins.  +---------+---------------+---------+-----------+----------+--------------+  LEFT      Compressibility Phasicity Spontaneity Properties Thrombus Aging  +---------+---------------+---------+-----------+----------+--------------+  FV Mid    Full                                                             +---------+---------------+---------+-----------+----------+--------------+  FV Distal Full                                                             +---------+---------------+---------+-----------+----------+--------------+  POP       Full            Yes       Yes                                    +---------+---------------+---------+-----------+----------+--------------+  Left Technical Findings: Not visualized segments include CFV, SFJ, FV prox, PFV, PTV, peroneal veins.   Summary: RIGHT: - There is no evidence of deep vein thrombosis in the lower extremity. However, portions of this examination were limited- see technologist comments above.  - No cystic structure found in the popliteal fossa.  LEFT: - There is no evidence of deep vein thrombosis involving the visualized veins of the left lower extremity. However, this examination  were limited- see technologist comments above.  - No cystic structure found in the popliteal fossa.  *See table(s) above for measurements and observations. Electronically signed by Harold Barban MD on 10/07/2021 at 8:03:45 PM.    Final    [place] EKG SITE RITE  Result Date: 10/13/2021 If Site Rite image not attached, placement could not be confirmed due to current cardiac rhythm.  US Abdomen Limited RUQ (LIVER/GB)  Result Date: 10/07/2021 CLINICAL DATA:  Cirrhosis EXAM: ULTRASOUND ABDOMEN LIMITED RIGHT UPPER QUADRANT COMPARISON:  CT abdomen pelvis 10/06/2021 FINDINGS: Gallbladder: No gallstones or wall thickening visualized. No sonographic Murphy sign noted by sonographer. Common bile duct: Diameter: 2.7 mm Liver: Diffusely increased echogenicity liver without focal liver lesion. Nodular capsular surface the liver suggesting cirrhosis. Small amount of ascites. Portal vein is patent on color Doppler imaging with normal direction of blood flow towards the liver. Other: None. IMPRESSION: Negative for gallstones or biliary dilatation Coarse echotexture liver with nodularity suggestive of cirrhosis. Mild ascites. Electronically Signed   By: Franchot Gallo M.D.   On: 10/07/2021 18:53     Subjective: No complaints feels great  Discharge Exam: Vitals:   11/03/21 2012 11/04/21 0625  BP: 103/65 103/63  Pulse: (!) 103 90  Resp:  16  Temp: 98.6 F (37 C) 98.3 F (36.8 C)  SpO2: 99% 97%   Vitals:   11/03/21 0453 11/03/21 2012 11/04/21 0620 11/04/21 0625  BP: 98/60 103/65  103/63  Pulse: 97 (!) 103  90  Resp: 16   16  Temp: 98.8 F (37.1 C) 98.6 F (37 C)  98.3 F (36.8 C)  TempSrc: Oral Oral  Oral  SpO2: 98% 99%  97%  Weight: 84.9 kg  86.5 kg   Height:        General: Pt is alert, awake, not in acute distress Cardiovascular: RRR, S1/S2 +, no rubs, no gallops Respiratory: CTA bilaterally, no wheezing, no rhonchi Abdominal: Soft, NT, ND, bowel sounds + Extremities: no edema, no  cyanosis    The results of significant diagnostics from this hospitalization (including imaging, microbiology, ancillary and laboratory) are listed below for reference.     Microbiology: No results found for this or any previous visit (from the past 240 hour(s)).   Labs: BNP (last 3 results) Recent Labs    05/07/21 1444 05/11/21 0847  BNP 87.8 539.7*   Basic Metabolic Panel: Recent Labs  Lab 10/29/21 1218 10/30/21 0445 10/31/21 0600 11/01/21 0421  NA 134* 138 136 135  K 3.3* 3.3* 3.7 3.8  CL 99 102 102 102  CO2 27 30 29 27   GLUCOSE 110* 89 84 90  BUN 19 17 17 17   CREATININE 1.49* 1.20 1.07 1.05  CALCIUM 8.0* 8.2* 8.4* 8.5*  MG 2.1 1.9 1.8 1.6*   Liver Function Tests: No results for input(s): AST, ALT, ALKPHOS, BILITOT, PROT, ALBUMIN in the last 168 hours. No results for input(s): LIPASE, AMYLASE in the last 168 hours. No results for input(s): AMMONIA in the last 168 hours. CBC: Recent Labs  Lab 10/29/21 1218  10/30/21 0445 10/31/21 0600 11/01/21 0421  WBC 15.1* 15.7* 11.7* 14.4*  NEUTROABS 10.1* 10.7* 7.6 9.6*  HGB 7.8* 7.5* 7.5* 7.6*  HCT 24.8* 24.0* 23.8* 24.4*  MCV 100.4* 101.3* 101.3* 101.7*  PLT 213 215 218 253   Cardiac Enzymes: No results for input(s): CKTOTAL, CKMB, CKMBINDEX, TROPONINI in the last 168 hours. BNP: Invalid input(s): POCBNP CBG: Recent Labs  Lab 11/03/21 0721 11/03/21 1140 11/03/21 1655 11/03/21 2227 11/04/21 0744  GLUCAP 76 97 117* 115* 138*   D-Dimer No results for input(s): DDIMER in the last 72 hours. Hgb A1c No results for input(s): HGBA1C in the last 72 hours. Lipid Profile No results for input(s): CHOL, HDL, LDLCALC, TRIG, CHOLHDL, LDLDIRECT in the last 72 hours. Thyroid function studies No results for input(s): TSH, T4TOTAL, T3FREE, THYROIDAB in the last 72 hours.  Invalid input(s): FREET3 Anemia work up No results for input(s): VITAMINB12, FOLATE, FERRITIN, TIBC, IRON, RETICCTPCT in the last 72  hours. Urinalysis    Component Value Date/Time   COLORURINE YELLOW 10/07/2021 0200   APPEARANCEUR CLEAR 10/07/2021 0200   LABSPEC 1.012 10/07/2021 0200   PHURINE 6.0 10/07/2021 0200   GLUCOSEU NEGATIVE 10/07/2021 0200   HGBUR LARGE (A) 10/07/2021 0200   BILIRUBINUR NEGATIVE 10/07/2021 0200   KETONESUR NEGATIVE 10/07/2021 0200   PROTEINUR NEGATIVE 10/07/2021 0200   UROBILINOGEN 0.2 12/21/2014 1006   NITRITE NEGATIVE 10/07/2021 0200   LEUKOCYTESUR LARGE (A) 10/07/2021 0200   Sepsis Labs Invalid input(s): PROCALCITONIN,  WBC,  LACTICIDVEN Microbiology No results found for this or any previous visit (from the past 240 hour(s)).   SIGNED:   Charlynne Cousins, MD  [facility] 11/04/2021, 8:16 AM Pager   If 7PM-7AM, please contact night-coverage [email or web address] Password [email or web address]

## 2021-11-04 NOTE — TOC Transition Note (Signed)
Transition of Care [facility]) - CM/SW Discharge Note   Patient Details  Name: [name] MRN: [id number] Date of Birth: [birth year]  Transition of Care [facility]) CM/SW Contact:  Lynnell Catalan, RN Phone Number: 11/04/2021, 12:29 PM   Clinical Narrative:    Spoke with pt at bedside for dc planning. Pt states he is ready to go home. He has his house keys on the bedside table and he states he has food in his house. He says he has a cane at home and does not want a RW. Choice offered for home health PT/OT and [place] chosen. [place] liaison alerted of referral.   Final next level of care: Home w Home Health Services Barriers to Discharge: No Barriers Identified   Patient Goals and CMS Choice Patient states their goals for this hospitalization and ongoing recovery are:: To go home CMS Medicare.gov Compare Post Acute Care list provided to:: Patient Choice offered to / list presented to : Patient    HH Arranged: PT, OT HH Agency: [place] Date [place]: 11/04/21 Time [place]: 1227 Representative spoke with at [place]: [place]   Readmission Risk Interventions Readmission Risk Prevention Plan 08/24/2021 08/24/2021 11/01/2019  Transportation Screening Complete - -  PCP or Specialist Appt within 3-5 Days Complete Complete -  Home Care Screening - - Complete  Medication Review (RN CM) - - Complete  HRI or Home Care Consult Complete Complete -  Social Work Consult for [place] Planning/Counseling - Complete -  Palliative Care Screening Complete Complete -  Medication Review Press photographer) Complete Complete -  Some recent data might be hidden

## 2021-12-09 ENCOUNTER — Other Ambulatory Visit: Payer: Self-pay | Admitting: Internal Medicine

## 2021-12-10 LAB — COMPLETE METABOLIC PANEL WITH GFR
AG Ratio: 0.5 (calc) — ABNORMAL LOW (ref 1.0–2.5)
ALT: 5 U/L — ABNORMAL LOW (ref 9–46)
AST: 16 U/L (ref 10–40)
Albumin: 2.3 g/dL — ABNORMAL LOW (ref 3.6–5.1)
Alkaline phosphatase (APISO): 128 U/L (ref 36–130)
BUN/Creatinine Ratio: 2 (calc) — ABNORMAL LOW (ref 6–22)
BUN: 2 mg/dL — ABNORMAL LOW (ref 7–25)
CO2: 24 mmol/L (ref 20–32)
Calcium: 9 mg/dL (ref 8.6–10.3)
Chloride: 98 mmol/L (ref 98–110)
Creat: 0.82 mg/dL (ref 0.60–1.26)
Globulin: 4.8 g/dL (calc) — ABNORMAL HIGH (ref 1.9–3.7)
Glucose, Bld: 69 mg/dL (ref 65–99)
Potassium: 3.7 mmol/L (ref 3.5–5.3)
Sodium: 133 mmol/L — ABNORMAL LOW (ref 135–146)
Total Bilirubin: 0.3 mg/dL (ref 0.2–1.2)
Total Protein: 7.1 g/dL (ref 6.1–8.1)
eGFR: 115 mL/min/{1.73_m2} (ref >=60)

## 2021-12-10 LAB — LIPID PANEL
Cholesterol: 107 mg/dL (ref <200)
HDL: 50 mg/dL (ref >=40)
LDL Cholesterol (Calc): 40 mg/dL (calc)
Non-HDL Cholesterol (Calc): 57 mg/dL (calc) (ref <130)
Total CHOL/HDL Ratio: 2.1 (calc) (ref <5.0)
Triglycerides: 83 mg/dL (ref <150)

## 2021-12-10 LAB — CBC
HCT: 31 % — ABNORMAL LOW (ref 38.5–50.0)
Hemoglobin: 10 g/dL — ABNORMAL LOW (ref 13.2–17.1)
MCH: 31.2 pg (ref 27.0–33.0)
MCHC: 32.3 g/dL (ref 32.0–36.0)
MCV: 96.6 fL (ref 80.0–100.0)
MPV: 9.2 fL (ref 7.5–12.5)
Platelets: 281 10*3/uL (ref 140–400)
RBC: 3.21 10*6/uL — ABNORMAL LOW (ref 4.20–5.80)
RDW: 14.1 % (ref 11.0–15.0)
WBC: 9.6 10*3/uL (ref 3.8–10.8)

## 2021-12-10 LAB — VITAMIN D 25 HYDROXY (VIT D DEFICIENCY, FRACTURES): Vit D, 25-Hydroxy: 36 ng/mL (ref 30–100)

## 2021-12-10 LAB — TSH: TSH: 1.95 mIU/L (ref 0.40–4.50)

## 2022-01-27 ENCOUNTER — Other Ambulatory Visit: Payer: Self-pay | Admitting: Neurology

## 2022-02-13 ENCOUNTER — Inpatient Hospital Stay (HOSPITAL_COMMUNITY)
Admission: EM | Admit: 2022-02-13 | Discharge: 2022-03-04 | DRG: 974 | Disposition: A | Discharge status: Home Health | Payer: Medicare HMO | Attending: Internal Medicine | Admitting: Internal Medicine

## 2022-02-13 ENCOUNTER — Encounter (HOSPITAL_COMMUNITY): Payer: Self-pay

## 2022-02-13 ENCOUNTER — Inpatient Hospital Stay (HOSPITAL_COMMUNITY): Payer: Medicare HMO

## 2022-02-13 ENCOUNTER — Emergency Department (HOSPITAL_COMMUNITY): Payer: Medicare HMO

## 2022-02-13 DIAGNOSIS — Z818 Family history of other mental and behavioral disorders: Secondary | ICD-10-CM

## 2022-02-13 DIAGNOSIS — Z6823 Body mass index (BMI) 23.0-23.9, adult: Secondary | ICD-10-CM | POA: Diagnosis not present

## 2022-02-13 DIAGNOSIS — E43 Unspecified severe protein-calorie malnutrition: Secondary | ICD-10-CM | POA: Diagnosis present

## 2022-02-13 DIAGNOSIS — Z79899 Other long term (current) drug therapy: Secondary | ICD-10-CM

## 2022-02-13 DIAGNOSIS — Y92009 Unspecified place in unspecified non-institutional (private) residence as the place of occurrence of the external cause: Secondary | ICD-10-CM | POA: Diagnosis not present

## 2022-02-13 DIAGNOSIS — L89310 Pressure ulcer of right buttock, unstageable: Secondary | ICD-10-CM | POA: Diagnosis present

## 2022-02-13 DIAGNOSIS — R569 Unspecified convulsions: Secondary | ICD-10-CM | POA: Diagnosis present

## 2022-02-13 DIAGNOSIS — R6521 Severe sepsis with septic shock: Secondary | ICD-10-CM | POA: Diagnosis present

## 2022-02-13 DIAGNOSIS — R9431 Abnormal electrocardiogram [ECG] [EKG]: Secondary | ICD-10-CM | POA: Diagnosis not present

## 2022-02-13 DIAGNOSIS — L89150 Pressure ulcer of sacral region, unstageable: Secondary | ICD-10-CM | POA: Diagnosis present

## 2022-02-13 DIAGNOSIS — D6959 Other secondary thrombocytopenia: Secondary | ICD-10-CM | POA: Diagnosis present

## 2022-02-13 DIAGNOSIS — F4310 Post-traumatic stress disorder, unspecified: Secondary | ICD-10-CM | POA: Diagnosis present

## 2022-02-13 DIAGNOSIS — R54 Age-related physical debility: Secondary | ICD-10-CM | POA: Diagnosis present

## 2022-02-13 DIAGNOSIS — R627 Adult failure to thrive: Secondary | ICD-10-CM | POA: Diagnosis not present

## 2022-02-13 DIAGNOSIS — W06XXXA Fall from bed, initial encounter: Secondary | ICD-10-CM | POA: Diagnosis present

## 2022-02-13 DIAGNOSIS — L89320 Pressure ulcer of left buttock, unstageable: Secondary | ICD-10-CM | POA: Diagnosis present

## 2022-02-13 DIAGNOSIS — Z91148 Patient's other noncompliance with medication regimen for other reason: Secondary | ICD-10-CM

## 2022-02-13 DIAGNOSIS — B20 Human immunodeficiency virus [HIV] disease: Secondary | ICD-10-CM | POA: Diagnosis present

## 2022-02-13 DIAGNOSIS — R21 Rash and other nonspecific skin eruption: Secondary | ICD-10-CM | POA: Diagnosis present

## 2022-02-13 DIAGNOSIS — R1310 Dysphagia, unspecified: Secondary | ICD-10-CM | POA: Diagnosis present

## 2022-02-13 DIAGNOSIS — R6520 Severe sepsis without septic shock: Secondary | ICD-10-CM | POA: Diagnosis not present

## 2022-02-13 DIAGNOSIS — M879 Osteonecrosis, unspecified: Secondary | ICD-10-CM | POA: Diagnosis present

## 2022-02-13 DIAGNOSIS — F1090 Alcohol use, unspecified, uncomplicated: Secondary | ICD-10-CM | POA: Diagnosis not present

## 2022-02-13 DIAGNOSIS — Z66 Do not resuscitate: Secondary | ICD-10-CM | POA: Diagnosis not present

## 2022-02-13 DIAGNOSIS — Z20822 Contact with and (suspected) exposure to covid-19: Secondary | ICD-10-CM | POA: Diagnosis present

## 2022-02-13 DIAGNOSIS — I959 Hypotension, unspecified: Secondary | ICD-10-CM | POA: Diagnosis present

## 2022-02-13 DIAGNOSIS — Z882 Allergy status to sulfonamides status: Secondary | ICD-10-CM

## 2022-02-13 DIAGNOSIS — G629 Polyneuropathy, unspecified: Secondary | ICD-10-CM | POA: Diagnosis present

## 2022-02-13 DIAGNOSIS — I10 Essential (primary) hypertension: Secondary | ICD-10-CM | POA: Diagnosis present

## 2022-02-13 DIAGNOSIS — L732 Hidradenitis suppurativa: Secondary | ICD-10-CM | POA: Diagnosis present

## 2022-02-13 DIAGNOSIS — Z7189 Other specified counseling: Secondary | ICD-10-CM | POA: Diagnosis not present

## 2022-02-13 DIAGNOSIS — K7030 Alcoholic cirrhosis of liver without ascites: Secondary | ICD-10-CM | POA: Diagnosis not present

## 2022-02-13 DIAGNOSIS — J9811 Atelectasis: Secondary | ICD-10-CM | POA: Diagnosis present

## 2022-02-13 DIAGNOSIS — Z811 Family history of alcohol abuse and dependence: Secondary | ICD-10-CM

## 2022-02-13 DIAGNOSIS — E876 Hypokalemia: Secondary | ICD-10-CM | POA: Diagnosis present

## 2022-02-13 DIAGNOSIS — F251 Schizoaffective disorder, depressive type: Secondary | ICD-10-CM | POA: Diagnosis present

## 2022-02-13 DIAGNOSIS — F17210 Nicotine dependence, cigarettes, uncomplicated: Secondary | ICD-10-CM | POA: Diagnosis present

## 2022-02-13 DIAGNOSIS — F310 Bipolar disorder, current episode hypomanic: Secondary | ICD-10-CM | POA: Diagnosis not present

## 2022-02-13 DIAGNOSIS — R64 Cachexia: Secondary | ICD-10-CM | POA: Diagnosis present

## 2022-02-13 DIAGNOSIS — F1010 Alcohol abuse, uncomplicated: Secondary | ICD-10-CM | POA: Diagnosis present

## 2022-02-13 DIAGNOSIS — G9341 Metabolic encephalopathy: Secondary | ICD-10-CM | POA: Diagnosis present

## 2022-02-13 DIAGNOSIS — Z515 Encounter for palliative care: Secondary | ICD-10-CM | POA: Diagnosis not present

## 2022-02-13 DIAGNOSIS — A419 Sepsis, unspecified organism: Principal | ICD-10-CM | POA: Diagnosis present

## 2022-02-13 DIAGNOSIS — F319 Bipolar disorder, unspecified: Secondary | ICD-10-CM | POA: Diagnosis present

## 2022-02-13 DIAGNOSIS — Z888 Allergy status to other drugs, medicaments and biological substances status: Secondary | ICD-10-CM

## 2022-02-13 DIAGNOSIS — M25552 Pain in left hip: Secondary | ICD-10-CM | POA: Diagnosis present

## 2022-02-13 DIAGNOSIS — R531 Weakness: Secondary | ICD-10-CM | POA: Diagnosis not present

## 2022-02-13 DIAGNOSIS — D638 Anemia in other chronic diseases classified elsewhere: Secondary | ICD-10-CM | POA: Diagnosis present

## 2022-02-13 DIAGNOSIS — E8809 Other disorders of plasma-protein metabolism, not elsewhere classified: Secondary | ICD-10-CM | POA: Diagnosis present

## 2022-02-13 DIAGNOSIS — Z8619 Personal history of other infectious and parasitic diseases: Secondary | ICD-10-CM

## 2022-02-13 DIAGNOSIS — R590 Localized enlarged lymph nodes: Secondary | ICD-10-CM | POA: Diagnosis present

## 2022-02-13 LAB — CBC WITH DIFFERENTIAL/PLATELET
Abs Immature Granulocytes: 0.07 10*3/uL (ref 0.00–0.07)
Basophils Absolute: 0 10*3/uL (ref 0.0–0.1)
Basophils Relative: 0 %
Eosinophils Absolute: 0 10*3/uL (ref 0.0–0.5)
Eosinophils Relative: 0 %
HCT: 36.7 % — ABNORMAL LOW (ref 39.0–52.0)
Hemoglobin: 11.5 g/dL — ABNORMAL LOW (ref 13.0–17.0)
Immature Granulocytes: 1 %
Lymphocytes Relative: 14 %
Lymphs Abs: 1.8 10*3/uL (ref 0.7–4.0)
MCH: 30.3 pg (ref 26.0–34.0)
MCHC: 31.3 g/dL (ref 30.0–36.0)
MCV: 96.8 fL (ref 80.0–100.0)
Monocytes Absolute: 0.5 10*3/uL (ref 0.1–1.0)
Monocytes Relative: 4 %
Neutro Abs: 10.8 10*3/uL — ABNORMAL HIGH (ref 1.7–7.7)
Neutrophils Relative %: 81 %
Platelets: 149 10*3/uL — ABNORMAL LOW (ref 150–400)
RBC: 3.79 MIL/uL — ABNORMAL LOW (ref 4.22–5.81)
RDW: 17.2 % — ABNORMAL HIGH (ref 11.5–15.5)
WBC: 13.2 10*3/uL — ABNORMAL HIGH (ref 4.0–10.5)
nRBC: 0 % (ref 0.0–0.2)

## 2022-02-13 LAB — MRSA NEXT GEN BY PCR, NASAL: MRSA by PCR Next Gen: DETECTED — AB

## 2022-02-13 LAB — COMPREHENSIVE METABOLIC PANEL
ALT: 13 U/L (ref 0–44)
AST: 20 U/L (ref 15–41)
Albumin: <1.5 g/dL — ABNORMAL LOW (ref 3.5–5.0)
Alkaline Phosphatase: 128 U/L — ABNORMAL HIGH (ref 38–126)
Anion gap: 4 — ABNORMAL LOW (ref 5–15)
BUN: 6 mg/dL (ref 6–20)
CO2: 30 mmol/L (ref 22–32)
Calcium: 8 mg/dL — ABNORMAL LOW (ref 8.9–10.3)
Chloride: 104 mmol/L (ref 98–111)
Creatinine, Ser: 1.01 mg/dL (ref 0.61–1.24)
GFR, Estimated: >60 mL/min (ref >60)
Glucose, Bld: 94 mg/dL (ref 70–99)
Potassium: 3.4 mmol/L — ABNORMAL LOW (ref 3.5–5.1)
Sodium: 138 mmol/L (ref 135–145)
Total Bilirubin: 0.7 mg/dL (ref 0.3–1.2)
Total Protein: 6 g/dL — ABNORMAL LOW (ref 6.5–8.1)

## 2022-02-13 LAB — RESP PANEL BY RT-PCR (FLU A&B, COVID) ARPGX2
Influenza A by PCR: NEGATIVE
Influenza B by PCR: NEGATIVE
SARS Coronavirus 2 by RT PCR: NEGATIVE

## 2022-02-13 LAB — LACTIC ACID, PLASMA
Lactic Acid, Venous: 2 mmol/L (ref 0.5–1.9)
Lactic Acid, Venous: 2.9 mmol/L (ref 0.5–1.9)

## 2022-02-13 LAB — URINALYSIS, ROUTINE W REFLEX MICROSCOPIC
Bilirubin Urine: NEGATIVE
Glucose, UA: NEGATIVE mg/dL
Ketones, ur: NEGATIVE mg/dL
Nitrite: POSITIVE — AB
Protein, ur: NEGATIVE mg/dL
Specific Gravity, Urine: 1.005 (ref 1.005–1.030)
pH: 6 (ref 5.0–8.0)

## 2022-02-13 LAB — PROTIME-INR
INR: 1.4 — ABNORMAL HIGH (ref 0.8–1.2)
Prothrombin Time: 16.8 seconds — ABNORMAL HIGH (ref 11.4–15.2)

## 2022-02-13 LAB — APTT: aPTT: 33 seconds (ref 24–36)

## 2022-02-13 LAB — AMMONIA: Ammonia: 38 umol/L — ABNORMAL HIGH (ref 9–35)

## 2022-02-13 LAB — PROCALCITONIN: Procalcitonin: <0.1 ng/mL

## 2022-02-13 LAB — SEDIMENTATION RATE: Sed Rate: 60 mm/hr — ABNORMAL HIGH (ref 0–16)

## 2022-02-13 MED ORDER — LORAZEPAM 1 MG PO TABS: 1.0000 mg | ORAL_TABLET | ORAL | Status: AC | PRN

## 2022-02-13 MED ORDER — ORAL CARE MOUTH RINSE
15.0000 mL | Freq: Two times a day (BID) | OROMUCOSAL | Status: DC
  Administered 2022-02-17 – 2022-03-01 (×13): 15 mL via OROMUCOSAL

## 2022-02-13 MED ORDER — SODIUM CHLORIDE 0.9 % IV SOLN
250.0000 mL | INTRAVENOUS | Status: DC
  Administered 2022-02-20: 250 mL via INTRAVENOUS
  Filled 2022-02-13: qty 250

## 2022-02-13 MED ORDER — SODIUM CHLORIDE (PF) 0.9 % IJ SOLN
INTRAMUSCULAR | Status: AC
  Filled 2022-02-13: qty 50

## 2022-02-13 MED ORDER — NOREPINEPHRINE 4 MG/250ML-% IV SOLN
0.0000 ug/min | INTRAVENOUS | Status: DC
  Filled 2022-02-13: qty 250

## 2022-02-13 MED ORDER — THIAMINE HCL 100 MG PO TABS
100.0000 mg | ORAL_TABLET | Freq: Every day | ORAL | Status: DC
  Administered 2022-02-16 – 2022-03-04 (×11): 100 mg via ORAL
  Filled 2022-02-13 (×13): qty 1

## 2022-02-13 MED ORDER — SODIUM CHLORIDE 0.9 % IV SOLN
2.0000 g | Freq: Three times a day (TID) | INTRAVENOUS | Status: DC
  Administered 2022-02-14 – 2022-02-15 (×5): 2 g via INTRAVENOUS
  Filled 2022-02-13 (×5): qty 12.5

## 2022-02-13 MED ORDER — NOREPINEPHRINE 4 MG/250ML-% IV SOLN
2.0000 ug/min | INTRAVENOUS | Status: DC
  Administered 2022-02-14: 2 ug/min via INTRAVENOUS

## 2022-02-13 MED ORDER — POTASSIUM CHLORIDE 10 MEQ/100ML IV SOLN
10.0000 meq | INTRAVENOUS | Status: AC
  Administered 2022-02-13 (×3): 10 meq via INTRAVENOUS
  Filled 2022-02-13 (×3): qty 100

## 2022-02-13 MED ORDER — THIAMINE HCL 100 MG/ML IJ SOLN
100.0000 mg | Freq: Every day | INTRAMUSCULAR | Status: DC
  Administered 2022-02-13 – 2022-02-27 (×5): 100 mg via INTRAVENOUS
  Filled 2022-02-13 (×7): qty 2

## 2022-02-13 MED ORDER — CHLORHEXIDINE GLUCONATE 0.12 % MT SOLN
15.0000 mL | Freq: Two times a day (BID) | OROMUCOSAL | Status: DC
  Administered 2022-02-14 – 2022-03-01 (×19): 15 mL via OROMUCOSAL
  Filled 2022-02-13 (×27): qty 15

## 2022-02-13 MED ORDER — IOHEXOL 300 MG/ML  SOLN
100.0000 mL | Freq: Once | INTRAMUSCULAR | Status: AC | PRN
  Administered 2022-02-13: 100 mL via INTRAVENOUS

## 2022-02-13 MED ORDER — LACTATED RINGERS IV BOLUS
1000.0000 mL | Freq: Once | INTRAVENOUS | Status: AC
Start: 2022-02-13 — End: 2022-02-13
  Administered 2022-02-13: 1000 mL via INTRAVENOUS

## 2022-02-13 MED ORDER — SODIUM CHLORIDE 0.9 % IV SOLN
2.0000 g | Freq: Once | INTRAVENOUS | Status: AC
  Administered 2022-02-13: 2 g via INTRAVENOUS
  Filled 2022-02-13: qty 12.5

## 2022-02-13 MED ORDER — ORAL CARE MOUTH RINSE
15.0000 mL | Freq: Two times a day (BID) | OROMUCOSAL | Status: DC
  Administered 2022-02-14 – 2022-03-01 (×7): 15 mL via OROMUCOSAL

## 2022-02-13 MED ORDER — SODIUM CHLORIDE 0.9 % IV BOLUS
1000.0000 mL | Freq: Once | INTRAVENOUS | Status: AC
  Administered 2022-02-13: 1000 mL via INTRAVENOUS

## 2022-02-13 MED ORDER — LORAZEPAM 2 MG/ML IJ SOLN: 1.0000 mg | INTRAMUSCULAR | Status: AC | PRN

## 2022-02-13 MED ORDER — FOLIC ACID 1 MG PO TABS
1.0000 mg | ORAL_TABLET | Freq: Every day | ORAL | Status: DC
  Administered 2022-02-15 – 2022-03-04 (×16): 1 mg via ORAL
  Filled 2022-02-13 (×17): qty 1

## 2022-02-13 MED ORDER — VANCOMYCIN HCL 2000 MG/400ML IV SOLN
2000.0000 mg | Freq: Once | INTRAVENOUS | Status: AC
  Administered 2022-02-13: 2000 mg via INTRAVENOUS
  Filled 2022-02-13: qty 400

## 2022-02-13 MED ORDER — HYDROCORTISONE SOD SUC (PF) 100 MG IJ SOLR
100.0000 mg | Freq: Once | INTRAMUSCULAR | Status: AC
  Administered 2022-02-13: 100 mg via INTRAVENOUS
  Filled 2022-02-13: qty 2

## 2022-02-13 MED ORDER — CHLORHEXIDINE GLUCONATE CLOTH 2 % EX PADS
6.0000 | MEDICATED_PAD | Freq: Every day | CUTANEOUS | Status: DC
  Administered 2022-02-13 – 2022-03-03 (×17): 6 via TOPICAL

## 2022-02-13 MED ORDER — VANCOMYCIN HCL 1500 MG/300ML IV SOLN
1500.0000 mg | Freq: Two times a day (BID) | INTRAVENOUS | Status: DC
  Administered 2022-02-14 – 2022-02-15 (×3): 1500 mg via INTRAVENOUS
  Filled 2022-02-13 (×5): qty 300

## 2022-02-13 MED ORDER — ENOXAPARIN SODIUM 40 MG/0.4ML IJ SOSY
40.0000 mg | PREFILLED_SYRINGE | INTRAMUSCULAR | Status: DC
  Administered 2022-02-13 – 2022-02-18 (×6): 40 mg via SUBCUTANEOUS
  Filled 2022-02-13 (×6): qty 0.4

## 2022-02-13 MED ORDER — LACTATED RINGERS IV SOLN: INTRAVENOUS | Status: DC

## 2022-02-13 MED ORDER — ADULT MULTIVITAMIN W/MINERALS CH
1.0000 | ORAL_TABLET | Freq: Every day | ORAL | Status: DC
  Administered 2022-02-15 – 2022-03-04 (×16): 1 via ORAL
  Filled 2022-02-13 (×17): qty 1

## 2022-02-13 MED ORDER — LACTATED RINGERS IV BOLUS
1000.0000 mL | Freq: Once | INTRAVENOUS | Status: AC
  Administered 2022-02-13: 1000 mL via INTRAVENOUS

## 2022-02-13 MED ORDER — METRONIDAZOLE 500 MG/100ML IV SOLN
500.0000 mg | Freq: Two times a day (BID) | INTRAVENOUS | Status: DC
  Administered 2022-02-13 – 2022-02-15 (×4): 500 mg via INTRAVENOUS
  Filled 2022-02-13 (×4): qty 100

## 2022-02-13 MED ORDER — HYDROCORTISONE SOD SUC (PF) 100 MG IJ SOLR
50.0000 mg | Freq: Three times a day (TID) | INTRAMUSCULAR | Status: DC
Start: 2022-02-13 — End: 2022-02-14
  Administered 2022-02-13 – 2022-02-14 (×2): 50 mg via INTRAVENOUS
  Filled 2022-02-13 (×2): qty 2

## 2022-02-13 NOTE — ED Provider Notes (Signed)
?[facility] ?Provider Note ? ?CSN: [id number] ?Arrival date & time: 02/13/22 1255 ? ?Chief Complaint(s) ?No chief complaint on file. ? ?HPI ?[name] is a 36 y.o. male with PMH HIV, bipolar 1, schizoaffective disorder, catatonia who presents emergency department for evaluation of fatigue and concern for sepsis.  Patient fell out of bed and was found by his home health aide who called EMS.  Patient found to be hypotensive in the field at 82/40.  Patient arrives alert and oriented answering questions appropriately but apparently was found in a disheveled home with multiple on taking home medications and alcohol bottles spread around.  Patient arrives with a wound to the buttocks but denies chest pain, shortness of breath, abdominal pain, nausea, vomiting or other systemic symptoms.  Patient arrives hypotensive and hypothermic to 94.4. ? ? ?Past Medical History ?Past Medical History:  ?Diagnosis Date  ? Bipolar 1 disorder ([place])   ? Depression   ? Dizziness and giddiness 02/01/2016  ? Herpes genitalia   ? HIV disease ([place])   ? Hypertension   ? Hyponatremia   ? Hypothermia 08/24/2021  ? Migraine headache 02/01/2016  ? Peripheral neuropathy 10/01/2019  ? PTSD (post-traumatic stress disorder)   ? Schizoaffective disorder ([place])   ? Seizures ([place])   ? ?Patient Active Problem List  ? Diagnosis Date Noted  ? Malnutrition of moderate degree 10/20/2021  ? Palliative care by specialist   ? DNR (do not resuscitate) 10/14/2021  ? Aspiration pneumonia ([place]) 10/14/2021  ? FTT (failure to thrive) in adult 10/14/2021  ? Dysphagia 10/14/2021  ? Coagulopathy ([place]) 10/14/2021  ? Protein-calorie malnutrition, severe ([place]) 10/09/2021  ?  Class: Chronic  ? Avascular necrosis of femoral head, left ([place]) 10/07/2021  ? Alcoholic cirrhosis of liver without ascites ([place]) 10/07/2021  ? Cellulitis of multiple sites of buttock 10/07/2021  ? Hypotension   ? Hypoxia 08/24/2021  ? Left hip pain   ? Transaminitis    ? Acute metabolic encephalopathy 68/09/7516  ? Cellulitis 05/07/2021  ? Lymphadenopathy 09/29/2020  ? Anemia   ? Upper urinary tract infection   ? Sepsis secondary to UTI ([place]) 03/14/2020  ? Cellulitis of groin 03/14/2020  ? HIV disease ([place])   ? Macrocytic anemia   ? Thrombocytopenia ([place])   ? Prolonged QT interval   ? Acute respiratory failure due to COVID-19 [facility]) 10/27/2019  ? GERD (gastroesophageal reflux disease) 10/27/2019  ? Hypokalemia 10/27/2019  ? Peripheral neuropathy 10/01/2019  ? PTSD (post-traumatic stress disorder) 07/23/2018  ? Dizziness and giddiness 02/01/2016  ? Migraine headache 02/01/2016  ? Schizoaffective disorder, depressive type ([place]) 05/13/2015  ? Severe alcohol dependence ([place]) 05/13/2015  ? Suicidal ideation 01/12/2014  ? ?Home Medication(s) ?Prior to Admission medications   ?Medication Sig Start Date End Date Taking? Authorizing Provider  ?bictegravir-emtricitabine-tenofovir AF (BIKTARVY) 50-200-25 MG TABS tablet Take 1 tablet by mouth daily.  02/24/17   [provider]  ?clonazePAM (KLONOPIN) 0.5 MG tablet Take 1 tablet (0.5 mg total) by mouth 2 (two) times daily. 11/04/21 12/04/21  Charlynne Cousins, MD  ?escitalopram (LEXAPRO) 20 MG tablet Take 1 tablet by mouth daily. 02/16/21   [provider]  ?famotidine (PEPCID) 40 MG tablet Take 40 mg by mouth every morning. 08/19/21   [provider]  ?folic acid (FOLVITE) 1 MG tablet Take 1 mg by mouth daily. 10/03/19   [provider]  ?food thickener (SIMPLYTHICK, NECTAR/LEVEL 2/MILDLY THICK,) GEL Take 1 packet by mouth as needed. 08/31/21  Sheikh, Omair Latif, DO  ?furosemide (LASIX) 40 MG tablet Take 40 mg by mouth daily as needed for fluid. 02/27/20   [provider]  ?HYDROcodone-acetaminophen (NORCO/VICODIN) 5-325 MG tablet Take 1 tablet by mouth 2 (two) times daily as needed. 08/09/21   [provider]  ?lactulose (CHRONULAC) 10 GM/15ML solution Take 30 mLs (20 g total) by mouth 2  (two) times daily. 08/31/21   Raiford Noble Latif, DO  ?magnesium oxide (MAG-OX) 400 (240 Mg) MG tablet Take 1 tablet (400 mg total) by mouth daily. 05/19/21   Geradine Girt, DO  ?Multiple Vitamin (MULTIVITAMIN WITH MINERALS) TABS tablet Take 1 tablet by mouth daily. 09/01/21   Raiford Noble Latif, DO  ?nicotine (NICODERM CQ - DOSED IN MG/24 HOURS) 14 mg/24hr patch Place 1 patch (14 mg total) onto the skin daily. 09/01/21   Raiford Noble Latif, DO  ?OLANZapine zydis (ZYPREXA) 5 MG disintegrating tablet Take 1 tablet (5 mg total) by mouth at bedtime. 11/04/21   Charlynne Cousins, MD  ?ondansetron (ZOFRAN) 8 MG tablet Take 8 mg by mouth every 8 (eight) hours as needed for nausea/vomiting, nausea or vomiting. 10/03/19   [provider]  ?topiramate (TOPAMAX) 100 MG tablet Take 100 mg by mouth at bedtime. 06/18/21   [provider]  ?valACYclovir (VALTREX) 1000 MG tablet Take 1 tablet (1,000 mg total) by mouth daily. 05/15/15   Niel Hummer, NP  ?                                                                                                                                  ?Past Surgical History ?Past Surgical History:  ?Procedure Laterality Date  ? BACK SURGERY    ? BIOPSY  02/26/2021  ? Procedure: BIOPSY;  Surgeon: Otis Brace, MD;  Location: [facility];  Service: Gastroenterology;;  ? COLONOSCOPY WITH PROPOFOL [name] 02/26/2021  ? Procedure: COLONOSCOPY WITH PROPOFOL;  Surgeon: Otis Brace, MD;  Location: [facility];  Service: Gastroenterology;  Laterality: [name];  ? ESOPHAGOGASTRODUODENOSCOPY (EGD) WITH PROPOFOL [name] 02/26/2021  ? Procedure: ESOPHAGOGASTRODUODENOSCOPY (EGD) WITH PROPOFOL;  Surgeon: Otis Brace, MD;  Location: [facility];  Service: Gastroenterology;  Laterality: [name];  ? ESOPHAGOGASTRODUODENOSCOPY (EGD) WITH PROPOFOL [name] 05/18/2021  ? Procedure: ESOPHAGOGASTRODUODENOSCOPY (EGD) WITH PROPOFOL;  Surgeon: Otis Brace, MD;  Location: [facility];  Service:  Gastroenterology;  Laterality: [name];  ? HAND SURGERY    ? ?Family History ?Family History  ?Problem Relation Age of Onset  ? Alcohol abuse Mother   ? Schizophrenia Father   ? Depression Father   ? Alcohol abuse Father   ? Alcohol abuse Paternal Uncle   ? Alcohol abuse Paternal Uncle   ? ? ?Social History ?Social History  ? ?Tobacco Use  ? Smoking status: Every Day  ?  Packs/day: 1.00  ?  Types: Cigarettes  ? Smokeless tobacco: Never  ?Vaping Use  ? Vaping Use: Never used  ?Substance Use Topics  ?  Alcohol use: Yes  ?  Alcohol/week: 14.0 standard drinks  ?  Types: 14 Cans of beer per week  ?  Comment: 5-6 40oz per day  ? Drug use: No  ? ?Allergies ?Dapsone and Primaquine phosphate ? ?Review of Systems ?Review of Systems  ?Constitutional:  Positive for activity change.  ?Skin:  Positive for wound.  ? ?Physical Exam ?Vital Signs  ?I have reviewed the triage vital signs ?BP (!) 91/55   Pulse 94   Temp (!) 94.4 ?F (34.7 ?C) (Rectal)   Resp 17   Ht 6' 3"  (1.905 m)   Wt 86 kg   SpO2 94%   BMI 23.70 kg/m?  ? ?Physical Exam ?Constitutional:   ?   General: He is in acute distress.  ?   Appearance: Normal appearance.  ?   Comments: Cachectic  ?HENT:  ?   Head: Normocephalic and atraumatic.  ?   Nose: No congestion or rhinorrhea.  ?Eyes:  ?   General:     ?   Right eye: No discharge.     ?   Left eye: No discharge.  ?   Extraocular Movements: Extraocular movements intact.  ?   Pupils: Pupils are equal, round, and reactive to light.  ?Cardiovascular:  ?   Rate and Rhythm: Normal rate and regular rhythm.  ?   Heart sounds: No murmur heard. ?Pulmonary:  ?   Effort: No respiratory distress.  ?   Breath sounds: No wheezing or rales.  ?Abdominal:  ?   General: There is no distension.  ?   Tenderness: There is no abdominal tenderness.  ?Musculoskeletal:     ?   General: Normal range of motion.  ?   Cervical back: Normal range of motion.  ?Skin: ?   General: Skin is warm and dry.  ?   Findings: Rash (Erythematous draining wound to  the buttocks bilaterally) present.  ?Neurological:  ?   General: No focal deficit present.  ?   Mental Status: He is alert.  ? ? ?ED Results and Treatments ?Labs ?(all labs ordered are listed, but only abnormal resu

## 2022-02-13 NOTE — H&P (Addendum)
Rh[facility]  History and Physical    Patient: [name] XLK:[id number] DOB: [birth year] DOA: 02/13/2022 DOS: the patient was seen and examined on 02/13/2022 PCP: Fleet Contras, MD  Patient coming from: Home  Chief Complaint: Found on the floor by home health aide  HPI: [name] is a 36 y.o. male with medical history significant of HIV, bipolar disorder, depression, PTSD, schizoaffective disorder, seizures presented to ED after found by home health aide on the floor.  Patient had a unwitnessed fall out of the bed and was found by the home health aide who called the EMS.  At the time of examination, patient is somnolent, arousable to verbal commands however not able to provide any history.  He is oriented to self and place.  History is based on EMS, ED records.  Patient was found to be hypotensive by EMS with BP 82/40, hypothermic with temp of 94.4 deg F.  Prior EDP note, patient was found disheveled at home with multiple medications and alcohol bottles spread around. Patient was found to have draining wound to the buttocks. In ED, initial temp 94.4 F, improved to 97.2 F. BP 95/67, heart rate 94, respirate 16, O2 sats 97% on 2 L CMET showed sodium 138, potassium 3.4, BUN 6, creatinine 1.0, albumin <1.5 Lactic acid 2.9 WBC 13.2, hemoglobin 11.5, hematocrit 36.7, platelets 149K  In ED, SBP dropped to 60s and patient was given hydrocortisone 100 mg IV x1, multiple fluid boluses, IV vancomycin and cefepime   CT chest abdomen pelvis showed no acute findings in the chest abdomen pelvis.  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  Stable adenopathy over the superficial inguinal and external iliac chain likely related to HIV disease.  Left basilar atelectasis  Review of Systems:  Unable to obtain from the patient due to his mental status  Past Medical History:  Diagnosis Date   Bipolar 1 disorder ([facility])    Depression    Dizziness and giddiness  02/01/2016   Herpes genitalia    HIV disease ([facility])    Hypertension    Hyponatremia    Hypothermia 08/24/2021   Migraine headache 02/01/2016   Peripheral neuropathy 10/01/2019   PTSD (post-traumatic stress disorder)    Schizoaffective disorder ([facility])    Seizures ([facility])    Past Surgical History:  Procedure Laterality Date   BACK SURGERY     BIOPSY  02/26/2021   Procedure: BIOPSY;  Surgeon: Kathi Der, MD;  [facility];  Service: Gastroenterology;;   COLONOSCOPY WITH PROPOFOL [name] 02/26/2021   Procedure: COLONOSCOPY WITH PROPOFOL;  Surgeon: Kathi Der, MD;  [facility];  Service: Gastroenterology;  Laterality: [name];   ESOPHAGOGASTRODUODENOSCOPY (EGD) WITH PROPOFOL [name] 02/26/2021   Procedure: ESOPHAGOGASTRODUODENOSCOPY (EGD) WITH PROPOFOL;  Surgeon: Kathi Der, MD;  [facility];  Service: Gastroenterology;  Laterality: [name];   ESOPHAGOGASTRODUODENOSCOPY (EGD) WITH PROPOFOL [name] 05/18/2021   Procedure: ESOPHAGOGASTRODUODENOSCOPY (EGD) WITH PROPOFOL;  Surgeon: Kathi Der, MD;  [facility];  Service: Gastroenterology;  Laterality: [name];   HAND SURGERY     Social History:  reports that he has been smoking cigarettes. He has been smoking an average of 1 pack per day. He has never used smokeless tobacco. He reports current alcohol use of about 14.0 standard drinks per week. He reports that he does not use drugs.  Allergies  Allergen Reactions   Dapsone Other (See Comments)    Per centricity "G6PD deficient"   Primaquine Phosphate Other (See Comments)  Per Centricity "G6PD deficient"    Family History  Problem Relation Age of Onset   Alcohol abuse Mother    Schizophrenia Father    Depression Father    Alcohol abuse Father    Alcohol abuse Paternal Uncle    Alcohol abuse Paternal Uncle     Prior to Admission medications   Medication Sig Start Date End Date Taking? Authorizing Provider  albuterol (VENTOLIN HFA) 108 (90  Base) MCG/ACT inhaler Inhale 1 puff into the lungs every 6 (six) hours as needed for wheezing or shortness of breath.   Yes [provider]  alprazolam Prudy Feeler) 2 MG tablet Take 2 mg by mouth in the morning, at noon, and at bedtime.   Yes [provider]  amoxicillin-clavulanate (AUGMENTIN) 875-125 MG tablet Take 1 tablet by mouth every 12 (twelve) hours.   Yes [provider]  benzonatate (TESSALON) 100 MG capsule Take 100 mg by mouth 3 (three) times daily as needed for cough.   Yes [provider]  bictegravir-emtricitabine-tenofovir AF (BIKTARVY) 50-200-25 MG TABS tablet Take 1 tablet by mouth daily.  02/24/17  Yes [provider]  cetirizine (ZYRTEC) 10 MG tablet Take 10 mg by mouth daily.   Yes [provider]  famotidine (PEPCID) 40 MG tablet Take 40 mg by mouth every morning. 08/19/21  Yes [provider]  clonazePAM (KLONOPIN) 0.5 MG tablet Take 1 tablet (0.5 mg total) by mouth 2 (two) times daily. 11/04/21 12/04/21  Marinda Elk, MD  escitalopram (LEXAPRO) 20 MG tablet Take 1 tablet by mouth daily. 02/16/21   [provider]  folic acid (FOLVITE) 1 MG tablet Take 1 mg by mouth daily. 10/03/19   [provider]  food thickener (SIMPLYTHICK, NECTAR/LEVEL 2/MILDLY THICK,) GEL Take 1 packet by mouth as needed. 08/31/21   Marguerita Merles Latif, DO  furosemide (LASIX) 40 MG tablet Take 40 mg by mouth daily as needed for fluid. 02/27/20   [provider]  HYDROcodone-acetaminophen (NORCO/VICODIN) 5-325 MG tablet Take 1 tablet by mouth 2 (two) times daily as needed. 08/09/21   [provider]  lactulose (CHRONULAC) 10 GM/15ML solution Take 30 mLs (20 g total) by mouth 2 (two) times daily. 08/31/21   Marguerita Merles Latif, DO  magnesium oxide (MAG-OX) 400 (240 Mg) MG tablet Take 1 tablet (400 mg total) by mouth daily. 05/19/21   Joseph Art, DO  Multiple Vitamin (MULTIVITAMIN WITH MINERALS) TABS tablet  Take 1 tablet by mouth daily. 09/01/21   Sheikh, Omair Latif, DO  nicotine (NICODERM CQ - DOSED IN MG/24 HOURS) 14 mg/24hr patch Place 1 patch (14 mg total) onto the skin daily. 09/01/21   Sheikh, Omair Latif, DO  OLANZapine zydis (ZYPREXA) 5 MG disintegrating tablet Take 1 tablet (5 mg total) by mouth at bedtime. 11/04/21   Marinda Elk, MD  ondansetron (ZOFRAN) 8 MG tablet Take 8 mg by mouth every 8 (eight) hours as needed for nausea/vomiting, nausea or vomiting. 10/03/19   [provider]  topiramate (TOPAMAX) 100 MG tablet Take 100 mg by mouth at bedtime. 06/18/21   [provider]  valACYclovir (VALTREX) 1000 MG tablet Take 1 tablet (1,000 mg total) by mouth daily. 05/15/15   Thermon Leyland, NP    Physical Exam: Vitals:   02/13/22 1530 02/13/22 1600 02/13/22 1633 02/13/22 1656  BP: (!) 90/53 (!) 91/55 (!) 92/51 101/68  Pulse: 93 94 97 95  Resp: (!) 22 17 20 16   Temp:   (!) 97.2  F (36.2 C) (!) 97.2 F (36.2 C)  TempSrc:   Oral Oral  SpO2: 95% 94% 93% 99%  Weight:      Height:       Physical Exam General: Somnolent but arousable, oriented to self and place (hospital), ill-appearing, cachectic Cardiovascular: S1 S2 clear, RRR. No pedal edema b/l Respiratory: CTAB, no wheezing,  Gastrointestinal: Soft, nontender, nondistended, NBS Ext: no pedal edema bilaterally Neuro: does not follow commands Skin: Wound to the buttocks bilaterally Psych: somnolent, but arousable, does not follow commands    Data Reviewed:    Latest Ref Rng & Units 02/13/2022    1:43 PM 12/09/2021   12:00 AM 11/01/2021    4:21 AM  CBC  WBC 4.0 - 10.5 K/uL 13.2   9.6   14.4    Hemoglobin 13.0 - 17.0 g/dL 13.0   86.5   7.6    Hematocrit 39.0 - 52.0 % 36.7   31.0   24.4    Platelets 150 - 400 K/uL 149   281   253         Latest Ref Rng & Units 02/13/2022    2:40 PM 12/09/2021   12:00 AM 11/01/2021    4:21 AM  CMP  Glucose 70 - 99 mg/dL 94   69   90    BUN 6 - 20 mg/dL 6   2   17      Creatinine 0.61 - 1.24 mg/dL 7.84   6.96   2.95    Sodium 135 - 145 mmol/L 138   133   135    Potassium 3.5 - 5.1 mmol/L 3.4   3.7   3.8    Chloride 98 - 111 mmol/L 104   98   102    CO2 22 - 32 mmol/L 30   24   27     Calcium 8.9 - 10.3 mg/dL 8.0   9.0   8.5    Total Protein 6.5 - 8.1 g/dL 6.0   7.1     Total Bilirubin 0.3 - 1.2 mg/dL 0.7   0.3     Alkaline Phos 38 - 126 U/L 128      AST 15 - 41 U/L 20   16     ALT 0 - 44 U/L 13   5          Assessment and Plan:  Severe sepsis -Patient presented with hypotension, hypothermia, leukocytosis, lactic acidosis, hypothermia, possibly also has hypovolemia with poor p.o. intake, hypoalbuminemia, source possibly due to buttocks wound, rule out UTI. -CT chest abdomen pelvis did not show any acute chest or abdominal pathology. -Follow blood cultures, procalcitonin, UA, culture flu/COVID test, random cortisol level -Continue aggressive IV fluid hydration, has received hydrocortisone 100 mg IV x1, taper to 50 mg IV every 8 hours -If no significant improvement in BP, will also consider IV albumin and pressors if needed -Continue IV vancomycin, cefepime, Flagyl, wound care consult  Acute metabolic encephalopathy -Likely due to #1, continue IV fluid hydration -Continue IV antibiotics -Patient had a unwitnessed fall, will obtain CT head -PT OT evaluation once alert and able to follow commands  HIV -Will resume Biktarvy once able to take p.o.  Hypokalemia -Replaced IV  Severe protein calorie malnutrition -Albumin less than 1.5, appears to be cachectic -Nutrition consult once alert and oriented and able to take p.o.  History of bipolar disorder, schizoaffective disorder -Hold psych medications until alert and able to take p.o.  Known history of normocytic  anemia  -H&H currently stable, close to baseline -Hemoglobin 10.0 on 12/09/2021, currently 11.5   History of chronic alcohol use,  history of cirrhosis -Placed on CIWA protocol,  thiamine, folate   Advance Care Planning:   Code Status: Full CODE STATUS, no family member at the bedside  Consults: None  Family Communication: No family member at the bedside  Severity of Illness: The appropriate patient status for this patient is INPATIENT. Inpatient status is judged to be reasonable and necessary in order to provide the required intensity of service to ensure the patient's safety. The patient's presenting symptoms, physical exam findings, and initial radiographic and laboratory data in the context of their chronic comorbidities is felt to place them at high risk for further clinical deterioration. Furthermore, it is not anticipated that the patient will be medically stable for discharge from the hospital within 2 midnights of admission.   * I certify that at the point of admission it is my clinical judgment that the patient will require inpatient hospital care spanning beyond 2 midnights from the point of admission due to high intensity of service, high risk for further deterioration and high frequency of surveillance required.*  Author: Thad Ranger, MD 02/13/2022 5:53 PM  For on call review [email or web address].

## 2022-02-13 NOTE — ED Notes (Signed)
Pt has bear hugger on  ?

## 2022-02-13 NOTE — ED Triage Notes (Signed)
Patient presents to the ed with c/o fall. The was unwitnessed and was found this morning nurse aid that come to help him everyday.  Patient blood pressure was low 82/40. Last BP 96/50, HR 80's, and CBG 92. ?

## 2022-02-13 NOTE — ED Notes (Addendum)
Lactic acid 2.0, MD Kommor notified.  ?

## 2022-02-13 NOTE — Progress Notes (Signed)
PHARMACY -  BRIEF ANTIBIOTIC NOTE  ? ?Pharmacy has received consult(s) for vancomycin and cefepime from an ED provider.  The patient's profile has been reviewed for ht/wt/allergies/indication/available labs.   ? ?One time order(s) placed for vancomycin 2 g + cefepime 2 g ? ?Further antibiotics/pharmacy consults should be ordered by admitting physician if indicated.       ?                ?Thank you, ? ?Tawnya Crook, PharmD, BCPS ?Clinical Pharmacist ?02/13/2022 3:25 PM ? ? ?

## 2022-02-13 NOTE — Progress Notes (Signed)
Pharmacy Antibiotic Note ? ?[name] is a 36 y.o. male with hx HIV presented to the ED on 02/13/2022 s/p fall.  Pharmacy has been consulted to dose vancomycin and cefepime for sepsis. ? ?Today, 02/13/2022: ?- scr 1.01 (crcl >100) ?- wbc 13.2 ?- LA 2.9 ? ?Plan: ?- vancomycin 2000 mg IV x1 given in the ED, then 1500 mg q12h for est AUC 471 ?- cefepime 2gm IV q8h ?____________________________________ ? ?Height: 6' 3"  (190.5 cm) ?Weight: 86 kg (189 lb 9.5 oz) ?IBW/kg (Calculated) : 84.5 ? ?Temp (24hrs), Avg:96.3 ?F (35.7 ?C), Min:94.4 ?F (34.7 ?C), Max:97.2 ?F (36.2 ?C) ? ?Recent Labs  ?Lab 02/13/22 ?1343 02/13/22 ?1440 02/13/22 ?1537  ?WBC 13.2*  --   --   ?CREATININE  --  1.01  --   ?LATICACIDVEN 2.0*  --  2.9*  ?  ?Estimated Creatinine Clearance: 118.5 mL/min (by C-G formula based on SCr of 1.01 mg/dL).   ? ?Allergies  ?Allergen Reactions  ? Dapsone Other (See Comments)  ?  Per centricity "G6PD deficient"  ? Primaquine Phosphate Other (See Comments)  ?  Per Centricity "G6PD deficient"  ? ? ?Thank you for allowing pharmacy to be a part of this patient?s care. ? ?Lynelle Doctor ?02/13/2022 6:16 PM ? ?

## 2022-02-13 NOTE — ED Provider Notes (Signed)
Patient with hypothermia and sepsis.  CT chest and abdomen unremarkable.  Patient's temperature has come up to 97 and his blood pressure was 366 systolic.  I have contacted the hospitalist to admit for sepsis ?  ?Milton Ferguson, MD ?02/13/22 1716 ? ?

## 2022-02-14 ENCOUNTER — Inpatient Hospital Stay: Payer: Self-pay

## 2022-02-14 ENCOUNTER — Other Ambulatory Visit: Payer: Self-pay

## 2022-02-14 ENCOUNTER — Inpatient Hospital Stay (HOSPITAL_COMMUNITY): Payer: Medicare HMO

## 2022-02-14 DIAGNOSIS — B20 Human immunodeficiency virus [HIV] disease: Secondary | ICD-10-CM | POA: Diagnosis not present

## 2022-02-14 DIAGNOSIS — F251 Schizoaffective disorder, depressive type: Secondary | ICD-10-CM | POA: Diagnosis not present

## 2022-02-14 DIAGNOSIS — E43 Unspecified severe protein-calorie malnutrition: Secondary | ICD-10-CM | POA: Diagnosis not present

## 2022-02-14 DIAGNOSIS — G9341 Metabolic encephalopathy: Secondary | ICD-10-CM | POA: Diagnosis not present

## 2022-02-14 LAB — CBC
HCT: 28.6 % — ABNORMAL LOW (ref 39.0–52.0)
Hemoglobin: 9.1 g/dL — ABNORMAL LOW (ref 13.0–17.0)
MCH: 31.3 pg (ref 26.0–34.0)
MCHC: 31.8 g/dL (ref 30.0–36.0)
MCV: 98.3 fL (ref 80.0–100.0)
Platelets: 133 10*3/uL — ABNORMAL LOW (ref 150–400)
RBC: 2.91 MIL/uL — ABNORMAL LOW (ref 4.22–5.81)
RDW: 17.2 % — ABNORMAL HIGH (ref 11.5–15.5)
WBC: 19.2 10*3/uL — ABNORMAL HIGH (ref 4.0–10.5)
nRBC: 0 % (ref 0.0–0.2)

## 2022-02-14 LAB — BASIC METABOLIC PANEL
Anion gap: 6 (ref 5–15)
BUN: 6 mg/dL (ref 6–20)
CO2: 27 mmol/L (ref 22–32)
Calcium: 7.7 mg/dL — ABNORMAL LOW (ref 8.9–10.3)
Chloride: 107 mmol/L (ref 98–111)
Creatinine, Ser: 0.93 mg/dL (ref 0.61–1.24)
GFR, Estimated: >60 mL/min (ref >60)
Glucose, Bld: 94 mg/dL (ref 70–99)
Potassium: 4.1 mmol/L (ref 3.5–5.1)
Sodium: 140 mmol/L (ref 135–145)

## 2022-02-14 LAB — CD4/CD8 (T-HELPER/T-SUPPRESSOR CELL)
CD4 absolute: 960 /uL (ref 400–1790)
CD4%: 53.76 % (ref 33–65)
CD8 T Cell Abs: 492 /uL (ref 190–1000)
CD8tox: 27.54 % (ref 12–40)
Ratio: 1.95 (ref 1.0–3.0)
Total lymphocyte count: 1786 /uL (ref 1000–4000)

## 2022-02-14 LAB — C-REACTIVE PROTEIN: CRP: 9.5 mg/dL — ABNORMAL HIGH (ref <1.0)

## 2022-02-14 LAB — CORTISOL: Cortisol, Plasma: >100 ug/dL

## 2022-02-14 MED ORDER — FAMOTIDINE 20 MG PO TABS
40.0000 mg | ORAL_TABLET | Freq: Every day | ORAL | Status: DC
  Administered 2022-02-14 – 2022-03-04 (×17): 40 mg via ORAL
  Filled 2022-02-14 (×17): qty 2

## 2022-02-14 MED ORDER — SODIUM CHLORIDE 0.9% FLUSH
10.0000 mL | INTRAVENOUS | Status: DC | PRN
  Administered 2022-02-19: 10 mL

## 2022-02-14 MED ORDER — LURASIDONE HCL 40 MG PO TABS
120.0000 mg | ORAL_TABLET | Freq: Every day | ORAL | Status: DC
  Administered 2022-02-14 – 2022-03-04 (×17): 120 mg via ORAL
  Filled 2022-02-14 (×19): qty 3

## 2022-02-14 MED ORDER — LINACLOTIDE 145 MCG PO CAPS
145.0000 ug | ORAL_CAPSULE | Freq: Every day | ORAL | Status: DC
  Administered 2022-02-15 – 2022-03-04 (×15): 145 ug via ORAL
  Filled 2022-02-14 (×19): qty 1

## 2022-02-14 MED ORDER — NOREPINEPHRINE 4 MG/250ML-% IV SOLN: 0.0000 ug/min | INTRAVENOUS | Status: DC

## 2022-02-14 MED ORDER — MIDODRINE HCL 5 MG PO TABS
10.0000 mg | ORAL_TABLET | Freq: Three times a day (TID) | ORAL | Status: DC
  Administered 2022-02-14 – 2022-02-23 (×18): 10 mg via ORAL
  Filled 2022-02-14 (×27): qty 2

## 2022-02-14 MED ORDER — TRAZODONE HCL 100 MG PO TABS
300.0000 mg | ORAL_TABLET | Freq: Every evening | ORAL | Status: DC | PRN
  Administered 2022-02-18 – 2022-03-03 (×9): 300 mg via ORAL
  Filled 2022-02-14 (×11): qty 3

## 2022-02-14 MED ORDER — BICTEGRAVIR-EMTRICITAB-TENOFOV 50-200-25 MG PO TABS
1.0000 | ORAL_TABLET | Freq: Every day | ORAL | Status: DC
  Administered 2022-02-14 – 2022-03-04 (×17): 1 via ORAL
  Filled 2022-02-14 (×19): qty 1

## 2022-02-14 MED ORDER — PANTOPRAZOLE SODIUM 40 MG PO TBEC
40.0000 mg | DELAYED_RELEASE_TABLET | Freq: Every day | ORAL | Status: DC
  Administered 2022-02-15 – 2022-03-04 (×16): 40 mg via ORAL
  Filled 2022-02-14 (×16): qty 1

## 2022-02-14 MED ORDER — SODIUM CHLORIDE 0.9% FLUSH
10.0000 mL | Freq: Two times a day (BID) | INTRAVENOUS | Status: DC
  Administered 2022-02-14: 10 mL
  Administered 2022-02-14: 40 mL
  Administered 2022-02-15: 10 mL
  Administered 2022-02-15: 20 mL
  Administered 2022-02-16 (×2): 10 mL
  Administered 2022-02-17 – 2022-02-18 (×3): 20 mL
  Administered 2022-02-18 – 2022-02-21 (×5): 10 mL
  Administered 2022-02-22 – 2022-02-23 (×2): 20 mL
  Administered 2022-02-24: 10 mL
  Administered 2022-02-25: 20 mL
  Administered 2022-02-26 – 2022-02-28 (×5): 10 mL
  Administered 2022-03-01: 20 mL
  Administered 2022-03-02 – 2022-03-04 (×4): 10 mL

## 2022-02-14 MED ORDER — FLUOXETINE HCL 20 MG PO CAPS
40.0000 mg | ORAL_CAPSULE | Freq: Every day | ORAL | Status: DC
  Administered 2022-02-14 – 2022-03-04 (×17): 40 mg via ORAL
  Filled 2022-02-14 (×17): qty 2

## 2022-02-14 MED ORDER — ALBUMIN HUMAN 25 % IV SOLN
25.0000 g | Freq: Four times a day (QID) | INTRAVENOUS | Status: AC
  Administered 2022-02-14 – 2022-02-15 (×3): 25 g via INTRAVENOUS
  Filled 2022-02-14 (×3): qty 100

## 2022-02-14 MED ORDER — LACTULOSE 10 GM/15ML PO SOLN
20.0000 g | Freq: Two times a day (BID) | ORAL | Status: DC
  Administered 2022-02-14 – 2022-02-27 (×14): 20 g via ORAL
  Filled 2022-02-14 (×24): qty 30

## 2022-02-14 MED ORDER — CLONAZEPAM 0.5 MG PO TABS
0.5000 mg | ORAL_TABLET | Freq: Two times a day (BID) | ORAL | Status: DC
  Administered 2022-02-14 – 2022-03-04 (×28): 0.5 mg via ORAL
  Filled 2022-02-14 (×30): qty 1

## 2022-02-14 MED ORDER — VALACYCLOVIR HCL 500 MG PO TABS
1000.0000 mg | ORAL_TABLET | Freq: Every day | ORAL | Status: DC
  Administered 2022-02-14 – 2022-03-04 (×17): 1000 mg via ORAL
  Filled 2022-02-14 (×19): qty 2

## 2022-02-14 MED ORDER — ALBUTEROL SULFATE (2.5 MG/3ML) 0.083% IN NEBU: 2.5000 mg | INHALATION_SOLUTION | Freq: Four times a day (QID) | RESPIRATORY_TRACT | Status: DC | PRN

## 2022-02-14 MED ORDER — HYDROCORTISONE SOD SUC (PF) 100 MG IJ SOLR
50.0000 mg | Freq: Two times a day (BID) | INTRAMUSCULAR | Status: DC
Start: 2022-02-14 — End: 2022-02-15
  Administered 2022-02-14 – 2022-02-15 (×2): 50 mg via INTRAVENOUS
  Filled 2022-02-14 (×2): qty 2

## 2022-02-14 MED ORDER — HALOPERIDOL 5 MG PO TABS
5.0000 mg | ORAL_TABLET | Freq: Every day | ORAL | Status: DC
  Administered 2022-02-14 – 2022-03-04 (×17): 5 mg via ORAL
  Filled 2022-02-14: qty 1
  Filled 2022-02-14: qty 5
  Filled 2022-02-14 (×9): qty 1
  Filled 2022-02-14: qty 5
  Filled 2022-02-14 (×2): qty 1
  Filled 2022-02-14: qty 5
  Filled 2022-02-14 (×2): qty 1

## 2022-02-14 MED ORDER — DIPHENOXYLATE-ATROPINE 2.5-0.025 MG PO TABS
1.0000 | ORAL_TABLET | Freq: Every day | ORAL | Status: DC | PRN
  Administered 2022-02-18: 1 via ORAL
  Filled 2022-02-14: qty 1

## 2022-02-14 NOTE — Plan of Care (Signed)
Discussed with patient plan of care for the evening, pain management and eating with some teach back displayed.  Patient arousable but drowsy after haldol given earlier ? ?Problem: Education: ?Goal: Knowledge of General Education information will improve ?Description: Including pain rating scale, medication(s)/side effects and non-pharmacologic comfort measures ?Outcome: Not Progressing ?  ?Problem: Health Behavior/Discharge Planning: ?Goal: Ability to manage health-related needs will improve ?Outcome: Not Progressing ?  ?

## 2022-02-14 NOTE — Progress Notes (Signed)
Modified Barium Swallow Progress Note ? ?Patient Details  ?Name: HYRUM SHANEYFELT ?MRN: [id number] ?Date of Birth: 09-Oct-1986 ? ?Today's Date: 02/14/2022 ? ?Modified Barium Swallow completed.  Full report located under Chart Review in the Imaging Section. ? ?Brief recommendations include the following: ? ?Clinical Impression ? Patient presents with a mildly impaired oral phase and a severely impaired pharyngeal phase of swallow function. During oral phase, patient exhibit mild anterior to posterior delays with puree solids and honey thick liquids. During pharyngeal phase, he exhibited swallow initiation delays to level of vallecular sinus with puree and honey thick liquids and to level of pyriform sinus with nectar thick liquids. He exhibited a moderate amount of aspiration during the swallow and after the swallow from residuals with nectar thick liquids. He exhibited immediate cough when aspiration began but this was ineffective to clear aspirate and led to patient continuing to cough and start to wretch, prompting radiology tech to prepare oral suction. He did not exhibit any penetration or aspiration with honey thick liquids or puree solids and only trace to mild vallecular and pyriform sinus residuals observed post initial swallows. SLP did observe mild amount of puree barium remaining on top of UES and required a second swallow to clear. One instance of retrograde movement of puree barium at level of cervical esophagus. SLP is recommending to initiate PO diet of Dys 1 (puree) solids and honey thick liquids. SLP will follow patient for diet toleration and upgraded solids trials at bedside. Prognosis for upgrading solids is good based on current and past performance, however prognosis of upgrading liquids from honey thick is guarded. ?  ?Swallow Evaluation Recommendations ? ?   ? ? SLP Diet Recommendations: Honey thick liquids;Dysphagia 1 (Puree) solids ? ? Liquid Administration via: Cup ? ? Medication  Administration: Whole meds with puree ? ? Supervision: Patient able to self feed;Full supervision/cueing for compensatory strategies ? ? Compensations: Slow rate;Small sips/bites;Minimize environmental distractions ? ?   ? ? Oral Care Recommendations: Oral care BID ? ?   ? ? ? ?Sonia Baller, MA, CCC-SLP ?Speech Therapy ? ?

## 2022-02-14 NOTE — Progress Notes (Signed)
?      ? ? ? [facility] ?                                                                           ? ? ?[name], is a 36 y.o. male, DOB - 11-09-1985, JJO:[id number] ?Admit date - 02/13/2022    ?Outpatient Primary MD for the patient is [name], MD ? ?LOS - 1  days ? ? ?    ? ?Brief summary  ? ?Patient is a 36 year old male with HIV, bipolar disorder, depression, PTSD, schizoaffective disorder, seizures presented to ED via EMS after found by home health aide on the floor.  Patient was unable to provide any history due to altered mental status and somnolence.  Apparently patient had a unwitnessed fall out of bed and his home health aide called the EMS.  Patient was found to be hypotensive by EMS with BP 82/40, hypothermic with temp of 94.4 ?F.  In ED, briefly he was hypotensive with BP in 60s.  Patient was noted to have multiple medication and alcohol bottles spread around at home. 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 ?Patient has unstageable wounds on both of his buttocks and sacrum ? ? ?Assessment & Plan  ? ? ?Principal problem ?Severe sepsis ?-Patient presented with hypotension, hypothermia, leukocytosis, lactic acidosis, hypothermia, possibly also has hypovolemia with poor p.o. intake, hypoalbuminemia, source possibly due to buttocks wound ?-CT chest abdomen pelvis did not show any acute chest or abdominal pathology. ?-UA showed trace leukocytes, positive nitrates, follow urine culture.  Blood cultures negative so far ?-COVID-19, flu A/B- ?-Random cortisol level inconclusive as patient had already received high-dose hydrocortisone 100 mg IV x1.  Taper hydrocortisone ?-Still hypotensive, add midodrine 10 mg 3 times daily ?--Albumin less than 1.5, add IV albumin 25g IV q6hrs x3 ?-Continue IV vancomycin, cefepime, Flagyl ?-Wound care consulted, added zinc, vitamin  C. ?-Continue IV hydrocortisone, taper to 50 mg every 12 hours, continue IV fluids. ?-If no significant improvement in BP, may need vasopressors ? ? ?  ?Acute metabolic encephalopathy ?-Acute due to #1, somewhat more alert and oriented today. ?-CT head negative for any intracranial abnormality ?-Continue IV antibiotics ?-PT OT evaluation when able ?-Ammonia level 38, continue lactulose ?  ?HIV ?-Resume Biktarvy ?  ?Hypokalemia ?-Resolved ?  ?Severe protein calorie malnutrition ?-Albumin less than 1.5, appears to be cachectic ?- will give IV albumin x3 doses ?-Nutrition consult ?  ?History of bipolar disorder, schizoaffective disorder ?-Resume Klonopin due to previous history of catatonia  ?-Psychiatry consult as patient is on multiple psych medications ?  ?Known history of normocytic anemia  ?-H&H currently stable, close to baseline ?-Hemoglobin 10.0 on 12/09/2021, H&H stable ?  ?  ?History of chronic alcohol use,  history of cirrhosis ?-Placed on CIWA protocol, thiamine, folate ? ? ? ? Pressure Injury Documentation: ?Pressure Injury 02/13/22 Buttocks Bilateral (Active)  ?02/13/22 2030  ?Location: Buttocks  ?Location Orientation: Bilateral  ?Staging:   ?Wound Description (Comments):   ?Present on Admission: Yes  ?Dressing Type None 02/14/22 0800  ? ? ?Code Status: Full CODE STATUS ?DVT Prophylaxis:  enoxaparin (  LOVENOX) injection 40 mg Start: 02/13/22 2230 ? ? ?Level of Care: Level of care: Stepdown ?Family Communication: No family at the bedside ?Disposition Plan:      Remains inpatient appropriate: Severe sepsis, work-up in progress ? ? ?Procedures:  ?None ? ?Consultants ?Psychiatry ? ?Antimicrobials:  ?Vancomycin IV 02/14/2022- ?Cefepime IV    02/14/2022- ?Flagyl IV 02/14/2022- ? ? ?Medications ? chlorhexidine  15 mL Mouth Rinse BID  ? Chlorhexidine Gluconate Cloth  6 each Topical Daily  ? enoxaparin (LOVENOX) injection  40 mg Subcutaneous Z56L  ? folic acid  1 mg Oral Daily  ? hydrocortisone sod succinate (SOLU-CORTEF)  inj  50 mg Intravenous Q8H  ? mouth rinse  15 mL Mouth Rinse q12n4p  ? mouth rinse  15 mL Mouth Rinse q12n4p  ? midodrine  10 mg Oral TID WC  ? multivitamin with minerals  1 tablet Oral Daily  ? sodium chloride flush  10-40 mL Intracatheter Q12H  ? thiamine  100 mg Oral Daily  ? Or  ? thiamine  100 mg Intravenous Daily  ? ? ? ? ?Subjective:  ? ?[name] was seen and examined today.  Much more alert and awake today, speaks very softly however mental status not close to his baseline. ?No acute issues overnight.  BP still soft borderline. ? ?Objective:  ? ?Vitals:  ? 02/14/22 0600 02/14/22 0723 02/14/22 0800 02/14/22 0830  ?BP: (!) 93/52 (!) 93/48 (!) 95/53 (!) 95/46  ?Pulse: 93 87 82 82  ?Resp: 17 14 14 14   ?Temp:      ?TempSrc:      ?SpO2: 99% 100% 100% 100%  ?Weight:      ?Height:      ? ? ?Intake/Output Summary (Last 24 hours) at 02/14/2022 1004 ?Last data filed at 02/14/2022 8756 ?Gross per 24 hour  ?Intake 4741.84 ml  ?Output 400 ml  ?Net 4341.84 ml  ? ? ? ?Wt Readings from Last 3 Encounters:  ?02/13/22 86 kg  ?11/04/21 86.5 kg  ?08/28/21 109.5 kg  ? ? ? ?Exam ?General: Somnolent but easily arousable and able to respond to questions, NAD ?Cardiovascular: S1 S2 auscultated,  RRR ?Respiratory: Clear to auscultation bilaterally, no wheezing, ?Gastrointestinal: Soft, nontender, nondistended, + bowel sounds ?Ext: no pedal edema bilaterally ?Neuro: Strength 5/5 upper and lower extremities bilaterally, extensive scarring however no active drainage, hidradenitis ?Skin: Unstageable wounds on the buttocks and sacrum, ?Psych: somnolent ? ? ? ? ? ?Data Reviewed:  I have personally reviewed following labs  ? ? ?CBC ?Lab Results  ?Component Value Date  ? WBC 19.2 (H) 02/14/2022  ? RBC 2.91 (L) 02/14/2022  ? HGB 9.1 (L) 02/14/2022  ? HCT 28.6 (L) 02/14/2022  ? MCV 98.3 02/14/2022  ? MCH 31.3 02/14/2022  ? PLT 133 (L) 02/14/2022  ? MCHC 31.8 02/14/2022  ? RDW 17.2 (H) 02/14/2022  ? LYMPHSABS 1.8 02/13/2022  ? MONOABS 0.5  02/13/2022  ? EOSABS 0.0 02/13/2022  ? BASOSABS 0.0 02/13/2022  ? ? ? ?Last metabolic panel ?Lab Results  ?Component Value Date  ? NA 140 02/14/2022  ? K 4.1 02/14/2022  ? CL 107 02/14/2022  ? CO2 27 02/14/2022  ? BUN 6 02/14/2022  ? CREATININE 0.93 02/14/2022  ? GLUCOSE 94 02/14/2022  ? GFRNONAA >60 02/14/2022  ? GFRAA 87 11/25/2020  ? CALCIUM 7.7 (L) 02/14/2022  ? PHOS 3.8 10/20/2021  ? PROT 6.0 (L) 02/13/2022  ? ALBUMIN <1.5 (L) 02/13/2022  ? LABGLOB 4.3 (H) 10/01/2019  ? AGRATIO 1.0 (  L) 09/02/2019  ? BILITOT 0.7 02/13/2022  ? ALKPHOS 128 (H) 02/13/2022  ? AST 20 02/13/2022  ? ALT 13 02/13/2022  ? ANIONGAP 6 02/14/2022  ? ? ? ? ? ?Coagulation Profile: ?Recent Labs  ?Lab 02/13/22 ?1343  ?INR 1.4*  ? ? ? ?Radiology Studies: I have personally reviewed the imaging studies  ?CT HEAD WO CONTRAST (5MM) ? ?Result Date: 02/13/2022 ?CLINICAL DATA:  Mental status change. EXAM: CT HEAD WITHOUT CONTRAST TECHNIQUE: Contiguous axial images were obtained from the base of the skull through the vertex without intravenous contrast. RADIATION DOSE REDUCTION: This exam was performed according to the departmental dose-optimization program which includes automated exposure control, adjustment of the mA and/or kV according to patient size and/or use of iterative reconstruction technique. COMPARISON:  August 23, 2021 FINDINGS: Brain: No evidence of acute infarction, hemorrhage, hydrocephalus, extra-axial collection or mass lesion/mass effect. Vascular: No hyperdense vessel or unexpected calcification. Skull: Normal. Negative for fracture or focal lesion. Sinuses/Orbits: No acute finding. Other: None. IMPRESSION: No acute intracranial abnormalities. No cause for the patient's symptoms identified. Electronically Signed   By: Dorise Bullion III M.D.   On: 02/13/2022 19:21  ? ?CT CHEST ABDOMEN PELVIS W CONTRAST ? ?Result Date: 02/13/2022 ? IMPRESSION: 1. No acute findings within the chest, abdomen or pelvis. 2. Stable moderate avascular  necrosis of the left femoral head with subchondral fracture. 3. Stable small umbilical hernia containing only peritoneal fat. 4. Aortic atherosclerosis. 5. Stable adenopathy over the superficial inguinal and external ili

## 2022-02-14 NOTE — Progress Notes (Signed)
Peripherally Inserted Central Catheter Placement ? ?The IV Nurse has discussed with the patient and/or persons authorized to consent for the patient, the purpose of this procedure and the potential benefits and risks involved with this procedure.  The benefits include less needle sticks, lab draws from the catheter, and the patient may be discharged home with the catheter. Risks include, but not limited to, infection, bleeding, blood clot (thrombus formation), and puncture of an artery; nerve damage and irregular heartbeat and possibility to perform a PICC exchange if needed/ordered by physician.  Alternatives to this procedure were also discussed.  Bard Power PICC patient education guide, fact sheet on infection prevention and patient information card has been provided to patient /or left at bedside.   ? ?PICC Placement Documentation  ?PICC Double Lumen 02/14/22 Right Brachial 42 cm 1 cm (Active)  ?Indication for Insertion or Continuance of Line Vasoactive infusions 02/14/22 0955  ?Exposed Catheter (cm) 1 cm 02/14/22 0955  ?Site Assessment Clean, Dry, Intact 02/14/22 0955  ?Lumen #1 Status Flushed;Blood return noted;Saline locked 02/14/22 0955  ?Lumen #2 Status Flushed;Saline locked;Blood return noted 02/14/22 0955  ?Dressing Type Securing device;Transparent 02/14/22 0955  ?Dressing Status Antimicrobial disc in place 02/14/22 0955  ?Dressing Intervention New dressing;Other (Comment) 02/14/22 0955  ?Dressing Change Due 02/21/22 02/14/22 0955  ? ? ? ? ? ?[name] ?02/14/2022, 9:56 AM ? ?

## 2022-02-14 NOTE — Evaluation (Signed)
Clinical/Bedside Swallow Evaluation ?Patient Details  ?Name: [name] ?MRN: [id number] ?Date of Birth: 11-27-1985 ? ?Today's Date: 02/14/2022 ?Time: SLP Start Time (ACUTE ONLY): 1320 SLP Stop Time (ACUTE ONLY): 5498 ?SLP Time Calculation (min) (ACUTE ONLY): 15 min ? ?Past Medical History:  ?Past Medical History:  ?Diagnosis Date  ? Bipolar 1 disorder ([place])   ? Depression   ? Dizziness and giddiness 02/01/2016  ? Herpes genitalia   ? HIV disease ([place])   ? Hypertension   ? Hyponatremia   ? Hypothermia 08/24/2021  ? Migraine headache 02/01/2016  ? Peripheral neuropathy 10/01/2019  ? PTSD (post-traumatic stress disorder)   ? Schizoaffective disorder ([place])   ? Seizures ([place])   ? ?Past Surgical History:  ?Past Surgical History:  ?Procedure Laterality Date  ? BACK SURGERY    ? BIOPSY  02/26/2021  ? Procedure: BIOPSY;  Surgeon: Otis Brace, MD;  Location: [facility];  Service: Gastroenterology;;  ? COLONOSCOPY WITH PROPOFOL [name] 02/26/2021  ? Procedure: COLONOSCOPY WITH PROPOFOL;  Surgeon: Otis Brace, MD;  Location: [facility];  Service: Gastroenterology;  Laterality: [name];  ? ESOPHAGOGASTRODUODENOSCOPY (EGD) WITH PROPOFOL [name] 02/26/2021  ? Procedure: ESOPHAGOGASTRODUODENOSCOPY (EGD) WITH PROPOFOL;  Surgeon: Otis Brace, MD;  Location: [facility];  Service: Gastroenterology;  Laterality: [name];  ? ESOPHAGOGASTRODUODENOSCOPY (EGD) WITH PROPOFOL [name] 05/18/2021  ? Procedure: ESOPHAGOGASTRODUODENOSCOPY (EGD) WITH PROPOFOL;  Surgeon: Otis Brace, MD;  Location: [facility];  Service: Gastroenterology;  Laterality: [name];  ? HAND SURGERY    ? ?HPI:  ?Patient is a 36 y.o. male who is well known to SLP secondary to frequent admissions and chronic dysphagia. He has PMH: HIV, bipolar disorder, PTSD, schizoaffective disorder, seizures. He presented to [facility] ED via EMS after being found on the floor at home by home health aide. Apparently, patient had an unwittnessed fall out of bed. When EMS arrived, they noted  that patient had multiple medication and alcohol bottles spread around at home.  In ED, patient was briefly hypotensive with BP in 60's, CT chest/abdomen/pelvis showed no acute findings. Patient admitted with severe sepsis and was hypotensive, hypothermic, possible hypovolemia with poor PO intake; UA showed  trace leukocytes, positive nitrates. Patient with unstageable wounds on both of his buttocks and sacrum.  ?  ?Assessment / Plan / Recommendation  ?Clinical Impression ? Patient presents with clinical s/s of dysphagia as per this bedside/clinical swallow evaluation. SLP observed him with cup sips of nectar thick liquids and honey thick liquids as well as solids (saltine cracker). With nectar thick liquids he exhibited both delayed and immediate coughing, with honey thick liquids he exhibited only delayed coughing and with solids he did not exhibit any overt s/s aspiration or penetration. As patient has a h/o chronic dysphagia, SLP is recommending continue NPO status until objective swallow study (MBS) can be completed to determine if he has had any decline in his swallow function. ?SLP Visit Diagnosis: Dysphagia, unspecified (R13.10) ?   ?Aspiration Risk ? Severe aspiration risk;Risk for inadequate nutrition/hydration  ?  ?Diet Recommendation NPO  ? ?Medication Administration: Via alternative means  ?  ?Other  Recommendations Oral Care Recommendations: Oral care QID;Staff/trained caregiver to provide oral care   ? ?Recommendations for follow up therapy are one component of a multi-disciplinary discharge planning process, led by the attending physician.  Recommendations may be updated based on patient status, additional functional criteria and insurance authorization. ? ?Follow up Recommendations Other (comment) (TBD)  ? ? ?  ?Assistance Recommended at Discharge Frequent or constant Supervision/Assistance  ?  Functional Status Assessment Patient has had a recent decline in their functional status and demonstrates  the ability to make significant improvements in function in a reasonable and predictable amount of time.  ?Frequency and Duration min 2x/week  ?1 week ?  ?   ? ?Prognosis Prognosis for Safe Diet Advancement: Guarded ?Barriers to Reach Goals: Time post onset;Cognitive deficits;Severity of deficits  ? ?  ? ?Swallow Study   ?General Date of Onset: 02/14/22 ?HPI: Patient is a 36 y.o. male who is well known to SLP secondary to frequent admissions and chronic dysphagia. He has PMH: HIV, bipolar disorder, PTSD, schizoaffective disorder, seizures. He presented [facility] ED via EMS after being found on the floor at home by home health aide. Apparently, patient had an unwittnessed fall out of bed. When EMS arrived, they noted that patient had multiple medication and alcohol bottles spread around at home.  In ED, patient was briefly hypotensive with BP in 60's, CT chest/abdomen/pelvis showed no acute findings. Patient admitted with severe sepsis and was hypotensive, hypothermic, possible hypovolemia with poor PO intake; UA showed  trace leukocytes, positive nitrates. Patient with unstageable wounds on both of his buttocks and sacrum. ?Type of Study: Bedside Swallow Evaluation ?Previous Swallow Assessment: during previous multiple admissions, most recent MBS: 11/02/21 recommending regular solids, nectar thick liquids ?Diet Prior to this Study: NPO ?Temperature Spikes Noted: No ?Respiratory Status: Nasal cannula ?History of Recent Intubation: No ?Behavior/Cognition: Alert;Cooperative ?Oral Cavity Assessment: Excessive secretions;Dry ?Oral Care Completed by SLP: Recent completion by staff (RN orally suctioning patient as SLP entered room) ?Oral Cavity - Dentition: Adequate natural dentition ?Vision: Functional for self-feeding ?Self-Feeding Abilities: Able to feed self ?Patient Positioning: Upright in bed ?Baseline Vocal Quality: Other (comment);Hoarse (dysphonic, appears at baseline) ?Volitional Cough: Weak ?Volitional Swallow: Able  to elicit  ?  ?Oral/Motor/Sensory Function Overall Oral Motor/Sensory Function: Within functional limits   ?Ice Chips     ?Thin Liquid Thin Liquid: Not tested  ?  ?Nectar Thick Nectar Thick Liquid: Impaired ?Presentation: Cup ?Pharyngeal Phase Impairments: Cough - Immediate;Cough - Delayed;Suspected delayed Swallow   ?Honey Thick Honey Thick Liquid: Impaired ?Presentation: Cup ?Pharyngeal Phase Impairments: Suspected delayed Swallow;Cough - Delayed   ?Puree Puree: Not tested   ?Solid ? ? ?  Solid: Impaired ?Oral Phase Impairments: Impaired mastication ?Pharyngeal Phase Impairments: Suspected delayed Swallow  ? ?  ? ?Sonia Baller, MA, CCC-SLP ?Speech Therapy ? ? ? ? ?

## 2022-02-14 NOTE — Consult Note (Signed)
WOC Nurse Consult Note: ?Patient receiving care in [facility] ICU 1228. Primary RN present at time of my assessment ?Reason for Consult: buttocks wound ?Wound type: In scattered areas of the buttocks, posterior upper thighs there are pin-point holes with drainage coming from them. There is extensive scarring in the bilateral axilla and buttocks/thighs.  The axillary areas are currently without drainage. All of these areas are consistent with Hidradenitis suppurativa (HS).  The patient is in no condition to talk with me about these areas. He can barely look my way and try to communicate with yes/no answers.  ?If this is HS, it is then a chronic, non-curable condition. ?Pressure Injury POA: Yes/No/NA ?Measurement: ?Wound bed: ?Drainage (amount, consistency, odor)  ?Periwound: ?Dressing procedure/placement/frequency: ?Wash the buttocks, posterior upper thighs with soap and water, pat dry. Be sure to keep the DermaTherapy linen against the skin. ? ?Monitor the wound area(s) for worsening of condition such as: ?Signs/symptoms of infection,  ?Increase in size,  ?Development of or worsening of odor, ?Development of pain, or increased pain at the affected locations.  Notify the medical team if any of these develop. ? ?Thank you for the consult.  Discussed plan of care with the bedside nurse.  [place] nurse will not follow at this time.  Please re-consult the [place] team if needed. ? ?Val Riles, RN, MSN, CWOCN, CNS-BC, pager [phone]  ?  ?

## 2022-02-15 DIAGNOSIS — E43 Unspecified severe protein-calorie malnutrition: Secondary | ICD-10-CM | POA: Diagnosis not present

## 2022-02-15 DIAGNOSIS — G9341 Metabolic encephalopathy: Secondary | ICD-10-CM | POA: Diagnosis not present

## 2022-02-15 DIAGNOSIS — B20 Human immunodeficiency virus [HIV] disease: Secondary | ICD-10-CM | POA: Diagnosis not present

## 2022-02-15 LAB — BASIC METABOLIC PANEL
Anion gap: 3 — ABNORMAL LOW (ref 5–15)
BUN: 8 mg/dL (ref 6–20)
CO2: 28 mmol/L (ref 22–32)
Calcium: 8 mg/dL — ABNORMAL LOW (ref 8.9–10.3)
Chloride: 108 mmol/L (ref 98–111)
Creatinine, Ser: 0.83 mg/dL (ref 0.61–1.24)
GFR, Estimated: >60 mL/min (ref >60)
Glucose, Bld: 123 mg/dL — ABNORMAL HIGH (ref 70–99)
Potassium: 3.7 mmol/L (ref 3.5–5.1)
Sodium: 139 mmol/L (ref 135–145)

## 2022-02-15 LAB — CBC
HCT: 24.1 % — ABNORMAL LOW (ref 39.0–52.0)
Hemoglobin: 7.7 g/dL — ABNORMAL LOW (ref 13.0–17.0)
MCH: 32.2 pg (ref 26.0–34.0)
MCHC: 32 g/dL (ref 30.0–36.0)
MCV: 100.8 fL — ABNORMAL HIGH (ref 80.0–100.0)
Platelets: 111 10*3/uL — ABNORMAL LOW (ref 150–400)
RBC: 2.39 MIL/uL — ABNORMAL LOW (ref 4.22–5.81)
RDW: 17.4 % — ABNORMAL HIGH (ref 11.5–15.5)
WBC: 16.9 10*3/uL — ABNORMAL HIGH (ref 4.0–10.5)
nRBC: 0 % (ref 0.0–0.2)

## 2022-02-15 LAB — URINE CULTURE

## 2022-02-15 MED ORDER — HYDROCORTISONE SOD SUC (PF) 100 MG IJ SOLR
25.0000 mg | Freq: Two times a day (BID) | INTRAMUSCULAR | Status: DC
Start: 2022-02-15 — End: 2022-02-22
  Administered 2022-02-15 – 2022-02-22 (×10): 25 mg via INTRAVENOUS
  Filled 2022-02-15 (×2): qty 2
  Filled 2022-02-15: qty 0.5
  Filled 2022-02-15: qty 2
  Filled 2022-02-15 (×2): qty 0.5
  Filled 2022-02-15 (×6): qty 2
  Filled 2022-02-15: qty 0.5

## 2022-02-15 MED ORDER — DOXYCYCLINE HYCLATE 100 MG PO TABS
100.0000 mg | ORAL_TABLET | Freq: Two times a day (BID) | ORAL | Status: DC
  Administered 2022-02-15 – 2022-02-20 (×10): 100 mg via ORAL
  Filled 2022-02-15 (×10): qty 1

## 2022-02-15 MED ORDER — SODIUM CHLORIDE 0.9 % IV SOLN
2.0000 g | INTRAVENOUS | Status: DC
  Administered 2022-02-15 – 2022-02-19 (×5): 2 g via INTRAVENOUS
  Filled 2022-02-15 (×6): qty 20

## 2022-02-15 NOTE — TOC Initial Note (Signed)
Transition of Care (TOC) - Initial/Assessment Note  ? ? ?Patient Details  ?Name: [name] ?MRN: [id number] ?Date of Birth: 06-Dec-1985 ? ?Transition of Care (TOC) CM/SW Contact:    ?Genna Casimir, LCSW ?Phone Number: ?02/15/2022, 2:41 PM ? ?Clinical Narrative:                 ?Received TOC order to assess SA concerns.  Met with pt who is very pleasant, soft-spoken but able to provide information.  Reviewed current living situation and resources involved with pt.  Notes he has a PCS aide via his Medicaid for 4hrs/ day as well as [facility] providing [place] visits (confirmed with CW they are active with pt.)  Pt utilizes Medicaid transportation for MD visits.  States he feels comfortable with the supports he has in place and no concerns about returning home. ?Addressed concerns with ETOH use and pt agrees that this is a "problem" for him.  Reports he has received treatment for this in the past.  He acknowledges that the ETOH abuse is just an additional stress on this body but does not feel that he needs inpatient or IOP services.  He is open to [facility] providing resource information - will place on the AVS.  ?TOC will follow for any additional dc needs that may arise. ? ?Expected Discharge Plan: [place] ?Barriers to Discharge: Continued Medical Work up ? ? ?Patient Goals and CMS Choice ?Patient states their goals for this hospitalization and ongoing recovery are:: return home ?  ?  ? ?Expected Discharge Plan and Services ?Expected Discharge Plan: [place] ?In-house Referral: Clinical Social Work ?  ?Post Acute Care Choice: Home Health ?Living arrangements for the past 2 months: Apartment ?                ?  ?  ?  ?  ?  ?  ?  ?  ?  ?  ? ?Prior Living Arrangements/Services ?Living arrangements for the past 2 months: Apartment ?Lives with:: Self ?Patient language and need for interpreter reviewed:: Yes ?Do you feel safe going back to the place where you live?: Yes      ?  ?Care giver  support system in place?: Yes (comment) ?Current home services: DME, Homehealth aide ?Criminal Activity/Legal Involvement Pertinent to Current Situation/Hospitalization: No - Comment as needed ? ?Activities of Daily Living ?Home Assistive Devices/Equipment: None ?ADL Screening (condition at time of admission) ?Patient's cognitive ability adequate to safely complete daily activities?: Yes ?Is the patient deaf or have difficulty hearing?: No ?Does the patient have difficulty seeing, even when wearing glasses/contacts?: No ?Does the patient have difficulty concentrating, remembering, or making decisions?: Yes ?Patient able to express need for assistance with ADLs?: No ?Does the patient have difficulty dressing or bathing?: No ?Independently performs ADLs?: Yes (appropriate for developmental age) ?Does the patient have difficulty walking or climbing stairs?: No ?Weakness of Legs: None ?Weakness of Arms/Hands: None ? ?Permission Sought/Granted ?Permission sought to share information with : Family Supports ?Permission granted to share information with : Yes, Verbal Permission Granted ? Share Information with NAME: [place] ?   ? Permission granted to share info w Relationship: mother ? Permission granted to share info w Contact Information: [phone] ? ?Emotional Assessment ?Appearance:: Appears older than stated age ?Attitude/Demeanor/Rapport: Gracious ?Affect (typically observed): Accepting ?Orientation: : Oriented to Self, Oriented to Place, Oriented to  Time, Oriented to Situation ?Alcohol / Substance Use: Alcohol Use ?Psych Involvement: No (comment) ? ?  Admission diagnosis:  Sepsis ([place]) [A41.9] ?Patient Active Problem List  ? Diagnosis Date Noted  ? Sepsis ([place]) 02/13/2022  ? Malnutrition of moderate degree 10/20/2021  ? Palliative care by specialist   ? DNR (do not resuscitate) 10/14/2021  ? Aspiration pneumonia ([place]) 10/14/2021  ? FTT (failure to thrive) in adult 10/14/2021  ? Dysphagia 10/14/2021  ?  Coagulopathy ([place]) 10/14/2021  ? Protein-calorie malnutrition, severe ([place]) 10/09/2021  ?  Class: Chronic  ? Avascular necrosis of femoral head, left ([place]) 10/07/2021  ? Alcoholic cirrhosis of liver without ascites ([place]) 10/07/2021  ? Cellulitis of multiple sites of buttock 10/07/2021  ? Hypotension   ? Hypoxia 08/24/2021  ? Left hip pain   ? Transaminitis   ? Acute metabolic encephalopathy 58/59/2924  ? Cellulitis 05/07/2021  ? Lymphadenopathy 09/29/2020  ? Anemia   ? Upper urinary tract infection   ? Sepsis secondary to UTI ([place]) 03/14/2020  ? Cellulitis of groin 03/14/2020  ? HIV disease ([place])   ? Macrocytic anemia   ? Thrombocytopenia ([place])   ? Prolonged QT interval   ? Acute respiratory failure due to COVID-19 [facility]) 10/27/2019  ? GERD (gastroesophageal reflux disease) 10/27/2019  ? Hypokalemia 10/27/2019  ? Peripheral neuropathy 10/01/2019  ? PTSD (post-traumatic stress disorder) 07/23/2018  ? Dizziness and giddiness 02/01/2016  ? Migraine headache 02/01/2016  ? Schizoaffective disorder, depressive type ([place]) 05/13/2015  ? Severe alcohol dependence ([place]) 05/13/2015  ? Suicidal ideation 01/12/2014  ? ?PCP:  [name], MD ?Pharmacy:   ?[facility], [address] ?[place] [address] ?Phone: [phone] Fax: [phone] ? ? ? ? ?Social Determinants of Health (SDOH) Interventions ?  ? ?Readmission Risk Interventions ? ?  02/15/2022  ?  2:35 PM 08/24/2021  ?  4:00 PM 08/24/2021  ? 11:54 AM  ?Readmission Risk Prevention Plan  ?Transportation Screening Complete Complete   ?PCP or Specialist Appt within 3-5 Days  Complete Complete  ?[place] or Home Care Consult  Complete Complete  ?Social Work Consult for [place] Planning/Counseling   Complete  ?Palliative Care Screening  Complete Complete  ?Medication Review Press photographer) Complete Complete Complete  ?PCP or Specialist appointment within 3-5 days of discharge Complete    ?[place] or Home Care Consult  Complete    ?SW Recovery Care/Counseling Consult Complete    ?[place] Not Applicable    ? ? ? ?

## 2022-02-15 NOTE — Progress Notes (Signed)
Speech Language Pathology Treatment: Dysphagia  ?Patient Details ?Name: [name] ?MRN: [id number] ?DOB: April 18, 1986 ?Today's Date: 02/15/2022 ?Time: 8768-1157 ?SLP Time Calculation (min) (ACUTE ONLY): 40 min ? ?Assessment / Plan / Recommendation ?Clinical Impression ? Skilled SLP completed to address dysphagia goals including advance po trials, review of MBS from yesterday and January MBS and support for dysphagia compensation strategies.  Pt upright in bed, weak volitional cough and voice noted = which he endorses.  He reports swallow function is normal in his opinion but did not recall aspiration nor having retention in pharynx of pudding.  Using teach back with clinical reasoning, pt benefited from max cues to reiterate.  His affect appears flat but he is motivated to consume po.  PO trials included graham crackers x3, applesauce x4 ounces, honey thick liquids, nectar liquids, tsp thin juice and thin water, single ice chips.  Pt presents with delayed cough after snack and needs cues to swallow in timely fashion with SLP verbal directions of "organize and swallow".  Cough was not productive.  To help maximize pt's intake and challenge swallow, recommend dys3/HTL and tsps thin water/ice chips allowed.  Huel advised SLP that he would use caution with po - updated swallow precaution sign in his room. Pt will need strict supervision for swallow precaution compliance. He remains an aspiration risk but with precautions, risk if mitigated.  Would recommend to initiate RMT to help with pulmonary clearance when he does aspirate. ? ?  ?HPI HPI: Patient is a 36 y.o. male who is well known to SLP secondary to frequent admissions and chronic dysphagia. He has PMH: HIV, bipolar disorder, PTSD, schizoaffective disorder, seizures. He presented to [facility] ED via EMS after being found on the floor at home by home health aide. Apparently, patient had an unwittnessed fall out of bed. When EMS arrived, they noted that patient had  multiple medication and alcohol bottles spread around at home.  In ED, patient was briefly hypotensive with BP in 60's, CT chest/abdomen/pelvis showed no acute findings. Patient admitted with severe sepsis and was hypotensive, hypothermic, possible hypovolemia with poor PO intake; UA showed  trace leukocytes, positive nitrates. Patient with unstageable wounds on both of his buttocks and sacrum. ?  ?   ?SLP Plan ? Continue with current plan of care ? ?  ?  ?Recommendations for follow up therapy are one component of a multi-disciplinary discharge planning process, led by the attending physician.  Recommendations may be updated based on patient status, additional functional criteria and insurance authorization. ?  ? ?Recommendations  ?Diet recommendations: Dysphagia 3 (mechanical soft);Honey-thick liquid (ice, tsps thin water) ?Liquids provided via: Teaspoon;Cup ?Medication Administration: Whole meds with puree ?Supervision: Full supervision/cueing for compensatory strategies;Staff to assist with self feeding ?Compensations: Slow rate;Small sips/bites;Minimize environmental distractions (assure pt swallows before giving more po intake, stop po if pt coughing) ?Postural Changes and/or Swallow Maneuvers: Seated upright 90 degrees;Upright 30-60 min after meal  ?   ?    ?   ? ? ? ? Oral Care Recommendations: Oral care QID;Staff/trained caregiver to provide oral care ?Assistance recommended at discharge: Frequent or constant Supervision/Assistance ?SLP Visit Diagnosis: Dysphagia, unspecified (R13.10) ?Plan: Continue with current plan of care ? ? ? ? ?  ?  ? ?Kathleen Lime, MS CCC SLP ?Acute Rehab Services ?Office [phone] ?Pager [phone] ? ?[name] ? ?02/15/2022, 12:51 PM ?

## 2022-02-15 NOTE — Progress Notes (Addendum)
?      ? ? ? [facility] ?                                                                           ? ? ?[name], is a 36 y.o. male, DOB - [birth year], QIH:[id number] ?Admit date - 02/13/2022    ?Outpatient Primary MD for the patient is [name], MD ? ?LOS - 2  days ? ? ?    ? ?Brief summary  ? ?Patient is a 36 year old male with HIV, bipolar disorder, depression, PTSD, schizoaffective disorder, seizures presented to ED via EMS after found by home health aide on the floor.  Patient was unable to provide any history due to altered mental status and somnolence.  Apparently patient had a unwitnessed fall out of bed and his home health aide called the EMS.  Patient was found to be hypotensive by EMS with BP 82/40, hypothermic with temp of 94.4 ?F.  In ED, briefly he was hypotensive with BP in 60s.  Patient was noted to have multiple medication and alcohol bottles spread around at home. 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 ?Patient has unstageable wounds on both of his buttocks and sacrum ? ? ?Assessment & Plan  ? ? 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 intake, hypoalbuminemia, source possibly due to buttocks wound ?-CT chest abdomen pelvis did not show any acute chest or abdominal pathology. ?-UA showed trace leukocytes, positive nitrates, follow urine culture.  Blood cultures negative so far ?-COVID-19, flu A/B- ?-Random cortisol level inconclusive as patient had already received high-dose hydrocortisone 100 mg IV x1.  Taper hydrocortisone to 25q12hrs, off tomorrow ?-BP improving, continue midodrine 10 mg 3 times daily ?--Albumin less than 1.5, received IV albumin 25g IV q6hrs x3  doses ?-Narrow antibiotics to IV Rocephin and doxycycline ?-Wound care following ? ?  ?Acute metabolic encephalopathy ?-Acute due to #1, much more alert and oriented today ?-CT head negative for any intracranial abnormality ?-Continue doxycycline, IV Rocephin ?-Continue lactulose ?-Start PT, increase mobilization ?  ?HIV ?-Continue Biktarvy ?  ?Hypokalemia ?-Resolved ?  ?Severe protein calorie malnutrition ?-Albumin less than 1.5, appears to be cachectic ?-Received IV albumin x3 doses ?-Nutrition consult, encourage p.o. diet ?  ?History of bipolar disorder, schizoaffective disorder ?-Resume Klonopin due to previous history of catatonia  ?-Discussed with psychiatry on 5/8, resumed psych meds (recommended trazodone, Latuda, Haldol, Klonopin).   ? ? ?Known history of normocytic anemia  ?-Hemoglobin down to 7.7 today, no bleeding, likely hemodilution ?-DC IV fluids, encourage p.o. diet ?  ?  ?History of chronic alcohol use,  history of cirrhosis ?-Placed on CIWA protocol, thiamine, folate ? ? ? ? Pressure Injury Documentation: ?Pressure Injury 02/13/22 Buttocks Bilateral (Active)  ?02/13/22 2030  ?Location: Buttocks  ?Location Orientation: Bilateral  ?Staging:   ?Wound Description (Comments):   ?Present on Admission: Yes  ?Dressing Type None 02/15/22 0409  ? ? ?Code Status: Full CODE STATUS ?DVT Prophylaxis:  enoxaparin (LOVENOX) injection 40 mg  Start: 02/13/22 2230 ? ? ?Level of Care: Level of care: Stepdown ?Family Communication: No family at the bedside ?Disposition Plan:      Remains inpatient appropriate: Severe sepsis, work-up in progress. ? ? ?Procedures:  ?None ? ?Consultants ?Psychiatry ? ?Antimicrobials:  ?Vancomycin IV 02/14/2022-5/9 ?Cefepime IV    02/14/2022-5/9 ?Flagyl IV 02/14/2022-5/9 ? ? ?Medications ? bictegravir-emtricitabine-tenofovir AF  1 tablet Oral Daily  ? chlorhexidine  15 mL Mouth Rinse BID  ? Chlorhexidine Gluconate Cloth  6 each Topical Daily  ? clonazePAM  0.5 mg Oral BID  ? doxycycline  100 mg  Oral Q12H  ? enoxaparin (LOVENOX) injection  40 mg Subcutaneous Q24H  ? famotidine  40 mg Oral Daily  ? FLUoxetine  40 mg Oral Daily  ? folic acid  1 mg Oral Daily  ? haloperidol  5 mg Oral Daily  ? hydrocortisone sod succinate (SOLU-CORTEF) inj  25 mg Intravenous Q12H  ? lactulose  20 g Oral BID  ? linaclotide  145 mcg Oral QAC breakfast  ? lurasidone  120 mg Oral Daily  ? mouth rinse  15 mL Mouth Rinse q12n4p  ? mouth rinse  15 mL Mouth Rinse q12n4p  ? midodrine  10 mg Oral TID WC  ? multivitamin with minerals  1 tablet Oral Daily  ? pantoprazole  40 mg Oral Daily  ? sodium chloride flush  10-40 mL Intracatheter Q12H  ? thiamine  100 mg Oral Daily  ? Or  ? thiamine  100 mg Intravenous Daily  ? valACYclovir  1,000 mg Oral Daily  ? ? ? ? ?Subjective:  ? ?[name] was seen and examined today.  Alert and oriented today, afebrile, BP improving.  Hungry, wants to try regular diet (has aspiration risk per SLP).  No active nausea vomiting, abdominal pain. ? ?Objective:  ? ?Vitals:  ? 02/15/22 0900 02/15/22 1000 02/15/22 1100 02/15/22 1245  ?BP: 138/90 120/87 123/84   ?Pulse: (!) 49 63 (!) 56   ?Resp: 16 16 14    ?Temp:    (!) 96.3 ?F (35.7 ?C)  ?TempSrc:    Axillary  ?SpO2: 91% (!) 87% 90%   ?Weight:      ?Height:      ? ? ?Intake/Output Summary (Last 24 hours) at 02/15/2022 1410 ?Last data filed at 02/15/2022 1029 ?Gross per 24 hour  ?Intake 4535.49 ml  ?Output 900 ml  ?Net 3635.49 ml  ? ? ? ?Wt Readings from Last 3 Encounters:  ?02/13/22 86 kg  ?11/04/21 86.5 kg  ?08/28/21 109.5 kg  ? ?Physical Exam ?General: Alert and oriented x 3, NAD, soft speech  ?Cardiovascular: S1 S2 clear, RRR.  ?Respiratory: Diminished breath sounds at the bases otherwise clear, no wheezing ?Gastrointestinal: Soft, nontender, nondistended, NBS ?Ext: no pedal edema bilaterally ?Neuro: no new deficits ?Skin: Wounds on the buttocks and sacrum ?Psych: Normal affect and demeanor, alert and oriented x3  ? ? ? ? ? ? ?Data Reviewed:  I have personally  reviewed following labs  ? ? ?CBC ?Lab Results  ?Component Value Date  ? WBC 16.9 (H) 02/15/2022  ? RBC 2.39 (L) 02/15/2022  ? HGB 7.7 (L) 02/15/2022  ? HCT 24.1 (L) 02/15/2022  ? MCV 100.8 (H) 02/15/2022  ? MCH 32.2 02/15/2022  ? PLT 111 (L) 02/15/2022  ? MCHC 32.0 02/15/2022  ? RDW 17.4 (H) 02/15/2022  ? LYMPHSABS 1.8 02/13/2022  ? MONOABS 0.5 02/13/2022  ? EOSABS 0.0 02/13/2022  ? BASOSABS 0.0 02/13/2022  ? ? ? ?Last  metabolic panel ?Lab Results  ?Component Value Date  ? NA 139 02/15/2022  ? K 3.7 02/15/2022  ? CL 108 02/15/2022  ? CO2 28 02/15/2022  ? BUN 8 02/15/2022  ? CREATININE 0.83 02/15/2022  ? GLUCOSE 123 (H) 02/15/2022  ? GFRNONAA >60 02/15/2022  ? GFRAA 87 11/25/2020  ? CALCIUM 8.0 (L) 02/15/2022  ? PHOS 3.8 10/20/2021  ? PROT 6.0 (L) 02/13/2022  ? ALBUMIN <1.5 (L) 02/13/2022  ? LABGLOB 4.3 (H) 10/01/2019  ? AGRATIO 1.0 (L) 09/02/2019  ? BILITOT 0.7 02/13/2022  ? ALKPHOS 128 (H) 02/13/2022  ? AST 20 02/13/2022  ? ALT 13 02/13/2022  ? ANIONGAP 3 (L) 02/15/2022  ? ? ? ? ? ?Coagulation Profile: ?Recent Labs  ?Lab 02/13/22 ?1343  ?INR 1.4*  ? ? ? ?Radiology Studies: I have personally reviewed the imaging studies  ?CT HEAD WO CONTRAST (5MM) ? ?Result Date: 02/13/2022 ?CLINICAL DATA:  Mental status change. EXAM: CT HEAD WITHOUT CONTRAST TECHNIQUE: Contiguous axial images were obtained from the base of the skull through the vertex without intravenous contrast. RADIATION DOSE REDUCTION: This exam was performed according to the departmental dose-optimization program which includes automated exposure control, adjustment of the mA and/or kV according to patient size and/or use of iterative reconstruction technique. COMPARISON:  August 23, 2021 FINDINGS: Brain: No evidence of acute infarction, hemorrhage, hydrocephalus, extra-axial collection or mass lesion/mass effect. Vascular: No hyperdense vessel or unexpected calcification. Skull: Normal. Negative for fracture or focal lesion. Sinuses/Orbits: No acute finding.  Other: None. IMPRESSION: No acute intracranial abnormalities. No cause for the patient's symptoms identified. Electronically Signed   By: Dorise Bullion III M.D.   On: 02/13/2022 19:21  ? ?CT CHEST AB

## 2022-02-16 DIAGNOSIS — E43 Unspecified severe protein-calorie malnutrition: Secondary | ICD-10-CM | POA: Diagnosis not present

## 2022-02-16 DIAGNOSIS — A419 Sepsis, unspecified organism: Secondary | ICD-10-CM

## 2022-02-16 DIAGNOSIS — B20 Human immunodeficiency virus [HIV] disease: Secondary | ICD-10-CM | POA: Diagnosis not present

## 2022-02-16 DIAGNOSIS — R6520 Severe sepsis without septic shock: Secondary | ICD-10-CM

## 2022-02-16 DIAGNOSIS — G9341 Metabolic encephalopathy: Secondary | ICD-10-CM | POA: Diagnosis not present

## 2022-02-16 LAB — CBC
HCT: 25.4 % — ABNORMAL LOW (ref 39.0–52.0)
Hemoglobin: 8.2 g/dL — ABNORMAL LOW (ref 13.0–17.0)
MCH: 32.8 pg (ref 26.0–34.0)
MCHC: 32.3 g/dL (ref 30.0–36.0)
MCV: 101.6 fL — ABNORMAL HIGH (ref 80.0–100.0)
Platelets: 120 K/uL — ABNORMAL LOW (ref 150–400)
RBC: 2.5 MIL/uL — ABNORMAL LOW (ref 4.22–5.81)
RDW: 17.4 % — ABNORMAL HIGH (ref 11.5–15.5)
WBC: 15.1 K/uL — ABNORMAL HIGH (ref 4.0–10.5)
nRBC: 0 % (ref 0.0–0.2)

## 2022-02-16 LAB — BASIC METABOLIC PANEL
Anion gap: 6 (ref 5–15)
BUN: 10 mg/dL (ref 6–20)
CO2: 26 mmol/L (ref 22–32)
Calcium: 8.2 mg/dL — ABNORMAL LOW (ref 8.9–10.3)
Chloride: 109 mmol/L (ref 98–111)
Creatinine, Ser: 0.93 mg/dL (ref 0.61–1.24)
GFR, Estimated: >60 mL/min (ref >60)
Glucose, Bld: 132 mg/dL — ABNORMAL HIGH (ref 70–99)
Potassium: 3.1 mmol/L — ABNORMAL LOW (ref 3.5–5.1)
Sodium: 141 mmol/L (ref 135–145)

## 2022-02-16 MED ORDER — TRAMADOL HCL 50 MG PO TABS
50.0000 mg | ORAL_TABLET | Freq: Three times a day (TID) | ORAL | Status: DC | PRN
  Administered 2022-02-16: 50 mg via ORAL
  Filled 2022-02-16: qty 1

## 2022-02-16 MED ORDER — ACETAMINOPHEN 325 MG PO TABS
650.0000 mg | ORAL_TABLET | Freq: Four times a day (QID) | ORAL | Status: DC | PRN
  Administered 2022-02-21 – 2022-03-04 (×5): 650 mg via ORAL
  Filled 2022-02-16 (×5): qty 2

## 2022-02-16 MED ORDER — POTASSIUM CHLORIDE 10 MEQ/100ML IV SOLN
10.0000 meq | INTRAVENOUS | Status: AC
  Administered 2022-02-16 – 2022-02-17 (×6): 10 meq via INTRAVENOUS
  Filled 2022-02-16 (×6): qty 100

## 2022-02-16 MED ORDER — MUPIROCIN 2 % EX OINT
1.0000 "application " | TOPICAL_OINTMENT | Freq: Two times a day (BID) | CUTANEOUS | Status: AC
  Administered 2022-02-16 – 2022-02-19 (×5): 1 via NASAL
  Filled 2022-02-16: qty 22

## 2022-02-16 MED ORDER — NICOTINE 21 MG/24HR TD PT24
21.0000 mg | MEDICATED_PATCH | Freq: Every day | TRANSDERMAL | Status: DC
  Administered 2022-02-16 – 2022-03-04 (×16): 21 mg via TRANSDERMAL
  Filled 2022-02-16 (×16): qty 1

## 2022-02-16 NOTE — Progress Notes (Addendum)
Speech Language Pathology Treatment: Dysphagia  ?Patient Details ?Name: [name] ?MRN: [id number] ?DOB: 07-Sep-1986 ?Today's Date: 02/16/2022 ?Time: 8088-1103 ?SLP Time Calculation (min) (ACUTE ONLY): 9 min ? ?Assessment / Plan / Recommendation ?Clinical Impression ? Dysphagia session was brief as pt just completed breakfast when SLP arrived. He was agreeable to consume additional bites and sips and was cued to straighten himself due to leaning to his left. Pt was able to recall having the MBS Monday and stated reasoning for thickened liquids independently. Self fed eggs at an appropriate rate and bite size and sips honey thick soda. Oral cavity was inspected and he did not have residue or difficulty masticating. No cough, throat clear and vocal quality was clear and strong. Unsure of his rate while he was eating as per previous notes he is impulsive. Recommend he have supervision with Dys 3 texture, honey thick liquids to facilitate safe po consumption. ST will continue.  ?  ?HPI HPI: Patient is a 36 y.o. male who is well known to SLP secondary to frequent admissions and chronic dysphagia. He has PMH: HIV, bipolar disorder, PTSD, schizoaffective disorder, seizures. He presented to [facility] ED via EMS after being found on the floor at home by home health aide. Apparently, patient had an unwittnessed fall out of bed. When EMS arrived, they noted that patient had multiple medication and alcohol bottles spread around at home.  In ED, patient was briefly hypotensive with BP in 60's, CT chest/abdomen/pelvis showed no acute findings. Patient admitted with severe sepsis and was hypotensive, hypothermic, possible hypovolemia with poor PO intake; UA showed  trace leukocytes, positive nitrates. Patient with unstageable wounds on both of his buttocks and sacrum. ?  ?   ?SLP Plan ? Continue with current plan of care ? ?  ?  ?Recommendations for follow up therapy are one component of a multi-disciplinary discharge planning  process, led by the attending physician.  Recommendations may be updated based on patient status, additional functional criteria and insurance authorization. ?  ? ?Recommendations  ?Diet recommendations: Dysphagia 3 (mechanical soft);Honey-thick liquid ?Liquids provided via: Teaspoon;Cup ?Medication Administration: Whole meds with puree ?Supervision: Staff to assist with self feeding;Full supervision/cueing for compensatory strategies ?Compensations: Slow rate;Small sips/bites;Minimize environmental distractions ?Postural Changes and/or Swallow Maneuvers: Seated upright 90 degrees;Upright 30-60 min after meal  ?   ?    ?   ? ? ? ? Oral Care Recommendations: Oral care BID ?Follow Up Recommendations:  (TBD) ?Assistance recommended at discharge: Frequent or constant Supervision/Assistance ?SLP Visit Diagnosis: Dysphagia, unspecified (R13.10) ?Plan: Continue with current plan of care ? ? ? ? ?  ?  ? ? ?[name] ? ?02/16/2022, 9:48 AM ?

## 2022-02-16 NOTE — Progress Notes (Signed)
?PROGRESS NOTE ? ? ? ?[name]  FUX:[id number] DOB: [birth year] DOA: 02/13/2022 ?PCP: [name], MD  ? ? ? ?Brief Narrative:  ?36 year old BM PMHx HIV, bipolar disorder, depression, PTSD, schizoaffective disorder, seizures ? ?Presented to ED via EMS after found by home health aide on the floor.  Patient was unable to provide any history due to altered mental status and somnolence.  Apparently patient had a unwitnessed fall out of bed and his home health aide called the EMS.  Patient was found to be hypotensive by EMS with BP 82/40, hypothermic with temp of 94.4 ?F.  In ED, briefly he was hypotensive with BP in 60s.  Patient was noted to have multiple medication and alcohol bottles spread around at home. 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 ?Patient has unstageable wounds on both of his buttocks and sacrum ? ? ?Subjective: ?A/O x4.  Complains of left hip pain.  States has been evaluated for LEFT hip arthroplasty by [name] orthopedic surgery, but no scheduled date.  Sees Dr. [name] ID in [place].  Consumes 40 ounces beer daily, negative other alcohol or drugs. ? ? ?Assessment & Plan: ?Covid vaccination; ?  ?Principal Problem: ?  Sepsis ([place]) ?Active Problems: ?  Protein-calorie malnutrition, severe ([place]) ?  Schizoaffective disorder, depressive type ([place]) ?  HIV disease ([place]) ?  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 intake, hypoalbuminemia, source possibly due to buttocks wound ?-CT chest abdomen pelvis did not show any acute chest or abdominal pathology. ?-UA showed trace leukocytes, positive nitrates, follow urine culture.  Blood cultures negative so far ?-Random  cortisol level inconclusive as patient had already received high-dose hydrocortisone 100 mg IV x1.  Taper hydrocortisone to 25q12hrs, off tomorrow ?-BP improving, continue midodrine 10 mg 3 times daily ?--Albumin less than 1.5, received IV albumin 25g IV q6hrs x3 doses ?-Narrow antibiotics to IV Rocephin and doxycycline ?-Wound care following ?  ?Acute metabolic encephalopathy ?-Acute due to #1, much more alert and oriented today ?-CT head negative for any intracranial abnormality ?-Continue doxycycline, IV Rocephin ?-Continue lactulose ?-Start PT, increase mobilization ?-5/10 resolved ?  ?HIV ?-Continue Biktarvy ? ?QT prolongation? ?- 5/10 EKG pending ?  ?Hypokalemia ?-Potassium goal> 4 ?-5/10 potassium IV 60 mEq ?  ?Severe protein calorie malnutrition ?-Albumin less than 1.5, appears to be cachectic ?-Received IV albumin x3 doses ?-Nutrition consult, encourage p.o. diet ?  ?History of bipolar disorder, schizoaffective disorder ?-Resume Klonopin due to previous history of catatonia  ?-Discussed with psychiatry on 5/8, resumed psych meds (recommended trazodone, Latuda, Haldol, Klonopin).   ?  ?  ?Known history of normocytic anemia  ?-Hemoglobin down to 7.7 today, no bleeding, likely hemodilution ?-DC IV fluids, encourage p.o. diet ?Lab Results  ?Component Value Date  ? HGB 8.2 (L) 02/16/2022  ? HGB 7.7 (L) 02/15/2022  ? HGB 9.1 (L) 02/14/2022  ? HGB 11.5 (L) 02/13/2022  ? HGB 10.0 (L) 12/09/2021  ? ?  ?Chronic EtOH use/ETOH Liver cirrhosis ?-Placed on CIWA protocol, thiamine, folate ?-5/20 discussed with patient need to absolutely discontinue drinking. ? ?Pressure Injury 02/13/22 Buttocks Bilateral (Active)  ?02/13/22 2030  ?Location: Buttocks  ?Location Orientation: Bilateral  ?Staging:   ?Wound Description (Comments):   ?Present on Admission: Yes  ?Dressing Type None 02/15/22 0409  ? ? ? ?  ? ? ?  Mobility Assessment (last 72 hours)   ? ? Mobility Assessment   ?No documentation. ? ?  ?  ? ?  ? ?Goals of care ?- 5/10  palliative care consult: Multisystem organ failure, alcohol abuse, liver cirrhosis discuss change of CODE STATUS to DNR.  Appointment of HCPOA ? ?   ?DVT prophylaxis: Lovenox ?Code Status: Full ?Family Communication:  ?Status is: Inpatient ? ? ? ?Dispo: The patient is from: Home ?             Anticipated d/c is to: SNF ?             Anticipated d/c date is: > 3 days ?             Patient currently is not medically stable to d/c. ? ? ? ? ? ?Consultants:  ? ? ?Procedures/Significant Events:  ? ? ?I have personally reviewed and interpreted all radiology studies and my findings are as above. ? ?VENTILATOR SETTINGS: ? ? ? ?Cultures ? ? ?Antimicrobials: ?Anti-infectives (From admission, onward)  ? ? Start     Dose/Rate Route Frequency Ordered Stop  ? 02/15/22 2200  doxycycline (VIBRA-TABS) tablet 100 mg       ? 100 mg Oral Every 12 hours 02/15/22 1231    ? 02/15/22 1800  cefTRIAXone (ROCEPHIN) 2 g in sodium chloride 0.9 % 100 mL IVPB       ? 2 g ?200 mL/hr over 30 Minutes Intravenous Every 24 hours 02/15/22 1231    ? 02/14/22 1200  bictegravir-emtricitabine-tenofovir AF (BIKTARVY) 50-200-25 MG per tablet 1 tablet       ? 1 tablet Oral Daily 02/14/22 1010    ? 02/14/22 1200  valACYclovir (VALTREX) tablet 1,000 mg       ? 1,000 mg Oral Daily 02/14/22 1019    ? 02/14/22 0400  vancomycin (VANCOREADY) IVPB 1500 mg/300 mL  Status:  Discontinued       ? 1,500 mg ?150 mL/hr over 120 Minutes Intravenous Every 12 hours 02/13/22 1830 02/15/22 1231  ? 02/14/22 0200  ceFEPIme (MAXIPIME) 2 g in sodium chloride 0.9 % 100 mL IVPB  Status:  Discontinued       ? 2 g ?200 mL/hr over 30 Minutes Intravenous Every 8 hours 02/13/22 1830 02/15/22 1231  ? 02/13/22 2230  metroNIDAZOLE (FLAGYL) IVPB 500 mg  Status:  Discontinued       ? 500 mg ?100 mL/hr over 60 Minutes Intravenous Every 12 hours 02/13/22 2142 02/15/22 1231  ? 02/13/22 1530  ceFEPIme (MAXIPIME) 2 g in sodium chloride 0.9 % 100 mL IVPB       ? 2 g ?200 mL/hr over 30 Minutes  Intravenous  Once 02/13/22 1516 02/13/22 1908  ? 02/13/22 1530  vancomycin (VANCOREADY) IVPB 2000 mg/400 mL       ? 2,000 mg ?200 mL/hr over 120 Minutes Intravenous  Once 02/13/22 1525 02/13/22 1917  ? ?  ?  ? ? ?Devices ?  ? ?LINES / TUBES:  ? ? ? ? ?Continuous Infusions: ? sodium chloride    ? cefTRIAXone (ROCEPHIN)  IV Stopped (02/15/22 1816)  ? ? ? ?Objective: ?Vitals:  ? 02/16/22 0400 02/16/22 0500 02/16/22 0600 02/16/22 0745  ?BP: 113/86 121/79 116/88   ?Pulse: 75 65 65   ?Resp: 17 16 16    ?Temp:    97.7 ?F (36.5 ?C)  ?TempSrc:    Oral  ?SpO2: 97% 99% 97%   ?Weight:      ?Height:      ? ? ?  Intake/Output Summary (Last 24 hours) at 02/16/2022 1045 ?Last data filed at 02/16/2022 1000 ?Gross per 24 hour  ?Intake 653.34 ml  ?Output 1350 ml  ?Net -696.66 ml  ? ?Filed Weights  ? 02/13/22 1521  ?Weight: 86 kg  ? ? ?Examination: ? ?General: A/O x4 No acute respiratory distress, cachectic ?Eyes: negative scleral hemorrhage, negative anisocoria, negative icterus ?ENT: Negative Runny nose, negative gingival bleeding, ?Neck:  Negative scars, masses, torticollis, lymphadenopathy, JVD ?Lungs: Clear to auscultation bilaterally without wheezes or crackles ?Cardiovascular: Regular rate and rhythm without murmur gallop or rub normal S1 and S2 ?Abdomen: negative abdominal pain, nondistended, positive soft, bowel sounds, no rebound, no ascites, no appreciable mass ?Extremities: No significant cyanosis, clubbing, or edema bilateral lower extremities ?Skin: Negative rashes, lesions, ulcers ?Psychiatric:  Negative depression, negative anxiety, negative fatigue, negative mania  ?Central nervous system:  Cranial nerves II through XII intact, tongue/uvula midline, all extremities muscle strength 5/5, sensation intact throughout, negative dysarthria, negative expressive aphasia, negative receptive aphasia. ? ?.  ? ? ? ?Data Reviewed: Care during the described time interval was provided by me .  I have reviewed this patient's available  data, including medical history, events of note, physical examination, and all test results as part of my evaluation. ? ?CBC: ?Recent Labs  ?Lab 02/13/22 ?1343 02/14/22 ?0303 02/15/22 ?0420 02/16/22 ?1388  ?WBC 13.

## 2022-02-16 NOTE — Plan of Care (Signed)
?  Problem: Education: ?Goal: Knowledge of General Education information will improve ?Description: Including pain rating scale, medication(s)/side effects and non-pharmacologic comfort measures ?Outcome: Progressing ?  ?Problem: Health Behavior/Discharge Planning: ?Goal: Ability to manage health-related needs will improve ?Outcome: Progressing ?  ?Problem: Clinical Measurements: ?Goal: Ability to maintain clinical measurements within normal limits will improve ?Outcome: Progressing ?Goal: Will remain free from infection ?Outcome: Progressing ?Goal: Cardiovascular complication will be avoided ?Outcome: Progressing ?  ?Problem: Nutrition: ?Goal: Adequate nutrition will be maintained ?Outcome: Progressing ?  ?Problem: Coping: ?Goal: Level of anxiety will decrease ?Outcome: Progressing ?  ?Problem: Elimination: ?Goal: Will not experience complications related to bowel motility ?Outcome: Progressing ?Goal: Will not experience complications related to urinary retention ?Outcome: Progressing ?  ?Problem: Pain Managment: ?Goal: General experience of comfort will improve ?Outcome: Progressing ?  ?Problem: Safety: ?Goal: Ability to remain free from injury will improve ?Outcome: Progressing ?  ?Problem: Skin Integrity: ?Goal: Risk for impaired skin integrity will decrease ?Outcome: Progressing ?  ?

## 2022-02-16 NOTE — Evaluation (Addendum)
Physical Therapy Evaluation ?Patient Details ?Name: [name] ?MRN: [id number] ?DOB: Aug 11, 1986 ?Today's Date: 02/16/2022 ? ?History of Present Illness ? 36 year old male with HIV, bipolar disorder, depression, PTSD, schizoaffective disorder, seizures presented to ED via EMS after found by home health aide on the floor. Dx of severe sepsis with shock, buttock wounds.  ?Clinical Impression ? Pt admitted with above diagnosis. Pt ambulated 30' with RW, no loss of balance, distance limited by therpist 2* HR 155 with walking, pt was asymptomatic.  Pt currently with functional limitations due to the deficits listed below (see PT Problem List). Pt will benefit from skilled PT to increase their independence and safety with mobility to allow discharge to the venue listed below.   ?   ?   ? ?Recommendations for follow up therapy are one component of a multi-disciplinary discharge planning process, led by the attending physician.  Recommendations may be updated based on patient status, additional functional criteria and insurance authorization. ? ?Follow Up Recommendations SNF ? ?  ?Assistance Recommended at Discharge Intermittent Supervision/Assistance  ?Patient can return home with the following ? Help with stairs or ramp for entrance;A lot of help with bathing/dressing/bathroom;Assistance with cooking/housework;A little help with bathing/dressing/bathroom ? ?  ?Equipment Recommendations None recommended by PT  ?Recommendations for Other Services ?    ?  ?Functional Status Assessment Patient has had a recent decline in their functional status and demonstrates the ability to make significant improvements in function in a reasonable and predictable amount of time.  ? ?  ?Precautions / Restrictions Precautions ?Precautions: Fall;Other (comment) ?Precaution Comments: monitor HR, pt denies falls in past 6 months (was found on floor by HHA just prior to this admission) ?Restrictions ?Weight Bearing Restrictions: No  ? ?   ? ?Mobility ? Bed Mobility ?Overal bed mobility: Modified Independent ?  ?  ?  ?  ?  ?  ?General bed mobility comments: used rail, HOB up ?  ? ?Transfers ?Overall transfer level: Needs assistance ?Equipment used: Rolling walker (2 wheels) ?Transfers: Sit to/from Stand ?Sit to Stand: Min guard, From elevated surface ?  ?  ?  ?  ?  ?General transfer comment: min/guard safety, no physical assist needed ?  ? ?Ambulation/Gait ?Ambulation/Gait assistance: Supervision ?Gait Distance (Feet): 30 Feet ?Assistive device: Rolling walker (2 wheels) ?Gait Pattern/deviations: Step-through pattern, Decreased stride length ?Gait velocity: decr ?  ?  ?General Gait Details: steady with RW, distance limited by PT due to HR up to 155 while walking, pt asymptomatic ? ?Stairs ?  ?  ?  ?  ?  ? ?Wheelchair Mobility ?  ? ?Modified Rankin (Stroke Patients Only) ?  ? ?  ? ?Balance Overall balance assessment: Modified Independent ?  ?  ?  ?  ?  ?  ?  ?  ?  ?  ?  ?  ?  ?  ?  ?  ?  ?  ?   ? ? ? ?Pertinent Vitals/Pain Pain Assessment ?Pain Score: 9  ?Pain Location: L hip ?Pain Descriptors / Indicators: Sore ?Pain Intervention(s): Limited activity within patient's tolerance, Monitored during session, Patient requesting pain meds-RN notified, Repositioned  ? ? ?Home Living Family/patient expects to be discharged to:: Private residence ?  ?Available Help at Discharge: Family;Available PRN/intermittently;Personal care attendant ?Type of Home: Apartment ?Home Access: Stairs to enter ?Entrance Stairs-Rails: Right ?Entrance Stairs-Number of Steps: 3 ?  ?Home Layout: One level ?Home Equipment: Conservation officer, nature (2 wheels);Cane - single point;Wheelchair - manual;Hand held shower head;Tub bench;BSC/3in1;Hospital  bed ?Additional Comments: Pt reports having a lift recliner.  ?  ?Prior Function Prior Level of Function : Needs assist ?  ?  ?  ?  ?  ?  ?Mobility Comments: uses RW for ambulation, denies falls in past 6 months however he was found on floor just  prior to this admission, prior PT note 10/22/21 pt reported 2 recent falls ?ADLs Comments: pt has aide 7 days/week for 5 hours; aide assists with cleaning, cooking, laundry, bathing and dressing ?  ? ? ?Hand Dominance  ? Dominant Hand: Right ? ?  ?Extremity/Trunk Assessment  ? Upper Extremity Assessment ?Upper Extremity Assessment: Overall WFL for tasks assessed ?  ? ?Lower Extremity Assessment ?Lower Extremity Assessment: Overall WFL for tasks assessed;RLE deficits/detail;LLE deficits/detail (B knee ext +4/5) ?LLE Deficits / Details: edema noted lower leg/ankle ?LLE Sensation: WNL ?  ? ?Cervical / Trunk Assessment ?Cervical / Trunk Assessment: Normal  ?Communication  ? Communication: Expressive difficulties (very low volume)  ?Cognition Arousal/Alertness: Awake/alert ?Behavior During Therapy: [facility] for tasks assessed/performed ?Overall Cognitive Status: No family/caregiver present to determine baseline cognitive functioning ?  ?  ?  ?  ?  ?  ?  ?  ?  ?  ?  ?  ?  ?  ?  ?  ?General Comments: pt able to follow commands, denied falls in past 6 months however in PT note in January he reported 2 recent falls ?  ?  ? ?  ?General Comments   ? ?  ?Exercises    ? ?Assessment/Plan  ?  ?PT Assessment Patient needs continued PT services  ?PT Problem List Pain;Decreased activity tolerance ? ?   ?  ?PT Treatment Interventions Gait training;Therapeutic exercise;Functional mobility training;Patient/family education   ? ?PT Goals (Current goals can be found in the Care Plan section)  ?Acute Rehab PT Goals ?Patient Stated Goal: return home ?PT Goal Formulation: With patient ?Time For Goal Achievement: 03/02/22 ?Potential to Achieve Goals: Good ? ?  ?Frequency Min 3X/week ?  ? ? ?Co-evaluation   ?  ?  ?  ?  ? ? ?  ?AM-PAC PT "6 Clicks" Mobility  ?Outcome Measure Help needed turning from your back to your side while in a flat bed without using bedrails?: None ?Help needed moving from lying on your back to sitting on the side of a flat bed  without using bedrails?: A Little ?Help needed moving to and from a bed to a chair (including a wheelchair)?: A Little ?Help needed standing up from a chair using your arms (e.g., wheelchair or bedside chair)?: A Little ?Help needed to walk in hospital room?: None ?Help needed climbing 3-5 steps with a railing? : A Little ?6 Click Score: 20 ? ?  ?End of Session   ?Activity Tolerance: Treatment limited secondary to medical complications (Comment) (tachycardia) ?Patient left: in chair;with call bell/phone within reach;with nursing/sitter in room;with chair alarm set ?Nurse Communication: Mobility status ?PT Visit Diagnosis: Difficulty in walking, not elsewhere classified (R26.2);Pain ?Pain - Right/Left: Left ?Pain - part of body: Hip ?  ? ?Time: 1540-0867 ?PT Time Calculation (min) (ACUTE ONLY): 30 min ? ? ?Charges:   PT Evaluation ?$PT Eval Moderate Complexity: 1 Mod ?PT Treatments ?$Gait Training: 8-22 mins ?  ?   ? ? ?Blondell Reveal Kistler PT 02/16/2022  ?Acute Rehabilitation Services ?Pager [phone] ?Office [phone] ? ? ?

## 2022-02-17 DIAGNOSIS — Z7189 Other specified counseling: Secondary | ICD-10-CM | POA: Diagnosis not present

## 2022-02-17 DIAGNOSIS — R9431 Abnormal electrocardiogram [ECG] [EKG]: Secondary | ICD-10-CM

## 2022-02-17 DIAGNOSIS — K7030 Alcoholic cirrhosis of liver without ascites: Secondary | ICD-10-CM

## 2022-02-17 DIAGNOSIS — F319 Bipolar disorder, unspecified: Secondary | ICD-10-CM | POA: Diagnosis present

## 2022-02-17 DIAGNOSIS — F310 Bipolar disorder, current episode hypomanic: Secondary | ICD-10-CM

## 2022-02-17 DIAGNOSIS — F1090 Alcohol use, unspecified, uncomplicated: Secondary | ICD-10-CM

## 2022-02-17 DIAGNOSIS — R531 Weakness: Secondary | ICD-10-CM

## 2022-02-17 DIAGNOSIS — Z515 Encounter for palliative care: Secondary | ICD-10-CM

## 2022-02-17 DIAGNOSIS — G9341 Metabolic encephalopathy: Secondary | ICD-10-CM | POA: Diagnosis not present

## 2022-02-17 DIAGNOSIS — E43 Unspecified severe protein-calorie malnutrition: Secondary | ICD-10-CM | POA: Diagnosis not present

## 2022-02-17 DIAGNOSIS — A419 Sepsis, unspecified organism: Secondary | ICD-10-CM | POA: Diagnosis not present

## 2022-02-17 LAB — CBC WITH DIFFERENTIAL/PLATELET
Abs Immature Granulocytes: 0.14 K/uL — ABNORMAL HIGH (ref 0.00–0.07)
Basophils Absolute: 0 K/uL (ref 0.0–0.1)
Basophils Relative: 0 %
Eosinophils Absolute: 0 K/uL (ref 0.0–0.5)
Eosinophils Relative: 0 %
HCT: 25.5 % — ABNORMAL LOW (ref 39.0–52.0)
Hemoglobin: 8 g/dL — ABNORMAL LOW (ref 13.0–17.0)
Immature Granulocytes: 1 %
Lymphocytes Relative: 24 %
Lymphs Abs: 2.9 K/uL (ref 0.7–4.0)
MCH: 31.4 pg (ref 26.0–34.0)
MCHC: 31.4 g/dL (ref 30.0–36.0)
MCV: 100 fL (ref 80.0–100.0)
Monocytes Absolute: 0.5 K/uL (ref 0.1–1.0)
Monocytes Relative: 5 %
Neutro Abs: 8.4 K/uL — ABNORMAL HIGH (ref 1.7–7.7)
Neutrophils Relative %: 70 %
Platelets: 104 K/uL — ABNORMAL LOW (ref 150–400)
RBC: 2.55 MIL/uL — ABNORMAL LOW (ref 4.22–5.81)
RDW: 17.5 % — ABNORMAL HIGH (ref 11.5–15.5)
WBC: 12 K/uL — ABNORMAL HIGH (ref 4.0–10.5)
nRBC: 0 % (ref 0.0–0.2)

## 2022-02-17 LAB — MAGNESIUM: Magnesium: 1.8 mg/dL (ref 1.7–2.4)

## 2022-02-17 LAB — COMPREHENSIVE METABOLIC PANEL
ALT: 12 U/L (ref 0–44)
AST: 15 U/L (ref 15–41)
Albumin: 2.1 g/dL — ABNORMAL LOW (ref 3.5–5.0)
Alkaline Phosphatase: 89 U/L (ref 38–126)
Anion gap: 5 (ref 5–15)
BUN: 10 mg/dL (ref 6–20)
CO2: 26 mmol/L (ref 22–32)
Calcium: 8.2 mg/dL — ABNORMAL LOW (ref 8.9–10.3)
Chloride: 109 mmol/L (ref 98–111)
Creatinine, Ser: 0.83 mg/dL (ref 0.61–1.24)
GFR, Estimated: >60 mL/min (ref >60)
Glucose, Bld: 109 mg/dL — ABNORMAL HIGH (ref 70–99)
Potassium: 3.6 mmol/L (ref 3.5–5.1)
Sodium: 140 mmol/L (ref 135–145)
Total Bilirubin: 0.6 mg/dL (ref 0.3–1.2)
Total Protein: 6.4 g/dL — ABNORMAL LOW (ref 6.5–8.1)

## 2022-02-17 LAB — PHOSPHORUS: Phosphorus: 1.5 mg/dL — ABNORMAL LOW (ref 2.5–4.6)

## 2022-02-17 MED ORDER — ALBUMIN HUMAN 25 % IV SOLN
50.0000 g | Freq: Once | INTRAVENOUS | Status: AC
  Administered 2022-02-17: 50 g via INTRAVENOUS
  Filled 2022-02-17: qty 200

## 2022-02-17 MED ORDER — DEXTROSE 5 % IV SOLN: 30.0000 mmol | Freq: Once | INTRAVENOUS | Status: DC

## 2022-02-17 MED ORDER — DICLOFENAC SODIUM 1 % EX GEL
4.0000 g | Freq: Four times a day (QID) | CUTANEOUS | Status: DC
  Administered 2022-02-17 – 2022-03-02 (×28): 4 g via TOPICAL
  Filled 2022-02-17: qty 100

## 2022-02-17 MED ORDER — POTASSIUM PHOSPHATES 15 MMOLE/5ML IV SOLN
30.0000 mmol | Freq: Once | INTRAVENOUS | Status: AC
  Administered 2022-02-17: 30 mmol via INTRAVENOUS
  Filled 2022-02-17: qty 10

## 2022-02-17 NOTE — Plan of Care (Signed)
?  Problem: Education: ?Goal: Knowledge of General Education information will improve ?Description: Including pain rating scale, medication(s)/side effects and non-pharmacologic comfort measures ?Outcome: Progressing ?  ?Problem: Health Behavior/Discharge Planning: ?Goal: Ability to manage health-related needs will improve ?Outcome: Progressing ?  ?Problem: Clinical Measurements: ?Goal: Ability to maintain clinical measurements within normal limits will improve ?Outcome: Progressing ?Goal: Will remain free from infection ?Outcome: Progressing ?Goal: Cardiovascular complication will be avoided ?Outcome: Progressing ?  ?Problem: Nutrition: ?Goal: Adequate nutrition will be maintained ?Outcome: Progressing ?  ?Problem: Coping: ?Goal: Level of anxiety will decrease ?Outcome: Progressing ?  ?Problem: Elimination: ?Goal: Will not experience complications related to bowel motility ?Outcome: Progressing ?Goal: Will not experience complications related to urinary retention ?Outcome: Progressing ?  ?Problem: Pain Managment: ?Goal: General experience of comfort will improve ?Outcome: Progressing ?  ?Problem: Safety: ?Goal: Ability to remain free from injury will improve ?Outcome: Progressing ?  ?Problem: Skin Integrity: ?Goal: Risk for impaired skin integrity will decrease ?Outcome: Progressing ?  ?

## 2022-02-17 NOTE — Consult Note (Signed)
? ?                                                                                ?Consultation Note ?Date: 02/17/2022  ? ?Patient Name: [name]  ?DOB: 1986/08/26  MRN: [id number]  Age / Sex: 36 y.o., male  ?PCP: Drew Ebbs, MD ?Referring Physician: Allie Bossier, MD ? ?Reason for Consultation: Establishing goals of care ? ?HPI/Patient Profile: 36 y.o. male admitted on 02/13/2022    ? ?Clinical Assessment and Goals of Care: ? 36 year old BM PMHx HIV, bipolar disorder, depression, PTSD, schizoaffective disorder, seizures.  ?Known to PMT service, seen in previous hospitalization. Chart reviewed, patient seen and examined.  ?Patient admitted to hospital medicine service. PMT consult for code status and broad goals of care has been requested.  ?Palliative medicine is specialized medical care for people living with serious illness. It focuses on providing relief from the symptoms and stress of a serious illness. The goal is to improve quality of life for both the patient and the family. ?Goals of care: Broad aims of medical therapy in relation to the patient's values and preferences. Our aim is to provide medical care aimed at enabling patients to achieve the goals that matter most to them, given the circumstances of their particular medical situation and their constraints.  ? ?Patient is awake alert resting in bed. He states that he was found down by his nurse at home. He follows with ID in the outpatient setting. I discussed with him about his underlying serious illnesses and his current condition. Shared with him my concern that he is at risk for severe decline and decompensation. Talked with him about his severe hypo albuminemia and how that portends a poor prognosis. Patient is known to this Probation officer from a previous hospitalization which was a very prolonged one. His mother is his next of kin. Differences between full code versus DNR DNI discussed in detail as well.  ?  ? ?NEXT OF KIN ? Mother  ? ?SUMMARY  OF RECOMMENDATIONS   ?Full Code, Full scope care, discussed extensively with patient today, at the time on initial consultation.  ?Recommend SNF rehab with palliative on discharge.  ?Continue current mode of care.  ?  ?Code Status/Advance Care Planning: ?Full code ? ? ?Symptom Management:  ?  ? ?Palliative Prophylaxis:  ?Delirium Protocol ? ?Additional Recommendations (Limitations, Scope, Preferences): ?Full Scope Treatment ? ?Psycho-social/Spiritual:  ?Desire for further Chaplaincy support:yes ?Additional Recommendations: Caregiving  Support/Resources ? ?Prognosis:  ?Unable to determine ? ?Discharge Planning: [facility] for rehab with Palliative care service follow-up  ? ?  ? ?Primary Diagnoses: ?Present on Admission: ? Sepsis ([place]) ? Protein-calorie malnutrition, severe ([place]) ? Acute metabolic encephalopathy ? HIV disease ([place]) ? Schizoaffective disorder, depressive type ([place]) ? ? ?I have reviewed the medical record, interviewed the patient and family, and examined the patient. The following aspects are pertinent. ? ?Past Medical History:  ?Diagnosis Date  ? Bipolar 1 disorder ([place])   ? Depression   ? Dizziness and giddiness 02/01/2016  ? Herpes genitalia   ? HIV disease ([place])   ? Hypertension   ? Hyponatremia   ? Hypothermia 08/24/2021  ? Migraine  headache 02/01/2016  ? Peripheral neuropathy 10/01/2019  ? PTSD (post-traumatic stress disorder)   ? Schizoaffective disorder ([place])   ? Seizures ([place])   ? ?Social History  ? ?Socioeconomic History  ? Marital status: Single  ?  Spouse name: Not on file  ? Number of children: Not on file  ? Years of education: Not on file  ? Highest education level: Not on file  ?Occupational History  ? Occupation: [employer]  ?Tobacco Use  ? Smoking status: Every Day  ?  Packs/day: 1.00  ?  Types: Cigarettes  ? Smokeless tobacco: Never  ?Vaping Use  ? Vaping Use: Never used  ?Substance and Sexual Activity  ? Alcohol use: Yes  ?  Alcohol/week: 14.0 standard drinks  ?  Types: 14  Cans of beer per week  ?  Comment: 5-6 40oz per day  ? Drug use: No  ? Sexual activity: Not Currently  ?  Birth control/protection: Condom  ?Other Topics Concern  ? Not on file  ?Social History Narrative  ? Lives at home alone  ? Right-handed  ? Drinks 2-3 sodas per day  ? ?Social Determinants of Health  ? ?Financial Resource Strain: Not on file  ?Food Insecurity: Not on file  ?Transportation Needs: Not on file  ?Physical Activity: Not on file  ?Stress: Not on file  ?Social Connections: Not on file  ? ?Family History  ?Problem Relation Age of Onset  ? Alcohol abuse Mother   ? Schizophrenia Father   ? Depression Father   ? Alcohol abuse Father   ? Alcohol abuse Paternal Uncle   ? Alcohol abuse Paternal Uncle   ? ?Scheduled Meds: ? bictegravir-emtricitabine-tenofovir AF  1 tablet Oral Daily  ? chlorhexidine  15 mL Mouth Rinse BID  ? Chlorhexidine Gluconate Cloth  6 each Topical Daily  ? clonazePAM  0.5 mg Oral BID  ? diclofenac Sodium  4 g Topical QID  ? doxycycline  100 mg Oral Q12H  ? enoxaparin (LOVENOX) injection  40 mg Subcutaneous Q24H  ? famotidine  40 mg Oral Daily  ? FLUoxetine  40 mg Oral Daily  ? folic acid  1 mg Oral Daily  ? haloperidol  5 mg Oral Daily  ? hydrocortisone sod succinate (SOLU-CORTEF) inj  25 mg Intravenous Q12H  ? lactulose  20 g Oral BID  ? linaclotide  145 mcg Oral QAC breakfast  ? lurasidone  120 mg Oral Daily  ? mouth rinse  15 mL Mouth Rinse q12n4p  ? mouth rinse  15 mL Mouth Rinse q12n4p  ? midodrine  10 mg Oral TID WC  ? multivitamin with minerals  1 tablet Oral Daily  ? mupirocin ointment  1 application. Nasal BID  ? nicotine  21 mg Transdermal Daily  ? pantoprazole  40 mg Oral Daily  ? sodium chloride flush  10-40 mL Intracatheter Q12H  ? thiamine  100 mg Oral Daily  ? Or  ? thiamine  100 mg Intravenous Daily  ? valACYclovir  1,000 mg Oral Daily  ? ?Continuous Infusions: ? sodium chloride    ? cefTRIAXone (ROCEPHIN)  IV 2 g (02/16/22 1817)  ? potassium PHOSPHATE IVPB (in mmol) 30  mmol (02/17/22 1052)  ? ?PRN Meds:.acetaminophen, albuterol, diphenoxylate-atropine, sodium chloride flush, trazodone ?Medications Prior to Admission:  ?Prior to Admission medications   ?Medication Sig Start Date End Date Taking? Authorizing Provider  ?albuterol (VENTOLIN HFA) 108 (90 Base) MCG/ACT inhaler Inhale 1 puff into the lungs every 6 (six) hours as needed  for wheezing or shortness of breath.   Yes [provider]  ?alprazolam Duanne Moron) 2 MG tablet Take 2 mg by mouth in the morning, at noon, and at bedtime.   Yes [provider]  ?amoxicillin-clavulanate (AUGMENTIN) 875-125 MG tablet Take 1 tablet by mouth every 12 (twelve) hours.   Yes [provider]  ?benzonatate (TESSALON) 100 MG capsule Take 100 mg by mouth 3 (three) times daily as needed for cough.   Yes [provider]  ?bictegravir-emtricitabine-tenofovir AF (BIKTARVY) 50-200-25 MG TABS tablet Take 1 tablet by mouth daily.  02/24/17  Yes [provider]  ?cetirizine (ZYRTEC) 10 MG tablet Take 10 mg by mouth daily.   Yes [provider]  ?diphenoxylate-atropine (LOMOTIL) 2.5-0.025 MG tablet Take 1 tablet by mouth daily as needed for diarrhea or loose stools. 01/27/22  Yes [provider]  ?ergocalciferol (VITAMIN D2) 1.25 MG (50000 UT) capsule Take 50,000 Units by mouth once a week.   Yes [provider]  ?escitalopram (LEXAPRO) 20 MG tablet Take 1 tablet by mouth daily. 02/16/21  Yes [provider]  ?famotidine (PEPCID) 40 MG tablet Take 40 mg by mouth daily. 08/19/21  Yes [provider]  ?FLUoxetine (PROZAC) 40 MG capsule Take 40 mg by mouth daily.   Yes [provider]  ?folic acid (FOLVITE) 1 MG tablet Take 1 mg by mouth daily. 10/03/19  Yes [provider]  ?furosemide (LASIX) 40 MG tablet Take 40 mg by mouth daily. 02/27/20  Yes [provider]  ?haloperidol (HALDOL) 5 MG tablet Take 5 mg by mouth daily.   Yes [provider]   ?HYDROcodone-acetaminophen (NORCO/VICODIN) 5-325 MG tablet Take 1 tablet by mouth 2 (two) times daily as needed for moderate pain. 08/09/21  Yes [provider]  ?hydrOXYzine (ATARAX) 99 M

## 2022-02-17 NOTE — Care Management Important Message (Signed)
Important Message ? ?Patient Details IM Letter given to the Patient. ?Name: [name] ?MRN: [id number] ?Date of Birth: 01-31-1986 ? ? ?Medicare Important Message Given:  Yes ? ? ? ? ?Kerin Salen ?02/17/2022, 9:24 AM ?

## 2022-02-17 NOTE — Progress Notes (Signed)
?PROGRESS NOTE ? ? ? ?[name]  PIR:[id number] DOB: 02-10-1986 DOA: 02/13/2022 ?PCP: [name], MD  ? ? ? ?Brief Narrative:  ?36 year old BM PMHx HIV, bipolar disorder, depression, PTSD, schizoaffective disorder, seizures ? ?Presented to ED via EMS after found by home health aide on the floor.  Patient was unable to provide any history due to altered mental status and somnolence.  Apparently patient had a unwitnessed fall out of bed and his home health aide called the EMS.  Patient was found to be hypotensive by EMS with BP 82/40, hypothermic with temp of 94.4 ?F.  In ED, briefly he was hypotensive with BP in 60s.  Patient was noted to have multiple medication and alcohol bottles spread around at home. 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 ?Patient has unstageable wounds on both of his buttocks and sacrum ? ? ?Subjective: ?5/11 febrile overnight A/O x4 sitting in chair.  Patient agrees that it is unsafe for him to return home in his debilitated state.  He lives alone.  States has been evaluated for LEFT hip arthroplasty by [name] orthopedic surgery, but no scheduled date.  Sees Dr. [name] ID in [place].  Consumes 40 ounces beer daily, negative other alcohol or drugs. ? ? ?Assessment & Plan: ?Covid vaccination; ?  ?Principal Problem: ?  Sepsis ([place]) ?Active Problems: ?  Protein-calorie malnutrition, severe ([place]) ?  Schizoaffective disorder, depressive type ([place]) ?  HIV disease ([place]) ?  Prolonged QT interval ?  Acute metabolic encephalopathy ?  Alcoholic cirrhosis of liver without ascites ([place]) ?  Hyperphosphatemia ?  Alcoholic cirrhosis of liver ([place]) ?  Chronic alcohol use ?  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 intake, hypoalbuminemia, source possibly due to buttocks wound ?-CT chest abdomen pelvis did not show any acute chest or abdominal pathology. ?-UA showed trace leukocytes, positive nitrates, follow urine culture.  Blood cultures negative so far ?-Random cortisol level inconclusive as patient had already received high-dose hydrocortisone 100 mg IV x1.  Taper hydrocortisone to 25q12hrs, off tomorrow ?-BP improving, continue midodrine 10 mg 3 times daily ?--Albumin less than 1.5, received IV albumin 25g IV q6hrs x3 doses ?-Narrow antibiotics to IV Rocephin and doxycycline ?-Wound care following ?-5/11 Albumin 50 g x 1: Will help with patient's BP. ?  ?Acute metabolic encephalopathy ?-Acute due to #1, much more alert and oriented today ?-CT head negative for any intracranial abnormality ?-Continue doxycycline, IV Rocephin ?-Continue lactulose ?-Start PT, increase mobilization ?-5/10 resolved ?  ?HIV ?-Continue Biktarvy ? ?QT prolongation ?- 5/10 EKG; sinus arrhythmia, QTc prolonged 511 ?-5/11 monitor closely, limit QTc prolonging medication ?  ?  ?Severe protein calorie malnutrition ?-Albumin less than 1.5, appears to be cachectic ?-Received IV albumin x3 doses ?-Nutrition consult, encourage p.o. diet ?  ?History of bipolar disorder, schizoaffective disorder ?-Resume Klonopin due to previous history of catatonia  ?-Discussed with psychiatry on 5/8, resumed psych meds (recommended trazodone, Latuda, Haldol, Klonopin).   ?  ?  ?Normocytic Anemia  ?-Hemoglobin down to 7.7 today, no bleeding, likely hemodilution ?-DC IV fluids, encourage p.o. diet ?Lab Results  ?Component Value Date  ? HGB 8.0 (L) 02/17/2022  ? HGB 8.2 (L) 02/16/2022  ? HGB 7.7 (L) 02/15/2022  ? HGB 9.1 (L) 02/14/2022  ? HGB  11.5 (L) 02/13/2022  ? ?  ?Chronic EtOH use/ETOH Liver cirrhosis ?-Placed on CIWA protocol, thiamine, folate ?-5/20 discussed with patient need to absolutely discontinue  drinking. ? ?Hypokalemia ?-Potassium goal> 4 ? ?Hypophosphatemia ?- Phosphorus goal> 0.5 ?-5/11 K-Phos 30 mmol ? ?Pressure Injury 02/13/22 Buttocks Bilateral (Active)  ?02/13/22 2030  ?Location: Buttocks  ?Location Orientation: Bilateral  ?Staging:   ?Wound Description (Comments):   ?Present on Admission: Yes  ?Dressing Type None 02/16/22 2157  ? ? ? ?  ? ? ?Mobility Assessment (last 72 hours)   ? ? Mobility Assessment   ? ? [place] Name 02/16/22 2157 02/16/22 1234  ?  ?  ?  ? Does patient have an order for bedrest or is patient medically unstable Yes- Bedfast (Level 1) - Complete --     ? What is the highest level of mobility based on the progressive mobility assessment? Level 1 (Bedfast) - Unable to balance while sitting on edge of bed Level 5 (Walks with assist in room/hall) - Balance while stepping forward/back and can walk in room with assist - Complete     ? Is the above level different from baseline mobility prior to current illness? Yes - Recommend PT order --     ? ?  ?  ? ?  ? ?Goals of care ?- 5/10 palliative care consult: Multisystem organ failure, alcohol abuse, liver cirrhosis discuss change of CODE STATUS to DNR.  Appointment of HCPOA ? ?   ?DVT prophylaxis: Lovenox ?Code Status: Full ?Family Communication:  ?Status is: Inpatient ? ? ? ?Dispo: The patient is from: Home ?             Anticipated d/c is to:  home with HHPT and resumption of aide services  ?             Anticipated d/c date is: > 3 days ?             Patient currently is not medically stable to d/c. ? ? ? ? ? ?Consultants:  ?Palliative care ?Wound care ? ? ?Procedures/Significant Events:  ? ? ?I have personally reviewed and interpreted all radiology studies and my findings are as above. ? ?VENTILATOR SETTINGS: ?Room air 5/11 ?SPO2 98% ? ? ?Cultures ? ? ?Antimicrobials: ?Anti-infectives (From admission, onward)  ? ? Start     Ordered Stop  ? 02/15/22 2200  doxycycline (VIBRA-TABS) tablet 100 mg       ? 02/15/22 1231    ? 02/15/22 1800   cefTRIAXone (ROCEPHIN) 2 g in sodium chloride 0.9 % 100 mL IVPB       ? 02/15/22 1231    ? 02/14/22 1200  bictegravir-emtricitabine-tenofovir AF (BIKTARVY) 50-200-25 MG per tablet 1 tablet       ? 02/14/22 1010    ? 02/14/22 1200  valACYclovir (VALTREX) tablet 1,000 mg       ? 02/14/22 1019    ? 02/14/22 0400  vancomycin (VANCOREADY) IVPB 1500 mg/300 mL  Status:  Discontinued       ? 02/13/22 1830 02/15/22 1231  ? 02/14/22 0200  ceFEPIme (MAXIPIME) 2 g in sodium chloride 0.9 % 100 mL IVPB  Status:  Discontinued       ? 02/13/22 1830 02/15/22 1231  ? 02/13/22 2230  metroNIDAZOLE (FLAGYL) IVPB 500 mg  Status:  Discontinued       ? 02/13/22 2142 02/15/22 1231  ? 02/13/22 1530  ceFEPIme (MAXIPIME) 2 g in sodium chloride 0.9 % 100 mL IVPB       ?  02/13/22 1516 02/13/22 1908  ? 02/13/22 1530  vancomycin (VANCOREADY) IVPB 2000 mg/400 mL       ? 02/13/22 1525 02/13/22 1917  ? ?  ?  ? ? ?Devices ?  ? ?LINES / TUBES:  ? ? ? ? ?Continuous Infusions: ? sodium chloride    ? cefTRIAXone (ROCEPHIN)  IV 2 g (02/17/22 1830)  ? ? ? ?Objective: ?Vitals:  ? 02/17/22 0419 02/17/22 1431 02/17/22 1654 02/17/22 1709  ?BP: 116/83 116/87 110/88 109/88  ?Pulse: 83 88 (!) 102 (!) 101  ?Resp: 20 20 15    ?Temp: 97.9 ?F (36.6 ?C) 97.7 ?F (36.5 ?C) 98.2 ?F (36.8 ?C) 98 ?F (36.7 ?C)  ?TempSrc:  Oral Oral Oral  ?SpO2: 98% 100% 100% 100%  ?Weight:      ?Height:      ? ? ?Intake/Output Summary (Last 24 hours) at 02/17/2022 1856 ?Last data filed at 02/17/2022 1851 ?Gross per 24 hour  ?Intake 1668 ml  ?Output 750 ml  ?Net 918 ml  ? ?Filed Weights  ? 02/13/22 1521  ?Weight: 86 kg  ? ? ?Examination: ? ?General: A/O x4 No acute respiratory distress, cachectic ?Eyes: negative scleral hemorrhage, negative anisocoria, negative icterus ?ENT: Negative Runny nose, negative gingival bleeding, ?Neck:  Negative scars, masses, torticollis, lymphadenopathy, JVD ?Lungs: Clear to auscultation bilaterally without wheezes or crackles ?Cardiovascular: Regular rate and  rhythm without murmur gallop or rub normal S1 and S2 ?Abdomen: negative abdominal pain, nondistended, positive soft, bowel sounds, no rebound, no ascites, no appreciable mass ?Extremities: No significant cyanosis, clubbing, or edema bil

## 2022-02-18 DIAGNOSIS — K7030 Alcoholic cirrhosis of liver without ascites: Secondary | ICD-10-CM | POA: Diagnosis not present

## 2022-02-18 DIAGNOSIS — G9341 Metabolic encephalopathy: Secondary | ICD-10-CM | POA: Diagnosis not present

## 2022-02-18 DIAGNOSIS — A419 Sepsis, unspecified organism: Secondary | ICD-10-CM | POA: Diagnosis not present

## 2022-02-18 DIAGNOSIS — F310 Bipolar disorder, current episode hypomanic: Secondary | ICD-10-CM | POA: Diagnosis not present

## 2022-02-18 LAB — CULTURE, BLOOD (ROUTINE X 2): Culture: NO GROWTH

## 2022-02-18 LAB — CBC WITH DIFFERENTIAL/PLATELET
Abs Immature Granulocytes: 0.13 10*3/uL — ABNORMAL HIGH (ref 0.00–0.07)
Basophils Absolute: 0 10*3/uL (ref 0.0–0.1)
Basophils Relative: 0 %
Eosinophils Absolute: 0 10*3/uL (ref 0.0–0.5)
Eosinophils Relative: 0 %
HCT: 22.8 % — ABNORMAL LOW (ref 39.0–52.0)
Hemoglobin: 7.3 g/dL — ABNORMAL LOW (ref 13.0–17.0)
Immature Granulocytes: 1 %
Lymphocytes Relative: 25 %
Lymphs Abs: 2.7 10*3/uL (ref 0.7–4.0)
MCH: 31.9 pg (ref 26.0–34.0)
MCHC: 32 g/dL (ref 30.0–36.0)
MCV: 99.6 fL (ref 80.0–100.0)
Monocytes Absolute: 0.6 10*3/uL (ref 0.1–1.0)
Monocytes Relative: 6 %
Neutro Abs: 7.3 10*3/uL (ref 1.7–7.7)
Neutrophils Relative %: 68 %
Platelets: 98 10*3/uL — ABNORMAL LOW (ref 150–400)
RBC: 2.29 MIL/uL — ABNORMAL LOW (ref 4.22–5.81)
RDW: 17.4 % — ABNORMAL HIGH (ref 11.5–15.5)
WBC: 10.7 10*3/uL — ABNORMAL HIGH (ref 4.0–10.5)
nRBC: 0 % (ref 0.0–0.2)

## 2022-02-18 LAB — COMPREHENSIVE METABOLIC PANEL
ALT: 11 U/L (ref 0–44)
AST: 11 U/L — ABNORMAL LOW (ref 15–41)
Albumin: 2.5 g/dL — ABNORMAL LOW (ref 3.5–5.0)
Alkaline Phosphatase: 76 U/L (ref 38–126)
Anion gap: 4 — ABNORMAL LOW (ref 5–15)
BUN: 11 mg/dL (ref 6–20)
CO2: 26 mmol/L (ref 22–32)
Calcium: 7.8 mg/dL — ABNORMAL LOW (ref 8.9–10.3)
Chloride: 107 mmol/L (ref 98–111)
Creatinine, Ser: 0.79 mg/dL (ref 0.61–1.24)
GFR, Estimated: >60 mL/min (ref >60)
Glucose, Bld: 107 mg/dL — ABNORMAL HIGH (ref 70–99)
Potassium: 3.4 mmol/L — ABNORMAL LOW (ref 3.5–5.1)
Sodium: 137 mmol/L (ref 135–145)
Total Bilirubin: 0.6 mg/dL (ref 0.3–1.2)
Total Protein: 6.5 g/dL (ref 6.5–8.1)

## 2022-02-18 LAB — PHOSPHORUS: Phosphorus: 2 mg/dL — ABNORMAL LOW (ref 2.5–4.6)

## 2022-02-18 LAB — OCCULT BLOOD X 1 CARD TO LAB, STOOL: Fecal Occult Bld: NEGATIVE

## 2022-02-18 LAB — MAGNESIUM: Magnesium: 1.7 mg/dL (ref 1.7–2.4)

## 2022-02-18 MED ORDER — POTASSIUM PHOSPHATES 15 MMOLE/5ML IV SOLN
15.0000 mmol | Freq: Once | INTRAVENOUS | Status: AC
  Administered 2022-02-18: 15 mmol via INTRAVENOUS
  Filled 2022-02-18: qty 5

## 2022-02-18 NOTE — NC FL2 (Signed)
?[place] MEDICAID FL2 LEVEL OF CARE SCREENING TOOL  ?  ? ?IDENTIFICATION  ?Patient Name: ?[name] Birthdate: 01/06/1986 Sex: male Admission Date (Current Location): ?02/13/2022  ?[place] and [place] Number: ? [place] ?[id number] S Facility and Address:  ?[facility] [place],  [place] [place], [place] ?     Provider Number: ?[id number]  ?Attending Physician Name and Address:  ?Allie Bossier, MD ? Relative Name and Phone Number:  ?  ?   ?Current Level of Care: ?Hospital Recommended Level of Care: ?[place] Prior Approval Number: ?  ? ?Date Approved/Denied: ?  PASRR Number: ?pending ? ?Discharge Plan: ?SNF ?  ? ?Current Diagnoses: ?Patient Active Problem List  ? Diagnosis Date Noted  ? Hyperphosphatemia 02/17/2022  ? Alcoholic cirrhosis of liver ([place]) 02/17/2022  ? Chronic alcohol use 02/17/2022  ? Bipolar disorder ([place]) 02/17/2022  ? Sepsis ([place]) 02/13/2022  ? Malnutrition of moderate degree 10/20/2021  ? Palliative care by specialist   ? DNR (do not resuscitate) 10/14/2021  ? Aspiration pneumonia ([place]) 10/14/2021  ? FTT (failure to thrive) in adult 10/14/2021  ? Dysphagia 10/14/2021  ? Coagulopathy ([place]) 10/14/2021  ? Protein-calorie malnutrition, severe ([place]) 10/09/2021  ? Avascular necrosis of femoral head, left ([place]) 10/07/2021  ? Alcoholic cirrhosis of liver without ascites ([place]) 10/07/2021  ? Cellulitis of multiple sites of buttock 10/07/2021  ? Hypotension   ? Hypoxia 08/24/2021  ? Left hip pain   ? Transaminitis   ? Acute metabolic encephalopathy 23/30/0762  ? Cellulitis 05/07/2021  ? Lymphadenopathy 09/29/2020  ? Anemia   ? Upper urinary tract infection   ? Sepsis secondary to UTI ([place]) 03/14/2020  ? Cellulitis of groin 03/14/2020  ? HIV disease ([place])   ? Macrocytic anemia   ? Thrombocytopenia ([place])   ? Prolonged QT interval   ? Acute respiratory failure due to COVID-19 [facility]) 10/27/2019  ? GERD (gastroesophageal reflux disease) 10/27/2019  ? Hypokalemia 10/27/2019  ?  Peripheral neuropathy 10/01/2019  ? PTSD (post-traumatic stress disorder) 07/23/2018  ? Dizziness and giddiness 02/01/2016  ? Migraine headache 02/01/2016  ? Schizoaffective disorder, depressive type ([place]) 05/13/2015  ? Severe alcohol dependence ([place]) 05/13/2015  ? Suicidal ideation 01/12/2014  ? ? ?Orientation RESPIRATION BLADDER Height & Weight   ?  ?Self, Time, Situation, Place ? Normal Incontinent, External catheter Weight: 189 lb 9.5 oz (86 kg) ?Height:  6' 3"  (190.5 cm)  ?BEHAVIORAL SYMPTOMS/MOOD NEUROLOGICAL BOWEL NUTRITION STATUS  ?    Incontinent    ?AMBULATORY STATUS COMMUNICATION OF NEEDS Skin   ?Limited Assist Verbally Other (Comment) (unstageable wounds on both of his buttocks and sacrum) ?  ?  ?  ?    ?     ?     ? ? ?Personal Care Assistance Level of Assistance  ?Bathing, Dressing Bathing Assistance: Limited assistance ?  ?Dressing Assistance: Limited assistance ?   ? ?Functional Limitations Info  ?    ?  ?   ? ? ?SPECIAL CARE FACTORS FREQUENCY  ?OT (By licensed OT), PT (By licensed PT)   ?  ?PT Frequency: 5x/wk ?OT Frequency: 5x/wk ?  ?  ?  ?   ? ? ?Contractures Contractures Info: Not present  ? ? ?Additional Factors Info  ?Code Status, Allergies, Psychotropic Code Status Info: Full ?Allergies Info: Dapsone, Primaquine Phosphate ?Psychotropic Info: see MAR ?  ?  ?   ? ?Current Medications (02/18/2022):  This is the current hospital active medication list ?Current Facility-Administered Medications  ?Medication  Dose Route Frequency Provider Last Rate Last Admin  ? 0.9 %  sodium chloride infusion  250 mL Intravenous Continuous Rai, Ripudeep K, MD      ? acetaminophen (TYLENOL) tablet 650 mg  650 mg Oral Q6H PRN Allie Bossier, MD      ? albuterol (PROVENTIL) (2.5 MG/3ML) 0.083% nebulizer solution 2.5 mg  2.5 mg Inhalation Q6H PRN Rai, Ripudeep K, MD      ? bictegravir-emtricitabine-tenofovir AF (BIKTARVY) 50-200-25 MG per tablet 1 tablet  1 tablet Oral Daily Rai, Ripudeep K, MD   1 tablet at 02/18/22  0904  ? cefTRIAXone (ROCEPHIN) 2 g in sodium chloride 0.9 % 100 mL IVPB  2 g Intravenous Q24H Rai, Ripudeep K, MD 200 mL/hr at 02/17/22 1830 2 g at 02/17/22 1830  ? chlorhexidine (PERIDEX) 0.12 % solution 15 mL  15 mL Mouth Rinse BID Rai, Ripudeep K, MD   15 mL at 02/18/22 0904  ? Chlorhexidine Gluconate Cloth 2 % PADS 6 each  6 each Topical Daily Rai, Ripudeep K, MD   6 each at 02/18/22 0909  ? clonazePAM (KLONOPIN) tablet 0.5 mg  0.5 mg Oral BID Rai, Ripudeep K, MD   0.5 mg at 02/18/22 0905  ? diclofenac Sodium (VOLTAREN) 1 % topical gel 4 g  4 g Topical QID Allie Bossier, MD   4 g at 02/18/22 1411  ? diphenoxylate-atropine (LOMOTIL) 2.5-0.025 MG per tablet 1 tablet  1 tablet Oral Daily PRN Rai, Ripudeep K, MD      ? doxycycline (VIBRA-TABS) tablet 100 mg  100 mg Oral Q12H Rai, Ripudeep K, MD   100 mg at 02/18/22 0904  ? enoxaparin (LOVENOX) injection 40 mg  40 mg Subcutaneous Q24H Rai, Ripudeep K, MD   40 mg at 02/17/22 2201  ? famotidine (PEPCID) tablet 40 mg  40 mg Oral Daily Rai, Ripudeep K, MD   40 mg at 02/18/22 0905  ? FLUoxetine (PROZAC) capsule 40 mg  40 mg Oral Daily Suella Broad, FNP   40 mg at 02/18/22 8938  ? folic acid (FOLVITE) tablet 1 mg  1 mg Oral Daily Rai, Ripudeep K, MD   1 mg at 02/18/22 0905  ? haloperidol (HALDOL) tablet 5 mg  5 mg Oral Daily Suella Broad, FNP   5 mg at 02/18/22 1017  ? hydrocortisone sodium succinate (SOLU-CORTEF) 100 MG injection 25 mg  25 mg Intravenous Q12H Rai, Ripudeep K, MD   25 mg at 02/18/22 0557  ? lactulose (CHRONULAC) 10 GM/15ML solution 20 g  20 g Oral BID Rai, Ripudeep K, MD   20 g at 02/16/22 2200  ? linaclotide (LINZESS) capsule 145 mcg  145 mcg Oral QAC breakfast Rai, Ripudeep K, MD   145 mcg at 02/18/22 5102  ? lurasidone (LATUDA) tablet 120 mg  120 mg Oral Daily Suella Broad, FNP   120 mg at 02/18/22 5852  ? MEDLINE mouth rinse  15 mL Mouth Rinse q12n4p Rai, Ripudeep K, MD   15 mL at 02/15/22 1649  ? MEDLINE mouth rinse  15  mL Mouth Rinse q12n4p Rai, Ripudeep K, MD   15 mL at 02/17/22 1048  ? midodrine (PROAMATINE) tablet 10 mg  10 mg Oral TID WC Rai, Ripudeep K, MD   10 mg at 02/18/22 1410  ? multivitamin with minerals tablet 1 tablet  1 tablet Oral Daily Rai, Ripudeep K, MD   1 tablet at 02/18/22 0905  ? mupirocin ointment (BACTROBAN) 2 %  1 application.  1 application. Nasal BID Allie Bossier, MD   1 application. at 02/18/22 0903  ? nicotine (NICODERM CQ - dosed in mg/24 hours) patch 21 mg  21 mg Transdermal Daily Allie Bossier, MD   21 mg at 02/18/22 5170  ? pantoprazole (PROTONIX) EC tablet 40 mg  40 mg Oral Daily Rai, Ripudeep K, MD   40 mg at 02/18/22 0905  ? potassium PHOSPHATE 15 mmol in dextrose 5 % 250 mL infusion  15 mmol Intravenous Once Allie Bossier, MD 43 mL/hr at 02/18/22 1016 15 mmol at 02/18/22 1016  ? sodium chloride flush (NS) 0.9 % injection 10-40 mL  10-40 mL Intracatheter Q12H Rai, Ripudeep K, MD   20 mL at 02/18/22 0906  ? sodium chloride flush (NS) 0.9 % injection 10-40 mL  10-40 mL Intracatheter PRN Rai, Ripudeep K, MD      ? thiamine tablet 100 mg  100 mg Oral Daily Rai, Ripudeep K, MD   100 mg at 02/18/22 0905  ? Or  ? thiamine (B-1) injection 100 mg  100 mg Intravenous Daily Rai, Ripudeep K, MD   100 mg at 02/15/22 0910  ? traZODone (DESYREL) tablet 300 mg  300 mg Oral QHS PRN Suella Broad, FNP      ? valACYclovir (VALTREX) tablet 1,000 mg  1,000 mg Oral Daily Rai, Ripudeep K, MD   1,000 mg at 02/18/22 0174  ? ? ? ?Discharge Medications: ?Please see discharge summary for a list of discharge medications. ? ?Relevant Imaging Results: ? ?Relevant Lab Results: ? ? ?Additional Information ?SSN [id number] ? ?Jamez Ambrocio, LCSW ? ? ? ? ?

## 2022-02-18 NOTE — Progress Notes (Signed)
?PROGRESS NOTE ? ? ? ?[name]  IOE:[id number] DOB: April 08, 1986 DOA: 02/13/2022 ?PCP: [name], MD  ? ? ? ?Brief Narrative:  ?36 year old BM PMHx HIV, bipolar disorder, depression, PTSD, schizoaffective disorder, seizures ? ?Presented to ED via EMS after found by home health aide on the floor.  Patient was unable to provide any history due to altered mental status and somnolence.  Apparently patient had a unwitnessed fall out of bed and his home health aide called the EMS.  Patient was found to be hypotensive by EMS with BP 82/40, hypothermic with temp of 94.4 ?F.  In ED, briefly he was hypotensive with BP in 60s.  Patient was noted to have multiple medication and alcohol bottles spread around at home. 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 ?Patient has unstageable wounds on both of his buttocks and sacrum ? ? ?Subjective: ?5/12 afebrile overnight.  A/O x4.  Resting comfortably in bed.  States is now consuming all of his meals. He lives alone.  States has been evaluated for LEFT hip arthroplasty by [name] orthopedic surgery, but no scheduled date.  Sees Dr. [name] ID in [name].  Consumes 40 ounces beer daily, negative other alcohol or drugs. ? ? ?Assessment & Plan: ?Covid vaccination; ?  ?Principal Problem: ?  Sepsis ([name]) ?Active Problems: ?  Protein-calorie malnutrition, severe ([place]) ?  Schizoaffective disorder, depressive type ([name]) ?  HIV disease ([name]) ?  Prolonged QT interval ?  Acute metabolic encephalopathy ?  Alcoholic cirrhosis of liver without ascites ([name]) ?  Hyperphosphatemia ?  Alcoholic cirrhosis of liver ([name]) ?  Chronic alcohol use ?  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 intake, hypoalbuminemia, source possibly due to buttocks wound ?-CT chest abdomen pelvis did not show any acute chest or abdominal pathology. ?-UA showed trace leukocytes, positive nitrates, follow urine culture.  Blood cultures negative so far ?-Random cortisol level inconclusive as patient had already received high-dose hydrocortisone 100 mg IV x1.  Taper hydrocortisone to 25q12hrs, off tomorrow ?-BP improving, continue midodrine 10 mg 3 times daily ?--Albumin less than 1.5, received IV albumin 25g IV q6hrs x3 doses ?-Narrow antibiotics to IV Rocephin and doxycycline ?-Wound care following ?-5/11 Albumin 50 g x 1: Will help with patient's BP. ?  ?Acute metabolic encephalopathy ?-Acute due to #1, much more alert and oriented today ?-CT head negative for any intracranial abnormality ?-Continue doxycycline, IV Rocephin ?-Continue lactulose ?-Start PT, increase mobilization ?-5/10 resolved ?  ?HIV ?-Continue Biktarvy ? ?QT prolongation ?- 5/10 EKG; sinus arrhythmia, QTc prolonged 511 ?-5/11 monitor closely, limit QTc prolonging medication ?  ?  ?Severe protein calorie malnutrition ?-Albumin less than 1.5, appears to be cachectic ?-Received IV albumin x3 doses ?-Nutrition consult, encourage p.o. diet ?  ?History of bipolar disorder, schizoaffective disorder ?-Resume Klonopin due to previous history of catatonia  ?-Discussed with psychiatry on 5/8, resumed psych meds (recommended trazodone, Latuda, Haldol, Klonopin).   ?  ?  ?Normocytic Anemia  ?-Hemoglobin down to 7.7 today, no bleeding, likely hemodilution ?-DC IV fluids, encourage p.o. diet ?Lab Results  ?Component Value Date  ? HGB 7.3 (L) 02/18/2022  ? HGB 8.0 (L) 02/17/2022  ? HGB 8.2 (L) 02/16/2022  ? HGB 7.7 (L) 02/15/2022  ? HGB 9.1 (L) 02/14/2022  ?-5/12  occult blood negative ?- Transfuse for hemoglobin<7 ? ?Thrombocytosis ?- Reactive vs HIT ?- 5/12 DC all heparin containing products ?- 5/12 change to SCD for VTE prophylaxis ?-5/12 HIT antibody  pending ?  ?Chronic EtOH use/ETOH Liver cirrhosis ?-Placed on CIWA protocol, thiamine, folate ?-5/12 discussed with patient need to absolutely discontinue drinking. ? ?Hypokalemia ?-Potassium goal> 4 ?-5/12 see hypophosphatemia ? ?Hypophosphatemia ?- Phosphorus goal> 0.5 ?- 5/12 K-Phos 15 mmol ? ? ?Pressure Injury 02/13/22 Buttocks Bilateral (Active)  ?02/13/22 2030  ?Location: Buttocks  ?Location Orientation: Bilateral  ?Staging:   ?Wound Description (Comments):   ?Present on Admission: Yes  ?Dressing Type None 02/17/22 2201  ? ? ? ?  ? ? ?Mobility Assessment (last 72 hours)   ? ? Mobility Assessment   ? ? [place] Name 02/17/22 2201 02/16/22 2157 02/16/22 1234  ?  ?  ? Does patient have an order for bedrest or is patient medically unstable No - Continue assessment Yes- Bedfast (Level 1) - Complete --    ? What is the highest level of mobility based on the progressive mobility assessment? Level 3 (Stands with assist) - Balance while standing  and cannot march in place Level 1 (Bedfast) - Unable to balance while sitting on edge of bed Level 5 (Walks with assist in room/hall) - Balance while stepping forward/back and can walk in room with assist - Complete    ? Is the above level different from baseline mobility prior to current illness? Yes - Recommend PT order Yes - Recommend PT order --    ? ?  ?  ? ?  ? ?Goals of care ?- 5/10 palliative care consult: Multisystem organ failure, alcohol abuse, liver cirrhosis discuss change of CODE STATUS to DNR.  Appointment of HCPOA ? ?   ?DVT prophylaxis: Lovenox ?Code Status: Full ?Family Communication: 5/12 brother at bedside for discussion of plan of care all questions answered ?Status is: Inpatient ? ? ? ?Dispo: The patient is from: Home ?             Anticipated d/c is to:  home with HHPT and resumption of aide services  ?             Anticipated d/c date is: > 3 days ?             Patient currently is not medically stable to d/c. ? ? ? ? ? ?Consultants:  ?Palliative care ?Wound  care ? ? ?Procedures/Significant Events:  ? ? ?I have personally reviewed and interpreted all radiology studies and my findings are as above. ? ?VENTILATOR SETTINGS: ?Room air 5/12 ?SPO2 98% ? ? ?Cultures ? ? ?Antimicrobials: ?Anti-infectives (From admission, onward)  ? ? Start     Ordered Stop  ? 02/15/22 2200  doxycycline (VIBRA-TABS) tablet 100 mg       ? 02/15/22 1231    ? 02/15/22 1800  cefTRIAXone (ROCEPHIN) 2 g in sodium chloride 0.9 % 100 mL IVPB       ? 02/15/22 1231    ? 02/14/22 1200  bictegravir-emtricitabine-tenofovir AF (BIKTARVY) 50-200-25 MG per tablet 1 tablet       ? 02/14/22 1010    ? 02/14/22 1200  valACYclovir (VALTREX) tablet 1,000 mg       ? 02/14/22 1019    ? 02/14/22 0400  vancomycin (VANCOREADY) IVPB 1500 mg/300 mL  Status:  Discontinued       ? 02/13/22 1830 02/15/22 1231  ? 02/14/22 0200  ceFEPIme (MAXIPIME) 2 g in  sodium chloride 0.9 % 100 mL IVPB  Status:  Discontinued       ? 02/13/22 1830 02/15/22 1231  ? 02/13/22 2230  metroNIDAZOLE (FLAGYL) IVPB 500 mg  Status:  Discontinued       ? 02/13/22 2142 02/15/22 1231  ? 02/13/22 1530  ceFEPIme (MAXIPIME) 2 g in sodium chloride 0.9 % 100 mL IVPB       ? 02/13/22 1516 02/13/22 1908  ? 02/13/22 1530  vancomycin (VANCOREADY) IVPB 2000 mg/400 mL       ? 02/13/22 1525 02/13/22 1917  ? ?  ?  ? ? ?Devices ?  ? ?LINES / TUBES:  ? ? ? ? ?Continuous Infusions: ? sodium chloride    ? cefTRIAXone (ROCEPHIN)  IV 2 g (02/17/22 1830)  ? ? ? ?Objective: ?Vitals:  ? 02/17/22 1654 02/17/22 1709 02/17/22 2021 02/18/22 0603  ?BP: 110/88 109/88 109/88 110/89  ?Pulse: (!) 102 (!) 101 95 88  ?Resp: 15  18 20   ?Temp: 98.2 ?F (36.8 ?C) 98 ?F (36.7 ?C) 98.6 ?F (37 ?C) 97.7 ?F (36.5 ?C)  ?TempSrc: Oral Oral  Oral  ?SpO2: 100% 100% 99% 98%  ?Weight:      ?Height:      ? ? ?Intake/Output Summary (Last 24 hours) at 02/18/2022 0842 ?Last data filed at 02/17/2022 2206 ?Gross per 24 hour  ?Intake 1428 ml  ?Output 850 ml  ?Net 578 ml  ? ? ?Filed Weights  ? 02/13/22 1521   ?Weight: 86 kg  ? ? ?Examination: ? ?General: A/O x4 No acute respiratory distress, cachectic ?Eyes: negative scleral hemorrhage, negative anisocoria, negative icterus ?ENT: Negative Runny nose, negative

## 2022-02-18 NOTE — TOC Progression Note (Signed)
Transition of Care (TOC) - Progression Note  ? ? ?Patient Details  ?Name: [name] ?MRN: [id number] ?Date of Birth: 1986-08-17 ? ?Transition of Care (TOC) CM/SW Contact  ?Anjela Cassara, LCSW ?Phone Number: ?02/18/2022, 3:34 PM ? ?Clinical Narrative:    ?Met with pt today to follow up on therapy recommendations for SNF rehab.  Pt is in agreement with this plan and agrees not at a safe, functional level to return home yet.  Have begin SNF bed search process. ? ? ?Expected Discharge Plan: [place] ?Barriers to Discharge: Continued Medical Work up ? ?Expected Discharge Plan and Services ?Expected Discharge Plan: [place] ?In-house Referral: Clinical Social Work ?  ?Post Acute Care Choice: Home Health ?Living arrangements for the past 2 months: Apartment ?                ?  ?  ?  ?  ?  ?  ?  ?  ?  ?  ? ? ?Social Determinants of Health (SDOH) Interventions ?  ? ?Readmission Risk Interventions ? ?  02/15/2022  ?  2:35 PM 08/24/2021  ?  4:00 PM 08/24/2021  ? 11:54 AM  ?Readmission Risk Prevention Plan  ?Transportation Screening Complete Complete   ?PCP or Specialist Appt within 3-5 Days  Complete Complete  ?[place] or Home Care Consult  Complete Complete  ?Social Work Consult for [place] Planning/Counseling   Complete  ?Palliative Care Screening  Complete Complete  ?Medication Review Press photographer) Complete Complete Complete  ?PCP or Specialist appointment within 3-5 days of discharge Complete    ?[place] or Home Care Consult Complete    ?SW Recovery Care/Counseling Consult Complete    ?[place] Not Applicable    ? ? ?

## 2022-02-18 NOTE — Progress Notes (Signed)
Physical Therapy Treatment ?Patient Details ?Name: [name] ?MRN: [id number] ?DOB: 1985-12-26 ?Today's Date: 02/18/2022 ? ? ?History of Present Illness 36 year old male with HIV, bipolar disorder, depression, PTSD, schizoaffective disorder, seizures presented to ED via EMS after found by home health aide on the floor. Dx of severe sepsis with shock, buttock wounds. ? ?  ?PT Comments  ? ? Pt generally in poor spirits today and said "let's get this over with" upon entry. Refused to complete exercises. Required min guard for all mobility today for safety, no physical assist required. Ambulated with RW ~20f with recliner follow for safety, HR reached 144 so further ambulation deferred, pt asymptomatic. Discharge destination remains appropriate. We will continue to follow him acutely.  ?   ?Recommendations for follow up therapy are one component of a multi-disciplinary discharge planning process, led by the attending physician.  Recommendations may be updated based on patient status, additional functional criteria and insurance authorization. ? ?Follow Up Recommendations ? Skilled nursing-short term rehab (<3 hours/day) ?  ?  ?Assistance Recommended at Discharge Intermittent Supervision/Assistance  ?Patient can return home with the following Help with stairs or ramp for entrance;A lot of help with bathing/dressing/bathroom;Assistance with cooking/housework;A little help with bathing/dressing/bathroom;A little help with walking and/or transfers;Assist for transportation ?  ?Equipment Recommendations ? Other (comment) (TBD at SNF)  ?  ?Recommendations for Other Services   ? ? ?  ?Precautions / Restrictions Precautions ?Precautions: Fall;Other (comment) ?Precaution Comments: monitor HR, pt denies falls in past 6 months (was found on floor by HHA just prior to this admission) ?Restrictions ?Weight Bearing Restrictions: No  ?  ? ?Mobility ? Bed Mobility ?  ?  ?  ?  ?  ?  ?  ?General bed mobility comments: Pt in recliner  at start and end of session ?  ? ?Transfers ?Overall transfer level: Needs assistance ?Equipment used: Rolling walker (2 wheels) ?Transfers: Sit to/from Stand ?Sit to Stand: Min guard ?  ?  ?  ?  ?  ?General transfer comment: min/guard safety, no physical assist needed ?  ? ?Ambulation/Gait ?Ambulation/Gait assistance: Supervision ?Gait Distance (Feet): 20 Feet ?Assistive device: Rolling walker (2 wheels) ?Gait Pattern/deviations: Step-through pattern, Decreased stride length, Decreased dorsiflexion - right, Decreased dorsiflexion - left ?Gait velocity: decr ?  ?  ?General Gait Details: Pt ambulated with RW, supervision with recliner follow for safety, no overt LOB noted. Pt self-limited distance by saying "I'm done." and sitting in recliner. Demonstrated decreased DF bilaterally and pt almost "sliding" feet across floor for progression. Pt very far from AD, verbal cues for proximity to device. HR up to 144 while walking, pt asymptomatic. ? ? ?Stairs ?  ?  ?  ?  ?  ? ? ?Wheelchair Mobility ?  ? ?Modified Rankin (Stroke Patients Only) ?  ? ? ?  ?Balance Overall balance assessment: Needs assistance ?Sitting-balance support: Feet supported, No upper extremity supported ?Sitting balance-Leahy Scale: Good ?  ?  ?Standing balance support: Reliant on assistive device for balance, During functional activity, Bilateral upper extremity supported ?Standing balance-Leahy Scale: Poor ?  ?  ?  ?  ?  ?  ?  ?  ?  ?  ?  ?  ?  ? ?  ?Cognition Arousal/Alertness: Awake/alert ?Behavior During Therapy: [facility] tasks assessed/performed ?Overall Cognitive Status: No family/caregiver present to determine baseline cognitive functioning ?  ?  ?  ?  ?  ?  ?  ?  ?  ?  ?  ?  ?  ?  ?  ?  ?  General Comments: Pt reporting "you smell" and generally not in good mood. ?  ?  ? ?  ?Exercises   ? ?  ?General Comments   ?  ?  ? ?Pertinent Vitals/Pain Pain Assessment ?Pain Assessment: 0-10 ?Pain Score: 9  ?Pain Location: L hip ?Pain Descriptors /  Indicators: Sore ?Pain Intervention(s): Limited activity within patient's tolerance, Monitored during session, Repositioned  ? ? ?Home Living Family/patient expects to be discharged to:: Private residence ?Living Arrangements: Other relatives ?Available Help at Discharge: Family;Available PRN/intermittently;Personal care attendant ?Type of Home: Apartment ?Home Access: Stairs to enter ?Entrance Stairs-Rails: Right ?Entrance Stairs-Number of Steps: 3 ?  ?Home Layout: One level ?Home Equipment: Conservation officer, nature (2 wheels);Cane - single point;Wheelchair - manual;Hand held shower head;Tub bench;BSC/3in1;Hospital bed ?Additional Comments: Pt reports having a lift recliner.  ?  ?Prior Function    ?  ?  ?   ? ?PT Goals (current goals can now be found in the care plan section) Acute Rehab PT Goals ?Patient Stated Goal: return home ?PT Goal Formulation: With patient ?Time For Goal Achievement: 03/02/22 ?Potential to Achieve Goals: Good ?Progress towards PT goals: Progressing toward goals ? ?  ?Frequency ? ? ? Min 3X/week ? ? ? ?  ?PT Plan Current plan remains appropriate  ? ? ?Co-evaluation   ?  ?  ?  ?  ? ?  ?AM-PAC PT "6 Clicks" Mobility   ?Outcome Measure ? Help needed turning from your back to your side while in a flat bed without using bedrails?: None ?Help needed moving from lying on your back to sitting on the side of a flat bed without using bedrails?: A Little ?Help needed moving to and from a bed to a chair (including a wheelchair)?: A Little ?Help needed standing up from a chair using your arms (e.g., wheelchair or bedside chair)?: A Little ?Help needed to walk in hospital room?: A Little ?Help needed climbing 3-5 steps with a railing? : A Little ?6 Click Score: 19 ? ?  ?End of Session Equipment Utilized During Treatment: Gait belt ?Activity Tolerance: Treatment limited secondary to medical complications (Comment);No increased pain (tachycardia) ?Patient left: in chair;with call bell/phone within reach;with  nursing/sitter in room;with chair alarm set ?Nurse Communication: Mobility status ?PT Visit Diagnosis: Difficulty in walking, not elsewhere classified (R26.2);Pain ?Pain - Right/Left: Left ?Pain - part of body: Hip ?  ? ? ?Time: 1751-0258 ?PT Time Calculation (min) (ACUTE ONLY): 16 min ? ?Charges:  $Gait Training: 8-22 mins          ?          ?Coolidge Breeze, PT, DPT ?[facility] ?Office: [phone] ?Pager: [phone] ? ? ?Coolidge Breeze ?02/18/2022, 10:49 AM ? ?

## 2022-02-19 DIAGNOSIS — G9341 Metabolic encephalopathy: Secondary | ICD-10-CM | POA: Diagnosis not present

## 2022-02-19 DIAGNOSIS — K7030 Alcoholic cirrhosis of liver without ascites: Secondary | ICD-10-CM | POA: Diagnosis not present

## 2022-02-19 DIAGNOSIS — F310 Bipolar disorder, current episode hypomanic: Secondary | ICD-10-CM | POA: Diagnosis not present

## 2022-02-19 DIAGNOSIS — A419 Sepsis, unspecified organism: Secondary | ICD-10-CM | POA: Diagnosis not present

## 2022-02-19 LAB — CBC WITH DIFFERENTIAL/PLATELET
Abs Immature Granulocytes: 0.13 10*3/uL — ABNORMAL HIGH (ref 0.00–0.07)
Basophils Absolute: 0 10*3/uL (ref 0.0–0.1)
Basophils Relative: 0 %
Eosinophils Absolute: 0.1 10*3/uL (ref 0.0–0.5)
Eosinophils Relative: 1 %
HCT: 22.6 % — ABNORMAL LOW (ref 39.0–52.0)
Hemoglobin: 7 g/dL — ABNORMAL LOW (ref 13.0–17.0)
Immature Granulocytes: 1 %
Lymphocytes Relative: 29 %
Lymphs Abs: 2.9 10*3/uL (ref 0.7–4.0)
MCH: 31.3 pg (ref 26.0–34.0)
MCHC: 31 g/dL (ref 30.0–36.0)
MCV: 100.9 fL — ABNORMAL HIGH (ref 80.0–100.0)
Monocytes Absolute: 0.8 10*3/uL (ref 0.1–1.0)
Monocytes Relative: 8 %
Neutro Abs: 6.3 10*3/uL (ref 1.7–7.7)
Neutrophils Relative %: 61 %
Platelets: 111 10*3/uL — ABNORMAL LOW (ref 150–400)
RBC: 2.24 MIL/uL — ABNORMAL LOW (ref 4.22–5.81)
RDW: 17.4 % — ABNORMAL HIGH (ref 11.5–15.5)
WBC: 10.2 10*3/uL (ref 4.0–10.5)
nRBC: 0 % (ref 0.0–0.2)

## 2022-02-19 LAB — HEPARIN INDUCED PLATELET AB (HIT ANTIBODY): Heparin Induced Plt Ab: 0.123 OD (ref 0.000–0.400)

## 2022-02-19 LAB — COMPREHENSIVE METABOLIC PANEL
ALT: 12 U/L (ref 0–44)
AST: 11 U/L — ABNORMAL LOW (ref 15–41)
Albumin: 2.2 g/dL — ABNORMAL LOW (ref 3.5–5.0)
Alkaline Phosphatase: 74 U/L (ref 38–126)
Anion gap: 5 (ref 5–15)
BUN: 11 mg/dL (ref 6–20)
CO2: 26 mmol/L (ref 22–32)
Calcium: 7.8 mg/dL — ABNORMAL LOW (ref 8.9–10.3)
Chloride: 108 mmol/L (ref 98–111)
Creatinine, Ser: 0.89 mg/dL (ref 0.61–1.24)
GFR, Estimated: >60 mL/min (ref >60)
Glucose, Bld: 99 mg/dL (ref 70–99)
Potassium: 3.3 mmol/L — ABNORMAL LOW (ref 3.5–5.1)
Sodium: 139 mmol/L (ref 135–145)
Total Bilirubin: 0.5 mg/dL (ref 0.3–1.2)
Total Protein: 6.2 g/dL — ABNORMAL LOW (ref 6.5–8.1)

## 2022-02-19 LAB — MAGNESIUM: Magnesium: 1.7 mg/dL (ref 1.7–2.4)

## 2022-02-19 LAB — CULTURE, BLOOD (ROUTINE X 2)
Culture: NO GROWTH
Special Requests: ADEQUATE

## 2022-02-19 LAB — PHOSPHORUS: Phosphorus: 2 mg/dL — ABNORMAL LOW (ref 2.5–4.6)

## 2022-02-19 LAB — PREPARE RBC (CROSSMATCH)

## 2022-02-19 MED ORDER — SODIUM CHLORIDE 0.9% IV SOLUTION: Freq: Once | INTRAVENOUS | Status: AC

## 2022-02-19 MED ORDER — POTASSIUM PHOSPHATES 15 MMOLE/5ML IV SOLN
30.0000 mmol | Freq: Once | INTRAVENOUS | Status: AC
  Administered 2022-02-19: 30 mmol via INTRAVENOUS
  Filled 2022-02-19: qty 10

## 2022-02-19 NOTE — Progress Notes (Signed)
Pt refused solu cortef. Provided teachings and advised pt that it is a high priority med, pt still refused and refused wound care. Advised importance of that as well pt still refused. ?

## 2022-02-19 NOTE — Progress Notes (Signed)
?PROGRESS NOTE ? ? ? ?[name]  BWI:[id number] DOB: [birth year] DOA: 02/13/2022 ?PCP: [name], MD  ? ? ? ?Brief Narrative:  ?36 year old BM PMHx HIV, bipolar disorder, depression, PTSD, schizoaffective disorder, seizures ? ?Presented to ED via EMS after found by home health aide on the floor.  Patient was unable to provide any history due to altered mental status and somnolence.  Apparently patient had a unwitnessed fall out of bed and his home health aide called the EMS.  Patient was found to be hypotensive by EMS with BP 82/40, hypothermic with temp of 94.4 ?F.  In ED, briefly he was hypotensive with BP in 60s.  Patient was noted to have multiple medication and alcohol bottles spread around at home. 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 ?Patient has unstageable wounds on both of his buttocks and sacrum ? ? ?Subjective: ?5/13 A/O x4 sitting in bed comfortably.  Patient refusing medications. He lives alone.  States has been evaluated for LEFT hip arthroplasty by [name] orthopedic surgery, but no scheduled date.  Sees Dr. [name] ID in [place].  Consumes 40 ounces beer daily, negative other alcohol or drugs. ? ? ?Assessment & Plan: ?Covid vaccination; ?  ?Principal Problem: ?  Sepsis ([place]) ?Active Problems: ?  Protein-calorie malnutrition, severe ([place]) ?  Schizoaffective disorder, depressive type ([place]) ?  HIV disease ([place]) ?  Prolonged QT interval ?  Acute metabolic encephalopathy ?  Alcoholic cirrhosis of liver without ascites ([place]) ?  Hyperphosphatemia ?  Alcoholic cirrhosis of liver ([place]) ?  Chronic alcohol use ?  Bipolar disorder ([place]) ? 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 intake,  hypoalbuminemia, source possibly due to buttocks wound ?-CT chest abdomen pelvis did not show any acute chest or abdominal pathology. ?-UA showed trace leukocytes, positive nitrates, follow urine culture.  Blood cultures negative so far ?-Random cortisol level inconclusive as patient had already received high-dose hydrocortisone 100 mg IV x1.  Taper hydrocortisone to 25q12hrs, off tomorrow ?-BP improving, continue midodrine 10 mg 3 times daily ?--Albumin less than 1.5, received IV albumin 25g IV q6hrs x3 doses ?-Narrow antibiotics to IV Rocephin and doxycycline ?-Wound care following ?-5/11 Albumin 50 g x 1: Will help with patient's BP. ?  ?Acute metabolic encephalopathy ?-Acute due to #1, much more alert and oriented today ?-CT head negative for any intracranial abnormality ?-Continue doxycycline, IV Rocephin ?-Continue lactulose ?-Start PT, increase mobilization ?-5/10 resolved ?  ?HIV ?-Continue Biktarvy ? ?Hypotension ?-5/9 continue Solu-Cortef IV 25 mg ?-5/11 Albumin 50 g x 1: Will help with patient's BP ?-5/13 refusing Solu-Cortef ? ?QT prolongation ?- 5/10 EKG; sinus arrhythmia, QTc prolonged 511 ?-5/11 monitor closely, limit QTc prolonging medication ?  ?  ?Severe protein calorie malnutrition ?-Albumin less than 1.5, appears to be cachectic ?-Received IV albumin x3 doses ?-Nutrition consult, encourage p.o. diet ?  ?History of bipolar disorder, schizoaffective disorder ?-Resume Klonopin due to previous history of catatonia  ?-Discussed with psychiatry on 5/8, resumed psych meds (recommended trazodone, Latuda, Haldol, Klonopin).   ?  ?  ?Normocytic Anemia  ?-Hemoglobin down to 7.7 today, no bleeding, likely hemodilution ?-DC IV fluids, encourage p.o. diet ?Lab Results  ?Component Value Date  ? HGB 7.0 (L) 02/19/2022  ? HGB 7.3 (L) 02/18/2022  ? HGB 8.0 (L) 02/17/2022  ?  HGB 8.2 (L) 02/16/2022  ? HGB 7.7 (L) 02/15/2022  ?-5/12 occult blood negative ?- Transfuse for hemoglobin<7 ?-5/13 transfuse 1 unit  PRBC ? ?Thrombocytosis ?- Reactive vs HIT ?- 5/12 DC all heparin containing products ?- 5/12 change to SCD for VTE prophylaxis ?-5/12 HIT antibody pending ?  ?Chronic EtOH use/ETOH Liver cirrhosis ?-Placed on CIWA protocol, thiamine, folate ?-5/12 discussed with patient need to absolutely discontinue drinking. ? ?Hypokalemia ?-Potassium goal> 4 ?-5/13 see hypophosphatemia ? ?Hypophosphatemia ?- Phosphorus goal> 0.5 ?-5/13 K-Phos 30 mmol ? ?Pressure Injury 02/13/22 Buttocks Bilateral (Active)  ?02/13/22 2030  ?Location: Buttocks  ?Location Orientation: Bilateral  ?Staging:   ?Wound Description (Comments):   ?Present on Admission: Yes  ?Dressing Type Foam - Lift dressing to assess site every shift 02/19/22 0830  ? ? ?Interdisciplinary Goals of Care Family Meeting ? ? ?Date carried out: 02/19/2022 ? ?Location of the meeting: Bedside ? ?Member's involved: Dr. Sherral Hammers, patient ? ?Durable Power of Tour manager:    ? ?Discussion: We discussed goals of care for [place] .  Patient now refusing medications to include Solu-Cortef, blood, wound care.  Spoke with patient at length that he had the right to refuse any diagnostic or therapeutic treatment, however in his case he was at high risk of DEATH.  Patient stated he understood, and that he wanted to be made DNR. ? ?Code status: DNR ? ?Disposition: Continue current acute care ? ?Time spent for the meeting: 15 minutes  ? ? ? ?Piccini, [name], MD ? ?02/19/2022, 4:11 PM ?  ? ?  ? ? ?Mobility Assessment (last 72 hours)   ? ? Mobility Assessment   ? ? [place] Name 02/18/22 1041 02/17/22 2201 02/16/22 2157  ?  ?  ? Does patient have an order for bedrest or is patient medically unstable -- No - Continue assessment Yes- Bedfast (Level 1) - Complete    ? What is the highest level of mobility based on the progressive mobility assessment? Level 5 (Walks with assist in room/hall) - Balance while stepping forward/back and can walk in room with assist -  Complete Level 3 (Stands with assist) - Balance while standing  and cannot march in place Level 1 (Bedfast) - Unable to balance while sitting on edge of bed    ? Is the above level different from baseline mobility prior to current illness? -- Yes - Recommend PT order Yes - Recommend PT order    ? ?  ?  ? ?  ? ?Goals of care ?- 5/10 palliative care consult: Multisystem organ failure, alcohol abuse, liver cirrhosis discuss change of CODE STATUS to DNR.  Appointment of HCPOA ? ?   ?DVT prophylaxis: Lovenox ?Code Status: DNR ?Family Communication: 5/12 brother at bedside for discussion of plan of care all questions answered ?Status is: Inpatient ? ? ? ?Dispo: The patient is from: Home ?             Anticipated d/c is to:  home with HHPT and resumption of aide services  ?             Anticipated d/c date is: > 3 days ?             Patient currently is not medically stable to d/c. ? ? ? ? ? ?Consultants:  ?Palliative care ?Wound care ? ? ?Procedures/Significant Events:  ? ? ?I have personally reviewed and interpreted all radiology studies and my findings are as above. ? ?VENTILATOR  SETTINGS: ?Room air 5/13 ?SPO2 98% ? ? ?Cultures ? ? ?Antimicrobials: ?Anti-infectives (From admission, onward)  ? ? Start     Ordered Stop  ? 02/15/22 2200  doxycycline (VIBRA-TABS) tablet 100 mg       ? 02/15/22 1231    ? 02/15/22 1800  cefTRIAXone (ROCEPHIN) 2 g in sodium chloride 0.9 % 100 mL IVPB       ? 02/15/22 1231    ? 02/14/22 1200  bictegravir-emtricitabine-tenofovir AF (BIKTARVY) 50-200-25 MG per tablet 1 tablet       ? 02/14/22 1010    ? 02/14/22 1200  valACYclovir (VALTREX) tablet 1,000 mg       ? 02/14/22 1019    ? 02/14/22 0400  vancomycin (VANCOREADY) IVPB 1500 mg/300 mL  Status:  Discontinued       ? 02/13/22 1830 02/15/22 1231  ? 02/14/22 0200  ceFEPIme (MAXIPIME) 2 g in sodium chloride 0.9 % 100 mL IVPB  Status:  Discontinued       ? 02/13/22 1830 02/15/22 1231  ? 02/13/22 2230  metroNIDAZOLE (FLAGYL) IVPB 500 mg  Status:   Discontinued       ? 02/13/22 2142 02/15/22 1231  ? 02/13/22 1530  ceFEPIme (MAXIPIME) 2 g in sodium chloride 0.9 % 100 mL IVPB       ? 02/13/22 1516 02/13/22 1908  ? 02/13/22 1530  vancomycin (VANCOREADY) IVPB 2000 mg/400 mL

## 2022-02-19 NOTE — Progress Notes (Signed)
In the middle of med pass pt asked said," will you leave after I take that?" I explained he still had a couple more and needed to complete the assessment. He shook his head no. I asked what he was shaking his head no for, and he stated," you can go now" I asked him if he did not like me or if he just doesn't like people and if it was okay  to listen to him and give the rest of his meds, and reminded him of his dressing change he took his lactoluse and then the mouth wash, which instead of spitting out into cup provided, he spat it on his tray, an repeated, "you can go" I asked him again, "okay so you do not want to allow me to give you the rest of your meds or listen or assess you, or provide your dressing change? And he continued to shake his side to side and repeated, "No, you can go". I advised him I would let the Dr. Gwyndolyn Kaufman he refused pt care and he agreed. ?Informed charge of situation. As well as On Call as 2 of the meds he refused were red meds. No new orders placed or response from on call on how to move forward with pt. ?Will attempt assessment and dressing change again. ?

## 2022-02-20 DIAGNOSIS — Z91148 Patient's other noncompliance with medication regimen for other reason: Secondary | ICD-10-CM

## 2022-02-20 DIAGNOSIS — Z7189 Other specified counseling: Secondary | ICD-10-CM | POA: Diagnosis not present

## 2022-02-20 DIAGNOSIS — K7030 Alcoholic cirrhosis of liver without ascites: Secondary | ICD-10-CM | POA: Diagnosis not present

## 2022-02-20 DIAGNOSIS — A419 Sepsis, unspecified organism: Secondary | ICD-10-CM | POA: Diagnosis not present

## 2022-02-20 DIAGNOSIS — R531 Weakness: Secondary | ICD-10-CM | POA: Diagnosis not present

## 2022-02-20 DIAGNOSIS — F310 Bipolar disorder, current episode hypomanic: Secondary | ICD-10-CM | POA: Diagnosis not present

## 2022-02-20 DIAGNOSIS — G9341 Metabolic encephalopathy: Secondary | ICD-10-CM | POA: Diagnosis not present

## 2022-02-20 DIAGNOSIS — Z515 Encounter for palliative care: Secondary | ICD-10-CM | POA: Diagnosis not present

## 2022-02-20 LAB — COMPREHENSIVE METABOLIC PANEL
ALT: 13 U/L (ref 0–44)
AST: 14 U/L — ABNORMAL LOW (ref 15–41)
Albumin: 2.3 g/dL — ABNORMAL LOW (ref 3.5–5.0)
Alkaline Phosphatase: 77 U/L (ref 38–126)
Anion gap: 6 (ref 5–15)
BUN: 10 mg/dL (ref 6–20)
CO2: 26 mmol/L (ref 22–32)
Calcium: 8 mg/dL — ABNORMAL LOW (ref 8.9–10.3)
Chloride: 111 mmol/L (ref 98–111)
Creatinine, Ser: 0.76 mg/dL (ref 0.61–1.24)
GFR, Estimated: >60 mL/min (ref >60)
Glucose, Bld: 91 mg/dL (ref 70–99)
Potassium: 3.8 mmol/L (ref 3.5–5.1)
Sodium: 143 mmol/L (ref 135–145)
Total Bilirubin: 0.9 mg/dL (ref 0.3–1.2)
Total Protein: 6.4 g/dL — ABNORMAL LOW (ref 6.5–8.1)

## 2022-02-20 LAB — CBC WITH DIFFERENTIAL/PLATELET
Abs Immature Granulocytes: 0.08 10*3/uL — ABNORMAL HIGH (ref 0.00–0.07)
Basophils Absolute: 0 10*3/uL (ref 0.0–0.1)
Basophils Relative: 0 %
Eosinophils Absolute: 0.1 10*3/uL (ref 0.0–0.5)
Eosinophils Relative: 1 %
HCT: 25.4 % — ABNORMAL LOW (ref 39.0–52.0)
Hemoglobin: 8.1 g/dL — ABNORMAL LOW (ref 13.0–17.0)
Immature Granulocytes: 1 %
Lymphocytes Relative: 24 %
Lymphs Abs: 2.6 10*3/uL (ref 0.7–4.0)
MCH: 31.9 pg (ref 26.0–34.0)
MCHC: 31.9 g/dL (ref 30.0–36.0)
MCV: 100 fL (ref 80.0–100.0)
Monocytes Absolute: 0.8 10*3/uL (ref 0.1–1.0)
Monocytes Relative: 8 %
Neutro Abs: 7.3 10*3/uL (ref 1.7–7.7)
Neutrophils Relative %: 66 %
Platelets: 117 10*3/uL — ABNORMAL LOW (ref 150–400)
RBC: 2.54 MIL/uL — ABNORMAL LOW (ref 4.22–5.81)
RDW: 17.7 % — ABNORMAL HIGH (ref 11.5–15.5)
WBC: 10.9 10*3/uL — ABNORMAL HIGH (ref 4.0–10.5)
nRBC: 0 % (ref 0.0–0.2)

## 2022-02-20 LAB — MAGNESIUM: Magnesium: 1.6 mg/dL — ABNORMAL LOW (ref 1.7–2.4)

## 2022-02-20 LAB — PHOSPHORUS: Phosphorus: 3.2 mg/dL (ref 2.5–4.6)

## 2022-02-20 MED ORDER — MAGNESIUM SULFATE 50 % IJ SOLN
3.0000 g | Freq: Once | INTRAVENOUS | Status: AC
  Administered 2022-02-20: 3 g via INTRAVENOUS
  Filled 2022-02-20: qty 6

## 2022-02-20 NOTE — Progress Notes (Signed)
Dr. Sherral Hammers notified of patient refusing medications. Voiced understanding. ?

## 2022-02-20 NOTE — Progress Notes (Signed)
? ?                                                                                                                                                     ?                                                   ?Daily Progress Note  ? ?Patient Name: [name]       Date: 02/20/2022 ?DOB: 10/03/1986  Age: 36 y.o. MRN#: [id number] ?Attending Physician: Allie Bossier, MD ?Primary Care Physician: [name], MD ?Admit Date: 02/13/2022 ? ?Reason for Consultation/Follow-up: Establishing goals of care ? ?Subjective: ?Awake alert resting in bed, tracks me in the room, doesn't verbalize much today, he is in no distress, ate about 40% of his lunch.   ? ?Length of Stay: 7 ? ?Current Medications: ?Scheduled Meds:  ? bictegravir-emtricitabine-tenofovir AF  1 tablet Oral Daily  ? chlorhexidine  15 mL Mouth Rinse BID  ? Chlorhexidine Gluconate Cloth  6 each Topical Daily  ? clonazePAM  0.5 mg Oral BID  ? diclofenac Sodium  4 g Topical QID  ? famotidine  40 mg Oral Daily  ? FLUoxetine  40 mg Oral Daily  ? folic acid  1 mg Oral Daily  ? haloperidol  5 mg Oral Daily  ? hydrocortisone sod succinate (SOLU-CORTEF) inj  25 mg Intravenous Q12H  ? lactulose  20 g Oral BID  ? linaclotide  145 mcg Oral QAC breakfast  ? lurasidone  120 mg Oral Daily  ? mouth rinse  15 mL Mouth Rinse q12n4p  ? mouth rinse  15 mL Mouth Rinse q12n4p  ? midodrine  10 mg Oral TID WC  ? multivitamin with minerals  1 tablet Oral Daily  ? mupirocin ointment  1 application. Nasal BID  ? nicotine  21 mg Transdermal Daily  ? pantoprazole  40 mg Oral Daily  ? sodium chloride flush  10-40 mL Intracatheter Q12H  ? thiamine  100 mg Oral Daily  ? Or  ? thiamine  100 mg Intravenous Daily  ? valACYclovir  1,000 mg Oral Daily  ? ? ?Continuous Infusions: ? sodium chloride 250 mL (02/20/22 0509)  ? magnesium sulfate bolus IVPB 3 g (02/20/22 1210)  ? ? ?PRN Meds: ?acetaminophen, albuterol, diphenoxylate-atropine, sodium chloride flush, trazodone ? ?Physical Exam         ?Awake  alert ?Doesn't verbalize much ?Has a blank stare ?Follows commands ?No distress ?No edema ?Regular work of breathing ? ?Vital Signs: BP 118/88 (BP Location: Left Arm)   Pulse 88   Temp 97.6 ?F (36.4 ?C) (Oral)   Resp 17   Ht 6' 3"  (  1.905 m)   Wt 86 kg   SpO2 99%   BMI 23.70 kg/m?  ?SpO2: SpO2: 99 % ?O2 Device: O2 Device: Room Air ?O2 Flow Rate: O2 Flow Rate (L/min): 3 L/min ? ?Intake/output summary:  ?Intake/Output Summary (Last 24 hours) at 02/20/2022 1244 ?Last data filed at 02/20/2022 0700 ?Gross per 24 hour  ?Intake 820 ml  ?Output 300 ml  ?Net 520 ml  ? ?LBM: Last BM Date : 02/18/22 ?Baseline Weight: Weight: 86 kg ?Most recent weight: Weight: 86 kg ? ?     ?Palliative Assessment/Data: ? ? ? ? ? ?Patient Active Problem List  ? Diagnosis Date Noted  ? Hyperphosphatemia 02/17/2022  ? Alcoholic cirrhosis of liver ([place]) 02/17/2022  ? Chronic alcohol use 02/17/2022  ? Bipolar disorder ([place]) 02/17/2022  ? Sepsis ([place]) 02/13/2022  ? Malnutrition of moderate degree 10/20/2021  ? Palliative care by specialist   ? DNR (do not resuscitate) 10/14/2021  ? Aspiration pneumonia ([place]) 10/14/2021  ? FTT (failure to thrive) in adult 10/14/2021  ? Dysphagia 10/14/2021  ? Coagulopathy ([place]) 10/14/2021  ? Protein-calorie malnutrition, severe ([place]) 10/09/2021  ? Avascular necrosis of femoral head, left ([place]) 10/07/2021  ? Alcoholic cirrhosis of liver without ascites ([place]) 10/07/2021  ? Cellulitis of multiple sites of buttock 10/07/2021  ? Hypotension   ? Hypoxia 08/24/2021  ? Left hip pain   ? Transaminitis   ? Acute metabolic encephalopathy 41/74/0814  ? Cellulitis 05/07/2021  ? Lymphadenopathy 09/29/2020  ? Anemia   ? Upper urinary tract infection   ? Sepsis secondary to UTI ([place]) 03/14/2020  ? Cellulitis of groin 03/14/2020  ? HIV disease ([place])   ? Macrocytic anemia   ? Thrombocytopenia ([place])   ? Prolonged QT interval   ? Acute respiratory failure due to COVID-19 [facility]) 10/27/2019  ? GERD (gastroesophageal reflux disease)  10/27/2019  ? Hypokalemia 10/27/2019  ? Peripheral neuropathy 10/01/2019  ? PTSD (post-traumatic stress disorder) 07/23/2018  ? Dizziness and giddiness 02/01/2016  ? Migraine headache 02/01/2016  ? Schizoaffective disorder, depressive type ([place]) 05/13/2015  ? Severe alcohol dependence ([place]) 05/13/2015  ? Suicidal ideation 01/12/2014  ? ? ?Palliative Care Assessment & Plan  ? ?Patient Profile: ?  ? ?Assessment: ? 36 year old BM PMHx HIV, bipolar disorder, depression, PTSD, schizoaffective disorder, seizures.  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 ?Recommendations/Plan: ? Agree with DNR ?Monitor hospital course ?Recommend SNF rehab with palliative.  ?  ?Code Status: ? ?  ?Code Status Orders  ?(From admission, onward)  ?  ? ? ?  ? ?  Start     Ordered  ? 02/19/22 1609  Do not attempt resuscitation (DNR)  Continuous       ?Question Answer Comment  ?In the event of cardiac or respiratory ARREST Do not call a ?code blue?   ?In the event of cardiac or respiratory ARREST Do not perform Intubation, CPR, defibrillation or ACLS   ?In the event of cardiac or respiratory ARREST Use medication by any route, position, wound care, and other measures to relive pain and suffering. May use oxygen, suction and manual treatment of airway obstruction as needed for comfort.   ?  ? 02/19/22 1608  ? ?  ?  ? ?  ? ?Code Status History   ? ? Date Active Date Inactive Code Status Order ID Comments User Context  ? 02/13/2022 2142 02/19/2022 1608 Full Code [id number]  Rai, Vernelle Emerald,  MD Inpatient  ? 10/14/2021 1207 11/04/2021 1705 DNR [id number]  [name], NP Inpatient  ? 10/07/2021 0228 10/14/2021 1206 Full Code [id number]  [name], MD ED  ? 08/24/2021 0200 09/01/2021 0258 Full Code [id number]  [name], MD ED  ? 05/07/2021 1912 05/19/2021 1841 Full Code [id number]  [name], MD ED  ? 03/15/2020 0606 03/20/2020  1650 Full Code [id number]  [name], MD ED  ? 10/27/2019 1815 11/02/2019 1902 Full Code [id number]  [name], MD Inpatient  ? 07/22/2018 1722 07/22/2018 2158 Full Code [id number]  [name], NP Inpatient  ? 07/22/2018 0832 07/22/2018 1703 Full Code [id number]  [name], MD ED  ? 05/12/2015 1753 05/15/2015 1723 Full Code [id number]  [name], NP Inpatient  ? 05/12/2015 0546 05/12/2015 1753 Full Code [id number]  [name], MD ED  ? 01/12/2014 0256 01/12/2014 1650 Full Code [id number]  [name], MD ED  ? ?  ? ? ?Prognosis: ? Unable to determine ? ?Discharge Planning: ?[place] for rehab with Palliative care service follow-up ? ?Care plan was discussed with  patient ? ?Thank you for allowing the Palliative Medicine Team to assist in the care of this patient. ? ? ?Time In: 12 Time Out: 12.25 Total Time 25 Prolonged Time Billed  no   ? ?   ?Greater than 50%  of this time was spent counseling and coordinating care related to the above assessment and plan. ? ?Loistine Chance, MD ? ?Please contact Palliative Medicine Team phone at [phone] for questions and concerns.  ? ? ? ? ? ?

## 2022-02-20 NOTE — Progress Notes (Signed)
?PROGRESS NOTE ? ? ? ?[name]  FVC:[id number] DOB: Nov 17, 1985 DOA: 02/13/2022 ?PCP: [name], MD  ? ? ? ?Brief Narrative:  ?36 year old BM PMHx HIV, bipolar disorder, depression, PTSD, schizoaffective disorder, seizures ? ?Presented to ED via EMS after found by home health aide on the floor.  Patient was unable to provide any history due to altered mental status and somnolence.  Apparently patient had a unwitnessed fall out of bed and his home health aide called the EMS.  Patient was found to be hypotensive by EMS with BP 82/40, hypothermic with temp of 94.4 ?F.  In ED, briefly he was hypotensive with BP in 60s.  Patient was noted to have multiple medication and alcohol bottles spread around at home. 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 ?Patient has unstageable wounds on both of his buttocks and sacrum ? ? ?Subjective: ?5/14 contacted again by RN Buford Dresser that patient is refusing his medications again.  Patient alert however refuses to speak to me today.  We will nod his head yes and no to questions.  Did cooperate with exam. He lives alone.  States has been evaluated for LEFT hip arthroplasty by [name] orthopedic surgery, but no scheduled date.  Sees Dr. [name] ID in [place].  Consumes 40 ounces beer daily, negative other alcohol or drugs. ? ? ?Assessment & Plan: ?Covid vaccination; ?  ?Principal Problem: ?  Sepsis ([place]) ?Active Problems: ?  Protein-calorie malnutrition, severe ([place]) ?  Schizoaffective disorder, depressive type ([place]) ?  HIV disease ([place]) ?  Prolonged QT interval ?  Acute metabolic encephalopathy ?  Alcoholic cirrhosis of liver without ascites ([place]) ?  Hyperphosphatemia ?  Alcoholic cirrhosis of liver ([place]) ?  Chronic alcohol use ?  Bipolar disorder ([place]) ?  Noncompliance with medications ? ?Severe sepsis with septic shock/Bilateral buttocks wounds due to  hidradenitis ?-Patient presented with hypotension, hypothermia, leukocytosis, lactic acidosis, hypothermia, possibly also has hypovolemia with poor p.o. intake, hypoalbuminemia, source possibly due to buttocks wound ?-CT chest abdomen pelvis did not show any acute chest or abdominal pathology. ?-UA showed trace leukocytes, positive nitrates, follow urine culture.  Blood cultures negative so far ?-Random cortisol level inconclusive as patient had already received high-dose hydrocortisone 100 mg IV x1.  Taper hydrocortisone to 25q12hrs, off tomorrow ?-BP improving, continue midodrine 10 mg 3 times daily ?--Albumin less than 1.5, received IV albumin 25g IV q6hrs x3 doses ?-Narrow antibiotics to IV Rocephin and doxycycline ?-Wound care following ?-5/11 Albumin 50 g x 1: Will help with patient's BP. ?  ?Acute metabolic encephalopathy ?-Acute due to #1, much more alert and oriented today ?-CT head negative for any intracranial abnormality ?-Continue doxycycline, IV Rocephin ?-Continue lactulose ?-Start PT, increase mobilization ?-5/10 resolved ?  ?HIV ?-Continue Biktarvy ? ?Hypotension ?-5/9 continue Solu-Cortef IV 25 mg ?-5/11 Albumin 50 g x 1: Will help with patient's BP ?-5/13 refusing Solu-Cortef ? ?QT prolongation ?- 5/10 EKG; sinus arrhythmia, QTc prolonged 511 ?-5/11 monitor closely, limit QTc prolonging medication ?  ?Severe protein calorie malnutrition ?-Albumin less than 1.5, appears to be cachectic ?-Received IV albumin x3 doses ?-Nutrition consult, encourage p.o. diet ?  ?History of bipolar disorder, schizoaffective disorder ?-Resume Klonopin due to previous history of catatonia  ?-Discussed with psychiatry on 5/8, resumed psych meds (recommended trazodone, Latuda, Haldol, Klonopin).   ?  ?  ?Normocytic Anemia  ?-Hemoglobin down to 7.7 today, no bleeding, likely  hemodilution ?-DC IV fluids, encourage p.o. diet ?Lab Results  ?Component Value Date  ? HGB 8.1 (L) 02/20/2022  ? HGB 7.0 (L) 02/19/2022  ? HGB 7.3  (L) 02/18/2022  ? HGB 8.0 (L) 02/17/2022  ? HGB 8.2 (L) 02/16/2022  ?-5/12 occult blood negative ?- Transfuse for hemoglobin<7 ?-5/13 transfuse 1 unit PRBC ? ?Thrombocytosis ?- Reactive vs HIT ?- 5/12 DC all heparin containing products ?- 5/12 change to SCD for VTE prophylaxis ?-5/12 HIT antibody negative ?  ?Chronic EtOH use/ETOH Liver cirrhosis ?-Placed on CIWA protocol, thiamine, folate ?-5/12 discussed with patient need to absolutely discontinue drinking. ? ?Hypokalemia ?-Potassium goal> 4 ?-5/13 see hypophosphatemia ? ?Hypophosphatemia ?- Phosphorus goal> 0.5 ?-5/13 K-Phos 30 mmol ? ?Hypomagnesmia ?- Magnesium goal> 2 ?- 5/14 Magnesium IV 3 g ? ?Noncompliance with medication ?- 5/14 patient continues to refuse medications ?- 5/14 yesterday spent considerable monitored time with patient counseling that failure to comply with treatment regimen could result in his DEATH.  At that time patient stated he understood but would continue to refuse medications. ?-5/14 if patient still uncooperative and refusing to speak in the A.m. will reconsult psychiatry, medication adjustment? ? ? ?Pressure Injury 02/13/22 Buttocks Bilateral (Active)  ?02/13/22 2030  ?Location: Buttocks  ?Location Orientation: Bilateral  ?Staging:   ?Wound Description (Comments):   ?Present on Admission: Yes  ?Dressing Type Foam - Lift dressing to assess site every shift 02/19/22 0830  ? ? ?Interdisciplinary Goals of Care Family Meeting ? ? ?Date carried out: 02/20/2022 ? ?Location of the meeting: Bedside ? ?Member's involved: Dr. Sherral Hammers, patient ? ?Durable Power of Tour manager:    ? ?Discussion: We discussed goals of care for [place] .  Patient now refusing medications to include Solu-Cortef, blood, wound care.  Spoke with patient at length that he had the right to refuse any diagnostic or therapeutic treatment, however in his case he was at high risk of DEATH.  Patient stated he understood, and that he wanted to  be made DNR. ? ?Code status: DNR ? ?Disposition: Continue current acute care ? ?Time spent for the meeting: 15 minutes  ? ? ? ?Standard, [name], MD ? ?02/20/2022, 6:28 PM ?  ? ?  ? ? ?Mobility Assessment (last 72 hours)   ? ? Mobility Assessment   ? ? [place] Name 02/20/22 0725 02/20/22 0715 02/19/22 2025 02/18/22 1041 02/17/22 2201  ? Does patient have an order for bedrest or is patient medically unstable Yes- Bedfast (Level 1) - Complete Yes- Bedfast (Level 1) - Complete Yes- Bedfast (Level 1) - Complete -- No - Continue assessment  ? What is the highest level of mobility based on the progressive mobility assessment? Level 1 (Bedfast) - Unable to balance while sitting on edge of bed Level 1 (Bedfast) - Unable to balance while sitting on edge of bed Level 1 (Bedfast) - Unable to balance while sitting on edge of bed Level 5 (Walks with assist in room/hall) - Balance while stepping forward/back and can walk in room with assist - Complete Level 3 (Stands with assist) - Balance while standing  and cannot march in place  ? Is the above level different from baseline mobility prior to current illness? No - Consider discontinuing PT/OT No - Consider discontinuing PT/OT No - Consider discontinuing PT/OT -- Yes - Recommend PT order  ? ?  ?  ? ?  ? ?Goals of care ?- 5/10 palliative care consult: Multisystem organ failure, alcohol abuse, liver  cirrhosis discuss change of CODE STATUS to DNR.  Appointment of HCPOA ? ?   ?DVT prophylaxis: Lovenox ?Code Status: DNR ?Family Communication: 5/12 brother at bedside for discussion of plan of care all questions answered ?Status is: Inpatient ? ? ? ?Dispo: The patient is from: Home ?             Anticipated d/c is to:  home with HHPT and resumption of aide services  ?             Anticipated d/c date is: > 3 days ?             Patient currently is not medically stable to d/c. ? ? ? ? ? ?Consultants:  ?Palliative care ?Wound care ? ? ?Procedures/Significant Events:  ?5/13 transfuse 1 unit  PRBC ? ? ? ?I have personally reviewed and interpreted all radiology studies and my findings are as above. ? ?VENTILATOR SETTINGS: ?Room air 5/14 ?SPO2 98% ? ? ?Cultures ? ? ?Antimicrobials: ?Anti-infectives

## 2022-02-20 NOTE — Progress Notes (Signed)
Pt continues to refuse medications as well as full assessment/wound care by this RN as well as male RN who also attempted. Pt has been non verbal day and night shift and only nods his head yes or no.  ?

## 2022-02-20 NOTE — TOC Progression Note (Signed)
Transition of Care (TOC) - Progression Note  ? ? ?Patient Details  ?Name: [name] ?MRN: [id number] ?Date of Birth: 09-15-1986 ? ?Transition of Care (TOC) CM/SW Contact  ?Ross Ludwig, LCSW ?Phone Number: ?02/20/2022, 4:34 PM ? ?Clinical Narrative:    ? ?Patient has been faxed out to SNFs, he is a Level 2 [place], [facility] has been assigned to review patient's information. ? ?Expected Discharge Plan: [place] ?Barriers to Discharge: Continued Medical Work up ? ?Expected Discharge Plan and Services ?Expected Discharge Plan: [place] ?In-house Referral: Clinical Social Work ?  ?Post Acute Care Choice: Home Health ?Living arrangements for the past 2 months: Apartment ?                ?  ?  ?  ?  ?  ?  ?  ?  ?  ?  ? ? ?Social Determinants of Health (SDOH) Interventions ?  ? ?Readmission Risk Interventions ? ?  02/15/2022  ?  2:35 PM 08/24/2021  ?  4:00 PM 08/24/2021  ? 11:54 AM  ?Readmission Risk Prevention Plan  ?Transportation Screening Complete Complete   ?PCP or Specialist Appt within 3-5 Days  Complete Complete  ?[place] or Home Care Consult  Complete Complete  ?Social Work Consult for [place] Planning/Counseling   Complete  ?Palliative Care Screening  Complete Complete  ?Medication Review Press photographer) Complete Complete Complete  ?PCP or Specialist appointment within 3-5 days of discharge Complete    ?[place] or Home Care Consult Complete    ?SW Recovery Care/Counseling Consult Complete    ?[place] Not Applicable    ? ? ?

## 2022-02-20 NOTE — Progress Notes (Signed)
Wound care completed this a.m  ?

## 2022-02-21 DIAGNOSIS — K7030 Alcoholic cirrhosis of liver without ascites: Secondary | ICD-10-CM | POA: Diagnosis not present

## 2022-02-21 DIAGNOSIS — F310 Bipolar disorder, current episode hypomanic: Secondary | ICD-10-CM | POA: Diagnosis not present

## 2022-02-21 DIAGNOSIS — G9341 Metabolic encephalopathy: Secondary | ICD-10-CM | POA: Diagnosis not present

## 2022-02-21 DIAGNOSIS — A419 Sepsis, unspecified organism: Secondary | ICD-10-CM | POA: Diagnosis not present

## 2022-02-21 LAB — CBC WITH DIFFERENTIAL/PLATELET
Abs Immature Granulocytes: 0.12 10*3/uL — ABNORMAL HIGH (ref 0.00–0.07)
Basophils Absolute: 0 10*3/uL (ref 0.0–0.1)
Basophils Relative: 0 %
Eosinophils Absolute: 0.1 10*3/uL (ref 0.0–0.5)
Eosinophils Relative: 1 %
HCT: 28 % — ABNORMAL LOW (ref 39.0–52.0)
Hemoglobin: 8.8 g/dL — ABNORMAL LOW (ref 13.0–17.0)
Immature Granulocytes: 1 %
Lymphocytes Relative: 19 %
Lymphs Abs: 2.3 10*3/uL (ref 0.7–4.0)
MCH: 31.7 pg (ref 26.0–34.0)
MCHC: 31.4 g/dL (ref 30.0–36.0)
MCV: 100.7 fL — ABNORMAL HIGH (ref 80.0–100.0)
Monocytes Absolute: 0.9 10*3/uL (ref 0.1–1.0)
Monocytes Relative: 7 %
Neutro Abs: 8.6 10*3/uL — ABNORMAL HIGH (ref 1.7–7.7)
Neutrophils Relative %: 72 %
Platelets: 154 10*3/uL (ref 150–400)
RBC: 2.78 MIL/uL — ABNORMAL LOW (ref 4.22–5.81)
RDW: 17.8 % — ABNORMAL HIGH (ref 11.5–15.5)
WBC: 12 10*3/uL — ABNORMAL HIGH (ref 4.0–10.5)
nRBC: 0 % (ref 0.0–0.2)

## 2022-02-21 LAB — BPAM RBC
Blood Product Expiration Date: 202306132359
ISSUE DATE / TIME: 202305132011
Unit Type and Rh: 5100

## 2022-02-21 LAB — COMPREHENSIVE METABOLIC PANEL
ALT: 12 U/L (ref 0–44)
AST: 12 U/L — ABNORMAL LOW (ref 15–41)
Albumin: 2.2 g/dL — ABNORMAL LOW (ref 3.5–5.0)
Alkaline Phosphatase: 74 U/L (ref 38–126)
Anion gap: 8 (ref 5–15)
BUN: 9 mg/dL (ref 6–20)
CO2: 23 mmol/L (ref 22–32)
Calcium: 7.8 mg/dL — ABNORMAL LOW (ref 8.9–10.3)
Chloride: 109 mmol/L (ref 98–111)
Creatinine, Ser: 0.79 mg/dL (ref 0.61–1.24)
GFR, Estimated: >60 mL/min (ref >60)
Glucose, Bld: 88 mg/dL (ref 70–99)
Potassium: 3.5 mmol/L (ref 3.5–5.1)
Sodium: 140 mmol/L (ref 135–145)
Total Bilirubin: 1.1 mg/dL (ref 0.3–1.2)
Total Protein: 6.5 g/dL (ref 6.5–8.1)

## 2022-02-21 LAB — TYPE AND SCREEN
ABO/RH(D): O POS
Antibody Screen: NEGATIVE
Unit division: 0

## 2022-02-21 LAB — PHOSPHORUS: Phosphorus: 2.7 mg/dL (ref 2.5–4.6)

## 2022-02-21 LAB — MAGNESIUM: Magnesium: 1.9 mg/dL (ref 1.7–2.4)

## 2022-02-21 MED ORDER — LIP MEDEX EX OINT
TOPICAL_OINTMENT | CUTANEOUS | Status: DC | PRN
  Filled 2022-02-21: qty 7

## 2022-02-21 NOTE — Progress Notes (Signed)
SLP Cancellation Note ? ?Patient Details ?Name: [name] ?MRN: [id number] ?DOB: 10-03-1986 ? ? ?Cancelled treatment:       Reason Eval/Treat Not Completed: Patient declined, no reason specified. SLP standing outside of door and asking patient if he wants his lunch, something to drink, anything at all and patient shaking head no each time. SLP thin spoke with patient's NT and she reported that patient has been refusing all PO's and all therapeutic and medical interventions today. She also stated that patient's home health aide is planning to visit this afternoon to try to help encourage patient to participate. SLP will continue efforts.  ? ? ?Sonia Baller, MA, CCC-SLP ?Speech Therapy ? ?

## 2022-02-21 NOTE — Progress Notes (Signed)
Pt refused bed change, CHG bath, pericare. Pt laying in wet bed and gown. Both NT and this RN attempt x2. Pt refused to turn ans shook hi head No. Educated pt on importance of cleaning buttocks and keeping clean d/t PICC. I again asked pt if he cares about getting an infection he continued to shake head no.  ?

## 2022-02-21 NOTE — Progress Notes (Signed)
Pt stated, "do not touch me", when asked (by this RN) if pt would let me do an assessment. Pt stated that, "you have HIV" and "you have HIV from the gloves". Pt was educated of the purpose of assessment, dressing changes, medication compliance, and current plan of care.  ?

## 2022-02-21 NOTE — Progress Notes (Signed)
PT Cancellation Note ? ?Patient Details ?Name: [name] ?MRN: [id number] ?DOB: 1986-07-29 ? ? ?Cancelled Treatment:    Reason Eval/Treat Not Completed: Patient declined, no reason specified. Pt nonverbal and only responds to questions with nodding/shaking their head. Unwilling to move this morning, attempted to convince pt but pt adamant. Will check back later as schedule allows.  ? ? ?Coolidge Breeze, PT, DPT ?[facility] Department ?Office: [phone] ?Pager: [phone] ? ?Coolidge Breeze ?02/21/2022, 10:06 AM ?

## 2022-02-21 NOTE — Progress Notes (Signed)
?PROGRESS NOTE ? ? ? ?[name]  DVV:[id number] DOB: July 18, 1986 DOA: 02/13/2022 ?PCP: Drew Ebbs, MD  ? ? ? ?Brief Narrative:  ?36 year old BM PMHx HIV, bipolar disorder, depression, PTSD, schizoaffective disorder, seizures ? ?Presented to ED via EMS after found by home health aide on the floor.  Patient was unable to provide any history due to altered mental status and somnolence.  Apparently patient had a unwitnessed fall out of bed and his home health aide called the EMS.  Patient was found to be hypotensive by EMS with BP 82/40, hypothermic with temp of 94.4 ?F.  In ED, briefly he was hypotensive with BP in 60s.  Patient was noted to have multiple medication and alcohol bottles spread around at home. 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 ?Patient has unstageable wounds on both of his buttocks and sacrum ? ? ?Subjective: ?5/15 contacted by RN [name] patient continues to refuse medication and dressing change. ?He lives alone.  States has been evaluated for LEFT hip arthroplasty by [name] orthopedic surgery, but no scheduled date.  Sees [name] ID in [place].  Consumes 40 ounces beer daily, negative other alcohol or drugs. ? ? ?Assessment & Plan: ?Covid vaccination; ?  ?Principal Problem: ?  Sepsis ([place]) ?Active Problems: ?  Protein-calorie malnutrition, severe ([place]) ?  Schizoaffective disorder, depressive type ([place]) ?  HIV disease ([place]) ?  Prolonged QT interval ?  Acute metabolic encephalopathy ?  Alcoholic cirrhosis of liver without ascites ([place]) ?  Hyperphosphatemia ?  Alcoholic cirrhosis of liver ([place]) ?  Chronic alcohol use ?  Bipolar disorder ([place]) ?  Noncompliance with medications ? 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 intake, hypoalbuminemia, source possibly due to buttocks wound ?-CT chest abdomen pelvis did not show any acute chest or abdominal pathology. ?-UA showed trace leukocytes, positive nitrates, follow urine culture.  Blood cultures negative so far ?-Random cortisol level inconclusive as patient had already received high-dose hydrocortisone 100 mg IV x1.  Taper hydrocortisone to 25q12hrs, off tomorrow ?-BP improving, continue midodrine 10 mg 3 times daily ?--Albumin less than 1.5, received IV albumin 25g IV q6hrs x3 doses ?-Narrow antibiotics to IV Rocephin and doxycycline ?-Wound care following ?-5/11 Albumin 50 g x 1: Will help with patient's BP. ?  ?Acute metabolic encephalopathy ?-Acute due to #1, much more alert and oriented today ?-CT head negative for any intracranial abnormality ?-Continue doxycycline, IV Rocephin ?-Continue lactulose ?-Start PT, increase mobilization ?-5/10 resolved ?  ?HIV ?-Continue Biktarvy ? ?Hypotension ?-5/9 continue Solu-Cortef IV 25 mg ?-5/11 Albumin 50 g x 1: Will help with patient's BP ?-5/13 refusing Solu-Cortef ? ?QT prolongation ?- 5/10 EKG; sinus arrhythmia, QTc prolonged 511 ?-5/11 monitor closely, limit QTc prolonging medication ?  ?Severe protein calorie malnutrition ?-Albumin less than 1.5, appears to be cachectic ?-Received IV albumin x3 doses ?-Nutrition consult, encourage p.o. diet ?  ?History of bipolar disorder, schizoaffective disorder ?-Resume Klonopin due to previous history of catatonia  ?-Discussed with psychiatry on 5/8, resumed psych meds (recommended trazodone, Latuda, Haldol, Klonopin).   ?  ?  ?Normocytic Anemia  ?-Hemoglobin down to 7.7 today, no bleeding, likely hemodilution ?-DC IV fluids, encourage p.o. diet ?Lab Results  ?Component Value Date  ? HGB 8.8 (L) 02/21/2022  ? HGB 8.1 (L) 02/20/2022  ?  HGB 7.0 (L) 02/19/2022  ? HGB 7.3 (L) 02/18/2022  ? HGB 8.0 (L) 02/17/2022  ?-5/12 occult blood negative ?- Transfuse  for hemoglobin<7 ?-5/13 transfuse 1 unit PRBC ? ?Thrombocytosis ?- Reactive vs HIT ?- 5/12 DC all heparin containing products ?- 5/12 change to SCD for VTE prophylaxis ?-5/12 HIT antibody negative ?  ?Chronic EtOH use/ETOH Liver cirrhosis ?-Placed on CIWA protocol, thiamine, folate ?-5/12 discussed with patient need to absolutely discontinue drinking. ? ?Hypokalemia ?-Potassium goal> 4 ?-5/13 see hypophosphatemia ? ?Hypophosphatemia ?- Phosphorus goal> 0.5 ?-5/13 K-Phos 30 mmol ? ?Hypomagnesmia ?- Magnesium goal> 2 ?- 5/14 Magnesium IV 3 g ? ?Noncompliance with medication ?- 5/14 patient continues to refuse medications ?- 5/14 yesterday spent considerable monitored time with patient counseling that failure to comply with treatment regimen could result in his DEATH.  At that time patient stated he understood but would continue to refuse medications. ?-5/14 if patient still uncooperative and refusing to speak in the A.m. will reconsult psychiatry, medication adjustment? ? ? ?Pressure Injury 02/13/22 Buttocks Bilateral (Active)  ?02/13/22 2030  ?Location: Buttocks  ?Location Orientation: Bilateral  ?Staging:   ?Wound Description (Comments):   ?Present on Admission: Yes  ?Dressing Type Foam - Lift dressing to assess site every shift 02/19/22 0830  ? ? ?Interdisciplinary Goals of Care Family Meeting ? ? ?Date carried out: 02/21/2022 ? ?Location of the meeting: Bedside ? ?Member's involved: Dr. Sherral Hammers, patient ? ?Durable Power of Tour manager:    ? ?Discussion: We discussed goals of care for [name] .  Patient now refusing medications to include Solu-Cortef, blood, wound care.  Spoke with patient at length that he had the right to refuse any diagnostic or therapeutic treatment, however in his case he was at high risk of DEATH.  Patient stated he understood, and that he wanted to be made DNR. ? ?Code status: DNR ? ?Disposition: Continue current acute care ? ?Time spent for the meeting:  15 minutes  ? ? ? ?Weidemann, [name], MD ? ?02/21/2022, 10:36 AM ?  ? ?  ? ? ?Mobility Assessment (last 72 hours)   ? ? Mobility Assessment   ? ? [place] Name 02/20/22 2000 02/20/22 0725 02/20/22 0715 02/19/22 2025 02/18/22 1041  ? Does patient have an order for bedrest or is patient medically unstable Yes- Bedfast (Level 1) - Complete Yes- Bedfast (Level 1) - Complete Yes- Bedfast (Level 1) - Complete Yes- Bedfast (Level 1) - Complete --  ? What is the highest level of mobility based on the progressive mobility assessment? Level 1 (Bedfast) - Unable to balance while sitting on edge of bed Level 1 (Bedfast) - Unable to balance while sitting on edge of bed Level 1 (Bedfast) - Unable to balance while sitting on edge of bed Level 1 (Bedfast) - Unable to balance while sitting on edge of bed Level 5 (Walks with assist in room/hall) - Balance while stepping forward/back and can walk in room with assist - Complete  ? Is the above level different from baseline mobility prior to current illness? No - Consider discontinuing PT/OT No - Consider discontinuing PT/OT No - Consider discontinuing PT/OT No - Consider discontinuing PT/OT --  ? ?  ?  ? ?  ? ?Goals of care ?- 5/10 palliative care consult: Multisystem organ failure, alcohol abuse, liver cirrhosis discuss change of CODE STATUS to DNR.  Appointment of HCPOA ? ?   ?DVT prophylaxis: Lovenox ?Code Status: DNR ?Family Communication: 5/12 brother at  bedside for discussion of plan of care all questions answered ?Status is: Inpatient ? ? ? ?Dispo: The patient is from: Home ?             Anticipated d/c is to:  home with HHPT and resumption of aide services  ?             Anticipated d/c date is: > 3 days ?             Patient currently is not medically stable to d/c. ? ? ? ? ? ?Consultants:  ?Palliative care ?Wound care ? ? ?Procedures/Significant Events:  ?5/13 transfuse 1 unit PRBC ? ? ? ?I have personally reviewed and interpreted all radiology studies and my findings are as  above. ? ?VENTILATOR SETTINGS: ?Room air 5/14 ?SPO2 98% ? ? ?Cultures ? ? ?Antimicrobials: ?Anti-infectives (From admission, onward)  ? ? Start     Ordered Stop  ? 02/15/22 2200  doxycycline (VIBRA-TABS) tablet 100 m

## 2022-02-21 NOTE — Progress Notes (Signed)
Pt continues to be non-compliant. Refuses all medication this shift after multiple attempts. Explained to pt importance of meds and reiterated Dr. Sherral Hammers statements per notes that he is at high risk for death. I asked him if he still refuses he nods his head in yes. I asked  him," are you giving up? He nods his head yes. I asked,"you don't want to try and feel better, the medicine will help, and help you feel better?" he nods his head No. ? ?Pt continued to pull and unplug tele monitor had him on stand bye off and on throughout the shift. ?

## 2022-02-21 NOTE — Progress Notes (Signed)
Attempted to call pt mother back. No answer at this time. ?

## 2022-02-21 NOTE — Progress Notes (Signed)
SLP Cancellation Note ? ?Patient Details ?Name: [name] ?MRN: [id number] ?DOB: Feb 21, 1986 ? ? ?Cancelled treatment:       Reason Eval/Treat Not Completed: Patient declined, no reason specified. SLP returned in PM (approximately 250pm) and patient again refused by shaking head "no". SLP informed patient that a different SLP will check on him tomorrow and he nodded head. SLP will attempt next date. ? ? ?Sonia Baller, MA, CCC-SLP ?Speech Therapy ? ?

## 2022-02-21 NOTE — Care Management Important Message (Signed)
Important Message ? ?Patient Details IM Letter given to the Patient. ?Name: [name] ?MRN: [id number] ?Date of Birth: April 02, 1986 ? ? ?Medicare Important Message Given:  Yes ? ? ? ? ?Kerin Salen ?02/21/2022, 12:06 PM ?

## 2022-02-21 NOTE — Progress Notes (Signed)
Physical Therapy Treatment ?Patient Details ?Name: [name] ?MRN: [id number] ?DOB: 1985/11/29 ?Today's Date: 02/21/2022 ? ? ?History of Present Illness 36 year old male with HIV, bipolar disorder, depression, PTSD, schizoaffective disorder, seizures presented to ED via EMS after found by home health aide on the floor. Dx of severe sepsis with shock, buttock wounds. ? ?  ?PT Comments  ? ? Pt agrees to be seen in combination with RN and NT for pericare and bandage changes. Pt required min assist for bed mobility, min assist for sit to stand transfer, and mod assist +2 for step pivot transfer bed to chair secondary to weakness (see transfer section below). Pt unable to remain standing with BUE on RW for entirety of pericare / bandange changes, required 60s seated rest break; also unable to come to full upright standing. Pt generally refusing treatment and mobility with various staff members and is not progressing, typically refuses to be seen or asks PT to return tomorrow; attempted to educate pt about importance of movement and increasing debility but pt refuses to listen. Discharge plan remains appropriate, we will continue to follow him acutely. ?   ?Recommendations for follow up therapy are one component of a multi-disciplinary discharge planning process, led by the attending physician.  Recommendations may be updated based on patient status, additional functional criteria and insurance authorization. ? ?Follow Up Recommendations ? Skilled nursing-short term rehab (<3 hours/day) ?  ?  ?Assistance Recommended at Discharge Frequent or constant Supervision/Assistance  ?Patient can return home with the following Help with stairs or ramp for entrance;Assistance with cooking/housework;Assist for transportation;A lot of help with bathing/dressing/bathroom;A lot of help with walking and/or transfers;Direct supervision/assist for medications management;Direct supervision/assist for financial management ?  ?Equipment  Recommendations ? Other (comment) (TBD at SNF)  ?  ?Recommendations for Other Services   ? ? ?  ?Precautions / Restrictions Precautions ?Precautions: Fall;Other (comment) ?Precaution Comments: monitor HR, pt denies falls in past 6 months (was found on floor by HHA just prior to this admission) ?Restrictions ?Weight Bearing Restrictions: No  ?  ? ?Mobility ? Bed Mobility ?Overal bed mobility: Needs Assistance ?Bed Mobility: Supine to Sit ?  ?  ?Supine to sit: Min assist ?  ?  ?General bed mobility comments: pt min assist for bringing LLE off bed, otherwise min guard ?  ? ?Transfers ?Overall transfer level: Needs assistance ?Equipment used: Rolling walker (2 wheels) ?Transfers: Sit to/from Stand, Bed to chair/wheelchair/BSC ?Sit to Stand: Min assist, From elevated surface ?  ?Step pivot transfers: Mod assist, From elevated surface, +2 safety/equipment ?  ?  ?  ?General transfer comment: Pt min assist for sit to stand transfer for lift assist, steadying while in static stance as pt swaying right and left. Unable to stand for entirity of pericare and changing of sacral bandages, took seated rest break. for step pivot transfer pt required mod assist for management of AD, assistance with moving BLEs, max multimodal cuing for sequencing as pt attempting to pivot prior arriving at safe location in front of recliner, required mod assist from PT to scoot BLEs in line and shift pelvis to appropriate rotation, pt slow to respond to cuing to perform actions himself but does complete them with assistance. ?  ? ?Ambulation/Gait ?  ?  ?  ?  ?  ?  ?  ?General Gait Details: Deferred, pt not safe to ambulate based on transfer level and general affective mood. ? ? ?Stairs ?  ?  ?  ?  ?  ? ? ?  Wheelchair Mobility ?  ? ?Modified Rankin (Stroke Patients Only) ?  ? ? ?  ?Balance Overall balance assessment: Needs assistance ?Sitting-balance support: Feet supported, Bilateral upper extremity supported ?Sitting balance-Leahy Scale: Poor ?  ?   ?Standing balance support: Reliant on assistive device for balance, During functional activity, Bilateral upper extremity supported ?Standing balance-Leahy Scale: Poor ?Standing balance comment: Pt unable to come to full upright standing, maintains high degree of cervical and thoracic flexion despite cuing. ?  ?  ?  ?  ?  ?  ?  ?  ?  ?  ?  ?  ? ?  ?Cognition Arousal/Alertness: Awake/alert ?Behavior During Therapy: Flat affect ?Overall Cognitive Status: No family/caregiver present to determine baseline cognitive functioning ?  ?  ?  ?  ?  ?  ?  ?  ?  ?  ?  ?  ?  ?  ?  ?  ?General Comments: Pt not verbal, just nodding/shaking his head to questions, appears to not be in a good mood. ?  ?  ? ?  ?Exercises   ? ?  ?General Comments   ?  ?  ? ?Pertinent Vitals/Pain Pain Assessment ?Pain Assessment: Faces ?Faces Pain Scale: Hurts a little bit ?Pain Location: L hip ?Pain Descriptors / Indicators: Sore ?Pain Intervention(s): Limited activity within patient's tolerance, Monitored during session, Repositioned  ? ? ?Home Living   ?  ?  ?  ?  ?  ?  ?  ?  ?  ?   ?  ?Prior Function    ?  ?  ?   ? ?PT Goals (current goals can now be found in the care plan section) Acute Rehab PT Goals ?Patient Stated Goal: return home ?PT Goal Formulation: With patient ?Time For Goal Achievement: 03/02/22 ?Potential to Achieve Goals: Fair ?Progress towards PT goals: Not progressing toward goals - comment (Pt generally refusing care, not mobilizing with staff.) ? ?  ?Frequency ? ? ? Min 2X/week ? ? ? ?  ?PT Plan Current plan remains appropriate  ? ? ?Co-evaluation   ?  ?  ?  ?  ? ?  ?AM-PAC PT "6 Clicks" Mobility   ?Outcome Measure ? Help needed turning from your back to your side while in a flat bed without using bedrails?: A Little ?Help needed moving from lying on your back to sitting on the side of a flat bed without using bedrails?: A Little ?Help needed moving to and from a bed to a chair (including a wheelchair)?: A Lot ?Help needed standing  up from a chair using your arms (e.g., wheelchair or bedside chair)?: A Lot ?Help needed to walk in hospital room?: A Lot ?Help needed climbing 3-5 steps with a railing? : A Lot ?6 Click Score: 14 ? ?  ?End of Session Equipment Utilized During Treatment: Gait belt ?Activity Tolerance: No increased pain;Patient limited by fatigue ?Patient left: in chair;with call bell/phone within reach;with nursing/sitter in room;with chair alarm set ?Nurse Communication: Mobility status ?PT Visit Diagnosis: Difficulty in walking, not elsewhere classified (R26.2);Pain ?Pain - Right/Left: Left ?Pain - part of body: Hip ?  ? ? ?Time: 0623-7628 ?PT Time Calculation (min) (ACUTE ONLY): 20 min ? ?Charges:  $Therapeutic Activity: 8-22 mins          ?          ? ?Coolidge Breeze, PT, DPT ?[facility] ?Office: [phone] ?Pager: [phone] ? ?Coolidge Breeze ?02/21/2022, 5:19 PM ? ?

## 2022-02-22 DIAGNOSIS — F310 Bipolar disorder, current episode hypomanic: Secondary | ICD-10-CM | POA: Diagnosis not present

## 2022-02-22 DIAGNOSIS — B20 Human immunodeficiency virus [HIV] disease: Secondary | ICD-10-CM | POA: Diagnosis not present

## 2022-02-22 DIAGNOSIS — G9341 Metabolic encephalopathy: Secondary | ICD-10-CM | POA: Diagnosis not present

## 2022-02-22 DIAGNOSIS — Z91148 Patient's other noncompliance with medication regimen for other reason: Secondary | ICD-10-CM

## 2022-02-22 DIAGNOSIS — Z515 Encounter for palliative care: Secondary | ICD-10-CM | POA: Diagnosis not present

## 2022-02-22 DIAGNOSIS — K7030 Alcoholic cirrhosis of liver without ascites: Secondary | ICD-10-CM | POA: Diagnosis not present

## 2022-02-22 DIAGNOSIS — A419 Sepsis, unspecified organism: Secondary | ICD-10-CM | POA: Diagnosis not present

## 2022-02-22 LAB — COMPREHENSIVE METABOLIC PANEL
ALT: 11 U/L (ref 0–44)
AST: 14 U/L — ABNORMAL LOW (ref 15–41)
Albumin: 2.2 g/dL — ABNORMAL LOW (ref 3.5–5.0)
Alkaline Phosphatase: 71 U/L (ref 38–126)
Anion gap: 8 (ref 5–15)
BUN: 8 mg/dL (ref 6–20)
CO2: 25 mmol/L (ref 22–32)
Calcium: 7.9 mg/dL — ABNORMAL LOW (ref 8.9–10.3)
Chloride: 108 mmol/L (ref 98–111)
Creatinine, Ser: 0.8 mg/dL (ref 0.61–1.24)
GFR, Estimated: >60 mL/min (ref >60)
Glucose, Bld: 67 mg/dL — ABNORMAL LOW (ref 70–99)
Potassium: 3.5 mmol/L (ref 3.5–5.1)
Sodium: 141 mmol/L (ref 135–145)
Total Bilirubin: 1.3 mg/dL — ABNORMAL HIGH (ref 0.3–1.2)
Total Protein: 6.5 g/dL (ref 6.5–8.1)

## 2022-02-22 LAB — CBC WITH DIFFERENTIAL/PLATELET
Abs Immature Granulocytes: 0.07 10*3/uL (ref 0.00–0.07)
Basophils Absolute: 0 10*3/uL (ref 0.0–0.1)
Basophils Relative: 0 %
Eosinophils Absolute: 0.2 10*3/uL (ref 0.0–0.5)
Eosinophils Relative: 2 %
HCT: 27 % — ABNORMAL LOW (ref 39.0–52.0)
Hemoglobin: 8.4 g/dL — ABNORMAL LOW (ref 13.0–17.0)
Immature Granulocytes: 1 %
Lymphocytes Relative: 23 %
Lymphs Abs: 2.5 10*3/uL (ref 0.7–4.0)
MCH: 31.7 pg (ref 26.0–34.0)
MCHC: 31.1 g/dL (ref 30.0–36.0)
MCV: 101.9 fL — ABNORMAL HIGH (ref 80.0–100.0)
Monocytes Absolute: 0.9 10*3/uL (ref 0.1–1.0)
Monocytes Relative: 9 %
Neutro Abs: 7 10*3/uL (ref 1.7–7.7)
Neutrophils Relative %: 65 %
Platelets: 150 10*3/uL (ref 150–400)
RBC: 2.65 MIL/uL — ABNORMAL LOW (ref 4.22–5.81)
RDW: 18 % — ABNORMAL HIGH (ref 11.5–15.5)
WBC: 10.8 10*3/uL — ABNORMAL HIGH (ref 4.0–10.5)
nRBC: 0 % (ref 0.0–0.2)

## 2022-02-22 LAB — PHOSPHORUS: Phosphorus: 3 mg/dL (ref 2.5–4.6)

## 2022-02-22 LAB — MAGNESIUM: Magnesium: 1.9 mg/dL (ref 1.7–2.4)

## 2022-02-22 NOTE — Progress Notes (Signed)
?PROGRESS NOTE ? ? ? ?[name]  EHU:[id number] DOB: [birth year] DOA: 02/13/2022 ?PCP: Drew Ebbs, MD  ? ? ? ?Brief Narrative:  ?36 year old BM PMHx HIV, bipolar disorder, depression, PTSD, schizoaffective disorder, seizures ? ?Presented to ED via EMS after found by home health aide on the floor.  Patient was unable to provide any history due to altered mental status and somnolence.  Apparently patient had a unwitnessed fall out of bed and his home health aide called the EMS.  Patient was found to be hypotensive by EMS with BP 82/40, hypothermic with temp of 94.4 ?F.  In ED, briefly he was hypotensive with BP in 60s.  Patient was noted to have multiple medication and alcohol bottles spread around at home. 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 ?Patient has unstageable wounds on both of his buttocks and sacrum ? ? ?Subjective: ?5/16 afebrile overnight answers some questions today.  Much more interactive.  Sitting in chair comfortably.  Follows all commands today.He lives alone. ? ? ?Assessment & Plan: ?Covid vaccination; ?  ?Principal Problem: ?  Sepsis ([place]) ?Active Problems: ?  Protein-calorie malnutrition, severe ([place]) ?  Schizoaffective disorder, depressive type ([place]) ?  HIV disease ([place]) ?  Prolonged QT interval ?  Acute metabolic encephalopathy ?  Alcoholic cirrhosis of liver without ascites ([place]) ?  Hyperphosphatemia ?  Alcoholic cirrhosis of liver ([place]) ?  Chronic alcohol use ?  Bipolar disorder ([place]) ?  Noncompliance with medications ? 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 intake, hypoalbuminemia, source possibly due to buttocks wound ?-CT chest abdomen pelvis did not show any acute chest or abdominal  pathology. ?-UA showed trace leukocytes, positive nitrates, follow urine culture.  Blood cultures negative so far ?-Random cortisol level inconclusive as patient had already received high-dose hydrocortisone 100 mg IV x1.  Taper hydrocortisone to 25q12hrs, off tomorrow ?-BP improving, continue midodrine 10 mg 3 times daily ?--Albumin less than 1.5, received IV albumin 25g IV q6hrs x3 doses ?-Narrow antibiotics to IV Rocephin and doxycycline ?-Wound care following ?-5/11 Albumin 50 g x 1: Will help with patient's BP. ?  ?Acute metabolic encephalopathy ?-Acute due to #1, much more alert and oriented today ?-CT head negative for any intracranial abnormality ?-Continue doxycycline, IV Rocephin ?-Continue lactulose ?-Start PT, increase mobilization ?-5/10 resolved ?  ?HIV ?-Continue Biktarvy ? ?Hypotension ?-5/9 continue Solu-Cortef IV 25 mg BID ?-5/11 Albumin 50 g x 1: Will help with patient's BP ?-5/13 refusing Solu-Cortef ?-5/16 DC Solu-Cortef, patient's BP was stable on days he refused medication ? ?QT prolongation ?- 5/10 EKG; sinus arrhythmia, QTc prolonged 511 ?-5/11 monitor closely, limit QTc prolonging medication ?  ?Severe protein calorie malnutrition ?-Albumin less than 1.5, appears to be cachectic ?-Received IV albumin x3 doses ?-Nutrition consult, encourage p.o. diet ?  ?History of bipolar disorder, schizoaffective disorder ?-Resume Klonopin due to previous history of catatonia  ?-Discussed with psychiatry on 5/8, resumed psych meds (recommended trazodone, Latuda, Haldol, Klonopin).   ?  ?  ?Normocytic Anemia  ?-Hemoglobin down to 7.7 today, no bleeding, likely hemodilution ?-DC IV fluids, encourage p.o. diet ?Lab Results  ?Component Value Date  ? HGB 8.4 (L) 02/22/2022  ? HGB 8.8 (L) 02/21/2022  ? HGB 8.1 (L) 02/20/2022  ? HGB 7.0 (L) 02/19/2022  ? HGB 7.3 (L)  02/18/2022  ?-5/12 occult blood negative ?- Transfuse for hemoglobin<7 ?-5/13 transfuse 1 unit PRBC ? ?Thrombocytosis ?- Reactive vs HIT ?- 5/12 DC  all heparin containing products ?- 5/12 change to SCD for VTE prophylaxis ?-5/12 HIT antibody negative ?  ?Chronic EtOH use/ETOH Liver cirrhosis ?-Placed on CIWA protocol, thiamine, folate ?-5/12 discussed with patient need to absolutely discontinue drinking. ? ?Hypokalemia ?-Potassium goal> 4 ?-5/13 see hypophosphatemia ? ?Hypophosphatemia ?- Phosphorus goal> 0.5 ?-5/13 K-Phos 30 mmol ? ?Hypomagnesmia ?- Magnesium goal> 2 ?- 5/14 Magnesium IV 3 g ? ?Noncompliance with medication ?- 5/14 patient continues to refuse medications ?- 5/14 yesterday spent considerable monitored time with patient counseling that failure to comply with treatment regimen could result in his DEATH.  At that time patient stated he understood but would continue to refuse medications. ?-5/14 if patient still uncooperative and refusing to speak in the A.m. will reconsult psychiatry, medication adjustment? ? ? ?Pressure Injury 02/13/22 Buttocks Bilateral (Active)  ?02/13/22 2030  ?Location: Buttocks  ?Location Orientation: Bilateral  ?Staging:   ?Wound Description (Comments):   ?Present on Admission: Yes  ?Dressing Type Foam - Lift dressing to assess site every shift 02/21/22 1955  ? ? ?Interdisciplinary Goals of Care Family Meeting ? ? ?Date carried out: 02/22/2022 ? ?Location of the meeting: Bedside ? ?Member's involved: Dr. Sherral [name], patient ? ?Durable Power of Tour manager:    ? ?Discussion: We discussed goals of care for [place] .  Patient now refusing medications to include Solu-Cortef, blood, wound care.  Spoke with patient at length that he had the right to refuse any diagnostic or therapeutic treatment, however in his case he was at high risk of DEATH.  Patient stated he understood, and that he wanted to be made DNR. ? ?Code status: DNR ? ?Disposition: Continue current acute care ? ?Time spent for the meeting: 15 minutes  ? ? ? ?[name], Drew Docker, MD ? ?02/22/2022, 3:38 PM ?  ? ?  ? ? ?Mobility Assessment  (last 72 hours)   ? ? Mobility Assessment   ? ? [place] Name 02/21/22 1955 02/21/22 1712 02/20/22 2000 02/20/22 0725 02/20/22 0715  ? Does patient have an order for bedrest or is patient medically unstable Yes- Bedfast (Level 1) - Complete -- Yes- Bedfast (Level 1) - Complete Yes- Bedfast (Level 1) - Complete Yes- Bedfast (Level 1) - Complete  ? What is the highest level of mobility based on the progressive mobility assessment? Level 3 (Stands with assist) - Balance while standing  and cannot march in place Level 3 (Stands with assist) - Balance while standing  and cannot march in place Level 1 (Bedfast) - Unable to balance while sitting on edge of bed Level 1 (Bedfast) - Unable to balance while sitting on edge of bed Level 1 (Bedfast) - Unable to balance while sitting on edge of bed  ? Is the above level different from baseline mobility prior to current illness? No - Consider discontinuing PT/OT -- No - Consider discontinuing PT/OT No - Consider discontinuing PT/OT No - Consider discontinuing PT/OT  ? ? [place] Name 02/19/22 2025  ?  ?  ?  ?  ? Does patient have an order for bedrest or is patient medically unstable Yes- Bedfast (Level 1) - Complete      ? What is the highest level of mobility based on the progressive mobility assessment? Level 1 (Bedfast) - Unable to balance while sitting on edge of bed      ?  Is the above level different from baseline mobility prior to current illness? No - Consider discontinuing PT/OT      ? ?  ?  ? ?  ? ?Goals of care ?- 5/10 palliative care consult: Multisystem organ failure, alcohol abuse, liver cirrhosis discuss change of CODE STATUS to DNR.  Appointment of HCPOA ?-5/16 TOC still trying to find SNF who will accept patient ? ?   ?DVT prophylaxis: Lovenox ?Code Status: DNR ?Family Communication: 5/12 brother at bedside for discussion of plan of care all questions answered ?Status is: Inpatient ? ? ? ?Dispo: The patient is from: Home ?             Anticipated d/c is to:  home with HHPT  and resumption of aide services  ?             Anticipated d/c date is: > 3 days ?             Patient currently is not medically stable to d/c. ? ? ? ? ? ?Consultants:  ?Palliative care ?Wound care ?

## 2022-02-22 NOTE — Progress Notes (Signed)
Patient continue to be non-verbal when asked questions. He is laying his head against the call light on the bed rail and pressing it throughout the night. He is not asking for anything and will point at the door. He will sometimes nod his head yes or no with questions. ?

## 2022-02-22 NOTE — Progress Notes (Signed)
? ?                                                                                                                                                     ?                                                   ?Daily Progress Note  ? ?Patient Name: [name]       Date: 02/22/2022 ?DOB: 1986/02/16  Age: 36 y.o. MRN#: [id number] ?Attending Physician: Allie Bossier, MD ?Primary Care Physician: [name], MD ?Admit Date: 02/13/2022 ? ?Reason for Consultation/Follow-up: Establishing goals of care ? ?Subjective: ?Awake and alert sitting in bed.  Shakes my hand on entering room. ? ?States, "no thank you" when asked if he needs anything but does not verbalize responses to other questions.  Shakes his head "no" when asked about symptoms including pain or shortness of breath. ? ?Length of Stay: 9 ? ?Current Medications: ?Scheduled Meds:  ? bictegravir-emtricitabine-tenofovir AF  1 tablet Oral Daily  ? chlorhexidine  15 mL Mouth Rinse BID  ? Chlorhexidine Gluconate Cloth  6 each Topical Daily  ? clonazePAM  0.5 mg Oral BID  ? diclofenac Sodium  4 g Topical QID  ? famotidine  40 mg Oral Daily  ? FLUoxetine  40 mg Oral Daily  ? folic acid  1 mg Oral Daily  ? haloperidol  5 mg Oral Daily  ? hydrocortisone sod succinate (SOLU-CORTEF) inj  25 mg Intravenous Q12H  ? lactulose  20 g Oral BID  ? linaclotide  145 mcg Oral QAC breakfast  ? lurasidone  120 mg Oral Daily  ? mouth rinse  15 mL Mouth Rinse q12n4p  ? mouth rinse  15 mL Mouth Rinse q12n4p  ? midodrine  10 mg Oral TID WC  ? multivitamin with minerals  1 tablet Oral Daily  ? nicotine  21 mg Transdermal Daily  ? pantoprazole  40 mg Oral Daily  ? sodium chloride flush  10-40 mL Intracatheter Q12H  ? thiamine  100 mg Oral Daily  ? Or  ? thiamine  100 mg Intravenous Daily  ? valACYclovir  1,000 mg Oral Daily  ? ? ?Continuous Infusions: ? sodium chloride 250 mL (02/20/22 0509)  ? ? ?PRN Meds: ?acetaminophen, albuterol, diphenoxylate-atropine, lip balm, sodium chloride flush,  trazodone ? ?Physical Exam         ?Awake alert ?Doesn't verbalize much ?Has a blank stare ?Follows commands ?No distress ?No edema ?Regular work of breathing ? ?Vital Signs: BP 111/83 (BP Location: Left Arm)   Pulse 90   Temp 97.8 ?F (36.6 ?C) (Oral)   Resp 11  Ht 6' 3"  (1.905 m)   Wt 86 kg   SpO2 98%   BMI 23.70 kg/m?  ?SpO2: SpO2: 98 % ?O2 Device: O2 Device: Room Air ?O2 Flow Rate: O2 Flow Rate (L/min): 3 L/min ? ?Intake/output summary:  ?Intake/Output Summary (Last 24 hours) at 02/22/2022 1637 ?Last data filed at 02/22/2022 1248 ?Gross per 24 hour  ?Intake 140 ml  ?Output 300 ml  ?Net -160 ml  ? ? ?LBM: Last BM Date : 02/18/22 ?Baseline Weight: Weight: 86 kg ?Most recent weight: Weight: 86 kg ? ?     ?Palliative Assessment/Data: ? ? ? ? ? ?Patient Active Problem List  ? Diagnosis Date Noted  ? Suicidal ideation 01/12/2014  ? Protein-calorie malnutrition, severe ([place]) 10/09/2021  ? Noncompliance with medications 02/20/2022  ? Hyperphosphatemia 02/17/2022  ? Alcoholic cirrhosis of liver ([place]) 02/17/2022  ? Chronic alcohol use 02/17/2022  ? Bipolar disorder ([place]) 02/17/2022  ? Sepsis ([place]) 02/13/2022  ? Malnutrition of moderate degree 10/20/2021  ? Palliative care by specialist   ? DNR (do not resuscitate) 10/14/2021  ? Aspiration pneumonia ([place]) 10/14/2021  ? FTT (failure to thrive) in adult 10/14/2021  ? Dysphagia 10/14/2021  ? Coagulopathy ([place]) 10/14/2021  ? Avascular necrosis of femoral head, left ([place]) 10/07/2021  ? Alcoholic cirrhosis of liver without ascites ([place]) 10/07/2021  ? Cellulitis of multiple sites of buttock 10/07/2021  ? Hypotension   ? Hypoxia 08/24/2021  ? Left hip pain   ? Transaminitis   ? Acute metabolic encephalopathy 87/56/4332  ? Cellulitis 05/07/2021  ? Lymphadenopathy 09/29/2020  ? Anemia   ? Upper urinary tract infection   ? Sepsis secondary to UTI ([place]) 03/14/2020  ? Cellulitis of groin 03/14/2020  ? HIV disease ([place])   ? Macrocytic anemia   ? Thrombocytopenia ([place])   ? Prolonged  QT interval   ? Acute respiratory failure due to COVID-19 [facility]) 10/27/2019  ? GERD (gastroesophageal reflux disease) 10/27/2019  ? Hypokalemia 10/27/2019  ? Peripheral neuropathy 10/01/2019  ? PTSD (post-traumatic stress disorder) 07/23/2018  ? Dizziness and giddiness 02/01/2016  ? Migraine headache 02/01/2016  ? Schizoaffective disorder, depressive type ([place]) 05/13/2015  ? Severe alcohol dependence ([place]) 05/13/2015  ? ? ?Palliative Care Assessment & Plan  ? ?Patient Profile: ?  ? ?Assessment: ? 36 year old BM PMHx HIV, bipolar disorder, depression, PTSD, schizoaffective disorder, seizures.  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 ?Recommendations/Plan: ?Agree with DNR ?We will continue to follow intermittently this hospitalization, but no other palliative specific recommendations at this point. ?Recommend SNF rehab with palliative.  ?  ?Code Status: ? ?  ?Code Status Orders  ?(From admission, onward)  ?  ? ? ?  ? ?  Start     Ordered  ? 02/19/22 1609  Do not attempt resuscitation (DNR)  Continuous       ?Question Answer Comment  ?In the event of cardiac or respiratory ARREST Do not call a ?code blue?   ?In the event of cardiac or respiratory ARREST Do not perform Intubation, CPR, defibrillation or ACLS   ?In the event of cardiac or respiratory ARREST Use medication by any route, position, wound care, and other measures to relive pain and suffering. May use oxygen, suction and manual treatment of airway obstruction as needed for comfort.   ?  ? 02/19/22 1608  ? ?  ?  ? ?  ? ?Code Status History   ? ? Date  Active Date Inactive Code Status Order ID Comments User Context  ? 02/13/2022 2142 02/19/2022 1608 Full Code [id number]  [name], MD Inpatient  ? 10/14/2021 1207 11/04/2021 1705 DNR [id number]  [name], NP Inpatient  ? 10/07/2021 0228 10/14/2021 1206 Full Code [id number]  [name], MD  ED  ? 08/24/2021 0200 09/01/2021 0258 Full Code [id number]  [name], MD ED  ? 05/07/2021 1912 05/19/2021 1841 Full Code [id number]  [name], MD ED  ? 03/15/2020 0606 03/20/2020 1650 Full Code [id number]  [name], MD ED  ? 10/27/2019 1815 11/02/2019 1902 Full Code [id number]  [name], MD Inpatient  ? 07/22/2018 1722 07/22/2018 2158 Full Code [id number]  [name], NP Inpatient  ? 07/22/2018 0832 07/22/2018 1703 Full Code [id number]  [name], MD ED  ? 05/12/2015 1753 05/15/2015 1723 Full Code [id number]  [name], NP Inpatient  ? 05/12/2015 0546 05/12/2015 1753 Full Code [id number]  [name], MD ED  ? 01/12/2014 0256 01/12/2014 1650 Full Code [id number]  [name], MD ED  ? ?  ? ? ?Prognosis: ? Unable to determine ? ?Discharge Planning: ?[facility] for rehab with Palliative care service follow-up ? ?Care plan was discussed with  patient ? ?Thank you for allowing the Palliative Medicine Team to assist in the care of this patient. ? ? ?Time In: 1130 Time Out: 1200 Total Time 30 Prolonged Time Billed  no   ? ?   ?Greater than 50%  of this time was spent counseling and coordinating care related to the above assessment and plan. ? ?Micheline Rough, MD ? ?Please contact Palliative Medicine Team phone at [phone] for questions and concerns.  ? ? ? ? ? ?

## 2022-02-23 DIAGNOSIS — F310 Bipolar disorder, current episode hypomanic: Secondary | ICD-10-CM | POA: Diagnosis not present

## 2022-02-23 DIAGNOSIS — G9341 Metabolic encephalopathy: Secondary | ICD-10-CM | POA: Diagnosis not present

## 2022-02-23 DIAGNOSIS — A419 Sepsis, unspecified organism: Secondary | ICD-10-CM | POA: Diagnosis not present

## 2022-02-23 DIAGNOSIS — K7030 Alcoholic cirrhosis of liver without ascites: Secondary | ICD-10-CM | POA: Diagnosis not present

## 2022-02-23 LAB — CBC WITH DIFFERENTIAL/PLATELET
Abs Immature Granulocytes: 0.07 10*3/uL (ref 0.00–0.07)
Basophils Absolute: 0 10*3/uL (ref 0.0–0.1)
Basophils Relative: 0 %
Eosinophils Absolute: 0 10*3/uL (ref 0.0–0.5)
Eosinophils Relative: 0 %
HCT: 29.4 % — ABNORMAL LOW (ref 39.0–52.0)
Hemoglobin: 9.1 g/dL — ABNORMAL LOW (ref 13.0–17.0)
Immature Granulocytes: 1 %
Lymphocytes Relative: 11 %
Lymphs Abs: 1.1 10*3/uL (ref 0.7–4.0)
MCH: 31.3 pg (ref 26.0–34.0)
MCHC: 31 g/dL (ref 30.0–36.0)
MCV: 101 fL — ABNORMAL HIGH (ref 80.0–100.0)
Monocytes Absolute: 0.3 10*3/uL (ref 0.1–1.0)
Monocytes Relative: 3 %
Neutro Abs: 9 10*3/uL — ABNORMAL HIGH (ref 1.7–7.7)
Neutrophils Relative %: 85 %
Platelets: 195 10*3/uL (ref 150–400)
RBC: 2.91 MIL/uL — ABNORMAL LOW (ref 4.22–5.81)
RDW: 18.1 % — ABNORMAL HIGH (ref 11.5–15.5)
WBC: 10.5 10*3/uL (ref 4.0–10.5)
nRBC: 0 % (ref 0.0–0.2)

## 2022-02-23 LAB — PHOSPHORUS: Phosphorus: 3 mg/dL (ref 2.5–4.6)

## 2022-02-23 LAB — COMPREHENSIVE METABOLIC PANEL
ALT: 13 U/L (ref 0–44)
AST: 17 U/L (ref 15–41)
Albumin: 2.3 g/dL — ABNORMAL LOW (ref 3.5–5.0)
Alkaline Phosphatase: 86 U/L (ref 38–126)
Anion gap: 9 (ref 5–15)
BUN: 10 mg/dL (ref 6–20)
CO2: 22 mmol/L (ref 22–32)
Calcium: 8.2 mg/dL — ABNORMAL LOW (ref 8.9–10.3)
Chloride: 109 mmol/L (ref 98–111)
Creatinine, Ser: 0.9 mg/dL (ref 0.61–1.24)
GFR, Estimated: >60 mL/min (ref >60)
Glucose, Bld: 197 mg/dL — ABNORMAL HIGH (ref 70–99)
Potassium: 3.3 mmol/L — ABNORMAL LOW (ref 3.5–5.1)
Sodium: 140 mmol/L (ref 135–145)
Total Bilirubin: 0.8 mg/dL (ref 0.3–1.2)
Total Protein: 6.9 g/dL (ref 6.5–8.1)

## 2022-02-23 LAB — VITAMIN B12: Vitamin B-12: 738 pg/mL (ref 180–914)

## 2022-02-23 LAB — MAGNESIUM: Magnesium: 1.8 mg/dL (ref 1.7–2.4)

## 2022-02-23 MED ORDER — MIDODRINE HCL 5 MG PO TABS
5.0000 mg | ORAL_TABLET | Freq: Three times a day (TID) | ORAL | Status: DC
  Administered 2022-02-23 – 2022-03-04 (×22): 5 mg via ORAL
  Filled 2022-02-23 (×31): qty 1

## 2022-02-23 MED ORDER — ENSURE ENLIVE PO LIQD
237.0000 mL | Freq: Two times a day (BID) | ORAL | Status: DC
  Administered 2022-02-23 – 2022-03-04 (×6): 237 mL via ORAL

## 2022-02-23 MED ORDER — POTASSIUM CHLORIDE CRYS ER 20 MEQ PO TBCR
40.0000 meq | EXTENDED_RELEASE_TABLET | Freq: Two times a day (BID) | ORAL | Status: AC
  Administered 2022-02-23: 40 meq via ORAL
  Filled 2022-02-23: qty 2

## 2022-02-23 MED ORDER — MAGNESIUM SULFATE 2 GM/50ML IV SOLN
2.0000 g | Freq: Once | INTRAVENOUS | Status: AC
  Administered 2022-02-23: 2 g via INTRAVENOUS
  Filled 2022-02-23: qty 50

## 2022-02-23 NOTE — Progress Notes (Addendum)
[facility] ?PROGRESS NOTE ? ? ? ?Progress Note  ?[name]  HWT:[id number] DOB: 11-Nov-1985 DOA: 02/13/2022 ?PCP: [name], MD  ? ? ? ?Brief Narrative:  ? ?[name] is an 36 y.o. male past medical history of HIV, bipolar disorder depression PTSD and schizoaffective disorder along with seizures comes into the EMS as he was found by home health on the floor found to be altered and somnolent unable to provide history.  He had an apparent fall unwitnessed from the bed, his home health aide called EMS patient was found briefly hypotensive and hypothermic in the ED.  Patient was found to have multiple medications and alcohol sprout throughout the room.  CT scan of the abdomen pelvis showed fractures.  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 ? ?Awaiting placement to skilled nursing facility. ? ?Assessment/Plan:  ? ?Severe Sepsis ([place]) possibly due to bilateral buttock wounds and hidradenitis suppurativa: ?Culture data has been negative so far. ?Wean off steroids. ?He did receive several doses of IV albumin. ?Patient completed course of IV Rocephin and doxycycline on 02/20/2022. ?Has remained afebrile since then, leukocytosis resolved. ?Wound care was consulted recommended to wash posterior upper thigh with soap pat dry keep the derma therapy line against the skin. ?Vitals are currently stable. ?Blood cultures are negative till date, urine culture showed multiple species. ? ? ?Acute metabolic encephalopathy: ?Likely due to infectious etiology.  Now resolved ?CT of the head showed no acute intracranial abnormalities. ?Currently on IV Rocephin and doxycycline. ?Continue lactulose. ?Physical therapy evaluated the patient recommended rehab. ? ?HIV: ?Continue current regimen. ? ?Hypotension: ?We will started on stress dose, has now been weaned off. ?Continue to titrate down midodrine. ? ?Prolonged  QTc: ?Try to keep potassium greater than 4 magnesium greater than 2. ? ?Severe protein caloric malnutrition: ?Counseling.  Continue Ensure 3 times daily. ? ?History of bipolar disorder/schizoaffective disorder: ?Continue Klonopin, trazodone, Latuda and Haldol as needed. ? ?Normocytic anemia: ?With a hemoglobin of 7.7, MCV of 100 check a B12 and a folate level. ?He is status post 1 unit of packed red blood cells on 02/19/2022. ? ?Thrombocytosis: ?Likely reactive HIT panel negative. ? ?Chronic alcohol abuse/liver cirrhosis alcoholic: ?No signs of withdrawal continue lactulose 3 times daily. ? ?Hypokalemia: ?Replete orally try to keep greater than 4. ? ?Hypophosphatemia: ?Try to keep phosphorus greater than 3. ? ?Hypomagnesemia ?Magnesium is 1.8. ? ?Noncompliance with medication. ?Patient continues to refuse his medication the previous doctor had a long conversation with the patient, and counseling about the failure to comply with medication could result in death at that time the patient relates he understood. ?On 02/20/2022 the patient is still on comparative refuses to speak this morning. ? ? ?Unstageable pressure ulcer present on buttock on admission ?RN Pressure Injury Documentation: ?Pressure Injury 02/13/22 Buttocks Bilateral (Active)  ?02/13/22 2030  ?Location: Buttocks  ?Location Orientation: Bilateral  ?Staging:   ?Wound Description (Comments):   ?Present on Admission: Yes  ?Dressing Type Foam - Lift dressing to assess site every shift 02/21/22 1955  ? ? ? ?DVT prophylaxis: lovenox ?Family Communication:none ?Status is: Inpatient ?Remains inpatient appropriate because: Severe sepsis ? ? ? ?Code Status:  ? ?  ?Code Status Orders  ?(From admission, onward)  ?  ? ? ?  ? ?  Start     Ordered  ? 02/19/22 1609  Do not attempt resuscitation (DNR)  Continuous       ?  Question Answer Comment  ?In the event of cardiac or respiratory ARREST Do not call a ?code blue?   ?In the event of cardiac or respiratory ARREST Do not  perform Intubation, CPR, defibrillation or ACLS   ?In the event of cardiac or respiratory ARREST Use medication by any route, position, wound care, and other measures to relive pain and suffering. May use oxygen, suction and manual treatment of airway obstruction as needed for comfort.   ?  ? 02/19/22 1608  ? ?  ?  ? ?  ? ?Code Status History   ? ? Date Active Date Inactive Code Status Order ID Comments User Context  ? 02/13/2022 2142 02/19/2022 1608 Full Code [id number]  [name], MD Inpatient  ? 10/14/2021 1207 11/04/2021 1705 DNR [id number]  [name], NP Inpatient  ? 10/07/2021 0228 10/14/2021 1206 Full Code [id number]  [name], MD ED  ? 08/24/2021 0200 09/01/2021 0258 Full Code [id number]  [name], MD ED  ? 05/07/2021 1912 05/19/2021 1841 Full Code [id number]  [name], MD ED  ? 03/15/2020 0606 03/20/2020 1650 Full Code [id number]  [name], MD ED  ? 10/27/2019 1815 11/02/2019 1902 Full Code [id number]  [name], MD Inpatient  ? 07/22/2018 1722 07/22/2018 2158 Full Code [id number]  [name], NP Inpatient  ? 07/22/2018 0832 07/22/2018 1703 Full Code [id number]  [name], MD ED  ? 05/12/2015 1753 05/15/2015 1723 Full Code [id number]  [name], NP Inpatient  ? 05/12/2015 0546 05/12/2015 1753 Full Code [id number]  [name], MD ED  ? 01/12/2014 0256 01/12/2014 1650 Full Code [id number]  [name], MD ED  ? ?  ? ? ? ? ?IV Access:  ? ?Peripheral IV ? ? ?Procedures and diagnostic studies:  ? ?No results found. ? ? ?Medical Consultants:  ? ?None. ? ? ?Subjective:  ? ? ?[name] no complaints feels better. ? ?Objective:  ? ? ?Vitals:  ? 02/22/22 0621 02/22/22 0916 02/22/22 2007 02/23/22 0559  ?BP: 109/87 111/83 126/83 111/90  ?Pulse: (!) 110 90 73 64  ?Resp: 18 11 15 18   ?Temp: 97.6 ?F (36.4 ?C) 97.8 ?F (36.6 ?C) 98 ?F (36.7 ?C) (!) 97.5 ?F (36.4 ?C)  ?TempSrc: Oral Oral Oral Oral  ?SpO2: 99% 98% 98% 92%  ?Weight:      ?Height:      ? ?SpO2: 92 % ?O2  Flow Rate (L/min): 3 L/min ? ? ?Intake/Output Summary (Last 24 hours) at 02/23/2022 1053 ?Last data filed at 02/23/2022 0930 ?Gross per 24 hour  ?Intake 540 ml  ?Output 1250 ml  ?Net -710 ml  ? ?Filed Weights  ? 02/13/22 1521  ?Weight: 86 kg  ? ? ?Exam: ?General exam: In no acute distress. ?Respiratory system: Good air movement and clear to auscultation. ?Cardiovascular system: S1 & S2 heard, RRR. No JVD. ?Gastrointestinal system: Abdomen is nondistended, soft and nontender.  ?Extremities: No pedal edema. ?Skin: No rashes, lesions or ulcers ?Psychiatry: Judgement and insight appear normal. Mood & affect appropriate.  ? ? ?Data Reviewed:  ? ? ?Labs: ?Basic Metabolic Panel: ?Recent Labs  ?Lab 02/19/22 ?0507 02/20/22 ?0406 02/21/22 ?7782 02/22/22 ?0425 02/23/22 ?0315  ?NA 139 143 140 141 140  ?K 3.3* 3.8 3.5 3.5 3.3*  ?CL 108 111 109 108 109  ?CO2 26 26 23 25 22   ?GLUCOSE 99 91 88 67* 197*  ?BUN 11 10 9 8 10   ?CREATININE 0.89 0.76 0.79 0.80 0.90  ?CALCIUM  7.8* 8.0* 7.8* 7.9* 8.2*  ?MG 1.7 1.6* 1.9 1.9 1.8  ?PHOS 2.0* 3.2 2.7 3.0 3.0  ? ?GFR ?Estimated Creatinine Clearance: 133 mL/min (by C-G formula based on SCr of 0.9 mg/dL). ?Liver Function Tests: ?Recent Labs  ?Lab 02/19/22 ?0507 02/20/22 ?0406 02/21/22 ?9450 02/22/22 ?0425 02/23/22 ?0315  ?AST 11* 14* 12* 14* 17  ?ALT 12 13 12 11 13   ?ALKPHOS 74 77 74 71 86  ?BILITOT 0.5 0.9 1.1 1.3* 0.8  ?PROT 6.2* 6.4* 6.5 6.5 6.9  ?ALBUMIN 2.2* 2.3* 2.2* 2.2* 2.3*  ? ?No results for input(s): LIPASE, AMYLASE in the last 168 hours. ?No results for input(s): AMMONIA in the last 168 hours. ?Coagulation profile ?No results for input(s): INR, PROTIME in the last 168 hours. ?COVID-19 Labs ? ?No results for input(s): DDIMER, FERRITIN, LDH, CRP in the last 72 hours. ? ?Lab Results  ?Component Value Date  ? [place] NEGATIVE 02/13/2022  ? [place] NEGATIVE 10/07/2021  ? [place] NEGATIVE 08/23/2021  ? [place] NEGATIVE 05/07/2021  ? ? ?CBC: ?Recent Labs  ?Lab 02/19/22 ?0507  02/20/22 ?0406 02/21/22 ?3888 02/22/22 ?0425 02/23/22 ?0315  ?WBC 10.2 10.9* 12.0* 10.8* 10.5  ?NEUTROABS 6.3 7.3 8.6* 7.0 9.0*  ?HGB 7.0* 8.1* 8.8* 8.4* 9.1*  ?HCT 22.6* 25.4* 28.0* 27.0* 29.4*  ?MCV 10

## 2022-02-23 NOTE — Progress Notes (Signed)
Patient refused all medications, nonverbal when asked to take his medications. Patient would only shake his head. Nurse explained importance of being compliant with his medications. Patient continued to refuse after being educated.  Will continue to monitor patient  ?

## 2022-02-23 NOTE — Progress Notes (Signed)
Speech Language Pathology Treatment: Dysphagia  ?Patient Details ?Name: [name] ?MRN: [id number] ?DOB: 1986-05-31 ?Today's Date: 02/23/2022 ?Time: [phone] ?SLP Time Calculation (min) (ACUTE ONLY): 19 min ? ?Assessment / Plan / Recommendation ?Clinical Impression ? Pt upright in chair, SLP observed pt with am meal. Pt needed verbal cue to pick up spoon/fork to take each bite, was otherwise distractible and not initiating self feeding. Once he had a reminder, he did well, with only mild oral residue that cleared with a liquid wash. No coughing or signs of aspiration. Given chronic nature of dysphagia, this is the diet that pt will need to continue in order to prevent aspiration, but also hard distressing coughing. He appears most comfortable with honey thick liquids and reports he also likes his food precut. SLP will sign off at this time as pt is tolerating diet but there is not an expectation of improvement in function.   ?HPI HPI: Patient is a 36 y.o. male who is well known to SLP secondary to frequent admissions and chronic dysphagia. He has PMH: HIV, bipolar disorder, PTSD, schizoaffective disorder, seizures. He presented to [facility] ED via EMS after being found on the floor at home by home health aide. Apparently, patient had an unwittnessed fall out of bed. When EMS arrived, they noted that patient had multiple medication and alcohol bottles spread around at home.  In ED, patient was briefly hypotensive with BP in 60's, CT chest/abdomen/pelvis showed no acute findings. Patient admitted with severe sepsis and was hypotensive, hypothermic, possible hypovolemia with poor PO intake; UA showed  trace leukocytes, positive nitrates. Patient with unstageable wounds on both of his buttocks and sacrum. ?  ?   ?SLP Plan ? All goals met ? ?  ?  ?Recommendations for follow up therapy are one component of a multi-disciplinary discharge planning process, led by the attending physician.  Recommendations may be updated based  on patient status, additional functional criteria and insurance authorization. ?  ? ?Recommendations  ?Diet recommendations: Dysphagia 3 (mechanical soft);Honey-thick liquid ?Liquids provided via: Cup ?Medication Administration: Whole meds with puree ?Supervision: Staff to assist with self feeding;Full supervision/cueing for compensatory strategies ?Compensations: Slow rate;Small sips/bites;Minimize environmental distractions ?Postural Changes and/or Swallow Maneuvers: Seated upright 90 degrees;Upright 30-60 min after meal  ?   ?    ?   ? ? ? ? Plan: All goals met ? ? ? ? ?  ?  ? ? ?Abryanna Musolino, Katherene Ponto ? ?02/23/2022, 8:59 AM ?

## 2022-02-23 NOTE — TOC Progression Note (Addendum)
Transition of Care (TOC) - Progression Note  ? ? ?Patient Details  ?Name: KIPPY GOHMAN ?MRN: [id number] ?Date of Birth: 09-19-1986 ? ?Transition of Care (TOC) CM/SW Contact  ?Banner Elk, LCSW ?Phone Number: ?02/23/2022, 10:14 AM ? ?Clinical Narrative:    ? ?Met with pt and provided SNF bed offers and medicare ratings. CSW will follow up in afternoon for choice.  ? ?1600: Pt Chooses [facility] ? ?Expected Discharge Plan: [place] ?Barriers to Discharge: Ship broker, Programmer, applications (PASRR) ? ?Expected Discharge Plan and Services ?Expected Discharge Plan: [place] ?In-house Referral: Clinical Social Work ?  ?Post Acute Care Choice: Home Health ?Living arrangements for the past 2 months: Apartment ?                ?  ?  ?  ?  ?  ?  ?  ?  ?  ?  ? ? ?Social Determinants of Health (SDOH) Interventions ?  ? ?Readmission Risk Interventions ? ?  02/15/2022  ?  2:35 PM 08/24/2021  ?  4:00 PM 08/24/2021  ? 11:54 AM  ?Readmission Risk Prevention Plan  ?Transportation Screening Complete Complete   ?PCP or Specialist Appt within 3-5 Days  Complete Complete  ?[place] or Home Care Consult  Complete Complete  ?Social Work Consult for [place] Planning/Counseling   Complete  ?Palliative Care Screening  Complete Complete  ?Medication Review Press photographer) Complete Complete Complete  ?PCP or Specialist appointment within 3-5 days of discharge Complete    ?[place] or Home Care Consult Complete    ?SW Recovery Care/Counseling Consult Complete    ?[place] Not Applicable    ? ? ?

## 2022-02-23 NOTE — Progress Notes (Signed)
Initial Nutrition Assessment ? ?INTERVENTION:  ? ?-Ensure Plus High Protein po BID, each supplement provides 350 kcal and 20 grams of protein.  ? ?NUTRITION DIAGNOSIS:  ? ?Increased nutrient needs related to wound healing, chronic illness as evidenced by estimated needs. ? ?GOAL:  ? ?Patient will meet greater than or equal to 90% of their needs ? ?MONITOR:  ? ?PO intake, Supplement acceptance, Labs, Weight trends, I & O's ? ?REASON FOR ASSESSMENT:  ? ?Malnutrition Screening Tool ?  ? ?ASSESSMENT:  ? ?36 y.o. male past medical history of HIV, bipolar disorder depression PTSD and schizoaffective disorder along with seizures comes into the EMS as he was found by home health on the floor found to be altered and somnolent unable to provide history. Admitted for severe sepsis and acute metabolic encephalopathy. ? ?Patient not providing history during visit. ?Currently consuming 50-75% of meals at this time. Pt recommended to continue a dysphagia 3 diet with honey thick liquids per SLP note today.  ?Will order Ensure supplements for additional kcals and protein.  ? ?Per weight records, pt has lost 51 lbs since 08/28/21 (21% wt loss x 6 months, significant for time frame).  ? ?Medications: Folic acid, Lactulose, Multivitamin with minerals daily, KLOR-CON, Thiamine, IV Mg sulfate  ? ?Labs reviewed: ? Low K ? ?NUTRITION - FOCUSED PHYSICAL EXAM: ? ?Pt deferred ? ?Diet Order:   ?Diet Order   ? ?       ?  DIET DYS 3 Room service appropriate? Yes; Fluid consistency: Honey Thick  Diet effective now       ?  ? ?  ?  ? ?  ? ? ?EDUCATION NEEDS:  ? ?No education needs have been identified at this time ? ?Skin:  Skin Assessment: Skin Integrity Issues: ?Skin Integrity Issues:: Unstageable ?Unstageable: bilateral buttocks ? ?Last BM:  5/12 ? ?Height:  ? ?Ht Readings from Last 1 Encounters:  ?02/13/22 6' 3"  (1.905 m)  ? ? ?Weight:  ? ?Wt Readings from Last 1 Encounters:  ?02/13/22 86 kg  ? ? ?BMI:  Body mass index is 23.7  kg/m?. ? ?Estimated Nutritional Needs:  ? ?Kcal:  2300-2500 ? ?Protein:  105-115g ? ?Fluid:  2.3L/day ? ?[name] Bibles, MS, RD, LDN ?Inpatient Clinical Dietitian ?Contact information available via Amion ? ?

## 2022-02-24 DIAGNOSIS — A419 Sepsis, unspecified organism: Secondary | ICD-10-CM | POA: Diagnosis not present

## 2022-02-24 DIAGNOSIS — G9341 Metabolic encephalopathy: Secondary | ICD-10-CM | POA: Diagnosis not present

## 2022-02-24 LAB — BASIC METABOLIC PANEL
Anion gap: 5 (ref 5–15)
BUN: 10 mg/dL (ref 6–20)
CO2: 26 mmol/L (ref 22–32)
Calcium: 8.4 mg/dL — ABNORMAL LOW (ref 8.9–10.3)
Chloride: 114 mmol/L — ABNORMAL HIGH (ref 98–111)
Creatinine, Ser: 0.8 mg/dL (ref 0.61–1.24)
GFR, Estimated: >60 mL/min (ref >60)
Glucose, Bld: 117 mg/dL — ABNORMAL HIGH (ref 70–99)
Potassium: 3.2 mmol/L — ABNORMAL LOW (ref 3.5–5.1)
Sodium: 145 mmol/L (ref 135–145)

## 2022-02-24 LAB — MAGNESIUM: Magnesium: 2.1 mg/dL (ref 1.7–2.4)

## 2022-02-24 LAB — FOLATE RBC
Folate, Hemolysate: 348 ng/mL
Folate, RBC: 1168 ng/mL (ref >498)
Hematocrit: 29.8 % — ABNORMAL LOW (ref 37.5–51.0)

## 2022-02-24 LAB — PHOSPHORUS: Phosphorus: 3.7 mg/dL (ref 2.5–4.6)

## 2022-02-24 MED ORDER — POTASSIUM CHLORIDE CRYS ER 20 MEQ PO TBCR
40.0000 meq | EXTENDED_RELEASE_TABLET | Freq: Two times a day (BID) | ORAL | Status: AC
  Filled 2022-02-24: qty 2

## 2022-02-24 NOTE — Progress Notes (Signed)
    OVERNIGHT PROGRESS REPORT  Notified by RN for patient fall out of bed onto knees. Bed was in low position and nurse was immediately responsive to hearing him.  Patient has no stated complaints and no obvious injury. Patient is not on blood thinner.  He denies falling completely to the floor.    Gershon Cull MSNA MSN ACNPC-AG Acute Care Nurse Practitioner [place]

## 2022-02-24 NOTE — Evaluation (Addendum)
Clinical/Bedside Swallow Evaluation Patient Details  Name: [name] MRN: [id number] Date of Birth: [birth year]  Today's Date: 02/24/2022 Time: SLP Start Time (ACUTE ONLY): 1220 SLP Stop Time (ACUTE ONLY): 1240 SLP Time Calculation (min) (ACUTE ONLY): 20 min  Past Medical History:  Past Medical History:  Diagnosis Date   Bipolar 1 disorder ([place])    Depression    Dizziness and giddiness 02/01/2016   Herpes genitalia    HIV disease ([place])    Hypertension    Hyponatremia    Hypothermia 08/24/2021   Migraine headache 02/01/2016   Peripheral neuropathy 10/01/2019   PTSD (post-traumatic stress disorder)    Schizoaffective disorder ([place])    Seizures ([place])    Past Surgical History:  Past Surgical History:  Procedure Laterality Date   BACK SURGERY     BIOPSY  02/26/2021   Procedure: BIOPSY;  Surgeon: Otis Brace, MD;  Location: [facility];  Service: Gastroenterology;;   COLONOSCOPY WITH PROPOFOL [name] 02/26/2021   Procedure: COLONOSCOPY WITH PROPOFOL;  Surgeon: Otis Brace, MD;  Location: [facility];  Service: Gastroenterology;  Laterality: [name];   ESOPHAGOGASTRODUODENOSCOPY (EGD) WITH PROPOFOL [name] 02/26/2021   Procedure: ESOPHAGOGASTRODUODENOSCOPY (EGD) WITH PROPOFOL;  Surgeon: Otis Brace, MD;  Location: [facility];  Service: Gastroenterology;  Laterality: [name];   ESOPHAGOGASTRODUODENOSCOPY (EGD) WITH PROPOFOL [name] 05/18/2021   Procedure: ESOPHAGOGASTRODUODENOSCOPY (EGD) WITH PROPOFOL;  Surgeon: Otis Brace, MD;  Location: [facility];  Service: Gastroenterology;  Laterality: [name];   HAND SURGERY     HPI:  Patient is a 36 y.o. male who is well known to SLP secondary to frequent admissions and chronic dysphagia. He has PMH: HIV, bipolar disorder, PTSD, schizoaffective disorder, seizures. He presented to [facility] ED via EMS after being found on the floor at home by home health aide. Apparently, patient had an unwittnessed fall out of bed. When EMS arrived, they noted  that patient had multiple medication and alcohol bottles spread around at home.  In ED, patient was briefly hypotensive with BP in 60's, CT chest/abdomen/pelvis showed no acute findings. Patient admitted with severe sepsis and was hypotensive, hypothermic, possible hypovolemia with poor PO intake; UA showed  trace leukocytes, positive nitrates. Patient with unstageable wounds on both of his buttocks and sacrum. Pt seen by SLP with MBS (total of 5 now) recommending puree and honey thick liquids due to severe pharyngeal dysphagia. Pt able to upgrade to mechanical soft and on 5/17 SLP signed off as pt was self feeding and tolerating diet as well as expected given chronicity and severity of dysphagia. On 5/18 SLP was reordered to further cognitive decline and decreased ability to take PO in the setting of many days of refusal/inability to take medication.    Assessment / Plan / Recommendation  Clinical Impression  Pt demosntrates significant cognitive decline today, He is writhing and rocking in bed with lingual thrusting behavior. He attempts to verbally respond, but only mouths words and does not phonate. RN reports he has refused medication over the past week but now seems unable to swallow and is drooling. Pt demosntrates decreased initaition of swallowing of saliva, but is capable of swallowing. When given teaspoons of honey thick liquids pt responds with pursed lips and sipping behavior but does swallow almost immediately with no coughing. SLP also able to give pt small pieces of a banana inserted into his cheek, which also triggers mastication. This change is obviously a cognitive/psychiatric complication of inconsistent medication. Unfortunately now it is even harder to administer meds even if pt is  willing. Offered some strategies to RN such as crushing meds in a small amount of honey thick liquid and spoon feeding pt, or trying meds crushed in ice cream as sweet, cold foods are more stimulating. Overall,  pt will be at recurrent risk of poor PO intake and inconsistent ability to take meds.  He has a high risk of aspiration given baseline severe pharyngeal dysphagia but now has a complicating oral component. Will follow chart for needs and visit pt but have little expectation that SLP interventions are going to help pt. SLP Visit Diagnosis: Dysphagia, oral phase (R13.11)    Aspiration Risk  Severe aspiration risk;Risk for inadequate nutrition/hydration    Diet Recommendation   Dys 3/honey when alert - may only be able to take teaspoons of honey at this time. Will leave diet order in place given waxing and waning mentation  Medication Administration: Crushed with puree Supervision: Full supervision/cueing for compensatory strategies Compensations: Slow rate;Small sips/bites;Minimize environmental distractions Postural Changes: Seated upright at 90 degrees    Other  Recommendations      Recommendations for follow up therapy are one component of a multi-disciplinary discharge planning process, led by the attending physician.  Recommendations may be updated based on patient status, additional functional criteria and insurance authorization.  Follow up Recommendations No SLP follow up      Assistance Recommended at Discharge Frequent or constant Supervision/Assistance  Functional Status Assessment Patient has had a recent decline in their functional status and/or demonstrates limited ability to make significant improvements in function in a reasonable and predictable amount of time  Frequency and Duration min 1 x/week  1 week       Prognosis Prognosis for Safe Diet Advancement: Guarded Barriers to Reach Goals: Severity of deficits;Cognitive deficits;Behavior;Medication      Swallow Study   General HPI: Patient is a 36 y.o. male who is well known to SLP secondary to frequent admissions and chronic dysphagia. He has PMH: HIV, bipolar disorder, PTSD, schizoaffective disorder, seizures. He  presented to [facility] ED via EMS after being found on the floor at home by home health aide. Apparently, patient had an unwittnessed fall out of bed. When EMS arrived, they noted that patient had multiple medication and alcohol bottles spread around at home.  In ED, patient was briefly hypotensive with BP in 60's, CT chest/abdomen/pelvis showed no acute findings. Patient admitted with severe sepsis and was hypotensive, hypothermic, possible hypovolemia with poor PO intake; UA showed  trace leukocytes, positive nitrates. Patient with unstageable wounds on both of his buttocks and sacrum. Pt seen by SLP with MBS (total of 5 now) recommending puree and honey thick liquids due to severe pharyngeal dysphagia. Pt able to upgrade to mechanical soft and on 5/17 SLP signed off as pt was self feeding and tolerating diet as well as expected given chronicity and severity of dysphagia. On 5/18 SLP was reordered to further cognitive decline and decreased ability to take PO in the setting of many days of refusal/inability to take medication. Type of Study: Bedside Swallow Evaluation Previous Swallow Assessment: see HPI Diet Prior to this Study: Dysphagia 1 (puree);Honey-thick liquids Temperature Spikes Noted: No Respiratory Status: Room air History of Recent Intubation: No Behavior/Cognition: Distractible;Requires cueing Oral Cavity Assessment: Within Functional Limits Oral Care Completed by SLP: No Oral Cavity - Dentition: Adequate natural dentition Vision: Impaired for self-feeding Self-Feeding Abilities: Total assist Patient Positioning: Partially reclined Baseline Vocal Quality: Other (comment) (does not phonate) Volitional Cough: Cognitively unable to elicit  Oral/Motor/Sensory Function Overall Oral Motor/Sensory Function: Other (comment) (lingual rocking and thrusting)   Ice Chips Ice chips: Impaired Oral Phase Functional Implications: Other (comment) (swallowing without masticating)   Thin Liquid Thin  Liquid: Not tested    Nectar Thick Nectar Thick Liquid: Not tested   Honey Thick Honey Thick Liquid: Impaired Presentation: Spoon   Puree Puree: Not tested   Solid     Solid: Impaired Oral Phase Impairments: Poor awareness of bolus      Uchechukwu Dhawan, Katherene Ponto 02/24/2022,1:00 PM

## 2022-02-24 NOTE — Progress Notes (Signed)
[facility] PROGRESS NOTE    Progress Note  [name]  QMG:[id number] DOB: 08-24-1986 DOA: 02/13/2022 PCP: [name], MD     Brief Narrative:   [name] is an 36 y.o. male past medical history of HIV, bipolar disorder depression PTSD and schizoaffective disorder along with seizures comes into the EMS as he was found by home health on the floor found to be altered and somnolent unable to provide history.  He had an apparent fall unwitnessed from the bed, his home health aide called EMS patient was found briefly hypotensive and hypothermic in the ED.  Patient was found to have multiple medications and alcohol sprout throughout the room.  CT scan of the abdomen pelvis showed fractures.  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  Awaiting placement to skilled nursing facility.  Assessment/Plan:   Severe Sepsis ([place]) possibly due to bilateral buttock wounds and hidradenitis suppurativa: Wean off steroids. Patient completed course of IV Rocephin and doxycycline on 02/20/2022. Wound care was consulted recommended to wash posterior upper thigh with soap pat dry keep the derma therapy line against the skin. Vitals are currently stable. Blood cultures are negative till date, urine culture showed multiple species. Physical therapy evaluated the patient recommended skilled nursing facility awaiting placement.  Acute metabolic encephalopathy: Likely due to infectious etiology.  Now resolved CT of the head showed no acute intracranial abnormalities. Continue lactulose. Physical therapy evaluated the patient recommended rehab.  HIV: Continue current regimen.  Hypotension: We will started on stress dose, has now been weaned off. Continue to titrate down midodrine.  Prolonged QTc: Try to keep potassium greater than 4 magnesium greater than 2.  Severe protein  caloric malnutrition: Counseling.  Continue Ensure 3 times daily.  History of bipolar disorder/schizoaffective disorder: Continue Klonopin, trazodone, Latuda and Haldol as needed.  Normocytic anemia: With a hemoglobin of 7.7, MCV of 100 check a B12 and a folate level. He is status post 1 unit of packed red blood cells on 02/19/2022.  Thrombocytosis: Likely reactive HIT panel negative.  Chronic alcohol abuse/liver cirrhosis alcoholic: No signs of withdrawal continue lactulose 3 times daily.  Hypokalemia: Replete orally try to keep greater than 4.  Hypophosphatemia: Try to keep phosphorus greater than 3.  Hypomagnesemia Magnesium is 1.8.  Noncompliance with medication. Patient continues to refuse his medication the previous doctor had a long conversation with the patient, and counseling about the failure to comply with medication could result in death at that time the patient relates he understood. On 02/20/2022 the patient is still on comparative refuses to speak this morning.   Unstageable pressure ulcer present on buttock on admission RN Pressure Injury Documentation: Pressure Injury 02/13/22 Buttocks Bilateral (Active)  02/13/22 2030  Location: Buttocks  Location Orientation: Bilateral  Staging:   Wound Description (Comments):   Present on Admission: Yes  Dressing Type Foam - Lift dressing to assess site every shift 02/21/22 1955     DVT prophylaxis: lovenox Family Communication:none Status is: Inpatient Remains inpatient appropriate because: Severe sepsis    Code Status:     Code Status Orders  (From admission, onward)           Start     Ordered   02/19/22 1609  Do not attempt resuscitation (DNR)  Continuous       Question Answer Comment  In the event of cardiac or respiratory ARREST Do  not call a "code blue"   In the event of cardiac or respiratory ARREST Do not perform Intubation, CPR, defibrillation or ACLS   In the event of cardiac or respiratory  ARREST Use medication by any route, position, wound care, and other measures to relive pain and suffering. May use oxygen, suction and manual treatment of airway obstruction as needed for comfort.      02/19/22 1608           Code Status History     Date Active Date Inactive Code Status Order ID Comments User Context   02/13/2022 2142 02/19/2022 1608 Full Code [id number]  [name], MD Inpatient   10/14/2021 1207 11/04/2021 1705 DNR [id number]  [name], NP Inpatient   10/07/2021 0228 10/14/2021 1206 Full Code [id number]  [name], MD ED   08/24/2021 0200 09/01/2021 0258 Full Code [id number]  [name], MD ED   05/07/2021 1912 05/19/2021 1841 Full Code [id number]  [name], MD ED   03/15/2020 0606 03/20/2020 1650 Full Code [id number]  [name], MD ED   10/27/2019 1815 11/02/2019 1902 Full Code [id number]  [name], MD Inpatient   07/22/2018 1722 07/22/2018 2158 Full Code [id number]  [name], NP Inpatient   07/22/2018 0832 07/22/2018 1703 Full Code [id number]  [name], MD ED   05/12/2015 1753 05/15/2015 1723 Full Code [id number]  [name], NP Inpatient   05/12/2015 0546 05/12/2015 1753 Full Code [id number]  [name], MD ED   01/12/2014 0256 01/12/2014 1650 Full Code [id number]  [name], MD ED         IV Access:   Peripheral IV   Procedures and diagnostic studies:   No results found.   Medical Consultants:   None.   Subjective:    [name] no complaints  Objective:    Vitals:   02/23/22 0559 02/23/22 1419 02/23/22 1956 02/24/22 0700  BP: 111/90 121/86 (!) 120/97 119/83  Pulse: 64 (!) 105 89 (!) 101  Resp: 18 16 20 18   Temp: (!) 97.5 F (36.4 C) 98.4 F (36.9 C) 97.7 F (36.5 C) 97.7 F (36.5 C)  TempSrc: Oral Oral Oral Oral  SpO2: 92% 100% 100% 100%  Weight:      Height:       SpO2: 100 % O2 Flow Rate (L/min): 3 L/min   Intake/Output Summary (Last 24 hours) at 02/24/2022  0909 Last data filed at 02/23/2022 1900 Gross per 24 hour  Intake 180 ml  Output 500 ml  Net -320 ml    Filed Weights   02/13/22 1521  Weight: 86 kg    Exam: General exam: In no acute distress. Respiratory system: Good air movement and clear to auscultation. Cardiovascular system: S1 & S2 heard, RRR. No JVD. Gastrointestinal system: Abdomen is nondistended, soft and nontender.  Extremities: No pedal edema. Skin: No rashes, lesions or ulcers Psychiatry: Judgement and insight appear normal. Mood & affect appropriate.    Data Reviewed:    Labs: Basic Metabolic Panel: Recent Labs  Lab 02/20/22 0406 02/21/22 0357 02/22/22 0425 02/23/22 0315 02/24/22 0408  NA 143 140 141 140 145  K 3.8 3.5 3.5 3.3* 3.2*  CL 111 109 108 109 114*  CO2 26 23 25 22 26   GLUCOSE 91 88 67* 197* 117*  BUN 10 9 8 10 10   CREATININE 0.76 0.79 0.80 0.90 0.80  CALCIUM 8.0* 7.8* 7.9* 8.2* 8.4*  MG 1.6* 1.9 1.9 1.8 2.1  PHOS 3.2 2.7 3.0 3.0 3.7    GFR Estimated Creatinine Clearance: 149.6 mL/min (by C-G formula based on SCr of 0.8 mg/dL). Liver Function Tests: Recent Labs  Lab 02/19/22 0507 02/20/22 0406 02/21/22 0357 02/22/22 0425 02/23/22 0315  AST 11* 14* 12* 14* 17  ALT 12 13 12 11 13   ALKPHOS 74 77 74 71 86  BILITOT 0.5 0.9 1.1 1.3* 0.8  PROT 6.2* 6.4* 6.5 6.5 6.9  ALBUMIN 2.2* 2.3* 2.2* 2.2* 2.3*    No results for input(s): LIPASE, AMYLASE in the last 168 hours. No results for input(s): AMMONIA in the last 168 hours. Coagulation profile No results for input(s): INR, PROTIME in the last 168 hours. COVID-19 Labs  No results for input(s): DDIMER, FERRITIN, LDH, CRP in the last 72 hours.  Lab Results  Component Value Date   SARSCOV2NAA NEGATIVE 02/13/2022   [place] NEGATIVE 10/07/2021   [place] NEGATIVE 08/23/2021   [place] NEGATIVE 05/07/2021    CBC: Recent Labs  Lab 02/19/22 0507 02/20/22 0406 02/21/22 0357 02/22/22 0425 02/23/22 0315  WBC 10.2 10.9*  12.0* 10.8* 10.5  NEUTROABS 6.3 7.3 8.6* 7.0 9.0*  HGB 7.0* 8.1* 8.8* 8.4* 9.1*  HCT 22.6* 25.4* 28.0* 27.0* 29.4*  MCV 100.9* 100.0 100.7* 101.9* 101.0*  PLT 111* 117* 154 150 195    Cardiac Enzymes: No results for input(s): CKTOTAL, CKMB, CKMBINDEX, TROPONINI in the last 168 hours. BNP (last 3 results) No results for input(s): PROBNP in the last 8760 hours. CBG: No results for input(s): GLUCAP in the last 168 hours. D-Dimer: No results for input(s): DDIMER in the last 72 hours. Hgb A1c: No results for input(s): HGBA1C in the last 72 hours. Lipid Profile: No results for input(s): CHOL, HDL, LDLCALC, TRIG, CHOLHDL, LDLDIRECT in the last 72 hours. Thyroid function studies: No results for input(s): TSH, T4TOTAL, T3FREE, THYROIDAB in the last 72 hours.  Invalid input(s): FREET3 Anemia work up: Recent Labs    02/23/22 [place]   Sepsis Labs: Recent Labs  Lab 02/20/22 0406 02/21/22 0357 02/22/22 0425 02/23/22 0315  WBC 10.9* 12.0* 10.8* 10.5    Microbiology No results found for this or any previous visit (from the past 240 hour(s)).    Medications:    bictegravir-emtricitabine-tenofovir AF  1 tablet Oral Daily   chlorhexidine  15 mL Mouth Rinse BID   Chlorhexidine Gluconate Cloth  6 each Topical Daily   clonazePAM  0.5 mg Oral BID   diclofenac Sodium  4 g Topical QID   famotidine  40 mg Oral Daily   feeding supplement  237 mL Oral BID BM   FLUoxetine  40 mg Oral Daily   folic acid  1 mg Oral Daily   haloperidol  5 mg Oral Daily   lactulose  20 g Oral BID   linaclotide  145 mcg Oral QAC breakfast   lurasidone  120 mg Oral Daily   mouth rinse  15 mL Mouth Rinse q12n4p   mouth rinse  15 mL Mouth Rinse q12n4p   midodrine  5 mg Oral TID WC   multivitamin with minerals  1 tablet Oral Daily   nicotine  21 mg Transdermal Daily   pantoprazole  40 mg Oral Daily   potassium chloride  40 mEq Oral BID   sodium chloride flush  10-40 mL Intracatheter Q12H    thiamine  100 mg Oral Daily   Or   thiamine  100 mg Intravenous Daily   valACYclovir  1,000 mg Oral Daily  Continuous Infusions:  sodium chloride 250 mL (02/20/22 0509)      LOS: 11 days   Charlynne Cousins  [facility]  02/24/2022, 9:09 AM

## 2022-02-24 NOTE — Progress Notes (Signed)
Chaplain received a page that [name]'s family needed some support.  His mother requested prayer for [name]'s healing.  Chaplain provided prayer and emotional support.  [address], [place] Pager, [phone]

## 2022-02-24 NOTE — TOC Progression Note (Signed)
Transition of Care [facility]) - Progression Note    Patient Details  Name: [name] MRN: [id number] Date of Birth: 23-Feb-1986  Transition of Care [facility]) CM/SW [place], [place] Phone Number: 02/24/2022, 12:11 PM  Clinical Narrative:     [facility] confirmed they can accept pt and are starting auth.   PASRR still pending; [facility] website shows updates that PASRR Screening went to a level II on 02/18/2022 at 16:05:22. A PASRR Reviewer was not assigned until 02/22/2022  at 07:22 when [place] Anderson(with [facility]) was assigned. CSW has been unable to assign himself in [facility] because [name] is assigned pt. CSW is unable to provide contact info so current phone number in system is for a CSW who is off. No calls from [name] have been received in [facility] general office.   CSW called [facility] to inquire about update. CSW is informed he needs to wait and that screeners normally come out in a few days. CSW requests to provide updated contact information which he is initially told can't be done. Eventually staff person on phone agrees to put a note in their system with CSW's contact info.   Expected Discharge Plan: [place] Barriers to Discharge: Ship broker, [place] Forensic scientist)  Expected Discharge Plan and Services Expected Discharge Plan: [place] In-house Referral: Clinical Social Work   Post Acute Care Choice: [place] arrangements for the past 2 months: Apartment                                       Social Determinants of Health (SDOH) Interventions    Readmission Risk Interventions    02/15/2022    2:35 PM 08/24/2021    4:00 PM 08/24/2021   11:54 AM  Readmission Risk Prevention Plan  Transportation Screening Complete Complete   PCP or Specialist Appt within 3-5 Days  Complete Complete  HRI or [place]  Complete Complete  Social Work Consult for [place]  Planning/Counseling   Complete  Palliative Care Screening  Complete Complete  Medication Review Press photographer) Complete Complete Complete  PCP or Specialist appointment within 3-5 days of discharge Complete    HRI or [place] Complete    SW Recovery Care/Counseling Consult Complete    [place] Not Applicable

## 2022-02-24 NOTE — Progress Notes (Signed)
   02/23/22 2000  What Happened  Was fall witnessed? No  Was patient injured? No  Patient found on floor  Found by Staff-comment  Stated prior activity to/from bed, chair, or stretcher  Follow Up  MD notified Olena Heckle  Time MD notified [place] notified No - patient refusal  Additional tests No  Simple treatment Other (comment) (n/a)  Progress note created (see row info) Yes

## 2022-02-24 NOTE — Care Management Important Message (Signed)
Important Message  Patient Details IM Letter given to the Patient. Name: [name] MRN: [id number] Date of Birth: 25-Sep-1986   Medicare Important Message Given:  Yes     Kerin Salen 02/24/2022, 10:13 AM

## 2022-02-24 NOTE — Progress Notes (Signed)
Pt has refused all po medication this shift.Also noted to not be swallowing his own salvia, he's drooling. No change noted in neuro status, pt will smile, nod and speak one word answers to questions. MD notified

## 2022-02-25 DIAGNOSIS — A419 Sepsis, unspecified organism: Secondary | ICD-10-CM | POA: Diagnosis not present

## 2022-02-25 LAB — MAGNESIUM: Magnesium: 2 mg/dL (ref 1.7–2.4)

## 2022-02-25 LAB — PHOSPHORUS: Phosphorus: 3 mg/dL (ref 2.5–4.6)

## 2022-02-25 NOTE — TOC Progression Note (Addendum)
Transition of Care [facility]) - Progression Note    Patient Details  Name: [name] MRN: [id number] Date of Birth: December 17, 1985  Transition of Care [facility]) CM/SW [place], [place] Phone Number: 02/25/2022, 2:20 PM  Clinical Narrative:     CSW called pt's brother and explained need for medications. He explained he would work on having someone go to pt's apartment to see if pt has medications biktarvy, latuda, and linzess at his apartment that could be provided to facility. CSW texted names of med to brother.   Pts mother then called this CSW. CSW provided update regarding meds. She states that she spoke with pt's brother who informed her of the meds. She is waiting to hear back from pt's home aide to see if aide can go into home and see if medications are there. Pt's mother is interested in a SNF in [place] though [facility] has no beds until sometime next week.   PASRR approved: [id number] F Expires 05/26/2022  1615: Pt's mom called and notified CSW that pt's aide brought meds to hospital and leave with RN. She states aide was unable to find the [place]. CSW inquired about what pharmacy pt uses to see if they could be refilled. She is unsure and states they are mailed. She is unsure of doctor who prescribes them as well.   Expected Discharge Plan: [place] Barriers to Discharge: Ship broker, [place] Forensic scientist)  Expected Discharge Plan and Services Expected Discharge Plan: [place] In-house Referral: Clinical Social Work   Post Acute Care Choice: [place] arrangements for the past 2 months: Apartment                                       Social Determinants of Health (SDOH) Interventions    Readmission Risk Interventions    02/15/2022    2:35 PM 08/24/2021    4:00 PM 08/24/2021   11:54 AM  Readmission Risk Prevention Plan  Transportation Screening Complete Complete   PCP or  Specialist Appt within 3-5 Days  Complete Complete  HRI or [place]  Complete Complete  Social Work Consult for [place] Planning/Counseling   Complete  Palliative Care Screening  Complete Complete  Medication Review Press photographer) Complete Complete Complete  PCP or Specialist appointment within 3-5 days of discharge Complete    HRI or [place] Complete    SW Recovery Care/Counseling Consult Complete    [place] Not Applicable

## 2022-02-25 NOTE — Progress Notes (Signed)
Home medications brought in by family, secured in pharmacy

## 2022-02-25 NOTE — Progress Notes (Signed)
Speech Language Pathology Treatment: Dysphagia  Patient Details Name: [name] MRN: [id number] DOB: [birth year] Today's Date: 02/25/2022 Time: 7017-7939 SLP Time Calculation (min) (ACUTE ONLY): 14 min  Assessment / Plan / Recommendation Clinical Impression  Pts mentation and attention to PO has improved from yesterday, but is still not as it was on Wednesda when pt was slef feeding soft foods. Today pt is able to take straw sips of honey thick liquids with verbal cues. He will grasp cup and take sips without any lingual thrusting and no resistance to straw or cup. No coughing. Pt will not easily accept soft solids. He is still orally defensive with solids, does not fully open mandible for bites, only nibbles. SLP inserted small pieces of a banana in his buccal cavity which did improve mastication, but was still uite slow with some pocketed material requiring liquid wash. Recommend dys 3 diet continue as pt is very unlikely to consume purees and also changes so rapidly that he may resume eating this weekend. Again, SLP visit do not seem beneficial as pts progress and decline seem to fluctuate with his mental status and access to medication. Will follow briefly into next week and determine needs.   HPI HPI: Patient is a 36 y.o. male who is well known to SLP secondary to frequent admissions and chronic dysphagia. He has PMH: HIV, bipolar disorder, PTSD, schizoaffective disorder, seizures. He presented to [facility] ED via EMS after being found on the floor at home by home health aide. Apparently, patient had an unwittnessed fall out of bed. When EMS arrived, they noted that patient had multiple medication and alcohol bottles spread around at home.  In ED, patient was briefly hypotensive with BP in 60's, CT chest/abdomen/pelvis showed no acute findings. Patient admitted with severe sepsis and was hypotensive, hypothermic, possible hypovolemia with poor PO intake; UA showed  trace leukocytes, positive nitrates.  Patient with unstageable wounds on both of his buttocks and sacrum. Pt seen by SLP with MBS (total of 5 now) recommending puree and honey thick liquids due to severe pharyngeal dysphagia. Pt able to upgrade to mechanical soft and on 5/17 SLP signed off as pt was self feeding and tolerating diet as well as expected given chronicity and severity of dysphagia. On 5/18 SLP was reordered to further cognitive decline and decreased ability to take PO in the setting of many days of refusal/inability to take medication.      SLP Plan         Recommendations for follow up therapy are one component of a multi-disciplinary discharge planning process, led by the attending physician.  Recommendations may be updated based on patient status, additional functional criteria and insurance authorization.    Recommendations  Diet recommendations: Dysphagia 3 (mechanical soft);Nectar-thick liquid Liquids provided via: Cup;Straw Medication Administration: Crushed with puree Supervision: Staff to assist with self feeding;Full supervision/cueing for compensatory strategies Compensations: Slow rate;Small sips/bites;Minimize environmental distractions Postural Changes and/or Swallow Maneuvers: Seated upright 90 degrees;Upright 30-60 min after meal                            Layal Javid, Katherene Ponto  02/25/2022, 10:08 AM

## 2022-02-25 NOTE — Progress Notes (Signed)
Attempting to get oob without assist. Telesitter attempting to reorient pt, this nurse entered room with 2 other staff member. Staff attempted to assist pt back into bed from sitting on the edge of the bed. Pt noted to be swinging arms and trying to stand up. Pt unable to stand, noted to be incont of bowel and bladder. With several attempts by staff to reorient pt, pt cleaned up and resting quietly in bed at present. Bed alarm in use, and telesitter on and at bedside. Callight within reach

## 2022-02-25 NOTE — TOC Progression Note (Addendum)
Transition of Care [facility]) - Progression Note    Patient Details  Name: [name] MRN: [id number] Date of Birth: [birth year]  Transition of Care [facility]) CM/SW [place],  Phone Number: 02/25/2022, 11:56 AM  Clinical Narrative:     Damaris Schooner with Shirlee Limerick at [facility]. Primary barrier are med costs. TOC awaiting response from mom to see if 30day supplies could be brought from home.   PASRR Received: [id number] F Expires 05/26/2022  Expected Discharge Plan: Skilled Nursing Facility Barriers to Discharge: Insurance Authorization, [place] (PASRR)  Expected Discharge Plan and Services Expected Discharge Plan: [place] In-house Referral: Clinical Social Work   Post Acute Care Choice: [place] arrangements for the past 2 months: Apartment                                       Social Determinants of Health (SDOH) Interventions    Readmission Risk Interventions    02/15/2022    2:35 PM 08/24/2021    4:00 PM 08/24/2021   11:54 AM  Readmission Risk Prevention Plan  Transportation Screening Complete Complete   PCP or Specialist Appt within 3-5 Days  Complete Complete  HRI or [place]  Complete Complete  Social Work Consult for [place] Planning/Counseling   Complete  Palliative Care Screening  Complete Complete  Medication Review Press photographer) Complete Complete Complete  PCP or Specialist appointment within 3-5 days of discharge Complete    HRI or [place] Complete    SW Recovery Care/Counseling Consult Complete    [place] Not Applicable

## 2022-02-25 NOTE — TOC Progression Note (Signed)
Transition of Care [facility]) - Progression Note    Patient Details  Name: [name] MRN: [id number] Date of Birth: 11/25/1985  Transition of Care [facility]) CM/SW [place], [place] Phone Number: 02/25/2022, 8:49 AM  Clinical Narrative:     CSW is notified by [facility] liaison that their corporate office has decided they cannot accept pt due to being unable pt's level of need. Pt has 2 other offers both under the same corporate office. TOC supervisor contacted [employer] office to discuss; Shirlee Limerick with [facility] is supposed to come evaluate pt in person on 02/25/22 for possible admission there.    Expected Discharge Plan: [place] Barriers to Discharge: Ship broker, [place] Forensic scientist)  Expected Discharge Plan and Services Expected Discharge Plan: [place] In-house Referral: Clinical Social Work   Post Acute Care Choice: [place] arrangements for the past 2 months: Apartment                                       Social Determinants of Health (SDOH) Interventions    Readmission Risk Interventions    02/15/2022    2:35 PM 08/24/2021    4:00 PM 08/24/2021   11:54 AM  Readmission Risk Prevention Plan  Transportation Screening Complete Complete   PCP or Specialist Appt within 3-5 Days  Complete Complete  HRI or [place]  Complete Complete  Social Work Consult for [place] Planning/Counseling   Complete  Palliative Care Screening  Complete Complete  Medication Review Press photographer) Complete Complete Complete  PCP or Specialist appointment within 3-5 days of discharge Complete    HRI or [place] Complete    SW Recovery Care/Counseling Consult Complete    [place] Not Applicable

## 2022-02-25 NOTE — Progress Notes (Addendum)
Physical Therapy Treatment Patient Details Name: [name] MRN: [id number] DOB: Feb 26, 1986 Today's Date: 02/25/2022   History of Present Illness 36 year old male with HIV, bipolar disorder, depression, PTSD, schizoaffective disorder, seizures presented to ED via EMS after found by home health aide on the floor. Dx of severe sepsis with shock, buttock wounds.    PT Comments    Pt agreeable to work with therapy but still only providing yes/no answers or nodding/shaking head. Required min assist for bed mobility, mod assist +2 for sit to stand transfer from elevated surface. Pt took extended seated rest break between sit to stand trials and requested end of session after second rep. Attempted to have pt complete exercises but pt declines, no reason specified. Pt refused transfer to chair so moved bed into chair position. Pt is not progressing and is getting weaker as compared to previous sessions. Discharge destination remains appropriate. We will continue to follow him acutely.     Recommendations for follow up therapy are one component of a multi-disciplinary discharge planning process, led by the attending physician.  Recommendations may be updated based on patient status, additional functional criteria and insurance authorization.  Follow Up Recommendations  Skilled nursing-short term rehab (<3 hours/day)     Assistance Recommended at Discharge Frequent or constant Supervision/Assistance  Patient can return home with the following Help with stairs or ramp for entrance;Assistance with cooking/housework;Assist for transportation;A lot of help with bathing/dressing/bathroom;A lot of help with walking and/or transfers;Direct supervision/assist for medications management;Direct supervision/assist for financial management   Equipment Recommendations  Other (comment) (TBD at SNF)    Recommendations for Other Services       Precautions / Restrictions Precautions Precautions: Fall;Other  (comment) Precaution Comments: monitor HR, pt denies falls in past 6 months (was found on floor by [facility] just prior to this admission) Restrictions Weight Bearing Restrictions: No     Mobility  Bed Mobility Overal bed mobility: Needs Assistance Bed Mobility: Supine to Sit, Sit to Supine     Supine to sit: Min assist     General bed mobility comments: pt min assist for bringing LLE off bed, Pt utilizes BUE to bring RLE off bed, otherwise min guard. For sit to supine, pt min assist for bringing BLE up into bed, total assist for scooting up in bed. Pt placed in chair position in bed at end of session.    Transfers Overall transfer level: Needs assistance Equipment used: Rolling walker (2 wheels) Transfers: Sit to/from Stand Sit to Stand: From elevated surface, +2 physical assistance, +2 safety/equipment, Mod assist           General transfer comment: Pt mod assist +2 from elevated surface, with lift assist and steadying of RW for sit to stand transfer. Pt unable to come to full upright stance before sitting back down for seated rest break ~96mn. Second attemp more successful, pt came to full upright standing but still required mod assist +2 for physical assist. Pt remained in upright standing for ~172m before sitting back down.    Ambulation/Gait               General Gait Details: Deferred, pt not safe to ambulate based on transfer level and general affective mood.   Stairs             Wheelchair Mobility    Modified Rankin (Stroke Patients Only)       Balance Overall balance assessment: Needs assistance Sitting-balance support: Feet supported, Bilateral upper extremity supported Sitting balance-Leahy  Scale: Poor     Standing balance support: Reliant on assistive device for balance, During functional activity, Bilateral upper extremity supported Standing balance-Leahy Scale: Poor Standing balance comment: Pt unable to come to full upright standing on first  attempt, maintains high degree of cervical and thoracic flexion despite cuing. Pt could stand fully upright on second attempt.                            Cognition Arousal/Alertness: Awake/alert Behavior During Therapy: Flat affect Overall Cognitive Status: No family/caregiver present to determine baseline cognitive functioning                                 General Comments: Pt not verbal, just nodding/shaking his head to questions.        Exercises      General Comments        Pertinent Vitals/Pain Pain Assessment Pain Assessment: No/denies pain    Home Living                          Prior Function            PT Goals (current goals can now be found in the care plan section) Acute Rehab PT Goals Patient Stated Goal: return home PT Goal Formulation: With patient Time For Goal Achievement: 03/02/22 Potential to Achieve Goals: Fair Progress towards PT goals: Not progressing toward goals - comment (Pt is declining, getting weaker, generally refusing to work with staff and requiring multiple requests before mobilizing.)    Frequency    Min 2X/week      PT Plan Current plan remains appropriate    Co-evaluation              AM-PAC PT "6 Clicks" Mobility   Outcome Measure  Help needed turning from your back to your side while in a flat bed without using bedrails?: A Little Help needed moving from lying on your back to sitting on the side of a flat bed without using bedrails?: A Little Help needed moving to and from a bed to a chair (including a wheelchair)?: A Lot Help needed standing up from a chair using your arms (e.g., wheelchair or bedside chair)?: A Lot Help needed to walk in hospital room?: A Lot Help needed climbing 3-5 steps with a railing? : Total 6 Click Score: 13    End of Session Equipment Utilized During Treatment: Gait belt Activity Tolerance: No increased pain;Patient limited by fatigue Patient left:  with call bell/phone within reach;in bed;with bed alarm set;with family/visitor present Nurse Communication: Mobility status PT Visit Diagnosis: Difficulty in walking, not elsewhere classified (R26.2);Pain Pain - Right/Left: Left Pain - part of body: Hip     Time: 2956-2130 PT Time Calculation (min) (ACUTE ONLY): 18 min  Charges:  $Therapeutic Activity: 8-22 mins                    Coolidge Breeze, PT, DPT [facility] Office: [phone] Pager: [phone]  Coolidge Breeze 02/25/2022, 4:41 PM

## 2022-02-25 NOTE — Progress Notes (Addendum)
[facility] PROGRESS NOTE    Progress Note  [name]  [name] DOB: May 03, 1986 DOA: 02/13/2022 PCP: Drew Ebbs, MD     Brief Narrative:   [name] is an 36 y.o. male past medical history of HIV, bipolar disorder depression PTSD and schizoaffective disorder along with seizures comes into the EMS as he was found by home health on the floor found to be altered and somnolent unable to provide history.  He had an apparent fall unwitnessed from the bed, his home health aide called EMS patient was found briefly hypotensive and hypothermic in the ED.  Patient was found to have multiple medications and alcohol sprout throughout the room.  CT scan of the abdomen pelvis showed fractures.  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  Awaiting placement to skilled nursing facility.  Assessment/Plan:   Severe Sepsis ([place]) possibly due to bilateral buttock wounds and hidradenitis suppurativa: Wean off steroids. Patient completed course of IV Rocephin and doxycycline on 02/20/2022. Wound care was consulted recommended to wash posterior upper thigh with soap pat dry keep the derma therapy line against the skin. Vitals are currently stable. Blood cultures are negative till date, urine culture showed multiple species. Physical therapy evaluated the patient recommended skilled nursing facility awaiting placement.  Acute metabolic encephalopathy: Likely due to infectious etiology.  Now resolved CT of the head showed no acute intracranial abnormalities. Continue lactulose. Physical therapy evaluated the patient recommended rehab.  HIV: Continue current regimen.  Hypotension: We will started on stress dose, has now been weaned off. Continue to titrate down midodrine.  Prolonged QTc: Try to keep potassium greater than 4 magnesium greater than 2.  Severe protein  caloric malnutrition: Counseling.  Continue Ensure 3 times daily. Speech evaluated the patient recommended dysphagia 3 diet.  History of bipolar disorder/schizoaffective disorder: Continue Klonopin, trazodone, Latuda and Haldol as needed.  Normocytic anemia: With a hemoglobin of 7.7, MCV of 100 check a B12 and a folate level. He is status post 1 unit of packed red blood cells on 02/19/2022.  Thrombocytosis: Likely reactive HIT panel negative.  Chronic alcohol abuse/liver cirrhosis alcoholic: No signs of withdrawal continue lactulose 3 times daily.  Hypokalemia: Replete orally try to keep greater than 4.  Hypophosphatemia: Try to keep phosphorus greater than 3.  Hypomagnesemia Magnesium is 1.8.  Noncompliance with medication. Patient continues to refuse his medication the previous doctor had a long conversation with the patient, and counseling about the failure to comply with medication could result in death at that time the patient relates he understood. On 02/20/2022 the patient is still on comparative refuses to speak this morning.   Unstageable pressure ulcer present on buttock on admission RN Pressure Injury Documentation: Pressure Injury 02/13/22 Buttocks Bilateral (Active)  02/13/22 2030  Location: Buttocks  Location Orientation: Bilateral  Staging:   Wound Description (Comments):   Present on Admission: Yes  Dressing Type Foam - Lift dressing to assess site every shift 02/21/22 1955     DVT prophylaxis: lovenox Family Communication:none Status is: Inpatient Remains inpatient appropriate because: Severe sepsis    Code Status:     Code Status Orders  (From admission, onward)           Start     Ordered   02/19/22 1609  Do not attempt resuscitation (DNR)  Continuous       Question Answer Comment  In  the event of cardiac or respiratory ARREST Do not call a "code blue"   In the event of cardiac or respiratory ARREST Do not perform Intubation, CPR,  defibrillation or ACLS   In the event of cardiac or respiratory ARREST Use medication by any route, position, wound care, and other measures to relive pain and suffering. May use oxygen, suction and manual treatment of airway obstruction as needed for comfort.      02/19/22 1608           Code Status History     Date Active Date Inactive Code Status Order ID Comments User Context   02/13/2022 2142 02/19/2022 1608 Full Code [id number]  [name], MD Inpatient   10/14/2021 1207 11/04/2021 1705 DNR [id number]  [name], NP Inpatient   10/07/2021 0228 10/14/2021 1206 Full Code [id number]  [name], MD ED   08/24/2021 0200 09/01/2021 0258 Full Code [id number]  [name], MD ED   05/07/2021 1912 05/19/2021 1841 Full Code [id number]  [name], MD ED   03/15/2020 0606 03/20/2020 1650 Full Code [id number]  [name], MD ED   10/27/2019 1815 11/02/2019 1902 Full Code [id number]  [name], MD Inpatient   07/22/2018 1722 07/22/2018 2158 Full Code [id number]  [name], NP Inpatient   07/22/2018 0832 07/22/2018 1703 Full Code [id number]  [name], MD ED   05/12/2015 1753 05/15/2015 1723 Full Code [id number]  [name], NP Inpatient   05/12/2015 0546 05/12/2015 1753 Full Code [id number]  [name], MD ED   01/12/2014 0256 01/12/2014 1650 Full Code [id number]  [name], MD ED         IV Access:   Peripheral IV   Procedures and diagnostic studies:   No results found.   Medical Consultants:   None.   Subjective:    [name] no complaints  Objective:    Vitals:   02/24/22 0700 02/24/22 1337 02/24/22 1929 02/25/22 0321  BP: 119/83 120/87 115/77 (!) 129/96  Pulse: (!) 101 85 82 87  Resp: 18 18 15 15   Temp: 97.7 F (36.5 C) 98.2 F (36.8 C) 98.1 F (36.7 C) 98.2 F (36.8 C)  TempSrc: Oral Oral Oral Oral  SpO2: 100% 100% 100%   Weight:      Height:       SpO2: 100 % O2 Flow Rate (L/min): 3  L/min   Intake/Output Summary (Last 24 hours) at 02/25/2022 0945 Last data filed at 02/24/2022 1700 Gross per 24 hour  Intake 80 ml  Output 400 ml  Net -320 ml    Filed Weights   02/13/22 1521  Weight: 86 kg    Exam: General exam: In no acute distress. Respiratory system: Good air movement and clear to auscultation. Cardiovascular system: S1 & S2 heard, RRR. No JVD. Gastrointestinal system: Abdomen is nondistended, soft and nontender.  Extremities: No pedal edema. Skin: No rashes, lesions or ulcers Psychiatry: Judgement and insight appear normal. Mood & affect appropriate.    Data Reviewed:    Labs: Basic Metabolic Panel: Recent Labs  Lab 02/20/22 0406 02/21/22 0357 02/22/22 0425 02/23/22 0315 02/24/22 0408 02/25/22 0413  NA 143 140 141 140 145  --   K 3.8 3.5 3.5 3.3* 3.2*  --   CL 111 109 108 109 114*  --   CO2 26 23 25 22 26   --   GLUCOSE 91 88 67* 197* 117*  --   BUN 10 9 8  10 10  --   CREATININE 0.76 0.79 0.80 0.90 0.80  --   CALCIUM 8.0* 7.8* 7.9* 8.2* 8.4*  --   MG 1.6* 1.9 1.9 1.8 2.1 2.0  PHOS 3.2 2.7 3.0 3.0 3.7 3.0    GFR Estimated Creatinine Clearance: 149.6 mL/min (by C-G formula based on SCr of 0.8 mg/dL). Liver Function Tests: Recent Labs  Lab 02/19/22 0507 02/20/22 0406 02/21/22 0357 02/22/22 0425 02/23/22 0315  AST 11* 14* 12* 14* 17  ALT 12 13 12 11 13   ALKPHOS 74 77 74 71 86  BILITOT 0.5 0.9 1.1 1.3* 0.8  PROT 6.2* 6.4* 6.5 6.5 6.9  ALBUMIN 2.2* 2.3* 2.2* 2.2* 2.3*    No results for input(s): LIPASE, AMYLASE in the last 168 hours. No results for input(s): AMMONIA in the last 168 hours. Coagulation profile No results for input(s): INR, PROTIME in the last 168 hours. COVID-19 Labs  No results for input(s): DDIMER, FERRITIN, LDH, CRP in the last 72 hours.  Lab Results  Component Value Date   SARSCOV2NAA NEGATIVE 02/13/2022   [place] NEGATIVE 10/07/2021   [place] NEGATIVE 08/23/2021   [place] NEGATIVE  05/07/2021    CBC: Recent Labs  Lab 02/19/22 0507 02/20/22 0406 02/21/22 0357 02/22/22 0425 02/23/22 0315 02/23/22 1239  WBC 10.2 10.9* 12.0* 10.8* 10.5  --   NEUTROABS 6.3 7.3 8.6* 7.0 9.0*  --   HGB 7.0* 8.1* 8.8* 8.4* 9.1*  --   HCT 22.6* 25.4* 28.0* 27.0* 29.4* 29.8*  MCV 100.9* 100.0 100.7* 101.9* 101.0*  --   PLT 111* 117* 154 150 195  --     Cardiac Enzymes: No results for input(s): CKTOTAL, CKMB, CKMBINDEX, TROPONINI in the last 168 hours. BNP (last 3 results) No results for input(s): PROBNP in the last 8760 hours. CBG: No results for input(s): GLUCAP in the last 168 hours. D-Dimer: No results for input(s): DDIMER in the last 72 hours. Hgb A1c: No results for input(s): HGBA1C in the last 72 hours. Lipid Profile: No results for input(s): CHOL, HDL, LDLCALC, TRIG, CHOLHDL, LDLDIRECT in the last 72 hours. Thyroid function studies: No results for input(s): TSH, T4TOTAL, T3FREE, THYROIDAB in the last 72 hours.  Invalid input(s): FREET3 Anemia work up: Recent Labs    02/23/22 [place]    Sepsis Labs: Recent Labs  Lab 02/20/22 0406 02/21/22 0357 02/22/22 0425 02/23/22 0315  WBC 10.9* 12.0* 10.8* 10.5    Microbiology No results found for this or any previous visit (from the past 240 hour(s)).    Medications:    bictegravir-emtricitabine-tenofovir AF  1 tablet Oral Daily   chlorhexidine  15 mL Mouth Rinse BID   Chlorhexidine Gluconate Cloth  6 each Topical Daily   clonazePAM  0.5 mg Oral BID   diclofenac Sodium  4 g Topical QID   famotidine  40 mg Oral Daily   feeding supplement  237 mL Oral BID BM   FLUoxetine  40 mg Oral Daily   folic acid  1 mg Oral Daily   haloperidol  5 mg Oral Daily   lactulose  20 g Oral BID   linaclotide  145 mcg Oral QAC breakfast   lurasidone  120 mg Oral Daily   mouth rinse  15 mL Mouth Rinse q12n4p   mouth rinse  15 mL Mouth Rinse q12n4p   midodrine  5 mg Oral TID WC   multivitamin with minerals  1  tablet Oral Daily   nicotine  21 mg Transdermal Daily  pantoprazole  40 mg Oral Daily   potassium chloride  40 mEq Oral BID   sodium chloride flush  10-40 mL Intracatheter Q12H   thiamine  100 mg Oral Daily   Or   thiamine  100 mg Intravenous Daily   valACYclovir  1,000 mg Oral Daily   Continuous Infusions:  sodium chloride 250 mL (02/20/22 0509)      LOS: 12 days   Charlynne Cousins  [facility]  02/25/2022, 9:45 AM

## 2022-02-26 DIAGNOSIS — A419 Sepsis, unspecified organism: Secondary | ICD-10-CM | POA: Diagnosis not present

## 2022-02-26 LAB — MAGNESIUM: Magnesium: 2 mg/dL (ref 1.7–2.4)

## 2022-02-26 LAB — PHOSPHORUS: Phosphorus: 3.3 mg/dL (ref 2.5–4.6)

## 2022-02-26 NOTE — Progress Notes (Signed)
[facility] PROGRESS NOTE    Progress Note  [name]  GGY:[id number] DOB: [birth year] DOA: 02/13/2022 PCP: [name], MD     Brief Narrative:   [name] is an 36 y.o. male past medical history of HIV, bipolar disorder depression PTSD and schizoaffective disorder along with seizures comes into the EMS as he was found by home health on the floor found to be altered and somnolent unable to provide history.  He had an apparent fall unwitnessed from the bed, his home health aide called EMS patient was found briefly hypotensive and hypothermic in the ED.  Patient was found to have multiple medications and alcohol sprout throughout the room.  CT scan of the abdomen pelvis showed fractures.  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  Awaiting placement to skilled nursing facility.  Assessment/Plan:   Severe Sepsis ([place]) possibly due to bilateral buttock wounds and hidradenitis suppurativa: Wean off steroids. Patient completed course of IV Rocephin and doxycycline on 02/20/2022. Vitals are currently stable. Blood cultures are negative till date, urine culture showed multiple species. Physical therapy evaluated the patient recommended skilled nursing facility awaiting placement.  Acute metabolic encephalopathy: Likely due to infectious etiology.  Now resolved Physical therapy evaluated the patient recommended rehab.  HIV: Continue current regimen.  Hypotension: We will started on stress dose, has now been weaned off. Continue to titrate down midodrine.  Prolonged QTc: Try to keep potassium greater than 4 magnesium greater than 2.  Severe protein caloric malnutrition: Counseling.  Continue Ensure 3 times daily. Speech evaluated the patient recommended dysphagia 3 diet.  History of bipolar disorder/schizoaffective disorder: Continue Klonopin, trazodone,  Latuda and Haldol as needed.  Normocytic anemia: With a hemoglobin of 7.7, MCV of 100 check a B12 and a folate level. He is status post 1 unit of packed red blood cells on 02/19/2022.  Thrombocytosis: Likely reactive HIT panel negative.  Chronic alcohol abuse/liver cirrhosis alcoholic: No signs of withdrawal continue lactulose 3 times daily.  Hypokalemia: Replete orally try to keep greater than 4.  Hypophosphatemia: Try to keep phosphorus greater than 3.  Hypomagnesemia Magnesium is 1.8.  Noncompliance with medication. Patient continues to refuse his medication the previous doctor had a long conversation with the patient, and counseling about the failure to comply with medication could result in death at that time the patient relates he understood. On 02/20/2022 the patient is still on comparative refuses to speak this morning.   Unstageable pressure ulcer present on buttock on admission RN Pressure Injury Documentation: Pressure Injury 02/13/22 Buttocks Bilateral (Active)  02/13/22 2030  Location: Buttocks  Location Orientation: Bilateral  Staging:   Wound Description (Comments):   Present on Admission: Yes  Dressing Type Foam - Lift dressing to assess site every shift 02/21/22 1955     DVT prophylaxis: lovenox Family Communication:none Status is: Inpatient Remains inpatient appropriate because: Severe sepsis    Code Status:     Code Status Orders  (From admission, onward)           Start     Ordered   02/19/22 1609  Do not attempt resuscitation (DNR)  Continuous       Question Answer Comment  In the event of cardiac or respiratory ARREST Do not call a "code blue"   In the event of cardiac or respiratory ARREST Do not perform Intubation, CPR, defibrillation or ACLS  In the event of cardiac or respiratory ARREST Use medication by any route, position, wound care, and other measures to relive pain and suffering. May use oxygen, suction and manual treatment of  airway obstruction as needed for comfort.      02/19/22 1608           Code Status History     Date Active Date Inactive Code Status Order ID Comments User Context   02/13/2022 2142 02/19/2022 1608 Full Code [id number]  [name], MD Inpatient   10/14/2021 1207 11/04/2021 1705 DNR [id number]  [name], NP Inpatient   10/07/2021 0228 10/14/2021 1206 Full Code [id number]  [name], MD ED   08/24/2021 0200 09/01/2021 0258 Full Code [id number]  [name], MD ED   05/07/2021 1912 05/19/2021 1841 Full Code [id number]  [name], MD ED   03/15/2020 0606 03/20/2020 1650 Full Code [id number]  [name], MD ED   10/27/2019 1815 11/02/2019 1902 Full Code [id number]  [name], MD Inpatient   07/22/2018 1722 07/22/2018 2158 Full Code [id number]  [name], NP Inpatient   07/22/2018 0832 07/22/2018 1703 Full Code [id number]  [name], MD ED   05/12/2015 1753 05/15/2015 1723 Full Code [id number]  [name], NP Inpatient   05/12/2015 0546 05/12/2015 1753 Full Code [id number]  [name], MD ED   01/12/2014 0256 01/12/2014 1650 Full Code [id number]  [name], MD ED         IV Access:   Peripheral IV   Procedures and diagnostic studies:   No results found.   Medical Consultants:   None.   Subjective:    [name] no complaints  Objective:    Vitals:   02/25/22 0321 02/25/22 1203 02/25/22 2021 02/26/22 0556  BP: (!) 129/96 126/90 (!) 134/97 125/89  Pulse: 87 97  93  Resp: 15 20  16   Temp: 98.2 F (36.8 C)   97.7 F (36.5 C)  TempSrc: Oral   Oral  SpO2:  100%  100%  Weight:      Height:       SpO2: 100 % O2 Flow Rate (L/min): 3 L/min   Intake/Output Summary (Last 24 hours) at 02/26/2022 0838 Last data filed at 02/26/2022 5974 Gross per 24 hour  Intake 230 ml  Output 650 ml  Net -420 ml    Filed Weights   02/13/22 1521  Weight: 86 kg    Exam: General exam: In no acute distress. Respiratory  system: Good air movement and clear to auscultation. Cardiovascular system: S1 & S2 heard, RRR. No JVD. Gastrointestinal system: Abdomen is nondistended, soft and nontender.  Extremities: No pedal edema. Skin: No rashes, lesions or ulcers Psychiatry: Judgement and insight appear normal. Mood & affect appropriate.    Data Reviewed:    Labs: Basic Metabolic Panel: Recent Labs  Lab 02/20/22 0406 02/21/22 0357 02/22/22 0425 02/23/22 0315 02/24/22 0408 02/25/22 0413 02/26/22 0356  NA 143 140 141 140 145  --   --   K 3.8 3.5 3.5 3.3* 3.2*  --   --   CL 111 109 108 109 114*  --   --   CO2 26 23 25 22 26   --   --   GLUCOSE 91 88 67* 197* 117*  --   --   BUN 10 9 8 10 10   --   --   CREATININE 0.76 0.79 0.80 0.90 0.80  --   --  CALCIUM 8.0* 7.8* 7.9* 8.2* 8.4*  --   --   MG 1.6* 1.9 1.9 1.8 2.1 2.0 2.0  PHOS 3.2 2.7 3.0 3.0 3.7 3.0 3.3    GFR Estimated Creatinine Clearance: 149.6 mL/min (by C-G formula based on SCr of 0.8 mg/dL). Liver Function Tests: Recent Labs  Lab 02/20/22 0406 02/21/22 0357 02/22/22 0425 02/23/22 0315  AST 14* 12* 14* 17  ALT 13 12 11 13   ALKPHOS 77 74 71 86  BILITOT 0.9 1.1 1.3* 0.8  PROT 6.4* 6.5 6.5 6.9  ALBUMIN 2.3* 2.2* 2.2* 2.3*    No results for input(s): LIPASE, AMYLASE in the last 168 hours. No results for input(s): AMMONIA in the last 168 hours. Coagulation profile No results for input(s): INR, PROTIME in the last 168 hours. COVID-19 Labs  No results for input(s): DDIMER, FERRITIN, LDH, CRP in the last 72 hours.  Lab Results  Component Value Date   SARSCOV2NAA NEGATIVE 02/13/2022   [place] NEGATIVE 10/07/2021   [place] NEGATIVE 08/23/2021   [place] NEGATIVE 05/07/2021    CBC: Recent Labs  Lab 02/20/22 0406 02/21/22 0357 02/22/22 0425 02/23/22 0315 02/23/22 1239  WBC 10.9* 12.0* 10.8* 10.5  --   NEUTROABS 7.3 8.6* 7.0 9.0*  --   HGB 8.1* 8.8* 8.4* 9.1*  --   HCT 25.4* 28.0* 27.0* 29.4* 29.8*  MCV 100.0  100.7* 101.9* 101.0*  --   PLT 117* 154 150 195  --     Cardiac Enzymes: No results for input(s): CKTOTAL, CKMB, CKMBINDEX, TROPONINI in the last 168 hours. BNP (last 3 results) No results for input(s): PROBNP in the last 8760 hours. CBG: No results for input(s): GLUCAP in the last 168 hours. D-Dimer: No results for input(s): DDIMER in the last 72 hours. Hgb A1c: No results for input(s): HGBA1C in the last 72 hours. Lipid Profile: No results for input(s): CHOL, HDL, LDLCALC, TRIG, CHOLHDL, LDLDIRECT in the last 72 hours. Thyroid function studies: No results for input(s): TSH, T4TOTAL, T3FREE, THYROIDAB in the last 72 hours.  Invalid input(s): FREET3 Anemia work up: Recent Labs    02/23/22 [place]    Sepsis Labs: Recent Labs  Lab 02/20/22 0406 02/21/22 0357 02/22/22 0425 02/23/22 0315  WBC 10.9* 12.0* 10.8* 10.5    Microbiology No results found for this or any previous visit (from the past 240 hour(s)).    Medications:    bictegravir-emtricitabine-tenofovir AF  1 tablet Oral Daily   chlorhexidine  15 mL Mouth Rinse BID   Chlorhexidine Gluconate Cloth  6 each Topical Daily   clonazePAM  0.5 mg Oral BID   diclofenac Sodium  4 g Topical QID   famotidine  40 mg Oral Daily   feeding supplement  237 mL Oral BID BM   FLUoxetine  40 mg Oral Daily   folic acid  1 mg Oral Daily   haloperidol  5 mg Oral Daily   lactulose  20 g Oral BID   linaclotide  145 mcg Oral QAC breakfast   lurasidone  120 mg Oral Daily   mouth rinse  15 mL Mouth Rinse q12n4p   mouth rinse  15 mL Mouth Rinse q12n4p   midodrine  5 mg Oral TID WC   multivitamin with minerals  1 tablet Oral Daily   nicotine  21 mg Transdermal Daily   pantoprazole  40 mg Oral Daily   sodium chloride flush  10-40 mL Intracatheter Q12H   thiamine  100 mg Oral Daily  Or   thiamine  100 mg Intravenous Daily   valACYclovir  1,000 mg Oral Daily   Continuous Infusions:  sodium chloride 250 mL  (02/20/22 0509)      LOS: 13 days   Charlynne Cousins  [facility]  02/26/2022, 8:38 AM

## 2022-02-26 NOTE — Progress Notes (Signed)
Routine dressing change to Right arm PICC line. Found PICC to be out approximately 5cm . Both ports flushed with good blood return. Site WNL.Patient RN notified MD regarding PICC now out approximately 5cm

## 2022-02-26 NOTE — Plan of Care (Signed)
Pt continues to refuse medications this shift and some treatments. Problem: Elimination: Goal: Will not experience complications related to bowel motility Outcome: Progressing   Problem: Pain Managment: Goal: General experience of comfort will improve Outcome: Progressing   Problem: Safety: Goal: Ability to remain free from injury will improve Outcome: Progressing   Problem: Education: Goal: Knowledge of General Education information will improve Description: Including pain rating scale, medication(s)/side effects and non-pharmacologic comfort measures Outcome: Not Progressing   Problem: Health Behavior/Discharge Planning: Goal: Ability to manage health-related needs will improve Outcome: Not Progressing   Problem: Clinical Measurements: Goal: Ability to maintain clinical measurements within normal limits will improve Outcome: Not Progressing Goal: Will remain free from infection Outcome: Not Progressing Goal: Cardiovascular complication will be avoided Outcome: Not Progressing   Problem: Nutrition: Goal: Adequate nutrition will be maintained Outcome: Not Progressing   Problem: Coping: Goal: Level of anxiety will decrease Outcome: Not Progressing   Problem: Elimination: Goal: Will not experience complications related to urinary retention Outcome: Not Progressing   Problem: Skin Integrity: Goal: Risk for impaired skin integrity will decrease Outcome: Not Progressing

## 2022-02-27 DIAGNOSIS — A419 Sepsis, unspecified organism: Secondary | ICD-10-CM | POA: Diagnosis not present

## 2022-02-27 LAB — MAGNESIUM: Magnesium: 1.7 mg/dL (ref 1.7–2.4)

## 2022-02-27 LAB — PHOSPHORUS: Phosphorus: 3 mg/dL (ref 2.5–4.6)

## 2022-02-27 MED ORDER — SODIUM CHLORIDE 0.9 % IV BOLUS
1000.0000 mL | Freq: Once | INTRAVENOUS | Status: AC
Start: 2022-02-27 — End: 2022-02-27
  Administered 2022-02-27: 1000 mL via INTRAVENOUS

## 2022-02-27 NOTE — Progress Notes (Signed)
[facility] PROGRESS NOTE    Progress Note  [name]  JOI:[id number] DOB: 02-04-1986 DOA: 02/13/2022 PCP: [name], MD     Brief Narrative:   [name] is an 36 y.o. male past medical history of HIV, bipolar disorder depression PTSD and schizoaffective disorder along with seizures comes into the EMS as he was found by home health on the floor found to be altered and somnolent unable to provide history.  He had an apparent fall unwitnessed from the bed, his home health aide called EMS patient was found briefly hypotensive and hypothermic in the ED.  Patient was found to have multiple medications and alcohol sprout throughout the room.  CT scan of the abdomen pelvis showed fractures.  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  Awaiting placement to skilled nursing facility.  Assessment/Plan:   Severe Sepsis ([place]) possibly due to bilateral buttock wounds and hidradenitis suppurativa: Wean off steroids. Patient completed course of IV Rocephin and doxycycline on 02/20/2022. Vitals are currently stable. Blood cultures are negative till date, urine culture showed multiple species. Physical therapy evaluated the patient recommended skilled nursing facility awaiting placement.  Acute metabolic encephalopathy: Likely due to infectious etiology.  Now resolved Physical therapy evaluated the patient recommended rehab.  HIV: Continue current regimen.  Hypotension: We will started on stress dose, has now been weaned off. Continue to titrate down midodrine.  Prolonged QTc: Try to keep potassium greater than 4 magnesium greater than 2.  Severe protein caloric malnutrition: Counseling.  Continue Ensure 3 times daily. Speech evaluated the patient recommended dysphagia 3 diet.  History of bipolar disorder/schizoaffective disorder: Continue Klonopin, trazodone,  Latuda and Haldol as needed.  Normocytic anemia: With a hemoglobin of 7.7, MCV of 100 check a B12 and a folate level. He is status post 1 unit of packed red blood cells on 02/19/2022.  Thrombocytosis: Likely reactive HIT panel negative.  Chronic alcohol abuse/liver cirrhosis alcoholic: No signs of withdrawal continue lactulose 3 times daily.  Hypokalemia: Replete orally try to keep greater than 4.  Hypophosphatemia: Try to keep phosphorus greater than 3.  Hypomagnesemia Magnesium is 1.8.  Noncompliance with medication. Patient continues to refuse his medication the previous doctor had a long conversation with the patient, and counseling about the failure to comply with medication could result in death at that time the patient relates he understood. On 02/20/2022 the patient is still on comparative refuses to speak this morning.   Unstageable pressure ulcer present on buttock on admission RN Pressure Injury Documentation: Pressure Injury 02/13/22 Buttocks Bilateral (Active)  02/13/22 2030  Location: Buttocks  Location Orientation: Bilateral  Staging:   Wound Description (Comments):   Present on Admission: Yes  Dressing Type Foam - Lift dressing to assess site every shift 02/26/22 1245     DVT prophylaxis: lovenox Family Communication:none Status is: Inpatient Remains inpatient appropriate because: Severe sepsis    Code Status:     Code Status Orders  (From admission, onward)           Start     Ordered   02/19/22 1609  Do not attempt resuscitation (DNR)  Continuous       Question Answer Comment  In the event of cardiac or respiratory ARREST Do not call a "code blue"   In the event of cardiac or respiratory ARREST Do not perform Intubation, CPR, defibrillation or ACLS  In the event of cardiac or respiratory ARREST Use medication by any route, position, wound care, and other measures to relive pain and suffering. May use oxygen, suction and manual treatment of  airway obstruction as needed for comfort.      02/19/22 1608           Code Status History     Date Active Date Inactive Code Status Order ID Comments User Context   02/13/2022 2142 02/19/2022 1608 Full Code [id number]  [name], MD Inpatient   10/14/2021 1207 11/04/2021 1705 DNR [id number]  [name], NP Inpatient   10/07/2021 0228 10/14/2021 1206 Full Code [id number]  [name], MD ED   08/24/2021 0200 09/01/2021 0258 Full Code [id number]  [name], MD ED   05/07/2021 1912 05/19/2021 1841 Full Code [id number]  [name], MD ED   03/15/2020 0606 03/20/2020 1650 Full Code [id number]  [name], MD ED   10/27/2019 1815 11/02/2019 1902 Full Code [id number]  [name], MD Inpatient   07/22/2018 1722 07/22/2018 2158 Full Code [id number]  [name], NP Inpatient   07/22/2018 0832 07/22/2018 1703 Full Code [id number]  [name], MD ED   05/12/2015 1753 05/15/2015 1723 Full Code [id number]  [name], NP Inpatient   05/12/2015 0546 05/12/2015 1753 Full Code [id number]  [name], MD ED   01/12/2014 0256 01/12/2014 1650 Full Code [id number]  [name], MD ED         IV Access:   Peripheral IV   Procedures and diagnostic studies:   No results found.   Medical Consultants:   None.   Subjective:    [name] no complaints  Objective:    Vitals:   02/26/22 1400 02/26/22 2300 02/27/22 0300 02/27/22 0637  BP: 114/75 114/68 103/65 94/64  Pulse: (!) 105 96 99 99  Resp: 15 16 16 18   Temp: 98.8 F (37.1 C) 98.8 F (37.1 C) 98.7 F (37.1 C) 98.1 F (36.7 C)  TempSrc: Oral Oral Oral Oral  SpO2:  98% 96% 95%  Weight:      Height:       SpO2: 95 % O2 Flow Rate (L/min): 3 L/min   Intake/Output Summary (Last 24 hours) at 02/27/2022 0816 Last data filed at 02/27/2022 0757 Gross per 24 hour  Intake 940 ml  Output 400 ml  Net 540 ml    Filed Weights   02/13/22 1521  Weight: 86 kg    Exam: General  exam: In no acute distress. Respiratory system: Good air movement and clear to auscultation. Cardiovascular system: S1 & S2 heard, RRR. No JVD. Gastrointestinal system: Abdomen is nondistended, soft and nontender.  Extremities: No pedal edema. Skin: No rashes, lesions or ulcers Psychiatry: Judgement and insight appear normal. Mood & affect appropriate.    Data Reviewed:    Labs: Basic Metabolic Panel: Recent Labs  Lab 02/21/22 0357 02/22/22 0425 02/23/22 0315 02/24/22 0408 02/25/22 0413 02/26/22 0356 02/27/22 0342  NA 140 141 140 145  --   --   --   K 3.5 3.5 3.3* 3.2*  --   --   --   CL 109 108 109 114*  --   --   --   CO2 23 25 22 26   --   --   --   GLUCOSE 88 67* 197* 117*  --   --   --   BUN 9 8 10 10   --   --   --  CREATININE 0.79 0.80 0.90 0.80  --   --   --   CALCIUM 7.8* 7.9* 8.2* 8.4*  --   --   --   MG 1.9 1.9 1.8 2.1 2.0 2.0 1.7  PHOS 2.7 3.0 3.0 3.7 3.0 3.3 3.0    GFR Estimated Creatinine Clearance: 149.6 mL/min (by C-G formula based on SCr of 0.8 mg/dL). Liver Function Tests: Recent Labs  Lab 02/21/22 0357 02/22/22 0425 02/23/22 0315  AST 12* 14* 17  ALT 12 11 13   ALKPHOS 74 71 86  BILITOT 1.1 1.3* 0.8  PROT 6.5 6.5 6.9  ALBUMIN 2.2* 2.2* 2.3*    No results for input(s): LIPASE, AMYLASE in the last 168 hours. No results for input(s): AMMONIA in the last 168 hours. Coagulation profile No results for input(s): INR, PROTIME in the last 168 hours. COVID-19 Labs  No results for input(s): DDIMER, FERRITIN, LDH, CRP in the last 72 hours.  Lab Results  Component Value Date   SARSCOV2NAA NEGATIVE 02/13/2022   SARSCOV2NAA NEGATIVE 10/07/2021   SARSCOV2NAA NEGATIVE 08/23/2021   [place] NEGATIVE 05/07/2021    CBC: Recent Labs  Lab 02/21/22 0357 02/22/22 0425 02/23/22 0315 02/23/22 1239  WBC 12.0* 10.8* 10.5  --   NEUTROABS 8.6* 7.0 9.0*  --   HGB 8.8* 8.4* 9.1*  --   HCT 28.0* 27.0* 29.4* 29.8*  MCV 100.7* 101.9* 101.0*  --   PLT  154 150 195  --     Cardiac Enzymes: No results for input(s): CKTOTAL, CKMB, CKMBINDEX, TROPONINI in the last 168 hours. BNP (last 3 results) No results for input(s): PROBNP in the last 8760 hours. CBG: No results for input(s): GLUCAP in the last 168 hours. D-Dimer: No results for input(s): DDIMER in the last 72 hours. Hgb A1c: No results for input(s): HGBA1C in the last 72 hours. Lipid Profile: No results for input(s): CHOL, HDL, LDLCALC, TRIG, CHOLHDL, LDLDIRECT in the last 72 hours. Thyroid function studies: No results for input(s): TSH, T4TOTAL, T3FREE, THYROIDAB in the last 72 hours.  Invalid input(s): FREET3 Anemia work up: No results for input(s): VITAMINB12, FOLATE, FERRITIN, TIBC, IRON, RETICCTPCT in the last 72 hours.  Sepsis Labs: Recent Labs  Lab 02/21/22 0357 02/22/22 0425 02/23/22 0315  WBC 12.0* 10.8* 10.5    Microbiology No results found for this or any previous visit (from the past 240 hour(s)).    Medications:    bictegravir-emtricitabine-tenofovir AF  1 tablet Oral Daily   chlorhexidine  15 mL Mouth Rinse BID   Chlorhexidine Gluconate Cloth  6 each Topical Daily   clonazePAM  0.5 mg Oral BID   diclofenac Sodium  4 g Topical QID   famotidine  40 mg Oral Daily   feeding supplement  237 mL Oral BID BM   FLUoxetine  40 mg Oral Daily   folic acid  1 mg Oral Daily   haloperidol  5 mg Oral Daily   lactulose  20 g Oral BID   linaclotide  145 mcg Oral QAC breakfast   lurasidone  120 mg Oral Daily   mouth rinse  15 mL Mouth Rinse q12n4p   mouth rinse  15 mL Mouth Rinse q12n4p   midodrine  5 mg Oral TID WC   multivitamin with minerals  1 tablet Oral Daily   nicotine  21 mg Transdermal Daily   pantoprazole  40 mg Oral Daily   sodium chloride flush  10-40 mL Intracatheter Q12H   thiamine  100 mg Oral Daily  Or   thiamine  100 mg Intravenous Daily   valACYclovir  1,000 mg Oral Daily   Continuous Infusions:  sodium chloride 250 mL (02/20/22 0509)       LOS: 14 days   Charlynne Cousins  [facility]  02/27/2022, 8:16 AM

## 2022-02-27 NOTE — TOC Progression Note (Signed)
Transition of Care [facility]) - Progression Note    Patient Details  Name: SAMAN UMSTEAD MRN: [id number] Date of Birth: 1986/08/14  [facility]) CM/SW Contact  Ross Ludwig, [place] Phone Number: 02/27/2022, 6:35 PM  Clinical Narrative:     TOC continuing to follow patient's progress throughout discharge planning.  Leadership working with SNFs to try go get him placed.  [place] number back [id number] F expiration 05/26/2022.  Expected Discharge Plan: [place] Barriers to Discharge: Ship broker, [place] Forensic scientist)  Expected Discharge Plan and Services Expected Discharge Plan: [place] In-house Referral: Clinical Social Work   Post Acute Care Choice: [place] arrangements for the past 2 months: Apartment                                       Social Determinants of Health (SDOH) Interventions    Readmission Risk Interventions    02/15/2022    2:35 PM 08/24/2021    4:00 PM 08/24/2021   11:54 AM  Readmission Risk Prevention Plan  Transportation Screening Complete Complete   PCP or Specialist Appt within 3-5 Days  Complete Complete  HRI or [place]  Complete Complete  Social Work Consult for [place] Planning/Counseling   Complete  Palliative Care Screening  Complete Complete  Medication Review Press photographer) Complete Complete Complete  PCP or Specialist appointment within 3-5 days of discharge Complete    HRI or [place] Complete    SW Recovery Care/Counseling Consult Complete    [place] Not Applicable

## 2022-02-28 DIAGNOSIS — Z515 Encounter for palliative care: Secondary | ICD-10-CM | POA: Diagnosis not present

## 2022-02-28 DIAGNOSIS — B20 Human immunodeficiency virus [HIV] disease: Secondary | ICD-10-CM | POA: Diagnosis not present

## 2022-02-28 DIAGNOSIS — A419 Sepsis, unspecified organism: Secondary | ICD-10-CM | POA: Diagnosis not present

## 2022-02-28 LAB — MAGNESIUM: Magnesium: 1.7 mg/dL (ref 1.7–2.4)

## 2022-02-28 LAB — PHOSPHORUS: Phosphorus: 3.6 mg/dL (ref 2.5–4.6)

## 2022-02-28 MED ORDER — ALTEPLASE 2 MG IJ SOLR
2.0000 mg | Freq: Once | INTRAMUSCULAR | Status: AC
  Administered 2022-02-28: 2 mg
  Filled 2022-02-28: qty 2

## 2022-02-28 MED ORDER — MAGNESIUM OXIDE -MG SUPPLEMENT 400 (240 MG) MG PO TABS
400.0000 mg | ORAL_TABLET | Freq: Two times a day (BID) | ORAL | Status: AC
  Administered 2022-02-28 (×2): 400 mg via ORAL
  Filled 2022-02-28 (×2): qty 1

## 2022-02-28 NOTE — Progress Notes (Signed)
Physical Therapy Treatment Patient Details Name: [name] MRN: [id number] DOB: 18-May-1986 Today's Date: 02/28/2022   History of Present Illness 36 year old male with HIV, bipolar disorder, depression, PTSD, schizoaffective disorder, seizures presented to ED via EMS after found by home health aide on the floor. Dx of severe sepsis with shock, buttock wounds.    PT Comments    Pt generally in better spirits today, speaking in short phrases and smiling at therapy staff. Pt in recliner at start and end of session today. Required min assist for transfers and min guard +2 for recliner follow for safety for short-distance ambulation. Pt able to stand with BUE on RW for gown change and linen change ~68mn without break, fully upright without assist from PT. Completed short exercise program for BLEs. Pt primarily limited by fatigue today. We will continue to follow him acutely to promote safe discharge to the destination below.    Recommendations for follow up therapy are one component of a multi-disciplinary discharge planning process, led by the attending physician.  Recommendations may be updated based on patient status, additional functional criteria and insurance authorization.  Follow Up Recommendations  Skilled nursing-short term rehab (<3 hours/day)     Assistance Recommended at Discharge Frequent or constant Supervision/Assistance  Patient can return home with the following Help with stairs or ramp for entrance;Assistance with cooking/housework;Assist for transportation;A lot of help with bathing/dressing/bathroom;A lot of help with walking and/or transfers;Direct supervision/assist for medications management;Direct supervision/assist for financial management   Equipment Recommendations  Other (comment) (TBD at SNF)    Recommendations for Other Services       Precautions / Restrictions Precautions Precautions: Fall;Other (comment) Precaution Comments: monitor HR, pt denies falls  in past 6 months (was found on floor by [facility] just prior to this admission) Restrictions Weight Bearing Restrictions: No     Mobility  Bed Mobility               General bed mobility comments: Pt in recliner at start and end of session today    Transfers Overall transfer level: Needs assistance Equipment used: Rolling walker (2 wheels) Transfers: Sit to/from Stand Sit to Stand: +2 safety/equipment, Min assist           General transfer comment: Pt min assist for sit to stand transfer for steadying of RW and light lift assist, pt using BUE to stand from low recliner surface, completed 2 reps, second rep pt was min guard only, no physical assist required. No overt LOB noted. Pt able to stand for 342m for gown change and to put pillows on recliner seat to make more comfortable.    Ambulation/Gait Ambulation/Gait assistance: Min guard, +2 safety/equipment Gait Distance (Feet): 8 Feet Assistive device: Rolling walker (2 wheels) Gait Pattern/deviations: Step-through pattern, Decreased stride length, Decreased dorsiflexion - right, Decreased dorsiflexion - left (trouble advancing RLE) Gait velocity: decr     General Gait Details: Pt ambulated with RW and min guard assist, +2 for recliner follow for safety, no overt LOB noted. Pt with antalgic gait pattern and decreased dorsiflexion bilaterally, pt had increased difficulty advancing RLE.   Stairs             Wheelchair Mobility    Modified Rankin (Stroke Patients Only)       Balance Overall balance assessment: Needs assistance Sitting-balance support: Feet supported, Bilateral upper extremity supported Sitting balance-Leahy Scale: Poor     Standing balance support: Reliant on assistive device for balance, During functional activity, Bilateral upper extremity  supported Standing balance-Leahy Scale: Poor Standing balance comment: Pt able to remain in static stance with BUE support on RW for 3 min without assist  during gown and linen change.                            Cognition Arousal/Alertness: Awake/alert Behavior During Therapy: Flat affect Overall Cognitive Status: No family/caregiver present to determine baseline cognitive functioning                                 General Comments: Pt generally more pleasant today, speaking in short sentences and smiling.        Exercises General Exercises - Lower Extremity Long Arc Quad: AROM, AAROM, Right, Left, 10 reps, Seated (pt provided self AAROM for Left LAQ, right was active. VCs for slow and controlled) Hip Flexion/Marching: AROM, Both, 10 reps, Seated    General Comments        Pertinent Vitals/Pain Pain Assessment Pain Assessment: Faces Faces Pain Scale: Hurts a little bit Pain Location: L hip Pain Descriptors / Indicators: Sore Pain Intervention(s): Monitored during session, Repositioned    Home Living                          Prior Function            PT Goals (current goals can now be found in the care plan section) Acute Rehab PT Goals Patient Stated Goal: return home PT Goal Formulation: With patient Time For Goal Achievement: 03/02/22 Potential to Achieve Goals: Fair Progress towards PT goals: Progressing toward goals    Frequency    Min 2X/week      PT Plan Current plan remains appropriate    Co-evaluation              AM-PAC PT "6 Clicks" Mobility   Outcome Measure  Help needed turning from your back to your side while in a flat bed without using bedrails?: A Little Help needed moving from lying on your back to sitting on the side of a flat bed without using bedrails?: A Little Help needed moving to and from a bed to a chair (including a wheelchair)?: A Lot Help needed standing up from a chair using your arms (e.g., wheelchair or bedside chair)?: A Lot Help needed to walk in hospital room?: A Lot Help needed climbing 3-5 steps with a railing? : Total 6 Click  Score: 13    End of Session Equipment Utilized During Treatment: Gait belt Activity Tolerance: No increased pain;Patient limited by fatigue Patient left: with call bell/phone within reach;in chair;with chair alarm set Nurse Communication: Mobility status PT Visit Diagnosis: Difficulty in walking, not elsewhere classified (R26.2);Pain Pain - Right/Left: Left Pain - part of body: Hip     Time: 1497-0263 PT Time Calculation (min) (ACUTE ONLY): 17 min  Charges:  $Therapeutic Exercise: 8-22 mins                    Coolidge Breeze, PT, DPT [facility] Department Office: [phone] Pager: [phone]   Coolidge Breeze 02/28/2022, 12:27 PM

## 2022-02-28 NOTE — Progress Notes (Signed)
[facility] PROGRESS NOTE    Progress Note  [name]  JOI:[id number] DOB: 1985/11/27 DOA: 02/13/2022 PCP: [name], MD     Brief Narrative:   [name] is an 36 y.o. male past medical history of HIV, bipolar disorder depression PTSD and schizoaffective disorder along with seizures comes into the EMS as he was found by home health on the floor found to be altered and somnolent unable to provide history.  He had an apparent fall unwitnessed from the bed, his home health aide called EMS patient was found briefly hypotensive and hypothermic in the ED.  Patient was found to have multiple medications and alcohol sprout throughout the room.  CT scan of the abdomen pelvis showed fractures.  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  Awaiting placement to skilled nursing facility.  Assessment/Plan:   Severe Sepsis ([place]) possibly due to bilateral buttock wounds and hidradenitis suppurativa: Wean off steroids. Patient completed course of IV Rocephin and doxycycline on 02/20/2022. Vitals are currently stable. Physical therapy evaluated the patient recommended skilled nursing facility awaiting placement.  Acute metabolic encephalopathy: Likely due to infectious etiology.  Now resolved Physical therapy evaluated the patient recommended rehab.  HIV: Continue current regimen.  Hypotension: We will started on stress dose, has now been weaned off. Continue to titrate down midodrine.  Prolonged QTc: Try to keep potassium greater than 4 magnesium greater than 2.  Severe protein caloric malnutrition: Counseling.  Continue Ensure 3 times daily. Speech evaluated the patient recommended dysphagia 3 diet.  History of bipolar disorder/schizoaffective disorder: Continue Klonopin, trazodone, Latuda and Haldol as needed.  Normocytic anemia: With a hemoglobin of 7.7,  MCV of 100 check a B12 and a folate level. He is status post 1 unit of packed red blood cells on 02/19/2022.  Thrombocytosis: Likely reactive HIT panel negative.  Chronic alcohol abuse/liver cirrhosis alcoholic: No signs of withdrawal continue lactulose 3 times daily.  Hypokalemia: Replete orally try to keep greater than 4.  Hypophosphatemia: Try to keep phosphorus greater than 3.  Hypomagnesemia Magnesium is 1.8.  Noncompliance with medication. Patient continues to refuse his medication the previous doctor had a long conversation with the patient, and counseling about the failure to comply with medication could result in death at that time the patient relates he understood. On 02/20/2022 the patient is still on comparative refuses to speak this morning.   Unstageable pressure ulcer present on buttock on admission RN Pressure Injury Documentation: Pressure Injury 02/13/22 Buttocks Bilateral (Active)  02/13/22 2030  Location: Buttocks  Location Orientation: Bilateral  Staging:   Wound Description (Comments):   Present on Admission: Yes  Dressing Type Foam - Lift dressing to assess site every shift 02/27/22 2215     DVT prophylaxis: lovenox Family Communication:none Status is: Inpatient Remains inpatient appropriate because: Severe sepsis    Code Status:     Code Status Orders  (From admission, onward)           Start     Ordered   02/19/22 1609  Do not attempt resuscitation (DNR)  Continuous       Question Answer Comment  In the event of cardiac or respiratory ARREST Do not call a "code blue"   In the event of cardiac or respiratory ARREST Do not perform Intubation, CPR, defibrillation or ACLS   In the event of cardiac or respiratory ARREST Use medication by  any route, position, wound care, and other measures to relive pain and suffering. May use oxygen, suction and manual treatment of airway obstruction as needed for comfort.      02/19/22 1608            Code Status History     Date Active Date Inactive Code Status Order ID Comments User Context   02/13/2022 2142 02/19/2022 1608 Full Code [id number]  [name], MD Inpatient   10/14/2021 1207 11/04/2021 1705 DNR [id number]  [name], NP Inpatient   10/07/2021 0228 10/14/2021 1206 Full Code [id number]  [name], MD ED   08/24/2021 0200 09/01/2021 0258 Full Code [id number]  [name], MD ED   05/07/2021 1912 05/19/2021 1841 Full Code [id number]  [name], MD ED   03/15/2020 0606 03/20/2020 1650 Full Code [id number]  [name], MD ED   10/27/2019 1815 11/02/2019 1902 Full Code [id number]  [name], MD Inpatient   07/22/2018 1722 07/22/2018 2158 Full Code [id number]  [name], NP Inpatient   07/22/2018 0832 07/22/2018 1703 Full Code [id number]  [name], MD ED   05/12/2015 1753 05/15/2015 1723 Full Code [id number]  [name], NP Inpatient   05/12/2015 0546 05/12/2015 1753 Full Code [id number]  [name], MD ED   01/12/2014 0256 01/12/2014 1650 Full Code [id number]  [name], MD ED         IV Access:   Peripheral IV   Procedures and diagnostic studies:   No results found.   Medical Consultants:   None.   Subjective:    [name] no complaints this morning. Objective:    Vitals:   02/27/22 0637 02/27/22 1333 02/27/22 2043 02/28/22 0627  BP: 94/64 100/69 102/68 106/71  Pulse: 99 89 90 (!) 110  Resp: 18 16 18 18   Temp: 98.1 F (36.7 C) 98.2 F (36.8 C) 98.5 F (36.9 C) 98.5 F (36.9 C)  TempSrc: Oral Oral Oral Oral  SpO2: 95% 99% 98% 99%  Weight:      Height:       SpO2: 99 % O2 Flow Rate (L/min): 3 L/min   Intake/Output Summary (Last 24 hours) at 02/28/2022 0849 Last data filed at 02/28/2022 0500 Gross per 24 hour  Intake 1480 ml  Output 1850 ml  Net -370 ml    Filed Weights   02/13/22 1521  Weight: 86 kg    Exam: General exam: In no acute distress. Respiratory system: Good air  movement and clear to auscultation. Cardiovascular system: S1 & S2 heard, RRR. No JVD. Gastrointestinal system: Abdomen is nondistended, soft and nontender.  Extremities: No pedal edema. Skin: No rashes, lesions or ulcers Psychiatry: Judgement and insight appear normal. Mood & affect appropriate.    Data Reviewed:    Labs: Basic Metabolic Panel: Recent Labs  Lab 02/22/22 0425 02/23/22 0315 02/24/22 0408 02/25/22 0413 02/26/22 0356 02/27/22 0342 02/28/22 0320  NA 141 140 145  --   --   --   --   K 3.5 3.3* 3.2*  --   --   --   --   CL 108 109 114*  --   --   --   --   CO2 25 22 26   --   --   --   --   GLUCOSE 67* 197* 117*  --   --   --   --   BUN 8 10 10   --   --   --   --  CREATININE 0.80 0.90 0.80  --   --   --   --   CALCIUM 7.9* 8.2* 8.4*  --   --   --   --   MG 1.9 1.8 2.1 2.0 2.0 1.7 1.7  PHOS 3.0 3.0 3.7 3.0 3.3 3.0 3.6    GFR Estimated Creatinine Clearance: 149.6 mL/min (by C-G formula based on SCr of 0.8 mg/dL). Liver Function Tests: Recent Labs  Lab 02/22/22 0425 02/23/22 0315  AST 14* 17  ALT 11 13  ALKPHOS 71 86  BILITOT 1.3* 0.8  PROT 6.5 6.9  ALBUMIN 2.2* 2.3*    No results for input(s): LIPASE, AMYLASE in the last 168 hours. No results for input(s): AMMONIA in the last 168 hours. Coagulation profile No results for input(s): INR, PROTIME in the last 168 hours. COVID-19 Labs  No results for input(s): DDIMER, FERRITIN, LDH, CRP in the last 72 hours.  Lab Results  Component Value Date   SARSCOV2NAA NEGATIVE 02/13/2022   SARSCOV2NAA NEGATIVE 10/07/2021   SARSCOV2NAA NEGATIVE 08/23/2021   [place] NEGATIVE 05/07/2021    CBC: Recent Labs  Lab 02/22/22 0425 02/23/22 0315 02/23/22 1239  WBC 10.8* 10.5  --   NEUTROABS 7.0 9.0*  --   HGB 8.4* 9.1*  --   HCT 27.0* 29.4* 29.8*  MCV 101.9* 101.0*  --   PLT 150 195  --     Cardiac Enzymes: No results for input(s): CKTOTAL, CKMB, CKMBINDEX, TROPONINI in the last 168 hours. BNP (last  3 results) No results for input(s): PROBNP in the last 8760 hours. CBG: No results for input(s): GLUCAP in the last 168 hours. D-Dimer: No results for input(s): DDIMER in the last 72 hours. Hgb A1c: No results for input(s): HGBA1C in the last 72 hours. Lipid Profile: No results for input(s): CHOL, HDL, LDLCALC, TRIG, CHOLHDL, LDLDIRECT in the last 72 hours. Thyroid function studies: No results for input(s): TSH, T4TOTAL, T3FREE, THYROIDAB in the last 72 hours.  Invalid input(s): FREET3 Anemia work up: No results for input(s): VITAMINB12, FOLATE, FERRITIN, TIBC, IRON, RETICCTPCT in the last 72 hours.  Sepsis Labs: Recent Labs  Lab 02/22/22 0425 02/23/22 0315  WBC 10.8* 10.5    Microbiology No results found for this or any previous visit (from the past 240 hour(s)).    Medications:    bictegravir-emtricitabine-tenofovir AF  1 tablet Oral Daily   chlorhexidine  15 mL Mouth Rinse BID   Chlorhexidine Gluconate Cloth  6 each Topical Daily   clonazePAM  0.5 mg Oral BID   diclofenac Sodium  4 g Topical QID   famotidine  40 mg Oral Daily   feeding supplement  237 mL Oral BID BM   FLUoxetine  40 mg Oral Daily   folic acid  1 mg Oral Daily   haloperidol  5 mg Oral Daily   lactulose  20 g Oral BID   linaclotide  145 mcg Oral QAC breakfast   lurasidone  120 mg Oral Daily   mouth rinse  15 mL Mouth Rinse q12n4p   mouth rinse  15 mL Mouth Rinse q12n4p   midodrine  5 mg Oral TID WC   multivitamin with minerals  1 tablet Oral Daily   nicotine  21 mg Transdermal Daily   pantoprazole  40 mg Oral Daily   sodium chloride flush  10-40 mL Intracatheter Q12H   thiamine  100 mg Oral Daily   Or   thiamine  100 mg Intravenous Daily   valACYclovir  1,000  mg Oral Daily   Continuous Infusions:  sodium chloride 250 mL (02/20/22 0509)      LOS: 15 days   Charlynne Cousins  [facility]  02/28/2022, 8:49 AM

## 2022-02-28 NOTE — Progress Notes (Signed)
Daily Progress Note   Patient Name: [name]       Date: 02/28/2022 DOB: 1986/01/24  Age: 36 y.o. MRN#: [id number] Attending Physician: Charlynne Cousins, MD Primary Care Physician: [name], MD Admit Date: 02/13/2022  Reason for Consultation/Follow-up: Establishing goals of care  Subjective: Awake and alert sitting in bedside chair.  Denies needs.  Discussed plan for rehab and he stated, "OK."  He then asked for a Coke which his RN said she will get as he requires thickener.  Length of Stay: 15  Current Medications: Scheduled Meds:   bictegravir-emtricitabine-tenofovir AF  1 tablet Oral Daily   chlorhexidine  15 mL Mouth Rinse BID   Chlorhexidine Gluconate Cloth  6 each Topical Daily   clonazePAM  0.5 mg Oral BID   diclofenac Sodium  4 g Topical QID   famotidine  40 mg Oral Daily   feeding supplement  237 mL Oral BID BM   FLUoxetine  40 mg Oral Daily   folic acid  1 mg Oral Daily   haloperidol  5 mg Oral Daily   lactulose  20 g Oral BID   linaclotide  145 mcg Oral QAC breakfast   lurasidone  120 mg Oral Daily   magnesium oxide  400 mg Oral BID   mouth rinse  15 mL Mouth Rinse q12n4p   mouth rinse  15 mL Mouth Rinse q12n4p   midodrine  5 mg Oral TID WC   multivitamin with minerals  1 tablet Oral Daily   nicotine  21 mg Transdermal Daily   pantoprazole  40 mg Oral Daily   sodium chloride flush  10-40 mL Intracatheter Q12H   thiamine  100 mg Oral Daily   Or   thiamine  100 mg Intravenous Daily   valACYclovir  1,000 mg Oral Daily    Continuous Infusions:  sodium chloride 250 mL (02/20/22 0509)    PRN Meds: acetaminophen, albuterol, diphenoxylate-atropine, lip balm, sodium chloride flush, trazodone  Physical Exam         Awake alert Doesn't verbalize  much No distress No edema Regular work of breathing  Vital Signs: BP 113/81 (BP Location: Left Arm)   Pulse 93   Temp 98.1 F (36.7 C)   Resp 16   Ht 6' 3"  (1.905 m)   Wt 86 kg  SpO2 100%   BMI 23.70 kg/m  SpO2: SpO2: 100 % O2 Device: O2 Device: Room Air O2 Flow Rate: O2 Flow Rate (L/min): 3 L/min  Intake/output summary:  Intake/Output Summary (Last 24 hours) at 02/28/2022 1417 Last data filed at 02/28/2022 1329 Gross per 24 hour  Intake 1076 ml  Output 2150 ml  Net -1074 ml    LBM: Last BM Date : 02/26/22 Baseline Weight: Weight: 86 kg Most recent weight: Weight: 86 kg       Palliative Assessment/Data:      Patient Active Problem List   Diagnosis Date Noted   Suicidal ideation 01/12/2014   Protein-calorie malnutrition, severe ([place]) 10/09/2021   Noncompliance with medications 02/20/2022   Hyperphosphatemia 08/65/7846   Alcoholic cirrhosis of liver ([place]) 02/17/2022   Chronic alcohol use 02/17/2022   Bipolar disorder ([place]) 02/17/2022   Sepsis ([place]) 02/13/2022   Malnutrition of moderate degree 10/20/2021   Palliative care by specialist    DNR (do not resuscitate) 10/14/2021   Aspiration pneumonia ([place]) 10/14/2021   FTT (failure to thrive) in adult 10/14/2021   Dysphagia 10/14/2021   Coagulopathy ([place]) 10/14/2021   Avascular necrosis of femoral head, left ([place]) 96/29/5284   Alcoholic cirrhosis of liver without ascites ([place]) 10/07/2021   Cellulitis of multiple sites of buttock 10/07/2021   Hypotension    Hypoxia 08/24/2021   Left hip pain    Transaminitis    Acute metabolic encephalopathy 13/24/4010   Cellulitis 05/07/2021   Lymphadenopathy 09/29/2020   Anemia    Upper urinary tract infection    Sepsis secondary to UTI ([place]) 03/14/2020   Cellulitis of groin 03/14/2020   HIV disease ([place])    Macrocytic anemia    Thrombocytopenia ([facility])    Prolonged QT interval    Acute respiratory failure due to COVID-19 ([place]) 10/27/2019   GERD (gastroesophageal  reflux disease) 10/27/2019   Hypokalemia 10/27/2019   Peripheral neuropathy 10/01/2019   PTSD (post-traumatic stress disorder) 07/23/2018   Dizziness and giddiness 02/01/2016   Migraine headache 02/01/2016   Schizoaffective disorder, depressive type ([place]) 05/13/2015   Severe alcohol dependence ([place]) 05/13/2015    Palliative Care Assessment & Plan   Patient Profile:    Assessment:  36 year old BM PMHx HIV, bipolar disorder, depression, PTSD, schizoaffective disorder, seizures.  Severe sepsis with septic shock/Bilateral buttocks wounds due to hidradenitis Severe protein calorie malnutrition Chronic EtOH use/ETOH Liver cirrhosis  Recommendations/Plan: Agree with DNR Recommend SNF rehab with palliative.  Awaiting placement at this point.  As no identified acute palliative needs, will sign off.  Please call or reconsult if there are palliative specific needs with which we can be of assistance.   Code Status:    Code Status Orders  (From admission, onward)           Start     Ordered   02/19/22 1609  Do not attempt resuscitation (DNR)  Continuous       Question Answer Comment  In the event of cardiac or respiratory ARREST Do not call a "code blue"   In the event of cardiac or respiratory ARREST Do not perform Intubation, CPR, defibrillation or ACLS   In the event of cardiac or respiratory ARREST Use medication by any route, position, wound care, and other measures to relive pain and suffering. May use oxygen, suction and manual treatment of airway obstruction as needed for comfort.      02/19/22 1608           Code Status History  Date Active Date Inactive Code Status Order ID Comments User Context   02/13/2022 2142 02/19/2022 1608 Full Code [id number]  [name], MD Inpatient   10/14/2021 1207 11/04/2021 1705 DNR [id number]  [name], NP Inpatient   10/07/2021 0228 10/14/2021 1206 Full Code [id number]  [name], MD ED   08/24/2021 0200 09/01/2021 0258  Full Code [id number]  [name], MD ED   05/07/2021 1912 05/19/2021 1841 Full Code [id number]  [name], MD ED   03/15/2020 0606 03/20/2020 1650 Full Code [id number]  [name], MD ED   10/27/2019 1815 11/02/2019 1902 Full Code [id number]  [name], MD Inpatient   07/22/2018 1722 07/22/2018 2158 Full Code [id number]  [name], NP Inpatient   07/22/2018 0832 07/22/2018 1703 Full Code [id number]  [name], MD ED   05/12/2015 1753 05/15/2015 1723 Full Code [id number]  [name], NP Inpatient   05/12/2015 0546 05/12/2015 1753 Full Code [id number]  [name], MD ED   01/12/2014 0256 01/12/2014 1650 Full Code [id number]  [name], MD ED       Prognosis:  Unable to determine  Discharge Planning: [facility] for rehab with Palliative care service follow-up  Care plan was discussed with  patient  Thank you for allowing the Palliative Medicine Team to assist in the care of this patient.  Micheline Rough, MD  Please contact Palliative Medicine Team phone at [phone] for questions and concerns.

## 2022-03-01 DIAGNOSIS — A419 Sepsis, unspecified organism: Secondary | ICD-10-CM | POA: Diagnosis not present

## 2022-03-01 LAB — MAGNESIUM: Magnesium: 1.6 mg/dL — ABNORMAL LOW (ref 1.7–2.4)

## 2022-03-01 LAB — PHOSPHORUS: Phosphorus: 3.9 mg/dL (ref 2.5–4.6)

## 2022-03-01 MED ORDER — MAGNESIUM OXIDE -MG SUPPLEMENT 400 (240 MG) MG PO TABS
400.0000 mg | ORAL_TABLET | Freq: Two times a day (BID) | ORAL | Status: DC
  Administered 2022-03-01 – 2022-03-03 (×5): 400 mg via ORAL
  Filled 2022-03-01 (×6): qty 1

## 2022-03-01 MED ORDER — MAGNESIUM SULFATE 2 GM/50ML IV SOLN
2.0000 g | Freq: Once | INTRAVENOUS | Status: AC
Start: 2022-03-01 — End: 2022-03-01
  Administered 2022-03-01: 2 g via INTRAVENOUS
  Filled 2022-03-01: qty 50

## 2022-03-01 NOTE — Progress Notes (Signed)
[facility] PROGRESS NOTE    Progress Note  [name]  HYW:[id number] DOB: 03/02/1986 DOA: 02/13/2022 PCP: [name], MD     Brief Narrative:   [name] is an 36 y.o. male past medical history of HIV, bipolar disorder depression PTSD and schizoaffective disorder along with seizures comes into the EMS as he was found by home health on the floor found to be altered and somnolent unable to provide history.  He had an apparent fall unwitnessed from the bed, his home health aide called EMS patient was found briefly hypotensive and hypothermic in the ED.  Patient was found to have multiple medications and alcohol sprout throughout the room.  CT scan of the abdomen pelvis showed fractures.  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  Awaiting placement to skilled nursing facility.  Assessment/Plan:   Severe Sepsis ([place]) possibly due to bilateral buttock wounds and hidradenitis suppurativa: Wean off steroids. Patient completed course of IV Rocephin and doxycycline on 02/20/2022. Vitals are currently stable. Physical therapy evaluated the patient recommended skilled nursing facility awaiting placement.  Acute metabolic encephalopathy: Likely due to infectious etiology.  Now resolved Physical therapy evaluated the patient recommended rehab.  HIV: Continue current regimen.  Hypotension: We will started on stress dose, has now been weaned off. Continue to titrate down midodrine.  Prolonged QTc: Try to keep potassium greater than 4 magnesium greater than 2. Magnesium is low today replete orally recheck tomorrow morning.  Severe protein caloric malnutrition: Counseling.  Continue Ensure 3 times daily. Speech evaluated the patient recommended dysphagia 3 diet.  History of bipolar disorder/schizoaffective disorder: Continue Klonopin, trazodone, Latuda and  Haldol as needed.  Normocytic anemia: With a hemoglobin of 7.7, MCV of 100 check a B12 and a folate level. He is status post 1 unit of packed red blood cells on 02/19/2022.  Thrombocytosis: Likely reactive HIT panel negative.  Chronic alcohol abuse/liver cirrhosis alcoholic: No signs of withdrawal continue lactulose 3 times daily.  Hypokalemia: Replete orally try to keep greater than 4.  Hypophosphatemia: Try to keep phosphorus greater than 3.  Hypomagnesemia Magnesium is 1.8.  Noncompliance with medication. Patient continues to refuse his medication the previous doctor had a long conversation with the patient, and counseling about the failure to comply with medication could result in death at that time the patient relates he understood. On 02/20/2022 the patient is still on comparative refuses to speak this morning.   Unstageable pressure ulcer present on buttock on admission RN Pressure Injury Documentation: Pressure Injury 02/13/22 Buttocks Bilateral (Active)  02/13/22 2030  Location: Buttocks  Location Orientation: Bilateral  Staging:   Wound Description (Comments):   Present on Admission: Yes  Dressing Type Foam - Lift dressing to assess site every shift 02/28/22 2145     DVT prophylaxis: lovenox Family Communication:none Status is: Inpatient Remains inpatient appropriate because: Severe sepsis    Code Status:     Code Status Orders  (From admission, onward)           Start     Ordered   02/19/22 1609  Do not attempt resuscitation (DNR)  Continuous       Question Answer Comment  In the event of cardiac or respiratory ARREST Do not call a "code blue"   In the event of cardiac or respiratory ARREST Do not perform Intubation, CPR, defibrillation or ACLS   In the  event of cardiac or respiratory ARREST Use medication by any route, position, wound care, and other measures to relive pain and suffering. May use oxygen, suction and manual treatment of airway  obstruction as needed for comfort.      02/19/22 1608           Code Status History     Date Active Date Inactive Code Status Order ID Comments User Context   02/13/2022 2142 02/19/2022 1608 Full Code [id number]  [name], MD Inpatient   10/14/2021 1207 11/04/2021 1705 DNR [id number]  [name], NP Inpatient   10/07/2021 0228 10/14/2021 1206 Full Code [id number]  [name], MD ED   08/24/2021 0200 09/01/2021 0258 Full Code [id number]  [name], MD ED   05/07/2021 1912 05/19/2021 1841 Full Code [id number]  [name], MD ED   03/15/2020 0606 03/20/2020 1650 Full Code [id number]  [name], MD ED   10/27/2019 1815 11/02/2019 1902 Full Code [id number]  [name], MD Inpatient   07/22/2018 1722 07/22/2018 2158 Full Code [id number]  [name], NP Inpatient   07/22/2018 0832 07/22/2018 1703 Full Code [id number]  [name], MD ED   05/12/2015 1753 05/15/2015 1723 Full Code [id number]  [name], NP Inpatient   05/12/2015 0546 05/12/2015 1753 Full Code [id number]  [name], MD ED   01/12/2014 0256 01/12/2014 1650 Full Code [id number]  [name], MD ED         IV Access:   Peripheral IV   Procedures and diagnostic studies:   No results found.   Medical Consultants:   None.   Subjective:    [name] no complaints no events overnight. Objective:    Vitals:   02/28/22 1304 02/28/22 2009 03/01/22 0641 03/01/22 0658  BP: 113/81 110/72 (!) 88/55 101/60  Pulse: 93 98 94 91  Resp: 16 18 17    Temp: 98.1 F (36.7 C) 99.2 F (37.3 C) 98.4 F (36.9 C)   TempSrc:  Oral Oral   SpO2: 100% 100% 97%   Weight:      Height:       SpO2: 97 % O2 Flow Rate (L/min): 3 L/min   Intake/Output Summary (Last 24 hours) at 03/01/2022 0802 Last data filed at 02/28/2022 1756 Gross per 24 hour  Intake 1076 ml  Output 750 ml  Net 326 ml   Filed Weights   02/13/22 1521  Weight: 86 kg    Exam: General exam: In no acute  distress. Respiratory system: Good air movement and clear to auscultation. Cardiovascular system: S1 & S2 heard, RRR. No JVD. Gastrointestinal system: Abdomen is nondistended, soft and nontender.  Extremities: No pedal edema. Skin: No rashes, lesions or ulcers Psychiatry: Judgement and insight appear normal. Mood & affect appropriate.    Data Reviewed:    Labs: Basic Metabolic Panel: Recent Labs  Lab 02/23/22 0315 02/24/22 0408 02/25/22 0413 02/26/22 0356 02/27/22 0342 02/28/22 0320 03/01/22 0434  NA 140 145  --   --   --   --   --   K 3.3* 3.2*  --   --   --   --   --   CL 109 114*  --   --   --   --   --   CO2 22 26  --   --   --   --   --   GLUCOSE 197* 117*  --   --   --   --   --  BUN 10 10  --   --   --   --   --   CREATININE 0.90 0.80  --   --   --   --   --   CALCIUM 8.2* 8.4*  --   --   --   --   --   MG 1.8 2.1 2.0 2.0 1.7 1.7 1.6*  PHOS 3.0 3.7 3.0 3.3 3.0 3.6 3.9   GFR Estimated Creatinine Clearance: 149.6 mL/min (by C-G formula based on SCr of 0.8 mg/dL). Liver Function Tests: Recent Labs  Lab 02/23/22 0315  AST 17  ALT 13  ALKPHOS 86  BILITOT 0.8  PROT 6.9  ALBUMIN 2.3*   No results for input(s): LIPASE, AMYLASE in the last 168 hours. No results for input(s): AMMONIA in the last 168 hours. Coagulation profile No results for input(s): INR, PROTIME in the last 168 hours. COVID-19 Labs  No results for input(s): DDIMER, FERRITIN, LDH, CRP in the last 72 hours.  Lab Results  Component Value Date   SARSCOV2NAA NEGATIVE 02/13/2022   SARSCOV2NAA NEGATIVE 10/07/2021   SARSCOV2NAA NEGATIVE 08/23/2021   [place] NEGATIVE 05/07/2021    CBC: Recent Labs  Lab 02/23/22 0315 02/23/22 1239  WBC 10.5  --   NEUTROABS 9.0*  --   HGB 9.1*  --   HCT 29.4* 29.8*  MCV 101.0*  --   PLT 195  --    Cardiac Enzymes: No results for input(s): CKTOTAL, CKMB, CKMBINDEX, TROPONINI in the last 168 hours. BNP (last 3 results) No results for input(s):  PROBNP in the last 8760 hours. CBG: No results for input(s): GLUCAP in the last 168 hours. D-Dimer: No results for input(s): DDIMER in the last 72 hours. Hgb A1c: No results for input(s): HGBA1C in the last 72 hours. Lipid Profile: No results for input(s): CHOL, HDL, LDLCALC, TRIG, CHOLHDL, LDLDIRECT in the last 72 hours. Thyroid function studies: No results for input(s): TSH, T4TOTAL, T3FREE, THYROIDAB in the last 72 hours.  Invalid input(s): FREET3 Anemia work up: No results for input(s): VITAMINB12, FOLATE, FERRITIN, TIBC, IRON, RETICCTPCT in the last 72 hours.  Sepsis Labs: Recent Labs  Lab 02/23/22 0315  WBC 10.5   Microbiology No results found for this or any previous visit (from the past 240 hour(s)).    Medications:    bictegravir-emtricitabine-tenofovir AF  1 tablet Oral Daily   chlorhexidine  15 mL Mouth Rinse BID   Chlorhexidine Gluconate Cloth  6 each Topical Daily   clonazePAM  0.5 mg Oral BID   diclofenac Sodium  4 g Topical QID   famotidine  40 mg Oral Daily   feeding supplement  237 mL Oral BID BM   FLUoxetine  40 mg Oral Daily   folic acid  1 mg Oral Daily   haloperidol  5 mg Oral Daily   lactulose  20 g Oral BID   linaclotide  145 mcg Oral QAC breakfast   lurasidone  120 mg Oral Daily   mouth rinse  15 mL Mouth Rinse q12n4p   mouth rinse  15 mL Mouth Rinse q12n4p   midodrine  5 mg Oral TID WC   multivitamin with minerals  1 tablet Oral Daily   nicotine  21 mg Transdermal Daily   pantoprazole  40 mg Oral Daily   sodium chloride flush  10-40 mL Intracatheter Q12H   thiamine  100 mg Oral Daily   Or   thiamine  100 mg Intravenous Daily   valACYclovir  1,000  mg Oral Daily   Continuous Infusions:  sodium chloride 250 mL (02/20/22 0509)      LOS: 16 days   Charlynne Cousins  [facility]  03/01/2022, 8:02 AM

## 2022-03-01 NOTE — Progress Notes (Signed)
Patient would like to wait to have dressing change later in the morning.

## 2022-03-02 DIAGNOSIS — R627 Adult failure to thrive: Secondary | ICD-10-CM | POA: Diagnosis not present

## 2022-03-02 LAB — MAGNESIUM: Magnesium: 1.8 mg/dL (ref 1.7–2.4)

## 2022-03-02 LAB — PHOSPHORUS: Phosphorus: 3.3 mg/dL (ref 2.5–4.6)

## 2022-03-02 MED ORDER — LINACLOTIDE 145 MCG PO CAPS
145.0000 ug | ORAL_CAPSULE | Freq: Every day | ORAL | 0 refills | Status: DC
  Filled 2022-03-02: qty 60, 60d supply, fill #0

## 2022-03-02 NOTE — Progress Notes (Signed)
[facility] PROGRESS NOTE    Progress Note  [name]  JOA:[id number] DOB: 01-01-1986 DOA: 02/13/2022 PCP: [name], MD     Brief Narrative:   [name] is an 36 y.o. male past medical history of HIV, bipolar disorder depression PTSD and schizoaffective disorder along with seizures comes into the EMS as he was found by home health on the floor found to be altered and somnolent unable to provide history.  He had an apparent fall unwitnessed from the bed, his home health aide called EMS patient was found briefly hypotensive and hypothermic in the ED.  Patient was found to have multiple medications and alcohol sprout throughout the room.  CT scan of the abdomen pelvis showed fractures.  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  Awaiting placement to skilled nursing facility.  Assessment/Plan:   Severe Sepsis ([place]) possibly due to bilateral buttock wounds and hidradenitis suppurativa: Wean off steroids. Patient completed course of IV Rocephin and doxycycline on 02/20/2022. Vitals are currently stable. Physical therapy evaluated the patient recommended skilled nursing facility awaiting placement.  Acute metabolic encephalopathy: Likely due to infectious etiology.  Now resolved Physical therapy evaluated the patient recommended rehab.  HIV: Continue current regimen.  Hypotension: We will started on stress dose, has now been weaned off. Continue to titrate down midodrine.  Prolonged QTc: Try to keep potassium greater than 4 magnesium greater than 2. Magnesium is low today replete orally recheck tomorrow morning.  Severe protein caloric malnutrition: Counseling.  Continue Ensure 3 times daily. Speech evaluated the patient recommended dysphagia 3 diet.  History of bipolar disorder/schizoaffective disorder: Continue Klonopin, trazodone, Latuda and  Haldol as needed.  Normocytic anemia: With a hemoglobin of 7.7, MCV of 100 check a B12 and a folate level. He is status post 1 unit of packed red blood cells on 02/19/2022.  Thrombocytosis: Likely reactive HIT panel negative.  Chronic alcohol abuse/liver cirrhosis alcoholic: No signs of withdrawal continue lactulose 3 times daily.  Hypokalemia: Replete orally try to keep greater than 4.  Hypophosphatemia: Try to keep phosphorus greater than 3.  Hypomagnesemia Magnesium is 1.8.  Noncompliance with medication. Patient continues to refuse his medication the previous doctor had a long conversation with the patient, and counseling about the failure to comply with medication could result in death at that time the patient relates he understood. On 02/20/2022 the patient is still on comparative refuses to speak this morning.   Unstageable pressure ulcer present on buttock on admission RN Pressure Injury Documentation: Pressure Injury 02/13/22 Buttocks Bilateral (Active)  02/13/22 2030  Location: Buttocks  Location Orientation: Bilateral  Staging:   Wound Description (Comments):   Present on Admission: Yes  Dressing Type Foam - Lift dressing to assess site every shift 03/01/22 2100     DVT prophylaxis: lovenox Family Communication:none Status is: Inpatient Remains inpatient appropriate because: Severe sepsis    Code Status:     Code Status Orders  (From admission, onward)           Start     Ordered   02/19/22 1609  Do not attempt resuscitation (DNR)  Continuous       Question Answer Comment  In the event of cardiac or respiratory ARREST Do not call a "code blue"   In the event of cardiac or respiratory ARREST Do not perform Intubation, CPR, defibrillation or ACLS   In the  event of cardiac or respiratory ARREST Use medication by any route, position, wound care, and other measures to relive pain and suffering. May use oxygen, suction and manual treatment of airway  obstruction as needed for comfort.      02/19/22 1608           Code Status History     Date Active Date Inactive Code Status Order ID Comments User Context   02/13/2022 2142 02/19/2022 1608 Full Code [id number]  [name], MD Inpatient   10/14/2021 1207 11/04/2021 1705 DNR [id number]  [name], NP Inpatient   10/07/2021 0228 10/14/2021 1206 Full Code [id number]  [name], MD ED   08/24/2021 0200 09/01/2021 0258 Full Code [id number]  [name], MD ED   05/07/2021 1912 05/19/2021 1841 Full Code [id number]  [name], MD ED   03/15/2020 0606 03/20/2020 1650 Full Code [id number]  [name], MD ED   10/27/2019 1815 11/02/2019 1902 Full Code [id number]  [name], MD Inpatient   07/22/2018 1722 07/22/2018 2158 Full Code [id number]  [name], NP Inpatient   07/22/2018 0832 07/22/2018 1703 Full Code [id number]  [name], MD ED   05/12/2015 1753 05/15/2015 1723 Full Code [id number]  [name], NP Inpatient   05/12/2015 0546 05/12/2015 1753 Full Code [id number]  [name], MD ED   01/12/2014 0256 01/12/2014 1650 Full Code [id number]  [name], MD ED         IV Access:   Peripheral IV   Procedures and diagnostic studies:   No results found.   Medical Consultants:   None.   Subjective:    [name] is aaox3 today, he remains is very weak, RN reports he need two assists to get out of bed Sacral /buttock wound is bleeding , he denies pain or itch He is cooperative today, he agreed snf placement Objective:    Vitals:   03/01/22 0658 03/01/22 1352 03/01/22 2055 03/02/22 0618  BP: 101/60 111/70 114/78 98/64  Pulse: 91 94 92 99  Resp:  17 18 18   Temp:  98.2 F (36.8 C) 98.6 F (37 C) 98.4 F (36.9 C)  TempSrc:  Oral Oral Oral  SpO2:  100% 100% 98%  Weight:      Height:       SpO2: 98 % O2 Flow Rate (L/min): 3 L/min   Intake/Output Summary (Last 24 hours) at 03/02/2022 1215 Last data filed at  03/02/2022 0908 Gross per 24 hour  Intake 1795 ml  Output 2000 ml  Net -205 ml   Filed Weights   02/13/22 1521  Weight: 86 kg    Exam: General exam: In no acute distress, appear weak Respiratory system: Good air movement and clear to auscultation. Cardiovascular system: S1 & S2 heard, RRR. No JVD. Gastrointestinal system: Abdomen is nondistended, soft and nontender.  Extremities: No pedal edema. Skin: see pic  Psychiatry: appear calm and cooperative today    Data Reviewed:    Labs: Basic Metabolic Panel: Recent Labs  Lab 02/24/22 0408 02/25/22 0413 02/26/22 5916 02/27/22 0342 02/28/22 0320 03/01/22 0434 03/02/22 0427  NA 145  --   --   --   --   --   --   K 3.2*  --   --   --   --   --   --   CL 114*  --   --   --   --   --   --  CO2 26  --   --   --   --   --   --   GLUCOSE 117*  --   --   --   --   --   --   BUN 10  --   --   --   --   --   --   CREATININE 0.80  --   --   --   --   --   --   CALCIUM 8.4*  --   --   --   --   --   --   MG 2.1   < > 2.0 1.7 1.7 1.6* 1.8  PHOS 3.7   < > 3.3 3.0 3.6 3.9 3.3   < > = values in this interval not displayed.   GFR Estimated Creatinine Clearance: 149.6 mL/min (by C-G formula based on SCr of 0.8 mg/dL). Liver Function Tests: No results for input(s): AST, ALT, ALKPHOS, BILITOT, PROT, ALBUMIN in the last 168 hours.  No results for input(s): LIPASE, AMYLASE in the last 168 hours. No results for input(s): AMMONIA in the last 168 hours. Coagulation profile No results for input(s): INR, PROTIME in the last 168 hours. COVID-19 Labs  No results for input(s): DDIMER, FERRITIN, LDH, CRP in the last 72 hours.  Lab Results  Component Value Date   SARSCOV2NAA NEGATIVE 02/13/2022   SARSCOV2NAA NEGATIVE 10/07/2021   SARSCOV2NAA NEGATIVE 08/23/2021   [place] NEGATIVE 05/07/2021    CBC: Recent Labs  Lab 02/23/22 1239  HCT 29.8*   Cardiac Enzymes: No results for input(s): CKTOTAL, CKMB, CKMBINDEX, TROPONINI in the  last 168 hours. BNP (last 3 results) No results for input(s): PROBNP in the last 8760 hours. CBG: No results for input(s): GLUCAP in the last 168 hours. D-Dimer: No results for input(s): DDIMER in the last 72 hours. Hgb A1c: No results for input(s): HGBA1C in the last 72 hours. Lipid Profile: No results for input(s): CHOL, HDL, LDLCALC, TRIG, CHOLHDL, LDLDIRECT in the last 72 hours. Thyroid function studies: No results for input(s): TSH, T4TOTAL, T3FREE, THYROIDAB in the last 72 hours.  Invalid input(s): FREET3 Anemia work up: No results for input(s): VITAMINB12, FOLATE, FERRITIN, TIBC, IRON, RETICCTPCT in the last 72 hours.  Sepsis Labs: No results for input(s): PROCALCITON, WBC, LATICACIDVEN in the last 168 hours.  Microbiology No results found for this or any previous visit (from the past 240 hour(s)).    Medications:    bictegravir-emtricitabine-tenofovir AF  1 tablet Oral Daily   chlorhexidine  15 mL Mouth Rinse BID   Chlorhexidine Gluconate Cloth  6 each Topical Daily   clonazePAM  0.5 mg Oral BID   diclofenac Sodium  4 g Topical QID   famotidine  40 mg Oral Daily   feeding supplement  237 mL Oral BID BM   FLUoxetine  40 mg Oral Daily   folic acid  1 mg Oral Daily   haloperidol  5 mg Oral Daily   lactulose  20 g Oral BID   linaclotide  145 mcg Oral QAC breakfast   lurasidone  120 mg Oral Daily   magnesium oxide  400 mg Oral BID   mouth rinse  15 mL Mouth Rinse q12n4p   mouth rinse  15 mL Mouth Rinse q12n4p   midodrine  5 mg Oral TID WC   multivitamin with minerals  1 tablet Oral Daily   nicotine  21 mg Transdermal Daily   pantoprazole  40 mg Oral Daily  sodium chloride flush  10-40 mL Intracatheter Q12H   thiamine  100 mg Oral Daily   Or   thiamine  100 mg Intravenous Daily   valACYclovir  1,000 mg Oral Daily   Continuous Infusions:  sodium chloride 250 mL (02/20/22 0509)      LOS: 17 days   Florencia Reasons MD PhD FACP [facility]  03/02/2022,  12:15 PM

## 2022-03-02 NOTE — Progress Notes (Signed)
Nutrition Follow-up  INTERVENTION:   -Ensure Plus High Protein po BID, each supplement provides 350 kcal and 20 grams of protein.   NUTRITION DIAGNOSIS:   Increased nutrient needs related to wound healing, chronic illness as evidenced by estimated needs.  Ongoing.  GOAL:   Patient will meet greater than or equal to 90% of their needs  Progressing.  MONITOR:   PO intake, Supplement acceptance, Labs, Weight trends, I & O's  ASSESSMENT:   36 y.o. male past medical history of HIV, bipolar disorder depression PTSD and schizoaffective disorder along with seizures comes into the EMS as he was found by home health on the floor found to be altered and somnolent unable to provide history. Admitted for severe sepsis and acute metabolic encephalopathy.  Patient currently consuming 100% of meals.  SLP outside of room reports wanting to liberalize pt's diet and to allow nectar thick liquids in anticipation of discharge to SNF.  Last accepted an Ensure on 5/22.  Admission weight: 189 lbs  Medications: Pepcid, Folic acid, Lactulose, MAG-OX, Multivitamin with minerals daily, Thiamine  Labs reviewed.  Diet Order:   Diet Order             Diet regular Room service appropriate? No; Fluid consistency: Nectar Thick  Diet effective now                   EDUCATION NEEDS:   No education needs have been identified at this time  Skin:  Skin Assessment: Skin Integrity Issues: Skin Integrity Issues:: Unstageable Unstageable: bilateral buttocks  Last BM:  5/23 -type 6  Height:   Ht Readings from Last 1 Encounters:  02/13/22 6' 3"  (1.905 m)    Weight:   Wt Readings from Last 1 Encounters:  02/13/22 86 kg    BMI:  Body mass index is 23.7 kg/m.  Estimated Nutritional Needs:   Kcal:  2300-2500  Protein:  105-115g  Fluid:  2.3L/day  [name] Bibles, MS, RD, LDN Inpatient Clinical Dietitian Contact information available via Am[employer]

## 2022-03-02 NOTE — Progress Notes (Signed)
Speech Language Pathology Treatment: Dysphagia  Patient Details Name: [name] MRN: [id number] DOB: 06-May-1986 Today's Date: 03/02/2022 Time: 4627-0350 SLP Time Calculation (min) (ACUTE ONLY): 30 min  Assessment / Plan / Recommendation Clinical Impression  Skilled SLP intervention indicated to address dysphagia goals. Pt desires diet advancement to regular solids.  Baseline gurgly voice noted - which cleared with instructions to clear throat and swallow.  Pt advised he has been consuming thin liquids - reviewed MBS from 02/14/2022 with pt using teach back for precautions.  Clinical observations with nectar thick soda and water as well as thin water conducted. Pt consumed approximately 3 ounces of thin water sequential followed by cough within 10 seconds - concerning for aspiration.  No indication of aspiration or significant dysphagia with nectar liquids x9 ounces with small sips.  Given pt audibly aspirated on prior MBS 02/2022, - recommend to advance diet to regular/nectar liquids and allow thin water between meals after oral care.  Using teach back, pt reiterated precautions with min question cues.   Patient has chronic dysphagia but is motivated to have dietary advancement and given plan for DC is SNF - recommend minimize dietary restirctions and maximize nutrition/hydration abilities.  Text messaged MD with recommendations/requests and provided pt with swallow precaution sign.  Will follow up for tolerance of advancement of diete and indication of instrumental evaluation.  Pt and RN informed and agreeable to plan.    HPI HPI: Patient is a [name] who is well known to SLP secondary to frequent admissions and chronic dysphagia. He has PMH: HIV, bipolar disorder, PTSD, schizoaffective disorder, seizures. He presented to [facility] ED via EMS after being found on the floor at home by home health aide. Apparently, patient had an unwittnessed fall out of bed. When EMS arrived, they noted that patient had  multiple medication and alcohol bottles spread around at home.  In ED, patient was briefly hypotensive with BP in 60's, CT chest/abdomen/pelvis showed no acute findings. Patient admitted with severe sepsis and was hypotensive, hypothermic, possible hypovolemia with poor PO intake; UA showed  trace leukocytes, positive nitrates. Patient with unstageable wounds on both of his buttocks and sacrum. Pt seen by SLP with MBS (total of 5 now) recommending puree and honey thick liquids due to severe pharyngeal dysphagia. Pt able to upgrade to mechanical soft and on 5/17 SLP signed off as pt was self feeding and tolerating diet as well as expected given chronicity and severity of dysphagia. On 5/18 SLP was reordered to further cognitive decline and decreased ability to take PO in the setting of many days of refusal/inability to take medication.  Per notes, plan is for pt to dc to SNF - and SLP wishes to maximize nutrition/hydration prior to dc.      SLP Plan  Continue with current plan of care      Recommendations for follow up therapy are one component of a multi-disciplinary discharge planning process, led by the attending physician.  Recommendations may be updated based on patient status, additional functional criteria and insurance authorization.    Recommendations  Diet recommendations: Regular;Nectar-thick liquid (thin water ok between meals) Liquids provided via: Cup;No straw Medication Administration: Other (Comment) (with puree) Supervision: Intermittent supervision to cue for compensatory strategies;Patient able to self feed Compensations: Slow rate;Small sips/bites;Minimize environmental distractions Postural Changes and/or Swallow Maneuvers: Seated upright 90 degrees;Upright 30-60 min after meal                Oral Care Recommendations: Oral care  BID Follow Up Recommendations: Other (comment) (TBD) Assistance recommended at discharge: Frequent or constant Supervision/Assistance SLP Visit  Diagnosis: Dysphagia, oropharyngeal phase (R13.12) Plan: Continue with current plan of care         Kathleen Lime, MS [place] Office [phone] Pager [phone]   [name]  03/02/2022, 11:34 AM

## 2022-03-02 NOTE — Consult Note (Signed)
WOC Nurse Consult Note: Remote consult performed as there is no WOC team member on this campus at this time.  Patient is refusing care to the buttocks at this time.  Patient is known to [facility] team.  Assessment is performed with the assistance of clinical staff and photo in chart.  will implement topical orders when/if patient agrees to care, bedside RN may perform.   Wounds are chronic in nature, recent sepsis.  There has been some thought that the wounds are hidradenitis, although not confirmed.  Reason for Consult: Scattered nonintact, round lesions to  buttocks, sacrum and posterior thighs.  Skin is discolored to this surrounding area.  Wound type:inflammatory, unclear etiology Pressure Injury POA: NA Measurement: scattered 1 cm round open areas with buttocks, sacrum and posterior thighs discolored/gray in appearance Wound bed: pale pink Drainage (amount, consistency, odor) minimal serosanguinous  Periwound:see above Dressing procedure/placement/frequency: Cleanse open areas to buttocks, posterior thighs with NS and pat dry.  Apply aquacel Ag to open areas.  Cover with silicone foam. Barrier cream to surrounding skin.  Change daily.  Will not follow at this time.  Please re-consult if needed.  Domenic Moras MSN, RN, FNP-BC CWON Wound, Ostomy, Continence Nurse Pager [phone]

## 2022-03-02 NOTE — Progress Notes (Signed)
Patient refused access to visualize wound or perform wound care.

## 2022-03-03 ENCOUNTER — Other Ambulatory Visit (HOSPITAL_COMMUNITY): Payer: Self-pay

## 2022-03-03 DIAGNOSIS — R627 Adult failure to thrive: Secondary | ICD-10-CM | POA: Diagnosis not present

## 2022-03-03 LAB — PHOSPHORUS: Phosphorus: 3.4 mg/dL (ref 2.5–4.6)

## 2022-03-03 LAB — MAGNESIUM: Magnesium: 1.8 mg/dL (ref 1.7–2.4)

## 2022-03-03 MED ORDER — BICTEGRAVIR-EMTRICITAB-TENOFOV 50-200-25 MG PO TABS
1.0000 | ORAL_TABLET | Freq: Every day | ORAL | 0 refills | Status: AC
  Filled 2022-03-03: qty 60, 60d supply, fill #0
  Filled 2022-03-04: qty 30, 30d supply, fill #0

## 2022-03-03 NOTE — Progress Notes (Signed)
[facility] PROGRESS NOTE    Progress Note  [name]  LOV:[id number] DOB: 08/29/1986 DOA: 02/13/2022 PCP: [name], MD     Brief Narrative:   [name] is an 36 y.o. male past medical history of HIV, bipolar disorder depression PTSD and schizoaffective disorder along with seizures comes into the EMS as he was found by home health on the floor found to be altered and somnolent unable to provide history.  He had an apparent fall unwitnessed from the bed, his home health aide called EMS patient was found briefly hypotensive and hypothermic in the ED.  Patient was found to have multiple medications and alcohol sprout throughout the room.  CT scan of the abdomen pelvis showed fractures.  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  Awaiting placement to skilled nursing facility.  Assessment/Plan:   Severe Sepsis ([place]) possibly due to bilateral buttock wounds and hidradenitis suppurativa: Wean off steroids. Patient completed course of IV Rocephin and doxycycline on 02/20/2022. Vitals are currently stable. Physical therapy evaluated the patient recommended skilled nursing facility awaiting placement.  Acute metabolic encephalopathy: Likely due to infectious etiology.  Now resolved Physical therapy evaluated the patient recommended rehab.  HIV: Continue current regimen.  Hypotension: We will started on stress dose, has now been weaned off. Continue to titrate down midodrine.  Prolonged QTc: Try to keep potassium greater than 4 magnesium greater than 2. Magnesium is low today replete orally recheck tomorrow morning.  Severe protein caloric malnutrition: Counseling.  Continue Ensure 3 times daily. Speech evaluated the patient recommended dysphagia 3 diet.  History of bipolar disorder/schizoaffective disorder: Continue Klonopin, trazodone, Latuda and  Haldol as needed.  Normocytic anemia: With a hemoglobin of 7.7, MCV of 100 check a B12 and a folate level. He is status post 1 unit of packed red blood cells on 02/19/2022.  Thrombocytosis: Likely reactive HIT panel negative.  Chronic alcohol abuse/liver cirrhosis alcoholic: No signs of withdrawal continue lactulose 3 times daily.  Hypokalemia: Replete orally try to keep greater than 4.  Hypophosphatemia: Try to keep phosphorus greater than 3.  Hypomagnesemia Magnesium is 1.8.  Noncompliance with medication. Patient continues to refuse his medication the previous doctor had a long conversation with the patient, and counseling about the failure to comply with medication could result in death at that time the patient relates he understood. On 02/20/2022 the patient is still on comparative refuses to speak this morning.   Unstageable pressure ulcer present on buttock on admission RN Pressure Injury Documentation: Pressure Injury 02/13/22 Buttocks Bilateral (Active)  02/13/22 2030  Location: Buttocks  Location Orientation: Bilateral  Staging:   Wound Description (Comments):   Present on Admission: Yes  Dressing Type Foam - Lift dressing to assess site every shift 03/02/22 2100     DVT prophylaxis: lovenox Family Communication:none Status is: Inpatient Remains inpatient appropriate because: Severe sepsis    Code Status:     Code Status Orders  (From admission, onward)           Start     Ordered   02/19/22 1609  Do not attempt resuscitation (DNR)  Continuous       Question Answer Comment  In the event of cardiac or respiratory ARREST Do not call a "code blue"   In the event of cardiac or respiratory ARREST Do not perform Intubation, CPR, defibrillation or ACLS   In the  event of cardiac or respiratory ARREST Use medication by any route, position, wound care, and other measures to relive pain and suffering. May use oxygen, suction and manual treatment of airway  obstruction as needed for comfort.      02/19/22 1608           Code Status History     Date Active Date Inactive Code Status Order ID Comments User Context   02/13/2022 2142 02/19/2022 1608 Full Code [id number]  [name], MD Inpatient   10/14/2021 1207 11/04/2021 1705 DNR [id number]  [name], NP Inpatient   10/07/2021 0228 10/14/2021 1206 Full Code [id number]  [name], MD ED   08/24/2021 0200 09/01/2021 0258 Full Code [id number]  [name], MD ED   05/07/2021 1912 05/19/2021 1841 Full Code [id number]  [name], MD ED   03/15/2020 0606 03/20/2020 1650 Full Code [id number]  [name], MD ED   10/27/2019 1815 11/02/2019 1902 Full Code [id number]  [name], MD Inpatient   07/22/2018 1722 07/22/2018 2158 Full Code [id number]  [name], NP Inpatient   07/22/2018 0832 07/22/2018 1703 Full Code [id number]  [name], MD ED   05/12/2015 1753 05/15/2015 1723 Full Code [id number]  [name], NP Inpatient   05/12/2015 0546 05/12/2015 1753 Full Code [id number]  [name], MD ED   01/12/2014 0256 01/12/2014 1650 Full Code [id number]  [name], MD ED         IV Access:   Peripheral IV   Procedures and diagnostic studies:   No results found.   Medical Consultants:   None.   Subjective:    [name] is aaox3 today, he remains is very weak, RN reports he need two assists to get out of bed Sacral /buttock wound is bleeding , he denies pain or itch Intermittently refusing care, refusing labs he agreed to snf placement on 5/24 when asked  Objective:    Vitals:   03/01/22 2055 03/02/22 0618 03/02/22 1339 03/02/22 2015  BP: 114/78 98/64 98/65  111/74  Pulse: 92 99 99 95  Resp: 18 18 14 20   Temp: 98.6 F (37 C) 98.4 F (36.9 C) (!) 97.5 F (36.4 C) 98 F (36.7 C)  TempSrc: Oral Oral    SpO2: 100% 98% 100% 100%  Weight:      Height:       SpO2: 100 % O2 Flow Rate (L/min): 3 L/min   Intake/Output  Summary (Last 24 hours) at 03/03/2022 1917 Last data filed at 03/03/2022 1300 Gross per 24 hour  Intake 1552 ml  Output 1450 ml  Net 102 ml   Filed Weights   02/13/22 1521  Weight: 86 kg    Exam: General exam: In no acute distress, appear weak Respiratory system: Good air movement and clear to auscultation. Cardiovascular system: S1 & S2 heard, RRR. No JVD. Gastrointestinal system: Abdomen is nondistended, soft and nontender.  Extremities: No pedal edema. Skin: see pic  Psychiatry: appear calm and cooperative today    Data Reviewed:    Labs: Basic Metabolic Panel: Recent Labs  Lab 02/27/22 0342 02/28/22 0320 03/01/22 0434 03/02/22 0427 03/03/22 0404  MG 1.7 1.7 1.6* 1.8 1.8  PHOS 3.0 3.6 3.9 3.3 3.4   GFR Estimated Creatinine Clearance: 149.6 mL/min (by C-G formula based on SCr of 0.8 mg/dL). Liver Function Tests: No results for input(s): AST, ALT, ALKPHOS, BILITOT, PROT, ALBUMIN in the last 168 hours.  No results for input(s): LIPASE, AMYLASE  in the last 168 hours. No results for input(s): AMMONIA in the last 168 hours. Coagulation profile No results for input(s): INR, PROTIME in the last 168 hours. COVID-19 Labs  No results for input(s): DDIMER, FERRITIN, LDH, CRP in the last 72 hours.  Lab Results  Component Value Date   SARSCOV2NAA NEGATIVE 02/13/2022   [place] NEGATIVE 10/07/2021   SARSCOV2NAA NEGATIVE 08/23/2021   [place] NEGATIVE 05/07/2021    CBC: No results for input(s): WBC, NEUTROABS, HGB, HCT, MCV, PLT in the last 168 hours.  Cardiac Enzymes: No results for input(s): CKTOTAL, CKMB, CKMBINDEX, TROPONINI in the last 168 hours. BNP (last 3 results) No results for input(s): PROBNP in the last 8760 hours. CBG: No results for input(s): GLUCAP in the last 168 hours. D-Dimer: No results for input(s): DDIMER in the last 72 hours. Hgb A1c: No results for input(s): HGBA1C in the last 72 hours. Lipid Profile: No results for input(s):  CHOL, HDL, LDLCALC, TRIG, CHOLHDL, LDLDIRECT in the last 72 hours. Thyroid function studies: No results for input(s): TSH, T4TOTAL, T3FREE, THYROIDAB in the last 72 hours.  Invalid input(s): FREET3 Anemia work up: No results for input(s): VITAMINB12, FOLATE, FERRITIN, TIBC, IRON, RETICCTPCT in the last 72 hours.  Sepsis Labs: No results for input(s): PROCALCITON, WBC, LATICACIDVEN in the last 168 hours.  Microbiology No results found for this or any previous visit (from the past 240 hour(s)).    Medications:    bictegravir-emtricitabine-tenofovir AF  1 tablet Oral Daily   chlorhexidine  15 mL Mouth Rinse BID   Chlorhexidine Gluconate Cloth  6 each Topical Daily   clonazePAM  0.5 mg Oral BID   diclofenac Sodium  4 g Topical QID   famotidine  40 mg Oral Daily   feeding supplement  237 mL Oral BID BM   FLUoxetine  40 mg Oral Daily   folic acid  1 mg Oral Daily   haloperidol  5 mg Oral Daily   lactulose  20 g Oral BID   linaclotide  145 mcg Oral QAC breakfast   lurasidone  120 mg Oral Daily   magnesium oxide  400 mg Oral BID   mouth rinse  15 mL Mouth Rinse q12n4p   mouth rinse  15 mL Mouth Rinse q12n4p   midodrine  5 mg Oral TID WC   multivitamin with minerals  1 tablet Oral Daily   nicotine  21 mg Transdermal Daily   pantoprazole  40 mg Oral Daily   sodium chloride flush  10-40 mL Intracatheter Q12H   thiamine  100 mg Oral Daily   Or   thiamine  100 mg Intravenous Daily   valACYclovir  1,000 mg Oral Daily   Continuous Infusions:  sodium chloride 250 mL (02/20/22 0509)      LOS: 18 days   Florencia Reasons MD PhD FACP [facility]  03/03/2022, 7:17 PM

## 2022-03-03 NOTE — Progress Notes (Signed)
Speech Language Pathology Treatment: Dysphagia  Patient Details Name: [name] MRN: [id number] DOB: 04-29-1986 Today's Date: 03/03/2022 Time: 0141-0301 SLP Time Calculation (min) (ACUTE ONLY): 30 min  Assessment / Plan / Recommendation Clinical Impression  SLP intervention to assess tolerance of dietary advancement.  Pt with nectar soda in room and thin water - He reports no coughing with po intake and that he is content with diet advancement.  Observed pt with both thin and nectar liquids - no coughing across all boluses and improved efficiency of swallow.  Voice is also clear today - not gurgly as observed during prior sessions.   Pt states he is not drinking *during session) because he feels that he needs to urinate and cannot and he appears with edematous legs - bilateral.  Per RN, he has consumed 8 drinks today - in addition to liquids at meals.  Urine appears dark.    Recommend continue diet of regular/nectar and free water protocol between meals.  SLP will follow up once more given pt's diet advancement and to provide pt with RMST to improve strength of cough/phonation/expectoration ability. Whereas, do not anticipate improvement in swallow function- improving cough/phonation - can aid airway protection with suspected episodic aspiration.     Recommend consider Incentive Spriometer use for pt - before meals-.  Pt did not verbalize correct precautions - use of nectar liquids with meals and thin water between meals - but program is in place and liquids according to diet orders are sent to room.   Pt does like sodas - and can tolerate then thickened without displeasure per his report.  Pt making great progress in tolerating diet advancement.    HPI HPI: Patient is a 35 y.o. male who is well known to SLP secondary to frequent admissions and chronic dysphagia. He has PMH: HIV, bipolar disorder, PTSD, schizoaffective disorder, seizures. He presented to [facility] ED via EMS after being found on  the floor at home by home health aide. Apparently, patient had an unwittnessed fall out of bed. When EMS arrived, they noted that patient had multiple medication and alcohol bottles spread around at home.  In ED, patient was briefly hypotensive with BP in 60's, CT chest/abdomen/pelvis showed no acute findings. Patient admitted with severe sepsis and was hypotensive, hypothermic, possible hypovolemia with poor PO intake; UA showed  trace leukocytes, positive nitrates. Patient with unstageable wounds on both of his buttocks and sacrum. Pt seen by SLP with MBS (total of 5 now) recommending puree and honey thick liquids due to severe pharyngeal dysphagia. Pt able to upgrade to mechanical soft and on 5/17 SLP signed off as pt was self feeding and tolerating diet as well as expected given chronicity and severity of dysphagia. On 5/18 SLP was reordered to further cognitive decline and decreased ability to take PO in the setting of many days of refusal/inability to take medication.  Per notes, plan is for pt to dc to SNF - and SLP wishes to maximize nutrition/hydration prior to dc.      SLP Plan  Continue with current plan of care      Recommendations for follow up therapy are one component of a multi-disciplinary discharge planning process, led by the attending physician.  Recommendations may be updated based on patient status, additional functional criteria and insurance authorization.    Recommendations  Diet recommendations: Regular;Nectar-thick liquid (water protocol) Liquids provided via: Straw;Cup Medication Administration: Other (Comment) Supervision: Intermittent supervision to cue for compensatory strategies;Patient able to self feed Compensations: Slow  rate;Small sips/bites;Minimize environmental distractions Postural Changes and/or Swallow Maneuvers: Seated upright 90 degrees;Upright 30-60 min after meal                Oral Care Recommendations: Oral care BID (before thin water  intake) Follow Up Recommendations: Skilled nursing-short term rehab (<3 hours/day) SLP Visit Diagnosis: Dysphagia, oropharyngeal phase (R13.12) Plan: Continue with current plan of care           [name]  03/03/2022, 5:58 PM Kathleen Lime, MS [facility] SLP Acute Rehab Services Office [phone] Pager [phone]

## 2022-03-03 NOTE — Progress Notes (Signed)
Physical Therapy Treatment Patient Details Name: [name] MRN: [id number] DOB: September 14, 1986 Today's Date: 03/03/2022   History of Present Illness 36 year old male with HIV, bipolar disorder, depression, PTSD, schizoaffective disorder, seizures presented to ED via EMS after found by home health aide on the floor. Dx of severe sepsis with shock, buttock wounds.    PT Comments    Pt very pleasant today. Required min assist +2 for bed mobility and transfers and min assist +2 for ambulation with RW and recliner follow for safety, pt ambulated ~39f with assistance scooting R foot forward as pt demonstrated difficulty with foot clearance. Pt completed seated BLE exercises with verbal cuing and some assistance for LLE, encouraged pt to complete exercises outside of therapy session. We will continue to work with the pt acutely to promote safe discharge to the destination below.   Recommendations for follow up therapy are one component of a multi-disciplinary discharge planning process, led by the attending physician.  Recommendations may be updated based on patient status, additional functional criteria and insurance authorization.  Follow Up Recommendations  Skilled nursing-short term rehab (<3 hours/day)     Assistance Recommended at Discharge Frequent or constant Supervision/Assistance  Patient can return home with the following Help with stairs or ramp for entrance;Assistance with cooking/housework;Assist for transportation;A lot of help with bathing/dressing/bathroom;A lot of help with walking and/or transfers;Direct supervision/assist for medications management;Direct supervision/assist for financial management   Equipment Recommendations  Other (comment) (TBD at SNF)    Recommendations for Other Services       Precautions / Restrictions Precautions Precautions: Fall;Other (comment) Precaution Comments: Fall risk, pt denies falls in past 6 months (was found on floor by HHA just  prior to this admission) Restrictions Weight Bearing Restrictions: No     Mobility  Bed Mobility Overal bed mobility: Needs Assistance Bed Mobility: Supine to Sit     Supine to sit: Min assist, +2 for physical assistance, HOB elevated     General bed mobility comments: Pt required min assist +2 for trunk control and LLE advancement off bed during bed mobility. Pt able to initiate movement himself. Once in seated position, required min assist to remain there due to posterior lean.    Transfers Overall transfer level: Needs assistance Equipment used: Rolling walker (2 wheels) Transfers: Sit to/from Stand Sit to Stand: +2 safety/equipment, Min assist, From elevated surface           General transfer comment: Pt min assist +2 for sit to stand transfer for steadying of RW and light lift assist, pt using BUE to stand, pt completed 2 reps, second rep pt was min guard only, no physical assist required. No overt LOB noted. Pt able to stand for 511m with min guard and BUE support on RW without LOB for dressing change around sacrum.    Ambulation/Gait Ambulation/Gait assistance: +2 safety/equipment, Min assist Gait Distance (Feet): 15 Feet Assistive device: Rolling walker (2 wheels) Gait Pattern/deviations: Step-through pattern, Decreased stride length, Decreased dorsiflexion - right, Decreased dorsiflexion - left, Antalgic (trouble advancing RLE, occasionally PT had to provide min assist to scoot R foot forward) Gait velocity: decr     General Gait Details: Pt ambulated with RW and min assist, +2 for recliner follow for safety, no overt LOB noted. Pt with antalgic gait pattern and decreased dorsiflexion bilaterally, pt had increased difficulty advancing RLE so required min assist from PT to scoot R foot forward; also PT steadying RW.   Stairs  Wheelchair Mobility    Modified Rankin (Stroke Patients Only)       Balance Overall balance assessment: Needs  assistance Sitting-balance support: Feet supported, Bilateral upper extremity supported Sitting balance-Leahy Scale: Poor     Standing balance support: Reliant on assistive device for balance, During functional activity, Bilateral upper extremity supported Standing balance-Leahy Scale: Poor Standing balance comment: Pt able to remain in static stance with BUE support on RW for 5 min with min guard assist                            Cognition Arousal/Alertness: Awake/alert Behavior During Therapy: Flat affect Overall Cognitive Status: No family/caregiver present to determine baseline cognitive functioning                                 General Comments: Pt generally more pleasant today, speaking in short sentences and smiling.        Exercises General Exercises - Lower Extremity Ankle Circles/Pumps: AROM, Both, 20 reps Long Arc Quad: AROM, AAROM, Right, Left, 10 reps, Seated (Pt provided AAROM for Left LAQ, right was AROM. VCs for slow and controlled with increased arc of motion)    General Comments        Pertinent Vitals/Pain Pain Assessment Pain Assessment: No/denies pain Faces Pain Scale: Hurts a little bit Pain Location: buttocks Pain Descriptors / Indicators: Sore Pain Intervention(s): Limited activity within patient's tolerance, Monitored during session, Repositioned    Home Living                          Prior Function            PT Goals (current goals can now be found in the care plan section) Acute Rehab PT Goals Patient Stated Goal: return home PT Goal Formulation: With patient Time For Goal Achievement: 03/02/22 Potential to Achieve Goals: Fair Progress towards PT goals: Progressing toward goals    Frequency    Min 2X/week      PT Plan Current plan remains appropriate    Co-evaluation              AM-PAC PT "6 Clicks" Mobility   Outcome Measure  Help needed turning from your back to your side  while in a flat bed without using bedrails?: A Little Help needed moving from lying on your back to sitting on the side of a flat bed without using bedrails?: A Little Help needed moving to and from a bed to a chair (including a wheelchair)?: A Lot Help needed standing up from a chair using your arms (e.g., wheelchair or bedside chair)?: A Lot Help needed to walk in hospital room?: A Lot Help needed climbing 3-5 steps with a railing? : Total 6 Click Score: 13    End of Session Equipment Utilized During Treatment: Gait belt Activity Tolerance: No increased pain;Patient limited by fatigue Patient left: with call bell/phone within reach;in chair;with chair alarm set Nurse Communication: Mobility status PT Visit Diagnosis: Difficulty in walking, not elsewhere classified (R26.2);Pain Pain - Right/Left: Left Pain - part of body: Hip     Time: 0258-5277 PT Time Calculation (min) (ACUTE ONLY): 22 min  Charges:  $Therapeutic Exercise: 8-22 mins                     Coolidge Breeze, PT, DPT [facility]  Department Office: [phone] Pager: [phone]   Coolidge Breeze 03/03/2022, 2:51 PM

## 2022-03-03 NOTE — TOC Progression Note (Signed)
Transition of Care [facility]) - Progression Note    Patient Details  Name: [name] MRN: [id number] Date of Birth: [birth year]  Transition of Care [facility]) CM/SW Contact  Joaquin Courts, RN Phone Number: 03/03/2022, 12:41 PM  Clinical Narrative:    Patient with bed offer from [facility], this remains patients only bed offer.  Facility will accept patient contingent on patient supplying latuda, Linzess, and Byktarvy medications as they are costly for the facility.  Supply of medications has been obtained and will need to be sent with patient at time of discharge.  CMA to initiate [employer] authorization. TOC will continue to follow and await authorization.    Expected Discharge Plan: [place] Barriers to Discharge: Ship broker, [place] Forensic scientist)  Expected Discharge Plan and Services Expected Discharge Plan: [place] In-house Referral: Clinical Social Work   Post Acute Care Choice: [place] arrangements for the past 2 months: Apartment                                       Social Determinants of Health (SDOH) Interventions    Readmission Risk Interventions    02/15/2022    2:35 PM 08/24/2021    4:00 PM 08/24/2021   11:54 AM  Readmission Risk Prevention Plan  Transportation Screening Complete Complete   PCP or Specialist Appt within 3-5 Days  Complete Complete  HRI or [place]  Complete Complete  Social Work Consult for [place] Planning/Counseling   Complete  Palliative Care Screening  Complete Complete  Medication Review Press photographer) Complete Complete Complete  PCP or Specialist appointment within 3-5 days of discharge Complete    HRI or [place] Complete    SW Recovery Care/Counseling Consult Complete    [place] Not Applicable

## 2022-03-03 NOTE — Progress Notes (Signed)
Refused am wound care

## 2022-03-04 ENCOUNTER — Other Ambulatory Visit (HOSPITAL_COMMUNITY): Payer: Self-pay

## 2022-03-04 ENCOUNTER — Inpatient Hospital Stay (HOSPITAL_COMMUNITY): Payer: Medicare HMO

## 2022-03-04 DIAGNOSIS — K7030 Alcoholic cirrhosis of liver without ascites: Secondary | ICD-10-CM | POA: Diagnosis not present

## 2022-03-04 DIAGNOSIS — Z91148 Patient's other noncompliance with medication regimen for other reason: Secondary | ICD-10-CM | POA: Diagnosis not present

## 2022-03-04 DIAGNOSIS — B20 Human immunodeficiency virus [HIV] disease: Secondary | ICD-10-CM | POA: Diagnosis not present

## 2022-03-04 DIAGNOSIS — G9341 Metabolic encephalopathy: Secondary | ICD-10-CM | POA: Diagnosis not present

## 2022-03-04 LAB — CBC WITH DIFFERENTIAL/PLATELET
Abs Immature Granulocytes: 0.02 10*3/uL (ref 0.00–0.07)
Basophils Absolute: 0.1 10*3/uL (ref 0.0–0.1)
Basophils Relative: 1 %
Eosinophils Absolute: 0.2 10*3/uL (ref 0.0–0.5)
Eosinophils Relative: 3 %
HCT: 23 % — ABNORMAL LOW (ref 39.0–52.0)
Hemoglobin: 7.1 g/dL — ABNORMAL LOW (ref 13.0–17.0)
Immature Granulocytes: 0 %
Lymphocytes Relative: 30 %
Lymphs Abs: 2.5 10*3/uL (ref 0.7–4.0)
MCH: 32 pg (ref 26.0–34.0)
MCHC: 30.9 g/dL (ref 30.0–36.0)
MCV: 103.6 fL — ABNORMAL HIGH (ref 80.0–100.0)
Monocytes Absolute: 0.8 10*3/uL (ref 0.1–1.0)
Monocytes Relative: 9 %
Neutro Abs: 4.9 10*3/uL (ref 1.7–7.7)
Neutrophils Relative %: 57 %
Platelets: 193 10*3/uL (ref 150–400)
RBC: 2.22 MIL/uL — ABNORMAL LOW (ref 4.22–5.81)
RDW: 17.6 % — ABNORMAL HIGH (ref 11.5–15.5)
WBC: 8.4 10*3/uL (ref 4.0–10.5)
nRBC: 0 % (ref 0.0–0.2)

## 2022-03-04 LAB — MAGNESIUM: Magnesium: 1.8 mg/dL (ref 1.7–2.4)

## 2022-03-04 LAB — BASIC METABOLIC PANEL
Anion gap: 4 — ABNORMAL LOW (ref 5–15)
BUN: 16 mg/dL (ref 6–20)
CO2: 30 mmol/L (ref 22–32)
Calcium: 8.2 mg/dL — ABNORMAL LOW (ref 8.9–10.3)
Chloride: 106 mmol/L (ref 98–111)
Creatinine, Ser: 0.98 mg/dL (ref 0.61–1.24)
GFR, Estimated: >60 mL/min (ref >60)
Glucose, Bld: 72 mg/dL (ref 70–99)
Potassium: 3.5 mmol/L (ref 3.5–5.1)
Sodium: 140 mmol/L (ref 135–145)

## 2022-03-04 MED ORDER — ENSURE ENLIVE PO LIQD
237.0000 mL | Freq: Two times a day (BID) | ORAL | 12 refills | Status: AC
  Filled 2022-03-04: qty 237, 1d supply, fill #0

## 2022-03-04 MED ORDER — MIDODRINE HCL 5 MG PO TABS
5.0000 mg | ORAL_TABLET | Freq: Three times a day (TID) | ORAL | 0 refills | Status: DC
Start: 2022-03-04 — End: 2022-07-08
  Filled 2022-03-04: qty 90, 30d supply, fill #0

## 2022-03-04 NOTE — TOC Transition Note (Addendum)
Transition of Care [facility]) - CM/SW Discharge Note   Patient Details  Name: [name] MRN: [id number] Date of Birth: 11/04/1985  Transition of Care [facility]) CM/SW Contact:  Bethann Berkshire, [place] Phone Number: 03/04/2022, 12:18 PM   Clinical Narrative:     Pt has Josem Kaufmann and Phillips Odor will be delivered today   Confirmed with Shirlee Limerick at [facility] that pt can admit today. Met with pt to discuss transition. Pt is now refusing SNF. States he wants to go home and get physical therapy there. CSW discussed SNF recommendation and concerns for lack of support pt has at home. CSW explained that SNF is short term. Pt repeatedly state he wanted to go home.   CSW called pt's mother and updated her on pt's desires. She states she had already tried talking to him yesterday urging him to go to rehab. She states he has an aide come to his home daily for 3 hours. Otherwise there is no other support. Pt's mom states she cannot assist pt at home due to age and pt's brother works as a Pharmacist, community and would be unable to provide support at home. She understands pt's refusal and likely outcome of him returning home with [facility].   1200: CSW confirmed with [facility] that they are active with pt. Notified [facility] liaison that pt is [place] today and will need maximized [facility] services. Updated pt's mom that pt is being Dc'd today; she will notify pt's aide.   1505: PTAR called for transport home    Final next level of care: [place] Barriers to Discharge: No Barriers Identified   Patient Goals and CMS Choice Patient states their goals for this hospitalization and ongoing recovery are:: return home      Discharge Placement                  Name of family member notified: Mother [place] Patient and family notified of of transfer: 03/04/22  Discharge Plan and Services In-house Referral: Clinical Social Work   Post Acute Care Choice: Home Health                                Social Determinants of Health (SDOH) Interventions     Readmission Risk Interventions    02/15/2022    2:35 PM 08/24/2021    4:00 PM 08/24/2021   11:54 AM  Readmission Risk Prevention Plan  Transportation Screening Complete Complete   PCP or Specialist Appt within 3-5 Days  Complete Complete  HRI or [place]  Complete Complete  Social Work Consult for [place] Planning/Counseling   Complete  Palliative Care Screening  Complete Complete  Medication Review Press photographer) Complete Complete Complete  PCP or Specialist appointment within 3-5 days of discharge Complete    HRI or [place] Complete    SW Recovery Care/Counseling Consult Complete    [place] Not Applicable

## 2022-03-04 NOTE — Progress Notes (Signed)
Refused am wound care

## 2022-03-04 NOTE — Progress Notes (Addendum)
Pt's mother and pt's NT (Tia) notified that PTAR has arrive to unit.

## 2022-03-04 NOTE — Progress Notes (Signed)
Pt mother called, via telephone, by this RN. Pt's mother called and educated on purpose of ensure supplements. Pt mother declined ensure supplements from [facility] outpatient pharmacy. Pt mother states that they will purchase this supplement on their own time.

## 2022-03-04 NOTE — Progress Notes (Signed)
Patient belongings were returned. Patient's home meds were returned to him. Patient will be discharging home with family.

## 2022-03-04 NOTE — Discharge Summary (Addendum)
Discharge Summary  [name] JHE:[id number] DOB: March 24, 1986  PCP: Drew Ebbs, MD  Admit date: 02/13/2022 Discharge date: 03/04/2022  Time spent: 1mns, more than 50% time spent on coordination of care.   Recommendations for Outpatient Follow-up:  F/u with PCP within a week  for hospital discharge follow up, repeat cbc/bmp at follow up F/u with psychiatry Drew Drew BaltimoreF/u with ID Patient declined SNF, maximize home health, patient and mother  is aware pcp can help with snf placement if he changes his mind  Discharge Diagnoses:  Active Hospital Problems   Diagnosis Date Noted   Sepsis ([facility] 02/13/2022   Protein-calorie malnutrition, severe ([place] 10/09/2021    Priority: Medium     Class: Chronic   Noncompliance with medications 02/20/2022   Hyperphosphatemia 018/56/3149  Alcoholic cirrhosis of liver ([facility] 02/17/2022   Chronic alcohol use 02/17/2022   Bipolar disorder ([facility] 070/26/3785  Alcoholic cirrhosis of liver without ascites ([facility] 188/50/2774  Acute metabolic encephalopathy 012/87/8676  HIV disease ([facility]    Prolonged QT interval    Schizoaffective disorder, depressive type ([facility] 05/13/2015    Resolved Hospital Problems  No resolved problems to display.    Discharge Condition: stable  Diet recommendation: regular diet, nectar thick liquid  Filed Weights   02/13/22 1521  Weight: 86 kg    History of present illness: ( per admitting MD Drew RTana [name] PCP: ANolene Ebbs MD  Patient coming from: Home   Chief Complaint: Found on the floor by home health aide   HPI: ABran AldridgeWFrantais a 36y.o. male with medical history significant of HIV, bipolar disorder, depression, PTSD, schizoaffective disorder, seizures presented to ED after found by home health aide on the floor.  Patient had a unwitnessed fall out of the bed and was found by the home health aide who called the EMS.  At the time of examination, patient is somnolent, arousable to verbal commands however not  able to provide any history.  He is oriented to self and place.  History is based on EMS, ED records.  Patient was found to be hypotensive by EMS with BP 82/40, hypothermic with temp of 94.4 deg F.  Prior EDP note, patient was found disheveled at home with multiple medications and alcohol bottles spread around. Patient was found to have draining wound to the buttocks. In ED, initial temp 94.4 F, improved to 97.2 F. BP 95/67, heart rate 94, respirate 16, O2 sats 97% on 2 L CMET showed sodium 138, potassium 3.4, BUN 6, creatinine 1.0, albumin <1.5 Lactic acid 2.9 WBC 13.2, hemoglobin 11.5, hematocrit 36.7, platelets 149K   In ED, SBP dropped to 60s and patient was given hydrocortisone 100 mg IV x1, multiple fluid boluses, IV vancomycin and cefepime    CT chest abdomen pelvis showed no acute findings in the chest abdomen pelvis.  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  Stable adenopathy over the superficial inguinal and external iliac chain likely related to HIV disease.  Left basilar atelectasis    Hospital Course:  Principal Problem:   Sepsis ([place] Active Problems:   Protein-calorie malnutrition, severe (HCC)   Schizoaffective disorder, depressive type (HCC)   HIV disease ([place]   Prolonged QT interval   Acute metabolic encephalopathy   Alcoholic cirrhosis of liver without ascites (HCC)   Hyperphosphatemia   Alcoholic cirrhosis of liver (HCC)   Chronic alcohol use   Bipolar disorder ([facility]   Noncompliance with medications  Assessment and Plan:   Severe Sepsis  with acute metabolic encephalopathy ([place]) possibly due to bilateral buttock wounds and hidradenitis suppurativa: Blood culture no growth, Finished abx treatment in the hospital, Sepsis and encephalopathy resolved,  Seen by wound care recommended daily wound care : Cleanse open areas to buttocks, posterior thighs with NS and pat dry.  Apply aquacel Ag to open areas.  Cover with  silicone foam. Barrier cream to surrounding skin.  Change daily.  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 He desires to f/u with pcp for these wounds, he declined outpatient wound care center referral.   Hypotension Bp stable on midodrine, patient is advised to check blood pressure at home and f/u with pcp   HIV: CD4 960  Continue current regimen. F/u with ID      Prolonged QTc: Try to keep potassium greater than 4 magnesium greater than 2. Avoid Qtc prolonging agent F/u with pcp    History of bipolar disorder/schizoaffective disorder: Continue home meds F/u with psychiatry [name]   Normocytic anemia/anemia of chronic disease/chronic alcohol use: With a hemoglobin of 7.7, MCV of 100   B12 and a folate level unremarkable He is status post 1 unit of packed red blood cells on 02/19/2022. Hgb stable at baseline, he declined supportive prbc transfusion prior to discharge, he is advised to f/u with pcp Patient is advised to stop alcohol use  Thrombocytopenia, likely from acute infection Likely reactive HIT panel negative. resolved  Chronic alcohol abuse/liver cirrhosis alcoholic: No signs of withdrawal  continue lactulose 3 times daily.  Severe protein caloric malnutrition/dysphagia: Counseling.  Continue Ensure 3 times daily. Speech evaluated the patient advanced diet to regular diet and nectar thick liquids, speech to continue to follow patient at home  Electrolyte abnormalities: Hypokalemia: Hypophosphatemia: Hypomagnesemia: Replaced and improved, encourage oral intake   Noncompliance with medication. Patient is able to reasoning and understand the conseqences of his choices He is able to tell me all his doctor's name and what he is seeing them for.  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 His mother updated over the phone with permission    Discharge Exam: BP 99/66 (BP Location: Left Arm)   Pulse 98   Temp 98.2 F (36.8 C) (Oral)   Resp 18   Ht 6' 3"  (1.905 m)   Wt 86 kg   SpO2 100%   BMI 23.70 kg/m   General: NAD, AAOx4, calm and cooperative  Cardiovascular: RRR Respiratory: normal respiratory effort     Discharge Instructions     Diet general   Complete by: As directed    Nectar thick liquid Aspiration precaution   Discharge wound care:   Complete by: As directed    Clean open areas to biotics, posterior thighs with normal saline and pat dry.  Apply Aquacel Ag to open areas.  Cover with silicone foam.  Barrier cream to surrounding skin. Change daily.   Discharge wound care:   Complete by: As directed    As described   Increase activity slowly   Complete by: As directed       Allergies as of 03/04/2022       Reactions   Dapsone Other (See Comments)  Per centricity "G6PD deficient"   Primaquine Phosphate Other (See Comments)   Per Centricity "G6PD deficient"        Medication List     STOP taking these medications    amoxicillin-clavulanate 875-125 MG tablet Commonly known as: AUGMENTIN   benzonatate 100 MG capsule Commonly known as: TESSALON   cetirizine 10 MG tablet Commonly known as: ZYRTEC   clonazePAM 0.5 MG tablet Commonly known as: KlonoPIN   diphenoxylate-atropine 2.5-0.025 MG tablet Commonly known as: LOMOTIL   ergocalciferol 1.25 MG (50000 UT) capsule Commonly known as: VITAMIN D2   famotidine 40 MG tablet Commonly known as: PEPCID   FLUoxetine 40 MG capsule Commonly known as: PROZAC   furosemide 40 MG tablet Commonly known as: LASIX   HYDROcodone-acetaminophen 5-325 MG tablet Commonly known as: NORCO/VICODIN   ibuprofen 800 MG tablet Commonly known as: ADVIL   meclizine 25 MG tablet Commonly known as: ANTIVERT    OLANZapine zydis 5 MG disintegrating tablet Commonly known as: ZYPREXA   ondansetron 8 MG tablet Commonly known as: ZOFRAN   promethazine 12.5 MG tablet Commonly known as: PHENERGAN   topiramate 100 MG tablet Commonly known as: TOPAMAX       TAKE these medications    albuterol 108 (90 Base) MCG/ACT inhaler Commonly known as: VENTOLIN HFA Inhale 1 puff into the lungs every 6 (six) hours as needed for wheezing or shortness of breath.   alprazolam 2 MG tablet Commonly known as: XANAX Take 2 mg by mouth in the morning, at noon, and at bedtime.   Biktarvy 50-200-25 MG Tabs tablet Generic drug: bictegravir-emtricitabine-tenofovir AF Take 1 tablet by mouth daily.   escitalopram 20 MG tablet Commonly known as: LEXAPRO Take 1 tablet by mouth daily.   feeding supplement Liqd Take 237 mLs by mouth 2 (two) times daily between meals.   folic acid 1 MG tablet Commonly known as: FOLVITE Take 1 mg by mouth daily.   food thickener Gel Commonly known as: SIMPLYTHICK (NECTAR/LEVEL 2/MILDLY THICK) Take 1 packet by mouth as needed.   haloperidol 5 MG tablet Commonly known as: HALDOL Take 5 mg by mouth daily.   hydrOXYzine 50 MG tablet Commonly known as: ATARAX Take 50 mg by mouth every 6 (six) hours as needed for anxiety.   lactulose 10 GM/15ML solution Commonly known as: CHRONULAC Take 30 mLs (20 g total) by mouth 2 (two) times daily. What changed: how much to take   Linzess 145 MCG Caps capsule Generic drug: linaclotide Take 1 capsule (145 mcg total) by mouth daily before breakfast.   Lurasidone HCl 120 MG Tabs Take 1 tablet by mouth daily.   magnesium oxide 400 (240 Mg) MG tablet Commonly known as: MAG-OX Take 1 tablet (400 mg total) by mouth daily.   midodrine 5 MG tablet Commonly known as: PROAMATINE Take 1 tablet by mouth 3 times daily with meals.   multivitamin with minerals Tabs tablet Take 1 tablet by mouth daily.   nicotine 14 mg/24hr patch Commonly  known as: NICODERM CQ - dosed in mg/24 hours Place 1 patch (14 mg total) onto the skin daily.   pantoprazole 40 MG tablet Commonly known as: PROTONIX Take 40 mg by mouth daily.   thiamine 100 MG tablet Take 100 mg by mouth daily.   trazodone 300 MG tablet Commonly known as: DESYREL Take 300 mg by mouth daily as needed for sleep.   valACYclovir 1000 MG tablet Commonly known as: VALTREX Take 1 tablet (1,000 mg total) by mouth daily.  Discharge Care Instructions  (From admission, onward)           Start     Ordered   03/04/22 0000  Discharge wound care:       Comments: Clean open areas to biotics, posterior thighs with normal saline and pat dry.  Apply Aquacel Ag to open areas.  Cover with silicone foam.  Barrier cream to surrounding skin. Change daily.   03/04/22 1153   03/04/22 0000  Discharge wound care:       Comments: As described   03/04/22 1227           Allergies  Allergen Reactions   Dapsone Other (See Comments)    Per centricity "G6PD deficient"   Primaquine Phosphate Other (See Comments)    Per Centricity "G6PD deficient"    Follow-up Information     Drew Ebbs, MD Follow up in 1 week(s).   Specialty: Internal Medicine Why: hospital discharge follow up , repeat cbc/bmp at follow up pcp to monitor leg/buttock wound please check blood pressure at home twice a day, bring in record for your pcp to review Contact information: [place] [address] [phone]         Long, Sherrye Payor, MD Follow up.   Specialty: Psychiatry Contact information: [place] [address] [phone]         Marlinda Mike, NP Follow up.   Specialty: Internal Medicine Contact information: [place] [address] [phone]                  The results of significant diagnostics from this hospitalization (including imaging, microbiology, ancillary and  laboratory) are listed below for reference.    Significant Diagnostic Studies: CT HEAD WO CONTRAST (5MM)  Result Date: 02/13/2022 CLINICAL DATA:  Mental status change. EXAM: CT HEAD WITHOUT CONTRAST TECHNIQUE: Contiguous axial images were obtained from the base of the skull through the vertex without intravenous contrast. RADIATION DOSE REDUCTION: This exam was performed according to the departmental dose-optimization program which includes automated exposure control, adjustment of the mA and/or kV according to patient size and/or use of iterative reconstruction technique. COMPARISON:  August 23, 2021 FINDINGS: Brain: No evidence of acute infarction, hemorrhage, hydrocephalus, extra-axial collection or mass lesion/mass effect. Vascular: No hyperdense vessel or unexpected calcification. Skull: Normal. Negative for fracture or focal lesion. Sinuses/Orbits: No acute finding. Other: None. IMPRESSION: No acute intracranial abnormalities. No cause for the patient's symptoms identified. Electronically Signed   By: Dorise Bullion III M.D.   On: 02/13/2022 19:21   CT CHEST ABDOMEN PELVIS W CONTRAST  Result Date: 02/13/2022 CLINICAL DATA:  Sepsis.  Unwitnessed fall this morning. EXAM: CT CHEST, ABDOMEN, AND PELVIS WITH CONTRAST TECHNIQUE: Multidetector CT imaging of the chest, abdomen and pelvis was performed following the standard protocol during bolus administration of intravenous contrast. RADIATION DOSE REDUCTION: This exam was performed according to the departmental dose-optimization program which includes automated exposure control, adjustment of the mA and/or kV according to patient size and/or use of iterative reconstruction technique. CONTRAST:  122m OMNIPAQUE IOHEXOL 300 MG/ML  SOLN COMPARISON:  CT chest 07/23/2018 and CT abdomen/pelvis 10/06/2021 FINDINGS: CT CHEST FINDINGS Cardiovascular: Heart is normal in size. Thoracic aorta is normal in caliber. Pulmonary arterial system is unremarkable. Remaining  vascular structures are within normal. Mediastinum/Nodes: No mediastinal or hilar adenopathy. Remaining mediastinal structures are unremarkable. Lungs/Pleura: Lungs are adequately inflated without focal airspace consolidation or effusion. Mild opacification over  the posterior left base likely atelectasis. Airways are normal. Musculoskeletal: No acute fracture. CT ABDOMEN PELVIS FINDINGS Hepatobiliary: The liver, gallbladder and biliary tree are normal. Pancreas: Normal. Spleen: Normal. Adrenals/Urinary Tract: Adrenal glands are normal. Kidneys are normal size without hydronephrosis or nephrolithiasis. Ureters and bladder are normal. Stomach/Bowel: Stomach and small bowel are normal. Appendix is normal. Mild decompression of the ascending colon as the colon is otherwise unremarkable. Vascular/Lymphatic: Abdominal aorta is normal in caliber with minimal calcified plaque present. Remaining vasculature is within normal. Mild stable bilateral external iliac chain and superficial inguinal adenopathy which may be related to patient's HIV disease. Reproductive: Normal. Other: Mild subcutaneous edema lateral to the hips right worse than left as well as gluteal region. Small umbilical hernia containing only peritoneal fat unchanged. Musculoskeletal: Moderate avascular necrosis of the left femoral head with subchondral fracture which is unchanged. IMPRESSION: 1. No acute findings within the chest, abdomen or pelvis. 2. 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 3. Stable small umbilical hernia containing only peritoneal fat. 4. Aortic atherosclerosis. 5. Stable adenopathy over the superficial inguinal and external iliac chain likely related to patient's HIV disease. 6. Left basilar atelectasis. Aortic Atherosclerosis (ICD10-I70.0). Electronically Signed   By: Marin Olp M.D.   On: 02/13/2022 16:17   DG CHEST PORT 1 VIEW  Result Date: 03/04/2022 CLINICAL DATA:  PICC line  placement EXAM: PORTABLE CHEST 1 VIEW COMPARISON:  Chest x-ray dated Feb 13, 2022 FINDINGS: Interval placement of right arm PICC with tip projecting over the expected area of the mid SVC. Cardiac and mediastinal contours are within normal limits. New linear right basilar opacity, likely atelectasis. No large pleural effusion or pneumothorax. IMPRESSION: Interval placement of right arm PICC with tip projecting over the expected area of the mid SVC. Electronically Signed   By: Yetta Glassman M.D.   On: 03/04/2022 09:01   DG Chest Port 1 View  Result Date: 02/13/2022 CLINICAL DATA:  Questionable sepsis - evaluate for abnormality EXAM: PORTABLE CHEST 1 VIEW COMPARISON:  10/26/2021 FINDINGS: Low lung volumes.The cardiomediastinal contours are normal. Minor atelectasis at the left lung base. Pulmonary vasculature is normal. No confluent consolidation, pleural effusion, or pneumothorax. No acute osseous abnormalities are seen. IMPRESSION: Low lung volumes with minor atelectasis at the left lung base. Electronically Signed   By: Keith Rake M.D.   On: 02/13/2022 14:47   DG Swallowing Func-Speech Pathology  Result Date: 02/14/2022 Table formatting from the original result was not included. slp Objective Swallowing Evaluation: Type of Study: MBS-Modified Barium Swallow Study  Patient Details Name: ALVINO LECHUGA MRN: [id number] Date of Birth: 1986-09-30 Today's Date: 02/14/2022 Time: SLP Start Time (ACUTE ONLY): 1515 -SLP Stop Time (ACUTE ONLY): 1535 SLP Time Calculation (min) (ACUTE ONLY): 20 min Past Medical History: Past Medical History: Diagnosis Date  Bipolar 1 disorder ([place])   Depression   Dizziness and giddiness 02/01/2016  Herpes genitalia   HIV disease ([place])   Hypertension   Hyponatremia   Hypothermia 08/24/2021  Migraine headache 02/01/2016  Peripheral neuropathy 10/01/2019  PTSD (post-traumatic stress disorder)   Schizoaffective disorder ([place])   Seizures ([place])  Past Surgical History: Past Surgical History:  Procedure Laterality Date  BACK SURGERY    BIOPSY  02/26/2021  Procedure: BIOPSY;  Surgeon: Otis Brace, MD;  Location: [facility];  Service: Gastroenterology;;  COLONOSCOPY WITH PROPOFOL [name] 02/26/2021  Procedure: COLONOSCOPY WITH PROPOFOL;  Surgeon: Otis Brace, MD;  Location: [facility];  Service: Gastroenterology;  Laterality: [name];  ESOPHAGOGASTRODUODENOSCOPY (EGD) WITH PROPOFOL [name] 02/26/2021  Procedure: ESOPHAGOGASTRODUODENOSCOPY (EGD) WITH PROPOFOL;  Surgeon: Otis Brace, MD;  Location: [facility];  Service: Gastroenterology;  Laterality: [name];  ESOPHAGOGASTRODUODENOSCOPY (EGD) WITH PROPOFOL [name] 05/18/2021  Procedure: ESOPHAGOGASTRODUODENOSCOPY (EGD) WITH PROPOFOL;  Surgeon: Otis Brace, MD;  Location: [facility];  Service: Gastroenterology;  Laterality: [name];  HAND SURGERY   HPI: Patient is a 36 y.o. male who is well known to SLP secondary to frequent admissions and chronic dysphagia. He has PMH: HIV, bipolar disorder, PTSD, schizoaffective disorder, seizures. He presented to [facility] ED via EMS after being found on the floor at home by home health aide. Apparently, patient had an unwittnessed fall out of bed. When EMS arrived, they noted that patient had multiple medication and alcohol bottles spread around at home.  In ED, patient was briefly hypotensive with BP in 60's, CT chest/abdomen/pelvis showed no acute findings. Patient admitted with severe sepsis and was hypotensive, hypothermic, possible hypovolemia with poor PO intake; UA showed  trace leukocytes, positive nitrates. Patient with unstageable wounds on both of his buttocks and sacrum.  Subjective: alert,cooperative  Recommendations for follow up therapy are one component of a multi-disciplinary discharge planning process, led by the attending physician.  Recommendations may be updated based on patient status, additional functional criteria and insurance authorization. Assessment / Plan / Recommendation   02/14/2022   4:09 PM  Clinical Impressions Clinical Impression Patient presents with a mildly impaired oral phase and a severely impaired pharyngeal phase of swallow function. During oral phase, patient exhibit mild anterior to posterior delays with puree solids and honey thick liquids. During pharyngeal phase, he exhibited swallow initiation delays to level of vallecular sinus with puree and honey thick liquids and to level of pyriform sinus with nectar thick liquids. He exhibited a moderate amount of aspiration during the swallow and after the swallow from residuals with nectar thick liquids. He exhibited immediate cough when aspiration began but this was ineffective to clear aspirate and led to patient continuing to cough and start to wretch, prompting radiology tech to prepare oral suction. He did not exhibit any penetration or aspiration with honey thick liquids or puree solids and only trace to mild vallecular and pyriform sinus residuals observed post initial swallows. SLP did observe mild amount of puree barium remaining on top of UES and required a second swallow to clear. One instance of retrograde movement of puree barium at level of cervical esophagus. SLP is recommending to initiate PO diet of Dys 1 (puree) solids and honey thick liquids. SLP will follow patient for diet toleration and upgraded solids trials at bedside. Prognosis for upgrading solids is good based on current and past performance, however prognosis of upgrading liquids from honey thick is guarded. SLP Visit Diagnosis Dysphagia, unspecified (R13.10) Impact on safety and function Severe aspiration risk;Risk for inadequate nutrition/hydration     02/14/2022   4:09 PM Treatment Recommendations Treatment Recommendations Therapy as outlined in treatment plan below     02/14/2022   4:17 PM Prognosis Prognosis for Safe Diet Advancement Fair Barriers to Reach Goals Time post onset;Cognitive deficits;Severity of deficits   02/14/2022   4:09 PM Diet Recommendations SLP Diet  Recommendations Honey thick liquids;Dysphagia 1 (Puree) solids Liquid Administration via Cup Medication Administration Whole meds with puree Compensations Slow rate;Small sips/bites;Minimize environmental distractions     02/14/2022   4:09 PM Other Recommendations Oral Care Recommendations Oral care BID Follow Up Recommendations No SLP follow up Assistance recommended at discharge Frequent or constant Supervision/Assistance Functional Status  Assessment Patient has had a recent decline in their functional status and demonstrates the ability to make significant improvements in function in a reasonable and predictable amount of time.   02/14/2022   4:09 PM Frequency and Duration  Speech Therapy Frequency (ACUTE ONLY) min 2x/week Treatment Duration 1 week     02/14/2022   4:06 PM Oral Phase Oral Phase Impaired Oral - Honey Cup Reduced posterior propulsion Oral - Nectar Cup [facility] Oral - Puree Reduced posterior propulsion    02/14/2022   4:07 PM Pharyngeal Phase Pharyngeal Phase Impaired Pharyngeal- Honey Cup Delayed swallow initiation-vallecula;Reduced pharyngeal peristalsis;Pharyngeal residue - valleculae;Pharyngeal residue - pyriform;Pharyngeal residue - cp segment Pharyngeal- Nectar Cup Delayed swallow initiation-pyriform sinuses;Reduced airway/laryngeal closure;Penetration/Aspiration during swallow;Penetration/Apiration after swallow;Moderate aspiration Pharyngeal Material enters airway, passes BELOW cords and not ejected out despite cough attempt by patient Pharyngeal- Puree Reduced pharyngeal peristalsis;Pharyngeal residue - cp segment    02/14/2022   4:08 PM Cervical Esophageal Phase  Cervical Esophageal Phase Impaired Honey Cup Esophageal backflow into cervical esophagus;Reduced cricopharyngeal relaxation Nectar Cup Reduced cricopharyngeal relaxation Puree Reduced cricopharyngeal relaxation Sonia Baller, MA, CCC-SLP Speech Therapy                     [place] EKG SITE RITE  Result Date: 02/14/2022 If Site Rite image not  attached, placement could not be confirmed due to current cardiac rhythm.  [place] EKG SITE RITE  Result Date: 02/14/2022 If Site Rite image not attached, placement could not be confirmed due to current cardiac rhythm.   Microbiology: No results found for this or any previous visit (from the past 240 hour(s)).   Labs: Basic Metabolic Panel: Recent Labs  Lab 02/27/22 0342 02/28/22 0320 03/01/22 0434 03/02/22 0427 03/03/22 0404 03/04/22 0234  NA  --   --   --   --   --  140  K  --   --   --   --   --  3.5  CL  --   --   --   --   --  106  CO2  --   --   --   --   --  30  GLUCOSE  --   --   --   --   --  72  BUN  --   --   --   --   --  16  CREATININE  --   --   --   --   --  0.98  CALCIUM  --   --   --   --   --  8.2*  MG 1.7 1.7 1.6* 1.8 1.8 1.8  PHOS 3.0 3.6 3.9 3.3 3.4  --    Liver Function Tests: No results for input(s): AST, ALT, ALKPHOS, BILITOT, PROT, ALBUMIN in the last 168 hours. No results for input(s): LIPASE, AMYLASE in the last 168 hours. No results for input(s): AMMONIA in the last 168 hours. CBC: Recent Labs  Lab 03/04/22 0234  WBC 8.4  NEUTROABS 4.9  HGB 7.1*  HCT 23.0*  MCV 103.6*  PLT 193   Cardiac Enzymes: No results for input(s): CKTOTAL, CKMB, CKMBINDEX, TROPONINI in the last 168 hours. BNP: BNP (last 3 results) Recent Labs    05/07/21 1444 05/11/21 0847  BNP 87.8 220.9*    ProBNP (last 3 results) No results for input(s): PROBNP in the last 8760 hours.  CBG: No results for input(s): GLUCAP in the last 168 hours.  FURTHER DISCHARGE INSTRUCTIONS:  Get Medicines reviewed and adjusted: Please take all your medications with you for your next visit with your Primary MD   Laboratory/radiological data: Please request your Primary MD to go over all hospital tests and procedure/radiological results at the follow up, please ask your Primary MD to get all Hospital records sent to his/her office.   In some cases, they will be blood work, cultures  and biopsy results pending at the time of your discharge. Please request that your primary care M.D. goes through all the records of your hospital data and follows up on these results.   Also Note the following: If you experience worsening of your admission symptoms, develop shortness of breath, life threatening emergency, suicidal or homicidal thoughts you must seek medical attention immediately by calling 911 or calling your MD immediately  if symptoms less severe.   You must read complete instructions/literature along with all the possible adverse reactions/side effects for all the Medicines you take and that have been prescribed to you. Take any new Medicines after you have completely understood and accpet all the possible adverse reactions/side effects.    Do not drive when taking Pain medications or sleeping medications (Benzodaizepines)   Do not take more than prescribed Pain, Sleep and Anxiety Medications. It is not advisable to combine anxiety,sleep and pain medications without talking with your primary care practitioner   Special Instructions: If you have smoked or chewed Tobacco  in the last 2 yrs please stop smoking, stop any regular Alcohol  and or any Recreational drug use.   Wear Seat belts while driving.   Please note: You were cared for by a hospitalist during your hospital stay. Once you are discharged, your primary care physician will handle any further medical issues. Please note that NO REFILLS for any discharge medications will be authorized once you are discharged, as it is imperative that you return to your primary care physician (or establish a relationship with a primary care physician if you do not have one) for your post hospital discharge needs so that they can reassess your need for medications and monitor your lab values.     Signed:  Florencia Reasons MD, PhD, FACP  [facility] 03/04/2022, 12:55 PM

## 2022-03-08 ENCOUNTER — Other Ambulatory Visit (HOSPITAL_COMMUNITY): Payer: Self-pay

## 2022-05-19 ENCOUNTER — Other Ambulatory Visit: Payer: Self-pay | Admitting: Internal Medicine

## 2022-05-20 LAB — CBC WITH DIFFERENTIAL/PLATELET
Absolute Monocytes: 1193 cells/uL — ABNORMAL HIGH (ref 200–950)
Basophils Absolute: 67 cells/uL (ref 0–200)
Basophils Relative: 0.5 %
Eosinophils Absolute: 134 cells/uL (ref 15–500)
Eosinophils Relative: 1 %
HCT: 38.1 % — ABNORMAL LOW (ref 38.5–50.0)
Hemoglobin: 12.7 g/dL — ABNORMAL LOW (ref 13.2–17.1)
Lymphs Abs: 2305 cells/uL (ref 850–3900)
MCH: 32.4 pg (ref 27.0–33.0)
MCHC: 33.3 g/dL (ref 32.0–36.0)
MCV: 97.2 fL (ref 80.0–100.0)
MPV: 9.5 fL (ref 7.5–12.5)
Monocytes Relative: 8.9 %
Neutro Abs: 9702 cells/uL — ABNORMAL HIGH (ref 1500–7800)
Neutrophils Relative %: 72.4 %
Platelets: 192 10*3/uL (ref 140–400)
RBC: 3.92 10*6/uL — ABNORMAL LOW (ref 4.20–5.80)
RDW: 13.5 % (ref 11.0–15.0)
Total Lymphocyte: 17.2 %
WBC: 13.4 10*3/uL — ABNORMAL HIGH (ref 3.8–10.8)

## 2022-05-20 LAB — COMPLETE METABOLIC PANEL WITH GFR
AG Ratio: 0.5 (calc) — ABNORMAL LOW (ref 1.0–2.5)
ALT: 24 U/L (ref 9–46)
AST: 42 U/L — ABNORMAL HIGH (ref 10–40)
Albumin: 2.7 g/dL — ABNORMAL LOW (ref 3.6–5.1)
Alkaline phosphatase (APISO): 269 U/L — ABNORMAL HIGH (ref 36–130)
BUN: 7 mg/dL (ref 7–25)
CO2: 21 mmol/L (ref 20–32)
Calcium: 8.9 mg/dL (ref 8.6–10.3)
Chloride: 95 mmol/L — ABNORMAL LOW (ref 98–110)
Creat: 0.95 mg/dL (ref 0.60–1.26)
Globulin: 5.3 g/dL (calc) — ABNORMAL HIGH (ref 1.9–3.7)
Glucose, Bld: 65 mg/dL (ref 65–99)
Potassium: 3.9 mmol/L (ref 3.5–5.3)
Sodium: 133 mmol/L — ABNORMAL LOW (ref 135–146)
Total Bilirubin: 0.3 mg/dL (ref 0.2–1.2)
Total Protein: 8 g/dL (ref 6.1–8.1)
eGFR: 105 mL/min/{1.73_m2} (ref >=60)

## 2022-06-24 ENCOUNTER — Encounter (HOSPITAL_COMMUNITY)
Admission: EM | Disposition: A | Discharge status: Home Health | Payer: Self-pay | Source: Home / Self Care | Attending: Internal Medicine

## 2022-06-24 ENCOUNTER — Emergency Department (HOSPITAL_COMMUNITY): Payer: Medicare HMO

## 2022-06-24 ENCOUNTER — Inpatient Hospital Stay (HOSPITAL_COMMUNITY)
Admission: EM | Admit: 2022-06-24 | Discharge: 2022-07-08 | DRG: 974 | Disposition: A | Discharge status: Home Health | Payer: Medicare HMO | Attending: Internal Medicine | Admitting: Internal Medicine

## 2022-06-24 ENCOUNTER — Encounter (HOSPITAL_COMMUNITY): Payer: Self-pay | Admitting: Emergency Medicine

## 2022-06-24 DIAGNOSIS — D696 Thrombocytopenia, unspecified: Secondary | ICD-10-CM | POA: Diagnosis present

## 2022-06-24 DIAGNOSIS — A419 Sepsis, unspecified organism: Secondary | ICD-10-CM | POA: Diagnosis present

## 2022-06-24 DIAGNOSIS — G909 Disorder of the autonomic nervous system, unspecified: Secondary | ICD-10-CM | POA: Diagnosis present

## 2022-06-24 DIAGNOSIS — F17210 Nicotine dependence, cigarettes, uncomplicated: Secondary | ICD-10-CM | POA: Diagnosis present

## 2022-06-24 DIAGNOSIS — L732 Hidradenitis suppurativa: Secondary | ICD-10-CM | POA: Diagnosis present

## 2022-06-24 DIAGNOSIS — R9431 Abnormal electrocardiogram [ECG] [EKG]: Secondary | ICD-10-CM | POA: Diagnosis not present

## 2022-06-24 DIAGNOSIS — R680 Hypothermia, not associated with low environmental temperature: Secondary | ICD-10-CM | POA: Diagnosis present

## 2022-06-24 DIAGNOSIS — R6520 Severe sepsis without septic shock: Secondary | ICD-10-CM | POA: Diagnosis present

## 2022-06-24 DIAGNOSIS — F251 Schizoaffective disorder, depressive type: Secondary | ICD-10-CM | POA: Diagnosis present

## 2022-06-24 DIAGNOSIS — E860 Dehydration: Secondary | ICD-10-CM | POA: Diagnosis present

## 2022-06-24 DIAGNOSIS — E43 Unspecified severe protein-calorie malnutrition: Secondary | ICD-10-CM | POA: Diagnosis present

## 2022-06-24 DIAGNOSIS — Z66 Do not resuscitate: Secondary | ICD-10-CM | POA: Diagnosis present

## 2022-06-24 DIAGNOSIS — E8809 Other disorders of plasma-protein metabolism, not elsewhere classified: Secondary | ICD-10-CM | POA: Diagnosis present

## 2022-06-24 DIAGNOSIS — R1310 Dysphagia, unspecified: Secondary | ICD-10-CM | POA: Diagnosis present

## 2022-06-24 DIAGNOSIS — F4310 Post-traumatic stress disorder, unspecified: Secondary | ICD-10-CM | POA: Diagnosis present

## 2022-06-24 DIAGNOSIS — K2100 Gastro-esophageal reflux disease with esophagitis, without bleeding: Secondary | ICD-10-CM | POA: Diagnosis not present

## 2022-06-24 DIAGNOSIS — D638 Anemia in other chronic diseases classified elsewhere: Secondary | ICD-10-CM | POA: Diagnosis present

## 2022-06-24 DIAGNOSIS — J9601 Acute respiratory failure with hypoxia: Secondary | ICD-10-CM | POA: Diagnosis present

## 2022-06-24 DIAGNOSIS — F1090 Alcohol use, unspecified, uncomplicated: Secondary | ICD-10-CM | POA: Diagnosis present

## 2022-06-24 DIAGNOSIS — Z79899 Other long term (current) drug therapy: Secondary | ICD-10-CM

## 2022-06-24 DIAGNOSIS — G9341 Metabolic encephalopathy: Secondary | ICD-10-CM | POA: Diagnosis present

## 2022-06-24 DIAGNOSIS — N179 Acute kidney failure, unspecified: Secondary | ICD-10-CM

## 2022-06-24 DIAGNOSIS — I10 Essential (primary) hypertension: Secondary | ICD-10-CM | POA: Diagnosis present

## 2022-06-24 DIAGNOSIS — N3080 Other cystitis without hematuria: Secondary | ICD-10-CM | POA: Diagnosis present

## 2022-06-24 DIAGNOSIS — L03314 Cellulitis of groin: Secondary | ICD-10-CM | POA: Diagnosis present

## 2022-06-24 DIAGNOSIS — F319 Bipolar disorder, unspecified: Secondary | ICD-10-CM | POA: Diagnosis present

## 2022-06-24 DIAGNOSIS — B20 Human immunodeficiency virus [HIV] disease: Secondary | ICD-10-CM | POA: Diagnosis present

## 2022-06-24 DIAGNOSIS — Z888 Allergy status to other drugs, medicaments and biological substances status: Secondary | ICD-10-CM

## 2022-06-24 DIAGNOSIS — F1010 Alcohol abuse, uncomplicated: Secondary | ICD-10-CM | POA: Diagnosis present

## 2022-06-24 DIAGNOSIS — K219 Gastro-esophageal reflux disease without esophagitis: Secondary | ICD-10-CM | POA: Diagnosis present

## 2022-06-24 DIAGNOSIS — G629 Polyneuropathy, unspecified: Secondary | ICD-10-CM | POA: Diagnosis present

## 2022-06-24 DIAGNOSIS — E876 Hypokalemia: Secondary | ICD-10-CM | POA: Diagnosis present

## 2022-06-24 DIAGNOSIS — Z91048 Other nonmedicinal substance allergy status: Secondary | ICD-10-CM

## 2022-06-24 DIAGNOSIS — Z6822 Body mass index (BMI) 22.0-22.9, adult: Secondary | ICD-10-CM

## 2022-06-24 DIAGNOSIS — R159 Full incontinence of feces: Secondary | ICD-10-CM | POA: Diagnosis present

## 2022-06-24 DIAGNOSIS — I951 Orthostatic hypotension: Secondary | ICD-10-CM | POA: Diagnosis present

## 2022-06-24 DIAGNOSIS — N12 Tubulo-interstitial nephritis, not specified as acute or chronic: Secondary | ICD-10-CM | POA: Diagnosis present

## 2022-06-24 DIAGNOSIS — Z20822 Contact with and (suspected) exposure to covid-19: Secondary | ICD-10-CM | POA: Diagnosis present

## 2022-06-24 DIAGNOSIS — D510 Vitamin B12 deficiency anemia due to intrinsic factor deficiency: Secondary | ICD-10-CM | POA: Diagnosis not present

## 2022-06-24 DIAGNOSIS — Z811 Family history of alcohol abuse and dependence: Secondary | ICD-10-CM

## 2022-06-24 DIAGNOSIS — D649 Anemia, unspecified: Secondary | ICD-10-CM | POA: Diagnosis present

## 2022-06-24 DIAGNOSIS — Z818 Family history of other mental and behavioral disorders: Secondary | ICD-10-CM

## 2022-06-24 DIAGNOSIS — F310 Bipolar disorder, current episode hypomanic: Secondary | ICD-10-CM | POA: Diagnosis not present

## 2022-06-24 LAB — CBC WITH DIFFERENTIAL/PLATELET
Abs Immature Granulocytes: 0.35 10*3/uL — ABNORMAL HIGH (ref 0.00–0.07)
Basophils Absolute: 0.1 10*3/uL (ref 0.0–0.1)
Basophils Relative: 1 %
Eosinophils Absolute: 0.1 10*3/uL (ref 0.0–0.5)
Eosinophils Relative: 1 %
HCT: 26.2 % — ABNORMAL LOW (ref 39.0–52.0)
Hemoglobin: 8.5 g/dL — ABNORMAL LOW (ref 13.0–17.0)
Immature Granulocytes: 2 %
Lymphocytes Relative: 19 %
Lymphs Abs: 2.8 10*3/uL (ref 0.7–4.0)
MCH: 31.3 pg (ref 26.0–34.0)
MCHC: 32.4 g/dL (ref 30.0–36.0)
MCV: 96.3 fL (ref 80.0–100.0)
Monocytes Absolute: 0.7 10*3/uL (ref 0.1–1.0)
Monocytes Relative: 5 %
Neutro Abs: 11.1 10*3/uL — ABNORMAL HIGH (ref 1.7–7.7)
Neutrophils Relative %: 72 %
Platelets: 76 10*3/uL — ABNORMAL LOW (ref 150–400)
RBC: 2.72 MIL/uL — ABNORMAL LOW (ref 4.22–5.81)
RDW: 15.1 % (ref 11.5–15.5)
WBC: 15.1 10*3/uL — ABNORMAL HIGH (ref 4.0–10.5)
nRBC: 0.2 % (ref 0.0–0.2)

## 2022-06-24 LAB — PROTIME-INR
INR: 1.3 — ABNORMAL HIGH (ref 0.8–1.2)
Prothrombin Time: 15.7 seconds — ABNORMAL HIGH (ref 11.4–15.2)

## 2022-06-24 LAB — CD4/CD8 (T-HELPER/T-SUPPRESSOR CELL)
CD4 absolute: 1521 /uL (ref 400–1790)
CD4%: 64 % (ref 33–65)
CD8 T Cell Abs: 601 /uL (ref 190–1000)
CD8tox: 25.28 % (ref 12–40)
Ratio: 2.53 (ref 1.0–3.0)
Total lymphocyte count: 2376 /uL (ref 1000–4000)

## 2022-06-24 LAB — COMPREHENSIVE METABOLIC PANEL
ALT: 15 U/L (ref 0–44)
AST: 13 U/L — ABNORMAL LOW (ref 15–41)
Albumin: 2.2 g/dL — ABNORMAL LOW (ref 3.5–5.0)
Alkaline Phosphatase: 123 U/L (ref 38–126)
Anion gap: 8 (ref 5–15)
BUN: 20 mg/dL (ref 6–20)
CO2: 30 mmol/L (ref 22–32)
Calcium: 8.3 mg/dL — ABNORMAL LOW (ref 8.9–10.3)
Chloride: 99 mmol/L (ref 98–111)
Creatinine, Ser: 1.43 mg/dL — ABNORMAL HIGH (ref 0.61–1.24)
GFR, Estimated: >60 mL/min (ref >60)
Glucose, Bld: 128 mg/dL — ABNORMAL HIGH (ref 70–99)
Potassium: 3 mmol/L — ABNORMAL LOW (ref 3.5–5.1)
Sodium: 137 mmol/L (ref 135–145)
Total Bilirubin: 0.8 mg/dL (ref 0.3–1.2)
Total Protein: 7.7 g/dL (ref 6.5–8.1)

## 2022-06-24 LAB — HEMOGLOBIN AND HEMATOCRIT, BLOOD
HCT: 27.2 % — ABNORMAL LOW (ref 39.0–52.0)
Hemoglobin: 8.5 g/dL — ABNORMAL LOW (ref 13.0–17.0)

## 2022-06-24 LAB — ETHANOL: Alcohol, Ethyl (B): <10 mg/dL (ref <10)

## 2022-06-24 LAB — POC OCCULT BLOOD, ED: Fecal Occult Bld: POSITIVE — AB

## 2022-06-24 LAB — MAGNESIUM: Magnesium: 2.2 mg/dL (ref 1.7–2.4)

## 2022-06-24 LAB — RESP PANEL BY RT-PCR (FLU A&B, COVID) ARPGX2
Influenza A by PCR: NEGATIVE
Influenza B by PCR: NEGATIVE
SARS Coronavirus 2 by RT PCR: NEGATIVE

## 2022-06-24 LAB — CBG MONITORING, ED: Glucose-Capillary: 96 mg/dL (ref 70–99)

## 2022-06-24 LAB — LACTIC ACID, PLASMA: Lactic Acid, Venous: 2 mmol/L (ref 0.5–1.9)

## 2022-06-24 LAB — APTT: aPTT: 28 seconds (ref 24–36)

## 2022-06-24 SURGERY — INCISION AND DRAINAGE, ABSCESS
Anesthesia: General

## 2022-06-24 MED ORDER — PROCHLORPERAZINE EDISYLATE 10 MG/2ML IJ SOLN: 10.0000 mg | Freq: Four times a day (QID) | INTRAMUSCULAR | Status: DC | PRN

## 2022-06-24 MED ORDER — METRONIDAZOLE 500 MG/100ML IV SOLN
500.0000 mg | Freq: Once | INTRAVENOUS | Status: AC
  Administered 2022-06-24: 500 mg via INTRAVENOUS
  Filled 2022-06-24: qty 100

## 2022-06-24 MED ORDER — LACTATED RINGERS IV BOLUS (SEPSIS)
1000.0000 mL | Freq: Once | INTRAVENOUS | Status: AC
  Administered 2022-06-24: 1000 mL via INTRAVENOUS

## 2022-06-24 MED ORDER — VANCOMYCIN HCL 1250 MG/250ML IV SOLN: 1250.0000 mg | Freq: Two times a day (BID) | INTRAVENOUS | Status: DC

## 2022-06-24 MED ORDER — ACETAMINOPHEN 650 MG RE SUPP: 650.0000 mg | Freq: Four times a day (QID) | RECTAL | Status: DC | PRN

## 2022-06-24 MED ORDER — THIAMINE MONONITRATE 100 MG PO TABS
100.0000 mg | ORAL_TABLET | Freq: Every day | ORAL | Status: DC
  Administered 2022-06-25 – 2022-07-08 (×14): 100 mg via ORAL
  Filled 2022-06-24 (×14): qty 1

## 2022-06-24 MED ORDER — ACETAMINOPHEN 325 MG PO TABS
650.0000 mg | ORAL_TABLET | Freq: Four times a day (QID) | ORAL | Status: DC | PRN
  Administered 2022-06-27: 650 mg via ORAL
  Filled 2022-06-24: qty 2

## 2022-06-24 MED ORDER — IOHEXOL 300 MG/ML  SOLN
100.0000 mL | Freq: Once | INTRAMUSCULAR | Status: AC | PRN
  Administered 2022-06-24: 100 mL via INTRAVENOUS

## 2022-06-24 MED ORDER — SODIUM CHLORIDE 0.9 % IV SOLN
2.0000 g | Freq: Three times a day (TID) | INTRAVENOUS | Status: DC
  Administered 2022-06-24 – 2022-06-28 (×11): 2 g via INTRAVENOUS
  Filled 2022-06-24 (×11): qty 12.5

## 2022-06-24 MED ORDER — SODIUM CHLORIDE 0.9 % IV SOLN
2.0000 g | Freq: Once | INTRAVENOUS | Status: AC
  Administered 2022-06-24: 2 g via INTRAVENOUS
  Filled 2022-06-24: qty 12.5

## 2022-06-24 MED ORDER — METRONIDAZOLE 500 MG/100ML IV SOLN
500.0000 mg | Freq: Two times a day (BID) | INTRAVENOUS | Status: DC
  Administered 2022-06-25 – 2022-06-28 (×7): 500 mg via INTRAVENOUS
  Filled 2022-06-24 (×7): qty 100

## 2022-06-24 MED ORDER — ADULT MULTIVITAMIN W/MINERALS CH
1.0000 | ORAL_TABLET | Freq: Every day | ORAL | Status: DC
  Administered 2022-06-25 – 2022-07-08 (×14): 1 via ORAL
  Filled 2022-06-24 (×14): qty 1

## 2022-06-24 MED ORDER — POTASSIUM CHLORIDE 20 MEQ PO PACK
40.0000 meq | PACK | Freq: Two times a day (BID) | ORAL | Status: AC
  Administered 2022-06-24 – 2022-06-25 (×2): 40 meq via ORAL
  Filled 2022-06-24 (×2): qty 2

## 2022-06-24 MED ORDER — POTASSIUM CHLORIDE 10 MEQ/100ML IV SOLN
10.0000 meq | Freq: Once | INTRAVENOUS | Status: AC
  Administered 2022-06-24: 10 meq via INTRAVENOUS
  Filled 2022-06-24: qty 100

## 2022-06-24 MED ORDER — VANCOMYCIN HCL IN DEXTROSE 1-5 GM/200ML-% IV SOLN
1000.0000 mg | Freq: Once | INTRAVENOUS | Status: AC
  Administered 2022-06-24: 1000 mg via INTRAVENOUS
  Filled 2022-06-24: qty 200

## 2022-06-24 MED ORDER — LORAZEPAM 2 MG/ML IJ SOLN: 1.0000 mg | INTRAMUSCULAR | Status: AC | PRN

## 2022-06-24 MED ORDER — CHLORHEXIDINE GLUCONATE CLOTH 2 % EX PADS
6.0000 | MEDICATED_PAD | Freq: Every day | CUTANEOUS | Status: DC
  Administered 2022-06-25: 6 via TOPICAL

## 2022-06-24 MED ORDER — LORAZEPAM 1 MG PO TABS: 1.0000 mg | ORAL_TABLET | ORAL | Status: AC | PRN

## 2022-06-24 MED ORDER — LACTATED RINGERS IV SOLN: INTRAVENOUS | Status: DC

## 2022-06-24 MED ORDER — PANTOPRAZOLE SODIUM 40 MG IV SOLR
40.0000 mg | INTRAVENOUS | Status: DC
  Administered 2022-06-24: 40 mg via INTRAVENOUS
  Filled 2022-06-24: qty 10

## 2022-06-24 MED ORDER — FOLIC ACID 1 MG PO TABS
1.0000 mg | ORAL_TABLET | Freq: Every day | ORAL | Status: DC
  Administered 2022-06-25 – 2022-07-08 (×14): 1 mg via ORAL
  Filled 2022-06-24 (×14): qty 1

## 2022-06-24 MED ORDER — THIAMINE HCL 100 MG/ML IJ SOLN: 100.0000 mg | Freq: Every day | INTRAMUSCULAR | Status: DC

## 2022-06-24 NOTE — Progress Notes (Signed)
PHARMACY -  BRIEF ANTIBIOTIC NOTE   Pharmacy has received consult(s) for vancomycin and cefepime from an ED provider.  The patient's profile has been reviewed for ht/wt/allergies/indication/available labs.    One time order(s) placed for vancomycin 1 g + cefepime 2 g  Further antibiotics/pharmacy consults should be ordered by admitting physician if indicated.                       Thank you,  Tawnya Crook, PharmD, BCPS Clinical Pharmacist 06/24/2022 2:52 PM

## 2022-06-24 NOTE — Progress Notes (Signed)
Patient arrived to 43 at 46. ED RN , Benjamine Mola told this RN she recheck patients temp rectally prior to arrival was 95. PT drowsy and oriented to self and place only. PT on 2 liters/min nasal cannula upon arrival from ED. This RN checked temp rectally upon patient arrival and patient was 94.5 rectally. Lennox Grumbles, NP notified immediately at 2010 and transfer orders placed for patient to go to stepdown for bair hugger. Report called to receiving RN in stepdown and patient transported to stepdown at 2045.

## 2022-06-24 NOTE — Progress Notes (Signed)
Pharmacy Antibiotic Note  [name] is a 36 y.o. male admitted on 06/24/2022. 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 Patient with chronic wounds within groin creases. Pharmacy has been consulted for vancomycin and cefepime dosing for cellulitis.  Patient's SCr 1.43 on admission, elevated above baseline < 1.0.  Plan: -Received vanc 1 g + cefepime 2 g in the ED -Based on current renal function (and weight 86 kg), expect vancomycin 1250 mg IV q12h -Cefepime 2 g IV q8h -Pharmacy to continue to follow renal function, cultures and clinical progress for dose adjustments and de-escalation as indicated     Temp (24hrs), Avg:95.4 F (35.2 C), Min:94.5 F (34.7 C), Max:96.7 F (35.9 C)  Recent Labs  Lab 06/24/22 1447  WBC 15.1*  CREATININE 1.43*  LATICACIDVEN 2.0*    CrCl cannot be calculated (Unknown ideal weight.).    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   Tawnya Crook, PharmD, BCPS Clinical Pharmacist 06/24/2022 9:11 PM

## 2022-06-24 NOTE — Consult Note (Signed)
Urology Consult   Physician requesting consult: Cherylann Ratel, DO  Reason for consult: Emphysematous cystitis and left pyelitis  History of Present Illness: [name] is a 36 y.o. with a past medical history significant for HIV, bipolar disorder, depression, PTSD, schizoaffective disorder, seizures and chronic buttocks wounds, hidradenitis suppurativa who presented to the ED today with altered mental status.  Of note, he was admitted in May 2023 with severe sepsis due to bilateral buttocks wounds and hidradenitis suppurativa.  He has chronic buttocks wounds that have been poorly managed.  He presented to the emergency department today with altered mental status.  History is limited from the patient.  There were no additional family members present.  Per chart review, he lives alone and is nonambulatory.  He does have a nurse to evaluate him earlier today and found him altered this morning.  Per chart review, it appears he was drinking alcohol and he was found sitting in his waste by the nurses.  He was taken the emergency department for further evaluation.  CT A/P 06/24/2022 demonstrated gas noted within the bladder lumen and a small focus in the urinary bladder wall as well as a small focus in the left collecting system.  There is no evidence of an organized fluid collection in the subcutaneous soft tissues in his perineum or scrotum to suggest Fournier's gangrene.  He denies pain.  He denies problems with voiding.  Past Medical History:  Diagnosis Date   Bipolar 1 disorder ([place])    Depression    Dizziness and giddiness 02/01/2016   Herpes genitalia    HIV disease ([place])    Hypertension    Hyponatremia    Hypothermia 08/24/2021   Migraine headache 02/01/2016   Peripheral neuropathy 10/01/2019   PTSD (post-traumatic stress disorder)    Schizoaffective disorder ([place])    Seizures ([place])     Past Surgical History:  Procedure Laterality Date   BACK SURGERY     BIOPSY  02/26/2021    Procedure: BIOPSY;  Surgeon: Otis Brace, MD;  Location: [facility];  Service: Gastroenterology;;   COLONOSCOPY WITH PROPOFOL [name] 02/26/2021   Procedure: COLONOSCOPY WITH PROPOFOL;  Surgeon: Otis Brace, MD;  Location: [facility];  Service: Gastroenterology;  Laterality: [name];   ESOPHAGOGASTRODUODENOSCOPY (EGD) WITH PROPOFOL [name] 02/26/2021   Procedure: ESOPHAGOGASTRODUODENOSCOPY (EGD) WITH PROPOFOL;  Surgeon: Otis Brace, MD;  Location: [facility];  Service: Gastroenterology;  Laterality: [name];   ESOPHAGOGASTRODUODENOSCOPY (EGD) WITH PROPOFOL [name] 05/18/2021   Procedure: ESOPHAGOGASTRODUODENOSCOPY (EGD) WITH PROPOFOL;  Surgeon: Otis Brace, MD;  Location: [facility];  Service: Gastroenterology;  Laterality: [name];   HAND SURGERY      Medications:  Home meds:  No current facility-administered medications on file prior to encounter.   Current Outpatient Medications on File Prior to Encounter  Medication Sig Dispense Refill   albuterol (VENTOLIN HFA) 108 (90 Base) MCG/ACT inhaler Inhale 1 puff into the lungs every 6 (six) hours as needed for wheezing or shortness of breath.     alprazolam (XANAX) 2 MG tablet Take 2 mg by mouth in the morning, at noon, and at bedtime.     escitalopram (LEXAPRO) 20 MG tablet Take 1 tablet by mouth daily.     feeding supplement (ENSURE ENLIVE / ENSURE PLUS) LIQD Take 237 mLs by mouth 2 (two) times daily between meals. 646 mL 12   folic acid (FOLVITE) 1 MG tablet Take 1 mg by mouth daily.     food thickener (SIMPLYTHICK, NECTAR/LEVEL 2/MILDLY THICK,) GEL Take 1  packet by mouth as needed. (Patient not taking: Reported on 02/13/2022) 10 packet 0   haloperidol (HALDOL) 5 MG tablet Take 5 mg by mouth daily.     hydrOXYzine (ATARAX) 50 MG tablet Take 50 mg by mouth every 6 (six) hours as needed for anxiety.     lactulose (CHRONULAC) 10 GM/15ML solution Take 30 mLs (20 g total) by mouth 2 (two) times daily. (Patient taking differently: Take 10 g by  mouth 2 (two) times daily.) 236 mL 0   linaclotide (LINZESS) 145 MCG CAPS capsule Take 1 capsule (145 mcg total) by mouth daily before breakfast. 60 capsule 0   Lurasidone HCl 120 MG TABS Take 1 tablet by mouth daily.     magnesium oxide (MAG-OX) 400 (240 Mg) MG tablet Take 1 tablet (400 mg total) by mouth daily. (Patient not taking: Reported on 02/13/2022) 30 tablet 0   midodrine (PROAMATINE) 5 MG tablet Take 1 tablet by mouth 3 times daily with meals. 90 tablet 0   Multiple Vitamin (MULTIVITAMIN WITH MINERALS) TABS tablet Take 1 tablet by mouth daily. 30 tablet 0   nicotine (NICODERM CQ - DOSED IN MG/24 HOURS) 14 mg/24hr patch Place 1 patch (14 mg total) onto the skin daily. (Patient not taking: Reported on 02/13/2022) 28 patch 0   pantoprazole (PROTONIX) 40 MG tablet Take 40 mg by mouth daily.     thiamine 100 MG tablet Take 100 mg by mouth daily.     trazodone (DESYREL) 300 MG tablet Take 300 mg by mouth daily as needed for sleep.     valACYclovir (VALTREX) 1000 MG tablet Take 1 tablet (1,000 mg total) by mouth daily.       Scheduled Meds:  potassium chloride  40 mEq Oral BID   Continuous Infusions:  lactated ringers     PRN Meds:.  Allergies:  Allergies  Allergen Reactions   Dapsone Other (See Comments)    Per [facility], "G6PD deficient"   Primaquine Phosphate Other (See Comments)    Per [facility], "G6PD deficient"    Family History  Problem Relation Age of Onset   Alcohol abuse Mother    Schizophrenia Father    Depression Father    Alcohol abuse Father    Alcohol abuse Paternal Uncle    Alcohol abuse Paternal Uncle     Social History:  reports that he has been smoking cigarettes. He has been smoking an average of 1 pack per day. He has never used smokeless tobacco. He reports current alcohol use of about 14.0 standard drinks of alcohol per week. He reports that he does not use drugs.  ROS: A complete review of systems was performed.  All systems are negative except for  pertinent findings as noted.  Physical Exam:  Vital signs in last 24 hours: Temp:  [96.7 F (35.9 C)] 96.7 F (35.9 C) (09/15 1504) Pulse Rate:  [56-119] 95 (09/15 1630) Resp:  [13-18] 16 (09/15 1630) BP: (100-120)/(78-87) 120/86 (09/15 1630) SpO2:  [81 %-100 %] 100 % (09/15 1630) Constitutional: Altered, answers questions yes or no Cardiovascular: Regular rate and rhythm Respiratory: Normal respiratory effort, Lungs clear bilaterally GI: Abdomen is soft, nontender, nondistended, no abdominal masses Genitourinary:  He has evidence of chronic hydradenitis over his suprapubic region as well as in his bilateral groin creases.  Some of these wounds are open draining minimal amount of old purulent material.  There are no lesions in his scrotum to suggest underlying abscess.  There is no fluctuance or induration in his  scrotum.  Bilateral testicles are small and nontender and without masses.  No fluctuant areas in his perineum. His penis is uncircumcised without tenderness and without overlying erythema.  Laboratory Data:  Recent Labs    06/24/22 1447  WBC 15.1*  HGB 8.5*  HCT 26.2*  PLT 76*    Recent Labs    06/24/22 1447  NA 137  K 3.0*  CL 99  GLUCOSE 128*  BUN 20  CALCIUM 8.3*  CREATININE 1.43*     Results for orders placed or performed during the hospital encounter of 06/24/22 (from the past 24 hour(s))  Lactic acid, plasma     Status: Abnormal   Collection Time: 06/24/22  2:47 PM  Result Value Ref Range   Lactic Acid, Venous 2.0 (HH) 0.5 - 1.9 mmol/L  Comprehensive metabolic panel     Status: Abnormal   Collection Time: 06/24/22  2:47 PM  Result Value Ref Range   Sodium 137 135 - 145 mmol/L   Potassium 3.0 (L) 3.5 - 5.1 mmol/L   Chloride 99 98 - 111 mmol/L   CO2 30 22 - 32 mmol/L   Glucose, Bld 128 (H) 70 - 99 mg/dL   BUN 20 6 - 20 mg/dL   Creatinine, Ser 1.43 (H) 0.61 - 1.24 mg/dL   Calcium 8.3 (L) 8.9 - 10.3 mg/dL   Total Protein 7.7 6.5 - 8.1 g/dL    Albumin 2.2 (L) 3.5 - 5.0 g/dL   AST 13 (L) 15 - 41 U/L   ALT 15 0 - 44 U/L   Alkaline Phosphatase 123 38 - 126 U/L   Total Bilirubin 0.8 0.3 - 1.2 mg/dL   GFR, Estimated >60 >60 mL/min   Anion gap 8 5 - 15  CBC with Differential     Status: Abnormal   Collection Time: 06/24/22  2:47 PM  Result Value Ref Range   WBC 15.1 (H) 4.0 - 10.5 K/uL   RBC 2.72 (L) 4.22 - 5.81 MIL/uL   Hemoglobin 8.5 (L) 13.0 - 17.0 g/dL   HCT 26.2 (L) 39.0 - 52.0 %   MCV 96.3 80.0 - 100.0 fL   MCH 31.3 26.0 - 34.0 pg   MCHC 32.4 30.0 - 36.0 g/dL   RDW 15.1 11.5 - 15.5 %   Platelets 76 (L) 150 - 400 K/uL   nRBC 0.2 0.0 - 0.2 %   Neutrophils Relative % 72 %   Neutro Abs 11.1 (H) 1.7 - 7.7 K/uL   Lymphocytes Relative 19 %   Lymphs Abs 2.8 0.7 - 4.0 K/uL   Monocytes Relative 5 %   Monocytes Absolute 0.7 0.1 - 1.0 K/uL   Eosinophils Relative 1 %   Eosinophils Absolute 0.1 0.0 - 0.5 K/uL   Basophils Relative 1 %   Basophils Absolute 0.1 0.0 - 0.1 K/uL   Immature Granulocytes 2 %   Abs Immature Granulocytes 0.35 (H) 0.00 - 0.07 K/uL  Protime-INR     Status: Abnormal   Collection Time: 06/24/22  2:47 PM  Result Value Ref Range   Prothrombin Time 15.7 (H) 11.4 - 15.2 seconds   INR 1.3 (H) 0.8 - 1.2  APTT     Status: None   Collection Time: 06/24/22  2:47 PM  Result Value Ref Range   aPTT 28 24 - 36 seconds  Cd4/cd8 (t-helper/t-suppressor cell)     Status: None   Collection Time: 06/24/22  2:47 PM  Result Value Ref Range   Total lymphocyte count 2,376 1,000 -  4,000 /uL   CD4% 64.00 33 - 65 %   CD4 absolute 1,521 400 - 1,790 /uL   CD8tox 25.28 12 - 40 %   CD8 T Cell Abs 601 190 - 1,000 /uL   Ratio 2.53 1.0 - 3.0  Magnesium     Status: None   Collection Time: 06/24/22  2:47 PM  Result Value Ref Range   Magnesium 2.2 1.7 - 2.4 mg/dL  CBG monitoring, ED     Status: None   Collection Time: 06/24/22  2:47 PM  Result Value Ref Range   Glucose-Capillary 96 70 - 99 mg/dL  Ethanol     Status: None    Collection Time: 06/24/22  2:48 PM  Result Value Ref Range   Alcohol, Ethyl (B) <10 <10 mg/dL  Resp Panel by RT-PCR (Flu A&B, Covid) Anterior Nasal Swab     Status: None   Collection Time: 06/24/22  2:58 PM   Specimen: Anterior Nasal Swab  Result Value Ref Range   SARS Coronavirus 2 by RT PCR NEGATIVE NEGATIVE   Influenza A by PCR NEGATIVE NEGATIVE   Influenza B by PCR NEGATIVE NEGATIVE  POC occult blood, ED Provider will collect     Status: Abnormal   Collection Time: 06/24/22  4:17 PM  Result Value Ref Range   Fecal Occult Bld POSITIVE (A) NEGATIVE   Recent Results (from the past 240 hour(s))  Resp Panel by RT-PCR (Flu A&B, Covid) Anterior Nasal Swab     Status: None   Collection Time: 06/24/22  2:58 PM   Specimen: Anterior Nasal Swab  Result Value Ref Range Status   SARS Coronavirus 2 by RT PCR NEGATIVE NEGATIVE Final    Comment: (NOTE) SARS-CoV-2 target nucleic acids are NOT DETECTED.  The SARS-CoV-2 RNA is generally detectable in upper respiratory specimens during the acute phase of infection. The lowest concentration of SARS-CoV-2 viral copies this assay can detect is 138 copies/mL. A negative result does not preclude SARS-Cov-2 infection and should not be used as the sole basis for treatment or other patient management decisions. A negative result may occur with  improper specimen collection/handling, submission of specimen other than nasopharyngeal swab, presence of viral mutation(s) within the areas targeted by this assay, and inadequate number of viral copies(<138 copies/mL). A negative result must be combined with clinical observations, patient history, and epidemiological information. The expected result is Negative.  Fact Sheet for Patients:  EntrepreneurPulse.com.au  Fact Sheet for Healthcare Providers:  IncredibleEmployment.be  This test is no t yet approved or cleared by the [place] FDA and  has been authorized for  detection and/or diagnosis of SARS-CoV-2 by FDA under an Emergency Use Authorization (EUA). This EUA will remain  in effect (meaning this test can be used) for the duration of the COVID-19 declaration under Section 564(b)(1) of the Act, 21 U.S.C.section 360bbb-3(b)(1), unless the authorization is terminated  or revoked sooner.       Influenza A by PCR NEGATIVE NEGATIVE Final   Influenza B by PCR NEGATIVE NEGATIVE Final    Comment: (NOTE) The Xpert Xpress SARS-CoV-2/FLU/RSV plus assay is intended as an aid in the diagnosis of influenza from Nasopharyngeal swab specimens and should not be used as a sole basis for treatment. Nasal washings and aspirates are unacceptable for Xpert Xpress SARS-CoV-2/FLU/RSV testing.  Fact Sheet for Patients: EntrepreneurPulse.com.au  Fact Sheet for Healthcare Providers: IncredibleEmployment.be  This test is not yet approved or cleared by the [place] FDA and has been authorized for detection  and/or diagnosis of SARS-CoV-2 by FDA under an Emergency Use Authorization (EUA). This EUA will remain in effect (meaning this test can be used) for the duration of the COVID-19 declaration under Section 564(b)(1) of the Act, 21 U.S.C. section 360bbb-3(b)(1), unless the authorization is terminated or revoked.  Performed at [facility], [place] [address], [place], [place] [address]     Renal Function: Recent Labs    06/24/22 1447  CREATININE 1.43*   CrCl cannot be calculated (Unknown ideal weight.).  Radiologic Imaging: CT ABDOMEN PELVIS W CONTRAST  Result Date: 06/24/2022 CLINICAL DATA:  Abdominal pain, acute, nonlocalized eval for fournier's gangrene EXAM: CT ABDOMEN AND PELVIS WITH CONTRAST TECHNIQUE: Multidetector CT imaging of the abdomen and pelvis was performed using the standard protocol following bolus administration of intravenous contrast. RADIATION DOSE REDUCTION: This exam was  performed according to the departmental dose-optimization program which includes automated exposure control, adjustment of the mA and/or kV according to patient size and/or use of iterative reconstruction technique. CONTRAST:  150m OMNIPAQUE IOHEXOL 300 MG/ML  SOLN COMPARISON:  None Available. FINDINGS: Lower chest: Tiny hiatal hernia.  No acute abnormality. Hepatobiliary: No focal liver abnormality. No gallstones, gallbladder wall thickening, or pericholecystic fluid. No biliary dilatation. Pancreas: No focal lesion. Normal pancreatic contour. No surrounding inflammatory changes. No main pancreatic ductal dilatation. Spleen: Normal in size without focal abnormality. Adrenals/Urinary Tract: No adrenal nodule bilaterally. Bilateral kidneys enhance symmetrically. No hydronephrosis. No hydroureter. Air-fluid level within the urinary bladder lumen. Query gas within the urinary bladder wall best evaluated on coronal imaging. Stomach/Bowel: Stomach is within normal limits. No evidence of bowel wall thickening or dilatation. Appendix appears normal. Vascular/Lymphatic: No abdominal aorta or iliac aneurysm. Mild atherosclerotic plaque of the aorta and its branches. No abdominal, pelvic, or inguinal lymphadenopathy. Reproductive: Prostate is unremarkable. Other: No intraperitoneal free fluid. No intraperitoneal free gas. No organized fluid collection. Musculoskeletal: No subcutaneus soft tissue edema, emphysema, or on ice fluid collection within the soft tissues. Irregular dermal thickening of the gluteal soft tissues noted. Small fat containing umbilical hernia with an abdominal defect of 1.5 cm. No suspicious lytic or blastic osseous lesions. No acute displaced fracture. Multilevel degenerative changes of the spine. IMPRESSION: 1. Tiny hiatal hernia. 2. Gas noted within the urinary bladder lumen, urinary bladder wall, and left renal findings suggestive of emphysematous cystitis and pyelitis. 3. Irregular dermal thickening  of the gluteal soft tissues. Correlate with physical exam for focal mass lesions versus cellulitis. 4. No organized fluid collection or subcutaneus soft tissue emphysema/edema to suggest Fournier's gangrene-however this cannot be fully excluded as this is a clinical diagnosis. 5.  Aortic Atherosclerosis (ICD10-I70.0). These results were called by telephone at the time of interpretation on 06/24/2022 at 5:40 pm to provider Dr. RWyvonnia Dusky who verbally acknowledged these results. Electronically Signed   By: MIven FinnM.D.   On: 06/24/2022 18:11   DG Chest Port 1 View  Result Date: 06/24/2022 CLINICAL DATA:  Altered mental status.  Possible sepsis. EXAM: PORTABLE CHEST 1 VIEW COMPARISON:  Chest x-ray dated Mar 04, 2022. FINDINGS: The heart size and mediastinal contours are within normal limits. Both lungs are clear. The visualized skeletal structures are unremarkable. IMPRESSION: No active disease. Electronically Signed   By: WTitus DubinM.D.   On: 06/24/2022 15:22   CT Head Wo Contrast  Result Date: 06/24/2022 CLINICAL DATA:  Altered mental status EXAM: CT HEAD WITHOUT CONTRAST TECHNIQUE: Contiguous axial images were obtained from the base of the skull through the  vertex without intravenous contrast. RADIATION DOSE REDUCTION: This exam was performed according to the departmental dose-optimization program which includes automated exposure control, adjustment of the mA and/or kV according to patient size and/or use of iterative reconstruction technique. COMPARISON:  Head CT 02/13/2022 FINDINGS: Brain: There is no acute intracranial hemorrhage, extra-axial fluid collection, or acute infarct Parenchymal volume is normal. The ventricles are normal in size. Gray-white differentiation is preserved. There is no mass lesion.  There is no mass effect or midline shift. Vascular: No hyperdense vessel or unexpected calcification. Skull: Normal. Negative for fracture or focal lesion. Sinuses/Orbits: The imaged  paranasal sinuses are clear. The globes and orbits are unremarkable. Other: None. IMPRESSION: No acute intracranial pathology. Electronically Signed   By: Valetta Mole M.D.   On: 06/24/2022 15:18    I independently reviewed the above imaging studies.  Impression/Recommendation Emphysematous cystitis Emphysematous left pyelitis Chronic hidradenitis suppurativa involving the groin creases, pubic region and buttocks Sepsis  -I reviewed his medical record and he has similar presentation in May 2023 with evidence of sepsis from chronic bilateral buttocks wounds and hidradenitis.  There is no concern for Fournier's gangrene on exam and rather, he has a small amount of chronic wounds within his groin creases. I reviewed CT imaging with no evidence of underlying abscess.  No need for incision and drainage of abscess. -He does have emphysematous cystitis and a small focus of gas in his left kidney consistent with emphysematous pyelitis however no hydronephrosis. -I recommend broad-spectrum antibiotics. -I recommend Foley catheter placement to allow for maximal decompression. -No plan for left ureteral stent placement presently as he has no hydronephrosis and I think this is an ascending infection. -If no improvement with broad-spectrum antibiotics, consider repeat imaging in 48-72 hours. -Following  Matt R. Naila Elizondo MD 06/24/2022, 7:38 PM  [facility]  Pager: [phone]   CC: yle Tyrone, DO

## 2022-06-24 NOTE — ED Triage Notes (Addendum)
Per EMS, patient from home where he lives alone, nurse that comes out daily found patient altered today. Wounds and skin breakdown to groin. Hx HIV. Per mother, drinks approximately 4-5 40z beers/day. C/o diarrhea x2 days and nausea/vomiting x1 day.   BP 86/40 HR 110 95% RA

## 2022-06-24 NOTE — Progress Notes (Signed)
RT NOTE:  RT attempted to obtain ABG x3 and was unsuccessful. Ordering provider aware.

## 2022-06-24 NOTE — ED Notes (Signed)
Pt transported to floor report given by previous shift.

## 2022-06-24 NOTE — H&P (Signed)
History and Physical    Patient: [name] XBJ:[id number] DOB: 09-06-1986 DOA: 06/24/2022 DOS: the patient was seen and examined on 06/24/2022 PCP: [name], MD  Patient coming from: Home  Chief Complaint:  Chief Complaint  Patient presents with   Altered Mental Status   HPI: [name] is a 36 y.o. male with medical history significant of Bipolar d/o, HIV, schizoaffective disorder, seizures, PTSD. Presenting with AMS. History is from chart review as the there is no family at bedside and attempted call to mother when unanswered. He apparently lives alone and is non-ambulatory. He has nurse who come and check on him. He was found to be altered this morning. It is believed that he has been drinking alcohol, but not taking in anything else. It was reported that he typically sits in his body wastes until seen by his home health nurses, and this was the state he was found in today. They became concerned and called for EMS.   Review of Systems: unable to review all systems due to the inability of the patient to answer questions. Past Medical History:  Diagnosis Date   Bipolar 1 disorder ([place])    Depression    Dizziness and giddiness 02/01/2016   Herpes genitalia    HIV disease ([place])    Hypertension    Hyponatremia    Hypothermia 08/24/2021   Migraine headache 02/01/2016   Peripheral neuropathy 10/01/2019   PTSD (post-traumatic stress disorder)    Schizoaffective disorder ([place])    Seizures ([place])    Past Surgical History:  Procedure Laterality Date   BACK SURGERY     BIOPSY  02/26/2021   Procedure: BIOPSY;  Surgeon: Otis Brace, MD;  Location: [facility];  Service: Gastroenterology;;   COLONOSCOPY WITH PROPOFOL [name] 02/26/2021   Procedure: COLONOSCOPY WITH PROPOFOL;  Surgeon: Otis Brace, MD;  Location: [facility];  Service: Gastroenterology;  Laterality: [name];   ESOPHAGOGASTRODUODENOSCOPY (EGD) WITH PROPOFOL [name] 02/26/2021   Procedure:  ESOPHAGOGASTRODUODENOSCOPY (EGD) WITH PROPOFOL;  Surgeon: Otis Brace, MD;  Location: [facility];  Service: Gastroenterology;  Laterality: [name];   ESOPHAGOGASTRODUODENOSCOPY (EGD) WITH PROPOFOL [name] 05/18/2021   Procedure: ESOPHAGOGASTRODUODENOSCOPY (EGD) WITH PROPOFOL;  Surgeon: Otis Brace, MD;  Location: [facility];  Service: Gastroenterology;  Laterality: [name];   HAND SURGERY     Social History:  reports that he has been smoking cigarettes. He has been smoking an average of 1 pack per day. He has never used smokeless tobacco. He reports current alcohol use of about 14.0 standard drinks of alcohol per week. He reports that he does not use drugs.  Allergies  Allergen Reactions   Dapsone Other (See Comments)    Per centricity "G6PD deficient"   Primaquine Phosphate Other (See Comments)    Per Centricity "G6PD deficient"    Family History  Problem Relation Age of Onset   Alcohol abuse Mother    Schizophrenia Father    Depression Father    Alcohol abuse Father    Alcohol abuse Paternal Uncle    Alcohol abuse Paternal Uncle     Prior to Admission medications   Medication Sig Start Date End Date Taking? Authorizing Provider  albuterol (VENTOLIN HFA) 108 (90 Base) MCG/ACT inhaler Inhale 1 puff into the lungs every 6 (six) hours as needed for wheezing or shortness of breath.    [provider]  alprazolam Duanne Moron) 2 MG tablet Take 2 mg by mouth in the morning, at noon, and at bedtime.    [provider]  escitalopram (LEXAPRO) 20 MG tablet Take 1 tablet by mouth daily. 02/16/21   [provider]  feeding supplement (ENSURE ENLIVE / ENSURE PLUS) LIQD Take 237 mLs by mouth 2 (two) times daily between meals. 03/04/22   Florencia Reasons, MD  folic acid (FOLVITE) 1 MG tablet Take 1 mg by mouth daily. 10/03/19   [provider]  food thickener (SIMPLYTHICK, NECTAR/LEVEL 2/MILDLY THICK,) GEL Take 1 packet by mouth as needed. Patient not taking: Reported on  02/13/2022 08/31/21   Raiford Noble Latif, DO  haloperidol (HALDOL) 5 MG tablet Take 5 mg by mouth daily.    [provider]  hydrOXYzine (ATARAX) 50 MG tablet Take 50 mg by mouth every 6 (six) hours as needed for anxiety.    [provider]  lactulose (CHRONULAC) 10 GM/15ML solution Take 30 mLs (20 g total) by mouth 2 (two) times daily. Patient taking differently: Take 10 g by mouth 2 (two) times daily. 08/31/21   Raiford Noble Latif, DO  linaclotide [facility]) 145 MCG CAPS capsule Take 1 capsule (145 mcg total) by mouth daily before breakfast. 03/02/22 05/02/22  Florencia Reasons, MD  Lurasidone HCl 120 MG TABS Take 1 tablet by mouth daily.    [provider]  magnesium oxide (MAG-OX) 400 (240 Mg) MG tablet Take 1 tablet (400 mg total) by mouth daily. Patient not taking: Reported on 02/13/2022 05/19/21   Geradine Girt, DO  midodrine (PROAMATINE) 5 MG tablet Take 1 tablet by mouth 3 times daily with meals. 03/04/22   Florencia Reasons, MD  Multiple Vitamin (MULTIVITAMIN WITH MINERALS) TABS tablet Take 1 tablet by mouth daily. 09/01/21   Sheikh, Omair Latif, DO  nicotine (NICODERM CQ - DOSED IN MG/24 HOURS) 14 mg/24hr patch Place 1 patch (14 mg total) onto the skin daily. Patient not taking: Reported on 02/13/2022 09/01/21   Raiford Noble Latif, DO  pantoprazole (PROTONIX) 40 MG tablet Take 40 mg by mouth daily.    [provider]  thiamine 100 MG tablet Take 100 mg by mouth daily.    [provider]  trazodone (DESYREL) 300 MG tablet Take 300 mg by mouth daily as needed for sleep.    [provider]  valACYclovir (VALTREX) 1000 MG tablet Take 1 tablet (1,000 mg total) by mouth daily. 05/15/15   Niel Hummer, NP    Physical Exam: Vitals:   06/24/22 1522 06/24/22 1545 06/24/22 1556 06/24/22 1616  BP: 107/78 108/87  106/81  Pulse: 95 (!) 56  67  Resp: 14 13  15   Temp:      TempSrc:      SpO2: 96% (!) 81% (!) 81% 100%   General: 36 y.o. male resting in bed in  NAD Eyes: PERRL, normal sclera ENMT: Nares patent w/o discharge, orophaynx clear, dentition normal, ears w/o discharge/lesions/ulcers, temporal wasting Neck: thin, trachea midline Cardiovascular: RRR, +S1, S2, no m/g/r, equal pulses throughout Respiratory: CTABL, no w/r/r, normal WOB GI: BS+, NDNT, no masses noted, no organomegaly noted, thin MSK: No e/c/c Skin: significant excoriation/breakdown in perineum Neuro: A&O x 3, generally weak, he is following some commands and weakly talking  Data Reviewed:  Results for orders placed or performed during the hospital encounter of 06/24/22 (from the past 24 hour(s))  Lactic acid, plasma     Status: Abnormal   Collection Time: 06/24/22  2:47 PM  Result Value Ref Range   Lactic Acid, Venous 2.0 (HH) 0.5 - 1.9 mmol/L  Comprehensive metabolic panel  Status: Abnormal   Collection Time: 06/24/22  2:47 PM  Result Value Ref Range   Sodium 137 135 - 145 mmol/L   Potassium 3.0 (L) 3.5 - 5.1 mmol/L   Chloride 99 98 - 111 mmol/L   CO2 30 22 - 32 mmol/L   Glucose, Bld 128 (H) 70 - 99 mg/dL   BUN 20 6 - 20 mg/dL   Creatinine, Ser 1.43 (H) 0.61 - 1.24 mg/dL   Calcium 8.3 (L) 8.9 - 10.3 mg/dL   Total Protein 7.7 6.5 - 8.1 g/dL   Albumin 2.2 (L) 3.5 - 5.0 g/dL   AST 13 (L) 15 - 41 U/L   ALT 15 0 - 44 U/L   Alkaline Phosphatase 123 38 - 126 U/L   Total Bilirubin 0.8 0.3 - 1.2 mg/dL   GFR, Estimated >60 >60 mL/min   Anion gap 8 5 - 15  CBC with Differential     Status: Abnormal   Collection Time: 06/24/22  2:47 PM  Result Value Ref Range   WBC 15.1 (H) 4.0 - 10.5 K/uL   RBC 2.72 (L) 4.22 - 5.81 MIL/uL   Hemoglobin 8.5 (L) 13.0 - 17.0 g/dL   HCT 26.2 (L) 39.0 - 52.0 %   MCV 96.3 80.0 - 100.0 fL   MCH 31.3 26.0 - 34.0 pg   MCHC 32.4 30.0 - 36.0 g/dL   RDW 15.1 11.5 - 15.5 %   Platelets 76 (L) 150 - 400 K/uL   nRBC 0.2 0.0 - 0.2 %   Neutrophils Relative % 72 %   Neutro Abs 11.1 (H) 1.7 - 7.7 K/uL   Lymphocytes Relative 19 %   Lymphs Abs  2.8 0.7 - 4.0 K/uL   Monocytes Relative 5 %   Monocytes Absolute 0.7 0.1 - 1.0 K/uL   Eosinophils Relative 1 %   Eosinophils Absolute 0.1 0.0 - 0.5 K/uL   Basophils Relative 1 %   Basophils Absolute 0.1 0.0 - 0.1 K/uL   Immature Granulocytes 2 %   Abs Immature Granulocytes 0.35 (H) 0.00 - 0.07 K/uL  Protime-INR     Status: Abnormal   Collection Time: 06/24/22  2:47 PM  Result Value Ref Range   Prothrombin Time 15.7 (H) 11.4 - 15.2 seconds   INR 1.3 (H) 0.8 - 1.2  APTT     Status: None   Collection Time: 06/24/22  2:47 PM  Result Value Ref Range   aPTT 28 24 - 36 seconds  Cd4/cd8 (t-helper/t-suppressor cell)     Status: None   Collection Time: 06/24/22  2:47 PM  Result Value Ref Range   Total lymphocyte count 2,376 1,000 - 4,000 /uL   CD4% 64.00 33 - 65 %   CD4 absolute 1,521 400 - 1,790 /uL   CD8tox 25.28 12 - 40 %   CD8 T Cell Abs 601 190 - 1,000 /uL   Ratio 2.53 1.0 - 3.0  Magnesium     Status: None   Collection Time: 06/24/22  2:47 PM  Result Value Ref Range   Magnesium 2.2 1.7 - 2.4 mg/dL  CBG monitoring, ED     Status: None   Collection Time: 06/24/22  2:47 PM  Result Value Ref Range   Glucose-Capillary 96 70 - 99 mg/dL  Ethanol     Status: None   Collection Time: 06/24/22  2:48 PM  Result Value Ref Range   Alcohol, Ethyl (B) <10 <10 mg/dL  POC occult blood, ED Provider will collect  Status: Abnormal   Collection Time: 06/24/22  4:17 PM  Result Value Ref Range   Fecal Occult Bld POSITIVE (A) NEGATIVE   CTH No acute intracranial pathology.  CXR: No active disease.  Assessment and Plan: Sepsis Cellulitis of groin     - admit to inpt, progressive     - source: possibly groin wound; checking CT ab/pelvis     - continue broad spec abx     - fluids     - follow bld cx, Ucx, lactic acid, procal   AKI     - fluids, follow CT, watch nephrotoxins  Acute metabolic encephalopathy     - secondary to infection, dehydration, EtOH abuse?     - CTH is negative      - mentation is starting to improve as he is following commands and speaking now, but is very weak  HIV     - continue home regimen when confirmed  Bipolar d/o Schizoaffective d/o     - continue home regimen when confirmed  Normocytic anemia Thrombocytopenia     - trend H&H     - he's FOBT positive; but he's at baseline (one outlier on 8/10: 12.7; otherwise Hgb is in 7 - 9 range)     - PPI  GERD     - PPI  Severe protein-calorie malnutrition     - dietitian consult  EtOH abuse     - CIWA, MVI, folate, thiamine     - fluids  Emphysematous cystitis Pyelitis     - CT shows emphysematous cystitis     - continue broad spec abx and place foley     - Urology consulted, appreciate assistance  Hypokalemia     - replace K+, Mg2+ is ok  Advance Care Planning:   Code Status: FULL  Consults: Urology (Dr. Abner Greenspan)  Family Communication: Attempted called to mother, but went to VM.   Severity of Illness: The appropriate patient status for this patient is INPATIENT. Inpatient status is judged to be reasonable and necessary in order to provide the required intensity of service to ensure the patient's safety. The patient's presenting symptoms, physical exam findings, and initial radiographic and laboratory data in the context of their chronic comorbidities is felt to place them at high risk for further clinical deterioration. Furthermore, it is not anticipated that the patient will be medically stable for discharge from the hospital within 2 midnights of admission.   * I certify that at the point of admission it is my clinical judgment that the patient will require inpatient hospital care spanning beyond 2 midnights from the point of admission due to high intensity of service, high risk for further deterioration and high frequency of surveillance required.*  Time spent in coordination of this H&P: 60 minutes   Author: Jonnie Finner, DO 06/24/2022 4:26 PM  For on call review  [email or web address].

## 2022-06-24 NOTE — Sepsis Progress Note (Signed)
eLink is following this Code Sepsis. °

## 2022-06-24 NOTE — ED Provider Notes (Signed)
[facility] DEPT Provider Note   CSN: [id number] Arrival date & time: 06/24/22  1435     History  Chief Complaint  Patient presents with   Altered Mental Status    [name] is a 36 y.o. male.  Pt is a 36 yo male with pmhx significant for HIV, bipolar d/o, depression, PTSD, schizoaffective d/o, and seizures.  He is unable to give any hx.  Pt lives alone, but is not ambulatory.  He sits in a wheelchair all day.  Apparently, he has nurses that come out daily to check on him.  He was "normal" yesterday per EMS.  Today, the nurse came out and found him altered.  EMS said he was sitting in urine and feces in the wheelchair.  EMS said he's been drinking alcohol, but has not been eating or drinking anything else.  His home living conditions were deplorable.  He is responsive to verbal commands, however, he is not talking.  It is unclear if he's been taking his HIV meds.  EMS did note extensive skin breakdown in his groin area.       Home Medications Prior to Admission medications   Medication Sig Start Date End Date Taking? Authorizing Provider  albuterol (VENTOLIN HFA) 108 (90 Base) MCG/ACT inhaler Inhale 1 puff into the lungs every 6 (six) hours as needed for wheezing or shortness of breath.    [provider]  alprazolam Duanne Moron) 2 MG tablet Take 2 mg by mouth in the morning, at noon, and at bedtime.    [provider]  escitalopram (LEXAPRO) 20 MG tablet Take 1 tablet by mouth daily. 02/16/21   [provider]  feeding supplement (ENSURE ENLIVE / ENSURE PLUS) LIQD Take 237 mLs by mouth 2 (two) times daily between meals. 03/04/22   Florencia Reasons, MD  folic acid (FOLVITE) 1 MG tablet Take 1 mg by mouth daily. 10/03/19   [provider]  food thickener (SIMPLYTHICK, NECTAR/LEVEL 2/MILDLY THICK,) GEL Take 1 packet by mouth as needed. Patient not taking: Reported on 02/13/2022 08/31/21   Raiford Noble Latif, DO  haloperidol  (HALDOL) 5 MG tablet Take 5 mg by mouth daily.    [provider]  hydrOXYzine (ATARAX) 50 MG tablet Take 50 mg by mouth every 6 (six) hours as needed for anxiety.    [provider]  lactulose (CHRONULAC) 10 GM/15ML solution Take 30 mLs (20 g total) by mouth 2 (two) times daily. Patient taking differently: Take 10 g by mouth 2 (two) times daily. 08/31/21   Raiford Noble Latif, DO  linaclotide [facility]) 145 MCG CAPS capsule Take 1 capsule (145 mcg total) by mouth daily before breakfast. 03/02/22 05/02/22  Florencia Reasons, MD  Lurasidone HCl 120 MG TABS Take 1 tablet by mouth daily.    [provider]  magnesium oxide (MAG-OX) 400 (240 Mg) MG tablet Take 1 tablet (400 mg total) by mouth daily. Patient not taking: Reported on 02/13/2022 05/19/21   Geradine Girt, DO  midodrine (PROAMATINE) 5 MG tablet Take 1 tablet by mouth 3 times daily with meals. 03/04/22   Florencia Reasons, MD  Multiple Vitamin (MULTIVITAMIN WITH MINERALS) TABS tablet Take 1 tablet by mouth daily. 09/01/21   Sheikh, Omair Latif, DO  nicotine (NICODERM CQ - DOSED IN MG/24 HOURS) 14 mg/24hr patch Place 1 patch (14 mg total) onto the skin daily. Patient not taking: Reported on 02/13/2022 09/01/21   Raiford Noble Latif, DO  pantoprazole (PROTONIX) 40 MG tablet  Take 40 mg by mouth daily.    [provider]  thiamine 100 MG tablet Take 100 mg by mouth daily.    [provider]  trazodone (DESYREL) 300 MG tablet Take 300 mg by mouth daily as needed for sleep.    [provider]  valACYclovir (VALTREX) 1000 MG tablet Take 1 tablet (1,000 mg total) by mouth daily. 05/15/15   Niel Hummer, NP      Allergies    Dapsone and Primaquine phosphate    Review of Systems   Review of Systems  Unable to perform ROS: Mental status change    Physical Exam Updated Vital Signs BP 106/81   Pulse 67   Temp (!) 96.7 F (35.9 C) (Rectal)   Resp 15   SpO2 100%  Physical Exam Vitals and nursing note reviewed.   Constitutional:      Appearance: Normal appearance. He is ill-appearing.     Comments: Pt looks much older than stated age  HENT:     Head: Normocephalic and atraumatic.     Right Ear: External ear normal.     Left Ear: External ear normal.     Nose: Nose normal.     Mouth/Throat:     Mouth: Mucous membranes are dry.  Eyes:     Extraocular Movements: Extraocular movements intact.     Conjunctiva/sclera: Conjunctivae normal.     Pupils: Pupils are equal, round, and reactive to light.  Cardiovascular:     Rate and Rhythm: Regular rhythm. Tachycardia present.     Pulses: Normal pulses.     Heart sounds: Normal heart sounds.  Pulmonary:     Effort: Pulmonary effort is normal.     Breath sounds: Normal breath sounds.  Abdominal:     General: Abdomen is flat. Bowel sounds are normal.     Palpations: Abdomen is soft.  Genitourinary:    Rectum: Guaiac result positive.     Comments: Stool is yellowish.  I suspect stool was guaiac + from the skin breakdown. Musculoskeletal:     Cervical back: Normal range of motion and neck supple.     Right lower leg: Edema present.     Left lower leg: Edema present.  Skin:    Capillary Refill: Capillary refill takes less than 2 seconds.     Comments: Skin excoriation to his groin area.  No abscess to drain.  Neurological:     Mental Status: He is alert.     Comments: Pt will follow simple commands.  He is not taking to me.    Psychiatric:        Mood and Affect: Affect is flat.     ED Results / Procedures / Treatments   Labs (all labs ordered are listed, but only abnormal results are displayed) Labs Reviewed  LACTIC ACID, PLASMA - Abnormal; Notable for the following components:      Result Value   Lactic Acid, Venous 2.0 (*)    All other components within normal limits  COMPREHENSIVE METABOLIC PANEL - Abnormal; Notable for the following components:   Potassium 3.0 (*)    Glucose, Bld 128 (*)    Creatinine, Ser 1.43 (*)    Calcium 8.3  (*)    Albumin 2.2 (*)    AST 13 (*)    All other components within normal limits  CBC WITH DIFFERENTIAL/PLATELET - Abnormal; Notable for the following components:   WBC 15.1 (*)    RBC 2.72 (*)    Hemoglobin  8.5 (*)    HCT 26.2 (*)    Platelets 76 (*)    Neutro Abs 11.1 (*)    Abs Immature Granulocytes 0.35 (*)    All other components within normal limits  PROTIME-INR - Abnormal; Notable for the following components:   Prothrombin Time 15.7 (*)    INR 1.3 (*)    All other components within normal limits  POC OCCULT BLOOD, ED - Abnormal; Notable for the following components:   Fecal Occult Bld POSITIVE (*)    All other components within normal limits  RESP PANEL BY RT-PCR (FLU A&B, COVID) ARPGX2  CULTURE, BLOOD (ROUTINE X 2)  CULTURE, BLOOD (ROUTINE X 2)  URINE CULTURE  APTT  ETHANOL  CD4/CD8 (T-HELPER/T-SUPPRESSOR CELL)  MAGNESIUM  LACTIC ACID, PLASMA  URINALYSIS, ROUTINE W REFLEX MICROSCOPIC  RAPID URINE DRUG SCREEN, HOSP PERFORMED  HIV-1 RNA QUANT-NO REFLEX-BLD  CBG MONITORING, ED  TYPE AND SCREEN    EKG EKG Interpretation  Date/Time:  Friday June 24 2022 14:53:58 EDT Ventricular Rate:  103 PR Interval:  134 QRS Duration: 74 QT Interval:  490 QTC Calculation: 642 R Axis:   53 Text Interpretation: Sinus tachycardia Borderline low voltage, extremity leads Abnormal R-wave progression, early transition Abnormal T, consider ischemia, anterior leads Prolonged QT interval Since last tracing rate faster Confirmed by Isla Pence [id number]) on 06/24/2022 2:57:23 PM  Radiology DG Chest Port 1 View  Result Date: 06/24/2022 CLINICAL DATA:  Altered mental status.  Possible sepsis. EXAM: PORTABLE CHEST 1 VIEW COMPARISON:  Chest x-ray dated Mar 04, 2022. FINDINGS: The heart size and mediastinal contours are within normal limits. Both lungs are clear. The visualized skeletal structures are unremarkable. IMPRESSION: No active disease. Electronically Signed   By: Titus Dubin M.D.   On: 06/24/2022 15:22   CT Head Wo Contrast  Result Date: 06/24/2022 CLINICAL DATA:  Altered mental status EXAM: CT HEAD WITHOUT CONTRAST TECHNIQUE: Contiguous axial images were obtained from the base of the skull through the vertex without intravenous contrast. RADIATION DOSE REDUCTION: This exam was performed according to the departmental dose-optimization program which includes automated exposure control, adjustment of the mA and/or kV according to patient size and/or use of iterative reconstruction technique. COMPARISON:  Head CT 02/13/2022 FINDINGS: Brain: There is no acute intracranial hemorrhage, extra-axial fluid collection, or acute infarct Parenchymal volume is normal. The ventricles are normal in size. Gray-white differentiation is preserved. There is no mass lesion.  There is no mass effect or midline shift. Vascular: No hyperdense vessel or unexpected calcification. Skull: Normal. Negative for fracture or focal lesion. Sinuses/Orbits: The imaged paranasal sinuses are clear. The globes and orbits are unremarkable. Other: None. IMPRESSION: No acute intracranial pathology. Electronically Signed   By: Valetta Mole M.D.   On: 06/24/2022 15:18    Procedures Procedures    Medications Ordered in ED Medications  lactated ringers infusion (has no administration in time range)  lactated ringers bolus 1,000 mL (1,000 mLs Intravenous New Bag/Given 06/24/22 1608)    And  lactated ringers bolus 1,000 mL (1,000 mLs Intravenous New Bag/Given 06/24/22 1616)    And  lactated ringers bolus 1,000 mL (has no administration in time range)  metroNIDAZOLE (FLAGYL) IVPB 500 mg (has no administration in time range)  potassium chloride 10 mEq in 100 mL IVPB (has no administration in time range)  ceFEPIme (MAXIPIME) 2 g in sodium chloride 0.9 % 100 mL IVPB (2 g Intravenous New Bag/Given 06/24/22 1527)  vancomycin (VANCOCIN) IVPB 1000 mg/200 mL  premix (1,000 mg Intravenous New Bag/Given 06/24/22 1529)   iohexol (OMNIPAQUE) 300 MG/ML solution 100 mL (100 mLs Intravenous Contrast Given 06/24/22 1654)    ED Course/ Medical Decision Making/ A&P                           Medical Decision Making Amount and/or Complexity of Data Reviewed Labs: ordered. Radiology: ordered. ECG/medicine tests: ordered.  Risk Prescription drug management. Decision regarding hospitalization.   This patient presents to the ED for concern of ams, this involves an extensive number of treatment options, and is a complaint that carries with it a high risk of complications and morbidity.  The differential diagnosis includes sepsis, infection, trauma   Co morbidities that complicate the patient evaluation  HIV, bipolar d/o, depression, PTSD, schizoaffective d/o, and seizures   Additional history obtained:  Additional history obtained from epic chart review External records from outside source obtained and reviewed including EMS report   Lab Tests:  I Ordered, and personally interpreted labs.  The pertinent results include:  cbc with wbc 15.1, hgb 8.5, plt 76; cmp with k 3.0, cr elevated at 1.43 (cr nl 1 mo ago); inr 1.3; cd4 ok; lactic elevated at 2; sars/flu neg; etoh neg   Imaging Studies ordered:  I ordered imaging studies including cxr and ct head and ct abd/pelvis I independently visualized and interpreted imaging which showed  CXR: IMPRESSION:  No active disease.  CT head: IMPRESSION:  No acute intracranial pathology.  Ct abd/pelvis pending upon admission I agree with the radiologist interpretation   Cardiac Monitoring:  The patient was maintained on a cardiac monitor.  I personally viewed and interpreted the cardiac monitored which showed an underlying rhythm of: sinus tachy   Medicines ordered and prescription drug management:  I ordered medication including ivfs and iv abx  for sepsis  Reevaluation of the patient after these medicines showed that the patient improved I have  reviewed the patients home medicines and have made adjustments as needed   Test Considered:  ct   Critical Interventions:  Ivfs/iv abx   Consultations Obtained:  I requested consultation with the hospitalist (Dr. Marylyn Ishihara),  and discussed lab and imaging findings as well as pertinent plan - he will admit   Problem List / ED Course:  Sepsis:  likely from cellulitis in groin region.  CT abd/pelvis is pending.  There was an O2 sat documented at 81%.  This is incorrect.  O2 sats in the upper 90s.  CXR is clear and no covid/flu. Guaiac + stool:  likely from skin breakdown as opposed to gi bleed Anemia:  type and screen sent Hypokalemia:  pt given K.  Mg nl.   Reevaluation:  After the interventions noted above, I reevaluated the patient and found that they have :improved   Social Determinants of Health:  Lives alone.  Has a home health aide daily.   Dispostion:  After consideration of the diagnostic results and the patients response to treatment, I feel that the patent would benefit from admission.    CRITICAL CARE Performed by: Isla Pence   Total critical care time: 45 minutes  Critical care time was exclusive of separately billable procedures and treating other patients.  Critical care was necessary to treat or prevent imminent or life-threatening deterioration.  Critical care was time spent personally by me on the following activities: development of treatment plan with patient and/or surrogate as well as nursing, discussions with consultants, evaluation  of patient's response to treatment, examination of patient, obtaining history from patient or surrogate, ordering and performing treatments and interventions, ordering and review of laboratory studies, ordering and review of radiographic studies, pulse oximetry and re-evaluation of patient's condition.         Final Clinical Impression(s) / ED Diagnoses Final diagnoses:  Sepsis with acute renal failure without  septic shock, due to unspecified organism, unspecified acute renal failure type ([place])  Dehydration  HIV disease ([place])  Metabolic encephalopathy    Rx / DC Orders ED Discharge Orders     None         Isla Pence, MD 06/24/22 1700

## 2022-06-25 DIAGNOSIS — D510 Vitamin B12 deficiency anemia due to intrinsic factor deficiency: Secondary | ICD-10-CM

## 2022-06-25 DIAGNOSIS — D696 Thrombocytopenia, unspecified: Secondary | ICD-10-CM

## 2022-06-25 DIAGNOSIS — L03314 Cellulitis of groin: Secondary | ICD-10-CM

## 2022-06-25 DIAGNOSIS — G9341 Metabolic encephalopathy: Secondary | ICD-10-CM

## 2022-06-25 DIAGNOSIS — B20 Human immunodeficiency virus [HIV] disease: Secondary | ICD-10-CM

## 2022-06-25 DIAGNOSIS — A419 Sepsis, unspecified organism: Secondary | ICD-10-CM | POA: Diagnosis not present

## 2022-06-25 DIAGNOSIS — F310 Bipolar disorder, current episode hypomanic: Secondary | ICD-10-CM

## 2022-06-25 DIAGNOSIS — N3080 Other cystitis without hematuria: Secondary | ICD-10-CM

## 2022-06-25 DIAGNOSIS — E43 Unspecified severe protein-calorie malnutrition: Secondary | ICD-10-CM

## 2022-06-25 DIAGNOSIS — N179 Acute kidney failure, unspecified: Secondary | ICD-10-CM

## 2022-06-25 DIAGNOSIS — F1090 Alcohol use, unspecified, uncomplicated: Secondary | ICD-10-CM

## 2022-06-25 DIAGNOSIS — F251 Schizoaffective disorder, depressive type: Secondary | ICD-10-CM

## 2022-06-25 DIAGNOSIS — K2100 Gastro-esophageal reflux disease with esophagitis, without bleeding: Secondary | ICD-10-CM

## 2022-06-25 LAB — HIV-1 RNA QUANT-NO REFLEX-BLD
HIV 1 RNA Quant: <20 copies/mL
LOG10 HIV-1 RNA: UNDETERMINED log10copy/mL

## 2022-06-25 LAB — CBC
HCT: 22.1 % — ABNORMAL LOW (ref 39.0–52.0)
Hemoglobin: 7.2 g/dL — ABNORMAL LOW (ref 13.0–17.0)
MCH: 31 pg (ref 26.0–34.0)
MCHC: 32.6 g/dL (ref 30.0–36.0)
MCV: 95.3 fL (ref 80.0–100.0)
Platelets: 66 10*3/uL — ABNORMAL LOW (ref 150–400)
RBC: 2.32 MIL/uL — ABNORMAL LOW (ref 4.22–5.81)
RDW: 14.7 % (ref 11.5–15.5)
WBC: 12.4 10*3/uL — ABNORMAL HIGH (ref 4.0–10.5)
nRBC: 0.3 % — ABNORMAL HIGH (ref 0.0–0.2)

## 2022-06-25 LAB — IRON AND TIBC
Iron: 50 ug/dL (ref 45–182)
Saturation Ratios: 47 % — ABNORMAL HIGH (ref 17.9–39.5)
TIBC: 106 ug/dL — ABNORMAL LOW (ref 250–450)
UIBC: 56 ug/dL

## 2022-06-25 LAB — COMPREHENSIVE METABOLIC PANEL
ALT: 12 U/L (ref 0–44)
AST: 13 U/L — ABNORMAL LOW (ref 15–41)
Albumin: 1.7 g/dL — ABNORMAL LOW (ref 3.5–5.0)
Alkaline Phosphatase: 91 U/L (ref 38–126)
Anion gap: 7 (ref 5–15)
BUN: 14 mg/dL (ref 6–20)
CO2: 27 mmol/L (ref 22–32)
Calcium: 7.7 mg/dL — ABNORMAL LOW (ref 8.9–10.3)
Chloride: 102 mmol/L (ref 98–111)
Creatinine, Ser: 1.08 mg/dL (ref 0.61–1.24)
GFR, Estimated: >60 mL/min (ref >60)
Glucose, Bld: 92 mg/dL (ref 70–99)
Potassium: 3.3 mmol/L — ABNORMAL LOW (ref 3.5–5.1)
Sodium: 136 mmol/L (ref 135–145)
Total Bilirubin: 0.8 mg/dL (ref 0.3–1.2)
Total Protein: 6.1 g/dL — ABNORMAL LOW (ref 6.5–8.1)

## 2022-06-25 LAB — VITAMIN B12: Vitamin B-12: 452 pg/mL (ref 180–914)

## 2022-06-25 LAB — MRSA NEXT GEN BY PCR, NASAL: MRSA by PCR Next Gen: DETECTED — AB

## 2022-06-25 LAB — RETICULOCYTES
Immature Retic Fract: 9.9 % (ref 2.3–15.9)
RBC.: 2.28 MIL/uL — ABNORMAL LOW (ref 4.22–5.81)
Retic Count, Absolute: 17.3 10*3/uL — ABNORMAL LOW (ref 19.0–186.0)
Retic Ct Pct: 0.8 % (ref 0.4–3.1)

## 2022-06-25 LAB — PROCALCITONIN: Procalcitonin: 0.6 ng/mL

## 2022-06-25 LAB — PROTIME-INR
INR: 1.3 — ABNORMAL HIGH (ref 0.8–1.2)
Prothrombin Time: 16.1 seconds — ABNORMAL HIGH (ref 11.4–15.2)

## 2022-06-25 LAB — FERRITIN: Ferritin: 386 ng/mL — ABNORMAL HIGH (ref 24–336)

## 2022-06-25 LAB — FOLATE: Folate: 11.2 ng/mL (ref >5.9)

## 2022-06-25 LAB — CORTISOL-AM, BLOOD: Cortisol - AM: 19.7 ug/dL (ref 6.7–22.6)

## 2022-06-25 MED ORDER — VANCOMYCIN HCL IN DEXTROSE 1-5 GM/200ML-% IV SOLN
1000.0000 mg | Freq: Two times a day (BID) | INTRAVENOUS | Status: DC
  Administered 2022-06-25: 1000 mg via INTRAVENOUS
  Filled 2022-06-25: qty 200

## 2022-06-25 MED ORDER — MUPIROCIN 2 % EX OINT
1.0000 | TOPICAL_OINTMENT | Freq: Two times a day (BID) | CUTANEOUS | Status: AC
  Administered 2022-06-25 – 2022-06-30 (×10): 1 via NASAL
  Filled 2022-06-25: qty 22

## 2022-06-25 MED ORDER — LURASIDONE HCL 120 MG PO TABS: 1.0000 | ORAL_TABLET | Freq: Every day | ORAL | Status: DC

## 2022-06-25 MED ORDER — BICTEGRAVIR-EMTRICITAB-TENOFOV 50-200-25 MG PO TABS
1.0000 | ORAL_TABLET | Freq: Every day | ORAL | Status: DC
  Administered 2022-06-25 – 2022-07-08 (×14): 1 via ORAL
  Filled 2022-06-25 (×15): qty 1

## 2022-06-25 MED ORDER — MIDODRINE HCL 5 MG PO TABS
5.0000 mg | ORAL_TABLET | Freq: Three times a day (TID) | ORAL | Status: DC
  Administered 2022-06-25 (×2): 5 mg via ORAL
  Filled 2022-06-25 (×2): qty 1

## 2022-06-25 MED ORDER — SODIUM CHLORIDE 0.9 % IV BOLUS
1000.0000 mL | Freq: Once | INTRAVENOUS | Status: AC
  Administered 2022-06-25: 1000 mL via INTRAVENOUS

## 2022-06-25 MED ORDER — PANTOPRAZOLE SODIUM 40 MG PO TBEC
40.0000 mg | DELAYED_RELEASE_TABLET | Freq: Every day | ORAL | Status: DC
  Administered 2022-06-25 – 2022-07-07 (×13): 40 mg via ORAL
  Filled 2022-06-25 (×13): qty 1

## 2022-06-25 MED ORDER — MIDODRINE HCL 5 MG PO TABS
10.0000 mg | ORAL_TABLET | Freq: Three times a day (TID) | ORAL | Status: DC
  Administered 2022-06-25: 10 mg via ORAL
  Filled 2022-06-25: qty 2

## 2022-06-25 MED ORDER — LURASIDONE HCL 40 MG PO TABS
120.0000 mg | ORAL_TABLET | Freq: Every day | ORAL | Status: DC
  Administered 2022-06-25 – 2022-07-08 (×14): 120 mg via ORAL
  Filled 2022-06-25 (×15): qty 3

## 2022-06-25 MED ORDER — ORAL CARE MOUTH RINSE: 15.0000 mL | OROMUCOSAL | Status: DC | PRN

## 2022-06-25 MED ORDER — CHLORHEXIDINE GLUCONATE CLOTH 2 % EX PADS
6.0000 | MEDICATED_PAD | Freq: Every day | CUTANEOUS | Status: DC
  Administered 2022-06-26 – 2022-07-08 (×10): 6 via TOPICAL

## 2022-06-25 MED ORDER — ENSURE ENLIVE PO LIQD
237.0000 mL | Freq: Three times a day (TID) | ORAL | Status: DC
  Administered 2022-06-26 – 2022-07-08 (×22): 237 mL via ORAL

## 2022-06-25 MED ORDER — NYSTATIN 100000 UNIT/GM EX POWD
Freq: Two times a day (BID) | CUTANEOUS | Status: DC
  Filled 2022-06-25: qty 15

## 2022-06-25 MED ORDER — VALACYCLOVIR HCL 500 MG PO TABS: 1000.0000 mg | ORAL_TABLET | Freq: Every day | ORAL | Status: DC

## 2022-06-25 MED ORDER — VANCOMYCIN HCL 1250 MG/250ML IV SOLN
1250.0000 mg | Freq: Two times a day (BID) | INTRAVENOUS | Status: DC
  Administered 2022-06-25 – 2022-06-28 (×6): 1250 mg via INTRAVENOUS
  Filled 2022-06-25 (×7): qty 250

## 2022-06-25 MED ORDER — HALOPERIDOL 5 MG PO TABS
5.0000 mg | ORAL_TABLET | Freq: Every day | ORAL | Status: DC
  Administered 2022-06-25 – 2022-07-07 (×13): 5 mg via ORAL
  Filled 2022-06-25: qty 5
  Filled 2022-06-25 (×2): qty 1
  Filled 2022-06-25 (×2): qty 5
  Filled 2022-06-25 (×3): qty 1
  Filled 2022-06-25: qty 5
  Filled 2022-06-25 (×5): qty 1

## 2022-06-25 MED ORDER — ESCITALOPRAM OXALATE 20 MG PO TABS
20.0000 mg | ORAL_TABLET | Freq: Every day | ORAL | Status: DC
  Administered 2022-06-25 – 2022-07-08 (×14): 20 mg via ORAL
  Filled 2022-06-25 (×15): qty 1

## 2022-06-25 MED ORDER — VALACYCLOVIR HCL 500 MG PO TABS
1000.0000 mg | ORAL_TABLET | Freq: Every day | ORAL | Status: DC
  Administered 2022-06-25 – 2022-07-08 (×14): 1000 mg via ORAL
  Filled 2022-06-25 (×14): qty 2

## 2022-06-25 MED ORDER — LACTATED RINGERS IV SOLN: INTRAVENOUS | Status: DC

## 2022-06-25 NOTE — Progress Notes (Signed)
1 Day Post-Op Subjective: Looks much better this morning.  Awake alert, answering questions appropriately.  Tolerating diet.  Denies pain.  Afebrile  Objective: Vital signs in last 24 hours: Temp:  [94.5 F (34.7 C)-98.7 F (37.1 C)] 97.9 F (36.6 C) (09/16 0755) Pulse Rate:  [56-119] 104 (09/16 0900) Resp:  [11-21] 15 (09/16 1011) BP: (85-120)/(45-87) 96/62 (09/16 1002) SpO2:  [81 %-100 %] 100 % (09/16 0900) Weight:  [81.1 kg] 81.1 kg (09/15 2200)  Intake/Output from previous day: 09/15 0701 - 09/16 0700 In: -  Out: 300 [Urine:300] Intake/Output this shift: Total I/O In: 1485.4 [I.V.:1000; IV Piggyback:485.4] Out: -  UOP: 300 mL, poorly recorded  Physical Exam:  General: Alert and oriented CV: RRR Lungs: Clear Abdomen: Soft, ND, nontender  GU: Chronic hidradenitis over suprapubic region and bilateral groin creases.  No areas of fluctuance, induration, necrosis over scrotum or perineum. Ext: NT, No erythema  Lab Results: Recent Labs    06/24/22 1447 06/24/22 2154 06/25/22 0257  HGB 8.5* 8.5* 7.2*  HCT 26.2* 27.2* 22.1*   BMET Recent Labs    06/24/22 1447 06/25/22 0257  NA 137 136  K 3.0* 3.3*  CL 99 102  CO2 30 27  GLUCOSE 128* 92  BUN 20 14  CREATININE 1.43* 1.08  CALCIUM 8.3* 7.7*     Studies/Results: CT ABDOMEN PELVIS W CONTRAST  Result Date: 06/24/2022 CLINICAL DATA:  Abdominal pain, acute, nonlocalized eval for fournier's gangrene EXAM: CT ABDOMEN AND PELVIS WITH CONTRAST TECHNIQUE: Multidetector CT imaging of the abdomen and pelvis was performed using the standard protocol following bolus administration of intravenous contrast. RADIATION DOSE REDUCTION: This exam was performed according to the departmental dose-optimization program which includes automated exposure control, adjustment of the mA and/or kV according to patient size and/or use of iterative reconstruction technique. CONTRAST:  161m OMNIPAQUE IOHEXOL 300 MG/ML  SOLN COMPARISON:  None  Available. FINDINGS: Lower chest: Tiny hiatal hernia.  No acute abnormality. Hepatobiliary: No focal liver abnormality. No gallstones, gallbladder wall thickening, or pericholecystic fluid. No biliary dilatation. Pancreas: No focal lesion. Normal pancreatic contour. No surrounding inflammatory changes. No main pancreatic ductal dilatation. Spleen: Normal in size without focal abnormality. Adrenals/Urinary Tract: No adrenal nodule bilaterally. Bilateral kidneys enhance symmetrically. No hydronephrosis. No hydroureter. Air-fluid level within the urinary bladder lumen. Query gas within the urinary bladder wall best evaluated on coronal imaging. Stomach/Bowel: Stomach is within normal limits. No evidence of bowel wall thickening or dilatation. Appendix appears normal. Vascular/Lymphatic: No abdominal aorta or iliac aneurysm. Mild atherosclerotic plaque of the aorta and its branches. No abdominal, pelvic, or inguinal lymphadenopathy. Reproductive: Prostate is unremarkable. Other: No intraperitoneal free fluid. No intraperitoneal free gas. No organized fluid collection. Musculoskeletal: No subcutaneus soft tissue edema, emphysema, or on ice fluid collection within the soft tissues. Irregular dermal thickening of the gluteal soft tissues noted. Small fat containing umbilical hernia with an abdominal defect of 1.5 cm. No suspicious lytic or blastic osseous lesions. No acute displaced fracture. Multilevel degenerative changes of the spine. IMPRESSION: 1. Tiny hiatal hernia. 2. Gas noted within the urinary bladder lumen, urinary bladder wall, and left renal findings suggestive of emphysematous cystitis and pyelitis. 3. Irregular dermal thickening of the gluteal soft tissues. Correlate with physical exam for focal mass lesions versus cellulitis. 4. No organized fluid collection or subcutaneus soft tissue emphysema/edema to suggest Fournier's gangrene-however this cannot be fully excluded as this is a clinical diagnosis. 5.   Aortic Atherosclerosis (ICD10-I70.0). These results were called by telephone  at the time of interpretation on 06/24/2022 at 5:40 pm to provider Dr. Wyvonnia Dusky, who verbally acknowledged these results. Electronically Signed   By: Iven Finn M.D.   On: 06/24/2022 18:11   DG Chest Port 1 View  Result Date: 06/24/2022 CLINICAL DATA:  Altered mental status.  Possible sepsis. EXAM: PORTABLE CHEST 1 VIEW COMPARISON:  Chest x-ray dated Mar 04, 2022. FINDINGS: The heart size and mediastinal contours are within normal limits. Both lungs are clear. The visualized skeletal structures are unremarkable. IMPRESSION: No active disease. Electronically Signed   By: Titus Dubin M.D.   On: 06/24/2022 15:22   CT Head Wo Contrast  Result Date: 06/24/2022 CLINICAL DATA:  Altered mental status EXAM: CT HEAD WITHOUT CONTRAST TECHNIQUE: Contiguous axial images were obtained from the base of the skull through the vertex without intravenous contrast. RADIATION DOSE REDUCTION: This exam was performed according to the departmental dose-optimization program which includes automated exposure control, adjustment of the mA and/or kV according to patient size and/or use of iterative reconstruction technique. COMPARISON:  Head CT 02/13/2022 FINDINGS: Brain: There is no acute intracranial hemorrhage, extra-axial fluid collection, or acute infarct Parenchymal volume is normal. The ventricles are normal in size. Gray-white differentiation is preserved. There is no mass lesion.  There is no mass effect or midline shift. Vascular: No hyperdense vessel or unexpected calcification. Skull: Normal. Negative for fracture or focal lesion. Sinuses/Orbits: The imaged paranasal sinuses are clear. The globes and orbits are unremarkable. Other: None. IMPRESSION: No acute intracranial pathology. Electronically Signed   By: Valetta Mole M.D.   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 -Continue broad-spectrum antibiotics -Wounds are stable and consistent with chronic hidradenitis.  Appreciate wound care assistance -Following    LOS: 1 day   Matt R. Keimora Swartout MD 06/25/2022, 10:45 AM [facility]  Pager: [phone]

## 2022-06-25 NOTE — Progress Notes (Signed)
PROGRESS NOTE    [name]  XTK:[id number] DOB: [birth year] DOA: 06/24/2022 PCP: [name], MD    Brief Narrative:   [name] is a 36 y.o. male with past medical history significant for HIV, bipolar 1 disorder, depression, schizoaffective disorder, PTSD, seizure, chronic buttock wounds, hidradenitis suppurativa who presented to [facility] ED on 9/15 via EMS after he was found altered at home by his home health nurse.  Patient apparently lives alone and is nonambulatory but has a home health nurse who assists him during the day.  Due to patient's mental status, history obtained from EMS/ED provider reports as well as mother.  Mother reported that he drinks approximately 4-5 40 ounce beers a day and has been having some increased wounds/skin breakdown to groin as well as nausea/vomiting and diarrhea.  It is reported that he typically sits in his body weight [facility] being seen by his home health nurses; and he typically has been drinking alcohol and not taking in anything else.  In the ED, temperature 96.7 F, HR 119, RR 18, BP 85/45, SPO2 81% on room air.  WBC 15.1, hemoglobin 8.5, platelets 76.  Sodium 137, potassium 3.0, chloride 99, CO2 30, glucose 128, BUN 20, creatinine 1.43, magnesium 2.2, AST 13, ALT 15, total bilirubin 0.8.  Lactic acid 2.0.  Procalcitonin 0.60.  INR 1.3.  COVID-19 PCR negative.  Influenza A/B PCR negative.  FOBT positive.  EtOH level less than 10.  MRSA PCR positive.  CT head without contrast with no acute intracranial abnormality.  Chest x-ray with no active cardiopulmonary disease process.  CT abdomen/pelvis with contrast with tiny hiatal hernia, gas noted within the urinary bladder lumen, urinary bladder wall, and left renal finding suggestive of emphysematous cystitis and pyelitis, irregular dermal thickening of the gluteal soft tissues, no organized fluid collection or subcutaneous soft tissue emphysema/edema to suggest Fourniers gangrene.  Blood cultures obtained.   Patient was started on empiric antibiotics.  TRH consulted for further evaluation management of sepsis likely secondary to cellulitis.  Assessment & Plan:   Sepsis, POA Emphysematous cystitis/left pyelitis Cellulitis groin Chronic hidradenitis suppurativa involving groin creases, pubic region, buttocks Patient presenting to the ED via EMS after being found by home health nurse altered while lying in his own excrement.  On arrival to the ED, patient was hypothermic, elevated to [facility] count of 15.1 with procalcitonin 0.60, lactic acid 2.0.  MRSA PCR positive.  CT abdomen/pelvis notable for emphysematous cystitis/pyelitis and irregular dermal thickening of the gluteal soft tissues consistent with cellulitis.  Urology was consulted, who recommended broad-spectrum antibiotics, Foley catheter placement to allow for maximal decompression with no plans for left ureteral stent placement given he has no hydronephrosis and this is likely an ascending infection. -- Foley catheter ordered -- WBC 15.1>14.3 -- Blood cultures x2: Pending -- Vancomycin, pharmacy consulted for dosing/monitoring -- Cefepime -- Metronidazole -- Supportive care -- CBC daily -- Repeat procalcitonin and lactic acid in a.m.  Bipolar 1 disorder Schizoaffective disorder PTSD Depression/anxiety -- Lurasidone 120 mg p.o. daily -- Haldol 5 mg p.o. daily -- Lexapro 20 mg p.o. daily -- Hold home Xanax for now until mental status improves  Orthostatic hypotension/autonomic dysfunction --Midodrine 5 mg p.o. 3 times daily AC  Hx HIV Follows with infectious disease outpatient, [facility].  Per review of EMR, notes state he is maintained on [place]; but although not listed on home medication list --messaged pharmacist to verify in order to restart  Anemia of chronic medical disease  Hemoglobin 7.2 this morning.  Anemia panel with iron 50, TIBC low at 106, ferritin high at 386, folate 11.2, vitamin B12 452; all  consistent with anemia of chronic disease. -- CBC daily, transfuse for hemoglobin <7.0  EtOH use disorder EtOH level less than 10 on admission.  Counseled on need for complete cessation  Chronic buttock wounds Pressure Injury 02/13/22 Buttocks Bilateral (Active)  02/13/22 2030  Location: Buttocks  Location Orientation: Bilateral  Staging:   Wound Description (Comments):   Present on Admission: Yes  --Wound RN consulted --soap and water cleanse, rinse and gentle pat dry for all affected areas (including axillae) followed by placement of silver hydrofiber (Aquacel Ag+ Advantage) topped with silicone foam to the buttock and thigh lesions. Moisture barrier cream is to be applied to the surrounding macerated areas.    DVT prophylaxis: SCDs Start: 06/24/22 2131    Code Status: DNR Family Communication: No family present at bedside this morning  Disposition Plan:  Level of care: Stepdown Status is: Inpatient Remains inpatient appropriate because: IV antibiotics    Consultants:  Urology, [name]  Procedures:  None  Antimicrobials:  Vancomycin 9/15>> Cefepime 9/15>> Metronidazole 9/15>>   Subjective: Patient seen examined bedside, resting comfortably.  More alert this morning although slightly confused.  Seen with RN at bedside.  Patient with no specific complaints this morning.  Bair hugger now turned off.  Denies headache, no visual changes, no chest pain, no palpitations, no shortness of breath, no abdominal pain.  No acute events overnight per nursing staff.  Objective: Vitals:   06/25/22 0300 06/25/22 0400 06/25/22 0500 06/25/22 0600  BP: 99/61 (!) 102/58 111/64 116/71  Pulse: (!) 112 (!) 110 (!) 108 (!) 105  Resp: 18 19 16 14   Temp:  98.7 F (37.1 C)    TempSrc:  Oral    SpO2: 96% 100% 100% 100%  Weight:      Height:        Intake/Output Summary (Last 24 hours) at 06/25/2022 3976 Last data filed at 06/25/2022 0600 Gross per 24 hour  Intake --  Output 300 ml   Net -300 ml   Filed Weights   06/24/22 2200  Weight: 81.1 kg    Examination:  Physical Exam: GEN: NAD, alert and oriented x 2 [name] and Year 2023), no answer to president's name, chronically ill in appearance, appears older than stated age HEENT: NCAT, PERRL, EOMI, sclera clear, dry mucous membranes, poor dentition PULM: CTAB w/o wheezes/crackles, normal respiratory effort, on 2.5 L nasal cannula with SPO2 100% at rest CV: RRR w/o M/G/R GI: abd soft, NTND, NABS, no R/G/M MSK: no peripheral edema, moves all extremities independently NEURO: CN II-XII intact, no focal deficits, sensation to light touch intact PSYCH: normal mood/affect Integumentary: Thickened skin with evidence of chronic hidradenitis over his suprapubic, groin, buttock region with several areas of small open wounds with no significant drainage of purulent material.    Data Reviewed: I have personally reviewed following labs and imaging studies  CBC: Recent Labs  Lab 06/24/22 1447 06/24/22 2154 06/25/22 0257  WBC 15.1*  --  12.4*  NEUTROABS 11.1*  --   --   HGB 8.5* 8.5* 7.2*  HCT 26.2* 27.2* 22.1*  MCV 96.3  --  95.3  PLT 76*  --  66*   Basic Metabolic Panel: Recent Labs  Lab 06/24/22 1447 06/25/22 0257  NA 137 136  K 3.0* 3.3*  CL 99 102  CO2 30 27  GLUCOSE 128*  92  BUN 20 14  CREATININE 1.43* 1.08  CALCIUM 8.3* 7.7*  MG 2.2  --    GFR: Estimated Creatinine Clearance: 106.4 mL/min (by C-G formula based on SCr of 1.08 mg/dL). Liver Function Tests: Recent Labs  Lab 06/24/22 1447 06/25/22 0257  AST 13* 13*  ALT 15 12  ALKPHOS 123 91  BILITOT 0.8 0.8  PROT 7.7 6.1*  ALBUMIN 2.2* 1.7*   No results for input(s): "LIPASE", "AMYLASE" in the last 168 hours. No results for input(s): "AMMONIA" in the last 168 hours. Coagulation Profile: Recent Labs  Lab 06/24/22 1447 06/25/22 0257  INR 1.3* 1.3*   Cardiac Enzymes: No results for input(s): "CKTOTAL", "CKMB", "CKMBINDEX",  "TROPONINI" in the last 168 hours. BNP (last 3 results) No results for input(s): "PROBNP" in the last 8760 hours. HbA1C: No results for input(s): "HGBA1C" in the last 72 hours. CBG: Recent Labs  Lab 06/24/22 1447  GLUCAP 96   Lipid Profile: No results for input(s): "CHOL", "HDL", "LDLCALC", "TRIG", "CHOLHDL", "LDLDIRECT" in the last 72 hours. Thyroid Function Tests: No results for input(s): "TSH", "T4TOTAL", "FREET4", "T3FREE", "THYROIDAB" in the last 72 hours. Anemia Panel: Recent Labs    06/25/22 0804  RETICCTPCT 0.8   Sepsis Labs: Recent Labs  Lab 06/24/22 1447 06/25/22 0257  PROCALCITON  --  0.60  LATICACIDVEN 2.0*  --     Recent Results (from the past 240 hour(s))  Blood Culture (routine x 2)     Status: None (Preliminary result)   Collection Time: 06/24/22  2:52 PM   Specimen: BLOOD  Result Value Ref Range Status   Specimen Description   Final    BLOOD SITE NOT SPECIFIED Performed at [facility] [address] [place], [place] [address]    Special Requests   Final    BOTTLES DRAWN AEROBIC AND ANAEROBIC Blood Culture adequate volume Performed at [facility] [address] [place], [place] [address]    Culture   Final    NO GROWTH < 12 HOURS Performed at [facility] [address], [place], [place] [address]    Report Status PENDING  Incomplete  Resp Panel by RT-PCR (Flu A&B, Covid) Anterior Nasal Swab     Status: None   Collection Time: 06/24/22  2:58 PM   Specimen: Anterior Nasal Swab  Result Value Ref Range Status   SARS Coronavirus 2 by RT PCR NEGATIVE NEGATIVE Final    Comment: (NOTE) SARS-CoV-2 target nucleic acids are NOT DETECTED.  The SARS-CoV-2 RNA is generally detectable in upper respiratory specimens during the acute phase of infection. The lowest concentration of SARS-CoV-2 viral copies this assay can detect is 138 copies/mL. A negative result does not preclude SARS-Cov-2 infection and  should not be used as the sole basis for treatment or other patient management decisions. A negative result may occur with  improper specimen collection/handling, submission of specimen other than nasopharyngeal swab, presence of viral mutation(s) within the areas targeted by this assay, and inadequate number of viral copies(<138 copies/mL). A negative result must be combined with clinical observations, patient history, and epidemiological information. The expected result is Negative.  Fact Sheet for Patients:  EntrepreneurPulse.com.au  Fact Sheet for Healthcare Providers:  IncredibleEmployment.be  This test is no t yet approved or cleared by the [place] FDA and  has been authorized for detection and/or diagnosis of SARS-CoV-2 by FDA under an Emergency Use Authorization (EUA). This EUA will remain  in effect (meaning this test  can be used) for the duration of the COVID-19 declaration under Section 564(b)(1) of the Act, 21 U.S.C.section 360bbb-3(b)(1), unless the authorization is terminated  or revoked sooner.       Influenza A by PCR NEGATIVE NEGATIVE Final   Influenza B by PCR NEGATIVE NEGATIVE Final    Comment: (NOTE) The Xpert Xpress SARS-CoV-2/FLU/RSV plus assay is intended as an aid in the diagnosis of influenza from Nasopharyngeal swab specimens and should not be used as a sole basis for treatment. Nasal washings and aspirates are unacceptable for Xpert Xpress SARS-CoV-2/FLU/RSV testing.  Fact Sheet for Patients: EntrepreneurPulse.com.au  Fact Sheet for Healthcare Providers: IncredibleEmployment.be  This test is not yet approved or cleared by the [place] FDA and has been authorized for detection and/or diagnosis of SARS-CoV-2 by FDA under an Emergency Use Authorization (EUA). This EUA will remain in effect (meaning this test can be used) for the duration of the COVID-19 declaration  under Section 564(b)(1) of the Act, 21 U.S.C. section 360bbb-3(b)(1), unless the authorization is terminated or revoked.  Performed at [facility], [place] [address], [place], [place] [address]   MRSA Next Gen by PCR, Nasal     Status: Abnormal   Collection Time: 06/24/22 10:00 PM   Specimen: Nasal Mucosa; Nasal Swab  Result Value Ref Range Status   MRSA by PCR Next Gen DETECTED (A) NOT DETECTED Final    Comment: CRITICAL RESULT CALLED TO, READ BACK BY AND VERIFIED WITH: Dorise Bullion, RN (NOTE) The GeneXpert MRSA Assay (FDA approved for NASAL specimens only), is one component of a comprehensive MRSA colonization surveillance program. It is not intended to diagnose MRSA infection nor to guide or monitor treatment for MRSA infections. Test performance is not FDA approved in patients less than 38 years old. Performed at [facility], [place] [address] [place], [place] [address]   Blood Culture (routine x 2)     Status: None (Preliminary result)   Collection Time: 06/24/22 10:02 PM   Specimen: BLOOD LEFT HAND  Result Value Ref Range Status   Specimen Description   Final    BLOOD LEFT HAND Performed at [facility] [address], [place], [place] [address]    Special Requests   Final    BOTTLES DRAWN AEROBIC ONLY Blood Culture results may not be optimal due to an inadequate volume of blood received in culture bottles Performed at [facility] [address] [place], [place] [address]    Culture   Final    NO GROWTH < 12 HOURS Performed at [facility] [address] [place], [place] [address]    Report Status PENDING  Incomplete         Radiology Studies: CT ABDOMEN PELVIS W CONTRAST  Result Date: 06/24/2022 CLINICAL DATA:  Abdominal pain, acute, nonlocalized eval for fournier's gangrene EXAM: CT ABDOMEN AND PELVIS WITH CONTRAST TECHNIQUE: Multidetector CT imaging of the abdomen and  pelvis was performed using the standard protocol following bolus administration of intravenous contrast. RADIATION DOSE REDUCTION: This exam was performed according to the departmental dose-optimization program which includes automated exposure control, adjustment of the mA and/or kV according to patient size and/or use of iterative reconstruction technique. CONTRAST:  149m OMNIPAQUE IOHEXOL 300 MG/ML  SOLN COMPARISON:  None Available. FINDINGS: Lower chest: Tiny hiatal hernia.  No acute abnormality. Hepatobiliary: No focal liver abnormality. No gallstones, gallbladder wall thickening, or pericholecystic fluid. No biliary dilatation. Pancreas: No focal lesion. Normal pancreatic  contour. No surrounding inflammatory changes. No main pancreatic ductal dilatation. Spleen: Normal in size without focal abnormality. Adrenals/Urinary Tract: No adrenal nodule bilaterally. Bilateral kidneys enhance symmetrically. No hydronephrosis. No hydroureter. Air-fluid level within the urinary bladder lumen. Query gas within the urinary bladder wall best evaluated on coronal imaging. Stomach/Bowel: Stomach is within normal limits. No evidence of bowel wall thickening or dilatation. Appendix appears normal. Vascular/Lymphatic: No abdominal aorta or iliac aneurysm. Mild atherosclerotic plaque of the aorta and its branches. No abdominal, pelvic, or inguinal lymphadenopathy. Reproductive: Prostate is unremarkable. Other: No intraperitoneal free fluid. No intraperitoneal free gas. No organized fluid collection. Musculoskeletal: No subcutaneus soft tissue edema, emphysema, or on ice fluid collection within the soft tissues. Irregular dermal thickening of the gluteal soft tissues noted. Small fat containing umbilical hernia with an abdominal defect of 1.5 cm. No suspicious lytic or blastic osseous lesions. No acute displaced fracture. Multilevel degenerative changes of the spine. IMPRESSION: 1. Tiny hiatal hernia. 2. Gas noted within the  urinary bladder lumen, urinary bladder wall, and left renal findings suggestive of emphysematous cystitis and pyelitis. 3. Irregular dermal thickening of the gluteal soft tissues. Correlate with physical exam for focal mass lesions versus cellulitis. 4. No organized fluid collection or subcutaneus soft tissue emphysema/edema to suggest Fournier's gangrene-however this cannot be fully excluded as this is a clinical diagnosis. 5.  Aortic Atherosclerosis (ICD10-I70.0). These results were called by telephone at the time of interpretation on 06/24/2022 at 5:40 pm to provider Dr. Wyvonnia Dusky, who verbally acknowledged these results. Electronically Signed   By: Iven Finn M.D.   On: 06/24/2022 18:11   DG Chest Port 1 View  Result Date: 06/24/2022 CLINICAL DATA:  Altered mental status.  Possible sepsis. EXAM: PORTABLE CHEST 1 VIEW COMPARISON:  Chest x-ray dated Mar 04, 2022. FINDINGS: The heart size and mediastinal contours are within normal limits. Both lungs are clear. The visualized skeletal structures are unremarkable. IMPRESSION: No active disease. Electronically Signed   By: Titus Dubin M.D.   On: 06/24/2022 15:22   CT Head Wo Contrast  Result Date: 06/24/2022 CLINICAL DATA:  Altered mental status EXAM: CT HEAD WITHOUT CONTRAST TECHNIQUE: Contiguous axial images were obtained from the base of the skull through the vertex without intravenous contrast. RADIATION DOSE REDUCTION: This exam was performed according to the departmental dose-optimization program which includes automated exposure control, adjustment of the mA and/or kV according to patient size and/or use of iterative reconstruction technique. COMPARISON:  Head CT 02/13/2022 FINDINGS: Brain: There is no acute intracranial hemorrhage, extra-axial fluid collection, or acute infarct Parenchymal volume is normal. The ventricles are normal in size. Gray-white differentiation is preserved. There is no mass lesion.  There is no mass effect or midline  shift. Vascular: No hyperdense vessel or unexpected calcification. Skull: Normal. Negative for fracture or focal lesion. Sinuses/Orbits: The imaged paranasal sinuses are clear. The globes and orbits are unremarkable. Other: None. IMPRESSION: No acute intracranial pathology. Electronically Signed   By: Valetta Mole M.D.   On: 06/24/2022 15:18        Scheduled Meds:  Chlorhexidine Gluconate Cloth  6 each Topical Q0600   escitalopram  20 mg Oral Daily   folic acid  1 mg Oral Daily   haloperidol  5 mg Oral Daily   lurasidone  120 mg Oral Q breakfast   midodrine  5 mg Oral TID WC   multivitamin with minerals  1 tablet Oral Daily   nystatin   Topical BID   pantoprazole (PROTONIX) IV  40 mg Intravenous Q24H   thiamine  100 mg Oral Daily   Or   thiamine  100 mg Intravenous Daily   valACYclovir  1,000 mg Oral Daily   Continuous Infusions:  ceFEPime (MAXIPIME) IV Stopped (06/25/22 1062)   lactated ringers 150 mL/hr at 06/25/22 0115   metronidazole Stopped (06/25/22 6948)   vancomycin Stopped (06/25/22 0253)     LOS: 1 day    Time spent: 52 minutes spent on chart review, discussion with nursing staff, consultants, updating family and interview/physical exam; more than 50% of that time was spent in counseling and/or coordination of care.    Marvia Troost J [place] DO [facility] Available via Epic secure chat 7am-7pm After these hours, please refer to coverage provider listed on [email or web address] 06/25/2022, 9:28 AM

## 2022-06-25 NOTE — Progress Notes (Addendum)
Pharmacy Antibiotic Note  [name] is a 36 y.o. male admitted on 06/24/2022. 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 Patient with chronic wounds within groin creases. Pharmacy has been consulted for vancomycin and cefepime dosing for cellulitis. 06/25/2022 Patient's SCr 1.43 >> 1.08 WBC 15.1> 12.4 Afebrile PCT 0.6  Plan: -Adjust Vancomycin to 1250 mg IV q12h, est AUC 461.9, Css min 12.1 using SCr 1.08, TBW 81.1 -Vancomycin levels at steady state, as indicated -Continue Cefepime 2g IV q8h - Flagyl 500 mg IV q12 per MD -Pharmacy to continue to follow renal function, cultures, and clinical progress for dose adjustments and de-escalation as indicated IV thiamine & PPI changed to PO  Height: 6' 3"  (190.5 cm) Weight: 81.1 kg (178 lb 12.7 oz) IBW/kg (Calculated) : 84.5  Temp (24hrs), Avg:96.3 F (35.7 C), Min:94.5 F (34.7 C), Max:98.7 F (37.1 C)  Recent Labs  Lab 06/24/22 1447 06/25/22 0257  WBC 15.1* 12.4*  CREATININE 1.43* 1.08  LATICACIDVEN 2.0*  --      Estimated Creatinine Clearance: 106.4 mL/min (by C-G formula based on SCr of 1.08 mg/dL).    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  Eudelia Bunch, Pharm.D 06/25/2022 10:34 AM

## 2022-06-25 NOTE — Progress Notes (Signed)
2 Days Post-Op Subjective: Denies pain. Awake alert, answering questions appropriately.  Tolerating diet.  Afebrile    Objective: Vital signs in last 24 hours: Temp:  [94.5 F (34.7 C)-98.1 F (36.7 C)] 97.5 F (36.4 C) (09/17 0910) Pulse Rate:  [87-99] 94 (09/17 0910) Resp:  [9-20] 17 (09/17 0910) BP: (74-109)/(44-62) 94/53 (09/17 0910) SpO2:  [86 %-100 %] 99 % (09/17 0910)  Intake/Output from previous day: 09/16 0701 - 09/17 0700 In: 2183.8 [P.O.:200; I.V.:1024.6; IV Piggyback:959.2] Out: 900 [Urine:900] Intake/Output this shift: Total I/O In: 318 [Blood:318] Out: 500 [Urine:500] UOP: 910m  Physical Exam:  General: Alert and oriented CV: RRR Lungs: Clear Abdomen: Soft, ND, nontender GU: Chronic hidradenitis over suprapubic region and bilateral groin creases.  No areas of fluctuance, induration, or necrosis over scrotum or perineum. Ext: NT, No erythema  Lab Results: Recent Labs    06/24/22 2154 06/25/22 0257 06/26/22 0245  HGB 8.5* 7.2* 6.1*  HCT 27.2* 22.1* 19.7*   BMET Recent Labs    06/25/22 0257 06/26/22 0245  NA 136 142  K 3.3* 3.7  CL 102 112*  CO2 27 26  GLUCOSE 92 84  BUN 14 13  CREATININE 1.08 1.12  CALCIUM 7.7* 7.8*     Studies/Results: CT ABDOMEN PELVIS W CONTRAST  Result Date: 06/24/2022 CLINICAL DATA:  Abdominal pain, acute, nonlocalized eval for fournier's gangrene EXAM: CT ABDOMEN AND PELVIS WITH CONTRAST TECHNIQUE: Multidetector CT imaging of the abdomen and pelvis was performed using the standard protocol following bolus administration of intravenous contrast. RADIATION DOSE REDUCTION: This exam was performed according to the departmental dose-optimization program which includes automated exposure control, adjustment of the mA and/or kV according to patient size and/or use of iterative reconstruction technique. CONTRAST:  1079mOMNIPAQUE IOHEXOL 300 MG/ML  SOLN COMPARISON:  None Available. FINDINGS: Lower chest: Tiny hiatal hernia.  No  acute abnormality. Hepatobiliary: No focal liver abnormality. No gallstones, gallbladder wall thickening, or pericholecystic fluid. No biliary dilatation. Pancreas: No focal lesion. Normal pancreatic contour. No surrounding inflammatory changes. No main pancreatic ductal dilatation. Spleen: Normal in size without focal abnormality. Adrenals/Urinary Tract: No adrenal nodule bilaterally. Bilateral kidneys enhance symmetrically. No hydronephrosis. No hydroureter. Air-fluid level within the urinary bladder lumen. Query gas within the urinary bladder wall best evaluated on coronal imaging. Stomach/Bowel: Stomach is within normal limits. No evidence of bowel wall thickening or dilatation. Appendix appears normal. Vascular/Lymphatic: No abdominal aorta or iliac aneurysm. Mild atherosclerotic plaque of the aorta and its branches. No abdominal, pelvic, or inguinal lymphadenopathy. Reproductive: Prostate is unremarkable. Other: No intraperitoneal free fluid. No intraperitoneal free gas. No organized fluid collection. Musculoskeletal: No subcutaneus soft tissue edema, emphysema, or on ice fluid collection within the soft tissues. Irregular dermal thickening of the gluteal soft tissues noted. Small fat containing umbilical hernia with an abdominal defect of 1.5 cm. No suspicious lytic or blastic osseous lesions. No acute displaced fracture. Multilevel degenerative changes of the spine. IMPRESSION: 1. Tiny hiatal hernia. 2. Gas noted within the urinary bladder lumen, urinary bladder wall, and left renal findings suggestive of emphysematous cystitis and pyelitis. 3. Irregular dermal thickening of the gluteal soft tissues. Correlate with physical exam for focal mass lesions versus cellulitis. 4. No organized fluid collection or subcutaneus soft tissue emphysema/edema to suggest Fournier's gangrene-however this cannot be fully excluded as this is a clinical diagnosis. 5.  Aortic Atherosclerosis (ICD10-I70.0). These results were  called by telephone at the time of interpretation on 06/24/2022 at 5:40 pm to provider Dr. RaWyvonnia Duskywho  verbally acknowledged these results. Electronically Signed   By: Iven Finn M.D.   On: 06/24/2022 18:11   DG Chest Port 1 View  Result Date: 06/24/2022 CLINICAL DATA:  Altered mental status.  Possible sepsis. EXAM: PORTABLE CHEST 1 VIEW COMPARISON:  Chest x-ray dated Mar 04, 2022. FINDINGS: The heart size and mediastinal contours are within normal limits. Both lungs are clear. The visualized skeletal structures are unremarkable. IMPRESSION: No active disease. Electronically Signed   By: Titus Dubin M.D.   On: 06/24/2022 15:22   CT Head Wo Contrast  Result Date: 06/24/2022 CLINICAL DATA:  Altered mental status EXAM: CT HEAD WITHOUT CONTRAST TECHNIQUE: Contiguous axial images were obtained from the base of the skull through the vertex without intravenous contrast. RADIATION DOSE REDUCTION: This exam was performed according to the departmental dose-optimization program which includes automated exposure control, adjustment of the mA and/or kV according to patient size and/or use of iterative reconstruction technique. COMPARISON:  Head CT 02/13/2022 FINDINGS: Brain: There is no acute intracranial hemorrhage, extra-axial fluid collection, or acute infarct Parenchymal volume is normal. The ventricles are normal in size. Gray-white differentiation is preserved. There is no mass lesion.  There is no mass effect or midline shift. Vascular: No hyperdense vessel or unexpected calcification. Skull: Normal. Negative for fracture or focal lesion. Sinuses/Orbits: The imaged paranasal sinuses are clear. The globes and orbits are unremarkable. Other: None. IMPRESSION: No acute intracranial pathology. Electronically Signed   By: Valetta Mole M.D.   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 -Will need culture specific antibiotics for 2 weeks -Wounds are stable and consistent with chronic hidradenitis.  Appreciate wound care assistance -Noted drop in H/H. Urine remains clear yellow. Transfuse per primary team. -Following   LOS: 2 days   Matt R. Vieva Brummitt MD 06/26/2022, 9:19 AM [facility]  Pager: [phone]

## 2022-06-25 NOTE — Progress Notes (Signed)
Initial Nutrition Assessment  DOCUMENTATION CODES:   Not applicable  INTERVENTION:  Continue Regular diet as ordered Encourage adequate PO intake Ensure Plus High Protein po TID, each supplement provides 350 kcal and 20 grams of protein. MVI with minerals daily  NUTRITION DIAGNOSIS:   Increased nutrient needs related to chronic illness as evidenced by estimated needs.  GOAL:   Patient will meet greater than or equal to 90% of their needs  MONITOR:   PO intake, Supplement acceptance, Labs, Weight trends, Skin  REASON FOR ASSESSMENT:   Consult Assessment of nutrition requirement/status  ASSESSMENT:   Pt admitted with AMS and sepsis likely r/t cellulitis of groin. PMH significant for Bipolar d/o, HIV, schizoaffective d/o, seizures, and PTSD.   Pt is non-ambulatory at baseline, lives alone and has a home health nurse that checks on him.   Reviewed notes. Pt's mother reports that he drinks approximately 4-5 40oz beers daily, has not been taking in any other PO, and has had n/v and diarrhea.    Unsuccessful attempts to reach pt via phone call to room x3. Unable to obtain detailed nutrition related history at this time.   No meal completions on file to review.  Reviewed weight history. Since 08/28/21 it appears he has had a significant weight loss of 25.9%. Given inability to perform nutrition focused physical exam, unable to determine whether he has malnutrition however it is likely given severe weight loss.   Medications: Biktarvy, folvite, MVI, nystatin, protonix, thiamine  Labs: potassium 3.3, AST 13  NUTRITION - FOCUSED PHYSICAL EXAM: RD working remotely. Deferred to follow up.   Diet Order:   Diet Order             Diet regular Room service appropriate? Yes; Fluid consistency: Thin  Diet effective now                   EDUCATION NEEDS:   No education needs have been identified at this time  Skin:  Skin Assessment: Reviewed RN Assessment (per WOC:  Diffuse area affected on the bilateral buttocks, medial and posterior thighs with scarring and pin point open areas, some with yellow drainage, others without)  Last BM:  PTA  Height:   Ht Readings from Last 1 Encounters:  06/24/22 6' 3"  (1.905 m)    Weight:   Wt Readings from Last 1 Encounters:  06/24/22 81.1 kg   BMI:  Body mass index is 22.35 kg/m.  Estimated Nutritional Needs:   Kcal:  5615-3794  Protein:  115-130g  Fluid:  >/=2L  Clayborne Dana, RDN, LDN Clinical Nutrition

## 2022-06-25 NOTE — Consult Note (Addendum)
[facility] Nurse Consult Note: Reason for Consult:Patient well known to the [facility] team from numerous previous admissions. Wounds with presentation of hidradenitis suppurativa (HS); Dr. Sherrill Raring notes HS in the patient's past medial history. Patient was also found by the [facility] to be seated in urinary and fecal incontinence with superimposed irritant contact dermatitis  ICD-10 CM Codes for Irritant Dermatitis L24A2 - Due to fecal, urinary or dual incontinence L30.4  - Erythema intertrigo. Also used for abrasion of the hand, chafing of the skin, dermatitis due to sweating and friction, friction dermatitis, friction eczema, and genital/thigh intertrigo.   Wound type: inflammatory, chronic Pressure Injury POA: [name] Measurement:[name]. Diffuse area affected on the bilateral buttocks, medial and posterior thighs with scarring and pin point open areas, some with yellow drainage, others without.  Axillae with scarring consistent with HS, no draining wounds at this time. Wound bed: Drainage (amount, consistency, odor)  Periwound:Periwound skin with maceration and residual soiling from fecal incontinence from prolonged exposure to incontinence and drainage. Dressing procedure/placement/frequency: Patient is on a mattress replacement with low air loss feature as he is in the ICU. I will continue this as he is transferred to the floor for microclimate management.  Topical care guidance is provided for affected areas using a soap and water cleanse, rinse and gentle pat dry for all affected areas (including axillae) followed by placement of silver hydrofiber (Aquacel Ag+ Advantage, [employer] # [id number]) topped with silicone foam to the buttock and thigh lesions. Moisture barrier cream is to be applied to the surrounding macerated areas. Turning and repositioning is in place and heel are floated for pressure injury prevention. DermaTherapy antimicrobial low friction coefficient (CoF) bed linens are in use. No disposable  underpads or briefs are recommended.  [facility] nursing team will not follow, but will remain available to this patient, the nursing and medical teams.  Please re-consult if needed.  Thank you for inviting [place] to participate in this patient's Plan of Care.  Maudie Flakes, MSN, RN, CNS, [place], Serita Grammes, [employer], [employer] phone:  [phone]

## 2022-06-25 NOTE — Progress Notes (Addendum)
Pharmacy Antibiotic Note  [name] is a 36 y.o. male admitted on 06/24/2022. 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 Patient with chronic wounds within groin creases. Pharmacy has been consulted for vancomycin and cefepime dosing for cellulitis.  Patient's SCr 1.43 on admission, elevated above baseline < 1.  Plan: -Received Vancomycin 1g + Cefepime 2g in the ED -Adjust Vancomycin to 1g IV q12h based on updated weight, current renal function to keep AUC 400-550 -Vancomycin levels at steady state, as indicated -Continue Cefepime 2g IV q8h -Pharmacy to continue to follow renal function, cultures, and clinical progress for dose adjustments and de-escalation as indicated  Height: 6' 3"  (190.5 cm) Weight: 81.1 kg (178 lb 12.7 oz) IBW/kg (Calculated) : 84.5  Temp (24hrs), Avg:95.3 F (35.2 C), Min:94.5 F (34.7 C), Max:96.7 F (35.9 C)  Recent Labs  Lab 06/24/22 1447  WBC 15.1*  CREATININE 1.43*  LATICACIDVEN 2.0*     Estimated Creatinine Clearance: 80.3 mL/min (A) (by C-G formula based on SCr of 1.43 mg/dL (H)).    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   Lindell Spar, PharmD, BCPS Clinical Pharmacist 06/25/2022 12:08 AM

## 2022-06-26 ENCOUNTER — Other Ambulatory Visit: Payer: Self-pay

## 2022-06-26 DIAGNOSIS — N179 Acute kidney failure, unspecified: Secondary | ICD-10-CM | POA: Diagnosis not present

## 2022-06-26 DIAGNOSIS — D510 Vitamin B12 deficiency anemia due to intrinsic factor deficiency: Secondary | ICD-10-CM | POA: Diagnosis not present

## 2022-06-26 DIAGNOSIS — A419 Sepsis, unspecified organism: Secondary | ICD-10-CM | POA: Diagnosis not present

## 2022-06-26 DIAGNOSIS — G9341 Metabolic encephalopathy: Secondary | ICD-10-CM | POA: Diagnosis not present

## 2022-06-26 LAB — CBC
HCT: 19.7 % — ABNORMAL LOW (ref 39.0–52.0)
Hemoglobin: 6.1 g/dL — CL (ref 13.0–17.0)
MCH: 31 pg (ref 26.0–34.0)
MCHC: 31 g/dL (ref 30.0–36.0)
MCV: 100 fL (ref 80.0–100.0)
Platelets: 59 10*3/uL — ABNORMAL LOW (ref 150–400)
RBC: 1.97 MIL/uL — ABNORMAL LOW (ref 4.22–5.81)
RDW: 15.3 % (ref 11.5–15.5)
WBC: 12.7 10*3/uL — ABNORMAL HIGH (ref 4.0–10.5)
nRBC: 0.3 % — ABNORMAL HIGH (ref 0.0–0.2)

## 2022-06-26 LAB — CBC WITH DIFFERENTIAL/PLATELET
Abs Immature Granulocytes: 0.33 10*3/uL — ABNORMAL HIGH (ref 0.00–0.07)
Basophils Absolute: 0.1 10*3/uL (ref 0.0–0.1)
Basophils Relative: 1 %
Eosinophils Absolute: 0.1 10*3/uL (ref 0.0–0.5)
Eosinophils Relative: 1 %
HCT: 26.3 % — ABNORMAL LOW (ref 39.0–52.0)
Hemoglobin: 8.3 g/dL — ABNORMAL LOW (ref 13.0–17.0)
Immature Granulocytes: 2 %
Lymphocytes Relative: 16 %
Lymphs Abs: 2.2 10*3/uL (ref 0.7–4.0)
MCH: 30.9 pg (ref 26.0–34.0)
MCHC: 31.6 g/dL (ref 30.0–36.0)
MCV: 97.8 fL (ref 80.0–100.0)
Monocytes Absolute: 0.7 10*3/uL (ref 0.1–1.0)
Monocytes Relative: 5 %
Neutro Abs: 10.8 10*3/uL — ABNORMAL HIGH (ref 1.7–7.7)
Neutrophils Relative %: 75 %
Platelets: 60 10*3/uL — ABNORMAL LOW (ref 150–400)
RBC: 2.69 MIL/uL — ABNORMAL LOW (ref 4.22–5.81)
RDW: 15.4 % (ref 11.5–15.5)
WBC: 14.3 10*3/uL — ABNORMAL HIGH (ref 4.0–10.5)
nRBC: 0.1 % (ref 0.0–0.2)

## 2022-06-26 LAB — COMPREHENSIVE METABOLIC PANEL
ALT: 12 U/L (ref 0–44)
AST: 11 U/L — ABNORMAL LOW (ref 15–41)
Albumin: 1.6 g/dL — ABNORMAL LOW (ref 3.5–5.0)
Alkaline Phosphatase: 84 U/L (ref 38–126)
Anion gap: 4 — ABNORMAL LOW (ref 5–15)
BUN: 13 mg/dL (ref 6–20)
CO2: 26 mmol/L (ref 22–32)
Calcium: 7.8 mg/dL — ABNORMAL LOW (ref 8.9–10.3)
Chloride: 112 mmol/L — ABNORMAL HIGH (ref 98–111)
Creatinine, Ser: 1.12 mg/dL (ref 0.61–1.24)
GFR, Estimated: >60 mL/min (ref >60)
Glucose, Bld: 84 mg/dL (ref 70–99)
Potassium: 3.7 mmol/L (ref 3.5–5.1)
Sodium: 142 mmol/L (ref 135–145)
Total Bilirubin: 1 mg/dL (ref 0.3–1.2)
Total Protein: 5.8 g/dL — ABNORMAL LOW (ref 6.5–8.1)

## 2022-06-26 LAB — PREPARE RBC (CROSSMATCH)

## 2022-06-26 LAB — LACTIC ACID, PLASMA: Lactic Acid, Venous: 0.6 mmol/L (ref 0.5–1.9)

## 2022-06-26 LAB — MAGNESIUM: Magnesium: 2.1 mg/dL (ref 1.7–2.4)

## 2022-06-26 MED ORDER — SODIUM CHLORIDE 0.9% IV SOLUTION: Freq: Once | INTRAVENOUS | Status: AC

## 2022-06-26 MED ORDER — MIDODRINE HCL 5 MG PO TABS
15.0000 mg | ORAL_TABLET | Freq: Three times a day (TID) | ORAL | Status: DC
  Administered 2022-06-26 – 2022-07-08 (×31): 15 mg via ORAL
  Filled 2022-06-26 (×31): qty 3

## 2022-06-26 MED ORDER — LIP MEDEX EX OINT
TOPICAL_OINTMENT | CUTANEOUS | Status: DC | PRN
  Filled 2022-06-26: qty 7

## 2022-06-26 NOTE — Progress Notes (Signed)
PROGRESS NOTE    [name]  WEX:[id number] DOB: 1986/03/30 DOA: 06/24/2022 PCP: [name], MD    Brief Narrative:   [name] is a 36 y.o. male with past medical history significant for HIV, bipolar 1 disorder, depression, schizoaffective disorder, PTSD, seizure, chronic buttock wounds, hidradenitis suppurativa who presented to [facility] ED on 9/15 via EMS after he was found altered at home by his home health nurse.  Patient apparently lives alone and is nonambulatory but has a home health nurse who assists him during the day.  Due to patient's mental status, history obtained from EMS/ED provider reports as well as mother.  Mother reported that he drinks approximately 4-5 40 ounce beers a day and has been having some increased wounds/skin breakdown to groin as well as nausea/vomiting and diarrhea.  It is reported that he typically sits in his body weight [place] being seen by his home health nurses; and he typically has been drinking alcohol and not taking in anything else.  In the ED, temperature 96.7 F, HR 119, RR 18, BP 85/45, SPO2 81% on room air.  WBC 15.1, hemoglobin 8.5, platelets 76.  Sodium 137, potassium 3.0, chloride 99, CO2 30, glucose 128, BUN 20, creatinine 1.43, magnesium 2.2, AST 13, ALT 15, total bilirubin 0.8.  Lactic acid 2.0.  Procalcitonin 0.60.  INR 1.3.  COVID-19 PCR negative.  Influenza A/B PCR negative.  FOBT positive.  EtOH level less than 10.  MRSA PCR positive.  CT head without contrast with no acute intracranial abnormality.  Chest x-ray with no active cardiopulmonary disease process.  CT abdomen/pelvis with contrast with tiny hiatal hernia, gas noted within the urinary bladder lumen, urinary bladder wall, and left renal finding suggestive of emphysematous cystitis and pyelitis, irregular dermal thickening of the gluteal soft tissues, no organized fluid collection or subcutaneous soft tissue emphysema/edema to suggest Fourniers gangrene.  Blood cultures obtained.   Patient was started on empiric antibiotics.  TRH consulted for further evaluation management of sepsis likely secondary to cellulitis.  Assessment & Plan:   Sepsis, POA Emphysematous cystitis/left pyelitis Cellulitis groin Chronic hidradenitis suppurativa involving groin creases, pubic region, buttocks Patient presenting to the ED via EMS after being found by home health nurse altered while lying in his own excrement.  On arrival to the ED, patient was hypothermic, elevated to [facility] count of 15.1 with procalcitonin 0.60, lactic acid 2.0.  MRSA PCR positive.  CT abdomen/pelvis notable for emphysematous cystitis/pyelitis and irregular dermal thickening of the gluteal soft tissues consistent with cellulitis.  Urology was consulted, who recommended broad-spectrum antibiotics, Foley catheter placement to allow for maximal decompression with no plans for left ureteral stent placement given he has no hydronephrosis and this is likely an ascending infection. -- Continue Foley catheter for emphysematous cystitis per urology -- WBC 15.1>14.3>12.7 -- Lactic acid 2.0>0.6 -- Blood cultures x2: No growth x1 day -- Urine culture: Pending -- Vancomycin, pharmacy consulted for dosing/monitoring -- Cefepime -- Metronidazole -- Supportive care -- CBC daily -- Repeat imaging tomorrow to assess for resolution of gas per urology  Anemia of chronic medical disease Hemoglobin 7.2 this morning.  Anemia panel with iron 50, TIBC low at 106, ferritin high at 386, folate 11.2, vitamin B12 452; all consistent with anemia of chronic disease. -- Hgb 8.5>7.2>6.1 -- Transfuse 2 unit PRBCs, repeat H&H following transfusion -- CBC daily  Bipolar 1 disorder Schizoaffective disorder PTSD Depression/anxiety -- Lurasidone 120 mg p.o. daily -- Haldol 5 mg p.o. daily -- Lexapro 20  mg p.o. daily -- Hold home Xanax for now   Orthostatic hypotension/autonomic dysfunction -- Midodrine 15 mg p.o. 3 times daily AC  Hx  HIV Follows with infectious disease outpatient, [facility].  Per review of EMR, notes state he is maintained on [place]; but although not listed on home medication list -- Continue Biktarvy -- Outpatient follow-up with infectious disease  EtOH use disorder EtOH level less than 10 on admission.  Counseled on need for complete cessation  Chronic buttock wounds Pressure Injury 02/13/22 Buttocks Bilateral (Active)  02/13/22 2030  Location: Buttocks  Location Orientation: Bilateral  Staging:   Wound Description (Comments):   Present on Admission: Yes  --Wound RN consulted --soap and water cleanse, rinse and gentle pat dry for all affected areas (including axillae) followed by placement of silver hydrofiber (Aquacel Ag+ Advantage) topped with silicone foam to the buttock and thigh lesions. Moisture barrier cream is to be applied to the surrounding macerated areas.    DVT prophylaxis: SCDs Start: 06/24/22 2131    Code Status: DNR Family Communication: No family present at bedside this morning  Disposition Plan:  Level of care: Stepdown Status is: Inpatient Remains inpatient appropriate because: IV antibiotics    Consultants:  Urology, Dr. Abner Greenspan  Procedures:  None  Antimicrobials:  Vancomycin 9/15>> Cefepime 9/15>> Metronidazole 9/15>>   Subjective: Patient seen examined bedside, resting comfortably.  Mental status much improved today, alert and oriented.  Asking if he can go home.  Seen by urology with recommendations of continued Foley catheter and IV antibiotics with repeat imaging planned for tomorrow.  Bear hugger remains off.  Hemoglobin down to 6.1 today and will receive transfusion.  No other questions or concerns at this time.  Denies headache, no dizziness, no chest pain, no palpitations, no shortness of breath, no abdominal pain.  No acute events overnight per nursing staff.  Objective: Vitals:   06/26/22 0800 06/26/22 0852 06/26/22 0853 06/26/22  0910  BP:  (!) 95/54 (!) 95/54 (!) 94/53  Pulse:  90 90 94  Resp:  14 14 17   Temp: (!) 97.4 F (36.3 C) 97.7 F (36.5 C) 97.7 F (36.5 C) (!) 97.5 F (36.4 C)  TempSrc: Oral Oral Oral Axillary  SpO2:  100% 100% 99%  Weight:      Height:        Intake/Output Summary (Last 24 hours) at 06/26/2022 0920 Last data filed at 06/26/2022 0846 Gross per 24 hour  Intake 2301.82 ml  Output 1400 ml  Net 901.82 ml   Filed Weights   06/24/22 2200  Weight: 81.1 kg    Examination:  Physical Exam: GEN: NAD, alert and oriented x 3, chronically ill in appearance, appears older than stated age HEENT: NCAT, PERRL, EOMI, sclera clear, dry mucous membranes, poor dentition PULM: CTAB w/o wheezes/crackles, normal respiratory effort, on room air at rest with SPO2 100% CV: RRR w/o M/G/R GI: abd soft, NTND, NABS, no R/G/M GU: Foley catheter noted draining clear yellow urine MSK: no peripheral edema, moves all extremities independently NEURO: CN II-XII intact, no focal deficits, sensation to light touch intact PSYCH: normal mood/affect Integumentary: Thickened skin with evidence of chronic hidradenitis over his suprapubic, groin, buttock region with several areas of small open wounds with no significant drainage of purulent material.    Data Reviewed: I have personally reviewed following labs and imaging studies  CBC: Recent Labs  Lab 06/24/22 1447 06/24/22 2154 06/25/22 0257 06/26/22 0245  WBC 15.1*  --  12.4* 12.7*  NEUTROABS 11.1*  --   --   --   HGB 8.5* 8.5* 7.2* 6.1*  HCT 26.2* 27.2* 22.1* 19.7*  MCV 96.3  --  95.3 100.0  PLT 76*  --  66* 59*   Basic Metabolic Panel: Recent Labs  Lab 06/24/22 1447 06/25/22 0257 06/26/22 0245  NA 137 136 142  K 3.0* 3.3* 3.7  CL 99 102 112*  CO2 30 27 26   GLUCOSE 128* 92 84  BUN 20 14 13   CREATININE 1.43* 1.08 1.12  CALCIUM 8.3* 7.7* 7.8*  MG 2.2  --  2.1   GFR: Estimated Creatinine Clearance: 102.6 mL/min (by C-G formula based on SCr  of 1.12 mg/dL). Liver Function Tests: Recent Labs  Lab 06/24/22 1447 06/25/22 0257 06/26/22 0245  AST 13* 13* 11*  ALT 15 12 12   ALKPHOS 123 91 84  BILITOT 0.8 0.8 1.0  PROT 7.7 6.1* 5.8*  ALBUMIN 2.2* 1.7* 1.6*   No results for input(s): "LIPASE", "AMYLASE" in the last 168 hours. No results for input(s): "AMMONIA" in the last 168 hours. Coagulation Profile: Recent Labs  Lab 06/24/22 1447 06/25/22 0257  INR 1.3* 1.3*   Cardiac Enzymes: No results for input(s): "CKTOTAL", "CKMB", "CKMBINDEX", "TROPONINI" in the last 168 hours. BNP (last 3 results) No results for input(s): "PROBNP" in the last 8760 hours. HbA1C: No results for input(s): "HGBA1C" in the last 72 hours. CBG: Recent Labs  Lab 06/24/22 1447  GLUCAP 96   Lipid Profile: No results for input(s): "CHOL", "HDL", "LDLCALC", "TRIG", "CHOLHDL", "LDLDIRECT" in the last 72 hours. Thyroid Function Tests: No results for input(s): "TSH", "T4TOTAL", "FREET4", "T3FREE", "THYROIDAB" in the last 72 hours. Anemia Panel: Recent Labs    06/25/22 0804  VITAMINB12 452  FOLATE 11.2  FERRITIN 386*  TIBC 106*  IRON 50  RETICCTPCT 0.8   Sepsis Labs: Recent Labs  Lab 06/24/22 1447 06/25/22 0257 06/26/22 0245  PROCALCITON  --  0.60  --   LATICACIDVEN 2.0*  --  0.6    Recent Results (from the past 240 hour(s))  Blood Culture (routine x 2)     Status: None (Preliminary result)   Collection Time: 06/24/22  2:52 PM   Specimen: BLOOD  Result Value Ref Range Status   Specimen Description   Final    BLOOD SITE NOT SPECIFIED Performed at [facility] [address] [place], [place] [address]    Special Requests   Final    BOTTLES DRAWN AEROBIC AND ANAEROBIC Blood Culture adequate volume Performed at [facility] [address] [place], [place] [address]    Culture   Final    NO GROWTH 2 DAYS Performed at [facility] [address], [place], [place] [address]     Report Status PENDING  Incomplete  Resp Panel by RT-PCR (Flu A&B, Covid) Anterior Nasal Swab     Status: None   Collection Time: 06/24/22  2:58 PM   Specimen: Anterior Nasal Swab  Result Value Ref Range Status   SARS Coronavirus 2 by RT PCR NEGATIVE NEGATIVE Final    Comment: (NOTE) SARS-CoV-2 target nucleic acids are NOT DETECTED.  The SARS-CoV-2 RNA is generally detectable in upper respiratory specimens during the acute phase of infection. The lowest concentration of SARS-CoV-2 viral copies this assay can detect is 138 copies/mL. A negative result does not preclude SARS-Cov-2 infection and should not be used as the sole basis for treatment or other patient management decisions. A negative result  may occur with  improper specimen collection/handling, submission of specimen other than nasopharyngeal swab, presence of viral mutation(s) within the areas targeted by this assay, and inadequate number of viral copies(<138 copies/mL). A negative result must be combined with clinical observations, patient history, and epidemiological information. The expected result is Negative.  Fact Sheet for Patients:  EntrepreneurPulse.com.au  Fact Sheet for Healthcare Providers:  IncredibleEmployment.be  This test is no t yet approved or cleared by the [place] FDA and  has been authorized for detection and/or diagnosis of SARS-CoV-2 by FDA under an Emergency Use Authorization (EUA). This EUA will remain  in effect (meaning this test can be used) for the duration of the COVID-19 declaration under Section 564(b)(1) of the Act, 21 U.S.C.section 360bbb-3(b)(1), unless the authorization is terminated  or revoked sooner.       Influenza A by PCR NEGATIVE NEGATIVE Final   Influenza B by PCR NEGATIVE NEGATIVE Final    Comment: (NOTE) The Xpert Xpress SARS-CoV-2/FLU/RSV plus assay is intended as an aid in the diagnosis of influenza from Nasopharyngeal swab  specimens and should not be used as a sole basis for treatment. Nasal washings and aspirates are unacceptable for Xpert Xpress SARS-CoV-2/FLU/RSV testing.  Fact Sheet for Patients: EntrepreneurPulse.com.au  Fact Sheet for Healthcare Providers: IncredibleEmployment.be  This test is not yet approved or cleared by the [place] FDA and has been authorized for detection and/or diagnosis of SARS-CoV-2 by FDA under an Emergency Use Authorization (EUA). This EUA will remain in effect (meaning this test can be used) for the duration of the COVID-19 declaration under Section 564(b)(1) of the Act, 21 U.S.C. section 360bbb-3(b)(1), unless the authorization is terminated or revoked.  Performed at [facility], [place] [address], [place], [place] [address]   MRSA Next Gen by PCR, Nasal     Status: Abnormal   Collection Time: 06/24/22 10:00 PM   Specimen: Nasal Mucosa; Nasal Swab  Result Value Ref Range Status   MRSA by PCR Next Gen DETECTED (A) NOT DETECTED Final    Comment: CRITICAL RESULT CALLED TO, READ BACK BY AND VERIFIED WITH: Dorise Bullion, RN (NOTE) The GeneXpert MRSA Assay (FDA approved for NASAL specimens only), is one component of a comprehensive MRSA colonization surveillance program. It is not intended to diagnose MRSA infection nor to guide or monitor treatment for MRSA infections. Test performance is not FDA approved in patients less than 93 years old. Performed at [facility], [place] [address], [place], [place] [address]   Blood Culture (routine x 2)     Status: None (Preliminary result)   Collection Time: 06/24/22 10:02 PM   Specimen: BLOOD LEFT HAND  Result Value Ref Range Status   Specimen Description   Final    BLOOD LEFT HAND Performed at [facility] [address] [place], [place] [address]    Special Requests   Final    BOTTLES DRAWN AEROBIC ONLY Blood Culture  results may not be optimal due to an inadequate volume of blood received in culture bottles Performed at [facility] [address] [place], [place] [address]    Culture   Final    NO GROWTH 1 DAY Performed at [facility], [place] [address] [place], [place] [address]    Report Status PENDING  Incomplete         Radiology Studies: CT ABDOMEN PELVIS W CONTRAST  Result Date: 06/24/2022 CLINICAL DATA:  Abdominal pain, acute, nonlocalized eval for  fournier's gangrene EXAM: CT ABDOMEN AND PELVIS WITH CONTRAST TECHNIQUE: Multidetector CT imaging of the abdomen and pelvis was performed using the standard protocol following bolus administration of intravenous contrast. RADIATION DOSE REDUCTION: This exam was performed according to the departmental dose-optimization program which includes automated exposure control, adjustment of the mA and/or kV according to patient size and/or use of iterative reconstruction technique. CONTRAST:  136m OMNIPAQUE IOHEXOL 300 MG/ML  SOLN COMPARISON:  None Available. FINDINGS: Lower chest: Tiny hiatal hernia.  No acute abnormality. Hepatobiliary: No focal liver abnormality. No gallstones, gallbladder wall thickening, or pericholecystic fluid. No biliary dilatation. Pancreas: No focal lesion. Normal pancreatic contour. No surrounding inflammatory changes. No main pancreatic ductal dilatation. Spleen: Normal in size without focal abnormality. Adrenals/Urinary Tract: No adrenal nodule bilaterally. Bilateral kidneys enhance symmetrically. No hydronephrosis. No hydroureter. Air-fluid level within the urinary bladder lumen. Query gas within the urinary bladder wall best evaluated on coronal imaging. Stomach/Bowel: Stomach is within normal limits. No evidence of bowel wall thickening or dilatation. Appendix appears normal. Vascular/Lymphatic: No abdominal aorta or iliac aneurysm. Mild atherosclerotic plaque of the aorta and its branches. No abdominal,  pelvic, or inguinal lymphadenopathy. Reproductive: Prostate is unremarkable. Other: No intraperitoneal free fluid. No intraperitoneal free gas. No organized fluid collection. Musculoskeletal: No subcutaneus soft tissue edema, emphysema, or on ice fluid collection within the soft tissues. Irregular dermal thickening of the gluteal soft tissues noted. Small fat containing umbilical hernia with an abdominal defect of 1.5 cm. No suspicious lytic or blastic osseous lesions. No acute displaced fracture. Multilevel degenerative changes of the spine. IMPRESSION: 1. Tiny hiatal hernia. 2. Gas noted within the urinary bladder lumen, urinary bladder wall, and left renal findings suggestive of emphysematous cystitis and pyelitis. 3. Irregular dermal thickening of the gluteal soft tissues. Correlate with physical exam for focal mass lesions versus cellulitis. 4. No organized fluid collection or subcutaneus soft tissue emphysema/edema to suggest Fournier's gangrene-however this cannot be fully excluded as this is a clinical diagnosis. 5.  Aortic Atherosclerosis (ICD10-I70.0). These results were called by telephone at the time of interpretation on 06/24/2022 at 5:40 pm to provider Dr. RWyvonnia Dusky who verbally acknowledged these results. Electronically Signed   By: MIven FinnM.D.   On: 06/24/2022 18:11   DG Chest Port 1 View  Result Date: 06/24/2022 CLINICAL DATA:  Altered mental status.  Possible sepsis. EXAM: PORTABLE CHEST 1 VIEW COMPARISON:  Chest x-ray dated Mar 04, 2022. FINDINGS: The heart size and mediastinal contours are within normal limits. Both lungs are clear. The visualized skeletal structures are unremarkable. IMPRESSION: No active disease. Electronically Signed   By: WTitus DubinM.D.   On: 06/24/2022 15:22   CT Head Wo Contrast  Result Date: 06/24/2022 CLINICAL DATA:  Altered mental status EXAM: CT HEAD WITHOUT CONTRAST TECHNIQUE: Contiguous axial images were obtained from the base of the skull through  the vertex without intravenous contrast. RADIATION DOSE REDUCTION: This exam was performed according to the departmental dose-optimization program which includes automated exposure control, adjustment of the mA and/or kV according to patient size and/or use of iterative reconstruction technique. COMPARISON:  Head CT 02/13/2022 FINDINGS: Brain: There is no acute intracranial hemorrhage, extra-axial fluid collection, or acute infarct Parenchymal volume is normal. The ventricles are normal in size. Gray-white differentiation is preserved. There is no mass lesion.  There is no mass effect or midline shift. Vascular: No hyperdense vessel or unexpected calcification. Skull: Normal. Negative for fracture or focal lesion. Sinuses/Orbits: The imaged paranasal sinuses are clear. The  globes and orbits are unremarkable. Other: None. IMPRESSION: No acute intracranial pathology. Electronically Signed   By: Valetta Mole M.D.   On: 06/24/2022 15:18        Scheduled Meds:  sodium chloride   Intravenous Once   bictegravir-emtricitabine-tenofovir AF  1 tablet Oral QAC breakfast   Chlorhexidine Gluconate Cloth  6 each Topical Q0600   escitalopram  20 mg Oral Daily   feeding supplement  237 mL Oral TID BM   folic acid  1 mg Oral Daily   haloperidol  5 mg Oral Daily   lurasidone  120 mg Oral Q breakfast   midodrine  15 mg Oral TID WC   multivitamin with minerals  1 tablet Oral Daily   mupirocin ointment  1 Application Nasal BID   nystatin   Topical BID   pantoprazole  40 mg Oral QHS   thiamine  100 mg Oral Daily   valACYclovir  1,000 mg Oral Daily   Continuous Infusions:  ceFEPime (MAXIPIME) IV Stopped (06/26/22 0704)   lactated ringers Stopped (06/25/22 1202)   metronidazole Stopped (06/26/22 0454)   vancomycin Stopped (06/26/22 0407)     LOS: 2 days    Time spent: 50 minutes spent on chart review, discussion with nursing staff, consultants, updating family and interview/physical exam; more than 50% of  that time was spent in counseling and/or coordination of care.    Suda Forbess J [place] DO [facility] Available via Epic secure chat 7am-7pm After these hours, please refer to coverage provider listed on [email or web address] 06/26/2022, 9:20 AM

## 2022-06-26 NOTE — Progress Notes (Signed)
Paged and messaged provider for critical hgb of 6.1. waiting for orders

## 2022-06-27 ENCOUNTER — Inpatient Hospital Stay (HOSPITAL_COMMUNITY): Payer: Medicare HMO

## 2022-06-27 DIAGNOSIS — N179 Acute kidney failure, unspecified: Secondary | ICD-10-CM | POA: Diagnosis not present

## 2022-06-27 DIAGNOSIS — G9341 Metabolic encephalopathy: Secondary | ICD-10-CM | POA: Diagnosis not present

## 2022-06-27 DIAGNOSIS — D510 Vitamin B12 deficiency anemia due to intrinsic factor deficiency: Secondary | ICD-10-CM | POA: Diagnosis not present

## 2022-06-27 DIAGNOSIS — A419 Sepsis, unspecified organism: Secondary | ICD-10-CM | POA: Diagnosis not present

## 2022-06-27 LAB — CBC
HCT: 24.6 % — ABNORMAL LOW (ref 39.0–52.0)
Hemoglobin: 7.9 g/dL — ABNORMAL LOW (ref 13.0–17.0)
MCH: 31.3 pg (ref 26.0–34.0)
MCHC: 32.1 g/dL (ref 30.0–36.0)
MCV: 97.6 fL (ref 80.0–100.0)
Platelets: 67 10*3/uL — ABNORMAL LOW (ref 150–400)
RBC: 2.52 MIL/uL — ABNORMAL LOW (ref 4.22–5.81)
RDW: 15.8 % — ABNORMAL HIGH (ref 11.5–15.5)
WBC: 17.5 10*3/uL — ABNORMAL HIGH (ref 4.0–10.5)
nRBC: 0.2 % (ref 0.0–0.2)

## 2022-06-27 LAB — URINE CULTURE: Culture: NO GROWTH

## 2022-06-27 LAB — HEMOGLOBIN AND HEMATOCRIT, BLOOD
HCT: 26.3 % — ABNORMAL LOW (ref 39.0–52.0)
Hemoglobin: 8.4 g/dL — ABNORMAL LOW (ref 13.0–17.0)

## 2022-06-27 LAB — BASIC METABOLIC PANEL
Anion gap: 4 — ABNORMAL LOW (ref 5–15)
BUN: 12 mg/dL (ref 6–20)
CO2: 25 mmol/L (ref 22–32)
Calcium: 7.9 mg/dL — ABNORMAL LOW (ref 8.9–10.3)
Chloride: 111 mmol/L (ref 98–111)
Creatinine, Ser: 1.29 mg/dL — ABNORMAL HIGH (ref 0.61–1.24)
GFR, Estimated: >60 mL/min (ref >60)
Glucose, Bld: 108 mg/dL — ABNORMAL HIGH (ref 70–99)
Potassium: 3.6 mmol/L (ref 3.5–5.1)
Sodium: 140 mmol/L (ref 135–145)

## 2022-06-27 MED ORDER — OXYCODONE HCL 5 MG PO TABS
5.0000 mg | ORAL_TABLET | ORAL | Status: DC | PRN
  Administered 2022-06-27 – 2022-06-28 (×2): 5 mg via ORAL
  Filled 2022-06-27 (×2): qty 1

## 2022-06-27 MED ORDER — MORPHINE SULFATE (PF) 2 MG/ML IV SOLN
1.0000 mg | INTRAVENOUS | Status: DC | PRN
  Administered 2022-06-27: 1 mg via INTRAVENOUS
  Filled 2022-06-27: qty 1

## 2022-06-27 MED ORDER — SODIUM CHLORIDE 0.9 % IV SOLN: INTRAVENOUS | Status: DC | PRN

## 2022-06-27 NOTE — TOC Initial Note (Signed)
Transition of Care [facility]) - Initial/Assessment Note    Patient Details  Name: [name] MRN: [id number] Date of Birth: 11-01-1985  Transition of Care [facility]) CM/SW Contact:    Servando Snare, LCSW Phone Number: 06/27/2022, 12:34 PM  Clinical Narrative:        [facility] consulted for SA resources. Resources added to patient AVS.   Patient is from home with home health aide services 3 hours a day. Patient I is in wheelchair at baseline and requires assistance with ADLS. SNF recommendation past hospitalization. Patient refused the day he was to transfer. PT went home with home health. TOC to follow for DC needs.   PLAN: TBD     Expected Discharge Plan: [place] Barriers to Discharge: Continued Medical Work up   Patient Goals and CMS Choice        Expected Discharge Plan and Services Expected Discharge Plan: [place] In-house Referral: NA   Post Acute Care Choice: Durable Medical Equipment, Home Health Living arrangements for the past 2 months: Apartment                                      Prior Living Arrangements/Services Living arrangements for the past 2 months: Apartment Lives with:: Self Patient language and need for interpreter reviewed:: Yes        Need for Family Participation in Patient Care: Yes (Comment) Care giver support system in place?: Yes (comment)   Criminal Activity/Legal Involvement Pertinent to Current Situation/Hospitalization: No - Comment as needed  Activities of Daily Living Home Assistive Devices/Equipment: Wheelchair (per chart) ADL Screening (condition at time of admission) Patient's cognitive ability adequate to safely complete daily activities?: No Is the patient deaf or have difficulty hearing?: No Does the patient have difficulty seeing, even when wearing glasses/contacts?: No Does the patient have difficulty concentrating, remembering, or making decisions?: Yes Patient able to express need  for assistance with ADLs?: No (pt very weak and having trouble speaking) Does the patient have difficulty dressing or bathing?: Yes Independently performs ADLs?: No Communication: Independent Dressing (OT): Needs assistance Is this a change from baseline?: Pre-admission baseline Grooming: Needs assistance Is this a change from baseline?: Pre-admission baseline Feeding: Needs assistance Is this a change from baseline?: Pre-admission baseline Bathing: Needs assistance Is this a change from baseline?: Pre-admission baseline Toileting: Needs assistance Is this a change from baseline?: Pre-admission baseline In/Out Bed: Needs assistance Is this a change from baseline?: Pre-admission baseline Walks in Home: Dependent Is this a change from baseline?: Pre-admission baseline Does the patient have difficulty walking or climbing stairs?: Yes Weakness of Legs: Both Weakness of Arms/Hands: Both  Permission Sought/Granted   Permission granted to share information with : No              Emotional Assessment Appearance:: Appears stated age Attitude/Demeanor/Rapport: Unable to Assess Affect (typically observed): Unable to Assess Orientation: : Oriented to Self, Oriented to Place, Oriented to  Time, Oriented to Situation, Fluctuating Orientation (Suspected and/or reported Sundowners) Alcohol / Substance Use: Alcohol Use Psych Involvement: No (comment)  Admission diagnosis:  Metabolic encephalopathy [I78.67] Dehydration [E86.0] HIV disease ([place]) [B20] Sepsis (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   Noncompliance with medications 02/20/2022   Hyperphosphatemia 78/93/8101   Alcoholic cirrhosis of liver ([place]) 02/17/2022   Chronic alcohol  use 02/17/2022   Bipolar disorder ([place]) 02/17/2022   Sepsis ([place]) 02/13/2022   Malnutrition of moderate degree 10/20/2021   Palliative care by specialist    DNR (do not resuscitate) 10/14/2021   Aspiration pneumonia ([place]) 10/14/2021   FTT (failure to thrive) in adult 10/14/2021   Dysphagia 10/14/2021   Coagulopathy ([place]) 10/14/2021   Protein-calorie malnutrition, severe ([place]) 10/09/2021    Class: Chronic   Avascular necrosis of femoral head, left ([place]) 75/07/2584   Alcoholic cirrhosis of liver without ascites ([place]) 10/07/2021   Cellulitis of multiple sites of buttock 10/07/2021   Hypotension    Hypoxia 08/24/2021   Left hip pain    Transaminitis    Acute metabolic encephalopathy 27/78/2423   Cellulitis 05/07/2021   Lymphadenopathy 09/29/2020   Anemia    Upper urinary tract infection    Sepsis secondary to UTI ([place]) 03/14/2020   Cellulitis of groin 03/14/2020   HIV disease ([place])    Macrocytic anemia    Thrombocytopenia (H[facility])    Prolonged QT interval    Acute respiratory failure due to COVID-19 ([place]) 10/27/2019   GERD (gastroesophageal reflux disease) 10/27/2019   Hypokalemia 10/27/2019   Peripheral neuropathy 10/01/2019   PTSD (post-traumatic stress disorder) 07/23/2018   Dizziness and giddiness 02/01/2016   Migraine headache 02/01/2016   Schizoaffective disorder, depressive type ([place]) 05/13/2015   Severe alcohol dependence ([place]) 05/13/2015   Suicidal ideation 01/12/2014   PCP:  [name], MD Pharmacy:   [place], [place] - [address] [place] [address] Phone: [phone] Fax: [phone]     Social Determinants of Health (SDOH) Interventions    Readmission Risk Interventions    02/15/2022    2:35 PM 08/24/2021    4:00 PM 08/24/2021   11:54 AM  Readmission Risk Prevention Plan  Transportation Screening Complete Complete   PCP or Specialist Appt within 3-5 Days  Complete Complete  HRI or [place]  Complete Complete  Social Work Consult for [place] Planning/Counseling   Complete  Palliative Care Screening  Complete Complete  Medication Review Press photographer) Complete Complete Complete  PCP or Specialist appointment within 3-5 days of discharge Complete    HRI or [place] Complete    SW Recovery Care/Counseling Consult Complete    [place] Not Applicable

## 2022-06-27 NOTE — Progress Notes (Addendum)
3 Days Post-Op Subjective: Denies pain. No nausea or emesis.  Objective: Vital signs in last 24 hours: Temp:  [97.4 F (36.3 C)-98.9 F (37.2 C)] 98.6 F (37 C) (09/18 0400) Pulse Rate:  [87-99] 94 (09/18 0600) Resp:  [12-22] 15 (09/18 0600) BP: (82-111)/(44-71) 111/71 (09/18 0600) SpO2:  [95 %-100 %] 97 % (09/18 0600)  Intake/Output from previous day: 09/17 0701 - 09/18 0700 In: 2226.1 [P.O.:500; I.V.:82.5; Blood:768.5; IV Piggyback:875.1] Out: 1700 [Urine:1700] Intake/Output this shift: No intake/output data recorded. UOP: 1.7L clear yellow via foley  Physical Exam:  General: Alert and oriented CV: RRR Lungs: Clear Abdomen: Soft, ND, NT GU: Chronic hidradenitis over suprapubic region and bilateral groin creases.  No areas of fluctuance, induration, or necrosis over scrotum or perineum. Ext: NT, No erythema  Lab Results: Recent Labs    06/26/22 0245 06/26/22 1157 06/27/22 0254  HGB 6.1* 8.3* 7.9*  HCT 19.7* 26.3* 24.6*   BMET Recent Labs    06/26/22 0245 06/27/22 0254  NA 142 140  K 3.7 3.6  CL 112* 111  CO2 26 25  GLUCOSE 84 108*  BUN 13 12  CREATININE 1.12 1.29*  CALCIUM 7.8* 7.9*     Studies/Results: No results found.  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  Appreciate wound care assistance -Following   LOS: 3 days   Matt R. Sumiya Mamaril MD 06/27/2022, 7:55 AM [facility]  Pager: [phone]

## 2022-06-27 NOTE — Progress Notes (Signed)
PROGRESS NOTE    [name]  HXT:[id number] DOB: [birth year] DOA: 06/24/2022 PCP: [name], MD    Brief Narrative:   [name] is a 36 y.o. male with past medical history significant for HIV, bipolar 1 disorder, depression, schizoaffective disorder, PTSD, seizure, chronic buttock wounds, hidradenitis suppurativa who presented to [facility] ED on 9/15 via EMS after he was found altered at home by his home health nurse.  Patient apparently lives alone and is nonambulatory but has a home health nurse who assists him during the day.  Due to patient's mental status, history obtained from EMS/ED provider reports as well as mother.  Mother reported that he drinks approximately 4-5 40 ounce beers a day and has been having some increased wounds/skin breakdown to groin as well as nausea/vomiting and diarrhea.  It is reported that he typically sits in his body weight [place] being seen by his home health nurses; and he typically has been drinking alcohol and not taking in anything else.  In the ED, temperature 96.7 F, HR 119, RR 18, BP 85/45, SPO2 81% on room air.  WBC 15.1, hemoglobin 8.5, platelets 76.  Sodium 137, potassium 3.0, chloride 99, CO2 30, glucose 128, BUN 20, creatinine 1.43, magnesium 2.2, AST 13, ALT 15, total bilirubin 0.8.  Lactic acid 2.0.  Procalcitonin 0.60.  INR 1.3.  COVID-19 PCR negative.  Influenza A/B PCR negative.  FOBT positive.  EtOH level less than 10.  MRSA PCR positive.  CT head without contrast with no acute intracranial abnormality.  Chest x-ray with no active cardiopulmonary disease process.  CT abdomen/pelvis with contrast with tiny hiatal hernia, gas noted within the urinary bladder lumen, urinary bladder wall, and left renal finding suggestive of emphysematous cystitis and pyelitis, irregular dermal thickening of the gluteal soft tissues, no organized fluid collection or subcutaneous soft tissue emphysema/edema to suggest Fourniers gangrene.  Blood cultures obtained.   Patient was started on empiric antibiotics.  TRH consulted for further evaluation management of sepsis likely secondary to cellulitis.  Assessment & Plan:   Sepsis, POA Emphysematous cystitis/left pyelitis Cellulitis groin Chronic hidradenitis suppurativa involving groin creases, pubic region, buttocks Patient presenting to the ED via EMS after being found by home health nurse altered while lying in his own excrement.  On arrival to the ED, patient was hypothermic, elevated to [facility] count of 15.1 with procalcitonin 0.60, lactic acid 2.0.  MRSA PCR positive.  CT abdomen/pelvis notable for emphysematous cystitis/pyelitis and irregular dermal thickening of the gluteal soft tissues consistent with cellulitis.  Urology was consulted, who recommended broad-spectrum antibiotics, Foley catheter placement to allow for maximal decompression with no plans for left ureteral stent placement given he has no hydronephrosis and this is likely an ascending infection.  Repeat CT abdomen/pelvis 9/18 shows resolution of gas to left renal collecting system, no gas noted within bladder. -- Continue Foley catheter for emphysematous cystitis per urology -- WBC 15.1>14.3>12.7> -- Lactic acid 2.0>0.6 -- Blood cultures x2: No growth x 2 day -- Urine culture: No growth -- Vancomycin, pharmacy consulted for dosing/monitoring -- Cefepime -- Metronidazole -- Supportive care -- CBC daily  Anemia of chronic medical disease Hemoglobin 7.2 this morning.  Anemia panel with iron 50, TIBC low at 106, ferritin high at 386, folate 11.2, vitamin B12 452; all consistent with anemia of chronic disease. -- Hgb 8.5>7.2>6.1>8.3>7.9 -- Transfuse 2 unit PRBCs, repeat H&H following transfusion -- CBC daily  Bipolar 1 disorder Schizoaffective disorder PTSD Depression/anxiety -- Lurasidone 120 mg p.o. daily --  Haldol 5 mg p.o. daily -- Lexapro 20 mg p.o. daily -- Hold home Xanax for now   Orthostatic hypotension/autonomic  dysfunction -- Midodrine 15 mg p.o. 3 times daily AC  Hx HIV Follows with infectious disease outpatient, [facility].  Per review of EMR, notes state he is maintained on [place]; but although not listed on home medication list -- Continue Biktarvy -- Outpatient follow-up with infectious disease  EtOH use disorder EtOH level less than 10 on admission.  Counseled on need for complete cessation  Chronic buttock wounds Pressure Injury 02/13/22 Buttocks Bilateral (Active)  02/13/22 2030  Location: Buttocks  Location Orientation: Bilateral  Staging:   Wound Description (Comments):   Present on Admission: Yes  --Wound RN consulted --soap and water cleanse, rinse and gentle pat dry for all affected areas (including axillae) followed by placement of silver hydrofiber (Aquacel Ag+ Advantage) topped with silicone foam to the buttock and thigh lesions. Moisture barrier cream is to be applied to the surrounding macerated areas.    DVT prophylaxis: SCDs Start: 06/24/22 2131    Code Status: DNR Family Communication: No family present at bedside this morning  Disposition Plan:  Level of care: Stepdown Status is: Inpatient Remains inpatient appropriate because: IV antibiotics    Consultants:  Urology, Dr. Abner Greenspan  Procedures:  None  Antimicrobials:  Vancomycin 9/15>> Cefepime 9/15>> Metronidazole 9/15>>   Subjective: Patient seen examined bedside, resting comfortably.  No specific complaints this morning.  Repeat CT abdomen/pelvis notable for resolution of gas within the left renal collecting system.  Continues with Foley catheter in place and empiric antibiotics per urology.  Blood cultures with no growth so far and urine culture no growth.  Hemoglobin up to 7.9 this morning.  No other questions or concerns at this time.  Denies headache, no dizziness, no chest pain, no palpitations, no shortness of breath, no abdominal pain.  No acute events overnight per nursing  staff.  Objective: Vitals:   06/27/22 0758 06/27/22 0800 06/27/22 0845 06/27/22 0900  BP: 115/76 115/76 116/79 92/64  Pulse: 90 89 92 91  Resp: 17 16 16 16   Temp: 97.9 F (36.6 C)     TempSrc: Oral     SpO2: 98% 98% 97% 98%  Weight:      Height:        Intake/Output Summary (Last 24 hours) at 06/27/2022 0948 Last data filed at 06/27/2022 0800 Gross per 24 hour  Intake 2420.2 ml  Output 1525 ml  Net 895.2 ml   Filed Weights   06/24/22 2200  Weight: 81.1 kg    Examination:  Physical Exam: GEN: NAD, alert and oriented x 3, chronically ill in appearance, appears older than stated age HEENT: NCAT, PERRL, EOMI, sclera clear, dry mucous membranes, poor dentition PULM: CTAB w/o wheezes/crackles, normal respiratory effort, on room air at rest with SPO2 96% CV: RRR w/o M/G/R GI: abd soft, NTND, NABS, no R/G/M GU: Foley catheter noted draining clear yellow urine MSK: no peripheral edema, moves all extremities independently NEURO: CN II-XII intact, no focal deficits, sensation to light touch intact PSYCH: normal mood/affect Integumentary: Thickened skin with evidence of chronic hidradenitis over his suprapubic, groin, buttock region with several areas of small open wounds with no significant drainage of purulent material.    Data Reviewed: I have personally reviewed following labs and imaging studies  CBC: Recent Labs  Lab 06/24/22 1447 06/24/22 2154 06/25/22 0257 06/26/22 0245 06/26/22 1157 06/27/22 0254  WBC 15.1*  --  12.4*  12.7* 14.3* 17.5*  NEUTROABS 11.1*  --   --   --  10.8*  --   HGB 8.5* 8.5* 7.2* 6.1* 8.3* 7.9*  HCT 26.2* 27.2* 22.1* 19.7* 26.3* 24.6*  MCV 96.3  --  95.3 100.0 97.8 97.6  PLT 76*  --  66* 59* 60* 67*   Basic Metabolic Panel: Recent Labs  Lab 06/24/22 1447 06/25/22 0257 06/26/22 0245 06/27/22 0254  NA 137 136 142 140  K 3.0* 3.3* 3.7 3.6  CL 99 102 112* 111  CO2 30 27 26 25   GLUCOSE 128* 92 84 108*  BUN 20 14 13 12   CREATININE  1.43* 1.08 1.12 1.29*  CALCIUM 8.3* 7.7* 7.8* 7.9*  MG 2.2  --  2.1  --    GFR: Estimated Creatinine Clearance: 89.1 mL/min (A) (by C-G formula based on SCr of 1.29 mg/dL (H)). Liver Function Tests: Recent Labs  Lab 06/24/22 1447 06/25/22 0257 06/26/22 0245  AST 13* 13* 11*  ALT 15 12 12   ALKPHOS 123 91 84  BILITOT 0.8 0.8 1.0  PROT 7.7 6.1* 5.8*  ALBUMIN 2.2* 1.7* 1.6*   No results for input(s): "LIPASE", "AMYLASE" in the last 168 hours. No results for input(s): "AMMONIA" in the last 168 hours. Coagulation Profile: Recent Labs  Lab 06/24/22 1447 06/25/22 0257  INR 1.3* 1.3*   Cardiac Enzymes: No results for input(s): "CKTOTAL", "CKMB", "CKMBINDEX", "TROPONINI" in the last 168 hours. BNP (last 3 results) No results for input(s): "PROBNP" in the last 8760 hours. HbA1C: No results for input(s): "HGBA1C" in the last 72 hours. CBG: Recent Labs  Lab 06/24/22 1447  GLUCAP 96   Lipid Profile: No results for input(s): "CHOL", "HDL", "LDLCALC", "TRIG", "CHOLHDL", "LDLDIRECT" in the last 72 hours. Thyroid Function Tests: No results for input(s): "TSH", "T4TOTAL", "FREET4", "T3FREE", "THYROIDAB" in the last 72 hours. Anemia Panel: Recent Labs    06/25/22 0804  VITAMINB12 452  FOLATE 11.2  FERRITIN 386*  TIBC 106*  IRON 50  RETICCTPCT 0.8   Sepsis Labs: Recent Labs  Lab 06/24/22 1447 06/25/22 0257 06/26/22 0245  PROCALCITON  --  0.60  --   LATICACIDVEN 2.0*  --  0.6    Recent Results (from the past 240 hour(s))  Blood Culture (routine x 2)     Status: None (Preliminary result)   Collection Time: 06/24/22  2:52 PM   Specimen: BLOOD  Result Value Ref Range Status   Specimen Description   Final    BLOOD SITE NOT SPECIFIED Performed at [facility] [address], [place], [place] [address]    Special Requests   Final    BOTTLES DRAWN AEROBIC AND ANAEROBIC Blood Culture adequate volume Performed at [facility]  [address] [place], [place] [address]    Culture   Final    NO GROWTH 3 DAYS Performed at [facility], [place] [address], [place], [place] [address]    Report Status PENDING  Incomplete  Resp Panel by RT-PCR (Flu A&B, Covid) Anterior Nasal Swab     Status: None   Collection Time: 06/24/22  2:58 PM   Specimen: Anterior Nasal Swab  Result Value Ref Range Status   SARS Coronavirus 2 by RT PCR NEGATIVE NEGATIVE Final    Comment: (NOTE) SARS-CoV-2 target nucleic acids are NOT DETECTED.  The SARS-CoV-2 RNA is generally detectable in upper respiratory specimens during the acute phase of infection. The lowest concentration of SARS-CoV-2 viral copies this assay can  detect is 138 copies/mL. A negative result does not preclude SARS-Cov-2 infection and should not be used as the sole basis for treatment or other patient management decisions. A negative result may occur with  improper specimen collection/handling, submission of specimen other than nasopharyngeal swab, presence of viral mutation(s) within the areas targeted by this assay, and inadequate number of viral copies(<138 copies/mL). A negative result must be combined with clinical observations, patient history, and epidemiological information. The expected result is Negative.  Fact Sheet for Patients:  EntrepreneurPulse.com.au  Fact Sheet for Healthcare Providers:  IncredibleEmployment.be  This test is no t yet approved or cleared by the [place] FDA and  has been authorized for detection and/or diagnosis of SARS-CoV-2 by FDA under an Emergency Use Authorization (EUA). This EUA will remain  in effect (meaning this test can be used) for the duration of the COVID-19 declaration under Section 564(b)(1) of the Act, 21 U.S.C.section 360bbb-3(b)(1), unless the authorization is terminated  or revoked sooner.       Influenza A by PCR NEGATIVE NEGATIVE Final   Influenza B by PCR NEGATIVE  NEGATIVE Final    Comment: (NOTE) The Xpert Xpress SARS-CoV-2/FLU/RSV plus assay is intended as an aid in the diagnosis of influenza from Nasopharyngeal swab specimens and should not be used as a sole basis for treatment. Nasal washings and aspirates are unacceptable for Xpert Xpress SARS-CoV-2/FLU/RSV testing.  Fact Sheet for Patients: EntrepreneurPulse.com.au  Fact Sheet for Healthcare Providers: IncredibleEmployment.be  This test is not yet approved or cleared by the [place] FDA and has been authorized for detection and/or diagnosis of SARS-CoV-2 by FDA under an Emergency Use Authorization (EUA). This EUA will remain in effect (meaning this test can be used) for the duration of the COVID-19 declaration under Section 564(b)(1) of the Act, 21 U.S.C. section 360bbb-3(b)(1), unless the authorization is terminated or revoked.  Performed at [facility], [place] [address], [place], [place] [address]   MRSA Next Gen by PCR, Nasal     Status: Abnormal   Collection Time: 06/24/22 10:00 PM   Specimen: Nasal Mucosa; Nasal Swab  Result Value Ref Range Status   MRSA by PCR Next Gen DETECTED (A) NOT DETECTED Final    Comment: CRITICAL RESULT CALLED TO, READ BACK BY AND VERIFIED WITH: Dorise Bullion, RN (NOTE) The GeneXpert MRSA Assay (FDA approved for NASAL specimens only), is one component of a comprehensive MRSA colonization surveillance program. It is not intended to diagnose MRSA infection nor to guide or monitor treatment for MRSA infections. Test performance is not FDA approved in patients less than 74 years old. Performed at [facility], [place] [address], [place], [place] [address]   Blood Culture (routine x 2)     Status: None (Preliminary result)   Collection Time: 06/24/22 10:02 PM   Specimen: BLOOD LEFT HAND  Result Value Ref Range Status   Specimen Description   Final    BLOOD LEFT  HAND Performed at [facility] [address], [place], [place] [address]    Special Requests   Final    BOTTLES DRAWN AEROBIC ONLY Blood Culture results may not be optimal due to an inadequate volume of blood received in culture bottles Performed at [facility] [address] [place], [place] [address]    Culture   Final    NO GROWTH 2 DAYS Performed at [facility] [address], [place], [place] [address]    Report Status  PENDING  Incomplete  Urine Culture     Status: None   Collection Time: 06/26/22  5:29 AM   Specimen: Urine, Catheterized  Result Value Ref Range Status   Specimen Description   Final    URINE, CATHETERIZED Performed at [facility], [place] [address], [name], [place] [address]    Special Requests   Final    NONE Performed at [facility], [place] [address] [place], [place] [address]    Culture   Final    NO GROWTH Performed at [facility], [place] [address] [place], [place] [address]    Report Status 06/27/2022 FINAL  Final         Radiology Studies: CT ABDOMEN PELVIS WO CONTRAST  Result Date: 06/27/2022 CLINICAL DATA:  Follow-up urinary tract infection. EXAM: CT ABDOMEN AND PELVIS WITHOUT CONTRAST TECHNIQUE: Multidetector CT imaging of the abdomen and pelvis was performed following the standard protocol without IV contrast. RADIATION DOSE REDUCTION: This exam was performed according to the departmental dose-optimization program which includes automated exposure control, adjustment of the mA and/or kV according to patient size and/or use of iterative reconstruction technique. COMPARISON:  CT scan 06/24/2022 FINDINGS: Lower chest: Nodular interstitial pattern in the lung bases with patchy areas of atelectasis. Findings suggest atypical/viral pneumonia or other atypical inflammatory process. No focal pulmonary infiltrates. No pleural effusions. Hepatobiliary: No hepatic  lesions or intrahepatic biliary dilatation. Gallbladder is grossly normal. No common bile duct dilatation. Pancreas: No mass, inflammation or ductal dilatation. Spleen: Normal size.  No focal lesions. Adrenals/Urinary Tract: Adrenal glands are normal. Resolution of gas in the left renal collecting system. No renal calculi or renal lesions. There is a Foley catheter in a decompressed bladder with a small amount of residual gas which is not unexpected. No gas is seen in the bladder wall. No bladder calculi. Stomach/Bowel: The stomach duodenum and small bowel are grossly normal. Moderate stool throughout colon suggesting constipation. The terminal ileum and appendix are normal. Vascular/Lymphatic: Stable age advanced atherosclerotic calcification involving the aorta and iliac arteries but no aneurysm. Stable borderline enlarged upper abdominal and retroperitoneal lymph nodes. Reproductive: The prostate gland and seminal vesicles unremarkable. Other: Stable periumbilical abdominal wall hernia containing fat. Stable borderline enlarged inguinal lymph nodes likely reactive. 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 IMPRESSION: 1. Nodular interstitial pattern in the lung bases with patchy areas of atelectasis. Findings suggest atypical/viral pneumonia or other atypical inflammatory process. 2. Resolution of gas in the left renal collecting system. Interval placement of Foley catheter in the bladder. No gas in the bladder is identified. 3. Moderate stool throughout the colon suggesting constipation. 4. Stable age advanced atherosclerotic calcification involving the aorta and iliac arteries. 5. 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 6. Stable  periumbilical abdominal wall hernia containing fat. Aortic Atherosclerosis (ICD10-I70.0). Electronically Signed   By: Marijo Sanes M.D.   On: 06/27/2022 08:38        Scheduled Meds:  bictegravir-emtricitabine-tenofovir AF  1 tablet Oral QAC breakfast   Chlorhexidine Gluconate Cloth  6 each Topical Q0600   escitalopram  20 mg Oral Daily   feeding supplement  237 mL Oral TID BM   folic acid  1 mg Oral Daily   haloperidol  5 mg Oral Daily   lurasidone  120 mg Oral Q breakfast   midodrine  15 mg Oral TID WC   multivitamin with minerals  1 tablet Oral Daily   mupirocin ointment  1 Application Nasal BID   nystatin   Topical BID   pantoprazole  40 mg Oral QHS   thiamine  100 mg Oral Daily   valACYclovir  1,000 mg Oral Daily   Continuous Infusions:  ceFEPime (MAXIPIME) IV Stopped (06/27/22 1314)   lactated ringers 75 mL/hr at 06/27/22 0859   metronidazole Stopped (06/27/22 0532)   vancomycin Stopped (06/27/22 0226)     LOS: 3 days    Time spent: 48 minutes spent on chart review, discussion with nursing staff, consultants, updating family and interview/physical exam; more than 50% of that time was spent in counseling and/or coordination of care.    Tela Kotecki J [place] DO [facility] Available via Epic secure chat 7am-7pm After these hours, please refer to coverage provider listed on [email or web address] 06/27/2022, 9:48 AM

## 2022-06-28 ENCOUNTER — Inpatient Hospital Stay (HOSPITAL_COMMUNITY): Payer: Medicare HMO

## 2022-06-28 DIAGNOSIS — G9341 Metabolic encephalopathy: Secondary | ICD-10-CM | POA: Diagnosis not present

## 2022-06-28 DIAGNOSIS — D510 Vitamin B12 deficiency anemia due to intrinsic factor deficiency: Secondary | ICD-10-CM | POA: Diagnosis not present

## 2022-06-28 DIAGNOSIS — N179 Acute kidney failure, unspecified: Secondary | ICD-10-CM | POA: Diagnosis not present

## 2022-06-28 DIAGNOSIS — A419 Sepsis, unspecified organism: Secondary | ICD-10-CM | POA: Diagnosis not present

## 2022-06-28 LAB — BPAM RBC
Blood Product Expiration Date: 202310192359
Blood Product Expiration Date: 202310192359
Blood Product Expiration Date: 202310222359
ISSUE DATE / TIME: 202309131017
ISSUE DATE / TIME: 202309170556
ISSUE DATE / TIME: 202309170839
Unit Type and Rh: 5100
Unit Type and Rh: 5100
Unit Type and Rh: 5100

## 2022-06-28 LAB — TYPE AND SCREEN
ABO/RH(D): O POS
Antibody Screen: NEGATIVE
Unit division: 0
Unit division: 0
Unit division: 0

## 2022-06-28 LAB — BASIC METABOLIC PANEL
Anion gap: 4 — ABNORMAL LOW (ref 5–15)
BUN: 12 mg/dL (ref 6–20)
CO2: 25 mmol/L (ref 22–32)
Calcium: 8 mg/dL — ABNORMAL LOW (ref 8.9–10.3)
Chloride: 111 mmol/L (ref 98–111)
Creatinine, Ser: 1.19 mg/dL (ref 0.61–1.24)
GFR, Estimated: >60 mL/min (ref >60)
Glucose, Bld: 95 mg/dL (ref 70–99)
Potassium: 3.8 mmol/L (ref 3.5–5.1)
Sodium: 140 mmol/L (ref 135–145)

## 2022-06-28 LAB — CBC
HCT: 27 % — ABNORMAL LOW (ref 39.0–52.0)
Hemoglobin: 8.5 g/dL — ABNORMAL LOW (ref 13.0–17.0)
MCH: 31 pg (ref 26.0–34.0)
MCHC: 31.5 g/dL (ref 30.0–36.0)
MCV: 98.5 fL (ref 80.0–100.0)
Platelets: 83 10*3/uL — ABNORMAL LOW (ref 150–400)
RBC: 2.74 MIL/uL — ABNORMAL LOW (ref 4.22–5.81)
RDW: 15.7 % — ABNORMAL HIGH (ref 11.5–15.5)
WBC: 19.5 10*3/uL — ABNORMAL HIGH (ref 4.0–10.5)
nRBC: 0.1 % (ref 0.0–0.2)

## 2022-06-28 MED ORDER — AMOXICILLIN-POT CLAVULANATE 875-125 MG PO TABS
1.0000 | ORAL_TABLET | Freq: Two times a day (BID) | ORAL | Status: DC
  Administered 2022-06-28 – 2022-07-08 (×21): 1 via ORAL
  Filled 2022-06-28 (×21): qty 1

## 2022-06-28 MED ORDER — GUAIFENESIN-DM 100-10 MG/5ML PO SYRP
5.0000 mL | ORAL_SOLUTION | ORAL | Status: DC | PRN
  Administered 2022-06-28: 5 mL via ORAL
  Filled 2022-06-28: qty 10

## 2022-06-28 MED ORDER — ALPRAZOLAM 0.5 MG PO TABS
2.0000 mg | ORAL_TABLET | Freq: Three times a day (TID) | ORAL | Status: DC
  Administered 2022-06-28 – 2022-07-08 (×30): 2 mg via ORAL
  Filled 2022-06-28 (×19): qty 4
  Filled 2022-06-28: qty 2
  Filled 2022-06-28 (×10): qty 4

## 2022-06-28 MED ORDER — FUROSEMIDE 10 MG/ML IJ SOLN
40.0000 mg | Freq: Once | INTRAMUSCULAR | Status: AC
  Administered 2022-06-28: 40 mg via INTRAVENOUS
  Filled 2022-06-28: qty 4

## 2022-06-28 MED ORDER — LINEZOLID 600 MG PO TABS
600.0000 mg | ORAL_TABLET | Freq: Two times a day (BID) | ORAL | Status: DC
  Administered 2022-06-28 – 2022-07-08 (×21): 600 mg via ORAL
  Filled 2022-06-28 (×22): qty 1

## 2022-06-28 NOTE — Progress Notes (Signed)
Subjective: Denies pain. No nausea or emesis. Afebrile.  Objective: Vital signs in last 24 hours: Temp:  [97.7 F (36.5 C)-98.2 F (36.8 C)] 97.7 F (36.5 C) (09/19 0000) Pulse Rate:  [84-104] 104 (09/19 0700) Resp:  [13-21] 21 (09/19 0700) BP: (92-124)/(50-85) 107/63 (09/19 0500) SpO2:  [85 %-99 %] 85 % (09/19 0700)  Intake/Output from previous day: 09/18 0701 - 09/19 0700 In: 921.1 [P.O.:120; I.V.:103.9; IV Piggyback:697.2] Out: 1500 [Urine:1500] Intake/Output this shift: Total I/O In: 119.8 [I.V.:19.8; IV Piggyback:100] Out: -  UOP: 1.5L clear yellow  Physical Exam:  General: Alert and oriented CV: RRR Lungs: Clear Abdomen: Soft, ND, NT Chronic hidradenitis over suprapubic region and bilateral groin creases.  No areas of fluctuance, induration, or necrosis over scrotum or perineum. Ext: NT, No erythema  Lab Results: Recent Labs    06/27/22 0254 06/27/22 1310 06/28/22 0307  HGB 7.9* 8.4* 8.5*  HCT 24.6* 26.3* 27.0*   BMET Recent Labs    06/27/22 0254 06/28/22 0307  NA 140 140  K 3.6 3.8  CL 111 111  CO2 25 25  GLUCOSE 108* 95  BUN 12 12  CREATININE 1.29* 1.19  CALCIUM 7.9* 8.0*     Studies/Results: CT ABDOMEN PELVIS WO CONTRAST  Result Date: 06/27/2022 CLINICAL DATA:  Follow-up urinary tract infection. EXAM: CT ABDOMEN AND PELVIS WITHOUT CONTRAST TECHNIQUE: Multidetector CT imaging of the abdomen and pelvis was performed following the standard protocol without IV contrast. RADIATION DOSE REDUCTION: This exam was performed according to the departmental dose-optimization program which includes automated exposure control, adjustment of the mA and/or kV according to patient size and/or use of iterative reconstruction technique. COMPARISON:  CT scan 06/24/2022 FINDINGS: Lower chest: Nodular interstitial pattern in the lung bases with patchy areas of atelectasis. Findings suggest atypical/viral pneumonia or other atypical inflammatory process. No focal  pulmonary infiltrates. No pleural effusions. Hepatobiliary: No hepatic lesions or intrahepatic biliary dilatation. Gallbladder is grossly normal. No common bile duct dilatation. Pancreas: No mass, inflammation or ductal dilatation. Spleen: Normal size.  No focal lesions. Adrenals/Urinary Tract: Adrenal glands are normal. Resolution of gas in the left renal collecting system. No renal calculi or renal lesions. There is a Foley catheter in a decompressed bladder with a small amount of residual gas which is not unexpected. No gas is seen in the bladder wall. No bladder calculi. Stomach/Bowel: The stomach duodenum and small bowel are grossly normal. Moderate stool throughout colon suggesting constipation. The terminal ileum and appendix are normal. Vascular/Lymphatic: Stable age advanced atherosclerotic calcification involving the aorta and iliac arteries but no aneurysm. Stable borderline enlarged upper abdominal and retroperitoneal lymph nodes. Reproductive: The prostate gland and seminal vesicles unremarkable. Other: Stable periumbilical abdominal wall hernia containing fat. Stable borderline enlarged inguinal lymph nodes likely reactive. 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 IMPRESSION: 1. Nodular interstitial pattern in the lung bases with patchy areas of atelectasis. Findings suggest atypical/viral pneumonia or other atypical inflammatory process. 2. Resolution of gas in the left renal collecting system. Interval placement of Foley catheter in the bladder. No gas in the bladder is identified. 3. Moderate stool throughout the colon suggesting constipation. 4. Stable age advanced atherosclerotic calcification involving the aorta and iliac arteries. 5. 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 6. Stable periumbilical abdominal wall hernia containing fat. Aortic  Atherosclerosis (ICD10-I70.0). Electronically Signed   By: Marijo Sanes M.D.   On: 06/27/2022 08:38    Assessment/Plan: Emphysematous cystitis Emphysematous left pyelitis Chronic hidradenitis suppurativa involving the groin creases, pubic region and buttocks Sepsis   -Reviewed CT yesterday with resolution of gas in left renal collecting system and bladder -Ok for void trial per primary team -Ucx no growth. Culture collected after initiation of antibiotics. -Recommend PO abx for 2 weeks -Wounds are stable and consistent with chronic hidradenitis.  Appreciate wound care assistance -Will sign off. Please call with questions.   LOS: 4 days   Matt R. Percy Winterrowd MD 06/28/2022, 7:46 AM [facility]  Pager: [phone]

## 2022-06-28 NOTE — Progress Notes (Signed)
PROGRESS NOTE    [name]  TGY:[id number] DOB: 08/13/1986 DOA: 06/24/2022 PCP: [name], MD    Brief Narrative:   [name] is a 36 y.o. male with past medical history significant for HIV, bipolar 1 disorder, depression, schizoaffective disorder, PTSD, seizure, chronic buttock wounds, hidradenitis suppurativa who presented to [facility] ED on 9/15 via EMS after he was found altered at home by his home health nurse.  Patient apparently lives alone and is nonambulatory but has a home health nurse who assists him during the day.  Due to patient's mental status, history obtained from EMS/ED provider reports as well as mother.  Mother reported that he drinks approximately 4-5 40 ounce beers a day and has been having some increased wounds/skin breakdown to groin as well as nausea/vomiting and diarrhea.  It is reported that he typically sits in his body weight [place] being seen by his home health nurses; and he typically has been drinking alcohol and not taking in anything else.  In the ED, temperature 96.7 F, HR 119, RR 18, BP 85/45, SPO2 81% on room air.  WBC 15.1, hemoglobin 8.5, platelets 76.  Sodium 137, potassium 3.0, chloride 99, CO2 30, glucose 128, BUN 20, creatinine 1.43, magnesium 2.2, AST 13, ALT 15, total bilirubin 0.8.  Lactic acid 2.0.  Procalcitonin 0.60.  INR 1.3.  COVID-19 PCR negative.  Influenza A/B PCR negative.  FOBT positive.  EtOH level less than 10.  MRSA PCR positive.  CT head without contrast with no acute intracranial abnormality.  Chest x-ray with no active cardiopulmonary disease process.  CT abdomen/pelvis with contrast with tiny hiatal hernia, gas noted within the urinary bladder lumen, urinary bladder wall, and left renal finding suggestive of emphysematous cystitis and pyelitis, irregular dermal thickening of the gluteal soft tissues, no organized fluid collection or subcutaneous soft tissue emphysema/edema to suggest Fourniers gangrene.  Blood cultures obtained.   Patient was started on empiric antibiotics.  TRH consulted for further evaluation management of sepsis likely secondary to cellulitis.  Assessment & Plan:   Sepsis, POA Emphysematous cystitis/left pyelitis Cellulitis groin Chronic hidradenitis suppurativa involving groin creases, pubic region, buttocks Patient presenting to the ED via EMS after being found by home health nurse altered while lying in his own excrement.  On arrival to the ED, patient was hypothermic, elevated to [facility] count of 15.1 with procalcitonin 0.60, lactic acid 2.0.  MRSA PCR positive.  CT abdomen/pelvis notable for emphysematous cystitis/pyelitis and irregular dermal thickening of the gluteal soft tissues consistent with cellulitis.  Urology was consulted, who recommended broad-spectrum antibiotics, Foley catheter placement to allow for maximal decompression with no plans for left ureteral stent placement given he has no hydronephrosis and this is likely an ascending infection.  Repeat CT abdomen/pelvis 9/18 shows resolution of gas to left renal collecting system, no gas noted within bladder. -- WBC 15.1>14.3>12.7>14.3>17.5>19.5 -- Lactic acid 2.0>0.6 -- Blood cultures x2: No growth x 3 days -- Urine culture: No growth -- De-escalate vancomycin/cefepime/metronidazole to Zyvox/Augmentin, to complete 2 week course per urology -- Supportive care -- dc foley with voiding trial today -- CBC daily  Acute hypoxic respiratory failure, not POA Patient overnight developed oxygen requirement, up to 5 L nasal cannula.  Lung sounds rhonchorous with chest x-ray with mild congestion versus infiltrates.  Currently on empiric antibiotics as above.  Will give dose of IV Lasix. -- Repeat chest x-ray tomorrow morning -- Titrate supplemental oxygen to maintain SPO2 greater than 92%  Anemia of chronic medical  disease Hemoglobin 7.2 this morning.  Anemia panel with iron 50, TIBC low at 106, ferritin high at 386, folate 11.2, vitamin B12 452; all  consistent with anemia of chronic disease. -- Hgb 8.5>7.2>6.1>8.3>7.9>8.4>8.5 -- Transfuse 2 unit PRBCs, repeat H&H following transfusion -- CBC daily  Bipolar 1 disorder Schizoaffective disorder PTSD Depression/anxiety -- Lurasidone 120 mg p.o. daily -- Haldol 5 mg p.o. daily -- Lexapro 20 mg p.o. daily -- Hold home Xanax for now   Orthostatic hypotension/autonomic dysfunction -- Increased Midodrine to 15 mg p.o. 3 times daily AC  Hx HIV Follows with infectious disease outpatient, [facility].  Per review of EMR, notes state he is maintained on Sombrillo; but although not listed on home medication list -- Continue Biktarvy -- Outpatient follow-up with infectious disease  EtOH use disorder EtOH level less than 10 on admission.  Counseled on need for complete cessation  Chronic buttock wounds Pressure Injury 02/13/22 Buttocks Bilateral (Active)  02/13/22 2030  Location: Buttocks  Location Orientation: Bilateral  Staging:   Wound Description (Comments):   Present on Admission: Yes  --Wound RN consulted --soap and water cleanse, rinse and gentle pat dry for all affected areas (including axillae) followed by placement of silver hydrofiber (Aquacel Ag+ Advantage) topped with silicone foam to the buttock and thigh lesions. Moisture barrier cream is to be applied to the surrounding macerated areas.    DVT prophylaxis: SCDs Start: 06/24/22 2131    Code Status: DNR Family Communication: No family present at bedside this morning  Disposition Plan:  Level of care: Progressive Status is: Inpatient Remains inpatient appropriate because: IV antibiotics    Consultants:  Urology, Dr. Abner Greenspan  Procedures:  None  Antimicrobials:  Vancomycin 9/15>> Cefepime 9/15>> Metronidazole 9/15>>   Subjective: Patient seen examined bedside, resting comfortably.  Lying in bed.  No specific complaints this morning.  Overnight now requiring up to 5 L nasal cannula for shortness  of breath.  Seen by urology okay for voiding trial.  Urology now signing off and recommend 2 weeks of empiric antibiotics.  Patient with other questions or concerns at this time.  Denies headache, no fever/chills/night sweats, no nausea/vomiting/diarrhea, no dizziness, no chest pain, no palpitations, no abdominal pain.  No other acute events overnight per nursing staff.  Objective: Vitals:   06/28/22 0751 06/28/22 0800 06/28/22 0821 06/28/22 0900  BP:    100/64  Pulse:  (!) 108 (!) 109 (!) 107  Resp:  (!) 22 (!) 25 19  Temp: 98.2 F (36.8 C)     TempSrc: Oral     SpO2:  95% 97% 97%  Weight:      Height:        Intake/Output Summary (Last 24 hours) at 06/28/2022 0940 Last data filed at 06/28/2022 0900 Gross per 24 hour  Intake 1056.06 ml  Output 1175 ml  Net -118.94 ml   Filed Weights   06/24/22 2200  Weight: 81.1 kg    Examination:  Physical Exam: GEN: NAD, alert and oriented x 3, chronically ill in appearance, appears older than stated age HEENT: NCAT, PERRL, EOMI, sclera clear, dry mucous membranes, poor dentition PULM: Coarse breath sounds bilaterally, slightly diminished bases with crackles, no wheezing, normal [place]paire effort without accessory muscle use, on 5 L nasal cannula with SPO2 94% at rest CV: RRR w/o M/G/R GI: abd soft, NTND, NABS, no R/G/M GU: Foley catheter noted draining clear yellow urine MSK: no peripheral edema, moves all extremities independently NEURO: CN II-XII intact, no focal  deficits, sensation to light touch intact PSYCH: normal mood/affect Integumentary: Thickened skin with evidence of chronic hidradenitis over his suprapubic, groin, buttock region with several areas of small open wounds with no significant drainage of purulent material.    Data Reviewed: I have personally reviewed following labs and imaging studies  CBC: Recent Labs  Lab 06/24/22 1447 06/24/22 2154 06/25/22 0257 06/26/22 0245 06/26/22 1157 06/27/22 0254 06/27/22 1310  06/28/22 0307  WBC 15.1*  --  12.4* 12.7* 14.3* 17.5*  --  19.5*  NEUTROABS 11.1*  --   --   --  10.8*  --   --   --   HGB 8.5*   < > 7.2* 6.1* 8.3* 7.9* 8.4* 8.5*  HCT 26.2*   < > 22.1* 19.7* 26.3* 24.6* 26.3* 27.0*  MCV 96.3  --  95.3 100.0 97.8 97.6  --  98.5  PLT 76*  --  66* 59* 60* 67*  --  83*   < > = values in this interval not displayed.   Basic Metabolic Panel: Recent Labs  Lab 06/24/22 1447 06/25/22 0257 06/26/22 0245 06/27/22 0254 06/28/22 0307  NA 137 136 142 140 140  K 3.0* 3.3* 3.7 3.6 3.8  CL 99 102 112* 111 111  CO2 30 27 26 25 25   GLUCOSE 128* 92 84 108* 95  BUN 20 14 13 12 12   CREATININE 1.43* 1.08 1.12 1.29* 1.19  CALCIUM 8.3* 7.7* 7.8* 7.9* 8.0*  MG 2.2  --  2.1  --   --    GFR: Estimated Creatinine Clearance: 96.5 mL/min (by C-G formula based on SCr of 1.19 mg/dL). Liver Function Tests: Recent Labs  Lab 06/24/22 1447 06/25/22 0257 06/26/22 0245  AST 13* 13* 11*  ALT 15 12 12   ALKPHOS 123 91 84  BILITOT 0.8 0.8 1.0  PROT 7.7 6.1* 5.8*  ALBUMIN 2.2* 1.7* 1.6*   No results for input(s): "LIPASE", "AMYLASE" in the last 168 hours. No results for input(s): "AMMONIA" in the last 168 hours. Coagulation Profile: Recent Labs  Lab 06/24/22 1447 06/25/22 0257  INR 1.3* 1.3*   Cardiac Enzymes: No results for input(s): "CKTOTAL", "CKMB", "CKMBINDEX", "TROPONINI" in the last 168 hours. BNP (last 3 results) No results for input(s): "PROBNP" in the last 8760 hours. HbA1C: No results for input(s): "HGBA1C" in the last 72 hours. CBG: Recent Labs  Lab 06/24/22 1447  GLUCAP 96   Lipid Profile: No results for input(s): "CHOL", "HDL", "LDLCALC", "TRIG", "CHOLHDL", "LDLDIRECT" in the last 72 hours. Thyroid Function Tests: No results for input(s): "TSH", "T4TOTAL", "FREET4", "T3FREE", "THYROIDAB" in the last 72 hours. Anemia Panel: No results for input(s): "VITAMINB12", "FOLATE", "FERRITIN", "TIBC", "IRON", "RETICCTPCT" in the last 72 hours.  Sepsis  Labs: Recent Labs  Lab 06/24/22 1447 06/25/22 0257 06/26/22 0245  PROCALCITON  --  0.60  --   LATICACIDVEN 2.0*  --  0.6    Recent Results (from the past 240 hour(s))  Blood Culture (routine x 2)     Status: None (Preliminary result)   Collection Time: 06/24/22  2:52 PM   Specimen: BLOOD  Result Value Ref Range Status   Specimen Description   Final    BLOOD SITE NOT SPECIFIED Performed at [facility] [address], [place], [place] [address]    Special Requests   Final    BOTTLES DRAWN AEROBIC AND ANAEROBIC Blood Culture adequate volume Performed at [facility] [address], [place], [place] [address]  Culture   Final    NO GROWTH 4 DAYS Performed at [facility], [place] [address], [place], [place] [address]    Report Status PENDING  Incomplete  Resp Panel by RT-PCR (Flu A&B, Covid) Anterior Nasal Swab     Status: None   Collection Time: 06/24/22  2:58 PM   Specimen: Anterior Nasal Swab  Result Value Ref Range Status   SARS Coronavirus 2 by RT PCR NEGATIVE NEGATIVE Final    Comment: (NOTE) SARS-CoV-2 target nucleic acids are NOT DETECTED.  The SARS-CoV-2 RNA is generally detectable in upper respiratory specimens during the acute phase of infection. The lowest concentration of SARS-CoV-2 viral copies this assay can detect is 138 copies/mL. A negative result does not preclude SARS-Cov-2 infection and should not be used as the sole basis for treatment or other patient management decisions. A negative result may occur with  improper specimen collection/handling, submission of specimen other than nasopharyngeal swab, presence of viral mutation(s) within the areas targeted by this assay, and inadequate number of viral copies(<138 copies/mL). A negative result must be combined with clinical observations, patient history, and epidemiological information. The expected result is Negative.  Fact Sheet for Patients:   EntrepreneurPulse.com.au  Fact Sheet for Healthcare Providers:  IncredibleEmployment.be  This test is no t yet approved or cleared by the [place] FDA and  has been authorized for detection and/or diagnosis of SARS-CoV-2 by FDA under an Emergency Use Authorization (EUA). This EUA will remain  in effect (meaning this test can be used) for the duration of the COVID-19 declaration under Section 564(b)(1) of the Act, 21 U.S.C.section 360bbb-3(b)(1), unless the authorization is terminated  or revoked sooner.       Influenza A by PCR NEGATIVE NEGATIVE Final   Influenza B by PCR NEGATIVE NEGATIVE Final    Comment: (NOTE) The Xpert Xpress SARS-CoV-2/FLU/RSV plus assay is intended as an aid in the diagnosis of influenza from Nasopharyngeal swab specimens and should not be used as a sole basis for treatment. Nasal washings and aspirates are unacceptable for Xpert Xpress SARS-CoV-2/FLU/RSV testing.  Fact Sheet for Patients: EntrepreneurPulse.com.au  Fact Sheet for Healthcare Providers: IncredibleEmployment.be  This test is not yet approved or cleared by the [place] FDA and has been authorized for detection and/or diagnosis of SARS-CoV-2 by FDA under an Emergency Use Authorization (EUA). This EUA will remain in effect (meaning this test can be used) for the duration of the COVID-19 declaration under Section 564(b)(1) of the Act, 21 U.S.C. section 360bbb-3(b)(1), unless the authorization is terminated or revoked.  Performed at [facility], [place] [address], [place], [place] [address]   MRSA Next Gen by PCR, Nasal     Status: Abnormal   Collection Time: 06/24/22 10:00 PM   Specimen: Nasal Mucosa; Nasal Swab  Result Value Ref Range Status   MRSA by PCR Next Gen DETECTED (A) NOT DETECTED Final    Comment: CRITICAL RESULT CALLED TO, READ BACK BY AND VERIFIED WITH: Dorise Bullion,  RN (NOTE) The GeneXpert MRSA Assay (FDA approved for NASAL specimens only), is one component of a comprehensive MRSA colonization surveillance program. It is not intended to diagnose MRSA infection nor to guide or monitor treatment for MRSA infections. Test performance is not FDA approved in patients less than 55 years old. Performed at [facility], [place] [address] [place], [place] [address]   Blood Culture (routine x 2)     Status: None (Preliminary result)   Collection Time:  06/24/22 10:02 PM   Specimen: BLOOD LEFT HAND  Result Value Ref Range Status   Specimen Description   Final    BLOOD LEFT HAND Performed at [facility] [address], [place], [place] [address]    Special Requests   Final    BOTTLES DRAWN AEROBIC ONLY Blood Culture results may not be optimal due to an inadequate volume of blood received in culture bottles Performed at [facility] [address], [place], [place] [address]    Culture   Final    NO GROWTH 3 DAYS Performed at [facility], [place] [address] [place], [place] [address]    Report Status PENDING  Incomplete  Urine Culture     Status: None   Collection Time: 06/26/22  5:29 AM   Specimen: Urine, Catheterized  Result Value Ref Range Status   Specimen Description   Final    URINE, CATHETERIZED Performed at [facility] [address], [place], [place] [address]    Special Requests   Final    NONE Performed at [facility], [place] [address], [place], [place] [address]    Culture   Final    NO GROWTH Performed at [facility], [place] [address], [place],  [address]    Report Status 06/27/2022 FINAL  Final         Radiology Studies: DG CHEST PORT 1 VIEW  Result Date: 06/28/2022 CLINICAL DATA:  Provided history: Increased congestion, shortness of breath. EXAM: PORTABLE CHEST 1 VIEW COMPARISON:  Prior chest radiographs  06/24/2022 and earlier. FINDINGS: Heart size within normal limits. Opacity within the medial right lung base, new from the prior examination of 06/24/2022 and suspicious for pneumonia. Mild ill-defined opacity within the left lung base, also new and with an appearance most suggestive of atelectasis. No evidence of pleural effusion or pneumothorax. No acute bony abnormality identified. IMPRESSION: Opacity in the medial right lung base, new from the prior chest radiograph of 06/24/2022 and suspicious for pneumonia. Mild ill-defined opacity within the left lung base, also new and with an appearance most suggestive of atelectasis. Electronically Signed   By: Kellie Simmering D.O.   On: 06/28/2022 08:44   CT ABDOMEN PELVIS WO CONTRAST  Result Date: 06/27/2022 CLINICAL DATA:  Follow-up urinary tract infection. EXAM: CT ABDOMEN AND PELVIS WITHOUT CONTRAST TECHNIQUE: Multidetector CT imaging of the abdomen and pelvis was performed following the standard protocol without IV contrast. RADIATION DOSE REDUCTION: This exam was performed according to the departmental dose-optimization program which includes automated exposure control, adjustment of the mA and/or kV according to patient size and/or use of iterative reconstruction technique. COMPARISON:  CT scan 06/24/2022 FINDINGS: Lower chest: Nodular interstitial pattern in the lung bases with patchy areas of atelectasis. Findings suggest atypical/viral pneumonia or other atypical inflammatory process. No focal pulmonary infiltrates. No pleural effusions. Hepatobiliary: No hepatic lesions or intrahepatic biliary dilatation. Gallbladder is grossly normal. No common bile duct dilatation. Pancreas: No mass, inflammation or ductal dilatation. Spleen: Normal size.  No focal lesions. Adrenals/Urinary Tract: Adrenal glands are normal. Resolution of gas in the left renal collecting system. No renal calculi or renal lesions. There is a Foley catheter in a decompressed bladder with a  small amount of residual gas which is not unexpected. No gas is seen in the bladder wall. No bladder calculi. Stomach/Bowel: The stomach duodenum and small bowel are grossly normal. Moderate stool throughout colon suggesting constipation. The terminal ileum and  appendix are normal. Vascular/Lymphatic: Stable age advanced atherosclerotic calcification involving the aorta and iliac arteries but no aneurysm. Stable borderline enlarged upper abdominal and retroperitoneal lymph nodes. Reproductive: The prostate gland and seminal vesicles unremarkable. Other: Stable periumbilical abdominal wall hernia containing fat. Stable borderline enlarged inguinal lymph nodes likely reactive. 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 IMPRESSION: 1. Nodular interstitial pattern in the lung bases with patchy areas of atelectasis. Findings suggest atypical/viral pneumonia or other atypical inflammatory process. 2. Resolution of gas in the left renal collecting system. Interval placement of Foley catheter in the bladder. No gas in the bladder is identified. 3. Moderate stool throughout the colon suggesting constipation. 4. Stable age advanced atherosclerotic calcification involving the aorta and iliac arteries. 5. 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 6. Stable periumbilical abdominal wall hernia containing fat. Aortic Atherosclerosis (ICD10-I70.0). Electronically Signed   By: Marijo Sanes M.D.   On: 06/27/2022 08:38        Scheduled Meds:  bictegravir-emtricitabine-tenofovir AF  1 tablet Oral QAC breakfast   Chlorhexidine Gluconate Cloth  6 each Topical Q0600   escitalopram  20 mg Oral Daily   feeding supplement  237 mL Oral TID BM   folic acid  1  mg Oral Daily   haloperidol  5 mg Oral Daily   lurasidone  120 mg Oral Q breakfast   midodrine  15 mg Oral TID WC   multivitamin with minerals  1 tablet Oral Daily   mupirocin ointment  1 Application Nasal BID   nystatin   Topical BID   pantoprazole  40 mg Oral QHS   thiamine  100 mg Oral Daily   valACYclovir  1,000 mg Oral Daily   Continuous Infusions:  sodium chloride 10 mL/hr at 06/28/22 0900   ceFEPime (MAXIPIME) IV Stopped (06/28/22 1610)   lactated ringers 75 mL/hr at 06/27/22 2151   metronidazole Stopped (06/28/22 0534)   vancomycin Stopped (06/28/22 0139)     LOS: 4 days    Time spent: 48 minutes spent on chart review, discussion with nursing staff, consultants, updating family and interview/physical exam; more than 50% of that time was spent in counseling and/or coordination of care.    Kaymarie Wynn J [place] DO [facility] Available via Epic secure chat 7am-7pm After these hours, please refer to coverage provider listed on [email or web address] 06/28/2022, 9:40 AM

## 2022-06-28 NOTE — Evaluation (Signed)
Physical Therapy Evaluation Patient Details Name: [name] MRN: [id number] DOB: 1986/06/10 Today's Date: 06/28/2022  History of Present Illness  Patient is a 36 year old male who presented to the hosptial adter after being found at home by home aid with AMS. patient was admitted with sepsis, emdphysematous cystitis, cellulitis of groin, chronic hidragenitis suppeurative involving groin creases, acute hypoxic respiratory failure, anemia of chronic medical disease. PMH: HIV, bipolar disorder, depression, PTSD, schizoaffective disorder.   Clinical Impression  Pt presents with the problems listed above and functional impairments below. Pt is known to this PT from a prior admission in 02/2022. Pt in good spirits and agreeable to be seen; reports at baseline he lives at home alone and transfers from bed to [facility] with aide assistance; aide present 7days/week for 3hours/day. Pt required min assist for bed mobility, min assist +2 for transfers, min assist for static standing secondary to posterior lean during pericare as pt was incontinent of bowel (pt stood for 3+min). Pt did have soft BP (see general comments below) but denied orthostatic symptoms; removed O2NC and pt remained in good SpO2 93-97%, RN aware. Recommending SNF-level therapies upon discharge vs [facility] services if pt can acquire 24/7 assistance/supervision. We will continue to follow acutely.         Recommendations for follow up therapy are one component of a multi-disciplinary discharge planning process, led by the attending physician.  Recommendations may be updated based on patient status, additional functional criteria and insurance authorization.  Follow Up Recommendations Skilled nursing-short term rehab (<3 hours/day) (SNFvsHHPT dependent on 24/7 supervision/assistance at home) Can patient physically be transported by private vehicle: Yes    Assistance Recommended at Discharge Frequent or constant Supervision/Assistance  Patient can  return home with the following  A lot of help with walking and/or transfers;A lot of help with bathing/dressing/bathroom;Assistance with cooking/housework;Assistance with feeding;Direct supervision/assist for medications management;Direct supervision/assist for financial management;Assist for transportation;Help with stairs or ramp for entrance    Equipment Recommendations None recommended by PT (TBD)  Recommendations for Other Services       Functional Status Assessment Patient has had a recent decline in their functional status and demonstrates the ability to make significant improvements in function in a reasonable and predictable amount of time.     Precautions / Restrictions Precautions Precautions: Fall Restrictions Weight Bearing Restrictions: No      Mobility  Bed Mobility Overal bed mobility: Needs Assistance Bed Mobility: Supine to Sit     Supine to sit: Min assist     General bed mobility comments: Pt required increased time as well as min assist to bring hips EOB via bed pad, otherwise min guard with short seated rest break midway through bed mobility, pt reporting mild back pain.    Transfers Overall transfer level: Needs assistance Equipment used: Rolling walker (2 wheels) Transfers: Sit to/from Stand, Bed to chair/wheelchair/BSC Sit to Stand: +2 safety/equipment, Min assist   Step pivot transfers: Min assist, +2 safety/equipment       General transfer comment: . Pt required min assist for steadying during Sit to Stand transfer, pt reliant on BLE support against bed. Upon standing, pt noted to be soiled, pt stood for 3+min for pericare with min assist for steadying to correct posterior lean. Step pivot: min assist for steadying, VCs for proximity to device. Pt seated in recliner upon exit.    Ambulation/Gait               General Gait Details: deferred  Stairs            Wheelchair Mobility    Modified Rankin (Stroke Patients Only)        Balance Overall balance assessment: Needs assistance Sitting-balance support: Feet supported, No upper extremity supported Sitting balance-Leahy Scale: Good   Postural control: Posterior lean Standing balance support: Reliant on assistive device for balance, During functional activity, Bilateral upper extremity supported Standing balance-Leahy Scale: Poor                               Pertinent Vitals/Pain Pain Assessment Pain Assessment: Faces Faces Pain Scale: Hurts a little bit Pain Location: back with movement to EOB Pain Descriptors / Indicators: Grimacing Pain Intervention(s): Limited activity within patient's tolerance, Monitored during session, Repositioned    Home Living Family/patient expects to be discharged to:: Unsure Living Arrangements: Alone                 Additional Comments: Pt reports having a lift recliner and WC    Prior Function Prior Level of Function : Needs assist  Cognitive Assist : ADLs (cognitive)   ADLs (Cognitive): Set up cues Physical Assist : Mobility (physical);ADLs (physical) Mobility (physical): Bed mobility;Transfers;Gait ADLs (physical): Bathing;Dressing;Toileting;IADLs Mobility Comments: uses RW for ambulation, patient reported using w/c for mobility in the house ADLs Comments: pt has aide 7 days/week for 3 hours; aide assists with cleaning, cooking, laundry, bathing and dressing     Hand Dominance   Dominant Hand: Right    Extremity/Trunk Assessment   Upper Extremity Assessment Upper Extremity Assessment: Defer to OT evaluation    Lower Extremity Assessment Lower Extremity Assessment: Generalized weakness    Cervical / Trunk Assessment Cervical / Trunk Assessment: Kyphotic  Communication   Communication: Expressive difficulties (low volume)  Cognition Arousal/Alertness: Awake/alert Behavior During Therapy: Flat affect Overall Cognitive Status: Within Functional Limits for tasks assessed                                  General Comments: patient was plesant and cooperative during session. noted to be very soft spoken        General Comments General comments (skin integrity, edema, etc.): patients BP at start was 103/65 mmhg. sitting EOB patients BP was 98/64 mmhg with no dizziness reported. patient was noted to have HR 127 bpm during activity. patien as able to maitnain O2 on RA 93% or better with tasks. nurse made aware.    Exercises     Assessment/Plan    PT Assessment Patient needs continued PT services  PT Problem List Decreased strength;Decreased range of motion;Decreased activity tolerance;Decreased balance;Decreased mobility;Decreased coordination;Pain       PT Treatment Interventions DME instruction;Gait training;Stair training;Functional mobility training;Therapeutic activities;Therapeutic exercise;Balance training;Neuromuscular re-education;Patient/family education    PT Goals (Current goals can be found in the Care Plan section)  Acute Rehab PT Goals Patient Stated Goal: none stated PT Goal Formulation: With patient Time For Goal Achievement: 07/12/22 Potential to Achieve Goals: Good    Frequency Min 3X/week     Co-evaluation PT/OT/SLP Co-Evaluation/Treatment: Yes Reason for Co-Treatment: For patient/therapist safety;To address functional/ADL transfers PT goals addressed during session: Mobility/safety with mobility OT goals addressed during session: ADL's and self-care       AM-PAC PT "6 Clicks" Mobility  Outcome Measure Help needed turning from your back to your side while in a flat bed without using  bedrails?: A Little Help needed moving from lying on your back to sitting on the side of a flat bed without using bedrails?: A Little Help needed moving to and from a bed to a chair (including a wheelchair)?: A Little Help needed standing up from a chair using your arms (e.g., wheelchair or bedside chair)?: A Little Help needed to walk in  hospital room?: A Little Help needed climbing 3-5 steps with a railing? : A Little 6 Click Score: 18    End of Session Equipment Utilized During Treatment: Gait belt Activity Tolerance: Patient limited by fatigue Patient left: in chair;with call bell/phone within reach;with chair alarm set Nurse Communication: Mobility status PT Visit Diagnosis: Muscle weakness (generalized) (M62.81)    Time: 0950-1020 PT Time Calculation (min) (ACUTE ONLY): 30 min   Charges:   PT Evaluation $PT Eval Moderate Complexity: 1 Mod         Coolidge Breeze, PT, DPT [facility] Rehabilitation Department Office: [phone]  Coolidge Breeze 06/28/2022, 2:21 PM

## 2022-06-28 NOTE — Evaluation (Signed)
Occupational Therapy Evaluation Patient Details Name: [name] MRN: [id number] DOB: 25-Oct-1985 Today's Date: 06/28/2022   History of Present Illness Patient is a 36 year old male who presented to the hosptial adter after being found at home by home aid with AMS. patient was admitted with sepsis, emdphysematous cystitis, cellulitis of groin, chronic hidragenitis suppeurative involving groin creases, acute hypoxic respiratory failure, anemia of chronic medical disease. PMH: HIV, bipolar disorder, depression, PTSD, schizoaffective disorder.   Clinical Impression   Patient is a 36 year old male who was admitted for above. Patient reported living at home at w/c level with ability to complete transfers with caregivers 3 hours a day. Patient was noted to be min A for bed mobility with min A +2 for safety with transfers and line management. Patient was TD for hygiene tasks in standing on this date with posterior leaning noted. Patient was noted to have decreased functional activity tolerance, decreased endurance, decreased standing balance, decreased safety awareness, and decreased knowledge of AD/AE impacting participation in ADLs.  Patient would continue to benefit from skilled OT services at this time while admitted and after d/c to address noted deficits in order to improve overall safety and independence in ADLs.       Recommendations for follow up therapy are one component of a multi-disciplinary discharge planning process, led by the attending physician.  Recommendations may be updated based on patient status, additional functional criteria and insurance authorization.   Follow Up Recommendations  Skilled nursing-short term rehab (<3 hours/day) (pending level of support available at time of d/c.)    Assistance Recommended at Discharge    Patient can return home with the following A lot of help with walking and/or transfers;A lot of help with bathing/dressing/bathroom;Direct  supervision/assist for medications management;Direct supervision/assist for financial management;Assist for transportation;Assistance with cooking/housework;Help with stairs or ramp for entrance    Functional Status Assessment  Patient has had a recent decline in their functional status and demonstrates the ability to make significant improvements in function in a reasonable and predictable amount of time.  Equipment Recommendations  Other (comment) (defer to next venue)    Recommendations for Other Services       Precautions / Restrictions Restrictions Weight Bearing Restrictions: No      Mobility Bed Mobility Overal bed mobility: Needs Assistance Bed Mobility: Supine to Sit     Supine to sit: Min assist     General bed mobility comments: with increased time.    Transfers Overall transfer level: Needs assistance Equipment used: Rolling walker (2 wheels) Transfers: Sit to/from Stand Sit to Stand: Min guard, +2 safety/equipment                  Balance Overall balance assessment: Mild deficits observed, not formally tested                                         ADL either performed or assessed with clinical judgement   ADL Overall ADL's : Needs assistance/impaired Eating/Feeding: Set up;Sitting   Grooming: Sitting;Set up   Upper Body Bathing: Min guard;Sitting   Lower Body Bathing: Maximal assistance;Bed level   Upper Body Dressing : Min guard;Sitting   Lower Body Dressing: Maximal assistance;Bed level   Toilet Transfer: +2 for safety/equipment;Minimal assistance Toilet Transfer Details (indicate cue type and reason): with cues for line management. Toileting- Clothing Manipulation and Hygiene: Maximal assistance;Sit to/from  stand               Vision Patient Visual Report: No change from baseline       Perception     Praxis      Pertinent Vitals/Pain Pain Assessment Pain Assessment: Faces Faces Pain Scale: Hurts a  little bit Pain Location: back with movement to EOB Pain Descriptors / Indicators: Grimacing Pain Intervention(s): Monitored during session     Hand Dominance Right   Extremity/Trunk Assessment Upper Extremity Assessment Upper Extremity Assessment: Overall WFL for tasks assessed   Lower Extremity Assessment Lower Extremity Assessment: Defer to PT evaluation   Cervical / Trunk Assessment Cervical / Trunk Assessment: Kyphotic   Communication Communication Communication: Expressive difficulties (low volume)   Cognition Arousal/Alertness: Awake/alert Behavior During Therapy: Flat affect Overall Cognitive Status: Within Functional Limits for tasks assessed                                 General Comments: patient was plesant and cooperative during session. noted to be very soft spoken     General Comments  patients BP at start was 103/65 mmhg. sitting EOB patients BP was 98/64 mmhg with no dizziness reported. patient was noted to have HR 127 bpm during activity. patien as able to maitnain O2 on RA 93% or better with tasks. nurse made aware.    Exercises     Shoulder Instructions      Home Living   Living Arrangements: Alone                               Additional Comments: Pt reports having a lift recliner.      Prior Functioning/Environment Prior Level of Function : Needs assist  Cognitive Assist : ADLs (cognitive)   ADLs (Cognitive): Set up cues Physical Assist : Mobility (physical);ADLs (physical)   ADLs (physical): Bathing;Dressing;Toileting;IADLs Mobility Comments: uses RW for ambulation, patient reported using w/c for mobility in the house ADLs Comments: pt has aide 7 days/week for 3 hours; aide assists with cleaning, cooking, laundry, bathing and dressing        OT Problem List: Impaired balance (sitting and/or standing);Decreased activity tolerance;Decreased safety awareness;Decreased knowledge of use of DME or AE;Decreased  knowledge of precautions;Cardiopulmonary status limiting activity;Pain      OT Treatment/Interventions: Self-care/ADL training;Therapeutic exercise;Neuromuscular education;DME and/or AE instruction;Energy conservation;Balance training;Patient/family education;Therapeutic activities    OT Goals(Current goals can be found in the care plan section) Acute Rehab OT Goals Patient Stated Goal: to get to recliner OT Goal Formulation: With patient Time For Goal Achievement: 07/12/22 Potential to Achieve Goals: Fair  OT Frequency: Min 2X/week    Co-evaluation PT/OT/SLP Co-Evaluation/Treatment: Yes Reason for Co-Treatment: For patient/therapist safety;To address functional/ADL transfers PT goals addressed during session: Mobility/safety with mobility OT goals addressed during session: ADL's and self-care      AM-PAC OT "6 Clicks" Daily Activity     Outcome Measure Help from another person eating meals?: A Little Help from another person taking care of personal grooming?: A Little Help from another person toileting, which includes using toliet, bedpan, or urinal?: A Lot Help from another person bathing (including washing, rinsing, drying)?: A Lot Help from another person to put on and taking off regular upper body clothing?: A Little Help from another person to put on and taking off regular lower body clothing?: A Lot 6 Click Score: 15  End of Session Equipment Utilized During Treatment: Rolling walker (2 wheels);Gait belt Nurse Communication: Mobility status  Activity Tolerance: Patient tolerated treatment well Patient left: in chair;with call bell/phone within reach;with chair alarm set  OT Visit Diagnosis: Unsteadiness on feet (R26.81);Other abnormalities of gait and mobility (R26.89);Muscle weakness (generalized) (M62.81);Pain                Time: 0721-8288 OT Time Calculation (min): 29 min Charges:  OT General Charges $OT Visit: 1 Visit OT Evaluation $OT Eval Moderate Complexity:  1 Mod  [name] OTR/L, MS Acute Rehabilitation Department Office# [phone]   [name] 06/28/2022, 11:25 AM

## 2022-06-29 ENCOUNTER — Inpatient Hospital Stay (HOSPITAL_COMMUNITY): Payer: Medicare HMO

## 2022-06-29 DIAGNOSIS — N3080 Other cystitis without hematuria: Secondary | ICD-10-CM | POA: Diagnosis not present

## 2022-06-29 DIAGNOSIS — F1090 Alcohol use, unspecified, uncomplicated: Secondary | ICD-10-CM | POA: Diagnosis not present

## 2022-06-29 DIAGNOSIS — L03314 Cellulitis of groin: Secondary | ICD-10-CM | POA: Diagnosis not present

## 2022-06-29 DIAGNOSIS — A419 Sepsis, unspecified organism: Secondary | ICD-10-CM | POA: Diagnosis not present

## 2022-06-29 LAB — CBC
HCT: 28.5 % — ABNORMAL LOW (ref 39.0–52.0)
Hemoglobin: 9.1 g/dL — ABNORMAL LOW (ref 13.0–17.0)
MCH: 31.4 pg (ref 26.0–34.0)
MCHC: 31.9 g/dL (ref 30.0–36.0)
MCV: 98.3 fL (ref 80.0–100.0)
Platelets: 120 10*3/uL — ABNORMAL LOW (ref 150–400)
RBC: 2.9 MIL/uL — ABNORMAL LOW (ref 4.22–5.81)
RDW: 15.7 % — ABNORMAL HIGH (ref 11.5–15.5)
WBC: 20 10*3/uL — ABNORMAL HIGH (ref 4.0–10.5)
nRBC: 0 % (ref 0.0–0.2)

## 2022-06-29 LAB — BASIC METABOLIC PANEL
Anion gap: 6 (ref 5–15)
BUN: 14 mg/dL (ref 6–20)
CO2: 27 mmol/L (ref 22–32)
Calcium: 8.2 mg/dL — ABNORMAL LOW (ref 8.9–10.3)
Chloride: 109 mmol/L (ref 98–111)
Creatinine, Ser: 1 mg/dL (ref 0.61–1.24)
GFR, Estimated: >60 mL/min (ref >60)
Glucose, Bld: 100 mg/dL — ABNORMAL HIGH (ref 70–99)
Potassium: 3.5 mmol/L (ref 3.5–5.1)
Sodium: 142 mmol/L (ref 135–145)

## 2022-06-29 LAB — CULTURE, BLOOD (ROUTINE X 2)
Culture: NO GROWTH
Special Requests: ADEQUATE

## 2022-06-29 NOTE — Progress Notes (Signed)
Occupational Therapy Treatment Patient Details Name: [name] MRN: [id number] DOB: 05-15-1986 Today's Date: 06/29/2022   History of present illness Patient is a 36 year old male who presented to the hosptial adter after being found at home by home aid with AMS. patient was admitted with sepsis, emdphysematous cystitis, cellulitis of groin, chronic hidragenitis suppeurative involving groin creases, acute hypoxic respiratory failure, anemia of chronic medical disease. PMH: HIV, bipolar disorder, depression, PTSD, schizoaffective disorder.   OT comments  Patient was motivated to participate in the session, although he reported having increased buttocks pain. Functional transfer training was implemented, as such is warranted to prep him for progressive out of bed ADL participation. He performed multiple sit to stand transfers with min assist from an elevated bed surface using a rolling walker, and was able to stand-step to the bedside chair using a RW. He was noted to demo occasional unsteadiness in standing and general deconditioning. Continue OT plan of care to facilitate progressive ADL performance.    Recommendations for follow up therapy are one component of a multi-disciplinary discharge planning process, led by the attending physician.  Recommendations may be updated based on patient status, additional functional criteria and insurance authorization.    Follow Up Recommendations  Skilled nursing-short term rehab (<3 hours/day)       Patient can return home with the following  A lot of help with walking and/or transfers;A lot of help with bathing/dressing/bathroom;Assist for transportation;Assistance with cooking/housework;Help with stairs or ramp for entrance;A little help with walking and/or transfers         Precautions / Restrictions Precautions Precautions: Fall Precaution Comments: Contact precautions Restrictions Weight Bearing Restrictions: No       Mobility Bed  Mobility Overal bed mobility: Needs Assistance Bed Mobility: Supine to Sit     Supine to sit: Min guard     General bed mobility comments: Pt required slightly increased time with use of bed rail, in order to achieve    Transfers Overall transfer level: Needs assistance Equipment used: Rolling walker (2 wheels) Transfers: Sit to/from Stand, Bed to chair/wheelchair/BSC Sit to Stand: Min assist; 4 total stands performed for increased repetition      Step pivot transfers: Min assist, From elevated surface; RW use      General transfer comment: Pt required use of a RW for support, with verbal cues for RW placement & occasionally for step sequence     Balance  Static sitting-good  Dynamic sitting-good       ADL either performed or assessed with clinical judgement   ADL   Eating/Feeding: Set up;Sitting Eating/Feeding Details (indicate cue type and reason): self-feeding was initiated with the pt seated at bedside chair level                 Lower Body Dressing: Maximal assistance               Cognition Arousal/Alertness: Awake/alert Behavior During Therapy: Flat affect Overall Cognitive Status: Within Functional Limits for tasks assessed                           Pertinent Vitals/ Pain       Pain Assessment Pain Assessment: 0-10 Pain Score: 8  Pain Location: buttocks Pain Intervention(s): Repositioned, Limited activity within patient's tolerance         Frequency  Min 2X/week        Progress Toward Goals  OT Goals(current goals can now be  found in the care plan section)  Progress towards OT goals: Progressing toward goals  Acute Rehab OT Goals Patient Stated Goal: To transfer to the bedside chair OT Goal Formulation: With patient Time For Goal Achievement: 07/12/22 Potential to Achieve Goals: Good  Plan         AM-PAC OT "6 Clicks" Daily Activity     Outcome Measure   Help from another person eating meals?: None Help from  another person taking care of personal grooming?: A Little Help from another person toileting, which includes using toliet, bedpan, or urinal?: A Lot Help from another person bathing (including washing, rinsing, drying)?: A Lot Help from another person to put on and taking off regular upper body clothing?: A Little Help from another person to put on and taking off regular lower body clothing?: A Lot 6 Click Score: 16    End of Session Equipment Utilized During Treatment: Rolling walker (2 wheels);Gait belt  OT Visit Diagnosis: Unsteadiness on feet (R26.81);Muscle weakness (generalized) (M62.81);Pain   Activity Tolerance Patient tolerated treatment well   Patient Left in chair;with call bell/phone within reach;with chair alarm set           Time: 8250-5397 OT Time Calculation (min): 41 min  Charges: OT General Charges $OT Visit: 1 Visit OT Treatments $Therapeutic Activity: 38-52 mins    Leota Sauers, OTR/L 06/29/2022, 4:46 PM

## 2022-06-29 NOTE — Progress Notes (Addendum)
PROGRESS NOTE   [name]  NLG:[id number]    DOB: Jun 23, 1986    DOA: 06/24/2022  PCP: [name], MD   I have briefly reviewed patients previous medical records in [facility].  Chief Complaint  Patient presents with   Altered Mental Status    Brief Narrative:  36 y.o. male with past medical history significant for HIV, bipolar 1 disorder, depression, schizoaffective disorder, PTSD, seizure, chronic buttock wounds, hidradenitis suppurativa who presented to [facility] ED on 9/15 via EMS after he was found altered at home by his home health nurse.  Patient apparently lives alone and is nonambulatory but has a home health nurse who assists him during the day.  Due to patient's mental status, history obtained from EMS/ED provider reports as well as mother.  Mother reported that he drinks approximately 4-5 40 ounce beers a day and has been having some increased wounds/skin breakdown to groin as well as nausea/vomiting and diarrhea.  It is reported that he typically sits in his body wastes until being seen by his home health nurses; and he typically has been drinking alcohol and not taking in anything else.  Admitted for sepsis, POA due to emphysematous cystitis, left pyelitis, cellulitis of the groin and chronic hidradenitis suppurative of multiple regions along with acute respiratory failure with hypoxia.   Assessment & Plan:  Principal Problem:   Sepsis ([place]) Active Problems:   Protein-calorie malnutrition, severe (HCC)   Schizoaffective disorder, depressive type (HCC)   GERD (gastroesophageal reflux disease)   Cellulitis of groin   HIV disease (HCC)   Thrombocytopenia (HCC)   Anemia   Acute metabolic encephalopathy   Chronic alcohol use   Bipolar disorder (HCC)   AKI (acute kidney injury) ([place])   Emphysematous cystitis   Sepsis, POA Emphysematous cystitis/left pyelitis Cellulitis groin Chronic hidradenitis suppurativa involving groin creases, pubic region,  buttocks Patient presenting to the ED via EMS after being found by home health nurse altered while lying in his own excrement.  On arrival to the ED, patient was hypothermic, elevated WBC count of 15.1 with procalcitonin 0.60, lactic acid 2.0.  MRSA PCR positive.  CT abdomen/pelvis notable for emphysematous cystitis/pyelitis and irregular dermal thickening of the gluteal soft tissues consistent with cellulitis.  Urology was consulted, who recommended broad-spectrum antibiotics, Foley catheter placed to allow for maximal decompression with no plans for left ureteral stent placement given he has no hydronephrosis and this is likely an ascending infection.  Repeat CT abdomen/pelvis 9/18 shows resolution of gas to left renal collecting system, no gas noted within bladder. -- WBC 15.1>14.3>12.7>14.3>17.5>19.5 >20 -- Lactic acid 2.0>0.6 -- Blood cultures x2: No growth to date -- Urine culture: No growth -- De-escalated vancomycin/cefepime/metronidazole to Zyvox/Augmentin on 9/19, to complete 2 week course per urology -- Supportive care -- Foley catheter discontinued 9/19 and appears to be voiding without difficulty. -- CBC daily.  Although afebrile, leukocytosis worsening and hence would continue to treat with oral antibiotics and reassess CBCs in AM.  If the trend continues then may need further evaluation.  Chest x-ray report appreciated, bilateral infrahilar atelectasis/infiltrate.  Discontinued IV fluids.   Acute hypoxic respiratory failure, not POA Patient overnight 9/18 developed oxygen requirement, up to 5 L nasal cannula.  Lung sounds rhonchorous with chest x-ray with mild congestion versus infiltrates.  S/p a dose of IV Lasix and hypoxia has resolved.   Anemia of chronic medical disease Anemia panel with iron 50, TIBC low at 106, ferritin high at 386, folate 11.2,  vitamin B12 452; all consistent with anemia of chronic disease. -- Hgb 8.5>7.2>6.1>8.3>7.9>8.4>8.5 >9.1 -- Unclear if he was  transfused PRBCs yesterday.  Follow CBC daily.   Bipolar 1 disorder Schizoaffective disorder PTSD Depression/anxiety -- Lurasidone 120 mg p.o. daily -- Haldol 5 mg p.o. daily -- Lexapro 20 mg p.o. daily -- Hold home Xanax for now    Orthostatic hypotension/autonomic dysfunction -- Increased Midodrine to 15 mg p.o. 3 times daily AC   Hx HIV Follows with infectious disease outpatient, [facility].  Per review of EMR, notes state he is maintained on [place]; but although not listed on home medication list -- Continue Biktarvy -- Outpatient follow-up with infectious disease   EtOH use disorder EtOH level less than 10 on admission.  Counseled on need for complete cessation  Body mass index is 22.35 kg/m.  Nutritional Status Nutrition Problem: Increased nutrient needs Etiology: chronic illness Signs/Symptoms: estimated needs Interventions: Ensure Enlive (each supplement provides 350kcal and 20 grams of protein), MVI  Chronic inflammatory wounds: As per [facility] RN input 9/16 (see details in their note), diffuse area affected on the bilateral buttocks, medial and posterior thighs with scarring and pinpoint open areas, some with yellow drainage, others without.  Axilla with scarring consistent with at bedtime, no draining wounds.  Based on their evaluation, pressure injury [name].  Thereby pressure injury ruled out.    DVT prophylaxis: SCDs Start: 06/24/22 2131     Code Status: DNR:  Family Communication: None at bedside. Disposition:  Status is: Inpatient Remains inpatient appropriate because: Although on oral antibiotics now, WBC continues to trend upward and concern for poorly controlled infectious etiology and hence would like to monitor another 24 hours and reassess CBC in AM.     Consultants:   Urology  Procedures:   Foley catheter discontinued 9/19  Antimicrobials:   As above   Subjective:  Reports that his cough with yellow sputum and dyspnea are  present but much better than on admission.  Denies chest pain.  Reports chronic fecal incontinence.  Seems agreeable to SNF today.  Objective:   Vitals:   06/28/22 2000 06/28/22 2206 06/29/22 0210 06/29/22 0355  BP: 100/67 98/63  100/64  Pulse:  97 88 87  Resp:  16  18  Temp:  97.7 F (36.5 C) 98 F (36.7 C) 97.7 F (36.5 C)  TempSrc:  Oral Oral Oral  SpO2: 100% 100%  99%  Weight:      Height:        General exam: Young male, moderately built and nourished sitting up comfortably in bed eating his breakfast. Respiratory system: Occasional anterior rhonchi but appears to have good air entry.  Otherwise clear to auscultation.  No increased work of breathing. Cardiovascular system: S1 & S2 heard, RRR. No JVD, murmurs, rubs, gallops or clicks. No pedal edema.  Telemetry personally reviewed: Sinus rhythm. Gastrointestinal system: Abdomen is nondistended, soft and nontender. No organomegaly or masses felt. Normal bowel sounds heard. Central nervous system: Alert and oriented. No focal neurological deficits. Extremities: Symmetric 5 x 5 power. Skin: Did not examine today.  As per prior MDs evaluation: "Integumentary: Thickened skin with evidence of chronic hidradenitis over his suprapubic, groin, buttock region with several areas of small open wounds with no significant drainage of purulent material". Psychiatry: Judgement and insight appear at least somewhat impaired. Mood & affect appropriate.     Data Reviewed:   I have personally reviewed following labs and imaging studies   CBC: Recent Labs  Lab 06/24/22 1447 06/24/22 2154 06/26/22 1157 06/27/22 0254 06/27/22 1310 06/28/22 0307 06/29/22 0353  WBC 15.1*   < > 14.3* 17.5*  --  19.5* 20.0*  NEUTROABS 11.1*  --  10.8*  --   --   --   --   HGB 8.5*   < > 8.3* 7.9* 8.4* 8.5* 9.1*  HCT 26.2*   < > 26.3* 24.6* 26.3* 27.0* 28.5*  MCV 96.3   < > 97.8 97.6  --  98.5 98.3  PLT 76*   < > 60* 67*  --  83* 120*   < > = values in this  interval not displayed.    Basic Metabolic Panel: Recent Labs  Lab 06/24/22 1447 06/25/22 0257 06/26/22 0245 06/27/22 0254 06/28/22 0307 06/29/22 0353  NA 137 136 142 140 140 142  K 3.0* 3.3* 3.7 3.6 3.8 3.5  CL 99 102 112* 111 111 109  CO2 30 27 26 25 25 27   GLUCOSE 128* 92 84 108* 95 100*  BUN 20 14 13 12 12 14   CREATININE 1.43* 1.08 1.12 1.29* 1.19 1.00  CALCIUM 8.3* 7.7* 7.8* 7.9* 8.0* 8.2*  MG 2.2  --  2.1  --   --   --     Liver Function Tests: Recent Labs  Lab 06/24/22 1447 06/25/22 0257 06/26/22 0245  AST 13* 13* 11*  ALT 15 12 12   ALKPHOS 123 91 84  BILITOT 0.8 0.8 1.0  PROT 7.7 6.1* 5.8*  ALBUMIN 2.2* 1.7* 1.6*    CBG: Recent Labs  Lab 06/24/22 1447  [place]    Microbiology Studies:   Recent Results (from the past 240 hour(s))  Blood Culture (routine x 2)     Status: None (Preliminary result)   Collection Time: 06/24/22  2:52 PM   Specimen: BLOOD  Result Value Ref Range Status   Specimen Description   Final    BLOOD SITE NOT SPECIFIED Performed at [place] [address], [place], [place] [address]    Special Requests   Final    BOTTLES DRAWN AEROBIC AND ANAEROBIC Blood Culture adequate volume Performed at [place] [address], [place], [place] [address]    Culture   Final    NO GROWTH 4 DAYS Performed at [facility], [place] [address] [place], [place] [address]    Report Status PENDING  Incomplete  Resp Panel by RT-PCR (Flu A&B, Covid) Anterior Nasal Swab     Status: None   Collection Time: 06/24/22  2:58 PM   Specimen: Anterior Nasal Swab  Result Value Ref Range Status   SARS Coronavirus 2 by RT PCR NEGATIVE NEGATIVE Final    Comment: (NOTE) SARS-CoV-2 target nucleic acids are NOT DETECTED.  The SARS-CoV-2 RNA is generally detectable in upper respiratory specimens during the acute phase of infection. The lowest concentration of SARS-CoV-2 viral copies this assay can detect  is 138 copies/mL. A negative result does not preclude SARS-Cov-2 infection and should not be used as the sole basis for treatment or other patient management decisions. A negative result may occur with  improper specimen collection/handling, submission of specimen other than nasopharyngeal swab, presence of viral mutation(s) within the areas targeted by this assay, and inadequate number of viral copies(<138 copies/mL). A negative result must be combined with clinical observations, patient history, and epidemiological information. The expected result is Negative.  Fact Sheet for Patients:  EntrepreneurPulse.com.au  Fact Sheet for Healthcare Providers:  IncredibleEmployment.be  This test is no t yet approved or cleared by the [place] and  has been authorized for detection and/or diagnosis of SARS-CoV-2 by FDA under an Emergency Use Authorization (EUA). This EUA will remain  in effect (meaning this test can be used) for the duration of the COVID-19 declaration under Section 564(b)(1) of the Act, 21 U.S.C.section 360bbb-3(b)(1), unless the authorization is terminated  or revoked sooner.       Influenza A by PCR NEGATIVE NEGATIVE Final   Influenza B by PCR NEGATIVE NEGATIVE Final    Comment: (NOTE) The Xpert Xpress SARS-CoV-2/FLU/RSV plus assay is intended as an aid in the diagnosis of influenza from Nasopharyngeal swab specimens and should not be used as a sole basis for treatment. Nasal washings and aspirates are unacceptable for Xpert Xpress SARS-CoV-2/FLU/RSV testing.  Fact Sheet for Patients: EntrepreneurPulse.com.au  Fact Sheet for Healthcare Providers: IncredibleEmployment.be  This test is not yet approved or cleared by the [place] FDA and has been authorized for detection and/or diagnosis of SARS-CoV-2 by FDA under an Emergency Use Authorization (EUA). This EUA will remain in effect  (meaning this test can be used) for the duration of the COVID-19 declaration under Section 564(b)(1) of the Act, 21 U.S.C. section 360bbb-3(b)(1), unless the authorization is terminated or revoked.  Performed at [facility], [place] [address], [place], [place] [address]   MRSA Next Gen by PCR, Nasal     Status: Abnormal   Collection Time: 06/24/22 10:00 PM   Specimen: Nasal Mucosa; Nasal Swab  Result Value Ref Range Status   MRSA by PCR Next Gen DETECTED (A) NOT DETECTED Final    Comment: CRITICAL RESULT CALLED TO, READ BACK BY AND VERIFIED WITH: Dorise Bullion, RN (NOTE) The GeneXpert MRSA Assay (FDA approved for NASAL specimens only), is one component of a comprehensive MRSA colonization surveillance program. It is not intended to diagnose MRSA infection nor to guide or monitor treatment for MRSA infections. Test performance is not FDA approved in patients less than 36 years old. Performed at [facility], [place] [address] [place], [place] [address]   Blood Culture (routine x 2)     Status: None (Preliminary result)   Collection Time: 06/24/22 10:02 PM   Specimen: BLOOD LEFT HAND  Result Value Ref Range Status   Specimen Description   Final    BLOOD LEFT HAND Performed at [place] [address], [place], [place] [address]    Special Requests   Final    BOTTLES DRAWN AEROBIC ONLY Blood Culture results may not be optimal due to an inadequate volume of blood received in culture bottles Performed at [place] [address], [place], [place] [address]    Culture   Final    NO GROWTH 3 DAYS Performed at [facility], [place] [address] [place], [place] [address]    Report Status PENDING  Incomplete  Urine Culture     Status: None   Collection Time: 06/26/22  5:29 AM   Specimen: Urine, Catheterized  Result Value Ref Range Status   Specimen Description   Final    URINE,  CATHETERIZED Performed at [place] [address], [place], [place] [address]    Special Requests   Final    NONE Performed at [facility], [place] [address], [place], [place] [address]    Culture   Final    NO GROWTH Performed at [facility], [address] [place], [place] [address]    Report Status 06/27/2022 FINAL  Final    Radiology Studies:  DG CHEST PORT 1 VIEW  Result Date: 06/29/2022 CLINICAL DATA:  [id number].  Abdominal pain. EXAM: PORTABLE CHEST 1 VIEW COMPARISON:  Portable chest yesterday at 7:31 a.m. FINDINGS: 4:57 a.m. The heart is slightly enlarged. There is increased mild perihilar vascular congestion without overt edema. Right infrahilar opacity is again noted above the elevated hemidiaphragm and again could represent atelectasis or pneumonia. There is ill-defined left infrahilar opacity which could also be atelectasis or pneumonia. The remaining lungs are generally clear. The sulci are sharp. The mediastinum is normally outlined. IMPRESSION: 1. Bilateral infrahilar atelectasis or consolidation. 2. Increased central vascular prominence without overt edema. 3. Chronic elevated right diaphragm. Electronically Signed   By: Telford Nab M.D.   On: 06/29/2022 07:45   DG CHEST PORT 1 VIEW  Result Date: 06/28/2022 CLINICAL DATA:  Provided history: Increased congestion, shortness of breath. EXAM: PORTABLE CHEST 1 VIEW COMPARISON:  Prior chest radiographs 06/24/2022 and earlier. FINDINGS: Heart size within normal limits. Opacity within the medial right lung base, new from the prior examination of 06/24/2022 and suspicious for pneumonia. Mild ill-defined opacity within the left lung base, also new and with an appearance most suggestive of atelectasis. No evidence of pleural effusion or pneumothorax. No acute bony abnormality identified. IMPRESSION: Opacity in the medial right lung base, new from the prior chest radiograph of 06/24/2022 and  suspicious for pneumonia. Mild ill-defined opacity within the left lung base, also new and with an appearance most suggestive of atelectasis. Electronically Signed   By: Kellie Simmering D.O.   On: 06/28/2022 08:44    Scheduled Meds:    ALPRAZolam  2 mg Oral TID   amoxicillin-clavulanate  1 tablet Oral Q12H   bictegravir-emtricitabine-tenofovir AF  1 tablet Oral QAC breakfast   Chlorhexidine Gluconate Cloth  6 each Topical Q0600   escitalopram  20 mg Oral Daily   feeding supplement  237 mL Oral TID BM   folic acid  1 mg Oral Daily   haloperidol  5 mg Oral Daily   linezolid  600 mg Oral Q12H   lurasidone  120 mg Oral Q breakfast   midodrine  15 mg Oral TID WC   multivitamin with minerals  1 tablet Oral Daily   mupirocin ointment  1 Application Nasal BID   nystatin   Topical BID   pantoprazole  40 mg Oral QHS   thiamine  100 mg Oral Daily   valACYclovir  1,000 mg Oral Daily    Continuous Infusions:    sodium chloride 10 mL/hr at 06/28/22 0900   lactated ringers 75 mL/hr at 06/28/22 2312     LOS: 5 days     Vernell Leep, MD,  FACP, [facility], [facility], [facility] (Care Management Physician Certified) [place]  To contact the attending provider between 7A-7P or the covering provider during after hours 7P-7A, please log into the web site [email or web address] and access using universal [place] password for that web site. If you do not have the password, please call the hospital operator.  06/29/2022, 9:24 AM

## 2022-06-29 NOTE — Progress Notes (Signed)
Specialty air mattress ordered for patient & pt transferred safely to new bed. Large incontinent BM this am & this RN cleansed all wounds & changed all dressings per order.

## 2022-06-29 NOTE — Care Management Important Message (Signed)
Important Message  Patient Details IM Letter placed in Patients room. Name: [name] MRN: [id number] Date of Birth: [birth year]   Medicare Important Message Given:  Yes     Kerin Salen 06/29/2022, 1:59 PM

## 2022-06-30 DIAGNOSIS — L03314 Cellulitis of groin: Secondary | ICD-10-CM | POA: Diagnosis not present

## 2022-06-30 DIAGNOSIS — F1090 Alcohol use, unspecified, uncomplicated: Secondary | ICD-10-CM | POA: Diagnosis not present

## 2022-06-30 DIAGNOSIS — N3080 Other cystitis without hematuria: Secondary | ICD-10-CM | POA: Diagnosis not present

## 2022-06-30 LAB — BASIC METABOLIC PANEL
Anion gap: 4 — ABNORMAL LOW (ref 5–15)
BUN: 16 mg/dL (ref 6–20)
CO2: 26 mmol/L (ref 22–32)
Calcium: 8.4 mg/dL — ABNORMAL LOW (ref 8.9–10.3)
Chloride: 111 mmol/L (ref 98–111)
Creatinine, Ser: 1.11 mg/dL (ref 0.61–1.24)
GFR, Estimated: >60 mL/min (ref >60)
Glucose, Bld: 118 mg/dL — ABNORMAL HIGH (ref 70–99)
Potassium: 3.3 mmol/L — ABNORMAL LOW (ref 3.5–5.1)
Sodium: 141 mmol/L (ref 135–145)

## 2022-06-30 LAB — CBC
HCT: 28.8 % — ABNORMAL LOW (ref 39.0–52.0)
Hemoglobin: 8.8 g/dL — ABNORMAL LOW (ref 13.0–17.0)
MCH: 30.4 pg (ref 26.0–34.0)
MCHC: 30.6 g/dL (ref 30.0–36.0)
MCV: 99.7 fL (ref 80.0–100.0)
Platelets: 148 10*3/uL — ABNORMAL LOW (ref 150–400)
RBC: 2.89 MIL/uL — ABNORMAL LOW (ref 4.22–5.81)
RDW: 16.3 % — ABNORMAL HIGH (ref 11.5–15.5)
WBC: 16.5 10*3/uL — ABNORMAL HIGH (ref 4.0–10.5)
nRBC: 0 % (ref 0.0–0.2)

## 2022-06-30 LAB — CULTURE, BLOOD (ROUTINE X 2): Culture: NO GROWTH

## 2022-06-30 MED ORDER — POTASSIUM CHLORIDE CRYS ER 20 MEQ PO TBCR
40.0000 meq | EXTENDED_RELEASE_TABLET | Freq: Once | ORAL | Status: AC
  Administered 2022-06-30: 40 meq via ORAL
  Filled 2022-06-30: qty 2

## 2022-06-30 NOTE — Progress Notes (Signed)
PROGRESS NOTE   [name]  IYM:[id number]    DOB: [birth year]    DOA: 06/24/2022  PCP: [name], MD   I have briefly reviewed patients previous medical records in [facility].  Chief Complaint  Patient presents with   Altered Mental Status    Brief Narrative:  36 y.o. male with past medical history significant for HIV, bipolar 1 disorder, depression, schizoaffective disorder, PTSD, seizure, chronic buttock wounds, hidradenitis suppurativa who presented to [facility] ED on 9/15 via EMS after he was found altered at home by his home health nurse.  Patient apparently lives alone and is nonambulatory but has a home health nurse who assists him during the day.  Due to patient's mental status, history obtained from EMS/ED provider reports as well as mother.  Mother reported that he drinks approximately 4-5 40 ounce beers a day and has been having some increased wounds/skin breakdown to groin as well as nausea/vomiting and diarrhea.  It is reported that he typically sits in his body wastes until being seen by his home health nurses; and he typically has been drinking alcohol and not taking in anything else.  Admitted for sepsis, POA due to emphysematous cystitis, left pyelitis, cellulitis of the groin and chronic hidradenitis suppurative of multiple regions along with acute respiratory failure with hypoxia.  Clinically improved and medically optimized for DC to next level of care/SNF agreeable but according to [facility], may be difficult to place given prior history of agreeing to go to SNF and refusing at the last minute.   Assessment & Plan:  Principal Problem:   Sepsis ([place]) Active Problems:   Protein-calorie malnutrition, severe (HCC)   Schizoaffective disorder, depressive type (HCC)   GERD (gastroesophageal reflux disease)   Cellulitis of groin   HIV disease (HCC)   Thrombocytopenia (HCC)   Anemia   Acute metabolic encephalopathy   Chronic alcohol use   Bipolar disorder (HCC)   AKI  (acute kidney injury) ([place])   Emphysematous cystitis   Sepsis, POA Emphysematous cystitis/left pyelitis Cellulitis groin Chronic hidradenitis suppurativa involving groin creases, pubic region, buttocks Patient presenting to the ED via EMS after being found by home health nurse altered while lying in his own excrement.  On arrival to the ED, patient was hypothermic, elevated WBC count of 15.1 with procalcitonin 0.60, lactic acid 2.0.  MRSA PCR positive.  CT abdomen/pelvis notable for emphysematous cystitis/pyelitis and irregular dermal thickening of the gluteal soft tissues consistent with cellulitis.  Urology was consulted, who recommended broad-spectrum antibiotics, Foley catheter placed to allow for maximal decompression with no plans for left ureteral stent placement given he has no hydronephrosis and this is likely an ascending infection.  Repeat CT abdomen/pelvis 9/18 shows resolution of gas to left renal collecting system, no gas noted within bladder. -- WBC presented with WBC of 15 which had improved and then gradually went up to 20.  Starting to decrease, 16.5.  Continue trending daily CBC. -- Lactic acid 2.0>0.6 -- Blood cultures x2: No growth, final. -- Urine culture: No growth -- De-escalated vancomycin/cefepime/metronidazole to Zyvox/Augmentin on 9/19, to complete 2 week course per urology -- Supportive care -- Foley catheter discontinued 9/19 and appears to be voiding without difficulty. -- Afebrile, wounds personally examined with patient's RN on 9/21, improving, leukocytosis improving.  Continue current management.   Acute hypoxic respiratory failure, not POA Patient overnight 9/18 developed oxygen requirement, up to 5 L nasal cannula.  Lung sounds rhonchorous with chest x-ray with mild congestion versus infiltrates.  S/p a dose of IV Lasix and hypoxia has resolved.   Anemia of chronic medical disease Anemia panel with iron 50, TIBC low at 106, ferritin high at 386, folate 11.2,  vitamin B12 452; all consistent with anemia of chronic disease. -- Hgb presented with hemoglobin of 8.5 which dropped to a low of 6.1.  S/p transfusion.  Hemoglobin stable over the last couple of days.   Bipolar 1 disorder Schizoaffective disorder PTSD Depression/anxiety -- Lurasidone 120 mg p.o. daily -- Haldol 5 mg p.o. daily -- Lexapro 20 mg p.o. daily -- Hold home Xanax for now    Orthostatic hypotension/autonomic dysfunction -- Increased Midodrine to 15 mg p.o. 3 times daily AC   Hx HIV Follows with infectious disease outpatient, [place] [facility].  Per review of EMR, notes state he is maintained on [place]; but although not listed on home medication list -- Continue Biktarvy -- Outpatient follow-up with infectious disease   EtOH use disorder EtOH level less than 10 on admission.  Counseled on need for complete cessation  Possible dysphagia RN just reported that patient was coughing while drinking water, multiple times.  Consulted SLP for evaluation and recommendations.  Hypokalemia: Replace and follow.  Thrombocytopenia: Unclear etiology but improving.  Body mass index is 22.35 kg/m.  Nutritional Status Nutrition Problem: Increased nutrient needs Etiology: chronic illness Signs/Symptoms: estimated needs Interventions: Ensure Enlive (each supplement provides 350kcal and 20 grams of protein), MVI  Chronic inflammatory wounds: As per [facility] RN input 9/16 (see details in their note), diffuse area affected on the bilateral buttocks, medial and posterior thighs with scarring and pinpoint open areas, some with yellow drainage, others without.  Axilla with scarring consistent with at bedtime, no draining wounds.  Based on their evaluation, pressure injury [name].  Thereby pressure injury ruled out.  Improving.    DVT prophylaxis: SCDs Start: 06/24/22 2131     Code Status: DNR:  Family Communication: None at bedside. Disposition:  Status is: Inpatient Patient is  medically optimized for DC to SNF pending SLP evaluation, SNF bed and insurance.  Per communication with TOC, "has no bed offers, many previous placement attempts and he usually declines to go on the day of DC".   Consultants:   Urology  Procedures:   Foley catheter discontinued 9/19  Antimicrobials:   As above   Subjective:  Denies complaints.  RN just reported a few minutes ago that patient was coughing with water intake.  Objective:   Vitals:   06/29/22 0355 06/29/22 1348 06/29/22 2109 06/30/22 0538  BP: 100/64 109/85 92/72 98/71   Pulse: 87 73 100 66  Resp: 18 18 16 16   Temp: 97.7 F (36.5 C) 97.9 F (36.6 C) (!) 97.4 F (36.3 C) 97.6 F (36.4 C)  TempSrc: Oral Oral  Oral  SpO2: 99% 100% 100% 100%  Weight:      Height:        General exam: Young male, moderately built and nourished comfortably sleeping in bed. Respiratory system: Clear to auscultation.  No increased work of breathing. Cardiovascular system: S1 & S2 heard, RRR. No JVD, murmurs, rubs, gallops or clicks. No pedal edema.  Telemetry personally reviewed: SR (unclear why still on telemetry which was discontinued yesterday). Gastrointestinal system: Abdomen is nondistended, soft and nontender. No organomegaly or masses felt. Normal bowel sounds heard. Central nervous system: Alert and oriented. No focal neurological deficits. Extremities: Symmetric 5 x 5 power. Skin: Examined with patient's RN, took down dressings over his bottom, has multiple seizure-like  pinhole openings on his buttocks, posterior upper thighs but no active drainage or acute findings. Psychiatry: Judgement and insight appear at least somewhat impaired. Mood & affect appropriate.     Data Reviewed:   I have personally reviewed following labs and imaging studies   CBC: Recent Labs  Lab 06/24/22 1447 06/24/22 2154 06/26/22 1157 06/27/22 0254 06/28/22 0307 06/29/22 0353 06/30/22 0440  WBC 15.1*   < > 14.3*   < > 19.5* 20.0* 16.5*   NEUTROABS 11.1*  --  10.8*  --   --   --   --   HGB 8.5*   < > 8.3*   < > 8.5* 9.1* 8.8*  HCT 26.2*   < > 26.3*   < > 27.0* 28.5* 28.8*  MCV 96.3   < > 97.8   < > 98.5 98.3 99.7  PLT 76*   < > 60*   < > 83* 120* 148*   < > = values in this interval not displayed.    Basic Metabolic Panel: Recent Labs  Lab 06/24/22 1447 06/25/22 0257 06/26/22 0245 06/27/22 0254 06/28/22 0307 06/29/22 0353 06/30/22 0440  NA 137   < > 142 140 140 142 141  K 3.0*   < > 3.7 3.6 3.8 3.5 3.3*  CL 99   < > 112* 111 111 109 111  CO2 30   < > 26 25 25 27 26   GLUCOSE 128*   < > 84 108* 95 100* 118*  BUN 20   < > 13 12 12 14 16   CREATININE 1.43*   < > 1.12 1.29* 1.19 1.00 1.11  CALCIUM 8.3*   < > 7.8* 7.9* 8.0* 8.2* 8.4*  MG 2.2  --  2.1  --   --   --   --    < > = values in this interval not displayed.    Liver Function Tests: Recent Labs  Lab 06/24/22 1447 06/25/22 0257 06/26/22 0245  AST 13* 13* 11*  ALT 15 12 12   ALKPHOS 123 91 84  BILITOT 0.8 0.8 1.0  PROT 7.7 6.1* 5.8*  ALBUMIN 2.2* 1.7* 1.6*    CBG: Recent Labs  Lab 06/24/22 1447  GLUCAP 96    Microbiology Studies:   Recent Results (from the past 240 hour(s))  Blood Culture (routine x 2)     Status: None   Collection Time: 06/24/22  2:52 PM   Specimen: BLOOD  Result Value Ref Range Status   Specimen Description   Final    BLOOD SITE NOT SPECIFIED Performed at [facility], [place] [address] [place], [place] [address]    Special Requests   Final    BOTTLES DRAWN AEROBIC AND ANAEROBIC Blood Culture adequate volume Performed at [place] [address] [place], [place] [address]    Culture   Final    NO GROWTH 5 DAYS Performed at [facility], [place] [address] [place], [place] [address]    Report Status 06/29/2022 FINAL  Final  Resp Panel by RT-PCR (Flu A&B, Covid) Anterior Nasal Swab     Status: None   Collection Time: 06/24/22  2:58 PM   Specimen: Anterior Nasal Swab   Result Value Ref Range Status   SARS Coronavirus 2 by RT PCR NEGATIVE NEGATIVE Final    Comment: (NOTE) SARS-CoV-2 target nucleic acids are NOT DETECTED.  The SARS-CoV-2 RNA is generally detectable in upper respiratory specimens during the acute phase of infection. The  lowest concentration of SARS-CoV-2 viral copies this assay can detect is 138 copies/mL. A negative result does not preclude SARS-Cov-2 infection and should not be used as the sole basis for treatment or other patient management decisions. A negative result may occur with  improper specimen collection/handling, submission of specimen other than nasopharyngeal swab, presence of viral mutation(s) within the areas targeted by this assay, and inadequate number of viral copies(<138 copies/mL). A negative result must be combined with clinical observations, patient history, and epidemiological information. The expected result is Negative.  Fact Sheet for Patients:  EntrepreneurPulse.com.au  Fact Sheet for Healthcare Providers:  IncredibleEmployment.be  This test is no t yet approved or cleared by the [place] FDA and  has been authorized for detection and/or diagnosis of SARS-CoV-2 by FDA under an Emergency Use Authorization (EUA). This EUA will remain  in effect (meaning this test can be used) for the duration of the COVID-19 declaration under Section 564(b)(1) of the Act, 21 U.S.C.section 360bbb-3(b)(1), unless the authorization is terminated  or revoked sooner.       Influenza A by PCR NEGATIVE NEGATIVE Final   Influenza B by PCR NEGATIVE NEGATIVE Final    Comment: (NOTE) The Xpert Xpress SARS-CoV-2/FLU/RSV plus assay is intended as an aid in the diagnosis of influenza from Nasopharyngeal swab specimens and should not be used as a sole basis for treatment. Nasal washings and aspirates are unacceptable for Xpert Xpress SARS-CoV-2/FLU/RSV testing.  Fact Sheet for  Patients: EntrepreneurPulse.com.au  Fact Sheet for Healthcare Providers: IncredibleEmployment.be  This test is not yet approved or cleared by the [place] FDA and has been authorized for detection and/or diagnosis of SARS-CoV-2 by FDA under an Emergency Use Authorization (EUA). This EUA will remain in effect (meaning this test can be used) for the duration of the COVID-19 declaration under Section 564(b)(1) of the Act, 21 U.S.C. section 360bbb-3(b)(1), unless the authorization is terminated or revoked.  Performed at [facility], [place] [address], [place], [place] [address]   MRSA Next Gen by PCR, Nasal     Status: Abnormal   Collection Time: 06/24/22 10:00 PM   Specimen: Nasal Mucosa; Nasal Swab  Result Value Ref Range Status   MRSA by PCR Next Gen DETECTED (A) NOT DETECTED Final    Comment: CRITICAL RESULT CALLED TO, READ BACK BY AND VERIFIED WITH: Dorise Bullion, RN (NOTE) The GeneXpert MRSA Assay (FDA approved for NASAL specimens only), is one component of a comprehensive MRSA colonization surveillance program. It is not intended to diagnose MRSA infection nor to guide or monitor treatment for MRSA infections. Test performance is not FDA approved in patients less than 62 years old. Performed at [facility], [place] [address], [place], [place] [address]   Blood Culture (routine x 2)     Status: None   Collection Time: 06/24/22 10:02 PM   Specimen: BLOOD LEFT HAND  Result Value Ref Range Status   Specimen Description   Final    BLOOD LEFT HAND Performed at [place] [address], [place], [place] [address]    Special Requests   Final    BOTTLES DRAWN AEROBIC ONLY Blood Culture results may not be optimal due to an inadequate volume of blood received in culture bottles Performed at [place] [address], [place], [place] [address]    Culture    Final    NO GROWTH 5 DAYS Performed at [name], [place],  [place] [address]    Report Status 06/30/2022 FINAL  Final  Urine Culture     Status: None   Collection Time: 06/26/22  5:29 AM   Specimen: Urine, Catheterized  Result Value Ref Range Status   Specimen Description   Final    URINE, CATHETERIZED Performed at [place] [address], [place], [place] [address]    Special Requests   Final    NONE Performed at [facility], [place] [address], [place], [place] [address]    Culture   Final    NO GROWTH Performed at [facility], [place] [address], [place], [place] [address]    Report Status 06/27/2022 FINAL  Final    Radiology Studies:  DG CHEST PORT 1 VIEW  Result Date: 06/29/2022 CLINICAL DATA:  [id number].  Abdominal pain. EXAM: PORTABLE CHEST 1 VIEW COMPARISON:  Portable chest yesterday at 7:31 a.m. FINDINGS: 4:57 a.m. The heart is slightly enlarged. There is increased mild perihilar vascular congestion without overt edema. Right infrahilar opacity is again noted above the elevated hemidiaphragm and again could represent atelectasis or pneumonia. There is ill-defined left infrahilar opacity which could also be atelectasis or pneumonia. The remaining lungs are generally clear. The sulci are sharp. The mediastinum is normally outlined. IMPRESSION: 1. Bilateral infrahilar atelectasis or consolidation. 2. Increased central vascular prominence without overt edema. 3. Chronic elevated right diaphragm. Electronically Signed   By: Telford Nab M.D.   On: 06/29/2022 07:45    Scheduled Meds:    ALPRAZolam  2 mg Oral TID   amoxicillin-clavulanate  1 tablet Oral Q12H   bictegravir-emtricitabine-tenofovir AF  1 tablet Oral QAC breakfast   Chlorhexidine Gluconate Cloth  6 each Topical Q0600   escitalopram  20 mg Oral Daily   feeding supplement  237 mL Oral TID BM   folic acid  1 mg Oral Daily   haloperidol  5 mg Oral  Daily   linezolid  600 mg Oral Q12H   lurasidone  120 mg Oral Q breakfast   midodrine  15 mg Oral TID WC   multivitamin with minerals  1 tablet Oral Daily   mupirocin ointment  1 Application Nasal BID   nystatin   Topical BID   pantoprazole  40 mg Oral QHS   potassium chloride  40 mEq Oral Once   thiamine  100 mg Oral Daily   valACYclovir  1,000 mg Oral Daily    Continuous Infusions:    sodium chloride 10 mL/hr at 06/28/22 0900     LOS: 6 days     Vernell Leep, MD,  FACP, [facility], [facility], [facility] (Care Management Physician Certified) [place]  To contact the attending provider between 7A-7P or the covering provider during after hours 7P-7A, please log into the web site [email or web address] and access using universal [place] password for that web site. If you do not have the password, please call the hospital operator.  06/30/2022, 12:42 PM

## 2022-06-30 NOTE — TOC Progression Note (Addendum)
Transition of Care [facility]) - Progression Note    Patient Details  Name: [name] MRN: [id number] Date of Birth: Feb 03, 1986  Transition of Care [facility]) CM/SW [place], RN Phone Number: 06/30/2022, 10:24 AM  Clinical Narrative:  This RNCM attempted to reach patient via phone x3, unsuccessful. Will continue to follow.   -12:29 pm Spoke with patient regarding PT recommendation for SNF placement. Patient wants to pursue and has SNF preference in [place], [place] and [place] [facility].   - 4:44pm FL2 initiated, PASRR pending TOC will continue to follow.  Expected Discharge Plan: [place] Barriers to Discharge: Continued Medical Work up  Expected Discharge Plan and Services Expected Discharge Plan: [place] In-house Referral: NA   Post Acute Care Choice: Durable Medical Equipment, Home Health Living arrangements for the past 2 months: Apartment                                       Social Determinants of Health (SDOH) Interventions    Readmission Risk Interventions    02/15/2022    2:35 PM 08/24/2021    4:00 PM 08/24/2021   11:54 AM  Readmission Risk Prevention Plan  Transportation Screening Complete Complete   PCP or Specialist Appt within 3-5 Days  Complete Complete  HRI or [place]  Complete Complete  Social Work Consult for [place] Planning/Counseling   Complete  Palliative Care Screening  Complete Complete  Medication Review Press photographer) Complete Complete Complete  PCP or Specialist appointment within 3-5 days of discharge Complete    HRI or [place] Complete    SW Recovery Care/Counseling Consult Complete    [place] Not Applicable

## 2022-06-30 NOTE — Progress Notes (Signed)
Occupational Therapy Treatment Patient Details Name: [name] MRN: [id number] DOB: August 12, 1986 Today's Date: 06/30/2022   History of present illness Patient is a 36 year old male who presented to the hosptial after after being found at home by home aide with AMS. patient was admitted with sepsis, emdphysematous cystitis, cellulitis of groin, chronic hidragenitis suppeurative involving groin creases, acute hypoxic respiratory failure, anemia of chronic medical disease. PMH: HIV, bipolar disorder, depression, PTSD, schizoaffective disorder.   OT comments  Pt was motivated to participate in the session. He presented with continued buttocks discomfort, however improved from the previous session. He required min assist for transferring sit to stand. He was able to ambulate a few feet using a RW with min assist, though with slower movements; moderate unsteadiness noted. He further performed upper body grooming seated in the chair. He is making gradual functional progress. Continue OT plan of care to maximize his independence with self-care management.    Recommendations for follow up therapy are one component of a multi-disciplinary discharge planning process, led by the attending physician.  Recommendations may be updated based on patient status, additional functional criteria and insurance authorization.    Follow Up Recommendations  Skilled nursing-short term rehab (<3 hours/day)       Patient can return home with the following  A lot of help with bathing/dressing/bathroom;Assist for transportation;Assistance with cooking/housework;Help with stairs or ramp for entrance;A little help with walking and/or transfers         Precautions / Restrictions Precautions Precautions: Fall Precaution Comments: Contact precautions Restrictions Weight Bearing Restrictions: No       Mobility Bed Mobility Overal bed mobility: Needs Assistance Bed Mobility: Supine to Sit     Supine to sit: Min  guard     General bed mobility comments: Pt required slightly increased time with use of bed rail, in order to achieve    Transfers Overall transfer level: Needs assistance Equipment used: Rolling walker (2 wheels) Transfers: Sit to/from Stand, Bed to chair/wheelchair/BSC Sit to Stand: Min assist                     ADL either performed or assessed with clinical judgement   ADL   Eating/Feeding: Set up;Sitting   Grooming: Sitting;Set up Grooming Details (indicate cue type and reason): He performed face washing seated in the bedside chair.         Upper Body Dressing : Minimal assistance Upper Body Dressing Details (indicate cue type and reason): simulated              Cognition Arousal/Alertness: Awake/alert Behavior During Therapy: Flat affect Overall Cognitive Status: Within Functional Limits for tasks assessed            General Comments: patient was pleasant and cooperative                   Pertinent Vitals/ Pain       Pain Assessment Pain Assessment: Faces Faces Pain Scale: Hurts a little bit Pain Location: buttocks Pain Intervention(s): Limited activity within patient's tolerance         Frequency  Min 2X/week        Progress Toward Goals  OT Goals(current goals can now be found in the care plan section)  Progress towards OT goals: Progressing toward goals  Acute Rehab OT Goals Patient Stated Goal: To get stronger OT Goal Formulation: With patient Time For Goal Achievement: 07/12/22 Potential to Achieve Goals: Good  Plan  AM-PAC OT "6 Clicks" Daily Activity     Outcome Measure   Help from another person eating meals?: None Help from another person taking care of personal grooming?: A Little Help from another person toileting, which includes using toliet, bedpan, or urinal?: A Lot Help from another person bathing (including washing, rinsing, drying)?: A Lot Help from another person to put on and taking off  regular upper body clothing?: A Little Help from another person to put on and taking off regular lower body clothing?: A Lot 6 Click Score: 16    End of Session Equipment Utilized During Treatment: Rolling walker (2 wheels);Gait belt  OT Visit Diagnosis: Unsteadiness on feet (R26.81);Muscle weakness (generalized) (M62.81)   Activity Tolerance Patient tolerated treatment well   Patient Left in chair;with call bell/phone within reach;with chair alarm set   Nurse Communication  (Nurse cleared the pt for participation in the session)        Time: 3496-1164 OT Time Calculation (min): 19 min  Charges: OT General Charges $OT Visit: 1 Visit OT Treatments $Therapeutic Activity: 8-22 mins    Leota Sauers, OTR/L 06/30/2022, 2:26 PM

## 2022-06-30 NOTE — Evaluation (Signed)
Clinical/Bedside Swallow Evaluation Patient Details  Name: [name] MRN: [id number] Date of Birth: 1986/04/30  Today's Date: 06/30/2022 Time: SLP Start Time (ACUTE ONLY): 1715 SLP Stop Time (ACUTE ONLY): 1800 SLP Time Calculation (min) (ACUTE ONLY): 45 min  Past Medical History:  Past Medical History:  Diagnosis Date   Bipolar 1 disorder ([place])    Depression    Dizziness and giddiness 02/01/2016   Herpes genitalia    HIV disease ([place])    Hypertension    Hyponatremia    Hypothermia 08/24/2021   Migraine headache 02/01/2016   Peripheral neuropathy 10/01/2019   PTSD (post-traumatic stress disorder)    Schizoaffective disorder ([place])    Seizures ([place])    Past Surgical History:  Past Surgical History:  Procedure Laterality Date   BACK SURGERY     BIOPSY  02/26/2021   Procedure: BIOPSY;  Surgeon: Otis Brace, MD;  Location: [facility];  Service: Gastroenterology;;   COLONOSCOPY WITH PROPOFOL [name] 02/26/2021   Procedure: COLONOSCOPY WITH PROPOFOL;  Surgeon: Otis Brace, MD;  Location: [facility];  Service: Gastroenterology;  Laterality: [name];   ESOPHAGOGASTRODUODENOSCOPY (EGD) WITH PROPOFOL [name] 02/26/2021   Procedure: ESOPHAGOGASTRODUODENOSCOPY (EGD) WITH PROPOFOL;  Surgeon: Otis Brace, MD;  Location: [facility];  Service: Gastroenterology;  Laterality: [name];   ESOPHAGOGASTRODUODENOSCOPY (EGD) WITH PROPOFOL [name] 05/18/2021   Procedure: ESOPHAGOGASTRODUODENOSCOPY (EGD) WITH PROPOFOL;  Surgeon: Otis Brace, MD;  Location: [facility];  Service: Gastroenterology;  Laterality: [name];   HAND SURGERY     HPI:  Pt is a 36 yo male adm to [facility] with AMS by his home health caregiver. He lives alone and has help daily.  Per MD note, pt "admitted for sepsis, POA due to emphysematous cystitis, left pyelitis, cellulitis of the groin and chronic hidradenitis suppurative of multiple regions along with acute respiratory failure with hypoxia".  CXR 06/29/2022 Right infrahilar  opacity is again noted above the elevated hemidiaphragm and again could represent atelectasis or pneumonia,   Swallow evaluation ordered due to pt overtly having issues swallowing and coughing with liquids.    Assessment / Plan / Recommendation  Clinical Impression  Pt know to this SLP from prior swallow evaluation/treatment. Today [name] is greeted sitting up in chair with wet voice and dysarthria at baseline which does not clear with cued cough.  Set up oral suction and had pt clear his mouth of secretions several times during session.   Pt is able to feed himself fortunately - Pt with gross coughing requiring time to recover breath and expectoration of secretions mixed with food/liquid consumed.  Large boluses of thin and orange juice were aspirated.  Single small boluses of nectar thick liquids tolerated without cough or throat clearing.  Reviewed prior MBS studies *showing pt his video loops* to help him understand his dysphagia. Pt's primary deficit is his oral control and pharyngeal delay resulting in spillage of boluses into pharynx/larynx prior to swallow.  Pt's swallow is strong without retention *on prior exam*. Unfortunately he will continue to episodically aspirate - and likely manage when healthy.  He can improve his cough strength however and SLP will initiate RMST to improve expectoration strength for airway protection when he does aspirate.  Nectar liquids advised with meals, thin water between. RN informed and swallow precaution signs posted.  Using teach back and discussion with pt, he was agreeable to current plan. SLP Visit Diagnosis: Dysphagia, oropharyngeal phase (R13.12)    Aspiration Risk  Moderate aspiration risk;Severe aspiration risk    Diet Recommendation Nectar-thick liquid;Regular;Free  water protocol after oral care   Liquid Administration via: Cup;No straw;Spoon Medication Administration: Whole meds with puree Supervision: Patient able to self feed Compensations: Slow  rate;Small sips/bites;Other (Comment) Postural Changes: Seated upright at 90 degrees;Remain upright for at least 30 minutes after po intake    Other  Recommendations Oral Care Recommendations: Oral care before and after PO (suction) Other Recommendations: Order thickener from pharmacy    Recommendations for follow up therapy are one component of a multi-disciplinary discharge planning process, led by the attending physician.  Recommendations may be updated based on patient status, additional functional criteria and insurance authorization.  Follow up Recommendations Skilled nursing-short term rehab (<3 hours/day)      Assistance Recommended at Discharge Frequent or constant Supervision/Assistance  Functional Status Assessment Patient has had a recent decline in their functional status and/or demonstrates limited ability to make significant improvements in function in a reasonable and predictable amount of time  Frequency and Duration min 1 x/week  2 weeks       Prognosis Prognosis for Safe Diet Advancement: Good Barriers to Reach Goals: Time post onset      Swallow Study   General Date of Onset: 06/30/22 HPI: Pt is a 36 yo male adm to [facility] with AMS by his home health caregiver. He lives alone and has help daily.  Per MD note, pt "admitted for sepsis, POA due to emphysematous cystitis, left pyelitis, cellulitis of the groin and chronic hidradenitis suppurative of multiple regions along with acute respiratory failure with hypoxia".  CXR 06/29/2022 Right infrahilar opacity is again noted above the elevated hemidiaphragm and again could represent atelectasis or pneumonia,   Swallow evaluation ordered due to pt overtly having issues swallowing and coughing with liquids. Type of Study: Bedside Swallow Evaluation Diet Prior to this Study: Regular;Thin liquids Temperature Spikes Noted: No Respiratory Status: Room air History of Recent Intubation: No Behavior/Cognition:  Alert;Cooperative;Pleasant mood;Other (Comment);Confused (pt with some verbalizations that are off topic and he is more dysarthric than his known baseline with this SLP) Oral Cavity Assessment: Excessive secretions Oral Care Completed by SLP: Yes (provided pt with oral suction and he utilized during session) Oral Cavity - Dentition: Adequate natural dentition Vision: Functional for self-feeding Self-Feeding Abilities: Able to feed self Patient Positioning: Upright in chair Baseline Vocal Quality: Wet Volitional Cough: Congested;Weak Volitional Swallow: Able to elicit    Oral/Motor/Sensory Function Overall Oral Motor/Sensory Function: Generalized oral weakness (pt dysarthric, not swallowing secretions)   Ice Chips Ice chips: Not tested   Thin Liquid Thin Liquid: Impaired Presentation: Cup;Self Fed;Spoon Oral Phase Functional Implications: Other (comment) Pharyngeal  Phase Impairments: Cough - Immediate    Nectar Thick Nectar Thick Liquid: Impaired Presentation: Cup;Self Fed;Spoon;Straw Oral Phase Impairments: Reduced labial seal Oral phase functional implications: Other (comment) Pharyngeal Phase Impairments: Cough - Immediate   Honey Thick Honey Thick Liquid: Not tested   Puree Puree: Not tested   Solid     Solid: Impaired Oral Phase Functional Implications: Prolonged oral transit Pharyngeal Phase Impairments: Cough - Delayed      [name] 06/30/2022,6:34 PM  Kathleen Lime, MS [place] Office [phone] Pager [phone]

## 2022-06-30 NOTE — NC FL2 (Signed)
Interior LEVEL OF CARE SCREENING TOOL     IDENTIFICATION  Patient Name: [name] Birthdate: 10-03-1986 Sex: male Admission Date (Current Location): 06/24/2022  [facility] and [place] Number:  [employer] and Address:         Provider Number: [id number]  Attending Physician Name and Address:  Modena Jansky, MD  Relative Name and Phone Number:  [place]    Current Level of Care: Hospital Recommended Level of Care: [place] Prior Approval Number:    Date Approved/Denied:   PASRR Number: PASRR pending  Discharge Plan: SNF    Current Diagnoses: Patient Active Problem List   Diagnosis Date Noted   AKI (acute kidney injury) ([place]) 06/24/2022   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   Noncompliance with medications 02/20/2022   Hyperphosphatemia 93/81/0175   Alcoholic cirrhosis of liver ([place]) 02/17/2022   Chronic alcohol use 02/17/2022   Bipolar disorder ([place]) 02/17/2022   Sepsis ([place]) 02/13/2022   Malnutrition of moderate degree 10/20/2021   Palliative care by specialist    DNR (do not resuscitate) 10/14/2021   Aspiration pneumonia ([place]) 10/14/2021   FTT (failure to thrive) in adult 10/14/2021   Dysphagia 10/14/2021   Coagulopathy ([place]) 10/14/2021   Protein-calorie malnutrition, severe ([place]) 10/09/2021   Avascular necrosis of femoral head, left ([place]) 08/03/8526   Alcoholic cirrhosis of liver without ascites ([place]) 10/07/2021   Cellulitis of multiple sites of buttock 10/07/2021   Hypotension    Hypoxia 08/24/2021   Left hip pain    Transaminitis    Acute metabolic encephalopathy 78/24/2353   Cellulitis 05/07/2021   Lymphadenopathy 09/29/2020   Anemia    Upper urinary tract infection    Sepsis secondary to UTI ([place]) 03/14/2020   Cellulitis of groin 03/14/2020   HIV disease ([place])    Macrocytic anemia    Thrombocytopenia ([facility])    Prolonged QT interval    Acute respiratory failure due to COVID-19 ([place])  10/27/2019   GERD (gastroesophageal reflux disease) 10/27/2019   Hypokalemia 10/27/2019   Peripheral neuropathy 10/01/2019   PTSD (post-traumatic stress disorder) 07/23/2018   Dizziness and giddiness 02/01/2016   Migraine headache 02/01/2016   Schizoaffective disorder, depressive type ([place]) 05/13/2015   Severe alcohol dependence ([place]) 05/13/2015   Suicidal ideation 01/12/2014    Orientation RESPIRATION BLADDER Height & Weight     Self, Time, Situation, Place  Normal Continent (external cath) Weight: 81.1 kg Height:  6' 3"  (190.5 cm)  BEHAVIORAL SYMPTOMS/MOOD NEUROLOGICAL BOWEL NUTRITION STATUS      Incontinent Diet (regualr diet, thin fluids)  AMBULATORY STATUS COMMUNICATION OF NEEDS Skin   Limited Assist Verbally PU Stage and Appropriate Care (bil buttocks,mediposterior thighs w,w yellow drainageAxillae HS/Topical care soap water cleanse,rinse and gentle pat dry for all affected areas placement of silver hydrofiber (Aquacel Ag+ [employer]vantage[employer]son 61443) silicone foam buttock thigh lesions)                       Personal Care Assistance Level of Assistance  Bathing, Feeding, Dressing Bathing Assistance: Limited assistance Feeding assistance: Independent Dressing Assistance: Limited assistance     Functional Limitations Info  Sight, Hearing, Speech Sight Info: Adequate Hearing Info: Adequate Speech Info: Adequate    SPECIAL CARE FACTORS FREQUENCY  PT (By licensed PT), OT (By licensed OT)     PT Frequency: 5x per week OT Frequency: 5x per week            Contractures Contractures Info: Not present  Additional Factors Info  Code Status, Allergies Code Status Info: DNR Allergies Info: Dapsone, primaquine phosphate, pollen extract           Current Medications (06/30/2022):  This is the current hospital active medication list Current Facility-Administered Medications  Medication Dose Route Frequency Provider Last Rate Last Admin   0.9 %  sodium chloride  infusion   Intravenous PRN [place] Eric J, DO 10 mL/hr at 06/28/22 0900 Infusion Verify at 06/28/22 0900   acetaminophen (TYLENOL) tablet 650 mg  650 mg Oral Q6H PRN Cherylann Ratel A, DO   650 mg at 06/27/22 2330   Or   acetaminophen (TYLENOL) suppository 650 mg  650 mg Rectal Q6H PRN Marylyn Ishihara, Tyrone A, DO       ALPRAZolam Duanne Moron) tablet 2 mg  2 mg Oral TID [place] Eric J, DO   2 mg at 06/30/22 1205   amoxicillin-clavulanate (AUGMENTIN) 875-125 MG per tablet 1 tablet  1 tablet Oral Q12H [place] Donnamarie Poag, DO   1 tablet at 06/30/22 1206   bictegravir-emtricitabine-tenofovir AF (BIKTARVY) 50-200-25 MG per tablet 1 tablet  1 tablet Oral QAC breakfast [place] Eric J, DO   1 tablet at 06/30/22 1207   Chlorhexidine Gluconate Cloth 2 % PADS 6 each  6 each Topical Q0600 [place] Eric J, DO   6 each at 06/27/22 1746   escitalopram (LEXAPRO) tablet 20 mg  20 mg Oral Daily [place] Donnamarie Poag, DO   20 mg at 06/30/22 1206   feeding supplement (ENSURE ENLIVE / ENSURE PLUS) liquid 237 mL  237 mL Oral TID BM [place] Eric J, DO   237 mL at 07/62/26 3335   folic acid (FOLVITE) tablet 1 mg  1 mg Oral Daily Kyle, Tyrone A, DO   1 mg at 06/30/22 1206   guaiFENesin-dextromethorphan (ROBITUSSIN DM) 100-10 MG/5ML syrup 5 mL  5 mL Oral Q4H PRN [place] Eric J, DO   5 mL at 06/28/22 4562   haloperidol (HALDOL) tablet 5 mg  5 mg Oral Daily [place] Donnamarie Poag, DO   5 mg at 06/30/22 1207   linezolid (ZYVOX) tablet 600 mg  600 mg Oral Q12H [place] Donnamarie Poag, DO   600 mg at 06/30/22 1207   lip balm (CARMEX) ointment   Topical PRN [place] Donnamarie Poag, DO       lurasidone (LATUDA) tablet 120 mg  120 mg Oral Q breakfast [place] Donnamarie Poag, DO   120 mg at 06/30/22 1207   midodrine (PROAMATINE) tablet 15 mg  15 mg Oral TID WC [place] Eric J, DO   15 mg at 06/30/22 1206   morphine (PF) 2 MG/ML injection 1 mg  1 mg Intravenous Q2H PRN [place] Donnamarie Poag, DO   1 mg at 06/27/22 1251   multivitamin with minerals tablet 1 tablet  1 tablet Oral Daily Kyle,  Tyrone A, DO   1 tablet at 06/30/22 1206   mupirocin ointment (BACTROBAN) 2 % 1 Application  1 Application Nasal BID [place] Eric J, DO   1 Application at 56/38/93 2254   nystatin (MYCOSTATIN/NYSTOP) topical powder   Topical BID [place] Eric J, DO   Given at 06/29/22 1217   Oral care mouth rinse  15 mL Mouth Rinse PRN [place] Donnamarie Poag, DO       oxyCODONE (Oxy IR/ROXICODONE) immediate release tablet 5 mg  5 mg Oral Q4H PRN [place] Eric J, DO   5 mg at 06/28/22 0821   pantoprazole (PROTONIX) EC tablet 40 mg  40 mg  Oral QHS Eudelia Bunch, RPH   40 mg at 06/29/22 2253   potassium chloride SA (KLOR-CON M) CR tablet 40 mEq  40 mEq Oral Once Modena Jansky, MD       prochlorperazine (COMPAZINE) injection 10 mg  10 mg Intravenous Q6H PRN Marylyn Ishihara, Tyrone A, DO       thiamine (VITAMIN B1) tablet 100 mg  100 mg Oral Daily Kyle, Tyrone A, DO   100 mg at 06/30/22 1206   valACYclovir (VALTREX) tablet 1,000 mg  1,000 mg Oral Daily [place] Donnamarie Poag, DO   1,000 mg at 06/30/22 1221     Discharge Medications: Please see discharge summary for a list of discharge medications.  Relevant Imaging Results:  Relevant Lab Results:   Additional Information SSN: [id number]/ Psychotrophic information: See MAR  Roseanne Kaufman, RN

## 2022-07-01 ENCOUNTER — Other Ambulatory Visit (HOSPITAL_COMMUNITY): Payer: Self-pay

## 2022-07-01 DIAGNOSIS — N3080 Other cystitis without hematuria: Secondary | ICD-10-CM | POA: Diagnosis not present

## 2022-07-01 DIAGNOSIS — L03314 Cellulitis of groin: Secondary | ICD-10-CM | POA: Diagnosis not present

## 2022-07-01 DIAGNOSIS — F1090 Alcohol use, unspecified, uncomplicated: Secondary | ICD-10-CM | POA: Diagnosis not present

## 2022-07-01 LAB — CBC
HCT: 30 % — ABNORMAL LOW (ref 39.0–52.0)
Hemoglobin: 9.2 g/dL — ABNORMAL LOW (ref 13.0–17.0)
MCH: 30.9 pg (ref 26.0–34.0)
MCHC: 30.7 g/dL (ref 30.0–36.0)
MCV: 100.7 fL — ABNORMAL HIGH (ref 80.0–100.0)
Platelets: 153 10*3/uL (ref 150–400)
RBC: 2.98 MIL/uL — ABNORMAL LOW (ref 4.22–5.81)
RDW: 16.9 % — ABNORMAL HIGH (ref 11.5–15.5)
WBC: 11 10*3/uL — ABNORMAL HIGH (ref 4.0–10.5)
nRBC: 0 % (ref 0.0–0.2)

## 2022-07-01 LAB — COMPREHENSIVE METABOLIC PANEL
ALT: 9 U/L (ref 0–44)
AST: 13 U/L — ABNORMAL LOW (ref 15–41)
Albumin: 1.7 g/dL — ABNORMAL LOW (ref 3.5–5.0)
Alkaline Phosphatase: 101 U/L (ref 38–126)
Anion gap: 3 — ABNORMAL LOW (ref 5–15)
BUN: 15 mg/dL (ref 6–20)
CO2: 29 mmol/L (ref 22–32)
Calcium: 8.4 mg/dL — ABNORMAL LOW (ref 8.9–10.3)
Chloride: 110 mmol/L (ref 98–111)
Creatinine, Ser: 1.01 mg/dL (ref 0.61–1.24)
GFR, Estimated: >60 mL/min (ref >60)
Glucose, Bld: 102 mg/dL — ABNORMAL HIGH (ref 70–99)
Potassium: 4 mmol/L (ref 3.5–5.1)
Sodium: 142 mmol/L (ref 135–145)
Total Bilirubin: 0.3 mg/dL (ref 0.3–1.2)
Total Protein: 6.4 g/dL — ABNORMAL LOW (ref 6.5–8.1)

## 2022-07-01 LAB — MAGNESIUM: Magnesium: 1.9 mg/dL (ref 1.7–2.4)

## 2022-07-01 NOTE — Progress Notes (Signed)
PROGRESS NOTE   [name]  OMA:[id number]    DOB: [birth year]    DOA: 06/24/2022  PCP: [name], MD   I have briefly reviewed patients previous medical records in [facility]  Chief Complaint  Patient presents with   Altered Mental Status    Brief Narrative:  36 y.o. male with past medical history significant for HIV, bipolar 1 disorder, depression, schizoaffective disorder, PTSD, seizure, chronic buttock wounds, hidradenitis suppurativa who presented to [facility] ED on 9/15 via EMS after he was found altered at home by his home health nurse.  Patient apparently lives alone and is nonambulatory but has a home health nurse who assists him during the day.  Due to patient's mental status, history obtained from EMS/ED provider reports as well as mother.  Mother reported that he drinks approximately 4-5 40 ounce beers a day and has been having some increased wounds/skin breakdown to groin as well as nausea/vomiting and diarrhea.  It is reported that he typically sits in his body wastes until being seen by his home health nurses; and he typically has been drinking alcohol and not taking in anything else.  Admitted for sepsis, POA due to emphysematous cystitis, left pyelitis, cellulitis of the groin and chronic hidradenitis suppurative of multiple regions along with acute respiratory failure with hypoxia.  Clinically improved and medically optimized for DC to next level of care/SNF agreeable but according to [facility], may be difficult to place given prior history of agreeing to go to SNF and refusing at the last minute.   Assessment & Plan:  Principal Problem:   Sepsis ([place]) Active Problems:   Protein-calorie malnutrition, severe (HCC)   Schizoaffective disorder, depressive type (HCC)   GERD (gastroesophageal reflux disease)   Cellulitis of groin   HIV disease (HCC)   Thrombocytopenia (HCC)   Anemia   Acute metabolic encephalopathy   Chronic alcohol use   Bipolar disorder (HCC)   AKI  (acute kidney injury) ([place])   Emphysematous cystitis   Sepsis, POA Emphysematous cystitis/left pyelitis Cellulitis groin Chronic hidradenitis suppurativa involving groin creases, pubic region, buttocks Patient presenting to the ED via EMS after being found by home health nurse altered while lying in his own excrement.  On arrival to the ED, patient was hypothermic, elevated WBC count of 15.1 with procalcitonin 0.60, lactic acid 2.0.  MRSA PCR positive.  CT abdomen/pelvis notable for emphysematous cystitis/pyelitis and irregular dermal thickening of the gluteal soft tissues consistent with cellulitis.  Urology was consulted, who recommended broad-spectrum antibiotics, Foley catheter placed to allow for maximal decompression with no plans for left ureteral stent placement given he has no hydronephrosis and this is likely an ascending infection.  Repeat CT abdomen/pelvis 9/18 shows resolution of gas to left renal collecting system, no gas noted within bladder. -- WBC presented with WBC of 15 which had improved and then gradually went up to 20.  Leukocytosis has almost resolved, down to 11. -- Lactic acid 2.0>0.6 -- Blood cultures x2: No growth, final. -- Urine culture: No growth -- De-escalated vancomycin/cefepime/metronidazole to Zyvox/Augmentin on 9/19, to complete 2 week course per urology -- Supportive care -- Foley catheter discontinued 9/19 and appears to be voiding without difficulty. -- Afebrile, wounds personally examined with patient's RN on 9/21, clinically improved and leukocytosis almost resolved.  Continue current management.   Acute hypoxic respiratory failure, not POA Patient overnight 9/18 developed oxygen requirement, up to 5 L nasal cannula.  Lung sounds rhonchorous with chest x-ray with mild congestion versus  infiltrates.  S/p a dose of IV Lasix and hypoxia has resolved.   Anemia of chronic medical disease Anemia panel with iron 50, TIBC low at 106, ferritin high at 386, folate  11.2, vitamin B12 452; all consistent with anemia of chronic disease. -- Hgb presented with hemoglobin of 8.5 which dropped to a low of 6.1.  S/p transfusion.  Hemoglobin stable over the last several days.  Will minimize lab draws since there is no acute indication at this time.   Bipolar 1 disorder Schizoaffective disorder PTSD Depression/anxiety -- Lurasidone 120 mg p.o. daily -- Haldol 5 mg p.o. daily -- Lexapro 20 mg p.o. daily -- Hold home Xanax for now    Orthostatic hypotension/autonomic dysfunction -- Increased Midodrine to 15 mg p.o. 3 times daily AC   Hx HIV Follows with infectious disease outpatient, [facility].  Per review of EMR, notes state he is maintained on Orbisonia; but although not listed on home medication list -- Continue Biktarvy -- Outpatient follow-up with infectious disease   EtOH use disorder EtOH level less than 10 on admission.  Counseled on need for complete cessation  Dysphagia: SLP input appreciated.  Have recommended nectar thick liquid, regular diet with free water protocol after oral care.  Hypokalemia: Replaced  Thrombocytopenia: Resolved.  Body mass index is 22.35 kg/m.  Nutritional Status Nutrition Problem: Increased nutrient needs Etiology: chronic illness Signs/Symptoms: estimated needs Interventions: Ensure Enlive (each supplement provides 350kcal and 20 grams of protein), MVI  Chronic inflammatory wounds: As per [facility] RN input 9/16 (see details in their note), diffuse area affected on the bilateral buttocks, medial and posterior thighs with scarring and pinpoint open areas, some with yellow drainage, others without.  Axilla with scarring consistent with at bedtime, no draining wounds.  Based on their evaluation, pressure injury [name].  Thereby pressure injury ruled out.  Improving.    DVT prophylaxis: SCDs Start: 06/24/22 2131     Code Status: DNR:  Family Communication: Discussed in detail with patient's mother via  phone.  Patient consented.  Updated care and answered all questions.  She advised that if patient refuses to go to SNF then to contact her and she will try to convince him. Disposition:  Status is: Inpatient Patient is medically optimized for DC to SNF pending SLP evaluation, SNF bed and insurance.  Per communication with TOC, "has no bed offers, many previous placement attempts and he usually declines to go on the day of DC".   Consultants:   Urology  Procedures:   Foley catheter discontinued 9/19  Antimicrobials:   As above   Subjective:  Poor historian.  Denies complaints.  Mild intermittent dry cough.  No pain or dyspnea.  Objective:   Vitals:   06/29/22 2109 06/30/22 0538 06/30/22 1415 06/30/22 2103  BP: 92/72 98/71 101/76 95/67  Pulse: 100 66 78 73  Resp: 16 16 16 16   Temp: (!) 97.4 F (36.3 C) 97.6 F (36.4 C) 98.2 F (36.8 C) 98.3 F (36.8 C)  TempSrc:  Oral    SpO2: 100% 100% 100% 99%  Weight:      Height:        General exam: Young male, moderately built and nourished comfortably sleeping in bed.  Chronically ill looking. Respiratory system: Clear to auscultation.  No increased work of breathing. Cardiovascular system: S1 & S2 heard, RRR. No JVD, murmurs, rubs, gallops or clicks. No pedal edema.   Gastrointestinal system: Abdomen is nondistended, soft and nontender. No organomegaly or masses  felt. Normal bowel sounds heard. Central nervous system: Alert and oriented. No focal neurological deficits. Extremities: Symmetric 5 x 5 power. Skin: Examined with patient's RN, took down dressings over his bottom, has multiple seizure-like pinhole openings on his buttocks, posterior upper thighs but no active drainage or acute findings. Psychiatry: Judgement and insight appear at least somewhat impaired. Mood & affect appropriate.     Data Reviewed:   I have personally reviewed following labs and imaging studies   CBC: Recent Labs  Lab 06/24/22 1447  06/24/22 2154 06/26/22 1157 06/27/22 0254 06/29/22 0353 06/30/22 0440 07/01/22 0431  WBC 15.1*   < > 14.3*   < > 20.0* 16.5* 11.0*  NEUTROABS 11.1*  --  10.8*  --   --   --   --   HGB 8.5*   < > 8.3*   < > 9.1* 8.8* 9.2*  HCT 26.2*   < > 26.3*   < > 28.5* 28.8* 30.0*  MCV 96.3   < > 97.8   < > 98.3 99.7 100.7*  PLT 76*   < > 60*   < > 120* 148* 153   < > = values in this interval not displayed.    Basic Metabolic Panel: Recent Labs  Lab 06/24/22 1447 06/25/22 0257 06/26/22 0245 06/27/22 0254 06/28/22 0307 06/29/22 0353 06/30/22 0440 07/01/22 0431  NA 137   < > 142 140 140 142 141 142  K 3.0*   < > 3.7 3.6 3.8 3.5 3.3* 4.0  CL 99   < > 112* 111 111 109 111 110  CO2 30   < > 26 25 25 27 26 29   GLUCOSE 128*   < > 84 108* 95 100* 118* 102*  BUN 20   < > 13 12 12 14 16 15   CREATININE 1.43*   < > 1.12 1.29* 1.19 1.00 1.11 1.01  CALCIUM 8.3*   < > 7.8* 7.9* 8.0* 8.2* 8.4* 8.4*  MG 2.2  --  2.1  --   --   --   --  1.9   < > = values in this interval not displayed.    Liver Function Tests: Recent Labs  Lab 06/24/22 1447 06/25/22 0257 06/26/22 0245 07/01/22 0431  AST 13* 13* 11* 13*  ALT 15 12 12 9   ALKPHOS 123 91 84 101  BILITOT 0.8 0.8 1.0 0.3  PROT 7.7 6.1* 5.8* 6.4*  ALBUMIN 2.2* 1.7* 1.6* 1.7*    CBG: Recent Labs  Lab 06/24/22 1447  [place]    Microbiology Studies:   Recent Results (from the past 240 hour(s))  Blood Culture (routine x 2)     Status: None   Collection Time: 06/24/22  2:52 PM   Specimen: BLOOD  Result Value Ref Range Status   Specimen Description   Final    BLOOD SITE NOT SPECIFIED Performed at [facility], [place] [address] [place], [place] [address]    Special Requests   Final    BOTTLES DRAWN AEROBIC AND ANAEROBIC Blood Culture adequate volume Performed at [place] [address], [place], [place] [address]    Culture   Final    NO GROWTH 5 DAYS Performed at [facility],  [place] [address], [place], [place] [address]    Report Status 06/29/2022 FINAL  Final  Resp Panel by RT-PCR (Flu A&B, Covid) Anterior Nasal Swab     Status: None   Collection Time: 06/24/22  2:58  PM   Specimen: Anterior Nasal Swab  Result Value Ref Range Status   SARS Coronavirus 2 by RT PCR NEGATIVE NEGATIVE Final    Comment: (NOTE) SARS-CoV-2 target nucleic acids are NOT DETECTED.  The SARS-CoV-2 RNA is generally detectable in upper respiratory specimens during the acute phase of infection. The lowest concentration of SARS-CoV-2 viral copies this assay can detect is 138 copies/mL. A negative result does not preclude SARS-Cov-2 infection and should not be used as the sole basis for treatment or other patient management decisions. A negative result may occur with  improper specimen collection/handling, submission of specimen other than nasopharyngeal swab, presence of viral mutation(s) within the areas targeted by this assay, and inadequate number of viral copies(<138 copies/mL). A negative result must be combined with clinical observations, patient history, and epidemiological information. The expected result is Negative.  Fact Sheet for Patients:  EntrepreneurPulse.com.au  Fact Sheet for Healthcare Providers:  IncredibleEmployment.be  This test is no t yet approved or cleared by the [place] FDA and  has been authorized for detection and/or diagnosis of SARS-CoV-2 by FDA under an Emergency Use Authorization (EUA). This EUA will remain  in effect (meaning this test can be used) for the duration of the COVID-19 declaration under Section 564(b)(1) of the Act, 21 U.S.C.section 360bbb-3(b)(1), unless the authorization is terminated  or revoked sooner.       Influenza A by PCR NEGATIVE NEGATIVE Final   Influenza B by PCR NEGATIVE NEGATIVE Final    Comment: (NOTE) The Xpert Xpress SARS-CoV-2/FLU/RSV plus assay is intended as an aid in the  diagnosis of influenza from Nasopharyngeal swab specimens and should not be used as a sole basis for treatment. Nasal washings and aspirates are unacceptable for Xpert Xpress SARS-CoV-2/FLU/RSV testing.  Fact Sheet for Patients: EntrepreneurPulse.com.au  Fact Sheet for Healthcare Providers: IncredibleEmployment.be  This test is not yet approved or cleared by the [place] FDA and has been authorized for detection and/or diagnosis of SARS-CoV-2 by FDA under an Emergency Use Authorization (EUA). This EUA will remain in effect (meaning this test can be used) for the duration of the COVID-19 declaration under Section 564(b)(1) of the Act, 21 U.S.C. section 360bbb-3(b)(1), unless the authorization is terminated or revoked.  Performed at [facility], [place] [address] [place], [place] [address]   MRSA Next Gen by PCR, Nasal     Status: Abnormal   Collection Time: 06/24/22 10:00 PM   Specimen: Nasal Mucosa; Nasal Swab  Result Value Ref Range Status   MRSA by PCR Next Gen DETECTED (A) NOT DETECTED Final    Comment: CRITICAL RESULT CALLED TO, READ BACK BY AND VERIFIED WITH: Dorise Bullion, RN (NOTE) The GeneXpert MRSA Assay (FDA approved for NASAL specimens only), is one component of a comprehensive MRSA colonization surveillance program. It is not intended to diagnose MRSA infection nor to guide or monitor treatment for MRSA infections. Test performance is not FDA approved in patients less than 80 years old. Performed at [facility], [place] [address], [place], [place] [address]   Blood Culture (routine x 2)     Status: None   Collection Time: 06/24/22 10:02 PM   Specimen: BLOOD LEFT HAND  Result Value Ref Range Status   Specimen Description   Final    BLOOD LEFT HAND Performed at [facility] [address] [place], [place] [address]    Special Requests   Final    BOTTLES DRAWN  AEROBIC ONLY Blood Culture  results may not be optimal due to an inadequate volume of blood received in culture bottles Performed at [facility], [place] [address] [place], [place] [address]    Culture   Final    NO GROWTH 5 DAYS Performed at [facility], [place] [address] [place], [place] [address]    Report Status 06/30/2022 FINAL  Final  Urine Culture     Status: None   Collection Time: 06/26/22  5:29 AM   Specimen: Urine, Catheterized  Result Value Ref Range Status   Specimen Description   Final    URINE, CATHETERIZED Performed at [place] [address], [name], [place] [address]    Special Requests   Final    NONE Performed at [facility], [place] [address] [place], [place] [address]    Culture   Final    NO GROWTH Performed at [facility], [place] [address], [place], [place] [address]    Report Status 06/27/2022 FINAL  Final    Radiology Studies:  No results found.  Scheduled Meds:    ALPRAZolam  2 mg Oral TID   amoxicillin-clavulanate  1 tablet Oral Q12H   bictegravir-emtricitabine-tenofovir AF  1 tablet Oral QAC breakfast   Chlorhexidine Gluconate Cloth  6 each Topical Q0600   escitalopram  20 mg Oral Daily   feeding supplement  237 mL Oral TID BM   folic acid  1 mg Oral Daily   haloperidol  5 mg Oral Daily   linezolid  600 mg Oral Q12H   lurasidone  120 mg Oral Q breakfast   midodrine  15 mg Oral TID WC   multivitamin with minerals  1 tablet Oral Daily   nystatin   Topical BID   pantoprazole  40 mg Oral QHS   thiamine  100 mg Oral Daily   valACYclovir  1,000 mg Oral Daily    Continuous Infusions:    sodium chloride 10 mL/hr at 06/28/22 0900     LOS: 7 days     Vernell Leep, MD,  FACP, [facility], [facility], [facility] (Care Management Physician Certified) [place]  To contact the attending provider between 7A-7P or the covering provider during after  hours 7P-7A, please log into the web site [email or web address] and access using universal  password for that web site. If you do not have the password, please call the hospital operator.  07/01/2022, 10:42 AM

## 2022-07-01 NOTE — NC FL2 (Deleted)
[place] LEVEL OF CARE SCREENING TOOL     IDENTIFICATION  Patient Name: [name] Birthdate: [birth year] Sex: male Admission Date (Current Location): 06/24/2022  [facility] and [place] Number:  [employer] and Address:         Provider Number: [id number]  Attending Physician Name and Address:  Modena Jansky, MD  Relative Name and Phone Number:  [place]    Current Level of Care: Hospital Recommended Level of Care: [place] Prior Approval Number:    Date Approved/Denied:   PASRR Number: PASRR pending  Discharge Plan: SNF    Current Diagnoses: Patient Active Problem List   Diagnosis Date Noted   AKI (acute kidney injury) ([place]) 06/24/2022   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   Noncompliance with medications 02/20/2022   Hyperphosphatemia 87/56/4332   Alcoholic cirrhosis of liver ([place]) 02/17/2022   Chronic alcohol use 02/17/2022   Bipolar disorder ([place]) 02/17/2022   Sepsis ([place]) 02/13/2022   Malnutrition of moderate degree 10/20/2021   Palliative care by specialist    DNR (do not resuscitate) 10/14/2021   Aspiration pneumonia ([place]) 10/14/2021   FTT (failure to thrive) in adult 10/14/2021   Dysphagia 10/14/2021   Coagulopathy ([place]) 10/14/2021   Protein-calorie malnutrition, severe ([place]) 10/09/2021   Avascular necrosis of femoral head, left ([place]) 95/18/8416   Alcoholic cirrhosis of liver without ascites ([place]) 10/07/2021   Cellulitis of multiple sites of buttock 10/07/2021   Hypotension    Hypoxia 08/24/2021   Left hip pain    Transaminitis    Acute metabolic encephalopathy 60/63/0160   Cellulitis 05/07/2021   Lymphadenopathy 09/29/2020   Anemia    Upper urinary tract infection    Sepsis secondary to UTI ([place]) 03/14/2020   Cellulitis of groin 03/14/2020   HIV disease ([place])    Macrocytic anemia    Thrombocytopenia (HCC)    Prolonged QT interval    Acute respiratory failure due to COVID-19 ([place])  10/27/2019   GERD (gastroesophageal reflux disease) 10/27/2019   Hypokalemia 10/27/2019   Peripheral neuropathy 10/01/2019   PTSD (post-traumatic stress disorder) 07/23/2018   Dizziness and giddiness 02/01/2016   Migraine headache 02/01/2016   Schizoaffective disorder, depressive type ([place]) 05/13/2015   Severe alcohol dependence ([place]) 05/13/2015   Suicidal ideation 01/12/2014    Orientation RESPIRATION BLADDER Height & Weight     Self, Time, Situation, Place  Normal Continent (external cath) Weight: 81.1 kg Height:  6' 3"  (190.5 cm)  BEHAVIORAL SYMPTOMS/MOOD NEUROLOGICAL BOWEL NUTRITION STATUS      Incontinent Diet (regualr diet, thin fluids)  AMBULATORY STATUS COMMUNICATION OF NEEDS Skin   Limited Assist Verbally PU Stage and Appropriate Care (bil buttocks,mediposterior thighs w,w yellow drainageAxillae HS/Topical care soap water cleanse,rinse and gentle pat dry for all affected areas placement of silver hydrofiber (Aquacel Ag+ [employer]vantage[employer] 10932) silicone foam buttock thigh lesions)                       Personal Care Assistance Level of Assistance  Bathing, Feeding, Dressing Bathing Assistance: Limited assistance Feeding assistance: Independent Dressing Assistance: Limited assistance     Functional Limitations Info  Sight, Hearing, Speech Sight Info: Adequate Hearing Info: Adequate Speech Info: Adequate    SPECIAL CARE FACTORS FREQUENCY  PT (By licensed PT), OT (By licensed OT)     PT Frequency: 5x per week OT Frequency: 5x per week            Contractures Contractures Info: Not present  Additional Factors Info  Code Status, Allergies Code Status Info: DNR Allergies Info: Dapsone, primaquine phosphate, pollen extract           Current Medications (07/01/2022):  This is the current hospital active medication list Current Facility-Administered Medications  Medication Dose Route Frequency Provider Last Rate Last Admin   0.9 %  sodium chloride  infusion   Intravenous PRN [place] Eric J, DO 10 mL/hr at 06/28/22 0900 Infusion Verify at 06/28/22 0900   acetaminophen (TYLENOL) tablet 650 mg  650 mg Oral Q6H PRN Cherylann Ratel A, DO   650 mg at 06/27/22 5409   Or   acetaminophen (TYLENOL) suppository 650 mg  650 mg Rectal Q6H PRN Marylyn Ishihara, Tyrone A, DO       ALPRAZolam Duanne Moron) tablet 2 mg  2 mg Oral TID [place] Eric J, DO   2 mg at 07/01/22 1325   amoxicillin-clavulanate (AUGMENTIN) 875-125 MG per tablet 1 tablet  1 tablet Oral Q12H [place] Donnamarie Poag, DO   1 tablet at 07/01/22 1324   bictegravir-emtricitabine-tenofovir AF (BIKTARVY) 50-200-25 MG per tablet 1 tablet  1 tablet Oral QAC breakfast [place] Eric J, DO   1 tablet at 07/01/22 1325   Chlorhexidine Gluconate Cloth 2 % PADS 6 each  6 each Topical Q0600 [place] Eric J, DO   6 each at 07/01/22 8119   escitalopram (LEXAPRO) tablet 20 mg  20 mg Oral Daily [place] Donnamarie Poag, DO   20 mg at 07/01/22 1324   feeding supplement (ENSURE ENLIVE / ENSURE PLUS) liquid 237 mL  237 mL Oral TID BM [place] Eric J, DO   237 mL at 14/78/29 5621   folic acid (FOLVITE) tablet 1 mg  1 mg Oral Daily Kyle, Tyrone A, DO   1 mg at 07/01/22 1323   guaiFENesin-dextromethorphan (ROBITUSSIN DM) 100-10 MG/5ML syrup 5 mL  5 mL Oral Q4H PRN [place] Eric J, DO   5 mL at 06/28/22 3086   haloperidol (HALDOL) tablet 5 mg  5 mg Oral Daily [place] Donnamarie Poag, DO   5 mg at 07/01/22 1323   linezolid (ZYVOX) tablet 600 mg  600 mg Oral Q12H [place] Donnamarie Poag, DO   600 mg at 07/01/22 1324   lip balm (CARMEX) ointment   Topical PRN [place] Eric J, DO       lurasidone (LATUDA) tablet 120 mg  120 mg Oral Q breakfast [place] Donnamarie Poag, DO   120 mg at 07/01/22 1323   midodrine (PROAMATINE) tablet 15 mg  15 mg Oral TID WC [place] Donnamarie Poag, DO   15 mg at 07/01/22 1325   morphine (PF) 2 MG/ML injection 1 mg  1 mg Intravenous Q2H PRN [place] Donnamarie Poag, DO   1 mg at 06/27/22 1251   multivitamin with minerals tablet 1 tablet  1 tablet Oral Daily Kyle,  Tyrone A, DO   1 tablet at 07/01/22 1323   nystatin (MYCOSTATIN/NYSTOP) topical powder   Topical BID [place] Eric J, DO   Given at 07/01/22 1326   Oral care mouth rinse  15 mL Mouth Rinse PRN [place] Eric J, DO       oxyCODONE (Oxy IR/ROXICODONE) immediate release tablet 5 mg  5 mg Oral Q4H PRN [place] Eric J, DO   5 mg at 06/28/22 0821   pantoprazole (PROTONIX) EC tablet 40 mg  40 mg Oral QHS Eudelia Bunch, RPH   40 mg at 06/30/22 2147   prochlorperazine (COMPAZINE) injection 10 mg  10 mg Intravenous Q6H  PRN Cherylann Ratel A, DO       thiamine (VITAMIN B1) tablet 100 mg  100 mg Oral Daily Kyle, Tyrone A, DO   100 mg at 07/01/22 1324   valACYclovir (VALTREX) tablet 1,000 mg  1,000 mg Oral Daily [place] Donnamarie Poag, DO   1,000 mg at 07/01/22 1322     Discharge Medications: Please see discharge summary for a list of discharge medications.  Relevant Imaging Results:  Relevant Lab Results:   Additional Information SSN: [id number]/ Psychotrophic information: See MAR  Roseanne Kaufman, RN

## 2022-07-01 NOTE — Progress Notes (Signed)
Physical Therapy Treatment Patient Details Name: [name] MRN: [id number] DOB: 04/03/1986 Today's Date: 07/01/2022   History of Present Illness Patient is a 36 year old male who presented to the hosptial after after being found at home by home aide with AMS. patient was admitted with sepsis, emdphysematous cystitis, cellulitis of groin, chronic hidragenitis suppeurative involving groin creases, acute hypoxic respiratory failure, anemia of chronic medical disease. PMH: HIV, bipolar disorder, depression, PTSD, schizoaffective disorder.    PT Comments    Pt pleasant and agreeable to be seen. Supervision for bed mobility with increased time, min guard for transfers with +2 for safety; pt required multimodal cuing for advancement of BLE feet, pt unable or unwilling to lift. Attempted ambulation but pt fatigued and requsted sitting in recliner, reporting 8/10 pain in his buttocks. Visualized b/l feet, swelling of L>R, directed pt to complete ankle pumps and keep BLEs elevated as much as possible. Discharge destination remains appropriate. We will continue to follow acutely.    Recommendations for follow up therapy are one component of a multi-disciplinary discharge planning process, led by the attending physician.  Recommendations may be updated based on patient status, additional functional criteria and insurance authorization.  Follow Up Recommendations  Skilled nursing-short term rehab (<3 hours/day) (SNFvsHHPT dependent on 24/7 supervision/assistance at home) Can patient physically be transported by private vehicle: Yes   Assistance Recommended at Discharge Frequent or constant Supervision/Assistance  Patient can return home with the following A lot of help with walking and/or transfers;A lot of help with bathing/dressing/bathroom;Assistance with cooking/housework;Assistance with feeding;Direct supervision/assist for medications management;Direct supervision/assist for financial  management;Assist for transportation;Help with stairs or ramp for entrance   Equipment Recommendations  None recommended by PT (TBD)    Recommendations for Other Services       Precautions / Restrictions Precautions Precautions: Fall Precaution Comments: Contact precautions Restrictions Weight Bearing Restrictions: No     Mobility  Bed Mobility Overal bed mobility: Needs Assistance Bed Mobility: Supine to Sit     Supine to sit: Supervision     General bed mobility comments: Pt required increased time and cuing but was able to perform all bed mobility without assistance    Transfers Overall transfer level: Needs assistance Equipment used: Rolling walker (2 wheels) Transfers: Sit to/from Stand, Bed to chair/wheelchair/BSC Sit to Stand: Min guard, From elevated surface   Step pivot transfers: From elevated surface, Min guard       General transfer comment: Pt min guard, no physical assist required for sit to stand transfer, +2 for safety from elevated surface. Pt required multimodal cuing for advancement of BLE, pt did not lift feet well.    Ambulation/Gait               General Gait Details: deferred   Stairs             Wheelchair Mobility    Modified Rankin (Stroke Patients Only)       Balance Overall balance assessment: Needs assistance Sitting-balance support: Feet supported, No upper extremity supported Sitting balance-Leahy Scale: Good   Postural control: Posterior lean Standing balance support: Reliant on assistive device for balance, During functional activity, Bilateral upper extremity supported Standing balance-Leahy Scale: Poor                              Cognition Arousal/Alertness: Awake/alert Behavior During Therapy: Flat affect, WFL for tasks assessed/performed Overall Cognitive Status: Within Functional Limits for tasks  assessed                                 General Comments: patient was  pleasant and cooperative        Exercises Total Joint Exercises Ankle Circles/Pumps: AROM, Both, 10 reps    General Comments        Pertinent Vitals/Pain Pain Assessment Pain Assessment: 0-10 Pain Score: 8  Pain Location: buttocks Pain Descriptors / Indicators: Grimacing Pain Intervention(s): Monitored during session, Limited activity within patient's tolerance, Repositioned    Home Living                          Prior Function            PT Goals (current goals can now be found in the care plan section) Acute Rehab PT Goals Patient Stated Goal: none stated PT Goal Formulation: With patient Time For Goal Achievement: 07/12/22 Potential to Achieve Goals: Good Progress towards PT goals: Progressing toward goals    Frequency    Min 3X/week      PT Plan Current plan remains appropriate    Co-evaluation              AM-PAC PT "6 Clicks" Mobility   Outcome Measure  Help needed turning from your back to your side while in a flat bed without using bedrails?: A Little Help needed moving from lying on your back to sitting on the side of a flat bed without using bedrails?: A Little Help needed moving to and from a bed to a chair (including a wheelchair)?: A Little Help needed standing up from a chair using your arms (e.g., wheelchair or bedside chair)?: A Little Help needed to walk in hospital room?: A Little Help needed climbing 3-5 steps with a railing? : A Little 6 Click Score: 18    End of Session Equipment Utilized During Treatment: Gait belt Activity Tolerance: Patient limited by fatigue Patient left: in chair;with call bell/phone within reach;with chair alarm set Nurse Communication: Mobility status PT Visit Diagnosis: Muscle weakness (generalized) (M62.81)     Time: 6286-3817 PT Time Calculation (min) (ACUTE ONLY): 17 min  Charges:  $Therapeutic Activity: 8-22 mins                     Coolidge Breeze, PT, DPT [facility]  Department Office: [phone]   Coolidge Breeze 07/01/2022, 1:54 PM

## 2022-07-02 DIAGNOSIS — B20 Human immunodeficiency virus [HIV] disease: Secondary | ICD-10-CM | POA: Diagnosis not present

## 2022-07-02 DIAGNOSIS — L03314 Cellulitis of groin: Secondary | ICD-10-CM | POA: Diagnosis not present

## 2022-07-02 DIAGNOSIS — F310 Bipolar disorder, current episode hypomanic: Secondary | ICD-10-CM | POA: Diagnosis not present

## 2022-07-02 NOTE — Progress Notes (Signed)
PROGRESS NOTE   [name]  ZTI:[id number]    DOB: 11/02/1985    DOA: 06/24/2022  PCP: [name], MD   I have briefly reviewed patients previous medical records in [facility].  Chief Complaint  Patient presents with   Altered Mental Status    Brief Narrative:  36 y.o. male with past medical history significant for HIV, bipolar 1 disorder, depression, schizoaffective disorder, PTSD, seizure, chronic buttock wounds, hidradenitis suppurativa who presented to [facility] ED on 9/15 via EMS after he was found altered at home by his home health nurse.  Patient apparently lives alone and is nonambulatory but has a home health nurse who assists him during the day.  Due to patient's mental status, history obtained from EMS/ED provider reports as well as mother.  Mother reported that he drinks approximately 4-5 40 ounce beers a day and has been having some increased wounds/skin breakdown to groin as well as nausea/vomiting and diarrhea.  It is reported that he typically sits in his body wastes until being seen by his home health nurses; and he typically has been drinking alcohol and not taking in anything else.  Admitted for sepsis, POA due to emphysematous cystitis, left pyelitis, cellulitis of the groin and chronic hidradenitis suppurative of multiple regions along with acute respiratory failure with hypoxia.  Clinically improved and medically optimized for DC to next level of care/SNF agreeable but according to [facility], may be difficult to place given prior history of agreeing to go to SNF and refusing at the last minute.  Remains medically optimized for DC to SNF pending bed.   Assessment & Plan:  Principal Problem:   Sepsis ([place]) Active Problems:   Protein-calorie malnutrition, severe (HCC)   Schizoaffective disorder, depressive type (HCC)   GERD (gastroesophageal reflux disease)   Cellulitis of groin   HIV disease (HCC)   Thrombocytopenia (HCC)   Anemia   Acute metabolic  encephalopathy   Chronic alcohol use   Bipolar disorder (HCC)   AKI (acute kidney injury) ([place])   Emphysematous cystitis   Sepsis, POA Emphysematous cystitis/left pyelitis Cellulitis groin Chronic hidradenitis suppurativa involving groin creases, pubic region, buttocks Patient presenting to the ED via EMS after being found by home health nurse altered while lying in his own excrement.  On arrival to the ED, patient was hypothermic, elevated WBC count of 15.1 with procalcitonin 0.60, lactic acid 2.0.  MRSA PCR positive.  CT abdomen/pelvis notable for emphysematous cystitis/pyelitis and irregular dermal thickening of the gluteal soft tissues consistent with cellulitis.  Urology was consulted, who recommended broad-spectrum antibiotics, Foley catheter placed to allow for maximal decompression with no plans for left ureteral stent placement given he has no hydronephrosis and this is likely an ascending infection.  Repeat CT abdomen/pelvis 9/18 shows resolution of gas to left renal collecting system, no gas noted within bladder. -- WBC presented with WBC of 15 which had improved and then gradually went up to 20.  Leukocytosis has almost resolved, down to 11. -- Lactic acid 2.0>0.6 -- Blood cultures x2: No growth, final. -- Urine culture: No growth -- De-escalated vancomycin/cefepime/metronidazole to Zyvox/Augmentin on 9/19, to complete 2 week course per urology -- Supportive care -- Foley catheter discontinued 9/19 and appears to be voiding without difficulty. -- Afebrile, wounds personally examined with patient's RN on 9/21, clinically improved and leukocytosis almost resolved.  Continue current management. -As discussed with patient's RN 9/23, per her evaluation during dressing change after an incontinent BM, wounds look clean without acute  finding or drainage.   Acute hypoxic respiratory failure, not POA Patient overnight 9/18 developed oxygen requirement, up to 5 L nasal cannula.  Lung sounds  rhonchorous with chest x-ray with mild congestion versus infiltrates.  S/p a dose of IV Lasix and hypoxia has resolved.   Anemia of chronic medical disease Anemia panel with iron 50, TIBC low at 106, ferritin high at 386, folate 11.2, vitamin B12 452; all consistent with anemia of chronic disease. -- Hgb presented with hemoglobin of 8.5 which dropped to a low of 6.1.  S/p transfusion.  Hemoglobin stable over the last several days.  Will minimize lab draws since there is no acute indication at this time.   Bipolar 1 disorder Schizoaffective disorder PTSD Depression/anxiety -- Lurasidone 120 mg p.o. daily -- Haldol 5 mg p.o. daily -- Lexapro 20 mg p.o. daily -- Hold home Xanax for now    Orthostatic hypotension/autonomic dysfunction -- Increased Midodrine to 15 mg p.o. 3 times daily AC   Hx HIV Follows with infectious disease outpatient, [facility].  Per review of EMR, notes state he is maintained on [place]; but although not listed on home medication list -- Continue Biktarvy -- Outpatient follow-up with infectious disease   EtOH use disorder EtOH level less than 10 on admission.  Counseled on need for complete cessation  Dysphagia: SLP input appreciated.  Have recommended nectar thick liquid, regular diet with free water protocol after oral care.  Hypokalemia: Replaced  Thrombocytopenia: Resolved.  Body mass index is 22.35 kg/m.  Nutritional Status Nutrition Problem: Increased nutrient needs Etiology: chronic illness Signs/Symptoms: estimated needs Interventions: Ensure Enlive (each supplement provides 350kcal and 20 grams of protein), MVI  Chronic inflammatory wounds: As per [facility] RN input 9/16 (see details in their note), diffuse area affected on the bilateral buttocks, medial and posterior thighs with scarring and pinpoint open areas, some with yellow drainage, others without.  Axilla with scarring consistent with at bedtime, no draining wounds.  Based  on their evaluation, pressure injury [name].  Thereby pressure injury ruled out.  Improving.    DVT prophylaxis: SCDs Start: 06/24/22 2131     Code Status: DNR:  Family Communication: Discussed in detail with patient's mother via phone.  Patient consented.  Updated care and answered all questions.  She advised that if patient refuses to go to SNF then to contact her and she will try to convince him. Disposition:  Status is: Inpatient Patient is medically optimized for DC to SNF pending SLP evaluation, SNF bed and insurance.  Per communication with TOC, "has no bed offers, many previous placement attempts and he usually declines to go on the day of DC".   Consultants:   Urology  Procedures:   Foley catheter discontinued 9/19  Antimicrobials:   As above   Subjective:  Seen in room along with patient's RN.  Nursing had just finished cleaning him after he had an incontinent loose BM/chronic.  Nursing indicated that the wounds look clean without acute findings and no drainage.  Patient himself denies any complaints.  Objective:   Vitals:   06/30/22 2103 07/01/22 1338 07/01/22 2047 07/02/22 0358  BP: 95/67 (!) 107/55 101/69 112/77  Pulse: 73 (!) 102 89 86  Resp: 16 20 18 18   Temp: 98.3 F (36.8 C) 97.7 F (36.5 C) 97.8 F (36.6 C) 97.9 F (36.6 C)  TempSrc:  Oral Oral Oral  SpO2: 99% 100% 99%   Weight:      Height:  No new or acute findings compared to yesterday's evaluation.  General exam: Young male, moderately built and nourished comfortably sleeping in bed.  Chronically ill looking. Respiratory system: Clear to auscultation.  No increased work of breathing. Cardiovascular system: S1 & S2 heard, RRR. No JVD, murmurs, rubs, gallops or clicks. No pedal edema.   Gastrointestinal system: Abdomen is nondistended, soft and nontender. No organomegaly or masses felt. Normal bowel sounds heard. Central nervous system: Alert and oriented. No focal neurological  deficits. Extremities: Symmetric 5 x 5 power. Skin: Examined with patient's RN, took down dressings over his bottom, has multiple seizure-like pinhole openings on his buttocks, posterior upper thighs but no active drainage or acute findings. Psychiatry: Judgement and insight appear at least somewhat impaired. Mood & affect appropriate.     Data Reviewed:   I have personally reviewed following labs and imaging studies   CBC: Recent Labs  Lab 06/26/22 1157 06/27/22 0254 06/29/22 0353 06/30/22 0440 07/01/22 0431  WBC 14.3*   < > 20.0* 16.5* 11.0*  NEUTROABS 10.8*  --   --   --   --   HGB 8.3*   < > 9.1* 8.8* 9.2*  HCT 26.3*   < > 28.5* 28.8* 30.0*  MCV 97.8   < > 98.3 99.7 100.7*  PLT 60*   < > 120* 148* 153   < > = values in this interval not displayed.    Basic Metabolic Panel: Recent Labs  Lab 06/26/22 0245 06/27/22 0254 06/28/22 0307 06/29/22 0353 06/30/22 0440 07/01/22 0431  NA 142 140 140 142 141 142  K 3.7 3.6 3.8 3.5 3.3* 4.0  CL 112* 111 111 109 111 110  CO2 26 25 25 27 26 29   GLUCOSE 84 108* 95 100* 118* 102*  BUN 13 12 12 14 16 15   CREATININE 1.12 1.29* 1.19 1.00 1.11 1.01  CALCIUM 7.8* 7.9* 8.0* 8.2* 8.4* 8.4*  MG 2.1  --   --   --   --  1.9    Liver Function Tests: Recent Labs  Lab 06/26/22 0245 07/01/22 0431  AST 11* 13*  ALT 12 9  ALKPHOS 84 101  BILITOT 1.0 0.3  PROT 5.8* 6.4*  ALBUMIN 1.6* 1.7*    CBG: No results for input(s): "GLUCAP" in the last 168 hours.   Microbiology Studies:   Recent Results (from the past 240 hour(s))  Blood Culture (routine x 2)     Status: None   Collection Time: 06/24/22  2:52 PM   Specimen: BLOOD  Result Value Ref Range Status   Specimen Description   Final    BLOOD SITE NOT SPECIFIED Performed at [facility] [address] [place], [place] [address]    Special Requests   Final    BOTTLES DRAWN AEROBIC AND ANAEROBIC Blood Culture adequate volume Performed at [facility] [address], [place], [place] [address]    Culture   Final    NO GROWTH 5 DAYS Performed at [facility], [place] [address], [place], [place] [address]    Report Status 06/29/2022 FINAL  Final  Resp Panel by RT-PCR (Flu A&B, Covid) Anterior Nasal Swab     Status: None   Collection Time: 06/24/22  2:58 PM   Specimen: Anterior Nasal Swab  Result Value Ref Range Status   SARS Coronavirus 2 by RT PCR NEGATIVE NEGATIVE Final    Comment: (NOTE) SARS-CoV-2 target nucleic acids are NOT DETECTED.  The SARS-CoV-2 RNA  is generally detectable in upper respiratory specimens during the acute phase of infection. The lowest concentration of SARS-CoV-2 viral copies this assay can detect is 138 copies/mL. A negative result does not preclude SARS-Cov-2 infection and should not be used as the sole basis for treatment or other patient management decisions. A negative result may occur with  improper specimen collection/handling, submission of specimen other than nasopharyngeal swab, presence of viral mutation(s) within the areas targeted by this assay, and inadequate number of viral copies(<138 copies/mL). A negative result must be combined with clinical observations, patient history, and epidemiological information. The expected result is Negative.  Fact Sheet for Patients:  EntrepreneurPulse.com.au  Fact Sheet for Healthcare Providers:  IncredibleEmployment.be  This test is no t yet approved or cleared by the [place] FDA and  has been authorized for detection and/or diagnosis of SARS-CoV-2 by FDA under an Emergency Use Authorization (EUA). This EUA will remain  in effect (meaning this test can be used) for the duration of the COVID-19 declaration under Section 564(b)(1) of the Act, 21 U.S.C.section 360bbb-3(b)(1), unless the authorization is terminated  or revoked sooner.       Influenza A by PCR NEGATIVE NEGATIVE Final    Influenza B by PCR NEGATIVE NEGATIVE Final    Comment: (NOTE) The Xpert Xpress SARS-CoV-2/FLU/RSV plus assay is intended as an aid in the diagnosis of influenza from Nasopharyngeal swab specimens and should not be used as a sole basis for treatment. Nasal washings and aspirates are unacceptable for Xpert Xpress SARS-CoV-2/FLU/RSV testing.  Fact Sheet for Patients: EntrepreneurPulse.com.au  Fact Sheet for Healthcare Providers: IncredibleEmployment.be  This test is not yet approved or cleared by the [place] FDA and has been authorized for detection and/or diagnosis of SARS-CoV-2 by FDA under an Emergency Use Authorization (EUA). This EUA will remain in effect (meaning this test can be used) for the duration of the COVID-19 declaration under Section 564(b)(1) of the Act, 21 U.S.C. section 360bbb-3(b)(1), unless the authorization is terminated or revoked.  Performed at [facility], [place] [address] [place], [place] [address]   MRSA Next Gen by PCR, Nasal     Status: Abnormal   Collection Time: 06/24/22 10:00 PM   Specimen: Nasal Mucosa; Nasal Swab  Result Value Ref Range Status   MRSA by PCR Next Gen DETECTED (A) NOT DETECTED Final    Comment: CRITICAL RESULT CALLED TO, READ BACK BY AND VERIFIED WITH: Dorise Bullion, RN (NOTE) The GeneXpert MRSA Assay (FDA approved for NASAL specimens only), is one component of a comprehensive MRSA colonization surveillance program. It is not intended to diagnose MRSA infection nor to guide or monitor treatment for MRSA infections. Test performance is not FDA approved in patients less than 43 years old. Performed at [facility], [place] [address] [place], [place] [address]   Blood Culture (routine x 2)     Status: None   Collection Time: 06/24/22 10:02 PM   Specimen: BLOOD LEFT HAND  Result Value Ref Range Status   Specimen Description   Final    BLOOD LEFT  HAND Performed at [facility] [address] [place], [place] [address]    Special Requests   Final    BOTTLES DRAWN AEROBIC ONLY Blood Culture results may not be optimal due to an inadequate volume of blood received in culture bottles Performed at [facility] [address] [place], [place] [address]    Culture   Final    NO  GROWTH 5 DAYS Performed at [facility], [place] [address] [place], [place] [address]    Report Status 06/30/2022 FINAL  Final  Urine Culture     Status: None   Collection Time: 06/26/22  5:29 AM   Specimen: Urine, Catheterized  Result Value Ref Range Status   Specimen Description   Final    URINE, CATHETERIZED Performed at [facility] [address], [place], [place] [address]    Special Requests   Final    NONE Performed at [facility], [place] [address] [place], [place] [address]    Culture   Final    NO GROWTH Performed at [facility], [place] [address], [place], [place] [address]    Report Status 06/27/2022 FINAL  Final    Radiology Studies:  No results found.  Scheduled Meds:    ALPRAZolam  2 mg Oral TID   amoxicillin-clavulanate  1 tablet Oral Q12H   bictegravir-emtricitabine-tenofovir AF  1 tablet Oral QAC breakfast   Chlorhexidine Gluconate Cloth  6 each Topical Q0600   escitalopram  20 mg Oral Daily   feeding supplement  237 mL Oral TID BM   folic acid  1 mg Oral Daily   haloperidol  5 mg Oral Daily   linezolid  600 mg Oral Q12H   lurasidone  120 mg Oral Q breakfast   midodrine  15 mg Oral TID WC   multivitamin with minerals  1 tablet Oral Daily   nystatin   Topical BID   pantoprazole  40 mg Oral QHS   thiamine  100 mg Oral Daily   valACYclovir  1,000 mg Oral Daily    Continuous Infusions:    sodium chloride 10 mL/hr at 06/28/22 0900     LOS: 8 days     Vernell Leep, MD,  FACP, [facility], [facility], [facility] (Care Management Physician  Certified) [place]  To contact the attending provider between 7A-7P or the covering provider during after hours 7P-7A, please log into the web site [email or web address] and access using universal [place] password for that web site. If you do not have the password, please call the hospital operator.  07/02/2022, 2:27 PM

## 2022-07-02 NOTE — Progress Notes (Signed)
Pt had multiple open lesions on inner buttocks on posterior thighs with no drainage present from old dressings when changed.

## 2022-07-03 ENCOUNTER — Inpatient Hospital Stay (HOSPITAL_COMMUNITY): Payer: Medicare HMO

## 2022-07-03 DIAGNOSIS — L03314 Cellulitis of groin: Secondary | ICD-10-CM | POA: Diagnosis not present

## 2022-07-03 DIAGNOSIS — B20 Human immunodeficiency virus [HIV] disease: Secondary | ICD-10-CM | POA: Diagnosis not present

## 2022-07-03 DIAGNOSIS — F310 Bipolar disorder, current episode hypomanic: Secondary | ICD-10-CM | POA: Diagnosis not present

## 2022-07-03 NOTE — Progress Notes (Signed)
PROGRESS NOTE   [name]  NKN:[id number]    DOB: April 21, 1986    DOA: 06/24/2022  PCP: [name], MD   I have briefly reviewed patients previous medical records in [facility].  Chief Complaint  Patient presents with   Altered Mental Status    Brief Narrative:  36 y.o. male with past medical history significant for HIV, bipolar 1 disorder, depression, schizoaffective disorder, PTSD, seizure, chronic buttock wounds, hidradenitis suppurativa who presented to [facility] ED on 9/15 via EMS after he was found altered at home by his home health nurse.  Patient apparently lives alone and is nonambulatory but has a home health nurse who assists him during the day.  Due to patient's mental status, history obtained from EMS/ED provider reports as well as mother.  Mother reported that he drinks approximately 4-5 40 ounce beers a day and has been having some increased wounds/skin breakdown to groin as well as nausea/vomiting and diarrhea.  It is reported that he typically sits in his body wastes until being seen by his home health nurses; and he typically has been drinking alcohol and not taking in anything else.  Admitted for sepsis, POA due to emphysematous cystitis, left pyelitis, cellulitis of the groin and chronic hidradenitis suppurative of multiple regions along with acute respiratory failure with hypoxia.  Clinically improved and medically optimized for DC to next level of care/SNF agreeable but according to [facility], may be difficult to place given prior history of agreeing to go to SNF and refusing at the last minute.  Remains medically optimized for DC to SNF pending bed.  Appears to be difficult to place.  Weekday TOC to follow-up tomorrow.   Assessment & Plan:  Principal Problem:   Sepsis ([place]) Active Problems:   Protein-calorie malnutrition, severe (HCC)   Schizoaffective disorder, depressive type (HCC)   GERD (gastroesophageal reflux disease)   Cellulitis of groin   HIV disease  (HCC)   Thrombocytopenia (HCC)   Anemia   Acute metabolic encephalopathy   Chronic alcohol use   Bipolar disorder (HCC)   AKI (acute kidney injury) ([place])   Emphysematous cystitis   Sepsis, POA Emphysematous cystitis/left pyelitis Cellulitis groin Chronic hidradenitis suppurativa involving groin creases, pubic region, buttocks Patient presenting to the ED via EMS after being found by home health nurse altered while lying in his own excrement.  On arrival to the ED, patient was hypothermic, elevated WBC count of 15.1 with procalcitonin 0.60, lactic acid 2.0.  MRSA PCR positive.  CT abdomen/pelvis notable for emphysematous cystitis/pyelitis and irregular dermal thickening of the gluteal soft tissues consistent with cellulitis.  Urology was consulted, who recommended broad-spectrum antibiotics, Foley catheter placed to allow for maximal decompression with no plans for left ureteral stent placement given he has no hydronephrosis and this is likely an ascending infection.  Repeat CT abdomen/pelvis 9/18 shows resolution of gas to left renal collecting system, no gas noted within bladder. -- WBC presented with WBC of 15 which had improved and then gradually went up to 20.  Leukocytosis has almost resolved, down to 11. -- Lactic acid 2.0>0.6 -- Blood cultures x2: No growth, final. -- Urine culture: No growth -- De-escalated vancomycin/cefepime/metronidazole to Zyvox/Augmentin on 9/19, to complete 2 week course per urology -- Supportive care -- Foley catheter discontinued 9/19 and appears to be voiding without difficulty. -- Improved.   Acute hypoxic respiratory failure, not POA Patient overnight 9/18 developed oxygen requirement, up to 5 L nasal cannula.  Lung sounds rhonchorous with chest  x-ray with mild congestion versus infiltrates.  S/p a dose of IV Lasix and hypoxia has resolved.   Anemia of chronic medical disease Anemia panel with iron 50, TIBC low at 106, ferritin high at 386, folate 11.2,  vitamin B12 452; all consistent with anemia of chronic disease. -- Hgb presented with hemoglobin of 8.5 which dropped to a low of 6.1.  S/p transfusion.  Hemoglobin stable over the last several days.  Will minimize lab draws since there is no acute indication at this time.   Bipolar 1 disorder Schizoaffective disorder PTSD Depression/anxiety -- Lurasidone 120 mg p.o. daily -- Haldol 5 mg p.o. daily -- Lexapro 20 mg p.o. daily -- Hold home Xanax for now    Orthostatic hypotension/autonomic dysfunction -- Increased Midodrine to 15 mg p.o. 3 times daily AC   Hx HIV Follows with infectious disease outpatient, [facility].  Per review of EMR, notes state he is maintained on [place]; but although not listed on home medication list -- Continue Biktarvy -- Outpatient follow-up with infectious disease   EtOH use disorder EtOH level less than 10 on admission.  Counseled on need for complete cessation  Dysphagia: SLP input appreciated.  Have recommended nectar thick liquid, regular diet with free water protocol after oral care.  Hypokalemia: Replaced  Thrombocytopenia: Resolved.  Body mass index is 22.35 kg/m.  Nutritional Status Nutrition Problem: Increased nutrient needs Etiology: chronic illness Signs/Symptoms: estimated needs Interventions: Ensure Enlive (each supplement provides 350kcal and 20 grams of protein), MVI  Chronic inflammatory wounds: As per [facility] RN input 9/16 (see details in their note), diffuse area affected on the bilateral buttocks, medial and posterior thighs with scarring and pinpoint open areas, some with yellow drainage, others without.  Axilla with scarring consistent with at bedtime, no draining wounds.  Based on their evaluation, pressure injury [name].  Thereby pressure injury ruled out.  Improving.    DVT prophylaxis: SCDs Start: 06/24/22 2131     Code Status: DNR:  Family Communication: None at bedside. Disposition:  Status is:  Inpatient Patient is medically optimized for DC to SNF pending SLP evaluation, SNF bed and insurance.  Per communication with TOC, "has no bed offers, many previous placement attempts and he usually declines to go on the day of DC".   Consultants:   Urology  Procedures:   Foley catheter discontinued 9/19  Antimicrobials:   As above   Subjective:  Reports some left-sided teeth pain.  No other complaints reported.  Objective:   Vitals:   06/30/22 2103 07/01/22 1338 07/01/22 2047 07/02/22 0358  BP: 95/67 (!) 107/55 101/69 112/77  Pulse: 73 (!) 102 89 86  Resp: 16 20 18 18   Temp: 98.3 F (36.8 C) 97.7 F (36.5 C) 97.8 F (36.6 C) 97.9 F (36.6 C)  TempSrc:  Oral Oral Oral  SpO2: 99% 100% 99%   Weight:      Height:         General exam: Young male, moderately built and nourished comfortably sleeping in bed.  Chronically ill looking. Respiratory system: Clear to auscultation.  No increased work of breathing. Cardiovascular system: S1 & S2 heard, RRR. No JVD, murmurs, rubs, gallops or clicks. No pedal edema.   Gastrointestinal system: Abdomen is nondistended, soft and nontender. No organomegaly or masses felt. Normal bowel sounds heard. Central nervous system: Alert and oriented. No focal neurological deficits. Extremities: Symmetric 5 x 5 power. Skin: Examined with patient's RN, took down dressings over his bottom, has multiple seizure-like  pinhole openings on his buttocks, posterior upper thighs but no active drainage or acute findings. Psychiatry: Judgement and insight appear at least somewhat impaired. Mood & affect appropriate.  ENT: Possible left upper molar caries.  Advised outpatient dentist follow-up.    Data Reviewed:   I have personally reviewed following labs and imaging studies   CBC: Recent Labs  Lab 06/26/22 1157 06/27/22 0254 06/29/22 0353 06/30/22 0440 07/01/22 0431  WBC 14.3*   < > 20.0* 16.5* 11.0*  NEUTROABS 10.8*  --   --   --   --   HGB  8.3*   < > 9.1* 8.8* 9.2*  HCT 26.3*   < > 28.5* 28.8* 30.0*  MCV 97.8   < > 98.3 99.7 100.7*  PLT 60*   < > 120* 148* 153   < > = values in this interval not displayed.    Basic Metabolic Panel: Recent Labs  Lab 06/27/22 0254 06/28/22 0307 06/29/22 0353 06/30/22 0440 07/01/22 0431  NA 140 140 142 141 142  K 3.6 3.8 3.5 3.3* 4.0  CL 111 111 109 111 110  CO2 25 25 27 26 29   GLUCOSE 108* 95 100* 118* 102*  BUN 12 12 14 16 15   CREATININE 1.29* 1.19 1.00 1.11 1.01  CALCIUM 7.9* 8.0* 8.2* 8.4* 8.4*  MG  --   --   --   --  1.9    Liver Function Tests: Recent Labs  Lab 07/01/22 0431  AST 13*  ALT 9  ALKPHOS 101  BILITOT 0.3  PROT 6.4*  ALBUMIN 1.7*    CBG: No results for input(s): "GLUCAP" in the last 168 hours.   Microbiology Studies:   Recent Results (from the past 240 hour(s))  Blood Culture (routine x 2)     Status: None   Collection Time: 06/24/22  2:52 PM   Specimen: BLOOD  Result Value Ref Range Status   Specimen Description   Final    BLOOD SITE NOT SPECIFIED Performed at [place] [place] [address], [place], [place] [address]    Special Requests   Final    BOTTLES DRAWN AEROBIC AND ANAEROBIC Blood Culture adequate volume Performed at [facility] [address] [place], [place] [address]    Culture   Final    NO GROWTH 5 DAYS Performed at [facility], [place] [address], [place], [place] [address]    Report Status 06/29/2022 FINAL  Final  Resp Panel by RT-PCR (Flu A&B, Covid) Anterior Nasal Swab     Status: None   Collection Time: 06/24/22  2:58 PM   Specimen: Anterior Nasal Swab  Result Value Ref Range Status   SARS Coronavirus 2 by RT PCR NEGATIVE NEGATIVE Final    Comment: (NOTE) SARS-CoV-2 target nucleic acids are NOT DETECTED.  The SARS-CoV-2 RNA is generally detectable in upper respiratory specimens during the acute phase of infection. The lowest concentration of SARS-CoV-2 viral copies this  assay can detect is 138 copies/mL. A negative result does not preclude SARS-Cov-2 infection and should not be used as the sole basis for treatment or other patient management decisions. A negative result may occur with  improper specimen collection/handling, submission of specimen other than nasopharyngeal swab, presence of viral mutation(s) within the areas targeted by this assay, and inadequate number of viral copies(<138 copies/mL). A negative result must be combined with clinical observations, patient history, and epidemiological information. The expected result is Negative.  Fact Sheet for Patients:  EntrepreneurPulse.com.au  Fact Sheet for Healthcare Providers:  IncredibleEmployment.be  This test is no t yet approved or cleared by the [place] FDA and  has been authorized for detection and/or diagnosis of SARS-CoV-2 by FDA under an Emergency Use Authorization (EUA). This EUA will remain  in effect (meaning this test can be used) for the duration of the COVID-19 declaration under Section 564(b)(1) of the Act, 21 U.S.C.section 360bbb-3(b)(1), unless the authorization is terminated  or revoked sooner.       Influenza A by PCR NEGATIVE NEGATIVE Final   Influenza B by PCR NEGATIVE NEGATIVE Final    Comment: (NOTE) The Xpert Xpress SARS-CoV-2/FLU/RSV plus assay is intended as an aid in the diagnosis of influenza from Nasopharyngeal swab specimens and should not be used as a sole basis for treatment. Nasal washings and aspirates are unacceptable for Xpert Xpress SARS-CoV-2/FLU/RSV testing.  Fact Sheet for Patients: EntrepreneurPulse.com.au  Fact Sheet for Healthcare Providers: IncredibleEmployment.be  This test is not yet approved or cleared by the [place] FDA and has been authorized for detection and/or diagnosis of SARS-CoV-2 by FDA under an Emergency Use Authorization (EUA). This EUA will  remain in effect (meaning this test can be used) for the duration of the COVID-19 declaration under Section 564(b)(1) of the Act, 21 U.S.C. section 360bbb-3(b)(1), unless the authorization is terminated or revoked.  Performed at [facility], [place] [address], [place], [place] [address]   MRSA Next Gen by PCR, Nasal     Status: Abnormal   Collection Time: 06/24/22 10:00 PM   Specimen: Nasal Mucosa; Nasal Swab  Result Value Ref Range Status   MRSA by PCR Next Gen DETECTED (A) NOT DETECTED Final    Comment: CRITICAL RESULT CALLED TO, READ BACK BY AND VERIFIED WITH: Dorise Bullion, RN (NOTE) The GeneXpert MRSA Assay (FDA approved for NASAL specimens only), is one component of a comprehensive MRSA colonization surveillance program. It is not intended to diagnose MRSA infection nor to guide or monitor treatment for MRSA infections. Test performance is not FDA approved in patients less than 91 years old. Performed at [facility], [place] [address] [place], [place] [address]   Blood Culture (routine x 2)     Status: None   Collection Time: 06/24/22 10:02 PM   Specimen: BLOOD LEFT HAND  Result Value Ref Range Status   Specimen Description   Final    BLOOD LEFT HAND Performed at [place] [address] [place], [place] [address]    Special Requests   Final    BOTTLES DRAWN AEROBIC ONLY Blood Culture results may not be optimal due to an inadequate volume of blood received in culture bottles Performed at [facility] [address], [place], [place] [address]    Culture   Final    NO GROWTH 5 DAYS Performed at [facility], [place] [address], [place], [place] [address]    Report Status 06/30/2022 FINAL  Final  Urine Culture     Status: None   Collection Time: 06/26/22  5:29 AM   Specimen: Urine, Catheterized  Result Value Ref Range Status   Specimen Description   Final    URINE,  CATHETERIZED Performed at [place] [address] [place], [place] [address]    Special Requests   Final    NONE Performed at [facility], [place] [address] [place], [place] [address]    Culture   Final  NO GROWTH Performed at [facility], [place] [address], [place], [place] [address]    Report Status 06/27/2022 FINAL  Final    Radiology Studies:  No results found.  Scheduled Meds:    ALPRAZolam  2 mg Oral TID   amoxicillin-clavulanate  1 tablet Oral Q12H   bictegravir-emtricitabine-tenofovir AF  1 tablet Oral QAC breakfast   Chlorhexidine Gluconate Cloth  6 each Topical Q0600   escitalopram  20 mg Oral Daily   feeding supplement  237 mL Oral TID BM   folic acid  1 mg Oral Daily   haloperidol  5 mg Oral Daily   linezolid  600 mg Oral Q12H   lurasidone  120 mg Oral Q breakfast   midodrine  15 mg Oral TID WC   multivitamin with minerals  1 tablet Oral Daily   nystatin   Topical BID   pantoprazole  40 mg Oral QHS   thiamine  100 mg Oral Daily   valACYclovir  1,000 mg Oral Daily    Continuous Infusions:    sodium chloride 10 mL/hr at 06/28/22 0900     LOS: 9 days     Vernell Leep, MD,  FACP, [facility], [facility], [facility] (Care Management Physician Certified) [place]  To contact the attending provider between 7A-7P or the covering provider during after hours 7P-7A, please log into the web site [email or web address] and access using universal [place] password for that web site. If you do not have the password, please call the hospital operator.  07/03/2022, 9:28 AM

## 2022-07-04 ENCOUNTER — Other Ambulatory Visit (HOSPITAL_COMMUNITY): Payer: Self-pay

## 2022-07-04 DIAGNOSIS — B20 Human immunodeficiency virus [HIV] disease: Secondary | ICD-10-CM | POA: Diagnosis not present

## 2022-07-04 DIAGNOSIS — F310 Bipolar disorder, current episode hypomanic: Secondary | ICD-10-CM | POA: Diagnosis not present

## 2022-07-04 DIAGNOSIS — L03314 Cellulitis of groin: Secondary | ICD-10-CM | POA: Diagnosis not present

## 2022-07-04 NOTE — Progress Notes (Signed)
   07/03/22 1716  What Happened  Was fall witnessed? No  Was patient injured? Yes  Patient found on floor  Found by Staff-comment  Stated prior activity other (comment) (Patient reports he can't remember he just got out of the bed.)  Follow Up  MD notified Yes (orders placed)  Time MD notified 1701  Family notified Yes - comment (Brother and Mother)  Time family notified 1720  Additional tests Yes-comment (CT ordered)  Simple treatment  (Vitals neuro check which is skewed due to dZ progression)  Progress note created (see row info)  (No redndant.)  Adult Fall Risk Assessment  Risk Factor Category (scoring not indicated) High fall risk per protocol (document High fall risk)  Age 36  Fall History: Fall within 6 months prior to admission 0  Elimination; Bowel and/or Urine Incontinence 2  Elimination; Bowel and/or Urine Urgency/Frequency 2  Medications: includes PCA/Opiates, Anti-convulsants, Anti-hypertensives, Diuretics, Hypnotics, Laxatives, Sedatives, and Psychotropics 5  Patient Care Equipment 2  Mobility-Assistance 2  Mobility-Gait 2  Mobility-Sensory Deficit 0  Altered awareness of immediate physical environment 0  Impulsiveness 0  Lack of understanding of one's physical/cognitive limitations 4  Total Score 19  Patient Fall Risk Level High fall risk  Adult Fall Risk Interventions  Required Bundle Interventions *See Row Information* High fall risk - low, moderate, and high requirements implemented  Additional Interventions PT/OT need assessed if change in mobility from baseline  Screening for Fall Injury Risk (To be completed on HIGH fall risk patients) - Assessing Need for Floor Mats  Risk For Fall Injury- Criteria for Floor Mats None identified - No additional interventions needed  Pain Assessment  Pain Scale 0-10  Pain Score 0

## 2022-07-04 NOTE — Progress Notes (Signed)
Physical Therapy Treatment Patient Details Name: [name] MRN: [id number] DOB: [birth year] Today's Date: 07/04/2022   History of Present Illness Patient is a 36 year old male who presented to the hosptial after after being found at home by home aide with AMS. patient was admitted with sepsis, emdphysematous cystitis, cellulitis of groin, chronic hidragenitis suppeurative involving groin creases, acute hypoxic respiratory failure, anemia of chronic medical disease. PMH: HIV, bipolar disorder, depression, PTSD, schizoaffective disorder.    PT Comments    RN reports pt just received his medications.  Pt a little groggy however lunch had arrived, and pt agreeable to perform OOB to recliner.  Pt requiring mod assist for controlled descent to recliner.   Pt with poor safety awareness and presents as high fall risk.  Continue to recommend SNF upon d/c.   Recommendations for follow up therapy are one component of a multi-disciplinary discharge planning process, led by the attending physician.  Recommendations may be updated based on patient status, additional functional criteria and insurance authorization.  Follow Up Recommendations  Skilled nursing-short term rehab (<3 hours/day) Can patient physically be transported by private vehicle: Yes   Assistance Recommended at Discharge Frequent or constant Supervision/Assistance  Patient can return home with the following A lot of help with walking and/or transfers;A lot of help with bathing/dressing/bathroom;Assistance with cooking/housework;Assistance with feeding;Direct supervision/assist for medications management;Direct supervision/assist for financial management;Assist for transportation;Help with stairs or ramp for entrance   Equipment Recommendations  None recommended by PT    Recommendations for Other Services       Precautions / Restrictions Precautions Precautions: Fall     Mobility  Bed Mobility Overal bed mobility: Needs  Assistance Bed Mobility: Supine to Sit     Supine to sit: Supervision, HOB elevated     General bed mobility comments: Pt required increased time    Transfers Overall transfer level: Needs assistance Equipment used: Rolling walker (2 wheels) Transfers: Sit to/from Stand, Bed to chair/wheelchair/BSC Sit to Stand: Min guard Stand pivot transfers: Mod assist         General transfer comment: min/guard for safety; pt reports some Lt hip pain with transfer; pt keeping LEs crossed while turning; pt had uncontrolled descent to sitting so therapist assisted; also had recliner right next to bed for safety; pt not able to state why he had uncontrolled descent    Ambulation/Gait                   Stairs             Wheelchair Mobility    Modified Rankin (Stroke Patients Only)       Balance Overall balance assessment: Needs assistance         Standing balance support: Reliant on assistive device for balance, During functional activity, Bilateral upper extremity supported Standing balance-Leahy Scale: Poor                              Cognition Arousal/Alertness: Awake/alert Behavior During Therapy: Flat affect Overall Cognitive Status: No family/caregiver present to determine baseline cognitive functioning                                 General Comments: slow to process, requires increased time, poor safety awareness        Exercises      General Comments  Pertinent Vitals/Pain Pain Assessment Pain Assessment: Faces Faces Pain Scale: Hurts little more Pain Location: buttocks Pain Descriptors / Indicators: Grimacing Pain Intervention(s): Repositioned, Monitored during session    Home Living                          Prior Function            PT Goals (current goals can now be found in the care plan section) Progress towards PT goals: Progressing toward goals    Frequency    Min  3X/week      PT Plan Current plan remains appropriate    Co-evaluation              AM-PAC PT "6 Clicks" Mobility   Outcome Measure  Help needed turning from your back to your side while in a flat bed without using bedrails?: A Little Help needed moving from lying on your back to sitting on the side of a flat bed without using bedrails?: A Little Help needed moving to and from a bed to a chair (including a wheelchair)?: A Little Help needed standing up from a chair using your arms (e.g., wheelchair or bedside chair)?: A Little Help needed to walk in hospital room?: A Little Help needed climbing 3-5 steps with a railing? : A Little 6 Click Score: 18    End of Session Equipment Utilized During Treatment: Gait belt Activity Tolerance: Patient limited by fatigue Patient left: in chair;with call bell/phone within reach;with chair alarm set Nurse Communication: Mobility status PT Visit Diagnosis: Muscle weakness (generalized) (M62.81)     Time: 7591-6384 PT Time Calculation (min) (ACUTE ONLY): 13 min  Charges:  $Therapeutic Activity: 8-22 mins            Jannette Spanner PT, DPT Physical Therapist Acute Rehabilitation Services Preferred contact method: Secure Chat Weekend Pager Only: [phone] Office: [phone]   [name] 07/04/2022, 3:59 PM

## 2022-07-04 NOTE — Progress Notes (Signed)
PROGRESS NOTE   [name]  IHK:[id number]    DOB: 1986-08-23    DOA: 06/24/2022  PCP: [name], MD   I have briefly reviewed patients previous medical [facility].  Chief Complaint  Patient presents with   Altered Mental Status    Brief Narrative:  36 y.o. male with past medical history significant for HIV, bipolar 1 disorder, depression, schizoaffective disorder, PTSD, seizure, chronic buttock wounds, hidradenitis suppurativa who presented to [facility] ED on 9/15 via EMS after he was found altered at home by his home health nurse.  Patient apparently lives alone and is nonambulatory but has a home health nurse who assists him during the day.  Due to patient's mental status, history obtained from EMS/ED provider reports as well as mother.  Mother reported that he drinks approximately 4-5 40 ounce beers a day and has been having some increased wounds/skin breakdown to groin as well as nausea/vomiting and diarrhea.  It is reported that he typically sits in his body wastes until being seen by his home health nurses; and he typically has been drinking alcohol and not taking in anything else.  Admitted for sepsis, POA due to emphysematous cystitis, left pyelitis, cellulitis of the groin and chronic hidradenitis suppurative of multiple regions along with acute respiratory failure with hypoxia.  Clinically improved and medically optimized for DC to next level of care/SNF agreeable but according to [facility], may be difficult to place given prior history of agreeing to go to SNF and refusing at the last minute.  Remains medically optimized for DC to SNF pending bed.  Appears to be difficult to place.  Weekday TOC to follow-up tomorrow.   Assessment & Plan:  Principal Problem:   Sepsis ([place]) Active Problems:   Protein-calorie malnutrition, severe (HCC)   Schizoaffective disorder, depressive type (HCC)   GERD (gastroesophageal reflux disease)   Cellulitis of groin   HIV disease  (HCC)   Thrombocytopenia (HCC)   Anemia   Acute metabolic encephalopathy   Chronic alcohol use   Bipolar disorder (HCC)   AKI (acute kidney injury) ([place])   Emphysematous cystitis   Sepsis, POA Emphysematous cystitis/left pyelitis Cellulitis groin Chronic hidradenitis suppurativa involving groin creases, pubic region, buttocks Patient presenting to the ED via EMS after being found by home health nurse altered while lying in his own excrement.  On arrival to the ED, patient was hypothermic, elevated WBC count of 15.1 with procalcitonin 0.60, lactic acid 2.0.  MRSA PCR positive.  CT abdomen/pelvis notable for emphysematous cystitis/pyelitis and irregular dermal thickening of the gluteal soft tissues consistent with cellulitis.  Urology was consulted, who recommended broad-spectrum antibiotics, Foley catheter placed to allow for maximal decompression with no plans for left ureteral stent placement given he has no hydronephrosis and this is likely an ascending infection.  Repeat CT abdomen/pelvis 9/18 shows resolution of gas to left renal collecting system, no gas noted within bladder. -- WBC presented with WBC of 15 which had improved and then gradually went up to 20.  Leukocytosis has almost resolved, down to 11. -- Lactic acid 2.0>0.6 -- Blood cultures x2: No growth, final. -- Urine culture: No growth -- De-escalated vancomycin/cefepime/metronidazole to Zyvox/Augmentin on 9/19, to complete 2 week course per urology -- Supportive care -- Foley catheter discontinued 9/19 and appears to be voiding without difficulty. -- Improved.   Acute hypoxic respiratory failure, not POA Patient overnight 9/18 developed oxygen requirement, up to 5 L nasal cannula.  Lung sounds rhonchorous with chest  x-ray with mild congestion versus infiltrates.  S/p a dose of IV Lasix and hypoxia has resolved.   Anemia of chronic medical disease Anemia panel with iron 50, TIBC low at 106, ferritin high at 386, folate 11.2,  vitamin B12 452; all consistent with anemia of chronic disease. -- Hgb presented with hemoglobin of 8.5 which dropped to a low of 6.1.  S/p transfusion.  Hemoglobin stable over the last several days.  Will minimize lab draws since there is no acute indication at this time.   Bipolar 1 disorder Schizoaffective disorder PTSD Depression/anxiety -- Lurasidone 120 mg p.o. daily -- Haldol 5 mg p.o. daily -- Lexapro 20 mg p.o. daily -- Hold home Xanax for now    Orthostatic hypotension/autonomic dysfunction -- Increased Midodrine to 15 mg p.o. 3 times daily AC   Hx HIV Follows with infectious disease outpatient, [facility].  Per review of EMR, notes state he is maintained on [place]; but although not listed on home medication list -- Continue Biktarvy -- Outpatient follow-up with infectious disease   EtOH use disorder EtOH level less than 10 on admission.  Counseled on need for complete cessation  Dysphagia: SLP input appreciated.  Have recommended nectar thick liquid, regular diet with free water protocol after oral care.  Hypokalemia: Replaced  Thrombocytopenia: Resolved.  Fall off the bed 9/24: Patient's RN reported last evening that patient was noted to be on the floor and had a small scratch over the lateral aspect of right eyebrow.  Vital signs were stable.  Ordered fall precautions, neurochecks and obtain CT of the head which showed no acute findings.  No other injuries reported.  Patient's RN updated mother and brother yesterday following the event.  Body mass index is 22.35 kg/m.  Nutritional Status Nutrition Problem: Increased nutrient needs Etiology: chronic illness Signs/Symptoms: estimated needs Interventions: Ensure Enlive (each supplement provides 350kcal and 20 grams of protein), MVI  Chronic inflammatory wounds: As per [facility] RN input 9/16 (see details in their note), diffuse area affected on the bilateral buttocks, medial and posterior thighs with  scarring and pinpoint open areas, some with yellow drainage, others without.  Axilla with scarring consistent with at bedtime, no draining wounds.  Based on their evaluation, pressure injury [name].  Thereby pressure injury ruled out.  Improving.    DVT prophylaxis: SCDs Start: 06/24/22 2131     Code Status: DNR:  Family Communication: None at bedside. Disposition:  Status is: Inpatient Patient is medically optimized for DC to SNF pending SLP evaluation, SNF bed and insurance.  Per communication with TOC, "has no bed offers, many previous placement attempts and he usually declines to go on the day of DC".   Consultants:   Urology  Procedures:   Foley catheter discontinued 9/19  Antimicrobials:   As above   Subjective:  Patient seen along with his nurse.  Patient denies complaints.  No pain reported.  On asking how he fell off the bed, he states that he was trying to reach out for his medications which fell on the floor and while attempting to get that, he fell.  Denies pain or any other injuries.  Advised him that he is not to attempt to try to get out of bed.  Objective:   Vitals:   07/03/22 1654 07/03/22 2107 07/04/22 0456 07/04/22 1351  BP: (!) 132/90 113/76 (!) 133/92 123/71  Pulse: 79 63 63 64  Resp:  16 18 18   Temp: 98.9 F (37.2 C) 98.6 F (37  C) 98.5 F (36.9 C) 97.9 F (36.6 C)  TempSrc: Oral Oral Oral Oral  SpO2: 100% 98% 98% 100%  Weight:      Height:         General exam: Young male, moderately built and nourished comfortably sleeping in bed.  Chronically ill looking. Respiratory system: Clear to auscultation.  No increased work of breathing. Cardiovascular system: S1 & S2 heard, RRR. No JVD, murmurs, rubs, gallops or clicks. No pedal edema.   Gastrointestinal system: Abdomen is nondistended, soft and nontender. No organomegaly or masses felt. Normal bowel sounds heard. Central nervous system: Alert and oriented. No focal neurological deficits. Extremities:  Symmetric 5 x 5 power. Skin: Examined with patient's RN, took down dressings over his bottom, has multiple seizure-like pinhole openings on his buttocks, posterior upper thighs but no active drainage or acute findings.  Tiny cut over the lateral aspect of the right eyebrow without active bleeding or acute findings. Psychiatry: Judgement and insight appear at least somewhat impaired. Mood & affect appropriate.  ENT: Possible left upper molar caries.  Advised outpatient dentist follow-up.    Data Reviewed:   I have personally reviewed following labs and imaging studies   CBC: Recent Labs  Lab 06/29/22 0353 06/30/22 0440 07/01/22 0431  WBC 20.0* 16.5* 11.0*  HGB 9.1* 8.8* 9.2*  HCT 28.5* 28.8* 30.0*  MCV 98.3 99.7 100.7*  PLT 120* 148* 384    Basic Metabolic Panel: Recent Labs  Lab 06/28/22 0307 06/29/22 0353 06/30/22 0440 07/01/22 0431  NA 140 142 141 142  K 3.8 3.5 3.3* 4.0  CL 111 109 111 110  CO2 25 27 26 29   GLUCOSE 95 100* 118* 102*  BUN 12 14 16 15   CREATININE 1.19 1.00 1.11 1.01  CALCIUM 8.0* 8.2* 8.4* 8.4*  MG  --   --   --  1.9    Liver Function Tests: Recent Labs  Lab 07/01/22 0431  AST 13*  ALT 9  ALKPHOS 101  BILITOT 0.3  PROT 6.4*  ALBUMIN 1.7*    CBG: No results for input(s): "GLUCAP" in the last 168 hours.   Microbiology Studies:   Recent Results (from the past 240 hour(s))  Blood Culture (routine x 2)     Status: None   Collection Time: 06/24/22  2:52 PM   Specimen: BLOOD  Result Value Ref Range Status   Specimen Description   Final    BLOOD SITE NOT SPECIFIED Performed at [facility] [address] [place], [place] [address]    Special Requests   Final    BOTTLES DRAWN AEROBIC AND ANAEROBIC Blood Culture adequate volume Performed at [facility] [address] [place], [place] [address]    Culture   Final    NO GROWTH 5 DAYS Performed at [facility], [place] [address],  [place], [place] [address]    Report Status 06/29/2022 FINAL  Final  Resp Panel by RT-PCR (Flu A&B, Covid) Anterior Nasal Swab     Status: None   Collection Time: 06/24/22  2:58 PM   Specimen: Anterior Nasal Swab  Result Value Ref Range Status   SARS Coronavirus 2 by RT PCR NEGATIVE NEGATIVE Final    Comment: (NOTE) SARS-CoV-2 target nucleic acids are NOT DETECTED.  The SARS-CoV-2 RNA is generally detectable in upper respiratory specimens during the acute phase of infection. The lowest concentration of SARS-CoV-2 viral copies this assay can detect is 138 copies/mL. A negative result does not  preclude SARS-Cov-2 infection and should not be used as the sole basis for treatment or other patient management decisions. A negative result may occur with  improper specimen collection/handling, submission of specimen other than nasopharyngeal swab, presence of viral mutation(s) within the areas targeted by this assay, and inadequate number of viral copies(<138 copies/mL). A negative result must be combined with clinical observations, patient history, and epidemiological information. The expected result is Negative.  Fact Sheet for Patients:  EntrepreneurPulse.com.au  Fact Sheet for Healthcare Providers:  IncredibleEmployment.be  This test is no t yet approved or cleared by the [place] FDA and  has been authorized for detection and/or diagnosis of SARS-CoV-2 by FDA under an Emergency Use Authorization (EUA). This EUA will remain  in effect (meaning this test can be used) for the duration of the COVID-19 declaration under Section 564(b)(1) of the Act, 21 U.S.C.section 360bbb-3(b)(1), unless the authorization is terminated  or revoked sooner.       Influenza A by PCR NEGATIVE NEGATIVE Final   Influenza B by PCR NEGATIVE NEGATIVE Final    Comment: (NOTE) The Xpert Xpress SARS-CoV-2/FLU/RSV plus assay is intended as an aid in the diagnosis of influenza  from Nasopharyngeal swab specimens and should not be used as a sole basis for treatment. Nasal washings and aspirates are unacceptable for Xpert Xpress SARS-CoV-2/FLU/RSV testing.  Fact Sheet for Patients: EntrepreneurPulse.com.au  Fact Sheet for Healthcare Providers: IncredibleEmployment.be  This test is not yet approved or cleared by the [place] FDA and has been authorized for detection and/or diagnosis of SARS-CoV-2 by FDA under an Emergency Use Authorization (EUA). This EUA will remain in effect (meaning this test can be used) for the duration of the COVID-19 declaration under Section 564(b)(1) of the Act, 21 U.S.C. section 360bbb-3(b)(1), unless the authorization is terminated or revoked.  Performed at [facility], [place] [address], [place], [place] [address]   MRSA Next Gen by PCR, Nasal     Status: Abnormal   Collection Time: 06/24/22 10:00 PM   Specimen: Nasal Mucosa; Nasal Swab  Result Value Ref Range Status   MRSA by PCR Next Gen DETECTED (A) NOT DETECTED Final    Comment: CRITICAL RESULT CALLED TO, READ BACK BY AND VERIFIED WITH: Dorise Bullion, RN (NOTE) The GeneXpert MRSA Assay (FDA approved for NASAL specimens only), is one component of a comprehensive MRSA colonization surveillance program. It is not intended to diagnose MRSA infection nor to guide or monitor treatment for MRSA infections. Test performance is not FDA approved in patients less than 46 years old. Performed at [facility], [place] [address], [place], [place] [address]   Blood Culture (routine x 2)     Status: None   Collection Time: 06/24/22 10:02 PM   Specimen: BLOOD LEFT HAND  Result Value Ref Range Status   Specimen Description   Final    BLOOD LEFT HAND Performed at [facility] [address], [place], [place] [address]    Special Requests   Final    BOTTLES DRAWN AEROBIC ONLY Blood Culture  results may not be optimal due to an inadequate volume of blood received in culture bottles Performed at [facility] [address], [place], [place] [address]    Culture   Final    NO GROWTH 5 DAYS Performed at [facility], [place] [address], [place], [place] [address]    Report Status 06/30/2022 FINAL  Final  Urine Culture  Status: None   Collection Time: 06/26/22  5:29 AM   Specimen: Urine, Catheterized  Result Value Ref Range Status   Specimen Description   Final    URINE, CATHETERIZED Performed at [facility] [address], [place], [place] [address]    Special Requests   Final    NONE Performed at [facility], [place] [address] [place], [place] [address]    Culture   Final    NO GROWTH Performed at [facility], [place] [address] [place], [place] [address]    Report Status 06/27/2022 FINAL  Final    Radiology Studies:  CT HEAD WO CONTRAST (5MM)  Result Date: 07/03/2022 CLINICAL DATA:  Trauma, fall EXAM: CT HEAD WITHOUT CONTRAST TECHNIQUE: Contiguous axial images were obtained from the base of the skull through the vertex without intravenous contrast. RADIATION DOSE REDUCTION: This exam was performed according to the departmental dose-optimization program which includes automated exposure control, adjustment of the mA and/or kV according to patient size and/or use of iterative reconstruction technique. COMPARISON:  06/24/2022 FINDINGS: Brain: No acute intracranial findings are seen. There are no signs of bleeding within the cranium. Calcifications are seen in basal ganglia. Ventricles are not dilated. Cortical sulci are prominent. Vascular: Unremarkable. Skull: No fracture is seen. Sinuses/Orbits: There are no air-fluid levels in paranasal sinuses. No focal abnormalities are seen in orbits. There is subcutaneous edema/contusion in the right periorbital region. Other: None. IMPRESSION: No acute intracranial  findings are seen in noncontrast CT brain. Electronically Signed   By: Elmer Picker M.D.   On: 07/03/2022 19:44    Scheduled Meds:    ALPRAZolam  2 mg Oral TID   amoxicillin-clavulanate  1 tablet Oral Q12H   bictegravir-emtricitabine-tenofovir AF  1 tablet Oral QAC breakfast   Chlorhexidine Gluconate Cloth  6 each Topical Q0600   escitalopram  20 mg Oral Daily   feeding supplement  237 mL Oral TID BM   folic acid  1 mg Oral Daily   haloperidol  5 mg Oral Daily   linezolid  600 mg Oral Q12H   lurasidone  120 mg Oral Q breakfast   midodrine  15 mg Oral TID WC   multivitamin with minerals  1 tablet Oral Daily   nystatin   Topical BID   pantoprazole  40 mg Oral QHS   thiamine  100 mg Oral Daily   valACYclovir  1,000 mg Oral Daily    Continuous Infusions:    sodium chloride 10 mL/hr at 06/28/22 0900     LOS: 10 days     Vernell Leep, MD,  FACP, [facility], [facility], [facility] (Care Management Physician Certified) [place]  To contact the attending provider between 7A-7P or the covering provider during after hours 7P-7A, please log into the web site [email or web address] and access using universal [place] password for that web site. If you do not have the password, please call the hospital operator.  07/04/2022, 2:18 PM

## 2022-07-05 DIAGNOSIS — R9431 Abnormal electrocardiogram [ECG] [EKG]: Secondary | ICD-10-CM

## 2022-07-05 DIAGNOSIS — B20 Human immunodeficiency virus [HIV] disease: Secondary | ICD-10-CM | POA: Diagnosis not present

## 2022-07-05 DIAGNOSIS — F310 Bipolar disorder, current episode hypomanic: Secondary | ICD-10-CM | POA: Diagnosis not present

## 2022-07-05 DIAGNOSIS — L03314 Cellulitis of groin: Secondary | ICD-10-CM | POA: Diagnosis not present

## 2022-07-05 LAB — COMPREHENSIVE METABOLIC PANEL
ALT: 11 U/L (ref 0–44)
AST: 15 U/L (ref 15–41)
Albumin: 1.9 g/dL — ABNORMAL LOW (ref 3.5–5.0)
Alkaline Phosphatase: 113 U/L (ref 38–126)
Anion gap: 4 — ABNORMAL LOW (ref 5–15)
BUN: 12 mg/dL (ref 6–20)
CO2: 30 mmol/L (ref 22–32)
Calcium: 8.7 mg/dL — ABNORMAL LOW (ref 8.9–10.3)
Chloride: 105 mmol/L (ref 98–111)
Creatinine, Ser: 1.25 mg/dL — ABNORMAL HIGH (ref 0.61–1.24)
GFR, Estimated: >60 mL/min (ref >60)
Glucose, Bld: 85 mg/dL (ref 70–99)
Potassium: 4.3 mmol/L (ref 3.5–5.1)
Sodium: 139 mmol/L (ref 135–145)
Total Bilirubin: 0.5 mg/dL (ref 0.3–1.2)
Total Protein: 7.1 g/dL (ref 6.5–8.1)

## 2022-07-05 LAB — CBC
HCT: 30.8 % — ABNORMAL LOW (ref 39.0–52.0)
Hemoglobin: 9.5 g/dL — ABNORMAL LOW (ref 13.0–17.0)
MCH: 31 pg (ref 26.0–34.0)
MCHC: 30.8 g/dL (ref 30.0–36.0)
MCV: 100.7 fL — ABNORMAL HIGH (ref 80.0–100.0)
Platelets: 226 10*3/uL (ref 150–400)
RBC: 3.06 MIL/uL — ABNORMAL LOW (ref 4.22–5.81)
RDW: 17.3 % — ABNORMAL HIGH (ref 11.5–15.5)
WBC: 10.6 10*3/uL — ABNORMAL HIGH (ref 4.0–10.5)
nRBC: 0 % (ref 0.0–0.2)

## 2022-07-05 LAB — GLUCOSE, CAPILLARY
Glucose-Capillary: 52 mg/dL — ABNORMAL LOW (ref 70–99)
Glucose-Capillary: 95 mg/dL (ref 70–99)

## 2022-07-05 LAB — MAGNESIUM: Magnesium: 2 mg/dL (ref 1.7–2.4)

## 2022-07-05 NOTE — Progress Notes (Addendum)
PROGRESS NOTE   [name]  SUP:[id number]    DOB: 1986/07/23    DOA: 06/24/2022  PCP: [name], MD   I have briefly reviewed patients previous medical records in [facility].  Chief Complaint  Patient presents with   Altered Mental Status    Brief Narrative:  36 y.o. male with past medical history significant for HIV, bipolar 1 disorder, depression, schizoaffective disorder, PTSD, seizure, chronic buttock wounds, hidradenitis suppurativa who presented to [facility] ED on 9/15 via EMS after he was found altered at home by his home health nurse.  Patient apparently lives alone and is nonambulatory but has a home health nurse who assists him during the day.  Due to patient's mental status, history obtained from EMS/ED provider reports as well as mother.  Mother reported that he drinks approximately 4-5 40 ounce beers a day and has been having some increased wounds/skin breakdown to groin as well as nausea/vomiting and diarrhea.  It is reported that he typically sits in his body wastes until being seen by his home health nurses; and he typically has been drinking alcohol and not taking in anything else.  Admitted for sepsis, POA due to emphysematous cystitis, left pyelitis, cellulitis of the groin and chronic hidradenitis suppurative of multiple regions along with acute respiratory failure with hypoxia.    Remains medically optimized for DC to SNF pending bed.  Appears to be difficult to place.  Has been ready for DC since last week.   Assessment & Plan:  Principal Problem:   Sepsis ([place]) Active Problems:   Protein-calorie malnutrition, severe (HCC)   Schizoaffective disorder, depressive type (HCC)   GERD (gastroesophageal reflux disease)   Cellulitis of groin   HIV disease (HCC)   Thrombocytopenia (HCC)   Anemia   Acute metabolic encephalopathy   Chronic alcohol use   Bipolar disorder (HCC)   AKI (acute kidney injury) ([place])   Emphysematous cystitis   Sepsis,  POA Emphysematous cystitis/left pyelitis Cellulitis groin Chronic hidradenitis suppurativa involving groin creases, pubic region, buttocks Patient presenting to the ED via EMS after being found by home health nurse altered while lying in his own excrement.  On arrival to the ED, patient was hypothermic, elevated WBC count of 15.1 with procalcitonin 0.60, lactic acid 2.0.  MRSA PCR positive.  CT abdomen/pelvis notable for emphysematous cystitis/pyelitis and irregular dermal thickening of the gluteal soft tissues consistent with cellulitis.  Urology was consulted, who recommended broad-spectrum antibiotics, Foley catheter placed to allow for maximal decompression with no plans for left ureteral stent placement given he has no hydronephrosis and this is likely an ascending infection.  Repeat CT abdomen/pelvis 9/18 shows resolution of gas to left renal collecting system, no gas noted within bladder.  He currently has external ureteral catheter. -- Leukocytosis and elevated lactate resolved.  Blood and urine cultures negative -- De-escalated vancomycin/cefepime/metronidazole to Zyvox/Augmentin on 9/19, to complete 2 week course per urology -- Supportive care -- Foley catheter discontinued 9/19 and appears to be voiding without difficulty. -- Improved.   Acute hypoxic respiratory failure, not POA Patient overnight 9/18 developed oxygen requirement, up to 5 L nasal cannula.  Lung sounds rhonchorous with chest x-ray with mild congestion versus infiltrates.  S/p a dose of IV Lasix and hypoxia has resolved.   Anemia of chronic medical disease Anemia panel with iron 50, TIBC low at 106, ferritin high at 386, folate 11.2, vitamin B12 452; all consistent with anemia of chronic disease. -- Hgb presented with hemoglobin of  8.5 which dropped to a low of 6.1.  S/p transfusion.  Hemoglobin stable in the 9 g range.   Bipolar 1 disorder Schizoaffective disorder PTSD Depression/anxiety -- Lurasidone 120 mg p.o.  daily -- Haldol 5 mg p.o. daily -- Lexapro 20 mg p.o. daily -- Hold home Xanax for now    Orthostatic hypotension/autonomic dysfunction -- Increased Midodrine to 15 mg p.o. 3 times daily AC   Hx HIV Follows with infectious disease outpatient, [facility].  Per review of EMR, notes state he is maintained on [place]; but although not listed on home medication list -- Continue Biktarvy -- Outpatient follow-up with infectious disease   EtOH use disorder EtOH level less than 10 on admission.  Counseled on need for complete cessation  Dysphagia: SLP input appreciated.  Have recommended nectar thick liquid, regular diet with free water protocol after oral care.  Hypokalemia: Replaced  Thrombocytopenia: Resolved.  Fall off the bed 9/24: Patient's RN reported last evening that patient was noted to be on the floor and had a small scratch over the lateral aspect of right eyebrow.  Vital signs were stable.  Ordered fall precautions, neurochecks and obtain CT of the head which showed no acute findings.  No other injuries reported.  Patient's RN updated mother and brother yesterday following the event.  Prolonged QTc: Had not appreciated his EKG from 9/19 until today, QTc: 643 ms.  We will repeat EKG stat.  Patient on polypharmacy that can worsen QTc and may need adjustments.  Potassium is 4.3.  We will check an ensure magnesium >2.  Magnesium 2.  Repeat EKG showed QTc of 460 ms.  Follow EKG periodically.  Renal insufficiency: Creatinine has bumped up slightly from 1.01-1.25.  Not high enough to call AKI.  Encourage oral fluid intake.  Follow BMP in AM.  Hypoalbuminemia: Albumin 1.9.  Management per RD.  Dysphagia: Diet as per SLP recommendations: Nectar thick liquids, regular  Body mass index is 22.35 kg/m.  Nutritional Status Nutrition Problem: Increased nutrient needs Etiology: chronic illness Signs/Symptoms: estimated needs Interventions: Ensure Enlive (each  supplement provides 350kcal and 20 grams of protein), MVI  Chronic inflammatory wounds: As per [facility] RN input 9/16 (see details in their note), diffuse area affected on the bilateral buttocks, medial and posterior thighs with scarring and pinpoint open areas, some with yellow drainage, others without.  Axilla with scarring consistent with at bedtime, no draining wounds.  Based on their evaluation, pressure injury [name].  Thereby pressure injury ruled out.  Improving.    DVT prophylaxis: SCDs Start: 06/24/22 2131     Code Status: DNR:  Family Communication: None at bedside. Disposition:  Status is: Inpatient Patient is medically optimized for DC to SNF pending SLP evaluation, SNF bed and insurance.  Per communication with TOC, "has no bed offers, many previous placement attempts and he usually declines to go on the day of DC".   Consultants:   Urology  Procedures:   Foley catheter discontinued 9/19  Antimicrobials:   As above   Subjective:  No complaints reported.  Objective:   Vitals:   07/04/22 0456 07/04/22 1351 07/04/22 2028 07/05/22 0439  BP: (!) 133/92 123/71 115/77 125/79  Pulse: 63 64 69 (!) 58  Resp: 18 18 17 16   Temp: 98.5 F (36.9 C) 97.9 F (36.6 C) 97.9 F (36.6 C) 97.9 F (36.6 C)  TempSrc: Oral Oral Oral Oral  SpO2: 98% 100% 100% 100%  Weight:      Height:  General exam: Young male, moderately built and nourished comfortably sleeping in bed.  Chronically ill looking. Respiratory system: Clear to auscultation.  No increased work of breathing. Cardiovascular system: S1 & S2 heard, RRR. No JVD, murmurs, rubs, gallops or clicks. No pedal edema.   Gastrointestinal system: Abdomen is nondistended, soft and nontender. No organomegaly or masses felt. Normal bowel sounds heard. Central nervous system: Alert and oriented. No focal neurological deficits. Extremities: Symmetric 5 x 5 power. Skin: has multiple sieve-like pinhole openings on his buttocks,  posterior upper thighs but no active drainage or acute findings.  Tiny cut over the lateral aspect of the right eyebrow without active bleeding or acute findings. Psychiatry: Judgement and insight appear at least somewhat impaired. Mood & affect appropriate.  ENT: Possible left upper molar caries.  Advised outpatient dentist follow-up.    Data Reviewed:   I have personally reviewed following labs and imaging studies   CBC: Recent Labs  Lab 06/30/22 0440 07/01/22 0431 07/05/22 0431  WBC 16.5* 11.0* 10.6*  HGB 8.8* 9.2* 9.5*  HCT 28.8* 30.0* 30.8*  MCV 99.7 100.7* 100.7*  PLT 148* 153 263    Basic Metabolic Panel: Recent Labs  Lab 06/29/22 0353 06/30/22 0440 07/01/22 0431 07/05/22 0431  NA 142 141 142 139  K 3.5 3.3* 4.0 4.3  CL 109 111 110 105  CO2 27 26 29 30   GLUCOSE 100* 118* 102* 85  BUN 14 16 15 12   CREATININE 1.00 1.11 1.01 1.25*  CALCIUM 8.2* 8.4* 8.4* 8.7*  MG  --   --  1.9  --     Liver Function Tests: Recent Labs  Lab 07/01/22 0431 07/05/22 0431  AST 13* 15  ALT 9 11  ALKPHOS 101 113  BILITOT 0.3 0.5  PROT 6.4* 7.1  ALBUMIN 1.7* 1.9*    CBG: No results for input(s): "GLUCAP" in the last 168 hours.   Microbiology Studies:   Recent Results (from the past 240 hour(s))  Urine Culture     Status: None   Collection Time: 06/26/22  5:29 AM   Specimen: Urine, Catheterized  Result Value Ref Range Status   Specimen Description   Final    URINE, CATHETERIZED Performed at [facility] [address], [place], [place] [address]    Special Requests   Final    NONE Performed at [facility], [place] [address] [place], [place] [address]    Culture   Final    NO GROWTH Performed at [facility], [place] [address], [place], [place] [address]    Report Status 06/27/2022 FINAL  Final    Radiology Studies:  CT HEAD WO CONTRAST (5MM)  Result Date: 07/03/2022 CLINICAL DATA:  Trauma, fall EXAM: CT HEAD WITHOUT  CONTRAST TECHNIQUE: Contiguous axial images were obtained from the base of the skull through the vertex without intravenous contrast. RADIATION DOSE REDUCTION: This exam was performed according to the departmental dose-optimization program which includes automated exposure control, adjustment of the mA and/or kV according to patient size and/or use of iterative reconstruction technique. COMPARISON:  06/24/2022 FINDINGS: Brain: No acute intracranial findings are seen. There are no signs of bleeding within the cranium. Calcifications are seen in basal ganglia. Ventricles are not dilated. Cortical sulci are prominent. Vascular: Unremarkable. Skull: No fracture is seen. Sinuses/Orbits: There are no air-fluid levels in paranasal sinuses. No focal abnormalities are seen in orbits. There is subcutaneous edema/contusion in the right periorbital region. Other: None. IMPRESSION: No acute intracranial  findings are seen in noncontrast CT brain. Electronically Signed   By: Elmer Picker M.D.   On: 07/03/2022 19:44    Scheduled Meds:    ALPRAZolam  2 mg Oral TID   amoxicillin-clavulanate  1 tablet Oral Q12H   bictegravir-emtricitabine-tenofovir AF  1 tablet Oral QAC breakfast   Chlorhexidine Gluconate Cloth  6 each Topical Q0600   escitalopram  20 mg Oral Daily   feeding supplement  237 mL Oral TID BM   folic acid  1 mg Oral Daily   haloperidol  5 mg Oral Daily   linezolid  600 mg Oral Q12H   lurasidone  120 mg Oral Q breakfast   midodrine  15 mg Oral TID WC   multivitamin with minerals  1 tablet Oral Daily   nystatin   Topical BID   pantoprazole  40 mg Oral QHS   thiamine  100 mg Oral Daily   valACYclovir  1,000 mg Oral Daily    Continuous Infusions:    sodium chloride 10 mL/hr at 06/28/22 0900     LOS: 11 days     Vernell Leep, MD,  FACP, [facility], [facility], [facility] (Care Management Physician Certified) [place]  To contact the attending provider between  7A-7P or the covering provider during after hours 7P-7A, please log into the web site [email or web address] and access using universal [place] password for that web site. If you do not have the password, please call the hospital operator.  07/05/2022, 9:29 AM

## 2022-07-05 NOTE — Progress Notes (Signed)
Occupational Therapy Treatment Patient Details Name: [name] MRN: [id number] DOB: 1986-07-16 Today's Date: 07/05/2022   History of present illness Patient is a 36 year old male who presented to the hosptial after after being found at home by home aide with AMS. patient was admitted with sepsis, emdphysematous cystitis, cellulitis of groin, chronic hidragenitis suppeurative involving groin creases, acute hypoxic respiratory failure, anemia of chronic medical disease. PMH: HIV, bipolar disorder, depression, PTSD, schizoaffective disorder.   OT comments  Patient was motivated to participate in therapy & requested to transfer to the bedside chair.  He performed upper body dressing, sit to stand using a RW, and a stand-pivot transfer to the chair. He was required occasional cues for safety awareness during out of bed tasks. He will continue to benefit from further OT services to maximize his safety and independence with ADLs. At current, he is at risk for falls and restricted ADL participation.    Recommendations for follow up therapy are one component of a multi-disciplinary discharge planning process, led by the attending physician.  Recommendations may be updated based on patient status, additional functional criteria and insurance authorization.    Follow Up Recommendations  Skilled nursing-short term rehab (<3 hours/day)       Patient can return home with the following  A lot of help with bathing/dressing/bathroom;Assist for transportation;Assistance with cooking/housework;Help with stairs or ramp for entrance;A little help with walking and/or transfers         Precautions / Restrictions Precautions Precautions: Fall Restrictions Weight Bearing Restrictions: No       Mobility Bed Mobility Overal bed mobility: Needs Assistance Bed Mobility: Supine to Sit     Supine to sit: Supervision, HOB elevated          Transfers Overall transfer level: Needs assistance Equipment  used: Rolling walker (2 wheels) Transfers: Sit to/from Stand, Bed to chair/wheelchair/BSC Sit to Stand: Min guard Stand pivot transfers: Min assist, From elevated surface         General transfer comment: He required moderate cues for transfer technique, in order to stand-pivot to the bedside chair using a rolling walker         ADL either performed or assessed with clinical judgement   ADL   Eating/Feeding: Sitting;Independent   Grooming: Sitting;Set up           Upper Body Dressing : Minimal assistance Upper Body Dressing Details (indicate cue type and reason): He donned a hospital gown around his back seated EOB Lower Body Dressing: Maximal assistance                                 Cognition Arousal/Alertness: Awake/alert Behavior During Therapy: Flat affect             General Comments: slightly delayed cognitive processing, able to follow 1 step commands consistently                   Pertinent Vitals/ Pain       Pain Assessment Pain Assessment: No/denies pain         Frequency  Min 2X/week        Progress Toward Goals  OT Goals(current goals can now be found in the care plan section)  Progress towards OT goals: Progressing toward goals  Acute Rehab OT Goals OT Goal Formulation: With patient Time For Goal Achievement: 07/12/22 Potential to Achieve Goals: Good  Plan Discharge plan remains appropriate  AM-PAC OT "6 Clicks" Daily Activity     Outcome Measure   Help from another person eating meals?: None Help from another person taking care of personal grooming?: None Help from another person toileting, which includes using toliet, bedpan, or urinal?: A Lot Help from another person bathing (including washing, rinsing, drying)?: A Lot Help from another person to put on and taking off regular upper body clothing?: A Little Help from another person to put on and taking off regular lower body clothing?: A Lot 6 Click Score:  17    End of Session Equipment Utilized During Treatment: Rolling walker (2 wheels);Gait belt  OT Visit Diagnosis: Unsteadiness on feet (R26.81);Muscle weakness (generalized) (M62.81)   Activity Tolerance Patient tolerated treatment well   Patient Left in chair;with call bell/phone within reach;with chair alarm set   Nurse Communication Mobility status        Time: 4373-5789 OT Time Calculation (min): 11 min  Charges: OT General Charges $OT Visit: 1 Visit OT Treatments $Therapeutic Activity: 8-22 mins    Leota Sauers, OTR/L 07/05/2022, 1:10 PM

## 2022-07-06 DIAGNOSIS — A419 Sepsis, unspecified organism: Secondary | ICD-10-CM | POA: Diagnosis not present

## 2022-07-06 DIAGNOSIS — G9341 Metabolic encephalopathy: Secondary | ICD-10-CM | POA: Diagnosis not present

## 2022-07-06 DIAGNOSIS — F1090 Alcohol use, unspecified, uncomplicated: Secondary | ICD-10-CM | POA: Diagnosis not present

## 2022-07-06 LAB — BASIC METABOLIC PANEL
Anion gap: 6 (ref 5–15)
BUN: 12 mg/dL (ref 6–20)
CO2: 30 mmol/L (ref 22–32)
Calcium: 9.2 mg/dL (ref 8.9–10.3)
Chloride: 104 mmol/L (ref 98–111)
Creatinine, Ser: 1.22 mg/dL (ref 0.61–1.24)
GFR, Estimated: >60 mL/min (ref >60)
Glucose, Bld: 78 mg/dL (ref 70–99)
Potassium: 4.4 mmol/L (ref 3.5–5.1)
Sodium: 140 mmol/L (ref 135–145)

## 2022-07-06 NOTE — TOC Progression Note (Addendum)
Transition of Care [facility]) - Progression Note    Patient Details  Name: [name] MRN: [id number] Date of Birth: 08-06-1986  Transition of Care [facility]) CM/SW [place], RN Phone Number: 07/06/2022, 9:37 AM  Clinical Narrative:  [facility] faxed out, patient does not have any bed offers.  TOC will continue to follow.  -10:52am Spoke with patient at bedside to advise no bed offers at this time. Patient agreeable to going back home reports he has help at home. Patient will need transportation at discharge. Pharmacy: [facility] This RNCM reached out to [facility] to confirm resumption of [facility] services.  TOC will continue to follow.  - 11:26am [facility] is following the patient for HHRN, HHPT. Will need HHPT, HHRN orders at discharge.   TOC will continue to follow.   Expected Discharge Plan: [place] Barriers to Discharge: Continued Medical Work up  Expected Discharge Plan and Services Expected Discharge Plan: [place] In-house Referral: NA   Post Acute Care Choice: Durable Medical Equipment, Home Health Living arrangements for the past 2 months: Apartment                                       Social Determinants of Health (SDOH) Interventions    Readmission Risk Interventions    02/15/2022    2:35 PM 08/24/2021    4:00 PM 08/24/2021   11:54 AM  Readmission Risk Prevention Plan  Transportation Screening Complete Complete   PCP or Specialist Appt within 3-5 Days  Complete Complete  HRI or [place]  Complete Complete  Social Work Consult for [place] Planning/Counseling   Complete  Palliative Care Screening  Complete Complete  Medication Review Press photographer) Complete Complete Complete  PCP or Specialist appointment within 3-5 days of discharge Complete    HRI or [place] Complete    SW Recovery Care/Counseling Consult Complete    [place] Not Applicable

## 2022-07-06 NOTE — Progress Notes (Signed)
PROGRESS NOTE   [name]  HEN:[id number]    DOB: 1986/02/15    DOA: 06/24/2022  PCP: Drew Ebbs, MD   I have briefly reviewed patients previous medical records in [facility].  Chief Complaint  Patient presents with   Altered Mental Status    Brief Narrative:  36 y.o. male with past medical history significant for HIV, bipolar 1 disorder, depression, schizoaffective disorder, PTSD, seizure, chronic buttock wounds, hidradenitis suppurativa who presented to [facility] ED on 9/15 via EMS after he was found altered at home by his home health nurse.  Patient apparently lives alone and is nonambulatory but has a home health nurse who assists him during the day.  Due to patient's mental status, history obtained from EMS/ED provider reports as well as mother.  Mother reported that he drinks approximately 4-5 40 ounce beers a day and has been having some increased wounds/skin breakdown to groin as well as nausea/vomiting and diarrhea.  It is reported that he typically sits in his body wastes until being seen by his home health nurses; and he typically has been drinking alcohol and not taking in anything else.  Admitted for sepsis, POA due to emphysematous cystitis, left pyelitis, cellulitis of the groin and chronic hidradenitis suppurative of multiple regions along with acute respiratory failure with hypoxia.    Remains medically optimized for DC to SNF pending bed.  Appears to be difficult to place.  Has been ready for DC since last week.  9/27: Still no bed offer.  Patient now agrees to go home with home health services tomorrow. He will need transportation back home.   Assessment & Plan:  Principal Problem:   Sepsis ([place]) Active Problems:   Protein-calorie malnutrition, severe (HCC)   Schizoaffective disorder, depressive type (HCC)   GERD (gastroesophageal reflux disease)   Cellulitis of groin   HIV disease (HCC)   Thrombocytopenia (HCC)   Anemia   Acute metabolic encephalopathy    Chronic alcohol use   Bipolar disorder (HCC)   AKI (acute kidney injury) ([place])   Emphysematous cystitis   Sepsis, POA Emphysematous cystitis/left pyelitis Cellulitis groin Chronic hidradenitis suppurativa involving groin creases, pubic region, buttocks Patient presenting to the ED via EMS after being found by home health nurse altered while lying in his own excrement.  On arrival to the ED, patient was hypothermic, elevated WBC count of 15.1 with procalcitonin 0.60, lactic acid 2.0.  MRSA PCR positive.  CT abdomen/pelvis notable for emphysematous cystitis/pyelitis and irregular dermal thickening of the gluteal soft tissues consistent with cellulitis.  Urology was consulted, who recommended broad-spectrum antibiotics, Foley catheter placed to allow for maximal decompression with no plans for left ureteral stent placement given he has no hydronephrosis and this is likely an ascending infection.  Repeat CT abdomen/pelvis 9/18 shows resolution of gas to left renal collecting system, no gas noted within bladder.  He currently has external ureteral catheter. -- Leukocytosis and elevated lactate resolved.  Blood and urine cultures negative -- De-escalated vancomycin/cefepime/metronidazole to Zyvox/Augmentin on 9/19, to complete 2 week course per urology -- Supportive care -- Foley catheter discontinued 9/19 and appears to be voiding without difficulty. -- Improved.   Acute hypoxic respiratory failure, not POA Patient overnight 9/18 developed oxygen requirement, up to 5 L nasal cannula.  Lung sounds rhonchorous with chest x-ray with mild congestion versus infiltrates.  S/p a dose of IV Lasix and hypoxia has resolved.   Anemia of chronic medical disease Anemia panel with iron 50, TIBC low  at 106, ferritin high at 386, folate 11.2, vitamin B12 452; all consistent with anemia of chronic disease. -- Hgb presented with hemoglobin of 8.5 which dropped to a low of 6.1.  S/p transfusion.  Hemoglobin stable in  the 9 g range.   Bipolar 1 disorder Schizoaffective disorder PTSD Depression/anxiety -- Lurasidone 120 mg p.o. daily -- Haldol 5 mg p.o. daily -- Lexapro 20 mg p.o. daily -- Hold home Xanax for now    Orthostatic hypotension/autonomic dysfunction -- Increased Midodrine to 15 mg p.o. 3 times daily AC   Hx HIV Follows with infectious disease outpatient, [facility].  Per review of EMR, notes state he is maintained on [place]; but although not listed on home medication list -- Continue Biktarvy -- Outpatient follow-up with infectious disease   EtOH use disorder EtOH level less than 10 on admission.  Counseled on need for complete cessation  Dysphagia: SLP input appreciated.  Have recommended nectar thick liquid, regular diet with free water protocol after oral care.  Hypokalemia: Replaced  Thrombocytopenia: Resolved.  Fall off the bed 9/24: Patient's RN reported last evening that patient was noted to be on the floor and had a small scratch over the lateral aspect of right eyebrow.  Vital signs were stable.  Ordered fall precautions, neurochecks and obtain CT of the head which showed no acute findings.  No other injuries reported.  Patient's RN updated mother and brother yesterday following the event.  Prolonged QTc: Had not appreciated his EKG from 9/19 until today, QTc: 643 ms.  We will repeat EKG stat.  Patient on polypharmacy that can worsen QTc and may need adjustments.  Potassium is 4.3.  We will check an ensure magnesium >2.  Magnesium 2.  Repeat EKG showed QTc of 460 ms.  Follow EKG periodically.  Renal insufficiency: Creatinine has bumped up slightly from 1.01-1.25.  Not high enough to call AKI.  Encourage oral fluid intake.  Follow BMP in AM.  Hypoalbuminemia: Albumin 1.9.  Management per RD.  Dysphagia: Diet as per SLP recommendations: Nectar thick liquids, regular  Body mass index is 22.35 kg/m.  Nutritional Status Nutrition Problem: Increased  nutrient needs Etiology: chronic illness Signs/Symptoms: estimated needs Interventions: Ensure Enlive (each supplement provides 350kcal and 20 grams of protein), MVI  Chronic inflammatory wounds: As per [facility] RN input 9/16 (see details in their note), diffuse area affected on the bilateral buttocks, medial and posterior thighs with scarring and pinpoint open areas, some with yellow drainage, others without.  Axilla with scarring consistent with at bedtime, no draining wounds.  Based on their evaluation, pressure injury [name].  Thereby pressure injury ruled out.  Improving.    DVT prophylaxis: SCDs Start: 06/24/22 2131     Code Status: DNR:  Family Communication: None at bedside. Disposition:  Status is: Inpatient Patient is medically optimized for DC to SNF-still no bed offer.  Now agrees to go home with home health services.  Consultants:   Urology  Procedures:   Foley catheter discontinued 9/19  Antimicrobials:   As above   Subjective:  Patient was seen and examined today.  No new complaints.  Brother at bedside.  Objective:   Vitals:   07/05/22 1332 07/05/22 2136 07/06/22 0523 07/06/22 1419  BP: 113/82 101/71 (!) 123/91 112/76  Pulse: 80 87 76 (!) 105  Resp: 20 18 16 20   Temp: (!) 97.5 F (36.4 C) 98 F (36.7 C) 98 F (36.7 C) 98.1 F (36.7 C)  TempSrc: Oral Oral Oral  Oral  SpO2: 100% 100% 99% 100%  Weight:      Height:       General.  Ill-appearing gentleman, in no acute distress. Pulmonary.  Lungs clear bilaterally, normal respiratory effort. CV.  Regular rate and rhythm, no JVD, rub or murmur. Abdomen.  Soft, nontender, nondistended, BS positive. CNS.  Alert and oriented .  No focal neurologic deficit. Extremities.  No edema, no cyanosis, pulses intact and symmetrical. Psychiatry.  Judgment and insight appears normal.    Data Reviewed:   I have personally reviewed following labs and imaging studies   CBC: Recent Labs  Lab 06/30/22 0440 07/01/22 0431  07/05/22 0431  WBC 16.5* 11.0* 10.6*  HGB 8.8* 9.2* 9.5*  HCT 28.8* 30.0* 30.8*  MCV 99.7 100.7* 100.7*  PLT 148* 153 226     Basic Metabolic Panel: Recent Labs  Lab 06/30/22 0440 07/01/22 0431 07/05/22 0431 07/06/22 0423  NA 141 142 139 140  K 3.3* 4.0 4.3 4.4  CL 111 110 105 104  CO2 26 29 30 30   GLUCOSE 118* 102* 85 78  BUN 16 15 12 12   CREATININE 1.11 1.01 1.25* 1.22  CALCIUM 8.4* 8.4* 8.7* 9.2  MG  --  1.9 2.0  --      Liver Function Tests: Recent Labs  Lab 07/01/22 0431 07/05/22 0431  AST 13* 15  ALT 9 11  ALKPHOS 101 113  BILITOT 0.3 0.5  PROT 6.4* 7.1  ALBUMIN 1.7* 1.9*     CBG: Recent Labs  Lab 07/05/22 1140 07/05/22 1730  GLUCAP 52* 95     Microbiology Studies:   No results found for this or any previous visit (from the past 240 hour(s)).   Radiology Studies:  No results found.  Scheduled Meds:    ALPRAZolam  2 mg Oral TID   amoxicillin-clavulanate  1 tablet Oral Q12H   bictegravir-emtricitabine-tenofovir AF  1 tablet Oral QAC breakfast   Chlorhexidine Gluconate Cloth  6 each Topical Q0600   escitalopram  20 mg Oral Daily   feeding supplement  237 mL Oral TID BM   folic acid  1 mg Oral Daily   haloperidol  5 mg Oral Daily   linezolid  600 mg Oral Q12H   lurasidone  120 mg Oral Q breakfast   midodrine  15 mg Oral TID WC   multivitamin with minerals  1 tablet Oral Daily   nystatin   Topical BID   pantoprazole  40 mg Oral QHS   thiamine  100 mg Oral Daily   valACYclovir  1,000 mg Oral Daily    Continuous Infusions:    sodium chloride 10 mL/hr at 06/28/22 0900     LOS: 12 days     Lorella Nimrod, MD,  FACP, [facility], [facility] [address], [facility] (Care Management Physician Certified) [place]  To contact the attending provider between 7A-7P or the covering provider during after hours 7P-7A, please log into the web site [email or web address] and access using universal [place] password for that web site. If  you do not have the password, please call the hospital operator.  07/06/2022, 4:34 PM

## 2022-07-07 DIAGNOSIS — E860 Dehydration: Secondary | ICD-10-CM

## 2022-07-07 DIAGNOSIS — A419 Sepsis, unspecified organism: Secondary | ICD-10-CM | POA: Diagnosis not present

## 2022-07-07 DIAGNOSIS — N179 Acute kidney failure, unspecified: Secondary | ICD-10-CM | POA: Diagnosis not present

## 2022-07-07 DIAGNOSIS — R6520 Severe sepsis without septic shock: Secondary | ICD-10-CM

## 2022-07-07 DIAGNOSIS — F310 Bipolar disorder, current episode hypomanic: Secondary | ICD-10-CM | POA: Diagnosis not present

## 2022-07-07 DIAGNOSIS — G9341 Metabolic encephalopathy: Secondary | ICD-10-CM | POA: Diagnosis not present

## 2022-07-07 MED ORDER — NICOTINE 14 MG/24HR TD PT24: 14.0000 mg | MEDICATED_PATCH | Freq: Every day | TRANSDERMAL | 0 refills | Status: DC

## 2022-07-07 MED ORDER — VITAMIN B-1 100 MG PO TABS: 100.0000 mg | ORAL_TABLET | Freq: Every day | ORAL | 0 refills | Status: DC

## 2022-07-07 MED ORDER — LINEZOLID 600 MG PO TABS: 600.0000 mg | ORAL_TABLET | Freq: Two times a day (BID) | ORAL | 0 refills | Status: DC

## 2022-07-07 MED ORDER — MIDODRINE HCL 5 MG PO TABS: 15.0000 mg | ORAL_TABLET | Freq: Three times a day (TID) | ORAL | 0 refills | Status: DC

## 2022-07-07 MED ORDER — LIP MEDEX EX OINT: TOPICAL_OINTMENT | CUTANEOUS | 0 refills | Status: AC | PRN

## 2022-07-07 MED ORDER — GUAIFENESIN-DM 100-10 MG/5ML PO SYRP: 5.0000 mL | ORAL_SOLUTION | ORAL | 0 refills | Status: DC | PRN

## 2022-07-07 MED ORDER — AMOXICILLIN-POT CLAVULANATE 875-125 MG PO TABS: 1.0000 | ORAL_TABLET | Freq: Two times a day (BID) | ORAL | 0 refills | Status: DC

## 2022-07-07 MED ORDER — NYSTATIN 100000 UNIT/GM EX POWD: Freq: Two times a day (BID) | CUTANEOUS | 0 refills | Status: AC

## 2022-07-07 NOTE — TOC Transition Note (Addendum)
Transition of Care [facility]) - CM/SW Discharge Note   Patient Details  Name: [name] MRN: [id number] Date of Birth: [birth year]  [facility] of [facility] CM/SW Contact:  Roseanne Kaufman, RN Phone Number: 07/07/2022, 2:29 PM   Clinical Narrative:   HHRN, PT, SW services will resume with [place]. Notified Claiborne Billings with [facility], services will resume Saturday possibly 07/08/22. Patient agrees to return home with home health services. Patient reports someone will be at home to received at home. PTAR folder at nursing desk, awaiting MD signature for DNR form. RNCM  TOC will continue to follow.   - 2:50p PTAR has been called, notified MD for HHPT, HHRN, and SW orders.   No additional TOC needs.  Final next level of care: [place] Barriers to Discharge: No Barriers Identified   Patient Goals and CMS Choice Patient states their goals for this hospitalization and ongoing recovery are:: return home   Choice offered to / list presented to : NA  Discharge Placement                       Discharge Plan and Services In-house Referral: NA Discharge Planning Services: CM Consult Post Acute Care Choice: Home Health          DME Arranged: [name]         HH Arranged: RN, PT, OT [place] Agency: [facility] (now [facility]) Date [place]: 07/07/22 Time [place]: [place] (SDOH) Interventions     Readmission Risk Interventions    02/15/2022    2:35 PM 08/24/2021    4:00 PM 08/24/2021   11:54 AM  Readmission Risk Prevention Plan  Transportation Screening Complete Complete   PCP or Specialist Appt within 3-5 Days  Complete Complete  HRI or [place]  Complete Complete  Social Work Consult for [place] Planning/Counseling   Complete  Palliative Care Screening  Complete Complete  Medication Review [employer]) Complete Complete Complete  PCP or Specialist appointment within 3-5  days of discharge Complete    HRI or [place] Complete    SW Recovery Care/Counseling Consult Complete    [place] Not Applicable

## 2022-07-07 NOTE — Discharge Summary (Signed)
Physician Discharge Summary  [name] RXV:[id number] DOB: 1985/11/19 DOA: 06/24/2022  PCP: [name], MD  Admit date: 06/24/2022 Discharge date: 07/07/2022  Time spent: 35 minutes  Recommendations for Outpatient Follow-up:  Sepsis/Emphysematous cystitis/left pyelitis/Cellulitis groin -Upon admission met criteria for sepsis -Patient presenting to the ED via EMS after being found by home health nurse altered while lying in his own excrement.  -Hypothermic, WBC count> 15.1 with procalcitonin 0.60, lactic acid 2.0.  MRSA PCR positive.  Chronic hidradenitis suppurativa involving groin creases, pubic region, buttocks   He currently has external ureteral catheter. -- Leukocytosis and elevated lactate resolved.  Blood and urine cultures negative -- De-escalated vancomycin/cefepime/metronidazole to Zyvox/Augmentin on 9/19, to complete 2 week course per urology -- Foley catheter discontinued 9/19 and appears to be voiding without difficulty. -- Improved.   Acute hypoxic respiratory failure, not POA -Patient overnight 9/18 developed oxygen requirement, up to 5 L nasal cannula.   - S/p a dose of IV Lasix and hypoxia has resolved. -Resolved on room air   Anemia of chronic medical disease -Anemia panel with iron 50, TIBC low at 106, ferritin high at 386, folate 11.2, vitamin B12 452; all consistent with anemia of chronic disease. -- Hgb presented with hemoglobin of 8.5 which dropped to a low of 6.1.  S/p transfusion.  Lab Results  Component Value Date   HGB 9.5 (L) 07/05/2022   HGB 9.2 (L) 07/01/2022   HGB 8.8 (L) 06/30/2022   HGB 9.1 (L) 06/29/2022   HGB 8.5 (L) 06/28/2022    Bipolar 1 disorder/Schizoaffective disorder/PTSD/Depression/anxiety -- Lurasidone 120 mg p.o. daily -- Haldol 5 mg p.o. daily -- Lexapro 20 mg p.o. daily -- Hold home Xanax for now    Orthostatic hypotension/autonomic dysfunction -- Increased Midodrine to 15 mg p.o. 3 times daily AC   HIV -Follows  with infectious disease outpatient, [facility].  Per review of EMR, notes state he is maintained on [place]; but although not listed on home medication list -- Continue Biktarvy -- Outpatient follow-up with infectious disease   EtOH abuse -EtOH level less than 10 on admission.  Counseled on need for complete cessation   Dysphagia: -SLP recommended nectar thick liquid, regular diet with free water protocol after oral care.   Hypokalemia: -Resolved    Thrombocytopenia: -Resolved   Fall off the bed 9/24: -Patient's RN reported last evening that patient was noted to be on the floor and had a small scratch over the lateral aspect of right eyebrow.  Vital signs were stable.  Ordered fall precautions, neurochecks and obtain CT of the head which showed no acute findings.  No other injuries reported.  Patient's RN updated mother and brother yesterday following the event.   Prolonged QTc: -Had not appreciated his EKG from 9/19 until today, QTc: 643 ms.   -Repeat EKG showed QTc of 460 ms.  -Follow EKG periodically.   Renal insufficiency: Creatinine has bumped up slightly from 1.01-1.25.  Not high enough to call AKI.  Encourage oral fluid intake.  Follow BMP in AM. Lab Results  Component Value Date   CREATININE 1.22 07/06/2022   CREATININE 1.25 (H) 07/05/2022   CREATININE 1.01 07/01/2022   CREATININE 1.11 06/30/2022   CREATININE 1.00 06/29/2022  -Stable   Hypoalbuminemia:  -Albumin 1.9   Dysphagia:  -Diet as per SLP recommendations: Nectar thick liquids, regular -Body mass index is 22.35 kg/m.   Nutritional Status Nutrition Problem: Increased nutrient needs Etiology: chronic illness Signs/Symptoms: estimated needs Interventions: Ensure Enlive (  each supplement provides 350kcal and 20 grams of protein), MVI   Chronic inflammatory wounds: -As per [facility] RN input 9/16 (see details in their note), diffuse area affected on the bilateral buttocks, medial and posterior  thighs with scarring and pinpoint open areas, some with yellow drainage, others without.  Axilla with scarring consistent with at bedtime, no draining wounds.  Based on their evaluation, pressure injury [name].  Thereby pressure injury ruled out.  Improving.    Discharge Diagnoses:  Principal Problem:   Sepsis ([place]) Active Problems:   Protein-calorie malnutrition, severe (HCC)   Schizoaffective disorder, depressive type (HCC)   GERD (gastroesophageal reflux disease)   Cellulitis of groin   HIV disease (HCC)   Thrombocytopenia (HCC)   Anemia   Acute metabolic encephalopathy   Chronic alcohol use   Bipolar disorder ([place])   AKI (acute kidney injury) ([place])   Emphysematous cystitis   Discharge Condition: Stable  Diet recommendation: Regular, fluid nectar thick  Filed Weights   06/24/22 2200  Weight: 81.1 kg    History of present illness:  36 y.o. BM PMHx HIV, bipolar 1 disorder, depression, schizoaffective disorder, PTSD, seizure, chronic buttock wounds, hidradenitis suppurativa    Presented [facility] ED on 9/15 via EMS after he was found altered at home by his home health nurse.  Patient apparently lives alone and is nonambulatory but has a home health nurse who assists him during the day.  Due to patient's mental status, history obtained from EMS/ED provider reports as well as mother.  Mother reported that he drinks approximately 4-5 40 ounce beers a day and has been having some increased wounds/skin breakdown to groin as well as nausea/vomiting and diarrhea.  It is reported that he typically sits in his body wastes until being seen by his home health nurses; and he typically has been drinking alcohol and not taking in anything else.  Admitted for sepsis, POA due to emphysematous cystitis, left pyelitis, cellulitis of the groin and chronic hidradenitis suppurative of multiple regions along with acute respiratory failure with hypoxia.     Remains medically optimized for DC to SNF pending  bed.  Appears to be difficult to place.  Has been ready for DC since last week.   9/27: Still no bed offer.  Patient now agrees to go home with home health services tomorrow. He will need transportation back home.    Hospital Course:  See above  Procedures: CT abdomen/pelvis notable for emphysematous cystitis/pyelitis and irregular dermal thickening of the gluteal soft tissues consistent with cellulitis.  Urology was consulted, who recommended broad-spectrum antibiotics, Foley catheter placed to allow for maximal decompression with no plans for left ureteral stent placement given he has no hydronephrosis and this is likely an ascending infection 9/18 repeat CT abdomen/pelvis  shows resolution of gas to left renal collecting system, no gas noted within bladder. 9/19 Foley catheter discontinued   Consultations: Urology  Antibiotics Anti-infectives (From admission, onward)    Start     Ordered Stop   07/07/22 0000  amoxicillin-clavulanate (AUGMENTIN) 875-125 MG tablet        07/07/22 1043     07/07/22 0000  linezolid (ZYVOX) 600 MG tablet        07/07/22 1043     06/28/22 1200  amoxicillin-clavulanate (AUGMENTIN) 875-125 MG per tablet 1 tablet        06/28/22 1100 07/10/22 0959   06/28/22 1200  linezolid (ZYVOX) tablet 600 mg        06/28/22 1100 07/10/22  [id number]   06/25/22 1200  vancomycin (VANCOREADY) IVPB 1250 mg/250 mL  Status:  Discontinued        06/25/22 1027 06/28/22 1056   06/25/22 1200  bictegravir-emtricitabine-tenofovir AF (BIKTARVY) 50-200-25 MG per tablet 1 tablet        06/25/22 1037     06/25/22 1000  valACYclovir (VALTREX) tablet 1,000 mg  Status:  Discontinued        06/25/22 0754 06/25/22 0800   06/25/22 1000  valACYclovir (VALTREX) tablet 1,000 mg        06/25/22 0800     06/25/22 0500  metroNIDAZOLE (FLAGYL) IVPB 500 mg  Status:  Discontinued        06/24/22 2130 06/28/22 1056   06/25/22 0400  vancomycin (VANCOREADY) IVPB 1250 mg/250 mL  Status:  Discontinued         06/24/22 2115 06/25/22 0007   06/25/22 0200  vancomycin (VANCOCIN) IVPB 1000 mg/200 mL premix  Status:  Discontinued        06/25/22 0007 06/25/22 1027   06/24/22 2215  ceFEPIme (MAXIPIME) 2 g in sodium chloride 0.9 % 100 mL IVPB  Status:  Discontinued        06/24/22 2115 06/28/22 1056   06/24/22 1500  metroNIDAZOLE (FLAGYL) IVPB 500 mg        06/24/22 1447 06/24/22 1819   06/24/22 1500  ceFEPIme (MAXIPIME) 2 g in sodium chloride 0.9 % 100 mL IVPB        06/24/22 1451 06/24/22 1557   06/24/22 1500  vancomycin (VANCOCIN) IVPB 1000 mg/200 mL premix        06/24/22 1451 06/24/22 1629         Discharge Exam: Vitals:   07/06/22 0523 07/06/22 1419 07/06/22 2314 07/07/22 0504  BP: (!) 123/91 112/76 128/83 110/84  Pulse: 76 (!) 105 90 90  Resp: _0 Temp: 98 F (36.7 C) 98.1 F (36.7 C) 97.8 F (36.6 C) 97.7 F (36.5 C)  TempSrc: Oral Oral Oral Oral  SpO2: 99% 100% 97% 100%  Weight:      Height:        General:   Ill-appearing gentleman, in no acute distress. Cardiovascular:  Lungs clear bilaterally, normal respiratory effort Respiratory: Regular rate and rhythm, no JVD, rub or murmur.  Discharge Instructions   Allergies as of 07/07/2022       Reactions   Aczone [dapsone] Other (See Comments)   Per [facility], "G6PD deficient"   Pollen Extract Itching, Other (See Comments)   Sneezing   Primaquine Phosphate Other (See Comments)   Per [facility], "G6PD deficient"        Medication List     STOP taking these medications    hydrOXYzine 50 MG tablet Commonly known as: ATARAX   Linzess 145 MCG Caps capsule Generic drug: linaclotide   trazodone 300 MG tablet Commonly known as: DESYREL       TAKE these medications    acetaminophen 500 MG tablet Commonly known as: TYLENOL Take 1,000 mg by mouth every 6 (six) hours as needed for moderate pain or mild pain.   albuterol 108 (90 Base) MCG/ACT inhaler Commonly known as: VENTOLIN HFA Inhale 1 puff  into the lungs every 6 (six) hours as needed for wheezing or shortness of breath.   alprazolam 2 MG tablet Commonly known as: XANAX Take 2 mg by mouth in the morning, at noon, and at bedtime.   amoxicillin-clavulanate 875-125 MG tablet Commonly known as: AUGMENTIN Take 1 tablet  by mouth every 12 (twelve) hours.   Biktarvy 50-200-25 MG Tabs tablet Generic drug: bictegravir-emtricitabine-tenofovir AF Take 1 tablet by mouth daily.   escitalopram 20 MG tablet Commonly known as: LEXAPRO Take 1 tablet by mouth daily.   feeding supplement Liqd Take 237 mLs by mouth 2 (two) times daily between meals.   folic acid 1 MG tablet Commonly known as: FOLVITE Take 1 mg by mouth daily.   food thickener Gel Commonly known as: SIMPLYTHICK (NECTAR/LEVEL 2/MILDLY THICK) Take 1 packet by mouth as needed.   guaiFENesin-dextromethorphan 100-10 MG/5ML syrup Commonly known as: ROBITUSSIN DM Take 5 mLs by mouth every 4 (four) hours as needed for cough (chest congestion).   haloperidol 5 MG tablet Commonly known as: HALDOL Take 5 mg by mouth daily.   ibuprofen 200 MG tablet Commonly known as: ADVIL Take 600 mg by mouth every 6 (six) hours as needed for mild pain or moderate pain.   linezolid 600 MG tablet Commonly known as: ZYVOX Take 1 tablet (600 mg total) by mouth every 12 (twelve) hours.   lip balm ointment Apply topically as needed for lip care.   Lurasidone HCl 120 MG Tabs Take 1 tablet by mouth daily.   midodrine 5 MG tablet Commonly known as: PROAMATINE Take 3 tablets (15 mg total) by mouth 3 (three) times daily with meals. What changed: how much to take   multivitamin with minerals Tabs tablet Take 1 tablet by mouth daily.   nicotine 14 mg/24hr patch Commonly known as: NICODERM CQ - dosed in mg/24 hours Place 1 patch (14 mg total) onto the skin daily.   nystatin powder Commonly known as: MYCOSTATIN/NYSTOP Apply topically 2 (two) times daily.   pantoprazole 40 MG  tablet Commonly known as: PROTONIX Take 40 mg by mouth daily.   SYSTANE OP Place 1-2 drops into both eyes daily as needed (Dry eyes).   thiamine 100 MG tablet Commonly known as: Vitamin B-1 Take 1 tablet (100 mg total) by mouth daily. Start taking on: July 08, 1609   trolamine salicylate 10 % cream Commonly known as: ASPERCREME Apply 1 Application topically as needed for muscle pain.   valACYclovir 1000 MG tablet Commonly known as: VALTREX Take 1 tablet (1,000 mg total) by mouth daily.       Allergies  Allergen Reactions   Aczone [Dapsone] Other (See Comments)    Per [facility], "G6PD deficient"   Pollen Extract Itching and Other (See Comments)    Sneezing   Primaquine Phosphate Other (See Comments)    Per [facility], "G6PD deficient"      The results of significant diagnostics from this hospitalization (including imaging, microbiology, ancillary and laboratory) are listed below for reference.    Significant Diagnostic Studies: CT HEAD WO CONTRAST (5MM)  Result Date: 07/03/2022 CLINICAL DATA:  Trauma, fall EXAM: CT HEAD WITHOUT CONTRAST TECHNIQUE: Contiguous axial images were obtained from the base of the skull through the vertex without intravenous contrast. RADIATION DOSE REDUCTION: This exam was performed according to the departmental dose-optimization program which includes automated exposure control, adjustment of the mA and/or kV according to patient size and/or use of iterative reconstruction technique. COMPARISON:  06/24/2022 FINDINGS: Brain: No acute intracranial findings are seen. There are no signs of bleeding within the cranium. Calcifications are seen in basal ganglia. Ventricles are not dilated. Cortical sulci are prominent. Vascular: Unremarkable. Skull: No fracture is seen. Sinuses/Orbits: There are no air-fluid levels in paranasal sinuses. No focal abnormalities are seen in orbits. There is subcutaneous edema/contusion  in the right periorbital region.  Other: None. IMPRESSION: No acute intracranial findings are seen in noncontrast CT brain. Electronically Signed   By: Elmer Picker M.D.   On: 07/03/2022 19:44   DG CHEST PORT 1 VIEW  Result Date: 06/29/2022 CLINICAL DATA:  [id number].  Abdominal pain. EXAM: PORTABLE CHEST 1 VIEW COMPARISON:  Portable chest yesterday at 7:31 a.m. FINDINGS: 4:57 a.m. The heart is slightly enlarged. There is increased mild perihilar vascular congestion without overt edema. Right infrahilar opacity is again noted above the elevated hemidiaphragm and again could represent atelectasis or pneumonia. There is ill-defined left infrahilar opacity which could also be atelectasis or pneumonia. The remaining lungs are generally clear. The sulci are sharp. The mediastinum is normally outlined. IMPRESSION: 1. Bilateral infrahilar atelectasis or consolidation. 2. Increased central vascular prominence without overt edema. 3. Chronic elevated right diaphragm. Electronically Signed   By: Telford Nab M.D.   On: 06/29/2022 07:45   DG CHEST PORT 1 VIEW  Result Date: 06/28/2022 CLINICAL DATA:  Provided history: Increased congestion, shortness of breath. EXAM: PORTABLE CHEST 1 VIEW COMPARISON:  Prior chest radiographs 06/24/2022 and earlier. FINDINGS: Heart size within normal limits. Opacity within the medial right lung base, new from the prior examination of 06/24/2022 and suspicious for pneumonia. Mild ill-defined opacity within the left lung base, also new and with an appearance most suggestive of atelectasis. No evidence of pleural effusion or pneumothorax. No acute bony abnormality identified. IMPRESSION: Opacity in the medial right lung base, new from the prior chest radiograph of 06/24/2022 and suspicious for pneumonia. Mild ill-defined opacity within the left lung base, also new and with an appearance most suggestive of atelectasis. Electronically Signed   By: Kellie Simmering D.O.   On: 06/28/2022 08:44   CT ABDOMEN PELVIS WO  CONTRAST  Result Date: 06/27/2022 CLINICAL DATA:  Follow-up urinary tract infection. EXAM: CT ABDOMEN AND PELVIS WITHOUT CONTRAST TECHNIQUE: Multidetector CT imaging of the abdomen and pelvis was performed following the standard protocol without IV contrast. RADIATION DOSE REDUCTION: This exam was performed according to the departmental dose-optimization program which includes automated exposure control, adjustment of the mA and/or kV according to patient size and/or use of iterative reconstruction technique. COMPARISON:  CT scan 06/24/2022 FINDINGS: Lower chest: Nodular interstitial pattern in the lung bases with patchy areas of atelectasis. Findings suggest atypical/viral pneumonia or other atypical inflammatory process. No focal pulmonary infiltrates. No pleural effusions. Hepatobiliary: No hepatic lesions or intrahepatic biliary dilatation. Gallbladder is grossly normal. No common bile duct dilatation. Pancreas: No mass, inflammation or ductal dilatation. Spleen: Normal size.  No focal lesions. Adrenals/Urinary Tract: Adrenal glands are normal. Resolution of gas in the left renal collecting system. No renal calculi or renal lesions. There is a Foley catheter in a decompressed bladder with a small amount of residual gas which is not unexpected. No gas is seen in the bladder wall. No bladder calculi. Stomach/Bowel: The stomach duodenum and small bowel are grossly normal. Moderate stool throughout colon suggesting constipation. The terminal ileum and appendix are normal. Vascular/Lymphatic: Stable age advanced atherosclerotic calcification involving the aorta and iliac arteries but no aneurysm. Stable borderline enlarged upper abdominal and retroperitoneal lymph nodes. Reproductive: The prostate gland and seminal vesicles unremarkable. Other: Stable periumbilical abdominal wall hernia containing fat. Stable borderline enlarged inguinal lymph nodes likely reactive. Musculoskeletal: Persistent large focus of  chronic AVN involving the left hip with associated severe head flattening, secondary degenerative joint disease and moderate-sized joint effusion. IMPRESSION: 1. Nodular interstitial pattern in  the lung bases with patchy areas of atelectasis. Findings suggest atypical/viral pneumonia or other atypical inflammatory process. 2. Resolution of gas in the left renal collecting system. Interval placement of Foley catheter in the bladder. No gas in the bladder is identified. 3. Moderate stool throughout the colon suggesting constipation. 4. Stable age advanced atherosclerotic calcification involving the aorta and iliac arteries. 5. 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 6. Stable periumbilical abdominal wall hernia containing fat. Aortic Atherosclerosis (ICD10-I70.0). Electronically Signed   By: Marijo Sanes M.D.   On: 06/27/2022 08:38   CT ABDOMEN PELVIS W CONTRAST  Result Date: 06/24/2022 CLINICAL DATA:  Abdominal pain, acute, nonlocalized eval for fournier's gangrene EXAM: CT ABDOMEN AND PELVIS WITH CONTRAST TECHNIQUE: Multidetector CT imaging of the abdomen and pelvis was performed using the standard protocol following bolus administration of intravenous contrast. RADIATION DOSE REDUCTION: This exam was performed according to the departmental dose-optimization program which includes automated exposure control, adjustment of the mA and/or kV according to patient size and/or use of iterative reconstruction technique. CONTRAST:  151m OMNIPAQUE IOHEXOL 300 MG/ML  SOLN COMPARISON:  None Available. FINDINGS: Lower chest: Tiny hiatal hernia.  No acute abnormality. Hepatobiliary: No focal liver abnormality. No gallstones, gallbladder wall thickening, or pericholecystic fluid. No biliary dilatation. Pancreas: No focal lesion. Normal pancreatic contour. No surrounding inflammatory changes.  No main pancreatic ductal dilatation. Spleen: Normal in size without focal abnormality. Adrenals/Urinary Tract: No adrenal nodule bilaterally. Bilateral kidneys enhance symmetrically. No hydronephrosis. No hydroureter. Air-fluid level within the urinary bladder lumen. Query gas within the urinary bladder wall best evaluated on coronal imaging. Stomach/Bowel: Stomach is within normal limits. No evidence of bowel wall thickening or dilatation. Appendix appears normal. Vascular/Lymphatic: No abdominal aorta or iliac aneurysm. Mild atherosclerotic plaque of the aorta and its branches. No abdominal, pelvic, or inguinal lymphadenopathy. Reproductive: Prostate is unremarkable. Other: No intraperitoneal free fluid. No intraperitoneal free gas. No organized fluid collection. Musculoskeletal: No subcutaneus soft tissue edema, emphysema, or on ice fluid collection within the soft tissues. Irregular dermal thickening of the gluteal soft tissues noted. Small fat containing umbilical hernia with an abdominal defect of 1.5 cm. No suspicious lytic or blastic osseous lesions. No acute displaced fracture. Multilevel degenerative changes of the spine. IMPRESSION: 1. Tiny hiatal hernia. 2. Gas noted within the urinary bladder lumen, urinary bladder wall, and left renal findings suggestive of emphysematous cystitis and pyelitis. 3. Irregular dermal thickening of the gluteal soft tissues. Correlate with physical exam for focal mass lesions versus cellulitis. 4. No organized fluid collection or subcutaneus soft tissue emphysema/edema to suggest Fournier's gangrene-however this cannot be fully excluded as this is a clinical diagnosis. 5.  Aortic Atherosclerosis (ICD10-I70.0). These results were called by telephone at the time of interpretation on 06/24/2022 at 5:40 pm to provider Dr. RWyvonnia Dusky who verbally acknowledged these results. Electronically Signed   By: MIven FinnM.D.   On: 06/24/2022 18:11   DG Chest Port 1 View  Result  Date: 06/24/2022 CLINICAL DATA:  Altered mental status.  Possible sepsis. EXAM: PORTABLE CHEST 1 VIEW COMPARISON:  Chest x-ray dated Mar 04, 2022. FINDINGS: The heart size and mediastinal contours are within normal limits. Both lungs are clear. The visualized skeletal structures are unremarkable. IMPRESSION: No active disease. Electronically Signed   By: WTitus DubinM.D.   On: 06/24/2022 15:22   CT Head Wo Contrast  Result Date: 06/24/2022 CLINICAL DATA:  Altered mental status EXAM: CT HEAD WITHOUT CONTRAST TECHNIQUE: Contiguous axial images were obtained from the base of the skull through the vertex without intravenous contrast. RADIATION DOSE REDUCTION: This exam was performed according to the departmental dose-optimization program which includes automated exposure control, adjustment of the mA and/or kV according to patient size and/or use of iterative reconstruction technique. COMPARISON:  Head CT 02/13/2022 FINDINGS: Brain: There is no acute intracranial hemorrhage, extra-axial fluid collection, or acute infarct Parenchymal volume is normal. The ventricles are normal in size. Gray-white differentiation is preserved. There is no mass lesion.  There is no mass effect or midline shift. Vascular: No hyperdense vessel or unexpected calcification. Skull: Normal. Negative for fracture or focal lesion. Sinuses/Orbits: The imaged paranasal sinuses are clear. The globes and orbits are unremarkable. Other: None. IMPRESSION: No acute intracranial pathology. Electronically Signed   By: Valetta Mole M.D.   On: 06/24/2022 15:18    Microbiology: No results found for this or any previous visit (from the past 240 hour(s)).   Labs: Basic Metabolic Panel: Recent Labs  Lab 07/01/22 0431 07/05/22 0431 07/06/22 0423  NA 142 139 140  K 4.0 4.3 4.4  CL 110 105 104  CO2 _0 GLUCOSE 102* 85 78  BUN _1 CREATININE 1.01 1.25* 1.22  CALCIUM 8.4* 8.7* 9.2  MG 1.9 2.0  --    Liver Function  Tests: Recent Labs  Lab 07/01/22 0431 07/05/22 0431  AST 13* 15  ALT 9 11  ALKPHOS 101 113  BILITOT 0.3 0.5  PROT 6.4* 7.1  ALBUMIN 1.7* 1.9*   No results for input(s): "LIPASE", "AMYLASE" in the last 168 hours. No results for input(s): "AMMONIA" in the last 168 hours. CBC: Recent Labs  Lab 07/01/22 0431 07/05/22 0431  WBC 11.0* 10.6*  HGB 9.2* 9.5*  HCT 30.0* 30.8*  MCV 100.7* 100.7*  PLT 153 226   Cardiac Enzymes: No results for input(s): "CKTOTAL", "CKMB", "CKMBINDEX", "TROPONINI" in the last 168 hours. BNP: BNP (last 3 results) No results for input(s): "BNP" in the last 8760 hours.  ProBNP (last 3 results) No results for input(s): "PROBNP" in the last 8760 hours.  CBG: Recent Labs  Lab 07/05/22 1140 07/05/22 1730  GLUCAP 52* 95       Signed:  Dia Crawford, MD [facility]

## 2022-07-08 NOTE — TOC Transition Note (Addendum)
Transition of Care [facility]) - CM/SW Discharge Note   Patient Details  Name: [name] MRN: [id number] Date of Birth: Jan 06, 1986  Transition of Care [facility]) CM/SW Contact:  Roseanne Kaufman, RN Phone Number: 07/08/2022, 12:05 PM   Clinical Narrative:   [facility] is known to [facility] was recommended by PT for short term SNF, however patient did not receive any bed offers, unable to place patient in SNF. Yesterday, [facility] arrived late in the evening and noone at residence to accept patient, resulting in patient remaining at [facility]. Patient recently reported someone would be home to receive him. Spoke with patient who provided his nurse PP:IRJJOAC- [phone] (disconnect #), [phone] (incorrect- Colp), this RNCM unable to speak with [facility] as numbers provided were incorrect. This RNCM spoke with patient's mother who reports she is unable to take care of the patient. Mother provided Ms. Althia Forts [facility]) ph# [phone]. This RNCM asked patient's mother if patient's brother can be at apartment to receive patient, mother reports her family is dysfunctional and she would let this RNCM know if brother can be available. This RNCM spoke with Joann who reports she left key in patient's mailbox and will be able to visit with him tomorrow morning. This RNCM asked patient where his mailbox is located and he reports right at front door. This RNCM confirmed with patient's mother also that mailbox is at the front door.  -This RNCM contacted [facility] and [facility] for possible acceptance with no success.   This RNCM notified TOC supervisor to advise of current challenges.         This RNCM spoke with Ula B. the DON for [facility] who provides 7 day per week HHA services for patient, who reports patient needs a 3 months assessment which will be done on Monday and HHA services will continue over the weekend.  This RNCM called PT[facility] and advised that patient's key is in mailbox which is at the front door. Patient  was wheelchair bound prior to coming to the hospital. RN notified that Corey Harold has been called. Packet is at Theme park manager.   No additonal TOC needs at this time.    Final next level of care: [place] Barriers to Discharge: No Barriers Identified   Patient Goals and CMS Choice Patient states their goals for this hospitalization and ongoing recovery are:: return home   Choice offered to / list presented to : NA  Discharge Placement  Home with home health                     Discharge Plan and Services In-house Referral: NA Discharge Planning Services: CM Consult Post Acute Care Choice: Home Health          DME Arranged: [name]         HH Arranged: RN, PT, OT [place] Agency: [facility] (now [facility]) Date [place]: 07/07/22 Time [place]: [facility] ([place]) Interventions     Readmission Risk Interventions    02/15/2022    2:35 PM 08/24/2021    4:00 PM 08/24/2021   11:54 AM  Readmission Risk Prevention Plan  Transportation Screening Complete Complete   PCP or Specialist Appt within 3-5 Days  Complete Complete  HRI or [place]  Complete Complete  Social Work Consult for [place] Planning/Counseling   Complete  Palliative Care Screening  Complete Complete  Medication Review Investment banker, corporate  Care Manager) Complete Complete Complete  PCP or Specialist appointment within 3-5 days of discharge Complete    HRI or [place] Complete    SW Recovery Care/Counseling Consult Complete    [place] Not Applicable

## 2022-07-08 NOTE — Progress Notes (Signed)
PTAR here at 2345, unable to transport patient to home because NO one there to receive him. Patient mother called, stated she lives in [place] and no one talked to her about the patient discharging. Also stated that the caregiver from the home care agency has the keys to his apartment, so his discharge needs to be coordinated with her so that she can unlock the front door. Patient is MAX assist to [place] with ADLs and ALSO needs assistance with feeding, and stated he only has a home care aide 3-4 hours a day. He stated the rest of the time he is home alone, and unable to care for himself. [name] confirmed this.  Night coverage provider notified of patient not discharging at this time.   TOC follow up in AM with plan for discharge and arrangements for home health/or family to be available to receive patient.

## 2022-07-08 NOTE — TOC Transition Note (Addendum)
Transition of Care [facility]) - CM/SW Discharge Note   Patient Details  Name: GOVIND FUREY MRN: [id number] Date of Birth: 12-May-1986  Transition of Care [facility]) CM/SW Contact:  Roseanne Kaufman, RN Phone Number: 07/08/2022, 4:01 PM   Clinical Narrative:   Please call patient's mother [place] ( ph# on file) to let her know when [facility] has picked him up. Mother reports patient's brother will meet him at his home as long as someone calls. This RNCM notified Caryl Pina, Therapist, sports.       Final next level of care: [place] Barriers to Discharge: No Barriers Identified   Patient Goals and CMS Choice Patient states their goals for this hospitalization and ongoing recovery are:: return home   Choice offered to / list presented to : NA  Discharge Placement                       Discharge Plan and Services In-house Referral: NA Discharge Planning Services: CM Consult Post Acute Care Choice: Home Health          DME Arranged: [name]         HH Arranged: RN, PT, OT [place] Agency: [facility] (now [facility]) Date [place]: 07/07/22 Time [place]: [place] (SDOH) Interventions     Readmission Risk Interventions    02/15/2022    2:35 PM 08/24/2021    4:00 PM 08/24/2021   11:54 AM  Readmission Risk Prevention Plan  Transportation Screening Complete Complete   PCP or Specialist Appt within 3-5 Days  Complete Complete  HRI or [place]  Complete Complete  Social Work Consult for [place] Planning/Counseling   Complete  Palliative Care Screening  Complete Complete  Medication Review Press photographer) Complete Complete Complete  PCP or Specialist appointment within 3-5 days of discharge Complete    HRI or [place] Complete    SW Recovery Care/Counseling Consult Complete    [place] Not Applicable

## 2022-07-27 ENCOUNTER — Other Ambulatory Visit (HOSPITAL_COMMUNITY): Payer: Self-pay

## 2022-08-12 ENCOUNTER — Encounter (HOSPITAL_COMMUNITY): Payer: Self-pay

## 2022-08-12 ENCOUNTER — Other Ambulatory Visit: Payer: Self-pay

## 2022-08-12 ENCOUNTER — Emergency Department (HOSPITAL_COMMUNITY)
Admission: EM | Admit: 2022-08-12 | Discharge: 2022-08-12 | Disposition: A | Discharge status: Home / Self Care | Payer: Medicare HMO | Attending: Emergency Medicine | Admitting: Emergency Medicine

## 2022-08-12 DIAGNOSIS — F17210 Nicotine dependence, cigarettes, uncomplicated: Secondary | ICD-10-CM | POA: Insufficient documentation

## 2022-08-12 DIAGNOSIS — F419 Anxiety disorder, unspecified: Secondary | ICD-10-CM

## 2022-08-12 DIAGNOSIS — I10 Essential (primary) hypertension: Secondary | ICD-10-CM | POA: Insufficient documentation

## 2022-08-12 DIAGNOSIS — Z21 Asymptomatic human immunodeficiency virus [HIV] infection status: Secondary | ICD-10-CM | POA: Insufficient documentation

## 2022-08-12 MED ORDER — ALPRAZOLAM 0.5 MG PO TABS
1.0000 mg | ORAL_TABLET | Freq: Once | ORAL | Status: AC
  Administered 2022-08-12: 1 mg via ORAL
  Filled 2022-08-12: qty 2

## 2022-08-12 MED ORDER — LORAZEPAM 1 MG PO TABS: 1.0000 mg | ORAL_TABLET | Freq: Once | ORAL | Status: DC

## 2022-08-12 NOTE — ED Provider Notes (Signed)
[facility] DEPT Provider Note  CSN: [id number] Arrival date & time: 08/12/22 3295  Chief Complaint(s) Psychiatric Evaluation  HPI [name] is a 36 y.o. male with history of bipolar disorder, HIV, schizoaffective disorder, hidradenitis with multiple previous admissions for superinfection and sepsis presenting with anxiety.  He reports that he called EMS because he was feeling anxious, was experiencing auditory hallucinations.  He reports that now that he is in the emergency department these have significantly improved and he feels at baseline.  He denies homicidal or suicidal ideation.  He also reports that he has stopped drinking alcohol, denies any fevers or chills at home, denies nausea or vomiting, denies any abdominal pain, any wound drainage, dysuria, back pain, cough, shortness of breath, chest pain, seizures, or any other medical complaints.  He reports he feels ready to go home.  He thinks the primary reason he feels anxious is because his psychiatrist stopped his Xanax   Past Medical History Past Medical History:  Diagnosis Date   Bipolar 1 disorder ([place])    Depression    Dizziness and giddiness 02/01/2016   Herpes genitalia    HIV disease ([place])    Hypertension    Hyponatremia    Hypothermia 08/24/2021   Migraine headache 02/01/2016   Peripheral neuropathy 10/01/2019   PTSD (post-traumatic stress disorder)    Schizoaffective disorder ([place])    Seizures ([place])    Patient Active Problem List   Diagnosis Date Noted   AKI (acute kidney injury) ([place]) 06/24/2022   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) 02/17/2022   Chronic alcohol use 02/17/2022   Bipolar disorder ([place]) 02/17/2022   Sepsis ([place]) 02/13/2022   Malnutrition of moderate degree 10/20/2021   Palliative care by specialist    DNR (do not resuscitate) 10/14/2021   Aspiration  pneumonia ([place]) 10/14/2021   FTT (failure to thrive) in adult 10/14/2021   Dysphagia 10/14/2021   Coagulopathy ([place]) 10/14/2021   Protein-calorie malnutrition, severe ([place]) 10/09/2021    Class: Chronic   Avascular necrosis of femoral head, left ([place]) 63/10/6008   Alcoholic cirrhosis of liver without ascites ([place]) 10/07/2021   Cellulitis of multiple sites of buttock 10/07/2021   Hypotension    Hypoxia 08/24/2021   Left hip pain    Transaminitis    Acute metabolic encephalopathy 93/23/5573   Cellulitis 05/07/2021   Lymphadenopathy 09/29/2020   Anemia    Upper urinary tract infection    Sepsis secondary to UTI ([place]) 03/14/2020   Cellulitis of groin 03/14/2020   HIV disease ([place])    Macrocytic anemia    Thrombocytopenia (HCC)    Prolonged QT interval    Acute respiratory failure due to COVID-19 ([place]) 10/27/2019   GERD (gastroesophageal reflux disease) 10/27/2019   Hypokalemia 10/27/2019   Peripheral neuropathy 10/01/2019   PTSD (post-traumatic stress disorder) 07/23/2018   Dizziness and giddiness 02/01/2016   Migraine headache 02/01/2016   Schizoaffective disorder, depressive type ([place]) 05/13/2015   Severe alcohol dependence ([place]) 05/13/2015   Suicidal ideation 01/12/2014   Home Medication(s) Prior to Admission medications   Medication Sig Start Date End Date Taking? Authorizing Provider  acetaminophen (TYLENOL) 500 MG tablet Take 1,000 mg by mouth every 6 (six) hours as needed for moderate pain or mild pain.    [provider]  albuterol (VENTOLIN HFA) 108 (90 Base) MCG/ACT inhaler Inhale 1 puff into the lungs every 6 (six) hours as needed  for wheezing or shortness of breath.    [provider]  alprazolam Duanne Moron) 2 MG tablet Take 2 mg by mouth in the morning, at noon, and at bedtime.    [provider]  amoxicillin-clavulanate (AUGMENTIN) 875-125 MG tablet Take 1 tablet by mouth every 12 (twelve) hours. 07/07/22   Allie Bossier, MD   bictegravir-emtricitabine-tenofovir AF (BIKTARVY) 50-200-25 MG TABS tablet Take 1 tablet by mouth daily.    [provider]  escitalopram (LEXAPRO) 20 MG tablet Take 1 tablet by mouth daily. 02/16/21   [provider]  feeding supplement (ENSURE ENLIVE / ENSURE PLUS) LIQD Take 237 mLs by mouth 2 (two) times daily between meals. 03/04/22   Florencia Reasons, MD  folic acid (FOLVITE) 1 MG tablet Take 1 mg by mouth daily. 10/03/19   [provider]  food thickener (SIMPLYTHICK, NECTAR/LEVEL 2/MILDLY THICK,) GEL Take 1 packet by mouth as needed. 08/31/21   Sheikh, Omair Latif, DO  guaiFENesin-dextromethorphan (ROBITUSSIN DM) 100-10 MG/5ML syrup Take 5 mLs by mouth every 4 (four) hours as needed for cough (chest congestion). 07/07/22   Allie Bossier, MD  haloperidol (HALDOL) 5 MG tablet Take 5 mg by mouth daily.    [provider]  ibuprofen (ADVIL) 200 MG tablet Take 600 mg by mouth every 6 (six) hours as needed for mild pain or moderate pain.    [provider]  linezolid (ZYVOX) 600 MG tablet Take 1 tablet (600 mg total) by mouth every 12 (twelve) hours. 07/07/22   Allie Bossier, MD  lip balm (CARMEX) ointment Apply topically as needed for lip care. 07/07/22   Allie Bossier, MD  Lurasidone HCl 120 MG TABS Take 1 tablet by mouth daily.    [provider]  midodrine (PROAMATINE) 5 MG tablet Take 3 tablets (15 mg total) by mouth 3 (three) times daily with meals. 07/07/22   Allie Bossier, MD  Multiple Vitamin (MULTIVITAMIN WITH MINERALS) TABS tablet Take 1 tablet by mouth daily. 09/01/21   Sheikh, Omair Latif, DO  nicotine (NICODERM CQ - DOSED IN MG/24 HOURS) 14 mg/24hr patch Place 1 patch (14 mg total) onto the skin daily. 07/07/22   Allie Bossier, MD  nystatin (MYCOSTATIN/NYSTOP) powder Apply topically 2 (two) times daily. 07/07/22   Allie Bossier, MD  pantoprazole (PROTONIX) 40 MG tablet Take 40 mg by mouth daily.    [provider]  Polyethyl  Glycol-Propyl Glycol (SYSTANE OP) Place 1-2 drops into both eyes daily as needed (Dry eyes).    [provider]  thiamine (VITAMIN B-1) 100 MG tablet Take 1 tablet (100 mg total) by mouth daily. 07/08/22   Allie Bossier, MD  trolamine salicylate (ASPERCREME) 10 % cream Apply 1 Application topically as needed for muscle pain.    [provider]  valACYclovir (VALTREX) 1000 MG tablet Take 1 tablet (1,000 mg total) by mouth daily. 05/15/15   Niel Hummer, NP  Past Surgical History Past Surgical History:  Procedure Laterality Date   BACK SURGERY     BIOPSY  02/26/2021   Procedure: BIOPSY;  Surgeon: Otis Brace, MD;  Location: [facility];  Service: Gastroenterology;;   COLONOSCOPY WITH PROPOFOL [name] 02/26/2021   Procedure: COLONOSCOPY WITH PROPOFOL;  Surgeon: Otis Brace, MD;  Location: [facility];  Service: Gastroenterology;  Laterality: [name];   ESOPHAGOGASTRODUODENOSCOPY (EGD) WITH PROPOFOL [name] 02/26/2021   Procedure: ESOPHAGOGASTRODUODENOSCOPY (EGD) WITH PROPOFOL;  Surgeon: Otis Brace, MD;  Location: [facility];  Service: Gastroenterology;  Laterality: [name];   ESOPHAGOGASTRODUODENOSCOPY (EGD) WITH PROPOFOL [name] 05/18/2021   Procedure: ESOPHAGOGASTRODUODENOSCOPY (EGD) WITH PROPOFOL;  Surgeon: Otis Brace, MD;  Location: [facility];  Service: Gastroenterology;  Laterality: [name];   HAND SURGERY     Family History Family History  Problem Relation Age of Onset   Alcohol abuse Mother    Schizophrenia Father    Depression Father    Alcohol abuse Father    Alcohol abuse Paternal Uncle    Alcohol abuse Paternal Uncle     Social History Social History   Tobacco Use   Smoking status: Every Day    Packs/day: 1.00    Types: Cigarettes   Smokeless tobacco: Never  Vaping Use   Vaping Use: Never used  Substance Use Topics    Alcohol use: Yes    Alcohol/week: 14.0 standard drinks of alcohol    Types: 14 Cans of beer per week    Comment: 5-6 40oz per day   Drug use: No   Allergies Aczone [dapsone], Pollen extract, and Primaquine phosphate  Review of Systems Review of Systems  All other systems reviewed and are negative.   Physical Exam Vital Signs  I have reviewed the triage vital signs BP 103/84   Pulse 96   Temp 98 F (36.7 C)   Resp 20   Ht 6' 3"  (1.905 m)   Wt 81 kg   SpO2 94%   BMI 22.32 kg/m  Physical Exam Vitals and nursing note reviewed.  Constitutional:      General: He is not in acute distress.    Comments: Very thin  HENT:     Mouth/Throat:     Mouth: Mucous membranes are moist.  Eyes:     Conjunctiva/sclera: Conjunctivae normal.  Cardiovascular:     Rate and Rhythm: Normal rate and regular rhythm.  Pulmonary:     Effort: Pulmonary effort is normal. No respiratory distress.     Breath sounds: Normal breath sounds.  Abdominal:     General: Abdomen is flat.     Palpations: Abdomen is soft.     Tenderness: There is no abdominal tenderness.  Genitourinary:    Comments: Chaperoned by nurse technician, multiple healed wounds in the bilateral inguinal region without evidence of active drainage, erythema or warmth.  Normal external male genitalia Musculoskeletal:     Right lower leg: No edema.     Left lower leg: No edema.  Skin:    General: Skin is warm and dry.     Capillary Refill: Capillary refill takes less than 2 seconds.  Neurological:     Mental Status: He is alert and oriented to person, place, and time. Mental status is at baseline.  Psychiatric:        Mood and Affect: Mood normal.        Behavior: Behavior normal.     Comments: Linear train of thought, endorses anxiety, denies any homicidal or suicidal ideations, reports intermittent auditory hallucinations  ED Results and Treatments Labs (all labs ordered are listed, but only abnormal results are  displayed) Labs Reviewed - No data to display                                                                                                                        Radiology No results found.  Pertinent labs & imaging results that were available during my care of the patient were reviewed by me and considered in my medical decision making (see MDM for details).  Medications Ordered in ED Medications  ALPRAZolam (XANAX) tablet 1 mg (1 mg Oral Given 08/12/22 1009)                                                                                                                                     Procedures Procedures  (including critical care time)  Medical Decision Making / ED Course   MDM:  36 year old male presenting with anxiety.  Patient thin and somewhat chronically ill-appearing, but exam overall nonfocal and vital signs are reassuring.  He has a linear train of thought, is oriented and denies any medical complaints.  He has been admitted previously for sepsis related to his groin wounds although they currently appear healing without any active drainage or erythema to suggest infectious process.  He denies any homicidal or suicidal ideation, is not grossly disorganized and engages in conversation linearly.  He reports the symptoms he called 911 for have significantly improved and he request discharge to home. He requests xanax given he has previously taken this medication will give small dose. There is no indication for medical detainment or psychiatric hold.  Will discharge patient to home with psychiatric resources      Additional history obtained: -External records from outside source obtained and reviewed including: Chart review including previous notes, labs, imaging, consultation notes including multiple prior d/c summaries for sepsis   Medicines ordered and prescription drug management: Meds ordered this encounter  Medications   DISCONTD: LORazepam (ATIVAN) tablet 1 mg    ALPRAZolam (XANAX) tablet 1 mg    -I have reviewed the patients home medicines and have made adjustments as needed   Co morbidities that complicate the patient evaluation  Past Medical History:  Diagnosis Date   Bipolar 1 disorder ([place])    Depression    Dizziness and giddiness 02/01/2016   Herpes genitalia    HIV disease ([place])  Hypertension    Hyponatremia    Hypothermia 08/24/2021   Migraine headache 02/01/2016   Peripheral neuropathy 10/01/2019   PTSD (post-traumatic stress disorder)    Schizoaffective disorder ([name])    Seizures ([place])       Dispostion: Disposition decision including need for hospitalization was considered, and patient discharged from emergency department.    Final Clinical Impression(s) / ED Diagnoses Final diagnoses:  Anxiety     This chart was dictated using voice recognition software.  Despite best efforts to proofread,  errors can occur which can change the documentation meaning.    Cristie Hem, MD 08/12/22 1013

## 2022-08-12 NOTE — ED Triage Notes (Signed)
BIBA from home with C/O AH. Pt is requesting meds to be changed. Ems reports cutting off armband from another hospital on arrival  PMHx HIV, bipolar 1 disorder, depression, schizoaffective disorder, PTSD

## 2022-08-19 LAB — PROINSULIN/INSULIN RATIO
Insulin: 1.1 u[IU]/mL
Proinsulin: 3.9 pmol/L

## 2022-08-26 ENCOUNTER — Emergency Department (HOSPITAL_COMMUNITY): Payer: Medicare HMO

## 2022-08-26 ENCOUNTER — Other Ambulatory Visit: Payer: Self-pay

## 2022-08-26 ENCOUNTER — Encounter (HOSPITAL_COMMUNITY): Payer: Self-pay

## 2022-08-26 ENCOUNTER — Inpatient Hospital Stay (HOSPITAL_COMMUNITY)
Admission: EM | Admit: 2022-08-26 | Discharge: 2022-08-30 | DRG: 689 | Disposition: A | Discharge status: Home / Self Care | Payer: Medicare HMO | Attending: Internal Medicine | Admitting: Internal Medicine

## 2022-08-26 DIAGNOSIS — E871 Hypo-osmolality and hyponatremia: Secondary | ICD-10-CM | POA: Diagnosis present

## 2022-08-26 DIAGNOSIS — Z9109 Other allergy status, other than to drugs and biological substances: Secondary | ICD-10-CM

## 2022-08-26 DIAGNOSIS — R627 Adult failure to thrive: Secondary | ICD-10-CM | POA: Diagnosis present

## 2022-08-26 DIAGNOSIS — R4182 Altered mental status, unspecified: Secondary | ICD-10-CM | POA: Diagnosis present

## 2022-08-26 DIAGNOSIS — F319 Bipolar disorder, unspecified: Secondary | ICD-10-CM | POA: Diagnosis present

## 2022-08-26 DIAGNOSIS — B9620 Unspecified Escherichia coli [E. coli] as the cause of diseases classified elsewhere: Secondary | ICD-10-CM | POA: Diagnosis present

## 2022-08-26 DIAGNOSIS — Z8619 Personal history of other infectious and parasitic diseases: Secondary | ICD-10-CM

## 2022-08-26 DIAGNOSIS — N3000 Acute cystitis without hematuria: Secondary | ICD-10-CM | POA: Diagnosis not present

## 2022-08-26 DIAGNOSIS — E860 Dehydration: Secondary | ICD-10-CM | POA: Diagnosis present

## 2022-08-26 DIAGNOSIS — L8990 Pressure ulcer of unspecified site, unspecified stage: Secondary | ICD-10-CM | POA: Insufficient documentation

## 2022-08-26 DIAGNOSIS — N390 Urinary tract infection, site not specified: Principal | ICD-10-CM | POA: Diagnosis present

## 2022-08-26 DIAGNOSIS — B20 Human immunodeficiency virus [HIV] disease: Secondary | ICD-10-CM | POA: Diagnosis present

## 2022-08-26 DIAGNOSIS — R000 Tachycardia, unspecified: Secondary | ICD-10-CM | POA: Diagnosis present

## 2022-08-26 DIAGNOSIS — G9341 Metabolic encephalopathy: Secondary | ICD-10-CM | POA: Diagnosis present

## 2022-08-26 DIAGNOSIS — I10 Essential (primary) hypertension: Secondary | ICD-10-CM | POA: Diagnosis present

## 2022-08-26 DIAGNOSIS — B964 Proteus (mirabilis) (morganii) as the cause of diseases classified elsewhere: Secondary | ICD-10-CM | POA: Diagnosis present

## 2022-08-26 DIAGNOSIS — Z79899 Other long term (current) drug therapy: Secondary | ICD-10-CM

## 2022-08-26 DIAGNOSIS — Z883 Allergy status to other anti-infective agents status: Secondary | ICD-10-CM

## 2022-08-26 DIAGNOSIS — Z888 Allergy status to other drugs, medicaments and biological substances status: Secondary | ICD-10-CM

## 2022-08-26 DIAGNOSIS — F259 Schizoaffective disorder, unspecified: Secondary | ICD-10-CM | POA: Diagnosis present

## 2022-08-26 DIAGNOSIS — F17210 Nicotine dependence, cigarettes, uncomplicated: Secondary | ICD-10-CM | POA: Diagnosis present

## 2022-08-26 DIAGNOSIS — E876 Hypokalemia: Secondary | ICD-10-CM | POA: Diagnosis present

## 2022-08-26 DIAGNOSIS — K219 Gastro-esophageal reflux disease without esophagitis: Secondary | ICD-10-CM | POA: Diagnosis present

## 2022-08-26 DIAGNOSIS — Z818 Family history of other mental and behavioral disorders: Secondary | ICD-10-CM

## 2022-08-26 DIAGNOSIS — Z20822 Contact with and (suspected) exposure to covid-19: Secondary | ICD-10-CM | POA: Diagnosis present

## 2022-08-26 DIAGNOSIS — E861 Hypovolemia: Secondary | ICD-10-CM | POA: Diagnosis present

## 2022-08-26 DIAGNOSIS — D638 Anemia in other chronic diseases classified elsewhere: Secondary | ICD-10-CM | POA: Diagnosis present

## 2022-08-26 DIAGNOSIS — L8992 Pressure ulcer of unspecified site, stage 2: Secondary | ICD-10-CM | POA: Diagnosis present

## 2022-08-26 DIAGNOSIS — Z91148 Patient's other noncompliance with medication regimen for other reason: Secondary | ICD-10-CM

## 2022-08-26 DIAGNOSIS — F4310 Post-traumatic stress disorder, unspecified: Secondary | ICD-10-CM | POA: Diagnosis present

## 2022-08-26 LAB — RAPID URINE DRUG SCREEN, HOSP PERFORMED
Amphetamines: NOT DETECTED
Barbiturates: NOT DETECTED
Benzodiazepines: NOT DETECTED
Cocaine: NOT DETECTED
Opiates: NOT DETECTED
Tetrahydrocannabinol: NOT DETECTED

## 2022-08-26 LAB — COMPREHENSIVE METABOLIC PANEL
ALT: 10 U/L (ref 0–44)
AST: 14 U/L — ABNORMAL LOW (ref 15–41)
Albumin: 2.2 g/dL — ABNORMAL LOW (ref 3.5–5.0)
Alkaline Phosphatase: 96 U/L (ref 38–126)
Anion gap: 12 (ref 5–15)
BUN: 12 mg/dL (ref 6–20)
CO2: 23 mmol/L (ref 22–32)
Calcium: 8.6 mg/dL — ABNORMAL LOW (ref 8.9–10.3)
Chloride: 97 mmol/L — ABNORMAL LOW (ref 98–111)
Creatinine, Ser: 1.17 mg/dL (ref 0.61–1.24)
GFR, Estimated: >60 mL/min (ref >60)
Glucose, Bld: 106 mg/dL — ABNORMAL HIGH (ref 70–99)
Potassium: 3 mmol/L — ABNORMAL LOW (ref 3.5–5.1)
Sodium: 132 mmol/L — ABNORMAL LOW (ref 135–145)
Total Bilirubin: 0.6 mg/dL (ref 0.3–1.2)
Total Protein: 7.9 g/dL (ref 6.5–8.1)

## 2022-08-26 LAB — CBC WITH DIFFERENTIAL/PLATELET
Abs Immature Granulocytes: 0.1 10*3/uL — ABNORMAL HIGH (ref 0.00–0.07)
Basophils Absolute: 0.1 10*3/uL (ref 0.0–0.1)
Basophils Relative: 1 %
Eosinophils Absolute: 0 10*3/uL (ref 0.0–0.5)
Eosinophils Relative: 0 %
HCT: 33.7 % — ABNORMAL LOW (ref 39.0–52.0)
Hemoglobin: 11 g/dL — ABNORMAL LOW (ref 13.0–17.0)
Immature Granulocytes: 1 %
Lymphocytes Relative: 18 %
Lymphs Abs: 2.9 10*3/uL (ref 0.7–4.0)
MCH: 29.5 pg (ref 26.0–34.0)
MCHC: 32.6 g/dL (ref 30.0–36.0)
MCV: 90.3 fL (ref 80.0–100.0)
Monocytes Absolute: 0.9 10*3/uL (ref 0.1–1.0)
Monocytes Relative: 6 %
Neutro Abs: 11.9 10*3/uL — ABNORMAL HIGH (ref 1.7–7.7)
Neutrophils Relative %: 74 %
Platelets: 310 10*3/uL (ref 150–400)
RBC: 3.73 MIL/uL — ABNORMAL LOW (ref 4.22–5.81)
RDW: 14.1 % (ref 11.5–15.5)
WBC: 15.9 10*3/uL — ABNORMAL HIGH (ref 4.0–10.5)
nRBC: 0 % (ref 0.0–0.2)

## 2022-08-26 LAB — URINALYSIS, ROUTINE W REFLEX MICROSCOPIC
Glucose, UA: NEGATIVE mg/dL
Ketones, ur: 15 mg/dL — AB
Nitrite: POSITIVE — AB
Protein, ur: >300 mg/dL — AB
Specific Gravity, Urine: 1.01 (ref 1.005–1.030)
pH: 8.5 — ABNORMAL HIGH (ref 5.0–8.0)

## 2022-08-26 LAB — RESP PANEL BY RT-PCR (FLU A&B, COVID) ARPGX2
Influenza A by PCR: NEGATIVE
Influenza B by PCR: NEGATIVE
SARS Coronavirus 2 by RT PCR: NEGATIVE

## 2022-08-26 LAB — LIPASE, BLOOD: Lipase: 31 U/L (ref 11–51)

## 2022-08-26 LAB — URINALYSIS, MICROSCOPIC (REFLEX): WBC, UA: >50 WBC/hpf (ref 0–5)

## 2022-08-26 LAB — CBG MONITORING, ED: Glucose-Capillary: 112 mg/dL — ABNORMAL HIGH (ref 70–99)

## 2022-08-26 MED ORDER — ENOXAPARIN SODIUM 40 MG/0.4ML IJ SOSY
40.0000 mg | PREFILLED_SYRINGE | Freq: Every day | INTRAMUSCULAR | Status: DC
  Administered 2022-08-27 – 2022-08-30 (×4): 40 mg via SUBCUTANEOUS
  Filled 2022-08-26 (×4): qty 0.4

## 2022-08-26 MED ORDER — NICOTINE 21 MG/24HR TD PT24
21.0000 mg | MEDICATED_PATCH | Freq: Every day | TRANSDERMAL | Status: DC | PRN
  Administered 2022-08-26 – 2022-08-29 (×3): 21 mg via TRANSDERMAL
  Filled 2022-08-26 (×3): qty 1

## 2022-08-26 MED ORDER — SODIUM CHLORIDE 0.9 % IV SOLN
1.0000 g | Freq: Once | INTRAVENOUS | Status: AC
  Administered 2022-08-27: 1 g via INTRAVENOUS
  Filled 2022-08-26: qty 10

## 2022-08-26 MED ORDER — SODIUM CHLORIDE 0.9 % IV BOLUS
1000.0000 mL | Freq: Once | INTRAVENOUS | Status: AC
  Administered 2022-08-26: 1000 mL via INTRAVENOUS

## 2022-08-26 MED ORDER — POTASSIUM CHLORIDE CRYS ER 20 MEQ PO TBCR
40.0000 meq | EXTENDED_RELEASE_TABLET | Freq: Once | ORAL | Status: AC
  Administered 2022-08-27: 40 meq via ORAL
  Filled 2022-08-26: qty 2

## 2022-08-26 NOTE — ED Triage Notes (Signed)
Pt arrival from home were he lives alone by ambulance [facility]. Home aid was on vacation for three days,  after checking on him today notice being in couch for unknown time, over feces and urine. Oriented to self and place. Disoriented to time and situation. Hx of HIV, bipolar 1 disorder, depression, schizoaffective disorder, PTSD. PCP recommend placement yesterday, family stated already working with APS Education officer, museum.

## 2022-08-26 NOTE — ED Provider Notes (Signed)
[facility] Provider Note   CSN: [id number] Arrival date & time: 08/26/22  1744     History  Chief Complaint  Patient presents with   Altered Mental Status    [name] is a 36 y.o. male with a complicated medical history of bipolar 1 disorder, depression, schizoaffective disorder, PTSD, HIV, noncompliant with medications, presenting by EMS from home with family concern for altered mental status.  Patient's brother is at bedside to provide history.  He reports that home health has not been coming out daily as promised, the patient has not been seen for 3 days.  Family is currently try to get the patient placed in a SNF for going through that process.  Do not feel he can care for himself.  He was found at home covered in stool and soiled.  Patient was also reporting hallucinations, seeing things.  HPI     Home Medications Prior to Admission medications   Medication Sig Start Date End Date Taking? Authorizing Provider  acetaminophen (TYLENOL) 500 MG tablet Take 1,000 mg by mouth every 6 (six) hours as needed for moderate pain or mild pain.    [provider]  albuterol (VENTOLIN HFA) 108 (90 Base) MCG/ACT inhaler Inhale 1 puff into the lungs every 6 (six) hours as needed for wheezing or shortness of breath.    [provider]  alprazolam Duanne Moron) 2 MG tablet Take 2 mg by mouth in the morning, at noon, and at bedtime.    [provider]  amoxicillin-clavulanate (AUGMENTIN) 875-125 MG tablet Take 1 tablet by mouth every 12 (twelve) hours. 07/07/22   Allie Bossier, MD  bictegravir-emtricitabine-tenofovir AF (BIKTARVY) 50-200-25 MG TABS tablet Take 1 tablet by mouth daily.    [provider]  escitalopram (LEXAPRO) 20 MG tablet Take 1 tablet by mouth daily. 02/16/21   [provider]  feeding supplement (ENSURE ENLIVE / ENSURE PLUS) LIQD Take 237 mLs by mouth 2 (two) times daily between meals. 03/04/22    Florencia Reasons, MD  folic acid (FOLVITE) 1 MG tablet Take 1 mg by mouth daily. 10/03/19   [provider]  food thickener (SIMPLYTHICK, NECTAR/LEVEL 2/MILDLY THICK,) GEL Take 1 packet by mouth as needed. 08/31/21   Sheikh, Omair Latif, DO  guaiFENesin-dextromethorphan (ROBITUSSIN DM) 100-10 MG/5ML syrup Take 5 mLs by mouth every 4 (four) hours as needed for cough (chest congestion). 07/07/22   Allie Bossier, MD  haloperidol (HALDOL) 5 MG tablet Take 5 mg by mouth daily.    [provider]  ibuprofen (ADVIL) 200 MG tablet Take 600 mg by mouth every 6 (six) hours as needed for mild pain or moderate pain.    [provider]  linezolid (ZYVOX) 600 MG tablet Take 1 tablet (600 mg total) by mouth every 12 (twelve) hours. 07/07/22   Allie Bossier, MD  lip balm (CARMEX) ointment Apply topically as needed for lip care. 07/07/22   Allie Bossier, MD  Lurasidone HCl 120 MG TABS Take 1 tablet by mouth daily.    [provider]  midodrine (PROAMATINE) 5 MG tablet Take 3 tablets (15 mg total) by mouth 3 (three) times daily with meals. 07/07/22   Allie Bossier, MD  Multiple Vitamin (MULTIVITAMIN WITH MINERALS) TABS tablet Take 1 tablet by mouth daily. 09/01/21   Sheikh, Omair Latif, DO  nicotine (NICODERM CQ - DOSED IN MG/24 HOURS) 14 mg/24hr patch Place 1 patch (14 mg total) onto the skin daily.  07/07/22   Allie Bossier, MD  nystatin (MYCOSTATIN/NYSTOP) powder Apply topically 2 (two) times daily. 07/07/22   Allie Bossier, MD  pantoprazole (PROTONIX) 40 MG tablet Take 40 mg by mouth daily.    [provider]  Polyethyl Glycol-Propyl Glycol (SYSTANE OP) Place 1-2 drops into both eyes daily as needed (Dry eyes).    [provider]  thiamine (VITAMIN B-1) 100 MG tablet Take 1 tablet (100 mg total) by mouth daily. 07/08/22   Allie Bossier, MD  trolamine salicylate (ASPERCREME) 10 % cream Apply 1 Application topically as needed for muscle pain.    [provider]  valACYclovir (VALTREX) 1000 MG tablet Take 1 tablet (1,000 mg total) by mouth daily. 05/15/15   Niel Hummer, NP      Allergies    Aczone [dapsone], Pollen extract, and Primaquine phosphate    Review of Systems   Review of Systems  Physical Exam Updated Vital Signs BP 121/85   Pulse 95   Temp 98.8 F (37.1 C) (Rectal)   Resp 16   Ht 6' 3"  (1.905 m)   Wt 67.9 kg   SpO2 100%   BMI 18.71 kg/m  Physical Exam  ED Results / Procedures / Treatments   Labs (all labs ordered are listed, but only abnormal results are displayed) Labs Reviewed  COMPREHENSIVE METABOLIC PANEL - Abnormal; Notable for the following components:      Result Value   Sodium 132 (*)    Potassium 3.0 (*)    Chloride 97 (*)    Glucose, Bld 106 (*)    Calcium 8.6 (*)    Albumin 2.2 (*)    AST 14 (*)    All other components within normal limits  CBC WITH DIFFERENTIAL/PLATELET - Abnormal; Notable for the following components:   WBC 15.9 (*)    RBC 3.73 (*)    Hemoglobin 11.0 (*)    HCT 33.7 (*)    Neutro Abs 11.9 (*)    Abs Immature Granulocytes 0.10 (*)    All other components within normal limits  URINALYSIS, ROUTINE W REFLEX MICROSCOPIC - Abnormal; Notable for the following components:   APPearance CLOUDY (*)    pH 8.5 (*)    Hgb urine dipstick MODERATE (*)    Bilirubin Urine SMALL (*)    Ketones, ur 15 (*)    Protein, ur >300 (*)    Nitrite POSITIVE (*)    Leukocytes,Ua LARGE (*)    All other components within normal limits  URINALYSIS, MICROSCOPIC (REFLEX) - Abnormal; Notable for the following components:   Bacteria, UA MANY (*)    All other components within normal limits  CBG MONITORING, ED - Abnormal; Notable for the following components:   Glucose-Capillary 112 (*)    All other components within normal limits  RESP PANEL BY RT-PCR (FLU A&B, COVID) ARPGX2  URINE CULTURE  LIPASE, BLOOD  RAPID URINE DRUG SCREEN, HOSP PERFORMED  T-HELPER CELLS (CD4) COUNT (NOT AT [facility])   HIV-1 RNA QUANT-NO REFLEX-BLD    EKG EKG Interpretation  Date/Time:  Friday August 26 2022 18:13:24 EST Ventricular Rate:  101 PR Interval:  132 QRS Duration: 84 QT Interval:  405 QTC Calculation: 525 R Axis:   83 Text Interpretation: Sinus tachycardia Borderline low voltage, extremity leads Abnormal T, consider ischemia, anterior leads Prolonged QT interval Confirmed by Octaviano Glow [phone]) on 08/26/2022 6:40:50 PM  Radiology CT Head Wo Contrast  Result Date: 08/26/2022 CLINICAL DATA:  Altered mental status  EXAM: CT HEAD WITHOUT CONTRAST TECHNIQUE: Contiguous axial images were obtained from the base of the skull through the vertex without intravenous contrast. RADIATION DOSE REDUCTION: This exam was performed according to the departmental dose-optimization program which includes automated exposure control, adjustment of the mA and/or kV according to patient size and/or use of iterative reconstruction technique. COMPARISON:  07/03/2022 FINDINGS: Brain: No acute intracranial findings are seen. There are no signs of bleeding within the cranium. Dense symmetric calcifications are seen in basal ganglia. Ventricles are not dilated. Cortical sulci are prominent. Vascular: Unremarkable. Skull: Unremarkable. Sinuses/Orbits: Unremarkable. Other: None. IMPRESSION: No acute intracranial findings are seen in noncontrast CT brain. Atrophy. Electronically Signed   By: Elmer Picker M.D.   On: 08/26/2022 19:02   DG Chest Portable 1 View  Result Date: 08/26/2022 CLINICAL DATA:  Pneumonia EXAM: PORTABLE CHEST 1 VIEW COMPARISON:  06/29/2022 FINDINGS: The heart size and mediastinal contours are within normal limits. Both lungs are clear. The visualized skeletal structures are unremarkable. IMPRESSION: No active disease. Electronically Signed   By: Fidela Salisbury M.D.   On: 08/26/2022 18:59    Procedures Procedures    Medications Ordered in ED Medications  nicotine (NICODERM CQ - dosed in  mg/24 hours) patch 21 mg (21 mg Transdermal Patch Applied 08/26/22 2056)  potassium chloride SA (KLOR-CON M) CR tablet 40 mEq (has no administration in time range)  cefTRIAXone (ROCEPHIN) 1 g in sodium chloride 0.9 % 100 mL IVPB (has no administration in time range)  sodium chloride 0.9 % bolus 1,000 mL (0 mLs Intravenous Stopped 08/26/22 2218)    ED Course/ Medical Decision Making/ A&P                           Medical Decision Making Amount and/or Complexity of Data Reviewed Labs: ordered. Radiology: ordered.  Risk OTC drugs. Prescription drug management. Decision regarding hospitalization.   This patient presents to the ED with concern for altered mental, hallucinations, fatigue. This involves an extensive number of treatment options, and is a complaint that carries with it a high risk of complications and morbidity.  The differential diagnosis includes metabolic encephalopathy versus infection versus other  Co-morbidities that complicate the patient evaluation: poorly controlled HIV, recurring infections, at high risk for recurring infection  Additional history obtained from patient's brother at bedside, EMS  External records from outside source obtained and reviewed including recent elevation course and discharge summary from September  I ordered and personally interpreted labs.  The pertinent results include: UA strongly consistent with UTI, also malodorous on exam.  UDS is negative.  Ethanol level is negative.  Patient has a leukocytosis with white blood cell count 15.9, mild hypokalemia potassium 3.0.  Creatinine is 1.17 within normal limits.  Albumin is low, consistent with poor nutrition and chronic cachexia.  I ordered imaging studies including CT, x-ray of the chest I independently visualized and interpreted imaging which showed no acute abnormalities noted I agree with the radiologist interpretation  The patient was maintained on a cardiac monitor.  I personally viewed  and interpreted the cardiac monitored which showed an underlying rhythm of: Sinus rhythm  Per my interpretation the patient's ECG shows no focal ischemic findings, QTc within normal limits  I ordered medication including IV fluids for hydration, IV Rocephin for UTI.  Oral potassium for hypokalemia.  Nicotine patch per patient request.   After the interventions noted above, I reevaluated the patient and found that they have: improved -  patient remains confused but heart rate is improved with fluids.  Social Determinants of Health: patient has a long history of noncompliance and polysubstance use.  I had a discussion with his brother at the bedside who is actively try to get the patient placed in a SNF.  During this process with the Education officer, museum.  However I agree the patient is very unlikely to be able to care for himself, has repeatedly been found laying on the ground soiled in his feces.  He is quite cachectic.  He is needing several medications including for HIV control.  He may need PT evaluation and potential social work eval in the hospital for placement.  Dispostion:  After consideration of the diagnostic results and the patients response to treatment, I feel that the patent would benefit from medical admission for UTI with hallucinations.         Final Clinical Impression(s) / ED Diagnoses Final diagnoses:  Altered mental status, unspecified altered mental status type  Urinary tract infection without hematuria, site unspecified  Hypokalemia    Rx / DC Orders ED Discharge Orders     None         [name], Carola Rhine, MD 08/26/22 2326

## 2022-08-26 NOTE — ED Notes (Signed)
Patient transported to CT 

## 2022-08-27 DIAGNOSIS — G9341 Metabolic encephalopathy: Secondary | ICD-10-CM | POA: Diagnosis not present

## 2022-08-27 DIAGNOSIS — R4182 Altered mental status, unspecified: Secondary | ICD-10-CM | POA: Diagnosis not present

## 2022-08-27 LAB — CBC WITH DIFFERENTIAL/PLATELET
Abs Immature Granulocytes: 0.05 10*3/uL (ref 0.00–0.07)
Basophils Absolute: 0.1 10*3/uL (ref 0.0–0.1)
Basophils Relative: 1 %
Eosinophils Absolute: 0.1 10*3/uL (ref 0.0–0.5)
Eosinophils Relative: 1 %
HCT: 35.9 % — ABNORMAL LOW (ref 39.0–52.0)
Hemoglobin: 11.3 g/dL — ABNORMAL LOW (ref 13.0–17.0)
Immature Granulocytes: 0 %
Lymphocytes Relative: 25 %
Lymphs Abs: 3.2 10*3/uL (ref 0.7–4.0)
MCH: 28.9 pg (ref 26.0–34.0)
MCHC: 31.5 g/dL (ref 30.0–36.0)
MCV: 91.8 fL (ref 80.0–100.0)
Monocytes Absolute: 0.7 10*3/uL (ref 0.1–1.0)
Monocytes Relative: 6 %
Neutro Abs: 8.7 10*3/uL — ABNORMAL HIGH (ref 1.7–7.7)
Neutrophils Relative %: 67 %
Platelets: 283 10*3/uL (ref 150–400)
RBC: 3.91 MIL/uL — ABNORMAL LOW (ref 4.22–5.81)
RDW: 14.2 % (ref 11.5–15.5)
WBC: 12.9 10*3/uL — ABNORMAL HIGH (ref 4.0–10.5)
nRBC: 0 % (ref 0.0–0.2)

## 2022-08-27 LAB — COMPREHENSIVE METABOLIC PANEL
ALT: 8 U/L (ref 0–44)
AST: 17 U/L (ref 15–41)
Albumin: 2.1 g/dL — ABNORMAL LOW (ref 3.5–5.0)
Alkaline Phosphatase: 92 U/L (ref 38–126)
Anion gap: 13 (ref 5–15)
BUN: 10 mg/dL (ref 6–20)
CO2: 20 mmol/L — ABNORMAL LOW (ref 22–32)
Calcium: 8.3 mg/dL — ABNORMAL LOW (ref 8.9–10.3)
Chloride: 100 mmol/L (ref 98–111)
Creatinine, Ser: 1.13 mg/dL (ref 0.61–1.24)
GFR, Estimated: >60 mL/min (ref >60)
Glucose, Bld: 93 mg/dL (ref 70–99)
Potassium: 3.2 mmol/L — ABNORMAL LOW (ref 3.5–5.1)
Sodium: 133 mmol/L — ABNORMAL LOW (ref 135–145)
Total Bilirubin: 0.7 mg/dL (ref 0.3–1.2)
Total Protein: 7.4 g/dL (ref 6.5–8.1)

## 2022-08-27 LAB — CBC
HCT: 32.5 % — ABNORMAL LOW (ref 39.0–52.0)
Hemoglobin: 10 g/dL — ABNORMAL LOW (ref 13.0–17.0)
MCH: 28.7 pg (ref 26.0–34.0)
MCHC: 30.8 g/dL (ref 30.0–36.0)
MCV: 93.1 fL (ref 80.0–100.0)
Platelets: 304 10*3/uL (ref 150–400)
RBC: 3.49 MIL/uL — ABNORMAL LOW (ref 4.22–5.81)
RDW: 14.2 % (ref 11.5–15.5)
WBC: 13.4 10*3/uL — ABNORMAL HIGH (ref 4.0–10.5)
nRBC: 0 % (ref 0.0–0.2)

## 2022-08-27 LAB — MAGNESIUM: Magnesium: 1.5 mg/dL — ABNORMAL LOW (ref 1.7–2.4)

## 2022-08-27 LAB — PHOSPHORUS: Phosphorus: 2.9 mg/dL (ref 2.5–4.6)

## 2022-08-27 LAB — CREATININE, SERUM
Creatinine, Ser: 1.14 mg/dL (ref 0.61–1.24)
GFR, Estimated: >60 mL/min (ref >60)

## 2022-08-27 MED ORDER — POTASSIUM CHLORIDE 10 MEQ/100ML IV SOLN
10.0000 meq | INTRAVENOUS | Status: AC
  Administered 2022-08-27 (×4): 10 meq via INTRAVENOUS
  Filled 2022-08-27 (×4): qty 100

## 2022-08-27 MED ORDER — BICTEGRAVIR-EMTRICITAB-TENOFOV 50-200-25 MG PO TABS
1.0000 | ORAL_TABLET | Freq: Every day | ORAL | Status: DC
  Administered 2022-08-27 – 2022-08-30 (×4): 1 via ORAL
  Filled 2022-08-27 (×5): qty 1

## 2022-08-27 MED ORDER — POTASSIUM CHLORIDE IN NACL 40-0.9 MEQ/L-% IV SOLN
INTRAVENOUS | Status: DC
  Filled 2022-08-27 (×2): qty 1000

## 2022-08-27 MED ORDER — ENSURE ENLIVE PO LIQD
237.0000 mL | Freq: Two times a day (BID) | ORAL | Status: DC
  Administered 2022-08-27 – 2022-08-30 (×6): 237 mL via ORAL

## 2022-08-27 MED ORDER — ALPRAZOLAM 0.5 MG PO TABS
0.5000 mg | ORAL_TABLET | Freq: Three times a day (TID) | ORAL | Status: DC | PRN
  Administered 2022-08-28 – 2022-08-29 (×3): 0.5 mg via ORAL
  Filled 2022-08-27 (×3): qty 1

## 2022-08-27 MED ORDER — FOLIC ACID 1 MG PO TABS
1.0000 mg | ORAL_TABLET | Freq: Every day | ORAL | Status: DC
  Administered 2022-08-27 – 2022-08-30 (×4): 1 mg via ORAL
  Filled 2022-08-27 (×4): qty 1

## 2022-08-27 MED ORDER — PANTOPRAZOLE SODIUM 40 MG PO TBEC
40.0000 mg | DELAYED_RELEASE_TABLET | Freq: Every day | ORAL | Status: DC
  Administered 2022-08-27 – 2022-08-30 (×4): 40 mg via ORAL
  Filled 2022-08-27 (×4): qty 1

## 2022-08-27 MED ORDER — ONDANSETRON HCL 4 MG/2ML IJ SOLN
4.0000 mg | Freq: Four times a day (QID) | INTRAMUSCULAR | Status: DC | PRN
  Administered 2022-08-29: 4 mg via INTRAVENOUS
  Filled 2022-08-27: qty 2

## 2022-08-27 MED ORDER — HYDRALAZINE HCL 20 MG/ML IJ SOLN: 10.0000 mg | INTRAMUSCULAR | Status: DC | PRN

## 2022-08-27 MED ORDER — THIAMINE MONONITRATE 100 MG PO TABS
100.0000 mg | ORAL_TABLET | Freq: Every day | ORAL | Status: DC
  Administered 2022-08-27 – 2022-08-30 (×4): 100 mg via ORAL
  Filled 2022-08-27 (×4): qty 1

## 2022-08-27 MED ORDER — SODIUM CHLORIDE 0.9 % IV SOLN
2.0000 g | Freq: Every day | INTRAVENOUS | Status: DC
  Administered 2022-08-27 – 2022-08-30 (×4): 2 g via INTRAVENOUS
  Filled 2022-08-27 (×4): qty 20

## 2022-08-27 MED ORDER — ADULT MULTIVITAMIN W/MINERALS CH
1.0000 | ORAL_TABLET | Freq: Every day | ORAL | Status: DC
  Administered 2022-08-27 – 2022-08-29 (×3): 1 via ORAL
  Filled 2022-08-27 (×4): qty 1

## 2022-08-27 MED ORDER — SODIUM CHLORIDE 0.9 % IV SOLN: INTRAVENOUS | Status: DC

## 2022-08-27 MED ORDER — POTASSIUM CHLORIDE CRYS ER 20 MEQ PO TBCR
40.0000 meq | EXTENDED_RELEASE_TABLET | Freq: Once | ORAL | Status: AC
  Administered 2022-08-27: 40 meq via ORAL
  Filled 2022-08-27: qty 2

## 2022-08-27 MED ORDER — GUAIFENESIN 100 MG/5ML PO LIQD: 5.0000 mL | ORAL | Status: DC | PRN

## 2022-08-27 MED ORDER — TRAZODONE HCL 50 MG PO TABS
50.0000 mg | ORAL_TABLET | Freq: Every evening | ORAL | Status: DC | PRN
  Administered 2022-08-28 – 2022-08-29 (×2): 50 mg via ORAL
  Filled 2022-08-27 (×2): qty 1

## 2022-08-27 MED ORDER — IPRATROPIUM-ALBUTEROL 0.5-2.5 (3) MG/3ML IN SOLN: 3.0000 mL | RESPIRATORY_TRACT | Status: DC | PRN

## 2022-08-27 MED ORDER — ESCITALOPRAM OXALATE 10 MG PO TABS
20.0000 mg | ORAL_TABLET | Freq: Every day | ORAL | Status: DC
  Administered 2022-08-27 – 2022-08-30 (×4): 20 mg via ORAL
  Filled 2022-08-27 (×4): qty 2

## 2022-08-27 MED ORDER — FOOD THICKENER (SIMPLYTHICK): 1.0000 | ORAL | Status: DC | PRN

## 2022-08-27 MED ORDER — ALPRAZOLAM 0.25 MG PO TABS: 1.0000 mg | ORAL_TABLET | Freq: Three times a day (TID) | ORAL | Status: DC | PRN

## 2022-08-27 MED ORDER — SENNOSIDES-DOCUSATE SODIUM 8.6-50 MG PO TABS: 1.0000 | ORAL_TABLET | Freq: Every evening | ORAL | Status: DC | PRN

## 2022-08-27 MED ORDER — ACETAMINOPHEN 325 MG PO TABS
650.0000 mg | ORAL_TABLET | Freq: Four times a day (QID) | ORAL | Status: DC | PRN
  Administered 2022-08-28: 650 mg via ORAL
  Filled 2022-08-27: qty 2

## 2022-08-27 NOTE — ED Notes (Signed)
ED TO INPATIENT HANDOFF REPORT    S Name/Age/Gender [name] 36 y.o. male Room/Bed: 024C/024C  Code Status   Code Status: Full Code  Home/SNF/Other Home Patient oriented to: self and place Is this baseline? No      Chief Complaint AMS (altered mental status) [R41.82]  Triage Note Pt arrival from home were he lives alone by ambulance [facility]. Home aid was on vacation for three days,  after checking on him today notice being in couch for unknown time, over feces and urine. Oriented to self and place. Disoriented to time and situation. Hx of HIV, bipolar 1 disorder, depression, schizoaffective disorder, PTSD. PCP recommend placement yesterday, family stated already working with [employer] officer, [facility].     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[facility], "G6PD deficient"   Pollen Extract Itching and Other (See Comments)    Sneezing   Primaquine Phosphate Other (See Comments)    Per [facility], "G6PD deficient"    Level of Care/Admitting Diagnosis ED Disposition     ED Disposition  Admit   Condition  --   [place]: [facility] [[id number]]  Level of Care: Telemetry Medical [104]  May admit patient to [facility] or [facility] if equivalent level of care is available:: Yes  Covid Evaluation: Asymptomatic - no recent exposure (last 10 days) testing not required  Diagnosis: AMS (altered mental status) [[id number]]  Admitting Physician: Kayleen Memos [[id number]]  Attending Physician: Kayleen Memos [[id number]]  Certification:: I certify this patient will need inpatient services for at least 2 midnights  Estimated Length of Stay: 2          B Medical/Surgery History Past Medical History:  Diagnosis Date   Bipolar 1 disorder ([place])    Depression    Dizziness and giddiness 02/01/2016   Herpes genitalia    HIV disease ([place])    Hypertension    Hyponatremia    Hypothermia 08/24/2021    Migraine headache 02/01/2016   Peripheral neuropathy 10/01/2019   PTSD (post-traumatic stress disorder)    Schizoaffective disorder ([place])    Seizures ([place])    Past Surgical History:  Procedure Laterality Date   BACK SURGERY     BIOPSY  02/26/2021   Procedure: BIOPSY;  Surgeon: Otis Brace, MD;  Location: [facility];  Service: Gastroenterology;;   COLONOSCOPY WITH PROPOFOL [name] 02/26/2021   Procedure: COLONOSCOPY WITH PROPOFOL;  Surgeon: Otis Brace, MD;  Location: [facility];  Service: Gastroenterology;  Laterality: [name];   ESOPHAGOGASTRODUODENOSCOPY (EGD) WITH PROPOFOL [name] 02/26/2021   Procedure: ESOPHAGOGASTRODUODENOSCOPY (EGD) WITH PROPOFOL;  Surgeon: Otis Brace, MD;  Location: [facility];  Service: Gastroenterology;  Laterality: [name];   ESOPHAGOGASTRODUODENOSCOPY (EGD) WITH PROPOFOL [name] 05/18/2021   Procedure: ESOPHAGOGASTRODUODENOSCOPY (EGD) WITH PROPOFOL;  Surgeon: Otis Brace, MD;  Location: [facility];  Service: Gastroenterology;  Laterality: [name];   HAND SURGERY       A IV Location/Drains/Wounds Patient Lines/Drains/Airways Status     Active Line/Drains/Airways     Name Placement date Placement time Site Days   Peripheral IV 08/26/22 20 G Left Antecubital 08/26/22  1653  Antecubital  1            Intake/Output Last 24 hours  Intake/Output Summary (Last 24 hours) at 08/27/2022 0655 Last data filed at 08/26/2022 1752 Gross per 24 hour  Intake 100 ml  Output --  Net 100 ml    Labs/Imaging Results for orders placed  or performed during the hospital encounter of 08/26/22 (from the past 48 hour(s))  CBG monitoring, ED     Status: Abnormal   Collection Time: 08/26/22  5:53 PM  Result Value Ref Range   Glucose-Capillary 112 (H) 70 - 99 mg/dL    Comment: Glucose reference range applies only to samples taken after fasting for at least 8 hours.  Comprehensive metabolic panel     Status: Abnormal   Collection Time: 08/26/22  6:20 PM  Result  Value Ref Range   Sodium 132 (L) 135 - 145 mmol/L   Potassium 3.0 (L) 3.5 - 5.1 mmol/L   Chloride 97 (L) 98 - 111 mmol/L   CO2 23 22 - 32 mmol/L   Glucose, Bld 106 (H) 70 - 99 mg/dL    Comment: Glucose reference range applies only to samples taken after fasting for at least 8 hours.   BUN 12 6 - 20 mg/dL   Creatinine, Ser 1.17 0.61 - 1.24 mg/dL   Calcium 8.6 (L) 8.9 - 10.3 mg/dL   Total Protein 7.9 6.5 - 8.1 g/dL   Albumin 2.2 (L) 3.5 - 5.0 g/dL   AST 14 (L) 15 - 41 U/L   ALT 10 0 - 44 U/L   Alkaline Phosphatase 96 38 - 126 U/L   Total Bilirubin 0.6 0.3 - 1.2 mg/dL   GFR, Estimated >60 >60 mL/min    Comment: (NOTE) Calculated using the CKD-EPI Creatinine Equation (2021)    Anion gap 12 5 - 15    Comment: Performed at [facility], [place] [address] [place], [place] [address]  CBC with Differential     Status: Abnormal   Collection Time: 08/26/22  6:20 PM  Result Value Ref Range   WBC 15.9 (H) 4.0 - 10.5 K/uL   RBC 3.73 (L) 4.22 - 5.81 MIL/uL   Hemoglobin 11.0 (L) 13.0 - 17.0 g/dL   HCT 33.7 (L) 39.0 - 52.0 %   MCV 90.3 80.0 - 100.0 fL   MCH 29.5 26.0 - 34.0 pg   MCHC 32.6 30.0 - 36.0 g/dL   RDW 14.1 11.5 - 15.5 %   Platelets 310 150 - 400 K/uL   nRBC 0.0 0.0 - 0.2 %   Neutrophils Relative % 74 %   Neutro Abs 11.9 (H) 1.7 - 7.7 K/uL   Lymphocytes Relative 18 %   Lymphs Abs 2.9 0.7 - 4.0 K/uL   Monocytes Relative 6 %   Monocytes Absolute 0.9 0.1 - 1.0 K/uL   Eosinophils Relative 0 %   Eosinophils Absolute 0.0 0.0 - 0.5 K/uL   Basophils Relative 1 %   Basophils Absolute 0.1 0.0 - 0.1 K/uL   Immature Granulocytes 1 %   Abs Immature Granulocytes 0.10 (H) 0.00 - 0.07 K/uL    Comment: Performed at [facility] [address], [place], [place] [address]  Lipase, blood     Status: None   Collection Time: 08/26/22  6:20 PM  Result Value Ref Range   Lipase 31 11 - 51 U/L    Comment: Performed at [facility] [address], [place], [place] [address]  Resp  Panel by RT-PCR (Flu A&B, Covid) Anterior Nasal Swab     Status: None   Collection Time: 08/26/22  6:20 PM   Specimen: Anterior Nasal Swab  Result Value Ref Range   SARS Coronavirus 2 by RT PCR NEGATIVE NEGATIVE    Comment: (NOTE) SARS-CoV-2 target nucleic acids are NOT DETECTED.  The SARS-CoV-2 RNA is generally detectable in upper respiratory specimens during the acute phase of infection. The lowest concentration of SARS-CoV-2 viral copies this assay can detect is 138 copies/mL. A negative result does not preclude SARS-Cov-2 infection and should not be used as the sole basis for treatment or other patient management decisions. A negative result may occur with  improper specimen collection/handling, submission of specimen other than nasopharyngeal swab, presence of viral mutation(s) within the areas targeted by this assay, and inadequate number of viral copies(<138 copies/mL). A negative result must be combined with clinical observations, patient history, and epidemiological information. The expected result is Negative.  Fact Sheet for Patients:  EntrepreneurPulse.com.au  Fact Sheet for Healthcare Providers:  IncredibleEmployment.be  This test is no t yet approved or cleared by the [place] FDA and  has been authorized for detection and/or diagnosis of SARS-CoV-2 by FDA under an Emergency Use Authorization (EUA). This EUA will remain  in effect (meaning this test can be used) for the duration of the COVID-19 declaration under Section 564(b)(1) of the Act, 21 U.S.C.section 360bbb-3(b)(1), unless the authorization is terminated  or revoked sooner.       Influenza A by PCR NEGATIVE NEGATIVE   Influenza B by PCR NEGATIVE NEGATIVE    Comment: (NOTE) The Xpert Xpress SARS-CoV-2/FLU/RSV plus assay is intended as an aid in the diagnosis of influenza from Nasopharyngeal swab specimens and should not be used as a sole basis for treatment. Nasal  washings and aspirates are unacceptable for Xpert Xpress SARS-CoV-2/FLU/RSV testing.  Fact Sheet for Patients: EntrepreneurPulse.com.au  Fact Sheet for Healthcare Providers: IncredibleEmployment.be  This test is not yet approved or cleared by the [place] FDA and has been authorized for detection and/or diagnosis of SARS-CoV-2 by FDA under an Emergency Use Authorization (EUA). This EUA will remain in effect (meaning this test can be used) for the duration of the COVID-19 declaration under Section 564(b)(1) of the Act, 21 U.S.C. section 360bbb-3(b)(1), unless the authorization is terminated or revoked.  Performed at [facility], [place] [address], [place], [place] [address]   Urinalysis, Routine w reflex microscopic     Status: Abnormal   Collection Time: 08/26/22 10:14 PM  Result Value Ref Range   Color, Urine YELLOW YELLOW   APPearance CLOUDY (A) CLEAR   Specific Gravity, Urine 1.010 1.005 - 1.030   pH 8.5 (H) 5.0 - 8.0   Glucose, UA NEGATIVE NEGATIVE mg/dL   Hgb urine dipstick MODERATE (A) NEGATIVE   Bilirubin Urine SMALL (A) NEGATIVE   Ketones, ur 15 (A) NEGATIVE mg/dL   Protein, ur >300 (A) NEGATIVE mg/dL   Nitrite POSITIVE (A) NEGATIVE   Leukocytes,Ua LARGE (A) NEGATIVE    Comment: Performed at [facility] [address] [place], [place] [address]  Rapid urine drug screen (hospital performed)     Status: None   Collection Time: 08/26/22 10:14 PM  Result Value Ref Range   Opiates NONE DETECTED NONE DETECTED   Cocaine NONE DETECTED NONE DETECTED   Benzodiazepines NONE DETECTED NONE DETECTED   Amphetamines NONE DETECTED NONE DETECTED   Tetrahydrocannabinol NONE DETECTED NONE DETECTED   Barbiturates NONE DETECTED NONE DETECTED    Comment: (NOTE) DRUG SCREEN FOR MEDICAL PURPOSES ONLY.  IF CONFIRMATION IS NEEDED FOR ANY PURPOSE, NOTIFY LAB WITHIN 5 DAYS.  LOWEST DETECTABLE LIMITS FOR URINE DRUG SCREEN Drug Class  Cutoff (ng/mL) Amphetamine and metabolites    1000 Barbiturate and metabolites    200 Benzodiazepine                 200 Opiates and metabolites        300 Cocaine and metabolites        300 THC                            50 Performed at [facility], [place] [address], [place], [place] [address]   Urinalysis, Microscopic (reflex)     Status: Abnormal   Collection Time: 08/26/22 10:14 PM  Result Value Ref Range   RBC / HPF 11-20 0 - 5 RBC/hpf   WBC, UA >50 0 - 5 WBC/hpf   Bacteria, UA MANY (A) NONE SEEN   Squamous Epithelial / LPF 0-5 0 - 5    Comment: Performed at [facility], [place] [address] [place], [place] [address]  CBC     Status: Abnormal   Collection Time: 08/27/22 12:04 AM  Result Value Ref Range   WBC 13.4 (H) 4.0 - 10.5 K/uL   RBC 3.49 (L) 4.22 - 5.81 MIL/uL   Hemoglobin 10.0 (L) 13.0 - 17.0 g/dL   HCT 32.5 (L) 39.0 - 52.0 %   MCV 93.1 80.0 - 100.0 fL   MCH 28.7 26.0 - 34.0 pg   MCHC 30.8 30.0 - 36.0 g/dL   RDW 14.2 11.5 - 15.5 %   Platelets 304 150 - 400 K/uL   nRBC 0.0 0.0 - 0.2 %    Comment: Performed at [facility], [place] [address] [place], [place] [address]  Creatinine, serum     Status: None   Collection Time: 08/27/22 12:04 AM  Result Value Ref Range   Creatinine, Ser 1.14 0.61 - 1.24 mg/dL   GFR, Estimated >60 >60 mL/min    Comment: (NOTE) Calculated using the CKD-EPI Creatinine Equation (2021) Performed at [facility] [address], [place], [place] [address]   CBC with Differential/Platelet     Status: Abnormal   Collection Time: 08/27/22  5:01 AM  Result Value Ref Range   WBC 12.9 (H) 4.0 - 10.5 K/uL   RBC 3.91 (L) 4.22 - 5.81 MIL/uL   Hemoglobin 11.3 (L) 13.0 - 17.0 g/dL   HCT 35.9 (L) 39.0 - 52.0 %   MCV 91.8 80.0 - 100.0 fL   MCH 28.9 26.0 - 34.0 pg   MCHC 31.5 30.0 - 36.0 g/dL   RDW 14.2 11.5 - 15.5 %   Platelets 283 150 - 400 K/uL   nRBC 0.0 0.0 - 0.2 %   Neutrophils Relative % 67 %    Neutro Abs 8.7 (H) 1.7 - 7.7 K/uL   Lymphocytes Relative 25 %   Lymphs Abs 3.2 0.7 - 4.0 K/uL   Monocytes Relative 6 %   Monocytes Absolute 0.7 0.1 - 1.0 K/uL   Eosinophils Relative 1 %   Eosinophils Absolute 0.1 0.0 - 0.5 K/uL   Basophils Relative 1 %   Basophils Absolute 0.1 0.0 - 0.1 K/uL   Immature Granulocytes 0 %   Abs Immature Granulocytes 0.05 0.00 - 0.07 K/uL    Comment: Performed at [facility], [place] [address], [place], [place] [address]  Comprehensive metabolic panel     Status: Abnormal   Collection Time: 08/27/22  5:01 AM  Result Value Ref Range  Sodium 133 (L) 135 - 145 mmol/L   Potassium 3.2 (L) 3.5 - 5.1 mmol/L   Chloride 100 98 - 111 mmol/L   CO2 20 (L) 22 - 32 mmol/L   Glucose, Bld 93 70 - 99 mg/dL    Comment: Glucose reference range applies only to samples taken after fasting for at least 8 hours.   BUN 10 6 - 20 mg/dL   Creatinine, Ser 1.13 0.61 - 1.24 mg/dL   Calcium 8.3 (L) 8.9 - 10.3 mg/dL   Total Protein 7.4 6.5 - 8.1 g/dL   Albumin 2.1 (L) 3.5 - 5.0 g/dL   AST 17 15 - 41 U/L   ALT 8 0 - 44 U/L   Alkaline Phosphatase 92 38 - 126 U/L   Total Bilirubin 0.7 0.3 - 1.2 mg/dL   GFR, Estimated >60 >60 mL/min    Comment: (NOTE) Calculated using the CKD-EPI Creatinine Equation (2021)    Anion gap 13 5 - 15    Comment: Performed at [facility], [place] [address], [place], [place] [address]  Magnesium     Status: Abnormal   Collection Time: 08/27/22  5:01 AM  Result Value Ref Range   Magnesium 1.5 (L) 1.7 - 2.4 mg/dL    Comment: Performed at [facility] [address], [place], [place] [address]  Phosphorus     Status: None   Collection Time: 08/27/22  5:01 AM  Result Value Ref Range   Phosphorus 2.9 2.5 - 4.6 mg/dL    Comment: Performed at [facility] [address] [place], [place] [address]   CT Head Wo Contrast  Result Date: 08/26/2022 CLINICAL DATA:  Altered mental status EXAM: CT HEAD WITHOUT CONTRAST TECHNIQUE:  Contiguous axial images were obtained from the base of the skull through the vertex without intravenous contrast. RADIATION DOSE REDUCTION: This exam was performed according to the departmental dose-optimization program which includes automated exposure control, adjustment of the mA and/or kV according to patient size and/or use of iterative reconstruction technique. COMPARISON:  07/03/2022 FINDINGS: Brain: No acute intracranial findings are seen. There are no signs of bleeding within the cranium. Dense symmetric calcifications are seen in basal ganglia. Ventricles are not dilated. Cortical sulci are prominent. Vascular: Unremarkable. Skull: Unremarkable. Sinuses/Orbits: Unremarkable. Other: None. IMPRESSION: No acute intracranial findings are seen in noncontrast CT brain. Atrophy. Electronically Signed   By: Elmer Picker M.D.   On: 08/26/2022 19:02   DG Chest Portable 1 View  Result Date: 08/26/2022 CLINICAL DATA:  Pneumonia EXAM: PORTABLE CHEST 1 VIEW COMPARISON:  06/29/2022 FINDINGS: The heart size and mediastinal contours are within normal limits. Both lungs are clear. The visualized skeletal structures are unremarkable. IMPRESSION: No active disease. Electronically Signed   By: Fidela Salisbury M.D.   On: 08/26/2022 18:59    Pending Labs Unresulted Labs (From admission, onward)     Start     Ordered   09/02/22 0500  Creatinine, serum  (enoxaparin (LOVENOX)    CrCl >/= 30 ml/min)  Weekly,   R     Comments: while on enoxaparin therapy    08/26/22 2354   08/27/22 0321  Culture, blood (Routine X 2) w Reflex to ID Panel  BLOOD CULTURE X 2,   R     Question:  Patient immune status  Answer:  Immunocompromised   08/27/22 0320   08/26/22 2250  Urine Culture  Once,   URGENT       Question:  Indication  Answer:  Dysuria   08/26/22 2249   08/26/22 1812  T-helper cells (CD4) count (not at [facility])  Once,   URGENT        08/26/22 1811   08/26/22 1812  HIV-1 RNA quant-no reflex-bld  Once,   URGENT         08/26/22 1811            Vitals/Pain Today's Vitals   08/27/22 0130 08/27/22 0215 08/27/22 0300 08/27/22 0430  BP: 112/86 109/84 110/86 105/79  Pulse: 80 91 94 98  Resp: (!) 23 14 (!) 22 15  Temp:      TempSrc:      SpO2: 100% 100% 100% 100%  Weight:      Height:      PainSc:        Isolation Precautions Airborne and Contact precautions  Medications Medications  nicotine (NICODERM CQ - dosed in mg/24 hours) patch 21 mg (21 mg Transdermal Patch Applied 08/26/22 2056)  enoxaparin (LOVENOX) injection 40 mg (has no administration in time range)  ALPRAZolam (XANAX) tablet 0.5 mg (has no administration in time range)  bictegravir-emtricitabine-tenofovir AF (BIKTARVY) 50-200-25 MG per tablet 1 tablet (has no administration in time range)  escitalopram (LEXAPRO) tablet 20 mg (has no administration in time range)  feeding supplement (ENSURE ENLIVE / ENSURE PLUS) liquid 237 mL (has no administration in time range)  folic acid (FOLVITE) tablet 1 mg (has no administration in time range)  food thickener (SIMPLYTHICK (NECTAR/LEVEL 2/MILDLY THICK)) 1 packet (has no administration in time range)  multivitamin with minerals tablet 1 tablet (has no administration in time range)  pantoprazole (PROTONIX) EC tablet 40 mg (has no administration in time range)  thiamine (VITAMIN B1) tablet 100 mg (has no administration in time range)  cefTRIAXone (ROCEPHIN) 2 g in sodium chloride 0.9 % 100 mL IVPB (has no administration in time range)  0.9 % NaCl with KCl 40 mEq / L  infusion ( Intravenous New Bag/Given 08/27/22 0545)  sodium chloride 0.9 % bolus 1,000 mL (0 mLs Intravenous Stopped 08/26/22 2218)  potassium chloride SA (KLOR-CON M) CR tablet 40 mEq (40 mEq Oral Given 08/27/22 0007)  cefTRIAXone (ROCEPHIN) 1 g in sodium chloride 0.9 % 100 mL IVPB (0 g Intravenous Stopped 08/27/22 0102)    Mobility non-ambulatory High fall risk   Focused Assessments Neuro Assessment Handoff:  Swallow  screen pass? Yes          Neuro Assessment:   Neuro Checks:      Last Documented NIHSS Modified Score:   Has TPA been given? No If patient is a Neuro Trauma and patient is going to OR before floor call report to [place] nurse: [phone] or [phone]   R Recommendations: See Admitting Provider Note  Report given to:

## 2022-08-27 NOTE — Progress Notes (Signed)
PROGRESS NOTE    [name]  ESP:[id number] DOB: 1986/07/16 DOA: 08/26/2022 PCP: Pcp, No   Brief Narrative:  36 y.o. male with medical history significant for HIV, schizoaffective disorder, PTSD, bipolar disorder, hypertension, failure to thrive, who presented from home after being found laying in his own urine and feces.  The patient lives alone and has home health aide.  His home aide was on vacation for 3 days, after they returned, they found the patient laying on the couch for an unknown amount of time, laying on his own feces and urine. Work-up revealed complicated UTI and was started on Rocephin empirically.    Assessment & Plan:  Principal Problem:   AMS (altered mental status)    Acute metabolic encephalopathy likely multifactorial secondary to dehydration, presumptive treat UTI. CT head without contrast was nonacute. Likely from dehydration and UTI. Neurochecks.  Reorient frequently and as needed Fall and aspiration precautions.   Complicated UTI, POA Follow Cultures, empiric Rocephin.    Hypovolemic hyponatremia Hypokalemia IVF, replete Lytes.    HIV, on HAART Cont home meds.  Biktarvy   Failure to thrive in adult Dietitian consult Liberalize diet.  On folic acid, multivitamin, thiamine   Chronic anxiety/depression/bipolar disorder/history affective disorder Resume home regimen   GERD PPI   DVT prophylaxis: Lovenox Code Status: Full code Family Communication:    Status is: Inpatient Should continue hospital stay until we have further culture data given his immunosuppressive state   Nutritional status          Body mass index is 18.71 kg/m.         Subjective: Seen and examined at bedside, no complaints this morning.   Examination:  General exam: Appears calm and comfortable, temporal wasting Respiratory system: Clear to auscultation. Respiratory effort normal. Cardiovascular system: S1 & S2 heard, RRR. No JVD, murmurs, rubs,  gallops or clicks. No pedal edema. Gastrointestinal system: Abdomen is nondistended, soft and nontender. No organomegaly or masses felt. Normal bowel sounds heard. Central nervous system: Alert and oriented. No focal neurological deficits. Extremities: Symmetric 5 x 5 power. Skin: No rashes, lesions or ulcers Psychiatry: Judgement and insight appear normal. Mood & affect appropriate.     Objective: Vitals:   08/27/22 0215 08/27/22 0300 08/27/22 0430 08/27/22 0908  BP: 109/84 110/86 105/79 103/86  Pulse: 91 94 98 94  Resp: 14 (!) 22 15 17   Temp:    97.6 F (36.4 C)  TempSrc:    Axillary  SpO2: 100% 100% 100% 90%  Weight:      Height:        Intake/Output Summary (Last 24 hours) at 08/27/2022 0928 Last data filed at 08/26/2022 1752 Gross per 24 hour  Intake 100 ml  Output --  Net 100 ml   Filed Weights   08/26/22 1805  Weight: 67.9 kg     Data Reviewed:   CBC: Recent Labs  Lab 08/26/22 1820 08/27/22 0004 08/27/22 0501  WBC 15.9* 13.4* 12.9*  NEUTROABS 11.9*  --  8.7*  HGB 11.0* 10.0* 11.3*  HCT 33.7* 32.5* 35.9*  MCV 90.3 93.1 91.8  PLT 310 304 633   Basic Metabolic Panel: Recent Labs  Lab 08/26/22 1820 08/27/22 0004 08/27/22 0501  NA 132*  --  133*  K 3.0*  --  3.2*  CL 97*  --  100  CO2 23  --  20*  GLUCOSE 106*  --  93  BUN 12  --  10  CREATININE 1.17 1.14 1.13  CALCIUM 8.6*  --  8.3*  MG  --   --  1.5*  PHOS  --   --  2.9   GFR: Estimated Creatinine Clearance: 85.1 mL/min (by C-G formula based on SCr of 1.13 mg/dL). Liver Function Tests: Recent Labs  Lab 08/26/22 1820 08/27/22 0501  AST 14* 17  ALT 10 8  ALKPHOS 96 92  BILITOT 0.6 0.7  PROT 7.9 7.4  ALBUMIN 2.2* 2.1*   Recent Labs  Lab 08/26/22 1820  LIPASE 31   No results for input(s): "AMMONIA" in the last 168 hours. Coagulation Profile: No results for input(s): "INR", "PROTIME" in the last 168 hours. Cardiac Enzymes: No results for input(s): "CKTOTAL", "CKMB",  "CKMBINDEX", "TROPONINI" in the last 168 hours. BNP (last 3 results) No results for input(s): "PROBNP" in the last 8760 hours. HbA1C: No results for input(s): "HGBA1C" in the last 72 hours. CBG: Recent Labs  Lab 08/26/22 1753  GLUCAP 112*   Lipid Profile: No results for input(s): "CHOL", "HDL", "LDLCALC", "TRIG", "CHOLHDL", "LDLDIRECT" in the last 72 hours. Thyroid Function Tests: No results for input(s): "TSH", "T4TOTAL", "FREET4", "T3FREE", "THYROIDAB" in the last 72 hours. Anemia Panel: No results for input(s): "VITAMINB12", "FOLATE", "FERRITIN", "TIBC", "IRON", "RETICCTPCT" in the last 72 hours. Sepsis Labs: No results for input(s): "PROCALCITON", "LATICACIDVEN" in the last 168 hours.  Recent Results (from the past 240 hour(s))  Resp Panel by RT-PCR (Flu A&B, Covid) Anterior Nasal Swab     Status: None   Collection Time: 08/26/22  6:20 PM   Specimen: Anterior Nasal Swab  Result Value Ref Range Status   SARS Coronavirus 2 by RT PCR NEGATIVE NEGATIVE Final    Comment: (NOTE) SARS-CoV-2 target nucleic acids are NOT DETECTED.  The SARS-CoV-2 RNA is generally detectable in upper respiratory specimens during the acute phase of infection. The lowest concentration of SARS-CoV-2 viral copies this assay can detect is 138 copies/mL. A negative result does not preclude SARS-Cov-2 infection and should not be used as the sole basis for treatment or other patient management decisions. A negative result may occur with  improper specimen collection/handling, submission of specimen other than nasopharyngeal swab, presence of viral mutation(s) within the areas targeted by this assay, and inadequate number of viral copies(<138 copies/mL). A negative result must be combined with clinical observations, patient history, and epidemiological information. The expected result is Negative.  Fact Sheet for Patients:  EntrepreneurPulse.com.au  Fact Sheet for Healthcare Providers:   IncredibleEmployment.be  This test is no t yet approved or cleared by the [place] FDA and  has been authorized for detection and/or diagnosis of SARS-CoV-2 by FDA under an Emergency Use Authorization (EUA). This EUA will remain  in effect (meaning this test can be used) for the duration of the COVID-19 declaration under Section 564(b)(1) of the Act, 21 U.S.C.section 360bbb-3(b)(1), unless the authorization is terminated  or revoked sooner.       Influenza A by PCR NEGATIVE NEGATIVE Final   Influenza B by PCR NEGATIVE NEGATIVE Final    Comment: (NOTE) The Xpert Xpress SARS-CoV-2/FLU/RSV plus assay is intended as an aid in the diagnosis of influenza from Nasopharyngeal swab specimens and should not be used as a sole basis for treatment. Nasal washings and aspirates are unacceptable for Xpert Xpress SARS-CoV-2/FLU/RSV testing.  Fact Sheet for Patients: EntrepreneurPulse.com.au  Fact Sheet for Healthcare Providers: IncredibleEmployment.be  This test is not yet approved or cleared by the [place] FDA and has been authorized for detection and/or diagnosis of SARS-CoV-2  by FDA under an Emergency Use Authorization (EUA). This EUA will remain in effect (meaning this test can be used) for the duration of the COVID-19 declaration under Section 564(b)(1) of the Act, 21 U.S.C. section 360bbb-3(b)(1), unless the authorization is terminated or revoked.  Performed at [facility], [place] [address] [place], [place] [address]          Radiology Studies: CT Head Wo Contrast  Result Date: 08/26/2022 CLINICAL DATA:  Altered mental status EXAM: CT HEAD WITHOUT CONTRAST TECHNIQUE: Contiguous axial images were obtained from the base of the skull through the vertex without intravenous contrast. RADIATION DOSE REDUCTION: This exam was performed according to the departmental dose-optimization program which includes automated  exposure control, adjustment of the mA and/or kV according to patient size and/or use of iterative reconstruction technique. COMPARISON:  07/03/2022 FINDINGS: Brain: No acute intracranial findings are seen. There are no signs of bleeding within the cranium. Dense symmetric calcifications are seen in basal ganglia. Ventricles are not dilated. Cortical sulci are prominent. Vascular: Unremarkable. Skull: Unremarkable. Sinuses/Orbits: Unremarkable. Other: None. IMPRESSION: No acute intracranial findings are seen in noncontrast CT brain. Atrophy. Electronically Signed   By: Elmer Picker M.D.   On: 08/26/2022 19:02   DG Chest Portable 1 View  Result Date: 08/26/2022 CLINICAL DATA:  Pneumonia EXAM: PORTABLE CHEST 1 VIEW COMPARISON:  06/29/2022 FINDINGS: The heart size and mediastinal contours are within normal limits. Both lungs are clear. The visualized skeletal structures are unremarkable. IMPRESSION: No active disease. Electronically Signed   By: Fidela Salisbury M.D.   On: 08/26/2022 18:59        Scheduled Meds:  bictegravir-emtricitabine-tenofovir AF  1 tablet Oral Daily   enoxaparin (LOVENOX) injection  40 mg Subcutaneous Daily   escitalopram  20 mg Oral Daily   feeding supplement  237 mL Oral BID BM   folic acid  1 mg Oral Daily   multivitamin with minerals  1 tablet Oral Daily   pantoprazole  40 mg Oral Daily   thiamine  100 mg Oral Daily   Continuous Infusions:  0.9 % NaCl with KCl 40 mEq / L 50 mL/hr at 08/27/22 0545   cefTRIAXone (ROCEPHIN)  IV       LOS: 1 day   Time spent= 35 mins    Aireanna Luellen Arsenio Loader, MD [facility]  If 7PM-7AM, please contact night-coverage  08/27/2022, 9:28 AM

## 2022-08-27 NOTE — H&P (Addendum)
History and Physical  [name] TXM:[id number] DOB: 09/14/1986 DOA: 08/26/2022  Referring physician: Dr. [name] Masker, [place]  PCP: Pcp, No  Outpatient Specialists: Infectious disease. Patient coming from: Home.  Chief Complaint: Altered mental status.  HPI: [name] is a 36 y.o. male with medical history significant for HIV, schizoaffective disorder, PTSD, bipolar disorder, hypertension, failure to thrive, who presented from home after being found laying in his own urine and feces.  The patient lives alone and has home health aide.  His home aide was on vacation for 3 days, after they returned, they found the patient laying on the couch for an unknown amount of time, laying on his own feces and urine.  EMS was activated and the patient was brought into the ED for further evaluation.  In the ED, work-up revealed complicated UTI with UA positive for pyuria and WBC 13.4.  Patient was started on Rocephin empirically.  At the time of this visit the patient is alert and oriented x2.  TRH, hospitalist service, was asked to admit.  ED Course: Tmax 98.8.  BP 114/86, pulse 94, respiration rate 22, O2 saturation 100% on room air.  Lab studies remarkable for WBC 13.4, hemoglobin 10.0, platelet count 304.  Review of Systems: Review of systems as noted in the HPI. All other systems reviewed and are negative.   Past Medical History:  Diagnosis Date   Bipolar 1 disorder ([place])    Depression    Dizziness and giddiness 02/01/2016   Herpes genitalia    HIV disease ([place])    Hypertension    Hyponatremia    Hypothermia 08/24/2021   Migraine headache 02/01/2016   Peripheral neuropathy 10/01/2019   PTSD (post-traumatic stress disorder)    Schizoaffective disorder ([place])    Seizures ([place])    Past Surgical History:  Procedure Laterality Date   BACK SURGERY     BIOPSY  02/26/2021   Procedure: BIOPSY;  Surgeon: Otis Brace, MD;  Location: [facility];  Service: Gastroenterology;;   COLONOSCOPY  WITH PROPOFOL [name] 02/26/2021   Procedure: COLONOSCOPY WITH PROPOFOL;  Surgeon: Otis Brace, MD;  Location: [facility];  Service: Gastroenterology;  Laterality: [name];   ESOPHAGOGASTRODUODENOSCOPY (EGD) WITH PROPOFOL [name] 02/26/2021   Procedure: ESOPHAGOGASTRODUODENOSCOPY (EGD) WITH PROPOFOL;  Surgeon: Otis Brace, MD;  Location: [facility];  Service: Gastroenterology;  Laterality: [name];   ESOPHAGOGASTRODUODENOSCOPY (EGD) WITH PROPOFOL [name] 05/18/2021   Procedure: ESOPHAGOGASTRODUODENOSCOPY (EGD) WITH PROPOFOL;  Surgeon: Otis Brace, MD;  Location: [facility];  Service: Gastroenterology;  Laterality: [name];   HAND SURGERY      Social History:  reports that he has been smoking cigarettes. He has been smoking an average of 1 pack per day. He has never used smokeless tobacco. He reports current alcohol use of about 14.0 standard drinks of alcohol per week. He reports that he does not use drugs.   Allergies  Allergen Reactions   Aczone [Dapsone] Other (See Comments)    Per Centricity, "G6PD deficient"   Pollen Extract Itching and Other (See Comments)    Sneezing   Primaquine Phosphate Other (See Comments)    Per Centricity, "G6PD deficient"    Family History  Problem Relation Age of Onset   Alcohol abuse Mother    Schizophrenia Father    Depression Father    Alcohol abuse Father    Alcohol abuse Paternal Uncle    Alcohol abuse Paternal Uncle       Prior to Admission medications   Medication Sig Start Date End Date  Taking? Authorizing Provider  acetaminophen (TYLENOL) 500 MG tablet Take 1,000 mg by mouth every 6 (six) hours as needed for moderate pain or mild pain.    [provider]  albuterol (VENTOLIN HFA) 108 (90 Base) MCG/ACT inhaler Inhale 1 puff into the lungs every 6 (six) hours as needed for wheezing or shortness of breath.    [provider]  alprazolam Duanne Moron) 2 MG tablet Take 2 mg by mouth in the morning, at noon, and at bedtime.    [provider]  amoxicillin-clavulanate (AUGMENTIN) 875-125 MG tablet Take 1 tablet by mouth every 12 (twelve) hours. 07/07/22   Allie Bossier, MD  bictegravir-emtricitabine-tenofovir AF (BIKTARVY) 50-200-25 MG TABS tablet Take 1 tablet by mouth daily.    [provider]  escitalopram (LEXAPRO) 20 MG tablet Take 1 tablet by mouth daily. 02/16/21   [provider]  feeding supplement (ENSURE ENLIVE / ENSURE PLUS) LIQD Take 237 mLs by mouth 2 (two) times daily between meals. 03/04/22   Florencia Reasons, MD  folic acid (FOLVITE) 1 MG tablet Take 1 mg by mouth daily. 10/03/19   [provider]  food thickener (SIMPLYTHICK, NECTAR/LEVEL 2/MILDLY THICK,) GEL Take 1 packet by mouth as needed. 08/31/21   Sheikh, Omair Latif, DO  guaiFENesin-dextromethorphan (ROBITUSSIN DM) 100-10 MG/5ML syrup Take 5 mLs by mouth every 4 (four) hours as needed for cough (chest congestion). 07/07/22   Allie Bossier, MD  haloperidol (HALDOL) 5 MG tablet Take 5 mg by mouth daily.    [provider]  ibuprofen (ADVIL) 200 MG tablet Take 600 mg by mouth every 6 (six) hours as needed for mild pain or moderate pain.    [provider]  linezolid (ZYVOX) 600 MG tablet Take 1 tablet (600 mg total) by mouth every 12 (twelve) hours. 07/07/22   Allie Bossier, MD  lip balm (CARMEX) ointment Apply topically as needed for lip care. 07/07/22   Allie Bossier, MD  Lurasidone HCl 120 MG TABS Take 1 tablet by mouth daily.    [provider]  midodrine (PROAMATINE) 5 MG tablet Take 3 tablets (15 mg total) by mouth 3 (three) times daily with meals. 07/07/22   Allie Bossier, MD  Multiple Vitamin (MULTIVITAMIN WITH MINERALS) TABS tablet Take 1 tablet by mouth daily. 09/01/21   Sheikh, Omair Latif, DO  nicotine (NICODERM CQ - DOSED IN MG/24 HOURS) 14 mg/24hr patch Place 1 patch (14 mg total) onto the skin daily. 07/07/22   Allie Bossier, MD  nystatin (MYCOSTATIN/NYSTOP) powder Apply topically 2 (two)  times daily. 07/07/22   Allie Bossier, MD  pantoprazole (PROTONIX) 40 MG tablet Take 40 mg by mouth daily.    [provider]  Polyethyl Glycol-Propyl Glycol (SYSTANE OP) Place 1-2 drops into both eyes daily as needed (Dry eyes).    [provider]  thiamine (VITAMIN B-1) 100 MG tablet Take 1 tablet (100 mg total) by mouth daily. 07/08/22   Allie Bossier, MD  trolamine salicylate (ASPERCREME) 10 % cream Apply 1 Application topically as needed for muscle pain.    [provider]  valACYclovir (VALTREX) 1000 MG tablet Take 1 tablet (1,000 mg total) by mouth daily. 05/15/15   Niel Hummer, NP    Physical Exam: BP 121/85   Pulse 95   Temp 98.8 F (37.1 C) (Rectal)   Resp 16   Ht 6' 3"  (1.905 m)   Wt 67.9 kg   SpO2 100%  BMI 18.71 kg/m   General: 36 y.o. year-old male well developed well nourished in no acute distress.  Alert and oriented x2. Cardiovascular: Regular rate and rhythm with no rubs or gallops.  No thyromegaly or JVD noted.  No lower extremity edema. 2/4 pulses in all 4 extremities. Respiratory: Clear to auscultation with no wheezes or rales. Good inspiratory effort. Abdomen: Soft nontender nondistended with normal bowel sounds x4 quadrants. Muskuloskeletal: No cyanosis, clubbing or edema noted bilaterally Neuro: CN II-XII intact, strength, sensation, reflexes Skin: No ulcerative lesions noted or rashes Psychiatry: Judgement and insight appear normal. Mood is appropriate for condition and setting          Labs on Admission:  Basic Metabolic Panel: Recent Labs  Lab 08/26/22 1820  NA 132*  K 3.0*  CL 97*  CO2 23  GLUCOSE 106*  BUN 12  CREATININE 1.17  CALCIUM 8.6*   Liver Function Tests: Recent Labs  Lab 08/26/22 1820  AST 14*  ALT 10  ALKPHOS 96  BILITOT 0.6  PROT 7.9  ALBUMIN 2.2*   Recent Labs  Lab 08/26/22 1820  LIPASE 31   No results for input(s): "AMMONIA" in the last 168 hours. CBC: Recent Labs  Lab  08/26/22 1820  WBC 15.9*  NEUTROABS 11.9*  HGB 11.0*  HCT 33.7*  MCV 90.3  PLT 310   Cardiac Enzymes: No results for input(s): "CKTOTAL", "CKMB", "CKMBINDEX", "TROPONINI" in the last 168 hours.  BNP (last 3 results) No results for input(s): "BNP" in the last 8760 hours.  ProBNP (last 3 results) No results for input(s): "PROBNP" in the last 8760 hours.  CBG: Recent Labs  Lab 08/26/22 1753  GLUCAP 112*    Radiological Exams on Admission: CT Head Wo Contrast  Result Date: 08/26/2022 CLINICAL DATA:  Altered mental status EXAM: CT HEAD WITHOUT CONTRAST TECHNIQUE: Contiguous axial images were obtained from the base of the skull through the vertex without intravenous contrast. RADIATION DOSE REDUCTION: This exam was performed according to the departmental dose-optimization program which includes automated exposure control, adjustment of the mA and/or kV according to patient size and/or use of iterative reconstruction technique. COMPARISON:  07/03/2022 FINDINGS: Brain: No acute intracranial findings are seen. There are no signs of bleeding within the cranium. Dense symmetric calcifications are seen in basal ganglia. Ventricles are not dilated. Cortical sulci are prominent. Vascular: Unremarkable. Skull: Unremarkable. Sinuses/Orbits: Unremarkable. Other: None. IMPRESSION: No acute intracranial findings are seen in noncontrast CT brain. Atrophy. Electronically Signed   By: Elmer Picker M.D.   On: 08/26/2022 19:02   DG Chest Portable 1 View  Result Date: 08/26/2022 CLINICAL DATA:  Pneumonia EXAM: PORTABLE CHEST 1 VIEW COMPARISON:  06/29/2022 FINDINGS: The heart size and mediastinal contours are within normal limits. Both lungs are clear. The visualized skeletal structures are unremarkable. IMPRESSION: No active disease. Electronically Signed   By: Fidela Salisbury M.D.   On: 08/26/2022 18:59    EKG: I independently viewed the EKG done and my findings are as followed: Sinus tachycardia  rate of 101.  Nonspecific ST-T changes.  QTc 525.  Assessment/Plan Present on Admission:  AMS (altered mental status)  Principal Problem:   AMS (altered mental status)  Acute metabolic encephalopathy likely multifactorial secondary to dehydration, presumptive treat UTI. CT head without contrast was nonacute. Reorient frequently and as needed Fall and aspiration precautions.  Presumptive complicated UTI, POA UA positive for pyuria Follow urine culture and blood cultures x2 peripherally Continue Rocephin started in the ED De-escalate antibiotics when  indicated.  Hypovolemic hyponatremia Hypokalemia Serum sodium 132, serum potassium 3.0. Start NS KCl 40 mEq at 50 cc/h x 2 days. Repeat chemistry panel in the morning.  HIV, on HAART Medication noncompliance Resume home regimen Follow-up on CD4 count  Failure to thrive in adult Dietitian consult Liberalize diet  Chronic anxiety/depression/bipolar disorder/history affective disorder Resume home regimen  GERD Resume home PPI    DVT prophylaxis: Subcu Lovenox daily  Code Status: Full code  Family Communication: None at bedside  Disposition Plan: Admitted to telemetry medical unit.  Consults called: None.  Admission status: Inpatient status.   Status is: Inpatient The patient requires at least 2 midnights for further evaluation and treatment of present condition.   Kayleen Memos MD [facility] Pager [phone]  If 7PM-7AM, please contact night-coverage [email or web address] Password [email or web address]  08/27/2022, 12:17 AM

## 2022-08-27 NOTE — ED Notes (Signed)
Pt rolled and cleaned, loose brown bowel movement. Pt rear covered in pressure wounds in various stages of healing. Mepilex padding applied to areas of significant skin break down.

## 2022-08-27 NOTE — Progress Notes (Signed)
Per Dr. Sidney Ace, pt needs to remain on telemetry monitoring r/t sepsis protocol.

## 2022-08-28 DIAGNOSIS — L8990 Pressure ulcer of unspecified site, unspecified stage: Secondary | ICD-10-CM | POA: Insufficient documentation

## 2022-08-28 DIAGNOSIS — R4182 Altered mental status, unspecified: Secondary | ICD-10-CM | POA: Diagnosis not present

## 2022-08-28 LAB — CBC
HCT: 27.7 % — ABNORMAL LOW (ref 39.0–52.0)
Hemoglobin: 9 g/dL — ABNORMAL LOW (ref 13.0–17.0)
MCH: 29.6 pg (ref 26.0–34.0)
MCHC: 32.5 g/dL (ref 30.0–36.0)
MCV: 91.1 fL (ref 80.0–100.0)
Platelets: 233 10*3/uL (ref 150–400)
RBC: 3.04 MIL/uL — ABNORMAL LOW (ref 4.22–5.81)
RDW: 14.4 % (ref 11.5–15.5)
WBC: 11.9 10*3/uL — ABNORMAL HIGH (ref 4.0–10.5)
nRBC: 0 % (ref 0.0–0.2)

## 2022-08-28 LAB — BASIC METABOLIC PANEL
Anion gap: 10 (ref 5–15)
BUN: 8 mg/dL (ref 6–20)
CO2: 20 mmol/L — ABNORMAL LOW (ref 22–32)
Calcium: 7.9 mg/dL — ABNORMAL LOW (ref 8.9–10.3)
Chloride: 105 mmol/L (ref 98–111)
Creatinine, Ser: 1.02 mg/dL (ref 0.61–1.24)
GFR, Estimated: >60 mL/min (ref >60)
Glucose, Bld: 60 mg/dL — ABNORMAL LOW (ref 70–99)
Potassium: 3.7 mmol/L (ref 3.5–5.1)
Sodium: 135 mmol/L (ref 135–145)

## 2022-08-28 LAB — HIV-1 RNA QUANT-NO REFLEX-BLD
HIV 1 RNA Quant: 330 copies/mL
LOG10 HIV-1 RNA: 2.519 log10copy/mL

## 2022-08-28 LAB — MAGNESIUM: Magnesium: 1.4 mg/dL — ABNORMAL LOW (ref 1.7–2.4)

## 2022-08-28 MED ORDER — BOOST / RESOURCE BREEZE PO LIQD CUSTOM
1.0000 | Freq: Two times a day (BID) | ORAL | Status: DC
  Administered 2022-08-28: 1 via ORAL

## 2022-08-28 MED ORDER — POTASSIUM CHLORIDE CRYS ER 20 MEQ PO TBCR
20.0000 meq | EXTENDED_RELEASE_TABLET | Freq: Once | ORAL | Status: AC
  Administered 2022-08-28: 20 meq via ORAL
  Filled 2022-08-28: qty 1

## 2022-08-28 MED ORDER — MAGNESIUM SULFATE 4 GM/100ML IV SOLN
4.0000 g | Freq: Once | INTRAVENOUS | Status: AC
  Administered 2022-08-28: 4 g via INTRAVENOUS
  Filled 2022-08-28: qty 100

## 2022-08-28 NOTE — Progress Notes (Signed)
Initial Nutrition Assessment RD working remotely.   DOCUMENTATION CODES:   Not applicable  INTERVENTION:  - continue Ensure Plus High Protein BID, each supplement provides 350 kcal and 20 grams of protein.  - ordered Boost Breeze BID, each supplement provides 250 kcal and 9 grams of protein  - complete NFPE when feasible.   NUTRITION DIAGNOSIS:   Increased nutrient needs related to acute illness, wound healing as evidenced by estimated needs.  GOAL:   Patient will meet greater than or equal to 90% of their needs  MONITOR:   PO intake, Supplement acceptance, Labs, Weight trends, Skin  REASON FOR ASSESSMENT:   Consult Assessment of nutrition requirement/status  ASSESSMENT:   36 y.o. male with medical history of HIV, schizoaffective disorder, PTSD, bipolar disorder, HTN, and failure to thrive. He presented to the ED after being found at home, laying in his urine and feces. He lives alone and has a Programmer, applications. His Aide was on vacations for 3 days and when they returned they found the patient laying on the couch in feces and urine. It is unknown how long he had been on the couch. In the ED he was found to have a UTI.  RD familiar with patient from previous admissions. Unable to reach patient by phone this evening.   No meal intake percentages documented this admission. He has accepted all bottles of Ensure offered since order placed at time of admission.  Most recent RD note ws 06/25/22 at which time it was documented that patient is non-ambulatory at baseline and that his mother reported he was drinking 4-5 40oz beers/day and having no other PO intake at that time.   Weight on 11/17 was 150 lb and weight on 06/24/22 was 178 lb. This indicates 28 lb weight loss (15.7% body weight). Suspect some degree of malnutrition but unable to confirm without completion of NFPE.  Per notes: - acute metabolic encephalopathy thought to be multifactorial (dehydration, UTI) - complicated  UTI - hypovolemic hyponatremia on admission - HIV on HAART - FTT in adult--folic acid, multivitamin, and thiamine added   Labs reviewed; Ca: 7.9 mg/dl, Mg: 1.4 mg/d.  Medications reviewed; 1 mg folvite/day, 4 g IV Mg sulfate x1 run 11/19, 40 mg oral protonix/day, 10 mEq IV KCl x4 runs 11/18, 20 mEq Klor-Con x1 dose 11/19, 100 mg oral thiamine/day, 1 tablet multivitamin with minerals/day.  IVF; NS @ 100 ml/hr.    NUTRITION - FOCUSED PHYSICAL EXAM:  RD working remotely.  Diet Order:   Diet Order             Diet regular Room service appropriate? Yes; Fluid consistency: Thin  Diet effective now                   EDUCATION NEEDS:   Not appropriate for education at this time  Skin:  Skin Assessment: Skin Integrity Issues: Skin Integrity Issues:: Stage II Stage II: bilateral buttocks  Last BM:  PTA/unknown  Height:   Ht Readings from Last 1 Encounters:  08/26/22 6' 3"  (1.905 m)    Weight:   Wt Readings from Last 1 Encounters:  08/26/22 67.9 kg     BMI:  Body mass index is 18.71 kg/m.  Estimated Nutritional Needs:  Kcal:  2300-2500 kcal Protein:  115-130 grams Fluid:  >/= 2.5 L/day     Jarome Matin, MS, RD, LDN, CNSC Clinical Dietitian PRN/Relief staff On-call/weekend pager # available in [facility]

## 2022-08-28 NOTE — Progress Notes (Signed)
PROGRESS NOTE    [name]  LGX:[id number] DOB: May 31, 1986 DOA: 08/26/2022 PCP: Pcp, No   Brief Narrative:  36 y.o. male with medical history significant for HIV, schizoaffective disorder, PTSD, bipolar disorder, hypertension, failure to thrive, who presented from home after being found laying in his own urine and feces.  The patient lives alone and has home health aide.  His home aide was on vacation for 3 days, after they returned, they found the patient laying on the couch for an unknown amount of time, laying on his own feces and urine. Work-up revealed complicated UTI and was started on Rocephin empirically.    Assessment & Plan:  Principal Problem:   AMS (altered mental status) Active Problems:   Pressure injury of skin    Acute metabolic encephalopathy likely multifactorial secondary to dehydration, presumptive treat UTI. CT head without contrast was nonacute. Likely from dehydration and UTI. Neurochecks.  Reorient frequently and as needed Fall and aspiration precautions.   Complicated UTI, POA Tachycardia, sinus Follow Cultures, empiric Rocephin. Monitor Ucx & Bcx. Increase IVF to 100cc/hr   Hypovolemic hyponatremia Hypokalemia IVF, replete Lytes.    HIV, on HAART Cont home meds.  Biktarvy   Failure to thrive in adult Dietitian consult Liberalize diet.  On folic acid, multivitamin, thiamine   Chronic anxiety/depression/bipolar disorder/history affective disorder Resume home regimen   GERD PPI   DVT prophylaxis: Lovenox Code Status: Full code Family Communication:    Status is: Inpatient Should continue hospital stay until we have further culture data given his immunosuppressive state, need to ensure cultures remains neg.       Subjective: Seen and examined at bedside, felt little nauseous this morning but no other complaints.  Remains afebrile  Examination:  Constitutional: Not in acute distress Respiratory: Clear to auscultation  bilaterally Cardiovascular: Normal sinus rhythm, no rubs Abdomen: Nontender nondistended good bowel sounds Musculoskeletal: No edema noted Skin: No rashes seen Neurologic: CN 2-12 grossly intact.  And nonfocal Psychiatric: Normal judgment and insight. Alert and oriented x 3. Normal mood.    Objective: Vitals:   08/27/22 1557 08/27/22 1942 08/27/22 2000 08/28/22 0000  BP: 101/77 100/67 101/68 99/72  Pulse: (!) 106 (!) 107 96 99  Resp: 18 16 16 18   Temp: 97.7 F (36.5 C) 99.1 F (37.3 C) 98.9 F (37.2 C) 98.6 F (37 C)  TempSrc: Oral Oral Oral Oral  SpO2: 100% 100% 100% 100%  Weight:      Height:       No intake or output data in the 24 hours ending 08/28/22 0350  Filed Weights   08/26/22 1805  Weight: 67.9 kg     Data Reviewed:   CBC: Recent Labs  Lab 08/26/22 1820 08/27/22 0004 08/27/22 0501  WBC 15.9* 13.4* 12.9*  NEUTROABS 11.9*  --  8.7*  HGB 11.0* 10.0* 11.3*  HCT 33.7* 32.5* 35.9*  MCV 90.3 93.1 91.8  PLT 310 304 814   Basic Metabolic Panel: Recent Labs  Lab 08/26/22 1820 08/27/22 0004 08/27/22 0501  NA 132*  --  133*  K 3.0*  --  3.2*  CL 97*  --  100  CO2 23  --  20*  GLUCOSE 106*  --  93  BUN 12  --  10  CREATININE 1.17 1.14 1.13  CALCIUM 8.6*  --  8.3*  MG  --   --  1.5*  PHOS  --   --  2.9   GFR: Estimated Creatinine Clearance: 85.1 mL/min (by  C-G formula based on SCr of 1.13 mg/dL). Liver Function Tests: Recent Labs  Lab 08/26/22 1820 08/27/22 0501  AST 14* 17  ALT 10 8  ALKPHOS 96 92  BILITOT 0.6 0.7  PROT 7.9 7.4  ALBUMIN 2.2* 2.1*   Recent Labs  Lab 08/26/22 1820  LIPASE 31   No results for input(s): "AMMONIA" in the last 168 hours. Coagulation Profile: No results for input(s): "INR", "PROTIME" in the last 168 hours. Cardiac Enzymes: No results for input(s): "CKTOTAL", "CKMB", "CKMBINDEX", "TROPONINI" in the last 168 hours. BNP (last 3 results) No results for input(s): "PROBNP" in the last 8760 hours. HbA1C: No  results for input(s): "HGBA1C" in the last 72 hours. CBG: Recent Labs  Lab 08/26/22 1753  GLUCAP 112*   Lipid Profile: No results for input(s): "CHOL", "HDL", "LDLCALC", "TRIG", "CHOLHDL", "LDLDIRECT" in the last 72 hours. Thyroid Function Tests: No results for input(s): "TSH", "T4TOTAL", "FREET4", "T3FREE", "THYROIDAB" in the last 72 hours. Anemia Panel: No results for input(s): "VITAMINB12", "FOLATE", "FERRITIN", "TIBC", "IRON", "RETICCTPCT" in the last 72 hours. Sepsis Labs: No results for input(s): "PROCALCITON", "LATICACIDVEN" in the last 168 hours.  Recent Results (from the past 240 hour(s))  Resp Panel by RT-PCR (Flu A&B, Covid) Anterior Nasal Swab     Status: None   Collection Time: 08/26/22  6:20 PM   Specimen: Anterior Nasal Swab  Result Value Ref Range Status   SARS Coronavirus 2 by RT PCR NEGATIVE NEGATIVE Final    Comment: (NOTE) SARS-CoV-2 target nucleic acids are NOT DETECTED.  The SARS-CoV-2 RNA is generally detectable in upper respiratory specimens during the acute phase of infection. The lowest concentration of SARS-CoV-2 viral copies this assay can detect is 138 copies/mL. A negative result does not preclude SARS-Cov-2 infection and should not be used as the sole basis for treatment or other patient management decisions. A negative result may occur with  improper specimen collection/handling, submission of specimen other than nasopharyngeal swab, presence of viral mutation(s) within the areas targeted by this assay, and inadequate number of viral copies(<138 copies/mL). A negative result must be combined with clinical observations, patient history, and epidemiological information. The expected result is Negative.  Fact Sheet for Patients:  EntrepreneurPulse.com.au  Fact Sheet for Healthcare Providers:  IncredibleEmployment.be  This test is no t yet approved or cleared by the [place] FDA and  has been authorized  for detection and/or diagnosis of SARS-CoV-2 by FDA under an Emergency Use Authorization (EUA). This EUA will remain  in effect (meaning this test can be used) for the duration of the COVID-19 declaration under Section 564(b)(1) of the Act, 21 U.S.C.section 360bbb-3(b)(1), unless the authorization is terminated  or revoked sooner.       Influenza A by PCR NEGATIVE NEGATIVE Final   Influenza B by PCR NEGATIVE NEGATIVE Final    Comment: (NOTE) The Xpert Xpress SARS-CoV-2/FLU/RSV plus assay is intended as an aid in the diagnosis of influenza from Nasopharyngeal swab specimens and should not be used as a sole basis for treatment. Nasal washings and aspirates are unacceptable for Xpert Xpress SARS-CoV-2/FLU/RSV testing.  Fact Sheet for Patients: EntrepreneurPulse.com.au  Fact Sheet for Healthcare Providers: IncredibleEmployment.be  This test is not yet approved or cleared by the [place] FDA and has been authorized for detection and/or diagnosis of SARS-CoV-2 by FDA under an Emergency Use Authorization (EUA). This EUA will remain in effect (meaning this test can be used) for the duration of the COVID-19 declaration under Section 564(b)(1) of the Act,  21 U.S.C. section 360bbb-3(b)(1), unless the authorization is terminated or revoked.  Performed at [facility], [place] [address] [place], [place] [address]          Radiology Studies: CT Head Wo Contrast  Result Date: 08/26/2022 CLINICAL DATA:  Altered mental status EXAM: CT HEAD WITHOUT CONTRAST TECHNIQUE: Contiguous axial images were obtained from the base of the skull through the vertex without intravenous contrast. RADIATION DOSE REDUCTION: This exam was performed according to the departmental dose-optimization program which includes automated exposure control, adjustment of the mA and/or kV according to patient size and/or use of iterative reconstruction technique. COMPARISON:   07/03/2022 FINDINGS: Brain: No acute intracranial findings are seen. There are no signs of bleeding within the cranium. Dense symmetric calcifications are seen in basal ganglia. Ventricles are not dilated. Cortical sulci are prominent. Vascular: Unremarkable. Skull: Unremarkable. Sinuses/Orbits: Unremarkable. Other: None. IMPRESSION: No acute intracranial findings are seen in noncontrast CT brain. Atrophy. Electronically Signed   By: Elmer Picker M.D.   On: 08/26/2022 19:02   DG Chest Portable 1 View  Result Date: 08/26/2022 CLINICAL DATA:  Pneumonia EXAM: PORTABLE CHEST 1 VIEW COMPARISON:  06/29/2022 FINDINGS: The heart size and mediastinal contours are within normal limits. Both lungs are clear. The visualized skeletal structures are unremarkable. IMPRESSION: No active disease. Electronically Signed   By: Fidela Salisbury M.D.   On: 08/26/2022 18:59        Scheduled Meds:  bictegravir-emtricitabine-tenofovir AF  1 tablet Oral Daily   enoxaparin (LOVENOX) injection  40 mg Subcutaneous Daily   escitalopram  20 mg Oral Daily   feeding supplement  237 mL Oral BID BM   folic acid  1 mg Oral Daily   multivitamin with minerals  1 tablet Oral Daily   pantoprazole  40 mg Oral Daily   thiamine  100 mg Oral Daily   Continuous Infusions:  sodium chloride 75 mL/hr at 08/27/22 1112   cefTRIAXone (ROCEPHIN)  IV 2 g (08/27/22 1115)     LOS: 2 days   Time spent= 35 mins    Kyllie Pettijohn Arsenio Loader, MD [facility]  If 7PM-7AM, please contact night-coverage  08/28/2022, 3:50 AM

## 2022-08-29 DIAGNOSIS — N3000 Acute cystitis without hematuria: Secondary | ICD-10-CM

## 2022-08-29 LAB — MAGNESIUM: Magnesium: 1.7 mg/dL (ref 1.7–2.4)

## 2022-08-29 LAB — BASIC METABOLIC PANEL
Anion gap: 5 (ref 5–15)
BUN: 6 mg/dL (ref 6–20)
CO2: 19 mmol/L — ABNORMAL LOW (ref 22–32)
Calcium: 6.3 mg/dL — CL (ref 8.9–10.3)
Chloride: 113 mmol/L — ABNORMAL HIGH (ref 98–111)
Creatinine, Ser: 1.14 mg/dL (ref 0.61–1.24)
GFR, Estimated: >60 mL/min (ref >60)
Glucose, Bld: 71 mg/dL (ref 70–99)
Potassium: 3 mmol/L — ABNORMAL LOW (ref 3.5–5.1)
Sodium: 137 mmol/L (ref 135–145)

## 2022-08-29 LAB — URINE CULTURE: Culture: >=100000 — AB

## 2022-08-29 LAB — CBC
HCT: 25.2 % — ABNORMAL LOW (ref 39.0–52.0)
Hemoglobin: 7.6 g/dL — ABNORMAL LOW (ref 13.0–17.0)
MCH: 28.6 pg (ref 26.0–34.0)
MCHC: 30.2 g/dL (ref 30.0–36.0)
MCV: 94.7 fL (ref 80.0–100.0)
Platelets: 203 10*3/uL (ref 150–400)
RBC: 2.66 MIL/uL — ABNORMAL LOW (ref 4.22–5.81)
RDW: 14.4 % (ref 11.5–15.5)
WBC: 8.6 10*3/uL (ref 4.0–10.5)
nRBC: 0 % (ref 0.0–0.2)

## 2022-08-29 LAB — T-HELPER CELLS (CD4) COUNT (NOT AT ARMC)
CD4 % Helper T Cell: 54 % (ref 33–65)
CD4 T Cell Abs: 1496 /uL (ref 400–1790)

## 2022-08-29 MED ORDER — SODIUM CHLORIDE 0.9 % IV SOLN: INTRAVENOUS | Status: AC

## 2022-08-29 MED ORDER — HALOPERIDOL 5 MG PO TABS
5.0000 mg | ORAL_TABLET | Freq: Every day | ORAL | Status: DC
  Administered 2022-08-29 – 2022-08-30 (×2): 5 mg via ORAL
  Filled 2022-08-29 (×2): qty 1

## 2022-08-29 MED ORDER — POTASSIUM CHLORIDE 10 MEQ/100ML IV SOLN: 10.0000 meq | INTRAVENOUS | Status: AC

## 2022-08-29 MED ORDER — POTASSIUM CHLORIDE CRYS ER 20 MEQ PO TBCR
40.0000 meq | EXTENDED_RELEASE_TABLET | Freq: Once | ORAL | Status: AC
  Administered 2022-08-29: 40 meq via ORAL
  Filled 2022-08-29: qty 2

## 2022-08-29 MED ORDER — MAGNESIUM SULFATE 4 GM/100ML IV SOLN
4.0000 g | Freq: Once | INTRAVENOUS | Status: AC
  Administered 2022-08-29: 4 g via INTRAVENOUS
  Filled 2022-08-29: qty 100

## 2022-08-29 MED ORDER — VALACYCLOVIR HCL 500 MG PO TABS
1000.0000 mg | ORAL_TABLET | Freq: Every day | ORAL | Status: DC
  Administered 2022-08-29 – 2022-08-30 (×2): 1000 mg via ORAL
  Filled 2022-08-29 (×2): qty 2

## 2022-08-29 MED ORDER — POTASSIUM CHLORIDE 10 MEQ/100ML IV SOLN
10.0000 meq | INTRAVENOUS | Status: AC
  Administered 2022-08-29 (×3): 10 meq via INTRAVENOUS
  Filled 2022-08-29 (×3): qty 100

## 2022-08-29 MED ORDER — CALCIUM GLUCONATE-NACL 2-0.675 GM/100ML-% IV SOLN
2.0000 g | Freq: Once | INTRAVENOUS | Status: AC
  Administered 2022-08-29: 2000 mg via INTRAVENOUS
  Filled 2022-08-29: qty 100

## 2022-08-29 MED ORDER — CALCIUM GLUCONATE-NACL 1-0.675 GM/50ML-% IV SOLN
1.0000 g | Freq: Once | INTRAVENOUS | Status: AC
  Administered 2022-08-29: 1000 mg via INTRAVENOUS
  Filled 2022-08-29: qty 50

## 2022-08-29 MED ORDER — POTASSIUM CHLORIDE 10 MEQ/100ML IV SOLN
INTRAVENOUS | Status: AC
  Administered 2022-08-29: 10 meq via INTRAVENOUS
  Filled 2022-08-29: qty 100

## 2022-08-29 MED ORDER — MIDODRINE HCL 5 MG PO TABS
15.0000 mg | ORAL_TABLET | Freq: Three times a day (TID) | ORAL | Status: DC
  Administered 2022-08-29 – 2022-08-30 (×3): 15 mg via ORAL
  Filled 2022-08-29 (×3): qty 3

## 2022-08-29 NOTE — Consult Note (Signed)
[facility] New Face-to-Face Psychiatric Evaluation   Service Date: August 29, 2022 LOS:  LOS: 3 days    Assessment  [name] is a 36 year old male with a history of BPAD vs schizoaffective disorder, depressed type, PTSD and a medical history of HIV and several previous medical hospitalizations. HE was medically admitted for altered mental status and a complicated UTI on 06/26. Psychiatry was consulted by Dr. Reesa Chew for medication non-compliance  The patient is not an acute danger to himself and does not meet criteria for inpatient psychiatric hospitalization at this time. He has several previous admissions to [facility], last in 2019 for suicidal thoughts. There is no evidence he has recently intentionally harmed himself and he denies recent suicidal thoughts. He reports minor depression due to "being stuck here". He denies recent exacerbations of bipolar disorder. He denies AVH recently. He denies substance use recently and UDS is negative. Restarting home medications, LCSW arranging outpatient appointment for the patient. Psychiatry signing off.   Diagnoses:  Active Hospital problems: Principal Problem:   AMS (altered mental status) Active Problems:   Pressure injury of skin     Plan  ## Safety and Observation Level:  - Based on my clinical evaluation, I estimate the patient to be at low risk of self harm in the current setting - At this time, we recommend a routine level of observation. This decision is based on my review of the chart including patient's history and current presentation, interview of the patient, mental status examination, and consideration of suicide risk including evaluating suicidal ideation, plan, intent, suicidal or self-harm behaviors, risk factors, and protective factors. This judgment is based on our ability to directly address suicide risk, implement suicide prevention strategies and develop a safety plan while the patient is in the clinical  setting. Please contact our team if there is a concern that risk level has changed.   ## Medications:  -- Continue Lexapro 20 mg daily -- Start haldol 5 mg daily  -- Qtc > 500 on this admission, varies between >500 and normal on different admissions. Regardless, the risk is worth the benefit of controlling his hallucination and managing his mood.  -- Hold Latuda for now  ## Medical Decision Making Capacity:  Not assessed  ## Further Work-up:  Per primary  ## Disposition:  -- home   ## Behavioral / Environmental:  -- routine obs  ##Legal Status VOL  Thank you for this consult request. Recommendations have been communicated to the primary team.  We will sign off at this time.   Corky Sox, MD   NEW history  Relevant Aspects of Hospital Course:  Admitted on 08/26/2022 for AMS with UTI.  Patient Report:  On interview today the patient reports that he has been living at home by himself with the help of a home health aide.  He reports that he last saw his psychiatrist at [facility] 3 months ago.  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  He reports that he got his college education in [place] [employer].  He is unsure why he came into the hospital.  Besides this, he is fully alert and oriented and has intact concentration and memory.  ROS:  As above  Collateral information:  None  Psychiatric History:  Information collected from patient, EMR  Family psych history: none   Social History:  As above  Family History:  The patient's family history includes Alcohol abuse in his father, mother, paternal uncle,  and paternal uncle; Depression in his father; Schizophrenia in his father.  Medical History: Past Medical History:  Diagnosis Date   Bipolar 1 disorder ([place])    Depression    Dizziness and giddiness 02/01/2016   Herpes genitalia    HIV disease ([place])    Hypertension    Hyponatremia    Hypothermia 08/24/2021   Migraine headache 02/01/2016   Peripheral  neuropathy 10/01/2019   PTSD (post-traumatic stress disorder)    Schizoaffective disorder ([place])    Seizures ([place])     Surgical History: Past Surgical History:  Procedure Laterality Date   BACK SURGERY     BIOPSY  02/26/2021   Procedure: BIOPSY;  Surgeon: Otis Brace, MD;  Location: [facility];  Service: Gastroenterology;;   COLONOSCOPY WITH PROPOFOL [name] 02/26/2021   Procedure: COLONOSCOPY WITH PROPOFOL;  Surgeon: Otis Brace, MD;  Location: [facility];  Service: Gastroenterology;  Laterality: [name];   ESOPHAGOGASTRODUODENOSCOPY (EGD) WITH PROPOFOL [name] 02/26/2021   Procedure: ESOPHAGOGASTRODUODENOSCOPY (EGD) WITH PROPOFOL;  Surgeon: Otis Brace, MD;  Location: [facility];  Service: Gastroenterology;  Laterality: [name];   ESOPHAGOGASTRODUODENOSCOPY (EGD) WITH PROPOFOL [name] 05/18/2021   Procedure: ESOPHAGOGASTRODUODENOSCOPY (EGD) WITH PROPOFOL;  Surgeon: Otis Brace, MD;  Location: [facility];  Service: Gastroenterology;  Laterality: [name];   HAND SURGERY      Medications:   Current Facility-Administered Medications:    acetaminophen (TYLENOL) tablet 650 mg, 650 mg, Oral, Q6H PRN, Amin, Ankit Chirag, MD, 650 mg at 08/28/22 1950   ALPRAZolam (XANAX) tablet 0.5 mg, 0.5 mg, Oral, TID PRN, Irene Pap N, DO, 0.5 mg at 08/29/22 0029   bictegravir-emtricitabine-tenofovir AF (BIKTARVY) 50-200-25 MG per tablet 1 tablet, 1 tablet, Oral, Daily, Hall, Carole N, DO, 1 tablet at 08/29/22 0942   cefTRIAXone (ROCEPHIN) 2 g in sodium chloride 0.9 % 100 mL IVPB, 2 g, Intravenous, Daily, Hall, Carole N, DO, Last Rate: 200 mL/hr at 08/29/22 0953, 2 g at 08/29/22 0953   enoxaparin (LOVENOX) injection 40 mg, 40 mg, Subcutaneous, Daily, Hall, Carole N, DO, 40 mg at 08/29/22 0942   escitalopram (LEXAPRO) tablet 20 mg, 20 mg, Oral, Daily, Hall, Carole N, DO, 20 mg at 08/29/22 0941   feeding supplement (BOOST / RESOURCE BREEZE) liquid 1 Container, 1 Container, Oral, BID BM, Amin, Jeanella Flattery,  MD, 1 Container at 08/28/22 2309   feeding supplement (ENSURE ENLIVE / ENSURE PLUS) liquid 237 mL, 237 mL, Oral, BID BM, Hall, Carole N, DO, 237 mL at 85/27/78 2423   folic acid (FOLVITE) tablet 1 mg, 1 mg, Oral, Daily, Hall, Carole N, DO, 1 mg at 08/29/22 5361   food thickener (SIMPLYTHICK (NECTAR/LEVEL 2/MILDLY THICK)) 1 packet, 1 packet, Oral, PRN, Hall, Carole N, DO   guaiFENesin (ROBITUSSIN) 100 MG/5ML liquid 5 mL, 5 mL, Oral, Q4H PRN, Amin, Ankit Chirag, MD   haloperidol (HALDOL) tablet 5 mg, 5 mg, Oral, Daily, Amin, Ankit Chirag, MD   hydrALAZINE (APRESOLINE) injection 10 mg, 10 mg, Intravenous, Q4H PRN, Amin, Ankit Chirag, MD   ipratropium-albuterol (DUONEB) 0.5-2.5 (3) MG/3ML nebulizer solution 3 mL, 3 mL, Nebulization, Q4H PRN, Amin, Ankit Chirag, MD   midodrine (PROAMATINE) tablet 15 mg, 15 mg, Oral, TID WC, Amin, Ankit Chirag, MD   multivitamin with minerals tablet 1 tablet, 1 tablet, Oral, Daily, Hall, Carole N, DO, 1 tablet at 08/29/22 0942   nicotine (NICODERM CQ - dosed in mg/24 hours) patch 21 mg, 21 mg, Transdermal, Daily PRN, Wyvonnia Dusky, MD, 21 mg at 08/29/22 0948   ondansetron (ZOFRAN)  injection 4 mg, 4 mg, Intravenous, Q6H PRN, Amin, Ankit Chirag, MD, 4 mg at 08/29/22 1400   pantoprazole (PROTONIX) EC tablet 40 mg, 40 mg, Oral, Daily, Hall, Carole N, DO, 40 mg at 08/29/22 3845   potassium chloride 10 mEq in 100 mL IVPB, 10 mEq, Intravenous, Q1 Hr x 4, Amin, Ankit Chirag, MD   senna-docusate (Senokot-S) tablet 1 tablet, 1 tablet, Oral, QHS PRN, Amin, Ankit Chirag, MD   thiamine (VITAMIN B1) tablet 100 mg, 100 mg, Oral, Daily, Hall, Carole N, DO, 100 mg at 08/29/22 0942   traZODone (DESYREL) tablet 50 mg, 50 mg, Oral, QHS PRN, Amin, Ankit Chirag, MD, 50 mg at 08/29/22 0023   valACYclovir (VALTREX) tablet 1,000 mg, 1,000 mg, Oral, Daily, Amin, Jeanella Flattery, MD  Allergies: Allergies  Allergen Reactions   Aczone [Dapsone] Other (See Comments)    Per [employer], "G6PD  deficient"   Pollen Extract Itching and Other (See Comments)    Sneezing   Primaquine Phosphate Other (See Comments)    Per [employer], "G6PD deficient"       Objective  Vital signs:  Temp:  [97.7 F (36.5 C)-98.4 F (36.9 C)] 98.2 F (36.8 C) (11/20 1225) Pulse Rate:  [106-115] 115 (11/20 1225) Resp:  [13-18] 16 (11/20 1225) BP: (103-119)/(68-83) 105/72 (11/20 1225) SpO2:  [100 %] 100 % (11/20 1225)  Psychiatric Specialty Exam: Physical Exam Constitutional:      Appearance: the patient is not toxic-appearing.  Pulmonary:     Effort: Pulmonary effort is normal.  Neurological:     General: No focal deficit present.     Mental Status: the patient is alert and oriented to person, place, and time.   Review of Systems  Respiratory:  Negative for shortness of breath.   Cardiovascular:  Negative for chest pain.  Gastrointestinal:  Negative for abdominal pain, constipation, diarrhea, nausea and vomiting.  Neurological:  Negative for headaches.      BP 105/72 (BP Location: Right Arm)   Pulse (!) 115   Temp 98.2 F (36.8 C) (Oral)   Resp 16   Ht 6' 3"  (1.905 m)   Wt 67.9 kg   SpO2 100%   BMI 18.71 kg/m   General Appearance: Fairly Groomed  Eye Contact:  Good  Speech:  Clear and Coherent  Volume:  Normal  Mood:  "a little depressed"  Affect:  fairly euthymic  Thought Process:  Coherent  Orientation:  Full (Time, Place, and Person)  Thought Content: Logical   Suicidal Thoughts:  No  Homicidal Thoughts:  No  Memory:  Immediate;   fair  Judgement:  fair  Insight:  fair  Psychomotor Activity:  Normal  Concentration:  Concentration: Good  Recall:  fair  Fund of Knowledge: Good  Language: Good  Akathisia:  No  Handed:    AIMS (if indicated): not done  Assets:  Communication Skills Desire for Improvement Financial Resources/Insurance Housing Leisure Time Physical Health  ADL's:  Intact  Cognition: WNL  Sleep:  Fair    Blood pressure 105/72, pulse (!) 115,  temperature 98.2 F (36.8 C), temperature source Oral, resp. rate 16, height 6' 3"  (1.905 m), weight 67.9 kg, SpO2 100 %. Body mass index is 18.71 kg/m.  Corky Sox, MD PGY-2

## 2022-08-29 NOTE — TOC Initial Note (Signed)
Transition of Care [facility]) - Initial/Assessment Note    Patient Details  Name: [name] MRN: [id number] Date of Birth: 12-Jul-1986  [facility] CM/SW Contact:    Pollie Friar, RN Phone Number: 08/29/2022, 11:28 AM  Clinical Narrative:                 CM met with the patient and he states he lives alone but has a caregiver that comes 7 days a week for 2-3 hours a day. He states she assists with home care things. He states he does the cooking. Then after a few other questions he tells me he needs someone to assist him at home. CM said you just told me you have a caregiver. He then acted like that was new information. He says he had one in the past but not now. DSS informed CM that he does have a current caregiver.  Pt states he uses the bus or medicaid transportation for appointments.  Pt has been managing his own medications and says he tries to take them correctly but is unsure that he is. He is open to having his medications come to the home in bubble packets. CM will call his pharmacy to arrange if plan continues to be for home. Pt states his pharmacy does deliver his medications to his home.  Pt states he doesn't think he has transportation home when medically ready as he doesn't know how he got here.  Awaiting psyche eval.  TOC following.    Expected Discharge Plan: [place] Barriers to Discharge: Continued Medical Work up   Patient Goals and CMS Choice   CMS Medicare.gov Compare Post Acute Care list provided to:: Patient Choice offered to / list presented to : Patient  Expected Discharge Plan and Services Expected Discharge Plan: [place]       Living arrangements for the past 2 months: Apartment                                      Prior Living Arrangements/Services Living arrangements for the past 2 months: Apartment Lives with:: Self Patient language and need for interpreter reviewed:: Yes Do you  feel safe going back to the place where you live?: Yes        Care giver support system in place?: No (comment) Current home services: Homehealth aide Criminal Activity/Legal Involvement Pertinent to Current Situation/Hospitalization: No - Comment as needed  Activities of Daily Living   ADL Screening (condition at time of admission) Patient's cognitive ability adequate to safely complete daily activities?: Yes Is the patient deaf or have difficulty hearing?: No Does the patient have difficulty seeing, even when wearing glasses/contacts?: No Does the patient have difficulty concentrating, remembering, or making decisions?: No Patient able to express need for assistance with ADLs?: Yes Does the patient have difficulty dressing or bathing?: Yes Independently performs ADLs?: No Communication: Independent Dressing (OT): Independent Grooming: Needs assistance Is this a change from baseline?: Pre-admission baseline Feeding: Independent Bathing: Needs assistance Toileting: Needs assistance In/Out Bed: Needs assistance Is this a change from baseline?: Change from baseline, expected to last <3 days Walks in Home: Independent Does the patient have difficulty walking or climbing stairs?: Yes Weakness of Legs: Both Weakness of Arms/Hands: None  Permission Sought/Granted                  Emotional  Assessment Appearance:: Appears stated age Attitude/Demeanor/Rapport: Engaged Affect (typically observed): Other (comment) (some confusion) Orientation: : Oriented to Place, Oriented to Self   Psych Involvement: Yes (comment)  Admission diagnosis:  Hypokalemia [E87.6] Urinary tract infection without hematuria, site unspecified [N39.0] Altered mental status, unspecified altered mental status type [R41.82] AMS (altered mental status) [R41.82] Patient Active Problem List   Diagnosis Date Noted   Pressure injury of skin 08/28/2022   AMS (altered mental status) 08/26/2022   AKI (acute  kidney injury) ([place]) 06/24/2022   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   Noncompliance with medications 02/20/2022   Hyperphosphatemia 24/23/5361   Alcoholic cirrhosis of liver ([place]) 02/17/2022   Chronic alcohol use 02/17/2022   Bipolar disorder ([place]) 02/17/2022   Sepsis ([place]) 02/13/2022   Malnutrition of moderate degree 10/20/2021   Palliative care by specialist    DNR (do not resuscitate) 10/14/2021   Aspiration pneumonia ([place]) 10/14/2021   FTT (failure to thrive) in adult 10/14/2021   Dysphagia 10/14/2021   Coagulopathy ([place]) 10/14/2021   Protein-calorie malnutrition, severe ([place]) 10/09/2021    Class: Chronic   Avascular necrosis of femoral head, left ([place]) 44/31/5400   Alcoholic cirrhosis of liver without ascites ([place]) 10/07/2021   Cellulitis of multiple sites of buttock 10/07/2021   Hypotension    Hypoxia 08/24/2021   Left hip pain    Transaminitis    Acute metabolic encephalopathy 86/76/1950   Cellulitis 05/07/2021   Lymphadenopathy 09/29/2020   Anemia    Upper urinary tract infection    Sepsis secondary to UTI ([place]) 03/14/2020   Cellulitis of groin 03/14/2020   HIV disease ([place])    Macrocytic anemia    Thrombocytopenia (HCC)    Prolonged QT interval    Acute respiratory failure due to COVID-19 ([place]) 10/27/2019   GERD (gastroesophageal reflux disease) 10/27/2019   Hypokalemia 10/27/2019   Peripheral neuropathy 10/01/2019   PTSD (post-traumatic stress disorder) 07/23/2018   Dizziness and giddiness 02/01/2016   Migraine headache 02/01/2016   Schizoaffective disorder, depressive type ([place]) 05/13/2015   Severe alcohol dependence ([place]) 05/13/2015   Suicidal ideation 01/12/2014   PCP:  Merryl Hacker, No Pharmacy:   [place], [place] - [place] [address] Phone: [phone] Fax: [phone]     Social Determinants of Health (SDOH) Interventions    Readmission Risk Interventions    02/15/2022    2:35 PM  08/24/2021    4:00 PM 08/24/2021   11:54 AM  Readmission Risk Prevention Plan  Transportation Screening Complete Complete   PCP or Specialist Appt within 3-5 Days  Complete Complete  HRI or [place]  Complete Complete  Social Work Consult for [place] Planning/Counseling   Complete  Palliative Care Screening  Complete Complete  Medication Review Press photographer) Complete Complete Complete  PCP or Specialist appointment within 3-5 days of discharge Complete    HRI or [place] Complete    SW Recovery Care/Counseling Consult Complete    [place] Not Applicable

## 2022-08-29 NOTE — Progress Notes (Signed)
PROGRESS NOTE    [name]  OBS:[id number] DOB: [birth year] DOA: 08/26/2022 PCP: Pcp, No   Brief Narrative:  36 y.o. male with medical history significant for HIV, schizoaffective disorder, PTSD, bipolar disorder, hypertension, failure to thrive, who presented from home after being found laying in his own urine and feces.  The patient lives alone and has home health aide.  His home aide was on vacation for 3 days, after they returned, they found the patient laying on the couch for an unknown amount of time, laying on his own feces and urine. Work-up revealed complicated UTI and was started on Rocephin empirically.  Urine cultures is growing gram-negative rod, Proteus.   Assessment & Plan:  Principal Problem:   AMS (altered mental status) Active Problems:   Pressure injury of skin    Acute metabolic encephalopathy likely multifactorial secondary to dehydration, presumptive treat UTI.  Improved CT head without contrast was nonacute. Likely from dehydration and UTI. Neurochecks.  Reorient frequently and as needed Fall and aspiration precautions.   Complicated UTI, POA Tachycardia, sinus Follow Cultures, empiric Rocephin.  Urine cultures growing E. coli and Proteus which are pansensitive.   Hypovolemic hyponatremia-resolved Hypokalemia, hypomagnesemia, hypocalcemia IVF, replete Lytes.   Anemia of chronic disease - Initially hemoglobin was 11 but appears to be hemoconcentrated.  Looking at his trend, baseline appears to be around 8.0.  This morning 7.6.  Closely monitor, no obvious signs of bleeding   HIV, on HAART Cont home meds.  Biktarvy   Failure to thrive in adult Dietitian consult Liberalize diet.  On folic acid, multivitamin, thiamine   Chronic anxiety/depression/bipolar disorder/history affective disorder Resume home regimen.  Due to some issue of underlying compliance, I have consulted psychiatry to ensure his condition is stable from mental standpoint.    GERD PPI   DVT prophylaxis: Lovenox Code Status: Full code Family Communication:    Status is: Inpatient Should continue hospital stay until we have further culture data given his immunosuppressive state, awaiting sensitivity data     Subjective: Seen and examined at bedside, does not have any complaints.  Answering all the questions appropriately  Examination: Constitutional: Not in acute distress Respiratory: Clear to auscultation bilaterally Cardiovascular: Normal sinus rhythm, no rubs Abdomen: Nontender nondistended good bowel sounds Musculoskeletal: No edema noted Skin: No rashes seen Neurologic: CN 2-12 grossly intact.  And nonfocal Psychiatric: Normal judgment and insight. Alert and oriented x 3. Normal mood.     Objective: Vitals:   08/28/22 1400 08/28/22 2011 08/29/22 0015 08/29/22 0410  BP: 100/69 109/73 109/79 103/68  Pulse: (!) 110 (!) 109 (!) 109 (!) 106  Resp: 20 16 13 18   Temp: 98.1 F (36.7 C) 98.3 F (36.8 C) 98.2 F (36.8 C) 98.4 F (36.9 C)  TempSrc: Oral Oral Oral Oral  SpO2: 100% 100% 100% 100%  Weight:      Height:        Intake/Output Summary (Last 24 hours) at 08/29/2022 0736 Last data filed at 08/29/2022 0405 Gross per 24 hour  Intake 2942.32 ml  Output 700 ml  Net 2242.32 ml    Filed Weights   08/26/22 1805  Weight: 67.9 kg     Data Reviewed:   CBC: Recent Labs  Lab 08/26/22 1820 08/27/22 0004 08/27/22 0501 08/28/22 0417 08/29/22 0404  WBC 15.9* 13.4* 12.9* 11.9* 8.6  NEUTROABS 11.9*  --  8.7*  --   --   HGB 11.0* 10.0* 11.3* 9.0* 7.6*  HCT 33.7* 32.5* 35.9* 27.7* 25.2*  MCV 90.3 93.1 91.8 91.1 94.7  PLT 310 304 283 233 003   Basic Metabolic Panel: Recent Labs  Lab 08/26/22 1820 08/27/22 0004 08/27/22 0501 08/28/22 0417 08/29/22 0404  NA 132*  --  133* 135 137  K 3.0*  --  3.2* 3.7 3.0*  CL 97*  --  100 105 113*  CO2 23  --  20* 20* 19*  GLUCOSE 106*  --  93 60* 71  BUN 12  --  10 8 6   CREATININE  1.17 1.14 1.13 1.02 1.14  CALCIUM 8.6*  --  8.3* 7.9* 6.3*  MG  --   --  1.5* 1.4* 1.7  PHOS  --   --  2.9  --   --    GFR: Estimated Creatinine Clearance: 84.4 mL/min (by C-G formula based on SCr of 1.14 mg/dL). Liver Function Tests: Recent Labs  Lab 08/26/22 1820 08/27/22 0501  AST 14* 17  ALT 10 8  ALKPHOS 96 92  BILITOT 0.6 0.7  PROT 7.9 7.4  ALBUMIN 2.2* 2.1*   Recent Labs  Lab 08/26/22 1820  LIPASE 31   No results for input(s): "AMMONIA" in the last 168 hours. Coagulation Profile: No results for input(s): "INR", "PROTIME" in the last 168 hours. Cardiac Enzymes: No results for input(s): "CKTOTAL", "CKMB", "CKMBINDEX", "TROPONINI" in the last 168 hours. BNP (last 3 results) No results for input(s): "PROBNP" in the last 8760 hours. HbA1C: No results for input(s): "HGBA1C" in the last 72 hours. CBG: Recent Labs  Lab 08/26/22 1753  GLUCAP 112*   Lipid Profile: No results for input(s): "CHOL", "HDL", "LDLCALC", "TRIG", "CHOLHDL", "LDLDIRECT" in the last 72 hours. Thyroid Function Tests: No results for input(s): "TSH", "T4TOTAL", "FREET4", "T3FREE", "THYROIDAB" in the last 72 hours. Anemia Panel: No results for input(s): "VITAMINB12", "FOLATE", "FERRITIN", "TIBC", "IRON", "RETICCTPCT" in the last 72 hours. Sepsis Labs: No results for input(s): "PROCALCITON", "LATICACIDVEN" in the last 168 hours.  Recent Results (from the past 240 hour(s))  Resp Panel by RT-PCR (Flu A&B, Covid) Anterior Nasal Swab     Status: None   Collection Time: 08/26/22  6:20 PM   Specimen: Anterior Nasal Swab  Result Value Ref Range Status   SARS Coronavirus 2 by RT PCR NEGATIVE NEGATIVE Final    Comment: (NOTE) SARS-CoV-2 target nucleic acids are NOT DETECTED.  The SARS-CoV-2 RNA is generally detectable in upper respiratory specimens during the acute phase of infection. The lowest concentration of SARS-CoV-2 viral copies this assay can detect is 138 copies/mL. A negative result does  not preclude SARS-Cov-2 infection and should not be used as the sole basis for treatment or other patient management decisions. A negative result may occur with  improper specimen collection/handling, submission of specimen other than nasopharyngeal swab, presence of viral mutation(s) within the areas targeted by this assay, and inadequate number of viral copies(<138 copies/mL). A negative result must be combined with clinical observations, patient history, and epidemiological information. The expected result is Negative.  Fact Sheet for Patients:  EntrepreneurPulse.com.au  Fact Sheet for Healthcare Providers:  IncredibleEmployment.be  This test is no t yet approved or cleared by the [place] FDA and  has been authorized for detection and/or diagnosis of SARS-CoV-2 by FDA under an Emergency Use Authorization (EUA). This EUA will remain  in effect (meaning this test can be used) for the duration of the COVID-19 declaration under Section 564(b)(1) of the Act, 21 U.S.C.section 360bbb-3(b)(1), unless the authorization is terminated  or revoked sooner.  Influenza A by PCR NEGATIVE NEGATIVE Final   Influenza B by PCR NEGATIVE NEGATIVE Final    Comment: (NOTE) The Xpert Xpress SARS-CoV-2/FLU/RSV plus assay is intended as an aid in the diagnosis of influenza from Nasopharyngeal swab specimens and should not be used as a sole basis for treatment. Nasal washings and aspirates are unacceptable for Xpert Xpress SARS-CoV-2/FLU/RSV testing.  Fact Sheet for Patients: EntrepreneurPulse.com.au  Fact Sheet for Healthcare Providers: IncredibleEmployment.be  This test is not yet approved or cleared by the [place] FDA and has been authorized for detection and/or diagnosis of SARS-CoV-2 by FDA under an Emergency Use Authorization (EUA). This EUA will remain in effect (meaning this test can be used) for the  duration of the COVID-19 declaration under Section 564(b)(1) of the Act, 21 U.S.C. section 360bbb-3(b)(1), unless the authorization is terminated or revoked.  Performed at [facility], [place] [address], [place], [place] [address]   Urine Culture     Status: Abnormal   Collection Time: 08/26/22 10:50 PM   Specimen: Urine, Clean Catch  Result Value Ref Range Status   Specimen Description URINE, CLEAN CATCH  Final   Special Requests   Final    NONE Performed at [facility], [place] [address], [place], [place] [address]    Culture (A)  Final    >=100,000 COLONIES/mL ESCHERICHIA COLI >=100,000 COLONIES/mL PROTEUS MIRABILIS    Report Status 08/29/2022 FINAL  Final   Organism ID, Bacteria ESCHERICHIA COLI (A)  Final   Organism ID, Bacteria PROTEUS MIRABILIS (A)  Final      Susceptibility   Escherichia coli - MIC*    AMPICILLIN >=32 RESISTANT Resistant     CEFAZOLIN <=4 SENSITIVE Sensitive     CEFEPIME <=0.12 SENSITIVE Sensitive     CEFTRIAXONE <=0.25 SENSITIVE Sensitive     CIPROFLOXACIN <=0.25 SENSITIVE Sensitive     GENTAMICIN <=1 SENSITIVE Sensitive     IMIPENEM <=0.25 SENSITIVE Sensitive     NITROFURANTOIN <=16 SENSITIVE Sensitive     TRIMETH/SULFA <=20 SENSITIVE Sensitive     AMPICILLIN/SULBACTAM 8 SENSITIVE Sensitive     PIP/TAZO <=4 SENSITIVE Sensitive     * >=100,000 COLONIES/mL ESCHERICHIA COLI   Proteus mirabilis - MIC*    AMPICILLIN <=2 SENSITIVE Sensitive     CEFAZOLIN <=4 SENSITIVE Sensitive     CEFEPIME <=0.12 SENSITIVE Sensitive     CEFTRIAXONE <=0.25 SENSITIVE Sensitive     CIPROFLOXACIN <=0.25 SENSITIVE Sensitive     GENTAMICIN <=1 SENSITIVE Sensitive     IMIPENEM 2 SENSITIVE Sensitive     NITROFURANTOIN RESISTANT Resistant     TRIMETH/SULFA <=20 SENSITIVE Sensitive     AMPICILLIN/SULBACTAM <=2 SENSITIVE Sensitive     PIP/TAZO <=4 SENSITIVE Sensitive     * >=100,000 COLONIES/mL PROTEUS MIRABILIS  Culture, blood (Routine X 2) w Reflex to ID Panel      Status: None (Preliminary result)   Collection Time: 08/27/22  5:01 AM   Specimen: BLOOD  Result Value Ref Range Status   Specimen Description BLOOD RIGHT ANTECUBITAL  Final   Special Requests   Final    BOTTLES DRAWN AEROBIC ONLY Blood Culture results may not be optimal due to an inadequate volume of blood received in culture bottles   Culture   Final    NO GROWTH 1 DAY Performed at [place] [address], [place], [place] [address]    Report Status PENDING  Incomplete         Radiology Studies:  No results found.      Scheduled Meds:  bictegravir-emtricitabine-tenofovir AF  1 tablet Oral Daily   enoxaparin (LOVENOX) injection  40 mg Subcutaneous Daily   escitalopram  20 mg Oral Daily   feeding supplement  1 Container Oral BID BM   feeding supplement  237 mL Oral BID BM   folic acid  1 mg Oral Daily   multivitamin with minerals  1 tablet Oral Daily   pantoprazole  40 mg Oral Daily   potassium chloride  40 mEq Oral Once   thiamine  100 mg Oral Daily   Continuous Infusions:  sodium chloride 100 mL/hr at 08/28/22 1950   calcium gluconate 1,000 mg (08/29/22 0658)   calcium gluconate     cefTRIAXone (ROCEPHIN)  IV 2 g (08/28/22 1018)   magnesium sulfate bolus IVPB     potassium chloride       LOS: 3 days   Time spent= 35 mins    Artina Minella Arsenio Loader, MD [facility]  If 7PM-7AM, please contact night-coverage  08/29/2022, 7:36 AM

## 2022-08-29 NOTE — Care Management Important Message (Signed)
Important Message  Patient Details  Name: [name] MRN: [id number] Date of Birth: 12/30/1985   Medicare Important Message Given:  Yes     [name] [address] 08/29/2022, 3:51 PM

## 2022-08-29 NOTE — Progress Notes (Signed)
Critical Lab  08/29/22 0555  Provider Notification  Provider Name/Title Opyd MD  Date Provider Notified 08/29/22  Time Provider Notified [phone]  Method of Notification Page  Notification Reason Critical Result  Test performed and critical result Calcium 6.3  Date Critical Result Received 08/29/22  Time Critical Result Received 0555  Provider response See new orders  Date of Provider Response 08/29/22  Time of Provider Response [phone]

## 2022-08-29 NOTE — Plan of Care (Signed)

## 2022-08-30 ENCOUNTER — Other Ambulatory Visit (HOSPITAL_COMMUNITY): Payer: Self-pay

## 2022-08-30 DIAGNOSIS — R4182 Altered mental status, unspecified: Secondary | ICD-10-CM | POA: Diagnosis not present

## 2022-08-30 LAB — CBC
HCT: 25.8 % — ABNORMAL LOW (ref 39.0–52.0)
Hemoglobin: 8.3 g/dL — ABNORMAL LOW (ref 13.0–17.0)
MCH: 29.9 pg (ref 26.0–34.0)
MCHC: 32.2 g/dL (ref 30.0–36.0)
MCV: 92.8 fL (ref 80.0–100.0)
Platelets: 233 10*3/uL (ref 150–400)
RBC: 2.78 MIL/uL — ABNORMAL LOW (ref 4.22–5.81)
RDW: 14.6 % (ref 11.5–15.5)
WBC: 10.8 10*3/uL — ABNORMAL HIGH (ref 4.0–10.5)
nRBC: 0 % (ref 0.0–0.2)

## 2022-08-30 LAB — BASIC METABOLIC PANEL
Anion gap: 7 (ref 5–15)
BUN: 6 mg/dL (ref 6–20)
CO2: 22 mmol/L (ref 22–32)
Calcium: 8.2 mg/dL — ABNORMAL LOW (ref 8.9–10.3)
Chloride: 105 mmol/L (ref 98–111)
Creatinine, Ser: 1.04 mg/dL (ref 0.61–1.24)
GFR, Estimated: >60 mL/min (ref >60)
Glucose, Bld: 89 mg/dL (ref 70–99)
Potassium: 4.5 mmol/L (ref 3.5–5.1)
Sodium: 134 mmol/L — ABNORMAL LOW (ref 135–145)

## 2022-08-30 LAB — MAGNESIUM: Magnesium: 2 mg/dL (ref 1.7–2.4)

## 2022-08-30 MED ORDER — ALPRAZOLAM 0.5 MG PO TABS
0.5000 mg | ORAL_TABLET | Freq: Three times a day (TID) | ORAL | 0 refills | Status: DC | PRN
  Filled 2022-08-30: qty 30, 10d supply, fill #0

## 2022-08-30 MED ORDER — LURASIDONE HCL 120 MG PO TABS
1.0000 | ORAL_TABLET | Freq: Every day | ORAL | 0 refills | Status: AC
  Filled 2022-08-30 (×2): qty 30, 30d supply, fill #0

## 2022-08-30 MED ORDER — VALACYCLOVIR HCL 1 G PO TABS: 1000.0000 mg | ORAL_TABLET | Freq: Every day | ORAL | 0 refills | Status: AC

## 2022-08-30 MED ORDER — CEPHALEXIN 500 MG PO CAPS
500.0000 mg | ORAL_CAPSULE | Freq: Four times a day (QID) | ORAL | 0 refills | Status: AC
  Filled 2022-08-30: qty 28, 7d supply, fill #0

## 2022-08-30 MED ORDER — ALBUTEROL SULFATE HFA 108 (90 BASE) MCG/ACT IN AERS
1.0000 | INHALATION_SPRAY | Freq: Four times a day (QID) | RESPIRATORY_TRACT | 0 refills | Status: AC | PRN
  Filled 2022-08-30: qty 6.7, 30d supply, fill #0

## 2022-08-30 MED ORDER — SENNOSIDES-DOCUSATE SODIUM 8.6-50 MG PO TABS
1.0000 | ORAL_TABLET | Freq: Every evening | ORAL | 0 refills | Status: AC | PRN
  Filled 2022-08-30: qty 30, 30d supply, fill #0

## 2022-08-30 MED ORDER — BIKTARVY 50-200-25 MG PO TABS
1.0000 | ORAL_TABLET | Freq: Every day | ORAL | 0 refills | Status: AC
  Filled 2022-08-30: qty 30, 30d supply, fill #0

## 2022-08-30 MED ORDER — HALOPERIDOL 5 MG PO TABS
5.0000 mg | ORAL_TABLET | Freq: Every day | ORAL | 0 refills | Status: DC
  Filled 2022-08-30: qty 30, 30d supply, fill #0

## 2022-08-30 MED ORDER — FOLIC ACID 1 MG PO TABS
1.0000 mg | ORAL_TABLET | Freq: Every day | ORAL | 0 refills | Status: AC
  Filled 2022-08-30: qty 30, 30d supply, fill #0

## 2022-08-30 MED ORDER — PANTOPRAZOLE SODIUM 40 MG PO TBEC
40.0000 mg | DELAYED_RELEASE_TABLET | Freq: Every day | ORAL | 0 refills | Status: AC
  Filled 2022-08-30: qty 30, 30d supply, fill #0

## 2022-08-30 MED ORDER — ESCITALOPRAM OXALATE 20 MG PO TABS
20.0000 mg | ORAL_TABLET | Freq: Every day | ORAL | 0 refills | Status: AC
  Filled 2022-08-30: qty 30, 30d supply, fill #0

## 2022-08-30 MED ORDER — THIAMINE HCL 100 MG PO TABS
100.0000 mg | ORAL_TABLET | Freq: Every day | ORAL | 0 refills | Status: AC
  Filled 2022-08-30: qty 30, 30d supply, fill #0

## 2022-08-30 MED ORDER — MIDODRINE HCL 5 MG PO TABS
15.0000 mg | ORAL_TABLET | Freq: Three times a day (TID) | ORAL | 0 refills | Status: AC
  Filled 2022-08-30: qty 240, 27d supply, fill #0

## 2022-08-30 NOTE — Plan of Care (Signed)

## 2022-08-30 NOTE — TOC Transition Note (Signed)
Transition of Care [facility]) - CM/SW Discharge Note   Patient Details  Name: [name] MRN: [id number] Date of Birth: Dec 11, 1985  [facility] CM/SW Contact:  Pollie Friar, RN Phone Number: 08/30/2022, 1:41 PM   Clinical Narrative:    Pt's mother has decided he can come to her home at discharge for a while. She asked that his caregivers see him at her house. CM has called [facility] and they will see patient at [facility] house: [place] in [place].  CM has requested that all his medications be filled through [facility] so pt will have them at his mothers home. TOC pharmacy to deliver medications to the patients room.  CM has changed his appt with his psychiatrist to Nov 29th at 12:30 pm per mother request. She states she can got arrange for him to go to the appointment tomorrow but can get him there next week. Information on the AVS. CM has called pt's pharmacy-[facility] to request bubble packets for him the next time they fill his medications. CM has updated APS on the above.  Pts brother to provide transport to mothers home at 3 pm.    Final next level of care: Home/Self Care Barriers to Discharge: No Barriers Identified   Patient Goals and CMS Choice   CMS Medicare.gov Compare Post Acute Care list provided to:: Patient Choice offered to / list presented to : Patient  Discharge Placement                       Discharge Plan and Services                                     Social Determinants of Health (SDOH) Interventions     Readmission Risk Interventions    02/15/2022    2:35 PM 08/24/2021    4:00 PM 08/24/2021   11:54 AM  Readmission Risk Prevention Plan  Transportation Screening Complete Complete   PCP or Specialist Appt within 3-5 Days  Complete Complete  HRI or [place]  Complete Complete  Social Work Consult for [place] Planning/Counseling   Complete  Palliative Care Screening   Complete Complete  Medication Review Press photographer) Complete Complete Complete  PCP or Specialist appointment within 3-5 days of discharge Complete    HRI or [place] Complete    SW Recovery Care/Counseling Consult Complete    [place] Not Applicable

## 2022-08-30 NOTE — Discharge Summary (Signed)
Physician Discharge Summary  JOHNE BUCKLE QPY:[id number] DOB: Dec 10, 1985 DOA: 08/26/2022  PCP: Pcp, No  Admit date: 08/26/2022 Discharge date: 08/30/2022  Admitted From: Home Disposition:  Home  Recommendations for Outpatient Follow-up:  Follow up with PCP in 1-2 weeks Please obtain BMP/CBC in one week your next doctors visit.  Oral Keflex 4 times daily for 7 days to complete 10-day course Bowel regimen prescribed as needed Xanax has been changed to 0.5 mg 3 times daily as needed He is no longer on linezolid or Augmentin He has outpatient follow-up appointment with psychiatry Dr. Acquanetta Chain on 08/31/2022 at 10:30 AM   Discharge Condition: Stable CODE STATUS: Full code Diet recommendation: Regular  Brief/Interim Summary:  36 y.o. male with medical history significant for HIV, schizoaffective disorder, PTSD, bipolar disorder, hypertension, failure to thrive, who presented from home after being found laying in his own urine and feces.  The patient lives alone and has home health aide.  His home aide was on vacation for 3 days, after they returned, they found the patient laying on the couch for an unknown amount of time, laying on his own feces and urine. Work-up revealed complicated UTI and was started on Rocephin empirically.  Eventually his urine cultures were positive for E. coli and Proteus which were pansensitive therefore IV Rocephin was changed to oral Keflex upon discharge for 7 more days to complete 10-day course. Due to concerns of medication noncompliance and underlying psych issue, psychiatry team was consulted who recommended continuing home medications upon discharge and following up outpatient.  With the help of TOC, outpatient psych follow-up appointment was arranged as mentioned above. Today he is medically stable for discharge     Assessment & Plan:  Principal Problem:   AMS (altered mental status) Active Problems:   Pressure injury of skin     Acute metabolic  encephalopathy likely multifactorial secondary to dehydration, presumptive treat UTI.  Improved CT head without contrast was nonacute.  This is now resolved.  This was likely in the setting of underlying UTI and dehydration   Complicated UTI, POA Tachycardia, sinus Urine cultures are growing E. coli and Proteus which are pansensitive.  Blood cultures remain negative.  We will transition IV Rocephin to oral Keflex upon discharge.  We will do total 7 more days to complete total 10-day course in the setting of immunocompromise state.   Hypovolemic hyponatremia-resolved Hypokalemia, hypomagnesemia, hypocalcemia IVF, replete Lytes.    Anemia of chronic disease - Initially hemoglobin was 11 but appears to be hemoconcentrated.  Looking at his trend, baseline appears to be around 8.0.  Today hemoglobin is 8.3.   HIV, on HAART Cont home meds.  Biktarvy   Failure to thrive in adult Resume his home regimen.   Chronic anxiety/depression/bipolar disorder/history affective disorder Seen by psychiatry.  Recommending to continue his home medications including Lexapro, Haldol and Latuda.  Outpatient psych appointment has been made as mentioned above   GERD PPI     Consultations: Psychiatry  Subjective: Feeling well no complaints  Discharge Exam: Vitals:   08/30/22 0018 08/30/22 0345  BP: (!) 86/69 108/73  Pulse: (!) 106 (!) 110  Resp:  16  Temp:  98 F (36.7 C)  SpO2:  100%   Vitals:   08/29/22 1951 08/29/22 2300 08/30/22 0018 08/30/22 0345  BP: 107/79 (!) 89/62 (!) 86/69 108/73  Pulse: (!) 106 (!) 113 (!) 106 (!) 110  Resp:  17  16  Temp: 98.5 F (36.9 C) 98.7 F (37.1  C)  98 F (36.7 C)  TempSrc: Oral Oral  Oral  SpO2: 100% 100%  100%  Weight:      Height:        General: Pt is alert, awake, not in acute distress Cardiovascular: RRR, S1/S2 +, no rubs, no gallops Respiratory: CTA bilaterally, no wheezing, no rhonchi Abdominal: Soft, NT, ND, bowel sounds + Extremities:  no edema, no cyanosis  Discharge Instructions   Allergies as of 08/30/2022       Reactions   Aczone [dapsone] Other (See Comments)   Per Centricity, "G6PD deficient"   Pollen Extract Itching, Other (See Comments)   Sneezing   Primaquine Phosphate Other (See Comments)   Per Cent[employer]ity, "G6PD deficient"        Medication List     STOP taking these medications    amoxicillin-clavulanate 875-125 MG tablet Commonly known as: AUGMENTIN   food thickener Gel Commonly known as: SIMPLYTHICK (NECTAR/LEVEL 2/MILDLY THICK)   linezolid 600 MG tablet Commonly known as: ZYVOX   nicotine 14 mg/24hr patch Commonly known as: NICODERM CQ - dosed in mg/24 hours       TAKE these medications    acetaminophen 500 MG tablet Commonly known as: TYLENOL Take 1,000 mg by mouth every 6 (six) hours as needed for moderate pain or mild pain.   albuterol 108 (90 Base) MCG/ACT inhaler Commonly known as: VENTOLIN HFA Inhale 1 puff into the lungs every 6 (six) hours as needed for wheezing or shortness of breath.   ALPRAZolam 0.5 MG tablet Commonly known as: XANAX Take 1 tablet (0.5 mg total) by mouth 3 (three) times daily as needed for anxiety. What changed:  medication strength how much to take when to take this reasons to take this   Biktarvy 50-200-25 MG Tabs tablet Generic drug: bictegravir-emtricitabine-tenofovir AF Take 1 tablet by mouth daily.   cephALEXin 500 MG capsule Commonly known as: KEFLEX Take 1 capsule (500 mg total) by mouth 4 (four) times daily for 7 days.   escitalopram 20 MG tablet Commonly known as: LEXAPRO Take 1 tablet by mouth daily.   feeding supplement Liqd Take 237 mLs by mouth 2 (two) times daily between meals.   folic acid 1 MG tablet Commonly known as: FOLVITE Take 1 mg by mouth daily.   guaiFENesin-dextromethorphan 100-10 MG/5ML syrup Commonly known as: ROBITUSSIN DM Take 5 mLs by mouth every 4 (four) hours as needed for cough (chest  congestion).   haloperidol 5 MG tablet Commonly known as: HALDOL Take 5 mg by mouth daily.   ibuprofen 200 MG tablet Commonly known as: ADVIL Take 600 mg by mouth every 6 (six) hours as needed for mild pain or moderate pain.   lip balm ointment Apply topically as needed for lip care.   Lurasidone HCl 120 MG Tabs Take 1 tablet by mouth daily.   midodrine 5 MG tablet Commonly known as: PROAMATINE Take 3 tablets (15 mg total) by mouth 3 (three) times daily with meals.   multivitamin with minerals Tabs tablet Take 1 tablet by mouth daily.   nystatin powder Commonly known as: MYCOSTATIN/NYSTOP Apply topically 2 (two) times daily.   pantoprazole 40 MG tablet Commonly known as: PROTONIX Take 40 mg by mouth daily.   senna-docusate 8.6-50 MG tablet Commonly known as: Senokot-S Take 1 tablet by mouth at bedtime as needed for moderate constipation.   SYSTANE OP Place 1-2 drops into both eyes daily as needed (Dry eyes).   thiamine 100 MG tablet Commonly known as: Vitamin  B-1 Take 1 tablet (100 mg total) by mouth daily.   trolamine salicylate 10 % cream Commonly known as: ASPERCREME Apply 1 Application topically as needed for muscle pain.   valACYclovir 1000 MG tablet Commonly known as: VALTREX Take 1 tablet (1,000 mg total) by mouth daily.        Follow-up Information     Dr Marylyn Ishihara Long Follow up on 08/31/2022.   Why: Your appointment is at 10:30 am. Contact information: [address] [place] [address]  [phone]               Allergies  Allergen Reactions   Aczone [Dapsone] Other (See Comments)    Per Centricity, "G6PD deficient"   Pollen Extract Itching and Other (See Comments)    Sneezing   Primaquine Phosphate Other (See Comments)    Per [employer], "G6PD deficient"    You were cared for by a hospitalist during your hospital stay. If you have any questions about your discharge medications or the care you received while you  were in the hospital after you are discharged, you can call the unit and asked to speak with the hospitalist on call if the hospitalist that took care of you is not available. Once you are discharged, your primary care physician will handle any further medical issues. Please note that no refills for any discharge medications will be authorized once you are discharged, as it is imperative that you return to your primary care physician (or establish a relationship with a primary care physician if you do not have one) for your aftercare needs so that they can reassess your need for medications and monitor your lab values.   Procedures/Studies: CT Head Wo Contrast  Result Date: 08/26/2022 CLINICAL DATA:  Altered mental status EXAM: CT HEAD WITHOUT CONTRAST TECHNIQUE: Contiguous axial images were obtained from the base of the skull through the vertex without intravenous contrast. RADIATION DOSE REDUCTION: This exam was performed according to the departmental dose-optimization program which includes automated exposure control, adjustment of the mA and/or kV according to patient size and/or use of iterative reconstruction technique. COMPARISON:  07/03/2022 FINDINGS: Brain: No acute intracranial findings are seen. There are no signs of bleeding within the cranium. Dense symmetric calcifications are seen in basal ganglia. Ventricles are not dilated. Cortical sulci are prominent. Vascular: Unremarkable. Skull: Unremarkable. Sinuses/Orbits: Unremarkable. Other: None. IMPRESSION: No acute intracranial findings are seen in noncontrast CT brain. Atrophy. Electronically Signed   By: Elmer Picker M.D.   On: 08/26/2022 19:02   DG Chest Portable 1 View  Result Date: 08/26/2022 CLINICAL DATA:  Pneumonia EXAM: PORTABLE CHEST 1 VIEW COMPARISON:  06/29/2022 FINDINGS: The heart size and mediastinal contours are within normal limits. Both lungs are clear. The visualized skeletal structures are unremarkable. IMPRESSION:  No active disease. Electronically Signed   By: Fidela Salisbury M.D.   On: 08/26/2022 18:59     The results of significant diagnostics from this hospitalization (including imaging, microbiology, ancillary and laboratory) are listed below for reference.     Microbiology: Recent Results (from the past 240 hour(s))  Resp Panel by RT-PCR (Flu A&B, Covid) Anterior Nasal Swab     Status: None   Collection Time: 08/26/22  6:20 PM   Specimen: Anterior Nasal Swab  Result Value Ref Range Status   SARS Coronavirus 2 by RT PCR NEGATIVE NEGATIVE Final    Comment: (NOTE) SARS-CoV-2 target nucleic acids are NOT DETECTED.  The SARS-CoV-2 RNA is generally detectable in  upper respiratory specimens during the acute phase of infection. The lowest concentration of SARS-CoV-2 viral copies this assay can detect is 138 copies/mL. A negative result does not preclude SARS-Cov-2 infection and should not be used as the sole basis for treatment or other patient management decisions. A negative result may occur with  improper specimen collection/handling, submission of specimen other than nasopharyngeal swab, presence of viral mutation(s) within the areas targeted by this assay, and inadequate number of viral copies(<138 copies/mL). A negative result must be combined with clinical observations, patient history, and epidemiological information. The expected result is Negative.  Fact Sheet for Patients:  EntrepreneurPulse.com.au  Fact Sheet for Healthcare Providers:  IncredibleEmployment.be  This test is no t yet approved or cleared by the [place] FDA and  has been authorized for detection and/or diagnosis of SARS-CoV-2 by FDA under an Emergency Use Authorization (EUA). This EUA will remain  in effect (meaning this test can be used) for the duration of the COVID-19 declaration under Section 564(b)(1) of the Act, 21 U.S.C.section 360bbb-3(b)(1), unless the authorization  is terminated  or revoked sooner.       Influenza A by PCR NEGATIVE NEGATIVE Final   Influenza B by PCR NEGATIVE NEGATIVE Final    Comment: (NOTE) The Xpert Xpress SARS-CoV-2/FLU/RSV plus assay is intended as an aid in the diagnosis of influenza from Nasopharyngeal swab specimens and should not be used as a sole basis for treatment. Nasal washings and aspirates are unacceptable for Xpert Xpress SARS-CoV-2/FLU/RSV testing.  Fact Sheet for Patients: EntrepreneurPulse.com.au  Fact Sheet for Healthcare Providers: IncredibleEmployment.be  This test is not yet approved or cleared by the [place] FDA and has been authorized for detection and/or diagnosis of SARS-CoV-2 by FDA under an Emergency Use Authorization (EUA). This EUA will remain in effect (meaning this test can be used) for the duration of the COVID-19 declaration under Section 564(b)(1) of the Act, 21 U.S.C. section 360bbb-3(b)(1), unless the authorization is terminated or revoked.  Performed at [facility], [place] [address], [place], [place] [address]   Urine Culture     Status: Abnormal   Collection Time: 08/26/22 10:50 PM   Specimen: Urine, Clean Catch  Result Value Ref Range Status   Specimen Description URINE, CLEAN CATCH  Final   Special Requests   Final    NONE Performed at [facility], [place] [address] [place], [name] [address]    Culture (A)  Final    >=100,000 COLONIES/mL ESCHERICHIA COLI >=100,000 COLONIES/mL PROTEUS MIRABILIS    Report Status 08/29/2022 FINAL  Final   Organism ID, Bacteria ESCHERICHIA COLI (A)  Final   Organism ID, Bacteria PROTEUS MIRABILIS (A)  Final      Susceptibility   Escherichia coli - MIC*    AMPICILLIN >=32 RESISTANT Resistant     CEFAZOLIN <=4 SENSITIVE Sensitive     CEFEPIME <=0.12 SENSITIVE Sensitive     CEFTRIAXONE <=0.25 SENSITIVE Sensitive     CIPROFLOXACIN <=0.25 SENSITIVE Sensitive     GENTAMICIN <=1 SENSITIVE  Sensitive     IMIPENEM <=0.25 SENSITIVE Sensitive     NITROFURANTOIN <=16 SENSITIVE Sensitive     TRIMETH/SULFA <=20 SENSITIVE Sensitive     AMPICILLIN/SULBACTAM 8 SENSITIVE Sensitive     PIP/TAZO <=4 SENSITIVE Sensitive     * >=100,000 COLONIES/mL ESCHERICHIA COLI   Proteus mirabilis - MIC*    AMPICILLIN <=2 SENSITIVE Sensitive     CEFAZOLIN <=4 SENSITIVE Sensitive     CEFEPIME <=0.12 SENSITIVE Sensitive  CEFTRIAXONE <=0.25 SENSITIVE Sensitive     CIPROFLOXACIN <=0.25 SENSITIVE Sensitive     GENTAMICIN <=1 SENSITIVE Sensitive     IMIPENEM 2 SENSITIVE Sensitive     NITROFURANTOIN RESISTANT Resistant     TRIMETH/SULFA <=20 SENSITIVE Sensitive     AMPICILLIN/SULBACTAM <=2 SENSITIVE Sensitive     PIP/TAZO <=4 SENSITIVE Sensitive     * >=100,000 COLONIES/mL PROTEUS MIRABILIS  Culture, blood (Routine X 2) w Reflex to ID Panel     Status: None (Preliminary result)   Collection Time: 08/27/22  5:01 AM   Specimen: BLOOD  Result Value Ref Range Status   Specimen Description BLOOD RIGHT ANTECUBITAL  Final   Special Requests   Final    BOTTLES DRAWN AEROBIC ONLY Blood Culture results may not be optimal due to an inadequate volume of blood received in culture bottles   Culture   Final    NO GROWTH 3 DAYS Performed at [facility], [place] [address] [place], [place] [address]    Report Status PENDING  Incomplete     Labs: BNP (last 3 results) No results for input(s): "BNP" in the last 8760 hours. Basic Metabolic Panel: Recent Labs  Lab 08/26/22 1820 08/27/22 0004 08/27/22 0501 08/28/22 0417 08/29/22 0404 08/30/22 0423  NA 132*  --  133* 135 137 134*  K 3.0*  --  3.2* 3.7 3.0* 4.5  CL 97*  --  100 105 113* 105  CO2 23  --  20* 20* 19* 22  GLUCOSE 106*  --  93 60* 71 89  BUN 12  --  10 8 6 6   CREATININE 1.17 1.14 1.13 1.02 1.14 1.04  CALCIUM 8.6*  --  8.3* 7.9* 6.3* 8.2*  MG  --   --  1.5* 1.4* 1.7 2.0  PHOS  --   --  2.9  --   --   --    Liver Function Tests: Recent  Labs  Lab 08/26/22 1820 08/27/22 0501  AST 14* 17  ALT 10 8  ALKPHOS 96 92  BILITOT 0.6 0.7  PROT 7.9 7.4  ALBUMIN 2.2* 2.1*   Recent Labs  Lab 08/26/22 1820  LIPASE 31   No results for input(s): "AMMONIA" in the last 168 hours. CBC: Recent Labs  Lab 08/26/22 1820 08/27/22 0004 08/27/22 0501 08/28/22 0417 08/29/22 0404 08/30/22 0423  WBC 15.9* 13.4* 12.9* 11.9* 8.6 10.8*  NEUTROABS 11.9*  --  8.7*  --   --   --   HGB 11.0* 10.0* 11.3* 9.0* 7.6* 8.3*  HCT 33.7* 32.5* 35.9* 27.7* 25.2* 25.8*  MCV 90.3 93.1 91.8 91.1 94.7 92.8  PLT 310 304 283 233 203 233   Cardiac Enzymes: No results for input(s): "CKTOTAL", "CKMB", "CKMBINDEX", "TROPONINI" in the last 168 hours. BNP: Invalid input(s): "POCBNP" CBG: Recent Labs  Lab 08/26/22 1753  GLUCAP 112*   D-Dimer No results for input(s): "DDIMER" in the last 72 hours. Hgb A1c No results for input(s): "HGBA1C" in the last 72 hours. Lipid Profile No results for input(s): "CHOL", "HDL", "LDLCALC", "TRIG", "CHOLHDL", "LDLDIRECT" in the last 72 hours. Thyroid function studies No results for input(s): "TSH", "T4TOTAL", "T3FREE", "THYROIDAB" in the last 72 hours.  Invalid input(s): "FREET3" Anemia work up No results for input(s): "VITAMINB12", "FOLATE", "FERRITIN", "TIBC", "IRON", "RETICCTPCT" in the last 72 hours. Urinalysis    Component Value Date/Time   COLORURINE YELLOW 08/26/2022 2214   APPEARANCEUR CLOUDY (A) 08/26/2022 2214   LABSPEC 1.010 08/26/2022 2214   PHURINE  8.5 (H) 08/26/2022 2214   GLUCOSEU NEGATIVE 08/26/2022 2214   HGBUR MODERATE (A) 08/26/2022 2214   BILIRUBINUR SMALL (A) 08/26/2022 2214   KETONESUR 15 (A) 08/26/2022 2214   PROTEINUR >300 (A) 08/26/2022 2214   UROBILINOGEN 0.2 12/21/2014 1006   NITRITE POSITIVE (A) 08/26/2022 2214   LEUKOCYTESUR LARGE (A) 08/26/2022 2214   Sepsis Labs Recent Labs  Lab 08/27/22 0501 08/28/22 0417 08/29/22 0404 08/30/22 0423  WBC 12.9* 11.9* 8.6 10.8*    Microbiology Recent Results (from the past 240 hour(s))  Resp Panel by RT-PCR (Flu A&B, Covid) Anterior Nasal Swab     Status: None   Collection Time: 08/26/22  6:20 PM   Specimen: Anterior Nasal Swab  Result Value Ref Range Status   SARS Coronavirus 2 by RT PCR NEGATIVE NEGATIVE Final    Comment: (NOTE) SARS-CoV-2 target nucleic acids are NOT DETECTED.  The SARS-CoV-2 RNA is generally detectable in upper respiratory specimens during the acute phase of infection. The lowest concentration of SARS-CoV-2 viral copies this assay can detect is 138 copies/mL. A negative result does not preclude SARS-Cov-2 infection and should not be used as the sole basis for treatment or other patient management decisions. A negative result may occur with  improper specimen collection/handling, submission of specimen other than nasopharyngeal swab, presence of viral mutation(s) within the areas targeted by this assay, and inadequate number of viral copies(<138 copies/mL). A negative result must be combined with clinical observations, patient history, and epidemiological information. The expected result is Negative.  Fact Sheet for Patients:  EntrepreneurPulse.com.au  Fact Sheet for Healthcare Providers:  IncredibleEmployment.be  This test is no t yet approved or cleared by the [place] FDA and  has been authorized for detection and/or diagnosis of SARS-CoV-2 by FDA under an Emergency Use Authorization (EUA). This EUA will remain  in effect (meaning this test can be used) for the duration of the COVID-19 declaration under Section 564(b)(1) of the Act, 21 U.S.C.section 360bbb-3(b)(1), unless the authorization is terminated  or revoked sooner.       Influenza A by PCR NEGATIVE NEGATIVE Final   Influenza B by PCR NEGATIVE NEGATIVE Final    Comment: (NOTE) The Xpert Xpress SARS-CoV-2/FLU/RSV plus assay is intended as an aid in the diagnosis of influenza  from Nasopharyngeal swab specimens and should not be used as a sole basis for treatment. Nasal washings and aspirates are unacceptable for Xpert Xpress SARS-CoV-2/FLU/RSV testing.  Fact Sheet for Patients: EntrepreneurPulse.com.au  Fact Sheet for Healthcare Providers: IncredibleEmployment.be  This test is not yet approved or cleared by the [place] FDA and has been authorized for detection and/or diagnosis of SARS-CoV-2 by FDA under an Emergency Use Authorization (EUA). This EUA will remain in effect (meaning this test can be used) for the duration of the COVID-19 declaration under Section 564(b)(1) of the Act, 21 U.S.C. section 360bbb-3(b)(1), unless the authorization is terminated or revoked.  Performed at [facility], [place] [address] [place], [place] [address]   Urine Culture     Status: Abnormal   Collection Time: 08/26/22 10:50 PM   Specimen: Urine, Clean Catch  Result Value Ref Range Status   Specimen Description URINE, CLEAN CATCH  Final   Special Requests   Final    NONE Performed at [facility], [place] [address], [place], [place] [address]    Culture (A)  Final    >=100,000 COLONIES/mL ESCHERICHIA COLI >=100,000 COLONIES/mL PROTEUS MIRABILIS    Report Status 08/29/2022 FINAL  Final   Organism ID, Bacteria ESCHERICHIA COLI (A)  Final   Organism ID, Bacteria PROTEUS MIRABILIS (A)  Final      Susceptibility   Escherichia coli - MIC*    AMPICILLIN >=32 RESISTANT Resistant     CEFAZOLIN <=4 SENSITIVE Sensitive     CEFEPIME <=0.12 SENSITIVE Sensitive     CEFTRIAXONE <=0.25 SENSITIVE Sensitive     CIPROFLOXACIN <=0.25 SENSITIVE Sensitive     GENTAMICIN <=1 SENSITIVE Sensitive     IMIPENEM <=0.25 SENSITIVE Sensitive     NITROFURANTOIN <=16 SENSITIVE Sensitive     TRIMETH/SULFA <=20 SENSITIVE Sensitive     AMPICILLIN/SULBACTAM 8 SENSITIVE Sensitive     PIP/TAZO <=4 SENSITIVE Sensitive     * >=100,000 COLONIES/mL  ESCHERICHIA COLI   Proteus mirabilis - MIC*    AMPICILLIN <=2 SENSITIVE Sensitive     CEFAZOLIN <=4 SENSITIVE Sensitive     CEFEPIME <=0.12 SENSITIVE Sensitive     CEFTRIAXONE <=0.25 SENSITIVE Sensitive     CIPROFLOXACIN <=0.25 SENSITIVE Sensitive     GENTAMICIN <=1 SENSITIVE Sensitive     IMIPENEM 2 SENSITIVE Sensitive     NITROFURANTOIN RESISTANT Resistant     TRIMETH/SULFA <=20 SENSITIVE Sensitive     AMPICILLIN/SULBACTAM <=2 SENSITIVE Sensitive     PIP/TAZO <=4 SENSITIVE Sensitive     * >=100,000 COLONIES/mL PROTEUS MIRABILIS  Culture, blood (Routine X 2) w Reflex to ID Panel     Status: None (Preliminary result)   Collection Time: 08/27/22  5:01 AM   Specimen: BLOOD  Result Value Ref Range Status   Specimen Description BLOOD RIGHT ANTECUBITAL  Final   Special Requests   Final    BOTTLES DRAWN AEROBIC ONLY Blood Culture results may not be optimal due to an inadequate volume of blood received in culture bottles   Culture   Final    NO GROWTH 3 DAYS Performed at [facility], [address], [place], [place] [address]    Report Status PENDING  Incomplete     Time coordinating discharge:  I have spent 35 minutes face to face with the patient and on the ward discussing the patients care, assessment, plan and disposition with other care givers. >50% of the time was devoted counseling the patient about the risks and benefits of treatment/Discharge disposition and coordinating care.   SIGNED:   Damita Lack, MD  [facility] 08/30/2022, 7:48 AM   If 7PM-7AM, please contact night-coverage

## 2022-09-01 LAB — CULTURE, BLOOD (ROUTINE X 2): Culture: NO GROWTH

## 2022-11-25 ENCOUNTER — Emergency Department (HOSPITAL_COMMUNITY)
Admission: EM | Admit: 2022-11-25 | Discharge: 2022-11-26 | Disposition: A | Discharge status: Home / Self Care | Payer: Medicare HMO | Attending: Student | Admitting: Student

## 2022-11-25 ENCOUNTER — Other Ambulatory Visit: Payer: Self-pay

## 2022-11-25 ENCOUNTER — Encounter (HOSPITAL_COMMUNITY): Payer: Self-pay | Admitting: Emergency Medicine

## 2022-11-25 DIAGNOSIS — R627 Adult failure to thrive: Secondary | ICD-10-CM | POA: Diagnosis not present

## 2022-11-25 DIAGNOSIS — L89213 Pressure ulcer of right hip, stage 3: Secondary | ICD-10-CM

## 2022-11-25 DIAGNOSIS — E8809 Other disorders of plasma-protein metabolism, not elsewhere classified: Secondary | ICD-10-CM | POA: Insufficient documentation

## 2022-11-25 DIAGNOSIS — R2689 Other abnormalities of gait and mobility: Secondary | ICD-10-CM | POA: Insufficient documentation

## 2022-11-25 DIAGNOSIS — R262 Difficulty in walking, not elsewhere classified: Secondary | ICD-10-CM

## 2022-11-25 DIAGNOSIS — L89159 Pressure ulcer of sacral region, unspecified stage: Secondary | ICD-10-CM | POA: Diagnosis present

## 2022-11-25 LAB — URINALYSIS, ROUTINE W REFLEX MICROSCOPIC
Bilirubin Urine: NEGATIVE
Glucose, UA: NEGATIVE mg/dL
Ketones, ur: NEGATIVE mg/dL
Leukocytes,Ua: NEGATIVE
Nitrite: NEGATIVE
Protein, ur: NEGATIVE mg/dL
Specific Gravity, Urine: 1.014 (ref 1.005–1.030)
pH: 7 (ref 5.0–8.0)

## 2022-11-25 LAB — COMPREHENSIVE METABOLIC PANEL
ALT: 6 U/L (ref 0–44)
AST: 13 U/L — ABNORMAL LOW (ref 15–41)
Albumin: 2.4 g/dL — ABNORMAL LOW (ref 3.5–5.0)
Alkaline Phosphatase: 75 U/L (ref 38–126)
Anion gap: 10 (ref 5–15)
BUN: 12 mg/dL (ref 6–20)
CO2: 28 mmol/L (ref 22–32)
Calcium: 8.9 mg/dL (ref 8.9–10.3)
Chloride: 96 mmol/L — ABNORMAL LOW (ref 98–111)
Creatinine, Ser: 1.07 mg/dL (ref 0.61–1.24)
GFR, Estimated: >60 mL/min (ref >60)
Glucose, Bld: 81 mg/dL (ref 70–99)
Potassium: 3.7 mmol/L (ref 3.5–5.1)
Sodium: 134 mmol/L — ABNORMAL LOW (ref 135–145)
Total Bilirubin: 0.6 mg/dL (ref 0.3–1.2)
Total Protein: 8.2 g/dL — ABNORMAL HIGH (ref 6.5–8.1)

## 2022-11-25 LAB — CBC WITH DIFFERENTIAL/PLATELET
Abs Immature Granulocytes: 0.03 10*3/uL (ref 0.00–0.07)
Basophils Absolute: 0.1 10*3/uL (ref 0.0–0.1)
Basophils Relative: 1 %
Eosinophils Absolute: 0.1 10*3/uL (ref 0.0–0.5)
Eosinophils Relative: 2 %
HCT: 26.5 % — ABNORMAL LOW (ref 39.0–52.0)
Hemoglobin: 7.9 g/dL — ABNORMAL LOW (ref 13.0–17.0)
Immature Granulocytes: 0 %
Lymphocytes Relative: 34 %
Lymphs Abs: 3.2 10*3/uL (ref 0.7–4.0)
MCH: 28.3 pg (ref 26.0–34.0)
MCHC: 29.8 g/dL — ABNORMAL LOW (ref 30.0–36.0)
MCV: 95 fL (ref 80.0–100.0)
Monocytes Absolute: 0.9 10*3/uL (ref 0.1–1.0)
Monocytes Relative: 10 %
Neutro Abs: 5 10*3/uL (ref 1.7–7.7)
Neutrophils Relative %: 53 %
Platelets: 315 10*3/uL (ref 150–400)
RBC: 2.79 MIL/uL — ABNORMAL LOW (ref 4.22–5.81)
RDW: 14.6 % (ref 11.5–15.5)
WBC: 9.3 10*3/uL (ref 4.0–10.5)
nRBC: 0 % (ref 0.0–0.2)

## 2022-11-25 MED ORDER — FENTANYL CITRATE PF 50 MCG/ML IJ SOSY
25.0000 ug | PREFILLED_SYRINGE | Freq: Once | INTRAMUSCULAR | Status: AC
  Administered 2022-11-25: 25 ug via INTRAVENOUS
  Filled 2022-11-25: qty 1

## 2022-11-25 MED ORDER — ENSURE ENLIVE PO LIQD
237.0000 mL | Freq: Two times a day (BID) | ORAL | Status: DC
  Administered 2022-11-25: 237 mL via ORAL
  Filled 2022-11-25 (×3): qty 237

## 2022-11-25 MED ORDER — ESCITALOPRAM OXALATE 10 MG PO TABS
20.0000 mg | ORAL_TABLET | Freq: Every day | ORAL | Status: DC
  Administered 2022-11-25: 20 mg via ORAL
  Filled 2022-11-25: qty 2

## 2022-11-25 MED ORDER — PANTOPRAZOLE SODIUM 40 MG PO TBEC
40.0000 mg | DELAYED_RELEASE_TABLET | Freq: Every day | ORAL | Status: DC
  Administered 2022-11-25: 40 mg via ORAL
  Filled 2022-11-25: qty 1

## 2022-11-25 MED ORDER — FAMOTIDINE 20 MG PO TABS: 40.0000 mg | ORAL_TABLET | Freq: Every morning | ORAL | Status: DC

## 2022-11-25 MED ORDER — ALBUMIN HUMAN 25 % IV SOLN
25.0000 g | Freq: Once | INTRAVENOUS | Status: AC
  Administered 2022-11-25: 12.5 g via INTRAVENOUS
  Filled 2022-11-25: qty 100

## 2022-11-25 MED ORDER — FOLIC ACID 1 MG PO TABS
1.0000 mg | ORAL_TABLET | Freq: Every day | ORAL | Status: DC
  Administered 2022-11-25: 1 mg via ORAL
  Filled 2022-11-25: qty 1

## 2022-11-25 MED ORDER — HYDROCODONE-ACETAMINOPHEN 5-325 MG PO TABS
1.0000 | ORAL_TABLET | Freq: Two times a day (BID) | ORAL | Status: DC | PRN
  Administered 2022-11-25: 1 via ORAL
  Filled 2022-11-25: qty 1

## 2022-11-25 MED ORDER — LORAZEPAM 1 MG PO TABS
1.0000 mg | ORAL_TABLET | Freq: Once | ORAL | Status: AC
  Administered 2022-11-25: 1 mg via ORAL
  Filled 2022-11-25: qty 1

## 2022-11-25 MED ORDER — THIAMINE MONONITRATE 100 MG PO TABS
100.0000 mg | ORAL_TABLET | Freq: Every day | ORAL | Status: DC
  Administered 2022-11-25: 100 mg via ORAL
  Filled 2022-11-25: qty 1

## 2022-11-25 MED ORDER — ALPRAZOLAM 0.5 MG PO TABS
0.5000 mg | ORAL_TABLET | Freq: Three times a day (TID) | ORAL | Status: DC | PRN
  Administered 2022-11-25 – 2022-11-26 (×2): 0.5 mg via ORAL
  Filled 2022-11-25 (×2): qty 1

## 2022-11-25 MED ORDER — ALBUTEROL SULFATE (2.5 MG/3ML) 0.083% IN NEBU: 2.5000 mg | INHALATION_SOLUTION | Freq: Four times a day (QID) | RESPIRATORY_TRACT | Status: DC | PRN

## 2022-11-25 MED ORDER — TRAZODONE HCL 50 MG PO TABS
300.0000 mg | ORAL_TABLET | Freq: Every day | ORAL | Status: DC
  Administered 2022-11-25: 300 mg via ORAL
  Filled 2022-11-25: qty 6

## 2022-11-25 MED ORDER — BICTEGRAVIR-EMTRICITAB-TENOFOV 50-200-25 MG PO TABS
1.0000 | ORAL_TABLET | Freq: Every day | ORAL | Status: DC
  Administered 2022-11-25: 1 via ORAL
  Filled 2022-11-25 (×4): qty 1

## 2022-11-25 MED ORDER — HALOPERIDOL 5 MG PO TABS
5.0000 mg | ORAL_TABLET | Freq: Every day | ORAL | Status: DC
  Administered 2022-11-25: 5 mg via ORAL
  Filled 2022-11-25: qty 1

## 2022-11-25 MED ORDER — ACETAMINOPHEN 500 MG PO TABS: 1000.0000 mg | ORAL_TABLET | Freq: Four times a day (QID) | ORAL | Status: DC | PRN

## 2022-11-25 MED ORDER — ALBUTEROL SULFATE HFA 108 (90 BASE) MCG/ACT IN AERS: 1.0000 | INHALATION_SPRAY | Freq: Four times a day (QID) | RESPIRATORY_TRACT | Status: DC | PRN

## 2022-11-25 MED ORDER — VALACYCLOVIR HCL 500 MG PO TABS
1000.0000 mg | ORAL_TABLET | Freq: Every day | ORAL | Status: DC
  Administered 2022-11-25: 1000 mg via ORAL
  Filled 2022-11-25: qty 2

## 2022-11-25 NOTE — ED Notes (Signed)
Pt requesting xanax, informed medication is not due at this time, however trazodone is. Pt also concerned that bp is lower, cuff noted to be lose on forearm. Cuff repositioned to R upper arm with improvement of bp

## 2022-11-25 NOTE — Progress Notes (Signed)
CSW has set up home health. Nanine Means will come see them tomorrow. Nanine Means will follow up with the family ASAP. The mother has been made aware of the [place], At this time [facility] is signing off.

## 2022-11-25 NOTE — ED Triage Notes (Signed)
Pts care giver reported clear drainage from rt eye. Upon EMS assessment, a bleeding pressure injury was found on his bottom.

## 2022-11-25 NOTE — ED Notes (Signed)
Spoke to mother, who states she is not able to get patient in the house. She was told an ambulance would transport the patient

## 2022-11-25 NOTE — ED Provider Notes (Signed)
[place] [facility] Provider Note   CSN: [id number] Arrival date & time: 11/25/22  1048     History  Chief Complaint  Patient presents with   Skin Ulcer    "Pressure injury"    [name] is a 37 y.o. male.  Brought in by EMS today for bleeding ulcers to the sacrum.  Patient has history of schizophrenia.  He is not a reliable historian and states he feels like things are crawling all over him and something is not right with his body.  He reports pain all over that he states is chronic.  Much history is taken from his mother who is his legal guardian.  He has been living with her since hospital discharge in November.  She states that wound care will no longer take care of him as he does not participate in care, he currently does not have any services at home.  She reports that he eats well some days, will other days not eat at all.  He has not been able to ambulate independently since leaving the hospital in November.  He has chronic swelling in his legs that has not changed.  Mother states that he takes his medications but not every day sometimes he refuses.  He has not had any fevers or chills, no other complaints. HPI     Home Medications Prior to Admission medications   Medication Sig Start Date End Date Taking? Authorizing Provider  acetaminophen (TYLENOL) 500 MG tablet Take 1,000 mg by mouth every 6 (six) hours as needed for moderate pain or mild pain.   Yes [provider]  albuterol (VENTOLIN HFA) 108 (90 Base) MCG/ACT inhaler Inhale 1 puff into the lungs every 6 (six) hours as needed for wheezing or shortness of breath. 08/30/22 11/25/22 Yes Amin, Jeanella Flattery, MD  ALPRAZolam Duanne Moron) 0.5 MG tablet Take 1 tablet (0.5 mg total) by mouth 3 (three) times daily as needed for anxiety. 08/30/22  Yes Amin, Jeanella Flattery, MD  cyclobenzaprine (FLEXERIL) 10 MG tablet Take 10 mg by mouth 3 (three) times daily as needed. 11/18/22  Yes [provider]  diphenoxylate-atropine (LOMOTIL) 2.5-0.025 MG tablet Take 1 tablet by mouth daily. 11/18/22  Yes [provider]  famotidine (PEPCID) 40 MG tablet Take 40 mg by mouth every morning. 11/18/22  Yes [provider]  furosemide (LASIX) 40 MG tablet Take 40 mg by mouth daily as needed. 11/18/22  Yes [provider]  HYDROcodone-acetaminophen (NORCO/VICODIN) 5-325 MG tablet Take 1 tablet by mouth 2 (two) times daily as needed. 11/18/22  Yes [provider]  ondansetron (ZOFRAN) 8 MG tablet Take 8 mg by mouth every 8 (eight) hours as needed. 11/18/22  Yes [provider]  temazepam (RESTORIL) 30 MG capsule Take 30 mg by mouth at bedtime. 10/11/22  Yes [provider]  trazodone (DESYREL) 300 MG tablet Take 300 mg by mouth at bedtime. 11/18/22  Yes [provider]  bictegravir-emtricitabine-tenofovir AF (BIKTARVY) 50-200-25 MG TABS tablet Take 1 tablet by mouth daily. Patient not taking: Reported on 11/25/2022 08/30/22   Damita Lack, MD  escitalopram (LEXAPRO) 20 MG tablet Take 1 tablet (20 mg total) by mouth daily. 08/30/22   Amin, Jeanella Flattery, MD  feeding supplement (ENSURE ENLIVE / ENSURE PLUS) LIQD Take 237 mLs by mouth 2 (two) times daily between meals. 03/04/22   Florencia Reasons, MD  folic acid (FOLVITE) 1 MG tablet Take 1 tablet (1 mg total) by  mouth daily. 08/30/22   Amin, Jeanella Flattery, MD  haloperidol (HALDOL) 5 MG tablet Take 1 tablet (5 mg total) by mouth daily. 08/30/22   Amin, Jeanella Flattery, MD  ibuprofen (ADVIL) 200 MG tablet Take 600 mg by mouth every 6 (six) hours as needed for mild pain or moderate pain.    [provider]  lip balm (CARMEX) ointment Apply topically as needed for lip care. 07/07/22   Allie Bossier, MD  Lurasidone HCl 120 MG TABS Take 1 tablet (120 mg total) by mouth daily. 08/30/22   Amin, Jeanella Flattery, MD  Multiple Vitamin (MULTIVITAMIN WITH MINERALS) TABS tablet Take 1 tablet by mouth daily. 09/01/21    Raiford Noble Latif, DO  nystatin (MYCOSTATIN/NYSTOP) powder Apply topically 2 (two) times daily. 07/07/22   Allie Bossier, MD  pantoprazole (PROTONIX) 40 MG tablet Take 1 tablet (40 mg total) by mouth daily. 08/30/22   Amin, Jeanella Flattery, MD  Polyethyl Glycol-Propyl Glycol (SYSTANE OP) Place 1-2 drops into both eyes daily as needed (Dry eyes).    [provider]  senna-docusate (SENOKOT-S) 8.6-50 MG tablet Take 1 tablet by mouth at bedtime as needed for moderate constipation. 08/30/22   Amin, Jeanella Flattery, MD  thiamine (VITAMIN B1) 100 MG tablet Take 1 tablet (100 mg total) by mouth daily. 08/30/22   Amin, Jeanella Flattery, MD  trolamine salicylate (ASPERCREME) 10 % cream Apply 1 Application topically as needed for muscle pain.    [provider]  valACYclovir (VALTREX) 1000 MG tablet Take 1 tablet (1,000 mg total) by mouth daily. 08/30/22   Damita Lack, MD      Allergies    Aczone [dapsone], Pollen extract, and Primaquine phosphate    Review of Systems   Review of Systems  Physical Exam Updated Vital Signs BP 116/79   Pulse 100   Temp 98.1 F (36.7 C) (Oral)   Resp 17   Ht 6' 3"$  (1.905 m)   SpO2 100%   BMI 18.71 kg/m  Physical Exam Vitals and nursing note reviewed.  Constitutional:      General: He is not in acute distress.    Appearance: He is well-developed.  HENT:     Head: Normocephalic and atraumatic.  Eyes:     Conjunctiva/sclera: Conjunctivae normal.  Cardiovascular:     Rate and Rhythm: Normal rate and regular rhythm.     Heart sounds: No murmur heard. Pulmonary:     Effort: Pulmonary effort is normal. No respiratory distress.     Breath sounds: Normal breath sounds.  Abdominal:     Palpations: Abdomen is soft.     Tenderness: There is no abdominal tenderness.  Musculoskeletal:        General: No swelling.     Cervical back: Neck supple.     Right lower leg: 3+ Edema present.     Left lower leg: 3+ Edema present.  Skin:    General:  Skin is warm and dry.     Capillary Refill: Capillary refill takes less than 2 seconds.     Findings: Wound present.  Neurological:     Mental Status: He is alert.  Psychiatric:        Mood and Affect: Mood normal.      Ulceration noted to right lateral hip as above no surrounding cellulitis and no exudate.  Mepilex placed to this by RN Skin excoriated skin with scant areas of open skin with some bleeding noted to bilateral inner and posterior thighs with  no cellulitis, no warmth and no drainage.  Skin of the scrotum looks similar.  No signs of cellulitis specifically no sign of Fournier's gangrene  ED Results / Procedures / Treatments   Labs (all labs ordered are listed, but only abnormal results are displayed) Labs Reviewed  CBC WITH DIFFERENTIAL/PLATELET - Abnormal; Notable for the following components:      Result Value   RBC 2.79 (*)    Hemoglobin 7.9 (*)    HCT 26.5 (*)    MCHC 29.8 (*)    All other components within normal limits  COMPREHENSIVE METABOLIC PANEL - Abnormal; Notable for the following components:   Sodium 134 (*)    Chloride 96 (*)    Total Protein 8.2 (*)    Albumin 2.4 (*)    AST 13 (*)    All other components within normal limits  URINALYSIS, ROUTINE W REFLEX MICROSCOPIC    EKG None  Radiology No results found.  Procedures Procedures    Medications Ordered in ED Medications  acetaminophen (TYLENOL) tablet 1,000 mg (has no administration in time range)  bictegravir-emtricitabine-tenofovir AF (BIKTARVY) 50-200-25 MG per tablet 1 tablet (has no administration in time range)  ALPRAZolam (XANAX) tablet 0.5 mg (has no administration in time range)  escitalopram (LEXAPRO) tablet 20 mg (has no administration in time range)  famotidine (PEPCID) tablet 40 mg (has no administration in time range)  feeding supplement (ENSURE ENLIVE / ENSURE PLUS) liquid 237 mL (has no administration in time range)  folic acid (FOLVITE) tablet 1 mg (has no  administration in time range)  haloperidol (HALDOL) tablet 5 mg (has no administration in time range)  HYDROcodone-acetaminophen (NORCO/VICODIN) 5-325 MG per tablet 1 tablet (has no administration in time range)  pantoprazole (PROTONIX) EC tablet 40 mg (has no administration in time range)  thiamine (VITAMIN B1) tablet 100 mg (has no administration in time range)  traZODone (DESYREL) tablet 300 mg (has no administration in time range)  valACYclovir (VALTREX) tablet 1,000 mg (has no administration in time range)  albuterol (PROVENTIL) (2.5 MG/3ML) 0.083% nebulizer solution 2.5 mg (has no administration in time range)  fentaNYL (SUBLIMAZE) injection 25 mcg (25 mcg Intravenous Given 11/25/22 1220)  LORazepam (ATIVAN) tablet 1 mg (1 mg Oral Given 11/25/22 1300)  albumin human 25 % solution 25 g (12.5 g Intravenous New Bag/Given 11/25/22 1545)    ED Course/ Medical Decision Making/ A&P                             Medical Decision Making Amount and/or Complexity of Data Reviewed Labs: ordered.  Risk OTC drugs. Prescription drug management.   This patient presents to the ED for concern of Skin ulcer, this involves an extensive number of treatment options, and is a complaint that carries with it a high risk of complications and morbidity.  The differential diagnosis includes ulcer, cellulitis, sepsis, yeast dermatitis, other   Co morbidities that complicate the patient evaluation  HIV, schizophrenia   Additional history obtained:  Additional history obtained from EMR External records from outside source obtained and reviewed including Hospital discharge summary   Lab Tests:  I Ordered, and personally interpreted labs.  The pertinent results include:  Anemia, at baseline, low albumin at baseline, no leukocytosis     Consultations Obtained:  I requested consultation with the Hospitalist, Dr. Dyann Kief,  and discussed lab and imaging findings as well as pertinent plan - they  recommend: Consult social work  Problem List / ED Course / Critical interventions / Medication management  Hypoalbuminemia, Failure to thrive, ambulatory dysfunction, ulcer right hip  I ordered medication including fentanyl  for pain  Reevaluation of the patient after these medicines showed that the patient improved I have reviewed the patients home medicines and have made adjustments as needed   Social Determinants of Health:  schizophrenia    Awaiting Social work consult at this time. His mother wants to be kept updated. She would be willing to take him home if he has services to help.   Signed out to oncoming team pending dispo        Final Clinical Impression(s) / ED Diagnoses Final diagnoses:  Pressure injury of right hip, stage 3 ([place])  Ambulatory dysfunction  Failure to thrive in adult    Rx / DC Orders ED Discharge Orders     None         Darci Current 11/25/22 1618    [name], MD 11/25/22 1901

## 2022-11-25 NOTE — Discharge Instructions (Addendum)
Follow-up as directed with home health services, keep the wounds clean and dry and come back for worsening symptoms especially redness, fever or drainage from the wounds,   home health with [facility] is set back up

## 2022-11-26 DIAGNOSIS — L89213 Pressure ulcer of right hip, stage 3: Secondary | ICD-10-CM | POA: Diagnosis not present

## 2022-11-26 NOTE — ED Notes (Signed)
Pt concerned about being discharged and that his blood pressure is low (blood pressure readings have been normal since repositioning of cuff). Pt requesting to speak to provider, explained his provider is off shift now. Phone provided for pt to call mother per his request

## 2022-12-03 ENCOUNTER — Emergency Department (HOSPITAL_COMMUNITY)
Admission: EM | Admit: 2022-12-03 | Discharge: 2022-12-03 | Disposition: A | Discharge status: Home / Self Care | Payer: Medicare HMO | Source: Home / Self Care | Attending: Emergency Medicine | Admitting: Emergency Medicine

## 2022-12-03 ENCOUNTER — Encounter (HOSPITAL_COMMUNITY): Payer: Self-pay

## 2022-12-03 ENCOUNTER — Other Ambulatory Visit: Payer: Self-pay

## 2022-12-03 ENCOUNTER — Encounter (HOSPITAL_COMMUNITY): Payer: Self-pay | Admitting: *Deleted

## 2022-12-03 DIAGNOSIS — L89899 Pressure ulcer of other site, unspecified stage: Secondary | ICD-10-CM | POA: Insufficient documentation

## 2022-12-03 DIAGNOSIS — R000 Tachycardia, unspecified: Secondary | ICD-10-CM | POA: Insufficient documentation

## 2022-12-03 DIAGNOSIS — K6289 Other specified diseases of anus and rectum: Secondary | ICD-10-CM | POA: Diagnosis not present

## 2022-12-03 DIAGNOSIS — R5383 Other fatigue: Secondary | ICD-10-CM | POA: Insufficient documentation

## 2022-12-03 DIAGNOSIS — B20 Human immunodeficiency virus [HIV] disease: Secondary | ICD-10-CM | POA: Insufficient documentation

## 2022-12-03 DIAGNOSIS — R627 Adult failure to thrive: Secondary | ICD-10-CM

## 2022-12-03 DIAGNOSIS — W19XXXA Unspecified fall, initial encounter: Secondary | ICD-10-CM

## 2022-12-03 DIAGNOSIS — N390 Urinary tract infection, site not specified: Secondary | ICD-10-CM | POA: Diagnosis not present

## 2022-12-03 DIAGNOSIS — I10 Essential (primary) hypertension: Secondary | ICD-10-CM | POA: Insufficient documentation

## 2022-12-03 DIAGNOSIS — W07XXXA Fall from chair, initial encounter: Secondary | ICD-10-CM | POA: Insufficient documentation

## 2022-12-03 HISTORY — DX: Adult failure to thrive: R62.7

## 2022-12-03 LAB — COMPREHENSIVE METABOLIC PANEL
ALT: 8 U/L (ref 0–44)
AST: 22 U/L (ref 15–41)
Albumin: 2.6 g/dL — ABNORMAL LOW (ref 3.5–5.0)
Alkaline Phosphatase: 99 U/L (ref 38–126)
Anion gap: 13 (ref 5–15)
BUN: 14 mg/dL (ref 6–20)
CO2: 25 mmol/L (ref 22–32)
Calcium: 9.6 mg/dL (ref 8.9–10.3)
Chloride: 96 mmol/L — ABNORMAL LOW (ref 98–111)
Creatinine, Ser: 0.98 mg/dL (ref 0.61–1.24)
GFR, Estimated: >60 mL/min (ref >60)
Glucose, Bld: 125 mg/dL — ABNORMAL HIGH (ref 70–99)
Potassium: 4 mmol/L (ref 3.5–5.1)
Sodium: 134 mmol/L — ABNORMAL LOW (ref 135–145)
Total Bilirubin: 0.3 mg/dL (ref 0.3–1.2)
Total Protein: 9.1 g/dL — ABNORMAL HIGH (ref 6.5–8.1)

## 2022-12-03 LAB — CBC WITH DIFFERENTIAL/PLATELET
Abs Immature Granulocytes: 0.05 10*3/uL (ref 0.00–0.07)
Basophils Absolute: 0.1 10*3/uL (ref 0.0–0.1)
Basophils Relative: 0 %
Eosinophils Absolute: 0 10*3/uL (ref 0.0–0.5)
Eosinophils Relative: 0 %
HCT: 27.8 % — ABNORMAL LOW (ref 39.0–52.0)
Hemoglobin: 8.5 g/dL — ABNORMAL LOW (ref 13.0–17.0)
Immature Granulocytes: 0 %
Lymphocytes Relative: 15 %
Lymphs Abs: 1.9 10*3/uL (ref 0.7–4.0)
MCH: 28.5 pg (ref 26.0–34.0)
MCHC: 30.6 g/dL (ref 30.0–36.0)
MCV: 93.3 fL (ref 80.0–100.0)
Monocytes Absolute: 0.9 10*3/uL (ref 0.1–1.0)
Monocytes Relative: 7 %
Neutro Abs: 9.6 10*3/uL — ABNORMAL HIGH (ref 1.7–7.7)
Neutrophils Relative %: 78 %
Platelets: 469 10*3/uL — ABNORMAL HIGH (ref 150–400)
RBC: 2.98 MIL/uL — ABNORMAL LOW (ref 4.22–5.81)
RDW: 14.2 % (ref 11.5–15.5)
WBC: 12.5 10*3/uL — ABNORMAL HIGH (ref 4.0–10.5)
nRBC: 0 % (ref 0.0–0.2)

## 2022-12-03 LAB — CK: Total CK: 194 U/L (ref 49–397)

## 2022-12-03 MED ORDER — LORAZEPAM 1 MG PO TABS: 1.0000 mg | ORAL_TABLET | Freq: Once | ORAL | Status: DC

## 2022-12-03 MED ORDER — LACTATED RINGERS IV BOLUS
1000.0000 mL | Freq: Once | INTRAVENOUS | Status: AC
  Administered 2022-12-03: 1000 mL via INTRAVENOUS

## 2022-12-03 MED ORDER — HYDROCODONE-ACETAMINOPHEN 5-325 MG PO TABS
1.0000 | ORAL_TABLET | Freq: Once | ORAL | Status: AC
  Administered 2022-12-03: 1 via ORAL
  Filled 2022-12-03: qty 1

## 2022-12-03 NOTE — ED Notes (Signed)
[facility] here to transport pt home.

## 2022-12-03 NOTE — Discharge Instructions (Signed)
Follow up with your md if needed °

## 2022-12-03 NOTE — ED Notes (Signed)
800 mls of LR has infused at this time.

## 2022-12-03 NOTE — ED Notes (Signed)
Pt requesting to order lunch. Instructed that he will need to wait for MD to see him so that orders can be placed. Pt verbalized understanding.

## 2022-12-03 NOTE — ED Notes (Signed)
IVF continue to infuse by gravity. Patient given warm blanket for comfort will continue to monitor.

## 2022-12-03 NOTE — ED Provider Notes (Signed)
[facility] Provider Note  CSN: [id number] Arrival date & time: 12/03/22 [id number]  Chief Complaint(s) Fall  HPI [name] is a 37 y.o. male with PMH HIV, schizoaffective sorter, PTSD, bipolar disorder, HTN, adult failure to thrive, known chronic pressure ulcers who presents emergency department for evaluation of a fall.  Patient was recently seen in the emergency room and on 11/25/2022 and discharged home with home health but patient states home health is not seen him.  He states that he fell out of his chair this morning has been on the ground for approximately 1 hour.  He states that he has not eaten for over 12 hours and is requesting food.  Patient arrives mildly hypotensive, tachycardic.  Currently denies chest pain, shortness of breath, headache, fever or other systemic symptoms.     Past Medical History Past Medical History:  Diagnosis Date   Bipolar 1 disorder ([place])    Depression    Dizziness and giddiness 02/01/2016   Failure to thrive in adult    Herpes genitalia    HIV disease ([place])    Hypertension    Hyponatremia    Hypothermia 08/24/2021   Migraine headache 02/01/2016   Peripheral neuropathy 10/01/2019   PTSD (post-traumatic stress disorder)    Schizoaffective disorder ([place])    Seizures ([place])    Patient Active Problem List   Diagnosis Date Noted   Pressure injury of skin 08/28/2022   AMS (altered mental status) 08/26/2022   AKI (acute kidney injury) ([place]) 06/24/2022   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   Noncompliance with medications 02/20/2022   Hyperphosphatemia [id number]   Alcoholic cirrhosis of liver ([place]) 02/17/2022   Chronic alcohol use 02/17/2022   Bipolar disorder ([place]) 02/17/2022   Sepsis ([place]) 02/13/2022   Malnutrition of moderate degree 10/20/2021   Palliative care by specialist    DNR (do not resuscitate) 10/14/2021   Aspiration pneumonia ([place]) 10/14/2021   FTT (failure to thrive) in adult  10/14/2021   Dysphagia 10/14/2021   Coagulopathy ([place]) 10/14/2021   Protein-calorie malnutrition, severe ([place]) 10/09/2021    Class: Chronic   Avascular necrosis of femoral head, left ([place]) [id number]   Alcoholic cirrhosis of liver without ascites ([place]) 10/07/2021   Cellulitis of multiple sites of buttock 10/07/2021   Hypotension    Hypoxia 08/24/2021   Left hip pain    Transaminitis    Acute metabolic encephalopathy [id number]   Cellulitis 05/07/2021   Lymphadenopathy 09/29/2020   Anemia    Upper urinary tract infection    Sepsis secondary to UTI ([place]) 03/14/2020   Cellulitis of groin 03/14/2020   HIV disease ([place])    Macrocytic anemia    Thrombocytopenia (H[facility])    Prolonged QT interval    Acute respiratory failure due to COVID-19 ([place]) 10/27/2019   GERD (gastroesophageal reflux disease) 10/27/2019   Hypokalemia 10/27/2019   Peripheral neuropathy 10/01/2019   PTSD (post-traumatic stress disorder) 07/23/2018   Dizziness and giddiness 02/01/2016   Migraine headache 02/01/2016   Schizoaffective disorder, depressive type ([place]) 05/13/2015   Severe alcohol dependence ([place]) 05/13/2015   Suicidal ideation 01/12/2014   Home Medication(s) Prior to Admission medications   Medication Sig Start Date End Date Taking? Authorizing Provider  acetaminophen (TYLENOL) 500 MG tablet Take 1,000 mg by mouth every 6 (six) hours as needed for moderate pain or mild pain.    [provider]  albuterol (VENTOLIN HFA) 108 (90 Base) MCG/ACT inhaler Inhale 1 puff into the lungs every 6 (  six) hours as needed for wheezing or shortness of breath. 08/30/22 11/25/22  Amin, Jeanella Flattery, MD  ALPRAZolam Duanne Moron) 0.5 MG tablet Take 1 tablet (0.5 mg total) by mouth 3 (three) times daily as needed for anxiety. 08/30/22   Amin, Jeanella Flattery, MD  bictegravir-emtricitabine-tenofovir AF (BIKTARVY) 50-200-25 MG TABS tablet Take 1 tablet by mouth daily. Patient not taking: Reported on 11/25/2022 08/30/22   Damita Lack, MD  cyclobenzaprine (FLEXERIL) 10 MG tablet Take 10 mg by mouth 3 (three) times daily as needed. 11/18/22   [provider]  diphenoxylate-atropine (LOMOTIL) 2.5-0.025 MG tablet Take 1 tablet by mouth daily. 11/18/22   [provider]  escitalopram (LEXAPRO) 20 MG tablet Take 1 tablet (20 mg total) by mouth daily. 08/30/22   Amin, Jeanella Flattery, MD  famotidine (PEPCID) 40 MG tablet Take 40 mg by mouth every morning. 11/18/22   [provider]  feeding supplement (ENSURE ENLIVE / ENSURE PLUS) LIQD Take 237 mLs by mouth 2 (two) times daily between meals. 03/04/22   Florencia Reasons, MD  folic acid (FOLVITE) 1 MG tablet Take 1 tablet (1 mg total) by mouth daily. 08/30/22   Amin, Jeanella Flattery, MD  furosemide (LASIX) 40 MG tablet Take 40 mg by mouth daily as needed. 11/18/22   [provider]  haloperidol (HALDOL) 5 MG tablet Take 1 tablet (5 mg total) by mouth daily. 08/30/22   Amin, Jeanella Flattery, MD  HYDROcodone-acetaminophen (NORCO/VICODIN) 5-325 MG tablet Take 1 tablet by mouth 2 (two) times daily as needed. 11/18/22   [provider]  ibuprofen (ADVIL) 200 MG tablet Take 600 mg by mouth every 6 (six) hours as needed for mild pain or moderate pain.    [provider]  lip balm (CARMEX) ointment Apply topically as needed for lip care. 07/07/22   Allie Bossier, MD  Lurasidone HCl 120 MG TABS Take 1 tablet (120 mg total) by mouth daily. 08/30/22   Amin, Jeanella Flattery, MD  Multiple Vitamin (MULTIVITAMIN WITH MINERALS) TABS tablet Take 1 tablet by mouth daily. 09/01/21   Raiford Noble Latif, DO  nystatin (MYCOSTATIN/NYSTOP) powder Apply topically 2 (two) times daily. 07/07/22   Allie Bossier, MD  ondansetron (ZOFRAN) 8 MG tablet Take 8 mg by mouth every 8 (eight) hours as needed. 11/18/22   [provider]  pantoprazole (PROTONIX) 40 MG tablet Take 1 tablet (40 mg total) by mouth daily. 08/30/22   Amin, Jeanella Flattery, MD  Polyethyl Glycol-Propyl  Glycol (SYSTANE OP) Place 1-2 drops into both eyes daily as needed (Dry eyes).    [provider]  senna-docusate (SENOKOT-S) 8.6-50 MG tablet Take 1 tablet by mouth at bedtime as needed for moderate constipation. 08/30/22   Amin, Ankit Chirag, MD  temazepam (RESTORIL) 30 MG capsule Take 30 mg by mouth at bedtime. 10/11/22   [provider]  thiamine (VITAMIN B1) 100 MG tablet Take 1 tablet (100 mg total) by mouth daily. 08/30/22   Amin, Jeanella Flattery, MD  trazodone (DESYREL) 300 MG tablet Take 300 mg by mouth at bedtime. 11/18/22   [provider]  trolamine salicylate (ASPERCREME) 10 % cream Apply 1 Application topically as needed for muscle pain.    [provider]  valACYclovir (VALTREX) 1000 MG tablet Take 1 tablet (1,000 mg total) by mouth daily. 08/30/22   Damita Lack, MD  Past Surgical History Past Surgical History:  Procedure Laterality Date   BACK SURGERY     BIOPSY  02/26/2021   Procedure: BIOPSY;  Surgeon: Otis Brace, MD;  Location: [facility];  Service: Gastroenterology;;   COLONOSCOPY WITH PROPOFOL [name] 02/26/2021   Procedure: COLONOSCOPY WITH PROPOFOL;  Surgeon: Otis Brace, MD;  Location: [facility];  Service: Gastroenterology;  Laterality: [name];   ESOPHAGOGASTRODUODENOSCOPY (EGD) WITH PROPOFOL [name] 02/26/2021   Procedure: ESOPHAGOGASTRODUODENOSCOPY (EGD) WITH PROPOFOL;  Surgeon: Otis Brace, MD;  Location: [facility];  Service: Gastroenterology;  Laterality: [name];   ESOPHAGOGASTRODUODENOSCOPY (EGD) WITH PROPOFOL [name] 05/18/2021   Procedure: ESOPHAGOGASTRODUODENOSCOPY (EGD) WITH PROPOFOL;  Surgeon: Otis Brace, MD;  Location: [facility];  Service: Gastroenterology;  Laterality: [name];   HAND SURGERY     Family History Family History  Problem Relation Age of Onset   Alcohol abuse Mother     Schizophrenia Father    Depression Father    Alcohol abuse Father    Alcohol abuse Paternal Uncle    Alcohol abuse Paternal Uncle     Social History Social History   Tobacco Use   Smoking status: Every Day    Packs/day: 1.00    Types: Cigarettes   Smokeless tobacco: Never  Vaping Use   Vaping Use: Never used  Substance Use Topics   Alcohol use: Yes    Alcohol/week: 14.0 standard drinks of alcohol    Types: 14 Cans of beer per week    Comment: 5-6 40oz per day   Drug use: No   Allergies Aczone [dapsone], Pollen extract, and Primaquine phosphate  Review of Systems Review of Systems  Constitutional:  Positive for fatigue.  Skin:  Positive for wound.    Physical Exam Vital Signs  I have reviewed the triage vital signs BP 97/69 (BP Location: Left Arm)   Pulse (!) 118   Temp 97.9 F (36.6 C) (Oral)   Resp 18   Ht '6\' 3"'$  (1.905 m)   Wt 67 kg   SpO2 100%   BMI 18.46 kg/m   Physical Exam Constitutional:      General: He is not in acute distress.    Appearance: Normal appearance.  HENT:     Head: Normocephalic and atraumatic.     Nose: No congestion or rhinorrhea.  Eyes:     General:        Right eye: No discharge.        Left eye: No discharge.     Extraocular Movements: Extraocular movements intact.     Pupils: Pupils are equal, round, and reactive to light.  Cardiovascular:     Rate and Rhythm: Regular rhythm. Tachycardia present.     Heart sounds: No murmur heard. Pulmonary:     Effort: No respiratory distress.     Breath sounds: No wheezing or rales.  Abdominal:     General: There is no distension.     Tenderness: There is no abdominal tenderness.  Musculoskeletal:        General: Normal range of motion.     Cervical back: Normal range of motion.  Skin:    General: Skin is warm and dry.     Findings: Lesion present.  Neurological:     General: No focal deficit present.     Mental Status: He is alert.         ED Results and  Treatments Labs (all labs ordered are listed, but only abnormal results are displayed) Labs Reviewed  COMPREHENSIVE METABOLIC PANEL  CBC  WITH DIFFERENTIAL/PLATELET  CK                                                                                                                          Radiology No results found.  Pertinent labs & imaging results that were available during my care of the patient were reviewed by me and considered in my medical decision making (see MDM for details).  Medications Ordered in ED Medications  lactated ringers bolus 1,000 mL (has no administration in time range)                                                                                                                                     Procedures Procedures  (including critical care time)  Medical Decision Making / ED Course   This patient presents to the ED for concern of fall, wound, this involves an extensive number of treatment options, and is a complaint that carries with it a high risk of complications and morbidity.  The differential diagnosis includes chronic pressure ulcers, adult failure to thrive, deconditioning, infected pressure ulcers, osteomyelitis, electrolyte abnormality, dehydration  MDM: Patient seen emerged part for evaluation of multiple complaints described above.  Physical exam reveals a cachectic ill-appearing patient with chronic lower extremity wounds but no purulence or surrounding erythema.  Blood work ordered and fluid resuscitation begun.  At time of signout, patient pending laboratory evaluation.  Please see provider signout for continuation of workup.   Additional history obtained:  -External records from outside source obtained and reviewed including: Chart review including previous notes, labs, imaging, consultation notes   Lab Tests: -I ordered, reviewed, and interpreted labs.   The pertinent results include:   Labs Reviewed  COMPREHENSIVE METABOLIC PANEL   CBC WITH DIFFERENTIAL/PLATELET  CK      Medicines ordered and prescription drug management: Meds ordered this encounter  Medications   lactated ringers bolus 1,000 mL    -I have reviewed the patients home medicines and have made adjustments as needed  Critical interventions none    Cardiac Monitoring: The patient was maintained on a cardiac monitor.  I personally viewed and interpreted the cardiac monitored which showed an underlying rhythm of: sinus tachycardia  Social Determinants of Health:  Factors impacting patients care include: Lives at home, home health   Reevaluation: After the interventions noted above, I reevaluated the patient and found that they have :stayed  the same  Co morbidities that complicate the patient evaluation  Past Medical History:  Diagnosis Date   Bipolar 1 disorder ([place])    Depression    Dizziness and giddiness 02/01/2016   Failure to thrive in adult    Herpes genitalia    HIV disease ([place])    Hypertension    Hyponatremia    Hypothermia 08/24/2021   Migraine headache 02/01/2016   Peripheral neuropathy 10/01/2019   PTSD (post-traumatic stress disorder)    Schizoaffective disorder ([place])    Seizures ([place])       Dispostion: I considered admission for this patient, and disposition pending laboratory evaluation.  Please see provider signout for continuation of workup     Final Clinical Impression(s) / ED Diagnoses Final diagnoses:  None     '@PCDICTATION'$ @    [name], MD 12/03/22 [id number]

## 2022-12-03 NOTE — ED Triage Notes (Signed)
Pt brought in by [facility] with his mom reporting she sent him in because she wants him placed in a facility per EMS. Pt has a legal guardian but reports he doesn't want to go to a facility.

## 2022-12-03 NOTE — Discharge Instructions (Signed)
Home health nurse will come to your house on February 28 and continue additional home care until you get into a nursing home

## 2022-12-03 NOTE — ED Notes (Signed)
Pt's mother wants to be contacted by SW. Her name is West Carbo and she can be contacted at [phone].

## 2022-12-03 NOTE — ED Notes (Addendum)
Attempted to contact mother about patient's transport home.

## 2022-12-03 NOTE — ED Notes (Signed)
Transport contacted

## 2022-12-03 NOTE — ED Triage Notes (Signed)
Pt arrived via [facility] from home who report EMS was called due to Pt sliding out of his recliner and landing on the floor where he laid for apprx 1hr prior to EMS arrival. Pt presents with multiple pre-existing pressure injuries, some of which are saturated with dry blood. Pt is a poor historian, endorsing bilateral leg pain, buttocks and back pain.

## 2022-12-03 NOTE — ED Provider Notes (Signed)
[facility] EMERGENCY DEPARTMENT AT [facility] Provider Note   CSN: [id number] Arrival date & time: 12/03/22  1132     History  Chief Complaint  Patient presents with   Placement     Yale L Clinton is a 37 y.o. male.  Patient has a history of HIV, schizophrenia, failure to thrive and chronic and chronic pressure ulcers.  Patient's family has been attempting to get him to a nursing home and they have filled out all of appropriate paperwork.  His nurses aide decided to quit and they have no nurses aide now.  They stated they need some help taking care of him.  The history is provided by the patient and a relative. No language interpreter was used.  Weakness Severity:  Moderate Onset quality:  Gradual Timing:  Constant Progression:  Worsening Chronicity:  Recurrent Relieved by:  Nothing Worsened by:  Nothing Ineffective treatments:  None tried Associated symptoms: no abdominal pain, no chest pain, no cough, no diarrhea, no frequency, no headaches and no seizures        Home Medications Prior to Admission medications   Medication Sig Start Date End Date Taking? Authorizing Provider  acetaminophen (TYLENOL) 500 MG tablet Take 1,000 mg by mouth every 6 (six) hours as needed for moderate pain or mild pain.    [provider]  albuterol (VENTOLIN HFA) 108 (90 Base) MCG/ACT inhaler Inhale 1 puff into the lungs every 6 (six) hours as needed for wheezing or shortness of breath. 08/30/22 11/25/22  Amin, Loura Halt, MD  ALPRAZolam Prudy Feeler) 0.5 MG tablet Take 1 tablet (0.5 mg total) by mouth 3 (three) times daily as needed for anxiety. 08/30/22   Amin, Loura Halt, MD  bictegravir-emtricitabine-tenofovir AF (BIKTARVY) 50-200-25 MG TABS tablet Take 1 tablet by mouth daily. Patient not taking: Reported on 11/25/2022 08/30/22   Dimple Nanas, MD  cyclobenzaprine (FLEXERIL) 10 MG tablet Take 10 mg by mouth 3 (three) times daily as needed. 11/18/22   [provider]  diphenoxylate-atropine (LOMOTIL) 2.5-0.025 MG tablet Take 1 tablet by mouth daily. 11/18/22   [provider]  escitalopram (LEXAPRO) 20 MG tablet Take 1 tablet (20 mg total) by mouth daily. 08/30/22   Amin, Loura Halt, MD  famotidine (PEPCID) 40 MG tablet Take 40 mg by mouth every morning. 11/18/22   [provider]  feeding supplement (ENSURE ENLIVE / ENSURE PLUS) LIQD Take 237 mLs by mouth 2 (two) times daily between meals. 03/04/22   Albertine Grates, MD  folic acid (FOLVITE) 1 MG tablet Take 1 tablet (1 mg total) by mouth daily. 08/30/22   Amin, Loura Halt, MD  furosemide (LASIX) 40 MG tablet Take 40 mg by mouth daily as needed. 11/18/22   [provider]  haloperidol (HALDOL) 5 MG tablet Take 1 tablet (5 mg total) by mouth daily. 08/30/22   Amin, Loura Halt, MD  HYDROcodone-acetaminophen (NORCO/VICODIN) 5-325 MG tablet Take 1 tablet by mouth 2 (two) times daily as needed. 11/18/22   [provider]  ibuprofen (ADVIL) 200 MG tablet Take 600 mg by mouth every 6 (six) hours as needed for mild pain or moderate pain.    [provider]  lip balm (CARMEX) ointment Apply topically as needed for lip care. 07/07/22   Drema Dallas, MD  Lurasidone HCl 120 MG TABS Take 1 tablet (120 mg total) by mouth daily. 08/30/22   Amin, Loura Halt, MD  Multiple Vitamin (MULTIVITAMIN WITH MINERALS) TABS tablet Take 1 tablet by  mouth daily. 09/01/21   Marguerita Merles Latif, DO  nystatin (MYCOSTATIN/NYSTOP) powder Apply topically 2 (two) times daily. 07/07/22   Drema Dallas, MD  ondansetron (ZOFRAN) 8 MG tablet Take 8 mg by mouth every 8 (eight) hours as needed. 11/18/22   [provider]  pantoprazole (PROTONIX) 40 MG tablet Take 1 tablet (40 mg total) by mouth daily. 08/30/22   Amin, Loura Halt, MD  Polyethyl Glycol-Propyl Glycol (SYSTANE OP) Place 1-2 drops into both eyes daily as needed (Dry eyes).    [provider]  senna-docusate (SENOKOT-S)  8.6-50 MG tablet Take 1 tablet by mouth at bedtime as needed for moderate constipation. 08/30/22   Amin, Ankit Chirag, MD  temazepam (RESTORIL) 30 MG capsule Take 30 mg by mouth at bedtime. 10/11/22   [provider]  thiamine (VITAMIN B1) 100 MG tablet Take 1 tablet (100 mg total) by mouth daily. 08/30/22   Amin, Loura Halt, MD  trazodone (DESYREL) 300 MG tablet Take 300 mg by mouth at bedtime. 11/18/22   [provider]  trolamine salicylate (ASPERCREME) 10 % cream Apply 1 Application topically as needed for muscle pain.    [provider]  valACYclovir (VALTREX) 1000 MG tablet Take 1 tablet (1,000 mg total) by mouth daily. 08/30/22   Amin, Loura Halt, MD      Allergies    Aczone [dapsone], Pollen extract, and Primaquine phosphate    Review of Systems   Review of Systems  Constitutional:  Negative for appetite change and fatigue.  HENT:  Negative for congestion, ear discharge and sinus pressure.   Eyes:  Negative for discharge.  Respiratory:  Negative for cough.   Cardiovascular:  Negative for chest pain.  Gastrointestinal:  Negative for abdominal pain and diarrhea.  Genitourinary:  Negative for frequency and hematuria.  Musculoskeletal:  Negative for back pain.  Skin:  Negative for rash.  Neurological:  Positive for weakness. Negative for seizures and headaches.  Psychiatric/Behavioral:  Negative for hallucinations.     Physical Exam Updated Vital Signs BP 111/79 (BP Location: Left Arm)   Pulse (!) 115   Temp 98.3 F (36.8 C) (Oral)   Resp 16   Ht 6\' 3"  (1.905 m)   Wt 67 kg   SpO2 99%   BMI 18.46 kg/m  Physical Exam Vitals and nursing note reviewed.  Constitutional:      Appearance: He is well-developed.  HENT:     Head: Normocephalic.     Nose: Nose normal.  Eyes:     General: No scleral icterus.    Conjunctiva/sclera: Conjunctivae normal.  Neck:     Thyroid: No thyromegaly.  Cardiovascular:     Rate and Rhythm: Normal rate and regular  rhythm.     Heart sounds: No murmur heard.    No friction rub. No gallop.  Pulmonary:     Breath sounds: No stridor. No wheezing or rales.  Chest:     Chest wall: No tenderness.  Abdominal:     General: There is no distension.     Tenderness: There is no abdominal tenderness. There is no rebound.  Musculoskeletal:        General: Normal range of motion.     Cervical back: Neck supple.  Lymphadenopathy:     Cervical: No cervical adenopathy.  Skin:    Comments: Patient with pressure ulcers  Neurological:     Mental Status: He is alert and oriented to person, place, and time.     Motor: No abnormal  muscle tone.     Coordination: Coordination normal.  Psychiatric:        Behavior: Behavior normal.     ED Results / Procedures / Treatments   Labs (all labs ordered are listed, but only abnormal results are displayed) Labs Reviewed - No data to display  EKG None  Radiology No results found.  Procedures Procedures    Medications Ordered in ED Medications - No data to display  ED Course/ Medical Decision Making/ A&P                             Medical Decision Making Patient with HIV and failure to thrive and chronic pressor ulcers. He will be discharged home and a home health nurse will arrive in a couple days to help with care       Final Clinical Impression(s) / ED Diagnoses Final diagnoses:  None    Rx / DC Orders ED Discharge Orders     None         Bethann Berkshire, MD 12/06/22 1554

## 2022-12-06 ENCOUNTER — Other Ambulatory Visit: Payer: Self-pay

## 2022-12-06 ENCOUNTER — Inpatient Hospital Stay (HOSPITAL_COMMUNITY)
Admission: EM | Admit: 2022-12-06 | Discharge: 2022-12-15 | DRG: 689 | Disposition: A | Discharge status: Skilled Nursing Facility | Payer: Medicare HMO | Attending: Internal Medicine | Admitting: Internal Medicine

## 2022-12-06 ENCOUNTER — Emergency Department (HOSPITAL_COMMUNITY): Payer: Medicare HMO

## 2022-12-06 ENCOUNTER — Encounter (HOSPITAL_COMMUNITY): Payer: Self-pay | Admitting: Radiology

## 2022-12-06 DIAGNOSIS — E43 Unspecified severe protein-calorie malnutrition: Secondary | ICD-10-CM | POA: Diagnosis present

## 2022-12-06 DIAGNOSIS — M87852 Other osteonecrosis, left femur: Secondary | ICD-10-CM | POA: Diagnosis present

## 2022-12-06 DIAGNOSIS — R627 Adult failure to thrive: Secondary | ICD-10-CM | POA: Diagnosis present

## 2022-12-06 DIAGNOSIS — K6289 Other specified diseases of anus and rectum: Secondary | ICD-10-CM | POA: Diagnosis present

## 2022-12-06 DIAGNOSIS — I10 Essential (primary) hypertension: Secondary | ICD-10-CM | POA: Diagnosis present

## 2022-12-06 DIAGNOSIS — G909 Disorder of the autonomic nervous system, unspecified: Secondary | ICD-10-CM | POA: Diagnosis present

## 2022-12-06 DIAGNOSIS — E785 Hyperlipidemia, unspecified: Secondary | ICD-10-CM | POA: Diagnosis present

## 2022-12-06 DIAGNOSIS — Z888 Allergy status to other drugs, medicaments and biological substances status: Secondary | ICD-10-CM

## 2022-12-06 DIAGNOSIS — F259 Schizoaffective disorder, unspecified: Secondary | ICD-10-CM | POA: Diagnosis present

## 2022-12-06 DIAGNOSIS — Z1152 Encounter for screening for COVID-19: Secondary | ICD-10-CM | POA: Diagnosis not present

## 2022-12-06 DIAGNOSIS — D649 Anemia, unspecified: Secondary | ICD-10-CM | POA: Diagnosis present

## 2022-12-06 DIAGNOSIS — F319 Bipolar disorder, unspecified: Secondary | ICD-10-CM | POA: Diagnosis present

## 2022-12-06 DIAGNOSIS — N390 Urinary tract infection, site not specified: Principal | ICD-10-CM | POA: Diagnosis present

## 2022-12-06 DIAGNOSIS — Z681 Body mass index (BMI) 19 or less, adult: Secondary | ICD-10-CM

## 2022-12-06 DIAGNOSIS — R64 Cachexia: Secondary | ICD-10-CM | POA: Diagnosis present

## 2022-12-06 DIAGNOSIS — K219 Gastro-esophageal reflux disease without esophagitis: Secondary | ICD-10-CM | POA: Diagnosis present

## 2022-12-06 DIAGNOSIS — Z9109 Other allergy status, other than to drugs and biological substances: Secondary | ICD-10-CM

## 2022-12-06 DIAGNOSIS — D638 Anemia in other chronic diseases classified elsewhere: Secondary | ICD-10-CM | POA: Diagnosis present

## 2022-12-06 DIAGNOSIS — L89893 Pressure ulcer of other site, stage 3: Secondary | ICD-10-CM | POA: Diagnosis present

## 2022-12-06 DIAGNOSIS — A419 Sepsis, unspecified organism: Secondary | ICD-10-CM

## 2022-12-06 DIAGNOSIS — L89313 Pressure ulcer of right buttock, stage 3: Secondary | ICD-10-CM | POA: Diagnosis present

## 2022-12-06 DIAGNOSIS — M87052 Idiopathic aseptic necrosis of left femur: Secondary | ICD-10-CM | POA: Diagnosis present

## 2022-12-06 DIAGNOSIS — W07XXXA Fall from chair, initial encounter: Secondary | ICD-10-CM | POA: Diagnosis present

## 2022-12-06 DIAGNOSIS — L89153 Pressure ulcer of sacral region, stage 3: Secondary | ICD-10-CM | POA: Diagnosis present

## 2022-12-06 DIAGNOSIS — Z79899 Other long term (current) drug therapy: Secondary | ICD-10-CM

## 2022-12-06 DIAGNOSIS — Z818 Family history of other mental and behavioral disorders: Secondary | ICD-10-CM

## 2022-12-06 DIAGNOSIS — F17210 Nicotine dependence, cigarettes, uncomplicated: Secondary | ICD-10-CM | POA: Diagnosis present

## 2022-12-06 DIAGNOSIS — K7030 Alcoholic cirrhosis of liver without ascites: Secondary | ICD-10-CM | POA: Diagnosis present

## 2022-12-06 DIAGNOSIS — Z5941 Food insecurity: Secondary | ICD-10-CM

## 2022-12-06 DIAGNOSIS — B20 Human immunodeficiency virus [HIV] disease: Secondary | ICD-10-CM | POA: Diagnosis present

## 2022-12-06 DIAGNOSIS — E860 Dehydration: Secondary | ICD-10-CM | POA: Diagnosis present

## 2022-12-06 DIAGNOSIS — Z7401 Bed confinement status: Secondary | ICD-10-CM

## 2022-12-06 DIAGNOSIS — R54 Age-related physical debility: Secondary | ICD-10-CM | POA: Diagnosis present

## 2022-12-06 DIAGNOSIS — S31809D Unspecified open wound of unspecified buttock, subsequent encounter: Secondary | ICD-10-CM

## 2022-12-06 DIAGNOSIS — R008 Other abnormalities of heart beat: Secondary | ICD-10-CM | POA: Diagnosis not present

## 2022-12-06 DIAGNOSIS — I951 Orthostatic hypotension: Secondary | ICD-10-CM | POA: Diagnosis present

## 2022-12-06 DIAGNOSIS — F4310 Post-traumatic stress disorder, unspecified: Secondary | ICD-10-CM | POA: Diagnosis present

## 2022-12-06 DIAGNOSIS — L732 Hidradenitis suppurativa: Secondary | ICD-10-CM | POA: Diagnosis present

## 2022-12-06 DIAGNOSIS — Z811 Family history of alcohol abuse and dependence: Secondary | ICD-10-CM

## 2022-12-06 DIAGNOSIS — Z91148 Patient's other noncompliance with medication regimen for other reason: Secondary | ICD-10-CM

## 2022-12-06 DIAGNOSIS — G9341 Metabolic encephalopathy: Secondary | ICD-10-CM | POA: Diagnosis present

## 2022-12-06 LAB — COMPREHENSIVE METABOLIC PANEL
ALT: 8 U/L (ref 0–44)
AST: 22 U/L (ref 15–41)
Albumin: 2.7 g/dL — ABNORMAL LOW (ref 3.5–5.0)
Alkaline Phosphatase: 90 U/L (ref 38–126)
Anion gap: 16 — ABNORMAL HIGH (ref 5–15)
BUN: 14 mg/dL (ref 6–20)
CO2: 20 mmol/L — ABNORMAL LOW (ref 22–32)
Calcium: 9.5 mg/dL (ref 8.9–10.3)
Chloride: 100 mmol/L (ref 98–111)
Creatinine, Ser: 1.16 mg/dL (ref 0.61–1.24)
GFR, Estimated: >60 mL/min (ref >60)
Glucose, Bld: 94 mg/dL (ref 70–99)
Potassium: 4.1 mmol/L (ref 3.5–5.1)
Sodium: 136 mmol/L (ref 135–145)
Total Bilirubin: 1.4 mg/dL — ABNORMAL HIGH (ref 0.3–1.2)
Total Protein: 9.6 g/dL — ABNORMAL HIGH (ref 6.5–8.1)

## 2022-12-06 LAB — URINALYSIS, W/ REFLEX TO CULTURE (INFECTION SUSPECTED)
Bacteria, UA: NONE SEEN
Glucose, UA: NEGATIVE mg/dL
Hgb urine dipstick: NEGATIVE
Ketones, ur: 80 mg/dL — AB
Nitrite: NEGATIVE
Protein, ur: 100 mg/dL — AB
Specific Gravity, Urine: 1.026 (ref 1.005–1.030)
pH: 5 (ref 5.0–8.0)

## 2022-12-06 LAB — LACTIC ACID, PLASMA
Lactic Acid, Venous: 2.5 mmol/L (ref 0.5–1.9)
Lactic Acid, Venous: 1.8 mmol/L (ref 0.5–1.9)

## 2022-12-06 LAB — CBC WITH DIFFERENTIAL/PLATELET
Abs Immature Granulocytes: 0.08 10*3/uL — ABNORMAL HIGH (ref 0.00–0.07)
Basophils Absolute: 0.1 10*3/uL (ref 0.0–0.1)
Basophils Relative: 1 %
Eosinophils Absolute: 0.1 10*3/uL (ref 0.0–0.5)
Eosinophils Relative: 1 %
HCT: 27.8 % — ABNORMAL LOW (ref 39.0–52.0)
Hemoglobin: 8.2 g/dL — ABNORMAL LOW (ref 13.0–17.0)
Immature Granulocytes: 1 %
Lymphocytes Relative: 19 %
Lymphs Abs: 2.8 10*3/uL (ref 0.7–4.0)
MCH: 27.8 pg (ref 26.0–34.0)
MCHC: 29.5 g/dL — ABNORMAL LOW (ref 30.0–36.0)
MCV: 94.2 fL (ref 80.0–100.0)
Monocytes Absolute: 0.6 10*3/uL (ref 0.1–1.0)
Monocytes Relative: 4 %
Neutro Abs: 10.7 10*3/uL — ABNORMAL HIGH (ref 1.7–7.7)
Neutrophils Relative %: 74 %
Platelets: 627 10*3/uL — ABNORMAL HIGH (ref 150–400)
RBC: 2.95 MIL/uL — ABNORMAL LOW (ref 4.22–5.81)
RDW: 14.6 % (ref 11.5–15.5)
WBC: 14.4 10*3/uL — ABNORMAL HIGH (ref 4.0–10.5)
nRBC: 0 % (ref 0.0–0.2)

## 2022-12-06 LAB — PROTIME-INR
INR: 1.2 (ref 0.8–1.2)
Prothrombin Time: 15.4 seconds — ABNORMAL HIGH (ref 11.4–15.2)

## 2022-12-06 LAB — RESP PANEL BY RT-PCR (RSV, FLU A&B, COVID)  RVPGX2
Influenza A by PCR: NEGATIVE
Influenza B by PCR: NEGATIVE
Resp Syncytial Virus by PCR: NEGATIVE
SARS Coronavirus 2 by RT PCR: NEGATIVE

## 2022-12-06 LAB — MAGNESIUM: Magnesium: 2 mg/dL (ref 1.7–2.4)

## 2022-12-06 MED ORDER — THIAMINE MONONITRATE 100 MG PO TABS
100.0000 mg | ORAL_TABLET | Freq: Every day | ORAL | Status: DC
  Administered 2022-12-06 – 2022-12-15 (×10): 100 mg via ORAL
  Filled 2022-12-06 (×10): qty 1

## 2022-12-06 MED ORDER — LACTATED RINGERS IV SOLN: INTRAVENOUS | Status: AC

## 2022-12-06 MED ORDER — SODIUM CHLORIDE 0.9 % IV SOLN: INTRAVENOUS | Status: DC | PRN

## 2022-12-06 MED ORDER — HEPARIN SODIUM (PORCINE) 5000 UNIT/ML IJ SOLN
5000.0000 [IU] | Freq: Three times a day (TID) | INTRAMUSCULAR | Status: DC
  Administered 2022-12-06 – 2022-12-08 (×5): 5000 [IU] via SUBCUTANEOUS
  Filled 2022-12-06 (×5): qty 1

## 2022-12-06 MED ORDER — LURASIDONE HCL 40 MG PO TABS
120.0000 mg | ORAL_TABLET | Freq: Every day | ORAL | Status: DC
  Administered 2022-12-07 – 2022-12-15 (×9): 120 mg via ORAL
  Filled 2022-12-06 (×9): qty 3

## 2022-12-06 MED ORDER — DIPHENOXYLATE-ATROPINE 2.5-0.025 MG PO TABS: 1.0000 | ORAL_TABLET | Freq: Two times a day (BID) | ORAL | Status: DC | PRN

## 2022-12-06 MED ORDER — TEMAZEPAM 15 MG PO CAPS
30.0000 mg | ORAL_CAPSULE | Freq: Every day | ORAL | Status: DC
  Administered 2022-12-06 – 2022-12-14 (×9): 30 mg via ORAL
  Filled 2022-12-06 (×9): qty 2

## 2022-12-06 MED ORDER — VANCOMYCIN HCL 1500 MG/300ML IV SOLN
1500.0000 mg | Freq: Once | INTRAVENOUS | Status: AC
  Administered 2022-12-06: 1500 mg via INTRAVENOUS
  Filled 2022-12-06: qty 300

## 2022-12-06 MED ORDER — BISACODYL 10 MG RE SUPP: 10.0000 mg | Freq: Every day | RECTAL | Status: DC | PRN

## 2022-12-06 MED ORDER — METRONIDAZOLE 500 MG/100ML IV SOLN
500.0000 mg | Freq: Once | INTRAVENOUS | Status: AC
  Administered 2022-12-06: 500 mg via INTRAVENOUS
  Filled 2022-12-06: qty 100

## 2022-12-06 MED ORDER — MIDODRINE HCL 5 MG PO TABS
10.0000 mg | ORAL_TABLET | Freq: Three times a day (TID) | ORAL | Status: DC
  Administered 2022-12-07 – 2022-12-13 (×19): 10 mg via ORAL
  Filled 2022-12-06 (×17): qty 2

## 2022-12-06 MED ORDER — FAMOTIDINE 20 MG PO TABS
40.0000 mg | ORAL_TABLET | Freq: Every morning | ORAL | Status: DC
  Administered 2022-12-07 – 2022-12-08 (×2): 40 mg via ORAL
  Filled 2022-12-06 (×2): qty 2

## 2022-12-06 MED ORDER — ESCITALOPRAM OXALATE 10 MG PO TABS
20.0000 mg | ORAL_TABLET | Freq: Every day | ORAL | Status: DC
  Administered 2022-12-07 – 2022-12-15 (×9): 20 mg via ORAL
  Filled 2022-12-06 (×9): qty 2

## 2022-12-06 MED ORDER — VALACYCLOVIR HCL 500 MG PO TABS
1000.0000 mg | ORAL_TABLET | Freq: Every day | ORAL | Status: DC
  Administered 2022-12-07 – 2022-12-15 (×9): 1000 mg via ORAL
  Filled 2022-12-06 (×9): qty 2

## 2022-12-06 MED ORDER — SODIUM CHLORIDE 0.9 % IV SOLN
2.0000 g | Freq: Once | INTRAVENOUS | Status: AC
  Administered 2022-12-06: 2 g via INTRAVENOUS
  Filled 2022-12-06: qty 12.5

## 2022-12-06 MED ORDER — ENSURE ENLIVE PO LIQD
237.0000 mL | Freq: Two times a day (BID) | ORAL | Status: DC
  Administered 2022-12-07 – 2022-12-15 (×13): 237 mL via ORAL

## 2022-12-06 MED ORDER — BICTEGRAVIR-EMTRICITAB-TENOFOV 50-200-25 MG PO TABS
1.0000 | ORAL_TABLET | Freq: Every day | ORAL | Status: DC
  Administered 2022-12-07 – 2022-12-15 (×9): 1 via ORAL
  Filled 2022-12-06 (×11): qty 1

## 2022-12-06 MED ORDER — ALPRAZOLAM 0.5 MG PO TABS
0.5000 mg | ORAL_TABLET | Freq: Three times a day (TID) | ORAL | Status: DC | PRN
  Administered 2022-12-06 – 2022-12-10 (×6): 0.5 mg via ORAL
  Filled 2022-12-06 (×6): qty 1

## 2022-12-06 MED ORDER — LACTATED RINGERS IV BOLUS (SEPSIS)
1000.0000 mL | Freq: Once | INTRAVENOUS | Status: AC
  Administered 2022-12-06: 1000 mL via INTRAVENOUS

## 2022-12-06 MED ORDER — VANCOMYCIN HCL 750 MG/150ML IV SOLN
750.0000 mg | Freq: Two times a day (BID) | INTRAVENOUS | Status: AC
  Administered 2022-12-07: 750 mg via INTRAVENOUS
  Filled 2022-12-06 (×3): qty 150

## 2022-12-06 MED ORDER — SENNOSIDES-DOCUSATE SODIUM 8.6-50 MG PO TABS
2.0000 | ORAL_TABLET | Freq: Every day | ORAL | Status: DC
  Administered 2022-12-06 – 2022-12-14 (×9): 2 via ORAL
  Filled 2022-12-06 (×9): qty 2

## 2022-12-06 MED ORDER — ADULT MULTIVITAMIN W/MINERALS CH
1.0000 | ORAL_TABLET | Freq: Every day | ORAL | Status: DC
  Administered 2022-12-06 – 2022-12-14 (×10): 1 via ORAL
  Filled 2022-12-06 (×10): qty 1

## 2022-12-06 MED ORDER — SODIUM CHLORIDE 0.9% FLUSH
3.0000 mL | Freq: Two times a day (BID) | INTRAVENOUS | Status: DC
  Administered 2022-12-06 – 2022-12-15 (×18): 3 mL via INTRAVENOUS

## 2022-12-06 MED ORDER — IOHEXOL 300 MG/ML  SOLN
100.0000 mL | Freq: Once | INTRAMUSCULAR | Status: AC | PRN
  Administered 2022-12-06: 100 mL via INTRAVENOUS

## 2022-12-06 MED ORDER — TRAZODONE HCL 50 MG PO TABS
300.0000 mg | ORAL_TABLET | Freq: Every day | ORAL | Status: DC
  Administered 2022-12-06 – 2022-12-07 (×2): 300 mg via ORAL
  Filled 2022-12-06 (×2): qty 6

## 2022-12-06 MED ORDER — PANTOPRAZOLE SODIUM 40 MG PO TBEC: 40.0000 mg | DELAYED_RELEASE_TABLET | Freq: Every day | ORAL | Status: DC

## 2022-12-06 MED ORDER — POLYETHYLENE GLYCOL 3350 17 G PO PACK: 17.0000 g | PACK | Freq: Every day | ORAL | Status: DC | PRN

## 2022-12-06 MED ORDER — VANCOMYCIN HCL IN DEXTROSE 1-5 GM/200ML-% IV SOLN: 1000.0000 mg | Freq: Once | INTRAVENOUS | Status: DC

## 2022-12-06 MED ORDER — SODIUM CHLORIDE 0.9 % IV SOLN: 2.0000 g | Freq: Three times a day (TID) | INTRAVENOUS | Status: DC

## 2022-12-06 MED ORDER — SODIUM CHLORIDE 0.9% FLUSH: 3.0000 mL | INTRAVENOUS | Status: DC | PRN

## 2022-12-06 MED ORDER — ACETAMINOPHEN 650 MG RE SUPP: 650.0000 mg | Freq: Four times a day (QID) | RECTAL | Status: DC | PRN

## 2022-12-06 MED ORDER — ACETAMINOPHEN 325 MG PO TABS
650.0000 mg | ORAL_TABLET | Freq: Four times a day (QID) | ORAL | Status: DC | PRN
  Administered 2022-12-06 – 2022-12-08 (×3): 650 mg via ORAL
  Filled 2022-12-06 (×3): qty 2

## 2022-12-06 MED ORDER — SODIUM CHLORIDE 0.9% FLUSH
3.0000 mL | Freq: Two times a day (BID) | INTRAVENOUS | Status: DC
  Administered 2022-12-06 – 2022-12-08 (×4): 3 mL via INTRAVENOUS

## 2022-12-06 MED ORDER — SODIUM CHLORIDE 0.9 % IV SOLN
1.0000 g | INTRAVENOUS | Status: DC
  Administered 2022-12-06 – 2022-12-11 (×6): 1 g via INTRAVENOUS
  Filled 2022-12-06 (×6): qty 10

## 2022-12-06 MED ORDER — METRONIDAZOLE 500 MG/100ML IV SOLN
500.0000 mg | Freq: Two times a day (BID) | INTRAVENOUS | Status: DC
  Administered 2022-12-06 – 2022-12-08 (×4): 500 mg via INTRAVENOUS
  Filled 2022-12-06 (×4): qty 100

## 2022-12-06 MED ORDER — CYCLOBENZAPRINE HCL 10 MG PO TABS
10.0000 mg | ORAL_TABLET | Freq: Three times a day (TID) | ORAL | Status: DC | PRN
  Administered 2022-12-08: 10 mg via ORAL
  Filled 2022-12-06: qty 1

## 2022-12-06 MED ORDER — LACTATED RINGERS IV SOLN: INTRAVENOUS | Status: DC

## 2022-12-06 NOTE — ED Triage Notes (Signed)
Pt has not eaten or been changed x 2 days. Mother called [facility] for AMS and mother told them she could no longer take care of him and don't bring him back. Pt is covered in feces and alert. 18 G In LAC and 300 NS bolus.

## 2022-12-06 NOTE — Progress Notes (Signed)
   12/06/22 2033  Assess: MEWS Score  Temp 99.1 F (37.3 C)  BP 111/74  MAP (mmHg) 85  Pulse Rate (!) 111  Resp (!) 22  SpO2 100 %  O2 Device Room Air  Assess: MEWS Score  MEWS Temp 0  MEWS Systolic 0  MEWS Pulse 2  MEWS RR 1  MEWS LOC 0  MEWS Score 3  MEWS Score Color Yellow  Assess: if the MEWS score is Yellow or Red  Were vital signs taken at a resting state? Yes  Focused Assessment No change from prior assessment  Does the patient meet 2 or more of the SIRS criteria? No  MEWS guidelines implemented  No, previously yellow, continue vital signs every 4 hours  Notify: Charge Nurse/RN  Name of Charge Nurse/RN Notified Tina, RN  Assess: SIRS CRITERIA  SIRS Temperature  0  SIRS Pulse 1  SIRS Respirations  1  SIRS WBC 0  SIRS Score Sum  2

## 2022-12-06 NOTE — ED Notes (Signed)
Attempted to call report

## 2022-12-06 NOTE — ED Notes (Signed)
EDP assessed pt during triage.

## 2022-12-06 NOTE — Progress Notes (Signed)
[facility] will follow per sepsis protocol

## 2022-12-06 NOTE — ED Notes (Signed)
Pt does not have a legal guardian per mother.

## 2022-12-06 NOTE — ED Provider Notes (Signed)
[facility] Provider Note   CSN: [id number] Arrival date & time: 12/06/22  [id number]     History  Chief Complaint  Patient presents with   Failure To [place] is a 37 y.o. male.  HPI Patient presents for failure to thrive.  Medical history includes bipolar disorder, HIV, HTN, seizures, neuropathy, alcohol dependence, GERD, cirrhosis.  Patient is bedbound and does not have use of his legs.  He does not know why.  Typically, he has a caregiver at home.  EMS reports that caregiver quit 2 days ago.  Since that time, he has been laying in his own urine and feces and has not eaten or drank anything.  EMS reports that there is a mother at home who is physically unable to care for him.  EMS noted hypotension.  Patient currently denies any new areas of pain.  He denies any shortness of breath.    Home Medications Prior to Admission medications   Medication Sig Start Date End Date Taking? Authorizing Provider  acetaminophen (TYLENOL) 500 MG tablet Take 1,000 mg by mouth every 6 (six) hours as needed for moderate pain or mild pain.   Yes [provider]  ALPRAZolam (XANAX) 0.5 MG tablet Take 1 tablet (0.5 mg total) by mouth 3 (three) times daily as needed for anxiety. 08/30/22  Yes Amin, Ankit Chirag, MD  bictegravir-emtricitabine-tenofovir AF (BIKTARVY) 50-200-25 MG TABS tablet Take 1 tablet by mouth daily. 08/30/22  Yes Amin, Jeanella Flattery, MD  cyclobenzaprine (FLEXERIL) 10 MG tablet Take 10 mg by mouth 3 (three) times daily as needed. 11/18/22  Yes [provider]  diphenoxylate-atropine (LOMOTIL) 2.5-0.025 MG tablet Take 1 tablet by mouth daily. 11/18/22  Yes [provider]  escitalopram (LEXAPRO) 20 MG tablet Take 1 tablet (20 mg total) by mouth daily. 08/30/22  Yes Amin, Jeanella Flattery, MD  famotidine (PEPCID) 40 MG tablet Take 40 mg by mouth every morning. 11/18/22  Yes [provider]  furosemide (LASIX)  40 MG tablet Take 40 mg by mouth daily as needed. 11/18/22  Yes [provider]  ibuprofen (ADVIL) 200 MG tablet Take 600 mg by mouth every 6 (six) hours as needed for mild pain or moderate pain.   Yes [provider]  lip balm (CARMEX) ointment Apply topically as needed for lip care. 07/07/22  Yes Allie Bossier, MD  Lurasidone HCl 120 MG TABS Take 1 tablet (120 mg total) by mouth daily. 08/30/22  Yes Amin, Jeanella Flattery, MD  Multiple Vitamin (MULTIVITAMIN WITH MINERALS) TABS tablet Take 1 tablet by mouth daily. 09/01/21  Yes Sheikh, Omair Latif, DO  ondansetron (ZOFRAN) 8 MG tablet Take 8 mg by mouth every 8 (eight) hours as needed. 11/18/22  Yes [provider]  senna-docusate (SENOKOT-S) 8.6-50 MG tablet Take 1 tablet by mouth at bedtime as needed for moderate constipation. 08/30/22  Yes Amin, Ankit Chirag, MD  temazepam (RESTORIL) 30 MG capsule Take 30 mg by mouth at bedtime. 10/11/22  Yes [provider]  thiamine (VITAMIN B1) 100 MG tablet Take 1 tablet (100 mg total) by mouth daily. 08/30/22  Yes Amin, Jeanella Flattery, MD  trazodone (DESYREL) 300 MG tablet Take 300 mg by mouth at bedtime. 11/18/22  Yes [provider]  trolamine salicylate (ASPERCREME) 10 % cream Apply 1 Application topically as needed for muscle pain.   Yes [provider]  valACYclovir (VALTREX) 1000 MG tablet Take 1 tablet (1,000 mg  total) by mouth daily. 08/30/22  Yes Amin, Ankit Chirag, MD  albuterol (VENTOLIN HFA) 108 (90 Base) MCG/ACT inhaler Inhale 1 puff into the lungs every 6 (six) hours as needed for wheezing or shortness of breath. 08/30/22 11/25/22  Amin, Jeanella Flattery, MD  feeding supplement (ENSURE ENLIVE / ENSURE PLUS) LIQD Take 237 mLs by mouth 2 (two) times daily between meals. 03/04/22   Florencia Reasons, MD  folic acid (FOLVITE) 1 MG tablet Take 1 tablet (1 mg total) by mouth daily. Patient not taking: Reported on 12/06/2022 08/30/22   Damita Lack, MD  haloperidol  (HALDOL) 5 MG tablet Take 1 tablet (5 mg total) by mouth daily. Patient not taking: Reported on 12/06/2022 08/30/22   Damita Lack, MD  HYDROcodone-acetaminophen (NORCO/VICODIN) 5-325 MG tablet Take 1 tablet by mouth 2 (two) times daily as needed. Patient not taking: Reported on 12/06/2022 11/18/22   [provider]  nystatin (MYCOSTATIN/NYSTOP) powder Apply topically 2 (two) times daily. Patient not taking: Reported on 12/06/2022 07/07/22   Allie Bossier, MD  pantoprazole (PROTONIX) 40 MG tablet Take 1 tablet (40 mg total) by mouth daily. Patient not taking: Reported on 12/06/2022 08/30/22   Damita Lack, MD  Polyethyl Glycol-Propyl Glycol (SYSTANE OP) Place 1-2 drops into both eyes daily as needed (Dry eyes). Patient not taking: Reported on 12/06/2022    [provider]      Allergies    Aczone [dapsone], Pollen extract, and Primaquine phosphate    Review of Systems   Review of Systems  Constitutional:  Negative for chills and fever.  Respiratory:  Negative for shortness of breath.   Gastrointestinal:  Negative for abdominal pain and nausea.  Skin:  Positive for wound (Chronic pressure wounds).  All other systems reviewed and are negative.   Physical Exam Updated Vital Signs BP 115/88   Pulse 86   Temp 98.2 F (36.8 C) (Oral)   Resp 18   Ht '6\' 3"'$  (1.905 m)   Wt 67 kg   SpO2 97%   BMI 18.46 kg/m  Physical Exam Vitals and nursing note reviewed. Exam conducted with a chaperone present.  Constitutional:      General: He is not in acute distress.    Appearance: He is well-developed. He is ill-appearing (Chronically). He is not toxic-appearing or diaphoretic.  HENT:     Head: Normocephalic and atraumatic.     Right Ear: External ear normal.     Left Ear: External ear normal.     Nose: Nose normal.     Mouth/Throat:     Mouth: Mucous membranes are moist.  Eyes:     General: No scleral icterus.    Extraocular Movements: Extraocular movements intact.      Conjunctiva/sclera: Conjunctivae normal.  Cardiovascular:     Rate and Rhythm: Normal rate and regular rhythm.     Heart sounds: No murmur heard. Pulmonary:     Effort: Pulmonary effort is normal. No respiratory distress.     Breath sounds: Normal breath sounds. No wheezing or rales.  Abdominal:     General: There is no distension.     Palpations: Abdomen is soft.     Tenderness: There is no abdominal tenderness.  Genitourinary:    Comments: Extensive wounds in gluteal area.  Areas of excoriations. Musculoskeletal:     Cervical back: Normal range of motion and neck supple.  Skin:    General: Skin is warm and dry.     Coloration: Skin is not  jaundiced.  Neurological:     Mental Status: He is alert and oriented to person, place, and time.     Cranial Nerves: No cranial nerve deficit.     Comments: Chronic weakness in BLE  Psychiatric:        Mood and Affect: Mood normal.        Behavior: Behavior normal.     ED Results / Procedures / Treatments   Labs (all labs ordered are listed, but only abnormal results are displayed) Labs Reviewed  LACTIC ACID, PLASMA - Abnormal; Notable for the following components:      Result Value   Lactic Acid, Venous 2.5 (*)    All other components within normal limits  COMPREHENSIVE METABOLIC PANEL - Abnormal; Notable for the following components:   CO2 20 (*)    Total Protein 9.6 (*)    Albumin 2.7 (*)    Total Bilirubin 1.4 (*)    Anion gap 16 (*)    All other components within normal limits  CBC WITH DIFFERENTIAL/PLATELET - Abnormal; Notable for the following components:   WBC 14.4 (*)    RBC 2.95 (*)    Hemoglobin 8.2 (*)    HCT 27.8 (*)    MCHC 29.5 (*)    Platelets 627 (*)    Neutro Abs 10.7 (*)    Abs Immature Granulocytes 0.08 (*)    All other components within normal limits  PROTIME-INR - Abnormal; Notable for the following components:   Prothrombin Time 15.4 (*)    All other components within normal limits  URINALYSIS,  W/ REFLEX TO CULTURE (INFECTION SUSPECTED) - Abnormal; Notable for the following components:   Color, Urine AMBER (*)    APPearance HAZY (*)    Bilirubin Urine SMALL (*)    Ketones, ur 80 (*)    Protein, ur 100 (*)    Leukocytes,Ua TRACE (*)    All other components within normal limits  RESP PANEL BY RT-PCR (RSV, FLU A&B, COVID)  [id number]  CULTURE, BLOOD (ROUTINE X 2)  CULTURE, BLOOD (ROUTINE X 2)  URINE CULTURE  LACTIC ACID, PLASMA  MAGNESIUM    EKG EKG Interpretation  Date/Time:  Tuesday December 06 2022 10:40:04 EST Ventricular Rate:  117 PR Interval:  131 QRS Duration: 82 QT Interval:  333 QTC Calculation: 465 R Axis:   48 Text Interpretation: Sinus tachycardia Abnormal R-wave progression, early transition Confirmed by Godfrey Pick ([id number]) on 12/06/2022 11:52:37 AM  Radiology CT ABDOMEN PELVIS W CONTRAST  Result Date: 12/06/2022 CLINICAL DATA:  Sepsis, not eating for 2 days; history of PTSD, substance abuse, psychiatric disease, EXAM: CT ABDOMEN AND PELVIS WITH CONTRAST TECHNIQUE: Multidetector CT imaging of the abdomen and pelvis was performed using the standard protocol following bolus administration of intravenous contrast. RADIATION DOSE REDUCTION: This exam was performed according to the departmental dose-optimization program which includes automated exposure control, adjustment of the mA and/or kV according to patient size and/or use of iterative reconstruction technique. CONTRAST:  167m OMNIPAQUE IOHEXOL 300 MG/ML SOLN IV. No oral contrast. COMPARISON:  06/27/2022 FINDINGS: Lower chest: Lung bases clear Hepatobiliary: Gallbladder and liver normal appearance Pancreas: Normal appearance Spleen: Normal appearance Adrenals/Urinary Tract: Bladder decompressed with minimal dependent density which could represent tiny calculi or mural calcification. Adrenal glands, kidneys, and ureters unremarkable. No renal mass, hydronephrosis or urinary tract calcification. Stomach/Bowel: Mild rectal  wall thickening. Normal appendix. Stomach and remaining bowel loops unremarkable. Vascular/Lymphatic: Premature atherosclerotic calcifications aorta and iliac arteries without aneurysm. External iliac and inguinal  adenopathy bilaterally increased from previous exam. LEFT external iliac node 17 mm image 81. RIGHT external iliac node 13 mm image 80. Scattered normal sized retroperitoneal nodes. Reproductive: Unremarkable prostate gland and seminal vesicles Other: No free air or free fluid.  No hernia. Musculoskeletal: Advanced degenerative changes of the LEFT hip joint secondary to avascular necrosis with bone destruction at femoral head, joint space narrowing, articular surface irregularity, and associated joint effusion/debris. IMPRESSION: Mild rectal wall thickening question proctitis. Increased external iliac and inguinal adenopathy; this could be reactive or related to HIV but CT is unable to exclude additional etiologies such as lymphoma and metastatic disease. Question dependent calculi in urinary bladder versus mural calcification. 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 Aortic Atherosclerosis (ICD10-I70.0). Electronically Signed   By: Lavonia Dana M.D.   On: 12/06/2022 13:51   DG Chest Port 1 View  Result Date: 12/06/2022 CLINICAL DATA:  Sepsis. EXAM: PORTABLE CHEST 1 VIEW COMPARISON:  August 26, 2022. FINDINGS: The heart size and mediastinal contours are within normal limits. Both lungs are clear. The visualized skeletal structures are unremarkable. IMPRESSION: No active disease. Electronically Signed   By: Marijo Conception M.D.   On: 12/06/2022 10:52    Procedures Procedures    Medications Ordered in ED Medications  lactated ringers infusion ( Intravenous New Bag/Given 12/06/22 1357)  lactated ringers bolus 1,000 mL (1,000 mLs Intravenous New Bag/Given 12/06/22 1357)   metroNIDAZOLE (FLAGYL) IVPB 500 mg (500 mg Intravenous New Bag/Given 12/06/22 1356)  vancomycin (VANCOREADY) IVPB 1500 mg/300 mL (1,500 mg Intravenous New Bag/Given 12/06/22 1355)  ceFEPIme (MAXIPIME) 2 g in sodium chloride 0.9 % 100 mL IVPB (has no administration in time range)  vancomycin (VANCOREADY) IVPB 750 mg/150 mL (has no administration in time range)  lactated ringers bolus 1,000 mL (0 mLs Intravenous Stopped 12/06/22 1132)  ceFEPIme (MAXIPIME) 2 g in sodium chloride 0.9 % 100 mL IVPB (0 g Intravenous Stopped 12/06/22 1315)  iohexol (OMNIPAQUE) 300 MG/ML solution 100 mL (100 mLs Intravenous Contrast Given 12/06/22 1329)    ED Course/ Medical Decision Making/ A&P                             Medical Decision Making Amount and/or Complexity of Data Reviewed Labs: ordered. Radiology: ordered. ECG/medicine tests: ordered.  Risk Prescription drug management.   This patient presents to the ED for concern of failure to thrive, this involves an extensive number of treatment options, and is a complaint that carries with it a high risk of complications and morbidity.  The differential diagnosis includes sepsis, dehydration, wound infections, malnutrition, deconditioning   Co morbidities that complicate the patient evaluation  bipolar disorder, HIV, HTN, seizures, neuropathy, alcohol dependence, GERD, cirrhosis   Additional history obtained:  Additional history obtained from EMS External records from outside source obtained and reviewed including EMR   Lab Tests:  I Ordered, and personally interpreted labs.  The pertinent results include: Leukocytosis and lactic acidosis consistent with sepsis.  Creatinine is baseline.  Electrolytes are normal.  Anemia is baseline.  Urinalysis shows sterile pyuria with ketonuria consistent with starvation ketosis.  Glucose is normal.   Imaging Studies ordered:  I ordered imaging studies including chest x-ray, CT of abdomen and pelvis I  independently visualized and interpreted imaging which showed no acute findings on x-ray, CT scan showed mild rectal wall thickening  concerning for proctitis, increased iliac and inguinal adenopathy. I agree with the radiologist interpretation   Cardiac Monitoring: / EKG:  The patient was maintained on a cardiac monitor.  I personally viewed and interpreted the cardiac monitored which showed an underlying rhythm of: Sinus rhythm  Problem List / ED Course / Critical interventions / Medication management  Patient presents from home by EMS for initial concerns of failure to thrive.  He does require home nursing care.  He states that he has not been able to walk in years.  His home health aide reportedly quit 2 days ago and he has been unable to eat, drink, or be cleaned.  Patient arrives soiled in urine and stool.  Initial vital signs are notable for tachycardia.  He has extensive wounds to his gluteal area.  He denies any associated pain to this area.  He denies any other areas of discomfort.  He simply states that he wants something to drink.  Patient was given water and ginger ale.  Wounds were cleaned and Mepilex was placed.  Laboratory workup was initiated.  Early lab work is notable for a lactic acidosis and leukocytosis.  This does meet criteria for sepsis.  Broad-spectrum antibiotics were ordered.  Given the extensive wounds to his gluteal and perennial area, patient to undergo CT imaging of abdomen pelvis.  CT scan showed moderate wall thickening concerning for proctitis.  There is also increased external iliac and inguinal adenopathy.  Patient remained hemodynamically stable.  He was admitted to hospitalist for further management. I ordered medication including IV fluids and broad-spectrum antibiotics for empiric treatment of sepsis Reevaluation of the patient after these medicines showed that the patient improved I have reviewed the patients home medicines and have made adjustments as  needed   Social Determinants of Health:  Requires caregiver support.  Home health aide quit 2 days ago.  CRITICAL CARE Performed by: Godfrey Pick   Total critical care time: 32 minutes  Critical care time was exclusive of separately billable procedures and treating other patients.  Critical care was necessary to treat or prevent imminent or life-threatening deterioration.  Critical care was time spent personally by me on the following activities: development of treatment plan with patient and/or surrogate as well as nursing, discussions with consultants, evaluation of patient's response to treatment, examination of patient, obtaining history from patient or surrogate, ordering and performing treatments and interventions, ordering and review of laboratory studies, ordering and review of radiographic studies, pulse oximetry and re-evaluation of patient's condition.         Final Clinical Impression(s) / ED Diagnoses Final diagnoses:  Proctitis  Sepsis without acute organ dysfunction, due to unspecified organism [facility])  Wound of gluteal cleft, unspecified laterality, subsequent encounter    Rx / DC Orders ED Discharge Orders     None         Godfrey Pick, MD 12/06/22 1445

## 2022-12-06 NOTE — Progress Notes (Signed)
Pharmacy Antibiotic Note  [name] is a 37 y.o. male admitted on 12/06/2022 with  unknown source of infection .  Pharmacy has been consulted for vancomycin and cefepime dosing.  Plan: Vancomycin 1500 mg IV x 1 dose. Vancomycin 750 mg IV every 12 hours. Cefepime 2000 mg IV every 8 hours. Monitor labs, c/s, and vanco levels as indicated.  Height: '6\' 3"'$  (190.5 cm) Weight: 67 kg (147 lb 11.3 oz) IBW/kg (Calculated) : 84.5  Temp (24hrs), Avg:98.2 F (36.8 C), Min:98.2 F (36.8 C), Max:98.2 F (36.8 C)  Recent Labs  Lab 12/03/22 0704 12/06/22 1117 12/06/22 1303  WBC 12.5* 14.4*  --   CREATININE 0.98 1.16  --   LATICACIDVEN  --  2.5* 1.8    Estimated Creatinine Clearance: 81 mL/min (by C-G formula based on SCr of 1.16 mg/dL).    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  Margot Ables, PharmD Clinical Pharmacist 12/06/2022 2:13 PM

## 2022-12-06 NOTE — H&P (Signed)
Patient Demographics:   [name], is a 37 y.o. male  MRN: [id number]   DOB - [birth year]  Admit Date - 12/06/2022  Outpatient Primary MD for the patient is [name], MD   Assessment & Plan:   Assessment and Plan:    HIV,  , HTN, FTT, Chronic hidradenitis suppurativa involving groin creases, pubic region, buttocks , Anemia of chronic disease, alcoholic liver cirrhosis, GERD and history of orthostatic hypotension in the setting of autonomic dysfunction on chronic decubitus ulcers in the setting of hidradenitis suppurativa presents from home by EMS ??  Altered mentation in the setting of poor oral intake--- he apparently has a home health CNA--- who quit about a week ago show patient does not have    1) acute metabolic encephalopathy--??  Due to UTI , proctitis and dehydration -Apparently patient has been less awake and at times more confused -No fevers no emesis -COVID,  flu and RSV negative -Hydrate and treat infection  2)Proctitis--- patient received cefepime and Vanco in the ED --CT abdomen pelvis with possible proctitis WBC is up to 14.4, lactic acid is 2.5 Treat empirically with Rocephin and Flagyl  3)UTI-- +ve Dysuria, --UA suggestive of UTI -WBC is up to 14.4, lactic acid is 2.5 IV Rocephin as above pending further culture data  4)Anemia of chronic disease - -Hemoglobin 8.2 which is close to recent baseline -No bleeding concerns at this time monitor closely -Anticipate follow-up drop in H&H due to hemodilution from IV fluids  5)HIV, on HAART Cont home meds.  Biktarvy -Patient sees infectious disease at [facility]    6)Failure to thrive in adult -Give Ensure nutritional supplements Liberalize diet.  On folic acid, multivitamin, thiamine   7)Bipolar 1 disorder/Schizoaffective  disorder/PTSD/Depression/anxiety-- Resume home regimen-including Restoril, trazodone, Lexapro and Latuda -Xanax as ordered -History of noncompliance with medications  8)Orthostatic hypotension/autonomic dysfunction -Patient is bedbound -- Increased Midodrine to 15 mg p.o. 3 times daily AC  9)GERD -Pepcid as ordered  10)Chronic hidradenitis suppurativa involving groin creases, pubic region, buttocks --see photos in epic -Decubitus ulcers-noted they do not look infected at this time -Some of the wounds are deep and tracking  Status is: Inpatient  Remains inpatient appropriate because:   Dispo: The patient is from: Home              Anticipated d/c is to: Home with [facility]              Anticipated d/c date is: 2 days              Patient currently is not medically stable to d/c. Barriers: Not Clinically Stable-  With History of - Reviewed by me  Past Medical History:  Diagnosis Date   Bipolar 1 disorder ([place])    Depression    Dizziness and giddiness 02/01/2016   Failure to thrive in adult    Herpes genitalia    HIV disease ([place])    Hypertension    Hyponatremia  Hypothermia 08/24/2021   Migraine headache 02/01/2016   Peripheral neuropathy 10/01/2019   PTSD (post-traumatic stress disorder)    Schizoaffective disorder ([place])    Seizures ([place])       Past Surgical History:  Procedure Laterality Date   BACK SURGERY     BIOPSY  02/26/2021   Procedure: BIOPSY;  Surgeon: Otis Brace, MD;  Location: [facility];  Service: Gastroenterology;;   COLONOSCOPY WITH PROPOFOL [name] 02/26/2021   Procedure: COLONOSCOPY WITH PROPOFOL;  Surgeon: Otis Brace, MD;  Location: [facility];  Service: Gastroenterology;  Laterality: [name];   ESOPHAGOGASTRODUODENOSCOPY (EGD) WITH PROPOFOL [name] 02/26/2021   Procedure: ESOPHAGOGASTRODUODENOSCOPY (EGD) WITH PROPOFOL;  Surgeon: Otis Brace, MD;  Location: [facility];  Service: Gastroenterology;  Laterality: [name];    ESOPHAGOGASTRODUODENOSCOPY (EGD) WITH PROPOFOL [name] 05/18/2021   Procedure: ESOPHAGOGASTRODUODENOSCOPY (EGD) WITH PROPOFOL;  Surgeon: Otis Brace, MD;  Location: [facility];  Service: Gastroenterology;  Laterality: [name];   HAND SURGERY     Chief Complaint  Patient presents with   Failure To Thrive      HPI:    [name]  is a 37 y.o. male with PMHx relevant for  HIV, Bipolar 1 disorder/Schizoaffective disorder/PTSD/Depression/anxiety , HTN, FTT, Chronic hidradenitis suppurativa involving groin creases, pubic region, buttocks , Anemia of chronic disease, alcoholic liver cirrhosis, GERD and history of orthostatic hypotension in the setting of autonomic dysfunction on chronic decubitus ulcers in the setting of hidradenitis suppurativa presents from home by EMS ??  Altered mentation in the setting of poor oral intake--- he apparently has a home health CNA--- who quit about a week ago show patient does not have sufficient care at home. -Patient states she has not had oral intake for 2 days --Apparently patient has been less awake and at times more confused -He was covered in feces when EMS got there he had a low blood pressure EMS gave him NS bolus of 300 mL x 1 -No fevers no emesis, no BM in about 2 days -??  Dysuria -No cough, no abdominal pain -CT abdomen pelvis with possible proctitis -Chest x-ray without acute findings -UA suggestive of UTI -WBC is up to 14.4, lactic acid is 2.5 -Hemoglobin 8.2 -COVID,  flu and RSV negative -Creatinine 1.1 bicarb 20, sodium 136 potassium 4.1 LFTs are not elevated except for T. bili of 1.4    Review of systems:    In addition to the HPI above,   A full Review of  Systems was done, all other systems reviewed are negative except as noted above in HPI , .    Social History:  Reviewed by me    Social History   Tobacco Use   Smoking status: Every Day    Packs/day: 1.00    Types: Cigarettes   Smokeless tobacco: Never  Substance Use  Topics   Alcohol use: Yes    Alcohol/week: 14.0 standard drinks of alcohol    Types: 14 Cans of beer per week    Comment: 5-6 40oz per day     Family History :  Reviewed by me    Family History  Problem Relation Age of Onset   Alcohol abuse Mother    Schizophrenia Father    Depression Father    Alcohol abuse Father    Alcohol abuse Paternal Uncle    Alcohol abuse Paternal Uncle      Home Medications:   Prior to Admission medications   Medication Sig Start Date End Date Taking? Authorizing Provider  acetaminophen (TYLENOL) 500 MG  tablet Take 1,000 mg by mouth every 6 (six) hours as needed for moderate pain or mild pain.   Yes [provider]  ALPRAZolam (XANAX) 0.5 MG tablet Take 1 tablet (0.5 mg total) by mouth 3 (three) times daily as needed for anxiety. 08/30/22  Yes Amin, Ankit Chirag, MD  bictegravir-emtricitabine-tenofovir AF (BIKTARVY) 50-200-25 MG TABS tablet Take 1 tablet by mouth daily. 08/30/22  Yes Amin, Jeanella Flattery, MD  cyclobenzaprine (FLEXERIL) 10 MG tablet Take 10 mg by mouth 3 (three) times daily as needed. 11/18/22  Yes [provider]  diphenoxylate-atropine (LOMOTIL) 2.5-0.025 MG tablet Take 1 tablet by mouth daily. 11/18/22  Yes [provider]  escitalopram (LEXAPRO) 20 MG tablet Take 1 tablet (20 mg total) by mouth daily. 08/30/22  Yes Amin, Jeanella Flattery, MD  famotidine (PEPCID) 40 MG tablet Take 40 mg by mouth every morning. 11/18/22  Yes [provider]  furosemide (LASIX) 40 MG tablet Take 40 mg by mouth daily as needed. 11/18/22  Yes [provider]  ibuprofen (ADVIL) 200 MG tablet Take 600 mg by mouth every 6 (six) hours as needed for mild pain or moderate pain.   Yes [provider]  lip balm (CARMEX) ointment Apply topically as needed for lip care. 07/07/22  Yes Allie Bossier, MD  Lurasidone HCl 120 MG TABS Take 1 tablet (120 mg total) by mouth daily. 08/30/22  Yes Amin, Jeanella Flattery, MD  Multiple Vitamin  (MULTIVITAMIN WITH MINERALS) TABS tablet Take 1 tablet by mouth daily. 09/01/21  Yes Sheikh, Omair Latif, DO  ondansetron (ZOFRAN) 8 MG tablet Take 8 mg by mouth every 8 (eight) hours as needed. 11/18/22  Yes [provider]  senna-docusate (SENOKOT-S) 8.6-50 MG tablet Take 1 tablet by mouth at bedtime as needed for moderate constipation. 08/30/22  Yes Amin, Ankit Chirag, MD  temazepam (RESTORIL) 30 MG capsule Take 30 mg by mouth at bedtime. 10/11/22  Yes [provider]  thiamine (VITAMIN B1) 100 MG tablet Take 1 tablet (100 mg total) by mouth daily. 08/30/22  Yes Amin, Jeanella Flattery, MD  trazodone (DESYREL) 300 MG tablet Take 300 mg by mouth at bedtime. 11/18/22  Yes [provider]  trolamine salicylate (ASPERCREME) 10 % cream Apply 1 Application topically as needed for muscle pain.   Yes [provider]  valACYclovir (VALTREX) 1000 MG tablet Take 1 tablet (1,000 mg total) by mouth daily. 08/30/22  Yes Amin, Ankit Chirag, MD  albuterol (VENTOLIN HFA) 108 (90 Base) MCG/ACT inhaler Inhale 1 puff into the lungs every 6 (six) hours as needed for wheezing or shortness of breath. 08/30/22 11/25/22  Amin, Jeanella Flattery, MD  feeding supplement (ENSURE ENLIVE / ENSURE PLUS) LIQD Take 237 mLs by mouth 2 (two) times daily between meals. 03/04/22   Florencia Reasons, MD  folic acid (FOLVITE) 1 MG tablet Take 1 tablet (1 mg total) by mouth daily. Patient not taking: Reported on 12/06/2022 08/30/22   Damita Lack, MD  haloperidol (HALDOL) 5 MG tablet Take 1 tablet (5 mg total) by mouth daily. Patient not taking: Reported on 12/06/2022 08/30/22   Damita Lack, MD  HYDROcodone-acetaminophen (NORCO/VICODIN) 5-325 MG tablet Take 1 tablet by mouth 2 (two) times daily as needed. Patient not taking: Reported on 12/06/2022 11/18/22   [provider]  nystatin (MYCOSTATIN/NYSTOP) powder Apply topically 2 (two) times daily. Patient not taking: Reported on 12/06/2022 07/07/22   Allie Bossier, MD  pantoprazole (PROTONIX) 40 MG tablet Take 1 tablet (  40 mg total) by mouth daily. Patient not taking: Reported on 12/06/2022 08/30/22   Damita Lack, MD  Polyethyl Glycol-Propyl Glycol (SYSTANE OP) Place 1-2 drops into both eyes daily as needed (Dry eyes). Patient not taking: Reported on 12/06/2022    [provider]     Allergies:     Allergies  Allergen Reactions   Aczone [Dapsone] Other (See Comments)    Per [facility], "G6PD deficient"   Pollen Extract Itching and Other (See Comments)    Sneezing   Primaquine Phosphate Other (See Comments)    Per [facility], "G6PD deficient"     Physical Exam:   Vitals  Blood pressure 93/65, pulse (!) 109, temperature 99.1 F (37.3 C), temperature source Oral, resp. rate 18, height '6\' 3"'$  (1.905 m), weight 67 kg, SpO2 100 %.  Physical Examination: General appearance - alert,  in no distress, frail and chronically ill and cachectic appearing Mental status - alert, oriented to person, place, and time,  Eyes - sclera anicteric Neck - supple, no JVD elevation , Chest - clear  to auscultation bilaterally, symmetrical air movement,  Heart - S1 and S2 normal, regular  Abdomen - soft, nontender, nondistended, +BS Neurological -chronic neuromuscular deficits with contractures,  -extremities - no pedal edema noted, intact peripheral pulses  Skin -Decubitus ulcers-noted they do not look infected at this time -Some of the wounds are deep and tracking -  Media Information  Document Information  Photos    12/06/2022 12:14  Attached To:  Hospital Encounter on 12/06/22  Source Information  Godfrey Pick, MD  [facility]     Data Review:    CBC Recent Labs  Lab 12/03/22 0704 12/06/22 1117  WBC 12.5* 14.4*  HGB 8.5* 8.2*  HCT 27.8* 27.8*  PLT 469* 627*  MCV 93.3 94.2  MCH 28.5 27.8  MCHC 30.6 29.5*  RDW 14.2 14.6  LYMPHSABS 1.9 2.8  MONOABS 0.9 0.6  EOSABS 0.0 0.1  BASOSABS 0.1 0.1    ------------------------------------------------------------------------------------------------------------------  Chemistries  Recent Labs  Lab 12/03/22 0704 12/06/22 1117  NA 134* 136  K 4.0 4.1  CL 96* 100  CO2 25 20*  GLUCOSE 125* 94  BUN 14 14  CREATININE 0.98 1.16  CALCIUM 9.6 9.5  MG  --  2.0  AST 22 22  ALT 8 8  ALKPHOS 99 90  BILITOT 0.3 1.4*   ------------------------------------------------------------------------------------------------------------------ estimated creatinine clearance is 81 mL/min (by C-G formula based on SCr of 1.16 mg/dL). ------------------------------------------------------------------------------------------------------------------ Coagulation profile Recent Labs  Lab 12/06/22 1117  INR 1.2   ------------------------------------------------------------------------------    Component Value Date/Time   BNP 220.9 (H) 05/11/2021 0847   Urinalysis    Component Value Date/Time   COLORURINE AMBER (A) 12/06/2022 1400   APPEARANCEUR HAZY (A) 12/06/2022 1400   LABSPEC 1.026 12/06/2022 1400   PHURINE 5.0 12/06/2022 1400   GLUCOSEU NEGATIVE 12/06/2022 1400   HGBUR NEGATIVE 12/06/2022 1400   BILIRUBINUR SMALL (A) 12/06/2022 1400   KETONESUR 80 (A) 12/06/2022 1400   PROTEINUR 100 (A) 12/06/2022 1400   UROBILINOGEN 0.2 12/21/2014 1006   NITRITE NEGATIVE 12/06/2022 1400   LEUKOCYTESUR TRACE (A) 12/06/2022 1400    ----------------------------------------------------------------------------------------------------------------   Imaging Results:    CT ABDOMEN PELVIS W CONTRAST  Result Date: 12/06/2022 CLINICAL DATA:  Sepsis, not eating for 2 days; history of PTSD, substance abuse, psychiatric disease, EXAM: CT ABDOMEN AND PELVIS WITH CONTRAST TECHNIQUE: Multidetector CT imaging of the abdomen and pelvis was performed using the standard protocol following  bolus administration of intravenous contrast. RADIATION DOSE REDUCTION: This exam was  performed according to the departmental dose-optimization program which includes automated exposure control, adjustment of the mA and/or kV according to patient size and/or use of iterative reconstruction technique. CONTRAST:  139m OMNIPAQUE IOHEXOL 300 MG/ML SOLN IV. No oral contrast. COMPARISON:  06/27/2022 FINDINGS: Lower chest: Lung bases clear Hepatobiliary: Gallbladder and liver normal appearance Pancreas: Normal appearance Spleen: Normal appearance Adrenals/Urinary Tract: Bladder decompressed with minimal dependent density which could represent tiny calculi or mural calcification. Adrenal glands, kidneys, and ureters unremarkable. No renal mass, hydronephrosis or urinary tract calcification. Stomach/Bowel: Mild rectal wall thickening. Normal appendix. Stomach and remaining bowel loops unremarkable. Vascular/Lymphatic: Premature atherosclerotic calcifications aorta and iliac arteries without aneurysm. External iliac and inguinal adenopathy bilaterally increased from previous exam. LEFT external iliac node 17 mm image 81. RIGHT external iliac node 13 mm image 80. Scattered normal sized retroperitoneal nodes. Reproductive: Unremarkable prostate gland and seminal vesicles Other: No free air or free fluid.  No hernia. Musculoskeletal: Advanced degenerative changes of the LEFT hip joint secondary to avascular necrosis with bone destruction at femoral head, joint space narrowing, articular surface irregularity, and associated joint effusion/debris. IMPRESSION: Mild rectal wall thickening question proctitis. Increased external iliac and inguinal adenopathy; this could be reactive or related to HIV but CT is unable to exclude additional etiologies such as lymphoma and metastatic disease. Question dependent calculi in urinary bladder versus mural calcification. 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  Aortic Atherosclerosis (ICD10-I70.0). Electronically Signed   By: MLavonia DanaM.D.   On: 12/06/2022 13:51   DG Chest Port 1 View  Result Date: 12/06/2022 CLINICAL DATA:  Sepsis. EXAM: PORTABLE CHEST 1 VIEW COMPARISON:  August 26, 2022. FINDINGS: The heart size and mediastinal contours are within normal limits. Both lungs are clear. The visualized skeletal structures are unremarkable. IMPRESSION: No active disease. Electronically Signed   By: JMarijo ConceptionM.D.   On: 12/06/2022 10:52    Radiological Exams on Admission: CT ABDOMEN PELVIS W CONTRAST  Result Date: 12/06/2022 CLINICAL DATA:  Sepsis, not eating for 2 days; history of PTSD, substance abuse, psychiatric disease, EXAM: CT ABDOMEN AND PELVIS WITH CONTRAST TECHNIQUE: Multidetector CT imaging of the abdomen and pelvis was performed using the standard protocol following bolus administration of intravenous contrast. RADIATION DOSE REDUCTION: This exam was performed according to the departmental dose-optimization program which includes automated exposure control, adjustment of the mA and/or kV according to patient size and/or use of iterative reconstruction technique. CONTRAST:  1059mOMNIPAQUE IOHEXOL 300 MG/ML SOLN IV. No oral contrast. COMPARISON:  06/27/2022 FINDINGS: Lower chest: Lung bases clear Hepatobiliary: Gallbladder and liver normal appearance Pancreas: Normal appearance Spleen: Normal appearance Adrenals/Urinary Tract: Bladder decompressed with minimal dependent density which could represent tiny calculi or mural calcification. Adrenal glands, kidneys, and ureters unremarkable. No renal mass, hydronephrosis or urinary tract calcification. Stomach/Bowel: Mild rectal wall thickening. Normal appendix. Stomach and remaining bowel loops unremarkable. Vascular/Lymphatic: Premature atherosclerotic calcifications aorta and iliac arteries without aneurysm. External iliac and inguinal adenopathy bilaterally increased from previous exam. LEFT  external iliac node 17 mm image 81. RIGHT external iliac node 13 mm image 80. Scattered normal sized retroperitoneal nodes. Reproductive: Unremarkable prostate gland and seminal vesicles Other: No free air or free fluid.  No hernia. Musculoskeletal: Advanced degenerative changes of the LEFT hip joint secondary to avascular necrosis with bone destruction at femoral  head, joint space narrowing, articular surface irregularity, and associated joint effusion/debris. IMPRESSION: Mild rectal wall thickening question proctitis. Increased external iliac and inguinal adenopathy; this could be reactive or related to HIV but CT is unable to exclude additional etiologies such as lymphoma and metastatic disease. Question dependent calculi in urinary bladder versus mural calcification. 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 Aortic Atherosclerosis (ICD10-I70.0). Electronically Signed   By: Lavonia Dana M.D.   On: 12/06/2022 13:51   DG Chest Port 1 View  Result Date: 12/06/2022 CLINICAL DATA:  Sepsis. EXAM: PORTABLE CHEST 1 VIEW COMPARISON:  August 26, 2022. FINDINGS: The heart size and mediastinal contours are within normal limits. Both lungs are clear. The visualized skeletal structures are unremarkable. IMPRESSION: No active disease. Electronically Signed   By: Marijo Conception M.D.   On: 12/06/2022 10:52    DVT Prophylaxis -subcu Heparin AM Labs Ordered, also please review Full Orders  Family Communication: Admission, patients condition and plan of care including tests being ordered have been discussed with the patient  who indicate understanding and agree with the plan   Condition  -stable  Roxan Hockey M.D on 12/06/2022 at 4:40 PM Go to [email or web address] -  for contact info  [facility] - Office  [phone]

## 2022-12-07 DIAGNOSIS — G9341 Metabolic encephalopathy: Secondary | ICD-10-CM | POA: Diagnosis not present

## 2022-12-07 LAB — BASIC METABOLIC PANEL
Anion gap: 8 (ref 5–15)
BUN: 8 mg/dL (ref 6–20)
CO2: 21 mmol/L — ABNORMAL LOW (ref 22–32)
Calcium: 8.1 mg/dL — ABNORMAL LOW (ref 8.9–10.3)
Chloride: 104 mmol/L (ref 98–111)
Creatinine, Ser: 0.85 mg/dL (ref 0.61–1.24)
GFR, Estimated: >60 mL/min (ref >60)
Glucose, Bld: 74 mg/dL (ref 70–99)
Potassium: 3 mmol/L — ABNORMAL LOW (ref 3.5–5.1)
Sodium: 133 mmol/L — ABNORMAL LOW (ref 135–145)

## 2022-12-07 LAB — FOLATE: Folate: 19.3 ng/mL (ref >5.9)

## 2022-12-07 LAB — CBC
HCT: 25.9 % — ABNORMAL LOW (ref 39.0–52.0)
Hemoglobin: 7.7 g/dL — ABNORMAL LOW (ref 13.0–17.0)
MCH: 27.4 pg (ref 26.0–34.0)
MCHC: 29.7 g/dL — ABNORMAL LOW (ref 30.0–36.0)
MCV: 92.2 fL (ref 80.0–100.0)
Platelets: 449 10*3/uL — ABNORMAL HIGH (ref 150–400)
RBC: 2.81 MIL/uL — ABNORMAL LOW (ref 4.22–5.81)
RDW: 14.2 % (ref 11.5–15.5)
WBC: 9.2 10*3/uL (ref 4.0–10.5)
nRBC: 0 % (ref 0.0–0.2)

## 2022-12-07 LAB — VITAMIN B12: Vitamin B-12: 184 pg/mL (ref 180–914)

## 2022-12-07 MED ORDER — VITAMIN C 500 MG PO TABS
500.0000 mg | ORAL_TABLET | Freq: Every day | ORAL | Status: DC
  Administered 2022-12-07 – 2022-12-15 (×9): 500 mg via ORAL
  Filled 2022-12-07 (×9): qty 1

## 2022-12-07 MED ORDER — MAGNESIUM SULFATE 2 GM/50ML IV SOLN
2.0000 g | Freq: Once | INTRAVENOUS | Status: AC
  Administered 2022-12-07: 2 g via INTRAVENOUS
  Filled 2022-12-07: qty 50

## 2022-12-07 MED ORDER — POTASSIUM CHLORIDE CRYS ER 20 MEQ PO TBCR
40.0000 meq | EXTENDED_RELEASE_TABLET | Freq: Once | ORAL | Status: AC
  Administered 2022-12-07: 40 meq via ORAL
  Filled 2022-12-07: qty 2

## 2022-12-07 MED ORDER — SODIUM CHLORIDE 0.9 % IV BOLUS
500.0000 mL | Freq: Once | INTRAVENOUS | Status: AC
  Administered 2022-12-07: 500 mL via INTRAVENOUS

## 2022-12-07 MED ORDER — PROSOURCE PLUS PO LIQD
30.0000 mL | Freq: Two times a day (BID) | ORAL | Status: DC
  Administered 2022-12-07 – 2022-12-15 (×14): 30 mL via ORAL
  Filled 2022-12-07 (×15): qty 30

## 2022-12-07 MED ORDER — JUVEN PO PACK
1.0000 | PACK | Freq: Two times a day (BID) | ORAL | Status: DC
  Administered 2022-12-07 – 2022-12-15 (×15): 1 via ORAL
  Filled 2022-12-07 (×15): qty 1

## 2022-12-07 MED ORDER — ASCORBIC ACID 500 MG/5ML PO LIQD
500.0000 mg | Freq: Every day | ORAL | Status: DC
  Filled 2022-12-07 (×3): qty 5

## 2022-12-07 MED ORDER — LACTATED RINGERS IV SOLN: INTRAVENOUS | Status: AC

## 2022-12-07 MED ORDER — RISAQUAD PO CAPS
2.0000 | ORAL_CAPSULE | Freq: Three times a day (TID) | ORAL | Status: DC
  Administered 2022-12-07 – 2022-12-08 (×4): 2 via ORAL
  Filled 2022-12-07 (×4): qty 2

## 2022-12-07 MED ORDER — DIPHENHYDRAMINE HCL 25 MG PO CAPS
25.0000 mg | ORAL_CAPSULE | Freq: Three times a day (TID) | ORAL | Status: DC | PRN
  Administered 2022-12-08 – 2022-12-11 (×3): 25 mg via ORAL
  Filled 2022-12-07 (×3): qty 1

## 2022-12-07 NOTE — Evaluation (Signed)
Physical Therapy Evaluation Patient Details Name: [name] MRN: [id number] DOB: Sep 10, 1986 Today's Date: 12/07/2022  History of Present Illness  [name]  is a 37 y.o. male with PMHx relevant for  HIV, Bipolar 1 disorder/Schizoaffective disorder/PTSD/Depression/anxiety , HTN, FTT, Chronic hidradenitis suppurativa involving groin creases, pubic region, buttocks , Anemia of chronic disease, alcoholic liver cirrhosis, GERD and history of orthostatic hypotension in the setting of autonomic dysfunction on chronic decubitus ulcers in the setting of hidradenitis suppurativa presents from home by EMS ??  Altered mentation in the setting of poor oral intake--- he apparently has a home health CNA--- who quit about a week ago show patient does not have sufficient care at home.  -Patient states she has not had oral intake for 2 days  --Apparently patient has been less awake and at times more confused  -He was covered in feces when EMS got there he had a low blood pressure EMS gave him NS bolus of 300 mL x 1  -No fevers no emesis, no BM in about 2 days   Clinical Impression  Patient presents slightly confused and poor historian.  Patient demonstrates slow labored movement for sitting up at bedside with difficulty moving BLE due to hamstring, ankle plantar contractures, able to maintain sitting balance while seated at EOB and unable to attempt sit to stands or transfers due to BLE weakness/contractures.  Patient put back to bed with Max assist for repositioning.  Patient will benefit from continued skilled physical therapy in hospital and recommended venue below to increase strength, balance, endurance for safe ADLs and gait.         Recommendations for follow up therapy are one component of a multi-disciplinary discharge planning process, led by the attending physician.  Recommendations may be updated based on patient status, additional functional criteria and insurance authorization.  Follow Up  Recommendations Skilled nursing-short term rehab (<3 hours/day) Can patient physically be transported by private vehicle: No    Assistance Recommended at Discharge Intermittent Supervision/Assistance  Patient can return home with the following  A lot of help with bathing/dressing/bathroom;A lot of help with walking and/or transfers;Help with stairs or ramp for entrance;Assistance with cooking/housework    Equipment Recommendations None recommended by PT  Recommendations for Other Services       Functional Status Assessment Patient has had a recent decline in their functional status and/or demonstrates limited ability to make significant improvements in function in a reasonable and predictable amount of time     Precautions / Restrictions Precautions Precautions: Fall Restrictions Weight Bearing Restrictions: No      Mobility  Bed Mobility Overal bed mobility: Needs Assistance Bed Mobility: Supine to Sit, Sit to Supine     Supine to sit: Max assist Sit to supine: Max assist   General bed mobility comments: slow labored movement, diffiuclty moving BLE due to hamstring, ankle dorsiflexor contractures    Transfers                        Ambulation/Gait                  Stairs            Wheelchair Mobility    Modified Rankin (Stroke Patients Only)       Balance Overall balance assessment: Needs assistance Sitting-balance support: Feet supported, No upper extremity supported Sitting balance-Leahy Scale: Fair Sitting balance - Comments: seated at EOB  Pertinent Vitals/Pain Pain Assessment Pain Assessment: No/denies pain    Home Living Family/patient expects to be discharged to:: Private residence Living Arrangements: Alone Available Help at Discharge: Family;Available PRN/intermittently;Personal care attendant Type of Home: Apartment Home Access: Stairs to enter Entrance Stairs-Rails:  Right Entrance Stairs-Number of Steps: 3   Home Layout: One level Home Equipment: Conservation officer, nature (2 wheels);Cane - single point;Wheelchair - manual;Hand held shower head;Tub bench;BSC/3in1;Hospital bed      Prior Function Prior Level of Function : Needs assist  Cognitive Assist : ADLs (cognitive)   ADLs (Cognitive): Set up cues Physical Assist : Mobility (physical);ADLs (physical) Mobility (physical): Bed mobility;Transfers;Gait;Stairs   Mobility Comments: mostly bed bound recently, per recent admission notes was walking with RW and using w/c mostly for mobility ADLs Comments: patient had home aide 7 days/week for 3 hours; aide assists with cleaning, cooking, laundry, bathing and dressing, presently does not have home aide per recent ED notes     Hand Dominance   Dominant Hand: Right    Extremity/Trunk Assessment   Upper Extremity Assessment Upper Extremity Assessment: Defer to OT evaluation    Lower Extremity Assessment Lower Extremity Assessment: Generalized weakness    Cervical / Trunk Assessment Cervical / Trunk Assessment: Normal  Communication   Communication: Expressive difficulties  Cognition Arousal/Alertness: Awake/alert Behavior During Therapy: WFL for tasks assessed/performed Overall Cognitive Status: History of cognitive impairments - at baseline                                          General Comments      Exercises     Assessment/Plan    PT Assessment Patient needs continued PT services  PT Problem List Decreased strength;Decreased activity tolerance;Decreased balance;Decreased mobility;Decreased range of motion       PT Treatment Interventions DME instruction;Functional mobility training;Therapeutic activities;Therapeutic exercise;Patient/family education;Wheelchair mobility training;Balance training    PT Goals (Current goals can be found in the Care Plan section)  Acute Rehab PT Goals Patient Stated Goal: return home PT  Goal Formulation: With patient Time For Goal Achievement: 12/21/22 Potential to Achieve Goals: Good    Frequency Min 2X/week     Co-evaluation               AM-PAC PT "6 Clicks" Mobility  Outcome Measure Help needed turning from your back to your side while in a flat bed without using bedrails?: A Lot Help needed moving from lying on your back to sitting on the side of a flat bed without using bedrails?: A Lot Help needed moving to and from a bed to a chair (including a wheelchair)?: Total Help needed standing up from a chair using your arms (e.g., wheelchair or bedside chair)?: Total Help needed to walk in hospital room?: Total Help needed climbing 3-5 steps with a railing? : Total 6 Click Score: 8    End of Session   Activity Tolerance: Patient tolerated treatment well;Patient limited by fatigue Patient left: in bed;with call bell/phone within reach Nurse Communication: Mobility status PT Visit Diagnosis: Unsteadiness on feet (R26.81);Other abnormalities of gait and mobility (R26.89);Muscle weakness (generalized) (M62.81)    Time: [id number] PT Time Calculation (min) (ACUTE ONLY): 21 min   Charges:   PT Evaluation $PT Eval Moderate Complexity: 1 Mod PT Treatments $Therapeutic Activity: 8-22 mins        2:00 PM, 12/07/22 Lonell Grandchild, MPT Physical Therapist with [place]  [place] office [phone] mobile phone

## 2022-12-07 NOTE — Progress Notes (Signed)
Patient was difficult to arouse this morning. [name], NT and I assisted Dr. Roger Shelter in taking photos (for chart) on all wounds on patient. Patient was then given bath and linens changed. Patient is know very awake and eating breakfast tolerating well and took all of his medications whole with water.

## 2022-12-07 NOTE — Hospital Course (Addendum)
[name] is  a 37 year old male apparently bedbound from home with significant history of HIV,  , HTN, FTT, Chronic hidradenitis suppurativa involving groin creases, pubic region, buttocks , Anemia of chronic disease, alcoholic liver cirrhosis, GERD and history of orthostatic hypotension in the setting of autonomic dysfunction on chronic decubitus ulcers in the setting of hidradenitis suppurativa presents from home by EMS ??  Altered mentation in the setting of poor oral intake .   Assessment & Plan:   Principal Problem:   Acute metabolic encephalopathy Active Problems:   UTI (urinary tract infection)   Protein-calorie malnutrition, severe (HCC)   GERD (gastroesophageal reflux disease)   HIV disease (HCC)   Anemia   Avascular necrosis of femoral head, left (HCC)   FTT (failure to thrive) in adult   Alcoholic cirrhosis of liver (HCC)   Bipolar disorder (HCC)   Acute metabolic encephalopathy- -mentation is improved, awake alert, following command this morning -Altered mental status likely related to: UTI, proctitis, dehydration -Multiple hospitalization,  -Vital stable  -COVID,  flu and RSV negative -Hydrate and treat infection    Acute on chronic anemia - Anemia of chronic disease -No active signs of bleeding -Likely multifactorial-exacerbated by proctitis, chronic groin wounds, hemodilution    Latest Ref Rng & Units 12/09/2022    4:43 AM 12/08/2022    4:51 AM 12/07/2022    5:14 AM  CBC  WBC 4.0 - 10.5 K/uL 9.7  9.7  9.2   Hemoglobin 13.0 - 17.0 g/dL 6.5  7.0  7.7   Hematocrit 39.0 - 52.0 % 21.9  23.8  25.9   Platelets 150 - 400 K/uL 425  403  449    -Transfusing 2U PRBC today -Obtaining iron studies, Hemoccult, B12, folate -Discontinue heparin for DVT prophylaxis  proctitis--- patient received cefepime and Vanco in the ED --CT abdomen pelvis with possible proctitis - WBC is up to 14.4 >>>9.7  -  Lactic acid is 2.5 Treat empirically with Rocephin and Flagyl    UTI-- +ve Dysuria, --UA suggestive of UTI -WBC is up to 14.4,  - Lactic acid is 2.5 >> 1.8  - IV Rocephin as above pending further culture data   HIV, on HAART Cont home meds.  Biktarvy -Patient [facility] infectious disease at [facility]    Failure to thrive in adult -Bedbound, -Give Ensure nutritional supplements Liberalize diet.  On folic acid, multivitamin, thiamine   Bipolar 1 disorder/Schizoaffective disorder/PTSD/Depression/anxiety-- Resume home regimen-including Restoril, trazodone, Lexapro and Latuda -Xanax as ordered -History of noncompliance with medications   Orthostatic hypotension/autonomic dysfunction -Patient is bedbound -- Increased Midodrine to 15 mg p.o. 3 times daily AC   GERD -Pepcid as ordered   Chronic hidradenitis suppurativa involving groin creases, pubic region, buttocks --see photos in epic -Decubitus ulcers-noted they do not look infected at this time -Some of the wounds are deep and tracking   Status is: Inpatient   Remains inpatient appropriate because:    Dispo: The patient is from: Home              Anticipated d/c is to: SNF              Anticipated d/c date is: 2 days              Patient currently is not medically stable to d/c. Barriers: Not Clinically Stable-also difficult to discharge, mom unable to take care of him at home, he is now bedbound, needing assist with all ADLs

## 2022-12-07 NOTE — [place] FL2 (Addendum)
[place] MEDICAID FL2 LEVEL OF CARE FORM     IDENTIFICATION  Patient Name: [name] Birthdate: [birth year] Sex: male Admission Date (Current Location): 12/06/2022  [facility] and [place] Number:  [employer] and Address:  [place] [address] [place]      Provider Number: [id number]  Attending Physician Name and Address:  Deatra James, MD  Relative Name and Phone Number:  [place] (Mother) [phone]    Current Level of Care: Hospital Recommended Level of Care: SNF Prior Approval Number:    Date Approved/Denied:   PASRR Number:    Discharge Plan: Domiciliary (Rest home)    Current Diagnoses: Patient Active Problem List   Diagnosis Date Noted   UTI (urinary tract infection) 12/06/2022   Pressure injury of skin 08/28/2022   AMS (altered mental status) 08/26/2022   AKI (acute kidney injury) ([place]) 06/24/2022   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) 10/09/2021   Avascular necrosis of femoral head, left ([place]) [id number]   Alcoholic cirrhosis of liver without ascites ([place]) 10/07/2021   Cellulitis of multiple sites of buttock 10/07/2021   Hypotension    Hypoxia 08/24/2021   Left hip pain    Transaminitis    Acute metabolic encephalopathy [id number]   Cellulitis 05/07/2021   Lymphadenopathy 09/29/2020   Anemia    Upper urinary tract infection    Sepsis secondary to UTI ([place]) 03/14/2020    Cellulitis of groin 03/14/2020   HIV disease ([place])    Macrocytic anemia    Thrombocytopenia (HCC)    Prolonged QT interval    Acute respiratory failure due to COVID-19 ([place]) 10/27/2019   GERD (gastroesophageal reflux disease) 10/27/2019   Hypokalemia 10/27/2019   Peripheral neuropathy 10/01/2019   PTSD (post-traumatic stress disorder) 07/23/2018   Dizziness and giddiness 02/01/2016   Migraine headache 02/01/2016   Schizoaffective disorder, depressive type ([place]) 05/13/2015   Severe alcohol dependence ([place]) 05/13/2015   Suicidal ideation 01/12/2014    Orientation RESPIRATION BLADDER Height & Weight     Self, Time, Situation, Place  Normal Continent, External catheter Weight: 67 kg Height:  '6\' 3"'$  (190.5 cm)  BEHAVIORAL SYMPTOMS/MOOD NEUROLOGICAL BOWEL NUTRITION STATUS      Incontinent Diet (DC Summary)  AMBULATORY STATUS COMMUNICATION OF NEEDS Skin     Verbally PU Stage and Appropriate Care (see DC Summary)                       Personal Care Assistance Level of Assistance  Bathing, Feeding, Dressing Bathing Assistance: Limited assistance Feeding assistance: Independent Dressing Assistance: Limited assistance     Functional Limitations Info  Sight, Hearing, Speech Sight Info: Adequate Hearing Info: Adequate Speech Info: Adequate    SPECIAL CARE FACTORS FREQUENCY  PT (By licensed PT)     PT Frequency: 5 times a week              Contractures Contractures Info: Not present    Additional Factors Info  Code Status,  Allergies, Psychotropic Code Status Info: Full Allergies Info: aczone, Pllen, primaquine Psychotropic Info: see MAR         Current Medications (12/07/2022):  This is the current hospital active medication list Current Facility-Administered Medications  Medication Dose Route Frequency Provider Last Rate Last Admin   (feeding supplement) PROSource Plus liquid 30 mL  30 mL Oral BID BM Shahmehdi, Seyed A, MD       0.9 %  sodium chloride infusion    Intravenous PRN Denton Brick, Courage, MD       acetaminophen (TYLENOL) tablet 650 mg  650 mg Oral Q6H PRN Roxan Hockey, MD   650 mg at 12/06/22 1813   Or   acetaminophen (TYLENOL) suppository 650 mg  650 mg Rectal Q6H PRN Emokpae, Courage, MD       acidophilus (RISAQUAD) capsule 2 capsule  2 capsule Oral TID Skipper Cliche A, MD   2 capsule at 12/07/22 1041   ALPRAZolam (XANAX) tablet 0.5 mg  0.5 mg Oral TID PRN Roxan Hockey, MD   0.5 mg at 12/06/22 2054   ascorbic acid (VITAMIN C) tablet 500 mg  500 mg Oral Daily Shahmehdi, Seyed A, MD       bictegravir-emtricitabine-tenofovir AF (BIKTARVY) 50-200-25 MG per tablet 1 tablet  1 tablet Oral Daily Emokpae, Courage, MD   1 tablet at 12/07/22 1043   bisacodyl (DULCOLAX) suppository 10 mg  10 mg Rectal Daily PRN Emokpae, Courage, MD       cefTRIAXone (ROCEPHIN) 1 g in sodium chloride 0.9 % 100 mL IVPB  1 g Intravenous Q24H Emokpae, Courage, MD 200 mL/hr at 12/06/22 2041 1 g at 12/06/22 2041   cyclobenzaprine (FLEXERIL) tablet 10 mg  10 mg Oral TID PRN Roxan Hockey, MD       diphenoxylate-atropine (LOMOTIL) 2.5-0.025 MG per tablet 1 tablet  1 tablet Oral BID PRN Emokpae, Courage, MD       escitalopram (LEXAPRO) tablet 20 mg  20 mg Oral Daily Emokpae, Courage, MD   20 mg at 12/07/22 1042   famotidine (PEPCID) tablet 40 mg  40 mg Oral q morning Emokpae, Courage, MD   40 mg at 12/07/22 1039   feeding supplement (ENSURE ENLIVE / ENSURE PLUS) liquid 237 mL  237 mL Oral BID BM Emokpae, Courage, MD   237 mL at 12/07/22 1045   heparin injection 5,000 Units  5,000 Units Subcutaneous Q8H Emokpae, Courage, MD   5,000 Units at 12/07/22 0533   lactated ringers infusion   Intravenous Continuous Emokpae, Courage, MD 150 mL/hr at 12/07/22 0407 New Bag at 12/07/22 0407   lurasidone (LATUDA) tablet 120 mg  120 mg Oral Daily Emokpae, Courage, MD   120 mg at 12/07/22 1040   metroNIDAZOLE (FLAGYL) IVPB 500 mg  500 mg Intravenous Q12H Emokpae, Courage, MD 100 mL/hr  at 12/07/22 1020 500 mg at 12/07/22 1020   midodrine (PROAMATINE) tablet 10 mg  10 mg Oral TID WC Emokpae, Courage, MD   10 mg at 12/07/22 1041   multivitamin with minerals tablet 1 tablet  1 tablet Oral Daily Roxan Hockey, MD   1 tablet at 12/07/22 1041   nutrition supplement (JUVEN) (JUVEN) powder packet 1 packet  1 packet Oral BID BM Shahmehdi, Seyed A, MD       polyethylene glycol (MIRALAX / GLYCOLAX) packet 17 g  17 g Oral Daily PRN Emokpae, Courage, MD       senna-docusate (Senokot-S) tablet 2 tablet  2 tablet Oral QHS Roxan Hockey, MD  2 tablet at 12/06/22 2042   sodium chloride flush (NS) 0.9 % injection 3 mL  3 mL Intravenous Q12H Emokpae, Courage, MD   3 mL at 12/07/22 1044   sodium chloride flush (NS) 0.9 % injection 3 mL  3 mL Intravenous Q12H Emokpae, Courage, MD   3 mL at 12/07/22 1044   sodium chloride flush (NS) 0.9 % injection 3 mL  3 mL Intravenous PRN Emokpae, Courage, MD       temazepam (RESTORIL) capsule 30 mg  30 mg Oral QHS Emokpae, Courage, MD   30 mg at 12/06/22 2041   thiamine (VITAMIN B1) tablet 100 mg  100 mg Oral Daily Emokpae, Courage, MD   100 mg at 12/07/22 1044   traZODone (DESYREL) tablet 300 mg  300 mg Oral QHS Emokpae, Courage, MD   300 mg at 12/06/22 2042   valACYclovir (VALTREX) tablet 1,000 mg  1,000 mg Oral Daily Emokpae, Courage, MD   1,000 mg at 12/07/22 1042     Discharge Medications: Please see discharge summary for a list of discharge medications.  Relevant Imaging Results:  Relevant Lab Results:   Additional Information SS# [id number]  Boneta Lucks, RN

## 2022-12-07 NOTE — Progress Notes (Signed)
Initial Nutrition Assessment  DOCUMENTATION CODES:   Severe malnutrition in context of chronic illness  INTERVENTION:  -Boost High Protein with meals  -1 packet Juven BID, to support wound healing   -Prosource Plus TID with meals  -Vitamin C 500 mg daily  -Request current weight  -Thiamine 100 mg daily  -Check folic acid and 0000000  NUTRITION DIAGNOSIS:   Severe Malnutrition related to chronic illness, catabolic illness (cirrhosis, HIV, numerous chronic pressure injuries) as evidenced by energy intake < or equal to 75% for > or equal to 1 month, percent weight loss (17% x 3 months).   GOAL:  Patient will meet greater than or equal to 90% of their needs   MONITOR:  PO intake, Supplement acceptance, Skin, Weight trends, Labs  REASON FOR ASSESSMENT:   Malnutrition Screening Tool    ASSESSMENT: Patient is an underweight 37 yo male who presents from home with hx of cirrhosis, failure to thrive, HIV, ETOH dependence, schizophrenia and chronic pressure injuries. Bed bound per chart.  Patient breakfast tray is here and 25-50% consumed. Reports good appetite but question adequacy of intake given his low weight for height and history. Encouraged ONS given his poor meal intake. He is agreeable to drink either chocolate or vanilla. Will provide with trays.   Medications reviewed. Request current weight to verify - appears to be reported weight vs actual. According to chart patient weight has decreased from 109.5 kg 08/28/21 to current 67 kg. Severe loss of 17% x 3 months and 39% loss in the past 27 months.      Latest Ref Rng & Units 12/07/2022    5:14 AM 12/06/2022   11:17 AM 12/03/2022    7:04 AM  BMP  Glucose 70 - 99 mg/dL 74  94  125   BUN 6 - 20 mg/dL '8  14  14   '$ Creatinine 0.61 - 1.24 mg/dL 0.85  1.16  0.98   Sodium 135 - 145 mmol/L 133  136  134   Potassium 3.5 - 5.1 mmol/L 3.0  4.1  4.0   Chloride 98 - 111 mmol/L 104  100  96   CO2 22 - 32 mmol/L '21  20  25   '$ Calcium  8.9 - 10.3 mg/dL 8.1  9.5  9.6       NUTRITION - FOCUSED PHYSICAL EXAM: Patient refused - said, " I am sensitive".   Diet Order:   Diet Order             Diet regular Room service appropriate? Yes; Fluid consistency: Thin  Diet effective now                   EDUCATION NEEDS:  Education needs have been addressed  Skin:  Skin Assessment:  (numerous areas of skin breakdown - please see nursing skin assessment for details) and photograph in Dr Dixon's assessment.   Last BM:  2/27  Height:   Ht Readings from Last 1 Encounters:  12/06/22 '6\' 3"'$  (1.905 m)    Weight:   Wt Readings from Last 1 Encounters:  12/06/22 67 kg    Adjusted Ideal Body Weight:   80 kg  BMI:  Body mass index is 18.46 kg/m.  Estimated Nutritional Needs:   Kcal:  2200-2400  Protein:  100-120 gr  Fluid:  2 liters daily   Colman Cater MS,RD,CSG,LDN Contact: Shea [place]

## 2022-12-07 NOTE — Plan of Care (Signed)
  Problem: Acute Rehab PT Goals(only PT should resolve) Goal: Pt will Roll Supine to Side Outcome: Progressing Flowsheets (Taken 12/07/2022 1402) Pt will Roll Supine to Side:  with mod assist  with min assist Goal: Pt Will Go Supine/Side To Sit Outcome: Progressing Flowsheets (Taken 12/07/2022 1402) Pt will go Supine/Side to Sit: with moderate assist Goal: Pt Will Go Sit To Supine/Side Outcome: Progressing Flowsheets (Taken 12/07/2022 1402) Pt will go Sit to Supine/Side: with moderate assist Goal: Patient Will Perform Sitting Balance Outcome: Progressing Flowsheets (Taken 12/07/2022 1402) Patient will perform sitting balance:  with modified independence  with supervision Goal: Pt Will Transfer Bed To Chair/Chair To Bed Outcome: Progressing Flowsheets (Taken 12/07/2022 1402) Pt will Transfer Bed to Chair/Chair to Bed:  with mod assist  with max assist Note: Sliding board   2:03 PM, 12/07/22 Lonell Grandchild, MPT Physical Therapist with [facility] 336 [phone] office [phone] mobile phone

## 2022-12-07 NOTE — Progress Notes (Signed)
Aerobic blood culture bottle positive for gram positive. Notified Dr. Clearence Ped.

## 2022-12-07 NOTE — Progress Notes (Signed)
PROGRESS NOTE    Patient: [name]                            PCP: [name], MD                    DOB: 01/25/1986            DOA: 12/06/2022 [id number]             DOS: 12/07/2022, 1:14 PM   LOS: 1 day   Date of Service: The patient was seen and examined on 12/07/2022  Subjective:   The patient was seen and examined this morning. Hemodynamically stable. No issues overnight .  Brief Narrative:   TYLO KRASZEWSKI is  a 37 year old male apparently bedbound from home with significant history of HIV,  , HTN, FTT, Chronic hidradenitis suppurativa involving groin creases, pubic region, buttocks , Anemia of chronic disease, alcoholic liver cirrhosis, GERD and history of orthostatic hypotension in the setting of autonomic dysfunction on chronic decubitus ulcers in the setting of hidradenitis suppurativa presents from home by EMS ??  Altered mentation in the setting of poor oral intake .      Assessment & Plan:    Acute metabolic encephalopathy- -possibly due to UTI, proctitis, dehydration -Multiple hospitalization, -Currently confused, but easily arousable -Vital stable afebrile, normotensive no emesis coughing greenish  -COVID,  flu and RSV negative -Hydrate and treat infection   2)Proctitis--- patient received cefepime and Vanco in the ED --CT abdomen pelvis with possible proctitis WBC is up to 14.4, lactic acid is 2.5 Treat empirically with Rocephin and Flagyl   3)UTI-- +ve Dysuria, --UA suggestive of UTI -WBC is up to 14.4,  - Lactic acid is 2.5 >> 1.8  - IV Rocephin as above pending further culture data   4)Anemia of chronic disease - -Hemoglobin 8.2 which is close to recent baseline -Hemoglobin down to 7.7, -No bleeding concerns at this time monitor closely -Anticipate follow-up drop in H&H due to hemodilution from IV fluids   5)HIV, on HAART Cont home meds.  Biktarvy -Patient sees infectious disease at [facility]    6)Failure to thrive in  adult -Bedbound, -Give Ensure nutritional supplements Liberalize diet.  On folic acid, multivitamin, thiamine   7)Bipolar 1 disorder/Schizoaffective disorder/PTSD/Depression/anxiety-- Resume home regimen-including Restoril, trazodone, Lexapro and Latuda -Xanax as ordered -History of noncompliance with medications   8)Orthostatic hypotension/autonomic dysfunction -Patient is bedbound -- Increased Midodrine to 15 mg p.o. 3 times daily AC   9)GERD -Pepcid as ordered   10)Chronic hidradenitis suppurativa involving groin creases, pubic region, buttocks --see photos in epic -Decubitus ulcers-noted they do not look infected at this time -Some of the wounds are deep and tracking   Status is: Inpatient   Remains inpatient appropriate because:    Dispo: The patient is from: Home              Anticipated d/c is to: SNF              Anticipated d/c date is: 2 days              Patient currently is not medically stable to d/c. Barriers: Not Clinically Stable-also difficult to discharge, mom unable to take care of him at home, he is now bedbound, needing assist with all ADLs    Assessment & Plan:   Principal Problem:   Acute metabolic encephalopathy Active Problems:   UTI (urinary tract  infection)   Protein-calorie malnutrition, severe (HCC)   GERD (gastroesophageal reflux disease)   HIV disease (HCC)   Anemia   Avascular necrosis of femoral head, left (HCC)   FTT (failure to thrive) in adult   Alcoholic cirrhosis of liver (HCC)   Bipolar disorder (HCC)     Assessment and Plan: No notes have been filed under this hospital service. Service: Hospitalist           ----------------------------------------------------------------------------------------------------------------------------------------------- Nutritional status:  The patient's BMI is: Body mass index is 18.46 kg/m. I agree with the assessment and plan as outlined below: Nutrition Status: Nutrition  Problem: Severe Malnutrition Etiology: chronic illness, catabolic illness (cirrhosis, HIV, numerous chronic pressure injuries) Signs/Symptoms: energy intake < or equal to 75% for > or equal to 1 month, percent weight loss Percent weight loss: 17 % (x 3 months and 39 % loss x 27 months) Interventions: Boost Plus, Liberalize Diet, Education, Prostat, Juven (vitamin c and thiamine daily)     Skin Assessment: I have examined the patient's skin and I agree with the wound assessment as performed by wound care team As outlined belowe: Pressure Injury 08/27/22 Buttocks Bilateral Stage 2 -  Partial thickness loss of dermis presenting as a shallow open injury with a red, pink wound bed without slough. (Active)  08/27/22   Location: Buttocks  Location Orientation: Bilateral  Staging: Stage 2 -  Partial thickness loss of dermis presenting as a shallow open injury with a red, pink wound bed without slough.  Wound Description (Comments):   Present on Admission: Yes     Pressure Injury 12/06/22 Buttocks Right;Distal Stage 3 -  Full thickness tissue loss. Subcutaneous fat may be visible but bone, tendon or muscle are NOT exposed. (Active)  12/06/22 1826  Location: Buttocks  Location Orientation: Right;Distal  Staging: Stage 3 -  Full thickness tissue loss. Subcutaneous fat may be visible but bone, tendon or muscle are NOT exposed.  Wound Description (Comments):   Present on Admission: Yes  Dressing Type Foam - Lift dressing to assess site every shift 12/06/22 1700     Pressure Injury 12/06/22 Thigh Right;Upper Stage 3 -  Full thickness tissue loss. Subcutaneous fat may be visible but bone, tendon or muscle are NOT exposed. (Active)  12/06/22 1700  Location: Thigh  Location Orientation: Right;Upper  Staging: Stage 3 -  Full thickness tissue loss. Subcutaneous fat may be visible but bone, tendon or muscle are NOT exposed.  Wound Description (Comments):   Present on Admission: Yes  Dressing Type  Foam - Lift dressing to assess site every shift 12/06/22 1700     Pressure Injury 12/06/22 Hip Right Stage 2 -  Partial thickness loss of dermis presenting as a shallow open injury with a red, pink wound bed without slough. (Active)  12/06/22 1700  Location: Hip  Location Orientation: Right  Staging: Stage 2 -  Partial thickness loss of dermis presenting as a shallow open injury with a red, pink wound bed without slough.  Wound Description (Comments):   Present on Admission: Yes  Dressing Type Foam - Lift dressing to assess site every shift 12/06/22 1700     Pressure Injury 12/06/22 Sacrum Right Stage 3 -  Full thickness tissue loss. Subcutaneous fat may be visible but bone, tendon or muscle are NOT exposed. (Active)  12/06/22 1700  Location: Sacrum  Location Orientation: Right  Staging: Stage 3 -  Full thickness tissue loss. Subcutaneous fat may be visible but bone, tendon or muscle are NOT  exposed.  Wound Description (Comments):   Present on Admission: Yes  Dressing Type Foam - Lift dressing to assess site every shift 12/06/22 1700     Pressure Injury 12/06/22 Thigh Left;Medial Stage 2 -  Partial thickness loss of dermis presenting as a shallow open injury with a red, pink wound bed without slough. (Active)  12/06/22 1833  Location: Thigh  Location Orientation: Left;Medial  Staging: Stage 2 -  Partial thickness loss of dermis presenting as a shallow open injury with a red, pink wound bed without slough.  Wound Description (Comments):   Present on Admission: Yes  Dressing Type Foam - Lift dressing to assess site every shift 12/06/22 1700     Pressure Injury 12/06/22 Thigh Left;Mid;Upper Stage 3 -  Full thickness tissue loss. Subcutaneous fat may be visible but bone, tendon or muscle are NOT exposed. (Active)  12/06/22 1700  Location: Thigh  Location Orientation: Left;Mid;Upper  Staging: Stage 3 -  Full thickness tissue loss. Subcutaneous fat may be visible but bone, tendon or muscle  are NOT exposed.  Wound Description (Comments):   Present on Admission: Yes  Dressing Type Foam - Lift dressing to assess site every shift 12/06/22 1700     Pressure Injury 12/06/22 Knee Left Unstageable - Full thickness tissue loss in which the base of the injury is covered by slough (yellow, tan, gray, green or brown) and/or eschar (tan, brown or black) in the wound bed. (Active)  12/06/22 1700  Location: Knee  Location Orientation: Left  Staging: Unstageable - Full thickness tissue loss in which the base of the injury is covered by slough (yellow, tan, gray, green or brown) and/or eschar (tan, brown or black) in the wound bed.  Wound Description (Comments):   Present on Admission: Yes  Dressing Type Foam - Lift dressing to assess site every shift 12/06/22 1700     Pressure Injury 12/06/22 Heel Left;Lateral Deep Tissue Pressure Injury - Purple or maroon localized area of discolored intact skin or blood-filled blister due to damage of underlying soft tissue from pressure and/or shear. (Active)  12/06/22 1700  Location: Heel  Location Orientation: Left;Lateral  Staging: Deep Tissue Pressure Injury - Purple or maroon localized area of discolored intact skin or blood-filled blister due to damage of underlying soft tissue from pressure and/or shear.  Wound Description (Comments):   Present on Admission: Yes  Dressing Type Foam - Lift dressing to assess site every shift 12/06/22 1700     Pressure Injury 12/06/22 Heel Left;Proximal Deep Tissue Pressure Injury - Purple or maroon localized area of discolored intact skin or blood-filled blister due to damage of underlying soft tissue from pressure and/or shear. (Active)  12/06/22 1700  Location: Heel  Location Orientation: Left;Proximal  Staging: Deep Tissue Pressure Injury - Purple or maroon localized area of discolored intact skin or blood-filled blister due to damage of underlying soft tissue from pressure and/or shear.  Wound Description  (Comments):   Present on Admission: Yes  Dressing Type Foam - Lift dressing to assess site every shift 12/06/22 1700     Pressure Injury 12/06/22 Foot Left;Posterior;Upper Deep Tissue Pressure Injury - Purple or maroon localized area of discolored intact skin or blood-filled blister due to damage of underlying soft tissue from pressure and/or shear. (Active)  12/06/22 1842  Location: Foot  Location Orientation: Left;Posterior;Upper  Staging: Deep Tissue Pressure Injury - Purple or maroon localized area of discolored intact skin or blood-filled blister due to damage of underlying soft tissue from pressure and/or  shear.  Wound Description (Comments):   Present on Admission: Yes  Dressing Type Foam - Lift dressing to assess site every shift 12/06/22 1700     Pressure Injury 12/06/22 Heel Right Deep Tissue Pressure Injury - Purple or maroon localized area of discolored intact skin or blood-filled blister due to damage of underlying soft tissue from pressure and/or shear. (Active)  12/06/22 1700  Location: Heel  Location Orientation: Right  Staging: Deep Tissue Pressure Injury - Purple or maroon localized area of discolored intact skin or blood-filled blister due to damage of underlying soft tissue from pressure and/or shear.  Wound Description (Comments):   Present on Admission: Yes  Dressing Type Foam - Lift dressing to assess site every shift 12/06/22 1700     Pressure Injury 12/06/22 Foot Left;Lateral Deep Tissue Pressure Injury - Purple or maroon localized area of discolored intact skin or blood-filled blister due to damage of underlying soft tissue from pressure and/or shear. (Active)  12/06/22 1700  Location: Foot  Location Orientation: Left;Lateral  Staging: Deep Tissue Pressure Injury - Purple or maroon localized area of discolored intact skin or blood-filled blister due to damage of underlying soft tissue from pressure and/or shear.  Wound Description (Comments):   Present on  Admission: Yes  Dressing Type Foam - Lift dressing to assess site every shift 12/06/22 1700     Pressure Injury 12/06/22 Foot Left;Posterior;Upper Deep Tissue Pressure Injury - Purple or maroon localized area of discolored intact skin or blood-filled blister due to damage of underlying soft tissue from pressure and/or shear. (Active)  12/06/22 1700  Location: Foot  Location Orientation: Left;Posterior;Upper  Staging: Deep Tissue Pressure Injury - Purple or maroon localized area of discolored intact skin or blood-filled blister due to damage of underlying soft tissue from pressure and/or shear.  Wound Description (Comments):   Present on Admission: Yes  Dressing Type Foam - Lift dressing to assess site every shift 12/06/22 1700     ---------------------------------------------------------------------------------------------------------------------------------------------------- Cultures; Blood Cultures x 2 >> NGT Urine Culture  >>> NGT  Sputum Culture >> NGT    ------------------------------------------------------------------------------------------------------------------------------------------------  DVT prophylaxis:  heparin injection 5,000 Units Start: 12/06/22 2200 SCDs Start: 12/06/22 1630   Code Status:   Code Status: Full Code  Family Communication: No family member present at bedside- attempt will be made to update daily The above findings and plan of care has been discussed with patient (and family)  in detail,  they expressed understanding and agreement of above. -Advance care planning has been discussed.   Admission status:   Status is: Inpatient Remains inpatient appropriate because: Eating IV fluids, IV antibiotics,   Disposition: From  - home             Planning for discharge in 1-2 days: to   Procedures:   No admission procedures for hospital encounter.   Antimicrobials:  Anti-infectives (From admission, onward)    Start     Dose/Rate Route  Frequency Ordered Stop   12/07/22 0200  vancomycin (VANCOREADY) IVPB 750 mg/150 mL        750 mg 150 mL/hr over 60 Minutes Intravenous Every 12 hours 12/06/22 1409 12/07/22 0215   12/06/22 2200  cefTRIAXone (ROCEPHIN) 1 g in sodium chloride 0.9 % 100 mL IVPB        1 g 200 mL/hr over 30 Minutes Intravenous Every 24 hours 12/06/22 1844     12/06/22 2200  metroNIDAZOLE (FLAGYL) IVPB 500 mg        500 mg 100 mL/hr  over 60 Minutes Intravenous Every 12 hours 12/06/22 1845     12/06/22 2100  ceFEPIme (MAXIPIME) 2 g in sodium chloride 0.9 % 100 mL IVPB  Status:  Discontinued        2 g 200 mL/hr over 30 Minutes Intravenous Every 8 hours 12/06/22 1314 12/06/22 1844   12/06/22 1800  bictegravir-emtricitabine-tenofovir AF (BIKTARVY) 50-200-25 MG per tablet 1 tablet        1 tablet Oral Daily 12/06/22 1633     12/06/22 1800  valACYclovir (VALTREX) tablet 1,000 mg        1,000 mg Oral Daily 12/06/22 1633     12/06/22 1300  vancomycin (VANCOREADY) IVPB 1500 mg/300 mL        1,500 mg 150 mL/hr over 120 Minutes Intravenous  Once 12/06/22 1245 12/06/22 1555   12/06/22 1230  ceFEPIme (MAXIPIME) 2 g in sodium chloride 0.9 % 100 mL IVPB        2 g 200 mL/hr over 30 Minutes Intravenous  Once 12/06/22 1229 12/06/22 1315   12/06/22 1230  metroNIDAZOLE (FLAGYL) IVPB 500 mg        500 mg 100 mL/hr over 60 Minutes Intravenous  Once 12/06/22 1229 12/06/22 1505   12/06/22 1230  vancomycin (VANCOCIN) IVPB 1000 mg/200 mL premix  Status:  Discontinued        1,000 mg 200 mL/hr over 60 Minutes Intravenous  Once 12/06/22 1229 12/06/22 1245        Medication:   (feeding supplement) PROSource Plus  30 mL Oral BID BM   acidophilus  2 capsule Oral TID   ascorbic acid  500 mg Oral Daily   bictegravir-emtricitabine-tenofovir AF  1 tablet Oral Daily   escitalopram  20 mg Oral Daily   famotidine  40 mg Oral q morning   feeding supplement  237 mL Oral BID BM   heparin  5,000 Units Subcutaneous Q8H   lurasidone   120 mg Oral Daily   midodrine  10 mg Oral TID WC   multivitamin with minerals  1 tablet Oral Daily   nutrition supplement (JUVEN)  1 packet Oral BID BM   senna-docusate  2 tablet Oral QHS   sodium chloride flush  3 mL Intravenous Q12H   sodium chloride flush  3 mL Intravenous Q12H   temazepam  30 mg Oral QHS   thiamine  100 mg Oral Daily   trazodone  300 mg Oral QHS   valACYclovir  1,000 mg Oral Daily    sodium chloride, acetaminophen **OR** acetaminophen, ALPRAZolam, bisacodyl, cyclobenzaprine, diphenoxylate-atropine, polyethylene glycol, sodium chloride flush   Objective:   Vitals:   12/06/22 1631 12/06/22 2033 12/07/22 0035 12/07/22 0535  BP: 93/65 111/74 105/72 103/70  Pulse: (!) 109 (!) 111 (!) 120 (!) 116  Resp: 18 (!) '22 14 18  '$ Temp: 99.1 F (37.3 C) 99.1 F (37.3 C) 98.2 F (36.8 C) (!) 97.4 F (36.3 C)  TempSrc: Oral Oral Oral Oral  SpO2: 100% 100% 100% 100%  Weight:      Height:        Intake/Output Summary (Last 24 hours) at 12/07/2022 1314 Last data filed at 12/07/2022 0500 Gross per 24 hour  Intake 3308.41 ml  Output 1100 ml  Net 2208.41 ml   Filed Weights   12/06/22 1010  Weight: 67 kg     Physical examination:   Constitution: Difficult to arouse, lethargic, Psychiatric:   Normal and stable mood and affect, cognition intact,   HEENT:  Normocephalic, PERRL, otherwise with in Normal limits  Chest:         Chest symmetric Cardio vascular:  S1/S2, RRR, No murmure, No Rubs or Gallops  pulmonary: Clear to auscultation bilaterally, respirations unlabored, negative wheezes / crackles Abdomen: Soft, non-tender, non-distended, bowel sounds,no masses, no organomegaly Muscular skeletal: Weaknesses Limited exam - in bed, able to move all 4 extremities,   Neuro: CNII-XII intact. , normal motor and sensation, reflexes intact  Extremities: No pitting edema lower extremities, +2 pulses  Skin: Dry, warm to touch, negative for any Rashes, audible open  wounds Wounds: per nursing documentation -full but toxic growing in lower extremity wounds See media for pictures taken today 12/07/2022  ------------------------------------------------------------------------------------------------------------------------------------------    LABs:     Latest Ref Rng & Units 12/07/2022    5:14 AM 12/06/2022   11:17 AM 12/03/2022    7:04 AM  CBC  WBC 4.0 - 10.5 K/uL 9.2  14.4  12.5   Hemoglobin 13.0 - 17.0 g/dL 7.7  8.2  8.5   Hematocrit 39.0 - 52.0 % 25.9  27.8  27.8   Platelets 150 - 400 K/uL 449  627  469       Latest Ref Rng & Units 12/07/2022    5:14 AM 12/06/2022   11:17 AM 12/03/2022    7:04 AM  CMP  Glucose 70 - 99 mg/dL 74  94  125   BUN 6 - 20 mg/dL '8  14  14   '$ Creatinine 0.61 - 1.24 mg/dL 0.85  1.16  0.98   Sodium 135 - 145 mmol/L 133  136  134   Potassium 3.5 - 5.1 mmol/L 3.0  4.1  4.0   Chloride 98 - 111 mmol/L 104  100  96   CO2 22 - 32 mmol/L '21  20  25   '$ Calcium 8.9 - 10.3 mg/dL 8.1  9.5  9.6   Total Protein 6.5 - 8.1 g/dL  9.6  9.1   Total Bilirubin 0.3 - 1.2 mg/dL  1.4  0.3   Alkaline Phos 38 - 126 U/L  90  99   AST 15 - 41 U/L  22  22   ALT 0 - 44 U/L  8  8        Micro Results Recent Results (from the past 240 hour(s))  Resp panel by RT-PCR (RSV, Flu A&B, Covid) Anterior Nasal Swab     Status: None   Collection Time: 12/06/22 10:56 AM   Specimen: Anterior Nasal Swab  Result Value Ref Range Status   SARS Coronavirus 2 by RT PCR NEGATIVE NEGATIVE Final    Comment: (NOTE) SARS-CoV-2 target nucleic acids are NOT DETECTED.  The SARS-CoV-2 RNA is generally detectable in upper respiratory specimens during the acute phase of infection. The lowest concentration of SARS-CoV-2 viral copies this assay can detect is 138 copies/mL. A negative result does not preclude SARS-Cov-2 infection and should not be used as the sole basis for treatment or other patient management decisions. A negative result may occur with  improper  specimen collection/handling, submission of specimen other than nasopharyngeal swab, presence of viral mutation(s) within the areas targeted by this assay, and inadequate number of viral copies(<138 copies/mL). A negative result must be combined with clinical observations, patient history, and epidemiological information. The expected result is Negative.  Fact Sheet for Patients:  EntrepreneurPulse.com.au  Fact Sheet for Healthcare Providers:  IncredibleEmployment.be  This test is no t yet approved or cleared by the [place] and  has been authorized for detection and/or diagnosis of SARS-CoV-2 by FDA under an Emergency Use Authorization (EUA). This EUA will remain  in effect (meaning this test can be used) for the duration of the COVID-19 declaration under Section 564(b)(1) of the Act, 21 U.S.C.section 360bbb-3(b)(1), unless the authorization is terminated  or revoked sooner.       Influenza A by PCR NEGATIVE NEGATIVE Final   Influenza B by PCR NEGATIVE NEGATIVE Final    Comment: (NOTE) The Xpert Xpress SARS-CoV-2/FLU/RSV plus assay is intended as an aid in the diagnosis of influenza from Nasopharyngeal swab specimens and should not be used as a sole basis for treatment. Nasal washings and aspirates are unacceptable for Xpert Xpress SARS-CoV-2/FLU/RSV testing.  Fact Sheet for Patients: EntrepreneurPulse.com.au  Fact Sheet for Healthcare Providers: IncredibleEmployment.be  This test is not yet approved or cleared by the [place] FDA and has been authorized for detection and/or diagnosis of SARS-CoV-2 by FDA under an Emergency Use Authorization (EUA). This EUA will remain in effect (meaning this test can be used) for the duration of the COVID-19 declaration under Section 564(b)(1) of the Act, 21 U.S.C. section 360bbb-3(b)(1), unless the authorization is terminated or revoked.     Resp  Syncytial Virus by PCR NEGATIVE NEGATIVE Final    Comment: (NOTE) Fact Sheet for Patients: EntrepreneurPulse.com.au  Fact Sheet for Healthcare Providers: IncredibleEmployment.be  This test is not yet approved or cleared by the [place] FDA and has been authorized for detection and/or diagnosis of SARS-CoV-2 by FDA under an Emergency Use Authorization (EUA). This EUA will remain in effect (meaning this test can be used) for the duration of the COVID-19 declaration under Section 564(b)(1) of the Act, 21 U.S.C. section 360bbb-3(b)(1), unless the authorization is terminated or revoked.  Performed at [facility], [address], [place], [place] [address]   Blood Culture (routine x 2)     Status: None (Preliminary result)   Collection Time: 12/06/22 11:17 AM   Specimen: BLOOD  Result Value Ref Range Status   Specimen Description BLOOD RIGHT ASSIST CONTROL  Final   Special Requests   Final    BOTTLES DRAWN AEROBIC AND ANAEROBIC Blood Culture results may not be optimal due to an excessive volume of blood received in culture bottles   Culture   Final    NO GROWTH < 24 HOURS Performed at [facility], [address] [place], [place] [address]    Report Status PENDING  Incomplete  Blood Culture (routine x 2)     Status: None (Preliminary result)   Collection Time: 12/06/22 11:17 AM   Specimen: BLOOD  Result Value Ref Range Status   Specimen Description BLOOD BLOOD RIGHT ARM  Final   Special Requests   Final    BOTTLES DRAWN AEROBIC AND ANAEROBIC Blood Culture adequate volume   Culture   Final    NO GROWTH < 24 HOURS Performed at [facility], [address] [place], [place] [address]    Report Status PENDING  Incomplete    Radiology Reports CT ABDOMEN PELVIS W CONTRAST  Result Date: 12/06/2022 CLINICAL DATA:  Sepsis, not eating for 2 days; history of PTSD, substance abuse, psychiatric disease, EXAM: CT ABDOMEN AND PELVIS WITH CONTRAST  TECHNIQUE: Multidetector CT imaging of the abdomen and pelvis was performed using the standard protocol following bolus administration of intravenous contrast. RADIATION DOSE REDUCTION: This exam was performed according to the departmental dose-optimization program which includes automated exposure control, adjustment of the mA and/or kV according  to patient size and/or use of iterative reconstruction technique. CONTRAST:  135m OMNIPAQUE IOHEXOL 300 MG/ML SOLN IV. No oral contrast. COMPARISON:  06/27/2022 FINDINGS: Lower chest: Lung bases clear Hepatobiliary: Gallbladder and liver normal appearance Pancreas: Normal appearance Spleen: Normal appearance Adrenals/Urinary Tract: Bladder decompressed with minimal dependent density which could represent tiny calculi or mural calcification. Adrenal glands, kidneys, and ureters unremarkable. No renal mass, hydronephrosis or urinary tract calcification. Stomach/Bowel: Mild rectal wall thickening. Normal appendix. Stomach and remaining bowel loops unremarkable. Vascular/Lymphatic: Premature atherosclerotic calcifications aorta and iliac arteries without aneurysm. External iliac and inguinal adenopathy bilaterally increased from previous exam. LEFT external iliac node 17 mm image 81. RIGHT external iliac node 13 mm image 80. Scattered normal sized retroperitoneal nodes. Reproductive: Unremarkable prostate gland and seminal vesicles Other: No free air or free fluid.  No hernia. Musculoskeletal: Advanced degenerative changes of the LEFT hip joint secondary to avascular necrosis with bone destruction at femoral head, joint space narrowing, articular surface irregularity, and associated joint effusion/debris. IMPRESSION: Mild rectal wall thickening question proctitis. Increased external iliac and inguinal adenopathy; this could be reactive or related to HIV but CT is unable to exclude additional etiologies such as lymphoma and metastatic disease. Question dependent calculi in  urinary bladder versus mural calcification. 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 Aortic Atherosclerosis (ICD10-I70.0). Electronically Signed   By: MLavonia DanaM.D.   On: 12/06/2022 13:51    SIGNED: SDeatra James MD, FHM. FAAFP. MZacarias Pontes- [facility] Time spent > 55 min.  In seeing, evaluating and examining the patient. Reviewing medical records, labs, drawn plan of care. [facility],  Pager (please use [email or web address] to page/ text) Please use Epic Secure Chat for non-urgent communication (7AM-7PM)  If 7PM-7AM, please contact night-coverage [email or web address], 12/07/2022, 1:14 PM

## 2022-12-07 NOTE — TOC Initial Note (Signed)
Transition of Care [facility]) - Initial/Assessment Note    Patient Details  Name: [name] MRN: [id number] Date of Birth: 05-03-1986  [facility] of [facility]) CM/SW Contact:    Boneta Lucks, RN Phone Number: 12/07/2022, 1:07 PM  Clinical Narrative:     Patient admitted with acute metabolic encephalopathy. Mother is wanting placement. She states she has been working on placement. FL2 sent out to SNF.  Also faxed to [facility]. He declined due to extensive wound care needed. TOC following.           Expected Discharge Plan: Skilled Nursing Facility Barriers to Discharge: Continued Medical Work up   Patient Goals and CMS Choice Patient states their goals for this hospitalization and ongoing recovery are:: needs placement     Expected Discharge Plan and Services       Prior Living Arrangements/Services      Emotional Assessment   Admission diagnosis:  Proctitis [K62.89] UTI (urinary tract infection) [N39.0] Wound of gluteal cleft, unspecified laterality, subsequent encounter [S31.809D] Sepsis without acute organ dysfunction, due to unspecified organism [facility]) [A41.9] Patient Active Problem List   Diagnosis Date Noted   UTI (urinary tract infection) 12/06/2022   Pressure injury of skin 08/28/2022   AMS (altered mental status) 08/26/2022   AKI (acute kidney injury) ([place]) 06/24/2022   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   Noncompliance with medications 02/20/2022   Hyperphosphatemia [id number]   Alcoholic cirrhosis of liver ([place]) 02/17/2022   Chronic alcohol use 02/17/2022   Bipolar disorder ([place]) 02/17/2022   Sepsis ([place]) 02/13/2022   Malnutrition of moderate degree 10/20/2021   Palliative care by specialist    DNR (do not resuscitate) 10/14/2021   Aspiration pneumonia ([place]) 10/14/2021   FTT (failure to thrive) in adult 10/14/2021   Dysphagia 10/14/2021   Coagulopathy ([place]) 10/14/2021   Protein-calorie malnutrition, severe ([place]) 10/09/2021    Class: Chronic    Avascular necrosis of femoral head, left ([place]) [id number]   Alcoholic cirrhosis of liver without ascites ([place]) 10/07/2021   Cellulitis of multiple sites of buttock 10/07/2021   Hypotension    Hypoxia 08/24/2021   Left hip pain    Transaminitis    Acute metabolic encephalopathy [id number]   Cellulitis 05/07/2021   Lymphadenopathy 09/29/2020   Anemia    Upper urinary tract infection    Sepsis secondary to UTI ([place]) 03/14/2020   Cellulitis of groin 03/14/2020   HIV disease ([place])    Macrocytic anemia    Thrombocytopenia (H[facility])    Prolonged QT interval    Acute respiratory failure due to COVID-19 ([place]) 10/27/2019   GERD (gastroesophageal reflux disease) 10/27/2019   Hypokalemia 10/27/2019   Peripheral neuropathy 10/01/2019   PTSD (post-traumatic stress disorder) 07/23/2018   Dizziness and giddiness 02/01/2016   Migraine headache 02/01/2016   Schizoaffective disorder, depressive type ([place]) 05/13/2015   Severe alcohol dependence ([place]) 05/13/2015   Suicidal ideation 01/12/2014   PCP:  [name], MD Pharmacy:   [place], [place] - [address] [place] [address] Phone: [phone] Fax: [phone]   Social Determinants of Health (SDOH) Social History: SDOH Screenings   Alcohol Screen: Medium Risk (07/22/2018)  Tobacco Use: High Risk (12/06/2022)   SDOH Interventions:    Readmission Risk Interventions    02/15/2022    2:35 PM 08/24/2021    4:00 PM 08/24/2021   11:54 AM  Readmission Risk Prevention Plan  Transportation Screening Complete Complete   PCP or Specialist Appt within 3-5 Days  Complete Complete  HRI or Home Care Consult  Complete Complete  Social Work Consult for [place] Planning/Counseling   Complete  Palliative Care Screening  Complete Complete  Medication Review Press photographer) Complete Complete Complete  PCP or Specialist appointment within 3-5 days of discharge Complete    HRI or [place] Complete    SW Recovery Care/Counseling Consult Complete    [place] Not Applicable

## 2022-12-07 NOTE — Consult Note (Signed)
[place] Nurse Consult Note: Patient presents to ED after losing his nursing aide services and unable to care for self.  Known to [place] team.  Has hidradenitis suppurativa and deep tissue and unstageable pressure injuries present as well.  Was previously in ED with mother requesting placement in SNF and patient refusing and HH ordered.  On both admissions to ED, patient reports food insecurity.  Reason for Consult:Lesions to perineal areas, bilateral hips and feet and left knee. Pressure injuries to bilateral hips.  Wound type:inflammatory to perineal skin Pressure to knee, hips and feet.  Pressure Injury POA: Yes Stage 3 bilateral hips, unstageable to left knee.  Measurement:nursing to obtain and place in flow sheets, some lesions to perineal area with depth and tunneling Wound bed: Left knee is 50% necrotic tissue Bilateral feet and heels with intact deep tissue to Left heel, Right lateral foot x 2 )  Callous to left medial foot at great toe metatarsal head.  Drainage (amount, consistency, odor) moderate purulence to perineal wounds with musty odor Periwound:  Erythema to perineal skin, frequently moist Dressing procedure/placement/frequency: Cleanse wounds to perineal and buttocks area, bilateral hips, left knee with soap and water and pat dry. Apply aquacel Ag to open areas ([place] # [id number]) and cover with gauze and ABD pad or kerlix and tape.  Change Monday/WEd/Friday and PRN soilage.  Offload pressure to bilateral heels with prevalon boots  Needs mattress with low air loss feature.  Will not follow at this time.  Please re-consult if needed.  Drew Ludwig MSN, RN, FNP-BC CWON Wound, Ostomy, Continence Nurse [facility] [phone] Pager [phone]

## 2022-12-08 DIAGNOSIS — G9341 Metabolic encephalopathy: Secondary | ICD-10-CM | POA: Diagnosis not present

## 2022-12-08 LAB — CBC
HCT: 23.8 % — ABNORMAL LOW (ref 39.0–52.0)
Hemoglobin: 7 g/dL — ABNORMAL LOW (ref 13.0–17.0)
MCH: 28.1 pg (ref 26.0–34.0)
MCHC: 29.4 g/dL — ABNORMAL LOW (ref 30.0–36.0)
MCV: 95.6 fL (ref 80.0–100.0)
Platelets: 403 10*3/uL — ABNORMAL HIGH (ref 150–400)
RBC: 2.49 MIL/uL — ABNORMAL LOW (ref 4.22–5.81)
RDW: 14.3 % (ref 11.5–15.5)
WBC: 9.7 10*3/uL (ref 4.0–10.5)
nRBC: 0 % (ref 0.0–0.2)

## 2022-12-08 LAB — BASIC METABOLIC PANEL
Anion gap: 6 (ref 5–15)
BUN: 8 mg/dL (ref 6–20)
CO2: 24 mmol/L (ref 22–32)
Calcium: 7.7 mg/dL — ABNORMAL LOW (ref 8.9–10.3)
Chloride: 105 mmol/L (ref 98–111)
Creatinine, Ser: 0.88 mg/dL (ref 0.61–1.24)
GFR, Estimated: >60 mL/min (ref >60)
Glucose, Bld: 115 mg/dL — ABNORMAL HIGH (ref 70–99)
Potassium: 2.8 mmol/L — ABNORMAL LOW (ref 3.5–5.1)
Sodium: 135 mmol/L (ref 135–145)

## 2022-12-08 LAB — URINE CULTURE

## 2022-12-08 MED ORDER — FLORANEX PO PACK
1.0000 g | PACK | Freq: Three times a day (TID) | ORAL | Status: DC
  Administered 2022-12-08 – 2022-12-15 (×21): 1 g via ORAL
  Filled 2022-12-08 (×21): qty 1

## 2022-12-08 MED ORDER — METRONIDAZOLE 500 MG PO TABS
500.0000 mg | ORAL_TABLET | Freq: Three times a day (TID) | ORAL | Status: DC
  Administered 2022-12-08 – 2022-12-12 (×11): 500 mg via ORAL
  Filled 2022-12-08 (×11): qty 1

## 2022-12-08 MED ORDER — TRAZODONE HCL 50 MG PO TABS
200.0000 mg | ORAL_TABLET | Freq: Every day | ORAL | Status: DC
  Administered 2022-12-08 – 2022-12-11 (×4): 200 mg via ORAL
  Filled 2022-12-08 (×4): qty 4

## 2022-12-08 MED ORDER — HEPARIN SODIUM (PORCINE) 5000 UNIT/ML IJ SOLN
5000.0000 [IU] | Freq: Three times a day (TID) | INTRAMUSCULAR | Status: DC
  Administered 2022-12-09: 5000 [IU] via SUBCUTANEOUS
  Filled 2022-12-08: qty 1

## 2022-12-08 NOTE — Progress Notes (Signed)
Patient had 1 episode of n/v during this shift. Patient stated that he believes the chocolate drink upset his stomach. Patient c/o generalized pain at the beginning of the shift. PRN tylenol given as ordered. PRN xanax given for anxiety during this shift. Patient up most of the night asking for snacks / breakfast.

## 2022-12-08 NOTE — Consult Note (Signed)
WOC consult requested for multiple wounds.  This was already performed yesterday by Estrellita Ludwig; please refer to previous progress notes for assessment, and topical treatment orders have been provided for bedside nurses to perform.  Please re-consult if further assistance is needed.  Thank-you,  Julien Girt MSN, [place], [place], [place], [place]

## 2022-12-08 NOTE — Progress Notes (Signed)
PROGRESS NOTE    Patient: [name]                            PCP: [name], MD                    DOB: [birth year]            DOA: 12/06/2022 [id number]             DOS: 12/08/2022, 12:08 PM   LOS: 2 days   Date of Service: The patient was seen and examined on 12/08/2022  Subjective:   The patient was seen and examined this morning, much more awake, following commands, stable Noted for drop in H&H from 7.7--->7.0 mildly hypertensive, BP 98/71 Otherwise stable  Brief Narrative:   [name] is  a 37 year old male apparently bedbound from home with significant history of HIV,  , HTN, FTT, Chronic hidradenitis suppurativa involving groin creases, pubic region, buttocks , Anemia of chronic disease, alcoholic liver cirrhosis, GERD and history of orthostatic hypotension in the setting of autonomic dysfunction on chronic decubitus ulcers in the setting of hidradenitis suppurativa presents from home by EMS ??  Altered mentation in the setting of poor oral intake .   Assessment & Plan:   Principal Problem:   Acute metabolic encephalopathy Active Problems:   UTI (urinary tract infection)   Protein-calorie malnutrition, severe (HCC)   GERD (gastroesophageal reflux disease)   HIV disease (HCC)   Anemia   Avascular necrosis of femoral head, left (HCC)   FTT (failure to thrive) in adult   Alcoholic cirrhosis of liver (HCC)   Bipolar disorder (HCC)   Acute metabolic encephalopathy- -mentation is improved, awake alert, following command this morning -Altered mental status likely related to: UTI, proctitis, dehydration -Multiple hospitalization,  -Vital stable  -COVID,  flu and RSV negative -Hydrate and treat infection   2)Proctitis--- patient received cefepime and Vanco in the ED --CT abdomen pelvis with possible proctitis - WBC is up to 14.4 >>>9.7  -  Lactic acid is 2.5 Treat empirically with Rocephin and Flagyl   3)UTI-- +ve Dysuria, --UA suggestive of  UTI -WBC is up to 14.4,  - Lactic acid is 2.5 >> 1.8  - IV Rocephin as above pending further culture data   4)Anemia of chronic disease - -Hemoglobin 8.2 which is close to recent baseline -Hemoglobin down to 7.7 >>> 7.0 hem-onc -No bleeding concerns at this time monitor closely -Anticipate follow-up drop in H&H due to hemodilution from IV fluids   5)HIV, on HAART Cont home meds.  Biktarvy -Patient sees infectious disease at [facility]    6)Failure to thrive in adult -Bedbound, -Give Ensure nutritional supplements Liberalize diet.  On folic acid, multivitamin, thiamine   7)Bipolar 1 disorder/Schizoaffective disorder/PTSD/Depression/anxiety-- Resume home regimen-including Restoril, trazodone, Lexapro and Latuda -Xanax as ordered -History of noncompliance with medications   8)Orthostatic hypotension/autonomic dysfunction -Patient is bedbound -- Increased Midodrine to 15 mg p.o. 3 times daily AC   9)GERD -Pepcid as ordered   10)Chronic hidradenitis suppurativa involving groin creases, pubic region, buttocks --see photos in epic -Decubitus ulcers-noted they do not look infected at this time -Some of the wounds are deep and tracking   Status is: Inpatient   Remains inpatient appropriate because:    Dispo: The patient is from: Home              Anticipated d/c is to: SNF  Anticipated d/c date is: 2 days              Patient currently is not medically stable to d/c. Barriers: Not Clinically Stable-also difficult to discharge, mom unable to take care of him at home, he is now bedbound, needing assist with all ADLs   ------------------------------------------------------------------------------------------------------------------------- Nutritional status:  The patient's BMI is: Body mass index is 18.46 kg/m. I agree with the assessment and plan as outlined below: Nutrition Status: Nutrition Problem: Severe Malnutrition Etiology: chronic illness, catabolic illness  (cirrhosis, HIV, numerous chronic pressure injuries) Signs/Symptoms: energy intake < or equal to 75% for > or equal to 1 month, percent weight loss Percent weight loss: 17 % (x 3 months and 39 % loss x 27 months) Interventions: Boost Plus, Liberalize Diet, Education, Prostat, Juven (vitamin c and thiamine daily)     Skin Assessment: I have examined the patient's skin and I agree with the wound assessment as performed by wound care team As outlined belowe: Pressure Injury 08/27/22 Buttocks Bilateral Stage 2 -  Partial thickness loss of dermis presenting as a shallow open injury with a red, pink wound bed without slough. (Active)  08/27/22   Location: Buttocks  Location Orientation: Bilateral  Staging: Stage 2 -  Partial thickness loss of dermis presenting as a shallow open injury with a red, pink wound bed without slough.  Wound Description (Comments):   Present on Admission: Yes     Pressure Injury 12/06/22 Buttocks Right;Distal Stage 3 -  Full thickness tissue loss. Subcutaneous fat may be visible but bone, tendon or muscle are NOT exposed. (Active)  12/06/22 1826  Location: Buttocks  Location Orientation: Right;Distal  Staging: Stage 3 -  Full thickness tissue loss. Subcutaneous fat may be visible but bone, tendon or muscle are NOT exposed.  Wound Description (Comments):   Present on Admission: Yes  Dressing Type Foam - Lift dressing to assess site every shift 12/08/22 0920     Pressure Injury 12/06/22 Thigh Right;Upper Stage 3 -  Full thickness tissue loss. Subcutaneous fat may be visible but bone, tendon or muscle are NOT exposed. (Active)  12/06/22 1700  Location: Thigh  Location Orientation: Right;Upper  Staging: Stage 3 -  Full thickness tissue loss. Subcutaneous fat may be visible but bone, tendon or muscle are NOT exposed.  Wound Description (Comments):   Present on Admission: Yes  Dressing Type Foam - Lift dressing to assess site every shift 12/08/22 0920     Pressure  Injury 12/06/22 Hip Right Stage 2 -  Partial thickness loss of dermis presenting as a shallow open injury with a red, pink wound bed without slough. (Active)  12/06/22 1700  Location: Hip  Location Orientation: Right  Staging: Stage 2 -  Partial thickness loss of dermis presenting as a shallow open injury with a red, pink wound bed without slough.  Wound Description (Comments):   Present on Admission: Yes  Dressing Type Foam - Lift dressing to assess site every shift 12/08/22 0920     Pressure Injury 12/06/22 Sacrum Right Stage 3 -  Full thickness tissue loss. Subcutaneous fat may be visible but bone, tendon or muscle are NOT exposed. (Active)  12/06/22 1700  Location: Sacrum  Location Orientation: Right  Staging: Stage 3 -  Full thickness tissue loss. Subcutaneous fat may be visible but bone, tendon or muscle are NOT exposed.  Wound Description (Comments):   Present on Admission: Yes  Dressing Type Foam - Lift dressing to assess site every shift 12/08/22  0920     Pressure Injury 12/06/22 Thigh Left;Medial Stage 2 -  Partial thickness loss of dermis presenting as a shallow open injury with a red, pink wound bed without slough. (Active)  12/06/22 1833  Location: Thigh  Location Orientation: Left;Medial  Staging: Stage 2 -  Partial thickness loss of dermis presenting as a shallow open injury with a red, pink wound bed without slough.  Wound Description (Comments):   Present on Admission: Yes  Dressing Type Foam - Lift dressing to assess site every shift 12/08/22 0920     Pressure Injury 12/06/22 Thigh Left;Mid;Upper Stage 3 -  Full thickness tissue loss. Subcutaneous fat may be visible but bone, tendon or muscle are NOT exposed. (Active)  12/06/22 1700  Location: Thigh  Location Orientation: Left;Mid;Upper  Staging: Stage 3 -  Full thickness tissue loss. Subcutaneous fat may be visible but bone, tendon or muscle are NOT exposed.  Wound Description (Comments):   Present on Admission:  Yes  Dressing Type Foam - Lift dressing to assess site every shift 12/08/22 0920     Pressure Injury 12/06/22 Knee Left Unstageable - Full thickness tissue loss in which the base of the injury is covered by slough (yellow, tan, gray, green or brown) and/or eschar (tan, brown or black) in the wound bed. (Active)  12/06/22 1700  Location: Knee  Location Orientation: Left  Staging: Unstageable - Full thickness tissue loss in which the base of the injury is covered by slough (yellow, tan, gray, green or brown) and/or eschar (tan, brown or black) in the wound bed.  Wound Description (Comments):   Present on Admission: Yes  Dressing Type Foam - Lift dressing to assess site every shift 12/08/22 0920     Pressure Injury 12/06/22 Heel Left;Lateral Deep Tissue Pressure Injury - Purple or maroon localized area of discolored intact skin or blood-filled blister due to damage of underlying soft tissue from pressure and/or shear. (Active)  12/06/22 1700  Location: Heel  Location Orientation: Left;Lateral  Staging: Deep Tissue Pressure Injury - Purple or maroon localized area of discolored intact skin or blood-filled blister due to damage of underlying soft tissue from pressure and/or shear.  Wound Description (Comments):   Present on Admission: Yes  Dressing Type Foam - Lift dressing to assess site every shift 12/08/22 0920     Pressure Injury 12/06/22 Heel Left;Proximal Deep Tissue Pressure Injury - Purple or maroon localized area of discolored intact skin or blood-filled blister due to damage of underlying soft tissue from pressure and/or shear. (Active)  12/06/22 1700  Location: Heel  Location Orientation: Left;Proximal  Staging: Deep Tissue Pressure Injury - Purple or maroon localized area of discolored intact skin or blood-filled blister due to damage of underlying soft tissue from pressure and/or shear.  Wound Description (Comments):   Present on Admission: Yes  Dressing Type Foam - Lift dressing  to assess site every shift 12/08/22 0920     Pressure Injury 12/06/22 Foot Left;Posterior;Upper Deep Tissue Pressure Injury - Purple or maroon localized area of discolored intact skin or blood-filled blister due to damage of underlying soft tissue from pressure and/or shear. (Active)  12/06/22 1842  Location: Foot  Location Orientation: Left;Posterior;Upper  Staging: Deep Tissue Pressure Injury - Purple or maroon localized area of discolored intact skin or blood-filled blister due to damage of underlying soft tissue from pressure and/or shear.  Wound Description (Comments):   Present on Admission: Yes  Dressing Type Foam - Lift dressing to assess site every shift 12/08/22  0920     Pressure Injury 12/06/22 Heel Right Deep Tissue Pressure Injury - Purple or maroon localized area of discolored intact skin or blood-filled blister due to damage of underlying soft tissue from pressure and/or shear. (Active)  12/06/22 1700  Location: Heel  Location Orientation: Right  Staging: Deep Tissue Pressure Injury - Purple or maroon localized area of discolored intact skin or blood-filled blister due to damage of underlying soft tissue from pressure and/or shear.  Wound Description (Comments):   Present on Admission: Yes  Dressing Type Foam - Lift dressing to assess site every shift 12/08/22 0920     Pressure Injury 12/06/22 Foot Left;Lateral Deep Tissue Pressure Injury - Purple or maroon localized area of discolored intact skin or blood-filled blister due to damage of underlying soft tissue from pressure and/or shear. (Active)  12/06/22 1700  Location: Foot  Location Orientation: Left;Lateral  Staging: Deep Tissue Pressure Injury - Purple or maroon localized area of discolored intact skin or blood-filled blister due to damage of underlying soft tissue from pressure and/or shear.  Wound Description (Comments):   Present on Admission: Yes  Dressing Type Foam - Lift dressing to assess site every shift  12/08/22 0920     Pressure Injury 12/06/22 Foot Left;Posterior;Upper Deep Tissue Pressure Injury - Purple or maroon localized area of discolored intact skin or blood-filled blister due to damage of underlying soft tissue from pressure and/or shear. (Active)  12/06/22 1700  Location: Foot  Location Orientation: Left;Posterior;Upper  Staging: Deep Tissue Pressure Injury - Purple or maroon localized area of discolored intact skin or blood-filled blister due to damage of underlying soft tissue from pressure and/or shear.  Wound Description (Comments):   Present on Admission: Yes  Dressing Type Foam - Lift dressing to assess site every shift 12/08/22 0920     ----------------------------------------------------------------------------------------------------------------------- Cultures; Blood Cultures x 2 >> NGT -------------------------------------------------------------------------------------------------------------------------  DVT prophylaxis:  heparin injection 5,000 Units Start: 12/06/22 2200 SCDs Start: 12/06/22 1630   Code Status:   Code Status: Full Code  Family Communication: Attempted to call his mother Mrs. [place] care planning has been discussed.   Admission status:   Status is: Inpatient Remains inpatient appropriate because: Eating IV fluids, IV antibiotics,   Disposition: From  - home             Planning for discharge in 1-2 days: to SNF   Procedures:   No admission procedures for hospital encounter.   Antimicrobials:  Anti-infectives (From admission, onward)    Start     Dose/Rate Route Frequency Ordered Stop   12/07/22 0200  vancomycin (VANCOREADY) IVPB 750 mg/150 mL        750 mg 150 mL/hr over 60 Minutes Intravenous Every 12 hours 12/06/22 1409 12/07/22 0215   12/06/22 2200  cefTRIAXone (ROCEPHIN) 1 g in sodium chloride 0.9 % 100 mL IVPB        1 g 200 mL/hr over 30 Minutes Intravenous Every 24 hours 12/06/22 1844     12/06/22  2200  metroNIDAZOLE (FLAGYL) IVPB 500 mg        500 mg 100 mL/hr over 60 Minutes Intravenous Every 12 hours 12/06/22 1845     12/06/22 2100  ceFEPIme (MAXIPIME) 2 g in sodium chloride 0.9 % 100 mL IVPB  Status:  Discontinued        2 g 200 mL/hr over 30 Minutes Intravenous Every 8 hours 12/06/22 1314 12/06/22 1844   12/06/22 1800  bictegravir-emtricitabine-tenofovir AF (BIKTARVY) 50-200-25 MG per  tablet 1 tablet        1 tablet Oral Daily 12/06/22 1633     12/06/22 1800  valACYclovir (VALTREX) tablet 1,000 mg        1,000 mg Oral Daily 12/06/22 1633     12/06/22 1300  vancomycin (VANCOREADY) IVPB 1500 mg/300 mL        1,500 mg 150 mL/hr over 120 Minutes Intravenous  Once 12/06/22 1245 12/06/22 1555   12/06/22 1230  ceFEPIme (MAXIPIME) 2 g in sodium chloride 0.9 % 100 mL IVPB        2 g 200 mL/hr over 30 Minutes Intravenous  Once 12/06/22 1229 12/06/22 1315   12/06/22 1230  metroNIDAZOLE (FLAGYL) IVPB 500 mg        500 mg 100 mL/hr over 60 Minutes Intravenous  Once 12/06/22 1229 12/06/22 1505   12/06/22 1230  vancomycin (VANCOCIN) IVPB 1000 mg/200 mL premix  Status:  Discontinued        1,000 mg 200 mL/hr over 60 Minutes Intravenous  Once 12/06/22 1229 12/06/22 1245        Medication:   (feeding supplement) PROSource Plus  30 mL Oral BID BM   acidophilus  2 capsule Oral TID   ascorbic acid  500 mg Oral Daily   bictegravir-emtricitabine-tenofovir AF  1 tablet Oral Daily   escitalopram  20 mg Oral Daily   famotidine  40 mg Oral q morning   feeding supplement  237 mL Oral BID BM   heparin  5,000 Units Subcutaneous Q8H   lurasidone  120 mg Oral Daily   midodrine  10 mg Oral TID WC   multivitamin with minerals  1 tablet Oral Daily   nutrition supplement (JUVEN)  1 packet Oral BID BM   senna-docusate  2 tablet Oral QHS   sodium chloride flush  3 mL Intravenous Q12H   sodium chloride flush  3 mL Intravenous Q12H   temazepam  30 mg Oral QHS   thiamine  100 mg Oral Daily    trazodone  300 mg Oral QHS   valACYclovir  1,000 mg Oral Daily    sodium chloride, acetaminophen **OR** acetaminophen, ALPRAZolam, bisacodyl, cyclobenzaprine, diphenhydrAMINE, diphenoxylate-atropine, polyethylene glycol, sodium chloride flush   Objective:   Vitals:   12/07/22 2117 12/07/22 2340 12/08/22 0440 12/08/22 0854  BP: 115/78 110/60 107/64 98/71  Pulse: (!) 108 (!) 110 (!) 112 99  Resp:  '18 18 18  '$ Temp:  98 F (36.7 C) 97.6 F (36.4 C) 98.8 F (37.1 C)  TempSrc:  Axillary Oral Oral  SpO2:  99% 100% 100%  Weight:      Height:        Intake/Output Summary (Last 24 hours) at 12/08/2022 1208 Last data filed at 12/08/2022 1047 Gross per 24 hour  Intake 1260 ml  Output 1400 ml  Net -140 ml   Filed Weights   12/06/22 1010  Weight: 67 kg     Physical examination:        General:  AAO x 2,  cooperative, no distress;   HEENT:  Normocephalic, PERRL, otherwise with in Normal limits   Neuro:  CNII-XII intact. , normal motor and sensation, reflexes intact   Lungs:   Clear to auscultation BL, Respirations unlabored,  No wheezes / crackles  Cardio:    S1/S2, RRR, No murmure, No Rubs or Gallops   Abdomen:  Soft, non-tender, bowel sounds active all four quadrants, no guarding or peritoneal signs.  Muscular  skeletal:  Limited exam -severe  global generalized weaknesses - in bed, able to move all 4 extremities,   2+ pulses,  symmetric, No pitting edema  Skin:  Dry, warm to touch, pictures in media regarding wounds in the groin and lower extremity area  Wounds: Please see nursing documentation -see pics and description below  Pressure Injury 08/27/22 Buttocks Bilateral Stage 2 -  Partial thickness loss of dermis presenting as a shallow open injury with a red, pink wound bed without slough. (Active)  08/27/22   Location: Buttocks  Location Orientation: Bilateral  Staging: Stage 2 -  Partial thickness loss of dermis presenting as a shallow open injury with a red, pink  wound bed without slough.  Wound Description (Comments):   Present on Admission: Yes     Pressure Injury 12/06/22 Buttocks Right;Distal Stage 3 -  Full thickness tissue loss. Subcutaneous fat may be visible but bone, tendon or muscle are NOT exposed. (Active)  12/06/22 1826  Location: Buttocks  Location Orientation: Right;Distal  Staging: Stage 3 -  Full thickness tissue loss. Subcutaneous fat may be visible but bone, tendon or muscle are NOT exposed.  Wound Description (Comments):   Present on Admission: Yes  Dressing Type Foam - Lift dressing to assess site every shift 12/08/22 0920     Pressure Injury 12/06/22 Thigh Right;Upper Stage 3 -  Full thickness tissue loss. Subcutaneous fat may be visible but bone, tendon or muscle are NOT exposed. (Active)  12/06/22 1700  Location: Thigh  Location Orientation: Right;Upper  Staging: Stage 3 -  Full thickness tissue loss. Subcutaneous fat may be visible but bone, tendon or muscle are NOT exposed.  Wound Description (Comments):   Present on Admission: Yes  Dressing Type Foam - Lift dressing to assess site every shift 12/08/22 0920     Pressure Injury 12/06/22 Hip Right Stage 2 -  Partial thickness loss of dermis presenting as a shallow open injury with a red, pink wound bed without slough. (Active)  12/06/22 1700  Location: Hip  Location Orientation: Right  Staging: Stage 2 -  Partial thickness loss of dermis presenting as a shallow open injury with a red, pink wound bed without slough.  Wound Description (Comments):   Present on Admission: Yes  Dressing Type Foam - Lift dressing to assess site every shift 12/08/22 0920     Pressure Injury 12/06/22 Sacrum Right Stage 3 -  Full thickness tissue loss. Subcutaneous fat may be visible but bone, tendon or muscle are NOT exposed. (Active)  12/06/22 1700  Location: Sacrum  Location Orientation: Right  Staging: Stage 3 -  Full thickness tissue loss. Subcutaneous fat may be visible but bone,  tendon or muscle are NOT exposed.  Wound Description (Comments):   Present on Admission: Yes  Dressing Type Foam - Lift dressing to assess site every shift 12/08/22 0920     Pressure Injury 12/06/22 Thigh Left;Medial Stage 2 -  Partial thickness loss of dermis presenting as a shallow open injury with a red, pink wound bed without slough. (Active)  12/06/22 1833  Location: Thigh  Location Orientation: Left;Medial  Staging: Stage 2 -  Partial thickness loss of dermis presenting as a shallow open injury with a red, pink wound bed without slough.  Wound Description (Comments):   Present on Admission: Yes  Dressing Type Foam - Lift dressing to assess site every shift 12/08/22 0920     Pressure Injury 12/06/22 Thigh Left;Mid;Upper Stage 3 -  Full thickness tissue loss. Subcutaneous fat may be visible but bone,  tendon or muscle are NOT exposed. (Active)  12/06/22 1700  Location: Thigh  Location Orientation: Left;Mid;Upper  Staging: Stage 3 -  Full thickness tissue loss. Subcutaneous fat may be visible but bone, tendon or muscle are NOT exposed.  Wound Description (Comments):   Present on Admission: Yes  Dressing Type Foam - Lift dressing to assess site every shift 12/08/22 0920     Pressure Injury 12/06/22 Knee Left Unstageable - Full thickness tissue loss in which the base of the injury is covered by slough (yellow, tan, gray, green or brown) and/or eschar (tan, brown or black) in the wound bed. (Active)  12/06/22 1700  Location: Knee  Location Orientation: Left  Staging: Unstageable - Full thickness tissue loss in which the base of the injury is covered by slough (yellow, tan, gray, green or brown) and/or eschar (tan, brown or black) in the wound bed.  Wound Description (Comments):   Present on Admission: Yes  Dressing Type Foam - Lift dressing to assess site every shift 12/08/22 0920     Pressure Injury 12/06/22 Heel Left;Lateral Deep Tissue Pressure Injury - Purple or maroon localized  area of discolored intact skin or blood-filled blister due to damage of underlying soft tissue from pressure and/or shear. (Active)  12/06/22 1700  Location: Heel  Location Orientation: Left;Lateral  Staging: Deep Tissue Pressure Injury - Purple or maroon localized area of discolored intact skin or blood-filled blister due to damage of underlying soft tissue from pressure and/or shear.  Wound Description (Comments):   Present on Admission: Yes  Dressing Type Foam - Lift dressing to assess site every shift 12/08/22 0920     Pressure Injury 12/06/22 Heel Left;Proximal Deep Tissue Pressure Injury - Purple or maroon localized area of discolored intact skin or blood-filled blister due to damage of underlying soft tissue from pressure and/or shear. (Active)  12/06/22 1700  Location: Heel  Location Orientation: Left;Proximal  Staging: Deep Tissue Pressure Injury - Purple or maroon localized area of discolored intact skin or blood-filled blister due to damage of underlying soft tissue from pressure and/or shear.  Wound Description (Comments):   Present on Admission: Yes  Dressing Type Foam - Lift dressing to assess site every shift 12/08/22 0920     Pressure Injury 12/06/22 Foot Left;Posterior;Upper Deep Tissue Pressure Injury - Purple or maroon localized area of discolored intact skin or blood-filled blister due to damage of underlying soft tissue from pressure and/or shear. (Active)  12/06/22 1842  Location: Foot  Location Orientation: Left;Posterior;Upper  Staging: Deep Tissue Pressure Injury - Purple or maroon localized area of discolored intact skin or blood-filled blister due to damage of underlying soft tissue from pressure and/or shear.  Wound Description (Comments):   Present on Admission: Yes  Dressing Type Foam - Lift dressing to assess site every shift 12/08/22 0920     Pressure Injury 12/06/22 Heel Right Deep Tissue Pressure Injury - Purple or maroon localized area of discolored  intact skin or blood-filled blister due to damage of underlying soft tissue from pressure and/or shear. (Active)  12/06/22 1700  Location: Heel  Location Orientation: Right  Staging: Deep Tissue Pressure Injury - Purple or maroon localized area of discolored intact skin or blood-filled blister due to damage of underlying soft tissue from pressure and/or shear.  Wound Description (Comments):   Present on Admission: Yes  Dressing Type Foam - Lift dressing to assess site every shift 12/08/22 0920     Pressure Injury 12/06/22 Foot Left;Lateral Deep Tissue Pressure Injury -  Purple or maroon localized area of discolored intact skin or blood-filled blister due to damage of underlying soft tissue from pressure and/or shear. (Active)  12/06/22 1700  Location: Foot  Location Orientation: Left;Lateral  Staging: Deep Tissue Pressure Injury - Purple or maroon localized area of discolored intact skin or blood-filled blister due to damage of underlying soft tissue from pressure and/or shear.  Wound Description (Comments):   Present on Admission: Yes  Dressing Type Foam - Lift dressing to assess site every shift 12/08/22 0920     Pressure Injury 12/06/22 Foot Left;Posterior;Upper Deep Tissue Pressure Injury - Purple or maroon localized area of discolored intact skin or blood-filled blister due to damage of underlying soft tissue from pressure and/or shear. (Active)  12/06/22 1700  Location: Foot  Location Orientation: Left;Posterior;Upper  Staging: Deep Tissue Pressure Injury - Purple or maroon localized area of discolored intact skin or blood-filled blister due to damage of underlying soft tissue from pressure and/or shear.  Wound Description (Comments):   Present on Admission: Yes  Dressing Type Foam - Lift dressing to assess site every shift 12/08/22 0920           ------------------------------------------------------------------------------------------------------------------------------------------    LABs:     Latest Ref Rng & Units 12/08/2022    4:51 AM 12/07/2022    5:14 AM 12/06/2022   11:17 AM  CBC  WBC 4.0 - 10.5 K/uL 9.7  9.2  14.4   Hemoglobin 13.0 - 17.0 g/dL 7.0  7.7  8.2   Hematocrit 39.0 - 52.0 % 23.8  25.9  27.8   Platelets 150 - 400 K/uL 403  449  627       Latest Ref Rng & Units 12/08/2022    4:51 AM 12/07/2022    5:14 AM 12/06/2022   11:17 AM  CMP  Glucose 70 - 99 mg/dL 115  74  94   BUN 6 - 20 mg/dL '8  8  14   '$ Creatinine 0.61 - 1.24 mg/dL 0.88  0.85  1.16   Sodium 135 - 145 mmol/L 135  133  136   Potassium 3.5 - 5.1 mmol/L 2.8  3.0  4.1   Chloride 98 - 111 mmol/L 105  104  100   CO2 22 - 32 mmol/L '24  21  20   '$ Calcium 8.9 - 10.3 mg/dL 7.7  8.1  9.5   Total Protein 6.5 - 8.1 g/dL   9.6   Total Bilirubin 0.3 - 1.2 mg/dL   1.4   Alkaline Phos 38 - 126 U/L   90   AST 15 - 41 U/L   22   ALT 0 - 44 U/L   8        Micro Results Recent Results (from the past 240 hour(s))  Resp panel by RT-PCR (RSV, Flu A&B, Covid) Anterior Nasal Swab     Status: None   Collection Time: 12/06/22 10:56 AM   Specimen: Anterior Nasal Swab  Result Value Ref Range Status   SARS Coronavirus 2 by RT PCR NEGATIVE NEGATIVE Final    Comment: (NOTE) SARS-CoV-2 target nucleic acids are NOT DETECTED.  The SARS-CoV-2 RNA is generally detectable in upper respiratory specimens during the acute phase of infection. The lowest concentration of SARS-CoV-2 viral copies this assay can detect is 138 copies/mL. A negative result does not preclude SARS-Cov-2 infection and should not be used as the sole basis for treatment or other patient management decisions. A negative result may occur with  improper specimen collection/handling, submission  of specimen other than nasopharyngeal swab, presence of viral mutation(s) within the areas targeted by this  assay, and inadequate number of viral copies(<138 copies/mL). A negative result must be combined with clinical observations, patient history, and epidemiological information. The expected result is Negative.  Fact Sheet for Patients:  EntrepreneurPulse.com.au  Fact Sheet for Healthcare Providers:  IncredibleEmployment.be  This test is no t yet approved or cleared by the [place] FDA and  has been authorized for detection and/or diagnosis of SARS-CoV-2 by FDA under an Emergency Use Authorization (EUA). This EUA will remain  in effect (meaning this test can be used) for the duration of the COVID-19 declaration under Section 564(b)(1) of the Act, 21 U.S.C.section 360bbb-3(b)(1), unless the authorization is terminated  or revoked sooner.       Influenza A by PCR NEGATIVE NEGATIVE Final   Influenza B by PCR NEGATIVE NEGATIVE Final    Comment: (NOTE) The Xpert Xpress SARS-CoV-2/FLU/RSV plus assay is intended as an aid in the diagnosis of influenza from Nasopharyngeal swab specimens and should not be used as a sole basis for treatment. Nasal washings and aspirates are unacceptable for Xpert Xpress SARS-CoV-2/FLU/RSV testing.  Fact Sheet for Patients: EntrepreneurPulse.com.au  Fact Sheet for Healthcare Providers: IncredibleEmployment.be  This test is not yet approved or cleared by the [place] FDA and has been authorized for detection and/or diagnosis of SARS-CoV-2 by FDA under an Emergency Use Authorization (EUA). This EUA will remain in effect (meaning this test can be used) for the duration of the COVID-19 declaration under Section 564(b)(1) of the Act, 21 U.S.C. section 360bbb-3(b)(1), unless the authorization is terminated or revoked.     Resp Syncytial Virus by PCR NEGATIVE NEGATIVE Final    Comment: (NOTE) Fact Sheet for Patients: EntrepreneurPulse.com.au  Fact Sheet for  Healthcare Providers: IncredibleEmployment.be  This test is not yet approved or cleared by the [place] FDA and has been authorized for detection and/or diagnosis of SARS-CoV-2 by FDA under an Emergency Use Authorization (EUA). This EUA will remain in effect (meaning this test can be used) for the duration of the COVID-19 declaration under Section 564(b)(1) of the Act, 21 U.S.C. section 360bbb-3(b)(1), unless the authorization is terminated or revoked.  Performed at [facility], [address], [place], [place] [address]   Blood Culture (routine x 2)     Status: None (Preliminary result)   Collection Time: 12/06/22 11:17 AM   Specimen: BLOOD  Result Value Ref Range Status   Specimen Description BLOOD RIGHT ASSIST CONTROL  Final   Special Requests   Final    BOTTLES DRAWN AEROBIC AND ANAEROBIC Blood Culture results may not be optimal due to an excessive volume of blood received in culture bottles   Culture   Final    NO GROWTH 2 DAYS Performed at [facility], [address] [place], [place] [address]    Report Status PENDING  Incomplete  Blood Culture (routine x 2)     Status: None (Preliminary result)   Collection Time: 12/06/22 11:17 AM   Specimen: BLOOD  Result Value Ref Range Status   Specimen Description   Final    BLOOD BLOOD RIGHT ARM Performed at [facility], [address], [place], [place] [address]    Special Requests   Final    BOTTLES DRAWN AEROBIC AND ANAEROBIC Blood Culture adequate volume Performed at [facility], [address] [place], [place] [address]    Culture  Setup Time   Final    GRAM POSITIVE  RODS Gram Stain Report Called to,Read Back By and Verified With: CATES LAND M ON 12/07/22 AT 2230 BY LOY,C AEROBIC BOTTLE PERFORMED AT [facility]    Culture GRAM POSITIVE RODS  Final   Report Status PENDING  Incomplete  Urine Culture     Status: Abnormal   Collection Time: 12/06/22  2:00 PM   Specimen: Urine, Random  Result Value Ref Range  Status   Specimen Description   Final    URINE, RANDOM Performed at [facility], [address] [place], [place] [address]    Special Requests   Final    NONE Reflexed from [id number] Performed at [facility], [address], [place], [place] [address]    Culture MULTIPLE SPECIES PRESENT, SUGGEST RECOLLECTION (A)  Final   Report Status 12/08/2022 FINAL  Final    Radiology Reports No results found.  SIGNED: Deatra James, MD, FHM. FAAFP. Zacarias Pontes - [facility] Time spent > 55 min.  In seeing, evaluating and examining the patient. Reviewing medical records, labs, drawn plan of care. [facility],  Pager (please use [email or web address] to page/ text) Please use Epic Secure Chat for non-urgent communication (7AM-7PM)  If 7PM-7AM, please contact night-coverage [email or web address], 12/08/2022, 12:08 PM

## 2022-12-09 DIAGNOSIS — G9341 Metabolic encephalopathy: Secondary | ICD-10-CM | POA: Diagnosis not present

## 2022-12-09 LAB — BASIC METABOLIC PANEL
Anion gap: 5 (ref 5–15)
BUN: 12 mg/dL (ref 6–20)
CO2: 28 mmol/L (ref 22–32)
Calcium: 8 mg/dL — ABNORMAL LOW (ref 8.9–10.3)
Chloride: 103 mmol/L (ref 98–111)
Creatinine, Ser: 0.79 mg/dL (ref 0.61–1.24)
GFR, Estimated: >60 mL/min (ref >60)
Glucose, Bld: 85 mg/dL (ref 70–99)
Potassium: 3.3 mmol/L — ABNORMAL LOW (ref 3.5–5.1)
Sodium: 136 mmol/L (ref 135–145)

## 2022-12-09 LAB — CBC
HCT: 21.9 % — ABNORMAL LOW (ref 39.0–52.0)
Hemoglobin: 6.5 g/dL — CL (ref 13.0–17.0)
MCH: 27.8 pg (ref 26.0–34.0)
MCHC: 29.7 g/dL — ABNORMAL LOW (ref 30.0–36.0)
MCV: 93.6 fL (ref 80.0–100.0)
Platelets: 425 10*3/uL — ABNORMAL HIGH (ref 150–400)
RBC: 2.34 MIL/uL — ABNORMAL LOW (ref 4.22–5.81)
RDW: 14.6 % (ref 11.5–15.5)
WBC: 9.7 10*3/uL (ref 4.0–10.5)
nRBC: 0 % (ref 0.0–0.2)

## 2022-12-09 LAB — IRON AND TIBC
Iron: 33 ug/dL — ABNORMAL LOW (ref 45–182)
Saturation Ratios: 33 % (ref 17.9–39.5)
TIBC: 101 ug/dL — ABNORMAL LOW (ref 250–450)
UIBC: 68 ug/dL

## 2022-12-09 LAB — VITAMIN B12: Vitamin B-12: 220 pg/mL (ref 180–914)

## 2022-12-09 LAB — FOLATE: Folate: 9.8 ng/mL (ref >5.9)

## 2022-12-09 LAB — PREPARE RBC (CROSSMATCH)

## 2022-12-09 MED ORDER — GABAPENTIN 100 MG PO CAPS
100.0000 mg | ORAL_CAPSULE | Freq: Two times a day (BID) | ORAL | Status: DC
  Administered 2022-12-09 – 2022-12-15 (×13): 100 mg via ORAL
  Filled 2022-12-09 (×13): qty 1

## 2022-12-09 MED ORDER — SODIUM CHLORIDE 0.9% IV SOLUTION: Freq: Once | INTRAVENOUS | Status: AC

## 2022-12-09 MED ORDER — HYDROCODONE-ACETAMINOPHEN 5-325 MG PO TABS
1.0000 | ORAL_TABLET | Freq: Four times a day (QID) | ORAL | Status: DC | PRN
  Administered 2022-12-09 – 2022-12-14 (×5): 1 via ORAL
  Filled 2022-12-09 (×5): qty 1

## 2022-12-09 MED ORDER — POTASSIUM CHLORIDE CRYS ER 20 MEQ PO TBCR
40.0000 meq | EXTENDED_RELEASE_TABLET | Freq: Once | ORAL | Status: AC
  Administered 2022-12-09: 40 meq via ORAL
  Filled 2022-12-09: qty 2

## 2022-12-09 MED ORDER — NICOTINE 7 MG/24HR TD PT24
7.0000 mg | MEDICATED_PATCH | Freq: Every day | TRANSDERMAL | Status: DC
  Administered 2022-12-09 – 2022-12-15 (×7): 7 mg via TRANSDERMAL
  Filled 2022-12-09 (×7): qty 1

## 2022-12-09 NOTE — Progress Notes (Signed)
Physical Therapy Treatment Patient Details Name: [name] MRN: [id number] DOB: Mar 23, 1986 Today's Date: 12/09/2022   History of Present Illness [name]  is a 37 y.o. male with PMHx relevant for  HIV, Bipolar 1 disorder/Schizoaffective disorder/PTSD/Depression/anxiety , HTN, FTT, Chronic hidradenitis suppurativa involving groin creases, pubic region, buttocks , Anemia of chronic disease, alcoholic liver cirrhosis, GERD and history of orthostatic hypotension in the setting of autonomic dysfunction on chronic decubitus ulcers in the setting of hidradenitis suppurativa presents from home by EMS ??  Altered mentation in the setting of poor oral intake--- he apparently has a home health CNA--- who quit about a week ago show patient does not have sufficient care at home.  -Patient states she has not had oral intake for 2 days  --Apparently patient has been less awake and at times more confused  -He was covered in feces when EMS got there he had a low blood pressure EMS gave him NS bolus of 300 mL x 1  -No fevers no emesis, no BM in about 2 days    PT Comments    Patient demonstrates slow labored movement for sitting up at bedside, able to use hands to grip bed rails during supine to sitting, requires Max assist for scooting forward mostly due to avoiding shearing of buttock wounds, able to stand with RW, but unable to take steps due to weakness.  Patient demonstrates fair/good return for scooting laterally at bedside using BUE with right foot blocked to avoid sliding forward.  Patient put back to bed with Mod/max assist to reposition.  Patient will benefit from continued skilled physical therapy in hospital and recommended venue below to increase strength, balance, endurance for safe ADLs and gait.     Recommendations for follow up therapy are one component of a multi-disciplinary discharge planning process, led by the attending physician.  Recommendations may be updated based on patient status,  additional functional criteria and insurance authorization.  Follow Up Recommendations  Skilled nursing-short term rehab (<3 hours/day) Can patient physically be transported by private vehicle: No   Assistance Recommended at Discharge Intermittent Supervision/Assistance  Patient can return home with the following A lot of help with bathing/dressing/bathroom;A lot of help with walking and/or transfers;Help with stairs or ramp for entrance;Assistance with cooking/housework   Equipment Recommendations  None recommended by PT    Recommendations for Other Services       Precautions / Restrictions Precautions Precautions: Fall Restrictions Weight Bearing Restrictions: No     Mobility  Bed Mobility Overal bed mobility: Needs Assistance Bed Mobility: Supine to Sit, Sit to Supine     Supine to sit: Mod assist, HOB elevated Sit to supine: Mod assist   General bed mobility comments: slow labored movement requiring use of bed rail for sitting up    Transfers Overall transfer level: Needs assistance Equipment used: Rolling walker (2 wheels) Transfers: Sit to/from Stand Sit to Stand: Mod assist, From elevated surface           General transfer comment: able to partially extend knees for completing sit to stands    Ambulation/Gait                   Stairs             Wheelchair Mobility    Modified Rankin (Stroke Patients Only)       Balance Overall balance assessment: Needs assistance Sitting-balance support: Feet supported, No upper extremity supported Sitting balance-Leahy Scale: Fair Sitting balance - Comments:  seated at EOB Postural control: Posterior lean Standing balance support: Bilateral upper extremity supported, Reliant on assistive device for balance Standing balance-Leahy Scale: Fair Standing balance comment: fair/poor using RW                            Cognition Arousal/Alertness: Awake/alert Behavior During Therapy: WFL  for tasks assessed/performed Overall Cognitive Status: History of cognitive impairments - at baseline                                          Exercises      General Comments General comments (skin integrity, edema, etc.): several posterior bed sores      Pertinent Vitals/Pain Pain Assessment Pain Assessment: 0-10 Pain Score: 8  Pain Location: back Pain Descriptors / Indicators: Aching Pain Intervention(s): Limited activity within patient's tolerance, Monitored during session, Repositioned    Home Living Family/patient expects to be discharged to:: Private residence Living Arrangements: Alone Available Help at Discharge: Family;Available PRN/intermittently;Personal care attendant Type of Home: Apartment Home Access: Stairs to enter Entrance Stairs-Rails: Right Entrance Stairs-Number of Steps: 3   Home Layout: One level Home Equipment: Conservation officer, nature (2 wheels);Cane - single point;Wheelchair - manual;Hand held shower head;Tub bench;BSC/3in1;Hospital bed Additional Comments: Pt reports having a lift recliner and WC    Prior Function            PT Goals (current goals can now be found in the care plan section) Acute Rehab PT Goals Patient Stated Goal: return home PT Goal Formulation: With patient Time For Goal Achievement: 12/21/22 Potential to Achieve Goals: Good Progress towards PT goals: Progressing toward goals    Frequency    Min 3X/week      PT Plan Current plan remains appropriate    Co-evaluation PT/OT/SLP Co-Evaluation/Treatment: Yes Reason for Co-Treatment: To address functional/ADL transfers PT goals addressed during session: Mobility/safety with mobility;Balance;Proper use of DME        AM-PAC PT "6 Clicks" Mobility   Outcome Measure  Help needed turning from your back to your side while in a flat bed without using bedrails?: A Lot Help needed moving from lying on your back to sitting on the side of a flat bed without using  bedrails?: A Lot Help needed moving to and from a bed to a chair (including a wheelchair)?: Total Help needed standing up from a chair using your arms (e.g., wheelchair or bedside chair)?: A Lot Help needed to walk in hospital room?: Total Help needed climbing 3-5 steps with a railing? : Total 6 Click Score: 9    End of Session   Activity Tolerance: Patient tolerated treatment well;Patient limited by fatigue Patient left: in bed;with call bell/phone within reach Nurse Communication: Mobility status PT Visit Diagnosis: Unsteadiness on feet (R26.81);Other abnormalities of gait and mobility (R26.89);Muscle weakness (generalized) (M62.81)     Time: [id number] PT Time Calculation (min) (ACUTE ONLY): 32 min  Charges:  $Therapeutic Activity: 23-37 mins                     12:19 PM, 12/09/22 Lonell Grandchild, MPT Physical Therapist with [facility] [phone] office [phone] mobile phone

## 2022-12-09 NOTE — Plan of Care (Signed)
  Problem: Acute Rehab OT Goals (only OT should resolve) Goal: Pt. Will Perform Upper Body Bathing Flowsheets (Taken 12/09/2022 0943) Pt Will Perform Upper Body Bathing: with set-up Goal: Pt. Will Perform Lower Body Dressing Flowsheets (Taken 12/09/2022 0943) Pt Will Perform Lower Body Dressing: with min assist Goal: Pt. Will Transfer To Toilet Flowsheets (Taken 12/09/2022 [phone]) Pt Will Transfer to Toilet: with min assist    Arvil Persons, OTR/L

## 2022-12-09 NOTE — Progress Notes (Signed)
Date and time results received: 12/09/22 0613  Test: HGB Critical Value: 6.5  Name of Provider Notified: Dr. Clearence Ped  Orders Received? Or Actions Taken?: notified, awaiting further orders

## 2022-12-09 NOTE — TOC Progression Note (Addendum)
Transition of Care [facility]) - Progression Note    Patient Details  Name: [name] MRN: [id number] Date of Birth: 28-Dec-1985  [facility] CM/SW Contact  Boneta Lucks, RN Phone Number: 12/09/2022, 11:05 AM  Clinical Narrative:   Patient is on DTP list, others working on his case as well. CM called mother to update that patient is improving and will soon be medically ready to discharge. We have sent out to multiple facilities and have not gotten any bed offers. She states she can not take care of him. She is asking that I call her [facility] at [phone]. I left her a message. TOC follow.    AddendumMarsh Dolly from [facility] called back. They are also searching for patient a bed. Getting turned down due to young age. She will reach out [facility] to see if they will response. TOC has left multiple message with no call back.     Expected Discharge Plan: [place] Barriers to Discharge: Continued Medical Work up  Expected Discharge Plan and Services   Social Determinants of Health (SDOH) Interventions SDOH Screenings   Alcohol Screen: Medium Risk (07/22/2018)  Tobacco Use: High Risk (12/06/2022)    Readmission Risk Interventions    02/15/2022    2:35 PM 08/24/2021    4:00 PM 08/24/2021   11:54 AM  Readmission Risk Prevention Plan  Transportation Screening Complete Complete   PCP or Specialist Appt within 3-5 Days  Complete Complete  HRI or [place]  Complete Complete  Social Work Consult for [place] Planning/Counseling   Complete  Palliative Care Screening  Complete Complete  Medication Review Press photographer) Complete Complete Complete  PCP or Specialist appointment within 3-5 days of discharge Complete    HRI or [place] Complete    SW Recovery Care/Counseling Consult Complete    [place] Not Applicable

## 2022-12-09 NOTE — Progress Notes (Signed)
PROGRESS NOTE    Patient: [name]                            PCP: [name], MD                    DOB: 1986/08/10            DOA: 12/06/2022 [id number]             DOS: 12/09/2022, 10:52 AM   LOS: 3 days   Date of Service: The patient was seen and examined on 12/09/2022  Subjective:   The patient was seen and examined awake alert oriented this morning, no acute distress Noted for drop in H&H from 7.7--->7.0>>> 6.5 no signs of bleeding No reported bleeding by patient or nursing staff   Brief Narrative:   [name] is  a 37 year old male apparently bedbound from home with significant history of HIV,  , HTN, FTT, Chronic hidradenitis suppurativa involving groin creases, pubic region, buttocks , Anemia of chronic disease, alcoholic liver cirrhosis, GERD and history of orthostatic hypotension in the setting of autonomic dysfunction on chronic decubitus ulcers in the setting of hidradenitis suppurativa presents from home by EMS ??  Altered mentation in the setting of poor oral intake .   Assessment & Plan:   Principal Problem:   Acute metabolic encephalopathy Active Problems:   UTI (urinary tract infection)   Protein-calorie malnutrition, severe (HCC)   GERD (gastroesophageal reflux disease)   HIV disease (HCC)   Anemia   Avascular necrosis of femoral head, left (HCC)   FTT (failure to thrive) in adult   Alcoholic cirrhosis of liver (HCC)   Bipolar disorder (HCC)   Acute metabolic encephalopathy- -mentation is improved, awake alert, following command this morning -Altered mental status likely related to: UTI, proctitis, dehydration -Multiple hospitalization,  -Vital stable  -COVID,  flu and RSV negative -Hydrate and treat infection    Acute on chronic anemia - Anemia of chronic disease -No active signs of bleeding -Likely multifactorial-exacerbated by proctitis, chronic groin wounds, hemodilution    Latest Ref Rng & Units 12/09/2022    4:43 AM  12/08/2022    4:51 AM 12/07/2022    5:14 AM  CBC  WBC 4.0 - 10.5 K/uL 9.7  9.7  9.2   Hemoglobin 13.0 - 17.0 g/dL 6.5  7.0  7.7   Hematocrit 39.0 - 52.0 % 21.9  23.8  25.9   Platelets 150 - 400 K/uL 425  403  449    -Transfusing 2U PRBC today -Obtaining iron studies, Hemoccult, B12, folate -Discontinue heparin for DVT prophylaxis  proctitis--- patient received cefepime and Vanco in the ED --CT abdomen pelvis with possible proctitis - WBC is up to 14.4 >>>9.7  -  Lactic acid is 2.5 Treat empirically with Rocephin and Flagyl   UTI-- +ve Dysuria, --UA suggestive of UTI -WBC is up to 14.4,  - Lactic acid is 2.5 >> 1.8  - IV Rocephin as above pending further culture data   HIV, on HAART Cont home meds.  Biktarvy -[facility]    Failure to thrive in adult -Bedbound, -Give Ensure nutritional supplements Liberalize diet.  On folic acid, multivitamin, thiamine   Bipolar 1 disorder/Schizoaffective disorder/PTSD/Depression/anxiety-- Resume home regimen-including Restoril, trazodone, Lexapro and Latuda -Xanax as ordered -History of noncompliance with medications   Orthostatic hypotension/autonomic dysfunction -Patient is bedbound -- Increased Midodrine to 15 mg  p.o. 3 times daily AC   GERD -Pepcid as ordered   Chronic hidradenitis suppurativa involving groin creases, pubic region, buttocks --see photos in epic -Decubitus ulcers-noted they do not look infected at this time -Some of the wounds are deep and tracking   Status is: Inpatient   Remains inpatient appropriate because:    Dispo: The patient is from: Home              Anticipated d/c is to: SNF              Anticipated d/c date is: 2 days              Patient currently is not medically stable to d/c. Barriers: Not Clinically Stable-also difficult to discharge, mom unable to take care of him at home, he is now bedbound, needing assist with all  ADLs   ------------------------------------------------------------------------------------------------------------------------- Nutritional status:  The patient's BMI is: Body mass index is 18.46 kg/m. I agree with the assessment and plan as outlined below: Nutrition Status: Nutrition Problem: Severe Malnutrition Etiology: chronic illness, catabolic illness (cirrhosis, HIV, numerous chronic pressure injuries) Signs/Symptoms: energy intake < or equal to 75% for > or equal to 1 month, percent weight loss Percent weight loss: 17 % (x 3 months and 39 % loss x 27 months) Interventions: Boost Plus, Liberalize Diet, Education, Prostat, Juven (vitamin c and thiamine daily)     Skin Assessment: I have examined the patient's skin and I agree with the wound assessment as performed by wound care team As outlined belowe: Pressure Injury 08/27/22 Buttocks Bilateral Stage 2 -  Partial thickness loss of dermis presenting as a shallow open injury with a red, pink wound bed without slough. (Active)  08/27/22   Location: Buttocks  Location Orientation: Bilateral  Staging: Stage 2 -  Partial thickness loss of dermis presenting as a shallow open injury with a red, pink wound bed without slough.  Wound Description (Comments):   Present on Admission: Yes     Pressure Injury 12/06/22 Buttocks Right;Distal Stage 3 -  Full thickness tissue loss. Subcutaneous fat may be visible but bone, tendon or muscle are NOT exposed. (Active)  12/06/22 1826  Location: Buttocks  Location Orientation: Right;Distal  Staging: Stage 3 -  Full thickness tissue loss. Subcutaneous fat may be visible but bone, tendon or muscle are NOT exposed.  Wound Description (Comments):   Present on Admission: Yes  Dressing Type Foam - Lift dressing to assess site every shift;Other (Comment) (aquacel) 12/09/22 0246     Pressure Injury 12/06/22 Thigh Right;Upper Stage 3 -  Full thickness tissue loss. Subcutaneous fat may be visible but  bone, tendon or muscle are NOT exposed. (Active)  12/06/22 1700  Location: Thigh  Location Orientation: Right;Upper  Staging: Stage 3 -  Full thickness tissue loss. Subcutaneous fat may be visible but bone, tendon or muscle are NOT exposed.  Wound Description (Comments):   Present on Admission: Yes  Dressing Type Foam - Lift dressing to assess site every shift;Other (Comment) (aquacell) 12/09/22 0246     Pressure Injury 12/06/22 Hip Right Stage 2 -  Partial thickness loss of dermis presenting as a shallow open injury with a red, pink wound bed without slough. (Active)  12/06/22 1700  Location: Hip  Location Orientation: Right  Staging: Stage 2 -  Partial thickness loss of dermis presenting as a shallow open injury with a red, pink wound bed without slough.  Wound Description (Comments):   Present on Admission: Yes  Dressing  Type Foam - Lift dressing to assess site every shift;Other (Comment) (aquacell) 12/09/22 0246     Pressure Injury 12/06/22 Sacrum Right Stage 3 -  Full thickness tissue loss. Subcutaneous fat may be visible but bone, tendon or muscle are NOT exposed. (Active)  12/06/22 1700  Location: Sacrum  Location Orientation: Right  Staging: Stage 3 -  Full thickness tissue loss. Subcutaneous fat may be visible but bone, tendon or muscle are NOT exposed.  Wound Description (Comments):   Present on Admission: Yes  Dressing Type Foam - Lift dressing to assess site every shift 12/09/22 0246     Pressure Injury 12/06/22 Thigh Left;Medial Stage 2 -  Partial thickness loss of dermis presenting as a shallow open injury with a red, pink wound bed without slough. (Active)  12/06/22 1833  Location: Thigh  Location Orientation: Left;Medial  Staging: Stage 2 -  Partial thickness loss of dermis presenting as a shallow open injury with a red, pink wound bed without slough.  Wound Description (Comments):   Present on Admission: Yes  Dressing Type Foam - Lift dressing to assess site every  shift 12/09/22 0246     Pressure Injury 12/06/22 Thigh Left;Mid;Upper Stage 3 -  Full thickness tissue loss. Subcutaneous fat may be visible but bone, tendon or muscle are NOT exposed. (Active)  12/06/22 1700  Location: Thigh  Location Orientation: Left;Mid;Upper  Staging: Stage 3 -  Full thickness tissue loss. Subcutaneous fat may be visible but bone, tendon or muscle are NOT exposed.  Wound Description (Comments):   Present on Admission: Yes  Dressing Type Foam - Lift dressing to assess site every shift 12/09/22 0246     Pressure Injury 12/06/22 Knee Left Unstageable - Full thickness tissue loss in which the base of the injury is covered by slough (yellow, tan, gray, green or brown) and/or eschar (tan, brown or black) in the wound bed. (Active)  12/06/22 1700  Location: Knee  Location Orientation: Left  Staging: Unstageable - Full thickness tissue loss in which the base of the injury is covered by slough (yellow, tan, gray, green or brown) and/or eschar (tan, brown or black) in the wound bed.  Wound Description (Comments):   Present on Admission: Yes  Dressing Type Foam - Lift dressing to assess site every shift 12/08/22 2109     Pressure Injury 12/06/22 Heel Left;Lateral Deep Tissue Pressure Injury - Purple or maroon localized area of discolored intact skin or blood-filled blister due to damage of underlying soft tissue from pressure and/or shear. (Active)  12/06/22 1700  Location: Heel  Location Orientation: Left;Lateral  Staging: Deep Tissue Pressure Injury - Purple or maroon localized area of discolored intact skin or blood-filled blister due to damage of underlying soft tissue from pressure and/or shear.  Wound Description (Comments):   Present on Admission: Yes  Dressing Type Foam - Lift dressing to assess site every shift 12/08/22 2109     Pressure Injury 12/06/22 Heel Left;Proximal Deep Tissue Pressure Injury - Purple or maroon localized area of discolored intact skin or  blood-filled blister due to damage of underlying soft tissue from pressure and/or shear. (Active)  12/06/22 1700  Location: Heel  Location Orientation: Left;Proximal  Staging: Deep Tissue Pressure Injury - Purple or maroon localized area of discolored intact skin or blood-filled blister due to damage of underlying soft tissue from pressure and/or shear.  Wound Description (Comments):   Present on Admission: Yes  Dressing Type Foam - Lift dressing to assess site every shift 12/08/22 2109  Pressure Injury 12/06/22 Foot Left;Posterior;Upper Deep Tissue Pressure Injury - Purple or maroon localized area of discolored intact skin or blood-filled blister due to damage of underlying soft tissue from pressure and/or shear. (Active)  12/06/22 1842  Location: Foot  Location Orientation: Left;Posterior;Upper  Staging: Deep Tissue Pressure Injury - Purple or maroon localized area of discolored intact skin or blood-filled blister due to damage of underlying soft tissue from pressure and/or shear.  Wound Description (Comments):   Present on Admission: Yes  Dressing Type Foam - Lift dressing to assess site every shift 12/08/22 2109     Pressure Injury 12/06/22 Heel Right Deep Tissue Pressure Injury - Purple or maroon localized area of discolored intact skin or blood-filled blister due to damage of underlying soft tissue from pressure and/or shear. (Active)  12/06/22 1700  Location: Heel  Location Orientation: Right  Staging: Deep Tissue Pressure Injury - Purple or maroon localized area of discolored intact skin or blood-filled blister due to damage of underlying soft tissue from pressure and/or shear.  Wound Description (Comments):   Present on Admission: Yes  Dressing Type Foam - Lift dressing to assess site every shift 12/08/22 2109     Pressure Injury 12/06/22 Foot Left;Lateral Deep Tissue Pressure Injury - Purple or maroon localized area of discolored intact skin or blood-filled blister due to  damage of underlying soft tissue from pressure and/or shear. (Active)  12/06/22 1700  Location: Foot  Location Orientation: Left;Lateral  Staging: Deep Tissue Pressure Injury - Purple or maroon localized area of discolored intact skin or blood-filled blister due to damage of underlying soft tissue from pressure and/or shear.  Wound Description (Comments):   Present on Admission: Yes  Dressing Type Foam - Lift dressing to assess site every shift 12/08/22 2109     Pressure Injury 12/06/22 Foot Left;Posterior;Upper Deep Tissue Pressure Injury - Purple or maroon localized area of discolored intact skin or blood-filled blister due to damage of underlying soft tissue from pressure and/or shear. (Active)  12/06/22 1700  Location: Foot  Location Orientation: Left;Posterior;Upper  Staging: Deep Tissue Pressure Injury - Purple or maroon localized area of discolored intact skin or blood-filled blister due to damage of underlying soft tissue from pressure and/or shear.  Wound Description (Comments):   Present on Admission: Yes  Dressing Type Foam - Lift dressing to assess site every shift 12/08/22 2109     ----------------------------------------------------------------------------------------------------------------------- Cultures; Blood Cultures x 2 >> NGT -------------------------------------------------------------------------------------------------------------------------  DVT prophylaxis:  Place and maintain sequential compression device Start: 12/08/22 1215 SCDs Start: 12/06/22 1630   Code Status:   Code Status: Full Code  Family Communication: Attempted to call his mother Mrs. Sigourney care planning has been discussed.   Admission status:   Status is: Inpatient Remains inpatient appropriate because: Eating IV fluids, IV antibiotics,   Disposition: From  - home             Planning for discharge in 1-2 days: to SNF   Procedures:   No admission procedures for  hospital encounter.   Antimicrobials:  Anti-infectives (From admission, onward)    Start     Dose/Rate Route Frequency Ordered Stop   12/08/22 2200  metroNIDAZOLE (FLAGYL) tablet 500 mg        500 mg Oral Every 8 hours 12/08/22 1212     12/07/22 0200  vancomycin (VANCOREADY) IVPB 750 mg/150 mL        750 mg 150 mL/hr over 60 Minutes Intravenous Every 12 hours 12/06/22 1409 12/07/22  0215   12/06/22 2200  cefTRIAXone (ROCEPHIN) 1 g in sodium chloride 0.9 % 100 mL IVPB        1 g 200 mL/hr over 30 Minutes Intravenous Every 24 hours 12/06/22 1844     12/06/22 2200  metroNIDAZOLE (FLAGYL) IVPB 500 mg  Status:  Discontinued        500 mg 100 mL/hr over 60 Minutes Intravenous Every 12 hours 12/06/22 1845 12/08/22 1212   12/06/22 2100  ceFEPIme (MAXIPIME) 2 g in sodium chloride 0.9 % 100 mL IVPB  Status:  Discontinued        2 g 200 mL/hr over 30 Minutes Intravenous Every 8 hours 12/06/22 1314 12/06/22 1844   12/06/22 1800  bictegravir-emtricitabine-tenofovir AF (BIKTARVY) 50-200-25 MG per tablet 1 tablet        1 tablet Oral Daily 12/06/22 1633     12/06/22 1800  valACYclovir (VALTREX) tablet 1,000 mg        1,000 mg Oral Daily 12/06/22 1633     12/06/22 1300  vancomycin (VANCOREADY) IVPB 1500 mg/300 mL        1,500 mg 150 mL/hr over 120 Minutes Intravenous  Once 12/06/22 1245 12/06/22 1555   12/06/22 1230  ceFEPIme (MAXIPIME) 2 g in sodium chloride 0.9 % 100 mL IVPB        2 g 200 mL/hr over 30 Minutes Intravenous  Once 12/06/22 1229 12/06/22 1315   12/06/22 1230  metroNIDAZOLE (FLAGYL) IVPB 500 mg        500 mg 100 mL/hr over 60 Minutes Intravenous  Once 12/06/22 1229 12/06/22 1505   12/06/22 1230  vancomycin (VANCOCIN) IVPB 1000 mg/200 mL premix  Status:  Discontinued        1,000 mg 200 mL/hr over 60 Minutes Intravenous  Once 12/06/22 1229 12/06/22 1245        Medication:   (feeding supplement) PROSource Plus  30 mL Oral BID BM   sodium chloride   Intravenous Once   ascorbic  acid  500 mg Oral Daily   bictegravir-emtricitabine-tenofovir AF  1 tablet Oral Daily   escitalopram  20 mg Oral Daily   feeding supplement  237 mL Oral BID BM   lactobacillus  1 g Oral TID   lurasidone  120 mg Oral Daily   metroNIDAZOLE  500 mg Oral Q8H   midodrine  10 mg Oral TID WC   multivitamin with minerals  1 tablet Oral Daily   nutrition supplement (JUVEN)  1 packet Oral BID BM   senna-docusate  2 tablet Oral QHS   sodium chloride flush  3 mL Intravenous Q12H   temazepam  30 mg Oral QHS   thiamine  100 mg Oral Daily   trazodone  200 mg Oral QHS   valACYclovir  1,000 mg Oral Daily    acetaminophen **OR** acetaminophen, ALPRAZolam, bisacodyl, cyclobenzaprine, diphenhydrAMINE, diphenoxylate-atropine, polyethylene glycol   Objective:   Vitals:   12/08/22 1311 12/08/22 2113 12/09/22 0510 12/09/22 0705  BP: 119/70 107/67 102/60 (!) 92/55  Pulse: (!) 106 100 (!) 103 (!) 104  Resp: '20 18 16 14  '$ Temp: 97.6 F (36.4 C) 99 F (37.2 C) 98.4 F (36.9 C) 98.5 F (36.9 C)  TempSrc: Oral Oral Oral Oral  SpO2: 100% 100% 100% 100%  Weight:      Height:        Intake/Output Summary (Last 24 hours) at 12/09/2022 1052 Last data filed at 12/09/2022 0500 Gross per 24 hour  Intake 1460 ml  Output  2100 ml  Net -640 ml   Filed Weights   12/06/22 1010  Weight: 67 kg     Physical examination:        General:  AAO x 2,  cooperative, no distress;   HEENT:  Normocephalic, PERRL, otherwise with in Normal limits   Neuro:  CNII-XII intact. , normal motor and sensation, reflexes intact   Lungs:   Clear to auscultation BL, Respirations unlabored,  No wheezes / crackles  Cardio:    S1/S2, RRR, No murmure, No Rubs or Gallops   Abdomen:  Soft, non-tender, bowel sounds active all four quadrants, no guarding or peritoneal signs.  Muscular  skeletal:  Limited exam -severe global generalized weaknesses - in bed, able to move all 4 extremities,   2+ pulses,  symmetric, No pitting edema   Skin:  Dry, warm to touch, pictures in media regarding wounds in the groin and lower extremity area  Wounds: Please see nursing documentation -see pics and description below  Pressure Injury 08/27/22 Buttocks Bilateral Stage 2 -  Partial thickness loss of dermis presenting as a shallow open injury with a red, pink wound bed without slough. (Active)  08/27/22   Location: Buttocks  Location Orientation: Bilateral  Staging: Stage 2 -  Partial thickness loss of dermis presenting as a shallow open injury with a red, pink wound bed without slough.  Wound Description (Comments):   Present on Admission: Yes     Pressure Injury 12/06/22 Buttocks Right;Distal Stage 3 -  Full thickness tissue loss. Subcutaneous fat may be visible but bone, tendon or muscle are NOT exposed. (Active)  12/06/22 1826  Location: Buttocks  Location Orientation: Right;Distal  Staging: Stage 3 -  Full thickness tissue loss. Subcutaneous fat may be visible but bone, tendon or muscle are NOT exposed.  Wound Description (Comments):   Present on Admission: Yes  Dressing Type Foam - Lift dressing to assess site every shift;Other (Comment) (aquacel) 12/09/22 0246     Pressure Injury 12/06/22 Thigh Right;Upper Stage 3 -  Full thickness tissue loss. Subcutaneous fat may be visible but bone, tendon or muscle are NOT exposed. (Active)  12/06/22 1700  Location: Thigh  Location Orientation: Right;Upper  Staging: Stage 3 -  Full thickness tissue loss. Subcutaneous fat may be visible but bone, tendon or muscle are NOT exposed.  Wound Description (Comments):   Present on Admission: Yes  Dressing Type Foam - Lift dressing to assess site every shift;Other (Comment) (aquacell) 12/09/22 0246     Pressure Injury 12/06/22 Hip Right Stage 2 -  Partial thickness loss of dermis presenting as a shallow open injury with a red, pink wound bed without slough. (Active)  12/06/22 1700  Location: Hip  Location Orientation: Right  Staging: Stage 2  -  Partial thickness loss of dermis presenting as a shallow open injury with a red, pink wound bed without slough.  Wound Description (Comments):   Present on Admission: Yes  Dressing Type Foam - Lift dressing to assess site every shift;Other (Comment) (aquacell) 12/09/22 0246     Pressure Injury 12/06/22 Sacrum Right Stage 3 -  Full thickness tissue loss. Subcutaneous fat may be visible but bone, tendon or muscle are NOT exposed. (Active)  12/06/22 1700  Location: Sacrum  Location Orientation: Right  Staging: Stage 3 -  Full thickness tissue loss. Subcutaneous fat may be visible but bone, tendon or muscle are NOT exposed.  Wound Description (Comments):   Present on Admission: Yes  Dressing Type Foam - Lift  dressing to assess site every shift 12/09/22 0246     Pressure Injury 12/06/22 Thigh Left;Medial Stage 2 -  Partial thickness loss of dermis presenting as a shallow open injury with a red, pink wound bed without slough. (Active)  12/06/22 1833  Location: Thigh  Location Orientation: Left;Medial  Staging: Stage 2 -  Partial thickness loss of dermis presenting as a shallow open injury with a red, pink wound bed without slough.  Wound Description (Comments):   Present on Admission: Yes  Dressing Type Foam - Lift dressing to assess site every shift 12/09/22 0246     Pressure Injury 12/06/22 Thigh Left;Mid;Upper Stage 3 -  Full thickness tissue loss. Subcutaneous fat may be visible but bone, tendon or muscle are NOT exposed. (Active)  12/06/22 1700  Location: Thigh  Location Orientation: Left;Mid;Upper  Staging: Stage 3 -  Full thickness tissue loss. Subcutaneous fat may be visible but bone, tendon or muscle are NOT exposed.  Wound Description (Comments):   Present on Admission: Yes  Dressing Type Foam - Lift dressing to assess site every shift 12/09/22 0246     Pressure Injury 12/06/22 Knee Left Unstageable - Full thickness tissue loss in which the base of the injury is covered by  slough (yellow, tan, gray, green or brown) and/or eschar (tan, brown or black) in the wound bed. (Active)  12/06/22 1700  Location: Knee  Location Orientation: Left  Staging: Unstageable - Full thickness tissue loss in which the base of the injury is covered by slough (yellow, tan, gray, green or brown) and/or eschar (tan, brown or black) in the wound bed.  Wound Description (Comments):   Present on Admission: Yes  Dressing Type Foam - Lift dressing to assess site every shift 12/08/22 2109     Pressure Injury 12/06/22 Heel Left;Lateral Deep Tissue Pressure Injury - Purple or maroon localized area of discolored intact skin or blood-filled blister due to damage of underlying soft tissue from pressure and/or shear. (Active)  12/06/22 1700  Location: Heel  Location Orientation: Left;Lateral  Staging: Deep Tissue Pressure Injury - Purple or maroon localized area of discolored intact skin or blood-filled blister due to damage of underlying soft tissue from pressure and/or shear.  Wound Description (Comments):   Present on Admission: Yes  Dressing Type Foam - Lift dressing to assess site every shift 12/08/22 2109     Pressure Injury 12/06/22 Heel Left;Proximal Deep Tissue Pressure Injury - Purple or maroon localized area of discolored intact skin or blood-filled blister due to damage of underlying soft tissue from pressure and/or shear. (Active)  12/06/22 1700  Location: Heel  Location Orientation: Left;Proximal  Staging: Deep Tissue Pressure Injury - Purple or maroon localized area of discolored intact skin or blood-filled blister due to damage of underlying soft tissue from pressure and/or shear.  Wound Description (Comments):   Present on Admission: Yes  Dressing Type Foam - Lift dressing to assess site every shift 12/08/22 2109     Pressure Injury 12/06/22 Foot Left;Posterior;Upper Deep Tissue Pressure Injury - Purple or maroon localized area of discolored intact skin or blood-filled blister  due to damage of underlying soft tissue from pressure and/or shear. (Active)  12/06/22 1842  Location: Foot  Location Orientation: Left;Posterior;Upper  Staging: Deep Tissue Pressure Injury - Purple or maroon localized area of discolored intact skin or blood-filled blister due to damage of underlying soft tissue from pressure and/or shear.  Wound Description (Comments):   Present on Admission: Yes  Dressing Type Foam - Lift  dressing to assess site every shift 12/08/22 2109     Pressure Injury 12/06/22 Heel Right Deep Tissue Pressure Injury - Purple or maroon localized area of discolored intact skin or blood-filled blister due to damage of underlying soft tissue from pressure and/or shear. (Active)  12/06/22 1700  Location: Heel  Location Orientation: Right  Staging: Deep Tissue Pressure Injury - Purple or maroon localized area of discolored intact skin or blood-filled blister due to damage of underlying soft tissue from pressure and/or shear.  Wound Description (Comments):   Present on Admission: Yes  Dressing Type Foam - Lift dressing to assess site every shift 12/08/22 2109     Pressure Injury 12/06/22 Foot Left;Lateral Deep Tissue Pressure Injury - Purple or maroon localized area of discolored intact skin or blood-filled blister due to damage of underlying soft tissue from pressure and/or shear. (Active)  12/06/22 1700  Location: Foot  Location Orientation: Left;Lateral  Staging: Deep Tissue Pressure Injury - Purple or maroon localized area of discolored intact skin or blood-filled blister due to damage of underlying soft tissue from pressure and/or shear.  Wound Description (Comments):   Present on Admission: Yes  Dressing Type Foam - Lift dressing to assess site every shift 12/08/22 2109     Pressure Injury 12/06/22 Foot Left;Posterior;Upper Deep Tissue Pressure Injury - Purple or maroon localized area of discolored intact skin or blood-filled blister due to damage of underlying soft  tissue from pressure and/or shear. (Active)  12/06/22 1700  Location: Foot  Location Orientation: Left;Posterior;Upper  Staging: Deep Tissue Pressure Injury - Purple or maroon localized area of discolored intact skin or blood-filled blister due to damage of underlying soft tissue from pressure and/or shear.  Wound Description (Comments):   Present on Admission: Yes  Dressing Type Foam - Lift dressing to assess site every shift 12/08/22 2109          ------------------------------------------------------------------------------------------------------------------------------------------    LABs:     Latest Ref Rng & Units 12/09/2022    4:43 AM 12/08/2022    4:51 AM 12/07/2022    5:14 AM  CBC  WBC 4.0 - 10.5 K/uL 9.7  9.7  9.2   Hemoglobin 13.0 - 17.0 g/dL 6.5  7.0  7.7   Hematocrit 39.0 - 52.0 % 21.9  23.8  25.9   Platelets 150 - 400 K/uL 425  403  449       Latest Ref Rng & Units 12/09/2022    4:43 AM 12/08/2022    4:51 AM 12/07/2022    5:14 AM  CMP  Glucose 70 - 99 mg/dL 85  115  74   BUN 6 - 20 mg/dL '12  8  8   '$ Creatinine 0.61 - 1.24 mg/dL 0.79  0.88  0.85   Sodium 135 - 145 mmol/L 136  135  133   Potassium 3.5 - 5.1 mmol/L 3.3  2.8  3.0   Chloride 98 - 111 mmol/L 103  105  104   CO2 22 - 32 mmol/L '28  24  21   '$ Calcium 8.9 - 10.3 mg/dL 8.0  7.7  8.1        Micro Results Recent Results (from the past 240 hour(s))  Resp panel by RT-PCR (RSV, Flu A&B, Covid) Anterior Nasal Swab     Status: None   Collection Time: 12/06/22 10:56 AM   Specimen: Anterior Nasal Swab  Result Value Ref Range Status   SARS Coronavirus 2 by RT PCR NEGATIVE NEGATIVE Final    Comment: (NOTE)  SARS-CoV-2 target nucleic acids are NOT DETECTED.  The SARS-CoV-2 RNA is generally detectable in upper respiratory specimens during the acute phase of infection. The lowest concentration of SARS-CoV-2 viral copies this assay can detect is 138 copies/mL. A negative result does not preclude  SARS-Cov-2 infection and should not be used as the sole basis for treatment or other patient management decisions. A negative result may occur with  improper specimen collection/handling, submission of specimen other than nasopharyngeal swab, presence of viral mutation(s) within the areas targeted by this assay, and inadequate number of viral copies(<138 copies/mL). A negative result must be combined with clinical observations, patient history, and epidemiological information. The expected result is Negative.  Fact Sheet for Patients:  EntrepreneurPulse.com.au  Fact Sheet for Healthcare Providers:  IncredibleEmployment.be  This test is no t yet approved or cleared by the [place] FDA and  has been authorized for detection and/or diagnosis of SARS-CoV-2 by FDA under an Emergency Use Authorization (EUA). This EUA will remain  in effect (meaning this test can be used) for the duration of the COVID-19 declaration under Section 564(b)(1) of the Act, 21 U.S.C.section 360bbb-3(b)(1), unless the authorization is terminated  or revoked sooner.       Influenza A by PCR NEGATIVE NEGATIVE Final   Influenza B by PCR NEGATIVE NEGATIVE Final    Comment: (NOTE) The Xpert Xpress SARS-CoV-2/FLU/RSV plus assay is intended as an aid in the diagnosis of influenza from Nasopharyngeal swab specimens and should not be used as a sole basis for treatment. Nasal washings and aspirates are unacceptable for Xpert Xpress SARS-CoV-2/FLU/RSV testing.  Fact Sheet for Patients: EntrepreneurPulse.com.au  Fact Sheet for Healthcare Providers: IncredibleEmployment.be  This test is not yet approved or cleared by the [place] FDA and has been authorized for detection and/or diagnosis of SARS-CoV-2 by FDA under an Emergency Use Authorization (EUA). This EUA will remain in effect (meaning this test can be used) for the duration of  the COVID-19 declaration under Section 564(b)(1) of the Act, 21 U.S.C. section 360bbb-3(b)(1), unless the authorization is terminated or revoked.     Resp Syncytial Virus by PCR NEGATIVE NEGATIVE Final    Comment: (NOTE) Fact Sheet for Patients: EntrepreneurPulse.com.au  Fact Sheet for Healthcare Providers: IncredibleEmployment.be  This test is not yet approved or cleared by the [place] FDA and has been authorized for detection and/or diagnosis of SARS-CoV-2 by FDA under an Emergency Use Authorization (EUA). This EUA will remain in effect (meaning this test can be used) for the duration of the COVID-19 declaration under Section 564(b)(1) of the Act, 21 U.S.C. section 360bbb-3(b)(1), unless the authorization is terminated or revoked.  Performed at [facility], [address], [place], [place] [address]   Blood Culture (routine x 2)     Status: None (Preliminary result)   Collection Time: 12/06/22 11:17 AM   Specimen: BLOOD  Result Value Ref Range Status   Specimen Description BLOOD RIGHT ASSIST CONTROL  Final   Special Requests   Final    BOTTLES DRAWN AEROBIC AND ANAEROBIC Blood Culture results may not be optimal due to an excessive volume of blood received in culture bottles   Culture   Final    NO GROWTH 3 DAYS Performed at [facility], [address] [place], [place] [address]    Report Status PENDING  Incomplete  Blood Culture (routine x 2)     Status: None (Preliminary result)   Collection Time: 12/06/22 11:17 AM   Specimen: BLOOD  Result Value Ref  Range Status   Specimen Description   Final    BLOOD BLOOD RIGHT ARM Performed at [facility], [address], [place], [place] [address]    Special Requests   Final    BOTTLES DRAWN AEROBIC AND ANAEROBIC Blood Culture adequate volume Performed at [facility], [address] [place], [place] [address]    Culture  Setup Time   Final    GRAM POSITIVE RODS Gram Stain Report  Called to,Read Back By and Verified With: CATES LAND M ON 12/07/22 AT 2230 BY LOY,C AEROBIC BOTTLE PERFORMED AT [facility]    Culture GRAM POSITIVE RODS  Final   Report Status PENDING  Incomplete  Urine Culture     Status: Abnormal   Collection Time: 12/06/22  2:00 PM   Specimen: Urine, Random  Result Value Ref Range Status   Specimen Description   Final    URINE, RANDOM Performed at [facility], [address] [place], [place] [address]    Special Requests   Final    NONE Reflexed from [id number] Performed at [facility], [address], [place], [place] [address]    Culture MULTIPLE SPECIES PRESENT, SUGGEST RECOLLECTION (A)  Final   Report Status 12/08/2022 FINAL  Final    Radiology Reports No results found.  SIGNED: Deatra James, MD, FHM. FAAFP. Zacarias Pontes - [facility] Time spent > 55 min.  In seeing, evaluating and examining the patient. Reviewing medical records, labs, drawn plan of care. [facility],  Pager (please use [email or web address] to page/ text) Please use Epic Secure Chat for non-urgent communication (7AM-7PM)  If 7PM-7AM, please contact night-coverage [email or web address], 12/09/2022, 10:52 AM

## 2022-12-09 NOTE — TOC Progression Note (Signed)
Transition of Care [facility]) - Progression Note    Patient Details  Name: [name] MRN: [id number] Date of Birth: 12-28-1985  Transition of Care [facility]) CM/SW Contact  Joaquin Courts, RN Phone Number: 12/09/2022, 11:12 AM  Clinical Narrative:    CM spoke with Ritta Slot Rep, who reports no open beds and no LTC beds anticipated to open at this time.   Expected Discharge Plan: [place] Barriers to Discharge: Continued Medical Work up  Expected Discharge Plan and Services                                               Social Determinants of Health (SDOH) Interventions SDOH Screenings   Alcohol Screen: Medium Risk (07/22/2018)  Tobacco Use: High Risk (12/06/2022)    Readmission Risk Interventions    02/15/2022    2:35 PM 08/24/2021    4:00 PM 08/24/2021   11:54 AM  Readmission Risk Prevention Plan  Transportation Screening Complete Complete   PCP or Specialist Appt within 3-5 Days  Complete Complete  HRI or [place]  Complete Complete  Social Work Consult for [place] Planning/Counseling   Complete  Palliative Care Screening  Complete Complete  Medication Review Press photographer) Complete Complete Complete  PCP or Specialist appointment within 3-5 days of discharge Complete    HRI or [place] Complete    SW Recovery Care/Counseling Consult Complete    [place] Not Applicable

## 2022-12-09 NOTE — Evaluation (Signed)
Occupational Therapy Evaluation Patient Details Name: [name] MRN: [id number] DOB: Nov 09, 1985 Today's Date: 12/09/2022   History of Present Illness [name]  is a 37 y.o. male with PMHx relevant for  HIV, Bipolar 1 disorder/Schizoaffective disorder/PTSD/Depression/anxiety , HTN, FTT, Chronic hidradenitis suppurativa involving groin creases, pubic region, buttocks , Anemia of chronic disease, alcoholic liver cirrhosis, GERD and history of orthostatic hypotension in the setting of autonomic dysfunction on chronic decubitus ulcers in the setting of hidradenitis suppurativa presents from home by EMS ??  Altered mentation in the setting of poor oral intake--- he apparently has a home health CNA--- who quit about a week ago show patient does not have sufficient care at home.  -Patient states she has not had oral intake for 2 days  --Apparently patient has been less awake and at times more confused  -He was covered in feces when EMS got there he had a low blood pressure EMS gave him NS bolus of 300 mL x 1  -No fevers no emesis, no BM in about 2 days   Clinical Impression   Pt agreeable to OT evaluation. Per chart review pt lives at home alone and has an aid that assists with daily tasks, although aid has recently quit. At baseline pt has cognitive deficits and requires assistance for functional mobility, bathing, dressing, cooking, and cleaning. Pt was able to complete bed mobility with repeated VC for hand placement on bed rails, mod physical assist to move LE, and demonstrated slow labored movement. Pt was able to participate in sit to stand t/f for 3 trials- he required mod assist and use of RW. Pt was able to wash face with set up while sitting up in bed. Pt would benefit from continued OT services while in acute care setting.       Recommendations for follow up therapy are one component of a multi-disciplinary discharge planning process, led by the attending physician.  Recommendations may be  updated based on patient status, additional functional criteria and insurance authorization.   Follow Up Recommendations  Skilled nursing-short term rehab (<3 hours/day)     Assistance Recommended at Discharge Intermittent Supervision/Assistance  Patient can return home with the following A lot of help with walking and/or transfers;A lot of help with bathing/dressing/bathroom;Assistance with cooking/housework    Functional Status Assessment  Patient has had a recent decline in their functional status and demonstrates the ability to make significant improvements in function in a reasonable and predictable amount of time.  Equipment Recommendations  None recommended by OT    Recommendations for Other Services       Precautions / Restrictions Precautions Precautions: Fall Restrictions Weight Bearing Restrictions: No      Mobility Bed Mobility Overal bed mobility: Needs Assistance Bed Mobility: Supine to Sit, Sit to Supine     Supine to sit: Mod assist, HOB elevated Sit to supine: Mod assist   General bed mobility comments: Pt required repeated VC for hand placement on bed rails, assist to move LE and demonstrated slow labored movement    Transfers Overall transfer level: Needs assistance Equipment used: Rolling walker (2 wheels) Transfers: Sit to/from Stand Sit to Stand: Mod assist, From elevated surface (Sitting EOB wiht bed raised)           General transfer comment: Pt required mod assist and use of raised bed and RW for STS t/f      Balance Overall balance assessment: Needs assistance Sitting-balance support: Feet supported, Bilateral upper extremity supported Sitting balance-Leahy  Scale: Fair Sitting balance - Comments: seated at EOB Postural control: Posterior lean (when fatigued demonstrated posterior lean) Standing balance support: Bilateral upper extremity supported, Reliant on assistive device for balance Standing balance-Leahy Scale: Fair                              ADL either performed or assessed with clinical judgement   ADL Overall ADL's : Needs assistance/impaired;At baseline Eating/Feeding: Set up   Grooming: Wash/dry face;Set up;Bed level Grooming Details (indicate cue type and reason): Pty able to wash and dry face with set up while sittign up in bed Upper Body Bathing: Minimal assistance;Set up;Sitting   Lower Body Bathing: Moderate assistance;Sitting/lateral leans   Upper Body Dressing : Minimal assistance;Sitting   Lower Body Dressing: Maximal assistance (max a to donn socks)   Toilet Transfer: Moderate assistance;Rolling walker (2 wheels);BSC/3in1   Toileting- Clothing Manipulation and Hygiene: Moderate assistance   Tub/ Shower Transfer: Moderate assistance;Rolling walker (2 wheels)   Functional mobility during ADLs: Rolling walker (2 wheels);Moderate assistance (Pt unable to take steps at this time, anticipate mod assist when able)       Vision Baseline Vision/History: 0 No visual deficits Ability to See in Adequate Light: 0 Adequate Patient Visual Report: No change from baseline Vision Assessment?: No apparent visual deficits     Perception Perception Perception Tested?: No         Pertinent Vitals/Pain Pain Assessment Pain Assessment: 0-10 Pain Score: 8  Pain Location: back Pain Descriptors / Indicators: Aching Pain Intervention(s): Limited activity within patient's tolerance, Monitored during session     Hand Dominance Right   Extremity/Trunk Assessment Upper Extremity Assessment Upper Extremity Assessment: Generalized weakness;Overall [facility] for tasks assessed       Cervical / Trunk Assessment Cervical / Trunk Assessment: Normal   Communication Communication Communication: No difficulties   Cognition Arousal/Alertness: Awake/alert Behavior During Therapy: WFL for tasks assessed/performed Overall Cognitive Status: History of cognitive impairments - at baseline                                        General Comments  several posterior bed sores            Home Living Family/patient expects to be discharged to:: Private residence Living Arrangements: Alone Available Help at Discharge: Family;Available PRN/intermittently;Personal care attendant Type of Home: Apartment Home Access: Stairs to enter Entrance Stairs-Number of Steps: 3 Entrance Stairs-Rails: Right Home Layout: One level     Bathroom Shower/Tub: Teacher, early years/pre: Standard Bathroom Accessibility: No   Home Equipment: Conservation officer, nature (2 wheels);Cane - single point;Wheelchair - manual;Hand held shower head;Tub bench;BSC/3in1;Hospital bed   Additional Comments: Pt reports having a lift recliner and WC      Prior Functioning/Environment Prior Level of Function : Needs assist  Cognitive Assist : ADLs (cognitive)   ADLs (Cognitive): Set up cues Physical Assist : Mobility (physical);ADLs (physical) Mobility (physical): Bed mobility;Transfers;Gait;Stairs ADLs (physical): Bathing;Dressing;Toileting;IADLs Mobility Comments: mostly bed bound recently, per recent admission notes was walking with RW and using w/c mostly for mobility ADLs Comments: patient had home aide 7 days/week for 3 hours; aide assists with cleaning, cooking, laundry, bathing and dressing, presently does not have home aide per recent ED notes        OT Problem List: Decreased strength;Decreased activity tolerance;Impaired balance (sitting and/or standing)  OT Treatment/Interventions: Self-care/ADL training;Therapeutic exercise;Therapeutic activities    OT Goals(Current goals can be found in the care plan section) Acute Rehab OT Goals Patient Stated Goal: to go to rehab OT Goal Formulation: With patient Time For Goal Achievement: 12/23/22 Potential to Achieve Goals: Fair  OT Frequency: Min 2X/week                  AM-PAC OT "6 Clicks" Daily Activity     Outcome Measure Help  from another person eating meals?: A Little Help from another person taking care of personal grooming?: A Little Help from another person toileting, which includes using toliet, bedpan, or urinal?: Total Help from another person bathing (including washing, rinsing, drying)?: A Lot Help from another person to put on and taking off regular upper body clothing?: A Little Help from another person to put on and taking off regular lower body clothing?: A Lot 6 Click Score: 14   End of Session    Activity Tolerance: Patient tolerated treatment well Patient left: in bed;with call bell/phone within reach  OT Visit Diagnosis: Muscle weakness (generalized) (M62.81);Unsteadiness on feet (R26.81)                Time: [id number] OT Time Calculation (min): 15 min Charges:  OT General Charges $OT Visit: 1 Visit OT Evaluation $OT Eval Low Complexity: 1 Low  Frederic Jericho, OTR/L  12/09/2022, 9:35 AM

## 2022-12-09 NOTE — TOC Progression Note (Signed)
Transition of Care [facility]) - Progression Note    Patient Details  Name: [name] MRN: [id number] Date of Birth: 1986/10/09  Transition of Care [facility]) CM/SW Contact  Joaquin Courts, RN Phone Number: 12/09/2022, 2:45 PM  Clinical Narrative:     [name] has declined to extend bed offer.  [facility] is still reviewing clinicals.  Expected Discharge Plan: [place] Barriers to Discharge: Continued Medical Work up  Expected Discharge Plan and Services                                               Social Determinants of Health (SDOH) Interventions SDOH Screenings   Alcohol Screen: Medium Risk (07/22/2018)  Tobacco Use: High Risk (12/06/2022)    Readmission Risk Interventions    02/15/2022    2:35 PM 08/24/2021    4:00 PM 08/24/2021   11:54 AM  Readmission Risk Prevention Plan  Transportation Screening Complete Complete   PCP or Specialist Appt within 3-5 Days  Complete Complete  HRI or [place]  Complete Complete  Social Work Consult for [place] Planning/Counseling   Complete  Palliative Care Screening  Complete Complete  Medication Review Press photographer) Complete Complete Complete  PCP or Specialist appointment within 3-5 days of discharge Complete    HRI or [place] Complete    SW Recovery Care/Counseling Consult Complete    [place] Not Applicable

## 2022-12-09 NOTE — Plan of Care (Signed)

## 2022-12-10 DIAGNOSIS — G9341 Metabolic encephalopathy: Secondary | ICD-10-CM | POA: Diagnosis not present

## 2022-12-10 LAB — TYPE AND SCREEN
ABO/RH(D): O POS
Antibody Screen: NEGATIVE
Unit division: 0
Unit division: 0

## 2022-12-10 LAB — CBC
HCT: 29.6 % — ABNORMAL LOW (ref 39.0–52.0)
Hemoglobin: 9.1 g/dL — ABNORMAL LOW (ref 13.0–17.0)
MCH: 28.3 pg (ref 26.0–34.0)
MCHC: 30.7 g/dL (ref 30.0–36.0)
MCV: 91.9 fL (ref 80.0–100.0)
Platelets: 447 10*3/uL — ABNORMAL HIGH (ref 150–400)
RBC: 3.22 MIL/uL — ABNORMAL LOW (ref 4.22–5.81)
RDW: 15.1 % (ref 11.5–15.5)
WBC: 12.1 10*3/uL — ABNORMAL HIGH (ref 4.0–10.5)
nRBC: 0 % (ref 0.0–0.2)

## 2022-12-10 LAB — BASIC METABOLIC PANEL
Anion gap: 8 (ref 5–15)
BUN: 13 mg/dL (ref 6–20)
CO2: 23 mmol/L (ref 22–32)
Calcium: 8.3 mg/dL — ABNORMAL LOW (ref 8.9–10.3)
Chloride: 104 mmol/L (ref 98–111)
Creatinine, Ser: 0.84 mg/dL (ref 0.61–1.24)
GFR, Estimated: >60 mL/min (ref >60)
Glucose, Bld: 78 mg/dL (ref 70–99)
Potassium: 4 mmol/L (ref 3.5–5.1)
Sodium: 135 mmol/L (ref 135–145)

## 2022-12-10 LAB — BPAM RBC
Blood Product Expiration Date: 202404082359
Blood Product Expiration Date: 202404082359
ISSUE DATE / TIME: 202403011054
ISSUE DATE / TIME: 202403011338
Unit Type and Rh: 5100
Unit Type and Rh: 5100

## 2022-12-10 MED ORDER — ONDANSETRON 4 MG PO TBDP
4.0000 mg | ORAL_TABLET | Freq: Three times a day (TID) | ORAL | Status: DC | PRN
  Administered 2022-12-10: 4 mg via ORAL
  Filled 2022-12-10: qty 1

## 2022-12-10 NOTE — Progress Notes (Signed)
PROGRESS NOTE    Patient: [name]                            PCP: [name], MD                    DOB: Nov 08, 1985            DOA: 12/06/2022 [id number]             DOS: 12/10/2022, 11:27 AM   LOS: 4 days   Date of Service: The patient was seen and examined on 12/10/2022  Subjective:   The patient was seen and examined this morning, awake alert oriented no acute distress Multiple attempts with the nursing staff, patient unable to have an unsteady gait unable to take steps or ambulate  Status post 2 unit PRBC transfusion on 12/10/2022 Noted for drop in H&H from 7.7--->7.0>>> 6.5 >>> 9.1 No reported bleeding by patient or nursing staff   Brief Narrative:   [name] is  a 37 year old male apparently bedbound from home with significant history of HIV,  , HTN, FTT, Chronic hidradenitis suppurativa involving groin creases, pubic region, buttocks , Anemia of chronic disease, alcoholic liver cirrhosis, GERD and history of orthostatic hypotension in the setting of autonomic dysfunction on chronic decubitus ulcers in the setting of hidradenitis suppurativa presents from home by EMS ??  Altered mentation in the setting of poor oral intake .   Assessment & Plan:   Principal Problem:   Acute metabolic encephalopathy Active Problems:   UTI (urinary tract infection)   Protein-calorie malnutrition, severe (HCC)   GERD (gastroesophageal reflux disease)   HIV disease (HCC)   Anemia   Avascular necrosis of femoral head, left (HCC)   FTT (failure to thrive) in adult   Alcoholic cirrhosis of liver (HCC)   Bipolar disorder (HCC)   Acute metabolic encephalopathy- --much improved mentation, at baseline  -Altered mental status likely related to: UTI, proctitis, dehydration -Multiple hospitalization, -Vital stable -COVID,  flu and RSV negative -Hydrate and treat infection    Acute on chronic anemia - Anemia of chronic disease -No active signs of bleeding -Likely  multifactorial-exacerbated by proctitis, chronic groin wounds, hemodilution    Latest Ref Rng & Units 12/10/2022    4:10 AM 12/09/2022    4:43 AM 12/08/2022    4:51 AM  CBC  WBC 4.0 - 10.5 K/uL 12.1  9.7  9.7   Hemoglobin 13.0 - 17.0 g/dL 9.1  6.5  7.0   Hematocrit 39.0 - 52.0 % 29.6  21.9  23.8   Platelets 150 - 400 K/uL 447  425  403    -Transfusing 2U PRBC 12/10/2022 a -Obtaining iron studies, Hemoccult, B12, folate -Discontinue heparin for DVT prophylaxis  proctitis--- patient received cefepime and Vanco in the ED --CT abdomen pelvis with possible proctitis - WBC is up to 14.4 >>>9.7 >>12.1  -  Lactic acid is 2.5 Treat empirically with Rocephin and Flagyl   UTI-- +ve Dysuria, --UA suggestive of UTI -WBC is up to 14.4, 12.1  - Lactic acid is 2.5 >> 1.8  - IV Rocephin as above pending further culture data   HIV, on HAART Cont home meds.  Biktarvy -Patient sees infectious disease at [facility]    Failure to thrive in adult -Bedbound, -Multiple attempts to ambulate patient unable to hold steady gait or take steps -Give Ensure nutritional supplements Liberalize diet.  On folic acid, multivitamin,  thiamine   Bipolar 1 disorder/Schizoaffective disorder/PTSD/Depression/anxiety-- Resume home regimen-including Restoril, trazodone, Lexapro and Latuda -Xanax as ordered -History of noncompliance with medications   Orthostatic hypotension/autonomic dysfunction -Patient is bedbound -- Increased Midodrine to 15 mg p.o. 3 times daily AC   GERD -Pepcid as ordered   Chronic hidradenitis suppurativa involving groin creases, pubic region, buttocks --see photos in epic -Decubitus ulcers-noted they do not look infected at this time -Some of the wounds are deep and tracking   Status is: Inpatient   Remains inpatient appropriate because:    Dispo: The patient is from: Home              Anticipated d/c is to: SNF -patient is on difficulty discharge list              Anticipated d/c date is:  2 days              Patient currently is not medically stable to d/c. Patient remains on difficulty discharge list    Barriers: Not Clinically Stable-also difficult to discharge, mom unable to take care of him at home, he is now bedbound, needing assist with all ADLs   ------------------------------------------------------------------------------------------------------------------------- Nutritional status:  The patient's BMI is: Body mass index is 18.46 kg/m. I agree with the assessment and plan as outlined below: Nutrition Status: Nutrition Problem: Severe Malnutrition Etiology: chronic illness, catabolic illness (cirrhosis, HIV, numerous chronic pressure injuries) Signs/Symptoms: energy intake < or equal to 75% for > or equal to 1 month, percent weight loss Percent weight loss: 17 % (x 3 months and 39 % loss x 27 months) Interventions: Boost Plus, Liberalize Diet, Education, Prostat, Juven (vitamin c and thiamine daily)     Skin Assessment: I have examined the patient's skin and I agree with the wound assessment as performed by wound care team As outlined belowe: Pressure Injury 08/27/22 Buttocks Bilateral Stage 2 -  Partial thickness loss of dermis presenting as a shallow open injury with a red, pink wound bed without slough. (Active)  08/27/22   Location: Buttocks  Location Orientation: Bilateral  Staging: Stage 2 -  Partial thickness loss of dermis presenting as a shallow open injury with a red, pink wound bed without slough.  Wound Description (Comments):   Present on Admission: Yes     Pressure Injury 12/06/22 Buttocks Right;Distal Stage 3 -  Full thickness tissue loss. Subcutaneous fat may be visible but bone, tendon or muscle are NOT exposed. (Active)  12/06/22 1826  Location: Buttocks  Location Orientation: Right;Distal  Staging: Stage 3 -  Full thickness tissue loss. Subcutaneous fat may be visible but bone, tendon or muscle are NOT exposed.  Wound Description  (Comments):   Present on Admission: Yes  Dressing Type Foam - Lift dressing to assess site every shift;Other (Comment) (aquacel) 12/09/22 0246     Pressure Injury 12/06/22 Thigh Right;Upper Stage 3 -  Full thickness tissue loss. Subcutaneous fat may be visible but bone, tendon or muscle are NOT exposed. (Active)  12/06/22 1700  Location: Thigh  Location Orientation: Right;Upper  Staging: Stage 3 -  Full thickness tissue loss. Subcutaneous fat may be visible but bone, tendon or muscle are NOT exposed.  Wound Description (Comments):   Present on Admission: Yes  Dressing Type Foam - Lift dressing to assess site every shift;Other (Comment) (aquacell) 12/09/22 0246     Pressure Injury 12/06/22 Hip Right Stage 2 -  Partial thickness loss of dermis presenting as a shallow open injury with a red, pink  wound bed without slough. (Active)  12/06/22 1700  Location: Hip  Location Orientation: Right  Staging: Stage 2 -  Partial thickness loss of dermis presenting as a shallow open injury with a red, pink wound bed without slough.  Wound Description (Comments):   Present on Admission: Yes  Dressing Type Foam - Lift dressing to assess site every shift;Other (Comment) (aquacell) 12/09/22 0246     Pressure Injury 12/06/22 Sacrum Right Stage 3 -  Full thickness tissue loss. Subcutaneous fat may be visible but bone, tendon or muscle are NOT exposed. (Active)  12/06/22 1700  Location: Sacrum  Location Orientation: Right  Staging: Stage 3 -  Full thickness tissue loss. Subcutaneous fat may be visible but bone, tendon or muscle are NOT exposed.  Wound Description (Comments):   Present on Admission: Yes  Dressing Type Foam - Lift dressing to assess site every shift 12/09/22 0246     Pressure Injury 12/06/22 Thigh Left;Medial Stage 2 -  Partial thickness loss of dermis presenting as a shallow open injury with a red, pink wound bed without slough. (Active)  12/06/22 1833  Location: Thigh  Location  Orientation: Left;Medial  Staging: Stage 2 -  Partial thickness loss of dermis presenting as a shallow open injury with a red, pink wound bed without slough.  Wound Description (Comments):   Present on Admission: Yes  Dressing Type Foam - Lift dressing to assess site every shift 12/09/22 0246     Pressure Injury 12/06/22 Thigh Left;Mid;Upper Stage 3 -  Full thickness tissue loss. Subcutaneous fat may be visible but bone, tendon or muscle are NOT exposed. (Active)  12/06/22 1700  Location: Thigh  Location Orientation: Left;Mid;Upper  Staging: Stage 3 -  Full thickness tissue loss. Subcutaneous fat may be visible but bone, tendon or muscle are NOT exposed.  Wound Description (Comments):   Present on Admission: Yes  Dressing Type None 12/09/22 1818     Pressure Injury 12/06/22 Knee Left Unstageable - Full thickness tissue loss in which the base of the injury is covered by slough (yellow, tan, gray, green or brown) and/or eschar (tan, brown or black) in the wound bed. (Active)  12/06/22 1700  Location: Knee  Location Orientation: Left  Staging: Unstageable - Full thickness tissue loss in which the base of the injury is covered by slough (yellow, tan, gray, green or brown) and/or eschar (tan, brown or black) in the wound bed.  Wound Description (Comments):   Present on Admission: Yes  Dressing Type Foam - Lift dressing to assess site every shift 12/09/22 1813     Pressure Injury 12/06/22 Heel Left;Lateral Deep Tissue Pressure Injury - Purple or maroon localized area of discolored intact skin or blood-filled blister due to damage of underlying soft tissue from pressure and/or shear. (Active)  12/06/22 1700  Location: Heel  Location Orientation: Left;Lateral  Staging: Deep Tissue Pressure Injury - Purple or maroon localized area of discolored intact skin or blood-filled blister due to damage of underlying soft tissue from pressure and/or shear.  Wound Description (Comments):   Present on  Admission: Yes  Dressing Type Other (Comment) (Mepitel heel) 12/09/22 1804     Pressure Injury 12/06/22 Heel Left;Proximal Deep Tissue Pressure Injury - Purple or maroon localized area of discolored intact skin or blood-filled blister due to damage of underlying soft tissue from pressure and/or shear. (Active)  12/06/22 1700  Location: Heel  Location Orientation: Left;Proximal  Staging: Deep Tissue Pressure Injury - Purple or maroon localized area of discolored intact  skin or blood-filled blister due to damage of underlying soft tissue from pressure and/or shear.  Wound Description (Comments):   Present on Admission: Yes  Dressing Type Foam - Lift dressing to assess site every shift 12/08/22 2109     Pressure Injury 12/06/22 Foot Left;Posterior;Upper Deep Tissue Pressure Injury - Purple or maroon localized area of discolored intact skin or blood-filled blister due to damage of underlying soft tissue from pressure and/or shear. (Active)  12/06/22 1842  Location: Foot  Location Orientation: Left;Posterior;Upper  Staging: Deep Tissue Pressure Injury - Purple or maroon localized area of discolored intact skin or blood-filled blister due to damage of underlying soft tissue from pressure and/or shear.  Wound Description (Comments):   Present on Admission: Yes  Dressing Type Foam - Lift dressing to assess site every shift 12/08/22 2109     Pressure Injury 12/06/22 Heel Right Deep Tissue Pressure Injury - Purple or maroon localized area of discolored intact skin or blood-filled blister due to damage of underlying soft tissue from pressure and/or shear. (Active)  12/06/22 1700  Location: Heel  Location Orientation: Right  Staging: Deep Tissue Pressure Injury - Purple or maroon localized area of discolored intact skin or blood-filled blister due to damage of underlying soft tissue from pressure and/or shear.  Wound Description (Comments):   Present on Admission: Yes  Dressing Type Other (Comment)  (Mepitel Heel) 12/09/22 1808     Pressure Injury 12/06/22 Foot Left;Lateral Deep Tissue Pressure Injury - Purple or maroon localized area of discolored intact skin or blood-filled blister due to damage of underlying soft tissue from pressure and/or shear. (Active)  12/06/22 1700  Location: Foot  Location Orientation: Left;Lateral  Staging: Deep Tissue Pressure Injury - Purple or maroon localized area of discolored intact skin or blood-filled blister due to damage of underlying soft tissue from pressure and/or shear.  Wound Description (Comments):   Present on Admission: Yes  Dressing Type Other (Comment) (Mepital heel) 12/09/22 1810     Pressure Injury 12/06/22 Foot Left;Posterior;Upper Deep Tissue Pressure Injury - Purple or maroon localized area of discolored intact skin or blood-filled blister due to damage of underlying soft tissue from pressure and/or shear. (Active)  12/06/22 1700  Location: Foot  Location Orientation: Left;Posterior;Upper  Staging: Deep Tissue Pressure Injury - Purple or maroon localized area of discolored intact skin or blood-filled blister due to damage of underlying soft tissue from pressure and/or shear.  Wound Description (Comments):   Present on Admission: Yes  Dressing Type Foam - Lift dressing to assess site every shift 12/08/22 2109     ----------------------------------------------------------------------------------------------------------------------- Cultures; Blood Cultures x 2 >> NGT -------------------------------------------------------------------------------------------------------------------------  DVT prophylaxis:  Place and maintain sequential compression device Start: 12/08/22 1215 SCDs Start: 12/06/22 1630   Code Status:   Code Status: Full Code  Family Communication: Attempted to call his mother Mrs. Holly care planning has been discussed.   Admission status:   Status is: Inpatient Remains inpatient  appropriate because: Eating IV fluids, IV antibiotics,   Disposition: From  - home             Planning for discharge in 1-2 days: to SNF   Procedures:   No admission procedures for hospital encounter.   Antimicrobials:  Anti-infectives (From admission, onward)    Start     Dose/Rate Route Frequency Ordered Stop   12/08/22 2200  metroNIDAZOLE (FLAGYL) tablet 500 mg        500 mg Oral Every 8 hours 12/08/22 1212  12/07/22 0200  vancomycin (VANCOREADY) IVPB 750 mg/150 mL        750 mg 150 mL/hr over 60 Minutes Intravenous Every 12 hours 12/06/22 1409 12/07/22 0215   12/06/22 2200  cefTRIAXone (ROCEPHIN) 1 g in sodium chloride 0.9 % 100 mL IVPB        1 g 200 mL/hr over 30 Minutes Intravenous Every 24 hours 12/06/22 1844     12/06/22 2200  metroNIDAZOLE (FLAGYL) IVPB 500 mg  Status:  Discontinued        500 mg 100 mL/hr over 60 Minutes Intravenous Every 12 hours 12/06/22 1845 12/08/22 1212   12/06/22 2100  ceFEPIme (MAXIPIME) 2 g in sodium chloride 0.9 % 100 mL IVPB  Status:  Discontinued        2 g 200 mL/hr over 30 Minutes Intravenous Every 8 hours 12/06/22 1314 12/06/22 1844   12/06/22 1800  bictegravir-emtricitabine-tenofovir AF (BIKTARVY) 50-200-25 MG per tablet 1 tablet        1 tablet Oral Daily 12/06/22 1633     12/06/22 1800  valACYclovir (VALTREX) tablet 1,000 mg        1,000 mg Oral Daily 12/06/22 1633     12/06/22 1300  vancomycin (VANCOREADY) IVPB 1500 mg/300 mL        1,500 mg 150 mL/hr over 120 Minutes Intravenous  Once 12/06/22 1245 12/06/22 1555   12/06/22 1230  ceFEPIme (MAXIPIME) 2 g in sodium chloride 0.9 % 100 mL IVPB        2 g 200 mL/hr over 30 Minutes Intravenous  Once 12/06/22 1229 12/06/22 1315   12/06/22 1230  metroNIDAZOLE (FLAGYL) IVPB 500 mg        500 mg 100 mL/hr over 60 Minutes Intravenous  Once 12/06/22 1229 12/06/22 1505   12/06/22 1230  vancomycin (VANCOCIN) IVPB 1000 mg/200 mL premix  Status:  Discontinued        1,000 mg 200 mL/hr over  60 Minutes Intravenous  Once 12/06/22 1229 12/06/22 1245        Medication:   (feeding supplement) PROSource Plus  30 mL Oral BID BM   ascorbic acid  500 mg Oral Daily   bictegravir-emtricitabine-tenofovir AF  1 tablet Oral Daily   escitalopram  20 mg Oral Daily   feeding supplement  237 mL Oral BID BM   gabapentin  100 mg Oral BID   lactobacillus  1 g Oral TID   lurasidone  120 mg Oral Daily   metroNIDAZOLE  500 mg Oral Q8H   midodrine  10 mg Oral TID WC   multivitamin with minerals  1 tablet Oral Daily   nicotine  7 mg Transdermal Daily   nutrition supplement (JUVEN)  1 packet Oral BID BM   senna-docusate  2 tablet Oral QHS   sodium chloride flush  3 mL Intravenous Q12H   temazepam  30 mg Oral QHS   thiamine  100 mg Oral Daily   trazodone  200 mg Oral QHS   valACYclovir  1,000 mg Oral Daily    acetaminophen **OR** acetaminophen, ALPRAZolam, bisacodyl, cyclobenzaprine, diphenhydrAMINE, diphenoxylate-atropine, HYDROcodone-acetaminophen, polyethylene glycol   Objective:   Vitals:   12/09/22 1542 12/09/22 1724 12/09/22 2115 12/10/22 0515  BP: 105/75 110/77 115/81 112/75  Pulse: (!) 110 (!) 101 94 (!) 110  Resp: '18 16 20 16  '$ Temp: 98.5 F (36.9 C) 98.4 F (36.9 C) 98.1 F (36.7 C) 98.6 F (37 C)  TempSrc: Oral Oral    SpO2: 100% 100% 100% 100%  Weight:      Height:        Intake/Output Summary (Last 24 hours) at 12/10/2022 1127 Last data filed at 12/10/2022 0900 Gross per 24 hour  Intake 2398.34 ml  Output 3400 ml  Net -1001.66 ml   Filed Weights   12/06/22 1010  Weight: 67 kg     Physical examination:         General:  AAO x 3,  cooperative, no distress;   HEENT:  Normocephalic, PERRL, otherwise with in Normal limits   Neuro:  CNII-XII intact. , normal motor and sensation, reflexes intact   Lungs:   Clear to auscultation BL, Respirations unlabored,  No wheezes / crackles  Cardio:    S1/S2, RRR, No murmure, No Rubs or Gallops   Abdomen:  Soft,  non-tender, bowel sounds active all four quadrants, no guarding or peritoneal signs.  Muscular  skeletal:  Limited exam -SEVER global generalized weaknesses Unsteady gait, unable to ambulate - in bed, able to move all 4 extremities,   2+ pulses,  symmetric, No pitting edema  Skin:  Dry, warm to touch, negative for any Rashes,  Wounds: Please see nursing documentation  Pressure Injury 08/27/22 Buttocks Bilateral Stage 2 -  Partial thickness loss of dermis presenting as a shallow open injury with a red, pink wound bed without slough. (Active)  08/27/22   Location: Buttocks  Location Orientation: Bilateral  Staging: Stage 2 -  Partial thickness loss of dermis presenting as a shallow open injury with a red, pink wound bed without slough.  Wound Description (Comments):   Present on Admission: Yes     Pressure Injury 12/06/22 Buttocks Right;Distal Stage 3 -  Full thickness tissue loss. Subcutaneous fat may be visible but bone, tendon or muscle are NOT exposed. (Active)  12/06/22 1826  Location: Buttocks  Location Orientation: Right;Distal  Staging: Stage 3 -  Full thickness tissue loss. Subcutaneous fat may be visible but bone, tendon or muscle are NOT exposed.  Wound Description (Comments):   Present on Admission: Yes  Dressing Type Foam - Lift dressing to assess site every shift;Other (Comment) (aquacel) 12/09/22 0246     Pressure Injury 12/06/22 Thigh Right;Upper Stage 3 -  Full thickness tissue loss. Subcutaneous fat may be visible but bone, tendon or muscle are NOT exposed. (Active)  12/06/22 1700  Location: Thigh  Location Orientation: Right;Upper  Staging: Stage 3 -  Full thickness tissue loss. Subcutaneous fat may be visible but bone, tendon or muscle are NOT exposed.  Wound Description (Comments):   Present on Admission: Yes  Dressing Type Foam - Lift dressing to assess site every shift;Other (Comment) (aquacell) 12/09/22 0246     Pressure Injury 12/06/22 Hip Right Stage 2 -   Partial thickness loss of dermis presenting as a shallow open injury with a red, pink wound bed without slough. (Active)  12/06/22 1700  Location: Hip  Location Orientation: Right  Staging: Stage 2 -  Partial thickness loss of dermis presenting as a shallow open injury with a red, pink wound bed without slough.  Wound Description (Comments):   Present on Admission: Yes  Dressing Type Foam - Lift dressing to assess site every shift;Other (Comment) (aquacell) 12/09/22 0246     Pressure Injury 12/06/22 Sacrum Right Stage 3 -  Full thickness tissue loss. Subcutaneous fat may be visible but bone, tendon or muscle are NOT exposed. (Active)  12/06/22 1700  Location: Sacrum  Location Orientation: Right  Staging: Stage 3 -  Full thickness  tissue loss. Subcutaneous fat may be visible but bone, tendon or muscle are NOT exposed.  Wound Description (Comments):   Present on Admission: Yes  Dressing Type Foam - Lift dressing to assess site every shift 12/09/22 0246     Pressure Injury 12/06/22 Thigh Left;Medial Stage 2 -  Partial thickness loss of dermis presenting as a shallow open injury with a red, pink wound bed without slough. (Active)  12/06/22 1833  Location: Thigh  Location Orientation: Left;Medial  Staging: Stage 2 -  Partial thickness loss of dermis presenting as a shallow open injury with a red, pink wound bed without slough.  Wound Description (Comments):   Present on Admission: Yes  Dressing Type Foam - Lift dressing to assess site every shift 12/09/22 0246     Pressure Injury 12/06/22 Thigh Left;Mid;Upper Stage 3 -  Full thickness tissue loss. Subcutaneous fat may be visible but bone, tendon or muscle are NOT exposed. (Active)  12/06/22 1700  Location: Thigh  Location Orientation: Left;Mid;Upper  Staging: Stage 3 -  Full thickness tissue loss. Subcutaneous fat may be visible but bone, tendon or muscle are NOT exposed.  Wound Description (Comments):   Present on Admission: Yes   Dressing Type None 12/09/22 1818     Pressure Injury 12/06/22 Knee Left Unstageable - Full thickness tissue loss in which the base of the injury is covered by slough (yellow, tan, gray, green or brown) and/or eschar (tan, brown or black) in the wound bed. (Active)  12/06/22 1700  Location: Knee  Location Orientation: Left  Staging: Unstageable - Full thickness tissue loss in which the base of the injury is covered by slough (yellow, tan, gray, green or brown) and/or eschar (tan, brown or black) in the wound bed.  Wound Description (Comments):   Present on Admission: Yes  Dressing Type Foam - Lift dressing to assess site every shift 12/09/22 1813     Pressure Injury 12/06/22 Heel Left;Lateral Deep Tissue Pressure Injury - Purple or maroon localized area of discolored intact skin or blood-filled blister due to damage of underlying soft tissue from pressure and/or shear. (Active)  12/06/22 1700  Location: Heel  Location Orientation: Left;Lateral  Staging: Deep Tissue Pressure Injury - Purple or maroon localized area of discolored intact skin or blood-filled blister due to damage of underlying soft tissue from pressure and/or shear.  Wound Description (Comments):   Present on Admission: Yes  Dressing Type Other (Comment) (Mepitel heel) 12/09/22 1804     Pressure Injury 12/06/22 Heel Left;Proximal Deep Tissue Pressure Injury - Purple or maroon localized area of discolored intact skin or blood-filled blister due to damage of underlying soft tissue from pressure and/or shear. (Active)  12/06/22 1700  Location: Heel  Location Orientation: Left;Proximal  Staging: Deep Tissue Pressure Injury - Purple or maroon localized area of discolored intact skin or blood-filled blister due to damage of underlying soft tissue from pressure and/or shear.  Wound Description (Comments):   Present on Admission: Yes  Dressing Type Foam - Lift dressing to assess site every shift 12/08/22 2109     Pressure Injury  12/06/22 Foot Left;Posterior;Upper Deep Tissue Pressure Injury - Purple or maroon localized area of discolored intact skin or blood-filled blister due to damage of underlying soft tissue from pressure and/or shear. (Active)  12/06/22 1842  Location: Foot  Location Orientation: Left;Posterior;Upper  Staging: Deep Tissue Pressure Injury - Purple or maroon localized area of discolored intact skin or blood-filled blister due to damage of underlying soft tissue from pressure  and/or shear.  Wound Description (Comments):   Present on Admission: Yes  Dressing Type Foam - Lift dressing to assess site every shift 12/08/22 2109     Pressure Injury 12/06/22 Heel Right Deep Tissue Pressure Injury - Purple or maroon localized area of discolored intact skin or blood-filled blister due to damage of underlying soft tissue from pressure and/or shear. (Active)  12/06/22 1700  Location: Heel  Location Orientation: Right  Staging: Deep Tissue Pressure Injury - Purple or maroon localized area of discolored intact skin or blood-filled blister due to damage of underlying soft tissue from pressure and/or shear.  Wound Description (Comments):   Present on Admission: Yes  Dressing Type Other (Comment) (Mepitel Heel) 12/09/22 1808     Pressure Injury 12/06/22 Foot Left;Lateral Deep Tissue Pressure Injury - Purple or maroon localized area of discolored intact skin or blood-filled blister due to damage of underlying soft tissue from pressure and/or shear. (Active)  12/06/22 1700  Location: Foot  Location Orientation: Left;Lateral  Staging: Deep Tissue Pressure Injury - Purple or maroon localized area of discolored intact skin or blood-filled blister due to damage of underlying soft tissue from pressure and/or shear.  Wound Description (Comments):   Present on Admission: Yes  Dressing Type Other (Comment) (Mepital heel) 12/09/22 1810     Pressure Injury 12/06/22 Foot Left;Posterior;Upper Deep Tissue Pressure Injury -  Purple or maroon localized area of discolored intact skin or blood-filled blister due to damage of underlying soft tissue from pressure and/or shear. (Active)  12/06/22 1700  Location: Foot  Location Orientation: Left;Posterior;Upper  Staging: Deep Tissue Pressure Injury - Purple or maroon localized area of discolored intact skin or blood-filled blister due to damage of underlying soft tissue from pressure and/or shear.  Wound Description (Comments):   Present on Admission: Yes  Dressing Type Foam - Lift dressing to assess site every shift 12/08/22 2109        ------------------------------------------------------------------------------------------------------------------------------    LABs:     Latest Ref Rng & Units 12/10/2022    4:10 AM 12/09/2022    4:43 AM 12/08/2022    4:51 AM  CBC  WBC 4.0 - 10.5 K/uL 12.1  9.7  9.7   Hemoglobin 13.0 - 17.0 g/dL 9.1  6.5  7.0   Hematocrit 39.0 - 52.0 % 29.6  21.9  23.8   Platelets 150 - 400 K/uL 447  425  403       Latest Ref Rng & Units 12/10/2022    4:10 AM 12/09/2022    4:43 AM 12/08/2022    4:51 AM  CMP  Glucose 70 - 99 mg/dL 78  85  115   BUN 6 - 20 mg/dL '13  12  8   '$ Creatinine 0.61 - 1.24 mg/dL 0.84  0.79  0.88   Sodium 135 - 145 mmol/L 135  136  135   Potassium 3.5 - 5.1 mmol/L 4.0  3.3  2.8   Chloride 98 - 111 mmol/L 104  103  105   CO2 22 - 32 mmol/L '23  28  24   '$ Calcium 8.9 - 10.3 mg/dL 8.3  8.0  7.7        Micro Results Recent Results (from the past 240 hour(s))  Resp panel by RT-PCR (RSV, Flu A&B, Covid) Anterior Nasal Swab     Status: None   Collection Time: 12/06/22 10:56 AM   Specimen: Anterior Nasal Swab  Result Value Ref Range Status   SARS Coronavirus 2 by RT PCR NEGATIVE NEGATIVE  Final    Comment: (NOTE) SARS-CoV-2 target nucleic acids are NOT DETECTED.  The SARS-CoV-2 RNA is generally detectable in upper respiratory specimens during the acute phase of infection. The lowest concentration of SARS-CoV-2 viral  copies this assay can detect is 138 copies/mL. A negative result does not preclude SARS-Cov-2 infection and should not be used as the sole basis for treatment or other patient management decisions. A negative result may occur with  improper specimen collection/handling, submission of specimen other than nasopharyngeal swab, presence of viral mutation(s) within the areas targeted by this assay, and inadequate number of viral copies(<138 copies/mL). A negative result must be combined with clinical observations, patient history, and epidemiological information. The expected result is Negative.  Fact Sheet for Patients:  EntrepreneurPulse.com.au  Fact Sheet for Healthcare Providers:  IncredibleEmployment.be  This test is no t yet approved or cleared by the [place] FDA and  has been authorized for detection and/or diagnosis of SARS-CoV-2 by FDA under an Emergency Use Authorization (EUA). This EUA will remain  in effect (meaning this test can be used) for the duration of the COVID-19 declaration under Section 564(b)(1) of the Act, 21 U.S.C.section 360bbb-3(b)(1), unless the authorization is terminated  or revoked sooner.       Influenza A by PCR NEGATIVE NEGATIVE Final   Influenza B by PCR NEGATIVE NEGATIVE Final    Comment: (NOTE) The Xpert Xpress SARS-CoV-2/FLU/RSV plus assay is intended as an aid in the diagnosis of influenza from Nasopharyngeal swab specimens and should not be used as a sole basis for treatment. Nasal washings and aspirates are unacceptable for Xpert Xpress SARS-CoV-2/FLU/RSV testing.  Fact Sheet for Patients: EntrepreneurPulse.com.au  Fact Sheet for Healthcare Providers: IncredibleEmployment.be  This test is not yet approved or cleared by the [place] FDA and has been authorized for detection and/or diagnosis of SARS-CoV-2 by FDA under an Emergency Use Authorization (EUA). This  EUA will remain in effect (meaning this test can be used) for the duration of the COVID-19 declaration under Section 564(b)(1) of the Act, 21 U.S.C. section 360bbb-3(b)(1), unless the authorization is terminated or revoked.     Resp Syncytial Virus by PCR NEGATIVE NEGATIVE Final    Comment: (NOTE) Fact Sheet for Patients: EntrepreneurPulse.com.au  Fact Sheet for Healthcare Providers: IncredibleEmployment.be  This test is not yet approved or cleared by the [place] FDA and has been authorized for detection and/or diagnosis of SARS-CoV-2 by FDA under an Emergency Use Authorization (EUA). This EUA will remain in effect (meaning this test can be used) for the duration of the COVID-19 declaration under Section 564(b)(1) of the Act, 21 U.S.C. section 360bbb-3(b)(1), unless the authorization is terminated or revoked.  Performed at [facility], [address] [place], [place] [address]   Blood Culture (routine x 2)     Status: None (Preliminary result)   Collection Time: 12/06/22 11:17 AM   Specimen: BLOOD  Result Value Ref Range Status   Specimen Description BLOOD RIGHT ASSIST CONTROL  Final   Special Requests   Final    BOTTLES DRAWN AEROBIC AND ANAEROBIC Blood Culture results may not be optimal due to an excessive volume of blood received in culture bottles   Culture   Final    NO GROWTH 4 DAYS Performed at [name], [address] [place], [place] [address]    Report Status PENDING  Incomplete  Blood Culture (routine x 2)     Status: None (Preliminary result)   Collection Time: 12/06/22 11:17 AM  Specimen: BLOOD  Result Value Ref Range Status   Specimen Description   Final    BLOOD BLOOD RIGHT ARM Performed at [facility], [address] [place], [place] [address]    Special Requests   Final    BOTTLES DRAWN AEROBIC AND ANAEROBIC Blood Culture adequate volume Performed at [facility], [address] [place], [place]  [address]    Culture  Setup Time   Final    GRAM POSITIVE RODS Gram Stain Report Called to,Read Back By and Verified With: CATES LAND M ON 12/07/22 AT 2230 BY LOY,C AEROBIC BOTTLE PERFORMED AT [facility]    Culture   Final    GRAM POSITIVE RODS IDENTIFICATION TO FOLLOW Performed at [facility], [place] [address], [place], [place] [address]    Report Status PENDING  Incomplete  Urine Culture     Status: Abnormal   Collection Time: 12/06/22  2:00 PM   Specimen: Urine, Random  Result Value Ref Range Status   Specimen Description   Final    URINE, RANDOM Performed at [facility], [address], [place], [place] [address]    Special Requests   Final    NONE Reflexed from [id number] Performed at [facility], [address], [place], [place] [address]    Culture MULTIPLE SPECIES PRESENT, SUGGEST RECOLLECTION (A)  Final   Report Status 12/08/2022 FINAL  Final    Radiology Reports No results found.  SIGNED: Deatra James, MD, FHM. FAAFP. Zacarias Pontes - [facility] Time spent > 55 min.  In seeing, evaluating and examining the patient. Reviewing medical records, labs, drawn plan of care. [facility],  Pager (please use [email or web address] to page/ text) Please use Epic Secure Chat for non-urgent communication (7AM-7PM)  If 7PM-7AM, please contact night-coverage [email or web address], 12/10/2022, 11:27 AM

## 2022-12-11 DIAGNOSIS — G9341 Metabolic encephalopathy: Secondary | ICD-10-CM | POA: Diagnosis not present

## 2022-12-11 LAB — CBC
HCT: 31.2 % — ABNORMAL LOW (ref 39.0–52.0)
Hemoglobin: 9.6 g/dL — ABNORMAL LOW (ref 13.0–17.0)
MCH: 28.4 pg (ref 26.0–34.0)
MCHC: 30.8 g/dL (ref 30.0–36.0)
MCV: 92.3 fL (ref 80.0–100.0)
Platelets: 446 10*3/uL — ABNORMAL HIGH (ref 150–400)
RBC: 3.38 MIL/uL — ABNORMAL LOW (ref 4.22–5.81)
RDW: 15.2 % (ref 11.5–15.5)
WBC: 13.7 10*3/uL — ABNORMAL HIGH (ref 4.0–10.5)
nRBC: 0 % (ref 0.0–0.2)

## 2022-12-11 LAB — CULTURE, BLOOD (ROUTINE X 2): Culture: NO GROWTH

## 2022-12-11 LAB — BASIC METABOLIC PANEL
Anion gap: 7 (ref 5–15)
BUN: 21 mg/dL — ABNORMAL HIGH (ref 6–20)
CO2: 27 mmol/L (ref 22–32)
Calcium: 8.7 mg/dL — ABNORMAL LOW (ref 8.9–10.3)
Chloride: 100 mmol/L (ref 98–111)
Creatinine, Ser: 0.89 mg/dL (ref 0.61–1.24)
GFR, Estimated: >60 mL/min (ref >60)
Glucose, Bld: 78 mg/dL (ref 70–99)
Potassium: 4.2 mmol/L (ref 3.5–5.1)
Sodium: 134 mmol/L — ABNORMAL LOW (ref 135–145)

## 2022-12-11 LAB — LACTIC ACID, PLASMA: Lactic Acid, Venous: 1.8 mmol/L (ref 0.5–1.9)

## 2022-12-11 LAB — MAGNESIUM: Magnesium: 2 mg/dL (ref 1.7–2.4)

## 2022-12-11 LAB — PROCALCITONIN: Procalcitonin: 0.12 ng/mL

## 2022-12-11 MED ORDER — FERROUS SULFATE 325 (65 FE) MG PO TABS
325.0000 mg | ORAL_TABLET | Freq: Every day | ORAL | Status: DC
  Administered 2022-12-12 – 2022-12-15 (×4): 325 mg via ORAL
  Filled 2022-12-11 (×4): qty 1

## 2022-12-11 MED ORDER — LACTATED RINGERS IV BOLUS
500.0000 mL | Freq: Once | INTRAVENOUS | Status: AC
  Administered 2022-12-11: 500 mL via INTRAVENOUS

## 2022-12-11 MED ORDER — ENOXAPARIN SODIUM 40 MG/0.4ML IJ SOSY
40.0000 mg | PREFILLED_SYRINGE | INTRAMUSCULAR | Status: DC
  Administered 2022-12-11 – 2022-12-14 (×4): 40 mg via SUBCUTANEOUS
  Filled 2022-12-11 (×4): qty 0.4

## 2022-12-11 MED ORDER — METOPROLOL TARTRATE 25 MG PO TABS
12.5000 mg | ORAL_TABLET | Freq: Two times a day (BID) | ORAL | Status: DC
  Administered 2022-12-11 – 2022-12-12 (×4): 12.5 mg via ORAL
  Filled 2022-12-11 (×5): qty 1
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
## 2022-12-11 NOTE — Progress Notes (Signed)
PROGRESS NOTE    Patient: [name]                            PCP: [name], MD                    DOB: [birth year]            DOA: 12/06/2022 [id number]             DOS: 12/11/2022, 10:31 AM   LOS: 5 days   Date of Service: The patient was seen and examined on 12/11/2022  Subjective:   The patient was seen and examined this morning, stable no acute distress awake alert oriented x 3   Status post 2 unit PRBC transfusion on 12/10/2022 Noted for drop in H&H from 7.7--->7.0>>> 6.5 >>> 9.1 No reported bleeding by patient or nursing staff   Brief Narrative:   [name] is  a 37 year old male apparently bedbound from home with significant history of HIV,  , HTN, FTT, Chronic hidradenitis suppurativa involving groin creases, pubic region, buttocks , Anemia of chronic disease, alcoholic liver cirrhosis, GERD and history of orthostatic hypotension in the setting of autonomic dysfunction on chronic decubitus ulcers in the setting of hidradenitis suppurativa presents from home by EMS ??  Altered mentation in the setting of poor oral intake .   Assessment & Plan:   Principal Problem:   Acute metabolic encephalopathy Active Problems:   UTI (urinary tract infection)   Protein-calorie malnutrition, severe (HCC)   GERD (gastroesophageal reflux disease)   HIV disease (HCC)   Anemia   Avascular necrosis of femoral head, left (HCC)   FTT (failure to thrive) in adult   Alcoholic cirrhosis of liver (HCC)   Bipolar disorder (HCC)   Acute metabolic encephalopathy- --resolved, mentation at baseline  -Altered mental status likely related to: UTI, proctitis, dehydration -Multiple hospitalization, -Vital stable -COVID,  flu and RSV negative -Hydrate and treat infection    Acute on chronic anemia - Anemia of chronic disease -No active signs of bleeding -Likely multifactorial-exacerbated by proctitis, chronic groin wounds, hemodilution    Latest Ref Rng & Units 12/11/2022     4:35 AM 12/10/2022    4:10 AM 12/09/2022    4:43 AM  CBC  WBC 4.0 - 10.5 K/uL 13.7  12.1  9.7   Hemoglobin 13.0 - 17.0 g/dL 9.6  9.1  6.5   Hematocrit 39.0 - 52.0 % 31.2  29.6  21.9   Platelets 150 - 400 K/uL 446  447  425    -Transfusing 2U PRBC 12/10/2022  -Obtaining iron studies, Hemoccult, B12, folate -Discontinue heparin for DVT prophylaxis  proctitis--- patient received cefepime and Vanco in the ED --CT abdomen pelvis with possible proctitis - WBC is up to 14.4 >>>9.7 >>12.1  -  Lactic acid is 2.5 Treat empirically with Rocephin and Flagyl >> attempting to de-escalate antibiotics   UTI-- +ve Dysuria, --UA suggestive of UTI -WBC is up to 14.4, 12.1  - Lactic acid is 2.5 >> 1.8  - IV Rocephin as above pending further culture data   HIV, on HAART Cont home meds.  Biktarvy -Patient [facility]    Failure to thrive in adult -Bedbound, -Multiple attempts to ambulate patient unable to hold steady gait or take steps -Give Ensure nutritional supplements Liberalize diet.  On folic acid, multivitamin, thiamine   Bipolar 1 disorder/Schizoaffective disorder/PTSD/Depression/anxiety-- Resume home regimen-including Restoril,  trazodone, Lexapro and Latuda -Xanax as ordered -History of noncompliance with medications   Orthostatic hypotension/autonomic dysfunction -Patient is bedbound -- Increased Midodrine to 15 mg p.o. 3 times daily AC   GERD -Pepcid as ordered   Chronic hidradenitis suppurativa involving groin creases, pubic region, buttocks --see photos in epic -Decubitus ulcers-noted they do not look infected at this time -Some of the wounds are deep and tracking   Status is: Inpatient   Remains inpatient appropriate because:    Dispo: The patient is from: Home              Anticipated d/c is to: SNF -patient is on difficulty discharge list              Anticipated d/c date is: 2 days              Patient currently is not medically stable to  d/c. Patient remains on difficulty discharge list    Barriers: Not Clinically Stable-also difficult to discharge, mom unable to take care of him at home, he is now bedbound, needing assist with all ADLs   ------------------------------------------------------------------------------------------------------------------------- Nutritional status:  The patient's BMI is: Body mass index is 18.46 kg/m. I agree with the assessment and plan as outlined below: Nutrition Status: Nutrition Problem: Severe Malnutrition Etiology: chronic illness, catabolic illness (cirrhosis, HIV, numerous chronic pressure injuries) Signs/Symptoms: energy intake < or equal to 75% for > or equal to 1 month, percent weight loss Percent weight loss: 17 % (x 3 months and 39 % loss x 27 months) Interventions: Boost Plus, Liberalize Diet, Education, Prostat, Juven (vitamin c and thiamine daily)     Skin Assessment: I have examined the patient's skin and I agree with the wound assessment as performed by wound care team As outlined belowe: Pressure Injury 08/27/22 Buttocks Bilateral Stage 2 -  Partial thickness loss of dermis presenting as a shallow open injury with a red, pink wound bed without slough. (Active)  08/27/22   Location: Buttocks  Location Orientation: Bilateral  Staging: Stage 2 -  Partial thickness loss of dermis presenting as a shallow open injury with a red, pink wound bed without slough.  Wound Description (Comments):   Present on Admission: Yes     Pressure Injury 12/06/22 Buttocks Right;Distal Stage 3 -  Full thickness tissue loss. Subcutaneous fat may be visible but bone, tendon or muscle are NOT exposed. (Active)  12/06/22 1826  Location: Buttocks  Location Orientation: Right;Distal  Staging: Stage 3 -  Full thickness tissue loss. Subcutaneous fat may be visible but bone, tendon or muscle are NOT exposed.  Wound Description (Comments):   Present on Admission: Yes  Dressing Type Foam - Lift  dressing to assess site every shift 12/10/22 2000     Pressure Injury 12/06/22 Thigh Right;Upper Stage 3 -  Full thickness tissue loss. Subcutaneous fat may be visible but bone, tendon or muscle are NOT exposed. (Active)  12/06/22 1700  Location: Thigh  Location Orientation: Right;Upper  Staging: Stage 3 -  Full thickness tissue loss. Subcutaneous fat may be visible but bone, tendon or muscle are NOT exposed.  Wound Description (Comments):   Present on Admission: Yes  Dressing Type Foam - Lift dressing to assess site every shift 12/10/22 2000     Pressure Injury 12/06/22 Hip Right Stage 2 -  Partial thickness loss of dermis presenting as a shallow open injury with a red, pink wound bed without slough. (Active)  12/06/22 1700  Location: Hip  Location Orientation: Right  Staging: Stage 2 -  Partial thickness loss of dermis presenting as a shallow open injury with a red, pink wound bed without slough.  Wound Description (Comments):   Present on Admission: Yes  Dressing Type Foam - Lift dressing to assess site every shift 12/10/22 2000     Pressure Injury 12/06/22 Sacrum Right Stage 3 -  Full thickness tissue loss. Subcutaneous fat may be visible but bone, tendon or muscle are NOT exposed. (Active)  12/06/22 1700  Location: Sacrum  Location Orientation: Right  Staging: Stage 3 -  Full thickness tissue loss. Subcutaneous fat may be visible but bone, tendon or muscle are NOT exposed.  Wound Description (Comments):   Present on Admission: Yes  Dressing Type Foam - Lift dressing to assess site every shift 12/10/22 2000     Pressure Injury 12/06/22 Thigh Left;Medial Stage 2 -  Partial thickness loss of dermis presenting as a shallow open injury with a red, pink wound bed without slough. (Active)  12/06/22 1833  Location: Thigh  Location Orientation: Left;Medial  Staging: Stage 2 -  Partial thickness loss of dermis presenting as a shallow open injury with a red, pink wound bed without slough.   Wound Description (Comments):   Present on Admission: Yes  Dressing Type Foam - Lift dressing to assess site every shift 12/10/22 2000     Pressure Injury 12/06/22 Thigh Left;Mid;Upper Stage 3 -  Full thickness tissue loss. Subcutaneous fat may be visible but bone, tendon or muscle are NOT exposed. (Active)  12/06/22 1700  Location: Thigh  Location Orientation: Left;Mid;Upper  Staging: Stage 3 -  Full thickness tissue loss. Subcutaneous fat may be visible but bone, tendon or muscle are NOT exposed.  Wound Description (Comments):   Present on Admission: Yes  Dressing Type Foam - Lift dressing to assess site every shift 12/10/22 2000     Pressure Injury 12/06/22 Knee Left Unstageable - Full thickness tissue loss in which the base of the injury is covered by slough (yellow, tan, gray, green or brown) and/or eschar (tan, brown or black) in the wound bed. (Active)  12/06/22 1700  Location: Knee  Location Orientation: Left  Staging: Unstageable - Full thickness tissue loss in which the base of the injury is covered by slough (yellow, tan, gray, green or brown) and/or eschar (tan, brown or black) in the wound bed.  Wound Description (Comments):   Present on Admission: Yes  Dressing Type Foam - Lift dressing to assess site every shift 12/10/22 2000     Pressure Injury 12/06/22 Heel Left;Lateral Deep Tissue Pressure Injury - Purple or maroon localized area of discolored intact skin or blood-filled blister due to damage of underlying soft tissue from pressure and/or shear. (Active)  12/06/22 1700  Location: Heel  Location Orientation: Left;Lateral  Staging: Deep Tissue Pressure Injury - Purple or maroon localized area of discolored intact skin or blood-filled blister due to damage of underlying soft tissue from pressure and/or shear.  Wound Description (Comments):   Present on Admission: Yes  Dressing Type Foam - Lift dressing to assess site every shift 12/10/22 2000     Pressure Injury  12/06/22 Heel Left;Proximal Deep Tissue Pressure Injury - Purple or maroon localized area of discolored intact skin or blood-filled blister due to damage of underlying soft tissue from pressure and/or shear. (Active)  12/06/22 1700  Location: Heel  Location Orientation: Left;Proximal  Staging: Deep Tissue Pressure Injury - Purple or maroon localized area of discolored intact skin or blood-filled blister due  to damage of underlying soft tissue from pressure and/or shear.  Wound Description (Comments):   Present on Admission: Yes  Dressing Type Foam - Lift dressing to assess site every shift 12/10/22 2000     Pressure Injury 12/06/22 Foot Left;Posterior;Upper Deep Tissue Pressure Injury - Purple or maroon localized area of discolored intact skin or blood-filled blister due to damage of underlying soft tissue from pressure and/or shear. (Active)  12/06/22 1842  Location: Foot  Location Orientation: Left;Posterior;Upper  Staging: Deep Tissue Pressure Injury - Purple or maroon localized area of discolored intact skin or blood-filled blister due to damage of underlying soft tissue from pressure and/or shear.  Wound Description (Comments):   Present on Admission: Yes  Dressing Type Foam - Lift dressing to assess site every shift 12/10/22 2000     Pressure Injury 12/06/22 Heel Right Deep Tissue Pressure Injury - Purple or maroon localized area of discolored intact skin or blood-filled blister due to damage of underlying soft tissue from pressure and/or shear. (Active)  12/06/22 1700  Location: Heel  Location Orientation: Right  Staging: Deep Tissue Pressure Injury - Purple or maroon localized area of discolored intact skin or blood-filled blister due to damage of underlying soft tissue from pressure and/or shear.  Wound Description (Comments):   Present on Admission: Yes  Dressing Type Foam - Lift dressing to assess site every shift 12/10/22 2000     Pressure Injury 12/06/22 Foot Left;Lateral Deep  Tissue Pressure Injury - Purple or maroon localized area of discolored intact skin or blood-filled blister due to damage of underlying soft tissue from pressure and/or shear. (Active)  12/06/22 1700  Location: Foot  Location Orientation: Left;Lateral  Staging: Deep Tissue Pressure Injury - Purple or maroon localized area of discolored intact skin or blood-filled blister due to damage of underlying soft tissue from pressure and/or shear.  Wound Description (Comments):   Present on Admission: Yes  Dressing Type Foam - Lift dressing to assess site every shift 12/10/22 2000     Pressure Injury 12/06/22 Foot Left;Posterior;Upper Deep Tissue Pressure Injury - Purple or maroon localized area of discolored intact skin or blood-filled blister due to damage of underlying soft tissue from pressure and/or shear. (Active)  12/06/22 1700  Location: Foot  Location Orientation: Left;Posterior;Upper  Staging: Deep Tissue Pressure Injury - Purple or maroon localized area of discolored intact skin or blood-filled blister due to damage of underlying soft tissue from pressure and/or shear.  Wound Description (Comments):   Present on Admission: Yes  Dressing Type Foam - Lift dressing to assess site every shift 12/10/22 2000     ----------------------------------------------------------------------------------------------------------------------- Cultures; Blood Cultures x 2 >> NGT -------------------------------------------------------------------------------------------------------------------------  DVT prophylaxis:  Place and maintain sequential compression device Start: 12/08/22 1215 SCDs Start: 12/06/22 1630   Code Status:   Code Status: Full Code  Family Communication: Called his mother Mrs. West Carbo 12/11/22  -Advance care planning has been discussed.   Admission status:   Status is: Inpatient Remains inpatient appropriate because: Eating IV fluids, IV antibiotics,   Disposition: From   - home             Planning for discharge in 1-2 days: to SNF   Procedures:   No admission procedures for hospital encounter.   Antimicrobials:  Anti-infectives (From admission, onward)    Start     Dose/Rate Route Frequency Ordered Stop   12/08/22 2200  metroNIDAZOLE (FLAGYL) tablet 500 mg        500 mg Oral Every 8  hours 12/08/22 1212     12/07/22 0200  vancomycin (VANCOREADY) IVPB 750 mg/150 mL        750 mg 150 mL/hr over 60 Minutes Intravenous Every 12 hours 12/06/22 1409 12/07/22 0215   12/06/22 2200  cefTRIAXone (ROCEPHIN) 1 g in sodium chloride 0.9 % 100 mL IVPB        1 g 200 mL/hr over 30 Minutes Intravenous Every 24 hours 12/06/22 1844     12/06/22 2200  metroNIDAZOLE (FLAGYL) IVPB 500 mg  Status:  Discontinued        500 mg 100 mL/hr over 60 Minutes Intravenous Every 12 hours 12/06/22 1845 12/08/22 1212   12/06/22 2100  ceFEPIme (MAXIPIME) 2 g in sodium chloride 0.9 % 100 mL IVPB  Status:  Discontinued        2 g 200 mL/hr over 30 Minutes Intravenous Every 8 hours 12/06/22 1314 12/06/22 1844   12/06/22 1800  bictegravir-emtricitabine-tenofovir AF (BIKTARVY) 50-200-25 MG per tablet 1 tablet        1 tablet Oral Daily 12/06/22 1633     12/06/22 1800  valACYclovir (VALTREX) tablet 1,000 mg        1,000 mg Oral Daily 12/06/22 1633     12/06/22 1300  vancomycin (VANCOREADY) IVPB 1500 mg/300 mL        1,500 mg 150 mL/hr over 120 Minutes Intravenous  Once 12/06/22 1245 12/06/22 1555   12/06/22 1230  ceFEPIme (MAXIPIME) 2 g in sodium chloride 0.9 % 100 mL IVPB        2 g 200 mL/hr over 30 Minutes Intravenous  Once 12/06/22 1229 12/06/22 1315   12/06/22 1230  metroNIDAZOLE (FLAGYL) IVPB 500 mg        500 mg 100 mL/hr over 60 Minutes Intravenous  Once 12/06/22 1229 12/06/22 1505   12/06/22 1230  vancomycin (VANCOCIN) IVPB 1000 mg/200 mL premix  Status:  Discontinued        1,000 mg 200 mL/hr over 60 Minutes Intravenous  Once 12/06/22 1229 12/06/22 1245         Medication:   (feeding supplement) PROSource Plus  30 mL Oral BID BM   ascorbic acid  500 mg Oral Daily   bictegravir-emtricitabine-tenofovir AF  1 tablet Oral Daily   escitalopram  20 mg Oral Daily   feeding supplement  237 mL Oral BID BM   gabapentin  100 mg Oral BID   lactobacillus  1 g Oral TID   lurasidone  120 mg Oral Daily   metroNIDAZOLE  500 mg Oral Q8H   midodrine  10 mg Oral TID WC   multivitamin with minerals  1 tablet Oral Daily   nicotine  7 mg Transdermal Daily   nutrition supplement (JUVEN)  1 packet Oral BID BM   senna-docusate  2 tablet Oral QHS   sodium chloride flush  3 mL Intravenous Q12H   temazepam  30 mg Oral QHS   thiamine  100 mg Oral Daily   trazodone  200 mg Oral QHS   valACYclovir  1,000 mg Oral Daily    acetaminophen **OR** acetaminophen, ALPRAZolam, bisacodyl, cyclobenzaprine, diphenhydrAMINE, diphenoxylate-atropine, HYDROcodone-acetaminophen, ondansetron, polyethylene glycol   Objective:   Vitals:   12/10/22 1204 12/10/22 2025 12/11/22 0430 12/11/22 1028  BP: 92/60 109/73 101/72 98/62  Pulse: 100 (!) 105 (!) 110 (!) 129  Resp: '16 18 17 19  '$ Temp: 98.2 F (36.8 C) 98.5 F (36.9 C) 98.7 F (37.1 C) 98.5 F (36.9 C)  TempSrc: Oral Oral Oral  Oral  SpO2: 100% 100% 100% 100%  Weight:      Height:        Intake/Output Summary (Last 24 hours) at 12/11/2022 1031 Last data filed at 12/11/2022 0900 Gross per 24 hour  Intake 723 ml  Output 2200 ml  Net -1477 ml   Filed Weights   12/06/22 1010  Weight: 67 kg     Physical examination:       General:  AAO x 3,  cooperative, no distress;   HEENT:  Normocephalic, PERRL, otherwise with in Normal limits   Neuro:  CNII-XII intact. , normal motor and sensation, reflexes intact   Lungs:   Clear to auscultation BL, Respirations unlabored,  No wheezes / crackles  Cardio:    S1/S2, RRR, No murmure, No Rubs or Gallops   Abdomen:  Soft, non-tender, bowel sounds active all four quadrants, no  guarding or peritoneal signs.  Muscular  skeletal:  Limited exam -Sever global generalized weaknesses - in bed, able to move all 4 extremities,   2+ pulses,  symmetric, No pitting edema  Skin:  Dry, warm to touch, see pics in media  Wounds: Please see nursing documentation  Pressure Injury 08/27/22 Buttocks Bilateral Stage 2 -  Partial thickness loss of dermis presenting as a shallow open injury with a red, pink wound bed without slough. (Active)  08/27/22   Location: Buttocks  Location Orientation: Bilateral  Staging: Stage 2 -  Partial thickness loss of dermis presenting as a shallow open injury with a red, pink wound bed without slough.  Wound Description (Comments):   Present on Admission: Yes     Pressure Injury 12/06/22 Buttocks Right;Distal Stage 3 -  Full thickness tissue loss. Subcutaneous fat may be visible but bone, tendon or muscle are NOT exposed. (Active)  12/06/22 1826  Location: Buttocks  Location Orientation: Right;Distal  Staging: Stage 3 -  Full thickness tissue loss. Subcutaneous fat may be visible but bone, tendon or muscle are NOT exposed.  Wound Description (Comments):   Present on Admission: Yes  Dressing Type Foam - Lift dressing to assess site every shift 12/10/22 2000     Pressure Injury 12/06/22 Thigh Right;Upper Stage 3 -  Full thickness tissue loss. Subcutaneous fat may be visible but bone, tendon or muscle are NOT exposed. (Active)  12/06/22 1700  Location: Thigh  Location Orientation: Right;Upper  Staging: Stage 3 -  Full thickness tissue loss. Subcutaneous fat may be visible but bone, tendon or muscle are NOT exposed.  Wound Description (Comments):   Present on Admission: Yes  Dressing Type Foam - Lift dressing to assess site every shift 12/10/22 2000     Pressure Injury 12/06/22 Hip Right Stage 2 -  Partial thickness loss of dermis presenting as a shallow open injury with a red, pink wound bed without slough. (Active)  12/06/22 1700  Location: Hip   Location Orientation: Right  Staging: Stage 2 -  Partial thickness loss of dermis presenting as a shallow open injury with a red, pink wound bed without slough.  Wound Description (Comments):   Present on Admission: Yes  Dressing Type Foam - Lift dressing to assess site every shift 12/10/22 2000     Pressure Injury 12/06/22 Sacrum Right Stage 3 -  Full thickness tissue loss. Subcutaneous fat may be visible but bone, tendon or muscle are NOT exposed. (Active)  12/06/22 1700  Location: Sacrum  Location Orientation: Right  Staging: Stage 3 -  Full thickness tissue loss. Subcutaneous fat may  be visible but bone, tendon or muscle are NOT exposed.  Wound Description (Comments):   Present on Admission: Yes  Dressing Type Foam - Lift dressing to assess site every shift 12/10/22 2000     Pressure Injury 12/06/22 Thigh Left;Medial Stage 2 -  Partial thickness loss of dermis presenting as a shallow open injury with a red, pink wound bed without slough. (Active)  12/06/22 1833  Location: Thigh  Location Orientation: Left;Medial  Staging: Stage 2 -  Partial thickness loss of dermis presenting as a shallow open injury with a red, pink wound bed without slough.  Wound Description (Comments):   Present on Admission: Yes  Dressing Type Foam - Lift dressing to assess site every shift 12/10/22 2000     Pressure Injury 12/06/22 Thigh Left;Mid;Upper Stage 3 -  Full thickness tissue loss. Subcutaneous fat may be visible but bone, tendon or muscle are NOT exposed. (Active)  12/06/22 1700  Location: Thigh  Location Orientation: Left;Mid;Upper  Staging: Stage 3 -  Full thickness tissue loss. Subcutaneous fat may be visible but bone, tendon or muscle are NOT exposed.  Wound Description (Comments):   Present on Admission: Yes  Dressing Type Foam - Lift dressing to assess site every shift 12/10/22 2000     Pressure Injury 12/06/22 Knee Left Unstageable - Full thickness tissue loss in which the base of the  injury is covered by slough (yellow, tan, gray, green or brown) and/or eschar (tan, brown or black) in the wound bed. (Active)  12/06/22 1700  Location: Knee  Location Orientation: Left  Staging: Unstageable - Full thickness tissue loss in which the base of the injury is covered by slough (yellow, tan, gray, green or brown) and/or eschar (tan, brown or black) in the wound bed.  Wound Description (Comments):   Present on Admission: Yes  Dressing Type Foam - Lift dressing to assess site every shift 12/10/22 2000     Pressure Injury 12/06/22 Heel Left;Lateral Deep Tissue Pressure Injury - Purple or maroon localized area of discolored intact skin or blood-filled blister due to damage of underlying soft tissue from pressure and/or shear. (Active)  12/06/22 1700  Location: Heel  Location Orientation: Left;Lateral  Staging: Deep Tissue Pressure Injury - Purple or maroon localized area of discolored intact skin or blood-filled blister due to damage of underlying soft tissue from pressure and/or shear.  Wound Description (Comments):   Present on Admission: Yes  Dressing Type Foam - Lift dressing to assess site every shift 12/10/22 2000     Pressure Injury 12/06/22 Heel Left;Proximal Deep Tissue Pressure Injury - Purple or maroon localized area of discolored intact skin or blood-filled blister due to damage of underlying soft tissue from pressure and/or shear. (Active)  12/06/22 1700  Location: Heel  Location Orientation: Left;Proximal  Staging: Deep Tissue Pressure Injury - Purple or maroon localized area of discolored intact skin or blood-filled blister due to damage of underlying soft tissue from pressure and/or shear.  Wound Description (Comments):   Present on Admission: Yes  Dressing Type Foam - Lift dressing to assess site every shift 12/10/22 2000     Pressure Injury 12/06/22 Foot Left;Posterior;Upper Deep Tissue Pressure Injury - Purple or maroon localized area of discolored intact skin or  blood-filled blister due to damage of underlying soft tissue from pressure and/or shear. (Active)  12/06/22 1842  Location: Foot  Location Orientation: Left;Posterior;Upper  Staging: Deep Tissue Pressure Injury - Purple or maroon localized area of discolored intact skin or blood-filled blister due  to damage of underlying soft tissue from pressure and/or shear.  Wound Description (Comments):   Present on Admission: Yes  Dressing Type Foam - Lift dressing to assess site every shift 12/10/22 2000     Pressure Injury 12/06/22 Heel Right Deep Tissue Pressure Injury - Purple or maroon localized area of discolored intact skin or blood-filled blister due to damage of underlying soft tissue from pressure and/or shear. (Active)  12/06/22 1700  Location: Heel  Location Orientation: Right  Staging: Deep Tissue Pressure Injury - Purple or maroon localized area of discolored intact skin or blood-filled blister due to damage of underlying soft tissue from pressure and/or shear.  Wound Description (Comments):   Present on Admission: Yes  Dressing Type Foam - Lift dressing to assess site every shift 12/10/22 2000     Pressure Injury 12/06/22 Foot Left;Lateral Deep Tissue Pressure Injury - Purple or maroon localized area of discolored intact skin or blood-filled blister due to damage of underlying soft tissue from pressure and/or shear. (Active)  12/06/22 1700  Location: Foot  Location Orientation: Left;Lateral  Staging: Deep Tissue Pressure Injury - Purple or maroon localized area of discolored intact skin or blood-filled blister due to damage of underlying soft tissue from pressure and/or shear.  Wound Description (Comments):   Present on Admission: Yes  Dressing Type Foam - Lift dressing to assess site every shift 12/10/22 2000     Pressure Injury 12/06/22 Foot Left;Posterior;Upper Deep Tissue Pressure Injury - Purple or maroon localized area of discolored intact skin or blood-filled blister due to  damage of underlying soft tissue from pressure and/or shear. (Active)  12/06/22 1700  Location: Foot  Location Orientation: Left;Posterior;Upper  Staging: Deep Tissue Pressure Injury - Purple or maroon localized area of discolored intact skin or blood-filled blister due to damage of underlying soft tissue from pressure and/or shear.  Wound Description (Comments):   Present on Admission: Yes  Dressing Type Foam - Lift dressing to assess site every shift 12/10/22 2000            ------------------------------------------------------------------------------------------------------------------------------    LABs:     Latest Ref Rng & Units 12/11/2022    4:35 AM 12/10/2022    4:10 AM 12/09/2022    4:43 AM  CBC  WBC 4.0 - 10.5 K/uL 13.7  12.1  9.7   Hemoglobin 13.0 - 17.0 g/dL 9.6  9.1  6.5   Hematocrit 39.0 - 52.0 % 31.2  29.6  21.9   Platelets 150 - 400 K/uL 446  447  425       Latest Ref Rng & Units 12/11/2022    4:35 AM 12/10/2022    4:10 AM 12/09/2022    4:43 AM  CMP  Glucose 70 - 99 mg/dL 78  78  85   BUN 6 - 20 mg/dL '21  13  12   '$ Creatinine 0.61 - 1.24 mg/dL 0.89  0.84  0.79   Sodium 135 - 145 mmol/L 134  135  136   Potassium 3.5 - 5.1 mmol/L 4.2  4.0  3.3   Chloride 98 - 111 mmol/L 100  104  103   CO2 22 - 32 mmol/L '27  23  28   '$ Calcium 8.9 - 10.3 mg/dL 8.7  8.3  8.0        Micro Results Recent Results (from the past 240 hour(s))  Resp panel by RT-PCR (RSV, Flu A&B, Covid) Anterior Nasal Swab     Status: None   Collection Time: 12/06/22 10:56 AM  Specimen: Anterior Nasal Swab  Result Value Ref Range Status   SARS Coronavirus 2 by RT PCR NEGATIVE NEGATIVE Final    Comment: (NOTE) SARS-CoV-2 target nucleic acids are NOT DETECTED.  The SARS-CoV-2 RNA is generally detectable in upper respiratory specimens during the acute phase of infection. The lowest concentration of SARS-CoV-2 viral copies this assay can detect is 138 copies/mL. A negative result does not preclude  SARS-Cov-2 infection and should not be used as the sole basis for treatment or other patient management decisions. A negative result may occur with  improper specimen collection/handling, submission of specimen other than nasopharyngeal swab, presence of viral mutation(s) within the areas targeted by this assay, and inadequate number of viral copies(<138 copies/mL). A negative result must be combined with clinical observations, patient history, and epidemiological information. The expected result is Negative.  Fact Sheet for Patients:  EntrepreneurPulse.com.au  Fact Sheet for Healthcare Providers:  IncredibleEmployment.be  This test is no t yet approved or cleared by the [place] FDA and  has been authorized for detection and/or diagnosis of SARS-CoV-2 by FDA under an Emergency Use Authorization (EUA). This EUA will remain  in effect (meaning this test can be used) for the duration of the COVID-19 declaration under Section 564(b)(1) of the Act, 21 U.S.C.section 360bbb-3(b)(1), unless the authorization is terminated  or revoked sooner.       Influenza A by PCR NEGATIVE NEGATIVE Final   Influenza B by PCR NEGATIVE NEGATIVE Final    Comment: (NOTE) The Xpert Xpress SARS-CoV-2/FLU/RSV plus assay is intended as an aid in the diagnosis of influenza from Nasopharyngeal swab specimens and should not be used as a sole basis for treatment. Nasal washings and aspirates are unacceptable for Xpert Xpress SARS-CoV-2/FLU/RSV testing.  Fact Sheet for Patients: EntrepreneurPulse.com.au  Fact Sheet for Healthcare Providers: IncredibleEmployment.be  This test is not yet approved or cleared by the [place] FDA and has been authorized for detection and/or diagnosis of SARS-CoV-2 by FDA under an Emergency Use Authorization (EUA). This EUA will remain in effect (meaning this test can be used) for the duration of  the COVID-19 declaration under Section 564(b)(1) of the Act, 21 U.S.C. section 360bbb-3(b)(1), unless the authorization is terminated or revoked.     Resp Syncytial Virus by PCR NEGATIVE NEGATIVE Final    Comment: (NOTE) Fact Sheet for Patients: EntrepreneurPulse.com.au  Fact Sheet for Healthcare Providers: IncredibleEmployment.be  This test is not yet approved or cleared by the [place] FDA and has been authorized for detection and/or diagnosis of SARS-CoV-2 by FDA under an Emergency Use Authorization (EUA). This EUA will remain in effect (meaning this test can be used) for the duration of the COVID-19 declaration under Section 564(b)(1) of the Act, 21 U.S.C. section 360bbb-3(b)(1), unless the authorization is terminated or revoked.  Performed at [facility], [address] [place], [place] [address]   Blood Culture (routine x 2)     Status: None   Collection Time: 12/06/22 11:17 AM   Specimen: BLOOD  Result Value Ref Range Status   Specimen Description BLOOD RIGHT ASSIST CONTROL  Final   Special Requests   Final    BOTTLES DRAWN AEROBIC AND ANAEROBIC Blood Culture results may not be optimal due to an excessive volume of blood received in culture bottles   Culture   Final    NO GROWTH 5 DAYS Performed at [facility], [address] [place], [place] [address]    Report Status 12/11/2022 FINAL  Final  Blood Culture (routine  x 2)     Status: None (Preliminary result)   Collection Time: 12/06/22 11:17 AM   Specimen: BLOOD  Result Value Ref Range Status   Specimen Description   Final    BLOOD BLOOD RIGHT ARM Performed at [facility], [address], [place], [place] [address]    Special Requests   Final    BOTTLES DRAWN AEROBIC AND ANAEROBIC Blood Culture adequate volume Performed at [facility], [address] [place], [place] [address]    Culture  Setup Time   Final    GRAM POSITIVE RODS Gram Stain Report Called to,Read Back  By and Verified With: CATES LAND M ON 12/07/22 AT 2230 BY LOY,C AEROBIC BOTTLE PERFORMED AT [facility]    Culture   Final    GRAM POSITIVE RODS sent to [facility] for identification Performed at [facility], [address], [place], [place] [address]    Report Status PENDING  Incomplete  Urine Culture     Status: Abnormal   Collection Time: 12/06/22  2:00 PM   Specimen: Urine, Random  Result Value Ref Range Status   Specimen Description   Final    URINE, RANDOM Performed at [facility], [address] [place], [place] [address]    Special Requests   Final    NONE Reflexed from [id number] Performed at [facility], [address], [place], [place] [address]    Culture MULTIPLE SPECIES PRESENT, SUGGEST RECOLLECTION (A)  Final   Report Status 12/08/2022 FINAL  Final    Radiology Reports No results found.  SIGNED: Deatra James, MD, FHM. FAAFP. Zacarias Pontes - [facility] Time spent > 55 min.  In seeing, evaluating and examining the patient. Reviewing medical records, labs, drawn plan of care. [facility],  Pager (please use [email or web address] to page/ text) Please use Epic Secure Chat for non-urgent communication (7AM-7PM)  If 7PM-7AM, please contact night-coverage [email or web address], 12/11/2022, 10:31 AM

## 2022-12-11 NOTE — Progress Notes (Signed)
   12/11/22 1028  Vitals  Temp 98.5 F (36.9 C)  Temp Source Oral  BP 98/62  MAP (mmHg) 73  BP Location Right Arm  BP Method Automatic  Patient Position (if appropriate) Lying  Pulse Rate (!) 129  Pulse Rate Source Monitor  Resp 19  MEWS COLOR  MEWS Score Color Yellow  Oxygen Therapy  SpO2 100 %  O2 Device Room Air  MEWS Score  MEWS Temp 0  MEWS Systolic 1  MEWS Pulse 2  MEWS RR 0  MEWS LOC 0  MEWS Score 3  Provider Notification  Provider Name/Title Skipper Cliche MD  Date Provider Notified 12/11/22  Time Provider Notified 1045  Notification Reason Other (Comment)  Test performed and critical result yellow MEWS

## 2022-12-11 NOTE — Progress Notes (Addendum)
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   Medical History: Past Medical History:  Diagnosis Date   Bipolar 1 disorder ([place])    Depression    Dizziness and giddiness 02/01/2016   Failure to thrive in adult    Herpes genitalia    HIV disease ([place])    Hypertension    Hyponatremia    Hypothermia 08/24/2021   Migraine headache 02/01/2016   Peripheral neuropathy 10/01/2019   PTSD (post-traumatic stress disorder)    Schizoaffective disorder (H[facility])    Seizures ([place])     Medications:  Medications Prior to Admission  Medication Sig Dispense Refill Last Dose   acetaminophen (TYLENOL) 500 MG tablet Take 1,000 mg by mouth every 6 (six) hours as needed for moderate pain or mild pain.   unk   ALPRAZolam (XANAX) 0.5 MG tablet Take 1 tablet (0.5 mg total) by mouth 3 (three) times daily as needed for anxiety. 30 tablet 0 Past Week   bictegravir-emtricitabine-tenofovir AF (BIKTARVY) 50-200-25 MG TABS tablet Take 1 tablet by mouth daily. 30 tablet 0 Past Week   cyclobenzaprine (FLEXERIL) 10 MG tablet Take 10 mg by mouth 3 (three) times daily as needed.   Past Week    diphenoxylate-atropine (LOMOTIL) 2.5-0.025 MG tablet Take 1 tablet by mouth daily.   Past Week   escitalopram (LEXAPRO) 20 MG tablet Take 1 tablet (20 mg total) by mouth daily. 30 tablet 0 Past Week   famotidine (PEPCID) 40 MG tablet Take 40 mg by mouth every morning.   Past Week   furosemide (LASIX) 40 MG tablet Take 40 mg by mouth daily as needed.   Past Week   ibuprofen (ADVIL) 200 MG tablet Take 600 mg by mouth every 6 (six) hours as needed for mild pain or moderate pain.   unk   lip balm (CARMEX) ointment Apply topically as needed for lip care. 7 g 0 Past Week   Lurasidone HCl 120 MG TABS Take 1 tablet (120 mg total) by mouth daily. 30 tablet 0 Past Week   Multiple Vitamin (MULTIVITAMIN WITH MINERALS) TABS tablet Take 1 tablet by mouth daily. 30 tablet 0 Past Week   ondansetron (ZOFRAN) 8 MG tablet Take 8 mg by mouth every 8 (eight) hours as needed.   unk   senna-docusate (SENOKOT-S) 8.6-50 MG tablet Take 1 tablet by mouth at bedtime as needed for moderate constipation. 30 tablet 0 unk   temazepam (RESTORIL) 30 MG capsule Take 30 mg by mouth at bedtime.   Past Week   thiamine (VITAMIN B1) 100 MG tablet Take 1 tablet (100 mg total) by mouth daily. 30 tablet 0 Past Week  trazodone (DESYREL) 300 MG tablet Take 300 mg by mouth at bedtime.   Past Week   trolamine salicylate (ASPERCREME) 10 % cream Apply 1 Application topically as needed for muscle pain.   unk   valACYclovir (VALTREX) 1000 MG tablet Take 1 tablet (1,000 mg total) by mouth daily. 30 tablet 0 Past Week   albuterol (VENTOLIN HFA) 108 (90 Base) MCG/ACT inhaler Inhale 1 puff into the lungs every 6 (six) hours as needed for wheezing or shortness of breath. 6.7 g 0    feeding supplement (ENSURE ENLIVE / ENSURE PLUS) LIQD Take 237 mLs by mouth 2 (two) times daily between meals. 123XX123 mL 12    folic acid (FOLVITE) 1 MG tablet Take 1 tablet (1 mg total) by mouth daily. (Patient not taking: Reported on 12/06/2022) 30 tablet 0 Not Taking    haloperidol (HALDOL) 5 MG tablet Take 1 tablet (5 mg total) by mouth daily. (Patient not taking: Reported on 12/06/2022) 30 tablet 0 Not Taking   HYDROcodone-acetaminophen (NORCO/VICODIN) 5-325 MG tablet Take 1 tablet by mouth 2 (two) times daily as needed. (Patient not taking: Reported on 12/06/2022)   Not Taking   nystatin (MYCOSTATIN/NYSTOP) powder Apply topically 2 (two) times daily. (Patient not taking: Reported on 12/06/2022) 15 g 0 Not Taking   pantoprazole (PROTONIX) 40 MG tablet Take 1 tablet (40 mg total) by mouth daily. (Patient not taking: Reported on 12/06/2022) 30 tablet 0 Not Taking   Polyethyl Glycol-Propyl Glycol (SYSTANE OP) Place 1-2 drops into both eyes daily as needed (Dry eyes). (Patient not taking: Reported on 12/06/2022)   Not Taking    Assessment: Patient admitted for tx of proctitis. He also has anemia of chronic disease, alcoholic lever cirrhosis, and decubitus ulcers. Patient did have drop of hgb 8.2>7.7> 7> 6.5 and was transfused 2 units of PRBC. Hgb since 3/2 9.1> 9.6 is stable. VTE px with lovenox  Goal of Therapy:   Monitor platelets by anticoagulation protocol: Yes   Plan:  Lovenox '40mg'$  sq q24h Monitor for S/S of bleeding CBC MWF   Isac Sarna, BS Pharm D, BCPS Clinical Pharmacist 12/11/2022,2:00 PM

## 2022-12-12 DIAGNOSIS — G9341 Metabolic encephalopathy: Secondary | ICD-10-CM | POA: Diagnosis not present

## 2022-12-12 LAB — CBC
HCT: 31.5 % — ABNORMAL LOW (ref 39.0–52.0)
Hemoglobin: 9.4 g/dL — ABNORMAL LOW (ref 13.0–17.0)
MCH: 27.7 pg (ref 26.0–34.0)
MCHC: 29.8 g/dL — ABNORMAL LOW (ref 30.0–36.0)
MCV: 92.9 fL (ref 80.0–100.0)
Platelets: 466 10*3/uL — ABNORMAL HIGH (ref 150–400)
RBC: 3.39 MIL/uL — ABNORMAL LOW (ref 4.22–5.81)
RDW: 15.7 % — ABNORMAL HIGH (ref 11.5–15.5)
WBC: 13.3 10*3/uL — ABNORMAL HIGH (ref 4.0–10.5)
nRBC: 0 % (ref 0.0–0.2)

## 2022-12-12 LAB — BASIC METABOLIC PANEL
Anion gap: 8 (ref 5–15)
BUN: 20 mg/dL (ref 6–20)
CO2: 26 mmol/L (ref 22–32)
Calcium: 8.7 mg/dL — ABNORMAL LOW (ref 8.9–10.3)
Chloride: 99 mmol/L (ref 98–111)
Creatinine, Ser: 1.09 mg/dL (ref 0.61–1.24)
GFR, Estimated: >60 mL/min (ref >60)
Glucose, Bld: 94 mg/dL (ref 70–99)
Potassium: 3.7 mmol/L (ref 3.5–5.1)
Sodium: 133 mmol/L — ABNORMAL LOW (ref 135–145)

## 2022-12-12 MED ORDER — TRAZODONE HCL 50 MG PO TABS
150.0000 mg | ORAL_TABLET | Freq: Every day | ORAL | Status: DC
  Administered 2022-12-12: 150 mg via ORAL
  Filled 2022-12-12: qty 3

## 2022-12-12 MED ORDER — AMOXICILLIN-POT CLAVULANATE 875-125 MG PO TABS
1.0000 | ORAL_TABLET | Freq: Two times a day (BID) | ORAL | Status: AC
  Administered 2022-12-12 – 2022-12-13 (×3): 1 via ORAL
  Filled 2022-12-12 (×3): qty 1

## 2022-12-12 NOTE — Progress Notes (Signed)
PROGRESS NOTE    Patient: [name]                            PCP: [name], MD                    DOB: 23-Mar-1986            DOA: 12/06/2022 [id number]             DOS: 12/12/2022, 11:02 AM   LOS: 6 days   Date of Service: The patient was seen and examined on 12/12/2022  Subjective:   The patient was seen and examined this morning, stable no acute distress No issues overnight   S/P 2  unit PRBC transfusion on 12/10/2022 Noted for drop in H&H from 7.7--->7.0>>> 6.5 >>> 9.1, 9.4 No reported bleeding by patient or nursing staff   Brief Narrative:    [name] is  a 37 year old male apparently bedbound from home with significant history of HIV,  , HTN, FTT, Chronic hidradenitis suppurativa involving groin creases, pubic region, buttocks , Anemia of chronic disease, alcoholic liver cirrhosis, GERD and history of orthostatic hypotension in the setting of autonomic dysfunction on chronic decubitus ulcers in the setting of hidradenitis suppurativa presents from home by EMS ??  Altered mentation in the setting of poor oral intake .   Assessment & Plan:   Principal Problem:   Acute metabolic encephalopathy Active Problems:   UTI (urinary tract infection)   Protein-calorie malnutrition, severe (HCC)   GERD (gastroesophageal reflux disease)   HIV disease (HCC)   Anemia   Avascular necrosis of femoral head, left (HCC)   FTT (failure to thrive) in adult   Alcoholic cirrhosis of liver (HCC)   Bipolar disorder (HCC)   Acute metabolic encephalopathy- --resolved, mentation at baseline  -Altered mental status likely related to: UTI, proctitis, dehydration -Multiple hospitalization, -Vital stable -COVID,  flu and RSV negative -Hydrate and treat infection    Acute on chronic anemia - Anemia of chronic disease -No active signs of bleeding -Hemoglobin 6.5 on 12/09/2022 -Likely multifactorial-exacerbated by proctitis, chronic groin wounds, hemodilution    Latest Ref  Rng & Units 12/12/2022    4:58 AM 12/11/2022    4:35 AM 12/10/2022    4:10 AM  CBC  WBC 4.0 - 10.5 K/uL 13.3  13.7  12.1   Hemoglobin 13.0 - 17.0 g/dL 9.4  9.6  9.1   Hematocrit 39.0 - 52.0 % 31.5  31.2  29.6   Platelets 150 - 400 K/uL 466  446  447    -Transfusing 2U PRBC 12/10/2022  -Obtaining iron studies, Hemoccult, B12, folate -Discontinue heparin for DVT prophylaxis  proctitis--- patient received cefepime and Vanco in the ED --CT abdomen pelvis with possible proctitis - WBC is up to 14.4 >>>9.7 >>12.1  -  Lactic acid is 2.5 Treat empirically with Rocephin and Flagyl >> 12/12/2022 switching to p.o. Augmentin for 3 more days concluding 10 days of antibiotics   UTI-- +ve Dysuria, --UA suggestive of UTI -WBC is up to 14.4, 12.1  - Lactic acid is 2.5 >> 1.8  - IV Rocephin >> switch to p.o. Augmentin 12/12/2022 for 3 more days concluding 10 days of antibiotics   HIV, on HAART Cont home meds.  Biktarvy -Patient sees infectious disease at [facility]    Failure to thrive in adult -Bedbound, -Multiple attempts to ambulate patient unable to hold steady gait or take steps -  Give Ensure nutritional supplements Liberalize diet.  On folic acid, multivitamin, thiamine   Bipolar 1 disorder/Schizoaffective disorder/PTSD/Depression/anxiety-- Resume home regimen-including Restoril, trazodone, Lexapro and Latuda -Xanax as ordered -History of noncompliance with medications   Orthostatic hypotension/autonomic dysfunction -Patient is bedbound -- Increased Midodrine to 15 mg p.o. 3 times daily AC   GERD -Pepcid as ordered   Chronic hidradenitis suppurativa involving groin creases, pubic region, buttocks --see photos in epic -Decubitus ulcers-noted they do not look infected at this time -Some of the wounds are deep and tracking   Status is: Inpatient   Remains inpatient appropriate because:    Dispo: The patient is from: Home              Anticipated d/c is to: SNF -patient is on difficulty  discharge list              Anticipated d/c date is: 2 days              Patient currently is not medically stable to d/c.  Patient remains on difficulty discharge list   Barriers: Clinically Stable now -also difficult to discharge, mom unable to take care of him at home, he is now bedbound, needing assist with all ADLs   ------------------------------------------------------------------------------------------------------------------------- Nutritional status:  The patient's BMI is: Body mass index is 18.46 kg/m. I agree with the assessment and plan as outlined below: Nutrition Status: Nutrition Problem: Severe Malnutrition Etiology: chronic illness, catabolic illness (cirrhosis, HIV, numerous chronic pressure injuries) Signs/Symptoms: energy intake < or equal to 75% for > or equal to 1 month, percent weight loss Percent weight loss: 17 % (x 3 months and 39 % loss x 27 months) Interventions: Boost Plus, Liberalize Diet, Education, Prostat, Juven (vitamin c and thiamine daily)     Skin Assessment: I have examined the patient's skin and I agree with the wound assessment as performed by wound care team As outlined belowe: Pressure Injury 08/27/22 Buttocks Bilateral Stage 2 -  Partial thickness loss of dermis presenting as a shallow open injury with a red, pink wound bed without slough. (Active)  08/27/22   Location: Buttocks  Location Orientation: Bilateral  Staging: Stage 2 -  Partial thickness loss of dermis presenting as a shallow open injury with a red, pink wound bed without slough.  Wound Description (Comments):   Present on Admission: Yes     Pressure Injury 12/06/22 Buttocks Right;Distal Stage 3 -  Full thickness tissue loss. Subcutaneous fat may be visible but bone, tendon or muscle are NOT exposed. (Active)  12/06/22 1826  Location: Buttocks  Location Orientation: Right;Distal  Staging: Stage 3 -  Full thickness tissue loss. Subcutaneous fat may be visible but bone,  tendon or muscle are NOT exposed.  Wound Description (Comments):   Present on Admission: Yes  Dressing Type Foam - Lift dressing to assess site every shift 12/11/22 1933     Pressure Injury 12/06/22 Thigh Right;Upper Stage 3 -  Full thickness tissue loss. Subcutaneous fat may be visible but bone, tendon or muscle are NOT exposed. (Active)  12/06/22 1700  Location: Thigh  Location Orientation: Right;Upper  Staging: Stage 3 -  Full thickness tissue loss. Subcutaneous fat may be visible but bone, tendon or muscle are NOT exposed.  Wound Description (Comments):   Present on Admission: Yes  Dressing Type Foam - Lift dressing to assess site every shift 12/11/22 1933     Pressure Injury 12/06/22 Hip Right Stage 2 -  Partial thickness loss of dermis presenting as  a shallow open injury with a red, pink wound bed without slough. (Active)  12/06/22 1700  Location: Hip  Location Orientation: Right  Staging: Stage 2 -  Partial thickness loss of dermis presenting as a shallow open injury with a red, pink wound bed without slough.  Wound Description (Comments):   Present on Admission: Yes  Dressing Type Foam - Lift dressing to assess site every shift 12/11/22 1933     Pressure Injury 12/06/22 Sacrum Right Stage 3 -  Full thickness tissue loss. Subcutaneous fat may be visible but bone, tendon or muscle are NOT exposed. (Active)  12/06/22 1700  Location: Sacrum  Location Orientation: Right  Staging: Stage 3 -  Full thickness tissue loss. Subcutaneous fat may be visible but bone, tendon or muscle are NOT exposed.  Wound Description (Comments):   Present on Admission: Yes  Dressing Type Foam - Lift dressing to assess site every shift 12/11/22 1933     Pressure Injury 12/06/22 Thigh Left;Medial Stage 2 -  Partial thickness loss of dermis presenting as a shallow open injury with a red, pink wound bed without slough. (Active)  12/06/22 1833  Location: Thigh  Location Orientation: Left;Medial  Staging:  Stage 2 -  Partial thickness loss of dermis presenting as a shallow open injury with a red, pink wound bed without slough.  Wound Description (Comments):   Present on Admission: Yes  Dressing Type Foam - Lift dressing to assess site every shift 12/11/22 1933     Pressure Injury 12/06/22 Thigh Left;Mid;Upper Stage 3 -  Full thickness tissue loss. Subcutaneous fat may be visible but bone, tendon or muscle are NOT exposed. (Active)  12/06/22 1700  Location: Thigh  Location Orientation: Left;Mid;Upper  Staging: Stage 3 -  Full thickness tissue loss. Subcutaneous fat may be visible but bone, tendon or muscle are NOT exposed.  Wound Description (Comments):   Present on Admission: Yes  Dressing Type Foam - Lift dressing to assess site every shift 12/11/22 1933     Pressure Injury 12/06/22 Knee Left Unstageable - Full thickness tissue loss in which the base of the injury is covered by slough (yellow, tan, gray, green or brown) and/or eschar (tan, brown or black) in the wound bed. (Active)  12/06/22 1700  Location: Knee  Location Orientation: Left  Staging: Unstageable - Full thickness tissue loss in which the base of the injury is covered by slough (yellow, tan, gray, green or brown) and/or eschar (tan, brown or black) in the wound bed.  Wound Description (Comments):   Present on Admission: Yes  Dressing Type Foam - Lift dressing to assess site every shift 12/11/22 1933     Pressure Injury 12/06/22 Heel Left;Lateral Deep Tissue Pressure Injury - Purple or maroon localized area of discolored intact skin or blood-filled blister due to damage of underlying soft tissue from pressure and/or shear. (Active)  12/06/22 1700  Location: Heel  Location Orientation: Left;Lateral  Staging: Deep Tissue Pressure Injury - Purple or maroon localized area of discolored intact skin or blood-filled blister due to damage of underlying soft tissue from pressure and/or shear.  Wound Description (Comments):   Present  on Admission: Yes  Dressing Type Foam - Lift dressing to assess site every shift 12/11/22 1933     Pressure Injury 12/06/22 Heel Left;Proximal Deep Tissue Pressure Injury - Purple or maroon localized area of discolored intact skin or blood-filled blister due to damage of underlying soft tissue from pressure and/or shear. (Active)  12/06/22 1700  Location: Heel  Location Orientation: Left;Proximal  Staging: Deep Tissue Pressure Injury - Purple or maroon localized area of discolored intact skin or blood-filled blister due to damage of underlying soft tissue from pressure and/or shear.  Wound Description (Comments):   Present on Admission: Yes  Dressing Type Foam - Lift dressing to assess site every shift 12/11/22 1933     Pressure Injury 12/06/22 Foot Left;Posterior;Upper Deep Tissue Pressure Injury - Purple or maroon localized area of discolored intact skin or blood-filled blister due to damage of underlying soft tissue from pressure and/or shear. (Active)  12/06/22 1842  Location: Foot  Location Orientation: Left;Posterior;Upper  Staging: Deep Tissue Pressure Injury - Purple or maroon localized area of discolored intact skin or blood-filled blister due to damage of underlying soft tissue from pressure and/or shear.  Wound Description (Comments):   Present on Admission: Yes  Dressing Type Foam - Lift dressing to assess site every shift 12/11/22 1933     Pressure Injury 12/06/22 Heel Right Deep Tissue Pressure Injury - Purple or maroon localized area of discolored intact skin or blood-filled blister due to damage of underlying soft tissue from pressure and/or shear. (Active)  12/06/22 1700  Location: Heel  Location Orientation: Right  Staging: Deep Tissue Pressure Injury - Purple or maroon localized area of discolored intact skin or blood-filled blister due to damage of underlying soft tissue from pressure and/or shear.  Wound Description (Comments):   Present on Admission: Yes  Dressing  Type Foam - Lift dressing to assess site every shift 12/11/22 1933     Pressure Injury 12/06/22 Foot Left;Lateral Deep Tissue Pressure Injury - Purple or maroon localized area of discolored intact skin or blood-filled blister due to damage of underlying soft tissue from pressure and/or shear. (Active)  12/06/22 1700  Location: Foot  Location Orientation: Left;Lateral  Staging: Deep Tissue Pressure Injury - Purple or maroon localized area of discolored intact skin or blood-filled blister due to damage of underlying soft tissue from pressure and/or shear.  Wound Description (Comments):   Present on Admission: Yes  Dressing Type Foam - Lift dressing to assess site every shift 12/11/22 1933     Pressure Injury 12/06/22 Foot Left;Posterior;Upper Deep Tissue Pressure Injury - Purple or maroon localized area of discolored intact skin or blood-filled blister due to damage of underlying soft tissue from pressure and/or shear. (Active)  12/06/22 1700  Location: Foot  Location Orientation: Left;Posterior;Upper  Staging: Deep Tissue Pressure Injury - Purple or maroon localized area of discolored intact skin or blood-filled blister due to damage of underlying soft tissue from pressure and/or shear.  Wound Description (Comments):   Present on Admission: Yes  Dressing Type Foam - Lift dressing to assess site every shift 12/11/22 1933     ----------------------------------------------------------------------------------------------------------------------- Cultures; Blood Cultures x 2 >> NGT -------------------------------------------------------------------------------------------------------------------------  DVT prophylaxis:  enoxaparin (LOVENOX) injection 40 mg Start: 12/11/22 1600 Place and maintain sequential compression device Start: 12/08/22 1215 SCDs Start: 12/06/22 1630   Code Status:   Code Status: Full Code  Family Communication: Called his mother Mrs. West Carbo  12/11/22  -Advance care planning has been discussed.   Admission status:   Status is: Inpatient Remains inpatient appropriate because: Eating IV fluids, IV antibiotics,   Disposition: From  - home             Planning for discharge in 1-2 days: to SNF   Procedures:   No admission procedures for hospital encounter.   Antimicrobials:  Anti-infectives (From admission, onward)    Start  Dose/Rate Route Frequency Ordered Stop   12/12/22 1145  amoxicillin-clavulanate (AUGMENTIN) 875-125 MG per tablet 1 tablet        1 tablet Oral Every 12 hours 12/12/22 1058 12/13/22 2159   12/08/22 2200  metroNIDAZOLE (FLAGYL) tablet 500 mg  Status:  Discontinued        500 mg Oral Every 8 hours 12/08/22 1212 12/12/22 1057   12/07/22 0200  vancomycin (VANCOREADY) IVPB 750 mg/150 mL        750 mg 150 mL/hr over 60 Minutes Intravenous Every 12 hours 12/06/22 1409 12/07/22 0215   12/06/22 2200  cefTRIAXone (ROCEPHIN) 1 g in sodium chloride 0.9 % 100 mL IVPB  Status:  Discontinued        1 g 200 mL/hr over 30 Minutes Intravenous Every 24 hours 12/06/22 1844 12/12/22 1058   12/06/22 2200  metroNIDAZOLE (FLAGYL) IVPB 500 mg  Status:  Discontinued        500 mg 100 mL/hr over 60 Minutes Intravenous Every 12 hours 12/06/22 1845 12/08/22 1212   12/06/22 2100  ceFEPIme (MAXIPIME) 2 g in sodium chloride 0.9 % 100 mL IVPB  Status:  Discontinued        2 g 200 mL/hr over 30 Minutes Intravenous Every 8 hours 12/06/22 1314 12/06/22 1844   12/06/22 1800  bictegravir-emtricitabine-tenofovir AF (BIKTARVY) 50-200-25 MG per tablet 1 tablet        1 tablet Oral Daily 12/06/22 1633     12/06/22 1800  valACYclovir (VALTREX) tablet 1,000 mg        1,000 mg Oral Daily 12/06/22 1633     12/06/22 1300  vancomycin (VANCOREADY) IVPB 1500 mg/300 mL        1,500 mg 150 mL/hr over 120 Minutes Intravenous  Once 12/06/22 1245 12/06/22 1555   12/06/22 1230  ceFEPIme (MAXIPIME) 2 g in sodium chloride 0.9 % 100 mL IVPB        2  g 200 mL/hr over 30 Minutes Intravenous  Once 12/06/22 1229 12/06/22 1315   12/06/22 1230  metroNIDAZOLE (FLAGYL) IVPB 500 mg        500 mg 100 mL/hr over 60 Minutes Intravenous  Once 12/06/22 1229 12/06/22 1505   12/06/22 1230  vancomycin (VANCOCIN) IVPB 1000 mg/200 mL premix  Status:  Discontinued        1,000 mg 200 mL/hr over 60 Minutes Intravenous  Once 12/06/22 1229 12/06/22 1245        Medication:   (feeding supplement) PROSource Plus  30 mL Oral BID BM   amoxicillin-clavulanate  1 tablet Oral Q12H   ascorbic acid  500 mg Oral Daily   bictegravir-emtricitabine-tenofovir AF  1 tablet Oral Daily   enoxaparin (LOVENOX) injection  40 mg Subcutaneous Q24H   escitalopram  20 mg Oral Daily   feeding supplement  237 mL Oral BID BM   ferrous sulfate  325 mg Oral Q breakfast   gabapentin  100 mg Oral BID   lactobacillus  1 g Oral TID   lurasidone  120 mg Oral Daily   metoprolol tartrate  12.5 mg Oral BID   midodrine  10 mg Oral TID WC   multivitamin with minerals  1 tablet Oral Daily   nicotine  7 mg Transdermal Daily   nutrition supplement (JUVEN)  1 packet Oral BID BM   senna-docusate  2 tablet Oral QHS   sodium chloride flush  3 mL Intravenous Q12H   temazepam  30 mg Oral QHS   thiamine  100  mg Oral Daily   trazodone  150 mg Oral QHS   valACYclovir  1,000 mg Oral Daily    acetaminophen **OR** acetaminophen, ALPRAZolam, bisacodyl, cyclobenzaprine, diphenhydrAMINE, diphenoxylate-atropine, HYDROcodone-acetaminophen, ondansetron, polyethylene glycol   Objective:   Vitals:   12/11/22 1431 12/11/22 2105 12/12/22 0448 12/12/22 0835  BP: (!) 93/59 102/73 105/65 100/70  Pulse: (!) 101 (!) 102 100 100  Resp: '19 18 19 16  '$ Temp: 98.6 F (37 C) 98.7 F (37.1 C) 98.5 F (36.9 C) 98 F (36.7 C)  TempSrc: Oral Oral Oral Oral  SpO2: 100% 100% 98% 100%  Weight:      Height:        Intake/Output Summary (Last 24 hours) at 12/12/2022 1102 Last data filed at 12/12/2022 0900 Gross  per 24 hour  Intake 843 ml  Output 2150 ml  Net -1307 ml   Filed Weights   12/06/22 1010  Weight: 67 kg     Physical examination:   General:  AAO x 3,  cooperative, no distress;   HEENT:  Normocephalic, PERRL, otherwise with in Normal limits   Neuro:  CNII-XII intact. , normal motor and sensation, reflexes intact   Lungs:   Clear to auscultation BL, Respirations unlabored,  No wheezes / crackles  Cardio:    S1/S2, RRR, No murmure, No Rubs or Gallops   Abdomen:  Soft, non-tender, bowel sounds active all four quadrants, no guarding or peritoneal signs.  Muscular  skeletal:  Bedbound  Limited exam -sever global generalized weaknesses - in bed, able to move all 4 extremities,   2+ pulses,  symmetric, No pitting edema  Skin:  Dry, warm to touch, see description below on pics in media  Wounds: Please see nursing documentation  Pressure Injury 08/27/22 Buttocks Bilateral Stage 2 -  Partial thickness loss of dermis presenting as a shallow open injury with a red, pink wound bed without slough. (Active)  08/27/22   Location: Buttocks  Location Orientation: Bilateral  Staging: Stage 2 -  Partial thickness loss of dermis presenting as a shallow open injury with a red, pink wound bed without slough.  Wound Description (Comments):   Present on Admission: Yes     Pressure Injury 12/06/22 Buttocks Right;Distal Stage 3 -  Full thickness tissue loss. Subcutaneous fat may be visible but bone, tendon or muscle are NOT exposed. (Active)  12/06/22 1826  Location: Buttocks  Location Orientation: Right;Distal  Staging: Stage 3 -  Full thickness tissue loss. Subcutaneous fat may be visible but bone, tendon or muscle are NOT exposed.  Wound Description (Comments):   Present on Admission: Yes  Dressing Type Foam - Lift dressing to assess site every shift 12/11/22 1933     Pressure Injury 12/06/22 Thigh Right;Upper Stage 3 -  Full thickness tissue loss. Subcutaneous fat may be visible but bone,  tendon or muscle are NOT exposed. (Active)  12/06/22 1700  Location: Thigh  Location Orientation: Right;Upper  Staging: Stage 3 -  Full thickness tissue loss. Subcutaneous fat may be visible but bone, tendon or muscle are NOT exposed.  Wound Description (Comments):   Present on Admission: Yes  Dressing Type Foam - Lift dressing to assess site every shift 12/11/22 1933     Pressure Injury 12/06/22 Hip Right Stage 2 -  Partial thickness loss of dermis presenting as a shallow open injury with a red, pink wound bed without slough. (Active)  12/06/22 1700  Location: Hip  Location Orientation: Right  Staging: Stage 2 -  Partial thickness  loss of dermis presenting as a shallow open injury with a red, pink wound bed without slough.  Wound Description (Comments):   Present on Admission: Yes  Dressing Type Foam - Lift dressing to assess site every shift 12/11/22 1933     Pressure Injury 12/06/22 Sacrum Right Stage 3 -  Full thickness tissue loss. Subcutaneous fat may be visible but bone, tendon or muscle are NOT exposed. (Active)  12/06/22 1700  Location: Sacrum  Location Orientation: Right  Staging: Stage 3 -  Full thickness tissue loss. Subcutaneous fat may be visible but bone, tendon or muscle are NOT exposed.  Wound Description (Comments):   Present on Admission: Yes  Dressing Type Foam - Lift dressing to assess site every shift 12/11/22 1933     Pressure Injury 12/06/22 Thigh Left;Medial Stage 2 -  Partial thickness loss of dermis presenting as a shallow open injury with a red, pink wound bed without slough. (Active)  12/06/22 1833  Location: Thigh  Location Orientation: Left;Medial  Staging: Stage 2 -  Partial thickness loss of dermis presenting as a shallow open injury with a red, pink wound bed without slough.  Wound Description (Comments):   Present on Admission: Yes  Dressing Type Foam - Lift dressing to assess site every shift 12/11/22 1933     Pressure Injury 12/06/22 Thigh  Left;Mid;Upper Stage 3 -  Full thickness tissue loss. Subcutaneous fat may be visible but bone, tendon or muscle are NOT exposed. (Active)  12/06/22 1700  Location: Thigh  Location Orientation: Left;Mid;Upper  Staging: Stage 3 -  Full thickness tissue loss. Subcutaneous fat may be visible but bone, tendon or muscle are NOT exposed.  Wound Description (Comments):   Present on Admission: Yes  Dressing Type Foam - Lift dressing to assess site every shift 12/11/22 1933     Pressure Injury 12/06/22 Knee Left Unstageable - Full thickness tissue loss in which the base of the injury is covered by slough (yellow, tan, gray, green or brown) and/or eschar (tan, brown or black) in the wound bed. (Active)  12/06/22 1700  Location: Knee  Location Orientation: Left  Staging: Unstageable - Full thickness tissue loss in which the base of the injury is covered by slough (yellow, tan, gray, green or brown) and/or eschar (tan, brown or black) in the wound bed.  Wound Description (Comments):   Present on Admission: Yes  Dressing Type Foam - Lift dressing to assess site every shift 12/11/22 1933     Pressure Injury 12/06/22 Heel Left;Lateral Deep Tissue Pressure Injury - Purple or maroon localized area of discolored intact skin or blood-filled blister due to damage of underlying soft tissue from pressure and/or shear. (Active)  12/06/22 1700  Location: Heel  Location Orientation: Left;Lateral  Staging: Deep Tissue Pressure Injury - Purple or maroon localized area of discolored intact skin or blood-filled blister due to damage of underlying soft tissue from pressure and/or shear.  Wound Description (Comments):   Present on Admission: Yes  Dressing Type Foam - Lift dressing to assess site every shift 12/11/22 1933     Pressure Injury 12/06/22 Heel Left;Proximal Deep Tissue Pressure Injury - Purple or maroon localized area of discolored intact skin or blood-filled blister due to damage of underlying soft tissue  from pressure and/or shear. (Active)  12/06/22 1700  Location: Heel  Location Orientation: Left;Proximal  Staging: Deep Tissue Pressure Injury - Purple or maroon localized area of discolored intact skin or blood-filled blister due to damage of underlying soft tissue from  pressure and/or shear.  Wound Description (Comments):   Present on Admission: Yes  Dressing Type Foam - Lift dressing to assess site every shift 12/11/22 1933     Pressure Injury 12/06/22 Foot Left;Posterior;Upper Deep Tissue Pressure Injury - Purple or maroon localized area of discolored intact skin or blood-filled blister due to damage of underlying soft tissue from pressure and/or shear. (Active)  12/06/22 1842  Location: Foot  Location Orientation: Left;Posterior;Upper  Staging: Deep Tissue Pressure Injury - Purple or maroon localized area of discolored intact skin or blood-filled blister due to damage of underlying soft tissue from pressure and/or shear.  Wound Description (Comments):   Present on Admission: Yes  Dressing Type Foam - Lift dressing to assess site every shift 12/11/22 1933     Pressure Injury 12/06/22 Heel Right Deep Tissue Pressure Injury - Purple or maroon localized area of discolored intact skin or blood-filled blister due to damage of underlying soft tissue from pressure and/or shear. (Active)  12/06/22 1700  Location: Heel  Location Orientation: Right  Staging: Deep Tissue Pressure Injury - Purple or maroon localized area of discolored intact skin or blood-filled blister due to damage of underlying soft tissue from pressure and/or shear.  Wound Description (Comments):   Present on Admission: Yes  Dressing Type Foam - Lift dressing to assess site every shift 12/11/22 1933     Pressure Injury 12/06/22 Foot Left;Lateral Deep Tissue Pressure Injury - Purple or maroon localized area of discolored intact skin or blood-filled blister due to damage of underlying soft tissue from pressure and/or shear.  (Active)  12/06/22 1700  Location: Foot  Location Orientation: Left;Lateral  Staging: Deep Tissue Pressure Injury - Purple or maroon localized area of discolored intact skin or blood-filled blister due to damage of underlying soft tissue from pressure and/or shear.  Wound Description (Comments):   Present on Admission: Yes  Dressing Type Foam - Lift dressing to assess site every shift 12/11/22 1933     Pressure Injury 12/06/22 Foot Left;Posterior;Upper Deep Tissue Pressure Injury - Purple or maroon localized area of discolored intact skin or blood-filled blister due to damage of underlying soft tissue from pressure and/or shear. (Active)  12/06/22 1700  Location: Foot  Location Orientation: Left;Posterior;Upper  Staging: Deep Tissue Pressure Injury - Purple or maroon localized area of discolored intact skin or blood-filled blister due to damage of underlying soft tissue from pressure and/or shear.  Wound Description (Comments):   Present on Admission: Yes  Dressing Type Foam - Lift dressing to assess site every shift 12/11/22 1933           ------------------------------------------------------------------------------------------------------------------------------    LABs:     Latest Ref Rng & Units 12/12/2022    4:58 AM 12/11/2022    4:35 AM 12/10/2022    4:10 AM  CBC  WBC 4.0 - 10.5 K/uL 13.3  13.7  12.1   Hemoglobin 13.0 - 17.0 g/dL 9.4  9.6  9.1   Hematocrit 39.0 - 52.0 % 31.5  31.2  29.6   Platelets 150 - 400 K/uL 466  446  447       Latest Ref Rng & Units 12/12/2022    4:58 AM 12/11/2022    4:35 AM 12/10/2022    4:10 AM  CMP  Glucose 70 - 99 mg/dL 94  78  78   BUN 6 - 20 mg/dL '20  21  13   '$ Creatinine 0.61 - 1.24 mg/dL 1.09  0.89  0.84   Sodium 135 - 145 mmol/L  133  134  135   Potassium 3.5 - 5.1 mmol/L 3.7  4.2  4.0   Chloride 98 - 111 mmol/L 99  100  104   CO2 22 - 32 mmol/L '26  27  23   '$ Calcium 8.9 - 10.3 mg/dL 8.7  8.7  8.3        Micro Results Recent Results  (from the past 240 hour(s))  Resp panel by RT-PCR (RSV, Flu A&B, Covid) Anterior Nasal Swab     Status: None   Collection Time: 12/06/22 10:56 AM   Specimen: Anterior Nasal Swab  Result Value Ref Range Status   SARS Coronavirus 2 by RT PCR NEGATIVE NEGATIVE Final    Comment: (NOTE) SARS-CoV-2 target nucleic acids are NOT DETECTED.  The SARS-CoV-2 RNA is generally detectable in upper respiratory specimens during the acute phase of infection. The lowest concentration of SARS-CoV-2 viral copies this assay can detect is 138 copies/mL. A negative result does not preclude SARS-Cov-2 infection and should not be used as the sole basis for treatment or other patient management decisions. A negative result may occur with  improper specimen collection/handling, submission of specimen other than nasopharyngeal swab, presence of viral mutation(s) within the areas targeted by this assay, and inadequate number of viral copies(<138 copies/mL). A negative result must be combined with clinical observations, patient history, and epidemiological information. The expected result is Negative.  Fact Sheet for Patients:  EntrepreneurPulse.com.au  Fact Sheet for Healthcare Providers:  IncredibleEmployment.be  This test is no t yet approved or cleared by the [place] FDA and  has been authorized for detection and/or diagnosis of SARS-CoV-2 by FDA under an Emergency Use Authorization (EUA). This EUA will remain  in effect (meaning this test can be used) for the duration of the COVID-19 declaration under Section 564(b)(1) of the Act, 21 U.S.C.section 360bbb-3(b)(1), unless the authorization is terminated  or revoked sooner.       Influenza A by PCR NEGATIVE NEGATIVE Final   Influenza B by PCR NEGATIVE NEGATIVE Final    Comment: (NOTE) The Xpert Xpress SARS-CoV-2/FLU/RSV plus assay is intended as an aid in the diagnosis of influenza from Nasopharyngeal swab  specimens and should not be used as a sole basis for treatment. Nasal washings and aspirates are unacceptable for Xpert Xpress SARS-CoV-2/FLU/RSV testing.  Fact Sheet for Patients: EntrepreneurPulse.com.au  Fact Sheet for Healthcare Providers: IncredibleEmployment.be  This test is not yet approved or cleared by the [place] FDA and has been authorized for detection and/or diagnosis of SARS-CoV-2 by FDA under an Emergency Use Authorization (EUA). This EUA will remain in effect (meaning this test can be used) for the duration of the COVID-19 declaration under Section 564(b)(1) of the Act, 21 U.S.C. section 360bbb-3(b)(1), unless the authorization is terminated or revoked.     Resp Syncytial Virus by PCR NEGATIVE NEGATIVE Final    Comment: (NOTE) Fact Sheet for Patients: EntrepreneurPulse.com.au  Fact Sheet for Healthcare Providers: IncredibleEmployment.be  This test is not yet approved or cleared by the [place] FDA and has been authorized for detection and/or diagnosis of SARS-CoV-2 by FDA under an Emergency Use Authorization (EUA). This EUA will remain in effect (meaning this test can be used) for the duration of the COVID-19 declaration under Section 564(b)(1) of the Act, 21 U.S.C. section 360bbb-3(b)(1), unless the authorization is terminated or revoked.  Performed at [facility], [address] [place], [place] [address]   Blood Culture (routine x 2)     Status: None  Collection Time: 12/06/22 11:17 AM   Specimen: BLOOD  Result Value Ref Range Status   Specimen Description BLOOD RIGHT ASSIST CONTROL  Final   Special Requests   Final    BOTTLES DRAWN AEROBIC AND ANAEROBIC Blood Culture results may not be optimal due to an excessive volume of blood received in culture bottles   Culture   Final    NO GROWTH 5 DAYS Performed at [facility], [address], [place], [place] [address]     Report Status 12/11/2022 FINAL  Final  Blood Culture (routine x 2)     Status: None (Preliminary result)   Collection Time: 12/06/22 11:17 AM   Specimen: BLOOD  Result Value Ref Range Status   Specimen Description   Final    BLOOD BLOOD RIGHT ARM Performed at [facility], [address] [place], [place]  [address]    Special Requests   Final    BOTTLES DRAWN AEROBIC AND ANAEROBIC Blood Culture adequate volume Performed at [facility], [address] [place], [place] [address]    Culture  Setup Time   Final    GRAM POSITIVE RODS Gram Stain Report Called to,Read Back By and Verified With: CATES LAND M ON 12/07/22 AT 2230 BY LOY,C AEROBIC BOTTLE PERFORMED AT [facility]    Culture   Final    GRAM POSITIVE RODS sent to [facility] for identification Performed at [facility], [address], [place], [place] [address]    Report Status PENDING  Incomplete  Urine Culture     Status: Abnormal   Collection Time: 12/06/22  2:00 PM   Specimen: Urine, Random  Result Value Ref Range Status   Specimen Description   Final    URINE, RANDOM Performed at [facility], [address] [place], [place] [address]    Special Requests   Final    NONE Reflexed from [id number] Performed at [facility], [address], [place],  [address]    Culture MULTIPLE SPECIES PRESENT, SUGGEST RECOLLECTION (A)  Final   Report Status 12/08/2022 FINAL  Final    Radiology Reports No results found.  SIGNED: Deatra James, MD, FHM. FAAFP. Zacarias Pontes - [facility] Time spent > 55 min.  In seeing, evaluating and examining the patient. Reviewing medical records, labs, drawn plan of care. [facility],  Pager (please use [email or web address] to page/ text) Please use Epic Secure Chat for non-urgent communication (7AM-7PM)  If 7PM-7AM, please contact night-coverage [email or web address], 12/12/2022, 11:02 AM

## 2022-12-12 NOTE — Care Management Important Message (Signed)
Important Message  Patient Details  Name: [name] MRN: [id number] Date of Birth: 03-28-1986   Medicare Important Message Given:  Yes     Tommy Medal 12/12/2022, 4:52 PM

## 2022-12-12 NOTE — TOC Progression Note (Signed)
Transition of Care [facility]) - Progression Note    Patient Details  Name: [name] MRN: [id number] Date of Birth: May 23, 1986  [facility] CM/SW Contact  Joaquin Courts, RN Phone Number: 12/12/2022, 2:41 PM  Clinical Narrative:    [facility] has extended a bed offer, pending insurance authorization.   Expected Discharge Plan: [place] Barriers to Discharge: Continued Medical Work up  Expected Discharge Plan and Services                                               Social Determinants of Health (SDOH) Interventions SDOH Screenings   Alcohol Screen: Medium Risk (07/22/2018)  Tobacco Use: High Risk (12/06/2022)    Readmission Risk Interventions    02/15/2022    2:35 PM 08/24/2021    4:00 PM 08/24/2021   11:54 AM  Readmission Risk Prevention Plan  Transportation Screening Complete Complete   PCP or Specialist Appt within 3-5 Days  Complete Complete  HRI or [place]  Complete Complete  Social Work Consult for [place] Planning/Counseling   Complete  Palliative Care Screening  Complete Complete  Medication Review Press photographer) Complete Complete Complete  PCP or Specialist appointment within 3-5 days of discharge Complete    HRI or [place] Complete    SW Recovery Care/Counseling Consult Complete    [place] Not Applicable

## 2022-12-12 NOTE — Progress Notes (Signed)
OT Cancellation Note  Patient Details Name: [name] MRN: [id number] DOB: [birth year]   Cancelled Treatment:    Reason Eval/Treat Not Completed: Other (comment). Pt had just started eating as this therapist entered the room. Will attempt to treat pt later as time permits.   Kaneisha Ellenberger OT, MOT   Larey Seat 12/12/2022, 8:44 AM

## 2022-12-12 NOTE — Progress Notes (Signed)
   12/12/22 2247  Assess: MEWS Score  Temp 97.7 F (36.5 C)  BP 102/73  MAP (mmHg) 82  Pulse Rate (!) 117  Resp 18  SpO2 100 %  O2 Device Room Air  Assess: MEWS Score  MEWS Temp 0  MEWS Systolic 0  MEWS Pulse 2  MEWS RR 0  MEWS LOC 0  MEWS Score 2  MEWS Score Color Yellow  Assess: if the MEWS score is Yellow or Red  Were vital signs taken at a resting state? Yes  Focused Assessment No change from prior assessment  Does the patient meet 2 or more of the SIRS criteria? No  Does the patient have a confirmed or suspected source of infection? No  Provider and Rapid Response Notified? No  MEWS guidelines implemented  Yes, yellow  Treat  MEWS Interventions Considered administering scheduled or prn medications/treatments as ordered  Take Vital Signs  Increase Vital Sign Frequency  Yellow: Q2hr x1, continue Q4hrs until patient remains green for 12hrs  Escalate  MEWS: Escalate Yellow: Discuss with charge nurse and consider notifying provider and/or RRT  Notify: Charge Nurse/RN  Name of Charge Nurse/RN Notified Sully, RN  Assess: SIRS CRITERIA  SIRS Temperature  0  SIRS Pulse 1  SIRS Respirations  0  SIRS WBC 0  SIRS Score Sum  1

## 2022-12-12 NOTE — TOC Progression Note (Signed)
Transition of Care [facility]) - Progression Note    Patient Details  Name: [name] MRN: [id number] Date of Birth: May 04, 1986  Transition of Care [facility]) CM/SW Contact  Joaquin Courts, RN Phone Number: 12/12/2022, 1:35 PM  Clinical Narrative:    Patient remains under review with [place] group, spoke with Corporate liaison and provided additional information, patient's medicaid is with [employer], [place] worker is Rojelio Brenner [phone], email is [email or web address] .     Expected Discharge Plan: [place] Barriers to Discharge: Continued Medical Work up  Expected Discharge Plan and Services                                               Social Determinants of Health (SDOH) Interventions SDOH Screenings   Alcohol Screen: Medium Risk (07/22/2018)  Tobacco Use: High Risk (12/06/2022)    Readmission Risk Interventions    02/15/2022    2:35 PM 08/24/2021    4:00 PM 08/24/2021   11:54 AM  Readmission Risk Prevention Plan  Transportation Screening Complete Complete   PCP or Specialist Appt within 3-5 Days  Complete Complete  HRI or [place]  Complete Complete  Social Work Consult for [place] Planning/Counseling   Complete  Palliative Care Screening  Complete Complete  Medication Review Press photographer) Complete Complete Complete  PCP or Specialist appointment within 3-5 days of discharge Complete    HRI or [place] Complete    SW Recovery Care/Counseling Consult Complete    [place] Not Applicable

## 2022-12-12 NOTE — Progress Notes (Signed)
Patients dressings changed during shift today at 1630. Each area cleaned with soap and water, applied Aquacel AG to open areas and covered. Patient reported some complaints while changing lower leg dressings. Patient refused to ambulate or sit up in chair today. MD Shahmehdi made aware. Prevalon boots still in place.

## 2022-12-12 NOTE — Progress Notes (Signed)
Pt reported pruritus, PRN benadryl given. Pt slept through the night. Vitals stable.

## 2022-12-13 LAB — URINALYSIS, ROUTINE W REFLEX MICROSCOPIC
Bacteria, UA: NONE SEEN
Bilirubin Urine: NEGATIVE
Glucose, UA: NEGATIVE mg/dL
Hgb urine dipstick: NEGATIVE
Ketones, ur: 5 mg/dL — AB
Nitrite: NEGATIVE
Protein, ur: 30 mg/dL — AB
Specific Gravity, Urine: 1.024 (ref 1.005–1.030)
pH: 5 (ref 5.0–8.0)

## 2022-12-13 LAB — CBC
HCT: 29.5 % — ABNORMAL LOW (ref 39.0–52.0)
Hemoglobin: 9 g/dL — ABNORMAL LOW (ref 13.0–17.0)
MCH: 28.3 pg (ref 26.0–34.0)
MCHC: 30.5 g/dL (ref 30.0–36.0)
MCV: 92.8 fL (ref 80.0–100.0)
Platelets: 475 10*3/uL — ABNORMAL HIGH (ref 150–400)
RBC: 3.18 MIL/uL — ABNORMAL LOW (ref 4.22–5.81)
RDW: 15.7 % — ABNORMAL HIGH (ref 11.5–15.5)
WBC: 12.8 10*3/uL — ABNORMAL HIGH (ref 4.0–10.5)
nRBC: 0 % (ref 0.0–0.2)

## 2022-12-13 MED ORDER — SODIUM CHLORIDE 0.9 % IV BOLUS
500.0000 mL | Freq: Once | INTRAVENOUS | Status: AC
  Administered 2022-12-13: 500 mL via INTRAVENOUS

## 2022-12-13 MED ORDER — CARVEDILOL 3.125 MG PO TABS
3.1250 mg | ORAL_TABLET | Freq: Two times a day (BID) | ORAL | Status: DC
  Administered 2022-12-13: 3.125 mg via ORAL
  Filled 2022-12-13 (×2): qty 1

## 2022-12-13 MED ORDER — AMOXICILLIN-POT CLAVULANATE 875-125 MG PO TABS
1.0000 | ORAL_TABLET | Freq: Two times a day (BID) | ORAL | Status: AC
  Administered 2022-12-13 – 2022-12-14 (×3): 1 via ORAL
  Filled 2022-12-13 (×3): qty 1

## 2022-12-13 MED ORDER — CYCLOBENZAPRINE HCL 10 MG PO TABS: 5.0000 mg | ORAL_TABLET | Freq: Three times a day (TID) | ORAL | Status: DC | PRN

## 2022-12-13 MED ORDER — TRAZODONE HCL 50 MG PO TABS
100.0000 mg | ORAL_TABLET | Freq: Every day | ORAL | Status: DC
  Administered 2022-12-13 – 2022-12-14 (×2): 100 mg via ORAL
  Filled 2022-12-13 (×2): qty 2

## 2022-12-13 MED ORDER — MIDODRINE HCL 5 MG PO TABS
15.0000 mg | ORAL_TABLET | Freq: Three times a day (TID) | ORAL | Status: DC
  Administered 2022-12-13 – 2022-12-15 (×7): 15 mg via ORAL
  Filled 2022-12-13 (×7): qty 3

## 2022-12-13 NOTE — Progress Notes (Signed)
   12/13/22 1612  Assess: MEWS Score  Temp 98.8 F (37.1 C)  BP 93/74  MAP (mmHg) 82  Pulse Rate (!) 120 (MD Shahmehdi made aware of patients increased HR)  SpO2 100 %  Assess: MEWS Score  MEWS Temp 0  MEWS Systolic 1  MEWS Pulse 2  MEWS RR 0  MEWS LOC 0  MEWS Score 3  MEWS Score Color Yellow  Assess: if the MEWS score is Yellow or Red  Were vital signs taken at a resting state? Yes  Focused Assessment No change from prior assessment  Does the patient meet 2 or more of the SIRS criteria? No  Does the patient have a confirmed or suspected source of infection? Yes  Provider and Rapid Response Notified? No (MD made aware.)  MEWS guidelines implemented  No, previously yellow, continue vital signs every 4 hours  Notify: Charge Nurse/RN  Name of Charge Nurse/RN Notified Ramos RN  Provider Notification  Provider Name/Title Skipper Cliche MD  Date Provider Notified 12/13/22  Time Provider Notified 1623  Method of Notification Page (Secure chat)  Notification Reason Other (Comment) (yellow MEWs)  Date of Provider Response 12/13/22  Assess: SIRS CRITERIA  SIRS Temperature  0  SIRS Pulse 1  SIRS Respirations  0  SIRS WBC 0  SIRS Score Sum  1

## 2022-12-13 NOTE — TOC Progression Note (Addendum)
Transition of Care [facility]) - Progression Note    Patient Details  Name: [name] MRN: [id number] Date of Birth: [birth year]  [facility]) CM/SW Contact  Boneta Lucks, RN Phone Number: 12/13/2022, 3:43 PM  Clinical Narrative:   Thera Flake with [facility] called to get an update on patient. They also have an open case. Please call to updated them with discharge at [phone] ext [phone]    Expected Discharge Plan: [place] Barriers to Discharge: Continued Medical Work up

## 2022-12-13 NOTE — Progress Notes (Signed)
PROGRESS NOTE    Patient: [name]                            PCP: [name], MD                    DOB: 1986/09/25            DOA: 12/06/2022 [id number]             DOS: 12/13/2022, 1:00 PM   LOS: 7 days   Date of Service: The patient was seen and examined on 12/13/2022  Subjective:    The patient was seen and examined this morning, awake alert oriented, comfortable in bed.  No issues overnight Patient is mildly hypotensive, tachycardic this morning   S/P 2  unit PRBC transfusion on 12/10/2022 Noted for drop in H&H from 7.7--->7.0>>> 6.5 >>> 9.4  No reported bleeding by patient or nursing staff  Per social worker we have that all for now, pending insurance approval  Brief Narrative:   [name] is  a 37 year old male apparently bedbound from home with significant history of HIV,  , HTN, FTT, Chronic hidradenitis suppurativa involving groin creases, pubic region, buttocks , Anemia of chronic disease, alcoholic liver cirrhosis, GERD and history of orthostatic hypotension in the setting of autonomic dysfunction on chronic decubitus ulcers in the setting of hidradenitis suppurativa presents from home by EMS ??  Altered mentation in the setting of poor oral intake .   Assessment & Plan:   Principal Problem:   Acute metabolic encephalopathy Active Problems:   UTI (urinary tract infection)   Protein-calorie malnutrition, severe (HCC)   GERD (gastroesophageal reflux disease)   HIV disease (HCC)   Anemia   Avascular necrosis of femoral head, left (HCC)   FTT (failure to thrive) in adult   Alcoholic cirrhosis of liver (HCC)   Bipolar disorder (HCC)   Acute metabolic encephalopathy- --resolved, mentation at baseline  -Altered mental status likely related to: UTI, proctitis, dehydration -Multiple hospitalization, -COVID,  flu and RSV negative -Hydrate and treat infection    Acute on chronic anemia - Anemia of chronic disease -No active signs of  bleeding -Hemoglobin 6.5 on 12/09/2022 -Likely multifactorial-exacerbated by proctitis, chronic groin wounds, hemodilution    Latest Ref Rng & Units 12/13/2022    8:39 AM 12/12/2022    4:58 AM 12/11/2022    4:35 AM  CBC  WBC 4.0 - 10.5 K/uL 12.8  13.3  13.7   Hemoglobin 13.0 - 17.0 g/dL 9.0  9.4  9.6   Hematocrit 39.0 - 52.0 % 29.5  31.5  31.2   Platelets 150 - 400 K/uL 475  466  446    -Transfusing 2U PRBC 12/10/2022  -Obtaining iron studies, Hemoccult, B12, folate -Discontinue heparin for DVT prophylaxis  proctitis--- patient received cefepime and Vanco in the ED --CT abdomen pelvis with possible proctitis - WBC is up to 14.4 >>>9.7 >>12.1  -  Lactic acid is 2.5 Treat empirically with Rocephin and Flagyl >> 12/12/2022 switching to p.o. Augmentin for 3 more days concluding 10 days of antibiotics   UTI-- +ve Dysuria, --UA suggestive of UTI -WBC is up to 14.4, 12.1  - Lactic acid is 2.5 >> 1.8  - IV Rocephin >> switch to p.o. Augmentin 12/12/2022 for 3 more days concluding 10 days of antibiotics   HIV, on HAART Cont home meds.  Biktarvy -Patient [facility]  Failure to thrive in adult -Bedbound, -Multiple attempts to ambulate patient unable to hold steady gait or take steps -Give Ensure nutritional supplements Liberalize diet.  On folic acid, multivitamin, thiamine   Bipolar 1 disorder/Schizoaffective disorder/PTSD/Depression/anxiety-- Resume home regimen-including Restoril, trazodone, Lexapro and Latuda -Xanax as ordered -History of noncompliance with medications   Orthostatic hypotension/autonomic dysfunction -Patient is bedbound -- Increased Midodrine to 15 mg p.o. 3 times daily AC   Hypotensive  -Recent midodrine, IV fluid as needed Monitoring closely  Tachyarrhythmia -Currently normal sinus, tachycardia -BP soft but adding small dose of beta-blockers   GERD -Pepcid as ordered   Chronic hidradenitis suppurativa involving groin creases, pubic  region, buttocks --see photos in epic -Decubitus ulcers-noted they do not look infected at this time -Some of the wounds are deep and tracking   Status is: Inpatient   Remains inpatient appropriate because:    Dispo: The patient is from: Home              Anticipated d/c is to: SNF -patient is on difficulty discharge list              Anticipated d/c date is: 2 days              Patient currently is not medically stable to d/c.  Disposition-patient has finally a bed offer, pending insurance approval   Patient remains on difficulty discharge list   Barriers: Clinically Stable now -also difficult to discharge, mom unable to take care of him at home, he is now bedbound, needing assist with all ADLs   ------------------------------------------------------------------------------------------------------------------------- Nutritional status:  The patient's BMI is: Body mass index is 18.46 kg/m. I agree with the assessment and plan as outlined below: Nutrition Status: Nutrition Problem: Severe Malnutrition Etiology: chronic illness, catabolic illness (cirrhosis, HIV, numerous chronic pressure injuries) Signs/Symptoms: energy intake < or equal to 75% for > or equal to 1 month, percent weight loss Percent weight loss: 17 % (x 3 months and 39 % loss x 27 months) Interventions: Boost Plus, Liberalize Diet, Education, Prostat, Juven (vitamin c and thiamine daily)     Skin Assessment: I have examined the patient's skin and I agree with the wound assessment as performed by wound care team As outlined belowe: Pressure Injury 08/27/22 Buttocks Bilateral Stage 2 -  Partial thickness loss of dermis presenting as a shallow open injury with a red, pink wound bed without slough. (Active)  08/27/22   Location: Buttocks  Location Orientation: Bilateral  Staging: Stage 2 -  Partial thickness loss of dermis presenting as a shallow open injury with a red, pink wound bed without slough.  Wound  Description (Comments):   Present on Admission: Yes     Pressure Injury 12/06/22 Buttocks Right;Distal Stage 3 -  Full thickness tissue loss. Subcutaneous fat may be visible but bone, tendon or muscle are NOT exposed. (Active)  12/06/22 1826  Location: Buttocks  Location Orientation: Right;Distal  Staging: Stage 3 -  Full thickness tissue loss. Subcutaneous fat may be visible but bone, tendon or muscle are NOT exposed.  Wound Description (Comments):   Present on Admission: Yes  Dressing Type Foam - Lift dressing to assess site every shift 12/13/22 0830     Pressure Injury 12/06/22 Thigh Right;Upper Stage 3 -  Full thickness tissue loss. Subcutaneous fat may be visible but bone, tendon or muscle are NOT exposed. (Active)  12/06/22 1700  Location: Thigh  Location Orientation: Right;Upper  Staging: Stage 3 -  Full thickness tissue loss. Subcutaneous  fat may be visible but bone, tendon or muscle are NOT exposed.  Wound Description (Comments):   Present on Admission: Yes  Dressing Type Foam - Lift dressing to assess site every shift 12/13/22 0830     Pressure Injury 12/06/22 Hip Right Stage 2 -  Partial thickness loss of dermis presenting as a shallow open injury with a red, pink wound bed without slough. (Active)  12/06/22 1700  Location: Hip  Location Orientation: Right  Staging: Stage 2 -  Partial thickness loss of dermis presenting as a shallow open injury with a red, pink wound bed without slough.  Wound Description (Comments):   Present on Admission: Yes  Dressing Type Foam - Lift dressing to assess site every shift 12/13/22 0830     Pressure Injury 12/06/22 Sacrum Right Stage 3 -  Full thickness tissue loss. Subcutaneous fat may be visible but bone, tendon or muscle are NOT exposed. (Active)  12/06/22 1700  Location: Sacrum  Location Orientation: Right  Staging: Stage 3 -  Full thickness tissue loss. Subcutaneous fat may be visible but bone, tendon or muscle are NOT exposed.   Wound Description (Comments):   Present on Admission: Yes  Dressing Type Foam - Lift dressing to assess site every shift 12/13/22 0830     Pressure Injury 12/06/22 Thigh Left;Medial Stage 2 -  Partial thickness loss of dermis presenting as a shallow open injury with a red, pink wound bed without slough. (Active)  12/06/22 1833  Location: Thigh  Location Orientation: Left;Medial  Staging: Stage 2 -  Partial thickness loss of dermis presenting as a shallow open injury with a red, pink wound bed without slough.  Wound Description (Comments):   Present on Admission: Yes  Dressing Type Foam - Lift dressing to assess site every shift 12/13/22 0830     Pressure Injury 12/06/22 Thigh Left;Mid;Upper Stage 3 -  Full thickness tissue loss. Subcutaneous fat may be visible but bone, tendon or muscle are NOT exposed. (Active)  12/06/22 1700  Location: Thigh  Location Orientation: Left;Mid;Upper  Staging: Stage 3 -  Full thickness tissue loss. Subcutaneous fat may be visible but bone, tendon or muscle are NOT exposed.  Wound Description (Comments):   Present on Admission: Yes  Dressing Type Foam - Lift dressing to assess site every shift 12/13/22 0830     Pressure Injury 12/06/22 Knee Left Unstageable - Full thickness tissue loss in which the base of the injury is covered by slough (yellow, tan, gray, green or brown) and/or eschar (tan, brown or black) in the wound bed. (Active)  12/06/22 1700  Location: Knee  Location Orientation: Left  Staging: Unstageable - Full thickness tissue loss in which the base of the injury is covered by slough (yellow, tan, gray, green or brown) and/or eschar (tan, brown or black) in the wound bed.  Wound Description (Comments):   Present on Admission: Yes  Dressing Type Foam - Lift dressing to assess site every shift 12/13/22 0830     Pressure Injury 12/06/22 Heel Left;Lateral Deep Tissue Pressure Injury - Purple or maroon localized area of discolored intact skin or  blood-filled blister due to damage of underlying soft tissue from pressure and/or shear. (Active)  12/06/22 1700  Location: Heel  Location Orientation: Left;Lateral  Staging: Deep Tissue Pressure Injury - Purple or maroon localized area of discolored intact skin or blood-filled blister due to damage of underlying soft tissue from pressure and/or shear.  Wound Description (Comments):   Present on Admission: Yes  Dressing  Type Foam - Lift dressing to assess site every shift 12/13/22 0830     Pressure Injury 12/06/22 Heel Left;Proximal Deep Tissue Pressure Injury - Purple or maroon localized area of discolored intact skin or blood-filled blister due to damage of underlying soft tissue from pressure and/or shear. (Active)  12/06/22 1700  Location: Heel  Location Orientation: Left;Proximal  Staging: Deep Tissue Pressure Injury - Purple or maroon localized area of discolored intact skin or blood-filled blister due to damage of underlying soft tissue from pressure and/or shear.  Wound Description (Comments):   Present on Admission: Yes  Dressing Type Foam - Lift dressing to assess site every shift 12/13/22 0830     Pressure Injury 12/06/22 Foot Left;Posterior;Upper Deep Tissue Pressure Injury - Purple or maroon localized area of discolored intact skin or blood-filled blister due to damage of underlying soft tissue from pressure and/or shear. (Active)  12/06/22 1842  Location: Foot  Location Orientation: Left;Posterior;Upper  Staging: Deep Tissue Pressure Injury - Purple or maroon localized area of discolored intact skin or blood-filled blister due to damage of underlying soft tissue from pressure and/or shear.  Wound Description (Comments):   Present on Admission: Yes  Dressing Type Foam - Lift dressing to assess site every shift 12/13/22 0830     Pressure Injury 12/06/22 Heel Right Deep Tissue Pressure Injury - Purple or maroon localized area of discolored intact skin or blood-filled blister due  to damage of underlying soft tissue from pressure and/or shear. (Active)  12/06/22 1700  Location: Heel  Location Orientation: Right  Staging: Deep Tissue Pressure Injury - Purple or maroon localized area of discolored intact skin or blood-filled blister due to damage of underlying soft tissue from pressure and/or shear.  Wound Description (Comments):   Present on Admission: Yes  Dressing Type Foam - Lift dressing to assess site every shift 12/13/22 0830     Pressure Injury 12/06/22 Foot Left;Lateral Deep Tissue Pressure Injury - Purple or maroon localized area of discolored intact skin or blood-filled blister due to damage of underlying soft tissue from pressure and/or shear. (Active)  12/06/22 1700  Location: Foot  Location Orientation: Left;Lateral  Staging: Deep Tissue Pressure Injury - Purple or maroon localized area of discolored intact skin or blood-filled blister due to damage of underlying soft tissue from pressure and/or shear.  Wound Description (Comments):   Present on Admission: Yes  Dressing Type Foam - Lift dressing to assess site every shift 12/13/22 0830     Pressure Injury 12/06/22 Foot Left;Posterior;Upper Deep Tissue Pressure Injury - Purple or maroon localized area of discolored intact skin or blood-filled blister due to damage of underlying soft tissue from pressure and/or shear. (Active)  12/06/22 1700  Location: Foot  Location Orientation: Left;Posterior;Upper  Staging: Deep Tissue Pressure Injury - Purple or maroon localized area of discolored intact skin or blood-filled blister due to damage of underlying soft tissue from pressure and/or shear.  Wound Description (Comments):   Present on Admission: Yes  Dressing Type Foam - Lift dressing to assess site every shift 12/13/22 0830     ----------------------------------------------------------------------------------------------------------------------- Cultures; Blood Cultures x 2 >>  NGT -------------------------------------------------------------------------------------------------------------------------  DVT prophylaxis:  enoxaparin (LOVENOX) injection 40 mg Start: 12/11/22 1600 Place and maintain sequential compression device Start: 12/08/22 1215 SCDs Start: 12/06/22 1630   Code Status:   Code Status: Full Code  Family Communication: Called his mother Mrs. West Carbo 12/11/22  -Advance care planning has been discussed.   Admission status:   Status is: Inpatient Remains inpatient  appropriate because: Eating IV fluids, IV antibiotics,   Disposition: From  - home             Planning for discharge in 1-2 days: to SNF   Procedures:   No admission procedures for hospital encounter.   Antimicrobials:  Anti-infectives (From admission, onward)    Start     Dose/Rate Route Frequency Ordered Stop   12/13/22 2200  amoxicillin-clavulanate (AUGMENTIN) 875-125 MG per tablet 1 tablet        1 tablet Oral Every 12 hours 12/13/22 0821 12/15/22 0959   12/12/22 1145  amoxicillin-clavulanate (AUGMENTIN) 875-125 MG per tablet 1 tablet        1 tablet Oral Every 12 hours 12/12/22 1058 12/13/22 0819   12/08/22 2200  metroNIDAZOLE (FLAGYL) tablet 500 mg  Status:  Discontinued        500 mg Oral Every 8 hours 12/08/22 1212 12/12/22 1057   12/07/22 0200  vancomycin (VANCOREADY) IVPB 750 mg/150 mL        750 mg 150 mL/hr over 60 Minutes Intravenous Every 12 hours 12/06/22 1409 12/07/22 0215   12/06/22 2200  cefTRIAXone (ROCEPHIN) 1 g in sodium chloride 0.9 % 100 mL IVPB  Status:  Discontinued        1 g 200 mL/hr over 30 Minutes Intravenous Every 24 hours 12/06/22 1844 12/12/22 1058   12/06/22 2200  metroNIDAZOLE (FLAGYL) IVPB 500 mg  Status:  Discontinued        500 mg 100 mL/hr over 60 Minutes Intravenous Every 12 hours 12/06/22 1845 12/08/22 1212   12/06/22 2100  ceFEPIme (MAXIPIME) 2 g in sodium chloride 0.9 % 100 mL IVPB  Status:  Discontinued        2 g 200  mL/hr over 30 Minutes Intravenous Every 8 hours 12/06/22 1314 12/06/22 1844   12/06/22 1800  bictegravir-emtricitabine-tenofovir AF (BIKTARVY) 50-200-25 MG per tablet 1 tablet        1 tablet Oral Daily 12/06/22 1633     12/06/22 1800  valACYclovir (VALTREX) tablet 1,000 mg        1,000 mg Oral Daily 12/06/22 1633     12/06/22 1300  vancomycin (VANCOREADY) IVPB 1500 mg/300 mL        1,500 mg 150 mL/hr over 120 Minutes Intravenous  Once 12/06/22 1245 12/06/22 1555   12/06/22 1230  ceFEPIme (MAXIPIME) 2 g in sodium chloride 0.9 % 100 mL IVPB        2 g 200 mL/hr over 30 Minutes Intravenous  Once 12/06/22 1229 12/06/22 1315   12/06/22 1230  metroNIDAZOLE (FLAGYL) IVPB 500 mg        500 mg 100 mL/hr over 60 Minutes Intravenous  Once 12/06/22 1229 12/06/22 1505   12/06/22 1230  vancomycin (VANCOCIN) IVPB 1000 mg/200 mL premix  Status:  Discontinued        1,000 mg 200 mL/hr over 60 Minutes Intravenous  Once 12/06/22 1229 12/06/22 1245        Medication:   (feeding supplement) PROSource Plus  30 mL Oral BID BM   amoxicillin-clavulanate  1 tablet Oral Q12H   ascorbic acid  500 mg Oral Daily   bictegravir-emtricitabine-tenofovir AF  1 tablet Oral Daily   carvedilol  3.125 mg Oral BID WC   enoxaparin (LOVENOX) injection  40 mg Subcutaneous Q24H   escitalopram  20 mg Oral Daily   feeding supplement  237 mL Oral BID BM   ferrous sulfate  325 mg Oral Q breakfast  gabapentin  100 mg Oral BID   lactobacillus  1 g Oral TID   lurasidone  120 mg Oral Daily   midodrine  15 mg Oral TID WC   multivitamin with minerals  1 tablet Oral Daily   nicotine  7 mg Transdermal Daily   nutrition supplement (JUVEN)  1 packet Oral BID BM   senna-docusate  2 tablet Oral QHS   sodium chloride flush  3 mL Intravenous Q12H   temazepam  30 mg Oral QHS   thiamine  100 mg Oral Daily   trazodone  150 mg Oral QHS   valACYclovir  1,000 mg Oral Daily    acetaminophen **OR** acetaminophen, ALPRAZolam, bisacodyl,  cyclobenzaprine, diphenhydrAMINE, diphenoxylate-atropine, HYDROcodone-acetaminophen, ondansetron, polyethylene glycol   Objective:   Vitals:   12/13/22 0728 12/13/22 1030 12/13/22 1148 12/13/22 1203  BP: (!) 84/60 97/73 (!) 89/63 93/72  Pulse: (!) 110 100 (!) 115 (!) 111  Resp: '18  18 18  '$ Temp: 98.7 F (37.1 C)  98.5 F (36.9 C) 98 F (36.7 C)  TempSrc:    Oral  SpO2: 100%  100% 100%  Weight:      Height:        Intake/Output Summary (Last 24 hours) at 12/13/2022 1300 Last data filed at 12/13/2022 1119 Gross per 24 hour  Intake 600 ml  Output 2550 ml  Net -1950 ml   Filed Weights   12/06/22 1010  Weight: 67 kg     Physical examination:        General:  AAO x 3,  cooperative, no distress;   HEENT:  Normocephalic, PERRL, otherwise with in Normal limits   Neuro:  CNII-XII intact. , normal motor and sensation, reflexes intact   Lungs:   Clear to auscultation BL, Respirations unlabored,  No wheezes / crackles  Cardio:    S1/S2, RRR, No murmure, No Rubs or Gallops   Abdomen:  Soft, non-tender, bowel sounds active all four quadrants, no guarding or peritoneal signs.  Muscular  skeletal:  Bedbound unable to ambulate without assist Limited exam -severe global generalized weaknesses - in bed, able to move all 4 extremities,   2+ pulses,  symmetric, No pitting edema  Skin:  Dry, warm to touch, significant groin, bilateral buttocks lower extremity wound please see description as below and pictures in media  Wounds: Please see nursing documentation  Pressure Injury 08/27/22 Buttocks Bilateral Stage 2 -  Partial thickness loss of dermis presenting as a shallow open injury with a red, pink wound bed without slough. (Active)  08/27/22   Location: Buttocks  Location Orientation: Bilateral  Staging: Stage 2 -  Partial thickness loss of dermis presenting as a shallow open injury with a red, pink wound bed without slough.  Wound Description (Comments):   Present on Admission: Yes      Pressure Injury 12/06/22 Buttocks Right;Distal Stage 3 -  Full thickness tissue loss. Subcutaneous fat may be visible but bone, tendon or muscle are NOT exposed. (Active)  12/06/22 1826  Location: Buttocks  Location Orientation: Right;Distal  Staging: Stage 3 -  Full thickness tissue loss. Subcutaneous fat may be visible but bone, tendon or muscle are NOT exposed.  Wound Description (Comments):   Present on Admission: Yes  Dressing Type Foam - Lift dressing to assess site every shift 12/13/22 0830     Pressure Injury 12/06/22 Thigh Right;Upper Stage 3 -  Full thickness tissue loss. Subcutaneous fat may be visible but bone, tendon or muscle are NOT exposed. (Active)  12/06/22 1700  Location: Thigh  Location Orientation: Right;Upper  Staging: Stage 3 -  Full thickness tissue loss. Subcutaneous fat may be visible but bone, tendon or muscle are NOT exposed.  Wound Description (Comments):   Present on Admission: Yes  Dressing Type Foam - Lift dressing to assess site every shift 12/13/22 0830     Pressure Injury 12/06/22 Hip Right Stage 2 -  Partial thickness loss of dermis presenting as a shallow open injury with a red, pink wound bed without slough. (Active)  12/06/22 1700  Location: Hip  Location Orientation: Right  Staging: Stage 2 -  Partial thickness loss of dermis presenting as a shallow open injury with a red, pink wound bed without slough.  Wound Description (Comments):   Present on Admission: Yes  Dressing Type Foam - Lift dressing to assess site every shift 12/13/22 0830     Pressure Injury 12/06/22 Sacrum Right Stage 3 -  Full thickness tissue loss. Subcutaneous fat may be visible but bone, tendon or muscle are NOT exposed. (Active)  12/06/22 1700  Location: Sacrum  Location Orientation: Right  Staging: Stage 3 -  Full thickness tissue loss. Subcutaneous fat may be visible but bone, tendon or muscle are NOT exposed.  Wound Description (Comments):   Present on Admission:  Yes  Dressing Type Foam - Lift dressing to assess site every shift 12/13/22 0830     Pressure Injury 12/06/22 Thigh Left;Medial Stage 2 -  Partial thickness loss of dermis presenting as a shallow open injury with a red, pink wound bed without slough. (Active)  12/06/22 1833  Location: Thigh  Location Orientation: Left;Medial  Staging: Stage 2 -  Partial thickness loss of dermis presenting as a shallow open injury with a red, pink wound bed without slough.  Wound Description (Comments):   Present on Admission: Yes  Dressing Type Foam - Lift dressing to assess site every shift 12/13/22 0830     Pressure Injury 12/06/22 Thigh Left;Mid;Upper Stage 3 -  Full thickness tissue loss. Subcutaneous fat may be visible but bone, tendon or muscle are NOT exposed. (Active)  12/06/22 1700  Location: Thigh  Location Orientation: Left;Mid;Upper  Staging: Stage 3 -  Full thickness tissue loss. Subcutaneous fat may be visible but bone, tendon or muscle are NOT exposed.  Wound Description (Comments):   Present on Admission: Yes  Dressing Type Foam - Lift dressing to assess site every shift 12/13/22 0830     Pressure Injury 12/06/22 Knee Left Unstageable - Full thickness tissue loss in which the base of the injury is covered by slough (yellow, tan, gray, green or brown) and/or eschar (tan, brown or black) in the wound bed. (Active)  12/06/22 1700  Location: Knee  Location Orientation: Left  Staging: Unstageable - Full thickness tissue loss in which the base of the injury is covered by slough (yellow, tan, gray, green or brown) and/or eschar (tan, brown or black) in the wound bed.  Wound Description (Comments):   Present on Admission: Yes  Dressing Type Foam - Lift dressing to assess site every shift 12/13/22 0830     Pressure Injury 12/06/22 Heel Left;Lateral Deep Tissue Pressure Injury - Purple or maroon localized area of discolored intact skin or blood-filled blister due to damage of underlying soft  tissue from pressure and/or shear. (Active)  12/06/22 1700  Location: Heel  Location Orientation: Left;Lateral  Staging: Deep Tissue Pressure Injury - Purple or maroon localized area of discolored intact skin or blood-filled blister due to damage  of underlying soft tissue from pressure and/or shear.  Wound Description (Comments):   Present on Admission: Yes  Dressing Type Foam - Lift dressing to assess site every shift 12/13/22 0830     Pressure Injury 12/06/22 Heel Left;Proximal Deep Tissue Pressure Injury - Purple or maroon localized area of discolored intact skin or blood-filled blister due to damage of underlying soft tissue from pressure and/or shear. (Active)  12/06/22 1700  Location: Heel  Location Orientation: Left;Proximal  Staging: Deep Tissue Pressure Injury - Purple or maroon localized area of discolored intact skin or blood-filled blister due to damage of underlying soft tissue from pressure and/or shear.  Wound Description (Comments):   Present on Admission: Yes  Dressing Type Foam - Lift dressing to assess site every shift 12/13/22 0830     Pressure Injury 12/06/22 Foot Left;Posterior;Upper Deep Tissue Pressure Injury - Purple or maroon localized area of discolored intact skin or blood-filled blister due to damage of underlying soft tissue from pressure and/or shear. (Active)  12/06/22 1842  Location: Foot  Location Orientation: Left;Posterior;Upper  Staging: Deep Tissue Pressure Injury - Purple or maroon localized area of discolored intact skin or blood-filled blister due to damage of underlying soft tissue from pressure and/or shear.  Wound Description (Comments):   Present on Admission: Yes  Dressing Type Foam - Lift dressing to assess site every shift 12/13/22 0830     Pressure Injury 12/06/22 Heel Right Deep Tissue Pressure Injury - Purple or maroon localized area of discolored intact skin or blood-filled blister due to damage of underlying soft tissue from pressure  and/or shear. (Active)  12/06/22 1700  Location: Heel  Location Orientation: Right  Staging: Deep Tissue Pressure Injury - Purple or maroon localized area of discolored intact skin or blood-filled blister due to damage of underlying soft tissue from pressure and/or shear.  Wound Description (Comments):   Present on Admission: Yes  Dressing Type Foam - Lift dressing to assess site every shift 12/13/22 0830     Pressure Injury 12/06/22 Foot Left;Lateral Deep Tissue Pressure Injury - Purple or maroon localized area of discolored intact skin or blood-filled blister due to damage of underlying soft tissue from pressure and/or shear. (Active)  12/06/22 1700  Location: Foot  Location Orientation: Left;Lateral  Staging: Deep Tissue Pressure Injury - Purple or maroon localized area of discolored intact skin or blood-filled blister due to damage of underlying soft tissue from pressure and/or shear.  Wound Description (Comments):   Present on Admission: Yes  Dressing Type Foam - Lift dressing to assess site every shift 12/13/22 0830     Pressure Injury 12/06/22 Foot Left;Posterior;Upper Deep Tissue Pressure Injury - Purple or maroon localized area of discolored intact skin or blood-filled blister due to damage of underlying soft tissue from pressure and/or shear. (Active)  12/06/22 1700  Location: Foot  Location Orientation: Left;Posterior;Upper  Staging: Deep Tissue Pressure Injury - Purple or maroon localized area of discolored intact skin or blood-filled blister due to damage of underlying soft tissue from pressure and/or shear.  Wound Description (Comments):   Present on Admission: Yes  Dressing Type Foam - Lift dressing to assess site every shift 12/13/22 0830               ------------------------------------------------------------------------------------------------------------------------------    LABs:     Latest Ref Rng & Units 12/13/2022    8:39 AM 12/12/2022    4:58 AM  12/11/2022    4:35 AM  CBC  WBC 4.0 - 10.5 K/uL 12.8  13.3  13.7   Hemoglobin 13.0 - 17.0 g/dL 9.0  9.4  9.6   Hematocrit 39.0 - 52.0 % 29.5  31.5  31.2   Platelets 150 - 400 K/uL 475  466  446       Latest Ref Rng & Units 12/12/2022    4:58 AM 12/11/2022    4:35 AM 12/10/2022    4:10 AM  CMP  Glucose 70 - 99 mg/dL 94  78  78   BUN 6 - 20 mg/dL '20  21  13   '$ Creatinine 0.61 - 1.24 mg/dL 1.09  0.89  0.84   Sodium 135 - 145 mmol/L 133  134  135   Potassium 3.5 - 5.1 mmol/L 3.7  4.2  4.0   Chloride 98 - 111 mmol/L 99  100  104   CO2 22 - 32 mmol/L '26  27  23   '$ Calcium 8.9 - 10.3 mg/dL 8.7  8.7  8.3        Micro Results Recent Results (from the past 240 hour(s))  Resp panel by RT-PCR (RSV, Flu A&B, Covid) Anterior Nasal Swab     Status: None   Collection Time: 12/06/22 10:56 AM   Specimen: Anterior Nasal Swab  Result Value Ref Range Status   SARS Coronavirus 2 by RT PCR NEGATIVE NEGATIVE Final    Comment: (NOTE) SARS-CoV-2 target nucleic acids are NOT DETECTED.  The SARS-CoV-2 RNA is generally detectable in upper respiratory specimens during the acute phase of infection. The lowest concentration of SARS-CoV-2 viral copies this assay can detect is 138 copies/mL. A negative result does not preclude SARS-Cov-2 infection and should not be used as the sole basis for treatment or other patient management decisions. A negative result may occur with  improper specimen collection/handling, submission of specimen other than nasopharyngeal swab, presence of viral mutation(s) within the areas targeted by this assay, and inadequate number of viral copies(<138 copies/mL). A negative result must be combined with clinical observations, patient history, and epidemiological information. The expected result is Negative.  Fact Sheet for Patients:  EntrepreneurPulse.com.au  Fact Sheet for Healthcare Providers:  IncredibleEmployment.be  This test is no t yet  approved or cleared by the [place] FDA and  has been authorized for detection and/or diagnosis of SARS-CoV-2 by FDA under an Emergency Use Authorization (EUA). This EUA will remain  in effect (meaning this test can be used) for the duration of the COVID-19 declaration under Section 564(b)(1) of the Act, 21 U.S.C.section 360bbb-3(b)(1), unless the authorization is terminated  or revoked sooner.       Influenza A by PCR NEGATIVE NEGATIVE Final   Influenza B by PCR NEGATIVE NEGATIVE Final    Comment: (NOTE) The Xpert Xpress SARS-CoV-2/FLU/RSV plus assay is intended as an aid in the diagnosis of influenza from Nasopharyngeal swab specimens and should not be used as a sole basis for treatment. Nasal washings and aspirates are unacceptable for Xpert Xpress SARS-CoV-2/FLU/RSV testing.  Fact Sheet for Patients: EntrepreneurPulse.com.au  Fact Sheet for Healthcare Providers: IncredibleEmployment.be  This test is not yet approved or cleared by the [place] FDA and has been authorized for detection and/or diagnosis of SARS-CoV-2 by FDA under an Emergency Use Authorization (EUA). This EUA will remain in effect (meaning this test can be used) for the duration of the COVID-19 declaration under Section 564(b)(1) of the Act, 21 U.S.C. section 360bbb-3(b)(1), unless the authorization is terminated or revoked.     Resp Syncytial Virus by PCR NEGATIVE NEGATIVE Final  Comment: (NOTE) Fact Sheet for Patients: EntrepreneurPulse.com.au  Fact Sheet for Healthcare Providers: IncredibleEmployment.be  This test is not yet approved or cleared by the [place] FDA and has been authorized for detection and/or diagnosis of SARS-CoV-2 by FDA under an Emergency Use Authorization (EUA). This EUA will remain in effect (meaning this test can be used) for the duration of the COVID-19 declaration under Section 564(b)(1) of  the Act, 21 U.S.C. section 360bbb-3(b)(1), unless the authorization is terminated or revoked.  Performed at [facility], [name] [place], [place] [address]   Blood Culture (routine x 2)     Status: None   Collection Time: 12/06/22 11:17 AM   Specimen: BLOOD  Result Value Ref Range Status   Specimen Description BLOOD RIGHT ASSIST CONTROL  Final   Special Requests   Final    BOTTLES DRAWN AEROBIC AND ANAEROBIC Blood Culture results may not be optimal due to an excessive volume of blood received in culture bottles   Culture   Final    NO GROWTH 5 DAYS Performed at [facility], [address] [place], [place] [address]    Report Status 12/11/2022 FINAL  Final  Blood Culture (routine x 2)     Status: None (Preliminary result)   Collection Time: 12/06/22 11:17 AM   Specimen: BLOOD  Result Value Ref Range Status   Specimen Description   Final    BLOOD BLOOD RIGHT ARM Performed at [facility], [address], [place], [place] [address]    Special Requests   Final    BOTTLES DRAWN AEROBIC AND ANAEROBIC Blood Culture adequate volume Performed at [facility], [address], [place], [place] [address]    Culture  Setup Time   Final    GRAM POSITIVE RODS Gram Stain Report Called to,Read Back By and Verified With: CATES LAND M ON 12/07/22 AT 2230 BY LOY,C AEROBIC BOTTLE PERFORMED AT [facility]    Culture   Final    GRAM POSITIVE RODS sent to [facility] for identification Performed at [facility], [place] [address], [place], [place] [address]    Report Status PENDING  Incomplete  Urine Culture     Status: Abnormal   Collection Time: 12/06/22  2:00 PM   Specimen: Urine, Random  Result Value Ref Range Status   Specimen Description   Final    URINE, RANDOM Performed at [name], [address], [place], [place] [address]    Special Requests   Final    NONE Reflexed from [id number] Performed at [facility], [address], [place], [place] [address]    Culture MULTIPLE SPECIES  PRESENT, SUGGEST RECOLLECTION (A)  Final   Report Status 12/08/2022 FINAL  Final    Radiology Reports No results found.  SIGNED: Deatra James, MD, FHM. FAAFP. Zacarias Pontes - [facility] Time spent > 55 min.  In seeing, evaluating and examining the patient. Reviewing medical records, labs, drawn plan of care. [facility],  Pager (please use [email or web address] to page/ text) Please use Epic Secure Chat for non-urgent communication (7AM-7PM)  If 7PM-7AM, please contact night-coverage [email or web address], 12/13/2022, 1:00 PM

## 2022-12-13 NOTE — Progress Notes (Signed)
   12/13/22 0728  Assess: MEWS Score  Temp 98.7 F (37.1 C)  BP (!) 84/60  MAP (mmHg) 69  Pulse Rate (!) 110  Resp 18  SpO2 100 %  Assess: MEWS Score  MEWS Temp 0  MEWS Systolic 1  MEWS Pulse 1  MEWS RR 0  MEWS LOC 0  MEWS Score 2  MEWS Score Color Yellow  Assess: if the MEWS score is Yellow or Red  Were vital signs taken at a resting state? Yes  Focused Assessment No change from prior assessment  Does the patient meet 2 or more of the SIRS criteria? No  Does the patient have a confirmed or suspected source of infection? No  Provider and Rapid Response Notified? No  MEWS guidelines implemented  No, previously yellow, continue vital signs every 4 hours  Notify: Charge Nurse/RN  Name of Charge Nurse/RN Audiological scientist  Provider Notification  Provider Name/Title Skipper Cliche  Date Provider Notified 12/13/22  Time Provider Notified [phone]  Method of Notification Page (Secure chat)  Notification Reason Other (Comment) (yellow MEWs)  Provider response See new orders  Date of Provider Response 12/13/22  Time of Provider Response 0756  Assess: SIRS CRITERIA  SIRS Temperature  0  SIRS Pulse 1  SIRS Respirations  0  SIRS WBC 0  SIRS Score Sum  1

## 2022-12-13 NOTE — Progress Notes (Signed)
Rechecked patients blood pressure after patient completed 500 mL bolus, blood pressure 97/73 map 80 and pulse rate 100. MD Shahmehdi made aware.

## 2022-12-13 NOTE — Progress Notes (Signed)
   12/13/22 0000  Assess: MEWS Score  Temp 97.6 F (36.4 C)  BP 94/65  MAP (mmHg) 75  Pulse Rate (!) 104  Resp 18  SpO2 100 %  O2 Device Room Air  Assess: MEWS Score  MEWS Temp 0  MEWS Systolic 1  MEWS Pulse 1  MEWS RR 0  MEWS LOC 0  MEWS Score 2  MEWS Score Color Yellow  Assess: if the MEWS score is Yellow or Red  Were vital signs taken at a resting state? Yes  Focused Assessment No change from prior assessment  Does the patient meet 2 or more of the SIRS criteria? No  Does the patient have a confirmed or suspected source of infection? No  Provider and Rapid Response Notified? No  MEWS guidelines implemented  No, previously yellow, continue vital signs every 4 hours  Notify: Charge Nurse/RN  Name of Charge Nurse/RN Engineer, maintenance, RN  Assess: SIRS CRITERIA  SIRS Temperature  0  SIRS Pulse 1  SIRS Respirations  0  SIRS WBC 0  SIRS Score Sum  1

## 2022-12-13 NOTE — Progress Notes (Signed)
Patient assisted from bed to chair with two assist, verbalized no complaints. Tolerated medication crushed in applesauce. Patients blood pressure this morning at 0728 was 84/60, pulse 110. MD made aware. New orders placed. Patient given sodium chloride 0.9% 500 mL  bolus at 0816. Rechecked after bolus completed blood pressure 97/73, pulse 100. MD aware. Patients blood pressure running low during shift and heart rate elevated. Patient has been yellow MEWs during shift. MD Shahmehdi aware.

## 2022-12-13 NOTE — TOC Progression Note (Signed)
Transition of Care [facility]) - Progression Note    Patient Details  Name: [name] MRN: [id number] Date of Birth: [birth year]  Transition of Care [facility]) CM/SW Contact  Joaquin Courts, RN Phone Number: 12/13/2022, 10:24 AM  Clinical Narrative:    [place] authorization remains pending at this time.   Expected Discharge Plan: [place] Barriers to Discharge: Continued Medical Work up  Expected Discharge Plan and Services                                               Social Determinants of Health (SDOH) Interventions SDOH Screenings   Alcohol Screen: Medium Risk (07/22/2018)  Tobacco Use: High Risk (12/06/2022)    Readmission Risk Interventions    02/15/2022    2:35 PM 08/24/2021    4:00 PM 08/24/2021   11:54 AM  Readmission Risk Prevention Plan  Transportation Screening Complete Complete   PCP or Specialist Appt within 3-5 Days  Complete Complete  HRI or [place]  Complete Complete  Social Work Consult for [place] Planning/Counseling   Complete  Palliative Care Screening  Complete Complete  Medication Review Press photographer) Complete Complete Complete  PCP or Specialist appointment within 3-5 days of discharge Complete    HRI or [place] Complete    SW Recovery Care/Counseling Consult Complete    [place] Not Applicable

## 2022-12-13 NOTE — TOC Progression Note (Signed)
Transition of Care [facility]) - Progression Note    Patient Details  Name: [name] MRN: [id number] Date of Birth: 10-19-1985  Transition of [facility]) CM/SW Contact  Joaquin Courts, RN Phone Number: 12/13/2022, 3:57 PM  Clinical Narrative:    CM checked on auth status again, remains pending at this time.   Expected Discharge Plan: [place] Barriers to Discharge: Continued Medical Work up  Expected Discharge Plan and Services                                               Social Determinants of Health (SDOH) Interventions SDOH Screenings   Alcohol Screen: Medium Risk (07/22/2018)  Tobacco Use: High Risk (12/06/2022)    Readmission Risk Interventions    02/15/2022    2:35 PM 08/24/2021    4:00 PM 08/24/2021   11:54 AM  Readmission Risk Prevention Plan  Transportation Screening Complete Complete   PCP or Specialist Appt within 3-5 Days  Complete Complete  HRI or [place]  Complete Complete  Social Work Consult for [place] Planning/Counseling   Complete  Palliative Care Screening  Complete Complete  Medication Review Press photographer) Complete Complete Complete  PCP or Specialist appointment within 3-5 days of discharge Complete    HRI or [place] Complete    SW Recovery Care/Counseling Consult Complete    [place] Not Applicable

## 2022-12-13 NOTE — Progress Notes (Signed)
   12/13/22 0421  Assess: MEWS Score  Temp 98.4 F (36.9 C)  BP (!) 88/58  MAP (mmHg) 69  Pulse Rate (!) 107  Resp 18  SpO2 100 %  O2 Device Room Air  Assess: MEWS Score  MEWS Temp 0  MEWS Systolic 1  MEWS Pulse 1  MEWS RR 0  MEWS LOC 0  MEWS Score 2  MEWS Score Color Yellow  Assess: if the MEWS score is Yellow or Red  Were vital signs taken at a resting state? Yes  Focused Assessment No change from prior assessment  Does the patient meet 2 or more of the SIRS criteria? No  Does the patient have a confirmed or suspected source of infection? No  Provider and Rapid Response Notified? No  MEWS guidelines implemented  No, previously yellow, continue vital signs every 4 hours  Notify: Charge Nurse/RN  Name of Charge Nurse/RN Engineer, maintenance, RN  Assess: SIRS CRITERIA  SIRS Temperature  0  SIRS Pulse 1  SIRS Respirations  0  SIRS WBC 0  SIRS Score Sum  1

## 2022-12-13 NOTE — Progress Notes (Signed)
   12/13/22 1203  Assess: MEWS Score  Temp 98 F (36.7 C)  BP 93/72  MAP (mmHg) 80  Pulse Rate (!) 111  Resp 18  SpO2 100 %  O2 Device Room Air  Assess: MEWS Score  MEWS Temp 0  MEWS Systolic 1  MEWS Pulse 2  MEWS RR 0  MEWS LOC 0  MEWS Score 3  MEWS Score Color Yellow  Assess: if the MEWS score is Yellow or Red  Were vital signs taken at a resting state? Yes  Focused Assessment No change from prior assessment  Does the patient meet 2 or more of the SIRS criteria? Yes  Does the patient have a confirmed or suspected source of infection? Yes  Provider and Rapid Response Notified? No  MEWS guidelines implemented  No, previously yellow, continue vital signs every 4 hours  Notify: Charge Nurse/RN  Name of Charge Nurse/RN Audiological scientist  Provider Notification  Provider Name/Title Skipper Cliche MD  Date Provider Notified 12/13/22  Time Provider Notified 1205  Method of Notification Page (Secure chat)  Notification Reason Other (Comment) (yellow MEWs)  Provider response No new orders  Date of Provider Response 12/13/22  Assess: SIRS CRITERIA  SIRS Temperature  0  SIRS Pulse 1  SIRS Respirations  0  SIRS WBC 1  SIRS Score Sum  2

## 2022-12-14 ENCOUNTER — Other Ambulatory Visit (HOSPITAL_COMMUNITY): Payer: Self-pay | Admitting: *Deleted

## 2022-12-14 ENCOUNTER — Inpatient Hospital Stay (HOSPITAL_COMMUNITY): Payer: Medicare HMO

## 2022-12-14 ENCOUNTER — Other Ambulatory Visit (HOSPITAL_COMMUNITY): Payer: Self-pay

## 2022-12-14 DIAGNOSIS — R008 Other abnormalities of heart beat: Secondary | ICD-10-CM | POA: Diagnosis not present

## 2022-12-14 LAB — ECHOCARDIOGRAM COMPLETE
AV Mean grad: 2.6 mmHg
AV Peak grad: 4.3 mmHg
Ao pk vel: 1.04 m/s
Area-P 1/2: 5.75 cm2
Height: 75 in
MV M vel: 0.95 m/s
MV Peak grad: 3.6 mmHg
Weight: 2363.33 oz

## 2022-12-14 MED ORDER — SODIUM CHLORIDE 1 G PO TABS
1.0000 g | ORAL_TABLET | Freq: Two times a day (BID) | ORAL | Status: DC
  Administered 2022-12-14 (×2): 1 g via ORAL
  Filled 2022-12-14 (×2): qty 1

## 2022-12-14 MED ORDER — SODIUM CHLORIDE 0.9 % IV BOLUS
2000.0000 mL | Freq: Once | INTRAVENOUS | Status: AC
  Administered 2022-12-14: 2000 mL via INTRAVENOUS

## 2022-12-14 MED ORDER — FLUDROCORTISONE ACETATE 0.1 MG PO TABS
0.0500 mg | ORAL_TABLET | Freq: Every day | ORAL | Status: DC
  Administered 2022-12-14: 0.05 mg via ORAL
  Filled 2022-12-14: qty 1

## 2022-12-14 MED ORDER — SODIUM CHLORIDE 0.9 % IV BOLUS
1000.0000 mL | Freq: Once | INTRAVENOUS | Status: AC
  Administered 2022-12-14: 1000 mL via INTRAVENOUS

## 2022-12-14 NOTE — Progress Notes (Signed)
Occupational Therapy Treatment Patient Details Name: [name] MRN: [id number] DOB: [birth year] Today's Date: 12/14/2022   History of present illness [name]  is a 37 y.o. male with PMHx relevant for  HIV, Bipolar 1 disorder/Schizoaffective disorder/PTSD/Depression/anxiety , HTN, FTT, Chronic hidradenitis suppurativa involving groin creases, pubic region, buttocks , Anemia of chronic disease, alcoholic liver cirrhosis, GERD and history of orthostatic hypotension in the setting of autonomic dysfunction on chronic decubitus ulcers in the setting of hidradenitis suppurativa presents from home by EMS ??  Altered mentation in the setting of poor oral intake--- he apparently has a home health CNA--- who quit about a week ago show patient does not have sufficient care at home.  -Patient states she has not had oral intake for 2 days  --Apparently patient has been less awake and at times more confused  -He was covered in feces when EMS got there he had a low blood pressure EMS gave him NS bolus of 300 mL x 1  -No fevers no emesis, no BM in about 2 days   OT comments  Pt agreeable to PT and OT co-treatment. Nurse requested orthostatics be taken with pt. Pt was initially sitting in the chair at start of session. BP taken at 96/75. Pt stood with mod to max A with PT and OT. Standing BP recorded at 48/35 with reports of dizziness. Pt sat for a couple minutes and BP was taken again with results of 80/62 mmHG. Pt was then transferred to the bed with mod to max A via stand pivot with RW. Mod A needed to achieve supine position in bed. Pt final supine BP taken at 95/72 mmHG. Nursing notified. Pt left in bed with call bell within reach. Pt will benefit from continued OT in the hospital and recommended venue below to increase strength, balance, and endurance for safe ADL's.      Recommendations for follow up therapy are one component of a multi-disciplinary discharge planning process, led by the attending  physician.  Recommendations may be updated based on patient status, additional functional criteria and insurance authorization.    Follow Up Recommendations  Skilled nursing-short term rehab (<3 hours/day)     Assistance Recommended at Discharge Intermittent Supervision/Assistance  Patient can return home with the following  A lot of help with walking and/or transfers;A lot of help with bathing/dressing/bathroom;Assistance with cooking/housework   Equipment Recommendations  None recommended by OT    Recommendations for Other Services      Precautions / Restrictions Precautions Precautions: Fall Restrictions Weight Bearing Restrictions: No       Mobility Bed Mobility Overal bed mobility: Needs Assistance Bed Mobility: Sit to Supine       Sit to supine: Mod assist   General bed mobility comments: labored movement; assist to move B LE into bed.    Transfers Overall transfer level: Needs assistance Equipment used: Rolling walker (2 wheels) Transfers: Sit to/from Stand, Bed to chair/wheelchair/BSC Sit to Stand: Mod assist, Max assist Stand pivot transfers: Mod assist, Max assist         General transfer comment: Able to bear some weight through  B LE. Using RW; x2 reps of sit to stand.     Balance Overall balance assessment: Needs assistance Sitting-balance support: Feet supported, No upper extremity supported Sitting balance-Leahy Scale: Good Sitting balance - Comments: seated at EOB   Standing balance support: Bilateral upper extremity supported, Reliant on assistive device for balance Standing balance-Leahy Scale: Poor Standing balance comment: fair/poor using RW  Cognition Arousal/Alertness: Awake/alert Behavior During Therapy: WFL for tasks assessed/performed Overall Cognitive Status: History of cognitive impairments - at baseline                                                        General Comments Orthostatics taken during session.    Pertinent Vitals/ Pain       Pain Assessment Pain Assessment: Faces Faces Pain Scale: Hurts a little bit Pain Location: low back Pain Descriptors / Indicators: Other (Comment) (none stated, no grimacing observed) Pain Intervention(s): Limited activity within patient's tolerance, Monitored during session, Repositioned                                                          Frequency  Min 2X/week        Progress Toward Goals  OT Goals(current goals can now be found in the care plan section)  Progress towards OT goals: Progressing toward goals  Acute Rehab OT Goals Patient Stated Goal: To go to rehab. OT Goal Formulation: With patient Time For Goal Achievement: 12/23/22 Potential to Achieve Goals: Fair ADL Goals Pt Will Perform Upper Body Bathing: with set-up Pt Will Perform Lower Body Dressing: with min assist Pt Will Transfer to Toilet: with min assist  Plan Discharge plan remains appropriate    Co-evaluation    PT/OT/SLP Co-Evaluation/Treatment: Yes Reason for Co-Treatment: To address functional/ADL transfers   OT goals addressed during session: ADL's and self-care                         End of Session Equipment Utilized During Treatment: Rolling walker (2 wheels);Gait belt  OT Visit Diagnosis: Muscle weakness (generalized) (M62.81);Unsteadiness on feet (R26.81)   Activity Tolerance Patient tolerated treatment well   Patient Left in bed;with call bell/phone within reach   Nurse Communication  Notified of BP measurements.         Time: [id number] OT Time Calculation (min): 23 min  Charges: OT General Charges $OT Visit: 1 Visit OT Treatments $Therapeutic Exercise: 8-22 mins  Linde Wilensky OT, MOT  Larey Seat 12/14/2022, 10:39 AM

## 2022-12-14 NOTE — Progress Notes (Signed)
   12/14/22 0950  Orthostatic Lying   BP- Lying 95/72  Pulse- Lying 129  Orthostatic Sitting  BP- Sitting 96/75  Orthostatic Standing at 0 minutes  BP- Standing at 0 minutes (!) 48/35

## 2022-12-14 NOTE — Progress Notes (Signed)
Pt receiving iv placement.

## 2022-12-14 NOTE — Progress Notes (Addendum)
PROGRESS NOTE    [name]  [id number] DOB: 1986-09-28 DOA: 12/06/2022 PCP: [name], MD   Brief Narrative:    [name] is  a 37 year old male apparently bedbound from home with significant history of HIV,  , HTN, FTT, Chronic hidradenitis suppurativa involving groin creases, pubic region, buttocks , Anemia of chronic disease, alcoholic liver cirrhosis, GERD and history of orthostatic hypotension in the setting of autonomic dysfunction on chronic decubitus ulcers in the setting of hidradenitis suppurativa presents from home by EMS ??  Altered mentation in the setting of poor oral intake .   His mentation has improved and he now continues to have persistent orthostasis requiring repeated IV fluid boluses as well as titration of his medications.  Assessment & Plan:   Principal Problem:   Acute metabolic encephalopathy Active Problems:   Anemia   UTI (urinary tract infection)   Protein-calorie malnutrition, severe (HCC)   GERD (gastroesophageal reflux disease)   HIV disease (HCC)   Avascular necrosis of femoral head, left (HCC)   FTT (failure to thrive) in adult   Alcoholic cirrhosis of liver (HCC)   Bipolar disorder (HCC)  Assessment and Plan:   Acute metabolic encephalopathy-resolved --resolved, mentation at baseline   -Altered mental status likely related to: UTI, proctitis, dehydration -Multiple hospitalization, -COVID,  flu and RSV negative -Hydrate and treat infection     Acute on chronic anemia - Anemia of chronic disease -No active signs of bleeding -Hemoglobin 6.5 on 12/09/2022 -Likely multifactorial-exacerbated by proctitis, chronic groin wounds, hemodilution -Transfused 2U PRBC 12/10/2022  -Obtaining iron studies, Hemoccult, B12, folate -Discontinue heparin for DVT prophylaxis   proctitis--- patient received cefepime and Vanco in the ED --CT abdomen pelvis with possible proctitis - WBC is up to 14.4 >>>9.7 >>12.1  -  Lactic acid is  2.5 Treat empirically with Rocephin and Flagyl >> 12/12/2022 switching to p.o. Augmentin for 3 more days concluding 10 days of antibiotics   UTI-- +ve Dysuria, --UA suggestive of UTI -WBC is up to 14.4, 12.1  - Lactic acid is 2.5 >> 1.8  - IV Rocephin >> switch to p.o. Augmentin 12/12/2022 for 3 more days concluding 10 days of antibiotics    HIV, on HAART Cont home meds.  Biktarvy -Patient sees infectious disease at [facility]    Failure to thrive in adult -Bedbound, -Multiple attempts to ambulate patient unable to hold steady gait or take steps -Give Ensure nutritional supplements Liberalize diet.  On folic acid, multivitamin, thiamine   Bipolar 1 disorder/Schizoaffective disorder/PTSD/Depression/anxiety-- Resume home regimen-including Restoril, trazodone, Lexapro and Latuda -Xanax as ordered -History of noncompliance with medications   Orthostatic hypotension/autonomic dysfunction -Patient is bedbound -- Increased Midodrine to 15 mg p.o. 3 times daily AC   Hypotensive  -Recent midodrine, IV fluid as needed Monitoring closely -Requiring repeat IV fluid boluses due to persistent orthostasis -Started on sodium chloride tablets and Florinef 3/6 as well as TED hoses -Plan to check 2D echocardiogram and a.m. cortisol   Tachyarrhythmia -Currently normal sinus, tachycardia -Discontinue Coreg and continue fluid boluses as above     GERD -Pepcid as ordered   Chronic hidradenitis suppurativa involving groin creases, pubic region, buttocks --see photos in epic -Decubitus ulcers-noted they do not look infected at this time -Some of the wounds are deep and tracking   DVT prophylaxis:Lovenox Code Status: Full Family Communication: None at bedside Disposition Plan:  Status is: Inpatient Remains inpatient appropriate because: Need for IV fluids.   Consultants:  None  Procedures:  None  Antimicrobials:  Anti-infectives (From admission, onward)    Start     Dose/Rate Route  Frequency Ordered Stop   12/13/22 2200  amoxicillin-clavulanate (AUGMENTIN) 875-125 MG per tablet 1 tablet        1 tablet Oral Every 12 hours 12/13/22 0821 12/15/22 0959   12/12/22 1145  amoxicillin-clavulanate (AUGMENTIN) 875-125 MG per tablet 1 tablet        1 tablet Oral Every 12 hours 12/12/22 1058 12/13/22 0819   12/08/22 2200  metroNIDAZOLE (FLAGYL) tablet 500 mg  Status:  Discontinued        500 mg Oral Every 8 hours 12/08/22 1212 12/12/22 1057   12/07/22 0200  vancomycin (VANCOREADY) IVPB 750 mg/150 mL        750 mg 150 mL/hr over 60 Minutes Intravenous Every 12 hours 12/06/22 1409 12/07/22 0215   12/06/22 2200  cefTRIAXone (ROCEPHIN) 1 g in sodium chloride 0.9 % 100 mL IVPB  Status:  Discontinued        1 g 200 mL/hr over 30 Minutes Intravenous Every 24 hours 12/06/22 1844 12/12/22 1058   12/06/22 2200  metroNIDAZOLE (FLAGYL) IVPB 500 mg  Status:  Discontinued        500 mg 100 mL/hr over 60 Minutes Intravenous Every 12 hours 12/06/22 1845 12/08/22 1212   12/06/22 2100  ceFEPIme (MAXIPIME) 2 g in sodium chloride 0.9 % 100 mL IVPB  Status:  Discontinued        2 g 200 mL/hr over 30 Minutes Intravenous Every 8 hours 12/06/22 1314 12/06/22 1844   12/06/22 1800  bictegravir-emtricitabine-tenofovir AF (BIKTARVY) 50-200-25 MG per tablet 1 tablet        1 tablet Oral Daily 12/06/22 1633     12/06/22 1800  valACYclovir (VALTREX) tablet 1,000 mg        1,000 mg Oral Daily 12/06/22 1633     12/06/22 1300  vancomycin (VANCOREADY) IVPB 1500 mg/300 mL        1,500 mg 150 mL/hr over 120 Minutes Intravenous  Once 12/06/22 1245 12/06/22 1555   12/06/22 1230  ceFEPIme (MAXIPIME) 2 g in sodium chloride 0.9 % 100 mL IVPB        2 g 200 mL/hr over 30 Minutes Intravenous  Once 12/06/22 1229 12/06/22 1315   12/06/22 1230  metroNIDAZOLE (FLAGYL) IVPB 500 mg        500 mg 100 mL/hr over 60 Minutes Intravenous  Once 12/06/22 1229 12/06/22 1505   12/06/22 1230  vancomycin (VANCOCIN) IVPB 1000 mg/200  mL premix  Status:  Discontinued        1,000 mg 200 mL/hr over 60 Minutes Intravenous  Once 12/06/22 1229 12/06/22 1245       Subjective: Patient seen and evaluated today with no new acute complaints or concerns. No acute concerns or events noted overnight.  He continues to have significant orthostasis with standing.  Objective: Vitals:   12/14/22 0000 12/14/22 0416 12/14/22 0744 12/14/22 1326  BP: 105/75 104/77 102/64 110/78  Pulse: (!) 126 (!) 129 (!) 131 (!) 117  Resp: 18 18    Temp: 98.6 F (37 C) 98.7 F (37.1 C) 98.4 F (36.9 C) 99 F (37.2 C)  TempSrc: Oral Oral  Oral  SpO2: 99% 99% 100% 100%  Weight:      Height:        Intake/Output Summary (Last 24 hours) at 12/14/2022 1334 Last data filed at 12/14/2022 1000 Gross per 24 hour  Intake 480 ml  Output  1660 ml  Net -1180 ml   Filed Weights   12/06/22 1010  Weight: 67 kg    Examination:  General exam: Appears calm and comfortable  Respiratory system: Clear to auscultation. Respiratory effort normal. Cardiovascular system: S1 & S2 heard, RRR.  Gastrointestinal system: Abdomen is soft Central nervous system: Alert and awake Extremities: No edema Skin: No significant lesions noted Psychiatry: Flat affect.    Data Reviewed: I have personally reviewed following labs and imaging studies  CBC: Recent Labs  Lab 12/09/22 0443 12/10/22 0410 12/11/22 0435 12/12/22 0458 12/13/22 0839  WBC 9.7 12.1* 13.7* 13.3* 12.8*  HGB 6.5* 9.1* 9.6* 9.4* 9.0*  HCT 21.9* 29.6* 31.2* 31.5* 29.5*  MCV 93.6 91.9 92.3 92.9 92.8  PLT 425* 447* 446* 466* [id number]*   Basic Metabolic Panel: Recent Labs  Lab 12/08/22 0451 12/09/22 0443 12/10/22 0410 12/11/22 0435 12/11/22 0733 12/12/22 0458  NA 135 136 135 134*  --  133*  K 2.8* 3.3* 4.0 4.2  --  3.7  CL 105 103 104 100  --  99  CO2 '24 28 23 27  '$ --  26  GLUCOSE 115* 85 78 78  --  94  BUN '8 12 13 '$ 21*  --  20  CREATININE 0.88 0.79 0.84 0.89  --  1.09  CALCIUM 7.7* 8.0* 8.3*  8.7*  --  8.7*  MG  --   --   --   --  2.0  --    GFR: Estimated Creatinine Clearance: 86.2 mL/min (by C-G formula based on SCr of 1.09 mg/dL). Liver Function Tests: No results for input(s): "AST", "ALT", "ALKPHOS", "BILITOT", "PROT", "ALBUMIN" in the last 168 hours. No results for input(s): "LIPASE", "AMYLASE" in the last 168 hours. No results for input(s): "AMMONIA" in the last 168 hours. Coagulation Profile: No results for input(s): "INR", "PROTIME" in the last 168 hours. Cardiac Enzymes: No results for input(s): "CKTOTAL", "CKMB", "CKMBINDEX", "TROPONINI" in the last 168 hours. BNP (last 3 results) No results for input(s): "PROBNP" in the last 8760 hours. HbA1C: No results for input(s): "HGBA1C" in the last 72 hours. CBG: No results for input(s): "GLUCAP" in the last 168 hours. Lipid Profile: No results for input(s): "CHOL", "HDL", "LDLCALC", "TRIG", "CHOLHDL", "LDLDIRECT" in the last 72 hours. Thyroid Function Tests: No results for input(s): "TSH", "T4TOTAL", "FREET4", "T3FREE", "THYROIDAB" in the last 72 hours. Anemia Panel: No results for input(s): "VITAMINB12", "FOLATE", "FERRITIN", "TIBC", "IRON", "RETICCTPCT" in the last 72 hours. Sepsis Labs: Recent Labs  Lab 12/11/22 0733  PROCALCITON 0.12  LATICACIDVEN 1.8    Recent Results (from the past 240 hour(s))  Resp panel by RT-PCR (RSV, Flu A&B, Covid) Anterior Nasal Swab     Status: None   Collection Time: 12/06/22 10:56 AM   Specimen: Anterior Nasal Swab  Result Value Ref Range Status   SARS Coronavirus 2 by RT PCR NEGATIVE NEGATIVE Final    Comment: (NOTE) SARS-CoV-2 target nucleic acids are NOT DETECTED.  The SARS-CoV-2 RNA is generally detectable in upper respiratory specimens during the acute phase of infection. The lowest concentration of SARS-CoV-2 viral copies this assay can detect is 138 copies/mL. A negative result does not preclude SARS-Cov-2 infection and should not be used as the sole basis for  treatment or other patient management decisions. A negative result may occur with  improper specimen collection/handling, submission of specimen other than nasopharyngeal swab, presence of viral mutation(s) within the areas targeted by this assay, and inadequate number of viral copies(<138 copies/mL). A  negative result must be combined with clinical observations, patient history, and epidemiological information. The expected result is Negative.  Fact Sheet for Patients:  EntrepreneurPulse.com.au  Fact Sheet for Healthcare Providers:  IncredibleEmployment.be  This test is no t yet approved or cleared by the [place] FDA and  has been authorized for detection and/or diagnosis of SARS-CoV-2 by FDA under an Emergency Use Authorization (EUA). This EUA will remain  in effect (meaning this test can be used) for the duration of the COVID-19 declaration under Section 564(b)(1) of the Act, 21 U.S.C.section 360bbb-3(b)(1), unless the authorization is terminated  or revoked sooner.       Influenza A by PCR NEGATIVE NEGATIVE Final   Influenza B by PCR NEGATIVE NEGATIVE Final    Comment: (NOTE) The Xpert Xpress SARS-CoV-2/FLU/RSV plus assay is intended as an aid in the diagnosis of influenza from Nasopharyngeal swab specimens and should not be used as a sole basis for treatment. Nasal washings and aspirates are unacceptable for Xpert Xpress SARS-CoV-2/FLU/RSV testing.  Fact Sheet for Patients: EntrepreneurPulse.com.au  Fact Sheet for Healthcare Providers: IncredibleEmployment.be  This test is not yet approved or cleared by the [place] FDA and has been authorized for detection and/or diagnosis of SARS-CoV-2 by FDA under an Emergency Use Authorization (EUA). This EUA will remain in effect (meaning this test can be used) for the duration of the COVID-19 declaration under Section 564(b)(1) of the Act, 21  U.S.C. section 360bbb-3(b)(1), unless the authorization is terminated or revoked.     Resp Syncytial Virus by PCR NEGATIVE NEGATIVE Final    Comment: (NOTE) Fact Sheet for Patients: EntrepreneurPulse.com.au  Fact Sheet for Healthcare Providers: IncredibleEmployment.be  This test is not yet approved or cleared by the [place] FDA and has been authorized for detection and/or diagnosis of SARS-CoV-2 by FDA under an Emergency Use Authorization (EUA). This EUA will remain in effect (meaning this test can be used) for the duration of the COVID-19 declaration under Section 564(b)(1) of the Act, 21 U.S.C. section 360bbb-3(b)(1), unless the authorization is terminated or revoked.  Performed at [facility], [address], [place], [place] [address]   Blood Culture (routine x 2)     Status: None   Collection Time: 12/06/22 11:17 AM   Specimen: BLOOD  Result Value Ref Range Status   Specimen Description BLOOD RIGHT ASSIST CONTROL  Final   Special Requests   Final    BOTTLES DRAWN AEROBIC AND ANAEROBIC Blood Culture results may not be optimal due to an excessive volume of blood received in culture bottles   Culture   Final    NO GROWTH 5 DAYS Performed at [facility], [address], [place], [place] [address]    Report Status 12/11/2022 FINAL  Final  Blood Culture (routine x 2)     Status: None (Preliminary result)   Collection Time: 12/06/22 11:17 AM   Specimen: BLOOD  Result Value Ref Range Status   Specimen Description   Final    BLOOD BLOOD RIGHT ARM Performed at [facility], [address], [place], [place] [address]    Special Requests   Final    BOTTLES DRAWN AEROBIC AND ANAEROBIC Blood Culture adequate volume Performed at [facility], [address] [place], [place] [address]    Culture  Setup Time   Final    GRAM POSITIVE RODS Gram Stain Report Called to,Read Back By and Verified With: CATES LAND M ON 12/07/22 AT 2230 BY  LOY,C AEROBIC BOTTLE PERFORMED AT [facility]  Culture   Final    GRAM POSITIVE RODS sent to [facility] for identification Performed at [facility], [place] [address], [place], [place] [address]    Report Status PENDING  Incomplete  Urine Culture     Status: Abnormal   Collection Time: 12/06/22  2:00 PM   Specimen: Urine, Random  Result Value Ref Range Status   Specimen Description   Final    URINE, RANDOM Performed at [facility], [address], [place], [place] [address]    Special Requests   Final    NONE Reflexed from [id number] Performed at [facility], [address], [place], [place] [address]    Culture MULTIPLE SPECIES PRESENT, SUGGEST RECOLLECTION (A)  Final   Report Status 12/08/2022 FINAL  Final         Radiology Studies: No results found.      Scheduled Meds:  (feeding supplement) PROSource Plus  30 mL Oral BID BM   amoxicillin-clavulanate  1 tablet Oral Q12H   ascorbic acid  500 mg Oral Daily   bictegravir-emtricitabine-tenofovir AF  1 tablet Oral Daily   enoxaparin (LOVENOX) injection  40 mg Subcutaneous Q24H   escitalopram  20 mg Oral Daily   feeding supplement  237 mL Oral BID BM   ferrous sulfate  325 mg Oral Q breakfast   fludrocortisone  0.05 mg Oral Daily   gabapentin  100 mg Oral BID   lactobacillus  1 g Oral TID   lurasidone  120 mg Oral Daily   midodrine  15 mg Oral TID WC   multivitamin with minerals  1 tablet Oral Daily   nicotine  7 mg Transdermal Daily   nutrition supplement (JUVEN)  1 packet Oral BID BM   senna-docusate  2 tablet Oral QHS   sodium chloride flush  3 mL Intravenous Q12H   sodium chloride  1 g Oral BID WC   temazepam  30 mg Oral QHS   thiamine  100 mg Oral Daily   trazodone  100 mg Oral QHS   valACYclovir  1,000 mg Oral Daily    LOS: 8 days    Time spent: 35 minutes    Monte Bronder Darleen Crocker, DO [facility]  If 7PM-7AM, please contact night-coverage [email or web address] 12/14/2022, 1:34 PM

## 2022-12-14 NOTE — Progress Notes (Signed)
Physical Therapy Treatment Patient Details Name: [name] MRN: [id number] DOB: 10-16-1985 Today's Date: 12/14/2022   History of Present Illness [name]  is a 37 y.o. male with PMHx relevant for  HIV, Bipolar 1 disorder/Schizoaffective disorder/PTSD/Depression/anxiety , HTN, FTT, Chronic hidradenitis suppurativa involving groin creases, pubic region, buttocks , Anemia of chronic disease, alcoholic liver cirrhosis, GERD and history of orthostatic hypotension in the setting of autonomic dysfunction on chronic decubitus ulcers in the setting of hidradenitis suppurativa presents from home by EMS ??  Altered mentation in the setting of poor oral intake--- he apparently has a home health CNA--- who quit about a week ago show patient does not have sufficient care at home.  -Patient states she has not had oral intake for 2 days  --Apparently patient has been less awake and at times more confused  -He was covered in feces when EMS got there he had a low blood pressure EMS gave him NS bolus of 300 mL x 1  -No fevers no emesis, no BM in about 2 days    PT Comments    Patient presents seated in chair (assisted by nursing staff) and agreeable for therapy.  Patient required active assistance for completing most exercises with LLE due to weakness, demonstrated good tolerance for standing using RW and able to take a few side steps requiring tactile assistance for moving LLE due to weakness.  Patient's orthostatics as follows: seated in chair 06/75, standing 48/35, and supine in bed 95/72 - nurse notified.  Patient put back to bed after therapy.  Patient will benefit from continued skilled physical therapy in hospital and recommended venue below to increase strength, balance, endurance for safe ADLs and gait.     Recommendations for follow up therapy are one component of a multi-disciplinary discharge planning process, led by the attending physician.  Recommendations may be updated based on patient status,  additional functional criteria and insurance authorization.  Follow Up Recommendations  Skilled nursing-short term rehab (<3 hours/day) Can patient physically be transported by private vehicle: No   Assistance Recommended at Discharge Intermittent Supervision/Assistance  Patient can return home with the following A lot of help with bathing/dressing/bathroom;A lot of help with walking and/or transfers;Help with stairs or ramp for entrance;Assistance with cooking/housework   Equipment Recommendations  None recommended by PT    Recommendations for Other Services       Precautions / Restrictions Precautions Precautions: Fall Restrictions Weight Bearing Restrictions: No     Mobility  Bed Mobility Overal bed mobility: Needs Assistance Bed Mobility: Sit to Supine     Supine to sit: Mod assist, HOB elevated Sit to supine: Mod assist   General bed mobility comments: Unable to lift legs onto bed during sit to supine due to weakness    Transfers Overall transfer level: Needs assistance Equipment used: Rolling walker (2 wheels) Transfers: Sit to/from Stand, Bed to chair/wheelchair/BSC Sit to Stand: Mod assist, Max assist   Step pivot transfers: Mod assist, Max assist       General transfer comment: had most difficulty moving LLE due to weakness requiring tactile assistance    Ambulation/Gait Ambulation/Gait assistance: Max assist Gait Distance (Feet): 3 Feet Assistive device: Rolling walker (2 wheels) Gait Pattern/deviations: Decreased step length - right, Decreased step length - left, Decreased stride length Gait velocity: slow     General Gait Details: limited to a few unsteady labored side steps requiring tactile assistance to move LLE due to weakness   Stairs  Wheelchair Mobility    Modified Rankin (Stroke Patients Only)       Balance Overall balance assessment: Needs assistance Sitting-balance support: Feet supported, No upper extremity  supported Sitting balance-Leahy Scale: Good Sitting balance - Comments: seated at EOB   Standing balance support: During functional activity, Reliant on assistive device for balance, Bilateral upper extremity supported Standing balance-Leahy Scale: Poor Standing balance comment: fair/poor using RW                            Cognition Arousal/Alertness: Awake/alert Behavior During Therapy: WFL for tasks assessed/performed Overall Cognitive Status: History of cognitive impairments - at baseline                                          Exercises General Exercises - Lower Extremity Long Arc Quad: Seated, AROM, AAROM, Strengthening, Both, 10 reps Hip Flexion/Marching: Seated, AROM, AAROM, Strengthening, 10 reps, Both Toe Raises: Seated, AROM, Strengthening, Right, 10 reps Heel Raises: Seated, AROM, Strengthening, Both, 10 reps    General Comments General comments (skin integrity, edema, etc.): Orthostatics taken during session.      Pertinent Vitals/Pain Pain Assessment Pain Assessment: Faces Faces Pain Scale: Hurts a little bit Pain Location: low back Pain Descriptors / Indicators: Sore Pain Intervention(s): Limited activity within patient's tolerance, Monitored during session, Repositioned    Home Living                          Prior Function            PT Goals (current goals can now be found in the care plan section) Acute Rehab PT Goals Patient Stated Goal: return home after rehab PT Goal Formulation: With patient Time For Goal Achievement: 12/21/22 Potential to Achieve Goals: Good Progress towards PT goals: Progressing toward goals    Frequency    Min 3X/week      PT Plan Current plan remains appropriate    Co-evaluation PT/OT/SLP Co-Evaluation/Treatment: Yes Reason for Co-Treatment: To address functional/ADL transfers PT goals addressed during session: Mobility/safety with mobility;Balance;Proper use of DME OT  goals addressed during session: ADL's and self-care      AM-PAC PT "6 Clicks" Mobility   Outcome Measure  Help needed turning from your back to your side while in a flat bed without using bedrails?: A Lot Help needed moving from lying on your back to sitting on the side of a flat bed without using bedrails?: A Lot Help needed moving to and from a bed to a chair (including a wheelchair)?: A Lot Help needed standing up from a chair using your arms (e.g., wheelchair or bedside chair)?: A Lot Help needed to walk in hospital room?: A Lot Help needed climbing 3-5 steps with a railing? : Total 6 Click Score: 11    End of Session Equipment Utilized During Treatment: Gait belt Activity Tolerance: Patient tolerated treatment well;Patient limited by fatigue Patient left: in bed;with call bell/phone within reach Nurse Communication: Mobility status PT Visit Diagnosis: Unsteadiness on feet (R26.81);Other abnormalities of gait and mobility (R26.89);Muscle weakness (generalized) (M62.81)     Time: [id number] PT Time Calculation (min) (ACUTE ONLY): 34 min  Charges:  $Therapeutic Exercise: 8-22 mins $Therapeutic Activity: 8-22 mins  12:26 PM, 12/14/22 Lonell Grandchild, MPT Physical Therapist with [facility] [phone] office [phone] mobile phone

## 2022-12-14 NOTE — Progress Notes (Signed)
  Echocardiogram 2D Echocardiogram has been performed.  Wynelle Link 12/14/2022, 3:52 PM

## 2022-12-14 NOTE — Progress Notes (Signed)
   12/14/22 1755  Orthostatic Lying   BP- Lying 115/85  Pulse- Lying 108  Orthostatic Sitting  BP- Sitting 110/82  Pulse- Sitting 122   Pt refused standing VS

## 2022-12-15 LAB — BASIC METABOLIC PANEL
Anion gap: 6 (ref 5–15)
BUN: 18 mg/dL (ref 6–20)
CO2: 23 mmol/L (ref 22–32)
Calcium: 8.3 mg/dL — ABNORMAL LOW (ref 8.9–10.3)
Chloride: 105 mmol/L (ref 98–111)
Creatinine, Ser: 0.82 mg/dL (ref 0.61–1.24)
GFR, Estimated: >60 mL/min (ref >60)
Glucose, Bld: 82 mg/dL (ref 70–99)
Potassium: 3.6 mmol/L (ref 3.5–5.1)
Sodium: 134 mmol/L — ABNORMAL LOW (ref 135–145)

## 2022-12-15 LAB — CBC
HCT: 27.6 % — ABNORMAL LOW (ref 39.0–52.0)
Hemoglobin: 8.3 g/dL — ABNORMAL LOW (ref 13.0–17.0)
MCH: 28.5 pg (ref 26.0–34.0)
MCHC: 30.1 g/dL (ref 30.0–36.0)
MCV: 94.8 fL (ref 80.0–100.0)
Platelets: 437 10*3/uL — ABNORMAL HIGH (ref 150–400)
RBC: 2.91 MIL/uL — ABNORMAL LOW (ref 4.22–5.81)
RDW: 15.8 % — ABNORMAL HIGH (ref 11.5–15.5)
WBC: 14.1 10*3/uL — ABNORMAL HIGH (ref 4.0–10.5)
nRBC: 0 % (ref 0.0–0.2)

## 2022-12-15 LAB — MAGNESIUM: Magnesium: 1.8 mg/dL (ref 1.7–2.4)

## 2022-12-15 LAB — CORTISOL-AM, BLOOD: Cortisol - AM: 9.9 ug/dL (ref 6.7–22.6)

## 2022-12-15 MED ORDER — ALPRAZOLAM 0.5 MG PO TABS: 0.5000 mg | ORAL_TABLET | Freq: Three times a day (TID) | ORAL | 0 refills | Status: AC | PRN

## 2022-12-15 MED ORDER — TEMAZEPAM 30 MG PO CAPS: 30.0000 mg | ORAL_CAPSULE | Freq: Every day | ORAL | 0 refills | Status: AC

## 2022-12-15 MED ORDER — FERROUS SULFATE 325 (65 FE) MG PO TABS: 325.0000 mg | ORAL_TABLET | Freq: Every day | ORAL | 0 refills | Status: AC

## 2022-12-15 MED ORDER — MIDODRINE HCL 5 MG PO TABS: 15.0000 mg | ORAL_TABLET | Freq: Three times a day (TID) | ORAL | 0 refills | Status: AC

## 2022-12-15 MED ORDER — CYCLOBENZAPRINE HCL 5 MG PO TABS: 5.0000 mg | ORAL_TABLET | Freq: Three times a day (TID) | ORAL | 0 refills | Status: AC | PRN

## 2022-12-15 MED ORDER — HYDROCODONE-ACETAMINOPHEN 5-325 MG PO TABS: 1.0000 | ORAL_TABLET | Freq: Four times a day (QID) | ORAL | 0 refills | Status: AC | PRN

## 2022-12-15 MED ORDER — TRAZODONE HCL 100 MG PO TABS: 100.0000 mg | ORAL_TABLET | Freq: Every day | ORAL | 0 refills | Status: AC

## 2022-12-15 MED ORDER — POLYETHYLENE GLYCOL 3350 17 G PO PACK: 17.0000 g | PACK | Freq: Every day | ORAL | 0 refills | Status: AC | PRN

## 2022-12-15 MED ORDER — GABAPENTIN 100 MG PO CAPS: 100.0000 mg | ORAL_CAPSULE | Freq: Two times a day (BID) | ORAL | 0 refills | Status: AC

## 2022-12-15 NOTE — Progress Notes (Signed)
Attempted to call report to [facility] for d/c. Will continue to call back.

## 2022-12-15 NOTE — Plan of Care (Signed)

## 2022-12-15 NOTE — Progress Notes (Addendum)
Nsg Discharge Note  Admit Date:  12/06/2022 Discharge date: 12/15/2022   NATHANE SABALLOS to be D/C'd [facility] per MD order.  AVS completed.   Patient/caregiver able to verbalize understanding.  Discharge Medication: Allergies as of 12/15/2022       Reactions   Aczone [dapsone] Other (See Comments)   Per [employer], "G6PD deficient"   Pollen Extract Itching, Other (See Comments)   Sneezing   Primaquine Phosphate Other (See Comments)   Per [employer], "G6PD deficient"        Medication List     STOP taking these medications    haloperidol 5 MG tablet Commonly known as: HALDOL       TAKE these medications    acetaminophen 500 MG tablet Commonly known as: TYLENOL Take 1,000 mg by mouth every 6 (six) hours as needed for moderate pain or mild pain.   albuterol 108 (90 Base) MCG/ACT inhaler Commonly known as: VENTOLIN HFA Inhale 1 puff into the lungs every 6 (six) hours as needed for wheezing or shortness of breath.   ALPRAZolam 0.5 MG tablet Commonly known as: XANAX Take 1 tablet (0.5 mg total) by mouth 3 (three) times daily as needed for anxiety.   Biktarvy 50-200-25 MG Tabs tablet Generic drug: bictegravir-emtricitabine-tenofovir AF Take 1 tablet by mouth daily.   cyclobenzaprine 5 MG tablet Commonly known as: FLEXERIL Take 1 tablet (5 mg total) by mouth 3 (three) times daily as needed for muscle spasms. What changed:  medication strength how much to take reasons to take this   diphenoxylate-atropine 2.5-0.025 MG tablet Commonly known as: LOMOTIL Take 1 tablet by mouth daily.   escitalopram 20 MG tablet Commonly known as: LEXAPRO Take 1 tablet (20 mg total) by mouth daily.   famotidine 40 MG tablet Commonly known as: PEPCID Take 40 mg by mouth every morning.   feeding supplement Liqd Take 237 mLs by mouth 2 (two) times daily between meals.   ferrous sulfate 325 (65 FE) MG tablet Take 1 tablet (325 mg total) by mouth daily with breakfast. Start  taking on: March 8, 123456   folic acid 1 MG tablet Commonly known as: FOLVITE Take 1 tablet (1 mg total) by mouth daily.   furosemide 40 MG tablet Commonly known as: LASIX Take 40 mg by mouth daily as needed.   gabapentin 100 MG capsule Commonly known as: NEURONTIN Take 1 capsule (100 mg total) by mouth 2 (two) times daily.   HYDROcodone-acetaminophen 5-325 MG tablet Commonly known as: NORCO/VICODIN Take 1 tablet by mouth every 6 (six) hours as needed for moderate pain. What changed:  when to take this reasons to take this   ibuprofen 200 MG tablet Commonly known as: ADVIL Take 600 mg by mouth every 6 (six) hours as needed for mild pain or moderate pain.   lip balm ointment Apply topically as needed for lip care.   Lurasidone HCl 120 MG Tabs Take 1 tablet (120 mg total) by mouth daily.   midodrine 5 MG tablet Commonly known as: PROAMATINE Take 3 tablets (15 mg total) by mouth 3 (three) times daily with meals.   multivitamin with minerals Tabs tablet Take 1 tablet by mouth daily.   nystatin powder Commonly known as: MYCOSTATIN/NYSTOP Apply topically 2 (two) times daily.   ondansetron 8 MG tablet Commonly known as: ZOFRAN Take 8 mg by mouth every 8 (eight) hours as needed.   pantoprazole 40 MG tablet Commonly known as: PROTONIX Take 1 tablet (40 mg total) by mouth daily.  polyethylene glycol 17 g packet Commonly known as: MIRALAX / GLYCOLAX Take 17 g by mouth daily as needed for mild constipation.   Senexon-S 8.6-50 MG tablet Generic drug: senna-docusate Take 1 tablet by mouth at bedtime as needed for moderate constipation.   SYSTANE OP Place 1-2 drops into both eyes daily as needed (Dry eyes).   temazepam 30 MG capsule Commonly known as: RESTORIL Take 1 capsule (30 mg total) by mouth at bedtime.   thiamine 100 MG tablet Commonly known as: VITAMIN B1 Take 1 tablet (100 mg total) by mouth daily.   traZODone 100 MG tablet Commonly known as:  DESYREL Take 1 tablet (100 mg total) by mouth at bedtime. What changed:  medication strength how much to take   trolamine salicylate 10 % cream Commonly known as: ASPERCREME Apply 1 Application topically as needed for muscle pain.   valACYclovir 1000 MG tablet Commonly known as: VALTREX Take 1 tablet (1,000 mg total) by mouth daily.               Discharge Care Instructions  (From admission, onward)           Start     Ordered   12/15/22 0000  Discharge wound care:       Comments: Cleanse wounds to perineal and buttocks area, bilateral hips, left knee with soap and water and pat dry. Apply aquacel Ag to open areas (LAWSON # [id number]) and cover with gauze and ABD pad or kerlix and tape.  Change Monday/WEd/Friday and PRN soilage.  Offload pressure to bilateral heels with prevalon boots  Needs mattress with low air loss feature.   12/15/22 1037            Discharge Assessment: Vitals:   12/15/22 0154 12/15/22 0556  BP: 111/73 105/68  Pulse: (!) 126 (!) 105  Resp: 19 19  Temp: 98.1 F (36.7 C) 98.2 F (36.8 C)  SpO2: 100% 100%   Skin clean, dry and intact without evidence of skin break down, no evidence of skin tears noted. IV catheter discontinued intact. Site without signs and symptoms of complications - no redness or edema noted at insertion site, patient denies c/o pain - only slight tenderness at site.  Dressing with slight pressure applied.  D/c Instructions-Education: Discharge instructions given to patient/family with verbalized understanding. D/c education completed with patient/family including follow up instructions, medication list, d/c activities limitations if indicated, with other d/c instructions as indicated by MD - patient able to verbalize understanding, all questions fully answered. Patient instructed to return to ED, call 911, or call MD for any changes in condition.  Patient escorted via [place], and D/C home via private auto.  Kathie Rhodes,  RN 12/15/2022 10:59 AM

## 2022-12-15 NOTE — Care Management Important Message (Signed)
Important Message  Patient Details  Name: [name] MRN: [id number] Date of Birth: 1986-03-10   Medicare Important Message Given:  Yes (spoke with mother [name] at [phone] to review letter, no additional copy needed.)     Tommy Medal 12/15/2022, 12:42 PM

## 2022-12-15 NOTE — Progress Notes (Signed)
Occupational Therapy Treatment Patient Details Name: CAVALLI PACIS MRN: [id number] DOB: 1986/07/25 Today's Date: 12/15/2022   History of present illness Crew Mazor  is a 37 y.o. male with PMHx relevant for  HIV, Bipolar 1 disorder/Schizoaffective disorder/PTSD/Depression/anxiety , HTN, FTT, Chronic hidradenitis suppurativa involving groin creases, pubic region, buttocks , Anemia of chronic disease, alcoholic liver cirrhosis, GERD and history of orthostatic hypotension in the setting of autonomic dysfunction on chronic decubitus ulcers in the setting of hidradenitis suppurativa presents from home by EMS ??  Altered mentation in the setting of poor oral intake--- he apparently has a home health CNA--- who quit about a week ago show patient does not have sufficient care at home.  -Patient states she has not had oral intake for 2 days  --Apparently patient has been less awake and at times more confused  -He was covered in feces when EMS got there he had a low blood pressure EMS gave him NS bolus of 300 mL x 1  -No fevers no emesis, no BM in about 2 days   OT comments  Pt agreeable to OT and PT co-treatment. Pt orthostatics taken and reported to nurse. Pt required more assist for bed mobility today. Pt seemed more lethargic and weak then last session. Pt also appeared to be bleeding from an unknown area. Nursing was notified of this. Pt will benefit from continued OT in the hospital and recommended venue below to increase strength, balance, and endurance for safe ADL's.      Recommendations for follow up therapy are one component of a multi-disciplinary discharge planning process, led by the attending physician.  Recommendations may be updated based on patient status, additional functional criteria and insurance authorization.    Follow Up Recommendations  Skilled nursing-short term rehab (<3 hours/day)     Assistance Recommended at Discharge Intermittent Supervision/Assistance  Patient can  return home with the following  A lot of help with walking and/or transfers;A lot of help with bathing/dressing/bathroom;Assistance with cooking/housework   Equipment Recommendations  None recommended by OT    Recommendations for Other Services      Precautions / Restrictions Precautions Precautions: Fall Restrictions Weight Bearing Restrictions: No       Mobility Bed Mobility Overal bed mobility: Needs Assistance Bed Mobility: Sit to Supine     Supine to sit: Mod assist, HOB elevated     General bed mobility comments: Assist to move legs to head of bed and pull torso to sit.    Transfers Overall transfer level: Needs assistance Equipment used: Rolling walker (2 wheels) Transfers: Sit to/from Stand, Bed to chair/wheelchair/BSC Sit to Stand: Mod assist, Max assist Stand pivot transfers: Mod assist, Max assist         General transfer comment: Much assist needed to pivot to chair. Extended standing due to obtaining orthostatics.     Balance Overall balance assessment: Needs assistance Sitting-balance support: Feet supported, No upper extremity supported Sitting balance-Leahy Scale: Fair Sitting balance - Comments: seated at EOB   Standing balance support: During functional activity, Reliant on assistive device for balance, Bilateral upper extremity supported Standing balance-Leahy Scale: Poor                               Cognition Arousal/Alertness: Awake/alert Behavior During Therapy: WFL for tasks assessed/performed Overall Cognitive Status: History of cognitive impairments - at baseline  Pertinent Vitals/ Pain       Pain Assessment Pain Assessment: No/denies pain                                                          Frequency  Min 2X/week        Progress Toward Goals  OT Goals(current goals can now be found in the care plan  section)  Progress towards OT goals: Progressing toward goals  Acute Rehab OT Goals Patient Stated Goal: To go to rehab. OT Goal Formulation: With patient Time For Goal Achievement: 12/23/22 Potential to Achieve Goals: Fair ADL Goals Pt Will Perform Upper Body Bathing: with set-up Pt Will Perform Lower Body Dressing: with min assist Pt Will Transfer to Toilet: with min assist  Plan Discharge plan remains appropriate    Co-evaluation    PT/OT/SLP Co-Evaluation/Treatment: Yes Reason for Co-Treatment: To address functional/ADL transfers   OT goals addressed during session: ADL's and self-care                         End of Session Equipment Utilized During Treatment: Rolling walker (2 wheels);Gait belt  OT Visit Diagnosis: Muscle weakness (generalized) (M62.81);Unsteadiness on feet (R26.81)   Activity Tolerance Patient tolerated treatment well   Patient Left in chair;with call bell/phone within reach   Nurse Communication Other (comment) (Notified that pt seemed to be bleading from and unknown area.)        Time: 0922-0946 OT Time Calculation (min): 24 min  Charges: OT General Charges $OT Visit: 1 Visit OT Treatments $Therapeutic Exercise: 8-22 mins  Johannes Everage OT, MOT  Larey Seat 12/15/2022, 10:25 AM

## 2022-12-15 NOTE — Progress Notes (Signed)
   12/15/22 0953  Orthostatic Lying   BP- Lying 102/55  Pulse- Lying 118  Orthostatic Sitting  BP- Sitting 96/74  Pulse- Sitting 120  Orthostatic Standing at 0 minutes  BP- Standing at 0 minutes 116/62  Pulse- Standing at 0 minutes 145

## 2022-12-15 NOTE — Progress Notes (Signed)
Physical Therapy Treatment Patient Details Name: [name] MRN: [id number] DOB: 1986/01/03 Today's Date: 12/15/2022   History of Present Illness [name]  is a 37 y.o. male with PMHx relevant for  HIV, Bipolar 1 disorder/Schizoaffective disorder/PTSD/Depression/anxiety , HTN, FTT, Chronic hidradenitis suppurativa involving groin creases, pubic region, buttocks , Anemia of chronic disease, alcoholic liver cirrhosis, GERD and history of orthostatic hypotension in the setting of autonomic dysfunction on chronic decubitus ulcers in the setting of hidradenitis suppurativa presents from home by EMS ??  Altered mentation in the setting of poor oral intake--- he apparently has a home health CNA--- who quit about a week ago show patient does not have sufficient care at home.  -Patient states she has not had oral intake for 2 days  --Apparently patient has been less awake and at times more confused  -He was covered in feces when EMS got there he had a low blood pressure EMS gave him NS bolus of 300 mL x 1  -No fevers no emesis, no BM in about 2 days    PT Comments    Patient demonstrates slow labored movement for sitting up at bedside with poor carryover for using bed rail due to weakness, very unsteady on feet and tolerated standing for up to 6-7 minutes while having orthostatic BP's taken, had to shuffle on LLE when taking side steps due to weakness and tolerated sitting up I chair after therapy.  Patient orthostatic BP's as follows lying 102/55, PR 118, sitting 96/74, PR 120, standing 116/62, PR 145, bleeding possibly from buttock wounds noted - nurse notified.  Patient will benefit from continued skilled physical therapy in hospital and recommended venue below to increase strength, balance, endurance for safe ADLs and gait.     Recommendations for follow up therapy are one component of a multi-disciplinary discharge planning process, led by the attending physician.  Recommendations may be updated  based on patient status, additional functional criteria and insurance authorization.  Follow Up Recommendations  Skilled nursing-short term rehab (<3 hours/day) Can patient physically be transported by private vehicle: No   Assistance Recommended at Discharge Intermittent Supervision/Assistance  Patient can return home with the following A lot of help with bathing/dressing/bathroom;A lot of help with walking and/or transfers;Help with stairs or ramp for entrance;Assistance with cooking/housework   Equipment Recommendations  None recommended by PT    Recommendations for Other Services       Precautions / Restrictions Precautions Precautions: Fall Restrictions Weight Bearing Restrictions: No     Mobility  Bed Mobility Overal bed mobility: Needs Assistance Bed Mobility: Sit to Supine     Supine to sit: Mod assist, HOB elevated Sit to supine: Mod assist   General bed mobility comments: slow labored movement with poor return for using side rail due to weakness    Transfers Overall transfer level: Needs assistance Equipment used: Rolling walker (2 wheels) Transfers: Sit to/from Stand, Bed to chair/wheelchair/BSC Sit to Stand: Mod assist, Max assist   Step pivot transfers: Mod assist, Max assist       General transfer comment: slow labored movement with diffiuclty moving LLE due to weakness    Ambulation/Gait Ambulation/Gait assistance: Max assist Gait Distance (Feet): 3 Feet Assistive device: Rolling walker (2 wheels) Gait Pattern/deviations: Decreased step length - right, Decreased step length - left, Decreased stride length Gait velocity: slow     General Gait Details: limited to a few unsteady labored side steps requiring tactile assistance to move LLE due to weakness  Stairs             Wheelchair Mobility    Modified Rankin (Stroke Patients Only)       Balance Overall balance assessment: Needs assistance Sitting-balance support: Feet  supported, No upper extremity supported Sitting balance-Leahy Scale: Fair Sitting balance - Comments: seated at EOB   Standing balance support: Reliant on assistive device for balance, During functional activity, Bilateral upper extremity supported Standing balance-Leahy Scale: Poor Standing balance comment: fair/poor using RW                            Cognition Arousal/Alertness: Awake/alert Behavior During Therapy: WFL for tasks assessed/performed Overall Cognitive Status: History of cognitive impairments - at baseline                                          Exercises      General Comments        Pertinent Vitals/Pain Pain Assessment Pain Assessment: No/denies pain    Home Living                          Prior Function            PT Goals (current goals can now be found in the care plan section) Acute Rehab PT Goals Patient Stated Goal: return home after rehab PT Goal Formulation: With patient Time For Goal Achievement: 12/21/22 Potential to Achieve Goals: Good Progress towards PT goals: Progressing toward goals    Frequency    Min 3X/week      PT Plan Current plan remains appropriate    Co-evaluation PT/OT/SLP Co-Evaluation/Treatment: Yes Reason for Co-Treatment: To address functional/ADL transfers PT goals addressed during session: Mobility/safety with mobility;Balance;Proper use of DME OT goals addressed during session: ADL's and self-care      AM-PAC PT "6 Clicks" Mobility   Outcome Measure  Help needed turning from your back to your side while in a flat bed without using bedrails?: A Lot Help needed moving from lying on your back to sitting on the side of a flat bed without using bedrails?: A Lot Help needed moving to and from a bed to a chair (including a wheelchair)?: A Lot Help needed standing up from a chair using your arms (e.g., wheelchair or bedside chair)?: A Lot Help needed to walk in hospital  room?: A Lot Help needed climbing 3-5 steps with a railing? : Total 6 Click Score: 11    End of Session Equipment Utilized During Treatment: Gait belt Activity Tolerance: Patient tolerated treatment well;Patient limited by fatigue Patient left: with call bell/phone within reach;in chair Nurse Communication: Mobility status PT Visit Diagnosis: Unsteadiness on feet (R26.81);Other abnormalities of gait and mobility (R26.89);Muscle weakness (generalized) (M62.81)     Time: [id number] PT Time Calculation (min) (ACUTE ONLY): 25 min  Charges:  $Therapeutic Activity: 23-37 mins                     10:48 AM, 12/15/22 Lonell Grandchild, MPT Physical Therapist with [facility] [phone] office [phone] mobile phone

## 2022-12-15 NOTE — Discharge Summary (Signed)
Physician Discharge Summary  [name] [id number] DOB: Jul 22, 1986 DOA: 12/06/2022  PCP: [name], MD  Admit date: 12/06/2022  Discharge date: 12/15/2022  Admitted From:Home  Disposition:  SNF  Recommendations for Outpatient Follow-up:  Follow up with PCP in 1-2 weeks Continue on midodrine 15 mg 3 times daily for orthostasis/autonomic dysfunction and continue to wear TED hoses daily No further need for antibiotics on discharge as patient has completed total 10 days of antibiotics for proctitis as well as UTI Continue other home medications as prior with scripts sent for narcotics  Home Health: None  Equipment/Devices: None  Discharge Condition:Stable  CODE STATUS: Full  Diet recommendation: Heart Healthy  Brief/Interim Summary:  [name] is  a 37 year old male apparently bedbound from home with significant history of HIV,  , HTN, FTT, Chronic hidradenitis suppurativa involving groin creases, pubic region, buttocks , Anemia of chronic disease, alcoholic liver cirrhosis, GERD and history of orthostatic hypotension in the setting of autonomic dysfunction on chronic decubitus ulcers in the setting of hidradenitis suppurativa presents from home by EMS with altered mentation in the setting of poor oral intake.  He was admitted with acute metabolic encephalopathy secondary to noted proctitis and UTI in the setting of HIV.  He appears to have orthostatic hypotension of as well as autonomic dysfunction and is primarily bedbound at home.  He has completed course of antibiotics for his infections over the course of 10 days and has been seen by PT/OT with recommendations for SNF/rehab on discharge to which she is agreeable.  His discharge was delayed on account of some persistent orthostasis and he required IV fluid boluses to assist with improvement.  He is now on midodrine 3 times daily as noted above and should demonstrate continual improvement with use of this medication as  well as TED hoses.  His oral intake has stabilized as well.  No other acute events or concerns noted.  Discharge Diagnoses:  Principal Problem:   Acute metabolic encephalopathy Active Problems:   Anemia   UTI (urinary tract infection)   Protein-calorie malnutrition, severe (HCC)   GERD (gastroesophageal reflux disease)   HIV disease (HCC)   Avascular necrosis of femoral head, left (HCC)   FTT (failure to thrive) in adult   Alcoholic cirrhosis of liver (HCC)   Bipolar disorder (Adrian)  Principal discharge diagnosis: Acute metabolic encephalopathy secondary to UTI/proctitis as well as persistent orthostasis/autonomic dysfunction.  Discharge Instructions  Discharge Instructions     Diet - low sodium heart healthy   Complete by: As directed    Discharge wound care:   Complete by: As directed    Cleanse wounds to perineal and buttocks area, bilateral hips, left knee with soap and water and pat dry. Apply aquacel Ag to open areas (LAWSON # [id number]) and cover with gauze and ABD pad or kerlix and tape.  Change Monday/WEd/Friday and PRN soilage.  Offload pressure to bilateral heels with prevalon boots  Needs mattress with low air loss feature.   Increase activity slowly   Complete by: As directed       Allergies as of 12/15/2022       Reactions   Aczone [dapsone] Other (See Comments)   Per Centricity, "G6PD deficient"   Pollen Extract Itching, Other (See Comments)   Sneezing   Primaquine Phosphate Other (See Comments)   Per Centricity, "G6PD deficient"        Medication List     STOP taking these medications    haloperidol 5 MG  tablet Commonly known as: HALDOL       TAKE these medications    acetaminophen 500 MG tablet Commonly known as: TYLENOL Take 1,000 mg by mouth every 6 (six) hours as needed for moderate pain or mild pain.   albuterol 108 (90 Base) MCG/ACT inhaler Commonly known as: VENTOLIN HFA Inhale 1 puff into the lungs every 6 (six) hours as needed for  wheezing or shortness of breath.   ALPRAZolam 0.5 MG tablet Commonly known as: XANAX Take 1 tablet (0.5 mg total) by mouth 3 (three) times daily as needed for anxiety.   Biktarvy 50-200-25 MG Tabs tablet Generic drug: bictegravir-emtricitabine-tenofovir AF Take 1 tablet by mouth daily.   cyclobenzaprine 5 MG tablet Commonly known as: FLEXERIL Take 1 tablet (5 mg total) by mouth 3 (three) times daily as needed for muscle spasms. What changed:  medication strength how much to take reasons to take this   diphenoxylate-atropine 2.5-0.025 MG tablet Commonly known as: LOMOTIL Take 1 tablet by mouth daily.   escitalopram 20 MG tablet Commonly known as: LEXAPRO Take 1 tablet (20 mg total) by mouth daily.   famotidine 40 MG tablet Commonly known as: PEPCID Take 40 mg by mouth every morning.   feeding supplement Liqd Take 237 mLs by mouth 2 (two) times daily between meals.   ferrous sulfate 325 (65 FE) MG tablet Take 1 tablet (325 mg total) by mouth daily with breakfast. Start taking on: March 8, 123456   folic acid 1 MG tablet Commonly known as: FOLVITE Take 1 tablet (1 mg total) by mouth daily.   furosemide 40 MG tablet Commonly known as: LASIX Take 40 mg by mouth daily as needed.   gabapentin 100 MG capsule Commonly known as: NEURONTIN Take 1 capsule (100 mg total) by mouth 2 (two) times daily.   HYDROcodone-acetaminophen 5-325 MG tablet Commonly known as: NORCO/VICODIN Take 1 tablet by mouth every 6 (six) hours as needed for moderate pain. What changed:  when to take this reasons to take this   ibuprofen 200 MG tablet Commonly known as: ADVIL Take 600 mg by mouth every 6 (six) hours as needed for mild pain or moderate pain.   lip balm ointment Apply topically as needed for lip care.   Lurasidone HCl 120 MG Tabs Take 1 tablet (120 mg total) by mouth daily.   midodrine 5 MG tablet Commonly known as: PROAMATINE Take 3 tablets (15 mg total) by mouth 3 (three)  times daily with meals.   multivitamin with minerals Tabs tablet Take 1 tablet by mouth daily.   nystatin powder Commonly known as: MYCOSTATIN/NYSTOP Apply topically 2 (two) times daily.   ondansetron 8 MG tablet Commonly known as: ZOFRAN Take 8 mg by mouth every 8 (eight) hours as needed.   pantoprazole 40 MG tablet Commonly known as: PROTONIX Take 1 tablet (40 mg total) by mouth daily.   polyethylene glycol 17 g packet Commonly known as: MIRALAX / GLYCOLAX Take 17 g by mouth daily as needed for mild constipation.   Senexon-S 8.6-50 MG tablet Generic drug: senna-docusate Take 1 tablet by mouth at bedtime as needed for moderate constipation.   SYSTANE OP Place 1-2 drops into both eyes daily as needed (Dry eyes).   temazepam 30 MG capsule Commonly known as: RESTORIL Take 1 capsule (30 mg total) by mouth at bedtime.   thiamine 100 MG tablet Commonly known as: VITAMIN B1 Take 1 tablet (100 mg total) by mouth daily.   traZODone 100 MG tablet Commonly  known as: DESYREL Take 1 tablet (100 mg total) by mouth at bedtime. What changed:  medication strength how much to take   trolamine salicylate 10 % cream Commonly known as: ASPERCREME Apply 1 Application topically as needed for muscle pain.   valACYclovir 1000 MG tablet Commonly known as: VALTREX Take 1 tablet (1,000 mg total) by mouth daily.               Discharge Care Instructions  (From admission, onward)           Start     Ordered   12/15/22 0000  Discharge wound care:       Comments: Cleanse wounds to perineal and buttocks area, bilateral hips, left knee with soap and water and pat dry. Apply aquacel Ag to open areas (LAWSON # [id number]) and cover with gauze and ABD pad or kerlix and tape.  Change Monday/WEd/Friday and PRN soilage.  Offload pressure to bilateral heels with prevalon boots  Needs mattress with low air loss feature.   12/15/22 1037            Follow-up Information     [name], MD. Schedule an appointment as soon as possible for a visit in 1 week(s).   Specialty: Internal Medicine Contact information: [place] [address] [phone]                Allergies  Allergen Reactions   Aczone [Dapsone] Other (See Comments)    Per Centricity, "G6PD deficient"   Pollen Extract Itching and Other (See Comments)    Sneezing   Primaquine Phosphate Other (See Comments)    Per Centricity, "G6PD deficient"    Consultations: None   Procedures/Studies: ECHOCARDIOGRAM COMPLETE  Result Date: 12/14/2022    ECHOCARDIOGRAM REPORT   Patient Name:   [name] Date of Exam: 12/14/2022 Medical Rec #:  [id number]        Height:       75.0 in Accession #:    [id number]       Weight:       147.7 lb Date of Birth:  04-Apr-1986        BSA:          1.929 m Patient Age:    37 years         BP:           110/78 mmHg Patient Gender: M                HR:           112 bpm. Exam Location:  [facility] Procedure: 2D Echo, Cardiac Doppler and Color Doppler Indications:    Other abnormalities of the heart R00.8  History:        Patient has prior history of Echocardiogram examinations, most                 recent 05/08/2021. HIV, Signs/Symptoms:Altered Mental Status;                 Risk Factors:Dyslipidemia and Hypertension.  Sonographer:    Greer Pickerel Referring Phys: [id number] [facility]  Sonographer Comments: Image acquisition challenging due to patient body habitus, Image acquisition challenging due to uncooperative patient and Image acquisition challenging due to respiratory motion. IMPRESSIONS  1. Left ventricular ejection fraction, by estimation, is 65 to 70%. The left ventricle has normal function. Left ventricular endocardial border not optimally defined to evaluate regional wall motion. Left ventricular diastolic parameters are  indeterminate.  2. Right ventricular systolic function was not well visualized. The right ventricular size is not well visualized.   3. The mitral valve was not well visualized. No evidence of mitral valve regurgitation. No evidence of mitral stenosis.  4. The aortic valve was not well visualized. Aortic valve regurgitation is not visualized. No aortic stenosis is present.  5. Pulmonic valve regurgitation not well visualised.  6. Technically limited study, patient refused echo contrast. FINDINGS  Left Ventricle: Left ventricular ejection fraction, by estimation, is 65 to 70%. The left ventricle has normal function. Left ventricular endocardial border not optimally defined to evaluate regional wall motion. The left ventricular internal cavity size was normal in size. There is no left ventricular hypertrophy. Left ventricular diastolic parameters are indeterminate. Right Ventricle: The right ventricular size is not well visualized. Right vetricular wall thickness was not well visualized. Right ventricular systolic function was not well visualized. Left Atrium: Left atrial size was not well visualized. Right Atrium: Right atrial size was not well visualized. Pericardium: The pericardium was not well visualized. Mitral Valve: The mitral valve was not well visualized. No evidence of mitral valve regurgitation. No evidence of mitral valve stenosis. Tricuspid Valve: The tricuspid valve is not well visualized. Tricuspid valve regurgitation is not demonstrated. No evidence of tricuspid stenosis. Aortic Valve: The aortic valve was not well visualized. Aortic valve regurgitation is not visualized. No aortic stenosis is present. Aortic valve mean gradient measures 2.6 mmHg. Aortic valve peak gradient measures 4.3 mmHg. Pulmonic Valve: The pulmonic valve was not well visualized. Pulmonic valve regurgitation not well visualised. Aorta: The aortic root was not well visualized. IAS/Shunts: The interatrial septum was not well visualized.  LEFT ATRIUM           Index LA Vol (A4C): 66.5 ml 34.47 ml/m  AORTIC VALVE AV Vmax:           103.76 cm/s AV Vmean:           75.575 cm/s AV VTI:            0.160 m AV Peak Grad:      4.3 mmHg AV Mean Grad:      2.6 mmHg LVOT Vmax:         91.30 cm/s LVOT Vmean:        57.400 cm/s LVOT VTI:          0.115 m LVOT/AV VTI ratio: 0.72 MITRAL VALVE MV Area (PHT): 5.75 cm     SHUNTS MV Decel Time: 132 msec     Systemic VTI: 0.12 m MR Peak grad: 3.6 mmHg MR Vmax:      95.30 cm/s MV E velocity: 53.60 cm/s MV A velocity: 100.00 cm/s MV E/A ratio:  0.54 Carlyle Dolly MD Electronically signed by Carlyle Dolly MD Signature Date/Time: 12/14/2022/4:36:26 PM    Final    CT ABDOMEN PELVIS W CONTRAST  Result Date: 12/06/2022 CLINICAL DATA:  Sepsis, not eating for 2 days; history of PTSD, substance abuse, psychiatric disease, EXAM: CT ABDOMEN AND PELVIS WITH CONTRAST TECHNIQUE: Multidetector CT imaging of the abdomen and pelvis was performed using the standard protocol following bolus administration of intravenous contrast. RADIATION DOSE REDUCTION: This exam was performed according to the departmental dose-optimization program which includes automated exposure control, adjustment of the mA and/or kV according to patient size and/or use of iterative reconstruction technique. CONTRAST:  17m OMNIPAQUE IOHEXOL 300 MG/ML SOLN IV. No oral contrast. COMPARISON:  06/27/2022 FINDINGS: Lower chest: Lung bases clear Hepatobiliary: Gallbladder and  liver normal appearance Pancreas: Normal appearance Spleen: Normal appearance Adrenals/Urinary Tract: Bladder decompressed with minimal dependent density which could represent tiny calculi or mural calcification. Adrenal glands, kidneys, and ureters unremarkable. No renal mass, hydronephrosis or urinary tract calcification. Stomach/Bowel: Mild rectal wall thickening. Normal appendix. Stomach and remaining bowel loops unremarkable. Vascular/Lymphatic: Premature atherosclerotic calcifications aorta and iliac arteries without aneurysm. External iliac and inguinal adenopathy bilaterally increased from previous exam.  LEFT external iliac node 17 mm image 81. RIGHT external iliac node 13 mm image 80. Scattered normal sized retroperitoneal nodes. Reproductive: Unremarkable prostate gland and seminal vesicles Other: No free air or free fluid.  No hernia. Musculoskeletal: Advanced degenerative changes of the LEFT hip joint secondary to avascular necrosis with bone destruction at femoral head, joint space narrowing, articular surface irregularity, and associated joint effusion/debris. IMPRESSION: Mild rectal wall thickening question proctitis. Increased external iliac and inguinal adenopathy; this could be reactive or related to HIV but CT is unable to exclude additional etiologies such as lymphoma and metastatic disease. Question dependent calculi in urinary bladder versus mural calcification. 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 Aortic Atherosclerosis (ICD10-I70.0). Electronically Signed   By: Lavonia Dana M.D.   On: 12/06/2022 13:51   DG Chest Port 1 View  Result Date: 12/06/2022 CLINICAL DATA:  Sepsis. EXAM: PORTABLE CHEST 1 VIEW COMPARISON:  August 26, 2022. FINDINGS: The heart size and mediastinal contours are within normal limits. Both lungs are clear. The visualized skeletal structures are unremarkable. IMPRESSION: No active disease. Electronically Signed   By: Marijo Conception M.D.   On: 12/06/2022 10:52     Discharge Exam: Vitals:   12/15/22 0154 12/15/22 0556  BP: 111/73 105/68  Pulse: (!) 126 (!) 105  Resp: 19 19  Temp: 98.1 F (36.7 C) 98.2 F (36.8 C)  SpO2: 100% 100%   Vitals:   12/14/22 1326 12/14/22 2248 12/15/22 0154 12/15/22 0556  BP: 110/78 91/70 111/73 105/68  Pulse: (!) 117 (!) 122 (!) 126 (!) 105  Resp:  '19 19 19  '$ Temp: 99 F (37.2 C) 98.5 F (36.9 C) 98.1 F (36.7 C) 98.2 F (36.8 C)  TempSrc: Oral Oral Oral Oral  SpO2: 100% 98% 100% 100%  Weight:      Height:         General: Pt is alert, awake, not in acute distress Cardiovascular: RRR, S1/S2 +, no rubs, no gallops Respiratory: CTA bilaterally, no wheezing, no rhonchi Abdominal: Soft, NT, ND, bowel sounds + Extremities: no edema, no cyanosis    The results of significant diagnostics from this hospitalization (including imaging, microbiology, ancillary and laboratory) are listed below for reference.     Microbiology: Recent Results (from the past 240 hour(s))  Resp panel by RT-PCR (RSV, Flu A&B, Covid) Anterior Nasal Swab     Status: None   Collection Time: 12/06/22 10:56 AM   Specimen: Anterior Nasal Swab  Result Value Ref Range Status   SARS Coronavirus 2 by RT PCR NEGATIVE NEGATIVE Final    Comment: (NOTE) SARS-CoV-2 target nucleic acids are NOT DETECTED.  The SARS-CoV-2 RNA is generally detectable in upper respiratory specimens during the acute phase of infection. The lowest concentration of SARS-CoV-2 viral copies this assay can detect is 138 copies/mL. A negative result does not preclude SARS-Cov-2 infection and should not be used as the sole basis for treatment or other patient management decisions. A negative result may  occur with  improper specimen collection/handling, submission of specimen other than nasopharyngeal swab, presence of viral mutation(s) within the areas targeted by this assay, and inadequate number of viral copies(<138 copies/mL). A negative result must be combined with clinical observations, patient history, and epidemiological information. The expected result is Negative.  Fact Sheet for Patients:  EntrepreneurPulse.com.au  Fact Sheet for Healthcare Providers:  IncredibleEmployment.be  This test is no t yet approved or cleared by the [place] FDA and  has been authorized for detection and/or diagnosis of SARS-CoV-2 by FDA under an Emergency Use Authorization (EUA). This EUA will remain  in effect (meaning this  test can be used) for the duration of the COVID-19 declaration under Section 564(b)(1) of the Act, 21 U.S.C.section 360bbb-3(b)(1), unless the authorization is terminated  or revoked sooner.       Influenza A by PCR NEGATIVE NEGATIVE Final   Influenza B by PCR NEGATIVE NEGATIVE Final    Comment: (NOTE) The Xpert Xpress SARS-CoV-2/FLU/RSV plus assay is intended as an aid in the diagnosis of influenza from Nasopharyngeal swab specimens and should not be used as a sole basis for treatment. Nasal washings and aspirates are unacceptable for Xpert Xpress SARS-CoV-2/FLU/RSV testing.  Fact Sheet for Patients: EntrepreneurPulse.com.au  Fact Sheet for Healthcare Providers: IncredibleEmployment.be  This test is not yet approved or cleared by the [place] FDA and has been authorized for detection and/or diagnosis of SARS-CoV-2 by FDA under an Emergency Use Authorization (EUA). This EUA will remain in effect (meaning this test can be used) for the duration of the COVID-19 declaration under Section 564(b)(1) of the Act, 21 U.S.C. section 360bbb-3(b)(1), unless the authorization is terminated or revoked.     Resp Syncytial Virus by PCR NEGATIVE NEGATIVE Final    Comment: (NOTE) Fact Sheet for Patients: EntrepreneurPulse.com.au  Fact Sheet for Healthcare Providers: IncredibleEmployment.be  This test is not yet approved or cleared by the [place] FDA and has been authorized for detection and/or diagnosis of SARS-CoV-2 by FDA under an Emergency Use Authorization (EUA). This EUA will remain in effect (meaning this test can be used) for the duration of the COVID-19 declaration under Section 564(b)(1) of the Act, 21 U.S.C. section 360bbb-3(b)(1), unless the authorization is terminated or revoked.  Performed at [facility], [address], [place], [place] [address]   Blood Culture (routine x 2)     Status:  None   Collection Time: 12/06/22 11:17 AM   Specimen: BLOOD  Result Value Ref Range Status   Specimen Description BLOOD RIGHT ASSIST CONTROL  Final   Special Requests   Final    BOTTLES DRAWN AEROBIC AND ANAEROBIC Blood Culture results may not be optimal due to an excessive volume of blood received in culture bottles   Culture   Final    NO GROWTH 5 DAYS Performed at [facility], [address], [place], [place] [address]    Report Status 12/11/2022 FINAL  Final  Blood Culture (routine x 2)     Status: None (Preliminary result)   Collection Time: 12/06/22 11:17 AM   Specimen: BLOOD  Result Value Ref Range Status   Specimen Description   Final    BLOOD BLOOD RIGHT ARM Performed at [facility], [address] [place], [place] [address]    Special Requests   Final    BOTTLES DRAWN AEROBIC AND ANAEROBIC Blood Culture adequate volume Performed at [facility], [address], [place], [place] [address]    Culture  Setup Time  Final    GRAM POSITIVE RODS Gram Stain Report Called to,Read Back By and Verified With: CATES LAND M ON 12/07/22 AT 2230 BY LOY,C AEROBIC BOTTLE PERFORMED AT [facility]    Culture   Final    GRAM POSITIVE RODS sent to [facility] for identification Performed at [facility], [address], [place], [place] [address]    Report Status PENDING  Incomplete  Urine Culture     Status: Abnormal   Collection Time: 12/06/22  2:00 PM   Specimen: Urine, Random  Result Value Ref Range Status   Specimen Description   Final    URINE, RANDOM Performed at [facility], [address] [place], [place] [address]    Special Requests   Final    NONE Reflexed from [id number] Performed at [facility], [address] [place], [place] [address]    Culture MULTIPLE SPECIES PRESENT, SUGGEST RECOLLECTION (A)  Final   Report Status 12/08/2022 FINAL  Final     Labs: BNP (last 3 results) No results for input(s): "BNP" in the last 8760 hours. Basic Metabolic Panel: Recent Labs   Lab 12/09/22 0443 12/10/22 0410 12/11/22 0435 12/11/22 0733 12/12/22 0458 12/15/22 0443  NA 136 135 134*  --  133* 134*  K 3.3* 4.0 4.2  --  3.7 3.6  CL 103 104 100  --  99 105  CO2 '28 23 27  '$ --  26 23  GLUCOSE 85 78 78  --  94 82  BUN 12 13 21*  --  20 18  CREATININE 0.79 0.84 0.89  --  1.09 0.82  CALCIUM 8.0* 8.3* 8.7*  --  8.7* 8.3*  MG  --   --   --  2.0  --  1.8   Liver Function Tests: No results for input(s): "AST", "ALT", "ALKPHOS", "BILITOT", "PROT", "ALBUMIN" in the last 168 hours. No results for input(s): "LIPASE", "AMYLASE" in the last 168 hours. No results for input(s): "AMMONIA" in the last 168 hours. CBC: Recent Labs  Lab 12/10/22 0410 12/11/22 0435 12/12/22 0458 12/13/22 0839 12/15/22 0443  WBC 12.1* 13.7* 13.3* 12.8* 14.1*  HGB 9.1* 9.6* 9.4* 9.0* 8.3*  HCT 29.6* 31.2* 31.5* 29.5* 27.6*  MCV 91.9 92.3 92.9 92.8 94.8  PLT 447* 446* 466* 475* 437*   Cardiac Enzymes: No results for input(s): "CKTOTAL", "CKMB", "CKMBINDEX", "TROPONINI" in the last 168 hours. BNP: Invalid input(s): "POCBNP" CBG: No results for input(s): "GLUCAP" in the last 168 hours. D-Dimer No results for input(s): "DDIMER" in the last 72 hours. Hgb A1c No results for input(s): "HGBA1C" in the last 72 hours. Lipid Profile No results for input(s): "CHOL", "HDL", "LDLCALC", "TRIG", "CHOLHDL", "LDLDIRECT" in the last 72 hours. Thyroid function studies No results for input(s): "TSH", "T4TOTAL", "T3FREE", "THYROIDAB" in the last 72 hours.  Invalid input(s): "FREET3" Anemia work up No results for input(s): "VITAMINB12", "FOLATE", "FERRITIN", "TIBC", "IRON", "RETICCTPCT" in the last 72 hours. Urinalysis    Component Value Date/Time   COLORURINE YELLOW 12/13/2022 1925   APPEARANCEUR HAZY (A) 12/13/2022 1925   LABSPEC 1.024 12/13/2022 1925   PHURINE 5.0 12/13/2022 1925   GLUCOSEU NEGATIVE 12/13/2022 1925   HGBUR NEGATIVE 12/13/2022 1925   BILIRUBINUR NEGATIVE 12/13/2022 1925    KETONESUR 5 (A) 12/13/2022 1925   PROTEINUR 30 (A) 12/13/2022 1925   UROBILINOGEN 0.2 12/21/2014 1006   NITRITE NEGATIVE 12/13/2022 1925   LEUKOCYTESUR MODERATE (A) 12/13/2022 1925   Sepsis Labs Recent Labs  Lab 12/11/22 0435 12/12/22 0458 12/13/22  [id number] 12/15/22 0443  WBC 13.7* 13.3* 12.8* 14.1*   Microbiology Recent Results (from the past 240 hour(s))  Resp panel by RT-PCR (RSV, Flu A&B, Covid) Anterior Nasal Swab     Status: None   Collection Time: 12/06/22 10:56 AM   Specimen: Anterior Nasal Swab  Result Value Ref Range Status   SARS Coronavirus 2 by RT PCR NEGATIVE NEGATIVE Final    Comment: (NOTE) SARS-CoV-2 target nucleic acids are NOT DETECTED.  The SARS-CoV-2 RNA is generally detectable in upper respiratory specimens during the acute phase of infection. The lowest concentration of SARS-CoV-2 viral copies this assay can detect is 138 copies/mL. A negative result does not preclude SARS-Cov-2 infection and should not be used as the sole basis for treatment or other patient management decisions. A negative result may occur with  improper specimen collection/handling, submission of specimen other than nasopharyngeal swab, presence of viral mutation(s) within the areas targeted by this assay, and inadequate number of viral copies(<138 copies/mL). A negative result must be combined with clinical observations, patient history, and epidemiological information. The expected result is Negative.  Fact Sheet for Patients:  EntrepreneurPulse.com.au  Fact Sheet for Healthcare Providers:  IncredibleEmployment.be  This test is no t yet approved or cleared by the [place] FDA and  has been authorized for detection and/or diagnosis of SARS-CoV-2 by FDA under an Emergency Use Authorization (EUA). This EUA will remain  in effect (meaning this test can be used) for the duration of the COVID-19 declaration under Section 564(b)(1) of the Act,  21 U.S.C.section 360bbb-3(b)(1), unless the authorization is terminated  or revoked sooner.       Influenza A by PCR NEGATIVE NEGATIVE Final   Influenza B by PCR NEGATIVE NEGATIVE Final    Comment: (NOTE) The Xpert Xpress SARS-CoV-2/FLU/RSV plus assay is intended as an aid in the diagnosis of influenza from Nasopharyngeal swab specimens and should not be used as a sole basis for treatment. Nasal washings and aspirates are unacceptable for Xpert Xpress SARS-CoV-2/FLU/RSV testing.  Fact Sheet for Patients: EntrepreneurPulse.com.au  Fact Sheet for Healthcare Providers: IncredibleEmployment.be  This test is not yet approved or cleared by the [place] FDA and has been authorized for detection and/or diagnosis of SARS-CoV-2 by FDA under an Emergency Use Authorization (EUA). This EUA will remain in effect (meaning this test can be used) for the duration of the COVID-19 declaration under Section 564(b)(1) of the Act, 21 U.S.C. section 360bbb-3(b)(1), unless the authorization is terminated or revoked.     Resp Syncytial Virus by PCR NEGATIVE NEGATIVE Final    Comment: (NOTE) Fact Sheet for Patients: EntrepreneurPulse.com.au  Fact Sheet for Healthcare Providers: IncredibleEmployment.be  This test is not yet approved or cleared by the [place] FDA and has been authorized for detection and/or diagnosis of SARS-CoV-2 by FDA under an Emergency Use Authorization (EUA). This EUA will remain in effect (meaning this test can be used) for the duration of the COVID-19 declaration under Section 564(b)(1) of the Act, 21 U.S.C. section 360bbb-3(b)(1), unless the authorization is terminated or revoked.  Performed at [facility], [address], [place], [place] [address]   Blood Culture (routine x 2)     Status: None   Collection Time: 12/06/22 11:17 AM   Specimen: BLOOD  Result Value Ref Range Status    Specimen Description BLOOD RIGHT ASSIST CONTROL  Final   Special Requests   Final    BOTTLES DRAWN AEROBIC AND ANAEROBIC Blood Culture results may not be optimal due to  an excessive volume of blood received in culture bottles   Culture   Final    NO GROWTH 5 DAYS Performed at [facility], [address], [place], [place] [address]    Report Status 12/11/2022 FINAL  Final  Blood Culture (routine x 2)     Status: None (Preliminary result)   Collection Time: 12/06/22 11:17 AM   Specimen: BLOOD  Result Value Ref Range Status   Specimen Description   Final    BLOOD BLOOD RIGHT ARM Performed at [facility], [address] [place], [place] [address]    Special Requests   Final    BOTTLES DRAWN AEROBIC AND ANAEROBIC Blood Culture adequate volume Performed at [facility], [address], [place], [place] [address]    Culture  Setup Time   Final    GRAM POSITIVE RODS Gram Stain Report Called to,Read Back By and Verified With: CATES LAND M ON 12/07/22 AT 2230 BY LOY,C AEROBIC BOTTLE PERFORMED AT [facility]    Culture   Final    GRAM POSITIVE RODS sent to [facility] for identification Performed at [facility], [address] [place], [place] [address]    Report Status PENDING  Incomplete  Urine Culture     Status: Abnormal   Collection Time: 12/06/22  2:00 PM   Specimen: Urine, Random  Result Value Ref Range Status   Specimen Description   Final    URINE, RANDOM Performed at [facility], [address] [place], [place] [address]    Special Requests   Final    NONE Reflexed from [id number] Performed at [facility], [address] [place],  [address]    Culture MULTIPLE SPECIES PRESENT, SUGGEST RECOLLECTION (A)  Final   Report Status 12/08/2022 FINAL  Final     Time coordinating discharge: 35 minutes  SIGNED:   Rodena Goldmann, DO [facility] 12/15/2022, 10:48 AM  If 7PM-7AM, please contact night-coverage [email or web address]

## 2022-12-15 NOTE — TOC Transition Note (Addendum)
Transition of Care [facility]) - CM/SW Discharge Note   Patient Details  Name: [name] MRN: [id number] Date of Birth: [birth year]  [facility] CM/SW Contact:  Iona Beard, [place] Phone Number: 12/15/2022, 11:41 AM   Clinical Narrative:    CSW updated that pt is medically ready for D/C today. CSW confirmed with Whitney at central intake that [facility] can accept pt today. CSW updated pts mother who is understanding and agreeable to the plan. Pts medications are being sent to [place]. Pts mother states someone will pick them up and deliver them to the facility for pt. CSW updated Rahul with [facility] who is aware pt will arrive today. CSW spoke to [place] with [facility] who states they will not have a truck to get pt today. CSW spoke to [place] with [facility] who confirms pt can arrive via wheelchair [place] and facility staff can assist in getting him out of the wheelchair. CSW updated RN who states pt can ride to facility via wheelchair. [facility] called for transport. CSW updated Brandi with DSS of D/C plan. TOC signing off.   Final next level of care: Skilled Nursing Facility Barriers to Discharge: Continued Medical Work up   Patient Goals and CMS Choice CMS Medicare.gov Compare Post Acute Care list provided to:: Patient Choice offered to / list presented to : Patient, Parent  Discharge Placement                  Patient to be transferred to facility by: [facility] Name of family member notified: [name] (mother) Patient and family notified of of transfer: 12/15/22  Discharge Plan and Services Additional resources added to the After Visit Summary for                                       Social Determinants of Health (SDOH) Interventions SDOH Screenings   Alcohol Screen: Medium Risk (07/22/2018)  Tobacco Use: High Risk (12/06/2022)     Readmission Risk Interventions    02/15/2022    2:35 PM 08/24/2021    4:00 PM  08/24/2021   11:54 AM  Readmission Risk Prevention Plan  Transportation Screening Complete Complete   PCP or Specialist Appt within 3-5 Days  Complete Complete  HRI or [place]  Complete Complete  Social Work Consult for [place] Planning/Counseling   Complete  Palliative Care Screening  Complete Complete  Medication Review Press photographer) Complete Complete Complete  PCP or Specialist appointment within 3-5 days of discharge Complete    HRI or [place] Complete    SW Recovery Care/Counseling Consult Complete    [place] Not Applicable

## 2022-12-16 LAB — BACTERIAL ORGANISM REFLEX

## 2022-12-16 LAB — ORGANISM ID, BACTERIA

## 2022-12-28 LAB — AEROBIC BACTERIA, ID BY SEQ.

## 2022-12-29 ENCOUNTER — Encounter (HOSPITAL_COMMUNITY): Payer: Self-pay | Admitting: *Deleted

## 2022-12-29 ENCOUNTER — Emergency Department (HOSPITAL_COMMUNITY): Payer: Medicare HMO

## 2022-12-29 ENCOUNTER — Emergency Department (HOSPITAL_COMMUNITY)
Admission: EM | Admit: 2022-12-29 | Discharge: 2022-12-29 | Disposition: A | Discharge status: Skilled Nursing Facility | Payer: Medicare HMO | Attending: Emergency Medicine | Admitting: Emergency Medicine

## 2022-12-29 ENCOUNTER — Other Ambulatory Visit: Payer: Self-pay

## 2022-12-29 DIAGNOSIS — W19XXXA Unspecified fall, initial encounter: Secondary | ICD-10-CM | POA: Insufficient documentation

## 2022-12-29 DIAGNOSIS — M542 Cervicalgia: Secondary | ICD-10-CM | POA: Diagnosis not present

## 2022-12-29 DIAGNOSIS — Z21 Asymptomatic human immunodeficiency virus [HIV] infection status: Secondary | ICD-10-CM | POA: Diagnosis not present

## 2022-12-29 DIAGNOSIS — S0093XA Contusion of unspecified part of head, initial encounter: Secondary | ICD-10-CM | POA: Diagnosis not present

## 2022-12-29 DIAGNOSIS — I10 Essential (primary) hypertension: Secondary | ICD-10-CM | POA: Insufficient documentation

## 2022-12-29 DIAGNOSIS — M25552 Pain in left hip: Secondary | ICD-10-CM | POA: Diagnosis not present

## 2022-12-29 DIAGNOSIS — M545 Low back pain, unspecified: Secondary | ICD-10-CM | POA: Diagnosis not present

## 2022-12-29 DIAGNOSIS — S0990XA Unspecified injury of head, initial encounter: Secondary | ICD-10-CM | POA: Diagnosis present

## 2022-12-29 LAB — URINALYSIS, ROUTINE W REFLEX MICROSCOPIC
Bilirubin Urine: NEGATIVE
Glucose, UA: NEGATIVE mg/dL
Hgb urine dipstick: NEGATIVE
Ketones, ur: NEGATIVE mg/dL
Nitrite: NEGATIVE
Protein, ur: NEGATIVE mg/dL
Specific Gravity, Urine: 1.009 (ref 1.005–1.030)
pH: 6 (ref 5.0–8.0)

## 2022-12-29 LAB — CBC WITH DIFFERENTIAL/PLATELET
Abs Immature Granulocytes: 0.02 10*3/uL (ref 0.00–0.07)
Basophils Absolute: 0 10*3/uL (ref 0.0–0.1)
Basophils Relative: 1 %
Eosinophils Absolute: 0.3 10*3/uL (ref 0.0–0.5)
Eosinophils Relative: 4 %
HCT: 28.6 % — ABNORMAL LOW (ref 39.0–52.0)
Hemoglobin: 8.5 g/dL — ABNORMAL LOW (ref 13.0–17.0)
Immature Granulocytes: 0 %
Lymphocytes Relative: 28 %
Lymphs Abs: 2.4 10*3/uL (ref 0.7–4.0)
MCH: 28.3 pg (ref 26.0–34.0)
MCHC: 29.7 g/dL — ABNORMAL LOW (ref 30.0–36.0)
MCV: 95.3 fL (ref 80.0–100.0)
Monocytes Absolute: 0.5 10*3/uL (ref 0.1–1.0)
Monocytes Relative: 6 %
Neutro Abs: 5.3 10*3/uL (ref 1.7–7.7)
Neutrophils Relative %: 61 %
Platelets: 352 10*3/uL (ref 150–400)
RBC: 3 MIL/uL — ABNORMAL LOW (ref 4.22–5.81)
RDW: 16.9 % — ABNORMAL HIGH (ref 11.5–15.5)
WBC: 8.6 10*3/uL (ref 4.0–10.5)
nRBC: 0 % (ref 0.0–0.2)

## 2022-12-29 LAB — CULTURE, BLOOD (ROUTINE X 2): Special Requests: ADEQUATE

## 2022-12-29 LAB — COMPREHENSIVE METABOLIC PANEL
ALT: 16 U/L (ref 0–44)
AST: 16 U/L (ref 15–41)
Albumin: 2.6 g/dL — ABNORMAL LOW (ref 3.5–5.0)
Alkaline Phosphatase: 88 U/L (ref 38–126)
Anion gap: 3 — ABNORMAL LOW (ref 5–15)
BUN: 15 mg/dL (ref 6–20)
CO2: 29 mmol/L (ref 22–32)
Calcium: 8.9 mg/dL (ref 8.9–10.3)
Chloride: 101 mmol/L (ref 98–111)
Creatinine, Ser: 0.9 mg/dL (ref 0.61–1.24)
GFR, Estimated: >60 mL/min (ref >60)
Glucose, Bld: 93 mg/dL (ref 70–99)
Potassium: 3.4 mmol/L — ABNORMAL LOW (ref 3.5–5.1)
Sodium: 133 mmol/L — ABNORMAL LOW (ref 135–145)
Total Bilirubin: 0.3 mg/dL (ref 0.3–1.2)
Total Protein: 7.8 g/dL (ref 6.5–8.1)

## 2022-12-29 LAB — PROTIME-INR
INR: 1.2 (ref 0.8–1.2)
Prothrombin Time: 14.7 seconds (ref 11.4–15.2)

## 2022-12-29 NOTE — Discharge Instructions (Addendum)
Labs and imaging look good, no evidence of injury from fall.

## 2022-12-29 NOTE — ED Notes (Signed)
Pt was advised that urine sample is needed.  Pt is on primo fit

## 2022-12-29 NOTE — ED Triage Notes (Signed)
Pt arrives by [facility] from [facility] due to fall out of bed.  Pt had a unwitnessed fall out of bed and struck his head.  No LOC.  Pt is not on any blood thinners.  EMS states that pt was half off bed with head on ground and legs on bed when they got there.  Pt is alert and oriented but can not recall falling out of bed (possibly fell out in his sleep?).

## 2022-12-29 NOTE — ED Provider Notes (Signed)
[facility] Provider Note   CSN: [id number] Arrival date & time: 12/29/22  0542     History  Chief Complaint  Patient presents with   Lytle Michaels    [name] is a 37 y.o. male.  [name] is a 37 y.o. male with a history of HIV, hypertension, migraines, seizures, alcohol abuse and cirrhosis, who presents from [facility] facility after concern for an unwitnessed fall.  Patient was found in his room with his legs still in the bed but his head on the floor.  Patient does not have use of his legs but cannot remember why.  Patient does not recall falling but does report that he was asleep.  He reports some mild pain to his head neck and low back as well as his left hip.  Patient is not on blood thinners.  Patient also reports some shortness of breath but denies chest pain or abdominal pain.  No known fevers.  No other aggravating or alleviating factors.   Fall Associated symptoms include headaches and shortness of breath. Pertinent negatives include no chest pain and no abdominal pain.       Home Medications Prior to Admission medications   Medication Sig Start Date End Date Taking? Authorizing Provider  acetaminophen (TYLENOL) 500 MG tablet Take 1,000 mg by mouth every 6 (six) hours as needed for moderate pain or mild pain.    [provider]  albuterol (VENTOLIN HFA) 108 (90 Base) MCG/ACT inhaler Inhale 1 puff into the lungs every 6 (six) hours as needed for wheezing or shortness of breath. 08/30/22 11/25/22  Amin, Jeanella Flattery, MD  ALPRAZolam Duanne Moron) 0.5 MG tablet Take 1 tablet (0.5 mg total) by mouth 3 (three) times daily as needed for anxiety. 12/15/22   Manuella Ghazi, Pratik D, DO  bictegravir-emtricitabine-tenofovir AF (BIKTARVY) 50-200-25 MG TABS tablet Take 1 tablet by mouth daily. 08/30/22   Amin, Jeanella Flattery, MD  cyclobenzaprine (FLEXERIL) 5 MG tablet Take 1 tablet (5 mg total) by mouth 3 (three) times  daily as needed for muscle spasms. 12/15/22   Manuella Ghazi, Pratik D, DO  diphenoxylate-atropine (LOMOTIL) 2.5-0.025 MG tablet Take 1 tablet by mouth daily. 11/18/22   [provider]  escitalopram (LEXAPRO) 20 MG tablet Take 1 tablet (20 mg total) by mouth daily. 08/30/22   Amin, Jeanella Flattery, MD  famotidine (PEPCID) 40 MG tablet Take 40 mg by mouth every morning. 11/18/22   [provider]  feeding supplement (ENSURE ENLIVE / ENSURE PLUS) LIQD Take 237 mLs by mouth 2 (two) times daily between meals. 03/04/22   Florencia Reasons, MD  ferrous sulfate 325 (65 FE) MG tablet Take 1 tablet (325 mg total) by mouth daily with breakfast. 12/16/22 01/15/23  Manuella Ghazi, Pratik D, DO  folic acid (FOLVITE) 1 MG tablet Take 1 tablet (1 mg total) by mouth daily. Patient not taking: Reported on 12/06/2022 08/30/22   Damita Lack, MD  furosemide (LASIX) 40 MG tablet Take 40 mg by mouth daily as needed. 11/18/22   [provider]  gabapentin (NEURONTIN) 100 MG capsule Take 1 capsule (100 mg total) by mouth 2 (two) times daily. 12/15/22 01/14/23  Manuella Ghazi, Pratik D, DO  HYDROcodone-acetaminophen (NORCO/VICODIN) 5-325 MG tablet Take 1 tablet by mouth every 6 (six) hours as needed for moderate pain. 12/15/22   Manuella Ghazi, Pratik D, DO  ibuprofen (ADVIL) 200 MG tablet Take 600 mg by mouth every 6 (six) hours as needed for mild pain  or moderate pain.    [provider]  lip balm (CARMEX) ointment Apply topically as needed for lip care. 07/07/22   Allie Bossier, MD  Lurasidone HCl 120 MG TABS Take 1 tablet (120 mg total) by mouth daily. 08/30/22   Amin, Jeanella Flattery, MD  midodrine (PROAMATINE) 5 MG tablet Take 3 tablets (15 mg total) by mouth 3 (three) times daily with meals. 12/15/22 01/14/23  Heath Lark D, DO  Multiple Vitamin (MULTIVITAMIN WITH MINERALS) TABS tablet Take 1 tablet by mouth daily. 09/01/21   Raiford Noble Latif, DO  nystatin (MYCOSTATIN/NYSTOP) powder Apply topically 2 (two) times daily. Patient not taking:  Reported on 12/06/2022 07/07/22   Allie Bossier, MD  ondansetron (ZOFRAN) 8 MG tablet Take 8 mg by mouth every 8 (eight) hours as needed. 11/18/22   [provider]  pantoprazole (PROTONIX) 40 MG tablet Take 1 tablet (40 mg total) by mouth daily. Patient not taking: Reported on 12/06/2022 08/30/22   Damita Lack, MD  Polyethyl Glycol-Propyl Glycol (SYSTANE OP) Place 1-2 drops into both eyes daily as needed (Dry eyes). Patient not taking: Reported on 12/06/2022    [provider]  polyethylene glycol (MIRALAX / GLYCOLAX) 17 g packet Take 17 g by mouth daily as needed for mild constipation. 12/15/22   Manuella Ghazi, Pratik D, DO  senna-docusate (SENOKOT-S) 8.6-50 MG tablet Take 1 tablet by mouth at bedtime as needed for moderate constipation. 08/30/22   Amin, Ankit Chirag, MD  temazepam (RESTORIL) 30 MG capsule Take 1 capsule (30 mg total) by mouth at bedtime. 12/15/22   Manuella Ghazi, Pratik D, DO  thiamine (VITAMIN B1) 100 MG tablet Take 1 tablet (100 mg total) by mouth daily. 08/30/22   Amin, Jeanella Flattery, MD  traZODone (DESYREL) 100 MG tablet Take 1 tablet (100 mg total) by mouth at bedtime. 12/15/22   Manuella Ghazi, Pratik D, DO  trolamine salicylate (ASPERCREME) 10 % cream Apply 1 Application topically as needed for muscle pain.    [provider]  valACYclovir (VALTREX) 1000 MG tablet Take 1 tablet (1,000 mg total) by mouth daily. 08/30/22   Amin, Jeanella Flattery, MD      Allergies    Aczone [dapsone], Pollen extract, and Primaquine phosphate    Review of Systems   Review of Systems  Constitutional:  Negative for chills and fever.  Eyes:  Negative for visual disturbance.  Respiratory:  Positive for shortness of breath.   Cardiovascular:  Negative for chest pain, palpitations and leg swelling.  Gastrointestinal:  Negative for abdominal pain, nausea and vomiting.  Musculoskeletal:  Positive for arthralgias, back pain and neck pain.  Skin:  Negative for color change and wound.  Neurological:   Positive for headaches.  All other systems reviewed and are negative.   Physical Exam Updated Vital Signs BP 103/80   Pulse 99   Temp 98.5 F (36.9 C) (Oral)   Resp 18   SpO2 100%  Physical Exam Vitals and nursing note reviewed.  Constitutional:      General: He is not in acute distress.    Appearance: Normal appearance. He is well-developed. He is not diaphoretic.     Comments: Alert chronically ill appearing, but in no acute distress  HENT:     Head: Normocephalic.     Comments: Mild tenderness over the crown of the head with small palpable hematoma, no bony step-off,  neg battle sign    Right Ear: Ear canal normal.     Left Ear: Ear canal  normal.     Mouth/Throat:     Mouth: Mucous membranes are moist.     Pharynx: Oropharynx is clear.  Eyes:     General:        Right eye: No discharge.        Left eye: No discharge.     Extraocular Movements: Extraocular movements intact.     Pupils: Pupils are equal, round, and reactive to light.  Neck:     Comments: Midline C spine tenderness, without palpable step off or deformity, normal ROM Cardiovascular:     Rate and Rhythm: Normal rate and regular rhythm.     Pulses: Normal pulses.     Heart sounds: Normal heart sounds.  Pulmonary:     Effort: Pulmonary effort is normal. No respiratory distress.     Breath sounds: Rhonchi present. No wheezing or rales.     Comments: Respirations equal and unlabored, patient able to speak in full sentences, lungs with faint scattered rhonchi, but otherwise clear with good air movement Chest:     Chest wall: No tenderness.  Abdominal:     General: Bowel sounds are normal. There is no distension.     Palpations: Abdomen is soft. There is no mass.     Tenderness: There is no abdominal tenderness. There is no guarding.     Comments: Abdomen soft, nondistended, nontender to palpation in all quadrants without guarding or peritoneal signs  Musculoskeletal:        General: No deformity.      Cervical back: Neck supple. Tenderness present.     Comments: Mild lumbar spine tenderness without deformity or step off, pt reports tenderness with palpation over the left hip but no deformity or overlying skin changes, all other joints supple and easily moveable, all compartments soft  Skin:    General: Skin is warm and dry.     Capillary Refill: Capillary refill takes less than 2 seconds.  Neurological:     Mental Status: He is alert and oriented to person, place, and time.     Coordination: Coordination normal.     Comments: Speech is clear, able to follow commands CN III-XII intact Normal strength in upper extremities bilaterally, pt with chronic paralysis of lower extremities   Psychiatric:        Mood and Affect: Mood normal.        Behavior: Behavior normal.     ED Results / Procedures / Treatments   Labs (all labs ordered are listed, but only abnormal results are displayed) Labs Reviewed  CBC WITH DIFFERENTIAL/PLATELET - Abnormal; Notable for the following components:      Result Value   RBC 3.00 (*)    Hemoglobin 8.5 (*)    HCT 28.6 (*)    MCHC 29.7 (*)    RDW 16.9 (*)    All other components within normal limits  URINALYSIS, ROUTINE W REFLEX MICROSCOPIC - Abnormal; Notable for the following components:   Color, Urine STRAW (*)    Leukocytes,Ua MODERATE (*)    Bacteria, UA RARE (*)    All other components within normal limits  COMPREHENSIVE METABOLIC PANEL - Abnormal; Notable for the following components:   Sodium 133 (*)    Potassium 3.4 (*)    Albumin 2.6 (*)    Anion gap 3 (*)    All other components within normal limits  PROTIME-INR    EKG EKG Interpretation  Date/Time:  Thursday December 29 2022 08:03:10 EDT Ventricular Rate:  98 PR Interval:  144 QRS Duration: 77 QT Interval:  366 QTC Calculation: 468 R Axis:   66 Text Interpretation: Sinus rhythm Consider left atrial enlargement ry Confirmed by Lennice Sites ([id number]) on 12/29/2022 8:08:11  AM  Radiology CT Head Wo Contrast  Result Date: 12/29/2022 CLINICAL DATA:  Provided history: Head trauma, moderate/severe. Neck trauma, impaired range of motion. Fall. EXAM: CT HEAD WITHOUT CONTRAST CT CERVICAL SPINE WITHOUT CONTRAST TECHNIQUE: Multidetector CT imaging of the head and cervical spine was performed following the standard protocol without intravenous contrast. Multiplanar CT image reconstructions of the cervical spine were also generated. RADIATION DOSE REDUCTION: This exam was performed according to the departmental dose-optimization program which includes automated exposure control, adjustment of the mA and/or kV according to patient size and/or use of iterative reconstruction technique. COMPARISON:  Head CT 08/26/2022.  Cervical spine CT 08/23/2021. FINDINGS: CT HEAD FINDINGS Brain: No age advanced or lobar predominant parenchymal atrophy. Symmetric mineralization within the bilateral basal ganglia. There is no acute intracranial hemorrhage. No demarcated cortical infarct. No extra-axial fluid collection. No evidence of an intracranial mass. No midline shift. Vascular: No hyperdense vessel. Atherosclerotic calcifications. Skull: No fracture or aggressive osseous lesion. Sinuses/Orbits: No mass or acute finding within the imaged orbits. No significant paranasal sinus disease at the imaged levels. Redemonstrated chronic fracture deformity of the right inferior orbital rim and anterior wall the right maxillary sinus. CT CERVICAL SPINE FINDINGS Alignment: Straightening of the expected cervical lordosis. No significant spondylolisthesis. Skull base and vertebrae: The basion-dental and atlanto-dental intervals are maintained.No evidence of acute fracture to the cervical spine. Congenital nonunion of the posterior arch of C1. Soft tissues and spinal canal: No prevertebral fluid or swelling. No visible canal hematoma. Disc levels: Cervical spondylosis with multilevel disc space narrowing and disc  bulges. No appreciable high-grade spinal canal stenosis. Uncovertebral hypertrophy results in right-sided neural foraminal narrowing at C5-C6. C5-C6 ventral osteophyte. Upper chest: No consolidation within the imaged lung apices. No visible pneumothorax. IMPRESSION: CT head: No evidence of an acute intracranial abnormality. CT cervical spine: 1. No evidence of acute fracture to the cervical spine. 2. Nonspecific straightening of the expected cervical lordosis. 3. Cervical spondylosis. Electronically Signed   By: Kellie Simmering D.O.   On: 12/29/2022 08:17   CT Cervical Spine Wo Contrast  Result Date: 12/29/2022 CLINICAL DATA:  Provided history: Head trauma, moderate/severe. Neck trauma, impaired range of motion. Fall. EXAM: CT HEAD WITHOUT CONTRAST CT CERVICAL SPINE WITHOUT CONTRAST TECHNIQUE: Multidetector CT imaging of the head and cervical spine was performed following the standard protocol without intravenous contrast. Multiplanar CT image reconstructions of the cervical spine were also generated. RADIATION DOSE REDUCTION: This exam was performed according to the departmental dose-optimization program which includes automated exposure control, adjustment of the mA and/or kV according to patient size and/or use of iterative reconstruction technique. COMPARISON:  Head CT 08/26/2022.  Cervical spine CT 08/23/2021. FINDINGS: CT HEAD FINDINGS Brain: No age advanced or lobar predominant parenchymal atrophy. Symmetric mineralization within the bilateral basal ganglia. There is no acute intracranial hemorrhage. No demarcated cortical infarct. No extra-axial fluid collection. No evidence of an intracranial mass. No midline shift. Vascular: No hyperdense vessel. Atherosclerotic calcifications. Skull: No fracture or aggressive osseous lesion. Sinuses/Orbits: No mass or acute finding within the imaged orbits. No significant paranasal sinus disease at the imaged levels. Redemonstrated chronic fracture deformity of the right  inferior orbital rim and anterior wall the right maxillary sinus. CT CERVICAL SPINE FINDINGS Alignment: Straightening of the expected cervical lordosis. No significant spondylolisthesis.  Skull base and vertebrae: The basion-dental and atlanto-dental intervals are maintained.No evidence of acute fracture to the cervical spine. Congenital nonunion of the posterior arch of C1. Soft tissues and spinal canal: No prevertebral fluid or swelling. No visible canal hematoma. Disc levels: Cervical spondylosis with multilevel disc space narrowing and disc bulges. No appreciable high-grade spinal canal stenosis. Uncovertebral hypertrophy results in right-sided neural foraminal narrowing at C5-C6. C5-C6 ventral osteophyte. Upper chest: No consolidation within the imaged lung apices. No visible pneumothorax. IMPRESSION: CT head: No evidence of an acute intracranial abnormality. CT cervical spine: 1. No evidence of acute fracture to the cervical spine. 2. Nonspecific straightening of the expected cervical lordosis. 3. Cervical spondylosis. Electronically Signed   By: Kellie Simmering D.O.   On: 12/29/2022 08:17   DG Hip Unilat W or Wo Pelvis 2-3 Views Left  Result Date: 12/29/2022 CLINICAL DATA:  Fall with pain across lower back and left hip. EXAM: DG HIP (WITH OR WITHOUT PELVIS) 2-3V LEFT COMPARISON:  Hip radiographs 05/08/2021 FINDINGS: Evaluation is suboptimal due to patient positioning. There is flattening and deformity of the left femoral head consistent with avascular necrosis the deformity appears progressed since 2022 but is not significantly changed compared to the recent CT abdomen/pelvis. There is no definite superimposed acute fracture. There is no acute dislocation. The right femoral head is normal in appearance. Femoroacetabular alignment is normal on the right. The SI joints and symphysis pubis are intact. IMPRESSION: 1. 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 2. No definite evidence of superimposed acute fracture. Electronically Signed   By: Valetta Mole M.D.   On: 12/29/2022 08:16   DG Lumbar Spine Complete  Result Date: 12/29/2022 CLINICAL DATA:  Fall with pain across lower back and left hip. EXAM: LUMBAR SPINE - COMPLETE 4+ VIEW COMPARISON:  CT abdomen/pelvis 12/06/2022 FINDINGS: There are 5 non-rib-bearing lumbar-type vertebral bodies. Vertebral body heights are preserved, without definite evidence of acute fracture. Alignment is normal. The disc spaces are preserved. There is no significant degenerative change. There is no spondylolysis. IMPRESSION: No evidence of acute injury in the lumbar spine. Electronically Signed   By: Valetta Mole M.D.   On: 12/29/2022 08:13   DG Chest 1 View  Result Date: 12/29/2022 CLINICAL DATA:  Provided history: Fall. EXAM: CHEST  1 VIEW COMPARISON:  Prior chest radiograph 12/06/2022 and earlier. FINDINGS: Heart size within normal limits. No appreciable airspace consolidation. No evidence of pleural effusion or pneumothorax. No acute osseous abnormality identified. IMPRESSION: No evidence of acute cardiopulmonary abnormality. Electronically Signed   By: Kellie Simmering D.O.   On: 12/29/2022 08:04    Procedures Procedures    Medications Ordered in ED Medications - No data to display  ED Course/ Medical Decision Making/ A&P                             Medical Decision Making Amount and/or Complexity of Data Reviewed Labs: ordered. Radiology: ordered.   37 y.o. male presents to the ED with complaints of unwitnessed fall, this involves an extensive number of treatment options, and is a complaint that carries with it a high risk of complications and morbidity.  The differential diagnosis includes head injury, skull fracture, intracranial bleeding, C-spine fracture, hip fracture, back injury.  Patient mentions occasional shortness of breath although this appears to be chronic.   Patient satting well on room air.  On arrival pt is nontoxic, vitals WNL. Exam without signs of trauma.  Patient is chronically ill-appearing with contractures, unable to use his lower extremities  Additional history obtained from chart review, skilled nursing facility. Previous records obtained and reviewed   Patient's home medications from SNF reviewed  Lab Tests:  I Ordered, reviewed, and interpreted labs, which included: No leukocytosis, stable chronic anemia with hemoglobin of 8.5, unchanged from recent hospitalization, no significant electrolyte derangements, normal renal and liver function, INR normal despite underlying liver dysfunction, UA without signs of infection.  Imaging Studies ordered:  I ordered imaging studies which included CT of the head and cervical spine as well as x-rays of the chest, left hip and lumbar spine, I independently visualized and interpreted imaging which showed no evidence of intracranial traumatic injury or bleeding, no skull fracture or C-spine fracture.  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  Fortunately no evidence of traumatic injury from his fall.  Patient expresses anxiety about returning to skilled nursing facility.  I discussed with the patient that at this time there is not indication for hospital admission.  He and his family member should discuss if he would like to attempt to find a different facility that is a process that can happen outside of the hospital.  He expresses understanding.  Patient will be transported by PTR back to skilled nursing facility   Portions of this note were generated with Dragon dictation software. Dictation errors may occur despite best attempts at proofreading.         Final Clinical Impression(s) / ED Diagnoses Final diagnoses:  Fall, initial  encounter    Rx / DC Orders ED Discharge Orders     None         Janet Berlin 01/05/23 0836    Lennice Sites, DO 01/07/23 [phone]

## 2022-12-29 NOTE — ED Notes (Signed)
Pt is asking for breakfast, fortunately no distress noted at this time.  Call bell given and pt is watching television.

## 2022-12-29 NOTE — ED Notes (Signed)
RN contacted [facility] and called [facility] and gave report to nurse Johann Capers

## 2022-12-29 NOTE — ED Notes (Signed)
Call to pt mother [place] and advised her that pt is here.  Mother clarifies that pt does NOT have a legal guardian.

## 2022-12-29 NOTE — ED Notes (Signed)
Pt was taken to radiology.

## 2023-01-01 ENCOUNTER — Emergency Department (HOSPITAL_COMMUNITY)
Admission: EM | Admit: 2023-01-01 | Discharge: 2023-01-01 | Disposition: A | Discharge status: Home / Self Care | Payer: Medicare HMO | Attending: Emergency Medicine | Admitting: Emergency Medicine

## 2023-01-01 DIAGNOSIS — Z79899 Other long term (current) drug therapy: Secondary | ICD-10-CM | POA: Insufficient documentation

## 2023-01-01 DIAGNOSIS — Z21 Asymptomatic human immunodeficiency virus [HIV] infection status: Secondary | ICD-10-CM | POA: Diagnosis not present

## 2023-01-01 DIAGNOSIS — M545 Low back pain, unspecified: Secondary | ICD-10-CM | POA: Diagnosis present

## 2023-01-01 DIAGNOSIS — Z593 Problems related to living in residential institution: Secondary | ICD-10-CM | POA: Insufficient documentation

## 2023-01-01 DIAGNOSIS — I10 Essential (primary) hypertension: Secondary | ICD-10-CM | POA: Insufficient documentation

## 2023-01-01 NOTE — Discharge Instructions (Addendum)
Thank you for allowing me to be a part of your care today.  Please follow-up with your primary care provider as needed.

## 2023-01-01 NOTE — ED Notes (Signed)
Gave pt ham sandwich and a cola

## 2023-01-01 NOTE — ED Notes (Addendum)
Pt. Soiled brief with urine. Pt. Cleaned up with soap and water on washcloth. Pt. Placed in new gown and new brief applied with 2 assist.

## 2023-01-01 NOTE — ED Provider Notes (Signed)
[facility] Provider Note   CSN: [id number] Arrival date & time: 01/01/23  1727     History  Chief Complaint  Patient presents with   [name] is a 37 y.o. male with past medical history as outlined below presents to the ED via EMS stating that he fell out of bed yesterday morning and his lower back is hurting.  According to facility staff, patient has not fallen from bed yesterday or today and that patient was upset with CNA and other staff, was not allowing anyone to touch him.  Patient contacted 911 with his personal cell phone to be transported out of the facility.  EMS reports that when they arrived, patient was in bed covered in urine and feces and patient had to be convinced to be cleaned up prior to being loaded onto stretcher for transport.  Patient states he does not recall any of this as he has memory loss.  Denies taking blood thinners, nausea, vomiting, diarrhea, loss of consciousness.    Past Medical History:  Diagnosis Date   Bipolar 1 disorder ([place])    Depression    Dizziness and giddiness 02/01/2016   Failure to thrive in adult    Herpes genitalia    HIV disease ([place])    Hypertension    Hyponatremia    Hypothermia 08/24/2021   Migraine headache 02/01/2016   Peripheral neuropathy 10/01/2019   PTSD (post-traumatic stress disorder)    Schizoaffective disorder ([place])    Seizures ([place])         Home Medications Prior to Admission medications   Medication Sig Start Date End Date Taking? Authorizing Provider  acetaminophen (TYLENOL) 500 MG tablet Take 1,000 mg by mouth every 6 (six) hours as needed for moderate pain or mild pain.    [provider]  albuterol (VENTOLIN HFA) 108 (90 Base) MCG/ACT inhaler Inhale 1 puff into the lungs every 6 (six) hours as needed for wheezing or shortness of breath. 08/30/22 11/25/22  Amin, Jeanella Flattery, MD  ALPRAZolam Duanne Moron) 0.5 MG tablet Take 1 tablet (0.5 mg  total) by mouth 3 (three) times daily as needed for anxiety. 12/15/22   Manuella Ghazi, Pratik D, DO  bictegravir-emtricitabine-tenofovir AF (BIKTARVY) 50-200-25 MG TABS tablet Take 1 tablet by mouth daily. 08/30/22   Amin, Jeanella Flattery, MD  cyclobenzaprine (FLEXERIL) 5 MG tablet Take 1 tablet (5 mg total) by mouth 3 (three) times daily as needed for muscle spasms. 12/15/22   Manuella Ghazi, Pratik D, DO  diphenoxylate-atropine (LOMOTIL) 2.5-0.025 MG tablet Take 1 tablet by mouth daily. 11/18/22   [provider]  escitalopram (LEXAPRO) 20 MG tablet Take 1 tablet (20 mg total) by mouth daily. 08/30/22   Amin, Jeanella Flattery, MD  famotidine (PEPCID) 40 MG tablet Take 40 mg by mouth every morning. 11/18/22   [provider]  feeding supplement (ENSURE ENLIVE / ENSURE PLUS) LIQD Take 237 mLs by mouth 2 (two) times daily between meals. 03/04/22   Florencia Reasons, MD  ferrous sulfate 325 (65 FE) MG tablet Take 1 tablet (325 mg total) by mouth daily with breakfast. 12/16/22 01/15/23  Manuella Ghazi, Pratik D, DO  folic acid (FOLVITE) 1 MG tablet Take 1 tablet (1 mg total) by mouth daily. Patient not taking: Reported on 12/06/2022 08/30/22   Damita Lack, MD  furosemide (LASIX) 40 MG tablet Take 40 mg by mouth daily as needed. 11/18/22   [provider]  gabapentin (NEURONTIN) 100 MG  capsule Take 1 capsule (100 mg total) by mouth 2 (two) times daily. 12/15/22 01/14/23  Manuella Ghazi, Pratik D, DO  HYDROcodone-acetaminophen (NORCO/VICODIN) 5-325 MG tablet Take 1 tablet by mouth every 6 (six) hours as needed for moderate pain. 12/15/22   Manuella Ghazi, Pratik D, DO  ibuprofen (ADVIL) 200 MG tablet Take 600 mg by mouth every 6 (six) hours as needed for mild pain or moderate pain.    [provider]  lip balm (CARMEX) ointment Apply topically as needed for lip care. 07/07/22   Allie Bossier, MD  Lurasidone HCl 120 MG TABS Take 1 tablet (120 mg total) by mouth daily. 08/30/22   Amin, Jeanella Flattery, MD  midodrine (PROAMATINE) 5 MG tablet Take 3  tablets (15 mg total) by mouth 3 (three) times daily with meals. 12/15/22 01/14/23  Heath Lark D, DO  Multiple Vitamin (MULTIVITAMIN WITH MINERALS) TABS tablet Take 1 tablet by mouth daily. 09/01/21   Raiford Noble Latif, DO  nystatin (MYCOSTATIN/NYSTOP) powder Apply topically 2 (two) times daily. Patient not taking: Reported on 12/06/2022 07/07/22   Allie Bossier, MD  ondansetron (ZOFRAN) 8 MG tablet Take 8 mg by mouth every 8 (eight) hours as needed. 11/18/22   [provider]  pantoprazole (PROTONIX) 40 MG tablet Take 1 tablet (40 mg total) by mouth daily. Patient not taking: Reported on 12/06/2022 08/30/22   Damita Lack, MD  Polyethyl Glycol-Propyl Glycol (SYSTANE OP) Place 1-2 drops into both eyes daily as needed (Dry eyes). Patient not taking: Reported on 12/06/2022    [provider]  polyethylene glycol (MIRALAX / GLYCOLAX) 17 g packet Take 17 g by mouth daily as needed for mild constipation. 12/15/22   Manuella Ghazi, Pratik D, DO  senna-docusate (SENOKOT-S) 8.6-50 MG tablet Take 1 tablet by mouth at bedtime as needed for moderate constipation. 08/30/22   Amin, Ankit Chirag, MD  temazepam (RESTORIL) 30 MG capsule Take 1 capsule (30 mg total) by mouth at bedtime. 12/15/22   Manuella Ghazi, Pratik D, DO  thiamine (VITAMIN B1) 100 MG tablet Take 1 tablet (100 mg total) by mouth daily. 08/30/22   Amin, Jeanella Flattery, MD  traZODone (DESYREL) 100 MG tablet Take 1 tablet (100 mg total) by mouth at bedtime. 12/15/22   Manuella Ghazi, Pratik D, DO  trolamine salicylate (ASPERCREME) 10 % cream Apply 1 Application topically as needed for muscle pain.    [provider]  valACYclovir (VALTREX) 1000 MG tablet Take 1 tablet (1,000 mg total) by mouth daily. 08/30/22   Amin, Jeanella Flattery, MD      Allergies    Aczone [dapsone], Pollen extract, and Primaquine phosphate    Review of Systems   Review of Systems  Gastrointestinal:  Negative for diarrhea, nausea and vomiting.  Musculoskeletal:  Positive for back  pain. Negative for neck pain.  Neurological:  Negative for syncope.    Physical Exam Updated Vital Signs BP 108/73 (BP Location: Left Arm)   Pulse 94   Temp 98.2 F (36.8 C) (Oral)   Resp 14   SpO2 99%  Physical Exam Vitals and nursing note reviewed.  Constitutional:      General: He is not in acute distress.    Appearance: Normal appearance. He is ill-appearing (chronic). He is not diaphoretic.  HENT:     Head: Normocephalic and atraumatic.  Cardiovascular:     Rate and Rhythm: Normal rate and regular rhythm.     Pulses: Normal pulses.  Pulmonary:     Effort: Pulmonary effort is normal.  Musculoskeletal:     Cervical back: No tenderness or bony tenderness. No pain with movement, spinous process tenderness or muscular tenderness.     Thoracic back: No deformity, tenderness or bony tenderness.     Lumbar back: No deformity, tenderness or bony tenderness.     Comments: Patient with upper and lower extremity contractures.  No gross deformity, contusions, abrasions, hematomas, or other signs of injury to back.   Skin:    General: Skin is warm and dry.     Capillary Refill: Capillary refill takes less than 2 seconds.  Neurological:     Mental Status: He is alert. Mental status is at baseline.  Psychiatric:        Mood and Affect: Mood normal.        Behavior: Behavior normal.     ED Results / Procedures / Treatments   Labs (all labs ordered are listed, but only abnormal results are displayed) Labs Reviewed - No data to display  EKG None  Radiology No results found.  Procedures Procedures    Medications Ordered in ED Medications - No data to display  ED Course/ Medical Decision Making/ A&P                             Medical Decision Making  This patient presents to the ED with chief complaint(s) of fall with pertinent past medical history of hypertension, HIV, failure to thrive, schizoaffective disorder, seizures, prior alcohol dependence.  The complaint  involves an extensive differential diagnosis and also carries with it a high risk of complications and morbidity.    The differential diagnosis includes musculoskeletal sprain or strain, contusion, behavioral disturbance   The initial plan is to talk to care facility staff and patient's mother  Additional history obtained: Additional history obtained from EMS  -EMS reports that when they arrived to patient's bedside at the facility, he was "covered in urine and feces" and was refusing to allow staff to clean him up.  EMS was able to convince the patient to have him cleaned up prior to loading him onto the stretcher.  They did not see any evidence of a fall that occurred today, patient told EMS he fell earlier today.  Additional history also obtained from skilled nursing facility where patient resides.  Staff state that patient did not fall from bed and that he was upset with CNA and other staff so he contacted 911 with his personal cell phone to be transported out of the facility as he is not happy there.  Patient has severe contractures and is bedbound, and if he was to fall out of bed he would not be able to get himself back in it.  Staff states that they have not picked him up off of the floor yesterday or today.  RN spoke to patient's mother.  Mother requested patient to go back to [facility] where he was residing.  She also does not believe that patient fell.  Patient tells his mother that he is not happy at [facility] and wants to leave there and be placed somewhere else.  Per mom, patient was rejected from multiple facilities before being accepted at [facility].  [name] requesting to be informed when patient is discharged and going back to facility.  Records reviewed  patient was seen a few days ago in the ED for a fall.  Patient was found in his room with his legs in bed but  his head on the floor.  Patient was evaluated for low back pain at this visit.  Initial  Assessment:   Exam significant for a chronically ill-appearing patient with contractures of upper and lower extremities.  There are no obvious gross deformities, contusions, abrasions, hematomas, or other signs of injury on exam.  Spine without gross deformity or tenderness to palpation.  Patient states during initial assessment that he feels that he "needs an overnight stay" because he does not wish to go back to the facility where he is staying because he feels that they are not taking good care of him, feeding him, or giving his medications to him on time.  Discussed with patient that we are unable to admit for facility placement changes and he expressed his understanding of this.  Independent visualization and interpretation of imaging: I independently visualized the following imaging with scope of interpretation limited to determining acute life threatening conditions related to emergency care: I do not feel that imaging is required at this time.  Patient had imaging done on 12/29/2022 after a fall out of bed and facility staff are stating patient did not fall at this time.  Disposition:   Patient is appropriate for discharge back to skilled nursing facility.  I suspect that patient is not happy with the care he is receiving at his facility, therefore, requested transport to the ED so he could inquire about being placed at a different living facility.  The patient has been appropriately medically screened and/or stabilized in the ED. I have low suspicion for any other emergent medical condition which would require further screening, evaluation or treatment in the ED or require inpatient management. At time of discharge the patient is hemodynamically stable and in no acute distress. I have discussed work-up results and diagnosis with patient and answered all questions. Patient is agreeable with discharge plan. We discussed strict return precautions for returning to the emergency department and they  verbalized understanding.           Final Clinical Impression(s) / ED Diagnoses Final diagnoses:  Problems related to living in residential institution    Rx / DC Orders ED Discharge Orders     None         Pat Kocher, [place] 01/01/23 2359    Lacretia Leigh, MD 01/03/23 1746

## 2023-01-01 NOTE — ED Notes (Addendum)
Pt is requesting to talk with a Education officer, museum for new placement. Pt prefers not to return to [facility]

## 2023-01-01 NOTE — ED Notes (Addendum)
Pt called out to say his hand is cramping RN and student made aware

## 2023-01-01 NOTE — ED Notes (Addendum)
Pt called out to say he feels like pins and needles and something doesn't feel right Rn and student made aware

## 2023-01-01 NOTE — ED Notes (Signed)
Contacted [facility] for pt transport.

## 2023-01-01 NOTE — ED Notes (Signed)
PT. Given ice for finger pain.

## 2023-01-01 NOTE — ED Notes (Addendum)
Gave pt cola with ice

## 2023-01-01 NOTE — ED Notes (Signed)
Pt called out with c\o of lower back pain Rn and student made aware

## 2023-01-01 NOTE — ED Notes (Signed)
Spoke to pt's mom. She is requesting for the pt to go back to [facility]. Mom does not believe pt fell. Pt is not happy at [facility] and wants to leave there and be placed somewhere else. Pt's mom would like to know when the pt is d/c and going back home to [facility].

## 2023-01-01 NOTE — ED Triage Notes (Signed)
Pt BIB [facility] with c/o of fall out of bed that happened yesterday morning. According to staff, pt did not fall from bed and pt was upset with other staff so he contacted 911 with his personal cellphone to be transported out of the facility. Denies taking blood thinners, no N/V/D. No LOC. Has wounds on both sides of the hips but has a wound nurse that helps him. A&Ox4  BP palp 104 RR 14 HR 94 O2 97% RA T 99 CBG 122

## 2023-01-14 ENCOUNTER — Encounter (HOSPITAL_COMMUNITY): Payer: Self-pay | Admitting: Emergency Medicine

## 2023-01-14 ENCOUNTER — Other Ambulatory Visit: Payer: Self-pay

## 2023-01-14 ENCOUNTER — Emergency Department (HOSPITAL_COMMUNITY): Payer: Medicare HMO

## 2023-01-14 ENCOUNTER — Emergency Department (HOSPITAL_COMMUNITY)
Admission: EM | Admit: 2023-01-14 | Discharge: 2023-01-14 | Disposition: A | Discharge status: Skilled Nursing Facility | Payer: Medicare HMO | Attending: Emergency Medicine | Admitting: Emergency Medicine

## 2023-01-14 DIAGNOSIS — R0602 Shortness of breath: Secondary | ICD-10-CM | POA: Insufficient documentation

## 2023-01-14 DIAGNOSIS — R079 Chest pain, unspecified: Secondary | ICD-10-CM | POA: Diagnosis not present

## 2023-01-14 DIAGNOSIS — Z21 Asymptomatic human immunodeficiency virus [HIV] infection status: Secondary | ICD-10-CM | POA: Insufficient documentation

## 2023-01-14 DIAGNOSIS — F17210 Nicotine dependence, cigarettes, uncomplicated: Secondary | ICD-10-CM | POA: Insufficient documentation

## 2023-01-14 DIAGNOSIS — Z79899 Other long term (current) drug therapy: Secondary | ICD-10-CM | POA: Insufficient documentation

## 2023-01-14 DIAGNOSIS — R000 Tachycardia, unspecified: Secondary | ICD-10-CM | POA: Diagnosis not present

## 2023-01-14 DIAGNOSIS — I10 Essential (primary) hypertension: Secondary | ICD-10-CM | POA: Insufficient documentation

## 2023-01-14 DIAGNOSIS — M25569 Pain in unspecified knee: Secondary | ICD-10-CM | POA: Diagnosis not present

## 2023-01-14 LAB — CBC WITH DIFFERENTIAL/PLATELET
Abs Immature Granulocytes: 0.09 10*3/uL — ABNORMAL HIGH (ref 0.00–0.07)
Basophils Absolute: 0.1 10*3/uL (ref 0.0–0.1)
Basophils Relative: 1 %
Eosinophils Absolute: 0.3 10*3/uL (ref 0.0–0.5)
Eosinophils Relative: 3 %
HCT: 30 % — ABNORMAL LOW (ref 39.0–52.0)
Hemoglobin: 9.1 g/dL — ABNORMAL LOW (ref 13.0–17.0)
Immature Granulocytes: 1 %
Lymphocytes Relative: 30 %
Lymphs Abs: 2.6 10*3/uL (ref 0.7–4.0)
MCH: 28 pg (ref 26.0–34.0)
MCHC: 30.3 g/dL (ref 30.0–36.0)
MCV: 92.3 fL (ref 80.0–100.0)
Monocytes Absolute: 0.5 10*3/uL (ref 0.1–1.0)
Monocytes Relative: 6 %
Neutro Abs: 5.2 10*3/uL (ref 1.7–7.7)
Neutrophils Relative %: 59 %
Platelets: 482 10*3/uL — ABNORMAL HIGH (ref 150–400)
RBC: 3.25 MIL/uL — ABNORMAL LOW (ref 4.22–5.81)
RDW: 16.5 % — ABNORMAL HIGH (ref 11.5–15.5)
WBC: 8.7 10*3/uL (ref 4.0–10.5)
nRBC: 0 % (ref 0.0–0.2)

## 2023-01-14 LAB — COMPREHENSIVE METABOLIC PANEL
ALT: 23 U/L (ref 0–44)
AST: 19 U/L (ref 15–41)
Albumin: 2.9 g/dL — ABNORMAL LOW (ref 3.5–5.0)
Alkaline Phosphatase: 126 U/L (ref 38–126)
Anion gap: 8 (ref 5–15)
BUN: 19 mg/dL (ref 6–20)
CO2: 27 mmol/L (ref 22–32)
Calcium: 9.8 mg/dL (ref 8.9–10.3)
Chloride: 101 mmol/L (ref 98–111)
Creatinine, Ser: 0.93 mg/dL (ref 0.61–1.24)
GFR, Estimated: >60 mL/min (ref >60)
Glucose, Bld: 99 mg/dL (ref 70–99)
Potassium: 3.6 mmol/L (ref 3.5–5.1)
Sodium: 136 mmol/L (ref 135–145)
Total Bilirubin: 0.5 mg/dL (ref 0.3–1.2)
Total Protein: 8.4 g/dL — ABNORMAL HIGH (ref 6.5–8.1)

## 2023-01-14 LAB — TROPONIN I (HIGH SENSITIVITY): Troponin I (High Sensitivity): 3 ng/L (ref <18)

## 2023-01-14 LAB — D-DIMER, QUANTITATIVE: D-Dimer, Quant: 3.03 ug/mL-FEU — ABNORMAL HIGH (ref 0.00–0.50)

## 2023-01-14 LAB — BRAIN NATRIURETIC PEPTIDE: B Natriuretic Peptide: 33.1 pg/mL (ref 0.0–100.0)

## 2023-01-14 MED ORDER — IOHEXOL 350 MG/ML SOLN
75.0000 mL | Freq: Once | INTRAVENOUS | Status: AC | PRN
  Administered 2023-01-14: 75 mL via INTRAVENOUS

## 2023-01-14 MED ORDER — SODIUM CHLORIDE (PF) 0.9 % IJ SOLN
INTRAMUSCULAR | Status: AC
  Filled 2023-01-14: qty 50

## 2023-01-14 MED ORDER — SODIUM CHLORIDE 0.9 % IV BOLUS: 1000.0000 mL | Freq: Once | INTRAVENOUS | Status: DC

## 2023-01-14 MED ORDER — SODIUM CHLORIDE 0.9 % IV BOLUS
1000.0000 mL | Freq: Once | INTRAVENOUS | Status: AC
  Administered 2023-01-14: 1000 mL via INTRAVENOUS

## 2023-01-14 NOTE — Discharge Instructions (Signed)
Please discuss with social worker at your facility and with your primary care provider Dr Concepcion Elk in regards to placement to a different facility

## 2023-01-14 NOTE — ED Triage Notes (Signed)
[facility] in [facility] via [facility] with reported onset of chest pain and bilateral knee pain when he woke up this morning. Denies any n/v or sob

## 2023-01-14 NOTE — ED Provider Notes (Signed)
[place] EMERGENCY DEPARTMENT AT [facility] Provider Note  CSN: [id number] Arrival date & time: 01/14/23 0456  Chief Complaint(s) Chest Pain and Knee Pain  HPI [name] is a 37 y.o. male with past medical history as below, significant for bipolar 1 disorder, HIV, PTSD, schizoaffective, FTT, etoh abuse who presents to the ED with complaint of myriad of complaints but primarily since been concerned about the quality of his current nursing facility.  He reports chest pain this evening that has since resolved after arriving at the ER where the air was "clean and fresh."  No palpitations or dyspnea.  No abdominal pain, nausea or vomiting.  No change in bowel or bladder function.  No fevers or chills, no rashes.  He is concerned that his current living facility is dirty and the staff is rude and does not given his medications on time and that the food is not very good.  He is requesting something to eat and to be placed into a new assisted living facility is cleaner and has better food  Past Medical History Past Medical History:  Diagnosis Date   Bipolar 1 disorder    Depression    Dizziness and giddiness 02/01/2016   Failure to thrive in adult    Herpes genitalia    HIV disease    Hypertension    Hyponatremia    Hypothermia 08/24/2021   Migraine headache 02/01/2016   Peripheral neuropathy 10/01/2019   PTSD (post-traumatic stress disorder)    Schizoaffective disorder    Seizures    Patient Active Problem List   Diagnosis Date Noted   UTI (urinary tract infection) 12/06/2022   Pressure injury of skin 08/28/2022   AMS (altered mental status) 08/26/2022   AKI (acute kidney injury) 06/24/2022   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   Noncompliance with medications 02/20/2022   Hyperphosphatemia 02/17/2022   Alcoholic cirrhosis of liver 02/17/2022   Chronic alcohol use 02/17/2022   Bipolar disorder 02/17/2022   Sepsis 02/13/2022   Malnutrition of moderate degree  10/20/2021   Palliative care by specialist    DNR (do not resuscitate) 10/14/2021   Aspiration pneumonia 10/14/2021   FTT (failure to thrive) in adult 10/14/2021   Dysphagia 10/14/2021   Coagulopathy 10/14/2021   Protein-calorie malnutrition, severe 10/09/2021    Class: Chronic   Avascular necrosis of femoral head, left 10/07/2021   Alcoholic cirrhosis of liver without ascites 10/07/2021   Cellulitis of multiple sites of buttock 10/07/2021   Hypotension    Hypoxia 08/24/2021   Left hip pain    Transaminitis    Acute metabolic encephalopathy 05/07/2021   Cellulitis 05/07/2021   Lymphadenopathy 09/29/2020   Anemia    Upper urinary tract infection    Sepsis secondary to UTI 03/14/2020   Cellulitis of groin 03/14/2020   HIV disease    Macrocytic anemia    Thrombocytopenia    Prolonged QT interval    Acute respiratory failure due to COVID-19 10/27/2019   GERD (gastroesophageal reflux disease) 10/27/2019   Hypokalemia 10/27/2019   Peripheral neuropathy 10/01/2019   PTSD (post-traumatic stress disorder) 07/23/2018   Dizziness and giddiness 02/01/2016   Migraine headache 02/01/2016   Schizoaffective disorder, depressive type (HCC) 05/13/2015   Severe alcohol dependence (HCC) 05/13/2015   Suicidal ideation 01/12/2014   Home Medication(s) Prior to Admission medications   Medication Sig Start Date End Date Taking? Authorizing Provider  acetaminophen (TYLENOL) 500 MG tablet Take 1,000 mg by mouth every 6 (six) hours as needed for moderate  pain or mild pain.    [provider]  albuterol (VENTOLIN HFA) 108 (90 Base) MCG/ACT inhaler Inhale 1 puff into the lungs every 6 (six) hours as needed for wheezing or shortness of breath. 08/30/22 11/25/22  Amin, Loura Halt, MD  ALPRAZolam Prudy Feeler) 0.5 MG tablet Take 1 tablet (0.5 mg total) by mouth 3 (three) times daily as needed for anxiety. 12/15/22   Sherryll Burger, Pratik D, DO  bictegravir-emtricitabine-tenofovir AF (BIKTARVY) 50-200-25 MG TABS  tablet Take 1 tablet by mouth daily. 08/30/22   Amin, Loura Halt, MD  cyclobenzaprine (FLEXERIL) 5 MG tablet Take 1 tablet (5 mg total) by mouth 3 (three) times daily as needed for muscle spasms. 12/15/22   Sherryll Burger, Pratik D, DO  diphenoxylate-atropine (LOMOTIL) 2.5-0.025 MG tablet Take 1 tablet by mouth daily. 11/18/22   [provider]  escitalopram (LEXAPRO) 20 MG tablet Take 1 tablet (20 mg total) by mouth daily. 08/30/22   Amin, Loura Halt, MD  famotidine (PEPCID) 40 MG tablet Take 40 mg by mouth every morning. 11/18/22   [provider]  feeding supplement (ENSURE ENLIVE / ENSURE PLUS) LIQD Take 237 mLs by mouth 2 (two) times daily between meals. 03/04/22   Albertine Grates, MD  ferrous sulfate 325 (65 FE) MG tablet Take 1 tablet (325 mg total) by mouth daily with breakfast. 12/16/22 01/15/23  Sherryll Burger, Pratik D, DO  folic acid (FOLVITE) 1 MG tablet Take 1 tablet (1 mg total) by mouth daily. Patient not taking: Reported on 12/06/2022 08/30/22   Dimple Nanas, MD  furosemide (LASIX) 40 MG tablet Take 40 mg by mouth daily as needed. 11/18/22   [provider]  gabapentin (NEURONTIN) 100 MG capsule Take 1 capsule (100 mg total) by mouth 2 (two) times daily. 12/15/22 01/14/23  Sherryll Burger, Pratik D, DO  HYDROcodone-acetaminophen (NORCO/VICODIN) 5-325 MG tablet Take 1 tablet by mouth every 6 (six) hours as needed for moderate pain. 12/15/22   Sherryll Burger, Pratik D, DO  ibuprofen (ADVIL) 200 MG tablet Take 600 mg by mouth every 6 (six) hours as needed for mild pain or moderate pain.    [provider]  lip balm (CARMEX) ointment Apply topically as needed for lip care. 07/07/22   Drema Dallas, MD  Lurasidone HCl 120 MG TABS Take 1 tablet (120 mg total) by mouth daily. 08/30/22   Amin, Loura Halt, MD  midodrine (PROAMATINE) 5 MG tablet Take 3 tablets (15 mg total) by mouth 3 (three) times daily with meals. 12/15/22 01/14/23  Maurilio Lovely D, DO  Multiple Vitamin (MULTIVITAMIN WITH MINERALS) TABS tablet Take  1 tablet by mouth daily. 09/01/21   Marguerita Merles Latif, DO  nystatin (MYCOSTATIN/NYSTOP) powder Apply topically 2 (two) times daily. Patient not taking: Reported on 12/06/2022 07/07/22   Drema Dallas, MD  ondansetron (ZOFRAN) 8 MG tablet Take 8 mg by mouth every 8 (eight) hours as needed. 11/18/22   [provider]  pantoprazole (PROTONIX) 40 MG tablet Take 1 tablet (40 mg total) by mouth daily. Patient not taking: Reported on 12/06/2022 08/30/22   Dimple Nanas, MD  Polyethyl Glycol-Propyl Glycol (SYSTANE OP) Place 1-2 drops into both eyes daily as needed (Dry eyes). Patient not taking: Reported on 12/06/2022    [provider]  polyethylene glycol (MIRALAX / GLYCOLAX) 17 g packet Take 17 g by mouth daily as needed for mild constipation. 12/15/22   Sherryll Burger, Pratik D, DO  senna-docusate (SENOKOT-S) 8.6-50 MG tablet Take 1 tablet by mouth at bedtime as  needed for moderate constipation. 08/30/22   Amin, Ankit Chirag, MD  temazepam (RESTORIL) 30 MG capsule Take 1 capsule (30 mg total) by mouth at bedtime. 12/15/22   Sherryll Burger, Pratik D, DO  thiamine (VITAMIN B1) 100 MG tablet Take 1 tablet (100 mg total) by mouth daily. 08/30/22   Amin, Loura Halt, MD  traZODone (DESYREL) 100 MG tablet Take 1 tablet (100 mg total) by mouth at bedtime. 12/15/22   Sherryll Burger, Pratik D, DO  trolamine salicylate (ASPERCREME) 10 % cream Apply 1 Application topically as needed for muscle pain.    [provider]  valACYclovir (VALTREX) 1000 MG tablet Take 1 tablet (1,000 mg total) by mouth daily. 08/30/22   Dimple Nanas, MD                                                                                                                                    Past Surgical History Past Surgical History:  Procedure Laterality Date   BACK SURGERY     BIOPSY  02/26/2021   Procedure: BIOPSY;  Surgeon: Kathi Der, MD;  Location: [facility];  Service: Gastroenterology;;   COLONOSCOPY WITH PROPOFOL [name]  02/26/2021   Procedure: COLONOSCOPY WITH PROPOFOL;  Surgeon: Kathi Der, MD;  Location: [facility];  Service: Gastroenterology;  Laterality: [name];   ESOPHAGOGASTRODUODENOSCOPY (EGD) WITH PROPOFOL [name] 02/26/2021   Procedure: ESOPHAGOGASTRODUODENOSCOPY (EGD) WITH PROPOFOL;  Surgeon: Kathi Der, MD;  Location: [facility];  Service: Gastroenterology;  Laterality: [name];   ESOPHAGOGASTRODUODENOSCOPY (EGD) WITH PROPOFOL [name] 05/18/2021   Procedure: ESOPHAGOGASTRODUODENOSCOPY (EGD) WITH PROPOFOL;  Surgeon: Kathi Der, MD;  Location: [facility];  Service: Gastroenterology;  Laterality: [name];   HAND SURGERY     Family History Family History  Problem Relation Age of Onset   Alcohol abuse Mother    Schizophrenia Father    Depression Father    Alcohol abuse Father    Alcohol abuse Paternal Uncle    Alcohol abuse Paternal Uncle     Social History Social History   Tobacco Use   Smoking status: Every Day    Packs/day: 1    Types: Cigarettes   Smokeless tobacco: Never  Vaping Use   Vaping Use: Never used  Substance Use Topics   Alcohol use: Yes    Alcohol/week: 14.0 standard drinks of alcohol    Types: 14 Cans of beer per week    Comment: 5-6 40oz per day   Drug use: No   Allergies Aczone [dapsone], Pollen extract, and Primaquine phosphate  Review of Systems Review of Systems  Constitutional:  Negative for chills and fever.  HENT:  Negative for facial swelling and trouble swallowing.   Eyes:  Negative for photophobia and visual disturbance.  Respiratory:  Negative for cough and shortness of breath.   Cardiovascular:  Positive for chest pain. Negative for palpitations.  Gastrointestinal:  Negative for abdominal pain, nausea and vomiting.  Endocrine: Negative for polydipsia and polyuria.  Genitourinary:  Negative for difficulty urinating and hematuria.  Musculoskeletal:  Positive for arthralgias. Negative for gait problem and joint swelling.  Skin:  Negative for  pallor and rash.  Neurological:  Negative for syncope and headaches.  Psychiatric/Behavioral:  Negative for agitation and confusion.     Physical Exam Vital Signs  I have reviewed the triage vital signs BP 124/66   Pulse (!) 114   Temp 98.1 F (36.7 C) (Oral)   Resp (!) 22   Wt 67 kg   SpO2 99%   BMI 18.46 kg/m  Physical Exam Vitals and nursing note reviewed.  Constitutional:      General: He is not in acute distress.    Appearance: He is not diaphoretic.     Comments: Cachectic  HENT:     Head: Normocephalic and atraumatic.     Right Ear: External ear normal.     Left Ear: External ear normal.     Mouth/Throat:     Mouth: Mucous membranes are moist.  Eyes:     General: No scleral icterus. Cardiovascular:     Rate and Rhythm: Regular rhythm. Tachycardia present.     Pulses: Normal pulses.     Heart sounds: Normal heart sounds.  Pulmonary:     Effort: Pulmonary effort is normal. No respiratory distress.     Breath sounds: Normal breath sounds.  Abdominal:     General: Abdomen is flat.     Palpations: Abdomen is soft.     Tenderness: There is no abdominal tenderness.  Musculoskeletal:     Right lower leg: No edema.     Left lower leg: No edema.  Skin:    General: Skin is warm and dry.     Capillary Refill: Capillary refill takes less than 2 seconds.  Neurological:     Mental Status: He is alert and oriented to person, place, and time.     GCS: GCS eye subscore is 4. GCS verbal subscore is 5. GCS motor subscore is 6.  Psychiatric:        Mood and Affect: Mood normal.        Behavior: Behavior normal.     ED Results and Treatments Labs (all labs ordered are listed, but only abnormal results are displayed) Labs Reviewed  COMPREHENSIVE METABOLIC PANEL - Abnormal; Notable for the following components:      Result Value   Total Protein 8.4 (*)    Albumin 2.9 (*)    All other components within normal limits  CBC WITH DIFFERENTIAL/PLATELET - Abnormal; Notable  for the following components:   RBC 3.25 (*)    Hemoglobin 9.1 (*)    HCT 30.0 (*)    RDW 16.5 (*)    Platelets 482 (*)    Abs Immature Granulocytes 0.09 (*)    All other components within normal limits  D-DIMER, QUANTITATIVE (NOT AT [facility]) - Abnormal; Notable for the following components:   D-Dimer, Quant 3.03 (*)    All other components within normal limits  BRAIN NATRIURETIC PEPTIDE  TROPONIN I (HIGH SENSITIVITY)  TROPONIN I (HIGH SENSITIVITY)  Radiology DG Chest Port 1 View  Result Date: 01/14/2023 CLINICAL DATA:  37 year old male with chest pain and shortness of breath for 2 days. EXAM: PORTABLE CHEST 1 VIEW COMPARISON:  Portable chest 12/29/2022 and earlier. FINDINGS: Portable AP semi upright view at 0516 hours. Lower lung volumes and rotated to the left today. Mediastinal contours remain within normal limits. No pneumothorax, pulmonary edema, pleural effusion or consolidation. Paucity bowel gas in the upper abdomen. No acute osseous abnormality identified. IMPRESSION: Rotated portable chest with lower lung volumes. No acute cardiopulmonary abnormality identified Electronically Signed   By: Odessa Fleming M.D.   On: 01/14/2023 05:57    Pertinent labs & imaging results that were available during my care of the patient were reviewed by me and considered in my medical decision making (see MDM for details).  Medications Ordered in ED Medications  sodium chloride 0.9 % bolus 1,000 mL (has no administration in time range)  sodium chloride 0.9 % bolus 1,000 mL (1,000 mLs Intravenous New Bag/Given 01/14/23 0547)  iohexol (OMNIPAQUE) 350 MG/ML injection 75 mL (75 mLs Intravenous Contrast Given 01/14/23 4098)                                                                                                                                     Procedures Procedures  (including critical  care time)  Medical Decision Making / ED Course    Medical Decision Making:    JAVARUS DORNER is a 37 y.o. male with past medical history as below, significant for bipolar 1 disorder, HIV, PTSD, schizoaffective, FTT, etoh abuse who presents to the ED with complaint of myriad of complaints but primarily since been concerned about the quality of his current nursing facility.. The complaint involves an extensive differential diagnosis and also carries with it a high risk of complications and morbidity.  Serious etiology was considered. Ddx includes but is not limited to: Differential includes all life-threatening causes for chest pain. This includes but is not exclusive to acute coronary syndrome, aortic dissection, pulmonary embolism, cardiac tamponade, community-acquired pneumonia, pericarditis, musculoskeletal chest wall pain, etc.   Complete initial physical exam performed, notably the patient  was no acute distress, resting comfortably, breathing comfortably on ambient air.    Reviewed and confirmed nursing documentation for past medical history, family history, social history.  Vital signs reviewed.    Clinical Course as of 01/14/23 0708  Sat Jan 14, 2023  0545 WELL's score is low, PERC rule does not apply given tachycardia [SG]  0547 BP: 96/75 He is on scheduled midodrine, has not had morning dose [SG]  0603 Hemoglobin(!): 9.1 Similar to baseline [SG]  0603 Chest x-ray and EKG were stable [SG]  0617 D-Dimer, Quant(!): 3.03 Ct pe ordered, trop not elev, no resp distress, HR improving w/ IVF [SG]    Clinical Course User Index [SG] Sloan Leiter, DO   Patient with initial complaint of chest pain  which is since resolved, he is tachycardic without palpitations.  Currently asymptomatic.  He is bedbound, no evidence of DVT on exam.  Will get D-dimer given tachycardia.  Screening labs for chest pain.  Advised patient that he should discuss with his current care facility his desire to move  to a new care facility ' HR improved after 500 cc fluids, will continue rest of bolus  Pt signed out to incoming EDP pending CTPE and ultimate dispo      Additional history obtained: -Additional history obtained from ems -External records from outside source obtained and reviewed including: Chart review including previous notes, labs, imaging, consultation notes including prior ED visits, prior labs and imaging, home medications Patient was seen 3/24 with similar complaint, at that time he did report a fall and was found covered in urine and feces at facility but was refusing to allow staff to clean/care for him.  Nursing staff at that time denied any fall and patient was in his bed when EMS arrived, he cannot ambulate and is bedbound.  That time he also called EMS from his personal cell phone because he is upset about his current care   Lab Tests: -I ordered, reviewed, and interpreted labs.   The pertinent results include:   Labs Reviewed  COMPREHENSIVE METABOLIC PANEL - Abnormal; Notable for the following components:      Result Value   Total Protein 8.4 (*)    Albumin 2.9 (*)    All other components within normal limits  CBC WITH DIFFERENTIAL/PLATELET - Abnormal; Notable for the following components:   RBC 3.25 (*)    Hemoglobin 9.1 (*)    HCT 30.0 (*)    RDW 16.5 (*)    Platelets 482 (*)    Abs Immature Granulocytes 0.09 (*)    All other components within normal limits  D-DIMER, QUANTITATIVE (NOT AT [facility]) - Abnormal; Notable for the following components:   D-Dimer, Quant 3.03 (*)    All other components within normal limits  BRAIN NATRIURETIC PEPTIDE  TROPONIN I (HIGH SENSITIVITY)  TROPONIN I (HIGH SENSITIVITY)    Notable for dimer elev, labs stable o/w  EKG   EKG Interpretation  Date/Time:  Saturday January 14 2023 05:18:26 EDT Ventricular Rate:  112 PR Interval:  139 QRS Duration: 72 QT Interval:  333 QTC Calculation: 455 R Axis:   51 Text Interpretation: Sinus  tachycardia Probable left atrial enlargement ST elev, probable normal early repol pattern Confirmed by Tanda Rockers ([id number]) on 01/14/2023 5:47:16 AM         Imaging Studies ordered: I ordered imaging studies including CXR CTPE I independently visualized the following imaging with scope of interpretation limited to determining acute life threatening conditions related to emergency care; findings noted above, significant for CXR wnl, CTPE pending but no large pe on wet read  I independently visualized and interpreted imaging. I agree with the radiologist interpretation   Medicines ordered and prescription drug management: Meds ordered this encounter  Medications   sodium chloride 0.9 % bolus 1,000 mL   iohexol (OMNIPAQUE) 350 MG/ML injection 75 mL   sodium chloride 0.9 % bolus 1,000 mL    -I have reviewed the patients home medicines and have made adjustments as needed   Consultations Obtained: na   Cardiac Monitoring: The patient was maintained on a cardiac monitor.  I personally viewed and interpreted the cardiac monitored which showed an underlying rhythm of: sinus tachy  Social Determinants of Health:  Diagnosis or treatment significantly  limited by social determinants of health: tobacco use   Reevaluation: After the interventions noted above, I reevaluated the patient and found that they have improved  Co morbidities that complicate the patient evaluation  Past Medical History:  Diagnosis Date   Bipolar 1 disorder    Depression    Dizziness and giddiness 02/01/2016   Failure to thrive in adult    Herpes genitalia    HIV disease    Hypertension    Hyponatremia    Hypothermia 08/24/2021   Migraine headache 02/01/2016   Peripheral neuropathy 10/01/2019   PTSD (post-traumatic stress disorder)    Schizoaffective disorder    Seizures       Dispostion: Disposition decision including need for hospitalization was considered, and patient disposition pending at time of  sign out.    Final Clinical Impression(s) / ED Diagnoses Final diagnoses:  Chest pain, unspecified type     This chart was dictated using voice recognition software.  Despite best efforts to proofread,  errors can occur which can change the documentation meaning.    Sloan LeiterGray, Evian Derringer A, DO 01/14/23 [phone]

## 2023-02-24 ENCOUNTER — Emergency Department (HOSPITAL_COMMUNITY): Payer: Medicare HMO

## 2023-02-24 ENCOUNTER — Other Ambulatory Visit: Payer: Self-pay

## 2023-02-24 ENCOUNTER — Encounter (HOSPITAL_COMMUNITY): Payer: Self-pay

## 2023-02-24 ENCOUNTER — Emergency Department (HOSPITAL_COMMUNITY)
Admission: EM | Admit: 2023-02-24 | Discharge: 2023-02-25 | Disposition: A | Discharge status: Skilled Nursing Facility | Payer: Medicare HMO | Attending: Emergency Medicine | Admitting: Emergency Medicine

## 2023-02-24 DIAGNOSIS — I10 Essential (primary) hypertension: Secondary | ICD-10-CM | POA: Insufficient documentation

## 2023-02-24 DIAGNOSIS — Z5189 Encounter for other specified aftercare: Secondary | ICD-10-CM

## 2023-02-24 DIAGNOSIS — L89150 Pressure ulcer of sacral region, unstageable: Secondary | ICD-10-CM | POA: Insufficient documentation

## 2023-02-24 DIAGNOSIS — Z21 Asymptomatic human immunodeficiency virus [HIV] infection status: Secondary | ICD-10-CM | POA: Diagnosis not present

## 2023-02-24 DIAGNOSIS — Z4800 Encounter for change or removal of nonsurgical wound dressing: Secondary | ICD-10-CM | POA: Insufficient documentation

## 2023-02-24 LAB — COMPREHENSIVE METABOLIC PANEL
ALT: 49 U/L — ABNORMAL HIGH (ref 0–44)
AST: 44 U/L — ABNORMAL HIGH (ref 15–41)
Albumin: 3 g/dL — ABNORMAL LOW (ref 3.5–5.0)
Alkaline Phosphatase: 243 U/L — ABNORMAL HIGH (ref 38–126)
Anion gap: 11 (ref 5–15)
BUN: 31 mg/dL — ABNORMAL HIGH (ref 6–20)
CO2: 25 mmol/L (ref 22–32)
Calcium: 9.8 mg/dL (ref 8.9–10.3)
Chloride: 98 mmol/L (ref 98–111)
Creatinine, Ser: 1.22 mg/dL (ref 0.61–1.24)
GFR, Estimated: >60 mL/min (ref >60)
Glucose, Bld: 122 mg/dL — ABNORMAL HIGH (ref 70–99)
Potassium: 3.8 mmol/L (ref 3.5–5.1)
Sodium: 134 mmol/L — ABNORMAL LOW (ref 135–145)
Total Bilirubin: 0.3 mg/dL (ref 0.3–1.2)
Total Protein: 10.2 g/dL — ABNORMAL HIGH (ref 6.5–8.1)

## 2023-02-24 LAB — URINALYSIS, ROUTINE W REFLEX MICROSCOPIC
Bacteria, UA: NONE SEEN
Bilirubin Urine: NEGATIVE
Glucose, UA: NEGATIVE mg/dL
Hgb urine dipstick: NEGATIVE
Ketones, ur: NEGATIVE mg/dL
Nitrite: NEGATIVE
Protein, ur: NEGATIVE mg/dL
Specific Gravity, Urine: 1.017 (ref 1.005–1.030)
pH: 6 (ref 5.0–8.0)

## 2023-02-24 LAB — CBC WITH DIFFERENTIAL/PLATELET
Abs Immature Granulocytes: 0.08 10*3/uL — ABNORMAL HIGH (ref 0.00–0.07)
Basophils Absolute: 0.1 10*3/uL (ref 0.0–0.1)
Basophils Relative: 1 %
Eosinophils Absolute: 0.3 10*3/uL (ref 0.0–0.5)
Eosinophils Relative: 3 %
HCT: 30.5 % — ABNORMAL LOW (ref 39.0–52.0)
Hemoglobin: 9.3 g/dL — ABNORMAL LOW (ref 13.0–17.0)
Immature Granulocytes: 1 %
Lymphocytes Relative: 22 %
Lymphs Abs: 2.3 10*3/uL (ref 0.7–4.0)
MCH: 27.8 pg (ref 26.0–34.0)
MCHC: 30.5 g/dL (ref 30.0–36.0)
MCV: 91.3 fL (ref 80.0–100.0)
Monocytes Absolute: 0.8 10*3/uL (ref 0.1–1.0)
Monocytes Relative: 8 %
Neutro Abs: 7 10*3/uL (ref 1.7–7.7)
Neutrophils Relative %: 65 %
Platelets: 415 10*3/uL — ABNORMAL HIGH (ref 150–400)
RBC: 3.34 MIL/uL — ABNORMAL LOW (ref 4.22–5.81)
RDW: 15.2 % (ref 11.5–15.5)
WBC: 10.6 10*3/uL — ABNORMAL HIGH (ref 4.0–10.5)
nRBC: 0 % (ref 0.0–0.2)

## 2023-02-24 LAB — LACTIC ACID, PLASMA: Lactic Acid, Venous: 1.5 mmol/L (ref 0.5–1.9)

## 2023-02-24 MED ORDER — FENTANYL CITRATE PF 50 MCG/ML IJ SOSY
50.0000 ug | PREFILLED_SYRINGE | Freq: Once | INTRAMUSCULAR | Status: AC
  Administered 2023-02-24: 50 ug via INTRAVENOUS
  Filled 2023-02-24: qty 1

## 2023-02-24 MED ORDER — SODIUM CHLORIDE 0.9 % IV BOLUS
1000.0000 mL | Freq: Once | INTRAVENOUS | Status: AC
  Administered 2023-02-24: 1000 mL via INTRAVENOUS

## 2023-02-24 MED ORDER — ONDANSETRON 4 MG PO TBDP: 4.0000 mg | ORAL_TABLET | Freq: Once | ORAL | Status: AC

## 2023-02-24 MED ORDER — HYDROMORPHONE HCL 1 MG/ML IJ SOLN: 1.0000 mg | Freq: Once | INTRAMUSCULAR | Status: AC

## 2023-02-24 MED ORDER — HYDROMORPHONE HCL 1 MG/ML IJ SOLN
1.0000 mg | Freq: Once | INTRAMUSCULAR | Status: AC
  Administered 2023-02-24: 1 mg via INTRAVENOUS
  Filled 2023-02-24: qty 1

## 2023-02-24 MED ORDER — ONDANSETRON HCL 4 MG/2ML IJ SOLN
4.0000 mg | Freq: Once | INTRAMUSCULAR | Status: AC
  Administered 2023-02-24: 4 mg via INTRAVENOUS
  Filled 2023-02-24: qty 2

## 2023-02-24 NOTE — ED Notes (Addendum)
Pt states he is unable to give urine sample at this time. Pt refusing straight cath for urine sample. Pt also requesting blood work be collected after pain medication.

## 2023-02-24 NOTE — ED Notes (Signed)
Pt refusing xrays. Requesting pain medication before xray. PA notified.

## 2023-02-24 NOTE — Discharge Instructions (Signed)
Follow up with pyschiatry to get your meds adjusted, Speak the the case manager at your SNF if you DO not like the place you are living at and want to be placed elsewhere. Come to the er for emergencies only please

## 2023-02-24 NOTE — ED Notes (Signed)
PA aware of pt's Hr 120's-130's. New orders placed.

## 2023-02-24 NOTE — ED Triage Notes (Addendum)
BIBA with c/o refusing care at facility with worsening wounds to "sacrum" per patient. Ems reports was in urine and stool all day due to refusing care.  Pt states "I just want to be discharged from [facility]"

## 2023-02-24 NOTE — ED Notes (Signed)
[facility] called  

## 2023-02-24 NOTE — ED Provider Notes (Signed)
[facility] Provider Note   CSN: [id number] Arrival date & time: 02/24/23  1647     History {Add pertinent medical, surgical, social history, OB history to HPI:1} No chief complaint on file.   [name] is a 37 y.o. malebedbound from SNF with significant history of HIV,  , HTN, FTT, Chronic hidradenitis suppurativa involving groin creases, pubic region, buttocks , Anemia of chronic disease, alcoholic liver cirrhosis, GERD and history of orthostatic hypotension in the setting of autonomic dysfunction on chronic decubitus ulcers in the setting of hidradenitis suppurativa. He presents right hip and wound pain, complaint of memory issues.  History is gathered by the patient and his by EMS.  They report the patient does not like his living facility and has been refusing care of his chronic decubitus wounds by nursing staff.  EMS reports that the staff thinks he is "having a mental health crisis."  Patient reports he is only been having urinary issues but would like to see psychiatrist to have his meds adjusted.  HPI     Home Medications Prior to Admission medications   Medication Sig Start Date End Date Taking? Authorizing Provider  acetaminophen (TYLENOL) 500 MG tablet Take 1,000 mg by mouth every 6 (six) hours as needed for moderate pain or mild pain.    [provider]  albuterol (VENTOLIN HFA) 108 (90 Base) MCG/ACT inhaler Inhale 1 puff into the lungs every 6 (six) hours as needed for wheezing or shortness of breath. 08/30/22 11/25/22  Amin, Loura Halt, MD  ALPRAZolam Prudy Feeler) 0.5 MG tablet Take 1 tablet (0.5 mg total) by mouth 3 (three) times daily as needed for anxiety. 12/15/22   Sherryll Burger, Pratik D, DO  bictegravir-emtricitabine-tenofovir AF (BIKTARVY) 50-200-25 MG TABS tablet Take 1 tablet by mouth daily. 08/30/22   Amin, Loura Halt, MD  cyclobenzaprine (FLEXERIL) 5 MG tablet Take 1 tablet (5 mg total) by mouth 3 (three) times  daily as needed for muscle spasms. 12/15/22   Sherryll Burger, Pratik D, DO  diphenoxylate-atropine (LOMOTIL) 2.5-0.025 MG tablet Take 1 tablet by mouth daily. 11/18/22   [provider]  escitalopram (LEXAPRO) 20 MG tablet Take 1 tablet (20 mg total) by mouth daily. 08/30/22   Amin, Loura Halt, MD  famotidine (PEPCID) 40 MG tablet Take 40 mg by mouth every morning. 11/18/22   [provider]  feeding supplement (ENSURE ENLIVE / ENSURE PLUS) LIQD Take 237 mLs by mouth 2 (two) times daily between meals. 03/04/22   Albertine Grates, MD  ferrous sulfate 325 (65 FE) MG tablet Take 1 tablet (325 mg total) by mouth daily with breakfast. 12/16/22 01/15/23  Sherryll Burger, Pratik D, DO  folic acid (FOLVITE) 1 MG tablet Take 1 tablet (1 mg total) by mouth daily. Patient not taking: Reported on 12/06/2022 08/30/22   Dimple Nanas, MD  furosemide (LASIX) 40 MG tablet Take 40 mg by mouth daily as needed. 11/18/22   [provider]  gabapentin (NEURONTIN) 100 MG capsule Take 1 capsule (100 mg total) by mouth 2 (two) times daily. 12/15/22 01/14/23  Sherryll Burger, Pratik D, DO  HYDROcodone-acetaminophen (NORCO/VICODIN) 5-325 MG tablet Take 1 tablet by mouth every 6 (six) hours as needed for moderate pain. 12/15/22   Sherryll Burger, Pratik D, DO  ibuprofen (ADVIL) 200 MG tablet Take 600 mg by mouth every 6 (six) hours as needed for mild pain or moderate pain.    [provider]  lip balm (CARMEX) ointment Apply topically as needed for  lip care. 07/07/22   Drema Dallas, MD  Lurasidone HCl 120 MG TABS Take 1 tablet (120 mg total) by mouth daily. 08/30/22   Amin, Loura Halt, MD  Multiple Vitamin (MULTIVITAMIN WITH MINERALS) TABS tablet Take 1 tablet by mouth daily. 09/01/21   Marguerita Merles Latif, DO  nystatin (MYCOSTATIN/NYSTOP) powder Apply topically 2 (two) times daily. Patient not taking: Reported on 12/06/2022 07/07/22   Drema Dallas, MD  ondansetron (ZOFRAN) 8 MG tablet Take 8 mg by mouth every 8 (eight) hours as needed. 11/18/22    [provider]  pantoprazole (PROTONIX) 40 MG tablet Take 1 tablet (40 mg total) by mouth daily. Patient not taking: Reported on 12/06/2022 08/30/22   Dimple Nanas, MD  Polyethyl Glycol-Propyl Glycol (SYSTANE OP) Place 1-2 drops into both eyes daily as needed (Dry eyes). Patient not taking: Reported on 12/06/2022    [provider]  polyethylene glycol (MIRALAX / GLYCOLAX) 17 g packet Take 17 g by mouth daily as needed for mild constipation. 12/15/22   Sherryll Burger, Pratik D, DO  senna-docusate (SENOKOT-S) 8.6-50 MG tablet Take 1 tablet by mouth at bedtime as needed for moderate constipation. 08/30/22   Amin, Ankit Chirag, MD  temazepam (RESTORIL) 30 MG capsule Take 1 capsule (30 mg total) by mouth at bedtime. 12/15/22   Sherryll Burger, Pratik D, DO  thiamine (VITAMIN B1) 100 MG tablet Take 1 tablet (100 mg total) by mouth daily. 08/30/22   Amin, Loura Halt, MD  traZODone (DESYREL) 100 MG tablet Take 1 tablet (100 mg total) by mouth at bedtime. 12/15/22   Sherryll Burger, Pratik D, DO  trolamine salicylate (ASPERCREME) 10 % cream Apply 1 Application topically as needed for muscle pain.    [provider]  valACYclovir (VALTREX) 1000 MG tablet Take 1 tablet (1,000 mg total) by mouth daily. 08/30/22   Amin, Loura Halt, MD      Allergies    Aczone [dapsone], Pollen extract, and Primaquine phosphate    Review of Systems   Review of Systems  Physical Exam Updated Vital Signs There were no vitals taken for this visit. Physical Exam Vitals and nursing note reviewed.  Constitutional:      General: He is not in acute distress.    Appearance: He is well-developed. He is not diaphoretic.  HENT:     Head: Normocephalic and atraumatic.  Eyes:     General: No scleral icterus.    Conjunctiva/sclera: Conjunctivae normal.  Cardiovascular:     Rate and Rhythm: Normal rate and regular rhythm.     Heart sounds: Normal heart sounds.  Pulmonary:     Effort: Pulmonary effort is normal. No respiratory  distress.     Breath sounds: Normal breath sounds.  Abdominal:     Palpations: Abdomen is soft.     Tenderness: There is no abdominal tenderness.  Musculoskeletal:     Cervical back: Normal range of motion and neck supple.     Comments: Contract and lower extremities  Skin:    General: Skin is warm and dry.     Comments: Multiple wounds in various states of healing. Nothing worse than stage   Neurological:     Mental Status: He is alert.  Psychiatric:        Behavior: Behavior normal.     ED Results / Procedures / Treatments   Labs (all labs ordered are listed, but only abnormal results are displayed) Labs Reviewed - No data to display  EKG None  Radiology No results  found.  Procedures Procedures  {Document cardiac monitor, telemetry assessment procedure when appropriate:1}  Medications Ordered in ED Medications - No data to display  ED Course/ Medical Decision Making/ A&P   {   Click here for ABCD2, HEART and other calculatorsREFRESH Note before signing :1}                          Medical Decision Making Amount and/or Complexity of Data Reviewed Labs: ordered. ECG/medicine tests: ordered.  Risk Prescription drug management.   ***  {Document critical care time when appropriate:1} {Document review of labs and clinical decision tools ie heart score, Chads2Vasc2 etc:1}  {Document your independent review of radiology images, and any outside records:1} {Document your discussion with family members, caretakers, and with consultants:1} {Document social determinants of health affecting pt's care:1} {Document your decision making why or why not admission, treatments were needed:1} Final Clinical Impression(s) / ED Diagnoses Final diagnoses:  None    Rx / DC Orders ED Discharge Orders     None

## 2023-02-25 NOTE — ED Notes (Signed)
Verified okay with PA to discharge with patients heart rate elevated.

## 2023-02-26 LAB — CULTURE, BLOOD (ROUTINE X 2)

## 2023-02-27 ENCOUNTER — Encounter (HOSPITAL_BASED_OUTPATIENT_CLINIC_OR_DEPARTMENT_OTHER): Payer: Medicare HMO | Attending: Internal Medicine | Admitting: Internal Medicine

## 2023-02-27 DIAGNOSIS — L89893 Pressure ulcer of other site, stage 3: Secondary | ICD-10-CM | POA: Insufficient documentation

## 2023-02-27 DIAGNOSIS — E43 Unspecified severe protein-calorie malnutrition: Secondary | ICD-10-CM | POA: Diagnosis not present

## 2023-02-27 DIAGNOSIS — X58XXXA Exposure to other specified factors, initial encounter: Secondary | ICD-10-CM | POA: Insufficient documentation

## 2023-02-27 DIAGNOSIS — S81001A Unspecified open wound, right knee, initial encounter: Secondary | ICD-10-CM | POA: Insufficient documentation

## 2023-02-27 DIAGNOSIS — K7030 Alcoholic cirrhosis of liver without ascites: Secondary | ICD-10-CM | POA: Diagnosis not present

## 2023-02-27 DIAGNOSIS — L89153 Pressure ulcer of sacral region, stage 3: Secondary | ICD-10-CM | POA: Diagnosis not present

## 2023-02-27 DIAGNOSIS — L732 Hidradenitis suppurativa: Secondary | ICD-10-CM | POA: Insufficient documentation

## 2023-02-27 DIAGNOSIS — Z21 Asymptomatic human immunodeficiency virus [HIV] infection status: Secondary | ICD-10-CM | POA: Diagnosis not present

## 2023-02-27 DIAGNOSIS — L89514 Pressure ulcer of right ankle, stage 4: Secondary | ICD-10-CM | POA: Diagnosis not present

## 2023-02-27 DIAGNOSIS — L89220 Pressure ulcer of left hip, unstageable: Secondary | ICD-10-CM | POA: Diagnosis not present

## 2023-02-27 DIAGNOSIS — S91301A Unspecified open wound, right foot, initial encounter: Secondary | ICD-10-CM | POA: Insufficient documentation

## 2023-02-27 DIAGNOSIS — L89890 Pressure ulcer of other site, unstageable: Secondary | ICD-10-CM | POA: Diagnosis not present

## 2023-02-28 LAB — CULTURE, BLOOD (ROUTINE X 2)

## 2023-03-01 LAB — CULTURE, BLOOD (ROUTINE X 2): Culture: NO GROWTH

## 2023-03-03 NOTE — Progress Notes (Signed)
JAHMON, CREDIT ([id number]) [id number] Page 1 of 3 Visit Report for 02/27/2023 Abuse Risk Screen Details Patient Name: Date of Service: [name] NTWA NNE L. 02/27/2023 8:30 A M Medical Record Number: [id number] Patient Account Number: [id number] Date of Birth/Sex: Treating RN: 12-03-1985 (37 y.o. M) Primary Care Lillyauna Jenkinson: [name] Contras Other Clinician: Referring Lucila Klecka: Treating Kalijah Zeiss/Extender: Junious Silk in Treatment: 0 Abuse Risk Screen Items Answer ABUSE RISK SCREEN: Has anyone close to you tried to hurt or harm you recentlyo No Do you feel uncomfortable with anyone in your familyo No Has anyone forced you do things that you didnt want to doo No Electronic Signature(s) Signed: 03/03/2023 11:53:23 AM By: Thayer Dallas Entered By: Thayer Dallas on 02/27/2023 08:57:58 -------------------------------------------------------------------------------- Activities of Daily Living Details Patient Name: Date of Service: [name] NTWA NNE L. 02/27/2023 8:30 A M Medical Record Number: [id number] Patient Account Number: [id number] Date of Birth/Sex: Treating RN: 03/20/1986 (37 y.o. M) Primary Care Kitti Mcclish: [name] Contras Other Clinician: Referring Lunette Tapp: Treating Lenna Hagarty/Extender: Junious Silk in Treatment: 0 Activities of Daily Living Items Answer Activities of Daily Living (Please select one for each item) Drive Automobile Not Able T Medications ake Need Assistance Use T elephone Completely Able Care for Appearance Not Able Use T oilet Not Able Mady Haagensen / Shower Not Able Dress Self Not Able Feed Self Not Able Walk Not Able Get In / Out Bed Need Assistance Housework Not Able Prepare Meals Not Able Handle Money Not Able Shop for Self Not Able Electronic Signature(s) Signed: 03/03/2023 11:53:23 AM By: Thayer Dallas Entered By: Thayer Dallas on 02/27/2023  08:58:41 -------------------------------------------------------------------------------- Education Screening Details Patient Name: Date of Service: [name] [name], [name] 02/27/2023 8:30 A M Medical Record Number: [id number] Patient Account Number: [id number] Date of Birth/Sex: Treating RN: [birth year] (37 y.o. M) Primary Care Waylyn Tenbrink: [name] Contras Other Clinician: Referring Inetta Dicke: Treating Daejon Lich/Extender: Junious Silk in TreatmentTrystyn, Armond Candice Camp ([id number]) [id number] Page 2 of 3 Primary Learner Assessed: Patient Learning Preferences/Education Level/Primary Language Learning Preference: Explanation, Demonstration, Printed Material Highest Education Level: College or Above Preferred Language: Economist Language Barrier: No Translator Needed: No Memory Deficit: No Emotional Barrier: No Cultural/Religious Beliefs Affecting Medical Care: No Physical Barrier Impaired Vision: No Impaired Hearing: No Decreased Hand dexterity: Yes Knowledge/Comprehension Knowledge Level: High Comprehension Level: High Ability to understand written instructions: High Ability to understand verbal instructions: High Motivation Anxiety Level: Calm Cooperation: Cooperative Education Importance: Acknowledges Need Interest in Health Problems: Asks Questions Perception: Coherent Willingness to Engage in Self-Management High Activities: Readiness to Engage in Self-Management High Activities: Electronic Signature(s) Signed: 03/03/2023 11:53:23 AM By: Thayer Dallas Entered By: Thayer Dallas on 02/27/2023 08:59:13 -------------------------------------------------------------------------------- Fall Risk Assessment Details Patient Name: Date of Service: [name], [name] 02/27/2023 8:30 A M Medical Record Number: [id number] Patient Account Number: [id number] Date of Birth/Sex: Treating RN: 05-15-1986 (37 y.o.  M) Primary Care Althia Egolf: [name] Contras Other Clinician: Referring Leny Morozov: Treating Paidyn Mcferran/Extender: Junious Silk in Treatment: 0 Fall Risk Assessment Items Have you had 2 or more falls in the last 12 monthso 0 Yes Have you had any fall that resulted in injury in the last 12 monthso 0 No FALLS RISK SCREEN History of falling - immediate or within 3 months 0 No Secondary diagnosis (Do you have 2 or more medical diagnoseso) 0 No Ambulatory aid None/bed rest/wheelchair/nurse 0 Yes Crutches/cane/walker 0 No Furniture  0 No Intravenous therapy Access/Saline/Heparin Lock 0 No Gait/Transferring Normal/ bed rest/ wheelchair 0 Yes Weak (short steps with or without shuffle, stooped but able to lift head while walking, may seek 0 No support from furniture) Impaired (short steps with shuffle, may have difficulty arising from chair, head down, impaired 0 No balance) Mental Status Oriented to own ability 0 Yes Overestimates or forgets limitations 0 No Risk Level: Low Risk Score: 0 Mcnaught, Dewel L ([id number]) [id number] Page 3 of 3 Electronic Signature(s) -------------------------------------------------------------------------------- Nutrition Risk Screening Details Patient Name: Date of Service: [name] NTWA NNE L. 02/27/2023 8:30 A M Medical Record Number: [id number] Patient Account Number: [id number] Date of Birth/Sex: Treating RN: [birth year] (37 y.o. M) Primary Care Shloma Roggenkamp: [name] Contras Other Clinician: Referring Tula Schryver: Treating Ivelis Norgard/Extender: Junious Silk in Treatment: 0 Height (in): 75 Weight (lbs): Body Mass Index (BMI): Nutrition Risk Screening Items Score Screening NUTRITION RISK SCREEN: I have an illness or condition that made me change the kind and/or amount of food I eat 0 No I eat fewer than two meals per day 0 No I eat few fruits and vegetables, or milk products 0  No I have three or more drinks of beer, liquor or wine almost every day 0 No I have tooth or mouth problems that make it hard for me to eat 0 No I don't always have enough money to buy the food I need 0 No I eat alone most of the time 0 No I take three or more different prescribed or over-the-counter drugs a day 1 Yes Without wanting to, I have lost or gained 10 pounds in the last six months 2 Yes I am not always physically able to shop, cook and/or feed myself 2 Yes Nutrition Protocols Good Risk Protocol Provide education on elevated blood Moderate Risk Protocol 0 sugars and impact on wound healing, as applicable High Risk Proctocol Risk Level: Moderate Risk Score: 5 Electronic Signature(s) Signed: 03/03/2023 11:53:23 AM By: Thayer Dallas Entered By: Thayer Dallas on 02/27/2023 09:00:33

## 2023-03-13 ENCOUNTER — Ambulatory Visit (HOSPITAL_BASED_OUTPATIENT_CLINIC_OR_DEPARTMENT_OTHER): Payer: Medicare HMO | Admitting: Internal Medicine

## 2023-03-20 ENCOUNTER — Encounter (HOSPITAL_BASED_OUTPATIENT_CLINIC_OR_DEPARTMENT_OTHER): Payer: Medicare HMO | Attending: Internal Medicine | Admitting: Internal Medicine

## 2023-04-10 NOTE — Progress Notes (Addendum)
DMARCUS, ALISON ([id number]) [id number] Page 1 of 14 Visit Report for 02/27/2023 Chief Complaint Document Details Patient Name: Date of Service: [name] NTWA NNE L. 02/27/2023 8:30 A M Medical Record Number: [id number] Patient Account Number: [id number] Date of Birth/Sex: Treating RN: 1986/09/19 (37 y.o. Male) Primary Care Provider: Fleet Contras Other Clinician: Referring Provider: Treating Provider/Extender: Junious Silk in Treatment: 0 Information Obtained from: Patient Chief Complaint 02/27/2023; Open wounds to the sacrum, left hip, right knee, right ankle and right foot Electronic Signature(s) Signed: 02/27/2023 2:02:21 PM By: Geralyn Corwin DO Entered By: Geralyn Corwin on 02/27/2023 10:25:58 -------------------------------------------------------------------------------- Debridement Details Patient Name: Date of Service: [name] [name], [name] 02/27/2023 8:30 A M Medical Record Number: [id number] Patient Account Number: [id number] Date of Birth/Sex: Treating RN: Feb 17, 1986 (37 y.o. Male) Shawn Stall Primary Care Provider: Fleet Contras Other Clinician: Referring Provider: Treating Provider/Extender: Junious Silk in Treatment: 0 Debridement Performed for Assessment: Wound #2 Left Trochanter Performed By: Clinician Shawn Stall, RN Debridement Type: Chemical/Enzymatic/Mechanical Agent Used: Santyl Level of Consciousness (Pre-procedure): Awake and Alert Pre-procedure Verification/Time Out No Taken: Pain Control: Lidocaine 4% Topical Solution Percent of Wound Bed Debrided: Bleeding: Minimum Response to Treatment: Procedure was tolerated well Level of Consciousness (Post- Awake and Alert procedure): Post Debridement Measurements of Total Wound Length: (cm) 6.5 Stage: Unstageable/Unclassified Width: (cm) 5.9 Depth: (cm) 0.2 Volume: (cm) 6.024 Character of Wound/Ulcer Post Debridement:  Requires Further Debridement Post Procedure Diagnosis Same as Pre-procedure Electronic Signature(s) Signed: 02/27/2023 2:02:21 PM By: Geralyn Corwin DO Signed: 02/27/2023 6:19:31 PM By: Shawn Stall RN, BSN Entered By: Shawn Stall on 02/27/2023 10:03:09 Mcgaughey, Candice Camp ([id number]) [id number] Page 2 of 14 -------------------------------------------------------------------------------- Debridement Details Patient Name: Date of Service: [name] NTWA NNE L. 02/27/2023 8:30 A M Medical Record Number: [id number] Patient Account Number: [id number] Date of Birth/Sex: Treating RN: September 06, 1986 (37 y.o. Male) Shawn Stall Primary Care Provider: Fleet Contras Other Clinician: Referring Provider: Treating Provider/Extender: Junious Silk in Treatment: 0 Debridement Performed for Assessment: Wound #5 Right,Plantar Foot Performed By: Physician Geralyn Corwin, DO Debridement Type: Debridement Level of Consciousness (Pre-procedure): Awake and Alert Pre-procedure Verification/Time Out Yes - 09:49 Taken: Start Time: 09:50 Pain Control: Lidocaine 4% T opical Solution Percent of Wound Bed Debrided: 100% T Area Debrided (cm): otal 12.75 Tissue and other material debrided: Viable, Non-Viable, Eschar, Slough, Subcutaneous, Skin: Dermis , Skin: Epidermis, Slough Level: Skin/Subcutaneous Tissue Debridement Description: Excisional Instrument: Blade, Forceps, [name] Bleeding: Minimum End Time: 10:00 Procedural Pain: 0 Post Procedural Pain: 0 Response to Treatment: Procedure was tolerated well Level of Consciousness (Post- Awake and Alert procedure): Post Debridement Measurements of Total Wound Length: (cm) 2.8 Stage: Unstageable/Unclassified Width: (cm) 5.8 Depth: (cm) 1.5 Volume: (cm) 19.132 Character of Wound/Ulcer Post Debridement: Requires Further Debridement Post Procedure Diagnosis Same as Pre-procedure Electronic  Signature(s) Signed: 02/27/2023 2:02:21 PM By: Geralyn Corwin DO Signed: 02/27/2023 6:19:31 PM By: Shawn Stall RN, BSN Entered By: Shawn Stall on 02/27/2023 10:05:14 -------------------------------------------------------------------------------- Debridement Details Patient Name: Date of Service: [name] [name], [name] 02/27/2023 8:30 A M Medical Record Number: [id number] Patient Account Number: [id number] Date of Birth/Sex: Treating RN: July 03, 1986 (37 y.o. Male) Shawn Stall Primary Care Provider: Fleet Contras Other Clinician: Referring Provider: Treating Provider/Extender: Junious Silk in Treatment: 0 Debridement Performed for Assessment: Wound #4 Right,Lateral Ankle Performed By: Physician Geralyn Corwin, DO Debridement Type: Debridement Level of Consciousness (Pre-procedure): Awake and Alert Pre-procedure  Verification/Time Out Yes - 09:49 Taken: SHOURYA, PERROT ([id number]) [id number] Page 3 of 14 Start Time: 09:50 Pain Control: Lidocaine 4% T opical Solution Percent of Wound Bed Debrided: 100% T Area Debrided (cm): otal 3.92 Tissue and other material debrided: Non-Viable, Slough, Slough Level: Non-Viable Tissue Debridement Description: Selective/Open Wound Instrument: Blade, Forceps, [name] Bleeding: Minimum End Time: 10:00 Procedural Pain: 0 Post Procedural Pain: 0 Response to Treatment: Procedure was tolerated well Level of Consciousness (Post- Awake and Alert procedure): Post Debridement Measurements of Total Wound Length: (cm) 2.5 Stage: Category/Stage IV Width: (cm) 2 Depth: (cm) 0.7 Volume: (cm) 2.749 Character of Wound/Ulcer Post Debridement: Requires Further Debridement Post Procedure Diagnosis Same as Pre-procedure Electronic Signature(s) Signed: 02/27/2023 2:02:21 PM By: Geralyn Corwin DO Signed: 02/27/2023 6:19:31 PM By: Shawn Stall RN, BSN Entered By: Shawn Stall on 02/27/2023  10:05:26 -------------------------------------------------------------------------------- HPI Details Patient Name: Date of Service: [name], [name] 02/27/2023 8:30 A M Medical Record Number: [id number] Patient Account Number: [id number] Date of Birth/Sex: Treating RN: [birth year] (37 y.o. Male) Primary Care Provider: Fleet Contras Other Clinician: Referring Provider: Treating Provider/Extender: Junious Silk in Treatment: 0 History of Present Illness HPI Description: 02/27/2023 Mr. Quayshaun wounds is a 37 year old male with a past medical history of HIV, schizoaffective disorder, protein malnutrition, PTSD, alcoholic cirrhosis of the liver that presents to the clinic for a 53-month history of nonhealing wounds to the sacrum, left hip, right knee, and right foot. He is bedbound but it is unclear why. 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 He mostly sits in a wheelchair or in the bed. He does not use bunny boots or Prevalon boots. It is unclear the dressings that have been used but he resides [facility] facility that does his wound care. It was noted that he has been declining wound care treatment at his facility. Currently he denies signs of infection. Electronic Signature(s) Signed: 02/27/2023 2:02:21 PM By: Geralyn Corwin DO Entered By: Geralyn Corwin on 02/27/2023 10:37:58 -------------------------------------------------------------------------------- Physical Exam Details Patient Name: Date of Service: [name] [name], [name] 02/27/2023 8:30 A M Medical Record Number: [id number] Patient Account Number: [id number] Date of Birth/Sex: Treating RN: 18-Nov-1985 (37 y.o. Male) KEYLIN, STETTNER ([id number]) [id number] Page 4 of 14 Primary Care Provider: Fleet Contras Other Clinician: Referring Provider: Treating Provider/Extender: Junious Silk in  Treatment: 0 Constitutional respirations regular, non-labored and within target range for patient.. Cardiovascular 2+ dorsalis pedis/posterior tibialis pulses. Psychiatric pleasant and cooperative. Notes Sacrum: Small open wound with granulation tissue and scant nonviable tissue Left hip: Open wound with nonviable tissue throughout Right knee: Open wound with granulation tissue and scant nonviable tissue Right lateral ankle with nonviable tissue throughout the wound bed to the right plantar foot eschar throughout the wound bed. No obvious signs of soft tissue infection including increased warmth, erythema or purulent drainage. Electronic Signature(s) Signed: 02/27/2023 2:02:21 PM By: Geralyn Corwin DO Entered By: Geralyn Corwin on 02/27/2023 10:30:39 -------------------------------------------------------------------------------- Physician Orders Details Patient Name: Date of Service: [name] [name], [name] 02/27/2023 8:30 A M Medical Record Number: [id number] Patient Account Number: [id number] Date of Birth/Sex: Treating RN: [birth year] (37 y.o. Male) Shawn Stall Primary Care Provider: Fleet Contras Other Clinician: Referring Provider: Treating Provider/Extender: Junious Silk in Treatment: 0 Verbal / Phone Orders: No Diagnosis Coding ICD-10 Coding Code Description L89.153 Pressure ulcer of sacral region, stage 3 L89.220  Pressure ulcer of left hip, unstageable L89.514 Pressure ulcer of right ankle, stage 4 L89.890 Pressure ulcer of other site, unstageable S91.301A Unspecified open wound, right foot, initial encounter L89.893 Pressure ulcer of other site, stage 3 S81.001A Unspecified open wound, right knee, initial encounter B20 Human immunodeficiency virus [HIV] disease K70.30 Alcoholic cirrhosis of liver without ascites L73.2 Hidradenitis suppurativa E43 Unspecified severe protein-calorie malnutrition F25.1 Schizoaffective disorder, depressive  type Follow-up Appointments Return appointment in 3 weeks. - Dr. Mikey Bussing 03/20/2023 1230 room 8 ****Michiel Sites**** extra time 60 minutes******* Anesthetic (In clinic) Topical Lidocaine 4% applied to wound bed Bathing/ Shower/ Hygiene May shower with protection but do not get wound dressing(s) wet. Protect dressing(s) with water repellant cover (for example, large plastic bag) or a cast cover and may then take shower. Off-Loading Low air-loss mattress (Group 2) Turn and reposition every 2 hours TYREQ, BALOUGH ([id number]) [id number] Page 5 of 14 Prevalon Boot - both feet where when sitting in wheelchair and bed. do not wear while transferring to bed and chair. Wound Treatment Wound #1 - Sacrum Cleanser: Wound Cleanser Every Other Day/30 Days Discharge Instructions: Cleanse the wound with wound cleanser prior to applying a clean dressing using gauze sponges, not tissue or cotton balls. Peri-Wound Care: Skin Prep Every Other Day/30 Days Discharge Instructions: Use skin prep as directed Prim Dressing: Hydrofera Blue Ready Transfer Foam, 2.5x2.5 (in/in) Every Other Day/30 Days ary Discharge Instructions: Apply directly to wound bed as directed Secondary Dressing: Zetuvit Plus Silicone Border Dressing 4x4 (in/in) Every Other Day/30 Days Discharge Instructions: Apply silicone border over primary dressing as directed. Wound #2 - Trochanter Wound Laterality: Left Cleanser: Wound Cleanser 1 x Per Day/30 Days Discharge Instructions: Cleanse the wound with wound cleanser prior to applying a clean dressing using gauze sponges, not tissue or cotton balls. Peri-Wound Care: Skin Prep 1 x Per Day/30 Days Discharge Instructions: Use skin prep as directed Topical: Santyl 1 x Per Day/30 Days Discharge Instructions: apply directly to wound bed. Prim Dressing: Hydrofera Blue Ready Transfer Foam, 4x5 (in/in) 1 x Per Day/30 Days ary Discharge Instructions: Apply to wound bed as  instructed Secondary Dressing: Zetuvit Plus Silicone Border Dressing 7x7(in/in) 1 x Per Day/30 Days Discharge Instructions: Apply silicone border over primary dressing as directed. Wound #3 - Knee Wound Laterality: Right, Medial Cleanser: Wound Cleanser Every Other Day/30 Days Discharge Instructions: Cleanse the wound with wound cleanser prior to applying a clean dressing using gauze sponges, not tissue or cotton balls. Peri-Wound Care: Skin Prep Every Other Day/30 Days Discharge Instructions: Use skin prep as directed Prim Dressing: Hydrofera Blue Ready Transfer Foam, 2.5x2.5 (in/in) Every Other Day/30 Days ary Discharge Instructions: Apply directly to wound bed as directed Secondary Dressing: Zetuvit Plus Silicone Border Dressing 4x4 (in/in) Every Other Day/30 Days Discharge Instructions: Apply silicone border over primary dressing as directed. Wound #4 - Ankle Wound Laterality: Right, Lateral Cleanser: Wound Cleanser 1 x Per Day/30 Days Discharge Instructions: Cleanse the wound with wound cleanser prior to applying a clean dressing using gauze sponges, not tissue or cotton balls. Prim Dressing: Dakin's Solution 0.25%, 16 (oz) 1 x Per Day/30 Days ary Discharge Instructions: Moisten gauze with Dakin's solution Secondary Dressing: ABD pad 1 x Per Day/30 Days Secondary Dressing: Kerlix 1 x Per Day/30 Days Secured With: 25M Medipore H Soft Cloth Surgical T ape, 4 x 10 (in/yd) 1 x Per Day/30 Days Discharge Instructions: Secure with tape as directed. Wound #5 - Foot Wound Laterality: Plantar, Right Cleanser: Wound Cleanser 1 x Per  Day/30 Days Discharge Instructions: Cleanse the wound with wound cleanser prior to applying a clean dressing using gauze sponges, not tissue or cotton balls. Prim Dressing: Dakin's Solution 0.25%, 16 (oz) 1 x Per Day/30 Days ary Discharge Instructions: Moisten gauze with Dakin's solution Secondary Dressing: ABD Pad, 8x10 1 x Per Day/30 Days Discharge Instructions:  Apply over primary dressing as directed. Secured With: [employer], 4.5x3.1 (in/yd) 1 x Per Day/30 Days Discharge Instructions: Secure with Kerlix as directed. Secured With: 33M Medipore H Soft Cloth Surgical T ape, 4 x 10 (in/yd) 1 x Per Day/30 Days Discharge Instructions: Secure with tape as directed. JAYDEL, RITTENHOUSE ([id number]) [id number] Page 6 of 14 Patient Medications llergies: Aczone, pollen extracts, primaquine A Notifications Medication Indication Start End applied only in clinic for5/20/2024 lidocaine debridements. DOSE topical 5 % cream - cream topical once daily Electronic Signature(s) Signed: 02/27/2023 2:02:21 PM By: Geralyn Corwin DO Signed: 02/27/2023 6:19:31 PM By: Shawn Stall RN, BSN Entered By: Shawn Stall on 02/27/2023 12:02:49 -------------------------------------------------------------------------------- Problem List Details Patient Name: Date of Service: [name] [name], [name] 02/27/2023 8:30 A M Medical Record Number: [id number] Patient Account Number: [id number] Date of Birth/Sex: Treating RN: 06/16/1986 (37 y.o. 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, stage 4 02/27/2023 No Yes L89.890 Pressure ulcer of other site, unstageable 02/27/2023 No Yes S91.301A Unspecified open wound, right foot, initial encounter 02/27/2023 No Yes L89.893 Pressure ulcer of other site, stage 3 02/27/2023 No Yes S81.001A Unspecified open wound, right knee, initial encounter 02/27/2023 No Yes B20 Human immunodeficiency virus [HIV] disease 02/27/2023 No Yes K70.30 Alcoholic  cirrhosis of liver without ascites 02/27/2023 No Yes L73.2 Hidradenitis suppurativa 02/27/2023 No Yes Cartier, Ralpheal L ([id number]) [id number] Page 7 of 14 E43 Unspecified severe protein-calorie malnutrition 02/27/2023 No Yes F25.1 Schizoaffective disorder, depressive type 02/27/2023 No Yes Inactive Problems Resolved Problems Electronic Signature(s) Signed: 02/27/2023 2:02:21 PM By: Geralyn Corwin DO Entered By: Geralyn Corwin on 02/27/2023 10:25:47 -------------------------------------------------------------------------------- Progress Note Details Patient Name: Date of Service: [name], [name] 02/27/2023 8:30 A M Medical Record Number: [id number] Patient Account Number: [id number] Date of Birth/Sex: Treating RN: Jan 02, 1986 (37 y.o. Male) Primary Care Provider: Fleet Contras Other Clinician: Referring Provider: Treating Provider/Extender: Junious Silk in Treatment: 0 Subjective Chief Complaint Information obtained from Patient 02/27/2023; Open wounds to the sacrum, left hip, right knee, right ankle and right foot History of Present Illness (HPI) 02/27/2023 Mr. Britten wounds is a 37 year old male with a past medical history of HIV, schizoaffective disorder, protein malnutrition, PTSD, alcoholic cirrhosis of the liver that presents to the clinic for a 17-month history of nonhealing wounds to the sacrum, left hip, right knee, and right foot. He is bedbound but it is unclear why. 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 He mostly sits in a wheelchair or in the bed. He does not use bunny boots or Prevalon boots. It is unclear the dressings that have been used but he resides [facility] facility that does his wound care. It was noted that he has been declining wound care treatment at his facility. Currently he denies signs of infection.  Patient History Information obtained  from Chart. Allergies Aczone, pollen extracts, primaquine Family History Seizures - Father, No family history of Cancer, Diabetes, Heart Disease, Hereditary Spherocytosis, Hypertension, Kidney Disease, Lung Disease, Stroke, Thyroid Problems, Tuberculosis. Social History Current every day smoker, Marital Status - Single, Drug Use - No History, Caffeine Use - Never. Medical History Eyes Denies history of Cataracts, Glaucoma, Optic Neuritis Ear/Nose/Mouth/Throat Denies history of Chronic sinus problems/congestion, Middle ear problems Hematologic/Lymphatic Patient has history of Human Immunodeficiency Virus Denies history of Anemia, Hemophilia, Lymphedema, Sickle Cell Disease Respiratory Denies history of Aspiration, Asthma, Chronic Obstructive Pulmonary Disease (COPD), Pneumothorax, Sleep Apnea, Tuberculosis Cardiovascular Patient has history of Hypertension Denies history of Angina, Arrhythmia, Congestive Heart Failure, Coronary Artery Disease, Deep Vein Thrombosis, Hypotension, Myocardial Infarction, Peripheral Arterial Disease, Peripheral Venous Disease, Phlebitis, Vasculitis Lankford, Carlyle L ([id number]) [id number] Page 8 of 14 Gastrointestinal Patient has history of Cirrhosis - alcoholic Denies history of Colitis, Crohns, Hepatitis A, Hepatitis B, Hepatitis C Endocrine Denies history of Type I Diabetes, Type II Diabetes Genitourinary Denies history of End Stage Renal Disease Immunological Denies history of Lupus Erythematosus, Raynauds, Scleroderma Integumentary (Skin) Denies history of History of Burn Musculoskeletal Denies history of Gout, Rheumatoid Arthritis, Osteoarthritis, Osteomyelitis Neurologic Patient has history of Neuropathy, Seizure Disorder Denies history of Dementia, Quadriplegia, Paraplegia Oncologic Denies history of Received Chemotherapy, Received Radiation Psychiatric Patient has history of Confinement Anxiety Denies  history of Anorexia/bulimia Hospitalization/Surgery History - hand surgery. - back surgery. 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 Ear/Nose/Mouth/Throat Denies complaints or symptoms of Chronic sinus problems or rhinitis. Respiratory Denies complaints or symptoms of Chronic or frequent coughs, Shortness of Breath. Endocrine Denies complaints or symptoms of Heat/cold intolerance. Genitourinary Denies complaints or symptoms of Frequent urination. Integumentary (Skin) BLE, left hip, buttock Musculoskeletal Denies complaints or symptoms of Muscle Pain, Muscle Weakness. Psychiatric Denies complaints or symptoms of Claustrophobia. Objective Constitutional respirations regular, non-labored and within target range for patient.. Vitals Time Taken: 8:48 AM, Height: 75 in, Source: Stated, Weight: 147 lbs, Source: Stated, BMI: 18.4, Temperature: 97.9 F, Pulse: 120 bpm, Respiratory Rate: 18 breaths/min, Blood Pressure: 107/75 mmHg. Cardiovascular 2+ dorsalis pedis/posterior tibialis pulses. Psychiatric pleasant and cooperative. General Notes: Sacrum: Small open wound with granulation tissue and scant nonviable tissue Left hip: Open wound with nonviable tissue throughout Right knee: Open wound with granulation tissue and scant nonviable tissue Right lateral ankle with nonviable tissue throughout the wound bed to the right plantar foot eschar throughout the wound bed. No obvious signs of soft tissue infection including increased warmth, erythema or purulent drainage. Integumentary (Hair, Skin) Wound #1  status is Open. Original cause of wound was Pressure Injury. The date acquired was: 10/10/2022. The wound is located on the Sacrum. The wound measures 0.5cm length x 0.5cm width x 0.4cm depth; 0.196cm^2 area and 0.079cm^3 volume. There is Fat Layer (Subcutaneous Tissue) exposed. There is no tunneling or undermining noted. There is a medium amount of serosanguineous drainage noted. The wound margin is distinct with the outline attached to the wound base. There is large (67-100%) red, pink granulation within the wound bed. There is no necrotic tissue within the wound bed. The periwound skin appearance did not exhibit: Callus, Crepitus, Excoriation, Induration,  Rash, Scarring, Dry/Scaly, Maceration, Atrophie Blanche, Cyanosis, Ecchymosis, Hemosiderin Staining, Mottled, Pallor, Rubor, Erythema. Wound #2 status is Open. Original cause of wound was Pressure Injury. The date acquired was: 12/09/2022. The wound is located on the Left Trochanter. The wound measures 6.5cm length x 5.9cm width x 0.2cm depth; 30.12cm^2 area and 6.024cm^3 volume. There is no tunneling or undermining noted. There is a medium amount of serosanguineous drainage noted. The wound margin is distinct with the outline attached to the wound base. There is no granulation within the wound bed. There is a large (67-100%) amount of necrotic tissue within the wound bed including Adherent Slough. The periwound skin appearance exhibited: KASHTEN, SHUEY ([id number]) [id number] Page 9 of 14 Ecchymosis. The periwound skin appearance did not exhibit: Callus, Crepitus, Excoriation, Induration, Rash, Scarring, Dry/Scaly, Maceration, Atrophie Blanche, Cyanosis, Hemosiderin Staining, Mottled, Pallor, Rubor, Erythema. Wound #3 status is Open. Original cause of wound was Pressure Injury. The date acquired was: 12/12/2022. The wound is located on the Right,Medial Knee. The wound measures 3.7cm length x 2cm width x 0.1cm depth;  5.812cm^2 area and 0.581cm^3 volume. There is Fat Layer (Subcutaneous Tissue) exposed. There is no tunneling or undermining noted. There is a medium amount of serosanguineous drainage noted. The wound margin is distinct with the outline attached to the wound base. There is large (67-100%) red, pink granulation within the wound bed. There is no necrotic tissue within the wound bed. The periwound skin appearance did not exhibit: Callus, Crepitus, Excoriation, Induration, Rash, Scarring, Dry/Scaly, Maceration, Atrophie Blanche, Cyanosis, Ecchymosis, Hemosiderin Staining, Mottled, Pallor, Rubor, Erythema. Wound #4 status is Open. Original cause of wound was Pressure Injury. The date acquired was: 10/10/2022. The wound is located on the Right,Lateral Ankle. The wound measures 3.4cm length x 2.5cm width x 0.7cm depth; 6.676cm^2 area and 4.673cm^3 volume. There is muscle and Fat Layer (Subcutaneous Tissue) exposed. There is no tunneling or undermining noted. There is a large amount of serosanguineous drainage noted. Foul odor after cleansing was noted. The wound margin is epibole. There is small (1-33%) pink granulation within the wound bed. There is a large (67-100%) amount of necrotic tissue within the wound bed including Eschar and Adherent Slough. The periwound skin appearance did not exhibit: Callus, Crepitus, Excoriation, Induration, Rash, Scarring, Dry/Scaly, Maceration, Atrophie Blanche, Cyanosis, Ecchymosis, Hemosiderin Staining, Mottled, Pallor, Rubor, Erythema. Wound #5 status is Open. Original cause of wound was Gradually Appeared. The date acquired was: 10/10/2022. The wound is located on the Right,Plantar Foot. The wound measures 2.8cm length x 5.8cm width x 1.5cm depth; 12.755cm^2 area and 19.132cm^3 volume. There is no tunneling or undermining noted. There is a medium amount of purulent drainage noted. Foul odor after cleansing was noted. The wound margin is distinct with the outline attached to  the wound base. There is no granulation within the wound bed. There is a large (67-100%) amount of necrotic tissue within the wound bed including Eschar and Adherent Slough. The periwound skin appearance did not exhibit: Callus, Crepitus, Excoriation, Induration, Rash, Scarring, Dry/Scaly, Maceration, Atrophie Blanche, Cyanosis, Ecchymosis, Hemosiderin Staining, Mottled, Pallor, Rubor, Erythema. 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 Again it is unclear why he is bedbound. He states he has an air mattress. I debrided nonviable tissue. I recommended Hydrofera Blue to the sacrum and knee and Hydrofera Blue and Santyl to the left hip. I recommended Dakin's wet-to-dry dressings to the right feet wounds along with aggressive offloading with Prevalon boots. We discussed the importance of aggressive offloading for his wound healing. He expressed understanding. No obvious signs of infection on exam. Unfortunately he has a lot of barriers to healing including protein malnutrition, cirrhosis and HIV. His wounds may not heal without aggressive offloading and patient agreeable to dressing changes. I will see him back in 3 weeks. Procedures Wound #2 Pre-procedure diagnosis of Wound #2 is a Pressure Ulcer located on the Left Trochanter . There was a Chemical/Enzymatic/Mechanical debridement performed by  Shawn Stall, RN. after achieving pain control using Lidocaine 4% Topical Solution. Agent used was [employer]. A Minimum amount of bleeding was controlled with [name]. The procedure was tolerated well. Post Debridement Measurements: 6.5cm length x 5.9cm width x 0.2cm depth; 6.024cm^3 volume. Post debridement Stage noted as Unstageable/Unclassified. Character of Wound/Ulcer Post Debridement requires further debridement. Post procedure Diagnosis Wound #2: Same as Pre-Procedure Wound #4 Pre-procedure diagnosis of Wound #4 is a Pressure Ulcer located on the Right,Lateral Ankle . There was a Selective/Open Wound Non-Viable Tissue Debridement with a total area of 3.92 sq cm performed by Geralyn Corwin, DO. With the following instrument(s): Blade, Forceps to remove Non-Viable tissue/material. Material removed includes Slough after achieving pain control using Lidocaine 4% Topical Solution. A time out was conducted at 09:49, prior to the start of the procedure. A Minimum amount of bleeding was controlled with [name]. The procedure was tolerated well with a pain level of 0 throughout and a pain level of 0 following the procedure. Post Debridement Measurements: 2.5cm length x 2cm width x 0.7cm depth; 2.749cm^3 volume. Post debridement Stage noted as Category/Stage IV. Character of Wound/Ulcer Post Debridement requires further debridement. Post procedure Diagnosis Wound #4: Same as Pre-Procedure Wound #5 Pre-procedure diagnosis of Wound #5 is a Pressure Ulcer located on the Right,Plantar Foot . There was a Excisional Skin/Subcutaneous Tissue Debridement with a total area of 12.75 sq cm performed by Geralyn Corwin, DO. With the following instrument(s): Blade, Forceps to remove Viable and Non-Viable tissue/material. Material removed includes Eschar, Subcutaneous Tissue, Slough, Skin: Dermis, and Skin: Epidermis after achieving pain control using Lidocaine 4% T opical Solution. A time out was conducted at 09:49,  prior to the start of the procedure. A Minimum amount of bleeding was controlled with [name]. The procedure was tolerated well with a pain level of 0 throughout and a pain level of 0 following the procedure. Post Debridement Measurements: 2.8cm length x 5.8cm width x 1.5cm depth; 19.132cm^3 volume. Post debridement Stage noted as Unstageable/Unclassified. Character of Wound/Ulcer Post Debridement requires further debridement. Post procedure Diagnosis Wound #5: Same as Pre-Procedure ANIEL, WINNS ([id number]) [id number] Page 10 of 14 Plan Follow-up Appointments: Return appointment in 3 weeks. - Dr. Mikey Bussing 03/20/2023 1230 room 8 ****Michiel Sites**** extra time 60 minutes******* Anesthetic: (In clinic) Topical Lidocaine 4% applied to wound bed Bathing/ Shower/ Hygiene: May shower with protection but do not get wound dressing(s) wet. Protect dressing(s) with water repellant cover (for example, large plastic bag) or a cast cover and may then take shower. Off-Loading: Low air-loss mattress (Group 2) Turn and reposition every 2 hours Prevalon Boot - both feet  where when sitting in wheelchair and bed. do not wear while transferring to bed and chair. The following medication(s) was prescribed: lidocaine topical 5 % cream cream topical once daily for applied only in clinic for debridements. was prescribed at facility WOUND #1: - Sacrum Wound Laterality: Cleanser: Wound Cleanser Every Other Day/30 Days Discharge Instructions: Cleanse the wound with wound cleanser prior to applying a clean dressing using gauze sponges, not tissue or cotton balls. Peri-Wound Care: Skin Prep Every Other Day/30 Days Discharge Instructions: Use skin prep as directed Prim Dressing: Hydrofera Blue Ready Transfer Foam, 2.5x2.5 (in/in) Every Other Day/30 Days ary Discharge Instructions: Apply directly to wound bed as directed Secondary Dressing: Zetuvit Plus Silicone Border Dressing 4x4 (in/in) Every  Other Day/30 Days Discharge Instructions: Apply silicone border over primary dressing as directed. WOUND #2: - Trochanter Wound Laterality: Left Cleanser: Wound Cleanser 1 x Per Day/30 Days Discharge Instructions: Cleanse the wound with wound cleanser prior to applying a clean dressing using gauze sponges, not tissue or cotton balls. Peri-Wound Care: Skin Prep 1 x Per Day/30 Days Discharge Instructions: Use skin prep as directed Topical: Santyl 1 x Per Day/30 Days Discharge Instructions: apply directly to wound bed. Prim Dressing: Hydrofera Blue Ready Transfer Foam, 4x5 (in/in) 1 x Per Day/30 Days ary Discharge Instructions: Apply to wound bed as instructed Secondary Dressing: Zetuvit Plus Silicone Border Dressing 7x7(in/in) 1 x Per Day/30 Days Discharge Instructions: Apply silicone border over primary dressing as directed. WOUND #3: - Knee Wound Laterality: Right, Medial Cleanser: Wound Cleanser Every Other Day/30 Days Discharge Instructions: Cleanse the wound with wound cleanser prior to applying a clean dressing using gauze sponges, not tissue or cotton balls. Peri-Wound Care: Skin Prep Every Other Day/30 Days Discharge Instructions: Use skin prep as directed Prim Dressing: Hydrofera Blue Ready Transfer Foam, 2.5x2.5 (in/in) Every Other Day/30 Days ary Discharge Instructions: Apply directly to wound bed as directed Secondary Dressing: Zetuvit Plus Silicone Border Dressing 4x4 (in/in) Every Other Day/30 Days Discharge Instructions: Apply silicone border over primary dressing as directed. WOUND #4: - Ankle Wound Laterality: Right, Lateral Cleanser: Wound Cleanser 1 x Per Day/30 Days Discharge Instructions: Cleanse the wound with wound cleanser prior to applying a clean dressing using gauze sponges, not tissue or cotton balls. Prim Dressing: Dakin's Solution 0.25%, 16 (oz) 1 x Per Day/30 Days ary Discharge Instructions: Moisten gauze with Dakin's solution Secondary Dressing: ABD pad 1 x  Per Day/30 Days Secondary Dressing: Kerlix 1 x Per Day/30 Days Secured With: 69M Medipore H Soft Cloth Surgical T ape, 4 x 10 (in/yd) 1 x Per Day/30 Days Discharge Instructions: Secure with tape as directed. WOUND #5: - Foot Wound Laterality: Plantar, Right Cleanser: Wound Cleanser 1 x Per Day/30 Days Discharge Instructions: Cleanse the wound with wound cleanser prior to applying a clean dressing using gauze sponges, not tissue or cotton balls. Prim Dressing: Dakin's Solution 0.25%, 16 (oz) 1 x Per Day/30 Days ary Discharge Instructions: Moisten gauze with Dakin's solution Secondary Dressing: ABD Pad, 8x10 1 x Per Day/30 Days Discharge Instructions: Apply over primary dressing as directed. Secured With: [employer], 4.5x3.1 (in/yd) 1 x Per Day/30 Days Discharge Instructions: Secure with Kerlix as directed. Secured With: 69M Medipore H Soft Cloth Surgical T ape, 4 x 10 (in/yd) 1 x Per Day/30 Days Discharge Instructions: Secure with tape as directed. 1. In office sharp debridement 2. Hydrofera Blue 3. Santyl 4. Dakin's wet-to-dry dressings 5. Follow-up in 3 weeks Electronic Signature(s) Signed: 05/03/2023 10:41:50 AM By: Pearletha Alfred  Signed: 05/03/2023 11:08:39 AM By: Geralyn Corwin DO Previous Signature: 04/10/2023 2:35:08 PM Version By: Pearletha Alfred Previous Signature: 04/10/2023 2:57:08 PM Version By: Geralyn Corwin DO Previous Signature: 02/27/2023 2:02:21 PM Version By: Geralyn Corwin DO Previous Signature: 02/27/2023 6:19:31 PM Version By: Shawn Stall RN, BSN Entered By: Pearletha Alfred on 05/03/2023 10:41:50 Trulock, Candice Camp ([id number]) [id number] Page 11 of 14 -------------------------------------------------------------------------------- HxROS Details Patient Name: Date of Service: Damita Dunnings, [name] 02/27/2023 8:30 A M Medical Record Number: [id number] Patient Account Number: [id number] Date of Birth/Sex: Treating RN: 1986/06/03 (37 y.o.  Male) Shawn Stall Primary Care Provider: Fleet Contras Other Clinician: Referring Provider: Treating Provider/Extender: Junious Silk in Treatment: 0 Information Obtained From Chart Constitutional Symptoms (General Health) Complaints and Symptoms: Positive for: Chills Review of System Notes: From [facility] Medical History: Past Medical History Notes: hypothermia migraines FTT Eyes Complaints and Symptoms: Negative for: Dry Eyes; Vision Changes; Glasses / Contacts Medical History: Negative for: Cataracts; Glaucoma; Optic Neuritis Ear/Nose/Mouth/Throat Complaints and Symptoms: Negative for: Chronic sinus problems or rhinitis Medical History: Negative for: Chronic sinus problems/congestion; Middle ear problems Respiratory Complaints and Symptoms: Negative for: Chronic or frequent coughs; Shortness of Breath Medical History: Negative for: Aspiration; Asthma; Chronic Obstructive Pulmonary Disease (COPD); Pneumothorax; Sleep Apnea; Tuberculosis Endocrine Complaints and Symptoms: Negative for: Heat/cold intolerance Medical History: Negative for: Type I Diabetes; Type II Diabetes Genitourinary Complaints and Symptoms: Negative for: Frequent urination Medical History: Negative for: End Stage Renal Disease Musculoskeletal Complaints and Symptoms: Negative for: Muscle Pain; Muscle Weakness Medical History: Negative for: Gout; Rheumatoid Arthritis; Osteoarthritis; Osteomyelitis Betty, Mahki L ([id number]) [id number] Page 12 of 14 Psychiatric Complaints and Symptoms: Negative for: Claustrophobia Medical History: Positive for: Confinement Anxiety Negative for: Anorexia/bulimia Past Medical History Notes: bipolar 1 disorder schizoaffective disorder PTSD depression Hematologic/Lymphatic Medical History: Positive for: Human Immunodeficiency Virus Negative for: Anemia; Hemophilia;  Lymphedema; Sickle Cell Disease Cardiovascular Medical History: Positive for: Hypertension Negative for: Angina; Arrhythmia; Congestive Heart Failure; Coronary Artery Disease; Deep Vein Thrombosis; Hypotension; Myocardial Infarction; Peripheral Arterial Disease; Peripheral Venous Disease; Phlebitis; Vasculitis Gastrointestinal Medical History: Positive for: Cirrhosis - alcoholic Negative for: Colitis; Crohns; Hepatitis A; Hepatitis B; Hepatitis C Immunological Medical History: Negative for: Lupus Erythematosus; Raynauds; Scleroderma Integumentary (Skin) Complaints and Symptoms: Review of System Notes: BLE, left hip, buttock Medical History: Negative for: History of Burn Past Medical History Notes: Herpes Neurologic Medical History: Positive for: Neuropathy; Seizure Disorder Negative for: Dementia; Quadriplegia; Paraplegia Oncologic Medical History: Negative for: Received Chemotherapy; Received Radiation Immunizations Pneumococcal Vaccine: Received Pneumococcal Vaccination: Yes Received Pneumococcal Vaccination On or After 60th Birthday: No Implantable Devices No devices added Hospitalization / Surgery History Type of Hospitalization/Surgery hand surgery back surgery Family and Social History Cancer: No; Diabetes: No; Heart Disease: No; Hereditary Spherocytosis: No; Hypertension: No; Kidney Disease: No; Lung Disease: No; Seizures: Yes - Father; Stroke: No; Thyroid Problems: No; Tuberculosis: No; Current every day smoker; Marital Status - Single; Drug Use: No History; Caffeine Use: Never; Financial Concerns: No; Food, Clothing or Shelter Needs: No; Support System Lacking: No; Transportation Concerns: No Electronic Signature(s) LEARY, BABAYEV ([id number]) [id number] Page 13 of 14 Signed: 02/27/2023 2:02:21 PM By: Geralyn Corwin DO Signed: 02/27/2023 6:19:31 PM By: Shawn Stall RN, BSN Signed: 03/03/2023 11:53:23 AM By: Thayer Dallas Entered  By: Thayer Dallas on 02/27/2023 08:57:33 -------------------------------------------------------------------------------- SuperBill Details Patient Name: Date of Service: [name] [name], [name] 02/27/2023 Medical Record Number: [id number] Patient Account Number: [id number] Date of  Birth/Sex: Treating RN: [birth year] (37 y.o. Male) Primary Care Provider: Fleet Contras Other Clinician: Referring Provider: Treating Provider/Extender: Junious Silk in Treatment: 0 Diagnosis Coding ICD-10 Codes Code Description [phone] Pressure ulcer of sacral region, stage 3 L89.220 Pressure ulcer of left hip, unstageable L89.514 Pressure ulcer of right ankle, stage 4 L89.890 Pressure ulcer of other site, unstageable S91.301A Unspecified open wound, right foot, initial encounter L89.893 Pressure ulcer of other site, stage 3 S81.001A Unspecified open wound, right knee, initial encounter B20 Human immunodeficiency virus [HIV] disease K70.30 Alcoholic cirrhosis of liver without ascites L73.2 Hidradenitis suppurativa E43 Unspecified severe protein-calorie malnutrition F25.1 Schizoaffective disorder, depressive type Facility Procedures : CPT4 Code: [id number] Description: 99214 - WOUND CARE VISIT-LEV 4 EST PT Modifier: Quantity: 1 : CPT4 Code: 34742595 Description: 11042 - DEB SUBQ TISSUE 20 SQ CM/< ICD-10 Diagnosis Description L89.890 Pressure ulcer of other site, unstageable S91.301A Unspecified open wound, right foot, initial encounter Modifier: Quantity: 1 : CPT4 Code: 63875643 Description: 97597 - DEBRIDE WOUND 1ST 20 SQ CM OR < ICD-10 Diagnosis Description L89.514 Pressure ulcer of right ankle, stage 4 Modifier: Quantity: 1 : CPT4 Code: [id number] Description: 16606 - DEBRIDE W/O ANES NON SELECT Modifier: 59 Quantity: 1 Physician Procedures : CPT4 Code Description Modifier [id number] 99204 - WC PHYS LEVEL 4 - NEW PT 25 ICD-10 Diagnosis Description L89.153 Pressure ulcer  of sacral region, stage 3 L89.220 Pressure ulcer of left hip, unstageable L89.514 Pressure ulcer of right ankle, stage 4  S91.301A Unspecified open wound, right foot, initial encounter Quantity: 1 : [id number] 11042 - WC PHYS SUBQ TISS 20 SQ CM ICD-10 Diagnosis Description L89.890 Pressure ulcer of other site, unstageable S91.301A Unspecified open wound, right foot, initial encounter Quantity: 1 : [id number] 97597 - WC PHYS DEBR Drew ANESTH 20 SQ CM Epple, Carel L ([id number]) [id number] Quantity: 1 .pdf Page 14 of 14 : ICD-10 Diagnosis Description L89.514 Pressure ulcer of right ankle, stage 4 Quantity: Electronic Signature(s) Signed: 02/27/2023 2:02:21 PM By: Geralyn Corwin DO Signed: 02/27/2023 6:19:31 PM By: Shawn Stall RN, BSN Entered By: Shawn Stall on 02/27/2023 12:05:42

## 2023-04-10 DEATH — deceased

## 2023-04-11 NOTE — Progress Notes (Signed)
ABHIJIT, NOLTING ([id number]) [id number] Page 1 of 13 Visit Report for 02/27/2023 Allergy List Details Patient Name: Date of Service: [name] NTWA NNE [name]. 02/27/2023 8:30 Drew M Medical Record Number: [id number] Patient Account Number: [id number] Date of Birth/Sex: Treating RN: 11/09/1985 (37 y.o. Male) Shawn Stall Primary Care Emrie Gayle: Fleet Contras Other Clinician: Referring Laityn Bensen: Treating Lang Zingg/Extender: Junious Silk in Treatment: 0 Allergies Active Allergies Aczone pollen extracts primaquine Allergy Notes Electronic Signature(s) Signed: 03/03/2023 11:53:23 AM By: Thayer Dallas Entered By: Thayer Dallas on 02/27/2023 08:52:18 -------------------------------------------------------------------------------- Arrival Information Details Patient Name: Date of Service: [name], Drew NTWA NNE [name]. 02/27/2023 8:30 Drew M Medical Record Number: [id number] Patient Account Number: [id number] Date of Birth/Sex: Treating RN: May 09, 1986 (37 y.o. Male) Primary Care Milea Klink: Fleet Contras Other Clinician: Referring Milynn Quirion: Treating Cecile Gillispie/Extender: Junious Silk in Treatment: 0 Visit Information Patient Arrived: Wheel Chair Arrival Time: 08:47 Accompanied By: self Transfer Assistance: Manual Patient Identification Verified: Yes Secondary Verification Process Completed: Yes Patient Requires Transmission-Based Precautions: No Patient Has Alerts: Yes Electronic Signature(s) Signed: 03/03/2023 11:53:23 AM By: Thayer Dallas Entered By: Thayer Dallas on 02/27/2023 09:31:26 [name], [name] ([id number]) [id number] Page 2 of 13 -------------------------------------------------------------------------------- Clinic Level of Care Assessment Details Patient Name: Date of Service: [name] NTWA NNE [name]. 02/27/2023 8:30 Drew M Medical Record Number: [id number] Patient Account Number:  [id number] Date of Birth/Sex: Treating RN: 1986/08/15 (37 y.o. Male) Shawn Stall Primary Care Kyren Vaux: Fleet Contras Other Clinician: Referring Simar Pothier: Treating Jeffie Widdowson/Extender: Junious Silk in Treatment: 0 Clinic Level of Care Assessment Items TOOL 1 Quantity Score X- 1 0 Use when EandM and Procedure is performed on INITIAL visit ASSESSMENTS - Nursing Assessment / Reassessment X- 1 20 General Physical Exam (combine w/ comprehensive assessment (listed just below) when performed on new pt. evals) X- 1 25 Comprehensive Assessment (HX, ROS, Risk Assessments, Wounds Hx, etc.) ASSESSMENTS - Wound and Skin Assessment / Reassessment X- 1 10 Dermatologic / Skin Assessment (not related to wound area) ASSESSMENTS - Ostomy and/or Continence Assessment and Care []  - 0 Incontinence Assessment and Management []  - 0 Ostomy Care Assessment and Management (repouching, etc.) PROCESS - Coordination of Care []  - 0 Simple Patient / Family Education for ongoing care X- 1 20 Complex (extensive) Patient / Family Education for ongoing care X- 1 10 Staff obtains Chiropractor, Records, T Results / Process Orders est X- 1 10 Staff telephones HHA, Nursing Homes / Clarify orders / etc []  - 0 Routine Transfer to another Facility (non-emergent condition) []  - 0 Routine Hospital Admission (non-emergent condition) X- 1 15 New Admissions / Manufacturing engineer / Ordering NPWT Apligraf, etc. , []  - 0 Emergency Hospital Admission (emergent condition) PROCESS - Special Needs []  - 0 Pediatric / Minor Patient Management []  - 0 Isolation Patient Management []  - 0 Hearing / Language / Visual special needs []  - 0 Assessment of Community assistance (transportation, D/C planning, etc.) []  - 0 Additional assistance / Altered mentation X- 1 15 Support Surface(s) Assessment (bed, cushion, seat, etc.) INTERVENTIONS - Miscellaneous []  - 0 External ear exam []  - 0 Patient  Transfer (multiple staff / Nurse, adult / Similar devices) []  - 0 Simple Staple / Suture removal (25 or less) []  - 0 Complex Staple / Suture removal (26 or more) []  - 0 Hypo/Hyperglycemic Management (do not check if billed separately) []  - 0 Ankle / Brachial Index (ABI) - do not check if billed separately Has the  patient been seen at the hospital within the last three years: Yes Total Score: 125 Level Of Care: New/Established - Level 4 Electronic Signature(s) Signed: 02/27/2023 6:19:31 PM By: Shawn Stall RN, BSN Entered By: Shawn Stall on 02/27/2023 12:05:22 [name], [name] ([id number]) [id number] Page 3 of 13 -------------------------------------------------------------------------------- Encounter Discharge Information Details Patient Name: Date of Service: [name] NTWA NNE [name]. 02/27/2023 8:30 Drew M Medical Record Number: [id number] Patient Account Number: [id number] Date of Birth/Sex: Treating RN: 08/19/1986 (37 y.o. Male) Shawn Stall Primary Care Areta Terwilliger: Fleet Contras Other Clinician: Referring Kamisha Ell: Treating Karanvir Balderston/Extender: Junious Silk in Treatment: 0 Encounter Discharge Information Items Post Procedure Vitals Discharge Condition: Stable Temperature (F): 97.9 Ambulatory Status: Wheelchair Pulse (bpm): 120 Discharge Destination: Skilled Nursing Facility Respiratory Rate (breaths/min): 18 Telephoned: No Blood Pressure (mmHg): 107/75 Orders Sent: Yes Transportation: Private Auto Accompanied By: self Schedule Follow-up Appointment: Yes Clinical Summary of Care: Electronic Signature(s) Signed: 02/27/2023 6:19:31 PM By: Shawn Stall RN, BSN Entered By: Shawn Stall on 02/27/2023 12:07:23 -------------------------------------------------------------------------------- Lower Extremity Assessment Details Patient Name: Date of Service: [name], Drew NTWA NNE [name]. 02/27/2023 8:30 Drew M Medical Record Number:  [id number] Patient Account Number: [id number] Date of Birth/Sex: Treating RN: Nov 25, 1985 (37 y.o. Male) Shawn Stall Primary Care Kaarin Pardy: Fleet Contras Other Clinician: Referring Haelyn Forgey: Treating Lamija Besse/Extender: Junious Silk in Treatment: 0 Edema Assessment Assessed: Kyra Searles: No] [Right: Yes] Edema: [Left: Ye] [Right: s] Calf Left: Right: Point of Measurement: From Medial Instep 57 cm Ankle Left: Right: Point of Measurement: From Medial Instep 44 cm Vascular Assessment Pulses: Dorsalis Pedis Palpable: [Right:Yes] Electronic Signature(s) Signed: 02/27/2023 6:19:31 PM By: Shawn Stall RN, BSN Entered By: Shawn Stall on 02/27/2023 09:38:17 [name], [name] ([id number]) [id number] Page 4 of 13 -------------------------------------------------------------------------------- Multi Wound Chart Details Patient Name: Date of Service: [name] NTWA NNE [name]. 02/27/2023 8:30 Drew M Medical Record Number: [id number] Patient Account Number: [id number] Date of Birth/Sex: Treating RN: 1985/12/14 (37 y.o. Male) Primary Care Jerritt Cardoza: Fleet Contras Other Clinician: Referring Finley Dinkel: Treating Chandni Gagan/Extender: Junious Silk in Treatment: 0 Vital Signs Height(in): 75 Pulse(bpm): 120 Weight(lbs): 147 Blood Pressure(mmHg): 107/75 Body Mass Index(BMI): 18.4 Temperature(F): 97.9 Respiratory Rate(breaths/min): 18 [1:Photos:] Sacrum Left Trochanter Right, Medial Knee Wound Location: Pressure Injury Pressure Injury Pressure Injury Wounding Event: Pressure Ulcer Pressure Ulcer Pressure Ulcer Primary Etiology: Human Immunodeficiency Virus, Human Immunodeficiency Virus, Human Immunodeficiency Virus, Comorbid History: Hypertension, Cirrhosis , Neuropathy, Hypertension, Cirrhosis , Neuropathy, Hypertension, Cirrhosis , Neuropathy, Seizure Disorder, Confinement Anxiety Seizure Disorder, Confinement Anxiety Seizure  Disorder, Confinement Anxiety 10/10/2022 12/09/2022 12/12/2022 Date Acquired: 0 0 0 Weeks of Treatment: Open Open Open Wound Status: No No No Wound Recurrence: 0.5x0.5x0.4 6.5x5.9x0.2 3.7x2x0.1 Measurements [name] x W x D (cm) 0.196 30.12 5.812 Drew (cm) : rea 0.079 6.024 0.581 Volume (cm) : Category/Stage III Unstageable/Unclassified Category/Stage III Classification: Medium Medium Medium Exudate Drew mount: Serosanguineous Serosanguineous Serosanguineous Exudate Type: red, brown red, brown red, brown Exudate Color: No No No Foul Odor Drew Cleansing: fter N/Drew N/Drew N/Drew Odor Drew nticipated Due to Product Use: Distinct, outline attached Distinct, outline attached Distinct, outline attached Wound Margin: Large (67-100%) None Present (0%) Large (67-100%) Granulation Drew mount: Red, Pink N/Drew Red, Pink Granulation Quality: None Present (0%) Large (67-100%) None Present (0%) Necrotic Drew mount: N/Drew Adherent Slough N/Drew Necrotic Tissue: Fat Layer (Subcutaneous Tissue): Yes Fascia: No Fat Layer (Subcutaneous Tissue): Yes Exposed Structures: Fascia: No Fat Layer (Subcutaneous Tissue): No Fascia: No Tendon: No  Tendon: No Tendon: No Muscle: No Muscle: No Muscle: No Joint: No Joint: No Joint: No Bone: No Bone: No Bone: No Small (1-33%) None Small (1-33%) Epithelialization: N/Drew Chemical/Enzymatic/Mechanical N/Drew Debridement: N/Drew Lidocaine 4% T opical Solution N/Drew Pain Control: N/Drew N/Drew N/Drew Tissue Debrided: N/Drew N/Drew N/Drew Level: N/Drew N/Drew N/Drew Debridement Drew (sq cm): rea N/Drew N/Drew N/Drew Instrument: N/Drew Minimum N/Drew Bleeding: N/Drew N/Drew N/Drew Procedural Pain: N/Drew N/Drew N/Drew Post Procedural Pain: Debridement Treatment Response: N/Drew Procedure was tolerated well N/Drew Post Debridement Measurements [name] x N/Drew 6.5x5.9x0.2 N/Drew W x D (cm) N/Drew 6.024 N/Drew Post Debridement Volume: (cm) N/Drew Unstageable/Unclassified N/Drew Post Debridement Stage: Excoriation: No Excoriation: No Excoriation: No Periwound Skin  TextureJaksen, [name] ([id number]) [id number] Page 5 of 13 Induration: No Induration: No Induration: No Callus: No Callus: No Callus: No Crepitus: No Crepitus: No Crepitus: No Rash: No Rash: No Rash: No Scarring: No Scarring: No Scarring: No Maceration: No Maceration: No Maceration: No Periwound Skin Moisture: Dry/Scaly: No Dry/Scaly: No Dry/Scaly: No Atrophie Blanche: No Ecchymosis: Yes Atrophie Blanche: No Periwound Skin Color: Cyanosis: No Atrophie Blanche: No Cyanosis: No Ecchymosis: No Cyanosis: No Ecchymosis: No Erythema: No Erythema: No Erythema: No Hemosiderin Staining: No Hemosiderin Staining: No Hemosiderin Staining: No Mottled: No Mottled: No Mottled: No Pallor: No Pallor: No Pallor: No Rubor: No Rubor: No Rubor: No N/Drew Debridement N/Drew Procedures Performed: Wound Number: 4 5 N/Drew Photos: N/Drew Right, Lateral Ankle Right, Plantar Foot N/Drew Wound Location: Pressure Injury Gradually Appeared N/Drew Wounding Event: Pressure Ulcer Pressure Ulcer N/Drew Primary Etiology: Human Immunodeficiency Virus, Human Immunodeficiency Virus, N/Drew Comorbid History: Hypertension, Cirrhosis , Neuropathy, Hypertension, Cirrhosis , Neuropathy, Seizure Disorder, Confinement Anxiety Seizure Disorder, Confinement Anxiety 10/10/2022 10/10/2022 N/Drew Date Acquired: 0 0 N/Drew Weeks of Treatment: Open Open N/Drew Wound Status: No No N/Drew Wound Recurrence: 3.4x2.5x0.7 2.8x5.8x1.5 N/Drew Measurements [name] x W x D (cm) 6.676 12.755 N/Drew Drew (cm) : rea 4.673 19.132 N/Drew Volume (cm) : Category/Stage IV Unstageable/Unclassified N/Drew Classification: Large Medium N/Drew Exudate Drew mount: Serosanguineous Purulent N/Drew Exudate Type: red, brown yellow, brown, green N/Drew Exudate Color: Yes Yes N/Drew Foul Odor Drew Cleansing: fter No No N/Drew Odor Drew nticipated Due to Product Use: Epibole Distinct, outline attached N/Drew Wound Margin: Small (1-33%) None Present (0%)  N/Drew Granulation Drew mount: Pink N/Drew N/Drew Granulation Quality: Large (67-100%) Large (67-100%) N/Drew Necrotic Drew mount: Eschar, Adherent Slough Eschar, Adherent Slough N/Drew Necrotic Tissue: Fat Layer (Subcutaneous Tissue): Yes Fascia: No N/Drew Exposed Structures: Muscle: Yes Fat Layer (Subcutaneous Tissue): No Fascia: No Tendon: No Tendon: No Muscle: No Joint: No Joint: No Bone: No Bone: No None None N/Drew Epithelialization: Debridement - Selective/Open Wound Debridement - Excisional N/Drew Debridement: Pre-procedure Verification/Time Out 09:49 09:49 N/Drew Taken: Lidocaine 4% Topical Solution Lidocaine 4% Topical Solution N/Drew Pain Control: Ambulance person, Subcutaneous, N/Drew Tissue Debrided: Slough Non-Viable Tissue Skin/Subcutaneous Tissue N/Drew Level: 3.92 12.75 N/Drew Debridement Drew (sq cm): rea Blade, Forceps, N/Drew Blade, Forceps, N/Drew N/Drew Instrument: Minimum Minimum N/Drew Bleeding: 0 0 N/Drew Procedural Pain: 0 0 N/Drew Post Procedural Pain: Procedure was tolerated well Procedure was tolerated well N/Drew Debridement Treatment Response: 2.5x2x0.7 2.8x5.8x1.5 N/Drew Post Debridement Measurements [name] x W x D (cm) 2.749 19.132 N/Drew Post Debridement Volume: (cm) Category/Stage IV Unstageable/Unclassified N/Drew Post Debridement Stage: Excoriation: No Excoriation: No N/Drew Periwound Skin Texture: Induration: No Induration: No Callus: No Callus: No Crepitus: No Crepitus: No Rash: No Rash: No Scarring: No Scarring: No Maceration: No Maceration: No N/Drew Periwound Skin Moisture: Dry/Scaly:  No Dry/Scaly: No Atrophie Blanche: No Atrophie Blanche: No N/Drew Periwound Skin Color: Cyanosis: No Cyanosis: No Herter, Hoy [name] ([id number]) [id number] Page 6 of 13 Ecchymosis: No Ecchymosis: No Erythema: No Erythema: No Hemosiderin Staining: No Hemosiderin Staining: No Mottled: No Mottled: No Pallor: No Pallor: No Rubor: No Rubor: No Debridement Debridement  N/Drew Procedures Performed: Treatment Notes Electronic Signature(s) Signed: 02/27/2023 2:02:21 PM By: Geralyn Corwin DO Entered By: Geralyn Corwin on 02/27/2023 10:25:51 -------------------------------------------------------------------------------- Multi-Disciplinary Care Plan Details Patient Name: Date of Service: [name], Drew NTWA NNE [name]. 02/27/2023 8:30 Drew M Medical Record Number: [id number] Patient Account Number: [id number] Date of Birth/Sex: Treating RN: August 22, 1986 (37 y.o. Male) Shawn Stall Primary Care Breann Losano: Fleet Contras Other Clinician: Referring Nicolena Schurman: Treating Jaquay Morneault/Extender: Junious Silk in Treatment: 0 Active Inactive Electronic Signature(s) Signed: 04/11/2023 4:50:38 PM By: Shawn Stall RN, BSN Previous Signature: 02/27/2023 6:19:31 PM Version By: Shawn Stall RN, BSN Entered By: Shawn Stall on 04/11/2023 16:50:38 -------------------------------------------------------------------------------- Pain Assessment Details Patient Name: Date of Service: [name], Drew NTWA NNE [name]. 02/27/2023 8:30 Drew M Medical Record Number: [id number] Patient Account Number: [id number] Date of Birth/Sex: Treating RN: 01/01/1986 (37 y.o. Male) Shawn Stall Primary Care Annisten Manchester: Fleet Contras Other Clinician: Referring Summit Arroyave: Treating Keeley Sussman/Extender: Junious Silk in Treatment: 0 Active Problems Location of Pain Severity and Description of Pain Patient Has Paino Yes Site Locations Pain LocationDELAN, [name] ([id number]) [id number] Page 7 of 13 Pain Location: Pain in Ulcers Rate the pain. Current Pain Level: 8 Pain Management and Medication Current Pain Management: Medication: No Cold Application: No Rest: No Massage: No Activity: No T.E.N.S.: No Heat Application: No Leg drop or elevation: No Is the Current Pain Management Adequate: Adequate How does your wound impact your  activities of daily livingo Sleep: No Bathing: No Appetite: No Relationship With Others: No Bladder Continence: No Emotions: No Bowel Continence: No Work: No Toileting: No Drive: No Dressing: No Hobbies: No Psychologist, prison and probation services) Signed: 02/27/2023 6:19:31 PM By: Shawn Stall RN, BSN Entered By: Shawn Stall on 02/27/2023 09:38:55 -------------------------------------------------------------------------------- Patient/Caregiver Education Details Patient Name: Date of Service: [name], Drew NTWA NNE [name]. 5/20/2024andnbsp8:30 Drew M Medical Record Number: [id number] Patient Account Number: [id number] Date of Birth/Gender: Treating RN: January 26, 1986 (37 y.o. Male) Shawn Stall Primary Care Physician: Fleet Contras Other Clinician: Referring Physician: Treating Physician/Extender: Junious Silk in Treatment: 0 Education Assessment Education Provided To: Patient Education Topics Provided Welcome T [facility] Packet: o Handouts: [facility] P[facility] form, Welcome T [facility] o Methods: Explain/Verbal Responses: Reinforcements needed Electronic Signature(s) Signed: 02/27/2023 6:19:31 PM By: Shawn Stall RN, BSN Entered By: Shawn Stall on 02/27/2023 12:04:12 Lucchetti, [name] ([id number]) [id number] Page 8 of 13 -------------------------------------------------------------------------------- Wound Assessment Details Patient Name: Date of Service: [name] NTWA NNE [name]. 02/27/2023 8:30 Drew M Medical Record Number: [id number] Patient Account Number: [id number] Date of Birth/Sex: Treating RN: 08/09/1986 (37 y.o. Male) Shawn Stall Primary Care Timesha Cervantez: Fleet Contras Other Clinician: Referring Averie Hornbaker: Treating Zeyna Mkrtchyan/Extender: Junious Silk in Treatment: 0 Wound Status Wound Number: 1 Primary Pressure Ulcer Etiology: Wound Location: Sacrum Wound Open Wounding  Event: Pressure Injury Status: Date Acquired: 10/10/2022 Comorbid Human Immunodeficiency Virus, Hypertension, Cirrhosis , Weeks Of Treatment: 0 History: Neuropathy, Seizure Disorder, Confinement Anxiety Clustered Wound: No Photos Wound Measurements Length: (cm) 0. Width: (cm) 0. Depth: (cm) 0. Area: (cm) 0 Volume: (cm) 0 5 % Reduction in Area:  5 % Reduction in Volume: 4 Epithelialization: Small (1-33%) .196 Tunneling: No .079 Undermining: No Wound Description Classification: Category/Stage III Wound Margin: Distinct, outline attached Exudate Amount: Medium Exudate Type: Serosanguineous Exudate Color: red, brown Foul Odor After Cleansing: No Slough/Fibrino No Wound Bed Granulation Amount: Large (67-100%) Exposed Structure Granulation Quality: Red, Pink Fascia Exposed: No Necrotic Amount: None Present (0%) Fat Layer (Subcutaneous Tissue) Exposed: Yes Tendon Exposed: No Muscle Exposed: No Joint Exposed: No Bone Exposed: No Periwound Skin Texture Texture Color No Abnormalities Noted: No No Abnormalities Noted: No Callus: No Atrophie Blanche: No Crepitus: No Cyanosis: No Excoriation: No Ecchymosis: No Induration: No Erythema: No Rash: No Hemosiderin Staining: No Scarring: No Mottled: No Pallor: No Moisture Rubor: No No Abnormalities Noted: No Dry / Scaly: No Maceration: No Casto, Lux [name] ([id number]) [id number] Page 9 of 13 Electronic Signature(s) Signed: 02/27/2023 6:19:31 PM By: Shawn Stall RN, BSN Signed: 03/03/2023 11:53:23 AM By: Thayer Dallas Entered By: Thayer Dallas on 02/27/2023 09:32:36 -------------------------------------------------------------------------------- Wound Assessment Details Patient Name: Date of Service: [name], Drew NTWA NNE [name]. 02/27/2023 8:30 Drew M Medical Record Number: [id number] Patient Account Number: [id number] Date of Birth/Sex: Treating RN: 08-Jun-1986 (37 y.o. Male) Shawn Stall Primary Care  Mahoganie Basher: Fleet Contras Other Clinician: Referring Kierstynn Babich: Treating Yanis Juma/Extender: Junious Silk in Treatment: 0 Wound Status Wound Number: 2 Primary Pressure Ulcer Etiology: Wound Location: Left Trochanter Wound Open Wounding Event: Pressure Injury Status: Date Acquired: 12/09/2022 Comorbid Human Immunodeficiency Virus, Hypertension, Cirrhosis , Weeks Of Treatment: 0 History: Neuropathy, Seizure Disorder, Confinement Anxiety Clustered Wound: No Photos Wound Measurements Length: (cm) 6.5 Width: (cm) 5.9 Depth: (cm) 0.2 Area: (cm) 30.12 Volume: (cm) 6.024 % Reduction in Area: % Reduction in Volume: Epithelialization: None Tunneling: No Undermining: No Wound Description Classification: Unstageable/Unclassified Wound Margin: Distinct, outline attached Exudate Amount: Medium Exudate Type: Serosanguineous Exudate Color: red, brown Foul Odor After Cleansing: No Slough/Fibrino Yes Wound Bed Granulation Amount: None Present (0%) Exposed Structure Necrotic Amount: Large (67-100%) Fascia Exposed: No Necrotic Quality: Adherent Slough Fat Layer (Subcutaneous Tissue) Exposed: No Tendon Exposed: No Muscle Exposed: No Joint Exposed: No Bone Exposed: No Periwound Skin Texture Texture Color No Abnormalities Noted: No No Abnormalities Noted: No Callus: No Atrophie Blanche: No Crepitus: No Cyanosis: No [name], [name] ([id number]) [id number] Page 10 of 13 Excoriation: No Ecchymosis: Yes Induration: No Erythema: No Rash: No Hemosiderin Staining: No Scarring: No Mottled: No Pallor: No Moisture Rubor: No No Abnormalities Noted: No Dry / Scaly: No Maceration: No Electronic Signature(s) Signed: 02/27/2023 6:19:31 PM By: Shawn Stall RN, BSN Signed: 03/03/2023 11:53:23 AM By: Thayer Dallas Entered By: Thayer Dallas on 02/27/2023  09:33:04 -------------------------------------------------------------------------------- Wound Assessment Details Patient Name: Date of Service: [name], Drew NTWA NNE [name]. 02/27/2023 8:30 Drew M Medical Record Number: [id number] Patient Account Number: [id number] Date of Birth/Sex: Treating RN: Apr 07, 1986 (37 y.o. Male) Shawn Stall Primary Care Wilbert Schouten: Fleet Contras Other Clinician: Referring Deaun Rocha: Treating Tiffane Sheldon/Extender: Junious Silk in Treatment: 0 Wound Status Wound Number: 3 Primary Pressure Ulcer Etiology: Wound Location: Right, Medial Knee Wound Open Wounding Event: Pressure Injury Status: Date Acquired: 12/12/2022 Comorbid Human Immunodeficiency Virus, Hypertension, Cirrhosis , Weeks Of Treatment: 0 History: Neuropathy, Seizure Disorder, Confinement Anxiety Clustered Wound: No Photos Wound Measurements Length: (cm) 3.7 Width: (cm) 2 Depth: (cm) 0.1 Area: (cm) 5.812 Volume: (cm) 0.581 % Reduction in Area: % Reduction in Volume: Epithelialization: Small (1-33%) Tunneling: No Undermining: No Wound Description Classification: Category/Stage III Wound Margin: Distinct, outline attached  Exudate Amount: Medium Exudate Type: Serosanguineous Exudate Color: red, brown Foul Odor After Cleansing: No Slough/Fibrino No Wound Bed Granulation Amount: Large (67-100%) Exposed Structure Granulation Quality: Red, Pink Fascia Exposed: No Necrotic Amount: None Present (0%) Fat Layer (Subcutaneous Tissue) Exposed: Yes Tendon Exposed: No Muscle Exposed: No [name], [name] ([id number]) [id number] Page 11 of 13 Joint Exposed: No Bone Exposed: No Periwound Skin Texture Texture Color No Abnormalities Noted: No No Abnormalities Noted: No Callus: No Atrophie Blanche: No Crepitus: No Cyanosis: No Excoriation: No Ecchymosis: No Induration: No Erythema: No Rash: No Hemosiderin Staining: No Scarring: No Mottled:  No Pallor: No Moisture Rubor: No No Abnormalities Noted: No Dry / Scaly: No Maceration: No Electronic Signature(s) Signed: 02/27/2023 6:19:31 PM By: Shawn Stall RN, BSN Signed: 03/03/2023 11:53:23 AM By: Thayer Dallas Entered By: Thayer Dallas on 02/27/2023 09:34:07 -------------------------------------------------------------------------------- Wound Assessment Details Patient Name: Date of Service: [name], Drew NTWA NNE [name]. 02/27/2023 8:30 Drew M Medical Record Number: [id number] Patient Account Number: [id number] Date of Birth/Sex: Treating RN: 1986/04/06 (37 y.o. Male) Shawn Stall Primary Care Quatavious Rossa: Fleet Contras Other Clinician: Referring Slayden Mennenga: Treating Levada Bowersox/Extender: Junious Silk in Treatment: 0 Wound Status Wound Number: 4 Primary Pressure Ulcer Etiology: Wound Location: Right, Lateral Ankle Wound Open Wounding Event: Pressure Injury Status: Date Acquired: 10/10/2022 Comorbid Human Immunodeficiency Virus, Hypertension, Cirrhosis , Weeks Of Treatment: 0 History: Neuropathy, Seizure Disorder, Confinement Anxiety Clustered Wound: No Photos Wound Measurements Length: (cm) 3.4 Width: (cm) 2.5 Depth: (cm) 0.7 Area: (cm) 6.676 Volume: (cm) 4.673 % Reduction in Area: % Reduction in Volume: Epithelialization: None Tunneling: No Undermining: No Wound Description Classification: Category/Stage IV Wound Margin: Epibole Exudate Amount: Large Exudate Type: Serosanguineous [name], [name] ([id number]) Exudate Color: red, brown Foul Odor After Cleansing: Yes Due to Product Use: No Slough/Fibrino Yes [id number] Page 12 of 13 Wound Bed Granulation Amount: Small (1-33%) Exposed Structure Granulation Quality: Pink Fascia Exposed: No Necrotic Amount: Large (67-100%) Fat Layer (Subcutaneous Tissue) Exposed: Yes Necrotic Quality: Eschar, Adherent Slough Tendon Exposed: No Muscle Exposed: Yes Necrosis of  Muscle: No Joint Exposed: No Bone Exposed: No Periwound Skin Texture Texture Color No Abnormalities Noted: No No Abnormalities Noted: No Callus: No Atrophie Blanche: No Crepitus: No Cyanosis: No Excoriation: No Ecchymosis: No Induration: No Erythema: No Rash: No Hemosiderin Staining: No Scarring: No Mottled: No Pallor: No Moisture Rubor: No No Abnormalities Noted: No Dry / Scaly: No Maceration: No Electronic Signature(s) Signed: 02/27/2023 6:19:31 PM By: Shawn Stall RN, BSN Signed: 03/03/2023 11:53:23 AM By: Thayer Dallas Entered By: Thayer Dallas on 02/27/2023 09:34:45 -------------------------------------------------------------------------------- Wound Assessment Details Patient Name: Date of Service: [name], Drew NTWA NNE [name]. 02/27/2023 8:30 Drew M Medical Record Number: [id number] Patient Account Number: [id number] Date of Birth/Sex: Treating RN: July 26, 1986 (37 y.o. Male) Shawn Stall Primary Care Tracee Mccreery: Fleet Contras Other Clinician: Referring Zowie Lundahl: Treating Arvid Marengo/Extender: Junious Silk in Treatment: 0 Wound Status Wound Number: 5 Primary Pressure Ulcer Etiology: Wound Location: Right, Plantar Foot Wound Open Wounding Event: Gradually Appeared Status: Date Acquired: 10/10/2022 Comorbid Human Immunodeficiency Virus, Hypertension, Cirrhosis , Weeks Of Treatment: 0 History: Neuropathy, Seizure Disorder, Confinement Anxiety Clustered Wound: No Photos Wound Measurements Length: (cm) 2.8 Width: (cm) 5.8 [name], [name] ([id number]) Depth: (cm) 1.5 Area: (cm) 12.7 Volume: (cm) 19.1 % Reduction in Area: % Reduction in Volume: [id number] Page 13 of 13 Epithelialization: None 55 Tunneling: No 32 Undermining: No Wound Description Classification: Unstageable/Unclassified Wound Margin: Distinct, outline attached Exudate Amount: Medium Exudate  Type: Purulent Exudate Color: yellow, brown,  green Foul Odor After Cleansing: Yes Due to Product Use: No Slough/Fibrino Yes Wound Bed Granulation Amount: None Present (0%) Exposed Structure Necrotic Amount: Large (67-100%) Fascia Exposed: No Necrotic Quality: Eschar, Adherent Slough Fat Layer (Subcutaneous Tissue) Exposed: No Tendon Exposed: No Muscle Exposed: No Joint Exposed: No Bone Exposed: No Periwound Skin Texture Texture Color No Abnormalities Noted: No No Abnormalities Noted: No Callus: No Atrophie Blanche: No Crepitus: No Cyanosis: No Excoriation: No Ecchymosis: No Induration: No Erythema: No Rash: No Hemosiderin Staining: No Scarring: No Mottled: No Pallor: No Moisture Rubor: No No Abnormalities Noted: No Dry / Scaly: No Maceration: No Electronic Signature(s) Signed: 02/27/2023 6:19:31 PM By: Shawn Stall RN, BSN Signed: 03/03/2023 11:53:23 AM By: Thayer Dallas Entered By: Thayer Dallas on 02/27/2023 09:35:19 -------------------------------------------------------------------------------- Vitals Details Patient Name: Date of Service: [name], Drew NTWA NNE [name]. 02/27/2023 8:30 Drew M Medical Record Number: [id number] Patient Account Number: [id number] Date of Birth/Sex: Treating RN: 18-Nov-1985 (37 y.o. Male) Primary Care Elinda Bunten: Fleet Contras Other Clinician: Referring Adylin Hankey: Treating Natsha Guidry/Extender: Junious Silk in Treatment: 0 Vital Signs Time Taken: 08:48 Temperature (F): 97.9 Height (in): 75 Pulse (bpm): 120 Source: Stated Respiratory Rate (breaths/min): 18 Weight (lbs): 147 Blood Pressure (mmHg): 107/75 Source: Stated Reference Range: 80 - 120 mg / dl Body Mass Index (BMI): 18.4 Electronic Signature(s) Signed: 03/03/2023 11:53:23 AM By: Thayer Dallas Entered By: Thayer Dallas on 02/27/2023 09:03:39

## 2023-06-09 ENCOUNTER — Other Ambulatory Visit (HOSPITAL_COMMUNITY): Payer: Self-pay

## 2023-09-10 IMAGING — DX DG ABDOMEN 1V
1 series · 1 of 1 positions shown · non-contrast
Comparison: 10/11/2021

CLINICAL DATA: NG tube placement

EXAM:
ABDOMEN - 1 VIEW

[abdomen kub]
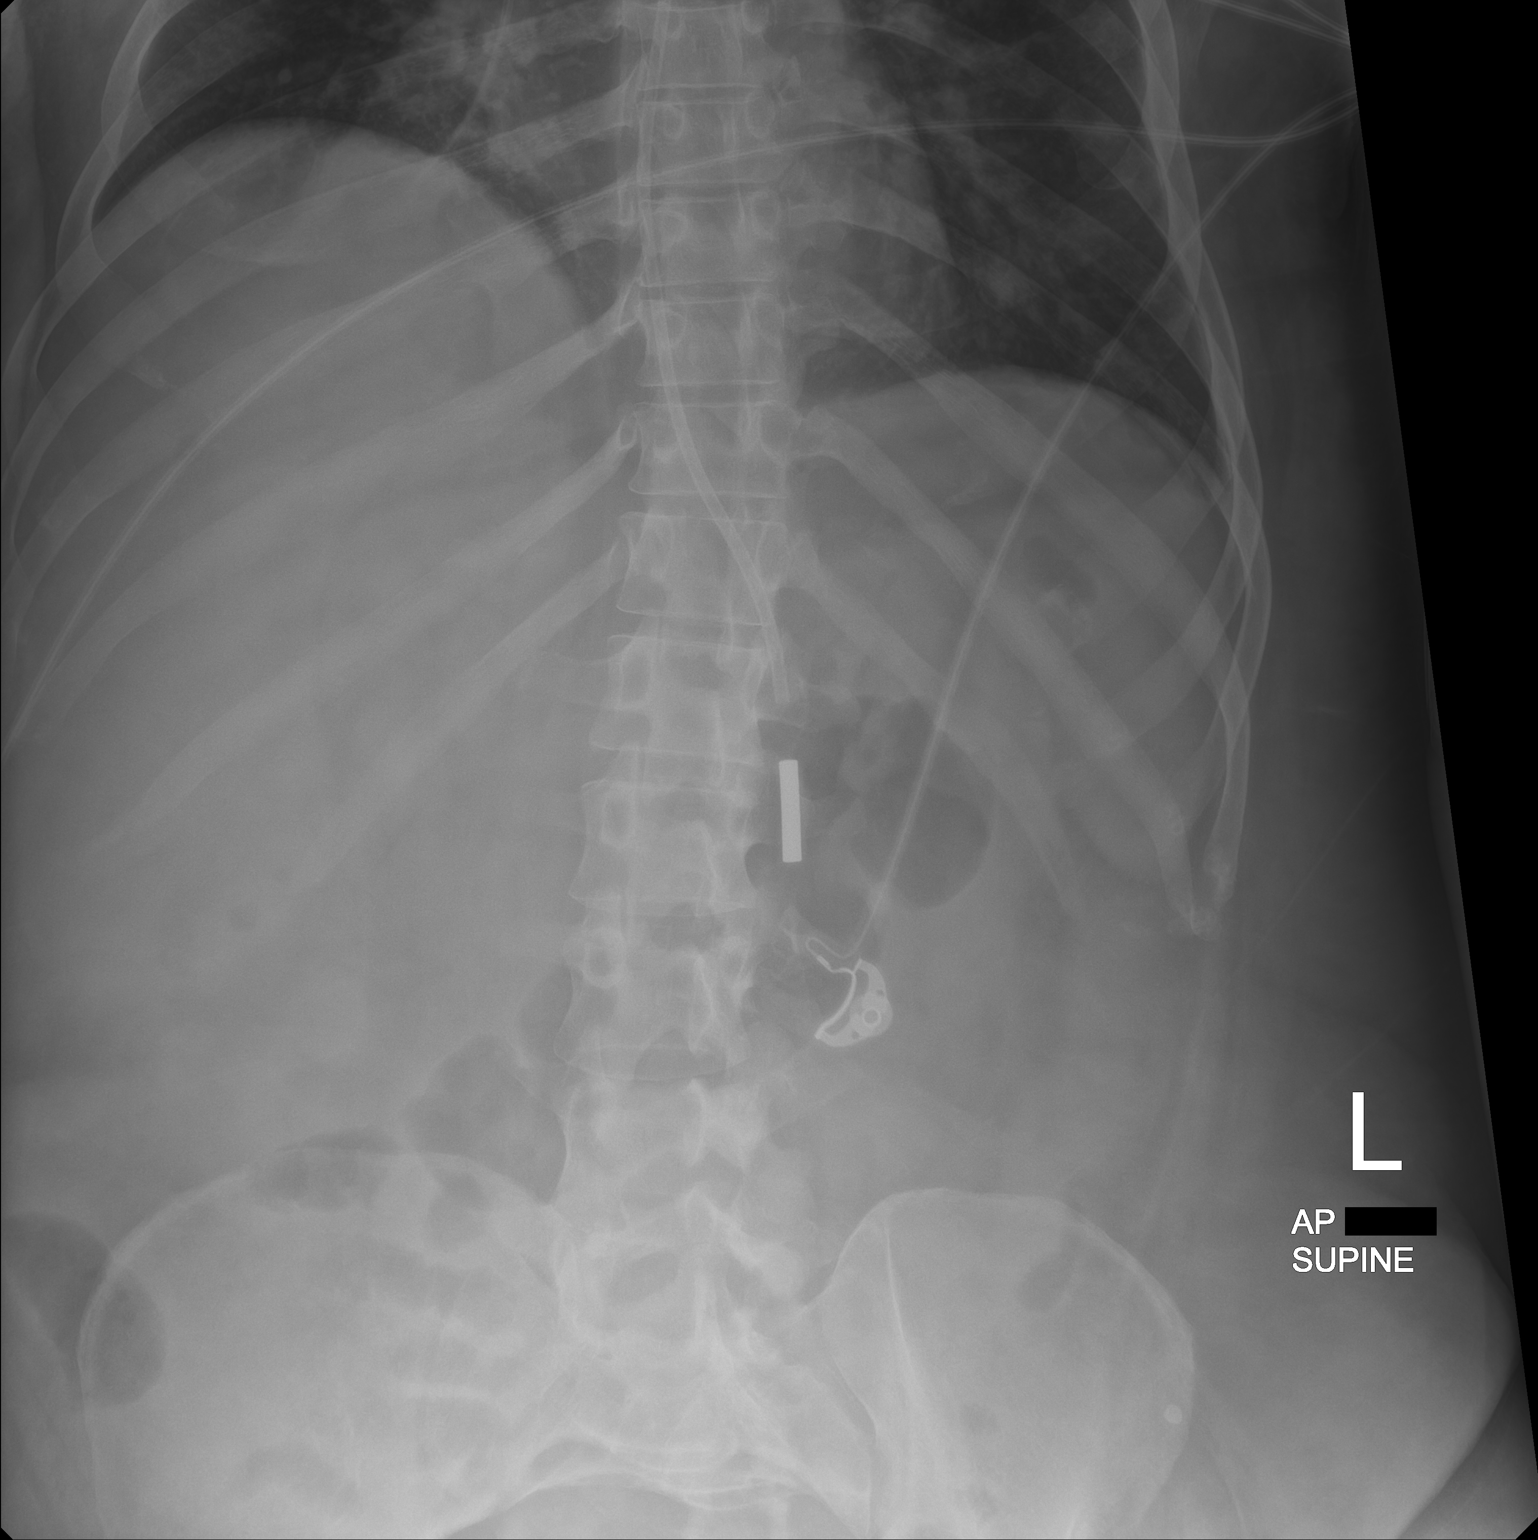

[1 of 1 positions shown; findings below may reference images not displayed]

FINDINGS: Unchanged position of weighted enteric feeding tube, tip below the
diaphragm, projecting in the vicinity of the gastric body. Metallic
stylette has been removed. Nonobstructive pattern of included bowel
gas. No obvious free air on supine radiograph.
IMPRESSION: Unchanged position of weighted enteric feeding tube, tip below the
diaphragm, projecting in the vicinity of the gastric body. Metallic
stylette has been removed.

## 2023-09-10 IMAGING — DX DG ABDOMEN 1V
1 series · 1 of 1 positions shown · non-contrast
Comparison: [DATE] a.m.

CLINICAL DATA: Nasoenteric feeding tube placement

EXAM:
ABDOMEN - 1 VIEW

[abdomen kub]
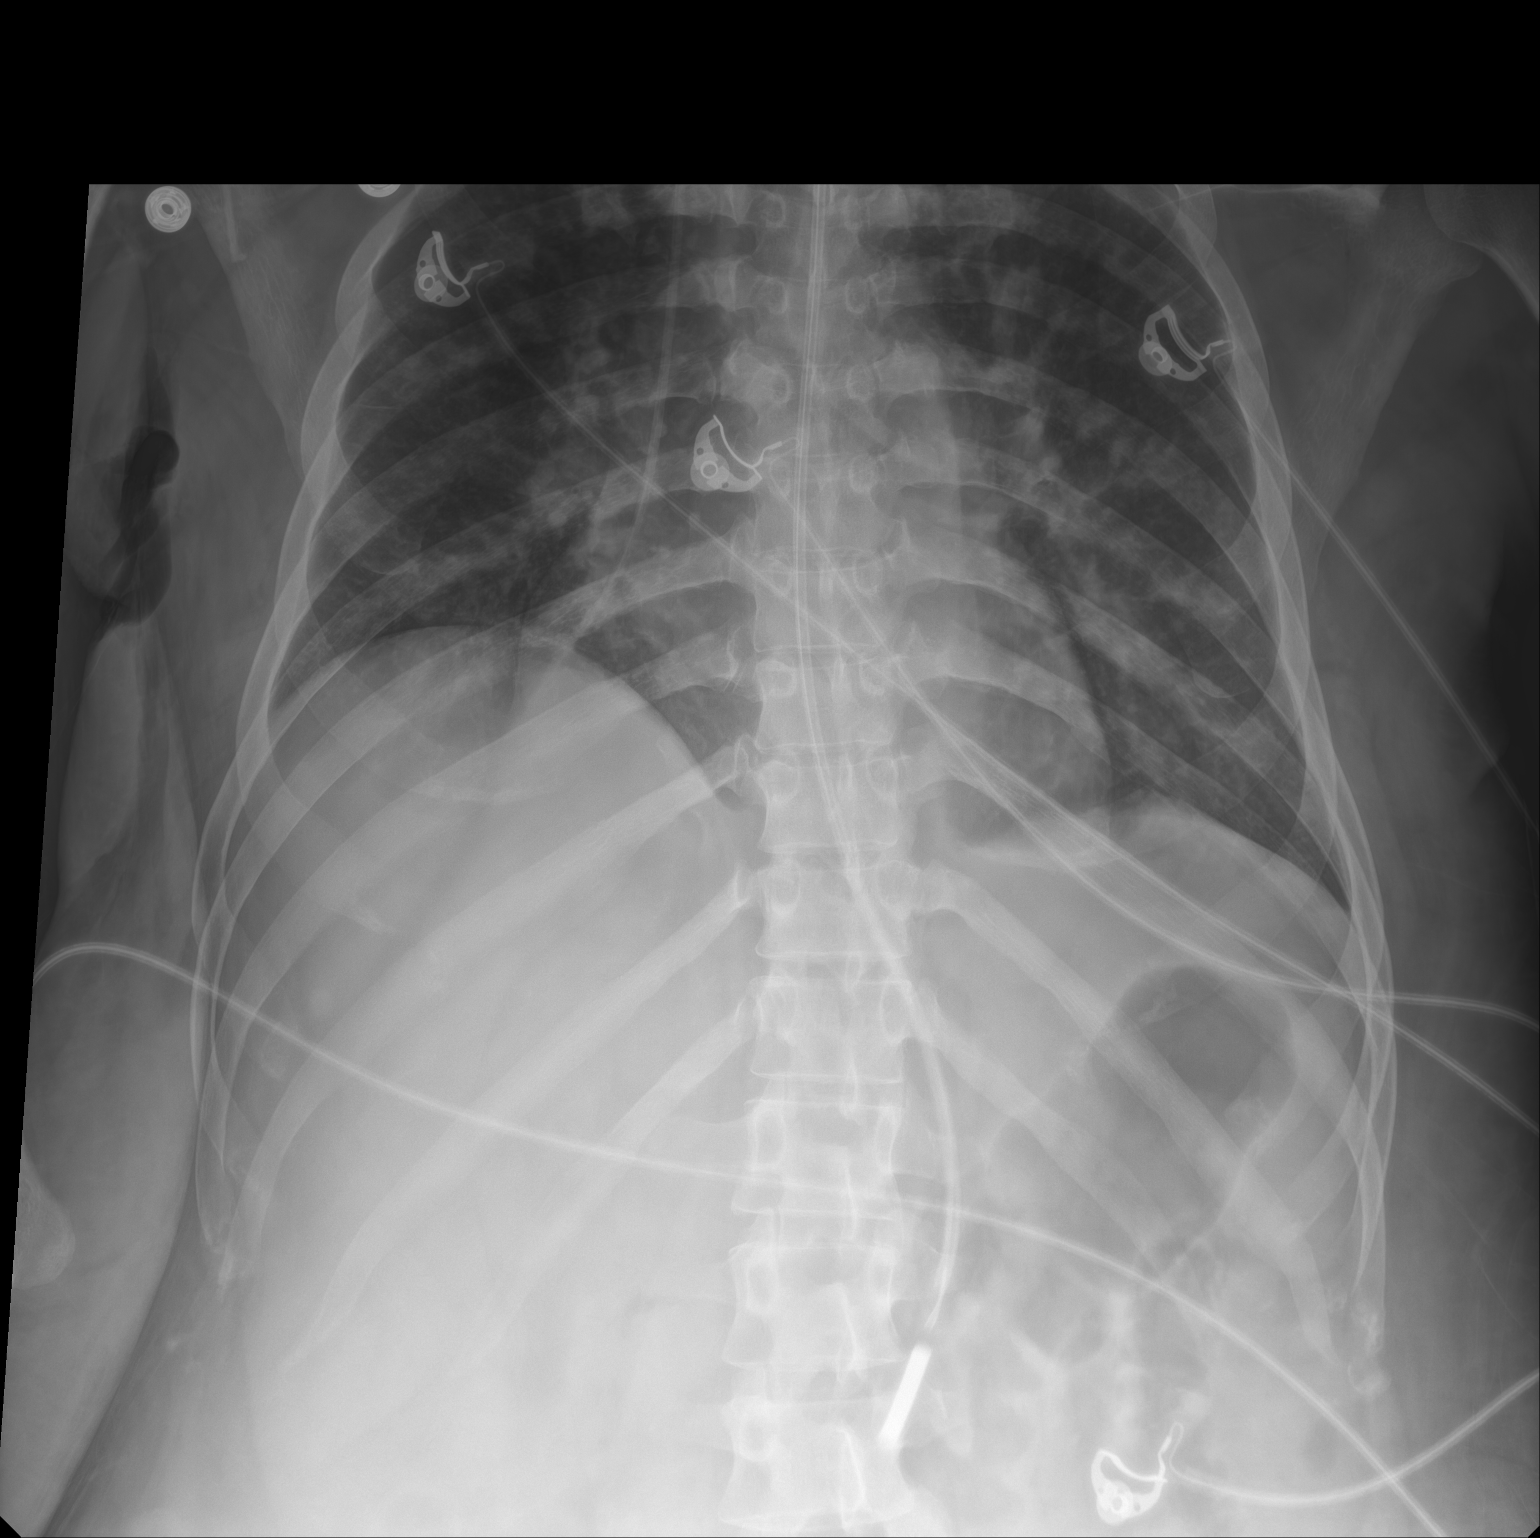

[1 of 1 positions shown; findings below may reference images not displayed]

FINDINGS: Nasoenteric feeding tube tip extends into the expected mid to distal
body of the stomach. Visualized abdominal gas pattern is
unremarkable. Multifocal pulmonary infiltrates noted. Right internal
jugular central venous catheter tip noted within the right atrium.
IMPRESSION: Nasoenteric feeding tube tip within the mid to distal body of the
stomach.

## 2023-09-24 IMAGING — DX DG CHEST 1V PORT
1 series · 1 of 1 positions shown · non-contrast
Comparison: Chest x-ray 10/13/2021.

CLINICAL DATA: Aspiration into airway.

EXAM:
PORTABLE CHEST 1 VIEW

[chest ap]
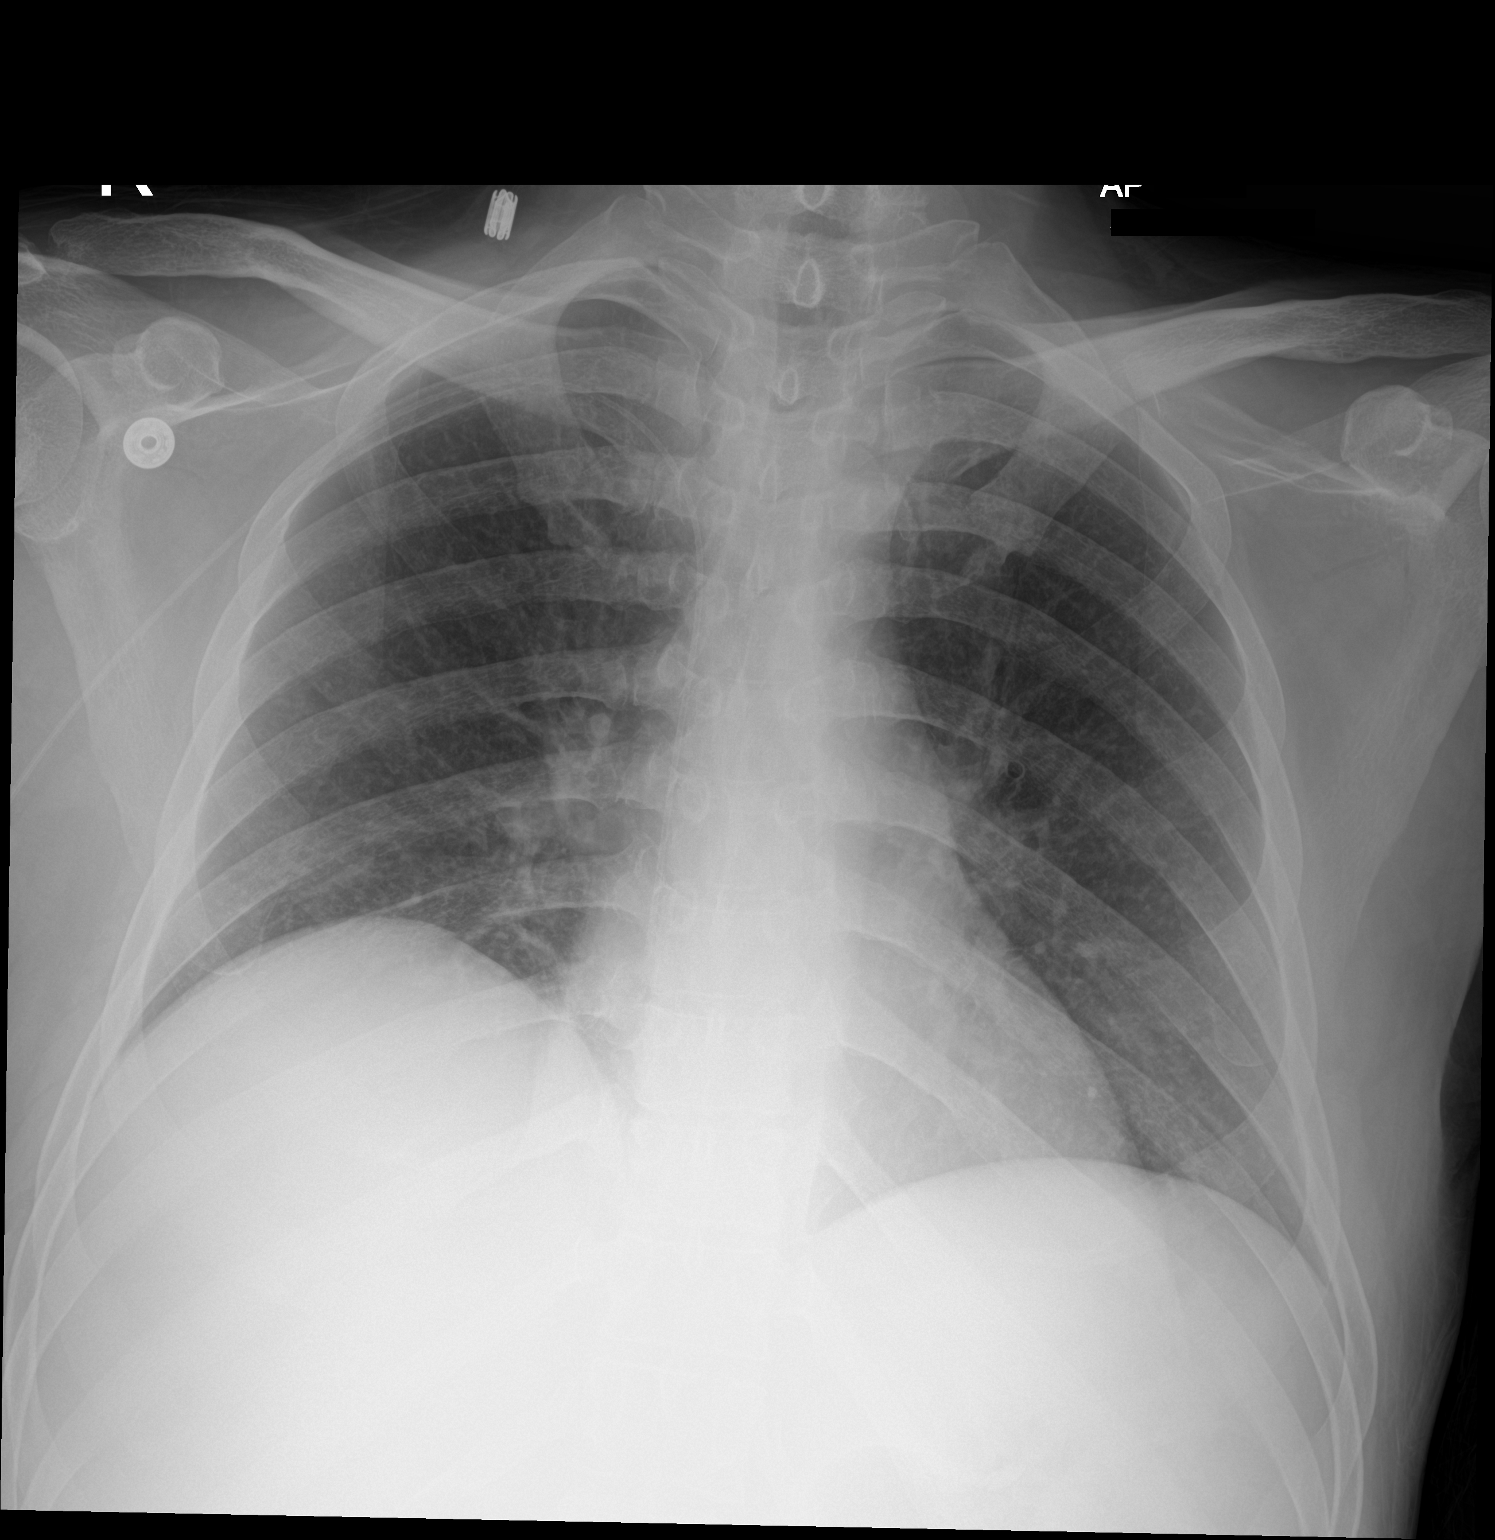

[1 of 1 positions shown; findings below may reference images not displayed]

FINDINGS: Right-sided central venous catheter tip projects over the distal
SVC, unchanged. The lungs and costophrenic angles are clear. There
is no pneumothorax. Cardiomediastinal silhouette is within normal
limits. No acute fractures are seen.
IMPRESSION: 1. No acute cardiopulmonary process.
